# Patient Record
Sex: Male | Born: 1945 | Race: White | Hispanic: No | Marital: Married | Smoking: Former smoker
Health system: Southern US, Community
[De-identification: ages and names within clinical notes are randomized; demographics above are authoritative.]

## PROBLEM LIST (undated history)

## (undated) ENCOUNTER — Emergency Department (HOSPITAL_COMMUNITY): Admission: EM | Discharge status: Against Medical Advice | Payer: Medicare Other

## (undated) DIAGNOSIS — M4712 Other spondylosis with myelopathy, cervical region: Secondary | ICD-10-CM

## (undated) DIAGNOSIS — Z7901 Long term (current) use of anticoagulants: Secondary | ICD-10-CM

## (undated) DIAGNOSIS — I2510 Atherosclerotic heart disease of native coronary artery without angina pectoris: Secondary | ICD-10-CM

## (undated) DIAGNOSIS — I219 Acute myocardial infarction, unspecified: Secondary | ICD-10-CM

## (undated) DIAGNOSIS — T8859XA Other complications of anesthesia, initial encounter: Secondary | ICD-10-CM

## (undated) DIAGNOSIS — I10 Essential (primary) hypertension: Secondary | ICD-10-CM

## (undated) DIAGNOSIS — G893 Neoplasm related pain (acute) (chronic): Secondary | ICD-10-CM

## (undated) DIAGNOSIS — M1990 Unspecified osteoarthritis, unspecified site: Secondary | ICD-10-CM

## (undated) DIAGNOSIS — I495 Sick sinus syndrome: Secondary | ICD-10-CM

## (undated) DIAGNOSIS — Z96641 Presence of right artificial hip joint: Secondary | ICD-10-CM

## (undated) DIAGNOSIS — C9110 Chronic lymphocytic leukemia of B-cell type not having achieved remission: Secondary | ICD-10-CM

## (undated) DIAGNOSIS — I5022 Chronic systolic (congestive) heart failure: Secondary | ICD-10-CM

## (undated) DIAGNOSIS — Z87891 Personal history of nicotine dependence: Secondary | ICD-10-CM

## (undated) DIAGNOSIS — I455 Other specified heart block: Secondary | ICD-10-CM

## (undated) DIAGNOSIS — Z77098 Contact with and (suspected) exposure to other hazardous, chiefly nonmedicinal, chemicals: Secondary | ICD-10-CM

## (undated) DIAGNOSIS — B019 Varicella without complication: Secondary | ICD-10-CM

## (undated) DIAGNOSIS — I255 Ischemic cardiomyopathy: Secondary | ICD-10-CM

## (undated) DIAGNOSIS — G4733 Obstructive sleep apnea (adult) (pediatric): Secondary | ICD-10-CM

## (undated) DIAGNOSIS — Z9221 Personal history of antineoplastic chemotherapy: Secondary | ICD-10-CM

## (undated) DIAGNOSIS — N400 Enlarged prostate without lower urinary tract symptoms: Secondary | ICD-10-CM

## (undated) DIAGNOSIS — Z79899 Other long term (current) drug therapy: Secondary | ICD-10-CM

## (undated) DIAGNOSIS — E7800 Pure hypercholesterolemia, unspecified: Secondary | ICD-10-CM

## (undated) DIAGNOSIS — E785 Hyperlipidemia, unspecified: Secondary | ICD-10-CM

## (undated) DIAGNOSIS — F419 Anxiety disorder, unspecified: Secondary | ICD-10-CM

## (undated) DIAGNOSIS — Z9989 Dependence on other enabling machines and devices: Secondary | ICD-10-CM

## (undated) DIAGNOSIS — I00 Rheumatic fever without heart involvement: Secondary | ICD-10-CM

## (undated) DIAGNOSIS — I639 Cerebral infarction, unspecified: Secondary | ICD-10-CM

## (undated) DIAGNOSIS — I4891 Unspecified atrial fibrillation: Secondary | ICD-10-CM

## (undated) DIAGNOSIS — I499 Cardiac arrhythmia, unspecified: Secondary | ICD-10-CM

## (undated) DIAGNOSIS — K219 Gastro-esophageal reflux disease without esophagitis: Secondary | ICD-10-CM

## (undated) DIAGNOSIS — H9190 Unspecified hearing loss, unspecified ear: Secondary | ICD-10-CM

## (undated) DIAGNOSIS — Z7739 Contact with and (suspected) exposure to other war theater: Secondary | ICD-10-CM

## (undated) DIAGNOSIS — E876 Hypokalemia: Secondary | ICD-10-CM

## (undated) DIAGNOSIS — J449 Chronic obstructive pulmonary disease, unspecified: Secondary | ICD-10-CM

## (undated) DIAGNOSIS — T4145XA Adverse effect of unspecified anesthetic, initial encounter: Secondary | ICD-10-CM

## (undated) DIAGNOSIS — F4310 Post-traumatic stress disorder, unspecified: Secondary | ICD-10-CM

## (undated) DIAGNOSIS — Z950 Presence of cardiac pacemaker: Secondary | ICD-10-CM

## (undated) HISTORY — DX: Ischemic cardiomyopathy: I25.5

## (undated) HISTORY — DX: Chronic systolic (congestive) heart failure: I50.22

## (undated) HISTORY — DX: Unspecified osteoarthritis, unspecified site: M19.90

## (undated) HISTORY — DX: Pure hypercholesterolemia, unspecified: E78.00

## (undated) HISTORY — DX: Cerebral infarction, unspecified: I63.9

## (undated) HISTORY — DX: Sick sinus syndrome: I49.5

## (undated) HISTORY — DX: Hyperlipidemia, unspecified: E78.5

## (undated) HISTORY — PX: ABDOMINAL HERNIA REPAIR: SHX539

## (undated) HISTORY — DX: Chronic lymphocytic leukemia of B-cell type not having achieved remission: C91.10

## (undated) HISTORY — DX: Unspecified atrial fibrillation: I48.91

## (undated) HISTORY — PX: CARDIAC CATHETERIZATION: SHX172

## (undated) HISTORY — DX: Atherosclerotic heart disease of native coronary artery without angina pectoris: I25.10

## (undated) HISTORY — DX: Varicella without complication: B01.9

## (undated) HISTORY — PX: INGUINAL HERNIA REPAIR: SUR1180

## (undated) HISTORY — DX: Neoplasm related pain (acute) (chronic): G89.3

## (undated) HISTORY — DX: Chronic obstructive pulmonary disease, unspecified: J44.9

## (undated) HISTORY — DX: Presence of right artificial hip joint: Z96.641

## (undated) HISTORY — PX: INSERT / REPLACE / REMOVE PACEMAKER: SUR710

## (undated) HISTORY — DX: Rheumatic fever without heart involvement: I00

## (undated) HISTORY — PX: LAPAROSCOPIC CHOLECYSTECTOMY: SUR755

## (undated) HISTORY — DX: Essential (primary) hypertension: I10

## (undated) MED FILL — Venetoclax Tab 100 MG: ORAL | Fill #0 | Status: CN

## (undated) MED FILL — Medication: Fill #0 | Status: CN

---
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
## 1966-07-21 HISTORY — PX: FOREIGN BODY REMOVAL: SHX962

## 1985-06-21 HISTORY — PX: APPENDECTOMY: SHX54

## 2005-01-20 ENCOUNTER — Ambulatory Visit: Payer: Self-pay | Admitting: Surgery

## 2006-05-27 ENCOUNTER — Ambulatory Visit: Payer: Self-pay | Admitting: General Practice

## 2006-05-27 IMAGING — US ABDOMEN ULTRASOUND
1 series · 17 of 25 positions shown · non-contrast
Comparison: none

REASON FOR EXAM: epigastric pain
COMMENTS:

[Series 1: abdomen ultrasound · 17 of 63 slices shown]
[im 1/63]
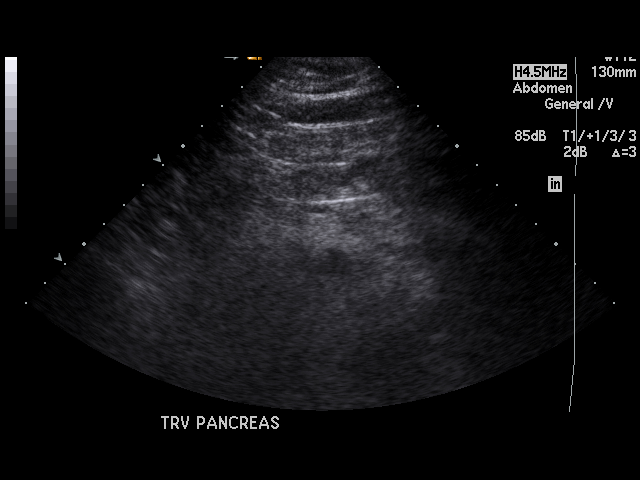
[im 6/63]
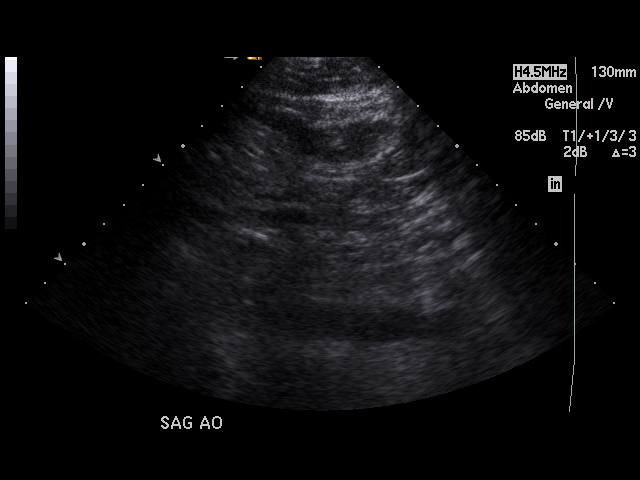
[im 8/63]
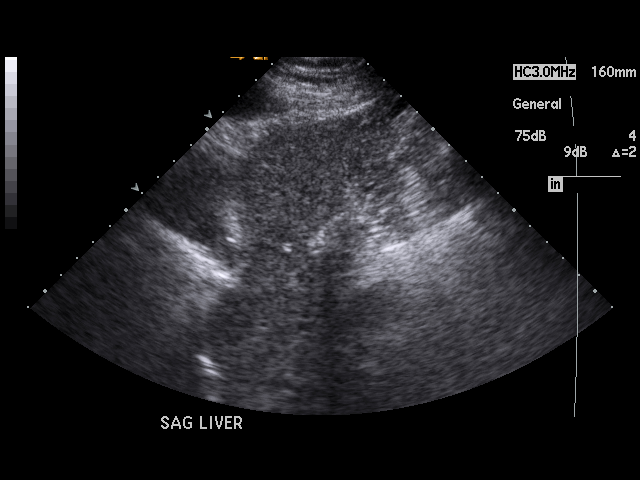
[im 13/63]
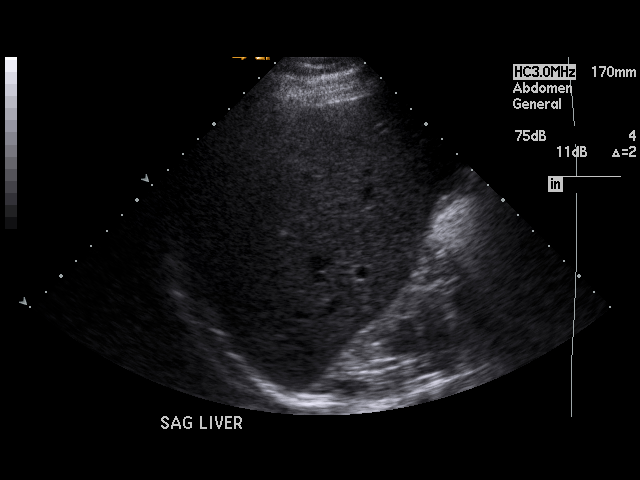
[im 16/63]
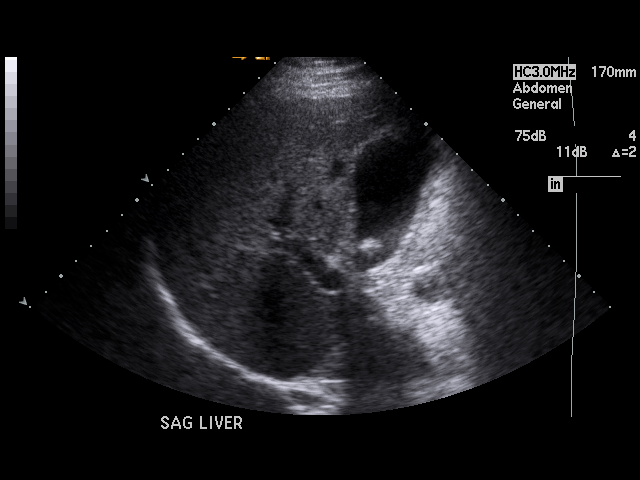
[im 21/63]
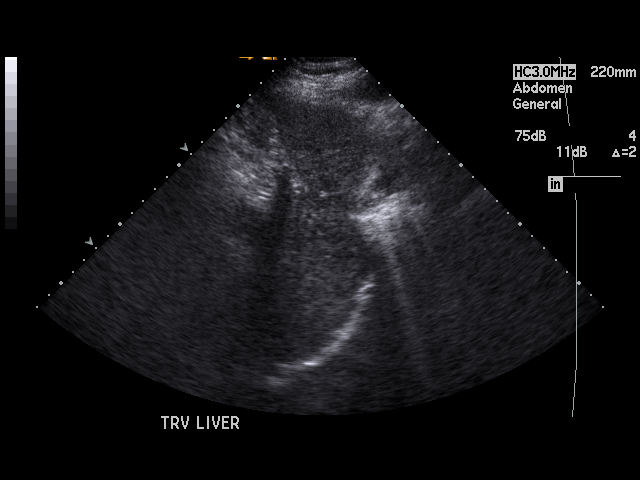
[im 24/63]
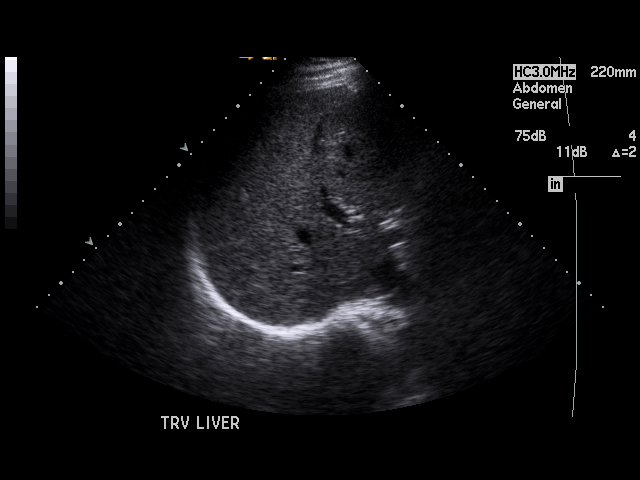
[im 29/63]
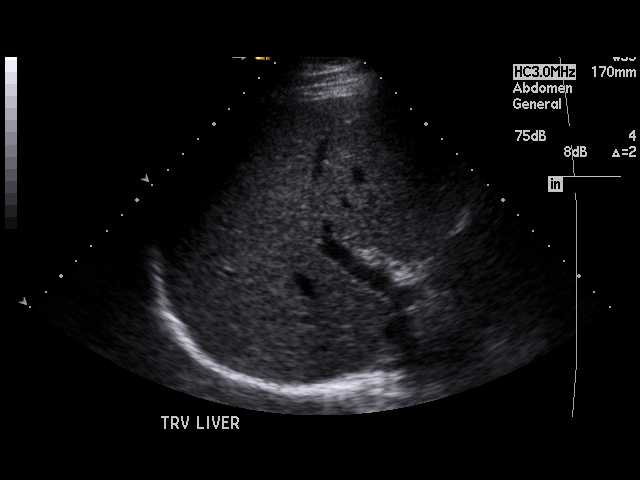
[im 32/63]
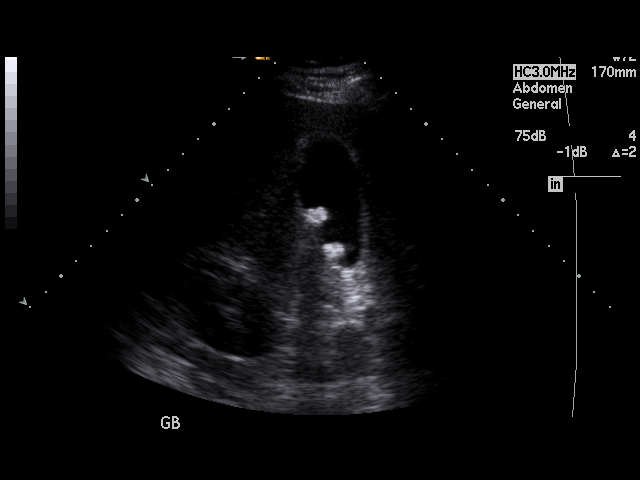
[im 34/63]
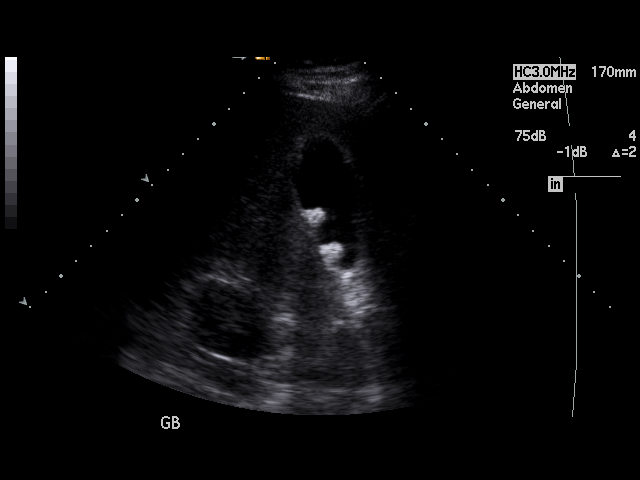
[im 39/63]
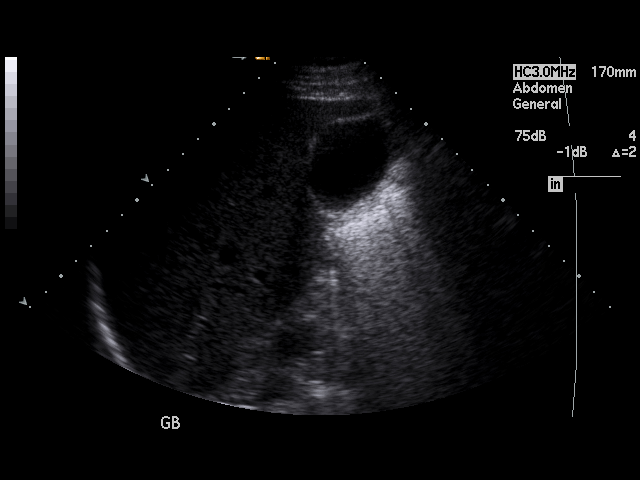
[im 42/63]
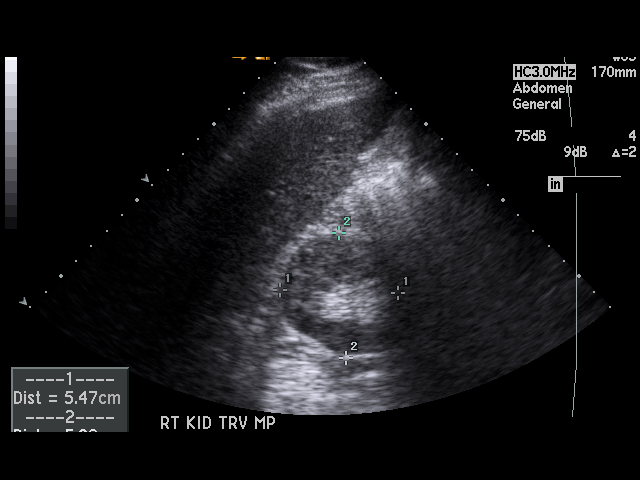
[im 47/63]
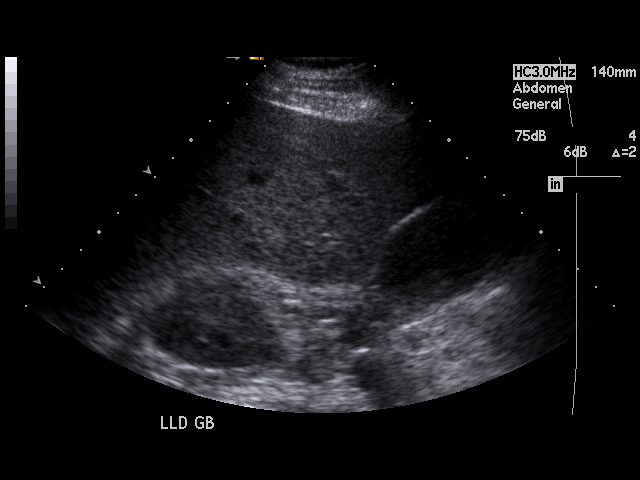
[im 50/63]
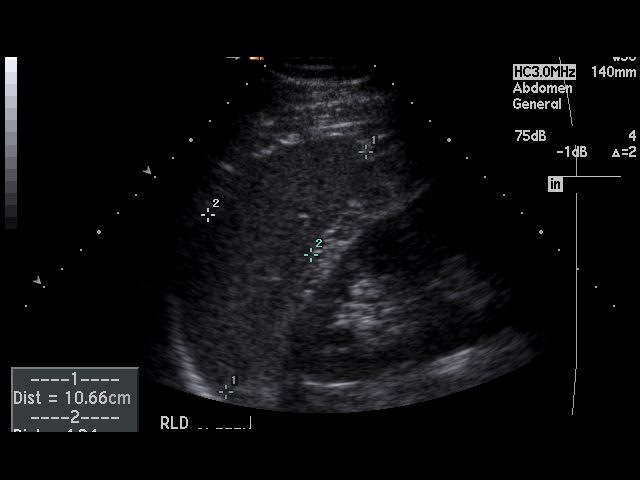
[im 55/63]
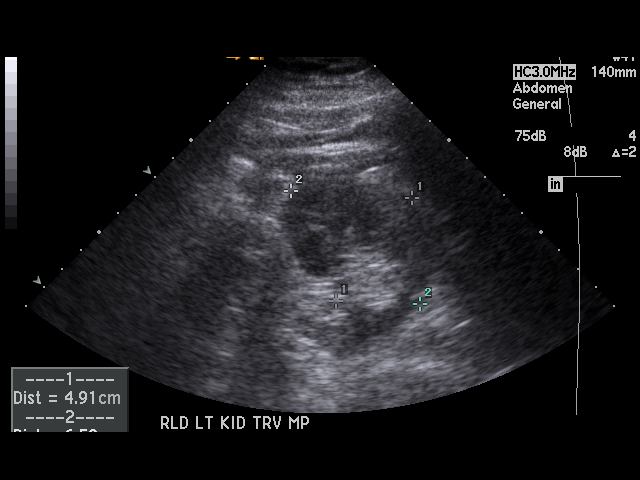
[im 57/63]
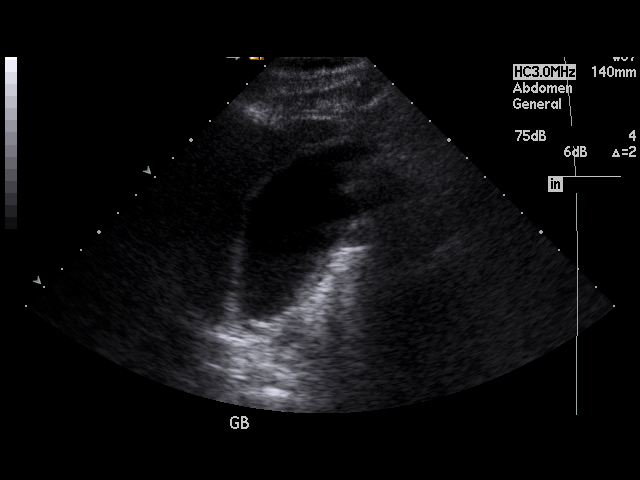
[im 63/63]
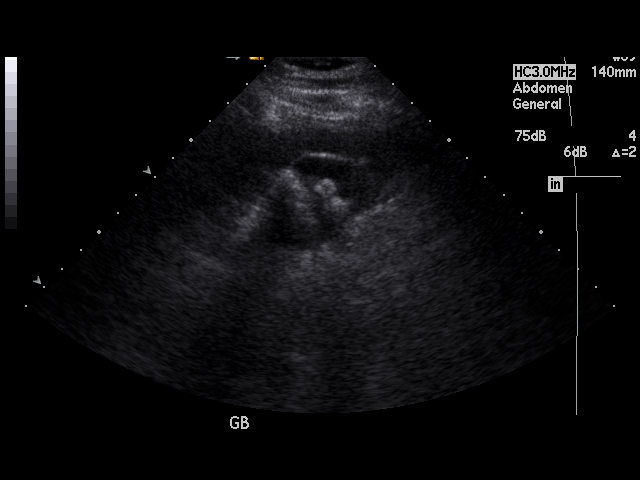

[17 of 25 positions shown; findings below may reference images not displayed]

PROCEDURE:     US  - US ABDOMEN GENERAL SURVEY  - [DATE]  [DATE]

RESULT:     The liver and spleen are normal in appearance. The pancreas is
not visualized adequately for evaluation on this exam. There are multiple
echo densities observed in the gallbladder compatible with mobile
gallstones. There is no thickening of the gallbladder wall. The common bile
duct measures 3.3 mm in diameter, which is within normal limits.  The
kidneys show no hydronephrosis. There is no ascites.
IMPRESSION: 1)Cholelithiasis.

2)The pancreas is not visualized adequate for evaluation on this exam.

## 2006-06-26 ENCOUNTER — Ambulatory Visit: Payer: Self-pay | Admitting: General Surgery

## 2008-07-21 DIAGNOSIS — I219 Acute myocardial infarction, unspecified: Secondary | ICD-10-CM

## 2008-07-21 HISTORY — DX: Acute myocardial infarction, unspecified: I21.9

## 2009-04-20 HISTORY — PX: CORONARY ARTERY BYPASS GRAFT: SHX141

## 2009-05-07 ENCOUNTER — Inpatient Hospital Stay: Payer: Self-pay | Admitting: Cardiovascular Disease

## 2009-05-07 IMAGING — CR DG CHEST 1V PORT
1 series · 1 of 1 positions shown · non-contrast
Comparison: none

REASON FOR EXAM: cp
COMMENTS:

PROCEDURE:     DXR - DXR PORTABLE CHEST SINGLE VIEW  - [DATE]  [DATE]
RESULT:     The heart size is normal. Monitoring electrodes are present.

[view not recorded]
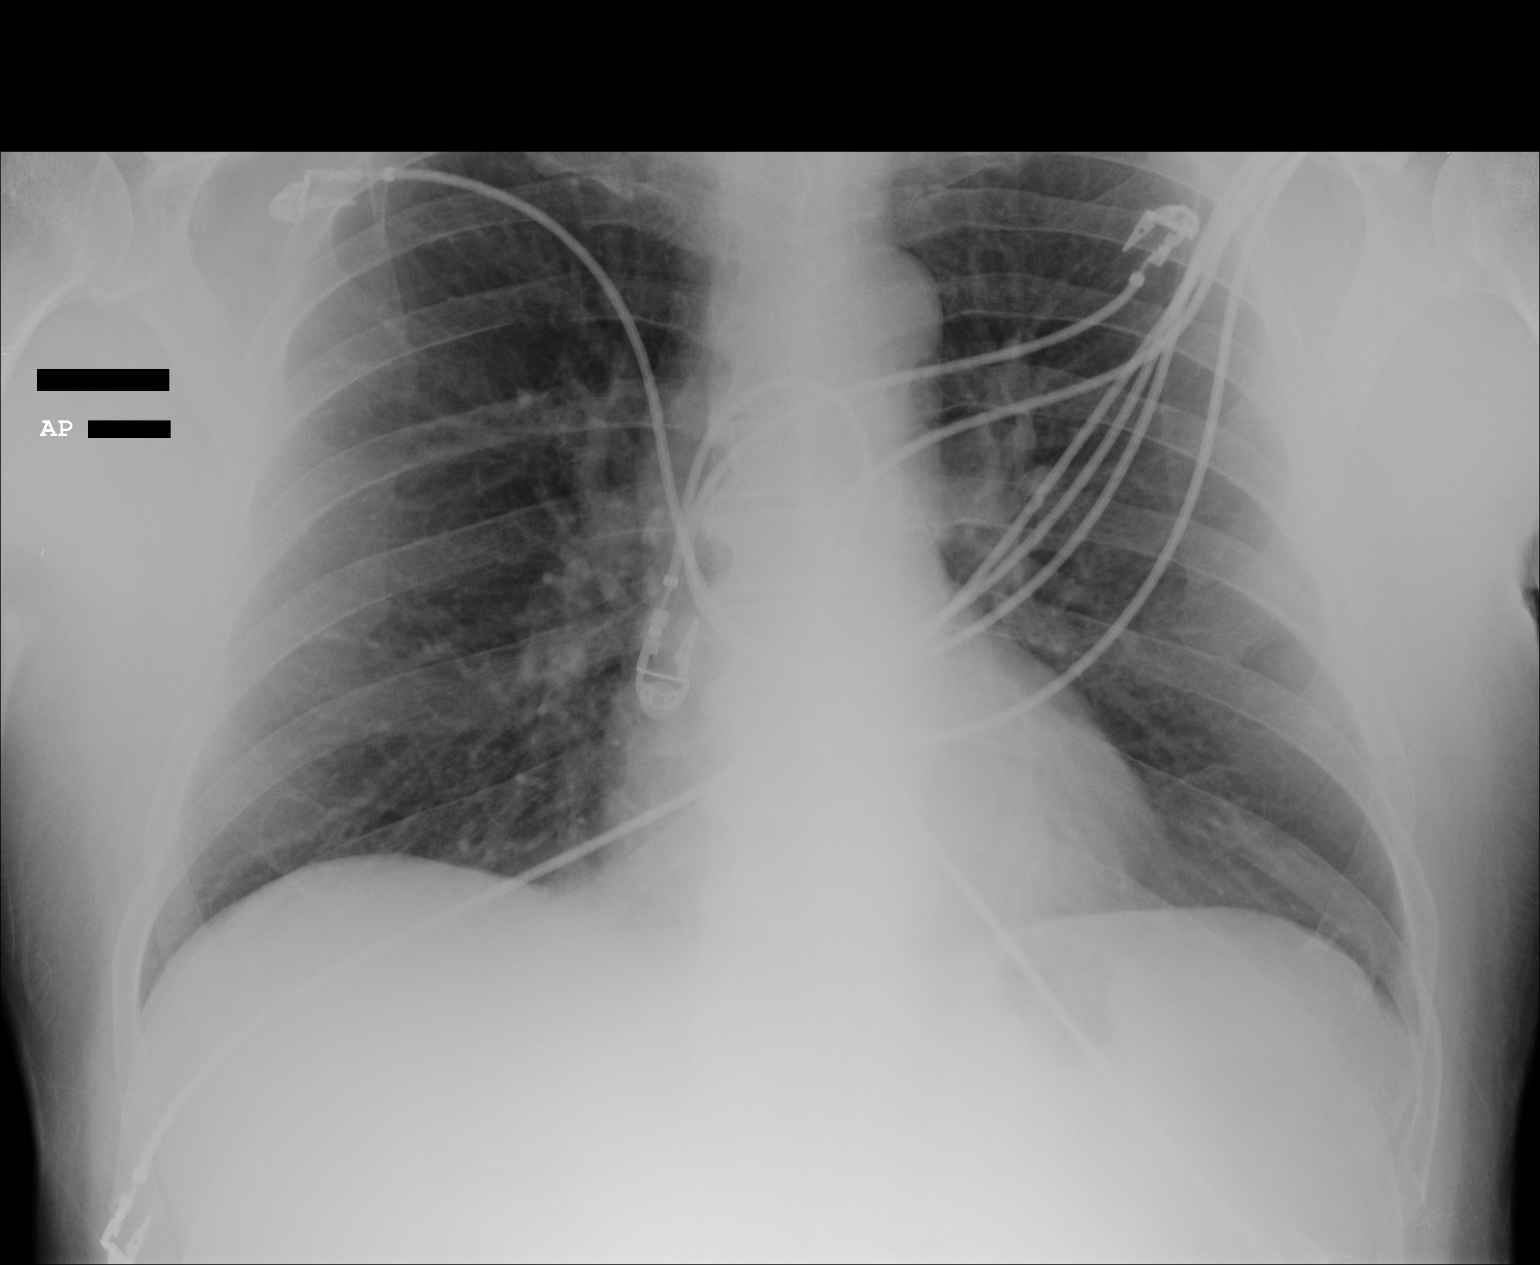

[1 of 1 positions shown; findings below may reference images not displayed]

IMPRESSION: No acute changes are identified.

## 2009-05-08 ENCOUNTER — Encounter: Payer: Self-pay | Admitting: Cardiothoracic Surgery

## 2009-05-08 ENCOUNTER — Ambulatory Visit: Payer: Self-pay | Admitting: Cardiology

## 2009-05-08 ENCOUNTER — Ambulatory Visit: Payer: Self-pay | Admitting: Cardiothoracic Surgery

## 2009-05-08 ENCOUNTER — Inpatient Hospital Stay (HOSPITAL_COMMUNITY): Admission: AD | Admit: 2009-05-08 | Discharge: 2009-05-16 | Payer: Self-pay | Admitting: Cardiothoracic Surgery

## 2009-05-08 HISTORY — PX: LEFT HEART CATH AND CORONARY ANGIOGRAPHY: CATH118249

## 2009-05-08 IMAGING — CR DG CHEST 2V
2 series · 2 of 2 positions shown · non-contrast
Comparison: None

CLINICAL DATA: Coronary artery disease, preop

CHEST - 2 VIEW

[w chest pa]
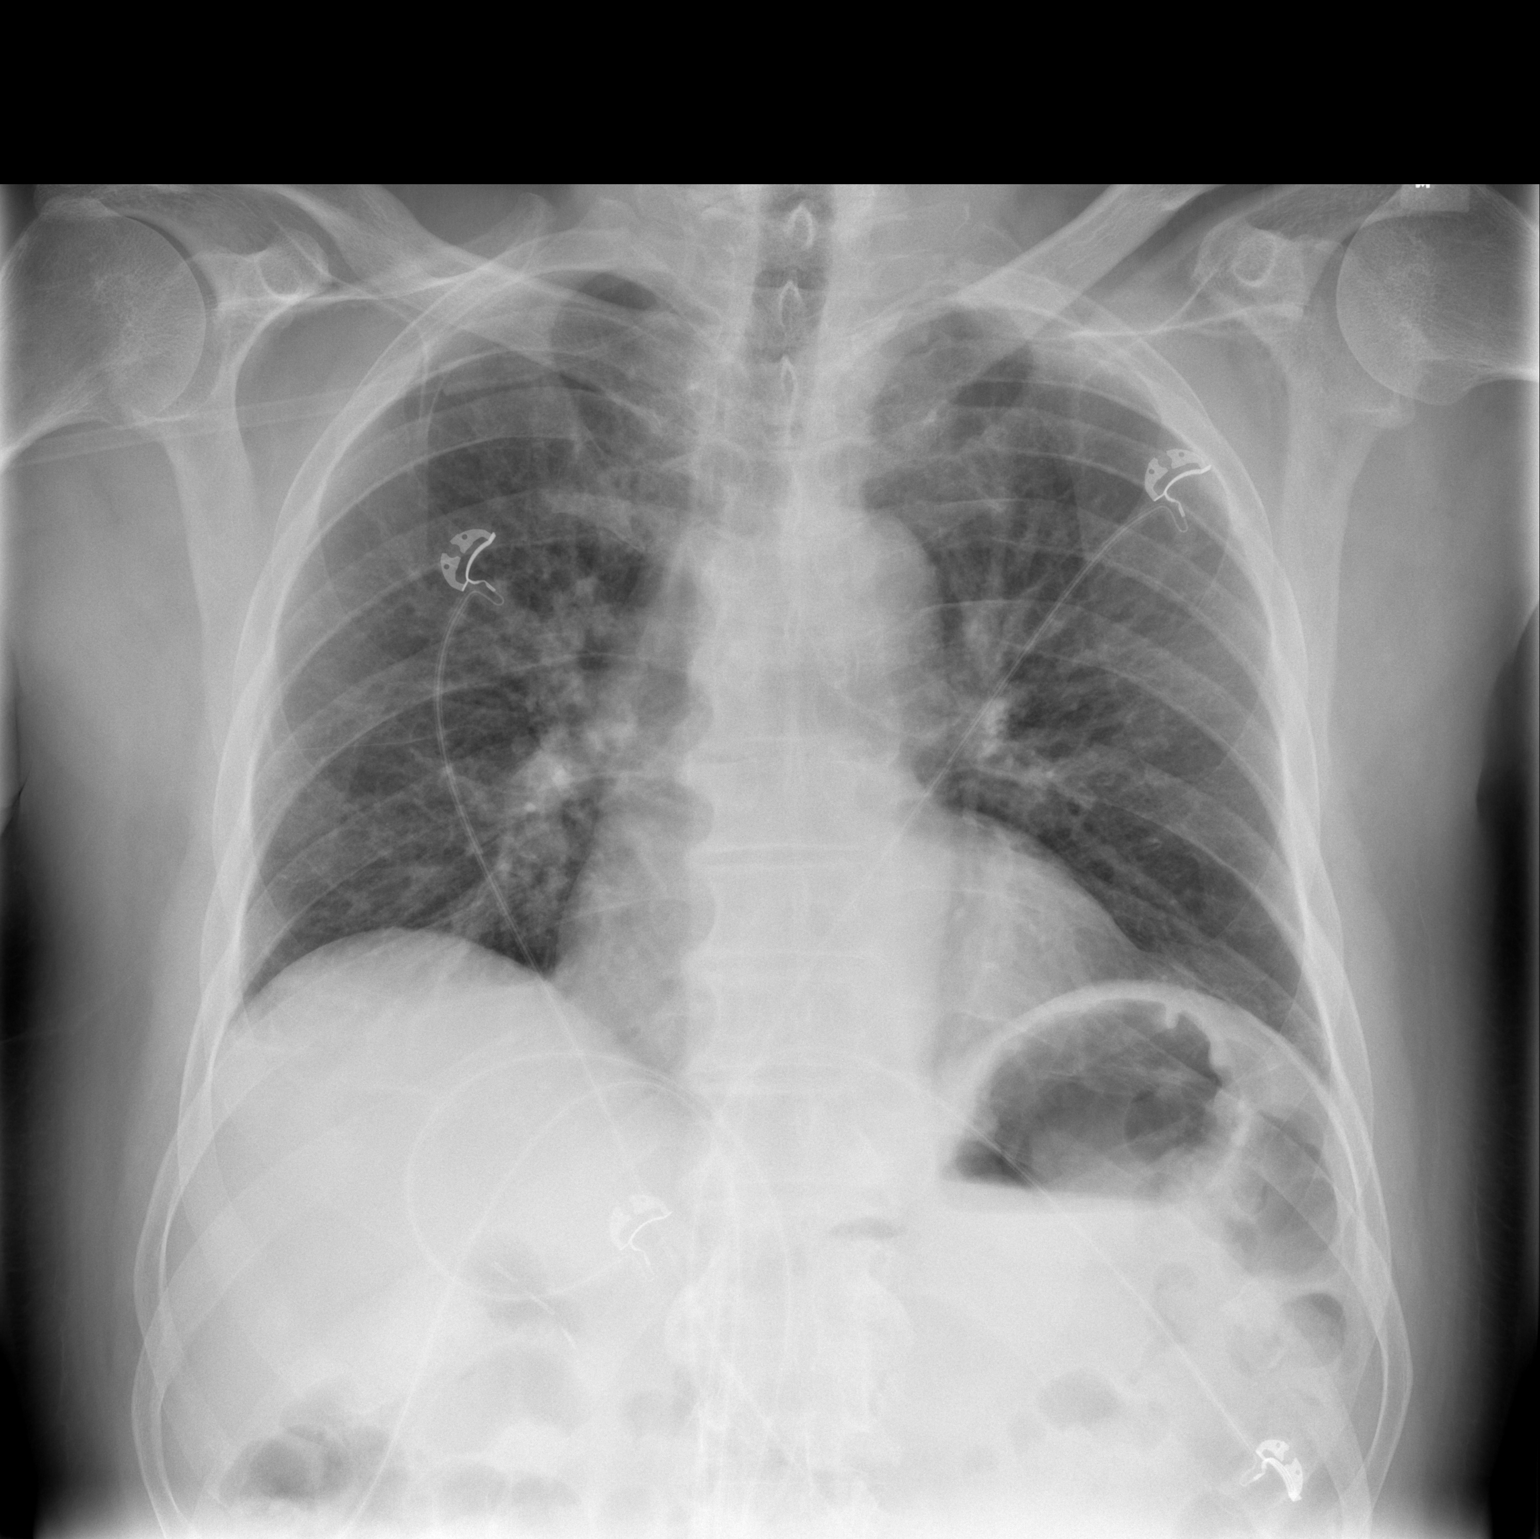

[w chest lat]
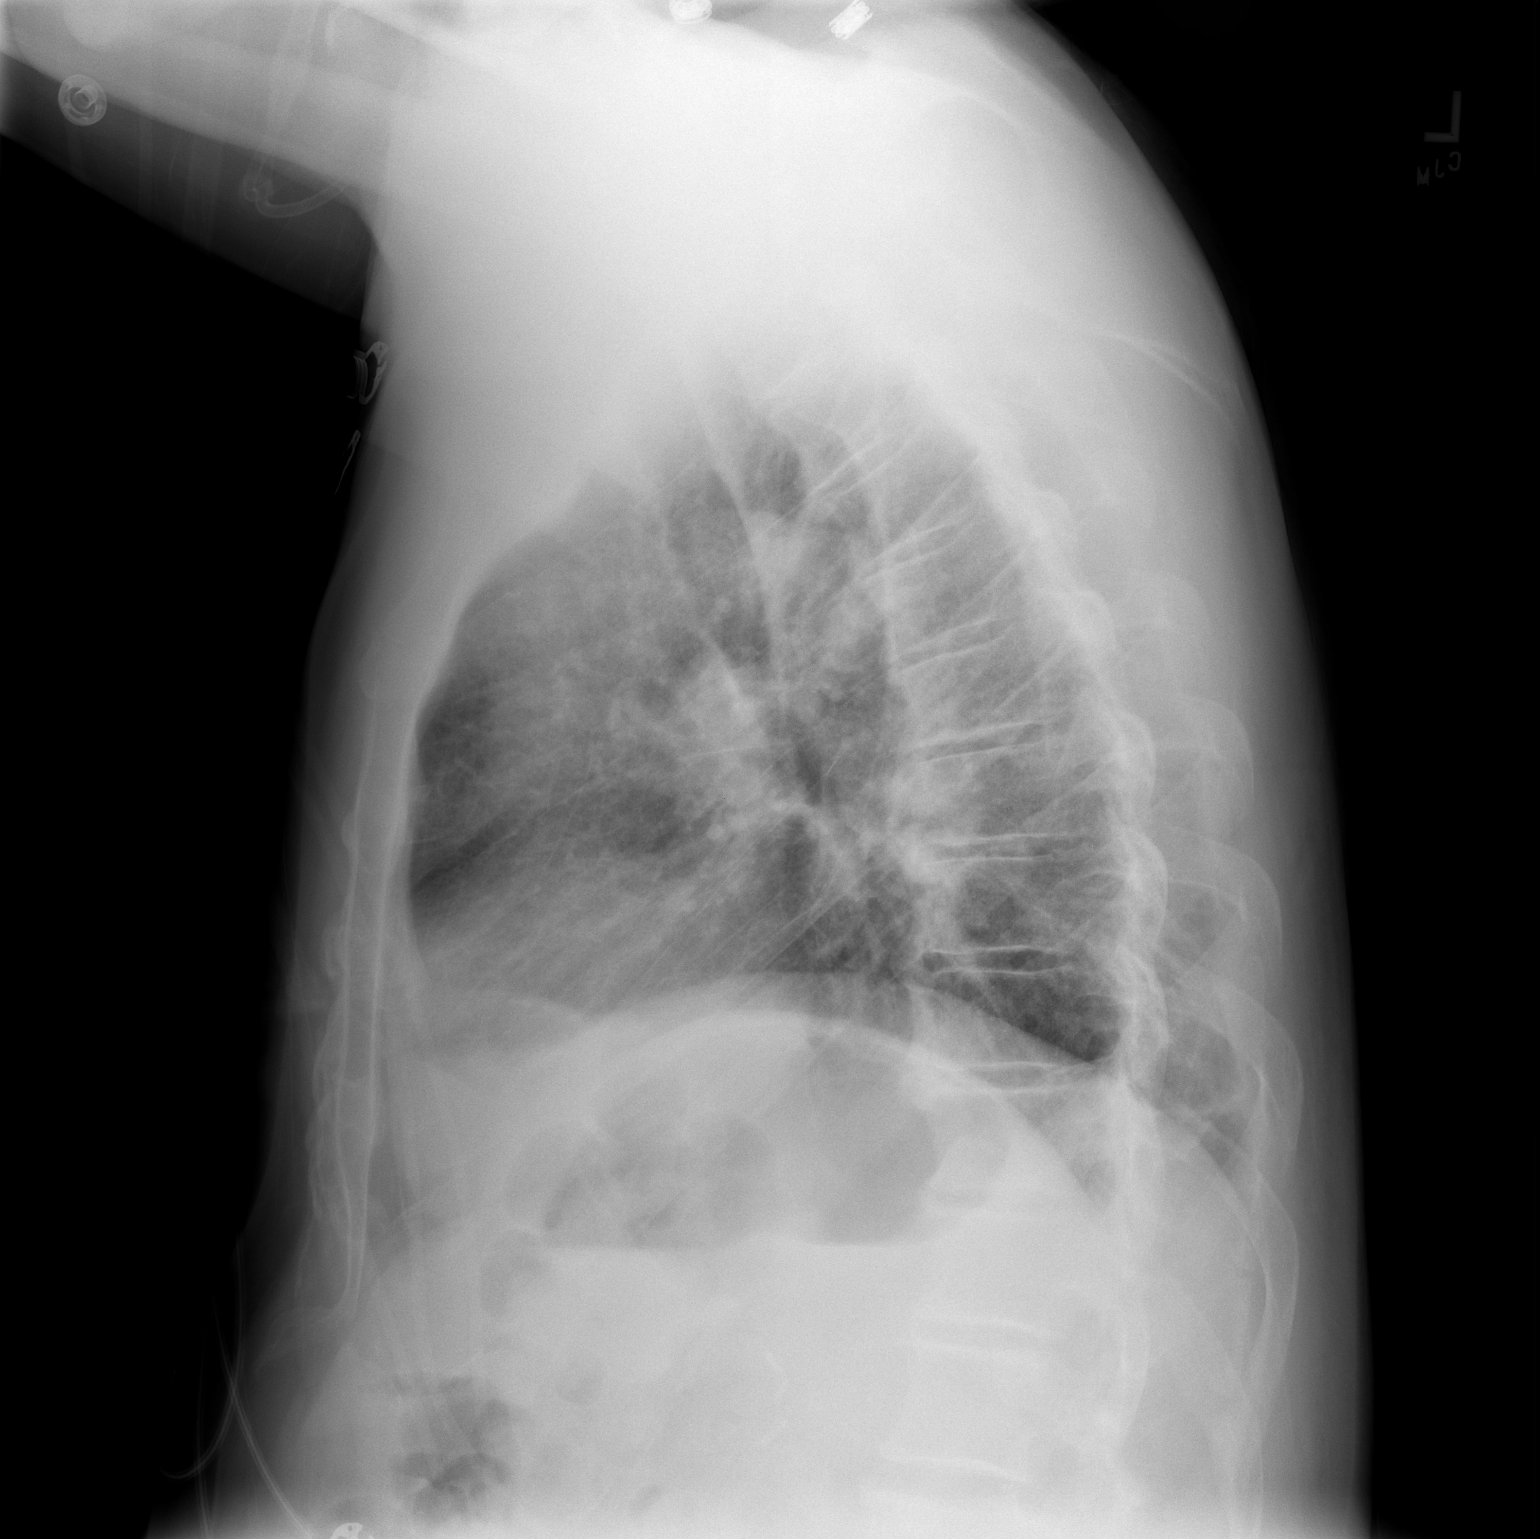

[2 of 2 positions shown; findings below may reference images not displayed]

FINDINGS: Low lung volumes.  No focal infiltrate or overt edema.
No effusion.  Heart size normal.  Minimal spurring in the mid
thoracic spine.  Vascular clips in the right upper abdomen.
IMPRESSION: Low volumes.  No acute disease.

## 2009-05-09 DIAGNOSIS — Z951 Presence of aortocoronary bypass graft: Secondary | ICD-10-CM

## 2009-05-09 HISTORY — DX: Presence of aortocoronary bypass graft: Z95.1

## 2009-05-09 HISTORY — PX: CORONARY ARTERY BYPASS GRAFT: SHX141

## 2009-05-09 IMAGING — CR DG CHEST 1V PORT
1 series · 1 of 1 positions shown · non-contrast
Comparison: [DATE]

CLINICAL DATA: Post CABG

PORTABLE CHEST - 1 VIEW

[view not recorded]
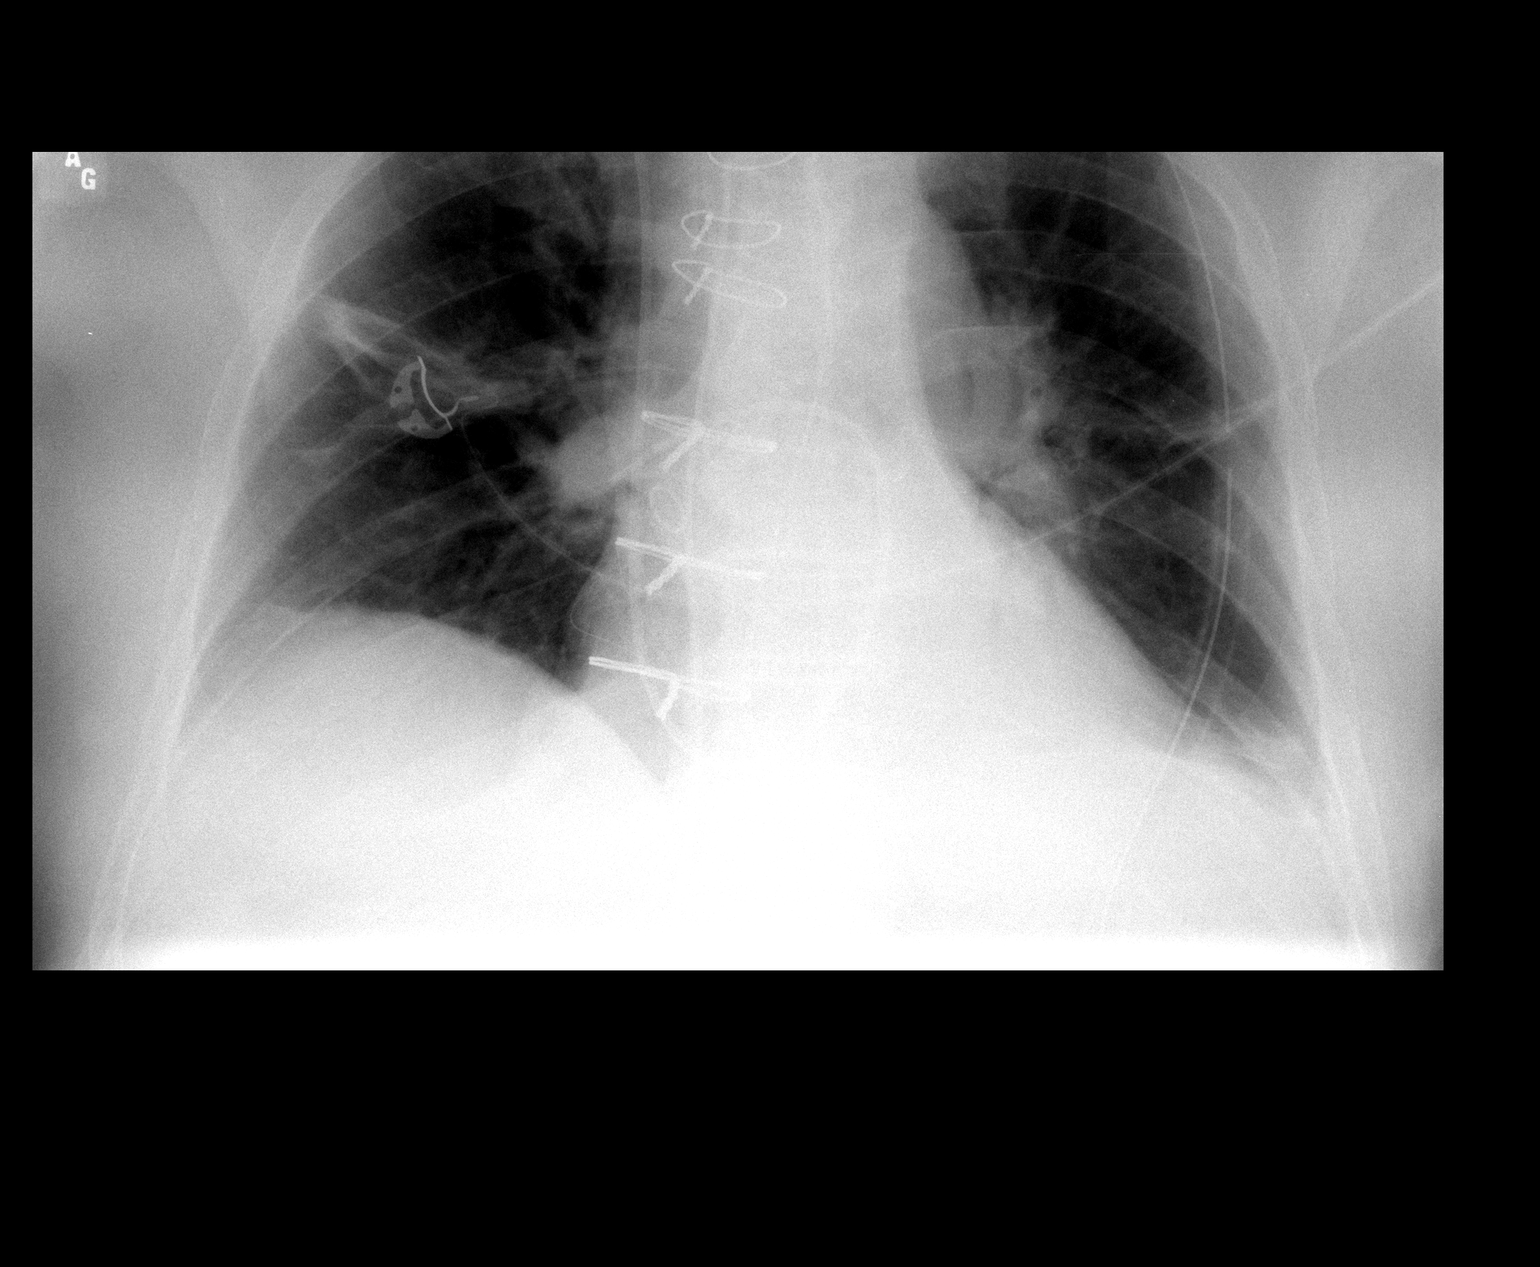

[1 of 1 positions shown; findings below may reference images not displayed]

FINDINGS: The patient is status post CABG.  No pulmonary edema.
Linear atelectasis noted right perihilar and left base.  There is a
left chest tube in place.  No diagnostic pneumothorax.  There is a
right IJ Swan-Ganz catheter with tip in the region of the right
pulmonary artery.  NG tube in place noted.  There is a endotracheal
tube in place with tip 4.5 cm above the carina. Central mild
vascular congestion.
IMPRESSION: Linear atelectasis noted right perihilar and left base.  There is
a left chest tube in place.  No diagnostic pneumothorax.  There is
a right IJ Swan-Ganz catheter with tip in the region of the right
pulmonary artery.  NG tube in place noted.  There is a endotracheal
tube in place with tip 4.5 cm above the carina.No acute infiltrate
or edema. Central mild vascular congestion.

## 2009-05-10 ENCOUNTER — Encounter: Payer: Self-pay | Admitting: Cardiovascular Disease

## 2009-05-10 IMAGING — CR DG CHEST 1V PORT
1 series · 1 of 1 positions shown · non-contrast
Comparison: [DATE]

CLINICAL DATA: CABG.

PORTABLE CHEST - 1 VIEW

[AP]
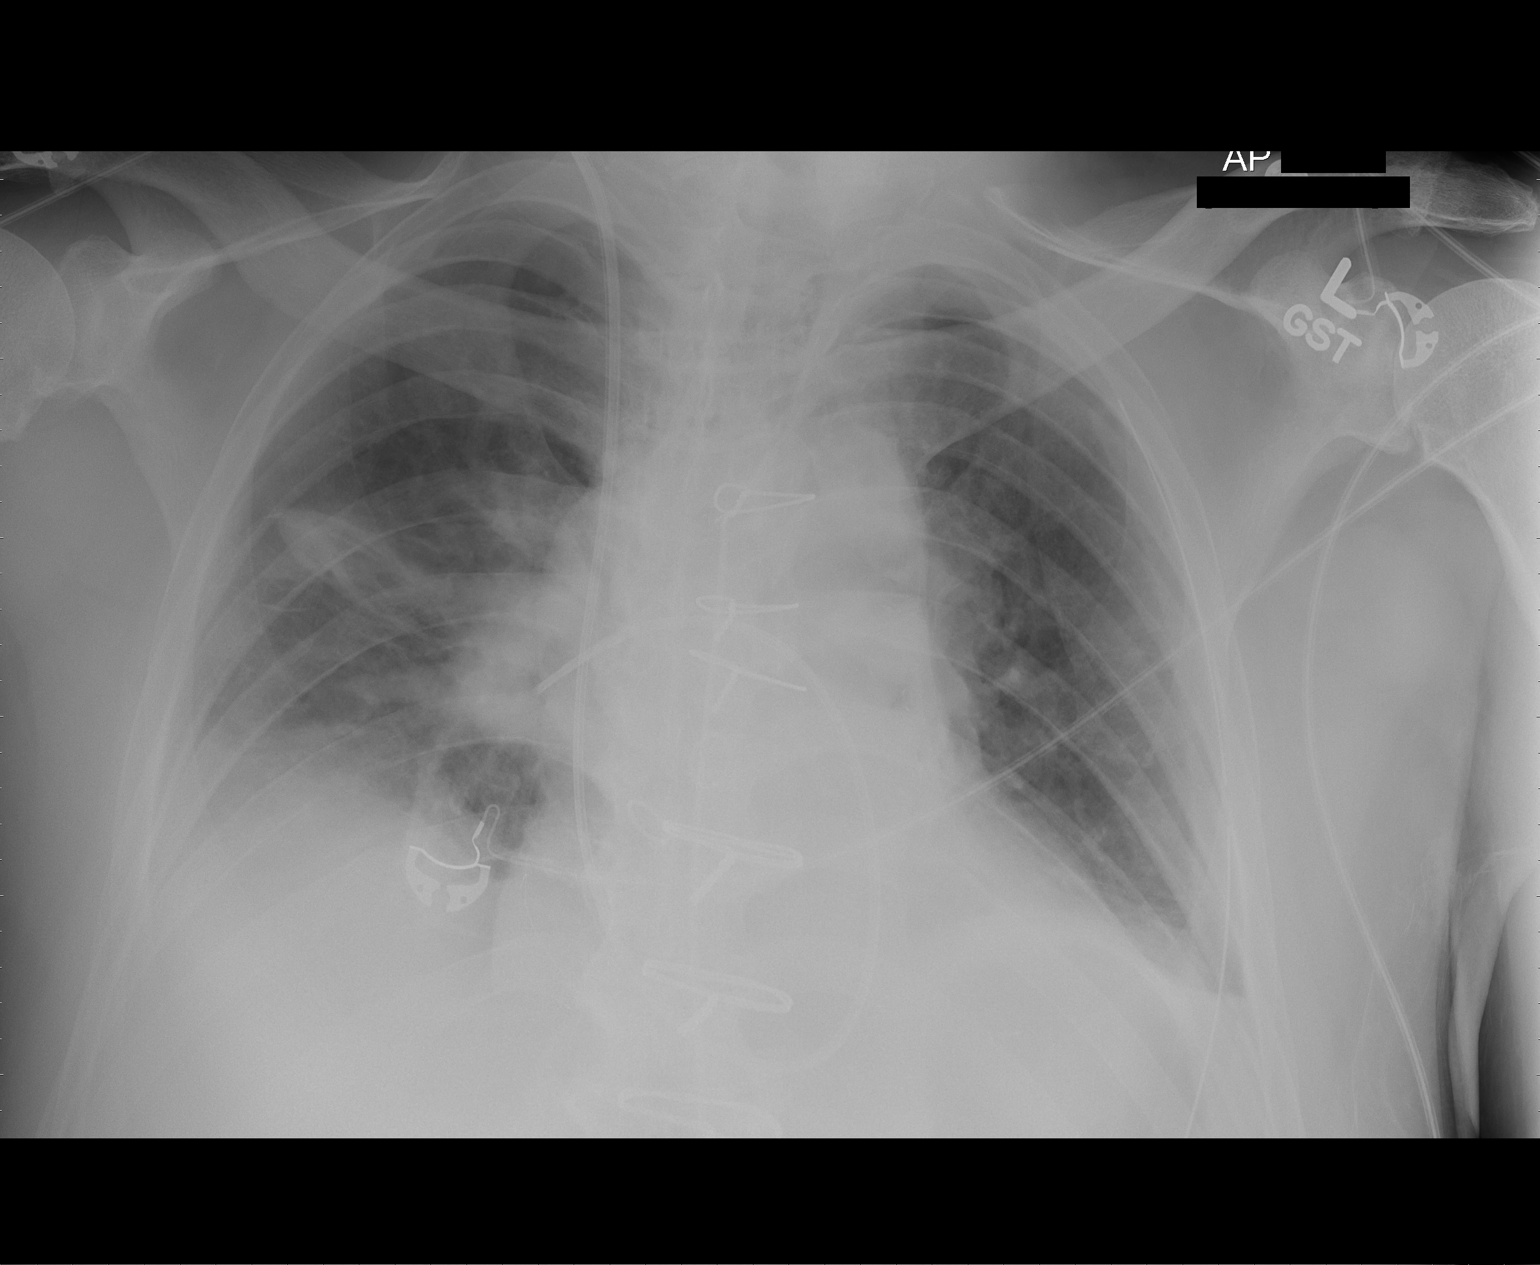

[1 of 1 positions shown; findings below may reference images not displayed]

FINDINGS: Cardiomegaly and evidence of CABG identified.
An endotracheal tube and NG tube have been removed.
Increased right lower lung atelectasis is present.
Mediastinal and thoracostomy tubes are stable.
A right IJ Swan-Ganz catheter is unchanged with tip overlying the
right interlobar pulmonary artery.
Left basilar atelectasis is stable.
There is no evidence of pneumothorax.
IMPRESSION: Endotracheal tube and NG tube removal with increasing right basilar
atelectasis.

Otherwise stable chest - no evidence of pneumothorax.

## 2009-05-11 IMAGING — CR DG CHEST 1V PORT
1 series · 1 of 1 positions shown · non-contrast
Comparison: [DATE]

CLINICAL DATA: Coronary artery disease.  Status post CABG.

PORTABLE CHEST - 1 VIEW

[AP]
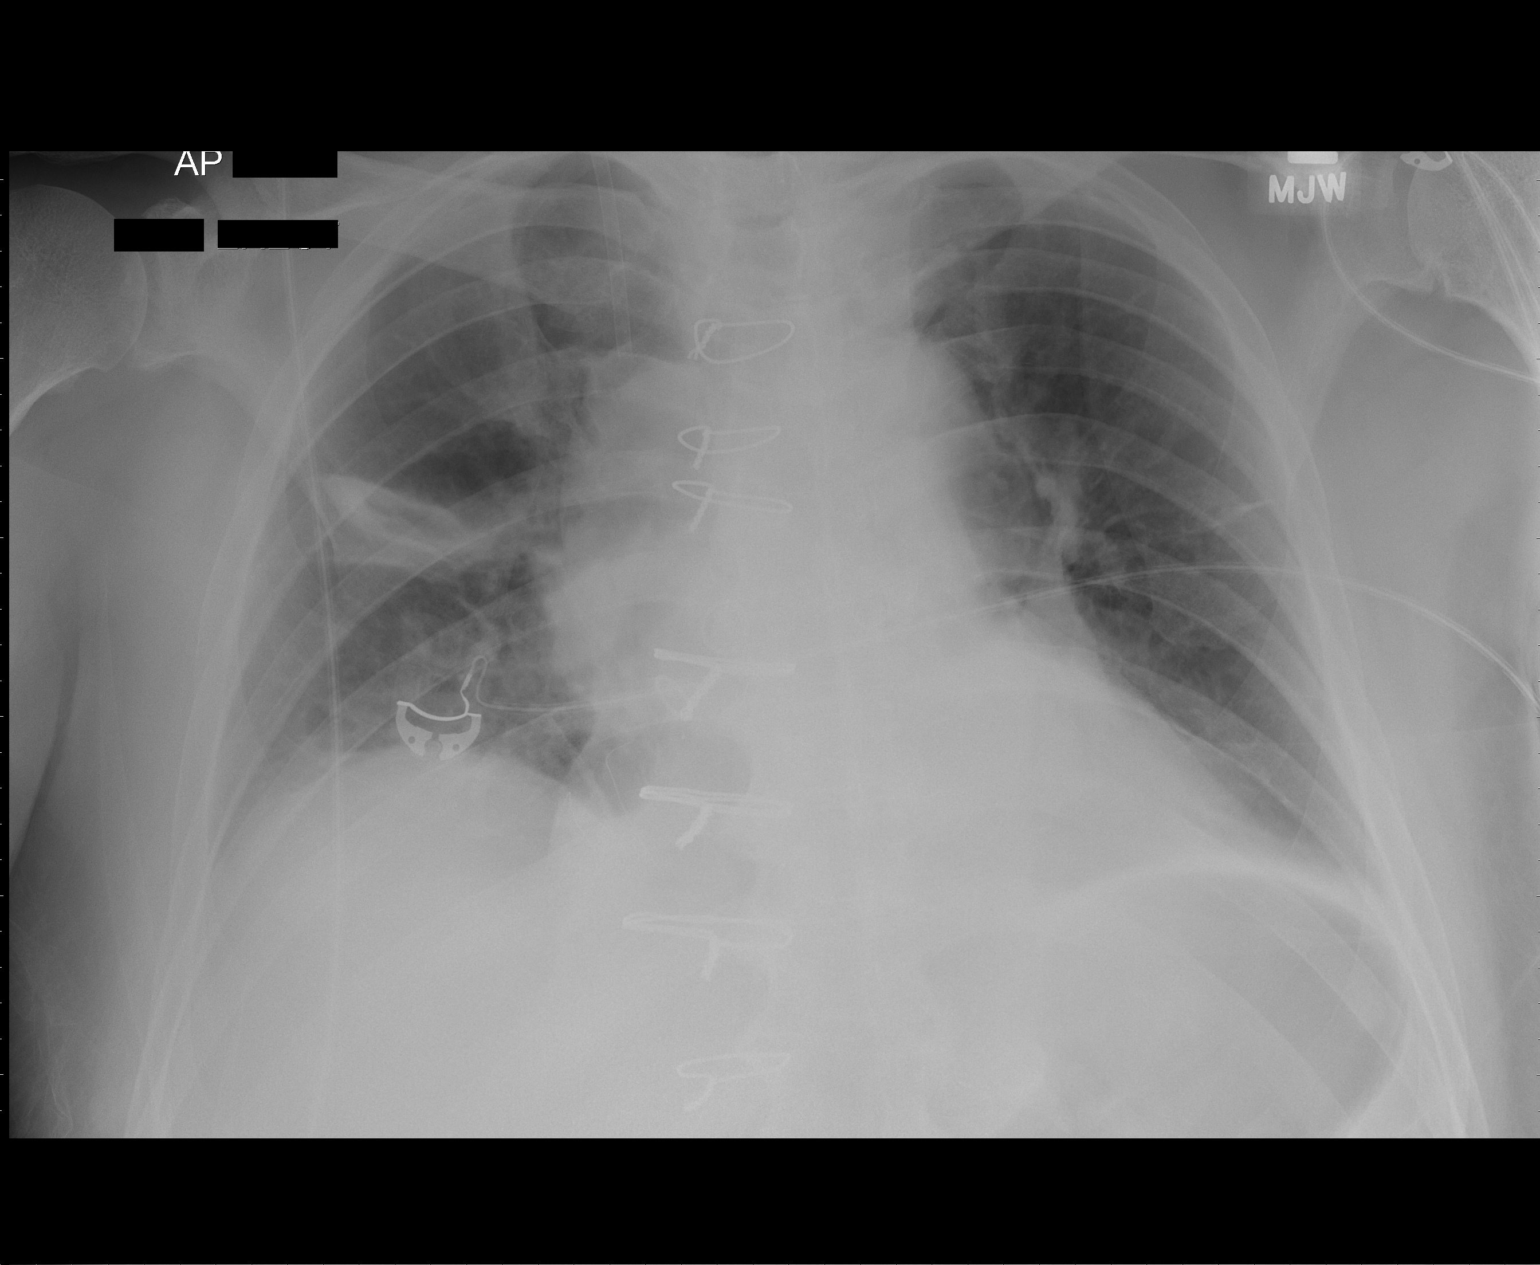

[1 of 1 positions shown; findings below may reference images not displayed]

FINDINGS: A Swan-Ganz catheter has been removed.  A right IJ sheath
remains.  No other lines or tubes are present.  The left-sided
chest tube has been removed.  There is no pneumothorax.  Lung
volumes remain low.  The heart is enlarged.  Bibasilar atelectasis
is not significantly changed.
IMPRESSION: 1.  Interval removal of Swan-Ganz catheter and left-sided chest
tube.  There is no significant pneumothorax.
2.  Persistent low lung volumes and bibasilar atelectasis.

## 2009-05-12 IMAGING — CR DG CHEST 1V PORT
1 series · 1 of 1 positions shown · non-contrast
Comparison: Portable exam [45] hours compared to [DATE]

CLINICAL DATA: Shortness of breath, decreased oxygen saturation,
cough, tachycardia, status post CABG

PORTABLE CHEST - 1 VIEW

[view not recorded]
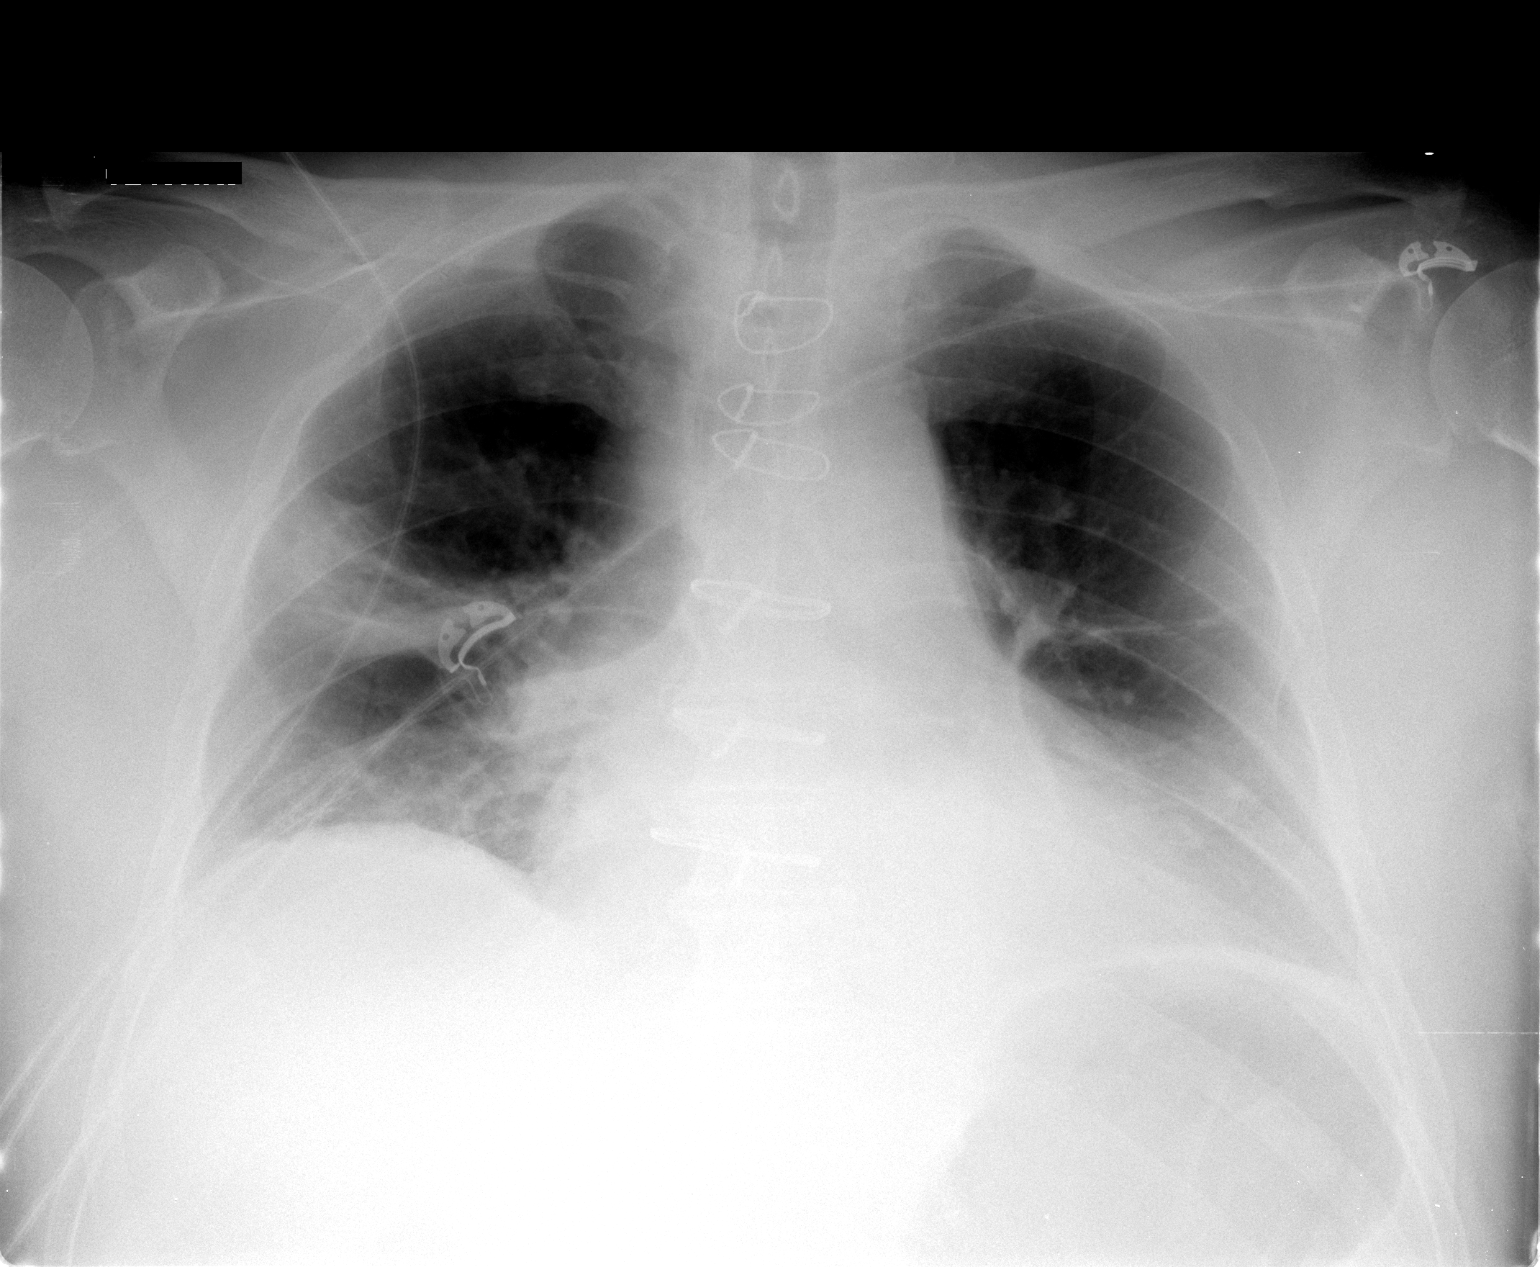

[1 of 1 positions shown; findings below may reference images not displayed]

FINDINGS: Mild cardiac enlargement status post CABG.
Low lung volumes with plates and atelectasis in the mid-to-lower
lungs bilaterally, greater on the right.
Remaining lungs clear.
No definite pleural effusion or pneumothorax.
IMPRESSION: Status post CABG.
Scattered plates of atelectasis in the mid-to-lower lungs
bilaterally without definite acute infiltrate.
Little interval change.

## 2009-05-13 IMAGING — CR DG CHEST 2V
2 series · 2 of 2 positions shown · non-contrast
Comparison: [DATE]

CLINICAL DATA: CAD.  Follow-up.

CHEST - 2 VIEW

[w chest pa]
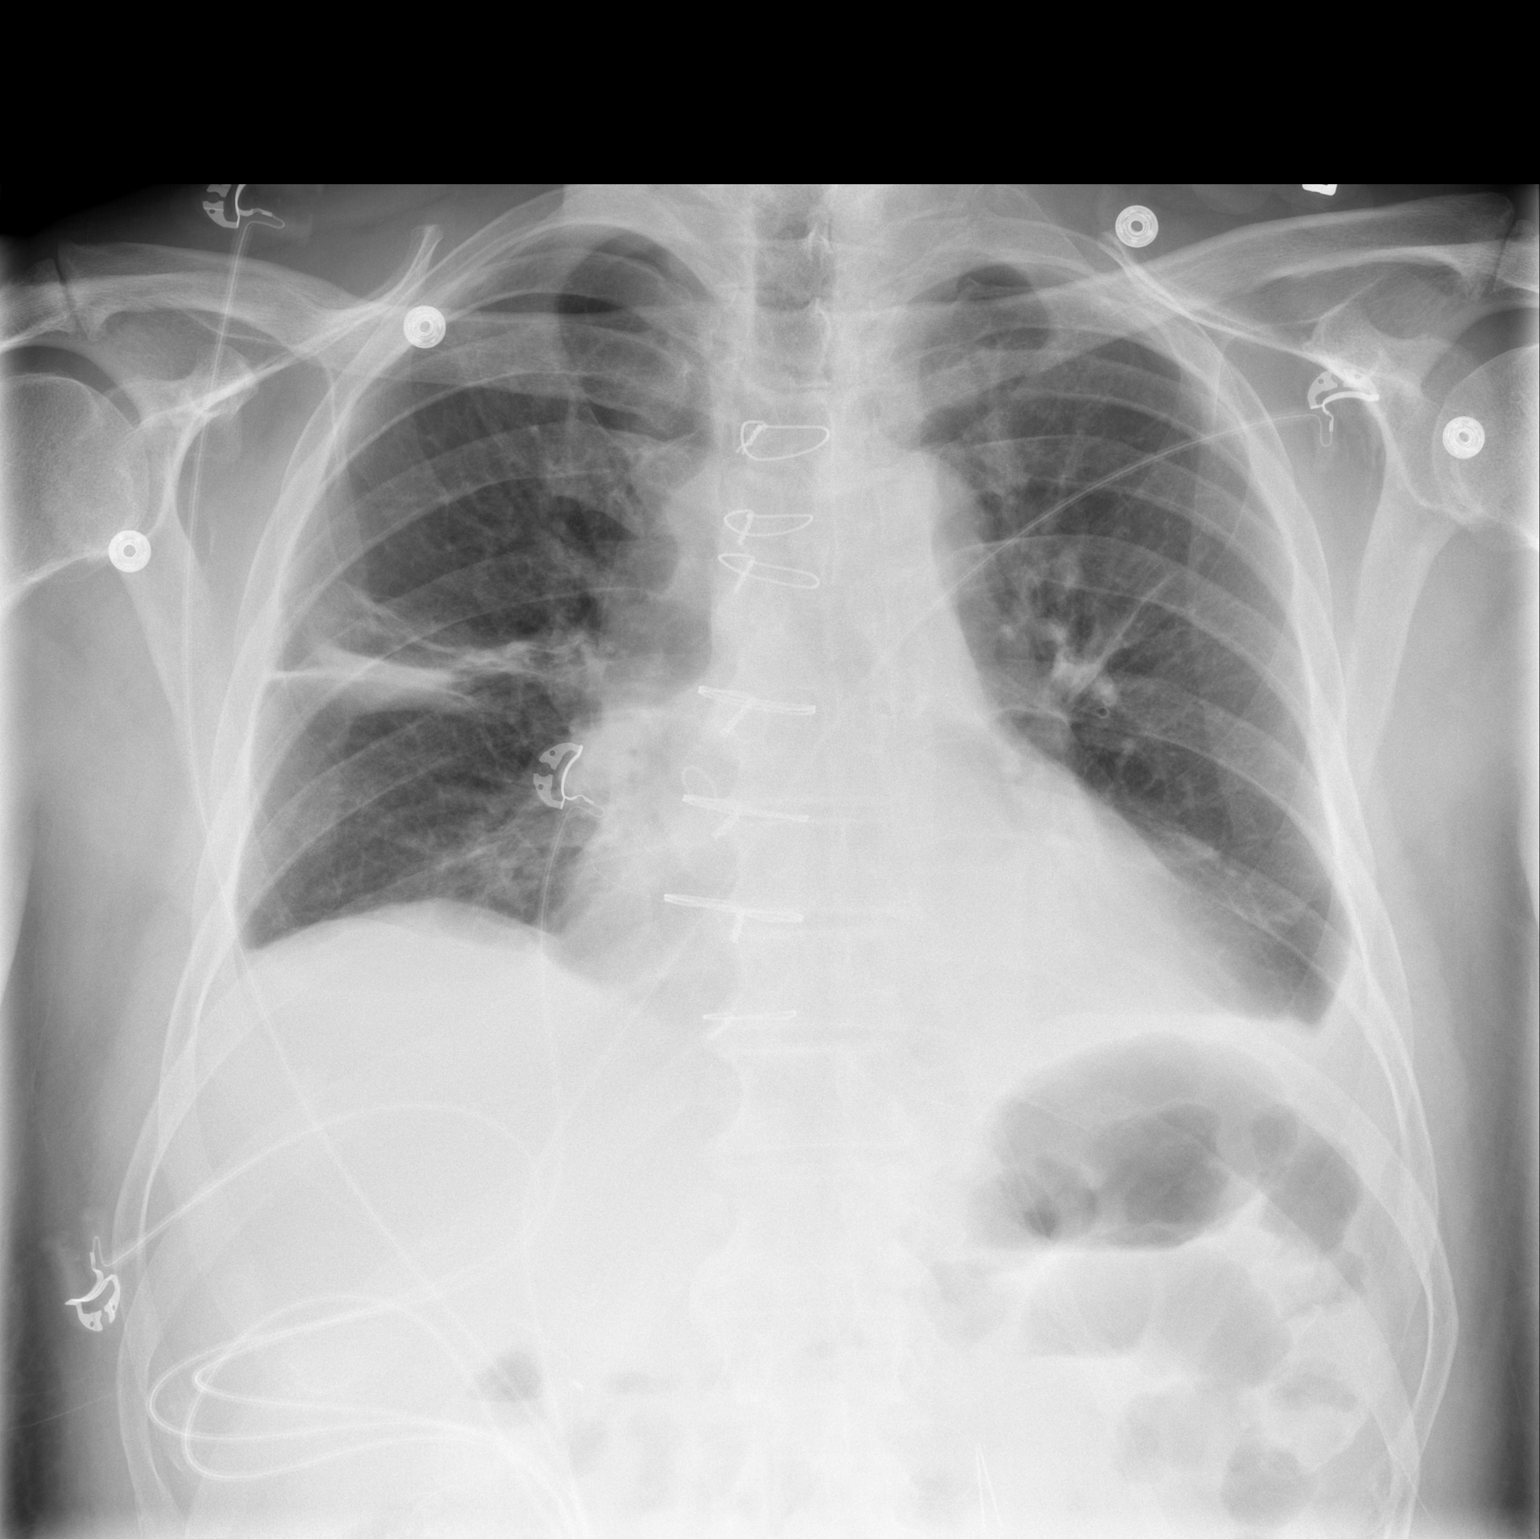

[w chest lat]
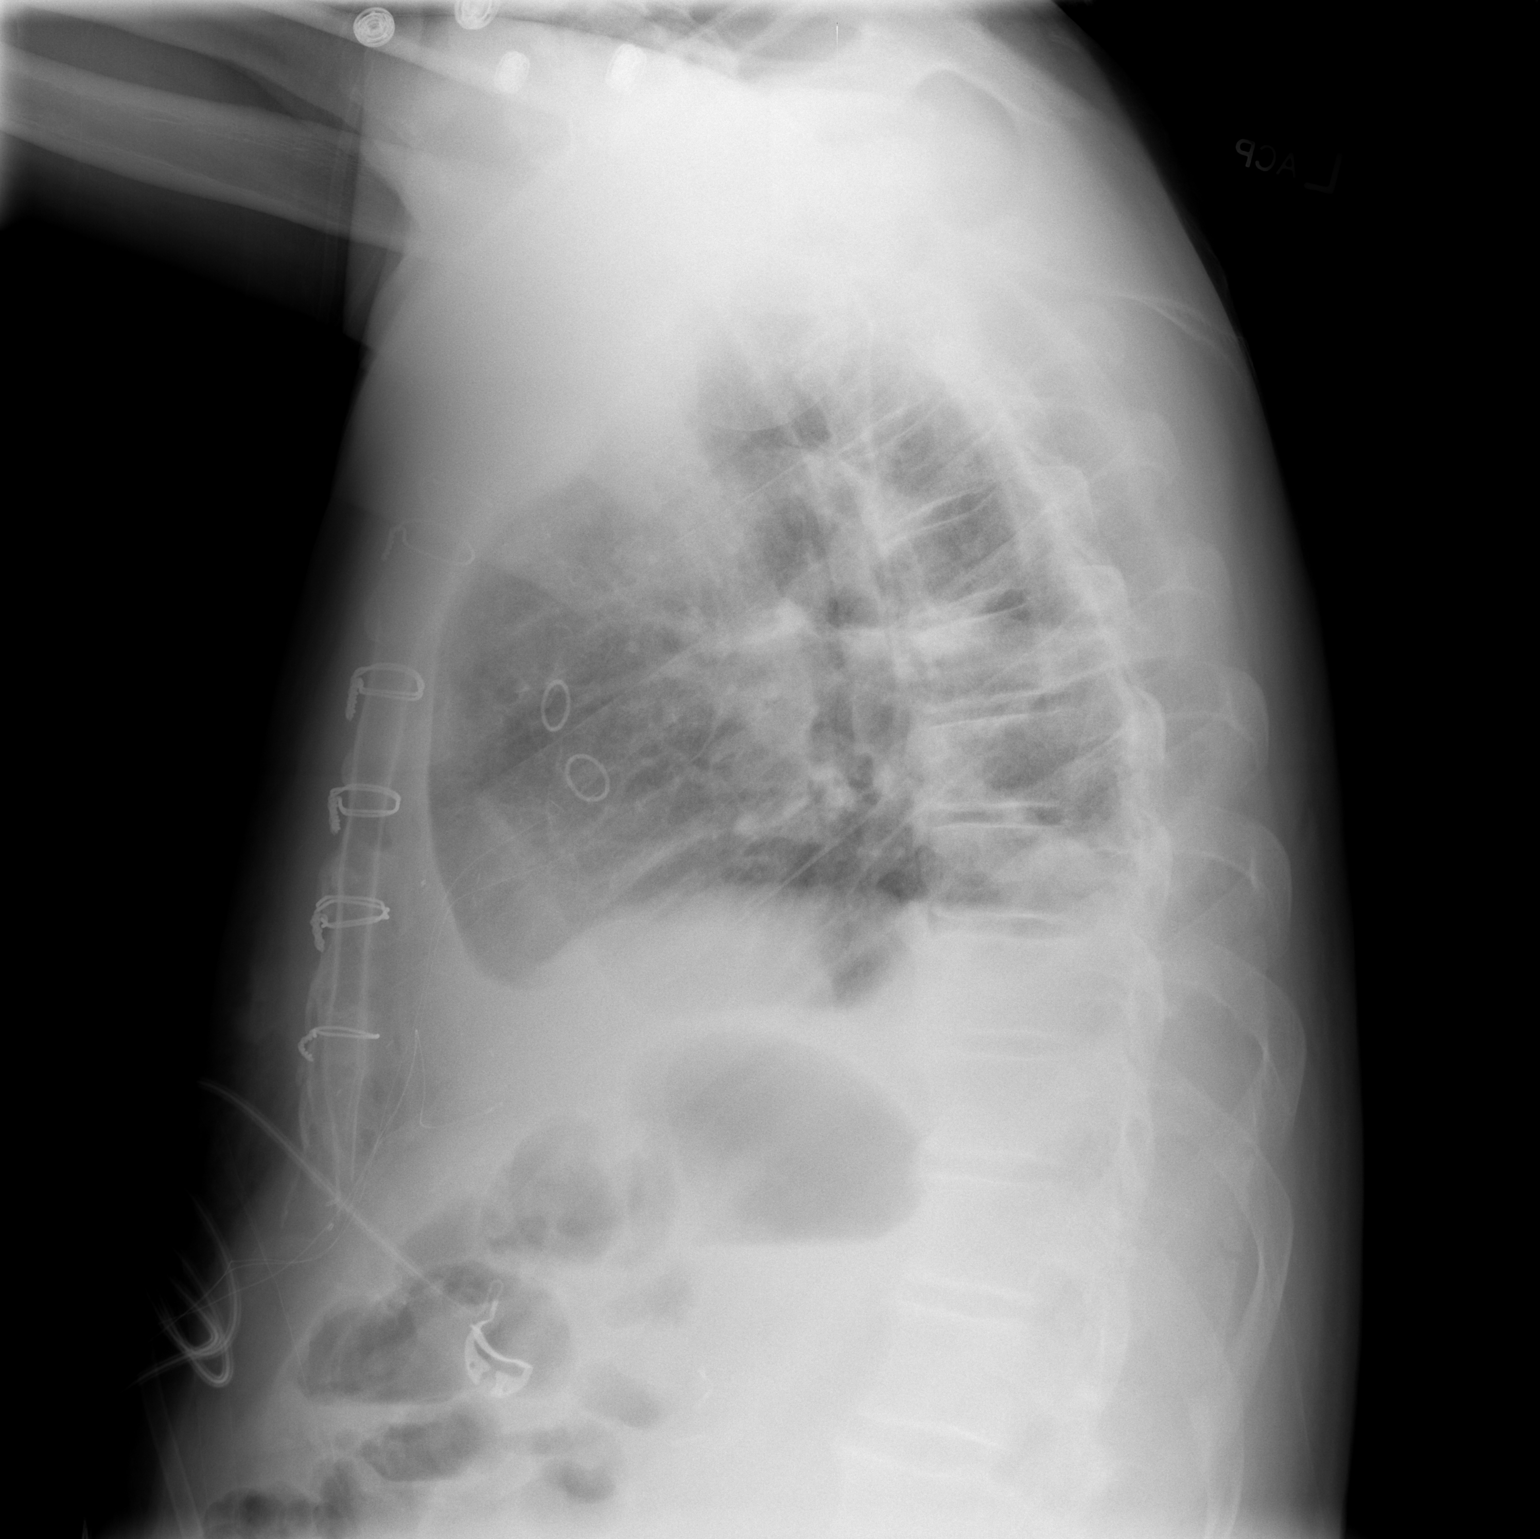

[2 of 2 positions shown; findings below may reference images not displayed]

FINDINGS: Patient has had a median sternotomy and CABG.  Heart is
enlarged.  There is streaky atelectatic changes in the right
midlung zone.  There are small bilateral pleural effusions.
Aeration on the left has improved.  No evidence for pulmonary
edema.
IMPRESSION: Slight improvement in aeration.  Persistent small effusions.

## 2009-05-15 IMAGING — CR DG CHEST 2V
2 series · 2 of 2 positions shown · non-contrast
Comparison: [DATE].

CLINICAL DATA: Short of breath.  Post CABG.

CHEST - 2 VIEW

[w chest pa]
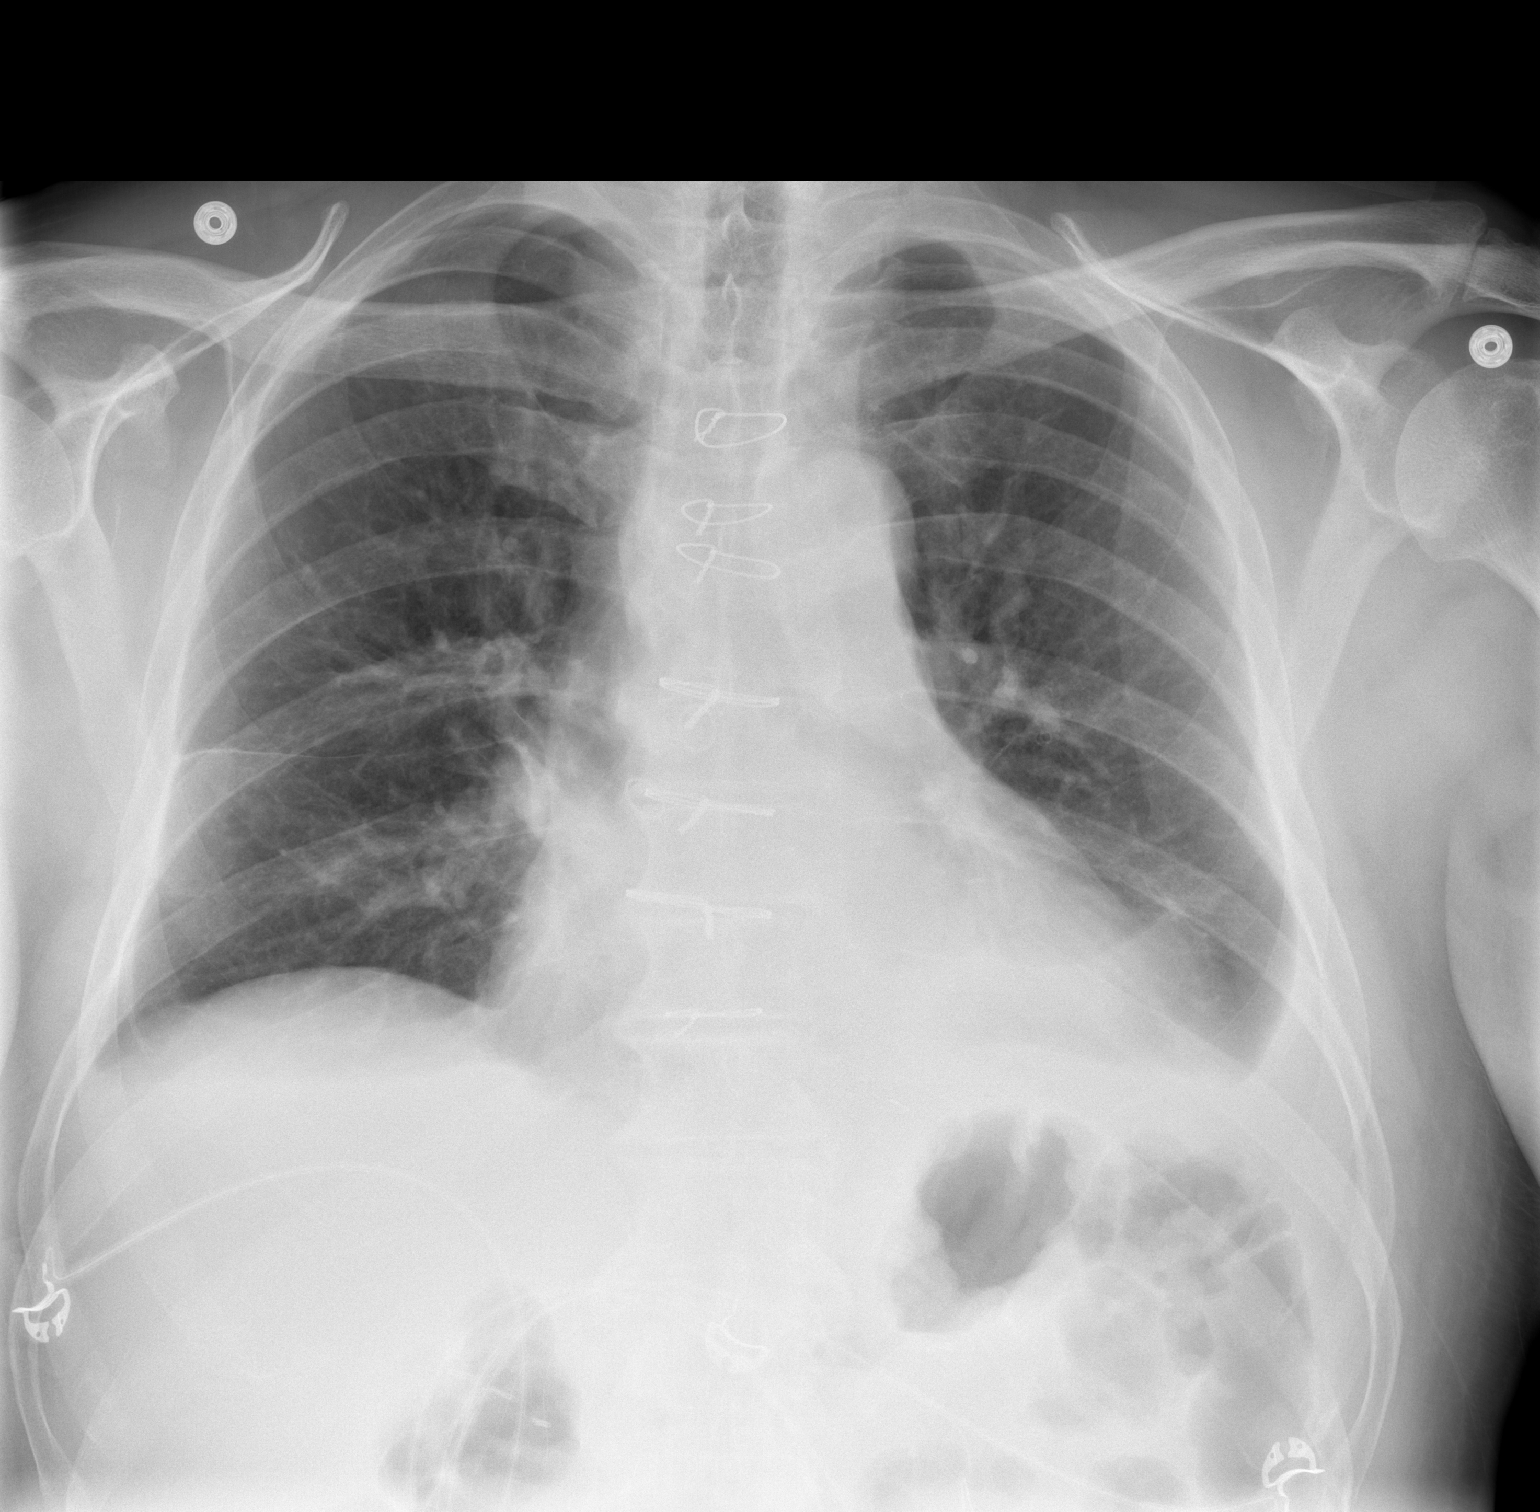

[w chest lat]
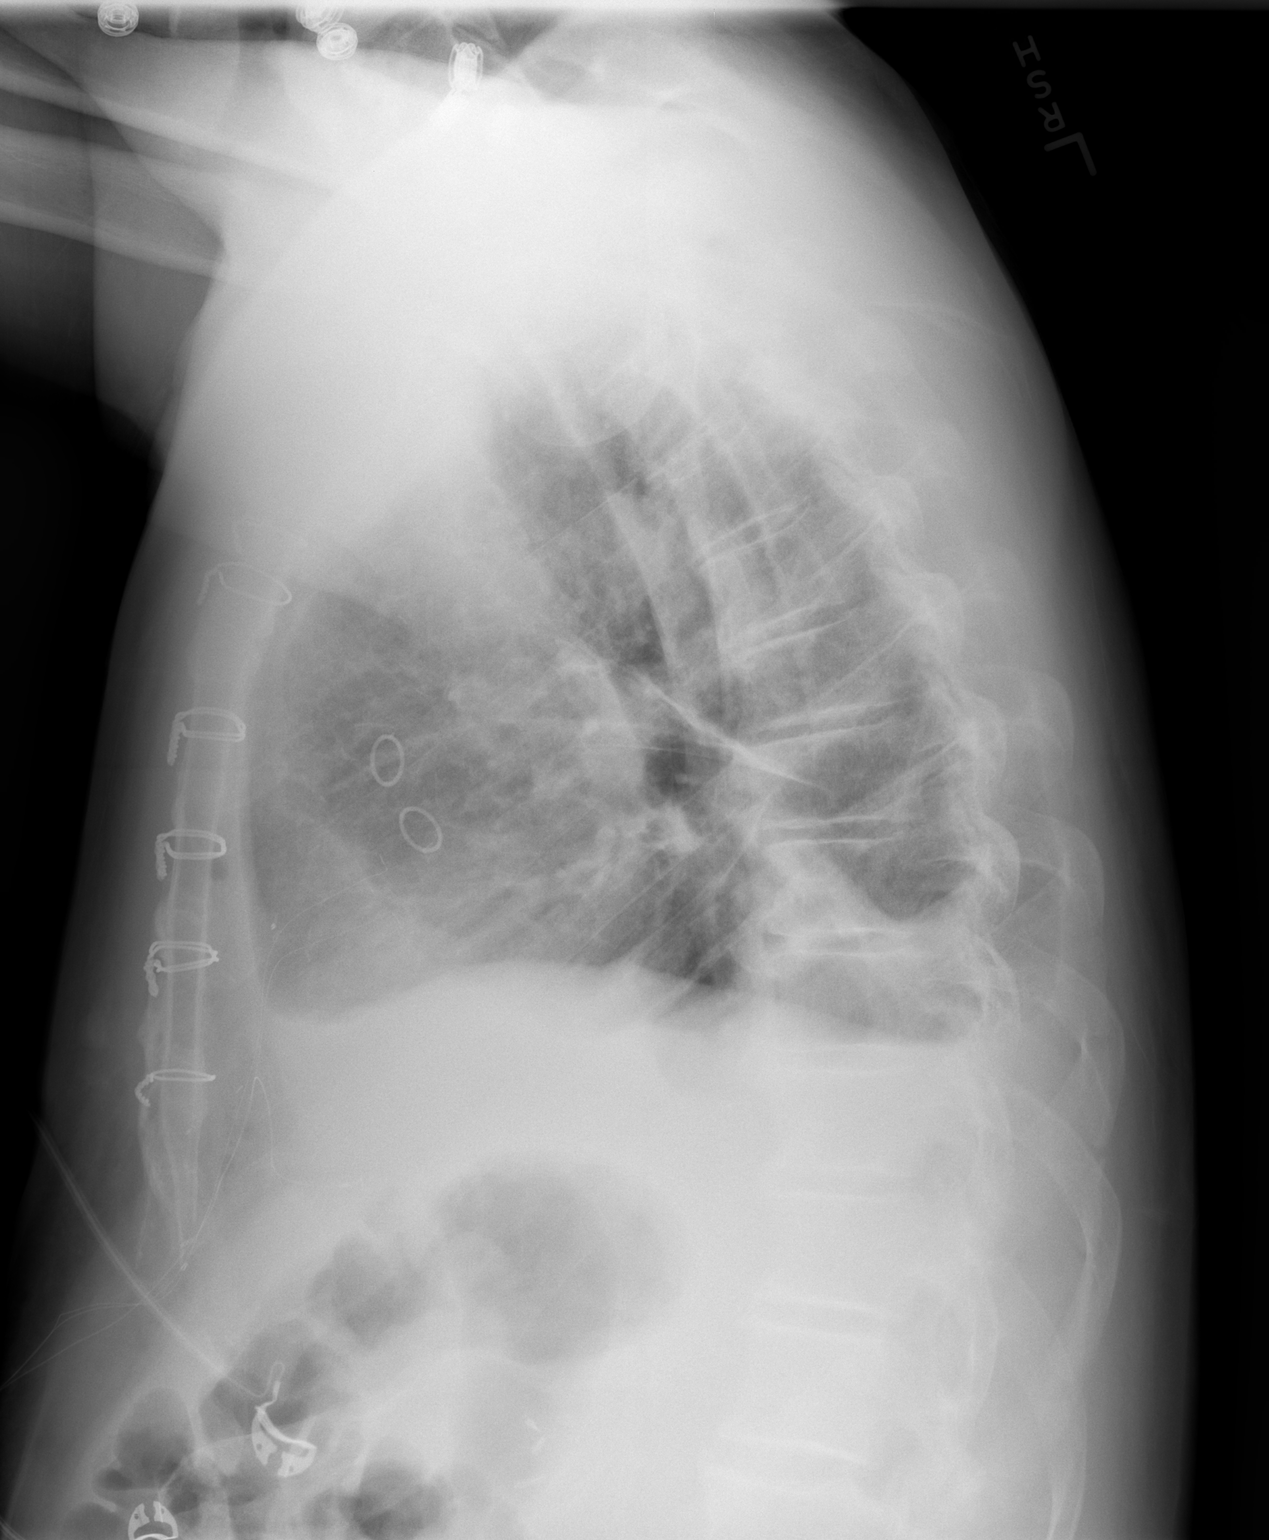

[2 of 2 positions shown; findings below may reference images not displayed]

FINDINGS: There is mild bibasilar atelectasis with interval
improvement of right lower lobe atelectasis.  Small bilateral
effusions are unchanged.  There is no edema or pneumothorax.
IMPRESSION: Bibasilar atelectasis and effusion, with slight interval
improvement in right lower lobe atelectasis.

## 2009-06-07 ENCOUNTER — Encounter: Admission: RE | Admit: 2009-06-07 | Discharge: 2009-06-07 | Payer: Self-pay | Admitting: Cardiothoracic Surgery

## 2009-06-07 ENCOUNTER — Ambulatory Visit: Payer: Self-pay | Admitting: Cardiothoracic Surgery

## 2009-06-07 IMAGING — CR DG CHEST 2V
2 series · 2 of 2 positions shown · non-contrast
Comparison: [DATE].

CLINICAL DATA: Shortness of breath.  Status post CABG on
[DATE].

CHEST - 2 VIEW

[w chest pa]
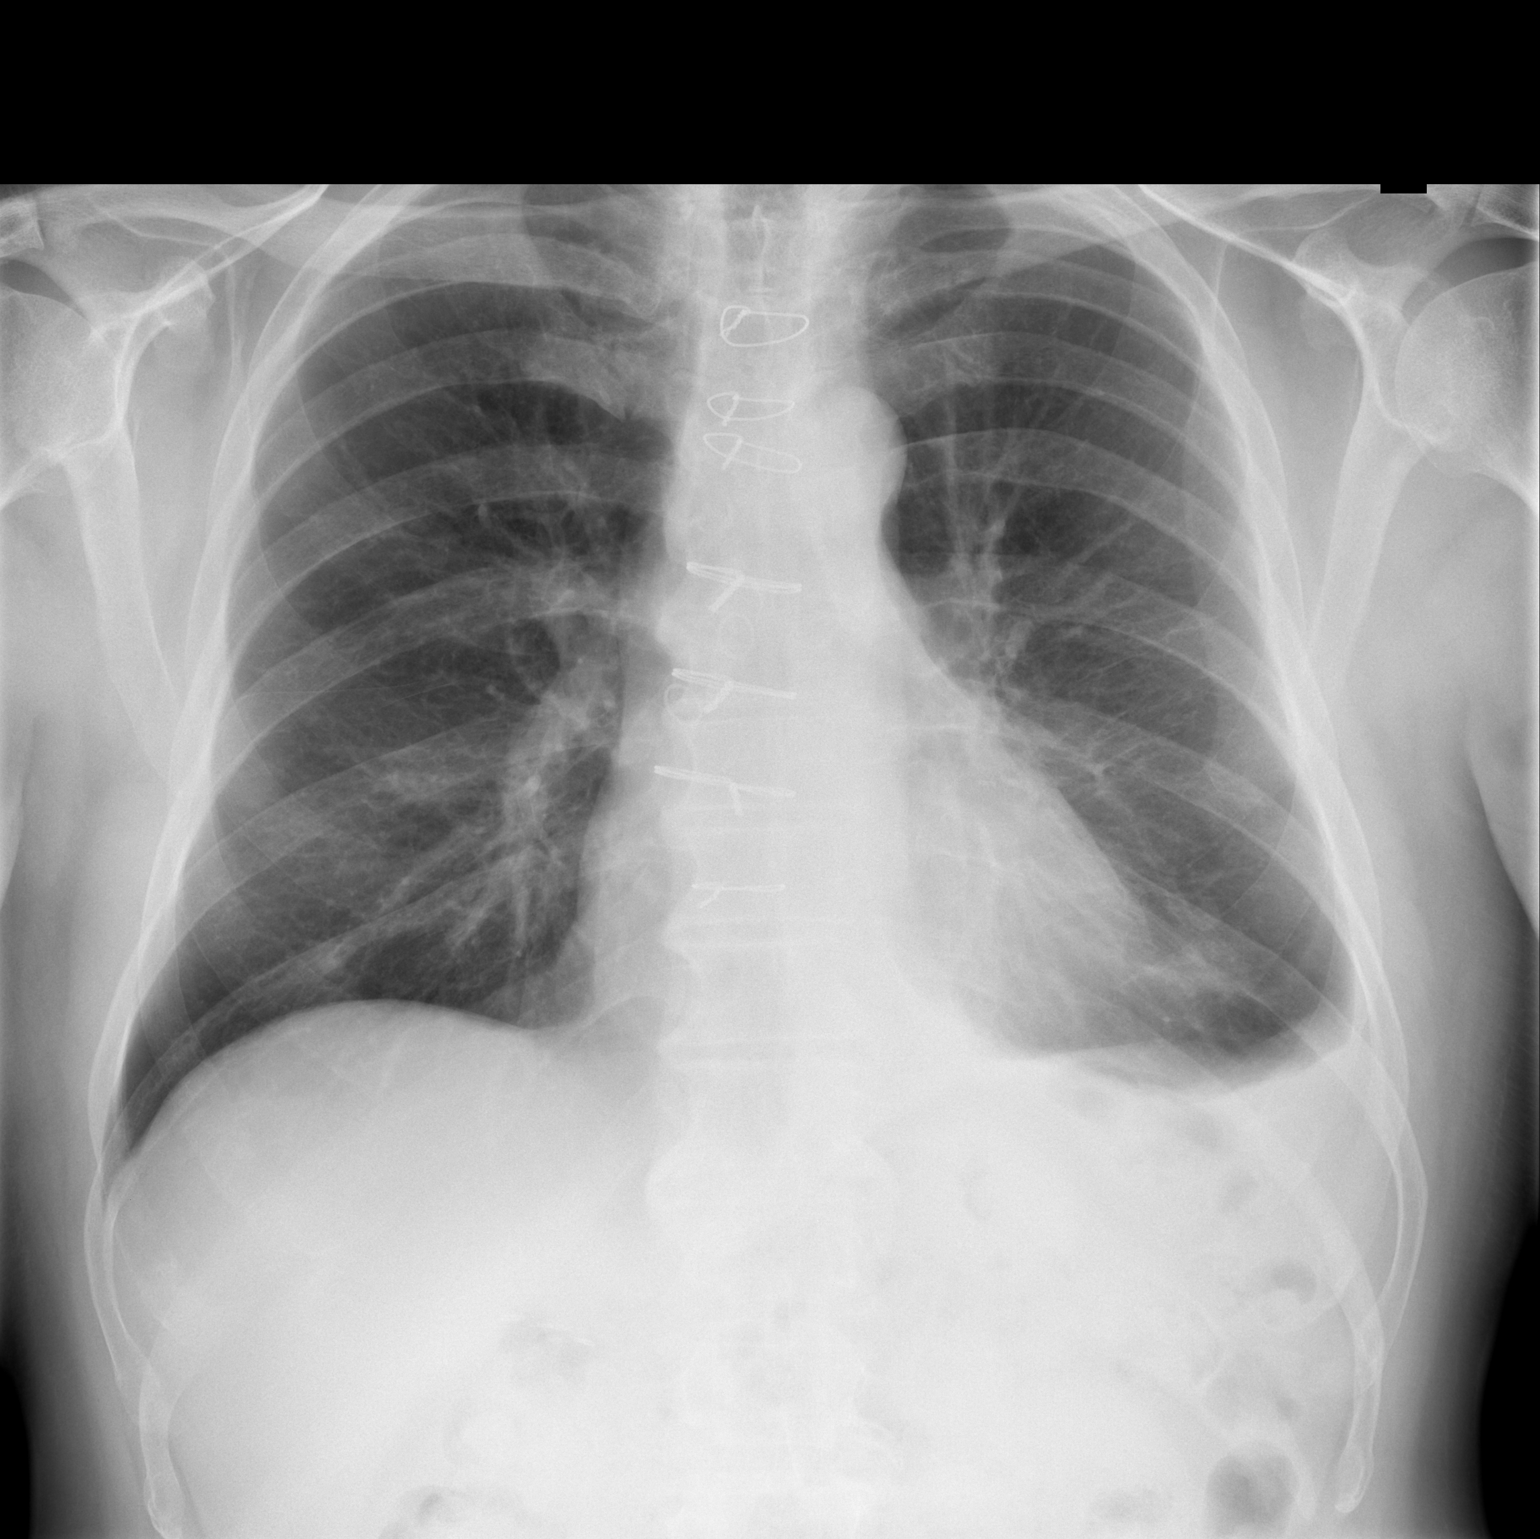

[w chest lat]
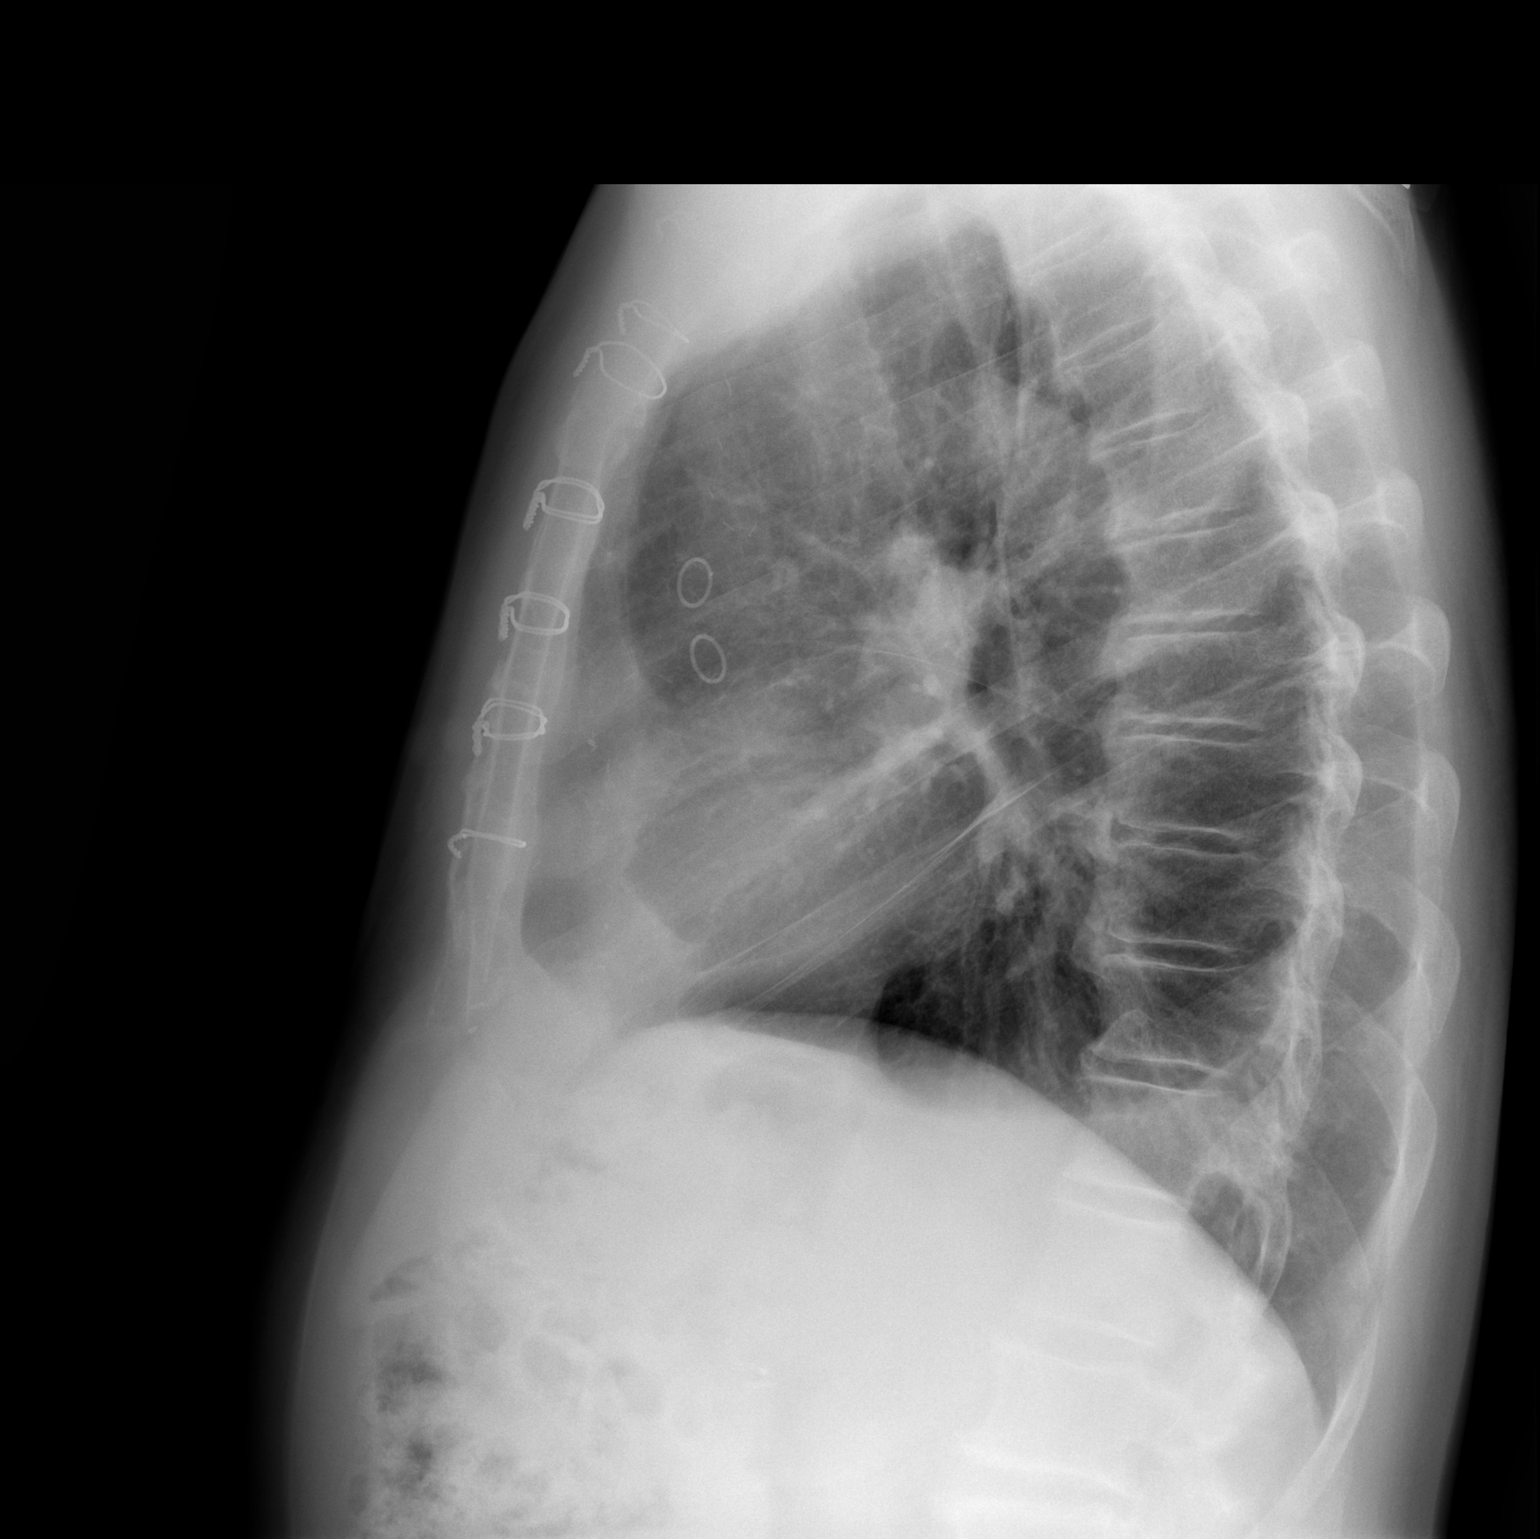

[2 of 2 positions shown; findings below may reference images not displayed]

FINDINGS: Improved inspiration with resolved bibasilar atelectasis.
Normal sized heart.  Post CABG changes.  Small left pleural
effusion with improvement.  Resolved right pleural fluid.
IMPRESSION: 1.  Resolved bibasilar atelectasis and right pleural fluid.
2.  Small left pleural effusion with improvement.

## 2009-08-01 ENCOUNTER — Encounter: Payer: Self-pay | Admitting: Cardiovascular Disease

## 2009-08-01 LAB — CONVERTED CEMR LAB
Cholesterol: 139 mg/dL
HDL: 41 mg/dL
LDL Cholesterol: 79 mg/dL
Triglyceride fasting, serum: 95 mg/dL

## 2009-10-17 ENCOUNTER — Ambulatory Visit: Payer: Self-pay | Admitting: Cardiovascular Disease

## 2009-10-17 HISTORY — PX: LEFT HEART CATH AND CORS/GRAFTS ANGIOGRAPHY: CATH118250

## 2009-11-28 ENCOUNTER — Ambulatory Visit: Payer: Self-pay | Admitting: Cardiovascular Disease

## 2009-11-28 DIAGNOSIS — I10 Essential (primary) hypertension: Secondary | ICD-10-CM | POA: Insufficient documentation

## 2009-11-28 DIAGNOSIS — E785 Hyperlipidemia, unspecified: Secondary | ICD-10-CM | POA: Insufficient documentation

## 2009-11-30 ENCOUNTER — Telehealth: Payer: Self-pay | Admitting: Cardiovascular Disease

## 2009-11-30 LAB — CONVERTED CEMR LAB
Albumin: 4.7 g/dL (ref 3.5–5.2)
HDL: 42 mg/dL (ref >39)
Total CHOL/HDL Ratio: 3.9
Triglycerides: 104 mg/dL (ref <150)

## 2009-12-06 ENCOUNTER — Encounter: Payer: Self-pay | Admitting: Cardiovascular Disease

## 2010-02-12 ENCOUNTER — Telehealth: Payer: Self-pay | Admitting: Cardiovascular Disease

## 2010-03-13 ENCOUNTER — Ambulatory Visit: Payer: Self-pay | Admitting: Cardiovascular Disease

## 2010-05-08 ENCOUNTER — Telehealth: Payer: Self-pay | Admitting: Cardiovascular Disease

## 2010-06-19 ENCOUNTER — Ambulatory Visit: Payer: Self-pay | Admitting: Cardiovascular Disease

## 2010-06-20 ENCOUNTER — Encounter: Payer: Self-pay | Admitting: Cardiovascular Disease

## 2010-06-27 LAB — CONVERTED CEMR LAB
Albumin: 4.8 g/dL (ref 3.5–5.2)
Cholesterol: 162 mg/dL (ref 0–200)
Indirect Bilirubin: 0.7 mg/dL (ref 0.0–0.9)
Testosterone-% Free: 1.7 % (ref 1.6–2.9)
Testosterone: 323.44 ng/dL (ref 250–890)
Total Bilirubin: 0.9 mg/dL (ref 0.3–1.2)
Total CHOL/HDL Ratio: 4.1
Total Protein: 7 g/dL (ref 6.0–8.3)
Triglycerides: 119 mg/dL (ref <150)

## 2010-07-19 ENCOUNTER — Ambulatory Visit: Payer: Self-pay | Admitting: General Practice

## 2010-07-19 IMAGING — CR DG CHEST 2V
1 series · 2 of 2 positions shown · non-contrast
Comparison: none

REASON FOR EXAM: cough
COMMENTS:

[Series 1: view not recorded · 0.17mm/px · 2 of 2 slices shown]
[im 1/2]
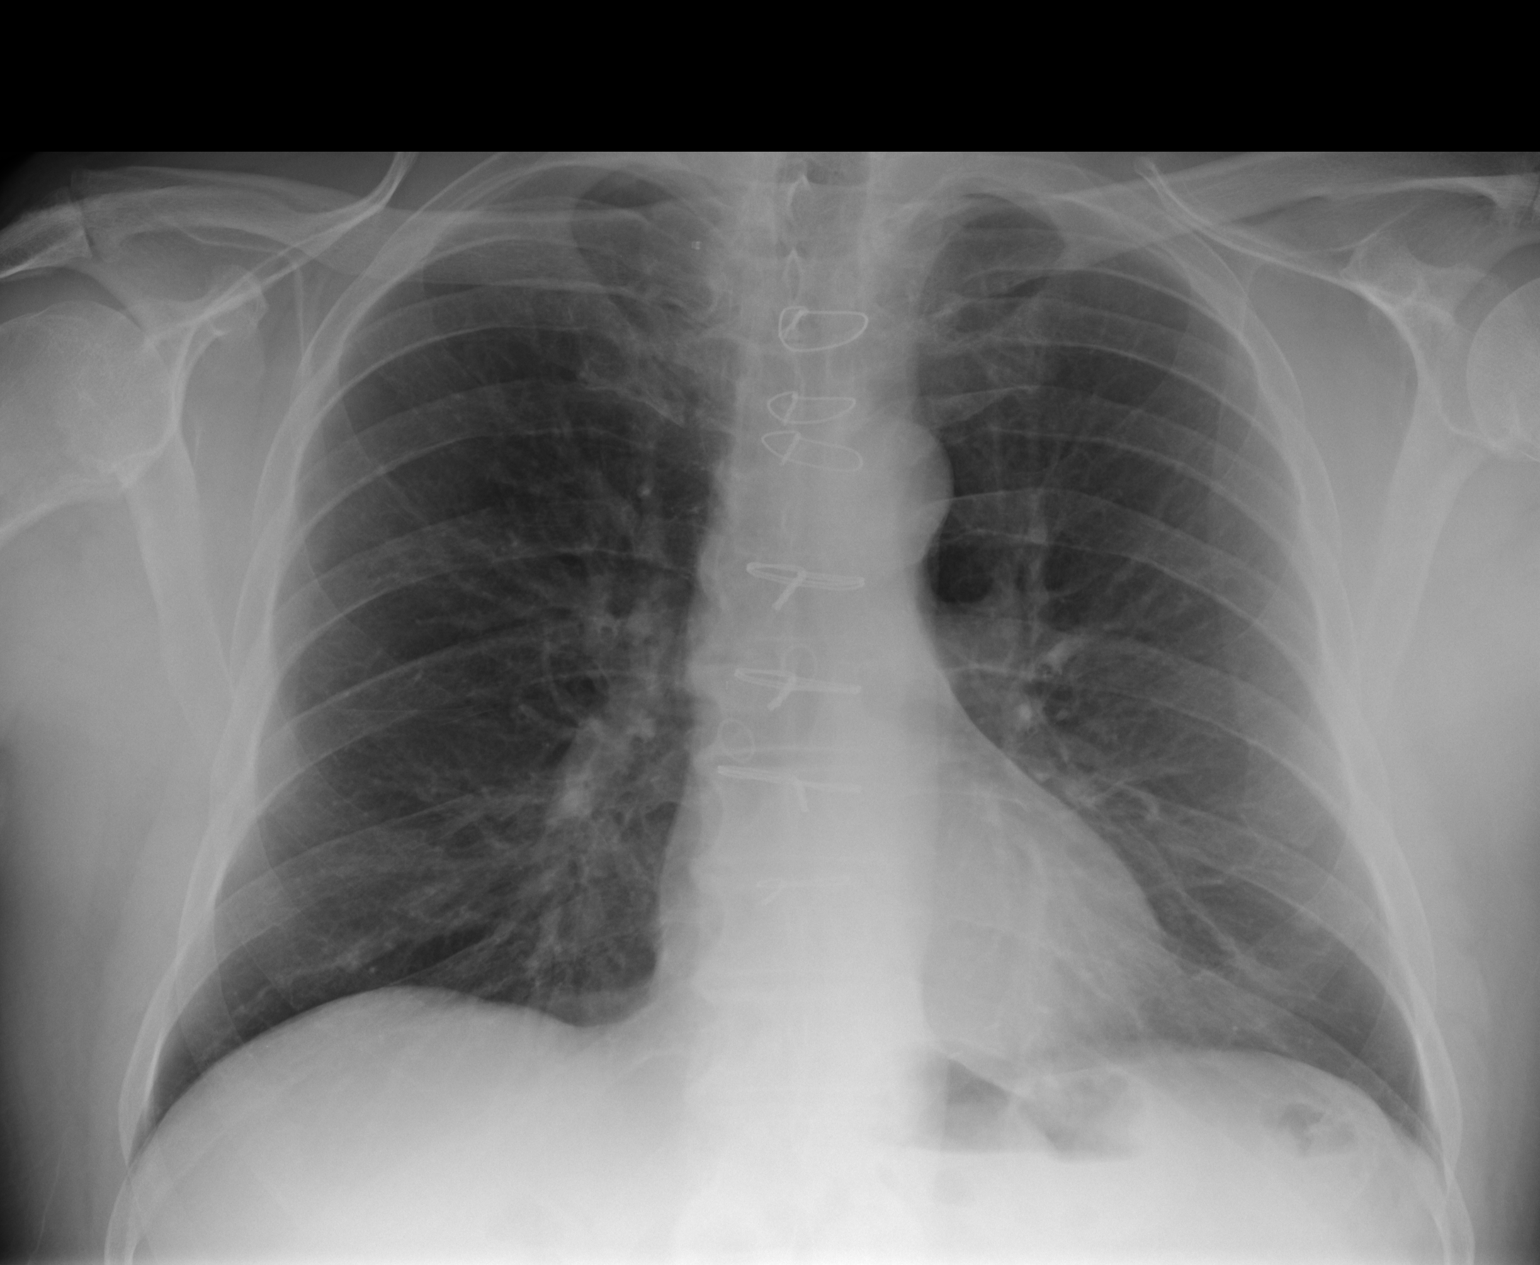
[im 2/2]
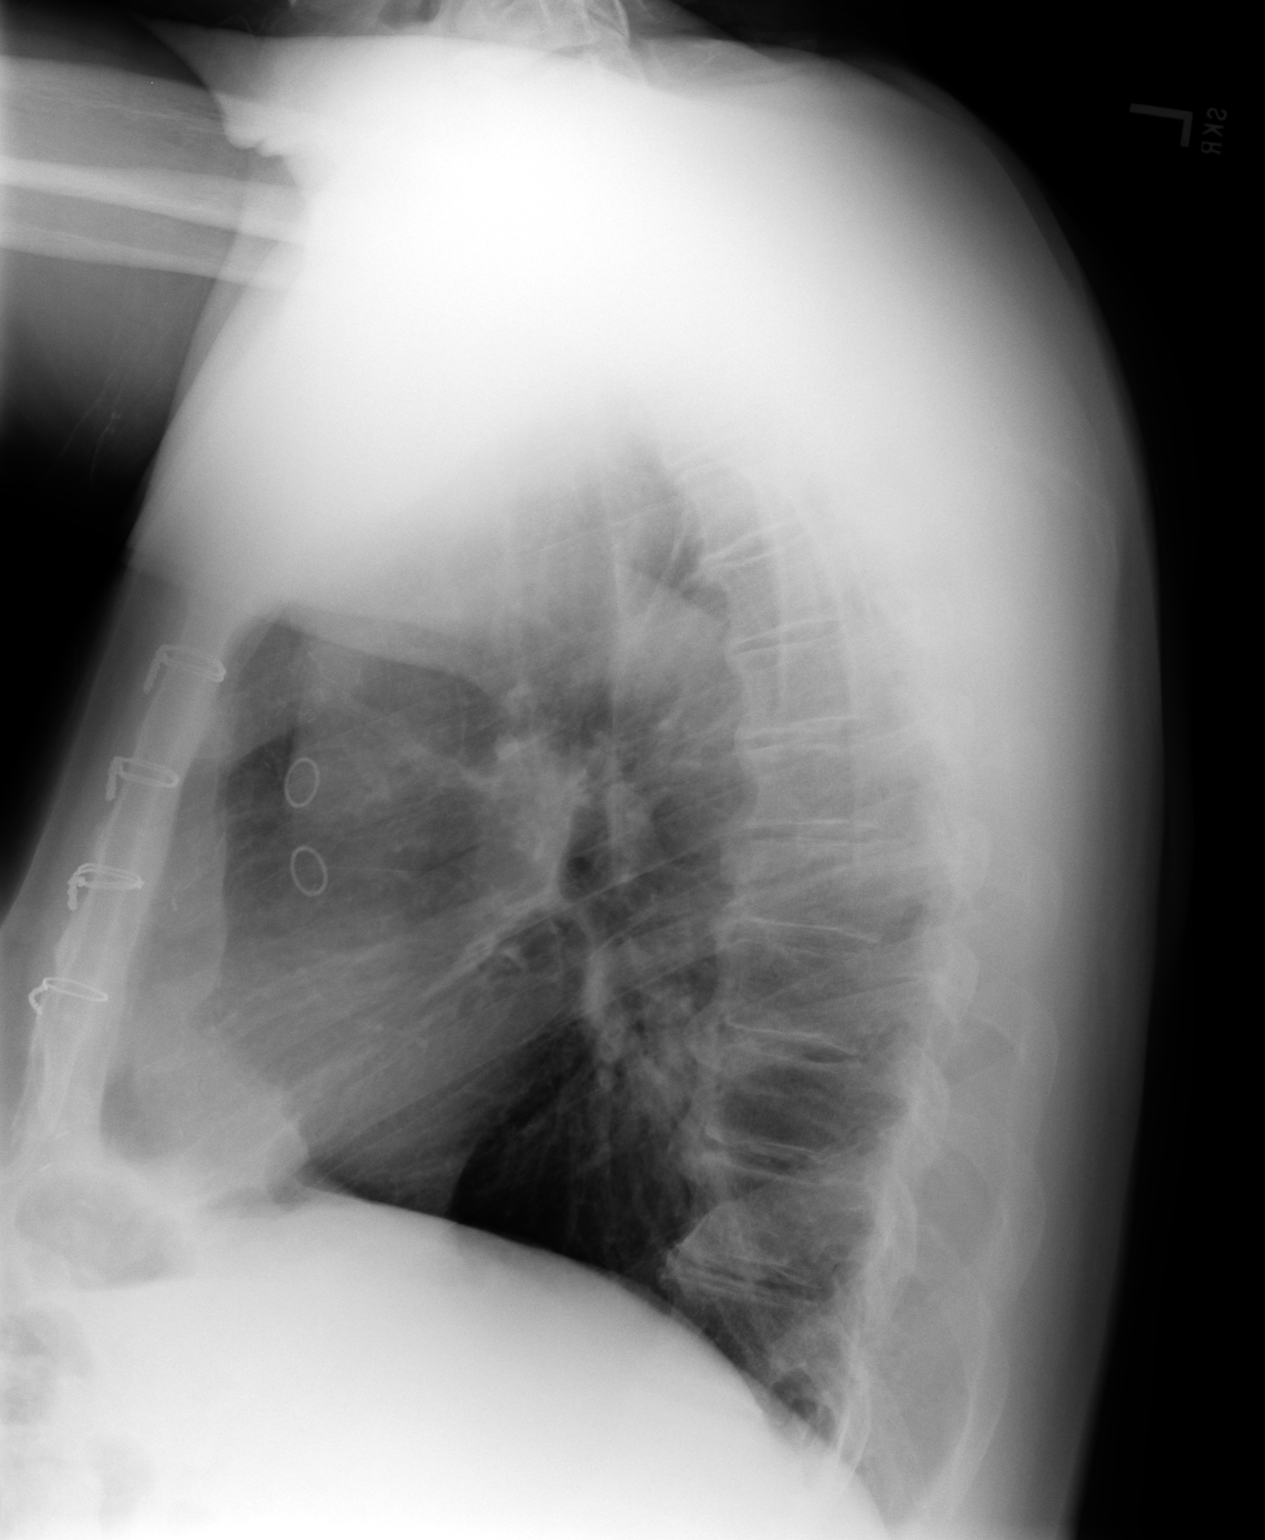

[2 of 2 positions shown; findings below may reference images not displayed]

PROCEDURE:     KDR - KDXR CHEST PA (OR AP) AND LAT  - [DATE]  [DATE]

RESULT:     Comparison is made to the prior exam [DATE]. The lung fields
are clear. No pneumonia, pneumothorax or pleural effusion is seen. The heart
size is normal. Postoperative changes of prior CABG are noted. No acute bony
abnormalities are seen.
IMPRESSION: 1. No acute changes are identified.
2. Postoperative changes consistent with interval CABG since the prior exam
are noted.

## 2010-08-20 NOTE — Assessment & Plan Note (Signed)
Summary: F/U 51MONTH/NE   Visit Type:  Follow-up Referring Tasfia Vasseur:  Kirke Corin Primary Mirren Gest:  n/a  CC:  c/o "having a cold and feeling tired." denies chest pain, SOB, and or palpitations..  History of Present Illness: [name] is a 65 year old gentleman with a history of smoking, coronary artery disease, occluded LAD in the mid vessel, 60% left main, 99% distal RCA who was sent to [facility] in October 2004 bypass surgery. He received 3 vessel bypass by Dr.Gerhardt with a LIMA to the LAD, vein graft to the OM1 and vein graft to the PDA, with subsequent cardiac catheter March 2011 for chest discomfort showing occluded vein grafts x2 with patent LIMA. He has collateral vessels from left to right. Ejection fraction 55% mild anterolateral hypokinesis.  he presents for routine followup. He feels well, is walking significant distances. 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 No significant chest pain. He does have occasional shortness of breath with heavy exertion. Otherwise he feels well. he does get bilateral knee pain. 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  No recent cholesterol. Last in May showing elevated lipids above goal. I believe this is on Crestor 10 mg daily       Current Medications (verified): 1)  Crestor 20 Mg Tabs (Rosuvastatin Calcium) .Marland Kitchen.. 1 Tablet By Mouth Daily. 2)  Metoprolol Tartrate 50 Mg Tabs (Metoprolol Tartrate) .... Take 1/2  Tablet By Mouth Twice A Day 3)  Aspirin Ec 325 Mg Tbec (Aspirin) .... Take One Tablet By Mouth Daily 4)  Lisinopril 10 Mg Tabs (Lisinopril) .... Take 1/2 Tablet By Mouth Daily 5)  Isosorbide Mononitrate Cr 30 Mg Xr24h-Tab (Isosorbide Mononitrate) .... Take One Tablet By Mouth Daily 6)  Amlodipine Besylate 10 Mg Tabs (Amlodipine Besylate) .... Take 1/2  Tablet By Mouth Daily  Allergies (verified): No Known Drug  Allergies  Review of Systems  The patient denies fever, weight loss, weight gain, vision loss, decreased hearing, hoarseness, chest pain, syncope, dyspnea on exertion, peripheral edema, prolonged cough, abdominal pain, incontinence, muscle weakness, depression, and enlarged lymph nodes.         Knee pain  Vital Signs:  Patient profile:   65 year old male Height:      69 inches Weight:      227.75 pounds BMI:     33.75 Pulse rate:   62 / minute BP sitting:   124 / 68  (left arm) Cuff size:   regular  Vitals Entered By: Lysbeth Galas CMA (June 19, 2010 11:01 AM)  Physical Exam  General:  well-appearing middle-aged gentleman in no apparent distress, HEENT exam is benign, oropharynx is clear, neck is supple with no JVP or carotid bruits, heart sounds are regular with S1-S2 and no murmurs appreciated, lungs are clear to auscultation with no wheezes Rales, abdominal exam is benign, no significant lower extremity edema, neurologic exam is nonfocal, skin is warm and dry, pulses are equal and symmetrical in his upper and lower extremities.   Impression & Recommendations:  Problem # 1:  CAD, ARTERY BYPASS GRAFT (ICD-414.04) no symptoms of angina at this time. We will lift his weight restrictions at work to > 30 pounds.  His updated medication list for this problem includes:    Metoprolol Tartrate 50 Mg Tabs (Metoprolol tartrate) .Marland Kitchen... Take 1/2  tablet by mouth twice a day    Aspirin Ec 325 Mg  Tbec (Aspirin) .Marland Kitchen... Take one tablet by mouth daily    Lisinopril 10 Mg Tabs (Lisinopril) .Marland Kitchen... Take 1/2 tablet by mouth daily    Isosorbide Mononitrate Cr 30 Mg Xr24h-tab (Isosorbide mononitrate) .Marland Kitchen... Take one tablet by mouth daily    Amlodipine Besylate 10 Mg Tabs (Amlodipine besylate) .Marland Kitchen... Take 1/2  tablet by mouth daily  Problem # 2:  TOBACCO ABUSE, HX OF (ICD-V15.82) No longer smokes. He quit using a electronic cigarette  Problem # 3:  HYPERTENSION, BENIGN (ICD-401.1) blood pressure is  well controlled on his current medication regimen.  His updated medication list for this problem includes:    Metoprolol Tartrate 50 Mg Tabs (Metoprolol tartrate) .Marland Kitchen... Take 1/2  tablet by mouth twice a day    Aspirin Ec 325 Mg Tbec (Aspirin) .Marland Kitchen... Take one tablet by mouth daily    Lisinopril 10 Mg Tabs (Lisinopril) .Marland Kitchen... Take 1/2 tablet by mouth daily    Amlodipine Besylate 10 Mg Tabs (Amlodipine besylate) .Marland Kitchen... Take 1/2  tablet by mouth daily  Problem # 4:  HYPERCHOLESTEROLEMIA (ICD-272.0) We will check his cholesterol today. If he continues to be a buckle, we will add zetia 10 mg daily  His updated medication list for this problem includes:    Crestor 20 Mg Tabs (Rosuvastatin calcium) .Marland Kitchen... 1 tablet by mouth daily.  Orders: T-Hepatic Function [id number]) T-Lipid Profile [id number])  Other Orders: Candelaria Celeste [id number])  Patient Instructions: 1)  Your physician recommends that you schedule a follow-up appointment in: 6 months 2)  Your physician recommends that you continue on your current medications as directed. Please refer to the Current Medication list given to you today.  Appended Document: F/U 41MONTH/NE EKG today shows normal sinus rhythm with rate 60 beats per minute, no significant ST or T wave changes   Appended Document: F/U 41MONTH/NE symptoms of fatigue at home, erectile dysfunction. We will check a testosterone level

## 2010-08-20 NOTE — Assessment & Plan Note (Signed)
Summary: NURSE VISIT/AMD  Nurse Visit   Vital Signs:  Patient profile:   65 year old male Height:      69 inches Weight:      226 pounds BMI:     33.50 Pulse rate:   48 / minute Pulse (ortho):   98 / minute BP sitting:   148 / 76  (left arm) Cuff size:   large  Vitals Entered By: Bishop Dublin, CMA (March 13, 2010 9:07 AM)  Visit Type:  Follow-up  CC:  c/o shortness of breath with decreased heart rate this a.m. of 51 checked by employee nurse.  Denies chest pain..   Patient Instructions: 1)  Your physician has recommended you make the following change in your medication: DECREASE metoprolol 1/2 tabs two times a day  2)  Your physician has requested that you regularly monitor and record your blood pressure readings at home.  Please take your blood pressure several times a day and call Morrie Sheldon with your readings.      Current Medications (verified): 1)  Crestor 20 Mg Tabs (Rosuvastatin Calcium) .... 1/2  Tablet By Mouth Daily. 2)  Metoprolol Tartrate 50 Mg Tabs (Metoprolol Tartrate) .... Take One Tablet By Mouth Twice A Day 3)  Aspirin Ec 325 Mg Tbec (Aspirin) .... Take One Tablet By Mouth Daily 4)  Lisinopril 10 Mg Tabs (Lisinopril) .... Take One Tablet By Mouth Daily 5)  Isosorbide Mononitrate Cr 30 Mg Xr24h-Tab (Isosorbide Mononitrate) .... Take One Tablet By Mouth Daily 6)  Amlodipine Besylate 10 Mg Tabs (Amlodipine Besylate) .... Take One Tablet By Mouth Daily  Allergies (verified): No Known Drug Allergies  Past History:  Past Medical History: Last updated: 12/06/2009 CAD Atrial Fibrillation Hyperlipidemia  Past Surgical History: Last updated: 12/19/09 CABG  Family History: Last updated: 19-Dec-2009 Father: DECEASED 61: ? Mother: DECEASED 8: MI  Social History: Last updated: 12/19/09 Full Time Married  Tobacco Use - Former.  Alcohol Use - no Regular Exercise - no Drug Use - no  Risk Factors: Alcohol Use: 0 (12-19-09) Caffeine Use: no   (12-19-2009) Exercise: no (19-Dec-2009)  Risk Factors: Smoking Status: quit (2009-12-19)   Orders Added: 1)  EKG w/ Interpretation [93000]

## 2010-08-20 NOTE — Assessment & Plan Note (Signed)
Summary: NEW PT   Visit Type:  np6 Referring Provider:  arida Primary Provider:  n/a  CC:  shortness of breath, no cp, and little bit of swelling in ankles and feet.  History of Present Illness: [name] is a 65 year old gentleman with a history of coronary artery disease him a occluded LAD in the mid vessel, 60% left main, 99% distal RCA who was sent to [facility] in October 2004 bypass surgery. He received 3 vessel bypass by Dr.Gerhardt with a LIMA to the LAD, vein graft to the OM1 and vein graft to the PDA, with subsequent cardiac catheter March 2011 for chest discomfort showing occluded vein grafts x2 with patent LIMA. He has collateral vessels from left to right. Ejection fraction 55% mild anterolateral hypokinesis.  He presents today to establish care. He has not been seen by [facility] before.  He states that overall he is doing well although he does have shortness of breath when he lifts something heavy and exerts himself. He is able to walk long distances with no symptoms of significant shortness of breath. He states that his blood pressure at home has typically been in the 120s systolic. He did have atrial fibrillation in his perioperative period and was on amiodarone and this has been discontinued. He did have muscle aches on Crestor 20 mg daily and this was decreased to 10 mg. He denies any lightheadedness dizziness on his current medication regiment.  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 There is no heavy lifting at work though there is significant walking. He feels that he is able to do his job without any limitations at this time. Most of the heavy lifting is done at home. He denies any significant lower extremity edema.  Lipid panel was not available to [place] at this time and has not been checked since his Crestor was decreased to 10 mg daily  Preventive Screening-Counseling & Management  Alcohol-Tobacco     Alcohol drinks/day: 0   Smoking Status: quit     Year Quit: 2010  Caffeine-Diet-Exercise     Caffeine use/day: no      Does Patient Exercise: no      Drug Use:  no.    Current Medications (verified): 1)  Crestor 20 Mg Tabs (Rosuvastatin Calcium) .... 1/2  Tablet By Mouth Daily. 2)  Metoprolol Tartrate 50 Mg Tabs (Metoprolol Tartrate) .... Take One Tablet By Mouth Twice A Day 3)  Aspirin Ec 325 Mg Tbec (Aspirin) .... Take One Tablet By Mouth Daily 4)  Lisinopril 10 Mg Tabs (Lisinopril) .... Take One Tablet By Mouth Daily 5)  Isosorbide Mononitrate Cr 30 Mg Xr24h-Tab (Isosorbide Mononitrate) .... Take One Tablet By Mouth Daily  Allergies (verified): No Known Drug Allergies  Past History:  Past Medical History: CAD  Past Surgical History: CABG  Family History: Father: DECEASED 77: ? Mother: DECEASED 73: MI  Social History: Full Time Married  Tobacco Use - Former.  Alcohol Use - no Regular Exercise - no Drug Use - no Alcohol drinks/day:  0 Smoking Status:  quit Caffeine use/day:  no  Does Patient Exercise:  no Drug Use:  no  Review of Systems       The patient complains of dyspnea on exertion.  The patient denies fever, weight loss, weight gain, vision loss, decreased hearing, hoarseness, chest pain, syncope, peripheral edema, prolonged cough, abdominal pain, incontinence, muscle weakness, depression, and enlarged lymph nodes.    Vital Signs:  Patient profile:   65 year old male Height:      69 inches Weight:      217.50 pounds BMI:     32.24 Pulse rate:   53 / minute Pulse rhythm:   regular BP sitting:   174 / 80  (left arm) Cuff size:   large  Vitals Entered By: Mercer Pod (Nov 28, 2009 11:55 AM)  Physical Exam  General:  well-appearing middle-aged gentleman in no apparent distress, HEENT exam is benign, oropharynx is clear, neck is supple with no JVP or carotid bruits, heart sounds are regular with S1-S2 and no murmurs appreciated, lungs are clear to auscultation with  no wheezes Rales, abdominal exam is benign, no significant lower extremity edema, neurologic exam is nonfocal, skin is warm and dry, pulses are equal and symmetrical in his upper and lower extremities.   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 We have encouraged him to start an exercise program to help build up his collateral vessels.  We will keep his Imdur at its current dose. He is on aspirin full dose.  His updated medication list for this problem includes:    Metoprolol Tartrate 50 Mg Tabs (Metoprolol tartrate) .Marland Kitchen... Take one tablet by mouth twice a day    Aspirin Ec 325 Mg Tbec (Aspirin) .Marland Kitchen... Take one tablet by mouth daily    Lisinopril 10 Mg Tabs (Lisinopril) .Marland Kitchen... Take one tablet by mouth daily    Isosorbide Mononitrate Cr 30 Mg Xr24h-tab (Isosorbide mononitrate) .Marland Kitchen... Take one tablet by mouth daily    Amlodipine Besylate 10 Mg Tabs (Amlodipine besylate) .Marland Kitchen... Take one tablet by mouth daily  Problem # 2:  HYPERTENSION, BENIGN (ICD-401.1) Blood pressure is elevated today which may be contributing to his shortness of breath with exertion. In the office is 170 systolic which is the same on recheck 20 minutes later. Have asked him to keep his blood pressures at home and had called him a prescription for amlodipine 10 mg daily progesterone 5 mg if his  pressures are only mildly elevated. He will have the nurse at work follow his blood pressures and call our office.  His updated medication list for this problem includes:    Metoprolol Tartrate 50 Mg Tabs (Metoprolol tartrate) .Marland Kitchen... Take one tablet by mouth twice a day    Aspirin Ec 325 Mg Tbec (Aspirin) .Marland Kitchen... Take one tablet by mouth daily    Lisinopril 10 Mg Tabs (Lisinopril) .Marland Kitchen... Take one tablet by mouth daily    Amlodipine Besylate 10 Mg Tabs (Amlodipine besylate) .Marland Kitchen... Take one tablet by mouth daily  Problem # 3:  HYPERCHOLESTEROLEMIA (ICD-272.0) He's been on Crestor 10 mg for the past 2 months and we will check a cholesterol and LFTs today.  His updated medication list for this problem includes:  Crestor 20 Mg Tabs (Rosuvastatin calcium) .Marland Kitchen... 1/2  tablet by mouth daily.  Orders: T-Hepatic Function [id number]) T-Lipid Profile [id number])  Problem # 4:  TOBACCO ABUSE, HX OF (ICD-V15.82) History of tobacco abuse for at least 40 years which likely contributed to his underlying coronary disease. No evidence of significant lower extremity claudication and no carotid bruits.  Patient Instructions: 1)  Your physician has recommended you make the following change in your medication: Start taking Amlodipine 10mg  daily. 2)  Your physician recommends that you schedule a follow-up appointment in: 6 months. Prescriptions: AMLODIPINE BESYLATE 10 MG TABS (AMLODIPINE BESYLATE) Take one tablet by mouth daily  #30 x 6   Entered by:   Cloyde Reams RN   Authorized by:   Dossie Arbour MD   Signed by:   Cloyde Reams RN on 11/28/2009   Method used:   Electronically to        [facility]  [address] (retail)       [address]       [place], [place]  [address]       Ph: [phone]       Fax: [phone]   RxID:   [id number]

## 2010-08-20 NOTE — Miscellaneous (Signed)
Summary: Medlist change  Clinical Lists Changes  Medications: Changed medication from CRESTOR 20 MG TABS (ROSUVASTATIN CALCIUM) 1/2  tablet by mouth daily. to CRESTOR 20 MG TABS (ROSUVASTATIN CALCIUM) 1 tablet by mouth daily. Changed medication from AMLODIPINE BESYLATE 10 MG TABS (AMLODIPINE BESYLATE) Take one tablet by mouth daily to AMLODIPINE BESYLATE 10 MG TABS (AMLODIPINE BESYLATE) Take 1/2  tablet by mouth daily

## 2010-08-20 NOTE — Letter (Signed)
Summary: PHI  PHI   Imported By: Harlon Flor 12/03/2009 08:05:26  _____________________________________________________________________  External Attachment:    Type:   Image     Comment:   External Document

## 2010-08-20 NOTE — Progress Notes (Signed)
Summary: CONGESTION  Phone Note Call from Patient Call back at Home Phone [phone] Call back at [phone]   Caller: SELF Call For: [facility] Summary of Call: PT WOULD LIKE TO KNOW WHAT HE CAN TAKE FOR CONGESTION Initial call taken by: Harlon Flor,  May 08, 2010 9:21 AM  Follow-up for Phone Call        Per Dr. Gala Romney ok for pt to take clartin or zyrtec. Also recomended that the pt use humidfier.  Follow-up by: Benedict Needy, RN,  May 08, 2010 3:40 PM

## 2010-08-20 NOTE — Progress Notes (Signed)
Summary: REFILL crestor, lisinopril, and metroprolol  Phone Note Refill Request Call back at Home Phone [phone] Message from:  Patient on February 12, 2010 12:28 PM  Refills Requested: Medication #1:  METOPROLOL TARTRATE 50 MG TABS Take one tablet by mouth twice a day  Medication #2:  CRESTOR 20 MG TABS 1/2  tablet by mouth daily.  Medication #3:  LISINOPRIL 10 MG TABS Take one tablet by mouth daily [facility]  (PATIENT ALSO SAID HE IS ON NITRO AND IS COMPLETELY OUT AND NEEDS THAT CALLED IN TOO)  Initial call taken by: West Carbo,  February 12, 2010 12:28 PM Caller: Patient Call For: GOLLAN    Prescriptions: LISINOPRIL 10 MG TABS (LISINOPRIL) Take one tablet by mouth daily  #30 x 6   Entered by:   Bishop Dublin, CMA   Authorized by:   Dossie Arbour MD   Signed by:   Bishop Dublin, CMA on 02/12/2010   Method used:   Electronically to        [facility] (retail)       [address]       [place], [place]  [address]       Ph: [phone]       Fax: [phone]   RxID:   [id number] METOPROLOL TARTRATE 50 MG TABS (METOPROLOL TARTRATE) Take one tablet by mouth twice a day  #60 x 6   Entered by:   Bishop Dublin, CMA   Authorized by:   Dossie Arbour MD   Signed by:   Bishop Dublin, CMA on 02/12/2010   Method used:   Electronically to        [facility] (retail)       [address]       [place], [place]  [address]       Ph: [phone]       Fax: [phone]   RxID:   [id number] CRESTOR 20 MG TABS (ROSUVASTATIN CALCIUM) 1/2  tablet by mouth daily.  #30 x 6   Entered by:   Bishop Dublin, CMA   Authorized by:   Dossie Arbour MD   Signed by:   Bishop Dublin, CMA on 02/12/2010   Method used:   Electronically to        [facility] (retail)       [address]       [place], [place]  [address]       Ph: [phone]       Fax: [phone]   RxID:   [id number]   Appended  Document: REFILL crestor, lisinopril, and metroprolol Spoke with pharmacist and refill for Crestor 20 mg has been increased from 1/2 tablet everyday to one tablet everyday gave 30 tablets.

## 2010-08-20 NOTE — Letter (Signed)
Summary: Medical Record Release  Medical Record Release   Imported By: Harlon Flor 04/08/2010 13:25:42  _____________________________________________________________________  External Attachment:    Type:   Image     Comment:   External Document

## 2010-08-20 NOTE — Progress Notes (Signed)
Summary: CALL BACK  Phone Note Call from Patient Call back at Home Phone [phone]   Caller: WIFE Call For: [facility] Summary of Call: WOULD LIKE A CALL BACK ABOUT HIS BP READINGS Initial call taken by: Harlon Flor,  Nov 30, 2009 2:16 PM  Follow-up for Phone Call        Called spoke with pt's wife had BP monitored at work yesterday 148/79 6:30am rose as the day progressed at 4pm 180/80 at 5:15pm once home 162/81.  Took 1/2 10mg  Amlodipine BP at 10:15pm 151/77 at 11:30pm 149/75.  This am took another 1/2 tablet of Amlodipine and BP has been good throughout the day.  Pt is going to try to start taking 1 tablet each day and will continue to monitor and advise of any problems.   Follow-up by: Cloyde Reams RN,  Nov 30, 2009 4:19 PM    Additional Follow-up for Phone Call Additional follow up Details #2::    ok. full tablet, 10 mg daily

## 2010-08-20 NOTE — Letter (Signed)
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   [place], [place] [address]   Phone: [phone]  Fax: [phone]    June 19, 2010  Re:     [facility] Address:   [address] [place] [address]     [place], [place]  [address] DOB:     02/24/1946 MRN:     [id number]   To Whom it May Concern,  [name] was seen in our office today by Dr Julien Nordmann and his is no longer restricted to a 30 lb weight restriction.     Sincerely,  Dr Julien Nordmann

## 2010-08-20 NOTE — Letter (Signed)
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 [address]   [place], [place] [address]   Phone: [phone]  Fax: [phone]    03/13/2010  TO: Leodis Sias IT MAY CONCERN   RE: [employer] [address] [place]   The above named individual is under my medical care and may return to work on: August 25,2011  If you have any further questions or need additional information, please call.     Sincerely,       Dossie Arbour, MD

## 2010-08-23 NOTE — Letter (Signed)
Summary: Medical Record Release  Medical Record Release   Imported By: Harlon Flor 11/28/2009 15:57:25  _____________________________________________________________________  External Attachment:    Type:   Image     Comment:   External Document

## 2010-08-27 ENCOUNTER — Telehealth: Payer: Self-pay | Admitting: Internal Medicine

## 2010-09-05 NOTE — Progress Notes (Signed)
  Phone Note Call from Patient   Caller: Patient Details for Reason: Blood pressure 100/64 & 92/60 with heart rate of 64. Summary of Call: Patient feeling fatigue SpO2 90 to 95%, blood pressure 100/64 & 92/60 with heart rate of 64.  The patient had the employee nurse check vital signs. The employee nurse feels patient is pale.  Please call at [phone].   The patient should be taking lisinopril 10 mg 1/2 tablet once daily but has been taking the full tablet. He was a little short of breath while changing a light bulb and had some dizziness after changing the light bulb. Denies chest pain.  Does not feel short of breath other than changing the light bulb.  Please call for what to do?  Blood pressure normally ranges between 120/64 with heart rate 60 to 70's.  Initial call taken by: Bishop Dublin, CMA,  August 27, 2010 11:54 AM  Follow-up for Phone Call        Told the patient may need to cut back on dose of medication.  The patient will await our call. Follow-up by: Bishop Dublin, CMA,  August 27, 2010 1:51 PM  Additional Follow-up for Phone Call Additional follow up Details #1::        Notified patient's wife per Dr. Gala Romney have [name] decrease his lisinopril 10 mg 1/2 once daily, monitor blood pressure & heart rate with an increase in fluids may be dehydrated due to his dizziness.  He is to call our office if he doesn't seem to get any better with the shortness of breath.  Additional Follow-up by: Bishop Dublin, CMA,  August 27, 2010 5:11 PM    New/Updated Medications: LISINOPRIL 10 MG TABS (LISINOPRIL) Take one  tablet by mouth daily

## 2010-10-07 ENCOUNTER — Telehealth: Payer: Self-pay | Admitting: Cardiovascular Disease

## 2010-10-08 ENCOUNTER — Other Ambulatory Visit: Payer: Self-pay | Admitting: Cardiovascular Disease

## 2010-10-08 NOTE — Telephone Encounter (Signed)
Please send to [facility] [address].  This pharmacy is not in the system.

## 2010-10-09 ENCOUNTER — Other Ambulatory Visit: Payer: Self-pay | Admitting: Emergency Medicine

## 2010-10-09 MED ORDER — ISOSORBIDE MONONITRATE ER 30 MG PO TB24: 30.0000 mg | ORAL_TABLET | Freq: Every day | ORAL | Status: DC

## 2010-10-09 NOTE — Telephone Encounter (Signed)
rx called into pharmacy, [facility] [place].

## 2010-10-18 ENCOUNTER — Other Ambulatory Visit: Payer: Self-pay | Admitting: Cardiovascular Disease

## 2010-10-18 NOTE — Telephone Encounter (Signed)
rx called into pharmacy/[facility] 

## 2010-10-22 NOTE — Progress Notes (Signed)
Summary: RX  Phone Note Refill Request Call back at Home Phone [phone] Message from:  Wife on October 07, 2010 4:55 PM  Refills Requested: Medication #1:  nitroglycerin [facility]  Initial call taken by: Harlon Flor,  October 07, 2010 4:56 PM  Follow-up for Phone Call        sara refill isosorbide; the patient called and states not taking SL NTG and needs refill for isosorbide. Follow-up by: Bishop Dublin, CMA,  October 14, 2010 9:50 AM

## 2010-10-24 LAB — POCT I-STAT 3, ART BLOOD GAS (G3+)
Acid-Base Excess: 1 mmol/L (ref 0.0–2.0)
Acid-Base Excess: 3 mmol/L — ABNORMAL HIGH (ref 0.0–2.0)
Bicarbonate: 25.6 mEq/L — ABNORMAL HIGH (ref 20.0–24.0)
Bicarbonate: 26.6 mEq/L — ABNORMAL HIGH (ref 20.0–24.0)
Bicarbonate: 26.9 mEq/L — ABNORMAL HIGH (ref 20.0–24.0)
Bicarbonate: 27.7 mEq/L — ABNORMAL HIGH (ref 20.0–24.0)
O2 Saturation: 100 %
O2 Saturation: 94 %
O2 Saturation: 97 %
O2 Saturation: 98 %
Patient temperature: 31
Patient temperature: 35.1
Patient temperature: 37
Patient temperature: 37.7
TCO2: 27 mmol/L (ref 0–100)
TCO2: 28 mmol/L (ref 0–100)
TCO2: 28 mmol/L (ref 0–100)
TCO2: 29 mmol/L (ref 0–100)
pCO2 arterial: 30.8 mmHg — ABNORMAL LOW (ref 35.0–45.0)
pCO2 arterial: 45 mmHg (ref 35.0–45.0)
pCO2 arterial: 47 mmHg — ABNORMAL HIGH (ref 35.0–45.0)
pCO2 arterial: 47.7 mmHg — ABNORMAL HIGH (ref 35.0–45.0)
pH, Arterial: 7.344 — ABNORMAL LOW (ref 7.350–7.450)
pH, Arterial: 7.366 (ref 7.350–7.450)
pH, Arterial: 7.367 (ref 7.350–7.450)
pH, Arterial: 7.539 — ABNORMAL HIGH (ref 7.350–7.450)
pO2, Arterial: 112 mmHg — ABNORMAL HIGH (ref 80.0–100.0)
pO2, Arterial: 261 mmHg — ABNORMAL HIGH (ref 80.0–100.0)
pO2, Arterial: 68 mmHg — ABNORMAL LOW (ref 80.0–100.0)
pO2, Arterial: 94 mmHg (ref 80.0–100.0)

## 2010-10-24 LAB — BASIC METABOLIC PANEL
BUN: 10 mg/dL (ref 6–23)
BUN: 12 mg/dL (ref 6–23)
BUN: 22 mg/dL (ref 6–23)
BUN: 22 mg/dL (ref 6–23)
BUN: 24 mg/dL — ABNORMAL HIGH (ref 6–23)
CO2: 26 mEq/L (ref 19–32)
CO2: 26 mEq/L (ref 19–32)
CO2: 26 mEq/L (ref 19–32)
CO2: 28 mEq/L (ref 19–32)
CO2: 28 mEq/L (ref 19–32)
Calcium: 8.1 mg/dL — ABNORMAL LOW (ref 8.4–10.5)
Calcium: 8.3 mg/dL — ABNORMAL LOW (ref 8.4–10.5)
Calcium: 8.3 mg/dL — ABNORMAL LOW (ref 8.4–10.5)
Calcium: 8.4 mg/dL (ref 8.4–10.5)
Calcium: 8.6 mg/dL (ref 8.4–10.5)
Chloride: 100 mEq/L (ref 96–112)
Chloride: 103 mEq/L (ref 96–112)
Chloride: 110 mEq/L (ref 96–112)
Chloride: 97 mEq/L (ref 96–112)
Chloride: 99 mEq/L (ref 96–112)
Creatinine, Ser: 0.85 mg/dL (ref 0.4–1.5)
Creatinine, Ser: 0.89 mg/dL (ref 0.4–1.5)
Creatinine, Ser: 0.9 mg/dL (ref 0.4–1.5)
Creatinine, Ser: 0.9 mg/dL (ref 0.4–1.5)
Creatinine, Ser: 1.01 mg/dL (ref 0.4–1.5)
GFR calc Af Amer: >60 mL/min (ref >60)
GFR calc Af Amer: >60 mL/min (ref >60)
GFR calc Af Amer: >60 mL/min (ref >60)
GFR calc Af Amer: >60 mL/min (ref >60)
GFR calc Af Amer: >60 mL/min (ref >60)
GFR calc non Af Amer: >60 mL/min (ref >60)
GFR calc non Af Amer: >60 mL/min (ref >60)
GFR calc non Af Amer: >60 mL/min (ref >60)
GFR calc non Af Amer: >60 mL/min (ref >60)
GFR calc non Af Amer: >60 mL/min (ref >60)
Glucose, Bld: 121 mg/dL — ABNORMAL HIGH (ref 70–99)
Glucose, Bld: 121 mg/dL — ABNORMAL HIGH (ref 70–99)
Glucose, Bld: 130 mg/dL — ABNORMAL HIGH (ref 70–99)
Glucose, Bld: 140 mg/dL — ABNORMAL HIGH (ref 70–99)
Glucose, Bld: 141 mg/dL — ABNORMAL HIGH (ref 70–99)
Potassium: 3.9 mEq/L (ref 3.5–5.1)
Potassium: 4 mEq/L (ref 3.5–5.1)
Potassium: 4 mEq/L (ref 3.5–5.1)
Potassium: 4 mEq/L (ref 3.5–5.1)
Potassium: 4.2 mEq/L (ref 3.5–5.1)
Sodium: 133 mEq/L — ABNORMAL LOW (ref 135–145)
Sodium: 134 mEq/L — ABNORMAL LOW (ref 135–145)
Sodium: 134 mEq/L — ABNORMAL LOW (ref 135–145)
Sodium: 138 mEq/L (ref 135–145)
Sodium: 141 mEq/L (ref 135–145)

## 2010-10-24 LAB — CBC
HCT: 28 % — ABNORMAL LOW (ref 39.0–52.0)
HCT: 29.6 % — ABNORMAL LOW (ref 39.0–52.0)
HCT: 29.8 % — ABNORMAL LOW (ref 39.0–52.0)
HCT: 30.9 % — ABNORMAL LOW (ref 39.0–52.0)
HCT: 32.4 % — ABNORMAL LOW (ref 39.0–52.0)
HCT: 33.3 % — ABNORMAL LOW (ref 39.0–52.0)
HCT: 33.7 % — ABNORMAL LOW (ref 39.0–52.0)
HCT: 35 % — ABNORMAL LOW (ref 39.0–52.0)
HCT: 37 % — ABNORMAL LOW (ref 39.0–52.0)
HCT: 37.2 % — ABNORMAL LOW (ref 39.0–52.0)
Hemoglobin: 10.3 g/dL — ABNORMAL LOW (ref 13.0–17.0)
Hemoglobin: 10.4 g/dL — ABNORMAL LOW (ref 13.0–17.0)
Hemoglobin: 10.8 g/dL — ABNORMAL LOW (ref 13.0–17.0)
Hemoglobin: 11.2 g/dL — ABNORMAL LOW (ref 13.0–17.0)
Hemoglobin: 11.6 g/dL — ABNORMAL LOW (ref 13.0–17.0)
Hemoglobin: 11.8 g/dL — ABNORMAL LOW (ref 13.0–17.0)
Hemoglobin: 12.3 g/dL — ABNORMAL LOW (ref 13.0–17.0)
Hemoglobin: 13 g/dL (ref 13.0–17.0)
Hemoglobin: 13 g/dL (ref 13.0–17.0)
Hemoglobin: 9.8 g/dL — ABNORMAL LOW (ref 13.0–17.0)
MCHC: 34.5 g/dL (ref 30.0–36.0)
MCHC: 34.9 g/dL (ref 30.0–36.0)
MCHC: 34.9 g/dL (ref 30.0–36.0)
MCHC: 34.9 g/dL (ref 30.0–36.0)
MCHC: 34.9 g/dL (ref 30.0–36.0)
MCHC: 35 g/dL (ref 30.0–36.0)
MCHC: 35 g/dL (ref 30.0–36.0)
MCHC: 35.1 g/dL (ref 30.0–36.0)
MCHC: 35.2 g/dL (ref 30.0–36.0)
MCHC: 35.2 g/dL (ref 30.0–36.0)
MCV: 95.4 fL (ref 78.0–100.0)
MCV: 95.6 fL (ref 78.0–100.0)
MCV: 95.8 fL (ref 78.0–100.0)
MCV: 95.9 fL (ref 78.0–100.0)
MCV: 96 fL (ref 78.0–100.0)
MCV: 96.1 fL (ref 78.0–100.0)
MCV: 96.6 fL (ref 78.0–100.0)
MCV: 96.8 fL (ref 78.0–100.0)
MCV: 96.9 fL (ref 78.0–100.0)
MCV: 97.1 fL (ref 78.0–100.0)
Platelets: 117 10*3/uL — ABNORMAL LOW (ref 150–400)
Platelets: 122 10*3/uL — ABNORMAL LOW (ref 150–400)
Platelets: 123 10*3/uL — ABNORMAL LOW (ref 150–400)
Platelets: 129 10*3/uL — ABNORMAL LOW (ref 150–400)
Platelets: 132 10*3/uL — ABNORMAL LOW (ref 150–400)
Platelets: 134 10*3/uL — ABNORMAL LOW (ref 150–400)
Platelets: 140 10*3/uL — ABNORMAL LOW (ref 150–400)
Platelets: 165 10*3/uL (ref 150–400)
Platelets: 204 10*3/uL (ref 150–400)
Platelets: 292 10*3/uL (ref 150–400)
RBC: 2.93 MIL/uL — ABNORMAL LOW (ref 4.22–5.81)
RBC: 3.05 MIL/uL — ABNORMAL LOW (ref 4.22–5.81)
RBC: 3.08 MIL/uL — ABNORMAL LOW (ref 4.22–5.81)
RBC: 3.2 MIL/uL — ABNORMAL LOW (ref 4.22–5.81)
RBC: 3.34 MIL/uL — ABNORMAL LOW (ref 4.22–5.81)
RBC: 3.47 MIL/uL — ABNORMAL LOW (ref 4.22–5.81)
RBC: 3.51 MIL/uL — ABNORMAL LOW (ref 4.22–5.81)
RBC: 3.65 MIL/uL — ABNORMAL LOW (ref 4.22–5.81)
RBC: 3.88 MIL/uL — ABNORMAL LOW (ref 4.22–5.81)
RBC: 3.88 MIL/uL — ABNORMAL LOW (ref 4.22–5.81)
RDW: 13.1 % (ref 11.5–15.5)
RDW: 13.3 % (ref 11.5–15.5)
RDW: 13.3 % (ref 11.5–15.5)
RDW: 13.6 % (ref 11.5–15.5)
RDW: 13.7 % (ref 11.5–15.5)
RDW: 13.7 % (ref 11.5–15.5)
RDW: 13.8 % (ref 11.5–15.5)
RDW: 13.8 % (ref 11.5–15.5)
RDW: 13.9 % (ref 11.5–15.5)
RDW: 14 % (ref 11.5–15.5)
WBC: 12.4 10*3/uL — ABNORMAL HIGH (ref 4.0–10.5)
WBC: 12.8 10*3/uL — ABNORMAL HIGH (ref 4.0–10.5)
WBC: 13 10*3/uL — ABNORMAL HIGH (ref 4.0–10.5)
WBC: 13.1 10*3/uL — ABNORMAL HIGH (ref 4.0–10.5)
WBC: 13.4 10*3/uL — ABNORMAL HIGH (ref 4.0–10.5)
WBC: 14.3 10*3/uL — ABNORMAL HIGH (ref 4.0–10.5)
WBC: 15 10*3/uL — ABNORMAL HIGH (ref 4.0–10.5)
WBC: 16.5 10*3/uL — ABNORMAL HIGH (ref 4.0–10.5)
WBC: 17.3 10*3/uL — ABNORMAL HIGH (ref 4.0–10.5)
WBC: 8.4 10*3/uL (ref 4.0–10.5)

## 2010-10-24 LAB — POCT I-STAT 3, VENOUS BLOOD GAS (G3P V)
Acid-Base Excess: 2 mmol/L (ref 0.0–2.0)
Bicarbonate: 27.1 mEq/L — ABNORMAL HIGH (ref 20.0–24.0)
O2 Saturation: 78 %
Patient temperature: 31
TCO2: 28 mmol/L (ref 0–100)
pCO2, Ven: 32.5 mmHg — ABNORMAL LOW (ref 45.0–50.0)
pH, Ven: 7.503 — ABNORMAL HIGH (ref 7.250–7.300)
pO2, Ven: 27 mmHg — CL (ref 30.0–45.0)

## 2010-10-24 LAB — GLUCOSE, CAPILLARY
Glucose-Capillary: 100 mg/dL — ABNORMAL HIGH (ref 70–99)
Glucose-Capillary: 103 mg/dL — ABNORMAL HIGH (ref 70–99)
Glucose-Capillary: 107 mg/dL — ABNORMAL HIGH (ref 70–99)
Glucose-Capillary: 107 mg/dL — ABNORMAL HIGH (ref 70–99)
Glucose-Capillary: 112 mg/dL — ABNORMAL HIGH (ref 70–99)
Glucose-Capillary: 113 mg/dL — ABNORMAL HIGH (ref 70–99)
Glucose-Capillary: 113 mg/dL — ABNORMAL HIGH (ref 70–99)
Glucose-Capillary: 114 mg/dL — ABNORMAL HIGH (ref 70–99)
Glucose-Capillary: 117 mg/dL — ABNORMAL HIGH (ref 70–99)
Glucose-Capillary: 118 mg/dL — ABNORMAL HIGH (ref 70–99)
Glucose-Capillary: 118 mg/dL — ABNORMAL HIGH (ref 70–99)
Glucose-Capillary: 120 mg/dL — ABNORMAL HIGH (ref 70–99)
Glucose-Capillary: 123 mg/dL — ABNORMAL HIGH (ref 70–99)
Glucose-Capillary: 126 mg/dL — ABNORMAL HIGH (ref 70–99)
Glucose-Capillary: 130 mg/dL — ABNORMAL HIGH (ref 70–99)
Glucose-Capillary: 133 mg/dL — ABNORMAL HIGH (ref 70–99)
Glucose-Capillary: 140 mg/dL — ABNORMAL HIGH (ref 70–99)
Glucose-Capillary: 143 mg/dL — ABNORMAL HIGH (ref 70–99)
Glucose-Capillary: 143 mg/dL — ABNORMAL HIGH (ref 70–99)
Glucose-Capillary: 165 mg/dL — ABNORMAL HIGH (ref 70–99)
Glucose-Capillary: 94 mg/dL (ref 70–99)
Glucose-Capillary: 96 mg/dL (ref 70–99)
Glucose-Capillary: 96 mg/dL (ref 70–99)
Glucose-Capillary: 97 mg/dL (ref 70–99)

## 2010-10-24 LAB — CROSSMATCH
ABO/RH(D): O NEG
Antibody Screen: NEGATIVE

## 2010-10-24 LAB — POCT I-STAT 4, (NA,K, GLUC, HGB,HCT)
Glucose, Bld: 107 mg/dL — ABNORMAL HIGH (ref 70–99)
Glucose, Bld: 113 mg/dL — ABNORMAL HIGH (ref 70–99)
Glucose, Bld: 115 mg/dL — ABNORMAL HIGH (ref 70–99)
Glucose, Bld: 120 mg/dL — ABNORMAL HIGH (ref 70–99)
Glucose, Bld: 97 mg/dL (ref 70–99)
HCT: 25 % — ABNORMAL LOW (ref 39.0–52.0)
HCT: 29 % — ABNORMAL LOW (ref 39.0–52.0)
HCT: 30 % — ABNORMAL LOW (ref 39.0–52.0)
HCT: 32 % — ABNORMAL LOW (ref 39.0–52.0)
HCT: 34 % — ABNORMAL LOW (ref 39.0–52.0)
Hemoglobin: 10.2 g/dL — ABNORMAL LOW (ref 13.0–17.0)
Hemoglobin: 10.9 g/dL — ABNORMAL LOW (ref 13.0–17.0)
Hemoglobin: 11.6 g/dL — ABNORMAL LOW (ref 13.0–17.0)
Hemoglobin: 8.5 g/dL — ABNORMAL LOW (ref 13.0–17.0)
Hemoglobin: 9.9 g/dL — ABNORMAL LOW (ref 13.0–17.0)
Potassium: 3.6 mEq/L (ref 3.5–5.1)
Potassium: 3.8 mEq/L (ref 3.5–5.1)
Potassium: 3.8 mEq/L (ref 3.5–5.1)
Potassium: 3.9 mEq/L (ref 3.5–5.1)
Potassium: 4.1 mEq/L (ref 3.5–5.1)
Sodium: 136 mEq/L (ref 135–145)
Sodium: 140 mEq/L (ref 135–145)
Sodium: 140 mEq/L (ref 135–145)
Sodium: 141 mEq/L (ref 135–145)
Sodium: 143 mEq/L (ref 135–145)

## 2010-10-24 LAB — POCT I-STAT, CHEM 8
BUN: 10 mg/dL (ref 6–23)
Calcium, Ion: 1.15 mmol/L (ref 1.12–1.32)
Chloride: 107 mEq/L (ref 96–112)
Creatinine, Ser: 0.8 mg/dL (ref 0.4–1.5)
Glucose, Bld: 131 mg/dL — ABNORMAL HIGH (ref 70–99)
HCT: 36 % — ABNORMAL LOW (ref 39.0–52.0)
Hemoglobin: 12.2 g/dL — ABNORMAL LOW (ref 13.0–17.0)
Potassium: 4.4 mEq/L (ref 3.5–5.1)
Sodium: 140 mEq/L (ref 135–145)
TCO2: 23 mmol/L (ref 0–100)

## 2010-10-24 LAB — URINALYSIS, MICROSCOPIC ONLY
Bilirubin Urine: NEGATIVE
Glucose, UA: NEGATIVE mg/dL
Hgb urine dipstick: NEGATIVE
Ketones, ur: NEGATIVE mg/dL
Leukocytes, UA: NEGATIVE
Nitrite: NEGATIVE
Protein, ur: NEGATIVE mg/dL
Specific Gravity, Urine: 1.038 — ABNORMAL HIGH (ref 1.005–1.030)
Urobilinogen, UA: 1 mg/dL (ref 0.0–1.0)
pH: 6 (ref 5.0–8.0)

## 2010-10-24 LAB — COMPREHENSIVE METABOLIC PANEL
ALT: 20 U/L (ref 0–53)
AST: 18 U/L (ref 0–37)
Albumin: 3.6 g/dL (ref 3.5–5.2)
Alkaline Phosphatase: 75 U/L (ref 39–117)
BUN: 12 mg/dL (ref 6–23)
CO2: 29 mEq/L (ref 19–32)
Calcium: 8.9 mg/dL (ref 8.4–10.5)
Chloride: 104 mEq/L (ref 96–112)
Creatinine, Ser: 0.93 mg/dL (ref 0.4–1.5)
GFR calc Af Amer: >60 mL/min (ref >60)
GFR calc non Af Amer: >60 mL/min (ref >60)
Glucose, Bld: 110 mg/dL — ABNORMAL HIGH (ref 70–99)
Potassium: 3.9 mEq/L (ref 3.5–5.1)
Sodium: 138 mEq/L (ref 135–145)
Total Bilirubin: 1.2 mg/dL (ref 0.3–1.2)
Total Protein: 6.4 g/dL (ref 6.0–8.3)

## 2010-10-24 LAB — DIFFERENTIAL
Basophils Absolute: 0 10*3/uL (ref 0.0–0.1)
Basophils Relative: 0 % (ref 0–1)
Eosinophils Absolute: 0.1 10*3/uL (ref 0.0–0.7)
Eosinophils Relative: 1 % (ref 0–5)
Lymphocytes Relative: 12 % (ref 12–46)
Lymphs Abs: 1.5 10*3/uL (ref 0.7–4.0)
Monocytes Absolute: 1.2 10*3/uL — ABNORMAL HIGH (ref 0.1–1.0)
Monocytes Relative: 9 % (ref 3–12)
Neutro Abs: 10.1 10*3/uL — ABNORMAL HIGH (ref 1.7–7.7)
Neutrophils Relative %: 78 % — ABNORMAL HIGH (ref 43–77)

## 2010-10-24 LAB — PLATELET COUNT: Platelets: 129 10*3/uL — ABNORMAL LOW (ref 150–400)

## 2010-10-24 LAB — APTT
aPTT: 31 seconds (ref 24–37)
aPTT: 33 seconds (ref 24–37)

## 2010-10-24 LAB — CREATININE, SERUM
Creatinine, Ser: 0.76 mg/dL (ref 0.4–1.5)
Creatinine, Ser: 1.14 mg/dL (ref 0.4–1.5)
GFR calc Af Amer: >60 mL/min (ref >60)
GFR calc Af Amer: >60 mL/min (ref >60)
GFR calc non Af Amer: >60 mL/min (ref >60)
GFR calc non Af Amer: >60 mL/min (ref >60)

## 2010-10-24 LAB — HEMOGLOBIN A1C
Hgb A1c MFr Bld: 6 % (ref 4.6–6.1)
Mean Plasma Glucose: 126 mg/dL

## 2010-10-24 LAB — MAGNESIUM
Magnesium: 2.2 mg/dL (ref 1.5–2.5)
Magnesium: 2.3 mg/dL (ref 1.5–2.5)
Magnesium: 2.8 mg/dL — ABNORMAL HIGH (ref 1.5–2.5)

## 2010-10-24 LAB — CK TOTAL AND CKMB (NOT AT ARMC)
CK, MB: 9.1 ng/mL — ABNORMAL HIGH (ref 0.3–4.0)
Relative Index: 2.3 (ref 0.0–2.5)
Total CK: 397 U/L — ABNORMAL HIGH (ref 7–232)

## 2010-10-24 LAB — URINE CULTURE
Colony Count: NO GROWTH
Culture: NO GROWTH
Culture: NO GROWTH
Special Requests: NEGATIVE

## 2010-10-24 LAB — ABO/RH: ABO/RH(D): O NEG

## 2010-10-24 LAB — HEMOGLOBIN AND HEMATOCRIT, BLOOD
HCT: 26.2 % — ABNORMAL LOW (ref 39.0–52.0)
Hemoglobin: 9.2 g/dL — ABNORMAL LOW (ref 13.0–17.0)

## 2010-10-24 LAB — URINE MICROSCOPIC-ADD ON

## 2010-10-24 LAB — URINALYSIS, ROUTINE W REFLEX MICROSCOPIC
Nitrite: NEGATIVE
Specific Gravity, Urine: 1.028 (ref 1.005–1.030)
Urobilinogen, UA: 1 mg/dL (ref 0.0–1.0)

## 2010-10-24 LAB — POCT I-STAT GLUCOSE
Glucose, Bld: 105 mg/dL — ABNORMAL HIGH (ref 70–99)
Operator id: 3390

## 2010-10-24 LAB — PROTIME-INR
INR: 1.09 (ref 0.00–1.49)
INR: 1.28 (ref 0.00–1.49)
Prothrombin Time: 14 seconds (ref 11.6–15.2)
Prothrombin Time: 15.9 seconds — ABNORMAL HIGH (ref 11.6–15.2)

## 2010-10-24 LAB — CARDIAC PANEL(CRET KIN+CKTOT+MB+TROPI)
CK, MB: 1.2 ng/mL (ref 0.3–4.0)
Relative Index: INVALID (ref 0.0–2.5)
Total CK: 75 U/L (ref 7–232)
Troponin I: 0.03 ng/mL (ref 0.00–0.06)

## 2010-10-24 LAB — HEPARIN LEVEL (UNFRACTIONATED): Heparin Unfractionated: 0.52 IU/mL (ref 0.30–0.70)

## 2010-11-18 ENCOUNTER — Other Ambulatory Visit: Payer: Self-pay | Admitting: Emergency Medicine

## 2010-11-18 MED ORDER — ROSUVASTATIN CALCIUM 20 MG PO TABS: 20.0000 mg | ORAL_TABLET | Freq: Every day | ORAL | Status: DC

## 2010-12-02 ENCOUNTER — Encounter: Payer: Self-pay | Admitting: Cardiovascular Disease

## 2010-12-02 ENCOUNTER — Ambulatory Visit (INDEPENDENT_AMBULATORY_CARE_PROVIDER_SITE_OTHER): Payer: PRIVATE HEALTH INSURANCE | Admitting: Cardiovascular Disease

## 2010-12-02 DIAGNOSIS — Z87891 Personal history of nicotine dependence: Secondary | ICD-10-CM

## 2010-12-02 DIAGNOSIS — I25810 Atherosclerosis of coronary artery bypass graft(s) without angina pectoris: Secondary | ICD-10-CM

## 2010-12-02 DIAGNOSIS — I10 Essential (primary) hypertension: Secondary | ICD-10-CM

## 2010-12-02 DIAGNOSIS — E7800 Pure hypercholesterolemia, unspecified: Secondary | ICD-10-CM

## 2010-12-02 LAB — HEPATIC FUNCTION PANEL
ALT: 22 U/L (ref 0–53)
AST: 19 U/L (ref 0–37)
Bilirubin, Direct: 0.2 mg/dL (ref 0.0–0.3)
Indirect Bilirubin: 0.7 mg/dL (ref 0.0–0.9)

## 2010-12-02 LAB — LIPID PANEL: Cholesterol: 126 mg/dL (ref 0–200)

## 2010-12-02 MED ORDER — TADALAFIL 5 MG PO TABS: 5.0000 mg | ORAL_TABLET | Freq: Every day | ORAL | Status: AC | PRN

## 2010-12-02 NOTE — Patient Instructions (Signed)
You are doing well. No medication changes were made. Please call us if you have new issues that need to be addressed before your next appt.  We will call you for a follow up Appt. In 6 months  

## 2010-12-02 NOTE — Progress Notes (Signed)
   Patient ID: [name], male    DOB: 01-May-1946, 65 y.o.   MRN: [id number]  HPI Comments: Mr. Harriott is a 65 year old gentleman with a history of smoking, coronary artery disease, occluded LAD in the mid vessel, 60% left main, 99% distal RCA who was sent to [facility] in October 2004 bypass surgery. He received 3 vessel bypass by Dr.Gerhardt with a LIMA to the LAD, vein graft to the OM1 and vein graft to the PDA, with subsequent cardiac catheter March 2011 for chest discomfort showing occluded vein grafts x2 with patent LIMA. He has collateral vessels from left to right. Ejection fraction 55% mild anterolateral hypokinesis.   he presents for routine followup. He feels well, is walking significant distances. 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 No significant chest pain. He does have occasional shortness of breath with heavy exertion. Otherwise he feels well. he does get bilateral knee pain. 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   No recent cholesterol. Last in May showing elevated lipids above goal. I believe this is on Crestor 10 mg daily      Review of Systems  Constitutional: Negative.   HENT: Negative.   Eyes: Negative.   Respiratory: Negative.   Cardiovascular: Negative.   Gastrointestinal: Negative.   Musculoskeletal: Negative.   Skin: Negative.   Neurological: Negative.   Hematological: Negative.   Psychiatric/Behavioral: Negative.   All other systems reviewed and are negative.   BP 124/70  Pulse 60  Ht 5\' 9"  (1.753 m)  Wt 226 lb (102.513 kg)  BMI 33.37 kg/m2    Physical Exam  Nursing note and vitals reviewed. Constitutional: He is oriented to person, place, and time. He appears well-developed and well-nourished.  HENT:  Head: Normocephalic.  Nose: Nose normal.  Mouth/Throat: Oropharynx is clear and moist.  Eyes: Conjunctivae are normal. Pupils  are equal, round, and reactive to light.  Neck: Normal range of motion. Neck supple. No JVD present.  Cardiovascular: Normal rate, regular rhythm, S1 normal, S2 normal, normal heart sounds and intact distal pulses.  Exam reveals no gallop and no friction rub.   No murmur heard. Pulmonary/Chest: Effort normal and breath sounds normal. No respiratory distress. He has no wheezes. He has no rales. He exhibits no tenderness.  Abdominal: Soft. Bowel sounds are normal. He exhibits no distension. There is no tenderness.  Musculoskeletal: Normal range of motion. He exhibits no edema and no tenderness.  Lymphadenopathy:    He has no cervical adenopathy.  Neurological: He is alert and oriented to person, place, and time. Coordination normal.  Skin: Skin is warm and dry. No rash noted. No erythema.  Psychiatric: He has a normal mood and affect. His behavior is normal. Judgment and thought content normal.           Assessment and Plan

## 2010-12-03 ENCOUNTER — Encounter: Payer: Self-pay | Admitting: Cardiovascular Disease

## 2010-12-03 NOTE — Assessment & Plan Note (Signed)
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  On May 09, 2009, he had  coronary artery bypass grafting x3 with left internal mammary to the  LAD, reverse saphenous vein graft to the posterior descending, reverse  saphenous vein graft to the first obtuse marginal with right thigh  endovein harvesting.  Overall 3 weeks, he seems to be making reasonable  progress.  Had some chest wall pain and left chest wall numbness  probably associated with use of mammary artery.  He has had no shortness  of breath.  He is increasing his physical activity walking around [facility] today.   PHYSICAL EXAMINATION:  His blood pressure 153/86, pulse 50, respiratory  rate is 18, O2 sat on room air is 98%.  His sternum is stable and well  healed.  Lungs are clear without wheezing.  I do not appreciate any  pleural rubs.  Lower extremities are without edema or tenderness.   He continues on aspirin 325 a day, amiodarone 200 b.i.d., I have asked  him to decrease this to 200 once a day and then discussed with Dr.  Sink  in January discontinuing it; metoprolol 50 mg b.i.d. and pravastatin 20  mg a day.  Follow up chest x-ray showed some slight blunting of the left  costophrenic angle, otherwise clear lung fields.   Overall, I am very pleased with his postop progress and made him return  appointment to see me in approximately 6 weeks.  We have extended his  wife's leave until December 13, after he is enrolled and started in  cardiac rehab.   Sheliah Plane, MD  Electronically Signed   EG/MEDQ  D:  06/07/2009  T:  06/08/2009  Job:  [id number]   cc:   Lorine Bears, MD

## 2010-12-04 NOTE — Assessment & Plan Note (Signed)
We have suggested he come in for cholesterol check this week or next week to make sure that he is at goal. We will continue his current medications for now.

## 2010-12-04 NOTE — Assessment & Plan Note (Signed)
Currently with no symptoms of angina. No further workup at this time. Continue current medication regimen. 

## 2010-12-04 NOTE — Assessment & Plan Note (Signed)
He has stopped smoking

## 2010-12-04 NOTE — Assessment & Plan Note (Signed)
Blood pressure is well controlled on today's visit. No changes made to the medications. 

## 2011-01-06 ENCOUNTER — Other Ambulatory Visit: Payer: Self-pay | Admitting: Cardiovascular Disease

## 2011-02-18 ENCOUNTER — Encounter: Payer: Self-pay | Admitting: Cardiovascular Disease

## 2011-02-18 ENCOUNTER — Encounter: Payer: Self-pay | Admitting: *Deleted

## 2011-02-18 ENCOUNTER — Ambulatory Visit (INDEPENDENT_AMBULATORY_CARE_PROVIDER_SITE_OTHER): Payer: PRIVATE HEALTH INSURANCE | Admitting: Cardiovascular Disease

## 2011-02-18 DIAGNOSIS — I10 Essential (primary) hypertension: Secondary | ICD-10-CM

## 2011-02-18 DIAGNOSIS — IMO0001 Reserved for inherently not codable concepts without codable children: Secondary | ICD-10-CM

## 2011-02-18 DIAGNOSIS — M791 Myalgia, unspecified site: Secondary | ICD-10-CM

## 2011-02-18 DIAGNOSIS — R609 Edema, unspecified: Secondary | ICD-10-CM

## 2011-02-18 DIAGNOSIS — M79609 Pain in unspecified limb: Secondary | ICD-10-CM

## 2011-02-18 DIAGNOSIS — R0602 Shortness of breath: Secondary | ICD-10-CM

## 2011-02-18 DIAGNOSIS — M79606 Pain in leg, unspecified: Secondary | ICD-10-CM

## 2011-02-18 DIAGNOSIS — I25810 Atherosclerosis of coronary artery bypass graft(s) without angina pectoris: Secondary | ICD-10-CM

## 2011-02-18 DIAGNOSIS — E7800 Pure hypercholesterolemia, unspecified: Secondary | ICD-10-CM

## 2011-02-18 NOTE — Assessment & Plan Note (Signed)
Currently with no symptoms of angina. No further workup at this time. Holding the statins secondary to leg pain.

## 2011-02-18 NOTE — Progress Notes (Signed)
Patient ID: [name], male    DOB: [birth year], 65 y.o.   MRN: [id number]  HPI Comments: Mr. Kopka is a 65 year old gentleman with a history of smoking, coronary artery disease, occluded LAD in the mid vessel, 60% left main, 99% distal RCA who was sent to [facility] in October 2004 bypass surgery. He received 3 vessel bypass by Dr.Gerhardt with a LIMA to the LAD, vein graft to the OM1 and vein graft to the PDA, with subsequent cardiac catheter March 2011 for chest discomfort showing occluded vein grafts x2 with patent LIMA. He has collateral vessels from left to right. Ejection fraction 55% mild anterolateral hypokinesis.    He reports that he has chronic leg pain since he was young. He also reports having leg pain prior to his bypass surgery. He has had worsening leg pain over the past week or so with symptoms that have been severe. He is unable to sit for any length of time, and has diffuse aching from his hips down to his feet. He is unable to isolate one particular muscle and reports the symptoms are diffuse and severe. He denies any recent new medications, recent heavy trauma or activity. He denies any chest pain. He does have some shortness of breath. He has been taking oxycodone over the past several nights for sleep secondary to severe pain. No discoloration in his feet. He is able to walk though with significant discomfort.  EKG shows normal sinus rhythm with rate 62 beats per minute with no significant ST or T-wave changes        Outpatient Encounter Prescriptions as of 02/18/2011  Medication Sig Dispense Refill  . amLODipine (NORVASC) 10 MG tablet TAKE ONE TABLET BY MOUTH EVERY DAY  30 tablet  6  . aspirin 325 MG EC tablet Take 325 mg by mouth daily.        Marland Kitchen ezetimibe (ZETIA) 10 MG tablet Take 10 mg by mouth daily.        . isosorbide mononitrate (IMDUR) 30 MG 24 hr tablet Take 1 tablet (30 mg total) by mouth daily.  30 tablet  6  . lisinopril (PRINIVIL,ZESTRIL) 10 MG tablet 10 mg.  Take 1/2 tablet daily      . metoprolol (LOPRESSOR) 50 MG tablet 50 mg. Take 1/2 tablet by mouth twice a day.       . rosuvastatin (CRESTOR) 20 MG tablet 20 mg. Take 1/2 tablet daily.          Review of Systems  Constitutional: Negative.   HENT: Negative.   Eyes: Negative.   Respiratory: Positive for shortness of breath.   Cardiovascular: Negative.   Gastrointestinal: Negative.   Musculoskeletal: Positive for myalgias, joint swelling, arthralgias and gait problem.  Skin: Negative.   Neurological: Negative.   Hematological: Negative.   Psychiatric/Behavioral: Negative.   All other systems reviewed and are negative.    BP 142/83  Pulse 66  Wt 229 lb (103.874 kg)  Physical Exam  Nursing note and vitals reviewed. Constitutional: He is oriented to person, place, and time. He appears well-developed and well-nourished.       Difficulty standing and ambulating secondary to leg pain.  HENT:  Head: Normocephalic.  Nose: Nose normal.  Mouth/Throat: Oropharynx is clear and moist.  Eyes: Conjunctivae are normal. Pupils are equal, round, and reactive to light.  Neck: Normal range of motion. Neck supple. No JVD present.  Cardiovascular: Normal rate, regular rhythm, S1 normal, S2 normal, normal heart sounds and intact distal pulses.  Exam reveals no gallop and no friction rub.   No murmur heard. Pulmonary/Chest: Effort normal and breath sounds normal. No respiratory distress. He has no wheezes. He has no rales. He exhibits no tenderness.  Abdominal: Soft. Bowel sounds are normal. He exhibits no distension. There is no tenderness.  Musculoskeletal: Normal range of motion. He exhibits no edema and no tenderness.  Lymphadenopathy:    He has no cervical adenopathy.  Neurological: He is alert and oriented to person, place, and time. Coordination normal.  Skin: Skin is warm and dry. No rash noted. No erythema.  Psychiatric: He has a normal mood and affect. His behavior is normal. Judgment and  thought content normal.           Assessment and Plan

## 2011-02-18 NOTE — Assessment & Plan Note (Signed)
Etiology of his leg pain is uncertain. It does not appear to be claudication as he has reasonable peripheral pulses. We will check blood work including cardiac enzymes as he reports having leg pain prior to his bypass. We will check CK, as well as renal function and electrolytes. We have suggested he hold his Crestor and zetia. We will also check an ESR.

## 2011-02-18 NOTE — Assessment & Plan Note (Signed)
We will hold his statin secondary to possible myalgias from the Crestor.

## 2011-02-18 NOTE — Assessment & Plan Note (Signed)
Elevated blood pressure today in the setting of severe leg pain.

## 2011-02-18 NOTE — Patient Instructions (Signed)
Please hold crestor and zetia. This could be causing your leg pain.  Continue pain medication for your leg pain. If you have worsening leg pain not relieved with pain medication, please go to the ER. Please call us if you have new issues that need to be addressed before your next appt.

## 2011-02-25 ENCOUNTER — Other Ambulatory Visit: Payer: Self-pay

## 2011-02-25 ENCOUNTER — Telehealth: Payer: Self-pay

## 2011-02-25 NOTE — Telephone Encounter (Signed)
Wanted to know what to do about cholesterol medication now that legs are much better off the zetia & crestor.  Please call with what to do.

## 2011-02-25 NOTE — Telephone Encounter (Signed)
Notified patient's wife have [name] go back on the zetia but remain off the crestor for one more week then the patient will contact our office for more information on the next dose of cholesterol medication to try.  The patient will pick up a return to work note in the am.

## 2011-02-25 NOTE — Progress Notes (Signed)
Needs a letter to return to work.  Notified patient's wife need to pick up in the am.

## 2011-02-25 NOTE — Telephone Encounter (Signed)
Would restart zetia. Note ok to go back to work

## 2011-03-05 ENCOUNTER — Telehealth: Payer: Self-pay

## 2011-03-05 NOTE — Telephone Encounter (Signed)
zetia should not cause muscle discomfort...,typcially. Not a statin. Works on GI tract

## 2011-03-05 NOTE — Telephone Encounter (Signed)
The patient is calling to let Dr. Mariah Milling know he has some muscle ache, leg pain with the zetia but not like on the crestor.  Do you want him to continue on the zetia?  He states the muscle ache, leg pain is nothing like while on the crestor.  Please call with what to do. The patient is about out of zetia if you want him to continue.

## 2011-03-06 NOTE — Telephone Encounter (Signed)
Patient will stay on zetia.  What other cholesterol medication should it start on now.  The crestor must have been the cause of muscle ache, leg pain.  Please advise.

## 2011-03-06 NOTE — Telephone Encounter (Signed)
LMOM to call the office regarding cholesterol medication.

## 2011-03-06 NOTE — Telephone Encounter (Signed)
Could try lipitor 10 mg daily? Recheck labs in 2 months (cholesterol)

## 2011-03-06 NOTE — Telephone Encounter (Signed)
Notified patient need to continue with the zetia, does not normally cause muscle aches can cause problems with GI upset.  The patient will stay on the zetia but would like to know what cholesterol medication to try since most of the muscle aches he had was coming from the crestor.

## 2011-03-07 MED ORDER — ATORVASTATIN CALCIUM 10 MG PO TABS: 10.0000 mg | ORAL_TABLET | Freq: Every day | ORAL | Status: DC

## 2011-03-07 NOTE — Telephone Encounter (Signed)
Spoke to pt's wife, notified of msg below. Pt will take Zetia 10mg  and will start Lipitor 10mg  once daily in place of crestor due to side effects. Notified her that I have sent in Rx to his pharmacy, and he is scheduled for lipid/lft in 2 months. Pt will call back with any questions.

## 2011-03-24 ENCOUNTER — Other Ambulatory Visit: Payer: Self-pay | Admitting: Cardiovascular Disease

## 2011-03-25 ENCOUNTER — Telehealth: Payer: Self-pay

## 2011-03-25 MED ORDER — METOPROLOL TARTRATE 50 MG PO TABS: ORAL_TABLET | ORAL | Status: DC

## 2011-03-25 NOTE — Telephone Encounter (Signed)
Refill sent for metoprolol to [facility].

## 2011-04-04 ENCOUNTER — Other Ambulatory Visit: Payer: Self-pay

## 2011-04-04 DIAGNOSIS — I2510 Atherosclerotic heart disease of native coronary artery without angina pectoris: Secondary | ICD-10-CM

## 2011-04-04 DIAGNOSIS — Z79899 Other long term (current) drug therapy: Secondary | ICD-10-CM

## 2011-04-04 DIAGNOSIS — E785 Hyperlipidemia, unspecified: Secondary | ICD-10-CM

## 2011-04-14 ENCOUNTER — Other Ambulatory Visit: Payer: Self-pay | Admitting: Cardiovascular Disease

## 2011-05-06 ENCOUNTER — Ambulatory Visit (INDEPENDENT_AMBULATORY_CARE_PROVIDER_SITE_OTHER): Payer: PRIVATE HEALTH INSURANCE | Admitting: *Deleted

## 2011-05-06 DIAGNOSIS — I2510 Atherosclerotic heart disease of native coronary artery without angina pectoris: Secondary | ICD-10-CM

## 2011-05-06 DIAGNOSIS — E785 Hyperlipidemia, unspecified: Secondary | ICD-10-CM

## 2011-05-06 DIAGNOSIS — Z79899 Other long term (current) drug therapy: Secondary | ICD-10-CM

## 2011-05-14 ENCOUNTER — Telehealth: Payer: Self-pay | Admitting: *Deleted

## 2011-05-14 DIAGNOSIS — E7800 Pure hypercholesterolemia, unspecified: Secondary | ICD-10-CM

## 2011-05-14 NOTE — Telephone Encounter (Signed)
Please see labs (scanned in 10/16) lipid/lft. Pt notified of results. Labs have incr some since 11/2010. He is still taking lipitor 10mg  and zetia. Do you want pt to make any changes or continue same dosage. Thanks.

## 2011-05-15 NOTE — Telephone Encounter (Signed)
Would slowly go up on lipitor Start with 20 mg every other day with 10 mg for three months. Then later could try 20 mg daily With zetia 10 mg a day

## 2011-05-15 NOTE — Telephone Encounter (Signed)
Attempted to contact pt, LMOM TCB.  

## 2011-05-16 NOTE — Telephone Encounter (Signed)
Notified pt's wife of recc below. He will take Lipitor 20mg  QOD and continue Zetia 10mg  daily for 3 months, we will recheck labs in January. Then will consider Lipitor 20mg  daily. Pt will call in the meantime for any problems with side effects.

## 2011-07-16 ENCOUNTER — Other Ambulatory Visit: Payer: Self-pay | Admitting: Cardiovascular Disease

## 2011-07-17 ENCOUNTER — Telehealth: Payer: Self-pay

## 2011-07-17 MED ORDER — ISOSORBIDE MONONITRATE ER 30 MG PO TB24: 30.0000 mg | ORAL_TABLET | Freq: Every day | ORAL | Status: DC

## 2011-07-17 NOTE — Telephone Encounter (Signed)
Refill sent for imdur 30 mg  

## 2011-07-22 DIAGNOSIS — I4891 Unspecified atrial fibrillation: Secondary | ICD-10-CM

## 2011-07-22 HISTORY — DX: Unspecified atrial fibrillation: I48.91

## 2011-08-12 ENCOUNTER — Other Ambulatory Visit: Payer: PRIVATE HEALTH INSURANCE | Admitting: *Deleted

## 2011-08-14 ENCOUNTER — Ambulatory Visit (INDEPENDENT_AMBULATORY_CARE_PROVIDER_SITE_OTHER): Payer: Medicare Other | Admitting: *Deleted

## 2011-08-14 DIAGNOSIS — E7800 Pure hypercholesterolemia, unspecified: Secondary | ICD-10-CM

## 2011-08-15 ENCOUNTER — Other Ambulatory Visit: Payer: PRIVATE HEALTH INSURANCE | Admitting: *Deleted

## 2011-08-15 LAB — LIPID PANEL
Chol/HDL Ratio: 3.8 ratio units (ref 0.0–5.0)
Triglycerides: 99 mg/dL (ref 0–149)

## 2011-08-15 LAB — HEPATIC FUNCTION PANEL
ALT: 28 IU/L (ref 0–44)
Bilirubin, Direct: 0.22 mg/dL (ref 0.00–0.40)
Total Bilirubin: 0.9 mg/dL (ref 0.0–1.2)

## 2011-09-26 ENCOUNTER — Inpatient Hospital Stay: Payer: Self-pay

## 2011-09-26 ENCOUNTER — Telehealth: Payer: Self-pay | Admitting: *Deleted

## 2011-09-26 ENCOUNTER — Telehealth: Payer: Self-pay | Admitting: Cardiovascular Disease

## 2011-09-26 DIAGNOSIS — R0602 Shortness of breath: Secondary | ICD-10-CM

## 2011-09-26 DIAGNOSIS — I4891 Unspecified atrial fibrillation: Secondary | ICD-10-CM

## 2011-09-26 DIAGNOSIS — I059 Rheumatic mitral valve disease, unspecified: Secondary | ICD-10-CM

## 2011-09-26 DIAGNOSIS — I10 Essential (primary) hypertension: Secondary | ICD-10-CM

## 2011-09-26 DIAGNOSIS — I25810 Atherosclerosis of coronary artery bypass graft(s) without angina pectoris: Secondary | ICD-10-CM

## 2011-09-26 LAB — COMPREHENSIVE METABOLIC PANEL
Albumin: 3.7 g/dL (ref 3.4–5.0)
Anion Gap: 11 (ref 7–16)
BUN: 17 mg/dL (ref 7–18)
Bilirubin,Total: 0.7 mg/dL (ref 0.2–1.0)
Calcium, Total: 8.2 mg/dL — ABNORMAL LOW (ref 8.5–10.1)
Creatinine: 0.84 mg/dL (ref 0.60–1.30)
Glucose: 109 mg/dL — ABNORMAL HIGH (ref 65–99)
Osmolality: 287 (ref 275–301)
Potassium: 4.1 mmol/L (ref 3.5–5.1)
SGOT(AST): 26 U/L (ref 15–37)
Sodium: 143 mmol/L (ref 136–145)
Total Protein: 6.7 g/dL (ref 6.4–8.2)

## 2011-09-26 LAB — CBC
HCT: 40.1 % (ref 40.0–52.0)
HGB: 13.7 g/dL (ref 13.0–18.0)
MCH: 32.4 pg (ref 26.0–34.0)
MCHC: 34.1 g/dL (ref 32.0–36.0)
MCV: 95 fL (ref 80–100)
RDW: 13.7 % (ref 11.5–14.5)

## 2011-09-26 LAB — TROPONIN I: Troponin-I: 0.07 ng/mL — ABNORMAL HIGH

## 2011-09-26 LAB — MAGNESIUM: Magnesium: 1.7 mg/dL — ABNORMAL LOW

## 2011-09-26 LAB — CK TOTAL AND CKMB (NOT AT ARMC): CK-MB: 3.4 ng/mL (ref 0.5–3.6)

## 2011-09-26 LAB — URINALYSIS, COMPLETE
Bacteria: NONE SEEN
Glucose,UR: NEGATIVE mg/dL (ref 0–75)
Ketone: NEGATIVE
Leukocyte Esterase: NEGATIVE
Nitrite: NEGATIVE
Ph: 6 (ref 4.5–8.0)
Specific Gravity: 1.014 (ref 1.003–1.030)

## 2011-09-26 IMAGING — CR DG CHEST 1V PORT
1 series · 1 of 1 positions shown · non-contrast
Comparison: none

REASON FOR EXAM: chest pain
COMMENTS:

PROCEDURE:     DXR - DXR PORTABLE CHEST SINGLE VIEW  - [DATE] [DATE]
RESULT:     Comparison: [DATE]

[portable]
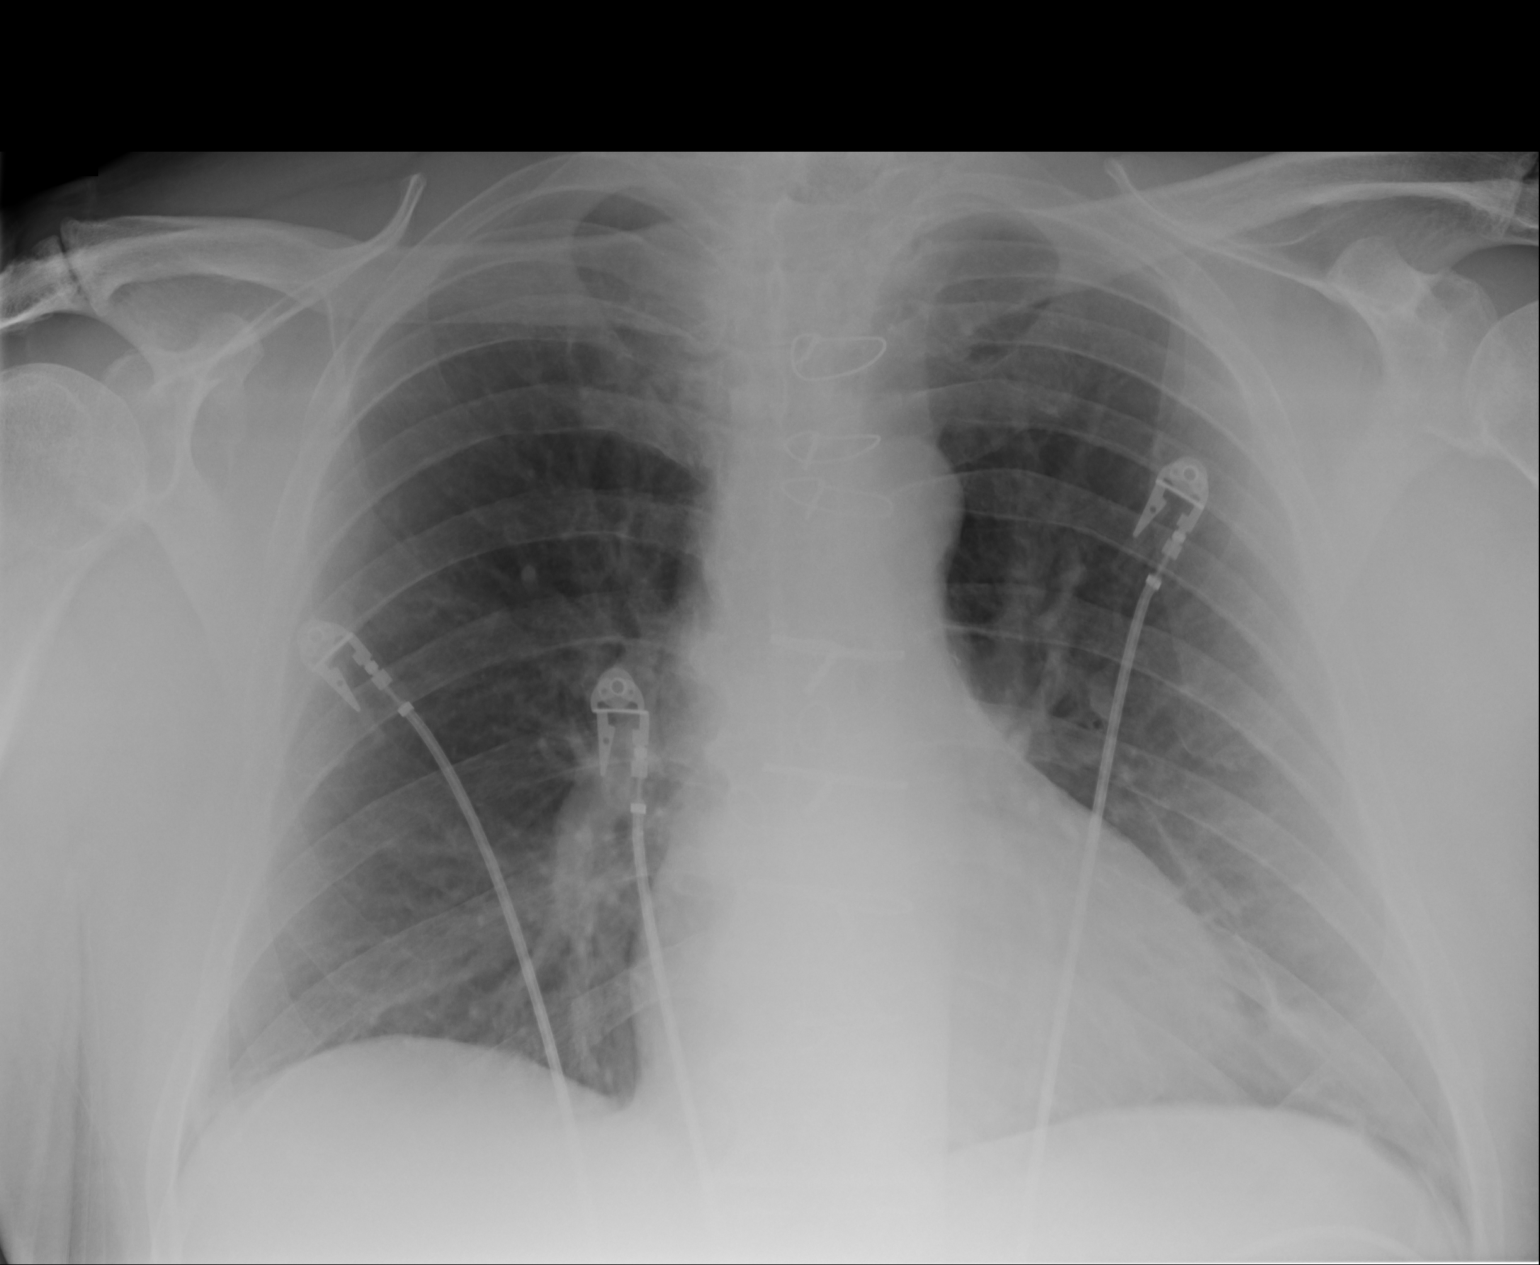

[1 of 1 positions shown; findings below may reference images not displayed]

FINDINGS: Single portable AP chest radiograph is provided.  There is no focal
parenchymal opacity, pleural effusion, or pneumothorax. Normal
cardiomediastinal silhouette. There is evidence of prior CABG. The osseous
structures are unremarkable.
IMPRESSION: No acute disease of the che[REDACTED]

## 2011-09-26 NOTE — Telephone Encounter (Signed)
Pt wife calling states that pt BP is 89/54 and pulse 88. C/o of being very tired and breathing really hard.

## 2011-09-26 NOTE — Telephone Encounter (Signed)
09/26/11--called pt and at that time EMS was at the home with his wife stating he has been weak,nauseated and now has irregular pulse--the paramedics state they will evaluate and probably take pt to ED--NT

## 2011-09-27 LAB — CBC WITH DIFFERENTIAL/PLATELET
Basophil #: 0 10*3/uL (ref 0.0–0.1)
Eosinophil #: 0.4 10*3/uL (ref 0.0–0.7)
HGB: 13.1 g/dL (ref 13.0–18.0)
Lymphocyte #: 2.9 10*3/uL (ref 1.0–3.6)
Lymphocyte %: 42.8 %
MCHC: 33.7 g/dL (ref 32.0–36.0)
Monocyte #: 0.7 10*3/uL (ref 0.0–0.7)
Neutrophil #: 2.8 10*3/uL (ref 1.4–6.5)
Neutrophil %: 41.1 %
Platelet: 114 10*3/uL — ABNORMAL LOW (ref 150–440)
RDW: 14 % (ref 11.5–14.5)

## 2011-09-27 LAB — BASIC METABOLIC PANEL
Anion Gap: 11 (ref 7–16)
BUN: 13 mg/dL (ref 7–18)
Chloride: 108 mmol/L — ABNORMAL HIGH (ref 98–107)
EGFR (African American): >60
EGFR (Non-African Amer.): >60
Osmolality: 287 (ref 275–301)
Potassium: 4.2 mmol/L (ref 3.5–5.1)

## 2011-09-27 LAB — MAGNESIUM: Magnesium: 1.8 mg/dL

## 2011-09-27 LAB — CK TOTAL AND CKMB (NOT AT ARMC): CK, Total: 164 U/L (ref 35–232)

## 2011-10-07 ENCOUNTER — Encounter: Payer: Self-pay | Admitting: *Deleted

## 2011-10-09 ENCOUNTER — Encounter: Payer: Self-pay | Admitting: Cardiovascular Disease

## 2011-10-09 ENCOUNTER — Other Ambulatory Visit: Payer: Self-pay | Admitting: Cardiovascular Disease

## 2011-10-09 ENCOUNTER — Ambulatory Visit (INDEPENDENT_AMBULATORY_CARE_PROVIDER_SITE_OTHER): Payer: Medicare Other | Admitting: Cardiovascular Disease

## 2011-10-09 VITALS — BP 138/82 | HR 51 | Ht 69.0 in | Wt 228.0 lb

## 2011-10-09 DIAGNOSIS — I4891 Unspecified atrial fibrillation: Secondary | ICD-10-CM

## 2011-10-09 DIAGNOSIS — I480 Paroxysmal atrial fibrillation: Secondary | ICD-10-CM | POA: Insufficient documentation

## 2011-10-09 DIAGNOSIS — I25810 Atherosclerosis of coronary artery bypass graft(s) without angina pectoris: Secondary | ICD-10-CM

## 2011-10-09 DIAGNOSIS — I10 Essential (primary) hypertension: Secondary | ICD-10-CM

## 2011-10-09 DIAGNOSIS — R0602 Shortness of breath: Secondary | ICD-10-CM

## 2011-10-09 DIAGNOSIS — E7800 Pure hypercholesterolemia, unspecified: Secondary | ICD-10-CM

## 2011-10-09 MED ORDER — METOPROLOL TARTRATE 25 MG PO TABS: 37.5000 mg | ORAL_TABLET | Freq: Two times a day (BID) | ORAL | Status: DC

## 2011-10-09 NOTE — Progress Notes (Signed)
Patient ID: [name], male    DOB: 1946-04-06, 66 y.o.   MRN: [id number]  HPI Comments: [name] is a 66 year old gentleman with a history of smoking, coronary artery disease, occluded LAD in the mid vessel, 60% left main, 99% distal RCA who was sent to [facility] in October 2004 bypass surgery. He received 3 vessel bypass by Dr.Gerhardt with a LIMA to the LAD, vein graft to the OM1 and vein graft to the PDA, with subsequent cardiac catheter March 2011 for chest discomfort showing occluded vein grafts x2 with patent LIMA. He has collateral vessels from left to right. Ejection fraction 55% mild anterolateral hypokinesis.     chronic leg pain since he was young. He also reports having leg pain prior to his bypass surgery.  He has had worsening shortness of breath over the past year. He is concerned about lungs, possibly his heart.  He was admitted to the hospital on March 8 for malaise, appearing pale, irregular heart rhythm and noted to be in atrial fibrillation. He was given medication for rhythm control and he converted to normal sinus rhythm later that evening.  Echocardiogram showed ejection fraction 35-45%, mildly elevated right ventricular systolic pressures estimated at 30-40 mm mercury. He is currently taking simvastatin 20 mg daily. He was started on pradaxa 150 mg twice a day. Isosorbide and ACE inhibitor were held secondary to low blood pressure. BNP was 282 on that admission . He has been taking metoprolol tartrate 25 mg  QID  EKG shows normal sinus rhythm with rate 51  beats per minute with no significant ST or T-wave changes        Outpatient Encounter Prescriptions as of 10/09/2011  Medication Sig Dispense Refill  . atorvastatin (LIPITOR) 20 MG tablet Take 20 mg by mouth daily.   30 tablet  6  . dabigatran (PRADAXA) 150 MG CAPS Take 150 mg by mouth every 12 (twelve) hours.      . nitroGLYCERIN (NITROSTAT) 0.4 MG SL tablet Place 0.4 mg under the tongue every 5 (five) minutes  as needed.      Marland Kitchen ZETIA 10 MG tablet TAKE ONE TABLET BY MOUTH EVERY DAY  30 each  5  .  metoprolol (LOPRESSOR) 50 MG tablet Take 25 mg by mouth 4 (four) times daily. Take 1/2 tablet by mouth twice a day.      Marland Kitchen DISCONTD: amLODipine (NORVASC) 10 MG tablet TAKE ONE TABLET BY MOUTH EVERY DAY  30 tablet  6  . DISCONTD: aspirin 325 MG EC tablet Take 325 mg by mouth daily.        Marland Kitchen DISCONTD: isosorbide mononitrate (IMDUR) 30 MG 24 hr tablet Take 1 tablet (30 mg total) by mouth daily.  30 tablet  6  . DISCONTD: lisinopril (PRINIVIL,ZESTRIL) 10 MG tablet TAKE ONE TABLET BY MOUTH EVERY DAY  30 tablet  5     Review of Systems  Constitutional: Negative.   HENT: Negative.   Eyes: Negative.   Respiratory: Positive for shortness of breath.   Cardiovascular: Negative.   Gastrointestinal: Negative.   Musculoskeletal: Positive for myalgias, joint swelling, arthralgias and gait problem.  Skin: Negative.   Neurological: Negative.   Hematological: Negative.   Psychiatric/Behavioral: Negative.   All other systems reviewed and are negative.   BP 138/82  Pulse 51  Ht 5\' 9"  (1.753 m)  Wt 228 lb (103.42 kg)  BMI 33.67 kg/m2  Physical Exam  Nursing note and vitals reviewed. Constitutional: He is oriented to person,  place, and time. He appears well-developed and well-nourished.       Difficulty standing and ambulating secondary to leg pain.  HENT:  Head: Normocephalic.  Nose: Nose normal.  Mouth/Throat: Oropharynx is clear and moist.  Eyes: Conjunctivae are normal. Pupils are equal, round, and reactive to light.  Neck: Normal range of motion. Neck supple. No JVD present.  Cardiovascular: Normal rate, regular rhythm, S1 normal, S2 normal, normal heart sounds and intact distal pulses.  Exam reveals no gallop and no friction rub.   No murmur heard. Pulmonary/Chest: Effort normal. No respiratory distress. He has decreased breath sounds. He has no wheezes. He has no rales. He exhibits no tenderness.    Abdominal: Soft. Bowel sounds are normal. He exhibits no distension. There is no tenderness.  Musculoskeletal: Normal range of motion. He exhibits no edema and no tenderness.  Lymphadenopathy:    He has no cervical adenopathy.  Neurological: He is alert and oriented to person, place, and time. Coordination normal.  Skin: Skin is warm and dry. No rash noted. No erythema.  Psychiatric: He has a normal mood and affect. His behavior is normal. Judgment and thought content normal.           Assessment and Plan

## 2011-10-09 NOTE — Assessment & Plan Note (Signed)
Adequate blood pressure on today's visit. We'll hold off on adding ACE inhibitor or long-acting nitrates for now.

## 2011-10-09 NOTE — Assessment & Plan Note (Signed)
Cholesterol is at goal on the current lipid regimen. No changes to the medications were made.  

## 2011-10-09 NOTE — Assessment & Plan Note (Addendum)
Etiology if his shortness of breath could be multifactorial. Concerning for long smoking history and COPD. He is not on inhalers. He does have history of sleep disruption, symptoms concerning for sleep apnea.   Him also concerned about underlying ischemia. We'll order a stress test given worsening symptoms. We have given him the contact number for pulmonary in [place].   He did have mildly elevated right ventricular systolic pressures on echocardiogram in the hospital. Normal LV function. If symptoms get worse, we could try Lasix when necessary.

## 2011-10-09 NOTE — Assessment & Plan Note (Signed)
Stress test pending to rule out ischemia

## 2011-10-09 NOTE — Patient Instructions (Addendum)
You are doing well. Please hold the pradaxa when you finish your prescription. If you go back into atrial fibrillation, take pradaxa (samples) and call the office  We will schedule a stress test at [facility]. No coffee the day before and day of the test Hold the metoprolol the night before and morning of the test  Please call us if you have new issues that need to be addressed before your next appt.  Your physician wants you to follow-up in: 6 months.  You will receive a reminder letter in the mail two months in advance. If you don't receive a letter, please call our office to schedule the follow-up appointment.

## 2011-10-09 NOTE — Assessment & Plan Note (Signed)
Recent episode of atrial fibrillation noted 09/26/2011 resolved that night.  Currently maintaining normal sinus rhythm. We'll change his metoprolol tartrate to 37.5 mg twice a day. He finds it cumbersome to take medication 4 times a day as prescribed in the hospital.  We have suggested he take his pradaxa until his prescription for this month ends, and then stop the medication. We have given him some samples to take when necessary for irregular rhythm concerning for atrial fibrillation and we have asked him to call us at that time.

## 2011-10-22 ENCOUNTER — Ambulatory Visit: Payer: Self-pay | Admitting: Cardiovascular Disease

## 2011-10-22 DIAGNOSIS — R079 Chest pain, unspecified: Secondary | ICD-10-CM

## 2011-10-24 ENCOUNTER — Telehealth: Payer: Self-pay | Admitting: *Deleted

## 2011-10-24 NOTE — Telephone Encounter (Signed)
pt notified of stress test results negative per Dr. Mariah Milling, stress test was done @  [facility], pt advised to call if still having sxms/problems, pt gave verbal understanding . Danielle Rankin

## 2011-11-17 ENCOUNTER — Encounter: Payer: Self-pay | Admitting: Cardiology

## 2011-11-24 ENCOUNTER — Encounter: Payer: Self-pay | Admitting: Pulmonary Disease

## 2011-11-24 ENCOUNTER — Ambulatory Visit (INDEPENDENT_AMBULATORY_CARE_PROVIDER_SITE_OTHER): Payer: Medicare Other | Admitting: Pulmonary Disease

## 2011-11-24 VITALS — BP 118/72 | HR 65 | Temp 98.0°F | Ht 69.0 in | Wt 226.8 lb

## 2011-11-24 DIAGNOSIS — R0600 Dyspnea, unspecified: Secondary | ICD-10-CM

## 2011-11-24 DIAGNOSIS — J069 Acute upper respiratory infection, unspecified: Secondary | ICD-10-CM | POA: Insufficient documentation

## 2011-11-24 DIAGNOSIS — R0609 Other forms of dyspnea: Secondary | ICD-10-CM

## 2011-11-24 DIAGNOSIS — R0989 Other specified symptoms and signs involving the circulatory and respiratory systems: Secondary | ICD-10-CM

## 2011-11-24 DIAGNOSIS — R0602 Shortness of breath: Secondary | ICD-10-CM

## 2011-11-24 MED ORDER — DOXYCYCLINE HYCLATE 100 MG PO TABS: 100.0000 mg | ORAL_TABLET | Freq: Two times a day (BID) | ORAL | Status: AC

## 2011-11-24 NOTE — Progress Notes (Signed)
Subjective:    Patient ID: [name], male    DOB: 1946/05/12, 66 y.o.   MRN: [id number]  HPI [name] has coronary artery disease and atrial fibrillation and was referred to [place] by Dr. Mariah Milling for evaluation of shortness of breath.  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  Otherwise he never had respiratory symptoms until a few years before his CABG in 2010. Since then his dyspnea on exertion has progressed to the point that he becomes short of breath if carrying objects (ie. Groceries) only 30 feet or so.  He can walk on level ground several hundred yards.  He feels that this dyspnea has been progressing.  He does not get chest pain with the dyspnea and he does not have leg swelling.  He noted on 5/3 the abrupt onset of chest congestion, cough, and dyspnea.  He has been producing thick yellow phlegm.  He does not have a chronic cough at baseline.  Recently Dr. Mariah Milling has noted that his A-fib is rare at best and a recent stress test showed no evidence of ischemia and a normal LVEF.   Past Medical History  Diagnosis Date  . Coronary artery disease   . Pure hypercholesterolemia   . Hypertension   . Atrial fibrillation     possibly resolved   . History of arthritis      Family History  Problem Relation Age of Onset  . Coronary artery disease      family history   Mother had trouble breathing, uncle trouble breathing, unclear etiology  History   Social History  . Marital Status: Married    Spouse Name: N/A    Number of Children: N/A  . Years of Education: N/A   Occupational History  . retired     Estate manager/land agent   Social History Main Topics  . Smoking status: Former Smoker -- 1.0 packs/day    Types: Cigarettes    Quit date: 04/22/2009  . Smokeless tobacco: Not on file  . Alcohol Use: 0.5 oz/week    1 drink(s) per week  . Drug Use: No  . Sexually Active: Not on file    Other Topics Concern  . Not on file   Social History Narrative  . No narrative on file     No Known Allergies   Outpatient Prescriptions Prior to Visit  Medication Sig Dispense Refill  . atorvastatin (LIPITOR) 10 MG tablet TAKE ONE TABLET BY MOUTH EVERY DAY  30 tablet  6  . atorvastatin (LIPITOR) 20 MG tablet Take 20 mg by mouth daily.   30 tablet  6  . dabigatran (PRADAXA) 150 MG CAPS Take 150 mg by mouth every 12 (twelve) hours.      . metoprolol tartrate (LOPRESSOR) 25 MG tablet Take 1.5 tablets (37.5 mg total) by mouth 2 (two) times daily.  90 tablet  6  . nitroGLYCERIN (NITROSTAT) 0.4 MG SL tablet Place 0.4 mg under the tongue every 5 (five) minutes as needed.      Marland Kitchen ZETIA 10 MG tablet TAKE ONE TABLET BY MOUTH EVERY DAY  30 each  5    Review of Systems  Constitutional: Positive for fever, chills and fatigue. Negative for diaphoresis, appetite change and unexpected weight change.  HENT: Positive for hearing loss, congestion, rhinorrhea and postnasal drip. Negative for ear pain, nosebleeds, sore throat, sneezing, tinnitus and ear discharge.  Eyes: Negative for pain and redness.  Respiratory: Positive for cough, chest tightness and shortness of breath. Negative for wheezing.   Cardiovascular: Negative for chest pain, palpitations and leg swelling.  Gastrointestinal: Negative for nausea, vomiting, abdominal pain and diarrhea.  Genitourinary: Negative for dysuria and flank pain.  Musculoskeletal: Positive for myalgias, back pain and arthralgias. Negative for joint swelling.  Skin: Negative for color change and rash.  Neurological: Negative for dizziness, speech difficulty, light-headedness and headaches.       Objective:   Physical Exam  Filed Vitals:   11/24/11 1548  BP: 118/72  Pulse: 65  Temp: 98 F (36.7 C)  TempSrc: Oral  Height: 5\' 9"  (1.753 m)  Weight: 226 lb 12.8 oz (102.876 kg)  SpO2: 96%   Gen: no acute distress HEENT: NCAT, PERRL, EOMi, OP clear, neck  supple without masses PULM: Insp crackles in bases bilatearlly, L > R CV: RRR, no mgr, no JVD AB: BS+, soft, nontender, no hsm Ext: warm, no edema, no clubbing, no cyanosis Derm: no rash or skin breakdown Neuro: A&Ox4, CN II-XII intact, strength 5/5 in all 4 extremities      Assessment & Plan:   Shortness of breath [name] has had shortness of breath for years but it seems that it has been limiting his functional status for the last two years. His recent chemical stress test showed no evidence of ischaemia and showed a normal EF. I am concerned about COPD primarily as a cause of his shortness of breath based on his years of smoking.  It is also reasonable to consider ILD based on his crackles on exam but there is no objective evidence yet to assess for this.  Deconditioning is also a concern.  His current respiratory infection (see below) makes it difficult for [place] to perform the objective tests I would like to best assess him at this point (full PFT's).  He did not show evidence of exertional hypoxemia with walking 500 feet in clinic today.  Plan: -treat his current respiratory infection -CXR tomorrow -Full PFT's after the infection is over at [facility] in 4-6 weeks for f/u  URI (upper respiratory infection) He has had three days of fever, productive cough, chest congestion and worsening shortness of breath.  This is likely a viral URI however based on his exam findings of crackles on the left more than the right I am compelled to treat him for pneumonia/purulent bronchitis with doxycyline.    Plan: -CXR in AM -doxycycline 100mg  po bid for 7 days -Guafenesin, Delsym as needed for cough -increase fluid intake -call us if no improvement    Updated Medication List Outpatient Encounter Prescriptions as of 11/24/2011  Medication Sig Dispense Refill  . aspirin 325 MG tablet Take 325 mg by mouth daily.      Marland Kitchen atorvastatin (LIPITOR) 10 MG tablet TAKE ONE TABLET BY MOUTH EVERY DAY  30  tablet  6  . metoprolol tartrate (LOPRESSOR) 25 MG tablet Take 1.5 tablets (37.5 mg total) by mouth 2 (two) times daily.  90 tablet  6  . Multiple Vitamin (MULTI VITAMIN MENS PO) Take 1 tablet by mouth daily.      . nitroGLYCERIN (NITROSTAT) 0.4 MG SL tablet Place 0.4 mg under the tongue every 5 (five) minutes as needed.      . Omega-3 Fatty Acids (FISH OIL) 1000 MG CAPS Take 1 capsule by mouth daily.      Marland Kitchen ZETIA 10 MG tablet TAKE ONE TABLET BY MOUTH EVERY  DAY  30 each  5  . doxycycline (VIBRA-TABS) 100 MG tablet Take 1 tablet (100 mg total) by mouth 2 (two) times daily.  14 tablet  0  . DISCONTD: atorvastatin (LIPITOR) 20 MG tablet Take 20 mg by mouth daily.   30 tablet  6  . DISCONTD: dabigatran (PRADAXA) 150 MG CAPS Take 150 mg by mouth every 12 (twelve) hours.

## 2011-11-24 NOTE — Assessment & Plan Note (Signed)
[name] has had shortness of breath for years but it seems that it has been limiting his functional status for the last two years. His recent chemical stress test showed no evidence of ischaemia and showed a normal EF. I am concerned about COPD primarily as a cause of his shortness of breath based on his years of smoking.  It is also reasonable to consider ILD based on his crackles on exam but there is no objective evidence yet to assess for this.  Deconditioning is also a concern.  His current respiratory infection (see below) makes it difficult for [place] to perform the objective tests I would like to best assess him at this point (full PFT's).  He did not show evidence of exertional hypoxemia with walking 500 feet in clinic today.  Plan: -treat his current respiratory infection -CXR tomorrow -Full PFT's after the infection is over at [facility] in 4-6 weeks for f/u

## 2011-11-24 NOTE — Assessment & Plan Note (Signed)
He has had three days of fever, productive cough, chest congestion and worsening shortness of breath.  This is likely a viral URI however based on his exam findings of crackles on the left more than the right I am compelled to treat him for pneumonia/purulent bronchitis with doxycyline.    Plan: -CXR in AM -doxycycline 100mg  po bid for 7 days -Guafenesin, Delsym as needed for cough -increase fluid intake -call us if no improvement

## 2011-11-24 NOTE — Patient Instructions (Signed)
Take the doxycycline tablets as prescribed for one week for your cough. We will send you for a Chest X-ray at the [facility] on 11/25/2011 in the morning. We will call you with the results of the chest x-ray. Use mucinex and delsym twice per day as needed for cough and chest congestion. Drink plenty of fluids in the next few days while you are sick.   We will send you for full Pulmonary Function Tests at [facility] in two weeks after you have recovered from your cough. We will see you back in 4-6 weeks, overbook OK.

## 2011-11-25 ENCOUNTER — Ambulatory Visit (INDEPENDENT_AMBULATORY_CARE_PROVIDER_SITE_OTHER)
Admission: RE | Admit: 2011-11-25 | Discharge: 2011-11-25 | Disposition: A | Discharge status: Home / Self Care | Payer: Medicare Other | Source: Ambulatory Visit | Attending: Pulmonary Disease | Admitting: Pulmonary Disease

## 2011-11-25 DIAGNOSIS — R0609 Other forms of dyspnea: Secondary | ICD-10-CM

## 2011-11-25 DIAGNOSIS — R0600 Dyspnea, unspecified: Secondary | ICD-10-CM

## 2011-11-25 DIAGNOSIS — R0989 Other specified symptoms and signs involving the circulatory and respiratory systems: Secondary | ICD-10-CM

## 2011-11-25 IMAGING — CR DG CHEST 2V
2 series · 2 of 2 positions shown · non-contrast
Comparison: [DATE]

CLINICAL DATA: Cough, dyspnea

CHEST - 2 VIEW

[view not recorded (1 of 2)]
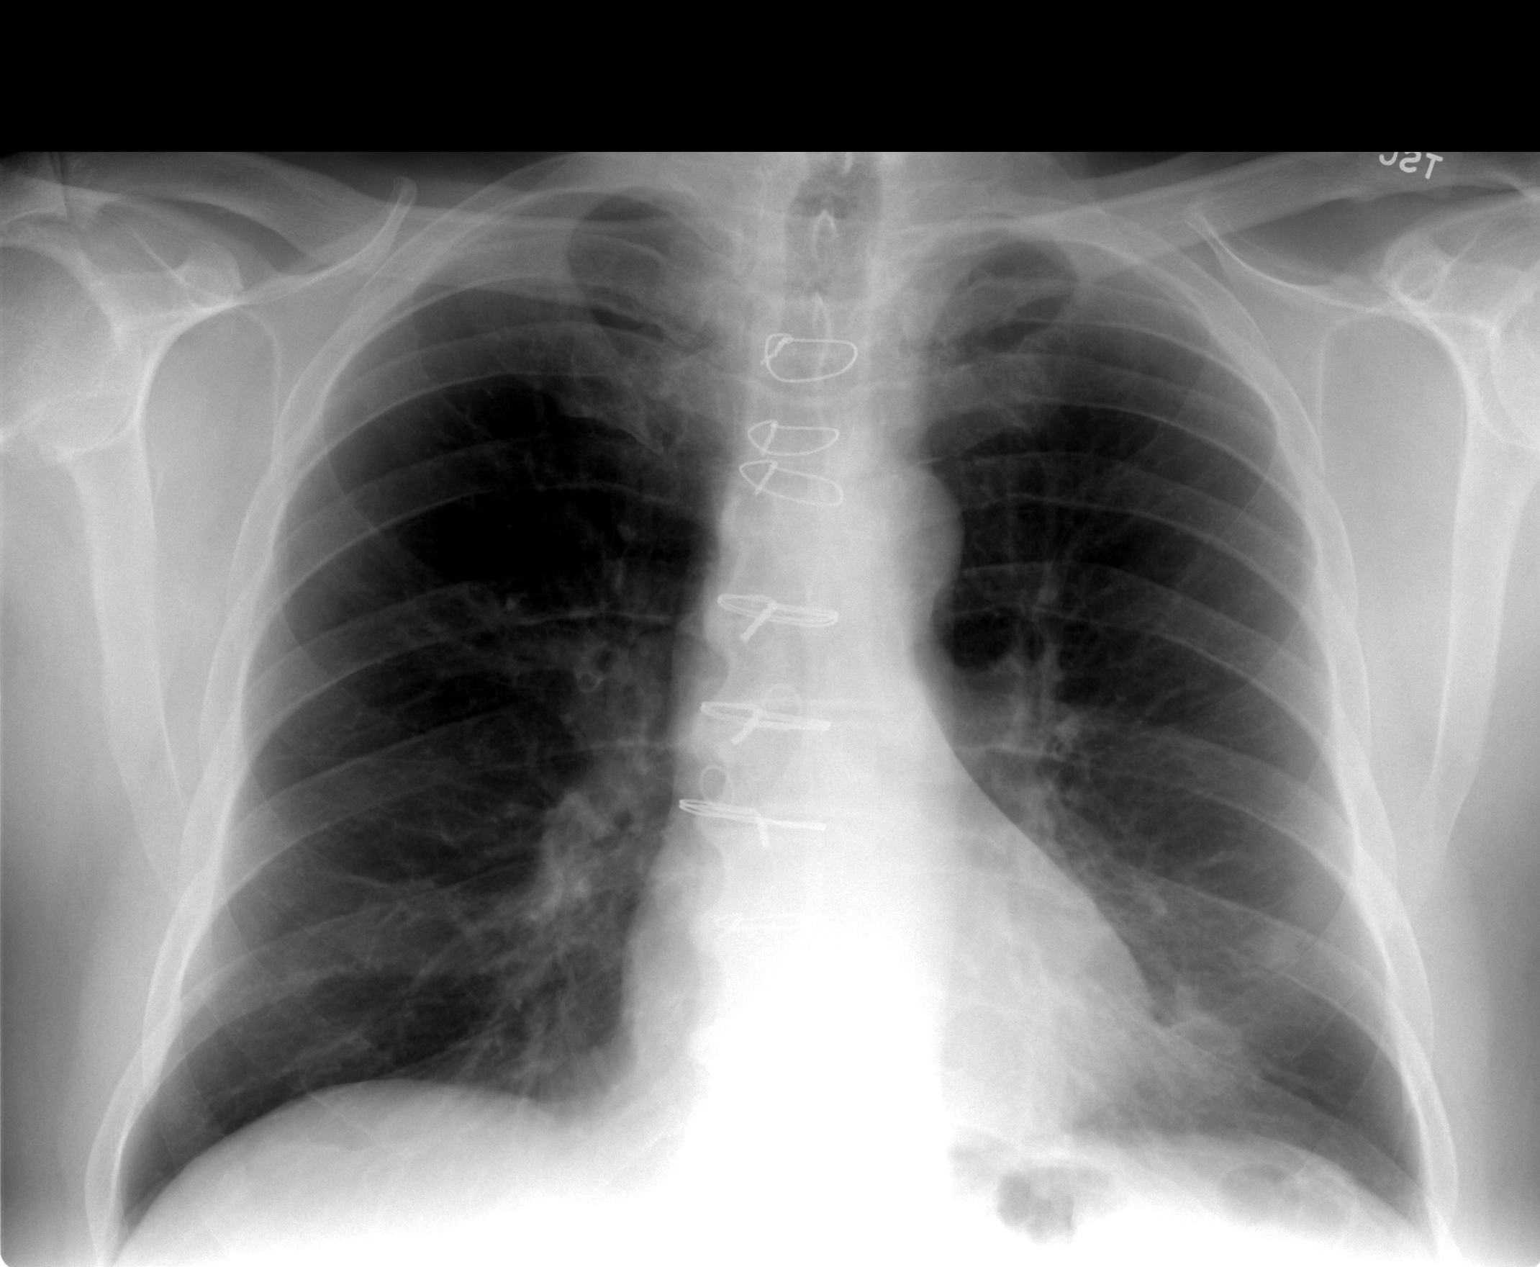

[view not recorded (2 of 2)]
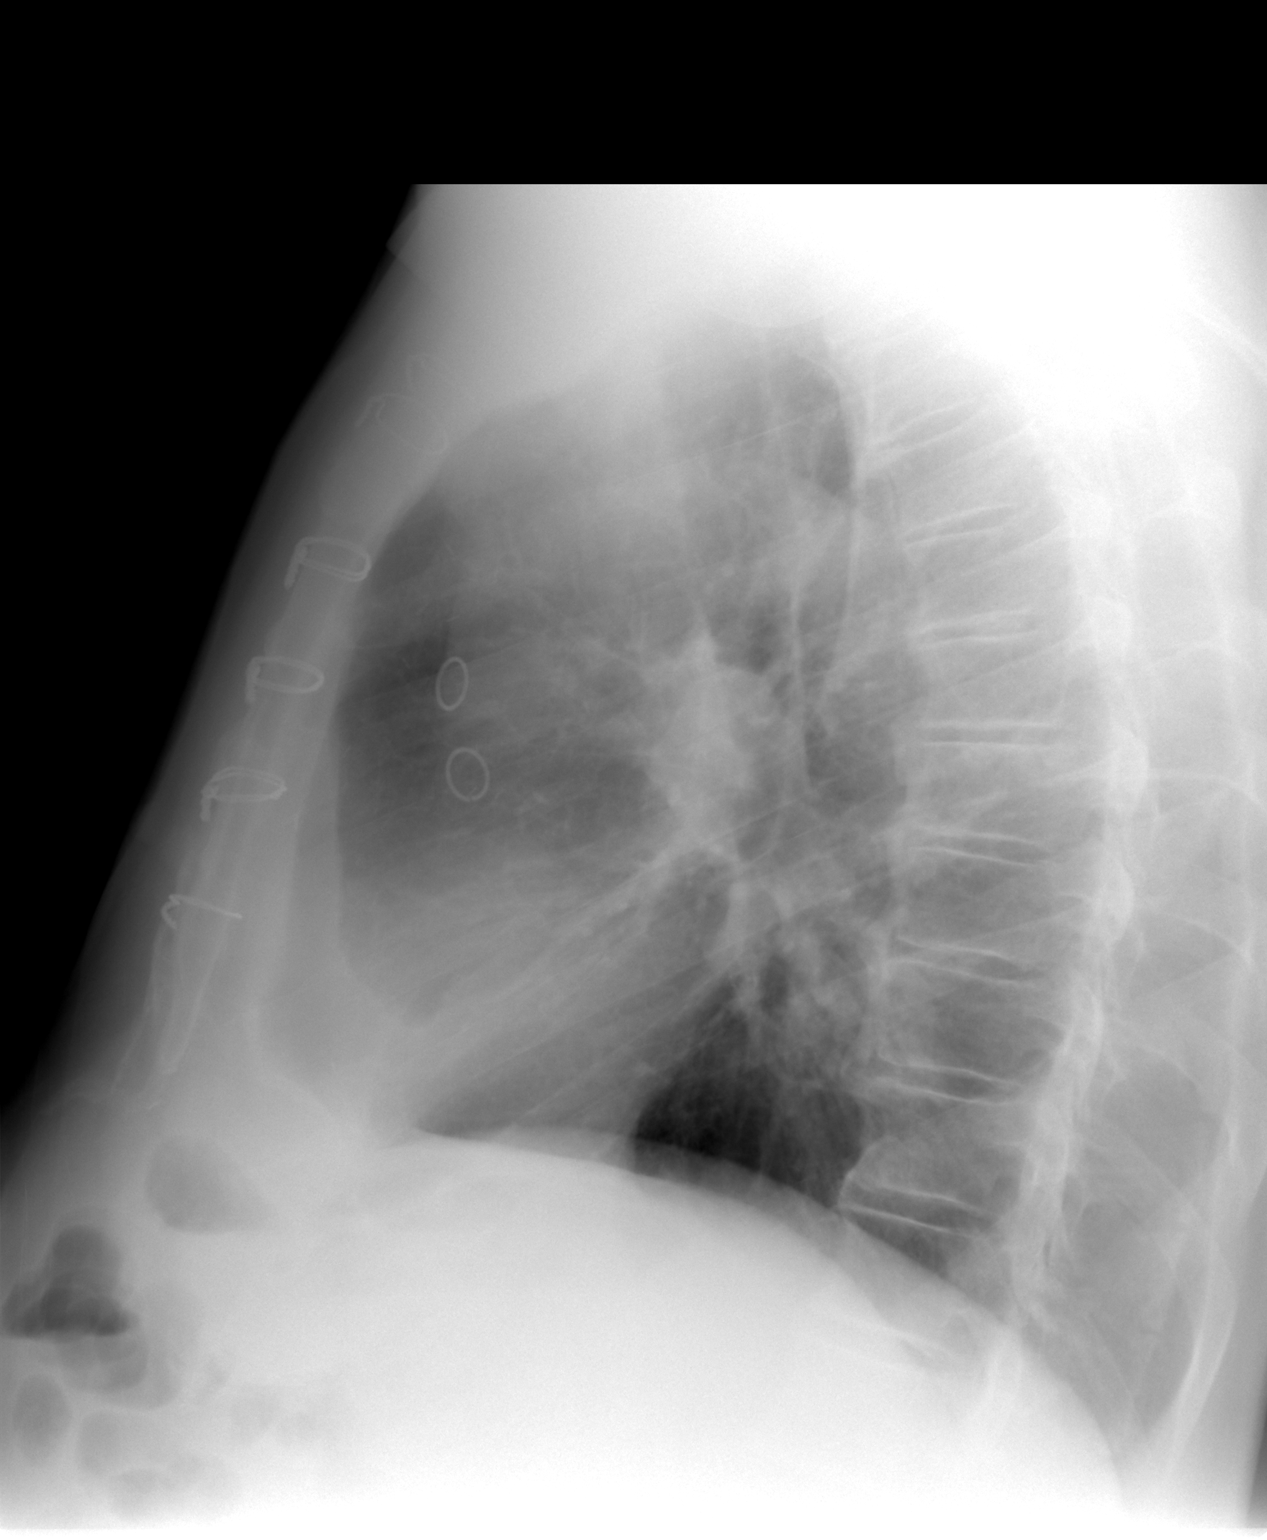

[2 of 2 positions shown; findings below may reference images not displayed]

FINDINGS: Cardiomediastinal silhouette is stable.  Status post CABG
again noted.  Stable left basilar atelectasis or scarring.  There
is a nodular density left base.  This may represent nipple shadow.
Repeat frontal view with nipple markers is recommended for
confirmation. No pulmonary edema or segmental infiltrate. Stable
mild degenerative changes thoracic spine.
IMPRESSION: Stable left basilar atelectasis or scarring.  There is a nodular
density left base.  This may represent nipple shadow.  Repeat
frontal view with nipple markers is recommended for confirmation.]
No pulmonary edema or segmental infiltrate.

## 2011-11-26 ENCOUNTER — Telehealth: Payer: Self-pay | Admitting: Pulmonary Disease

## 2011-11-26 DIAGNOSIS — R0602 Shortness of breath: Secondary | ICD-10-CM

## 2011-11-26 NOTE — Telephone Encounter (Signed)
Please let [name] know that his CXR was normal, except for a small nodule in his left lung. The radiologist though that this was his nipple and that we should repeat the test with nipple markers. We'll need to order this test for the same indication. I'm OK with him having it at [facility] if they can't do it at [facility].  [name]   I spoke with the pt and notified of results/recs per BQ. He verbalized understanding and states will discuss this with his spouse and call back to let me know where he prefers to have the cxr done.

## 2011-11-26 NOTE — Telephone Encounter (Signed)
Spoke with pt and scheduled cxr with nipple markers at [facility] for tomorrow.

## 2011-11-27 ENCOUNTER — Ambulatory Visit (INDEPENDENT_AMBULATORY_CARE_PROVIDER_SITE_OTHER)
Admission: RE | Admit: 2011-11-27 | Discharge: 2011-11-27 | Disposition: A | Discharge status: Home / Self Care | Payer: Medicare Other | Source: Ambulatory Visit | Attending: Pulmonary Disease | Admitting: Pulmonary Disease

## 2011-11-27 DIAGNOSIS — R0602 Shortness of breath: Secondary | ICD-10-CM

## 2011-11-27 IMAGING — CR DG CHEST 2V
2 series · 2 of 2 positions shown · non-contrast
Comparison: [DATE]

CLINICAL DATA: Shortness of breath.  Repeat with nipple markers.

CHEST - 2 VIEW

[view not recorded (1 of 2)]
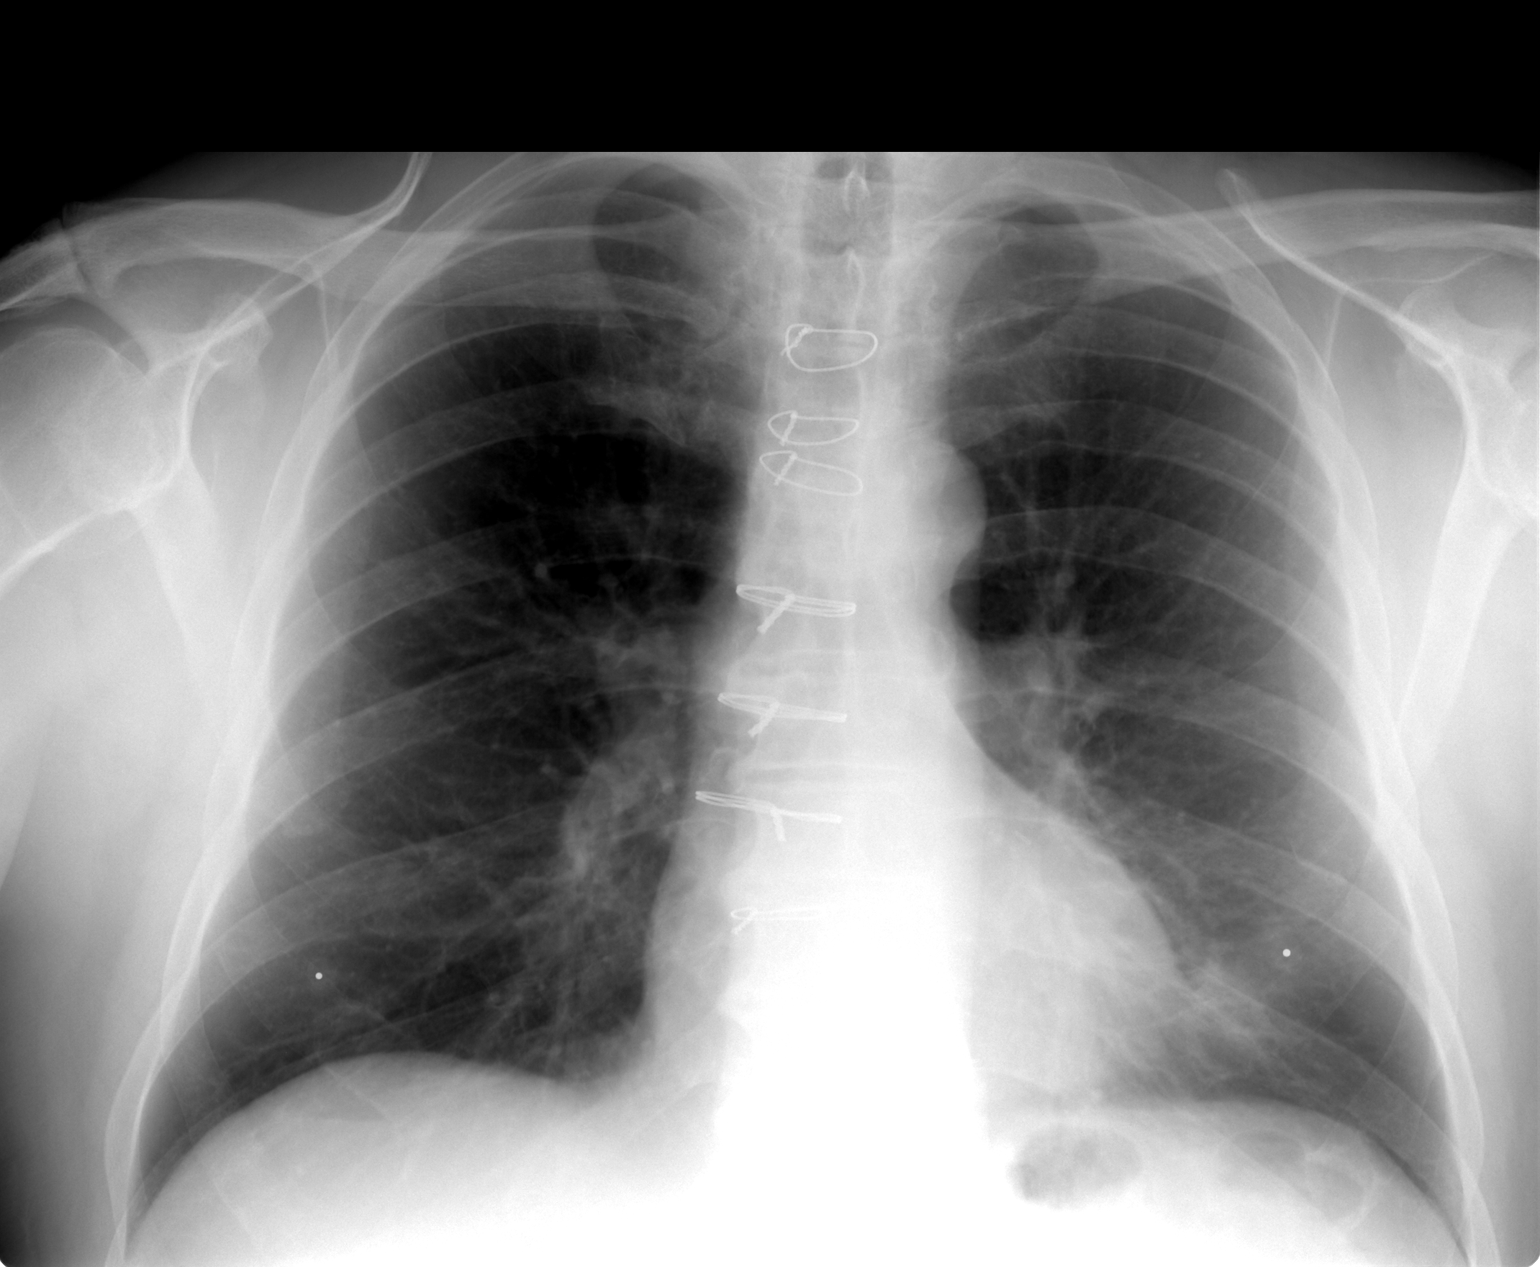

[view not recorded (2 of 2)]
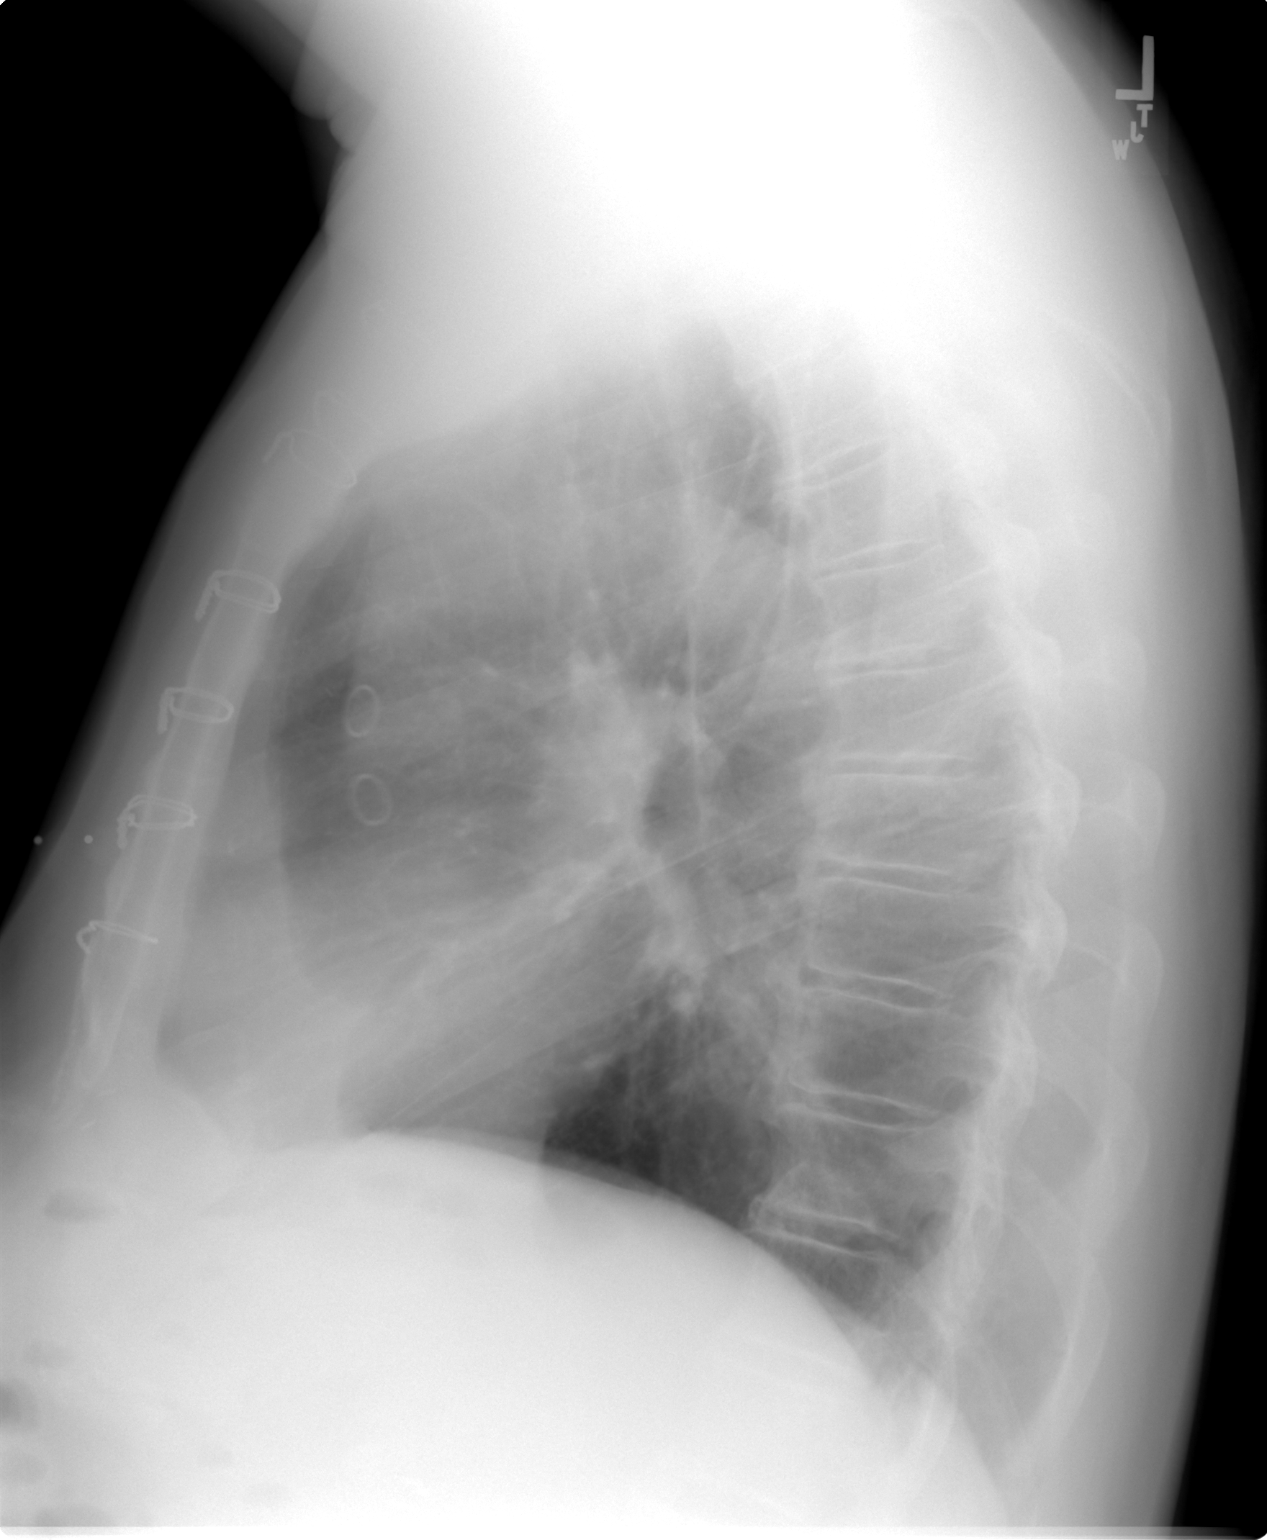

[2 of 2 positions shown; findings below may reference images not displayed]

FINDINGS: The nodular densities over the lower lung field represent
nipple shadows, corresponding to the nipple markers.  Heart is
normal size.  Lungs are clear.  Prior median sternotomy.  No
effusions.
IMPRESSION: The nodular densities over the lower lung field represent nipple
shadows.  No acute findings.

## 2011-12-02 ENCOUNTER — Telehealth: Payer: Self-pay | Admitting: *Deleted

## 2011-12-02 NOTE — Telephone Encounter (Signed)
Message copied by Christen Butter on Tue Dec 02, 2011  1:12 PM ------      Message from: Veto Kemps B      Created: Mon Dec 01, 2011  9:45 PM       L,            Can you let him know that his repeat CXR was normal?            Thanks,      B

## 2011-12-02 NOTE — Telephone Encounter (Signed)
Spoke with pt and notified of results per Dr.McQuaid. Pt verbalized understanding and denied any questions. 

## 2011-12-11 ENCOUNTER — Ambulatory Visit: Payer: Self-pay | Admitting: Pulmonary Disease

## 2011-12-11 LAB — PULMONARY FUNCTION TEST

## 2011-12-12 ENCOUNTER — Encounter: Payer: Self-pay | Admitting: Pulmonary Disease

## 2011-12-29 ENCOUNTER — Other Ambulatory Visit: Payer: Self-pay | Admitting: Cardiovascular Disease

## 2011-12-30 ENCOUNTER — Other Ambulatory Visit: Payer: Self-pay | Admitting: Cardiovascular Disease

## 2011-12-30 ENCOUNTER — Telehealth: Payer: Self-pay | Admitting: *Deleted

## 2011-12-30 ENCOUNTER — Other Ambulatory Visit: Payer: Self-pay | Admitting: *Deleted

## 2011-12-30 MED ORDER — ATORVASTATIN CALCIUM 10 MG PO TABS: 10.0000 mg | ORAL_TABLET | Freq: Every day | ORAL | Status: DC

## 2011-12-30 MED ORDER — METOPROLOL TARTRATE 25 MG PO TABS: 37.5000 mg | ORAL_TABLET | Freq: Two times a day (BID) | ORAL | Status: DC

## 2011-12-30 MED ORDER — EZETIMIBE 10 MG PO TABS: 10.0000 mg | ORAL_TABLET | Freq: Every day | ORAL | Status: DC

## 2011-12-30 NOTE — Telephone Encounter (Signed)
Pt wants to speak to a nurse concerning the Zetia also

## 2011-12-30 NOTE — Telephone Encounter (Signed)
Refilled Lipitor and lopressor.

## 2011-12-30 NOTE — Telephone Encounter (Signed)
Refilled Zetia.

## 2011-12-30 NOTE — Telephone Encounter (Signed)
Talked to pt concerning Zetia. Pt is requesting to discontinue Zetia because it is to expensive. Pt was wanting to know if Dr. Mariah Milling would take him off and allow him to double up on Lipitor or take some other medication that is not as expensive.

## 2012-01-01 ENCOUNTER — Encounter: Payer: Self-pay | Admitting: Pulmonary Disease

## 2012-01-01 ENCOUNTER — Ambulatory Visit (INDEPENDENT_AMBULATORY_CARE_PROVIDER_SITE_OTHER): Payer: Medicare Other | Admitting: Pulmonary Disease

## 2012-01-01 VITALS — BP 128/78 | HR 78 | Temp 97.9°F | Ht 69.0 in | Wt 229.0 lb

## 2012-01-01 DIAGNOSIS — J449 Chronic obstructive pulmonary disease, unspecified: Secondary | ICD-10-CM | POA: Insufficient documentation

## 2012-01-01 DIAGNOSIS — J441 Chronic obstructive pulmonary disease with (acute) exacerbation: Secondary | ICD-10-CM | POA: Insufficient documentation

## 2012-01-01 MED ORDER — TIOTROPIUM BROMIDE MONOHYDRATE 18 MCG IN CAPS: 18.0000 ug | ORAL_CAPSULE | Freq: Every day | RESPIRATORY_TRACT | Status: DC

## 2012-01-01 NOTE — Assessment & Plan Note (Signed)
COPD: GOLD Grade B Combined recommendations from the [employer]	[place], [employer], Designer, television/film set, European Respiratory Society (Qaseem A et al, Ann Intern Med. 2011;155(3):179) recommends tobacco cessation, pulmonary rehab (for symptomatic patients with an FEV1 < 50% predicted), supplemental oxygen (for patients with SaO2 <88% or paO2 <55), and appropriate bronchodilator therapy.  In regards to long acting bronchodilators, they recommend monotherapy (FEV1 60-80% with symptoms weak evidence, FEV1 with symptoms <60% strong evidence), or combination therapy (FEV1 <60% with symptoms, strong recommendation, moderate evidence).  One should also provide patients with annual immunizations and consider therapy for prevention of COPD exacerbations (ie. roflumilast or azithromycin) when appopriate.  -O2 therapy: not indicated -Immunizations: UTD -Tobacco use: previous smoker -Exercise: pulmonary rehab referral placed -Bronchodilator therapy: start spiriva -Exacerbation prevention: spiriva, pulm rehab

## 2012-01-01 NOTE — Patient Instructions (Signed)
We will send you to pulmonary rehab at [facility] using spiriva every day for your COPD.  This medication is to be taken every day no matter how you feel. It is not an as needed drug. We will see you back in 4 months or sooner if needed.

## 2012-01-01 NOTE — Progress Notes (Signed)
Subjective:    Patient ID: [name], male    DOB: [birth year], 66 y.o.   MRN: [id number]  Synopsis: [name] first came to the [facility] PCCM clinic in May 2013 for evaluation of shortness of breath.  He has known CAD and a 40 pack year smoking history.  On the first visit he had bronchitis so no work up was performed.   HPI 01/01/12 ROV COPD -- [name] states that he still has trouble breathing if he walks up a hill and unloading groceries.  However his cough has improved and he does not have chest pain, fever, or chills.  He does not have leg swelling.  He underwent pulmonary function testing last week and he is here for further evaluation.   Review of Systems  Constitutional: Negative for fever, chills and fatigue.  HENT: Negative for congestion, sneezing and postnasal drip.   Respiratory: Positive for shortness of breath and wheezing. Negative for cough.   Cardiovascular: Negative for chest pain and leg swelling.       Objective:   Physical Exam  Filed Vitals:   01/01/12 1410  BP: 128/78  Pulse: 78  Temp: 97.9 F (36.6 C)  TempSrc: Oral  Height: 5\' 9"  (1.753 m)  Weight: 229 lb (103.874 kg)  SpO2: 97%   Gen: well appearing, no acute distress HEENT: NCAT, PERRL, EOMi, OP clear, PULM: Few insp crackles in bases, scattered wheeze CV: RRR, no murmur, no JVD AB: BS+, soft, nontender, no hsm Ext: warm, no edema, no clubbing, no cyanosis  12/2011 PFT [facility] Ratio 59%, FEV1 2.09 L (70% pred) TLC 6.15 L (97% pred) DLCO 79% pred 11/2011 CXR >> no acute lung disease     Assessment & Plan:   COPD (chronic obstructive pulmonary disease) COPD: GOLD Grade B Combined recommendations from the [employer], [employer], Designer, television/film set, European Respiratory Society (Qaseem A et al, Ann Intern Med. 2011;155(3):179) recommends tobacco cessation, pulmonary rehab (for symptomatic patients with an FEV1 < 50% predicted), supplemental oxygen  (for patients with SaO2 <88% or paO2 <55), and appropriate bronchodilator therapy.  In regards to long acting bronchodilators, they recommend monotherapy (FEV1 60-80% with symptoms weak evidence, FEV1 with symptoms <60% strong evidence), or combination therapy (FEV1 <60% with symptoms, strong recommendation, moderate evidence).  One should also provide patients with annual immunizations and consider therapy for prevention of COPD exacerbations (ie. roflumilast or azithromycin) when appopriate.  -O2 therapy: not indicated -Immunizations: UTD -Tobacco use: previous smoker -Exercise: pulmonary rehab referral placed -Bronchodilator therapy: start spiriva -Exacerbation prevention: spiriva, pulm rehab    Updated Medication List Outpatient Encounter Prescriptions as of 01/01/2012  Medication Sig Dispense Refill  . aspirin 325 MG tablet Take 325 mg by mouth daily.      Marland Kitchen atorvastatin (LIPITOR) 10 MG tablet Take 1 tablet (10 mg total) by mouth daily.  30 tablet  5  . ezetimibe (ZETIA) 10 MG tablet Take 1 tablet (10 mg total) by mouth daily.  30 tablet  5  . metoprolol tartrate (LOPRESSOR) 25 MG tablet Take 1.5 tablets (37.5 mg total) by mouth 2 (two) times daily.  90 tablet  5  . Multiple Vitamin (MULTI VITAMIN MENS PO) Take 1 tablet by mouth daily.      . nitroGLYCERIN (NITROSTAT) 0.4 MG SL tablet Place 0.4 mg under the tongue every 5 (five) minutes as needed.      . Omega-3 Fatty Acids (FISH OIL) 1000 MG CAPS Take 1 capsule  by mouth daily.      Marland Kitchen tiotropium (SPIRIVA HANDIHALER) 18 MCG inhalation capsule Place 1 capsule (18 mcg total) into inhaler and inhale daily.  30 capsule  2

## 2012-01-02 ENCOUNTER — Encounter: Payer: Self-pay | Admitting: Pulmonary Disease

## 2012-01-04 NOTE — Telephone Encounter (Signed)
If he is still on zetia now, would check lipids before he stops. If he has already stopped zetia, would increase liptor to 20 mg daily. Check lipids in three months

## 2012-01-05 ENCOUNTER — Telehealth: Payer: Self-pay | Admitting: *Deleted

## 2012-01-05 NOTE — Telephone Encounter (Signed)
See below

## 2012-01-05 NOTE — Telephone Encounter (Signed)
Called pt to confirm if he is still taking Zetia, he mentioned that he has not quit taking Zetia. Pt will be coming in tomorrow to get his lipids checked per Dr. Mariah Milling before d/c Zetia to see if he can take a different medication because Zetia is to expensive.

## 2012-01-06 ENCOUNTER — Ambulatory Visit (INDEPENDENT_AMBULATORY_CARE_PROVIDER_SITE_OTHER): Payer: Medicare Other

## 2012-01-06 DIAGNOSIS — E785 Hyperlipidemia, unspecified: Secondary | ICD-10-CM

## 2012-01-06 DIAGNOSIS — I25810 Atherosclerosis of coronary artery bypass graft(s) without angina pectoris: Secondary | ICD-10-CM

## 2012-01-07 ENCOUNTER — Encounter: Payer: Self-pay | Admitting: Internal Medicine

## 2012-01-07 ENCOUNTER — Ambulatory Visit (INDEPENDENT_AMBULATORY_CARE_PROVIDER_SITE_OTHER): Payer: Medicare Other | Admitting: Internal Medicine

## 2012-01-07 VITALS — BP 124/70 | HR 74 | Ht 69.0 in | Wt 227.5 lb

## 2012-01-07 DIAGNOSIS — I10 Essential (primary) hypertension: Secondary | ICD-10-CM

## 2012-01-07 DIAGNOSIS — I25810 Atherosclerosis of coronary artery bypass graft(s) without angina pectoris: Secondary | ICD-10-CM

## 2012-01-07 DIAGNOSIS — E7800 Pure hypercholesterolemia, unspecified: Secondary | ICD-10-CM

## 2012-01-07 DIAGNOSIS — Z1211 Encounter for screening for malignant neoplasm of colon: Secondary | ICD-10-CM

## 2012-01-07 LAB — HEPATIC FUNCTION PANEL
Albumin: 4.5 g/dL (ref 3.6–4.8)
Alkaline Phosphatase: 79 IU/L (ref 25–160)
Bilirubin, Direct: 0.22 mg/dL (ref 0.00–0.40)
Total Bilirubin: 0.9 mg/dL (ref 0.0–1.2)
Total Protein: 7.1 g/dL (ref 6.0–8.5)

## 2012-01-07 LAB — LIPID PANEL
Chol/HDL Ratio: 4.1 ratio units (ref 0.0–5.0)
HDL: 35 mg/dL — ABNORMAL LOW (ref >39)

## 2012-01-07 NOTE — Assessment & Plan Note (Signed)
Currently asymptomatic. On statin and aspirin. LDL is near goal. Will continue to monitor.

## 2012-01-07 NOTE — Assessment & Plan Note (Signed)
Patient refuses colonoscopy. Will check stool hemoccult.

## 2012-01-07 NOTE — Assessment & Plan Note (Signed)
Cholesterol is near goal of LDL less than 70. We'll not make changes to her regimen today. Question if he might be able to stop Zetia in the future. Followup here 6 months.

## 2012-01-07 NOTE — Assessment & Plan Note (Signed)
Blood pressure well-controlled today. We'll continue current medications. Will followup in 6 months.

## 2012-01-07 NOTE — Progress Notes (Signed)
Subjective:    Patient ID: [name], male    DOB: 10-20-1945, 66 y.o.   MRN: [id number]  HPI 66 year old male with history of coronary artery disease status post CABG, hypertension, hyperlipidemia, COPD, arthritis presents to establish care. He reports that he is generally feeling well. He denies any current chest pain, shortness of breath, fatigue. He recently retired from his job as a Emergency planning/management officer and is very active spending time with his family. He notes some occasional arthritis pain, but only occasionally takes medication such as ibuprofen for this.  Outpatient Encounter Prescriptions as of 01/07/2012  Medication Sig Dispense Refill  . aspirin 325 MG tablet Take 325 mg by mouth daily.      Marland Kitchen atorvastatin (LIPITOR) 10 MG tablet Take 1 tablet (10 mg total) by mouth daily.  30 tablet  5  . ezetimibe (ZETIA) 10 MG tablet Take 1 tablet (10 mg total) by mouth daily.  30 tablet  5  . metoprolol tartrate (LOPRESSOR) 25 MG tablet Take 1.5 tablets (37.5 mg total) by mouth 2 (two) times daily.  90 tablet  5  . Multiple Vitamin (MULTI VITAMIN MENS PO) Take 1 tablet by mouth daily.      . nitroGLYCERIN (NITROSTAT) 0.4 MG SL tablet Place 0.4 mg under the tongue every 5 (five) minutes as needed.      . Omega-3 Fatty Acids (FISH OIL) 1000 MG CAPS Take 1 capsule by mouth daily.      Marland Kitchen tiotropium (SPIRIVA HANDIHALER) 18 MCG inhalation capsule Place 1 capsule (18 mcg total) into inhaler and inhale daily.  30 capsule  2   BP 124/70  Pulse 74  Ht 5\' 9"  (1.753 m)  Wt 227 lb 8 oz (103.193 kg)  BMI 33.60 kg/m2  SpO2 94%  Review of Systems  Constitutional: Negative for fever, chills, activity change, appetite change, fatigue and unexpected weight change.  Eyes: Negative for visual disturbance.  Respiratory: Negative for cough and shortness of breath.   Cardiovascular: Negative for chest pain, palpitations and leg swelling.  Gastrointestinal: Negative for abdominal pain and abdominal distention.    Genitourinary: Negative for dysuria, urgency and difficulty urinating.  Musculoskeletal: Negative for arthralgias and gait problem.  Skin: Negative for color change and rash.  Hematological: Negative for adenopathy.  Psychiatric/Behavioral: Negative for disturbed wake/sleep cycle and dysphoric mood. The patient is not nervous/anxious.        Objective:   Physical Exam  Constitutional: He is oriented to person, place, and time. He appears well-developed and well-nourished. No distress.  HENT:  Head: Normocephalic and atraumatic.  Right Ear: External ear normal.  Left Ear: External ear normal.  Nose: Nose normal.  Mouth/Throat: Oropharynx is clear and moist. No oropharyngeal exudate.  Eyes: Conjunctivae and EOM are normal. Pupils are equal, round, and reactive to light. Right eye exhibits no discharge. Left eye exhibits no discharge. No scleral icterus.  Neck: Normal range of motion. Neck supple. No tracheal deviation present. No thyromegaly present.  Cardiovascular: Normal rate, regular rhythm and normal heart sounds.  Exam reveals no gallop and no friction rub.   No murmur heard. Pulmonary/Chest: Effort normal and breath sounds normal. No respiratory distress. He has no wheezes. He has no rales. He exhibits no tenderness.  Abdominal: Soft. Bowel sounds are normal. He exhibits no distension. There is no tenderness.  Musculoskeletal: Normal range of motion. He exhibits no edema.  Lymphadenopathy:    He has no cervical adenopathy.  Neurological: He is alert and oriented to person,  place, and time. No cranial nerve deficit. Coordination normal.  Skin: Skin is warm and dry. No rash noted. He is not diaphoretic. No erythema. No pallor.  Psychiatric: He has a normal mood and affect. His behavior is normal. Judgment and thought content normal.          Assessment & Plan:

## 2012-01-16 ENCOUNTER — Other Ambulatory Visit: Payer: Medicare Other

## 2012-02-24 ENCOUNTER — Telehealth: Payer: Self-pay | Admitting: Pulmonary Disease

## 2012-02-24 NOTE — Telephone Encounter (Signed)
Order was placed 01/01/12. Please advise pcc's thanks

## 2012-02-24 NOTE — Telephone Encounter (Signed)
Not sure what happened refaxed pul rehab to [facility] [place]

## 2012-04-07 ENCOUNTER — Encounter: Payer: Self-pay | Admitting: Cardiovascular Disease

## 2012-04-07 ENCOUNTER — Ambulatory Visit (INDEPENDENT_AMBULATORY_CARE_PROVIDER_SITE_OTHER): Payer: Medicare Other | Admitting: Cardiovascular Disease

## 2012-04-07 VITALS — BP 122/80 | HR 53 | Ht 69.0 in | Wt 224.0 lb

## 2012-04-07 DIAGNOSIS — E785 Hyperlipidemia, unspecified: Secondary | ICD-10-CM

## 2012-04-07 DIAGNOSIS — I4891 Unspecified atrial fibrillation: Secondary | ICD-10-CM

## 2012-04-07 DIAGNOSIS — E7800 Pure hypercholesterolemia, unspecified: Secondary | ICD-10-CM

## 2012-04-07 DIAGNOSIS — I25810 Atherosclerosis of coronary artery bypass graft(s) without angina pectoris: Secondary | ICD-10-CM

## 2012-04-07 DIAGNOSIS — I10 Essential (primary) hypertension: Secondary | ICD-10-CM

## 2012-04-07 NOTE — Patient Instructions (Addendum)
Please increase the lipitor to 20 mg once a day (cut the lipitor 40 mg  in 1/2) Cut the zetia in 1/2 daily (5 mg)  If you have no leg cramps after a few weeks, consider increasing the lipitor to 40 mg once a day and hold the zetia  Please call us if you have new issues that need to be addressed before your next appt.  Your physician wants you to follow-up in: 6 months.  You will receive a reminder letter in the mail two months in advance. If you don't receive a letter, please call our office to schedule the follow-up appointment.

## 2012-04-07 NOTE — Assessment & Plan Note (Signed)
Currently with no symptoms of angina. No further workup at this time. Continue current medication regimen. 

## 2012-04-07 NOTE — Assessment & Plan Note (Addendum)
We'll increase Lipitor to 20 mg daily, cut the zetia in half. Recheck cholesterol in 3 months time. If no leg pain, could try to increase Lipitor to 40 mg daily and hold zetia.

## 2012-04-07 NOTE — Progress Notes (Signed)
Patient ID: [name], male    DOB: [birth year], 66 y.o.   MRN: [id number]  HPI Comments: Mr. Falanga is a 66 year old gentleman with a history of smoking, coronary artery disease, occluded LAD in the mid vessel, 60% left main, 99% distal RCA who was sent to [facility] in October 2004 bypass surgery. He received 3 vessel bypass by Dr.Gerhardt with a LIMA to the LAD, vein graft to the OM1 and vein graft to the PDA, with subsequent cardiac catheter March 2011 for chest discomfort showing occluded vein grafts x2 with patent LIMA. He has collateral vessels from left to right. Ejection fraction 55% mild anterolateral hypokinesis.     chronic leg pain since he was young. He also reports having leg pain prior to his bypass surgery.  He reports that overall he is doing well. He is unable to afford zetia  as this costs $60 per month. He has had other medical bills for his wife who is now on disability. He has established care at the [facility] in most of his prescriptions will be done through the air. He is tolerating low dose Lipitor well. Currently on Lipitor 10 mg daily with no leg pain . LDL 85 . Previous significant leg pain on Crestor  He was admitted to the hospital on September 26 2011 for malaise, appearing pale, irregular heart rhythm and noted to be in atrial fibrillation. He was given medication for rhythm control and he converted to normal sinus rhythm later that evening.  Echocardiogram showed ejection fraction 35-45%, mildly elevated right ventricular systolic pressures estimated at 30-40 mm mercury. He is currently taking simvastatin 20 mg daily. He was started on pradaxa 150 mg twice a day. Isosorbide and ACE inhibitor were held secondary to low blood pressure. BNP was 282 on that admission . He has been taking metoprolol tartrate 25 mg  QID  EKG shows normal sinus rhythm with rate 53  beats per minute with no significant ST or T-wave changes       Outpatient Encounter Prescriptions as of  04/07/2012  Medication Sig Dispense Refill  . aspirin 325 MG tablet Take 325 mg by mouth daily.      Marland Kitchen ezetimibe (ZETIA) 10 MG tablet Take 1 tablet (10 mg total) by mouth daily.  30 tablet  5  . metoprolol tartrate (LOPRESSOR) 25 MG tablet Take 1.5 tablets (37.5 mg total) by mouth 2 (two) times daily.  90 tablet  5  . Multiple Vitamin (MULTI VITAMIN MENS PO) Take 1 tablet by mouth daily.      . nitroGLYCERIN (NITROSTAT) 0.4 MG SL tablet Place 0.4 mg under the tongue every 5 (five) minutes as needed.      . Omega-3 Fatty Acids (FISH OIL) 1000 MG CAPS Take 1 capsule by mouth daily.      Marland Kitchen tiotropium (SPIRIVA HANDIHALER) 18 MCG inhalation capsule Place 1 capsule (18 mcg total) into inhaler and inhale daily.  30 capsule  2  .  atorvastatin (LIPITOR) 10 MG tablet Take 1 tablet (10 mg total) by mouth daily.  30 tablet  5     Review of Systems  Constitutional: Negative.   HENT: Negative.   Eyes: Negative.   Cardiovascular: Negative.   Gastrointestinal: Negative.   Musculoskeletal: Positive for joint swelling, arthralgias and gait problem.  Skin: Negative.   Neurological: Negative.   Hematological: Negative.   Psychiatric/Behavioral: Negative.   All other systems reviewed and are negative.   BP 122/80  Pulse 53  Ht  5\' 9"  (1.753 m)  Wt 224 lb (101.606 kg)  BMI 33.08 kg/m2  Physical Exam  Nursing note and vitals reviewed. Constitutional: He is oriented to person, place, and time. He appears well-developed and well-nourished.  HENT:  Head: Normocephalic.  Nose: Nose normal.  Mouth/Throat: Oropharynx is clear and moist.  Eyes: Conjunctivae normal are normal. Pupils are equal, round, and reactive to light.  Neck: Normal range of motion. Neck supple. No JVD present.  Cardiovascular: Normal rate, regular rhythm, S1 normal, S2 normal, normal heart sounds and intact distal pulses.  Exam reveals no gallop and no friction rub.   No murmur heard. Pulmonary/Chest: Effort normal. No respiratory  distress. He has decreased breath sounds. He has no wheezes. He has no rales. He exhibits no tenderness.  Abdominal: Soft. Bowel sounds are normal. He exhibits no distension. There is no tenderness.  Musculoskeletal: Normal range of motion. He exhibits no edema and no tenderness.  Lymphadenopathy:    He has no cervical adenopathy.  Neurological: He is alert and oriented to person, place, and time. Coordination normal.  Skin: Skin is warm and dry. No rash noted. No erythema.  Psychiatric: He has a normal mood and affect. His behavior is normal. Judgment and thought content normal.           Assessment and Plan

## 2012-04-07 NOTE — Assessment & Plan Note (Signed)
Maintaining normal sinus rhythm 

## 2012-04-08 ENCOUNTER — Telehealth: Payer: Self-pay | Admitting: Internal Medicine

## 2012-04-08 LAB — LIPID PANEL
Chol/HDL Ratio: 3.3 ratio units (ref 0.0–5.0)
HDL: 39 mg/dL — ABNORMAL LOW (ref >39)
LDL Calculated: 76 mg/dL (ref 0–99)
VLDL Cholesterol Cal: 14 mg/dL (ref 5–40)

## 2012-04-08 NOTE — Telephone Encounter (Signed)
Call and spoke with pt wife to let him know [facility]

## 2012-05-11 ENCOUNTER — Encounter: Payer: Self-pay | Admitting: Cardiovascular Disease

## 2012-05-11 ENCOUNTER — Other Ambulatory Visit: Payer: Self-pay

## 2012-05-12 ENCOUNTER — Telehealth: Payer: Self-pay

## 2012-05-12 NOTE — Telephone Encounter (Signed)
LMTCB re: RX left at [facility] ready to be picked up

## 2012-05-18 ENCOUNTER — Telehealth: Payer: Self-pay | Admitting: Pulmonary Disease

## 2012-05-18 ENCOUNTER — Encounter: Payer: Self-pay | Admitting: Pulmonary Disease

## 2012-05-18 MED ORDER — TIOTROPIUM BROMIDE MONOHYDRATE 18 MCG IN CAPS: 18.0000 ug | ORAL_CAPSULE | Freq: Every day | RESPIRATORY_TRACT | Status: DC

## 2012-05-18 NOTE — Telephone Encounter (Signed)
I spoke with pt and he was calling to schedule his follow up with Dr. Kendrick Fries. He is scheduled 05/21/12 at 1:30 in [place]. Nothing further was needed

## 2012-05-18 NOTE — Telephone Encounter (Signed)
Spoke with pt's wife.  She will have pt call to schedule a 4 month follow up with Dr. Kendrick Fries as she does not know when he will be able to come in.  They would like to pick up a 90 day Spiriva rx in [place].  Verlon Au, will you pls print Spiriva rx and place at [facility] office for pt to pick up?  Thank you.

## 2012-05-18 NOTE — Telephone Encounter (Signed)
lmomtcb x1 for pt--needs to schedule OV for refills since he is overdue for this.

## 2012-05-21 ENCOUNTER — Ambulatory Visit (INDEPENDENT_AMBULATORY_CARE_PROVIDER_SITE_OTHER): Payer: Medicare Other | Admitting: Pulmonary Disease

## 2012-05-21 ENCOUNTER — Encounter: Payer: Self-pay | Admitting: Pulmonary Disease

## 2012-05-21 VITALS — BP 140/82 | HR 80 | Temp 97.5°F | Ht 69.0 in | Wt 225.0 lb

## 2012-05-21 DIAGNOSIS — J449 Chronic obstructive pulmonary disease, unspecified: Secondary | ICD-10-CM

## 2012-05-21 DIAGNOSIS — Z23 Encounter for immunization: Secondary | ICD-10-CM

## 2012-05-21 MED ORDER — AZITHROMYCIN 250 MG PO TABS: ORAL_TABLET | ORAL | Status: DC

## 2012-05-21 NOTE — Progress Notes (Signed)
Subjective:    Patient ID: [name], male    DOB: October 05, 1945, 66 y.o.   MRN: [id number]  Synopsis: [name] was diagnosed with COPD the pulmonary function testing in 2013. He has had a dramatic response with Spiriva.  HPI  05/21/2012 routine office visit--[name] returns to clinic today stating that he is doing quite well. He states that his shortness of breath is improved greatly since starting the Spiriva. He attempted to followup with pulmonary rehabilitation but he declined to go there because of cost. He feels that his exercise tolerance is increased and the shortness of breath overall has improved since he is electing to exercise on exam. In the last 4 days he's developed chest congestion and sinus drainage. He has not had fevers, chills, chest pain or significant shortness of breath. He has not been able to produce sputum.  Past Medical History  Diagnosis Date  . Coronary artery disease   . Pure hypercholesterolemia   . Hypertension   . Atrial fibrillation     possibly resolved   . History of arthritis   . Arthritis   . Chicken pox   . Arrhythmia   . Heart disease   . Rheumatic fever   . COPD (chronic obstructive pulmonary disease)      Review of Systems  Constitutional: Negative for fever, chills and fatigue.  HENT: Positive for congestion and rhinorrhea.   Respiratory: Positive for cough. Negative for shortness of breath and wheezing.        Objective:   Physical Exam  Filed Vitals:   05/21/12 1330  BP: 140/82  Pulse: 80  Temp: 97.5 F (36.4 C)  TempSrc: Oral  Height: 5\' 9"  (1.753 m)  Weight: 225 lb (102.059 kg)  SpO2: 96%   Gen: well appearing, no acute distress HEENT: NCAT, OP clear, TM's, Nasal mucosa clear PULM: few exp wheezes R > L CV: RRR, no mgr, no JVD Ext: warm, no edema, no clubbing, no cyanosis Derm: no rash or skin breakdown  12/2011 PFT [facility] Ratio 59%, FEV1 2.09 L (70% pred) TLC 6.15 L (97% pred) DLCO 79% pred 11/2011 CXR >> no  acute lung disease 05/2012 [facility] 1     Assessment & Plan:   COPD (chronic obstructive pulmonary disease) Grade B disease.  Doing great on Spiriva.  He has slight chest congestion today from a URI without fever, dyspnea to suggest pneumonia.  Plan: -azithromycin x5 days -continue spiriva -annual flu shot -f/u 6 months   Updated Medication List Outpatient Encounter Prescriptions as of 05/21/2012  Medication Sig Dispense Refill  . aspirin 325 MG tablet Take 325 mg by mouth daily.      Marland Kitchen atorvastatin (LIPITOR) 40 MG tablet Take 1 tablet (40 mg total) by mouth daily.  90 tablet  3  . metoprolol tartrate (LOPRESSOR) 25 MG tablet Take 1.5 tablets (37.5 mg total) by mouth 2 (two) times daily.  90 tablet  5  . Multiple Vitamin (MULTI VITAMIN MENS PO) Take 1 tablet by mouth daily.      . nitroGLYCERIN (NITROSTAT) 0.4 MG SL tablet Place 0.4 mg under the tongue every 5 (five) minutes as needed.      . Omega-3 Fatty Acids (FISH OIL) 1000 MG CAPS Take 1 capsule by mouth daily.      Marland Kitchen tiotropium (SPIRIVA HANDIHALER) 18 MCG inhalation capsule Place 1 capsule (18 mcg total) into inhaler and inhale daily.  30 capsule  2  . azithromycin (ZITHROMAX Z-PAK) 250 MG tablet 500mg   po daily x1 day, then 250mg  daily x4 days  6 each  0  . DISCONTD: ezetimibe (ZETIA) 10 MG tablet Take 1 tablet (10 mg total) by mouth daily.  30 tablet  5  . DISCONTD: tiotropium (SPIRIVA HANDIHALER) 18 MCG inhalation capsule Place 1 capsule (18 mcg total) into inhaler and inhale daily.  90 capsule  3

## 2012-05-21 NOTE — Patient Instructions (Signed)
Continue taking the Spiriva as you are doing. Take the Z-pack as written Call us if you develop shortness of breath We will see you back in 6 months or sooner if needed

## 2012-05-21 NOTE — Assessment & Plan Note (Addendum)
Grade B disease.  Doing great on Spiriva.  He has slight chest congestion today from a URI without fever, dyspnea to suggest pneumonia.  Plan: -azithromycin x5 days -continue spiriva -annual flu shot -f/u 6 months

## 2012-07-02 LAB — HM COLONOSCOPY: HM Colonoscopy: NORMAL

## 2012-07-08 ENCOUNTER — Ambulatory Visit (INDEPENDENT_AMBULATORY_CARE_PROVIDER_SITE_OTHER): Payer: Medicare Other | Admitting: Internal Medicine

## 2012-07-08 ENCOUNTER — Encounter: Payer: Self-pay | Admitting: Internal Medicine

## 2012-07-08 VITALS — BP 140/78 | HR 62 | Temp 98.1°F | Resp 16 | Ht 69.0 in | Wt 227.5 lb

## 2012-07-08 DIAGNOSIS — Z0000 Encounter for general adult medical examination without abnormal findings: Secondary | ICD-10-CM

## 2012-07-08 DIAGNOSIS — Z1331 Encounter for screening for depression: Secondary | ICD-10-CM

## 2012-07-08 DIAGNOSIS — S90129A Contusion of unspecified lesser toe(s) without damage to nail, initial encounter: Secondary | ICD-10-CM

## 2012-07-08 DIAGNOSIS — S90229A Contusion of unspecified lesser toe(s) with damage to nail, initial encounter: Secondary | ICD-10-CM | POA: Insufficient documentation

## 2012-07-08 NOTE — Progress Notes (Signed)
Subjective:    Patient ID: Willliam Doyle, male    DOB: [birth year], 66 y.o.   MRN: [id number]  HPI The patient is here for annual Medicare wellness examination and management of other chronic and acute problems.   The risk factors are reflected in the social history.  The roster of all physicians providing medical care to patient - is listed in the Snapshot section of the chart.  Activities of daily living:  The patient is 100% independent in all ADLs: dressing, toileting, feeding as well as independent mobility  Home safety : The patient has smoke detectors in the home. They wear seatbelts. There are no firearms at home. There is no violence in the home.   There is no risks for hepatitis, STDs or HIV. There is no history of blood transfusion. They have traveled to [place] and had malaria in the past (served in [place]).  The patient has seen their dentist in the last six month. Has an upper denture. (Dr. Montez Morita) They have [facility] their [facility]  Hearing aids in place. They have deferred audiologic testing in the last year.  They do not  have excessive sun exposure. Discussed the need for sun protection: hats, long sleeves and use of sunscreen if there is significant sun exposure.   Diet: the importance of a healthy diet is discussed. They do have a healthy diet.  The benefits of regular aerobic exercise were discussed. 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  Depression screen: there are no signs or vegative symptoms of depression- irritability, change in appetite, anhedonia, sadness/tearfullness.  Cognitive assessment: the patient manages all their financial and personal affairs and is actively engaged. They could relate day,date,year and events.  The following portions of the patient's history were reviewed and updated as appropriate: allergies, current medications, past family history, past medical history,  past surgical history, past social  history  and problem list.  Visual acuity was not assessed per patient preference since he has regular follow up with his ophthalmologist. Hearing and body mass index were assessed and reviewed.   During the course of the visit the patient was educated and counseled about appropriate screening and preventive services including : fall prevention , diabetes screening, nutrition counseling, colorectal cancer screening, and recommended immunizations.    Only other concern today is a 6 month history of brownish discoloration under his right great toenail. He denies any known injury to his toenail. The area is not painful. It has not changed in size.   Outpatient Encounter Prescriptions as of 07/08/2012  Medication Sig Dispense Refill  . aspirin 325 MG tablet Take 325 mg by mouth daily.      Marland Kitchen atorvastatin (LIPITOR) 40 MG tablet Take 1 tablet (40 mg total) by mouth daily.  90 tablet  3  . metoprolol tartrate (LOPRESSOR) 25 MG tablet Take 1.5 tablets (37.5 mg total) by mouth 2 (two) times daily.  90 tablet  5  . Multiple Vitamin (MULTI VITAMIN MENS PO) Take 1 tablet by mouth daily.      . nitroGLYCERIN (NITROSTAT) 0.4 MG SL tablet Place 0.4 mg under the tongue every 5 (five) minutes as needed.      . Omega-3 Fatty Acids (FISH OIL) 1000 MG CAPS Take 1 capsule by mouth daily.      Marland Kitchen tiotropium (SPIRIVA HANDIHALER) 18 MCG inhalation capsule Place 1 capsule (18 mcg total) into inhaler and inhale daily.  30 capsule  2  . [DISCONTINUED]  azithromycin (ZITHROMAX Z-PAK) 250 MG tablet 500mg  po daily x1 day, then 250mg  daily x4 days  6 each  0   BP 140/78  Pulse 62  Temp 98.1 F (36.7 C) (Oral)  Resp 16  Ht 5\' 9"  (1.753 m)  Wt 227 lb 8 oz (103.193 kg)  BMI 33.60 kg/m2  SpO2 98%  Review of Systems  Constitutional: Negative for fever, chills, activity change, appetite change, fatigue and unexpected weight change.  Eyes: Negative for visual disturbance.  Respiratory: Negative for cough and shortness of  breath.   Cardiovascular: Negative for chest pain, palpitations and leg swelling.  Gastrointestinal: Negative for abdominal pain and abdominal distention.  Genitourinary: Negative for dysuria, urgency and difficulty urinating.  Musculoskeletal: Negative for arthralgias and gait problem.  Skin: Negative for color change and rash.  Hematological: Negative for adenopathy.  Psychiatric/Behavioral: Negative for sleep disturbance and dysphoric mood. The patient is not nervous/anxious.        Objective:   Physical Exam  Constitutional: He is oriented to person, place, and time. He appears well-developed and well-nourished. No distress.  HENT:  Head: Normocephalic and atraumatic.  Right Ear: External ear normal.  Left Ear: External ear normal.  Nose: Nose normal.  Mouth/Throat: Oropharynx is clear and moist. No oropharyngeal exudate.  Eyes: Conjunctivae normal and EOM are normal. Pupils are equal, round, and reactive to light. Right eye exhibits no discharge. Left eye exhibits no discharge. No scleral icterus.  Neck: Normal range of motion. Neck supple. No tracheal deviation present. No thyromegaly present.  Cardiovascular: Normal rate, regular rhythm and normal heart sounds.  Exam reveals no gallop and no friction rub.   No murmur heard. Pulmonary/Chest: Effort normal and breath sounds normal. No respiratory distress. He has no wheezes. He has no rales. He exhibits no tenderness.  Abdominal: Soft. Bowel sounds are normal. He exhibits no distension and no mass. There is no tenderness. There is no rebound and no guarding.  Musculoskeletal: Normal range of motion. He exhibits no edema.  Lymphadenopathy:    He has no cervical adenopathy.  Neurological: He is alert and oriented to person, place, and time. No cranial nerve deficit. Coordination normal.  Skin: Skin is warm and dry. No rash noted. He is not diaphoretic. No erythema. No pallor.     Psychiatric: He has a normal mood and affect. His  behavior is normal. Judgment and thought content normal.          Assessment & Plan:

## 2012-07-08 NOTE — Assessment & Plan Note (Signed)
Symptoms seem most consistent with hematoma under the great toenail. However, given persistence of lesion, will set up podiatry evaluation as it is difficult to tell whether there may be a nevus forming underneath the great toenail.

## 2012-07-08 NOTE — Assessment & Plan Note (Signed)
General medical exam is normal today. Health maintenance is up to date with the exception of tetanus and pertussis vaccine which patient will obtain at the [facility]. Will request recent labs from the [facility]. We'll also request report on recent colonoscopy. Encourage continued efforts at healthy diet and increase physical activity. Followup in 6 months or sooner as needed.

## 2012-09-30 ENCOUNTER — Ambulatory Visit (INDEPENDENT_AMBULATORY_CARE_PROVIDER_SITE_OTHER): Payer: Medicare Other | Admitting: Internal Medicine

## 2012-09-30 ENCOUNTER — Encounter: Payer: Self-pay | Admitting: Internal Medicine

## 2012-09-30 VITALS — BP 132/84 | HR 50 | Temp 97.5°F | Ht 69.0 in | Wt 225.0 lb

## 2012-09-30 DIAGNOSIS — J449 Chronic obstructive pulmonary disease, unspecified: Secondary | ICD-10-CM

## 2012-09-30 MED ORDER — PREDNISONE (PAK) 10 MG PO TABS: ORAL_TABLET | ORAL | Status: DC

## 2012-09-30 NOTE — Assessment & Plan Note (Addendum)
12/2011 PFT [facility] Ratio 59%, FEV1 2.09 L (70% pred) TLC 6.15 L (97% pred) DLCO 79% pred 11/2011 CXR >> no acute lung disease    GOLD I copd with Symptoms are markedly disproportionate to objective findings and not clear  His present "exacerbation" is a pulmonary issue but clearly has airway problems he localizes to his throat/ trachea.  DDX of  difficult airways managment all start with A and  include Adherence, Ace Inhibitors, Acid Reflux, Active Sinus Disease, Alpha 1 Antitripsin deficiency, Anxiety masquerading as Airways dz,  ABPA,  allergy(esp in young), Aspiration (esp in elderly), Adverse effects of DPI,  Active smokers, plus two Bs  = Bronchiectasis and Beta blocker use..and one C= CHF   Adherence is always the initial "prime suspect" and is a multilayered concern that requires a "trust but verify" approach in every patient - starting with knowing how to use medications, especially inhalers, correctly, keeping up with refills and understanding the fundamental difference between maintenance and prns vs those medications only taken for a very short course and then stopped and not refilled.   ? Acid reflux > max rx short term including dietary restrictions  ? Adverse effect of DPI > he's done well on spiriva to date so continue for now  ? Beta blocker effect > doubt with no active wheeze on exam but keep in mind should his symptoms prove refractory  For now rx only 6 days prednisone then regroup w/in 2 weeks if not better

## 2012-09-30 NOTE — Patient Instructions (Addendum)
Prednisone 10 mg take  4 each am x 2 days,   2 each am x 2 days,  1 each am x2days and stop  Stop fish oil  Try prilosec 20mg   Take 30-60 min before first meal of the day and Pepcid 20 mg one bedtime until cough and throat are completely gone  for at least a weekGERD (REFLUX)  is an extremely common cause of respiratory symptoms, many times with no significant heartburn at all.    It can be treated with medication, but also with lifestyle changes including avoidance of late meals, excessive alcohol, smoking cessation, and avoid fatty foods, chocolate, peppermint, colas, red wine, and acidic juices such as orange juice.  NO MINT OR MENTHOL PRODUCTS SO NO COUGH DROPS  USE SUGARLESS CANDY INSTEAD (jolley ranchers or Stover's)  NO OIL BASED VITAMINS - use powdered substitutes.

## 2012-09-30 NOTE — Progress Notes (Signed)
Subjective:    Patient ID: [name], male    DOB: Aug 18, 1945    MRN: [id number]  Synopsis: [name] was diagnosed with COPD GOLD I  PFTs 12/2011. He has had a dramatic response with Spiriva.  HPI  05/21/2012 routine office visit--[name] returns to clinic today stating that he is doing quite well. He states that his shortness of breath is improved greatly since starting the Spiriva. He attempted to followup with pulmonary rehabilitation but he declined to go there because of cost. He feels that his exercise tolerance is increased and the shortness of breath overall has improved since he is electing to exercise on exam. In the last 4 days he's developed chest congestion and sinus drainage. He has not had fevers, chills, chest pain or significant shortness of breath. He has not been able to produce sputum. rec Continue taking the Spiriva as you are doing. Take the Z-pack as written   09/30/2012 acute w/in ov/Wert cc Pt c/o dry cough and nasal congestion. "Feels like lump in throat". Also has had some increased SOB for the past 2 wks. Worse in evening and at hs. No purulent sputum, no better with saba   No obvious daytime variabilty or assoc   cp or chest tightness, subjective wheeze overt sinus or hb symptoms. No unusual exp hx or h/o childhood pna/ asthma or premature birth to his knowledge.   Once gets to sleep does  ok without nocturnal  or early am exacerbation  of respiratory  c/o's or need for noct saba. Also denies any obvious fluctuation of symptoms with weather or environmental changes or other aggravating or alleviating factors except as outlined above  ROS  The following are not active complaints unless bolded sore throat, dysphagia, dental problems, itching, sneezing,  nasal congestion or excess/ purulent secretions, ear ache,   fever, chills, sweats, unintended wt loss, pleuritic or exertional cp, hemoptysis,  orthopnea pnd or leg swelling, presyncope, palpitations, heartburn,  abdominal pain, anorexia, nausea, vomiting, diarrhea  or change in bowel or urinary habits, change in stools or urine, dysuria,hematuria,  rash, arthralgias, visual complaints, headache, numbness weakness or ataxia or problems with walking or coordination,  change in mood/affect or memory.              Past Medical History  Diagnosis Date  . Coronary artery disease   . Pure hypercholesterolemia   . Hypertension   . Atrial fibrillation     possibly resolved   . History of arthritis   . Arthritis   . Chicken pox   . Arrhythmia   . Heart disease   . Rheumatic fever   . COPD (chronic obstructive pulmonary disease)           Objective:  Elderly wm with gruff voice and prominent pseudowheeze Wt Readings from Last 3 Encounters:  09/30/12 225 lb (102.059 kg)  07/08/12 227 lb 8 oz (103.193 kg)  05/21/12 225 lb (102.059 kg)    HEENT mild turbinate edema.  Oropharynx no thrush or excess pnd or cobblestoning.  No JVD or cervical adenopathy. Mild accessory muscle hypertrophy. Trachea midline, nl thryroid. Chest was hyperinflated by percussion with diminished breath sounds and moderate increased exp time without wheeze. Hoover sign positive at mid inspiration. Regular rate and rhythm without murmur gallop or rub or increase P2 or edema.  Abd: no hsm, nl excursion. Ext warm without cyanosis or clubbing.    12/2011 PFT [facility] Ratio 59%, FEV1 2.09 L (70% pred) TLC 6.15 L (  97% pred) DLCO 79% pred 11/2011 CXR >> no acute lung disease 05/2012 [facility] 1     Assessment & Plan:

## 2012-10-28 ENCOUNTER — Ambulatory Visit (INDEPENDENT_AMBULATORY_CARE_PROVIDER_SITE_OTHER): Payer: Medicare Other | Admitting: Cardiovascular Disease

## 2012-10-28 ENCOUNTER — Encounter: Payer: Self-pay | Admitting: Cardiovascular Disease

## 2012-10-28 VITALS — BP 172/92 | HR 53 | Ht 69.0 in | Wt 227.2 lb

## 2012-10-28 DIAGNOSIS — I4891 Unspecified atrial fibrillation: Secondary | ICD-10-CM

## 2012-10-28 DIAGNOSIS — R0602 Shortness of breath: Secondary | ICD-10-CM

## 2012-10-28 DIAGNOSIS — Z87891 Personal history of nicotine dependence: Secondary | ICD-10-CM

## 2012-10-28 DIAGNOSIS — E785 Hyperlipidemia, unspecified: Secondary | ICD-10-CM

## 2012-10-28 DIAGNOSIS — I25810 Atherosclerosis of coronary artery bypass graft(s) without angina pectoris: Secondary | ICD-10-CM

## 2012-10-28 DIAGNOSIS — E7800 Pure hypercholesterolemia, unspecified: Secondary | ICD-10-CM

## 2012-10-28 DIAGNOSIS — I10 Essential (primary) hypertension: Secondary | ICD-10-CM

## 2012-10-28 MED ORDER — AMLODIPINE BESYLATE 10 MG PO TABS: 10.0000 mg | ORAL_TABLET | Freq: Every day | ORAL | Status: DC

## 2012-10-28 NOTE — Assessment & Plan Note (Signed)
Currently a nonsmoker

## 2012-10-28 NOTE — Assessment & Plan Note (Signed)
Currently with no symptoms of angina. No further workup at this time. Continue current medication regimen. 

## 2012-10-28 NOTE — Patient Instructions (Addendum)
You are doing well. No medication changes were made.  Keep an eye on your blood pressure If it runs consistently greater than 140 on the top, start amlodipine one a day  We will check lab work today  Please call us if you have new issues that need to be addressed before your next appt.  Your physician wants you to follow-up in: 6 months.  You will receive a reminder letter in the mail two months in advance. If you don't receive a letter, please call our office to schedule the follow-up appointment.

## 2012-10-28 NOTE — Assessment & Plan Note (Signed)
Underlying lung disease. Shortness of breath with heavy exertion, stable

## 2012-10-28 NOTE — Progress Notes (Signed)
Patient ID: [name], male    DOB: Dec 21, 1945, 67 y.o.   MRN: [id number]  HPI Comments: [name] is a 67 year old gentleman with a history of smoking, coronary artery disease, occluded LAD in the mid vessel, 60% left main, 99% distal RCA who was sent to [facility] in October 2010 bypass surgery. He received 3 vessel bypass by [name] with a LIMA to the LAD, vein graft to the OM1 and vein graft to the PDA, with subsequent cardiac catheter March 2011 for chest discomfort showing occluded vein grafts x2 with patent LIMA. He has collateral vessels from left to right. Ejection fraction 55% mild anterolateral hypokinesis. Smoked for 40 years, quit in 2009     chronic leg pain since he was young. He also reports having leg pain prior to his bypass surgery.  Overall he reports that he is doing well. Denies any significant chest pain. Mild chronic shortness of breath. He is tolerating generic Lipitor with manageable myalgias..  Previous significant leg pain on Crestor  He was admitted to the hospital on September 26 2011 for malaise, appearing pale, irregular heart rhythm and noted to be in atrial fibrillation. He was given medication for rhythm control and he converted to normal sinus rhythm later that evening.  Echocardiogram showed ejection fraction 35-45%, mildly elevated right ventricular systolic pressures estimated at 30-40 mm mercury. He is currently taking simvastatin 20 mg daily. He was started on pradaxa 150 mg twice a day. Isosorbide and ACE inhibitor were held secondary to low blood pressure. BNP was 282 on that admission . He has been taking metoprolol tartrate 25 mg  QID  EKG shows normal sinus rhythm with rate 53  beats per minute with no significant ST or T-wave changes       Outpatient Encounter Prescriptions as of 10/28/2012  Medication Sig Dispense Refill  . aspirin 325 MG tablet Take 325 mg by mouth daily.      Marland Kitchen atorvastatin (LIPITOR) 40 MG tablet Take 1 tablet (40 mg total)  by mouth daily.  90 tablet  3  . Coenzyme Q10 (CO Q-10) 100 MG CAPS Take 100 mg by mouth daily.      . metoprolol tartrate (LOPRESSOR) 25 MG tablet Take 1.5 tablets (37.5 mg total) by mouth 2 (two) times daily.  90 tablet  5  . Multiple Vitamin (MULTI VITAMIN MENS PO) Take 1 tablet by mouth daily.      . nitroGLYCERIN (NITROSTAT) 0.4 MG SL tablet Place 0.4 mg under the tongue every 5 (five) minutes as needed.      . tiotropium (SPIRIVA HANDIHALER) 18 MCG inhalation capsule Place 1 capsule (18 mcg total) into inhaler and inhale daily.  30 capsule  2  . [DISCONTINUED] predniSONE (STERAPRED UNI-PAK) 10 MG tablet Prednisone 10 mg take  4 each am x 2 days,   2 each am x 2 days,  1 each am x2days and stop  14 tablet  0   No facility-administered encounter medications on file as of 10/28/2012.     Review of Systems  Constitutional: Negative.   HENT: Negative.   Eyes: Negative.   Respiratory: Negative.   Cardiovascular: Negative.   Gastrointestinal: Negative.   Musculoskeletal: Positive for arthralgias and gait problem.  Skin: Negative.   Neurological: Negative.   Psychiatric/Behavioral: Negative.   All other systems reviewed and are negative.   BP 172/92  Pulse 53  Ht 5\' 9"  (1.753 m)  Wt 227 lb 4 oz (103.08 kg)  BMI 33.54  kg/m2  Physical Exam  Nursing note and vitals reviewed. Constitutional: He is oriented to person, place, and time. He appears well-developed and well-nourished.  HENT:  Head: Normocephalic.  Nose: Nose normal.  Mouth/Throat: Oropharynx is clear and moist.  Eyes: Conjunctivae are normal. Pupils are equal, round, and reactive to light.  Neck: Normal range of motion. Neck supple. No JVD present.  Cardiovascular: Normal rate, regular rhythm, S1 normal, S2 normal, normal heart sounds and intact distal pulses.  Exam reveals no gallop and no friction rub.   No murmur heard. Pulmonary/Chest: Effort normal. No respiratory distress. He has decreased breath sounds. He has no  wheezes. He has no rales. He exhibits no tenderness.  Abdominal: Soft. Bowel sounds are normal. He exhibits no distension. There is no tenderness.  Musculoskeletal: Normal range of motion. He exhibits no edema and no tenderness.  Lymphadenopathy:    He has no cervical adenopathy.  Neurological: He is alert and oriented to person, place, and time. Coordination normal.  Skin: Skin is warm and dry. No rash noted. No erythema.  Psychiatric: He has a normal mood and affect. His behavior is normal. Judgment and thought content normal.      Assessment and Plan        is

## 2012-10-28 NOTE — Assessment & Plan Note (Addendum)
Blood pressure is elevated. He is uncertain why. Even high on recheck by myself. Systolic 170. He reports that her blood pressures at home. We have sent in a prescription for amlodipine 10 mg daily. He will check his blood pressure at home for several days before starting the new medication. Goal systolic pressure less than 140

## 2012-10-28 NOTE — Assessment & Plan Note (Signed)
We'll check his lipids today. Goal LDL less than 70

## 2012-10-28 NOTE — Assessment & Plan Note (Signed)
Maintaining normal sinus rhythm 

## 2012-10-29 LAB — HEPATIC FUNCTION PANEL
AST: 19 IU/L (ref 0–40)
Albumin: 4.3 g/dL (ref 3.6–4.8)
Alkaline Phosphatase: 71 IU/L (ref 39–117)
Bilirubin, Direct: 0.15 mg/dL (ref 0.00–0.40)
Total Bilirubin: 0.5 mg/dL (ref 0.0–1.2)
Total Protein: 6.5 g/dL (ref 6.0–8.5)

## 2012-10-29 LAB — LIPID PANEL
Cholesterol, Total: 131 mg/dL (ref 100–199)
VLDL Cholesterol Cal: 18 mg/dL (ref 5–40)

## 2012-11-03 NOTE — Progress Notes (Signed)
Pt informed of lab results. 

## 2013-01-06 ENCOUNTER — Ambulatory Visit: Payer: Medicare Other | Admitting: Internal Medicine

## 2013-01-12 ENCOUNTER — Ambulatory Visit (INDEPENDENT_AMBULATORY_CARE_PROVIDER_SITE_OTHER): Payer: Medicare Other | Admitting: Internal Medicine

## 2013-01-12 ENCOUNTER — Encounter: Payer: Self-pay | Admitting: Internal Medicine

## 2013-01-12 VITALS — BP 142/88 | HR 56 | Temp 97.7°F | Wt 228.0 lb

## 2013-01-12 DIAGNOSIS — I10 Essential (primary) hypertension: Secondary | ICD-10-CM

## 2013-01-12 DIAGNOSIS — E7800 Pure hypercholesterolemia, unspecified: Secondary | ICD-10-CM

## 2013-01-12 DIAGNOSIS — I4891 Unspecified atrial fibrillation: Secondary | ICD-10-CM

## 2013-01-12 NOTE — Assessment & Plan Note (Signed)
Currently NSR. Will continue to monitor.

## 2013-01-12 NOTE — Progress Notes (Signed)
Subjective:    Patient ID: [name], male    DOB: [birth year], 67 y.o.   MRN: [id number]  HPI  67 year old male with history of hypertension, atrial fibrillation, coronary artery disease presents for followup. He reports he is generally been feeling well. He has been monitoring his blood pressure closely and taking amlodipine 10 mg only if blood pressure greater than 140/90. He reports that blood pressure is often 140/90 in the morning but tends to be closer to 120/80 in the afternoon. He denies any recent chest pain, shortness of breath, palpitations. No new concerns today.  Outpatient Encounter Prescriptions as of 01/12/2013  Medication Sig Dispense Refill  . amLODipine (NORVASC) 10 MG tablet Take 1 tablet (10 mg total) by mouth daily.  30 tablet  6  . aspirin 325 MG tablet Take 325 mg by mouth daily.      Marland Kitchen atorvastatin (LIPITOR) 40 MG tablet Take 1 tablet (40 mg total) by mouth daily.  90 tablet  3  . Coenzyme Q10 (CO Q-10) 100 MG CAPS Take 100 mg by mouth daily.      . metoprolol tartrate (LOPRESSOR) 25 MG tablet Take 1.5 tablets (37.5 mg total) by mouth 2 (two) times daily.  90 tablet  5  . Multiple Vitamin (MULTI VITAMIN MENS PO) Take 1 tablet by mouth daily.      . nitroGLYCERIN (NITROSTAT) 0.4 MG SL tablet Place 0.4 mg under the tongue every 5 (five) minutes as needed.      . tiotropium (SPIRIVA HANDIHALER) 18 MCG inhalation capsule Place 1 capsule (18 mcg total) into inhaler and inhale daily.  30 capsule  2   No facility-administered encounter medications on file as of 01/12/2013.   There were no vitals taken for this visit.  Review of Systems  Constitutional: Negative for fever, chills, activity change, appetite change, fatigue and unexpected weight change.  Eyes: Negative for visual disturbance.  Respiratory: Negative for cough and shortness of breath.   Cardiovascular: Negative for chest pain, palpitations and leg swelling.  Gastrointestinal: Negative for abdominal pain and  abdominal distention.  Genitourinary: Negative for dysuria, urgency and difficulty urinating.  Musculoskeletal: Negative for arthralgias and gait problem.  Skin: Negative for color change and rash.  Hematological: Negative for adenopathy.  Psychiatric/Behavioral: Negative for sleep disturbance and dysphoric mood. The patient is not nervous/anxious.        Objective:   Physical Exam  Constitutional: He is oriented to person, place, and time. He appears well-developed and well-nourished. No distress.  HENT:  Head: Normocephalic and atraumatic.  Right Ear: External ear normal.  Left Ear: External ear normal.  Nose: Nose normal.  Mouth/Throat: Oropharynx is clear and moist. No oropharyngeal exudate.  Eyes: Conjunctivae and EOM are normal. Pupils are equal, round, and reactive to light. Right eye exhibits no discharge. Left eye exhibits no discharge. No scleral icterus.  Neck: Normal range of motion. Neck supple. No tracheal deviation present. No thyromegaly present.  Cardiovascular: Normal rate, regular rhythm and normal heart sounds.  Exam reveals no gallop and no friction rub.   No murmur heard. Pulmonary/Chest: Effort normal and breath sounds normal. No respiratory distress. He has no wheezes. He has no rales. He exhibits no tenderness.  Musculoskeletal: Normal range of motion. He exhibits no edema.  Lymphadenopathy:    He has no cervical adenopathy.  Neurological: He is alert and oriented to person, place, and time. No cranial nerve deficit. Coordination normal.  Skin: Skin is warm and dry. No rash noted.  He is not diaphoretic. No erythema. No pallor.  Psychiatric: He has a normal mood and affect. His behavior is normal. Judgment and thought content normal.          Assessment & Plan:

## 2013-01-12 NOTE — Assessment & Plan Note (Signed)
Lab Results  Component Value Date   LDLCALC 79 10/28/2012     Reviewed recent lipids and LFTs. Will continue Atorvastatin. Follow up 6 months and prn.

## 2013-01-12 NOTE — Assessment & Plan Note (Signed)
Pt has been taking amlodipine intermittently, for BP>140/90. Will have him change to daily Amlodipine 5mg  daily. He will monitor BP and call if consistently >140/90. He will continue metoprolol.

## 2013-04-27 ENCOUNTER — Ambulatory Visit (INDEPENDENT_AMBULATORY_CARE_PROVIDER_SITE_OTHER): Payer: Medicare Other | Admitting: Cardiovascular Disease

## 2013-04-27 ENCOUNTER — Encounter: Payer: Self-pay | Admitting: Cardiovascular Disease

## 2013-04-27 ENCOUNTER — Encounter (INDEPENDENT_AMBULATORY_CARE_PROVIDER_SITE_OTHER): Payer: Self-pay

## 2013-04-27 VITALS — BP 128/78 | HR 53 | Ht 69.0 in | Wt 220.8 lb

## 2013-04-27 DIAGNOSIS — I4891 Unspecified atrial fibrillation: Secondary | ICD-10-CM

## 2013-04-27 DIAGNOSIS — I25810 Atherosclerosis of coronary artery bypass graft(s) without angina pectoris: Secondary | ICD-10-CM

## 2013-04-27 DIAGNOSIS — I10 Essential (primary) hypertension: Secondary | ICD-10-CM

## 2013-04-27 DIAGNOSIS — E7800 Pure hypercholesterolemia, unspecified: Secondary | ICD-10-CM

## 2013-04-27 DIAGNOSIS — M79609 Pain in unspecified limb: Secondary | ICD-10-CM

## 2013-04-27 DIAGNOSIS — M79606 Pain in leg, unspecified: Secondary | ICD-10-CM

## 2013-04-27 NOTE — Assessment & Plan Note (Signed)
Cholesterol is at goal on the current lipid regimen. No changes to the medications were made.  

## 2013-04-27 NOTE — Patient Instructions (Addendum)
You are doing well. No medication changes were made.  Stay on a 1/2 dose amlodipine  If you have significant fatigue, you could cut the metoprolol dose down to 25 mg twice a day  Keep your weight low  Please call us if you have new issues that need to be addressed before your next appt.  Your physician wants you to follow-up in: 6 months.  You will receive a reminder letter in the mail two months in advance. If you don't receive a letter, please call our office to schedule the follow-up appointment.

## 2013-04-27 NOTE — Assessment & Plan Note (Signed)
Currently with no symptoms of angina. No further workup at this time. Continue current medication regimen. 

## 2013-04-27 NOTE — Assessment & Plan Note (Signed)
Blood pressure is well controlled on today's visit. No changes made to the medications. 

## 2013-04-27 NOTE — Assessment & Plan Note (Signed)
No further episodes of arrhythmia. We'll continue aggressive beta blocker use. No symptoms with his bradycardia. We have suggested he has significant fatigue, he could decrease her metoprolol back to 25 mg twice a day.

## 2013-04-27 NOTE — Assessment & Plan Note (Signed)
This has been a chronic issue. Does not seem to be worse on the Lipitor.

## 2013-04-27 NOTE — Progress Notes (Signed)
Patient ID: [name], male    DOB: [birth year], 67 y.o.   MRN: [id number]  HPI Comments: Mr. Ethington is a 67 year old gentleman with a history of smoking, coronary artery disease, occluded LAD in the mid vessel, 60% left main, 99% distal RCA who was sent to [facility] in October 2010 bypass surgery. He received 3 vessel bypass by Dr.Gerhardt with a LIMA to the LAD, vein graft to the OM1 and vein graft to the PDA, with subsequent cardiac catheter March 2011 for chest discomfort showing occluded vein grafts x2 with patent LIMA. He has collateral vessels from left to right. Ejection fraction 55% mild anterolateral hypokinesis. Smoked for 40 years, quit in 2009     chronic leg pain since he was young. He also reports having leg pain prior to his bypass surgery.  Overall he reports that he is doing well. Denies any significant chest pain. Mild chronic shortness of breath. He is tolerating generic Lipitor with manageable myalgias. Previous significant leg pain on Crestor.   He is now retired, taking care of goats, rabbits, chickens. Very active, recently built a barn, tool shed, also a Administrator, arts. He is receiving some of his medical care from the [place] including hearing aids in vision care.   He was admitted to the hospital on September 26 2011 for malaise, appearing pale, irregular heart rhythm and noted to be in atrial fibrillation. He was given medication for rhythm control and he converted to normal sinus rhythm later that evening. No longer on anticoagulation. He has been maintaining normal sinus rhythm  Echocardiogram showed ejection fraction 35-45%, mildly elevated right ventricular systolic pressures estimated at 30-40 mm mercury.  EKG shows normal sinus rhythm with rate 53  beats per minute with no significant ST or T-wave changes He denies having any lightheadedness or dizziness on metoprolol 37.5 mg  twice a day       Outpatient Encounter Prescriptions as of 04/27/2013  Medication Sig  Dispense Refill  . amLODipine (NORVASC) 10 MG tablet Take 5 mg by mouth daily.      Marland Kitchen aspirin 325 MG tablet Take 325 mg by mouth daily.      Marland Kitchen atorvastatin (LIPITOR) 40 MG tablet Take 1 tablet (40 mg total) by mouth daily.  90 tablet  3  . Coenzyme Q10 (CO Q-10) 100 MG CAPS Take 100 mg by mouth daily.      . meloxicam (MOBIC) 15 MG tablet Take 7.5 mg by mouth daily.      . metoprolol tartrate (LOPRESSOR) 25 MG tablet Take 1.5 tablets (37.5 mg total) by mouth 2 (two) times daily.  90 tablet  5  . Multiple Vitamin (MULTI VITAMIN MENS PO) Take 1 tablet by mouth daily.      . nitroGLYCERIN (NITROSTAT) 0.4 MG SL tablet Place 0.4 mg under the tongue every 5 (five) minutes as needed.      . tiotropium (SPIRIVA HANDIHALER) 18 MCG inhalation capsule Place 1 capsule (18 mcg total) into inhaler and inhale daily.  30 capsule  2  . [DISCONTINUED] amLODipine (NORVASC) 10 MG tablet Take 1 tablet (10 mg total) by mouth daily.  30 tablet  6   No facility-administered encounter medications on file as of 04/27/2013.     Review of Systems  Constitutional: Negative.   HENT: Negative.   Eyes: Negative.   Respiratory: Negative.   Cardiovascular: Negative.   Gastrointestinal: Negative.   Endocrine: Negative.   Musculoskeletal: Positive for arthralgias and gait problem.  Skin: Negative.  Allergic/Immunologic: Negative.   Neurological: Negative.   Hematological: Negative.   Psychiatric/Behavioral: Negative.   All other systems reviewed and are negative.   BP 128/78  Pulse 53  Ht 5\' 9"  (1.753 m)  Wt 220 lb 12 oz (100.132 kg)  BMI 32.58 kg/m2  Physical Exam  Nursing note and vitals reviewed. Constitutional: He is oriented to person, place, and time. He appears well-developed and well-nourished.  HENT:  Head: Normocephalic.  Nose: Nose normal.  Mouth/Throat: Oropharynx is clear and moist.  Eyes: Conjunctivae are normal. Pupils are equal, round, and reactive to light.  Neck: Normal range of motion.  Neck supple. No JVD present.  Cardiovascular: Normal rate, regular rhythm, S1 normal, S2 normal, normal heart sounds and intact distal pulses.  Exam reveals no gallop and no friction rub.   No murmur heard. Pulmonary/Chest: Effort normal. No respiratory distress. He has decreased breath sounds. He has no wheezes. He has no rales. He exhibits no tenderness.  Abdominal: Soft. Bowel sounds are normal. He exhibits no distension. There is no tenderness.  Musculoskeletal: Normal range of motion. He exhibits no edema and no tenderness.  Lymphadenopathy:    He has no cervical adenopathy.  Neurological: He is alert and oriented to person, place, and time. Coordination normal.  Skin: Skin is warm and dry. No rash noted. No erythema.  Psychiatric: He has a normal mood and affect. His behavior is normal. Judgment and thought content normal.      Assessment and Plan

## 2013-05-16 ENCOUNTER — Encounter: Payer: Self-pay | Admitting: *Deleted

## 2013-09-09 ENCOUNTER — Ambulatory Visit (INDEPENDENT_AMBULATORY_CARE_PROVIDER_SITE_OTHER): Payer: Medicare Other | Admitting: Cardiovascular Disease

## 2013-09-09 ENCOUNTER — Encounter: Payer: Self-pay | Admitting: Cardiovascular Disease

## 2013-09-09 VITALS — BP 142/74 | HR 54 | Ht 68.0 in | Wt 226.0 lb

## 2013-09-09 DIAGNOSIS — I10 Essential (primary) hypertension: Secondary | ICD-10-CM

## 2013-09-09 DIAGNOSIS — I4891 Unspecified atrial fibrillation: Secondary | ICD-10-CM

## 2013-09-09 DIAGNOSIS — E7800 Pure hypercholesterolemia, unspecified: Secondary | ICD-10-CM

## 2013-09-09 DIAGNOSIS — I25810 Atherosclerosis of coronary artery bypass graft(s) without angina pectoris: Secondary | ICD-10-CM

## 2013-09-09 DIAGNOSIS — I2510 Atherosclerotic heart disease of native coronary artery without angina pectoris: Secondary | ICD-10-CM

## 2013-09-09 NOTE — Assessment & Plan Note (Signed)
No further episodes of tachycardia concerning for atrial fibrillation. We'll continue metoprolol 25 mg twice a day.

## 2013-09-09 NOTE — Assessment & Plan Note (Signed)
Blood pressure is well controlled on today's visit. No changes made to the medications. 

## 2013-09-09 NOTE — Progress Notes (Signed)
Patient ID: [name], male    DOB: June 20, 1946, 68 y.o.   MRN: [id number]  HPI Comments: [name] is a 68 year old gentleman with a history of smoking, coronary artery disease, occluded LAD in the mid vessel, 60% left main, 99% distal RCA who was sent to [name] in October 2010 for bypass surgery. He received 3 vessel bypass by Dr.Gerhardt with a LIMA to the LAD, vein graft to the OM1 and vein graft to the PDA, with subsequent cardiac catheter March 2011 for chest discomfort showing occluded vein grafts x2 with patent LIMA. He has collateral vessels from left to right. Ejection fraction 55% mild anterolateral hypokinesis. He Smoked for 40 years, quit in 2009    On prior visits he reported having chronic leg pain since he was young.   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 Recently laid flooring in 3 rooms.  retired, taking care of goats, rabbits, chickens.  recently built a barn, tool shed, also a Camera operator.  He is receiving some of his medical care from the [place] including. Previously applied for agent orange benefits  Overall he reports that he is doing well. Denies any significant chest pain. Mild chronic shortness of breath particularly with exertion. He is tolerating generic Lipitor with manageable myalgias. Previous significant leg pain on Crestor.    admitted to the hospital on September 26 2011 for malaise, appearing pale, irregular heart rhythm and noted to be in atrial fibrillation. He was given medication for rhythm control and he converted to normal sinus rhythm later that evening. No longer on anticoagulation. He has been maintaining normal sinus rhythm No symptoms concerning for atrial fibrillation since that time  Echocardiogram showed ejection fraction 35-45%, mildly elevated right ventricular systolic pressures estimated at 30-40 mm mercury.  EKG shows normal sinus rhythm with rate 54  beats per minute with no significant ST or  T-wave changes  Outpatient Encounter Prescriptions as of 09/09/2013  Medication Sig  . amLODipine (NORVASC) 10 MG tablet Take 5 mg by mouth daily.  Marland Kitchen aspirin 325 MG tablet Take 325 mg by mouth daily.  Marland Kitchen atorvastatin (LIPITOR) 40 MG tablet Take 1 tablet (40 mg total) by mouth daily.  . Coenzyme Q10 (CO Q-10) 100 MG CAPS Take 100 mg by mouth daily.  . meloxicam (MOBIC) 15 MG tablet Take 7.5 mg by mouth daily.  . metoprolol tartrate (LOPRESSOR) 25 MG tablet Take 25 mg by mouth 2 (two) times daily.  . Multiple Vitamin (MULTI VITAMIN MENS PO) Take 1 tablet by mouth daily.  . nitroGLYCERIN (NITROSTAT) 0.4 MG SL tablet Place 0.4 mg under the tongue every 5 (five) minutes as needed.  . tiotropium (SPIRIVA HANDIHALER) 18 MCG inhalation capsule Place 1 capsule (18 mcg total) into inhaler and inhale daily.     Review of Systems  Constitutional: Negative.   HENT: Negative.   Eyes: Negative.   Respiratory: Negative.   Cardiovascular: Negative.   Gastrointestinal: Negative.   Endocrine: Negative.   Musculoskeletal: Positive for arthralgias and gait problem.  Skin: Negative.   Allergic/Immunologic: Negative.   Neurological: Negative.   Hematological: Negative.   Psychiatric/Behavioral: Negative.   All other systems reviewed and are negative.   BP 142/74  Pulse 54  Ht 5\' 8"  (1.727 m)  Wt 226 lb (102.513 kg)  BMI 34.37 kg/m2  Physical Exam  Nursing note and vitals reviewed. Constitutional: He is oriented to person, place, and time. He appears well-developed  and well-nourished.  HENT:  Head: Normocephalic.  Nose: Nose normal.  Mouth/Throat: Oropharynx is clear and moist.  Eyes: Conjunctivae are normal. Pupils are equal, round, and reactive to light.  Neck: Normal range of motion. Neck supple. No JVD present.  Cardiovascular: Normal rate, regular rhythm, S1 normal, S2 normal, normal heart sounds and intact distal pulses.  Exam reveals no gallop and no friction rub.   No murmur  heard. Pulmonary/Chest: Effort normal. No respiratory distress. He has decreased breath sounds. He has no wheezes. He has no rales. He exhibits no tenderness.  Abdominal: Soft. Bowel sounds are normal. He exhibits no distension. There is no tenderness.  Musculoskeletal: Normal range of motion. He exhibits no edema and no tenderness.  Lymphadenopathy:    He has no cervical adenopathy.  Neurological: He is alert and oriented to person, place, and time. Coordination normal.  Skin: Skin is warm and dry. No rash noted. No erythema.  Psychiatric: He has a normal mood and affect. His behavior is normal. Judgment and thought content normal.      Assessment and Plan

## 2013-09-09 NOTE — Patient Instructions (Signed)
You are doing well. No medication changes were made.  We will check blood work today  Please call us if you have new issues that need to be addressed before your next appt.  Your physician wants you to follow-up in: 6 months.  You will receive a reminder letter in the mail two months in advance. If you don't receive a letter, please call our office to schedule the follow-up appointment.

## 2013-09-09 NOTE — Assessment & Plan Note (Signed)
We will check his lipids on today's visit. Goal LDL less than 70

## 2013-09-09 NOTE — Assessment & Plan Note (Signed)
Currently with no symptoms of angina. No further workup at this time. Continue current medication regimen. 

## 2013-09-13 LAB — LIPID PANEL
CHOLESTEROL TOTAL: 165 mg/dL (ref 100–199)
Chol/HDL Ratio: 4.3 ratio units (ref 0.0–5.0)
HDL: 38 mg/dL — AB (ref >39)
LDL Calculated: 102 mg/dL — ABNORMAL HIGH (ref 0–99)
Triglycerides: 124 mg/dL (ref 0–149)
VLDL CHOLESTEROL CAL: 25 mg/dL (ref 5–40)

## 2013-09-13 LAB — HEPATIC FUNCTION PANEL
ALBUMIN: 4.8 g/dL (ref 3.6–4.8)
ALK PHOS: 72 IU/L (ref 39–117)
ALT: 29 IU/L (ref 0–44)
AST: 28 IU/L (ref 0–40)
BILIRUBIN DIRECT: 0.2 mg/dL (ref 0.00–0.40)
Total Bilirubin: 0.7 mg/dL (ref 0.0–1.2)
Total Protein: 6.9 g/dL (ref 6.0–8.5)

## 2013-09-19 ENCOUNTER — Other Ambulatory Visit: Payer: Self-pay

## 2013-09-19 MED ORDER — ATORVASTATIN CALCIUM 80 MG PO TABS: 40.0000 mg | ORAL_TABLET | Freq: Every day | ORAL | Status: DC

## 2013-12-01 ENCOUNTER — Other Ambulatory Visit: Payer: Self-pay | Admitting: Cardiovascular Disease

## 2014-01-11 ENCOUNTER — Telehealth: Payer: Self-pay | Admitting: *Deleted

## 2014-01-11 NOTE — Telephone Encounter (Signed)
Nurse Manuela Schwartz) with [facility] called to let Dr. Rockey Situ know that this patient is not compliant with Atorvastatin. Records show meds were due 12/17/13 and has not been filled.

## 2014-02-18 ENCOUNTER — Ambulatory Visit: Payer: Self-pay | Admitting: Internal Medicine

## 2014-02-18 DIAGNOSIS — C9110 Chronic lymphocytic leukemia of B-cell type not having achieved remission: Secondary | ICD-10-CM

## 2014-02-18 HISTORY — DX: Chronic lymphocytic leukemia of B-cell type not having achieved remission: C91.10

## 2014-03-07 ENCOUNTER — Ambulatory Visit (INDEPENDENT_AMBULATORY_CARE_PROVIDER_SITE_OTHER): Payer: Medicare Other | Admitting: Internal Medicine

## 2014-03-07 ENCOUNTER — Inpatient Hospital Stay: Payer: Self-pay | Admitting: Internal Medicine

## 2014-03-07 ENCOUNTER — Encounter: Payer: Self-pay | Admitting: Internal Medicine

## 2014-03-07 VITALS — BP 118/72 | HR 115 | Ht 68.0 in | Wt 226.0 lb

## 2014-03-07 DIAGNOSIS — I10 Essential (primary) hypertension: Secondary | ICD-10-CM

## 2014-03-07 DIAGNOSIS — I4891 Unspecified atrial fibrillation: Secondary | ICD-10-CM

## 2014-03-07 DIAGNOSIS — D72829 Elevated white blood cell count, unspecified: Secondary | ICD-10-CM | POA: Insufficient documentation

## 2014-03-07 DIAGNOSIS — I2510 Atherosclerotic heart disease of native coronary artery without angina pectoris: Secondary | ICD-10-CM

## 2014-03-07 DIAGNOSIS — E785 Hyperlipidemia, unspecified: Secondary | ICD-10-CM

## 2014-03-07 LAB — DIFFERENTIAL
Lymphocytes: 42 %
Monocytes: 3 %
Other Cells Blood: 45
Segmented Neutrophils: 10 %

## 2014-03-07 LAB — CBC
HCT: 40.4 % (ref 40.0–52.0)
HGB: 12.9 g/dL — ABNORMAL LOW (ref 13.0–18.0)
MCH: 32.3 pg (ref 26.0–34.0)
MCHC: 32 g/dL (ref 32.0–36.0)
MCV: 101 fL — ABNORMAL HIGH (ref 80–100)
Platelet: 134 10*3/uL — ABNORMAL LOW (ref 150–440)
RBC: 4 10*6/uL — ABNORMAL LOW (ref 4.40–5.90)
RDW: 15.1 % — ABNORMAL HIGH (ref 11.5–14.5)
WBC: 88.9 10*3/uL — ABNORMAL HIGH (ref 3.8–10.6)

## 2014-03-07 LAB — BASIC METABOLIC PANEL
Anion Gap: 11 (ref 7–16)
BUN: 17 mg/dL (ref 7–18)
Calcium, Total: 8.4 mg/dL — ABNORMAL LOW (ref 8.5–10.1)
Chloride: 109 mmol/L — ABNORMAL HIGH (ref 98–107)
Co2: 23 mmol/L (ref 21–32)
Creatinine: 0.95 mg/dL (ref 0.60–1.30)
EGFR (African American): >60
EGFR (Non-African Amer.): >60
Glucose: 111 mg/dL — ABNORMAL HIGH (ref 65–99)
Osmolality: 287 (ref 275–301)
Potassium: 4.3 mmol/L (ref 3.5–5.1)
Sodium: 143 mmol/L (ref 136–145)

## 2014-03-07 LAB — CK TOTAL AND CKMB (NOT AT ARMC)
CK, Total: 49 U/L
CK, Total: 46 U/L
CK-MB: 0.9 ng/mL (ref 0.5–3.6)
CK-MB: 0.8 ng/mL (ref 0.5–3.6)

## 2014-03-07 LAB — TROPONIN I
TROPONIN-I: 0.07 ng/mL — AB
TROPONIN-I: 0.08 ng/mL — AB
Troponin-I: 0.08 ng/mL — ABNORMAL HIGH

## 2014-03-07 LAB — PRO B NATRIURETIC PEPTIDE: B-TYPE NATIURETIC PEPTID: 516 pg/mL — AB (ref 0–125)

## 2014-03-07 IMAGING — CR DG CHEST 1V PORT
1 series · 1 of 1 positions shown · non-contrast
Comparison: [DATE] and [DATE]

CLINICAL DATA: Heart is racing.

EXAM:
PORTABLE CHEST - 1 VIEW

[ap]
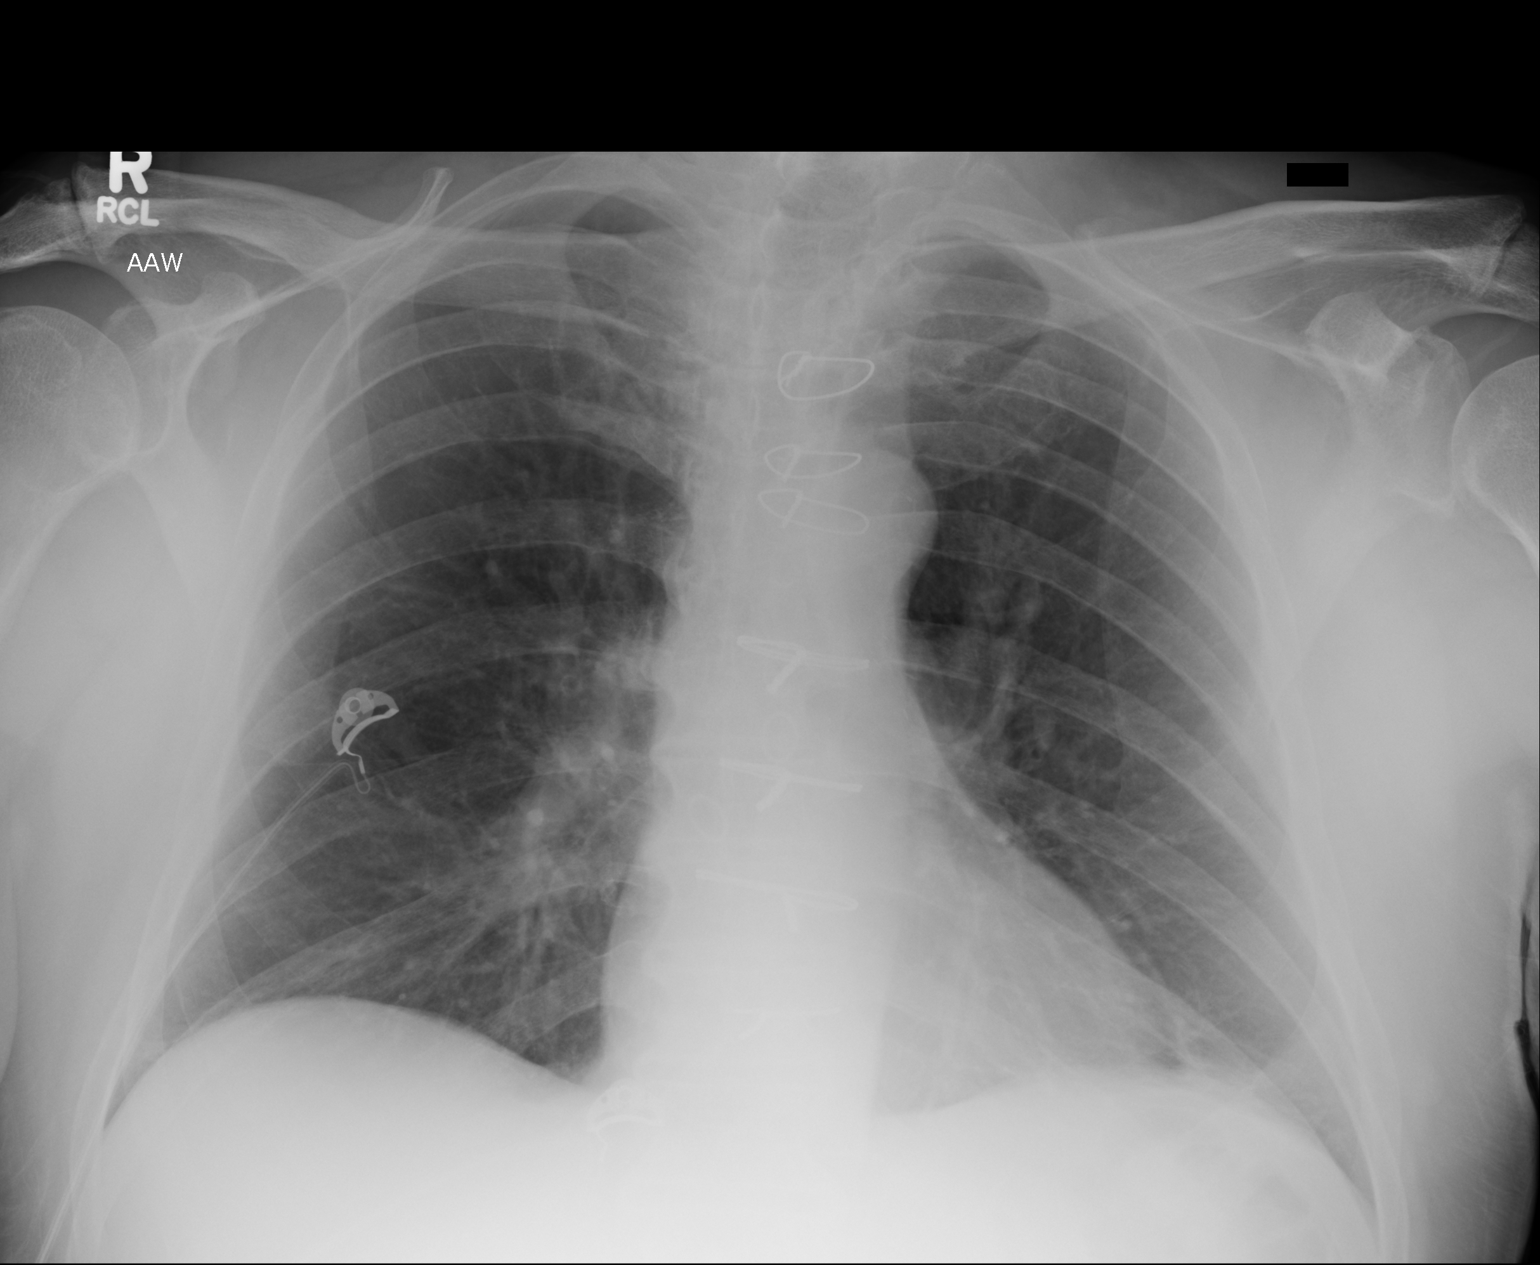

[1 of 1 positions shown; findings below may reference images not displayed]

FINDINGS: Single view of the chest was obtained. Few linear densities at the
left lung base suggest atelectasis. Otherwise, the lungs are clear
without edema. Heart size is normal with median sternotomy wires.
Negative for a pneumothorax.
IMPRESSION: Mild left basilar atelectasis.  Otherwise, no acute chest findings.

## 2014-03-07 NOTE — Progress Notes (Signed)
Patient Care Team: [name], MD as PCP - General (Internal Medicine)   HPI  [name] is a 68 y.o. male Is seen as an addon today as he awakened with tachypalpitations. Yesterday his pulses are normal.  His history of coronary artery disease with prior bypass surgery. Ejection fractions have been recorded currently between 35 and 55%; most recently by Myoview 2013 it was 55 percent.  He has a history of atrial fibrillation in 2013.he converted spontaneously.  Thromboembolic risk profile is notable for hypertension, coronary disease, and age her CHADS-VASc score of 3   Past Medical History  Diagnosis Date  . Coronary artery disease   . Pure hypercholesterolemia   . Hypertension   . Atrial fibrillation     possibly resolved   . History of arthritis   . Arthritis   . Chicken pox   . Arrhythmia   . Heart disease   . Rheumatic fever   . COPD (chronic obstructive pulmonary disease)   . Arthritis     Past Surgical History  Procedure Laterality Date  . Cardiac catheterization  2011    Dr Fletcher Anon  . Cholecystectomy    . Appendectomy  1988  . Tonsillectomy and adenoidectomy  1956  . Coronary artery bypass graft  2010    3v  . Hernia repair      abdominal    Current Outpatient Prescriptions  Medication Sig Dispense Refill  . amLODipine (NORVASC) 10 MG tablet Take 5 mg by mouth daily.      Marland Kitchen amLODipine (NORVASC) 10 MG tablet TAKE ONE TABLET BY MOUTH ONCE DAILY  30 tablet  6  . aspirin 325 MG tablet Take 325 mg by mouth daily.      Marland Kitchen atorvastatin (LIPITOR) 80 MG tablet Take 0.5 tablets (40 mg total) by mouth daily.  90 tablet  3  . budesonide-formoterol (SYMBICORT) 160-4.5 MCG/ACT inhaler Inhale 2 puffs into the lungs 2 (two) times daily as needed.  1 Inhaler  12  . Coenzyme Q10 (CO Q-10) 100 MG CAPS Take 100 mg by mouth daily.      . meloxicam (MOBIC) 15 MG tablet Take 7.5 mg by mouth daily.      . metoprolol tartrate (LOPRESSOR) 25 MG tablet Take 25 mg  by mouth 2 (two) times daily.      . Multiple Vitamin (MULTI VITAMIN MENS PO) Take 1 tablet by mouth daily.      . nitroGLYCERIN (NITROSTAT) 0.4 MG SL tablet Place 0.4 mg under the tongue every 5 (five) minutes as needed.      . tiotropium (SPIRIVA HANDIHALER) 18 MCG inhalation capsule Place 1 capsule (18 mcg total) into inhaler and inhale daily.  30 capsule  2  . [DISCONTINUED] isosorbide mononitrate (IMDUR) 30 MG 24 hr tablet Take 1 tablet (30 mg total) by mouth daily.  30 tablet  6   No current facility-administered medications for this visit.    No Known Allergies  Review of Systems negative except from HPI and PMH  Physical Exam There were no vitals taken for this visit. Well developed and well nourished in no acute distress HENT normal E scleral and icterus clear Neck Supple JVP flat; carotids brisk and full Clear to ausculation Irregular rate and rhythm, no murmurs gallops or rub Soft with active bowel sounds No clubbing cyanosis Trace Edema Alert and oriented, grossly normal motor and sensory function Skin Warm and Dry  ECG demonstrates atrial fibrillation at 118  Assessment  and  Plan  Atrial fibrillation with rapid ventricular response  Coronary artery disease with prior bypass surgery and previous normal LV function  Volume overload with peripheral edema  The patient has rapid atrial fibrillation. Hopefully he will convert spontaneously on his own. In the event that he does not, knowing that he was in sinus rhythm yesterday, he could be cardioverted tomorrow. He'll need to be started on anticoagulation. This will require a downward adjustment in his aspirin 325--81. I would use it adjunctively with a NOAC.  He will need reassessment of LV function I will defer this until he is his own sinus rhythm

## 2014-03-08 ENCOUNTER — Ambulatory Visit: Payer: Medicare Other | Admitting: Cardiovascular Disease

## 2014-03-08 DIAGNOSIS — I359 Nonrheumatic aortic valve disorder, unspecified: Secondary | ICD-10-CM

## 2014-03-08 LAB — CBC WITH DIFFERENTIAL/PLATELET
Basophil #: 0.2 10*3/uL — ABNORMAL HIGH (ref 0.0–0.1)
Basophil %: 0.3 %
Eosinophil #: 0.5 10*3/uL (ref 0.0–0.7)
Eosinophil %: 0.7 %
HCT: 38.6 % — ABNORMAL LOW (ref 40.0–52.0)
HGB: 12.1 g/dL — AB (ref 13.0–18.0)
Lymphocyte #: 69.2 10*3/uL — ABNORMAL HIGH (ref 1.0–3.6)
Lymphocyte %: 89.6 %
MCH: 32.1 pg (ref 26.0–34.0)
MCHC: 31.5 g/dL — AB (ref 32.0–36.0)
MCV: 102 fL — AB (ref 80–100)
MONOS PCT: 1.5 %
Monocyte #: 1.2 x10 3/mm — ABNORMAL HIGH (ref 0.2–1.0)
NEUTROS PCT: 7.9 %
Neutrophil #: 6.1 10*3/uL (ref 1.4–6.5)
Platelet: 124 10*3/uL — ABNORMAL LOW (ref 150–440)
RBC: 3.78 10*6/uL — ABNORMAL LOW (ref 4.40–5.90)
RDW: 15.1 % — ABNORMAL HIGH (ref 11.5–14.5)
WBC: 77.3 10*3/uL — ABNORMAL HIGH (ref 3.8–10.6)

## 2014-03-08 LAB — BASIC METABOLIC PANEL
Anion Gap: 8 (ref 7–16)
BUN: 15 mg/dL (ref 7–18)
Calcium, Total: 8.2 mg/dL — ABNORMAL LOW (ref 8.5–10.1)
Chloride: 110 mmol/L — ABNORMAL HIGH (ref 98–107)
Co2: 27 mmol/L (ref 21–32)
Creatinine: 1.11 mg/dL (ref 0.60–1.30)
GLUCOSE: 123 mg/dL — AB (ref 65–99)
Osmolality: 291 (ref 275–301)
POTASSIUM: 4.2 mmol/L (ref 3.5–5.1)
SODIUM: 145 mmol/L (ref 136–145)

## 2014-03-08 LAB — TSH: Thyroid Stimulating Horm: 1.06 u[IU]/mL

## 2014-03-08 LAB — LIPID PANEL
Cholesterol: 125 mg/dL (ref 0–200)
HDL Cholesterol: 31 mg/dL — ABNORMAL LOW (ref 40–60)
Ldl Cholesterol, Calc: 70 mg/dL (ref 0–100)
Triglycerides: 119 mg/dL (ref 0–200)
VLDL Cholesterol, Calc: 24 mg/dL (ref 5–40)

## 2014-03-09 ENCOUNTER — Telehealth: Payer: Self-pay | Admitting: Internal Medicine

## 2014-03-09 DIAGNOSIS — I219 Acute myocardial infarction, unspecified: Secondary | ICD-10-CM

## 2014-03-09 LAB — BASIC METABOLIC PANEL
Anion Gap: 8 (ref 7–16)
BUN: 12 mg/dL (ref 7–18)
CO2: 29 mmol/L (ref 21–32)
Calcium, Total: 8.3 mg/dL — ABNORMAL LOW (ref 8.5–10.1)
Chloride: 108 mmol/L — ABNORMAL HIGH (ref 98–107)
Creatinine: 1.11 mg/dL (ref 0.60–1.30)
EGFR (Non-African Amer.): >60
GLUCOSE: 121 mg/dL — AB (ref 65–99)
Osmolality: 290 (ref 275–301)
POTASSIUM: 4.2 mmol/L (ref 3.5–5.1)
Sodium: 145 mmol/L (ref 136–145)

## 2014-03-09 LAB — CBC WITH DIFFERENTIAL/PLATELET
COMMENT - H1-COM1: NORMAL
HCT: 39.2 % — AB (ref 40.0–52.0)
HGB: 12.6 g/dL — AB (ref 13.0–18.0)
Lymphocytes: 96 %
MCH: 32.7 pg (ref 26.0–34.0)
MCHC: 32.1 g/dL (ref 32.0–36.0)
MCV: 102 fL — AB (ref 80–100)
Platelet: 127 10*3/uL — ABNORMAL LOW (ref 150–440)
RBC: 3.85 10*6/uL — AB (ref 4.40–5.90)
RDW: 14.8 % — ABNORMAL HIGH (ref 11.5–14.5)
Segmented Neutrophils: 4 %
WBC: 79.1 10*3/uL — ABNORMAL HIGH (ref 3.8–10.6)

## 2014-03-09 NOTE — Telephone Encounter (Signed)
Needing hospital follow up Afib . Please advise where to put this patient.

## 2014-03-10 ENCOUNTER — Telehealth: Payer: Self-pay

## 2014-03-10 NOTE — Telephone Encounter (Signed)
Patient contacted regarding discharge from [facility] on 03/09/14.  Patient understands to follow up with Ignacia Bayley, NP on 03/20/14 at 1:15 at [facility]. Patient understands discharge instructions? yes Patient understands medications and regiment? yes Patient understands to bring all medications to this visit? yes

## 2014-03-10 NOTE — Telephone Encounter (Signed)
10:30am next Thursday.

## 2014-03-10 NOTE — Telephone Encounter (Signed)
Patient is aware of his scheduled appointment.

## 2014-03-13 ENCOUNTER — Encounter: Payer: Self-pay | Admitting: *Deleted

## 2014-03-16 ENCOUNTER — Encounter: Payer: Self-pay | Admitting: Internal Medicine

## 2014-03-16 ENCOUNTER — Ambulatory Visit (INDEPENDENT_AMBULATORY_CARE_PROVIDER_SITE_OTHER): Payer: Medicare Other | Admitting: Internal Medicine

## 2014-03-16 ENCOUNTER — Encounter: Payer: Self-pay | Admitting: *Deleted

## 2014-03-16 VITALS — BP 110/62 | HR 55 | Temp 97.6°F | Resp 14 | Ht 68.0 in | Wt 219.8 lb

## 2014-03-16 DIAGNOSIS — Z23 Encounter for immunization: Secondary | ICD-10-CM

## 2014-03-16 DIAGNOSIS — C9110 Chronic lymphocytic leukemia of B-cell type not having achieved remission: Secondary | ICD-10-CM

## 2014-03-16 DIAGNOSIS — E7800 Pure hypercholesterolemia, unspecified: Secondary | ICD-10-CM

## 2014-03-16 DIAGNOSIS — I4891 Unspecified atrial fibrillation: Secondary | ICD-10-CM

## 2014-03-16 LAB — LIPID PANEL
CHOL/HDL RATIO: 4
Cholesterol: 137 mg/dL (ref 0–200)
HDL: 33.1 mg/dL — AB (ref >39.00)
LDL CALC: 86 mg/dL (ref 0–99)
NonHDL: 103.9
TRIGLYCERIDES: 88 mg/dL (ref 0.0–149.0)
VLDL: 17.6 mg/dL (ref 0.0–40.0)

## 2014-03-16 LAB — COMPREHENSIVE METABOLIC PANEL
ALBUMIN: 4 g/dL (ref 3.5–5.2)
ALK PHOS: 62 U/L (ref 39–117)
ALT: 18 U/L (ref 0–53)
AST: 21 U/L (ref 0–37)
BILIRUBIN TOTAL: 0.9 mg/dL (ref 0.2–1.2)
BUN: 12 mg/dL (ref 6–23)
CO2: 27 mEq/L (ref 19–32)
Calcium: 8.9 mg/dL (ref 8.4–10.5)
Chloride: 107 mEq/L (ref 96–112)
Creatinine, Ser: 0.9 mg/dL (ref 0.4–1.5)
GFR: 91.33 mL/min (ref >60.00)
Glucose, Bld: 103 mg/dL — ABNORMAL HIGH (ref 70–99)
POTASSIUM: 4.5 meq/L (ref 3.5–5.1)
SODIUM: 139 meq/L (ref 135–145)
Total Protein: 6.7 g/dL (ref 6.0–8.3)

## 2014-03-16 NOTE — Assessment & Plan Note (Signed)
Noted to have elevated WBC 88K on recent hospital admission. Flow cytometry consistent with CLL. Follow up with Dr. Ma Hillock as scheduled next week.

## 2014-03-16 NOTE — Progress Notes (Signed)
Pre visit review using our clinic review tool, if applicable. No additional management support is needed unless otherwise documented below in the visit note. 

## 2014-03-16 NOTE — Assessment & Plan Note (Signed)
S/p recent admission for AFIB with RVR. Required amiodarone for conversion, however now on Metoprolol alone. NSR today. Continue Metoprolol and Eliquis. Follow up with Cardiology next week.

## 2014-03-16 NOTE — Patient Instructions (Signed)
Follow up with Cardiology and Oncology as scheduled.

## 2014-03-16 NOTE — Assessment & Plan Note (Signed)
Recent LDL above goal. Will check lipids and LFTs with labs today.

## 2014-03-16 NOTE — Progress Notes (Signed)
Subjective:    Patient ID: [name], male    DOB: 1946-06-04, 68 y.o.   MRN: [id number]  HPI 68YO male presents for hospital follow up. Admitted [facility] 03/07/2014 for Afib with RVR and leukocytosis WBC 89K Discharged 03/09/2014  Woke up on 8/18, felt generally bad. Tried to take pulse and could not measure it, as it was so rapid. Took BP meds and HR seemed to improve. Eventually, measured HR 157. Went to class at [employer] office. HR was noted to be irregular by the nurse. Went to see cardiologist who admitted pt. This is third time this has happened. Admitted and treated with Metoprolol and Cardizem, as well as Amiodarone. Converted to NSR. ECHO showed normal EF. Started on Eliquis.  Feeling well. No palpitations. No chest pain. Mild chronic dyspnea. No bleeding noted on Eliquis.  Scheduled to see Dr. Ma Hillock to follow up CLL next week. Also has follow up at the [facility].  Review of Systems  Constitutional: Negative for fever, chills, activity change, appetite change, fatigue and unexpected weight change.  Eyes: Negative for visual disturbance.  Respiratory: Positive for shortness of breath (chronic, no change from baseline). Negative for cough.   Cardiovascular: Negative for chest pain, palpitations and leg swelling.  Gastrointestinal: Negative for nausea, vomiting, abdominal pain, diarrhea, constipation and abdominal distention.  Genitourinary: Negative for dysuria, urgency and difficulty urinating.  Musculoskeletal: Negative for arthralgias and gait problem.  Skin: Negative for color change and rash.  Hematological: Negative for adenopathy.  Psychiatric/Behavioral: Negative for sleep disturbance and dysphoric mood. The patient is not nervous/anxious.        Objective:    BP 110/62  Pulse 55  Temp(Src) 97.6 F (36.4 C) (Oral)  Resp 14  Ht 5\' 8"  (1.727 m)  Wt 219 lb 12 oz (99.678 kg)  BMI 33.42 kg/m2  SpO2 96% Physical Exam  Constitutional: He is oriented to  person, place, and time. He appears well-developed and well-nourished. No distress.  HENT:  Head: Normocephalic and atraumatic.  Right Ear: External ear normal.  Left Ear: External ear normal.  Nose: Nose normal.  Mouth/Throat: Oropharynx is clear and moist. No oropharyngeal exudate.  Eyes: Conjunctivae and EOM are normal. Pupils are equal, round, and reactive to light. Right eye exhibits no discharge. Left eye exhibits no discharge. No scleral icterus.  Neck: Normal range of motion. Neck supple. No tracheal deviation present. No thyromegaly present.  Cardiovascular: Regular rhythm and normal heart sounds.  Bradycardia present.  Exam reveals no gallop and no friction rub.   No murmur heard. Pulmonary/Chest: Effort normal and breath sounds normal. No accessory muscle usage. Not tachypneic. No respiratory distress. He has no decreased breath sounds. He has no wheezes. He has no rhonchi. He has no rales. He exhibits no tenderness.  Musculoskeletal: Normal range of motion. He exhibits no edema.  Lymphadenopathy:    He has no cervical adenopathy.  Neurological: He is alert and oriented to person, place, and time. No cranial nerve deficit. Coordination normal.  Skin: Skin is warm and dry. No rash noted. He is not diaphoretic. No erythema. No pallor.  Psychiatric: He has a normal mood and affect. His behavior is normal. Judgment and thought content normal.          Assessment & Plan:   Problem List Items Addressed This Visit     Unprioritized   Atrial fibrillation - Primary     S/p recent admission for AFIB with RVR. Required amiodarone for conversion, however now  on Metoprolol alone. NSR today. Continue Metoprolol and Eliquis. Follow up with Cardiology next week.    Relevant Medications      apixaban (ELIQUIS) 5 MG TABS tablet   Other Relevant Orders      Comprehensive metabolic panel      Lipid Profile      EKG 12-Lead (Completed)   Chronic lymphatic leukemia     Noted to have  elevated WBC 88K on recent hospital admission. Flow cytometry consistent with CLL. Follow up with Dr. Ma Hillock as scheduled next week.    HYPERCHOLESTEROLEMIA     Recent LDL above goal. Will check lipids and LFTs with labs today.    Relevant Medications      apixaban (ELIQUIS) 5 MG TABS tablet    Other Visit Diagnoses   Need for prophylactic vaccination and inoculation against influenza        Relevant Orders       Flu Vaccine QUAD 36+ mos PF IM (Fluarix Quad PF) (Completed)        Return in about 3 months (around 06/16/2014).

## 2014-03-20 ENCOUNTER — Ambulatory Visit (INDEPENDENT_AMBULATORY_CARE_PROVIDER_SITE_OTHER): Payer: Medicare Other | Admitting: Nurse Practitioner

## 2014-03-20 ENCOUNTER — Encounter: Payer: Self-pay | Admitting: Nurse Practitioner

## 2014-03-20 VITALS — BP 120/70 | HR 57 | Ht 69.0 in | Wt 220.0 lb

## 2014-03-20 DIAGNOSIS — E785 Hyperlipidemia, unspecified: Secondary | ICD-10-CM

## 2014-03-20 DIAGNOSIS — I10 Essential (primary) hypertension: Secondary | ICD-10-CM

## 2014-03-20 DIAGNOSIS — I4891 Unspecified atrial fibrillation: Secondary | ICD-10-CM

## 2014-03-20 DIAGNOSIS — I480 Paroxysmal atrial fibrillation: Secondary | ICD-10-CM

## 2014-03-20 DIAGNOSIS — I25810 Atherosclerosis of coronary artery bypass graft(s) without angina pectoris: Secondary | ICD-10-CM

## 2014-03-20 MED ORDER — ATORVASTATIN CALCIUM 80 MG PO TABS: 40.0000 mg | ORAL_TABLET | Freq: Every day | ORAL | Status: DC

## 2014-03-20 MED ORDER — APIXABAN 5 MG PO TABS: 5.0000 mg | ORAL_TABLET | Freq: Two times a day (BID) | ORAL | Status: DC

## 2014-03-20 MED ORDER — AMLODIPINE BESYLATE 10 MG PO TABS: 5.0000 mg | ORAL_TABLET | Freq: Every day | ORAL | Status: DC

## 2014-03-20 MED ORDER — TIOTROPIUM BROMIDE MONOHYDRATE 18 MCG IN CAPS: 18.0000 ug | ORAL_CAPSULE | Freq: Every day | RESPIRATORY_TRACT | Status: DC

## 2014-03-20 MED ORDER — BUDESONIDE-FORMOTEROL FUMARATE 160-4.5 MCG/ACT IN AERO: 2.0000 | INHALATION_SPRAY | Freq: Two times a day (BID) | RESPIRATORY_TRACT | Status: DC | PRN

## 2014-03-20 MED ORDER — METOPROLOL TARTRATE 25 MG PO TABS: 25.0000 mg | ORAL_TABLET | Freq: Two times a day (BID) | ORAL | Status: DC

## 2014-03-20 NOTE — Patient Instructions (Signed)
Your physician recommends that you continue on your current medications as directed. Please refer to the Current Medication list given to you today.  Your physician recommends that you schedule a follow-up appointment in: 3 months w/ Dr. Gollan  

## 2014-03-20 NOTE — Progress Notes (Signed)
Patient Name: [name] Date of Encounter: 03/20/2014  Primary Care Provider:  [name], MD Primary Cardiologist:  Johnny Bridge, MD   Patient Profile  68 y/o male who presents today following recent admission to [facility] 2/2 afib with incidental finding of new CLL.  Problem List   Past Medical History  Diagnosis Date  . Coronary artery disease     a. 04/2009 CABG x 3 (LIMA->LAD, VG->OM1, VG->PDA);  b. 09/2009 Caht: occluded VG x 2 w/ patent LIMA and L->R collats. EF 55%, mild antlat HK;  c. 10/2011 MV: EF 53%, no isch/infarct-->low risk.  . Pure hypercholesterolemia   . Hypertension   . Atrial fibrillation     a. Dx 2013, recurred 02/2014, CHA2DS2VASc = 3 -->placed on Eliquis;  b. 02/2014 Echo: EF 50-55%, mid and apical anterior septum and mid and apical inf septum are abnl, mild to mod Ao sclerosis w/o AS.  Marland Kitchen History of arthritis   . Arthritis   . Chicken pox   . Rheumatic fever   . COPD (chronic obstructive pulmonary disease)   . Arthritis   . Chronic lymphocytic leukemia     a. Dx 02/2014.   Past Surgical History  Procedure Laterality Date  . Cardiac catheterization  2011    Dr Fletcher Anon  . Cholecystectomy    . Appendectomy  1988  . Tonsillectomy and adenoidectomy  1956  . Coronary artery bypass graft  2010    3v  . Hernia repair      abdominal    Allergies  No Known Allergies  HPI  68 y/o male with the above problem list.  He was recently admitted to [facility] 2/2 tachypalpitations and afib.  He was initially placed on dilt gtt or oral bb but after he did not convert, IV amio was added with subsequent conversion to sinus rhythm.  With a CHA2DS2VASc of 50m decision was made to initiate eliquis therapy.  During hospitalization, he was incidentally noted to have a white count of 88K.  He was seen by oncology and dx with CLL.  He has outpt f/u next week.    Since his d/c home, he has done well.  He denies chest pain, palpitations, dyspnea, pnd, orthopnea, n, v,  dizziness, syncope, edema, weight gain, or early satiety.  He is in sinus rhythm today.  He has f/u @ the [place] coming up and has requested new Rx for everything in order to have them filled there.  Home Medications  Prior to Admission medications   Medication Sig Start Date End Date Taking? Authorizing Provider  amLODipine (NORVASC) 10 MG tablet Take 0.5 tablets (5 mg total) by mouth daily. 03/20/14  Yes Rogelia Mire, NP  apixaban (ELIQUIS) 5 MG TABS tablet Take 1 tablet (5 mg total) by mouth 2 (two) times daily. 03/20/14  Yes Rogelia Mire, NP  atorvastatin (LIPITOR) 80 MG tablet Take 0.5 tablets (40 mg total) by mouth daily. 03/20/14  Yes Rogelia Mire, NP  budesonide-formoterol [facility]) 160-4.5 MCG/ACT inhaler Inhale 2 puffs into the lungs 2 (two) times daily as needed. 03/20/14  Yes Rogelia Mire, NP  Coenzyme Q10 (CO Q-10) 100 MG CAPS Take 100 mg by mouth daily.   Yes Historical Provider, MD  metoprolol tartrate (LOPRESSOR) 25 MG tablet Take 1 tablet (25 mg total) by mouth 2 (two) times daily. 03/20/14  Yes Rogelia Mire, NP  Multiple Vitamin (MULTI VITAMIN MENS PO) Take 1 tablet by mouth daily.   Yes Historical Provider,  MD  nitroGLYCERIN (NITROSTAT) 0.4 MG SL tablet Place 0.4 mg under the tongue every 5 (five) minutes as needed.   Yes Historical Provider, MD  tiotropium (SPIRIVA HANDIHALER) 18 MCG inhalation capsule Place 1 capsule (18 mcg total) into inhaler and inhale daily. 03/20/14 06/06/16 Yes Rogelia Mire, NP    Review of Systems  He denies chest pain, palpitations, dyspnea, pnd, orthopnea, n, v, dizziness, syncope, edema, weight gain, or early satiety.  All other systems reviewed and are otherwise negative except as noted above.  Physical Exam  Blood pressure 120/70, pulse 57, height 5\' 9"  (1.753 m), weight 220 lb (99.791 kg).  General: Pleasant, NAD Psych: Normal affect. Neuro: Alert and oriented X 3. Moves all extremities  spontaneously. HEENT: Normal  Neck: Supple without bruits or JVD. Lungs:  Resp regular and unlabored, CTA. Heart: RRR no s3, s4.  2/6 SEM heard throughout - loudest @ RUSB. Abdomen: Soft, non-tender, non-distended, BS + x 4.  Extremities: No clubbing, cyanosis or edema. DP/PT/Radials 2+ and equal bilaterally.  Accessory Clinical Findings  ECG - sinus brady, 57, no acute st/t changes.  Assessment & Plan  1.  PAF:  Recent admission for afib - converted to sinus with amio and then switched back to oral bb in setting of baseline bradycardia.  Tolerating eliquis though he thinks that the [place] is likely to switch him to an alternate agent.  No palpitations since discharge and he is in sinus today. Cont bb/eliquis.  2.  CAD:  No chest pain.  NL EF by echo while hospitalized.  Cont bb/statin.  Off asa in the setting of eliquis.  3.  CLL:  Newly dx. He has f/u with heme-onc next week.  4.  HL:  LDL 86 on 8/27.  LFT's wnl.  Cont current statin dose.  5.  HTN:  Stable.  6. Dispo:  F/U with Johnny Bridge, MD in 3 mos or sooner if necessary.  Murray Hodgkins, NP 03/20/2014, 2:07 PM

## 2014-03-21 ENCOUNTER — Ambulatory Visit: Payer: Self-pay | Admitting: Internal Medicine

## 2014-03-31 ENCOUNTER — Ambulatory Visit: Payer: Self-pay | Admitting: Internal Medicine

## 2014-04-06 ENCOUNTER — Encounter: Payer: Self-pay | Admitting: Cardiology

## 2014-04-07 DIAGNOSIS — E785 Hyperlipidemia, unspecified: Secondary | ICD-10-CM

## 2014-04-07 DIAGNOSIS — I10 Essential (primary) hypertension: Secondary | ICD-10-CM

## 2014-04-07 DIAGNOSIS — I25810 Atherosclerosis of coronary artery bypass graft(s) without angina pectoris: Secondary | ICD-10-CM

## 2014-04-18 LAB — CBC CANCER CENTER
BASOS PCT: 0.5 %
Basophil #: 0.3 x10 3/mm — ABNORMAL HIGH (ref 0.0–0.1)
EOS ABS: 0.3 x10 3/mm (ref 0.0–0.7)
EOS PCT: 0.5 %
HCT: 40.4 % (ref 40.0–52.0)
HGB: 13 g/dL (ref 13.0–18.0)
LYMPHS ABS: 57.3 x10 3/mm — AB (ref 1.0–3.6)
LYMPHS PCT: 90.4 %
MCH: 32 pg (ref 26.0–34.0)
MCHC: 32.1 g/dL (ref 32.0–36.0)
MCV: 100 fL (ref 80–100)
MONO ABS: 0.8 x10 3/mm (ref 0.2–1.0)
Monocyte %: 1.3 %
NEUTROS ABS: 4.6 x10 3/mm (ref 1.4–6.5)
NEUTROS PCT: 7.3 %
Platelet: 125 x10 3/mm — ABNORMAL LOW (ref 150–440)
RBC: 4.06 10*6/uL — AB (ref 4.40–5.90)
RDW: 14.9 % — AB (ref 11.5–14.5)
WBC: 63.4 x10 3/mm — AB (ref 3.8–10.6)

## 2014-04-18 LAB — CREATININE, SERUM
Creatinine: 1.08 mg/dL (ref 0.60–1.30)
EGFR (African American): >60
EGFR (Non-African Amer.): >60

## 2014-04-18 LAB — HEPATIC FUNCTION PANEL A (ARMC)
Albumin: 4 g/dL (ref 3.4–5.0)
Alkaline Phosphatase: 79 U/L
BILIRUBIN DIRECT: 0.1 mg/dL (ref 0.00–0.20)
Bilirubin,Total: 0.6 mg/dL (ref 0.2–1.0)
SGOT(AST): 17 U/L (ref 15–37)
SGPT (ALT): 37 U/L
Total Protein: 6.9 g/dL (ref 6.4–8.2)

## 2014-04-18 LAB — LACTATE DEHYDROGENASE: LDH: 151 U/L (ref 85–241)

## 2014-04-18 IMAGING — CT CT CHEST-ABD-PELV W/ CM
2 of 5 series · 13 of 46 positions shown, 15 images · IV contrast (isovue)
Comparison: None

CLINICAL DATA: History of chronic lymphocytic leukemia

EXAM:
CT CHEST, ABDOMEN, AND PELVIS WITH CONTRAST
TECHNIQUE: Multidetector CT imaging of the chest, abdomen and pelvis was
performed following the standard protocol during bolus
administration of intravenous contrast.
CONTRAST:  100 cc of Isovue 370

[Series 2: cap with · axial · 0.78mm/px · z∈[-264,+332]mm · 10 of 135 slices shown, 12 images]
[im 8/135  soft-tissue]
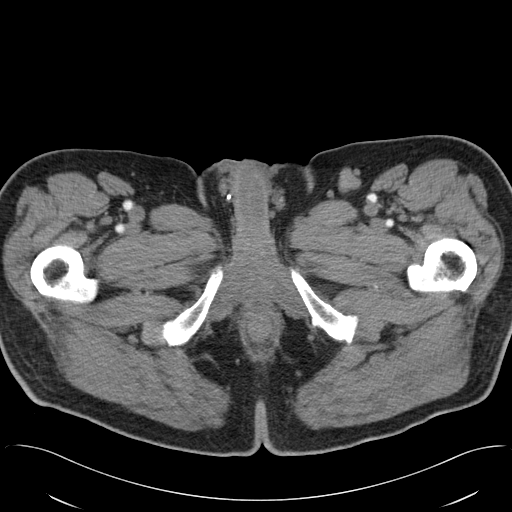
[im 8/135  bone]
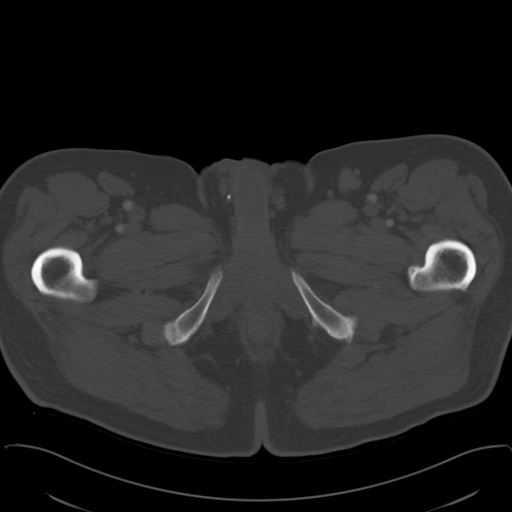
[im 23/135  soft-tissue]
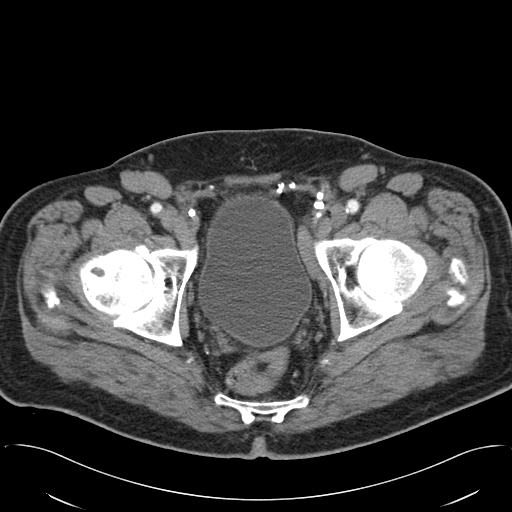
[im 38/135  soft-tissue]
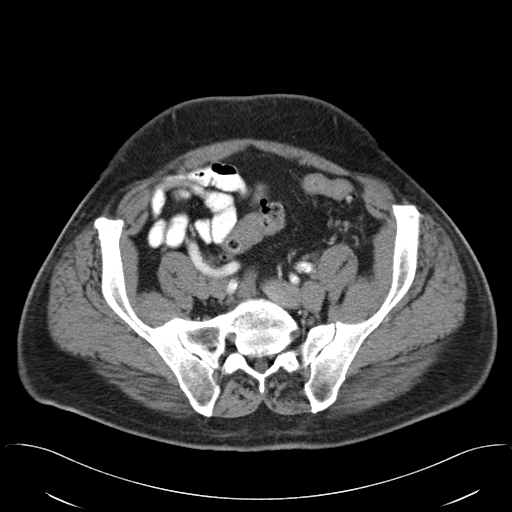
[im 45/135  soft-tissue]
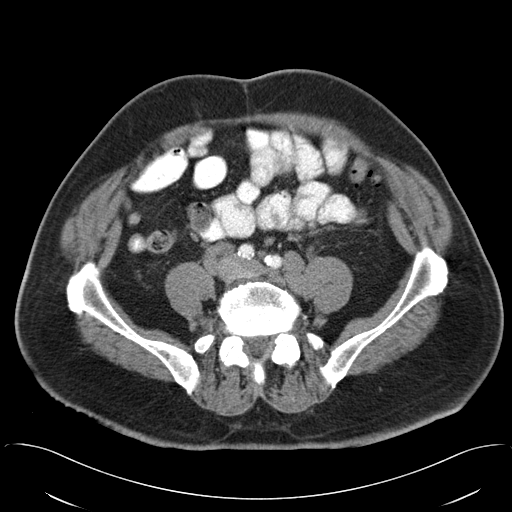
[im 60/135  soft-tissue]
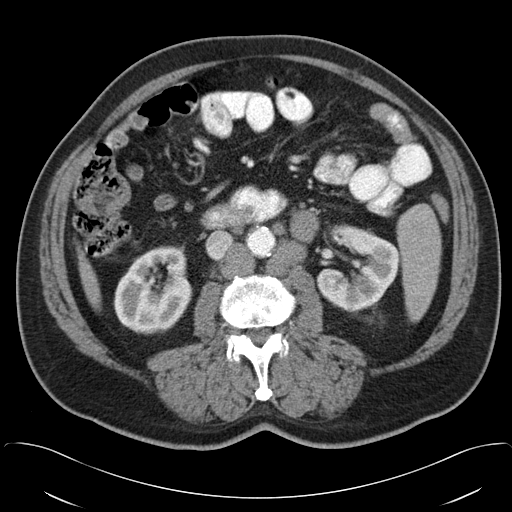
[im 75/135  soft-tissue]
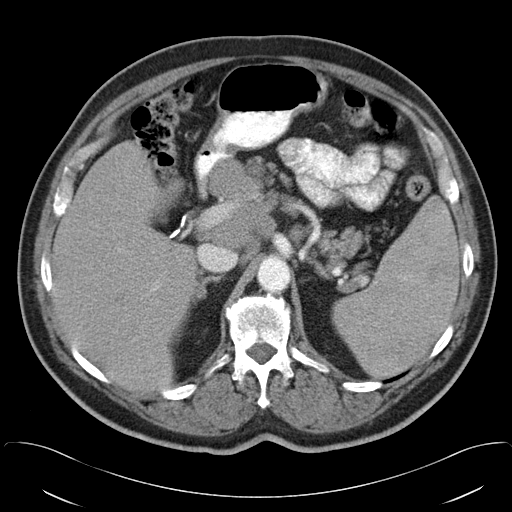
[im 90/135  soft-tissue]
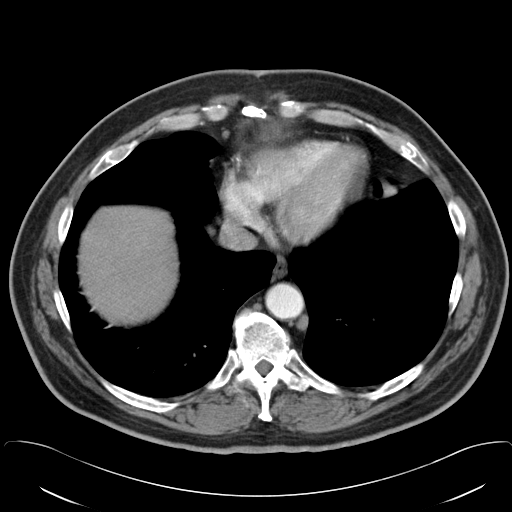
[im 97/135  soft-tissue]
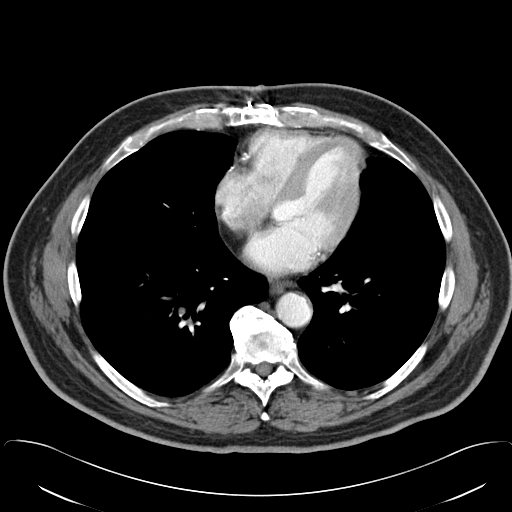
[im 112/135  soft-tissue]
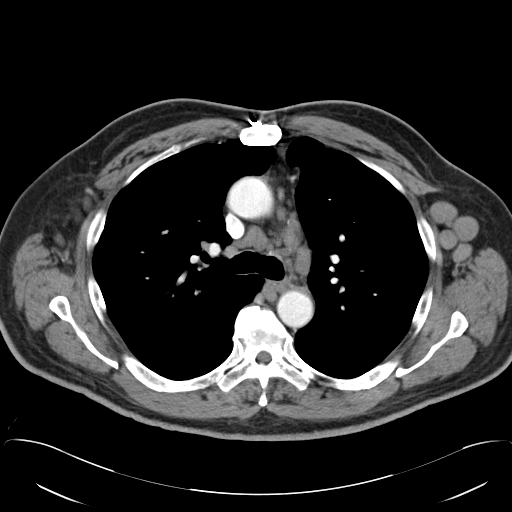
[im 112/135  bone]
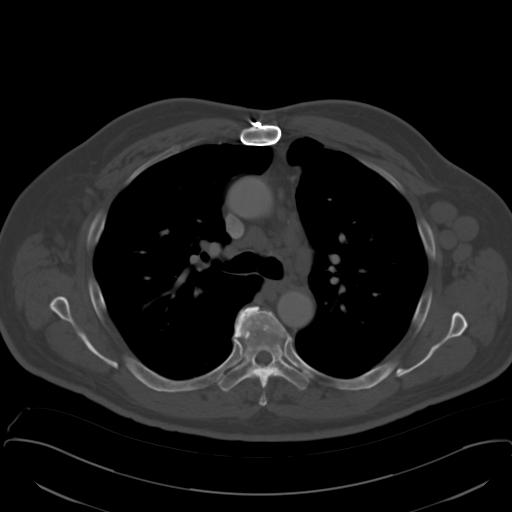
[im 127/135  soft-tissue]
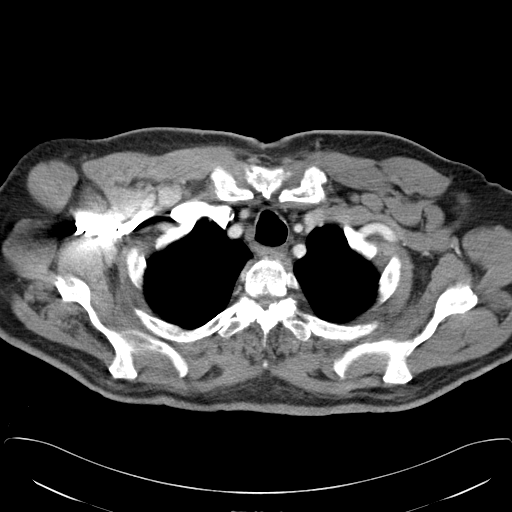

[Series 6: cor cap with · coronal · 0.86mm/px · 3 of 172 slices shown]
[im 58/172  soft-tissue]
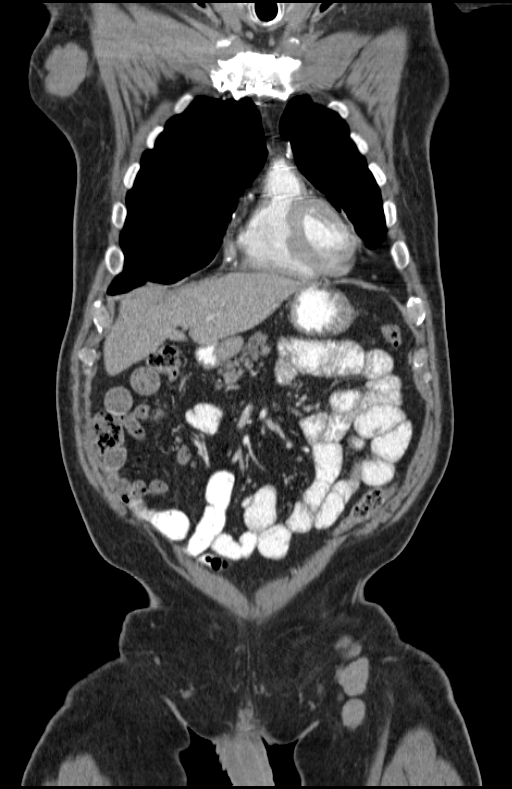
[im 77/172  soft-tissue]
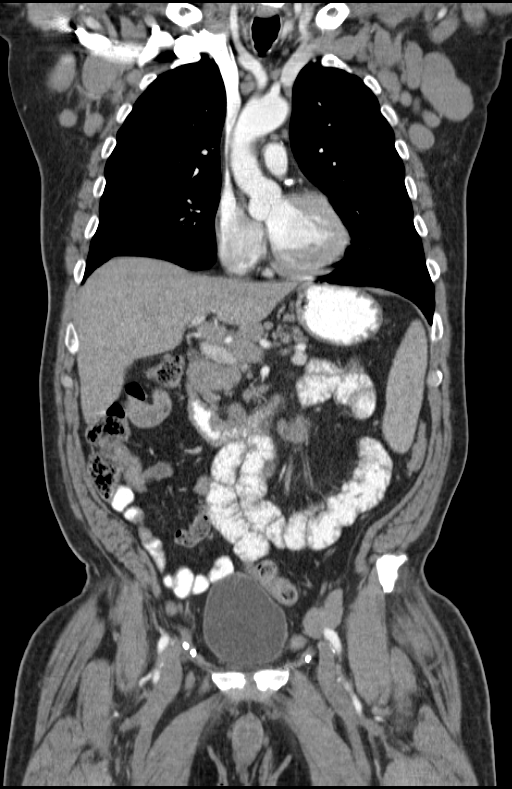
[im 96/172  soft-tissue]
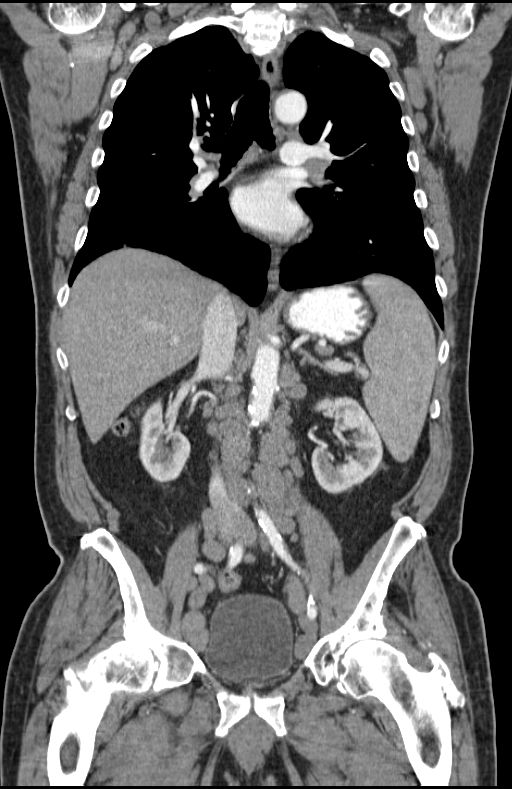

[13 of 46 positions shown; findings below may reference images not displayed]

FINDINGS: CT CHEST FINDINGS

Mediastinum: The heart size is normal. No pericardial effusion. The
trachea appears patent and is midline. Normal appearance of the
esophagus. Calcified atherosclerotic change involves the abdominal
aorta. The patient is status post CABG procedure.

Enlarged bilateral supraclavicular, axillary, mediastinal and hilar
lymph nodes are identified. Index right axillary lymph node measures
3.5 cm, image 12/series 2. Right supraclavicular lymph node measures
1.6 cm, image 4/series 2. Left axillary lymph node measures 2.9 cm,
image 16/series 2. Index right paratracheal lymph node measures
cm, image 22/series 2. The index right hilar lymph node measures
cm, image 27/series 2.

Lungs/Pleura: There is no pleural effusion identified. No airspace
consolidation or atelectasis. No suspicious pulmonary nodule or mass
noted.

Musculoskeletal: Multi level degenerative disc disease identified
within the thoracic spine. There is disc space narrowing and ventral
endplate spurring. No aggressive lytic or sclerotic bone lesion
identified.

CT ABDOMEN AND PELVIS FINDINGS

Hepatobiliary: Normal appearance of the liver. The patient is status
post cholecystectomy. No biliary dilatation.

Pancreas: Normal appearance of the pancreas.

Spleen: The spleen measures 16 cm in length and has a volume of 730
cc.

Adrenals/Urinary Tract: Normal appearance of the left adrenal gland.
There is a small nonspecific nodule in the right adrenal gland
measuring 9 mm, image number 61/series 2. The kidneys are on
unremarkable. Normal appearance of the urinary bladder.

Stomach/Bowel: The stomach appears normal. The small bowel loops
have a normal course and caliber without obstruction. Terminal ileum
and proximal colon are on unremarkable. Scattered sigmoid
diverticula noted without acute inflammation.

Vascular/Lymphatic: Calcified atherosclerotic disease involves the
abdominal aorta. The aorta measures 2.5 cm in maximum AP dimension.
Upper abdominal, retroperitoneal and pelvic adenopathy is
identified. Peripancreatic lymph node measures 3.6 cm, image
59/series 2. The portacaval lymph node measures 2.7 cm, image
64/series 2. Aortocaval lymph node measures 2.5 cm. Index periaortic
lymph node measures 2.3 cm. There is a right external iliac lymph
node measuring 2 cm, image 108/series 2. Left inguinal lymph node
measures 1.9 cm, image 112/series 2.

Reproductive: Prostate gland appears enlarged. Symmetric appearance
of the seminal vesicles.

Other: No free fluid or abnormal fluid collection identified.
Several soft tissue attenuating nodules along the undersurface of
the ventral abdominal wall are noted measuring up to 1.2 cm, image
68/series 2.

Musculoskeletal: Review of the visualized osseous structures is
significant for lumbar degenerative disc disease.
IMPRESSION: 1. Adenopathy is identified within the chest abdomen and pelvis
compatible with lymphoma.
2. Splenomegaly.
3. There are several soft tissue attenuating nodules along the
undersurface of the ventral abdominal wall which may indicate
peritoneal involvement by tumor.
4. Atherosclerotic disease. Ectatic abdominal aorta at risk for
aneurysm development. Recommend follow up by US in 5 years. This
recommendation follows ACR consensus guidelines: White Paper of the
ACR Incidental Findings Committee II on Vascular Findings. [HOSPITAL] [Z0]; [DATE].

## 2014-04-19 ENCOUNTER — Telehealth: Payer: Self-pay

## 2014-04-19 NOTE — Telephone Encounter (Signed)
CMA with the [facility] called, Dr. Ma Hillock needs clearance for a lymph node bx. Please advise regarding Eliquis.

## 2014-04-20 ENCOUNTER — Ambulatory Visit: Payer: Self-pay | Admitting: Internal Medicine

## 2014-04-20 NOTE — Telephone Encounter (Signed)
Dr. Beverly Gust office calling back w/ 2nd request for clearance and instructions regarding Eliquis.

## 2014-04-23 NOTE — Telephone Encounter (Signed)
Okay to hold eliquis for 3 days prior to the biopsy Would restart the anticoagulation 24 hours after procedure

## 2014-04-24 NOTE — Telephone Encounter (Signed)
Spoke w/ Josh @ [facility] Advised him of Dr. Donivan Scull recommendation.

## 2014-04-28 ENCOUNTER — Ambulatory Visit: Payer: Self-pay | Admitting: Internal Medicine

## 2014-04-28 IMAGING — US ULTRASOUND CORE BIOPSY
1 series · 13 of 14 positions shown · non-contrast
Comparison: none

CLINICAL DATA: 67-year-old male with marked multi station
adenopathy highly concerning for lymphoma. He presents for initial
ultrasound guided core biopsy to facilitate tissue diagnosis.

EXAM:
ULTRASOUND CORE BIOPSY
Date: [DATE]
PROCEDURE:
1. Ultrasound-guided core biopsy of right axillary lymphadenopathy
ANESTHESIA/SEDATION:
None
TECHNIQUE: Informed consent was obtained from the patient following explanation
of the procedure, risks, benefits and alternatives. The patient
understands, agrees and consents for the procedure. All questions
were addressed. A time out was performed.

[Series 1: ultrasound core biopsy · 0.10mm/px · 14 acquisitions, 13 frames shown]
[im 1/14]
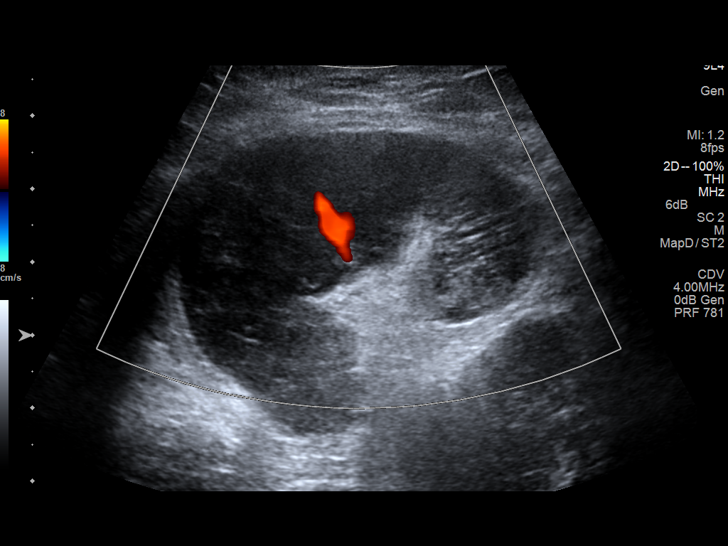
[im 2/14]
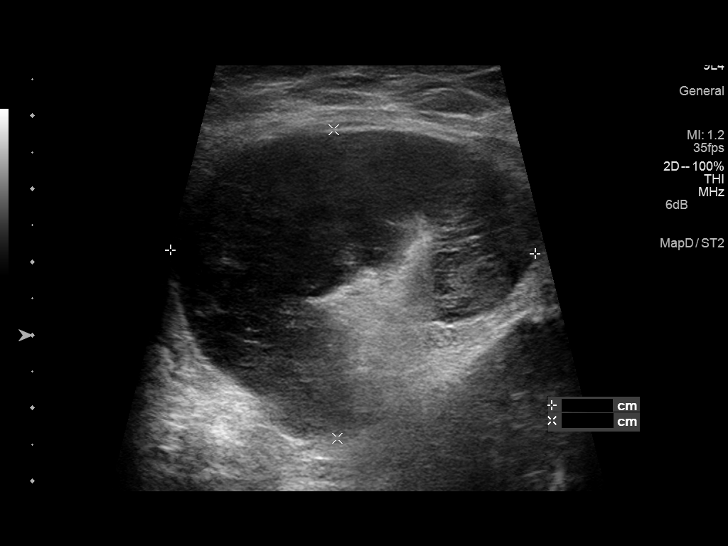
[im 3/14]
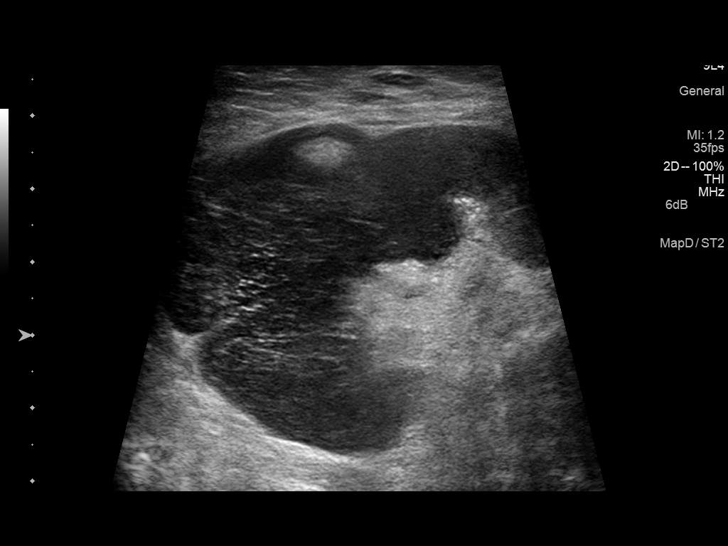
[im 4/14]
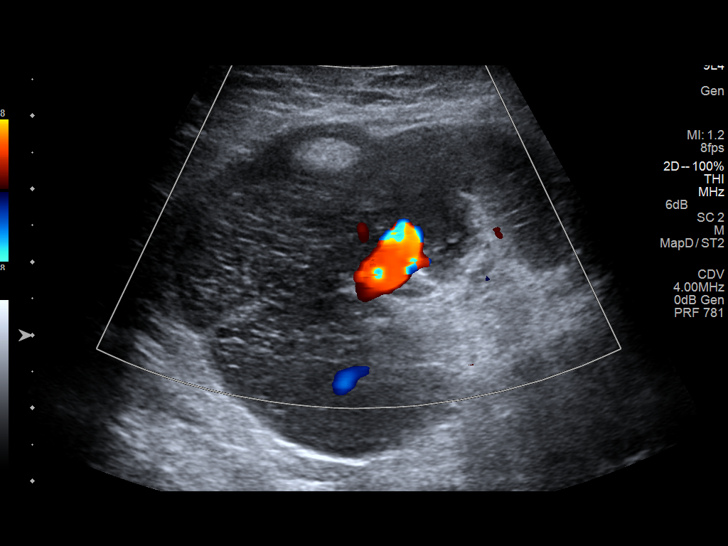
[im 5/14]
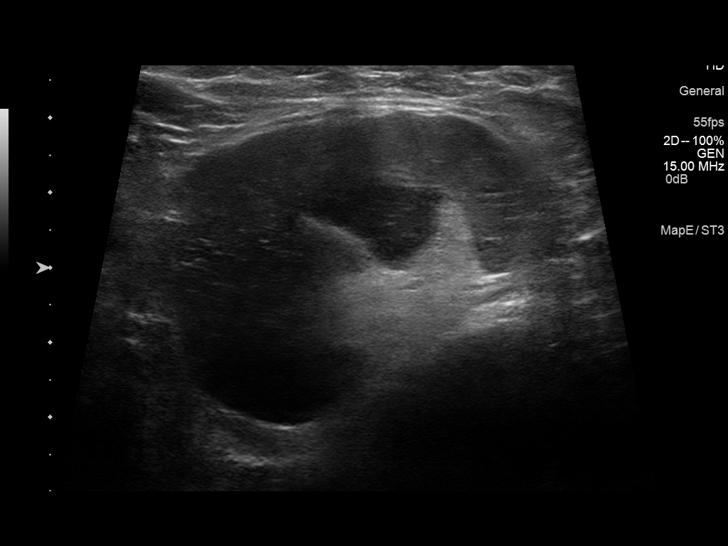
[im 6/14]
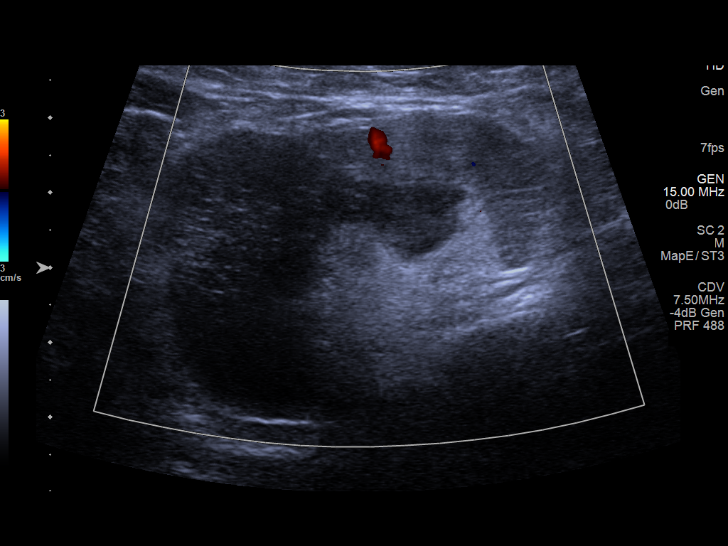
[im 8/14]
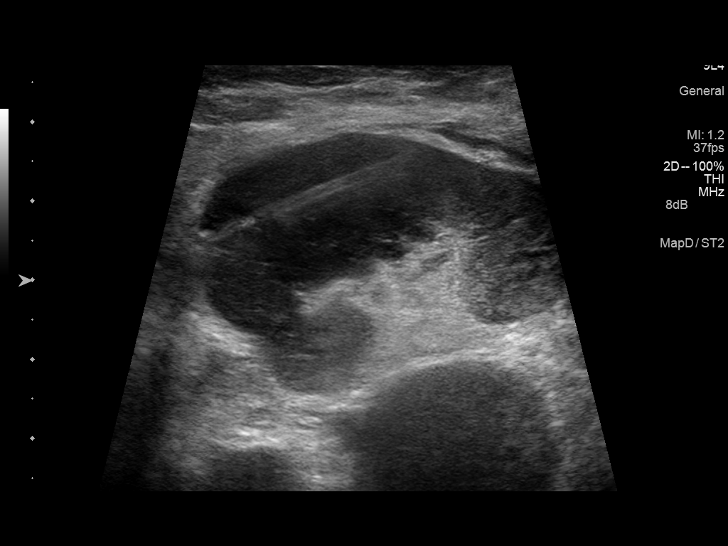
[im 9/14]
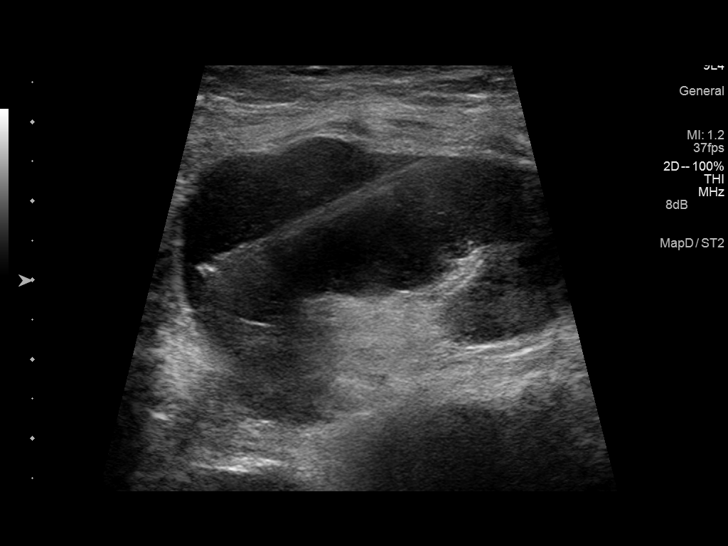
[im 10/14]
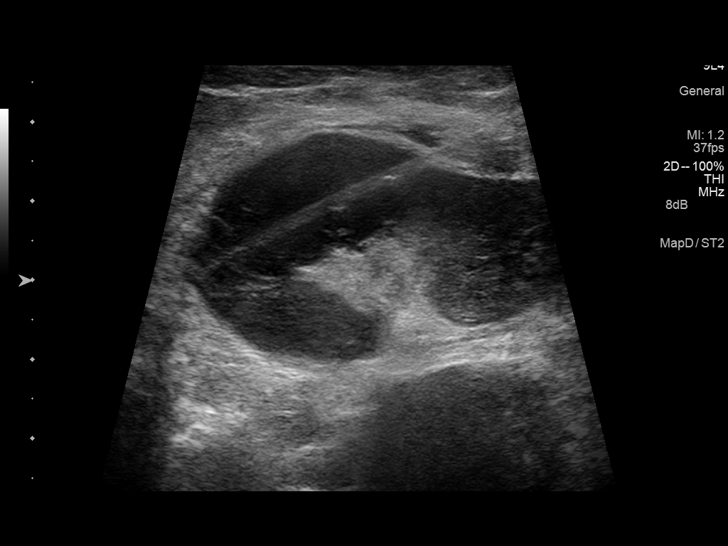
[im 11/14]
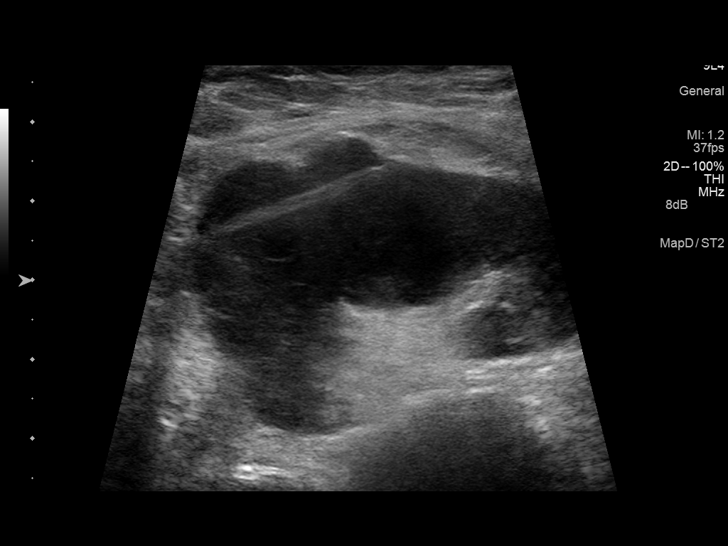
[im 12/14]
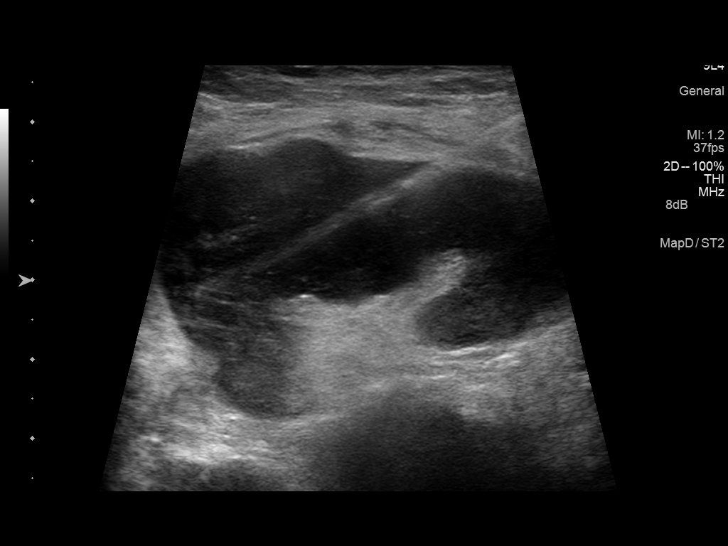
[im 13/14]
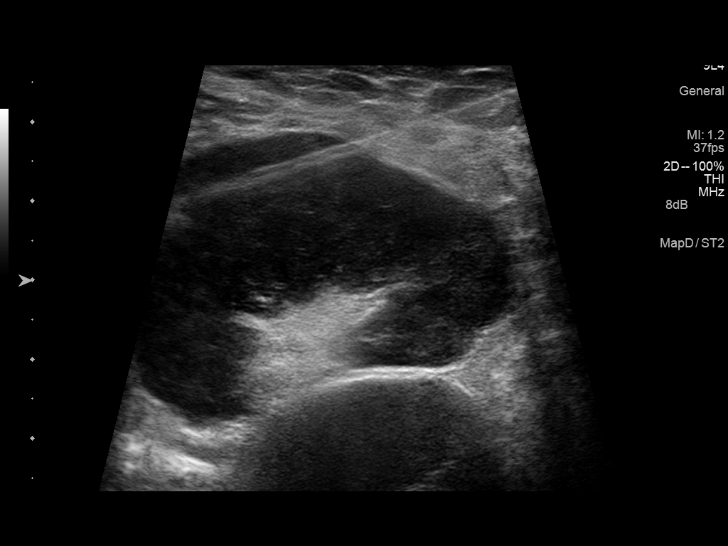
[im 14/14]
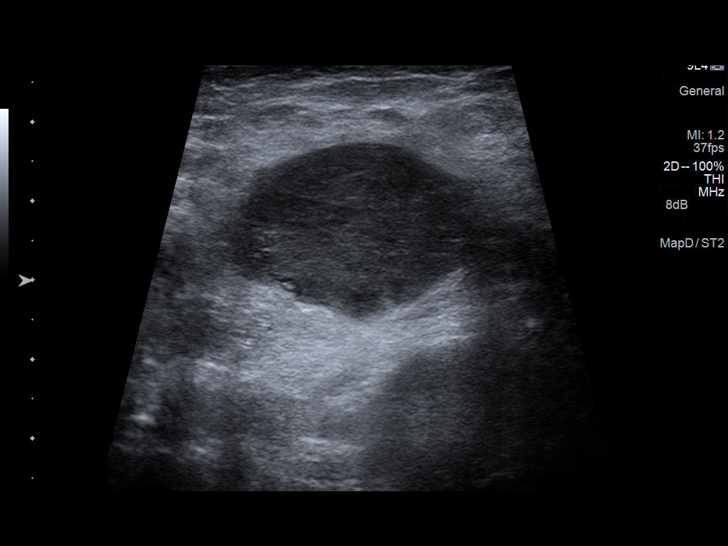

[13 of 14 positions shown; findings below may reference images not displayed]

The right axilla was interrogated with ultrasound. A markedly
enlarged hypoechoic palpable lymph node was successfully identified.
A suitable skin entry site was selected and marked. The right axilla
was then sterilely prepped and draped in the standard fashion using
chlorhexidine skin prep. Local anesthesia was attained by
infiltration with 1% lidocaine. A small dermatotomy was made. Under
real-time sonographic guidance, numerous 18 gauge core biopsies were
obtained using the KITSAKI automated biopsy device. The needle was
confirmed to be within the lymph node on all biopsy passes. The node
was in the upper, mid and lower portions as well as centrally near
the hilum and peripherally at the cortex.

Samples were divided into formalin and saline and sent to pathology
for further analysis. Hemostasis was attained by gentle manual
pressure. The patient tolerated the procedure well.

COMPLICATIONS:
None
IMPRESSION: Technically successful ultrasound-guided core biopsy of right
axillary lymphadenopathy.

## 2014-05-03 LAB — PATHOLOGY REPORT

## 2014-05-10 LAB — URIC ACID: Uric Acid: 5.1 mg/dL (ref 3.5–7.2)

## 2014-05-10 LAB — CBC CANCER CENTER
BASOS PCT: 0.5 %
Basophil #: 0.3 x10 3/mm — ABNORMAL HIGH (ref 0.0–0.1)
EOS ABS: 0.3 x10 3/mm (ref 0.0–0.7)
EOS PCT: 0.5 %
HCT: 41.1 % (ref 40.0–52.0)
HGB: 13.2 g/dL (ref 13.0–18.0)
LYMPHS PCT: 89.8 %
Lymphocyte #: 59.2 x10 3/mm — ABNORMAL HIGH (ref 1.0–3.6)
MCH: 32 pg (ref 26.0–34.0)
MCHC: 32.2 g/dL (ref 32.0–36.0)
MCV: 100 fL (ref 80–100)
Monocyte #: 0.9 x10 3/mm (ref 0.2–1.0)
Monocyte %: 1.4 %
Neutrophil #: 5.1 x10 3/mm (ref 1.4–6.5)
Neutrophil %: 7.8 %
Platelet: 106 x10 3/mm — ABNORMAL LOW (ref 150–440)
RBC: 4.13 10*6/uL — ABNORMAL LOW (ref 4.40–5.90)
RDW: 14.6 % — ABNORMAL HIGH (ref 11.5–14.5)
WBC: 65.9 x10 3/mm — AB (ref 3.8–10.6)

## 2014-05-10 LAB — HEPATIC FUNCTION PANEL A (ARMC)
ALT: 33 U/L
Albumin: 4 g/dL (ref 3.4–5.0)
Alkaline Phosphatase: 83 U/L
Bilirubin, Direct: 0.1 mg/dL (ref 0.0–0.2)
Bilirubin,Total: 0.6 mg/dL (ref 0.2–1.0)
SGOT(AST): 17 U/L (ref 15–37)
Total Protein: 7.3 g/dL (ref 6.4–8.2)

## 2014-05-10 LAB — CREATININE, SERUM
Creatinine: 1.07 mg/dL (ref 0.60–1.30)
EGFR (Non-African Amer.): >60

## 2014-05-17 LAB — CBC CANCER CENTER
Basophil #: 0.2 x10 3/mm — ABNORMAL HIGH (ref 0.0–0.1)
Basophil %: 0.5 %
Eosinophil #: 0.3 x10 3/mm (ref 0.0–0.7)
Eosinophil %: 0.7 %
HCT: 39.9 % — AB (ref 40.0–52.0)
HGB: 12.8 g/dL — AB (ref 13.0–18.0)
Lymphocyte #: 42.5 x10 3/mm — ABNORMAL HIGH (ref 1.0–3.6)
Lymphocyte %: 87 %
MCH: 31.9 pg (ref 26.0–34.0)
MCHC: 32.2 g/dL (ref 32.0–36.0)
MCV: 99 fL (ref 80–100)
Monocyte #: 1.1 x10 3/mm — ABNORMAL HIGH (ref 0.2–1.0)
Monocyte %: 2.2 %
NEUTROS PCT: 9.6 %
Neutrophil #: 4.7 x10 3/mm (ref 1.4–6.5)
Platelet: 146 x10 3/mm — ABNORMAL LOW (ref 150–440)
RBC: 4.02 10*6/uL — ABNORMAL LOW (ref 4.40–5.90)
RDW: 15.1 % — AB (ref 11.5–14.5)
WBC: 48.9 x10 3/mm — ABNORMAL HIGH (ref 3.8–10.6)

## 2014-05-17 LAB — URIC ACID: URIC ACID: 5.1 mg/dL (ref 3.5–7.2)

## 2014-05-17 LAB — CREATININE, SERUM
Creatinine: 0.85 mg/dL (ref 0.60–1.30)
EGFR (Non-African Amer.): >60

## 2014-05-21 ENCOUNTER — Ambulatory Visit: Payer: Self-pay | Admitting: Internal Medicine

## 2014-05-24 LAB — BASIC METABOLIC PANEL
ANION GAP: 7 (ref 7–16)
BUN: 12 mg/dL (ref 7–18)
CHLORIDE: 104 mmol/L (ref 98–107)
CREATININE: 0.93 mg/dL (ref 0.60–1.30)
Calcium, Total: 9 mg/dL (ref 8.5–10.1)
Co2: 28 mmol/L (ref 21–32)
EGFR (Non-African Amer.): >60
Glucose: 126 mg/dL — ABNORMAL HIGH (ref 65–99)
Osmolality: 279 (ref 275–301)
Potassium: 4.7 mmol/L (ref 3.5–5.1)
Sodium: 139 mmol/L (ref 136–145)

## 2014-05-24 LAB — CBC CANCER CENTER
BASOS ABS: 0.3 x10 3/mm — AB (ref 0.0–0.1)
Basophil %: 0.9 %
Eosinophil #: 0.2 x10 3/mm (ref 0.0–0.7)
Eosinophil %: 0.6 %
HCT: 37.9 % — ABNORMAL LOW (ref 40.0–52.0)
HGB: 12.4 g/dL — AB (ref 13.0–18.0)
LYMPHS PCT: 80.6 %
Lymphocyte #: 26.7 x10 3/mm — ABNORMAL HIGH (ref 1.0–3.6)
MCH: 32.2 pg (ref 26.0–34.0)
MCHC: 32.8 g/dL (ref 32.0–36.0)
MCV: 98 fL (ref 80–100)
MONO ABS: 0.7 x10 3/mm (ref 0.2–1.0)
Monocyte %: 2.1 %
NEUTROS PCT: 15.8 %
Neutrophil #: 5.2 x10 3/mm (ref 1.4–6.5)
PLATELETS: 140 x10 3/mm — AB (ref 150–440)
RBC: 3.86 10*6/uL — AB (ref 4.40–5.90)
RDW: 14.5 % (ref 11.5–14.5)
WBC: 33.1 x10 3/mm — ABNORMAL HIGH (ref 3.8–10.6)

## 2014-05-24 LAB — HEPATIC FUNCTION PANEL A (ARMC)
ALBUMIN: 3.8 g/dL (ref 3.4–5.0)
AST: 16 U/L (ref 15–37)
Alkaline Phosphatase: 85 U/L
Bilirubin, Direct: 0.2 mg/dL (ref 0.0–0.2)
Bilirubin,Total: 1.1 mg/dL — ABNORMAL HIGH (ref 0.2–1.0)
SGPT (ALT): 26 U/L
TOTAL PROTEIN: 6.9 g/dL (ref 6.4–8.2)

## 2014-05-27 ENCOUNTER — Telehealth: Payer: Self-pay

## 2014-05-27 NOTE — Telephone Encounter (Signed)
LVM with pt to call back.    RE: scheduling AWV for 2015.

## 2014-05-31 LAB — CBC CANCER CENTER
Basophil #: 0.1 x10 3/mm (ref 0.0–0.1)
Basophil %: 0.7 %
EOS ABS: 0.3 x10 3/mm (ref 0.0–0.7)
EOS PCT: 1.4 %
HCT: 36.3 % — ABNORMAL LOW (ref 40.0–52.0)
HGB: 11.9 g/dL — ABNORMAL LOW (ref 13.0–18.0)
LYMPHS ABS: 15.3 x10 3/mm — AB (ref 1.0–3.6)
Lymphocyte %: 75.3 %
MCH: 32.2 pg (ref 26.0–34.0)
MCHC: 32.7 g/dL (ref 32.0–36.0)
MCV: 99 fL (ref 80–100)
MONO ABS: 0.6 x10 3/mm (ref 0.2–1.0)
Monocyte %: 3 %
NEUTROS ABS: 4 x10 3/mm (ref 1.4–6.5)
NEUTROS PCT: 19.6 %
Platelet: 119 x10 3/mm — ABNORMAL LOW (ref 150–440)
RBC: 3.69 10*6/uL — ABNORMAL LOW (ref 4.40–5.90)
RDW: 14.6 % — ABNORMAL HIGH (ref 11.5–14.5)
WBC: 20.3 x10 3/mm — ABNORMAL HIGH (ref 3.8–10.6)

## 2014-05-31 LAB — CREATININE, SERUM
CREATININE: 0.85 mg/dL (ref 0.60–1.30)
EGFR (African American): >60

## 2014-05-31 LAB — URIC ACID: Uric Acid: 4.1 mg/dL (ref 3.5–7.2)

## 2014-06-07 LAB — CBC CANCER CENTER
BASOS ABS: 0.1 x10 3/mm (ref 0.0–0.1)
Basophil %: 0.8 %
EOS ABS: 0.4 x10 3/mm (ref 0.0–0.7)
EOS PCT: 2.6 %
HCT: 36.8 % — AB (ref 40.0–52.0)
HGB: 12.3 g/dL — ABNORMAL LOW (ref 13.0–18.0)
Lymphocyte #: 10.6 x10 3/mm — ABNORMAL HIGH (ref 1.0–3.6)
Lymphocyte %: 66.7 %
MCH: 32.8 pg (ref 26.0–34.0)
MCHC: 33.4 g/dL (ref 32.0–36.0)
MCV: 98 fL (ref 80–100)
Monocyte #: 0.7 x10 3/mm (ref 0.2–1.0)
Monocyte %: 4.5 %
Neutrophil #: 4 x10 3/mm (ref 1.4–6.5)
Neutrophil %: 25.4 %
PLATELETS: 123 x10 3/mm — AB (ref 150–440)
RBC: 3.75 10*6/uL — ABNORMAL LOW (ref 4.40–5.90)
RDW: 15.1 % — AB (ref 11.5–14.5)
WBC: 15.8 x10 3/mm — ABNORMAL HIGH (ref 3.8–10.6)

## 2014-06-14 LAB — CBC CANCER CENTER
BASOS ABS: 0.1 x10 3/mm (ref 0.0–0.1)
BASOS PCT: 0.9 %
Eosinophil #: 0.2 x10 3/mm (ref 0.0–0.7)
Eosinophil %: 2 %
HCT: 38.3 % — ABNORMAL LOW (ref 40.0–52.0)
HGB: 12.8 g/dL — ABNORMAL LOW (ref 13.0–18.0)
LYMPHS ABS: 4.1 x10 3/mm — AB (ref 1.0–3.6)
Lymphocyte %: 45.3 %
MCH: 33 pg (ref 26.0–34.0)
MCHC: 33.3 g/dL (ref 32.0–36.0)
MCV: 99 fL (ref 80–100)
MONOS PCT: 6.1 %
Monocyte #: 0.6 x10 3/mm (ref 0.2–1.0)
NEUTROS ABS: 4.2 x10 3/mm (ref 1.4–6.5)
Neutrophil %: 45.7 %
Platelet: 165 x10 3/mm (ref 150–440)
RBC: 3.88 10*6/uL — AB (ref 4.40–5.90)
RDW: 15.5 % — AB (ref 11.5–14.5)
WBC: 9.1 x10 3/mm (ref 3.8–10.6)

## 2014-06-14 LAB — HEPATIC FUNCTION PANEL A (ARMC)
ALK PHOS: 76 U/L
ALT: 31 U/L
AST: 12 U/L — AB (ref 15–37)
Albumin: 3.7 g/dL (ref 3.4–5.0)
BILIRUBIN TOTAL: 1.3 mg/dL — AB (ref 0.2–1.0)
Bilirubin, Direct: 0.3 mg/dL — ABNORMAL HIGH (ref 0.0–0.2)
Total Protein: 6.6 g/dL (ref 6.4–8.2)

## 2014-06-14 LAB — URIC ACID: Uric Acid: 5.2 mg/dL (ref 3.5–7.2)

## 2014-06-19 ENCOUNTER — Encounter: Payer: Self-pay | Admitting: Cardiovascular Disease

## 2014-06-19 ENCOUNTER — Ambulatory Visit (INDEPENDENT_AMBULATORY_CARE_PROVIDER_SITE_OTHER): Payer: Medicare Other | Admitting: Cardiovascular Disease

## 2014-06-19 VITALS — BP 118/54 | HR 63 | Ht 69.0 in | Wt 212.5 lb

## 2014-06-19 DIAGNOSIS — C9110 Chronic lymphocytic leukemia of B-cell type not having achieved remission: Secondary | ICD-10-CM

## 2014-06-19 DIAGNOSIS — I4891 Unspecified atrial fibrillation: Secondary | ICD-10-CM

## 2014-06-19 DIAGNOSIS — R0602 Shortness of breath: Secondary | ICD-10-CM

## 2014-06-19 DIAGNOSIS — E7800 Pure hypercholesterolemia, unspecified: Secondary | ICD-10-CM

## 2014-06-19 DIAGNOSIS — I25810 Atherosclerosis of coronary artery bypass graft(s) without angina pectoris: Secondary | ICD-10-CM

## 2014-06-19 DIAGNOSIS — I10 Essential (primary) hypertension: Secondary | ICD-10-CM

## 2014-06-19 NOTE — Assessment & Plan Note (Signed)
No recent episodes of atrial fibrillation. Tolerating anticoagulation. We'll continue metoprolol Asymptomatic PVCs noted

## 2014-06-19 NOTE — Patient Instructions (Signed)
You are doing well. No medication changes were made.  Please call us if you have new issues that need to be addressed before your next appt.  Your physician wants you to follow-up in: 6 months.  You will receive a reminder letter in the mail two months in advance. If you don't receive a letter, please call our office to schedule the follow-up appointment.   

## 2014-06-19 NOTE — Assessment & Plan Note (Signed)
He denies having any significant shortness of breath, overall is stable

## 2014-06-19 NOTE — Progress Notes (Signed)
Patient ID: [name], male    DOB: 1946/01/24, 68 y.o.   MRN: [id number]  HPI Comments: [name] is a 68 year old gentleman with a history of smoking, coronary artery disease, occluded LAD in the mid vessel, 60% left main, 99% distal RCA who was sent to [name] in October 2010 for bypass surgery. He received 3 vessel bypass by Dr.Gerhardt with a LIMA to the LAD, vein graft to the OM1 and vein graft to the PDA, with subsequent cardiac catheter March 2011 for chest discomfort showing occluded vein grafts x2 with patent LIMA. He has collateral vessels from left to right. Ejection fraction 55% mild anterolateral hypokinesis. He Smoked for 40 years, quit in 2009 He presents today for follow-up of his coronary artery disease  In general he reports feeling well, denies having any angina. Recent diagnosis of CLL, followed by Dr. Ma Hillock. He has had several rounds of chemotherapy, has 2 more rounds of chemotherapy remaining followed by a CT scan for routine surveillance He denies having any tachycardia concerning for arrhythmia or atrial fibrillation. He reports his weight has been decreasing, previously 226 pounds, now 212 pounds. 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   retired, taking care of goats, rabbits, chickens.  recently built a barn, tool shed, also a Camera operator.  Recent lab work showing total cholesterol 137, LDL 86, earlier in the year  EKG today showing normal sinus rhythm with rate 63 bpm, frequent PVCs, nonspecific ST abnormality    Other past medical history On prior visits he reported having chronic leg pain since he was young.  Previous leg pain with Crestor   admitted to the hospital on September 26 2011 for malaise, appearing pale, irregular heart rhythm and noted to be in atrial fibrillation. He was given medication for rhythm control and he converted to  normal sinus rhythm later that evening. No longer on anticoagulation. He has been maintaining normal sinus rhythm No symptoms concerning for atrial fibrillation since that time  Echocardiogram showed ejection fraction 35-45%, mildly elevated right ventricular systolic pressures estimated at 30-40 mm mercury.   Outpatient Encounter Prescriptions as of 06/19/2014  Medication Sig  . amLODipine (NORVASC) 10 MG tablet Take 0.5 tablets (5 mg total) by mouth daily.  Marland Kitchen apixaban (ELIQUIS) 5 MG TABS tablet Take 1 tablet (5 mg total) by mouth 2 (two) times daily.  Marland Kitchen atorvastatin (LIPITOR) 80 MG tablet Take 0.5 tablets (40 mg total) by mouth daily.  . budesonide-formoterol (SYMBICORT) 160-4.5 MCG/ACT inhaler Inhale 2 puffs into the lungs 2 (two) times daily as needed.  . Coenzyme Q10 (CO Q-10) 100 MG CAPS Take 100 mg by mouth daily.  . metoprolol tartrate (LOPRESSOR) 25 MG tablet Take 1 tablet (25 mg total) by mouth 2 (two) times daily.  . Multiple Vitamin (MULTI VITAMIN MENS PO) Take 1 tablet by mouth daily.  . nitroGLYCERIN (NITROSTAT) 0.4 MG SL tablet Place 0.4 mg under the tongue every 5 (five) minutes as needed.  . promethazine (PHENERGAN) 25 MG tablet Take 25 mg by mouth every 6 (six) hours as needed for nausea or vomiting.  . tiotropium (SPIRIVA HANDIHALER) 18 MCG inhalation capsule Place 1 capsule (18 mcg total) into inhaler and inhale daily.  . traMADol (ULTRAM) 50 MG tablet Take 50 mg by mouth every 6 (six) hours as needed.   . [DISCONTINUED] allopurinol (ZYLOPRIM)  100 MG tablet      Past Medical History  Diagnosis Date  . Coronary artery disease     a. 04/2009 CABG x 3 (LIMA->LAD, VG->OM1, VG->PDA); b. 09/2009 Caht: occluded VG x 2 w/ patent LIMA and L->R collats. EF 55%, mild antlat HK; c. 10/2011 MV: EF 53%, no isch/infarct-->low risk.  . Pure hypercholesterolemia   . Hypertension   . Atrial fibrillation     a. Dx 2013, recurred 02/2014, CHA2DS2VASc = 3 -->placed on  Eliquis; b. 02/2014 Echo: EF 50-55%, mid and apical anterior septum and mid and apical inf septum are abnl, mild to mod Ao sclerosis w/o AS.  Marland Kitchen History of arthritis   . Arthritis   . Chicken pox   . Rheumatic fever   . COPD (chronic obstructive pulmonary disease)   . Arthritis   . Chronic lymphocytic leukemia     a. Dx 02/2014.   Social history  reports that he quit smoking about 7 years ago. His smoking use included Cigarettes. He has a 40 pack-year smoking history. He has never used smokeless tobacco. He reports that he drinks about 0.5 oz of alcohol per week. He reports that he does not use illicit drugs.   Review of Systems  Constitutional: Negative.   Eyes: Negative.   Respiratory: Negative.   Cardiovascular: Negative.   Gastrointestinal: Negative.   Endocrine: Negative.   Musculoskeletal: Positive for arthralgias and gait problem.  Neurological: Negative.   Psychiatric/Behavioral: Negative.   All other systems reviewed and are negative.  BP 118/54 mmHg  Pulse 63  Ht 5\' 9"  (1.753 m)  Wt 212 lb 8 oz (96.389 kg)  BMI 31.37 kg/m2  Physical Exam  Constitutional: He is oriented to person, place, and time. He appears well-developed and well-nourished.  HENT:  Head: Normocephalic.  Nose: Nose normal.  Mouth/Throat: Oropharynx is clear and moist.  Eyes: Conjunctivae are normal. Pupils are equal, round, and reactive to light.  Neck: Normal range of motion. Neck supple. No JVD present.  Cardiovascular: Normal rate, regular rhythm, S1 normal, S2 normal, normal heart sounds and intact distal pulses.  Exam reveals no gallop and no friction rub.   No murmur heard. Pulmonary/Chest: Effort normal and breath sounds normal. No respiratory distress. He has no wheezes. He has no rales. He exhibits no tenderness.  Abdominal: Soft. Bowel sounds are normal. He exhibits no distension. There is no tenderness.  Musculoskeletal: Normal range of motion. He exhibits no  edema or tenderness.  Lymphadenopathy:    He has no cervical adenopathy.  Neurological: He is alert and oriented to person, place, and time. Coordination normal.  Skin: Skin is warm and dry. No rash noted. No erythema.  Psychiatric: He has a normal mood and affect. His behavior is normal. Judgment and thought content normal.      Assessment and Plan   Nursing note and vitals reviewed.

## 2014-06-19 NOTE — Assessment & Plan Note (Signed)
Undergoing treatment with Dr. Ma Hillock, 2 more rounds of chemotherapy remaining

## 2014-06-19 NOTE — Assessment & Plan Note (Signed)
Tolerating his statin. Recent weight loss. Cholesterol likely at goal

## 2014-06-19 NOTE — Assessment & Plan Note (Signed)
Currently with no symptoms of angina. No further workup at this time. Continue current medication regimen. 

## 2014-06-19 NOTE — Assessment & Plan Note (Signed)
Blood pressure is well controlled on today's visit. No changes made to the medications. 

## 2014-06-20 ENCOUNTER — Ambulatory Visit: Payer: Self-pay | Admitting: Internal Medicine

## 2014-06-21 LAB — CBC CANCER CENTER
BASOS ABS: 0.1 x10 3/mm (ref 0.0–0.1)
BASOS PCT: 1 %
EOS PCT: 2.3 %
Eosinophil #: 0.2 x10 3/mm (ref 0.0–0.7)
HCT: 37.4 % — ABNORMAL LOW (ref 40.0–52.0)
HGB: 12.5 g/dL — ABNORMAL LOW (ref 13.0–18.0)
Lymphocyte #: 2.5 x10 3/mm (ref 1.0–3.6)
Lymphocyte %: 29.7 %
MCH: 32.7 pg (ref 26.0–34.0)
MCHC: 33.5 g/dL (ref 32.0–36.0)
MCV: 98 fL (ref 80–100)
MONO ABS: 0.7 x10 3/mm (ref 0.2–1.0)
MONOS PCT: 8.4 %
Neutrophil #: 4.9 x10 3/mm (ref 1.4–6.5)
Neutrophil %: 58.6 %
PLATELETS: 156 x10 3/mm (ref 150–440)
RBC: 3.84 10*6/uL — ABNORMAL LOW (ref 4.40–5.90)
RDW: 16 % — AB (ref 11.5–14.5)
WBC: 8.3 x10 3/mm (ref 3.8–10.6)

## 2014-06-21 LAB — BASIC METABOLIC PANEL
Anion Gap: 6 — ABNORMAL LOW (ref 7–16)
BUN: 12 mg/dL (ref 7–18)
CREATININE: 0.89 mg/dL (ref 0.60–1.30)
Calcium, Total: 8.7 mg/dL (ref 8.5–10.1)
Chloride: 106 mmol/L (ref 98–107)
Co2: 30 mmol/L (ref 21–32)
EGFR (African American): >60
EGFR (Non-African Amer.): >60
Glucose: 113 mg/dL — ABNORMAL HIGH (ref 65–99)
OSMOLALITY: 284 (ref 275–301)
Potassium: 3.7 mmol/L (ref 3.5–5.1)
SODIUM: 142 mmol/L (ref 136–145)

## 2014-06-21 LAB — HEPATIC FUNCTION PANEL A (ARMC)
ALK PHOS: 81 U/L
Albumin: 3.7 g/dL (ref 3.4–5.0)
BILIRUBIN TOTAL: 0.9 mg/dL (ref 0.2–1.0)
Bilirubin, Direct: 0.2 mg/dL (ref 0.0–0.2)
SGOT(AST): 14 U/L — ABNORMAL LOW (ref 15–37)
SGPT (ALT): 28 U/L
TOTAL PROTEIN: 6.6 g/dL (ref 6.4–8.2)

## 2014-06-28 LAB — CBC CANCER CENTER
BASOS ABS: 0.1 x10 3/mm (ref 0.0–0.1)
Basophil %: 1.1 %
EOS ABS: 0.2 x10 3/mm (ref 0.0–0.7)
Eosinophil %: 2.4 %
HCT: 36.9 % — ABNORMAL LOW (ref 40.0–52.0)
HGB: 12.3 g/dL — AB (ref 13.0–18.0)
LYMPHS ABS: 2.3 x10 3/mm (ref 1.0–3.6)
LYMPHS PCT: 33.5 %
MCH: 32.2 pg (ref 26.0–34.0)
MCHC: 33.5 g/dL (ref 32.0–36.0)
MCV: 96 fL (ref 80–100)
MONO ABS: 0.8 x10 3/mm (ref 0.2–1.0)
MONOS PCT: 11.6 %
NEUTROS ABS: 3.5 x10 3/mm (ref 1.4–6.5)
Neutrophil %: 51.4 %
Platelet: 135 x10 3/mm — ABNORMAL LOW (ref 150–440)
RBC: 3.84 10*6/uL — ABNORMAL LOW (ref 4.40–5.90)
RDW: 15.4 % — ABNORMAL HIGH (ref 11.5–14.5)
WBC: 6.7 x10 3/mm (ref 3.8–10.6)

## 2014-07-04 LAB — URIC ACID: Uric Acid: 5.1 mg/dL (ref 3.5–7.2)

## 2014-07-04 LAB — CREATININE, SERUM
CREATININE: 0.94 mg/dL (ref 0.60–1.30)
EGFR (African American): >60
EGFR (Non-African Amer.): >60

## 2014-07-04 LAB — CBC CANCER CENTER
Basophil #: 0.1 x10 3/mm (ref 0.0–0.1)
Basophil %: 1.1 %
EOS ABS: 0.2 x10 3/mm (ref 0.0–0.7)
EOS PCT: 3.2 %
HCT: 37.4 % — AB (ref 40.0–52.0)
HGB: 12.6 g/dL — AB (ref 13.0–18.0)
Lymphocyte #: 2.4 x10 3/mm (ref 1.0–3.6)
Lymphocyte %: 33.7 %
MCH: 32.4 pg (ref 26.0–34.0)
MCHC: 33.7 g/dL (ref 32.0–36.0)
MCV: 96 fL (ref 80–100)
Monocyte #: 0.7 x10 3/mm (ref 0.2–1.0)
Monocyte %: 10.3 %
NEUTROS ABS: 3.7 x10 3/mm (ref 1.4–6.5)
NEUTROS PCT: 51.7 %
PLATELETS: 154 x10 3/mm (ref 150–440)
RBC: 3.89 10*6/uL — ABNORMAL LOW (ref 4.40–5.90)
RDW: 15.5 % — ABNORMAL HIGH (ref 11.5–14.5)
WBC: 7.2 x10 3/mm (ref 3.8–10.6)

## 2014-07-06 ENCOUNTER — Encounter: Payer: Self-pay | Admitting: *Deleted

## 2014-07-06 ENCOUNTER — Encounter: Payer: Self-pay | Admitting: Internal Medicine

## 2014-07-06 ENCOUNTER — Ambulatory Visit (INDEPENDENT_AMBULATORY_CARE_PROVIDER_SITE_OTHER): Payer: Medicare Other | Admitting: Internal Medicine

## 2014-07-06 VITALS — BP 122/72 | HR 61 | Temp 98.3°F | Ht 69.0 in | Wt 217.0 lb

## 2014-07-06 DIAGNOSIS — Z0000 Encounter for general adult medical examination without abnormal findings: Secondary | ICD-10-CM

## 2014-07-06 DIAGNOSIS — E7800 Pure hypercholesterolemia, unspecified: Secondary | ICD-10-CM

## 2014-07-06 DIAGNOSIS — C9110 Chronic lymphocytic leukemia of B-cell type not having achieved remission: Secondary | ICD-10-CM

## 2014-07-06 LAB — COMPREHENSIVE METABOLIC PANEL
ALK PHOS: 64 U/L (ref 39–117)
ALT: 21 U/L (ref 0–53)
AST: 15 U/L (ref 0–37)
Albumin: 4 g/dL (ref 3.5–5.2)
BILIRUBIN TOTAL: 0.8 mg/dL (ref 0.2–1.2)
BUN: 13 mg/dL (ref 6–23)
CO2: 24 mEq/L (ref 19–32)
CREATININE: 0.8 mg/dL (ref 0.4–1.5)
Calcium: 8.9 mg/dL (ref 8.4–10.5)
Chloride: 108 mEq/L (ref 96–112)
GFR: 99 mL/min (ref >60.00)
Glucose, Bld: 123 mg/dL — ABNORMAL HIGH (ref 70–99)
Potassium: 4.5 mEq/L (ref 3.5–5.1)
Sodium: 141 mEq/L (ref 135–145)
Total Protein: 6.2 g/dL (ref 6.0–8.3)

## 2014-07-06 LAB — CBC WITH DIFFERENTIAL/PLATELET
BASOS ABS: 0 10*3/uL (ref 0.0–0.1)
BASOS PCT: 0.2 % (ref 0.0–3.0)
EOS ABS: 0 10*3/uL (ref 0.0–0.7)
Eosinophils Relative: 0.1 % (ref 0.0–5.0)
HCT: 34.8 % — ABNORMAL LOW (ref 39.0–52.0)
HEMOGLOBIN: 11.5 g/dL — AB (ref 13.0–17.0)
LYMPHS ABS: 2.5 10*3/uL (ref 0.7–4.0)
Lymphocytes Relative: 15.9 % (ref 12.0–46.0)
MCHC: 32.9 g/dL (ref 30.0–36.0)
MCV: 98 fl (ref 78.0–100.0)
MONO ABS: 1.3 10*3/uL — AB (ref 0.1–1.0)
Monocytes Relative: 8.2 % (ref 3.0–12.0)
NEUTROS ABS: 12 10*3/uL — AB (ref 1.4–7.7)
Neutrophils Relative %: 75.6 % (ref 43.0–77.0)
Platelets: 162 10*3/uL (ref 150.0–400.0)
RBC: 3.56 Mil/uL — AB (ref 4.22–5.81)
RDW: 16.5 % — AB (ref 11.5–15.5)
WBC: 15.9 10*3/uL — ABNORMAL HIGH (ref 4.0–10.5)

## 2014-07-06 LAB — LIPID PANEL
CHOL/HDL RATIO: 4
Cholesterol: 145 mg/dL (ref 0–200)
HDL: 36.8 mg/dL — AB (ref >39.00)
LDL Cholesterol: 91 mg/dL (ref 0–99)
NONHDL: 108.2
Triglycerides: 84 mg/dL (ref 0.0–149.0)
VLDL: 16.8 mg/dL (ref 0.0–40.0)

## 2014-07-06 LAB — PSA, MEDICARE: PSA: 1.49 ng/ml (ref 0.10–4.00)

## 2014-07-06 NOTE — Assessment & Plan Note (Signed)
Reports he has recently been receiving IV chemotherapy. Will request records from oncology.

## 2014-07-06 NOTE — Progress Notes (Signed)
Subjective:    Patient ID: [name], male    DOB: Jan 08, 1946, 68 y.o.   MRN: [id number]  HPI The patient is here for annual Medicare wellness examination and management of other chronic and acute problems.   The risk factors are reflected in the social history.  The roster of all physicians providing medical care to patient - is listed in the Snapshot section of the chart.  Activities of daily living:  The patient is 100% independent in all ADLs: dressing, toileting, feeding as well as independent mobility. Lives in a home with wife. One story home.  Home safety : The patient has smoke detectors in the home. They wear seatbelts. There are no firearms at home. There is no violence in the home.   There is no risks for hepatitis, STDs or HIV. There is no history of blood transfusion. They have traveled to [place] and had malaria in the past (served in [place]).  The patient has not seen their dentist in the last six month. Has an upper denture. Does not have dental insurance. (Dr. Eulas Post) They have seen their eye doctor in the last year. ([facility])  Hearing aids in place. They have deferred audiologic testing in the last year.  They do not  have excessive sun exposure. Discussed the need for sun protection: hats, long sleeves and use of sunscreen if there is significant sun exposure.  Oncologist - Dr. Ma Hillock  Diet: the importance of a healthy diet is discussed. They do have a healthy diet.  The benefits of regular aerobic exercise were discussed. 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  Depression screen: there are no signs or vegative symptoms of depression- irritability, change in appetite, anhedonia, sadness/tearfullness.  Cognitive assessment: the patient manages all their financial and personal affairs and is actively engaged. They could relate day,date,year and events.  The following portions of the patient's history were reviewed and updated as appropriate:  allergies, current medications, past family history, past medical history,  past surgical history, past social history  and problem list.  Visual acuity was not assessed per patient preference since he has regular follow up with his ophthalmologist. Hearing and body mass index were assessed and reviewed.   During the course of the visit the patient was educated and counseled about appropriate screening and preventive services including : fall prevention , diabetes screening, nutrition counseling, colorectal cancer screening, and recommended immunizations.    CLL - Followed by Dr. Ma Hillock. Just had chemotherapy treatment yesterday. Unsure medications given.  Past medical, surgical, family and social history per today's encounter.  Review of Systems  Constitutional: Negative for fever, chills, activity change, appetite change, fatigue and unexpected weight change.  Eyes: Negative for visual disturbance.  Respiratory: Negative for cough and shortness of breath.   Cardiovascular: Negative for chest pain, palpitations and leg swelling.  Gastrointestinal: Negative for nausea, vomiting, abdominal pain, diarrhea, constipation and abdominal distention.  Genitourinary: Negative for dysuria, urgency and difficulty urinating.  Musculoskeletal: Negative for myalgias, arthralgias and gait problem.  Skin: Negative for color change and rash.  Hematological: Negative for adenopathy.  Psychiatric/Behavioral: Negative for sleep disturbance and dysphoric mood. The patient is not nervous/anxious.        Objective:    BP 122/72 mmHg  Pulse 61  Temp(Src) 98.3 F (36.8 C) (Oral)  Ht 5\' 9"  (1.753 m)  Wt 217 lb (98.431 kg)  BMI 32.03 kg/m2  SpO2 100% Physical Exam  Constitutional: He is oriented to person,  place, and time. He appears well-developed and well-nourished. No distress.  HENT:  Head: Normocephalic and atraumatic.  Right Ear: External ear normal.  Left Ear: External ear normal.  Nose: Nose  normal.  Mouth/Throat: Oropharynx is clear and moist. No oropharyngeal exudate.  Eyes: Conjunctivae and EOM are normal. Pupils are equal, round, and reactive to light. Right eye exhibits no discharge. Left eye exhibits no discharge. No scleral icterus.  Neck: Normal range of motion. Neck supple. No tracheal deviation present. No thyromegaly present.  Cardiovascular: Normal rate, regular rhythm and normal heart sounds.  Exam reveals no gallop and no friction rub.   No murmur heard. Pulmonary/Chest: Effort normal and breath sounds normal. No respiratory distress. He has no wheezes. He has no rales. He exhibits no tenderness.  Abdominal: Soft. Bowel sounds are normal. He exhibits no distension and no mass. There is no tenderness. There is no rebound and no guarding.  Musculoskeletal: Normal range of motion. He exhibits no edema.  Lymphadenopathy:    He has no cervical adenopathy.  Neurological: He is alert and oriented to person, place, and time. No cranial nerve deficit. Coordination normal.  Skin: Skin is warm and dry. No rash noted. He is not diaphoretic. No erythema. No pallor.  Psychiatric: He has a normal mood and affect. His behavior is normal. Judgment and thought content normal.          Assessment & Plan:   Problem List Items Addressed This Visit      Unprioritized   Chronic lymphatic leukemia    Reports he has recently been receiving IV chemotherapy. Will request records from oncology.    Relevant Orders      CBC with Differential   HYPERCHOLESTEROLEMIA   Relevant Orders      Comprehensive metabolic panel      Lipid panel   Medicare annual wellness visit, subsequent - Primary    General medical exam is normal today. Health maintenance is up to date with the exception of Prevnar vaccine. Will hold on this until chemotherapy complete. Labs today including CBC, CMP, lipids, PSA.  Encourage continued efforts at healthy diet and increase physical activity. Followup in 6 months  or sooner as needed.      Relevant Orders      PSA, Medicare       Return in about 6 months (around 01/05/2015) for Recheck.

## 2014-07-06 NOTE — Patient Instructions (Signed)

## 2014-07-06 NOTE — Assessment & Plan Note (Signed)
General medical exam is normal today. Health maintenance is up to date with the exception of Prevnar vaccine. Will hold on this until chemotherapy complete. Labs today including CBC, CMP, lipids, PSA.  Encourage continued efforts at healthy diet and increase physical activity. Followup in 6 months or sooner as needed.

## 2014-07-06 NOTE — Progress Notes (Signed)
Pre visit review using our clinic review tool, if applicable. No additional management support is needed unless otherwise documented below in the visit note. 

## 2014-07-11 LAB — CBC CANCER CENTER
Basophil #: 0.1 x10 3/mm (ref 0.0–0.1)
Basophil %: 0.8 %
EOS PCT: 2 %
Eosinophil #: 0.2 x10 3/mm (ref 0.0–0.7)
HCT: 35.9 % — ABNORMAL LOW (ref 40.0–52.0)
HGB: 12.2 g/dL — AB (ref 13.0–18.0)
LYMPHS ABS: 1.1 x10 3/mm (ref 1.0–3.6)
Lymphocyte %: 14.5 %
MCH: 32.5 pg (ref 26.0–34.0)
MCHC: 33.9 g/dL (ref 32.0–36.0)
MCV: 96 fL (ref 80–100)
MONO ABS: 0.8 x10 3/mm (ref 0.2–1.0)
Monocyte %: 11.3 %
Neutrophil #: 5.2 x10 3/mm (ref 1.4–6.5)
Neutrophil %: 71.4 %
Platelet: 166 x10 3/mm (ref 150–440)
RBC: 3.74 10*6/uL — ABNORMAL LOW (ref 4.40–5.90)
RDW: 15.5 % — ABNORMAL HIGH (ref 11.5–14.5)
WBC: 7.4 x10 3/mm (ref 3.8–10.6)

## 2014-07-18 LAB — URIC ACID: URIC ACID: 4.4 mg/dL (ref 3.5–7.2)

## 2014-07-18 LAB — BASIC METABOLIC PANEL
Anion Gap: 4 — ABNORMAL LOW (ref 7–16)
BUN: 10 mg/dL (ref 7–18)
CO2: 29 mmol/L (ref 21–32)
Calcium, Total: 8.5 mg/dL (ref 8.5–10.1)
Chloride: 109 mmol/L — ABNORMAL HIGH (ref 98–107)
Creatinine: 0.75 mg/dL (ref 0.60–1.30)
EGFR (Non-African Amer.): >60
GLUCOSE: 115 mg/dL — AB (ref 65–99)
Osmolality: 283 (ref 275–301)
Potassium: 4 mmol/L (ref 3.5–5.1)
Sodium: 142 mmol/L (ref 136–145)

## 2014-07-18 LAB — CBC CANCER CENTER
Basophil #: 0.1 x10 3/mm (ref 0.0–0.1)
Basophil %: 1 %
EOS ABS: 0.2 x10 3/mm (ref 0.0–0.7)
Eosinophil %: 1.8 %
HCT: 37.1 % — ABNORMAL LOW (ref 40.0–52.0)
HGB: 12.6 g/dL — ABNORMAL LOW (ref 13.0–18.0)
Lymphocyte #: 1.3 x10 3/mm (ref 1.0–3.6)
Lymphocyte %: 14.2 %
MCH: 32.6 pg (ref 26.0–34.0)
MCHC: 34 g/dL (ref 32.0–36.0)
MCV: 96 fL (ref 80–100)
MONOS PCT: 11.7 %
Monocyte #: 1 x10 3/mm (ref 0.2–1.0)
Neutrophil #: 6.3 x10 3/mm (ref 1.4–6.5)
Neutrophil %: 71.3 %
Platelet: 179 x10 3/mm (ref 150–440)
RBC: 3.86 10*6/uL — ABNORMAL LOW (ref 4.40–5.90)
RDW: 15.7 % — ABNORMAL HIGH (ref 11.5–14.5)
WBC: 8.8 x10 3/mm (ref 3.8–10.6)

## 2014-07-18 LAB — HEPATIC FUNCTION PANEL A (ARMC)
ALBUMIN: 3.7 g/dL (ref 3.4–5.0)
ALT: 31 U/L
AST: 17 U/L (ref 15–37)
Alkaline Phosphatase: 81 U/L
BILIRUBIN TOTAL: 0.9 mg/dL (ref 0.2–1.0)
Bilirubin, Direct: 0.2 mg/dL (ref 0.0–0.2)
TOTAL PROTEIN: 6.9 g/dL (ref 6.4–8.2)

## 2014-07-18 IMAGING — CT CT CHEST-ABD-PELV W/ CM
2 of 5 series · 14 of 36 positions shown, 16 images · IV contrast (omnipaque)
Comparison: Prior examinations [DATE].

CLINICAL DATA: Chronic lymphocytic leukemia. Restaging post
chemotherapy.

EXAM:
CT CHEST, ABDOMEN, AND PELVIS WITH CONTRAST
TECHNIQUE: Multidetector CT imaging of the chest, abdomen and pelvis was
performed following the standard protocol during bolus
administration of intravenous contrast.
CONTRAST:  100 ml Omnipaque 300.

[Series 2: cap with · axial · 0.79mm/px · z∈[-884,-310]mm · 11 of 133 slices shown, 13 images]
[im 9/133  mediastinal]
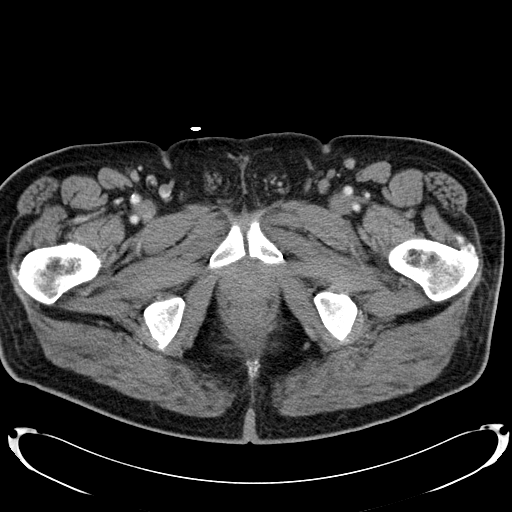
[im 9/133  bone]
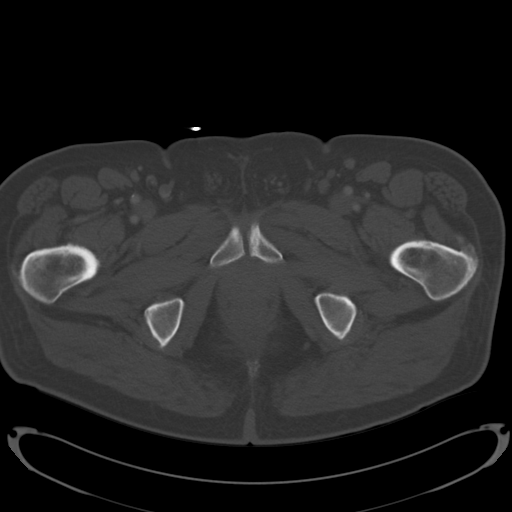
[im 18/133  mediastinal]
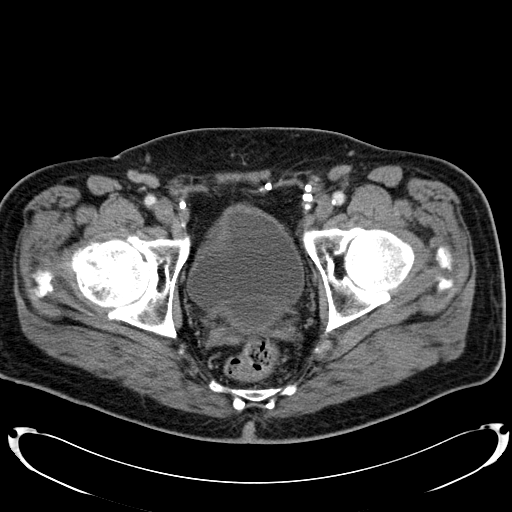
[im 36/133  mediastinal]
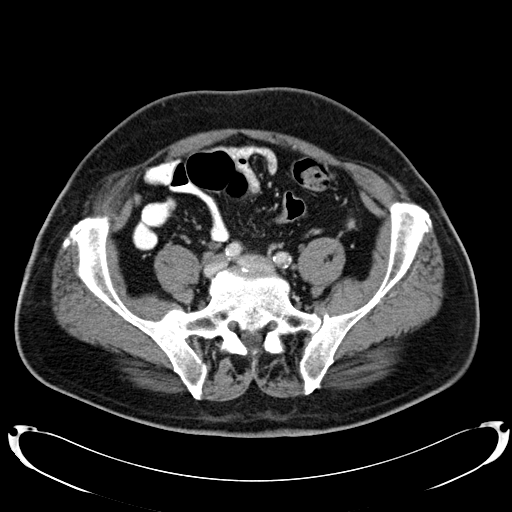
[im 45/133  mediastinal]
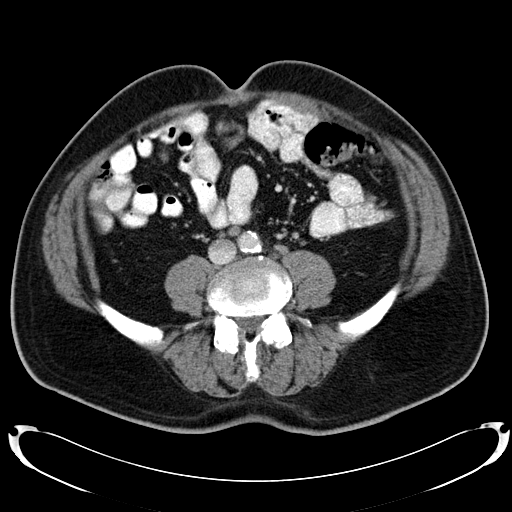
[im 53/133  mediastinal]
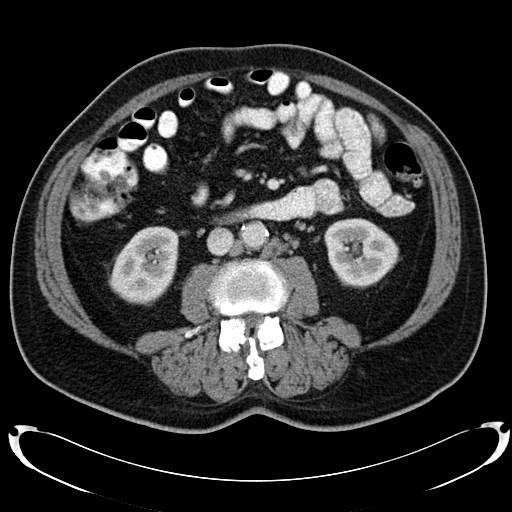
[im 71/133  mediastinal]
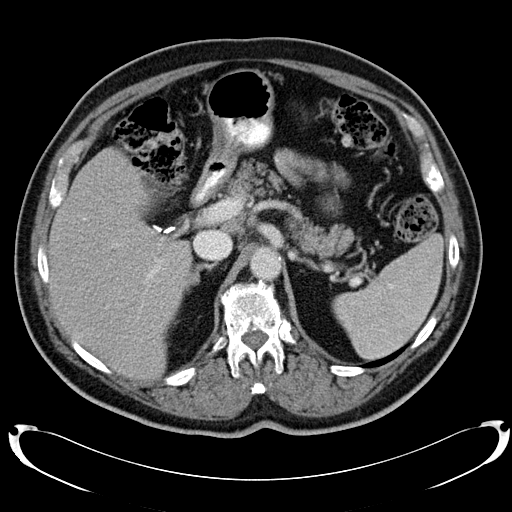
[im 80/133  mediastinal]
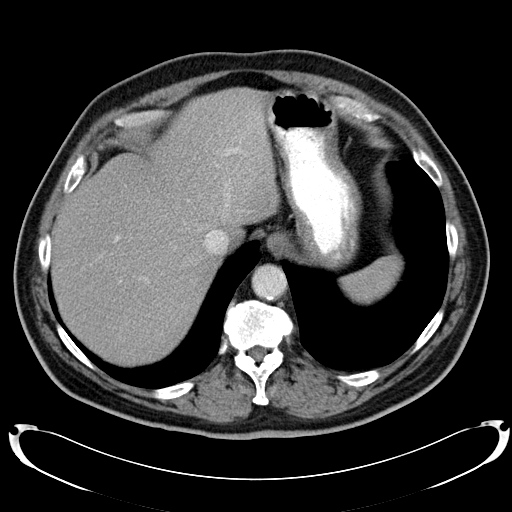
[im 89/133  mediastinal]
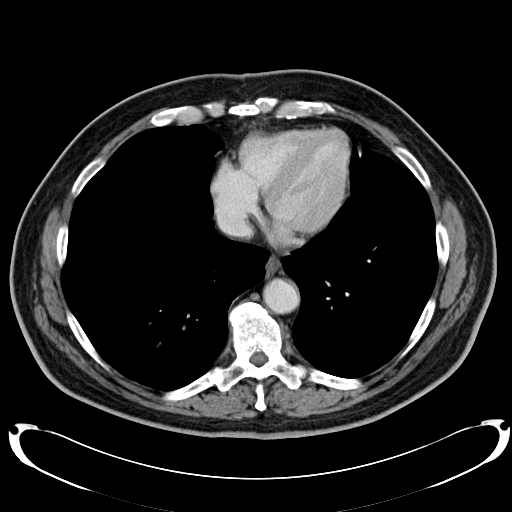
[im 97/133  mediastinal]
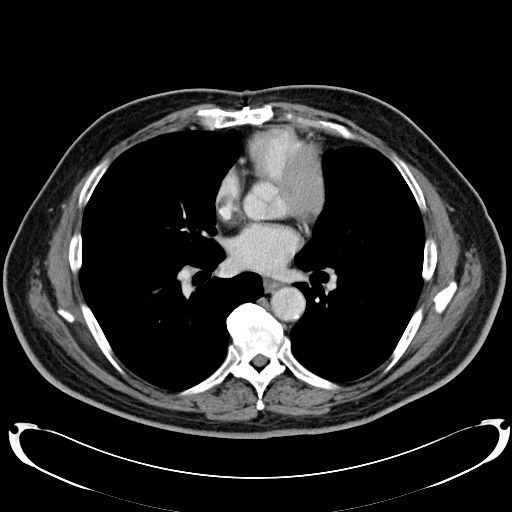
[im 97/133  bone]
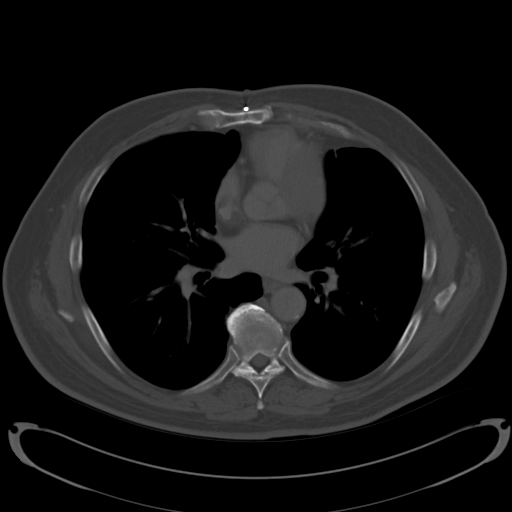
[im 115/133  mediastinal]
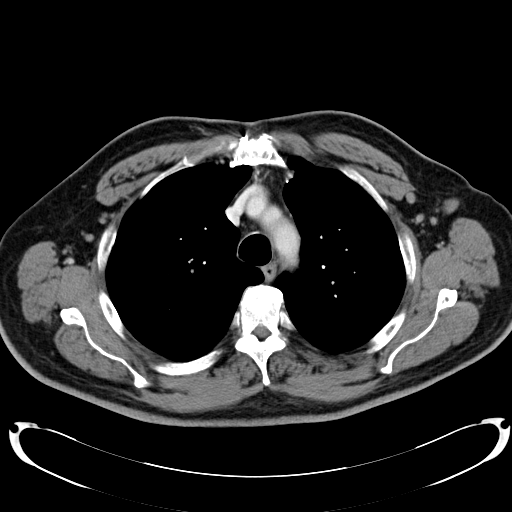
[im 124/133  mediastinal]
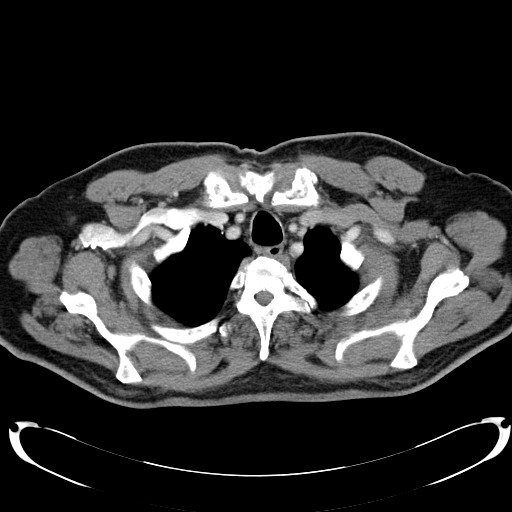

[Series 5: cor cap with cor · coronal · 0.89mm/px · 3 of 176 slices shown]
[im 36/176  mediastinal]
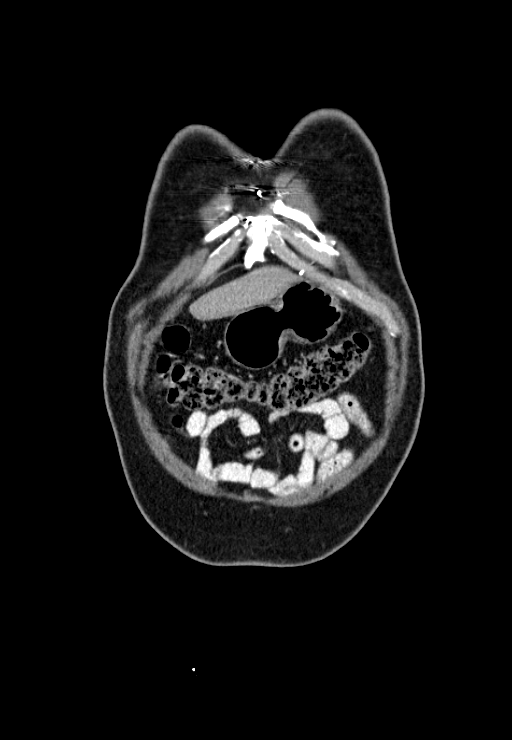
[im 71/176  mediastinal]
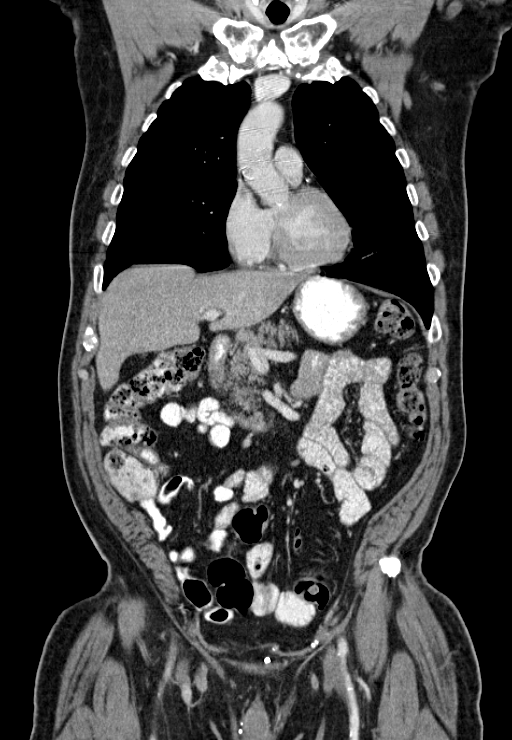
[im 106/176  mediastinal]
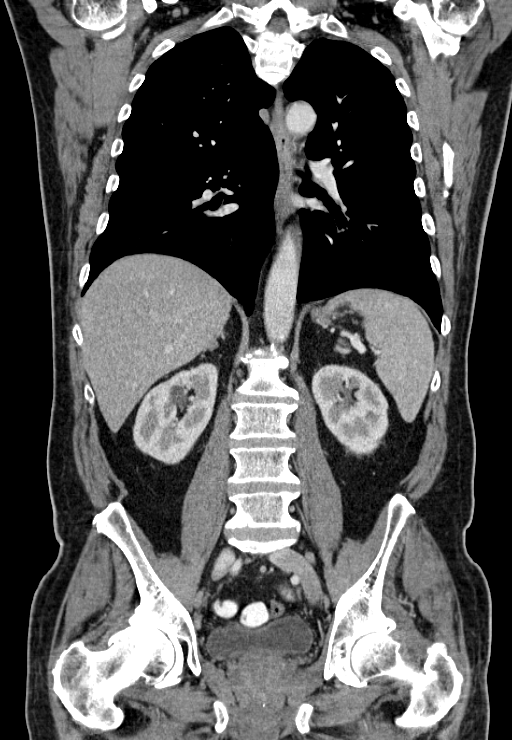

[14 of 36 positions shown; findings below may reference images not displayed]

FINDINGS: CT CHEST FINDINGS

Mediastinum: There has been significant interval improvement in the
previously demonstrated adenopathy involving the mediastinal and
hilar stations. The remaining lymph nodes are all within normal
limits for size for The thyroid gland, trachea and esophagus
demonstrate no significant findings. The heart size is normal. There
is no pericardial effusion.There is stable atherosclerosis of the
aorta, great vessels and coronary arteries status post CABG.

Lungs/Pleura: There is no pleural effusion.Stable mild emphysema and
linear scarring or atelectasis at both lung bases. No suspicious
pulmonary nodule or confluent airspace opacity.

Musculoskeletal/Chest wall: Axillary adenopathy has significantly
improved. The largest remaining right axillary node measures 1.4 cm
short axis on image 14 (previously 3.4 cm). On the left, the largest
node measures 1.5 cm on image 15 (previously 2.9 cm). No suspicious
osseous findings.

CT ABDOMEN AND PELVIS FINDINGS

Hepatobiliary: The liver is normal in density without focal
abnormality. No significant biliary dilatation status post
cholecystectomy.

Pancreas: Unremarkable. No pancreatic ductal dilatation or
surrounding inflammatory changes.

Spleen: The spleen is now normal in size without focal abnormality.

Adrenals/Urinary Tract: Both adrenal glands appear normal.The
kidneys appear stable. There is a small parapelvic cyst in the upper
pole of the right kidney. The left kidney appears normal. There is
no hydronephrosis. The bladder appears normal.

Stomach/Bowel: No evidence of bowel wall thickening, distention or
surrounding inflammatory change.No ascites or focal extraluminal
fluid collection. No residual peritoneal nodularity identified.

Vascular/Lymphatic: There has been significant improvement in the
previously demonstrated abdominal pelvic lymphadenopathy. A node in
the porta hepatis measures 1.5 cm on image 61 (previously 3.6 cm), a
portal caval node measures 1.0 cm on image 65 (previously 2.7 cm), a
1.3 cm left periaortic node on image 76 previously measured 2.3 cm,
and aortocaval node measures 1.0 cm on image 79 (previously 2.5 cm),
and a right pelvic sidewall node measures 8 mm on image 111
(previously 2.0 cm). There is no progressive adenopathy. There is
stable atherosclerosis of the aorta, its branches and the iliac
arteries.

Reproductive: There is stable mild enlargement of the prostate
gland. The seminal vesicles appear unchanged.

Other: There are stable postsurgical changes in both inguinal
regions without recurrent hernia.

Musculoskeletal: No acute or significant osseous findings. There are
stable asymmetric right hip degenerative changes.
IMPRESSION: 1. Interval near-complete resolution of previously demonstrated
extensive lymphadenopathy. There are few residual borderline lymph
nodes within the axilla and upper abdomen.
2. Interval resolution of splenomegaly and peritoneal nodularity.
3. Findings are consistent with significant response to interval
therapy for CLL.

## 2014-07-21 ENCOUNTER — Ambulatory Visit: Payer: Self-pay | Admitting: Internal Medicine

## 2014-07-25 LAB — CBC CANCER CENTER
Basophil #: 0.1 x10 3/mm (ref 0.0–0.1)
Basophil %: 1.2 %
Eosinophil #: 0.3 x10 3/mm (ref 0.0–0.7)
Eosinophil %: 3.4 %
HCT: 36.8 % — ABNORMAL LOW (ref 40.0–52.0)
HGB: 12.4 g/dL — ABNORMAL LOW (ref 13.0–18.0)
Lymphocyte #: 1.3 x10 3/mm (ref 1.0–3.6)
Lymphocyte %: 15.9 %
MCH: 32.1 pg (ref 26.0–34.0)
MCHC: 33.7 g/dL (ref 32.0–36.0)
MCV: 95 fL (ref 80–100)
MONO ABS: 1.1 x10 3/mm — AB (ref 0.2–1.0)
Monocyte %: 14.1 %
NEUTROS PCT: 65.4 %
Neutrophil #: 5.3 x10 3/mm (ref 1.4–6.5)
PLATELETS: 194 x10 3/mm (ref 150–440)
RBC: 3.87 10*6/uL — AB (ref 4.40–5.90)
RDW: 15.1 % — ABNORMAL HIGH (ref 11.5–14.5)
WBC: 8.1 x10 3/mm (ref 3.8–10.6)

## 2014-08-01 LAB — CBC CANCER CENTER
BASOS PCT: 1.8 %
Basophil #: 0.1 x10 3/mm (ref 0.0–0.1)
EOS ABS: 0.3 x10 3/mm (ref 0.0–0.7)
EOS PCT: 4.1 %
HCT: 36.8 % — ABNORMAL LOW (ref 40.0–52.0)
HGB: 12.6 g/dL — AB (ref 13.0–18.0)
LYMPHS ABS: 1.2 x10 3/mm (ref 1.0–3.6)
Lymphocyte %: 15.7 %
MCH: 32.3 pg (ref 26.0–34.0)
MCHC: 34.2 g/dL (ref 32.0–36.0)
MCV: 95 fL (ref 80–100)
MONO ABS: 0.9 x10 3/mm (ref 0.2–1.0)
Monocyte %: 11.6 %
NEUTROS ABS: 5.1 x10 3/mm (ref 1.4–6.5)
Neutrophil %: 66.8 %
Platelet: 181 x10 3/mm (ref 150–440)
RBC: 3.89 10*6/uL — ABNORMAL LOW (ref 4.40–5.90)
RDW: 15.2 % — ABNORMAL HIGH (ref 11.5–14.5)
WBC: 7.7 x10 3/mm (ref 3.8–10.6)

## 2014-08-01 LAB — CREATININE, SERUM
CREATININE: 0.87 mg/dL (ref 0.60–1.30)
EGFR (African American): >60
EGFR (Non-African Amer.): >60

## 2014-08-08 LAB — CBC CANCER CENTER
BASOS ABS: 0.1 x10 3/mm (ref 0.0–0.1)
Basophil %: 1 %
Eosinophil #: 0.2 x10 3/mm (ref 0.0–0.7)
Eosinophil %: 2.8 %
HCT: 37.8 % — ABNORMAL LOW (ref 40.0–52.0)
HGB: 12.9 g/dL — ABNORMAL LOW (ref 13.0–18.0)
LYMPHS ABS: 0.6 x10 3/mm — AB (ref 1.0–3.6)
LYMPHS PCT: 7.1 %
MCH: 32.5 pg (ref 26.0–34.0)
MCHC: 34 g/dL (ref 32.0–36.0)
MCV: 95 fL (ref 80–100)
Monocyte #: 1 x10 3/mm (ref 0.2–1.0)
Monocyte %: 12.7 %
NEUTROS PCT: 76.4 %
Neutrophil #: 5.9 x10 3/mm (ref 1.4–6.5)
Platelet: 164 x10 3/mm (ref 150–440)
RBC: 3.96 10*6/uL — ABNORMAL LOW (ref 4.40–5.90)
RDW: 15 % — AB (ref 11.5–14.5)
WBC: 7.8 x10 3/mm (ref 3.8–10.6)

## 2014-08-15 LAB — CBC CANCER CENTER
BASOS ABS: 0.1 x10 3/mm (ref 0.0–0.1)
BASOS PCT: 1.1 %
EOS ABS: 0.2 x10 3/mm (ref 0.0–0.7)
Eosinophil %: 3.2 %
HCT: 37.3 % — ABNORMAL LOW (ref 40.0–52.0)
HGB: 12.6 g/dL — AB (ref 13.0–18.0)
Lymphocyte #: 0.7 x10 3/mm — ABNORMAL LOW (ref 1.0–3.6)
Lymphocyte %: 10.8 %
MCH: 32.1 pg (ref 26.0–34.0)
MCHC: 33.7 g/dL (ref 32.0–36.0)
MCV: 95 fL (ref 80–100)
Monocyte #: 0.9 x10 3/mm (ref 0.2–1.0)
Monocyte %: 13 %
NEUTROS ABS: 4.9 x10 3/mm (ref 1.4–6.5)
NEUTROS PCT: 71.9 %
Platelet: 159 x10 3/mm (ref 150–440)
RBC: 3.92 10*6/uL — ABNORMAL LOW (ref 4.40–5.90)
RDW: 15.3 % — ABNORMAL HIGH (ref 11.5–14.5)
WBC: 6.8 x10 3/mm (ref 3.8–10.6)

## 2014-08-15 LAB — HEPATIC FUNCTION PANEL A (ARMC)
Albumin: 3.8 g/dL (ref 3.4–5.0)
Alkaline Phosphatase: 73 U/L (ref 46–116)
BILIRUBIN TOTAL: 1 mg/dL (ref 0.2–1.0)
Bilirubin, Direct: 0.2 mg/dL (ref 0.0–0.2)
SGOT(AST): 30 U/L (ref 15–37)
SGPT (ALT): 34 U/L (ref 14–63)
TOTAL PROTEIN: 6.9 g/dL (ref 6.4–8.2)

## 2014-08-15 LAB — BASIC METABOLIC PANEL
ANION GAP: 6 — AB (ref 7–16)
BUN: 13 mg/dL (ref 7–18)
CALCIUM: 8.7 mg/dL (ref 8.5–10.1)
CO2: 28 mmol/L (ref 21–32)
Chloride: 109 mmol/L — ABNORMAL HIGH (ref 98–107)
Creatinine: 0.87 mg/dL (ref 0.60–1.30)
EGFR (Non-African Amer.): >60
Glucose: 108 mg/dL — ABNORMAL HIGH (ref 65–99)
Osmolality: 286 (ref 275–301)
Potassium: 4.4 mmol/L (ref 3.5–5.1)
Sodium: 143 mmol/L (ref 136–145)

## 2014-08-15 LAB — URIC ACID: Uric Acid: 4.5 mg/dL (ref 3.5–7.2)

## 2014-08-21 ENCOUNTER — Ambulatory Visit: Payer: Self-pay | Admitting: Internal Medicine

## 2014-08-22 LAB — CBC CANCER CENTER
BASOS ABS: 0.1 x10 3/mm (ref 0.0–0.1)
Basophil %: 1.3 %
EOS ABS: 0.2 x10 3/mm (ref 0.0–0.7)
Eosinophil %: 4 %
HCT: 36.4 % — ABNORMAL LOW (ref 40.0–52.0)
HGB: 12.4 g/dL — AB (ref 13.0–18.0)
LYMPHS ABS: 0.9 x10 3/mm — AB (ref 1.0–3.6)
Lymphocyte %: 16.6 %
MCH: 32.4 pg (ref 26.0–34.0)
MCHC: 34 g/dL (ref 32.0–36.0)
MCV: 95 fL (ref 80–100)
MONO ABS: 1 x10 3/mm (ref 0.2–1.0)
Monocyte %: 16.7 %
Neutrophil #: 3.5 x10 3/mm (ref 1.4–6.5)
Neutrophil %: 61.4 %
Platelet: 156 x10 3/mm (ref 150–440)
RBC: 3.82 10*6/uL — AB (ref 4.40–5.90)
RDW: 14.8 % — ABNORMAL HIGH (ref 11.5–14.5)
WBC: 5.7 x10 3/mm (ref 3.8–10.6)

## 2014-09-04 ENCOUNTER — Emergency Department: Payer: Self-pay | Admitting: Emergency Medicine

## 2014-09-04 IMAGING — CR LEFT RING FINGER 2+V
1 series · 3 of 3 positions shown · non-contrast
Comparison: None.

CLINICAL DATA: Dog bite at the distal portion of the ring finger

EXAM:
LEFT RING FINGER 2+V

[Series 1: pa · 0.17mm/px · 3 of 3 slices shown]
[im 1/3]
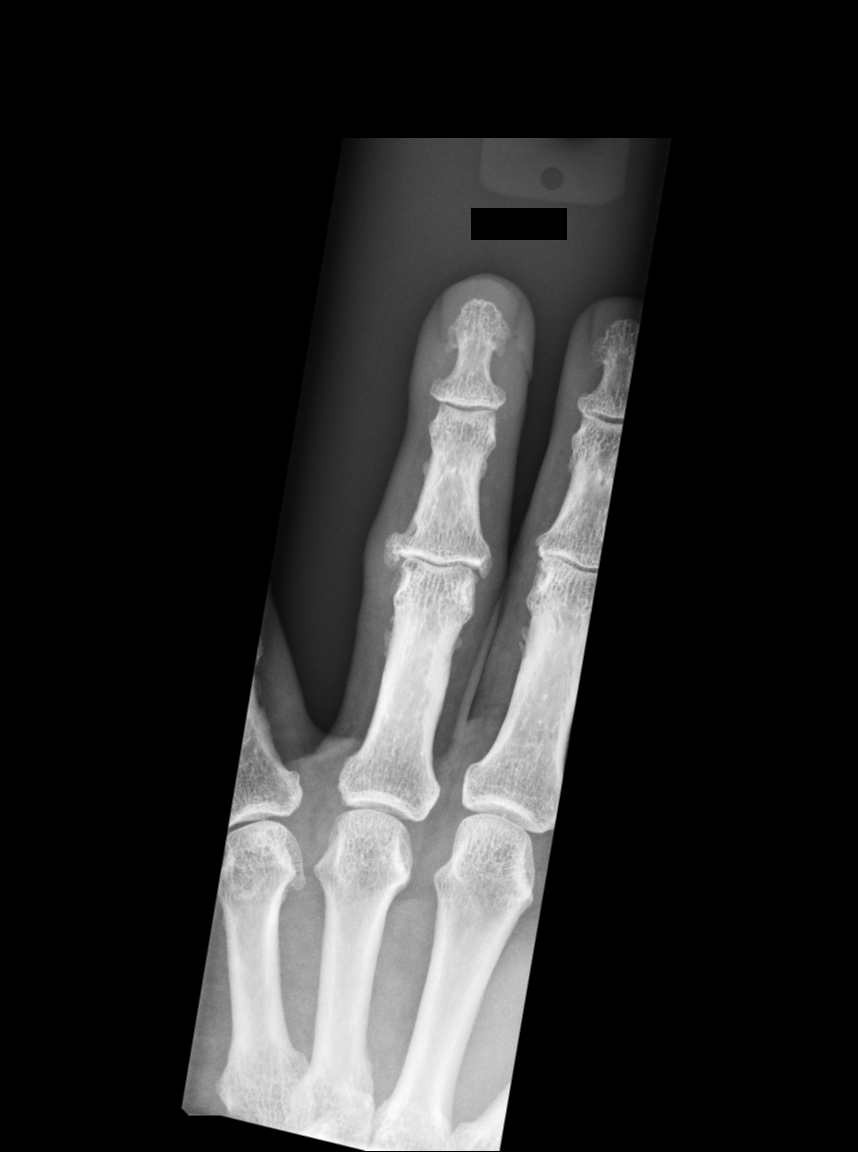
[im 2/3]
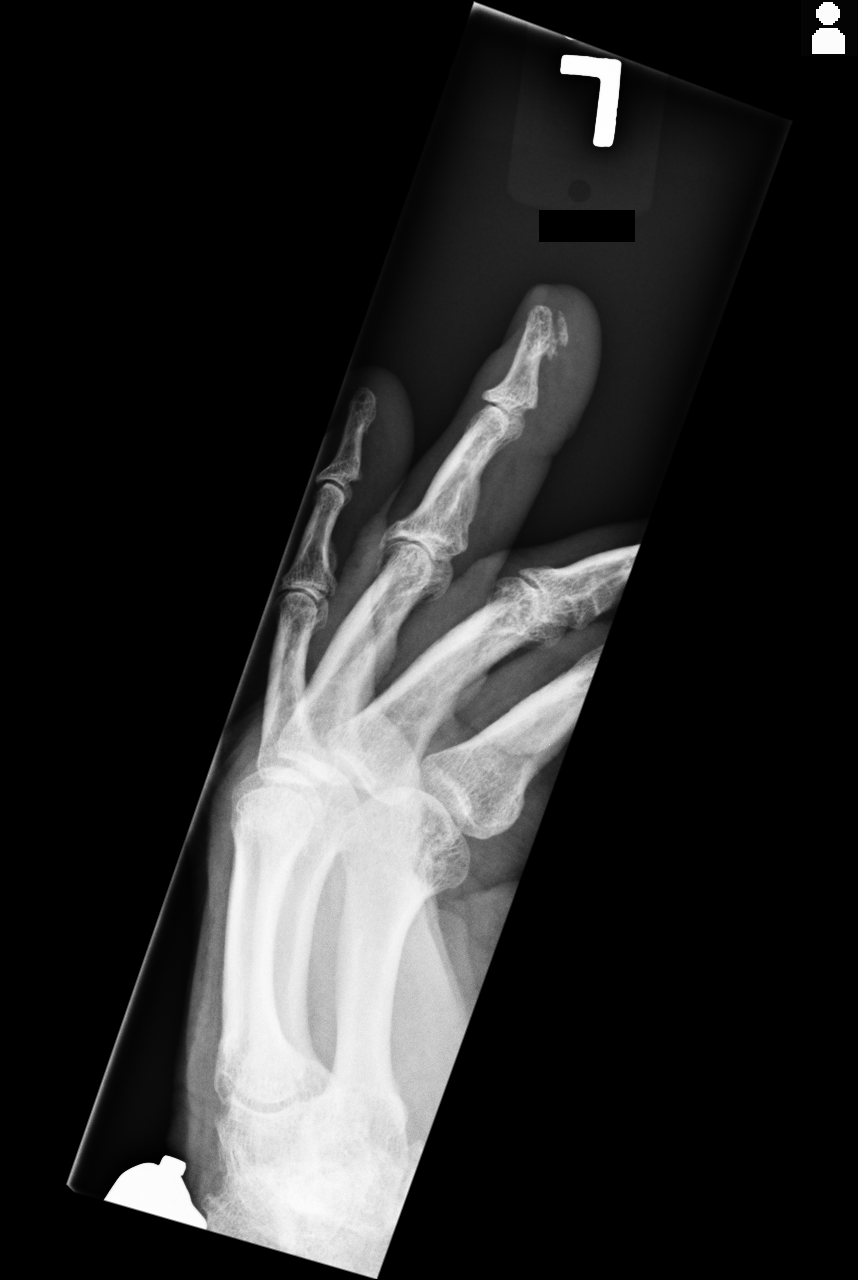
[im 3/3]
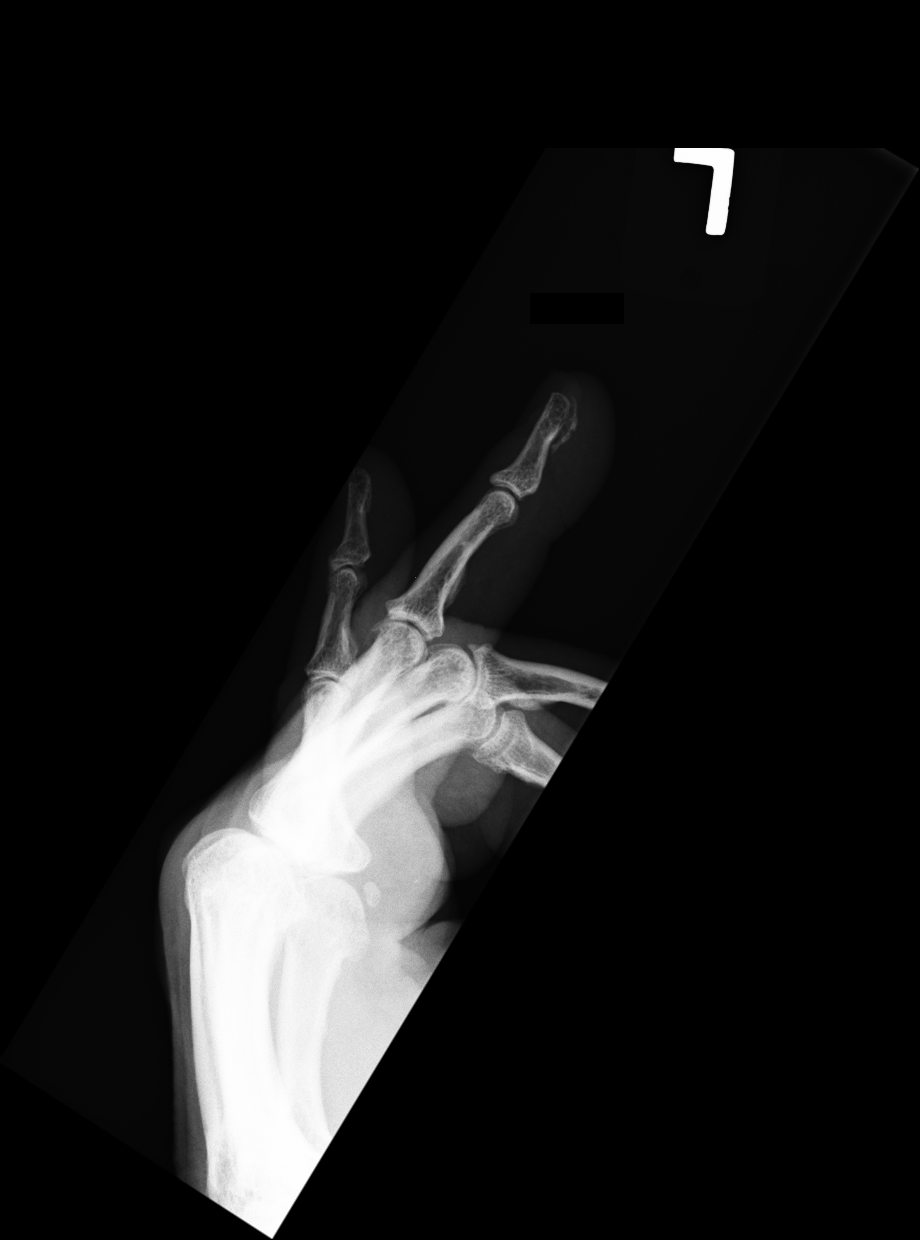

[3 of 3 positions shown; findings below may reference images not displayed]

FINDINGS: There is fracture of the distal tuft of the fourth distal phalanx.
There is surrounding soft tissue swelling. No radiopaque foreign
body is identified. Degenerative joint changes of the visualized
fingers are identified.
IMPRESSION: Fracture of the distal tuft of the fourth distal phalanx.

## 2014-09-19 ENCOUNTER — Ambulatory Visit
Admit: 2014-09-19 | Disposition: A | Discharge status: Home / Self Care | Payer: Self-pay | Attending: Internal Medicine | Admitting: Internal Medicine

## 2014-10-11 ENCOUNTER — Telehealth: Payer: Self-pay | Admitting: *Deleted

## 2014-10-11 ENCOUNTER — Emergency Department: Payer: Self-pay | Admitting: Emergency Medicine

## 2014-10-11 LAB — COMPREHENSIVE METABOLIC PANEL WITH GFR
Albumin: 4.1 g/dL
Alkaline Phosphatase: 61 U/L
Anion Gap: 6 — ABNORMAL LOW
BUN: 15 mg/dL
Bilirubin,Total: 0.9 mg/dL
Calcium, Total: 8.8 mg/dL — ABNORMAL LOW
Chloride: 106 mmol/L
Co2: 27 mmol/L
Creatinine: 0.89 mg/dL
EGFR (African American): >60
EGFR (Non-African Amer.): >60
Glucose: 107 mg/dL — ABNORMAL HIGH
Potassium: 3.3 mmol/L — ABNORMAL LOW
SGOT(AST): 23 U/L
SGPT (ALT): 22 U/L
Sodium: 139 mmol/L
Total Protein: 6.4 g/dL — ABNORMAL LOW

## 2014-10-11 LAB — URINALYSIS, COMPLETE
Bacteria: NONE SEEN
Bilirubin,UR: NEGATIVE
Glucose,UR: NEGATIVE mg/dL (ref 0–75)
Ketone: NEGATIVE
Leukocyte Esterase: NEGATIVE
Nitrite: NEGATIVE
Ph: 5 (ref 4.5–8.0)
Protein: NEGATIVE
RBC,UR: 2 /HPF (ref 0–5)
SPECIFIC GRAVITY: 1.017 (ref 1.003–1.030)
Squamous Epithelial: NONE SEEN
WBC UR: 2 /HPF (ref 0–5)

## 2014-10-11 LAB — CBC
HCT: 36.8 % — AB (ref 40.0–52.0)
HGB: 12.8 g/dL — AB (ref 13.0–18.0)
MCH: 33.2 pg (ref 26.0–34.0)
MCHC: 34.9 g/dL (ref 32.0–36.0)
MCV: 95 fL (ref 80–100)
Platelet: 175 10*3/uL (ref 150–440)
RBC: 3.87 10*6/uL — ABNORMAL LOW (ref 4.40–5.90)
RDW: 15.4 % — ABNORMAL HIGH (ref 11.5–14.5)
WBC: 5.7 10*3/uL (ref 3.8–10.6)

## 2014-10-11 LAB — CK TOTAL AND CKMB (NOT AT ARMC)
CK, Total: 99 U/L
CK-MB: 2.8 ng/mL

## 2014-10-11 LAB — TROPONIN I: Troponin-I: 0.03 ng/mL

## 2014-10-11 LAB — MAGNESIUM: MAGNESIUM: 1.9 mg/dL

## 2014-10-11 IMAGING — CR DG CHEST 1V PORT
1 series · 1 of 1 positions shown · non-contrast
Comparison: [DATE]

CLINICAL DATA: Atrial fibrillation, hypertension

EXAM:
PORTABLE CHEST - 1 VIEW

[ap]
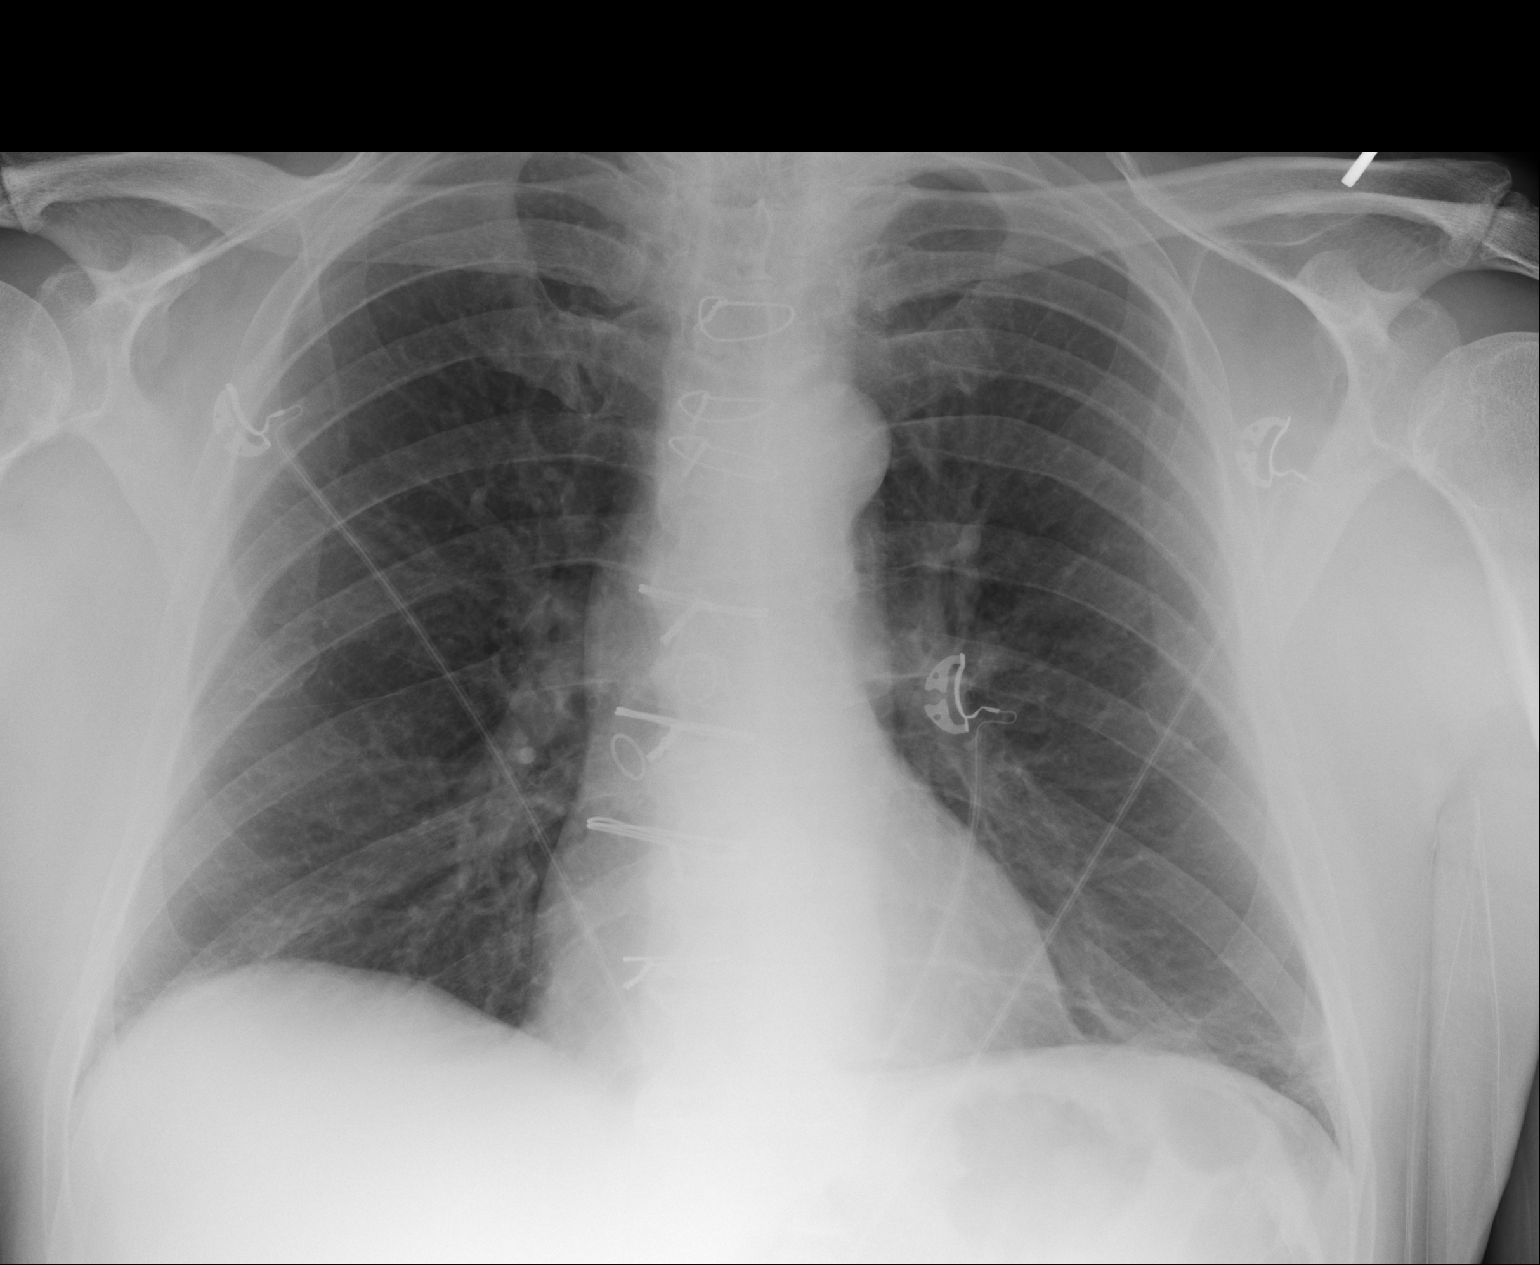

[1 of 1 positions shown; findings below may reference images not displayed]

FINDINGS: Cardiomediastinal silhouette is stable. Status post CABG. No acute
infiltrate or pleural effusion. No pulmonary edema. Stable left
basilar atelectasis or scarring.
IMPRESSION: No active disease.  No significant change.  Status post CABG.

## 2014-10-11 NOTE — Telephone Encounter (Signed)
Attempted to call pt back to check on him, but got no answer.  Did not leave a message.

## 2014-10-11 NOTE — Telephone Encounter (Signed)
Pt wife calling stating pt bp 98/50 something Happen to him last week.  Pt is a Afib patient.  HR 150 at rest

## 2014-10-11 NOTE — Telephone Encounter (Signed)
Spoke w/ pt's wife, who put pt on the phone.  Pt reports that he is sitting on the couch and his HR as been 151 just sitting on the couch.  BP 98/50 and pt is SOB. Advised pt to call EMS and have them come out to see if they can cardiovert him.  He is agreeable and will call back if we can be of further assistance.

## 2014-10-20 ENCOUNTER — Ambulatory Visit
Admit: 2014-10-20 | Disposition: A | Discharge status: Home / Self Care | Payer: Self-pay | Attending: Internal Medicine | Admitting: Internal Medicine

## 2014-10-24 LAB — CBC CANCER CENTER
BASOS PCT: 1 %
Basophil #: 0 x10 3/mm (ref 0.0–0.1)
EOS PCT: 4.5 %
Eosinophil #: 0.2 x10 3/mm (ref 0.0–0.7)
HCT: 37.7 % — AB (ref 40.0–52.0)
HGB: 12.8 g/dL — ABNORMAL LOW (ref 13.0–18.0)
LYMPHS ABS: 0.8 x10 3/mm — AB (ref 1.0–3.6)
Lymphocyte %: 17.4 %
MCH: 32.3 pg (ref 26.0–34.0)
MCHC: 34 g/dL (ref 32.0–36.0)
MCV: 95 fL (ref 80–100)
MONO ABS: 0.6 x10 3/mm (ref 0.2–1.0)
MONOS PCT: 11.9 %
NEUTROS ABS: 3.1 x10 3/mm (ref 1.4–6.5)
Neutrophil %: 65.2 %
Platelet: 136 x10 3/mm — ABNORMAL LOW (ref 150–440)
RBC: 3.96 10*6/uL — AB (ref 4.40–5.90)
RDW: 14.6 % — ABNORMAL HIGH (ref 11.5–14.5)
WBC: 4.8 x10 3/mm (ref 3.8–10.6)

## 2014-10-27 LAB — CREATININE, SERUM: Creatine, Serum: 0.89

## 2014-10-30 ENCOUNTER — Other Ambulatory Visit: Payer: Self-pay | Admitting: Internal Medicine

## 2014-10-30 DIAGNOSIS — C9110 Chronic lymphocytic leukemia of B-cell type not having achieved remission: Secondary | ICD-10-CM

## 2014-11-11 NOTE — H&P (Signed)
PATIENT NAME:  [name], [name] MR#:  [id number] DATE OF BIRTH:  [birth year]  DATE OF ADMISSION:  03/07/2014  PRIMARY CARE PROVIDER: Dr. Ronette Deter.   CARDIOLOGIST: Dr. Rockey Situ.    EMERGENCY DEPARTMENT REFERRING PHYSICIAN: Dr. Reita Cliche.   CHIEF COMPLAINT: Palpitations, severe leukocytosis.   HISTORY OF PRESENT ILLNESS: The patient is a 69 year old white male with history of coronary artery disease, status post CABG, history of paroxysmal atrial fibrillation, who states that earlier this morning when he woke up his heart rate was very elevated at 157. He took his blood pressure medication. Heart rate did drop a little bit. He felt his heart rate to be very irregular. He tried to go to work. At work he was told by the nurse that he needed to see a doctor so he went to Dr. Donivan Scull office and was noted to have a very elevated heart rate.  He was sent here to the Emergency Department. In the ED the patient's heart varied anywhere from the 90s to 130s, especially when he moves his heart rate goes up into the 130s. The patient otherwise reports no chest pain. He states that he is chronically short of breath due to his COPD. He otherwise denies any nausea, vomiting or diarrhea. Also noted, in the Emergency Room the patient was noted to have a WBC count that was elevated to 88.9. His WBC count in 2013 was normal. The patient denies any fevers, chills. No cough. No abdominal pain. No diarrhea. No nausea or vomiting. He does report a history of tick exposure and tick bites on his arm, but he has not had any rash. He denies any significant weight loss. He reports maybe a 1-2 pound weight loss during the summertime, which is common for him.   PAST MEDICAL HISTORY:  1.  History of coronary artery disease, with CABG in 2010 with 3-vessel involvement. Subsequent catheterization in 2011 revealed occluded vein grafts x 2 with patent LIMA with left to right collaterals.  2.  History of hypercholesterolemia.  3.   Hypertension.  4.  History of paroxysmal atrial fibrillation, diagnosed in 2013.  5.  History of arthritis.  6.  History of rheumatic fever as a child, history of chickenpox.  7.  History of COPD.   PAST SURGICAL HISTORY: Status post cholecystectomy, appendectomy, tonsillectomy and adenoidectomy, CABG, hernia repair in the abdomen.   SOCIAL HISTORY: History of smoking, quit a long time ago. 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   CURRENT MEDICATIONS AT HOME: Pradaxa 150 mg b.i.d., Nitrostat 0.4 mg sublingually p.r.n., metoprolol tartrate 25 mg q. 6 hours, fish oil 1 capsule p.o. daily, ezetimibe 10 mg once a day, Co-Q-10 one tablet p.o. daily, atorvastatin 20 mg other every other day in the evening, aspirin 325 p.o. daily.   REVIEW OF SYSTEMS: CONSTITUTIONAL: Denies any fevers. Complains of fatigue, weakness. No pain. No weight loss or weight gain.  EYES: No blurred or double vision. No pain. No redness. No inflammation. No glaucoma. No cataracts.  ENT: No tinnitus. No ear pain. No hearing loss. No seasonal or year-round allergies. No epistaxis. No nasal discharge. No difficulty swallowing.  RESPIRATORY: Denies any cough, wheezing, hemoptysis. Has history of COPD, chronic dyspnea. No tuberculosis.  CARDIOVASCULAR: Denies any chest pain, orthopnea, edema. Has a history of paroxysmal atrial fibrillation. No palpitation. No syncope.  GASTROINTESTINAL: No nausea, vomiting, diarrhea. No abdominal pain.  No hematemesis. No melena. No guarding. No IBS, no jaundice no rectal bleeding.  GENITOURINARY: Denies any dysuria, hematuria, renal calculus or frequency.  ENDOCRINE: Denies any polyuria, nocturia or thyroid problems.  HEMATOLOGIC AND LYMPHATIC: Denies anemia, easy bruisability or bleeding.  SKIN: No acne. No rash. No changes in mole, hair or skin.  MUSCULOSKELETAL: Denies any  pain in the neck, back or shoulder.  NEUROLOGIC: No CVA, TIA, or  seizures.  PSYCHIATRIC: No anxiety, insomnia, or ADD.   PHYSICAL EXAMINATION:  VITAL SIGNS: Temperature 97.8, pulse 110, respirations 20, blood pressure 115/66.  GENERAL: The patient is a well-developed male in no acute distress.  HEENT: Head atraumatic, normocephalic. Pupils equally round, reactive to light and accommodation. There is no conjunctival pallor. No scleral icterus. Nasal exam shows no drainage or ulceration. External ear exam shows no erythema or lesions. Mouth is moist without exudates.  NECK: Supple, symmetric, no masses. Thyroid midline, not enlarged, no JVD. Nontender, full range of motion without pain.  RESPIRATORY: Good respiratory effort, clear to auscultation without any rales, rhonchi, wheezing. No accessory muscle usage.  CARDIOVASCULAR: Irregularly irregular rhythm. No murmurs, rubs, clicks, or gallops.  GASTROINTESTINAL: Abdomen is soft, nontender, nondistended. Positive bowel sounds x 4. No hepatosplenomegaly.  GENITOURINARY: Deferred.  MUSCULOSKELETAL: There is no erythema or swelling.  SKIN: No rash.  LYMPHATICS: No lymph nodes palpable.  NEUROLOGIC: Cranial nerves II-XII grossly intact. No focal deficits.  PSYCHIATRIC: Not anxious or depressed.   LABORATORY DATA:  WBC count 88.9, hemoglobin 12.9, platelet count 134. His differential is currently pending. BMP shows glucose 117, BUN 17, creatinine 0.95, sodium 143, potassium 4.3, chloride 109, CO2 is 23, calcium 8.4, troponin 0.07, BNP 516.  EKG shows atrial fibrillation with no ST-T wave changes.   Chest x-ray mild left basilar atelectasis. No other abnormality.   ASSESSMENT AND PLAN: The patient is a 69 year old white male with history of atrial fibrillation, presents with palpitations.  1.  Severe leukocytosis. There is concern for hematologic malignancy. Emergency Department physician has spoken to Dr. Ma Hillock.  The differential is currently  pending. I will get a flow cytometry of the blood. He reports recent tick bite, will get tick serology. Doxycycline for the time being. It is unlikely that tickborne illness is causing these symptoms, however, we will empirically treat him for now until we know what is happening with his WBC count.  2.  Atrial fibrillation, seen by cardiology. Started on Eliquis and p.o. Cardizem. I will increase the Cardizem dose to 60 mg. We will hold amlodipine.  3.  Chronic obstructive pulmonary disease. We will place him on inhalers as needed.  4.  Hyperlipidemia. Continue atorvastatin.  5.  Miscellaneous. Eliquis will be used for DVT prophylaxis.   Time spent on this patient: 50 minutes.   ____________________________ [name], MD shp:lt D: 03/07/2014 13:27:41 ET T: 03/07/2014 14:06:36 ET JOB#: [id number]  cc: Judd Mccubbin H. Posey Pronto, MD, <Dictator> Alric Seton MD ELECTRONICALLY SIGNED 03/12/2014 13:56

## 2014-11-11 NOTE — Discharge Summary (Signed)
PATIENT NAME:  [name], [name] MR#:  [id number] DATE OF BIRTH:  04/01/1946  DATE OF ADMISSION:  03/07/2014 DATE OF DISCHARGE:  03/09/2014  ADMITTING DIAGNOSIS: Palpitations, severe leukocytosis.   DISCHARGE DIAGNOSES: 1.  Palpitations due to atrial fibrillation with rapid ventricular response. The patient initially was started on Cardizem and metoprolol. However, his heart rate remained elevated. Therefore, he had to be placed on an amiodarone drip. At the time of discharge, he has converted to normal sinus rhythm.  2.  Severe leukocytosis felt to be due to chronic lymphocytic leukemia. The patient's peripheral smear showed smudge cells, seen by Dr. Ma Doyle.  Will have outpatient follow-up.  3.  Chronic obstructive pulmonary disease without any evidence of exacerbation.  4.  Hyperlipidemia.  5.  History of coronary artery disease with coronary artery bypass graft.  6.  Arthritis. 7.  History of rheumatic fever as a child. 8.  History of chickenpox.  9.  Status post cholecystectomy.  10.  Status post appendectomy.  11.  Status post tonsillectomy and adenoidectomy.  12.  Status post hernia repair in the abdomen.   CONSULTANTS: Dr. Rockey Doyle of cardiology and Dr. Ma Doyle.  PERTINENT DIAGNOSTIC DATA: Admitting WBC count 88.9, hemoglobin 12.9, platelet count 134,000. BMP: Glucose 117, BUN 17, creatinine 0.95, sodium 143, potassium 4.3, chloride 109, CO2 23, calcium 8.4. Troponin 0.07. BNP 516.  EKG shows A-fib with no ST-T wave changes.   Chest x-ray with mild left basilar atelectasis. No other abnormality.   An echocardiogram of the heart showed EF 50% to 55%, LVH, mild increase in left ventricular septal thickness, mild to moderate aortic valve sclerosis, small inferior vena cava, mildly increased left ventricular posterior wall thickness.   Troponin 0.08. Lyme disease IgG and IgM less than 0.91. Rickettsial fever antibodies positive for IgG.   HOSPITAL COURSE: Please refer to the H and P done  by the admitting physician. The patient is a 69 year old white male who presented to the Emergency Room with complaint of having palpitations. His heart rate was beating very fast into the 130s to 120s. He was seen by his cardiologist on the day of admission and sent to the ED for admission. The patient was noted to be in A-fib with RVR. However, initially, his heart rate did stabilize in the ER so he was started on p.o. medications. Despite the p.o. medications his heart rate continued to be elevated so on the 19th he was started on an amiodarone drip by cardiology. The patient converted early morning of August 20th into normal sinus rhythm and he was bradycardic. Therefore, cardiology recommended his metoprolol that he was taking previously. The patient was also started on apixaban for anticoagulation. The patient also on presentation was noted to have severe leukocytosis. Peripheral blood smear did reveal that he had findings consistent with smudge cell which suggested CLL. He was seen by Dr. Ma Doyle who will follow him as an outpatient. The patient, at this time, his heart rate is much better and has been cleared by cardiology to be discharged.   DISCHARGE MEDICATIONS: Fish oil 1 tab p.o. daily, CoQ10 one tab p.o. daily, Nitrostat 0.4 sublingual p.r.n. for chest pain, metoprolol tartrate 25 one p.o. b.i.d., Spiriva 18 mcg daily, amlodipine 10 daily, Symbicort 160/4.5 mcg 2 puffs b.i.d., atorvastatin 80/0.5 at bedtime, apixaban 5 mg 1 tab p.o. b.i.d.   DISCHARGE DIET: Low-sodium, low-fat, low-cholesterol.   DISCHARGE ACTIVITY: As tolerated.   DISCHARGE FOLLOWUP: Follow up with primary MD in 1 to 2 weeks. Follow with  Dr. Rockey Doyle in 1 to 2 weeks. Follow with Dr. Ma Doyle in 2 to 4 weeks.  TIME SPENT: 35 minutes.  ____________________________ Michael Mosses Posey Pronto, MD shp:sb D: 03/10/2014 08:32:38 ET T: 03/10/2014 11:10:23 ET JOB#: [id number]  cc: Michael Lovering H. Posey Pronto, MD, <Dictator> Michael Seton  MD ELECTRONICALLY SIGNED 03/12/2014 13:57

## 2014-11-11 NOTE — Consult Note (Signed)
PATIENT NAME:  [name] MR#:  [id number] DATE OF BIRTH:  [birth year]  DATE OF CONSULTATION:  03/08/2014  REFERRING PHYSICIAN:  Dustin Flock, MD CONSULTING PHYSICIAN:  Zenaida Tesar R. Ma Hillock, MD  REASON FOR CONSULTATION: Significant progressive leukocytosis.   HISTORY OF PRESENT ILLNESS: The patient is a 69 year old gentleman with past medical history significant for coronary artery disease, status post CABG 2010, subsequent catheterization 2011 revealed occluded vein grafts x 2, hyperlipidemia, hypertension, paroxysmal atrial fibrillation, arthritis, COPD, rheumatic fever as a child, history of chickenpox, cholecystectomy, appendectomy, tonsillectomy, adenoidectomy, CABG surgery, hernia repair, who has been admitted to the hospital with palpitation and also noted to have severe leukocytosis. WBC count on admission was elevated at 88,900 with mostly lymphocyte predominant. Smudge cells have been reported. CBC otherwise showed minimal anemia with hemoglobin 12.9, platelet count 134,000. The patient denies any bleeding issues. He gives history of tick bite but no fevers, chills or night sweats. Appetite is good, no unintentional weight loss. No new bone pains. States that his palpitations are better today. Denies any known history of leukocytosis in the past or diagnosis of CLL in the past.   PAST MEDICAL HISTORY AND SURGICAL HISTORY: As in HPI above.   FAMILY HISTORY: Remarkable for heart disease. Denies hematological disorders.   SOCIAL HISTORY: Ex-smoker, quit many years ago. Denies alcohol or recreational drug usage. Physically active.   ALLERGIES: No known drug allergies.   HOME MEDICATIONS: Metoprolol tartrate 25 mg q. 6 hours, Pradaxa 150 mg b.i.d., Nitrostat 0.4 mg sublingual p.r.n., fish oil 1 capsule daily, CoQ10 one tablet daily, atorvastatin 20 mg every other day, aspirin 325 mg daily, ezetimibe 10 mg daily.   REVIEW OF SYSTEMS:  CONSTITUTIONAL: He has chronic fatigue and generalized  weakness on exertion. Denies fevers or chills. No major night sweats.  HEENT: Denies any headaches, dizziness, epistaxis, ear or jaw pain. No sinus symptoms.  CARDIAC: As in HPI. Denies any dyspnea at rest, orthopnea or PND.  LUNGS: Denies any new cough, progressive shortness of breath, wheezing, hemoptysis.  GASTROINTESTINAL: No nausea, vomiting or diarrhea. No bright red blood in stools or melena.  GENITOURINARY: No dysuria or hematuria.  SKIN: No new rashes or pruritus.  HEMATOLOGIC: Denies bleeding issues.  EXTREMITIES: No new swelling or pain.  MUSCULOSKELETAL: Denies any new bone pains or back pains.  NEUROLOGIC: No new focal weakness, seizures or loss of consciousness.  ENDOCRINE: No polyuria or polydipsia. Appetite steady.   PHYSICAL EXAMINATION:  GENERAL: Patient is a moderately built and well-nourished individual, sitting in bed, alert and oriented and converses appropriately. No icterus or pallor.  VITAL SIGNS: Temperature 98, pulse 63, respirations 20, blood pressure 101/59 and 97% on room air.  HEENT: Normocephalic, atraumatic. Extraocular movements intact. Sclerae anicteric.  NECK: Supple. Left mid cervical adenopathy measuring around 1.5 cm, nontender.  CARDIOVASCULAR: S1, S2, tachycardic, regular.  LUNGS: Show bilateral good air entry, diminished at bases, no rhonchi.  ABDOMEN: Soft. No hepatosplenomegaly clinically.  EXTREMITIES: No major edema or cyanosis.  SKIN: No generalized rashes or major bruising.  LYMPHATICS: He has bilateral axillary adenopathy measuring up to 2 cm. He has bilateral inguinal adenopathy measuring up to 1 to 2 cm.  MUSCULOSKELETAL: No obvious joint redness or swelling.  NEUROLOGICAL: Limited examination. Cranial nerves seem intact. Moves all extremities spontaneously.   LABORATORY RESULTS: CBC today shows WBC 77,300 with 89% lymphocytes, ALC 69,200 and 8% neutrophils with ANC 6100, platelet count 124,000, hemoglobin 12.1, creatinine 1.11, calcium  8.2, potassium 4.2. TSH normal at  1.06. Recent creatinine 0.95. Smudge cells reported on smear.   IMPRESSION AND RECOMMENDATIONS: A 69 year old gentleman with past medical history as detailed above, admitted with significant leukocytosis, palpitations and weakness. Based on findings of leukocytosis with mostly absolute lymphocytosis, presence of smudge cells on smear and mild lymphadenopathy noted in the neck, axillary and inguinal areas, it is possible that he has lymphoproliferative disorder like chronic lymphocytic leukemia versus other. Peripheral blood immunophenotyping study (flow cytometry) has been sent and report is pending at this time. I have explained to the patient and his daughter present at bedside that if this turns out to be chronic lymphocytic leukemia, then it is most likely stage I disease (given lymphadenopathy) or could be stage II disease (if he has hepatosplenomegaly not appreciable on clinical exam today), and otherwise he does not have significant anemia, thrombocytopenia or bulky symptomatic lymphadenopathy. He does not have major B symptoms. Plan would most likely be observation and he might need treatment in the future when disease progresses. If he is discharged soon, will arrange for outpatient followup and also contact patient with results of flow cytometry. The patient is agreeable to this plan.   Thank you for the referral. Please feel free to contact me if any additional questions.   ____________________________ Rhett Bannister Ma Hillock, MD srp:TT D: 03/08/2014 20:28:14 ET T: 03/08/2014 21:03:00 ET JOB#: [id number]  cc: Trevor Iha R. Ma Hillock, MD, <Dictator> Alveta Heimlich MD ELECTRONICALLY SIGNED 03/09/2014 0:02

## 2014-11-11 NOTE — Consult Note (Signed)
General Aspect PCP:  Shelia Media, MD Cardiologist:  Concha Se, MD _____________  69 y/o male with a h/o CAD s/p CABG and PAF who was seen in clinic this AM 2/2 recurrent tachypalpitations and was found to be in afib. _____________  Past Medical History   ???  Coronary artery disease          a.  10/10 CABG x 3 (LIMA->LAD, VG->OM1, VG->PDA)      b. 09/2009 Cath: occluded vein grafts x2 with patent LIMA. L->R collaterals,  EF 55% mild anterolateral hypokinesis.      c. 10/2011 Myoview: EF 53%, no isch/infarct->low-risk. ???  Pure hypercholesterolemia     ???  Hypertension     ???  Paroxysmal Afib      a. Dx 2013 - quiescent until today. ???  History of arthritis     ???  Arthritis     ???  Chicken pox     ???  Arrhythmia     ???  Rheumatic fever     ???  COPD (chronic obstructive pulmonary disease)     ???  Arthritis    Past Surgical History    ???  Cholecystectomy       ???  Appendectomy  1988   ???  Tonsillectomy and adenoidectomy  1956   ???  Coronary artery bypass graft 2010 3v   ???  Hernia repair abdominal   ____________   Present Illness 69 y/o male with the above problem list.   He is s/p CABG x 3 in 2010 with subsequent finding of occluded vein grafts (patent LIMA->LAD) on his last cath in 09/2009.  His last myoview was in 10/2011 and was non-ischemic/low-risk.  He also has a h/o PAF dating back to 2013 when he presented with afib and converted spontaneously.  He has not been on oral anticoagualtion @ home (CHA2DS2VASc = 3), and has instead been on asa and bb therapy.  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  He was in his USOH until this AM when he awoke with tachypalpitations w/o associated Ss.  He took his AM meds (lopressor/amlodipine) and initially thought that his HR improved.  He drove to work and there saw the nurse on duty.  His heart rhythm  remained irregular and he was tachycardic so he left work and drove to our office.  He was seen by Dr. Graciela Husbands as an add-on and was found to be in afib with RVR @ 118.  Decision was made to tx him to the ED for admission, IV dilt, anticoagulation, and possibly DCCV if necessary.  He remains in afib currently.  He denies chest pain, dyspnea, presyncope/syncope.  Rates are currently in the 90's to low 100's @ rest.  CBC shows WBC of 88.9K.   Physical Exam:  GEN well developed, well nourished, no acute distress, pleasant   HEENT pink conjunctivae, moist oral mucosa   NECK supple  No masses  no bruits/jvd.   RESP normal resp effort  clear BS   CARD Irregular rate and rhythm  No murmur   ABD denies tenderness  soft  normal BS   LYMPH negative neck   EXTR negative cyanosis/clubbing, negative edema   SKIN normal to palpation   NEURO cranial nerves intact, motor/sensory function intact   PSYCH alert, A+O to time, place, person   Review of Systems:  Subjective/Chief  Complaint SOB, palpitations, improved   General: No Complaints   Skin: Has had several 'stings' - was on prednisone and abx earlier this month r/t left elbow insect bite. Has had ticks - last about 3 wks ago. No rashes or sequelae after tick removal.   ENT: No Complaints   Eyes: No Complaints   Neck: No Complaints   Respiratory: No Complaints   Cardiovascular: Palpitations   Gastrointestinal: No Complaints   Genitourinary: No Complaints   Vascular: No Complaints   Musculoskeletal: No Complaints   Neurologic: No Complaints   Hematologic: No Complaints   Endocrine: No Complaints   Psychiatric: No Complaints   Review of Systems: All other systems were reviewed and found to be negative   Medications/Allergies Reviewed Medications/Allergies reviewed   Family & Social History:  Family and Social History:  Family History Coronary Artery Disease   Social History negative ETOH, negative Illicit drugs, prev  heavy smoker - quit ~ 2009.   Place of Living Home  Lives with wife in [place].  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          Admit Diagnosis:   A FIB: Onset Date: 07-Mar-2014, Status: Active, Description: A FIB   LEUKOCYTOSIS: Onset Date: 07-Mar-2014, Status: Active, Description: LEUKOCYTOSIS  Home Medications:  Medication Instructions Status  ezetimibe 10 mg oral tablet 1 tab(s) orally once a day in the morning. **brand name zetia** Active  aspirin 325 mg oral delayed release tablet 1 tab(s) orally once a day in the evening. Active  atorvastatin 20 mg oral tablet 1 tab(s) orally every other day in the evening. **brand name lipitor** Active  Fish Oil oral capsule 1 cap(s) orally once a day as needed.  **not for sure of strength** Active  Co Q-10 1 cap(s) orally once a day. **not for sure of strength** Active  metoprolol tartrate 25 mg oral tablet tab(s) orally every 6 hours Active  Spiriva 18 mcg inhalation capsule 1 each inhaled once a day (at bedtime) Active  amLODIPine 10 mg oral tablet 1 tab(s) orally once a day Active  Symbicort 160 mcg-4.5 mcg/inh inhalation aerosol 2 puff(s) inhaled 2 times a day, As Needed Active   Lab Results:  LabUnknown:  18-Aug-15 09:49   Result Interpretation Peripheral blood smear reveals atypical lymphocytosis. Flow cytometry studies are pending. T.Rubinas MD.  Result(s) reported on 07 Mar 2014 at 10:35AM.  Routine Chem:  18-Aug-15 09:49   Result Comment Troponin - RESULTS VERIFIED BY REPEAT TESTING.  - Notified Juliann Pulse Cox @ 1110 on 03/07/14  - [facility]  - READ-BACK PROCESS PERFORMED.  Result(s) reported on 07 Mar 2014 at 11:27AM.  B-Type Natriuretic Peptide [facility])  516 (Result(s) reported on 07 Mar 2014 at 11:44AM.)  Glucose, Serum  111  BUN 17  Creatinine (comp) 0.95  Sodium, Serum 143  Potassium, Serum 4.3  Chloride, Serum  109  CO2, Serum 23  Calcium (Total), Serum  8.4  Anion Gap 11   Osmolality (calc) 287  eGFR (African American) >60  eGFR (Non-African American) >60 (eGFR values <1mL/min/1.73 m2 may be an indication of chronic kidney disease (CKD). Calculated eGFR is useful in patients with stable renal function. The eGFR calculation will not be reliable in acutely ill patients when serum creatinine is changing rapidly. It is not useful in  patients on dialysis. The eGFR calculation may not be applicable to patients at the low and high extremes of body sizes, pregnant women, and vegetarians.)  Cardiac:  18-Aug-15 09:49   Troponin I  0.07 (0.00-0.05 0.05 ng/mL or less: NEGATIVE  Repeat testing in 3-6 hrs  if clinically indicated. >0.05 ng/mL: POTENTIAL  MYOCARDIAL INJURY. Repeat  testing in 3-6 hrs if  clinically indicated. NOTE: An increase or decrease  of 30% or more on serial  testing suggests a  clinically important change)  Routine Hem:  18-Aug-15 09:49   Reference Accession# [id number]  Segmented Neutrophils 10  Lymphocytes 42  Monocytes 3  Other Cells 45  Diff Comment 1 ANISOCYTOSIS  Diff Comment 2 PLTS VARIED IN SIZE  Diff Comment 3 SMUDGE CELLS  Result(s) reported on 07 Mar 2014 at 01:23PM.  WBC (CBC)  88.9  RBC (CBC)  4.00  Hemoglobin (CBC)  12.9  Hematocrit (CBC) 40.4  Platelet Count (CBC)  134 (Result(s) reported on 07 Mar 2014 at 10:35AM.)  MCV  101  MCH 32.3  MCHC 32.0  RDW  15.1   EKG:  EKG Interp. by me   Interpretation EKG shows afib, 107, non-specific st/t changes.   Radiology Results: XRay:    18-Aug-15 10:33, Chest Portable Single View  Chest Portable Single View   REASON FOR EXAM:    hear racing  COMMENTS:       PROCEDURE: DXR - DXR PORTABLE CHEST SINGLE VIEW  - Mar 07 2014 10:33AM     CLINICAL DATA:  Heart is racing.    EXAM:  PORTABLE CHEST - 1 VIEW    COMPARISON:  07/19/2010 and 09/26/2011    FINDINGS:  Single view of the chest was obtained. Few linear densities at the  left lung base suggest  atelectasis. Otherwise, the lungs are clear  without edema. Heart size is normal with median sternotomy wires.  Negative for a pneumothorax.     IMPRESSION:  Mild left basilar atelectasis.  Otherwise, no acute chest findings.      Electronically Signed    By: Markus Daft M.D.    On: 03/07/2014 10:40         Verified By: ADAM Acquanetta Belling, M.D.,    No Known Allergies:   Vital Signs/Nurse's Notes:  **Vital Signs.:   18-Aug-15 13:59  Vital Signs Type Admission  Temperature Temperature (F) 97.6  Celsius 36.4  Temperature Source oral  Pulse Pulse 78  Respirations Respirations 19  Systolic BP Systolic BP 030  Diastolic BP (mmHg) Diastolic BP (mmHg) 82  Mean BP 95  Pulse Ox % Pulse Ox % 99  Pulse Ox Activity Level  At rest  Oxygen Delivery Room Air/ 21 %    Impression 1.  Afib with RVR:   Pt awoke this AM with tachypalpitations and has been found to be in afib with RVR.  At rest, rates are trending in the 90's to low 100's. Blood pressures are in the low 100's. He is asymptomatic at rest. --Cont home dose of BB. --Add PO dilt 30 q 6h. --CHA2DS2VASc = 3 -->Add Eliquis $RemoveBefore'5mg'kiOtXmdAsWIbf$  BID (reduce ASA to $Remo'81mg'tMvAN$ ). --If he does not convert by AM, we would plan to cardiovert. DCCV  could potentially be done as an outpt  2.  HTN:  Stable. --D/C amlodipine in favor of dilt @ this time.  3.  CAD with mild troponin elevation:   No chest pain or dyspnea. Troponin 0.07.  ? demand ischemia in setting of known multivessel CAD and rapid afib. --Follow trend. --Cont asa, bb, statin.  4.  Leukocytosis:  He does report multiple tick bites this spring/summer, last ~ 3 mos ago  w/o sequelae. He also had left arm insect bite ~ 3 wks ago requiring prednisone taper and abx - completed course ~ 2 wks ago - no sequelae. -- IM/Heme-Onc eval pending.  5.  HL:  Cont statin.   Electronic Signatures: Rogelia Mire (NP)  (Signed 18-Aug-15 12:19)  Authored: General Aspect/Present Illness, History and  Physical Exam, Review of System, Family & Social History, Home Medications, Labs, EKG , Radiology, Allergies, Impression/Plan Ida Rogue (MD)  (Signed 18-Aug-15 18:23)  Authored: General Aspect/Present Illness, History and Physical Exam, Review of System, Health Issues, Home Medications, Labs, EKG , Vital Signs/Nurse's Notes, Impression/Plan  Co-Signer: General Aspect/Present Illness, Home Medications, Radiology, Allergies   Last Updated: 18-Aug-15 18:23 by Ida Rogue (MD)

## 2014-11-12 NOTE — H&P (Signed)
PATIENT NAME:  [name], CHERAMIE MR#:  [id number] DATE OF BIRTH:  18-Jan-1946  DATE OF ADMISSION:  09/26/2011  PRIMARY CARE PHYSICIAN: Nonlocal  CARDIOLOGIST: Dr. Ida Rogue  CHIEF COMPLAINT: Palpitations and weakness.   HISTORY OF PRESENT ILLNESS: This is a 69 year old male who woke up early this morning and felt like his heart was racing and he felt generally weak. He almost had a presyncopal episode and felt fairly lethargic. Patient's wife called EMS and when they arrived patient was noted to be tachycardic with heart rates in the 160s. Patient was given one dose of IV Cardizem and shortly thereafter became fairly hypotensive but his heart rate did come down. He was brought to the ER for further evaluation. Patient does not have any previous history of atrial fibrillation but was noted to be in atrial fibrillation on arriving to the hospital. He denied any chest pain. He still felt weak but overall his weakness had improved since his heart rate was under better control. He denies any shortness of breath, any nausea, vomiting, fevers, chills, cough or any other associated symptoms presently. Patient was noted to have a slightly elevated troponin and hospitalist services were then contacted for further treatment and evaluation.   REVIEW OF SYSTEMS: CONSTITUTIONAL: No documented fever. No weight gain. No weight loss. HEENT: No blurry or double vision. ENT: No tinnitus. No postnasal drip. No redness of the oropharynx. RESPIRATORY: No cough, no wheeze, no hemoptysis. CARDIOVASCULAR: No chest pain. Positive palpitations. No syncope. RESPIRATORY: No cough, no wheeze, no hemoptysis, no dyspnea. GASTROINTESTINAL: No nausea, no vomiting, no diarrhea, no abdominal pain, no melena, no hematochezia. GENITOURINARY: No dysuria, no hematuria. ENDOCRINE: No polyuria, nocturia. No heat or cold intolerance. HEME: No anemia, no bruising, no bleeding. INTEGUMENTARY: No rashes. No lesions. MUSCULOSKELETAL: No arthritis, no  swelling, no gout. NEUROLOGIC: No numbness, no tingling, no ataxia, no seizure-type activity. PSYCH: No anxiety, no insomnia, no ADD.   PAST MEDICAL HISTORY:  1. Coronary artery disease, status post coronary artery bypass graft in October 2010. 2. Hypertension.  3. Hyperlipidemia.  4. History of tobacco abuse. 5. Arthritis.   ALLERGIES: No known drug allergies.   SOCIAL HISTORY: No smoking. No alcohol abuse. No illicit drug abuse. Lives at home with his wife.   FAMILY HISTORY: Significant for heart disease. Both mother and father are deceased. Patient cannot recall history on his father's side but his mother died from complications of heart disease.   CURRENT MEDICATIONS:  1. Amlodipine 10 mg daily.  2. Aspirin 325 mg daily.  3. Atorvastatin 20 mg daily.  4. Zetia 10 mg daily. 5. Imdur 30 mg daily.  6. Lisinopril 10 mg daily. 7. Metoprolol tartrate 25 mg b.i.d.   PHYSICAL EXAMINATION ON ADMISSION:  VITAL SIGNS: Patient's vital signs are noted to be: Temperature 97.5, pulse 87, respirations 18, blood pressure 94/62, sats 99% on room air.   GENERAL: He is a pleasant but lethargic-appearing male in no apparent distress.   HEENT: He is atraumatic, normocephalic. Extraocular muscles are intact. His pupils are equal, reactive to light. Sclerae anicteric. No conjunctival injection. No pharyngeal erythema.   NECK: Supple. No jugular venous distention, no bruits, no lymphadenopathy, no thyromegaly.   HEART: Irregular. No murmurs, no rubs, no clicks.   LUNGS: Clear to auscultation bilaterally. No rales, no rhonchi, no wheezes.   ABDOMEN: Soft, flat, nontender, nondistended. Has good bowel sounds. No hepatosplenomegaly appreciated.   EXTREMITIES: No evidence of any cyanosis, clubbing, or peripheral edema. Has +2 pedal and  radial pulses bilaterally.   NEUROLOGICAL: Patient is alert, awake, oriented x3 with no focal motor or sensory deficits appreciated bilaterally.   SKIN: Moist, warm  with no rash appreciated.   LYMPHATIC: There is no cervical or axillary lymphadenopathy.   LABORATORY, DIAGNOSTIC RADIOLOGICAL DATA: Serum glucose 109, BUN 17, creatinine 0.8, sodium 143, potassium 4.1, chloride 109, bicarbonate 23, magnesium 1.7. LFTs are within normal limits. White cell count 7.9, hemoglobin 13.7, hematocrit 40.1, platelet count 123, CK 243, troponin 0.07.   Patient did have an EKG done which shows atrial fibrillation with rapid ventricular response but normal axis and no other ST or T wave changes.   ASSESSMENT AND PLAN: This is a 69 year old male with history of coronary artery disease, status post coronary artery bypass graft, history of questionable atrial fibrillation, hypertension, hyperlipidemia, history of tobacco abuse presents to the hospital with palpitations, generalized weakness and noted to be in rapid atrial fibrillation, also noted to have an elevated troponin.  1. Atrial fibrillation. Patient has a history of ischemic heart disease although he does not have a history of previous atrial fibrillation. Currently his rate is controlled with some IV Cardizem. I will continue his metoprolol for rate control. Will cycle his cardiac markers. Continue aspirin for anticoagulation although if he does have a history of paroxysmal atrial fibrillation he might benefit from long-term anticoagulation including Pradaxa or even Xarelto. I will await for further cardiology evaluation. I will order a two-dimensional echocardiogram and consult cardiology. Patient is followed by the [facility].  2. Elevated troponin. Questionable if this is in the setting of acute coronary syndrome with tachycardia mediated from his atrial fibrillation. He currently has no chest pain. I will cycle his cardiac markers and follow. Continue aspirin, beta blocker, statin, and oxygen for now. Will get a cardiology consultation. Will also obtain an echocardiogram.  3. Hypotension. Patient was hypotensive as per  EMS after he got some Cardizem but it has much improved now. I will hold all his other antihypertensives except for rate controlling medications like metoprolol. I do not see any evidence of cardiogenic or septic shock explaining his hypotension presently. Will follow his hemodynamics.  4. Hyperlipidemia. Continue with Lipitor and Zetia.  5. Hypomagnesemia. Will replace and repeat his magnesium.  6. CODE STATUS: Patient is a FULL CODE.   TIME SPENT WITH ADMISSION: 50 minutes.  ____________________________ Belia Heman. Verdell Carmine, MD vjs:cms D: 09/26/2011 12:54:14 ET T: 09/26/2011 13:30:51 ET JOB#: [id number]  cc: Belia Heman. Verdell Carmine, MD, <Dictator> Henreitta Leber MD ELECTRONICALLY SIGNED 09/26/2011 13:54

## 2014-11-12 NOTE — Consult Note (Signed)
Chief Complaint:   Subjective/Chief Complaint Atrial fibrillation converted to NSR.  Denies any chest pain or SOB.  Feels much better in NSR   VITAL SIGNS/ANCILLARY NOTES: **Vital Signs.:   09-Mar-13 05:04   Vital Signs Type Routine   Temperature Temperature (F) 98   Celsius 36.6   Pulse Pulse 63   Respirations Respirations 17   Systolic BP Systolic BP 115   Diastolic BP (mmHg) Diastolic BP (mmHg) 70   Mean BP 85   Pulse Ox % Pulse Ox % 97   Pulse Ox Activity Level  At rest   Oxygen Delivery 2L    08:07   Vital Signs Type Routine   Temperature Temperature (F) 97.4   Celsius 36.3   Temperature Source oral   Pulse Pulse 59   Respirations Respirations 18   Systolic BP Systolic BP 123   Diastolic BP (mmHg) Diastolic BP (mmHg) 75   Mean BP 91   Pulse Ox % Pulse Ox % 99   Pulse Ox Activity Level  At rest   Oxygen Delivery 2L    11:39   Vital Signs Type Routine   Temperature Temperature (F) 97.5   Celsius 36.3   Temperature Source tympanic   Pulse Pulse 78   Pulse source per Dinamap   Respirations Respirations 18   Systolic BP Systolic BP 126   Diastolic BP (mmHg) Diastolic BP (mmHg) 75   Mean BP 92   BP Source Dinamap   Pulse Ox % Pulse Ox % 96   Pulse Ox Activity Level  At rest   Oxygen Delivery 2L   Brief Assessment:   Cardiac Regular  no murmur  -- JVD    Respiratory normal resp effort  clear BS    Gastrointestinal Normal    Gastrointestinal details normal Soft    Additional Physical Exam Ext:  No edema.   Routine Chem:  08-Mar-13 10:58    B-Type Natriuretic Peptide ([facility]) 282  Cardiac:  08-Mar-13 10:58    CK, Total 243   CPK-MB, Serum 3.4  Routine Chem:  08-Mar-13 10:58    Glucose, Serum 109   BUN 17   Creatinine (comp) 0.84   Sodium, Serum 143   Potassium, Serum 4.1   Chloride, Serum 109   CO2, Serum 23   Calcium (Total), Serum 8.2  Hepatic:  08-Mar-13 10:58    Bilirubin, Total 0.7   Alkaline Phosphatase 61   SGPT (ALT) 26   SGOT (AST)  26   Total Protein, Serum 6.7   Albumin, Serum 3.7  Routine Chem:  08-Mar-13 10:58    Osmolality (calc) 287   eGFR (African American) >60   eGFR (Non-African American) >60   Anion Gap 11  Routine Hem:  08-Mar-13 10:58    WBC (CBC) 7.9   RBC (CBC) 4.23   Hemoglobin (CBC) 13.7   Hematocrit (CBC) 40.1   Platelet Count (CBC) 123   MCV 95   MCH 32.4   MCHC 34.1   RDW 13.7  Routine Chem:  08-Mar-13 10:58    Magnesium, Serum 1.7  Thyroid:  08-Mar-13 10:58    Thyroid Stimulating Hormone 0.88  Cardiac:  08-Mar-13 10:58    Troponin I 0.07  Routine UA:  08-Mar-13 11:40    Color (UA) Yellow   Clarity (UA) Clear   Glucose (UA) Negative   Bilirubin (UA) Negative   Ketones (UA) Negative   Specific Gravity (UA) 1.014   Blood (UA) 1+   pH (UA) 6.0   Protein (  UA) Negative   Nitrite (UA) Negative   Leukocyte Esterase (UA) Negative   RBC (UA) 1 /HPF   WBC (UA) <1 /HPF   Mucous (UA) PRESENT  Cardiac:  08-Mar-13 19:00    CK, Total 213   CPK-MB, Serum 3.1   Troponin I 0.07   Assessment/Plan:  Assessment/Plan:   Plan Atrial Fibrillation:  Now back in NSR.  We will arrange an outpatient follow up appt and echocardiogram.  OK to discharge.   Electronic Signatures: Minus Breeding (MD)  (Signed 09-Mar-13 16:05)  Authored: Chief Complaint, VITAL SIGNS/ANCILLARY NOTES, Brief Assessment, Lab Results, Assessment/Plan   Last Updated: 09-Mar-13 16:05 by Minus Breeding (MD)

## 2014-11-12 NOTE — Discharge Summary (Signed)
PATIENT NAME:  [name], [name] MR#:  [id number] DATE OF BIRTH:  11-08-1945  DATE OF ADMISSION:  09/26/2011 DATE OF DISCHARGE:  09/27/2011  PRIMARY CARE PHYSICIAN: [facility]   REASON FOR ADMISSION: Heart palpitations, generalized weakness.   DISCHARGE DIAGNOSES:  1. Atrial fibrillation with rapid ventricular response.  2. Elevated troponin felt to be demand ischemia and tachycardia induced rather than acute coronary syndrome. 3. Symptomatic hypotension (causing generalized weakness) felt to be medication induced.  4. Hypomagnesemia.  5. History of coronary artery disease status post coronary artery bypass graft. 6. History of hypertension. 7. History of hyperlipidemia.  8. 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 No evidence of decompensated congestive heart failure during this hospitalization.  9. History of arthritis.  10. History of coronary artery disease status post coronary artery bypass graft.  11. History of atrial fibrillation post coronary artery bypass graft.   CONSULT: Cardiology, Dr. Loralie Champagne.    DISCHARGE DISPOSITION: Home.   DISCHARGE MEDICATIONS: New medications: 1. Metoprolol 25 mg p.o. every six hours. 2. Pradaxa 150 mg p.o. b.i.d.  3. Nitrostat 0.4 mg sublingually every five minutes x3 p.r.n. chest.   DISCHARGE MEDICATIONS: Prior medications:  1. Ezetimibe 10 mg p.o. daily. 2. Atorvastatin 20 mg daily.  3. Fish oil 1000 mg p.o. daily.  4. Coenzyme Q-10 one cap p.o. daily as before.   ADDITIONAL MEDICATION INSTRUCTIONS: Do not take amlodipine, Imdur or lisinopril until or unless further advised to do so by your primary care physician and/or cardiologist.   DISCHARGE ACTIVITY: As tolerated.   DISCHARGE DIET: Low sodium, low fat, low cholesterol.   DISCHARGE INSTRUCTIONS:  1. Take medications as prescribed.  2. Return to Emergency Department for recurrence of symptoms or  for development of chest pain or shortness of breath.    FOLLOW UP INSTRUCTIONS:  1. Follow up with Dr. Rockey Situ within 1 to 2 weeks. 2. Follow up with your primary care physician at [facility] within 1 to 2 weeks.    LABORATORY, DIAGNOSTIC AND RADIOLOGICAL DATA:  Portable chest x-ray 09/26/2011: No acute cardiopulmonary abnormalities are noted.   2-D echocardiogram 60/73/7106: LV systolic function moderately reduced with ejection fraction of 35% to 45%. RV systolic function normal. Left atrial size normal. Mild MR. RV systolic pressure is elevated at 30 to 40 mmHg.  EKG on admission: Atrial fibrillation with heart rate 82 beats per minute with nonspecific ST abnormality.   Cardiac enzymes were as follows: All three troponins were 0.07 and did not up or down trend.   TSH 0.88 and normal.   LFTs within normal limits.   Renal function normal on admission and discharge.   Serum magnesium 1.7 on admission and 1.8 on the day of discharge.   All CK-MB levels were normal.   All CK levels were normal except for first minimally elevated at 243.   BRIEF HISTORY/HOSPITAL COURSE: Patient is a 69 year old male with past medical history of hypertension, hyperlipidemia, arthritis, coronary artery disease status post coronary artery bypass graft and history of postoperative atrial fibrillation who presented to the Emergency Department secondary to heart palpitations and generalized weakness. Please see dictated admission history and physical for pertinent details surrounding the onset of this hospitalization and please see below for further details.  1. Atrial fibrillation with RVR-Presenting with heart palpitations. Initial EKG patient's rate was controlled but he did have some tachycardic spells during this hospitalization. Heart rate was actually 160 when EMS arrived  at the patient's house. He was given one dose of IV Cardizem and thereafter became fairly hypotensive, however, his heart rate  did improve. Pulse was 87 upon initial evaluation by hospitalist. Patient was admitted to the telemetry unit for further evaluation. He was maintained on metoprolol, however, at an increased frequency and was changed to q.6 hours rather than q.12 as advised per cardiology and patient has spontaneously converted to normal sinus rhythm. Once his heart rate became controlled his heart palpitations resolved. Patient was advised to continue to take aspirin for stroke prophylaxis, however, cardiology also recommended adding Pradaxa. Patient was noted to have abnormal cardiac enzymes and minimally elevated troponins which did not up or down trend. Cardiology felt that patient's cardiac enzymes were elevated secondary to rapid atrial fibrillation and tachycardia rather than acute coronary syndrome. Cardiology recommends continuation of medical management of patient's coronary artery disease with aspirin, Pradaxa, beta blocker and statin therapy. Per cardiology patient can obtain an outpatient Myoview rather than in the inpatient setting. His Imdur and ACE inhibitor have been stopped given hypotension. Patient underwent echocardiogram which revealed moderate systolic dysfunction with ejection fraction 35% to 45% with probable chronic systolic dysfunction for which patient will follow up with cardiology as an outpatient. He had no evidence of decompensated congestive heart failure during this hospitalization and was not volume overloaded. There is no role for diuretics. ACE inhibitor was stopped because of hypotension as above with symptoms being generalized weakness. He will need close outpatient follow up with cardiology and was advised to adhere to low sodium diet. He may have a component of ischemic cardiomyopathy and will need further outpatient evaluation with Myoview per cardiology. In the meanwhile he will continue medical management of his coronary artery disease as listed above.  2. Hypotension-Felt to be  medication induced and patient was symptomatic with generalized weakness on admission. He blood pressure also fell with IV Cardizem which was administered when he was noted to have rapid atrial fibrillation. Per cardiology it was not felt that his hypotension was unstable atrial fibrillation and it was felt to be medication induced. His Norvasc, Imdur and lisinopril have been stopped. He will only be on metoprolol for both heart rate and blood pressure control and after adjusting his regimen his heart rate and blood pressure normalized and are at goal at the time of discharge and patient is normotensive with normal heart rate. It is was felt that if Norvasc, Imdur and lisinopril were restarted it would precipitate and cause patient to redevelop hypotension with weakness and therefore patient was advised to not take these medications for now until he follows up with his primary care physician and cardiologist. Generalized weakness resolved with resolution of his hypotension.  3. Hyperlipidemia-Patient to continue Lipitor and Zetia.  4. Hypomagnesemia-With serum magnesium level minimally low and low magnesium resolved after a dose of magnesium oxide.  5. On 09/27/2011 patient was hemodynamically stable and was without any heart palpitations and his atrial fibrillation had converted to normal sinus rhythm and his hypotension had resolved and he was felt to be stable for discharge home with close outpatient follow up to which the patient was agreeable and it was also felt to be safe to have patient discharged home per cardiology.   TIME SPENT ON DISCHARGE: Greater than 30 minutes.   ____________________________ Romie Jumper, MD knl:cms D: 10/01/2011 21:39:21 ET T: 10/02/2011 13:12:24 ET JOB#: [id number]  cc: Romie Jumper, MD, <Dictator> [name], MD [place]  N Fiora Weill MD ELECTRONICALLY SIGNED 10/07/2011 1:17

## 2014-11-12 NOTE — Consult Note (Signed)
PATIENT NAME:  [name], [name] MR#:  [id number] DATE OF BIRTH:  February 16, 1946  DATE OF CONSULTATION:  09/26/2011  REFERRING PHYSICIAN:   CONSULTING PHYSICIAN:  Larey Dresser, MD  HISTORY OF PRESENT ILLNESS: This is a 69 year old with history of coronary artery disease, status post coronary artery bypass grafting and postoperative atrial fibrillation at the time of bypass in 2010 who was admitted this morning through the Emergency Room at [facility] with atrial fibrillation and rapid ventricular response. Patient states that he has felt tired for the last couple of days. This morning he woke up feeling his heart beating fast and irregularly. He was short of breath and fatigued. He came to the Emergency Room where he was noted to be in atrial fibrillation with rapid ventricular response. His blood pressure was initially low. He was rate controlled in the Emergency Room and his heart rate went down into the 90s w with his blood pressure rising to a systolic in the 263Z. The patient has had no chest pain at all. His troponin was checked in the Emergency Room was found to be mildly elevated at 0.07. At baseline the patient has been doing well in general. Lately he does not get short of breath walking on flat ground normally, he does not have exertional chest pain either. He recently retired from the [employer]. As noted already he had postoperative atrial fibrillation after his bypass surgery; however, this resolved during that hospitalization and there had been no symptomatic recurrence until today. He has not been on anticoagulation.   PAST MEDICAL HISTORY:  1. Coronary artery disease, status post coronary artery bypass grafting in 2010, last heart catheterization was in March 2011 by Dr. Fletcher Anon. Patient had a patent LIMA to the LAD, his saphenous vein graft to the PDA and saphenous vein graft to the obtuse marginal were both occluded. He had good left and right collaterals. His native  circumflex system was open. Ejection fraction was 55% by catheterization.  2. Hypertension.  3. Hyperlipidemia.  4. Atrial fibrillation. The initial episode was postoperative after his bypass surgery in 2010. This admission his episode is the first episode since.  SOCIAL HISTORY: Patient is married. He is a nonsmoker. He lives in [place]. He recently retired from the [employer].   FAMILY HISTORY: Coronary artery disease.   REVIEW OF SYSTEMS: All systems were reviewed and were negative except as noted in the history of present illness.   LABORATORY, DIAGNOSTIC AND RADIOLOGICAL DATA: BNP 282, potassium 4.1, creatinine 0.84, troponin 0.07, TSH normal, hematocrit 40. Chest x-ray shows no pulmonary edema. No pneumonia. EKG is not available; not been able to find the one from the ER; however, telemetry shows sinus rhythm with a rate in the 90s currently.   MEDICATIONS CURRENTLY:  1. Atorvastatin 20 mg daily. 2. Pradaxa 150 mg b.i.d.  3. Zetia 10 mg daily.  4. Metoprolol 25 mg b.i.d.   PHYSICAL EXAMINATION:  VITAL SIGNS: Blood pressure currently 115/66, pulse is in the 90s and irregular, temperature 97.3, oxygen saturation 97% on 2 liters nasal cannula.   GENERAL: This is a well-developed male in no apparent distress.   NEUROLOGIC: Alert and oriented x3. Normal affect.   NECK: There is no thyromegaly or thyroid nodules. JVP is mildly elevated at 8 cm of water.   LUNGS: Clear to auscultation bilaterally. Normal respiratory effort.   CARDIOVASCULAR: Heart irregular, S1, S2. No S3, no S4. There is no murmur. There is no peripheral edema. There is no carotid  bruit.   ABDOMEN: Soft, nontender, no hepatosplenomegaly. Normal bowel sounds.   EXTREMITIES: No clubbing or cyanosis.   SKIN: Normal exam.   HEENT: Normal exam.   IMPRESSION AND RECOMMENDATIONS: This is a 69 year old with history of coronary artery disease, status post coronary bypass grafting and postoperative atrial  fibrillation who presented this morning with atrial fibrillation with rapid ventricular response.  1. Atrial fibrillation. I agree with the initiation of Pradaxa and rate control for now with metoprolol. I am going to go ahead and increase the metoprolol to 25 mg p.o. q.6 hours. If the patient remains in atrial fibrillation plan for TEE guided cardioversion after five doses of Pradaxa if he remains in the hospital. Alternatively, if the patient feels well and goes home over the weekend this can be arranged as an outpatient next week. We will need to get an echocardiogram.  2. Coronary artery disease. Patient has had a mild elevation in troponin with no chest pain. I suspect that this may be demand ischemia in the setting of atrial fibrillation with rapid ventricular response. Will plan on cycling the troponin to make sure there is no significant upper trend. If troponin remains stable or falls would simply plan and outpatient Myoview.   ____________________________ Larey Dresser, MD dsm:cms D: 09/26/2011 17:41:13 ET T: 09/27/2011 10:26:32 ET JOB#: [id number]  cc: Larey Dresser, MD, <Dictator> Larey Dresser MD ELECTRONICALLY SIGNED 10/10/2011 10:48

## 2014-11-12 NOTE — Consult Note (Signed)
Brief Consult Note: Diagnosis: atrial fibrillation.   Patient was seen by consultant.   Consult note dictated.   Recommend further assessment or treatment.   Comments: 69 yo with history of CAD s/p CABG and post-op atrial fibrillation presented with am with atrial fibrillation with RVR.  He has felt tired the last couple of days.  This am, he woke up feeling his heart beating irregularly.  He was short of breath and fatigued.  Family brought him to the ER where he was noted to be in atrial fibrillation with RVR with low BP.  He was rate-controlled in the ER and HR has gone down with BP rising.  He has had no chest pain. TnI was mildly elevated at 0.07.  1. Atrial fibrillation: Agree with Pradaxa initiation and rate control for now with metoprolol, would increase to 25 mg q6 hours.  If he remains in atrial fibrillation, would plan TEE-guided cardioversion after 5 doses of Pradaxa if he remains in hospital (or can arrange as an outpatient next week).  Get echocardiogram.  2. CAD: Mild elevation in TnI with no chest pain. Suspect this is demand ischemia with atrial fibrillation/RVR.  Would cycle troponin to make sure there is no significant trend upwards. If TnI remains stable or falls, would plan outpatient myoview.  Electronic Signatures: Larey Dresser (MD)  (Signed 08-Mar-13 14:01)  Authored: Brief Consult Note   Last Updated: 08-Mar-13 14:01 by Larey Dresser (MD)

## 2014-12-18 ENCOUNTER — Other Ambulatory Visit: Payer: Self-pay | Admitting: *Deleted

## 2014-12-18 DIAGNOSIS — C9110 Chronic lymphocytic leukemia of B-cell type not having achieved remission: Secondary | ICD-10-CM

## 2014-12-19 ENCOUNTER — Inpatient Hospital Stay: Payer: Medicare Other

## 2014-12-19 ENCOUNTER — Encounter (INDEPENDENT_AMBULATORY_CARE_PROVIDER_SITE_OTHER): Payer: Self-pay

## 2014-12-19 ENCOUNTER — Inpatient Hospital Stay: Payer: Medicare Other | Attending: Internal Medicine | Admitting: Internal Medicine

## 2014-12-19 VITALS — BP 133/83 | HR 58 | Temp 97.1°F | Ht 68.98 in | Wt 219.4 lb

## 2014-12-19 DIAGNOSIS — D696 Thrombocytopenia, unspecified: Secondary | ICD-10-CM | POA: Insufficient documentation

## 2014-12-19 DIAGNOSIS — J449 Chronic obstructive pulmonary disease, unspecified: Secondary | ICD-10-CM | POA: Diagnosis not present

## 2014-12-19 DIAGNOSIS — D649 Anemia, unspecified: Secondary | ICD-10-CM | POA: Diagnosis not present

## 2014-12-19 DIAGNOSIS — R161 Splenomegaly, not elsewhere classified: Secondary | ICD-10-CM | POA: Insufficient documentation

## 2014-12-19 DIAGNOSIS — I10 Essential (primary) hypertension: Secondary | ICD-10-CM | POA: Diagnosis not present

## 2014-12-19 DIAGNOSIS — Z5111 Encounter for antineoplastic chemotherapy: Secondary | ICD-10-CM | POA: Insufficient documentation

## 2014-12-19 DIAGNOSIS — I2510 Atherosclerotic heart disease of native coronary artery without angina pectoris: Secondary | ICD-10-CM | POA: Diagnosis not present

## 2014-12-19 DIAGNOSIS — Z79899 Other long term (current) drug therapy: Secondary | ICD-10-CM | POA: Insufficient documentation

## 2014-12-19 DIAGNOSIS — Z9049 Acquired absence of other specified parts of digestive tract: Secondary | ICD-10-CM | POA: Insufficient documentation

## 2014-12-19 DIAGNOSIS — E785 Hyperlipidemia, unspecified: Secondary | ICD-10-CM | POA: Diagnosis not present

## 2014-12-19 DIAGNOSIS — M129 Arthropathy, unspecified: Secondary | ICD-10-CM | POA: Insufficient documentation

## 2014-12-19 DIAGNOSIS — Z87891 Personal history of nicotine dependence: Secondary | ICD-10-CM | POA: Insufficient documentation

## 2014-12-19 DIAGNOSIS — I4891 Unspecified atrial fibrillation: Secondary | ICD-10-CM | POA: Diagnosis not present

## 2014-12-19 DIAGNOSIS — C9110 Chronic lymphocytic leukemia of B-cell type not having achieved remission: Secondary | ICD-10-CM | POA: Insufficient documentation

## 2014-12-19 DIAGNOSIS — E780 Pure hypercholesterolemia: Secondary | ICD-10-CM | POA: Diagnosis not present

## 2014-12-19 LAB — CBC WITH DIFFERENTIAL/PLATELET
BASOS PCT: 1 %
Basophils Absolute: 0.1 10*3/uL (ref 0–0.1)
Eosinophils Absolute: 0.2 10*3/uL (ref 0–0.7)
Eosinophils Relative: 4 %
HCT: 40 % (ref 40.0–52.0)
HEMOGLOBIN: 13.4 g/dL (ref 13.0–18.0)
LYMPHS ABS: 1.2 10*3/uL (ref 1.0–3.6)
Lymphocytes Relative: 20 %
MCH: 31.6 pg (ref 26.0–34.0)
MCHC: 33.6 g/dL (ref 32.0–36.0)
MCV: 94.1 fL (ref 80.0–100.0)
MONOS PCT: 12 %
Monocytes Absolute: 0.7 10*3/uL (ref 0.2–1.0)
NEUTROS ABS: 3.7 10*3/uL (ref 1.4–6.5)
NEUTROS PCT: 63 %
Platelets: 138 10*3/uL — ABNORMAL LOW (ref 150–440)
RBC: 4.24 MIL/uL — ABNORMAL LOW (ref 4.40–5.90)
RDW: 14.7 % — ABNORMAL HIGH (ref 11.5–14.5)
WBC: 5.8 10*3/uL (ref 3.8–10.6)

## 2014-12-19 LAB — CREATININE, SERUM
Creatinine, Ser: 0.69 mg/dL (ref 0.61–1.24)
GFR calc non Af Amer: >60 mL/min (ref >60)

## 2014-12-19 MED ORDER — DEXAMETHASONE SODIUM PHOSPHATE 10 MG/ML IJ SOLN
10.0000 mg | Freq: Once | INTRAMUSCULAR | Status: AC
  Administered 2014-12-19: 10 mg via INTRAVENOUS
  Filled 2014-12-19: qty 1

## 2014-12-19 MED ORDER — ACETAMINOPHEN 325 MG PO TABS
650.0000 mg | ORAL_TABLET | Freq: Once | ORAL | Status: AC
  Administered 2014-12-19: 650 mg via ORAL
  Filled 2014-12-19: qty 2

## 2014-12-19 MED ORDER — DIPHENHYDRAMINE HCL 50 MG/ML IJ SOLN
25.0000 mg | Freq: Once | INTRAMUSCULAR | Status: AC
Start: 2014-12-19 — End: 2014-12-19
  Administered 2014-12-19: 25 mg via INTRAVENOUS
  Filled 2014-12-19: qty 1

## 2014-12-19 MED ORDER — SODIUM CHLORIDE 0.9 % IV SOLN
375.0000 mg/m2 | Freq: Once | INTRAVENOUS | Status: AC
  Administered 2014-12-19: 800 mg via INTRAVENOUS
  Filled 2014-12-19: qty 70

## 2014-12-19 MED ORDER — SODIUM CHLORIDE 0.9 % IV SOLN
Freq: Once | INTRAVENOUS | Status: AC
  Administered 2014-12-19: 12:00:00 via INTRAVENOUS
  Filled 2014-12-19: qty 1000

## 2014-12-20 ENCOUNTER — Ambulatory Visit (INDEPENDENT_AMBULATORY_CARE_PROVIDER_SITE_OTHER): Payer: Medicare Other | Admitting: Cardiovascular Disease

## 2014-12-20 ENCOUNTER — Encounter: Payer: Self-pay | Admitting: Cardiovascular Disease

## 2014-12-20 VITALS — BP 140/80 | HR 77 | Ht 69.0 in | Wt 220.5 lb

## 2014-12-20 DIAGNOSIS — E7800 Pure hypercholesterolemia, unspecified: Secondary | ICD-10-CM

## 2014-12-20 DIAGNOSIS — I25810 Atherosclerosis of coronary artery bypass graft(s) without angina pectoris: Secondary | ICD-10-CM

## 2014-12-20 DIAGNOSIS — C9110 Chronic lymphocytic leukemia of B-cell type not having achieved remission: Secondary | ICD-10-CM

## 2014-12-20 DIAGNOSIS — I4891 Unspecified atrial fibrillation: Secondary | ICD-10-CM

## 2014-12-20 DIAGNOSIS — I10 Essential (primary) hypertension: Secondary | ICD-10-CM | POA: Diagnosis not present

## 2014-12-20 MED ORDER — AMIODARONE HCL 200 MG PO TABS: 200.0000 mg | ORAL_TABLET | Freq: Two times a day (BID) | ORAL | Status: DC | PRN

## 2014-12-20 MED ORDER — METOPROLOL TARTRATE 50 MG PO TABS: 50.0000 mg | ORAL_TABLET | Freq: Two times a day (BID) | ORAL | Status: DC

## 2014-12-20 NOTE — Patient Instructions (Addendum)
You are doing well.  Please increase the metoprolol up to 50 mg twice a day  If you have atrial fibrillation, Please take extra metoprolol (25 mg x 1) Also take amiodarone x 2 pill  Please call us if you have new issues that need to be addressed before your next appt.  Your physician wants you to follow-up in: 6 months.  You will receive a reminder letter in the mail two months in advance. If you don't receive a letter, please call our office to schedule the follow-up appointment.

## 2014-12-20 NOTE — Progress Notes (Signed)
Patient ID: [name], male    DOB: 1946/02/18, 69 y.o.   MRN: [id number]  HPI Comments: [name] is a 69 year old gentleman with a history of smoking, coronary artery disease, cabg, Last catheterization March 2011 with occluded vein graft 2, patent LIMA, collaterals from left to right, atrial fibrillation March 2013, who presents for follow-up of his coronary artery disease and atrial fibrillation. He Smoked for 40 years, quit in 2009 He has a history of CLL  In follow up today, he reports developing atrial fibrillation on 10/11/2014. Last episode was several years ago per our notes Notes indicate he was borderline hypotensive, heart rate 150 He went to the emergency room on the recommendation of our office. He was given extra metoprolol in the ER, eventually converted back to normal sinus rhythm. By his report, he had a conversion pause though details are unavailable. Since then denies having any further episodes.   Denies any significant chest pain on exertion, no shortness of breath with activity, no leg edema, PND or orthopnea CLL, followed by Dr. Ma Hillock. He has completed his chemotherapy Previously has been very active, building a barn, tool shed, taking care of animals  EKG on today's visit shows normal sinus rhythm with heart rate 70 bpm, no significant ST or T-wave changes  Other past medical history  some benefits from the [place] for agent orange exposure and his cancer   total cholesterol 137, LDL 86, earlier in the year  chronic leg pain since he was young.  Previous leg pain with Crestor   admitted to the hospital on September 26 2011 for malaise, appearing pale, irregular heart rhythm and noted to be in atrial fibrillation. He was given medication for rhythm control and he converted to normal sinus rhythm later that evening. No longer on anticoagulation. He has been maintaining normal sinus rhythm No symptoms concerning for atrial fibrillation since that time  Echocardiogram  showed ejection fraction 35-45%, mildly elevated right ventricular systolic pressures estimated at 30-40 mm mercury.  Previous catheterization showing occluded LAD in the mid vessel, 60% left main, 99% distal RCA who was sent to [facility] in October 2010 for bypass surgery. He received 3 vessel bypass by Dr.Gerhardt with a LIMA to the LAD, vein graft to the OM1 and vein graft to the PDA, with subsequent cardiac catheter March 2011 for chest discomfort showing occluded vein grafts x2 with patent LIMA. He has collateral vessels from left to right. Ejection fraction 55% mild anterolateral hypokinesis.   Outpatient Encounter Prescriptions as of 12/20/2014  Medication Sig  . amLODipine (NORVASC) 10 MG tablet Take 0.5 tablets (5 mg total) by mouth daily.  Marland Kitchen apixaban (ELIQUIS) 5 MG TABS tablet Take 1 tablet (5 mg total) by mouth 2 (two) times daily.  Marland Kitchen atorvastatin (LIPITOR) 80 MG tablet Take 0.5 tablets (40 mg total) by mouth daily.  . budesonide-formoterol (SYMBICORT) 160-4.5 MCG/ACT inhaler Inhale 2 puffs into the lungs 2 (two) times daily as needed.  . Coenzyme Q10 (CO Q-10) 100 MG CAPS Take 100 mg by mouth daily.  . metoprolol tartrate (LOPRESSOR) 50 MG tablet Take 1 tablet (50 mg total) by mouth 2 (two) times daily.  . Multiple Vitamin (MULTI VITAMIN MENS PO) Take 1 tablet by mouth daily.  . nitroGLYCERIN (NITROSTAT) 0.4 MG SL tablet Place 0.4 mg under the tongue every 5 (five) minutes as needed.  . ondansetron (ZOFRAN) 8 MG tablet Take by mouth every 8 (eight) hours as needed for nausea or vomiting.  Marland Kitchen  promethazine (PHENERGAN) 25 MG tablet Take 25 mg by mouth every 6 (six) hours as needed for nausea or vomiting.  . tiotropium (SPIRIVA HANDIHALER) 18 MCG inhalation capsule Place 1 capsule (18 mcg total) into inhaler and inhale daily.  . traMADol (ULTRAM) 50 MG tablet Take 50 mg by mouth every 6 (six) hours as needed.   . [DISCONTINUED] metoprolol tartrate (LOPRESSOR) 25 MG tablet Take 1 tablet (25  mg total) by mouth 2 (two) times daily.  Marland Kitchen amiodarone (PACERONE) 200 MG tablet Take 1 tablet (200 mg total) by mouth 2 (two) times daily as needed.   No facility-administered encounter medications on file as of 12/20/2014.     Past Medical History  Diagnosis Date  . Coronary artery disease     a. 04/2009 CABG x 3 (LIMA->LAD, VG->OM1, VG->PDA); b. 09/2009 Caht: occluded VG x 2 w/ patent LIMA and L->R collats. EF 55%, mild antlat HK; c. 10/2011 MV: EF 53%, no isch/infarct-->low risk.  . Pure hypercholesterolemia   . Hypertension   . Atrial fibrillation     a. Dx 2013, recurred 02/2014, CHA2DS2VASc = 3 -->placed on Eliquis; b. 02/2014 Echo: EF 50-55%, mid and apical anterior septum and mid and apical inf septum are abnl, mild to mod Ao sclerosis w/o AS.  Marland Kitchen History of arthritis   . Arthritis   . Chicken pox   . Rheumatic fever   . COPD (chronic obstructive pulmonary disease)   . Arthritis   . Chronic lymphocytic leukemia     a. Dx 02/2014.   Social history  reports that he quit smoking about 8 years ago. His smoking use included Cigarettes. He has a 40 pack-year smoking history. He has never used smokeless tobacco. He reports that he drinks about 0.5 oz of alcohol per week. He reports that he does not use illicit drugs.   Review of Systems  Constitutional: Negative.   Eyes: Negative.   Respiratory: Negative.   Cardiovascular: Positive for palpitations.  Gastrointestinal: Negative.   Endocrine: Negative.   Musculoskeletal: Positive for arthralgias and gait problem.  Neurological: Negative.   Psychiatric/Behavioral: Negative.   All other systems reviewed and are negative.  BP 140/80 mmHg  Pulse 77  Ht 5\' 9"  (1.753 m)  Wt 220 lb 8 oz (100.018 kg)  BMI 32.55 kg/m2  Physical Exam  Constitutional: He is oriented to person, place, and time. He appears well-developed and well-nourished.  HENT:  Head: Normocephalic.  Nose: Nose normal.   Mouth/Throat: Oropharynx is clear and moist.  Eyes: Conjunctivae are normal. Pupils are equal, round, and reactive to light.  Neck: Normal range of motion. Neck supple. No JVD present.  Cardiovascular: Normal rate, regular rhythm, S1 normal, S2 normal, normal heart sounds and intact distal pulses.  Exam reveals no gallop and no friction rub.   No murmur heard. Pulmonary/Chest: Effort normal and breath sounds normal. No respiratory distress. He has no wheezes. He has no rales. He exhibits no tenderness.  Abdominal: Soft. Bowel sounds are normal. He exhibits no distension. There is no tenderness.  Musculoskeletal: Normal range of motion. He exhibits no edema or tenderness.  Lymphadenopathy:    He has no cervical adenopathy.  Neurological: He is alert and oriented to person, place, and time. Coordination normal.  Skin: Skin is warm and dry. No rash noted. No erythema.  Psychiatric: He has a normal mood and affect. His behavior is normal. Judgment and thought content normal.      Assessment and Plan   Nursing note  and vitals reviewed.

## 2014-12-20 NOTE — Assessment & Plan Note (Signed)
Currently with no symptoms of angina. No further workup at this time. Continue current medication regimen. 

## 2014-12-20 NOTE — Assessment & Plan Note (Signed)
Blood pressure is well controlled on today's visit. No changes made to the medications. 

## 2014-12-20 NOTE — Assessment & Plan Note (Signed)
Cholesterol is at goal on the current lipid regimen. No changes to the medications were made.  

## 2014-12-20 NOTE — Assessment & Plan Note (Signed)
We have recommended he increase his metoprolol up to 50 mg of metoprolol tartrate twice a day We have also given him amiodarone to take 400 mg to take as needed for breakthrough atrial fibrillation If he starts to have frequent episodes of atrial fibrillation, will likely start him on amiodarone on daily basis. Recommended he call our office for any additional episodes. We'll only to monitor him closely given possible conversion pause

## 2014-12-20 NOTE — Assessment & Plan Note (Signed)
Followed by Dr. Ma Hillock. Completed chemotherapy

## 2014-12-21 NOTE — Progress Notes (Signed)
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 Stage 2 given leukocytosis, lymphadenopathy and splenomegaly. Also had peritoneal nodularity which improved on CT scan after chemotherapy. Sept 2015 - Rapidly progressive bulky lymphadenopathy. 05/10/14 - Positive for ZAP-70 and CD38.  Completed chemotherapy with Treanda/Rituxan x 6 cycles (05/10/14 - 09/26/14). Started maintenance Rituxan on 10/24/14.    HISTORY OF PRESENT ILLNESS:  Patient returns for continued oncology followup and plan second dose of maintenance Rituxan. States that he is doing well, and denies new complaints. No fevers, chills or night sweats. Eating much better. No nausea or vomiting. No mouth sores. Denies feeling recurrent lymph node masses on self-exam. No new dyspnea, cough or hemoptysis. No new pain issues, 0/10. No new mood disturbances.  REVIEW OF SYSTEMS:   ROS As in HPI above. In addition, no new headaches or focal weakness.  No new mood disturbances. No  sore throat, cough, shortness of breath, sputum, hemoptysis or chest pain. No dizziness or palpitation. No abdominal pain, constipation, diarrhea, dysuria or hematuria. No new skin rash or bleeding symptoms. No new paresthesias in extremities. PS ECOG 0.  PAST MEDICAL HISTORY: Past Medical History  Diagnosis Date  . Coronary artery disease     a. 04/2009 CABG x 3 (LIMA->LAD, VG->OM1, VG->PDA);  b. 09/2009 Caht: occluded VG x 2 w/ patent LIMA and L->R collats. EF 55%, mild antlat HK;  c. 10/2011 MV: EF 53%, no isch/infarct-->low risk.  . Pure hypercholesterolemia   . Hypertension   . Atrial fibrillation     a. Dx 2013, recurred  02/2014, CHA2DS2VASc = 3 -->placed on Eliquis;  b. 02/2014 Echo: EF 50-55%, mid and apical anterior septum and mid and apical inf septum are abnl, mild to mod Ao sclerosis w/o AS.  Marland Kitchen History of arthritis   . Arthritis   . Chicken pox   . Rheumatic fever   . COPD (chronic obstructive pulmonary disease)   . Arthritis   . Chronic lymphocytic leukemia     a. Dx 02/2014.  Marland Kitchen Coronary artery disease     Post CABG 2010, subsequent catheterization 2011 revealed occluded vein grafts X2  . 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 Cardiac catheterization  2011    Dr Fletcher Anon  . Cholecystectomy    . Appendectomy  1988  . Tonsillectomy and adenoidectomy  1956  . Coronary artery bypass graft  2010    3v  . Hernia repair      abdominal  . Hernia repair      FAMILY HISTORY: Family History  Problem Relation Age of Onset  . Coronary artery disease      family history  . Heart disease Mother   Remarkable for heart disease. Denies hematological disorders.  ADVANCED DIRECTIVES:  Yes  SOCIAL HISTORY: History  Substance Use Topics  . Smoking status: Former Smoker -- 1.00 packs/day for 40 years    Types: Cigarettes    Quit date: 07/21/2006  . Smokeless tobacco: Never Used  . Alcohol Use: 0.5 oz/week    1 Standard drinks or equivalent per week     Comment: occasionally  Ex-smoker, quit many years ago. Denies alcohol or recreational drug usage. Physically active.  No Known Allergies  Current Outpatient  Prescriptions  Medication Sig Dispense Refill  . amLODipine (NORVASC) 10 MG tablet Take 0.5 tablets (5 mg total) by mouth daily. 30 tablet 6  . apixaban (ELIQUIS) 5 MG TABS tablet Take 1 tablet (5 mg total) by mouth 2 (two) times daily. 60 tablet 6  . atorvastatin (LIPITOR) 80 MG tablet Take 0.5 tablets (40 mg total) by mouth daily. 90 tablet 6  . budesonide-formoterol (SYMBICORT) 160-4.5 MCG/ACT inhaler Inhale 2 puffs into the lungs 2 (two) times daily as needed. 1 Inhaler 6  . Coenzyme Q10 (CO Q-10) 100 MG CAPS Take 100 mg by mouth daily.    . Multiple Vitamin (MULTI VITAMIN MENS PO) Take 1 tablet by mouth daily.    . nitroGLYCERIN (NITROSTAT) 0.4 MG SL tablet Place 0.4 mg under the tongue every 5 (five) minutes as needed.    . ondansetron (ZOFRAN) 8 MG tablet Take by mouth every 8 (eight) hours as needed for nausea or vomiting.    . promethazine (PHENERGAN) 25 MG tablet Take 25 mg by mouth every 6 (six) hours as needed for nausea or vomiting.    . tiotropium (SPIRIVA HANDIHALER) 18 MCG inhalation capsule Place 1 capsule (18 mcg total) into inhaler and inhale daily. 30 capsule 6  . traMADol (ULTRAM) 50 MG tablet Take 50 mg by mouth every 6 (six) hours as needed.     Marland Kitchen amiodarone (PACERONE) 200 MG tablet Take 1 tablet (200 mg total) by mouth 2 (two) times daily as needed. 60 tablet 3  . metoprolol tartrate (LOPRESSOR) 50 MG tablet Take 1 tablet (50 mg total) by mouth 2 (two) times daily. 180 tablet 3  . [DISCONTINUED] isosorbide mononitrate (IMDUR) 30 MG 24 hr tablet Take 1 tablet (30 mg total) by mouth daily. 30 tablet 6   No current facility-administered medications for this visit.    OBJECTIVE: Filed Vitals:   12/19/14 0955  BP: 133/83  Pulse: 58  Temp: 97.1 F (36.2 C)     Body mass index is 32.42 kg/(m^2).    ECOG FS:0 - Asymptomatic  GENERAL: Patient is alert and oriented and in no acute distress. There is no icterus. HEENT: EOMs intact. Oral exam negative for thrush or lesions.  No cervical lymphadenopathy. CVS: S1S2, regular LUNGS: Bilaterally clear to auscultation, no rhonchi. ABDOMEN: Soft, nontender. No hepatosplenomegaly clinically.  NEURO: grossly nonfocal, cranial nerves are intact.  EXTREMITIES: No pedal edema. LYMPHATICS: No palpable adenopathy in axillary or inguinal areas.   LAB RESULTS:     Component Value Date/Time   NA 139 10/11/2014 1800   NA 141 07/06/2014 0804   K 3.3* 10/11/2014 1800   K 4.5 07/06/2014 0804   CL 106 10/11/2014 1800   CL 108 07/06/2014 0804   CO2 27 10/11/2014 1800   CO2 24 07/06/2014 0804   GLUCOSE 107* 10/11/2014 1800   GLUCOSE 123* 07/06/2014 0804   BUN 15 10/11/2014 1800   BUN 13 07/06/2014 0804   CREATININE 0.69 12/19/2014 0937   CREATININE 0.89 10/11/2014 1800   CALCIUM  8.8* 10/11/2014 1800   CALCIUM 8.9 07/06/2014 0804   PROT 6.4* 10/11/2014 1800   PROT 6.2 07/06/2014 0804   PROT 6.9 09/09/2013 1151   ALBUMIN 4.1 10/11/2014 1800   ALBUMIN 4.0 07/06/2014 0804   AST 23 10/11/2014 1800   AST 15 07/06/2014 0804   ALT 22 10/11/2014 1800   ALT 21 07/06/2014 0804   ALKPHOS 61 10/11/2014 1800   ALKPHOS 64 07/06/2014 0804   BILITOT 0.8 07/06/2014 0804   GFRNONAA >60 12/19/2014 0937   GFRNONAA >60 10/11/2014 1800   GFRAA >60 12/19/2014 0937   GFRAA >60 10/11/2014 1800   Lab Results  Component Value Date   WBC 5.8 12/19/2014   NEUTROABS 3.7 12/19/2014   HGB 13.4 12/19/2014   HCT 40.0 12/19/2014   MCV 94.1 12/19/2014   PLT 138* 12/19/2014     STUDIES: 03/07/14 - Peripheral blood Flow Cytometry: Interpretation: The phenotype is most consistent with a diagnosis of CLL/SLL), CD38 indeterminate. A monoclonal B cell population is detected with kappa light chain restriction. The monoclonal B cells variably express CD19, CD20, CD11c, CD23, CD38(27%),  and aberrant CD5. There is no loss of, or aberrant expression of, the pan T cell antigens to suggest a neoplastic T cell process. CD4:CD8 ratio 1.1 Gamma-delta (GD)  T-cells are relatively increased, accounting for approximately 10% of T cells. Typically GD T-cells account for 1-3% of the T cell population. Increased GD T-cells may be seen in association with infectious processes as well as certain neoplastic processes. No circulating blasts are detected. There is no immunophenotypic  evidence of abnormal myeloid maturation. Viability 96%. Abnormal cell population: present-83% of total cells. Phenotype Chart:  CD2       (-)            CD3       (-) CD4       (-)            CD5       (+) CD7       (-)            CD8       (-) CD10      (-)            CD11b     (-) CD11c     (+)            CD13      (-) CD14      (-)            CD15      (-) CD16      (-)            CD19      (+) CD20      (+) Dim        CD22      (-) CD23      (+) Bright     CD33      (-) CD34      (-)            CD38      See Text CD45      (+)            CD56      (-) CD57      (-)            CD103     (-) CD117     (-)            alpha-betaIM**  gamma-deltIM**           FMC-7     (-) HLA-DR    (+)            KAPPA     (+) LAMBDA    (-)            CD64      (-).  04/18/14 - CT scan of chest/abdomen/pelvis. IMPRESSION: 1. Adenopathy is identified within the chest abdomen and pelvis compatible with lymphoma.  2. Splenomegaly.  3. There are several soft tissue attenuating nodules along the undersurface of the ventral abdominal wall which may indicate peritoneal involvement by tumor.  4. Atherosclerotic disease. Ectatic abdominal aorta at risk for aneurysm development. Recommend follow up by [place] in 5 years. This recommendation follows ACR consensus guidelines.  07/18/14 - CT scan of chest/abdomen/pelvis. IMPRESSION: 1. Interval near-complete resolution of previously demonstrated extensive lymphadenopathy. There are few residual borderline lymph nodes within the axilla and upper abdomen. 2. Interval resolution of splenomegaly and peritoneal nodularity. 3. Findings are consistent with significant  response to interval therapy for CLL.   ASSESSMENT / PLAN:   1. 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 Stage 2 given leukocytosis, lymphadenopathy, and splenomegaly. Positive for ZAP-70 and CD38. Core biopsy of axillary lymph node is also consistent with CLL/SLL. 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 Stage 2 given leukocytosis, lymphadenopathy and splenomegaly. Sept 2015 - Rapidly progressive bulky lymphadenopathy. 05/10/14 - Positive for ZAP-70 and CD38. Completed chemotherapy with Treanda/Rituxan x 6 cycles (05/10/14 - 09/26/14). Started maintenance Rituxan on 10/24/14. Last CT scan of the chest, abdomen, and pelvis done on December 29 showed excellent response of extensive lymphadenopathy to chemotherapy, there were a few residual borderline lymph nodes in the axilla and upper abdomen. Splenomegaly and peritoneal nodularity had also resolved, indicating that these were also CLL  -  reviewed labs from today and d/w patient. He is doing fairly steady without any new side effects from maintenance Rituxan. Plan is to continue on current treatment regimen, will proceed with dose #2 maintenance Rituxan 375 mg/m2 today. Will get CBC at 8 weeks from now and give next dose of Rituxan 375 mg/m IV. Next MD follow up at 16 weeks with repeat labs and plan continued treatment.  2. Anemia likely secondary to CLL and recent chemotherapy - has improved. Continue to monitor.  3. Thrombocytopenia  likely secondary to CLL and recent chemotherapy - mild, no bleeding issues. Continue to monitor.   4. Nutrition - Encouraged him to continue to maintain adequate protein calorie intake. Appetite is good.  5. In between visits, patient advised to call or come to ER in case of fevers, bleeding, acute sickness or new symptoms. He is agreeable to this  plan.     CLL Stage 2.  Leia Alf, MD   12/21/2014 9:56 AM

## 2015-02-12 ENCOUNTER — Other Ambulatory Visit: Payer: Self-pay

## 2015-02-12 MED ORDER — APIXABAN 5 MG PO TABS: 5.0000 mg | ORAL_TABLET | Freq: Two times a day (BID) | ORAL | Status: DC

## 2015-02-13 ENCOUNTER — Inpatient Hospital Stay: Payer: Medicare Other | Attending: Internal Medicine

## 2015-02-13 ENCOUNTER — Inpatient Hospital Stay: Payer: Medicare Other

## 2015-02-13 VITALS — BP 128/81 | HR 51 | Temp 98.1°F | Resp 18

## 2015-02-13 DIAGNOSIS — C9110 Chronic lymphocytic leukemia of B-cell type not having achieved remission: Secondary | ICD-10-CM | POA: Insufficient documentation

## 2015-02-13 DIAGNOSIS — Z5111 Encounter for antineoplastic chemotherapy: Secondary | ICD-10-CM | POA: Diagnosis not present

## 2015-02-13 LAB — CBC WITH DIFFERENTIAL/PLATELET
BASOS ABS: 0.1 10*3/uL (ref 0–0.1)
Basophils Relative: 1 %
EOS ABS: 0.3 10*3/uL (ref 0–0.7)
Eosinophils Relative: 6 %
HCT: 38.8 % — ABNORMAL LOW (ref 40.0–52.0)
HEMOGLOBIN: 13.2 g/dL (ref 13.0–18.0)
Lymphocytes Relative: 18 %
Lymphs Abs: 1.1 10*3/uL (ref 1.0–3.6)
MCH: 31.7 pg (ref 26.0–34.0)
MCHC: 34 g/dL (ref 32.0–36.0)
MCV: 93.5 fL (ref 80.0–100.0)
MONO ABS: 0.8 10*3/uL (ref 0.2–1.0)
Monocytes Relative: 13 %
Neutro Abs: 3.8 10*3/uL (ref 1.4–6.5)
Neutrophils Relative %: 62 %
PLATELETS: 128 10*3/uL — AB (ref 150–440)
RBC: 4.15 MIL/uL — AB (ref 4.40–5.90)
RDW: 14.5 % (ref 11.5–14.5)
WBC: 6 10*3/uL (ref 3.8–10.6)

## 2015-02-13 LAB — CREATININE, SERUM
Creatinine, Ser: 0.86 mg/dL (ref 0.61–1.24)
GFR calc Af Amer: >60 mL/min (ref >60)

## 2015-02-13 MED ORDER — SODIUM CHLORIDE 0.9 % IV SOLN
Freq: Once | INTRAVENOUS | Status: AC
  Administered 2015-02-13: 10:00:00 via INTRAVENOUS
  Filled 2015-02-13: qty 1000

## 2015-02-13 MED ORDER — SODIUM CHLORIDE 0.9 % IV SOLN
375.0000 mg/m2 | Freq: Once | INTRAVENOUS | Status: AC
  Administered 2015-02-13: 800 mg via INTRAVENOUS
  Filled 2015-02-13: qty 70

## 2015-02-13 MED ORDER — DIPHENHYDRAMINE HCL 50 MG/ML IJ SOLN
25.0000 mg | Freq: Once | INTRAMUSCULAR | Status: AC
  Administered 2015-02-13: 25 mg via INTRAVENOUS
  Filled 2015-02-13: qty 1

## 2015-02-13 MED ORDER — ACETAMINOPHEN 325 MG PO TABS
650.0000 mg | ORAL_TABLET | Freq: Once | ORAL | Status: AC
  Administered 2015-02-13: 650 mg via ORAL
  Filled 2015-02-13: qty 2

## 2015-02-13 MED ORDER — DEXAMETHASONE SODIUM PHOSPHATE 100 MG/10ML IJ SOLN
10.0000 mg | Freq: Once | INTRAMUSCULAR | Status: AC
  Administered 2015-02-13: 10 mg via INTRAVENOUS
  Filled 2015-02-13: qty 1

## 2015-02-13 MED ORDER — DEXAMETHASONE SODIUM PHOSPHATE 10 MG/ML IJ SOLN: 10.0000 mg | Freq: Once | INTRAMUSCULAR | Status: DC

## 2015-03-15 ENCOUNTER — Other Ambulatory Visit: Payer: Self-pay | Admitting: Cardiovascular Disease

## 2015-04-10 ENCOUNTER — Inpatient Hospital Stay: Payer: Medicare Other

## 2015-04-10 ENCOUNTER — Ambulatory Visit: Payer: Medicare Other | Admitting: Internal Medicine

## 2015-04-10 ENCOUNTER — Inpatient Hospital Stay: Payer: Medicare Other | Admitting: Family Medicine

## 2015-04-13 ENCOUNTER — Inpatient Hospital Stay (HOSPITAL_BASED_OUTPATIENT_CLINIC_OR_DEPARTMENT_OTHER): Payer: Medicare Other | Admitting: Internal Medicine

## 2015-04-13 ENCOUNTER — Inpatient Hospital Stay: Payer: Medicare Other | Attending: Internal Medicine

## 2015-04-13 ENCOUNTER — Inpatient Hospital Stay: Payer: Medicare Other

## 2015-04-13 VITALS — BP 108/71 | HR 50 | Temp 98.5°F | Resp 20

## 2015-04-13 VITALS — BP 134/71 | HR 49 | Temp 96.7°F | Resp 18 | Wt 224.4 lb

## 2015-04-13 DIAGNOSIS — Z5111 Encounter for antineoplastic chemotherapy: Secondary | ICD-10-CM | POA: Insufficient documentation

## 2015-04-13 DIAGNOSIS — C9110 Chronic lymphocytic leukemia of B-cell type not having achieved remission: Secondary | ICD-10-CM

## 2015-04-13 DIAGNOSIS — I2510 Atherosclerotic heart disease of native coronary artery without angina pectoris: Secondary | ICD-10-CM

## 2015-04-13 DIAGNOSIS — E780 Pure hypercholesterolemia: Secondary | ICD-10-CM

## 2015-04-13 DIAGNOSIS — Z87891 Personal history of nicotine dependence: Secondary | ICD-10-CM | POA: Diagnosis not present

## 2015-04-13 DIAGNOSIS — I4891 Unspecified atrial fibrillation: Secondary | ICD-10-CM

## 2015-04-13 DIAGNOSIS — M1990 Unspecified osteoarthritis, unspecified site: Secondary | ICD-10-CM | POA: Diagnosis not present

## 2015-04-13 DIAGNOSIS — D6481 Anemia due to antineoplastic chemotherapy: Secondary | ICD-10-CM

## 2015-04-13 DIAGNOSIS — I10 Essential (primary) hypertension: Secondary | ICD-10-CM | POA: Diagnosis not present

## 2015-04-13 DIAGNOSIS — J449 Chronic obstructive pulmonary disease, unspecified: Secondary | ICD-10-CM | POA: Diagnosis not present

## 2015-04-13 DIAGNOSIS — Z79899 Other long term (current) drug therapy: Secondary | ICD-10-CM | POA: Diagnosis not present

## 2015-04-13 DIAGNOSIS — D696 Thrombocytopenia, unspecified: Secondary | ICD-10-CM

## 2015-04-13 DIAGNOSIS — R161 Splenomegaly, not elsewhere classified: Secondary | ICD-10-CM | POA: Diagnosis not present

## 2015-04-13 LAB — CBC WITH DIFFERENTIAL/PLATELET
BASOS ABS: 0.1 10*3/uL (ref 0–0.1)
BASOS PCT: 1 %
EOS ABS: 0.3 10*3/uL (ref 0–0.7)
Eosinophils Relative: 4 %
HEMATOCRIT: 39.9 % — AB (ref 40.0–52.0)
HEMOGLOBIN: 13.8 g/dL (ref 13.0–18.0)
Lymphocytes Relative: 18 %
Lymphs Abs: 1.3 10*3/uL (ref 1.0–3.6)
MCH: 32.6 pg (ref 26.0–34.0)
MCHC: 34.6 g/dL (ref 32.0–36.0)
MCV: 94.3 fL (ref 80.0–100.0)
Monocytes Absolute: 0.9 10*3/uL (ref 0.2–1.0)
Monocytes Relative: 14 %
NEUTROS ABS: 4.4 10*3/uL (ref 1.4–6.5)
NEUTROS PCT: 63 %
Platelets: 145 10*3/uL — ABNORMAL LOW (ref 150–440)
RBC: 4.23 MIL/uL — AB (ref 4.40–5.90)
RDW: 13.9 % (ref 11.5–14.5)
WBC: 6.9 10*3/uL (ref 3.8–10.6)

## 2015-04-13 MED ORDER — DEXAMETHASONE SODIUM PHOSPHATE 10 MG/ML IJ SOLN
10.0000 mg | Freq: Once | INTRAMUSCULAR | Status: DC
  Filled 2015-04-13: qty 1

## 2015-04-13 MED ORDER — DIPHENHYDRAMINE HCL 50 MG/ML IJ SOLN
25.0000 mg | Freq: Once | INTRAMUSCULAR | Status: AC
  Administered 2015-04-13: 25 mg via INTRAVENOUS
  Filled 2015-04-13: qty 1

## 2015-04-13 MED ORDER — ACETAMINOPHEN 325 MG PO TABS
650.0000 mg | ORAL_TABLET | Freq: Once | ORAL | Status: AC
  Administered 2015-04-13: 650 mg via ORAL
  Filled 2015-04-13: qty 2

## 2015-04-13 MED ORDER — SODIUM CHLORIDE 0.9 % IV SOLN
Freq: Once | INTRAVENOUS | Status: AC
  Administered 2015-04-13: 10:00:00 via INTRAVENOUS
  Filled 2015-04-13: qty 1000

## 2015-04-13 MED ORDER — TRAMADOL HCL 50 MG PO TABS: 50.0000 mg | ORAL_TABLET | Freq: Four times a day (QID) | ORAL | Status: DC | PRN

## 2015-04-13 MED ORDER — SODIUM CHLORIDE 0.9 % IV SOLN
375.0000 mg/m2 | Freq: Once | INTRAVENOUS | Status: AC
  Administered 2015-04-13: 800 mg via INTRAVENOUS
  Filled 2015-04-13: qty 70

## 2015-04-13 MED ORDER — SODIUM CHLORIDE 0.9 % IV SOLN
10.0000 mg | Freq: Once | INTRAVENOUS | Status: AC
  Administered 2015-04-13: 10 mg via INTRAVENOUS
  Filled 2015-04-13: qty 1

## 2015-04-15 NOTE — Progress Notes (Signed)
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 Stage 2 given leukocytosis, lymphadenopathy and splenomegaly. Also had peritoneal nodularity which improved on CT scan after chemotherapy. Sept 2015 - Rapidly progressive bulky lymphadenopathy. 05/10/14 - Positive for ZAP-70 and CD38.  Completed chemotherapy with Treanda/Rituxan x 6 cycles (05/10/14 - 09/26/14). Started maintenance Rituxan on 10/24/14.    HISTORY OF PRESENT ILLNESS:  Patient returns for continued oncology followup and plan next dose of maintenance Rituxan therapy for CLL. States that he is doing well, and denies new complaints. 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 No fevers, chills or night sweats. Eating much better. No nausea or vomiting. No mouth sores. Denies feeling recurrent lymph node masses on self-exam. No new dyspnea, cough or hemoptysis. No new bone pains.  REVIEW OF SYSTEMS:   ROS As in HPI above. In addition, no new headaches or focal weakness.  No new sore throat, cough, shortness of breath, sputum, hemoptysis or chest pain. No dizziness or palpitation. No abdominal pain, constipation, diarrhea, dysuria or hematuria. No new skin rash or bleeding symptoms. No new paresthesias in extremities. No polyuria polydipsia. PS ECOG 0.  PAST MEDICAL HISTORY: Past Medical History  Diagnosis Date  . Coronary artery disease     a. 04/2009 CABG x 3 (LIMA->LAD, VG->OM1, VG->PDA);  b. 09/2009 Caht: occluded VG x 2 w/ patent LIMA and L->R collats. EF 55%, mild antlat HK;  c. 10/2011 MV: EF 53%, no isch/infarct-->low risk.  . Pure hypercholesterolemia   . Hypertension   . Atrial fibrillation     a. Dx 2013,  recurred 02/2014, CHA2DS2VASc = 3 -->placed on Eliquis;  b. 02/2014 Echo: EF 50-55%, mid and apical anterior septum and mid and apical inf septum are abnl, mild to mod Ao sclerosis w/o AS.  Marland Kitchen History of arthritis   . Arthritis   . Chicken pox   . Rheumatic fever   . COPD (chronic obstructive pulmonary disease)   . Arthritis   . Chronic lymphocytic leukemia     a. Dx 02/2014.  Marland Kitchen Coronary artery disease     Post CABG 2010, subsequent catheterization 2011 revealed occluded vein grafts X2  . 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 Cardiac catheterization  2011    Dr Kirke Corin  . Cholecystectomy    . Appendectomy  1988  . Tonsillectomy and adenoidectomy  1956  . Coronary artery bypass graft  2010    3v  . Hernia repair      abdominal  . Hernia repair      FAMILY HISTORY: Family History  Problem Relation Age of Onset  . Coronary artery disease      family history  . Heart disease Mother   Remarkable for heart disease. Denies hematological disorders.  SOCIAL HISTORY: Social History  Substance Use Topics  . Smoking status: Former Smoker -- 1.00 packs/day for 40 years    Types: Cigarettes    Quit date: 07/21/2006  . Smokeless tobacco: Never Used  . Alcohol Use: 0.5 oz/week    1 Standard drinks or equivalent per week     Comment: occasionally  Ex-smoker, quit many years ago. Denies alcohol or recreational drug usage. Physically active.  No Known Allergies  Current Outpatient Prescriptions    Medication Sig Dispense Refill  . amiodarone (PACERONE) 200 MG tablet Take 1 tablet (200 mg total) by mouth 2 (two) times daily as needed. 60 tablet 3  . amLODipine (NORVASC) 10 MG tablet Take 0.5 tablets (5 mg total) by mouth daily. 30 tablet 6  . apixaban (ELIQUIS) 5 MG TABS tablet Take 1 tablet (5 mg total) by mouth 2 (two) times daily. 60 tablet 6  . atorvastatin (LIPITOR) 80 MG tablet Take 0.5 tablets (40 mg total) by mouth daily. 90 tablet 6  . budesonide-formoterol (SYMBICORT) 160-4.5 MCG/ACT inhaler Inhale 2 puffs into the lungs 2 (two) times daily as needed. 1 Inhaler 6  . Coenzyme Q10 (CO Q-10) 100 MG CAPS Take 100 mg by mouth daily.    . metoprolol tartrate (LOPRESSOR) 50 MG tablet Take 1 tablet (50 mg total) by mouth 2 (two) times daily. 180 tablet 3  . Multiple Vitamin (MULTI VITAMIN MENS PO) Take 1 tablet by mouth daily.    . nitroGLYCERIN (NITROSTAT) 0.4 MG SL tablet Place 0.4 mg under the tongue every 5 (five) minutes as needed.    . ondansetron (ZOFRAN) 8 MG tablet Take by mouth every 8 (eight) hours as needed for nausea or vomiting.    . tiotropium (SPIRIVA HANDIHALER) 18 MCG inhalation capsule Place 1 capsule (18 mcg total) into inhaler and inhale daily. 30 capsule 6  . traMADol (ULTRAM) 50 MG tablet Take 1 tablet (50 mg total) by mouth every 6 (six) hours as needed. 90 tablet 0  . [DISCONTINUED] isosorbide mononitrate (IMDUR) 30 MG 24 hr tablet Take 1 tablet (30 mg total) by mouth daily. 30 tablet 6   No current facility-administered medications for this visit.    OBJECTIVE: Filed Vitals:   04/13/15 0909  BP: 134/71  Pulse: 49  Temp: 96.7 F (35.9 C)  Resp: 18     Body mass index is 33.13 kg/(m^2).    ECOG FS:0 - Asymptomatic  GENERAL: Alert and oriented and in no acute distress. There is no icterus or pallor. HEENT: EOMs intact. No cervical lymphadenopathy. CVS: S1S2, regular LUNGS: Bilaterally clear to auscultation, no rhonchi. ABDOMEN: Soft, nontender. No  hepatosplenomegaly clinically.  NEURO: grossly nonfocal, cranial nerves are intact.  EXTREMITIES: No pedal edema. LYMPHATICS: No palpable adenopathy in axillary or inguinal areas.   LAB RESULTS:     Component Value Date/Time   NA 139 10/11/2014 1800   NA 141 07/06/2014 0804   K 3.3* 10/11/2014 1800   K 4.5 07/06/2014 0804   CL 106 10/11/2014 1800   CL 108 07/06/2014 0804   CO2 27 10/11/2014 1800   CO2 24 07/06/2014 0804   GLUCOSE 107* 10/11/2014 1800   GLUCOSE 123* 07/06/2014 0804   BUN 15 10/11/2014 1800   BUN 13 07/06/2014 0804   CREATININE 0.86 02/13/2015 0901   CREATININE 0.89 10/11/2014 1800   CALCIUM 8.8* 10/11/2014 1800   CALCIUM 8.9 07/06/2014 0804   PROT 6.4* 10/11/2014 1800   PROT 6.2 07/06/2014 0804   PROT 6.9  09/09/2013 1151   ALBUMIN 4.1 10/11/2014 1800   ALBUMIN 4.0 07/06/2014 0804   AST 23 10/11/2014 1800   AST 15 07/06/2014 0804   ALT 22 10/11/2014 1800   ALT 21 07/06/2014 0804   ALKPHOS 61 10/11/2014 1800   ALKPHOS 64 07/06/2014 0804   BILITOT 0.9 10/11/2014 1800   BILITOT 0.8 07/06/2014 0804   GFRNONAA >60 02/13/2015 0901   GFRNONAA >60 10/11/2014 1800   GFRNONAA >60 08/15/2014 1205   GFRAA >60 02/13/2015 0901   GFRAA >60 10/11/2014 1800   GFRAA >60 08/15/2014 1205   Lab Results  Component Value Date   WBC 6.9 04/13/2015   NEUTROABS 4.4 04/13/2015   HGB 13.8 04/13/2015   HCT 39.9* 04/13/2015   MCV 94.3 04/13/2015   PLT 145* 04/13/2015     STUDIES: 03/07/14 - Peripheral blood Flow Cytometry: Interpretation: The phenotype is most consistent with a diagnosis of CLL/SLL), CD38 indeterminate. A monoclonal B cell population is detected with kappa light chain restriction. The monoclonal B cells variably express CD19, CD20, CD11c, CD23, CD38(27%),  and aberrant CD5. There is no loss of, or aberrant expression of, the pan T cell antigens to suggest a neoplastic T cell process. CD4:CD8 ratio 1.1 Gamma-delta (GD) T-cells are relatively increased,  accounting for approximately 10% of T cells. Typically GD T-cells account for 1-3% of the T cell population. Increased GD T-cells may be seen in association with infectious processes as well as certain neoplastic processes. No circulating blasts are detected. There is no immunophenotypic  evidence of abnormal myeloid maturation. Viability 96%. Abnormal cell population: present-83% of total cells. Phenotype Chart:  CD2       (-)            CD3       (-) CD4       (-)            CD5       (+) CD7       (-)            CD8       (-) CD10      (-)            CD11b     (-) CD11c     (+)            CD13      (-) CD14      (-)            CD15      (-) CD16      (-)            CD19      (+) CD20      (+) Dim        CD22      (-) CD23      (+) Bright     CD33      (-) CD34      (-)            CD38      See Text CD45      (+)            CD56      (-) CD57      (-)            CD103     (-) CD117     (-)            alpha-betaIM** gamma-deltIM**  FMC-7     (-) HLA-DR    (+)            KAPPA     (+) LAMBDA    (-)            CD64      (-).  04/18/14 - CT scan of chest/abdomen/pelvis. IMPRESSION: 1. Adenopathy is identified within the chest abdomen and pelvis compatible with lymphoma.  2. Splenomegaly.  3. There are several soft tissue attenuating nodules along the undersurface of the ventral abdominal wall which may indicate peritoneal involvement by tumor.  4. Atherosclerotic disease. Ectatic abdominal aorta at risk for aneurysm development. Recommend follow up by [place] in 5 years. This recommendation follows ACR consensus guidelines.  07/18/14 - CT scan of chest/abdomen/pelvis. IMPRESSION: 1. Interval near-complete resolution of previously demonstrated extensive lymphadenopathy. There are few residual borderline lymph nodes within the axilla and upper abdomen. 2. Interval resolution of splenomegaly and peritoneal nodularity. 3. Findings are consistent with significant response to interval therapy for  CLL.   ASSESSMENT / PLAN:   1. 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 Stage 2 given leukocytosis, lymphadenopathy, and splenomegaly. Positive for ZAP-70 and CD38. Core biopsy of axillary lymph node is also consistent with CLL/SLL. 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 Stage 2 given leukocytosis, lymphadenopathy and splenomegaly. Sept 2015 - Rapidly progressive bulky lymphadenopathy. 05/10/14 - Positive for ZAP-70 and CD38. Completed chemotherapy with Treanda/Rituxan x 6 cycles (05/10/14 - 09/26/14). Started maintenance Rituxan on 10/24/14. Last CT scan of the chest, abdomen, and pelvis done on December 29 showed excellent response of extensive lymphadenopathy to chemotherapy, there were a few residual borderline lymph nodes in the axilla and upper abdomen. Splenomegaly and peritoneal nodularity had also resolved, indicating that these were also CLL  -    Have reviewed labs from today and d/w patient and family present. Overall doing fairly steady without any new side effects from maintenance Rituxan. Plan is to continue on current treatment regimen, will proceed with next dose of maintenance Rituxan 375 mg/m2 today. Will get CBC at 8 weeks from now and give next dose of Rituxan 375 mg/m IV. Next MD follow up at 16 weeks with repeat labs and plan continued treatment. Have explained that maintenance Rituxan is planned for total duration of 2 years.  2. Anemia likely secondary to CLL and recent chemotherapy - has improved. Continue to monitor.  3. Thrombocytopenia  likely secondary to CLL and recent chemotherapy - mild, no bleeding issues. Continue to monitor.   4. Nutrition - Encouraged him to continue to maintain adequate protein calorie intake. Appetite is good.  5. In between visits, patient advised to call or come to ER in  case of fevers, bleeding, acute sickness or new symptoms. He is agreeable to this plan.      Leia Alf, MD   04/15/2015 4:13 AM

## 2015-06-08 ENCOUNTER — Inpatient Hospital Stay: Payer: Medicare Other | Attending: Internal Medicine

## 2015-06-08 ENCOUNTER — Other Ambulatory Visit: Payer: Self-pay | Admitting: Internal Medicine

## 2015-06-08 ENCOUNTER — Inpatient Hospital Stay: Payer: Medicare Other

## 2015-06-08 VITALS — BP 124/71 | HR 50 | Temp 96.7°F | Resp 20

## 2015-06-08 DIAGNOSIS — C9110 Chronic lymphocytic leukemia of B-cell type not having achieved remission: Secondary | ICD-10-CM | POA: Insufficient documentation

## 2015-06-08 DIAGNOSIS — Z5112 Encounter for antineoplastic immunotherapy: Secondary | ICD-10-CM | POA: Insufficient documentation

## 2015-06-08 LAB — CREATININE, SERUM
CREATININE: 0.89 mg/dL (ref 0.61–1.24)
GFR calc Af Amer: >60 mL/min (ref >60)

## 2015-06-08 LAB — CBC WITH DIFFERENTIAL/PLATELET
BASOS ABS: 0.1 10*3/uL (ref 0–0.1)
Basophils Relative: 1 %
Eosinophils Absolute: 0.2 10*3/uL (ref 0–0.7)
Eosinophils Relative: 4 %
HEMATOCRIT: 39.8 % — AB (ref 40.0–52.0)
Hemoglobin: 13.6 g/dL (ref 13.0–18.0)
LYMPHS ABS: 1 10*3/uL (ref 1.0–3.6)
LYMPHS PCT: 20 %
MCH: 32.1 pg (ref 26.0–34.0)
MCHC: 34.3 g/dL (ref 32.0–36.0)
MCV: 93.6 fL (ref 80.0–100.0)
MONO ABS: 0.7 10*3/uL (ref 0.2–1.0)
MONOS PCT: 13 %
NEUTROS ABS: 3.2 10*3/uL (ref 1.4–6.5)
Neutrophils Relative %: 62 %
Platelets: 142 10*3/uL — ABNORMAL LOW (ref 150–440)
RBC: 4.25 MIL/uL — ABNORMAL LOW (ref 4.40–5.90)
RDW: 13.6 % (ref 11.5–14.5)
WBC: 5.2 10*3/uL (ref 3.8–10.6)

## 2015-06-08 MED ORDER — SODIUM CHLORIDE 0.9 % IV SOLN: 375.0000 mg/m2 | Freq: Once | INTRAVENOUS | Status: DC

## 2015-06-08 MED ORDER — ACETAMINOPHEN 325 MG PO TABS
650.0000 mg | ORAL_TABLET | Freq: Once | ORAL | Status: AC
  Administered 2015-06-08: 650 mg via ORAL
  Filled 2015-06-08: qty 2

## 2015-06-08 MED ORDER — DIPHENHYDRAMINE HCL 25 MG PO CAPS
50.0000 mg | ORAL_CAPSULE | Freq: Once | ORAL | Status: AC
  Administered 2015-06-08: 50 mg via ORAL
  Filled 2015-06-08: qty 2

## 2015-06-08 MED ORDER — SODIUM CHLORIDE 0.9 % IV SOLN
800.0000 mg | Freq: Once | INTRAVENOUS | Status: AC
  Administered 2015-06-08: 800 mg via INTRAVENOUS
  Filled 2015-06-08: qty 70

## 2015-06-08 MED ORDER — SODIUM CHLORIDE 0.9 % IV SOLN
Freq: Once | INTRAVENOUS | Status: AC
  Administered 2015-06-08: 12:00:00 via INTRAVENOUS
  Filled 2015-06-08: qty 1000

## 2015-06-20 ENCOUNTER — Ambulatory Visit (INDEPENDENT_AMBULATORY_CARE_PROVIDER_SITE_OTHER): Payer: Medicare Other | Admitting: Cardiovascular Disease

## 2015-06-20 ENCOUNTER — Encounter: Payer: Self-pay | Admitting: Cardiovascular Disease

## 2015-06-20 VITALS — BP 132/76 | HR 56 | Ht 69.0 in | Wt 226.2 lb

## 2015-06-20 DIAGNOSIS — E7800 Pure hypercholesterolemia, unspecified: Secondary | ICD-10-CM

## 2015-06-20 DIAGNOSIS — R0602 Shortness of breath: Secondary | ICD-10-CM

## 2015-06-20 DIAGNOSIS — I10 Essential (primary) hypertension: Secondary | ICD-10-CM

## 2015-06-20 DIAGNOSIS — I25810 Atherosclerosis of coronary artery bypass graft(s) without angina pectoris: Secondary | ICD-10-CM

## 2015-06-20 DIAGNOSIS — I4891 Unspecified atrial fibrillation: Secondary | ICD-10-CM

## 2015-06-20 DIAGNOSIS — Z23 Encounter for immunization: Secondary | ICD-10-CM

## 2015-06-20 DIAGNOSIS — J432 Centrilobular emphysema: Secondary | ICD-10-CM

## 2015-06-20 MED ORDER — EZETIMIBE 10 MG PO TABS: 10.0000 mg | ORAL_TABLET | Freq: Every day | ORAL | Status: DC

## 2015-06-20 NOTE — Patient Instructions (Signed)
You are doing well.  Please start zetia one pill a day  Flu shot today  Please call us if you have new issues that need to be addressed before your next appt.  Your physician wants you to follow-up in: 6 months.  You will receive a reminder letter in the mail two months in advance. If you don't receive a letter, please call our office to schedule the follow-up appointment.

## 2015-06-20 NOTE — Assessment & Plan Note (Signed)
Chronic shortness of breath on heavy exertion. No changes on today's visit. Likely secondary to underlying COPD

## 2015-06-20 NOTE — Assessment & Plan Note (Signed)
He is requesting follow-up with pulmonary. We will help facilitate this appointment Continued chronic but stable shortness of breath on exertion

## 2015-06-20 NOTE — Assessment & Plan Note (Signed)
We'll try to get his LDL down, ideally less than 70 We will start zetia 10 mg daily in addition to his statin

## 2015-06-20 NOTE — Progress Notes (Signed)
Patient ID: [name], male    DOB: Mar 15, 1946, 69 y.o.   MRN: [id number]  HPI Comments: [name] is a 69 year old gentleman with a history of smoking, coronary artery disease, cabg, Last catheterization March 2011 with occluded vein graft 2, patent LIMA, collaterals from left to right, atrial fibrillation March 2013, who presents for follow-up of his coronary artery disease and atrial fibrillation. 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 Converted back to normal with metoprolol. Possible conversion pause.  In follow-up today, he reports that he is doing well. Denies any significant chest pain concerning for angina Reports one episode of tachycardia concerning for atrial fibrillation 05/10/2015. Lasted only several minutes Rate possibly up to 140 bpm. Did not have time to get amiodarone before rhythm went back to normal Otherwise denies any other episodes  Previously has been very active, building a barn, tool shed, taking care of animals  EKG on today's visit shows normal sinus rhythm with heart rate 56 bpm, no significant ST or T-wave changes  Other past medical history  some benefits from the [place] for agent orange exposure and his cancer   total cholesterol 137, LDL 86, earlier in the year  chronic leg pain since he was young.  Previous leg pain with Crestor   admitted to the hospital on September 26 2011 for malaise, appearing pale, irregular heart rhythm and noted to be in atrial fibrillation. He was given medication for rhythm control and he converted to normal sinus rhythm later that evening. No longer on anticoagulation. He has been maintaining normal sinus rhythm No symptoms concerning for atrial fibrillation since that time  Echocardiogram showed ejection fraction 35-45%, mildly elevated right ventricular systolic pressures estimated at 30-40 mm mercury.  Previous catheterization showing occluded LAD in the mid vessel,  60% left main, 99% distal RCA who was sent to [facility] in October 2010 for bypass surgery. He received 3 vessel bypass by Dr.Gerhardt with a LIMA to the LAD, vein graft to the OM1 and vein graft to the PDA, with subsequent cardiac catheter March 2011 for chest discomfort showing occluded vein grafts x2 with patent LIMA. He has collateral vessels from left to right. Ejection fraction 55% mild anterolateral hypokinesis.  No Known Allergies  Current Outpatient Prescriptions on File Prior to Visit  Medication Sig Dispense Refill  . amiodarone (PACERONE) 200 MG tablet Take 1 tablet (200 mg total) by mouth 2 (two) times daily as needed. 60 tablet 3  . amLODipine (NORVASC) 10 MG tablet Take 0.5 tablets (5 mg total) by mouth daily. 30 tablet 6  . apixaban (ELIQUIS) 5 MG TABS tablet Take 1 tablet (5 mg total) by mouth 2 (two) times daily. 60 tablet 6  . atorvastatin (LIPITOR) 80 MG tablet Take 0.5 tablets (40 mg total) by mouth daily. 90 tablet 6  . budesonide-formoterol (SYMBICORT) 160-4.5 MCG/ACT inhaler Inhale 2 puffs into the lungs 2 (two) times daily as needed. 1 Inhaler 6  . Coenzyme Q10 (CO Q-10) 100 MG CAPS Take 100 mg by mouth daily.    . metoprolol tartrate (LOPRESSOR) 50 MG tablet Take 1 tablet (50 mg total) by mouth 2 (two) times daily. 180 tablet 3  . Multiple Vitamin (MULTI VITAMIN MENS PO) Take 1 tablet by mouth daily.    . nitroGLYCERIN (NITROSTAT) 0.4 MG SL tablet Place 0.4 mg under the tongue every 5 (five) minutes as needed.    . ondansetron (ZOFRAN) 8  MG tablet Take by mouth every 8 (eight) hours as needed for nausea or vomiting.    . tiotropium (SPIRIVA HANDIHALER) 18 MCG inhalation capsule Place 1 capsule (18 mcg total) into inhaler and inhale daily. 30 capsule 6  . traMADol (ULTRAM) 50 MG tablet Take 1 tablet (50 mg total) by mouth every 6 (six) hours as needed. 90 tablet 0  . [DISCONTINUED] isosorbide mononitrate (IMDUR) 30 MG 24 hr tablet Take 1 tablet (30 mg total) by mouth  daily. 30 tablet 6   No current facility-administered medications on file prior to visit.    Past Medical History  Diagnosis Date  . Coronary artery disease     a. 04/2009 CABG x 3 (LIMA->LAD, VG->OM1, VG->PDA);  b. 09/2009 Caht: occluded VG x 2 w/ patent LIMA and L->R collats. EF 55%, mild antlat HK;  c. 10/2011 MV: EF 53%, no isch/infarct-->low risk.  . Pure hypercholesterolemia   . Hypertension   . Atrial fibrillation ([place])     a. Dx 2013, recurred 02/2014, CHA2DS2VASc = 3 -->placed on Eliquis;  b. 02/2014 Echo: EF 50-55%, mid and apical anterior septum and mid and apical inf septum are abnl, mild to mod Ao sclerosis w/o AS.  Marland Kitchen History of arthritis   . Arthritis   . Chicken pox   . Rheumatic fever   . COPD (chronic obstructive pulmonary disease) ([place])   . Arthritis   . Chronic lymphocytic leukemia ([place])     a. Dx 02/2014.  Marland Kitchen Coronary artery disease     Post CABG 2010, subsequent catheterization 2011 revealed occluded vein grafts X2  . Hyperlipidemia     Past Surgical History  Procedure Laterality Date  . Cardiac catheterization  2011    Dr Fletcher Anon  . Cholecystectomy    . Appendectomy  1988  . Tonsillectomy and adenoidectomy  1956  . Coronary artery bypass graft  2010    3v  . Hernia repair      abdominal  . Hernia repair      Social History  reports that he quit smoking about 8 years ago. His smoking use included Cigarettes. He has a 40 pack-year smoking history. He has never used smokeless tobacco. He reports that he drinks about 0.5 oz of alcohol per week. He reports that he does not use illicit drugs.  Family History family history includes Coronary artery disease in an other family member; Heart disease in his mother.   Review of Systems  Constitutional: Negative.   Eyes: Negative.   Respiratory: Negative.   Cardiovascular: Positive for palpitations.  Gastrointestinal: Negative.   Endocrine: Negative.   Musculoskeletal: Positive for arthralgias and gait problem.   Neurological: Negative.   Psychiatric/Behavioral: Negative.   All other systems reviewed and are negative.  BP 132/76 mmHg  Pulse 56  Ht 5\' 9"  (1.753 m)  Wt 226 lb 4 oz (102.626 kg)  BMI 33.40 kg/m2  Physical Exam  Constitutional: He is oriented to person, place, and time. He appears well-developed and well-nourished.  HENT:  Head: Normocephalic.  Nose: Nose normal.  Mouth/Throat: Oropharynx is clear and moist.  Eyes: Conjunctivae are normal. Pupils are equal, round, and reactive to light.  Neck: Normal range of motion. Neck supple. No JVD present.  Cardiovascular: Normal rate, regular rhythm, S1 normal, S2 normal, normal heart sounds and intact distal pulses.  Exam reveals no gallop and no friction rub.   No murmur heard. Pulmonary/Chest: Effort normal and breath sounds normal. No respiratory distress. He has no wheezes.  He has no rales. He exhibits no tenderness.  Abdominal: Soft. Bowel sounds are normal. He exhibits no distension. There is no tenderness.  Musculoskeletal: Normal range of motion. He exhibits no edema or tenderness.  Lymphadenopathy:    He has no cervical adenopathy.  Neurological: He is alert and oriented to person, place, and time. Coordination normal.  Skin: Skin is warm and dry. No rash noted. No erythema.  Psychiatric: He has a normal mood and affect. His behavior is normal. Judgment and thought content normal.      Assessment and Plan   Nursing note and vitals reviewed.

## 2015-06-20 NOTE — Assessment & Plan Note (Signed)
Blood pressure is well controlled on today's visit. No changes made to the medications. 

## 2015-06-20 NOTE — Assessment & Plan Note (Signed)
Rare episodes of atrial fibrillation We'll continue current regiment

## 2015-06-20 NOTE — Assessment & Plan Note (Signed)
Currently with no symptoms of angina. No further workup at this time. Continue current medication regimen. 

## 2015-06-22 ENCOUNTER — Telehealth: Payer: Self-pay | Admitting: *Deleted

## 2015-06-22 NOTE — Telephone Encounter (Signed)
Pt wife calling stating his HR is 144 at the moment. Thinks he is in Afib.  Not sure what exactly to take.  Please advise  Just saw doctor this week.

## 2015-06-22 NOTE — Telephone Encounter (Signed)
Spoke w/ pt and his wife.  Pt has instructions from June ov:  "If you have atrial fibrillation, Please take extra metoprolol (25 mg x 1) Also take amiodarone x 2 pill"  He has not taken any meds yet, just wanted to let Dr. Rockey Situ know.  Advised pt to go ahead and take metoprolol & amiodarone as instructed and call the after hours # if sx do not resolve.

## 2015-06-28 ENCOUNTER — Ambulatory Visit (INDEPENDENT_AMBULATORY_CARE_PROVIDER_SITE_OTHER): Payer: Medicare Other | Admitting: Pulmonary Disease

## 2015-06-28 ENCOUNTER — Encounter: Payer: Self-pay | Admitting: Pulmonary Disease

## 2015-06-28 VITALS — BP 136/64 | HR 59 | Ht 69.0 in | Wt 225.4 lb

## 2015-06-28 DIAGNOSIS — J449 Chronic obstructive pulmonary disease, unspecified: Secondary | ICD-10-CM

## 2015-06-28 DIAGNOSIS — G4733 Obstructive sleep apnea (adult) (pediatric): Secondary | ICD-10-CM

## 2015-06-28 NOTE — Patient Instructions (Signed)
Clarification of inhaler medications:  Spiriva daily @ bedtime (no change)  Symbicort - 2 puffs twice a day, morning and bedtime, every day  Proventil (albuterol) 1-2 puffs as needed  We have scheduled a sleep study  We have scheduled lung function tests  Follow up in 4 weeks or so with a CXR. We will review the test results together then

## 2015-07-01 NOTE — Progress Notes (Signed)
PULMONARY CONSULT NOTE  Requesting MD/Service: Rockey Situ, Cardfiology  Date of initial consultation: 06/28/15 Reason for consultation: Dyspnea, history of COPD (previously managed by Dr Lake Bells)  HPI:  [name] with history of COPD, previosuly managed by Dr Lake Bells (last seen approx 2 yrs ago). Referred by Dr Rockey Situ for re-evaluation of chronic exertional dyspnea. Pt reports progressive dyspnea over the past year. He has occasional chest tightness. He has chronic cough with occasional morning mucus described as "phlegm". Denies F/C/S, hemoptysis, pleuritic CP, orthopnea, PND, LE edema and calf tenderness. He has been prescribed Symbicort which he is using as a rescue inhaler and albuterol which he doesn't use at all. He is also noted to be on amiodarone.   In addition, he reports that his wife has noted frequent nocturnal apneas. His sleep quality is poor whic he attributes to arthritis pain. He has moderate daytime sleepiness but has never fallen asleep at critical times such as while driving.  Past Medical History  Diagnosis Date  . Coronary artery disease     a. 04/2009 CABG x 3 (LIMA->LAD, VG->OM1, VG->PDA);  b. 09/2009 Caht: occluded VG x 2 w/ patent LIMA and L->R collats. EF 55%, mild antlat HK;  c. 10/2011 MV: EF 53%, no isch/infarct-->low risk.  . Pure hypercholesterolemia   . Hypertension   . Atrial fibrillation ([place])     a. Dx 2013, recurred 02/2014, CHA2DS2VASc = 3 -->placed on Eliquis;  b. 02/2014 Echo: EF 50-55%, mid and apical anterior septum and mid and apical inf septum are abnl, mild to mod Ao sclerosis w/o AS.  Marland Kitchen History of arthritis   . Arthritis   . Chicken pox   . Rheumatic fever   . COPD (chronic obstructive pulmonary disease) ([place])   . Arthritis   . Chronic lymphocytic leukemia ([place])     a. Dx 02/2014.  Marland Kitchen Coronary artery disease     Post CABG 2010, subsequent catheterization 2011 revealed occluded vein grafts X2  . Hyperlipidemia     Past Surgical History  Procedure  Laterality Date  . Cardiac catheterization  2011    Dr Fletcher Anon  . Cholecystectomy    . Appendectomy  1988  . Tonsillectomy and adenoidectomy  1956  . Coronary artery bypass graft  2010    3v  . Hernia repair      abdominal  . Hernia repair      MEDICATIONS: I have reviewed all medications and confirmed regimen as documented  Social History   Social History  . Marital Status: Married    Spouse Name: N/A  . Number of Children: N/A  . Years of Education: N/A   Occupational History  . retired     Research officer, trade union   Social History Main Topics  . Smoking status: Former Smoker -- 1.00 packs/day for 40 years    Types: Cigarettes    Quit date: 07/21/2006  . Smokeless tobacco: Never Used  . Alcohol Use: 0.5 oz/week    1 Standard drinks or equivalent per week     Comment: occasionally  . Drug Use: No  . Sexual Activity: Not on file   Other Topics Concern  . Not on file   Social History Narrative   Lives in [place] with wife. Dogs. Goats. Children - 4. Grandchildren - 7.      Worked - Sports coach, part time.   Diet - healthy   Exercise - very active      Service - [employer] - [place]    Family  History  Problem Relation Age of Onset  . Coronary artery disease      family history  . Heart disease Mother     ROS: No fever, myalgias, unexplained weight loss or weight gain No new focal weakness or sensory deficits No otalgia, hearing loss, visual changes, nasal and sinus symptoms, mouth and throat problems No neck pain or adenopathy No abdominal pain, N/V/D, diarrhea, change in bowel pattern No dysuria, change in urinary pattern No LE edema or calf tenderness   Filed Vitals:   06/28/15 1137  BP: 136/64  Pulse: 59  Height: 5\' 9"  (1.753 Doyle)  Weight: 225 lb 6.4 oz (102.241 kg)  SpO2: 97%     EXAM:  Gen: WDWN, No overt respiratory distress HEENT: NCAT, TMs and canals normal, sclera white, nares and nasal mucosa normal, oropharynx normal Neck:  Supple without LAN, thyromegaly, JVD Lungs: breath sounds markedly decreased - esp in bases, percussion note normal throughout, few scattered wheezes Cardiovascular: Reg rhythm, rate normal, soft systolic murmur noted Abdomen: Soft, nontender, normal BS Ext: without clubbing, cyanosis, edema Neuro: CNs grossly intact, motor and sensory intact, DTRs symmetric Skin: Limited exam, no lesions noted  DATA:   BMP Latest Ref Rng 06/08/2015 02/13/2015 12/19/2014  Glucose - - - -  BUN - - - -  Creatinine 0.Michael - 1.24 mg/dL 0.89 0.86 0.69  Sodium - - - -  Potassium - - - -  Chloride - - - -  CO2 - - - -  Calcium - - - -    CBC Latest Ref Rng 06/08/2015 04/13/2015 02/13/2015  WBC 3.8 - 10.6 K/uL 5.2 6.9 6.0  Hemoglobin 13.0 - 18.0 g/dL 13.6 13.8 13.2  Hematocrit 40.0 - 52.0 % 39.8(L) 39.9(L) 38.8(L)  Platelets 150 - 440 K/uL 142(L) 145(L) 128(L)    CXR (03/07/14): L basilar atelectasis    IMPRESSION:     ICD-9-CM ICD-10-CM   1. Chronic obstructive pulmonary disease, unspecified COPD type ([place]) 496 J44.9 Pulmonary function test  2. OSA (obstructive sleep apnea) 327.23 G47.33 Polysomnography 4 or more parameters (NPSG)     PLAN:  I have counseled re: proper use of his inhaler medications. Symbicort is to be 2 inhalations BID every day and albuterol MDI is to be used as a rescue inhaler A polysomnogram has been ordered to investigate likely OSA ROV 4 weeks with PFTs and CXR   Wilhelmina Mcardle, MD [facility], Critical Care Medicine

## 2015-07-05 ENCOUNTER — Ambulatory Visit (INDEPENDENT_AMBULATORY_CARE_PROVIDER_SITE_OTHER): Payer: Medicare Other | Admitting: Internal Medicine

## 2015-07-05 ENCOUNTER — Encounter: Payer: Self-pay | Admitting: Internal Medicine

## 2015-07-05 VITALS — BP 105/64 | HR 50 | Temp 98.1°F | Ht 69.0 in | Wt 227.2 lb

## 2015-07-05 DIAGNOSIS — M170 Bilateral primary osteoarthritis of knee: Secondary | ICD-10-CM

## 2015-07-05 DIAGNOSIS — I10 Essential (primary) hypertension: Secondary | ICD-10-CM

## 2015-07-05 DIAGNOSIS — Z23 Encounter for immunization: Secondary | ICD-10-CM | POA: Diagnosis not present

## 2015-07-05 DIAGNOSIS — I4891 Unspecified atrial fibrillation: Secondary | ICD-10-CM

## 2015-07-05 DIAGNOSIS — E7800 Pure hypercholesterolemia, unspecified: Secondary | ICD-10-CM | POA: Diagnosis not present

## 2015-07-05 DIAGNOSIS — J432 Centrilobular emphysema: Secondary | ICD-10-CM

## 2015-07-05 LAB — COMPREHENSIVE METABOLIC PANEL
ALK PHOS: 85 U/L (ref 39–117)
ALT: 19 U/L (ref 0–53)
AST: 15 U/L (ref 0–37)
Albumin: 4.3 g/dL (ref 3.5–5.2)
BILIRUBIN TOTAL: 1.1 mg/dL (ref 0.2–1.2)
BUN: 10 mg/dL (ref 6–23)
CALCIUM: 9.1 mg/dL (ref 8.4–10.5)
CO2: 31 meq/L (ref 19–32)
CREATININE: 0.88 mg/dL (ref 0.40–1.50)
Chloride: 105 mEq/L (ref 96–112)
GFR: 90.99 mL/min (ref >60.00)
GLUCOSE: 116 mg/dL — AB (ref 70–99)
Potassium: 4.8 mEq/L (ref 3.5–5.1)
Sodium: 141 mEq/L (ref 135–145)
TOTAL PROTEIN: 6.3 g/dL (ref 6.0–8.3)

## 2015-07-05 LAB — LIPID PANEL
CHOL/HDL RATIO: 3
CHOLESTEROL: 139 mg/dL (ref 0–200)
HDL: 40.1 mg/dL (ref >39.00)
LDL Cholesterol: 76 mg/dL (ref 0–99)
NonHDL: 98.86
TRIGLYCERIDES: 115 mg/dL (ref 0.0–149.0)
VLDL: 23 mg/dL (ref 0.0–40.0)

## 2015-07-05 NOTE — Assessment & Plan Note (Signed)
BP Readings from Last 3 Encounters:  07/05/15 105/64  06/28/15 136/64  06/20/15 132/76   BP well controlled. Renal function with labs. Continue current medication.

## 2015-07-05 NOTE — Patient Instructions (Addendum)
Labs today.  Please let us know if you would like to see an orthopedic specialist. I would recommend Dr. Maureen Ralphs or Dr. Marry Guan.  Follow up 6 months to 1 year.

## 2015-07-05 NOTE — Progress Notes (Signed)
Subjective:    Patient ID: [name], male    DOB: November 10, 1945, 69 y.o.   MRN: [id number]  HPI  69YO male presents for followup.  Bilateral knee pain - Aching pain bilateral knees. Worse with activity and weight bearing. No swelling. Taking Tramadol for pain with some improvement. Notes some grinding in knees.  CLL - Continues to follow up with oncology.  HL - Started on Zetia by Dr. Rockey Situ. Continues on Atorvastatin.  COPD - Seen by Dr. Leonidas Romberg. Scheduled for sleep study, CXR and PFTs. Continues to have some dyspnea with exertion. Wife notes snoring and apnea at night.  Afib - no recent palpitations. Compliant with medications.   Wt Readings from Last 3 Encounters:  07/05/15 227 lb 4 oz (103.08 kg)  06/28/15 225 lb 6.4 oz (102.241 kg)  06/20/15 226 lb 4 oz (102.626 kg)   BP Readings from Last 3 Encounters:  07/05/15 105/64  06/28/15 136/64  06/20/15 132/76    Past Medical History  Diagnosis Date  . Coronary artery disease     a. 04/2009 CABG x 3 (LIMA->LAD, VG->OM1, VG->PDA);  b. 09/2009 Caht: occluded VG x 2 w/ patent LIMA and L->R collats. EF 55%, mild antlat HK;  c. 10/2011 MV: EF 53%, no isch/infarct-->low risk.  . Pure hypercholesterolemia   . Hypertension   . Atrial fibrillation ([place])     a. Dx 2013, recurred 02/2014, CHA2DS2VASc = 3 -->placed on Eliquis;  b. 02/2014 Echo: EF 50-55%, mid and apical anterior septum and mid and apical inf septum are abnl, mild to mod Ao sclerosis w/o AS.  Marland Kitchen History of arthritis   . Arthritis   . Chicken pox   . Rheumatic fever   . COPD (chronic obstructive pulmonary disease) ([place])   . Arthritis   . Chronic lymphocytic leukemia ([place])     a. Dx 02/2014.  Marland Kitchen Coronary artery disease     Post CABG 2010, subsequent catheterization 2011 revealed occluded vein grafts X2  . Hyperlipidemia    Family History  Problem Relation Age of Onset  . Coronary artery disease      family history  . Heart disease Mother    Past Surgical History   Procedure Laterality Date  . Cardiac catheterization  2011    Dr Fletcher Anon  . Cholecystectomy    . Appendectomy  1988  . Tonsillectomy and adenoidectomy  1956  . Coronary artery bypass graft  2010    3v  . Hernia repair      abdominal  . Hernia repair     Social History   Social History  . Marital Status: Married    Spouse Name: N/A  . Number of Children: N/A  . Years of Education: N/A   Occupational History  . retired     Research officer, trade union   Social History Main Topics  . Smoking status: Former Smoker -- 1.00 packs/day for 40 years    Types: Cigarettes    Quit date: 07/21/2006  . Smokeless tobacco: Never Used  . Alcohol Use: 0.5 oz/week    1 Standard drinks or equivalent per week     Comment: occasionally  . Drug Use: No  . Sexual Activity: Not Asked   Other Topics Concern  . None   Social History Narrative   Lives in [place] with wife. Dogs. Goats. Children - 4. Grandchildren - 7.      Worked - Sports coach, part time.   Diet - healthy   Exercise -  very active      Service - [employer] - [place]    Review of Systems  Constitutional: Negative for fever, chills, activity change, appetite change, fatigue and unexpected weight change.  Eyes: Negative for visual disturbance.  Respiratory: Positive for apnea (noted by wife when sleeping) and shortness of breath (on exertion). Negative for cough and wheezing.   Cardiovascular: Negative for chest pain, palpitations and leg swelling.  Gastrointestinal: Negative for nausea, vomiting, abdominal pain, diarrhea, constipation and abdominal distention.  Genitourinary: Negative for dysuria, urgency and difficulty urinating.  Musculoskeletal: Negative for arthralgias and gait problem.  Skin: Negative for color change and rash.  Hematological: Negative for adenopathy.  Psychiatric/Behavioral: Negative for sleep disturbance and dysphoric mood. The patient is not nervous/anxious.        Objective:    BP 105/64 mmHg   Pulse 50  Temp(Src) 98.1 F (36.7 C) (Oral)  Ht 5\' 9"  (1.753 m)  Wt 227 lb 4 oz (103.08 kg)  BMI 33.54 kg/m2  SpO2 96% Physical Exam  Constitutional: He is oriented to person, place, and time. He appears well-developed and well-nourished. No distress.  HENT:  Head: Normocephalic and atraumatic.  Right Ear: External ear normal.  Left Ear: External ear normal.  Nose: Nose normal.  Mouth/Throat: Oropharynx is clear and moist. No oropharyngeal exudate.  Eyes: Conjunctivae and EOM are normal. Pupils are equal, round, and reactive to light. Right eye exhibits no discharge. Left eye exhibits no discharge. No scleral icterus.  Neck: Normal range of motion. Neck supple. No tracheal deviation present. No thyromegaly present.  Cardiovascular: Normal rate, regular rhythm and normal heart sounds.  Exam reveals no gallop and no friction rub.   No murmur heard. Pulmonary/Chest: Effort normal and breath sounds normal. No accessory muscle usage. No tachypnea. No respiratory distress. He has no decreased breath sounds. He has no wheezes. He has no rhonchi. He has no rales. He exhibits no tenderness.  Musculoskeletal: Normal range of motion. He exhibits no edema.  Lymphadenopathy:    He has no cervical adenopathy.  Neurological: He is alert and oriented to person, place, and time. No cranial nerve deficit. Coordination normal.  Skin: Skin is warm and dry. No rash noted. He is not diaphoretic. No erythema. No pallor.  Psychiatric: He has a normal mood and affect. His behavior is normal. Judgment and thought content normal.          Assessment & Plan:   Problem List Items Addressed This Visit      Unprioritized   Atrial fibrillation    NSR on exam today. Continue Amiodarone and Eliquis. Continue follow up with cardiology today.      COPD (chronic obstructive pulmonary disease) ([place]) - Primary   HYPERCHOLESTEROLEMIA    Recently started on Zetia. Will check lipids today. Continue Zetia and  Atorvastatin.      Relevant Orders   Lipid panel   Comprehensive metabolic panel   HYPERTENSION, BENIGN    BP Readings from Last 3 Encounters:  07/05/15 105/64  06/28/15 136/64  06/20/15 132/76   BP well controlled. Renal function with labs. Continue current medication.      Osteoarthritis of both knees    Recommended ortho evaluation. Continue prn Tramadol for pain. He will look into referral through the [place] given costs.          Return in about 1 year (around 07/04/2016) for [place].

## 2015-07-05 NOTE — Assessment & Plan Note (Signed)
Recommended ortho evaluation. Continue prn Tramadol for pain. He will look into referral through the [place] given costs.

## 2015-07-05 NOTE — Assessment & Plan Note (Signed)
NSR on exam today. Continue Amiodarone and Eliquis. Continue follow up with cardiology today.

## 2015-07-05 NOTE — Assessment & Plan Note (Signed)
Recently started on Zetia. Will check lipids today. Continue Zetia and Atorvastatin.

## 2015-07-05 NOTE — Progress Notes (Signed)
Pre visit review using our clinic review tool, if applicable. No additional management support is needed unless otherwise documented below in the visit note. 

## 2015-07-05 NOTE — Addendum Note (Signed)
Addended by: Vernetta Honey on: 07/05/2015 02:33 PM   Modules accepted: Orders

## 2015-07-10 ENCOUNTER — Telehealth: Payer: Self-pay

## 2015-07-10 NOTE — Telephone Encounter (Signed)
Pt states he has not heard anything from [facility]. Please call and verify status.

## 2015-07-10 NOTE — Telephone Encounter (Signed)
Order faxed to [facility] on 07/02/15, confirmation received.  Called [facility], unable to speak with anyone. Had to leave a message for Kennyth Lose at [facility] to return my call as to the status of this appointment. Pre cert has been checked with [employer] and no pre cert is required. Waiting on Kennyth Lose to return my call. Rhonda J Cobb

## 2015-07-10 NOTE — Telephone Encounter (Signed)
Called and spoke with pt's wife and she stated that [facility] told them that they haven't received anything from [place] requesting a study.  Advised wife that order and records were re-faxed (confirmation received again) that [facility] should contact them to arrange this study. Phone note left open to ensure appointment has been scheduled. Rhonda J Cobb

## 2015-07-10 NOTE — Telephone Encounter (Signed)
Kennyth Lose with [facility] returned my call and stated that she didn't have any record of the referral even though we have confirmation that the order and records were sent.  Kennyth Lose also stated that she contacted the patient and advised the patient that [facility] didn't receive an order.  I re-faxed my referral along with ov note, ins card, authorization notice from [employer] that pre cert was not required and confirmation was received again that fax went through.  Waiting on appointment. Rhonda J Cobb

## 2015-07-11 NOTE — Telephone Encounter (Signed)
Called and spoke with patient and he confirmed appointment on 07/25/15. Pt is aware. Nothing else needed at this time. Rhonda J Cobb

## 2015-07-11 NOTE — Telephone Encounter (Signed)
Per fax confirmation from [facility], patient has been scheduled for 07/25/15. Pt is aware of appointment. Rhonda J Cobb

## 2015-07-22 DIAGNOSIS — Z950 Presence of cardiac pacemaker: Secondary | ICD-10-CM

## 2015-07-22 HISTORY — DX: Presence of cardiac pacemaker: Z95.0

## 2015-07-25 ENCOUNTER — Ambulatory Visit: Payer: Medicare HMO | Attending: Pulmonary Disease

## 2015-07-25 DIAGNOSIS — E785 Hyperlipidemia, unspecified: Secondary | ICD-10-CM | POA: Diagnosis not present

## 2015-07-25 DIAGNOSIS — I2510 Atherosclerotic heart disease of native coronary artery without angina pectoris: Secondary | ICD-10-CM | POA: Insufficient documentation

## 2015-07-25 DIAGNOSIS — G4733 Obstructive sleep apnea (adult) (pediatric): Secondary | ICD-10-CM | POA: Insufficient documentation

## 2015-07-25 DIAGNOSIS — Z87891 Personal history of nicotine dependence: Secondary | ICD-10-CM | POA: Diagnosis not present

## 2015-07-25 DIAGNOSIS — I4891 Unspecified atrial fibrillation: Secondary | ICD-10-CM | POA: Diagnosis not present

## 2015-07-25 DIAGNOSIS — C9110 Chronic lymphocytic leukemia of B-cell type not having achieved remission: Secondary | ICD-10-CM | POA: Insufficient documentation

## 2015-07-25 DIAGNOSIS — J449 Chronic obstructive pulmonary disease, unspecified: Secondary | ICD-10-CM | POA: Insufficient documentation

## 2015-07-27 ENCOUNTER — Ambulatory Visit (INDEPENDENT_AMBULATORY_CARE_PROVIDER_SITE_OTHER): Payer: Medicare HMO | Admitting: *Deleted

## 2015-07-27 DIAGNOSIS — J449 Chronic obstructive pulmonary disease, unspecified: Secondary | ICD-10-CM

## 2015-07-27 LAB — PULMONARY FUNCTION TEST
DL/VA % pred: 68 %
DL/VA: 3.09 ml/min/mmHg/L
DLCO UNC: 14.25 ml/min/mmHg
DLCO unc % pred: 45 %
FEF 25-75 POST: 0.81 L/s
FEF 25-75 Pre: 1.41 L/sec
FEF2575-%CHANGE-POST: -42 %
FEF2575-%PRED-POST: 34 %
FEF2575-%PRED-PRE: 59 %
FEV1-%CHANGE-POST: -14 %
FEV1-%PRED-PRE: 70 %
FEV1-%Pred-Post: 60 %
FEV1-POST: 1.89 L
FEV1-PRE: 2.21 L
FEV1FVC-%CHANGE-POST: -6 %
FEV1FVC-%PRED-PRE: 94 %
FEV6-%Change-Post: -13 %
FEV6-%Pred-Post: 67 %
FEV6-%Pred-Pre: 78 %
FEV6-POST: 2.7 L
FEV6-Pre: 3.13 L
FEV6FVC-%Change-Post: 1 %
FEV6FVC-%Pred-Post: 105 %
FEV6FVC-%Pred-Pre: 104 %
FVC-%Change-Post: -8 %
FVC-%PRED-PRE: 74 %
FVC-%Pred-Post: 68 %
FVC-POST: 2.9 L
FVC-PRE: 3.18 L
POST FEV1/FVC RATIO: 65 %
PRE FEV1/FVC RATIO: 70 %
PRE FEV6/FVC RATIO: 98 %
Post FEV6/FVC ratio: 99 %

## 2015-07-27 NOTE — Progress Notes (Signed)
PFT performed today. 

## 2015-07-30 ENCOUNTER — Other Ambulatory Visit: Payer: Self-pay | Admitting: Pulmonary Disease

## 2015-07-30 ENCOUNTER — Encounter: Payer: Self-pay | Admitting: Pulmonary Disease

## 2015-07-30 ENCOUNTER — Ambulatory Visit
Admission: RE | Admit: 2015-07-30 | Discharge: 2015-07-30 | Disposition: A | Discharge status: Home / Self Care | Payer: Medicare HMO | Source: Ambulatory Visit | Attending: Pulmonary Disease | Admitting: Pulmonary Disease

## 2015-07-30 ENCOUNTER — Ambulatory Visit (INDEPENDENT_AMBULATORY_CARE_PROVIDER_SITE_OTHER): Payer: Medicare HMO | Admitting: Pulmonary Disease

## 2015-07-30 ENCOUNTER — Ambulatory Visit: Payer: Medicare Other | Admitting: Pulmonary Disease

## 2015-07-30 VITALS — BP 132/74 | HR 54 | Ht 69.0 in | Wt 228.8 lb

## 2015-07-30 DIAGNOSIS — J449 Chronic obstructive pulmonary disease, unspecified: Secondary | ICD-10-CM | POA: Insufficient documentation

## 2015-07-30 DIAGNOSIS — R0600 Dyspnea, unspecified: Secondary | ICD-10-CM | POA: Diagnosis not present

## 2015-07-30 DIAGNOSIS — G4733 Obstructive sleep apnea (adult) (pediatric): Secondary | ICD-10-CM

## 2015-07-30 IMAGING — CR DG CHEST 2V
2 series · 2 of 2 positions shown · non-contrast
Comparison: [DATE]

CLINICAL DATA: COPD

EXAM:
CHEST  2 VIEW

[chest pa]
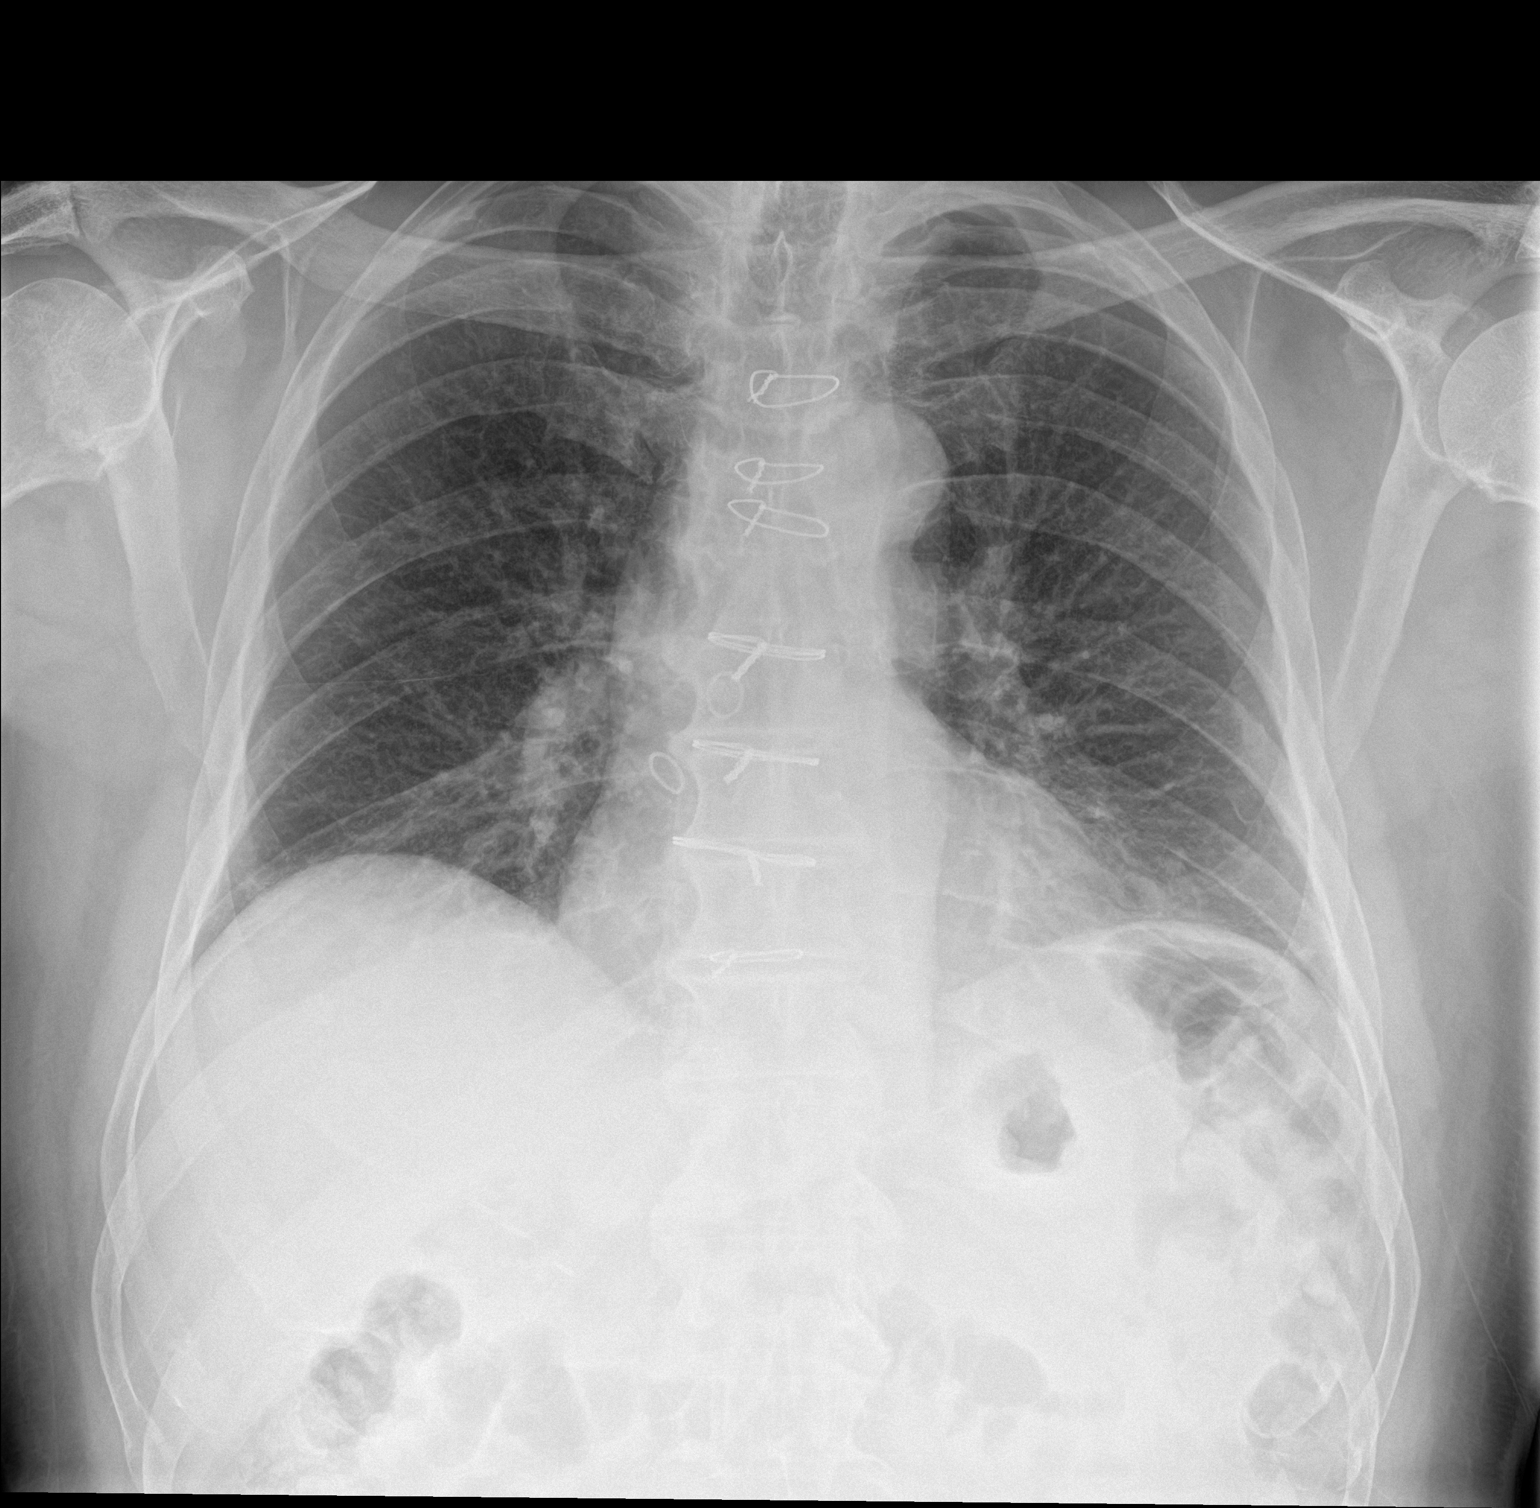

[chest lat]
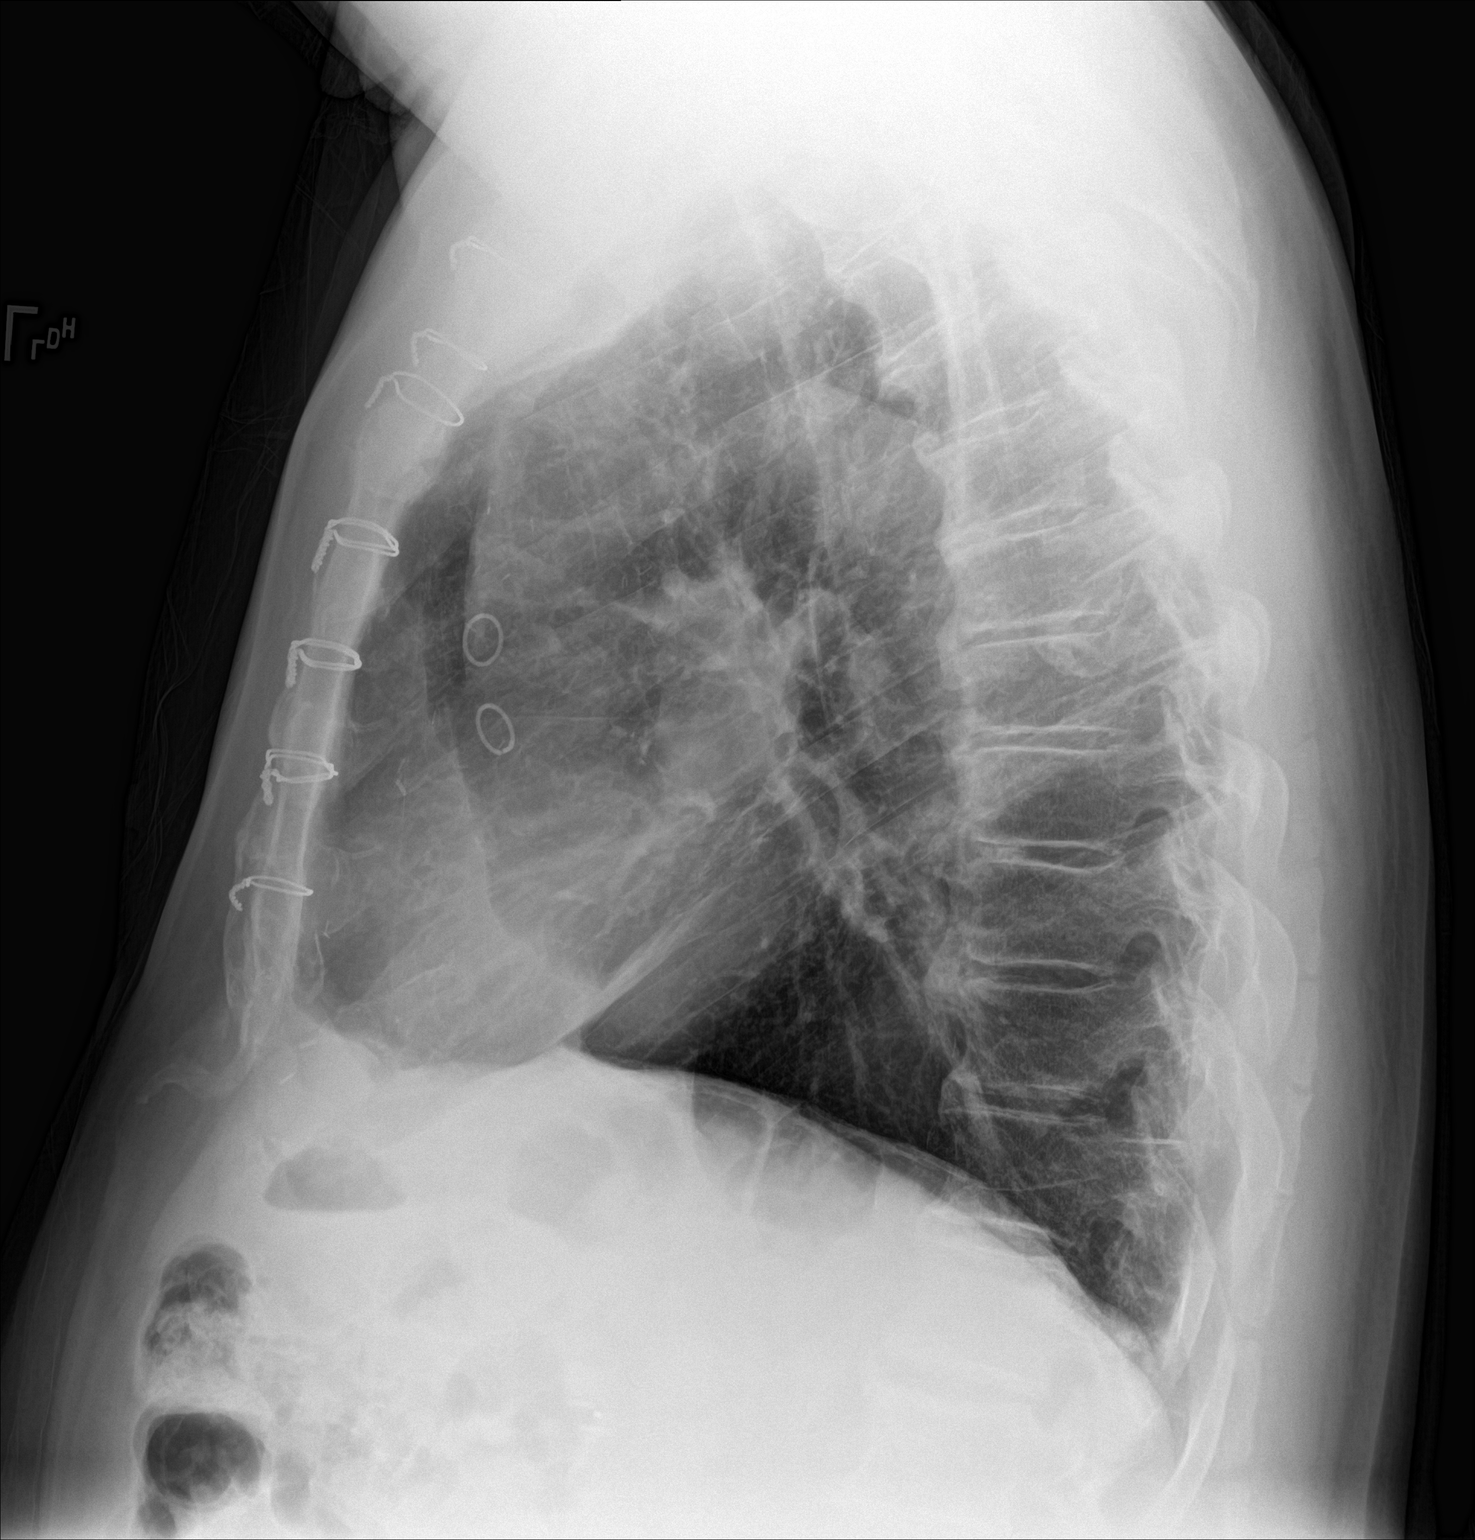

[2 of 2 positions shown; findings below may reference images not displayed]

FINDINGS: COPD with mild hyperinflation. Negative for pneumonia or mass or
pleural effusion. Mild scarring in the left lung base.

Prior CABG.  Heart size normal without heart failure.
IMPRESSION: Mild COPD with probable scarring in the left lung base. No
superimposed acute abnormality.

## 2015-07-30 NOTE — Progress Notes (Signed)
PULMONARY OFFICE PROGRESS NOTE  Date of initial consultation: 06/28/15 Reason for consultation: Dyspnea, history of COPD (previously managed by Dr Lake Bells)  Initial HPI (06/27/05):  76 M with history of COPD, previosuly managed by Dr Lake Bells (last seen approx 2 yrs ago). Referred by Dr Rockey Situ for re-evaluation of chronic exertional dyspnea. Pt reports progressive dyspnea over the past year. He has occasional chest tightness. He has chronic cough with occasional morning mucus described as "phlegm". Denies F/C/S, hemoptysis, pleuritic CP, orthopnea, PND, LE edema and calf tenderness. He has been prescribed Symbicort which he is using as a rescue inhaler and albuterol which he doesn't use at all. He is also noted to be on amiodarone. In addition, he reports that his wife has noted frequent nocturnal apneas. His sleep quality is poor whic he attributes to arthritis pain. He has moderate daytime sleepiness but has never fallen asleep at critical times such as while driving. Initial impression (06/27/05):  COPD, unspecified Probable OSA Initial plan (06/28/15):  Counseled re: proper use of his inhaler medications. 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 Probable mild obstruction (FEV1 70% pred) with gas trapping (RV 134% pred) and mild-mod decrease in DLCO   SUBJ: Here to review results of PFTs, CXR. PSG has been performed but results are not yet available. Dyspnea on exertion is moderately improved with scheduled use of Symbicort. He is also on Spiriva, PRN albuterol with rare use of albuterol MDI  Filed Vitals:   07/30/15 1214  BP: 132/74  Pulse: 54  Height: 5\' 9"  (1.753 m)  Weight: 228 lb 12.8 oz (103.783 kg)  SpO2: 97%     EXAM:  Gen: WDWN, No  overt respiratory distress HEENT: NCAT, oropharynx normal Neck: Supple without LAN, thyromegaly, JVD Lungs: breath sounds decreased, no wheezes Cardiovascular: Reg rhythm, rate normal, soft systolic murmur  Abdomen: Soft, nontender, normal BS Ext: without clubbing, cyanosis, edema Neuro: no overt deficits  DATA:   BMP Latest Ref Rng 07/05/2015 06/08/2015 02/13/2015  Glucose 70 - 99 mg/dL 116(H) - -  BUN 6 - 23 mg/dL 10 - -  Creatinine 0.40 - 1.50 mg/dL 0.88 0.89 0.86  Sodium 135 - 145 mEq/L 141 - -  Potassium 3.5 - 5.1 mEq/L 4.8 - -  Chloride 96 - 112 mEq/L 105 - -  CO2 19 - 32 mEq/L 31 - -  Calcium 8.4 - 10.5 mg/dL 9.1 - -    CBC Latest Ref Rng 06/08/2015 04/13/2015 02/13/2015  WBC 3.8 - 10.6 K/uL 5.2 6.9 6.0  Hemoglobin 13.0 - 18.0 g/dL 13.6 13.8 13.2  Hematocrit 40.0 - 52.0 % 39.8(L) 39.9(L) 38.8(L)  Platelets 150 - 440 K/uL 142(L) 145(L) 128(L)    CXR (07/30/15): NACPD, 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 Probable mild obstruction (FEV1 70% pred) with gas trapping (RV 134% pred) and mild-mod decrease in DLCO  IMPRESSION:     ICD-9-CM ICD-10-CM   1. Dyspnea 786.09 R06.00   2. Chronic obstructive pulmonary disease, unspecified COPD type ([place]) 496 J44.9   3. OSA (obstructive sleep apnea) 327.23 G47.33      PLAN:  Cont Symbicort, Spiriva and PRN albuterol We will call with results of PSG when available and respond accordingly. If  mild OSA, can probably treat with weight loss, etc. If moderate or severe, will initiate CPAP ROV 3-4 months or sooner as needed   Outpatient Encounter Prescriptions as of 07/30/2015  Medication Sig  . amiodarone (PACERONE) 200 MG tablet Take 1 tablet (200 mg total) by mouth 2 (two) times daily as needed.  Marland Kitchen amLODipine (NORVASC) 10 MG tablet Take 0.5 tablets (5 mg total) by mouth daily.  Marland Kitchen apixaban (ELIQUIS) 5 MG TABS tablet Take 1 tablet (5 mg total) by mouth 2 (two) times daily.  Marland Kitchen atorvastatin (LIPITOR) 80  MG tablet Take 0.5 tablets (40 mg total) by mouth daily.  . budesonide-formoterol (SYMBICORT) 160-4.5 MCG/ACT inhaler Inhale 2 puffs into the lungs 2 (two) times daily as needed.  . Coenzyme Q10 (CO Q-10) 100 MG CAPS Take 100 mg by mouth daily.  Marland Kitchen ezetimibe (ZETIA) 10 MG tablet Take 1 tablet (10 mg total) by mouth daily.  . metoprolol tartrate (LOPRESSOR) 50 MG tablet Take 1 tablet (50 mg total) by mouth 2 (two) times daily.  . Multiple Vitamin (MULTI VITAMIN MENS PO) Take 1 tablet by mouth daily.  . nitroGLYCERIN (NITROSTAT) 0.4 MG SL tablet Place 0.4 mg under the tongue every 5 (five) minutes as needed.  . tiotropium (SPIRIVA HANDIHALER) 18 MCG inhalation capsule Place 1 capsule (18 mcg total) into inhaler and inhale daily.  . traMADol (ULTRAM) 50 MG tablet Take 1 tablet (50 mg total) by mouth every 6 (six) hours as needed.  . [DISCONTINUED] ondansetron (ZOFRAN) 8 MG tablet Take by mouth every 8 (eight) hours as needed for nausea or vomiting. Reported on 07/30/2015   No facility-administered encounter medications on file as of 07/30/2015.    Wilhelmina Mcardle, MD [facility], Critical Care Medicine

## 2015-07-31 ENCOUNTER — Other Ambulatory Visit: Payer: Self-pay | Admitting: *Deleted

## 2015-07-31 DIAGNOSIS — C9110 Chronic lymphocytic leukemia of B-cell type not having achieved remission: Secondary | ICD-10-CM

## 2015-08-03 ENCOUNTER — Inpatient Hospital Stay (HOSPITAL_BASED_OUTPATIENT_CLINIC_OR_DEPARTMENT_OTHER): Payer: Medicare HMO | Admitting: Internal Medicine

## 2015-08-03 ENCOUNTER — Encounter: Payer: Self-pay | Admitting: Internal Medicine

## 2015-08-03 ENCOUNTER — Inpatient Hospital Stay: Payer: Medicare HMO

## 2015-08-03 ENCOUNTER — Inpatient Hospital Stay: Payer: Medicare HMO | Attending: Internal Medicine

## 2015-08-03 VITALS — BP 116/68 | HR 49 | Temp 97.2°F | Resp 22 | Wt 226.6 lb

## 2015-08-03 DIAGNOSIS — I4891 Unspecified atrial fibrillation: Secondary | ICD-10-CM

## 2015-08-03 DIAGNOSIS — M25562 Pain in left knee: Secondary | ICD-10-CM | POA: Diagnosis not present

## 2015-08-03 DIAGNOSIS — I10 Essential (primary) hypertension: Secondary | ICD-10-CM | POA: Diagnosis not present

## 2015-08-03 DIAGNOSIS — J449 Chronic obstructive pulmonary disease, unspecified: Secondary | ICD-10-CM

## 2015-08-03 DIAGNOSIS — F17210 Nicotine dependence, cigarettes, uncomplicated: Secondary | ICD-10-CM

## 2015-08-03 DIAGNOSIS — C9110 Chronic lymphocytic leukemia of B-cell type not having achieved remission: Secondary | ICD-10-CM

## 2015-08-03 DIAGNOSIS — M170 Bilateral primary osteoarthritis of knee: Secondary | ICD-10-CM

## 2015-08-03 DIAGNOSIS — E7800 Pure hypercholesterolemia, unspecified: Secondary | ICD-10-CM | POA: Diagnosis not present

## 2015-08-03 DIAGNOSIS — M129 Arthropathy, unspecified: Secondary | ICD-10-CM | POA: Diagnosis not present

## 2015-08-03 DIAGNOSIS — E785 Hyperlipidemia, unspecified: Secondary | ICD-10-CM | POA: Insufficient documentation

## 2015-08-03 DIAGNOSIS — Z5112 Encounter for antineoplastic immunotherapy: Secondary | ICD-10-CM | POA: Diagnosis not present

## 2015-08-03 DIAGNOSIS — Z7901 Long term (current) use of anticoagulants: Secondary | ICD-10-CM

## 2015-08-03 DIAGNOSIS — M25561 Pain in right knee: Secondary | ICD-10-CM | POA: Insufficient documentation

## 2015-08-03 DIAGNOSIS — R69 Illness, unspecified: Secondary | ICD-10-CM | POA: Diagnosis not present

## 2015-08-03 DIAGNOSIS — I2510 Atherosclerotic heart disease of native coronary artery without angina pectoris: Secondary | ICD-10-CM | POA: Diagnosis not present

## 2015-08-03 DIAGNOSIS — Z79899 Other long term (current) drug therapy: Secondary | ICD-10-CM

## 2015-08-03 LAB — CBC WITH DIFFERENTIAL/PLATELET
Basophils Absolute: 0.1 10*3/uL (ref 0–0.1)
Basophils Relative: 1 %
EOS PCT: 5 %
Eosinophils Absolute: 0.3 10*3/uL (ref 0–0.7)
HEMATOCRIT: 40.5 % (ref 40.0–52.0)
HEMOGLOBIN: 13.8 g/dL (ref 13.0–18.0)
LYMPHS ABS: 1.2 10*3/uL (ref 1.0–3.6)
LYMPHS PCT: 18 %
MCH: 31.7 pg (ref 26.0–34.0)
MCHC: 34.1 g/dL (ref 32.0–36.0)
MCV: 93 fL (ref 80.0–100.0)
MONO ABS: 0.8 10*3/uL (ref 0.2–1.0)
Monocytes Relative: 11 %
Neutro Abs: 4.4 10*3/uL (ref 1.4–6.5)
Neutrophils Relative %: 65 %
Platelets: 144 10*3/uL — ABNORMAL LOW (ref 150–440)
RBC: 4.35 MIL/uL — ABNORMAL LOW (ref 4.40–5.90)
RDW: 13.9 % (ref 11.5–14.5)
WBC: 6.7 10*3/uL (ref 3.8–10.6)

## 2015-08-03 LAB — CREATININE, SERUM: Creatinine, Ser: 0.78 mg/dL (ref 0.61–1.24)

## 2015-08-03 LAB — HEPATIC FUNCTION PANEL
ALBUMIN: 4.2 g/dL (ref 3.5–5.0)
ALT: 22 U/L (ref 17–63)
AST: 19 U/L (ref 15–41)
Alkaline Phosphatase: 76 U/L (ref 38–126)
BILIRUBIN TOTAL: 1.1 mg/dL (ref 0.3–1.2)
Bilirubin, Direct: <0.1 mg/dL — ABNORMAL LOW (ref 0.1–0.5)
Total Protein: 6.7 g/dL (ref 6.5–8.1)

## 2015-08-03 MED ORDER — DIPHENHYDRAMINE HCL 25 MG PO CAPS
50.0000 mg | ORAL_CAPSULE | Freq: Once | ORAL | Status: AC
  Administered 2015-08-03: 50 mg via ORAL
  Filled 2015-08-03: qty 2

## 2015-08-03 MED ORDER — ACETAMINOPHEN 325 MG PO TABS
650.0000 mg | ORAL_TABLET | Freq: Once | ORAL | Status: AC
  Administered 2015-08-03: 650 mg via ORAL
  Filled 2015-08-03: qty 2

## 2015-08-03 MED ORDER — TRAMADOL HCL 50 MG PO TABS: 50.0000 mg | ORAL_TABLET | Freq: Four times a day (QID) | ORAL | Status: DC | PRN

## 2015-08-03 MED ORDER — SODIUM CHLORIDE 0.9 % IV SOLN: 375.0000 mg/m2 | Freq: Once | INTRAVENOUS | Status: DC

## 2015-08-03 MED ORDER — SODIUM CHLORIDE 0.9 % IV SOLN
Freq: Once | INTRAVENOUS | Status: AC
  Administered 2015-08-03: 11:00:00 via INTRAVENOUS
  Filled 2015-08-03: qty 1000

## 2015-08-03 MED ORDER — RITUXIMAB CHEMO INJECTION 500 MG/50ML
375.0000 mg/m2 | Freq: Once | INTRAVENOUS | Status: AC
  Administered 2015-08-03: 800 mg via INTRAVENOUS
  Filled 2015-08-03: qty 70

## 2015-08-03 NOTE — Progress Notes (Signed)
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 I reviewed the patient's prior charts/pertinent labs detail; findings are summarized above.    Patient's appetite is good. He denies any weight loss. Denies any night sweats.  Denies any frequent infections. Denies any chest pain or cough.   Patient has chronic bilateral knee pain right more than left.  He takes tramadol for it.   REVIEW OF SYSTEMS:  A complete 10 point review of system is done which is negative except mentioned above/history of present illness.   PAST MEDICAL HISTORY :  Past Medical History  Diagnosis Date  . Coronary artery disease     a. 04/2009 CABG x 3 (LIMA->LAD, VG->OM1, VG->PDA);  b. 09/2009 Caht: occluded VG x 2 w/ patent LIMA and L->R collats. EF 55%, mild antlat HK;  c. 10/2011 MV: EF 53%, no isch/infarct-->low risk.  . Pure hypercholesterolemia   . Hypertension   . Atrial fibrillation ([place])     a. Dx 2013, recurred 02/2014, CHA2DS2VASc = 3 -->placed on Eliquis;  b. 02/2014 Echo: EF 50-55%, mid and apical anterior septum and mid and apical inf septum are abnl, mild to mod Ao sclerosis w/o AS.  Marland Kitchen History of arthritis   . Arthritis   . Chicken pox   . Rheumatic fever   . COPD (chronic obstructive pulmonary disease) ([place])   . Arthritis   . Chronic lymphocytic leukemia ([place])     a. Dx 02/2014.  Marland Kitchen Coronary artery disease     Post CABG 2010, subsequent catheterization 2011 revealed occluded vein grafts X2  . Hyperlipidemia     PAST SURGICAL HISTORY :   Past Surgical History  Procedure Laterality Date  . Cardiac catheterization  2011    Dr Fletcher Anon  .  Cholecystectomy    . Appendectomy  1988  . Tonsillectomy and adenoidectomy  1956  . Coronary artery bypass graft  2010    3v  . Hernia repair      abdominal  . Hernia repair      FAMILY HISTORY :   Family History  Problem Relation Age of Onset  . Coronary artery disease      family history  . Heart disease Mother     SOCIAL HISTORY:   Social History  Substance Use Topics  . Smoking status: Former Smoker -- 1.00 packs/day for 40 years    Types: Cigarettes    Quit date: 07/21/2006  . Smokeless tobacco: Never Used  . Alcohol Use: 0.5 oz/week    1 Standard drinks or equivalent per week     Comment: occasionally    ALLERGIES:  has No Known Allergies.  MEDICATIONS:  Current Outpatient Prescriptions  Medication Sig Dispense Refill  . amiodarone (PACERONE) 200 MG tablet Take 1 tablet (200 mg total) by mouth 2 (two) times daily as needed. 60 tablet 3  . amLODipine (NORVASC) 10 MG tablet Take 0.5 tablets (5 mg total) by mouth daily. 30 tablet 6  . apixaban (ELIQUIS) 5 MG TABS tablet Take 1 tablet (5 mg total) by mouth 2 (two) times daily. 60 tablet 6  . atorvastatin (LIPITOR) 80 MG tablet Take 0.5 tablets (  40 mg total) by mouth daily. 90 tablet 6  . budesonide-formoterol (SYMBICORT) 160-4.5 MCG/ACT inhaler Inhale 2 puffs into the lungs 2 (two) times daily as needed. 1 Inhaler 6  . Coenzyme Q10 (CO Q-10) 100 MG CAPS Take 100 mg by mouth daily.    Marland Kitchen ezetimibe (ZETIA) 10 MG tablet Take 1 tablet (10 mg total) by mouth daily. 90 tablet 3  . metoprolol tartrate (LOPRESSOR) 50 MG tablet Take 1 tablet (50 mg total) by mouth 2 (two) times daily. 180 tablet 3  . Multiple Vitamin (MULTI VITAMIN MENS PO) Take 1 tablet by mouth daily.    . nitroGLYCERIN (NITROSTAT) 0.4 MG SL tablet Place 0.4 mg under the tongue every 5 (five) minutes as needed.    . tiotropium (SPIRIVA HANDIHALER) 18 MCG inhalation capsule Place 1 capsule (18 mcg total) into inhaler and inhale daily. 30 capsule 6  . traMADol  (ULTRAM) 50 MG tablet Take 1 tablet (50 mg total) by mouth every 6 (six) hours as needed. 90 tablet 0  . [DISCONTINUED] isosorbide mononitrate (IMDUR) 30 MG 24 hr tablet Take 1 tablet (30 mg total) by mouth daily. 30 tablet 6   No current facility-administered medications for this visit.    PHYSICAL EXAMINATION: ECOG PERFORMANCE STATUS: 0 - Asymptomatic  BP 116/68 mmHg  Pulse 49  Temp(Src) 97.2 F (36.2 C) (Tympanic)  Wt 226 lb 10.1 oz (102.8 kg)  Filed Weights   08/03/15 0901  Weight: 226 lb 10.1 oz (102.8 kg)    GENERAL: Well-nourished well-developed; Alert, no distress and comfortable.   Accompanied by his daughter.  EYES: no pallor or icterus OROPHARYNX: no thrush or ulceration; good dentition  NECK: supple, no masses felt LYMPH:  no palpable lymphadenopathy in the cervical, axillary or inguinal regions LUNGS: clear to auscultation and  No wheeze or crackles HEART/CVS: regular rate & rhythm and no murmurs; No lower extremity edema ABDOMEN:abdomen soft, non-tender and normal bowel sounds Musculoskeletal:no cyanosis of digits and no clubbing  PSYCH: alert & oriented x 3 with fluent speech NEURO: no focal motor/sensory deficits SKIN:  no rashes or significant lesions  LABORATORY DATA:  I have reviewed the data as listed    Component Value Date/Time   NA 141 07/05/2015 1207   NA 139 10/11/2014 1800   K 4.8 07/05/2015 1207   K 3.3* 10/11/2014 1800   CL 105 07/05/2015 1207   CL 106 10/11/2014 1800   CO2 31 07/05/2015 1207   CO2 27 10/11/2014 1800   GLUCOSE 116* 07/05/2015 1207   GLUCOSE 107* 10/11/2014 1800   BUN 10 07/05/2015 1207   BUN 15 10/11/2014 1800   CREATININE 0.78 08/03/2015 0833   CREATININE 0.89 10/11/2014 1800   CALCIUM 9.1 07/05/2015 1207   CALCIUM 8.8* 10/11/2014 1800   PROT 6.7 08/03/2015 0833   PROT 6.4* 10/11/2014 1800   PROT 6.9 09/09/2013 1151   ALBUMIN 4.2 08/03/2015 0833   ALBUMIN 4.1 10/11/2014 1800   ALBUMIN 4.8 09/09/2013 1151   AST 19  08/03/2015 0833   AST 23 10/11/2014 1800   ALT 22 08/03/2015 0833   ALT 22 10/11/2014 1800   ALKPHOS 76 08/03/2015 0833   ALKPHOS 61 10/11/2014 1800   BILITOT 1.1 08/03/2015 0833   BILITOT 0.9 10/11/2014 1800   GFRNONAA >60 08/03/2015 0833   GFRNONAA >60 10/11/2014 1800   GFRNONAA >60 08/15/2014 1205   GFRAA >60 08/03/2015 0833   GFRAA >60 10/11/2014 1800   GFRAA >60 08/15/2014 1205    No  results found for: SPEP, UPEP  Lab Results  Component Value Date   WBC 6.7 08/03/2015   NEUTROABS 4.4 08/03/2015   HGB 13.8 08/03/2015   HCT 40.5 08/03/2015   MCV 93.0 08/03/2015   PLT 144* 08/03/2015      Chemistry      Component Value Date/Time   NA 141 07/05/2015 1207   NA 139 10/11/2014 1800   K 4.8 07/05/2015 1207   K 3.3* 10/11/2014 1800   CL 105 07/05/2015 1207   CL 106 10/11/2014 1800   CO2 31 07/05/2015 1207   CO2 27 10/11/2014 1800   BUN 10 07/05/2015 1207   BUN 15 10/11/2014 1800   CREATININE 0.78 08/03/2015 0833   CREATININE 0.89 10/11/2014 1800      Component Value Date/Time   CALCIUM 9.1 07/05/2015 1207   CALCIUM 8.8* 10/11/2014 1800   ALKPHOS 76 08/03/2015 0833   ALKPHOS 61 10/11/2014 1800   AST 19 08/03/2015 0833   AST 23 10/11/2014 1800   ALT 22 08/03/2015 0833   ALT 22 10/11/2014 1800   BILITOT 1.1 08/03/2015 0833   BILITOT 0.9 10/11/2014 1800       RADIOGRAPHIC STUDIES: I have personally reviewed the radiological images as listed and agreed with the findings in the report. No results found.   ASSESSMENT & PLAN:   # CLL/SLL- status post bendamustine rituximab 6 cycles [finished in April 2016]- currently on maintenance rituximab every 2 months.  CBC is within normal limits except for platelet count slightly low at 144.  Creatinine normal.  Plan CT scans of Chest/Ab-Pelvis/Neck in appx 2 months/ prior to next visit.  Discussed that based upon the plans at next visit-  We will  Decide whether to continue Rituxan versus holding it.  Patient's daughter  was leaning towards continuation of Rituxan.  # Right knee pain- on tramadol;  New prescription given. Patient needs it only once in a while.   # # 25 minutes face-to-face with the patient discussing the above plan of care; more than 50% of time spent on prognosis/ natural history; counseling and coordination.      Cammie Sickle, MD 08/03/2015 9:44 AM

## 2015-08-09 ENCOUNTER — Telehealth: Payer: Self-pay | Admitting: *Deleted

## 2015-08-09 NOTE — Telephone Encounter (Signed)
Dr. Donivan Scull last office note discussing starting Zetia routed to [facility].  Pt will need to contact PCP to share labs, as Dr. Gilford Rile last checked lipids.

## 2015-08-09 NOTE — Telephone Encounter (Signed)
Patient needs labs to

## 2015-08-09 NOTE — Telephone Encounter (Signed)
Pt is calling stating he is at the [place]  Trying to get a medication approved but he is having the problem. He is asking if we can fax over the  labs to them so they can approve it And needs letter stating that he needs letter or cover sheet saying he is to be on Zetia   Please fax to [phone]

## 2015-08-15 DIAGNOSIS — G4733 Obstructive sleep apnea (adult) (pediatric): Secondary | ICD-10-CM | POA: Diagnosis not present

## 2015-08-16 ENCOUNTER — Telehealth: Payer: Self-pay | Admitting: *Deleted

## 2015-08-16 DIAGNOSIS — G4733 Obstructive sleep apnea (adult) (pediatric): Secondary | ICD-10-CM

## 2015-08-16 NOTE — Telephone Encounter (Signed)
Pt informed order for CPAP titration is being placed.

## 2015-08-22 ENCOUNTER — Encounter: Payer: Self-pay | Admitting: Emergency Medicine

## 2015-08-22 ENCOUNTER — Observation Stay
Admission: EM | Admit: 2015-08-22 | Discharge: 2015-08-23 | Disposition: A | Discharge status: Home / Self Care | Payer: Medicare HMO | Attending: Internal Medicine | Admitting: Internal Medicine

## 2015-08-22 ENCOUNTER — Emergency Department: Payer: Medicare HMO

## 2015-08-22 DIAGNOSIS — R001 Bradycardia, unspecified: Secondary | ICD-10-CM | POA: Insufficient documentation

## 2015-08-22 DIAGNOSIS — J449 Chronic obstructive pulmonary disease, unspecified: Secondary | ICD-10-CM | POA: Diagnosis not present

## 2015-08-22 DIAGNOSIS — Z7901 Long term (current) use of anticoagulants: Secondary | ICD-10-CM | POA: Diagnosis not present

## 2015-08-22 DIAGNOSIS — R42 Dizziness and giddiness: Secondary | ICD-10-CM | POA: Diagnosis not present

## 2015-08-22 DIAGNOSIS — I491 Atrial premature depolarization: Secondary | ICD-10-CM | POA: Diagnosis not present

## 2015-08-22 DIAGNOSIS — Z87891 Personal history of nicotine dependence: Secondary | ICD-10-CM | POA: Insufficient documentation

## 2015-08-22 DIAGNOSIS — Z9049 Acquired absence of other specified parts of digestive tract: Secondary | ICD-10-CM | POA: Insufficient documentation

## 2015-08-22 DIAGNOSIS — R079 Chest pain, unspecified: Secondary | ICD-10-CM | POA: Diagnosis present

## 2015-08-22 DIAGNOSIS — D72829 Elevated white blood cell count, unspecified: Secondary | ICD-10-CM | POA: Diagnosis not present

## 2015-08-22 DIAGNOSIS — R161 Splenomegaly, not elsewhere classified: Secondary | ICD-10-CM | POA: Insufficient documentation

## 2015-08-22 DIAGNOSIS — Z8249 Family history of ischemic heart disease and other diseases of the circulatory system: Secondary | ICD-10-CM | POA: Diagnosis not present

## 2015-08-22 DIAGNOSIS — R918 Other nonspecific abnormal finding of lung field: Secondary | ICD-10-CM | POA: Insufficient documentation

## 2015-08-22 DIAGNOSIS — R55 Syncope and collapse: Secondary | ICD-10-CM | POA: Insufficient documentation

## 2015-08-22 DIAGNOSIS — G4733 Obstructive sleep apnea (adult) (pediatric): Secondary | ICD-10-CM | POA: Diagnosis not present

## 2015-08-22 DIAGNOSIS — M170 Bilateral primary osteoarthritis of knee: Secondary | ICD-10-CM | POA: Insufficient documentation

## 2015-08-22 DIAGNOSIS — R531 Weakness: Secondary | ICD-10-CM | POA: Diagnosis not present

## 2015-08-22 DIAGNOSIS — Z951 Presence of aortocoronary bypass graft: Secondary | ICD-10-CM | POA: Diagnosis not present

## 2015-08-22 DIAGNOSIS — I2510 Atherosclerotic heart disease of native coronary artery without angina pectoris: Principal | ICD-10-CM | POA: Insufficient documentation

## 2015-08-22 DIAGNOSIS — F4310 Post-traumatic stress disorder, unspecified: Secondary | ICD-10-CM | POA: Insufficient documentation

## 2015-08-22 DIAGNOSIS — Z79899 Other long term (current) drug therapy: Secondary | ICD-10-CM | POA: Diagnosis not present

## 2015-08-22 DIAGNOSIS — H538 Other visual disturbances: Secondary | ICD-10-CM | POA: Diagnosis not present

## 2015-08-22 DIAGNOSIS — I480 Paroxysmal atrial fibrillation: Secondary | ICD-10-CM | POA: Insufficient documentation

## 2015-08-22 DIAGNOSIS — I10 Essential (primary) hypertension: Secondary | ICD-10-CM | POA: Insufficient documentation

## 2015-08-22 DIAGNOSIS — R0602 Shortness of breath: Secondary | ICD-10-CM | POA: Diagnosis not present

## 2015-08-22 DIAGNOSIS — C9110 Chronic lymphocytic leukemia of B-cell type not having achieved remission: Secondary | ICD-10-CM | POA: Diagnosis not present

## 2015-08-22 DIAGNOSIS — R69 Illness, unspecified: Secondary | ICD-10-CM | POA: Diagnosis not present

## 2015-08-22 DIAGNOSIS — E7800 Pure hypercholesterolemia, unspecified: Secondary | ICD-10-CM | POA: Diagnosis not present

## 2015-08-22 LAB — HEPATIC FUNCTION PANEL
ALK PHOS: 76 U/L (ref 38–126)
ALT: 26 U/L (ref 17–63)
AST: 23 U/L (ref 15–41)
Albumin: 4.4 g/dL (ref 3.5–5.0)
BILIRUBIN DIRECT: 0.1 mg/dL (ref 0.1–0.5)
BILIRUBIN INDIRECT: 0.7 mg/dL (ref 0.3–0.9)
Total Bilirubin: 0.8 mg/dL (ref 0.3–1.2)
Total Protein: 7 g/dL (ref 6.5–8.1)

## 2015-08-22 LAB — TROPONIN I
Troponin I: <0.03 ng/mL (ref <0.031)
Troponin I: <0.03 ng/mL (ref <0.031)

## 2015-08-22 LAB — CBC
HCT: 40.5 % (ref 40.0–52.0)
HEMOGLOBIN: 13.8 g/dL (ref 13.0–18.0)
MCH: 31.9 pg (ref 26.0–34.0)
MCHC: 34.1 g/dL (ref 32.0–36.0)
MCV: 93.4 fL (ref 80.0–100.0)
PLATELETS: 132 10*3/uL — AB (ref 150–440)
RBC: 4.33 MIL/uL — AB (ref 4.40–5.90)
RDW: 14 % (ref 11.5–14.5)
WBC: 6.8 10*3/uL (ref 3.8–10.6)

## 2015-08-22 LAB — BASIC METABOLIC PANEL
ANION GAP: 2 — AB (ref 5–15)
BUN: 11 mg/dL (ref 6–20)
CALCIUM: 8.8 mg/dL — AB (ref 8.9–10.3)
CO2: 27 mmol/L (ref 22–32)
Chloride: 112 mmol/L — ABNORMAL HIGH (ref 101–111)
Creatinine, Ser: 0.93 mg/dL (ref 0.61–1.24)
GFR calc Af Amer: >60 mL/min (ref >60)
GFR calc non Af Amer: >60 mL/min (ref >60)
GLUCOSE: 113 mg/dL — AB (ref 65–99)
Potassium: 4.1 mmol/L (ref 3.5–5.1)
Sodium: 141 mmol/L (ref 135–145)

## 2015-08-22 IMAGING — CT CT HEAD W/O CM
1 series · 16 of 30 positions shown, 20 images · non-contrast
Comparison: None.

CLINICAL DATA: Generalized weakness, near syncope.

EXAM:
CT HEAD WITHOUT CONTRAST
TECHNIQUE: Contiguous axial images were obtained from the base of the skull
through the vertex without intravenous contrast.

[Series 2: soft tissue · axial · 0.42mm/px · z∈[+425,+575]mm · 16 of 34 slices shown, 20 images]
[im 2/34  brain]
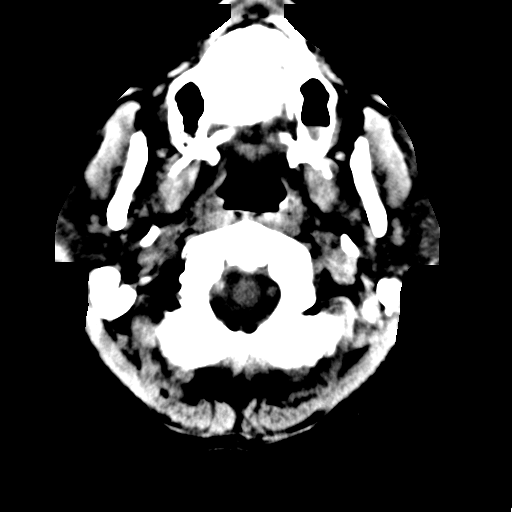
[im 2/34  bone]
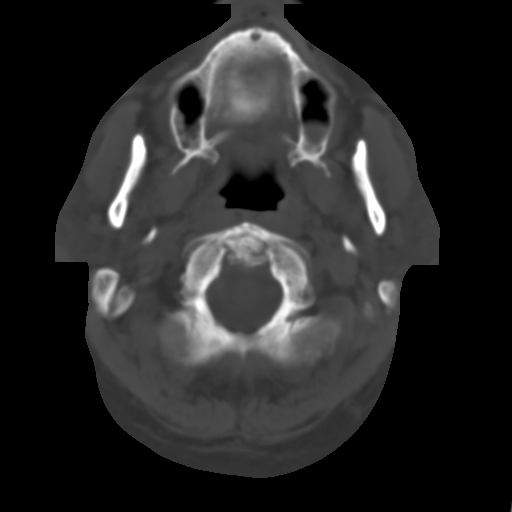
[im 4/34  brain]
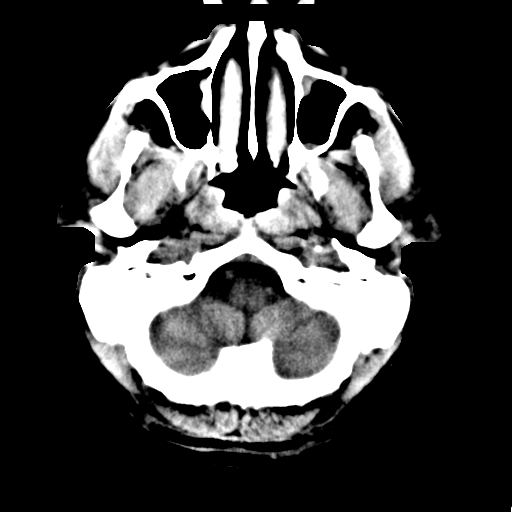
[im 6/34  brain]
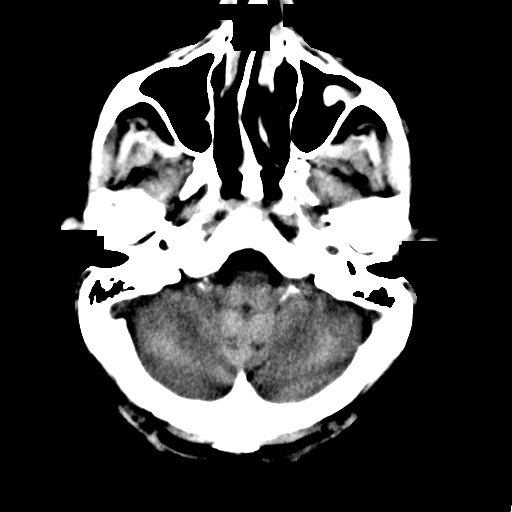
[im 8/34  brain]
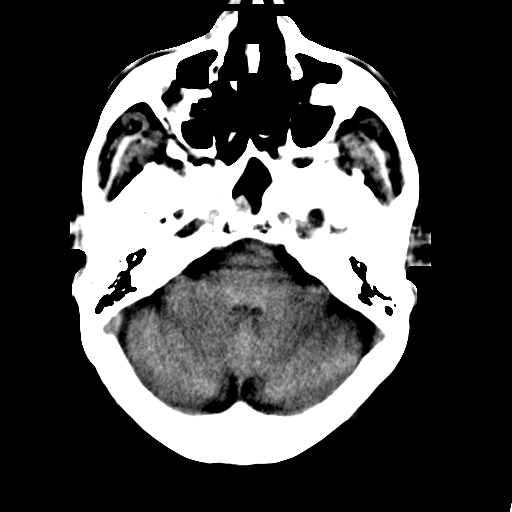
[im 10/34  brain]
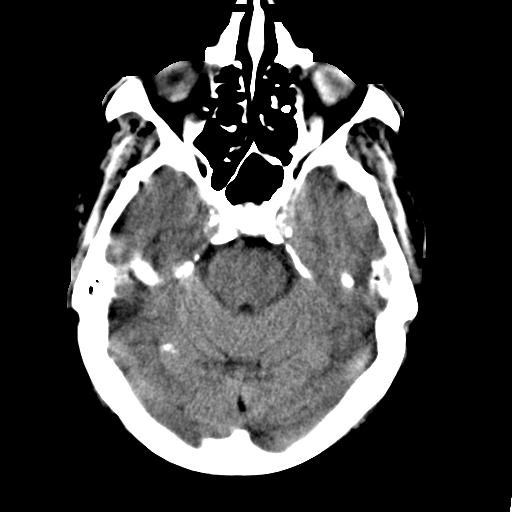
[im 10/34  bone]
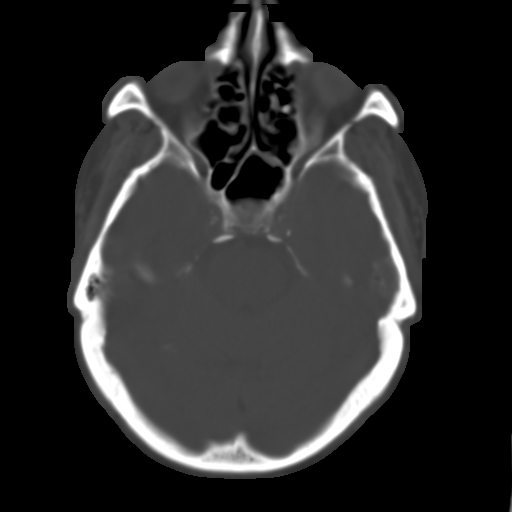
[im 12/34  brain]
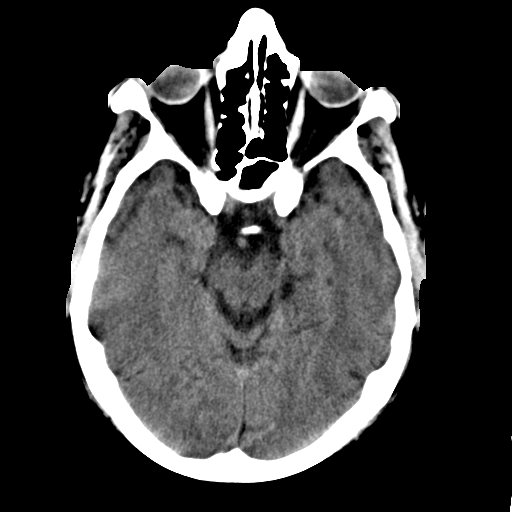
[im 14/34  brain]
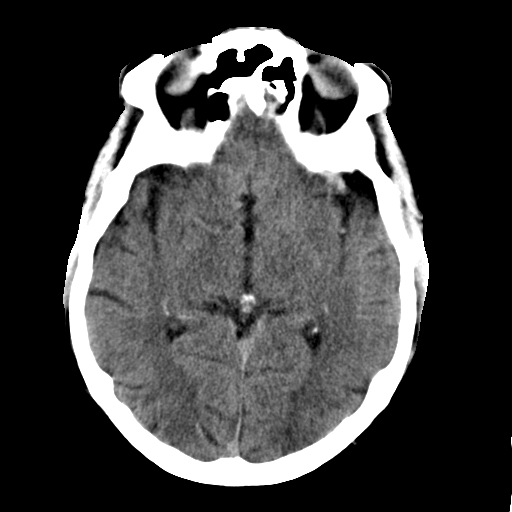
[im 16/34  brain]
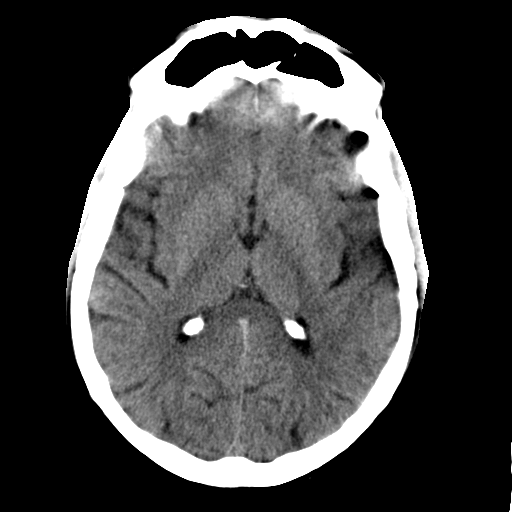
[im 18/34  brain]
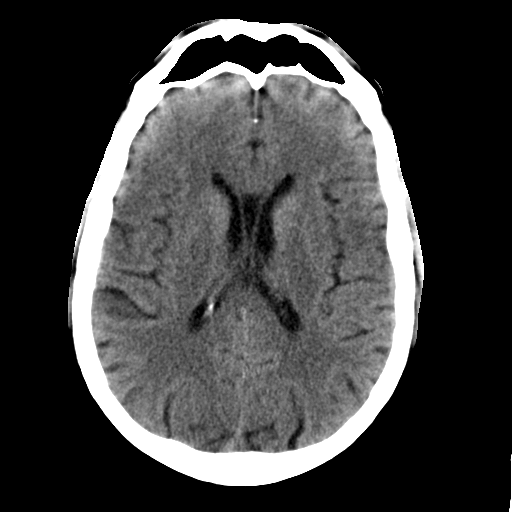
[im 18/34  bone]
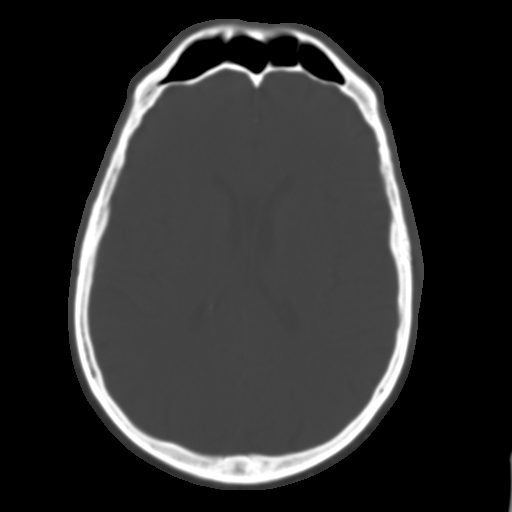
[im 20/34  brain]
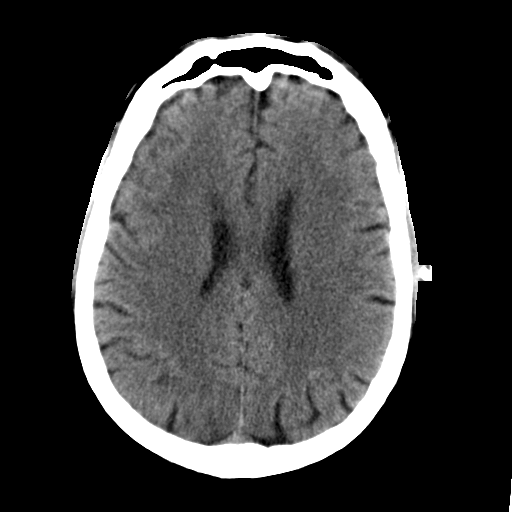
[im 22/34  brain]
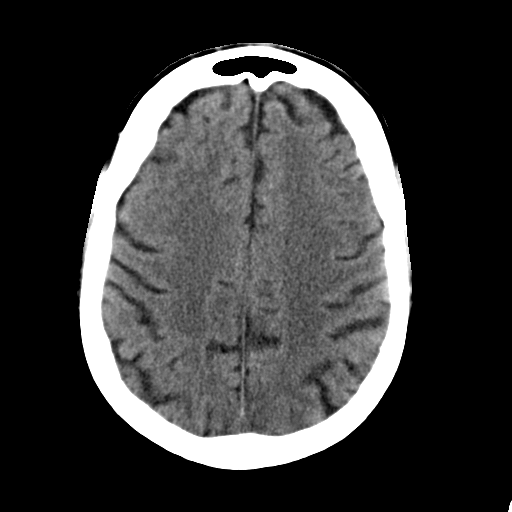
[im 24/34  brain]
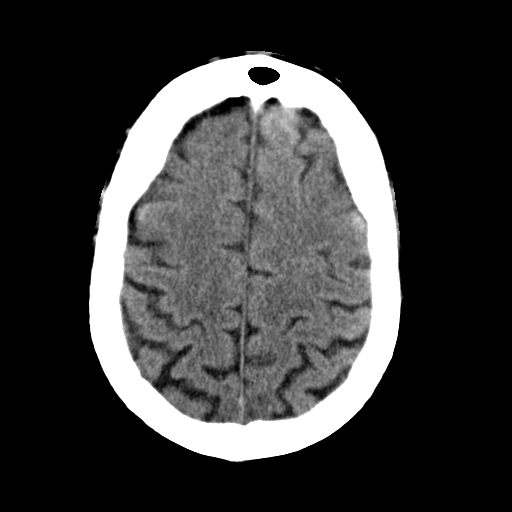
[im 26/34  brain]
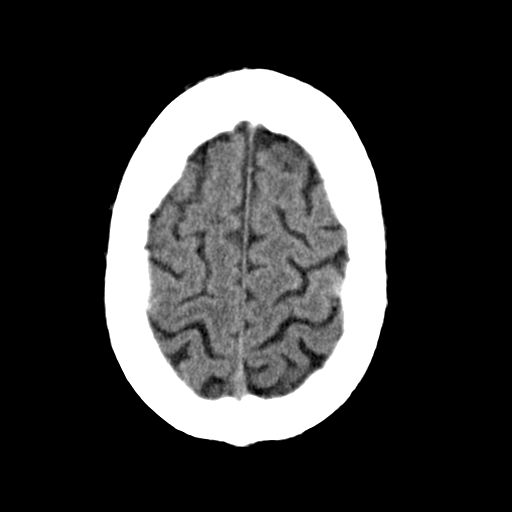
[im 26/34  bone]
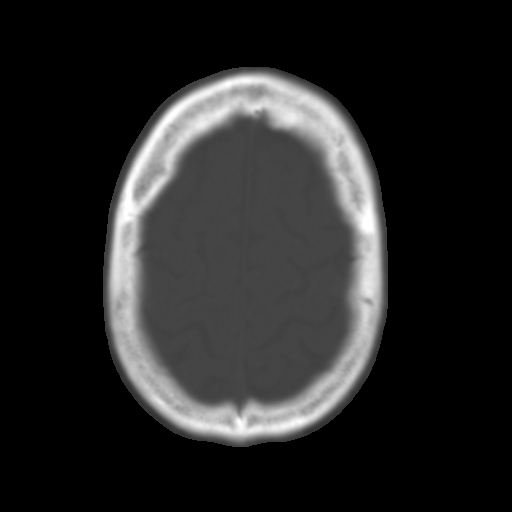
[im 28/34  brain]
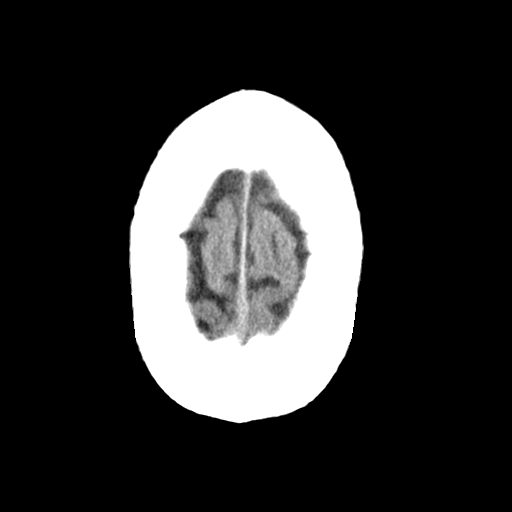
[im 30/34  brain]
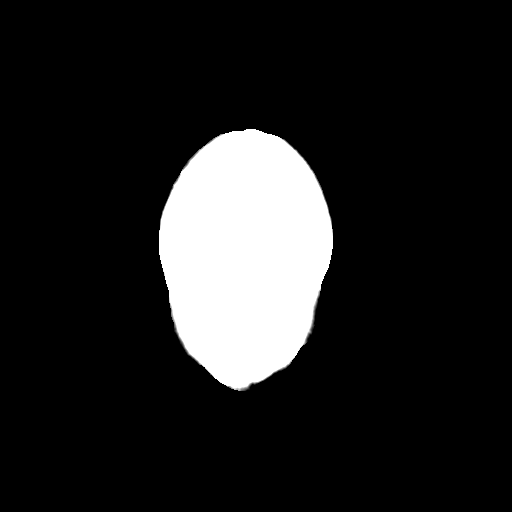
[im 32/34  brain]
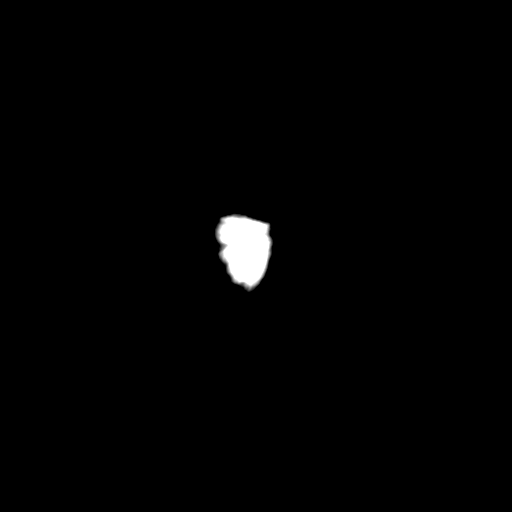

[16 of 30 positions shown; findings below may reference images not displayed]

FINDINGS: Bony calvarium appears intact. Mucous retention cysts are noted in
the maxillary sinuses bilaterally. Minimal diffuse cortical atrophy
is noted. Mild chronic ischemic white matter disease is noted. No
mass effect or midline shift is noted. Ventricular size is within
normal limits. There is no evidence of mass lesion, hemorrhage or
acute infarction.
IMPRESSION: Minimal diffuse cortical atrophy. Mild chronic ischemic white matter
disease. No acute intracranial abnormality seen.

## 2015-08-22 IMAGING — CR DG CHEST 2V
2 series · 3 of 3 positions shown · non-contrast
Comparison: [DATE]

CLINICAL DATA: Increasing weakness, chest pain and shortness
breath.

EXAM:
CHEST  2 VIEW

[chest lat]
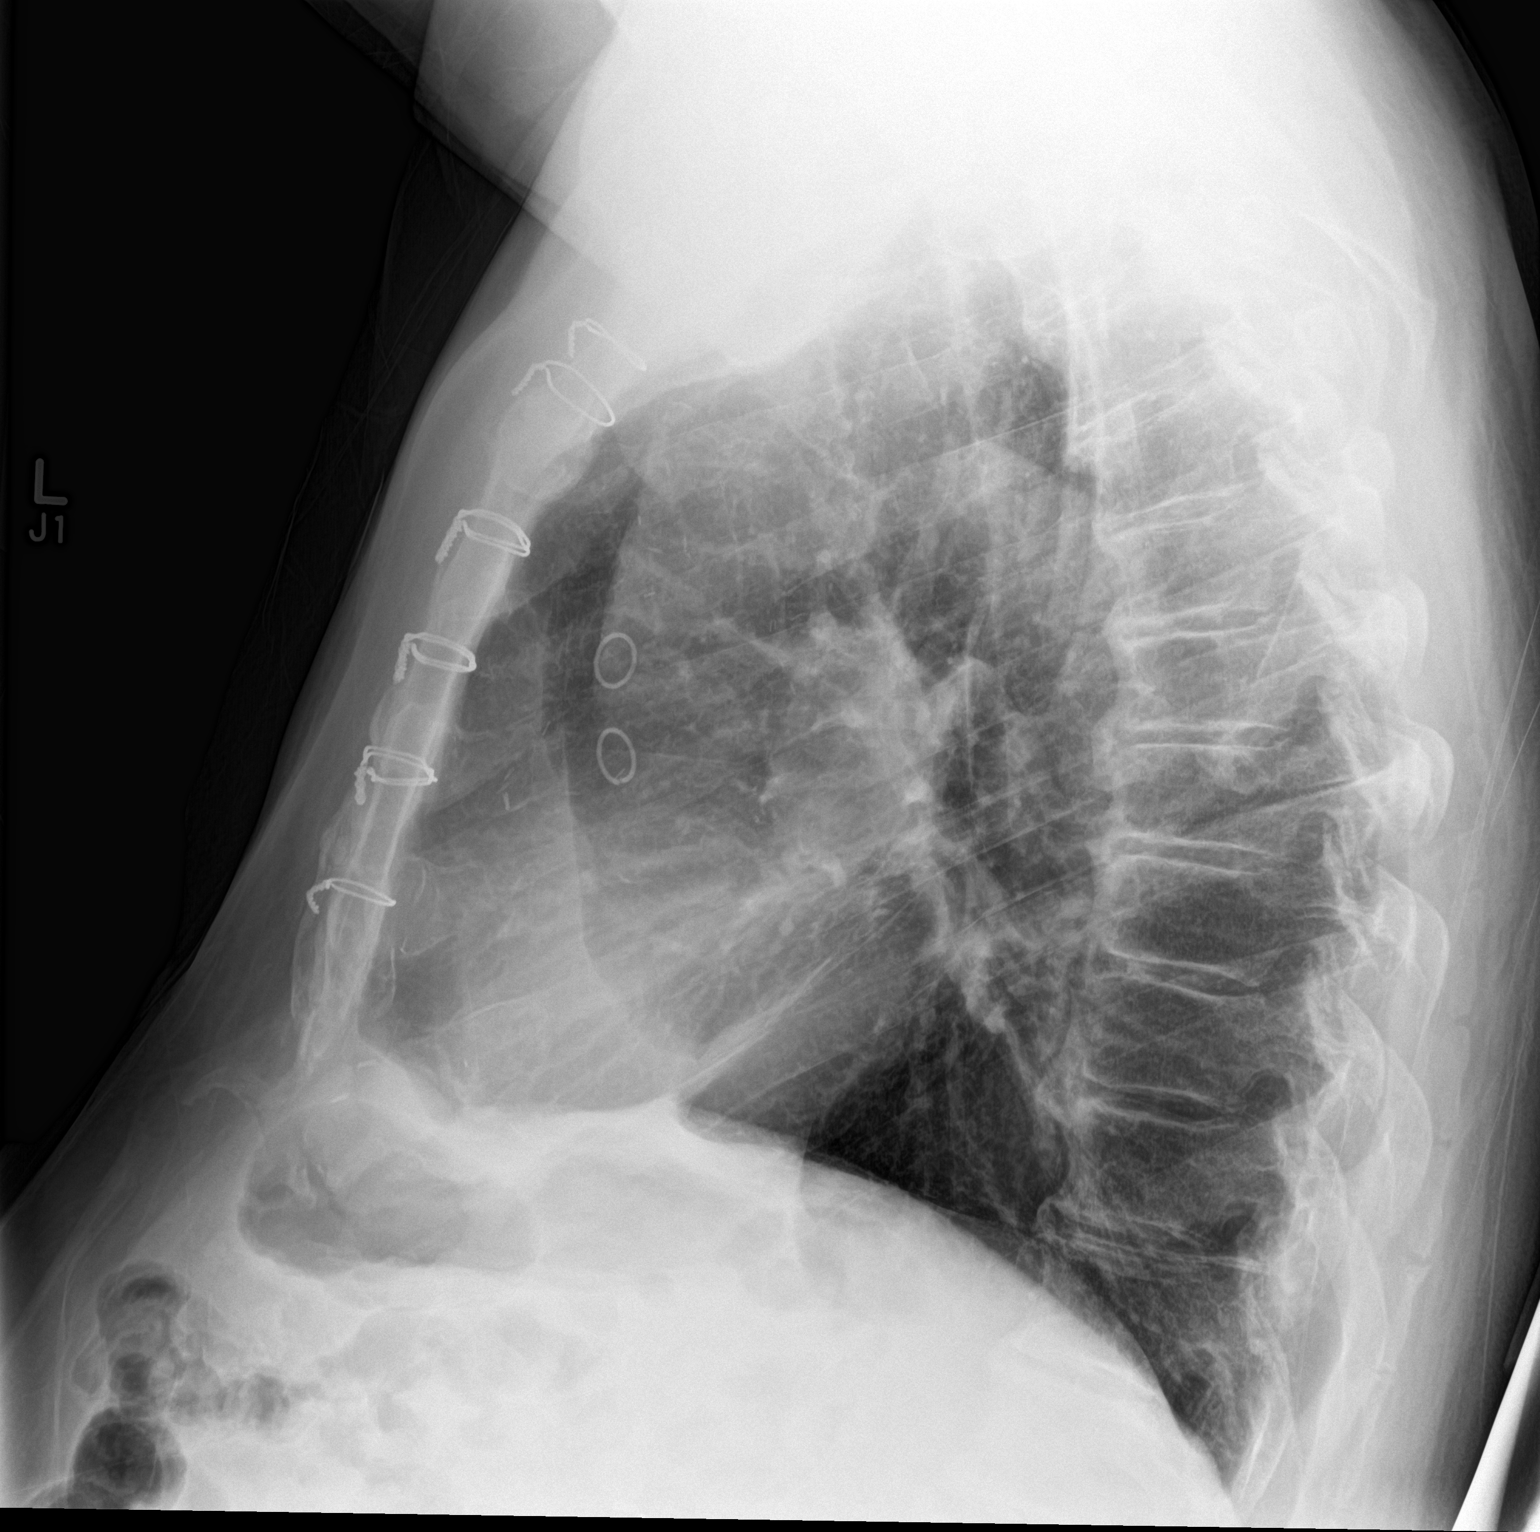

[Series 3: chest ap · 0.14mm/px · 2 of 2 slices shown]
[im 1/2]
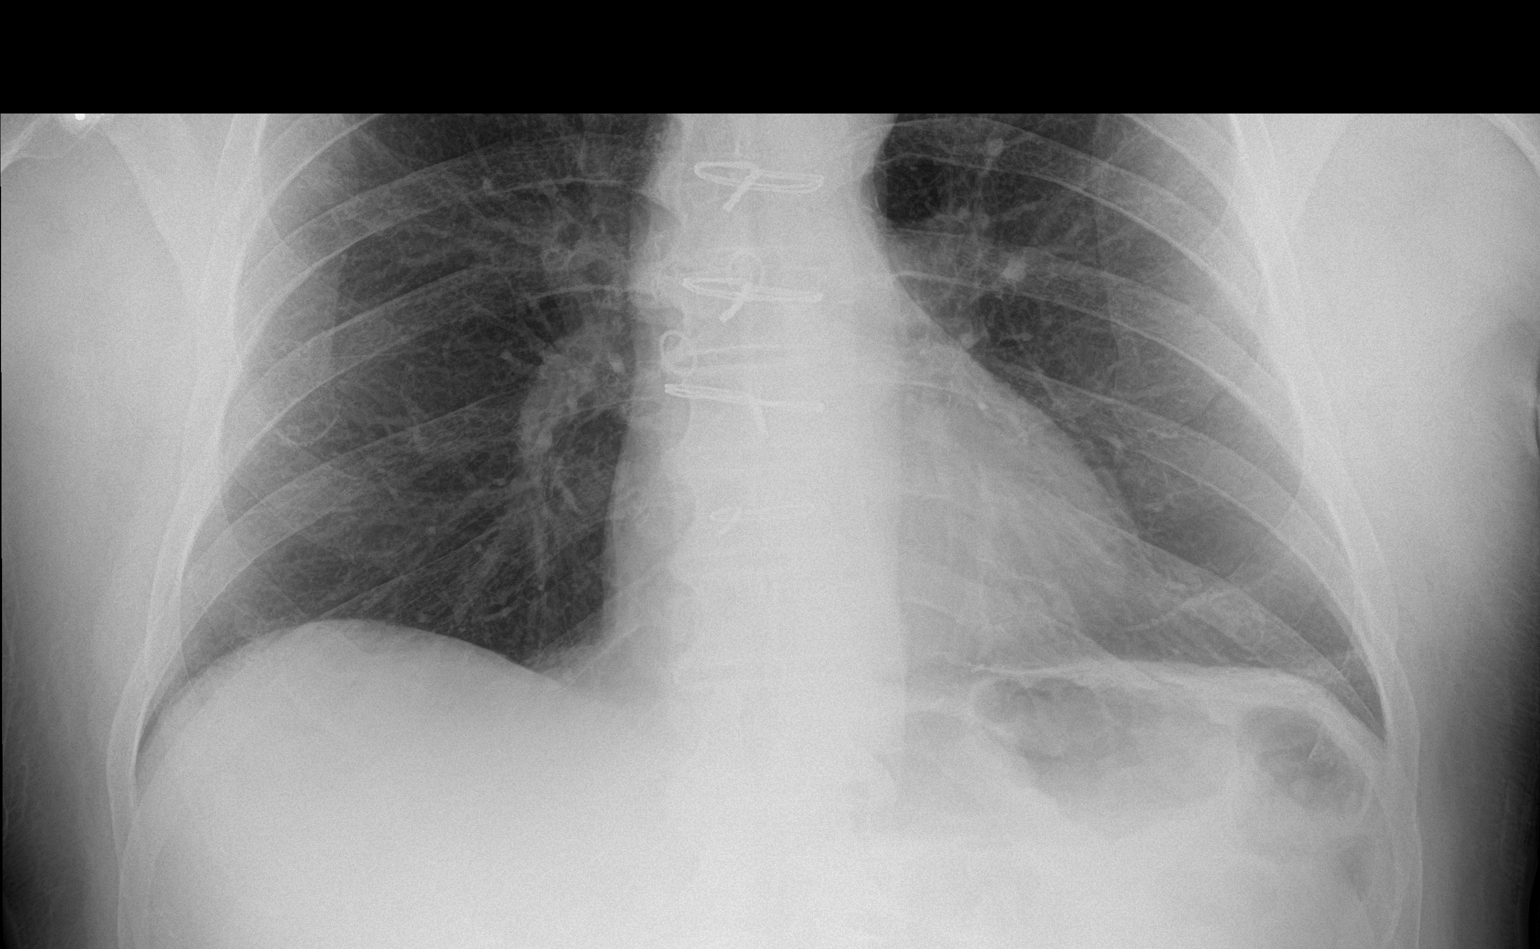
[im 2/2]
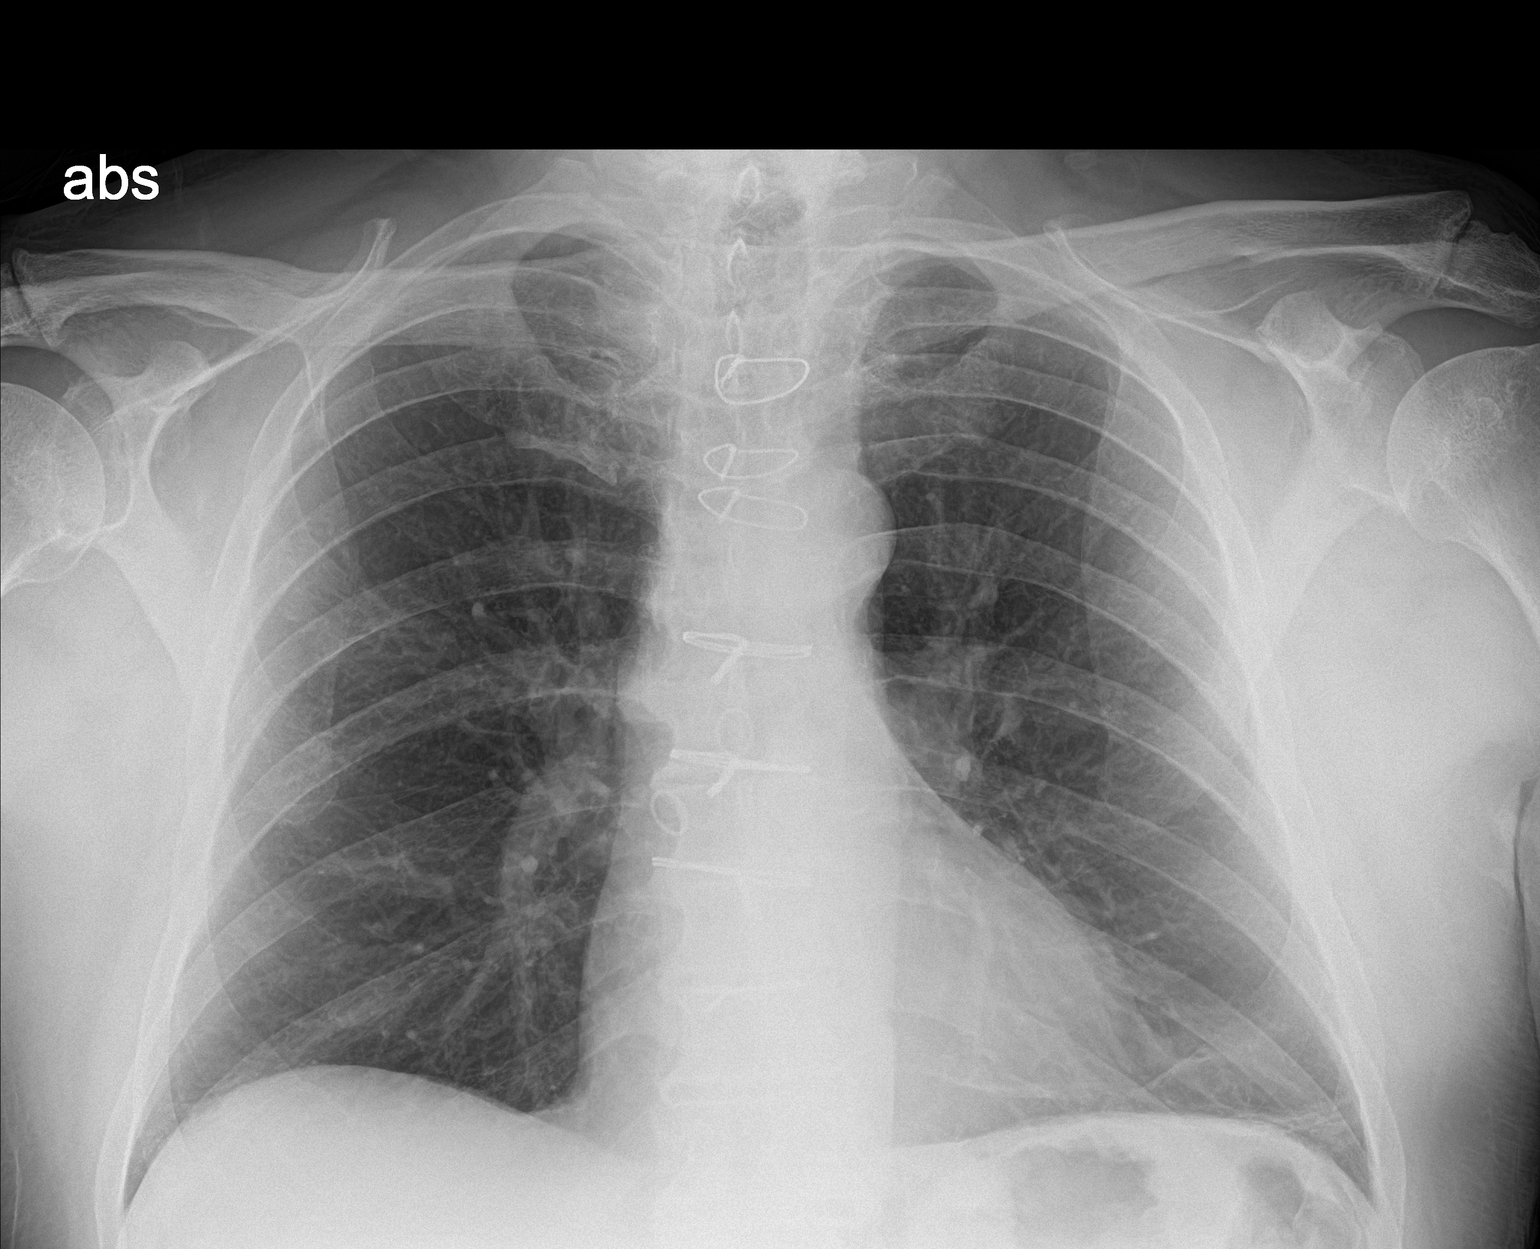

[3 of 3 positions shown; findings below may reference images not displayed]

FINDINGS: Postsurgical changes from CABG are stable.

Cardiomediastinal silhouette is normal. Mediastinal contours appear
intact.

There is no evidence of focal airspace consolidation, pleural
effusion or pneumothorax. Linear opacity in the left lower lobe
likely representing scarring is again seen.

Osseous structures are without acute abnormality. Soft tissues are
grossly normal.
IMPRESSION: No active cardiopulmonary disease.

Stable left lung base linear scarring.

## 2015-08-22 MED ORDER — CO Q-10 100 MG PO CAPS: 100.0000 mg | ORAL_CAPSULE | Freq: Every day | ORAL | Status: DC

## 2015-08-22 MED ORDER — METOPROLOL TARTRATE 25 MG PO TABS
25.0000 mg | ORAL_TABLET | Freq: Two times a day (BID) | ORAL | Status: DC
  Administered 2015-08-22: 25 mg via ORAL
  Filled 2015-08-22 (×2): qty 1

## 2015-08-22 MED ORDER — NITROGLYCERIN 0.4 MG SL SUBL: 0.4000 mg | SUBLINGUAL_TABLET | SUBLINGUAL | Status: DC | PRN

## 2015-08-22 MED ORDER — MORPHINE SULFATE (PF) 2 MG/ML IV SOLN: 2.0000 mg | INTRAVENOUS | Status: DC | PRN

## 2015-08-22 MED ORDER — ATORVASTATIN CALCIUM 20 MG PO TABS
40.0000 mg | ORAL_TABLET | Freq: Every day | ORAL | Status: DC
  Administered 2015-08-22 – 2015-08-23 (×2): 40 mg via ORAL
  Filled 2015-08-22 (×2): qty 2

## 2015-08-22 MED ORDER — ASPIRIN EC 325 MG PO TBEC
325.0000 mg | DELAYED_RELEASE_TABLET | Freq: Every day | ORAL | Status: DC
  Filled 2015-08-22: qty 1

## 2015-08-22 MED ORDER — AMIODARONE HCL 200 MG PO TABS
200.0000 mg | ORAL_TABLET | Freq: Two times a day (BID) | ORAL | Status: DC
  Administered 2015-08-22: 200 mg via ORAL
  Filled 2015-08-22: qty 1

## 2015-08-22 MED ORDER — APIXABAN 5 MG PO TABS
5.0000 mg | ORAL_TABLET | Freq: Two times a day (BID) | ORAL | Status: DC
  Administered 2015-08-22 – 2015-08-23 (×2): 5 mg via ORAL
  Filled 2015-08-22 (×2): qty 1

## 2015-08-22 MED ORDER — TIOTROPIUM BROMIDE MONOHYDRATE 18 MCG IN CAPS
18.0000 ug | ORAL_CAPSULE | Freq: Every day | RESPIRATORY_TRACT | Status: DC
  Administered 2015-08-22 – 2015-08-23 (×2): 18 ug via RESPIRATORY_TRACT
  Filled 2015-08-22: qty 5

## 2015-08-22 MED ORDER — TRAMADOL HCL 50 MG PO TABS
50.0000 mg | ORAL_TABLET | Freq: Four times a day (QID) | ORAL | Status: DC | PRN
  Administered 2015-08-22: 50 mg via ORAL
  Filled 2015-08-22: qty 1

## 2015-08-22 MED ORDER — BUDESONIDE-FORMOTEROL FUMARATE 160-4.5 MCG/ACT IN AERO
2.0000 | INHALATION_SPRAY | Freq: Two times a day (BID) | RESPIRATORY_TRACT | Status: DC | PRN
  Filled 2015-08-22: qty 6

## 2015-08-22 MED ORDER — ADULT MULTIVITAMIN W/MINERALS CH
1.0000 | ORAL_TABLET | Freq: Every day | ORAL | Status: DC
  Administered 2015-08-22 – 2015-08-23 (×2): 1 via ORAL
  Filled 2015-08-22 (×2): qty 1

## 2015-08-22 MED ORDER — AMLODIPINE BESYLATE 5 MG PO TABS
5.0000 mg | ORAL_TABLET | Freq: Every day | ORAL | Status: DC
  Administered 2015-08-22 – 2015-08-23 (×2): 5 mg via ORAL
  Filled 2015-08-22 (×2): qty 1

## 2015-08-22 MED ORDER — BUDESONIDE-FORMOTEROL FUMARATE 160-4.5 MCG/ACT IN AERO
2.0000 | INHALATION_SPRAY | Freq: Two times a day (BID) | RESPIRATORY_TRACT | Status: DC
  Filled 2015-08-22: qty 6

## 2015-08-22 MED ORDER — SODIUM CHLORIDE 0.9% FLUSH
3.0000 mL | Freq: Two times a day (BID) | INTRAVENOUS | Status: DC
  Administered 2015-08-22 – 2015-08-23 (×2): 3 mL via INTRAVENOUS

## 2015-08-22 MED ORDER — ALBUTEROL SULFATE (2.5 MG/3ML) 0.083% IN NEBU: 2.5000 mg | INHALATION_SOLUTION | RESPIRATORY_TRACT | Status: DC | PRN

## 2015-08-22 NOTE — ED Provider Notes (Signed)
[facility] Emergency Department Provider Note   ____________________________________________  Time seen: Approximately 3:15 PM  I have reviewed the triage vital signs and the nursing notes.   HISTORY  Chief Complaint Chest Pain    HPI [name] is a 70 y.o. male patient reports he was driving down the road and all of a sudden he got very weak and dizzy and felt like he would pass out. His vision began to gray out. Patient reports his chest felt full. Patient reports the chest fullness lasted for about a half an hour. He had no nausea but was sweaty all over. he reports chest fullness lasting for about a half an hour. Patient's wife says that patient had no chest pain with his previous MI. Patient denies any perception of weakness or incoordination. Patient has had a CABG 3 BMI A and "systemic heart disease" hypertension and CLL. Patient got chemotherapy last on Friday the 13th and CLL.  Past Medical History  Diagnosis Date  . Coronary artery disease     a. 04/2009 CABG x 3 (LIMA->LAD, VG->OM1, VG->PDA);  b. 09/2009 Caht: occluded VG x 2 w/ patent LIMA and L->R collats. EF 55%, mild antlat HK;  c. 10/2011 MV: EF 53%, no isch/infarct-->low risk.  . Pure hypercholesterolemia   . Hypertension   . Atrial fibrillation ([place])     a. Dx 2013, recurred 02/2014, CHA2DS2VASc = 3 -->placed on Eliquis;  b. 02/2014 Echo: EF 50-55%, mid and apical anterior septum and mid and apical inf septum are abnl, mild to mod Ao sclerosis w/o AS.  Marland Kitchen History of arthritis   . Arthritis   . Chicken pox   . Rheumatic fever   . COPD (chronic obstructive pulmonary disease) ([place])   . Arthritis   . Chronic lymphocytic leukemia ([place])     a. Dx 02/2014.  Marland Kitchen Coronary artery disease     Post CABG 2010, subsequent catheterization 2011 revealed occluded vein grafts X2  . Hyperlipidemia     Patient Active Problem List   Diagnosis Date Noted  . Osteoarthritis of both knees 07/05/2015  . Dog  bite of finger 09/05/2014  . Open fracture of tuft of distal phalanx of finger 09/05/2014  . Chronic lymphatic leukemia.  Stage 2 given leukocytosis, lymphadenopathy and splenomegaly. Sept 2015 - Rapidly progressive bulky lymphadenopathy. Oct 2015 - Positive for ZAP-70 and CD38. 03/16/2014  . Leukocytosis 03/07/2014  . Medicare annual wellness visit, subsequent 07/08/2012  . Screening for colon cancer 01/07/2012  . COPD (chronic obstructive pulmonary disease) ([place]) 01/01/2012  . Shortness of breath 10/09/2011  . Atrial fibrillation 10/09/2011  . HYPERCHOLESTEROLEMIA 11/28/2009  . HYPERTENSION, BENIGN 11/28/2009  . CAD, ARTERY BYPASS GRAFT 11/28/2009  . TOBACCO ABUSE, HX OF 11/28/2009    Past Surgical History  Procedure Laterality Date  . Cardiac catheterization  2011    Dr Fletcher Anon  . Cholecystectomy    . Appendectomy  1988  . Tonsillectomy and adenoidectomy  1956  . Coronary artery bypass graft  2010    3v  . Hernia repair      abdominal  . Hernia repair      Current Outpatient Rx  Name  Route  Sig  Dispense  Refill  . amiodarone (PACERONE) 200 MG tablet   Oral   Take 1 tablet (200 mg total) by mouth 2 (two) times daily as needed.   60 tablet   3   . amLODipine (NORVASC) 10 MG tablet   Oral  Take 0.5 tablets (5 mg total) by mouth daily.   30 tablet   6   . apixaban (ELIQUIS) 5 MG TABS tablet   Oral   Take 1 tablet (5 mg total) by mouth 2 (two) times daily.   60 tablet   6   . atorvastatin (LIPITOR) 80 MG tablet   Oral   Take 0.5 tablets (40 mg total) by mouth daily.   90 tablet   6   . budesonide-formoterol (SYMBICORT) 160-4.5 MCG/ACT inhaler   Inhalation   Inhale 2 puffs into the lungs 2 (two) times daily as needed.   1 Inhaler   6   . Coenzyme Q10 (CO Q-10) 100 MG CAPS   Oral   Take 100 mg by mouth daily.         Marland Kitchen ezetimibe (ZETIA) 10 MG tablet   Oral   Take 1 tablet (10 mg total) by mouth daily.   90 tablet   3   . metoprolol tartrate  (LOPRESSOR) 50 MG tablet   Oral   Take 1 tablet (50 mg total) by mouth 2 (two) times daily.   180 tablet   3   . Multiple Vitamin (MULTI VITAMIN MENS PO)   Oral   Take 1 tablet by mouth daily.         . nitroGLYCERIN (NITROSTAT) 0.4 MG SL tablet   Sublingual   Place 0.4 mg under the tongue every 5 (five) minutes as needed.         . tiotropium (SPIRIVA HANDIHALER) 18 MCG inhalation capsule   Inhalation   Place 1 capsule (18 mcg total) into inhaler and inhale daily.   30 capsule   6   . traMADol (ULTRAM) 50 MG tablet   Oral   Take 1 tablet (50 mg total) by mouth every 6 (six) hours as needed.   90 tablet   0     Allergies Review of patient's allergies indicates no known allergies.  Family History  Problem Relation Age of Onset  . Coronary artery disease      family history  . Heart disease Mother     Social History Social History  Substance Use Topics  . Smoking status: Former Smoker -- 1.00 packs/day for 40 years    Types: Cigarettes    Quit date: 07/21/2006  . Smokeless tobacco: Never Used  . Alcohol Use: 0.5 oz/week    1 Standard drinks or equivalent per week     Comment: occasionally    Review of Systems Constitutional: No fever/chills Eyes: No visual changes. ENT: No sore throat. Cardiovascular: Denies chest pain. Respiratory: Denies shortness of breath. Gastrointestinal: No abdominal pain.  No nausea, no vomiting.  No diarrhea.  No constipation. Genitourinary: Negative for dysuria. Musculoskeletal: Negative for back pain. Skin: Negative for rash. Neurological: Negative for headaches, focal weakness or numbness.  10-point ROS otherwise negative.  ____________________________________________   PHYSICAL EXAM:  VITAL SIGNS: ED Triage Vitals  Enc Vitals Group     BP 08/22/15 1251 149/74 mmHg     Pulse Rate 08/22/15 1251 56     Resp 08/22/15 1251 20     Temp 08/22/15 1251 97.5 F (36.4 C)     Temp Source 08/22/15 1251 Oral     SpO2  08/22/15 1251 98 %     Weight 08/22/15 1251 223 lb (101.152 kg)     Height 08/22/15 1251 5\' 9"  (1.753 m)     Head Cir --  Peak Flow --      Pain Score 08/22/15 1252 4     Pain Loc --      Pain Edu? --      Excl. in [place]? --     Constitutional: Alert and oriented. Patient looks very tired. Eyes: Conjunctivae are normal. PERRL. EOMI. Head: Atraumatic. Nose: No congestion/rhinnorhea. Mouth/Throat: M Cardiovascular: Normal rate, regular rhythm. Grossly normal heart sounds.  Good peripheral circulation. Respiratory: Normal respiratory effort.  No retractions. Lungs CTAB. Gastrointestinal: Soft and nontender. No distention. No abdominal bruits. No CVA tenderness. Musculoskeletal: No lower extremity tenderness nor edema.  No joint effusions. Patient complains of chronic bilateral leg pain. Neurologic:  Normal speech and language. No gross focal neurologic deficits are appreciated. Cranial nerves II through XII are intact cerebellar finger to nose and rapid alternating movements and hands and heel-to-shin are all normal. Motor strength is 5 out of 5 throughout there are no sensory changes I can find. No gait instability. Skin:  Skin is warm, dry and intact. No rash noted. Psychiatric: Mood and affect are normal. Speech and behavior are normal.  ____________________________________________   LABS (all labs ordered are listed, but only abnormal results are displayed)  Labs Reviewed  BASIC METABOLIC PANEL - Abnormal; Notable for the following:    Chloride 112 (*)    Glucose, Bld 113 (*)    Calcium 8.8 (*)    Anion gap 2 (*)    All other components within normal limits  CBC - Abnormal; Notable for the following:    RBC 4.33 (*)    Platelets 132 (*)    All other components within normal limits  TROPONIN I  HEPATIC FUNCTION PANEL  TROPONIN I   ____________________________________________  EKG  EKG read and interpreted by me shows sinus bradycardia rate of 55 normal axis no acute  changes ____________________________________________  RADIOLOGY  Chest x-ray and CT of the head both read by radiology as no acute disease ____________________________________________   PROCEDURES    ____________________________________________   INITIAL IMPRESSION / ASSESSMENT AND PLAN / ED COURSE  Pertinent labs & imaging results that were available during my care of the patient were reviewed by me and considered in my medical decision making (see chart for details).  Patient with near syncope while sitting he looks ill is bradycardic has a known history of heart disease I will admit the patient ____________________________________________   FINAL CLINICAL IMPRESSION(S) / ED DIAGNOSES  Final diagnoses:  Near syncope        Nena Polio, MD 08/22/15 1715

## 2015-08-22 NOTE — ED Notes (Signed)
MD at bedside. 

## 2015-08-22 NOTE — Progress Notes (Signed)
Patient arrived to 2A Room 254. Patient denies pain and all questions answered. Patient oriented to unit and Fall Safety Plan signed. Skin assessment completed with Mathis Fare. Skin intact with old scar along midline chest from previous CABG. Patient A&Ox4, VS: BP 120/69, HR 62, RR 18, SpO2 97% RA, and T 97.2 orally. Nursing staff will continue to monitor. Earleen Reaper, RN

## 2015-08-22 NOTE — ED Notes (Signed)
Pt to ed with c/o chest pain that started about 30 min pta.  Pt reports weakness and sob and pressure in chest.  Hx of CABG

## 2015-08-22 NOTE — ED Notes (Signed)
Pt denies CP, weakness or n/v

## 2015-08-22 NOTE — Progress Notes (Signed)

## 2015-08-22 NOTE — H&P (Signed)
[facility] [place] NAME: [name]    MR#:  [id number]  DATE OF BIRTH:  1945-10-21  DATE OF ADMISSION:  08/22/2015  PRIMARY CARE PHYSICIAN: [name], MD   REQUESTING/REFERRING PHYSICIAN: Malinda  CHIEF COMPLAINT:   Chest heaviness HISTORY OF PRESENT ILLNESS:  [name]  is a 70 y.o. male with a known history of coronary artery disease status post CABG with 3 vessel disease came into the ED with a chief complaint of chest heaviness associated with shortness of breath and diaphoresis this morning. Patient was driving down the road and suddenly he started feeling weak and very dizzy and felt like he is going to pass out. He was feeling heavy in his chest associated with shortness of breath and diaphoresis. It has lasted for approximately 30 minutes and patient came into the ED. Initial troponin is negative and EKG has revealed a sinus bradycardia. Cardiac stress test was done approximately more than one year ago and he thinks it is normal  PAST MEDICAL HISTORY:   Past Medical History  Diagnosis Date  . Coronary artery disease     a. 04/2009 CABG x 3 (LIMA->LAD, VG->OM1, VG->PDA);  b. 09/2009 Caht: occluded VG x 2 w/ patent LIMA and L->R collats. EF 55%, mild antlat HK;  c. 10/2011 MV: EF 53%, no isch/infarct-->low risk.  . Pure hypercholesterolemia   . Hypertension   . Atrial fibrillation ([place])     a. Dx 2013, recurred 02/2014, CHA2DS2VASc = 3 -->placed on Eliquis;  b. 02/2014 Echo: EF 50-55%, mid and apical anterior septum and mid and apical inf septum are abnl, mild to mod Ao sclerosis w/o AS.  Marland Kitchen History of arthritis   . Arthritis   . Chicken pox   . Rheumatic fever   . COPD (chronic obstructive pulmonary disease) ([place])   . Arthritis   . Chronic lymphocytic leukemia ([place])     a. Dx 02/2014.  Marland Kitchen Coronary artery disease     Post CABG 2010, subsequent catheterization 2011 revealed occluded vein grafts X2  .  Hyperlipidemia     PAST SURGICAL HISTOIRY:   Past Surgical History  Procedure Laterality Date  . Cardiac catheterization  2011    Dr Fletcher Anon  . Cholecystectomy    . Appendectomy  1988  . Tonsillectomy and adenoidectomy  1956  . Coronary artery bypass graft  2010    3v  . Hernia repair      abdominal  . Hernia repair      SOCIAL HISTORY:   Social History  Substance Use Topics  . Smoking status: Former Smoker -- 1.00 packs/day for 40 years    Types: Cigarettes    Quit date: 07/21/2006  . Smokeless tobacco: Never Used  . Alcohol Use: 0.5 oz/week    1 Standard drinks or equivalent per week     Comment: occasionally    FAMILY HISTORY:   Family History  Problem Relation Age of Onset  . Coronary artery disease      family history  . Heart disease Mother     DRUG ALLERGIES:  No Known Allergies  REVIEW OF SYSTEMS:  CONSTITUTIONAL: No fever, fatigue or weakness.  EYES: No blurred or double vision.  EARS, NOSE, AND THROAT: No tinnitus or ear pain.  RESPIRATORY: No cough, shortness of breath, wheezing or hemoptysis.  CARDIOVASCULAR: No chest pain, orthopnea, edema.  GASTROINTESTINAL: No nausea, vomiting, diarrhea or abdominal pain.  GENITOURINARY: No dysuria, hematuria.  ENDOCRINE:  No polyuria, nocturia,  HEMATOLOGY: No anemia, easy bruising or bleeding SKIN: No rash or lesion. MUSCULOSKELETAL: No joint pain or arthritis.   NEUROLOGIC: No tingling, numbness, weakness.  PSYCHIATRY: No anxiety or depression.   MEDICATIONS AT HOME:   Prior to Admission medications   Medication Sig Start Date End Date Taking? Authorizing Provider  amiodarone (PACERONE) 200 MG tablet Take 1 tablet (200 mg total) by mouth 2 (two) times daily as needed. 12/20/14   [name], MD  amLODipine (NORVASC) 10 MG tablet Take 0.5 tablets (5 mg total) by mouth daily. 03/20/14   Rogelia Mire, NP  apixaban (ELIQUIS) 5 MG TABS tablet Take 1 tablet (5 mg total) by mouth 2 (two) times daily.  02/12/15   [name], MD  atorvastatin (LIPITOR) 80 MG tablet Take 0.5 tablets (40 mg total) by mouth daily. 03/20/14   Rogelia Mire, NP  budesonide-formoterol [facility]) 160-4.5 MCG/ACT inhaler Inhale 2 puffs into the lungs 2 (two) times daily as needed. 03/20/14   Rogelia Mire, NP  Coenzyme Q10 (CO Q-10) 100 MG CAPS Take 100 mg by mouth daily.    Historical Provider, MD  ezetimibe (ZETIA) 10 MG tablet Take 1 tablet (10 mg total) by mouth daily. 06/20/15   [name], MD  metoprolol tartrate (LOPRESSOR) 50 MG tablet Take 1 tablet (50 mg total) by mouth 2 (two) times daily. 12/20/14   [name], MD  Multiple Vitamin (MULTI VITAMIN MENS PO) Take 1 tablet by mouth daily.    Historical Provider, MD  nitroGLYCERIN (NITROSTAT) 0.4 MG SL tablet Place 0.4 mg under the tongue every 5 (five) minutes as needed.    Historical Provider, MD  tiotropium (SPIRIVA HANDIHALER) 18 MCG inhalation capsule Place 1 capsule (18 mcg total) into inhaler and inhale daily. 03/20/14 06/06/16  Rogelia Mire, NP  traMADol (ULTRAM) 50 MG tablet Take 1 tablet (50 mg total) by mouth every 6 (six) hours as needed. 08/03/15   Cammie Sickle, MD      VITAL SIGNS:  Blood pressure 138/81, pulse 55, temperature 97.5 F (36.4 C), temperature source Oral, resp. rate 20, height 5\' 9"  (1.753 m), weight 101.152 kg (223 lb), SpO2 99 %.  PHYSICAL EXAMINATION:  GENERAL:  70 y.o.-year-old patient lying in the bed with no acute distress.  EYES: Pupils equal, round, reactive to light and accommodation. No scleral icterus. Extraocular muscles intact.  HEENT: Head atraumatic, normocephalic. Oropharynx and nasopharynx clear.  NECK:  Supple, no jugular venous distention. No thyroid enlargement, no tenderness.  LUNGS: Normal breath sounds bilaterally, no wheezing, rales,rhonchi or crepitation. No use of accessory muscles of respiration.  CARDIOVASCULAR: S1, S2 normal. No murmurs, rubs, or gallops.  ABDOMEN:  Soft, nontender, nondistended. Bowel sounds present. No organomegaly or mass.  EXTREMITIES: No pedal edema, cyanosis, or clubbing.  NEUROLOGIC: Cranial nerves II through XII are intact. Muscle strength 5/5 in all extremities. Sensation intact. Gait not checked.  PSYCHIATRIC: The patient is alert and oriented x 3.  SKIN: No obvious rash, lesion, or ulcer.   LABORATORY PANEL:   CBC  Recent Labs Lab 08/22/15 1258  WBC 6.8  HGB 13.8  HCT 40.5  PLT 132*   ------------------------------------------------------------------------------------------------------------------  Chemistries   Recent Labs Lab 08/22/15 1258  NA 141  K 4.1  CL 112*  CO2 27  GLUCOSE 113*  BUN 11  CREATININE 0.93  CALCIUM 8.8*  AST 23  ALT 26  ALKPHOS 76  BILITOT 0.8   ------------------------------------------------------------------------------------------------------------------  Cardiac Enzymes  Recent Labs Lab 08/22/15 1653  TROPONINI <0.03   ------------------------------------------------------------------------------------------------------------------  RADIOLOGY:  Dg Chest 2 View  08/22/2015  CLINICAL DATA:  Increasing weakness, chest pain and shortness breath. EXAM: CHEST  2 VIEW COMPARISON:  07/30/2015 FINDINGS: Postsurgical changes from CABG are stable. Cardiomediastinal silhouette is normal. Mediastinal contours appear intact. There is no evidence of focal airspace consolidation, pleural effusion or pneumothorax. Linear opacity in the left lower lobe likely representing scarring is again seen. Osseous structures are without acute abnormality. Soft tissues are grossly normal. IMPRESSION: No active cardiopulmonary disease. Stable left lung base linear scarring. Electronically Signed   By: Fidela Salisbury M.D.   On: 08/22/2015 13:29   Ct Head Wo Contrast  08/22/2015  CLINICAL DATA:  Generalized weakness, near syncope. EXAM: CT HEAD WITHOUT CONTRAST TECHNIQUE: Contiguous axial images were  obtained from the base of the skull through the vertex without intravenous contrast. COMPARISON:  None. FINDINGS: Bony calvarium appears intact. Mucous retention cysts are noted in the maxillary sinuses bilaterally. Minimal diffuse cortical atrophy is noted. Mild chronic ischemic white matter disease is noted. No mass effect or midline shift is noted. Ventricular size is within normal limits. There is no evidence of mass lesion, hemorrhage or acute infarction. IMPRESSION: Minimal diffuse cortical atrophy. Mild chronic ischemic white matter disease. No acute intracranial abnormality seen. Electronically Signed   By: Marijo Conception, M.D.   On: 08/22/2015 15:43    EKG:   Orders placed or performed during the hospital encounter of 08/22/15  . EKG 12-Lead  . EKG 12-Lead  . ED EKG within 10 minutes  . ED EKG within 10 minutes    IMPRESSION AND PLAN:   1. Near syncope with chest heaviness probably cardiac etiology Admit patient to telemetry 12-lead EKG with sinus bradycardia with heart rate around 50-55 Cycle cardiac biomarkers and repeat EKG NPO after midnight for possible stress test in a.m. Cardiac consult was placed in Dr. Rockey Situ Provide aspirin,  and statin Give nitroglycerin as needed Reduce beta blocker for bradycardia  2. Chest heaviness with history of coronary artery disease status post CABG-3 vessel bypass grafting Cycle cardiac biomarkers Myoview test in a.m. Provide aspirin and statin. Oxygen via nasal cannula  3. History of chronic lymphocytic leukemia Continue chemotherapy as recommended by oncology and follow-up with Dr. Burlene Arnt  4. Chronic history of COPD with no exacerbation Provide albuterol nebulizer treatments as needed Continue home meds  5. Chronic history of atrial fibrillation currently rate controlled Continue home medication and requests, amiodarone and reduce metoprolol dose in view of sinus bradycardia. Asa      All the records are reviewed and case  discussed with ED provider. Management plans discussed with the patient, family and they are in agreement.  CODE STATUS: Full code, wife is the healthcare power of attorney  TOTAL TIME TAKING CARE OF THIS PATIENT: 45 minutes.    Nicholes Mango M.D on 08/22/2015 at 5:43 PM  Between 7am to 6pm - Pager - [phone]  After 6pm go to [email or web address] - password [email or web address] [facility]  Office  [phone]  CC: Primary care physician; [name], MD

## 2015-08-23 ENCOUNTER — Other Ambulatory Visit: Payer: Self-pay

## 2015-08-23 ENCOUNTER — Other Ambulatory Visit: Payer: Self-pay | Admitting: Cardiovascular Disease

## 2015-08-23 ENCOUNTER — Observation Stay (HOSPITAL_BASED_OUTPATIENT_CLINIC_OR_DEPARTMENT_OTHER): Payer: Medicare HMO

## 2015-08-23 ENCOUNTER — Telehealth: Payer: Self-pay | Admitting: *Deleted

## 2015-08-23 ENCOUNTER — Observation Stay (HOSPITAL_BASED_OUTPATIENT_CLINIC_OR_DEPARTMENT_OTHER)
Admit: 2015-08-23 | Discharge: 2015-08-23 | Disposition: A | Discharge status: Home / Self Care | Payer: Medicare HMO | Attending: Student | Admitting: Student

## 2015-08-23 DIAGNOSIS — R001 Bradycardia, unspecified: Secondary | ICD-10-CM | POA: Diagnosis not present

## 2015-08-23 DIAGNOSIS — C9110 Chronic lymphocytic leukemia of B-cell type not having achieved remission: Secondary | ICD-10-CM | POA: Diagnosis not present

## 2015-08-23 DIAGNOSIS — I480 Paroxysmal atrial fibrillation: Secondary | ICD-10-CM

## 2015-08-23 DIAGNOSIS — F4310 Post-traumatic stress disorder, unspecified: Secondary | ICD-10-CM | POA: Insufficient documentation

## 2015-08-23 DIAGNOSIS — R55 Syncope and collapse: Secondary | ICD-10-CM | POA: Diagnosis not present

## 2015-08-23 DIAGNOSIS — I25701 Atherosclerosis of coronary artery bypass graft(s), unspecified, with angina pectoris with documented spasm: Secondary | ICD-10-CM

## 2015-08-23 DIAGNOSIS — J449 Chronic obstructive pulmonary disease, unspecified: Secondary | ICD-10-CM | POA: Diagnosis not present

## 2015-08-23 DIAGNOSIS — I25810 Atherosclerosis of coronary artery bypass graft(s) without angina pectoris: Secondary | ICD-10-CM | POA: Diagnosis not present

## 2015-08-23 LAB — URINALYSIS COMPLETE WITH MICROSCOPIC (ARMC ONLY)
Bacteria, UA: NONE SEEN
Bilirubin Urine: NEGATIVE
GLUCOSE, UA: NEGATIVE mg/dL
KETONES UR: NEGATIVE mg/dL
Leukocytes, UA: NEGATIVE
NITRITE: NEGATIVE
Protein, ur: NEGATIVE mg/dL
SPECIFIC GRAVITY, URINE: 1.014 (ref 1.005–1.030)
Squamous Epithelial / LPF: NONE SEEN
pH: 6 (ref 5.0–8.0)

## 2015-08-23 LAB — LIPID PANEL
Cholesterol: 142 mg/dL (ref 0–200)
HDL: 33 mg/dL — AB (ref >40)
LDL CALC: 76 mg/dL (ref 0–99)
TRIGLYCERIDES: 167 mg/dL — AB (ref <150)
Total CHOL/HDL Ratio: 4.3 RATIO
VLDL: 33 mg/dL (ref 0–40)

## 2015-08-23 LAB — NM MYOCAR MULTI W/SPECT W/WALL MOTION / EF
CHL CUP NUCLEAR SDS: 2
CHL CUP NUCLEAR SRS: 8
CHL CUP NUCLEAR SSS: 1
CSEPED: 0 min
CSEPHR: 52 %
Estimated workload: 1 METS
Exercise duration (sec): 0 s
LV dias vol: 121 mL
LV sys vol: 31 mL
MPHR: 150 {beats}/min
Peak HR: 79 {beats}/min
Rest HR: 53 {beats}/min
TID: 0.93

## 2015-08-23 MED ORDER — AMIODARONE HCL 200 MG PO TABS: 200.0000 mg | ORAL_TABLET | Freq: Two times a day (BID) | ORAL | Status: DC | PRN

## 2015-08-23 MED ORDER — METOPROLOL TARTRATE 25 MG PO TABS: 12.5000 mg | ORAL_TABLET | Freq: Two times a day (BID) | ORAL | Status: DC

## 2015-08-23 MED ORDER — TECHNETIUM TC 99M SESTAMIBI - CARDIOLITE
14.0910 | Freq: Once | INTRAVENOUS | Status: AC | PRN
  Administered 2015-08-23: 09:00:00 14.091 via INTRAVENOUS

## 2015-08-23 MED ORDER — TECHNETIUM TC 99M SESTAMIBI - CARDIOLITE
32.5500 | Freq: Once | INTRAVENOUS | Status: AC | PRN
  Administered 2015-08-23: 32.55 via INTRAVENOUS

## 2015-08-23 MED ORDER — REGADENOSON 0.4 MG/5ML IV SOLN
0.4000 mg | Freq: Once | INTRAVENOUS | Status: AC
  Administered 2015-08-23: 0.4 mg via INTRAVENOUS
  Filled 2015-08-23: qty 5

## 2015-08-23 NOTE — Progress Notes (Signed)
Per Dr. Tressia Miners, when patient gets back from stress test let him eat then check another set of orthostatics. Also send down a urinalysis.

## 2015-08-23 NOTE — Care Management Obs Status (Signed)
[facility] NOTIFICATION   Patient Details  Name: ALLANTE MOUSEL MRN: [id number] Date of Birth: [birth year]   Medicare Observation Status Notification Given:  Yes    Beau Fanny, RN 08/23/2015, 12:39 PM

## 2015-08-23 NOTE — Progress Notes (Signed)
*  PRELIMINARY RESULTS* Echocardiogram 2D Echocardiogram has been performed.  [name] 08/23/2015, 12:15 PM

## 2015-08-23 NOTE — Progress Notes (Signed)
Patient given discharge teaching and paperwork regarding medications, diet, follow-up appointments and activity. Patient understanding verbalized. No complaints at this time. IV and telemetry discontinued prior to leaving. Skin assessment as previously charted and vitals are stable; on room air. Patient being discharged to home. Caregiver/family present during discharge teaching. Metoprolol prescription given to patient; amiodarone e-prescribed to [facility].

## 2015-08-23 NOTE — Progress Notes (Signed)
Dr. Rockey Situ notified of pause on telemetry this morning: 2.24 seconds

## 2015-08-23 NOTE — Telephone Encounter (Signed)
Please advise for a TCM call. thanks

## 2015-08-23 NOTE — Discharge Summary (Signed)
[facility] [place] [facility] NAME: [name]    MR#:  [id number]  DATE OF BIRTH:  1946-07-01  DATE OF ADMISSION:  08/22/2015 ADMITTING PHYSICIAN: Nicholes Mango, MD  DATE OF DISCHARGE: 08/23/2015  3:30 PM  PRIMARY CARE PHYSICIAN: [name], MD    ADMISSION DIAGNOSIS:  Near syncope [R55]  DISCHARGE DIAGNOSIS:  Active Problems:   CAD, ARTERY BYPASS GRAFT   Chest pain   Pre-syncope   Near syncope   CLL (chronic lymphocytic leukemia) (HCC)   Bradycardia   PTSD (post-traumatic stress disorder)   SECONDARY DIAGNOSIS:   Past Medical History  Diagnosis Date  . Coronary artery disease     a. 04/2009 CABG x 3 (LIMA->LAD, VG->OM1, VG->PDA);  b. 09/2009 Cath: occluded VG x 2 w/ patent LIMA and L->R collats. EF 55%, mild antlat HK;  c. 10/2011 MV: EF 53%, no isch/infarct-->low risk.  . Pure hypercholesterolemia   . Hypertension   . Atrial fibrillation ([place])     a. Dx 2013, recurred 02/2014, CHA2DS2VASc = 3 -->placed on Eliquis;  b. 02/2014 Echo: EF 50-55%, mid and apical anterior septum and mid and apical inf septum are abnl, mild to mod Ao sclerosis w/o AS.  Marland Kitchen History of arthritis   . Arthritis   . Chicken pox   . Rheumatic fever   . COPD (chronic obstructive pulmonary disease) ([place])   . Arthritis   . Chronic lymphocytic leukemia ([place])     a. Dx 02/2014.  Marland Kitchen OSA (obstructive sleep apnea)     a. On CPAP    HOSPITAL COURSE:   70 year old male with past medical history significant for coronary artery disease status post CABG, atrial fibrillation on eliquis, hypertension, arthritis presents to the hospital secondary to weakness and lightheadedness episode.  #1 weakness with presyncope-likely cardiogenic in nature. -Patient was admitted to telemetry, his troponins have remained negative. -Orthostatic blood pressure changes were within normal limits. Urine analysis negative for any infection. -Seen by cardiologist, had a cardiac  stress test which did not show any acute ischemic changes. -Was noted to be bradycardic here in the hospital with a 2.2 second pauses once. -Possible thought this patient has may be underlying sick sinus syndrome, 30 day cardiac monitor is being set up. -Metoprolol dose is being reduced and patient will follow up with cardiology as an outpatient.\ -We will continue to take his amiodarone twice a day.  #2 atrial fibrillation-will be monitored on 30 day cardiac monitor. Due to bradycardia metoprolol dose is reduced. -On amiodarone. Also on eliquis for anticoagulation.  #3 hypertension-continue home medications. Patient on metoprolol and Norvasc  #4 CAD status post CABG-stable. Stress test is negative. Continue cardiac medications.  #5 COPD-on inhalers. Stable and no wheezing.  Patient has ambulated without any difficulty. Very eager to go home and is being discharged home with outpatient follow-up  DISCHARGE CONDITIONS:   Stable  CONSULTS OBTAINED:  Treatment Team:  [name], MD  DRUG ALLERGIES:  No Known Allergies  DISCHARGE MEDICATIONS:   Discharge Medication List as of 08/23/2015  2:36 PM    CONTINUE these medications which have CHANGED   Details  amiodarone (PACERONE) 200 MG tablet Take 1 tablet (200 mg total) by mouth 2 (two) times daily as needed (for A-fib)., Starting 08/23/2015, Until Discontinued, Normal    metoprolol (LOPRESSOR) 25 MG tablet Take 0.5 tablets (12.5 mg total) by mouth 2 (two) times daily., Starting 08/23/2015, Until Discontinued, Print  CONTINUE these medications which have NOT CHANGED   Details  amLODipine (NORVASC) 5 MG tablet Take 5 mg by mouth daily., Until Discontinued, Historical Med    apixaban (ELIQUIS) 5 MG TABS tablet Take 1 tablet (5 mg total) by mouth 2 (two) times daily., Starting 02/12/2015, Until Discontinued, Normal    atorvastatin (LIPITOR) 40 MG tablet Take 40 mg by mouth at bedtime., Until Discontinued, Historical Med     budesonide-formoterol (SYMBICORT) 160-4.5 MCG/ACT inhaler Inhale 2 puffs into the lungs 2 (two) times daily., Until Discontinued, Historical Med    Coenzyme Q10 (CO Q-10) 100 MG CAPS Take 100 mg by mouth daily., Until Discontinued, Historical Med    Multiple Vitamin (MULTIVITAMIN WITH MINERALS) TABS tablet Take 1 tablet by mouth daily., Until Discontinued, Historical Med    nitroGLYCERIN (NITROSTAT) 0.4 MG SL tablet Place 0.4 mg under the tongue every 5 (five) minutes as needed for chest pain. , Until Discontinued, Historical Med    tiotropium (SPIRIVA) 18 MCG inhalation capsule Place 18 mcg into inhaler and inhale at bedtime., Until Discontinued, Historical Med    traMADol (ULTRAM) 50 MG tablet Take 1 tablet (50 mg total) by mouth every 6 (six) hours as needed., Starting 08/03/2015, Until Discontinued, Print         DISCHARGE INSTRUCTIONS:   1. 30 day cardiac monitor 2. Cardiology f/u in 2 weeks 3. PCP f/u in 1-2 weeks  If you experience worsening of your admission symptoms, develop shortness of breath, life threatening emergency, suicidal or homicidal thoughts you must seek medical attention immediately by calling 911 or calling your MD immediately  if symptoms less severe.  You Must read complete instructions/literature along with all the possible adverse reactions/side effects for all the Medicines you take and that have been prescribed to you. Take any new Medicines after you have completely understood and accept all the possible adverse reactions/side effects.   Please note  You were cared for by a hospitalist during your hospital stay. If you have any questions about your discharge medications or the care you received while you were in the hospital after you are discharged, you can call the unit and asked to speak with the hospitalist on call if the hospitalist that took care of you is not available. Once you are discharged, your primary care physician will handle any further  medical issues. Please note that NO REFILLS for any discharge medications will be authorized once you are discharged, as it is imperative that you return to your primary care physician (or establish a relationship with a primary care physician if you do not have one) for your aftercare needs so that they can reassess your need for medications and monitor your lab values.    Today   CHIEF COMPLAINT:   Chief Complaint  Patient presents with  . Chest Pain    VITAL SIGNS:  Blood pressure 149/67, pulse 63, temperature 97.7 F (36.5 C), temperature source Oral, resp. rate 18, height 5\' 9"  (1.753 m), weight 101.152 kg (223 lb), SpO2 97 %.  I/O:   Intake/Output Summary (Last 24 hours) at 08/23/15 1742 Last data filed at 08/23/15 1300  Gross per 24 hour  Intake      0 ml  Output    400 ml  Net   -400 ml    PHYSICAL EXAMINATION:   Physical Exam  GENERAL:  70 y.o.-year-old patient lying in the bed with no acute distress.  EYES: Pupils equal, round, reactive to light and accommodation. No scleral  icterus. Extraocular muscles intact.  HEENT: Head atraumatic, normocephalic. Oropharynx and nasopharynx clear.  NECK:  Supple, no jugular venous distention. No thyroid enlargement, no tenderness.  LUNGS: Normal breath sounds bilaterally, no wheezing, rales,rhonchi or crepitation. No use of accessory muscles of respiration.  CARDIOVASCULAR: S1, S2 normal. No  rubs, or gallops. 3/6 systolic murmur present ABDOMEN: Soft, non-tender, non-distended. Bowel sounds present. No organomegaly or mass.  EXTREMITIES: No pedal edema, cyanosis, or clubbing.  NEUROLOGIC: Cranial nerves II through XII are intact. Muscle strength 5/5 in all extremities. Sensation intact. Gait not checked.  PSYCHIATRIC: The patient is alert and oriented x 3.  SKIN: No obvious rash, lesion, or ulcer.   DATA REVIEW:   CBC  Recent Labs Lab 08/22/15 1258  WBC 6.8  HGB 13.8  HCT 40.5  PLT 132*    Chemistries   Recent  Labs Lab 08/22/15 1258  NA 141  K 4.1  CL 112*  CO2 27  GLUCOSE 113*  BUN 11  CREATININE 0.93  CALCIUM 8.8*  AST 23  ALT 26  ALKPHOS 76  BILITOT 0.8    Cardiac Enzymes  Recent Labs Lab 08/22/15 1653  TROPONINI <0.03    Microbiology Results  Results for orders placed or performed in visit on 01/16/12  Fecal occult blood, imunochemical     Status: None   Collection Time: 01/16/12  1:29 PM  Result Value Ref Range Status   Fecal Occult Bld Negative Negative Final    RADIOLOGY:  Dg Chest 2 View  08/22/2015  CLINICAL DATA:  Increasing weakness, chest pain and shortness breath. EXAM: CHEST  2 VIEW COMPARISON:  07/30/2015 FINDINGS: Postsurgical changes from CABG are stable. Cardiomediastinal silhouette is normal. Mediastinal contours appear intact. There is no evidence of focal airspace consolidation, pleural effusion or pneumothorax. Linear opacity in the left lower lobe likely representing scarring is again seen. Osseous structures are without acute abnormality. Soft tissues are grossly normal. IMPRESSION: No active cardiopulmonary disease. Stable left lung base linear scarring. Electronically Signed   By: Fidela Salisbury M.D.   On: 08/22/2015 13:29   Ct Head Wo Contrast  08/22/2015  CLINICAL DATA:  Generalized weakness, near syncope. EXAM: CT HEAD WITHOUT CONTRAST TECHNIQUE: Contiguous axial images were obtained from the base of the skull through the vertex without intravenous contrast. COMPARISON:  None. FINDINGS: Bony calvarium appears intact. Mucous retention cysts are noted in the maxillary sinuses bilaterally. Minimal diffuse cortical atrophy is noted. Mild chronic ischemic white matter disease is noted. No mass effect or midline shift is noted. Ventricular size is within normal limits. There is no evidence of mass lesion, hemorrhage or acute infarction. IMPRESSION: Minimal diffuse cortical atrophy. Mild chronic ischemic white matter disease. No acute intracranial abnormality  seen. Electronically Signed   By: Marijo Conception, M.D.   On: 08/22/2015 15:43   Nm Myocar Multi W/spect W/wall Motion / Ef  08/23/2015   There was no ST segment deviation noted during stress.  T wave inversion was noted during stress in the V1 and V2 leads, beginning at 0 minutes of stress. T wave inversion persisted.  Pharmacological myocardial perfusion imaging study with no significant  Ischemia Attenuation corrected images with region of fixed  thinning noted in the distal anterior and apical region consistent with old scar. This perfusion abnormality was not seen on non-attenuation corrected images. Normal wall motion, EF estimated at 50% No EKG changes concerning for ischemia at peak stress or in recovery. Low risk scan Signed, Esmond Plants, MD,  Ph.D [facility]    EKG:   Orders placed or performed during the hospital encounter of 08/22/15  . EKG 12-Lead  . EKG 12-Lead  . ED EKG within 10 minutes  . ED EKG within 10 minutes      Management plans discussed with the patient, family and they are in agreement.  CODE STATUS:     Code Status Orders        Start     Ordered   08/22/15 1944  Full code   Continuous     08/22/15 1943    Code Status History    Date Active Date Inactive Code Status Order ID Comments User Context   This patient has a current code status but no historical code status.      TOTAL TIME TAKING CARE OF THIS PATIENT: 38 minutes.    Gladstone Lighter M.D on 08/23/2015 at 5:42 PM  Between 7am to 6pm - Pager - [phone]  After 6pm go to [email or web address] - password [email or web address] [facility]  Office  [phone]  CC: Primary care physician; [name], MD

## 2015-08-23 NOTE — Telephone Encounter (Signed)
Patient will be discharged from [facility] on 08/23/15. He was admitted for Chest Pain. HFU scheduled

## 2015-08-23 NOTE — Progress Notes (Signed)
Stress test result  no significant ischemia, normal ejection fraction  Echocardiogram showing normal ejection fraction greater than 55%, no wall motion abnormality  Results discussed with patient, Monitor showing 2.25 second pause, he was asymptomatic He has ambulated without symptoms   etiology of his lightheadedness still unclear, unable to exclude TIA though CT scan was benign, also he is on anticoagulation.  30 day monitor has been ordered, this will be delivered to his house

## 2015-08-23 NOTE — Consult Note (Signed)
Cardiology Consult    Patient ID: [name] MRN: [id number], DOB/AGE: 25-Oct-1945   Admit date: 08/22/2015 Date of Consult: 08/23/2015  Primary Physician: [name], MD Reason for Consult: Chest Pain, Pre-Syncope Primary Cardiologist: Dr. Rockey Situ Requesting Provider: Dr. Margaretmary Eddy  Patient Profile    70 yo male w/ PMH of CAD (s/p CABG x3 in 2010 w/ LIMA-LAD, SVG-OM, SVG-PDA, occluded SVG x 2 by cath in 09/2009 with patent LIMA-LAD), PAF (on Eliquis), HTN, HLD, COPD, OSA and PTSD who presented to [facility] on 08/22/2015 with pre-syncope and chest fullness.   History of Present Illness    [name] is a 70 y.o. male with past medical history of CAD (s/p CABG x3 in 2010 w/ LIMA-LAD, SVG-OM, SVG-PDA, occluded SVG x 2 by cath in 09/2009 with patent LIMA-LAD), PAF (on Eliquis), HLD, COPD, OSA and PTSD who presented to [facility] on 08/22/2015 with pre-syncope and chest fullness.   The patient reports he was driving down the road yesterday when he began to feel dizzy and have blurred vision. He pulled over on the side of the road due to feeling like he was going to pass out. While this was occurring, he also developed a "fullness" sensation in his chest and became diaphoretic. He denies any actual syncope.  He checked his pulse at that time with his arm band and says it was "58". He called his wife who then brought him to the ED for evaluation.   Upon arrival to the ED, his HR was in the high-40's to mid-50's. His chest "fullness" lasted approximately 30 minutes from its initial onset until resolving completely. While in the ED and overnight after being admitted, he denies any repeat symptoms of chest discomfort, dizziness, vision changes, or nausea. He denies having any slurred speech or weakness of his extremities yesterday when the pre-syncopal event occurred. In regards to his atrial fibrillation, he is usually very aware when he goes in and out of the rhythm. He has a history of  post-conversion pauses but denies any recent palpitations or dyspnea.   EKG showed Sinus bradycardia, HR 55, occasional PAC's, minimal TWI in V1 and V2 with no acute ST changes noted. CXR showed no active cardiopulmonary disease. CT Head showed minimal diffuse cortical atrophy with mild chronic ischemic white matter disease and no acute abnormalities. CBC showed normal WBC and Hgb. Platelets chronically low at 132. Initial glucose 113. Creatinine stable at 0.93. No electrolyte abnormalities noted. His initial troponin was negative and cyclic troponin values have been negative as well.   Past Medical History   Past Medical History  Diagnosis Date  . Coronary artery disease     a. 04/2009 CABG x 3 (LIMA->LAD, VG->OM1, VG->PDA);  b. 09/2009 Caht: occluded VG x 2 w/ patent LIMA and L->R collats. EF 55%, mild antlat HK;  c. 10/2011 MV: EF 53%, no isch/infarct-->low risk.  . Pure hypercholesterolemia   . Hypertension   . Atrial fibrillation ([place])     a. Dx 2013, recurred 02/2014, CHA2DS2VASc = 3 -->placed on Eliquis;  b. 02/2014 Echo: EF 50-55%, mid and apical anterior septum and mid and apical inf septum are abnl, mild to mod Ao sclerosis w/o AS.  Marland Kitchen History of arthritis   . Arthritis   . Chicken pox   . Rheumatic fever   . COPD (chronic obstructive pulmonary disease) ([place])   . Arthritis   . Chronic lymphocytic leukemia ([place])     a. Dx 02/2014.  Marland Kitchen Coronary artery  disease     Post CABG 2010, subsequent catheterization 2011 revealed occluded vein grafts X2  . Hyperlipidemia     Past Surgical History  Procedure Laterality Date  . Cardiac catheterization  2011    Dr Fletcher Anon  . Cholecystectomy    . Appendectomy  1988  . Tonsillectomy and adenoidectomy  1956  . Coronary artery bypass graft  2010    3v  . Hernia repair      abdominal  . Hernia repair       Allergies  No Known Allergies  Inpatient Medications    . amLODipine  5 mg Oral Daily  . apixaban  5 mg Oral BID  . atorvastatin  40  mg Oral Daily  . budesonide-formoterol  2 puff Inhalation BID  . metoprolol tartrate  25 mg Oral BID  . multivitamin with minerals  1 tablet Oral Daily  . sodium chloride flush  3 mL Intravenous Q12H  . tiotropium  18 mcg Inhalation Daily    Family History    Family History  Problem Relation Age of Onset  . Coronary artery disease      family history  . Heart disease Mother     Social History    Social History   Social History  . Marital Status: Married    Spouse Name: N/A  . Number of Children: N/A  . Years of Education: N/A   Occupational History  . retired     Research officer, trade union   Social History Main Topics  . Smoking status: Former Smoker -- 1.00 packs/day for 40 years    Types: Cigarettes    Quit date: 07/21/2006  . Smokeless tobacco: Never Used  . Alcohol Use: 0.5 oz/week    1 Standard drinks or equivalent per week     Comment: occasionally  . Drug Use: No  . Sexual Activity: Not on file   Other Topics Concern  . Not on file   Social History Narrative   Lives in [place] with wife. Dogs. Goats. Children - 4. Grandchildren - 7.      Worked - Sports coach, part time.   Diet - healthy   Exercise - very active      Service - [employer] - [place]     Review of Systems    General:  No chills, fever, night sweats or weight changes.  Cardiovascular:  No dyspnea on exertion, edema, orthopnea, palpitations, paroxysmal nocturnal dyspnea. Positive for chest discomfort. Dermatological: No rash, lesions/masses Respiratory: No cough, dyspnea Urologic: No hematuria, dysuria Abdominal:   No nausea, vomiting, diarrhea, bright red blood per rectum, melena, or hematemesis Neurologic:  No wkns, changes in mental status. Positive for vision changes and pre-syncope. All other systems reviewed and are otherwise negative except as noted above.  Physical Exam    Blood pressure 130/65, pulse 60, temperature 97.7 F (36.5 C), temperature source Oral, resp. rate  18, height 5\' 9"  (1.753 m), weight 223 lb (101.152 kg), SpO2 97 %.  General: Pleasant, Caucasian male appearing in NAD Psych: Normal affect. Neuro: Alert and oriented X 3. Moves all extremities spontaneously. HEENT: Normal  Neck: Supple without bruits or JVD. Lungs:  Resp regular and unlabored, CTA without wheezing or rales. Heart: RRR no s3, s4, or murmurs. Abdomen: Soft, non-tender, non-distended, BS + x 4.  Extremities: No clubbing, cyanosis or edema. DP/PT/Radials 2+ and equal bilaterally.  Labs    Troponin [facility] of Care Test)  Recent Labs  08/22/15 1258 08/22/15 1653  TROPONINI <  0.03 <0.03   Lab Results  Component Value Date   WBC 6.8 08/22/2015   HGB 13.8 08/22/2015   HCT 40.5 08/22/2015   MCV 93.4 08/22/2015   PLT 132* 08/22/2015    Recent Labs Lab 08/22/15 1258  NA 141  K 4.1  CL 112*  CO2 27  BUN 11  CREATININE 0.93  CALCIUM 8.8*  PROT 7.0  BILITOT 0.8  ALKPHOS 76  ALT 26  AST 23  GLUCOSE 113*   Lab Results  Component Value Date   CHOL 142 08/23/2015   HDL 33* 08/23/2015   LDLCALC 76 08/23/2015   TRIG 167* 08/23/2015     Radiology Studies    Dg Chest 2 View: 08/22/2015  CLINICAL DATA:  Increasing weakness, chest pain and shortness breath. EXAM: CHEST  2 VIEW COMPARISON:  07/30/2015 FINDINGS: Postsurgical changes from CABG are stable. Cardiomediastinal silhouette is normal. Mediastinal contours appear intact. There is no evidence of focal airspace consolidation, pleural effusion or pneumothorax. Linear opacity in the left lower lobe likely representing scarring is again seen. Osseous structures are without acute abnormality. Soft tissues are grossly normal. IMPRESSION: No active cardiopulmonary disease. Stable left lung base linear scarring. Electronically Signed   By: Fidela Salisbury M.D.   On: 08/22/2015 13:29    Ct Head Wo Contrast: 08/22/2015  CLINICAL DATA:  Generalized weakness, near syncope. EXAM: CT HEAD WITHOUT CONTRAST TECHNIQUE:  Contiguous axial images were obtained from the base of the skull through the vertex without intravenous contrast. COMPARISON:  None. FINDINGS: Bony calvarium appears intact. Mucous retention cysts are noted in the maxillary sinuses bilaterally. Minimal diffuse cortical atrophy is noted. Mild chronic ischemic white matter disease is noted. No mass effect or midline shift is noted. Ventricular size is within normal limits. There is no evidence of mass lesion, hemorrhage or acute infarction. IMPRESSION: Minimal diffuse cortical atrophy. Mild chronic ischemic white matter disease. No acute intracranial abnormality seen. Electronically Signed   By: Marijo Conception, M.D.   On: 08/22/2015 15:43    EKG & Cardiac Imaging    EKG: Sinus bradycardia, HR 55, occasional PAC's, minimal TWI in V1 and V2 with no acute ST changes noted.   Echocardiogram: Pending  Assessment & Plan    1. Pre-Syncope - the patient was driving yesterday when he began to feel dizzy, have blurred vision and felt like he was going to pass out. Reports his HR was 58 at the time. Upon arrival to the ED, it was in the high-40's.  - has a history of post-conversion pauses with his PAF, but he denies any recent episodes of tachycardia or palpitations.  - No episodes of hypotension this admission. Glucose was 113 upon arrival to the ED. Denied any associated slurred speech or weakness of his extremities which would make a TIA less likely. CT Imaging of the head showed no acute abnormalities. - Agree with decreasing Lopressor from 50mg  BID to 25mg  BID in the setting of bradycardia.  - will plan for 30-day cardiac event monitor at the time of discharge to evaluate for possible cardiac etiology of his event (This has been arranged and will be mailed to his home).  2. Chest Pain in the setting of known CAD - s/p CABG x3 in 2010 w/ LIMA-LAD, SVG-OM, SVG-PDA, occluded SVG x 2 by cath in 09/2009 - has 30 minute episode of "chest fullness" during his  pre-syncopal event. No recurrence since.  - cyclic troponin values have been negative and his EKG shows  no acute changes. - For [employer] today to assess for ischemia and echocardiogram to evaluate LV function and any wall motion abnormalities.  3. PAF - has been in NSR since admission. - takes Amiodarone PRN for episodes of atrial fibrillation at home. - This patients CHA2DS2-VASc Score and unadjusted Ischemic Stroke Rate (% per year) is equal to 3.2 % stroke rate/year from a score of 3 (HTN, Vascular, Age). Continue Eliquis for anticoagulation. - Lopressor was decreased from 50mg  BID to 25mg  BID this admission secondary to bradycardia. Continue to monitor HR and BP response with dose adjustment.  4.  HLD - LDL 76 this admission. HDL 33. Total Cholesterol 142. - continue statin therapy  5. HTN - BP has been 118/58 - 149/81 - continue Amlodipine 5mg  daily and Lopressor 25mg  BID  6. CLL - followed by Dr. Rogue Bussing - last treatment was 08/03/2015.  7. COPD - continue prior to admission medications - per admitting team  8. OSA - on CPAP. Currently going through the process of getting his CPAP titrated.   Signed, Erma Heritage, PA-C 08/23/2015, 10:31 AM Pager: [phone]

## 2015-08-24 ENCOUNTER — Telehealth: Payer: Self-pay

## 2015-08-24 NOTE — Telephone Encounter (Signed)
Will follow up with transitional care management. 

## 2015-08-24 NOTE — Telephone Encounter (Signed)
Transition Care Management Follow-up Telephone Call   Date discharged? 08/23/15   How have you been since you were released from the hospital?  One episode of dizziness today.  No pain.   Do you understand why you were in the hospital? Yes, dizziness, low pulse and low blood pressure.   Do you understand the discharge instructions? I am taking it easy and am not driving.  Expecting the cardiac monitor to arrive next week.   Where were you discharged to? Home   Items Reviewed:  Medications reviewed: Yes, taking all scheduled medications as prescribed and without issues.  Allergies reviewed: Yes, no changes.  Dietary changes reviewed: Yes, problems.  Referrals reviewed: Yes, cardiology appointment made.   Functional Questionnaire:   Activities of Daily Living (ADLs):   He states they are independent in the following: Ambulating, bathing, dressing, toileting, self feeding, grooming. States they require assistance with the following: Meal prep, wife assists.   Any transportation issues/concerns?: No.   Any patient concerns? None at this time.   Confirmed importance and date/time of follow-up visits scheduled Yes, appointment scheduled by front staff on 08/29/15 at 1130.  Provider Appointment booked with Dr. Gilford Rile (PCP).  Confirmed with patient if condition begins to worsen call PCP or go to the ER.  Patient was given the office number and encouraged to call back with question or concerns.  : Yes, patient verbalized understanding.

## 2015-08-27 ENCOUNTER — Encounter (INDEPENDENT_AMBULATORY_CARE_PROVIDER_SITE_OTHER): Payer: Medicare HMO

## 2015-08-27 DIAGNOSIS — R55 Syncope and collapse: Secondary | ICD-10-CM | POA: Diagnosis not present

## 2015-08-27 DIAGNOSIS — R001 Bradycardia, unspecified: Secondary | ICD-10-CM | POA: Diagnosis not present

## 2015-08-29 ENCOUNTER — Ambulatory Visit: Payer: Medicare HMO | Attending: Pulmonary Disease

## 2015-08-29 ENCOUNTER — Encounter: Payer: Self-pay | Admitting: Internal Medicine

## 2015-08-29 ENCOUNTER — Ambulatory Visit (INDEPENDENT_AMBULATORY_CARE_PROVIDER_SITE_OTHER): Payer: Medicare HMO | Admitting: Internal Medicine

## 2015-08-29 VITALS — BP 96/65 | HR 54 | Temp 97.8°F | Ht 69.0 in | Wt 229.5 lb

## 2015-08-29 DIAGNOSIS — G4733 Obstructive sleep apnea (adult) (pediatric): Secondary | ICD-10-CM | POA: Diagnosis not present

## 2015-08-29 DIAGNOSIS — R55 Syncope and collapse: Secondary | ICD-10-CM

## 2015-08-29 DIAGNOSIS — M170 Bilateral primary osteoarthritis of knee: Secondary | ICD-10-CM | POA: Diagnosis not present

## 2015-08-29 NOTE — Progress Notes (Signed)
Notified pt. 

## 2015-08-29 NOTE — Assessment & Plan Note (Signed)
Referral placed to Ortho

## 2015-08-29 NOTE — Progress Notes (Signed)
Pre visit review using our clinic review tool, if applicable. No additional management support is needed unless otherwise documented below in the visit note. 

## 2015-08-29 NOTE — Patient Instructions (Signed)
Continue Holter monitor.  Follow up here in 4 weeks.

## 2015-08-29 NOTE — Assessment & Plan Note (Signed)
Recent hospitalization for pre-syncope. Reviewed hospital records. ECHO normal. Stress test normal. Currently wearing event monitor. CT head was normal. Symptoms as described, most consistent with cardiac event. We discussed other potential causes including CVA. Discussed potential additional workup including MRI brain. However, will wait until results of cardiac monitor complete. Encouraged him not to drive until workup complete.
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
## 2015-09-04 DIAGNOSIS — G4733 Obstructive sleep apnea (adult) (pediatric): Secondary | ICD-10-CM | POA: Diagnosis not present

## 2015-09-05 ENCOUNTER — Telehealth: Payer: Self-pay | Admitting: *Deleted

## 2015-09-05 DIAGNOSIS — G4733 Obstructive sleep apnea (adult) (pediatric): Secondary | ICD-10-CM

## 2015-09-05 NOTE — Telephone Encounter (Signed)
Pt informed order for CPAP was being placed.

## 2015-09-14 ENCOUNTER — Emergency Department: Payer: Medicare HMO

## 2015-09-14 ENCOUNTER — Encounter: Payer: Self-pay | Admitting: Family Medicine

## 2015-09-14 ENCOUNTER — Emergency Department
Admission: EM | Admit: 2015-09-14 | Discharge: 2015-09-14 | Disposition: A | Discharge status: Home / Self Care | Payer: Medicare HMO | Attending: Emergency Medicine | Admitting: Emergency Medicine

## 2015-09-14 ENCOUNTER — Ambulatory Visit (INDEPENDENT_AMBULATORY_CARE_PROVIDER_SITE_OTHER): Payer: Medicare HMO | Admitting: Family Medicine

## 2015-09-14 VITALS — BP 106/68 | HR 143 | Temp 97.6°F | Ht 69.0 in | Wt 231.2 lb

## 2015-09-14 DIAGNOSIS — I4892 Unspecified atrial flutter: Secondary | ICD-10-CM | POA: Diagnosis not present

## 2015-09-14 DIAGNOSIS — Z87891 Personal history of nicotine dependence: Secondary | ICD-10-CM | POA: Insufficient documentation

## 2015-09-14 DIAGNOSIS — I480 Paroxysmal atrial fibrillation: Secondary | ICD-10-CM | POA: Diagnosis not present

## 2015-09-14 DIAGNOSIS — J441 Chronic obstructive pulmonary disease with (acute) exacerbation: Secondary | ICD-10-CM | POA: Diagnosis not present

## 2015-09-14 DIAGNOSIS — I4891 Unspecified atrial fibrillation: Secondary | ICD-10-CM | POA: Diagnosis not present

## 2015-09-14 DIAGNOSIS — I10 Essential (primary) hypertension: Secondary | ICD-10-CM | POA: Diagnosis not present

## 2015-09-14 DIAGNOSIS — R0602 Shortness of breath: Secondary | ICD-10-CM | POA: Diagnosis not present

## 2015-09-14 DIAGNOSIS — Z79899 Other long term (current) drug therapy: Secondary | ICD-10-CM | POA: Diagnosis not present

## 2015-09-14 DIAGNOSIS — Z7951 Long term (current) use of inhaled steroids: Secondary | ICD-10-CM | POA: Insufficient documentation

## 2015-09-14 DIAGNOSIS — R079 Chest pain, unspecified: Secondary | ICD-10-CM | POA: Diagnosis present

## 2015-09-14 LAB — BASIC METABOLIC PANEL
Anion gap: 6 (ref 5–15)
BUN: 15 mg/dL (ref 6–20)
CALCIUM: 8.8 mg/dL — AB (ref 8.9–10.3)
CHLORIDE: 103 mmol/L (ref 101–111)
CO2: 27 mmol/L (ref 22–32)
CREATININE: 1.12 mg/dL (ref 0.61–1.24)
GFR calc non Af Amer: >60 mL/min (ref >60)
GLUCOSE: 107 mg/dL — AB (ref 65–99)
Potassium: 3.4 mmol/L — ABNORMAL LOW (ref 3.5–5.1)
SODIUM: 136 mmol/L (ref 135–145)

## 2015-09-14 LAB — CBC WITH DIFFERENTIAL/PLATELET
BASOS ABS: 0.1 10*3/uL (ref 0–0.1)
Basophils Relative: 1 %
EOS PCT: 2 %
Eosinophils Absolute: 0.2 10*3/uL (ref 0–0.7)
HEMATOCRIT: 44.5 % (ref 40.0–52.0)
Hemoglobin: 15.2 g/dL (ref 13.0–18.0)
LYMPHS ABS: 2.4 10*3/uL (ref 1.0–3.6)
LYMPHS PCT: 27 %
MCH: 32.2 pg (ref 26.0–34.0)
MCHC: 34.1 g/dL (ref 32.0–36.0)
MCV: 94.3 fL (ref 80.0–100.0)
MONO ABS: 1.1 10*3/uL — AB (ref 0.2–1.0)
Monocytes Relative: 13 %
NEUTROS ABS: 5 10*3/uL (ref 1.4–6.5)
Neutrophils Relative %: 57 %
Platelets: 171 10*3/uL (ref 150–440)
RBC: 4.72 MIL/uL (ref 4.40–5.90)
RDW: 13.9 % (ref 11.5–14.5)
WBC: 8.7 10*3/uL (ref 3.8–10.6)

## 2015-09-14 LAB — TROPONIN I
Troponin I: 0.03 ng/mL (ref <0.031)
Troponin I: 0.03 ng/mL (ref <0.031)

## 2015-09-14 LAB — MAGNESIUM: Magnesium: 2 mg/dL (ref 1.7–2.4)

## 2015-09-14 LAB — PHOSPHORUS: PHOSPHORUS: 3.3 mg/dL (ref 2.5–4.6)

## 2015-09-14 IMAGING — DX DG CHEST 1V PORT
1 series · 1 of 1 positions shown · non-contrast
Comparison: [DATE]

CLINICAL DATA: Chest fullness in shortness of breath.  Sore throat.

EXAM:
PORTABLE CHEST 1 VIEW

[chest ap]
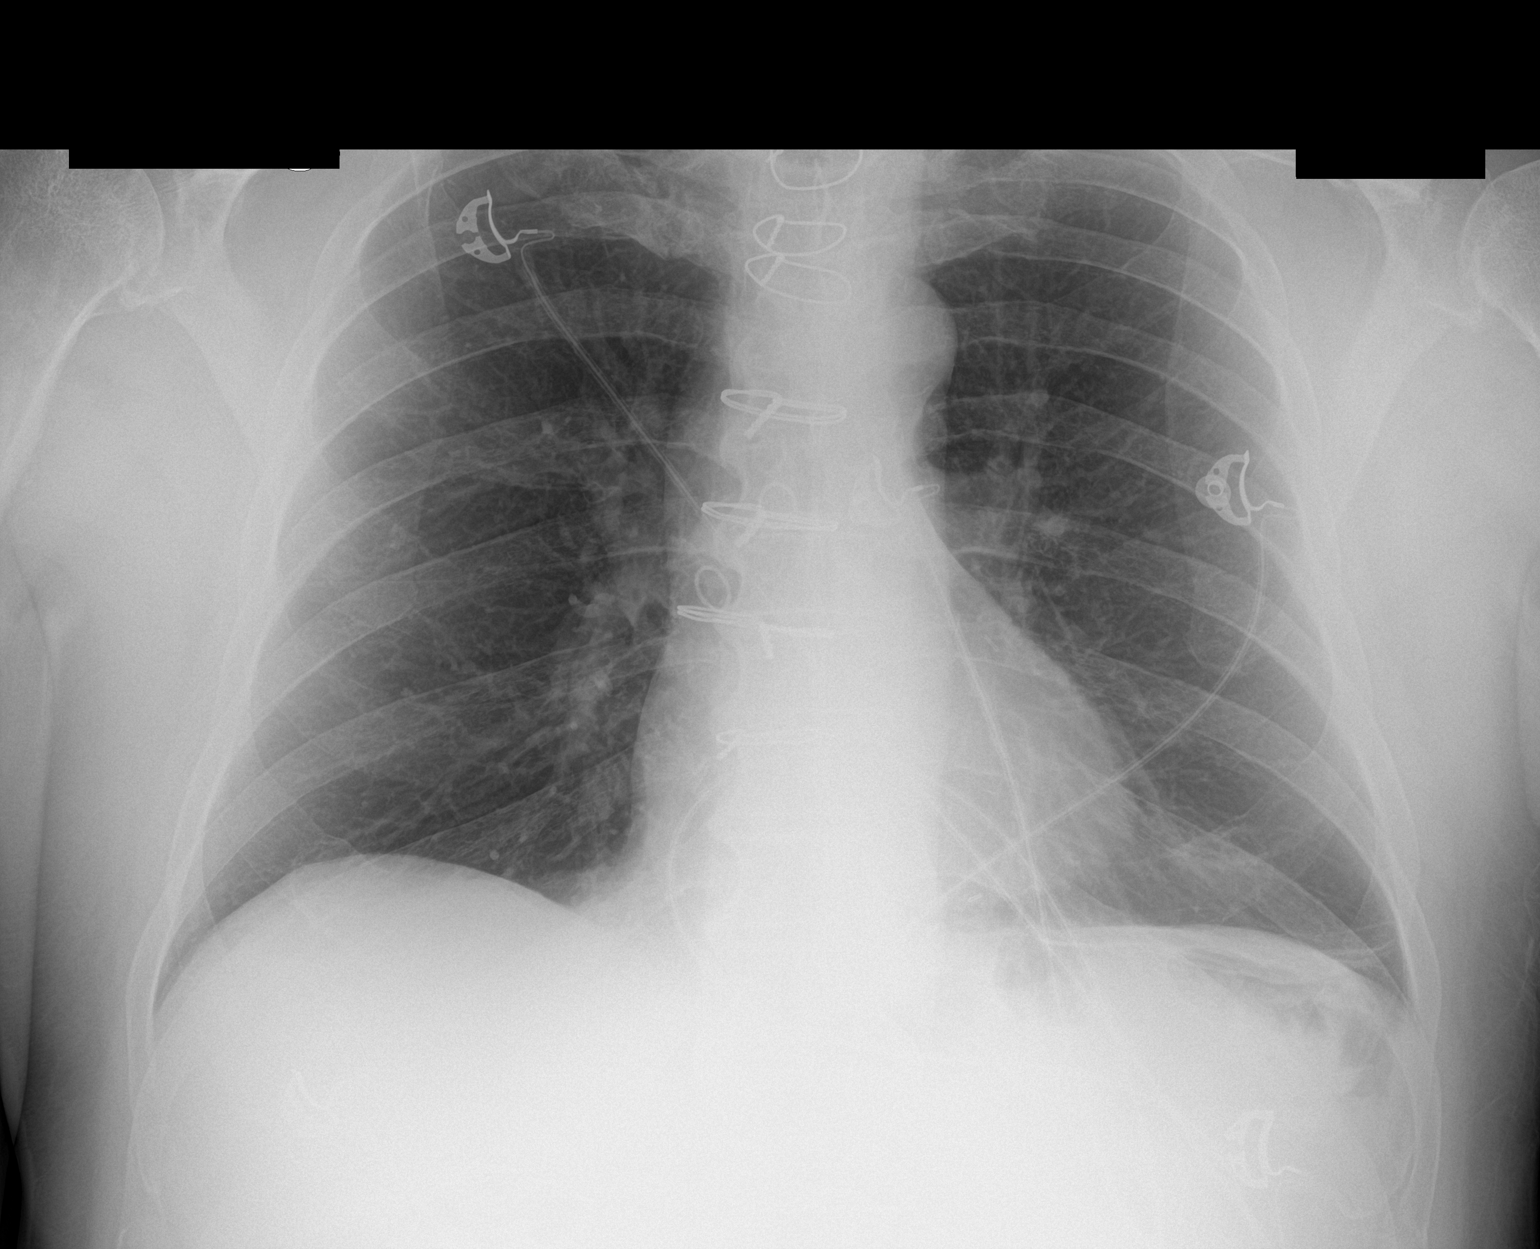

[1 of 1 positions shown; findings below may reference images not displayed]

FINDINGS: Prior CABG. Linear scarring at the left base. Right lung is clear.
Heart is normal size. No effusions. No acute bony abnormality.
IMPRESSION: Left basilar scarring.  No active disease.

## 2015-09-14 MED ORDER — METOPROLOL TARTRATE 1 MG/ML IV SOLN
5.0000 mg | Freq: Once | INTRAVENOUS | Status: AC
  Administered 2015-09-14: 5 mg via INTRAVENOUS
  Filled 2015-09-14: qty 5

## 2015-09-14 MED ORDER — SODIUM CHLORIDE 0.9 % IV BOLUS (SEPSIS)
1000.0000 mL | Freq: Once | INTRAVENOUS | Status: AC
  Administered 2015-09-14: 1000 mL via INTRAVENOUS

## 2015-09-14 MED ORDER — METOPROLOL TARTRATE 25 MG PO TABS
12.5000 mg | ORAL_TABLET | Freq: Once | ORAL | Status: AC
  Administered 2015-09-14: 12.5 mg via ORAL
  Filled 2015-09-14: qty 1

## 2015-09-14 MED ORDER — MAGNESIUM SULFATE 2 GM/50ML IV SOLN
2.0000 g | Freq: Once | INTRAVENOUS | Status: AC
  Administered 2015-09-14: 2 g via INTRAVENOUS
  Filled 2015-09-14: qty 50

## 2015-09-14 MED ORDER — AMIODARONE HCL 200 MG PO TABS
200.0000 mg | ORAL_TABLET | ORAL | Status: AC
  Administered 2015-09-14: 200 mg via ORAL
  Filled 2015-09-14: qty 1

## 2015-09-14 NOTE — ED Notes (Signed)
See triage   States he was not  feeling well for couple days  Developed sore throat and headache  But while at PCP office they noticed he was in Afib with rapid rate   Some SOB with exertion

## 2015-09-14 NOTE — Progress Notes (Signed)
Pre visit review using our clinic review tool, if applicable. No additional management support is needed unless otherwise documented below in the visit note. 

## 2015-09-14 NOTE — ED Provider Notes (Signed)
[facility] Emergency Department Provider Note  ____________________________________________  Time seen: 5:05 PM  I have reviewed the triage vital signs and the nursing notes.   HISTORY  Chief Complaint Atrial Fibrillation    HPI [name] is a 70 y.o. male complains of some chest tightness and shortness of breath in the setting of atrial fibrillation. This feels like his usual atrial fibrillation. When he has atrial fibrillation he takes a metoprolol and amiodarone on a when necessary basis. He is followed by cardiology Dr. Pamala Hurry in. Today he saw his primary care doctor due to his atrial fibrillation and feeling that his heart rate was very fast. He's also recently had a cold with some rhinorrhea and nonproductive cough that feels very routine for him. When they saw he was in a flutter with a heart rate of about 14they sent him to the emergency department for further management. Denies dizziness or syncope.  The chest pain is described as pressure, mild, nonradiating, no associated vomiting or diaphoresis but there is some shortness of breath. No aggravating or alleviating factors. Constant since the onset of his palpitations about 3 hours ago.  He takes Eliquis for his paroxysmal atrial fibrillation. He's been compliant with this therapy.   Past Medical History  Diagnosis Date  . Coronary artery disease     a. 04/2009 CABG x 3 (LIMA->LAD, VG->OM1, VG->PDA);  b. 09/2009 Cath: occluded VG x 2 w/ patent LIMA and L->R collats. EF 55%, mild antlat HK;  c. 10/2011 MV: EF 53%, no isch/infarct-->low risk.  . Pure hypercholesterolemia   . Hypertension   . Atrial fibrillation ([place])     a. Dx 2013, recurred 02/2014, CHA2DS2VASc = 3 -->placed on Eliquis;  b. 02/2014 Echo: EF 50-55%, mid and apical anterior septum and mid and apical inf septum are abnl, mild to mod Ao sclerosis w/o AS.  Marland Kitchen History of arthritis   . Arthritis   . Chicken pox   . Rheumatic fever   . COPD  (chronic obstructive pulmonary disease) ([place])   . Arthritis   . Chronic lymphocytic leukemia ([place])     a. Dx 02/2014.  Marland Kitchen OSA (obstructive sleep apnea)     a. On CPAP     Patient Active Problem List   Diagnosis Date Noted  . Atrial fibrillation with RVR ([place]) 09/14/2015  . Pre-syncope 08/23/2015  . Near syncope   . CLL (chronic lymphocytic leukemia) ([place])   . Bradycardia   . PTSD (post-traumatic stress disorder)   . Chest pain 08/22/2015  . Osteoarthritis of both knees 07/05/2015  . Dog bite of finger 09/05/2014  . Open fracture of tuft of distal phalanx of finger 09/05/2014  . Chronic lymphatic leukemia.  Stage 2 given leukocytosis, lymphadenopathy and splenomegaly. Sept 2015 - Rapidly progressive bulky lymphadenopathy. Oct 2015 - Positive for ZAP-70 and CD38. 03/16/2014  . Leukocytosis 03/07/2014  . Medicare annual wellness visit, subsequent 07/08/2012  . Screening for colon cancer 01/07/2012  . COPD (chronic obstructive pulmonary disease) ([place]) 01/01/2012  . Shortness of breath 10/09/2011  . Paroxysmal atrial fibrillation ([place]) 10/09/2011  . HYPERCHOLESTEROLEMIA 11/28/2009  . HYPERTENSION, BENIGN 11/28/2009  . CAD, ARTERY BYPASS GRAFT 11/28/2009  . TOBACCO ABUSE, HX OF 11/28/2009     Past Surgical History  Procedure Laterality Date  . Cardiac catheterization  2011    Dr Fletcher Anon  . Cholecystectomy    . Appendectomy  1988  . Tonsillectomy and adenoidectomy  1956  . Coronary artery bypass graft  2010    3v  . Hernia repair      abdominal  . Hernia repair       Current Outpatient Rx  Name  Route  Sig  Dispense  Refill  . amiodarone (PACERONE) 200 MG tablet   Oral   Take 1 tablet (200 mg total) by mouth 2 (two) times daily as needed (for A-fib).   60 tablet   3   . amLODipine (NORVASC) 5 MG tablet   Oral   Take 5 mg by mouth daily.         Marland Kitchen apixaban (ELIQUIS) 5 MG TABS tablet   Oral   Take 1 tablet (5 mg total) by mouth 2 (two) times daily.   60 tablet    6   . atorvastatin (LIPITOR) 40 MG tablet   Oral   Take 40 mg by mouth at bedtime.         . budesonide-formoterol (SYMBICORT) 160-4.5 MCG/ACT inhaler   Inhalation   Inhale 2 puffs into the lungs 2 (two) times daily.         . Coenzyme Q10 (CO Q-10) 100 MG CAPS   Oral   Take 100 mg by mouth at bedtime.          Marland Kitchen ezetimibe (ZETIA) 10 MG tablet   Oral   Take 10 mg by mouth daily.         . metoprolol (LOPRESSOR) 25 MG tablet   Oral   Take 0.5 tablets (12.5 mg total) by mouth 2 (two) times daily.   30 tablet   2   . Multiple Vitamin (MULTIVITAMIN WITH MINERALS) TABS tablet   Oral   Take 1 tablet by mouth daily.         . nitroGLYCERIN (NITROSTAT) 0.4 MG SL tablet   Sublingual   Place 0.4 mg under the tongue every 5 (five) minutes as needed for chest pain.          Marland Kitchen tiotropium (SPIRIVA) 18 MCG inhalation capsule   Inhalation   Place 18 mcg into inhaler and inhale at bedtime.         . traMADol (ULTRAM) 50 MG tablet   Oral   Take 1 tablet (50 mg total) by mouth every 6 (six) hours as needed. Patient taking differently: Take 50 mg by mouth every 6 (six) hours as needed for moderate pain.    90 tablet   0      Allergies Review of patient's allergies indicates no known allergies.   Family History  Problem Relation Age of Onset  . Coronary artery disease      family history  . Heart disease Mother     Social History Social History  Substance Use Topics  . Smoking status: Former Smoker -- 1.00 packs/day for 40 years    Types: Cigarettes    Quit date: 07/21/2006  . Smokeless tobacco: Never Used  . Alcohol Use: 0.5 oz/week    1 Standard drinks or equivalent per week     Comment: occasionally    Review of Systems  Constitutional:   No fever or chills. No weight changes Eyes:   No blurry vision or double vision.  ENT:   No sore throat.  Cardiovascular:    positive as abovest pain. Respiratory:    positive as aboveea or  cough. Gastrointestinal:   Negative for abdominal pain, vomiting and diarrhea.  No BRBPR or melena. Genitourinary:   Negative for dysuria or difficulty urinating. Musculoskeletal:  Negative for back pain. No joint swelling or pain. Skin:   Negative for rash. Neurological:   Negative for headaches, focal weakness or numbness. Psychiatric:  No anxiety or depression.   Endocrine:  No changes in energy or sleep difficulty.  10-point ROS otherwise negative.  ____________________________________________   PHYSICAL EXAM:  VITAL SIGNS: ED Triage Vitals  Enc Vitals Group     BP 09/14/15 1650 120/90 mmHg     Pulse Rate 09/14/15 1650 143     Resp 09/14/15 1650 24     Temp 09/14/15 1650 98.5 F (36.9 C)     Temp Source 09/14/15 1650 Oral     SpO2 09/14/15 1650 97 %     Weight 09/14/15 1650 230 lb (104.327 kg)     Height 09/14/15 1650 5\' 9"  (1.753 m)     Head Cir --      Peak Flow --      Pain Score 09/14/15 1651 5     Pain Loc --      Pain Edu? --      Excl. in [place]? --     Vital signs reviewed, nursing assessments reviewed.   Constitutional:   Alert and oriented. Well appearing and in no distress. energetic and in good spirits  Eyes:   No scleral icterus. No conjunctival pallor. PERRL. EOMI ENT   Head:   Normocephalic and atraumatic.   Nose:   No congestion/rhinnorhea. No septal hematoma   Mouth/Throat:   MMM, no pharyngeal erythema. No peritonsillar mass.    Neck:   No stridor. No SubQ emphysema. No meningismus. Hematological/Lymphatic/Immunilogical:   No cervical lymphadenopathy. Cardiovascular: Regular rate, tachycardia heart rate 140-150. Marland Kitchen Symmetric bilateral radial and DP pulses.  No murmurs.  Respiratory:   Normal respiratory effort without tachypnea nor retractions. Breath sounds are clear and equal bilaterally. No wheezes/rales/rhonchi. Gastrointestinal:   Soft and nontender. Non distended. There is no CVA tenderness.  No rebound, rigidity, or  guarding. Genitourinary:   deferred Musculoskeletal:   Nontender with normal range of motion in all extremities. No joint effusions.  No lower extremity tenderness.  No edema. Neurologic:   Normal speech and language.  CN 2-10 normal. Motor grossly intact. No gross focal neurologic deficits are appreciated.  Skin:    Skin is warm, dry and intact. No rash noted.  No petechiae, purpura, or bullae. Psychiatric:   Mood and affect are normal. ____________________________________________    LABS (pertinent positives/negatives) (all labs ordered are listed, but only abnormal results are displayed) Labs Reviewed  BASIC METABOLIC PANEL - Abnormal; Notable for the following:    Potassium 3.4 (*)    Glucose, Bld 107 (*)    Calcium 8.8 (*)    All other components within normal limits  CBC WITH DIFFERENTIAL/PLATELET - Abnormal; Notable for the following:    Monocytes Absolute 1.1 (*)    All other components within normal limits  MAGNESIUM  PHOSPHORUS  TROPONIN I  TROPONIN I   ____________________________________________   EKG  Interpreted by me Atrial flutter with 21 AV conduction with a rate of 144. Normal axis, normal intervals. Normal QRS ST segments and T waves   ____________________________________________    RADIOLOGY  Chest x-ray unremarkable  ____________________________________________   PROCEDURES CRITICAL CARE Performed by: Joni Fears, Tai Syfert   Total critical care time: 35 minutes  Critical care time was exclusive of separately billable procedures and treating other patients.  Critical care was necessary to treat or prevent imminent or life-threatening deterioration.  Critical care was  time spent personally by me on the following activities: development of treatment plan with patient and/or surrogate as well as nursing, discussions with consultants, evaluation of patient's response to treatment, examination of patient, obtaining history from patient or  surrogate, ordering and performing treatments and interventions, ordering and review of laboratory studies, ordering and review of radiographic studies, pulse oximetry and re-evaluation of patient's condition.   ____________________________________________   INITIAL IMPRESSION / ASSESSMENT AND PLAN / ED COURSE  Pertinent labs & imaging results that were available during my care of the patient were reviewed by me and considered in my medical decision making (see chart for details).  Patient presents with atrial flutter which is symptomatic with chest pressure and dyspnea. We'll give him IV metoprolol in intermittent boluses until rate control was achieved, and then give him his usual oral amiodarone dose. I'll then discuss with cardiology regarding overnight observation versus clinic follow-up.   ----------------------------------------- 9:06 PM on 09/14/2015 -----------------------------------------  Vitals remained stable. At 6:30 PM after IV metoprolol 1, patient was rate controlled and his symptoms resolved. Initial troponin was 0.03. Repeat troponin was 0.03. I did discuss with cardiology Dr. Stanford Breed who noted that as long as enzymes are unremarkable and the patient is rate controlled he can follow-up at home. Patient was given his oral amiodarone dose. He's remained rate controlled with a heart rate of 95-105 throughout his time in the emergency department after initial IV metoprolol.    ____________________________________________   FINAL CLINICAL IMPRESSION(S) / ED DIAGNOSES  Final diagnoses:  Paroxysmal atrial fibrillation ([place])  Atrial flutter, unspecified type [facility])      Carrie Mew, MD 09/14/15 2108

## 2015-09-14 NOTE — ED Notes (Signed)
Pt sent from PCP office for Afib RVR, pt has hx of Afib, pt was seeing PCP for sore throat, pt on eliquis, pt states chest fullness and SOB

## 2015-09-14 NOTE — Discharge Instructions (Signed)
Atrial Flutter Atrial flutter is a heart rhythm that can cause the heart to beat very fast (tachycardia). It originates in the upper chambers of the heart (atria). In atrial flutter, the top chambers of the heart (atria) often beat much faster than the bottom chambers of the heart (ventricles). Atrial flutter has a regular "saw toothed" appearance in an EKG readout. An EKG is a test that records the electrical activity of the heart. Atrial flutter can cause the heart to beat up to 150 beats per minute (BPM). Atrial flutter can either be short lived (paroxysmal) or permanent.  CAUSES  Causes of atrial flutter can be many. Some of these include:  Heart related issues:  Heart attack (myocardial infarction).  Heart failure.  Heart valve problems.  Poorly controlled high blood pressure (hypertension).  After open heart surgery.  Lung related issues:  A blood clot in the lungs (pulmonary embolism).  Chronic obstructive pulmonary disease (COPD). Medications used to treat COPD can attribute to atrial flutter.  Other related causes:  Hyperthyroidism.  Caffeine.  Some decongestant cold medications.  Low electrolyte levels such as potassium or magnesium.  Cocaine. SYMPTOMS  An awareness of your heart beating rapidly (palpitations).  Shortness of breath.  Chest pain.  Low blood pressure (hypotension).  Dizziness or fainting. DIAGNOSIS  Different tests can be performed to diagnose atrial flutter.   An EKG.  Holter monitor. This is a 24-hour recording of your heart rhythm. You will also be given a diary. Write down all symptoms that you have and what you were doing at the time you experienced symptoms.  Cardiac event monitor. This small device can be worn for up to 30 days. When you have heart symptoms, you will push a button on the device. This will then record your heart rhythm.  Echocardiogram. This is an imaging test to look at your heart. Your caregiver will look at your  heart valves and the ventricles.  Stress test. This test can help determine if the atrial flutter is related to exercise or if coronary artery disease is present.  Laboratory studies will look at certain blood levels like:  Complete blood count (CBC).  Potassium.  Magnesium.  Thyroid function. TREATMENT  Treatment of atrial flutter varies. A combination of therapies may be used or sometimes atrial flutter may need only 1 type of treatment.  Lab work: If your blood work, such as your electrolytes (potassium, magnesium) or your thyroid function tests, are abnormal, your caregiver will treat them accordingly.  Medication:  There are several different types of medications that can convert your heart to a normal rhythm and prevent atrial flutter from reoccurring.  Nonsurgical procedures: Nonsurgical techniques may be used to control atrial flutter. Some examples include:  Cardioversion. This technique uses either drugs or an electrical shock to restore a normal heart rhythm:  Cardioversion drugs may be given through an intravenous (IV) line to help "reset" the heart rhythm.  In electrical cardioversion, your caregiver shocks your heart with electrical energy. This helps to reset the heartbeat to a normal rhythm.  Ablation. If atrial flutter is a persistent problem, an ablation may be needed. This procedure is done under mild sedation. High frequency radio-wave energy is used to destroy the area of heart tissue responsible for atrial flutter. SEEK IMMEDIATE MEDICAL CARE IF:  You have:  Dizziness.  Near fainting or fainting.  Shortness of breath.  Chest pain or pressure.  Sudden nausea or vomiting.  Profuse sweating. If you have the above symptoms,  call your local emergency service immediately! Do not drive yourself to the hospital. MAKE SURE YOU:   Understand these instructions.  Will watch your condition.  Will get help right away if you are not doing well or get worse.    This information is not intended to replace advice given to you by your health care provider. Make sure you discuss any questions you have with your health care provider.   Document Released: 11/23/2008 Document Revised: 07/28/2014 Document Reviewed: 01/19/2015 Elsevier Interactive Patient Education 2016 Else[employer]  Atrial Fibrillation Atrial fibrillation is a type of irregular or rapid heartbeat (arrhythmia). In atrial fibrillation, the heart quivers continuously in a chaotic pattern. This occurs when parts of the heart receive disorganized signals that make the heart unable to pump blood normally. This can increase the risk for stroke, heart failure, and other heart-related conditions. There are different types of atrial fibrillation, including:  Paroxysmal atrial fibrillation. This type starts suddenly, and it usually stops on its own shortly after it starts.  Persistent atrial fibrillation. This type often lasts longer than a week. It may stop on its own or with treatment.  Long-lasting persistent atrial fibrillation. This type lasts longer than 12 months.  Permanent atrial fibrillation. This type does not go away. Talk with your health care provider to learn about the type of atrial fibrillation that you have. CAUSES This condition is caused by some heart-related conditions or procedures, including:  A heart attack.  Coronary artery disease.  Heart failure.  Heart valve conditions.  High blood pressure.  Inflammation of the sac that surrounds the heart (pericarditis).  Heart surgery.  Certain heart rhythm disorders, such as Wolf-Parkinson-White syndrome. Other causes include:  Pneumonia.  Obstructive sleep apnea.  Blockage of an artery in the lungs (pulmonary embolism, or PE).  Lung cancer.  Chronic lung disease.  Thyroid problems, especially if the thyroid is overactive (hyperthyroidism).  Caffeine.  Excessive alcohol use or illegal drug use.  Use of  some medicines, including certain decongestants and diet pills. Sometimes, the cause cannot be found. RISK FACTORS This condition is more likely to develop in:  People who are older in age.  People who smoke.  People who have diabetes mellitus.  People who are overweight (obese).  Athletes who exercise vigorously. SYMPTOMS Symptoms of this condition include:  A feeling that your heart is beating rapidly or irregularly.  A feeling of discomfort or pain in your chest.  Shortness of breath.  Sudden light-headedness or weakness.  Getting tired easily during exercise. In some cases, there are no symptoms. DIAGNOSIS Your health care provider may be able to detect atrial fibrillation when taking your pulse. If detected, this condition may be diagnosed with:  An electrocardiogram (ECG).  A Holter monitor test that records your heartbeat patterns over a 24-hour period.  Transthoracic echocardiogram (TTE) to evaluate how blood flows through your heart.  Transesophageal echocardiogram (TEE) to view more detailed images of your heart.  A stress test.  Imaging tests, such as a CT scan or chest X-ray.  Blood tests. TREATMENT The main goals of treatment are to prevent blood clots from forming and to keep your heart beating at a normal rate and rhythm. The type of treatment that you receive depends on many factors, such as your underlying medical conditions and how you feel when you are experiencing atrial fibrillation. This condition may be treated with:  Medicine to slow down the heart rate, bring the heart's rhythm back to normal, or prevent  clots from forming.  Electrical cardioversion. This is a procedure that resets your heart's rhythm by delivering a controlled, low-energy shock to the heart through your skin.  Different types of ablation, such as catheter ablation, catheter ablation with pacemaker, or surgical ablation. These procedures destroy the heart tissues that send  abnormal signals. When the pacemaker is used, it is placed under your skin to help your heart beat in a regular rhythm. HOME CARE INSTRUCTIONS  Take over-the counter and prescription medicines only as told by your health care provider.  If your health care provider prescribed a blood-thinning medicine (anticoagulant), take it exactly as told. Taking too much blood-thinning medicine can cause bleeding. If you do not take enough blood-thinning medicine, you will not have the protection that you need against stroke and other problems.  Do not use tobacco products, including cigarettes, chewing tobacco, and e-cigarettes. If you need help quitting, ask your health care provider.  If you have obstructive sleep apnea, manage your condition as told by your health care provider.  Do not drink alcohol.  Do not drink beverages that contain caffeine, such as coffee, soda, and tea.  Maintain a healthy weight. Do not use diet pills unless your health care provider approves. Diet pills may make heart problems worse.  Follow diet instructions as told by your health care provider.  Exercise regularly as told by your health care provider.  Keep all follow-up visits as told by your health care provider. This is important. PREVENTION  Avoid drinking beverages that contain caffeine or alcohol.  Avoid certain medicines, especially medicines that are used for breathing problems.  Avoid certain herbs and herbal medicines, such as those that contain ephedra or ginseng.  Do not use illegal drugs, such as cocaine and amphetamines.  Do not smoke.  Manage your high blood pressure. SEEK MEDICAL CARE IF:  You notice a change in the rate, rhythm, or strength of your heartbeat.  You are taking an anticoagulant and you notice increased bruising.  You tire more easily when you exercise or exert yourself. SEEK IMMEDIATE MEDICAL CARE IF:  You have chest pain, abdominal pain, sweating, or weakness.  You feel  nauseous.  You notice blood in your vomit, bowel movement, or urine.  You have shortness of breath.  You suddenly have swollen feet and ankles.  You feel dizzy.  You have sudden weakness or numbness of the face, arm, or leg, especially on one side of the body.  You have trouble speaking, trouble understanding, or both (aphasia).  Your face or your eyelid droops on one side. These symptoms may represent a serious problem that is an emergency. Do not wait to see if the symptoms will go away. Get medical help right away. Call your local emergency services (911 in the [place] Do not drive yourself to the hospital.   This information is not intended to replace advice given to you by your health care provider. Make sure you discuss any questions you have with your health care provider.   Document Released: 07/07/2005 Document Revised: 03/28/2015 Document Reviewed: 11/01/2014 Elsevier Interactive Patient Education [employer].

## 2015-09-14 NOTE — Assessment & Plan Note (Addendum)
Patient presents with complaint of sore throat and found to be in atrial  flutter with rapid ventricular rate. He is visibly short of breath in the office. Given  Atrial flutter with RVR and shortness of breath he warrants evaluation in the emergency room. I advised patient that he needed EMS transport, though he declined this.  I discussed the risks of transporting himself. He signed AMA paperwork and stated that he would have his wife drive him to the emergency room. CMA will call the ED charge nurse and inform them that the patient is on his way. He was given precautions to call 911 in route to the emergency room.

## 2015-09-14 NOTE — Progress Notes (Signed)
Patient ID: [name], male   DOB: 04/20/1946, 70 y.o.   MRN: [id number]  [name], MD Phone: [phone]  [name] is a 70 y.o. male who presents today for same-day visit.  Patient with onset of sore throat several days ago. Wife possibly saw white spots on back of his throat. He notes some coughing and congestion. Mild soreness in his throat today. Reports this is better today. No fevers.  Patient notes about 2 hours ago his heart started to race. He became increasingly short of breath. He notes his pulse at home has been into the 140s. Notes he typically goes in and out of A. fib. Pulse typically runs in the 60-70 range. He denies chest pain at this time.  PMH:  Former smoker.   ROS see history of present illness  Objective  Physical Exam Filed Vitals:   09/14/15 1602  BP: 106/68  Pulse: 143  Temp: 97.6 F (36.4 C)    BP Readings from Last 3 Encounters:  09/14/15 106/68  08/29/15 96/65  08/23/15 149/67   Wt Readings from Last 3 Encounters:  09/14/15 231 lb 3.2 oz (104.872 kg)  08/29/15 229 lb 8 oz (104.101 kg)  08/22/15 223 lb (101.152 kg)    Physical Exam  Constitutional: He appears distressed (appears to be working to breathe).  HENT:  Head: Normocephalic and atraumatic.  Right Ear: External ear normal.  Left Ear: External ear normal.  Mouth/Throat: Oropharynx is clear and moist. No oropharyngeal exudate.  Eyes: Conjunctivae are normal. Pupils are equal, round, and reactive to light.  Neck: Neck supple.  Cardiovascular: Exam reveals no gallop and no friction rub.   No murmur heard. Tachycardic, difficult to tell of its irregular  Pulmonary/Chest: Breath sounds normal. He is in respiratory distress (Moderate increased work of breathing). He has no wheezes. He has no rales.  Lymphadenopathy:    He has no cervical adenopathy.  Neurological: He is alert. Gait normal.  Skin: Skin is warm and dry. He is not diaphoretic.    EKG: Atrial flutter,  rate 145, right bundle branch block, anterior lateral ST depressions  Assessment/Plan: Please see individual problem list.  Atrial fibrillation with RVR ([place]) Patient presents with complaint of sore throat and found to be in atrial fibrillation with rapid ventricular rate. He is visibly short of breath in the office. Given A. fib with RVR and shortness of breath he warrants evaluation in the emergency room. I advised patient that he needed EMS transport, though he declined this.  I discussed the risks of transporting himself. He signed AMA paperwork and stated that he would have his wife drive him to the emergency room. CMA will call the ED charge nurse and inform them that the patient is on his way. He was given precautions to call 911 in route to the emergency room.    Orders Placed This Encounter  Procedures  . EKG 12-Lead    [name]

## 2015-09-14 NOTE — ED Notes (Signed)
Reviewed d/c instructions, and follow-up care with pt. Pt verbalized understanding 

## 2015-09-17 ENCOUNTER — Encounter: Payer: Self-pay | Admitting: Cardiovascular Disease

## 2015-09-17 ENCOUNTER — Ambulatory Visit (INDEPENDENT_AMBULATORY_CARE_PROVIDER_SITE_OTHER): Payer: Medicare HMO | Admitting: Cardiovascular Disease

## 2015-09-17 ENCOUNTER — Telehealth: Payer: Self-pay | Admitting: Cardiovascular Disease

## 2015-09-17 VITALS — BP 130/90 | HR 126 | Ht 69.0 in | Wt 231.2 lb

## 2015-09-17 DIAGNOSIS — I25701 Atherosclerosis of coronary artery bypass graft(s), unspecified, with angina pectoris with documented spasm: Secondary | ICD-10-CM

## 2015-09-17 DIAGNOSIS — J432 Centrilobular emphysema: Secondary | ICD-10-CM

## 2015-09-17 DIAGNOSIS — I4891 Unspecified atrial fibrillation: Secondary | ICD-10-CM | POA: Diagnosis not present

## 2015-09-17 DIAGNOSIS — E7800 Pure hypercholesterolemia, unspecified: Secondary | ICD-10-CM

## 2015-09-17 DIAGNOSIS — I483 Typical atrial flutter: Secondary | ICD-10-CM

## 2015-09-17 DIAGNOSIS — I10 Essential (primary) hypertension: Secondary | ICD-10-CM | POA: Diagnosis not present

## 2015-09-17 DIAGNOSIS — R0602 Shortness of breath: Secondary | ICD-10-CM

## 2015-09-17 MED ORDER — METOPROLOL TARTRATE 25 MG PO TABS: 25.0000 mg | ORAL_TABLET | Freq: Two times a day (BID) | ORAL | Status: DC

## 2015-09-17 MED ORDER — FUROSEMIDE 20 MG PO TABS: 20.0000 mg | ORAL_TABLET | Freq: Two times a day (BID) | ORAL | Status: DC | PRN

## 2015-09-17 MED ORDER — DILTIAZEM HCL ER COATED BEADS 120 MG PO CP24: 120.0000 mg | ORAL_CAPSULE | Freq: Every day | ORAL | Status: DC

## 2015-09-17 NOTE — Assessment & Plan Note (Signed)
Currently with no symptoms of angina. No further workup at this time. Continue current medication regimen. 

## 2015-09-17 NOTE — Progress Notes (Signed)
Patient ID: [name], male    DOB: 11-23-1945, 70 y.o.   MRN: [id number]  HPI Comments: [name] is a 70 year old gentleman with a history of smoking, coronary artery disease, cabg, Last catheterization March 2011 with occluded vein graft 2, patent LIMA, collaterals from left to right, atrial fibrillation March 2013, who presents for follow-up of his coronary artery disease and atrial fibrillation. 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 Converted back to normal with metoprolol. Possible conversion pause.  In follow-up today, he reports having palpitations tachycardia starting last week. He has a 30 day monitor in place showing atrial fibrillation/flutter starting September 12 2015,  Atrial flutter has been persistent since that time. He went to the emergency room February 24, records reviewed. He was given amiodarone and discharged home. He reports his heart rate was not well controlled. Since then heart rate has been up and down, frequently in the 140 range or higher. He has some shortness of breath, unable to walk very fast secondary to symptoms. He has been taking amiodarone 200 mg twice a day for the past 3 or 4 days. Denies any leg edema, takes he may have some abdominal bloating.  EKG on today's visit shows atrial flutter with ventricular rate 126 bpm, nonspecific ST abnormality  Other past medical history reviewed  tachycardia concerning for atrial fibrillation 05/10/2015. Lasted only several minutes Rate possibly up to 140 bpm.  Previously has been very active, building a barn, tool shed, taking care of animals  some benefits from the [place] for agent orange exposure and his cancer   total cholesterol 137, LDL 86, earlier in the year  chronic leg pain since he was young.  Previous leg pain with Crestor   admitted to the hospital on September 26 2011 for malaise, appearing pale, irregular heart rhythm and noted to be in  atrial fibrillation. He was given medication for rhythm control and he converted to normal sinus rhythm later that evening. No longer on anticoagulation. He has been maintaining normal sinus rhythm No symptoms concerning for atrial fibrillation since that time  Echocardiogram showed ejection fraction 35-45%, mildly elevated right ventricular systolic pressures estimated at 30-40 mm mercury.  Previous catheterization showing occluded LAD in the mid vessel, 60% left main, 99% distal RCA who was sent to [name] in October 2010 for bypass surgery. He received 3 vessel bypass by Dr.Gerhardt with a LIMA to the LAD, vein graft to the OM1 and vein graft to the PDA, with subsequent cardiac catheter March 2011 for chest discomfort showing occluded vein grafts x2 with patent LIMA. He has collateral vessels from left to right. Ejection fraction 55% mild anterolateral hypokinesis.  No Known Allergies  Current Outpatient Prescriptions on File Prior to Visit  Medication Sig Dispense Refill  . amiodarone (PACERONE) 200 MG tablet Take 1 tablet (200 mg total) by mouth 2 (two) times daily as needed (for A-fib). 60 tablet 3  . apixaban (ELIQUIS) 5 MG TABS tablet Take 1 tablet (5 mg total) by mouth 2 (two) times daily. 60 tablet 6  . atorvastatin (LIPITOR) 40 MG tablet Take 40 mg by mouth at bedtime.    . budesonide-formoterol (SYMBICORT) 160-4.5 MCG/ACT inhaler Inhale 2 puffs into the lungs 2 (two) times daily.    . Coenzyme Q10 (CO Q-10) 100 MG CAPS Take 100 mg by mouth at bedtime.     . Multiple Vitamin (MULTIVITAMIN WITH MINERALS) TABS tablet Take  1 tablet by mouth daily.    . nitroGLYCERIN (NITROSTAT) 0.4 MG SL tablet Place 0.4 mg under the tongue every 5 (five) minutes as needed for chest pain.     Marland Kitchen tiotropium (SPIRIVA) 18 MCG inhalation capsule Place 18 mcg into inhaler and inhale at bedtime.    . traMADol (ULTRAM) 50 MG tablet Take 1 tablet (50 mg total) by mouth every 6 (six) hours as needed. (Patient  taking differently: Take 50 mg by mouth every 6 (six) hours as needed for moderate pain. ) 90 tablet 0  . [DISCONTINUED] isosorbide mononitrate (IMDUR) 30 MG 24 hr tablet Take 1 tablet (30 mg total) by mouth daily. 30 tablet 6   No current facility-administered medications on file prior to visit.    Past Medical History  Diagnosis Date  . Coronary artery disease     a. 04/2009 CABG x 3 (LIMA->LAD, VG->OM1, VG->PDA);  b. 09/2009 Cath: occluded VG x 2 w/ patent LIMA and L->R collats. EF 55%, mild antlat HK;  c. 10/2011 MV: EF 53%, no isch/infarct-->low risk.  . Pure hypercholesterolemia   . Hypertension   . Atrial fibrillation ([place])     a. Dx 2013, recurred 02/2014, CHA2DS2VASc = 3 -->placed on Eliquis;  b. 02/2014 Echo: EF 50-55%, mid and apical anterior septum and mid and apical inf septum are abnl, mild to mod Ao sclerosis w/o AS.  Marland Kitchen History of arthritis   . Arthritis   . Chicken pox   . Rheumatic fever   . COPD (chronic obstructive pulmonary disease) ([place])   . Arthritis   . Chronic lymphocytic leukemia ([place])     a. Dx 02/2014.  Marland Kitchen OSA (obstructive sleep apnea)     a. On CPAP    Past Surgical History  Procedure Laterality Date  . Cardiac catheterization  2011    Dr Fletcher Anon  . Cholecystectomy    . Appendectomy  1988  . Tonsillectomy and adenoidectomy  1956  . Coronary artery bypass graft  2010    3v  . Hernia repair      abdominal  . Hernia repair      Social History  reports that he quit smoking about 9 years ago. His smoking use included Cigarettes. He has a 40 pack-year smoking history. He has never used smokeless tobacco. He reports that he does not drink alcohol or use illicit drugs.  Family History family history includes Heart disease in his mother.   Review of Systems  Constitutional: Negative.   Eyes: Negative.   Respiratory: Positive for shortness of breath.   Cardiovascular: Positive for palpitations.  Gastrointestinal: Negative.   Endocrine: Negative.    Musculoskeletal: Positive for arthralgias and gait problem.  Neurological: Negative.   Psychiatric/Behavioral: Negative.   All other systems reviewed and are negative.  BP 130/90 mmHg  Pulse 126  Ht 5\' 9"  (1.753 m)  Wt 231 lb 4 oz (104.894 kg)  BMI 34.13 kg/m2  Physical Exam  Constitutional: He is oriented to person, place, and time. He appears well-developed and well-nourished.  HENT:  Head: Normocephalic.  Nose: Nose normal.  Mouth/Throat: Oropharynx is clear and moist.  Eyes: Conjunctivae are normal. Pupils are equal, round, and reactive to light.  Neck: Normal range of motion. Neck supple. No JVD present.  Cardiovascular: Regular rhythm, S1 normal, S2 normal, normal heart sounds and intact distal pulses.  Tachycardia present.  Exam reveals no gallop and no friction rub.   No murmur heard. Pulmonary/Chest: Effort normal and breath sounds normal.  No respiratory distress. He has no wheezes. He has no rales. He exhibits no tenderness.  Abdominal: Soft. Bowel sounds are normal. He exhibits no distension. There is no tenderness.  Musculoskeletal: Normal range of motion. He exhibits no edema or tenderness.  Lymphadenopathy:    He has no cervical adenopathy.  Neurological: He is alert and oriented to person, place, and time. Coordination normal.  Skin: Skin is warm and dry. No rash noted. No erythema.  Psychiatric: He has a normal mood and affect. His behavior is normal. Judgment and thought content normal.      Assessment and Plan   Nursing note and vitals reviewed.

## 2015-09-17 NOTE — Assessment & Plan Note (Signed)
Long smoking history Likely contributing to his underlying shortness of breath Otherwise stable

## 2015-09-17 NOTE — Assessment & Plan Note (Addendum)
Hospital records reviewed 30 day monitor results reviewed Long discussion with family concerning atrial flutter Ventricular rate is elevated for the past several days In an effort to control his ventricular rate, we have recommended he stop amlodipine, start diltiazem 120 g daily.Increase his metoprolol up to 25 mill grams twice a day, also recommended he increase amiodarone up to 400 mg twice a day. Continue anticoagulation. He is possibly interested in cardioversion, we will try to schedule this in 3 days' time, March 2 early in the morning. Back up in clinic visit with [place] in the afternoon of March 2 to evaluate his heart rate if cardioversion is not possible

## 2015-09-17 NOTE — Assessment & Plan Note (Signed)
Medication changes as above Will aim for rate control for his atrial flutter for symptoms

## 2015-09-17 NOTE — Assessment & Plan Note (Addendum)
Encouraged him to stay on his Lipitor   Total encounter time more than 25 minutes  Greater than 50% was spent in counseling and coordination of care with the patient

## 2015-09-17 NOTE — Telephone Encounter (Signed)
Pt called upset that he was seen in ED and that since being seen there that his BP and HR is jumping up and down not sure what to do.  Would like a call back. Pt is on waitlist to see Dr Rockey Situ only wants to see him.  Pt hung up once I asked if I could take a message.  He was upset and got loud. I stated to patient the nurses are in clinic that I could send them this message he said someone better call soon or he was not sure he would be alive really loudly.  Please call.

## 2015-09-17 NOTE — Patient Instructions (Addendum)
  Please hold the amlodipine Start diltiazem one pill once a day  Please increase metoprolol up to a whole pill twice a day (25 mg twice a day)  Please take amiodarone two pills twice a day for 5 days  then down to 1 pill twice a day  Please take lasix/furosemide  as needed for stomach bloating, shortness of breath  Please call us if you have new issues that need to be addressed before your next appt.  Your physician wants you to follow-up in: 1 week

## 2015-09-17 NOTE — Assessment & Plan Note (Signed)
Shortness of breath likely secondary to COPD, atrial flutter with poor rate control, possible acute diastolic CHF. We have provided him with some Lasix to take as needed for worsening abdominal bloating or shortness of breath

## 2015-09-17 NOTE — Telephone Encounter (Signed)
Spoke w/ pt.  He reports that his BP & HR have been very erratic. His HR ranges from 60s-140s. He is concerned and asks to see Dr. Rockey Situ for eval. He is taking his amiodarone as prescribed which seems to help, but is still having breakthrough afib.  Pt added on to see Dr. Rockey Situ today @ 3:40.

## 2015-09-18 ENCOUNTER — Telehealth: Payer: Self-pay | Admitting: Cardiovascular Disease

## 2015-09-18 NOTE — Telephone Encounter (Signed)
Left message w/ scheduling to see if we can get pt set up for DCCV on Thursday morning.

## 2015-09-18 NOTE — Telephone Encounter (Signed)
Pt wife calling stating pt BP is getting low but for them is getting too low  09/17/15  11:26pm 112/65 HR 85 3:22AM 105/58 HR 74 09/18/15 Took new medication at 7:22am 135/69 HR 70 8:38am 80/45/ HR  45  Pt and wife are concerned please call back.

## 2015-09-18 NOTE — Telephone Encounter (Signed)
Spoke w/ pt's wife.  Advised her that I spoke w/ Dr. Rockey Situ and he recommends that pt hold diltiazem this am, he can take it tonight. Advised him that Dr. Rockey Situ is concerned that readings are incorrect, as pt is in atrial flutter and the machine is unable to p/u all of his beats. Pt's wife reports that pt is constantly checking his BP, every 2-20 minutes. Advised against this and went over the reasons against checking so often.   Advised her that pt is sched for DCCV on Tuesday, March 7 @ 7:30. She verbalizes understanding to have pt arrive @ the [facility] @ 6:30am. Stressed the importance of med compliance and to ensure that pt does not miss any doses of Eliquis. She states that pt never misses a dose of any of his meds.  Reviewed pre-procedure instructions and she verbalizes understanding.  She states that pt's daughter is very concerned and keeps wanting to call 911 b/c pt c/o weakness. Advised her to have pt eat and drink something, as this will help if pt's BP is low. She states that pt does not want to eat, but she has given him bacon, eggs and juice which is reluctantly eating. Advised her to continue to monitor and call back if sx do not improve.  She is appreciative of the call and will pt take it easy today.

## 2015-09-19 ENCOUNTER — Telehealth: Payer: Self-pay | Admitting: Cardiovascular Disease

## 2015-09-19 NOTE — Telephone Encounter (Signed)
Pt sched for 11:00 tomorrow am.

## 2015-09-19 NOTE — Telephone Encounter (Addendum)
Pt sched for DCCV next Tuesday, March 7. Sched to see Dr. Rockey Situ tomorrow @ 3:40.

## 2015-09-19 NOTE — Telephone Encounter (Signed)
Pt wife calling, states pt is feeling better, heart is still "fluttering", BP and HR are still fluctuating. Last night. 94/46, 48 HR. 7:28 am this morning 129/60, 60 HR

## 2015-09-20 ENCOUNTER — Encounter: Payer: Self-pay | Admitting: Cardiovascular Disease

## 2015-09-20 ENCOUNTER — Ambulatory Visit (INDEPENDENT_AMBULATORY_CARE_PROVIDER_SITE_OTHER): Payer: Medicare HMO | Admitting: Cardiovascular Disease

## 2015-09-20 VITALS — BP 118/72 | HR 40 | Ht 69.0 in | Wt 231.8 lb

## 2015-09-20 DIAGNOSIS — I10 Essential (primary) hypertension: Secondary | ICD-10-CM

## 2015-09-20 DIAGNOSIS — I498 Other specified cardiac arrhythmias: Secondary | ICD-10-CM

## 2015-09-20 DIAGNOSIS — I483 Typical atrial flutter: Secondary | ICD-10-CM | POA: Diagnosis not present

## 2015-09-20 DIAGNOSIS — E7800 Pure hypercholesterolemia, unspecified: Secondary | ICD-10-CM

## 2015-09-20 DIAGNOSIS — I25701 Atherosclerosis of coronary artery bypass graft(s), unspecified, with angina pectoris with documented spasm: Secondary | ICD-10-CM

## 2015-09-20 DIAGNOSIS — I5032 Chronic diastolic (congestive) heart failure: Secondary | ICD-10-CM | POA: Insufficient documentation

## 2015-09-20 DIAGNOSIS — R079 Chest pain, unspecified: Secondary | ICD-10-CM

## 2015-09-20 DIAGNOSIS — R001 Bradycardia, unspecified: Secondary | ICD-10-CM

## 2015-09-20 NOTE — Assessment & Plan Note (Signed)
He is having some atypical type twinges in the right side of his chest We will back down on the medications, see if this helps improve his symptoms. Reports he had a great day yesterday with no symptoms

## 2015-09-20 NOTE — Assessment & Plan Note (Signed)
He has converted to normal sinus rhythm, bradycardic Recommended he decrease the amiodarone down to 200 mg twice a day Continue metoprolol 25 mg twice a day Stop the diltiazem Monitor his blood pressure, call us with blood pressure and pulse rate numbers tomorrow/Friday and on Monday

## 2015-09-20 NOTE — Progress Notes (Signed)
Patient ID: [name], male    DOB: 02/13/1946, 70 y.o.   MRN: [id number]  HPI Comments: [name] is a 70 year old gentleman with a history of smoking, coronary artery disease, cabg, Last catheterization March 2011 with occluded vein graft 2, patent LIMA, collaterals from left to right, atrial fibrillation March 2013, who presents for follow-up of his coronary artery disease and atrial fibrillation. 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 Converted back to normal with metoprolol. Possible conversion pause.  On his last clinic visit, he had converted to atrial fibrillation/flutter starting September 12 2015, then persistent flutter In the clinic his heart rate was 126 bpm and he was symptomatic Amiodarone dosing was increased, amlodipine was changed to diltiazem 120 mg daily, metoprolol increased up to 25 mill grams twice a day  In today's visit, he reports that he felt well yesterday, does not feel as well today EKG showing sinus bradycardia with rate 41 bpm, no significant ST or T-wave changes He is uncertain when he converted to normal sinus rhythm, possibly this morning when he woke up He has been taking Lasix in the past several days with improvement of his shortness of breath Family is concerned about twinges that he is having in his chest which she reported having today, on the right side  Other past medical history reviewed  tachycardia concerning for atrial fibrillation 05/10/2015. Lasted only several minutes Rate possibly up to 140 bpm.  Previously has been very active, building a barn, tool shed, taking care of animals  some benefits from the [place] for agent orange exposure and his cancer   total cholesterol 137, LDL 86, earlier in the year  chronic leg pain since he was young.  Previous leg pain with Crestor   admitted to the hospital on September 26 2011 for malaise, appearing pale, irregular heart rhythm and noted to be  in atrial fibrillation. He was given medication for rhythm control and he converted to normal sinus rhythm later that evening. No longer on anticoagulation. He has been maintaining normal sinus rhythm No symptoms concerning for atrial fibrillation since that time  Echocardiogram showed ejection fraction 35-45%, mildly elevated right ventricular systolic pressures estimated at 30-40 mm mercury.  Previous catheterization showing occluded LAD in the mid vessel, 60% left main, 99% distal RCA who was sent [facility] in [facility] 2010 for bypass surgery. He received 3 vessel bypass by Dr.Gerhardt with a LIMA to the LAD, vein graft to the OM1 and vein graft to the PDA, with subsequent cardiac catheter March 2011 for chest discomfort showing occluded vein grafts x2 with patent LIMA. He has collateral vessels from left to right. Ejection fraction 55% mild anterolateral hypokinesis.  No Known Allergies  Current Outpatient Prescriptions on File Prior to Visit  Medication Sig Dispense Refill  . amiodarone (PACERONE) 200 MG tablet Take 1 tablet (200 mg total) by mouth 2 (two) times daily as needed (for A-fib). 60 tablet 3  . apixaban (ELIQUIS) 5 MG TABS tablet Take 1 tablet (5 mg total) by mouth 2 (two) times daily. 60 tablet 6  . atorvastatin (LIPITOR) 40 MG tablet Take 40 mg by mouth at bedtime.    . budesonide-formoterol (SYMBICORT) 160-4.5 MCG/ACT inhaler Inhale 2 puffs into the lungs 2 (two) times daily.    . Coenzyme Q10 (CO Q-10) 100 MG CAPS Take 100 mg by mouth at bedtime.     . furosemide (LASIX) 20 MG  tablet Take 1 tablet (20 mg total) by mouth 2 (two) times daily as needed. 60 tablet 3  . metoprolol tartrate (LOPRESSOR) 25 MG tablet Take 1 tablet (25 mg total) by mouth 2 (two) times daily. 60 tablet 3  . Multiple Vitamin (MULTIVITAMIN WITH MINERALS) TABS tablet Take 1 tablet by mouth daily.    . nitroGLYCERIN (NITROSTAT) 0.4 MG SL tablet Place 0.4 mg under the tongue every 5 (five) minutes as  needed for chest pain.     Marland Kitchen tiotropium (SPIRIVA) 18 MCG inhalation capsule Place 18 mcg into inhaler and inhale at bedtime.    . traMADol (ULTRAM) 50 MG tablet Take 1 tablet (50 mg total) by mouth every 6 (six) hours as needed. (Patient taking differently: Take 50 mg by mouth every 6 (six) hours as needed for moderate pain. ) 90 tablet 0  . [DISCONTINUED] isosorbide mononitrate (IMDUR) 30 MG 24 hr tablet Take 1 tablet (30 mg total) by mouth daily. 30 tablet 6   No current facility-administered medications on file prior to visit.    Past Medical History  Diagnosis Date  . Coronary artery disease     a. 04/2009 CABG x 3 (LIMA->LAD, VG->OM1, VG->PDA);  b. 09/2009 Cath: occluded VG x 2 w/ patent LIMA and L->R collats. EF 55%, mild antlat HK;  c. 10/2011 MV: EF 53%, no isch/infarct-->low risk.  . Pure hypercholesterolemia   . Hypertension   . Atrial fibrillation ([place])     a. Dx 2013, recurred 02/2014, CHA2DS2VASc = 3 -->placed on Eliquis;  b. 02/2014 Echo: EF 50-55%, mid and apical anterior septum and mid and apical inf septum are abnl, mild to mod Ao sclerosis w/o AS.  Marland Kitchen History of arthritis   . Arthritis   . Chicken pox   . Rheumatic fever   . COPD (chronic obstructive pulmonary disease) ([place])   . Arthritis   . Chronic lymphocytic leukemia ([place])     a. Dx 02/2014.  Marland Kitchen OSA (obstructive sleep apnea)     a. On CPAP    Past Surgical History  Procedure Laterality Date  . Cardiac catheterization  2011    Dr Fletcher Anon  . Cholecystectomy    . Appendectomy  1988  . Tonsillectomy and adenoidectomy  1956  . Coronary artery bypass graft  2010    3v  . Hernia repair      abdominal  . Hernia repair      Social History  reports that he quit smoking about 9 years ago. His smoking use included Cigarettes. He has a 40 pack-year smoking history. He has never used smokeless tobacco. He reports that he does not drink alcohol or use illicit drugs.  Family History family history includes Heart disease in  his mother.   Review of Systems  Constitutional: Positive for fatigue.  Eyes: Negative.   Gastrointestinal: Negative.   Endocrine: Negative.   Musculoskeletal: Positive for arthralgias.  Neurological: Negative.   Psychiatric/Behavioral: Negative.   All other systems reviewed and are negative.  BP 118/72 mmHg  Pulse 40  Ht 5\' 9"  (1.753 m)  Wt 231 lb 12 oz (105.121 kg)  BMI 34.21 kg/m2  Physical Exam  Constitutional: He is oriented to person, place, and time. He appears well-developed and well-nourished.  HENT:  Head: Normocephalic.  Nose: Nose normal.  Mouth/Throat: Oropharynx is clear and moist.  Eyes: Conjunctivae are normal. Pupils are equal, round, and reactive to light.  Neck: Normal range of motion. Neck supple. No JVD present.  Cardiovascular: Regular  rhythm, S1 normal, S2 normal, normal heart sounds and intact distal pulses.  Tachycardia present.  Exam reveals no gallop and no friction rub.   No murmur heard. Pulmonary/Chest: Effort normal. No respiratory distress. He has no wheezes. He has no rales. He exhibits no tenderness.  Abdominal: Soft. Bowel sounds are normal. He exhibits no distension. There is no tenderness.  Musculoskeletal: Normal range of motion. He exhibits no edema or tenderness.  Lymphadenopathy:    He has no cervical adenopathy.  Neurological: He is alert and oriented to person, place, and time. Coordination normal.  Skin: Skin is warm and dry. No rash noted. No erythema.  Psychiatric: He has a normal mood and affect. His behavior is normal. Judgment and thought content normal.      Assessment and Plan   Nursing note and vitals reviewed.

## 2015-09-20 NOTE — Assessment & Plan Note (Signed)
Suspect we will need to restart amlodipine over the weekend or in the next several days Recommended day monitor blood pressure closely and call our office with numbers

## 2015-09-20 NOTE — Patient Instructions (Addendum)
You are doing well.  Please decrease the amiodarone down to 200 mg twice a day Stay on metoprolol one pill twice a day Stop the diltiazem for now  Take lasix daily with banana Take extra lasix for shortness of breath  Please call us if you have new issues that need to be addressed before your next appt.  Your physician wants you to follow-up in: 1 month.

## 2015-09-20 NOTE — Assessment & Plan Note (Signed)
Recommended that he continue to take one Lasix daily, then extra Lasix for any shortness of breath symptoms. Likely had CHF symptoms in the setting of atrial flutter with RVR

## 2015-09-20 NOTE — Assessment & Plan Note (Signed)
Encouraged him to stay on his Lipitor Goal LDL less than 70   Total encounter time more than 25 minutes  Greater than 50% was spent in counseling and coordination of care with the patient

## 2015-09-20 NOTE — Assessment & Plan Note (Signed)
He is prone to bradycardia We'll decrease his medications as above, monitor his heart rate Likely component of sick sinus syndrome

## 2015-09-21 ENCOUNTER — Telehealth: Payer: Self-pay | Admitting: Cardiovascular Disease

## 2015-09-21 NOTE — Telephone Encounter (Signed)
Excellent blood pressure and heart rate numbers

## 2015-09-21 NOTE — Telephone Encounter (Signed)
Patient was told by Dr. Rockey Situ to call back today with update on condition.   Feeling much better and stronger today.   bp  Today(at Rest) at 7:15 am 133/70 hr 62  2:40 pm 114/60  hr 61

## 2015-09-25 ENCOUNTER — Ambulatory Visit: Admission: RE | Admit: 2015-09-25 | Payer: Medicare HMO | Source: Ambulatory Visit | Admitting: Cardiovascular Disease

## 2015-09-25 ENCOUNTER — Encounter: Admission: RE | Payer: Self-pay | Source: Ambulatory Visit

## 2015-09-25 SURGERY — CARDIOVERSION (CATH LAB)
Anesthesia: General

## 2015-09-27 ENCOUNTER — Encounter: Payer: Self-pay | Admitting: Internal Medicine

## 2015-09-27 ENCOUNTER — Ambulatory Visit (INDEPENDENT_AMBULATORY_CARE_PROVIDER_SITE_OTHER): Payer: Medicare HMO | Admitting: Internal Medicine

## 2015-09-27 VITALS — BP 126/70 | HR 45 | Temp 98.1°F | Ht 69.0 in | Wt 232.5 lb

## 2015-09-27 DIAGNOSIS — C9110 Chronic lymphocytic leukemia of B-cell type not having achieved remission: Secondary | ICD-10-CM | POA: Diagnosis not present

## 2015-09-27 DIAGNOSIS — M170 Bilateral primary osteoarthritis of knee: Secondary | ICD-10-CM | POA: Diagnosis not present

## 2015-09-27 DIAGNOSIS — I483 Typical atrial flutter: Secondary | ICD-10-CM | POA: Diagnosis not present

## 2015-09-27 NOTE — Assessment & Plan Note (Signed)
Bilateral knee pain, chronic, secondary to OA. Will set up evaluation with ortho. Question if he might be a good candidate for Synvisc versus knee replacement.

## 2015-09-27 NOTE — Assessment & Plan Note (Signed)
Reviewed ER and cardiology notes. Bradycardic on exam today, however feeling well. Amiodarone dose recently decreased. Will continue Amiodarone, Metoprolol. Continue Eliquis for anticoagulation. Follow up 3 months and prn.

## 2015-09-27 NOTE — Progress Notes (Signed)
Pre visit review using our clinic review tool, if applicable. No additional management support is needed unless otherwise documented below in the visit note. 

## 2015-09-27 NOTE — Progress Notes (Signed)
Subjective:    Patient ID: [name], male    DOB: 02/18/1946, 70 y.o.   MRN: [id number]  HPI  69YO male presents for follow up.  Recently seen by Dr. Caryl Bis for AFIB with RVR. Seen in ED, then by Dr. Rockey Situ. Increased Amiodarone and Metoprolol. Stopped Amlodipine and added Diltiazem. In follow up, HR was low, so decreased dose of Amiodarone. Stopped Diltiazem.  Feeling well. Denies fatigue. HR at home mostly 50-70s. No change in chronic dyspnea. No chest pain currently.  Has follow up CT CAP scheduled with oncologist.  Bilateral knee pain - Continues to have aching pain in both knees. Ongoing for years. Takes Tramadol occasionally for pain.   Wt Readings from Last 3 Encounters:  09/27/15 232 lb 8 oz (105.461 kg)  09/20/15 231 lb 12 oz (105.121 kg)  09/17/15 231 lb 4 oz (104.894 kg)   BP Readings from Last 3 Encounters:  09/27/15 126/70  09/20/15 118/72  09/17/15 130/90    Past Medical History  Diagnosis Date  . Coronary artery disease     a. 04/2009 CABG x 3 (LIMA->LAD, VG->OM1, VG->PDA);  b. 09/2009 Cath: occluded VG x 2 w/ patent LIMA and L->R collats. EF 55%, mild antlat HK;  c. 10/2011 MV: EF 53%, no isch/infarct-->low risk.  . Pure hypercholesterolemia   . Hypertension   . Atrial fibrillation ([place])     a. Dx 2013, recurred 02/2014, CHA2DS2VASc = 3 -->placed on Eliquis;  b. 02/2014 Echo: EF 50-55%, mid and apical anterior septum and mid and apical inf septum are abnl, mild to mod Ao sclerosis w/o AS.  Marland Kitchen History of arthritis   . Arthritis   . Chicken pox   . Rheumatic fever   . COPD (chronic obstructive pulmonary disease) ([place])   . Arthritis   . Chronic lymphocytic leukemia ([place])     a. Dx 02/2014.  Marland Kitchen OSA (obstructive sleep apnea)     a. On CPAP   Family History  Problem Relation Age of Onset  . Coronary artery disease      family history  . Heart disease Mother    Past Surgical History  Procedure Laterality Date  . Cardiac catheterization  2011    Dr Fletcher Anon  . Cholecystectomy    . Appendectomy  1988  . Tonsillectomy and adenoidectomy  1956  . Coronary artery bypass graft  2010    3v  . Hernia repair      abdominal  . Hernia repair     Social History   Social History  . Marital Status: Married    Spouse Name: N/A  . Number of Children: N/A  . Years of Education: N/A   Occupational History  . retired     Research officer, trade union   Social History Main Topics  . Smoking status: Former Smoker -- 1.00 packs/day for 40 years    Types: Cigarettes    Quit date: 07/21/2006  . Smokeless tobacco: Never Used  . Alcohol Use: No     Comment: occasionally  . Drug Use: No  . Sexual Activity: Not Asked   Other Topics Concern  . None   Social History Narrative   Lives in [place] with wife. Dogs. Goats. Children - 4. Grandchildren - 7.      Worked - Sports coach, part time.   Diet - healthy   Exercise - very active      Service - [employer] - [place]    Review of Systems  Constitutional: Negative  for fever, chills, activity change, appetite change, fatigue and unexpected weight change.  Eyes: Negative for visual disturbance.  Respiratory: Negative for cough and shortness of breath.   Cardiovascular: Negative for chest pain, palpitations and leg swelling.  Gastrointestinal: Negative for nausea, vomiting, abdominal pain, diarrhea, constipation and abdominal distention.  Genitourinary: Negative for dysuria, urgency and difficulty urinating.  Musculoskeletal: Positive for arthralgias. Negative for myalgias and gait problem.  Skin: Negative for color change and rash.  Hematological: Negative for adenopathy.  Psychiatric/Behavioral: Negative for sleep disturbance and dysphoric mood. The patient is not nervous/anxious.        Objective:    BP 126/70 mmHg  Pulse 45  Temp(Src) 98.1 F (36.7 C) (Oral)  Ht 5\' 9"  (1.753 m)  Wt 232 lb 8 oz (105.461 kg)  BMI 34.32 kg/m2  SpO2 97% Physical Exam  Constitutional: He is  oriented to person, place, and time. He appears well-developed and well-nourished. No distress.  HENT:  Head: Normocephalic and atraumatic.  Right Ear: External ear normal.  Left Ear: External ear normal.  Nose: Nose normal.  Mouth/Throat: Oropharynx is clear and moist. No oropharyngeal exudate.  Eyes: Conjunctivae and EOM are normal. Pupils are equal, round, and reactive to light. Right eye exhibits no discharge. Left eye exhibits no discharge. No scleral icterus.  Neck: Normal range of motion. Neck supple. No tracheal deviation present. No thyromegaly present.  Cardiovascular: Regular rhythm and normal heart sounds.  Bradycardia present.  Exam reveals no gallop and no friction rub.   No murmur heard. Pulmonary/Chest: Effort normal and breath sounds normal. No accessory muscle usage. No tachypnea. No respiratory distress. He has no decreased breath sounds. He has no wheezes. He has no rhonchi. He has no rales. He exhibits no tenderness.  Musculoskeletal: Normal range of motion. He exhibits no edema.  Lymphadenopathy:    He has no cervical adenopathy.  Neurological: He is alert and oriented to person, place, and time. No cranial nerve deficit. Coordination normal.  Skin: Skin is warm and dry. No rash noted. He is not diaphoretic. No erythema. No pallor.  Psychiatric: He has a normal mood and affect. His behavior is normal. Judgment and thought content normal.          Assessment & Plan:   Problem List Items Addressed This Visit      Unprioritized   Atrial flutter ([place]) - Primary    Reviewed ER and cardiology notes. Bradycardic on exam today, however feeling well. Amiodarone dose recently decreased. Will continue Amiodarone, Metoprolol. Continue Eliquis for anticoagulation. Follow up 3 months and prn.      Chronic lymphatic leukemia.  Stage 2 given leukocytosis, lymphadenopathy and splenomegaly. Sept 2015 - Rapidly progressive bulky lymphadenopathy. Oct 2015 - Positive for ZAP-70 and  CD38.    Follow up CT CAP scheduled. Will follow      Osteoarthritis of both knees    Bilateral knee pain, chronic, secondary to OA. Will set up evaluation with ortho. Question if he might be a good candidate for Synvisc versus knee replacement.      Relevant Orders   Ambulatory referral to Orthopedic Surgery       Return in about 3 months (around 12/28/2015) for Recheck.  Ronette Deter, MD Internal Medicine [facility]

## 2015-09-27 NOTE — Patient Instructions (Signed)
Continue current medications.  We have placed a referral for evaluation with Dr. Marry Guan.  Follow up in 3 months.

## 2015-09-27 NOTE — Assessment & Plan Note (Signed)
Follow up CT CAP scheduled. Will follow

## 2015-09-28 ENCOUNTER — Ambulatory Visit
Admission: RE | Admit: 2015-09-28 | Discharge: 2015-09-28 | Disposition: A | Discharge status: Home / Self Care | Payer: Medicare HMO | Source: Ambulatory Visit | Attending: Internal Medicine | Admitting: Internal Medicine

## 2015-09-28 DIAGNOSIS — Z856 Personal history of leukemia: Secondary | ICD-10-CM | POA: Diagnosis not present

## 2015-09-28 DIAGNOSIS — C9110 Chronic lymphocytic leukemia of B-cell type not having achieved remission: Secondary | ICD-10-CM

## 2015-09-28 DIAGNOSIS — J341 Cyst and mucocele of nose and nasal sinus: Secondary | ICD-10-CM | POA: Diagnosis not present

## 2015-09-28 DIAGNOSIS — I6523 Occlusion and stenosis of bilateral carotid arteries: Secondary | ICD-10-CM | POA: Diagnosis not present

## 2015-09-28 DIAGNOSIS — M170 Bilateral primary osteoarthritis of knee: Secondary | ICD-10-CM

## 2015-09-28 DIAGNOSIS — M47812 Spondylosis without myelopathy or radiculopathy, cervical region: Secondary | ICD-10-CM | POA: Diagnosis not present

## 2015-09-28 IMAGING — CT CT CHEST W/ CM
1 of 3 series · 13 of 32 positions shown, 18 images · IV contrast (omnipaque)
Comparison: Neck CT, dictated separately. Chest radiograph of
[DATE]. Prior CTs of [DATE].

CLINICAL DATA: Chronic lymphocytic leukemia.

EXAM:
CT CHEST, ABDOMEN, AND PELVIS WITH CONTRAST
TECHNIQUE: Multidetector CT imaging of the chest, abdomen and pelvis was
performed following the standard protocol during bolus
administration of intravenous contrast.
CONTRAST:  100mL OMNIPAQUE IOHEXOL 350 MG/ML SOLN

[Series 2: cap with · axial · 0.80mm/px · z∈[-1010,-416]mm · 13 of 134 slices shown, 18 images]
[im 8/134  soft-tissue]
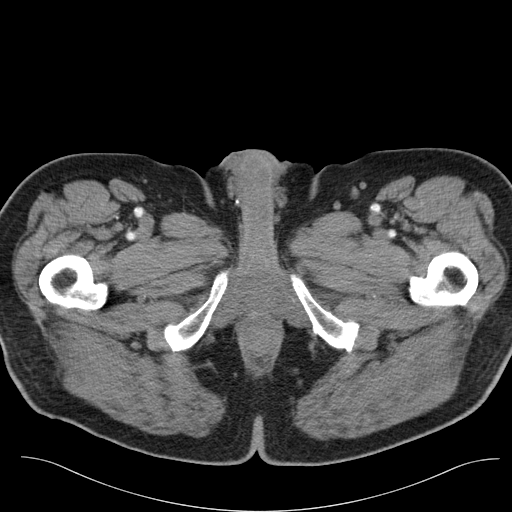
[im 8/134  bone]
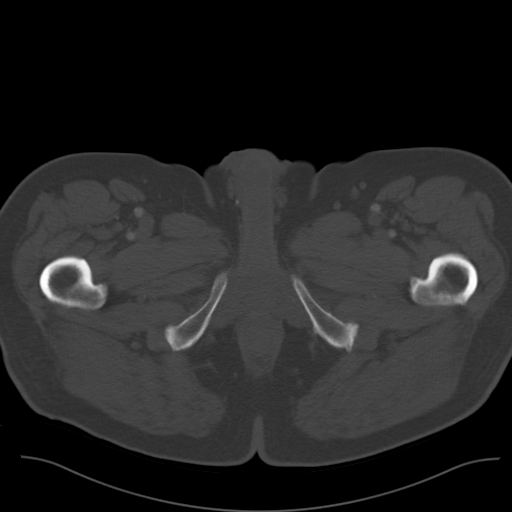
[im 22/134  soft-tissue]
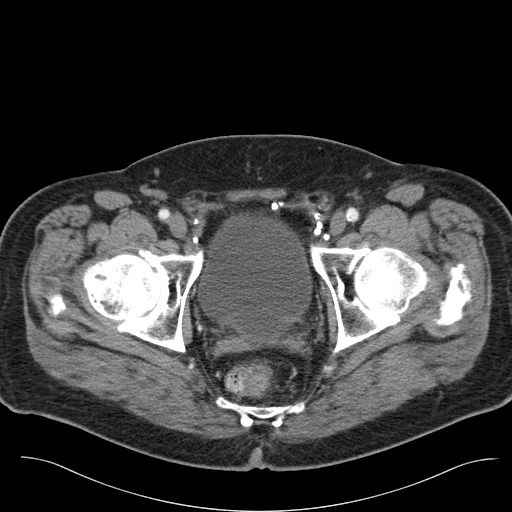
[im 29/134  soft-tissue]
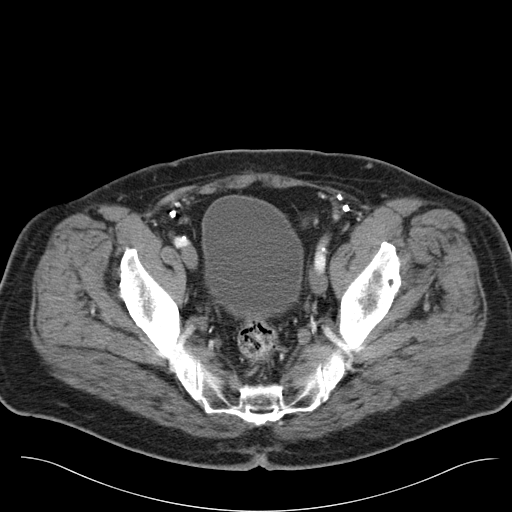
[im 43/134  soft-tissue]
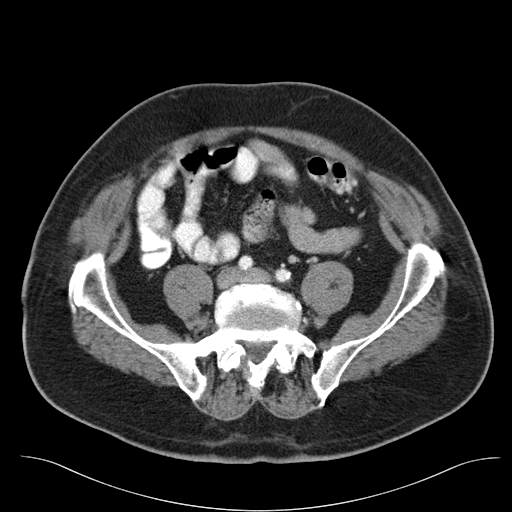
[im 50/134  soft-tissue]
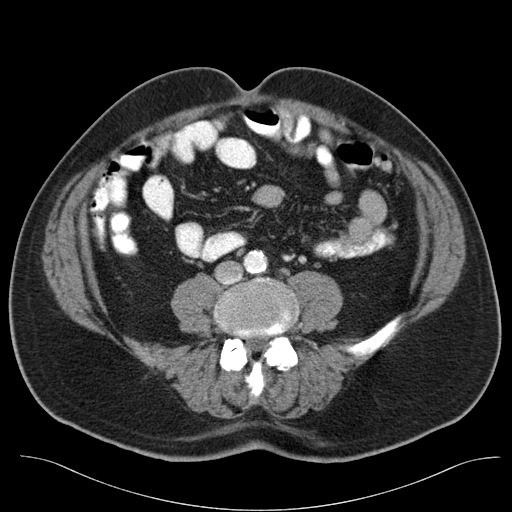
[im 64/134  soft-tissue]
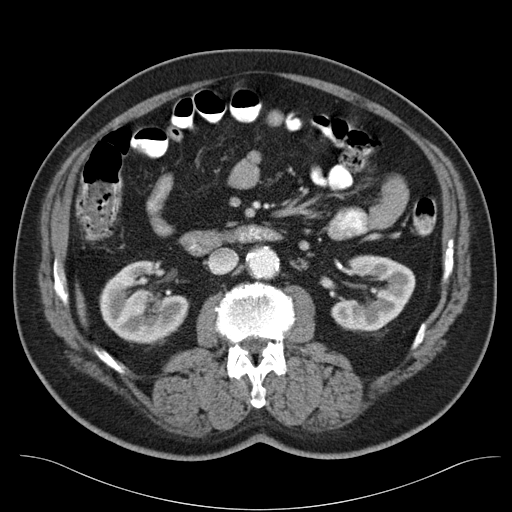
[im 71/134  soft-tissue]
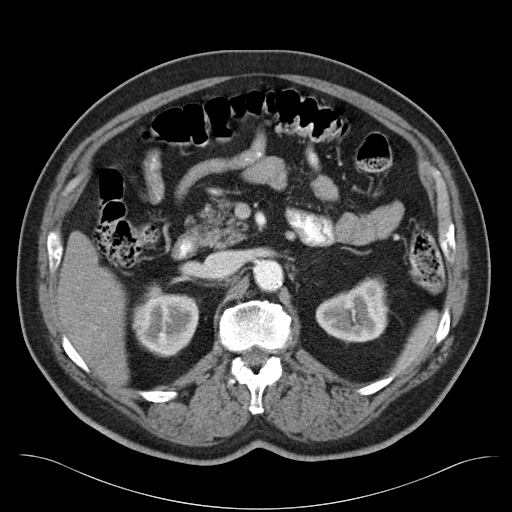
[im 85/134  soft-tissue]
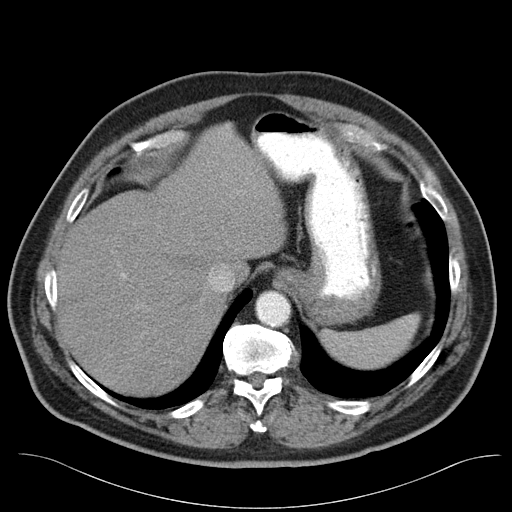
[im 92/134  soft-tissue]
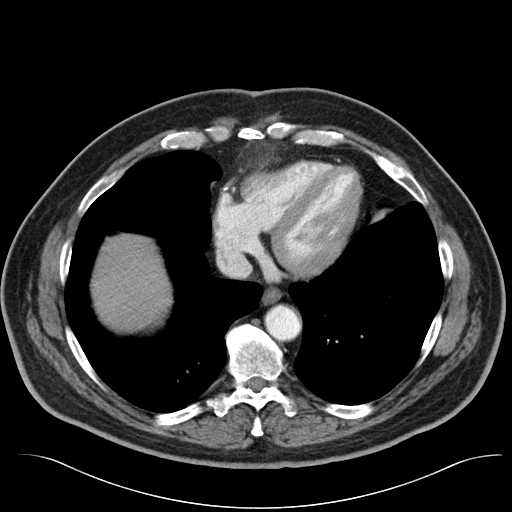
[im 92/134  bone]
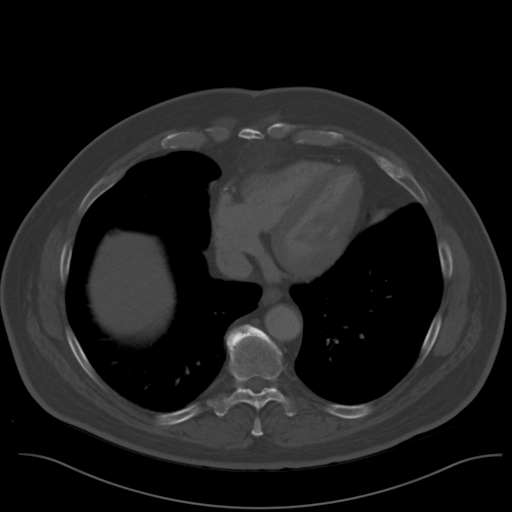
[im 106/134  soft-tissue]
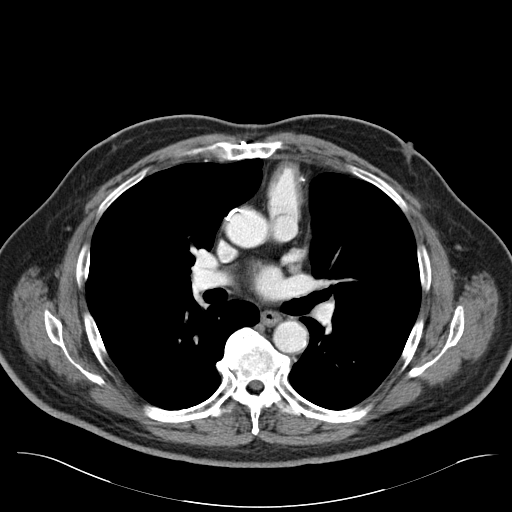
[im 106/134  lung]
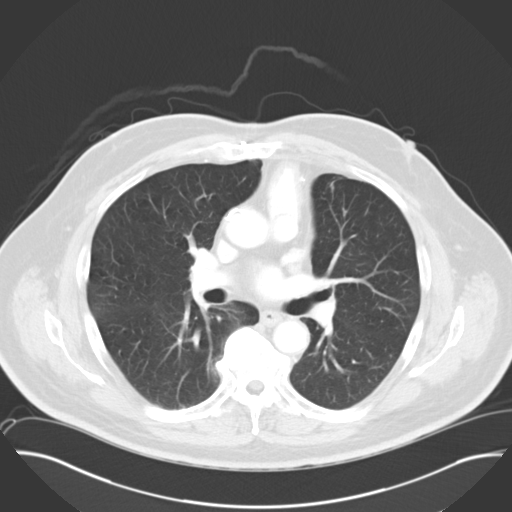
[im 113/134  soft-tissue]
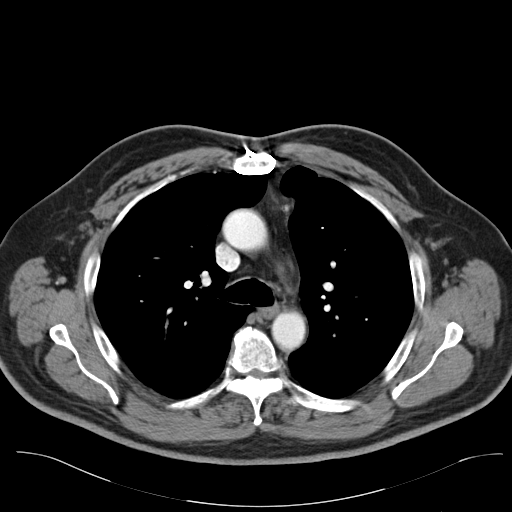
[im 113/134  lung]
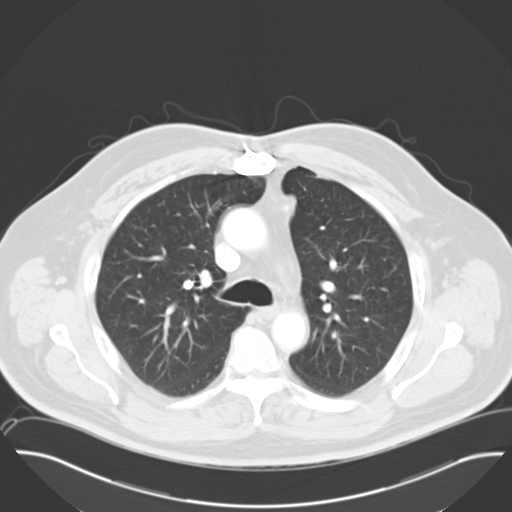
[im 120/134  lung]
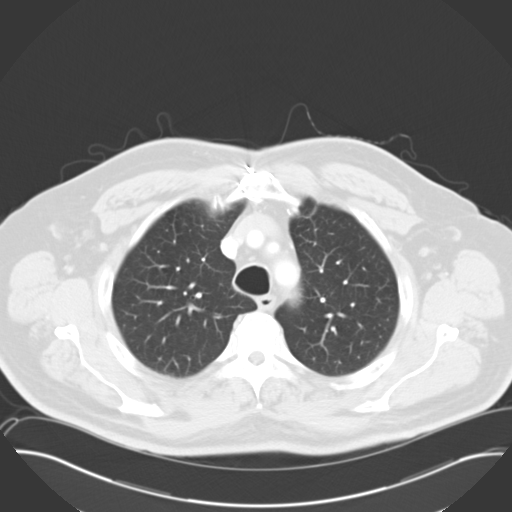
[im 127/134  soft-tissue]
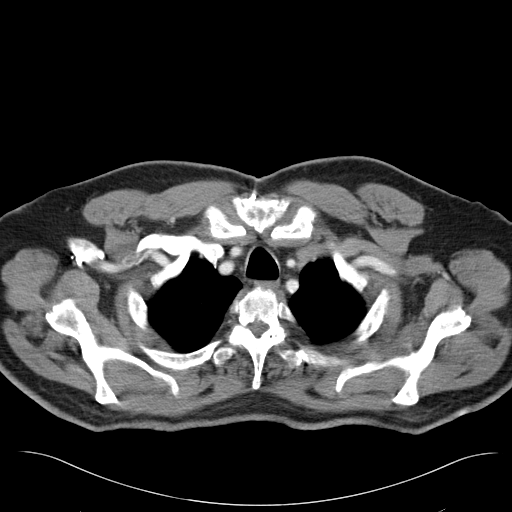
[im 127/134  lung]
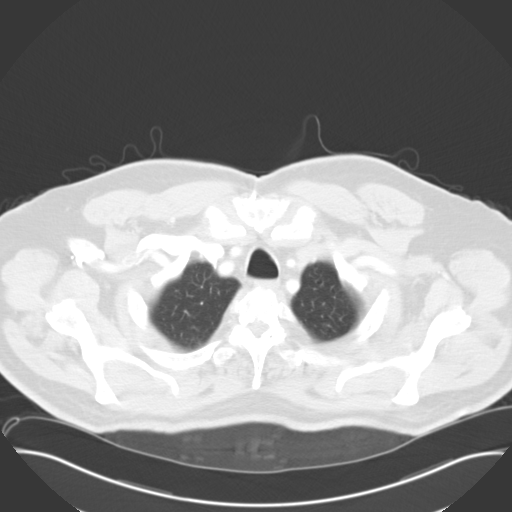

[13 of 32 positions shown; findings below may reference images not displayed]

FINDINGS: CT CHEST FINDINGS

Mediastinum/Lymph Nodes: Aortic and branch vessel atherosclerosis.
Normal heart size, without pericardial effusion. Lipomatous
hypertrophy of the interatrial septum. Prior median sternotomy. No
mediastinal or hilar adenopathy. Resolution of left axillary
adenopathy.

Lungs/Pleura: No pleural fluid.  Clear lungs.

Musculoskeletal: No acute osseous abnormality.

CT ABDOMEN PELVIS FINDINGS

Hepatobiliary: Normal liver. Cholecystectomy, without biliary ductal
dilatation.

Pancreas: Normal, without mass or ductal dilatation.

Spleen: Normal in size, without focal abnormality.

Adrenals/Urinary Tract: Minimal right adrenal nodularity is similar
to on the prior exam. Normal left adrenal. An interpolar right renal
sinus cyst. Normal left kidney. Normal urinary bladder.

Stomach/Bowel: Normal stomach, without wall thickening. Normal colon
and terminal ileum. Normal small bowel.

Vascular/Lymphatic: Multiple right renal arteries. Aortic and branch
vessel atherosclerosis. Small retroperitoneal nodes are again
identified. A left periaortic index node is borderline enlarged, 10
mm on image 72/ series 2. Compare 13 mm on the prior. No pelvic
adenopathy.

Reproductive: Moderate prostatomegaly.

Other: No significant free fluid. Bilateral inguinal hernia repairs.

Musculoskeletal: Right worse than left hip osteoarthritis. Partial
degenerative fusion of the right sacroiliac joint.
IMPRESSION: CT CHEST IMPRESSION

1. No evidence of thoracic adenopathy. Resolution of left axillary
adenopathy.
2. Advanced atherosclerosis.

CT ABDOMEN AND PELVIS IMPRESSION

1. Improvement to resolution in abdominal adenopathy.
2.  No acute process in the abdomen or pelvis.

## 2015-09-28 IMAGING — CT CT NECK W/ CM
2 of 3 series · 8 of 14 positions shown, 10 images · IV contrast (omnipaque)
Comparison: None.

CLINICAL DATA: Personal history of CLL treated with chemotherapy.
Followup.

EXAM:
CT NECK WITH CONTRAST
TECHNIQUE: Multidetector CT imaging of the neck was performed using the
standard protocol following the bolus administration of intravenous
contrast.
CONTRAST:  100mL OMNIPAQUE IOHEXOL 350 MG/ML SOLN

[Series 2: axial neck · axial · 0.67mm/px · z∈[-420,-288]mm · 3 of 132 slices shown]
[im 33/132  bone]
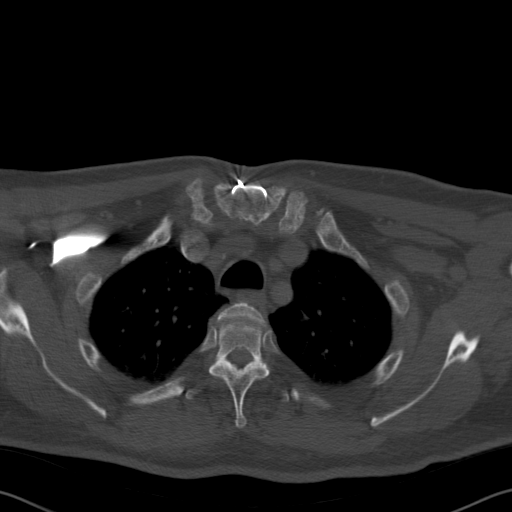
[im 66/132  bone]
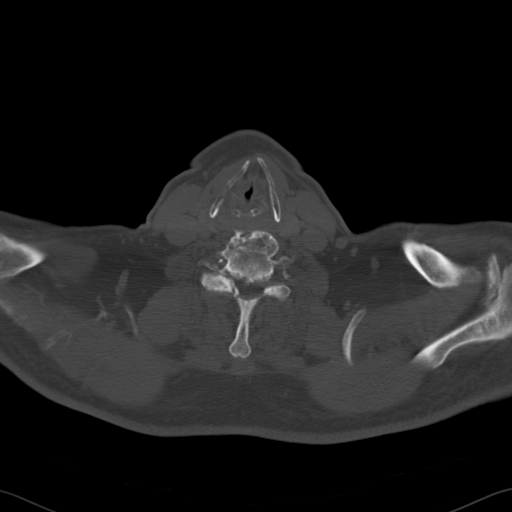
[im 99/132  bone]
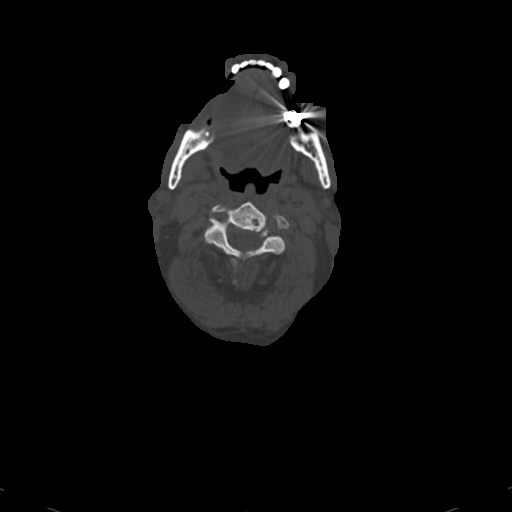

[Series 7: ax oropharynx · axial · 0.48mm/px · z∈[-537,-314]mm · 5 of 204 slices shown, 7 images]
[im 34/204  soft-tissue]
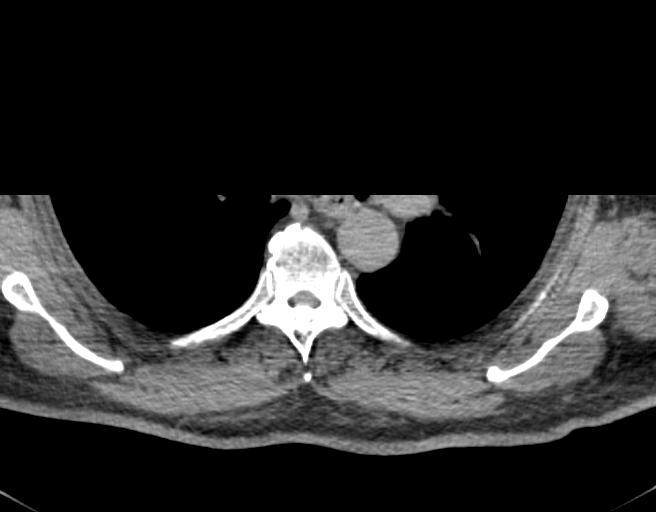
[im 34/204  bone]
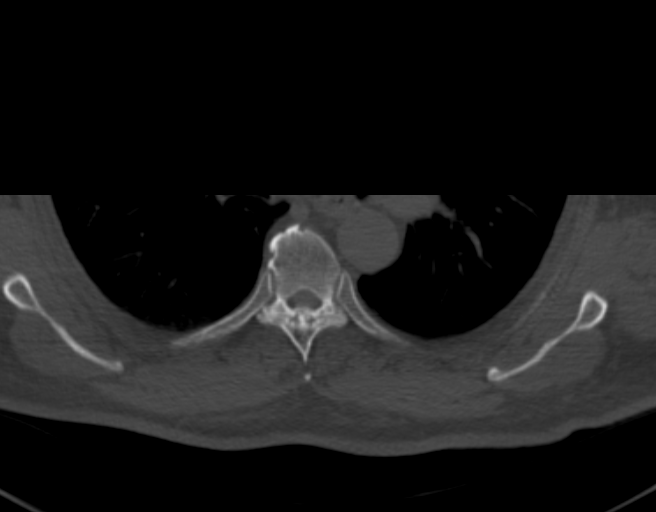
[im 68/204  bone]
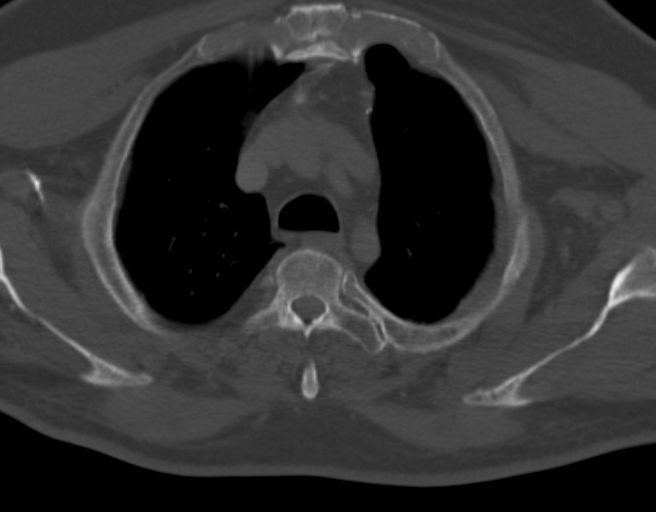
[im 102/204  bone]
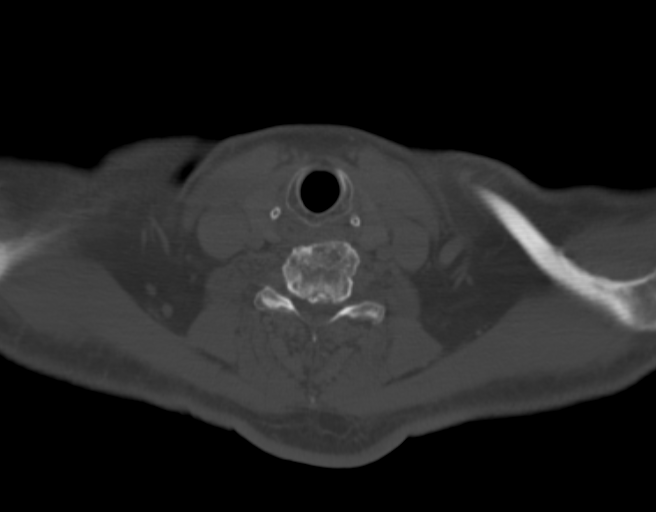
[im 136/204  bone]
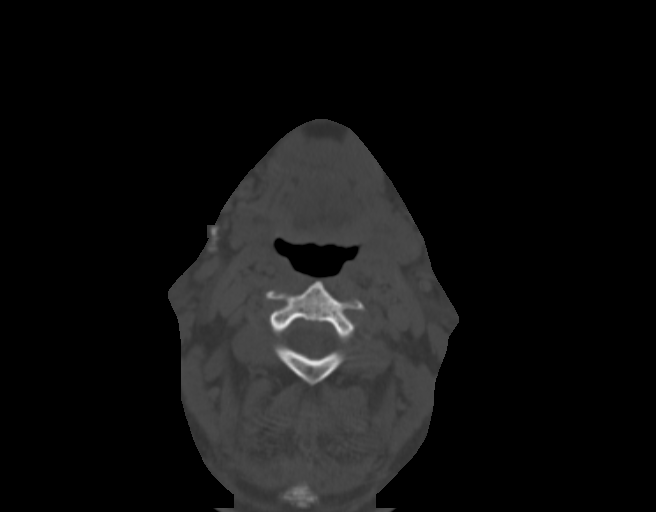
[im 170/204  soft-tissue]
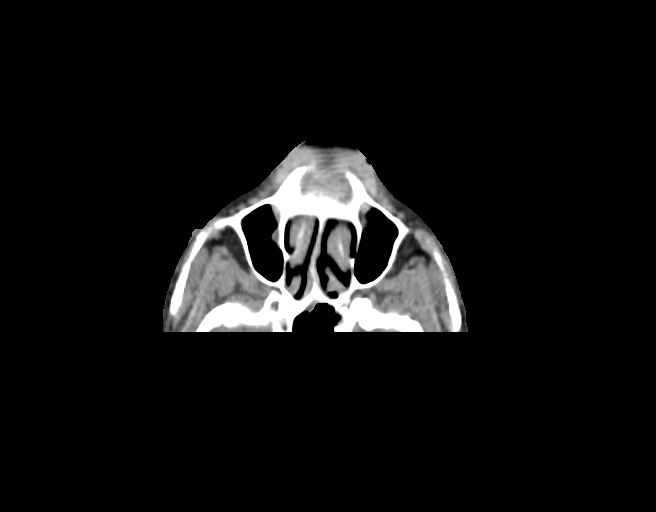
[im 170/204  bone]
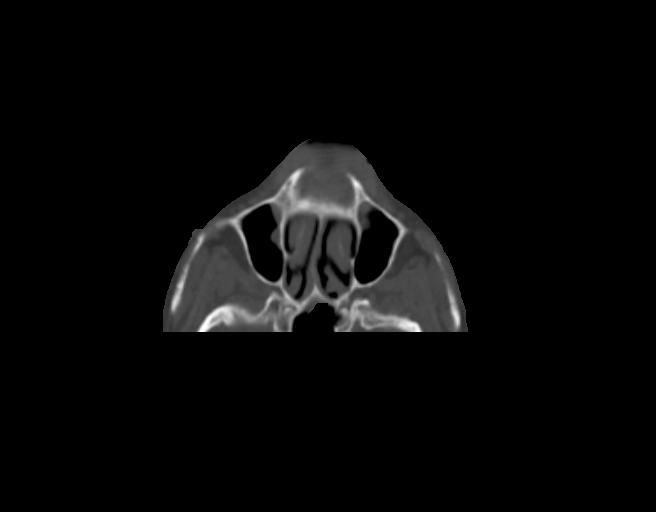

[8 of 14 positions shown; findings below may reference images not displayed]

FINDINGS: Pharynx and larynx: No mucosal or submucosal lesion.

Salivary glands: Submandibular and parotid glands are normal.

Thyroid: Normal

Lymph nodes: No enlarged or low-density nodes on either side of the
neck.

Vascular: Atherosclerosis of both carotid bifurcation regions. No
other vascular finding.

Limited intracranial: Normal

Visualized orbits: Normal

Mastoids and visualized paranasal sinuses: Retention cysts and/or
polyps in the maxillary sinuses. No fluid levels.

Skeleton: Ordinary cervical spondylosis
IMPRESSION: No mass or lymphadenopathy in the neck.

## 2015-09-28 MED ORDER — IOHEXOL 350 MG/ML SOLN
100.0000 mL | Freq: Once | INTRAVENOUS | Status: AC | PRN
  Administered 2015-09-28: 100 mL via INTRAVENOUS

## 2015-10-01 ENCOUNTER — Encounter: Payer: Self-pay | Admitting: Internal Medicine

## 2015-10-01 ENCOUNTER — Inpatient Hospital Stay: Payer: Medicare HMO

## 2015-10-01 ENCOUNTER — Inpatient Hospital Stay: Payer: Medicare HMO | Attending: Internal Medicine | Admitting: Internal Medicine

## 2015-10-01 ENCOUNTER — Other Ambulatory Visit: Payer: Self-pay | Admitting: *Deleted

## 2015-10-01 VITALS — BP 136/78 | HR 41 | Temp 97.7°F | Resp 18 | Ht 69.0 in | Wt 229.5 lb

## 2015-10-01 DIAGNOSIS — Z7901 Long term (current) use of anticoagulants: Secondary | ICD-10-CM | POA: Diagnosis not present

## 2015-10-01 DIAGNOSIS — G4730 Sleep apnea, unspecified: Secondary | ICD-10-CM | POA: Diagnosis not present

## 2015-10-01 DIAGNOSIS — J449 Chronic obstructive pulmonary disease, unspecified: Secondary | ICD-10-CM | POA: Insufficient documentation

## 2015-10-01 DIAGNOSIS — C9110 Chronic lymphocytic leukemia of B-cell type not having achieved remission: Secondary | ICD-10-CM | POA: Diagnosis not present

## 2015-10-01 DIAGNOSIS — Z79899 Other long term (current) drug therapy: Secondary | ICD-10-CM

## 2015-10-01 DIAGNOSIS — M129 Arthropathy, unspecified: Secondary | ICD-10-CM | POA: Diagnosis not present

## 2015-10-01 DIAGNOSIS — Z87891 Personal history of nicotine dependence: Secondary | ICD-10-CM | POA: Insufficient documentation

## 2015-10-01 DIAGNOSIS — I2510 Atherosclerotic heart disease of native coronary artery without angina pectoris: Secondary | ICD-10-CM | POA: Insufficient documentation

## 2015-10-01 DIAGNOSIS — E7800 Pure hypercholesterolemia, unspecified: Secondary | ICD-10-CM

## 2015-10-01 DIAGNOSIS — I4891 Unspecified atrial fibrillation: Secondary | ICD-10-CM | POA: Diagnosis not present

## 2015-10-01 DIAGNOSIS — I10 Essential (primary) hypertension: Secondary | ICD-10-CM | POA: Diagnosis not present

## 2015-10-01 LAB — CBC WITH DIFFERENTIAL/PLATELET
BASOS PCT: 1 %
Basophils Absolute: 0.1 10*3/uL (ref 0–0.1)
EOS ABS: 0.2 10*3/uL (ref 0–0.7)
Eosinophils Relative: 3 %
HEMATOCRIT: 39.7 % — AB (ref 40.0–52.0)
Hemoglobin: 14.1 g/dL (ref 13.0–18.0)
LYMPHS ABS: 1.2 10*3/uL (ref 1.0–3.6)
Lymphocytes Relative: 16 %
MCH: 32.9 pg (ref 26.0–34.0)
MCHC: 35.6 g/dL (ref 32.0–36.0)
MCV: 92.5 fL (ref 80.0–100.0)
MONO ABS: 0.8 10*3/uL (ref 0.2–1.0)
MONOS PCT: 10 %
Neutro Abs: 5.1 10*3/uL (ref 1.4–6.5)
Neutrophils Relative %: 70 %
Platelets: 141 10*3/uL — ABNORMAL LOW (ref 150–440)
RBC: 4.29 MIL/uL — ABNORMAL LOW (ref 4.40–5.90)
RDW: 13.9 % (ref 11.5–14.5)
WBC: 7.3 10*3/uL (ref 3.8–10.6)

## 2015-10-01 LAB — COMPREHENSIVE METABOLIC PANEL
ALBUMIN: 4.2 g/dL (ref 3.5–5.0)
ALK PHOS: 69 U/L (ref 38–126)
ALT: 24 U/L (ref 17–63)
ANION GAP: 6 (ref 5–15)
AST: 18 U/L (ref 15–41)
BILIRUBIN TOTAL: 1.2 mg/dL (ref 0.3–1.2)
BUN: 17 mg/dL (ref 6–20)
CALCIUM: 8.7 mg/dL — AB (ref 8.9–10.3)
CO2: 26 mmol/L (ref 22–32)
Chloride: 107 mmol/L (ref 101–111)
Creatinine, Ser: 1.1 mg/dL (ref 0.61–1.24)
GFR calc non Af Amer: >60 mL/min (ref >60)
GLUCOSE: 136 mg/dL — AB (ref 65–99)
POTASSIUM: 3.8 mmol/L (ref 3.5–5.1)
SODIUM: 139 mmol/L (ref 135–145)
TOTAL PROTEIN: 7 g/dL (ref 6.5–8.1)

## 2015-10-01 NOTE — Progress Notes (Signed)
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  MARCH 2017- CT N/C/A/P- NED. 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  Patient's appetite is good. He denies any weight loss. Denies any profuse night sweats.  Denies any frequent infections. Denies any chest pain or cough.  REVIEW OF SYSTEMS:  A complete 10 point review of system is done which is negative except mentioned above/history of present illness.   PAST MEDICAL HISTORY :  Past Medical History  Diagnosis Date  . Coronary artery disease     a. 04/2009 CABG x 3 (LIMA->LAD, VG->OM1, VG->PDA);  b. 09/2009 Cath: occluded VG x 2 w/ patent LIMA and L->R collats. EF 55%, mild antlat HK;  c. 10/2011 MV: EF 53%, no isch/infarct-->low risk.  . Pure hypercholesterolemia   . Hypertension   . Atrial fibrillation ([place])     a. Dx 2013, recurred 02/2014, CHA2DS2VASc = 3 -->placed on Eliquis;  b. 02/2014 Echo: EF 50-55%, mid and apical anterior septum and mid and apical inf septum are abnl, mild to mod Ao sclerosis w/o AS.  Marland Kitchen History of arthritis   . Arthritis   . Chicken pox   . Rheumatic fever   . COPD (chronic obstructive pulmonary disease) ([place])   . Arthritis   . Chronic lymphocytic leukemia ([place])     a. Dx 02/2014.  Marland Kitchen OSA (obstructive sleep apnea)     a. On CPAP    PAST SURGICAL HISTORY :   Past Surgical History  Procedure Laterality Date  . Cardiac catheterization  2011    Dr Fletcher Anon  . Cholecystectomy    .  Appendectomy  1988  . Tonsillectomy and adenoidectomy  1956  . Coronary artery bypass graft  2010    3v  . Hernia repair      abdominal  . Hernia repair      FAMILY HISTORY :   Family History  Problem Relation Age of Onset  . Coronary artery disease      family history  . Heart disease Mother     SOCIAL HISTORY:   Social History  Substance Use Topics  . Smoking status: Former Smoker -- 1.00 packs/day for 40 years    Types: Cigarettes    Quit date: 07/21/2006  . Smokeless tobacco: Never Used  . Alcohol Use: No     Comment: occasionally    ALLERGIES:  has No Known Allergies.  MEDICATIONS:  Current Outpatient Prescriptions  Medication Sig Dispense Refill  . amiodarone (PACERONE) 200 MG tablet Take 1 tablet (200 mg total) by mouth 2 (two) times daily as needed (for A-fib). 60 tablet 3  . apixaban (ELIQUIS) 5 MG TABS tablet Take 1 tablet (5 mg total) by mouth 2 (two) times daily. 60 tablet 6  . atorvastatin (LIPITOR) 40 MG tablet Take 40 mg by mouth at bedtime.    . budesonide-formoterol (SYMBICORT) 160-4.5 MCG/ACT inhaler Inhale 2 puffs into the lungs 2 (two) times daily.    . Coenzyme Q10 (CO Q-10) 100 MG  CAPS Take 100 mg by mouth at bedtime.     . furosemide (LASIX) 20 MG tablet Take 1 tablet (20 mg total) by mouth 2 (two) times daily as needed. 60 tablet 3  . metoprolol tartrate (LOPRESSOR) 25 MG tablet Take 1 tablet (25 mg total) by mouth 2 (two) times daily. 60 tablet 3  . Multiple Vitamin (MULTIVITAMIN WITH MINERALS) TABS tablet Take 1 tablet by mouth daily.    . nitroGLYCERIN (NITROSTAT) 0.4 MG SL tablet Place 0.4 mg under the tongue every 5 (five) minutes as needed for chest pain.     Marland Kitchen tiotropium (SPIRIVA) 18 MCG inhalation capsule Place 18 mcg into inhaler and inhale at bedtime.    . traMADol (ULTRAM) 50 MG tablet Take 1 tablet (50 mg total) by mouth every 6 (six) hours as needed. (Patient taking differently: Take 50 mg by mouth every 6 (six) hours as needed for  moderate pain. ) 90 tablet 0  . [DISCONTINUED] isosorbide mononitrate (IMDUR) 30 MG 24 hr tablet Take 1 tablet (30 mg total) by mouth daily. 30 tablet 6   No current facility-administered medications for this visit.    PHYSICAL EXAMINATION: ECOG PERFORMANCE STATUS: 0 - Asymptomatic  BP 136/78 mmHg  Pulse 41  Temp(Src) 97.7 F (36.5 C) (Tympanic)  Resp 18  Ht 5\' 9"  (1.753 m)  Wt 229 lb 8 oz (104.1 kg)  BMI 33.88 kg/m2  Filed Weights   10/01/15 0937  Weight: 229 lb 8 oz (104.1 kg)    GENERAL: Well-nourished well-developed; Alert, no distress and comfortable. He is alone.  EYES: no pallor or icterus OROPHARYNX: no thrush or ulceration; dentures.  NECK: supple, no masses felt LYMPH:  no palpable lymphadenopathy in the cervical, axillary or inguinal regions LUNGS: clear to auscultation and  No wheeze or crackles HEART/CVS: regular rate & rhythm and no murmurs; No lower extremity edema ABDOMEN:abdomen soft, non-tender and normal bowel sounds Musculoskeletal:no cyanosis of digits and no clubbing  PSYCH: alert & oriented x 3 with fluent speech NEURO: no focal motor/sensory deficits SKIN:  no rashes or significant lesions  LABORATORY DATA:  I have reviewed the data as listed    Component Value Date/Time   NA 139 10/01/2015 0914   NA 139 10/11/2014 1800   K 3.8 10/01/2015 0914   K 3.3* 10/11/2014 1800   CL 107 10/01/2015 0914   CL 106 10/11/2014 1800   CO2 26 10/01/2015 0914   CO2 27 10/11/2014 1800   GLUCOSE 136* 10/01/2015 0914   GLUCOSE 107* 10/11/2014 1800   BUN 17 10/01/2015 0914   BUN 15 10/11/2014 1800   CREATININE 1.10 10/01/2015 0914   CREATININE 0.89 10/11/2014 1800   CALCIUM 8.7* 10/01/2015 0914   CALCIUM 8.8* 10/11/2014 1800   PROT 7.0 10/01/2015 0914   PROT 6.4* 10/11/2014 1800   PROT 6.9 09/09/2013 1151   ALBUMIN 4.2 10/01/2015 0914   ALBUMIN 4.1 10/11/2014 1800   ALBUMIN 4.8 09/09/2013 1151   AST 18 10/01/2015 0914   AST 23 10/11/2014 1800   ALT 24  10/01/2015 0914   ALT 22 10/11/2014 1800   ALKPHOS 69 10/01/2015 0914   ALKPHOS 61 10/11/2014 1800   BILITOT 1.2 10/01/2015 0914   BILITOT 0.9 10/11/2014 1800   GFRNONAA >60 10/01/2015 0914   GFRNONAA >60 10/11/2014 1800   GFRNONAA >60 08/15/2014 1205   GFRAA >60 10/01/2015 0914   GFRAA >60 10/11/2014 1800   GFRAA >60 08/15/2014 1205    No results found for: SPEP,  UPEP  Lab Results  Component Value Date   WBC 7.3 10/01/2015   NEUTROABS 5.1 10/01/2015   HGB 14.1 10/01/2015   HCT 39.7* 10/01/2015   MCV 92.5 10/01/2015   PLT 141* 10/01/2015      Chemistry      Component Value Date/Time   NA 139 10/01/2015 0914   NA 139 10/11/2014 1800   K 3.8 10/01/2015 0914   K 3.3* 10/11/2014 1800   CL 107 10/01/2015 0914   CL 106 10/11/2014 1800   CO2 26 10/01/2015 0914   CO2 27 10/11/2014 1800   BUN 17 10/01/2015 0914   BUN 15 10/11/2014 1800   CREATININE 1.10 10/01/2015 0914   CREATININE 0.89 10/11/2014 1800      Component Value Date/Time   CALCIUM 8.7* 10/01/2015 0914   CALCIUM 8.8* 10/11/2014 1800   ALKPHOS 69 10/01/2015 0914   ALKPHOS 61 10/11/2014 1800   AST 18 10/01/2015 0914   AST 23 10/11/2014 1800   ALT 24 10/01/2015 0914   ALT 22 10/11/2014 1800   BILITOT 1.2 10/01/2015 0914   BILITOT 0.9 10/11/2014 1800       ASSESSMENT & PLAN:   # CLL/SLL- status post bendamustine rituximab 6 cycles [finished in April 2016]- currently on maintenance rituximab every 2 months. CT scan March 2017 shows- no evidence of any lymphadenopathy in the neck/chest /abdomen pelvis; no splenomegaly. CBC is within normal limits except for platelet count slightly low at 144.  Creatinine normal. I reviewed the images myself.  # I would not recommend any further maintenance rituximab at this time- especially if the CT of the chest abdomen pelvis is negative for any residual disease. Discussed the pros and cons of rituximab maintenance; I think risks of continued Rituxan out weight the  benefits.  # I would recommend follow-up in 2 months/CBC CMP and LDH.  # Patient was given a copy of the scans/ also for his [facility] records.  # 25 minutes face-to-face with the patient discussing the above plan of care; more than 50% of time spent on prognosis/ natural history; counseling and coordination.      Cammie Sickle, MD 10/01/2015 9:58 AM

## 2015-10-01 NOTE — Progress Notes (Signed)
Pt was in atrial fib a couple of weeks ago, was put on amiodarone and lasix.  Heart rate today 41.  Pt eating and drinking good.  Here to get scan results.  Pt has pain left side neck that has been bothering him for about 1 month.  His legs hurt all the time and he takes ultram for it. Bowels are good.

## 2015-10-02 ENCOUNTER — Telehealth: Payer: Self-pay | Admitting: Pulmonary Disease

## 2015-10-02 NOTE — Telephone Encounter (Signed)
Pt scheduled for [facility] at 1:00 today 10/02/15, however, pt went to [facility] of [facility]. Pt has been r/s for 10/11/15 at 1:00 at [facility] in [place]. Pt has been given the address and explained where [facility] in [place] was.  Pt will go for this appointment. Advised patient to ride by there today and locate [facility] and showed him what the building looks like and where to go. Nothing else needed at this time. Pt is aware of appointment date, time, location and phone number of [phone]. Nothing else needed at this time. Rhonda J Cobb

## 2015-10-02 NOTE — Telephone Encounter (Signed)
Patient says he has been waiting on cpap set up .  wants to talk to someone today.  Patient will come back to office at 130 to speak with rhonda about dme to [facility] cpap set up.

## 2015-10-03 ENCOUNTER — Telehealth: Payer: Self-pay | Admitting: *Deleted

## 2015-10-03 NOTE — Telephone Encounter (Signed)
Pt called and wants cd of his scan so he can take to [place].  The disc is ready and I told him he can come pick it up.

## 2015-10-11 DIAGNOSIS — G4733 Obstructive sleep apnea (adult) (pediatric): Secondary | ICD-10-CM | POA: Diagnosis not present

## 2015-10-24 ENCOUNTER — Encounter: Payer: Self-pay | Admitting: Cardiovascular Disease

## 2015-10-24 ENCOUNTER — Ambulatory Visit (INDEPENDENT_AMBULATORY_CARE_PROVIDER_SITE_OTHER): Payer: Medicare HMO | Admitting: Cardiovascular Disease

## 2015-10-24 VITALS — BP 138/80 | HR 37 | Ht 69.0 in | Wt 232.5 lb

## 2015-10-24 DIAGNOSIS — I25701 Atherosclerosis of coronary artery bypass graft(s), unspecified, with angina pectoris with documented spasm: Secondary | ICD-10-CM

## 2015-10-24 DIAGNOSIS — I4891 Unspecified atrial fibrillation: Secondary | ICD-10-CM

## 2015-10-24 DIAGNOSIS — I10 Essential (primary) hypertension: Secondary | ICD-10-CM

## 2015-10-24 DIAGNOSIS — I480 Paroxysmal atrial fibrillation: Secondary | ICD-10-CM

## 2015-10-24 MED ORDER — AMIODARONE HCL 200 MG PO TABS: 100.0000 mg | ORAL_TABLET | Freq: Two times a day (BID) | ORAL | Status: DC

## 2015-10-24 NOTE — Assessment & Plan Note (Signed)
He is concerned about high blood pressure when he first wakes up but blood pressure is well controlled rest of the day He does have PTSD, restless sleep Wears his CPAP.  no medication changes made at this time

## 2015-10-24 NOTE — Progress Notes (Signed)
Patient ID: [name], male    DOB: 05-31-1946, 70 y.o.   MRN: [id number]  HPI Comments: [name] is a 70 year old gentleman with a history of smoking, coronary artery disease, cabg, Last catheterization March 2011 with occluded vein graft 2, patent LIMA, collaterals from left to right, atrial fibrillation March 2013, who presents for follow-up of his coronary artery disease and atrial fibrillation. 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 Converted back to normal with metoprolol. Possible conversion pause.  In follow-up today, he reports feeling well, he is concerned about high blood pressure in the morning when he wakes up. Blood pressure 150, rarely 160, that is down to 130s after his morning medications. Denies any lightheadedness, near syncope, orthostasis. Reports heart rate typically high 40s and to 50 range at home Denies any tachycardia concerning for arrhythmia He is taking Lasix periodically for abdominal bloating, shortness of breath with improvement of his symptoms Continues to have problems with PTSD  EKG on today's visit shows sinus bradycardia with ventricular rate 37 bpm, no significant ST or T-wave changes  Other past medical history reviewed  atrial fibrillation/flutter starting September 12 2015, then persistent flutter In the clinic his heart rate was 126 bpm Amiodarone dosing was increased, amlodipine was changed to diltiazem 120 mg daily, metoprolol increased up to 25 mill grams twice a day Converted to normal sinus rhythm at home   tachycardia concerning for atrial fibrillation 05/10/2015. Lasted only several minutes Rate possibly up to 140 bpm.  Previously has been very active, building a barn, tool shed, taking care of animals  some benefits from the [place] for agent orange exposure and his cancer   total cholesterol 137, LDL 86, earlier in the year  chronic leg pain since he was young.  Previous leg pain  with Crestor   admitted to the hospital on September 26 2011 for malaise, appearing pale, irregular heart rhythm and noted to be in atrial fibrillation. He was given medication for rhythm control and he converted to normal sinus rhythm later that evening. No longer on anticoagulation. He has been maintaining normal sinus rhythm No symptoms concerning for atrial fibrillation since that time  Echocardiogram showed ejection fraction 35-45%, mildly elevated right ventricular systolic pressures estimated at 30-40 mm mercury.  Previous catheterization showing occluded LAD in the mid vessel, 60% left main, 99% distal RCA who was sent to [facility] in October 2010 for bypass surgery. He received 3 vessel bypass by Dr.Gerhardt with a LIMA to the LAD, vein graft to the OM1 and vein graft to the PDA, with subsequent cardiac catheter March 2011 for chest discomfort showing occluded vein grafts x2 with patent LIMA. He has collateral vessels from left to right. Ejection fraction 55% mild anterolateral hypokinesis.  No Known Allergies  Current Outpatient Prescriptions on File Prior to Visit  Medication Sig Dispense Refill  . apixaban (ELIQUIS) 5 MG TABS tablet Take 1 tablet (5 mg total) by mouth 2 (two) times daily. 60 tablet 6  . atorvastatin (LIPITOR) 40 MG tablet Take 40 mg by mouth at bedtime.    . budesonide-formoterol (SYMBICORT) 160-4.5 MCG/ACT inhaler Inhale 2 puffs into the lungs 2 (two) times daily.    . Coenzyme Q10 (CO Q-10) 100 MG CAPS Take 100 mg by mouth at bedtime.     . furosemide (LASIX) 20 MG tablet Take 1 tablet (20 mg total) by mouth 2 (two) times daily as needed. [place]  tablet 3  . metoprolol tartrate (LOPRESSOR) 25 MG tablet Take 1 tablet (25 mg total) by mouth 2 (two) times daily. 60 tablet 3  . Multiple Vitamin (MULTIVITAMIN WITH MINERALS) TABS tablet Take 1 tablet by mouth daily.    . nitroGLYCERIN (NITROSTAT) 0.4 MG SL tablet Place 0.4 mg under the tongue every 5 (five) minutes as needed for  chest pain.     Marland Kitchen tiotropium (SPIRIVA) 18 MCG inhalation capsule Place 18 mcg into inhaler and inhale at bedtime.    . traMADol (ULTRAM) 50 MG tablet Take 1 tablet (50 mg total) by mouth every 6 (six) hours as needed. (Patient taking differently: Take 50 mg by mouth every 6 (six) hours as needed for moderate pain. ) 90 tablet 0  . [DISCONTINUED] isosorbide mononitrate (IMDUR) 30 MG 24 hr tablet Take 1 tablet (30 mg total) by mouth daily. 30 tablet 6   No current facility-administered medications on file prior to visit.    Past Medical History  Diagnosis Date  . Coronary artery disease     a. 04/2009 CABG x 3 (LIMA->LAD, VG->OM1, VG->PDA);  b. 09/2009 Cath: occluded VG x 2 w/ patent LIMA and L->R collats. EF 55%, mild antlat HK;  c. 10/2011 MV: EF 53%, no isch/infarct-->low risk.  . Pure hypercholesterolemia   . Hypertension   . Atrial fibrillation ([place])     a. Dx 2013, recurred 02/2014, CHA2DS2VASc = 3 -->placed on Eliquis;  b. 02/2014 Echo: EF 50-55%, mid and apical anterior septum and mid and apical inf septum are abnl, mild to mod Ao sclerosis w/o AS.  Marland Kitchen History of arthritis   . Arthritis   . Chicken pox   . Rheumatic fever   . COPD (chronic obstructive pulmonary disease) ([place])   . Arthritis   . Chronic lymphocytic leukemia ([place])     a. Dx 02/2014.  Marland Kitchen OSA (obstructive sleep apnea)     a. On CPAP    Past Surgical History  Procedure Laterality Date  . Cardiac catheterization  2011    Dr Fletcher Anon  . Cholecystectomy    . Appendectomy  1988  . Tonsillectomy and adenoidectomy  1956  . Coronary artery bypass graft  2010    3v  . Hernia repair      abdominal  . Hernia repair      Social History  reports that he quit smoking about 9 years ago. His smoking use included Cigarettes. He has a 40 pack-year smoking history. He has never used smokeless tobacco. He reports that he does not drink alcohol or use illicit drugs.  Family History family history includes Heart disease in his  mother.   Review of Systems  Constitutional: Negative.   Eyes: Negative.   Respiratory: Negative.   Cardiovascular: Negative.   Gastrointestinal: Negative.   Endocrine: Negative.   Musculoskeletal: Positive for arthralgias.  Neurological: Negative.   Psychiatric/Behavioral: Negative.   All other systems reviewed and are negative.  BP 138/80 mmHg  Pulse 37  Ht 5\' 9"  (1.753 m)  Wt 232 lb 8 oz (105.461 kg)  BMI 34.32 kg/m2  Physical Exam  Constitutional: He is oriented to person, place, and time. He appears well-developed and well-nourished.  HENT:  Head: Normocephalic.  Nose: Nose normal.  Mouth/Throat: Oropharynx is clear and moist.  Eyes: Conjunctivae are normal. Pupils are equal, round, and reactive to light.  Neck: Normal range of motion. Neck supple. No JVD present.  Cardiovascular: Regular rhythm, S1 normal, S2 normal, normal heart sounds and  intact distal pulses.  Exam reveals no gallop and no friction rub.   No murmur heard. Pulmonary/Chest: Effort normal. No respiratory distress. He has no wheezes. He has no rales. He exhibits no tenderness.  Abdominal: Soft. Bowel sounds are normal. He exhibits no distension. There is no tenderness.  Musculoskeletal: Normal range of motion. He exhibits no edema or tenderness.  Lymphadenopathy:    He has no cervical adenopathy.  Neurological: He is alert and oriented to person, place, and time. Coordination normal.  Skin: Skin is warm and dry. No rash noted. No erythema.  Psychiatric: He has a normal mood and affect. His behavior is normal. Judgment and thought content normal.      Assessment and Plan   Nursing note and vitals reviewed.

## 2015-10-24 NOTE — Assessment & Plan Note (Signed)
Currently with no symptoms of angina. No further workup at this time. Continue current medication regimen. 

## 2015-10-24 NOTE — Patient Instructions (Addendum)
You are doing well.  Please decrease the amiodarone down to 1/2 pill twice a day  Please call with heart rate numbers  Please call us if you have new issues that need to be addressed before your next appt.  Your physician wants you to follow-up in: 6 months.  You will receive a reminder letter in the mail two months in advance. If you don't receive a letter, please call our office to schedule the follow-up appointment.

## 2015-10-24 NOTE — Assessment & Plan Note (Signed)
Maintaining normal sinus rhythm, recommended he decrease amiodarone down to 100 mg twice a day given his bradycardia We'll continue metoprolol for now Recommended he monitor heart rate at home and call our office with heart rate numbers in the next several weeks Currently asymptomatic

## 2015-10-29 ENCOUNTER — Ambulatory Visit: Payer: Medicare HMO | Admitting: Cardiovascular Disease

## 2015-11-02 ENCOUNTER — Encounter: Payer: Self-pay | Admitting: Emergency Medicine

## 2015-11-02 ENCOUNTER — Emergency Department: Payer: Medicare HMO

## 2015-11-02 ENCOUNTER — Observation Stay
Admission: EM | Admit: 2015-11-02 | Discharge: 2015-11-03 | Disposition: A | Discharge status: Home / Self Care | Payer: Medicare HMO | Attending: Internal Medicine | Admitting: Internal Medicine

## 2015-11-02 ENCOUNTER — Observation Stay (HOSPITAL_BASED_OUTPATIENT_CLINIC_OR_DEPARTMENT_OTHER)
Admit: 2015-11-02 | Discharge: 2015-11-02 | Disposition: A | Discharge status: Home / Self Care | Payer: Medicare HMO | Attending: Internal Medicine | Admitting: Internal Medicine

## 2015-11-02 DIAGNOSIS — I959 Hypotension, unspecified: Secondary | ICD-10-CM | POA: Insufficient documentation

## 2015-11-02 DIAGNOSIS — R42 Dizziness and giddiness: Secondary | ICD-10-CM | POA: Diagnosis not present

## 2015-11-02 DIAGNOSIS — Z79899 Other long term (current) drug therapy: Secondary | ICD-10-CM | POA: Insufficient documentation

## 2015-11-02 DIAGNOSIS — F4310 Post-traumatic stress disorder, unspecified: Secondary | ICD-10-CM | POA: Insufficient documentation

## 2015-11-02 DIAGNOSIS — D72829 Elevated white blood cell count, unspecified: Secondary | ICD-10-CM | POA: Insufficient documentation

## 2015-11-02 DIAGNOSIS — I4892 Unspecified atrial flutter: Secondary | ICD-10-CM | POA: Diagnosis not present

## 2015-11-02 DIAGNOSIS — Z7901 Long term (current) use of anticoagulants: Secondary | ICD-10-CM | POA: Diagnosis not present

## 2015-11-02 DIAGNOSIS — E7800 Pure hypercholesterolemia, unspecified: Secondary | ICD-10-CM | POA: Diagnosis not present

## 2015-11-02 DIAGNOSIS — R51 Headache: Secondary | ICD-10-CM | POA: Insufficient documentation

## 2015-11-02 DIAGNOSIS — M47812 Spondylosis without myelopathy or radiculopathy, cervical region: Secondary | ICD-10-CM | POA: Insufficient documentation

## 2015-11-02 DIAGNOSIS — Z951 Presence of aortocoronary bypass graft: Secondary | ICD-10-CM | POA: Insufficient documentation

## 2015-11-02 DIAGNOSIS — R410 Disorientation, unspecified: Secondary | ICD-10-CM | POA: Diagnosis not present

## 2015-11-02 DIAGNOSIS — R161 Splenomegaly, not elsewhere classified: Secondary | ICD-10-CM | POA: Diagnosis not present

## 2015-11-02 DIAGNOSIS — I480 Paroxysmal atrial fibrillation: Secondary | ICD-10-CM | POA: Insufficient documentation

## 2015-11-02 DIAGNOSIS — G459 Transient cerebral ischemic attack, unspecified: Secondary | ICD-10-CM

## 2015-11-02 DIAGNOSIS — R55 Syncope and collapse: Secondary | ICD-10-CM | POA: Diagnosis not present

## 2015-11-02 DIAGNOSIS — I5032 Chronic diastolic (congestive) heart failure: Secondary | ICD-10-CM | POA: Insufficient documentation

## 2015-11-02 DIAGNOSIS — G4733 Obstructive sleep apnea (adult) (pediatric): Secondary | ICD-10-CM | POA: Insufficient documentation

## 2015-11-02 DIAGNOSIS — I482 Chronic atrial fibrillation: Secondary | ICD-10-CM | POA: Diagnosis not present

## 2015-11-02 DIAGNOSIS — M1990 Unspecified osteoarthritis, unspecified site: Secondary | ICD-10-CM | POA: Diagnosis not present

## 2015-11-02 DIAGNOSIS — M4802 Spinal stenosis, cervical region: Secondary | ICD-10-CM | POA: Insufficient documentation

## 2015-11-02 DIAGNOSIS — I110 Hypertensive heart disease with heart failure: Secondary | ICD-10-CM | POA: Diagnosis not present

## 2015-11-02 DIAGNOSIS — M170 Bilateral primary osteoarthritis of knee: Secondary | ICD-10-CM | POA: Insufficient documentation

## 2015-11-02 DIAGNOSIS — Z87891 Personal history of nicotine dependence: Secondary | ICD-10-CM | POA: Insufficient documentation

## 2015-11-02 DIAGNOSIS — R001 Bradycardia, unspecified: Secondary | ICD-10-CM | POA: Insufficient documentation

## 2015-11-02 DIAGNOSIS — I160 Hypertensive urgency: Principal | ICD-10-CM | POA: Insufficient documentation

## 2015-11-02 DIAGNOSIS — R2990 Unspecified symptoms and signs involving the nervous system: Secondary | ICD-10-CM | POA: Diagnosis present

## 2015-11-02 DIAGNOSIS — I10 Essential (primary) hypertension: Secondary | ICD-10-CM | POA: Diagnosis not present

## 2015-11-02 DIAGNOSIS — I2510 Atherosclerotic heart disease of native coronary artery without angina pectoris: Secondary | ICD-10-CM | POA: Diagnosis not present

## 2015-11-02 DIAGNOSIS — I6502 Occlusion and stenosis of left vertebral artery: Secondary | ICD-10-CM | POA: Diagnosis not present

## 2015-11-02 DIAGNOSIS — G8929 Other chronic pain: Secondary | ICD-10-CM | POA: Insufficient documentation

## 2015-11-02 DIAGNOSIS — C9110 Chronic lymphocytic leukemia of B-cell type not having achieved remission: Secondary | ICD-10-CM | POA: Insufficient documentation

## 2015-11-02 DIAGNOSIS — J449 Chronic obstructive pulmonary disease, unspecified: Secondary | ICD-10-CM | POA: Insufficient documentation

## 2015-11-02 DIAGNOSIS — F05 Delirium due to known physiological condition: Secondary | ICD-10-CM | POA: Insufficient documentation

## 2015-11-02 DIAGNOSIS — M2578 Osteophyte, vertebrae: Secondary | ICD-10-CM | POA: Diagnosis not present

## 2015-11-02 DIAGNOSIS — I739 Peripheral vascular disease, unspecified: Secondary | ICD-10-CM | POA: Insufficient documentation

## 2015-11-02 DIAGNOSIS — I455 Other specified heart block: Secondary | ICD-10-CM | POA: Insufficient documentation

## 2015-11-02 DIAGNOSIS — R200 Anesthesia of skin: Secondary | ICD-10-CM | POA: Insufficient documentation

## 2015-11-02 DIAGNOSIS — I639 Cerebral infarction, unspecified: Secondary | ICD-10-CM

## 2015-11-02 DIAGNOSIS — Z87898 Personal history of other specified conditions: Secondary | ICD-10-CM | POA: Insufficient documentation

## 2015-11-02 DIAGNOSIS — R519 Headache, unspecified: Secondary | ICD-10-CM | POA: Insufficient documentation

## 2015-11-02 DIAGNOSIS — I25810 Atherosclerosis of coronary artery bypass graft(s) without angina pectoris: Secondary | ICD-10-CM | POA: Insufficient documentation

## 2015-11-02 DIAGNOSIS — J111 Influenza due to unidentified influenza virus with other respiratory manifestations: Secondary | ICD-10-CM | POA: Diagnosis not present

## 2015-11-02 DIAGNOSIS — Z9989 Dependence on other enabling machines and devices: Secondary | ICD-10-CM

## 2015-11-02 HISTORY — DX: Transient cerebral ischemic attack, unspecified: G45.9

## 2015-11-02 LAB — COMPREHENSIVE METABOLIC PANEL
ALBUMIN: 4.5 g/dL (ref 3.5–5.0)
ALK PHOS: 82 U/L (ref 38–126)
ALT: 28 U/L (ref 17–63)
ANION GAP: 4 — AB (ref 5–15)
AST: 21 U/L (ref 15–41)
BILIRUBIN TOTAL: 1.3 mg/dL — AB (ref 0.3–1.2)
BUN: 17 mg/dL (ref 6–20)
CALCIUM: 9.1 mg/dL (ref 8.9–10.3)
CO2: 27 mmol/L (ref 22–32)
Chloride: 108 mmol/L (ref 101–111)
Creatinine, Ser: 1.05 mg/dL (ref 0.61–1.24)
GFR calc Af Amer: >60 mL/min (ref >60)
GLUCOSE: 123 mg/dL — AB (ref 65–99)
Potassium: 4.2 mmol/L (ref 3.5–5.1)
Sodium: 139 mmol/L (ref 135–145)
TOTAL PROTEIN: 6.9 g/dL (ref 6.5–8.1)

## 2015-11-02 LAB — CBC
HEMATOCRIT: 38.8 % — AB (ref 40.0–52.0)
HEMOGLOBIN: 13.4 g/dL (ref 13.0–18.0)
MCH: 32.2 pg (ref 26.0–34.0)
MCHC: 34.4 g/dL (ref 32.0–36.0)
MCV: 93.5 fL (ref 80.0–100.0)
Platelets: 139 10*3/uL — ABNORMAL LOW (ref 150–440)
RBC: 4.15 MIL/uL — AB (ref 4.40–5.90)
RDW: 14.8 % — ABNORMAL HIGH (ref 11.5–14.5)
WBC: 7.7 10*3/uL (ref 3.8–10.6)

## 2015-11-02 LAB — DIFFERENTIAL
Basophils Absolute: 0.1 10*3/uL (ref 0–0.1)
Basophils Relative: 2 %
EOS ABS: 0.1 10*3/uL (ref 0–0.7)
EOS PCT: 2 %
LYMPHS ABS: 1.2 10*3/uL (ref 1.0–3.6)
LYMPHS PCT: 15 %
MONOS PCT: 11 %
Monocytes Absolute: 0.8 10*3/uL (ref 0.2–1.0)
NEUTROS PCT: 70 %
Neutro Abs: 5.4 10*3/uL (ref 1.4–6.5)

## 2015-11-02 LAB — PROTIME-INR
INR: 1.24
Prothrombin Time: 15.8 seconds — ABNORMAL HIGH (ref 11.4–15.0)

## 2015-11-02 LAB — GLUCOSE, CAPILLARY: Glucose-Capillary: 108 mg/dL — ABNORMAL HIGH (ref 65–99)

## 2015-11-02 LAB — TROPONIN I: Troponin I: <0.03 ng/mL (ref <0.031)

## 2015-11-02 LAB — APTT: aPTT: 30 seconds (ref 24–36)

## 2015-11-02 IMAGING — CT CT HEAD W/O CM
1 of 2 series · 16 of 30 positions shown, 20 images · non-contrast
Comparison: [DATE]

CLINICAL DATA: Increased confusion and headaches

EXAM:
CT HEAD WITHOUT CONTRAST
TECHNIQUE: Contiguous axial images were obtained from the base of the skull
through the vertex without intravenous contrast.

[Series 2: soft tissue · axial · 0.43mm/px · z∈[+358,+488]mm · 16 of 30 slices shown, 20 images]
[im 2/30  brain]
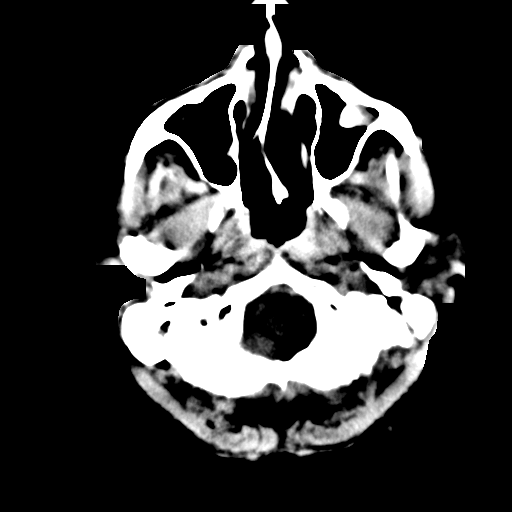
[im 2/30  bone]
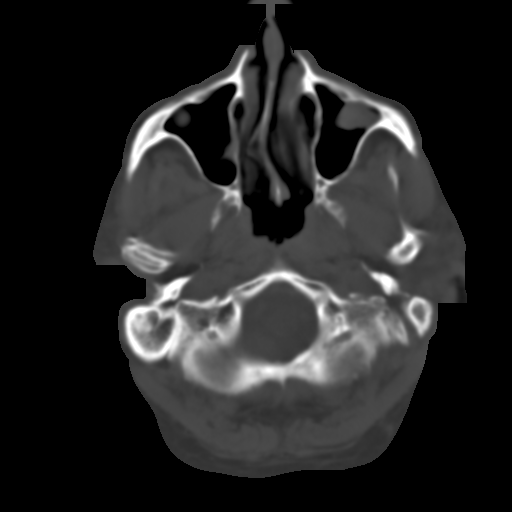
[im 3/30  brain]
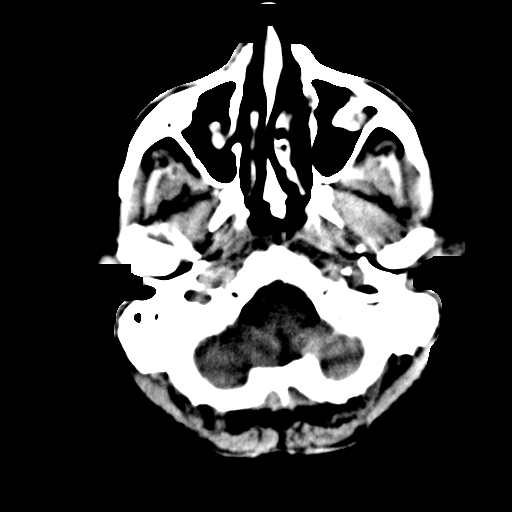
[im 5/30  brain]
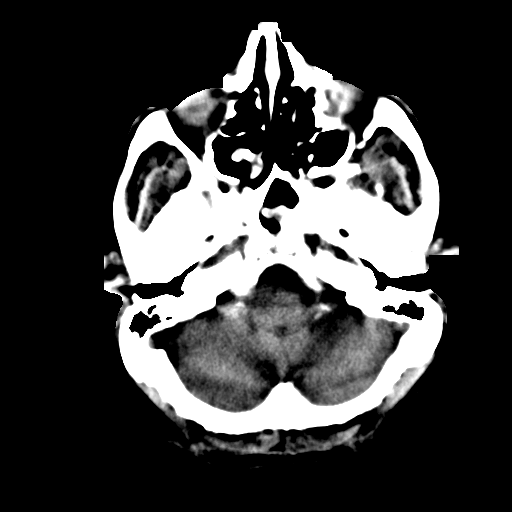
[im 8/30  brain]
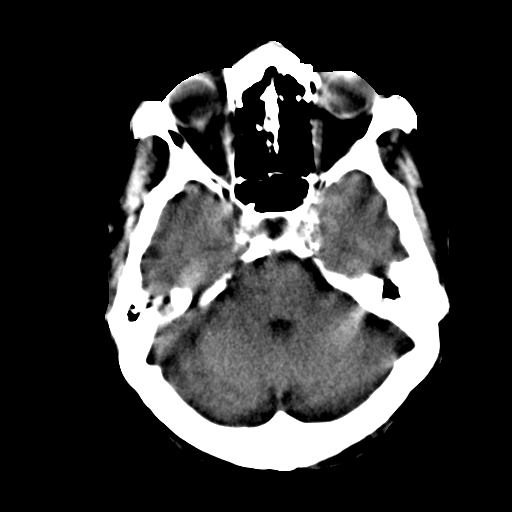
[im 9/30  brain]
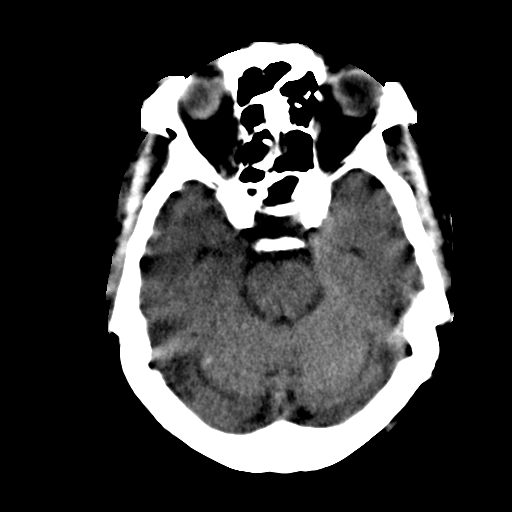
[im 9/30  bone]
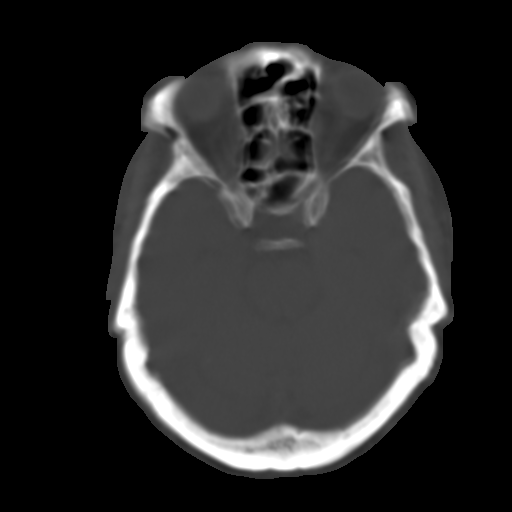
[im 11/30  brain]
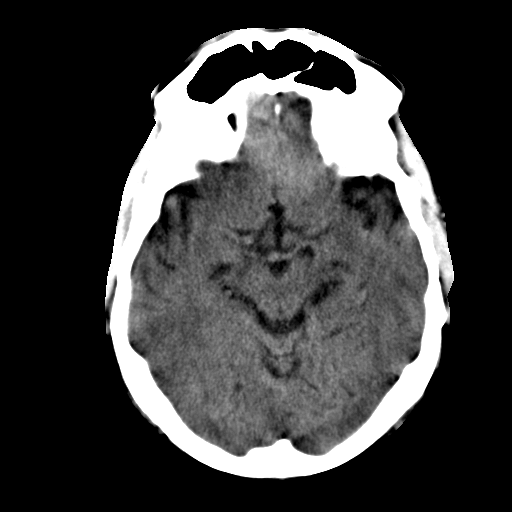
[im 12/30  brain]
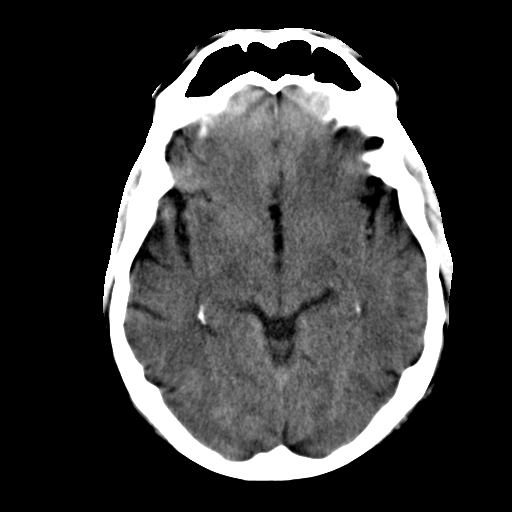
[im 14/30  brain]
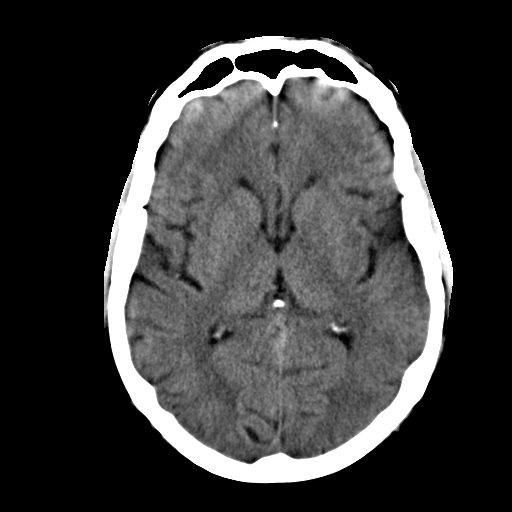
[im 16/30  brain]
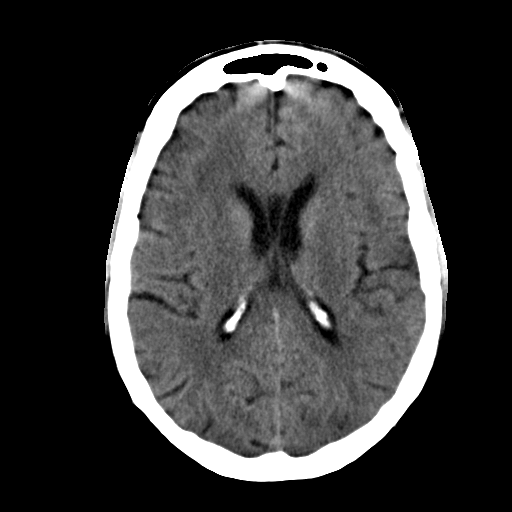
[im 16/30  bone]
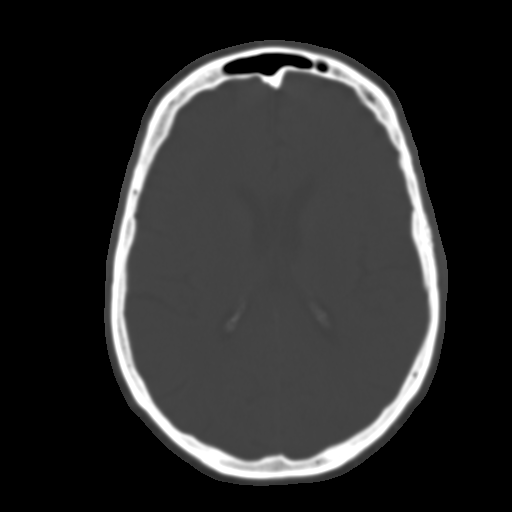
[im 18/30  brain]
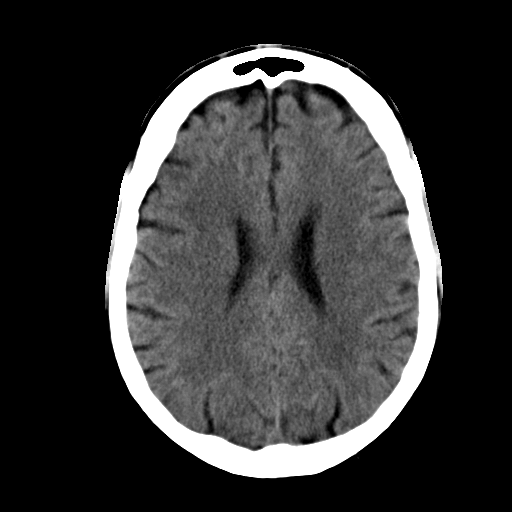
[im 19/30  brain]
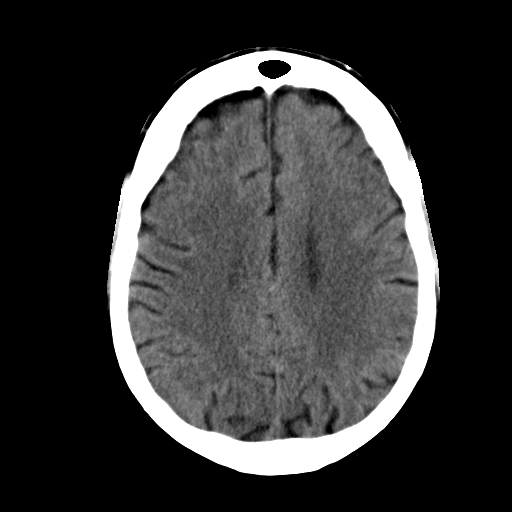
[im 21/30  brain]
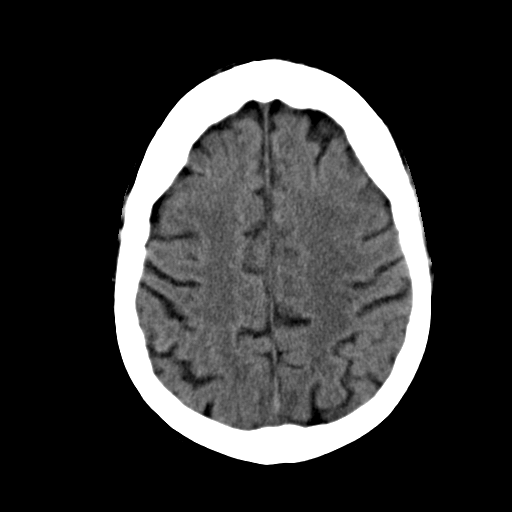
[im 22/30  brain]
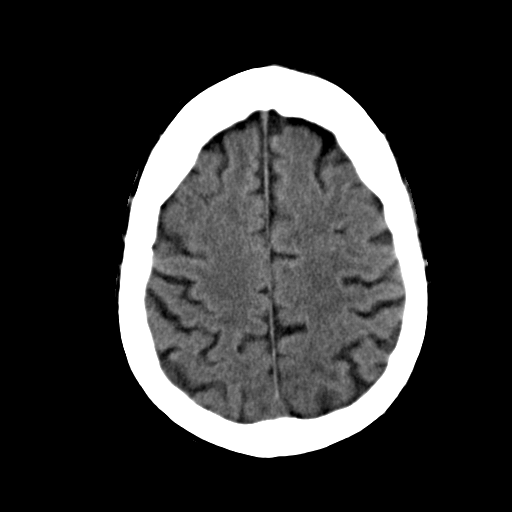
[im 22/30  bone]
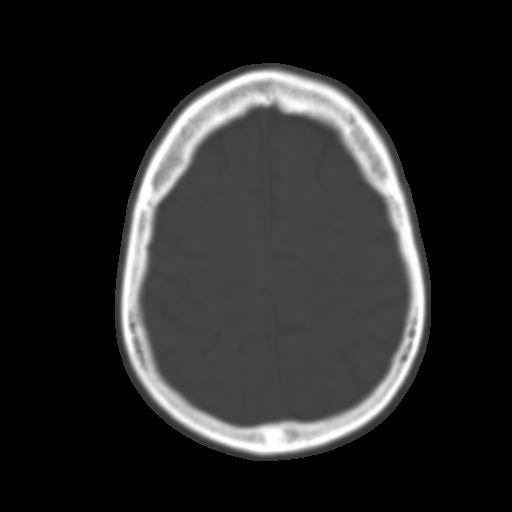
[im 25/30  brain]
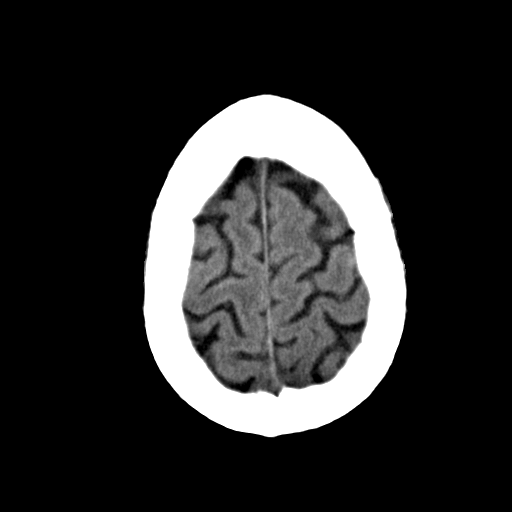
[im 27/30  brain]
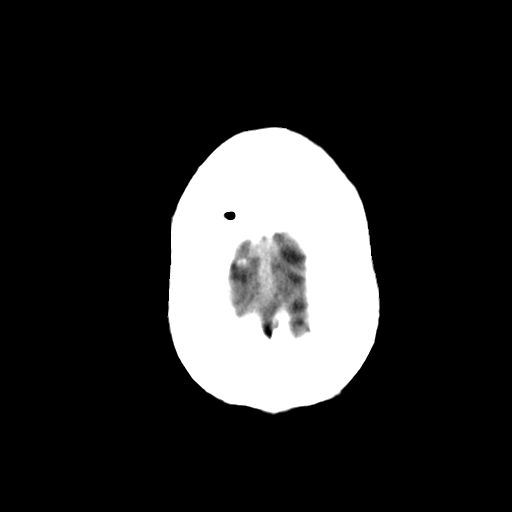
[im 28/30  brain]
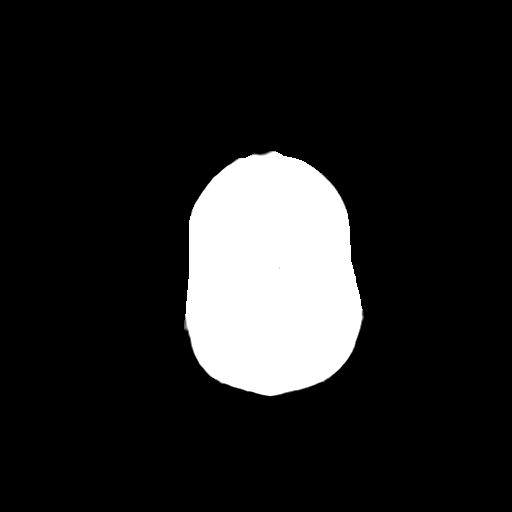

[16 of 30 positions shown; findings below may reference images not displayed]

FINDINGS: Bony calvarium is intact. Mild atrophic changes are again identified
stable from previous exam. No findings to suggest acute hemorrhage,
acute infarction or space-occupying mass lesion are noted. Mild
cavum septum pellucidum is seen.
IMPRESSION: Atrophic and ischemic changes without acute abnormality.

These results were called by telephone at the time of interpretation
on [DATE] at [DATE] to Dr. GAVARRETE, who verbally acknowledged
these results.

## 2015-11-02 IMAGING — CT CT ANGIO NECK
2 of 8 series · 8 of 33 positions shown · IV contrast (APPLIED)
Comparison: CT head without contrast from the same day. CT head
without contrast [DATE].

CLINICAL DATA: Increased confusion and headaches. Stroke like
symptoms.

EXAM:
CT ANGIOGRAPHY HEAD AND NECK
TECHNIQUE: Multidetector CT imaging of the head and neck was performed using
the standard protocol during bolus administration of intravenous
contrast. Multiplanar CT image reconstructions and MIPs were
obtained to evaluate the vascular anatomy. Carotid stenosis
measurements (when applicable) are obtained utilizing NASCET
criteria, using the distal internal carotid diameter as the
denominator.
CONTRAST:  80 mL Isovue 370

[Series 5: cta head · axial · 0.63mm/px · z∈[-150,+75]mm · 4 of 376 slices shown]
[im 76/376  soft-tissue]
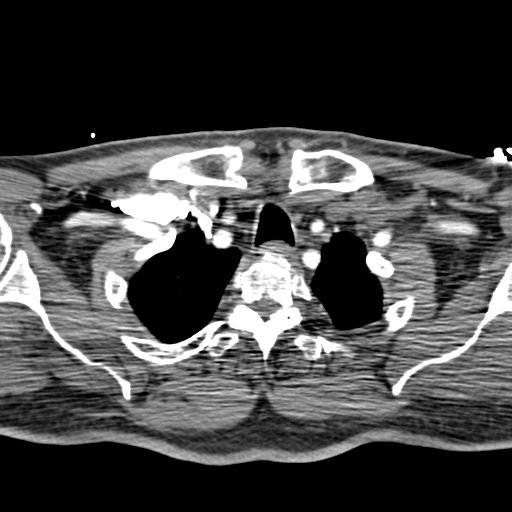
[im 151/376  bone]
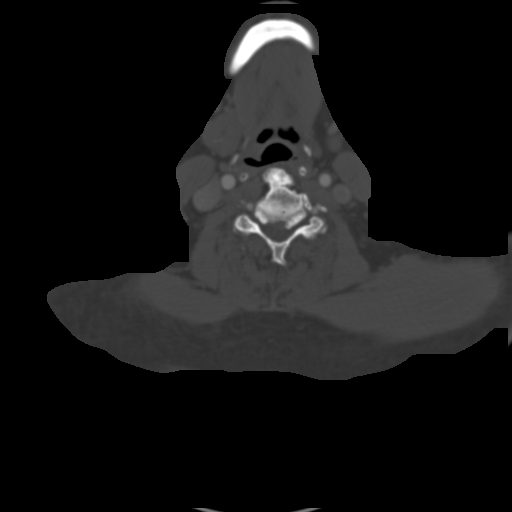
[im 226/376  soft-tissue]
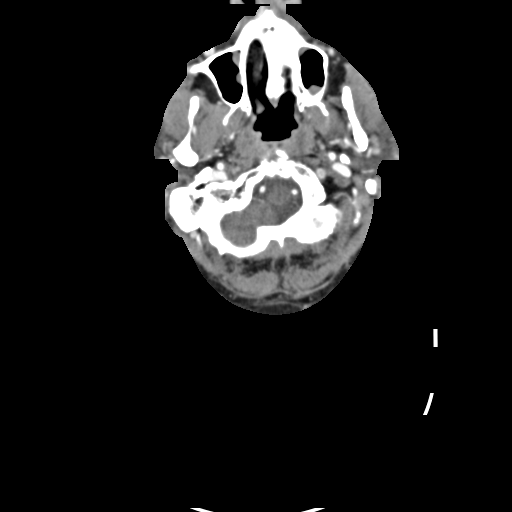
[im 301/376  bone]
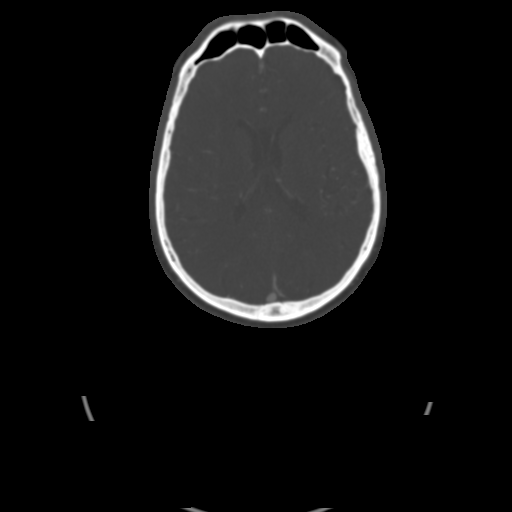

[Series 6: ax thin · axial · 0.56mm/px · z∈[-150,+74]mm · 4 of 374 slices shown]
[im 75/374  soft-tissue]
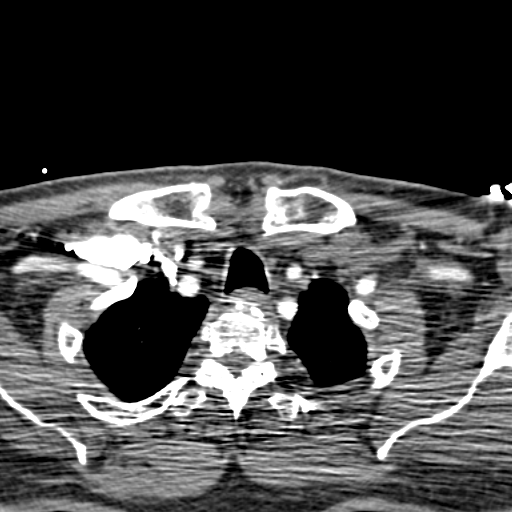
[im 150/374  soft-tissue]
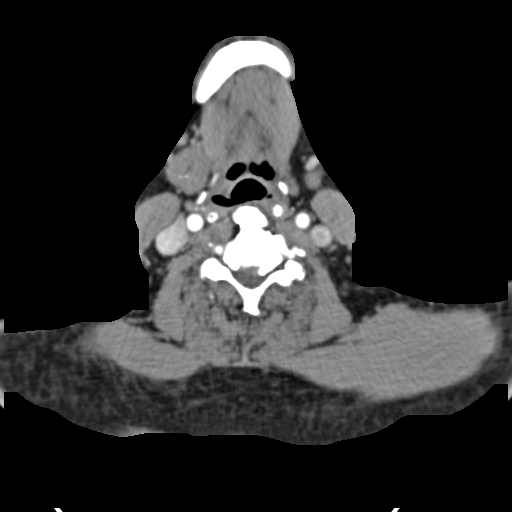
[im 224/374  soft-tissue]
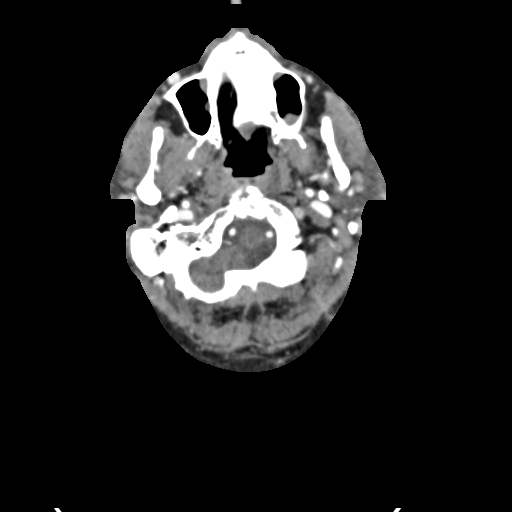
[im 299/374  soft-tissue]
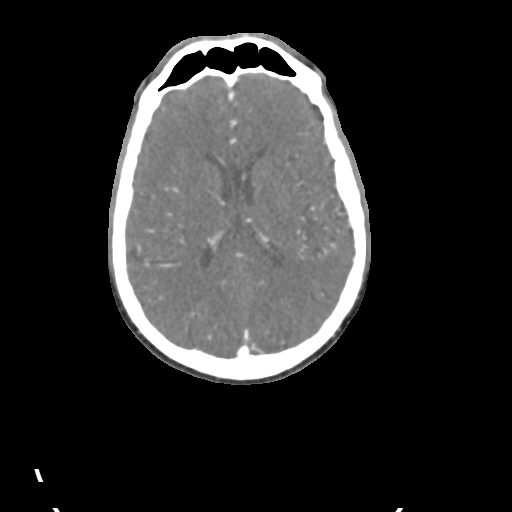

[8 of 33 positions shown; findings below may reference images not displayed]

FINDINGS: CTA NECK

Aortic arch: A 3 vessel arch configuration is present. Minimal
calcifications are present without focal stenosis or aneurysm.

Right carotid system: The right common carotid artery is within
normal limits. Atherosclerotic calcifications are present at the
carotid bifurcation there is no significant stenosis relative to the
more distal vessel. Moderate tortuosity is present in the cervical
right ICA just below the skullbase. There is also focal
calcification in the wall at the skullbase. This could be related to
remote injury. There is no significant luminal stenosis.

Left carotid system: The left common carotid artery is within normal
limits. The lumen is narrowed to 3 mm, without significant stenosis
relative to the more distal vessel. Moderate tortuosity is evident
just below the skullbase. There is no significant associated
stenosis.

Vertebral arteries:The vertebral arteries both originate from the
subclavian arteries. There is moderate proximal stenosis on the
left. There is mild tortuosity proximally in both vessels. The left
vertebral artery is the dominant vessel. Focal wall calcification of
the right vertebral artery at the C6 level is likely
atherosclerotic. There is no focal stenosis or vascular injury to
either vertebral artery otherwise.

Skeleton: Large anterior osteophytes are present at C3-4 and C4-5.
There is ankylosis across the disc space at C5-6. Osseous foraminal
narrowing is evident at C3-4, C4-5, C5-6, and C6-7. Moderate facet
hypertrophy is noted at C7-T1 and T1-2, contributing to osseous
foraminal narrowing. Degenerative changes are noted at C1-2. No
focal lytic or blastic lesions are present. Vertebral body heights
and alignment are maintained. The patient is status post median
sternotomy.

Other neck: No focal mucosal or submucosal lesions are present. The
vocal cords are midline and symmetric. The thyroid is within normal
limits. The submandibular and parotid glands are normal bilaterally.

CTA HEAD

Anterior circulation: Mild atherosclerotic calcifications are
present within the cavernous internal carotid arteries bilaterally.
Those toe with associated stenosis. The terminal ICA is within
normal limits bilaterally. The A1 and M1 segments are within normal
limits. The anterior communicating artery is patent. The MCA
bifurcations are intact. There is moderate narrowing of mid and
distal small branch vessels. No significant proximal stenosis or
occlusion is evident.

Posterior circulation: The vertebral arteries are codominant at the
skullbase. The PICA origins are visualized and normal. The basilar
artery is within normal limits. Both posterior cerebral arteries
originate from the basilar tip. There is mild attenuation of distal
PCA branch vessels.

Venous sinuses: The dural sinuses are patent. The right transverse
sinus is dominant. The straight sinus and internal cerebral veins
are patent. Cortical veins are intact.

Anatomic variants: None.

Delayed phase: The postcontrast images demonstrate no pathologic
enhancement.
IMPRESSION: 1. Atherosclerotic changes of both carotid bifurcations without
significant stenosis.
2. Moderate stenosis at the origin of the left vertebral artery.
3. Multilevel spondylosis of the cervical spine as described.
4. Moderate tortuosity of the high cervical internal carotid
arteries bilaterally without focal stenosis. There is mural
calcification within both distal internal carotid arteries which
could be related to previous vascular injury. No residual luminal
stenosis is evident.
5. Minimal atherosclerotic calcifications of the cavernous internal
carotid arteries bilaterally without significant stenosis.
6. Moderate mid and distal small vessel disease without significant
proximal stenosis, aneurysm, or branch vessel occlusion within the
circle of Willis.

## 2015-11-02 MED ORDER — AMLODIPINE BESYLATE 5 MG PO TABS: 5.0000 mg | ORAL_TABLET | Freq: Every day | ORAL | Status: DC

## 2015-11-02 MED ORDER — HYDRALAZINE HCL 20 MG/ML IJ SOLN: 10.0000 mg | Freq: Four times a day (QID) | INTRAMUSCULAR | Status: DC | PRN

## 2015-11-02 MED ORDER — ONDANSETRON HCL 4 MG PO TABS: 4.0000 mg | ORAL_TABLET | Freq: Four times a day (QID) | ORAL | Status: DC | PRN

## 2015-11-02 MED ORDER — NITROGLYCERIN IN D5W 200-5 MCG/ML-% IV SOLN
INTRAVENOUS | Status: AC
  Administered 2015-11-02: 5 ug/min via INTRAVENOUS
  Filled 2015-11-02: qty 250

## 2015-11-02 MED ORDER — ACETAMINOPHEN 500 MG PO TABS
ORAL_TABLET | ORAL | Status: AC
  Administered 2015-11-02: 1000 mg via ORAL
  Filled 2015-11-02: qty 2

## 2015-11-02 MED ORDER — IOPAMIDOL (ISOVUE-370) INJECTION 76%
80.0000 mL | Freq: Once | INTRAVENOUS | Status: AC | PRN
  Administered 2015-11-02: 80 mL via INTRAVENOUS

## 2015-11-02 MED ORDER — ACETAMINOPHEN 325 MG PO TABS: 650.0000 mg | ORAL_TABLET | Freq: Four times a day (QID) | ORAL | Status: DC | PRN

## 2015-11-02 MED ORDER — NITROGLYCERIN IN D5W 200-5 MCG/ML-% IV SOLN
0.0000 ug/min | Freq: Once | INTRAVENOUS | Status: AC
  Administered 2015-11-02: 5 ug/min via INTRAVENOUS

## 2015-11-02 MED ORDER — MOMETASONE FURO-FORMOTEROL FUM 200-5 MCG/ACT IN AERO
2.0000 | INHALATION_SPRAY | Freq: Two times a day (BID) | RESPIRATORY_TRACT | Status: DC
  Administered 2015-11-02 – 2015-11-03 (×2): 2 via RESPIRATORY_TRACT
  Filled 2015-11-02: qty 8.8

## 2015-11-02 MED ORDER — TIOTROPIUM BROMIDE MONOHYDRATE 18 MCG IN CAPS
18.0000 ug | ORAL_CAPSULE | Freq: Every day | RESPIRATORY_TRACT | Status: DC
Start: 2015-11-02 — End: 2015-11-03
  Administered 2015-11-02: 18 ug via RESPIRATORY_TRACT
  Filled 2015-11-02: qty 5

## 2015-11-02 MED ORDER — ATORVASTATIN CALCIUM 20 MG PO TABS
40.0000 mg | ORAL_TABLET | Freq: Every day | ORAL | Status: DC
  Administered 2015-11-02: 40 mg via ORAL
  Filled 2015-11-02: qty 2

## 2015-11-02 MED ORDER — ONDANSETRON HCL 4 MG/2ML IJ SOLN: 4.0000 mg | Freq: Four times a day (QID) | INTRAMUSCULAR | Status: DC | PRN

## 2015-11-02 MED ORDER — LORAZEPAM 2 MG/ML IJ SOLN: 2.0000 mg | Freq: Once | INTRAMUSCULAR | Status: DC

## 2015-11-02 MED ORDER — APIXABAN 5 MG PO TABS
5.0000 mg | ORAL_TABLET | Freq: Two times a day (BID) | ORAL | Status: DC
  Administered 2015-11-02 – 2015-11-03 (×2): 5 mg via ORAL
  Filled 2015-11-02 (×2): qty 1

## 2015-11-02 MED ORDER — TRAMADOL HCL 50 MG PO TABS: 50.0000 mg | ORAL_TABLET | Freq: Four times a day (QID) | ORAL | Status: DC | PRN

## 2015-11-02 MED ORDER — ADULT MULTIVITAMIN W/MINERALS CH
1.0000 | ORAL_TABLET | Freq: Every day | ORAL | Status: DC
Start: 2015-11-02 — End: 2015-11-03
  Administered 2015-11-03: 1 via ORAL
  Filled 2015-11-02: qty 1

## 2015-11-02 MED ORDER — ACETAMINOPHEN 650 MG RE SUPP: 650.0000 mg | Freq: Four times a day (QID) | RECTAL | Status: DC | PRN

## 2015-11-02 MED ORDER — ACETAMINOPHEN 500 MG PO TABS
1000.0000 mg | ORAL_TABLET | Freq: Once | ORAL | Status: AC
  Administered 2015-11-02: 1000 mg via ORAL

## 2015-11-02 MED ORDER — SODIUM CHLORIDE 0.9% FLUSH
3.0000 mL | Freq: Two times a day (BID) | INTRAVENOUS | Status: DC
  Administered 2015-11-02 – 2015-11-03 (×3): 3 mL via INTRAVENOUS

## 2015-11-02 MED ORDER — CO Q-10 100 MG PO CAPS: 100.0000 mg | ORAL_CAPSULE | Freq: Every day | ORAL | Status: DC

## 2015-11-02 MED ORDER — AMLODIPINE BESYLATE 5 MG PO TABS
5.0000 mg | ORAL_TABLET | Freq: Every day | ORAL | Status: DC
  Administered 2015-11-02 – 2015-11-03 (×2): 5 mg via ORAL
  Filled 2015-11-02 (×2): qty 1

## 2015-11-02 MED ORDER — NITROGLYCERIN 0.4 MG SL SUBL: 0.4000 mg | SUBLINGUAL_TABLET | SUBLINGUAL | Status: DC | PRN

## 2015-11-02 MED ORDER — SODIUM CHLORIDE 0.9 % IV BOLUS (SEPSIS)
1000.0000 mL | Freq: Once | INTRAVENOUS | Status: AC
  Administered 2015-11-02: 1000 mL via INTRAVENOUS

## 2015-11-02 NOTE — Progress Notes (Signed)
*  PRELIMINARY RESULTS* Echocardiogram 2D Echocardiogram has been performed.  [name] 11/02/2015, 6:54 PM

## 2015-11-02 NOTE — ED Notes (Signed)
Spoke with Dr. Tressia Miners regarding patient's heart rate and concern about patient's MRI and receiving Ativan.  Per Dr. Tressia Miners, wait until patient's heart rate is in the 50s before doing MRI and receiving Ativan.

## 2015-11-02 NOTE — Progress Notes (Signed)
Per MD Sainani ok to order CPAP, defer AB for "bug bite" on patient until rounding physician sees him tomorrow and can examine

## 2015-11-02 NOTE — ED Notes (Signed)
SOC evaluation at this time.

## 2015-11-02 NOTE — Care Management Obs Status (Signed)
[place] NOTIFICATION   Patient Details  Name: [name] MRN: [id number] Date of Birth: August 04, 1945   Medicare Observation Status Notification Given:  Yes    Beau Fanny, RN 11/02/2015, 2:12 PM

## 2015-11-02 NOTE — ED Notes (Signed)
Patient transported to CT 

## 2015-11-02 NOTE — Progress Notes (Signed)
Pt. 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 Pt. So far this shift HR has stayed in the 40's bouncing around at times in the 30's and up to 50's but not sustaining in either. Neuro checks have been negative. Pt. Assisted to bedside commode with 1 assist.  No weakness, pt. Denied light headedness or dizziness. Wife at bedside. Will continue to monitor pt.

## 2015-11-02 NOTE — H&P (Signed)
[facility] NAME: Shandy Porres    MR#:  [id number]  DATE OF BIRTH:  1946-03-18  DATE OF ADMISSION:  11/02/2015  PRIMARY CARE PHYSICIAN: [name], MD   REQUESTING/REFERRING PHYSICIAN: Dr. Conni Slipper  CHIEF COMPLAINT:   Chief Complaint  Patient presents with  . Code Stroke    HISTORY OF PRESENT ILLNESS:  [name]  is a 70 y.o. male with a known history of CAD status post CABG, hypertension, chronic A. fib on eliquis, arthritis, COPD not on home oxygen presents to the hospital secondary to headache and right facial tingling numbness. Last seen normal last night by his wife. No obvious neurological deficits noted at this time. According to wife, patient said that he was feeling not so great and went to bed last night. This morning when he woke up he felt sick and weak. 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 He came back inside and felt extensive pressure in his head and then his right-sided face started throbbing and he had tingling numbness and weakness. CT of the head in the emergency room did not show any acute findings. His heart rate is in the 40s because he took an extra dose of metoprolol today thinking this could be his A. fib symptoms. He remains in normal sinus rhythm.  His symptoms have resolved at this time. His blood pressure initially was greater than [id number] systolic. Received some IV nitroglycerin drip and his blood pressure is down to 0000000 systolic. Telemetry neurology recommended admission for possible TIA.  PAST MEDICAL HISTORY:   Past Medical History  Diagnosis Date  . Coronary artery disease     a. 04/2009 CABG x 3 (LIMA->LAD, VG->OM1, VG->PDA);  b. 09/2009 Cath: occluded VG x 2 w/ patent LIMA and L->R collats. EF 55%, mild antlat HK;  c. 10/2011 MV: EF 53%, no isch/infarct-->low risk.  . Pure hypercholesterolemia   . Hypertension   . Atrial fibrillation ([place])     a. Dx 2013, recurred  02/2014, CHA2DS2VASc = 3 -->placed on Eliquis;  b. 02/2014 Echo: EF 50-55%, mid and apical anterior septum and mid and apical inf septum are abnl, mild to mod Ao sclerosis w/o AS.  Marland Kitchen History of arthritis   . Arthritis   . Chicken pox   . Rheumatic fever   . COPD (chronic obstructive pulmonary disease) ([place])   . Arthritis   . OSA (obstructive sleep apnea)     a. On CPAP  . Chronic lymphocytic leukemia ([place])     a. Dx 02/2014.    PAST SURGICAL HISTORY:   Past Surgical History  Procedure Laterality Date  . Cardiac catheterization  2011    Dr Fletcher Anon  . Cholecystectomy    . Appendectomy  1988  . Tonsillectomy and adenoidectomy  1956  . Coronary artery bypass graft  2010    3v  . Hernia repair      abdominal  . Hernia repair      SOCIAL HISTORY:   Social History  Substance Use Topics  . Smoking status: Former Smoker -- 1.00 packs/day for 40 years    Types: Cigarettes    Quit date: 07/21/2006  . Smokeless tobacco: Never Used  . Alcohol Use: No     Comment: occasionally    FAMILY HISTORY:   Family History  Problem Relation Age of Onset  . Coronary artery disease      family history  . Heart disease  Mother     DRUG ALLERGIES:  No Known Allergies  REVIEW OF SYSTEMS:   Review of Systems  Constitutional: Negative for fever, chills, weight loss and malaise/fatigue.  HENT: Positive for hearing loss. Negative for ear discharge, ear pain, nosebleeds and tinnitus.   Eyes: Negative for blurred vision, double vision and photophobia.  Respiratory: Negative for cough, hemoptysis, shortness of breath and wheezing.   Cardiovascular: Negative for chest pain, palpitations, orthopnea and leg swelling.  Gastrointestinal: Negative for heartburn, nausea, vomiting, abdominal pain, diarrhea, constipation and melena.  Genitourinary: Negative for dysuria, urgency, frequency and hematuria.  Musculoskeletal: Negative for myalgias, back pain and neck pain.  Skin: Negative for rash.   Neurological: Positive for tingling, sensory change and headaches. Negative for dizziness, tremors, speech change and focal weakness.  Endo/Heme/Allergies: Does not bruise/bleed easily.  Psychiatric/Behavioral: Negative for depression.    MEDICATIONS AT HOME:   Prior to Admission medications   Medication Sig Start Date End Date Taking? Authorizing Provider  amiodarone (PACERONE) 200 MG tablet Take 0.5 tablets (100 mg total) by mouth 2 (two) times daily. 10/24/15  Yes [name], MD  apixaban (ELIQUIS) 5 MG TABS tablet Take 1 tablet (5 mg total) by mouth 2 (two) times daily. 02/12/15  Yes [name], MD  atorvastatin (LIPITOR) 40 MG tablet Take 40 mg by mouth at bedtime.   Yes Historical Provider, MD  budesonide-formoterol (SYMBICORT) 160-4.5 MCG/ACT inhaler Inhale 2 puffs into the lungs 2 (two) times daily.   Yes Historical Provider, MD  Coenzyme Q10 (CO Q-10) 100 MG CAPS Take 100 mg by mouth at bedtime.    Yes Historical Provider, MD  furosemide (LASIX) 20 MG tablet Take 20 mg by mouth 2 (two) times daily as needed for edema.   Yes Historical Provider, MD  metoprolol tartrate (LOPRESSOR) 25 MG tablet Take 1 tablet (25 mg total) by mouth 2 (two) times daily. 09/17/15  Yes [name], MD  Multiple Vitamin (MULTIVITAMIN WITH MINERALS) TABS tablet Take 1 tablet by mouth daily.   Yes Historical Provider, MD  nitroGLYCERIN (NITROSTAT) 0.4 MG SL tablet Place 0.4 mg under the tongue every 5 (five) minutes as needed for chest pain.    Yes Historical Provider, MD  tiotropium (SPIRIVA) 18 MCG inhalation capsule Place 18 mcg into inhaler and inhale at bedtime.   Yes Historical Provider, MD  traMADol (ULTRAM) 50 MG tablet Take 50 mg by mouth every 6 (six) hours as needed for moderate pain.   Yes Historical Provider, MD      VITAL SIGNS:  Blood pressure 130/58, pulse 43, temperature 98.3 F (36.8 C), temperature source Oral, resp. rate 12, height 5\' 9"  (1.753 m), weight 102.059 kg (225  lb), SpO2 97 %.  PHYSICAL EXAMINATION:   Physical Exam  GENERAL:  70 y.o.-year-old patient lying in the bed with no acute distress. Hard of hearing EYES: Pupils equal, round, reactive to light and accommodation. No scleral icterus. Extraocular muscles intact.  HEENT: Head atraumatic, normocephalic. Oropharynx and nasopharynx clear.  NECK:  Supple, no jugular venous distention. No thyroid enlargement, no tenderness.  LUNGS: Normal breath sounds bilaterally, no wheezing, rales,rhonchi or crepitation. No use of accessory muscles of respiration.  CARDIOVASCULAR: S1, S2 normal. No  rubs, or gallops. 3/6 systolic murmur present. ABDOMEN: Soft, nontender, nondistended. Bowel sounds present. No organomegaly or mass.  EXTREMITIES: No pedal edema, cyanosis, or clubbing.  NEUROLOGIC: Cranial nerves II through XII are intact. Muscle strength 5/5 in both upper extremities about 3-4/5 in  lower extremities due to arthritis. Sensation intact. Gait not checked.  PSYCHIATRIC: The patient is alert and oriented x 3.  SKIN: No obvious rash, lesion, or ulcer.   LABORATORY PANEL:   CBC  Recent Labs Lab 11/02/15 1130  WBC 7.7  HGB 13.4  HCT 38.8*  PLT 139*   ------------------------------------------------------------------------------------------------------------------  Chemistries   Recent Labs Lab 11/02/15 1130  NA 139  K 4.2  CL 108  CO2 27  GLUCOSE 123*  BUN 17  CREATININE 1.05  CALCIUM 9.1  AST 21  ALT 28  ALKPHOS 82  BILITOT 1.3*   ------------------------------------------------------------------------------------------------------------------  Cardiac Enzymes  Recent Labs Lab 11/02/15 1130  TROPONINI <0.03   ------------------------------------------------------------------------------------------------------------------  RADIOLOGY:  Ct Angio Head W/cm &/or Wo Cm  11/02/2015  CLINICAL DATA:  Increased confusion and headaches. Stroke like symptoms. EXAM: CT ANGIOGRAPHY  HEAD AND NECK TECHNIQUE: Multidetector CT imaging of the head and neck was performed using the standard protocol during bolus administration of intravenous contrast. Multiplanar CT image reconstructions and MIPs were obtained to evaluate the vascular anatomy. Carotid stenosis measurements (when applicable) are obtained utilizing NASCET criteria, using the distal internal carotid diameter as the denominator. CONTRAST:  80 mL Isovue 370 COMPARISON:  CT head without contrast from the same day. CT head without contrast 08/22/2015. FINDINGS: CTA NECK Aortic arch: A 3 vessel arch configuration is present. Minimal calcifications are present without focal stenosis or aneurysm. Right carotid system: The right common carotid artery is within normal limits. Atherosclerotic calcifications are present at the carotid bifurcation there is no significant stenosis relative to the more distal vessel. Moderate tortuosity is present in the cervical right ICA just below the skullbase. There is also focal calcification in the wall at the skullbase. This could be related to remote injury. There is no significant luminal stenosis. Left carotid system: The left common carotid artery is within normal limits. The lumen is narrowed to 3 mm, without significant stenosis relative to the more distal vessel. Moderate tortuosity is evident just below the skullbase. There is no significant associated stenosis. Vertebral arteries:The vertebral arteries both originate from the subclavian arteries. There is moderate proximal stenosis on the left. There is mild tortuosity proximally in both vessels. The left vertebral artery is the dominant vessel. Focal wall calcification of the right vertebral artery at the C6 level is likely atherosclerotic. There is no focal stenosis or vascular injury to either vertebral artery otherwise. Skeleton: Large anterior osteophytes are present at C3-4 and C4-5. There is ankylosis across the disc space at C5-6. Osseous  foraminal narrowing is evident at C3-4, C4-5, C5-6, and C6-7. Moderate facet hypertrophy is noted at C7-T1 and T1-2, contributing to osseous foraminal narrowing. Degenerative changes are noted at C1-2. No focal lytic or blastic lesions are present. Vertebral body heights and alignment are maintained. The patient is status post median sternotomy. Other neck: No focal mucosal or submucosal lesions are present. The vocal cords are midline and symmetric. The thyroid is within normal limits. The submandibular and parotid glands are normal bilaterally. CTA HEAD Anterior circulation: Mild atherosclerotic calcifications are present within the cavernous internal carotid arteries bilaterally. Those toe with associated stenosis. The terminal ICA is within normal limits bilaterally. The A1 and M1 segments are within normal limits. The anterior communicating artery is patent. The MCA bifurcations are intact. There is moderate narrowing of mid and distal small branch vessels. No significant proximal stenosis or occlusion is evident. Posterior circulation: The vertebral arteries are codominant at the skullbase. The PICA  origins are visualized and normal. The basilar artery is within normal limits. Both posterior cerebral arteries originate from the basilar tip. There is mild attenuation of distal PCA branch vessels. Venous sinuses: The dural sinuses are patent. The right transverse sinus is dominant. The straight sinus and internal cerebral veins are patent. Cortical veins are intact. Anatomic variants: None. Delayed phase: The postcontrast images demonstrate no pathologic enhancement. IMPRESSION: 1. Atherosclerotic changes of both carotid bifurcations without significant stenosis. 2. 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 3. 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 4. Moderate tortuosity of the high cervical internal carotid arteries bilaterally without focal stenosis. There is mural  calcification within both distal internal carotid arteries which could be related to previous vascular injury. No residual luminal stenosis is evident. 5. Minimal atherosclerotic calcifications of the cavernous internal carotid arteries bilaterally without significant stenosis. 6. Moderate mid and distal small vessel disease without significant proximal stenosis, aneurysm, or branch vessel occlusion within the circle of Willis. Electronically Signed   By: San Morelle M.D.   On: 11/02/2015 12:49   Ct Head Wo Contrast  11/02/2015  CLINICAL DATA:  Increased confusion and headaches EXAM: CT HEAD WITHOUT CONTRAST TECHNIQUE: Contiguous axial images were obtained from the base of the skull through the vertex without intravenous contrast. COMPARISON:  08/22/2015 FINDINGS: Bony calvarium is intact. Mild atrophic changes are again identified stable from previous exam. No findings to suggest acute hemorrhage, acute infarction or space-occupying mass lesion are noted. Mild cavum septum pellucidum is seen. IMPRESSION: Atrophic and ischemic changes without acute abnormality. These results were called by telephone at the time of interpretation on 11/02/2015 at 11:18 am to Dr. Jacqualine Code, who verbally acknowledged these results. Electronically Signed   By: Inez Catalina M.D.   On: 11/02/2015 11:20   Ct Angio Neck W/cm &/or Wo/cm  11/02/2015  CLINICAL DATA:  Increased confusion and headaches. Stroke like symptoms. EXAM: CT ANGIOGRAPHY HEAD AND NECK TECHNIQUE: Multidetector CT imaging of the head and neck was performed using the standard protocol during bolus administration of intravenous contrast. Multiplanar CT image reconstructions and MIPs were obtained to evaluate the vascular anatomy. Carotid stenosis measurements (when applicable) are obtained utilizing NASCET criteria, using the distal internal carotid diameter as the denominator. CONTRAST:  80 mL Isovue 370 COMPARISON:  CT head without contrast from the same day. CT  head without contrast 08/22/2015. FINDINGS: CTA NECK Aortic arch: A 3 vessel arch configuration is present. Minimal calcifications are present without focal stenosis or aneurysm. Right carotid system: The right common carotid artery is within normal limits. Atherosclerotic calcifications are present at the carotid bifurcation there is no significant stenosis relative to the more distal vessel. Moderate tortuosity is present in the cervical right ICA just below the skullbase. There is also focal calcification in the wall at the skullbase. This could be related to remote injury. There is no significant luminal stenosis. Left carotid system: The left common carotid artery is within normal limits. The lumen is narrowed to 3 mm, without significant stenosis relative to the more distal vessel. Moderate tortuosity is evident just below the skullbase. There is no significant associated stenosis. Vertebral arteries:The vertebral arteries both originate from the subclavian arteries. There is moderate proximal stenosis on the left. There is mild tortuosity proximally in both vessels. The left vertebral artery is the dominant vessel. Focal wall calcification of the right vertebral artery at the C6 level is likely atherosclerotic. There is no focal stenosis or vascular  injury to either vertebral artery otherwise. Skeleton: Large anterior osteophytes are present at C3-4 and C4-5. There is ankylosis across the disc space at C5-6. Osseous foraminal narrowing is evident at C3-4, C4-5, C5-6, and C6-7. Moderate facet hypertrophy is noted at C7-T1 and T1-2, contributing to osseous foraminal narrowing. Degenerative changes are noted at C1-2. No focal lytic or blastic lesions are present. Vertebral body heights and alignment are maintained. The patient is status post median sternotomy. Other neck: No focal mucosal or submucosal lesions are present. The vocal cords are midline and symmetric. The thyroid is within normal limits. The  submandibular and parotid glands are normal bilaterally. CTA HEAD Anterior circulation: Mild atherosclerotic calcifications are present within the cavernous internal carotid arteries bilaterally. Those toe with associated stenosis. The terminal ICA is within normal limits bilaterally. The A1 and M1 segments are within normal limits. The anterior communicating artery is patent. The MCA bifurcations are intact. There is moderate narrowing of mid and distal small branch vessels. No significant proximal stenosis or occlusion is evident. Posterior circulation: The vertebral arteries are codominant at the skullbase. The PICA origins are visualized and normal. The basilar artery is within normal limits. Both posterior cerebral arteries originate from the basilar tip. There is mild attenuation of distal PCA branch vessels. Venous sinuses: The dural sinuses are patent. The right transverse sinus is dominant. The straight sinus and internal cerebral veins are patent. Cortical veins are intact. Anatomic variants: None. Delayed phase: The postcontrast images demonstrate no pathologic enhancement. IMPRESSION: 1. Atherosclerotic changes of both carotid bifurcations without significant stenosis. 2. 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 3. 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 4. Moderate tortuosity of the high cervical internal carotid arteries bilaterally without focal stenosis. There is mural calcification within both distal internal carotid arteries which could be related to previous vascular injury. No residual luminal stenosis is evident. 5. Minimal atherosclerotic calcifications of the cavernous internal carotid arteries bilaterally without significant stenosis. 6. Moderate mid and distal small vessel disease without significant proximal stenosis, aneurysm, or branch vessel occlusion within the circle of Willis. Electronically Signed   By: San Morelle M.D.   On:  11/02/2015 12:49    EKG:   Orders placed or performed during the hospital encounter of 11/02/15  . ED EKG  . ED EKG  . EKG 12-Lead  . EKG 12-Lead    IMPRESSION AND PLAN:   [name]  is a 70 y.o. male with a known history of CAD status post CABG, hypertension, chronic A. fib on eliquis, arthritis, COPD not on home oxygen presents to the hospital secondary to headache and right facial tingling numbness.  #1 Headache and right facial symptoms-could be from elevated blood pressure versus TIA -Patient already on eliquis. Could be small vessel disease in his brain. -CT of the head without any acute findings. CT angiogram shows atherosclerosis and moderate plaque - on eliquis and statin - PT consult  #2 Paroxysmal Afib-currently in normal sinus rhythm. Hold amiodarone and metoprolol. Baseline heart rate runs in the 50s to 60 range. Referred for a pacemaker in the past. Being closely monitored by [facility]. -Continue to monitor. Taken extra dose of metoprolol this morning. - continue eliquis for anticoagulation  #3 COPD-stable for now. Not needing any extra oxygen. Continue his home inhalers  #4 Malignant HTN-patient does not take any blood pressure medicines at home other than metoprolol. Metoprolol is being held now due to his bradycardia. -Started on Norvasc  for now and also on IV hydralazine when necessary  #5 DVT prophylaxis-already on eliquis   All the records are reviewed and case discussed with ED provider. Management plans discussed with the patient, family and they are in agreement.  CODE STATUS: Full Code  TOTAL TIME TAKING CARE OF THIS PATIENT: 50 minutes.    Gladstone Lighter M.D on 11/02/2015 at 3:08 PM  Between 7am to 6pm - Pager - [phone]  After 6pm go to [email or web address] - password [email or web address] [facility]  Office  [phone]  CC: Primary care physician; [name], MD

## 2015-11-02 NOTE — ED Notes (Signed)
Called Code stroke by triage called [place] to initiate code stroke spoke to [facility] @1103 

## 2015-11-02 NOTE — ED Notes (Signed)
Per wife, patient took an extra dose of metoprolol this morning.

## 2015-11-02 NOTE — ED Notes (Signed)
Patient presents to the ED with confusion, severe headache, some dizziness, and some generalized weakness that patient states became very severe around 7:30am this morning.  Patient reports slight dizziness yesterday evening prior to going to bed.

## 2015-11-02 NOTE — ED Provider Notes (Signed)
[facility] Emergency Department Provider Note  ____________________________________________  Time seen: Approximately 11:44 AM  I have reviewed the triage vital signs and the nursing notes.   HISTORY  Chief Complaint Code Stroke    HPI [name] is a 70 y.o. male who was mowing the lawn yesterday afterwards he felt a little dizzy and weak. This got worse over the course of the evening and then this morning he woke up with a bad headache and feeling very weak and dizzy slow of speech but not slurry. The weakness is very mild on the right side with a little bit of numbness. There are no other findings. Patient took A metoprolol this morning, since blood pressure was very high 99991111 systolic. The emergency room his heart rates in the 40s and low 50s. Patient also complains of a bad dull achy headache is not pounding and is diffuse. Gradual onset but not severe.  Past Medical History  Diagnosis Date  . Coronary artery disease     a. 04/2009 CABG x 3 (LIMA->LAD, VG->OM1, VG->PDA);  b. 09/2009 Cath: occluded VG x 2 w/ patent LIMA and L->R collats. EF 55%, mild antlat HK;  c. 10/2011 MV: EF 53%, no isch/infarct-->low risk.  . Pure hypercholesterolemia   . Hypertension   . Atrial fibrillation ([place])     a. Dx 2013, recurred 02/2014, CHA2DS2VASc = 3 -->placed on Eliquis;  b. 02/2014 Echo: EF 50-55%, mid and apical anterior septum and mid and apical inf septum are abnl, mild to mod Ao sclerosis w/o AS.  Marland Kitchen History of arthritis   . Arthritis   . Chicken pox   . Rheumatic fever   . COPD (chronic obstructive pulmonary disease) ([place])   . Arthritis   . OSA (obstructive sleep apnea)     a. On CPAP  . Chronic lymphocytic leukemia ([place])     a. Dx 02/2014.    Patient Active Problem List   Diagnosis Date Noted  . Chronic diastolic CHF (congestive heart failure) ([place]) 09/20/2015  . Atrial flutter ([place]) 09/17/2015  . Atrial fibrillation with RVR ([place]) 09/14/2015  .  Pre-syncope 08/23/2015  . Near syncope   . CLL (chronic lymphocytic leukemia) ([place])   . Bradycardia   . PTSD (post-traumatic stress disorder)   . Chest pain 08/22/2015  . Osteoarthritis of both knees 07/05/2015  . Dog bite of finger 09/05/2014  . Open fracture of tuft of distal phalanx of finger 09/05/2014  . Chronic lymphatic leukemia.  Stage 2 given leukocytosis, lymphadenopathy and splenomegaly. Sept 2015 - Rapidly progressive bulky lymphadenopathy. Oct 2015 - Positive for ZAP-70 and CD38. 03/16/2014  . Leukocytosis 03/07/2014  . Medicare annual wellness visit, subsequent 07/08/2012  . Screening for colon cancer 01/07/2012  . COPD (chronic obstructive pulmonary disease) ([place]) 01/01/2012  . Shortness of breath 10/09/2011  . Paroxysmal atrial fibrillation ([place]) 10/09/2011  . HYPERCHOLESTEROLEMIA 11/28/2009  . HYPERTENSION, BENIGN 11/28/2009  . CAD, ARTERY BYPASS GRAFT 11/28/2009  . TOBACCO ABUSE, HX OF 11/28/2009    Past Surgical History  Procedure Laterality Date  . Cardiac catheterization  2011    Dr Fletcher Anon  . Cholecystectomy    . Appendectomy  1988  . Tonsillectomy and adenoidectomy  1956  . Coronary artery bypass graft  2010    3v  . Hernia repair      abdominal  . Hernia repair      Current Outpatient Rx  Name  Route  Sig  Dispense  Refill  . amiodarone (  PACERONE) 200 MG tablet   Oral   Take 0.5 tablets (100 mg total) by mouth 2 (two) times daily.   30 tablet   6   . apixaban (ELIQUIS) 5 MG TABS tablet   Oral   Take 1 tablet (5 mg total) by mouth 2 (two) times daily.   60 tablet   6   . atorvastatin (LIPITOR) 40 MG tablet   Oral   Take 40 mg by mouth at bedtime.         . budesonide-formoterol (SYMBICORT) 160-4.5 MCG/ACT inhaler   Inhalation   Inhale 2 puffs into the lungs 2 (two) times daily.         . Coenzyme Q10 (CO Q-10) 100 MG CAPS   Oral   Take 100 mg by mouth at bedtime.          . furosemide (LASIX) 20 MG tablet   Oral   Take 1  tablet (20 mg total) by mouth 2 (two) times daily as needed.   60 tablet   3   . metoprolol tartrate (LOPRESSOR) 25 MG tablet   Oral   Take 1 tablet (25 mg total) by mouth 2 (two) times daily.   60 tablet   3   . Multiple Vitamin (MULTIVITAMIN WITH MINERALS) TABS tablet   Oral   Take 1 tablet by mouth daily.         . nitroGLYCERIN (NITROSTAT) 0.4 MG SL tablet   Sublingual   Place 0.4 mg under the tongue every 5 (five) minutes as needed for chest pain.          Marland Kitchen tiotropium (SPIRIVA) 18 MCG inhalation capsule   Inhalation   Place 18 mcg into inhaler and inhale at bedtime.         . traMADol (ULTRAM) 50 MG tablet   Oral   Take 1 tablet (50 mg total) by mouth every 6 (six) hours as needed. Patient taking differently: Take 50 mg by mouth every 6 (six) hours as needed for moderate pain.    90 tablet   0     Allergies Review of patient's allergies indicates no known allergies.  Family History  Problem Relation Age of Onset  . Coronary artery disease      family history  . Heart disease Mother     Social History Social History  Substance Use Topics  . Smoking status: Former Smoker -- 1.00 packs/day for 40 years    Types: Cigarettes    Quit date: 07/21/2006  . Smokeless tobacco: Never Used  . Alcohol Use: No     Comment: occasionally    Review of Systems Constitutional: No fever/chills Eyes: No visual changes. ENT: No sore throat. Cardiovascular: Denies chest pain. Respiratory: Denies shortness of breath. Gastrointestinal: No abdominal pain.  No nausea, no vomiting.  No diarrhea.  No constipation. Genitourinary: Negative for dysuria. Musculoskeletal: Negative for back pain. Skin: Negative for rash. Neurological: Negative for headaches, focal weakness or numbness.  10-point ROS otherwise negative.  ____________________________________________   PHYSICAL EXAM:  VITAL SIGNS: ED Triage Vitals  Enc Vitals Group     BP 11/02/15 1121 212/100 mmHg      Pulse Rate 11/02/15 1113 48     Resp 11/02/15 1113 15     Temp 11/02/15 1113 98.3 F (36.8 C)     Temp Source 11/02/15 1113 Oral     SpO2 11/02/15 1113 99 %     Weight 11/02/15 1113 225 lb (102.059 kg)  Height 11/02/15 1113 5\' 9"  (1.753 m)     Head Cir --      Peak Flow --      Pain Score --      Pain Loc --      Pain Edu? --      Excl. in [place]? --     Constitutional: Alert and oriented. Well appearing and in no acute distress. Eyes: Conjunctivae are normal. PERRL. EOMI. unable to see fundi well Head: Atraumatic. Nose: No congestion/rhinnorhea. Mouth/Throat: Mucous membranes are moist.  Oropharynx non-erythematous. Neck: No stridor.  Cardiovascular: Normal rate, regular rhythm. Grossly normal heart sounds.  Good peripheral circulation. Respiratory: Normal respiratory effort.  No retractions. Lungs CTAB. Gastrointestinal: Soft and nontender. No distention. No abdominal bruits. No CVA tenderness. Musculoskeletal: No lower extremity tenderness nor edema.  No joint effusions. Neurologic:  Speech and language is clear but slow. Patient has a small amount of right sided weakness. Complains of little bit of numbness and tingling there too. Stroke scale is 3 by the stroke nurse and to by the neurologist who saw him. Skin:  Skin is warm, dry and intact. No rash noted. Psychiatric: Mood and affect are normal. Speech and behavior are normal.  ____________________________________________   LABS (all labs ordered are listed, but only abnormal results are displayed)  Labs Reviewed  GLUCOSE, CAPILLARY - Abnormal; Notable for the following:    Glucose-Capillary 108 (*)    All other components within normal limits  PROTIME-INR  APTT  CBC  DIFFERENTIAL  COMPREHENSIVE METABOLIC PANEL  TROPONIN I  CBG MONITORING, ED   ____________________________________________  EKG  EKG read and interpreted by me shows sinus bradycardia rate of 48 normal axis there is irregular baseline makes it  difficult to interpret the ST elevation or depression. ____________________________________________  [place]  Radiology reports head CT is normal. _________________________________ CT angiogram read as no marked abnormalities basically by the radiologist ___________   PROCEDURES Associated neurologist sees patient. Recommend CT angiogram will try some IV nitroglycerin as his blood pressures up to 99991111 systolic and I did not want to give him any further beta blockers with his heart rate being that low I also did not want to give him any calcium channel blockers for the same reason.  ____________________________________________   INITIAL IMPRESSION / ASSESSMENT AND PLAN / ED COURSE  Pertinent labs & imaging results that were available during my care of the patient were reviewed by me and considered in my medical decision making (see chart for details).  Blood pressure was very high low dose IV nitroglycerin was given this dropped the patient's blood pressure down From [id number] to 0000000 systolic. Nitroglycerin was shut off her blood pressure continued to drop some fluids were started. Patient feels much better however. ____________________________________________   FINAL CLINICAL IMPRESSION(S) / ED DIAGNOSES  Final diagnoses:  Stroke-like symptoms      Nena Polio, MD 11/02/15 1313

## 2015-11-03 ENCOUNTER — Other Ambulatory Visit: Payer: Self-pay | Admitting: Cardiovascular Disease

## 2015-11-03 DIAGNOSIS — I739 Peripheral vascular disease, unspecified: Secondary | ICD-10-CM | POA: Diagnosis not present

## 2015-11-03 DIAGNOSIS — J449 Chronic obstructive pulmonary disease, unspecified: Secondary | ICD-10-CM | POA: Diagnosis not present

## 2015-11-03 DIAGNOSIS — R519 Headache, unspecified: Secondary | ICD-10-CM | POA: Insufficient documentation

## 2015-11-03 DIAGNOSIS — F05 Delirium due to known physiological condition: Secondary | ICD-10-CM | POA: Diagnosis not present

## 2015-11-03 DIAGNOSIS — R69 Illness, unspecified: Secondary | ICD-10-CM | POA: Diagnosis not present

## 2015-11-03 DIAGNOSIS — I160 Hypertensive urgency: Secondary | ICD-10-CM | POA: Diagnosis not present

## 2015-11-03 DIAGNOSIS — R001 Bradycardia, unspecified: Secondary | ICD-10-CM | POA: Diagnosis not present

## 2015-11-03 DIAGNOSIS — R51 Headache: Secondary | ICD-10-CM

## 2015-11-03 DIAGNOSIS — I455 Other specified heart block: Secondary | ICD-10-CM | POA: Insufficient documentation

## 2015-11-03 DIAGNOSIS — I25810 Atherosclerosis of coronary artery bypass graft(s) without angina pectoris: Secondary | ICD-10-CM | POA: Insufficient documentation

## 2015-11-03 DIAGNOSIS — Z9989 Dependence on other enabling machines and devices: Secondary | ICD-10-CM

## 2015-11-03 DIAGNOSIS — G4733 Obstructive sleep apnea (adult) (pediatric): Secondary | ICD-10-CM | POA: Insufficient documentation

## 2015-11-03 DIAGNOSIS — I480 Paroxysmal atrial fibrillation: Secondary | ICD-10-CM

## 2015-11-03 LAB — BASIC METABOLIC PANEL
ANION GAP: 6 (ref 5–15)
BUN: 11 mg/dL (ref 6–20)
CALCIUM: 8.4 mg/dL — AB (ref 8.9–10.3)
CHLORIDE: 106 mmol/L (ref 101–111)
CO2: 23 mmol/L (ref 22–32)
Creatinine, Ser: 0.87 mg/dL (ref 0.61–1.24)
GFR calc non Af Amer: >60 mL/min (ref >60)
Glucose, Bld: 130 mg/dL — ABNORMAL HIGH (ref 65–99)
Potassium: 3.9 mmol/L (ref 3.5–5.1)
SODIUM: 135 mmol/L (ref 135–145)

## 2015-11-03 LAB — CBC
HEMATOCRIT: 37.2 % — AB (ref 40.0–52.0)
HEMOGLOBIN: 12.9 g/dL — AB (ref 13.0–18.0)
MCH: 32.3 pg (ref 26.0–34.0)
MCHC: 34.6 g/dL (ref 32.0–36.0)
MCV: 93.2 fL (ref 80.0–100.0)
PLATELETS: 124 10*3/uL — AB (ref 150–440)
RBC: 4 MIL/uL — ABNORMAL LOW (ref 4.40–5.90)
RDW: 14.4 % (ref 11.5–14.5)
WBC: 6.1 10*3/uL (ref 3.8–10.6)

## 2015-11-03 LAB — ECHOCARDIOGRAM COMPLETE
Height: 69 in
WEIGHTICAEL: 3600 [oz_av]

## 2015-11-03 MED ORDER — DOXYCYCLINE HYCLATE 50 MG PO CAPS: 100.0000 mg | ORAL_CAPSULE | Freq: Two times a day (BID) | ORAL | Status: DC

## 2015-11-03 MED ORDER — AMLODIPINE BESYLATE 5 MG PO TABS: 5.0000 mg | ORAL_TABLET | Freq: Every day | ORAL | Status: DC

## 2015-11-03 MED ORDER — NITROGLYCERIN 0.4 MG SL SUBL: 0.4000 mg | SUBLINGUAL_TABLET | SUBLINGUAL | Status: DC | PRN

## 2015-11-03 NOTE — Consult Note (Signed)
Cardiology Consult    Patient ID: [name] MRN: [id number], DOB/AGE: [id number]   Admit date: 11/02/2015 Date of Consult: 11/03/2015  Primary Physician: Michael Mast, MD Reason for Consult: Severe HTN, bradycardia, pauses Primary Cardiologist: [name] Requesting Provider: Dr. Tressia Doyle  Patient Profile    70 yo male w/ PMH of CAD (s/p CABG x3 in 2010 w/ LIMA-LAD, SVG-OM, SVG-PDA, occluded SVG x 2 by cath in 09/2009 with patent LIMA-LAD), PAF (on Eliquis), HTN, HLD, COPD, OSA and PTSD who presented to [facility] on 11/02/15 with headache, severe hypertension.   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 Continued to not feel well and presented to the emergency room. Denied any focal neurologic deficits. He was reluctant to take nitroglycerin as this was old, did not want a headache. He has been reporting having elevated pressures in the mornings even on his last clinic visit. Denies any recent stressors  In the emergency room, treated with nitroglycerin infusion, then developed hypotension and this was stopped He had CT scan showing carotid disease, vertebral artery disease, other small vessel disease around circle of Willis.   Telemetry overnight showing bradycardia, pauses. He did not have CPAP, although he did request this. Did not sleep well as he was woken up through the night secondary to arrhythmia.   this a.m., feeling well, back to baseline, systolic pressures [id number] up to 140s. He did receive amlodipine 5 mg this morning    other past medical history reviewed  H/o  PTSD He Smoked for 40 years, quit in 2009 He has a history of CLL atrial fibrillation on 10/11/2014. Converted back to normal with metoprolol, diltiazem, amiodarone. Possible conversion  pause.  atrial fibrillation/flutter starting September 12 2015, then persistent flutter In the clinic his heart rate was 126 bpm Amiodarone dosing was increased, amlodipine was changed to diltiazem 120 mg daily, metoprolol increased up to 25 mill grams twice a day, Converted to normal sinus rhythm at home  tachycardia concerning for atrial fibrillation 05/10/2015. Lasted only several minutes Rate possibly up to 140 bpm.  Previously has been very active, building a barn, tool shed, taking care of animals some benefits from the [place] for agent orange exposure and his cancer  total cholesterol 137, LDL 86, earlier in the year  chronic leg pain since he was young.  Previous leg pain with Crestor  admitted to the hospital on September 26 2011 for malaise, appearing pale, irregular heart rhythm and noted to be in atrial fibrillation. He was given medication for rhythm control and he converted to normal sinus rhythm later that evening. No longer on anticoagulation. He has been maintaining normal sinus rhythm No symptoms concerning for atrial fibrillation since that time  Echocardiogram showed ejection fraction 35-45%, mildly elevated right ventricular systolic pressures estimated at 30-40 mm mercury.  Previous catheterization showing occluded LAD in the mid vessel, 60% left main, 99% distal RCA who was sent to [name] in October 2010 for bypass surgery. He received 3 vessel bypass by [name] with a LIMA to the LAD, vein graft to the OM1 and vein graft to the PDA, with subsequent cardiac catheter March 2011 for chest discomfort showing occluded vein grafts x2 with patent LIMA. He has collateral vessels from left to right. Ejection fraction 55% mild anterolateral hypokinesis.  Past Medical History   Past Medical History  Diagnosis Date  . Coronary artery disease     a. 04/2009 CABG x 3 (LIMA->LAD, VG->OM1, VG->PDA);  b. 09/2009 Cath: occluded VG x 2 w/ patent LIMA and L->R collats. EF  55%, mild antlat HK;  c. 10/2011 MV: EF 53%, no isch/infarct-->low risk.  . Pure hypercholesterolemia   . Hypertension   . Atrial fibrillation ([place])     a. Dx 2013, recurred 02/2014, CHA2DS2VASc = 3 -->placed on Eliquis;  b. 02/2014 Echo: EF 50-55%, mid and apical anterior septum and mid and apical inf septum are abnl, mild to mod Ao sclerosis w/o AS.  Marland Kitchen History of arthritis   . Arthritis   . Chicken pox   . Rheumatic fever   . COPD (chronic obstructive pulmonary disease) ([place])   . Arthritis   . OSA (obstructive sleep apnea)     a. On CPAP  . Chronic lymphocytic leukemia ([place])     a. Dx 02/2014.    Past Surgical History  Procedure Laterality Date  . Cardiac catheterization  2011    Dr [name]  . Cholecystectomy    . Appendectomy  1988  . Tonsillectomy and adenoidectomy  1956  . Coronary artery bypass graft  2010    3v  . Hernia repair      abdominal  . Hernia repair       Allergies  No Known Allergies  Inpatient Medications    . amLODipine  5 mg Oral Daily  . apixaban  5 mg Oral BID  . atorvastatin  40 mg Oral QHS  . LORazepam  2 mg Intravenous Once  . mometasone-formoterol  2 puff Inhalation BID  . multivitamin with minerals  1 tablet Oral Daily  . sodium chloride flush  3 mL Intravenous Q12H  . tiotropium  18 mcg Inhalation QHS    Family History    Family History  Problem Relation Age of Onset  . Coronary artery disease      family history  . Heart disease Mother     Social History    Social History   Social History  . Marital Status: Married    Spouse Name: N/A  . Number of Children: N/A  . Years of Education: N/A   Occupational History  . retired     Research officer, trade union   Social History Main Topics  . Smoking status: Former Smoker -- 1.00 packs/day for 40 years    Types: Cigarettes    Quit date: 07/21/2006  . Smokeless tobacco: Never Used  . Alcohol Use: No     Comment: occasionally  . Drug Use: No  . Sexual Activity: No   Other  Topics Concern  . Not on file   Social History Narrative   Lives in [place] with wife. Dogs. Goats. Children - 4. Grandchildren - 7.      Worked - Sports coach, part time.   Diet - healthy   Exercise - very active      Service - [employer] - [place]     Review of Systems    General:  No chills, fever, night sweats or weight changes. +headache, resolved Cardiovascular:  No dyspnea on exertion, edema, orthopnea, palpitations, paroxysmal nocturnal dyspnea. No chest pain Dermatological: No rash, lesions/masses Respiratory: No cough, dyspnea Urologic: No hematuria, dysuria Abdominal:   No nausea, vomiting, diarrhea, bright red blood per rectum, melena, or hematemesis Neurologic:  No wkns, changes in mental status. All other systems reviewed and  are otherwise negative except as noted above.  Physical Exam    Blood pressure 149/66, pulse 52, temperature 98 F (36.7 C), temperature source Oral, resp. rate 20, height 5\' 9"  (1.753 m), weight 225 lb (102.059 kg), SpO2 93 %.  General: Pleasant, Caucasian male appearing in NAD Psych: Normal affect. Neuro: Alert and oriented X 3. Moves all extremities spontaneously. HEENT: Normal  Neck: Supple without bruits or JVD. Lungs:  Resp regular and unlabored, CTA without wheezing or rales. Heart: RRR no s3, s4, or murmurs. Abdomen: Soft, non-tender, non-distended, BS + x 4.  Extremities: No clubbing, cyanosis or edema. DP/PT/Radials 2+ and equal bilaterally.  Labs    Troponin [facility] Care Test)  Recent Labs  11/02/15 1130  TROPONINI <0.03   Lab Results  Component Value Date   WBC 6.1 11/03/2015   HGB 12.9* 11/03/2015   HCT 37.2* 11/03/2015   MCV 93.2 11/03/2015   PLT 124* 11/03/2015     Recent Labs Lab 11/02/15 1130 11/03/15 0650  NA 139 135  K 4.2 3.9  CL 108 106  CO2 27 23  BUN 17 11  CREATININE 1.05 0.87  CALCIUM 9.1 8.4*  PROT 6.9  --   BILITOT 1.3*  --   ALKPHOS 82  --   ALT 28  --   AST 21  --   GLUCOSE 123*  130*   Lab Results  Component Value Date   CHOL 142 08/23/2015   HDL 33* 08/23/2015   LDLCALC 76 08/23/2015   TRIG 167* 08/23/2015     Radiology Studies    CT head: IMPRESSION: 1. Atherosclerotic changes of both carotid bifurcations without significant stenosis. 2. 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 3. 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 4. Moderate tortuosity of the high cervical internal carotid arteries bilaterally without focal stenosis. There is mural calcification within both distal internal carotid arteries which could be related to previous vascular injury. No residual luminal stenosis is evident. 5. Minimal atherosclerotic calcifications of the cavernous internal carotid arteries bilaterally without significant stenosis. 6. Moderate mid and distal small vessel disease without significant proximal stenosis, aneurysm, or branch vessel occlusion within the circle of Willis.   EKG & Cardiac Imaging    EKG: Sinus bradycardia,   with no acute ST changes noted.   Echocardiogram: - Left ventricle: The cavity size was mildly dilated. There was mild concentric hypertrophy. Systolic function was normal. The estimated ejection fraction was in the range of 55% to 60%. Wall  motion was normal; there were no regional wall motion abnormalities. Left ventricular diastolic function parameters were normal. - Left atrium: The atrium was mildly dilated. - Right ventricle: Systolic function was normal. - Pulmonary arteries: Systolic pressure was within the normal  range.  Assessment & Plan    1. Severe hypertension  Cause of elevated pressure yesterday morning unclear, had not taken morning medicines  Has been having some higher blood pressures in the mornings, better in the daytime  --Agree with adding amlodipine 5 mg either in the morning or before bed  If pressures continue to run high, could increase this up to 10 mg  --Did  recommend for severe pressure, would take sublingual nitroglycerin   2. CAD, CABG - s/p CABG x3 in 2010 w/ LIMA-LAD, SVG-OM, SVG-PDA, occluded SVG x 2 by cath in 09/2009 myoview in 08/23/15: low risk No further workup needed  3. PAF  NSR since admission. - takes Amiodarone 100 BID with  metoprolol 25 BID On eliquis - This patients CHA2DS2-VASc Score and unadjusted Ischemic Stroke Rate (% per year) is equal to 3.2 % stroke rate/year from a score of 3 (HTN, Vascular, Age).  4. Bradycardia, pauses Most of his arrhythmia was overnight while sleeping . Was not wearing his  CPAP. No further workup needed, reassured the patient  And staff  5.  HLD - continue statin therapy  6. CLL - followed by Dr. Rogue Bussing - last treatment was 08/03/2015.  7. COPD - continue prior to admission medications - per admitting team  8. OSA - on CPAP.    Leighton Ruff,  11/03/2015, 12:25 PM

## 2015-11-03 NOTE — Progress Notes (Signed)
Patient d/c'd home. Education provided, no questions at this time. Patient picked up by wife. Telemetry removed. Anda Sobotta R [place]   

## 2015-11-03 NOTE — Progress Notes (Signed)
Pt. Ran SB throughout the night with pauses, the longest pause was 5.39. Pt. Was asymptomatic. Pt. Denied pain. No SOB or acute distress noted. Will continue to monitor pt.

## 2015-11-03 NOTE — Discharge Summary (Signed)
[facility] NAME: [name]    MR#:  [id number]  DATE OF BIRTH:  1946-05-17  DATE OF ADMISSION:  11/02/2015 ADMITTING PHYSICIAN: Gladstone Lighter, MD  DATE OF DISCHARGE: 11/03/2015  PRIMARY CARE PHYSICIAN: [name], MD    ADMISSION DIAGNOSIS:  Strokelike symptoms Hypertensive urgency  DISCHARGE DIAGNOSIS:  Stroke ruled out Hypertensive urgency improved Asymptomatic bradycardia  SECONDARY DIAGNOSIS:   Past Medical History  Diagnosis Date  . Coronary artery disease     a. 04/2009 CABG x 3 (LIMA->LAD, VG->OM1, VG->PDA);  b. 09/2009 Cath: occluded VG x 2 w/ patent LIMA and L->R collats. EF 55%, mild antlat HK;  c. 10/2011 MV: EF 53%, no isch/infarct-->low risk.  . Pure hypercholesterolemia   . Hypertension   . Atrial fibrillation ([place])     a. Dx 2013, recurred 02/2014, CHA2DS2VASc = 3 -->placed on Eliquis;  b. 02/2014 Echo: EF 50-55%, mid and apical anterior septum and mid and apical inf septum are abnl, mild to mod Ao sclerosis w/o AS.  Marland Kitchen History of arthritis   . Arthritis   . Chicken pox   . Rheumatic fever   . COPD (chronic obstructive pulmonary disease) ([place])   . Arthritis   . OSA (obstructive sleep apnea)     a. On CPAP  . Chronic lymphocytic leukemia ([place])     a. Dx 02/2014.    HOSPITAL COURSE:  [name]  is a 70 y.o. male admitted 11/02/2015 with chief complaint Code Stroke . Please see H&P performed by Gladstone Lighter, MD for further information. He was noted to have markedly elevated blood pressure on presentation to the hospital. This was accompanied by bradycardia. Overnight the patient had issues with bradycardia however was asymptomatic. At home the patient to double his dose of metoprolol which likely worsen his bradycardic episode he also did not have his CPAP machine. Cardiology consult with and followed throughout admission. Now that his blood pressure has improved his symptoms have also  improved. He'll be discharged on amlodipine in addition to his other medications. Of note recent tick exposure will continue with doxycycline  DISCHARGE CONDITIONS:   Stable  CONSULTS OBTAINED:  Treatment Team:  Wellington Hampshire, MD  DRUG ALLERGIES:  No Known Allergies  DISCHARGE MEDICATIONS:   Current Discharge Medication List    START taking these medications   Details  amLODipine (NORVASC) 5 MG tablet Take 1 tablet (5 mg total) by mouth daily. Qty: 30 tablet, Refills: 0    doxycycline (VIBRAMYCIN) 50 MG capsule Take 2 capsules (100 mg total) by mouth 2 (two) times daily. Qty: 20 capsule, Refills: 0      CONTINUE these medications which have NOT CHANGED   Details  amiodarone (PACERONE) 200 MG tablet Take 0.5 tablets (100 mg total) by mouth 2 (two) times daily. Qty: 30 tablet, Refills: 6    apixaban (ELIQUIS) 5 MG TABS tablet Take 1 tablet (5 mg total) by mouth 2 (two) times daily. Qty: 60 tablet, Refills: 6    atorvastatin (LIPITOR) 40 MG tablet Take 40 mg by mouth at bedtime.    budesonide-formoterol (SYMBICORT) 160-4.5 MCG/ACT inhaler Inhale 2 puffs into the lungs 2 (two) times daily.    Coenzyme Q10 (CO Q-10) 100 MG CAPS Take 100 mg by mouth at bedtime.     furosemide (LASIX) 20 MG tablet Take 20 mg by mouth 2 (two) times daily as needed for edema.    metoprolol tartrate (LOPRESSOR) 25 MG  tablet Take 1 tablet (25 mg total) by mouth 2 (two) times daily. Qty: 60 tablet, Refills: 3    Multiple Vitamin (MULTIVITAMIN WITH MINERALS) TABS tablet Take 1 tablet by mouth daily.    tiotropium (SPIRIVA) 18 MCG inhalation capsule Place 18 mcg into inhaler and inhale at bedtime.    traMADol (ULTRAM) 50 MG tablet Take 50 mg by mouth every 6 (six) hours as needed for moderate pain.    nitroGLYCERIN (NITROSTAT) 0.4 MG SL tablet Place 1 tablet (0.4 mg total) under the tongue every 5 (five) minutes as needed for chest pain. Qty: 25 tablet, Refills: 6         DISCHARGE  INSTRUCTIONS:    DIET:  Cardiac diet  DISCHARGE CONDITION:  Good  ACTIVITY:  Activity as tolerated  OXYGEN:  Home Oxygen: No.   Oxygen Delivery: room air  DISCHARGE LOCATION:  home   If you experience worsening of your admission symptoms, develop shortness of breath, life threatening emergency, suicidal or homicidal thoughts you must seek medical attention immediately by calling 911 or calling your MD immediately  if symptoms less severe.  You Must read complete instructions/literature along with all the possible adverse reactions/side effects for all the Medicines you take and that have been prescribed to you. Take any new Medicines after you have completely understood and accpet all the possible adverse reactions/side effects.   Please note  You were cared for by a hospitalist during your hospital stay. If you have any questions about your discharge medications or the care you received while you were in the hospital after you are discharged, you can call the unit and asked to speak with the hospitalist on call if the hospitalist that took care of you is not available. Once you are discharged, your primary care physician will handle any further medical issues. Please note that NO REFILLS for any discharge medications will be authorized once you are discharged, as it is imperative that you return to your primary care physician (or establish a relationship with a primary care physician if you do not have one) for your aftercare needs so that they can reassess your need for medications and monitor your lab values.    On the day of Discharge:   VITAL SIGNS:  Blood pressure 149/66, pulse 52, temperature 98 F (36.7 C), temperature source Oral, resp. rate 20, height 5\' 9"  (1.753 m), weight 102.059 kg (225 lb), SpO2 93 %.  I/O:   Intake/Output Summary (Last 24 hours) at 11/03/15 1255 Last data filed at 11/03/15 1139  Gross per 24 hour  Intake   1840 ml  Output   3290 ml  Net   -1450 ml    PHYSICAL EXAMINATION:  GENERAL:  70 y.o.-year-old patient lying in the bed with no acute distress.  EYES: Pupils equal, round, reactive to light and accommodation. No scleral icterus. Extraocular muscles intact.  HEENT: Head atraumatic, normocephalic. Oropharynx and nasopharynx clear.  NECK:  Supple, no jugular venous distention. No thyroid enlargement, no tenderness.  LUNGS: Normal breath sounds bilaterally, no wheezing, rales,rhonchi or crepitation. No use of accessory muscles of respiration.  CARDIOVASCULAR: S1, S2 normal. No murmurs, rubs, or gallops.  ABDOMEN: Soft, non-tender, non-distended. Bowel sounds present. No organomegaly or mass.  EXTREMITIES: No pedal edema, cyanosis, or clubbing.  NEUROLOGIC: Cranial nerves II through XII are intact. Muscle strength 5/5 in all extremities. Sensation intact. Gait not checked.  PSYCHIATRIC: The patient is alert and oriented x 3.  SKIN: No obvious rash, lesion,  or ulcer.   DATA REVIEW:   CBC  Recent Labs Lab 11/03/15 0650  WBC 6.1  HGB 12.9*  HCT 37.2*  PLT 124*    Chemistries   Recent Labs Lab 11/02/15 1130 11/03/15 0650  NA 139 135  K 4.2 3.9  CL 108 106  CO2 27 23  GLUCOSE 123* 130*  BUN 17 11  CREATININE 1.05 0.87  CALCIUM 9.1 8.4*  AST 21  --   ALT 28  --   ALKPHOS 82  --   BILITOT 1.3*  --     Cardiac Enzymes  Recent Labs Lab 11/02/15 1130  TROPONINI <0.03    Microbiology Results  Results for orders placed or performed in visit on 01/16/12  Fecal occult blood, imunochemical     Status: None   Collection Time: 01/16/12  1:29 PM  Result Value Ref Range Status   Fecal Occult Bld Negative Negative Final    RADIOLOGY:  Ct Angio Head W/cm &/or Wo Cm  11/02/2015  CLINICAL DATA:  Increased confusion and headaches. Stroke like symptoms. EXAM: CT ANGIOGRAPHY HEAD AND NECK TECHNIQUE: Multidetector CT imaging of the head and neck was performed using the standard protocol during bolus administration  of intravenous contrast. Multiplanar CT image reconstructions and MIPs were obtained to evaluate the vascular anatomy. Carotid stenosis measurements (when applicable) are obtained utilizing NASCET criteria, using the distal internal carotid diameter as the denominator. CONTRAST:  80 mL Isovue 370 COMPARISON:  CT head without contrast from the same day. CT head without contrast 08/22/2015. FINDINGS: CTA NECK Aortic arch: A 3 vessel arch configuration is present. Minimal calcifications are present without focal stenosis or aneurysm. Right carotid system: The right common carotid artery is within normal limits. Atherosclerotic calcifications are present at the carotid bifurcation there is no significant stenosis relative to the more distal vessel. Moderate tortuosity is present in the cervical right ICA just below the skullbase. There is also focal calcification in the wall at the skullbase. This could be related to remote injury. There is no significant luminal stenosis. Left carotid system: The left common carotid artery is within normal limits. The lumen is narrowed to 3 mm, without significant stenosis relative to the more distal vessel. Moderate tortuosity is evident just below the skullbase. There is no significant associated stenosis. Vertebral arteries:The vertebral arteries both originate from the subclavian arteries. There is moderate proximal stenosis on the left. There is mild tortuosity proximally in both vessels. The left vertebral artery is the dominant vessel. Focal wall calcification of the right vertebral artery at the C6 level is likely atherosclerotic. There is no focal stenosis or vascular injury to either vertebral artery otherwise. Skeleton: Large anterior osteophytes are present at C3-4 and C4-5. There is ankylosis across the disc space at C5-6. Osseous foraminal narrowing is evident at C3-4, C4-5, C5-6, and C6-7. Moderate facet hypertrophy is noted at C7-T1 and T1-2, contributing to osseous  foraminal narrowing. Degenerative changes are noted at C1-2. No focal lytic or blastic lesions are present. Vertebral body heights and alignment are maintained. The patient is status post median sternotomy. Other neck: No focal mucosal or submucosal lesions are present. The vocal cords are midline and symmetric. The thyroid is within normal limits. The submandibular and parotid glands are normal bilaterally. CTA HEAD Anterior circulation: Mild atherosclerotic calcifications are present within the cavernous internal carotid arteries bilaterally. Those toe with associated stenosis. The terminal ICA is within normal limits bilaterally. The A1 and M1 segments are within normal limits.  The anterior communicating artery is patent. The MCA bifurcations are intact. There is moderate narrowing of mid and distal small branch vessels. No significant proximal stenosis or occlusion is evident. Posterior circulation: The vertebral arteries are codominant at the skullbase. The PICA origins are visualized and normal. The basilar artery is within normal limits. Both posterior cerebral arteries originate from the basilar tip. There is mild attenuation of distal PCA branch vessels. Venous sinuses: The dural sinuses are patent. The right transverse sinus is dominant. The straight sinus and internal cerebral veins are patent. Cortical veins are intact. Anatomic variants: None. Delayed phase: The postcontrast images demonstrate no pathologic enhancement. IMPRESSION: 1. Atherosclerotic changes of both carotid bifurcations without significant stenosis. 2. 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 3. 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 4. Moderate tortuosity of the high cervical internal carotid arteries bilaterally without focal stenosis. There is mural calcification within both distal internal carotid arteries which could be related to previous vascular injury. No residual luminal stenosis is  evident. 5. Minimal atherosclerotic calcifications of the cavernous internal carotid arteries bilaterally without significant stenosis. 6. Moderate mid and distal small vessel disease without significant proximal stenosis, aneurysm, or branch vessel occlusion within the circle of Willis. Electronically Signed   By: San Morelle M.D.   On: 11/02/2015 12:49   Ct Head Wo Contrast  11/02/2015  CLINICAL DATA:  Increased confusion and headaches EXAM: CT HEAD WITHOUT CONTRAST TECHNIQUE: Contiguous axial images were obtained from the base of the skull through the vertex without intravenous contrast. COMPARISON:  08/22/2015 FINDINGS: Bony calvarium is intact. Mild atrophic changes are again identified stable from previous exam. No findings to suggest acute hemorrhage, acute infarction or space-occupying mass lesion are noted. Mild cavum septum pellucidum is seen. IMPRESSION: Atrophic and ischemic changes without acute abnormality. These results were called by telephone at the time of interpretation on 11/02/2015 at 11:18 am to Dr. Jacqualine Code, who verbally acknowledged these results. Electronically Signed   By: Inez Catalina M.D.   On: 11/02/2015 11:20   Ct Angio Neck W/cm &/or Wo/cm  11/02/2015  CLINICAL DATA:  Increased confusion and headaches. Stroke like symptoms. EXAM: CT ANGIOGRAPHY HEAD AND NECK TECHNIQUE: Multidetector CT imaging of the head and neck was performed using the standard protocol during bolus administration of intravenous contrast. Multiplanar CT image reconstructions and MIPs were obtained to evaluate the vascular anatomy. Carotid stenosis measurements (when applicable) are obtained utilizing NASCET criteria, using the distal internal carotid diameter as the denominator. CONTRAST:  80 mL Isovue 370 COMPARISON:  CT head without contrast from the same day. CT head without contrast 08/22/2015. FINDINGS: CTA NECK Aortic arch: A 3 vessel arch configuration is present. Minimal calcifications are present  without focal stenosis or aneurysm. Right carotid system: The right common carotid artery is within normal limits. Atherosclerotic calcifications are present at the carotid bifurcation there is no significant stenosis relative to the more distal vessel. Moderate tortuosity is present in the cervical right ICA just below the skullbase. There is also focal calcification in the wall at the skullbase. This could be related to remote injury. There is no significant luminal stenosis. Left carotid system: The left common carotid artery is within normal limits. The lumen is narrowed to 3 mm, without significant stenosis relative to the more distal vessel. Moderate tortuosity is evident just below the skullbase. There is no significant associated stenosis. Vertebral arteries:The vertebral arteries both originate from the subclavian arteries. There is moderate proximal  stenosis on the left. There is mild tortuosity proximally in both vessels. The left vertebral artery is the dominant vessel. Focal wall calcification of the right vertebral artery at the C6 level is likely atherosclerotic. There is no focal stenosis or vascular injury to either vertebral artery otherwise. Skeleton: Large anterior osteophytes are present at C3-4 and C4-5. There is ankylosis across the disc space at C5-6. Osseous foraminal narrowing is evident at C3-4, C4-5, C5-6, and C6-7. Moderate facet hypertrophy is noted at C7-T1 and T1-2, contributing to osseous foraminal narrowing. Degenerative changes are noted at C1-2. No focal lytic or blastic lesions are present. Vertebral body heights and alignment are maintained. The patient is status post median sternotomy. Other neck: No focal mucosal or submucosal lesions are present. The vocal cords are midline and symmetric. The thyroid is within normal limits. The submandibular and parotid glands are normal bilaterally. CTA HEAD Anterior circulation: Mild atherosclerotic calcifications are present within the  cavernous internal carotid arteries bilaterally. Those toe with associated stenosis. The terminal ICA is within normal limits bilaterally. The A1 and M1 segments are within normal limits. The anterior communicating artery is patent. The MCA bifurcations are intact. There is moderate narrowing of mid and distal small branch vessels. No significant proximal stenosis or occlusion is evident. Posterior circulation: The vertebral arteries are codominant at the skullbase. The PICA origins are visualized and normal. The basilar artery is within normal limits. Both posterior cerebral arteries originate from the basilar tip. There is mild attenuation of distal PCA branch vessels. Venous sinuses: The dural sinuses are patent. The right transverse sinus is dominant. The straight sinus and internal cerebral veins are patent. Cortical veins are intact. Anatomic variants: None. Delayed phase: The postcontrast images demonstrate no pathologic enhancement. IMPRESSION: 1. Atherosclerotic changes of both carotid bifurcations without significant stenosis. 2. 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 3. 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 4. Moderate tortuosity of the high cervical internal carotid arteries bilaterally without focal stenosis. There is mural calcification within both distal internal carotid arteries which could be related to previous vascular injury. No residual luminal stenosis is evident. 5. Minimal atherosclerotic calcifications of the cavernous internal carotid arteries bilaterally without significant stenosis. 6. Moderate mid and distal small vessel disease without significant proximal stenosis, aneurysm, or branch vessel occlusion within the circle of Willis. Electronically Signed   By: San Morelle M.D.   On: 11/02/2015 12:49     Management plans discussed with the patient, family and they are in agreement.  CODE STATUS:     Code Status Orders         Start     Ordered   11/02/15 1435  Full code   Continuous     11/02/15 1434    Code Status History    Date Active Date Inactive Code Status Order ID Comments User Context   08/22/2015  7:43 PM 08/23/2015  6:32 PM Full Code [id number]  Nicholes Mango, MD Inpatient    Advance Directive Documentation        Most Recent Value   Type of Advance Directive  Living will   Pre-existing out of facility DNR order (yellow form or pink MOST form)     "MOST" Form in Place?        TOTAL TIME TAKING CARE OF THIS PATIENT: 28 minutes.    Blakeley Scheier,  Karenann Cai.D on 11/03/2015 at 12:55 PM  Between 7am to 6pm - Pager - [phone]  After  6pm go to [email or web address] - Proofreader  [facility]  Office  [phone]  CC: Primary care physician; [name], MD

## 2015-11-03 NOTE — Progress Notes (Signed)
Pt. HR dropped to 29 and had a pause of 3.75. Pt. Alert and oriented with no acute distress noted.  Prime paged, awaiting call back no new orders at this time. Will continue to monitor pt.

## 2015-11-03 NOTE — Evaluation (Signed)
Physical Therapy Evaluation Patient Details Name: [name] MRN: [id number] DOB: [birth year] Today's Date: 11/03/2015   History of Present Illness  Pt had some R facial droop and general feeling of weakness. Feeling better but still not at 100% at time of eval.  Clinical Impression  Pt is able to ambulate around the nurses' station and up down 7 steps w/o safety issues.  He is a little slower than baseline and has some fatigue with the effort but ultimately is safe.  Pt is very eager to get out of the hospital and back home.  Per wife he is not quite yet back to his normal but she agrees he will be fine to go home w/o PT.     Follow Up Recommendations No PT follow up    Equipment Recommendations       Recommendations for Other Services       Precautions / Restrictions Precautions Precautions: Fall Restrictions Weight Bearing Restrictions: No      Mobility  Bed Mobility Overal bed mobility: Independent                Transfers Overall transfer level: Independent                  Ambulation/Gait Ambulation/Gait assistance: Independent Ambulation Distance (Feet): 250 Feet Assistive device: None       General Gait Details: Pt with mild baseline limp secondary to knee issues but is safe and steady with ambulation.  Per pt and wife he is walking slightly slower than normal but ultiamtely is not too far from baseline.   Stairs Stairs: Yes Stairs assistance: Modified independent (Device/Increase time) Stair Management: One rail Right Number of Stairs: 7 General stair comments: Pt able to negotiate up/down steps w/o assist.  He does use reciproval strategy.  Wheelchair Mobility    Modified Rankin (Stroke Patients Only)       Balance Overall balance assessment: Independent (pt did have a LOB back on to bed on initial standing, OSHT?)                                           Pertinent Vitals/Pain Pain Assessment: No/denies pain     Home Living Family/patient expects to be discharged to:: Private residence Living Arrangements: Spouse/significant other Available Help at Discharge: Family;Friend(s)   Home Access: Stairs to enter Entrance Stairs-Rails: Right Entrance Stairs-Number of Steps: 7   Home Equipment: None      Prior Function Level of Independence: Independent         Comments: 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     Hand Dominance        Extremity/Trunk Assessment   Upper Extremity Assessment: Overall WFL for tasks assessed           Lower Extremity Assessment: Overall WFL for tasks assessed (b/l knee arthritis with pain)         Communication   Communication: No difficulties  Cognition Arousal/Alertness: Awake/alert Behavior During Therapy: WFL for tasks assessed/performed Overall Cognitive Status: Within Functional Limits for tasks assessed                      General Comments      Exercises        Assessment/Plan    PT Assessment Patent does not need any further PT services  PT Diagnosis  Difficulty walking;Generalized weakness   PT Problem List    PT Treatment Interventions     PT Goals (Current goals can be found in the Care Plan section) Acute Rehab PT Goals Patient Stated Goal: "Get out of here" PT Goal Formulation: With patient Time For Goal Achievement: 11/10/15 Potential to Achieve Goals: Good    Frequency     Barriers to discharge        Co-evaluation               End of Session Equipment Utilized During Treatment: Gait belt Activity Tolerance: Patient tolerated treatment well Patient left: with chair alarm set;with call bell/phone within reach      Functional Assessment Tool Used: clinical judgement Functional Limitation: Mobility: Walking and moving around Mobility: Walking and Moving Around Current Status [id number]): At least 1 percent but less than 20 percent impaired, limited or restricted Mobility: Walking and  Moving Around Goal Status [phone]): At least 1 percent but less than 20 percent impaired, limited or restricted Mobility: Walking and Moving Around Discharge Status [phone]): At least 1 percent but less than 20 percent impaired, limited or restricted    Time: 1054-1120 PT Time Calculation (min) (ACUTE ONLY): 26 min   Charges:   PT Evaluation $PT Eval Low Complexity: 1 Procedure     PT G Codes:   PT G-Codes **NOT FOR INPATIENT CLASS** Functional Assessment Tool Used: clinical judgement Functional Limitation: Mobility: Walking and moving around Mobility: Walking and Moving Around Current Status [id number]): At least 1 percent but less than 20 percent impaired, limited or restricted Mobility: Walking and Moving Around Goal Status [phone]): At least 1 percent but less than 20 percent impaired, limited or restricted Mobility: Walking and Moving Around Discharge Status [phone]): At least 1 percent but less than 20 percent impaired, limited or restricted   Wayne Both, PT, DPT [phone]  Kreg Shropshire 11/03/2015, 1:58 PM

## 2015-11-03 NOTE — Progress Notes (Addendum)
Dr. Estanislado Pandy called back informed of pt. HR fluctuation, no new orders just to continue to monitor pt.  Hold all cardiac meds.

## 2015-11-05 ENCOUNTER — Telehealth: Payer: Self-pay

## 2015-11-05 NOTE — Telephone Encounter (Signed)
Pt's wife called in and made an appoint this Wednesday at 9 am with Dr. Lacinda Axon.

## 2015-11-05 NOTE — Telephone Encounter (Signed)
Transition Care Management Follow-up Telephone Call   Date discharged? 11/03/15  How have you been since you were released from the hospital? Still weak.  CPAP in use.  Recording blood pressures 3x daily, reports numbers within range.  Encouraged to bring log to upcoming appointment.  No pain.  No dizziness.  No headache.  Eating/drinking without issues.  Some SOB.    Do you understand why you were in the hospital? YES, I thought I was having a stroke and I was having some trouble breathing but it is hypertensive urgency.   Do you understand the discharge instructions? YES, moving slow between activities, rest and use CPAP, take all medications as prescribed.   Where were you discharged to? Home.   Items Reviewed:  Medications reviewed: YES, started taking amlodipine 5mg  and doxycycline 50mg  without issues.  Continuing all other scheduled medications as directed.  Allergies reviewed: YES, No known allergies.  Dietary changes reviewed: YES, cardiac diet, no problems.  Referrals reviewed: YES, appointment scheduled with primary care.   Functional Questionnaire:   Activities of Daily Living (ADLs):   He states they are independent in the following: Independent in all ADLs.   States they require assistance with the following: Does not require assistance at this time.   Any transportation issues/concerns?: No.   Any patient concerns? None at this time.    Confirmed importance and date/time of follow-up visits scheduled YES, appointment scheduled 11/07/15 at 9:00.  Provider Appointment booked with Dr. Lacinda Axon (lead physician).  PCP is currently out of the office with scheduled time off.  Confirmed with patient if condition begins to worsen call PCP or go to the ER.  Patient was given the office number and encouraged to call back with question or concerns.  : YES, patient verbalized understanding.

## 2015-11-05 NOTE — Telephone Encounter (Signed)
Called to follow up with transitional care management.  Patient received the call but could not hear me speaking to him.  Attempt was made x3.  Left a message for wife Sheria Lang) on designated number for her to return a call to the office so that I can follow up with how he is doing since discharge and make an appointment with a provider.  Hospital follow up appointment will be made with lead physician Dr. Lacinda Axon.  PCP has scheduled time off for the remainder of the week.

## 2015-11-07 ENCOUNTER — Ambulatory Visit (INDEPENDENT_AMBULATORY_CARE_PROVIDER_SITE_OTHER): Payer: Medicare HMO | Admitting: Family Medicine

## 2015-11-07 ENCOUNTER — Encounter: Payer: Self-pay | Admitting: Family Medicine

## 2015-11-07 VITALS — BP 150/98 | HR 58 | Temp 97.6°F | Ht 69.0 in | Wt 230.2 lb

## 2015-11-07 DIAGNOSIS — F4310 Post-traumatic stress disorder, unspecified: Secondary | ICD-10-CM

## 2015-11-07 DIAGNOSIS — I160 Hypertensive urgency: Secondary | ICD-10-CM | POA: Diagnosis not present

## 2015-11-07 DIAGNOSIS — R69 Illness, unspecified: Secondary | ICD-10-CM | POA: Diagnosis not present

## 2015-11-07 NOTE — Assessment & Plan Note (Signed)
Encouraged follow up with [place] if he continues to be dissatisfied with care/treatment at the [facility] [place].

## 2015-11-07 NOTE — Progress Notes (Signed)
Subjective:  Patient ID: [name], male    DOB: January 06, 1946  Age: 70 y.o. MRN: [id number]  CC: Hospital follow up  HPI:  70 year old male with a complicated PMH including CAD, hypertension, hyperlipidemia, COPD, paroxysmal A. fib, CLL, PTSD, CHF, TIA, PAD presents for hospital follow-up.  Patient's hospital course and discharge summary reviewed and summarized as below:  Patient presented with headache and right facial numbness and tingling.  Code stroke was called.  CT of the head was negative.  Blood pressures were found to be severely elevated with a systolic in the 123456. He was found to be bradycardic, heart rate in the 40s.  Patient was subsequently admitted given symptoms and severely elevated blood pressure.  Cardiology was consulted. Norvasc was added for his hypertension.  He did well during admission and was discharged in stable condition.  Patient resents today for follow-up. He states that he's doing fine. His blood pressures have been stable. His hospital discharge medication were reviewed and he endorses compliance.  Patient does report that he has recently been struggling with PTSD. He is seeing the [facility] for this and has had little improvement in his symptoms. He states that he's been dissatisfied with the care has been receiving at the [facility].  Social Hx   Social History   Social History  . Marital Status: Married    Spouse Name: N/A  . Number of Children: N/A  . Years of Education: N/A   Occupational History  . retired     Research officer, trade union   Social History Main Topics  . Smoking status: Former Smoker -- 1.00 packs/day for 40 years    Types: Cigarettes    Quit date: 07/21/2006  . Smokeless tobacco: Never Used  . Alcohol Use: No     Comment: occasionally  . Drug Use: No  . Sexual Activity: No   Other Topics Concern  . None   Social History Narrative   Lives in [place] with wife. Dogs. Goats. Children - 4. Grandchildren - 7.      Worked - Sports coach, part time.   Diet - healthy   Exercise - very active      Service - [employer] - [place]   Review of Systems  Respiratory: Positive for shortness of breath.   Cardiovascular: Negative for chest pain.  Psychiatric/Behavioral:       Trouble sleeping; struggling with PTSD.   Objective:  BP 150/98 mmHg  Pulse 58  Temp(Src) 97.6 F (36.4 C) (Oral)  Ht 5\' 9"  (1.753 m)  Wt 230 lb 4 oz (104.441 kg)  BMI 33.99 kg/m2  SpO2 99%  BP/Weight 11/07/2015 11/03/2015 [id number]  Systolic BP [id number] -  Diastolic BP 98 66 -  Wt. (Lbs) 230.25 - 225  BMI 33.99 - 33.21   Physical Exam  Constitutional: He appears well-developed. No distress.  HENT:  Head: Normocephalic and atraumatic.  Eyes: Conjunctivae are normal.  Cardiovascular: Regular rhythm.  Bradycardia present.   Pulmonary/Chest: Effort normal. He has no wheezes. He has no rales.  Neurological: He is alert.  Psychiatric:  Depressed mood.  Vitals reviewed.  Lab Results  Component Value Date   WBC 6.1 11/03/2015   HGB 12.9* 11/03/2015   HCT 37.2* 11/03/2015   PLT 124* 11/03/2015   GLUCOSE 130* 11/03/2015   CHOL 142 08/23/2015   TRIG 167* 08/23/2015   HDL 33* 08/23/2015   LDLCALC 76 08/23/2015   ALT 28 11/02/2015   AST 21 11/02/2015  NA 135 11/03/2015   K 3.9 11/03/2015   CL 106 11/03/2015   CREATININE 0.87 11/03/2015   BUN 11 11/03/2015   CO2 23 11/03/2015   TSH 1.06 03/08/2014   PSA 1.49 07/06/2014   INR 1.24 11/02/2015   HGBA1C  05/08/2009    6.0 (NOTE) The ADA recommends the following therapeutic goal for glycemic control related to Hgb A1c measurement: Goal of therapy: <6.5 Hgb A1c  Reference: American Diabetes Association: Clinical Practice Recommendations 2010, Diabetes Care, 2010, 33: (Suppl  1).    Assessment & Plan:   Problem List Items Addressed This Visit    PTSD (post-traumatic stress disorder)    Encouraged follow up with [place] if he continues to be dissatisfied with care/treatment at  the [place].      Hypertensive urgency [facility] course and discharge summary reviewed. Patient was admitted for strokelike symptoms and found to have hypertensive urgency (per documentation; sounds more like hypertensive emergency). Log of home BP's reveal that they are well controlled. Patient to continue Norvasc, Metoprolol, Lasix. Follow up closely with PCP and cardiology.         Follow-up: PRN  [place]

## 2015-11-07 NOTE — Progress Notes (Signed)
Pre visit review using our clinic review tool, if applicable. No additional management support is needed unless otherwise documented below in the visit note. 

## 2015-11-07 NOTE — Assessment & Plan Note (Signed)
Hospital course and discharge summary reviewed. Patient was admitted for strokelike symptoms and found to have hypertensive urgency (per documentation; sounds more like hypertensive emergency). Log of home BP's reveal that they are well controlled. Patient to continue Norvasc, Metoprolol, Lasix. Follow up closely with PCP and cardiology.

## 2015-11-07 NOTE — Patient Instructions (Signed)
Continue your current medications.  BP looks great.  Be sure to follow up with Dr. Rockey Situ.  Let us know if we can help regarding your PTSD.  Take care   Dr. Lacinda Axon

## 2015-11-09 ENCOUNTER — Ambulatory Visit (INDEPENDENT_AMBULATORY_CARE_PROVIDER_SITE_OTHER): Payer: Medicare HMO | Admitting: Pulmonary Disease

## 2015-11-09 ENCOUNTER — Other Ambulatory Visit
Admission: RE | Admit: 2015-11-09 | Discharge: 2015-11-09 | Disposition: A | Discharge status: Home / Self Care | Payer: Medicare HMO | Source: Ambulatory Visit | Attending: Pulmonary Disease | Admitting: Pulmonary Disease

## 2015-11-09 ENCOUNTER — Other Ambulatory Visit: Payer: Self-pay

## 2015-11-09 ENCOUNTER — Encounter: Payer: Self-pay | Admitting: Pulmonary Disease

## 2015-11-09 ENCOUNTER — Telehealth: Payer: Self-pay

## 2015-11-09 VITALS — BP 112/70 | HR 47 | Ht 69.0 in | Wt 230.0 lb

## 2015-11-09 DIAGNOSIS — J449 Chronic obstructive pulmonary disease, unspecified: Secondary | ICD-10-CM | POA: Diagnosis not present

## 2015-11-09 DIAGNOSIS — G4761 Periodic limb movement disorder: Secondary | ICD-10-CM

## 2015-11-09 DIAGNOSIS — G4733 Obstructive sleep apnea (adult) (pediatric): Secondary | ICD-10-CM | POA: Diagnosis not present

## 2015-11-09 DIAGNOSIS — M170 Bilateral primary osteoarthritis of knee: Secondary | ICD-10-CM | POA: Diagnosis not present

## 2015-11-09 LAB — FERRITIN: FERRITIN: 96 ng/mL (ref 24–336)

## 2015-11-09 MED ORDER — MIRTAZAPINE 15 MG PO TABS: 15.0000 mg | ORAL_TABLET | Freq: Every day | ORAL | Status: DC

## 2015-11-09 NOTE — Progress Notes (Signed)
PULMONARY OFFICE PROGRESS NOTE  Date of initial consultation: 06/28/15 Reason for consultation: Dyspnea, history of COPD (previously managed by Dr Lake Bells)   PROBLEMS: COPD - Mild obstruction by PFTs 07/2015 OSA - CPAP initiated 07/2015 PLMS -   Initial HPI (06/27/05):  34 M with history of COPD, previosuly managed by Dr Lake Bells (last seen approx 2 yrs ago). Referred by Dr Rockey Situ for re-evaluation of chronic exertional dyspnea. Pt reports progressive dyspnea over the past year. He has occasional chest tightness. He has chronic cough with occasional morning mucus described as "phlegm". Denies F/C/S, hemoptysis, pleuritic CP, orthopnea, PND, LE edema and calf tenderness. He has been prescribed Symbicort which he is using as a rescue inhaler and albuterol which he doesn't use at all. He is also noted to be on amiodarone. In addition, he reports that his wife has noted frequent nocturnal apneas. His sleep quality is poor whic he attributes to arthritis pain. He has moderate daytime sleepiness but has never fallen asleep at critical times such as while driving. IMP/PLAN (06/27/05): COPD, unspecified; Probable OSA. Counseled re: proper use of his inhaler medications. Symbicort is to be 2 inhalations BID every day and albuterol MDI is to be used as a rescue inhaler. A polysomnogram has been ordered to investigate likely OSA. 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 Probable mild obstruction (FEV1 70% pred) with gas trapping (RV 134% pred) and mild-mod decrease in DLCO PSG (07/25/15): Overall AHI < 10/hr but > 100/hr in supine position. ROV (07/30/15): Dyspnea on exertion is moderately improved with scheduled use of Symbicort. He is also on Spiriva, PRN albuterol with rare use of albuterol MDI. PSG results not available. Continue same COPD regimen CPAP titration (08/29/15): recommend full mask with CPAP 8 cm H2O  Events  since last visit: Admitted 4/14 - 11/03/15 with hypertensive urgency  SUBJ (11/09/15): No new complaints. COPD/respiratory status are stable. He initially had difficulty getting used to CPAP but is doing better with it and wearing it approx 5-6 hrs per night. He feels more rested. He has recently been started on mirtazapine for PTSD and insomnia    Filed Vitals:   11/09/15 0855  BP: 112/70  Pulse: 47  Height: 5\' 9"  (1.753 m)  Weight: 230 lb (104.327 kg)  SpO2: 98%    EXAM:  Gen: WDWN, NAD HEENT: NCAT, oropharynx normal Neck: Supple without JVD Lungs: breath sounds decreased, no wheezes Cardiovascular: Reg rhythm, rate normal, soft systolic murmur  Abdomen: Soft, nontender, normal BS Ext: No C/C/E Neuro: no focal deficits noted  DATA:  Sleep study from 07/2015 reviewed  BMP Latest Ref Rng 11/03/2015 11/02/2015 10/01/2015  Glucose 65 - 99 mg/dL 130(H) 123(H) 136(H)  BUN 6 - 20 mg/dL 11 17 17   Creatinine 0.61 - 1.24 mg/dL 0.87 1.05 1.10  Sodium 135 - 145 mmol/L 135 139 139  Potassium 3.5 - 5.1 mmol/L 3.9 4.2 3.8  Chloride 101 - 111 mmol/L 106 108 107  CO2 22 - 32 mmol/L 23 27 26   Calcium 8.9 - 10.3 mg/dL 8.4(L) 9.1 8.7(L)    CBC Latest Ref Rng 11/03/2015 11/02/2015 10/01/2015  WBC 3.8 - 10.6 K/uL 6.1 7.7 7.3  Hemoglobin 13.0 - 18.0 g/dL 12.9(L) 13.4 14.1  Hematocrit 40.0 - 52.0 % 37.2(L) 38.8(L) 39.7(L)  Platelets 150 - 440 K/uL 124(L) 139(L) 141(L)     IMPRESSION:   Chronic obstructive pulmonary disease - well controlled. OSA (obstructive  sleep apnea) - AHI only 10 overall but > 100/hr when supine Periodic limb movement disorder - The PSG does not document how often associated with arousals   PLAN:  Cont Symbicort, Spiriva and PRN albuterol Check ferritin level as part of eval for PLMS ROV 3 months or sooner as needed   Outpatient Encounter Prescriptions as of 11/09/2015  Medication Sig  . amiodarone (PACERONE) 200 MG tablet Take 0.5 tablets (100 mg total) by mouth  2 (two) times daily.  Marland Kitchen amLODipine (NORVASC) 5 MG tablet Take 1 tablet (5 mg total) by mouth daily.  Marland Kitchen apixaban (ELIQUIS) 5 MG TABS tablet Take 1 tablet (5 mg total) by mouth 2 (two) times daily.  Marland Kitchen atorvastatin (LIPITOR) 40 MG tablet Take 40 mg by mouth at bedtime.  . budesonide-formoterol (SYMBICORT) 160-4.5 MCG/ACT inhaler Inhale 2 puffs into the lungs 2 (two) times daily.  . Coenzyme Q10 (CO Q-10) 100 MG CAPS Take 100 mg by mouth at bedtime.   Marland Kitchen doxycycline (VIBRAMYCIN) 50 MG capsule Take 2 capsules (100 mg total) by mouth 2 (two) times daily.  . furosemide (LASIX) 20 MG tablet Take 20 mg by mouth 2 (two) times daily as needed for edema.  . metoprolol tartrate (LOPRESSOR) 25 MG tablet Take 1 tablet (25 mg total) by mouth 2 (two) times daily.  . Multiple Vitamin (MULTIVITAMIN WITH MINERALS) TABS tablet Take 1 tablet by mouth daily.  . nitroGLYCERIN (NITROSTAT) 0.4 MG SL tablet Place 1 tablet (0.4 mg total) under the tongue every 5 (five) minutes as needed for chest pain.  Marland Kitchen tiotropium (SPIRIVA) 18 MCG inhalation capsule Place 18 mcg into inhaler and inhale at bedtime.  . traMADol (ULTRAM) 50 MG tablet Take 50 mg by mouth every 6 (six) hours as needed for moderate pain.   No facility-administered encounter medications on file as of 11/09/2015.    Merton Border, MD PCCM service Mobile [phone] Pager [phone] 11/11/2015

## 2015-11-09 NOTE — Telephone Encounter (Signed)
-----   Message from Arie Sabina sent at 11/09/2015  8:46 AM EDT ----- Regarding: tcm/ph Dr. Rockey Situ  11/15/15 @ 2:20

## 2015-11-09 NOTE — Telephone Encounter (Signed)
Patient contacted regarding discharge from [facility] on 11/03/15.  Patient understands to follow up with [facility] on 11/15/15 at 2:20 at [facility]. Patient understands discharge instructions? yes Patient understands medications and regiment? yes Patient understands to bring all medications to this visit? yes

## 2015-11-11 DIAGNOSIS — G4733 Obstructive sleep apnea (adult) (pediatric): Secondary | ICD-10-CM | POA: Diagnosis not present

## 2015-11-12 ENCOUNTER — Encounter: Payer: Self-pay | Admitting: Pulmonary Disease

## 2015-11-15 ENCOUNTER — Ambulatory Visit (INDEPENDENT_AMBULATORY_CARE_PROVIDER_SITE_OTHER): Payer: Medicare HMO | Admitting: Cardiovascular Disease

## 2015-11-15 ENCOUNTER — Encounter: Payer: Self-pay | Admitting: Cardiovascular Disease

## 2015-11-15 VITALS — BP 114/50 | HR 50 | Ht 69.0 in | Wt 231.5 lb

## 2015-11-15 DIAGNOSIS — I5032 Chronic diastolic (congestive) heart failure: Secondary | ICD-10-CM

## 2015-11-15 DIAGNOSIS — I10 Essential (primary) hypertension: Secondary | ICD-10-CM

## 2015-11-15 DIAGNOSIS — I4891 Unspecified atrial fibrillation: Secondary | ICD-10-CM

## 2015-11-15 DIAGNOSIS — Z9989 Dependence on other enabling machines and devices: Secondary | ICD-10-CM

## 2015-11-15 DIAGNOSIS — G4733 Obstructive sleep apnea (adult) (pediatric): Secondary | ICD-10-CM

## 2015-11-15 DIAGNOSIS — I480 Paroxysmal atrial fibrillation: Secondary | ICD-10-CM

## 2015-11-15 NOTE — Assessment & Plan Note (Signed)
Maintaining normal sinus rhythm We'll continue on his current medications, tolerating anticoagulation Asymptomatic with heart rate of 50

## 2015-11-15 NOTE — Assessment & Plan Note (Signed)
Recommended he take Lasix for any leg edema or abdominal bloating

## 2015-11-15 NOTE — Patient Instructions (Signed)
You are doing well. No medication changes were made.  When you feel bloated, take a water pill (furosemide)  Please call us if you have new issues that need to be addressed before your next appt.  Your physician wants you to follow-up in: 6 months.  You will receive a reminder letter in the mail two months in advance. If you don't receive a letter, please call our office to schedule the follow-up appointment.

## 2015-11-15 NOTE — Assessment & Plan Note (Signed)
He is tolerating CPAP Sleeping better

## 2015-11-15 NOTE — Assessment & Plan Note (Signed)
Blood pressure is well controlled on today's visit. No changes made to the medications. 

## 2015-11-15 NOTE — Progress Notes (Signed)
Patient ID: [name], male    DOB: [birth year], 70 y.o.   MRN: [id number]  HPI Comments: [name] is a 70 year old gentleman with a history of smoking, coronary artery disease, cabg, Last catheterization March 2011 with occluded vein graft 2, patent LIMA, collaterals from left to right, atrial fibrillation March 2013, who presents for follow-up of his coronary artery disease and atrial fibrillation. 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 Converted back to normal with metoprolol. Possible conversion pause.  In follow-up today, he reports feeling well, heart rate stable 47 up to 60 bpm Denies significant shortness of breath on exertion though does have mild COPD He has been seen at the [facility] for PTSD, reports feeling well, stable He is taking Lasix periodically for abdominal bloating, shortness of breath with improvement of his symptoms  Lab work reviewed with him showing cholesterol at goal  EKG on today's visit shows sinus bradycardia with ventricular rate 50 bpm, no significant ST or T-wave changes  Other past medical history reviewed  atrial fibrillation/flutter starting September 12 2015, then persistent flutter In the clinic his heart rate was 126 bpm Amiodarone dosing was increased, amlodipine was changed to diltiazem 120 mg daily, metoprolol increased up to 25 mill grams twice a day Converted to normal sinus rhythm at home   tachycardia concerning for atrial fibrillation 05/10/2015. Lasted only several minutes Rate possibly up to 140 bpm.  Previously has been very active, building a barn, tool shed, taking care of animals  some benefits from the [place] for agent orange exposure and his cancer   total cholesterol 137, LDL 86, earlier in the year  chronic leg pain since he was young.  Previous leg pain with Crestor   admitted to the hospital on September 26 2011 for malaise, appearing pale, irregular heart rhythm and  noted to be in atrial fibrillation. He was given medication for rhythm control and he converted to normal sinus rhythm later that evening. No longer on anticoagulation. He has been maintaining normal sinus rhythm No symptoms concerning for atrial fibrillation since that time  Echocardiogram showed ejection fraction 35-45%, mildly elevated right ventricular systolic pressures estimated at 30-40 mm mercury.  Previous catheterization showing occluded LAD in the mid vessel, 60% left main, 99% distal RCA who was sent to [facility] in October 2010 for bypass surgery. He received 3 vessel bypass by Dr.Gerhardt with a LIMA to the LAD, vein graft to the OM1 and vein graft to the PDA, with subsequent cardiac catheter March 2011 for chest discomfort showing occluded vein grafts x2 with patent LIMA. He has collateral vessels from left to right. Ejection fraction 55% mild anterolateral hypokinesis.  No Known Allergies  Current Outpatient Prescriptions on File Prior to Visit  Medication Sig Dispense Refill  . amiodarone (PACERONE) 200 MG tablet Take 0.5 tablets (100 mg total) by mouth 2 (two) times daily. 30 tablet 6  . amLODipine (NORVASC) 5 MG tablet Take 1 tablet (5 mg total) by mouth daily. 30 tablet 0  . apixaban (ELIQUIS) 5 MG TABS tablet Take 1 tablet (5 mg total) by mouth 2 (two) times daily. 60 tablet 6  . atorvastatin (LIPITOR) 40 MG tablet Take 40 mg by mouth at bedtime.    . budesonide-formoterol (SYMBICORT) 160-4.5 MCG/ACT inhaler Inhale 2 puffs into the lungs 2 (two) times daily.    . Coenzyme Q10 (CO Q-10) 100 MG CAPS Take 100 mg by mouth  at bedtime.     . furosemide (LASIX) 20 MG tablet Take 20 mg by mouth 2 (two) times daily as needed for edema.    . metoprolol tartrate (LOPRESSOR) 25 MG tablet Take 1 tablet (25 mg total) by mouth 2 (two) times daily. 60 tablet 3  . mirtazapine (REMERON) 15 MG tablet Take 1 tablet (15 mg total) by mouth at bedtime.    . Multiple Vitamin (MULTIVITAMIN WITH  MINERALS) TABS tablet Take 1 tablet by mouth daily.    . nitroGLYCERIN (NITROSTAT) 0.4 MG SL tablet Place 1 tablet (0.4 mg total) under the tongue every 5 (five) minutes as needed for chest pain. 25 tablet 6  . tiotropium (SPIRIVA) 18 MCG inhalation capsule Place 18 mcg into inhaler and inhale at bedtime.    . traMADol (ULTRAM) 50 MG tablet Take 50 mg by mouth every 6 (six) hours as needed for moderate pain.    . [DISCONTINUED] isosorbide mononitrate (IMDUR) 30 MG 24 hr tablet Take 1 tablet (30 mg total) by mouth daily. 30 tablet 6   No current facility-administered medications on file prior to visit.    Past Medical History  Diagnosis Date  . Coronary artery disease     a. 04/2009 CABG x 3 (LIMA->LAD, VG->OM1, VG->PDA);  b. 09/2009 Cath: occluded VG x 2 w/ patent LIMA and L->R collats. EF 55%, mild antlat HK;  c. 10/2011 MV: EF 53%, no isch/infarct-->low risk.  . Pure hypercholesterolemia   . Hypertension   . Atrial fibrillation ([place])     a. Dx 2013, recurred 02/2014, CHA2DS2VASc = 3 -->placed on Eliquis;  b. 02/2014 Echo: EF 50-55%, mid and apical anterior septum and mid and apical inf septum are abnl, mild to mod Ao sclerosis w/o AS.  Marland Kitchen History of arthritis   . Arthritis   . Chicken pox   . Rheumatic fever   . COPD (chronic obstructive pulmonary disease) ([place])   . Arthritis   . OSA (obstructive sleep apnea)     a. On CPAP  . Chronic lymphocytic leukemia ([place])     a. Dx 02/2014.    Past Surgical History  Procedure Laterality Date  . Cardiac catheterization  2011    Dr Fletcher Anon  . Cholecystectomy    . Appendectomy  1988  . Tonsillectomy and adenoidectomy  1956  . Coronary artery bypass graft  2010    3v  . Hernia repair      abdominal  . Hernia repair      Social History  reports that he quit smoking about 9 years ago. His smoking use included Cigarettes. He has a 40 pack-year smoking history. He has never used smokeless tobacco. He reports that he does not drink alcohol or use  illicit drugs.  Family History family history includes Heart disease in his mother.   Review of Systems  Constitutional: Negative.   Eyes: Negative.   Respiratory: Negative.   Cardiovascular: Negative.   Gastrointestinal: Negative.   Endocrine: Negative.   Musculoskeletal: Positive for arthralgias.  Neurological: Negative.   Psychiatric/Behavioral: Negative.   All other systems reviewed and are negative.  BP 114/50 mmHg  Pulse 50  Ht 5\' 9"  (1.753 m)  Wt 231 lb 8 oz (105.008 kg)  BMI 34.17 kg/m2  Physical Exam  Constitutional: He is oriented to person, place, and time. He appears well-developed and well-nourished.  HENT:  Head: Normocephalic.  Nose: Nose normal.  Mouth/Throat: Oropharynx is clear and moist.  Eyes: Conjunctivae are normal. Pupils are equal,  round, and reactive to light.  Neck: Normal range of motion. Neck supple. No JVD present.  Cardiovascular: Regular rhythm, S1 normal, S2 normal, normal heart sounds and intact distal pulses.  Exam reveals no gallop and no friction rub.   No murmur heard. Pulmonary/Chest: Effort normal. No respiratory distress. He has no wheezes. He has no rales. He exhibits no tenderness.  Abdominal: Soft. Bowel sounds are normal. He exhibits no distension. There is no tenderness.  Musculoskeletal: Normal range of motion. He exhibits no edema or tenderness.  Lymphadenopathy:    He has no cervical adenopathy.  Neurological: He is alert and oriented to person, place, and time. Coordination normal.  Skin: Skin is warm and dry. No rash noted. No erythema.  Psychiatric: He has a normal mood and affect. His behavior is normal. Judgment and thought content normal.      Assessment and Plan   Nursing note and vitals reviewed.

## 2015-11-20 ENCOUNTER — Other Ambulatory Visit: Payer: Self-pay | Admitting: *Deleted

## 2015-11-20 ENCOUNTER — Telehealth: Payer: Self-pay | Admitting: Cardiovascular Disease

## 2015-11-20 MED ORDER — AMLODIPINE BESYLATE 5 MG PO TABS: 5.0000 mg | ORAL_TABLET | Freq: Every day | ORAL | Status: DC

## 2015-11-20 NOTE — Telephone Encounter (Signed)
Pt calling stating he is having some issues with medication getting it refilled Pt was in hospital another provider prescribed this medication. Pt is out and needs more please send this in.   *STAT* If patient is at the pharmacy, call can be transferred to refill team.   1. Which medications need to be refilled? (please list name of each medication and dose if known) Amlodipine 5 mg  2. Which pharmacy/location (including street and city if local pharmacy) is medication to be sent to? [place]  [facility]   3. Do they need a 30 day or 90 day supply? 90 day

## 2015-11-20 NOTE — Telephone Encounter (Signed)
Requested Prescriptions   Signed Prescriptions Disp Refills  . amLODipine (NORVASC) 5 MG tablet 90 tablet 3    Sig: Take 1 tablet (5 mg total) by mouth daily.    Authorizing Provider: GOLLAN, TIMOTHY J    Ordering User: LOPEZ, MARINA C    

## 2015-12-03 ENCOUNTER — Inpatient Hospital Stay (HOSPITAL_BASED_OUTPATIENT_CLINIC_OR_DEPARTMENT_OTHER): Payer: Medicare HMO | Admitting: Internal Medicine

## 2015-12-03 ENCOUNTER — Inpatient Hospital Stay: Payer: Medicare HMO | Attending: Internal Medicine

## 2015-12-03 VITALS — BP 138/69 | HR 39 | Temp 98.8°F | Resp 18 | Wt 235.9 lb

## 2015-12-03 DIAGNOSIS — R1310 Dysphagia, unspecified: Secondary | ICD-10-CM | POA: Diagnosis not present

## 2015-12-03 DIAGNOSIS — Z87891 Personal history of nicotine dependence: Secondary | ICD-10-CM | POA: Insufficient documentation

## 2015-12-03 DIAGNOSIS — J449 Chronic obstructive pulmonary disease, unspecified: Secondary | ICD-10-CM

## 2015-12-03 DIAGNOSIS — I2510 Atherosclerotic heart disease of native coronary artery without angina pectoris: Secondary | ICD-10-CM | POA: Diagnosis not present

## 2015-12-03 DIAGNOSIS — Z9221 Personal history of antineoplastic chemotherapy: Secondary | ICD-10-CM

## 2015-12-03 DIAGNOSIS — I4891 Unspecified atrial fibrillation: Secondary | ICD-10-CM | POA: Diagnosis not present

## 2015-12-03 DIAGNOSIS — I10 Essential (primary) hypertension: Secondary | ICD-10-CM | POA: Insufficient documentation

## 2015-12-03 DIAGNOSIS — E7800 Pure hypercholesterolemia, unspecified: Secondary | ICD-10-CM

## 2015-12-03 DIAGNOSIS — Z7901 Long term (current) use of anticoagulants: Secondary | ICD-10-CM

## 2015-12-03 DIAGNOSIS — C9110 Chronic lymphocytic leukemia of B-cell type not having achieved remission: Secondary | ICD-10-CM

## 2015-12-03 DIAGNOSIS — Z79899 Other long term (current) drug therapy: Secondary | ICD-10-CM | POA: Diagnosis not present

## 2015-12-03 DIAGNOSIS — M129 Arthropathy, unspecified: Secondary | ICD-10-CM | POA: Insufficient documentation

## 2015-12-03 DIAGNOSIS — G4730 Sleep apnea, unspecified: Secondary | ICD-10-CM | POA: Insufficient documentation

## 2015-12-03 LAB — CBC WITH DIFFERENTIAL/PLATELET
BASOS ABS: 0.1 10*3/uL (ref 0–0.1)
BASOS PCT: 1 %
EOS ABS: 0.2 10*3/uL (ref 0–0.7)
Eosinophils Relative: 2 %
HEMATOCRIT: 37.6 % — AB (ref 40.0–52.0)
Hemoglobin: 13.1 g/dL (ref 13.0–18.0)
Lymphocytes Relative: 19 %
Lymphs Abs: 1.4 10*3/uL (ref 1.0–3.6)
MCH: 32.7 pg (ref 26.0–34.0)
MCHC: 35 g/dL (ref 32.0–36.0)
MCV: 93.5 fL (ref 80.0–100.0)
MONO ABS: 0.9 10*3/uL (ref 0.2–1.0)
Monocytes Relative: 12 %
NEUTROS ABS: 4.8 10*3/uL (ref 1.4–6.5)
Neutrophils Relative %: 66 %
PLATELETS: 120 10*3/uL — AB (ref 150–440)
RBC: 4.02 MIL/uL — ABNORMAL LOW (ref 4.40–5.90)
RDW: 14.6 % — AB (ref 11.5–14.5)
WBC: 7.2 10*3/uL (ref 3.8–10.6)

## 2015-12-03 LAB — COMPREHENSIVE METABOLIC PANEL
ALBUMIN: 4.1 g/dL (ref 3.5–5.0)
ALT: 31 U/L (ref 17–63)
AST: 20 U/L (ref 15–41)
Alkaline Phosphatase: 77 U/L (ref 38–126)
Anion gap: 6 (ref 5–15)
BILIRUBIN TOTAL: 1 mg/dL (ref 0.3–1.2)
BUN: 16 mg/dL (ref 6–20)
CHLORIDE: 107 mmol/L (ref 101–111)
CO2: 26 mmol/L (ref 22–32)
Calcium: 8.5 mg/dL — ABNORMAL LOW (ref 8.9–10.3)
Creatinine, Ser: 1.03 mg/dL (ref 0.61–1.24)
GFR calc Af Amer: >60 mL/min (ref >60)
GFR calc non Af Amer: >60 mL/min (ref >60)
GLUCOSE: 123 mg/dL — AB (ref 65–99)
POTASSIUM: 3.8 mmol/L (ref 3.5–5.1)
SODIUM: 139 mmol/L (ref 135–145)
Total Protein: 6.4 g/dL — ABNORMAL LOW (ref 6.5–8.1)

## 2015-12-03 LAB — LACTATE DEHYDROGENASE: LDH: 135 U/L (ref 98–192)

## 2015-12-03 NOTE — Progress Notes (Signed)
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  MARCH 2017- CT N/C/A/P- NED. 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   Patient complains of intermittent choking. Complains of intermittent difficulty swallowing or pain with swallowing/mostly in the throat.   Patient's appetite is good. He denies any weight loss. Denies any profuse night sweats.  Denies any frequent infections. Denies any chest pain or cough.  REVIEW OF SYSTEMS:  A complete 10 point review of system is done which is negative except mentioned above/history of present illness.   PAST MEDICAL HISTORY :  Past Medical History  Diagnosis Date  . Coronary artery disease     a. 04/2009 CABG x 3 (LIMA->LAD, VG->OM1, VG->PDA);  b. 09/2009 Cath: occluded VG x 2 w/ patent LIMA and L->R collats. EF 55%, mild antlat HK;  c. 10/2011 MV: EF 53%, no isch/infarct-->low risk.  . Pure hypercholesterolemia   . Hypertension   . Atrial fibrillation ([place])     a. Dx 2013, recurred 02/2014, CHA2DS2VASc = 3 -->placed on Eliquis;  b. 02/2014 Echo: EF 50-55%, mid and apical anterior septum and mid and apical inf septum are abnl, mild to mod Ao sclerosis w/o AS.  Marland Kitchen History of arthritis   . Arthritis   . Chicken pox   . Rheumatic fever   . COPD (chronic obstructive pulmonary disease) ([place])   . Arthritis   . OSA (obstructive sleep apnea)     a. On CPAP  . Chronic lymphocytic leukemia ([place])     a. Dx 02/2014.    PAST SURGICAL HISTORY :   Past Surgical History  Procedure Laterality Date  . Cardiac  catheterization  2011    Dr Fletcher Anon  . Cholecystectomy    . Appendectomy  1988  . Tonsillectomy and adenoidectomy  1956  . Coronary artery bypass graft  2010    3v  . Hernia repair      abdominal  . Hernia repair      FAMILY HISTORY :   Family History  Problem Relation Age of Onset  . Coronary artery disease      family history  . Heart disease Mother     SOCIAL HISTORY:   Social History  Substance Use Topics  . Smoking status: Former Smoker -- 1.00 packs/day for 40 years    Types: Cigarettes    Quit date: 07/21/2006  . Smokeless tobacco: Never Used  . Alcohol Use: No     Comment: occasionally    ALLERGIES:  has No Known Allergies.  MEDICATIONS:  Current Outpatient Prescriptions  Medication Sig Dispense Refill  . amiodarone (PACERONE) 200 MG tablet Take 0.5 tablets (100 mg total) by mouth 2 (two) times daily. 30 tablet 6  . amLODipine (NORVASC) 5 MG tablet Take 1 tablet (5 mg total) by mouth daily. 90 tablet 3  . apixaban (ELIQUIS) 5 MG TABS tablet Take 1 tablet (5 mg total) by mouth 2 (two) times daily. 60 tablet 6  . atorvastatin (LIPITOR) 40 MG tablet Take 40 mg by mouth at bedtime.    . budesonide-formoterol (SYMBICORT) 160-4.5 MCG/ACT inhaler  Inhale 2 puffs into the lungs 2 (two) times daily.    . Coenzyme Q10 (CO Q-10) 100 MG CAPS Take 100 mg by mouth at bedtime.     . furosemide (LASIX) 20 MG tablet Take 20 mg by mouth 2 (two) times daily as needed for edema.    . metoprolol tartrate (LOPRESSOR) 25 MG tablet Take 1 tablet (25 mg total) by mouth 2 (two) times daily. 60 tablet 3  . mirtazapine (REMERON) 15 MG tablet Take 1 tablet (15 mg total) by mouth at bedtime.    . Multiple Vitamin (MULTIVITAMIN WITH MINERALS) TABS tablet Take 1 tablet by mouth daily.    . nitroGLYCERIN (NITROSTAT) 0.4 MG SL tablet Place 1 tablet (0.4 mg total) under the tongue every 5 (five) minutes as needed for chest pain. 25 tablet 6  . tiotropium (SPIRIVA) 18 MCG inhalation capsule Place 18  mcg into inhaler and inhale at bedtime.    . traMADol (ULTRAM) 50 MG tablet Take 50 mg by mouth every 6 (six) hours as needed for moderate pain.    . [DISCONTINUED] isosorbide mononitrate (IMDUR) 30 MG 24 hr tablet Take 1 tablet (30 mg total) by mouth daily. 30 tablet 6   No current facility-administered medications for this visit.    PHYSICAL EXAMINATION: ECOG PERFORMANCE STATUS: 0 - Asymptomatic  BP 138/69 mmHg  Pulse 39  Temp(Src) 98.8 F (37.1 C) (Tympanic)  Resp 18  Wt 235 lb 14.3 oz (107 kg)  Filed Weights   12/03/15 1048  Weight: 235 lb 14.3 oz (107 kg)    GENERAL: Well-nourished well-developed; Alert, no distress and comfortable. He is with his wife.  EYES: no pallor or icterus OROPHARYNX: no thrush or ulceration; dentures.  NECK: supple, no masses felt LYMPH:  no palpable lymphadenopathy in the cervical, axillary or inguinal regions LUNGS: clear to auscultation and  No wheeze or crackles HEART/CVS: regular rate & rhythm and no murmurs; No lower extremity edema ABDOMEN:abdomen soft, non-tender and normal bowel sounds Musculoskeletal:no cyanosis of digits and no clubbing  PSYCH: alert & oriented x 3 with fluent speech NEURO: no focal motor/sensory deficits SKIN:  no rashes or significant lesions  LABORATORY DATA:  I have reviewed the data as listed    Component Value Date/Time   NA 139 12/03/2015 0934   NA 139 10/11/2014 1800   K 3.8 12/03/2015 0934   K 3.3* 10/11/2014 1800   CL 107 12/03/2015 0934   CL 106 10/11/2014 1800   CO2 26 12/03/2015 0934   CO2 27 10/11/2014 1800   GLUCOSE 123* 12/03/2015 0934   GLUCOSE 107* 10/11/2014 1800   BUN 16 12/03/2015 0934   BUN 15 10/11/2014 1800   CREATININE 1.03 12/03/2015 0934   CREATININE 0.89 10/11/2014 1800   CALCIUM 8.5* 12/03/2015 0934   CALCIUM 8.8* 10/11/2014 1800   PROT 6.4* 12/03/2015 0934   PROT 6.4* 10/11/2014 1800   PROT 6.9 09/09/2013 1151   ALBUMIN 4.1 12/03/2015 0934   ALBUMIN 4.1 10/11/2014 1800    ALBUMIN 4.8 09/09/2013 1151   AST 20 12/03/2015 0934   AST 23 10/11/2014 1800   ALT 31 12/03/2015 0934   ALT 22 10/11/2014 1800   ALKPHOS 77 12/03/2015 0934   ALKPHOS 61 10/11/2014 1800   BILITOT 1.0 12/03/2015 0934   BILITOT 0.9 10/11/2014 1800   GFRNONAA >60 12/03/2015 0934   GFRNONAA >60 10/11/2014 1800   GFRNONAA >60 08/15/2014 1205   GFRAA >60 12/03/2015 0934   GFRAA >60 10/11/2014 1800  GFRAA >60 08/15/2014 1205    No results found for: SPEP, UPEP  Lab Results  Component Value Date   WBC 7.2 12/03/2015   NEUTROABS 4.8 12/03/2015   HGB 13.1 12/03/2015   HCT 37.6* 12/03/2015   MCV 93.5 12/03/2015   PLT 120* 12/03/2015      Chemistry      Component Value Date/Time   NA 139 12/03/2015 0934   NA 139 10/11/2014 1800   K 3.8 12/03/2015 0934   K 3.3* 10/11/2014 1800   CL 107 12/03/2015 0934   CL 106 10/11/2014 1800   CO2 26 12/03/2015 0934   CO2 27 10/11/2014 1800   BUN 16 12/03/2015 0934   BUN 15 10/11/2014 1800   CREATININE 1.03 12/03/2015 0934   CREATININE 0.89 10/11/2014 1800      Component Value Date/Time   CALCIUM 8.5* 12/03/2015 0934   CALCIUM 8.8* 10/11/2014 1800   ALKPHOS 77 12/03/2015 0934   ALKPHOS 61 10/11/2014 1800   AST 20 12/03/2015 0934   AST 23 10/11/2014 1800   ALT 31 12/03/2015 0934   ALT 22 10/11/2014 1800   BILITOT 1.0 12/03/2015 0934   BILITOT 0.9 10/11/2014 1800       ASSESSMENT & PLAN:   # CLL/SLL- status post bendamustine rituximab 6 cycles [finished in April 2016]- s/ maintenance rituximab every 2 months until March 2017. CT scan March 2017 shows- no evidence of any lymphadenopathy in the neck/chest /abdomen pelvis; no splenomegaly.   # CBC within normal limits except for slightly low platelet count of 120. CMP within normal limits. Recommend continue surveillance at this time.  # Dysphagia/ possible choking as per patient recommend follow up with PCP/ will need ENT evaluation/modified barium swallow.  # I would  recommend follow-up in 3 months/CBC CMP and LDH.    Cammie Sickle, MD 12/03/2015 11:12 AM

## 2015-12-03 NOTE — Progress Notes (Signed)
Patient has been diagnosed with PTSD since last visit. Also now has CPAP for sleep apnea.  States his legs hurt all the time 8/10.  Not eligible for knee replacement.

## 2015-12-11 DIAGNOSIS — G4733 Obstructive sleep apnea (adult) (pediatric): Secondary | ICD-10-CM | POA: Diagnosis not present

## 2015-12-19 ENCOUNTER — Ambulatory Visit (INDEPENDENT_AMBULATORY_CARE_PROVIDER_SITE_OTHER): Payer: Medicare HMO | Admitting: Pulmonary Disease

## 2015-12-19 ENCOUNTER — Encounter: Payer: Self-pay | Admitting: Pulmonary Disease

## 2015-12-19 VITALS — BP 112/62 | HR 42 | Ht 68.0 in | Wt 234.0 lb

## 2015-12-19 DIAGNOSIS — G4733 Obstructive sleep apnea (adult) (pediatric): Secondary | ICD-10-CM

## 2015-12-19 DIAGNOSIS — G4761 Periodic limb movement disorder: Secondary | ICD-10-CM | POA: Diagnosis not present

## 2015-12-19 DIAGNOSIS — J449 Chronic obstructive pulmonary disease, unspecified: Secondary | ICD-10-CM | POA: Diagnosis not present

## 2015-12-19 DIAGNOSIS — R001 Bradycardia, unspecified: Secondary | ICD-10-CM

## 2015-12-19 NOTE — Patient Instructions (Signed)
Change metoprolol to 12.5 mg (1/2 tablet) in morning and 25 mg @ HS Follow up in 4 months or sooner as needed

## 2015-12-19 NOTE — Progress Notes (Signed)
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 Referred by Dr Rockey Situ for re-evaluation of chronic exertional dyspnea. Pt reports progressive dyspnea over the past year. He has occasional chest tightness. He has chronic cough with occasional morning mucus described as "phlegm". Denies F/C/S, hemoptysis, pleuritic CP, orthopnea, PND, LE edema and calf tenderness. He has been prescribed Symbicort which he is using as a rescue inhaler and albuterol which he doesn't use at all. He is also noted to be on amiodarone. In addition, he reports that his wife has noted frequent nocturnal apneas. His sleep quality is poor whic he attributes to arthritis pain. He has moderate daytime sleepiness but has never fallen asleep at critical times such as while driving. IMP/PLAN (06/27/05): COPD, unspecified; Probable OSA. Counseled re: proper use of his inhaler medications. Symbicort is to be 2 inhalations BID every day and albuterol MDI is to be used as a rescue inhaler. A polysomnogram has been ordered to investigate likely OSA. 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 Probable mild obstruction (FEV1 70% pred) with gas trapping (RV 134% pred) and mild-mod decrease in DLCO PSG (07/25/15): Overall AHI < 10/hr but > 100/hr in supine position. ROV (07/30/15): Dyspnea on exertion is moderately improved with scheduled use of Symbicort. He is also on Spiriva, PRN albuterol with rare use of albuterol MDI. PSG results not available. Continue same COPD regimen CPAP titration (08/29/15): recommend full mask with CPAP 8 cm H2O   Events  since last visit: Started on mirtazapine for PTSD under care of [facility].  SUBJ (11/09/15): NSC in respiratory symptoms. Uses albuterol rescue MDI 1-2 times per day. Denies CP, fever, purulent sputum, hemoptysis, LE edema and calf tenderness. Compliant with CPAP and feels that sleep quality has improved. Started on mirtazapine by [facility] for PTSD. This helps him fall asleep but he still awakens with leg jerks. Does not wish to start any more medications.    Current outpatient prescriptions:  .  amiodarone (PACERONE) 200 MG tablet, Take 0.5 tablets (100 mg total) by mouth 2 (two) times daily., Disp: 30 tablet, Rfl: 6 .  amLODipine (NORVASC) 5 MG tablet, Take 1 tablet (5 mg total) by mouth daily., Disp: 90 tablet, Rfl: 3 .  apixaban (ELIQUIS) 5 MG TABS tablet, Take 1 tablet (5 mg total) by mouth 2 (two) times daily., Disp: 60 tablet, Rfl: 6 .  atorvastatin (LIPITOR) 40 MG tablet, Take 40 mg by mouth at bedtime., Disp: , Rfl:  .  budesonide-formoterol (SYMBICORT) 160-4.5 MCG/ACT inhaler, Inhale 2 puffs into the lungs 2 (two) times daily., Disp: , Rfl:  .  Coenzyme Q10 (CO Q-10) 100 MG CAPS, Take 100 mg by mouth at bedtime. , Disp: , Rfl:  .  furosemide (LASIX) 20 MG tablet, Take 20 mg by mouth 2 (two) times daily as needed for edema., Disp: , Rfl:  .  metoprolol tartrate (LOPRESSOR) 25 MG tablet, Take 1 tablet (25 mg total) by mouth 2 (two) times daily., Disp: 60 tablet, Rfl: 3 .  mirtazapine (REMERON) 15 MG tablet, Take 1 tablet (15 mg total) by mouth at bedtime., Disp: , Rfl:  .  Multiple Vitamin (MULTIVITAMIN WITH MINERALS) TABS tablet, Take 1 tablet by mouth daily., Disp: , Rfl:  .  nitroGLYCERIN (NITROSTAT) 0.4 MG SL tablet, Place 1 tablet (0.4 mg total) under the tongue every 5 (five) minutes as needed for chest pain., Disp: 25 tablet, Rfl: 6 .  tiotropium (SPIRIVA) 18 MCG inhalation capsule, Place 18 mcg into inhaler and inhale at bedtime., Disp: , Rfl:  .  traMADol (ULTRAM) 50 MG tablet, Take 50 mg  by mouth every 6 (six) hours as needed for moderate pain., Disp: , Rfl:  .  [DISCONTINUED] isosorbide mononitrate (IMDUR) 30 MG 24 hr tablet, Take 1 tablet (30 mg total) by mouth daily., Disp: 30 tablet, Rfl: 6    Filed Vitals:   12/19/15 0848  BP: 112/62  Pulse: 42  Height: 5\' 8"  (1.727 Doyle)  Weight: 234 lb (106.142 kg)  SpO2: 97%    EXAM:  Gen: WDWN, NAD HEENT: NCAT, oropharynx normal Neck: Supple without JVD Lungs: breath sounds decreased, no wheezes Cardiovascular: Loletha Grayer, reg, no Doyle noted  Abdomen: Soft, nontender, normal BS Ext: No C/C/E Neuro: no focal deficits noted  DATA:  Ferritin: 96 (normal)  BMP Latest Ref Rng 12/03/2015 11/03/2015 11/02/2015  Glucose 65 - 99 mg/dL 123(H) 130(H) 123(H)  BUN 6 - 20 mg/dL 16 11 17   Creatinine 0.61 - 1.24 mg/dL 1.03 0.87 1.05  Sodium 135 - 145 mmol/L 139 135 139  Potassium 3.5 - 5.1 mmol/L 3.8 3.9 4.2  Chloride 101 - 111 mmol/L 107 106 108  CO2 22 - 32 mmol/L 26 23 27   Calcium 8.9 - 10.3 mg/dL 8.5(L) 8.4(L) 9.1    CBC Latest Ref Rng 12/03/2015 11/03/2015 11/02/2015  WBC 3.8 - 10.6 K/uL 7.2 6.1 7.7  Hemoglobin 13.0 - 18.0 g/dL 13.1 12.9(L) 13.4  Hematocrit 40.0 - 52.0 % 37.6(L) 37.2(L) 38.8(L)  Platelets 150 - 440 K/uL 120(L) 124(L) 139(L)    IMPRESSION:   Chronic obstructive pulmonary disease - well controlled. OSA (obstructive sleep apnea) - compliance report indicates good compliance. He reports improved sleep PLMD - normal ferritin. He wishes to avoid more medications Bradycardia - discussed with Dr Rockey Situ. Pt has had difficulty with with recurrent AFRVR  PLAN:  Cont Symbicort, Spiriva and PRN albuterol Continue CPAP as ordered No specific therapy for PLMD Change metoprolol to 12.5 mg (1/2 tablet) in morning and 25 mg @ HS ROV 4 months or sooner as needed   Merton Border, MD PCCM service Mobile [phone] Pager [phone] 12/19/2015

## 2016-01-01 ENCOUNTER — Ambulatory Visit (INDEPENDENT_AMBULATORY_CARE_PROVIDER_SITE_OTHER): Payer: Medicare HMO | Admitting: Internal Medicine

## 2016-01-01 ENCOUNTER — Encounter: Payer: Self-pay | Admitting: Internal Medicine

## 2016-01-01 VITALS — BP 138/62 | HR 48 | Ht 68.0 in | Wt 232.4 lb

## 2016-01-01 DIAGNOSIS — J432 Centrilobular emphysema: Secondary | ICD-10-CM

## 2016-01-01 DIAGNOSIS — R69 Illness, unspecified: Secondary | ICD-10-CM | POA: Diagnosis not present

## 2016-01-01 DIAGNOSIS — I10 Essential (primary) hypertension: Secondary | ICD-10-CM | POA: Diagnosis not present

## 2016-01-01 DIAGNOSIS — F4310 Post-traumatic stress disorder, unspecified: Secondary | ICD-10-CM | POA: Diagnosis not present

## 2016-01-01 NOTE — Progress Notes (Signed)
Subjective:    Patient ID: [name], male    DOB: [birth year], 70 y.o.   MRN: [id number]  HPI  70YO male presents for follow up.  Recently seen for issues with hypertension. Followed up with cardiology 4/27 and BP was well controlled.  HTN - BP at home 140/60 to 119/50 at home. Feeling well. No CP, palpitations.  PTSD - Started treatment at the [facility]. Using CBT. Started on "sleep medication" remeron. Falling asleep better.  Wt Readings from Last 3 Encounters:  01/01/16 232 lb 6.4 oz (105.416 kg)  12/19/15 234 lb (106.142 kg)  12/03/15 235 lb 14.3 oz (107 kg)   BP Readings from Last 3 Encounters:  01/01/16 138/62  12/19/15 112/62  12/03/15 138/69    Past Medical History  Diagnosis Date  . Coronary artery disease     a. 04/2009 CABG x 3 (LIMA->LAD, VG->OM1, VG->PDA);  b. 09/2009 Cath: occluded VG x 2 w/ patent LIMA and L->R collats. EF 55%, mild antlat HK;  c. 10/2011 MV: EF 53%, no isch/infarct-->low risk.  . Pure hypercholesterolemia   . Hypertension   . Atrial fibrillation ([place])     a. Dx 2013, recurred 02/2014, CHA2DS2VASc = 3 -->placed on Eliquis;  b. 02/2014 Echo: EF 50-55%, mid and apical anterior septum and mid and apical inf septum are abnl, mild to mod Ao sclerosis w/o AS.  Marland Kitchen History of arthritis   . Arthritis   . Chicken pox   . Rheumatic fever   . COPD (chronic obstructive pulmonary disease) ([place])   . Arthritis   . OSA (obstructive sleep apnea)     a. On CPAP  . Chronic lymphocytic leukemia ([place])     a. Dx 02/2014.   Family History  Problem Relation Age of Onset  . Coronary artery disease      family history  . Heart disease Mother    Past Surgical History  Procedure Laterality Date  . Cardiac catheterization  2011    Dr Fletcher Anon  . Cholecystectomy    . Appendectomy  1988  . Tonsillectomy and adenoidectomy  1956  . Coronary artery bypass graft  2010    3v  . Hernia repair      abdominal  . Hernia repair     Social History   Social History  .  Marital Status: Married    Spouse Name: N/A  . Number of Children: N/A  . Years of Education: N/A   Occupational History  . retired     Research officer, trade [employer]   Social History Main Topics  . Smoking status: Former Smoker -- 1.00 packs/day for 40 years    Types: Cigarettes    Quit date: 07/21/2006  . Smokeless tobacco: Never Used  . Alcohol Use: No     Comment: occasionally  . Drug Use: No  . Sexual Activity: No   Other Topics Concern  . None   Social History Narrative   Lives in [place] with wife. Dogs. Goats. Children - 4. Grandchildren - 7.      Worked - Sports coach, part time.   Diet - healthy   Exercise - very active      Service - [employer] - [place]    Review of Systems  Constitutional: Negative for fever, chills, activity change, appetite change, fatigue and unexpected weight change.  Eyes: Negative for visual disturbance.  Respiratory: Negative for cough, chest tightness and shortness of breath.   Cardiovascular: Negative for chest pain, palpitations and leg swelling.  Gastrointestinal: Negative for abdominal pain, diarrhea, constipation and abdominal distention.  Genitourinary: Negative for dysuria, urgency and difficulty urinating.  Musculoskeletal: Negative for arthralgias and gait problem.  Skin: Negative for color change and rash.  Hematological: Negative for adenopathy.  Psychiatric/Behavioral: Negative for sleep disturbance and dysphoric mood. The patient is not nervous/anxious.        Objective:    BP 138/62 mmHg  Pulse 48  Ht 5\' 8"  (1.727 m)  Wt 232 lb 6.4 oz (105.416 kg)  BMI 35.34 kg/m2  SpO2 94% Physical Exam  Constitutional: He is oriented to person, place, and time. He appears well-developed and well-nourished. No distress.  HENT:  Head: Normocephalic and atraumatic.  Right Ear: External ear normal.  Left Ear: External ear normal.  Nose: Nose normal.  Mouth/Throat: Oropharynx is clear and moist. No oropharyngeal exudate.  Eyes:  Conjunctivae and EOM are normal. Pupils are equal, round, and reactive to light. Right eye exhibits no discharge. Left eye exhibits no discharge. No scleral icterus.  Neck: Normal range of motion. Neck supple. No tracheal deviation present. No thyromegaly present.  Cardiovascular: Regular rhythm.  Bradycardia present.  Exam reveals no gallop and no friction rub.   Murmur heard.  Systolic murmur is present  Pulmonary/Chest: Effort normal and breath sounds normal. No accessory muscle usage. No tachypnea and no bradypnea. No respiratory distress. He has no decreased breath sounds. He has no wheezes. He has no rhonchi. He has no rales. He exhibits no tenderness.  Musculoskeletal: Normal range of motion. He exhibits no edema.  Lymphadenopathy:    He has no cervical adenopathy.  Neurological: He is alert and oriented to person, place, and time. No cranial nerve deficit. Coordination normal.  Skin: Skin is warm and dry. No rash noted. He is not diaphoretic. No erythema. No pallor.  Psychiatric: He has a normal mood and affect. His behavior is normal. Judgment and thought content normal.          Assessment & Plan:   Problem List Items Addressed This Visit      Unprioritized   COPD (chronic obstructive pulmonary disease) ([place])    Symptomatically, doing well. Continue Symbicort and Spiriva.      HYPERTENSION, BENIGN - Primary    BP Readings from Last 3 Encounters:  01/01/16 138/62  12/19/15 112/62  12/03/15 138/69   BP well controlled. Recent renal function stable. Will continue current medication.      PTSD (post-traumatic stress disorder)    Started new program at the [facility]. Encouraged him to continue with this. Continue Mirtazapine at bedtime.          Return in about 6 months (around 07/02/2016) for Recheck.  Ronette Deter, MD Internal Medicine [facility]

## 2016-01-01 NOTE — Assessment & Plan Note (Signed)
Started new program at the [facility]. Encouraged him to continue with this. Continue Mirtazapine at bedtime.

## 2016-01-01 NOTE — Patient Instructions (Signed)
Continue current medication.  Follow up in 6 months.

## 2016-01-01 NOTE — Progress Notes (Signed)
Pre visit review using our clinic review tool, if applicable. No additional management support is needed unless otherwise documented below in the visit note. 

## 2016-01-01 NOTE — Assessment & Plan Note (Signed)
BP Readings from Last 3 Encounters:  01/01/16 138/62  12/19/15 112/62  12/03/15 138/69   BP well controlled. Recent renal function stable. Will continue current medication.

## 2016-01-01 NOTE — Assessment & Plan Note (Signed)
Symptomatically, doing well. Continue Symbicort and Spiriva.

## 2016-01-08 ENCOUNTER — Telehealth: Payer: Self-pay | Admitting: Cardiovascular Disease

## 2016-01-08 NOTE — Telephone Encounter (Signed)
Spoke w/ pt.  He reports increased feet & leg swelling for the past week. Attempted to discuss pt's diet, but he insists he needs an appt to discuss w/ Dr. Rockey Situ.  Pt reports "I eat what I usually eat", has always drank a lot of water, but is "not peeing that much". He has increased his lasix up to 80 mg at one time each day w/ little relief.  Pt does not wear compression hose and does not elevate feet. Advised pt to limit his fluids and sodium, he reports that he has to drink 2/2 dry mouth.  Advised pt to try ice chips to keep his mouth moist and avoid the volume of fluids. Discussed w/ pt the purpose of compression hose and utilizing gravity to try to get fluid back into his blood stream in order to be able to pee it out. Pt became very quiet and states "that makes sense". Sched pt to see Dr. Rockey Situ on 6/26 @ 8:40, he will call back if his sx improve.

## 2016-01-08 NOTE — Telephone Encounter (Signed)
Pt calling stating he is having some swelling Pt c/o swelling: STAT is pt has developed SOB within 24 hours  1. How long have you been experiencing swelling? A week   2. Where is the swelling located? Feet and legs   3.  Are you currently taking a "fluid pill"? Yes states he's tripled what he is to take but getting worst.   4.  Are you currently SOB? No   5.  Have you traveled recently? No

## 2016-01-11 ENCOUNTER — Ambulatory Visit (INDEPENDENT_AMBULATORY_CARE_PROVIDER_SITE_OTHER): Payer: Medicare HMO | Admitting: Cardiovascular Disease

## 2016-01-11 ENCOUNTER — Encounter: Payer: Self-pay | Admitting: Cardiovascular Disease

## 2016-01-11 VITALS — BP 114/60 | HR 51 | Ht 69.0 in | Wt 231.0 lb

## 2016-01-11 DIAGNOSIS — I25701 Atherosclerosis of coronary artery bypass graft(s), unspecified, with angina pectoris with documented spasm: Secondary | ICD-10-CM | POA: Diagnosis not present

## 2016-01-11 DIAGNOSIS — R0602 Shortness of breath: Secondary | ICD-10-CM

## 2016-01-11 DIAGNOSIS — I10 Essential (primary) hypertension: Secondary | ICD-10-CM

## 2016-01-11 DIAGNOSIS — I480 Paroxysmal atrial fibrillation: Secondary | ICD-10-CM | POA: Diagnosis not present

## 2016-01-11 DIAGNOSIS — G4733 Obstructive sleep apnea (adult) (pediatric): Secondary | ICD-10-CM | POA: Diagnosis not present

## 2016-01-11 DIAGNOSIS — R600 Localized edema: Secondary | ICD-10-CM

## 2016-01-11 DIAGNOSIS — I5032 Chronic diastolic (congestive) heart failure: Secondary | ICD-10-CM | POA: Diagnosis not present

## 2016-01-11 MED ORDER — METOLAZONE 5 MG PO TABS: 5.0000 mg | ORAL_TABLET | Freq: Every day | ORAL | Status: DC | PRN

## 2016-01-11 NOTE — Progress Notes (Signed)
Patient ID: NIC LERNER, male   DOB: 1945/10/11, 70 y.o.   MRN: [id number] Cardiology Office Note  Date:  01/11/2016   ID:  BAYLEE WHITEHALL, DOB 16-Feb-1946, MRN [id number]  PCP:  [name], MD   Chief Complaint  Patient presents with  . other    C/o slow heart rate, sob and Edema. Meds reviewed verbally with pt.    HPI:  Mr. Hieb is a 70 year old gentleman with a history of smoking, coronary artery disease, cabg, Last catheterization March 2011 with occluded vein graft 2, patent LIMA, collaterals from left to right, atrial fibrillation March 2013, who presents for follow-up of his coronary artery disease and atrial fibrillation. 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 Converted back to normal with metoprolol. Possible conversion pause.  In follow-up today, reports he is doing well except for new lower extremity swelling that is pitting over the past several weeks. Denies any change in his salt or fluid intake. No new medications that he can think of. Worse in the right leg and the left but both are swollen, sore, he is itching Tries to put his feet up but it does not help very much  Working with the [facility] for PTSD, reports feeling well, stable Reports that he is been compliant with his Lasix, taking 80 mg daily which is higher dose than before for the past 5 days, no improvement in his leg swelling   EKG on today's visit shows sinus bradycardia with ventricular rate 51 bpm, nonspecific ST abnormality  Other past medical history reviewed atrial fibrillation/flutter starting September 12 2015, then persistent flutter In the clinic his heart rate was 126 bpm Amiodarone dosing was increased, amlodipine was changed to diltiazem 120 mg daily, metoprolol increased up to 25 mill grams twice a day Converted to normal sinus rhythm at home  tachycardia concerning for atrial fibrillation 05/10/2015. Lasted only  several minutes Rate possibly up to 140 bpm.  Previously has been very active, building a barn, tool shed, taking care of animals some benefits from the [place] for agent orange exposure and his cancer  total cholesterol 137, LDL 86, earlier in the year  chronic leg pain since he was young.  Previous leg pain with Crestor  admitted to the hospital on September 26 2011 for malaise, appearing pale, irregular heart rhythm and noted to be in atrial fibrillation. He was given medication for rhythm control and he converted to normal sinus rhythm later that evening. No longer on anticoagulation. He has been maintaining normal sinus rhythm No symptoms concerning for atrial fibrillation since that time  Echocardiogram showed ejection fraction 35-45%, mildly elevated right ventricular systolic pressures estimated at 30-40 mm mercury.  Previous catheterization showing occluded LAD in the mid vessel, 60% left main, 99% distal RCA who was sent to [facility] in October 2010 for bypass surgery. He received 3 vessel bypass by Dr.Gerhardt with a LIMA to the LAD, vein graft to the OM1 and vein graft to the PDA, with subsequent cardiac catheter March 2011 for chest discomfort showing occluded vein grafts x2 with patent LIMA. He has collateral vessels from left to right. Ejection fraction 55% mild anterolateral hypokinesis.  PMH:   has a past medical history of Coronary artery disease; Pure hypercholesterolemia; Hypertension; Atrial fibrillation ([place]); History of arthritis; Arthritis; Chicken pox; Rheumatic fever; COPD (chronic obstructive pulmonary disease) ([place]); Arthritis; OSA (obstructive sleep apnea); and Chronic lymphocytic leukemia ([place]).  PSH:  Past Surgical History  Procedure Laterality Date  . Cardiac catheterization  2011    Dr Fletcher Anon  . Cholecystectomy    . Appendectomy  1988  . Tonsillectomy and adenoidectomy  1956  . Coronary artery bypass graft  2010    3v  . Hernia repair      abdominal  .  Hernia repair      Current Outpatient Prescriptions  Medication Sig Dispense Refill  . amiodarone (PACERONE) 200 MG tablet Take 0.5 tablets (100 mg total) by mouth 2 (two) times daily. 30 tablet 6  . apixaban (ELIQUIS) 5 MG TABS tablet Take 1 tablet (5 mg total) by mouth 2 (two) times daily. 60 tablet 6  . atorvastatin (LIPITOR) 40 MG tablet Take 40 mg by mouth at bedtime.    . budesonide-formoterol (SYMBICORT) 160-4.5 MCG/ACT inhaler Inhale 2 puffs into the lungs 2 (two) times daily.    . Coenzyme Q10 (CO Q-10) 100 MG CAPS Take 100 mg by mouth at bedtime.     . furosemide (LASIX) 20 MG tablet Take 80 mg by mouth daily.     . metoprolol tartrate (LOPRESSOR) 25 MG tablet Take 1 tablet (25 mg total) by mouth 2 (two) times daily. (Patient taking differently: Take 25 mg by mouth daily. ) 60 tablet 3  . mirtazapine (REMERON) 15 MG tablet Take 1 tablet (15 mg total) by mouth at bedtime.    . Multiple Vitamin (MULTIVITAMIN WITH MINERALS) TABS tablet Take 1 tablet by mouth daily.    . nitroGLYCERIN (NITROSTAT) 0.4 MG SL tablet Place 1 tablet (0.4 mg total) under the tongue every 5 (five) minutes as needed for chest pain. 25 tablet 6  . tiotropium (SPIRIVA) 18 MCG inhalation capsule Place 18 mcg into inhaler and inhale at bedtime.    . traMADol (ULTRAM) 50 MG tablet Take 50 mg by mouth every 6 (six) hours as needed for moderate pain.    . metolazone (ZAROXOLYN) 5 MG tablet Take 1 tablet (5 mg total) by mouth daily as needed. 30 tablet 3  . [DISCONTINUED] isosorbide mononitrate (IMDUR) 30 MG 24 hr tablet Take 1 tablet (30 mg total) by mouth daily. 30 tablet 6   No current facility-administered medications for this visit.     Allergies:   Review of patient's allergies indicates no known allergies.   Social History:  The patient  reports that he quit smoking about 9 years ago. His smoking use included Cigarettes. He has a 40 pack-year smoking history. He has never used smokeless tobacco. He reports  that he does not drink alcohol or use illicit drugs.   Family History:   family history includes Heart disease in his mother.    Review of Systems: Review of Systems  Constitutional: Negative.   Respiratory: Negative.   Cardiovascular: Positive for leg swelling.  Gastrointestinal: Negative.   Musculoskeletal: Negative.   Neurological: Negative.   Psychiatric/Behavioral: Negative.   All other systems reviewed and are negative.    PHYSICAL EXAM: VS:  BP 114/60 mmHg  Pulse 51  Ht 5\' 9"  (1.753 m)  Wt 231 lb (104.781 kg)  BMI 34.10 kg/m2 , BMI Body mass index is 34.1 kg/(m^2). GEN: Well nourished, well developed, in no acute distress HEENT: normal Neck: no JVD, carotid bruits, or masses Cardiac: RRR; bradycardia, no murmurs, rubs, or gallops, 1+ edema worse on the right than the left to below the knees Respiratory:  clear to auscultation bilaterally, normal work of breathing GI: soft, nontender, nondistended, + BS MS:  no deformity or atrophy Skin: warm and dry, no rash Neuro:  Strength and sensation are intact Psych: euthymic mood, full affect    Recent Labs: 09/14/2015: Magnesium 2.0 12/03/2015: ALT 31; BUN 16; Creatinine, Ser 1.03; Hemoglobin 13.1; Platelets 120*; Potassium 3.8; Sodium 139    Lipid Panel Lab Results  Component Value Date   CHOL 142 08/23/2015   HDL 33* 08/23/2015   LDLCALC 76 08/23/2015   TRIG 167* 08/23/2015      Wt Readings from Last 3 Encounters:  01/11/16 231 lb (104.781 kg)  01/01/16 232 lb 6.4 oz (105.416 kg)  12/19/15 234 lb (106.142 kg)       ASSESSMENT AND PLAN:  Paroxysmal atrial fibrillation (HCC) - Plan: EKG 12-Lead Maintaining normal sinus rhythm, no medication changes made  HYPERTENSION, BENIGN - Plan: EKG 12-Lead Blood pressure is well controlled on today's visit, actually on the low side Recommended he hold amlodipine given his leg swelling  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 No further workup at this time. Continue current medication regimen.  Chronic diastolic CHF (congestive heart failure) (HCC) Etiology of the leg swelling is unclear, possibly from amlodipine Recommended he take metolazone 2.5 up to 5 mg in the mornings prior to Lasix sparingly for leg swelling Cautioned him against overuse and dehydration  Shortness of breath Chronic mild stable shortness of breath, likely recent exacerbation from hot humid weather, not significant Leg edema out of proportion to his shortness of breath  Bilateral edema of lower extremity Dramatic pitting edema, recommended we monitor him closely Hold amlodipine, high-dose diuretic with metolazone  Disposition:   F/U  2-3 weeks   Orders Placed This Encounter  Procedures  . EKG 12-Lead    Total encounter time more than 15 minutes  Greater than 50% was spent in counseling and coordination of care with the patient   Signed, Esmond Plants, M.D., Ph.D. 01/11/2016  [place], [place]

## 2016-01-11 NOTE — Patient Instructions (Addendum)
Medication Instructions:   Please stop amlodipine, this can cause swelling  Take lasix 2 pill in the AM, 2 pills after lunch  Please take 1/2 metolazone  pill 30 minutes before lasix in the AM (TAKE sparingly, can cause dehydration)  If no good urination, increase up to a full metolazone   STOP METOLAZONE WHEN SWELLING BETTER, hopefully in 2 to 3 days   LEG ELEVATION for swelling  Follow-Up: It was a pleasure seeing you in the office today. Please call us if you have new issues that need to be addressed before your next appt.  [phone]  Your physician wants you to follow-up in: 2 to 3 weeks    If you need a refill on your cardiac medications before your next appointment, please call your pharmacy.

## 2016-01-15 DIAGNOSIS — G4733 Obstructive sleep apnea (adult) (pediatric): Secondary | ICD-10-CM | POA: Diagnosis not present

## 2016-01-18 ENCOUNTER — Telehealth: Payer: Self-pay | Admitting: Cardiovascular Disease

## 2016-01-18 NOTE — Telephone Encounter (Signed)
Pt called to let Dr. Rockey Situ know that his medication is working, and his swelling has "all gone down".

## 2016-01-25 ENCOUNTER — Other Ambulatory Visit: Payer: Self-pay | Admitting: Internal Medicine

## 2016-01-28 ENCOUNTER — Other Ambulatory Visit: Payer: Self-pay | Admitting: *Deleted

## 2016-01-28 ENCOUNTER — Telehealth: Payer: Self-pay

## 2016-01-28 MED ORDER — TRAMADOL HCL 50 MG PO TABS: 50.0000 mg | ORAL_TABLET | Freq: Four times a day (QID) | ORAL | Status: DC | PRN

## 2016-01-28 NOTE — Telephone Encounter (Signed)
Notified patient that his prescription for Ultram is ready for pick up at the front desk.

## 2016-02-10 DIAGNOSIS — G4733 Obstructive sleep apnea (adult) (pediatric): Secondary | ICD-10-CM | POA: Diagnosis not present

## 2016-02-12 NOTE — Progress Notes (Signed)
Cardiology Office Note Date:  02/14/2016  Patient ID:  Michael, Doyle 1945/08/14, MRN [id number] PCP:  [name], MD  Cardiologist:  Dr. Rockey Situ, MD    Chief Complaint: Follow up LE edema  History of Present Illness: [name] is a 70 y.o. male with history of CAD s/p 3-vessel CABG in 04/2009 with LIMA to LAD, SVG to OM1, and SVG to PDA, PAF in 09/2014 covnerting to sinus rhythm with metoprolol on Eliquis and amiodarone with documented known episdoes of Afib in 2013, 2015, and tachycardia concerning for Afib with RVR in 04/2015, atrial flutter diagnosed on 09/12/2015 on amiodarone only, chronic diastolic CHF/venous stasis, COPD 2/2 prior tobacco abuse, CLL, LE edema, PTSD, HTN, OSA on CPAP, and HLD who presents for routine follow up his Afib and LE edema. Cardiac cath from 09/2009 showed occluded VG x 2 with patent LIMA to LAD. There were collaterals from left-to-right. EF 55% wth mild anterolateral hypokinesis. Last echo from 11/02/2015 showed and EF of 55-60%, no RWMA, LV diastolic function was normal, left atrium was mildly dilated, RV systolic function was normal, PASP normal. Prior echo from date uncertain showed and EF of 35-45% with mildly RVSP. The only studies on file for review indicate an EF of 55% dating back to 02/2014.  At his last office visit with Dr. Rockey Situ on 01/11/2016 he noted increased LE edema. It was felt this may be 2/2 amlodipine and this was held. He was also advised to take metolazone as needed in the morning with Lasix 80 mg. The patient called back on 6/30 noting his LE edema had resolved.   He comes in today doing well. No further LE edema. He has not taken either Lasix or metolazone in the past 3 weeks. He does not want to take Lasix daily and will not make any changes at this time unless Dr. Rockey Situ makes them. He denies any early satiety stating, "I'll eat damn thing I want to." No orthopnea. No decrease in activity level. He does not have any  concerns today.    Past Medical History:  Diagnosis Date  . Arthritis   . Arthritis   . Atrial fibrillation ([place])    a. Dx 2013, recurred 02/2014, CHA2DS2VASc = 3 -->placed on Eliquis;  b. 02/2014 Echo: EF 50-55%, mid and apical anterior septum and mid and apical inf septum are abnl, mild to mod Ao sclerosis w/o AS.  Marland Kitchen Chicken pox   . Chronic lymphocytic leukemia ([place])    a. Dx 02/2014.  Marland Kitchen COPD (chronic obstructive pulmonary disease) ([place])   . Coronary artery disease    a. 04/2009 CABG x 3 (LIMA->LAD, VG->OM1, VG->PDA);  b. 09/2009 Cath: occluded VG x 2 w/ patent LIMA and L->R collats. EF 55%, mild antlat HK;  c. 10/2011 MV: EF 53%, no isch/infarct-->low risk.  Marland Kitchen History of arthritis   . Hypertension   . OSA (obstructive sleep apnea)    a. On CPAP  . Pure hypercholesterolemia   . Rheumatic fever     Past Surgical History:  Procedure Laterality Date  . APPENDECTOMY  1988  . CARDIAC CATHETERIZATION  2011   Dr Fletcher Anon  . CHOLECYSTECTOMY    . CORONARY ARTERY BYPASS GRAFT  2010   3v  . HERNIA REPAIR     abdominal  . HERNIA REPAIR    . TONSILLECTOMY AND ADENOIDECTOMY  1956    Current Outpatient Prescriptions  Medication Sig Dispense Refill  . amiodarone (PACERONE) 200 MG tablet Take  0.5 tablets (100 mg total) by mouth 2 (two) times daily. 30 tablet 6  . apixaban (ELIQUIS) 5 MG TABS tablet Take 1 tablet (5 mg total) by mouth 2 (two) times daily. 60 tablet 6  . atorvastatin (LIPITOR) 40 MG tablet Take 40 mg by mouth at bedtime.    . budesonide-formoterol (SYMBICORT) 160-4.5 MCG/ACT inhaler Inhale 2 puffs into the lungs 2 (two) times daily.    . Coenzyme Q10 (CO Q-10) 100 MG CAPS Take 100 mg by mouth at bedtime.     . furosemide (LASIX) 20 MG tablet Take 80 mg by mouth as needed for edema.     . metolazone (ZAROXOLYN) 5 MG tablet Take 1 tablet (5 mg total) by mouth daily as needed. 30 tablet 3  . metoprolol tartrate (LOPRESSOR) 25 MG tablet Take 25 mg by mouth daily.    . mirtazapine  (REMERON) 15 MG tablet Take 1 tablet (15 mg total) by mouth at bedtime.    . Multiple Vitamin (MULTIVITAMIN WITH MINERALS) TABS tablet Take 1 tablet by mouth daily.    . nitroGLYCERIN (NITROSTAT) 0.4 MG SL tablet Place 1 tablet (0.4 mg total) under the tongue every 5 (five) minutes as needed for chest pain. 25 tablet 6  . prazosin (MINIPRESS) 2 MG capsule Take 2 mg by mouth at bedtime.    Marland Kitchen tiotropium (SPIRIVA) 18 MCG inhalation capsule Place 18 mcg into inhaler and inhale at bedtime.    . traMADol (ULTRAM) 50 MG tablet Take 1 tablet (50 mg total) by mouth every 6 (six) hours as needed for moderate pain. 90 tablet 0   No current facility-administered medications for this visit.     Allergies:   Review of patient's allergies indicates no known allergies.   Social History:  The patient  reports that he quit smoking about 9 years ago. His smoking use included Cigarettes. He has a 40.00 pack-year smoking history. He has never used smokeless tobacco. He reports that he does not drink alcohol or use drugs.   Family History:  The patient's family history includes Heart disease in his mother.  ROS:   Review of Systems  Constitutional: Negative for chills, diaphoresis, fever, malaise/fatigue and weight loss.  HENT: Negative for congestion.   Eyes: Negative for discharge and redness.  Respiratory: Negative for cough, sputum production, shortness of breath and wheezing.   Cardiovascular: Negative for chest pain, palpitations, orthopnea, claudication, leg swelling and PND.  Gastrointestinal: Negative for abdominal pain, heartburn, nausea and vomiting.  Musculoskeletal: Negative for falls and myalgias.  Skin: Negative for rash.  Neurological: Negative for dizziness, tingling, tremors, sensory change, speech change, focal weakness, loss of consciousness and weakness.  Endo/Heme/Allergies: Does not bruise/bleed easily.  Psychiatric/Behavioral: Negative for substance abuse. The patient is not  nervous/anxious.   All other systems reviewed and are negative.    PHYSICAL EXAM:  VS:  BP (!) 150/70 (BP Location: Left Arm, Patient Position: Sitting, Cuff Size: Normal)   Pulse (!) 51   Ht 5\' 9"  (1.753 m)   Wt 233 lb 8 oz (105.9 kg)   BMI 34.48 kg/m  BMI: Body mass index is 34.48 kg/m.  Physical Exam  Constitutional: He is oriented to person, place, and time. He appears well-developed and well-nourished.  HENT:  Head: Normocephalic and atraumatic.  Eyes: Right eye exhibits no discharge. Left eye exhibits no discharge.  Neck: Normal range of motion. No JVD present.  Cardiovascular: Normal rate, regular rhythm, S1 normal, S2 normal and normal heart sounds.  Exam reveals no distant heart sounds, no friction rub, no midsystolic click and no opening snap.   No murmur heard. Pulmonary/Chest: Effort normal and breath sounds normal. No respiratory distress. He has no decreased breath sounds. He has no wheezes. He has no rales. He exhibits no tenderness.  Abdominal: Soft. He exhibits no distension. There is no tenderness.  Musculoskeletal: He exhibits no edema.  Neurological: He is alert and oriented to person, place, and time.  Skin: Skin is warm and dry. No cyanosis. Nails show no clubbing.  Psychiatric: He has a normal mood and affect. His speech is normal and behavior is normal. Judgment and thought content normal.     EKG:  Was ordered and interpreted by me today. Shows sinus bradycardia, 51 bpm, 1st degree AV block, nonspecific st/t changes along inferolateral leads  Recent Labs: 09/14/2015: Magnesium 2.0 12/03/2015: ALT 31; BUN 16; Creatinine, Ser 1.03; Hemoglobin 13.1; Platelets 120; Potassium 3.8; Sodium 139  08/23/2015: Cholesterol 142; HDL 33; LDL Cholesterol 76; Total CHOL/HDL Ratio 4.3; Triglycerides 167; VLDL 33   CrCl cannot be calculated (Patient's most recent lab result is older than the maximum 21 days allowed.).   Wt Readings from Last 3 Encounters:  02/14/16 233 lb 8  oz (105.9 kg)  01/11/16 231 lb (104.8 kg)  01/01/16 232 lb 6.4 oz (105.4 kg)     Other studies reviewed: Additional studies/records reviewed today include: summarized above  ASSESSMENT AND PLAN:  1. CAD s/p CABG as above: No symptoms concerning for angina. Continue current medications. No plans for ischemia evaluation at this time.   2. PAF/atrial flutter: CHADS2VASc of at least 4 (CHF, HTN, age x 1, vascular disease). He remains in sinus rhythm with a heart rate in the 50's bpm today. He reports no symptoms with his bradycardic heart rate and refuses any changes to his metoprolol. Continue Eliquis, metoprolol, and amiodarone.   3. Chronic diastolic CHF: He does not appear to be volume overloaded at this time. He refuses to tak any amount of Lasix daily and will only take this medication prn. He has not needed any Lasix or metolazone for the past 3 weeks. Continue beta blocker as above. No changes at this time as he refuses.   4. LE edema: Resolved. As above. Avoid CCB.   5. HTN: Well controlled at home with readings in the 1-teen to 123456 systolic. No changes.   6. COPD: Per primary. He does not appear to be having an active exacerbation. Stable.   7. CLL: Per PCP  Disposition: F/u with Dr. Rockey Situ, MD in 6 months.   Current medicines are reviewed at length with the patient today.  The patient did not have any concerns regarding medicines.  Melvern Banker PA-C 02/14/2016 2:43 PM     [name] [place], [place] [address] [phone]

## 2016-02-14 ENCOUNTER — Ambulatory Visit (INDEPENDENT_AMBULATORY_CARE_PROVIDER_SITE_OTHER): Payer: Medicare HMO | Admitting: Physician Assistant

## 2016-02-14 ENCOUNTER — Encounter: Payer: Self-pay | Admitting: Physician Assistant

## 2016-02-14 VITALS — BP 150/70 | HR 51 | Ht 69.0 in | Wt 233.5 lb

## 2016-02-14 DIAGNOSIS — I4891 Unspecified atrial fibrillation: Secondary | ICD-10-CM

## 2016-02-14 DIAGNOSIS — I5032 Chronic diastolic (congestive) heart failure: Secondary | ICD-10-CM

## 2016-02-14 DIAGNOSIS — I2510 Atherosclerotic heart disease of native coronary artery without angina pectoris: Secondary | ICD-10-CM | POA: Diagnosis not present

## 2016-02-14 DIAGNOSIS — R600 Localized edema: Secondary | ICD-10-CM

## 2016-02-14 DIAGNOSIS — I483 Typical atrial flutter: Secondary | ICD-10-CM

## 2016-02-14 DIAGNOSIS — I480 Paroxysmal atrial fibrillation: Secondary | ICD-10-CM

## 2016-02-14 DIAGNOSIS — C9110 Chronic lymphocytic leukemia of B-cell type not having achieved remission: Secondary | ICD-10-CM

## 2016-02-14 NOTE — Patient Instructions (Addendum)
Medication Instructions:  Your physician recommends that you continue on your current medications as directed. Please refer to the Current Medication list given to you today.   Labwork: none  Testing/Procedures: none  Follow-Up: Your physician wants you to follow-up in: six months with Dr. Gollan. You will receive a reminder letter in the mail two months in advance. If you don't receive a letter, please call our office to schedule the follow-up appointment.   Any Other Special Instructions Will Be Listed Below (If Applicable).     If you need a refill on your cardiac medications before your next appointment, please call your pharmacy.   

## 2016-02-19 DIAGNOSIS — M1712 Unilateral primary osteoarthritis, left knee: Secondary | ICD-10-CM | POA: Diagnosis not present

## 2016-02-19 DIAGNOSIS — M1711 Unilateral primary osteoarthritis, right knee: Secondary | ICD-10-CM | POA: Diagnosis not present

## 2016-02-26 ENCOUNTER — Emergency Department: Payer: Non-veteran care

## 2016-02-26 ENCOUNTER — Telehealth: Payer: Self-pay | Admitting: Cardiovascular Disease

## 2016-02-26 ENCOUNTER — Encounter: Payer: Self-pay | Admitting: Emergency Medicine

## 2016-02-26 ENCOUNTER — Observation Stay
Admission: EM | Admit: 2016-02-26 | Discharge: 2016-02-29 | Payer: Non-veteran care | Attending: Internal Medicine | Admitting: Internal Medicine

## 2016-02-26 DIAGNOSIS — Z8249 Family history of ischemic heart disease and other diseases of the circulatory system: Secondary | ICD-10-CM | POA: Insufficient documentation

## 2016-02-26 DIAGNOSIS — I110 Hypertensive heart disease with heart failure: Secondary | ICD-10-CM | POA: Diagnosis not present

## 2016-02-26 DIAGNOSIS — R0789 Other chest pain: Secondary | ICD-10-CM

## 2016-02-26 DIAGNOSIS — G4733 Obstructive sleep apnea (adult) (pediatric): Secondary | ICD-10-CM | POA: Insufficient documentation

## 2016-02-26 DIAGNOSIS — T446X5A Adverse effect of alpha-adrenoreceptor antagonists, initial encounter: Secondary | ICD-10-CM | POA: Diagnosis not present

## 2016-02-26 DIAGNOSIS — R079 Chest pain, unspecified: Secondary | ICD-10-CM | POA: Diagnosis not present

## 2016-02-26 DIAGNOSIS — I491 Atrial premature depolarization: Secondary | ICD-10-CM | POA: Diagnosis not present

## 2016-02-26 DIAGNOSIS — R69 Illness, unspecified: Secondary | ICD-10-CM | POA: Diagnosis not present

## 2016-02-26 DIAGNOSIS — Z8673 Personal history of transient ischemic attack (TIA), and cerebral infarction without residual deficits: Secondary | ICD-10-CM | POA: Insufficient documentation

## 2016-02-26 DIAGNOSIS — I10 Essential (primary) hypertension: Secondary | ICD-10-CM

## 2016-02-26 DIAGNOSIS — E7800 Pure hypercholesterolemia, unspecified: Secondary | ICD-10-CM | POA: Insufficient documentation

## 2016-02-26 DIAGNOSIS — F4310 Post-traumatic stress disorder, unspecified: Secondary | ICD-10-CM | POA: Insufficient documentation

## 2016-02-26 DIAGNOSIS — D72829 Elevated white blood cell count, unspecified: Secondary | ICD-10-CM | POA: Diagnosis not present

## 2016-02-26 DIAGNOSIS — I480 Paroxysmal atrial fibrillation: Secondary | ICD-10-CM | POA: Diagnosis not present

## 2016-02-26 DIAGNOSIS — I739 Peripheral vascular disease, unspecified: Secondary | ICD-10-CM | POA: Diagnosis not present

## 2016-02-26 DIAGNOSIS — M170 Bilateral primary osteoarthritis of knee: Secondary | ICD-10-CM | POA: Diagnosis not present

## 2016-02-26 DIAGNOSIS — I495 Sick sinus syndrome: Secondary | ICD-10-CM | POA: Insufficient documentation

## 2016-02-26 DIAGNOSIS — I4892 Unspecified atrial flutter: Secondary | ICD-10-CM | POA: Insufficient documentation

## 2016-02-26 DIAGNOSIS — C9110 Chronic lymphocytic leukemia of B-cell type not having achieved remission: Secondary | ICD-10-CM | POA: Diagnosis not present

## 2016-02-26 DIAGNOSIS — Z8679 Personal history of other diseases of the circulatory system: Secondary | ICD-10-CM | POA: Insufficient documentation

## 2016-02-26 DIAGNOSIS — R001 Bradycardia, unspecified: Principal | ICD-10-CM | POA: Insufficient documentation

## 2016-02-26 DIAGNOSIS — I440 Atrioventricular block, first degree: Secondary | ICD-10-CM | POA: Diagnosis not present

## 2016-02-26 DIAGNOSIS — I2510 Atherosclerotic heart disease of native coronary artery without angina pectoris: Secondary | ICD-10-CM | POA: Diagnosis not present

## 2016-02-26 DIAGNOSIS — Z7901 Long term (current) use of anticoagulants: Secondary | ICD-10-CM | POA: Insufficient documentation

## 2016-02-26 DIAGNOSIS — I25708 Atherosclerosis of coronary artery bypass graft(s), unspecified, with other forms of angina pectoris: Secondary | ICD-10-CM | POA: Diagnosis not present

## 2016-02-26 DIAGNOSIS — Z9049 Acquired absence of other specified parts of digestive tract: Secondary | ICD-10-CM | POA: Insufficient documentation

## 2016-02-26 DIAGNOSIS — E876 Hypokalemia: Secondary | ICD-10-CM | POA: Diagnosis not present

## 2016-02-26 DIAGNOSIS — R531 Weakness: Secondary | ICD-10-CM

## 2016-02-26 DIAGNOSIS — Z79899 Other long term (current) drug therapy: Secondary | ICD-10-CM | POA: Insufficient documentation

## 2016-02-26 DIAGNOSIS — I5032 Chronic diastolic (congestive) heart failure: Secondary | ICD-10-CM | POA: Diagnosis not present

## 2016-02-26 DIAGNOSIS — D696 Thrombocytopenia, unspecified: Secondary | ICD-10-CM

## 2016-02-26 DIAGNOSIS — R161 Splenomegaly, not elsewhere classified: Secondary | ICD-10-CM | POA: Insufficient documentation

## 2016-02-26 DIAGNOSIS — Z951 Presence of aortocoronary bypass graft: Secondary | ICD-10-CM | POA: Insufficient documentation

## 2016-02-26 DIAGNOSIS — I455 Other specified heart block: Secondary | ICD-10-CM

## 2016-02-26 DIAGNOSIS — R400 Somnolence: Secondary | ICD-10-CM | POA: Insufficient documentation

## 2016-02-26 DIAGNOSIS — J449 Chronic obstructive pulmonary disease, unspecified: Secondary | ICD-10-CM | POA: Insufficient documentation

## 2016-02-26 DIAGNOSIS — Z87891 Personal history of nicotine dependence: Secondary | ICD-10-CM | POA: Insufficient documentation

## 2016-02-26 DIAGNOSIS — I9589 Other hypotension: Secondary | ICD-10-CM | POA: Insufficient documentation

## 2016-02-26 DIAGNOSIS — I25810 Atherosclerosis of coronary artery bypass graft(s) without angina pectoris: Secondary | ICD-10-CM | POA: Diagnosis not present

## 2016-02-26 LAB — COMPREHENSIVE METABOLIC PANEL
ALK PHOS: 79 U/L (ref 38–126)
ALT: 37 U/L (ref 17–63)
AST: 23 U/L (ref 15–41)
Albumin: 4.3 g/dL (ref 3.5–5.0)
Anion gap: 4 — ABNORMAL LOW (ref 5–15)
BILIRUBIN TOTAL: 1.6 mg/dL — AB (ref 0.3–1.2)
BUN: 14 mg/dL (ref 6–20)
CALCIUM: 8.8 mg/dL — AB (ref 8.9–10.3)
CO2: 27 mmol/L (ref 22–32)
CREATININE: 0.91 mg/dL (ref 0.61–1.24)
Chloride: 108 mmol/L (ref 101–111)
Glucose, Bld: 131 mg/dL — ABNORMAL HIGH (ref 65–99)
Potassium: 3.7 mmol/L (ref 3.5–5.1)
Sodium: 139 mmol/L (ref 135–145)
Total Protein: 7 g/dL (ref 6.5–8.1)

## 2016-02-26 LAB — CBC
HEMATOCRIT: 37.8 % — AB (ref 40.0–52.0)
Hemoglobin: 13.1 g/dL (ref 13.0–18.0)
MCH: 32.7 pg (ref 26.0–34.0)
MCHC: 34.8 g/dL (ref 32.0–36.0)
MCV: 94 fL (ref 80.0–100.0)
Platelets: 115 10*3/uL — ABNORMAL LOW (ref 150–440)
RBC: 4.02 MIL/uL — AB (ref 4.40–5.90)
RDW: 14.8 % — ABNORMAL HIGH (ref 11.5–14.5)
WBC: 6.5 10*3/uL (ref 3.8–10.6)

## 2016-02-26 LAB — TROPONIN I

## 2016-02-26 LAB — TSH: TSH: 1.256 u[IU]/mL (ref 0.350–4.500)

## 2016-02-26 LAB — T4, FREE: Free T4: 0.91 ng/dL (ref 0.61–1.12)

## 2016-02-26 LAB — MAGNESIUM: MAGNESIUM: 2 mg/dL (ref 1.7–2.4)

## 2016-02-26 LAB — BRAIN NATRIURETIC PEPTIDE: B NATRIURETIC PEPTIDE 5: 248 pg/mL — AB (ref 0.0–100.0)

## 2016-02-26 IMAGING — DX DG CHEST 1V PORT
1 series · 1 of 1 positions shown · non-contrast
Comparison: [DATE]

CLINICAL DATA: 69-year-old male with a history of left-sided chest
pressure

EXAM:
PORTABLE CHEST 1 VIEW

[chest ap]
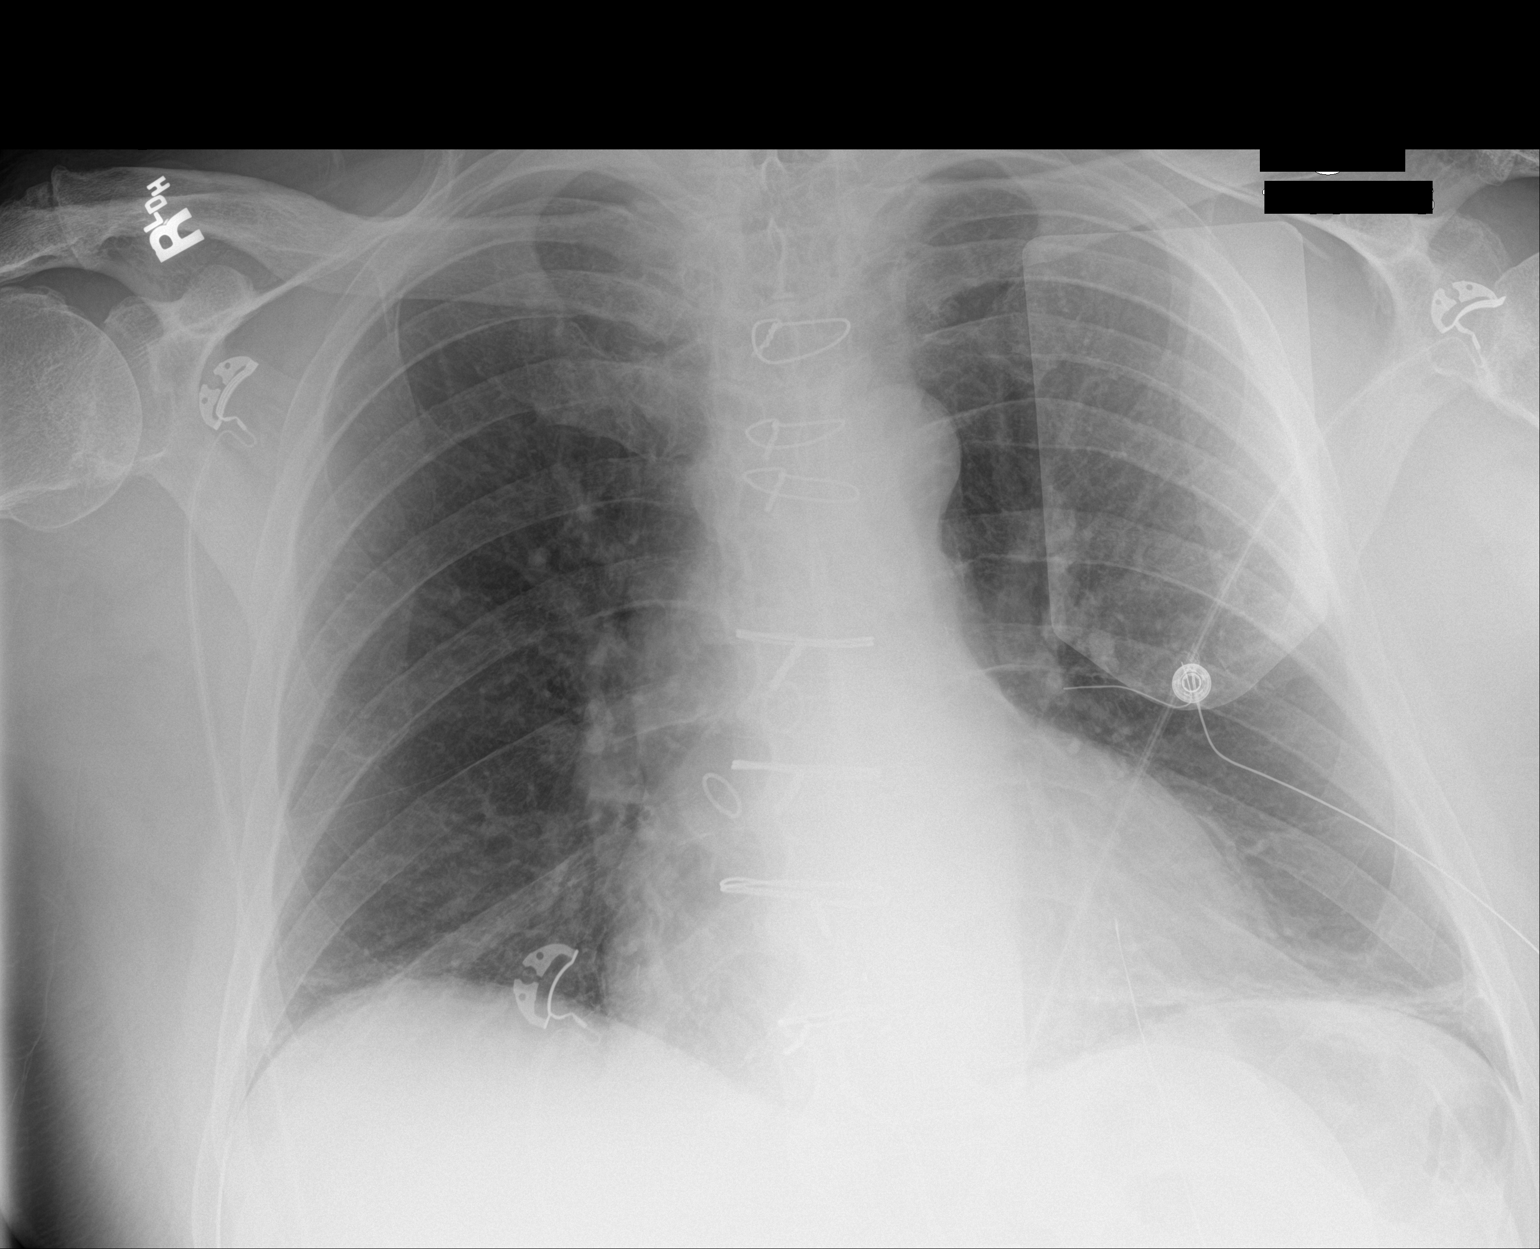

[1 of 1 positions shown; findings below may reference images not displayed]

FINDINGS: Cardiomediastinal silhouette unchanged.

Surgical changes of prior median sternotomy and CABG.

Defibrillator pads project over the chest.

Linear opacity at the left base is unchanged from comparison.

No pneumothorax. No confluent airspace disease. No large pleural
effusion.

No displaced fractures identified.
IMPRESSION: Chronic lung changes with basilar atelectasis/ scarring, and no
radiographic evidence of superimposed acute cardiopulmonary disease.

Surgical changes of median sternotomy and CABG.

## 2016-02-26 MED ORDER — SODIUM CHLORIDE 0.9 % IV SOLN: 250.0000 mL | INTRAVENOUS | Status: DC | PRN

## 2016-02-26 MED ORDER — APIXABAN 5 MG PO TABS
5.0000 mg | ORAL_TABLET | Freq: Two times a day (BID) | ORAL | Status: DC
  Administered 2016-02-26 – 2016-02-29 (×6): 5 mg via ORAL
  Filled 2016-02-26 (×6): qty 1

## 2016-02-26 MED ORDER — FLUTICASONE FUROATE-VILANTEROL 200-25 MCG/INH IN AEPB
1.0000 | INHALATION_SPRAY | Freq: Every day | RESPIRATORY_TRACT | Status: DC
  Administered 2016-02-27 – 2016-02-29 (×3): 1 via RESPIRATORY_TRACT
  Filled 2016-02-26 (×2): qty 28

## 2016-02-26 MED ORDER — ACETAMINOPHEN 325 MG PO TABS
650.0000 mg | ORAL_TABLET | Freq: Four times a day (QID) | ORAL | Status: DC | PRN
  Administered 2016-02-27 – 2016-02-29 (×3): 650 mg via ORAL
  Filled 2016-02-26 (×3): qty 2

## 2016-02-26 MED ORDER — FENTANYL CITRATE (PF) 100 MCG/2ML IJ SOLN
50.0000 ug | INTRAMUSCULAR | Status: DC | PRN
  Administered 2016-02-26: 50 ug via INTRAVENOUS
  Filled 2016-02-26: qty 2

## 2016-02-26 MED ORDER — METOLAZONE 5 MG PO TABS: 5.0000 mg | ORAL_TABLET | Freq: Every day | ORAL | Status: DC | PRN

## 2016-02-26 MED ORDER — NITROGLYCERIN 0.4 MG SL SUBL: 0.4000 mg | SUBLINGUAL_TABLET | SUBLINGUAL | Status: DC | PRN

## 2016-02-26 MED ORDER — CO Q-10 100 MG PO CAPS: 100.0000 mg | ORAL_CAPSULE | Freq: Every day | ORAL | Status: DC

## 2016-02-26 MED ORDER — TRAMADOL HCL 50 MG PO TABS
50.0000 mg | ORAL_TABLET | Freq: Four times a day (QID) | ORAL | Status: DC | PRN
  Administered 2016-02-26 – 2016-02-27 (×2): 50 mg via ORAL
  Filled 2016-02-26 (×2): qty 1

## 2016-02-26 MED ORDER — ADULT MULTIVITAMIN W/MINERALS CH
1.0000 | ORAL_TABLET | Freq: Every day | ORAL | Status: DC
  Administered 2016-02-27 – 2016-02-29 (×3): 1 via ORAL
  Filled 2016-02-26 (×3): qty 1

## 2016-02-26 MED ORDER — ONDANSETRON HCL 4 MG PO TABS: 4.0000 mg | ORAL_TABLET | Freq: Four times a day (QID) | ORAL | Status: DC | PRN

## 2016-02-26 MED ORDER — TIOTROPIUM BROMIDE MONOHYDRATE 18 MCG IN CAPS
18.0000 ug | ORAL_CAPSULE | Freq: Every day | RESPIRATORY_TRACT | Status: DC
Start: 2016-02-26 — End: 2016-02-29
  Administered 2016-02-26 – 2016-02-28 (×3): 18 ug via RESPIRATORY_TRACT
  Filled 2016-02-26: qty 5

## 2016-02-26 MED ORDER — ISOSORBIDE MONONITRATE ER 30 MG PO TB24
30.0000 mg | ORAL_TABLET | Freq: Every day | ORAL | Status: DC
  Administered 2016-02-26 – 2016-02-29 (×2): 30 mg via ORAL
  Filled 2016-02-26 (×3): qty 1

## 2016-02-26 MED ORDER — SODIUM CHLORIDE 0.9% FLUSH
3.0000 mL | Freq: Two times a day (BID) | INTRAVENOUS | Status: DC
  Administered 2016-02-26 – 2016-02-29 (×5): 3 mL via INTRAVENOUS

## 2016-02-26 MED ORDER — ALBUTEROL SULFATE (2.5 MG/3ML) 0.083% IN NEBU
10.0000 mg | INHALATION_SOLUTION | Freq: Once | RESPIRATORY_TRACT | Status: AC
  Administered 2016-02-26: 2.5 mg via RESPIRATORY_TRACT
  Filled 2016-02-26: qty 12

## 2016-02-26 MED ORDER — AMLODIPINE BESYLATE 5 MG PO TABS
5.0000 mg | ORAL_TABLET | Freq: Every day | ORAL | Status: DC
  Filled 2016-02-26: qty 1

## 2016-02-26 MED ORDER — PRAZOSIN HCL 1 MG PO CAPS
2.0000 mg | ORAL_CAPSULE | Freq: Every day | ORAL | Status: DC
  Administered 2016-02-26 – 2016-02-28 (×3): 2 mg via ORAL
  Filled 2016-02-26 (×3): qty 2
  Filled 2016-02-26: qty 1
  Filled 2016-02-26: qty 2

## 2016-02-26 MED ORDER — ATORVASTATIN CALCIUM 20 MG PO TABS
40.0000 mg | ORAL_TABLET | Freq: Every day | ORAL | Status: DC
  Administered 2016-02-26 – 2016-02-28 (×3): 40 mg via ORAL
  Filled 2016-02-26 (×3): qty 2

## 2016-02-26 MED ORDER — MIRTAZAPINE 15 MG PO TABS
15.0000 mg | ORAL_TABLET | Freq: Every day | ORAL | Status: DC
  Administered 2016-02-26: 15 mg via ORAL
  Filled 2016-02-26: qty 1

## 2016-02-26 MED ORDER — ONDANSETRON HCL 4 MG/2ML IJ SOLN: 4.0000 mg | Freq: Four times a day (QID) | INTRAMUSCULAR | Status: DC | PRN

## 2016-02-26 MED ORDER — ACETAMINOPHEN 650 MG RE SUPP: 650.0000 mg | Freq: Four times a day (QID) | RECTAL | Status: DC | PRN

## 2016-02-26 MED ORDER — FUROSEMIDE 20 MG PO TABS: 20.0000 mg | ORAL_TABLET | Freq: Two times a day (BID) | ORAL | Status: DC | PRN

## 2016-02-26 MED ORDER — SODIUM CHLORIDE 0.9% FLUSH: 3.0000 mL | INTRAVENOUS | Status: DC | PRN

## 2016-02-26 NOTE — H&P (Signed)
[facility] NAME: [name]    MR#:  [id number]  DATE OF BIRTH:  21-Dec-1945  DATE OF ADMISSION:  02/26/2016  PRIMARY CARE PHYSICIAN: [name], MD   REQUESTING/REFERRING PHYSICIAN: Dr. Joseph Berkshire  CHIEF COMPLAINT:   Chief Complaint  Patient presents with  . Chest Pain    HISTORY OF PRESENT ILLNESS: [name]  is a 70 y.o. male with a known history of Atrial fibrillation, coronary artery disease, chronic lymphocytic leukemia, COPD, hypertension and sleep apnea presenting with chest pressure and and weakness.  Patient reports that he's been feeling weak for about 1 week in duration as well as dizziness upon standing. He started having nonexertional chest pain and pressure to out of 10 in severity starting this morning around 8 AM while watching TV. Patient was given some IV fentanyl with resolution of all his symptoms. He is noted to have heart rate that is in the 40s to 50s range in the ER. Patient is on amiodarone as needed according to him. He was told to take that if his heart rate goes high. He does take metoprolol. PAST MEDICAL HISTORY:   Past Medical History:  Diagnosis Date  . Arthritis   . Arthritis   . Atrial fibrillation ([place])    a. Dx 2013, recurred 02/2014, CHA2DS2VASc = 3 -->placed on Eliquis;  b. 02/2014 Echo: EF 50-55%, mid and apical anterior septum and mid and apical inf septum are abnl, mild to mod Ao sclerosis w/o AS.  Marland Kitchen Chicken pox   . Chronic lymphocytic leukemia ([place])    a. Dx 02/2014.  Marland Kitchen COPD (chronic obstructive pulmonary disease) ([place])   . Coronary artery disease    a. 04/2009 CABG x 3 (LIMA->LAD, VG->OM1, VG->PDA);  b. 09/2009 Cath: occluded VG x 2 w/ patent LIMA and L->R collats. EF 55%, mild antlat HK;  c. 10/2011 MV: EF 53%, no isch/infarct-->low risk.  Marland Kitchen History of arthritis   . Hypertension   . OSA (obstructive sleep apnea)    a. On CPAP  . Pure hypercholesterolemia   . Rheumatic  fever     PAST SURGICAL HISTORY: Past Surgical History:  Procedure Laterality Date  . APPENDECTOMY  1988  . CARDIAC CATHETERIZATION  2011   Dr Fletcher Anon  . CHOLECYSTECTOMY    . CORONARY ARTERY BYPASS GRAFT  2010   3v  . HERNIA REPAIR     abdominal  . HERNIA REPAIR    . TONSILLECTOMY AND ADENOIDECTOMY  1956    SOCIAL HISTORY:  Social History  Substance Use Topics  . Smoking status: Former Smoker    Packs/day: 1.00    Years: 40.00    Types: Cigarettes    Quit date: 07/21/2006  . Smokeless tobacco: Never Used  . Alcohol use No     Comment: occasionally    FAMILY HISTORY:  Family History  Problem Relation Age of Onset  . Heart disease Mother   . Coronary artery disease      family history    DRUG ALLERGIES: No Known Allergies  REVIEW OF SYSTEMS:   CONSTITUTIONAL: No fever, positive fatigue and weakness.  EYES: No blurred or double vision.  EARS, NOSE, AND THROAT: No tinnitus or ear pain.  RESPIRATORY: No cough, chronic shortness of breath, no or hemoptysis.  CARDIOVASCULAR: Positive chest pain, no orthopnea, edema.  GASTROINTESTINAL: No nausea, vomiting, diarrhea or abdominal pain.  GENITOURINARY: No dysuria, hematuria.  ENDOCRINE: No polyuria, nocturia,  HEMATOLOGY: No anemia,  easy bruising or bleeding SKIN: No rash or lesion. MUSCULOSKELETAL: No joint pain or arthritis.   NEUROLOGIC: No tingling, numbness, weakness.  PSYCHIATRY: No anxiety or depression. Positive for PTSD  MEDICATIONS AT HOME:  Prior to Admission medications   Medication Sig Start Date End Date Taking? Authorizing Provider  amiodarone (PACERONE) 200 MG tablet Take 0.5 tablets (100 mg total) by mouth 2 (two) times daily. Patient taking differently: Take 100 mg by mouth 2 (two) times daily as needed.  10/24/15  Yes [name], MD  amLODipine (NORVASC) 5 MG tablet Take 5 mg by mouth daily.   Yes Historical Provider, MD  apixaban (ELIQUIS) 5 MG TABS tablet Take 1 tablet (5 mg total) by mouth 2  (two) times daily. 02/12/15  Yes [name], MD  atorvastatin (LIPITOR) 40 MG tablet Take 40 mg by mouth at bedtime.   Yes Historical Provider, MD  budesonide-formoterol (SYMBICORT) 160-4.5 MCG/ACT inhaler Inhale 2 puffs into the lungs 2 (two) times daily.   Yes Historical Provider, MD  Coenzyme Q10 (CO Q-10) 100 MG CAPS Take 100 mg by mouth at bedtime.    Yes Historical Provider, MD  furosemide (LASIX) 20 MG tablet Take 20 mg by mouth 2 (two) times daily as needed for edema.    Yes Historical Provider, MD  metolazone (ZAROXOLYN) 5 MG tablet Take 1 tablet (5 mg total) by mouth daily as needed. 01/11/16  Yes [name], MD  metoprolol tartrate (LOPRESSOR) 25 MG tablet Take 25 mg by mouth 2 (two) times daily.    Yes Historical Provider, MD  mirtazapine (REMERON) 15 MG tablet Take 1 tablet (15 mg total) by mouth at bedtime. 11/09/15  Yes [name], MD  Multiple Vitamin (MULTIVITAMIN WITH MINERALS) TABS tablet Take 1 tablet by mouth daily.   Yes Historical Provider, MD  nitroGLYCERIN (NITROSTAT) 0.4 MG SL tablet Place 1 tablet (0.4 mg total) under the tongue every 5 (five) minutes as needed for chest pain. 11/03/15  Yes [name], MD  prazosin (MINIPRESS) 2 MG capsule Take 2 mg by mouth at bedtime.   Yes Historical Provider, MD  tiotropium (SPIRIVA) 18 MCG inhalation capsule Place 18 mcg into inhaler and inhale at bedtime.   Yes Historical Provider, MD  traMADol (ULTRAM) 50 MG tablet Take 1 tablet (50 mg total) by mouth every 6 (six) hours as needed for moderate pain. 01/28/16  Yes Cammie Sickle, MD      PHYSICAL EXAMINATION:   VITAL SIGNS: Blood pressure (!) 155/92, pulse (!) 43, temperature 97.5 F (36.4 C), temperature source Oral, resp. rate 13, height 5\' 9"  (1.753 m), weight 104.3 kg (230 lb), SpO2 99 %.  GENERAL:  70 y.o.-year-old patient lying in the bed with no acute distress.  EYES: Pupils equal, round, reactive to light and accommodation. No scleral icterus.  Extraocular muscles intact.  HEENT: Head atraumatic, normocephalic. Oropharynx and nasopharynx clear.  NECK:  Supple, no jugular venous distention. No thyroid enlargement, no tenderness.  LUNGS: Normal breath sounds bilaterally, no wheezing, rales,rhonchi or crepitation. No use of accessory muscles of respiration.  CARDIOVASCULAR: S1, S2 normal. No murmurs, rubs, or gallops.  ABDOMEN: Soft, nontender, nondistended. Bowel sounds present. No organomegaly or mass.  EXTREMITIES: No pedal edema, cyanosis, or clubbing.  NEUROLOGIC: Cranial nerves II through XII are intact. Muscle strength 5/5 in all extremities. Sensation intact. Gait not checked.  PSYCHIATRIC: The patient is alert and oriented x 3.  SKIN: No obvious rash, lesion, or ulcer.  LABORATORY PANEL:   CBC  Recent Labs Lab 02/26/16 1058  WBC 6.5  HGB 13.1  HCT 37.8*  PLT 115*  MCV 94.0  MCH 32.7  MCHC 34.8  RDW 14.8*   ------------------------------------------------------------------------------------------------------------------  Chemistries   Recent Labs Lab 02/26/16 1058  NA 139  K 3.7  CL 108  CO2 27  GLUCOSE 131*  BUN 14  CREATININE 0.91  CALCIUM 8.8*  AST 23  ALT 37  ALKPHOS 79  BILITOT 1.6*   ------------------------------------------------------------------------------------------------------------------ estimated creatinine clearance is 89.9 mL/min (by C-G formula based on SCr of 0.91 mg/dL). ------------------------------------------------------------------------------------------------------------------ No results for input(s): TSH, T4TOTAL, T3FREE, THYROIDAB in the last 72 hours.  Invalid input(s): FREET3   Coagulation profile No results for input(s): INR, PROTIME in the last 168 hours. ------------------------------------------------------------------------------------------------------------------- No results for input(s): DDIMER in the last 72  hours. -------------------------------------------------------------------------------------------------------------------  Cardiac Enzymes  Recent Labs Lab 02/26/16 1058  TROPONINI <0.03   ------------------------------------------------------------------------------------------------------------------ Invalid input(s): POCBNP  ---------------------------------------------------------------------------------------------------------------  Urinalysis    Component Value Date/Time   COLORURINE YELLOW (A) 08/23/2015 1131   APPEARANCEUR CLEAR (A) 08/23/2015 1131   APPEARANCEUR Clear 10/11/2014 2003   LABSPEC 1.014 08/23/2015 1131   LABSPEC 1.017 10/11/2014 2003   PHURINE 6.0 08/23/2015 1131   GLUCOSEU NEGATIVE 08/23/2015 1131   GLUCOSEU Negative 10/11/2014 2003   HGBUR 1+ (A) 08/23/2015 1131   BILIRUBINUR NEGATIVE 08/23/2015 1131   BILIRUBINUR Negative 10/11/2014 2003   KETONESUR NEGATIVE 08/23/2015 1131   PROTEINUR NEGATIVE 08/23/2015 1131   UROBILINOGEN 1.0 05/12/2009 1426   NITRITE NEGATIVE 08/23/2015 1131   LEUKOCYTESUR NEGATIVE 08/23/2015 1131   LEUKOCYTESUR Negative 10/11/2014 2003     RADIOLOGY: Dg Chest Port 1 View  Result Date: 02/26/2016 CLINICAL DATA:  70 year old male with a history of left-sided chest pressure EXAM: PORTABLE CHEST 1 VIEW COMPARISON:  09/14/2015 FINDINGS: Cardiomediastinal silhouette unchanged. 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 Defibrillator pads project over the chest. Linear opacity at the left base is unchanged from comparison. No pneumothorax. No confluent airspace disease. No large pleural effusion. No displaced fractures identified. IMPRESSION: Chronic lung changes with basilar atelectasis/ scarring, and no radiographic evidence of superimposed acute cardiopulmonary disease. Surgical changes of median sternotomy and CABG. Signed, Dulcy Fanny. Earleen Newport, DO [facility]iology  Electronically Signed   By: Corrie Mckusick D.O.   On: 02/26/2016 12:01    EKG: Orders placed or performed during the hospital encounter of 02/26/16  . EKG 12-Lead  . EKG 12-Lead    IMPRESSION AND PLAN: Patient is a 70 year old white male with history of coronary artery disease presents with chest pressure and weakness  1. Chest pressure we will place patient under observation check serial cardiac enzymes Ask cardiology to see Patient already on Eliquis We'll use when necessary nitroglycerin  2. Weakness possibly related to bradycardia We'll hold his metoprolol [facility] physical therapy evaluate the patient  3. Bradycardia Patient on when necessary amiodarone which we will hold as well as hold his metoprolol until cardiology evaluation is done  4. Atrophic fibrillation Hold metoprolol continue liquids  5. Obstructive sleep apnea continue CPAP  6. Miscellaneous Ellik was for DVT prophylaxis  All the records are reviewed and case discussed with ED provider. Management plans discussed with the patient, family and they are in agreement.  CODE STATUS: Code Status History    Date Active Date Inactive Code Status Order ID Comments User Context   11/02/2015  2:34 PM 11/03/2015  5:34 PM Full Code [id number]  Gladstone Lighter, MD ED   08/22/2015  7:43 PM 08/23/2015  6:32 PM Full Code [id number]  Nicholes Mango, MD Inpatient       TOTAL TIME TAKING CARE OF THIS PATIENT: 55 minutes.    Dustin Flock M.D on 02/26/2016 at 12:29 PM  Between 7am to 6pm - Pager - [phone]  After 6pm go to [email or web address] - password [email or web address] [facility]  Office  [phone]  CC: Primary care physician; [name], MD

## 2016-02-26 NOTE — Consult Note (Signed)
Cardiology Consultation   Patient ID: NEO TARWATER; [id number]; 05/11/1946   Admit date: 02/26/2016 Date of Consult: 02/26/2016  Referring MD:  Dr. Posey Pronto  Patient Care Team: [name], MD as PCP - General (Internal Medicine) [name], MD as Consulting Physician (Cardiology)    Reason for Consultation: CP   History of Present Illness: [name] is a 70 y.o. male with a hx of CAD s/p 3-vessel CABG in 04/2009 with LIMA to LAD, SVG to OM1, and SVG to PDA, PAF in 09/2014 covnerting to sinus rhythm with metoprolol on Eliquis and amiodarone with documented known episdoes of Afib in 2013, 2015, and tachycardia concerning for Afib with RVR in 04/2015, atrial flutter diagnosed on 09/12/2015 on amiodarone only, chronic diastolic CHF/venous stasis, COPD 2/2 prior tobacco abuse, CLL, LE edema, PTSD, HTN, OSA on CPAP, and HLD   He presented to the ER today for episode of chest pain. He describes this as a dull ache in the center of the chest, moderate in severity, on and off for several hours today at least 3 hours. This was associated with elevated blood pressure [id number] systolic. His heart rate has been running on the low side for quite some time now. He recorded it as 50s this morning. He was also complaining not feeling well, dizzy and lightheaded. No episode of loss of consciousness or passing out. Because of the severity of his symptoms, he decided to go to the emergency room for further evaluation.  Currently, his chest pain has significantly improved with pain medications he's received. His blood pressure started to come down.    ROS:  Please see the history of present illness.  ROS All other ROS reviewed and negative.    Past Medical History:  Diagnosis Date  . Arthritis   . Arthritis   . Atrial fibrillation ([place])    a. Dx 2013, recurred 02/2014, CHA2DS2VASc = 3 -->placed on Eliquis;  b. 02/2014 Echo: EF 50-55%, mid and apical anterior septum and mid and apical inf septum  are abnl, mild to mod Ao sclerosis w/o AS.  Marland Kitchen Chicken pox   . Chronic lymphocytic leukemia ([place])    a. Dx 02/2014.  Marland Kitchen COPD (chronic obstructive pulmonary disease) ([place])   . Coronary artery disease    a. 04/2009 CABG x 3 (LIMA->LAD, VG->OM1, VG->PDA);  b. 09/2009 Cath: occluded VG x 2 w/ patent LIMA and L->R collats. EF 55%, mild antlat HK;  c. 10/2011 MV: EF 53%, no isch/infarct-->low risk.  Marland Kitchen History of arthritis   . Hypertension   . OSA (obstructive sleep apnea)    a. On CPAP  . Pure hypercholesterolemia   . Rheumatic fever     Past Surgical History:  Procedure Laterality Date  . APPENDECTOMY  1988  . CARDIAC CATHETERIZATION  2011   Dr Fletcher Anon  . CHOLECYSTECTOMY    . CORONARY ARTERY BYPASS GRAFT  2010   3v  . HERNIA REPAIR     abdominal  . HERNIA REPAIR    . TONSILLECTOMY AND ADENOIDECTOMY  1956      Home Meds: Prior to Admission medications   Medication Sig Start Date End Date Taking? Authorizing Provider  amiodarone (PACERONE) 200 MG tablet Take 0.5 tablets (100 mg total) by mouth 2 (two) times daily. Patient taking differently: Take 100 mg by mouth 2 (two) times daily as needed.  10/24/15  Yes [name], MD  amLODipine (NORVASC) 5 MG tablet Take 5 mg by mouth daily.  Yes Historical Provider, MD  apixaban (ELIQUIS) 5 MG TABS tablet Take 1 tablet (5 mg total) by mouth 2 (two) times daily. 02/12/15  Yes [name], MD  atorvastatin (LIPITOR) 40 MG tablet Take 40 mg by mouth at bedtime.   Yes Historical Provider, MD  budesonide-formoterol (SYMBICORT) 160-4.5 MCG/ACT inhaler Inhale 2 puffs into the lungs 2 (two) times daily.   Yes Historical Provider, MD  Coenzyme Q10 (CO Q-10) 100 MG CAPS Take 100 mg by mouth at bedtime.    Yes Historical Provider, MD  furosemide (LASIX) 20 MG tablet Take 20 mg by mouth 2 (two) times daily as needed for edema.    Yes Historical Provider, MD  metolazone (ZAROXOLYN) 5 MG tablet Take 1 tablet (5 mg total) by mouth daily as needed. 01/11/16   Yes [name], MD  metoprolol tartrate (LOPRESSOR) 25 MG tablet Take 25 mg by mouth 2 (two) times daily.    Yes Historical Provider, MD  mirtazapine (REMERON) 15 MG tablet Take 1 tablet (15 mg total) by mouth at bedtime. 11/09/15  Yes [name], MD  Multiple Vitamin (MULTIVITAMIN WITH MINERALS) TABS tablet Take 1 tablet by mouth daily.   Yes Historical Provider, MD  nitroGLYCERIN (NITROSTAT) 0.4 MG SL tablet Place 1 tablet (0.4 mg total) under the tongue every 5 (five) minutes as needed for chest pain. 11/03/15  Yes [name], MD  prazosin (MINIPRESS) 2 MG capsule Take 2 mg by mouth at bedtime.   Yes Historical Provider, MD  tiotropium (SPIRIVA) 18 MCG inhalation capsule Place 18 mcg into inhaler and inhale at bedtime.   Yes Historical Provider, MD  traMADol (ULTRAM) 50 MG tablet Take 1 tablet (50 mg total) by mouth every 6 (six) hours as needed for moderate pain. 01/28/16  Yes Cammie Sickle, MD    Current Medications: . [START ON 02/27/2016] amLODipine  5 mg Oral Daily  . apixaban  5 mg Oral BID  . atorvastatin  40 mg Oral QHS  . [START ON 02/27/2016] fluticasone furoate-vilanterol  1 puff Inhalation Daily  . mirtazapine  15 mg Oral QHS  . [START ON 02/27/2016] multivitamin with minerals  1 tablet Oral Daily  . prazosin  2 mg Oral QHS  . sodium chloride flush  3 mL Intravenous Q12H  . tiotropium  18 mcg Inhalation QHS     Allergies:   No Known Allergies  Social History:   The patient  reports that he quit smoking about 9 years ago. His smoking use included Cigarettes. He has a 40.00 pack-year smoking history. He has never used smokeless tobacco. He reports that he does not drink alcohol or use drugs.    Family History:   The patient's family history includes Heart disease in his mother.       PHYSICAL EXAM: VS:  BP (!) 157/71 (BP Location: Right Arm)   Pulse (!) 59   Temp 98.2 F (36.8 C) (Oral)   Resp 16   Ht 5\' 9"  (1.753 m)   Wt 230 lb (104.3 kg)   SpO2  100%   BMI 33.97 kg/m  , BMI Body mass index is 33.97 kg/m. GENERAL:  well developed, well nourished, obese, not in acute distress HEENT: normocephalic, pink conjunctivae, anicteric sclerae, no xanthelasma, normal dentition, oropharynx clear NECK:  no neck vein engorgement, JVP normal, no hepatojugular reflux, carotid upstroke brisk and symmetric, no bruit, no thyromegaly, no lymphadenopathy LUNGS:  good respiratory effort, clear to auscultation bilaterally CV:  PMI  not displaced, no thrills, no lifts, S1 and S2 within normal limits, no palpable S3 or S4, no murmurs, no rubs, no gallops ABD:  Soft, nontender, nondistended, normoactive bowel sounds, no abdominal aortic bruit, no hepatomegaly, no splenomegaly MS: nontender back, no kyphosis, no scoliosis, no joint deformities EXT:  2+ DP/PT pulses, no edema, no varicosities, no cyanosis, no clubbing SKIN: warm, nondiaphoretic, normal turgor, no ulcers NEUROPSYCH: alert, oriented to person, place, and time, sensory/motor grossly intact, normal mood, appropriate affect    EKG:  EKG from 02/26/2016 was personally reviewed by me and showed sinus bradycardia 41 BPM, first-degree AV block, nonspecific ST changes.  Labs:  Recent Labs  02/26/16 1058  TROPONINI <0.03   Lab Results  Component Value Date   WBC 6.5 02/26/2016   HGB 13.1 02/26/2016   HCT 37.8 (L) 02/26/2016   MCV 94.0 02/26/2016   PLT 115 (L) 02/26/2016    Recent Labs Lab 02/26/16 1058  NA 139  K 3.7  CL 108  CO2 27  BUN 14  CREATININE 0.91  CALCIUM 8.8*  PROT 7.0  BILITOT 1.6*  ALKPHOS 79  ALT 37  AST 23  GLUCOSE 131*   Lab Results  Component Value Date   CHOL 142 08/23/2015   HDL 33 (L) 08/23/2015   LDLCALC 76 08/23/2015   TRIG 167 (H) 08/23/2015   No results found for: DDIMER  Radiology/Studies:  Dg Chest Port 1 View  Result Date: 02/26/2016 CLINICAL DATA:  70 year old male with a history of left-sided chest pressure EXAM: PORTABLE CHEST 1 VIEW  COMPARISON:  09/14/2015 FINDINGS: Cardiomediastinal silhouette unchanged. 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 Defibrillator pads project over the chest. Linear opacity at the left base is unchanged from comparison. No pneumothorax. No confluent airspace disease. No large pleural effusion. No displaced fractures identified. IMPRESSION: Chronic lung changes with basilar atelectasis/ scarring, and no radiographic evidence of superimposed acute cardiopulmonary disease. Surgical changes of median sternotomy and CABG. Signed, Dulcy Fanny. Earleen Newport, DO [facility] Radiology Electronically Signed   By: Corrie Mckusick D.O.   On: 02/26/2016 12:01    Echo 11/02/2015: Procedure narrative: Transthoracic echocardiography. Image   quality was poor. The study was technically difficult, as a   result of poor sound wave transmission. - Left ventricle: The cavity size was mildly dilated. There was   mild concentric hypertrophy. Systolic function was normal. The   estimated ejection fraction was in the range of 55% to 60%. Wall   motion was normal; there were no regional wall motion   abnormalities. Left ventricular diastolic function parameters   were normal. - Left atrium: The atrium was mildly dilated. - Right ventricle: Systolic function was normal. - Pulmonary arteries: Systolic pressure was within the normal   range.  Nuclear stress is 08/23/2015:  There was no ST segment deviation noted during stress.  T wave inversion was noted during stress in the V1 and V2 leads, beginning at 0 minutes of stress. T wave inversion persisted.   Pharmacological myocardial perfusion imaging study with no significant  Ischemia Attenuation corrected images with region of fixed  thinning noted in the distal anterior and apical region consistent with old scar.  This perfusion abnormality was not seen on non-attenuation corrected images. Normal wall motion, EF  estimated at 50% No EKG changes concerning for ischemia at peak stress or in recovery. Low risk scan   PROBLEM LIST:  Active Problems:   Chest pain  ASSESSMENT AND PLAN:  Sinus bradycardia, first-degree AV block The EKG from today was similar to EKG from the cardiology office in June and July of this year. Review of medical records showed that his heart rate has been in the 50s in previous office visits. Amiodarone and metoprolol have been withheld due to significant bradycardia. Pt also noted to have 2 second sinus pauses telemetry. Patient asymptomatic  Recent stress testing from February 2017 did not reveal ischemia LVEF has been within normal limits from echocardiogram April 2017 Recommend to check magnesium and thyroid function tests Continue close monitoring. Continue to trend enzymes  CAD s/p CABG as above:  He had episode of chest pain today. Awaiting the rest of troponins. Beta blocker being with held for now due to the bradycardia No ischemia from most recent stress testing. Will trend troponins. Wall start him on Imdur ER 30 mg by mouth daily. Continue statin therapy. ?Not on ASA due to thrombocytopenia and CLL.  Hypertension Blood pressure was significantly elevated today. Beta blocker on hold. He had issues with pitting edema on amlodipine. Will monitor on Imdur 30 mg by mouth daily. If needed, may also utilize hydralazine, ACE inhibitor or ARB's.  PAF/atrial flutter:  CHADS2VASc of at least 4 (CHF, HTN, age x 1, vascular disease). He remains in sinus rhythm with a heart rate in the 50s in previous office visits. Continue Eliquis. We'll have to hold off on metoprolol, and amiodarone for now.  Chronic diastolic CHF: He does not appear to be volume overloaded at this time.   History of lower extremity edema from calcium channel blockers/amlodipine   COPD: Per primary. He does not appear to be having an active exacerbation. Stable.   Total encounter  time was 23mins   Signed, Wende Bushy, MD  02/26/2016 3:16 PM  [place]

## 2016-02-26 NOTE — Telephone Encounter (Signed)
Pt states his BPis 167/73, HR 48. Asks if he needs to go to the ED. States he is dizzy and doesn't feel good.

## 2016-02-26 NOTE — ED Notes (Signed)
Pt had CABG in 2010.

## 2016-02-26 NOTE — Progress Notes (Signed)
Patient has had three 2+second pauses.  Dr. Yvone Neu aware

## 2016-02-26 NOTE — ED Provider Notes (Signed)
[facility] Emergency Department Provider Note    First MD Initiated Contact with Patient 02/26/16 1054     (approximate)  I have reviewed the triage vital signs and the nursing notes.   HISTORY  Chief Complaint Chest Pain    HPI MIHRAN LEIBA is a 70 y.o. male with history of A. fib on Eliquis as well as multivessel coronary disease status post CABG in 2010 performed in [place] presents with a one week of generalized weakness as well as dizziness upon standing and then nonexertional chest pain and pressure rated at 2 out of 10 in severity that started this morning around 8 AM while the patient was at rest watching TV. States that the pain worsened upon exertion. States it did not radiate through to his back. Reports it as a dull achy pain is isolated over the left precordium. Denies any abdominal pain nausea or vomiting. Denies any productive cough or fevers. Did not take an aspirin a day but didn't take his Eliquis. Denies any recent medication changes.   Past Medical History:  Diagnosis Date  . Arthritis   . Arthritis   . Atrial fibrillation ([place])    a. Dx 2013, recurred 02/2014, CHA2DS2VASc = 3 -->placed on Eliquis;  b. 02/2014 Echo: EF 50-55%, mid and apical anterior septum and mid and apical inf septum are abnl, mild to mod Ao sclerosis w/o AS.  Marland Kitchen Chicken pox   . Chronic lymphocytic leukemia ([place])    a. Dx 02/2014.  Marland Kitchen COPD (chronic obstructive pulmonary disease) ([place])   . Coronary artery disease    a. 04/2009 CABG x 3 (LIMA->LAD, VG->OM1, VG->PDA);  b. 09/2009 Cath: occluded VG x 2 w/ patent LIMA and L->R collats. EF 55%, mild antlat HK;  c. 10/2011 MV: EF 53%, no isch/infarct-->low risk.  Marland Kitchen History of arthritis   . Hypertension   . OSA (obstructive sleep apnea)    a. On CPAP  . Pure hypercholesterolemia   . Rheumatic fever     Patient Active Problem List   Diagnosis Date Noted  . Bilateral edema of lower extremity 01/11/2016  . Coronary  artery disease involving coronary bypass graft of native heart without angina pectoris   . PAD (peripheral artery disease) ([place])   . OSA on CPAP   . TIA (transient ischemic attack) 11/02/2015  . Chronic diastolic CHF (congestive heart failure) ([place]) 09/20/2015  . Atrial flutter ([place]) 09/17/2015  . Atrial fibrillation with RVR ([place]) 09/14/2015  . Pre-syncope 08/23/2015  . Near syncope   . CLL (chronic lymphocytic leukemia) ([place])   . Bradycardia   . PTSD (post-traumatic stress disorder)   . Chest pain 08/22/2015  . Osteoarthritis of both knees 07/05/2015  . Chronic lymphatic leukemia.  Stage 2 given leukocytosis, lymphadenopathy and splenomegaly. Sept 2015 - Rapidly progressive bulky lymphadenopathy. Oct 2015 - Positive for ZAP-70 and CD38. 03/16/2014  . Medicare annual wellness visit, subsequent 07/08/2012  . Screening for colon cancer 01/07/2012  . COPD (chronic obstructive pulmonary disease) ([place]) 01/01/2012  . Shortness of breath 10/09/2011  . Paroxysmal atrial fibrillation ([place]) 10/09/2011  . HYPERCHOLESTEROLEMIA 11/28/2009  . HYPERTENSION, BENIGN 11/28/2009  . CAD, ARTERY BYPASS GRAFT 11/28/2009  . TOBACCO ABUSE, HX OF 11/28/2009    Past Surgical History:  Procedure Laterality Date  . APPENDECTOMY  1988  . CARDIAC CATHETERIZATION  2011   Dr Fletcher Anon  . CHOLECYSTECTOMY    . CORONARY ARTERY BYPASS GRAFT  2010   3v  . HERNIA REPAIR  abdominal  . HERNIA REPAIR    . TONSILLECTOMY AND ADENOIDECTOMY  1956    Prior to Admission medications   Medication Sig Start Date End Date Taking? Authorizing Provider  amiodarone (PACERONE) 200 MG tablet Take 0.5 tablets (100 mg total) by mouth 2 (two) times daily. 10/24/15   [name], MD  apixaban (ELIQUIS) 5 MG TABS tablet Take 1 tablet (5 mg total) by mouth 2 (two) times daily. 02/12/15   [name], MD  atorvastatin (LIPITOR) 40 MG tablet Take 40 mg by mouth at bedtime.    Historical Provider, MD  budesonide-formoterol  (SYMBICORT) 160-4.5 MCG/ACT inhaler Inhale 2 puffs into the lungs 2 (two) times daily.    Historical Provider, MD  Coenzyme Q10 (CO Q-10) 100 MG CAPS Take 100 mg by mouth at bedtime.     Historical Provider, MD  furosemide (LASIX) 20 MG tablet Take 80 mg by mouth as needed for edema.     Historical Provider, MD  metolazone (ZAROXOLYN) 5 MG tablet Take 1 tablet (5 mg total) by mouth daily as needed. 01/11/16   [name], MD  metoprolol tartrate (LOPRESSOR) 25 MG tablet Take 25 mg by mouth daily.    Historical Provider, MD  mirtazapine (REMERON) 15 MG tablet Take 1 tablet (15 mg total) by mouth at bedtime. 11/09/15   [name], MD  Multiple Vitamin (MULTIVITAMIN WITH MINERALS) TABS tablet Take 1 tablet by mouth daily.    Historical Provider, MD  nitroGLYCERIN (NITROSTAT) 0.4 MG SL tablet Place 1 tablet (0.4 mg total) under the tongue every 5 (five) minutes as needed for chest pain. 11/03/15   [name], MD  prazosin (MINIPRESS) 2 MG capsule Take 2 mg by mouth at bedtime.    Historical Provider, MD  tiotropium (SPIRIVA) 18 MCG inhalation capsule Place 18 mcg into inhaler and inhale at bedtime.    Historical Provider, MD  traMADol (ULTRAM) 50 MG tablet Take 1 tablet (50 mg total) by mouth every 6 (six) hours as needed for moderate pain. 01/28/16   Cammie Sickle, MD    Allergies Review of patient's allergies indicates no known allergies.  Family History  Problem Relation Age of Onset  . Heart disease Mother   . Coronary artery disease      family history    Social History Social History  Substance Use Topics  . Smoking status: Former Smoker    Packs/day: 1.00    Years: 40.00    Types: Cigarettes    Quit date: 07/21/2006  . Smokeless tobacco: Never Used  . Alcohol use No     Comment: occasionally    Review of Systems Patient denies headaches, rhinorrhea, blurry vision, numbness, shortness of breath, chest pain, edema, cough, abdominal pain, nausea, vomiting,  diarrhea, dysuria, fevers, rashes or hallucinations unless otherwise stated above in HPI. ____________________________________________   PHYSICAL EXAM:  VITAL SIGNS: Vitals:   02/26/16 1056  BP: (!) 164/74  Pulse: (!) 41  Resp: 18  Temp: 97.5 F (36.4 C)    Constitutional: Alert and oriented. Fatigued appearing Eyes: Conjunctivae are normal. PERRL. EOMI. Head: Atraumatic. Nose: No congestion/rhinnorhea. Mouth/Throat: Mucous membranes are moist.  Oropharynx non-erythematous. Neck: No stridor. Painless ROM. No cervical spine tenderness to palpation Hematological/Lymphatic/Immunilogical: No cervical lymphadenopathy. Cardiovascular: bradycardic. Grossly normal heart sounds.  Good peripheral circulation. Respiratory: Normal respiratory effort.  No retractions. Lungs CTAB. Gastrointestinal: Soft and nontender. No distention. No abdominal bruits. No CVA tenderness. Musculoskeletal: No lower extremity tenderness nor edema.  No joint effusions. Neurologic:  Normal speech and language. No gross focal neurologic deficits are appreciated. No gait instability. Skin:  Skin is warm, dry and intact. No rash noted. Psychiatric: Mood and affect are normal. Speech and behavior are normal. ___________________________________   LABS (all labs ordered are listed, but only abnormal results are displayed)  No results found for this or any previous visit (from the past 24 hour(s)). ____________________________________________  EKG  Time: 10:33  Indication: chest pain  Rate: 40  Rhythm: sinus bradycardia, normal intervals Axis: normal Other: newly flipped [place] in V1 and V2,  No acute ST elevations ____________________________________________  RADIOLOGY  CXR  my read shows no evidence of acute cardiopulmonary process.  ____________________________________________   PROCEDURES  Procedure(s) performed: none    Critical Care performed:  no ____________________________________________   INITIAL IMPRESSION / ASSESSMENT AND PLAN / ED COURSE  Pertinent labs & imaging results that were available during my care of the patient were reviewed by me and considered in my medical decision making (see chart for details).  DDX: acs, pna, bronchitis, pericarditis, dissection, pe  DONDRELL BAYLES is a 70 y.o. who presents to the ED with Significant history of coronary artery disease presents with midsternal chest pain and bradycardia as well as generalized weakness. Based on his underlying coronary disease and Comorbidities, we'll check blood work in addition to imaging and EKG to evaluate for evidence of acute ACS.  Clinical Course  Comment By Time  Trop negative.  Normotensive now.  Will page cardiology. Merlyn Lot, MD 08/08 1146  I spoke with Dr. Farrel Conners of cardiology who agrees with admission for chest pain rule out in light of his EKG changes and history of coronary disease with persistent chest pain.  Do not feel his presentation is consistent with acute infectious process, dissection or PE as his artery on anticoagulation and does not have any ripping or tearing chest pain through to his back. As initial troponin is negative and patient is on Eliquis will hold heparinization at this time. Have paged hospitalist for admission.  Have discussed with the patient and available family all diagnostics and treatments performed thus far and all questions were answered to the best of my ability. The patient demonstrates understanding and agreement with plan.  Merlyn Lot, MD 08/08 1225     ____________________________________________   FINAL CLINICAL IMPRESSION(S) / ED DIAGNOSES  Final diagnoses:  Chest pain, unspecified chest pain type      NEW MEDICATIONS STARTED DURING THIS VISIT:  New Prescriptions   No medications on file     Note:  This document was prepared using Dragon voice recognition software and may include  unintentional dictation errors.    Merlyn Lot, MD 02/26/16 [phone]

## 2016-02-26 NOTE — Progress Notes (Signed)
Patient has had an additional three 2+ second pauses.  Dr. Yvone Neu aware.   Patient moved from 233 to 243 closer to nursing station.   Asymptomatic.  No new orders.

## 2016-02-26 NOTE — Telephone Encounter (Signed)
Patient reports that he is having a hard time breathing and just does not feel well. He reports dizziness, uncomfortable feeling, and is audibly panting. Vitals reported 167/73 pulse 48 and 172/77 with pulse 48. Instructed him to go to local emergency room for evaluation as soon as possible. He sounds very short of breath and having a hard time talking with me. He verbalizes understanding of our conversation, agreement with plan of care, and had no further questions. Let him know that I would call ED to let them know he would be coming in. Notified Scientist, forensic charge nurse that patient would be coming in via private vehicle.

## 2016-02-26 NOTE — ED Triage Notes (Signed)
Pt presents with left sided chest pressure started this am while sitting at home with shortness of breath. Pt with hx of triple bypass.

## 2016-02-27 ENCOUNTER — Telehealth: Payer: Self-pay | Admitting: Cardiovascular Disease

## 2016-02-27 DIAGNOSIS — I959 Hypotension, unspecified: Secondary | ICD-10-CM | POA: Diagnosis not present

## 2016-02-27 DIAGNOSIS — R001 Bradycardia, unspecified: Secondary | ICD-10-CM | POA: Diagnosis not present

## 2016-02-27 DIAGNOSIS — I25810 Atherosclerosis of coronary artery bypass graft(s) without angina pectoris: Secondary | ICD-10-CM

## 2016-02-27 DIAGNOSIS — R079 Chest pain, unspecified: Secondary | ICD-10-CM | POA: Diagnosis not present

## 2016-02-27 DIAGNOSIS — I25708 Atherosclerosis of coronary artery bypass graft(s), unspecified, with other forms of angina pectoris: Secondary | ICD-10-CM | POA: Diagnosis not present

## 2016-02-27 DIAGNOSIS — I440 Atrioventricular block, first degree: Secondary | ICD-10-CM | POA: Diagnosis not present

## 2016-02-27 DIAGNOSIS — E876 Hypokalemia: Secondary | ICD-10-CM | POA: Diagnosis not present

## 2016-02-27 LAB — BASIC METABOLIC PANEL
ANION GAP: 5 (ref 5–15)
BUN: 13 mg/dL (ref 6–20)
CHLORIDE: 109 mmol/L (ref 101–111)
CO2: 28 mmol/L (ref 22–32)
CREATININE: 0.99 mg/dL (ref 0.61–1.24)
Calcium: 8.3 mg/dL — ABNORMAL LOW (ref 8.9–10.3)
GFR calc non Af Amer: >60 mL/min (ref >60)
Glucose, Bld: 97 mg/dL (ref 65–99)
Potassium: 3.4 mmol/L — ABNORMAL LOW (ref 3.5–5.1)
SODIUM: 142 mmol/L (ref 135–145)

## 2016-02-27 LAB — CBC
HCT: 32.5 % — ABNORMAL LOW (ref 40.0–52.0)
HEMOGLOBIN: 11.5 g/dL — AB (ref 13.0–18.0)
MCH: 33.3 pg (ref 26.0–34.0)
MCHC: 35.4 g/dL (ref 32.0–36.0)
MCV: 93.9 fL (ref 80.0–100.0)
PLATELETS: 112 10*3/uL — AB (ref 150–440)
RBC: 3.46 MIL/uL — AB (ref 4.40–5.90)
RDW: 14.7 % — ABNORMAL HIGH (ref 11.5–14.5)
WBC: 7.4 10*3/uL (ref 3.8–10.6)

## 2016-02-27 LAB — TROPONIN I: Troponin I: <0.03 ng/mL (ref <0.03)

## 2016-02-27 LAB — LACTATE DEHYDROGENASE: LDH: 132 U/L (ref 98–192)

## 2016-02-27 MED ORDER — POTASSIUM CHLORIDE CRYS ER 20 MEQ PO TBCR
40.0000 meq | EXTENDED_RELEASE_TABLET | Freq: Once | ORAL | Status: AC
  Administered 2016-02-27: 40 meq via ORAL
  Filled 2016-02-27: qty 2

## 2016-02-27 NOTE — Progress Notes (Signed)
[place] [facility] [place] NAME: [name]    MR#:  [id number]  DATE OF BIRTH:  [birth year]  SUBJECTIVE:  CHIEF COMPLAINT:   Chief Complaint  Patient presents with  . Chest Pain  The patient is 70 year old Caucasian male with past medical history significant for history of atrial fibrillation, coronary artery disease, chronic lymphocytic leukemia, COPD, essential hypertension, obstructive sleep apnea who presents to the hospital with complaints of weakness, chest pressure, dizziness. On arrival to the hospital. He was noted to have heart rate in 40s. Amiodarone as well as metoprolol was stopped, however, he is a heart rate remained in 50s, he feels very somnolent now since he was given Remeron as night, denies any pain or shortness of breath  Review of Systems  Constitutional: Negative for chills, fever and weight loss.  HENT: Negative for congestion.   Eyes: Negative for blurred vision and double vision.  Respiratory: Negative for cough, sputum production, shortness of breath and wheezing.   Cardiovascular: Negative for chest pain, palpitations, orthopnea, leg swelling and PND.  Gastrointestinal: Negative for abdominal pain, blood in stool, constipation, diarrhea, nausea and vomiting.  Genitourinary: Negative for dysuria, frequency, hematuria and urgency.  Musculoskeletal: Negative for falls.  Neurological: Negative for dizziness, tremors, focal weakness and headaches.  Endo/Heme/Allergies: Does not bruise/bleed easily.  Psychiatric/Behavioral: Negative for depression. The patient does not have insomnia.     VITAL SIGNS: Blood pressure (!) 117/54, pulse (!) 45, temperature 98.1 F (36.7 C), temperature source Oral, resp. rate 16, height 5\' 9"  (1.753 m), weight 110 kg (242 lb 9.6 oz), SpO2 93 %.  PHYSICAL EXAMINATION:   GENERAL:  70 y.o.-year-old patient lying in the bed with no acute distress. Somnolent, slurring speech EYES: Pupils  equal, round, reactive to light and accommodation. No scleral icterus. Extraocular muscles intact.  HEENT: Head atraumatic, normocephalic. Oropharynx and nasopharynx clear.  NECK:  Supple, no jugular venous distention. No thyroid enlargement, no tenderness.  LUNGS: Normal breath sounds bilaterally, no wheezing, rales,rhonchi or crepitation. No use of accessory muscles of respiration.  CARDIOVASCULAR: S1, S2 , bradycardic, distant. 2/6 systolic murmur at the aortic auscultation site, no rubs, or gallops.  ABDOMEN: Soft, nontender, nondistended. Bowel sounds present. No organomegaly or mass.  EXTREMITIES: No pedal edema, cyanosis, or clubbing.  NEUROLOGIC: Cranial nerves II through XII are intact. Muscle strength 5/5 in all extremities. Sensation intact. Gait not checked.  PSYCHIATRIC: The patient is alert and oriented x 3.  SKIN: No obvious rash, lesion, or ulcer.   ORDERS/RESULTS REVIEWED:   CBC  Recent Labs Lab 02/26/16 1058 02/27/16 0133  WBC 6.5 7.4  HGB 13.1 11.5*  HCT 37.8* 32.5*  PLT 115* 112*  MCV 94.0 93.9  MCH 32.7 33.3  MCHC 34.8 35.4  RDW 14.8* 14.7*   ------------------------------------------------------------------------------------------------------------------  Chemistries   Recent Labs Lab 02/26/16 1058 02/26/16 1804 02/27/16 0133  NA 139  --  142  K 3.7  --  3.4*  CL 108  --  109  CO2 27  --  28  GLUCOSE 131*  --  97  BUN 14  --  13  CREATININE 0.91  --  0.99  CALCIUM 8.8*  --  8.3*  MG  --  2.0  --   AST 23  --   --   ALT 37  --   --   ALKPHOS 79  --   --   BILITOT 1.6*  --   --    ------------------------------------------------------------------------------------------------------------------  estimated creatinine clearance is 84.8 mL/min (by C-G formula based on SCr of 0.99 mg/dL). ------------------------------------------------------------------------------------------------------------------  Recent Labs  02/26/16 1058  TSH 1.256     Cardiac Enzymes  Recent Labs Lab 02/26/16 1058 02/26/16 1608 02/27/16 0133  TROPONINI <0.03 <0.03 <0.03   ------------------------------------------------------------------------------------------------------------------ Invalid input(s): POCBNP ---------------------------------------------------------------------------------------------------------------  RADIOLOGY: Dg Chest Port 1 View  Result Date: 02/26/2016 CLINICAL DATA:  70 year old male with a history of left-sided chest pressure EXAM: PORTABLE CHEST 1 VIEW COMPARISON:  09/14/2015 FINDINGS: Cardiomediastinal silhouette unchanged. 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 Defibrillator pads project over the chest. Linear opacity at the left base is unchanged from comparison. No pneumothorax. No confluent airspace disease. No large pleural effusion. No displaced fractures identified. IMPRESSION: Chronic lung changes with basilar atelectasis/ scarring, and no radiographic evidence of superimposed acute cardiopulmonary disease. Surgical changes of median sternotomy and CABG. Signed, Dulcy Fanny. Earleen Newport, DO [facility] Electronically Signed   By: Corrie Mckusick D.O.   On: 02/26/2016 12:01    EKG:  Orders placed or performed during the hospital encounter of 02/26/16  . EKG 12-Lead  . EKG 12-Lead    ASSESSMENT AND PLAN:  Active Problems:   Chest pain   First degree AV block #1. Chest pain, negative for cardiac injury, likely related to bradycardia, she was seen by cardiologist, most recent stress test was done in February 2017 and showed no ischemia, echocardiogram was done in April 2017 and revealed normal ejection fraction. Follow clinically, likely discharge home as well as heart rate improves #2. Sinus bradycardia, likely due to medications, rule out sick sinus syndrome, now off amiodarone and beta blocker, follow heart rates closely #3. Hypotension, improved.  Holding medications, etiology of hypotension is unclear at this time, possibly related to some element of dehydration, as patient was on Lasix at home, now stopped all blood pressure medications, follow closely and resume medications as needed #4. Hypokalemia, supplementing orally, recheck in the morning #5. Thrombocytopenia, platelet count was 171 in February 2017, however, declined over the past few months, get LDH to rule out hemolysis, etiology is unclear, patient may benefit from oncologist evaluation as outpatient, especially since patient is on Eliquis at home   Management plans discussed with the patient, family and they are in agreement.   DRUG ALLERGIES: No Known Allergies  CODE STATUS:     Code Status Orders        Start     Ordered   02/26/16 1243  Full code  Continuous     02/26/16 1243    Code Status History    Date Active Date Inactive Code Status Order ID Comments User Context   11/02/2015  2:34 PM 11/03/2015  5:34 PM Full Code [id number]  Gladstone Lighter, MD ED   08/22/2015  7:43 PM 08/23/2015  6:32 PM Full Code [id number]  Nicholes Mango, MD Inpatient      TOTAL TIME TAKING CARE OF THIS PATIENT: 40 minutes.    Theodoro Grist M.D on 02/27/2016 at 4:12 PM  Between 7am to 6pm - Pager - [phone]  After 6pm go to [email or web address] - password [email or web address] [facility] [facility]  Office  [phone]  CC: Primary care physician; [name], MD

## 2016-02-27 NOTE — Progress Notes (Signed)
Patient HR remained in the 40s-50s., he denied CP, N&V or being in any discomfort. Dr. Ara Kussmaul was notified of sinus pulse of 2.75. No order received. Patient has a pacer pads.

## 2016-02-27 NOTE — Progress Notes (Addendum)
Patient Name: [name] Date of Encounter: 02/27/2016  Patient Care Team: [name], MD as PCP - General (Internal Medicine) [name], MD as Consulting Physician (Cardiology)  PROBLEM LIST  Active Problems:   Chest pain   First degree AV block     SUBJECTIVE  Pt hada good night, slept very well. Still very sleepy this am. No CP. No SOB.   Received Remeron last night. He does not take this at home.  BP has been [id number] systolic. He received the Imdur at around 6pm. BP at 8pm was 0000000 systolic.   CURRENT MEDS . amLODipine  5 mg Oral Daily  . apixaban  5 mg Oral BID  . atorvastatin  40 mg Oral QHS  . fluticasone furoate-vilanterol  1 puff Inhalation Daily  . isosorbide mononitrate  30 mg Oral Daily  . mirtazapine  15 mg Oral QHS  . multivitamin with minerals  1 tablet Oral Daily  . prazosin  2 mg Oral QHS  . sodium chloride flush  3 mL Intravenous Q12H  . tiotropium  18 mcg Inhalation QHS    OBJECTIVE  Vitals:   02/26/16 2045 02/27/16 0147 02/27/16 0624 02/27/16 0911  BP: (!) 148/76 (!) 123/57 (!) 96/42 (!) 106/43  Pulse: (!) 51 (!) 44 (!) 49   Resp: 20 16 20    Temp: 98.1 F (36.7 C)  97.9 F (36.6 C)   TempSrc: Oral  Axillary   SpO2: 97%  93%   Weight:      Height:        Intake/Output Summary (Last 24 hours) at 02/27/16 1017 Last data filed at 02/27/16 1013  Gross per 24 hour  Intake              480 ml  Output             1480 ml  Net            -1000 ml   Filed Weights   02/26/16 1056 02/26/16 1509  Weight: 230 lb (104.3 kg) 242 lb 9.6 oz (110 kg)    PHYSICAL EXAM VS:  BP (!) 106/43 (BP Location: Left Arm)   Pulse (!) 49   Temp 97.9 F (36.6 C) (Axillary)   Resp 20   Ht 5\' 9"  (1.753 m)   Wt 242 lb 9.6 oz (110 kg)   SpO2 93%   BMI 35.83 kg/m  , BMI Body mass index is 35.83 kg/m. GENERAL:  well developed, well nourished, obese, not in acute distress HEENT: normocephalic, pink conjunctivae, anicteric sclerae, no  xanthelasma, normal dentition, oropharynx clear NECK:  no neck vein engorgement, JVP normal, no hepatojugular reflux, carotid upstroke brisk and symmetric, no bruit, no thyromegaly, no lymphadenopathy LUNGS:  good respiratory effort, clear to auscultation bilaterally CV:  PMI not displaced, no thrills, no lifts, S1 and S2 within normal limits, no palpable S3 or S4, no murmurs, no rubs, no gallops ABD:  Soft, nontender, nondistended, normoactive bowel sounds, no abdominal aortic bruit, no hepatomegaly, no splenomegaly MS: nontender back, no kyphosis, no scoliosis, no joint deformities EXT:  2+ DP/PT pulses, no edema, no varicosities, no cyanosis, no clubbing SKIN: warm, nondiaphoretic, normal turgor, no ulcers NEUROPSYCH: alert, oriented to person, place, and time, sensory/motor grossly intact, normal mood, appropriate affect   Accessory Clinical Findings  CBC  Recent Labs  02/26/16 1058 02/27/16 0133  WBC 6.5 7.4  HGB 13.1 11.5*  HCT 37.8* 32.5*  MCV 94.0 93.9  PLT  115* [id number]*   Basic Metabolic Panel  Recent Labs  02/26/16 1058 02/26/16 1804 02/27/16 0133  NA 139  --  142  K 3.7  --  3.4*  CL 108  --  109  CO2 27  --  28  GLUCOSE 131*  --  97  BUN 14  --  13  CREATININE 0.91  --  0.99  CALCIUM 8.8*  --  8.3*  MG  --  2.0  --    Liver Function Tests  Recent Labs  02/26/16 1058  AST 23  ALT 37  ALKPHOS 79  BILITOT 1.6*  PROT 7.0  ALBUMIN 4.3   No results for input(s): LIPASE, AMYLASE in the last 72 hours. Cardiac Enzymes  Recent Labs  02/26/16 1058 02/26/16 1608 02/27/16 0133  TROPONINI <0.03 <0.03 <0.03   BNP (last 3 results)  Recent Labs  02/26/16 1122  BNP 248.0*   D-Dimer No results for input(s): DDIMER in the last 72 hours. Hemoglobin A1C No results for input(s): HGBA1C in the last 72 hours. Fasting Lipid Panel No results for input(s): CHOL, HDL, LDLCALC, TRIG, CHOLHDL, LDLDIRECT in the last 72 hours. Thyroid Function Tests  Recent  Labs  02/26/16 1058  TSH 1.256    TELE  SB in 40s, no pauses > 3s   RADIOLOGY/STUDIES  Dg Chest Port 1 View  Result Date: 02/26/2016 CLINICAL DATA:  70 year old male with a history of left-sided chest pressure EXAM: PORTABLE CHEST 1 VIEW COMPARISON:  09/14/2015 FINDINGS: Cardiomediastinal silhouette unchanged. 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 Defibrillator pads project over the chest. Linear opacity at the left base is unchanged from comparison. No pneumothorax. No confluent airspace disease. No large pleural effusion. No displaced fractures identified. IMPRESSION: Chronic lung changes with basilar atelectasis/ scarring, and no radiographic evidence of superimposed acute cardiopulmonary disease. Surgical changes of median sternotomy and CABG. Signed, Dulcy Fanny. Earleen Newport, DO [facility] Electronically Signed   By: Corrie Mckusick D.O.   On: 02/26/2016 12:01    ASSESSMENT AND PLAN  Sinus bradycardia, first-degree AV block The EKG from admission was similar to EKG from the cardiology office in June and July of this year. Review of medical records showed that his heart rate has been in the 50s in previous office visits. Amiodarone and metoprolol have been withheld due to significant bradycardia. Pt also noted to have < 3s sinus pauses on telemetry. Patient asymptomatic. Recent stress testing from February 2017 did not reveal ischemia LVEF has been within normal limits from echocardiogram April 2017 Continue close monitoring. Still holding BB and Amio  CAD s/p CABG as above:  He had episode of chest pain on admisison. ACS has been ruled out. No recurrence of CP Beta blocker being with held for now due to the bradycardia No ischemia from most recent stress testing.  Will retry Imdur ER 30 mg by mouth daily later today. Pt very sleepy and BP on low side -- likely med related/Remeron. Continue statin therapy. ?Not on  ASA due to thrombocytopenia and CLL.  Hypertension initially, now BP relatively lower Discontinued Remeron Amlodipine ordered but pt did not receive. This is now discontinued -- pt gets pedal edema from this.  Will monitor  And if BP goes up, will retry Imdur 30 mg by mouth daily.  PAF/atrial flutter:  CHADS2VASc of at least 4 (CHF, HTN, age x 1, vascular disease). He remains in sinus rhythm with a heart rate in the 50s in  previous office visits. Continue Eliquis. We'll have to hold off on metoprolol, and amiodarone for now.  Chronic diastolic CHF: He does not appear to be volume overloaded at this time.   History of lower extremity edema from calcium channel blockers/amlodipine   COPD: Per primary. He does not appear to be having an active exacerbation. Stable.   Total encounter time 35 mins  Signed, Wende Bushy, MD  02/27/2016, 10:17 AM  [place]

## 2016-02-27 NOTE — Telephone Encounter (Signed)
Pt daughter called, concerned about patient care as inpatient. She is requesting a physician visit her father while he is in the hospital. States she is not happy with they physician assistant that has seen her father. She states she feels as if her father has degressed, and would like a Dr. To evaluate him.

## 2016-02-27 NOTE — Telephone Encounter (Signed)
Patient is in Room 243

## 2016-02-27 NOTE — Progress Notes (Signed)
Pt has home CPAP. Pt and family will place when pt is ready to go on. CPAP plugged into red outlet. No sign of damage to machine.

## 2016-02-27 NOTE — Telephone Encounter (Signed)
Patients daughter called in upset stating her father is not doing well and she wanted a cardiologist to come see him. She is requesting someone that knows him. Let her know that the cardiologist covering the hospital today is Dr. Yvone Neu and that she was just seeing him in the room while she was there. She states that she is still concerned because he is not that alert. Reviewed note for today with her and discussed medication changes that were made and why. She verbalized understanding and she had thought Dr. Yvone Neu was a physician assistant. Let her know that she was in fact the cardiologist covering today and seeing her father. Dr. Yvone Neu then spoke to her on the phone to address her concerns.

## 2016-02-27 NOTE — Progress Notes (Signed)
PT Cancellation Note  Patient Details Name: [name] MRN: [id number] DOB: 04-26-1946   Cancelled Treatment:    Reason Eval/Treat Not Completed: Medical issues which prohibited therapy. Order received, chart reviewed. Per discussion with RN, pt has been symptomatically hypotensive this AM with significant fatigue and bouts of vertigo. RN requesting PT hold evaluation at this time. Will re-attempt PT eval at a later date/time.   Finnigan Warriner, SPT 02/27/2016, 1:29 PM

## 2016-02-27 NOTE — Progress Notes (Signed)
Spoke with dr. Ether Griffins to make aware of patients blood pressure and hr. 106/43 hr 54. Per md hold scheduled dose of imdur and norvasc. Will continue to monitor

## 2016-02-28 DIAGNOSIS — E876 Hypokalemia: Secondary | ICD-10-CM | POA: Diagnosis not present

## 2016-02-28 DIAGNOSIS — R001 Bradycardia, unspecified: Secondary | ICD-10-CM | POA: Diagnosis not present

## 2016-02-28 DIAGNOSIS — I959 Hypotension, unspecified: Secondary | ICD-10-CM | POA: Diagnosis not present

## 2016-02-28 DIAGNOSIS — I480 Paroxysmal atrial fibrillation: Secondary | ICD-10-CM | POA: Diagnosis not present

## 2016-02-28 DIAGNOSIS — R079 Chest pain, unspecified: Secondary | ICD-10-CM | POA: Diagnosis not present

## 2016-02-28 NOTE — Evaluation (Signed)
Physical Therapy Evaluation Patient Details Name: [name] MRN: [id number] DOB: 06/10/1946 Today's Date: 02/28/2016   History of Present Illness  Pt is a 70 y.o. male presenting to hospital with chest pain (non-exertional) and 1 week generalized weakness and dizziness with standing.  Pt admitted to hospital with chest pain, sinus bradycardia, and 1st degree AV block.  Pt with h/o HR in 50's per chart review.  Pt with multiple <3 second pauses during hospital stay.  PMH includes PTSD, CABG, a-fib, chronic lymphocytic leukemia, COPD, htn, and OSA.  Clinical Impression  Prior to admission, pt was independent with functional mobility but limited with distance ambulated d/t SOB (baseline from COPD per pt report).  Pt lives with his wife in 1 level home with 7 stairs to enter.  Currently pt is CGA with transfers and ambulation household distances without AD (limited distance d/t SOB; chair follow for safety).  No overt loss of balance noted but pt with mild unsteadiness; anticipate pt would benefit from use of SPC (pt agreeable with this and has one at home he occasionally uses if feeling unsteady or weak).  Pt would benefit from skilled PT to address noted impairments and functional limitations.  Recommend pt discharge to home with support of wife and HHPT (pending pt's progress) when medically appropriate.     Follow Up Recommendations Home health PT (pending progress)    Equipment Recommendations   (pt has SPC at home already)    Recommendations for Other Services       Precautions / Restrictions Precautions Precautions: Fall Restrictions Weight Bearing Restrictions: No      Mobility  Bed Mobility               General bed mobility comments: Pt sitting up in chair upon entering room and sat in chair end of session so not assessed.  Transfers Overall transfer level: Needs assistance Equipment used: None Transfers: Sit to/from Stand CGA           General transfer  comment: steady without loss of balance although mild increased effort noted  Ambulation/Gait Ambulation/Gait assistance: Min guard;+2 safety/equipment (chair follow for safety) Ambulation Distance (Feet):  (50 feet; 120 feet; 80 feet) Assistive device: None   Gait velocity: decreased   General Gait Details: increased BOS with R LE with increased external rotation compared to L LE; mild unsteadiness but no overt loss of balance requiring assist to steady required; SOB noted with distance (pt reporting this is baseline with his COPD)  Stairs            Wheelchair Mobility    Modified Rankin (Stroke Patients Only)       Balance Overall balance assessment: Needs assistance Sitting-balance support: No upper extremity supported;Feet supported Sitting balance-Leahy Scale: Normal     Standing balance support: No upper extremity supported;During functional activity Standing balance-Leahy Scale: Good Standing balance comment: mild unsteadiness with ambulation noted but no overt loss of balance requiring assist to steady                             Pertinent Vitals/Pain Pain Assessment: No/denies pain  HR 57 bpm at rest and nursing reporting HR getting into 80's during ambulation (although max 68 bpm when PT checked monitor).  O2 sats WFL on RA during session.    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  Occasionally uses SPC if feeling unsteady or weak.  Pt reports 1 fall in past 6 months tripping in the barn.     Hand  Dominance        Extremity/Trunk Assessment   Upper Extremity Assessment: Generalized weakness           Lower Extremity Assessment: Generalized weakness         Communication   Communication: No difficulties  Cognition Arousal/Alertness: Awake/alert Behavior During Therapy: WFL for tasks assessed/performed Overall Cognitive Status: Within Functional Limits for tasks assessed                      General Comments General comments (skin integrity, edema, etc.): pt sitting in chair with wife present beginning of session  Nursing cleared pt for participation in physical therapy.  Pt agreeable to PT session.    Exercises General Exercises - Lower Extremity Long Arc Quad: AROM;Strengthening;Both;10 reps;Seated Hip Flexion/Marching: AROM;Strengthening;Both;10 reps;Seated      Assessment/Plan    PT Assessment Patient needs continued PT services  PT Diagnosis Generalized weakness;Difficulty walking   PT Problem List Decreased activity tolerance;Decreased balance;Decreased mobility  PT Treatment Interventions DME instruction;Gait training;Stair training;Functional mobility training;Therapeutic activities;Therapeutic exercise;Balance training;Patient/family education   PT Goals (Current goals can be found in the Care Plan section) Acute Rehab PT Goals Patient Stated Goal: to go home PT Goal Formulation: With patient/family Time For Goal Achievement: 03/13/16 Potential to Achieve Goals: Good    Frequency Min 2X/week   Barriers to discharge        Co-evaluation               End of Session Equipment Utilized During Treatment: Gait belt Activity Tolerance: Patient limited by fatigue Patient left: in chair;with call bell/phone within reach;with chair alarm set;with nursing/sitter in room;with family/visitor present Nurse Communication: Mobility status;Precautions    Functional Assessment Tool Used: AM-PAC without stairs Functional Limitation: Mobility:  Walking and moving around Mobility: Walking and Moving Around Current Status [phone]): At least 20 percent but less than 40 percent impaired, limited or restricted Mobility: Walking and Moving Around Goal Status [phone]): At least 1 percent but less than 20 percent impaired, limited or restricted    Time: 0939-1010 PT Time Calculation (min) (ACUTE ONLY): 31 min   Charges:   PT Evaluation $PT Eval Low Complexity: 1 Procedure PT Treatments $Therapeutic Exercise: 8-22 mins   PT G Codes:   PT G-Codes **NOT FOR INPATIENT CLASS** Functional Assessment Tool Used: AM-PAC without stairs Functional Limitation: Mobility: Walking and moving around Mobility: Walking and Moving Around Current Status [id number]): At least 20 percent but less than 40 percent impaired, limited or restricted Mobility: Walking and Moving Around Goal Status [phone]): At least 1 percent but less than 20 percent impaired, limited or restricted    [facility] 02/28/2016, 10:40 AM Leitha Bleak, [place]

## 2016-02-28 NOTE — Progress Notes (Signed)
Dr. Ether Griffins update on pt's vital signs, orders received to hold Imdur

## 2016-02-28 NOTE — Progress Notes (Signed)
Pt has 3.03 second pause, asymptomatic, Dr. Ether Griffins made aware, wants me to call Cardiology

## 2016-02-28 NOTE — Progress Notes (Addendum)
[facility] [place] NAME: [name]    MR#:  [id number]  DATE OF BIRTH:  June 01, 1946  SUBJECTIVE:  CHIEF COMPLAINT:   Chief Complaint  Patient presents with  . Chest Pain  The patient is 70 year old Caucasian male with past medical history significant for history of atrial fibrillation, coronary artery disease, chronic lymphocytic leukemia, COPD, essential hypertension, obstructive sleep apnea who presents to the hospital with complaints of weakness, chest pressure, dizziness. On arrival to the hospital. He was noted to have heart rate in 40s. Amiodarone as well as metoprolol was stopped, however, he is a heart rate remains in 40s, he feels very somnolent , Even though was not given Remeron last night, denies any pain or shortness of breath, feels weak. Had about 3 second pause earlier today, asymptomatic. Patient was given prazosin last night and feels worse.   Review of Systems  Constitutional: Negative for chills, fever and weight loss.  HENT: Negative for congestion.   Eyes: Negative for blurred vision and double vision.  Respiratory: Negative for cough, sputum production, shortness of breath and wheezing.   Cardiovascular: Negative for chest pain, palpitations, orthopnea, leg swelling and PND.  Gastrointestinal: Negative for abdominal pain, blood in stool, constipation, diarrhea, nausea and vomiting.  Genitourinary: Negative for dysuria, frequency, hematuria and urgency.  Musculoskeletal: Negative for falls.  Neurological: Negative for dizziness, tremors, focal weakness and headaches.  Endo/Heme/Allergies: Does not bruise/bleed easily.  Psychiatric/Behavioral: Negative for depression. The patient does not have insomnia.     VITAL SIGNS: Blood pressure 138/67, pulse 61, temperature 98 F (36.7 C), temperature source Oral, resp. rate 18, height 5\' 9"  (1.753 m), weight 110 kg (242 lb 9.6 oz), SpO2 96 %.  PHYSICAL EXAMINATION:    GENERAL:  70 y.o.-year-old patient lying in the bed with no acute distress. Remains somewhat somnolent, but does not slur speech EYES: Pupils equal, round, reactive to light and accommodation. No scleral icterus. Extraocular muscles intact.  HEENT: Head atraumatic, normocephalic. Oropharynx and nasopharynx clear.  NECK:  Supple, no jugular venous distention. No thyroid enlargement, no tenderness.  LUNGS: Normal breath sounds bilaterally, no wheezing, rales,rhonchi or crepitation. No use of accessory muscles of respiration.  CARDIOVASCULAR: S1, S2 , bradycardic, distant. No murmurs , no rubs, or gallops.  ABDOMEN: Soft, nontender, nondistended. Bowel sounds present. No organomegaly or mass.  EXTREMITIES: No pedal edema, cyanosis, or clubbing.  NEUROLOGIC: Cranial nerves II through XII are intact. Muscle strength 5/5 in all extremities. Sensation intact. Gait not checked.  PSYCHIATRIC: The patient is alert and oriented x 3.  SKIN: No obvious rash, lesion, or ulcer.   ORDERS/RESULTS REVIEWED:   CBC  Recent Labs Lab 02/26/16 1058 02/27/16 0133  WBC 6.5 7.4  HGB 13.1 11.5*  HCT 37.8* 32.5*  PLT 115* 112*  MCV 94.0 93.9  MCH 32.7 33.3  MCHC 34.8 35.4  RDW 14.8* 14.7*   ------------------------------------------------------------------------------------------------------------------  Chemistries   Recent Labs Lab 02/26/16 1058 02/26/16 1804 02/27/16 0133  NA 139  --  142  K 3.7  --  3.4*  CL 108  --  109  CO2 27  --  28  GLUCOSE 131*  --  97  BUN 14  --  13  CREATININE 0.91  --  0.99  CALCIUM 8.8*  --  8.3*  MG  --  2.0  --   AST 23  --   --   ALT 37  --   --  ALKPHOS 79  --   --   BILITOT 1.6*  --   --    ------------------------------------------------------------------------------------------------------------------ estimated creatinine clearance is 84.8 mL/min (by C-G formula based on SCr of 0.99  mg/dL). ------------------------------------------------------------------------------------------------------------------  Recent Labs  02/26/16 1058  TSH 1.256    Cardiac Enzymes  Recent Labs Lab 02/26/16 1058 02/26/16 1608 02/27/16 0133  TROPONINI <0.03 <0.03 <0.03   ------------------------------------------------------------------------------------------------------------------ Invalid input(s): POCBNP ---------------------------------------------------------------------------------------------------------------  RADIOLOGY: No results found.  EKG:  Orders placed or performed during the hospital encounter of 02/26/16  . EKG 12-Lead  . EKG 12-Lead    ASSESSMENT AND PLAN:  Principal Problem:   Symptomatic bradycardia Active Problems:   Paroxysmal atrial fibrillation (HCC)   Chest pain   PTSD (post-traumatic stress disorder)   First degree AV block #1. Chest pain, negative for cardiac injury, likely related to bradycardia, The patient was seen by cardiologist, most recent stress test was done in February 2017 and showed no ischemia, echocardiogram was done in April 2017 and revealed normal ejection fraction. Heart rate has improved with physical therapy, the patient had no recurrent chest pains. #2. Sinus bradycardia and first-degree AV block with 3 second pauses, likely due to medications, now off amiodarone and beta blocker, follow heart rates closely, improved with less physical activity #3. Hypotension, resolved. Holding medications, etiology of hypotension is unclear at this time, possibly related to some element of dehydration, as patient was on Lasix at home, now stopped , although patient was given prazosin last night and blood pressure dropped down to below [id number] systolic, with which patient felt weak. May need to discontinue prazosin altogether. #4. Hypokalemia, supplemented , recheck in the morning #5. Thrombocytopenia, platelet count was 171 in February 2017,  however, declined over the past few months, LDH was normal, unlikely hemolysis, etiology is unclear, patient may benefit from oncologist evaluation as outpatient, especially since patient is on Eliquis at home #6. Somnolence, initially thought due to Remeron, but now off Remeron for the past 24 hours, remains somnolent, could be generalized weakness due to bradycardia, getting physical therapist involved for recommendations, recommended home health services pending progress  Management plans discussed with the patient, family and they are in agreement.   DRUG ALLERGIES: No Known Allergies  CODE STATUS:     Code Status Orders        Start     Ordered   02/26/16 1243  Full code  Continuous     02/26/16 1243    Code Status History    Date Active Date Inactive Code Status Order ID Comments User Context   11/02/2015  2:34 PM 11/03/2015  5:34 PM Full Code [id number]  Gladstone Lighter, MD ED   08/22/2015  7:43 PM 08/23/2015  6:32 PM Full Code [id number]  Nicholes Mango, MD Inpatient      TOTAL TIME TAKING CARE OF THIS PATIENT: 30 minutes.    Theodoro Grist M.D on 02/28/2016 at 5:00 PM  Between 7am to 6pm - Pager - [phone]  After 6pm go to [email or web address] - password [email or web address] [facility]  Office  [phone]  CC: Primary care physician; [name], MD

## 2016-02-28 NOTE — Progress Notes (Signed)
Spoke with Dr. Fletcher Anon, update on pt's 3.03 pause, no new orders received.

## 2016-02-28 NOTE — Care Management (Signed)
Met with patient and his wife at bedside. He is from home. Uses a cane and CPAP. No home health or home O2. PCP is Dr. Gilford Rile @ [facility].  Wife assists with transportation. Denies issues obtaining medications, copays or medical care. Patient is active with the [facility]. He refused transfer. Refusal to transfer form signed. Faxed to [facility] at the [facility].

## 2016-02-28 NOTE — Care Management Obs Status (Signed)
[facility] NOTIFICATION   Patient Details  Name: KESHAV SHOWMAN MRN: [id number] Date of Birth: [birth year]   Medicare Observation Status Notification Given:  Yes    Jolly Mango, RN 02/28/2016, 9:15 AM

## 2016-02-28 NOTE — Progress Notes (Signed)
SUBJECTIVE: : He continues to feel weak but overall no chest pain or shortness of breath. He continues to be in sinus bradycardia with heart rate in the 50s.   Vitals:   02/27/16 2013 02/28/16 0101 02/28/16 0509 02/28/16 0808  BP: (!) 190/79 (!) 99/37 (!) 147/66 (!) 125/59  Pulse: 63 (!) 50 80 (!) 51  Resp:    17  Temp:    98.3 F (36.8 C)  TempSrc:    Oral  SpO2:   96% 96%  Weight:      Height:        Intake/Output Summary (Last 24 hours) at 02/28/16 0906 Last data filed at 02/28/16 0750  Gross per 24 hour  Intake              720 ml  Output             2100 ml  Net            -1380 ml    LABS: Basic Metabolic Panel:  Recent Labs  02/26/16 1058 02/26/16 1804 02/27/16 0133  NA 139  --  142  K 3.7  --  3.4*  CL 108  --  109  CO2 27  --  28  GLUCOSE 131*  --  97  BUN 14  --  13  CREATININE 0.91  --  0.99  CALCIUM 8.8*  --  8.3*  MG  --  2.0  --    Liver Function Tests:  Recent Labs  02/26/16 1058  AST 23  ALT 37  ALKPHOS 79  BILITOT 1.6*  PROT 7.0  ALBUMIN 4.3   No results for input(s): LIPASE, AMYLASE in the last 72 hours. CBC:  Recent Labs  02/26/16 1058 02/27/16 0133  WBC 6.5 7.4  HGB 13.1 11.5*  HCT 37.8* 32.5*  MCV 94.0 93.9  PLT 115* 112*   Cardiac Enzymes:  Recent Labs  02/26/16 1058 02/26/16 1608 02/27/16 0133  TROPONINI <0.03 <0.03 <0.03   BNP: Invalid input(s): POCBNP D-Dimer: No results for input(s): DDIMER in the last 72 hours. Hemoglobin A1C: No results for input(s): HGBA1C in the last 72 hours. Fasting Lipid Panel: No results for input(s): CHOL, HDL, LDLCALC, TRIG, CHOLHDL, LDLDIRECT in the last 72 hours. Thyroid Function Tests:  Recent Labs  02/26/16 1058  TSH 1.256   Anemia Panel: No results for input(s): VITAMINB12, FOLATE, FERRITIN, TIBC, IRON, RETICCTPCT in the last 72 hours.   PHYSICAL EXAM General: Well developed, well nourished, in no acute distress HEENT:  Normocephalic and atramatic Neck:  No  JVD.  Lungs: Clear bilaterally to auscultation and percussion. Heart: HRRR, Mildly bradycardic. Normal S1 and S2 without gallops or murmurs.  Abdomen: Bowel sounds are positive, abdomen soft and non-tender  Msk:  Back normal, normal gait. Normal strength and tone for age. Extremities: No clubbing, cyanosis or edema.   Neuro: Alert and oriented X 3. Psych:  Good affect, responds appropriately  TELEMETRY: Reviewed telemetry pt in sinus bradycardia with heart rate in the 50s  ASSESSMENT AND PLAN:  1. 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 It will take a few weeks for amiodarone to get out of his system.  2. Weakness: I'm not completely certain that the sinus bradycardia is the only thing responsible for his symptoms. There might be another etiology. He reports worsening symptoms after he was started on prazosin to help him sleep  at night. This was given to him at the [facility] for PTSD. Some of his symptoms might be related to prazosin and Remeron. Consider physical therapy evaluation.  3. History of atrial flutter/fibrillation: If the patient develops symptomatic episodes of tachycardia, he might require a pacemaker placement in order to effectively treat his tachycardia. Continue anticoagulation for now.  4. History of coronary artery disease status post CABG: No clear symptoms of angina. Continue medical therapy.   5. Essential hypertension: Blood pressure is reasonably controlled with some fluctuation. Avoid calcium tablet block due to significant leg edema in the past. If the blood pressure medication is needed, a small dose ACE inhibitor or ARB can be added.    Kathlyn Sacramento, MD, [facility] 02/28/2016 9:06 AM

## 2016-02-29 ENCOUNTER — Encounter (HOSPITAL_COMMUNITY): Payer: Self-pay | Admitting: General Practice

## 2016-02-29 ENCOUNTER — Inpatient Hospital Stay (HOSPITAL_COMMUNITY)
Admission: AD | Admit: 2016-02-29 | Discharge: 2016-03-04 | DRG: 243 | Disposition: A | Discharge status: Home / Self Care | Payer: Medicare HMO | Source: Other Acute Inpatient Hospital | Attending: Cardiovascular Disease | Admitting: Cardiovascular Disease

## 2016-02-29 DIAGNOSIS — I440 Atrioventricular block, first degree: Secondary | ICD-10-CM | POA: Diagnosis not present

## 2016-02-29 DIAGNOSIS — Z8249 Family history of ischemic heart disease and other diseases of the circulatory system: Secondary | ICD-10-CM

## 2016-02-29 DIAGNOSIS — I495 Sick sinus syndrome: Principal | ICD-10-CM | POA: Diagnosis present

## 2016-02-29 DIAGNOSIS — I959 Hypotension, unspecified: Secondary | ICD-10-CM | POA: Diagnosis not present

## 2016-02-29 DIAGNOSIS — I480 Paroxysmal atrial fibrillation: Secondary | ICD-10-CM | POA: Diagnosis present

## 2016-02-29 DIAGNOSIS — R69 Illness, unspecified: Secondary | ICD-10-CM | POA: Diagnosis not present

## 2016-02-29 DIAGNOSIS — R5383 Other fatigue: Secondary | ICD-10-CM | POA: Diagnosis not present

## 2016-02-29 DIAGNOSIS — Z7901 Long term (current) use of anticoagulants: Secondary | ICD-10-CM | POA: Diagnosis not present

## 2016-02-29 DIAGNOSIS — J449 Chronic obstructive pulmonary disease, unspecified: Secondary | ICD-10-CM | POA: Diagnosis not present

## 2016-02-29 DIAGNOSIS — I2510 Atherosclerotic heart disease of native coronary artery without angina pectoris: Secondary | ICD-10-CM | POA: Diagnosis present

## 2016-02-29 DIAGNOSIS — R079 Chest pain, unspecified: Secondary | ICD-10-CM | POA: Diagnosis not present

## 2016-02-29 DIAGNOSIS — Z87891 Personal history of nicotine dependence: Secondary | ICD-10-CM

## 2016-02-29 DIAGNOSIS — R5382 Chronic fatigue, unspecified: Secondary | ICD-10-CM | POA: Diagnosis not present

## 2016-02-29 DIAGNOSIS — Z951 Presence of aortocoronary bypass graft: Secondary | ICD-10-CM | POA: Diagnosis not present

## 2016-02-29 DIAGNOSIS — J9811 Atelectasis: Secondary | ICD-10-CM | POA: Diagnosis not present

## 2016-02-29 DIAGNOSIS — I110 Hypertensive heart disease with heart failure: Secondary | ICD-10-CM | POA: Diagnosis present

## 2016-02-29 DIAGNOSIS — G4733 Obstructive sleep apnea (adult) (pediatric): Secondary | ICD-10-CM | POA: Diagnosis present

## 2016-02-29 DIAGNOSIS — C9110 Chronic lymphocytic leukemia of B-cell type not having achieved remission: Secondary | ICD-10-CM | POA: Diagnosis not present

## 2016-02-29 DIAGNOSIS — D696 Thrombocytopenia, unspecified: Secondary | ICD-10-CM

## 2016-02-29 DIAGNOSIS — Z7951 Long term (current) use of inhaled steroids: Secondary | ICD-10-CM

## 2016-02-29 DIAGNOSIS — E7800 Pure hypercholesterolemia, unspecified: Secondary | ICD-10-CM | POA: Diagnosis present

## 2016-02-29 DIAGNOSIS — E876 Hypokalemia: Secondary | ICD-10-CM | POA: Diagnosis not present

## 2016-02-29 DIAGNOSIS — I5032 Chronic diastolic (congestive) heart failure: Secondary | ICD-10-CM | POA: Diagnosis not present

## 2016-02-29 DIAGNOSIS — F4310 Post-traumatic stress disorder, unspecified: Secondary | ICD-10-CM | POA: Diagnosis present

## 2016-02-29 DIAGNOSIS — R531 Weakness: Secondary | ICD-10-CM

## 2016-02-29 DIAGNOSIS — R001 Bradycardia, unspecified: Secondary | ICD-10-CM | POA: Diagnosis not present

## 2016-02-29 DIAGNOSIS — Z8619 Personal history of other infectious and parasitic diseases: Secondary | ICD-10-CM | POA: Diagnosis not present

## 2016-02-29 DIAGNOSIS — Z959 Presence of cardiac and vascular implant and graft, unspecified: Secondary | ICD-10-CM

## 2016-02-29 DIAGNOSIS — R0789 Other chest pain: Secondary | ICD-10-CM

## 2016-02-29 DIAGNOSIS — M1990 Unspecified osteoarthritis, unspecified site: Secondary | ICD-10-CM | POA: Diagnosis present

## 2016-02-29 DIAGNOSIS — I455 Other specified heart block: Secondary | ICD-10-CM

## 2016-02-29 DIAGNOSIS — R011 Cardiac murmur, unspecified: Secondary | ICD-10-CM | POA: Diagnosis not present

## 2016-02-29 DIAGNOSIS — I252 Old myocardial infarction: Secondary | ICD-10-CM | POA: Diagnosis not present

## 2016-02-29 HISTORY — DX: Obstructive sleep apnea (adult) (pediatric): G47.33

## 2016-02-29 HISTORY — DX: Post-traumatic stress disorder, unspecified: F43.10

## 2016-02-29 HISTORY — DX: Acute myocardial infarction, unspecified: I21.9

## 2016-02-29 HISTORY — DX: Dependence on other enabling machines and devices: Z99.89

## 2016-02-29 LAB — CBC
HEMATOCRIT: 35.6 % — AB (ref 40.0–52.0)
Hemoglobin: 12.6 g/dL — ABNORMAL LOW (ref 13.0–18.0)
MCH: 33.2 pg (ref 26.0–34.0)
MCHC: 35.3 g/dL (ref 32.0–36.0)
MCV: 94.1 fL (ref 80.0–100.0)
PLATELETS: 110 10*3/uL — AB (ref 150–440)
RBC: 3.79 MIL/uL — AB (ref 4.40–5.90)
RDW: 14.5 % (ref 11.5–14.5)
WBC: 6.9 10*3/uL (ref 3.8–10.6)

## 2016-02-29 LAB — POTASSIUM: POTASSIUM: 3.9 mmol/L (ref 3.5–5.1)

## 2016-02-29 MED ORDER — ATORVASTATIN CALCIUM 40 MG PO TABS
40.0000 mg | ORAL_TABLET | Freq: Every day | ORAL | Status: DC
  Administered 2016-02-29 – 2016-03-01 (×2): 40 mg via ORAL
  Filled 2016-02-29 (×2): qty 1

## 2016-02-29 MED ORDER — ACETAMINOPHEN 325 MG PO TABS
650.0000 mg | ORAL_TABLET | ORAL | Status: DC | PRN
  Administered 2016-03-01 – 2016-03-02 (×2): 650 mg via ORAL
  Filled 2016-02-29 (×2): qty 2

## 2016-02-29 MED ORDER — TIOTROPIUM BROMIDE MONOHYDRATE 18 MCG IN CAPS
18.0000 ug | ORAL_CAPSULE | Freq: Every day | RESPIRATORY_TRACT | Status: DC
  Administered 2016-02-29 – 2016-03-03 (×4): 18 ug via RESPIRATORY_TRACT
  Filled 2016-02-29: qty 5

## 2016-02-29 MED ORDER — FLUTICASONE FUROATE-VILANTEROL 200-25 MCG/INH IN AEPB
1.0000 | INHALATION_SPRAY | Freq: Every day | RESPIRATORY_TRACT | Status: DC
  Administered 2016-03-01 – 2016-03-04 (×5): 1 via RESPIRATORY_TRACT
  Filled 2016-02-29: qty 28

## 2016-02-29 MED ORDER — ISOSORBIDE MONONITRATE ER 30 MG PO TB24: 30.0000 mg | ORAL_TABLET | Freq: Every day | ORAL | 5 refills | Status: DC

## 2016-02-29 MED ORDER — TRAMADOL HCL 50 MG PO TABS: 50.0000 mg | ORAL_TABLET | Freq: Four times a day (QID) | ORAL | Status: DC | PRN

## 2016-02-29 MED ORDER — SODIUM CHLORIDE 0.9% FLUSH
3.0000 mL | Freq: Two times a day (BID) | INTRAVENOUS | Status: DC
  Administered 2016-02-29 – 2016-03-04 (×5): 3 mL via INTRAVENOUS

## 2016-02-29 MED ORDER — APIXABAN 5 MG PO TABS
5.0000 mg | ORAL_TABLET | Freq: Two times a day (BID) | ORAL | Status: AC
  Administered 2016-02-29: 5 mg via ORAL
  Filled 2016-02-29: qty 1

## 2016-02-29 MED ORDER — ISOSORBIDE MONONITRATE ER 30 MG PO TB24
30.0000 mg | ORAL_TABLET | Freq: Every day | ORAL | Status: DC
  Administered 2016-03-01 – 2016-03-04 (×4): 30 mg via ORAL
  Filled 2016-02-29 (×4): qty 1

## 2016-02-29 MED ORDER — HEPARIN (PORCINE) IN NACL 100-0.45 UNIT/ML-% IJ SOLN
1400.0000 [IU]/h | INTRAMUSCULAR | Status: DC
  Administered 2016-03-01 – 2016-03-02 (×2): 1400 [IU]/h via INTRAVENOUS
  Administered 2016-03-02: 1500 [IU]/h via INTRAVENOUS
  Filled 2016-02-29 (×3): qty 250

## 2016-02-29 MED ORDER — ADULT MULTIVITAMIN W/MINERALS CH
1.0000 | ORAL_TABLET | Freq: Every day | ORAL | Status: DC
  Administered 2016-03-01 – 2016-03-04 (×4): 1 via ORAL
  Filled 2016-02-29 (×4): qty 1

## 2016-02-29 MED ORDER — PRAZOSIN HCL 2 MG PO CAPS
2.0000 mg | ORAL_CAPSULE | Freq: Every day | ORAL | Status: DC
  Administered 2016-02-29 – 2016-03-03 (×4): 2 mg via ORAL
  Filled 2016-02-29 (×4): qty 1

## 2016-02-29 MED ORDER — ONDANSETRON HCL 4 MG/2ML IJ SOLN: 4.0000 mg | Freq: Four times a day (QID) | INTRAMUSCULAR | Status: DC | PRN

## 2016-02-29 NOTE — Progress Notes (Signed)
[facility] transferring patient to [facility].

## 2016-02-29 NOTE — Progress Notes (Signed)
SUBJECTIVE: :  He is more alert today. However, he continues to complain of weakness and dizziness. Heart rate today is in the 60s. However, he had episodes earlier this morning of heart rate down to the low 30s while he was awake with pauses close to 3 seconds.  Vitals:   02/28/16 1402 02/28/16 1440 02/28/16 1925 02/29/16 0415  BP:   (!) 166/78 (!) 120/50  Pulse: (!) 54 61 (!) 51 (!) 45  Resp:   18 20  Temp:   98.5 F (36.9 C) 98 F (36.7 C)  TempSrc:   Oral Oral  SpO2:   96% 93%  Weight:      Height:        Intake/Output Summary (Last 24 hours) at 02/29/16 0928 Last data filed at 02/29/16 0417  Gross per 24 hour  Intake              483 ml  Output             3050 ml  Net            -2567 ml    LABS: Basic Metabolic Panel:  Recent Labs  02/26/16 1058 02/26/16 1804 02/27/16 0133 02/29/16 0807  NA 139  --  142  --   K 3.7  --  3.4* 3.9  CL 108  --  109  --   CO2 27  --  28  --   GLUCOSE 131*  --  97  --   BUN 14  --  13  --   CREATININE 0.91  --  0.99  --   CALCIUM 8.8*  --  8.3*  --   MG  --  2.0  --   --    Liver Function Tests:  Recent Labs  02/26/16 1058  AST 23  ALT 37  ALKPHOS 79  BILITOT 1.6*  PROT 7.0  ALBUMIN 4.3   No results for input(s): LIPASE, AMYLASE in the last 72 hours. CBC:  Recent Labs  02/27/16 0133 02/29/16 0807  WBC 7.4 6.9  HGB 11.5* 12.6*  HCT 32.5* 35.6*  MCV 93.9 94.1  PLT 112* 110*   Cardiac Enzymes:  Recent Labs  02/26/16 1058 02/26/16 1608 02/27/16 0133  TROPONINI <0.03 <0.03 <0.03   BNP: Invalid input(s): POCBNP D-Dimer: No results for input(s): DDIMER in the last 72 hours. Hemoglobin A1C: No results for input(s): HGBA1C in the last 72 hours. Fasting Lipid Panel: No results for input(s): CHOL, HDL, LDLCALC, TRIG, CHOLHDL, LDLDIRECT in the last 72 hours. Thyroid Function Tests:  Recent Labs  02/26/16 1058  TSH 1.256   Anemia Panel: No results for input(s): VITAMINB12, FOLATE, FERRITIN, TIBC,  IRON, RETICCTPCT in the last 72 hours.   PHYSICAL EXAM General: Well developed, well nourished, in no acute distress HEENT:  Normocephalic and atramatic Neck:  No JVD.  Lungs: Clear bilaterally to auscultation and percussion. Heart: HRRR, Mildly bradycardic. Normal S1 and S2 without gallops . 2/6 SEM in aortic area.  Abdomen: Bowel sounds are positive, abdomen soft and non-tender  Msk:  Back normal, normal gait. Normal strength and tone for age. Extremities: No clubbing, cyanosis or edema.   Neuro: Alert and oriented X 3. Psych:  Good affect, responds appropriately  TELEMETRY: Reviewed telemetry pt in sinus bradycardia with heart rate in the 60s. Episodes of sinus bradycardia in 30s with recurrent <3 second pauses.   ASSESSMENT AND PLAN:  1. 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  Both amiodarone and metoprolol has been held since admission. However, the patient continues to have episodes of severe sinus bradycardia with pauses close to 3 seconds. It would take weeks for amiodarone to get out of his system. 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 I discussed this with the patient and his wife. I recommend transfer to [facility] for a pacemaker placement on Monday. The patient is currently on anticoagulation with Eliquis which can be held starting tomorrow.  2. Weakness:  Likely due to symptomatic sinus bradycardia and sinus node dysfunction.  3. History of atrial flutter/fibrillation:  He is currently in sinus rhythm but high risk for recurrent episodes off amiodarone. Amiodarone should be resumed after pacemaker placement.  4. History of coronary artery disease status post CABG: No clear symptoms of angina. Continue medical therapy.   5. Essential hypertension: Blood pressure is reasonably controlled with some fluctuation.  Avoid calcium tablet block due to significant leg edema in the past. If the blood pressure medication is needed, a small dose ACE inhibitor or ARB can be added.    Kathlyn Sacramento, MD, [facility] 02/29/2016 9:28 AM

## 2016-02-29 NOTE — Discharge Summary (Signed)
[name] NAME: [name]    MR#:  [id number]  DATE OF BIRTH:  February 28, 1946  DATE OF ADMISSION:  02/26/2016 ADMITTING PHYSICIAN: Dustin Flock, MD  DATE OF DISCHARGE: No discharge date for patient encounter.  PRIMARY CARE PHYSICIAN: [name], MD     ADMISSION DIAGNOSIS:  Chest pressure [R07.89] Chest pain, unspecified chest pain type [R07.9]  DISCHARGE DIAGNOSIS:  Principal Problem:   Symptomatic bradycardia Active Problems:   Chest pain   First degree AV block   Chest discomfort   Generalized weakness   Thrombocytopenia (HCC)   Sinus pause   Paroxysmal atrial fibrillation (HCC)   PTSD (post-traumatic stress disorder)   Hypokalemia   SECONDARY DIAGNOSIS:   Past Medical History:  Diagnosis Date  . Arthritis   . Arthritis   . Atrial fibrillation ([place])    a. Dx 2013, recurred 02/2014, CHA2DS2VASc = 3 -->placed on Eliquis;  b. 02/2014 Echo: EF 50-55%, mid and apical anterior septum and mid and apical inf septum are abnl, mild to mod Ao sclerosis w/o AS.  Marland Kitchen Chicken pox   . Chronic lymphocytic leukemia ([place])    a. Dx 02/2014.  Marland Kitchen COPD (chronic obstructive pulmonary disease) ([place])   . Coronary artery disease    a. 04/2009 CABG x 3 (LIMA->LAD, VG->OM1, VG->PDA);  b. 09/2009 Cath: occluded VG x 2 w/ patent LIMA and L->R collats. EF 55%, mild antlat HK;  c. 10/2011 MV: EF 53%, no isch/infarct-->low risk.  Marland Kitchen History of arthritis   . Hypertension   . OSA (obstructive sleep apnea)    a. On CPAP  . Pure hypercholesterolemia   . Rheumatic fever     .pro HOSPITAL COURSE:   The patient is 70 year old Caucasian male with past medical history significant for history of atrial fibrillation, coronary artery disease, chronic lymphocytic leukemia, COPD, essential hypertension, obstructive sleep apnea who presents to the hospital with complaints of weakness, chest pressure, dizziness. On arrival to the hospital.  He was noted to have heart rate in 40s. Amiodarone as well as metoprolol was stopped, however, he is a heart rate remains in 40s, with intermittent sinus pauses up to 3 seconds, despite being off amiodarone and metoprolol, he feels very somnolent , heart rate is as low as 29 at nighttime, however, improved to 60s on exertion. Patient feels symptomatic with bradycardia episodes, dizzy, lightheaded, presyncopal. Patient was seen by cardiologist who recommended pacemaker placement due to concerns of tachybradycardia syndrome. Patient will be sent to [facility] cardiology for pacemaker placement. Discussion by problem: #1. Chest pain, negative for cardiac injury by cardiac markers, likely related to bradycardia, The patient was seen by cardiologist, most recent stress test was done in February 2017 and showed no ischemia, echocardiogram was done in April 2017 and revealed normal ejection fraction. Heart rate has improved with physical therapy, the patient had no recurrent chest pains but remains low at nighttime as low as 29 with sinus pauses to 3 seconds. Patient is otherwise symptomatic, complains of dizziness, lightheadedness and feeling very weak. He is going to be sent [facility] pacemaker placement. #2. Sinus bradycardia and first-degree AV block with 3 second pauses, initially thought to be due to medications, now off amiodarone and metoprolol, heart rate improved with physical activity, however, remains low at rest as well as 29, patient is symptomatic, it is a concern of tachybrady syndrome, patient will be sent to [facility] for  pacemaker placement per cardiologist's recommendations. #3. Hypotension, resolved. Holding medications, etiology of hypotension was likely related to some element of dehydration, as patient was on Lasix at home, now stopped , but also due to prazosin for PTSD, follow blood pressure readings closely on prazosin and discontinue prazosin  altogether if blood pressure is labile on this medication despite permanent pacemaker placement.. #4. Hypokalemia, supplemented , resolved #5. Thrombocytopenia, platelet count was 171 in February 2017, however, declined over the past few months, LDH was normal, unlikely hemolysis, etiology is unclear, patient may benefit from oncologist evaluation as outpatient, especially since patient is on Eliquis at home, could be related to his chronic lymphocytic leukemia. #6. Somnolence, initially thought due to Remeron, but now the patient is off Remeron for the past 24 hours, remains some somnolent, weak, suspected generalized weakness due to bradycardia, follow after permanent pacemaker is placed, patient may benefit from physical therapist evaluation prior to discharge from the hospitals. DISCHARGE CONDITIONS:   Stable  CONSULTS OBTAINED:  Treatment Team:  Wende Bushy, MD [name], MD  DRUG ALLERGIES:  No Known Allergies  DISCHARGE MEDICATIONS:   Current Discharge Medication List    START taking these medications   Details  isosorbide mononitrate (IMDUR) 30 MG 24 hr tablet Take 1 tablet (30 mg total) by mouth daily. Qty: 30 tablet, Refills: 5      CONTINUE these medications which have NOT CHANGED   Details  apixaban (ELIQUIS) 5 MG TABS tablet Take 1 tablet (5 mg total) by mouth 2 (two) times daily. Qty: 60 tablet, Refills: 6    atorvastatin (LIPITOR) 40 MG tablet Take 40 mg by mouth at bedtime.    budesonide-formoterol (SYMBICORT) 160-4.5 MCG/ACT inhaler Inhale 2 puffs into the lungs 2 (two) times daily.    Coenzyme Q10 (CO Q-10) 100 MG CAPS Take 100 mg by mouth at bedtime.     Multiple Vitamin (MULTIVITAMIN WITH MINERALS) TABS tablet Take 1 tablet by mouth daily.    nitroGLYCERIN (NITROSTAT) 0.4 MG SL tablet Place 1 tablet (0.4 mg total) under the tongue every 5 (five) minutes as needed for chest pain. Qty: 25 tablet, Refills: 6    prazosin (MINIPRESS) 2 MG capsule Take  2 mg by mouth at bedtime.    tiotropium (SPIRIVA) 18 MCG inhalation capsule Place 18 mcg into inhaler and inhale at bedtime.    traMADol (ULTRAM) 50 MG tablet Take 1 tablet (50 mg total) by mouth every 6 (six) hours as needed for moderate pain. Qty: 90 tablet, Refills: 0      STOP taking these medications     amiodarone (PACERONE) 200 MG tablet      amLODipine (NORVASC) 5 MG tablet      furosemide (LASIX) 20 MG tablet      metolazone (ZAROXOLYN) 5 MG tablet      metoprolol tartrate (LOPRESSOR) 25 MG tablet      mirtazapine (REMERON) 15 MG tablet          DISCHARGE INSTRUCTIONS:    Patient is to follow-up with primary care physician, cardiologist as outpatient  If you experience worsening of your admission symptoms, develop shortness of breath, life threatening emergency, suicidal or homicidal thoughts you must seek medical attention immediately by calling 911 or calling your MD immediately  if symptoms less severe.  You Must read complete instructions/literature along with all the possible adverse reactions/side effects for all the Medicines you take and that have been prescribed to you. Take any new Medicines after you have  completely understood and accept all the possible adverse reactions/side effects.   Please note  You were cared for by a hospitalist during your hospital stay. If you have any questions about your discharge medications or the care you received while you were in the hospital after you are discharged, you can call the unit and asked to speak with the hospitalist on call if the hospitalist that took care of you is not available. Once you are discharged, your primary care physician will handle any further medical issues. Please note that NO REFILLS for any discharge medications will be authorized once you are discharged, as it is imperative that you return to your primary care physician (or establish a relationship with a primary care physician if you do not have  one) for your aftercare needs so that they can reassess your need for medications and monitor your lab values.    Today   CHIEF COMPLAINT:   Chief Complaint  Patient presents with  . Chest Pain    HISTORY OF PRESENT ILLNESS:  Dyshaun Ouderkirk  is a 70 y.o. male with a known history of atrial fibrillation, coronary artery disease, chronic lymphocytic leukemia, COPD, essential hypertension, obstructive sleep apnea who presents to the hospital with complaints of weakness, chest pressure, dizziness. On arrival to the hospital. He was noted to have heart rate in 40s. Amiodarone as well as metoprolol was stopped, however, he is a heart rate remains in 40s, with intermittent sinus pauses up to 3 seconds, despite being off amiodarone and metoprolol, he feels very somnolent , heart rate is as low as 29 at nighttime, however, improved to 60s on exertion. Patient feels symptomatic with bradycardia episodes, dizzy, lightheaded, presyncopal. Patient was seen by cardiologist who recommended pacemaker placement due to concerns of tachybradycardia syndrome. Patient will be sent to [facility] cardiology for pacemaker placement. Discussion by problem: #1. Chest pain, negative for cardiac injury by cardiac markers, likely related to bradycardia, The patient was seen by cardiologist, most recent stress test was done in February 2017 and showed no ischemia, echocardiogram was done in April 2017 and revealed normal ejection fraction. Heart rate has improved with physical therapy, the patient had no recurrent chest pains but remains low at nighttime as low as 29 with sinus pauses to 3 seconds. Patient is otherwise symptomatic, complains of dizziness, lightheadedness and feeling very weak. He is going to be sent [facility] placement. #2. Sinus bradycardia and first-degree AV block with 3 second pauses, initially thought to be due to medications, now off amiodarone and metoprolol,  heart rate improved with physical activity, however, remains low at rest as well as 29, patient is symptomatic, it is a concern of tachybrady syndrome, patient will be sent to [facility] for pacemaker placement per cardiologist's recommendations. #3. Hypotension, resolved. Holding medications, etiology of hypotension was likely related to some element of dehydration, as patient was on Lasix at home, now stopped , but also due to prazosin for PTSD, follow blood pressure readings closely on prazosin and discontinue prazosin altogether if blood pressure is labile on this medication despite permanent pacemaker placement.. #4. Hypokalemia, supplemented , resolved #5. Thrombocytopenia, platelet count was 171 in February 2017, however, declined over the past few months, LDH was normal, unlikely hemolysis, etiology is unclear, patient may benefit from oncologist evaluation as outpatient, especially since patient is on Eliquis at home, could be related to his chronic lymphocytic leukemia. #6. Somnolence, initially thought due to Remeron, but  now the patient is off Remeron for the past 24 hours, remains some somnolent, weak, suspected generalized weakness due to bradycardia, follow after permanent pacemaker is placed, patient may benefit from physical therapist evaluation prior to discharge from the hospitals.   VITAL SIGNS:  Blood pressure 115/62, pulse (!) 48, temperature 98 F (36.7 C), temperature source Oral, resp. rate (!) 22, height 5\' 9"  (1.753 m), weight 110 kg (242 lb 9.6 oz), SpO2 93 %.  I/O:   Intake/Output Summary (Last 24 hours) at 02/29/16 1109 Last data filed at 02/29/16 1031  Gross per 24 hour  Intake              480 ml  Output             3050 ml  Net            -2570 ml    PHYSICAL EXAMINATION:  GENERAL:  70 y.o.-year-old patient lying in the bed with no acute distress.  EYES: Pupils equal, round, reactive to light and accommodation. No scleral icterus. Extraocular muscles  intact.  HEENT: Head atraumatic, normocephalic. Oropharynx and nasopharynx clear.  NECK:  Supple, no jugular venous distention. No thyroid enlargement, no tenderness.  LUNGS: Normal breath sounds bilaterally, no wheezing, rales,rhonchi or crepitation. No use of accessory muscles of respiration.  CARDIOVASCULAR: S1, S2 , bradycardic. No murmurs, rubs, or gallops.  ABDOMEN: Soft, non-tender, non-distended. Bowel sounds present. No organomegaly or mass.  EXTREMITIES: No pedal edema, cyanosis, or clubbing.  NEUROLOGIC: Cranial nerves II through XII are intact. Muscle strength 5/5 in all extremities. Sensation intact. Gait not checked.  PSYCHIATRIC: The patient is alert and oriented x 3.  SKIN: No obvious rash, lesion, or ulcer.   DATA REVIEW:   CBC  Recent Labs Lab 02/29/16 0807  WBC 6.9  HGB 12.6*  HCT 35.6*  PLT 110*    Chemistries   Recent Labs Lab 02/26/16 1058 02/26/16 1804 02/27/16 0133 02/29/16 0807  NA 139  --  142  --   K 3.7  --  3.4* 3.9  CL 108  --  109  --   CO2 27  --  28  --   GLUCOSE 131*  --  97  --   BUN 14  --  13  --   CREATININE 0.91  --  0.99  --   CALCIUM 8.8*  --  8.3*  --   MG  --  2.0  --   --   AST 23  --   --   --   ALT 37  --   --   --   ALKPHOS 79  --   --   --   BILITOT 1.6*  --   --   --     Cardiac Enzymes  Recent Labs Lab 02/27/16 0133  TROPONINI <0.03    Microbiology Results  Results for orders placed or performed in visit on 01/16/12  Fecal occult blood, imunochemical     Status: None   Collection Time: 01/16/12  1:29 PM  Result Value Ref Range Status   Fecal Occult Bld Negative Negative Final    RADIOLOGY:  No results found.  EKG:   Orders placed or performed during the hospital encounter of 02/26/16  . EKG 12-Lead  . EKG 12-Lead      Management plans discussed with the patient, family and they are in agreement.  CODE STATUS:     Code Status Orders  Start     Ordered   02/26/16 1243  Full code   Continuous     02/26/16 1243    Code Status History    Date Active Date Inactive Code Status Order ID Comments User Context   11/02/2015  2:34 PM 11/03/2015  5:34 PM Full Code [id number]  Gladstone Lighter, MD ED   08/22/2015  7:43 PM 08/23/2015  6:32 PM Full Code [id number]  Nicholes Mango, MD Inpatient      TOTAL TIME TAKING CARE OF THIS PATIENT: 40 minutes.  Discussed with cardiology  Umaima Scholten M.D on 02/29/2016 at 11:09 AM  Between 7am to 6pm - Pager - [phone]  After 6pm go to [email or web address] - password [email or web address] [facility]  Office  [phone]  CC: Primary care physician; [name], MD

## 2016-02-29 NOTE — Progress Notes (Signed)
ANTICOAGULATION CONSULT NOTE - Initial Consult  Pharmacy Consult for Heparin Indication: atrial fibrillation  No Known Allergies  Patient Measurements: Height: 5\' 9"  (175.3 cm) Weight: 229 lb 3.2 oz (104 kg) IBW/kg (Calculated) : 70.7 Heparin Dosing Weight: 93 kg  Vital Signs: Temp: 98 F (36.7 C) (08/11 1224) Temp Source: Oral (08/11 1224) BP: 115/62 (08/11 1105) Pulse Rate: 48 (08/11 1105)  Labs:  Recent Labs  02/26/16 1608 02/27/16 0133 02/29/16 0807  HGB  --  11.5* 12.6*  HCT  --  32.5* 35.6*  PLT  --  112* 110*  CREATININE  --  0.99  --   TROPONINI <0.03 <0.03  --     Estimated Creatinine Clearance: 82.5 mL/min (by C-G formula based on SCr of 0.99 mg/dL).   Medical History: Past Medical History:  Diagnosis Date  . Arthritis   . Arthritis   . Atrial fibrillation ([place])    a. Dx 2013, recurred 02/2014, CHA2DS2VASc = 3 -->placed on Eliquis;  b. 02/2014 Echo: EF 50-55%, mid and apical anterior septum and mid and apical inf septum are abnl, mild to mod Ao sclerosis w/o AS.  Marland Kitchen Chicken pox   . Chronic lymphocytic leukemia ([place])    a. Dx 02/2014.  Marland Kitchen COPD (chronic obstructive pulmonary disease) ([place])   . Coronary artery disease    a. 04/2009 CABG x 3 (LIMA->LAD, VG->OM1, VG->PDA);  b. 09/2009 Cath: occluded VG x 2 w/ patent LIMA and L->R collats. EF 55%, mild antlat HK;  c. 10/2011 MV: EF 53%, no isch/infarct-->low risk.  Marland Kitchen History of arthritis   . Hypertension   . OSA (obstructive sleep apnea)    a. On CPAP  . Pure hypercholesterolemia   . Rheumatic fever     Medications:  Scheduled:  . apixaban  5 mg Oral BID  . atorvastatin  40 mg Oral q1800  . [START ON 03/01/2016] fluticasone furoate-vilanterol  1 puff Inhalation Daily  . [START ON 03/01/2016] isosorbide mononitrate  30 mg Oral Daily  . [START ON 03/01/2016] multivitamin with minerals  1 tablet Oral Daily  . prazosin  2 mg Oral QHS  . sodium chloride flush  3 mL Intravenous Q12H  . tiotropium  18 mcg  Inhalation QHS   Infusions:    Assessment: 70 yo M who presented to [facility] 8/8 with chest pain and weakness.  They have adjusted medications and managed his HTN but noted several episodes of bradycardia.  Pt transferred to [facility] for pacemaker placement.  Eliquis PTA and continued at [facility].  Plan to give Eliquis dose 8/11 PM and start heparin infusion 8/12 AM in anticipation of PPM placement.  Goal of Therapy:  Heparin level 0.3-0.7 units/ml aPTT 66-102 seconds Monitor platelets by anticoagulation protocol: Yes   Plan:  Baseline heparin level and aPTT ordered with AM labs 8/12 Last Eliquis dose to be given 8/11 at 10pm Start heparin 8/12 at 10am - HDW 93 kg Heparin at 1400 units/hr - start 8/12 at 1000 Heparin level and aPTT at [place], Pharm.D., BCPS Clinical Pharmacist Pager [phone] 02/29/2016 2:03 PM

## 2016-02-29 NOTE — Progress Notes (Signed)
Patient planning to transfer to [facility] for pacemaker placement.  Alert and oriented, vss, no complaints of pain.  SB on monitor.  Ambulated in hallway with no symptoms.

## 2016-02-29 NOTE — H&P (Signed)
History and Physical  Patient ID: [name] MRN: [id number], DOB: 06-20-1946 Admit Date: (Not on file) Date of Encounter: 02/29/2016, 11:14 AM Primary Physician: [name], MD Primary Cardiologist: Dr. Rockey Situ, MD  Chief Complaint: Weakness Reason for Admission: CP/symptomatic bradycardia  HPI: 70 y.o. male with h/o CAD s/p 3-vessel CABG in 04/2009 with LIMA to LAD, SVG to OM1, and SVG to PDA, PAF in 09/2014 covnerting to sinus rhythm with metoprolol on Eliquis and amiodarone with documented known episdoes of Afib in 2013, 2015, and tachycardia concerning for Afib with RVR in 04/2015, atrial flutter diagnosed on 09/12/2015 on amiodarone only, chronic diastolic CHF/venous stasis, COPD 2/2 prior tobacco abuse, CLL, LE edema, PTSD, HTN, OSA on CPAP, and HLD who presented to [facility] on 8/8 with complaints of chest pain and weakness.   Cardiac cath from 09/2009 showed occluded VG x 2 with patent LIMA to LAD. There were collaterals from left-to-right. EF 55% wth mild anterolateral hypokinesis. Last echo from 11/02/2015 showed and EF of 55-60%, no RWMA, LV diastolic function was normal, left atrium was mildly dilated, RV systolic function was normal, PASP normal. Prior echo from date uncertain showed and EF of 35-45% with mildly RVSP. The only studies on file for review indicate an EF of 55% dating back to 02/2014.  At his office visit with Dr. Rockey Situ on 01/11/2016 he noted increased LE edema. It was felt this may be 2/2 amlodipine and this was held. He was also advised to take metolazone as needed in the morning with Lasix 80 mg. The patient called back on 6/30 noting his LE edema had resolved. In follow up on 7/27 he was doing well. No further LE edema. He had not taken either Lasix or metolazone in the prior 3 weeks leading up to his visit on 7/27. He was noted to be bradycardic with a heart rate of 51 bpm. He refused any medication changes at that time unless Dr. Rockey Situ made them.  He also  stated, "I'll eat damn thing I want to."   He was admitted on 8/8 with substernal chest pain that was intermittent and had been present for several hours prior to presentation to the hospital. He also had associated eleavted BP with pressures in the [id number] systolic. His heart rate was noted to be in the 50's upon admission. He complained of feeling dizzy, lightheaded, and weak. Labs upon admission showed a K+ of 3.7-->3.4-->3.9. HGB 13.1, TSH normal, BNP 248, troponin negative x 3. He was noted to have symptomatic bradycardia with 1st degree AV block with pauses less than 3 seconds. His metoprolol and amiodarone were held upon admission with his last dose of both being 02/25/16. His weakness was felt to possibly be 2/2 his PTSD medications vs his bradycardia. Despite holding the above medications he continued to have bradycardic heart rates into the low 30's overnight into 8/10 with pauses close to 3 seconds. 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   Past Medical History:  Diagnosis Date  . Arthritis   . Arthritis   . Atrial fibrillation ([place])    a. Dx 2013, recurred 02/2014, CHA2DS2VASc = 3 -->placed on Eliquis;  b. 02/2014 Echo: EF 50-55%, mid and apical anterior septum and mid and apical inf septum are abnl, mild to mod Ao sclerosis w/o AS.  Marland Kitchen Chicken pox   . Chronic lymphocytic leukemia ([place])    a. Dx 02/2014.  Marland Kitchen COPD (chronic obstructive  pulmonary disease) ([place])   . Coronary artery disease    a. 04/2009 CABG x 3 (LIMA->LAD, VG->OM1, VG->PDA);  b. 09/2009 Cath: occluded VG x 2 w/ patent LIMA and L->R collats. EF 55%, mild antlat HK;  c. 10/2011 MV: EF 53%, no isch/infarct-->low risk.  Marland Kitchen History of arthritis   . Hypertension   . OSA (obstructive sleep apnea)    a. On CPAP  . Pure hypercholesterolemia   . Rheumatic fever      Most Recent Cardiac Studies: As above   Surgical History:  Past Surgical History:  Procedure Laterality  Date  . APPENDECTOMY  1988  . CARDIAC CATHETERIZATION  2011   Dr Fletcher Anon  . CHOLECYSTECTOMY    . CORONARY ARTERY BYPASS GRAFT  2010   3v  . HERNIA REPAIR     abdominal  . HERNIA REPAIR    . TONSILLECTOMY AND ADENOIDECTOMY  1956     Home Meds: Prior to Admission medications   Medication Sig Start Date End Date Taking? Authorizing Provider  amiodarone (PACERONE) 200 MG tablet Take 0.5 tablets (100 mg total) by mouth 2 (two) times daily. Patient taking differently: Take 100 mg by mouth 2 (two) times daily as needed.  10/24/15   [name], MD  amLODipine (NORVASC) 5 MG tablet Take 5 mg by mouth daily.    Historical Provider, MD  apixaban (ELIQUIS) 5 MG TABS tablet Take 1 tablet (5 mg total) by mouth 2 (two) times daily. 02/12/15   [name], MD  atorvastatin (LIPITOR) 40 MG tablet Take 40 mg by mouth at bedtime.    Historical Provider, MD  budesonide-formoterol (SYMBICORT) 160-4.5 MCG/ACT inhaler Inhale 2 puffs into the lungs 2 (two) times daily.    Historical Provider, MD  Coenzyme Q10 (CO Q-10) 100 MG CAPS Take 100 mg by mouth at bedtime.     Historical Provider, MD  furosemide (LASIX) 20 MG tablet Take 20 mg by mouth 2 (two) times daily as needed for edema.     Historical Provider, MD  isosorbide mononitrate (IMDUR) 30 MG 24 hr tablet Take 1 tablet (30 mg total) by mouth daily. 02/29/16   Theodoro Grist, MD  metolazone (ZAROXOLYN) 5 MG tablet Take 1 tablet (5 mg total) by mouth daily as needed. 01/11/16   [name], MD  metoprolol tartrate (LOPRESSOR) 25 MG tablet Take 25 mg by mouth 2 (two) times daily.     Historical Provider, MD  mirtazapine (REMERON) 15 MG tablet Take 1 tablet (15 mg total) by mouth at bedtime. 11/09/15   [name], MD  Multiple Vitamin (MULTIVITAMIN WITH MINERALS) TABS tablet Take 1 tablet by mouth daily.    Historical Provider, MD  nitroGLYCERIN (NITROSTAT) 0.4 MG SL tablet Place 1 tablet (0.4 mg total) under the tongue every 5 (five) minutes as  needed for chest pain. 11/03/15   [name], MD  prazosin (MINIPRESS) 2 MG capsule Take 2 mg by mouth at bedtime.    Historical Provider, MD  tiotropium (SPIRIVA) 18 MCG inhalation capsule Place 18 mcg into inhaler and inhale at bedtime.    Historical Provider, MD  traMADol (ULTRAM) 50 MG tablet Take 1 tablet (50 mg total) by mouth every 6 (six) hours as needed for moderate pain. 01/28/16   Cammie Sickle, MD    Allergies: No Known Allergies  Social History   Social History  . Marital status: Married    Spouse name: N/A  . Number of children: N/A  .  Years of education: N/A   Occupational History  . retired [employer]   Social History Main Topics  . Smoking status: Former Smoker    Packs/day: 1.00    Years: 40.00    Types: Cigarettes    Quit date: 07/21/2006  . Smokeless tobacco: Never Used  . Alcohol use No     Comment: occasionally  . Drug use: No  . Sexual activity: No   Other Topics Concern  . Not on file   Social History Narrative   Lives in [place] with wife. Dogs. Goats. Children - 4. Grandchildren - 7.      Worked - Sports coach, part time.   Diet - healthy   Exercise - very active      Service - [employer] - [place]     Family History  Problem Relation Age of Onset  . Heart disease Mother   . Coronary artery disease      family history    Review of Systems: Review of Systems  Constitutional: Positive for malaise/fatigue. Negative for chills, diaphoresis, fever and weight loss.  HENT: Negative for congestion.   Eyes: Negative for discharge and redness.  Respiratory: Positive for shortness of breath. Negative for cough, hemoptysis, sputum production and wheezing.   Cardiovascular: Positive for chest pain. Negative for palpitations, orthopnea, claudication, leg swelling and PND.  Gastrointestinal: Negative for abdominal pain, blood in stool, heartburn, melena, nausea and vomiting.  Genitourinary: Negative  for hematuria.  Musculoskeletal: Negative for falls and myalgias.  Skin: Negative for rash.  Neurological: Positive for dizziness and weakness. Negative for tingling, tremors, sensory change, speech change, focal weakness and loss of consciousness.  Endo/Heme/Allergies: Does not bruise/bleed easily.  Psychiatric/Behavioral: Negative for substance abuse. The patient is not nervous/anxious.     Labs:   Lab Results  Component Value Date   WBC 6.9 02/29/2016   HGB 12.6 (L) 02/29/2016   HCT 35.6 (L) 02/29/2016   MCV 94.1 02/29/2016   PLT 110 (L) 02/29/2016    Recent Labs Lab 02/26/16 1058 02/27/16 0133 02/29/16 0807  NA 139 142  --   K 3.7 3.4* 3.9  CL 108 109  --   CO2 27 28  --   BUN 14 13  --   CREATININE 0.91 0.99  --   CALCIUM 8.8* 8.3*  --   PROT 7.0  --   --   BILITOT 1.6*  --   --   ALKPHOS 79  --   --   ALT 37  --   --   AST 23  --   --   GLUCOSE 131* 97  --     Recent Labs  02/26/16 1608 02/27/16 0133  TROPONINI <0.03 <0.03   Lab Results  Component Value Date   CHOL 142 08/23/2015   HDL 33 (L) 08/23/2015   LDLCALC 76 08/23/2015   TRIG 167 (H) 08/23/2015   No results found for: DDIMER  Radiology/Studies:  Dg Chest Port 1 View  Result Date: 02/26/2016 CLINICAL DATA:  70 year old male with a history of left-sided chest pressure EXAM: PORTABLE CHEST 1 VIEW COMPARISON:  09/14/2015 FINDINGS: Cardiomediastinal silhouette unchanged. 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 Defibrillator pads project over the chest. Linear opacity at the left base is unchanged from comparison. No pneumothorax. No confluent airspace disease. No large pleural effusion. No displaced fractures identified. IMPRESSION: Chronic lung changes with basilar atelectasis/ scarring, and no radiographic evidence of superimposed acute cardiopulmonary  disease. Surgical changes of median sternotomy and CABG. Signed, Dulcy Fanny. Earleen Newport, DO [facility]  [facility] Radiology Electronically Signed   By: Corrie Mckusick D.O.   On: 02/26/2016 12:01     EKG: Interpreted by me showed: EKG from 02/26/2016 was personally reviewed by me and showed sinus bradycardia 41 BPM, first-degree AV block, nonspecific ST changes Telemetry: Interpreted by me showed: sinus bradycardia, 30's to 50's bpm with 1st degree AV block  Weights: 242 pounds on 8/8   Physical Exam: 02/29/16 0415      BP:   (!) 166/78 (!) 120/50  Pulse: (!) 54 61 (!) 51 (!) 45  Resp:   18 20  Temp:   98.5 F (36.9 C) 98 F (36.7 C)  TempSrc:   Oral Oral  SpO2:   96% 93%   General: Well developed, well nourished, in no acute distress. Head: Normocephalic, atraumatic, sclera non-icteric, no xanthomas, nares are without discharge.  Neck: Negative for carotid bruits. JVD not elevated. Lungs: Clear bilaterally to auscultation without wheezes, rales, or rhonchi. Breathing is unlabored. Heart: Bradycardic with S1 S2. II/VI SEM in the aortic area, no rubs, or gallops appreciated. Abdomen: Soft, non-tender, non-distended with normoactive bowel sounds. No hepatomegaly. No rebound/guarding. No obvious abdominal masses. Msk:  Strength and tone appear normal for age. Extremities: No clubbing or cyanosis. No edema. Distal pedal pulses are 2+ and equal bilaterally. Neuro: Alert and oriented X 3. No focal deficit. No facial asymmetry. Moves all extremities spontaneously. Psych:  Responds to questions appropriately with a normal affect.    ASSESSMENT AND PLAN:  Principal Problem:   Symptomatic bradycardia Active Problems:   Paroxysmal atrial fibrillation (HCC)   PTSD (post-traumatic stress disorder)   Chest pain   First degree AV block   1. Symptomatic sinus bradycardia with first-degree AV block and recurrent pauses: Both amiodarone and metoprolol has been held since admission. However, the patient continues to have episodes of severe sinus bradycardia with pauses close to 3  seconds. It would take weeks for amiodarone to get out of his system. 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 This was discussed this with the patient and his wife. Dr. Fletcher Anon, MD recommend transfer to [facility] for a pacemaker placement on Monday. The patient is currently on anticoagulation with Eliquis which will be held starting tomorrow and he will be started on heparin gtt on 8/12  2. Weakness:   Likely due to symptomatic sinus bradycardia and sinus node dysfunction.  3. History of atrial flutter/fibrillation:   He is currently in sinus rhythm but high risk for recurrent episodes off amiodarone. Amiodarone should be resumed after pacemaker placement. Eliquis for this evening and starting heparin gtt on 8/12. CHADS2VASc at least 4 (CHF, HTN, age x 1, vascular disease)  4. History of coronary artery disease status post CABG:  No clear symptoms of angina. Continue medical therapy.   5. Essential hypertension:  Blood pressure is reasonably controlled with some fluctuation. Avoid calcium tablet block due to significant leg edema in the past. If the blood pressure medication is needed, a small dose ACE inhibitor or ARB can be added  Signed, Christell Faith, PA-C [place] Pager: [phone] 02/29/2016, 11:14 AM

## 2016-03-01 DIAGNOSIS — R5383 Other fatigue: Secondary | ICD-10-CM

## 2016-03-01 DIAGNOSIS — F4310 Post-traumatic stress disorder, unspecified: Secondary | ICD-10-CM

## 2016-03-01 DIAGNOSIS — I480 Paroxysmal atrial fibrillation: Secondary | ICD-10-CM

## 2016-03-01 DIAGNOSIS — I495 Sick sinus syndrome: Principal | ICD-10-CM

## 2016-03-01 DIAGNOSIS — R5382 Chronic fatigue, unspecified: Secondary | ICD-10-CM

## 2016-03-01 LAB — CBC WITH DIFFERENTIAL/PLATELET
Basophils Absolute: 0.1 10*3/uL (ref 0.0–0.1)
Basophils Relative: 1 %
EOS PCT: 5 %
Eosinophils Absolute: 0.3 10*3/uL (ref 0.0–0.7)
HCT: 39.2 % (ref 39.0–52.0)
Hemoglobin: 12.8 g/dL — ABNORMAL LOW (ref 13.0–17.0)
LYMPHS ABS: 1.5 10*3/uL (ref 0.7–4.0)
LYMPHS PCT: 22 %
MCH: 31.5 pg (ref 26.0–34.0)
MCHC: 32.7 g/dL (ref 30.0–36.0)
MCV: 96.6 fL (ref 78.0–100.0)
MONO ABS: 0.7 10*3/uL (ref 0.1–1.0)
Monocytes Relative: 10 %
Neutro Abs: 4.1 10*3/uL (ref 1.7–7.7)
Neutrophils Relative %: 62 %
Platelets: 138 10*3/uL — ABNORMAL LOW (ref 150–400)
RBC: 4.06 MIL/uL — AB (ref 4.22–5.81)
RDW: 14.4 % (ref 11.5–15.5)
WBC: 6.6 10*3/uL (ref 4.0–10.5)

## 2016-03-01 LAB — COMPREHENSIVE METABOLIC PANEL
ALBUMIN: 3.6 g/dL (ref 3.5–5.0)
ALT: 26 U/L (ref 17–63)
AST: 18 U/L (ref 15–41)
Alkaline Phosphatase: 75 U/L (ref 38–126)
Anion gap: 11 (ref 5–15)
BUN: 13 mg/dL (ref 6–20)
CHLORIDE: 103 mmol/L (ref 101–111)
CO2: 25 mmol/L (ref 22–32)
CREATININE: 1.03 mg/dL (ref 0.61–1.24)
Calcium: 8.9 mg/dL (ref 8.9–10.3)
GFR calc Af Amer: >60 mL/min (ref >60)
GFR calc non Af Amer: >60 mL/min (ref >60)
GLUCOSE: 122 mg/dL — AB (ref 65–99)
Potassium: 4.2 mmol/L (ref 3.5–5.1)
SODIUM: 139 mmol/L (ref 135–145)
Total Bilirubin: 1.5 mg/dL — ABNORMAL HIGH (ref 0.3–1.2)
Total Protein: 6.1 g/dL — ABNORMAL LOW (ref 6.5–8.1)

## 2016-03-01 LAB — MAGNESIUM: Magnesium: 2.2 mg/dL (ref 1.7–2.4)

## 2016-03-01 LAB — PROTIME-INR
INR: 1.28
PROTHROMBIN TIME: 16.1 s — AB (ref 11.4–15.2)

## 2016-03-01 LAB — APTT
aPTT: 35 seconds (ref 24–36)
aPTT: 64 seconds — ABNORMAL HIGH (ref 24–36)

## 2016-03-01 LAB — HEPARIN LEVEL (UNFRACTIONATED): HEPARIN UNFRACTIONATED: 1.96 [IU]/mL — AB (ref 0.30–0.70)

## 2016-03-01 NOTE — Consult Note (Signed)
Initial Neurological Consultation                      NEURO HOSPITALIST CONSULT NOTE   Requestig physician: Dr. Gwenlyn Found   Reason for Consult:  General sense of fatigue.   HPI:                                                                                                                                          [name] is an 70 y.o. male who presents for placement of a pacemaker. He has a known history of coronary artery disease and is status post bypass grafting, congestive heart failure with venous stasis, COPD, chronic lymphocytic leukemia, hypertension, sleep apnea, hyperlipidemia and reduced ejection fraction of 35-45% area. He reports he has been on the Lipitor for many years. He reports he was started on amiodarone a few months ago. He notes the dose has been recently changed. He reports using his CPAP at night. He is describing no cranial nerve symptoms. He reports no focal neurological complaints.    Past Medical History:  Diagnosis Date  . Arthritis    "qwhere" (02/29/2016)  . Atrial fibrillation ([place])    a. Dx 2013, recurred 02/2014, CHA2DS2VASc = 3 -->placed on Eliquis;  b. 02/2014 Echo: EF 50-55%, mid and apical anterior septum and mid and apical inf septum are abnl, mild to mod Ao sclerosis w/o AS.  Marland Kitchen Chicken pox   . Chronic lymphocytic leukemia ([place])    a. Dx 02/2014.  Marland Kitchen COPD (chronic obstructive pulmonary disease) ([place])   . Coronary artery disease    a. 04/2009 CABG x 3 (LIMA->LAD, VG->OM1, VG->PDA);  b. 09/2009 Cath: occluded VG x 2 w/ patent LIMA and L->R collats. EF 55%, mild antlat HK;  c. 10/2011 MV: EF 53%, no isch/infarct-->low risk.  Marland Kitchen Heart murmur    "since I was 70 years old"  . Hypertension   . Myocardial infarction ([place]) 2010  . OSA on CPAP   . PTSD (post-traumatic stress disorder)   . Pure hypercholesterolemia   . Rheumatic fever 1959    Past Surgical History:  Procedure Laterality Date  . ABDOMINAL HERNIA REPAIR    . APPENDECTOMY  06/21/1985   . CARDIAC CATHETERIZATION  2010; 2011   ; Dr Fletcher Anon  . CORONARY ARTERY BYPASS GRAFT  04/2009   "CABG X3"  . [place]   "shrapnel in my tailbone"  . HERNIA REPAIR    . INGUINAL HERNIA REPAIR Right   . LAPAROSCOPIC CHOLECYSTECTOMY    . TONSILLECTOMY AND ADENOIDECTOMY  1956    MEDICATIONS:  I have reviewed the patient's current medications.  No Known Allergies   Social History:  reports that he quit smoking about 9 years ago. His smoking use included Cigarettes. He has a 40.00 pack-year smoking history. He has never used smokeless tobacco. He reports that he does not drink alcohol or use drugs.  Family History  Problem Relation Age of Onset  . Heart disease Mother   . Coronary artery disease      family history     ROS:                                                                                                                                       History obtained from chart review  General ROS: negative for - chills, fatigue, fever, night sweats, weight gain or weight loss Psychological ROS: negative for - behavioral disorder, hallucinations, memory difficulties, mood swings or suicidal ideation Ophthalmic ROS: negative for - blurry vision, double vision, eye pain or loss of vision ENT ROS: negative for - epistaxis, nasal discharge, oral lesions, sore throat, tinnitus or vertigo Allergy and Immunology ROS: negative for - hives or itchy/watery eyes Hematological and Lymphatic ROS: negative for - bleeding problems, bruising or swollen lymph nodes Endocrine ROS: negative for - galactorrhea, hair pattern changes, polydipsia/polyuria or temperature intolerance Respiratory ROS: negative for - cough, hemoptysis, shortness of breath or wheezing Cardiovascular ROS: negative for - chest pain, dyspnea on exertion, edema or irregular  heartbeat Gastrointestinal ROS: negative for - abdominal pain, diarrhea, hematemesis, nausea/vomiting or stool incontinence Genito-Urinary ROS: negative for - dysuria, hematuria, incontinence or urinary frequency/urgency Musculoskeletal ROS: negative for - joint swelling or muscular weakness Neurological ROS: as noted in HPI Dermatological ROS: negative for rash and skin lesion changes   General Exam                                                                                                      Blood pressure 135/65, pulse 65, temperature 98.8 F (37.1 C), temperature source Oral, resp. rate 19, height 5\' 9"  (1.753 m), weight 104 kg (229 lb 3.2 oz), SpO2 94 %. HEENT-  Normocephalic, no lesions, without obvious abnormality.  Normal external eye and conjunctiva.  Normal TM's bilaterally.  Normal auditory canals and external ears. Normal external nose, mucus membranes and septum.  Normal pharynx. Cardiovascular- regular rate and rhythm, S1, S2 normal, no murmur, click, rub or gallop, pulses palpable throughout   Lungs- chest clear, some shortness of breath Abdomen- soft, non-tender;  bowel sounds normal; no masses,  no organomegaly Extremities- mild lower extremity edema.    Neurological Examination Mental Status: Alert, oriented, thought content appropriate.  Speech fluent without evidence of aphasia.  Able to follow 3 step commands without difficulty. Cranial Nerves: II: Discs flat bilaterally; Visual fields grossly normal, pupils equal, round, reactive to light and accommodation III,IV, VI: ptosis not present, extra-ocular motions intact bilaterally V,VII: smile symmetric, facial light touch sensation normal bilaterally VIII: hearing normal bilaterally IX,X: uvula rises symmetrically XI: bilateral shoulder shrug XII: midline tongue extension Motor: Right : Upper extremity   5/5    Left:     Upper extremity   5/5  Lower extremity   5/5     Lower extremity   5/5 Tone and  bulk:normal tone throughout; no atrophy noted Sensory: Pinprick and light touch intact throughout, bilaterally Deep Tendon Reflexes: 2+ and symmetric throughout Plantars: Right: downgoing   Left: downgoing Cerebellar: normal finger-to-nose, normal rapid alternating movements and normal heel-to-shin test Gait: normal gait and station      Lab Results: Basic Metabolic Panel:  Recent Labs Lab 02/26/16 1058 02/26/16 1804 02/27/16 0133 02/29/16 0807 03/01/16 0538  NA 139  --  142  --  139  K 3.7  --  3.4* 3.9 4.2  CL 108  --  109  --  103  CO2 27  --  28  --  25  GLUCOSE 131*  --  97  --  122*  BUN 14  --  13  --  13  CREATININE 0.91  --  0.99  --  1.03  CALCIUM 8.8*  --  8.3*  --  8.9  MG  --  2.0  --   --  2.2    Liver Function Tests:  Recent Labs Lab 02/26/16 1058 03/01/16 0538  AST 23 18  ALT 37 26  ALKPHOS 79 75  BILITOT 1.6* 1.5*  PROT 7.0 6.1*  ALBUMIN 4.3 3.6   No results for input(s): LIPASE, AMYLASE in the last 168 hours. No results for input(s): AMMONIA in the last 168 hours.  CBC:  Recent Labs Lab 02/26/16 1058 02/27/16 0133 02/29/16 0807 03/01/16 0538  WBC 6.5 7.4 6.9 6.6  NEUTROABS  --   --   --  4.1  HGB 13.1 11.5* 12.6* 12.8*  HCT 37.8* 32.5* 35.6* 39.2  MCV 94.0 93.9 94.1 96.6  PLT 115* 112* 110* 138*    Cardiac Enzymes:  Recent Labs Lab 02/26/16 1058 02/26/16 1608 02/27/16 0133  TROPONINI <0.03 <0.03 <0.03    Lipid Panel: No results for input(s): CHOL, TRIG, HDL, CHOLHDL, VLDL, LDLCALC in the last 168 hours.  CBG: No results for input(s): GLUCAP in the last 168 hours.  Microbiology: Results for orders placed or performed in visit on 01/16/12  Fecal occult blood, imunochemical     Status: None   Collection Time: 01/16/12  1:29 PM  Result Value Ref Range Status   Fecal Occult Bld Negative Negative Final    Coagulation Studies:  Recent Labs  03/01/16 0538  LABPROT 16.1*  INR 1.28    Imaging: No results  found.  Assessment/Plan:  Jahmai is a pleasant 70 year old gentleman with a known history of significant cardiac issues including congestive heart failure with reduced ejection fraction. He was admitted to the hospital for pacemaker placement. He reports that for the past 2-3 weeks he has felt fatigued with a lack of energy. His neurological exam is normal. He does appear to have some  mild shortness of breath. He has a known history of COPD.  The differential diagnosis of fatigue is fairly broad and includes a number of considerations. Amiodarone can be associated with a general sense of fatigue. The thyroid levels have already been checked and were unremarkable. It would also be reasonable to check B12, B6, and a CK given that he is also on Lipitor. There is no evidence of an ischemic event based on his history or exam.  Consideration might also be given to a repeat sleep study with CPAP titration to ensure that his current level is appropriately controlling his sleep apnea.  Given the known history of chronic lymphocytic leukemia, follow-up with the oncology team and perhaps additional testing for it any evidence of recurrence or spread may be indicated as well  Plan:  1. Suggest labs: B6, B12, CK.  2. Consider trial of discontinuation of amiodarone if possible.  3. Consider repeat sleep study with CPAP titration to determine if the present CPAP level is appropriate.  4. Follow-up with oncology to see if additional testing for progression of the chronic lymphocytic leukemia would be of benefit.  5. We'll sign off for now. Please reconsult if there are any new neurological issues.   Thank you for consulting the neurology service to assist in the care of your patient!      Ayana Imhof A. Tasia Catchings, M.D. Neurohospitalist Phone: [phone]  03/01/2016, 12:33 PM

## 2016-03-01 NOTE — Consult Note (Signed)
ELECTROPHYSIOLOGY CONSULT NOTE  Patient ID: [name], MRN: [id number], DOB/AGE: 1946/07/14 69 y.o. Admit date: 02/29/2016 Date of Consult: 03/01/2016  Primary Physician: [name], MD Primary Cardiologist: TG  Chief Complaint: sinus nodee dysfunction    HPI [name] is a 70 y.o. male  with h/o CAD s/p 3-vessel CABG in 04/2009 with LIMA to LAD, SVG to OM1, and SVG to PDA, PAF in 09/2014 converting to sinus rhythm with metoprolol, then  on Eliquis and amiodarone.  Recurrent with documented known episdoes of Afib in 2013, 2015, and tachycardia concerning for Afib with RVR in 04/2015, atrial flutter diagnosed on 09/12/2015 on amiodarone only, chronic diastolic CHF/venous stasis, COPD 2/2 prior tobacco abuse, CLL, LE edema, PTSD, HTN, OSA on CPAP, and HLD who presented to [facility] on 8/8 with complaints of chest pain and weakness.   Cardiac cath from 09/2009 showed occluded VG x 2 with patent LIMA to LAD. There were collaterals from left-to-right. EF 55% wth mild anterolateral hypokinesis. Last echo from 11/02/2015 showed and EF of 55-60%, no RWMA, LV diastolic function was normal, left atrium was mildly dilated, RV systolic function was normal, PASP normal. Prior echo from date uncertain showed and EF of 35-45% with mildly RVSP. The only studies on file for review indicate an EF of 55% dating back to 02/2014.    He was admitted on 8/8 with substernal chest pain that was intermittent and had been present for several hours prior to presentation to the hospital. His only complaint was significant fatigue that was abrupt in onset and possible rehabilitation as well as dizziness that was non-positional, i.e. it was worse with sitting and with lying down as well as with standing suggesting association with changes in position He also had associated eleavted BP with pressures in the [id number] systolic noted at home.  His heart rate was noted to be in the 50's upon admission. He complained of  feeling dizzy, lightheaded, and weak. Labs upon admission showed a K+ of 3.7-->3.4-->3.9. HGB 13.1, TSH normal, BNP 248, troponin negative x 3.  Sinus pauses were noted. Sinus bradycardia was noted. Junctional escape rates.  He tells me that his baseline blood pressure is in the 100-110 range and that his typical pulses in the 50-60 range. Today, his vital signs approximate this and he has persistent symptoms. His fatigue has been unrelenting.  It is also been noted that he was started on a new drug for PTSD about 3-4 weeks ago. This was stopped at [facility]. Because of his sinus bradycardia, amiodarone was discontinued.  He has been on CPAP for about 3 months. According to the patient the card has been read.   Myoview 2/17 demonstrated ejection fraction of 50% and a prior old MI.    Bypass surgery was initially undertaken in 2010 after he presented with dyspnea on exertion. He does not recall prior chest pain     Past Medical History:  Diagnosis Date  . Arthritis    "qwhere" (02/29/2016)  . Atrial fibrillation ([place])    a. Dx 2013, recurred 02/2014, CHA2DS2VASc = 3 -->placed on Eliquis;  b. 02/2014 Echo: EF 50-55%, mid and apical anterior septum and mid and apical inf septum are abnl, mild to mod Ao sclerosis w/o AS.  Marland Kitchen Chicken pox   . Chronic lymphocytic leukemia ([place])    a. Dx 02/2014.  Marland Kitchen COPD (chronic obstructive pulmonary disease) ([place])   . Coronary artery disease    a. 04/2009 CABG x 3 (LIMA->LAD, VG->OM1,  VG->PDA);  b. 09/2009 Cath: occluded VG x 2 w/ patent LIMA and L->R collats. EF 55%, mild antlat HK;  c. 10/2011 MV: EF 53%, no isch/infarct-->low risk.  Marland Kitchen Heart murmur    "since I was 70 years old"  . Hypertension   . Myocardial infarction ([place]) 2010  . OSA on CPAP   . PTSD (post-traumatic stress disorder)   . Pure hypercholesterolemia   . Rheumatic fever 1959      Surgical History:  Past Surgical History:  Procedure Laterality Date  . ABDOMINAL HERNIA REPAIR    . APPENDECTOMY   06/21/1985  . CARDIAC CATHETERIZATION  2010; 2011   ; Dr Fletcher Anon  . CORONARY ARTERY BYPASS GRAFT  04/2009   "CABG X3"  . [place]   "shrapnel in my tailbone"  . HERNIA REPAIR    . INGUINAL HERNIA REPAIR Right   . LAPAROSCOPIC CHOLECYSTECTOMY    . TONSILLECTOMY AND ADENOIDECTOMY  1956     Home Meds: Prior to Admission medications   Medication Sig Start Date End Date Taking? Authorizing Provider  apixaban (ELIQUIS) 5 MG TABS tablet Take 1 tablet (5 mg total) by mouth 2 (two) times daily. 02/12/15  Yes [name], MD  atorvastatin (LIPITOR) 40 MG tablet Take 40 mg by mouth at bedtime.   Yes Historical Provider, MD  budesonide-formoterol (SYMBICORT) 160-4.5 MCG/ACT inhaler Inhale 2 puffs into the lungs 2 (two) times daily.   Yes Historical Provider, MD  Coenzyme Q10 (CO Q-10) 100 MG CAPS Take 100 mg by mouth at bedtime.    Yes Historical Provider, MD  isosorbide mononitrate (IMDUR) 30 MG 24 hr tablet Take 1 tablet (30 mg total) by mouth daily. 02/29/16  Yes Theodoro Grist, MD  Multiple Vitamin (MULTIVITAMIN WITH MINERALS) TABS tablet Take 1 tablet by mouth daily.   Yes Historical Provider, MD  nitroGLYCERIN (NITROSTAT) 0.4 MG SL tablet Place 1 tablet (0.4 mg total) under the tongue every 5 (five) minutes as needed for chest pain. 11/03/15  Yes [name], MD  prazosin (MINIPRESS) 2 MG capsule Take 2 mg by mouth at bedtime.   Yes Historical Provider, MD  tiotropium (SPIRIVA) 18 MCG inhalation capsule Place 18 mcg into inhaler and inhale at bedtime.   Yes Historical Provider, MD  traMADol (ULTRAM) 50 MG tablet Take 1 tablet (50 mg total) by mouth every 6 (six) hours as needed for moderate pain. 01/28/16  Yes Cammie Sickle, MD    Inpatient Medications:  . atorvastatin  40 mg Oral q1800  . fluticasone furoate-vilanterol  1 puff Inhalation Daily  . isosorbide mononitrate  30 mg Oral Daily  . multivitamin with minerals  1 tablet Oral Daily  . prazosin  2 mg Oral  QHS  . sodium chloride flush  3 mL Intravenous Q12H  . tiotropium  18 mcg Inhalation QHS     Allergies: No Known Allergies  Social History   Social History  . Marital status: Married    Spouse name: N/A  . Number of children: N/A  . Years of education: N/A   Occupational History  . retired [employer]   Social History Main Topics  . Smoking status: Former Smoker    Packs/day: 1.00    Years: 40.00    Types: Cigarettes    Quit date: 07/21/2006  . Smokeless tobacco: Never Used  . Alcohol use No  . Drug use: No  . Sexual activity: No  Other Topics Concern  . Not on file   Social History Narrative   Lives in [place] with wife. Dogs. Goats. Children - 4. Grandchildren - 7.      Worked - Sports coach, part time.   Diet - healthy   Exercise - very active      Service - [employer] - [place]     Family History  Problem Relation Age of Onset  . Heart disease Mother   . Coronary artery disease      family history     ROS:  Please see the history of present illness.     All other systems reviewed and negative.    Physical Exam:  Blood pressure 135/65, pulse 65, temperature 98.4 F (36.9 C), temperature source Oral, resp. rate 19, height 5\' 9"  (1.753 m), weight 229 lb 3.2 oz (104 kg), SpO2 94 %. General: Well developed, well nourished male in no acute distress. Head: Normocephalic, atraumatic, sclera non-icteric, no xanthomas, nares are without discharge. EENT: normal Lymph Nodes:  none Back: without scoliosis/kyphosis , no CVA tendersness Neck: Negative for carotid bruits. JVD not elevated. Lungs: Clear bilaterally to auscultation without wheezes, rales, or rhonchi. Breathing is unlabored. Heart: RRR with S1 S2.  2/6 systolic  murmur , rubs, or gallops appreciated. Abdomen: Soft, non-tender, non-distended with normoactive bowel sounds. No hepatomegaly. No rebound/guarding. No obvious abdominal masses. Msk:  Strength and tone  appear normal for age. Extremities: No clubbing or cyanosis. 1+ edema.  Distal pedal pulses are 2+ and equal bilaterally. Skin: Warm and Dry Neuro: Alert and oriented X 3. CN III-XII intact Grossly normal sensory and motor function . Psych:  Responds to questions appropriately with a Sleepy affect      Labs: Cardiac Enzymes No results for input(s): CKTOTAL, CKMB, TROPONINI in the last 72 hours. CBC Lab Results  Component Value Date   WBC 6.6 03/01/2016   HGB 12.8 (L) 03/01/2016   HCT 39.2 03/01/2016   MCV 96.6 03/01/2016   PLT 138 (L) 03/01/2016   PROTIME:  Recent Labs  03/01/16 0538  LABPROT 16.1*  INR 1.28   Chemistry  Recent Labs Lab 03/01/16 0538  NA 139  K 4.2  CL 103  CO2 25  BUN 13  CREATININE 1.03  CALCIUM 8.9  PROT 6.1*  BILITOT 1.5*  ALKPHOS 75  ALT 26  AST 18  GLUCOSE 122*   Lipids Lab Results  Component Value Date   CHOL 142 08/23/2015   HDL 33 (L) 08/23/2015   LDLCALC 76 08/23/2015   TRIG 167 (H) 08/23/2015   BNP No results found for: PROBNP Thyroid Function Tests: No results for input(s): TSH, T4TOTAL, T3FREE, THYROIDAB in the last 72 hours.  Invalid input(s): FREET3    Miscellaneous No results found for: DDIMER  Radiology/Studies:  Dg Chest Port 1 View  Result Date: 02/26/2016 CLINICAL DATA:  70 year old male with a history of left-sided chest pressure EXAM: PORTABLE CHEST 1 VIEW COMPARISON:  09/14/2015 FINDINGS: Cardiomediastinal silhouette unchanged. 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 Defibrillator pads project over the chest. Linear opacity at the left base is unchanged from comparison. No pneumothorax. No confluent airspace disease. No large pleural effusion. No displaced fractures identified. IMPRESSION: Chronic lung changes with basilar atelectasis/ scarring, and no radiographic evidence of superimposed acute cardiopulmonary disease. Surgical changes of median sternotomy and CABG. Signed, Dulcy Fanny. Earleen Newport, DO  [facility] Electronically Signed   By: Corrie Mckusick D.O.   On:  02/26/2016 12:01    EKG:   Tele   Frequent sinus pauses with some sinus brady into the 30s   Assessment and Plan:  Sinus node dysfunction   Fatigue  Dizziness  PAF  Ischemic Heart Disease  S/p CABG    PTSD  OSA  The patient has fatigue and positional but non-orthostatic dizziness.  The symptoms persist despite normalization of his vital signs for large portions of the day. This suggests to me that it is not a primary arrhythmia issue. He clearly has sinus node dysfunction and addressing this may require pacing as suggested by Dr. Gaylyn Cheers. I have reviewed this with the patient and his wife. However, I don't think that resolving his sinus node dysfunction we will address his symptoms.  I've asked neurology to see him because of his dizziness syndrome. Accompanied by fatigue, I wonder whether the context of his atrial fibrillation he might have had a stroke.  I wonder whether his fatigue related to the medications started for his PTSD; this thought was raised at [facility]. Giving him some time to allow this drug to dissipate makes sense to me. I cannot find it on his home med list.  I will see him again on Monday.  I will also talk with Dr. Deidre Ala as to whether catheterization may be indicated. He has known occluded vein grafts. His LIMA was patent in 2011.     Virl Axe

## 2016-03-01 NOTE — Progress Notes (Signed)
ANTICOAGULATION CONSULT NOTE  Pharmacy Consult for Heparin Indication: atrial fibrillation  No Known Allergies  Patient Measurements: Height: 5\' 9"  (175.3 cm) Weight: 229 lb 3.2 oz (104 kg) IBW/kg (Calculated) : 70.7 Heparin Dosing Weight: 93 kg  Vital Signs: Temp: 97.9 F (36.6 C) (08/12 1946) Temp Source: Oral (08/12 1946) BP: 160/72 (08/12 1946) Pulse Rate: 55 (08/12 1946)  Labs:  Recent Labs  02/29/16 0807 03/01/16 0538 03/01/16 0539 03/01/16 1833  HGB 12.6* 12.8*  --   --   HCT 35.6* 39.2  --   --   PLT 110* 138*  --   --   APTT  --  35  --  64*  LABPROT  --  16.1*  --   --   INR  --  1.28  --   --   HEPARINUNFRC  --   --  1.96*  --   CREATININE  --  1.03  --   --     Estimated Creatinine Clearance: 79.3 mL/min (by C-G formula based on SCr of 1.03 mg/dL).   Medical History: Past Medical History:  Diagnosis Date  . Arthritis    "qwhere" (02/29/2016)  . Atrial fibrillation ([place])    a. Dx 2013, recurred 02/2014, CHA2DS2VASc = 3 -->placed on Eliquis;  b. 02/2014 Echo: EF 50-55%, mid and apical anterior septum and mid and apical inf septum are abnl, mild to mod Ao sclerosis w/o AS.  Marland Kitchen Chicken pox   . Chronic lymphocytic leukemia ([place])    a. Dx 02/2014.  Marland Kitchen COPD (chronic obstructive pulmonary disease) ([place])   . Coronary artery disease    a. 04/2009 CABG x 3 (LIMA->LAD, VG->OM1, VG->PDA);  b. 09/2009 Cath: occluded VG x 2 w/ patent LIMA and L->R collats. EF 55%, mild antlat HK;  c. 10/2011 MV: EF 53%, no isch/infarct-->low risk.  Marland Kitchen Heart murmur    "since I was 70 years old"  . Hypertension   . Myocardial infarction ([place]) 2010  . OSA on CPAP   . PTSD (post-traumatic stress disorder)   . Pure hypercholesterolemia   . Rheumatic fever 1959    Medications:  Scheduled:  . atorvastatin  40 mg Oral q1800  . fluticasone furoate-vilanterol  1 puff Inhalation Daily  . isosorbide mononitrate  30 mg Oral Daily  . multivitamin with minerals  1 tablet Oral Daily  .  prazosin  2 mg Oral QHS  . sodium chloride flush  3 mL Intravenous Q12H  . tiotropium  18 mcg Inhalation QHS   Infusions:  . heparin 1,400 Units/hr (03/01/16 1000)    Assessment: 70 yo M who presented to [facility] 8/8 with chest pain and weakness.  They have adjusted medications and managed his HTN but noted several episodes of bradycardia.  Pt transferred to [facility] for pacemaker placement.  Eliquis PTA and continued at [facility].  Plan to give Eliquis dose 8/11 PM and start heparin infusion 8/12 AM in anticipation of PPM placement.  Initial aPTT is slightly subtherapeutic at 64 seconds on heparin 1400 units/hr. Nurse reports no issues with infusion or bleeding.  Goal of Therapy:  Heparin level 0.3-0.7 units/ml aPTT 66-102 seconds Monitor platelets by anticoagulation protocol: Yes   Plan:  Increase heparin to 1500 units/hr Daily aPTT/HL/CBC Monitor s/sx of bleeding  Andrey Cota. Diona Foley, PharmD, [place] Clinical Pharmacist Pager [phone] 03/01/2016 7:55 PM

## 2016-03-02 DIAGNOSIS — R5382 Chronic fatigue, unspecified: Secondary | ICD-10-CM

## 2016-03-02 LAB — GLUCOSE, CAPILLARY: GLUCOSE-CAPILLARY: 117 mg/dL — AB (ref 65–99)

## 2016-03-02 LAB — APTT
aPTT: 115 seconds — ABNORMAL HIGH (ref 24–36)
aPTT: 84 seconds — ABNORMAL HIGH (ref 24–36)

## 2016-03-02 LAB — HEPARIN LEVEL (UNFRACTIONATED): Heparin Unfractionated: 0.94 IU/mL — ABNORMAL HIGH (ref 0.30–0.70)

## 2016-03-02 MED ORDER — ATORVASTATIN CALCIUM 40 MG PO TABS
40.0000 mg | ORAL_TABLET | Freq: Every day | ORAL | Status: DC
  Administered 2016-03-02 – 2016-03-03 (×2): 40 mg via ORAL
  Filled 2016-03-02 (×2): qty 1

## 2016-03-02 NOTE — Progress Notes (Signed)
       Patient Name: [name] Date of Encounter: 03/02/2016    SUBJECTIVE: No real changes in sense of generalized fatigue. Says he is too weak to get out of bed. Wife is at bedside in concerned that his heart rate is been fluctuating between 35 and 60. He feels the same when the heart rate is 60 or 35.  Amiodarone has been discontinued.  TELEMETRY:  3.29 second cause at around 1:30 AM. Intermittent sinus bradycardia and normal sinus rhythm otherwise Vitals:   03/02/16 0452 03/02/16 0622 03/02/16 0734 03/02/16 0852  BP: (!) 90/42 (!) 105/37    Pulse: (!) 52 (!) 39    Resp: 13 15 15    Temp: 97.8 F (36.6 C)  97.8 F (36.6 C)   TempSrc: Oral  Oral   SpO2: 97% 94%  97%  Weight: 229 lb 8 oz (104.1 kg)     Height:        Intake/Output Summary (Last 24 hours) at 03/02/16 1115 Last data filed at 03/02/16 0927  Gross per 24 hour  Intake            432.5 ml  Output                0 ml  Net            432.5 ml   LABS: Basic Metabolic Panel:  Recent Labs  02/29/16 0807 03/01/16 0538  NA  --  139  K 3.9 4.2  CL  --  103  CO2  --  25  GLUCOSE  --  122*  BUN  --  13  CREATININE  --  1.03  CALCIUM  --  8.9  MG  --  2.2   CBC:  Recent Labs  02/29/16 0807 03/01/16 0538  WBC 6.9 6.6  NEUTROABS  --  4.1  HGB 12.6* 12.8*  HCT 35.6* 39.2  MCV 94.1 96.6  PLT 110* 138*     Radiology/Studies:  No new data  Physical Exam: Blood pressure (!) 105/37, pulse (!) 39, temperature 97.8 F (36.6 C), temperature source Oral, resp. rate 15, height 5\' 9"  (1.753 m), weight 229 lb 8 oz (104.1 kg), SpO2 97 %. Weight change: 4.8 oz (0.136 kg)  Wt Readings from Last 3 Encounters:  03/02/16 229 lb 8 oz (104.1 kg)  02/26/16 242 lb 9.6 oz (110 kg)  02/14/16 233 lb 8 oz (105.9 kg)   Flat affect. Weak voice with continuous deep respiratory sighing while carrying on conversation. Soft systolic murmur. Chest is clear.  ASSESSMENT:  1. Sinus node dysfunction. Heart rates  ranging between 35 and 60 bpm. The patient complains of fatigue and weakness even while at rest in bed. I do not believe the heart rate and the patient's symptoms correlate. 2. Psychosomatic component to current symptom complex seems to be a major issue. 3. Obstructive sleep apnea 4. Dizziness of uncertain etiology 5. Fatigue  Plan:  Discussion between physicians is warranted.  No new recommendations  Long discussion with patient and family. They're very frustrated and seemed to have the opinion that there is one particular problem that can be fixed which will lead to dramatic improvement.  Signed, Belva Crome III 03/02/2016, 11:15 AM

## 2016-03-02 NOTE — Progress Notes (Signed)
ANTICOAGULATION CONSULT NOTE - Follow Up Consult  Pharmacy Consult for heparin Indication: atrial fibrillation  Labs:  Recent Labs  02/29/16 0807 03/01/16 0538 03/01/16 0539 03/01/16 1833 03/02/16 0411  HGB 12.6* 12.8*  --   --   --   HCT 35.6* 39.2  --   --   --   PLT 110* 138*  --   --   --   APTT  --  35  --  64* 115*  LABPROT  --  16.1*  --   --   --   INR  --  1.28  --   --   --   HEPARINUNFRC  --   --  1.96*  --  0.94*  CREATININE  --  1.03  --   --   --      Assessment: 70yo male now above goal on heparin after rate increase.  Goal of Therapy:  aPTT 66-102 seconds   Plan:  Will decrease heparin gtt slightly to 1400 units/hr and check PTT in 6hr.  Wynona Neat, PharmD, BCPS  03/02/2016,6:14 AM

## 2016-03-02 NOTE — Progress Notes (Signed)
ANTICOAGULATION CONSULT NOTE - Follow Up Consult  Pharmacy Consult for heparin (while apixaban on hold) Indication: atrial fibrillation  Labs:  Recent Labs  02/29/16 0807  03/01/16 0538 03/01/16 0539 03/01/16 1833 03/02/16 0411 03/02/16 1359  HGB 12.6*  --  12.8*  --   --   --   --   HCT 35.6*  --  39.2  --   --   --   --   PLT 110*  --  138*  --   --   --   --   APTT  --   < > 35  --  64* 115* 84*  LABPROT  --   --  16.1*  --   --   --   --   INR  --   --  1.28  --   --   --   --   HEPARINUNFRC  --   --   --  1.96*  --  0.94*  --   CREATININE  --   --  1.03  --   --   --   --   < > = values in this interval not displayed.   Assessment: 70yo male who was on eliquis PTA and continued at [facility]. The last dose of eliquis was given the night of 8/11 and heparin infusion was started the morning of 8/12 AM in anticipation of PPM placement.  - Hgb 12.8, Plts 138  - Baseline HL elevated 1.96 due to apixaban - Baseline APTT 35 - APTT 84 at 1359 on 8/13 - HDW: 93 kg  Goal of Therapy:  aPTT 66-102 seconds   Plan:  -Will continue heparin gtt at 1400 units/hr and check next APTT and CBC with morning labs on 8/14.  -Adjust dose based on aPTT until it correlates with HL  Myer Peer Grayland Ormond), PharmD  PGY1 Pharmacy Resident Pager: [phone] 03/02/2016 3:02 PM   I discussed / reviewed the pharmacy note by Dr. Tracey Harries and I agree with the resident's findings and plans as documented.  [employer], Pharm.D., BCPS Clinical Pharmacist Pager [phone] 03/02/2016 3:12 PM

## 2016-03-02 NOTE — Progress Notes (Signed)
Pt had 3.29 second pause on telemetry.  Pt sleeping with CPAP on, pt awakened and denied any c/o.  BP 129/65.  HR currently 37-45.  Dr. Radford Pax advised via text page.  Will cont to monitor.

## 2016-03-03 ENCOUNTER — Encounter (HOSPITAL_COMMUNITY)
Admission: AD | Disposition: A | Discharge status: Home / Self Care | Payer: Self-pay | Source: Other Acute Inpatient Hospital | Attending: Cardiovascular Disease

## 2016-03-03 DIAGNOSIS — Z950 Presence of cardiac pacemaker: Secondary | ICD-10-CM

## 2016-03-03 DIAGNOSIS — I440 Atrioventricular block, first degree: Secondary | ICD-10-CM

## 2016-03-03 DIAGNOSIS — R001 Bradycardia, unspecified: Secondary | ICD-10-CM

## 2016-03-03 DIAGNOSIS — I495 Sick sinus syndrome: Secondary | ICD-10-CM

## 2016-03-03 HISTORY — PX: EP IMPLANTABLE DEVICE: SHX172B

## 2016-03-03 HISTORY — DX: Presence of cardiac pacemaker: Z95.0

## 2016-03-03 LAB — CBC
HCT: 40.3 % (ref 39.0–52.0)
Hemoglobin: 13.1 g/dL (ref 13.0–17.0)
MCH: 32.1 pg (ref 26.0–34.0)
MCHC: 32.5 g/dL (ref 30.0–36.0)
MCV: 98.8 fL (ref 78.0–100.0)
Platelets: 149 10*3/uL — ABNORMAL LOW (ref 150–400)
RBC: 4.08 MIL/uL — ABNORMAL LOW (ref 4.22–5.81)
RDW: 14.5 % (ref 11.5–15.5)
WBC: 5.2 10*3/uL (ref 4.0–10.5)

## 2016-03-03 LAB — HEPARIN LEVEL (UNFRACTIONATED): Heparin Unfractionated: 0.52 IU/mL (ref 0.30–0.70)

## 2016-03-03 LAB — APTT: aPTT: 87 seconds — ABNORMAL HIGH (ref 24–36)

## 2016-03-03 SURGERY — PACEMAKER IMPLANT
Anesthesia: LOCAL

## 2016-03-03 MED ORDER — SODIUM CHLORIDE 0.9 % IR SOLN
Status: AC
  Filled 2016-03-03: qty 2

## 2016-03-03 MED ORDER — LIDOCAINE HCL (PF) 1 % IJ SOLN
INTRAMUSCULAR | Status: DC | PRN
  Administered 2016-03-03: 36 mL via INTRADERMAL

## 2016-03-03 MED ORDER — HEPARIN (PORCINE) IN NACL 2-0.9 UNIT/ML-% IJ SOLN
INTRAMUSCULAR | Status: AC
  Filled 2016-03-03: qty 500

## 2016-03-03 MED ORDER — ACETAMINOPHEN 325 MG PO TABS: 325.0000 mg | ORAL_TABLET | ORAL | Status: DC | PRN

## 2016-03-03 MED ORDER — CHLORHEXIDINE GLUCONATE 4 % EX LIQD
60.0000 mL | Freq: Once | CUTANEOUS | Status: AC
  Administered 2016-03-03: 4 via TOPICAL
  Filled 2016-03-03: qty 60

## 2016-03-03 MED ORDER — SODIUM CHLORIDE 0.9 % IR SOLN
80.0000 mg | Status: AC
  Administered 2016-03-03: 80 mg
  Filled 2016-03-03: qty 2

## 2016-03-03 MED ORDER — CEFAZOLIN IN D5W 1 GM/50ML IV SOLN
1.0000 g | Freq: Four times a day (QID) | INTRAVENOUS | Status: AC
Start: 2016-03-03 — End: 2016-03-04
  Administered 2016-03-03 – 2016-03-04 (×3): 1 g via INTRAVENOUS
  Filled 2016-03-03 (×3): qty 50

## 2016-03-03 MED ORDER — HEPARIN (PORCINE) IN NACL 2-0.9 UNIT/ML-% IJ SOLN
INTRAMUSCULAR | Status: DC | PRN
  Administered 2016-03-03: 16:00:00

## 2016-03-03 MED ORDER — LIDOCAINE HCL (PF) 1 % IJ SOLN
INTRAMUSCULAR | Status: AC
Start: 2016-03-03 — End: 2016-03-03
  Filled 2016-03-03: qty 30

## 2016-03-03 MED ORDER — LABETALOL HCL 5 MG/ML IV SOLN
INTRAVENOUS | Status: AC
  Filled 2016-03-03: qty 4

## 2016-03-03 MED ORDER — LABETALOL HCL 5 MG/ML IV SOLN
INTRAVENOUS | Status: DC | PRN
  Administered 2016-03-03: 20 mg via INTRAVENOUS

## 2016-03-03 MED ORDER — CEFAZOLIN SODIUM-DEXTROSE 2-4 GM/100ML-% IV SOLN
2.0000 g | INTRAVENOUS | Status: AC
  Administered 2016-03-03: 2 g via INTRAVENOUS
  Filled 2016-03-03: qty 100

## 2016-03-03 MED ORDER — SODIUM CHLORIDE 0.9 % IV SOLN
INTRAVENOUS | Status: DC
  Administered 2016-03-03: 14:00:00 via INTRAVENOUS

## 2016-03-03 MED ORDER — MIDAZOLAM HCL 5 MG/5ML IJ SOLN
INTRAMUSCULAR | Status: AC
  Filled 2016-03-03: qty 5

## 2016-03-03 MED ORDER — SODIUM CHLORIDE 0.9 % IV SOLN
INTRAVENOUS | Status: DC
  Administered 2016-03-03: 15:00:00 via INTRAVENOUS

## 2016-03-03 MED ORDER — AMIODARONE HCL 200 MG PO TABS
200.0000 mg | ORAL_TABLET | Freq: Every day | ORAL | Status: DC
  Administered 2016-03-03 – 2016-03-04 (×2): 200 mg via ORAL
  Filled 2016-03-03 (×2): qty 1

## 2016-03-03 MED ORDER — ONDANSETRON HCL 4 MG/2ML IJ SOLN: 4.0000 mg | Freq: Four times a day (QID) | INTRAMUSCULAR | Status: DC | PRN

## 2016-03-03 MED ORDER — SODIUM CHLORIDE 0.9 % IV SOLN: INTRAVENOUS | Status: AC

## 2016-03-03 MED ORDER — CEFAZOLIN SODIUM-DEXTROSE 2-4 GM/100ML-% IV SOLN
INTRAVENOUS | Status: AC
  Filled 2016-03-03: qty 100

## 2016-03-03 MED ORDER — LIDOCAINE HCL (PF) 1 % IJ SOLN
INTRAMUSCULAR | Status: AC
  Filled 2016-03-03: qty 30

## 2016-03-03 MED ORDER — FENTANYL CITRATE (PF) 100 MCG/2ML IJ SOLN
INTRAMUSCULAR | Status: AC
  Filled 2016-03-03: qty 2

## 2016-03-03 SURGICAL SUPPLY — 8 items
CABLE SURGICAL S-101-97-12 (CABLE) ×2 IMPLANT
HEMOSTAT SURGICEL 2X4 FIBR (HEMOSTASIS) ×2 IMPLANT
LEAD CAPSURE NOVUS 5076-52CM (Lead) ×2 IMPLANT
LEAD CAPSURE NOVUS 5076-58CM (Lead) ×2 IMPLANT
PAD DEFIB LIFELINK (PAD) ×2 IMPLANT
PPM ADVISA MRI DR A2DR01 (Pacemaker) ×2 IMPLANT
SHEATH CLASSIC 7F (SHEATH) ×4 IMPLANT
TRAY PACEMAKER INSERTION (PACKS) ×2 IMPLANT

## 2016-03-03 NOTE — Progress Notes (Signed)
ANTICOAGULATION CONSULT NOTE - Follow Up Consult  Pharmacy Consult for heparin (while apixaban on hold) Indication: atrial fibrillation  Labs:  Recent Labs  02/29/16 0807 03/01/16 0538 03/01/16 0539  03/02/16 0411 03/02/16 1359 03/03/16 0450  HGB 12.6* 12.8*  --   --   --   --   --   HCT 35.6* 39.2  --   --   --   --   --   PLT 110* 138*  --   --   --   --   --   APTT  --  35  --   < > 115* 84* 87*  LABPROT  --  16.1*  --   --   --   --   --   INR  --  1.28  --   --   --   --   --   HEPARINUNFRC  --   --  1.96*  --  0.94*  --  0.52  CREATININE  --  1.03  --   --   --   --   --   < > = values in this interval not displayed.   Assessment: 70yo male who was on eliquis PTA and continued at [facility]. The last dose of eliquis was given the night of 8/11 and heparin infusion was started the morning of 8/12 AM in anticipation of PPM placement.   HL of 0.52 and aPTT of 87 are both within target range this morning and appear to be correlating. CBC from 8/12 was stable .   Goal of Therapy:  Heparin level 0.3-0.7 units/ml   Plan:  1. Continue heparin at 1400 units/hr 2. Daily HL and CBC   Vincenza Hews, PharmD, BCPS 03/03/2016, 7:58 AM Pager: [phone]

## 2016-03-03 NOTE — Care Management Note (Signed)
Case Management Note  Patient Details  Name: [name] MRN: [id number] Date of Birth: 05-14-1946  Subjective/Objective:      Patient is from home with wife, pta he used a cane but gets around pretty well.  He has a PCP Ronette Deter at [facility]. He also has a PCP at the [facility] in [place],  [place] called Mickel Baas at [facility] and left message to notify of hospitalization.  Patient is for a pacemaker today.  He gets his medications from the [facility] in [place] also.  And gets tramadol from [place].  At discharge NCM will need to fax  Discharge info to [facility].  NCM will cont to follow for dc needs.               Action/Plan:   Expected Discharge Date:                  Expected Discharge Plan:  Home/Self Care  In-House Referral:     Discharge planning Services  CM Consult  Post Acute Care Choice:    Choice offered to:     DME Arranged:    DME Agency:     HH Arranged:    HH Agency:     Status of Service:  In process, will continue to follow  If discussed at Long Length of Stay Meetings, dates discussed:    Additional Comments:  Zenon Mayo, RN 03/03/2016, 11:53 AM

## 2016-03-03 NOTE — Progress Notes (Signed)
SUBJECTIVE: The patient feels a little better today, "more at ease"  At this time, he denies chest pain, shortness of breath, or any new concerns.  States he wants to proceed with PPM, understands this may not solve the entirety of his symptoms.  Marland Kitchen atorvastatin  40 mg Oral QHS  . fluticasone furoate-vilanterol  1 puff Inhalation Daily  . isosorbide mononitrate  30 mg Oral Daily  . multivitamin with minerals  1 tablet Oral Daily  . prazosin  2 mg Oral QHS  . sodium chloride flush  3 mL Intravenous Q12H  . tiotropium  18 mcg Inhalation QHS      OBJECTIVE: Physical Exam: Vitals:   03/03/16 0025 03/03/16 0533 03/03/16 0756 03/03/16 0853  BP: 126/73 132/82 128/74   Pulse: (!) 48 (!) 50 (!) 48   Resp: 14 16 19    Temp: 98.2 F (36.8 C) 98.7 F (37.1 C) 98.4 F (36.9 C)   TempSrc: Oral Oral Oral   SpO2: 96% 97% 98% 98%  Weight:  228 lb 8 oz (103.6 kg)    Height:        Intake/Output Summary (Last 24 hours) at 03/03/16 1046 Last data filed at 03/03/16 0900  Gross per 24 hour  Intake              598 ml  Output              350 ml  Net              248 ml    Telemetry reveals SR, SB transient rates 40's, overnight more so, occasionaly with junctional escape beat  GEN- The patient is well appearing, alert and oriented x 3 today.   Head- normocephalic, atraumatic Eyes-  Sclera clear, conjunctiva pink Ears- hearing intact Oropharynx- clear Neck- supple, no JVP Lungs- Clear to ausculation bilaterally, normal work of breathing Heart- Regular rate and rhythm, no significant murmurs, no rubs or gallops GI- soft, NT, ND Extremities- no clubbing, cyanosis, or edema Skin- no rash or lesion Psych- euthymic mood, full affect Neuro- no gross deficits appreciated  LABS: Basic Metabolic Panel:  Recent Labs  03/01/16 0538  NA 139  K 4.2  CL 103  CO2 25  GLUCOSE 122*  BUN 13  CREATININE 1.03  CALCIUM 8.9  MG 2.2   Liver Function Tests:  Recent Labs  03/01/16 0538    AST 18  ALT 26  ALKPHOS 75  BILITOT 1.5*  PROT 6.1*  ALBUMIN 3.6   No results for input(s): LIPASE, AMYLASE in the last 72 hours. CBC:  Recent Labs  03/01/16 0538  WBC 6.6  NEUTROABS 4.1  HGB 12.8*  HCT 39.2  MCV 96.6  PLT 138*     RADIOLOGY: Dg Chest Port 1 View Result Date: 02/26/2016 CLINICAL DATA:  70 year old male with a history of left-sided chest pressure EXAM: PORTABLE CHEST 1 VIEW COMPARISON:  09/14/2015 FINDINGS: Cardiomediastinal silhouette unchanged. 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 Defibrillator pads project over the chest. Linear opacity at the left base is unchanged from comparison. No pneumothorax. No confluent airspace disease. No large pleural effusion. No displaced fractures identified. IMPRESSION: Chronic lung changes with basilar atelectasis/ scarring, and no radiographic evidence of superimposed acute cardiopulmonary disease. Surgical changes of median sternotomy and CABG. Signed, Dulcy Fanny. Earleen Newport, DO [facility] Electronically Signed   By: Corrie Mckusick D.O.   On: 02/26/2016 12:01   11/02/15: Echocardiogram Study Conclusions -  Procedure narrative: Transthoracic echocardiography. Image   quality was poor. The study was technically difficult, as a   result of poor sound wave transmission. - Left ventricle: The cavity size was mildly dilated. There was   mild concentric hypertrophy. Systolic function was normal. The   estimated ejection fraction was in the range of 55% to 60%. Wall   motion was normal; there were no regional wall motion   abnormalities. Left ventricular diastolic function parameters   were normal. - Left atrium: The atrium was mildly dilated. - Right ventricle: Systolic function was normal. - Pulmonary arteries: Systolic pressure was within the normal   range. Impressions: - Sinus bradycardia noted. No source of TIA or CVA noted.   ASSESSMENT AND PLAN:    1. Sinus node dysfunction, Hx of PAFib off his rate/rhythm controlling meds since 02/25/16     discussion regarding his symptoms, bradycardia not likely the etiology of all of his symptoms, therefor PPM not likely to resolve his fatigue, dizziness in it's entirety, though given his PAFib and bradycardia may still benefit from [facility] implant     Discussed risks/benefits of PPM implant, the patient states understanding and would like to proceed     Neuro note evaluated, dizziness not felt to be secondary to CVA/central neuro and have signed off  2. PAFib     CHA2DS2Vasc is at least 3, on Eliquis out patient, heparin here     He is planned today for possible PPM, has remained in SR and will hold his heparin gtt     Plan to resume amio and BB post PPM   3. Generalized weakness, dizzy spells     Felt to be multifactorial, PTSD meds, psychosomatic, to a lesser degree his HR     No neuro component as per neuro consult     TSH wnl  4. CAD     Denies CP  5. HTN     stable       Tommye Standard, PA-C 03/03/2016 10:46 AM  He continues with sinus node dysfunction and heart rates dipping into the 30s repeatedly even while conversing. He has need of amiodarone for control of his atrial fibrillation. Hence, we will proceed with pacing. I'm not altogether convinced that it will solve all of his issues.  The benefits and risks were reviewed including but not limited to death,  perforation, infection, lead dislodgement and device malfunction.  The patient understands agrees and is willing to proceed.

## 2016-03-04 ENCOUNTER — Inpatient Hospital Stay (HOSPITAL_COMMUNITY): Payer: Medicare HMO

## 2016-03-04 ENCOUNTER — Encounter (HOSPITAL_COMMUNITY): Payer: Self-pay | Admitting: Internal Medicine

## 2016-03-04 LAB — CBC
HCT: 37.1 % — ABNORMAL LOW (ref 39.0–52.0)
HEMOGLOBIN: 12 g/dL — AB (ref 13.0–17.0)
MCH: 31.4 pg (ref 26.0–34.0)
MCHC: 32.3 g/dL (ref 30.0–36.0)
MCV: 97.1 fL (ref 78.0–100.0)
Platelets: 148 10*3/uL — ABNORMAL LOW (ref 150–400)
RBC: 3.82 MIL/uL — AB (ref 4.22–5.81)
RDW: 14.6 % (ref 11.5–15.5)
WBC: 6.5 10*3/uL (ref 4.0–10.5)

## 2016-03-04 LAB — CORTISOL: Cortisol, Plasma: 11.3 ug/dL

## 2016-03-04 LAB — HEPARIN LEVEL (UNFRACTIONATED): HEPARIN UNFRACTIONATED: 0.11 [IU]/mL — AB (ref 0.30–0.70)

## 2016-03-04 LAB — APTT: APTT: 30 s (ref 24–36)

## 2016-03-04 LAB — CK: Total CK: 55 U/L (ref 49–397)

## 2016-03-04 LAB — VITAMIN B12: VITAMIN B 12: 668 pg/mL (ref 180–914)

## 2016-03-04 IMAGING — DX DG CHEST 2V
2 series · 2 of 2 positions shown · non-contrast
Comparison: [DATE]

CLINICAL DATA: Pacemaker placement

EXAM:
CHEST  2 VIEW

[w chest pa]
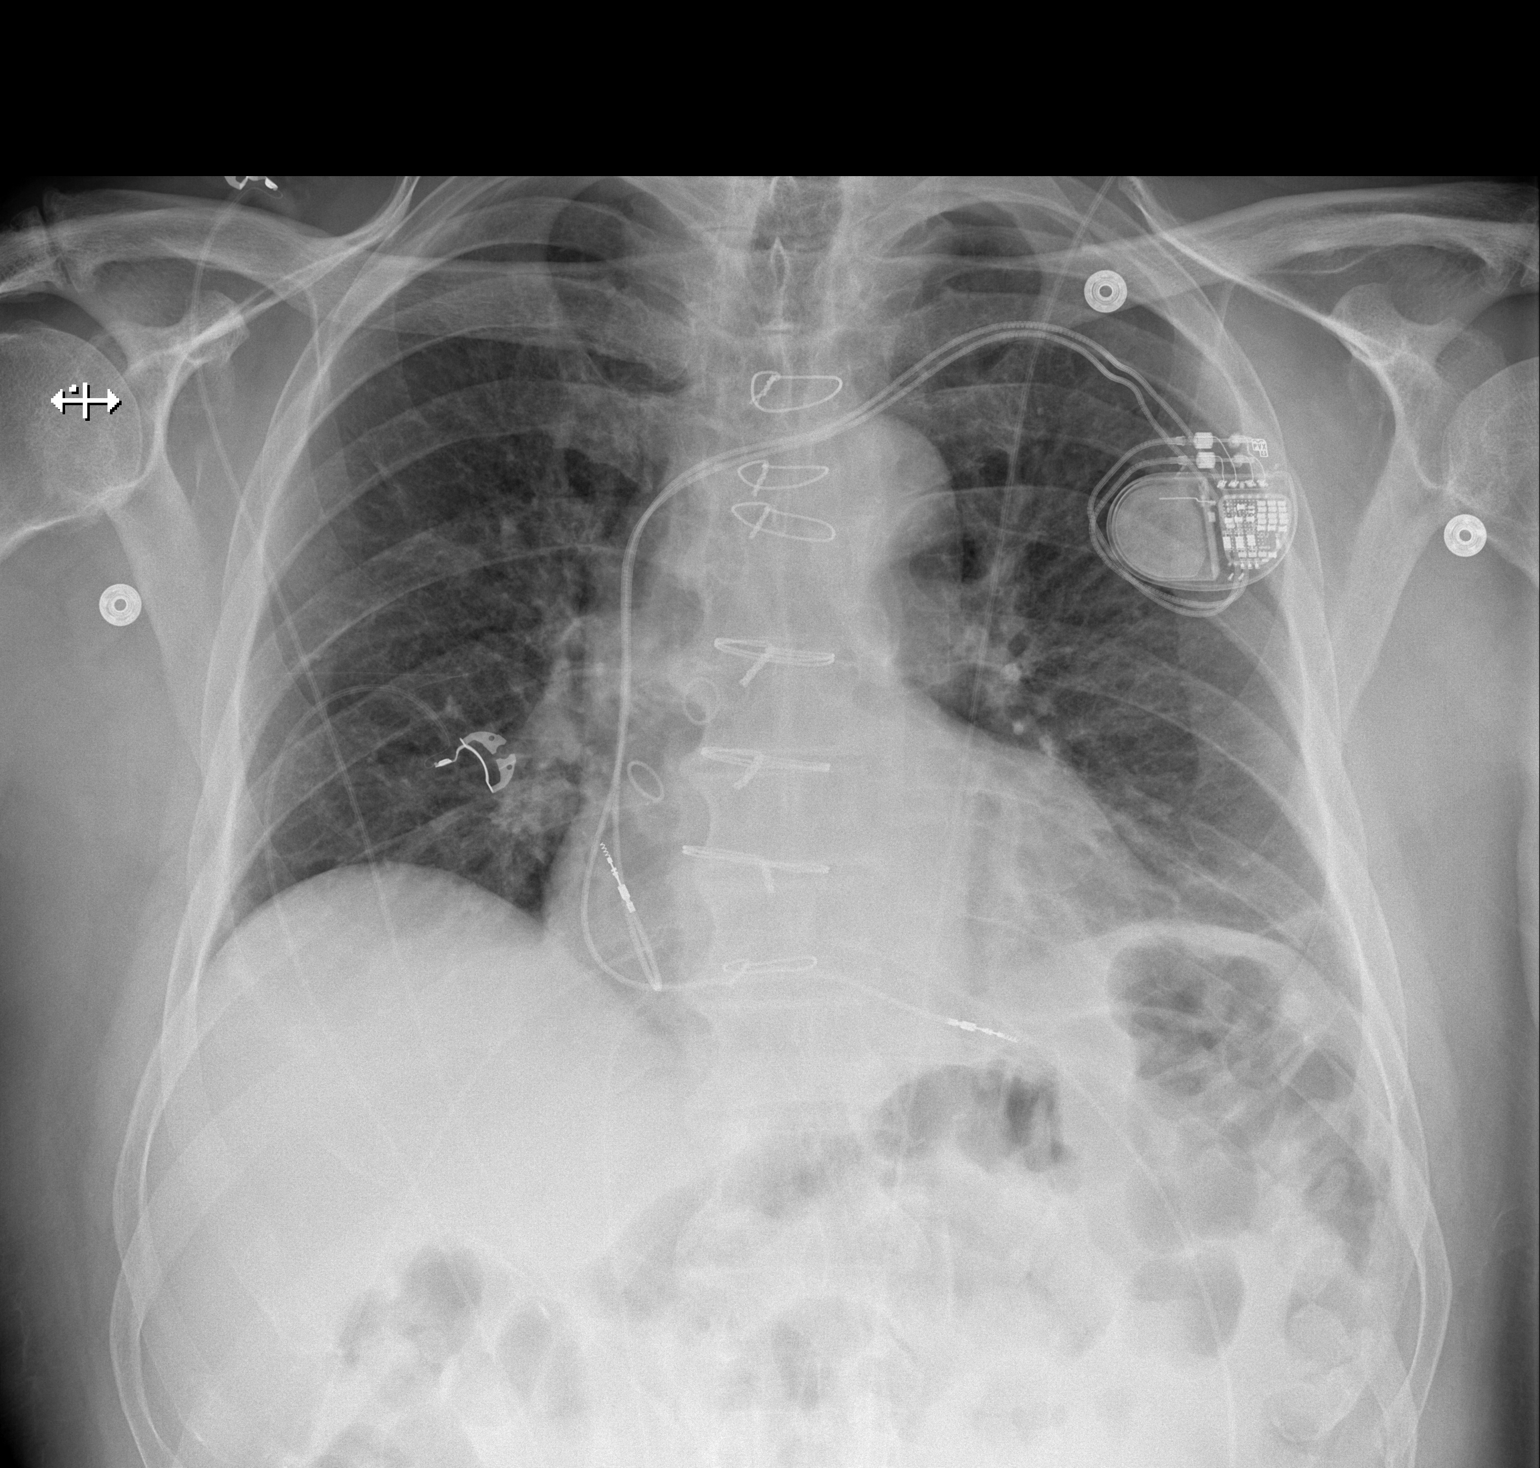

[w chest lat]
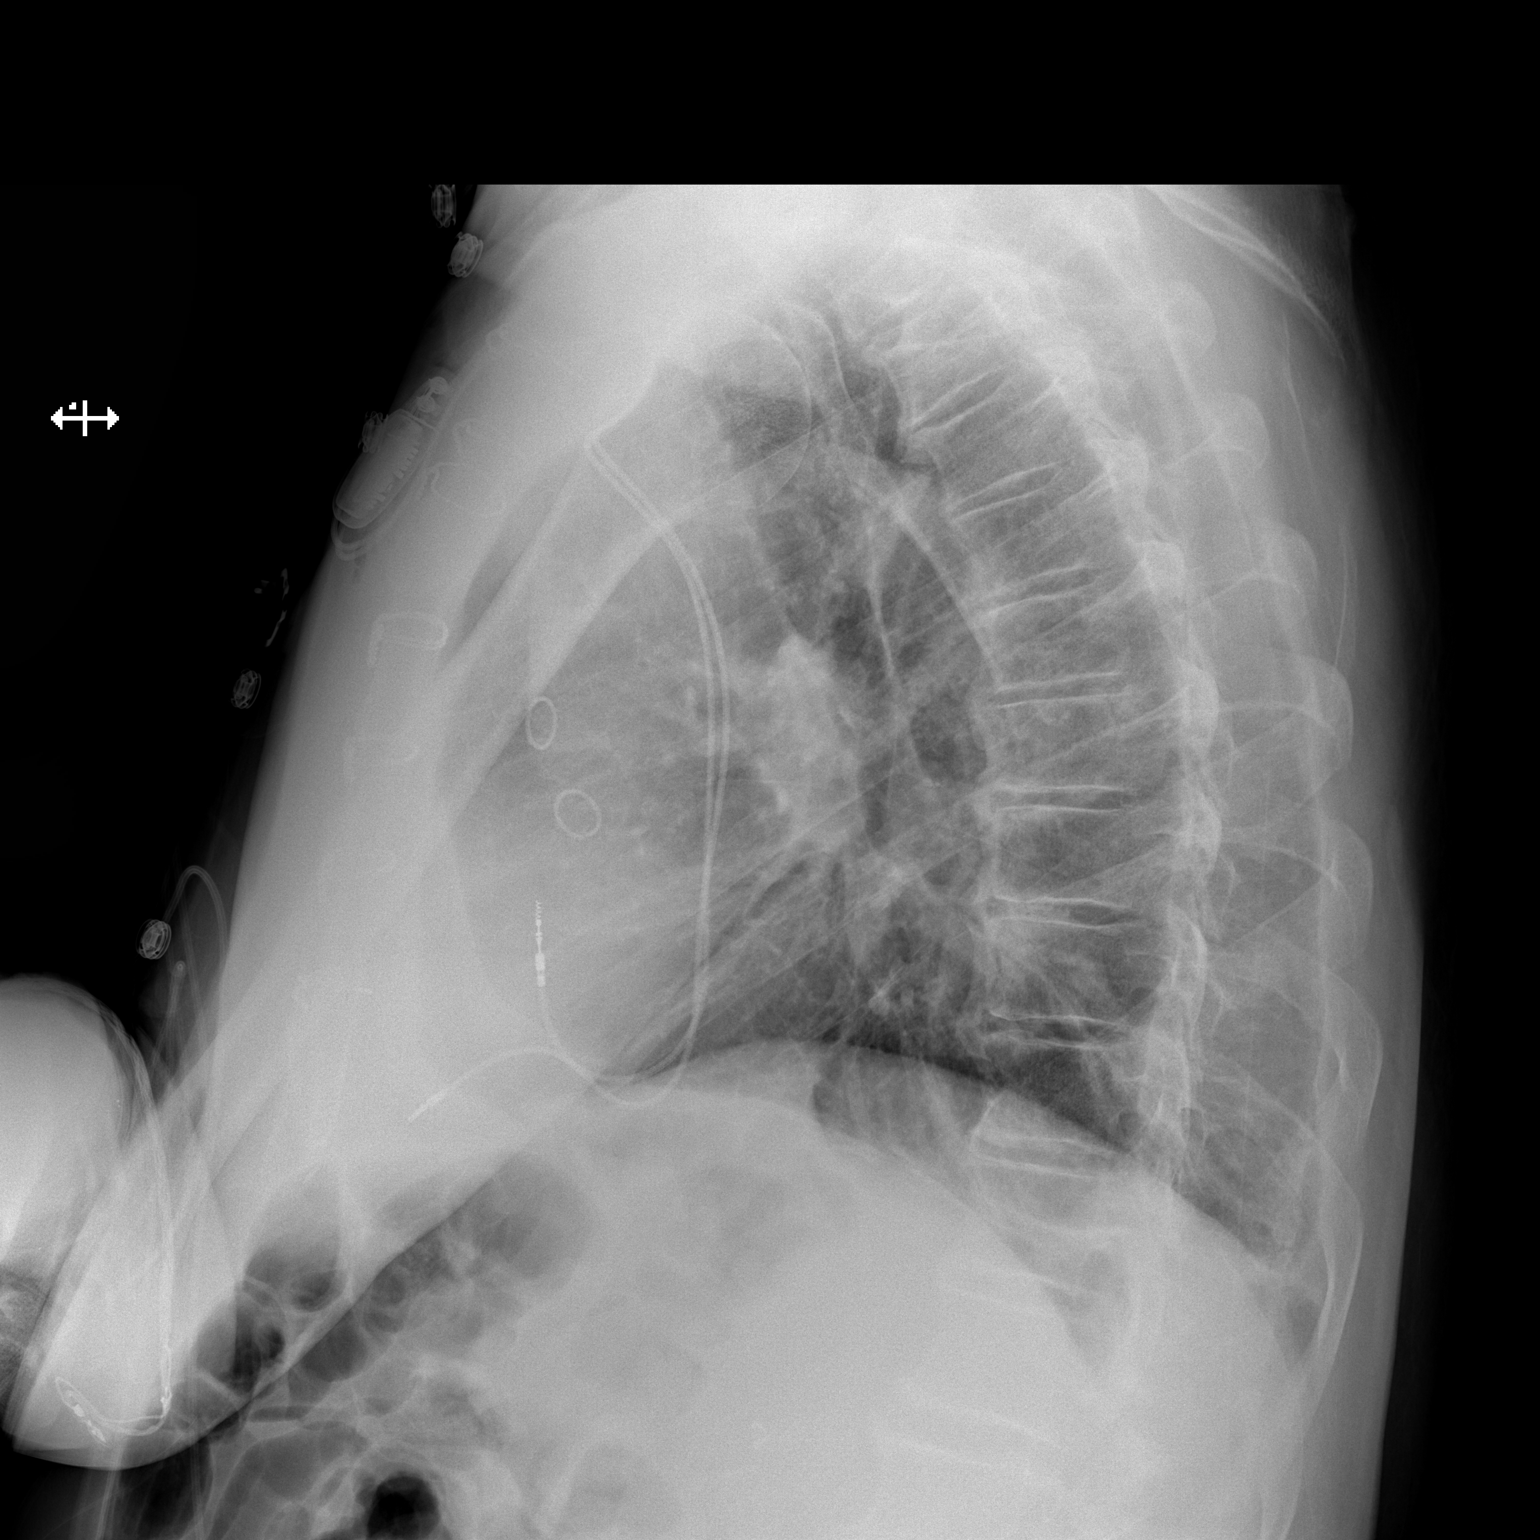

[2 of 2 positions shown; findings below may reference images not displayed]

FINDINGS: Left subclavian sequential transvenous pacemaker leads project at
right atrium and right ventricle.

Enlargement of cardiac silhouette post CABG.

Mediastinal contours and pulmonary vascularity normal.

Minimal bibasilar atelectasis.

Lungs otherwise clear.

No pleural effusion or pneumothorax.

Bones unremarkable.
IMPRESSION: No pneumothorax following LEFT subclavian pacemaker placement.

Minimal bibasilar atelectasis.

## 2016-03-04 MED ORDER — AMIODARONE HCL 200 MG PO TABS: 200.0000 mg | ORAL_TABLET | Freq: Every day | ORAL | 3 refills | Status: DC

## 2016-03-04 MED ORDER — METOPROLOL TARTRATE 25 MG PO TABS: 25.0000 mg | ORAL_TABLET | Freq: Two times a day (BID) | ORAL | Status: DC

## 2016-03-04 MED ORDER — AMLODIPINE BESYLATE 5 MG PO TABS: 5.0000 mg | ORAL_TABLET | Freq: Every day | ORAL | Status: DC

## 2016-03-04 MED FILL — Fentanyl Citrate Preservative Free (PF) Inj 100 MCG/2ML: INTRAMUSCULAR | Qty: 2 | Status: AC

## 2016-03-04 MED FILL — Midazolam HCl Inj 5 MG/5ML (Base Equivalent): INTRAMUSCULAR | Qty: 5 | Status: AC

## 2016-03-04 NOTE — Discharge Instructions (Signed)
° ° °  Supplemental Discharge Instructions for  Pacemaker/Defibrillator Patients  Activity No heavy lifting or vigorous activity with your left/right arm for 6 to 8 weeks.  Do not raise your left/right arm above your head for one week.  Gradually raise your affected arm as drawn below.             03/07/16                     03/08/16                    03/09/16                   03/10/16 __  NO DRIVING for  1 week   ; you may begin driving on [id number]    .  WOUND CARE - Keep the wound area clean and dry.  Do not get this area wet for 24 hours.  you may shower on 03/04/16 evening   . - The tape/steri-strips on your wound will fall off; do not pull them off.  No bandage is needed on the site.  DO  NOT apply any creams, oils, or ointments to the wound area. - If you notice any drainage or discharge from the wound, any swelling or bruising at the site, or you develop a fever > 101? F after you are discharged home, call the office at once.  Special Instructions - You are still able to use cellular telephones; use the ear opposite the side where you have your pacemaker/defibrillator.  Avoid carrying your cellular phone near your device. - When traveling through airports, show security personnel your identification card to avoid being screened in the metal detectors.  Ask the security personnel to use the hand wand. - Avoid arc welding equipment, MRI testing (magnetic resonance imaging), TENS units (transcutaneous nerve stimulators).  Call the office for questions about other devices. - Avoid electrical appliances that are in poor condition or are not properly grounded. - Microwave ovens are safe to be near or to operate.  Additional information for defibrillator patients should your device go off: - If your device goes off ONCE and you feel fine afterward, notify the device clinic nurses. - If your device goes off ONCE and you do not feel well afterward, call 911. - If your device goes off TWICE, call  911. - If your device goes off THREE times in one day, call 911.  DO NOT DRIVE YOURSELF OR A FAMILY MEMBER WITH A DEFIBRILLATOR TO THE HOSPITAL--CALL 911.

## 2016-03-04 NOTE — Care Management Important Message (Signed)
Important Message  Patient Details  Name: [name] MRN: [id number] Date of Birth: Jun 22, 1946   Medicare Important Message Given:  Yes    Nathen May 03/04/2016, 10:43 AM

## 2016-03-04 NOTE — Discharge Summary (Signed)
ELECTROPHYSIOLOGY PROCEDURE DISCHARGE SUMMARY    Patient ID: [name],  MRN: [id number], DOB/AGE: 71-Dec-1947 71 y.o.  Admit date: 02/29/2016 Discharge date: 03/04/2016  Primary Care Physician: [name], MD Primary Cardiologist: Dr. Rockey Situ Electrophysiologist: Dr. Caryl Comes  Primary Discharge Diagnosis:  1. Bradycardia, pauses 2. PAfib     CHA2DS2Vasc is at least , on Eliquis 3. Weakness/fatigue 4. Dizziness  Secondary Discharge Diagnosis:  1. PTSD 2. CAD 3. COPD 4. HTN 5. OSA uses CPAP 6. HLD  No Known Allergies   Procedures This Admission:  1.  Implantation of a MDT dual chamber PPM on 03/03/16 by Dr Caryl Comes.  The patient received aMedtronic MRI compatible pulse generator serial number [id number] H. Medtronic MRI compatible 5076 ventricular lead serial number [id number] and a Medtronic MRI compatible atrial lead serial number [id number]   There were no immediate post procedure complications. 2.  CXR on 03/04/16 demonstrated no pneumothorax status post device implantation.   Brief HPI: [name] is a 70 y.o. male was originally evaluated at [facility] and transferred to [facility] with suspect symptomatic bradycardia noting SB 50's with pauses.   Hospital Course:  The patient was admitted and monitored on telemetry, was seen in consult by cardiology, EP,  and neurology as well. It was the belief that his symptoms very likely multifactorial, his dizziness occurrs in any position and not particularly upon change in position though can also happen in this scenario as well, no near syncope or syncope.  CP was part of his presenting c/o and his Troponins remained negative x3.  Generalized fatigue. Lack of energy and overall weakness was suspect to me multifactorial, bradycardia, possibly his PTSD medicine, and potentially psychosomatic.  His telemetry though continued to have bradycardic rates into the 30's with shoert pauses even while awake and talking, given his PAFib and  need for rate/rhythm controlling medicines, was elected to proceed with PPM.  He underwent implant yesterday by Dr. Caryl Comes, details noted above.  Neurology was asked to see the patient particularly in regards to his dizziness, given his PAFib,  While the note does not comment on the dizziness, and felt fatigue was not neuro, noted a normal neuro exam and no further neuro testing was recommended, the patient reports he discussed his dizziness with the neurologist as well as the fatigue and reports being told he did not think his symptoms were neurologic. Suggesting B6, B12, and CK labs, trial discontinuation of his amiodarone, repeat CPAP, and f/u with oncology.  Dr. Caryl Comes does not feel the amiodarone contributes and given his hx of AF, will be continued.  The patient is instructed to follow up with his PMD regarding his labs (remaining B6 level is pending) his c/o dizziness (has not had syncope), fatigue, though this morning he states he is feeling significantly better, is OOB and has no complaints.   He patient reports taking metoprolol. 25mg  BID at home, this is not noted on his list, will be continued. His Eliquis resumed as well.  The patient's VSS, he reports feeling markedly better today.  His wife also comments that he clearly has better energy.  The patient is laughing, joking and much more engaged, repetedly states how much better he feels.  Activity restrictions, wound care were discussed with the patient, CXR today is without pneumothorax, device check this morning with normal function.  The patient was seen by Dr. Caryl Comes and felt stable for discharge.  WE will continue his home medicines without changes, discussed with the pt.  He had listed furosemide, zaroxolyn and mirtazapine but reports no longer taking any of them and not included on his list for home.  Physical Exam: Vitals:   03/03/16 2342 03/04/16 0211 03/04/16 0451 03/04/16 0813  BP: 104/66  123/67   Pulse: 60  60 62  Resp: 18  18 17     Temp: 98.4 F (36.9 C) 97.7 F (36.5 C) 97.4 F (36.3 C) 97.2 F (36.2 C)  TempSrc: Axillary Oral Oral Oral  SpO2: 91%  95% 97%  Weight:   225 lb 11.2 oz (102.4 kg)   Height:        GEN- The patient is well appearing, alert and oriented x 3 today.   HEENT: normocephalic, atraumatic; sclera clear, conjunctiva pink; hearing intact; oropharynx clear; neck supple, no JVP Lungs- Clear to ausculation bilaterally, normal work of breathing.  No wheezes, rales, rhonchi Heart- Regular rate and rhythm, no murmurs, rubs or gallops, PMI not laterally displaced GI- soft, non-tender, non-distended Extremities- no clubbing, cyanosis, or edema MS- no significant deformity or atrophy Skin- warm and dry, no rash or lesion, left chest without hematoma/ecchymosis Psych- euthymic mood, full affect Neuro- no gross deficits   Labs:   Lab Results  Component Value Date   WBC 6.5 03/04/2016   HGB 12.0 (L) 03/04/2016   HCT 37.1 (L) 03/04/2016   MCV 97.1 03/04/2016   PLT 148 (L) 03/04/2016     Recent Labs Lab 03/01/16 0538  NA 139  K 4.2  CL 103  CO2 25  BUN 13  CREATININE 1.03  CALCIUM 8.9  PROT 6.1*  BILITOT 1.5*  ALKPHOS 75  ALT 26  AST 18  GLUCOSE 122*    Discharge Medications:    Medication List    TAKE these medications   amiodarone 200 MG tablet Commonly known as:  PACERONE Take 1 tablet (200 mg total) by mouth daily.   amLODipine 5 MG tablet Commonly known as:  NORVASC Take 1 tablet (5 mg total) by mouth daily.   apixaban 5 MG Tabs tablet Commonly known as:  ELIQUIS Take 1 tablet (5 mg total) by mouth 2 (two) times daily.   atorvastatin 40 MG tablet Commonly known as:  LIPITOR Take 40 mg by mouth at bedtime.   budesonide-formoterol 160-4.5 MCG/ACT inhaler Commonly known as:  SYMBICORT Inhale 2 puffs into the lungs 2 (two) times daily.   Co Q-10 100 MG Caps Take 100 mg by mouth at bedtime.   isosorbide mononitrate 30 MG 24 hr tablet Commonly known as:   IMDUR Take 1 tablet (30 mg total) by mouth daily.   metoprolol tartrate 25 MG tablet Commonly known as:  LOPRESSOR Take 1 tablet (25 mg total) by mouth 2 (two) times daily.   multivitamin with minerals Tabs tablet Take 1 tablet by mouth daily.   nitroGLYCERIN 0.4 MG SL tablet Commonly known as:  NITROSTAT Place 1 tablet (0.4 mg total) under the tongue every 5 (five) minutes as needed for chest pain.   prazosin 2 MG capsule Commonly known as:  MINIPRESS Take 2 mg by mouth at bedtime.   tiotropium 18 MCG inhalation capsule Commonly known as:  SPIRIVA Place 18 mcg into inhaler and inhale at bedtime.   traMADol 50 MG tablet Commonly known as:  ULTRAM Take 1 tablet (50 mg total) by mouth every 6 (six) hours as needed for moderate pain.       Disposition:  Home Discharge Instructions    Diet - low sodium heart healthy  Complete by:  As directed   Increase activity slowly    Complete by:  As directed     Follow-up Information    [facility] Follow up on 03/17/2016.   Specialty:  Cardiology Why:  12:00PM (noon), wound check Contact information: [address] [place]       Virl Axe, MD Follow up on 05/22/2016.   Specialty:  Cardiology Why:  10:15AM Contact information: [place] [address] [phone]           Duration of Discharge Encounter: Greater than 30 minutes including physician time.  Venetia Night, PA-C 03/04/2016 11:33 AM   Insturtions reviewed Continue previous medicines

## 2016-03-05 ENCOUNTER — Inpatient Hospital Stay: Payer: Medicare HMO | Admitting: Internal Medicine

## 2016-03-05 ENCOUNTER — Inpatient Hospital Stay: Payer: Medicare HMO

## 2016-03-06 LAB — VITAMIN B6: VITAMIN B6: 9.4 ug/L (ref 5.3–46.7)

## 2016-03-07 DIAGNOSIS — E7800 Pure hypercholesterolemia, unspecified: Secondary | ICD-10-CM | POA: Diagnosis not present

## 2016-03-07 DIAGNOSIS — J449 Chronic obstructive pulmonary disease, unspecified: Secondary | ICD-10-CM | POA: Diagnosis not present

## 2016-03-07 DIAGNOSIS — I4891 Unspecified atrial fibrillation: Secondary | ICD-10-CM | POA: Diagnosis not present

## 2016-03-07 DIAGNOSIS — I10 Essential (primary) hypertension: Secondary | ICD-10-CM | POA: Diagnosis not present

## 2016-03-07 DIAGNOSIS — Z0000 Encounter for general adult medical examination without abnormal findings: Secondary | ICD-10-CM | POA: Diagnosis not present

## 2016-03-07 DIAGNOSIS — I2510 Atherosclerotic heart disease of native coronary artery without angina pectoris: Secondary | ICD-10-CM | POA: Diagnosis not present

## 2016-03-07 DIAGNOSIS — I4892 Unspecified atrial flutter: Secondary | ICD-10-CM | POA: Diagnosis not present

## 2016-03-07 DIAGNOSIS — R69 Illness, unspecified: Secondary | ICD-10-CM | POA: Diagnosis not present

## 2016-03-07 DIAGNOSIS — C9111 Chronic lymphocytic leukemia of B-cell type in remission: Secondary | ICD-10-CM | POA: Diagnosis not present

## 2016-03-07 DIAGNOSIS — I472 Ventricular tachycardia: Secondary | ICD-10-CM | POA: Diagnosis not present

## 2016-03-12 DIAGNOSIS — G4733 Obstructive sleep apnea (adult) (pediatric): Secondary | ICD-10-CM | POA: Diagnosis not present

## 2016-03-17 ENCOUNTER — Ambulatory Visit (INDEPENDENT_AMBULATORY_CARE_PROVIDER_SITE_OTHER): Payer: Medicare HMO | Admitting: *Deleted

## 2016-03-17 DIAGNOSIS — R001 Bradycardia, unspecified: Secondary | ICD-10-CM | POA: Diagnosis not present

## 2016-03-17 DIAGNOSIS — I480 Paroxysmal atrial fibrillation: Secondary | ICD-10-CM

## 2016-03-17 LAB — CUP PACEART INCLINIC DEVICE CHECK
Brady Statistic AS VP Percent: 0 %
Brady Statistic RA Percent Paced: 95.29 %
Brady Statistic RV Percent Paced: 0.09 %
Date Time Interrogation Session: 20170828124855
Implantable Lead Implant Date: 20170814
Implantable Lead Location: 753859
Implantable Lead Location: 753860
Implantable Lead Model: 5076
Lead Channel Impedance Value: 342 Ohm
Lead Channel Pacing Threshold Pulse Width: 0.4 ms
Lead Channel Pacing Threshold Pulse Width: 0.4 ms
Lead Channel Sensing Intrinsic Amplitude: 25.875 mV
Lead Channel Sensing Intrinsic Amplitude: 31.625 mV
Lead Channel Setting Pacing Amplitude: 3.5 V
Lead Channel Setting Pacing Pulse Width: 0.4 ms
MDC IDC LEAD IMPLANT DT: 20170814
MDC IDC MSMT BATTERY REMAINING LONGEVITY: 137 mo
MDC IDC MSMT BATTERY VOLTAGE: 3.12 V
MDC IDC MSMT LEADCHNL RA IMPEDANCE VALUE: 437 Ohm
MDC IDC MSMT LEADCHNL RA PACING THRESHOLD AMPLITUDE: 1 V
MDC IDC MSMT LEADCHNL RA SENSING INTR AMPL: 2.625 mV
MDC IDC MSMT LEADCHNL RA SENSING INTR AMPL: 2.75 mV
MDC IDC MSMT LEADCHNL RV IMPEDANCE VALUE: 456 Ohm
MDC IDC MSMT LEADCHNL RV IMPEDANCE VALUE: 532 Ohm
MDC IDC MSMT LEADCHNL RV PACING THRESHOLD AMPLITUDE: 1.25 V
MDC IDC SET LEADCHNL RA PACING AMPLITUDE: 3.5 V
MDC IDC SET LEADCHNL RV SENSING SENSITIVITY: 0.9 mV
MDC IDC STAT BRADY AP VP PERCENT: 0.08 %
MDC IDC STAT BRADY AP VS PERCENT: 95.21 %
MDC IDC STAT BRADY AS VS PERCENT: 4.71 %

## 2016-03-17 NOTE — Progress Notes (Signed)
Wound check appointment. Dermabond removed. Wound without redness or edema. Incision edges approximated, wound well healed. Normal device function. Thresholds, sensing, and impedances consistent with implant measurements. Device programmed at 3.5V for extra safety margin until 3 month visit. Histogram distribution appropriate for patient and level of activity. No mode switches or high ventricular rates noted. Patient educated about wound care, arm mobility, lifting restrictions. ROV with [name] in [place] 05/22/16.

## 2016-03-25 ENCOUNTER — Encounter: Payer: Self-pay | Admitting: Cardiovascular Disease

## 2016-03-25 ENCOUNTER — Ambulatory Visit (INDEPENDENT_AMBULATORY_CARE_PROVIDER_SITE_OTHER): Payer: Medicare HMO | Admitting: Cardiovascular Disease

## 2016-03-25 VITALS — BP 175/95 | HR 69 | Ht 69.0 in | Wt 229.5 lb

## 2016-03-25 DIAGNOSIS — I25701 Atherosclerosis of coronary artery bypass graft(s), unspecified, with angina pectoris with documented spasm: Secondary | ICD-10-CM | POA: Diagnosis not present

## 2016-03-25 DIAGNOSIS — E7800 Pure hypercholesterolemia, unspecified: Secondary | ICD-10-CM

## 2016-03-25 DIAGNOSIS — I10 Essential (primary) hypertension: Secondary | ICD-10-CM

## 2016-03-25 DIAGNOSIS — I5032 Chronic diastolic (congestive) heart failure: Secondary | ICD-10-CM

## 2016-03-25 DIAGNOSIS — I4891 Unspecified atrial fibrillation: Secondary | ICD-10-CM | POA: Diagnosis not present

## 2016-03-25 DIAGNOSIS — I455 Other specified heart block: Secondary | ICD-10-CM

## 2016-03-25 DIAGNOSIS — J432 Centrilobular emphysema: Secondary | ICD-10-CM

## 2016-03-25 MED ORDER — ISOSORBIDE MONONITRATE ER 30 MG PO TB24: 30.0000 mg | ORAL_TABLET | Freq: Every day | ORAL | 6 refills | Status: DC

## 2016-03-25 NOTE — Progress Notes (Signed)
Cardiology Office Note  Date:  03/25/2016   ID:  [name], DOB Jan 12, 1946, MRN [id number]  PCP:  Michael Mast, MD   Chief Complaint  Patient presents with  . Other    C/o elevated BP and dizziness. Meds reviewed verbally with pt.    HPI:  [name] is a 70 year old gentleman with a history of smoking, coronary artery disease, cabg, Last catheterization March 2011 with occluded vein graft 2, patent LIMA, collaterals from left to right, atrial fibrillation March 2013, History of sick sinus syndrome, symptomatic bradycardia with recent pacemaker placement 02/2016, who presents for follow-up of his coronary artery disease and atrial fibrillation. 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 Converted back to normal with metoprolol. Possible conversion pause.  In follow-up, he reports that he is doing better after pacemaker placement Denies any shortness of breath or chest pain Main complaint is spinning, lightheadedness lasting for several seconds Morning and evening  BP high morning and evening Not taking imdur (started in hospital 02/2016, did not start) Blood pressure has been running high mornings and evenings in particular Very active, working on his farm No significant lower extremity edema  EKG on today's visit shows paced rhythm with rate 69 bpm, no significant ST or T-wave changes  Other past medical history reviewed Working with the [facility] for PTSD, reports feeling well, stable Reports that he is been compliant with his Lasix, taking 80 mg daily which is higher dose than before for the past 5 days, no improvement in his leg swelling  atrial fibrillation/flutter starting September 12 2015, then persistent flutter In the clinic his heart rate was 126 bpm Amiodarone dosing was increased, amlodipine was changed to diltiazem 120 mg daily, metoprolol increased up to 25 mill grams twice a day Converted to normal  sinus rhythm at home  tachycardia concerning for atrial fibrillation 05/10/2015. Lasted only several minutes Rate possibly up to 140 bpm.  Previously has been very active, building a barn, tool shed, taking care of animals some benefits from the [place] for agent orange exposure and his cancer  total cholesterol 137, LDL 86, earlier in the year  chronic leg pain since he was young.  Previous leg pain with Crestor  admitted to the hospital on September 26 2011 for malaise, appearing pale, irregular heart rhythm and noted to be in atrial fibrillation. He was given medication for rhythm control and he converted to normal sinus rhythm later that evening. No longer on anticoagulation. He has been maintaining normal sinus rhythm No symptoms concerning for atrial fibrillation since that time  Echocardiogram showed ejection fraction 35-45%, mildly elevated right ventricular systolic pressures estimated at 30-40 mm mercury.  Previous catheterization showing occluded LAD in the mid vessel, 60% left main, 99% distal RCA who was sent to [name] in October 2010 for bypass surgery. He received 3 vessel bypass by [name] with a LIMA to the LAD, vein graft to the OM1 and vein graft to the PDA, with subsequent cardiac catheter March 2011 for chest discomfort showing occluded vein grafts x2 with patent LIMA. He has collateral vessels from left to right. Ejection fraction 55% mild anterolateral hypokinesis.  PMH:   has a past medical history of Arthritis; Atrial fibrillation ([place]); Chicken pox; Chronic lymphocytic leukemia ([place]); COPD (chronic obstructive pulmonary disease) ([place]); Coronary artery disease; Heart murmur; Hypertension; Myocardial infarction ([place]) (2010); OSA on CPAP; PTSD (post-traumatic stress disorder); Pure hypercholesterolemia; and Rheumatic fever (  1959).  PSH:    Past Surgical History:  Procedure Laterality Date  . ABDOMINAL HERNIA REPAIR    . APPENDECTOMY  06/21/1985  . CARDIAC  CATHETERIZATION  2010; 2011   ; Dr [name]  . CORONARY ARTERY BYPASS GRAFT  04/2009   "CABG X3"  . EP IMPLANTABLE DEVICE N/A 03/03/2016   Procedure: Pacemaker Implant;  Surgeon: Michael Sprang, MD;  Location: [facility];  Service: Cardiovascular;  Laterality: N/A;  . FOREIGN BODY REMOVAL  1968   "shrapnel in my tailbone"  . HERNIA REPAIR    . INGUINAL HERNIA REPAIR Right   . LAPAROSCOPIC CHOLECYSTECTOMY    . TONSILLECTOMY AND ADENOIDECTOMY  1956    Current Outpatient Prescriptions  Medication Sig Dispense Refill  . amiodarone (PACERONE) 200 MG tablet Take 1 tablet (200 mg total) by mouth daily. 30 tablet 3  . amLODipine (NORVASC) 5 MG tablet Take 1 tablet (5 mg total) by mouth daily.    Marland Kitchen apixaban (ELIQUIS) 5 MG TABS tablet Take 1 tablet (5 mg total) by mouth 2 (two) times daily. 60 tablet 6  . atorvastatin (LIPITOR) 40 MG tablet Take 40 mg by mouth at bedtime.    . budesonide-formoterol (SYMBICORT) 160-4.5 MCG/ACT inhaler Inhale 2 puffs into the lungs 2 (two) times daily.    . Coenzyme Q10 (CO Q-10) 100 MG CAPS Take 100 mg by mouth at bedtime.     . isosorbide mononitrate (IMDUR) 30 MG 24 hr tablet Take 1 tablet (30 mg total) by mouth daily. 30 tablet 6  . metoprolol tartrate (LOPRESSOR) 25 MG tablet Take 1 tablet (25 mg total) by mouth 2 (two) times daily.    . Multiple Vitamin (MULTIVITAMIN WITH MINERALS) TABS tablet Take 1 tablet by mouth daily.    . nitroGLYCERIN (NITROSTAT) 0.4 MG SL tablet Place 1 tablet (0.4 mg total) under the tongue every 5 (five) minutes as needed for chest pain. 25 tablet 6  . prazosin (MINIPRESS) 2 MG capsule Take 2 mg by mouth at bedtime.    Marland Kitchen tiotropium (SPIRIVA) 18 MCG inhalation capsule Place 18 mcg into inhaler and inhale at bedtime.    . traMADol (ULTRAM) 50 MG tablet Take 1 tablet (50 mg total) by mouth every 6 (six) hours as needed for moderate pain. 90 tablet 0   No current facility-administered medications for this visit.      Allergies:    Review of patient's allergies indicates no known allergies.   Social History:  The patient  reports that he quit smoking about 9 years ago. His smoking use included Cigarettes. He has a 40.00 pack-year smoking history. He has never used smokeless tobacco. He reports that he does not drink alcohol or use drugs.   Family History:   family history includes Heart disease in his mother.    Review of Systems: Review of Systems  Constitutional: Negative.   Respiratory: Negative.   Cardiovascular: Negative.   Gastrointestinal: Negative.   Musculoskeletal: Negative.   Neurological: Positive for dizziness.  Psychiatric/Behavioral: Negative.   All other systems reviewed and are negative.    PHYSICAL EXAM: VS:  BP (!) 175/95 (BP Location: Left Arm, Patient Position: Sitting, Cuff Size: Large)   Pulse 69   Ht 5\' 9"  (1.753 m)   Wt 229 lb 8 oz (104.1 kg)   BMI 33.89 kg/m  , BMI Body mass index is 33.89 kg/m. GEN: Well nourished, well developed, in no acute distress  HEENT: normal  Neck: no JVD, carotid bruits,  or masses Cardiac: RRR; no murmurs, rubs, or gallops,no edema  Respiratory:  clear to auscultation bilaterally, normal work of breathing GI: soft, nontender, nondistended, + BS MS: no deformity or atrophy  Skin: warm and dry, no rash Neuro:  Strength and sensation are intact Psych: euthymic mood, full affect    Recent Labs: 02/26/2016: B Natriuretic Peptide 248.0; TSH 1.256 03/01/2016: ALT 26; BUN 13; Creatinine, Ser 1.03; Magnesium 2.2; Potassium 4.2; Sodium 139 03/04/2016: Hemoglobin 12.0; Platelets 148    Lipid Panel Lab Results  Component Value Date   CHOL 142 08/23/2015   HDL 33 (L) 08/23/2015   LDLCALC 76 08/23/2015   TRIG 167 (H) 08/23/2015      Wt Readings from Last 3 Encounters:  03/25/16 229 lb 8 oz (104.1 kg)  03/04/16 225 lb 11.2 oz (102.4 kg)  02/26/16 242 lb 9.6 oz (110 kg)       ASSESSMENT AND PLAN:  Atrial fibrillation, unspecified type ([place]) -  Plan: EKG 12-Lead Maintaining normal sinus rhythm, We'll continue amiodarone, metoprolol anticoagulation  HYPERCHOLESTEROLEMIA Continue Lipitor 40 mg daily Goal LDL less than 70  Essential hypertension He did not start isosorbide first initiated during hospitalization August 2017 Recommended he start Imdur 30 mg daily, close monitoring of blood pressure  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 Recommended he closely monitor his weight, call our office for worsening ankle swelling  Centrilobular emphysema ([place]) Takes several inhalers, reports his breathing is stable. Has follow-up with pulmonary  Sinus pause Recent pacemaker placement, feels better overall Has follow-up with Dr. Caryl Comes    Total encounter time more than 25 minutes  Greater than 50% was spent in counseling and coordination of care with the patient   Disposition:   F/U  3 months   Orders Placed This Encounter  Procedures  . EKG 12-Lead     Signed, Esmond Plants, M.D., Ph.D. 03/25/2016  [place], [place]

## 2016-03-25 NOTE — Patient Instructions (Signed)
Medication Instructions:   Please start isosorbide at night Please call if blood pressure continues to run high   You could take meclizine for dizziness as needed (lasts 6 hours) It is sold over the counter If it does not resolve, we could send you to ENT  Labwork:  No new labs needed  Testing/Procedures:  No further testing at this time   Follow-Up: It was a pleasure seeing you in the office today. Please call us if you have new issues that need to be addressed before your next appt.  [phone]  Your physician wants you to follow-up in: 3 months.  You will receive a reminder letter in the mail two months in advance. If you don't receive a letter, please call our office to schedule the follow-up appointment.  If you need a refill on your cardiac medications before your next appointment, please call your pharmacy.

## 2016-03-31 ENCOUNTER — Emergency Department: Payer: Medicare HMO

## 2016-03-31 ENCOUNTER — Emergency Department
Admission: EM | Admit: 2016-03-31 | Discharge: 2016-03-31 | Disposition: A | Discharge status: Home / Self Care | Payer: Medicare HMO | Attending: Emergency Medicine | Admitting: Emergency Medicine

## 2016-03-31 DIAGNOSIS — Y939 Activity, unspecified: Secondary | ICD-10-CM | POA: Insufficient documentation

## 2016-03-31 DIAGNOSIS — Y929 Unspecified place or not applicable: Secondary | ICD-10-CM | POA: Insufficient documentation

## 2016-03-31 DIAGNOSIS — W19XXXA Unspecified fall, initial encounter: Secondary | ICD-10-CM

## 2016-03-31 DIAGNOSIS — I5032 Chronic diastolic (congestive) heart failure: Secondary | ICD-10-CM | POA: Diagnosis not present

## 2016-03-31 DIAGNOSIS — S79911A Unspecified injury of right hip, initial encounter: Secondary | ICD-10-CM | POA: Diagnosis not present

## 2016-03-31 DIAGNOSIS — Y999 Unspecified external cause status: Secondary | ICD-10-CM | POA: Diagnosis not present

## 2016-03-31 DIAGNOSIS — S300XXA Contusion of lower back and pelvis, initial encounter: Secondary | ICD-10-CM | POA: Diagnosis not present

## 2016-03-31 DIAGNOSIS — Z87891 Personal history of nicotine dependence: Secondary | ICD-10-CM | POA: Insufficient documentation

## 2016-03-31 DIAGNOSIS — I2510 Atherosclerotic heart disease of native coronary artery without angina pectoris: Secondary | ICD-10-CM | POA: Insufficient documentation

## 2016-03-31 DIAGNOSIS — W11XXXA Fall on and from ladder, initial encounter: Secondary | ICD-10-CM | POA: Diagnosis not present

## 2016-03-31 DIAGNOSIS — M25551 Pain in right hip: Secondary | ICD-10-CM | POA: Diagnosis not present

## 2016-03-31 DIAGNOSIS — J449 Chronic obstructive pulmonary disease, unspecified: Secondary | ICD-10-CM | POA: Insufficient documentation

## 2016-03-31 DIAGNOSIS — S3992XA Unspecified injury of lower back, initial encounter: Secondary | ICD-10-CM | POA: Diagnosis present

## 2016-03-31 DIAGNOSIS — I110 Hypertensive heart disease with heart failure: Secondary | ICD-10-CM | POA: Diagnosis not present

## 2016-03-31 IMAGING — CR DG HIP (WITH OR WITHOUT PELVIS) 2-3V*R*
1 series · 3 of 3 positions shown · non-contrast
Comparison: None.

CLINICAL DATA: Fall from ladder 3 days ago. Bruise to right
buttock. Heard a pop this morning in right hip. Right hip pain.

EXAM:
DG HIP (WITH OR WITHOUT PELVIS) 2-3V RIGHT

[Series 1: dg hip unilat w or w/o pelvis 2-3 views  · non-contrast · 0.14mm/px · 3 of 3 slices shown]
[im 1/3]
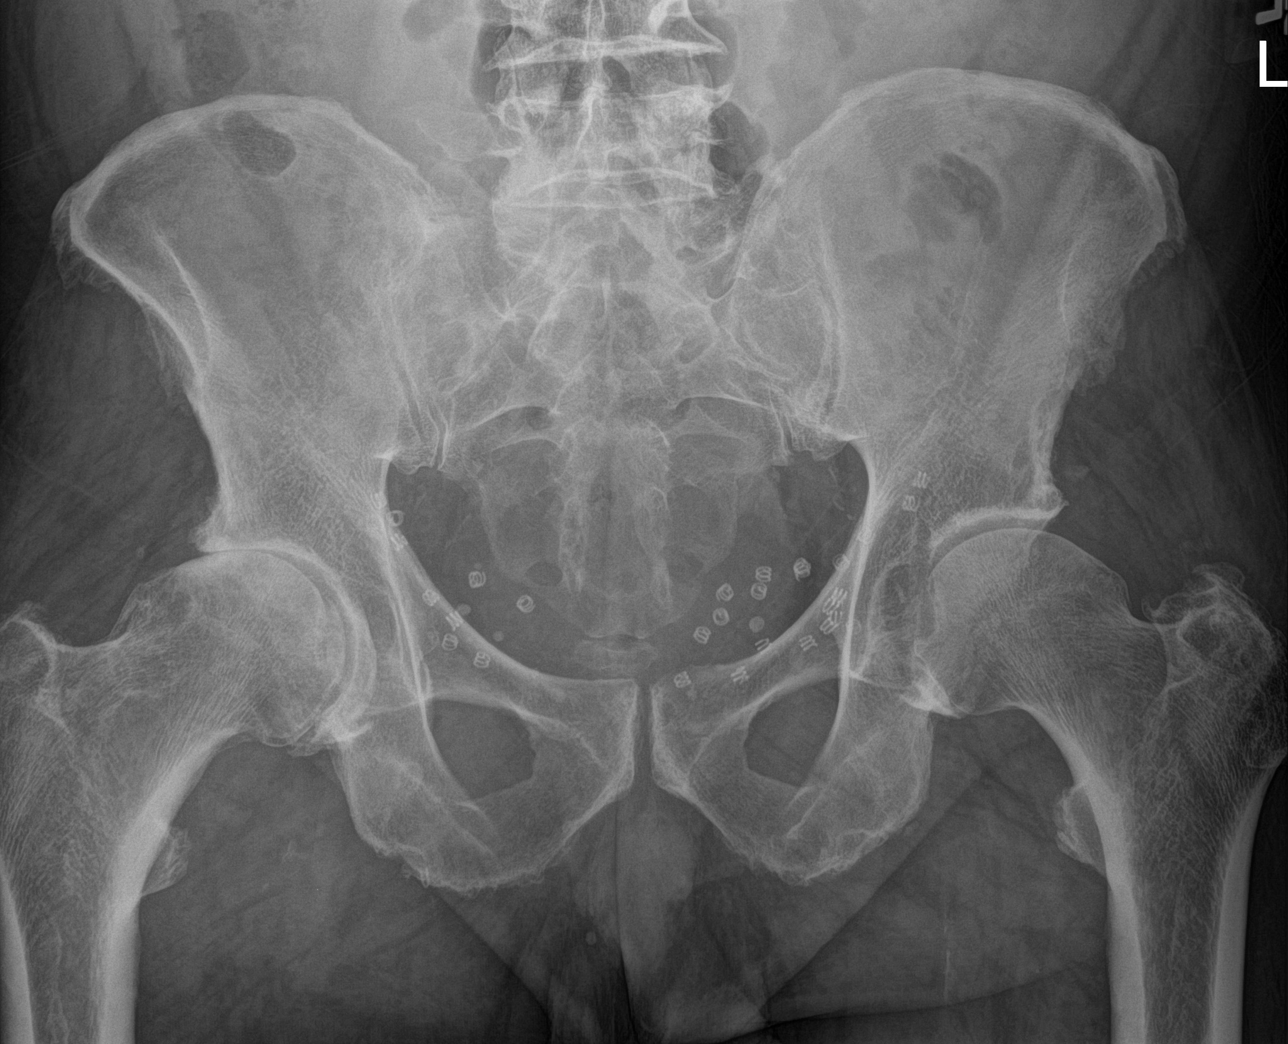
[im 2/3]
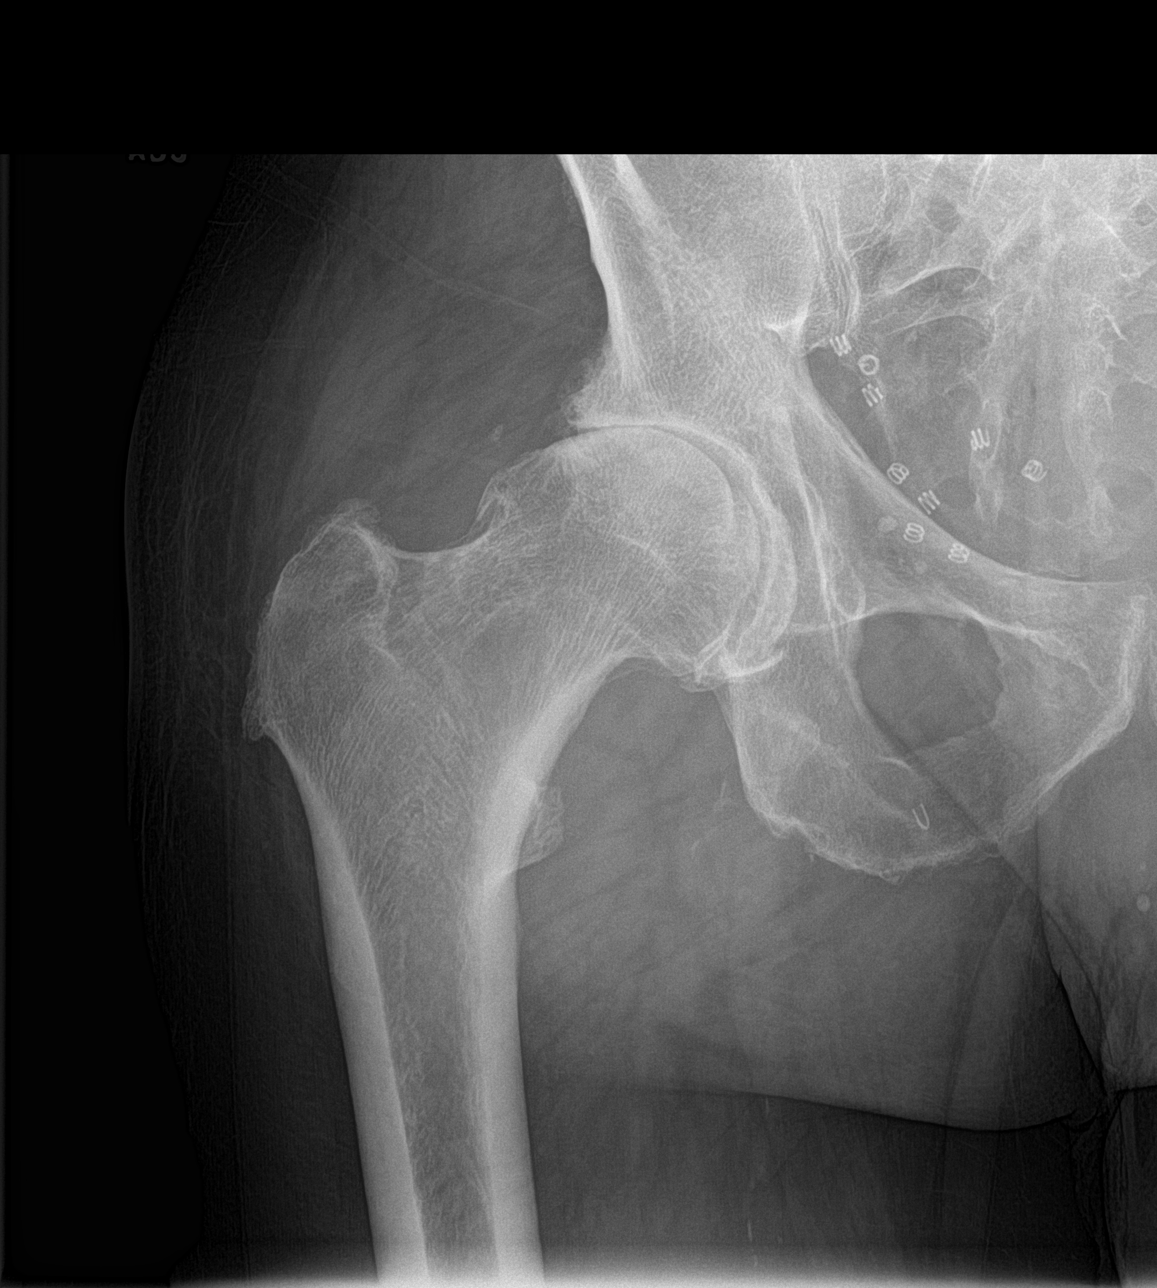
[im 3/3]
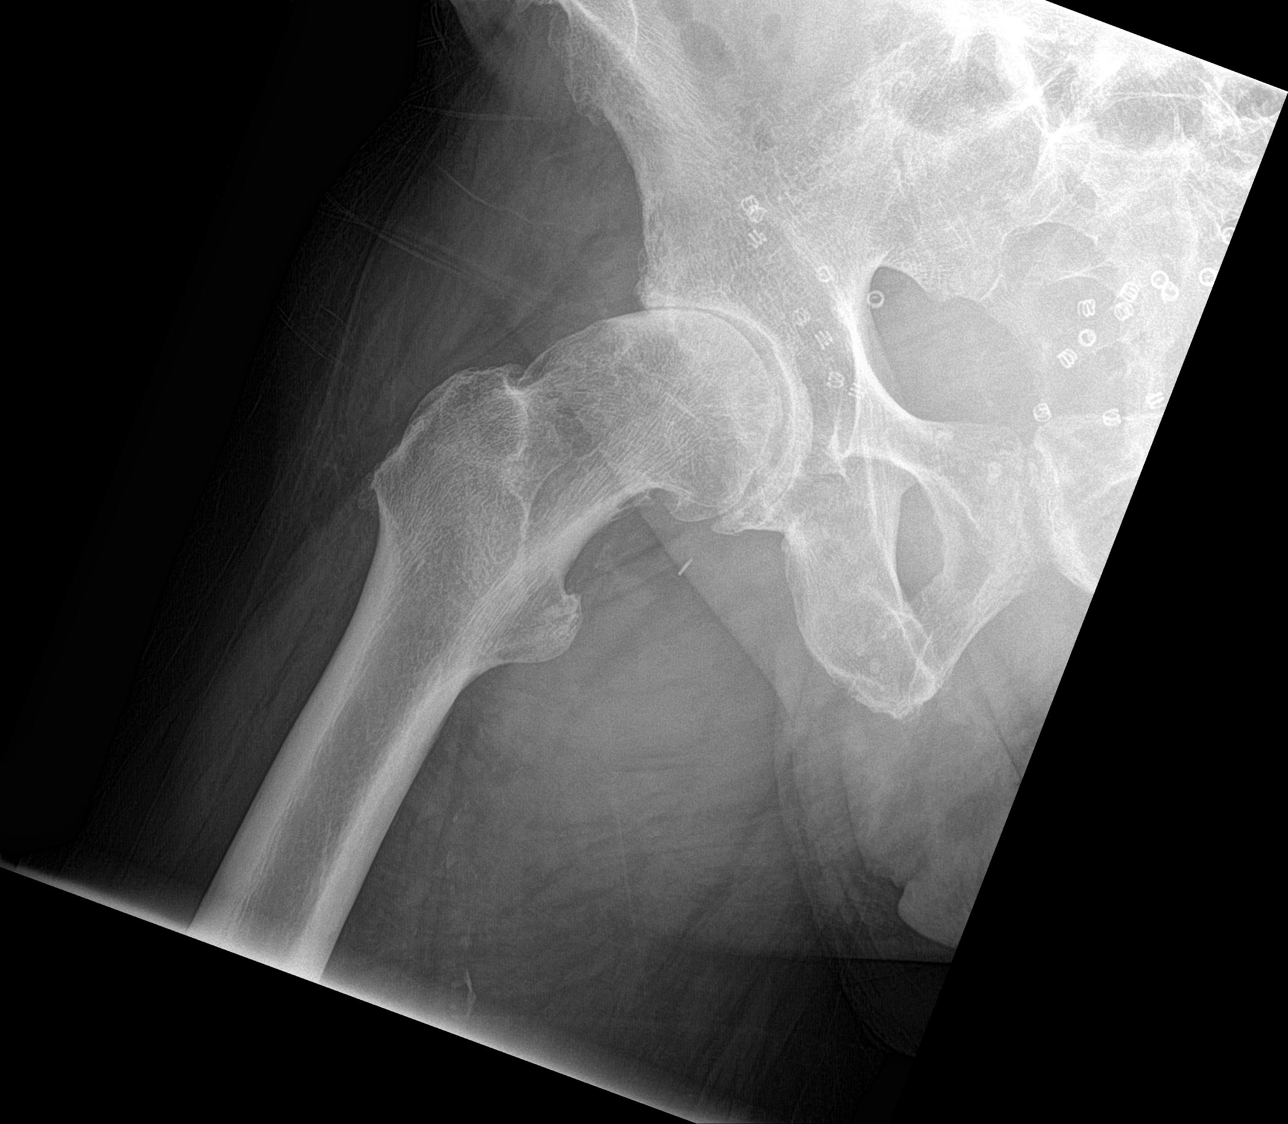

[3 of 3 positions shown; findings below may reference images not displayed]

FINDINGS: Advanced degenerative changes in the right hip. Moderate
degenerative changes in the left hip. SI joints are symmetric and
unremarkable. No acute bony abnormality. Specifically, no fracture,
subluxation, or dislocation. Soft tissues are intact.
IMPRESSION: Degenerative changes in the hips bilaterally, right worse than left.
No acute bony abnormality.

## 2016-03-31 MED ORDER — OXYCODONE-ACETAMINOPHEN 5-325 MG PO TABS: 1.0000 | ORAL_TABLET | Freq: Four times a day (QID) | ORAL | 0 refills | Status: DC | PRN

## 2016-03-31 MED ORDER — OXYCODONE-ACETAMINOPHEN 5-325 MG PO TABS
1.0000 | ORAL_TABLET | Freq: Once | ORAL | Status: AC
  Administered 2016-03-31: 1 via ORAL
  Filled 2016-03-31: qty 1

## 2016-03-31 NOTE — ED Notes (Signed)
EDP at bedside  

## 2016-03-31 NOTE — ED Provider Notes (Signed)
[facility] Emergency Department Provider Note ____________________________________________   I have reviewed the triage vital signs and the triage nursing note.  HISTORY  Chief Complaint Hip Pain and Fall   Historian Patient  HPI [name] is a 70 y.o. male who fell off of a ladder on Friday landing on his right buttock, he had had some pain since then. He is on Xarelto and has quite a bruise on his right buttock. He has been able to walk although is hobbling some. Denies head injury. Denies chest pain or trouble breathing or chest injury. Denies flank pain, abdominal pain, or hematuria. No weakness or numbness. He states that he was letting his dog out this morning he was standing on the deck and felt up pop and severe pain at his right hip laterally, without any weakness or numbness. He has been taking tramadol without significant pain relief at home.    Past Medical History:  Diagnosis Date  . Arthritis    "qwhere" (02/29/2016)  . Atrial fibrillation ([place])    a. Dx 2013, recurred 02/2014, CHA2DS2VASc = 3 -->placed on Eliquis;  b. 02/2014 Echo: EF 50-55%, mid and apical anterior septum and mid and apical inf septum are abnl, mild to mod Ao sclerosis w/o AS.  Marland Kitchen Chicken pox   . Chronic lymphocytic leukemia ([place])    a. Dx 02/2014.  Marland Kitchen COPD (chronic obstructive pulmonary disease) ([place])   . Coronary artery disease    a. 04/2009 CABG x 3 (LIMA->LAD, VG->OM1, VG->PDA);  b. 09/2009 Cath: occluded VG x 2 w/ patent LIMA and L->R collats. EF 55%, mild antlat HK;  c. 10/2011 MV: EF 53%, no isch/infarct-->low risk.  Marland Kitchen Heart murmur    "since I was 70 years old"  . Hypertension   . Myocardial infarction ([place]) 2010  . OSA on CPAP   . PTSD (post-traumatic stress disorder)   . Pure hypercholesterolemia   . Rheumatic fever 1959    Patient Active Problem List   Diagnosis Date Noted  . Chronic fatigue   . Generalized weakness 02/29/2016  . Thrombocytopenia ([place])  02/29/2016  . Hypokalemia 02/29/2016  . Chest discomfort 02/29/2016  . Sinus pause 02/29/2016  . First degree AV block   . Bilateral edema of lower extremity 01/11/2016  . 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 PAD (peripheral artery disease) ([employer])   . OSA on CPAP   . TIA (transient ischemic attack) 11/02/2015  . Chronic diastolic CHF (congestive heart failure) ([place]) 09/20/2015  . Atrial flutter ([place]) 09/17/2015  . Atrial fibrillation with RVR ([place]) 09/14/2015  . Pre-syncope 08/23/2015  . Near syncope   . CLL (chronic lymphocytic leukemia) ([place])   . Symptomatic bradycardia   . PTSD (post-traumatic stress disorder)   . Chest pain 08/22/2015  . Osteoarthritis of both knees 07/05/2015  . Chronic lymphatic leukemia.  Stage 2 given leukocytosis, lymphadenopathy and splenomegaly. Sept 2015 - Rapidly progressive bulky lymphadenopathy. Oct 2015 - Positive for ZAP-70 and CD38. 03/16/2014  . Medicare annual wellness visit, subsequent 07/08/2012  . Screening for colon cancer 01/07/2012  . COPD (chronic obstructive pulmonary disease) ([place]) 01/01/2012  . Shortness of breath 10/09/2011  . Paroxysmal atrial fibrillation ([place]) 10/09/2011  . HYPERCHOLESTEROLEMIA 11/28/2009  . Essential hypertension 11/28/2009  . CAD, ARTERY BYPASS GRAFT 11/28/2009  . TOBACCO ABUSE, HX OF 11/28/2009    Past Surgical History:  Procedure Laterality Date  . ABDOMINAL HERNIA REPAIR    . APPENDECTOMY  06/21/1985  . CARDIAC CATHETERIZATION  2010; 2011   ; Dr Fletcher Anon  . CORONARY ARTERY BYPASS GRAFT  04/2009   "CABG X3"  . EP IMPLANTABLE DEVICE N/A 03/03/2016   Procedure: Pacemaker Implant;  Surgeon: Deboraha Sprang, MD;  Location: [facility];  Service: Cardiovascular;  Laterality: N/A;  . FOREIGN BODY REMOVAL  1968   "shrapnel in my tailbone"  . HERNIA REPAIR    . INGUINAL HERNIA REPAIR Right   . LAPAROSCOPIC CHOLECYSTECTOMY    . TONSILLECTOMY AND ADENOIDECTOMY  1956    Prior  to Admission medications   Medication Sig Start Date End Date Taking? Authorizing Provider  amiodarone (PACERONE) 200 MG tablet Take 1 tablet (200 mg total) by mouth daily. 03/04/16   Renee Dyane Dustman, PA-C  amLODipine (NORVASC) 5 MG tablet Take 1 tablet (5 mg total) by mouth daily. 03/04/16   Renee Dyane Dustman, PA-C  apixaban (ELIQUIS) 5 MG TABS tablet Take 1 tablet (5 mg total) by mouth 2 (two) times daily. 02/12/15   [name], MD  atorvastatin (LIPITOR) 40 MG tablet Take 40 mg by mouth at bedtime.    Historical Provider, MD  budesonide-formoterol (SYMBICORT) 160-4.5 MCG/ACT inhaler Inhale 2 puffs into the lungs 2 (two) times daily.    Historical Provider, MD  Coenzyme Q10 (CO Q-10) 100 MG CAPS Take 100 mg by mouth at bedtime.     Historical Provider, MD  isosorbide mononitrate (IMDUR) 30 MG 24 hr tablet Take 1 tablet (30 mg total) by mouth daily. 03/25/16   [name], MD  metoprolol tartrate (LOPRESSOR) 25 MG tablet Take 1 tablet (25 mg total) by mouth 2 (two) times daily. 03/04/16   Renee Dyane Dustman, PA-C  Multiple Vitamin (MULTIVITAMIN WITH MINERALS) TABS tablet Take 1 tablet by mouth daily.    Historical Provider, MD  nitroGLYCERIN (NITROSTAT) 0.4 MG SL tablet Place 1 tablet (0.4 mg total) under the tongue every 5 (five) minutes as needed for chest pain. 11/03/15   [name], MD  oxyCODONE-acetaminophen (ROXICET) 5-325 MG tablet Take 1-2 tablets by mouth every 6 (six) hours as needed for severe pain. 03/31/16   Lisa Roca, MD  prazosin (MINIPRESS) 2 MG capsule Take 2 mg by mouth at bedtime.    Historical Provider, MD  tiotropium (SPIRIVA) 18 MCG inhalation capsule Place 18 mcg into inhaler and inhale at bedtime.    Historical Provider, MD  traMADol (ULTRAM) 50 MG tablet Take 1 tablet (50 mg total) by mouth every 6 (six) hours as needed for moderate pain. 01/28/16   Cammie Sickle, MD    No Known Allergies  Family History  Problem Relation Age of Onset  . Heart disease  Mother   . Coronary artery disease      family history    Social History Social History  Substance Use Topics  . Smoking status: Former Smoker    Packs/day: 1.00    Years: 40.00    Types: Cigarettes    Quit date: 07/21/2006  . Smokeless tobacco: Never Used  . Alcohol use No    Review of Systems  Constitutional: Negative for fever. Eyes: Negative for visual changes. ENT: Negative for sore throat. Cardiovascular: Negative for chest pain. Respiratory: Negative for shortness of breath. Gastrointestinal: Negative for abdominal pain, vomiting and diarrhea. Genitourinary: Negative for dysuria. Musculoskeletal: Negative for back pain. Skin: Negative for rash. Neurological: Negative for headache. 10 point Review of Systems otherwise negative ____________________________________________   PHYSICAL EXAM:  VITAL  SIGNS: ED Triage Vitals  Enc Vitals Group     BP 03/31/16 1527 (!) 150/78     Pulse Rate 03/31/16 1527 64     Resp 03/31/16 1527 16     Temp 03/31/16 1527 98.3 F (36.8 C)     Temp Source 03/31/16 1527 Oral     SpO2 03/31/16 1527 98 %     Weight 03/31/16 1527 230 lb (104.3 kg)     Height 03/31/16 1527 5\' 9"  (1.753 m)     Head Circumference --      Peak Flow --      Pain Score 03/31/16 1717 10     Pain Loc --      Pain Edu? --      Excl. in [place]? --      Constitutional: Alert and oriented. Well appearing and in no distress. HEENT   Head: Normocephalic and atraumatic.      Eyes: Conjunctivae are normal. PERRL. Normal extraocular movements.      Ears:         Nose: No congestion/rhinnorhea.   Mouth/Throat: Mucous membranes are moist.   Neck: No stridor. Cardiovascular/Chest: Normal peripheral wrist pulses. Respiratory: Normal respiratory effort without tachypnea nor retractions.  Gastrointestinal: Soft. No distention, no guarding, no rebound. Nontender.    Genitourinary/rectal:Deferred Musculoskeletal: Right buttock significantly swollen and firm with  large deep purple ecchymosis. Thigh compartment soft. Neurologic:  Normal speech and language. No gross or focal neurologic deficits are appreciated. Skin:  Skin is warm, dry and intact. No rash noted. Psychiatric: Mood and affect are normal. Speech and behavior are normal. Patient exhibits appropriate insight and judgment.   ____________________________________________  LABS (pertinent positives/negatives)  Labs Reviewed - No data to display  ____________________________________________    EKG I, Lisa Roca, MD, the attending physician have personally viewed and interpreted all ECGs.  None ____________________________________________  RADIOLOGY All Xrays were viewed by me. Imaging interpreted by Radiologist.  Right hip xray with pelvis:IMPRESSION: Degenerative changes in the hips bilaterally, right worse than left. No acute bony abnormality. __________________________________________  PROCEDURES  Procedure(s) performed: None  Critical Care performed: None  ____________________________________________   ED COURSE / ASSESSMENT AND PLAN  Pertinent labs & imaging results that were available during my care of the patient were reviewed by me and considered in my medical decision making (see chart for details).  Mr. Atlee is here for evaluation of right hip pain, persistent after a fall onto his right buttock where he has a large hematoma related to the fact that he is on Xarelto. No head injury, chest injury, abdominal injury. Hematoma looks to be concentrated at the buttock, nothing visualized on the flank. X-ray shows no evidence of fracture. He has been able to bear weight and I'm not suspicious of an occult fracture this point time. I have instructed him that if he is not better in a week, he may need further imaging with MRI.  I will prescribe Roxicet for pain.    CONSULTATIONS:   None   Patient / Family / Caregiver informed of clinical course, medical  decision-making process, and agree with plan.   I discussed return precautions, follow-up instructions, and discharge instructions with patient and/or family.   ___________________________________________   FINAL CLINICAL IMPRESSION(S) / ED DIAGNOSES   Final diagnoses:  Fall, initial encounter  Traumatic hematoma of buttock, initial encounter              Note: This dictation was prepared with Dragon dictation. Any transcriptional  errors that result from this process are unintentional    Lisa Roca, MD 03/31/16 1753

## 2016-03-31 NOTE — Discharge Instructions (Signed)
Your x-ray does not show any sign of fracture. I suspect your pain is from the large bruise, and possibly other muscular or tendon type injury, which might be further evaluated with an MRI if not improved in about a week. Please follow-up with your orthopedic doctor, Dr. Marry Guan within the next 1 week for further evaluation of the hip.  Return to the emergency department for any worsening pain, numbness or weakness, any abdominal pain, bloody urine, or any other symptoms concerning to you.

## 2016-03-31 NOTE — ED Triage Notes (Addendum)
Pt reports he fell 8 feet off a ladder 3 days ago resulting in a large bruise to his R buttock. Pt reports the pain was relieved by ice and heat however when he got up this morning he heard a "pop" and his R hip has been hurting worse since. Pt appears uncomfortable. Sensation and mobility intact.

## 2016-04-03 DIAGNOSIS — S7001XA Contusion of right hip, initial encounter: Secondary | ICD-10-CM | POA: Diagnosis not present

## 2016-04-08 MED ORDER — ONDANSETRON HCL 4 MG/2ML IJ SOLN
INTRAMUSCULAR | Status: AC
  Filled 2016-04-08: qty 2

## 2016-04-08 MED ORDER — MORPHINE SULFATE (PF) 4 MG/ML IV SOLN
INTRAVENOUS | Status: AC
  Filled 2016-04-08: qty 1

## 2016-04-10 DIAGNOSIS — S7001XD Contusion of right hip, subsequent encounter: Secondary | ICD-10-CM | POA: Diagnosis not present

## 2016-04-10 DIAGNOSIS — M5441 Lumbago with sciatica, right side: Secondary | ICD-10-CM | POA: Diagnosis not present

## 2016-04-11 ENCOUNTER — Inpatient Hospital Stay: Payer: Medicare HMO | Attending: Internal Medicine | Admitting: Internal Medicine

## 2016-04-11 ENCOUNTER — Encounter: Payer: Self-pay | Admitting: Internal Medicine

## 2016-04-11 ENCOUNTER — Other Ambulatory Visit: Payer: Self-pay

## 2016-04-11 ENCOUNTER — Inpatient Hospital Stay (HOSPITAL_BASED_OUTPATIENT_CLINIC_OR_DEPARTMENT_OTHER): Payer: Medicare HMO

## 2016-04-11 DIAGNOSIS — Z87891 Personal history of nicotine dependence: Secondary | ICD-10-CM

## 2016-04-11 DIAGNOSIS — I10 Essential (primary) hypertension: Secondary | ICD-10-CM | POA: Diagnosis not present

## 2016-04-11 DIAGNOSIS — E7800 Pure hypercholesterolemia, unspecified: Secondary | ICD-10-CM

## 2016-04-11 DIAGNOSIS — M129 Arthropathy, unspecified: Secondary | ICD-10-CM | POA: Diagnosis not present

## 2016-04-11 DIAGNOSIS — J449 Chronic obstructive pulmonary disease, unspecified: Secondary | ICD-10-CM

## 2016-04-11 DIAGNOSIS — G4730 Sleep apnea, unspecified: Secondary | ICD-10-CM | POA: Diagnosis not present

## 2016-04-11 DIAGNOSIS — I2510 Atherosclerotic heart disease of native coronary artery without angina pectoris: Secondary | ICD-10-CM | POA: Diagnosis not present

## 2016-04-11 DIAGNOSIS — R69 Illness, unspecified: Secondary | ICD-10-CM | POA: Diagnosis not present

## 2016-04-11 DIAGNOSIS — Z79899 Other long term (current) drug therapy: Secondary | ICD-10-CM

## 2016-04-11 DIAGNOSIS — Z950 Presence of cardiac pacemaker: Secondary | ICD-10-CM

## 2016-04-11 DIAGNOSIS — C9111 Chronic lymphocytic leukemia of B-cell type in remission: Secondary | ICD-10-CM | POA: Diagnosis not present

## 2016-04-11 DIAGNOSIS — C9110 Chronic lymphocytic leukemia of B-cell type not having achieved remission: Secondary | ICD-10-CM

## 2016-04-11 DIAGNOSIS — I4891 Unspecified atrial fibrillation: Secondary | ICD-10-CM | POA: Diagnosis not present

## 2016-04-11 DIAGNOSIS — M6281 Muscle weakness (generalized): Secondary | ICD-10-CM | POA: Diagnosis not present

## 2016-04-11 DIAGNOSIS — M25651 Stiffness of right hip, not elsewhere classified: Secondary | ICD-10-CM | POA: Diagnosis not present

## 2016-04-11 DIAGNOSIS — I252 Old myocardial infarction: Secondary | ICD-10-CM

## 2016-04-11 DIAGNOSIS — F4310 Post-traumatic stress disorder, unspecified: Secondary | ICD-10-CM

## 2016-04-11 DIAGNOSIS — M25551 Pain in right hip: Secondary | ICD-10-CM | POA: Diagnosis not present

## 2016-04-11 DIAGNOSIS — R011 Cardiac murmur, unspecified: Secondary | ICD-10-CM | POA: Diagnosis not present

## 2016-04-11 DIAGNOSIS — Z7901 Long term (current) use of anticoagulants: Secondary | ICD-10-CM

## 2016-04-11 LAB — COMPREHENSIVE METABOLIC PANEL
ALBUMIN: 4.2 g/dL (ref 3.5–5.0)
ALK PHOS: 76 U/L (ref 38–126)
ALT: 26 U/L (ref 17–63)
ANION GAP: 3 — AB (ref 5–15)
AST: 23 U/L (ref 15–41)
BUN: 20 mg/dL (ref 6–20)
CALCIUM: 9 mg/dL (ref 8.9–10.3)
CHLORIDE: 106 mmol/L (ref 101–111)
CO2: 30 mmol/L (ref 22–32)
Creatinine, Ser: 0.94 mg/dL (ref 0.61–1.24)
GFR calc non Af Amer: >60 mL/min (ref >60)
GLUCOSE: 112 mg/dL — AB (ref 65–99)
POTASSIUM: 4.4 mmol/L (ref 3.5–5.1)
SODIUM: 139 mmol/L (ref 135–145)
Total Bilirubin: 1.6 mg/dL — ABNORMAL HIGH (ref 0.3–1.2)
Total Protein: 6.8 g/dL (ref 6.5–8.1)

## 2016-04-11 LAB — CBC WITH DIFFERENTIAL/PLATELET
BASOS PCT: 1 %
Basophils Absolute: 0.1 10*3/uL (ref 0–0.1)
Eosinophils Absolute: 0.2 10*3/uL (ref 0–0.7)
Eosinophils Relative: 3 %
HEMATOCRIT: 35.8 % — AB (ref 40.0–52.0)
HEMOGLOBIN: 12.3 g/dL — AB (ref 13.0–18.0)
LYMPHS PCT: 25 %
Lymphs Abs: 1.7 10*3/uL (ref 1.0–3.6)
MCH: 32.5 pg (ref 26.0–34.0)
MCHC: 34.4 g/dL (ref 32.0–36.0)
MCV: 94.6 fL (ref 80.0–100.0)
MONOS PCT: 10 %
Monocytes Absolute: 0.7 10*3/uL (ref 0.2–1.0)
NEUTROS ABS: 4.2 10*3/uL (ref 1.4–6.5)
NEUTROS PCT: 61 %
Platelets: 164 10*3/uL (ref 150–440)
RBC: 3.78 MIL/uL — ABNORMAL LOW (ref 4.40–5.90)
RDW: 15 % — ABNORMAL HIGH (ref 11.5–14.5)
WBC: 6.9 10*3/uL (ref 3.8–10.6)

## 2016-04-11 NOTE — Progress Notes (Signed)
[place] off of ladder 3 weeks ago about 8 foot high.  Has pain related to fall.  SOB on exertion  Some fatigue

## 2016-04-11 NOTE — Assessment & Plan Note (Addendum)
#   CLL/SLL- status post bendamustine rituximab 6 cycles [finished in April 2016]- s/ maintenance rituximab every 2 months until March 2017. CT scan March 2017 shows- no evidence of any lymphadenopathy in the neck/chest /abdomen pelvis; no splenomegaly. CBC-within normal limits except for mild anemia hemoglobin 12.5. Clinically no evidence of recurrence. Continue surveillance  # Right arm nodule- follow-up with dermatology.  # I would recommend follow-up in 4 months/CBC CMP and LDH.

## 2016-04-11 NOTE — Progress Notes (Signed)
[facility] OFFICE PROGRESS NOTE  Patient Care Team: [name], MD as PCP - General (Internal Medicine) [name], MD as Consulting Physician (Cardiology)   SUMMARY OF ONCOLOGIC HISTORY:  Oncology History   # AUG 2015- SLL/CLL [facility] [address] Bx] s/p Benda-Rituxan x6 [finished March 2016]; Maintenance Rituxan q 88m [started April 2016; Dr.Pandit];Last Ritux Jan 2017.  MARCH 2017- CT N/C/A/P- NED. STOP Ritux; surveillance    # s/p PPM [Dr.Klein; Sep 2017]; A.fib [on eliquis]     CLL (chronic lymphocytic leukemia) (HCC)    Initial Diagnosis    CLL (chronic lymphocytic leukemia) (HCC)       INTERVAL HISTORY:  A pleasant 70 year old male patient with above history of SLL/CLL currently on surveillance is here for follow-up.   In the interim he had a pacemaker placed. He is currently on the Eliquis.    Patient's appetite is good. He denies any weight loss. Denies any profuse night sweats.  Denies any frequent infections. Denies any chest pain or cough.  In the interim patient had a mechanical fall was evaluated in the emergency room; no bleeding noted  REVIEW OF SYSTEMS:  A complete 10 point review of system is done which is negative except mentioned above/history of present illness.   PAST MEDICAL HISTORY :  Past Medical History:  Diagnosis Date  . Arthritis    "qwhere" (02/29/2016)  . Atrial fibrillation ([place])    a. Dx 2013, recurred 02/2014, CHA2DS2VASc = 3 -->placed on Eliquis;  b. 02/2014 Echo: EF 50-55%, mid and apical anterior septum and mid and apical inf septum are abnl, mild to mod Ao sclerosis w/o AS.  Marland Kitchen Chicken pox   . Chronic lymphocytic leukemia ([place])    a. Dx 02/2014.  Marland Kitchen COPD (chronic obstructive pulmonary disease) ([place])   . Coronary artery disease    a. 04/2009 CABG x 3 (LIMA->LAD, VG->OM1, VG->PDA);  b. 09/2009 Cath: occluded VG x 2 w/ patent LIMA and L->R collats. EF 55%, mild antlat HK;  c. 10/2011 MV: EF 53%, no isch/infarct-->low  risk.  Marland Kitchen Heart murmur    "since I was 70 years old"  . Hypertension   . Myocardial infarction ([place]) 2010  . OSA on CPAP   . PTSD (post-traumatic stress disorder)   . Pure hypercholesterolemia   . Rheumatic fever 1959    PAST SURGICAL HISTORY :   Past Surgical History:  Procedure Laterality Date  . ABDOMINAL HERNIA REPAIR    . APPENDECTOMY  06/21/1985  . CARDIAC CATHETERIZATION  2010; 2011   ; Dr Fletcher Anon  . CORONARY ARTERY BYPASS GRAFT  04/2009   "CABG X3"  . EP IMPLANTABLE DEVICE N/A 03/03/2016   Procedure: Pacemaker Implant;  Surgeon: Deboraha Sprang, MD;  Location: [facility];  Service: Cardiovascular;  Laterality: N/A;  . FOREIGN BODY REMOVAL  1968   "shrapnel in my tailbone"  . HERNIA REPAIR    . INGUINAL HERNIA REPAIR Right   . LAPAROSCOPIC CHOLECYSTECTOMY    . TONSILLECTOMY AND ADENOIDECTOMY  1956    FAMILY HISTORY :   Family History  Problem Relation Age of Onset  . Heart disease Mother   . Coronary artery disease      family history    SOCIAL HISTORY:   Social History  Substance Use Topics  . Smoking status: Former Smoker    Packs/day: 1.00    Years: 40.00    Types: Cigarettes    Quit date: 07/21/2006  . Smokeless  tobacco: Never Used  . Alcohol use No    ALLERGIES:  has No Known Allergies.  MEDICATIONS:  Current Outpatient Prescriptions  Medication Sig Dispense Refill  . amiodarone (PACERONE) 200 MG tablet Take 1 tablet (200 mg total) by mouth daily. 30 tablet 3  . amLODipine (NORVASC) 5 MG tablet Take 1 tablet (5 mg total) by mouth daily.    Marland Kitchen apixaban (ELIQUIS) 5 MG TABS tablet Take 1 tablet (5 mg total) by mouth 2 (two) times daily. 60 tablet 6  . atorvastatin (LIPITOR) 40 MG tablet Take 40 mg by mouth at bedtime.    . budesonide-formoterol (SYMBICORT) 160-4.5 MCG/ACT inhaler Inhale 2 puffs into the lungs 2 (two) times daily.    . Coenzyme Q10 (CO Q-10) 100 MG CAPS Take 100 mg by mouth at bedtime.     . gabapentin (NEURONTIN) 300 MG capsule  Take by mouth.    . isosorbide mononitrate (IMDUR) 30 MG 24 hr tablet Take 1 tablet (30 mg total) by mouth daily. 30 tablet 6  . metoprolol tartrate (LOPRESSOR) 25 MG tablet Take 1 tablet (25 mg total) by mouth 2 (two) times daily.    . Multiple Vitamin (MULTIVITAMIN WITH MINERALS) TABS tablet Take 1 tablet by mouth daily.    . Oxycodone HCl 10 MG TABS Take 20 mg by mouth every 4 (four) hours.    Marland Kitchen oxyCODONE-acetaminophen (ROXICET) 5-325 MG tablet Take 1-2 tablets by mouth every 6 (six) hours as needed for severe pain. 16 tablet 0  . prazosin (MINIPRESS) 2 MG capsule Take 2 mg by mouth at bedtime.    Marland Kitchen tiotropium (SPIRIVA) 18 MCG inhalation capsule Place 18 mcg into inhaler and inhale at bedtime.    . traMADol (ULTRAM) 50 MG tablet Take 1 tablet (50 mg total) by mouth every 6 (six) hours as needed for moderate pain. 90 tablet 0  . nitroGLYCERIN (NITROSTAT) 0.4 MG SL tablet Place 1 tablet (0.4 mg total) under the tongue every 5 (five) minutes as needed for chest pain. (Patient not taking: Reported on 04/11/2016) 25 tablet 6   No current facility-administered medications for this visit.     PHYSICAL EXAMINATION: ECOG PERFORMANCE STATUS: 0 - Asymptomatic  BP 128/77 (BP Location: Left Arm, Patient Position: Sitting)   Pulse 83   Temp 97.4 F (36.3 C) (Tympanic)   Resp 17   Ht 5\' 9"  (1.753 m)   Wt 225 lb 3.2 oz (102.2 kg)   BMI 33.26 kg/m   Filed Weights   04/11/16 1601  Weight: 225 lb 3.2 oz (102.2 kg)    GENERAL: Well-nourished well-developed; Alert, no distress and comfortable. He is with his wife. Walks with a cane.   EYES: no pallor or icterus OROPHARYNX: no thrush or ulceration; dentures.  NECK: supple, no masses felt LYMPH:  no palpable lymphadenopathy in the cervical, axillary or inguinal regions LUNGS: clear to auscultation and  No wheeze or crackles HEART/CVS: regular rate & rhythm and no murmurs; No lower extremity edema ABDOMEN:abdomen soft, non-tender and normal bowel  sounds Musculoskeletal:no cyanosis of digits and no clubbing  PSYCH: alert & oriented x 3 with fluent speech NEURO: no focal motor/sensory deficits SKIN:  no rashes or significant lesions  LABORATORY DATA:  I have reviewed the data as listed    Component Value Date/Time   NA 139 04/11/2016 1543   NA 139 10/11/2014 1800   K 4.4 04/11/2016 1543   K 3.3 (L) 10/11/2014 1800   CL 106 04/11/2016 1543   CL  106 10/11/2014 1800   CO2 30 04/11/2016 1543   CO2 27 10/11/2014 1800   GLUCOSE 112 (H) 04/11/2016 1543   GLUCOSE 107 (H) 10/11/2014 1800   BUN 20 04/11/2016 1543   BUN 15 10/11/2014 1800   CREATININE 0.94 04/11/2016 1543   CREATININE 0.89 10/11/2014 1800   CALCIUM 9.0 04/11/2016 1543   CALCIUM 8.8 (L) 10/11/2014 1800   PROT 6.8 04/11/2016 1543   PROT 6.4 (L) 10/11/2014 1800   ALBUMIN 4.2 04/11/2016 1543   ALBUMIN 4.1 10/11/2014 1800   AST 23 04/11/2016 1543   AST 23 10/11/2014 1800   ALT 26 04/11/2016 1543   ALT 22 10/11/2014 1800   ALKPHOS 76 04/11/2016 1543   ALKPHOS 61 10/11/2014 1800   BILITOT 1.6 (H) 04/11/2016 1543   BILITOT 0.9 10/11/2014 1800   GFRNONAA >60 04/11/2016 1543   GFRNONAA >60 10/11/2014 1800   GFRAA >60 04/11/2016 1543   GFRAA >60 10/11/2014 1800    No results found for: SPEP, UPEP  Lab Results  Component Value Date   WBC 6.9 04/11/2016   NEUTROABS 4.2 04/11/2016   HGB 12.3 (L) 04/11/2016   HCT 35.8 (L) 04/11/2016   MCV 94.6 04/11/2016   PLT 164 04/11/2016      Chemistry      Component Value Date/Time   NA 139 04/11/2016 1543   NA 139 10/11/2014 1800   K 4.4 04/11/2016 1543   K 3.3 (L) 10/11/2014 1800   CL 106 04/11/2016 1543   CL 106 10/11/2014 1800   CO2 30 04/11/2016 1543   CO2 27 10/11/2014 1800   BUN 20 04/11/2016 1543   BUN 15 10/11/2014 1800   CREATININE 0.94 04/11/2016 1543   CREATININE 0.89 10/11/2014 1800      Component Value Date/Time   CALCIUM 9.0 04/11/2016 1543   CALCIUM 8.8 (L) 10/11/2014 1800   ALKPHOS 76  04/11/2016 1543   ALKPHOS 61 10/11/2014 1800   AST 23 04/11/2016 1543   AST 23 10/11/2014 1800   ALT 26 04/11/2016 1543   ALT 22 10/11/2014 1800   BILITOT 1.6 (H) 04/11/2016 1543   BILITOT 0.9 10/11/2014 1800       ASSESSMENT & PLAN:   CLL (chronic lymphocytic leukemia) (HCC) # CLL/SLL- status post bendamustine rituximab 6 cycles [finished in April 2016]- s/ maintenance rituximab every 2 months until March 2017. CT scan March 2017 shows- no evidence of any lymphadenopathy in the neck/chest /abdomen pelvis; no splenomegaly. CBC-within normal limits except for mild anemia hemoglobin 12.5. Clinically no evidence of recurrence. Continue surveillance  # Right arm nodule- follow-up with dermatology.  # I would recommend follow-up in 4 months/CBC CMP and LDH.    Cammie Sickle, MD 04/11/2016 4:33 PM

## 2016-04-12 DIAGNOSIS — G4733 Obstructive sleep apnea (adult) (pediatric): Secondary | ICD-10-CM | POA: Diagnosis not present

## 2016-04-14 ENCOUNTER — Encounter: Payer: Self-pay | Admitting: Pulmonary Disease

## 2016-04-14 ENCOUNTER — Ambulatory Visit (INDEPENDENT_AMBULATORY_CARE_PROVIDER_SITE_OTHER): Payer: Medicare HMO | Admitting: Pulmonary Disease

## 2016-04-14 VITALS — BP 130/72 | HR 74 | Ht 69.0 in | Wt 227.8 lb

## 2016-04-14 DIAGNOSIS — J449 Chronic obstructive pulmonary disease, unspecified: Secondary | ICD-10-CM | POA: Diagnosis not present

## 2016-04-14 DIAGNOSIS — M25651 Stiffness of right hip, not elsewhere classified: Secondary | ICD-10-CM | POA: Diagnosis not present

## 2016-04-14 DIAGNOSIS — M6281 Muscle weakness (generalized): Secondary | ICD-10-CM | POA: Diagnosis not present

## 2016-04-14 DIAGNOSIS — G4733 Obstructive sleep apnea (adult) (pediatric): Secondary | ICD-10-CM

## 2016-04-14 DIAGNOSIS — M25551 Pain in right hip: Secondary | ICD-10-CM | POA: Diagnosis not present

## 2016-04-14 NOTE — Progress Notes (Signed)
PULMONARY OFFICE PROGRESS NOTE  Date of initial consultation: 06/28/15 Reason for consultation: Dyspnea, history of COPD (previously managed by Dr Lake Bells)   PROBLEMS: COPD - Mild obstruction by PFTs 07/2015 OSA - CPAP initiated 07/2015 PLMD  Initial HPI (06/27/05):  59 M with history of COPD, previously managed by Dr Lake Bells. Referred by Dr Rockey Situ for re-evaluation of chronic exertional dyspnea. Pt reports progressive dyspnea over the past year. He has occasional chest tightness. He has chronic cough with occasional morning mucus. He has been prescribed Symbicort which he is using as a rescue inhaler and albuterol which he doesn't use at all. He is also noted to be on amiodarone. In addition, he reports that his wife has noted frequent nocturnal apneas. His sleep quality is poor whic he attributes to arthritis pain. He has moderate daytime sleepiness but has never fallen asleep at critical times such as while driving. IMP/PLAN (06/27/05): COPD, unspecified; Probable OSA. Counseled re: proper use of his inhaler medications. Symbicort is to be 2 inhalations BID every day and albuterol MDI is to be used as a rescue inhaler. A polysomnogram has been ordered to investigate likely OSA. 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 Probable mild obstruction (FEV1 70% pred) with gas trapping (RV 134% pred) and mild-mod decrease in DLCO PSG (07/25/15): Overall AHI < 10/hr but > 100/hr in supine position. ROV (07/30/15): Dyspnea on exertion is moderately improved with scheduled use of Symbicort. He is also on Spiriva, PRN albuterol with rare use of albuterol MDI. PSG results not available. Continue same COPD regimen CPAP titration (08/29/15): recommend full mask with CPAP 8 cm H2O ROV 12/19/15: bradycardia noted. Discussed with Dr Rockey Situ and metoprolol dose reduced. No changes made to pulmonary medication regimen  Events since  last visit: Placement of PPM by Dr Caryl Comes 03/03/16 Fall from ladder with severe back pain.  SUBJ (11/09/15): NSC in respiratory symptoms. Uses albuterol rescue MDI 0-1 times per day. Denies CP, fever, purulent sputum, hemoptysis, LE edema and calf tenderness. Compliant with CPAP and feels that sleep quality has improved. Events as above. Has appointment to see surgeon re: back later this day.    Current Outpatient Prescriptions:  .  amiodarone (PACERONE) 200 MG tablet, Take 1 tablet (200 mg total) by mouth daily., Disp: 30 tablet, Rfl: 3 .  amLODipine (NORVASC) 5 MG tablet, Take 1 tablet (5 mg total) by mouth daily., Disp: , Rfl:  .  apixaban (ELIQUIS) 5 MG TABS tablet, Take 1 tablet (5 mg total) by mouth 2 (two) times daily., Disp: 60 tablet, Rfl: 6 .  atorvastatin (LIPITOR) 40 MG tablet, Take 40 mg by mouth at bedtime., Disp: , Rfl:  .  budesonide-formoterol (SYMBICORT) 160-4.5 MCG/ACT inhaler, Inhale 2 puffs into the lungs 2 (two) times daily., Disp: , Rfl:  .  Coenzyme Q10 (CO Q-10) 100 MG CAPS, Take 100 mg by mouth at bedtime. , Disp: , Rfl:  .  gabapentin (NEURONTIN) 300 MG capsule, Take by mouth., Disp: , Rfl:  .  isosorbide mononitrate (IMDUR) 30 MG 24 hr tablet, Take 1 tablet (30 mg total) by mouth daily., Disp: 30 tablet, Rfl: 6 .  metoprolol tartrate (LOPRESSOR) 25 MG tablet, Take 1 tablet (25 mg total) by mouth 2 (two) times daily., Disp: , Rfl:  .  Multiple Vitamin (MULTIVITAMIN WITH MINERALS) TABS tablet, Take 1 tablet by mouth daily., Disp: , Rfl:  .  nitroGLYCERIN (NITROSTAT) 0.4  MG SL tablet, Place 1 tablet (0.4 mg total) under the tongue every 5 (five) minutes as needed for chest pain., Disp: 25 tablet, Rfl: 6 .  Oxycodone HCl 10 MG TABS, Take 20 mg by mouth every 4 (four) hours., Disp: , Rfl:  .  prazosin (MINIPRESS) 2 MG capsule, Take 2 mg by mouth at bedtime., Disp: , Rfl:  .  tiotropium (SPIRIVA) 18 MCG inhalation capsule, Place 18 mcg into inhaler and inhale at bedtime.,  Disp: , Rfl:  .  traMADol (ULTRAM) 50 MG tablet, Take 1 tablet (50 mg total) by mouth every 6 (six) hours as needed for moderate pain., Disp: 90 tablet, Rfl: 0    Vitals:   04/14/16 0901  BP: 130/72  Pulse: 74  SpO2: 96%  Weight: 227 lb 12.8 oz (103.3 kg)  Height: 5\' 9"  (1.753 m)    EXAM:  Gen: WDWN, NAD HEENT: NCAT, oropharynx normal Neck: Supple without JVD Lungs: breath sounds decreased, no wheezes Cardiovascular: Reg, no M noted  Abdomen: Soft, nontender, normal BS Ext: No C/C/E Neuro: no focal deficits noted  DATA:   BMP Latest Ref Rng & Units 04/11/2016 03/01/2016 02/29/2016  Glucose 65 - 99 mg/dL 112(H) 122(H) -  BUN 6 - 20 mg/dL 20 13 -  Creatinine 0.61 - 1.24 mg/dL 0.94 1.03 -  Sodium 135 - 145 mmol/L 139 139 -  Potassium 3.5 - 5.1 mmol/L 4.4 4.2 3.9  Chloride 101 - 111 mmol/L 106 103 -  CO2 22 - 32 mmol/L 30 25 -  Calcium 8.9 - 10.3 mg/dL 9.0 8.9 -    CBC Latest Ref Rng & Units 04/11/2016 03/04/2016 03/03/2016  WBC 3.8 - 10.6 K/uL 6.9 6.5 5.2  Hemoglobin 13.0 - 18.0 g/dL 12.3(L) 12.0(L) 13.1  Hematocrit 40.0 - 52.0 % 35.8(L) 37.1(L) 40.3  Platelets 150 - 440 K/uL 164 148(L) 149(L)    IMPRESSION:   Chronic obstructive pulmonary disease - well controlled. OSA - well controlled on CPAP Bradycardia - resolved after placement of PPM  PLAN:  Cont Symbicort, Spiriva and PRN albuterol Continue CPAP as ordered ROV 4 months or sooner as needed   Merton Border, MD PCCM service Mobile [phone] Pager [phone] 04/14/2016

## 2016-04-17 DIAGNOSIS — M25551 Pain in right hip: Secondary | ICD-10-CM | POA: Diagnosis not present

## 2016-04-17 DIAGNOSIS — M25651 Stiffness of right hip, not elsewhere classified: Secondary | ICD-10-CM | POA: Diagnosis not present

## 2016-04-17 DIAGNOSIS — S7001XD Contusion of right hip, subsequent encounter: Secondary | ICD-10-CM | POA: Diagnosis not present

## 2016-04-17 DIAGNOSIS — M5441 Lumbago with sciatica, right side: Secondary | ICD-10-CM | POA: Diagnosis not present

## 2016-04-17 DIAGNOSIS — G4733 Obstructive sleep apnea (adult) (pediatric): Secondary | ICD-10-CM | POA: Diagnosis not present

## 2016-04-21 DIAGNOSIS — M25651 Stiffness of right hip, not elsewhere classified: Secondary | ICD-10-CM | POA: Diagnosis not present

## 2016-04-21 DIAGNOSIS — M25551 Pain in right hip: Secondary | ICD-10-CM | POA: Diagnosis not present

## 2016-04-24 DIAGNOSIS — M25651 Stiffness of right hip, not elsewhere classified: Secondary | ICD-10-CM | POA: Diagnosis not present

## 2016-04-24 DIAGNOSIS — M25551 Pain in right hip: Secondary | ICD-10-CM | POA: Diagnosis not present

## 2016-04-25 DIAGNOSIS — M791 Myalgia: Secondary | ICD-10-CM | POA: Diagnosis not present

## 2016-04-25 DIAGNOSIS — M160 Bilateral primary osteoarthritis of hip: Secondary | ICD-10-CM | POA: Diagnosis not present

## 2016-04-25 DIAGNOSIS — I445 Left posterior fascicular block: Secondary | ICD-10-CM | POA: Diagnosis not present

## 2016-04-25 DIAGNOSIS — I2510 Atherosclerotic heart disease of native coronary artery without angina pectoris: Secondary | ICD-10-CM | POA: Diagnosis not present

## 2016-04-25 DIAGNOSIS — M25551 Pain in right hip: Secondary | ICD-10-CM | POA: Diagnosis not present

## 2016-04-25 DIAGNOSIS — R2689 Other abnormalities of gait and mobility: Secondary | ICD-10-CM | POA: Diagnosis not present

## 2016-04-25 DIAGNOSIS — R918 Other nonspecific abnormal finding of lung field: Secondary | ICD-10-CM | POA: Diagnosis not present

## 2016-04-25 DIAGNOSIS — I509 Heart failure, unspecified: Secondary | ICD-10-CM | POA: Diagnosis not present

## 2016-04-25 DIAGNOSIS — S79911A Unspecified injury of right hip, initial encounter: Secondary | ICD-10-CM | POA: Diagnosis not present

## 2016-04-25 DIAGNOSIS — J449 Chronic obstructive pulmonary disease, unspecified: Secondary | ICD-10-CM | POA: Diagnosis not present

## 2016-04-25 DIAGNOSIS — S322XXA Fracture of coccyx, initial encounter for closed fracture: Secondary | ICD-10-CM | POA: Diagnosis not present

## 2016-04-25 DIAGNOSIS — W132XXA Fall from, out of or through roof, initial encounter: Secondary | ICD-10-CM | POA: Diagnosis not present

## 2016-04-25 DIAGNOSIS — R079 Chest pain, unspecified: Secondary | ICD-10-CM | POA: Diagnosis not present

## 2016-04-25 DIAGNOSIS — S3991XA Unspecified injury of abdomen, initial encounter: Secondary | ICD-10-CM | POA: Diagnosis not present

## 2016-04-25 DIAGNOSIS — R9431 Abnormal electrocardiogram [ECG] [EKG]: Secondary | ICD-10-CM | POA: Diagnosis not present

## 2016-04-25 DIAGNOSIS — Z950 Presence of cardiac pacemaker: Secondary | ICD-10-CM | POA: Diagnosis not present

## 2016-04-25 DIAGNOSIS — Z951 Presence of aortocoronary bypass graft: Secondary | ICD-10-CM | POA: Diagnosis not present

## 2016-04-25 DIAGNOSIS — S7001XA Contusion of right hip, initial encounter: Secondary | ICD-10-CM | POA: Diagnosis not present

## 2016-04-25 DIAGNOSIS — I110 Hypertensive heart disease with heart failure: Secondary | ICD-10-CM | POA: Diagnosis not present

## 2016-04-25 DIAGNOSIS — S300XXA Contusion of lower back and pelvis, initial encounter: Secondary | ICD-10-CM | POA: Diagnosis not present

## 2016-04-25 DIAGNOSIS — M1611 Unilateral primary osteoarthritis, right hip: Secondary | ICD-10-CM | POA: Diagnosis not present

## 2016-04-25 DIAGNOSIS — I4519 Other right bundle-branch block: Secondary | ICD-10-CM | POA: Diagnosis not present

## 2016-04-25 MED ORDER — METOPROLOL TARTRATE 50 MG PO TABS
ORAL_TABLET | ORAL | Status: AC
  Filled 2016-04-25: qty 1

## 2016-04-25 MED ORDER — ENOXAPARIN SODIUM 40 MG/0.4ML ~~LOC~~ SOLN
SUBCUTANEOUS | Status: AC
  Filled 2016-04-25: qty 0.4

## 2016-04-26 DIAGNOSIS — Z951 Presence of aortocoronary bypass graft: Secondary | ICD-10-CM | POA: Diagnosis not present

## 2016-04-26 DIAGNOSIS — M160 Bilateral primary osteoarthritis of hip: Secondary | ICD-10-CM | POA: Diagnosis not present

## 2016-04-26 DIAGNOSIS — W132XXA Fall from, out of or through roof, initial encounter: Secondary | ICD-10-CM | POA: Diagnosis not present

## 2016-04-26 DIAGNOSIS — I110 Hypertensive heart disease with heart failure: Secondary | ICD-10-CM | POA: Diagnosis not present

## 2016-04-26 DIAGNOSIS — R9431 Abnormal electrocardiogram [ECG] [EKG]: Secondary | ICD-10-CM | POA: Diagnosis not present

## 2016-04-26 DIAGNOSIS — I445 Left posterior fascicular block: Secondary | ICD-10-CM | POA: Diagnosis not present

## 2016-04-26 DIAGNOSIS — Z950 Presence of cardiac pacemaker: Secondary | ICD-10-CM | POA: Diagnosis not present

## 2016-04-26 DIAGNOSIS — I509 Heart failure, unspecified: Secondary | ICD-10-CM | POA: Diagnosis not present

## 2016-04-26 DIAGNOSIS — S7001XA Contusion of right hip, initial encounter: Secondary | ICD-10-CM | POA: Diagnosis not present

## 2016-04-26 DIAGNOSIS — S300XXA Contusion of lower back and pelvis, initial encounter: Secondary | ICD-10-CM | POA: Diagnosis not present

## 2016-04-26 DIAGNOSIS — I4519 Other right bundle-branch block: Secondary | ICD-10-CM | POA: Diagnosis not present

## 2016-04-26 DIAGNOSIS — S322XXA Fracture of coccyx, initial encounter for closed fracture: Secondary | ICD-10-CM | POA: Diagnosis not present

## 2016-04-26 DIAGNOSIS — J449 Chronic obstructive pulmonary disease, unspecified: Secondary | ICD-10-CM | POA: Diagnosis not present

## 2016-04-26 DIAGNOSIS — I2510 Atherosclerotic heart disease of native coronary artery without angina pectoris: Secondary | ICD-10-CM | POA: Diagnosis not present

## 2016-04-26 DIAGNOSIS — R918 Other nonspecific abnormal finding of lung field: Secondary | ICD-10-CM | POA: Diagnosis not present

## 2016-04-26 DIAGNOSIS — R079 Chest pain, unspecified: Secondary | ICD-10-CM | POA: Diagnosis not present

## 2016-04-27 DIAGNOSIS — M160 Bilateral primary osteoarthritis of hip: Secondary | ICD-10-CM | POA: Diagnosis not present

## 2016-04-27 DIAGNOSIS — Z951 Presence of aortocoronary bypass graft: Secondary | ICD-10-CM | POA: Diagnosis not present

## 2016-04-27 DIAGNOSIS — Z950 Presence of cardiac pacemaker: Secondary | ICD-10-CM | POA: Diagnosis not present

## 2016-04-27 DIAGNOSIS — S322XXA Fracture of coccyx, initial encounter for closed fracture: Secondary | ICD-10-CM | POA: Diagnosis not present

## 2016-04-27 DIAGNOSIS — S300XXA Contusion of lower back and pelvis, initial encounter: Secondary | ICD-10-CM | POA: Diagnosis not present

## 2016-04-27 DIAGNOSIS — I110 Hypertensive heart disease with heart failure: Secondary | ICD-10-CM | POA: Diagnosis not present

## 2016-04-27 DIAGNOSIS — J449 Chronic obstructive pulmonary disease, unspecified: Secondary | ICD-10-CM | POA: Diagnosis not present

## 2016-04-27 DIAGNOSIS — W132XXA Fall from, out of or through roof, initial encounter: Secondary | ICD-10-CM | POA: Diagnosis not present

## 2016-04-27 DIAGNOSIS — I2510 Atherosclerotic heart disease of native coronary artery without angina pectoris: Secondary | ICD-10-CM | POA: Diagnosis not present

## 2016-04-27 DIAGNOSIS — I509 Heart failure, unspecified: Secondary | ICD-10-CM | POA: Diagnosis not present

## 2016-04-29 ENCOUNTER — Telehealth: Payer: Self-pay | Admitting: Cardiovascular Disease

## 2016-04-29 NOTE — Telephone Encounter (Signed)
Spoke w/ pt.  He reports that he fell off of a ladder on 03/31/16 and sustained a "large hematoma on my ass making it impossible to walk".  He saw PA @ [facility] but had some issues w/ him and went to [facility]. He states that they told him rather than do surgery on him, they would hold his blood thinners. He reports that he is improving and "it's seems to be working".  He was advised to hold his Eliquis & Lipitor and that his cardiologist would need to make the decision when to resume.  He would like to come in to discuss w/ Dr. Rockey Situ and let him take a look at his hip. Pt sched for appt 11/1, but placed on the wait list in the event of a cancellation.  He states this date is good - it will give him more time to heal. Advised him that I will make Dr. Rockey Situ aware and call him if he has any other recommendations before that time.

## 2016-04-29 NOTE — Telephone Encounter (Signed)
Would recommend he stay off anymore ladders Would recommend he'll let us know when hematoma has improved

## 2016-04-29 NOTE — Telephone Encounter (Signed)
Pt states he fell and hurt his hip, and the Dr. In [facility] ED, took him off his Eliquis and Lipitor. He would like to talk with someone regarding this. Please call.

## 2016-04-30 NOTE — Telephone Encounter (Signed)
Called and let patient know that Dr. Rockey Situ was aware and recommended that he call back when hematoma improves and no more ladders. He verbalized understanding and had no further questions at this time. He did confirm his upcoming appointment on 05/21/16 at 07:40AM.

## 2016-05-12 DIAGNOSIS — D485 Neoplasm of uncertain behavior of skin: Secondary | ICD-10-CM | POA: Diagnosis not present

## 2016-05-12 DIAGNOSIS — C44622 Squamous cell carcinoma of skin of right upper limb, including shoulder: Secondary | ICD-10-CM | POA: Diagnosis not present

## 2016-05-12 DIAGNOSIS — X32XXXA Exposure to sunlight, initial encounter: Secondary | ICD-10-CM | POA: Diagnosis not present

## 2016-05-12 DIAGNOSIS — D2261 Melanocytic nevi of right upper limb, including shoulder: Secondary | ICD-10-CM | POA: Diagnosis not present

## 2016-05-12 DIAGNOSIS — D2262 Melanocytic nevi of left upper limb, including shoulder: Secondary | ICD-10-CM | POA: Diagnosis not present

## 2016-05-12 DIAGNOSIS — L821 Other seborrheic keratosis: Secondary | ICD-10-CM | POA: Diagnosis not present

## 2016-05-12 DIAGNOSIS — L570 Actinic keratosis: Secondary | ICD-10-CM | POA: Diagnosis not present

## 2016-05-12 DIAGNOSIS — G4733 Obstructive sleep apnea (adult) (pediatric): Secondary | ICD-10-CM | POA: Diagnosis not present

## 2016-05-12 DIAGNOSIS — D225 Melanocytic nevi of trunk: Secondary | ICD-10-CM | POA: Diagnosis not present

## 2016-05-21 ENCOUNTER — Encounter: Payer: Self-pay | Admitting: Cardiovascular Disease

## 2016-05-21 ENCOUNTER — Ambulatory Visit (INDEPENDENT_AMBULATORY_CARE_PROVIDER_SITE_OTHER): Payer: Medicare HMO | Admitting: Cardiovascular Disease

## 2016-05-21 VITALS — BP 150/68 | HR 68 | Resp 16 | Ht 69.0 in | Wt 228.8 lb

## 2016-05-21 DIAGNOSIS — I25701 Atherosclerosis of coronary artery bypass graft(s), unspecified, with angina pectoris with documented spasm: Secondary | ICD-10-CM | POA: Diagnosis not present

## 2016-05-21 DIAGNOSIS — I4891 Unspecified atrial fibrillation: Secondary | ICD-10-CM

## 2016-05-21 DIAGNOSIS — I483 Typical atrial flutter: Secondary | ICD-10-CM

## 2016-05-21 MED ORDER — ATORVASTATIN CALCIUM 80 MG PO TABS: 80.0000 mg | ORAL_TABLET | Freq: Every day | ORAL | 3 refills | Status: DC

## 2016-05-21 NOTE — Progress Notes (Signed)
Cardiology Office Note  Date:  05/21/2016   ID:  [name], DOB Jan 28, 1946, MRN [id number]  PCP:  [name], MD   Chief Complaint  Patient presents with  . other    Follow up from a fall; fell on hip which caused a hematoma. Meds reviewed by the pt. verbally.     HPI:  [name] is a 70 year old gentleman with a history of smoking, coronary artery disease, cabg, Last catheterization March 2011 with occluded vein graft 2, patent LIMA, collaterals from left to right, atrial fibrillation March 2013, History of sick sinus syndrome, symptomatic bradycardia with recent pacemaker placement 02/2016, who presents for follow-up of his coronary artery disease and atrial fibrillation.  Smoked for 40 years, quit in 2009 history of CLL atrial fibrillation on 10/11/2014. Converted back to normal with metoprolol. Possible conversion pause.  In follow-up today he reports that he fell off a ladder beginning of October 2017 Hurt his tailbone, seen by orthopedics locally, told to start physical therapy Symptoms got worse, and he had to stop physical therapy He went to a [facility] for second opinion Seen in the emergency room, CT at [facility]: 04/25/2016 showing large hematoma Right gluteal intramuscular hematoma  measuring 9.6 x 6.8 x 4.9 cm [facility] stopped eliquis, statin (unclear why they stopped his statin) Has been off the coagulation since that time,  Hematoma has slowly improved, pain dramatically improved Not at his baseline yet  Reports that he needs surgery for skin right wrist for cancer early next week  Pacer download reviewed with him in detail from 02/2016 no mode switches He denies any tachycardia concerning for atrial fibrillation  EKG on today's visit shows paced rhythm 68 bpm, nonspecific ST abnormality  Other past medical history reviewed Working with the [facility] for PTSD, reports feeling well, stable Reports that he is been compliant with his Lasix, taking 80 mg daily which  is higher dose than before for the past 5 days, no improvement in his leg swelling  atrial fibrillation/flutter starting September 12 2015, then persistent flutter In the clinic his heart rate was 126 bpm Amiodarone dosing was increased, amlodipine was changed to diltiazem 120 mg daily, metoprolol increased up to 25 mill grams twice a day Converted to normal sinus rhythm at home  tachycardia concerning for atrial fibrillation 05/10/2015. Lasted only several minutes Rate possibly up to 140 bpm.  Previously has been very active, building a barn, tool shed, taking care of animals some benefits from the [place] for agent orange exposure and his cancer  total cholesterol 137, LDL 86, earlier in the year  chronic leg pain since he was young.  Previous leg pain with Crestor  admitted to the hospital on September 26 2011 for malaise, appearing pale, irregular heart rhythm and noted to be in atrial fibrillation. He was given medication for rhythm control and he converted to normal sinus rhythm later that evening. No longer on anticoagulation. He has been maintaining normal sinus rhythm No symptoms concerning for atrial fibrillation since that time  Echocardiogram showed ejection fraction 35-45%, mildly elevated right ventricular systolic pressures estimated at 30-40 mm mercury.  Previous catheterization showing occluded LAD in the mid vessel, 60% left main, 99% distal RCA who was sent to [facility] 2010 for bypass surgery. He received 3 vessel bypass by Dr.Gerhardt with a LIMA to the LAD, vein graft to the OM1 and vein graft to the PDA, with subsequent cardiac catheter March 2011 for chest discomfort showing occluded vein  grafts x2 with patent LIMA. He has collateral vessels from left to right. Ejection fraction 55% mild anterolateral hypokinesis.  PMH:   has a past medical history of Arthritis; Atrial fibrillation ([place]); Chicken pox; Chronic lymphocytic leukemia ([place]); COPD (chronic  obstructive pulmonary disease) ([place]); Coronary artery disease; Heart murmur; Hypertension; Myocardial infarction (2010); OSA on CPAP; PTSD (post-traumatic stress disorder); Pure hypercholesterolemia; and Rheumatic fever (1959).  PSH:    Past Surgical History:  Procedure Laterality Date  . ABDOMINAL HERNIA REPAIR    . APPENDECTOMY  06/21/1985  . CARDIAC CATHETERIZATION  2010; 2011   ; Dr Fletcher Anon  . CORONARY ARTERY BYPASS GRAFT  04/2009   "CABG X3"  . EP IMPLANTABLE DEVICE N/A 03/03/2016   Procedure: Pacemaker Implant;  Surgeon: Deboraha Sprang, MD;  Location: [place] [facility];  Service: Cardiovascular;  Laterality: N/A;  . FOREIGN BODY REMOVAL  1968   "shrapnel in my tailbone"  . HERNIA REPAIR    . INGUINAL HERNIA REPAIR Right   . LAPAROSCOPIC CHOLECYSTECTOMY    . TONSILLECTOMY AND ADENOIDECTOMY  1956    Current Outpatient Prescriptions  Medication Sig Dispense Refill  . amiodarone (PACERONE) 200 MG tablet Take 1 tablet (200 mg total) by mouth daily. 30 tablet 3  . amLODipine (NORVASC) 5 MG tablet Take 1 tablet (5 mg total) by mouth daily.    Marland Kitchen atorvastatin (LIPITOR) 80 MG tablet Take 1 tablet (80 mg total) by mouth at bedtime. 90 tablet 3  . budesonide-formoterol (SYMBICORT) 160-4.5 MCG/ACT inhaler Inhale 2 puffs into the lungs 2 (two) times daily.    . Coenzyme Q10 (CO Q-10) 100 MG CAPS Take 100 mg by mouth at bedtime.     . gabapentin (NEURONTIN) 300 MG capsule Take by mouth.    . isosorbide mononitrate (IMDUR) 30 MG 24 hr tablet Take 1 tablet (30 mg total) by mouth daily. 30 tablet 6  . metoprolol tartrate (LOPRESSOR) 25 MG tablet Take 1 tablet (25 mg total) by mouth 2 (two) times daily.    . Multiple Vitamin (MULTIVITAMIN WITH MINERALS) TABS tablet Take 1 tablet by mouth daily.    . nitroGLYCERIN (NITROSTAT) 0.4 MG SL tablet Place 1 tablet (0.4 mg total) under the tongue every 5 (five) minutes as needed for chest pain. 25 tablet 6  . Oxycodone HCl 10 MG TABS Take 20 mg by mouth  every 4 (four) hours.    . prazosin (MINIPRESS) 2 MG capsule Take 2 mg by mouth at bedtime.    Marland Kitchen tiotropium (SPIRIVA) 18 MCG inhalation capsule Place 18 mcg into inhaler and inhale at bedtime.    . traMADol (ULTRAM) 50 MG tablet Take 1 tablet (50 mg total) by mouth every 6 (six) hours as needed for moderate pain. 90 tablet 0  . apixaban (ELIQUIS) 5 MG TABS tablet Take 1 tablet (5 mg total) by mouth 2 (two) times daily. (Patient not taking: Reported on 05/21/2016) 60 tablet 6   No current facility-administered medications for this visit.      Allergies:   Review of patient's allergies indicates no known allergies.   Social History:  The patient  reports that he quit smoking about 9 years ago. His smoking use included Cigarettes. He has a 40.00 pack-year smoking history. He has never used smokeless tobacco. He reports that he does not drink alcohol or use drugs.   Family History:   family history includes Heart disease in his mother.    Review of Systems: Review of Systems  Constitutional: Negative.  Respiratory: Negative.   Cardiovascular: Negative.   Gastrointestinal: Negative.   Musculoskeletal: Negative.        Pain in his gluteal area  Neurological: Positive for dizziness.  Psychiatric/Behavioral: Negative.   All other systems reviewed and are negative.    PHYSICAL EXAM: VS:  BP (!) 150/68   Pulse 68   Resp 16   Ht 5\' 9"  (1.753 m)   Wt 228 lb 12 oz (103.8 kg)   BMI 33.78 kg/m  , BMI Body mass index is 33.78 kg/m. GEN: Well nourished, well developed, in no acute distress  HEENT: normal  Neck: no JVD, carotid bruits, or masses Cardiac: RRR; no murmurs, rubs, or gallops,no edema  Respiratory:  clear to auscultation bilaterally, normal work of breathing GI: soft, nontender, nondistended, + BS MS: no deformity or atrophy  Skin: warm and dry, no rash Neuro:  Strength and sensation are intact Psych: euthymic mood, full affect    Recent Labs: 02/26/2016: B Natriuretic  Peptide 248.0; TSH 1.256 03/01/2016: Magnesium 2.2 04/11/2016: ALT 26; BUN 20; Creatinine, Ser 0.94; Hemoglobin 12.3; Platelets 164; Potassium 4.4; Sodium 139    Lipid Panel Lab Results  Component Value Date   CHOL 142 08/23/2015   HDL 33 (L) 08/23/2015   LDLCALC 76 08/23/2015   TRIG 167 (H) 08/23/2015      Wt Readings from Last 3 Encounters:  05/21/16 228 lb 12 oz (103.8 kg)  04/14/16 227 lb 12.8 oz (103.3 kg)  04/11/16 225 lb 3.2 oz (102.2 kg)       ASSESSMENT AND PLAN:  Gluteal hematoma Large hematoma following fall off a ladder, exacerbated by anticoagulation Currently not on his eliquis He has wrist surgery next week for skin cancer. Recommended he wait until gluteal pain has dramatically improved. Pacer interrogation reviewed with him, no recent mode switches.  Atrial fibrillation, unspecified type ([place]) - Plan: EKG 12-Lead Maintaining normal sinus rhythm, We'll continue amiodarone, metoprolol  Anticoagulation on hold possibly for several more weeks given recent gluteal hematoma. 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 Recommended he closely monitor his weight, call our office for worsening ankle swelling  Centrilobular emphysema ([place]) Takes several inhalers, reports his breathing is stable. Has follow-up with pulmonary  Sinus pause  pacemaker placement Has follow-up with Dr. Caryl Comes    Total encounter time more than 25 minutes  Greater than  50% was spent in counseling and coordination of care with the patient   Disposition:   F/U  6 months   Orders Placed This Encounter  Procedures  . EKG 12-Lead     Signed, Esmond Plants, M.D., Ph.D. 05/21/2016  [place], [name]

## 2016-05-21 NOTE — Patient Instructions (Addendum)
Medication Instructions:   Please start aspirin 81 mg daily Please increase the lipitor up to 80 mg daily  Labwork:  No new labs needed  Testing/Procedures:  No further testing at this time   Follow-Up: It was a pleasure seeing you in the office today. Please call us if you have new issues that need to be addressed before your next appt.  [phone]  Your physician wants you to follow-up in: 6 months.  You will receive a reminder letter in the mail two months in advance. If you don't receive a letter, please call our office to schedule the follow-up appointment.  If you need a refill on your cardiac medications before your next appointment, please call your pharmacy.

## 2016-05-22 ENCOUNTER — Encounter: Payer: Self-pay | Admitting: Internal Medicine

## 2016-05-22 ENCOUNTER — Ambulatory Visit (INDEPENDENT_AMBULATORY_CARE_PROVIDER_SITE_OTHER): Payer: Medicare HMO | Admitting: Internal Medicine

## 2016-05-22 VITALS — BP 120/70 | HR 83 | Ht 69.0 in | Wt 228.0 lb

## 2016-05-22 DIAGNOSIS — I480 Paroxysmal atrial fibrillation: Secondary | ICD-10-CM | POA: Diagnosis not present

## 2016-05-22 DIAGNOSIS — R001 Bradycardia, unspecified: Secondary | ICD-10-CM

## 2016-05-22 DIAGNOSIS — Z950 Presence of cardiac pacemaker: Secondary | ICD-10-CM

## 2016-05-22 MED ORDER — APIXABAN 5 MG PO TABS: 5.0000 mg | ORAL_TABLET | Freq: Two times a day (BID) | ORAL | Status: DC

## 2016-05-22 NOTE — Progress Notes (Signed)
Patient Care Team: [name], MD as PCP - General (Internal Medicine) [name], MD as Consulting Physician (Cardiology)   HPI  [name] is a 70 y.o. male Seen in follow-up for atrial fibrillation managed with ELIQUIS and amiodarone. He was seen in August 2017 with significant sinus bradycardia and underwent pacing received an MRI compatible device He also has coronary artery disease with prior bypass surgery  He is vastly improved following pacemaker implantation no further dyspnea on exertion. He's had no dizziness.  He he fell off of a ladder and ended up with a hematoma in his buttocks  Apixoban has been on hold. Most recent CBC 04/1710.3    Records and Results Reviewed  2/17 Myoview scanning EF 50% without ischemia  Past Medical History:  Diagnosis Date  . Arthritis    "qwhere" (02/29/2016)  . Atrial fibrillation ([place])    a. Dx 2013, recurred 02/2014, CHA2DS2VASc = 3 -->placed on Eliquis;  b. 02/2014 Echo: EF 50-55%, mid and apical anterior septum and mid and apical inf septum are abnl, mild to mod Ao sclerosis w/o AS.  Marland Kitchen Chicken pox   . Chronic lymphocytic leukemia ([place])    a. Dx 02/2014.  Marland Kitchen COPD (chronic obstructive pulmonary disease) ([place])   . Coronary artery disease    a. 04/2009 CABG x 3 (LIMA->LAD, VG->OM1, VG->PDA);  b. 09/2009 Cath: occluded VG x 2 w/ patent LIMA and L->R collats. EF 55%, mild antlat HK;  c. 10/2011 MV: EF 53%, no isch/infarct-->low risk.  Marland Kitchen Heart murmur    "since I was 70 years old"  . Hypertension   . Myocardial infarction 2010  . OSA on CPAP   . PTSD (post-traumatic stress disorder)   . Pure hypercholesterolemia   . Rheumatic fever 1959    Past Surgical History:  Procedure Laterality Date  . ABDOMINAL HERNIA REPAIR    . APPENDECTOMY  06/21/1985  . CARDIAC CATHETERIZATION  2010; 2011   ; Dr Fletcher Anon  . CORONARY ARTERY BYPASS GRAFT  04/2009   "CABG X3"  . EP IMPLANTABLE DEVICE N/A 03/03/2016   Procedure: Pacemaker  Implant;  Surgeon: Deboraha Sprang, MD;  Location: [place] [facility];  Service: Cardiovascular;  Laterality: N/A;  . FOREIGN BODY REMOVAL  1968   "shrapnel in my tailbone"  . HERNIA REPAIR    . INGUINAL HERNIA REPAIR Right   . LAPAROSCOPIC CHOLECYSTECTOMY    . TONSILLECTOMY AND ADENOIDECTOMY  1956    Current Outpatient Prescriptions  Medication Sig Dispense Refill  . amiodarone (PACERONE) 200 MG tablet Take 1 tablet (200 mg total) by mouth daily. 30 tablet 3  . amLODipine (NORVASC) 5 MG tablet Take 1 tablet (5 mg total) by mouth daily.    Marland Kitchen atorvastatin (LIPITOR) 80 MG tablet Take 1 tablet (80 mg total) by mouth at bedtime. 90 tablet 3  . budesonide-formoterol (SYMBICORT) 160-4.5 MCG/ACT inhaler Inhale 2 puffs into the lungs 2 (two) times daily.    . Coenzyme Q10 (CO Q-10) 100 MG CAPS Take 100 mg by mouth at bedtime.     . gabapentin (NEURONTIN) 300 MG capsule Take by mouth.    . isosorbide mononitrate (IMDUR) 30 MG 24 hr tablet Take 1 tablet (30 mg total) by mouth daily. 30 tablet 6  . metoprolol tartrate (LOPRESSOR) 25 MG tablet Take 1 tablet (25 mg total) by mouth 2 (two) times daily.    . Multiple Vitamin (MULTIVITAMIN WITH MINERALS) TABS tablet Take 1 tablet  by mouth daily.    . nitroGLYCERIN (NITROSTAT) 0.4 MG SL tablet Place 1 tablet (0.4 mg total) under the tongue every 5 (five) minutes as needed for chest pain. 25 tablet 6  . Oxycodone HCl 10 MG TABS Take 20 mg by mouth every 4 (four) hours.    . prazosin (MINIPRESS) 2 MG capsule Take 2 mg by mouth at bedtime.    Marland Kitchen tiotropium (SPIRIVA) 18 MCG inhalation capsule Place 18 mcg into inhaler and inhale at bedtime.    . traMADol (ULTRAM) 50 MG tablet Take 1 tablet (50 mg total) by mouth every 6 (six) hours as needed for moderate pain. 90 tablet 0   No current facility-administered medications for this visit.     No Known Allergies    Review of Systems negative except from HPI and PMH  Physical Exam BP 120/70   Pulse 83   Ht  5\' 9"  (1.753 m)   Wt 228 lb (103.4 kg)   SpO2 97%   BMI 33.67 kg/m  Well developed and well nourished in no acute distress HENT normal E scleral and icterus clear Neck Supple JVP flat; carotids brisk and full Clear to ausculation Device pocket well healed; without hematoma or erythema.  There is no tethering *Regular rate and rhythm, no murmurs gallops or rub Soft with active bowel sounds No clubbing cyanosis tr Edema Alert and oriented, grossly normal motor and sensory function Skin Warm and Dry    Assessment and  Plan   sinus node dysfunction   Paroxysmal atrial fibrillation  Pacemaker-Medtronic  Hypertension  Device pacemaker function is normal. 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 We will have him bring to his apixoban a week thereafter  Blood pressure well controlled     Current medicines are reviewed at length with the patient today .  The patient does not  have concerns regarding medicines.

## 2016-05-22 NOTE — Patient Instructions (Signed)
Medication Instructions: - Resume your eliquis on Monday 06/02/16 - no other change to your medications  Labwork: - none ordered  Procedures/Testing: - none ordered  Follow-Up: - Remote monitoring is used to monitor your Pacemaker of ICD from home. This monitoring reduces the number of office visits required to check your device to one time per year. It allows [place] to keep an eye on the functioning of your device to ensure it is working properly. You are scheduled for a device check from home on 08/21/16. You may send your transmission at any time that day. If you have a wireless device, the transmission will be sent automatically. After your physician reviews your transmission, you will receive a postcard with your next transmission date.  - Your physician wants you to follow-up in: August 2018 You will receive a reminder letter in the mail two months in advance. If you don't receive a letter, please call our office to schedule the follow-up appointment.  Any Additional Special Instructions Will Be Listed Below (If Applicable).     If you need a refill on your cardiac medications before your next appointment, please call your pharmacy.

## 2016-05-26 DIAGNOSIS — L905 Scar conditions and fibrosis of skin: Secondary | ICD-10-CM | POA: Diagnosis not present

## 2016-05-26 DIAGNOSIS — C44622 Squamous cell carcinoma of skin of right upper limb, including shoulder: Secondary | ICD-10-CM | POA: Diagnosis not present

## 2016-05-27 DIAGNOSIS — L908 Other atrophic disorders of skin: Secondary | ICD-10-CM | POA: Diagnosis not present

## 2016-05-27 DIAGNOSIS — C44622 Squamous cell carcinoma of skin of right upper limb, including shoulder: Secondary | ICD-10-CM | POA: Diagnosis not present

## 2016-05-27 DIAGNOSIS — L814 Other melanin hyperpigmentation: Secondary | ICD-10-CM | POA: Diagnosis not present

## 2016-05-27 DIAGNOSIS — L578 Other skin changes due to chronic exposure to nonionizing radiation: Secondary | ICD-10-CM | POA: Diagnosis not present

## 2016-06-12 DIAGNOSIS — G4733 Obstructive sleep apnea (adult) (pediatric): Secondary | ICD-10-CM | POA: Diagnosis not present

## 2016-06-24 ENCOUNTER — Ambulatory Visit: Payer: Medicare HMO | Admitting: Cardiovascular Disease

## 2016-06-30 ENCOUNTER — Telehealth: Payer: Self-pay | Admitting: Cardiovascular Disease

## 2016-06-30 MED ORDER — AMLODIPINE BESYLATE 5 MG PO TABS: 5.0000 mg | ORAL_TABLET | Freq: Every day | ORAL | 3 refills | Status: DC

## 2016-06-30 MED ORDER — APIXABAN 5 MG PO TABS: 5.0000 mg | ORAL_TABLET | Freq: Two times a day (BID) | ORAL | 3 refills | Status: DC

## 2016-06-30 MED ORDER — ATORVASTATIN CALCIUM 80 MG PO TABS: 80.0000 mg | ORAL_TABLET | Freq: Every day | ORAL | 3 refills | Status: DC

## 2016-06-30 MED ORDER — AMIODARONE HCL 200 MG PO TABS: 200.0000 mg | ORAL_TABLET | Freq: Every day | ORAL | 3 refills | Status: DC

## 2016-06-30 MED ORDER — METOPROLOL TARTRATE 25 MG PO TABS: 25.0000 mg | ORAL_TABLET | Freq: Two times a day (BID) | ORAL | 3 refills | Status: DC

## 2016-06-30 NOTE — Telephone Encounter (Signed)
Pt calling stating he needs written prescriptions   So he can take them to the [place] a call back stating he needs to go to the [place]   Please advise

## 2016-06-30 NOTE — Telephone Encounter (Signed)
Spoke with [name] on which medications he needs refilling: The patient needs a written Rx for all cardiac medications for 90 day supply with 3 refills to take to the [place]. Patient contacted the Rx's are ready to pick up.

## 2016-07-08 ENCOUNTER — Encounter: Payer: Self-pay | Admitting: Family Medicine

## 2016-07-08 ENCOUNTER — Encounter: Payer: Medicare Other | Admitting: Internal Medicine

## 2016-07-08 ENCOUNTER — Ambulatory Visit (INDEPENDENT_AMBULATORY_CARE_PROVIDER_SITE_OTHER): Payer: Medicare HMO | Admitting: Family Medicine

## 2016-07-08 VITALS — BP 106/68 | HR 80 | Temp 97.4°F | Resp 14 | Ht 68.5 in | Wt 226.2 lb

## 2016-07-08 DIAGNOSIS — M160 Bilateral primary osteoarthritis of hip: Secondary | ICD-10-CM | POA: Diagnosis not present

## 2016-07-08 DIAGNOSIS — Z0000 Encounter for general adult medical examination without abnormal findings: Secondary | ICD-10-CM | POA: Diagnosis not present

## 2016-07-08 DIAGNOSIS — I495 Sick sinus syndrome: Secondary | ICD-10-CM | POA: Insufficient documentation

## 2016-07-08 DIAGNOSIS — M1612 Unilateral primary osteoarthritis, left hip: Secondary | ICD-10-CM | POA: Insufficient documentation

## 2016-07-08 DIAGNOSIS — Z7189 Other specified counseling: Secondary | ICD-10-CM | POA: Insufficient documentation

## 2016-07-08 NOTE — Patient Instructions (Signed)
Continue your meds.  We will arrange your referral to Dr. Marry Guan.  Follow up in 6 months.  Take care  Dr. Lacinda Axon   Health Maintenance, Male A healthy lifestyle and preventative care can promote health and wellness.  Maintain regular health, dental, and eye exams.  Eat a healthy diet. Foods like vegetables, fruits, whole grains, low-fat dairy products, and lean protein foods contain the nutrients you need and are low in calories. Decrease your intake of foods high in solid fats, added sugars, and salt. Get information about a proper diet from your health care provider, if necessary.  Regular physical exercise is one of the most important things you can do for your health. Most adults should get at least 150 minutes of moderate-intensity exercise (any activity that increases your heart rate and causes you to sweat) each week. In addition, most adults need muscle-strengthening exercises on 2 or more days a week.   Maintain a healthy weight. The body mass index (BMI) is a screening tool to identify possible weight problems. It provides an estimate of body fat based on height and weight. Your health care provider can find your BMI and can help you achieve or maintain a healthy weight. For males 20 years and older:  A BMI below 18.5 is considered underweight.  A BMI of 18.5 to 24.9 is normal.  A BMI of 25 to 29.9 is considered overweight.  A BMI of 30 and above is considered obese.  Maintain normal blood lipids and cholesterol by exercising and minimizing your intake of saturated fat. Eat a balanced diet with plenty of fruits and vegetables. Blood tests for lipids and cholesterol should begin at age 63 and be repeated every 5 years. If your lipid or cholesterol levels are high, you are over age 30, or you are at high risk for heart disease, you may need your cholesterol levels checked more frequently.Ongoing high lipid and cholesterol levels should be treated with medicines if diet and  exercise are not working.  If you smoke, find out from your health care provider how to quit. If you do not use tobacco, do not start.  Lung cancer screening is recommended for adults aged 80-80 years who are at high risk for developing lung cancer because of a history of smoking. A yearly low-dose CT scan of the lungs is recommended for people who have at least a 30-pack-year history of smoking and are current smokers or have quit within the past 15 years. A pack year of smoking is smoking an average of 1 pack of cigarettes a day for 1 year (for example, a 30-pack-year history of smoking could mean smoking 1 pack a day for 30 years or 2 packs a day for 15 years). Yearly screening should continue until the smoker has stopped smoking for at least 15 years. Yearly screening should be stopped for people who develop a health problem that would prevent them from having lung cancer treatment.  If you choose to drink alcohol, do not have more than 2 drinks per day. One drink is considered to be 12 oz (360 mL) of beer, 5 oz (150 mL) of wine, or 1.5 oz (45 mL) of liquor.  Avoid the use of street drugs. Do not share needles with anyone. Ask for help if you need support or instructions about stopping the use of drugs.  High blood pressure causes heart disease and increases the risk of stroke. High blood pressure is more likely to develop in:  People who have  blood pressure in the end of the normal range (100-139/85-89 mm Hg).  People who are overweight or obese.  People who are African American.  If you are 45-9 years of age, have your blood pressure checked every 3-5 years. If you are 22 years of age or older, have your blood pressure checked every year. You should have your blood pressure measured twice-once when you are at a hospital or clinic, and once when you are not at a hospital or clinic. Record the average of the two measurements. To check your blood pressure when you are not at a hospital or  clinic, you can use:  An automated blood pressure machine at a pharmacy.  A home blood pressure monitor.  If you are 58-70 years old, ask your health care provider if you should take aspirin to prevent heart disease.  Diabetes screening involves taking a blood sample to check your fasting blood sugar level. This should be done once every 3 years after age 41 if you are at a normal weight and without risk factors for diabetes. Testing should be considered at a younger age or be carried out more frequently if you are overweight and have at least 1 risk factor for diabetes.  Colorectal cancer can be detected and often prevented. Most routine colorectal cancer screening begins at the age of 25 and continues through age 92. However, your health care provider may recommend screening at an earlier age if you have risk factors for colon cancer. On a yearly basis, your health care provider may provide home test kits to check for hidden blood in the stool. A small camera at the end of a tube may be used to directly examine the colon (sigmoidoscopy or colonoscopy) to detect the earliest forms of colorectal cancer. Talk to your health care provider about this at age 46 when routine screening begins. A direct exam of the colon should be repeated every 5-10 years through age 28, unless early forms of precancerous polyps or small growths are found.  People who are at an increased risk for hepatitis B should be screened for this virus. You are considered at high risk for hepatitis B if:  You were born in a country where hepatitis B occurs often. Talk with your health care provider about which countries are considered high risk.  Your parents were born in a high-risk country and you have not received a shot to protect against hepatitis B (hepatitis B vaccine).  You have HIV or AIDS.  You use needles to inject street drugs.  You live with, or have sex with, someone who has hepatitis B.  You are a man who has  sex with other men (MSM).  You get hemodialysis treatment.  You take certain medicines for conditions like cancer, organ transplantation, and autoimmune conditions.  Hepatitis C blood testing is recommended for all people born from [birth year] through 1966 and any individual with known risk factors for hepatitis C.  Healthy men should no longer receive prostate-specific antigen (PSA) blood tests as part of routine cancer screening. Talk to your health care provider about prostate cancer screening.  Testicular cancer screening is not recommended for adolescents or adult males who have no symptoms. Screening includes self-exam, a health care provider exam, and other screening tests. Consult with your health care provider about any symptoms you have or any concerns you have about testicular cancer.  Practice safe sex. Use condoms and avoid high-risk sexual practices to reduce the spread of sexually transmitted infections (  STIs).  You should be screened for STIs, including gonorrhea and chlamydia if:  You are sexually active and are younger than 24 years.  You are older than 24 years, and your health care provider tells you that you are at risk for this type of infection.  Your sexual activity has changed since you were last screened, and you are at an increased risk for chlamydia or gonorrhea. Ask your health care provider if you are at risk.  If you are at risk of being infected with HIV, it is recommended that you take a prescription medicine daily to prevent HIV infection. This is called pre-exposure prophylaxis (PrEP). You are considered at risk if:  You are a man who has sex with other men (MSM).  You are a heterosexual man who is sexually active with multiple partners.  You take drugs by injection.  You are sexually active with a partner who has HIV.  Talk with your health care provider about whether you are at high risk of being infected with HIV. If you choose to begin PrEP, you should  first be tested for HIV. You should then be tested every 3 months for as long as you are taking PrEP.  Use sunscreen. Apply sunscreen liberally and repeatedly throughout the day. You should seek shade when your shadow is shorter than you. Protect yourself by wearing long sleeves, pants, a wide-brimmed hat, and sunglasses year round whenever you are outdoors.  Tell your health care provider of new moles or changes in moles, especially if there is a change in shape or color. Also, tell your health care provider if a mole is larger than the size of a pencil eraser.  A one-time screening for abdominal aortic aneurysm (AAA) and surgical repair of large AAAs by ultrasound is recommended for men aged 19-75 years who are current or former smokers.  Stay current with your vaccines (immunizations). This information is not intended to replace advice given to you by your health care provider. Make sure you discuss any questions you have with your health care provider. Document Released: 01/03/2008 Document Revised: 07/28/2014 Document Reviewed: 04/10/2015 Elsevier Interactive Patient Education  2017 [employer].

## 2016-07-08 NOTE — Progress Notes (Signed)
Pre visit review using our clinic review tool, if applicable. No additional management support is needed unless otherwise documented below in the visit note. 

## 2016-07-08 NOTE — Progress Notes (Signed)
Subjective:  Patient ID: [name], male    DOB: 10-23-1945  Age: 70 y.o. MRN: [id number]  CC: Physical, Hip pain  HPI [name] is a 70 y.o. male with an extensive past medical history, sick sinus syndrome status post pacemaker, PTSD, atrial fibrillation, PAD, hypertension, hyperlipidemia, COPD, CLL, chronic diastolic heart failure, CAD status post CABG presents for an annual physical. Has complaint of hip pain today.  Preventative Healthcare  Colonoscopy: Up to date.   Immunizations  Tetanus - Up to date.   Pneumococcal - Up to date.   Flu - Up to date.  Zoster - Will discuss today.  Hepatitis C screening - Candidate for.   Labs: Up to date.   Alcohol use: No.   Smoking/tobacco use: Former.   Hip pain, R > L  New problem to me.  Chronic with recent worsening.  Recent CT revealed severe osteoarthritis of both hips, right greater than left.  Currently using tramadol and gabapentin for pain.  Was told that he likely needs hip replacement.  He would like to discuss obtaining a referral today.  PMH, Surgical Hx, Family Hx, Social History reviewed and updated as below.  Past Medical History:  Diagnosis Date  . Atrial fibrillation ([place])    a. Dx 2013, recurred 02/2014, CHA2DS2VASc = 3 -->placed on Eliquis;  b. 02/2014 Echo: EF 50-55%, mid and apical anterior septum and mid and apical inf septum are abnl, mild to mod Ao sclerosis w/o AS.  Marland Kitchen Chicken pox   . Chronic lymphocytic leukemia ([place])    a. Dx 02/2014.  Marland Kitchen COPD (chronic obstructive pulmonary disease) ([place])   . Coronary artery disease    a. 04/2009 CABG x 3 (LIMA->LAD, VG->OM1, VG->PDA);  b. 09/2009 Cath: occluded VG x 2 w/ patent LIMA and L->R collats. EF 55%, mild antlat HK;  c. 10/2011 MV: EF 53%, no isch/infarct-->low risk.  Marland Kitchen Hypertension   . Myocardial infarction 2010  . OSA on CPAP   . PTSD (post-traumatic stress disorder)   . Pure hypercholesterolemia   . Rheumatic fever 1959   Past Surgical  History:  Procedure Laterality Date  . ABDOMINAL HERNIA REPAIR    . APPENDECTOMY  06/21/1985  . CARDIAC CATHETERIZATION  2010; 2011   ; Dr Fletcher Anon  . CORONARY ARTERY BYPASS GRAFT  04/2009   "CABG X3"  . EP IMPLANTABLE DEVICE N/A 03/03/2016   Procedure: Pacemaker Implant;  Surgeon: Deboraha Sprang, MD;  Location: [facility];  Service: Cardiovascular;  Laterality: N/A;  . FOREIGN BODY REMOVAL  1968   "shrapnel in my tailbone"  . INGUINAL HERNIA REPAIR Right   . LAPAROSCOPIC CHOLECYSTECTOMY    . TONSILLECTOMY AND ADENOIDECTOMY  1956   Family History  Problem Relation Age of Onset  . Heart disease Mother   . Coronary artery disease      family history   Social History  Substance Use Topics  . Smoking status: Former Smoker    Packs/day: 1.00    Years: 40.00    Types: Cigarettes    Quit date: 07/21/2006  . Smokeless tobacco: Never Used  . Alcohol use No    Review of Systems  HENT: Positive for hearing loss.   Respiratory: Positive for shortness of breath.   Cardiovascular: Positive for leg swelling.  Musculoskeletal: Positive for arthralgias and myalgias.  Neurological: Positive for dizziness, weakness and numbness.  Psychiatric/Behavioral:       Stress.   All other systems reviewed and are  negative.   Objective:   Today's Vitals: BP 106/68 (BP Location: Left Arm, Patient Position: Sitting, Cuff Size: Large)   Pulse 80   Temp 97.4 F (36.3 C) (Oral)   Resp 14   Ht 5' 8.5" (1.74 m) Comment: with shoes  Wt 226 lb 4 oz (102.6 kg)   SpO2 95%   BMI 33.90 kg/m   Physical Exam  Constitutional: He is oriented to person, place, and time. He appears well-developed. No distress.  HENT:  Head: Normocephalic and atraumatic.  Eyes: Conjunctivae are normal.  Neck: Neck supple.  Cardiovascular: Normal rate and regular rhythm.   Pulmonary/Chest: Effort normal and breath sounds normal.  Abdominal: Soft. He exhibits no distension. There is no tenderness.  Musculoskeletal:    Decreased ROM of Right hip.  Neurological: He is alert and oriented to person, place, and time.  Skin: No rash noted.  Psychiatric: He has a normal mood and affect.  Vitals reviewed.  Assessment & Plan:   Problem List Items Addressed This Visit    Hip osteoarthritis    New problem. Severe and impacting function. Continue Tramadol and Gabapentin. Referring to Ortho, Dr. Marry Guan (patient request).      Relevant Orders   Ambulatory referral to Orthopedic Surgery   Annual physical exam - Primary    Colonoscopy up-to-date. Immunizations up-to-date excluding herpes zoster. Discussed Zostavax versus awaiting new vaccine. Patient elected to wait for new vaccine which was just released.          Outpatient Encounter Prescriptions as of 07/08/2016  Medication Sig  . amiodarone (PACERONE) 200 MG tablet Take 1 tablet (200 mg total) by mouth daily.  Marland Kitchen amLODipine (NORVASC) 5 MG tablet Take 1 tablet (5 mg total) by mouth daily.  Marland Kitchen apixaban (ELIQUIS) 5 MG TABS tablet Take 1 tablet (5 mg total) by mouth 2 (two) times daily.  Marland Kitchen atorvastatin (LIPITOR) 80 MG tablet Take 1 tablet (80 mg total) by mouth at bedtime.  . budesonide-formoterol (SYMBICORT) 160-4.5 MCG/ACT inhaler Inhale 2 puffs into the lungs 2 (two) times daily.  . Coenzyme Q10 (CO Q-10) 100 MG CAPS Take 100 mg by mouth at bedtime.   . isosorbide mononitrate (IMDUR) 30 MG 24 hr tablet Take 1 tablet (30 mg total) by mouth daily.  . metoprolol tartrate (LOPRESSOR) 25 MG tablet Take 1 tablet (25 mg total) by mouth 2 (two) times daily.  . Multiple Vitamin (MULTIVITAMIN WITH MINERALS) TABS tablet Take 1 tablet by mouth daily.  . nitroGLYCERIN (NITROSTAT) 0.4 MG SL tablet Place 1 tablet (0.4 mg total) under the tongue every 5 (five) minutes as needed for chest pain.  . prazosin (MINIPRESS) 2 MG capsule Take 2 mg by mouth at bedtime.  Marland Kitchen tiotropium (SPIRIVA) 18 MCG inhalation capsule Place 18 mcg into inhaler and inhale at bedtime.  .  traMADol (ULTRAM) 50 MG tablet Take 1 tablet (50 mg total) by mouth every 6 (six) hours as needed for moderate pain.  Marland Kitchen gabapentin (NEURONTIN) 300 MG capsule Take by mouth.  . [DISCONTINUED] Oxycodone HCl 10 MG TABS Take 20 mg by mouth every 4 (four) hours.   No facility-administered encounter medications on file as of 07/08/2016.     Follow-up: Return in about 6 months (around 01/06/2017).  [place]

## 2016-07-08 NOTE — Assessment & Plan Note (Signed)
Colonoscopy up-to-date. Immunizations up-to-date excluding herpes zoster. Discussed Zostavax versus awaiting new vaccine. Patient elected to wait for new vaccine which was just released.

## 2016-07-08 NOTE — Assessment & Plan Note (Signed)
New problem. Severe and impacting function. Continue Tramadol and Gabapentin. Referring to Ortho, Dr. Marry Guan (patient request).

## 2016-07-12 DIAGNOSIS — G4733 Obstructive sleep apnea (adult) (pediatric): Secondary | ICD-10-CM | POA: Diagnosis not present

## 2016-07-21 HISTORY — PX: JOINT REPLACEMENT: SHX530

## 2016-07-23 DIAGNOSIS — G4733 Obstructive sleep apnea (adult) (pediatric): Secondary | ICD-10-CM | POA: Diagnosis not present

## 2016-08-11 ENCOUNTER — Inpatient Hospital Stay: Payer: Medicare HMO | Attending: Internal Medicine

## 2016-08-11 ENCOUNTER — Inpatient Hospital Stay (HOSPITAL_BASED_OUTPATIENT_CLINIC_OR_DEPARTMENT_OTHER): Payer: Medicare HMO | Admitting: Internal Medicine

## 2016-08-11 DIAGNOSIS — C9110 Chronic lymphocytic leukemia of B-cell type not having achieved remission: Secondary | ICD-10-CM

## 2016-08-11 DIAGNOSIS — I252 Old myocardial infarction: Secondary | ICD-10-CM

## 2016-08-11 DIAGNOSIS — M25552 Pain in left hip: Secondary | ICD-10-CM

## 2016-08-11 DIAGNOSIS — Z87891 Personal history of nicotine dependence: Secondary | ICD-10-CM | POA: Insufficient documentation

## 2016-08-11 DIAGNOSIS — I10 Essential (primary) hypertension: Secondary | ICD-10-CM | POA: Diagnosis not present

## 2016-08-11 DIAGNOSIS — Z79899 Other long term (current) drug therapy: Secondary | ICD-10-CM | POA: Diagnosis not present

## 2016-08-11 DIAGNOSIS — I4891 Unspecified atrial fibrillation: Secondary | ICD-10-CM

## 2016-08-11 DIAGNOSIS — C9111 Chronic lymphocytic leukemia of B-cell type in remission: Secondary | ICD-10-CM

## 2016-08-11 DIAGNOSIS — M25551 Pain in right hip: Secondary | ICD-10-CM | POA: Insufficient documentation

## 2016-08-11 DIAGNOSIS — I2510 Atherosclerotic heart disease of native coronary artery without angina pectoris: Secondary | ICD-10-CM | POA: Insufficient documentation

## 2016-08-11 DIAGNOSIS — J449 Chronic obstructive pulmonary disease, unspecified: Secondary | ICD-10-CM

## 2016-08-11 DIAGNOSIS — F4310 Post-traumatic stress disorder, unspecified: Secondary | ICD-10-CM | POA: Diagnosis not present

## 2016-08-11 DIAGNOSIS — E7800 Pure hypercholesterolemia, unspecified: Secondary | ICD-10-CM | POA: Diagnosis not present

## 2016-08-11 DIAGNOSIS — G4730 Sleep apnea, unspecified: Secondary | ICD-10-CM | POA: Diagnosis not present

## 2016-08-11 DIAGNOSIS — R69 Illness, unspecified: Secondary | ICD-10-CM | POA: Diagnosis not present

## 2016-08-11 LAB — CBC WITH DIFFERENTIAL/PLATELET
Basophils Absolute: 0.1 10*3/uL (ref 0–0.1)
Basophils Relative: 1 %
Eosinophils Absolute: 0.2 10*3/uL (ref 0–0.7)
Eosinophils Relative: 3 %
HEMATOCRIT: 37.7 % — AB (ref 40.0–52.0)
HEMOGLOBIN: 13 g/dL (ref 13.0–18.0)
LYMPHS ABS: 1.9 10*3/uL (ref 1.0–3.6)
Lymphocytes Relative: 24 %
MCH: 32 pg (ref 26.0–34.0)
MCHC: 34.3 g/dL (ref 32.0–36.0)
MCV: 93.1 fL (ref 80.0–100.0)
MONO ABS: 0.9 10*3/uL (ref 0.2–1.0)
Monocytes Relative: 12 %
NEUTROS ABS: 4.9 10*3/uL (ref 1.4–6.5)
NEUTROS PCT: 60 %
Platelets: 193 10*3/uL (ref 150–440)
RBC: 4.05 MIL/uL — ABNORMAL LOW (ref 4.40–5.90)
RDW: 15.2 % — AB (ref 11.5–14.5)
WBC: 8.1 10*3/uL (ref 3.8–10.6)

## 2016-08-11 LAB — LACTATE DEHYDROGENASE: LDH: 135 U/L (ref 98–192)

## 2016-08-11 LAB — COMPREHENSIVE METABOLIC PANEL
ALT: 24 U/L (ref 17–63)
ANION GAP: 5 (ref 5–15)
AST: 19 U/L (ref 15–41)
Albumin: 4.1 g/dL (ref 3.5–5.0)
Alkaline Phosphatase: 102 U/L (ref 38–126)
BILIRUBIN TOTAL: 0.9 mg/dL (ref 0.3–1.2)
BUN: 18 mg/dL (ref 6–20)
CO2: 29 mmol/L (ref 22–32)
Calcium: 8.7 mg/dL — ABNORMAL LOW (ref 8.9–10.3)
Chloride: 106 mmol/L (ref 101–111)
Creatinine, Ser: 0.83 mg/dL (ref 0.61–1.24)
Glucose, Bld: 99 mg/dL (ref 65–99)
Potassium: 4.2 mmol/L (ref 3.5–5.1)
Sodium: 140 mmol/L (ref 135–145)
TOTAL PROTEIN: 7 g/dL (ref 6.5–8.1)

## 2016-08-11 NOTE — Progress Notes (Signed)
Patient here today for follow up.   

## 2016-08-11 NOTE — Progress Notes (Signed)
[place] OFFICE PROGRESS NOTE  Patient Care Team: Coral Spikes, DO as PCP - General (Family Medicine) [name], MD as Consulting Physician (Cardiology)   SUMMARY OF ONCOLOGIC HISTORY:  Oncology History   # AUG 2015- SLL/CLL [facility] [address] Bx] s/p Benda-Rituxan x6 [finished March 2016]; Maintenance Rituxan q 1m [started April 2016; Dr.Pandit];Last Ritux Jan 2017.  MARCH 2017- CT N/C/A/P- NED. STOP Ritux; surveillance    # s/p PPM [Dr.Klein; Sep 2017]; A.fib [on eliquis]     CLL (chronic lymphocytic leukemia) (HCC)    Initial Diagnosis    CLL (chronic lymphocytic leukemia) (HCC)       INTERVAL HISTORY:  A pleasant 71 year old male patient with above history of SLL/CLL currently on surveillance is here for follow-up.   In the interim he had a pacemaker placed. He is currently on the Eliquis. He complains of bilateral hip pain that interferes with his ADL's and is signifacntly impairing his performance status. Patient will see Dr. Marry Guan (Ortho) to have both hips repaired due to a fall. Patient's appetite is good. He denies any weight loss. Denies any profuse night sweats. Denies any frequent infections. Denies any chest pain or cough.  REVIEW OF SYSTEMS:  A complete 10 point review of system is done which is negative except mentioned above/history of present illness.   PAST MEDICAL HISTORY :  Past Medical History:  Diagnosis Date  . Atrial fibrillation ([place])    a. Dx 2013, recurred 02/2014, CHA2DS2VASc = 3 -->placed on Eliquis;  b. 02/2014 Echo: EF 50-55%, mid and apical anterior septum and mid and apical inf septum are abnl, mild to mod Ao sclerosis w/o AS.  Marland Kitchen Chicken pox   . Chronic lymphocytic leukemia ([place])    a. Dx 02/2014.  Marland Kitchen COPD (chronic obstructive pulmonary disease) ([place])   . Coronary artery disease    a. 04/2009 CABG x 3 (LIMA->LAD, VG->OM1, VG->PDA);  b. 09/2009 Cath: occluded VG x 2 w/ patent LIMA and L->R collats. EF 55%, mild antlat HK;  c.  10/2011 MV: EF 53%, no isch/infarct-->low risk.  Marland Kitchen Hypertension   . Myocardial infarction 2010  . OSA on CPAP   . PTSD (post-traumatic stress disorder)   . Pure hypercholesterolemia   . Rheumatic fever 1959    PAST SURGICAL HISTORY :   Past Surgical History:  Procedure Laterality Date  . ABDOMINAL HERNIA REPAIR    . APPENDECTOMY  06/21/1985  . CARDIAC CATHETERIZATION  2010; 2011   ; Dr Fletcher Anon  . CORONARY ARTERY BYPASS GRAFT  04/2009   "CABG X3"  . EP IMPLANTABLE DEVICE N/A 03/03/2016   Procedure: Pacemaker Implant;  Surgeon: Deboraha Sprang, MD;  Location: [facility];  Service: Cardiovascular;  Laterality: N/A;  . FOREIGN BODY REMOVAL  1968   "shrapnel in my tailbone"  . INGUINAL HERNIA REPAIR Right   . LAPAROSCOPIC CHOLECYSTECTOMY    . TONSILLECTOMY AND ADENOIDECTOMY  1956    FAMILY HISTORY :   Family History  Problem Relation Age of Onset  . Heart disease Mother   . Coronary artery disease      family history    SOCIAL HISTORY:   Social History  Substance Use Topics  . Smoking status: Former Smoker    Packs/day: 1.00    Years: 40.00    Types: Cigarettes    Quit date: 07/21/2006  . Smokeless tobacco: Never Used  . Alcohol use No    ALLERGIES:  has No Known Allergies.  MEDICATIONS:  Current Outpatient Prescriptions  Medication Sig Dispense Refill  . albuterol (PROVENTIL HFA;VENTOLIN HFA) 108 (90 Base) MCG/ACT inhaler Inhale into the lungs every 6 (six) hours as needed for wheezing or shortness of breath.    Marland Kitchen amiodarone (PACERONE) 200 MG tablet Take 1 tablet (200 mg total) by mouth daily. 90 tablet 3  . amLODipine (NORVASC) 5 MG tablet Take 1 tablet (5 mg total) by mouth daily. 90 tablet 3  . apixaban (ELIQUIS) 5 MG TABS tablet Take 1 tablet (5 mg total) by mouth 2 (two) times daily. 180 tablet 3  . atorvastatin (LIPITOR) 80 MG tablet Take 1 tablet (80 mg total) by mouth at bedtime. 90 tablet 3  . budesonide-formoterol (SYMBICORT) 160-4.5 MCG/ACT inhaler  Inhale 2 puffs into the lungs 2 (two) times daily.    . cetirizine (ZYRTEC) 10 MG tablet Take 10 mg by mouth daily.    . Coenzyme Q10 (CO Q-10) 100 MG CAPS Take 100 mg by mouth at bedtime.     . isosorbide mononitrate (IMDUR) 30 MG 24 hr tablet Take 1 tablet (30 mg total) by mouth daily. 30 tablet 6  . metoprolol tartrate (LOPRESSOR) 25 MG tablet Take 1 tablet (25 mg total) by mouth 2 (two) times daily. 180 tablet 3  . Multiple Vitamin (MULTIVITAMIN WITH MINERALS) TABS tablet Take 1 tablet by mouth daily.    . nitroGLYCERIN (NITROSTAT) 0.4 MG SL tablet Place 1 tablet (0.4 mg total) under the tongue every 5 (five) minutes as needed for chest pain. 25 tablet 6  . tiotropium (SPIRIVA) 18 MCG inhalation capsule Place 18 mcg into inhaler and inhale at bedtime.    . traMADol (ULTRAM) 50 MG tablet Take 1 tablet (50 mg total) by mouth every 6 (six) hours as needed for moderate pain. 90 tablet 0  . gabapentin (NEURONTIN) 300 MG capsule Take by mouth.     No current facility-administered medications for this visit.     PHYSICAL EXAMINATION: ECOG PERFORMANCE STATUS: 0 - Asymptomatic  BP 130/72 (BP Location: Left Arm, Patient Position: Sitting)   Pulse 60   Temp 99 F (37.2 C) (Tympanic)   Wt 231 lb (104.8 kg)   BMI 34.61 kg/m   Filed Weights   08/11/16 1518  Weight: 231 lb (104.8 kg)    GENERAL: Well-nourished well-developed; Alert, no distress and comfortable. He is with his wife. Walks with a cane.   EYES: no pallor or icterus OROPHARYNX: no thrush or ulceration; dentures.  NECK: supple, no masses felt LYMPH:  no palpable lymphadenopathy in the cervical, axillary or inguinal regions LUNGS: clear to auscultation and  No wheeze or crackles HEART/CVS: regular rate & rhythm and no murmurs; No lower extremity edema ABDOMEN:abdomen soft, non-tender and normal bowel sounds Musculoskeletal:no cyanosis of digits and no clubbing  PSYCH: alert & oriented x 3 with fluent speech NEURO: no focal  motor/sensory deficits SKIN:  no rashes or significant lesions  LABORATORY DATA:  I have reviewed the data as listed    Component Value Date/Time   NA 140 08/11/2016 1405   NA 139 10/11/2014 1800   K 4.2 08/11/2016 1405   K 3.3 (L) 10/11/2014 1800   CL 106 08/11/2016 1405   CL 106 10/11/2014 1800   CO2 29 08/11/2016 1405   CO2 27 10/11/2014 1800   GLUCOSE 99 08/11/2016 1405   GLUCOSE 107 (H) 10/11/2014 1800   BUN 18 08/11/2016 1405   BUN 15 10/11/2014 1800   CREATININE 0.83 08/11/2016 1405  CREATININE 0.89 10/11/2014 1800   CALCIUM 8.7 (L) 08/11/2016 1405   CALCIUM 8.8 (L) 10/11/2014 1800   PROT 7.0 08/11/2016 1405   PROT 6.4 (L) 10/11/2014 1800   ALBUMIN 4.1 08/11/2016 1405   ALBUMIN 4.1 10/11/2014 1800   AST 19 08/11/2016 1405   AST 23 10/11/2014 1800   ALT 24 08/11/2016 1405   ALT 22 10/11/2014 1800   ALKPHOS 102 08/11/2016 1405   ALKPHOS 61 10/11/2014 1800   BILITOT 0.9 08/11/2016 1405   BILITOT 0.9 10/11/2014 1800   GFRNONAA >60 08/11/2016 1405   GFRNONAA >60 10/11/2014 1800   GFRAA >60 08/11/2016 1405   GFRAA >60 10/11/2014 1800    No results found for: SPEP, UPEP  Lab Results  Component Value Date   WBC 8.1 08/11/2016   NEUTROABS 4.9 08/11/2016   HGB 13.0 08/11/2016   HCT 37.7 (L) 08/11/2016   MCV 93.1 08/11/2016   PLT 193 08/11/2016      Chemistry      Component Value Date/Time   NA 140 08/11/2016 1405   NA 139 10/11/2014 1800   K 4.2 08/11/2016 1405   K 3.3 (L) 10/11/2014 1800   CL 106 08/11/2016 1405   CL 106 10/11/2014 1800   CO2 29 08/11/2016 1405   CO2 27 10/11/2014 1800   BUN 18 08/11/2016 1405   BUN 15 10/11/2014 1800   CREATININE 0.83 08/11/2016 1405   CREATININE 0.89 10/11/2014 1800      Component Value Date/Time   CALCIUM 8.7 (L) 08/11/2016 1405   CALCIUM 8.8 (L) 10/11/2014 1800   ALKPHOS 102 08/11/2016 1405   ALKPHOS 61 10/11/2014 1800   AST 19 08/11/2016 1405   AST 23 10/11/2014 1800   ALT 24 08/11/2016 1405   ALT 22  10/11/2014 1800   BILITOT 0.9 08/11/2016 1405   BILITOT 0.9 10/11/2014 1800       ASSESSMENT & PLAN:   CLL (chronic lymphocytic leukemia) (HCC) # CLL/SLL- status post bendamustine rituximab 6 cycles [finished in April 2016]- s/ maintenance rituximab every 2 months until March 2017. CT scan March 2017 shows- no evidence of any lymphadenopathy in the neck/chest /abdomen pelvis; no splenomegaly. CBC-within normal limits. Clinically no evidence of recurrence. Continue surveillance.  # Bilateral Hip pain: From a fall. Followed by Dr. Marry Guan. Will see him next month for surgical consult to have both hips replaced. No contraindication for any surgical procedures from his lymphoma/leukemia standpoint.   # SCC of right forearm-s/p excision. Follow up with [facility] dermatology.   # Recommend follow-up in 6 months/CBC CMP and LDH.  # CC: Dr. Josephine Igo, NP  I personally interviewed and examined the patient. Agreed with the above plan of care. Patient/family questions were answered. Dr.Brahmanday MD    Cammie Sickle, MD 08/12/2016 10:43 AM

## 2016-08-11 NOTE — Assessment & Plan Note (Addendum)
#   CLL/SLL- status post bendamustine rituximab 6 cycles [finished in April 2016]- s/ maintenance rituximab every 2 months until March 2017. CT scan March 2017 shows- no evidence of any lymphadenopathy in the neck/chest /abdomen pelvis; no splenomegaly. CBC-within normal limits. Clinically no evidence of recurrence. Continue surveillance.  # Bilateral Hip pain: From a fall. Followed by Dr. Marry Guan. Will see him next month for surgical consult to have both hips replaced. No contraindication for any surgical procedures from his lymphoma/leukemia standpoint.   # SCC of right forearm-s/p excision. Follow up with [facility] dermatology.   # Recommend follow-up in 6 months/CBC CMP and LDH.  # CC: Dr. Marry Guan

## 2016-08-12 DIAGNOSIS — G4733 Obstructive sleep apnea (adult) (pediatric): Secondary | ICD-10-CM | POA: Diagnosis not present

## 2016-08-21 ENCOUNTER — Encounter: Payer: Medicare HMO | Admitting: *Deleted

## 2016-08-21 ENCOUNTER — Ambulatory Visit (INDEPENDENT_AMBULATORY_CARE_PROVIDER_SITE_OTHER): Payer: Medicare HMO | Admitting: *Deleted

## 2016-08-21 DIAGNOSIS — I495 Sick sinus syndrome: Secondary | ICD-10-CM | POA: Diagnosis not present

## 2016-08-21 DIAGNOSIS — Z950 Presence of cardiac pacemaker: Secondary | ICD-10-CM | POA: Diagnosis not present

## 2016-08-21 LAB — CUP PACEART INCLINIC DEVICE CHECK
Battery Voltage: 3.03 V
Brady Statistic AP VP Percent: 0.06 %
Brady Statistic AS VP Percent: 0 %
Brady Statistic AS VS Percent: 2.28 %
Brady Statistic RV Percent Paced: 0.07 %
Implantable Lead Implant Date: 20170814
Implantable Lead Location: 753859
Implantable Pulse Generator Implant Date: 20170814
Lead Channel Impedance Value: 494 Ohm
Lead Channel Impedance Value: 589 Ohm
Lead Channel Pacing Threshold Amplitude: 1 V
Lead Channel Sensing Intrinsic Amplitude: 21 mV
Lead Channel Setting Pacing Amplitude: 2 V
Lead Channel Setting Sensing Sensitivity: 0.9 mV
MDC IDC LEAD IMPLANT DT: 20170814
MDC IDC LEAD LOCATION: 753860
MDC IDC MSMT BATTERY REMAINING LONGEVITY: 115 mo
MDC IDC MSMT LEADCHNL RA IMPEDANCE VALUE: 323 Ohm
MDC IDC MSMT LEADCHNL RA IMPEDANCE VALUE: 456 Ohm
MDC IDC MSMT LEADCHNL RA PACING THRESHOLD AMPLITUDE: 1 V
MDC IDC MSMT LEADCHNL RA PACING THRESHOLD PULSEWIDTH: 0.4 ms
MDC IDC MSMT LEADCHNL RV PACING THRESHOLD PULSEWIDTH: 0.4 ms
MDC IDC SESS DTM: 20180201122420
MDC IDC SET LEADCHNL RV PACING AMPLITUDE: 2.5 V
MDC IDC SET LEADCHNL RV PACING PULSEWIDTH: 0.4 ms
MDC IDC STAT BRADY AP VS PERCENT: 97.65 %
MDC IDC STAT BRADY RA PERCENT PACED: 97.56 %

## 2016-08-21 NOTE — Progress Notes (Signed)
Pacemaker check in clinic as patient could not get Carelink Smart to transmit successfully. Normal device function. Thresholds, sensing, impedances consistent with previous measurements. Device programmed to maximize longevity. No mode switch or high ventricular rates noted. Device programmed at appropriate safety margins; RA min output 2.0V, RV min output 2.5V. Histogram distribution appropriate for patient activity level. Device programmed to optimize intrinsic conduction. Estimated longevity 9.5 years. Patient enrolled in remote follow-up, [place] monitor ordered as [place] is not compatible with patient's tablet. Patient education completed. Carelink on 11/20/16 and ROV with SK/B in 01/2017.

## 2016-08-26 ENCOUNTER — Other Ambulatory Visit: Payer: Self-pay | Admitting: Internal Medicine

## 2016-09-01 ENCOUNTER — Ambulatory Visit (INDEPENDENT_AMBULATORY_CARE_PROVIDER_SITE_OTHER): Payer: Medicare HMO | Admitting: Internal Medicine

## 2016-09-01 ENCOUNTER — Encounter: Payer: Self-pay | Admitting: Internal Medicine

## 2016-09-01 VITALS — BP 138/78 | HR 73 | Wt 232.0 lb

## 2016-09-01 DIAGNOSIS — G4730 Sleep apnea, unspecified: Secondary | ICD-10-CM | POA: Diagnosis not present

## 2016-09-01 NOTE — Patient Instructions (Signed)
continue inhalers as prescribed Continue CPAP as prescribed  Follow up with Dr Alva Garnet on 3 months

## 2016-09-01 NOTE — Progress Notes (Signed)
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 Referred by Dr Rockey Situ for re-evaluation of chronic exertional dyspnea. Pt reports progressive dyspnea over the past year. He has occasional chest tightness. He has chronic cough with occasional morning mucus. He has been prescribed Symbicort which he is using as a rescue inhaler and albuterol which he doesn't use at all. He is also noted to be on amiodarone. In addition, he reports that his wife has noted frequent nocturnal apneas. His sleep quality is poor whic he attributes to arthritis pain. He has moderate daytime sleepiness but has never fallen asleep at critical times such as while driving. IMP/PLAN (06/27/05): COPD, unspecified; Probable OSA. Counseled re: proper use of his inhaler medications. Symbicort is to be 2 inhalations BID every day and albuterol MDI is to be used as a rescue inhaler. A polysomnogram has been ordered to investigate likely OSA. 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 Probable mild obstruction (FEV1 70% pred) with gas trapping (RV 134% pred) and mild-mod decrease in DLCO PSG (07/25/15): Overall AHI < 10/hr but > 100/hr in supine position. ROV (07/30/15): Dyspnea on exertion is moderately improved with scheduled use of Symbicort. He is also on Spiriva, PRN albuterol with rare use of albuterol MDI. PSG results not available. Continue same COPD regimen CPAP titration (08/29/15): recommend full mask with CPAP 8 cm H2O ROV 12/19/15: bradycardia noted. Discussed with Dr Rockey Situ and metoprolol dose reduced. No changes made to pulmonary medication regimen  Events since  last visit: Placement of PPM by Dr Caryl Comes 03/03/16 Fall from ladder with severe back pain.  SUBJ (09/01/16): Has chronic SOB,  Uses albuterol rescue MDI 0-1 times per day.  Denies CP, fever, purulent sputum, hemoptysis, LE edema and calf tenderness.  Compliant with CPAP and feels that sleep quality has improved.  90% compliance with CPAP 8 cm h20 AHI 5.4, leak 24L No signs of infection at this time Uses symbicoet and spiriva for COPD  Current Outpatient Prescriptions:  .  albuterol (PROVENTIL HFA;VENTOLIN HFA) 108 (90 Base) MCG/ACT inhaler, Inhale into the lungs every 6 (six) hours as needed for wheezing or shortness of breath., Disp: , Rfl:  .  amiodarone (PACERONE) 200 MG tablet, Take 1 tablet (200 mg total) by mouth daily., Disp: 90 tablet, Rfl: 3 .  amLODipine (NORVASC) 5 MG tablet, Take 1 tablet (5 mg total) by mouth daily., Disp: 90 tablet, Rfl: 3 .  apixaban (ELIQUIS) 5 MG TABS tablet, Take 1 tablet (5 mg total) by mouth 2 (two) times daily., Disp: 180 tablet, Rfl: 3 .  atorvastatin (LIPITOR) 80 MG tablet, Take 1 tablet (80 mg total) by mouth at bedtime., Disp: 90 tablet, Rfl: 3 .  budesonide-formoterol (SYMBICORT) 160-4.5 MCG/ACT inhaler, Inhale 2 puffs into the lungs 2 (two) times daily., Disp: , Rfl:  .  cetirizine (ZYRTEC) 10 MG tablet, Take 10 mg by mouth daily., Disp: , Rfl:  .  Coenzyme Q10 (CO Q-10) 100 MG CAPS, Take 100 mg by mouth at bedtime. , Disp: , Rfl:  .  isosorbide mononitrate (IMDUR) 30 MG 24 hr tablet, Take 1 tablet (30 mg total) by mouth daily., Disp: 30  tablet, Rfl: 6 .  metoprolol tartrate (LOPRESSOR) 25 MG tablet, Take 1 tablet (25 mg total) by mouth 2 (two) times daily., Disp: 180 tablet, Rfl: 3 .  Multiple Vitamin (MULTIVITAMIN WITH MINERALS) TABS tablet, Take 1 tablet by mouth daily., Disp: , Rfl:  .  nitroGLYCERIN (NITROSTAT) 0.4 MG SL tablet, Place 1 tablet (0.4 mg total) under the tongue every 5 (five) minutes as needed for chest pain., Disp: 25 tablet, Rfl:  6 .  tiotropium (SPIRIVA) 18 MCG inhalation capsule, Place 18 mcg into inhaler and inhale at bedtime., Disp: , Rfl:  .  traMADol (ULTRAM) 50 MG tablet, Take 1 tablet (50 mg total) by mouth every 6 (six) hours as needed for moderate pain., Disp: 90 tablet, Rfl: 0 .  gabapentin (NEURONTIN) 300 MG capsule, Take by mouth., Disp: , Rfl:     Vitals:   09/01/16 0842  BP: 138/78  Pulse: 73  SpO2: 97%  Weight: 232 lb (105.2 kg)    EXAM:  Gen: WDWN, NAD HEENT: NCAT, oropharynx normal Neck: Supple without JVD Lungs: breath sounds decreased, no wheezes Chest wall surgical scar midsternum Cardiovascular: Reg, no M noted  Abdomen: Soft, nontender, normal BS Ext: No C/C/E Neuro: no focal deficits noted  DATA:   BMP Latest Ref Rng & Units 08/11/2016 04/11/2016 03/01/2016  Glucose 65 - 99 mg/dL 99 112(H) 122(H)  BUN 6 - 20 mg/dL 18 20 13   Creatinine 0.61 - 1.24 mg/dL 0.83 0.94 1.03  Sodium 135 - 145 mmol/L 140 139 139  Potassium 3.5 - 5.1 mmol/L 4.2 4.4 4.2  Chloride 101 - 111 mmol/L 106 106 103  CO2 22 - 32 mmol/L 29 30 25   Calcium 8.9 - 10.3 mg/dL 8.7(L) 9.0 8.9    CBC Latest Ref Rng & Units 08/11/2016 04/11/2016 03/04/2016  WBC 3.8 - 10.6 K/uL 8.1 6.9 6.5  Hemoglobin 13.0 - 18.0 g/dL 13.0 12.3(L) 12.0(L)  Hematocrit 40.0 - 52.0 % 37.7(L) 35.8(L) 37.1(L)  Platelets 150 - 440 K/uL 193 164 148(L)    IMPRESSION:   Chronic obstructive pulmonary disease - well controlled. OSA - well controlled on CPAP of 8cm h20 Bradycardia - resolved after placement of PPM  PLAN:  Cont Symbicort, Spiriva and PRN albuterol Continue CPAP as ordered   Follow up in 3 months  I have personally obtained a history, examined the patient, evaluated Pertinent laboratory and RadioGraphic/imaging results, and  formulated the assessment and plan   The Patient requires high complexity decision making for assessment and support, frequent evaluation and titration of therapies.   Patient satisfied with Plan of  action and management. All questions answered  Corrin Parker, M.D.  [facility]  Medical Director [place] Director [facility] Cardio-Pulmonary Department

## 2016-09-04 DIAGNOSIS — M1611 Unilateral primary osteoarthritis, right hip: Secondary | ICD-10-CM | POA: Diagnosis not present

## 2016-09-04 DIAGNOSIS — M25559 Pain in unspecified hip: Secondary | ICD-10-CM | POA: Diagnosis not present

## 2016-09-15 ENCOUNTER — Telehealth: Payer: Self-pay | Admitting: Cardiovascular Disease

## 2016-09-15 NOTE — Telephone Encounter (Signed)
Received letter from Dr. Marry Guan @ [facility].   [name] is being scheduled for right total hip arthoplasty.  I would appreciate your assistance in optimizing the patient's cardiac condition in anticipation of surgery. Please inform me of any pre/perioperative recommendations and/or contraindications to surgery.  I would like to discontinue his Eliquis 3 days prior to surgery.  Does he need to be bridged with Lovenox until the Eliquis is restarted postoperatively?  Please route recommendations to [facility] at [phone].

## 2016-09-21 NOTE — Telephone Encounter (Signed)
Acceptable risk for surgery, Ok to come off eliquis 3 days prior to surgery No bridge needed  Would document when he restarted eliquis after fall late last year Woud make sure no change in clinical status, chest pain?

## 2016-09-22 NOTE — Telephone Encounter (Signed)
Routed to fax # provided. 

## 2016-09-30 DIAGNOSIS — D225 Melanocytic nevi of trunk: Secondary | ICD-10-CM | POA: Diagnosis not present

## 2016-09-30 DIAGNOSIS — D485 Neoplasm of uncertain behavior of skin: Secondary | ICD-10-CM | POA: Diagnosis not present

## 2016-09-30 DIAGNOSIS — D0462 Carcinoma in situ of skin of left upper limb, including shoulder: Secondary | ICD-10-CM | POA: Diagnosis not present

## 2016-09-30 DIAGNOSIS — X32XXXA Exposure to sunlight, initial encounter: Secondary | ICD-10-CM | POA: Diagnosis not present

## 2016-09-30 DIAGNOSIS — L570 Actinic keratosis: Secondary | ICD-10-CM | POA: Diagnosis not present

## 2016-09-30 DIAGNOSIS — D2262 Melanocytic nevi of left upper limb, including shoulder: Secondary | ICD-10-CM | POA: Diagnosis not present

## 2016-09-30 DIAGNOSIS — L218 Other seborrheic dermatitis: Secondary | ICD-10-CM | POA: Diagnosis not present

## 2016-09-30 DIAGNOSIS — D2261 Melanocytic nevi of right upper limb, including shoulder: Secondary | ICD-10-CM | POA: Diagnosis not present

## 2016-10-14 DIAGNOSIS — E669 Obesity, unspecified: Secondary | ICD-10-CM | POA: Diagnosis not present

## 2016-10-14 DIAGNOSIS — Z0000 Encounter for general adult medical examination without abnormal findings: Secondary | ICD-10-CM | POA: Diagnosis not present

## 2016-10-14 DIAGNOSIS — I2510 Atherosclerotic heart disease of native coronary artery without angina pectoris: Secondary | ICD-10-CM | POA: Diagnosis not present

## 2016-10-14 DIAGNOSIS — I739 Peripheral vascular disease, unspecified: Secondary | ICD-10-CM | POA: Diagnosis not present

## 2016-10-14 DIAGNOSIS — H259 Unspecified age-related cataract: Secondary | ICD-10-CM | POA: Diagnosis not present

## 2016-10-14 DIAGNOSIS — R69 Illness, unspecified: Secondary | ICD-10-CM | POA: Diagnosis not present

## 2016-10-14 DIAGNOSIS — E7800 Pure hypercholesterolemia, unspecified: Secondary | ICD-10-CM | POA: Diagnosis not present

## 2016-10-14 DIAGNOSIS — Z950 Presence of cardiac pacemaker: Secondary | ICD-10-CM | POA: Diagnosis not present

## 2016-10-14 DIAGNOSIS — Z6835 Body mass index (BMI) 35.0-35.9, adult: Secondary | ICD-10-CM | POA: Diagnosis not present

## 2016-10-14 DIAGNOSIS — Z87891 Personal history of nicotine dependence: Secondary | ICD-10-CM | POA: Diagnosis not present

## 2016-10-14 DIAGNOSIS — I480 Paroxysmal atrial fibrillation: Secondary | ICD-10-CM | POA: Diagnosis not present

## 2016-10-14 DIAGNOSIS — Z856 Personal history of leukemia: Secondary | ICD-10-CM | POA: Diagnosis not present

## 2016-10-14 DIAGNOSIS — I472 Ventricular tachycardia: Secondary | ICD-10-CM | POA: Diagnosis not present

## 2016-10-14 DIAGNOSIS — M130 Polyarthritis, unspecified: Secondary | ICD-10-CM | POA: Diagnosis not present

## 2016-10-14 DIAGNOSIS — Z7901 Long term (current) use of anticoagulants: Secondary | ICD-10-CM | POA: Diagnosis not present

## 2016-10-14 DIAGNOSIS — Z7951 Long term (current) use of inhaled steroids: Secondary | ICD-10-CM | POA: Diagnosis not present

## 2016-10-14 DIAGNOSIS — Z791 Long term (current) use of non-steroidal anti-inflammatories (NSAID): Secondary | ICD-10-CM | POA: Diagnosis not present

## 2016-10-14 DIAGNOSIS — M4306 Spondylolysis, lumbar region: Secondary | ICD-10-CM | POA: Diagnosis not present

## 2016-10-14 DIAGNOSIS — I10 Essential (primary) hypertension: Secondary | ICD-10-CM | POA: Diagnosis not present

## 2016-10-15 ENCOUNTER — Encounter
Admission: RE | Admit: 2016-10-15 | Discharge: 2016-10-15 | Disposition: A | Discharge status: Home / Self Care | Payer: Medicare HMO | Source: Ambulatory Visit | Attending: Orthopedic Surgery | Admitting: Orthopedic Surgery

## 2016-10-15 DIAGNOSIS — Z01812 Encounter for preprocedural laboratory examination: Secondary | ICD-10-CM | POA: Insufficient documentation

## 2016-10-15 HISTORY — DX: Personal history of antineoplastic chemotherapy: Z92.21

## 2016-10-15 HISTORY — DX: Presence of cardiac pacemaker: Z95.0

## 2016-10-15 HISTORY — DX: Anxiety disorder, unspecified: F41.9

## 2016-10-15 HISTORY — DX: Other complications of anesthesia, initial encounter: T88.59XA

## 2016-10-15 HISTORY — DX: Unspecified hearing loss, unspecified ear: H91.90

## 2016-10-15 HISTORY — DX: Adverse effect of unspecified anesthetic, initial encounter: T41.45XA

## 2016-10-15 LAB — URINALYSIS, ROUTINE W REFLEX MICROSCOPIC
BACTERIA UA: NONE SEEN
BILIRUBIN URINE: NEGATIVE
Glucose, UA: NEGATIVE mg/dL
Ketones, ur: NEGATIVE mg/dL
LEUKOCYTES UA: NEGATIVE
NITRITE: NEGATIVE
PROTEIN: NEGATIVE mg/dL
Specific Gravity, Urine: 1.014 (ref 1.005–1.030)
pH: 6 (ref 5.0–8.0)

## 2016-10-15 LAB — CBC
HCT: 40.3 % (ref 40.0–52.0)
Hemoglobin: 14.2 g/dL (ref 13.0–18.0)
MCH: 32.7 pg (ref 26.0–34.0)
MCHC: 35.1 g/dL (ref 32.0–36.0)
MCV: 93 fL (ref 80.0–100.0)
PLATELETS: 131 10*3/uL — AB (ref 150–440)
RBC: 4.33 MIL/uL — ABNORMAL LOW (ref 4.40–5.90)
RDW: 14.9 % — AB (ref 11.5–14.5)
WBC: 6.6 10*3/uL (ref 3.8–10.6)

## 2016-10-15 LAB — COMPREHENSIVE METABOLIC PANEL
ALT: 30 U/L (ref 17–63)
AST: 21 U/L (ref 15–41)
Albumin: 4.6 g/dL (ref 3.5–5.0)
Alkaline Phosphatase: 82 U/L (ref 38–126)
Anion gap: 6 (ref 5–15)
BILIRUBIN TOTAL: 1.2 mg/dL (ref 0.3–1.2)
BUN: 17 mg/dL (ref 6–20)
CO2: 29 mmol/L (ref 22–32)
Calcium: 9 mg/dL (ref 8.9–10.3)
Chloride: 105 mmol/L (ref 101–111)
Creatinine, Ser: 0.98 mg/dL (ref 0.61–1.24)
GFR calc non Af Amer: >60 mL/min (ref >60)
Glucose, Bld: 111 mg/dL — ABNORMAL HIGH (ref 65–99)
POTASSIUM: 3.6 mmol/L (ref 3.5–5.1)
Sodium: 140 mmol/L (ref 135–145)
TOTAL PROTEIN: 7.3 g/dL (ref 6.5–8.1)

## 2016-10-15 LAB — C-REACTIVE PROTEIN

## 2016-10-15 LAB — TYPE AND SCREEN
ABO/RH(D): O NEG
ANTIBODY SCREEN: NEGATIVE

## 2016-10-15 LAB — PROTIME-INR
INR: 1.16
PROTHROMBIN TIME: 14.9 s (ref 11.4–15.2)

## 2016-10-15 LAB — SURGICAL PCR SCREEN
MRSA, PCR: NEGATIVE
Staphylococcus aureus: NEGATIVE

## 2016-10-15 LAB — APTT: aPTT: 33 seconds (ref 24–36)

## 2016-10-15 LAB — SEDIMENTATION RATE: SED RATE: 5 mm/h (ref 0–20)

## 2016-10-15 NOTE — Patient Instructions (Signed)
Your procedure is scheduled on: October 22, 2016 (Wednesday) Report to Same Day Surgery 2nd floor medical mall [facility] Entrance-take elevator on left to 2nd floor.  Check in with surgery information desk.) To find out your arrival time please call [phone] between 1PM - 3PM on October 21, 2016 (Tuesday)  Remember: Instructions that are not followed completely may result in serious medical risk, up to and including death, or upon the discretion of your surgeon and anesthesiologist your surgery may need to be rescheduled.    _x___ 1. Do not eat food or drink liquids after midnight. No gum chewing or hard candies                            .     __x__ 2. No Alcohol for 24 hours before or after surgery.   __x__3. No Smoking for 24 prior to surgery.   ____  4. Bring all medications with you on the day of surgery if instructed.    __x__ 5. Notify your doctor if there is any change in your medical condition     (cold, fever, infections).     Do not wear jewelry, make-up, hairpins, clips or nail polish.  Do not wear lotions, powders, or perfumes. You may wear deodorant.  Do not shave 48 hours prior to surgery. Men may shave face and neck.  Do not bring valuables to the hospital.    [facility] is not responsible for any belongings or valuables.               Contacts, dentures or bridgework may not be worn into surgery.  Leave your suitcase in the car. After surgery it may be brought to your room.  For patients admitted to the hospital, discharge time is determined by your                       treatment team.   Patients discharged the day of surgery will not be allowed to drive home.  You will need someone to drive you home and stay with you the night of your procedure.    Please read over the following fact sheets that you were given:   [facility] Preparing for Surgery and or MRSA Information   _x___ Take anti-hypertensive (unless it includes a diuretic), cardiac, seizure, asthma,      anti-reflux and psychiatric medicines with a sip of water. These include:  1. METOPROLOL  2. AMLODIPINE  3. AMIODARONE  4. ISOSORBIDE MONONITRATE  5.  6.  ____Fleets enema or Magnesium Citrate as directed.   _x___ Use CHG Soap or sage wipes as directed on instruction sheet   __x__ Use inhalers on the day of surgery and bring to hospital day of surgery (USE SYMBICORT, PROVENTIL, AND SPIRIVA INHALERS THE MORNING OF SURGERY AND BRING SYMBICORT AND [place])  ____ Stop Metformin and Janumet 2 days prior to surgery.    ____ Take 1/2 of usual insulin dose the night before surgery and none on the morning surgery       _x___ Follow recommendations from Cardiologist, Pulmonologist or PCP regarding          stopping Aspirin, Coumadin, Pllavix ,Eliquis, Effient, or Pradaxa, and Pletal. (STOP ELIQUIS ON APRIL   1 )  X____Stop Anti-inflammatories such as Advil, Aleve, Ibuprofen, Motrin, Naproxen, Naprosyn, Goodies powders or aspirin products. OK to take Tylenol  _x___ Stop supplements until after surgery.  But may continue Vitamin D, Vitamin B, and multivitamin (STOP ONE-A-DAY  KRILL OIL, and CO Q 10 NOW)   _x___ Bring C-Pap to the hospital.

## 2016-10-16 ENCOUNTER — Telehealth: Payer: Self-pay | Admitting: Cardiovascular Disease

## 2016-10-16 NOTE — Telephone Encounter (Signed)
Received cardiac clearance request for pt to proceed w/ Right Total Hip Arthoplasty w/ Dr. Marry Guan on 10/22/16. Pt has [employer] pacemaker - they will provided continuous EKG monitoring when magnet is used or reprogramming is to be performed.  Please route clearance and recommendations for pt's Eliquis to pre-admit testing @ [phone].

## 2016-10-17 LAB — URINE CULTURE: SPECIAL REQUESTS: NORMAL

## 2016-10-20 NOTE — Telephone Encounter (Signed)
acceptable risk for surgery Would hold eliquis 2 days prior to procedure In terms of managing his device, would confirm directions with Dr. Caryl Comes

## 2016-10-20 NOTE — Telephone Encounter (Signed)
[facility]aker Janus calling to check on status of device specific clearance form .  [phone]

## 2016-10-21 MED ORDER — TRANEXAMIC ACID 1000 MG/10ML IV SOLN
1000.0000 mg | INTRAVENOUS | Status: DC
  Filled 2016-10-21: qty 10

## 2016-10-21 MED ORDER — CEFAZOLIN SODIUM-DEXTROSE 2-4 GM/100ML-% IV SOLN: 2.0000 g | INTRAVENOUS | Status: DC

## 2016-10-21 NOTE — Progress Notes (Signed)
No Perioperative prescription for implanted cardiac device programming received from cardiology.  Discussed with Dr. Doyce Loose, anesthesiologist.  Faythe Ghee to proceed.

## 2016-10-21 NOTE — Telephone Encounter (Signed)
Perioperative Pacemaker form completed by Dr. Caryl Comes and faxed to [facility] at [phone]. Confirmation received.

## 2016-10-21 NOTE — Pre-Procedure Instructions (Signed)
ASHLEY FROM DR S KLEIN'S OFFICE CALLED AND SAID DR Caryl Comes SAID TO HAVE MAGNET AVAIABLE. SHE WILL FAXED SIGNED ORDER SHEET TO [employer]

## 2016-10-22 ENCOUNTER — Inpatient Hospital Stay: Payer: Medicare HMO | Admitting: Certified Registered Nurse Anesthetist

## 2016-10-22 ENCOUNTER — Encounter
Admission: RE | Disposition: A | Discharge status: Home / Self Care | Payer: Self-pay | Source: Ambulatory Visit | Attending: Orthopedic Surgery

## 2016-10-22 ENCOUNTER — Encounter: Payer: Self-pay | Admitting: Orthopedic Surgery

## 2016-10-22 ENCOUNTER — Inpatient Hospital Stay: Payer: Medicare HMO

## 2016-10-22 ENCOUNTER — Inpatient Hospital Stay
Admission: RE | Admit: 2016-10-22 | Discharge: 2016-10-24 | DRG: 470 | Disposition: A | Discharge status: Home / Self Care | Payer: Medicare HMO | Source: Ambulatory Visit | Attending: Orthopedic Surgery | Admitting: Orthopedic Surgery

## 2016-10-22 DIAGNOSIS — I5032 Chronic diastolic (congestive) heart failure: Secondary | ICD-10-CM | POA: Diagnosis not present

## 2016-10-22 DIAGNOSIS — Z96649 Presence of unspecified artificial hip joint: Secondary | ICD-10-CM

## 2016-10-22 DIAGNOSIS — H9190 Unspecified hearing loss, unspecified ear: Secondary | ICD-10-CM | POA: Diagnosis present

## 2016-10-22 DIAGNOSIS — M1611 Unilateral primary osteoarthritis, right hip: Principal | ICD-10-CM | POA: Diagnosis present

## 2016-10-22 DIAGNOSIS — I2510 Atherosclerotic heart disease of native coronary artery without angina pectoris: Secondary | ICD-10-CM | POA: Diagnosis not present

## 2016-10-22 DIAGNOSIS — I252 Old myocardial infarction: Secondary | ICD-10-CM | POA: Diagnosis not present

## 2016-10-22 DIAGNOSIS — Z951 Presence of aortocoronary bypass graft: Secondary | ICD-10-CM

## 2016-10-22 DIAGNOSIS — J449 Chronic obstructive pulmonary disease, unspecified: Secondary | ICD-10-CM | POA: Diagnosis not present

## 2016-10-22 DIAGNOSIS — M1990 Unspecified osteoarthritis, unspecified site: Secondary | ICD-10-CM | POA: Diagnosis not present

## 2016-10-22 DIAGNOSIS — Z9221 Personal history of antineoplastic chemotherapy: Secondary | ICD-10-CM | POA: Diagnosis not present

## 2016-10-22 DIAGNOSIS — Z96641 Presence of right artificial hip joint: Secondary | ICD-10-CM

## 2016-10-22 DIAGNOSIS — Z471 Aftercare following joint replacement surgery: Secondary | ICD-10-CM | POA: Diagnosis not present

## 2016-10-22 DIAGNOSIS — G4733 Obstructive sleep apnea (adult) (pediatric): Secondary | ICD-10-CM | POA: Diagnosis present

## 2016-10-22 DIAGNOSIS — I110 Hypertensive heart disease with heart failure: Secondary | ICD-10-CM | POA: Diagnosis present

## 2016-10-22 DIAGNOSIS — I509 Heart failure, unspecified: Secondary | ICD-10-CM | POA: Diagnosis not present

## 2016-10-22 DIAGNOSIS — Z856 Personal history of leukemia: Secondary | ICD-10-CM

## 2016-10-22 DIAGNOSIS — M169 Osteoarthritis of hip, unspecified: Secondary | ICD-10-CM | POA: Diagnosis not present

## 2016-10-22 HISTORY — PX: TOTAL HIP ARTHROPLASTY: SHX124

## 2016-10-22 HISTORY — DX: Presence of right artificial hip joint: Z96.641

## 2016-10-22 LAB — ABO/RH: ABO/RH(D): O NEG

## 2016-10-22 IMAGING — DX DG HIP (WITH OR WITHOUT PELVIS) 1V PORT*R*
3 series · 3 of 3 positions shown · non-contrast
Comparison: [DATE]

CLINICAL DATA: Right hip arthroplasty

EXAM:
DG HIP (WITH OR WITHOUT PELVIS) 1V PORT RIGHT

[pelvis ap]
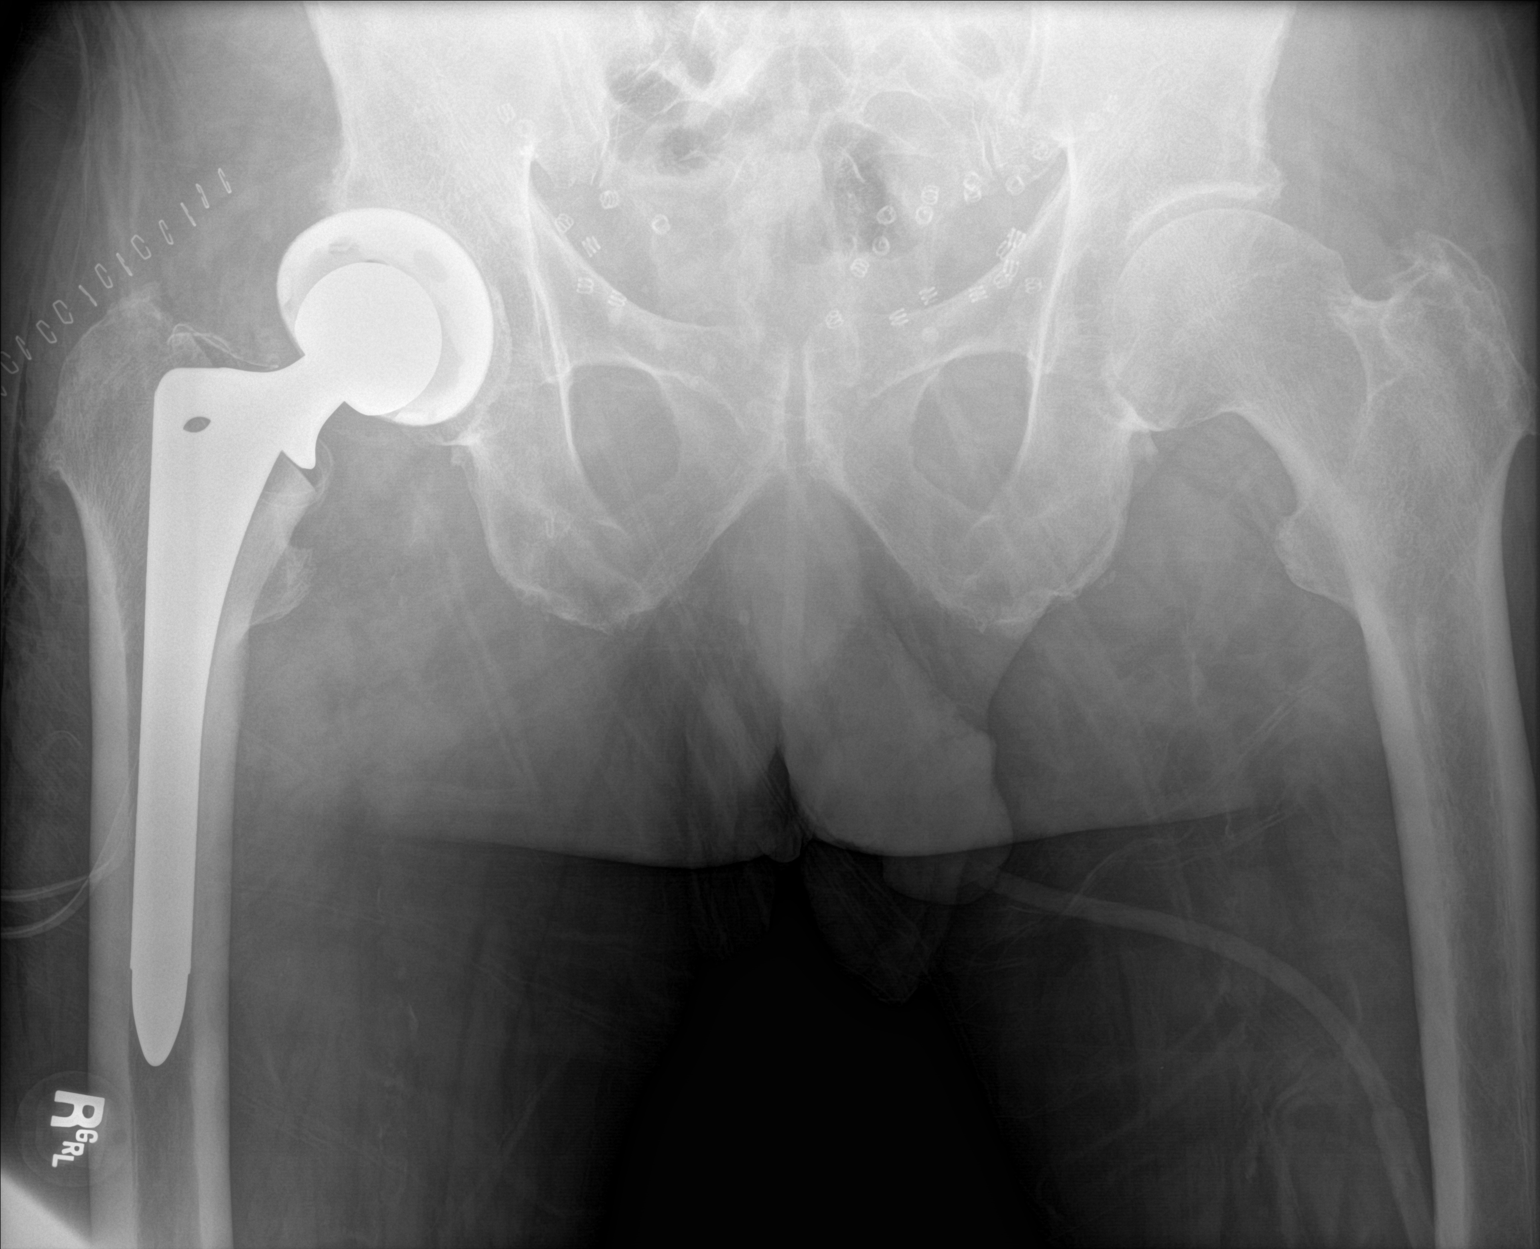

[hip ap]
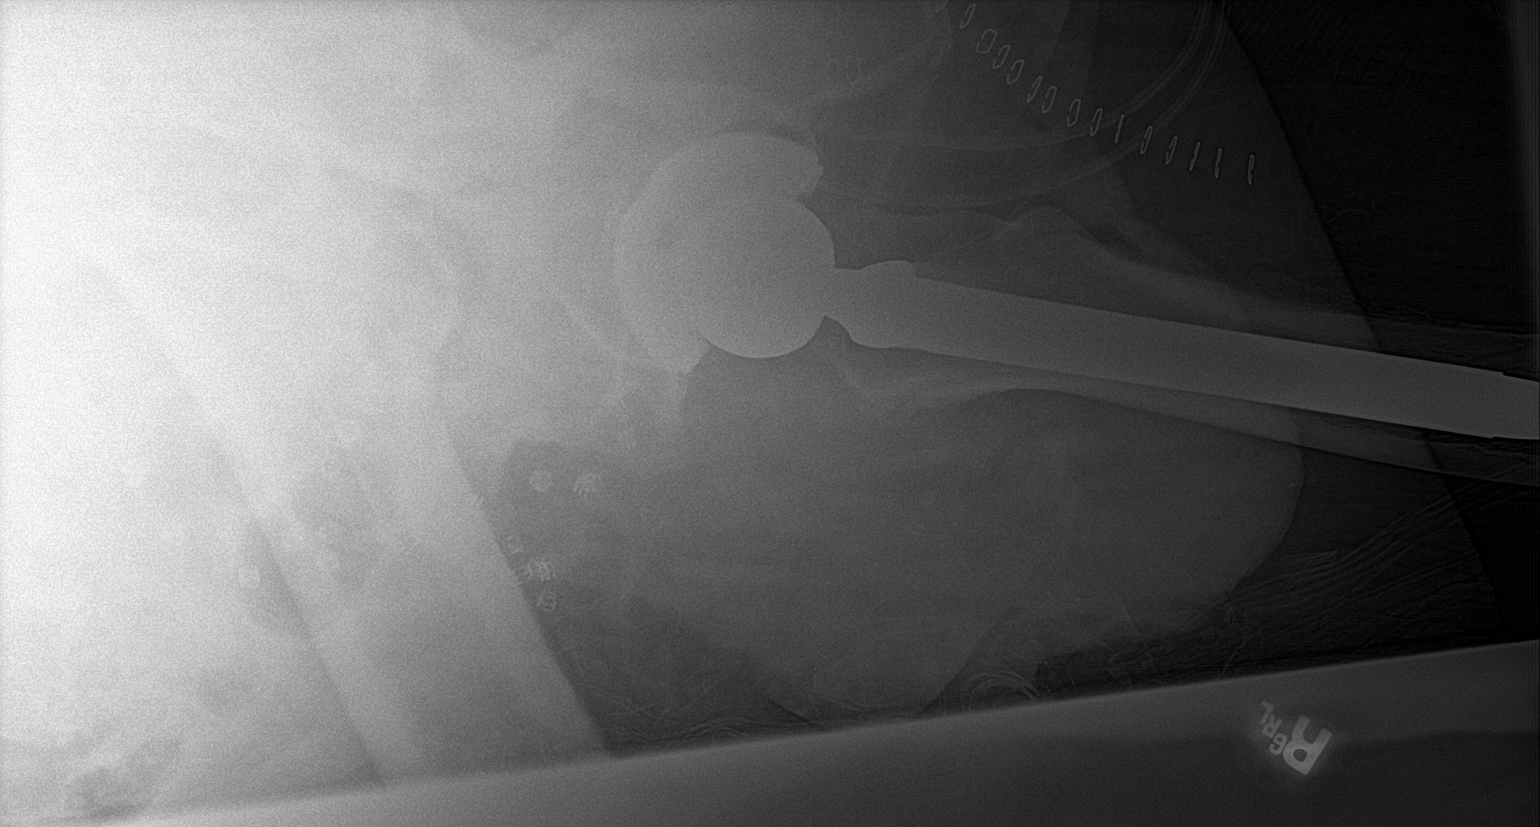

[hip lat]
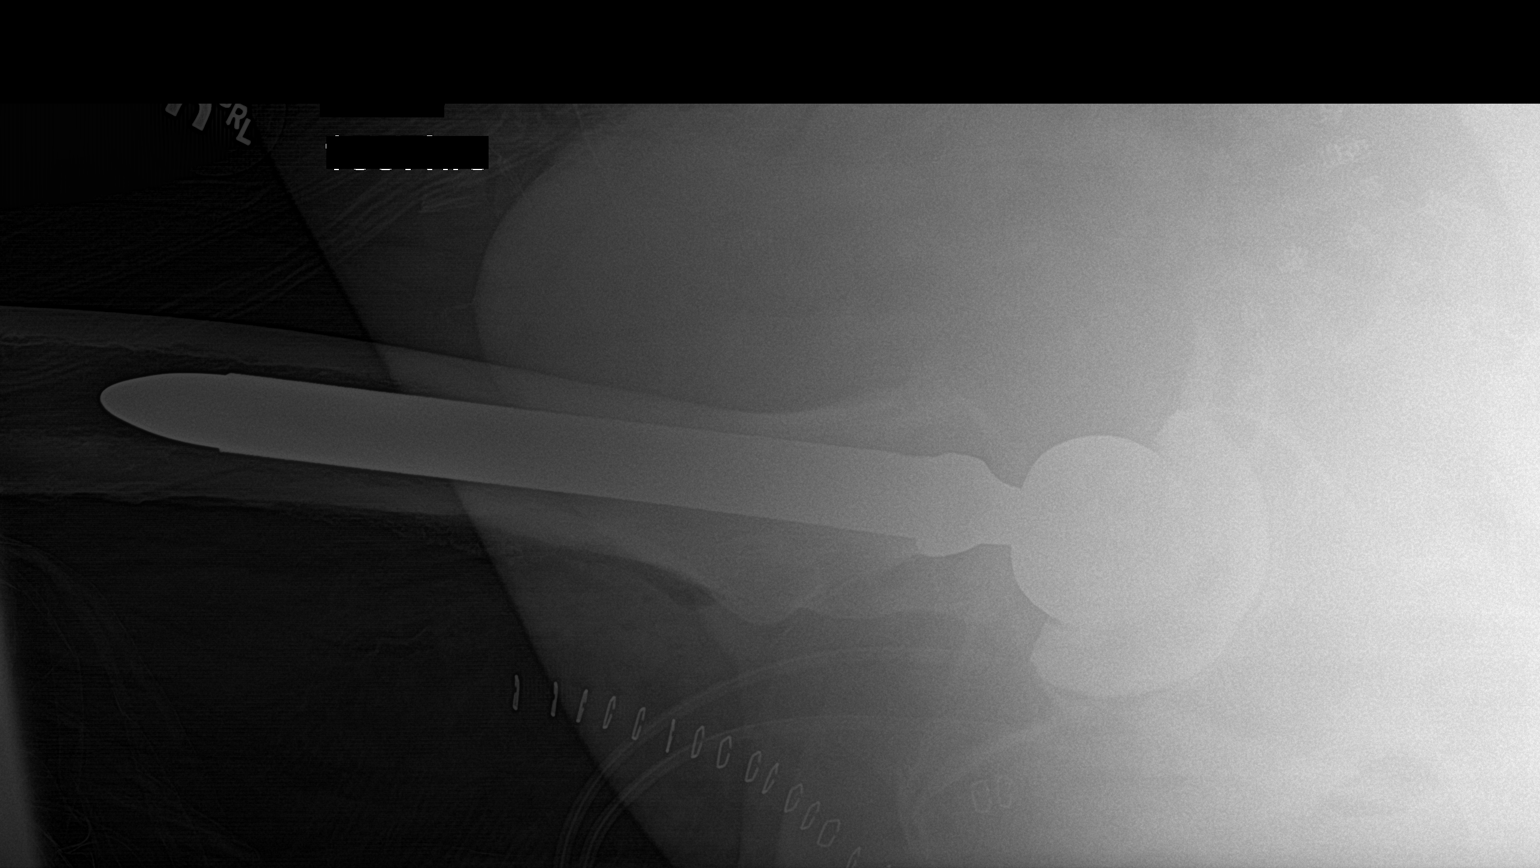

[3 of 3 positions shown; findings below may reference images not displayed]

FINDINGS: New total right hip arthroplasty that is located. No periprosthetic
fracture. Surgical drain and soft tissue gas noted.
IMPRESSION: Total right hip arthroplasty without acute finding.

## 2016-10-22 SURGERY — ARTHROPLASTY, HIP, TOTAL,POSTERIOR APPROACH
Anesthesia: Spinal | Laterality: Right | Wound class: Clean

## 2016-10-22 MED ORDER — PROPOFOL 500 MG/50ML IV EMUL
INTRAVENOUS | Status: AC
  Filled 2016-10-22: qty 50

## 2016-10-22 MED ORDER — ISOSORBIDE MONONITRATE ER 30 MG PO TB24
30.0000 mg | ORAL_TABLET | Freq: Every day | ORAL | Status: DC
  Administered 2016-10-24: 30 mg via ORAL
  Filled 2016-10-22: qty 1

## 2016-10-22 MED ORDER — SODIUM CHLORIDE 0.9 % IV SOLN
INTRAVENOUS | Status: DC | PRN
  Administered 2016-10-22: 25 ug/min via INTRAVENOUS

## 2016-10-22 MED ORDER — FLEET ENEMA 7-19 GM/118ML RE ENEM: 1.0000 | ENEMA | Freq: Once | RECTAL | Status: DC | PRN

## 2016-10-22 MED ORDER — METOLAZONE 2.5 MG PO TABS: 5.0000 mg | ORAL_TABLET | Freq: Two times a day (BID) | ORAL | Status: DC | PRN

## 2016-10-22 MED ORDER — ACETAMINOPHEN 650 MG RE SUPP: 650.0000 mg | Freq: Four times a day (QID) | RECTAL | Status: DC | PRN

## 2016-10-22 MED ORDER — NEOMYCIN-POLYMYXIN B GU 40-200000 IR SOLN
Status: AC
  Filled 2016-10-22: qty 20

## 2016-10-22 MED ORDER — BUPIVACAINE HCL (PF) 0.5 % IJ SOLN
INTRAMUSCULAR | Status: DC | PRN
  Administered 2016-10-22: 2.5 mL

## 2016-10-22 MED ORDER — ACETAMINOPHEN 10 MG/ML IV SOLN
INTRAVENOUS | Status: AC
  Filled 2016-10-22: qty 100

## 2016-10-22 MED ORDER — MOMETASONE FURO-FORMOTEROL FUM 200-5 MCG/ACT IN AERO
2.0000 | INHALATION_SPRAY | Freq: Two times a day (BID) | RESPIRATORY_TRACT | Status: DC
  Administered 2016-10-22 – 2016-10-23 (×3): 2 via RESPIRATORY_TRACT
  Filled 2016-10-22: qty 8.8

## 2016-10-22 MED ORDER — BISACODYL 10 MG RE SUPP
10.0000 mg | Freq: Every day | RECTAL | Status: DC | PRN
  Administered 2016-10-24: 10 mg via RECTAL
  Filled 2016-10-22: qty 1

## 2016-10-22 MED ORDER — ACETAMINOPHEN 325 MG PO TABS: 650.0000 mg | ORAL_TABLET | Freq: Four times a day (QID) | ORAL | Status: DC | PRN

## 2016-10-22 MED ORDER — ADULT MULTIVITAMIN W/MINERALS CH
1.0000 | ORAL_TABLET | Freq: Every day | ORAL | Status: DC
  Administered 2016-10-23 – 2016-10-24 (×2): 1 via ORAL
  Filled 2016-10-22 (×2): qty 1

## 2016-10-22 MED ORDER — FENTANYL CITRATE (PF) 100 MCG/2ML IJ SOLN
INTRAMUSCULAR | Status: DC | PRN
  Administered 2016-10-22: 50 ug via INTRAVENOUS
  Administered 2016-10-22: 25 ug via INTRAVENOUS

## 2016-10-22 MED ORDER — KETAMINE HCL 50 MG/ML IJ SOLN
INTRAMUSCULAR | Status: DC | PRN
  Administered 2016-10-22: 25 mg via INTRAMUSCULAR

## 2016-10-22 MED ORDER — ALBUTEROL SULFATE (2.5 MG/3ML) 0.083% IN NEBU: 3.0000 mL | INHALATION_SOLUTION | Freq: Four times a day (QID) | RESPIRATORY_TRACT | Status: DC | PRN

## 2016-10-22 MED ORDER — ATORVASTATIN CALCIUM 20 MG PO TABS
80.0000 mg | ORAL_TABLET | Freq: Every day | ORAL | Status: DC
Start: 2016-10-22 — End: 2016-10-24
  Administered 2016-10-22 – 2016-10-23 (×2): 80 mg via ORAL
  Filled 2016-10-22 (×2): qty 4

## 2016-10-22 MED ORDER — ACETAMINOPHEN 10 MG/ML IV SOLN
INTRAVENOUS | Status: DC | PRN
  Administered 2016-10-22: 1000 mg via INTRAVENOUS

## 2016-10-22 MED ORDER — MENTHOL 3 MG MT LOZG
1.0000 | LOZENGE | OROMUCOSAL | Status: DC | PRN
  Filled 2016-10-22: qty 9

## 2016-10-22 MED ORDER — AMLODIPINE BESYLATE 5 MG PO TABS
5.0000 mg | ORAL_TABLET | Freq: Every day | ORAL | Status: DC
  Administered 2016-10-24: 5 mg via ORAL
  Filled 2016-10-22 (×2): qty 1

## 2016-10-22 MED ORDER — ONDANSETRON HCL 4 MG/2ML IJ SOLN
INTRAMUSCULAR | Status: AC
  Filled 2016-10-22: qty 2

## 2016-10-22 MED ORDER — TRANEXAMIC ACID 1000 MG/10ML IV SOLN
INTRAVENOUS | Status: DC | PRN
  Administered 2016-10-22: 1000 mg via INTRAVENOUS

## 2016-10-22 MED ORDER — SODIUM CHLORIDE 0.9 % IV SOLN
INTRAVENOUS | Status: DC
  Administered 2016-10-22 – 2016-10-23 (×2): via INTRAVENOUS

## 2016-10-22 MED ORDER — TIOTROPIUM BROMIDE MONOHYDRATE 18 MCG IN CAPS
18.0000 ug | ORAL_CAPSULE | Freq: Every day | RESPIRATORY_TRACT | Status: DC
  Administered 2016-10-22 – 2016-10-23 (×2): 18 ug via RESPIRATORY_TRACT
  Filled 2016-10-22: qty 5

## 2016-10-22 MED ORDER — NEOMYCIN-POLYMYXIN B GU 40-200000 IR SOLN
Status: DC | PRN
  Administered 2016-10-22: 14 mL

## 2016-10-22 MED ORDER — LIDOCAINE HCL (CARDIAC) 20 MG/ML IV SOLN
INTRAVENOUS | Status: DC | PRN
  Administered 2016-10-22: 50 mg via INTRAVENOUS

## 2016-10-22 MED ORDER — MIDAZOLAM HCL 2 MG/2ML IJ SOLN
INTRAMUSCULAR | Status: AC
  Filled 2016-10-22: qty 2

## 2016-10-22 MED ORDER — HYDROMORPHONE HCL 1 MG/ML IJ SOLN
INTRAMUSCULAR | Status: AC
  Filled 2016-10-22: qty 1

## 2016-10-22 MED ORDER — CEFAZOLIN SODIUM-DEXTROSE 2-3 GM-% IV SOLR
2.0000 g | Freq: Four times a day (QID) | INTRAVENOUS | Status: DC
  Filled 2016-10-22 (×4): qty 50

## 2016-10-22 MED ORDER — MIRTAZAPINE 15 MG PO TABS
15.0000 mg | ORAL_TABLET | Freq: Every day | ORAL | Status: DC
  Administered 2016-10-22 – 2016-10-23 (×2): 15 mg via ORAL
  Filled 2016-10-22 (×2): qty 1

## 2016-10-22 MED ORDER — HYDROMORPHONE HCL 1 MG/ML IJ SOLN
INTRAMUSCULAR | Status: DC | PRN
  Administered 2016-10-22 (×2): 0.5 mg via INTRAVENOUS

## 2016-10-22 MED ORDER — DEXTROSE 5 % IV SOLN
2.0000 g | Freq: Four times a day (QID) | INTRAVENOUS | Status: AC
  Administered 2016-10-22 – 2016-10-23 (×4): 2 g via INTRAVENOUS
  Filled 2016-10-22 (×5): qty 20

## 2016-10-22 MED ORDER — CEFAZOLIN SODIUM-DEXTROSE 2-4 GM/100ML-% IV SOLN
INTRAVENOUS | Status: AC
  Filled 2016-10-22: qty 100

## 2016-10-22 MED ORDER — SENNOSIDES-DOCUSATE SODIUM 8.6-50 MG PO TABS
1.0000 | ORAL_TABLET | Freq: Two times a day (BID) | ORAL | Status: DC
  Administered 2016-10-22 – 2016-10-24 (×4): 1 via ORAL
  Filled 2016-10-22 (×4): qty 1

## 2016-10-22 MED ORDER — FENTANYL CITRATE (PF) 100 MCG/2ML IJ SOLN
25.0000 ug | INTRAMUSCULAR | Status: DC | PRN
  Administered 2016-10-22 (×3): 25 ug via INTRAVENOUS

## 2016-10-22 MED ORDER — CHLORHEXIDINE GLUCONATE 4 % EX LIQD: 60.0000 mL | Freq: Once | CUTANEOUS | Status: DC

## 2016-10-22 MED ORDER — METOPROLOL TARTRATE 25 MG PO TABS
25.0000 mg | ORAL_TABLET | Freq: Two times a day (BID) | ORAL | Status: DC
  Administered 2016-10-24: 25 mg via ORAL
  Filled 2016-10-22: qty 1

## 2016-10-22 MED ORDER — LIDOCAINE HCL (PF) 2 % IJ SOLN
INTRAMUSCULAR | Status: AC
  Filled 2016-10-22: qty 2

## 2016-10-22 MED ORDER — FUROSEMIDE 20 MG PO TABS: 20.0000 mg | ORAL_TABLET | Freq: Two times a day (BID) | ORAL | Status: DC | PRN

## 2016-10-22 MED ORDER — CELECOXIB 200 MG PO CAPS
200.0000 mg | ORAL_CAPSULE | Freq: Two times a day (BID) | ORAL | Status: DC
  Administered 2016-10-22 – 2016-10-24 (×4): 200 mg via ORAL
  Filled 2016-10-22 (×4): qty 1

## 2016-10-22 MED ORDER — LORATADINE 10 MG PO TABS
10.0000 mg | ORAL_TABLET | Freq: Every day | ORAL | Status: DC
  Administered 2016-10-23 – 2016-10-24 (×2): 10 mg via ORAL
  Filled 2016-10-22 (×2): qty 1

## 2016-10-22 MED ORDER — TRAMADOL HCL 50 MG PO TABS
50.0000 mg | ORAL_TABLET | ORAL | Status: DC | PRN
  Administered 2016-10-22 – 2016-10-24 (×2): 100 mg via ORAL
  Filled 2016-10-22 (×2): qty 2

## 2016-10-22 MED ORDER — HYDROMORPHONE HCL 1 MG/ML IJ SOLN
0.5000 mg | INTRAMUSCULAR | Status: DC | PRN
  Administered 2016-10-22 – 2016-10-23 (×2): 0.5 mg via INTRAVENOUS
  Filled 2016-10-22 (×3): qty 1

## 2016-10-22 MED ORDER — ONDANSETRON HCL 4 MG/2ML IJ SOLN: 4.0000 mg | Freq: Once | INTRAMUSCULAR | Status: DC | PRN

## 2016-10-22 MED ORDER — ONDANSETRON HCL 4 MG/2ML IJ SOLN
4.0000 mg | Freq: Four times a day (QID) | INTRAMUSCULAR | Status: DC | PRN
  Filled 2016-10-22: qty 2

## 2016-10-22 MED ORDER — PHENYLEPHRINE HCL 10 MG/ML IJ SOLN
INTRAMUSCULAR | Status: DC | PRN
  Administered 2016-10-22: 200 ug via INTRAVENOUS
  Administered 2016-10-22 (×2): 100 ug via INTRAVENOUS
  Administered 2016-10-22 (×3): 200 ug via INTRAVENOUS
  Administered 2016-10-22: 100 ug via INTRAVENOUS
  Administered 2016-10-22: 200 ug via INTRAVENOUS

## 2016-10-22 MED ORDER — FAMOTIDINE 20 MG PO TABS
20.0000 mg | ORAL_TABLET | Freq: Once | ORAL | Status: AC
  Administered 2016-10-22: 20 mg via ORAL

## 2016-10-22 MED ORDER — CEFAZOLIN SODIUM-DEXTROSE 2-4 GM/100ML-% IV SOLN: 2.0000 g | Freq: Four times a day (QID) | INTRAVENOUS | Status: DC

## 2016-10-22 MED ORDER — ALUM & MAG HYDROXIDE-SIMETH 200-200-20 MG/5ML PO SUSP: 30.0000 mL | ORAL | Status: DC | PRN

## 2016-10-22 MED ORDER — ONDANSETRON HCL 4 MG/2ML IJ SOLN
INTRAMUSCULAR | Status: DC | PRN
  Administered 2016-10-22: 4 mg via INTRAVENOUS

## 2016-10-22 MED ORDER — CEFAZOLIN SODIUM-DEXTROSE 2-3 GM-% IV SOLR
INTRAVENOUS | Status: DC | PRN
  Administered 2016-10-22: 2 g via INTRAVENOUS

## 2016-10-22 MED ORDER — OXYCODONE HCL 5 MG PO TABS
5.0000 mg | ORAL_TABLET | ORAL | Status: DC | PRN
  Administered 2016-10-22 – 2016-10-24 (×6): 10 mg via ORAL
  Filled 2016-10-22 (×6): qty 2

## 2016-10-22 MED ORDER — AMIODARONE HCL 200 MG PO TABS
100.0000 mg | ORAL_TABLET | Freq: Two times a day (BID) | ORAL | Status: DC
  Administered 2016-10-22 – 2016-10-24 (×3): 100 mg via ORAL
  Filled 2016-10-22 (×3): qty 1

## 2016-10-22 MED ORDER — PROPOFOL 10 MG/ML IV BOLUS
INTRAVENOUS | Status: DC | PRN
  Administered 2016-10-22: 40 mg via INTRAVENOUS

## 2016-10-22 MED ORDER — APIXABAN 5 MG PO TABS
5.0000 mg | ORAL_TABLET | Freq: Two times a day (BID) | ORAL | Status: DC
  Administered 2016-10-23 – 2016-10-24 (×3): 5 mg via ORAL
  Filled 2016-10-22 (×3): qty 1

## 2016-10-22 MED ORDER — TRANEXAMIC ACID 1000 MG/10ML IV SOLN
1000.0000 mg | Freq: Once | INTRAVENOUS | Status: AC
  Administered 2016-10-22: 1000 mg via INTRAVENOUS
  Filled 2016-10-22: qty 10

## 2016-10-22 MED ORDER — OMEGA-3-ACID ETHYL ESTERS 1 G PO CAPS
1.0000 g | ORAL_CAPSULE | Freq: Every evening | ORAL | Status: DC
  Administered 2016-10-23: 1 g via ORAL
  Filled 2016-10-22: qty 1

## 2016-10-22 MED ORDER — MORPHINE SULFATE (PF) 2 MG/ML IV SOLN
2.0000 mg | INTRAVENOUS | Status: DC | PRN
  Administered 2016-10-22: 2 mg via INTRAVENOUS
  Filled 2016-10-22: qty 1

## 2016-10-22 MED ORDER — PHENOL 1.4 % MT LIQD
1.0000 | OROMUCOSAL | Status: DC | PRN
Start: 2016-10-22 — End: 2016-10-24
  Filled 2016-10-22: qty 177

## 2016-10-22 MED ORDER — FERROUS SULFATE 325 (65 FE) MG PO TABS
325.0000 mg | ORAL_TABLET | Freq: Two times a day (BID) | ORAL | Status: DC
  Administered 2016-10-23 – 2016-10-24 (×3): 325 mg via ORAL
  Filled 2016-10-22 (×3): qty 1

## 2016-10-22 MED ORDER — ACETAMINOPHEN 10 MG/ML IV SOLN
1000.0000 mg | Freq: Four times a day (QID) | INTRAVENOUS | Status: AC
  Administered 2016-10-22 – 2016-10-23 (×4): 1000 mg via INTRAVENOUS
  Filled 2016-10-22 (×4): qty 100

## 2016-10-22 MED ORDER — PANTOPRAZOLE SODIUM 40 MG PO TBEC
40.0000 mg | DELAYED_RELEASE_TABLET | Freq: Two times a day (BID) | ORAL | Status: DC
  Administered 2016-10-22 – 2016-10-24 (×4): 40 mg via ORAL
  Filled 2016-10-22 (×4): qty 1

## 2016-10-22 MED ORDER — ONDANSETRON HCL 4 MG PO TABS: 4.0000 mg | ORAL_TABLET | Freq: Four times a day (QID) | ORAL | Status: DC | PRN

## 2016-10-22 MED ORDER — FENTANYL CITRATE (PF) 100 MCG/2ML IJ SOLN
INTRAMUSCULAR | Status: AC
  Filled 2016-10-22: qty 2

## 2016-10-22 MED ORDER — FAMOTIDINE 20 MG PO TABS
ORAL_TABLET | ORAL | Status: AC
  Administered 2016-10-22: 20 mg via ORAL
  Filled 2016-10-22: qty 1

## 2016-10-22 MED ORDER — PROPOFOL 500 MG/50ML IV EMUL
INTRAVENOUS | Status: DC | PRN
  Administered 2016-10-22: 65 ug/kg/min via INTRAVENOUS

## 2016-10-22 MED ORDER — NITROGLYCERIN 0.4 MG SL SUBL: 0.4000 mg | SUBLINGUAL_TABLET | SUBLINGUAL | Status: DC | PRN

## 2016-10-22 MED ORDER — MIDAZOLAM HCL 5 MG/5ML IJ SOLN
INTRAMUSCULAR | Status: DC | PRN
  Administered 2016-10-22: 2 mg via INTRAVENOUS

## 2016-10-22 MED ORDER — MAGNESIUM HYDROXIDE 400 MG/5ML PO SUSP
30.0000 mL | Freq: Every day | ORAL | Status: DC | PRN
  Administered 2016-10-24: 30 mL via ORAL
  Filled 2016-10-22: qty 30

## 2016-10-22 MED ORDER — METOCLOPRAMIDE HCL 10 MG PO TABS
10.0000 mg | ORAL_TABLET | Freq: Three times a day (TID) | ORAL | Status: DC
  Administered 2016-10-22 – 2016-10-24 (×7): 10 mg via ORAL
  Filled 2016-10-22 (×7): qty 1

## 2016-10-22 MED ORDER — LACTATED RINGERS IV SOLN
INTRAVENOUS | Status: DC
  Administered 2016-10-22: 11:00:00 via INTRAVENOUS

## 2016-10-22 MED ORDER — FENTANYL CITRATE (PF) 100 MCG/2ML IJ SOLN
INTRAMUSCULAR | Status: AC
  Administered 2016-10-22: 25 ug via INTRAVENOUS
  Filled 2016-10-22: qty 2

## 2016-10-22 MED ORDER — DIPHENHYDRAMINE HCL 12.5 MG/5ML PO ELIX: 12.5000 mg | ORAL_SOLUTION | ORAL | Status: DC | PRN

## 2016-10-22 SURGICAL SUPPLY — 52 items
BLADE DRUM FLTD (BLADE) ×2 IMPLANT
BLADE SAW 1 (BLADE) ×2 IMPLANT
CANISTER SUCT 1200ML W/VALVE (MISCELLANEOUS) ×2 IMPLANT
CANISTER SUCT 3000ML (MISCELLANEOUS) ×4 IMPLANT
CAPT HIP TOTAL 2 ×2 IMPLANT
CARTRIDGE OIL MAESTRO DRILL (MISCELLANEOUS) ×1 IMPLANT
CATH FOL LEG HOLDER (MISCELLANEOUS) ×2 IMPLANT
CATH TRAY METER 16FR LF (MISCELLANEOUS) ×2 IMPLANT
DIFFUSER MAESTRO (MISCELLANEOUS) ×2 IMPLANT
DRAPE INCISE IOBAN 66X60 STRL (DRAPES) ×2 IMPLANT
DRAPE SHEET LG 3/4 BI-LAMINATE (DRAPES) ×2 IMPLANT
DRSG DERMACEA 8X12 NADH (GAUZE/BANDAGES/DRESSINGS) ×2 IMPLANT
DRSG OPSITE POSTOP 3X4 (GAUZE/BANDAGES/DRESSINGS) ×2 IMPLANT
DRSG OPSITE POSTOP 4X12 (GAUZE/BANDAGES/DRESSINGS) ×2 IMPLANT
DRSG OPSITE POSTOP 4X14 (GAUZE/BANDAGES/DRESSINGS) ×2 IMPLANT
DRSG TEGADERM 4X4.75 (GAUZE/BANDAGES/DRESSINGS) ×2 IMPLANT
DURAPREP 26ML APPLICATOR (WOUND CARE) ×2 IMPLANT
ELECT BLADE 6.5 EXT (BLADE) ×2 IMPLANT
ELECT CAUTERY BLADE 6.4 (BLADE) ×2 IMPLANT
GLOVE BIOGEL M STRL SZ7.5 (GLOVE) ×4 IMPLANT
GLOVE BIOGEL PI IND STRL 9 (GLOVE) ×1 IMPLANT
GLOVE BIOGEL PI INDICATOR 9 (GLOVE) ×1
GLOVE INDICATOR 8.0 STRL GRN (GLOVE) ×2 IMPLANT
GLOVE SURG SYN 9.0  PF PI (GLOVE) ×1
GLOVE SURG SYN 9.0 PF PI (GLOVE) ×1 IMPLANT
GOWN STRL REUS W/ TWL LRG LVL3 (GOWN DISPOSABLE) ×2 IMPLANT
GOWN STRL REUS W/TWL 2XL LVL3 (GOWN DISPOSABLE) ×2 IMPLANT
GOWN STRL REUS W/TWL LRG LVL3 (GOWN DISPOSABLE) ×2
HANDPIECE INTERPULSE COAX TIP (DISPOSABLE) ×1
HEMOVAC 400CC 10FR (MISCELLANEOUS) ×2 IMPLANT
HOOD PEEL AWAY FLYTE STAYCOOL (MISCELLANEOUS) ×4 IMPLANT
KIT RM TURNOVER STRD PROC AR (KITS) ×2 IMPLANT
NDL SAFETY 18GX1.5 (NEEDLE) ×2 IMPLANT
NS IRRIG 500ML POUR BTL (IV SOLUTION) ×2 IMPLANT
OIL CARTRIDGE MAESTRO DRILL (MISCELLANEOUS) ×2
PACK HIP PROSTHESIS (MISCELLANEOUS) ×2 IMPLANT
SET HNDPC FAN SPRY TIP SCT (DISPOSABLE) ×1 IMPLANT
SOL .9 NS 3000ML IRR  AL (IV SOLUTION) ×1
SOL .9 NS 3000ML IRR UROMATIC (IV SOLUTION) ×1 IMPLANT
SOL PREP PVP 2OZ (MISCELLANEOUS) ×2
SOLUTION PREP PVP 2OZ (MISCELLANEOUS) ×1 IMPLANT
SPONGE DRAIN TRACH 4X4 STRL 2S (GAUZE/BANDAGES/DRESSINGS) ×2 IMPLANT
STAPLER SKIN PROX 35W (STAPLE) ×2 IMPLANT
SUT ETHIBOND #5 BRAIDED 30INL (SUTURE) ×2 IMPLANT
SUT VIC AB 0 CT1 36 (SUTURE) ×2 IMPLANT
SUT VIC AB 1 CT1 36 (SUTURE) ×4 IMPLANT
SUT VIC AB 2-0 CT1 27 (SUTURE) ×1
SUT VIC AB 2-0 CT1 TAPERPNT 27 (SUTURE) ×1 IMPLANT
SYR 20CC LL (SYRINGE) ×2 IMPLANT
TAPE ADH 3 LX (MISCELLANEOUS) ×2 IMPLANT
TAPE TRANSPORE STRL 2 31045 (GAUZE/BANDAGES/DRESSINGS) ×2 IMPLANT
TOWEL OR 17X26 4PK STRL BLUE (TOWEL DISPOSABLE) ×2 IMPLANT

## 2016-10-22 NOTE — NC FL2 (Signed)
[place] LEVEL OF CARE SCREENING TOOL     IDENTIFICATION  Patient Name: [name] Birthdate: 27-Dec-1945 Sex: male Admission Date (Current Location): 10/22/2016  [place] and [place] Number:  Engineering geologist and Address:  [facility], [address], [place], [place] [address]      Provider Number: [id number]  Attending Physician Name and Address:  Dereck Leep, MD  Relative Name and Phone Number:       Current Level of Care: Hospital Recommended Level of Care: [place] Prior Approval Number:    Date Approved/Denied:   PASRR Number:  [id number])  Discharge Plan: SNF    Current Diagnoses: Patient Active Problem List   Diagnosis Date Noted  . S/P total hip arthroplasty 10/22/2016  . Sick sinus syndrome ([place]) 07/08/2016  . Annual physical exam 07/08/2016  . Hip osteoarthritis 07/08/2016  . Chronic fatigue   . 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 PAD (peripheral artery disease) ([place])   . OSA on CPAP   . TIA (transient ischemic attack) 11/02/2015  . Chronic diastolic CHF (congestive heart failure) ([place]) 09/20/2015  . CLL (chronic lymphocytic leukemia) ([place])   . PTSD (post-traumatic stress disorder)   . Osteoarthritis of both knees 07/05/2015  . COPD (chronic obstructive pulmonary disease) ([place]) 01/01/2012  . Shortness of breath 10/09/2011  . Paroxysmal atrial fibrillation ([place]) 10/09/2011  . Hyperlipidemia 11/28/2009  . Essential hypertension 11/28/2009    Orientation RESPIRATION BLADDER Height & Weight     Self, Time, Situation, Place  Normal Continent Weight: 230 lb (104.3 kg) Height:  5\' 8"  (172.7 cm)  BEHAVIORAL SYMPTOMS/MOOD NEUROLOGICAL BOWEL NUTRITION STATUS   (none)  (none) Continent Diet (Diet: NPO to be advanced. )  AMBULATORY STATUS COMMUNICATION OF NEEDS Skin   Extensive Assist Verbally Surgical wounds (Incision: Right Hip. )                        Personal Care Assistance Level of Assistance  Bathing, Feeding, Dressing Bathing Assistance: Limited assistance Feeding assistance: Independent Dressing Assistance: Limited assistance     Functional Limitations Info  Sight, Hearing, Speech Sight Info: Adequate Hearing Info: Adequate Speech Info: Adequate    SPECIAL CARE FACTORS FREQUENCY  PT (By licensed PT), OT (By licensed OT)     PT Frequency:  (5) OT Frequency:  (5)            Contractures      Additional Factors Info  Code Status, Allergies Code Status Info:  (Full Code. ) Allergies Info:  (No Known Allergies. )           Current Medications (10/22/2016):  This is the current hospital active medication list Current Facility-Administered Medications  Medication Dose Route Frequency Provider Last Rate Last Dose  . 0.9 %  sodium chloride infusion   Intravenous Continuous Dereck Leep, MD      . acetaminophen (OFIRMEV) IV 1,000 mg  1,000 mg Intravenous Q6H Dereck Leep, MD      . acetaminophen (TYLENOL) tablet 650 mg  650 mg Oral Q6H PRN Dereck Leep, MD       Or  . acetaminophen (TYLENOL) suppository 650 mg  650 mg Rectal Q6H PRN Dereck Leep, MD      . albuterol (PROVENTIL) (2.5 MG/3ML) 0.083% nebulizer solution 3 mL  3 mL Inhalation Q6H PRN Dereck Leep, MD      .  alum & mag hydroxide-simeth (MAALOX/MYLANTA) 200-200-20 MG/5ML suspension 30 mL  30 mL Oral Q4H PRN Dereck Leep, MD      . amiodarone (PACERONE) tablet 100 mg  100 mg Oral BID Dereck Leep, MD      . amLODipine (NORVASC) tablet 5 mg  5 mg Oral Daily Dereck Leep, MD      . Derrill Memo ON 10/23/2016] apixaban (ELIQUIS) tablet 5 mg  5 mg Oral BID Dereck Leep, MD      . atorvastatin (LIPITOR) tablet 80 mg  80 mg Oral QHS Dereck Leep, MD      . bisacodyl (DULCOLAX) suppository 10 mg  10 mg Rectal Daily PRN Dereck Leep, MD      . ceFAZolin (ANCEF) 2-4 GM/100ML-% IVPB           . ceFAZolin (ANCEF) IVPB 2 g/50 mL premix  2 g Intravenous  Q6H Dereck Leep, MD      . celecoxib (CELEBREX) capsule 200 mg  200 mg Oral Q12H Dereck Leep, MD      . chlorhexidine (HIBICLENS) 4 % liquid 4 application  60 mL Topical Once Dereck Leep, MD      . diphenhydrAMINE (BENADRYL) 12.5 MG/5ML elixir 12.5-25 mg  12.5-25 mg Oral Q4H PRN Dereck Leep, MD      . ferrous sulfate tablet 325 mg  325 mg Oral BID WC Dereck Leep, MD      . furosemide (LASIX) tablet 20 mg  20 mg Oral BID PRN Dereck Leep, MD      . Derrill Memo ON 10/23/2016] isosorbide mononitrate (IMDUR) 24 hr tablet 30 mg  30 mg Oral Daily Dereck Leep, MD      . loratadine (CLARITIN) tablet 10 mg  10 mg Oral Daily Dereck Leep, MD      . magnesium hydroxide (MILK OF MAGNESIA) suspension 30 mL  30 mL Oral Daily PRN Dereck Leep, MD      . menthol-cetylpyridinium (CEPACOL) lozenge 3 mg  1 lozenge Oral PRN Dereck Leep, MD       Or  . phenol (CHLORASEPTIC) mouth spray 1 spray  1 spray Mouth/Throat PRN Dereck Leep, MD      . metoCLOPramide (REGLAN) tablet 10 mg  10 mg Oral TID AC & HS Dereck Leep, MD      . metolazone (ZAROXOLYN) tablet 5 mg  5 mg Oral BID PRN Dereck Leep, MD      . metoprolol tartrate (LOPRESSOR) tablet 25 mg  25 mg Oral BID Dereck Leep, MD      . mirtazapine (REMERON) tablet 15 mg  15 mg Oral QHS Dereck Leep, MD      . mometasone-formoterol (DULERA) 200-5 MCG/ACT inhaler 2 puff  2 puff Inhalation BID Dereck Leep, MD      . morphine 2 MG/ML injection 2 mg  2 mg Intravenous Q2H PRN Dereck Leep, MD      . multivitamin with minerals tablet 1 tablet  1 tablet Oral Daily Dereck Leep, MD      . nitroGLYCERIN (NITROSTAT) SL tablet 0.4 mg  0.4 mg Sublingual Q5 min PRN Dereck Leep, MD      . omega-3 acid ethyl esters (LOVAZA) capsule 1 g  1 g Oral QPM Dereck Leep, MD      . ondansetron (ZOFRAN) tablet 4 mg  4 mg Oral Q6H PRN Laurice Record Hooten,  MD       Or  . ondansetron (ZOFRAN) injection 4 mg  4 mg Intravenous Q6H PRN Dereck Leep, MD       . oxyCODONE (Oxy IR/ROXICODONE) immediate release tablet 5-10 mg  5-10 mg Oral Q4H PRN Dereck Leep, MD      . pantoprazole (PROTONIX) EC tablet 40 mg  40 mg Oral BID AC Dereck Leep, MD      . senna-docusate (Senokot-S) tablet 1 tablet  1 tablet Oral BID Dereck Leep, MD      . sodium phosphate (FLEET) 7-19 GM/118ML enema 1 enema  1 enema Rectal Once PRN Dereck Leep, MD      . tiotropium [facility]) inhalation capsule 18 mcg  18 mcg Inhalation QHS Dereck Leep, MD      . traMADol Veatrice Bourbon) tablet 50-100 mg  50-100 mg Oral Q4H PRN Dereck Leep, MD         Discharge Medications: Please see discharge summary for a list of discharge medications.  Relevant Imaging Results:  Relevant Lab Results:   Additional Information  (SSN: [id number])  Sample, Veronia Beets, LCSW

## 2016-10-22 NOTE — Anesthesia Procedure Notes (Signed)
Spinal  Patient location during procedure: OR Staffing Anesthesiologist: Gunnar Bulla Performed: anesthesiologist  Preanesthetic Checklist Completed: patient identified, site marked, surgical consent, pre-op evaluation, timeout performed, IV checked and risks and benefits discussed Spinal Block Patient position: sitting Prep: Betadine Patient monitoring: heart rate, cardiac monitor, continuous pulse ox and blood pressure Approach: midline Location: L3-4 Injection technique: single-shot Needle Needle type: Pencil-Tip  Needle gauge: 25 G Needle length: 9 cm Assessment Sensory level: T10 Additional Notes 1226 marcaine 2.5 ml.

## 2016-10-22 NOTE — Transfer of Care (Signed)
Immediate Anesthesia Transfer of Care Note  Patient: [name]  Procedure(s) Performed: Procedure(s): TOTAL HIP ARTHROPLASTY (Right)  Patient Location: PACU  Anesthesia Type:Spinal  Level of Consciousness: awake and alert   Airway & Oxygen Therapy: Patient Spontanous Breathing and Patient connected to nasal cannula oxygen  Post-op Assessment: Report given to RN and Post -op Vital signs reviewed and stable  Post vital signs: Reviewed and stable  Last Vitals:  Vitals:   10/22/16 1048 10/22/16 1543  BP: 136/75 106/73  Pulse: 61 67  Resp: 20 17  Temp: 36.7 C 36.2 C    Last Pain:  Vitals:   10/22/16 1543  TempSrc: Temporal  PainSc:       Patients Stated Pain Goal: 2 (99/35/70 1779)  Complications: No apparent anesthesia complications

## 2016-10-22 NOTE — H&P (Signed)
The patient has been re-examined, and the chart reviewed, and there have been no interval changes to the documented history and physical.    The risks, benefits, and alternatives have been discussed at length. The patient expressed understanding of the risks benefits and agreed with plans for surgical intervention.  James P. Hooten, Jr. M.D.    

## 2016-10-22 NOTE — Anesthesia Preprocedure Evaluation (Addendum)
Anesthesia Evaluation  Patient identified by MRN, date of birth, ID band Patient awake    Reviewed: Allergy & Precautions, NPO status , Patient's Chart, lab work & pertinent test results, reviewed documented beta blocker date and time   Airway Mallampati: III  TM Distance: >3 FB     Dental  (+) Chipped, Upper Dentures, Missing   Pulmonary shortness of breath, sleep apnea and Continuous Positive Airway Pressure Ventilation , COPD,  COPD inhaler, former smoker,           Cardiovascular hypertension, Pt. on medications + CAD, + Past MI, + CABG, + Peripheral Vascular Disease and +CHF  + dysrhythmias Atrial Fibrillation + pacemaker      Neuro/Psych PSYCHIATRIC DISORDERS Anxiety TIA   GI/Hepatic   Endo/Other    Renal/GU      Musculoskeletal  (+) Arthritis ,   Abdominal   Peds  Hematology   Anesthesia Other Findings Obese. CLL.EF55.PTSD. INR and PTT ok. eliquis d/c 4 days. Denies anesthesia compl.  Reproductive/Obstetrics                            Anesthesia Physical Anesthesia Plan  ASA: III  Anesthesia Plan: Spinal   Post-op Pain Management:    Induction:   Airway Management Planned:   Additional Equipment:   Intra-op Plan:   Post-operative Plan:   Informed Consent: I have reviewed the patients History and Physical, chart, labs and discussed the procedure including the risks, benefits and alternatives for the proposed anesthesia with the patient or authorized representative who has indicated his/her understanding and acceptance.     Plan Discussed with: CRNA  Anesthesia Plan Comments:         Anesthesia Quick Evaluation

## 2016-10-22 NOTE — Anesthesia Post-op Follow-up Note (Cosign Needed)
Anesthesia QCDR form completed.        

## 2016-10-22 NOTE — Op Note (Signed)
OPERATIVE NOTE  DATE OF SURGERY:  10/22/2016  PATIENT NAME:  [name]   DOB: 05/11/1946  MRN: [id number]  PRE-OPERATIVE DIAGNOSIS: Degenerative arthrosis of the right hip, primary  POST-OPERATIVE DIAGNOSIS:  Same  PROCEDURE:  Right total hip arthroplasty  SURGEON:  Marciano Sequin. M.D.  ASSISTANT:  Vance Peper, PA (present and scrubbed throughout the case, critical for assistance with exposure, retraction, instrumentation, and closure)  ANESTHESIA: spinal  ESTIMATED BLOOD LOSS: 100 mL  FLUIDS REPLACED: 1800 mL of crystalloid  DRAINS: 2 medium drains to a Hemovac reservoir  IMPLANTS UTILIZED: DePuy 15 mm small stature AML femoral stem, 58 mm OD Pinnacle Gription Sector acetabular component, +4 mm 10 Pinnacle Marathon polyethylene insert, and a 36 mm M-SPEC +1.5 mm hip ball  INDICATIONS FOR SURGERY: [name] is a 71 y.o. year old male with a long history of progressive hip and groin  pain. X-rays demonstrated severe degenerative changes. The patient had not seen any significant improvement despite conservative nonsurgical intervention. After discussion of the risks and benefits of surgical intervention, the patient expressed understanding of the risks benefits and agree with plans for total hip arthroplasty.   The risks, benefits, and alternatives were discussed at length including but not limited to the risks of infection, bleeding, nerve injury, stiffness, blood clots, the need for revision surgery, limb length inequality, dislocation, cardiopulmonary complications, among others, and they were willing to proceed.  PROCEDURE IN DETAIL: The patient was brought into the operating room and, after adequate spinal anesthesia was achieved, the patient was placed in a left lateral decubitus position. Axillary roll was placed and all bony prominences were well-padded. The patient's right hip was cleaned and prepped with alcohol and DuraPrep and draped in the usual sterile fashion. A  "timeout" was performed as per usual protocol. A lateral curvilinear incision was made gently curving towards the posterior superior iliac spine. The IT band was incised in line with the skin incision and the fibers of the gluteus maximus were split in line. The piriformis tendon was identified, skeletonized, and incised at its insertion to the proximal femur and reflected posteriorly. A T type posterior capsulotomy was performed. Prior to dislocation of the femoral head, a threaded Steinmann pin was inserted through a separate stab incision into the pelvis superior to the acetabulum and bent in the form of a stylus so as to assess limb length and hip offset throughout the procedure. The femoral head was then dislocated posteriorly. Inspection of the femoral head demonstrated severe degenerative changes with full-thickness loss of articular cartilage. The femoral neck cut was performed using an oscillating saw. The anterior capsule was elevated off of the femoral neck using a periosteal elevator. Attention was then directed to the acetabulum. The remnant of the labrum was excised using electrocautery. Inspection of the acetabulum also demonstrated significant degenerative changes. The acetabulum was reamed in sequential fashion up to a 57 mm diameter. Good punctate bleeding bone was encountered. A 58 mm Pinnacle Gription Sector acetabular component was positioned and impacted into place. Good scratch fit was appreciated. A neutral polyethylene trial was inserted.  Attention was then directed to the proximal femur. A hole for reaming of the proximal femoral canal was created using a high-speed burr. The femoral canal was reamed in sequential fashion up to a 14.5 mm diameter. This allowed for approximately 6 cm of scratch fit. It was thus elected to ream up to a 15 mm diameter so as to allow for a line  to line fit. Serial broaches were inserted up to a 15 mm small stature femoral broach. Calcar region was planed and  a trial reduction was performed using a 36 mm hip ball with a +1.5 mm neck length. Good stability was appreciated but was elected to trial the +4 mm 10 liner with the high side at the 8:00 position. Good equalization of limb lengths and hip offset was appreciated and excellent stability was noted both anteriorly and posteriorly. Trial components were removed. The acetabular shell was irrigated with copious amounts of normal saline with antibiotic solution and suctioned dry. A +4 mm 10 Pinnacle Marathon polyethylene insert was positioned and impacted into place. Next, a 15 mm small stature AML femoral stem was positioned and impacted into place. Excellent scratch fit was appreciated. A trial reduction was again performed with a 36 mm hip ball with a +1.5 mm neck length. Again, good equalization of limb lengths was appreciated and excellent stability appreciated both anteriorly and posteriorly. The hip was then dislocated and the trial hip ball was removed. The Morse taper was cleaned and dried. A 36 mm M-SPEC hip ball with a +1.5 mm neck length was placed on the trunnion and impacted into place. The hip was then reduced and placed through range of motion. Excellent stability was appreciated both anteriorly and posteriorly.  The wound was irrigated with copious amounts of normal saline with antibiotic solution and suctioned dry. Good hemostasis was appreciated. The posterior capsulotomy was repaired using #5 Ethibond. Piriformis tendon was reapproximated to the undersurface of the gluteus medius tendon using #5 Ethibond. Two medium drains were placed in the wound bed and brought out through separate stab incisions to be attached to a Hemovac reservoir. The IT band was reapproximated using interrupted sutures of #1 Vicryl. Subcutaneous tissue was approximated using first #0 Vicryl followed by #2-0 Vicryl. The skin was closed with skin staples.  The patient tolerated the procedure well and was transported to the  recovery room in stable condition.   Marciano Sequin., M.D.

## 2016-10-22 NOTE — Anesthesia Procedure Notes (Signed)
Date/Time: 10/22/2016 12:30 PM Performed by: Johnna Acosta Pre-anesthesia Checklist: Patient identified, Emergency Drugs available, Suction available, Patient being monitored and Timeout performed Patient Re-evaluated:Patient Re-evaluated prior to inductionOxygen Delivery Method: Simple face mask

## 2016-10-23 ENCOUNTER — Encounter: Payer: Self-pay | Admitting: Orthopedic Surgery

## 2016-10-23 LAB — BASIC METABOLIC PANEL
ANION GAP: 5 (ref 5–15)
BUN: 18 mg/dL (ref 6–20)
CHLORIDE: 107 mmol/L (ref 101–111)
CO2: 26 mmol/L (ref 22–32)
Calcium: 7.7 mg/dL — ABNORMAL LOW (ref 8.9–10.3)
Creatinine, Ser: 0.89 mg/dL (ref 0.61–1.24)
GFR calc Af Amer: >60 mL/min (ref >60)
GLUCOSE: 129 mg/dL — AB (ref 65–99)
POTASSIUM: 3.6 mmol/L (ref 3.5–5.1)
SODIUM: 138 mmol/L (ref 135–145)

## 2016-10-23 LAB — CBC
HCT: 31.4 % — ABNORMAL LOW (ref 40.0–52.0)
Hemoglobin: 10.9 g/dL — ABNORMAL LOW (ref 13.0–18.0)
MCH: 32.2 pg (ref 26.0–34.0)
MCHC: 34.8 g/dL (ref 32.0–36.0)
MCV: 92.4 fL (ref 80.0–100.0)
PLATELETS: 112 10*3/uL — AB (ref 150–440)
RBC: 3.39 MIL/uL — AB (ref 4.40–5.90)
RDW: 14.4 % (ref 11.5–14.5)
WBC: 9.1 10*3/uL (ref 3.8–10.6)

## 2016-10-23 NOTE — Progress Notes (Signed)
Subjective: 1 Day Post-Op Procedure(s) (LRB): TOTAL HIP ARTHROPLASTY (Right) Patient reports pain as 5 on 0-10 scale.   Patient is well, and has had no acute complaints or problems Continue with physical therapy today.  Plan is to go Home after hospital stay. no nausea and no vomiting Patient denies any chest pains or shortness of breath. Objective: Vital signs in last 24 hours: Temp:  [97.1 F (36.2 C)-98.1 F (36.7 C)] 97.9 F (36.6 C) (04/05 0433) Pulse Rate:  [56-67] 64 (04/05 0433) Resp:  [12-20] 20 (04/05 0433) BP: (93-136)/(49-75) 94/49 (04/05 0433) SpO2:  [95 %-100 %] 98 % (04/05 0433) Weight:  [104.3 kg (230 lb)] 104.3 kg (230 lb) (04/04 1048) well approximated incision Heels are non tender and elevated off the bed using rolled towels Intake/Output from previous day: 04/04 0701 - 04/05 0700 In: 3255 [P.O.:120; I.V.:2695; IV Piggyback:440] Out: 2115 [Urine:1825; Drains:190; Blood:100] Intake/Output this shift: No intake/output data recorded.   Recent Labs  10/23/16 0506  HGB 10.9*    Recent Labs  10/23/16 0506  WBC 9.1  RBC 3.39*  HCT 31.4*  PLT 112*    Recent Labs  10/23/16 0506  NA 138  K 3.6  CL 107  CO2 26  BUN 18  CREATININE 0.89  GLUCOSE 129*  CALCIUM 7.7*   No results for input(s): LABPT, INR in the last 72 hours.  EXAM General - Patient is Alert, Appropriate and Oriented Extremity - Neurologically intact Neurovascular intact Sensation intact distally Intact pulses distally No cellulitis present Compartment soft Dressing - dressing C/D/I Motor Function - intact, moving foot and toes well on exam.    Past Medical History:  Diagnosis Date  . Anxiety   . Arthritis   . Atrial fibrillation ([place])    a. Dx 2013, recurred 02/2014, CHA2DS2VASc = 3 -->placed on Eliquis;  b. 02/2014 Echo: EF 50-55%, mid and apical anterior septum and mid and apical inf septum are abnl, mild to mod Ao sclerosis w/o AS.  Marland Kitchen Chicken pox   . Chronic  lymphocytic leukemia ([place])    a. Dx 02/2014.  Marland Kitchen Complication of anesthesia    History of  PTSD--do not touch patient when waking up from surgery.  Marland Kitchen COPD (chronic obstructive pulmonary disease) ([place])   . Coronary artery disease    a. 04/2009 CABG x 3 (LIMA->LAD, VG->OM1, VG->PDA);  b. 09/2009 Cath: occluded VG x 2 w/ patent LIMA and L->R collats. EF 55%, mild antlat HK;  c. 10/2011 MV: EF 53%, no isch/infarct-->low risk.  Marland Kitchen History of chemotherapy 2015-2016  . HOH (hard of hearing)    Bilateral Hearing Aids  . Hypertension   . Myocardial infarction 2010  . OSA on CPAP    USE C-PAP  . Presence of permanent cardiac pacemaker   . PTSD (post-traumatic stress disorder)   . Pure hypercholesterolemia   . Rheumatic fever 1959    Assessment/Plan: 1 Day Post-Op Procedure(s) (LRB): TOTAL HIP ARTHROPLASTY (Right) Active Problems:   S/P total hip arthroplasty  Estimated body mass index is 34.97 kg/m as calculated from the following:   Height as of this encounter: 5\' 8"  (1.727 m).   Weight as of this encounter: 104.3 kg (230 lb). Advance diet Up with therapy D/C IV fluids Plan for discharge tomorrow Discharge home with home health  Labs: Were reviewed and acceptable. Hemoglobin 9.4 down from 14.2 DVT Prophylaxis - Foot Pumps, TED hose and Eliquis Weight-Bearing as tolerated to right leg D/C O2 and Pulse OX and  try on Room Air Begin working on [employer] tomorrow morning  Jillyn Ledger. Enola Eloy 10/23/2016, 7:11 AM

## 2016-10-23 NOTE — Progress Notes (Signed)
Clinical Social Worker (CSW) received SNF consult. PT is recommending home health. RN case manager aware of above. Please reconsult if future social work needs arise. CSW signing off.   Alexsandria Kivett, LCSW [phone] 

## 2016-10-23 NOTE — Progress Notes (Signed)
POD 1. Pt. Alert and oriented. VSS. Pain has been an issue overnight. MD paged and ordered dilaudid. 0.5 MG given x2 with full relief and pt. Slept til after 3. Pt. Was dangled at the bedside at this time and did great. IS at the bedside and pt. Encouraged to use it. Ice pack to right hip. Neurochecks WDL. Foley patent. Pt. Resting quietly at this time. Will continue to monitor.

## 2016-10-23 NOTE — Care Management Note (Signed)
Case Management Note  Patient Details  Name: [name] MRN: [id number] Date of Birth: [birth year]  Subjective/Objective:  POD # 1 right THA. Met with patient and wife at bedside. Patient slept throughout the discussion, I spoke with wife regarding discharge planning. Offered choice of home health agencies and wife chose [facility] since [facility] is out of network. He has a walker. No DME needed. Was on Eliquis prior to surgery. RNCM to clarify if he will discharge on this or Lovenox?                Action/Plan: [facility] for PT. It is anticipated patient will be discharged home tomorrow  Expected Discharge Date:                  Expected Discharge Plan:  [place]  In-House Referral:     Discharge planning Services  CM Consult  Post Acute Care Choice:  Home Health Choice offered to:  Spouse  DME Arranged:    DME Agency:     HH Arranged:  PT [place]:  [facility]  Status of Service:  In process, will continue to follow  If discussed at Long Length of Stay Meetings, dates discussed:    Additional Comments:  Jolly Mango, RN 10/23/2016, 3:19 PM

## 2016-10-23 NOTE — Discharge Instructions (Signed)
° °POSTERIOR TOTAL HIP REPLACEMENT POSTOPERATIVE DIRECTIONS ° °Hip Rehabilitation, Guidelines Following Surgery  °The results of a hip operation are greatly improved after range of motion and muscle strengthening exercises. Follow all safety measures which are given to protect your hip. If any of these exercises cause increased pain or swelling in your joint, decrease the amount until you are comfortable again. Then slowly increase the exercises. Call your caregiver if you have problems or questions.  ° °HOME CARE INSTRUCTIONS  °Remove items at home which could result in a fall. This includes throw rugs or furniture in walking pathways.  °· ICE to the affected hip every three hours for 30 minutes at a time and then as needed for pain and swelling.  Continue to use ice on the hip for pain and swelling from surgery. You may notice swelling that will progress down to the foot and ankle.  This is normal after surgery.  Elevate the leg when you are not up walking on it.   °· Continue to use the breathing machine which will help keep your temperature down.  It is common for your temperature to cycle up and down following surgery, especially at night when you are not up moving around and exerting yourself.  The breathing machine keeps your lungs expanded and your temperature down. ° °DIET °You may resume your previous home diet once your are discharged from the hospital. ° °DRESSING / WOUND  CARE / SHOWERING ° °DO NOT GET THE INCISION WET °You may start showering once staples have been removed at 2 weeks. Change dressing as needed. Do not submerge the incision in water such as a bath tub, swimming pool or hot tubs until the incision is completely healed which is approximately 4 weeks. ° °STAPLE REMOVAL: °Please remove staple at 2 weeks post op and apply benzoin and 1/2 inch steri strips ° °ACTIVITY °Walk with your walker as instructed. °Use walker as long as suggested by your caregivers.May go to using a cane once your  therapist feels that it is safe to do so. °Avoid periods of inactivity such as sitting longer than an hour when not asleep. This helps prevent blood clots.  °You may resume a sexual relationship in one month or when given the OK by your doctor.  °You may return to work once you are cleared by your doctor.  °Do not drive a car for 6 weeks or until released by you surgeon.  °Do not drive while taking narcotics. ° ° °WEIGHT BEARING °You may wait bear as tolerated on the surgical leg. ° °POSTOPERATIVE CONSTIPATION PROTOCOL °Constipation - defined medically as fewer than three stools per week and severe constipation as less than one stool per week. ° °One of the most common issues patients have following surgery is constipation.  Even if you have a regular bowel pattern at home, your normal regimen is likely to be disrupted due to multiple reasons following surgery.  Combination of anesthesia, postoperative narcotics, change in appetite and fluid intake all can affect your bowels.  In order to avoid complications following surgery, here are some recommendations in order to help you during your recovery period. ° °Colace (docusate) - Pick up an over-the-counter form of Colace or another stool softener and take twice a day as long as you are requiring postoperative pain medications.  Take with a full glass of water daily.  If you experience loose stools or diarrhea, hold the colace until you stool forms back up.  If your symptoms   do not get better within 1 week or if they get worse, check with your doctor. ° °Dulcolax (bisacodyl) - Pick up over-the-counter and take as directed by the product packaging as needed to assist with the movement of your bowels.  Take with a full glass of water.  Use this product as needed if not relieved by Colace only.  ° °MiraLax (polyethylene glycol) - Pick up over-the-counter to have on hand.  MiraLax is a solution that will increase the amount of water in your bowels to assist with bowel  movements.  Take as directed and can mix with a glass of water, juice, soda, coffee, or tea.  Take if you go more than two days without a movement. °Do not use MiraLax more than once per day. Call your doctor if you are still constipated or irregular after using this medication for 7 days in a row. ° °If you continue to have problems with postoperative constipation, please contact the office for further assistance and recommendations.  If you experience "the worst abdominal pain ever" or develop nausea or vomiting, please contact the office immediatly for further recommendations for treatment. ° °ITCHING ° If you experience itching with your medications, try taking only a single pain pill, or even half a pain pill at a time.  You can also use Benadryl over the counter for itching or also to help with sleep.  ° °TED HOSE STOCKINGS °Wear the elastic stockings on both legs. If you go home, you may remove these at night but will need to put them on the first thing in the morning. If you go to rehab, then you may remove them one hour per 8 hour shift. This is because in rehab you are not as active as you are at home. ° °MEDICATIONS °See your medication summary on the “After Visit Summary” that the nursing staff will review with you prior to discharge.  You may have some home medications which will be placed on hold until you complete the course of blood thinner medication.  It is important for you to complete the blood thinner medication as prescribed by your surgeon.  Continue your approved medications as instructed at time of discharge. ° °PRECAUTIONS °If you experience chest pain or shortness of breath - call 911 immediately for transfer to the hospital emergency department.  °If you develop a fever greater that 101 F, purulent drainage from wound, increased redness or drainage from wound, foul odor from the wound/dressing, or calf pain - CONTACT YOUR SURGEON.   °                                                 °FOLLOW-UP APPOINTMENTS °Make sure you keep all of your appointments after your operation with your surgeon and caregivers. You should call the office at the above phone number and make an appointment for approximately 6 weeks after the date of your surgery or on the date instructed by your surgeon outlined in the "After Visit Summary". This appointment should have already been made prior to the surgery. If you don't remember the date and time, call the office at [phone]. ° °RANGE OF MOTION AND STRENGTHENING EXERCISES  °These exercises are designed to help you keep full movement of your hip joint. Follow your caregiver's or physical therapist's instructions. Perform all exercises about fifteen times, three times per   day or as directed. Exercise both hips, even if you have had only one joint replacement. These exercises can be done on a training (exercise) mat, on the floor, on a table or on a bed. Use whatever works the best and is most comfortable for you. Use music or television while you are exercising so that the exercises are a pleasant break in your day. This will make your life better with the exercises acting as a break in routine you can look forward to.  °Lying on your back, slowly slide your foot toward your buttocks, raising your knee up off the floor. Then slowly slide your foot back down until your leg is straight again.  °Lying on your back spread your legs as far apart as you can without causing discomfort.  °Lying on your side, raise your upper leg and foot straight up from the floor as far as is comfortable. Slowly lower the leg and repeat.  °Lying on your back, tighten up the muscle in the front of your thigh (quadriceps muscles). You can do this by keeping your leg straight and trying to raise your heel off the floor. This helps strengthen the largest muscle supporting your knee.  °Lying on your back, tighten up the muscles of your buttocks both with the legs straight and with the knee bent  at a comfortable angle while keeping your heel on the floor.  ° °DON'T FORGET THE 4 POSTERIOR HIP PRECAUTIONS ° ° °IF YOU ARE TRANSFERRED TO A SKILLED REHAB FACILITY °If the patient is transferred to a skilled rehab facility following release from the hospital, a list of the current medications will be sent to the facility for the patient to continue.  When discharged from the skilled rehab facility, please have the facility set up the patient's Home Health Physical Therapy prior to being released. Also, the skilled facility will be responsible for providing the patient with their medications at time of release from the facility to include their pain medication, the muscle relaxants, and their blood thinner medication. If the patient is still at the rehab facility at time of the two week follow up appointment, the skilled rehab facility will also need to assist the patient in arranging follow up appointment in our office and any transportation needs. ° °MAKE SURE YOU:  °Understand these instructions.  °Get help right away if you are not doing well or get worse.  ° ° °Pick up stool softner and laxative for home use following surgery while on pain medications. °Continue to use ice for pain and swelling after surgery. °Do not use any lotions or creams on the incision until instructed by your surgeon. ° °

## 2016-10-23 NOTE — Progress Notes (Signed)
qPhysical Therapy Treatment Patient Details Name: [name] MRN: [id number] DOB: 07-Jan-1946 Today's Date: 10/23/2016    History of Present Illness 71 y/o male here s/p R total hip replacemnt (posterior approach 10/22/16). POD#1 for OT evaluation. PMHx includes anxiety, A-fib, arthritis, chronic lymphocystic leukemia (2015), COPD, CAD, HTN, MI (2010), OSA w/ CPAP, permanent cardiac pacemaker, PTSD, hypercholesteremia, and rheumatic fever.     PT Comments    Pt continues to do very well with PT and showed great motivation, strength, comfort with ambulation/steps and generally is progessing very well.  He reports moderate pain (4-5/10 t/o session) that was not limiting though he did have some lethargy likely secondary to meds.  He was able to do 10 SLRs, push through considerable resistance with most exercises and walked w/o hesitation, limp or excessive use of the walker. Pt doing well, should be able to home tomorrow with family.    Follow Up Recommendations  Home health PT     Equipment Recommendations       Recommendations for Other Services       Precautions / Restrictions Precautions Precautions: Posterior Hip;Fall Restrictions Weight Bearing Restrictions: Yes RLE Weight Bearing: Weight bearing as tolerated    Mobility  Bed Mobility Overal bed mobility: Modified Independent             General bed mobility comments: Pt able to get in/out of bed w/o direct assist, cuing for precautions and sequencing  Transfers Overall transfer level: Modified independent Equipment used: Rolling walker (2 wheeled)             General transfer comment: Pt able to rise to standing w/o direct assist, reminders for positioning and UE use.   Ambulation/Gait Ambulation/Gait assistance: Min guard Ambulation Distance (Feet): 150 Feet Assistive device: Rolling walker (2 wheeled)       General Gait Details: Pt was able to take weight through R LE, maintain consistent cadence.  Good  speed and confidence   Stairs Stairs: Yes   Stair Management: Two rails Number of Stairs: 4 General stair comments: Pt did well and needed only initial cuing for appropriate strategy   Wheelchair Mobility    Modified Rankin (Stroke Patients Only)       Balance Overall balance assessment: Modified Independent                                          Cognition Arousal/Alertness: Suspect due to medications;Lethargic Behavior During Therapy: WFL for tasks assessed/performed Overall Cognitive Status: Within Functional Limits for tasks assessed                                        Exercises General Exercises - Lower Extremity Ankle Circles/Pumps: AROM;10 reps Quad Sets: Strengthening;10 reps Gluteal Sets: Strengthening;10 reps Short Arc Quad: Strengthening;15 reps Heel Slides: Strengthening;10 reps Hip ABduction/ADduction: Strengthening;10 reps Straight Leg Raises: 10 reps;AROM Other Exercises Other Exercises: pt/dtr/spouse educated in home set up including bathroom with BSC over toilet, shower chair placement, and compression stocking mgt to support safe return home    General Comments        Pertinent Vitals/Pain Pain Assessment: 0-10 Pain Score: 4  Pain Location: R hip Pain Descriptors / Indicators: Aching Pain Intervention(s): Limited activity within patient's tolerance;Monitored during session    Home Living Family/patient  expects to be discharged to:: Private residence Living Arrangements: Spouse/significant other Available Help at Discharge: Family;Friend(s);Available 24 hours/day;Available PRN/intermittently Type of Home: House Home Access: Stairs to enter Entrance Stairs-Rails: Can reach both Home Layout: One level Home Equipment: Cane - single point;Bedside commode;Adaptive equipment      Prior Function Level of Independence: Independent with assistive device(s)      Comments: Using cane more recently due to hip  pain; uses AE for LB dressing at baseline due to hip pain; indep with all other ADL, driving, caring for farm animals, med mgt; reports "a couple" falls in past 12 months (contributes to hip pain), wearing bilateral hearing aids   PT Goals (current goals can now be found in the care plan section) Acute Rehab PT Goals Patient Stated Goal: go home Progress towards PT goals: Progressing toward goals    Frequency    BID      PT Plan Current plan remains appropriate    Co-evaluation             End of Session Equipment Utilized During Treatment: Gait belt Activity Tolerance: Patient tolerated treatment well Patient left: with chair alarm set;with call bell/phone within reach;with family/visitor present Nurse Communication: Mobility status PT Visit Diagnosis: Muscle weakness (generalized) (M62.81);Difficulty in walking, not elsewhere classified (R26.2)     Time: 5188-4166 PT Time Calculation (min) (ACUTE ONLY): 25 min  Charges:  $Gait Training: 8-22 mins $Therapeutic Exercise: 8-22 mins                    G Codes:       Kreg Shropshire, DPT 10/23/2016, 4:28 PM

## 2016-10-23 NOTE — Evaluation (Signed)
Occupational Therapy Evaluation Patient Details Name: [name] MRN: [id number] DOB: [birth year] Today's Date: 10/23/2016    History of Present Illness 71 y/o male here s/p R total hip replacemnt (posterior approach 10/22/16). POD#1 for OT evaluation. PMHx includes anxiety, A-fib, arthritis, chronic lymphocystic leukemia (2015), COPD, CAD, HTN, MI (2010), OSA w/ CPAP, permanent cardiac pacemaker, PTSD, hypercholesteremia, and rheumatic fever.    Clinical Impression   Pt seen for OT evaluation this date. Pt was independent and active prior to surgical replacement of R hip. Pt presents with decreased strength/ROM, posterior hip precautions and decreased recall of precautions (able to recall 1/3), decreased activity tolerance, increased falls risk, and need for increased assist with self care tasks. Pt/family educated in home/bathroom set up and AE for LB dressing. Pt would benefit from additional skilled OT services to address noted impairments and functional deficits including AE for self care tasks, energy conservation strategies, and home/routines modifications to support maximal return to PLOF and minimize future falls risk and rehospitalization.    Follow Up Recommendations  Home health OT    Equipment Recommendations  None recommended by OT    Recommendations for Other Services       Precautions / Restrictions Precautions Precautions: Posterior Hip;Fall Restrictions Weight Bearing Restrictions: Yes RLE Weight Bearing: Weight bearing as tolerated      Mobility Bed Mobility             General bed mobility comments: pt declined out of bed activity due to fatigue after PT session w/ stairs, just received medications making him groggy  Transfers             General transfer comment: pt declined out of bed activity due to fatigue after PT session w/ stairs, just received medications making him groggy    Balance                                          ADL either performed or assessed with clinical judgement   ADL Overall ADL's : Needs assistance/impaired             Lower Body Bathing: Minimal assistance;Sit to/from stand;Sitting/lateral leans   Upper Body Dressing : Set up;Sitting   Lower Body Dressing: Minimal assistance;With adaptive equipment;Sit to/from stand;Cueing for compensatory techniques Lower Body Dressing Details (indicate cue type and reason): pt/spouse/dtr educated in use of AE for LB dressing of underwear and pants with verbal and visual demo instructions, pt agreeable to trialing tomorrow due to fatigue this date   Toilet Transfer Details (indicate cue type and reason): deferred due to pt fatigue           General ADL Comments: pt generally min assist for LB ADL tasks, would benefit from additional training in AE for LB dressing tasks      Vision Baseline Vision/History: Wears glasses Wears Glasses: At all times Patient Visual Report: No change from baseline Vision Assessment?: No apparent visual deficits     Perception     Praxis Praxis Praxis tested?: Within functional limits    Pertinent Vitals/Pain Pain Assessment: 0-10 Pain Score: 3  Pain Location: R hip Pain Descriptors / Indicators: Aching Pain Intervention(s): Limited activity within patient's tolerance;Monitored during session     Hand Dominance     Extremity/Trunk Assessment Upper Extremity Assessment Upper Extremity Assessment: Overall WFL for tasks assessed   Lower Extremity Assessment Lower Extremity Assessment: Defer  to PT evaluation;RLE deficits/detail RLE Deficits / Details: Pt with good effort with all R LE testing, showed ability to do SLRs and was able to push against light resistance in all planes       Communication Communication Communication: No difficulties   Cognition Arousal/Alertness: Suspect due to medications (fatigued after PT, groggy due to medications but able to participate) Behavior During Therapy:  WFL for tasks assessed/performed Overall Cognitive Status: Within Functional Limits for tasks assessed                                     General Comments       Exercises Other Exercises Other Exercises: pt/dtr/spouse educated in home set up including bathroom with BSC over toilet, shower chair placement, and compression stocking mgt to support safe return home   Shoulder Instructions      Home Living Family/patient expects to be discharged to:: Private residence Living Arrangements: Spouse/significant other Available Help at Discharge: Family;Friend(s);Available 24 hours/day;Available PRN/intermittently Type of Home: House Home Access: Stairs to enter [employer] of Steps: 7 Entrance Stairs-Rails: Can reach both Home Layout: One level     Bathroom Shower/Tub: Walk-in shower;Door   [employer] Toilet: Standard Bathroom Accessibility: Yes How Accessible: Accessible via walker Home Equipment: Cane - single point;Bedside commode;Adaptive equipment Adaptive Equipment: Reacher;Sock aid        Prior Functioning/Environment Level of Independence: Independent with assistive device(s)        Comments: Using cane more recently due to hip pain; uses AE for LB dressing at baseline due to hip pain; indep with all other ADL, driving, caring for farm animals, med mgt; reports "a couple" falls in past 12 months (contributes to hip pain), wearing bilateral hearing aids        OT Problem List: Decreased strength;Decreased coordination;Pain;Decreased range of motion;Decreased safety awareness;Decreased activity tolerance;Decreased knowledge of use of DME or AE;Decreased knowledge of precautions      OT Treatment/Interventions: Self-care/ADL training;Therapeutic exercise;Therapeutic activities;Energy conservation;DME and/or AE instruction;Patient/family education    OT Goals(Current goals can be found in the care plan section) Acute Rehab OT Goals Patient Stated  Goal: go home OT Goal Formulation: With patient/family Time For Goal Achievement: 11/06/16 Potential to Achieve Goals: Good  OT Frequency: Min 1X/week   Barriers to D/C:            Co-evaluation              End of Session    Activity Tolerance: Patient limited by fatigue Patient left: in bed;with call bell/phone within reach;with bed alarm set;with family/visitor present  OT Visit Diagnosis: Other abnormalities of gait and mobility (R26.89);Muscle weakness (generalized) (M62.81);Repeated falls (R29.6);History of falling (Z91.81);Pain Pain - Right/Left: Right Pain - part of body: Hip                Time: 1856-3149 OT Time Calculation (min): 25 min Charges:  OT General Charges $OT Visit: 1 Procedure OT Evaluation $OT Eval Low Complexity: 1 Procedure OT Treatments $Self Care/Home Management : 8-22 mins G-Codes:     Jeni Salles, MPH, MS, OTR/L ascom [phone] 10/23/16, 2:28 PM

## 2016-10-23 NOTE — Progress Notes (Signed)
Pt. c/o pain level 10 out of 10. Dr. Rudene Christians notified and put in new orders for pain meds.

## 2016-10-23 NOTE — Progress Notes (Signed)
Foley d/c'd at 0600 

## 2016-10-23 NOTE — Anesthesia Postprocedure Evaluation (Signed)
Anesthesia Post Note  Patient: [name]  Procedure(s) Performed: Procedure(s) (LRB): TOTAL HIP ARTHROPLASTY (Right)  Patient location during evaluation: Nursing Unit Anesthesia Type: Spinal Level of consciousness: oriented and awake and alert Pain management: pain level controlled Vital Signs Assessment: post-procedure vital signs reviewed and stable Respiratory status: spontaneous breathing, respiratory function stable and patient connected to nasal cannula oxygen Cardiovascular status: blood pressure returned to baseline and stable Postop Assessment: no headache, no backache and no signs of nausea or vomiting Anesthetic complications: no     Last Vitals:  Vitals:   10/22/16 2325 10/23/16 0433  BP: 124/70 (!) 94/49  Pulse: (!) 56 64  Resp: 18 20  Temp: 36.6 C 36.6 C    Last Pain:  Vitals:   10/23/16 0620  TempSrc:   PainSc: Asleep                 Estill Batten

## 2016-10-23 NOTE — Progress Notes (Signed)
0949: BP 73/42; HR 59  Recheck at 1030: 77/53; HR 60  Pt is oriented and arrousable. Family at bedside. Pt did get 10 mg Oxycodone earlier during PT. Pt skin warm, dry, and has appropriate color.  S/w Rogers Blocker, PA. We are holding his heart meds (except his eliquis) and continuing to monitor. I am to call back Rogers Blocker if his BP remains low.  Loleta Dicker, RN

## 2016-10-23 NOTE — Discharge Summary (Signed)
Physician Discharge Summary  Patient ID: [name] MRN: [id number] DOB/AGE: 1946-01-02 71 y.o.  Admit date: 10/22/2016 Discharge date: 10/24/2016  Admission Diagnoses:  primary osteoarthritis of right hip   Discharge Diagnoses: Patient Active Problem List   Diagnosis Date Noted  . S/P total hip arthroplasty 10/22/2016  . Sick sinus syndrome ([place]) 07/08/2016  . Annual physical exam 07/08/2016  . Hip osteoarthritis 07/08/2016  . Chronic fatigue   . 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 PAD (peripheral artery disease) ([place])   . OSA on CPAP   . TIA (transient ischemic attack) 11/02/2015  . Chronic diastolic CHF (congestive heart failure) ([place]) 09/20/2015  . CLL (chronic lymphocytic leukemia) ([place])   . PTSD (post-traumatic stress disorder)   . Osteoarthritis of both knees 07/05/2015  . COPD (chronic obstructive pulmonary disease) ([place]) 01/01/2012  . Shortness of breath 10/09/2011  . Paroxysmal atrial fibrillation ([place]) 10/09/2011  . Hyperlipidemia 11/28/2009  . Essential hypertension 11/28/2009    Past Medical History:  Diagnosis Date  . Anxiety   . Arthritis   . Atrial fibrillation ([place])    a. Dx 2013, recurred 02/2014, CHA2DS2VASc = 3 -->placed on Eliquis;  b. 02/2014 Echo: EF 50-55%, mid and apical anterior septum and mid and apical inf septum are abnl, mild to mod Ao sclerosis w/o AS.  Marland Kitchen Chicken pox   . Chronic lymphocytic leukemia ([place])    a. Dx 02/2014.  Marland Kitchen Complication of anesthesia    History of  PTSD--do not touch patient when waking up from surgery.  Marland Kitchen COPD (chronic obstructive pulmonary disease) ([place])   . Coronary artery disease    a. 04/2009 CABG x 3 (LIMA->LAD, VG->OM1, VG->PDA);  b. 09/2009 Cath: occluded VG x 2 w/ patent LIMA and L->R collats. EF 55%, mild antlat HK;  c. 10/2011 MV: EF 53%, no isch/infarct-->low risk.  Marland Kitchen History of chemotherapy 2015-2016  . HOH (hard of hearing)    Bilateral Hearing Aids  . Hypertension   .  Myocardial infarction 2010  . OSA on CPAP    USE C-PAP  . Presence of permanent cardiac pacemaker   . PTSD (post-traumatic stress disorder)   . Pure hypercholesterolemia   . Rheumatic fever 1959     Transfusion: No transfusions during this admission   Consultants (if any):  none  Discharged Condition: Improved  Hospital Course: BELDON NOWLING is an 71 y.o. male who was admitted 10/22/2016 with a diagnosis of degenerative arthrosis right hip and went to the operating room on 10/22/2016 and underwent the above named procedures.    Surgeries:Procedure(s): TOTAL HIP ARTHROPLASTY on 10/22/2016  PRE-OPERATIVE DIAGNOSIS: Degenerative arthrosis of the right hip, primary  POST-OPERATIVE DIAGNOSIS:  Same  PROCEDURE:  Right total hip arthroplasty  SURGEON:  Marciano Sequin. M.D.  ASSISTANT:  Vance Peper, PA (present and scrubbed throughout the case, critical for assistance with exposure, retraction, instrumentation, and closure)  ANESTHESIA: spinal  ESTIMATED BLOOD LOSS: 100 mL  FLUIDS REPLACED: 1800 mL of crystalloid  DRAINS: 2 medium drains to a Hemovac reservoir  IMPLANTS UTILIZED: DePuy 15 mm small stature AML femoral stem, 58 mm OD [employer] acetabular component, +4 mm 10 [employer] polyethylene insert, and a 36 mm M-SPEC +1.5 mm hip ball  INDICATIONS FOR SURGERY: [name] is a 71 y.o. year old male with a long history of progressive hip and groin  pain. X-rays demonstrated severe degenerative changes. The patient had not seen any significant  improvement despite conservative nonsurgical intervention. After discussion of the risks and benefits of surgical intervention, the patient expressed understanding of the risks benefits and agree with plans for total hip arthroplasty.   The risks, benefits, and alternatives were discussed at length including but not limited to the risks of infection, bleeding, nerve injury, stiffness, blood clots, the need  for revision surgery, limb length inequality, dislocation, cardiopulmonary complications, among others, and they were willing to proceed. Patient tolerated the surgery well. No complications .Patient was taken to PACU where she was stabilized and then transferred to the orthopedic floor.  Patient started on Eliquis 5 mg twice a day. Foot pumps applied bilaterally at 80 mm hgb. Heels elevated off bed with rolled towels. No evidence of DVT. Calves non tender. Negative [place]oman. Physical therapy started on day #1 for gait training and transfer with OT starting on  day #1 for ADL and assisted devices. Patient has done well with therapy. Ambulated greater than 200 feet upon being discharged. Was able to ascend and descend 4 steps safely and independently  Patient's IV and Foley were discontinued on day #1 with Hemovac being discontinued on day #2. Dressing changed on day 2 prior to patient being discharged   He was given perioperative antibiotics:  Anti-infectives    Start     Dose/Rate Route Frequency Ordered Stop   10/22/16 1800  ceFAZolin (ANCEF) IVPB 2 g/50 mL premix  Status:  Discontinued     2 g 100 mL/hr over 30 Minutes Intravenous Every 6 hours 10/22/16 1650 10/22/16 1719   10/22/16 1800  ceFAZolin (ANCEF) 2 g in dextrose 5 % 100 mL IVPB     2 g 240 mL/hr over 30 Minutes Intravenous Every 6 hours 10/22/16 1719 10/23/16 1759   10/22/16 1645  ceFAZolin (ANCEF) IVPB 2g/100 mL premix  Status:  Discontinued     2 g 200 mL/hr over 30 Minutes Intravenous Every 6 hours 10/22/16 1641 10/22/16 1650   10/22/16 0846  ceFAZolin (ANCEF) 2-4 GM/100ML-% IVPB    Comments:  register, karen: cabinet override      10/22/16 0846 10/22/16 2059   10/22/16 0600  ceFAZolin (ANCEF) IVPB 2g/100 mL premix  Status:  Discontinued     2 g 200 mL/hr over 30 Minutes Intravenous On call to O.R. 10/21/16 2209 10/22/16 7591    .  He was Fitted with AV 1 compression foot pump devices, instructed on heel pumps, early  ambulation, and TED stockings bilaterally for DVT prophylaxis.  He benefited maximally from the hospital stay and there were no complications.    Recent vital signs:  Vitals:   10/22/16 2325 10/23/16 0433  BP: 124/70 (!) 94/49  Pulse: (!) 56 64  Resp: 18 20  Temp: 97.9 F (36.6 C) 97.9 F (36.6 C)    Recent laboratory studies:  Lab Results  Component Value Date   HGB 10.9 (L) 10/23/2016   HGB 14.2 10/15/2016   HGB 13.0 08/11/2016   Lab Results  Component Value Date   WBC 9.1 10/23/2016   PLT 112 (L) 10/23/2016   Lab Results  Component Value Date   INR 1.16 10/15/2016   Lab Results  Component Value Date   NA 138 10/23/2016   K 3.6 10/23/2016   CL 107 10/23/2016   CO2 26 10/23/2016   BUN 18 10/23/2016   CREATININE 0.89 10/23/2016   GLUCOSE 129 (H) 10/23/2016    Discharge Medications:   Allergies as of 10/24/2016   No Known Allergies  Medication List    TAKE these medications   acetaminophen 500 MG tablet Commonly known as:  TYLENOL Take 1,000 mg by mouth every 8 (eight) hours as needed for mild pain.   albuterol 108 (90 Base) MCG/ACT inhaler Commonly known as:  PROVENTIL HFA;VENTOLIN HFA Inhale 2 puffs into the lungs every 6 (six) hours as needed for wheezing or shortness of breath.   amiodarone 200 MG tablet Commonly known as:  PACERONE Take 1 tablet (200 mg total) by mouth daily. What changed:  how much to take  when to take this   amLODipine 10 MG tablet Commonly known as:  NORVASC Take 5 mg by mouth daily. Takes 0.5 tablet   apixaban 5 MG Tabs tablet Commonly known as:  ELIQUIS Take 1 tablet (5 mg total) by mouth 2 (two) times daily.   atorvastatin 80 MG tablet Commonly known as:  LIPITOR Take 1 tablet (80 mg total) by mouth at bedtime.   budesonide-formoterol 160-4.5 MCG/ACT inhaler Commonly known as:  SYMBICORT Inhale 2 puffs into the lungs 2 (two) times daily.   cetirizine 10 MG tablet Commonly known as:  ZYRTEC Take 10 mg by  mouth daily.   Co Q-10 100 MG Caps Take 200 mg by mouth at bedtime.   furosemide 20 MG tablet Commonly known as:  LASIX Take 20 mg by mouth 2 (two) times daily as needed for fluid (takes with metolazone  for fluid retention).   isosorbide mononitrate 30 MG 24 hr tablet Commonly known as:  IMDUR Take 1 tablet (30 mg total) by mouth daily.   Krill Oil 350 MG Caps Take 350 mg by mouth every evening.   metolazone 5 MG tablet Commonly known as:  ZAROXOLYN Take 5 mg by mouth 2 (two) times daily as needed. Take with furosemide as needed for fluid retention   metoprolol tartrate 25 MG tablet Commonly known as:  LOPRESSOR Take 1 tablet (25 mg total) by mouth 2 (two) times daily.   mirtazapine 15 MG tablet Commonly known as:  REMERON Take 15 mg by mouth at bedtime as needed.   multivitamin with minerals Tabs tablet Take 1 tablet by mouth daily.   nitroGLYCERIN 0.4 MG SL tablet Commonly known as:  NITROSTAT Place 1 tablet (0.4 mg total) under the tongue every 5 (five) minutes as needed for chest pain.   oxyCODONE 5 MG immediate release tablet Commonly known as:  Oxy IR/ROXICODONE Take 1-2 tablets (5-10 mg total) by mouth every 4 (four) hours as needed for severe pain.   tiotropium 18 MCG inhalation capsule Commonly known as:  SPIRIVA Place 18 mcg into inhaler and inhale at bedtime.   traMADol 50 MG tablet Commonly known as:  ULTRAM Take 1 tablet (50 mg total) by mouth every 6 (six) hours as needed for moderate pain. What changed:  when to take this            Durable Medical Equipment        Start     Ordered   10/22/16 1642  DME Walker rolling  Once    Question:  Patient needs a walker to treat with the following condition  Answer:  S/P total hip arthroplasty   10/22/16 1641   10/22/16 1642  DME Bedside commode  Once    Question:  Patient needs a bedside commode to treat with the following condition  Answer:  S/P total hip arthroplasty   10/22/16 1641       Diagnostic Studies: Dg Hip Port Unilat  With Pelvis 1v Right  Result Date: 10/22/2016 CLINICAL DATA:  Right hip arthroplasty EXAM: DG HIP (WITH OR WITHOUT PELVIS) 1V PORT RIGHT COMPARISON:  03/31/2016 FINDINGS: New total right hip arthroplasty that is located. No periprosthetic fracture. Surgical drain and soft tissue gas noted. IMPRESSION: Total right hip arthroplasty without acute finding. Electronically Signed   By: Monte Fantasia M.D.   On: 10/22/2016 16:16    Disposition: 01-Home or Self Care    Follow-up Information    Dereck Leep, MD On 12/25/2016.   Specialty:  Orthopedic Surgery Why:  at 9:45am Contact information: [place] [address] [phone]            Signed: Watt Climes. 10/23/2016, 7:19 AM

## 2016-10-23 NOTE — Evaluation (Signed)
Physical Therapy Evaluation Patient Details Name: [name] MRN: [id number] DOB: 1946/03/12 Today's Date: 10/23/2016   History of Present Illness  71 y/o male here s/p R total hip replacemnt (posterior approach 10/22/16)  Clinical Impression  Pt did well with PT exam and was eager to work hard t/o the session. He had some minimal pain that did not effect his ability to participate.  Pt was able to get to EOB and get to standing w/o physical assist, showed ability to do some AROM SLRs after brief warm up, walked with good safety and relative consistency into the hallway and generally did quite well.  Pt will need continued PT to work back to PLOF.     Follow Up Recommendations Home health PT    Equipment Recommendations   (has walker)    Recommendations for Other Services       Precautions / Restrictions Precautions Precautions: Posterior Hip;Fall Restrictions Weight Bearing Restrictions: Yes RLE Weight Bearing: Weight bearing as tolerated      Mobility  Bed Mobility Overal bed mobility: Modified Independent             General bed mobility comments: Pt did well getting himself to EOB, able to use UEs to get to sitting upright  Transfers Overall transfer level: Modified independent Equipment used: Rolling walker (2 wheeled)             General transfer comment: Pt able to rise to standing w/o assit and showed good overall confidence and safety  Ambulation/Gait Ambulation/Gait assistance: Min guard Ambulation Distance (Feet): 50 Feet Assistive device: Rolling walker (2 wheeled)       General Gait Details: Pt was able to confidently take weight through R LE and though he had some fatigue with the effort was ultimately safe and able to use walker effectively.  Stairs            Wheelchair Mobility    Modified Rankin (Stroke Patients Only)       Balance Overall balance assessment: Modified Independent                                           Pertinent Vitals/Pain Pain Assessment: 0-10 Pain Score: 3  (increased to 5/10 post activity) Pain Location: R hip    Home Living Family/patient expects to be discharged to:: Private residence Living Arrangements: Spouse/significant other Available Help at Discharge: Family;Friend(s) Type of Home: House Home Access: Stairs to enter Entrance Stairs-Rails: Can reach both Entrance Stairs-Number of Steps: 7 Home Layout: One level Home Equipment: Cane - single point      Prior Function Level of Independence: Independent         Comments: Pt has had to use the cane more recently, otherwise he is active     Hand Dominance        Extremity/Trunk Assessment   Upper Extremity Assessment Upper Extremity Assessment: Overall WFL for tasks assessed    Lower Extremity Assessment Lower Extremity Assessment: Overall WFL for tasks assessed;RLE deficits/detail RLE Deficits / Details: Pt with good effort with all R LE testing, showed ability to do SLRs and was able to push against light resistance in all planes       Communication   Communication: No difficulties  Cognition Arousal/Alertness: Awake/alert Behavior During Therapy: WFL for tasks assessed/performed Overall Cognitive Status: Within Functional Limits for tasks assessed  General Comments      Exercises General Exercises - Lower Extremity Ankle Circles/Pumps: AROM;10 reps Quad Sets: Strengthening;10 reps Gluteal Sets: Strengthening;10 reps Short Arc Quad: Strengthening;10 reps Heel Slides: Strengthening;10 reps Hip ABduction/ADduction: Strengthening;10 reps Straight Leg Raises: AROM;5 reps   Assessment/Plan    PT Assessment Patient needs continued PT services  PT Problem List Decreased balance;Decreased range of motion;Decreased strength;Decreased activity tolerance;Decreased mobility;Decreased knowledge of use of DME;Decreased safety  awareness;Pain;Decreased knowledge of precautions       PT Treatment Interventions DME instruction;Gait training;Stair training;Functional mobility training;Therapeutic exercise;Therapeutic activities;Balance training;Neuromuscular re-education;Patient/family education    PT Goals (Current goals can be found in the Care Plan section)  Acute Rehab PT Goals Patient Stated Goal: go home PT Goal Formulation: With patient Time For Goal Achievement: 11/06/16 Potential to Achieve Goals: Good    Frequency BID   Barriers to discharge        Co-evaluation               End of Session Equipment Utilized During Treatment: Gait belt Activity Tolerance: Patient tolerated treatment well Patient left: with chair alarm set;with call bell/phone within reach;with family/visitor present Nurse Communication: Mobility status PT Visit Diagnosis: Muscle weakness (generalized) (M62.81);Difficulty in walking, not elsewhere classified (R26.2)    Time: 7342-8768 PT Time Calculation (min) (ACUTE ONLY): 33 min   Charges:   PT Evaluation $PT Eval Low Complexity: 1 Procedure PT Treatments $Gait Training: 8-22 mins $Therapeutic Exercise: 8-22 mins   PT G Codes:        Kreg Shropshire, DPT 10/23/2016, 1:02 PM

## 2016-10-24 LAB — CBC
HEMATOCRIT: 31.3 % — AB (ref 40.0–52.0)
Hemoglobin: 10.8 g/dL — ABNORMAL LOW (ref 13.0–18.0)
MCH: 32.5 pg (ref 26.0–34.0)
MCHC: 34.4 g/dL (ref 32.0–36.0)
MCV: 94.6 fL (ref 80.0–100.0)
Platelets: 107 10*3/uL — ABNORMAL LOW (ref 150–440)
RBC: 3.31 MIL/uL — AB (ref 4.40–5.90)
RDW: 15 % — ABNORMAL HIGH (ref 11.5–14.5)
WBC: 10.1 10*3/uL (ref 3.8–10.6)

## 2016-10-24 LAB — BASIC METABOLIC PANEL
ANION GAP: 5 (ref 5–15)
BUN: 21 mg/dL — ABNORMAL HIGH (ref 6–20)
CHLORIDE: 108 mmol/L (ref 101–111)
CO2: 27 mmol/L (ref 22–32)
Calcium: 7.9 mg/dL — ABNORMAL LOW (ref 8.9–10.3)
Creatinine, Ser: 1.02 mg/dL (ref 0.61–1.24)
GFR calc Af Amer: >60 mL/min (ref >60)
GFR calc non Af Amer: >60 mL/min (ref >60)
Glucose, Bld: 114 mg/dL — ABNORMAL HIGH (ref 65–99)
POTASSIUM: 3.6 mmol/L (ref 3.5–5.1)
Sodium: 140 mmol/L (ref 135–145)

## 2016-10-24 LAB — SURGICAL PATHOLOGY

## 2016-10-24 MED ORDER — LACTULOSE 10 GM/15ML PO SOLN: 10.0000 g | Freq: Two times a day (BID) | ORAL | Status: DC | PRN

## 2016-10-24 MED ORDER — OXYCODONE HCL 5 MG PO TABS: 5.0000 mg | ORAL_TABLET | ORAL | 0 refills | Status: DC | PRN

## 2016-10-24 NOTE — Plan of Care (Signed)
Problem: Skin Integrity: Goal: Signs of wound healing will improve Outcome: Completed/Met Date Met: 10/24/16 Pt has met all goals for discharge.

## 2016-10-24 NOTE — Care Management Note (Signed)
Case Management Note  Patient Details  Name: [name] MRN: [id number] Date of Birth: 06-Feb-1946  Subjective/Objective:   Discharging today                 Action/Plan: Brenton Grills with [facility] notified of discharge. Home on eliquis. Case Clsoed  Expected Discharge Date:  10/24/16               Expected Discharge Plan:  [place]  In-House Referral:     Discharge planning Services  CM Consult  Post Acute Care Choice:  Home Health Choice offered to:  Spouse  DME Arranged:    DME Agency:     HH Arranged:  PT [place]:  [facility]  Status of Service:  Completed, signed off  If discussed at [place] of Stay Meetings, dates discussed:    Additional Comments:  Jolly Mango, RN 10/24/2016, 9:39 AM

## 2016-10-24 NOTE — Progress Notes (Signed)
Subjective: 2 Days Post-Op Procedure(s) (LRB): TOTAL HIP ARTHROPLASTY (Right) Patient reports pain as 4 on 0-10 scale.   Patient is well, and has had no acute complaints or problems Continue with physical therapy today.  Plan is to go Home after hospital stay. no nausea and no vomiting Patient denies any chest pains or shortness of breath. Patient have a little difficulty breathing this morning's secondary to his COPD and exertion with getting up and down out of bed this morning. Patient states this is normal for him.  Objective: Vital signs in last 24 hours: Temp:  [97.3 F (36.3 C)-99 F (37.2 C)] 99 F (37.2 C) (04/05 2319) Pulse Rate:  [59-62] 59 (04/05 2319) Resp:  [16] 16 (04/05 2319) BP: (73-104)/(42-55) 104/55 (04/05 2319) SpO2:  [92 %-97 %] 94 % (04/05 2319) well approximated incision Heels are non tender and elevated off the bed using rolled towels Intake/Output from previous day: 04/05 0701 - 04/06 0700 In: 480 [P.O.:240; IV Piggyback:240] Out: 1286 [Urine:1001; Drains:285] Intake/Output this shift: No intake/output data recorded.   Recent Labs  10/23/16 0506 10/24/16 0502  HGB 10.9* 10.8*    Recent Labs  10/23/16 0506 10/24/16 0502  WBC 9.1 10.1  RBC 3.39* 3.31*  HCT 31.4* 31.3*  PLT 112* 107*    Recent Labs  10/23/16 0506 10/24/16 0502  NA 138 140  K 3.6 3.6  CL 107 108  CO2 26 27  BUN 18 21*  CREATININE 0.89 1.02  GLUCOSE 129* 114*  CALCIUM 7.7* 7.9*   No results for input(s): LABPT, INR in the last 72 hours.  EXAM General - Patient is Alert, Appropriate and Oriented Extremity - Neurologically intact Neurovascular intact Sensation intact distally Intact pulses distally Dorsiflexion/Plantar flexion intact No cellulitis present Compartment soft Dressing - scant drainage Motor Function - intact, moving foot and toes well on exam.    Past Medical History:  Diagnosis Date  . Anxiety   . Arthritis   . Atrial fibrillation ([place])     a. Dx 2013, recurred 02/2014, CHA2DS2VASc = 3 -->placed on Eliquis;  b. 02/2014 Echo: EF 50-55%, mid and apical anterior septum and mid and apical inf septum are abnl, mild to mod Ao sclerosis w/o AS.  Marland Kitchen Chicken pox   . Chronic lymphocytic leukemia ([place])    a. Dx 02/2014.  Marland Kitchen Complication of anesthesia    History of  PTSD--do not touch patient when waking up from surgery.  Marland Kitchen COPD (chronic obstructive pulmonary disease) ([place])   . Coronary artery disease    a. 04/2009 CABG x 3 (LIMA->LAD, VG->OM1, VG->PDA);  b. 09/2009 Cath: occluded VG x 2 w/ patent LIMA and L->R collats. EF 55%, mild antlat HK;  c. 10/2011 MV: EF 53%, no isch/infarct-->low risk.  Marland Kitchen History of chemotherapy 2015-2016  . HOH (hard of hearing)    Bilateral Hearing Aids  . Hypertension   . Myocardial infarction 2010  . OSA on CPAP    USE C-PAP  . Presence of permanent cardiac pacemaker   . PTSD (post-traumatic stress disorder)   . Pure hypercholesterolemia   . Rheumatic fever 1959    Assessment/Plan: 2 Days Post-Op Procedure(s) (LRB): TOTAL HIP ARTHROPLASTY (Right) Active Problems:   S/P total hip arthroplasty  Estimated body mass index is 34.97 kg/m as calculated from the following:   Height as of this encounter: 5\' 8"  (1.727 m).   Weight as of this encounter: 104.3 kg (230 lb). Up with therapy Discharge home with home health  Labs: Were reviewed DVT Prophylaxis - Foot Pumps, TED hose and Eliquis Weight-Bearing as tolerated to right leg Patient needs to have a bowel movement today. Hemovac was discontinued on today's visit. Please change dressing prior to patient being discharged and give the patient 2 extra honeycomb dressings to take home.  Jillyn Ledger. [place] 10/24/2016, 7:43 AM

## 2016-10-24 NOTE — Progress Notes (Signed)
Shift assessment completed at 0800, pt is rating pain at 7/10 and received medication. Pt on room air, lungs clear bilat, Hr irregular, abdomen is soft, bs heard. piv #20 intact to l arm with site free of redness and swelling. R hip with honeycomb dressing in place with scant drainage noted, also gauze dressing to former hemovac site. Ppp, trace edema to lle, teds placed on pt bilat. Pt worked with physical therapy after assessment and breakfast. This Probation officer removed piv with catheter intact and changed the dressings to pt's r hip, giving family member at bedside extra dressing to go home with. D/C instructions reviewed with pt and family, pt escorted to front entrance of facility via wc at 31 to waiting family car.

## 2016-10-24 NOTE — Progress Notes (Signed)
POD 2. Pt. Alert and oriented. vss. Pain controlled with PO pain meds. IS at the bedside and pt. Encouraged to use it. Neurochecks WDL. Pt. Rested quietly throughout the night. Will continue to monitor.

## 2016-10-24 NOTE — Progress Notes (Signed)
qPhysical Therapy Treatment Patient Details Name: [name] MRN: [id number] DOB: 1946-04-21 Today's Date: 10/24/2016    History of Present Illness 71 y/o male here s/p R total hip replacemnt (posterior approach 10/22/16). POD#1 for OT evaluation. PMHx includes anxiety, A-fib, arthritis, chronic lymphocystic leukemia (2015), COPD, CAD, HTN, MI (2010), OSA w/ CPAP, permanent cardiac pacemaker, PTSD, hypercholesteremia, and rheumatic fever.     PT Comments    Pt more sore and stiff this AM, but still very willing to work hard with PT. He circumambulated the nurses' station, negotiated steps, did well with mobility and despite some pain did very well with exercises.  Pt eager to go home and should be able to do so safely without issue.     Follow Up Recommendations  Home health PT     Equipment Recommendations       Recommendations for Other Services       Precautions / Restrictions Precautions Precautions: Posterior Hip;Fall Precaution Booklet Issued: Yes (comment) Restrictions RLE Weight Bearing: Weight bearing as tolerated    Mobility  Bed Mobility Overal bed mobility: Modified Independent             General bed mobility comments: Pt needed PT's hand to pull himself up, did not need direct assist  Transfers Overall transfer level: Modified independent Equipment used: Rolling walker (2 wheeled)             General transfer comment: Pt able to get to standing with very little cuing and with good confidence  Ambulation/Gait Ambulation/Gait assistance: Supervision Ambulation Distance (Feet): 250 Feet Assistive device: Rolling walker (2 wheeled)       General Gait Details: Despite some soreness pt was able to maintain consistent cadence and forward motion t/o ambulation and had no LOBs, only minimal fatigue and was able to use R LE w/o excessive UE use   Stairs Stairs: Yes   Stair Management: Two rails Number of Stairs: 4 General stair comments: Pt again  safe and confident with the steps  Wheelchair Mobility    Modified Rankin (Stroke Patients Only)       Balance Overall balance assessment: Modified Independent                                          Cognition Arousal/Alertness: Awake/alert Behavior During Therapy: WFL for tasks assessed/performed Overall Cognitive Status: Within Functional Limits for tasks assessed                                        Exercises General Exercises - Lower Extremity Ankle Circles/Pumps: 10 reps;Strengthening Quad Sets: Strengthening;10 reps Gluteal Sets: Strengthening;10 reps Short Arc Quad: Strengthening;15 reps Heel Slides: Strengthening;10 reps Hip ABduction/ADduction: Strengthening;10 reps Straight Leg Raises: AAROM;10 reps    General Comments        Pertinent Vitals/Pain Pain Assessment: 0-10 Pain Score: 7     Home Living                      Prior Function            PT Goals (current goals can now be found in the care plan section) Progress towards PT goals: Progressing toward goals    Frequency    BID      PT Plan Current  plan remains appropriate    Co-evaluation             End of Session Equipment Utilized During Treatment: Gait belt Activity Tolerance: Patient tolerated treatment well Patient left: with chair alarm set;with call bell/phone within reach;with family/visitor present Nurse Communication: Mobility status PT Visit Diagnosis: Muscle weakness (generalized) (M62.81);Difficulty in walking, not elsewhere classified (R26.2)     Time: 5038-8828 PT Time Calculation (min) (ACUTE ONLY): 28 min  Charges:  $Gait Training: 8-22 mins $Therapeutic Exercise: 8-22 mins                    G Codes:       Kreg Shropshire, DPT 10/24/2016, 1:02 PM

## 2016-10-28 DIAGNOSIS — I110 Hypertensive heart disease with heart failure: Secondary | ICD-10-CM | POA: Diagnosis not present

## 2016-10-28 DIAGNOSIS — I480 Paroxysmal atrial fibrillation: Secondary | ICD-10-CM | POA: Diagnosis not present

## 2016-10-28 DIAGNOSIS — J449 Chronic obstructive pulmonary disease, unspecified: Secondary | ICD-10-CM | POA: Diagnosis not present

## 2016-10-28 DIAGNOSIS — Z471 Aftercare following joint replacement surgery: Secondary | ICD-10-CM | POA: Diagnosis not present

## 2016-10-28 DIAGNOSIS — Z96641 Presence of right artificial hip joint: Secondary | ICD-10-CM | POA: Diagnosis not present

## 2016-10-28 DIAGNOSIS — I5032 Chronic diastolic (congestive) heart failure: Secondary | ICD-10-CM | POA: Diagnosis not present

## 2016-10-28 DIAGNOSIS — I2510 Atherosclerotic heart disease of native coronary artery without angina pectoris: Secondary | ICD-10-CM | POA: Diagnosis not present

## 2016-10-29 DIAGNOSIS — I5032 Chronic diastolic (congestive) heart failure: Secondary | ICD-10-CM | POA: Diagnosis not present

## 2016-10-29 DIAGNOSIS — Z471 Aftercare following joint replacement surgery: Secondary | ICD-10-CM | POA: Diagnosis not present

## 2016-10-29 DIAGNOSIS — I2510 Atherosclerotic heart disease of native coronary artery without angina pectoris: Secondary | ICD-10-CM | POA: Diagnosis not present

## 2016-10-29 DIAGNOSIS — I110 Hypertensive heart disease with heart failure: Secondary | ICD-10-CM | POA: Diagnosis not present

## 2016-10-29 DIAGNOSIS — Z96641 Presence of right artificial hip joint: Secondary | ICD-10-CM | POA: Diagnosis not present

## 2016-10-29 DIAGNOSIS — J449 Chronic obstructive pulmonary disease, unspecified: Secondary | ICD-10-CM | POA: Diagnosis not present

## 2016-10-29 DIAGNOSIS — I480 Paroxysmal atrial fibrillation: Secondary | ICD-10-CM | POA: Diagnosis not present

## 2016-10-31 DIAGNOSIS — Z471 Aftercare following joint replacement surgery: Secondary | ICD-10-CM | POA: Diagnosis not present

## 2016-10-31 DIAGNOSIS — I480 Paroxysmal atrial fibrillation: Secondary | ICD-10-CM | POA: Diagnosis not present

## 2016-10-31 DIAGNOSIS — I5032 Chronic diastolic (congestive) heart failure: Secondary | ICD-10-CM | POA: Diagnosis not present

## 2016-10-31 DIAGNOSIS — J449 Chronic obstructive pulmonary disease, unspecified: Secondary | ICD-10-CM | POA: Diagnosis not present

## 2016-10-31 DIAGNOSIS — Z96641 Presence of right artificial hip joint: Secondary | ICD-10-CM | POA: Diagnosis not present

## 2016-10-31 DIAGNOSIS — I110 Hypertensive heart disease with heart failure: Secondary | ICD-10-CM | POA: Diagnosis not present

## 2016-10-31 DIAGNOSIS — I2510 Atherosclerotic heart disease of native coronary artery without angina pectoris: Secondary | ICD-10-CM | POA: Diagnosis not present

## 2016-11-03 DIAGNOSIS — I5032 Chronic diastolic (congestive) heart failure: Secondary | ICD-10-CM | POA: Diagnosis not present

## 2016-11-03 DIAGNOSIS — I2510 Atherosclerotic heart disease of native coronary artery without angina pectoris: Secondary | ICD-10-CM | POA: Diagnosis not present

## 2016-11-03 DIAGNOSIS — Z96641 Presence of right artificial hip joint: Secondary | ICD-10-CM | POA: Diagnosis not present

## 2016-11-03 DIAGNOSIS — I110 Hypertensive heart disease with heart failure: Secondary | ICD-10-CM | POA: Diagnosis not present

## 2016-11-03 DIAGNOSIS — J449 Chronic obstructive pulmonary disease, unspecified: Secondary | ICD-10-CM | POA: Diagnosis not present

## 2016-11-03 DIAGNOSIS — I480 Paroxysmal atrial fibrillation: Secondary | ICD-10-CM | POA: Diagnosis not present

## 2016-11-03 DIAGNOSIS — Z471 Aftercare following joint replacement surgery: Secondary | ICD-10-CM | POA: Diagnosis not present

## 2016-11-05 ENCOUNTER — Other Ambulatory Visit: Payer: Medicare HMO

## 2016-11-05 DIAGNOSIS — J449 Chronic obstructive pulmonary disease, unspecified: Secondary | ICD-10-CM | POA: Diagnosis not present

## 2016-11-05 DIAGNOSIS — Z96641 Presence of right artificial hip joint: Secondary | ICD-10-CM | POA: Diagnosis not present

## 2016-11-05 DIAGNOSIS — I2510 Atherosclerotic heart disease of native coronary artery without angina pectoris: Secondary | ICD-10-CM | POA: Diagnosis not present

## 2016-11-05 DIAGNOSIS — I110 Hypertensive heart disease with heart failure: Secondary | ICD-10-CM | POA: Diagnosis not present

## 2016-11-05 DIAGNOSIS — I5032 Chronic diastolic (congestive) heart failure: Secondary | ICD-10-CM | POA: Diagnosis not present

## 2016-11-05 DIAGNOSIS — Z471 Aftercare following joint replacement surgery: Secondary | ICD-10-CM | POA: Diagnosis not present

## 2016-11-05 DIAGNOSIS — I480 Paroxysmal atrial fibrillation: Secondary | ICD-10-CM | POA: Diagnosis not present

## 2016-11-07 DIAGNOSIS — Z471 Aftercare following joint replacement surgery: Secondary | ICD-10-CM | POA: Diagnosis not present

## 2016-11-07 DIAGNOSIS — I110 Hypertensive heart disease with heart failure: Secondary | ICD-10-CM | POA: Diagnosis not present

## 2016-11-07 DIAGNOSIS — I2510 Atherosclerotic heart disease of native coronary artery without angina pectoris: Secondary | ICD-10-CM | POA: Diagnosis not present

## 2016-11-07 DIAGNOSIS — J449 Chronic obstructive pulmonary disease, unspecified: Secondary | ICD-10-CM | POA: Diagnosis not present

## 2016-11-07 DIAGNOSIS — I480 Paroxysmal atrial fibrillation: Secondary | ICD-10-CM | POA: Diagnosis not present

## 2016-11-07 DIAGNOSIS — Z96641 Presence of right artificial hip joint: Secondary | ICD-10-CM | POA: Diagnosis not present

## 2016-11-07 DIAGNOSIS — I5032 Chronic diastolic (congestive) heart failure: Secondary | ICD-10-CM | POA: Diagnosis not present

## 2016-11-10 DIAGNOSIS — J449 Chronic obstructive pulmonary disease, unspecified: Secondary | ICD-10-CM | POA: Diagnosis not present

## 2016-11-10 DIAGNOSIS — I110 Hypertensive heart disease with heart failure: Secondary | ICD-10-CM | POA: Diagnosis not present

## 2016-11-10 DIAGNOSIS — I5032 Chronic diastolic (congestive) heart failure: Secondary | ICD-10-CM | POA: Diagnosis not present

## 2016-11-10 DIAGNOSIS — Z96641 Presence of right artificial hip joint: Secondary | ICD-10-CM | POA: Diagnosis not present

## 2016-11-10 DIAGNOSIS — Z471 Aftercare following joint replacement surgery: Secondary | ICD-10-CM | POA: Diagnosis not present

## 2016-11-10 DIAGNOSIS — I2510 Atherosclerotic heart disease of native coronary artery without angina pectoris: Secondary | ICD-10-CM | POA: Diagnosis not present

## 2016-11-10 DIAGNOSIS — I480 Paroxysmal atrial fibrillation: Secondary | ICD-10-CM | POA: Diagnosis not present

## 2016-11-14 DIAGNOSIS — I5032 Chronic diastolic (congestive) heart failure: Secondary | ICD-10-CM | POA: Diagnosis not present

## 2016-11-14 DIAGNOSIS — J449 Chronic obstructive pulmonary disease, unspecified: Secondary | ICD-10-CM | POA: Diagnosis not present

## 2016-11-14 DIAGNOSIS — I110 Hypertensive heart disease with heart failure: Secondary | ICD-10-CM | POA: Diagnosis not present

## 2016-11-14 DIAGNOSIS — I480 Paroxysmal atrial fibrillation: Secondary | ICD-10-CM | POA: Diagnosis not present

## 2016-11-14 DIAGNOSIS — I2510 Atherosclerotic heart disease of native coronary artery without angina pectoris: Secondary | ICD-10-CM | POA: Diagnosis not present

## 2016-11-14 DIAGNOSIS — Z471 Aftercare following joint replacement surgery: Secondary | ICD-10-CM | POA: Diagnosis not present

## 2016-11-14 DIAGNOSIS — Z96641 Presence of right artificial hip joint: Secondary | ICD-10-CM | POA: Diagnosis not present

## 2016-11-15 NOTE — Progress Notes (Signed)
. Cardiology Office Note  Date:  11/17/2016   ID:  [name], DOB [birth year], MRN [id number]  PCP:  Coral Spikes, DO   Chief Complaint  Patient presents with  . other    71 year old gentleman with a history of  smoking. Pt c/o sob; denies cp. Recent hip replacement. Has questions about isosprbide. Reviewed meds with pt verbally.    HPI:  [name] is a 71 year old gentleman with a history of  smoking, coronary artery disease,  cabg,  catheterization March 2011 with occluded vein graft 2, patent LIMA, collaterals from left to right,  atrial fibrillation March 2013, 10/11/2014. Converted to normal with metoprolol,  Possible conversion pause. 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  Left total hip on the right 10/22/16 Recovering well, Hospital records reviewed  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, no mode switches as of February 2018 BP well controlled at home 120/60 at home   Other past medical history reviewed fell off a ladder beginning of October 2017 Hurt his tailbone, seen by orthopedics locally, told to start physical therapy Symptoms got worse, and he had to stop physical therapy He went to a [facility] for second opinion Seen in the emergency room, [facility] at [facility]: 04/25/2016 showing large hematoma Right gluteal intramuscular hematoma  measuring 9.6 x 6.8 x 4.9 cm [facility] stopped eliquis, statin (unclear why they stopped his statin) Has been off the coagulation since that time,  Hematoma has slowly improved, pain dramatically improved Not at his baseline yet  He denies any tachycardia concerning for atrial fibrillation  EKG personally reviewed by myself on todays visit Paced rhythm rate 67 bpm  Other past medical history reviewed Working with the [facility] for PTSD, reports feeling well, stable Reports that he is been compliant with his Lasix, taking 80  mg daily which is higher dose than before for the past 5 days, no improvement in his leg swelling  atrial fibrillation/flutter starting September 12 2015, then persistent flutter In the clinic his heart rate was 126 bpm Amiodarone dosing was increased, amlodipine was changed to diltiazem 120 mg daily, metoprolol increased up to 25 mill grams twice a day Converted to normal sinus rhythm at home  tachycardia concerning for atrial fibrillation 05/10/2015. Lasted only several minutes Rate possibly up to 140 bpm.  Previously has been very active, building a barn, tool shed, taking care of animals some benefits from the [place] for agent orange exposure and his cancer  total cholesterol 137, LDL 86, earlier in the year  chronic leg pain since he was young.  Previous leg pain with Crestor  admitted to the hospital on September 26 2011 for malaise, appearing pale, irregular heart rhythm and noted to be in atrial fibrillation. He was given medication for rhythm control and he converted to normal sinus rhythm later that evening. No longer on anticoagulation. He has been maintaining normal sinus rhythm No symptoms concerning for atrial fibrillation since that time  Echocardiogram showed ejection fraction 35-45%, mildly elevated right ventricular systolic pressures estimated at 30-40 mm mercury.  Previous catheterization showing occluded LAD in the mid vessel, 60% left main, 99% distal RCA who was sent to [facility] in October 2010 for bypass surgery. He received 3 vessel bypass by Dr.Gerhardt with a LIMA to the LAD, vein graft to the OM1 and vein graft to the PDA, with subsequent cardiac catheter March 2011  for chest discomfort showing occluded vein grafts x2 with patent LIMA. He has collateral vessels from left to right. Ejection fraction 55% mild anterolateral hypokinesis.  PMH:   has a past medical history of Anxiety; Arthritis; Atrial fibrillation ([place]); Chicken pox; Chronic lymphocytic leukemia  ([place]); Complication of anesthesia; COPD (chronic obstructive pulmonary disease) ([place]); Coronary artery disease; History of chemotherapy (2015-2016); HOH (hard of hearing); Hypertension; Myocardial infarction ([place]) (2010); OSA on CPAP; Presence of permanent cardiac pacemaker; PTSD (post-traumatic stress disorder); Pure hypercholesterolemia; and Rheumatic fever (1959).  PSH:    Past Surgical History:  Procedure Laterality Date  . ABDOMINAL HERNIA REPAIR    . APPENDECTOMY  06/21/1985  . CARDIAC CATHETERIZATION  2010; 2011   ; Dr Fletcher Anon  . CORONARY ARTERY BYPASS GRAFT  04/2009   "CABG X3"  . EP IMPLANTABLE DEVICE N/A 03/03/2016   Procedure: Pacemaker Implant;  Surgeon: Deboraha Sprang, MD;  Location: [place] [facility];  Service: Cardiovascular;  Laterality: N/A;  . FOREIGN BODY REMOVAL  1968   "shrapnel in my tailbone"  . INGUINAL HERNIA REPAIR Right   . INSERT / REPLACE / REMOVE PACEMAKER    . LAPAROSCOPIC CHOLECYSTECTOMY    . TONSILLECTOMY AND ADENOIDECTOMY  1956  . TOTAL HIP ARTHROPLASTY Right 10/22/2016   Procedure: TOTAL HIP ARTHROPLASTY;  Surgeon: Dereck Leep, MD;  Location: [facility] ORS;  Service: Orthopedics;  Laterality: Right;    Current Outpatient Prescriptions  Medication Sig Dispense Refill  . acetaminophen (TYLENOL) 500 MG tablet Take 1,000 mg by mouth every 8 (eight) hours as needed for mild pain.    Marland Kitchen albuterol (PROVENTIL HFA;VENTOLIN HFA) 108 (90 Base) MCG/ACT inhaler Inhale 2 puffs into the lungs every 6 (six) hours as needed for wheezing or shortness of breath.     Marland Kitchen amiodarone (PACERONE) 200 MG tablet Take 1 tablet (200 mg total) by mouth daily. (Patient taking differently: Take 100 mg by mouth 2 (two) times daily. ) 90 tablet 3  . amLODipine (NORVASC) 10 MG tablet Take 5 mg by mouth daily. Takes 0.5 tablet    . apixaban (ELIQUIS) 5 MG TABS tablet Take 1 tablet (5 mg total) by mouth 2 (two) times daily. 180 tablet 3  . atorvastatin (LIPITOR) 80 MG tablet Take 1 tablet (80 mg  total) by mouth at bedtime. 90 tablet 3  . budesonide-formoterol (SYMBICORT) 160-4.5 MCG/ACT inhaler Inhale 2 puffs into the lungs 2 (two) times daily.    . cetirizine (ZYRTEC) 10 MG tablet Take 10 mg by mouth daily.    . Coenzyme Q10 (CO Q-10) 100 MG CAPS Take 200 mg by mouth at bedtime.     . furosemide (LASIX) 20 MG tablet Take 20 mg by mouth 2 (two) times daily as needed for fluid (takes with metolazone  for fluid retention).     . isosorbide mononitrate (IMDUR) 30 MG 24 hr tablet Take 1 tablet (30 mg total) by mouth daily. (Patient taking differently: Take 30 mg by mouth daily as needed. ) 30 tablet 6  . Krill Oil 350 MG CAPS Take 350 mg by mouth every evening.    . metolazone (ZAROXOLYN) 5 MG tablet Take 5 mg by mouth 2 (two) times daily as needed. Take with furosemide as needed for fluid retention    . metoprolol tartrate (LOPRESSOR) 25 MG tablet Take 1 tablet (25 mg total) by mouth 2 (two) times daily. (Patient taking differently: Take 25 mg by mouth 2 (two) times daily as needed. ) 180 tablet 3  .  mirtazapine (REMERON) 15 MG tablet Take 15 mg by mouth at bedtime as needed.    . Multiple Vitamin (MULTIVITAMIN WITH MINERALS) TABS tablet Take 1 tablet by mouth daily.    . nitroGLYCERIN (NITROSTAT) 0.4 MG SL tablet Place 1 tablet (0.4 mg total) under the tongue every 5 (five) minutes as needed for chest pain. 25 tablet 6  . oxyCODONE (OXY IR/ROXICODONE) 5 MG immediate release tablet Take 1-2 tablets (5-10 mg total) by mouth every 4 (four) hours as needed for severe pain. 60 tablet 0  . tiotropium (SPIRIVA) 18 MCG inhalation capsule Place 18 mcg into inhaler and inhale at bedtime.    . traMADol (ULTRAM) 50 MG tablet Take 1 tablet (50 mg total) by mouth every 6 (six) hours as needed for moderate pain. (Patient taking differently: Take 50 mg by mouth daily as needed for moderate pain. ) 90 tablet 0   No current facility-administered medications for this visit.      Allergies:   Patient has no  known allergies.   Social History:  The patient  reports that he quit smoking about 10 years ago. His smoking use included Cigarettes. He has a 40.00 pack-year smoking history. He has never used smokeless tobacco. He reports that he does not drink alcohol or use drugs.   Family History:   family history includes Heart disease in his mother.    Review of Systems: Review of Systems  Constitutional: Negative.   Respiratory: Negative.   Cardiovascular: Negative.   Gastrointestinal: Negative.   Musculoskeletal: Negative.        Pain in his gluteal area  Neurological: Positive for dizziness.  Psychiatric/Behavioral: Negative.   All other systems reviewed and are negative.    PHYSICAL EXAM: VS:  BP 140/78 (BP Location: Left Arm, Patient Position: Sitting, Cuff Size: Normal)   Pulse 67   Ht 5\' 9"  (1.753 m)   Wt 233 lb 8 oz (105.9 kg)   BMI 34.48 kg/m  , BMI Body mass index is 34.48 kg/m. GEN: Well nourished, well developed, in no acute distress  HEENT: normal  Neck: no JVD, carotid bruits, or masses Cardiac: RRR; no murmurs, rubs, or gallops,no edema  Respiratory:  clear to auscultation bilaterally, normal work of breathing GI: soft, nontender, nondistended, + BS MS: no deformity or atrophy  Skin: warm and dry, no rash Neuro:  Strength and sensation are intact Psych: euthymic mood, full affect    Recent Labs: 02/26/2016: B Natriuretic Peptide 248.0; TSH 1.256 03/01/2016: Magnesium 2.2 10/15/2016: ALT 30 10/24/2016: BUN 21; Creatinine, Ser 1.02; Hemoglobin 10.8; Platelets 107; Potassium 3.6; Sodium 140    Lipid Panel Lab Results  Component Value Date   CHOL 142 08/23/2015   HDL 33 (L) 08/23/2015   LDLCALC 76 08/23/2015   TRIG 167 (H) 08/23/2015      Wt Readings from Last 3 Encounters:  11/17/16 233 lb 8 oz (105.9 kg)  10/22/16 230 lb (104.3 kg)  10/15/16 230 lb (104.3 kg)       ASSESSMENT AND PLAN:   Atrial fibrillation, unspecified type ([place]) - Plan: EKG  12-Lead Maintaining normal sinus rhythm, We'll continue amiodarone, metoprolol  Anticoagulation, no complications  HYPERCHOLESTEROLEMIA Repeat lipid panel today , ordered and drawn today  Lipitor80 mg daily Goal LDL less than 70  Essential hypertension Blood pressure well controlled Recommended he monitor blood pressure at home  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 with angina pectoris with documented spasm [facility]) Denies any recent symptoms concerning  for angina No further testing at this time  Chronic diastolic CHF (congestive heart failure) ([place]) Currently not taking Lasix on a regular basis.  Recommended he closely monitor his weight, call our office for worsening ankle swelling  Centrilobular emphysema ([place]) Takes several inhalers, reports his breathing is stable. Has follow-up with pulmonary May 14  Sinus pause  pacemaker placement Has follow-up with Dr. Therisa Doyne download reviewed with him   Total encounter time more than 25 minutes  Greater than 50% was spent in counseling and coordination of care with the patient   Disposition:   F/U  6 months   No orders of the defined types were placed in this encounter.    Signed, Esmond Plants, M.D., Ph.D. 11/17/2016  [place], [place]

## 2016-11-17 ENCOUNTER — Ambulatory Visit (INDEPENDENT_AMBULATORY_CARE_PROVIDER_SITE_OTHER): Payer: Medicare HMO | Admitting: Cardiovascular Disease

## 2016-11-17 ENCOUNTER — Encounter: Payer: Self-pay | Admitting: Cardiovascular Disease

## 2016-11-17 VITALS — BP 140/78 | HR 67 | Ht 69.0 in | Wt 233.5 lb

## 2016-11-17 DIAGNOSIS — R69 Illness, unspecified: Secondary | ICD-10-CM | POA: Diagnosis not present

## 2016-11-17 DIAGNOSIS — F4310 Post-traumatic stress disorder, unspecified: Secondary | ICD-10-CM

## 2016-11-17 DIAGNOSIS — I739 Peripheral vascular disease, unspecified: Secondary | ICD-10-CM | POA: Diagnosis not present

## 2016-11-17 DIAGNOSIS — C9110 Chronic lymphocytic leukemia of B-cell type not having achieved remission: Secondary | ICD-10-CM

## 2016-11-17 DIAGNOSIS — C9190 Lymphoid leukemia, unspecified not having achieved remission: Secondary | ICD-10-CM

## 2016-11-17 DIAGNOSIS — E782 Mixed hyperlipidemia: Secondary | ICD-10-CM | POA: Diagnosis not present

## 2016-11-17 DIAGNOSIS — I480 Paroxysmal atrial fibrillation: Secondary | ICD-10-CM | POA: Diagnosis not present

## 2016-11-17 DIAGNOSIS — J432 Centrilobular emphysema: Secondary | ICD-10-CM | POA: Diagnosis not present

## 2016-11-17 DIAGNOSIS — I5032 Chronic diastolic (congestive) heart failure: Secondary | ICD-10-CM

## 2016-11-17 DIAGNOSIS — Z96641 Presence of right artificial hip joint: Secondary | ICD-10-CM | POA: Diagnosis not present

## 2016-11-17 DIAGNOSIS — I495 Sick sinus syndrome: Secondary | ICD-10-CM

## 2016-11-17 DIAGNOSIS — I10 Essential (primary) hypertension: Secondary | ICD-10-CM | POA: Diagnosis not present

## 2016-11-17 DIAGNOSIS — I25708 Atherosclerosis of coronary artery bypass graft(s), unspecified, with other forms of angina pectoris: Secondary | ICD-10-CM

## 2016-11-17 NOTE — Patient Instructions (Addendum)
Medication Instructions:   Stay on same meds   Labwork:  We will check a TSH and lipids today  Testing/Procedures:  No further testing at this time   I recommend watching educational videos on topics of interest to you at:       [email or web address]  Enter code: HEARTCARE    Follow-Up: It was a pleasure seeing you in the office today. Please call us if you have new issues that need to be addressed before your next appt.  [phone]  Your physician wants you to follow-up in: 6 months.  You will receive a reminder letter in the mail two months in advance. If you don't receive a letter, please call our office to schedule the follow-up appointment.  If you need a refill on your cardiac medications before your next appointment, please call your pharmacy.

## 2016-11-18 ENCOUNTER — Other Ambulatory Visit: Payer: Self-pay | Admitting: Cardiovascular Disease

## 2016-11-18 ENCOUNTER — Encounter: Payer: Self-pay | Admitting: Cardiovascular Disease

## 2016-11-18 DIAGNOSIS — Z471 Aftercare following joint replacement surgery: Secondary | ICD-10-CM | POA: Diagnosis not present

## 2016-11-18 LAB — TSH: TSH: 1.15 u[IU]/mL (ref 0.450–4.500)

## 2016-11-18 LAB — LIPID PANEL
CHOLESTEROL TOTAL: 149 mg/dL (ref 100–199)
Chol/HDL Ratio: 3.8 ratio (ref 0.0–5.0)
HDL: 39 mg/dL — AB (ref >39)
LDL Calculated: 84 mg/dL (ref 0–99)
TRIGLYCERIDES: 128 mg/dL (ref 0–149)
VLDL CHOLESTEROL CAL: 26 mg/dL (ref 5–40)

## 2016-11-18 NOTE — Telephone Encounter (Signed)
This encounter was created in error - please disregard.

## 2016-11-18 NOTE — Telephone Encounter (Signed)
Per pt's ov yesterday, he is to take 1/2 tab BID.  Refill sent.

## 2016-11-18 NOTE — Telephone Encounter (Signed)
Please advise how to refill this medication. Thanks!  "Take 1 tablet (200 mg total) by mouth daily. (Patient taking differently: Take 100 mg by mouth 2 (two) times daily."

## 2016-11-20 ENCOUNTER — Ambulatory Visit (INDEPENDENT_AMBULATORY_CARE_PROVIDER_SITE_OTHER): Payer: Medicare HMO | Admitting: *Deleted

## 2016-11-20 DIAGNOSIS — I495 Sick sinus syndrome: Secondary | ICD-10-CM | POA: Diagnosis not present

## 2016-11-20 NOTE — Progress Notes (Signed)
Remote pacemaker transmission.   

## 2016-11-21 LAB — CUP PACEART REMOTE DEVICE CHECK
Battery Voltage: 3.02 V
Brady Statistic AP VP Percent: 0.07 %
Brady Statistic AS VP Percent: 0 %
Brady Statistic RA Percent Paced: 98.78 %
Brady Statistic RV Percent Paced: 0.08 %
Implantable Lead Implant Date: 20170814
Implantable Lead Location: 753860
Implantable Lead Model: 5076
Implantable Lead Model: 5076
Implantable Pulse Generator Implant Date: 20170814
Lead Channel Impedance Value: 323 Ohm
Lead Channel Impedance Value: 456 Ohm
Lead Channel Impedance Value: 475 Ohm
Lead Channel Impedance Value: 551 Ohm
Lead Channel Pacing Threshold Amplitude: 0.875 V
Lead Channel Pacing Threshold Pulse Width: 0.4 ms
Lead Channel Sensing Intrinsic Amplitude: 21.125 mV
Lead Channel Setting Pacing Amplitude: 2 V
Lead Channel Setting Pacing Pulse Width: 0.4 ms
MDC IDC LEAD IMPLANT DT: 20170814
MDC IDC LEAD LOCATION: 753859
MDC IDC MSMT BATTERY REMAINING LONGEVITY: 107 mo
MDC IDC MSMT LEADCHNL RA SENSING INTR AMPL: 2.875 mV
MDC IDC MSMT LEADCHNL RA SENSING INTR AMPL: 2.875 mV
MDC IDC MSMT LEADCHNL RV PACING THRESHOLD AMPLITUDE: 1.125 V
MDC IDC MSMT LEADCHNL RV PACING THRESHOLD PULSEWIDTH: 0.4 ms
MDC IDC MSMT LEADCHNL RV SENSING INTR AMPL: 21.125 mV
MDC IDC SESS DTM: 20180503124827
MDC IDC SET LEADCHNL RV PACING AMPLITUDE: 2.5 V
MDC IDC SET LEADCHNL RV SENSING SENSITIVITY: 0.9 mV
MDC IDC STAT BRADY AP VS PERCENT: 98.71 %
MDC IDC STAT BRADY AS VS PERCENT: 1.21 %

## 2016-11-24 ENCOUNTER — Other Ambulatory Visit: Payer: Self-pay

## 2016-11-24 MED ORDER — EZETIMIBE 10 MG PO TABS: 10.0000 mg | ORAL_TABLET | Freq: Every day | ORAL | 3 refills | Status: DC

## 2016-11-25 DIAGNOSIS — L905 Scar conditions and fibrosis of skin: Secondary | ICD-10-CM | POA: Diagnosis not present

## 2016-11-25 DIAGNOSIS — D0462 Carcinoma in situ of skin of left upper limb, including shoulder: Secondary | ICD-10-CM | POA: Diagnosis not present

## 2016-11-28 ENCOUNTER — Encounter: Payer: Self-pay | Admitting: Cardiology

## 2016-12-01 ENCOUNTER — Encounter: Payer: Self-pay | Admitting: Pulmonary Disease

## 2016-12-01 ENCOUNTER — Ambulatory Visit (INDEPENDENT_AMBULATORY_CARE_PROVIDER_SITE_OTHER): Payer: Medicare HMO | Admitting: Pulmonary Disease

## 2016-12-01 VITALS — BP 130/80 | HR 79 | Ht 69.0 in | Wt 228.0 lb

## 2016-12-01 DIAGNOSIS — J449 Chronic obstructive pulmonary disease, unspecified: Secondary | ICD-10-CM | POA: Diagnosis not present

## 2016-12-01 DIAGNOSIS — G4733 Obstructive sleep apnea (adult) (pediatric): Secondary | ICD-10-CM

## 2016-12-01 MED ORDER — TIOTROPIUM BROMIDE MONOHYDRATE 18 MCG IN CAPS: 18.0000 ug | ORAL_CAPSULE | Freq: Every day | RESPIRATORY_TRACT | 11 refills | Status: DC

## 2016-12-01 MED ORDER — ALBUTEROL SULFATE HFA 108 (90 BASE) MCG/ACT IN AERS: 2.0000 | INHALATION_SPRAY | Freq: Four times a day (QID) | RESPIRATORY_TRACT | 11 refills | Status: DC | PRN

## 2016-12-01 MED ORDER — BUDESONIDE-FORMOTEROL FUMARATE 160-4.5 MCG/ACT IN AERO: 2.0000 | INHALATION_SPRAY | Freq: Two times a day (BID) | RESPIRATORY_TRACT | 11 refills | Status: DC

## 2016-12-01 NOTE — Progress Notes (Signed)
PULMONARY OFFICE PROGRESS NOTE  Date of initial consultation: 06/28/15 Reason for consultation: Dyspnea, history of COPD (previously managed by Dr Lake Bells)   PROBLEMS: COPD - Mild obstruction by PFTs 07/2015 OSA - CPAP initiated 07/2015 PLMD  Initial HPI (06/27/05):  89 M with history of COPD, previously managed by Dr Lake Bells. Referred by Dr Rockey Situ for re-evaluation of chronic exertional dyspnea. Pt reports progressive dyspnea over the past year. He has occasional chest tightness. He has chronic cough with occasional morning mucus. He has been prescribed Symbicort which he is using as a rescue inhaler and albuterol which he doesn't use at all. He is also noted to be on amiodarone. In addition, he reports that his wife has noted frequent nocturnal apneas. His sleep quality is poor whic he attributes to arthritis pain. He has moderate daytime sleepiness but has never fallen asleep at critical times such as while driving. IMP/PLAN (06/27/05): COPD, unspecified; Probable OSA. Counseled re: proper use of his inhaler medications. Symbicort is to be 2 inhalations BID every day and albuterol MDI is to be used as a rescue inhaler. A polysomnogram has been ordered to investigate likely OSA. 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 Probable mild obstruction (FEV1 70% pred) with gas trapping (RV 134% pred) and mild-mod decrease in DLCO PSG (07/25/15): Overall AHI < 10/hr but > 100/hr in supine position. ROV (07/30/15): Dyspnea on exertion is moderately improved with scheduled use of Symbicort. He is also on Spiriva, PRN albuterol with rare use of albuterol MDI. PSG results not available. Continue same COPD regimen CPAP titration (08/29/15): recommend full mask with CPAP 8 cm H2O ROV 12/19/15: bradycardia noted. Discussed with Dr Rockey Situ and metoprolol dose reduced. No changes made to pulmonary medication regimen  Events since  last visit: Right total hip replacement. Uncomplicated  SUBJ (76/19/50): No significant new complaints. He remains on Symbicort and Spiriva. He is using his albuterol rescue inhaler 2-3 times per day with relief of symptoms. He is using CPAP each night with significant improvement in daytime hypersomnolence.Denies CP, fever, purulent sputum, hemoptysis, LE edema and calf tenderness.   Current Outpatient Prescriptions:  .  acetaminophen (TYLENOL) 500 MG tablet, Take 1,000 mg by mouth every 8 (eight) hours as needed for mild pain., Disp: , Rfl:  .  albuterol (PROVENTIL HFA;VENTOLIN HFA) 108 (90 Base) MCG/ACT inhaler, Inhale 2 puffs into the lungs every 6 (six) hours as needed for wheezing or shortness of breath., Disp: 1 Inhaler, Rfl: 11 .  amiodarone (PACERONE) 200 MG tablet, Take 100 mg by mouth 2 (two) times daily., Disp: , Rfl:  .  amLODipine (NORVASC) 10 MG tablet, Take 5 mg by mouth daily. Takes 0.5 tablet, Disp: , Rfl:  .  apixaban (ELIQUIS) 5 MG TABS tablet, Take 1 tablet (5 mg total) by mouth 2 (two) times daily., Disp: 180 tablet, Rfl: 3 .  atorvastatin (LIPITOR) 80 MG tablet, Take 1 tablet (80 mg total) by mouth at bedtime., Disp: 90 tablet, Rfl: 3 .  budesonide-formoterol (SYMBICORT) 160-4.5 MCG/ACT inhaler, Inhale 2 puffs into the lungs 2 (two) times daily., Disp: 1 Inhaler, Rfl: 11 .  cetirizine (ZYRTEC) 10 MG tablet, Take 10 mg by mouth daily., Disp: , Rfl:  .  Coenzyme Q10 (CO Q-10) 100 MG CAPS, Take 200 mg by mouth at bedtime. , Disp: , Rfl:  .  ezetimibe (ZETIA) 10 MG tablet, Take 1 tablet (10 mg total) by mouth  daily., Disp: 90 tablet, Rfl: 3 .  furosemide (LASIX) 20 MG tablet, Take 20 mg by mouth 2 (two) times daily as needed for fluid (takes with metolazone  for fluid retention). , Disp: , Rfl:  .  Krill Oil 350 MG CAPS, Take 350 mg by mouth every evening., Disp: , Rfl:  .  metolazone (ZAROXOLYN) 5 MG tablet, Take 5 mg by mouth 2 (two) times daily as needed. Take with  furosemide as needed for fluid retention, Disp: , Rfl:  .  metoprolol tartrate (LOPRESSOR) 25 MG tablet, Take 1 tablet (25 mg total) by mouth 2 (two) times daily. (Patient taking differently: Take 25 mg by mouth 2 (two) times daily as needed. ), Disp: 180 tablet, Rfl: 3 .  mirtazapine (REMERON) 15 MG tablet, Take 15 mg by mouth at bedtime as needed., Disp: , Rfl:  .  Multiple Vitamin (MULTIVITAMIN WITH MINERALS) TABS tablet, Take 1 tablet by mouth daily., Disp: , Rfl:  .  nitroGLYCERIN (NITROSTAT) 0.4 MG SL tablet, Place 1 tablet (0.4 mg total) under the tongue every 5 (five) minutes as needed for chest pain., Disp: 25 tablet, Rfl: 6 .  oxyCODONE (OXY IR/ROXICODONE) 5 MG immediate release tablet, Take 1-2 tablets (5-10 mg total) by mouth every 4 (four) hours as needed for severe pain., Disp: 60 tablet, Rfl: 0 .  tiotropium (SPIRIVA) 18 MCG inhalation capsule, Place 1 capsule (18 mcg total) into inhaler and inhale at bedtime., Disp: 30 capsule, Rfl: 11 .  traMADol (ULTRAM) 50 MG tablet, Take 1 tablet (50 mg total) by mouth every 6 (six) hours as needed for moderate pain. (Patient taking differently: Take 50 mg by mouth daily as needed for moderate pain. ), Disp: 90 tablet, Rfl: 0    Vitals:   12/01/16 0923  BP: 130/80  Pulse: 79  SpO2: 96%  Weight: 228 lb (103.4 kg)  Height: 5\' 9"  (1.753 m)    EXAM:  Gen: WDWN, NAD HEENT: NCAT, oropharynx normal Neck: Supple without JVD Lungs: breath sounds mildly decreased, no wheezes Cardiovascular: Reg, no M noted  Abdomen: Soft, nontender, normal BS Ext: Trace right ankle edema Neuro: no focal deficits noted  DATA:   BMP Latest Ref Rng & Units 10/24/2016 10/23/2016 10/15/2016  Glucose 65 - 99 mg/dL 114(H) 129(H) 111(H)  BUN 6 - 20 mg/dL 21(H) 18 17  Creatinine 0.61 - 1.24 mg/dL 1.02 0.89 0.98  Sodium 135 - 145 mmol/L 140 138 140  Potassium 3.5 - 5.1 mmol/L 3.6 3.6 3.6  Chloride 101 - 111 mmol/L 108 107 105  CO2 22 - 32 mmol/L 27 26 29   Calcium  8.9 - 10.3 mg/dL 7.9(L) 7.7(L) 9.0    CBC Latest Ref Rng & Units 10/24/2016 10/23/2016 10/15/2016  WBC 3.8 - 10.6 K/uL 10.1 9.1 6.6  Hemoglobin 13.0 - 18.0 g/dL 10.8(L) 10.9(L) 14.2  Hematocrit 40.0 - 52.0 % 31.3(L) 31.4(L) 40.3  Platelets 150 - 440 K/uL 107(L) 112(L) 131(L)    IMPRESSION:   Mild chronic obstructive pulmonary disease - adequately controlled on current regimen OSA - well controlled on CPAP  PLAN:  Cont Symbicort, Spiriva and PRN albuterol. Printed prescriptions have been provided so that he may try to procure these medications through the [facility] CPAP as ordered ROV 6 months or sooner as needed   Merton Border, MD PCCM service Mobile [phone] Pager [phone] 12/01/2016 10:17 AM

## 2016-12-01 NOTE — Patient Instructions (Signed)
Continue Symbicort, Spiriva, albuterol rescue inhaler as previously. Prescriptions have been printed out for you to take to the [facility]. If they require alternatives, let us know and I will write out the prescriptions for those.  Continue CPAP-8 cm H2O  Follow-up in 6 months or sooner as needed

## 2016-12-04 DIAGNOSIS — Z96641 Presence of right artificial hip joint: Secondary | ICD-10-CM | POA: Diagnosis not present

## 2017-01-06 ENCOUNTER — Telehealth: Payer: Self-pay | Admitting: Cardiovascular Disease

## 2017-01-06 MED ORDER — EZETIMIBE 10 MG PO TABS: 10.0000 mg | ORAL_TABLET | Freq: Every day | ORAL | 3 refills | Status: DC

## 2017-01-06 MED ORDER — METOPROLOL TARTRATE 25 MG PO TABS: 25.0000 mg | ORAL_TABLET | Freq: Two times a day (BID) | ORAL | 3 refills | Status: DC | PRN

## 2017-01-06 MED ORDER — ATORVASTATIN CALCIUM 80 MG PO TABS: 80.0000 mg | ORAL_TABLET | Freq: Every day | ORAL | 3 refills | Status: DC

## 2017-01-06 MED ORDER — NITROGLYCERIN 0.4 MG SL SUBL
0.4000 mg | SUBLINGUAL_TABLET | SUBLINGUAL | 6 refills | Status: DC | PRN
Start: 2017-01-06 — End: 2023-04-03

## 2017-01-06 MED ORDER — APIXABAN 5 MG PO TABS: 5.0000 mg | ORAL_TABLET | Freq: Two times a day (BID) | ORAL | 3 refills | Status: DC

## 2017-01-06 NOTE — Telephone Encounter (Signed)
[facility]     Zetia , eliquis, lopressor, atorvastatin, nitro

## 2017-01-06 NOTE — Telephone Encounter (Signed)
Rxs printed for Dr. Donivan Scull signature.

## 2017-01-06 NOTE — Telephone Encounter (Signed)
Rxs provided to pt.

## 2017-02-02 DIAGNOSIS — G4733 Obstructive sleep apnea (adult) (pediatric): Secondary | ICD-10-CM | POA: Diagnosis not present

## 2017-02-11 ENCOUNTER — Inpatient Hospital Stay: Payer: Non-veteran care | Attending: Internal Medicine

## 2017-02-11 ENCOUNTER — Inpatient Hospital Stay (HOSPITAL_BASED_OUTPATIENT_CLINIC_OR_DEPARTMENT_OTHER): Payer: Non-veteran care | Admitting: Internal Medicine

## 2017-02-11 VITALS — BP 123/74 | HR 65 | Temp 98.1°F | Resp 16 | Wt 224.6 lb

## 2017-02-11 DIAGNOSIS — I252 Old myocardial infarction: Secondary | ICD-10-CM | POA: Diagnosis not present

## 2017-02-11 DIAGNOSIS — J449 Chronic obstructive pulmonary disease, unspecified: Secondary | ICD-10-CM

## 2017-02-11 DIAGNOSIS — I10 Essential (primary) hypertension: Secondary | ICD-10-CM | POA: Insufficient documentation

## 2017-02-11 DIAGNOSIS — F419 Anxiety disorder, unspecified: Secondary | ICD-10-CM | POA: Diagnosis not present

## 2017-02-11 DIAGNOSIS — G4733 Obstructive sleep apnea (adult) (pediatric): Secondary | ICD-10-CM | POA: Diagnosis not present

## 2017-02-11 DIAGNOSIS — Z950 Presence of cardiac pacemaker: Secondary | ICD-10-CM | POA: Diagnosis not present

## 2017-02-11 DIAGNOSIS — Z79899 Other long term (current) drug therapy: Secondary | ICD-10-CM | POA: Diagnosis not present

## 2017-02-11 DIAGNOSIS — E7800 Pure hypercholesterolemia, unspecified: Secondary | ICD-10-CM

## 2017-02-11 DIAGNOSIS — M129 Arthropathy, unspecified: Secondary | ICD-10-CM | POA: Insufficient documentation

## 2017-02-11 DIAGNOSIS — F4310 Post-traumatic stress disorder, unspecified: Secondary | ICD-10-CM

## 2017-02-11 DIAGNOSIS — C9111 Chronic lymphocytic leukemia of B-cell type in remission: Secondary | ICD-10-CM | POA: Insufficient documentation

## 2017-02-11 DIAGNOSIS — I2510 Atherosclerotic heart disease of native coronary artery without angina pectoris: Secondary | ICD-10-CM | POA: Insufficient documentation

## 2017-02-11 DIAGNOSIS — I4891 Unspecified atrial fibrillation: Secondary | ICD-10-CM | POA: Insufficient documentation

## 2017-02-11 DIAGNOSIS — Z87891 Personal history of nicotine dependence: Secondary | ICD-10-CM | POA: Insufficient documentation

## 2017-02-11 DIAGNOSIS — C9110 Chronic lymphocytic leukemia of B-cell type not having achieved remission: Secondary | ICD-10-CM

## 2017-02-11 LAB — CBC WITH DIFFERENTIAL/PLATELET
BASOS ABS: 0.1 10*3/uL (ref 0–0.1)
Basophils Relative: 1 %
EOS ABS: 0.3 10*3/uL (ref 0–0.7)
Eosinophils Relative: 3 %
HCT: 37.9 % — ABNORMAL LOW (ref 40.0–52.0)
Hemoglobin: 13.2 g/dL (ref 13.0–18.0)
LYMPHS PCT: 42 %
Lymphs Abs: 4 10*3/uL — ABNORMAL HIGH (ref 1.0–3.6)
MCH: 32.5 pg (ref 26.0–34.0)
MCHC: 34.9 g/dL (ref 32.0–36.0)
MCV: 93.2 fL (ref 80.0–100.0)
Monocytes Absolute: 0.7 10*3/uL (ref 0.2–1.0)
Monocytes Relative: 7 %
Neutro Abs: 4.5 10*3/uL (ref 1.4–6.5)
Neutrophils Relative %: 47 %
Platelets: 145 10*3/uL — ABNORMAL LOW (ref 150–440)
RBC: 4.07 MIL/uL — AB (ref 4.40–5.90)
RDW: 14.5 % (ref 11.5–14.5)
WBC: 9.6 10*3/uL (ref 3.8–10.6)

## 2017-02-11 LAB — COMPREHENSIVE METABOLIC PANEL
ALT: 29 U/L (ref 17–63)
AST: 27 U/L (ref 15–41)
Albumin: 4.3 g/dL (ref 3.5–5.0)
Alkaline Phosphatase: 86 U/L (ref 38–126)
Anion gap: 5 (ref 5–15)
BUN: 11 mg/dL (ref 6–20)
CO2: 27 mmol/L (ref 22–32)
CREATININE: 0.99 mg/dL (ref 0.61–1.24)
Calcium: 8.8 mg/dL — ABNORMAL LOW (ref 8.9–10.3)
Chloride: 106 mmol/L (ref 101–111)
GFR calc Af Amer: >60 mL/min (ref >60)
GFR calc non Af Amer: >60 mL/min (ref >60)
GLUCOSE: 95 mg/dL (ref 65–99)
Potassium: 3.6 mmol/L (ref 3.5–5.1)
SODIUM: 138 mmol/L (ref 135–145)
Total Bilirubin: 1.4 mg/dL — ABNORMAL HIGH (ref 0.3–1.2)
Total Protein: 6.8 g/dL (ref 6.5–8.1)

## 2017-02-11 LAB — LACTATE DEHYDROGENASE: LDH: 169 U/L (ref 98–192)

## 2017-02-11 MED ORDER — TRAMADOL HCL 50 MG PO TABS: 50.0000 mg | ORAL_TABLET | Freq: Two times a day (BID) | ORAL | 0 refills | Status: DC | PRN

## 2017-02-11 NOTE — Assessment & Plan Note (Addendum)
#   CLL/SLL- status post bendamustine rituximab 6 cycles [finished in April 2016]- s/ maintenance rituximab every 2 months until March 2017. CT scan March 2017 shows- no evidence of any lymphadenopathy in the neck/chest /abdomen pelvis; no splenomegaly.  # CBC-within normal limits. Clinically no evidence of recurrence. Continue surveillance; no surveillance imaging planned at this time. We will plan imaging based on symptoms  #Status post hip replacement. Improvement of pain   # Recommend follow-up in 6 months/CBC CMP and LDH.

## 2017-02-11 NOTE — Progress Notes (Signed)
Patient is here today for a follow up. Patient states no new concerns today.  

## 2017-02-11 NOTE — Progress Notes (Signed)
[facility] OFFICE PROGRESS NOTE  Patient Care Team: Coral Spikes, DO as PCP - General (Family Medicine) Rockey Situ Kathlene November, MD as Consulting Physician (Cardiology)   SUMMARY OF ONCOLOGIC HISTORY:  Oncology History   # AUG 2015- SLL/CLL [facility] Bx] s/p Benda-Rituxan x6 [finished March 2016]; Maintenance Rituxan q 56m [started April 2016; Dr.Pandit];Last Ritux Jan 2017.  MARCH 2017- CT N/C/A/P- NED. STOP Ritux; surveillance    # s/p PPM [Dr.Klein; Sep 2017]; A.fib [on eliquis]     CLL (chronic lymphocytic leukemia) (HCC)    Initial Diagnosis    CLL (chronic lymphocytic leukemia) (HCC)       INTERVAL HISTORY:  A pleasant 71 year old male patient with above history of SLL/CLL currently on surveillance is here for follow-up.   In the interim patient had hip replacement done. He has noted improvement in his quality of life/hip pain.  Patient's appetite is good. He denies any weight loss. Denies any profuse night sweats.  Denies any frequent infections. Denies any chest pain or cough.   REVIEW OF SYSTEMS:  A complete 10 point review of system is done which is negative except mentioned above/history of present illness.   PAST MEDICAL HISTORY :  Past Medical History:  Diagnosis Date  . Anxiety   . Arthritis   . Atrial fibrillation ([place])    a. Dx 2013, recurred 02/2014, CHA2DS2VASc = 3 -->placed on Eliquis;  b. 02/2014 Echo: EF 50-55%, mid and apical anterior septum and mid and apical inf septum are abnl, mild to mod Ao sclerosis w/o AS.  Marland Kitchen Chicken pox   . Chronic lymphocytic leukemia ([place])    a. Dx 02/2014.  Marland Kitchen Complication of anesthesia    History of  PTSD--do not touch patient when waking up from surgery.  Marland Kitchen COPD (chronic obstructive pulmonary disease) ([place])   . Coronary artery disease    a. 04/2009 CABG x 3 (LIMA->LAD, VG->OM1, VG->PDA);  b. 09/2009 Cath: occluded VG x 2 w/ patent LIMA and L->R collats. EF 55%, mild antlat HK;  c. 10/2011 MV: EF 53%, no  isch/infarct-->low risk.  Marland Kitchen History of chemotherapy 2015-2016  . HOH (hard of hearing)    Bilateral Hearing Aids  . Hypertension   . Myocardial infarction ([place]) 2010  . OSA on CPAP    USE C-PAP  . Presence of permanent cardiac pacemaker   . PTSD (post-traumatic stress disorder)   . Pure hypercholesterolemia   . Rheumatic fever 1959    PAST SURGICAL HISTORY :   Past Surgical History:  Procedure Laterality Date  . ABDOMINAL HERNIA REPAIR    . APPENDECTOMY  06/21/1985  . CARDIAC CATHETERIZATION  2010; 2011   ; Dr Fletcher Anon  . CORONARY ARTERY BYPASS GRAFT  04/2009   "CABG X3"  . EP IMPLANTABLE DEVICE N/A 03/03/2016   Procedure: Pacemaker Implant;  Surgeon: Deboraha Sprang, MD;  Location: [facility];  Service: Cardiovascular;  Laterality: N/A;  . FOREIGN BODY REMOVAL  1968   "shrapnel in my tailbone"  . INGUINAL HERNIA REPAIR Right   . INSERT / REPLACE / REMOVE PACEMAKER    . LAPAROSCOPIC CHOLECYSTECTOMY    . TONSILLECTOMY AND ADENOIDECTOMY  1956  . TOTAL HIP ARTHROPLASTY Right 10/22/2016   Procedure: TOTAL HIP ARTHROPLASTY;  Surgeon: Dereck Leep, MD;  Location: [facility];  Service: Orthopedics;  Laterality: Right;    FAMILY HISTORY :   Family History  Problem Relation Age of Onset  . Heart disease Mother   .  Coronary artery disease Unknown        family history    SOCIAL HISTORY:   Social History  Substance Use Topics  . Smoking status: Former Smoker    Packs/day: 1.00    Years: 40.00    Types: Cigarettes    Quit date: 07/21/2006  . Smokeless tobacco: Never Used  . Alcohol use No    ALLERGIES:  has No Known Allergies.  MEDICATIONS:  Current Outpatient Prescriptions  Medication Sig Dispense Refill  . acetaminophen (TYLENOL) 500 MG tablet Take 1,000 mg by mouth every 8 (eight) hours as needed for mild pain.    Marland Kitchen albuterol (PROVENTIL HFA;VENTOLIN HFA) 108 (90 Base) MCG/ACT inhaler Inhale 2 puffs into the lungs every 6 (six) hours as needed for wheezing or  shortness of breath. 1 Inhaler 11  . amiodarone (PACERONE) 200 MG tablet Take 100 mg by mouth 2 (two) times daily.    Marland Kitchen amLODipine (NORVASC) 10 MG tablet Take 5 mg by mouth daily. Takes 0.5 tablet    . apixaban (ELIQUIS) 5 MG TABS tablet Take 1 tablet (5 mg total) by mouth 2 (two) times daily. 180 tablet 3  . atorvastatin (LIPITOR) 80 MG tablet Take 1 tablet (80 mg total) by mouth at bedtime. 90 tablet 3  . budesonide-formoterol (SYMBICORT) 160-4.5 MCG/ACT inhaler Inhale 2 puffs into the lungs 2 (two) times daily. 1 Inhaler 11  . cetirizine (ZYRTEC) 10 MG tablet Take 10 mg by mouth daily.    . Coenzyme Q10 (CO Q-10) 100 MG CAPS Take 200 mg by mouth at bedtime.     Marland Kitchen ezetimibe (ZETIA) 10 MG tablet Take 1 tablet (10 mg total) by mouth daily. 90 tablet 3  . furosemide (LASIX) 20 MG tablet Take 20 mg by mouth 2 (two) times daily as needed for fluid (takes with metolazone  for fluid retention).     Javier Docker Oil 350 MG CAPS Take 350 mg by mouth every evening.    . metoprolol tartrate (LOPRESSOR) 25 MG tablet Take 1 tablet (25 mg total) by mouth 2 (two) times daily as needed. 180 tablet 3  . mirtazapine (REMERON) 15 MG tablet Take 15 mg by mouth at bedtime as needed.    . Multiple Vitamin (MULTIVITAMIN WITH MINERALS) TABS tablet Take 1 tablet by mouth daily.    . nitroGLYCERIN (NITROSTAT) 0.4 MG SL tablet Place 1 tablet (0.4 mg total) under the tongue every 5 (five) minutes as needed for chest pain. 25 tablet 6  . oxyCODONE (OXY IR/ROXICODONE) 5 MG immediate release tablet Take 1-2 tablets (5-10 mg total) by mouth every 4 (four) hours as needed for severe pain. 60 tablet 0  . tiotropium (SPIRIVA) 18 MCG inhalation capsule Place 1 capsule (18 mcg total) into inhaler and inhale at bedtime. 30 capsule 11  . traMADol (ULTRAM) 50 MG tablet Take 1 tablet (50 mg total) by mouth every 12 (twelve) hours as needed for moderate pain. 90 tablet 0  . metolazone (ZAROXOLYN) 5 MG tablet Take 5 mg by mouth 2 (two) times  daily as needed. Take with furosemide as needed for fluid retention     No current facility-administered medications for this visit.     PHYSICAL EXAMINATION: ECOG PERFORMANCE STATUS: 0 - Asymptomatic  BP 123/74 (BP Location: Left Arm, Patient Position: Sitting)   Pulse 65   Temp 98.1 F (36.7 C) (Tympanic)   Resp 16   Wt 224 lb 9.6 oz (101.9 kg)   BMI 33.17 kg/m   [employer]  Weights   02/11/17 1534  Weight: 224 lb 9.6 oz (101.9 kg)    GENERAL: Well-nourished well-developed; Alert, no distress and comfortable. He is with his wife. Walks with a cane.   EYES: no pallor or icterus OROPHARYNX: no thrush or ulceration; dentures.  NECK: supple, no masses felt LYMPH:  no palpable lymphadenopathy in the cervical, axillary or inguinal regions LUNGS: clear to auscultation and  No wheeze or crackles HEART/CVS: regular rate & rhythm and no murmurs; No lower extremity edema ABDOMEN:abdomen soft, non-tender and normal bowel sounds Musculoskeletal:no cyanosis of digits and no clubbing  PSYCH: alert & oriented x 3 with fluent speech NEURO: no focal motor/sensory deficits SKIN:  no rashes or significant lesions  LABORATORY DATA:  I have reviewed the data as listed    Component Value Date/Time   NA 138 02/11/2017 1505   NA 139 10/11/2014 1800   K 3.6 02/11/2017 1505   K 3.3 (L) 10/11/2014 1800   CL 106 02/11/2017 1505   CL 106 10/11/2014 1800   CO2 27 02/11/2017 1505   CO2 27 10/11/2014 1800   GLUCOSE 95 02/11/2017 1505   GLUCOSE 107 (H) 10/11/2014 1800   BUN 11 02/11/2017 1505   BUN 15 10/11/2014 1800   CREATININE 0.99 02/11/2017 1505   CREATININE 0.89 10/11/2014 1800   CALCIUM 8.8 (L) 02/11/2017 1505   CALCIUM 8.8 (L) 10/11/2014 1800   PROT 6.8 02/11/2017 1505   PROT 6.4 (L) 10/11/2014 1800   ALBUMIN 4.3 02/11/2017 1505   ALBUMIN 4.1 10/11/2014 1800   AST 27 02/11/2017 1505   AST 23 10/11/2014 1800   ALT 29 02/11/2017 1505   ALT 22 10/11/2014 1800   ALKPHOS 86 02/11/2017  1505   ALKPHOS 61 10/11/2014 1800   BILITOT 1.4 (H) 02/11/2017 1505   BILITOT 0.9 10/11/2014 1800   GFRNONAA >60 02/11/2017 1505   GFRNONAA >60 10/11/2014 1800   GFRAA >60 02/11/2017 1505   GFRAA >60 10/11/2014 1800    No results found for: SPEP, UPEP  Lab Results  Component Value Date   WBC 9.6 02/11/2017   NEUTROABS 4.5 02/11/2017   HGB 13.2 02/11/2017   HCT 37.9 (L) 02/11/2017   MCV 93.2 02/11/2017   PLT 145 (L) 02/11/2017      Chemistry      Component Value Date/Time   NA 138 02/11/2017 1505   NA 139 10/11/2014 1800   K 3.6 02/11/2017 1505   K 3.3 (L) 10/11/2014 1800   CL 106 02/11/2017 1505   CL 106 10/11/2014 1800   CO2 27 02/11/2017 1505   CO2 27 10/11/2014 1800   BUN 11 02/11/2017 1505   BUN 15 10/11/2014 1800   CREATININE 0.99 02/11/2017 1505   CREATININE 0.89 10/11/2014 1800      Component Value Date/Time   CALCIUM 8.8 (L) 02/11/2017 1505   CALCIUM 8.8 (L) 10/11/2014 1800   ALKPHOS 86 02/11/2017 1505   ALKPHOS 61 10/11/2014 1800   AST 27 02/11/2017 1505   AST 23 10/11/2014 1800   ALT 29 02/11/2017 1505   ALT 22 10/11/2014 1800   BILITOT 1.4 (H) 02/11/2017 1505   BILITOT 0.9 10/11/2014 1800       ASSESSMENT & PLAN:   CLL (chronic lymphocytic leukemia) (HCC) # CLL/SLL- status post bendamustine rituximab 6 cycles [finished in April 2016]- s/ maintenance rituximab every 2 months until March 2017. CT scan March 2017 shows- no evidence of any lymphadenopathy in the neck/chest /abdomen pelvis; no splenomegaly.  #  CBC-within normal limits. Clinically no evidence of recurrence. Continue surveillance; no surveillance imaging planned at this time. We will plan imaging based on symptoms  #Status post hip replacement. Improvement of pain   # Recommend follow-up in 6 months/CBC CMP and LDH.     Cammie Sickle, MD 02/11/2017 3:52 PM

## 2017-02-17 ENCOUNTER — Ambulatory Visit (INDEPENDENT_AMBULATORY_CARE_PROVIDER_SITE_OTHER): Payer: Medicare HMO | Admitting: Internal Medicine

## 2017-02-17 ENCOUNTER — Encounter: Payer: Self-pay | Admitting: Internal Medicine

## 2017-02-17 VITALS — BP 118/90 | HR 77 | Ht 69.0 in | Wt 224.5 lb

## 2017-02-17 DIAGNOSIS — I495 Sick sinus syndrome: Secondary | ICD-10-CM | POA: Diagnosis not present

## 2017-02-17 DIAGNOSIS — I480 Paroxysmal atrial fibrillation: Secondary | ICD-10-CM

## 2017-02-17 DIAGNOSIS — Z79899 Other long term (current) drug therapy: Secondary | ICD-10-CM | POA: Diagnosis not present

## 2017-02-17 DIAGNOSIS — Z950 Presence of cardiac pacemaker: Secondary | ICD-10-CM | POA: Diagnosis not present

## 2017-02-17 LAB — CUP PACEART INCLINIC DEVICE CHECK
Brady Statistic AP VP Percent: 0.16 %
Brady Statistic AP VS Percent: 98.54 %
Brady Statistic AS VP Percent: 0 %
Brady Statistic AS VS Percent: 1.29 %
Brady Statistic RA Percent Paced: 98.7 %
Brady Statistic RV Percent Paced: 0.17 %
Date Time Interrogation Session: 20180731120132
Implantable Lead Implant Date: 20170814
Implantable Lead Implant Date: 20170814
Implantable Lead Location: 753859
Implantable Lead Model: 5076
Lead Channel Impedance Value: 456 Ohm
Lead Channel Impedance Value: 456 Ohm
Lead Channel Impedance Value: 570 Ohm
Lead Channel Pacing Threshold Amplitude: 1 V
Lead Channel Pacing Threshold Pulse Width: 0.4 ms
Lead Channel Sensing Intrinsic Amplitude: 22 mV
Lead Channel Setting Pacing Amplitude: 2 V
Lead Channel Setting Sensing Sensitivity: 0.9 mV
MDC IDC LEAD LOCATION: 753860
MDC IDC MSMT BATTERY REMAINING LONGEVITY: 100 mo
MDC IDC MSMT BATTERY VOLTAGE: 3.01 V
MDC IDC MSMT LEADCHNL RA IMPEDANCE VALUE: 342 Ohm
MDC IDC MSMT LEADCHNL RA SENSING INTR AMPL: 3.25 mV
MDC IDC MSMT LEADCHNL RV PACING THRESHOLD AMPLITUDE: 1 V
MDC IDC MSMT LEADCHNL RV PACING THRESHOLD PULSEWIDTH: 0.4 ms
MDC IDC PG IMPLANT DT: 20170814
MDC IDC SET LEADCHNL RV PACING AMPLITUDE: 2.5 V
MDC IDC SET LEADCHNL RV PACING PULSEWIDTH: 0.4 ms

## 2017-02-17 NOTE — Patient Instructions (Signed)
Medication Instructions: - Your physician has recommended you make the following change in your medication:  1) Stop norvasc (amlodipine)  Labwork: - none ordered  Procedures/Testing: - none ordered  Follow-Up: - Remote monitoring is used to monitor your Pacemaker of ICD from home. This monitoring reduces the number of office visits required to check your device to one time per year. It allows [place] to keep an eye on the functioning of your device to ensure it is working properly. You are scheduled for a device check from home on 05/19/17. You may send your transmission at any time that day. If you have a wireless device, the transmission will be sent automatically. After your physician reviews your transmission, you will receive a postcard with your next transmission date.  - Your physician wants you to follow-up in: 1 year with Dr. Caryl Comes. You will receive a reminder letter in the mail two months in advance. If you don't receive a letter, please call our office to schedule the follow-up appointment.  Any Additional Special Instructions Will Be Listed Below (If Applicable).     If you need a refill on your cardiac medications before your next appointment, please call your pharmacy.

## 2017-02-17 NOTE — Progress Notes (Signed)
Patient Care Team: Coral Spikes, DO as PCP - General (Family Medicine) Rockey Situ Kathlene November, MD as Consulting Physician (Cardiology)   HPI  [name] is a 71 y.o. male Seen in follow-up for atrial fibrillation managed with ELIQUIS and amiodarone. He was seen in August 2017 with significant sinus bradycardia and underwent pacing received an MRI compatible device He also has coronary artery disease with prior bypass surgery  The patient denies chest pain,  nocturnal dyspnea, orthopnea or peripheral edema.  There have been no palpitations, lightheadedness or syncope.  He notes that when he walks down to the pons and backup that he is quite short of breath. When he sits in his chair his heart rate is about 100 bpm.  Patient denies symptoms of GI intolerance, sun sensitivity, neurological symptoms attributable to amiodarone.  Surveillance laboratories were in normal limits when checked 7/18 See Below      Date Cr Hgb TSH LFTs  7/18 0.99 13.2 1.15(4/18) 29             DATE TEST    2/17    Myoview   EF 50 % No ischemia  4/17    Echo   EF 60 %            Past Medical History:  Diagnosis Date  . Anxiety   . Arthritis   . Atrial fibrillation ([place])    a. Dx 2013, recurred 02/2014, CHA2DS2VASc = 3 -->placed on Eliquis;  b. 02/2014 Echo: EF 50-55%, mid and apical anterior septum and mid and apical inf septum are abnl, mild to mod Ao sclerosis w/o AS.  Marland Kitchen Chicken pox   . Chronic lymphocytic leukemia ([place])    a. Dx 02/2014.  Marland Kitchen Complication of anesthesia    History of  PTSD--do not touch patient when waking up from surgery.  Marland Kitchen COPD (chronic obstructive pulmonary disease) ([place])   . Coronary artery disease    a. 04/2009 CABG x 3 (LIMA->LAD, VG->OM1, VG->PDA);  b. 09/2009 Cath: occluded VG x 2 w/ patent LIMA and L->R collats. EF 55%, mild antlat HK;  c. 10/2011 MV: EF 53%, no isch/infarct-->low risk.  Marland Kitchen History of chemotherapy 2015-2016  . HOH (hard of hearing)    Bilateral Hearing  Aids  . Hypertension   . Myocardial infarction ([place]) 2010  . OSA on CPAP    USE C-PAP  . Presence of permanent cardiac pacemaker   . PTSD (post-traumatic stress disorder)   . Pure hypercholesterolemia   . Rheumatic fever 1959    Past Surgical History:  Procedure Laterality Date  . ABDOMINAL HERNIA REPAIR    . APPENDECTOMY  06/21/1985  . CARDIAC CATHETERIZATION  2010; 2011   ; Dr Fletcher Anon  . CORONARY ARTERY BYPASS GRAFT  04/2009   "CABG X3"  . EP IMPLANTABLE DEVICE N/A 03/03/2016   Procedure: Pacemaker Implant;  Surgeon: Deboraha Sprang, MD;  Location: [facility];  Service: Cardiovascular;  Laterality: N/A;  . FOREIGN BODY REMOVAL  1968   "shrapnel in my tailbone"  . INGUINAL HERNIA REPAIR Right   . INSERT / REPLACE / REMOVE PACEMAKER    . LAPAROSCOPIC CHOLECYSTECTOMY    . TONSILLECTOMY AND ADENOIDECTOMY  1956  . TOTAL HIP ARTHROPLASTY Right 10/22/2016   Procedure: TOTAL HIP ARTHROPLASTY;  Surgeon: Dereck Leep, MD;  Location: [facility];  Service: Orthopedics;  Laterality: Right;    Current Outpatient Prescriptions  Medication Sig Dispense Refill  . acetaminophen (TYLENOL)  500 MG tablet Take 1,000 mg by mouth every 8 (eight) hours as needed for mild pain.    Marland Kitchen albuterol (PROVENTIL HFA;VENTOLIN HFA) 108 (90 Base) MCG/ACT inhaler Inhale 2 puffs into the lungs every 6 (six) hours as needed for wheezing or shortness of breath. 1 Inhaler 11  . amiodarone (PACERONE) 200 MG tablet Take 100 mg by mouth 2 (two) times daily.    Marland Kitchen amLODipine (NORVASC) 10 MG tablet Take 5 mg by mouth daily. Takes 0.5 tablet    . apixaban (ELIQUIS) 5 MG TABS tablet Take 1 tablet (5 mg total) by mouth 2 (two) times daily. 180 tablet 3  . atorvastatin (LIPITOR) 80 MG tablet Take 1 tablet (80 mg total) by mouth at bedtime. 90 tablet 3  . budesonide-formoterol (SYMBICORT) 160-4.5 MCG/ACT inhaler Inhale 2 puffs into the lungs 2 (two) times daily. 1 Inhaler 11  . cetirizine (ZYRTEC) 10 MG tablet Take 10 mg by  mouth daily.    . Coenzyme Q10 (CO Q-10) 100 MG CAPS Take 200 mg by mouth at bedtime.     Marland Kitchen ezetimibe (ZETIA) 10 MG tablet Take 1 tablet (10 mg total) by mouth daily. 90 tablet 3  . furosemide (LASIX) 20 MG tablet Take 20 mg by mouth 2 (two) times daily as needed for fluid (takes with metolazone  for fluid retention).     Javier Docker Oil 350 MG CAPS Take 350 mg by mouth every evening.    . metolazone (ZAROXOLYN) 5 MG tablet Take 5 mg by mouth 2 (two) times daily as needed. Take with furosemide as needed for fluid retention    . metoprolol tartrate (LOPRESSOR) 25 MG tablet Take 1 tablet (25 mg total) by mouth 2 (two) times daily as needed. 180 tablet 3  . mirtazapine (REMERON) 15 MG tablet Take 15 mg by mouth at bedtime as needed.    . Multiple Vitamin (MULTIVITAMIN WITH MINERALS) TABS tablet Take 1 tablet by mouth daily.    . nitroGLYCERIN (NITROSTAT) 0.4 MG SL tablet Place 1 tablet (0.4 mg total) under the tongue every 5 (five) minutes as needed for chest pain. 25 tablet 6  . oxyCODONE (OXY IR/ROXICODONE) 5 MG immediate release tablet Take 1-2 tablets (5-10 mg total) by mouth every 4 (four) hours as needed for severe pain. 60 tablet 0  . tiotropium (SPIRIVA) 18 MCG inhalation capsule Place 1 capsule (18 mcg total) into inhaler and inhale at bedtime. 30 capsule 11  . traMADol (ULTRAM) 50 MG tablet Take 1 tablet (50 mg total) by mouth every 12 (twelve) hours as needed for moderate pain. 90 tablet 0   No current facility-administered medications for this visit.     No Known Allergies    Review of Systems negative except from HPI and PMH  Physical Exam BP 118/90 (BP Location: Left Arm, Patient Position: Sitting, Cuff Size: Normal)   Pulse 77   Ht 5\' 9"  (1.753 m)   Wt 224 lb 8 oz (101.8 kg)   BMI 33.15 kg/m  Well developed and well nourished in no acute distress HENT normal E scleral and icterus clear Neck Supple JVP flat; carotids brisk and full Clear to ausculation Device pocket well  healed; without hematoma or erythema.  There is no tethering *Regular rate and rhythm, no murmurs gallops or rub Soft with active bowel sounds No clubbing cyanosis tr Edema Alert and oriented, grossly normal motor and sensory function Skin Warm and Dry    Assessment and  Plan  Sinus node dysfunction  With chronotropic incompetence  Paroxysmal atrial fibrillation  High Risk Medication Surveillance  Dyspnea on exertion  Pacemaker-[employer]  Hypertension  No intercurrent atrial fibrillation or flutter  On Anticoagulation;  No bleeding issues   BP well controlled   amio tolerated  The dyspnea on exertion in the context of his chronotropic incompetence and pacemaker in generated heart rate raises to concerns. The first is inadequate response and a second one is excessive response. I suggested a pulse oximeter might be that she does told that we have to try to elucidate that. I think the likelihood of the former is low based on history grams. The likelihood of the latter is hard to distinguish. ] More than 50% of 40 min was spent in counseling related to the above         Current medicines are reviewed at length with the patient today .  The patient does not  have concerns regarding medicines.

## 2017-03-05 DIAGNOSIS — Z96641 Presence of right artificial hip joint: Secondary | ICD-10-CM | POA: Diagnosis not present

## 2017-03-09 ENCOUNTER — Telehealth: Payer: Self-pay | Admitting: Family Medicine

## 2017-03-09 ENCOUNTER — Encounter: Payer: Self-pay | Admitting: Family Medicine

## 2017-03-09 ENCOUNTER — Ambulatory Visit (INDEPENDENT_AMBULATORY_CARE_PROVIDER_SITE_OTHER): Payer: Medicare HMO | Admitting: Family Medicine

## 2017-03-09 DIAGNOSIS — T63461A Toxic effect of venom of wasps, accidental (unintentional), initial encounter: Secondary | ICD-10-CM | POA: Diagnosis not present

## 2017-03-09 MED ORDER — PREDNISONE 20 MG PO TABS: 40.0000 mg | ORAL_TABLET | Freq: Every day | ORAL | 0 refills | Status: DC

## 2017-03-09 MED ORDER — DOXYCYCLINE HYCLATE 100 MG PO TABS: 100.0000 mg | ORAL_TABLET | Freq: Two times a day (BID) | ORAL | 0 refills | Status: DC

## 2017-03-09 NOTE — Progress Notes (Signed)
[name], MD Phone: [phone]  [name] is a 70 y.o. male who presents today for same-day visit.  Patient reports he was stung by a yellow jacket on his left hand on the dorsal aspect on Friday. Started to swell after this and got worse over the weekend and developed some blisters on Saturday. Had swelling up to the mid forearm. He popped several of the blisters. He notes he's been taking Benadryl for this. The swelling has decreased over the last 1-2 days. Has subsequently developed a slight erythema extending into his left index finger and over his left radial aspect hand at the edges of the blisters that he popped. There is no purulent drainage. There are several other blisters that appear to have serous fluid. There is no pain. He notes he is able to move his hand better now but the swelling has gone down. He has no history of blood clot. He is on Eliquis. He notes the blood flow to his hand is good. He reports a less severe reaction in the right hand when stung by a wasp several weeks prior.  PMH: Former smoker   ROS see history of present illness  Objective  Physical Exam Vitals:   03/09/17 1148  BP: 138/70  Pulse: 67  Temp: 98.5 F (36.9 C)  SpO2: 97%    BP Readings from Last 3 Encounters:  03/09/17 138/70  02/17/17 118/90  02/11/17 123/74   Wt Readings from Last 3 Encounters:  03/09/17 224 lb 12.8 oz (102 kg)  02/17/17 224 lb 8 oz (101.8 kg)  02/11/17 224 lb 9.6 oz (101.9 kg)    Physical Exam  Constitutional: No distress.  Cardiovascular: Normal rate, regular rhythm and normal heart sounds.   Pulmonary/Chest: Effort normal and breath sounds normal.  Musculoskeletal:  Left hand to mid forearm with mild to moderate swelling, slight blistering with serous fluid scattered over radial aspect left hand dorsally, there is erythema on the dorsal aspect of his proximal left index finger and over the radial aspect dorsal left hand at the edges of 2 blisters  that the patient has popped, there is no purulent discharge, no induration, there is warmth, no tenderness, full range of motion left hand, hand is warm and well perfused with 2+ radial pulse  Neurological: He is alert. Gait normal.  5/5 grip strength bilateral hands, sensation intact bilateral hands  Skin: Skin is warm and dry. He is not diaphoretic.     Assessment/Plan: Please see individual problem list.  Yellow jacket sting Patient with large local reaction from yellowjacket sting. Swelling is getting better per his report. Has responded well to Benadryl. Neurovascularly intact. Possible component of cellulitis given new onset erythema after swelling started to go down. Discussed coverage with prednisone and also doxycycline given new onset erythema after the swelling started to go down. Discussed return precautions. Will recheck in 2 days.   No orders of the defined types were placed in this encounter.   Meds ordered this encounter  Medications  . predniSONE (DELTASONE) 20 MG tablet    Sig: Take 2 tablets (40 mg total) by mouth daily with breakfast.    Dispense:  10 tablet    Refill:  0  . doxycycline (VIBRA-TABS) 100 MG tablet    Sig: Take 1 tablet (100 mg total) by mouth 2 (two) times daily.    Dispense:  14 tablet    Refill:  0   [name], MD [place]

## 2017-03-09 NOTE — Telephone Encounter (Signed)
Pt states he was sting by a yellow jacket on Friday. Rt arm is swollen and very red. Developed blisters over the weekend around the sting

## 2017-03-09 NOTE — Patient Instructions (Signed)
Nice to see you. We will place you on prednisone and doxycycline to cover for local allergic reaction and possible skin infection. If you develop fevers, spreading redness, increased swelling, or any new or changing symptoms please seek medical attention immediately.

## 2017-03-09 NOTE — Assessment & Plan Note (Signed)
Patient with large local reaction from yellowjacket sting. Swelling is getting better per his report. Has responded well to Benadryl. Neurovascularly intact. Possible component of cellulitis given new onset erythema after swelling started to go down. Discussed coverage with prednisone and also doxycycline given new onset erythema after the swelling started to go down. Discussed return precautions. Will recheck in 2 days.

## 2017-03-09 NOTE — Progress Notes (Signed)
Patient came into office complaint of bee sting to left han on Friday , now has blistering and swelling. Advised PCP was advised to add to schedule.

## 2017-03-11 ENCOUNTER — Ambulatory Visit (INDEPENDENT_AMBULATORY_CARE_PROVIDER_SITE_OTHER): Payer: Medicare HMO | Admitting: Family Medicine

## 2017-03-11 ENCOUNTER — Encounter: Payer: Self-pay | Admitting: Family Medicine

## 2017-03-11 DIAGNOSIS — T63461A Toxic effect of venom of wasps, accidental (unintentional), initial encounter: Secondary | ICD-10-CM | POA: Diagnosis not present

## 2017-03-11 NOTE — Assessment & Plan Note (Signed)
Significantly improved. Encouraged to complete the course of prednisone and doxycycline. If he has recurrence of symptoms will be reevaluated.

## 2017-03-11 NOTE — Patient Instructions (Signed)
Nice to see you. I am glad your left hand is improved. Please complete the course of antibiotics and steroids. If you have recurrent swelling or recurrent redness please seek medical attention.

## 2017-03-11 NOTE — Progress Notes (Signed)
  [name], MD Phone: [phone]  [name] is a 71 y.o. male who presents today for follow-up.  Patient was seen 2 days ago for left hand swelling following a yellow jacket sting. There was erythema and swelling noted. Concern is for large local allergic reaction in addition to possible cellulitis. Patient has been on doxycycline and prednisone. Notes the swelling and the erythema has improved significantly. He notes no swelling. Minimal erythema. No fevers. No pain.  ROS see history of present illness  Objective  Physical Exam Vitals:   03/11/17 0831  BP: (!) 144/82  Pulse: 82  Temp: 97.9 F (36.6 C)  SpO2: 97%    BP Readings from Last 3 Encounters:  03/11/17 (!) 144/82  03/09/17 138/70  02/17/17 118/90   Wt Readings from Last 3 Encounters:  03/11/17 225 lb (102.1 kg)  03/09/17 224 lb 12.8 oz (102 kg)  02/17/17 224 lb 8 oz (101.8 kg)    Physical Exam  Constitutional: No distress.  Pulmonary/Chest: Effort normal.  Musculoskeletal:  Left hand and forearm with no swelling, blisters have gone away, erythema is significantly improved and minimal at this time compared to 2 days ago, there is no tenderness, 5/5 strength bilateral grip, sensation intact to light touch bilateral hands, full range of motion bilateral hands  Skin: He is not diaphoretic.     Assessment/Plan: Please see individual problem list.  Yellow jacket sting Significantly improved. Encouraged to complete the course of prednisone and doxycycline. If he has recurrence of symptoms will be reevaluated.  [name], MD [place]

## 2017-03-17 DIAGNOSIS — D2261 Melanocytic nevi of right upper limb, including shoulder: Secondary | ICD-10-CM | POA: Diagnosis not present

## 2017-03-17 DIAGNOSIS — D485 Neoplasm of uncertain behavior of skin: Secondary | ICD-10-CM | POA: Diagnosis not present

## 2017-03-17 DIAGNOSIS — C44629 Squamous cell carcinoma of skin of left upper limb, including shoulder: Secondary | ICD-10-CM | POA: Diagnosis not present

## 2017-03-17 DIAGNOSIS — X32XXXA Exposure to sunlight, initial encounter: Secondary | ICD-10-CM | POA: Diagnosis not present

## 2017-03-17 DIAGNOSIS — D225 Melanocytic nevi of trunk: Secondary | ICD-10-CM | POA: Diagnosis not present

## 2017-03-17 DIAGNOSIS — Z85828 Personal history of other malignant neoplasm of skin: Secondary | ICD-10-CM | POA: Diagnosis not present

## 2017-03-17 DIAGNOSIS — L570 Actinic keratosis: Secondary | ICD-10-CM | POA: Diagnosis not present

## 2017-03-17 DIAGNOSIS — D2272 Melanocytic nevi of left lower limb, including hip: Secondary | ICD-10-CM | POA: Diagnosis not present

## 2017-04-06 ENCOUNTER — Other Ambulatory Visit: Payer: Self-pay | Admitting: Cardiovascular Disease

## 2017-04-29 DIAGNOSIS — C44629 Squamous cell carcinoma of skin of left upper limb, including shoulder: Secondary | ICD-10-CM | POA: Diagnosis not present

## 2017-04-29 DIAGNOSIS — L905 Scar conditions and fibrosis of skin: Secondary | ICD-10-CM | POA: Diagnosis not present

## 2017-05-17 NOTE — Progress Notes (Signed)
. Cardiology Office Note  Date:  05/18/2017   ID:  [name], DOB 13-Jan-1946, MRN [id number]  PCP:  Leone Haven, MD   Chief Complaint  Patient presents with  . other    6 month follow up. Meds. reviewed by the pt. verbally. "doing well."     HPI:  [name] is a 71 year old gentleman with a history of  smoking, coronary artery disease,  cabg,  catheterization March 2011 with occluded vein graft 2, patent LIMA, collaterals from left to right,  atrial fibrillation March 2013, 10/11/2014. Converted to normal with metoprolol,  Possible conversion pause. 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  Denies any chest pain concerning for angina Blood pressure well controlled at home  No significant tachycardia concerning for atrial fibrillation Tolerating anticoagulation  EKG personally reviewed by myself on todays visit Shows normal sinus rhythm rate 61 bpm no significant ST or T wave changes  Other past medical history reviewed Left total hip on the right 10/22/16  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, no mode switches as of February 2018  fell off a ladder beginning of October 2017 Hurt his tailbone, seen by orthopedics locally, told to start physical therapy Symptoms got worse, and he had to stop physical therapy He went to a [facility] for second opinion Seen in the emergency room, CT at [facility]: 04/25/2016 showing large hematoma Right gluteal intramuscular hematoma  measuring 9.6 x 6.8 x 4.9 cm [facility] stopped eliquis, statin (unclear why they stopped his statin) Has been off the coagulation  since that time,  Hematoma has slowly improved, pain dramatically improved Not at his baseline yet  Working with the [facility] for PTSD, reports feeling well, stable Reports that he is been compliant with his Lasix, taking 80 mg daily which is higher dose than before for the past 5 days, no improvement in his leg swelling  atrial fibrillation/flutter starting September 12 2015, then persistent flutter In the clinic his heart rate was 126 bpm Amiodarone dosing was increased, amlodipine was changed to diltiazem 120 mg daily, metoprolol increased up to 25 mill grams twice a day Converted to normal sinus rhythm at home  tachycardia concerning for atrial fibrillation 05/10/2015. Lasted only several minutes Rate possibly up to 140 bpm.  Previously has been very active, building a barn, tool shed, taking care of animals some benefits from the [place] for agent orange exposure and his cancer  total cholesterol 137, LDL 86, earlier in the year  chronic leg pain since he was young.  Previous leg pain with Crestor  admitted to the hospital on September 26 2011 for malaise, appearing pale, irregular heart rhythm and noted to be in atrial fibrillation. He was given medication for rhythm control and he converted to normal sinus rhythm later that evening. No longer on anticoagulation. He has been maintaining normal sinus rhythm No symptoms concerning for atrial fibrillation since that time  Echocardiogram showed ejection fraction 35-45%, mildly elevated right ventricular systolic pressures estimated at 30-40 mm mercury.  Previous catheterization showing occluded LAD in the mid vessel, 60% left main, 99% distal RCA who was sent  to [facility] in [facility] 2010 for bypass surgery. He received 3 vessel bypass by Dr.Gerhardt with a LIMA to the LAD, vein graft to the OM1 and vein graft to the PDA, with subsequent cardiac catheter March 2011 for chest discomfort showing occluded vein grafts x2 with patent  LIMA. He has collateral vessels from left to right. Ejection fraction 55% mild anterolateral hypokinesis.  PMH:   has a past medical history of Anxiety; Arthritis; Atrial fibrillation ([place]); Chicken pox; Chronic lymphocytic leukemia ([place]); Complication of anesthesia; COPD (chronic obstructive pulmonary disease) ([place]); Coronary artery disease; History of chemotherapy (2015-2016); HOH (hard of hearing); Hypertension; Myocardial infarction ([place]) (2010); OSA on CPAP; Presence of permanent cardiac pacemaker; PTSD (post-traumatic stress disorder); Pure hypercholesterolemia; and Rheumatic fever (1959).  PSH:    Past Surgical History:  Procedure Laterality Date  . ABDOMINAL HERNIA REPAIR    . APPENDECTOMY  06/21/1985  . CARDIAC CATHETERIZATION  2010; 2011   ; Dr Fletcher Anon  . CORONARY ARTERY BYPASS GRAFT  04/2009   "CABG X3"  . EP IMPLANTABLE DEVICE N/A 03/03/2016   Procedure: Pacemaker Implant;  Surgeon: Deboraha Sprang, MD;  Location: [facility];  Service: Cardiovascular;  Laterality: N/A;  . FOREIGN BODY REMOVAL  1968   "shrapnel in my tailbone"  . INGUINAL HERNIA REPAIR Right   . INSERT / REPLACE / REMOVE PACEMAKER    . LAPAROSCOPIC CHOLECYSTECTOMY    . TONSILLECTOMY AND ADENOIDECTOMY  1956  . TOTAL HIP ARTHROPLASTY Right 10/22/2016   Procedure: TOTAL HIP ARTHROPLASTY;  Surgeon: Dereck Leep, MD;  Location: [facility];  Service: Orthopedics;  Laterality: Right;    Current Outpatient Prescriptions  Medication Sig Dispense Refill  . acetaminophen (TYLENOL) 500 MG tablet Take 1,000 mg by mouth every 8 (eight) hours as needed for mild pain.    Marland Kitchen albuterol (PROVENTIL HFA;VENTOLIN HFA) 108 (90 Base) MCG/ACT inhaler Inhale 2 puffs into the lungs every 6 (six) hours as needed for wheezing or shortness of breath. 1 Inhaler 11  . amiodarone (PACERONE) 200 MG tablet Take 100 mg by mouth 2 (two) times daily.    Marland Kitchen apixaban (ELIQUIS) 5 MG TABS tablet Take 1 tablet (5 mg total) by mouth 2 (two) times  daily. 180 tablet 3  . atorvastatin (LIPITOR) 80 MG tablet Take 1 tablet (80 mg total) by mouth at bedtime. 90 tablet 3  . budesonide-formoterol (SYMBICORT) 160-4.5 MCG/ACT inhaler Inhale 2 puffs into the lungs 2 (two) times daily. 1 Inhaler 11  . cetirizine (ZYRTEC) 10 MG tablet Take 10 mg by mouth daily.    . Coenzyme Q10 (CO Q-10) 100 MG CAPS Take 200 mg by mouth at bedtime.     Marland Kitchen ezetimibe (ZETIA) 10 MG tablet Take 1 tablet (10 mg total) by mouth daily. 90 tablet 3  . furosemide (LASIX) 20 MG tablet Take 20 mg by mouth 2 (two) times daily as needed for fluid (takes with metolazone  for fluid retention).     Javier Docker Oil 350 MG CAPS Take 350 mg by mouth every evening.    . metolazone (ZAROXOLYN) 5 MG tablet Take 5 mg by mouth 2 (two) times daily as needed. Take with furosemide as needed for fluid retention    . metoprolol tartrate (LOPRESSOR) 25 MG tablet Take 1 tablet (25 mg total) by mouth 2 (two) times daily as needed. 180 tablet 3  . mirtazapine (REMERON) 15 MG tablet Take 15 mg by mouth at bedtime as needed.    . Multiple  Vitamin (MULTIVITAMIN WITH MINERALS) TABS tablet Take 1 tablet by mouth daily.    . nitroGLYCERIN (NITROSTAT) 0.4 MG SL tablet Place 1 tablet (0.4 mg total) under the tongue every 5 (five) minutes as needed for chest pain. 25 tablet 6  . tiotropium (SPIRIVA) 18 MCG inhalation capsule Place 1 capsule (18 mcg total) into inhaler and inhale at bedtime. 30 capsule 11  . traMADol (ULTRAM) 50 MG tablet Take 1 tablet (50 mg total) by mouth every 12 (twelve) hours as needed for moderate pain. 90 tablet 0   No current facility-administered medications for this visit.      Allergies:   Patient has no known allergies.   Social History:  The patient  reports that he quit smoking about 10 years ago. His smoking use included Cigarettes. He has a 40.00 pack-year smoking history. He has never used smokeless tobacco. He reports that he does not drink alcohol or use drugs.   Family  History:   family history includes Coronary artery disease in his unknown relative; Heart disease in his mother.    Review of Systems: Review of Systems  Constitutional: Negative.   Respiratory: Negative.   Cardiovascular: Negative.   Gastrointestinal: Negative.   Musculoskeletal: Negative.        Periodic leg pain  Neurological: Negative.   Psychiatric/Behavioral: Negative.   All other systems reviewed and are negative.    PHYSICAL EXAM: VS:  BP 140/88 (BP Location: Left Arm, Patient Position: Sitting, Cuff Size: Normal)   Pulse 61   Ht 5\' 9"  (1.753 m)   Wt 224 lb 8 oz (101.8 kg)   BMI 33.15 kg/m  , BMI Body mass index is 33.15 kg/m. GEN: Well nourished, well developed, in no acute distress  HEENT: normal  Neck: no JVD, carotid bruits, or masses Cardiac: RRR; no murmurs, rubs, or gallops,no edema  Respiratory:  clear to auscultation bilaterally, normal work of breathing GI: soft, nontender, nondistended, + BS MS: no deformity or atrophy  Skin: warm and dry, no rash Neuro:  Strength and sensation are intact Psych: euthymic mood, full affect    Recent Labs: 11/17/2016: TSH 1.150 02/11/2017: ALT 29; BUN 11; Creatinine, Ser 0.99; Hemoglobin 13.2; Platelets 145; Potassium 3.6; Sodium 138    Lipid Panel Lab Results  Component Value Date   CHOL 149 11/17/2016   HDL 39 (L) 11/17/2016   LDLCALC 84 11/17/2016   TRIG 128 11/17/2016      Wt Readings from Last 3 Encounters:  05/18/17 224 lb 8 oz (101.8 kg)  03/11/17 225 lb (102.1 kg)  03/09/17 224 lb 12.8 oz (102 kg)       ASSESSMENT AND PLAN: Atrial fibrillation, unspecified type ([place]) - Plan: EKG 12-Lead Maintaining normal sinus rhythm, We'll continue amiodarone, metoprolol  Anticoagulation, no complications Continue current medication regiment  HYPERCHOLESTEROLEMIA Lipitor80 mg daily, zetia daily Goal LDL less than 70 Lab work ordered for today Periodic leg pain, possibly from the Lipitor  Essential  hypertension Blood pressure well controlled Stable, no medication changes  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 with angina pectoris with documented spasm ([place]) Denies any recent symptoms concerning for angina No further testing at this time, stable  Chronic diastolic CHF (congestive heart failure) ([place]) Currently not taking Lasix on a regular basis.  Appears euvolemic on today's visit  Centrilobular emphysema ([place]) Takes several inhalers, reports his breathing is stable. Recommended weight loss, regular walking program  Sinus pause  pacemaker placement Stable   Total encounter  time more than 25 minutes  Greater than 50% was spent in counseling and coordination of care with the patient   Disposition:   F/U  6 months   No orders of the defined types were placed in this encounter.    Signed, Esmond Plants, M.D., Ph.D. 05/18/2017  [place], [place]

## 2017-05-18 ENCOUNTER — Encounter: Payer: Self-pay | Admitting: Cardiovascular Disease

## 2017-05-18 ENCOUNTER — Ambulatory Visit (INDEPENDENT_AMBULATORY_CARE_PROVIDER_SITE_OTHER): Payer: Medicare HMO | Admitting: Cardiovascular Disease

## 2017-05-18 VITALS — BP 140/88 | HR 61 | Ht 69.0 in | Wt 224.5 lb

## 2017-05-18 DIAGNOSIS — I10 Essential (primary) hypertension: Secondary | ICD-10-CM

## 2017-05-18 DIAGNOSIS — I480 Paroxysmal atrial fibrillation: Secondary | ICD-10-CM

## 2017-05-18 DIAGNOSIS — Z9989 Dependence on other enabling machines and devices: Secondary | ICD-10-CM

## 2017-05-18 DIAGNOSIS — E782 Mixed hyperlipidemia: Secondary | ICD-10-CM | POA: Diagnosis not present

## 2017-05-18 DIAGNOSIS — G4733 Obstructive sleep apnea (adult) (pediatric): Secondary | ICD-10-CM | POA: Diagnosis not present

## 2017-05-18 DIAGNOSIS — J432 Centrilobular emphysema: Secondary | ICD-10-CM | POA: Diagnosis not present

## 2017-05-18 DIAGNOSIS — I25708 Atherosclerosis of coronary artery bypass graft(s), unspecified, with other forms of angina pectoris: Secondary | ICD-10-CM

## 2017-05-18 DIAGNOSIS — I739 Peripheral vascular disease, unspecified: Secondary | ICD-10-CM

## 2017-05-18 DIAGNOSIS — I495 Sick sinus syndrome: Secondary | ICD-10-CM | POA: Diagnosis not present

## 2017-05-18 DIAGNOSIS — I5032 Chronic diastolic (congestive) heart failure: Secondary | ICD-10-CM

## 2017-05-18 NOTE — Patient Instructions (Addendum)
Medication Instructions:   No medication changes made  Labwork:  We will check liver and lipids today  Testing/Procedures:  No further testing at this time   Follow-Up: It was a pleasure seeing you in the office today. Please call us if you have new issues that need to be addressed before your next appt.  [phone]  Your physician wants you to follow-up in: 6 months.  You will receive a reminder letter in the mail two months in advance. If you don't receive a letter, please call our office to schedule the follow-up appointment.  If you need a refill on your cardiac medications before your next appointment, please call your pharmacy.

## 2017-05-19 ENCOUNTER — Ambulatory Visit (INDEPENDENT_AMBULATORY_CARE_PROVIDER_SITE_OTHER): Payer: Medicare HMO | Admitting: *Deleted

## 2017-05-19 DIAGNOSIS — I495 Sick sinus syndrome: Secondary | ICD-10-CM

## 2017-05-19 LAB — HEPATIC FUNCTION PANEL
ALBUMIN: 4.3 g/dL (ref 3.5–4.8)
ALT: 21 IU/L (ref 0–44)
AST: 16 IU/L (ref 0–40)
Alkaline Phosphatase: 84 IU/L (ref 39–117)
BILIRUBIN TOTAL: 0.7 mg/dL (ref 0.0–1.2)
Bilirubin, Direct: 0.21 mg/dL (ref 0.00–0.40)
TOTAL PROTEIN: 6.7 g/dL (ref 6.0–8.5)

## 2017-05-19 LAB — LIPID PANEL
CHOL/HDL RATIO: 2.4 ratio (ref 0.0–5.0)
Cholesterol, Total: 113 mg/dL (ref 100–199)
HDL: 47 mg/dL (ref >39)
LDL CALC: 53 mg/dL (ref 0–99)
Triglycerides: 64 mg/dL (ref 0–149)
VLDL CHOLESTEROL CAL: 13 mg/dL (ref 5–40)

## 2017-05-19 NOTE — Progress Notes (Signed)
Remote pacemaker transmission.   

## 2017-05-20 LAB — CUP PACEART REMOTE DEVICE CHECK
Battery Remaining Longevity: 99 mo
Battery Voltage: 3.01 V
Brady Statistic RA Percent Paced: 98.67 %
Implantable Lead Implant Date: 20170814
Implantable Lead Location: 753859
Implantable Lead Model: 5076
Implantable Lead Model: 5076
Implantable Pulse Generator Implant Date: 20170814
Lead Channel Impedance Value: 323 Ohm
Lead Channel Pacing Threshold Amplitude: 1 V
Lead Channel Pacing Threshold Pulse Width: 0.4 ms
Lead Channel Pacing Threshold Pulse Width: 0.4 ms
Lead Channel Sensing Intrinsic Amplitude: 3.75 mV
Lead Channel Sensing Intrinsic Amplitude: 3.75 mV
Lead Channel Setting Pacing Amplitude: 2 V
MDC IDC LEAD IMPLANT DT: 20170814
MDC IDC LEAD LOCATION: 753860
MDC IDC MSMT LEADCHNL RA IMPEDANCE VALUE: 456 Ohm
MDC IDC MSMT LEADCHNL RV IMPEDANCE VALUE: 456 Ohm
MDC IDC MSMT LEADCHNL RV IMPEDANCE VALUE: 570 Ohm
MDC IDC MSMT LEADCHNL RV PACING THRESHOLD AMPLITUDE: 0.875 V
MDC IDC MSMT LEADCHNL RV SENSING INTR AMPL: 24.25 mV
MDC IDC MSMT LEADCHNL RV SENSING INTR AMPL: 24.25 mV
MDC IDC SESS DTM: 20181030165724
MDC IDC SET LEADCHNL RV PACING AMPLITUDE: 2.5 V
MDC IDC SET LEADCHNL RV PACING PULSEWIDTH: 0.4 ms
MDC IDC SET LEADCHNL RV SENSING SENSITIVITY: 0.9 mV
MDC IDC STAT BRADY AP VP PERCENT: 0.13 %
MDC IDC STAT BRADY AP VS PERCENT: 98.55 %
MDC IDC STAT BRADY AS VP PERCENT: 0 %
MDC IDC STAT BRADY AS VS PERCENT: 1.33 %
MDC IDC STAT BRADY RV PERCENT PACED: 0.13 %

## 2017-05-22 ENCOUNTER — Telehealth: Payer: Self-pay | Admitting: Cardiovascular Disease

## 2017-05-22 NOTE — Telephone Encounter (Signed)
Received forms from [facility] for device information and instructions for upcoming colonoscopy scheduled for 06/19/17. Will fax these forms over to [facility] for review in the [place] office.

## 2017-05-27 ENCOUNTER — Encounter: Payer: Self-pay | Admitting: Cardiology

## 2017-06-13 ENCOUNTER — Other Ambulatory Visit: Payer: Self-pay | Admitting: Cardiovascular Disease

## 2017-07-03 ENCOUNTER — Telehealth: Payer: Self-pay | Admitting: Cardiovascular Disease

## 2017-07-03 MED ORDER — AMLODIPINE BESYLATE 10 MG PO TABS: 5.0000 mg | ORAL_TABLET | Freq: Every day | ORAL | Status: DC

## 2017-07-03 NOTE — Telephone Encounter (Signed)
Pt calling stating that when his BP goes up he has a nose bleed  He states it has been going on for about two days now.  He has it under control but is worried for this is not normal   07/03/17 169/84  169/93    Please advise

## 2017-07-03 NOTE — Telephone Encounter (Signed)
Spoke with the patient.  He states that over the last week, his SBP has been running upper 160's to 170's.  He has had several nose bleeds over the last week, with the most recent being this morning- 2 that lasted about an hour each. Blood is bright red with no clots noted. He denies headaches, but states "I don't feel good." Per his report, his BP has been slowly trending up for him. He is currently on metoprolol tartrate 25 mg BID. He was on amlodipine 10 mg- 1/2 tablet once daily back in July, but this was stopped by Dr. Caryl Comes. Per the patient, Dr. Caryl Comes discussed this with Dr. Rockey Situ prior to stopping and they were both agreeable as BP's were well controlled at the time (118/90 in the office).   Confirmed with the patient that he still has amlodipine 10 mg tablets at home. I advised him I will review with Dr. Rockey Situ for further recommendations and call him back. He is agreeable and states "I will just be laying around."

## 2017-07-03 NOTE — Telephone Encounter (Signed)
Reviewed with Dr. Rockey Situ- ok to restart amlodipine 10 mg- take 1/2 tablet (5 mg) once daily. Use saline spray and humidifier as well.  I have notified the patient of the above recommendations and he is agreeable. He will call us back if symptoms/ BP do not improve with addition of amlodipine.

## 2017-07-22 ENCOUNTER — Encounter: Payer: Self-pay | Admitting: Pulmonary Disease

## 2017-07-22 ENCOUNTER — Ambulatory Visit: Payer: Medicare HMO | Admitting: Pulmonary Disease

## 2017-07-22 VITALS — BP 128/72 | HR 66 | Ht 69.0 in | Wt 226.0 lb

## 2017-07-22 DIAGNOSIS — R040 Epistaxis: Secondary | ICD-10-CM

## 2017-07-22 DIAGNOSIS — J449 Chronic obstructive pulmonary disease, unspecified: Secondary | ICD-10-CM

## 2017-07-22 NOTE — Progress Notes (Signed)
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 Probable mild obstruction (FEV1 70% pred) with gas trapping (RV 134% pred) and mild-mod decrease in DLCO PSG (07/25/15): Overall AHI < 10/hr but > 100/hr in supine position. ROV (07/30/15): Dyspnea on exertion is moderately improved with scheduled use of Symbicort. He is also on Spiriva, PRN albuterol with rare use of albuterol MDI. PSG results not available. Continue same COPD regimen CPAP titration (08/29/15): recommend full mask with CPAP 8 cm H2O CPAP compliance 11/26-12/25/18: Usage 30/30. Greater than 4 hrs: 27/30.   Events since last visit: None   SUBJ : This is a routine reevaluation.  He has no new complaints.  He is extremely compliant with CPAP.  He believes that it is extremely beneficial.  He remains on Symbicort and Spiriva. He is using his albuterol rescue inhaler 0-2 times per day with relief of symptoms. Denies CP, fever, purulent sputum, hemoptysis, LE edema and calf tenderness.  He does report frequent nosebleeds in the last couple of weeks.  He has no difficulty getting these to stop when they occur.   Current Outpatient Medications:  .  acetaminophen (TYLENOL) 500 MG tablet, Take 1,000 mg by mouth every 8 (eight) hours as needed for mild pain., Disp: , Rfl:  .  albuterol (PROVENTIL HFA;VENTOLIN HFA) 108 (90 Base) MCG/ACT inhaler, Inhale 2 puffs into the lungs every 6 (six) hours as needed for wheezing or shortness of breath., Disp: 1 Inhaler, Rfl: 11 .  amiodarone (PACERONE) 200 MG tablet, Take 100 mg by mouth 2 (two) times daily., Disp: , Rfl:  .  amLODipine (NORVASC) 10 MG tablet, Take 0.5 tablets (5 mg total) by mouth daily., Disp:  , Rfl:  .  apixaban (ELIQUIS) 5 MG TABS tablet, Take 1 tablet (5 mg total) by mouth 2 (two) times daily., Disp: 180 tablet, Rfl: 3 .  atorvastatin (LIPITOR) 80 MG tablet, Take 1 tablet (80 mg total) by mouth at bedtime., Disp: 90 tablet, Rfl: 3 .  budesonide-formoterol (SYMBICORT) 160-4.5 MCG/ACT inhaler, Inhale 2 puffs into the lungs 2 (two) times daily., Disp: 1 Inhaler, Rfl: 11 .  cetirizine (ZYRTEC) 10 MG tablet, Take 10 mg by mouth daily., Disp: , Rfl:  .  Coenzyme Q10 (CO Q-10) 100 MG CAPS, Take 200 mg by mouth at bedtime. , Disp: , Rfl:  .  furosemide (LASIX) 20 MG tablet, Take 20 mg by mouth 2 (two) times daily as needed for fluid (takes with metolazone  for fluid retention). , Disp: , Rfl:  .  Krill Oil 350 MG CAPS, Take 350 mg by mouth every evening., Disp: , Rfl:  .  metolazone (ZAROXOLYN) 5 MG tablet, Take 5 mg by mouth 2 (two) times daily as needed. Take with furosemide as needed for fluid retention, Disp: , Rfl:  .  metoprolol tartrate (LOPRESSOR) 25 MG tablet, Take 1 tablet (25 mg total) by mouth 2 (two) times daily as needed., Disp: 180 tablet, Rfl: 3 .  mirtazapine (REMERON) 15 MG tablet, Take 15 mg by mouth at bedtime as needed., Disp: , Rfl:  .  Multiple Vitamin (MULTIVITAMIN WITH MINERALS) TABS tablet, Take 1 tablet by mouth daily., Disp: , Rfl:  .  nitroGLYCERIN (NITROSTAT) 0.4 MG SL tablet, Place  1 tablet (0.4 mg total) under the tongue every 5 (five) minutes as needed for chest pain., Disp: 25 tablet, Rfl: 6 .  tiotropium (SPIRIVA) 18 MCG inhalation capsule, Place 1 capsule (18 mcg total) into inhaler and inhale at bedtime., Disp: 30 capsule, Rfl: 11 .  traMADol (ULTRAM) 50 MG tablet, Take 1 tablet (50 mg total) by mouth every 12 (twelve) hours as needed for moderate pain., Disp: 90 tablet, Rfl: 0 .  ezetimibe (ZETIA) 10 MG tablet, Take 1 tablet (10 mg total) by mouth daily., Disp: 90 tablet, Rfl: 3    Vitals:   07/22/17 0957 07/22/17 1003  BP:  128/72  Pulse:  66  SpO2:   97%  Weight: 102.5 kg (226 lb)   Height: 5\' 9"  (1.753 m)   RA  EXAM:  Gen: WDWN, NAD HEENT: NCAT, oropharynx normal, nasal mucosa appears normal Neck: Supple without JVD Lungs: breath sounds mildly decreased, no wheezes Cardiovascular: Reg, no M noted  Abdomen: Soft, nontender, normal BS Ext: Trace right ankle edema Neuro: no focal deficits noted  DATA:   BMP Latest Ref Rng & Units 02/11/2017 10/24/2016 10/23/2016  Glucose 65 - 99 mg/dL 95 114(H) 129(H)  BUN 6 - 20 mg/dL 11 21(H) 18  Creatinine 0.61 - 1.24 mg/dL 0.99 1.02 0.89  Sodium 135 - 145 mmol/L 138 140 138  Potassium 3.5 - 5.1 mmol/L 3.6 3.6 3.6  Chloride 101 - 111 mmol/L 106 108 107  CO2 22 - 32 mmol/L 27 27 26   Calcium 8.9 - 10.3 mg/dL 8.8(L) 7.9(L) 7.7(L)    CBC Latest Ref Rng & Units 02/11/2017 10/24/2016 10/23/2016  WBC 3.8 - 10.6 K/uL 9.6 10.1 9.1  Hemoglobin 13.0 - 18.0 g/dL 13.2 10.8(L) 10.9(L)  Hematocrit 40.0 - 52.0 % 37.9(L) 31.3(L) 31.4(L)  Platelets 150 - 440 K/uL 145(L) 107(L) 112(L)   CXR: NNF    IMPRESSION:   Moderate chronic obstructive pulmonary disease -well controlled on current regimen OSA - well controlled on CPAP Epistaxis  PLAN:  Cont Symbicort, Spiriva  Continue PRN albuterol.  Continue CPAP as ordered For nosebleeds, I recommended a moisturizing nasal spray which can be purchased over-the-counter If nosebleeds persist, he is to call us and we will make referral to ENT ROV 6 months or sooner as needed   Merton Border, MD PCCM service Mobile [phone] Pager [phone] 07/22/2017 10:12 AM

## 2017-07-22 NOTE — Patient Instructions (Signed)
Cont Symbicort, Spiriva  Continue albuterol inhaler as needed.  You may use an advance of exertion Continue CPAP as ordered For nosebleeds, I recommended a moisturizing nasal spray which can be bought over-the-counter at any pharmacy If nosebleeds persist, contact us and we will make a referral to ENT Return in 6 months or sooner as needed

## 2017-08-05 DIAGNOSIS — G4733 Obstructive sleep apnea (adult) (pediatric): Secondary | ICD-10-CM | POA: Diagnosis not present

## 2017-08-11 DIAGNOSIS — I4891 Unspecified atrial fibrillation: Secondary | ICD-10-CM | POA: Diagnosis not present

## 2017-08-11 DIAGNOSIS — C8590 Non-Hodgkin lymphoma, unspecified, unspecified site: Secondary | ICD-10-CM | POA: Diagnosis not present

## 2017-08-11 DIAGNOSIS — J449 Chronic obstructive pulmonary disease, unspecified: Secondary | ICD-10-CM | POA: Diagnosis not present

## 2017-08-11 DIAGNOSIS — E785 Hyperlipidemia, unspecified: Secondary | ICD-10-CM | POA: Diagnosis not present

## 2017-08-11 DIAGNOSIS — G8929 Other chronic pain: Secondary | ICD-10-CM | POA: Diagnosis not present

## 2017-08-11 DIAGNOSIS — I509 Heart failure, unspecified: Secondary | ICD-10-CM | POA: Diagnosis not present

## 2017-08-11 DIAGNOSIS — R69 Illness, unspecified: Secondary | ICD-10-CM | POA: Diagnosis not present

## 2017-08-11 DIAGNOSIS — I110 Hypertensive heart disease with heart failure: Secondary | ICD-10-CM | POA: Diagnosis not present

## 2017-08-11 DIAGNOSIS — I2510 Atherosclerotic heart disease of native coronary artery without angina pectoris: Secondary | ICD-10-CM | POA: Diagnosis not present

## 2017-08-14 ENCOUNTER — Inpatient Hospital Stay (HOSPITAL_BASED_OUTPATIENT_CLINIC_OR_DEPARTMENT_OTHER): Payer: Medicare HMO | Admitting: Internal Medicine

## 2017-08-14 ENCOUNTER — Other Ambulatory Visit: Payer: Self-pay

## 2017-08-14 ENCOUNTER — Inpatient Hospital Stay: Payer: Medicare HMO | Attending: Internal Medicine

## 2017-08-14 VITALS — BP 146/79 | HR 67 | Temp 97.6°F | Resp 20

## 2017-08-14 DIAGNOSIS — I10 Essential (primary) hypertension: Secondary | ICD-10-CM | POA: Insufficient documentation

## 2017-08-14 DIAGNOSIS — I252 Old myocardial infarction: Secondary | ICD-10-CM

## 2017-08-14 DIAGNOSIS — I2510 Atherosclerotic heart disease of native coronary artery without angina pectoris: Secondary | ICD-10-CM | POA: Diagnosis not present

## 2017-08-14 DIAGNOSIS — G4730 Sleep apnea, unspecified: Secondary | ICD-10-CM | POA: Diagnosis not present

## 2017-08-14 DIAGNOSIS — Z9049 Acquired absence of other specified parts of digestive tract: Secondary | ICD-10-CM | POA: Diagnosis not present

## 2017-08-14 DIAGNOSIS — Z79899 Other long term (current) drug therapy: Secondary | ICD-10-CM | POA: Diagnosis not present

## 2017-08-14 DIAGNOSIS — C9110 Chronic lymphocytic leukemia of B-cell type not having achieved remission: Secondary | ICD-10-CM

## 2017-08-14 DIAGNOSIS — C9191 Lymphoid leukemia, unspecified, in remission: Secondary | ICD-10-CM | POA: Insufficient documentation

## 2017-08-14 DIAGNOSIS — R69 Illness, unspecified: Secondary | ICD-10-CM | POA: Diagnosis not present

## 2017-08-14 DIAGNOSIS — Z87891 Personal history of nicotine dependence: Secondary | ICD-10-CM | POA: Diagnosis not present

## 2017-08-14 DIAGNOSIS — F4310 Post-traumatic stress disorder, unspecified: Secondary | ICD-10-CM | POA: Diagnosis not present

## 2017-08-14 DIAGNOSIS — J449 Chronic obstructive pulmonary disease, unspecified: Secondary | ICD-10-CM

## 2017-08-14 DIAGNOSIS — I4891 Unspecified atrial fibrillation: Secondary | ICD-10-CM

## 2017-08-14 DIAGNOSIS — R419 Unspecified symptoms and signs involving cognitive functions and awareness: Secondary | ICD-10-CM

## 2017-08-14 DIAGNOSIS — Z7901 Long term (current) use of anticoagulants: Secondary | ICD-10-CM

## 2017-08-14 DIAGNOSIS — E7800 Pure hypercholesterolemia, unspecified: Secondary | ICD-10-CM | POA: Insufficient documentation

## 2017-08-14 LAB — BASIC METABOLIC PANEL
ANION GAP: 8 (ref 5–15)
BUN: 14 mg/dL (ref 6–20)
CALCIUM: 8.7 mg/dL — AB (ref 8.9–10.3)
CO2: 28 mmol/L (ref 22–32)
CREATININE: 0.97 mg/dL (ref 0.61–1.24)
Chloride: 104 mmol/L (ref 101–111)
GFR calc Af Amer: >60 mL/min (ref >60)
GLUCOSE: 95 mg/dL (ref 65–99)
Potassium: 3.7 mmol/L (ref 3.5–5.1)
Sodium: 140 mmol/L (ref 135–145)

## 2017-08-14 LAB — CBC WITH DIFFERENTIAL/PLATELET
BASOS PCT: 1 %
Basophils Absolute: 0.1 10*3/uL (ref 0–0.1)
EOS ABS: 0.3 10*3/uL (ref 0–0.7)
EOS PCT: 2 %
HEMATOCRIT: 39.4 % — AB (ref 40.0–52.0)
Hemoglobin: 13.2 g/dL (ref 13.0–18.0)
Lymphocytes Relative: 65 %
Lymphs Abs: 9.4 10*3/uL — ABNORMAL HIGH (ref 1.0–3.6)
MCH: 32.1 pg (ref 26.0–34.0)
MCHC: 33.5 g/dL (ref 32.0–36.0)
MCV: 95.9 fL (ref 80.0–100.0)
MONO ABS: 0.6 10*3/uL (ref 0.2–1.0)
Monocytes Relative: 4 %
NEUTROS ABS: 4.2 10*3/uL (ref 1.4–6.5)
Neutrophils Relative %: 28 %
PLATELETS: 123 10*3/uL — AB (ref 150–440)
RBC: 4.1 MIL/uL — ABNORMAL LOW (ref 4.40–5.90)
RDW: 14.7 % — AB (ref 11.5–14.5)
WBC: 14.7 10*3/uL — ABNORMAL HIGH (ref 3.8–10.6)

## 2017-08-14 LAB — LACTATE DEHYDROGENASE: LDH: 144 U/L (ref 98–192)

## 2017-08-14 NOTE — Assessment & Plan Note (Addendum)
#   CLL/SLL- status post bendamustine rituximab 6 cycles [finished in April 2016]- s/p maintenance rituximab every 2 months until March 2017.   # currently on surveillance 1 cm right submandibular lymph node  # CBC-within normal limits except slight elevated WBC- 15/ ALC- 9.  Monitor for now.  # Recommend follow-up in 6 months/CBC CMP and LDH.

## 2017-08-14 NOTE — Progress Notes (Signed)
[name] OFFICE PROGRESS NOTE  Patient Care Team: Leone Haven, MD as PCP - General (Family Medicine) Rockey Situ Kathlene November, MD as Consulting Physician (Cardiology)   SUMMARY OF ONCOLOGIC HISTORY:  Oncology History   # AUG 2015- SLL/CLL [name] [address] Bx] s/p Benda-Rituxan x6 [finished March 2016]; Maintenance Rituxan q 99m [started April 2016; Dr.Pandit];Last Ritux Jan 2017.  MARCH 2017- CT N/C/A/P- NED. STOP Ritux; surveillance    # s/p PPM [Dr.Klein; Sep 2017]; A.fib [on eliquis]     CLL (chronic lymphocytic leukemia) (HCC)    Initial Diagnosis    CLL (chronic lymphocytic leukemia) (HCC)        INTERVAL HISTORY:  A Michael 72 year old male patient with above history of SLL/CLL currently on surveillance is here for follow-up.   Patient's appetite is good. He denies any weight loss. Denies any profuse night sweats.  Denies any frequent infections. Denies any chest pain or cough.   REVIEW OF SYSTEMS:  A complete 10 point review of system is done which is negative except mentioned above/history of present illness.   PAST MEDICAL HISTORY :  Past Medical History:  Diagnosis Date  . Anxiety   . Arthritis   . Atrial fibrillation ([place])    a. Dx 2013, recurred 02/2014, CHA2DS2VASc = 3 -->placed on Eliquis;  b. 02/2014 Echo: EF 50-55%, mid and apical anterior septum and mid and apical inf septum are abnl, mild to mod Ao sclerosis w/o AS.  Marland Kitchen Chicken pox   . Chronic lymphocytic leukemia ([place])    a. Dx 02/2014.  Marland Kitchen Complication of anesthesia    History of  PTSD--do not touch patient when waking up from surgery.  Marland Kitchen COPD (chronic obstructive pulmonary disease) ([place])   . Coronary artery disease    a. 04/2009 CABG x 3 (LIMA->LAD, VG->OM1, VG->PDA);  b. 09/2009 Cath: occluded VG x 2 w/ patent LIMA and L->R collats. EF 55%, mild antlat HK;  c. 10/2011 MV: EF 53%, no isch/infarct-->low risk.  Marland Kitchen History of chemotherapy 2015-2016  . HOH (hard of hearing)    Bilateral  Hearing Aids  . Hypertension   . Myocardial infarction ([place]) 2010  . OSA on CPAP    USE C-PAP  . Presence of permanent cardiac pacemaker   . PTSD (post-traumatic stress disorder)   . Pure hypercholesterolemia   . Rheumatic fever 1959    PAST SURGICAL HISTORY :   Past Surgical History:  Procedure Laterality Date  . ABDOMINAL HERNIA REPAIR    . APPENDECTOMY  06/21/1985  . CARDIAC CATHETERIZATION  2010; 2011   ; Dr Fletcher Anon  . CORONARY ARTERY BYPASS GRAFT  04/2009   "CABG X3"  . EP IMPLANTABLE DEVICE N/A 03/03/2016   Procedure: Pacemaker Implant;  Surgeon: Deboraha Sprang, MD;  Location: [facility];  Service: Cardiovascular;  Laterality: N/A;  . FOREIGN BODY REMOVAL  1968   "shrapnel in my tailbone"  . INGUINAL HERNIA REPAIR Right   . INSERT / REPLACE / REMOVE PACEMAKER    . LAPAROSCOPIC CHOLECYSTECTOMY    . TONSILLECTOMY AND ADENOIDECTOMY  1956  . TOTAL HIP ARTHROPLASTY Right 10/22/2016   Procedure: TOTAL HIP ARTHROPLASTY;  Surgeon: Dereck Leep, MD;  Location: [facility];  Service: Orthopedics;  Laterality: Right;    FAMILY HISTORY :   Family History  Problem Relation Age of Onset  . Heart disease Mother   . Coronary artery disease Unknown        family history  SOCIAL HISTORY:   Social History   Tobacco Use  . Smoking status: Former Smoker    Packs/day: 1.00    Years: 40.00    Pack years: 40.00    Types: Cigarettes    Last attempt to quit: 07/21/2006    Years since quitting: 11.0  . Smokeless tobacco: Never Used  Substance Use Topics  . Alcohol use: No    Alcohol/week: 0.5 oz    Types: 1 Standard drinks or equivalent per week  . Drug use: No    ALLERGIES:  has No Known Allergies.  MEDICATIONS:  Current Outpatient Medications  Medication Sig Dispense Refill  . acetaminophen (TYLENOL) 500 MG tablet Take 1,000 mg by mouth every 8 (eight) hours as needed for mild pain.    Marland Kitchen albuterol (PROVENTIL HFA;VENTOLIN HFA) 108 (90 Base) MCG/ACT inhaler Inhale 2  puffs into the lungs every 6 (six) hours as needed for wheezing or shortness of breath. 1 Inhaler 11  . amiodarone (PACERONE) 200 MG tablet Take 100 mg by mouth 2 (two) times daily.    Marland Kitchen apixaban (ELIQUIS) 5 MG TABS tablet Take 1 tablet (5 mg total) by mouth 2 (two) times daily. 180 tablet 3  . atorvastatin (LIPITOR) 80 MG tablet Take 1 tablet (80 mg total) by mouth at bedtime. 90 tablet 3  . budesonide-formoterol (SYMBICORT) 160-4.5 MCG/ACT inhaler Inhale 2 puffs into the lungs 2 (two) times daily. 1 Inhaler 11  . cetirizine (ZYRTEC) 10 MG tablet Take 10 mg by mouth daily.    . Coenzyme Q10 (CO Q-10) 100 MG CAPS Take 200 mg by mouth at bedtime.     [name] Oil 350 MG CAPS Take 350 mg by mouth every evening.    . metoprolol tartrate (LOPRESSOR) 25 MG tablet Take 1 tablet (25 mg total) by mouth 2 (two) times daily as needed. 180 tablet 3  . mirtazapine (REMERON) 15 MG tablet Take 15 mg by mouth at bedtime as needed.     . Multiple Vitamin (MULTIVITAMIN WITH MINERALS) TABS tablet Take 1 tablet by mouth daily.    Marland Kitchen tiotropium (SPIRIVA) 18 MCG inhalation capsule Place 1 capsule (18 mcg total) into inhaler and inhale at bedtime. 30 capsule 11  . ezetimibe (ZETIA) 10 MG tablet Take 1 tablet (10 mg total) by mouth daily. 90 tablet 3  . nitroGLYCERIN (NITROSTAT) 0.4 MG SL tablet Place 1 tablet (0.4 mg total) under the tongue every 5 (five) minutes as needed for chest pain. (Patient not taking: Reported on 08/14/2017) 25 tablet 6  . traMADol (ULTRAM) 50 MG tablet Take 1 tablet (50 mg total) by mouth every 12 (twelve) hours as needed for moderate pain. (Patient not taking: Reported on 08/14/2017) 90 tablet 0   No current facility-administered medications for this visit.     PHYSICAL EXAMINATION: ECOG PERFORMANCE STATUS: 0 - Asymptomatic  BP (!) 146/79   Pulse 67   Temp 97.6 F (36.4 C) (Tympanic)   Resp 20   There were no vitals filed for this visit.  GENERAL: Well-nourished well-developed; Alert,  no distress and comfortable. He is with his wife. Walks with a cane.   EYES: no pallor or icterus OROPHARYNX: no thrush or ulceration; dentures.  NECK: supple, no masses felt LYMPH:  no palpable lymphadenopathy in the cervical, axillary or inguinal regions LUNGS: clear to auscultation and  No wheeze or crackles HEART/CVS: regular rate & rhythm and no murmurs; No lower extremity edema ABDOMEN:abdomen soft, non-tender and normal bowel sounds Musculoskeletal:no cyanosis of  digits and no clubbing  PSYCH: alert & oriented x 3 with fluent speech NEURO: no focal motor/sensory deficits SKIN:  no rashes or significant lesions  LABORATORY DATA:  I have reviewed the data as listed    Component Value Date/Time   NA 140 08/14/2017 1330   NA 139 10/11/2014 1800   K 3.7 08/14/2017 1330   K 3.3 (L) 10/11/2014 1800   CL 104 08/14/2017 1330   CL 106 10/11/2014 1800   CO2 28 08/14/2017 1330   CO2 27 10/11/2014 1800   GLUCOSE 95 08/14/2017 1330   GLUCOSE 107 (H) 10/11/2014 1800   BUN 14 08/14/2017 1330   BUN 15 10/11/2014 1800   CREATININE 0.97 08/14/2017 1330   CREATININE 0.89 10/11/2014 1800   CALCIUM 8.7 (L) 08/14/2017 1330   CALCIUM 8.8 (L) 10/11/2014 1800   PROT 6.7 05/18/2017 1048   PROT 6.4 (L) 10/11/2014 1800   ALBUMIN 4.3 05/18/2017 1048   ALBUMIN 4.1 10/11/2014 1800   AST 16 05/18/2017 1048   AST 23 10/11/2014 1800   ALT 21 05/18/2017 1048   ALT 22 10/11/2014 1800   ALKPHOS 84 05/18/2017 1048   ALKPHOS 61 10/11/2014 1800   BILITOT 0.7 05/18/2017 1048   BILITOT 0.9 10/11/2014 1800   GFRNONAA >60 08/14/2017 1330   GFRNONAA >60 10/11/2014 1800   GFRAA >60 08/14/2017 1330   GFRAA >60 10/11/2014 1800    No results found for: SPEP, UPEP  Lab Results  Component Value Date   WBC 14.7 (H) 08/14/2017   NEUTROABS 4.2 08/14/2017   HGB 13.2 08/14/2017   HCT 39.4 (L) 08/14/2017   MCV 95.9 08/14/2017   PLT 123 (L) 08/14/2017      Chemistry      Component Value Date/Time   NA  140 08/14/2017 1330   NA 139 10/11/2014 1800   K 3.7 08/14/2017 1330   K 3.3 (L) 10/11/2014 1800   CL 104 08/14/2017 1330   CL 106 10/11/2014 1800   CO2 28 08/14/2017 1330   CO2 27 10/11/2014 1800   BUN 14 08/14/2017 1330   BUN 15 10/11/2014 1800   CREATININE 0.97 08/14/2017 1330   CREATININE 0.89 10/11/2014 1800      Component Value Date/Time   CALCIUM 8.7 (L) 08/14/2017 1330   CALCIUM 8.8 (L) 10/11/2014 1800   ALKPHOS 84 05/18/2017 1048   ALKPHOS 61 10/11/2014 1800   AST 16 05/18/2017 1048   AST 23 10/11/2014 1800   ALT 21 05/18/2017 1048   ALT 22 10/11/2014 1800   BILITOT 0.7 05/18/2017 1048   BILITOT 0.9 10/11/2014 1800       ASSESSMENT & PLAN:   CLL (chronic lymphocytic leukemia) (HCC) # CLL/SLL- status post bendamustine rituximab 6 cycles [finished in April 2016]- s/p maintenance rituximab every 2 months until March 2017.   # currently on surveillance 1 cm right submandibular lymph node  # CBC-within normal limits except slight elevated WBC- 15/ ALC- 9.  Monitor for now.  # Recommend follow-up in 6 months/CBC CMP and LDH.     Cammie Sickle, MD 08/18/2017 8:27 AM

## 2017-08-18 ENCOUNTER — Telehealth: Payer: Self-pay | Admitting: Cardiology

## 2017-08-18 ENCOUNTER — Ambulatory Visit (INDEPENDENT_AMBULATORY_CARE_PROVIDER_SITE_OTHER): Payer: Medicare HMO | Admitting: *Deleted

## 2017-08-18 DIAGNOSIS — I495 Sick sinus syndrome: Secondary | ICD-10-CM | POA: Diagnosis not present

## 2017-08-18 NOTE — Telephone Encounter (Signed)
Spoke with pt and reminded pt of remote transmission that is due today. Pt verbalized understanding.   

## 2017-08-18 NOTE — Progress Notes (Signed)
Remote pacemaker transmission.   

## 2017-08-20 ENCOUNTER — Encounter: Payer: Self-pay | Admitting: Cardiology

## 2017-08-27 DIAGNOSIS — Z96641 Presence of right artificial hip joint: Secondary | ICD-10-CM | POA: Diagnosis not present

## 2017-08-27 DIAGNOSIS — M1612 Unilateral primary osteoarthritis, left hip: Secondary | ICD-10-CM | POA: Diagnosis not present

## 2017-09-01 DIAGNOSIS — Z08 Encounter for follow-up examination after completed treatment for malignant neoplasm: Secondary | ICD-10-CM | POA: Diagnosis not present

## 2017-09-01 DIAGNOSIS — D0462 Carcinoma in situ of skin of left upper limb, including shoulder: Secondary | ICD-10-CM | POA: Diagnosis not present

## 2017-09-01 DIAGNOSIS — D485 Neoplasm of uncertain behavior of skin: Secondary | ICD-10-CM | POA: Diagnosis not present

## 2017-09-01 DIAGNOSIS — L400 Psoriasis vulgaris: Secondary | ICD-10-CM | POA: Diagnosis not present

## 2017-09-01 DIAGNOSIS — Z85828 Personal history of other malignant neoplasm of skin: Secondary | ICD-10-CM | POA: Diagnosis not present

## 2017-09-02 LAB — CUP PACEART REMOTE DEVICE CHECK
Battery Remaining Longevity: 98 mo
Battery Voltage: 3.01 V
Brady Statistic AS VS Percent: 0.6 %
Brady Statistic RA Percent Paced: 99.39 %
Implantable Lead Implant Date: 20170814
Implantable Lead Implant Date: 20170814
Implantable Lead Location: 753859
Implantable Lead Model: 5076
Implantable Pulse Generator Implant Date: 20170814
Lead Channel Impedance Value: 342 Ohm
Lead Channel Impedance Value: 475 Ohm
Lead Channel Impedance Value: 513 Ohm
Lead Channel Pacing Threshold Amplitude: 0.875 V
Lead Channel Pacing Threshold Amplitude: 1 V
Lead Channel Pacing Threshold Pulse Width: 0.4 ms
Lead Channel Pacing Threshold Pulse Width: 0.4 ms
Lead Channel Sensing Intrinsic Amplitude: 3.5 mV
Lead Channel Setting Pacing Amplitude: 2 V
MDC IDC LEAD LOCATION: 753860
MDC IDC MSMT LEADCHNL RA SENSING INTR AMPL: 3.5 mV
MDC IDC MSMT LEADCHNL RV IMPEDANCE VALUE: 627 Ohm
MDC IDC MSMT LEADCHNL RV SENSING INTR AMPL: 23.75 mV
MDC IDC MSMT LEADCHNL RV SENSING INTR AMPL: 23.75 mV
MDC IDC SESS DTM: 20190129193324
MDC IDC SET LEADCHNL RV PACING AMPLITUDE: 2.5 V
MDC IDC SET LEADCHNL RV PACING PULSEWIDTH: 0.4 ms
MDC IDC SET LEADCHNL RV SENSING SENSITIVITY: 0.9 mV
MDC IDC STAT BRADY AP VP PERCENT: 0.13 %
MDC IDC STAT BRADY AP VS PERCENT: 99.27 %
MDC IDC STAT BRADY AS VP PERCENT: 0 %
MDC IDC STAT BRADY RV PERCENT PACED: 0.14 %

## 2017-09-28 DIAGNOSIS — D0462 Carcinoma in situ of skin of left upper limb, including shoulder: Secondary | ICD-10-CM | POA: Diagnosis not present

## 2017-10-20 ENCOUNTER — Telehealth: Payer: Self-pay | Admitting: Internal Medicine

## 2017-10-20 ENCOUNTER — Encounter
Admission: RE | Admit: 2017-10-20 | Discharge: 2017-10-20 | Disposition: A | Discharge status: Home / Self Care | Payer: Medicare HMO | Source: Ambulatory Visit | Attending: Orthopedic Surgery | Admitting: Orthopedic Surgery

## 2017-10-20 ENCOUNTER — Other Ambulatory Visit: Payer: Self-pay

## 2017-10-20 DIAGNOSIS — Z01812 Encounter for preprocedural laboratory examination: Secondary | ICD-10-CM | POA: Diagnosis present

## 2017-10-20 DIAGNOSIS — Z950 Presence of cardiac pacemaker: Secondary | ICD-10-CM | POA: Insufficient documentation

## 2017-10-20 DIAGNOSIS — R9431 Abnormal electrocardiogram [ECG] [EKG]: Secondary | ICD-10-CM | POA: Diagnosis not present

## 2017-10-20 DIAGNOSIS — Z01810 Encounter for preprocedural cardiovascular examination: Secondary | ICD-10-CM | POA: Diagnosis not present

## 2017-10-20 LAB — URINALYSIS, ROUTINE W REFLEX MICROSCOPIC
BACTERIA UA: NONE SEEN
Bilirubin Urine: NEGATIVE
Glucose, UA: NEGATIVE mg/dL
Ketones, ur: NEGATIVE mg/dL
Leukocytes, UA: NEGATIVE
Nitrite: NEGATIVE
PROTEIN: NEGATIVE mg/dL
SPECIFIC GRAVITY, URINE: 1.008 (ref 1.005–1.030)
Squamous Epithelial / LPF: NONE SEEN
pH: 7 (ref 5.0–8.0)

## 2017-10-20 LAB — SURGICAL PCR SCREEN
MRSA, PCR: NEGATIVE
Staphylococcus aureus: NEGATIVE

## 2017-10-20 NOTE — Telephone Encounter (Signed)
   [facility] Pre-operative Risk Assessment    Request for surgical clearance:  1. What type of surgery is being performed? Left total hip replacement  2. When is this surgery scheduled? 11/04/2017  3. What type of clearance is required (medical clearance vs. Pharmacy clearance to hold med vs. Both)? Not listed  4. Are there any medications that need to be held prior to surgery and how long? Not listed 5.  6. Practice name and name of physician performing surgery?  [facility] Pre admit, Dr. Marry Guan   What is your office phone and fax number?  Fax [phone]  7. Anesthesia type (None, local, MAC, general) ? Not listed   [name] 10/20/2017, 3:36 PM  _________________________________________________________________   (provider comments below)

## 2017-10-20 NOTE — Patient Instructions (Signed)
Your procedure is scheduled on: Wed. 11/04/17 Report to Day Surgery. To find out your arrival time please call [phone] between 1PM - 3PM on Tues 11/03/17.  Remember: Instructions that are not followed completely may result in serious medical risk, up to and including death, or upon the discretion of your surgeon and anesthesiologist your surgery may need to be rescheduled.     _X__ 1. Do not eat food after midnight the night before your procedure.                 No gum chewing or hard candies. You may drink clear liquids up to 2 hours                 before you are scheduled to arrive for your surgery- DO not drink clear                 liquids within 2 hours of the start of your surgery.                 Clear Liquids include:  water, apple juice without pulp, clear carbohydrate                 drink such as Clearfast of Gartorade, Black Coffee or Tea (Do not add                 anything to coffee or tea).  __X__2.  On the morning of surgery brush your teeth with toothpaste and water, you  may rinse your mouth with mouthwash if you wish.  Do not swallow any              toothpaste of mouthwash.     _X__ 3.  No Alcohol for 24 hours before or after surgery.   _X__ 4.  Do Not Smoke or use e-cigarettes For 24 Hours Prior to Your Surgery.                 Do not use any chewable tobacco products for at least 6 hours prior to                 surgery.  ____  5.  Bring all medications with you on the day of surgery if instructed.   _x___  6.  Notify your doctor if there is any change in your medical condition      (cold, fever, infections).     Do not wear jewelry, make-up, hairpins, clips or nail polish. Do not wear lotions, powders, or perfumes. You may wear deodorant. Do not shave 48 hours prior to surgery. Men may shave face and neck. Do not bring valuables to the hospital.    [facility] is not responsible for any belongings or valuables.  Contacts, dentures  or bridgework may not be worn into surgery. Leave your suitcase in the car. After surgery it may be brought to your room. For patients admitted to the hospital, discharge time is determined by your treatment team.   Patients discharged the day of surgery will not be allowed to drive home.   Please read over the following fact sheets that you were given:    __x__ Take these medicines the morning of surgery with A SIP OF WATER:    1. amiodarone (PACERONE) 200 MG tablet  2. ezetimibe (ZETIA) 10 MG tablet  3. metoprolol tartrate (LOPRESSOR) 25 MG tablet  4.  5.  6.  ____ Fleet Enema (as directed)   __x__ Use CHG Soap  as directed  __x__ Use inhalers on the day of surgeryalbuterol (PROVENTIL HFA;VENTOLIN HFA) 108 (90 Base) MCG/ACT inhaler and bring with you to hospital budesonide-formoterol (SYMBICORT) 160-4.5 MCG/ACT inhaler,tiotropium (SPIRIVA) 18 MCG inhalation capsule  ____ Stop metformin 2 days prior to surgery    ____ Take 1/2 of usual insulin dose the night before surgery. No insulin the morning          of surgery.   __x__ Stop  on apixaban (ELIQUIS) 5 MG TABS tablet Last dose on 4/14  ____ Stop Anti-inflammatories on    _x___ Stop supplements until after surgery . Coenzyme Q10 (COQ10) 200 MG CAPS,Krill Oil 350 MG CAPS  on 4/10  _x___ Bring C-Pap to the hospital.

## 2017-10-23 NOTE — Telephone Encounter (Signed)
Clearance routed to number provided via Epic.  

## 2017-10-23 NOTE — Telephone Encounter (Signed)
Acceptable risk for procedure no further testing needed Would hold Eliquis 2 days prior to procedure Restart Eliquis when acceptable from the surgeon's perspective

## 2017-10-27 ENCOUNTER — Encounter
Admission: RE | Admit: 2017-10-27 | Discharge: 2017-10-27 | Disposition: A | Discharge status: Home / Self Care | Payer: Medicare HMO | Source: Ambulatory Visit | Attending: Orthopedic Surgery | Admitting: Orthopedic Surgery

## 2017-10-27 DIAGNOSIS — Z01812 Encounter for preprocedural laboratory examination: Secondary | ICD-10-CM | POA: Diagnosis present

## 2017-10-27 LAB — DIFFERENTIAL
BAND NEUTROPHILS: 0 %
BASOS ABS: 0 10*3/uL (ref 0–0.1)
Basophils Relative: 0 %
Blasts: 0 %
EOS ABS: 0.2 10*3/uL (ref 0–0.7)
Eosinophils Relative: 1 %
LYMPHS PCT: 83 %
Lymphs Abs: 19.9 10*3/uL — ABNORMAL HIGH (ref 1.0–3.6)
Metamyelocytes Relative: 0 %
Monocytes Absolute: 1 10*3/uL (ref 0.2–1.0)
Monocytes Relative: 4 %
Myelocytes: 0 %
NEUTROS PCT: 12 %
Neutro Abs: 2.9 10*3/uL (ref 1.4–6.5)
Other: 0 %
PROMYELOCYTES RELATIVE: 0 %
nRBC: 0 /100 WBC

## 2017-10-27 LAB — COMPREHENSIVE METABOLIC PANEL
ALT: 23 U/L (ref 17–63)
AST: 24 U/L (ref 15–41)
Albumin: 4.2 g/dL (ref 3.5–5.0)
Alkaline Phosphatase: 76 U/L (ref 38–126)
Anion gap: 7 (ref 5–15)
BILIRUBIN TOTAL: 1.4 mg/dL — AB (ref 0.3–1.2)
BUN: 18 mg/dL (ref 6–20)
CALCIUM: 8.6 mg/dL — AB (ref 8.9–10.3)
CO2: 24 mmol/L (ref 22–32)
Chloride: 109 mmol/L (ref 101–111)
Creatinine, Ser: 0.93 mg/dL (ref 0.61–1.24)
GFR calc Af Amer: >60 mL/min (ref >60)
Glucose, Bld: 106 mg/dL — ABNORMAL HIGH (ref 65–99)
Potassium: 3.6 mmol/L (ref 3.5–5.1)
Sodium: 140 mmol/L (ref 135–145)
TOTAL PROTEIN: 7.2 g/dL (ref 6.5–8.1)

## 2017-10-27 LAB — CBC
HEMATOCRIT: 40.9 % (ref 40.0–52.0)
Hemoglobin: 13.5 g/dL (ref 13.0–18.0)
MCH: 32 pg (ref 26.0–34.0)
MCHC: 32.9 g/dL (ref 32.0–36.0)
MCV: 97.4 fL (ref 80.0–100.0)
Platelets: 134 10*3/uL — ABNORMAL LOW (ref 150–440)
RBC: 4.2 MIL/uL — AB (ref 4.40–5.90)
RDW: 15.1 % — ABNORMAL HIGH (ref 11.5–14.5)
WBC: 24 10*3/uL — AB (ref 3.8–10.6)

## 2017-10-27 LAB — TYPE AND SCREEN
ABO/RH(D): O NEG
Antibody Screen: NEGATIVE

## 2017-10-27 LAB — PROTIME-INR
INR: 1.34
PROTHROMBIN TIME: 16.5 s — AB (ref 11.4–15.2)

## 2017-10-27 LAB — SEDIMENTATION RATE: SED RATE: 5 mm/h (ref 0–20)

## 2017-10-27 LAB — APTT: aPTT: 32 seconds (ref 24–36)

## 2017-10-27 LAB — C-REACTIVE PROTEIN: CRP: <0.8 mg/dL (ref <1.0)

## 2017-10-27 NOTE — Pre-Procedure Instructions (Signed)
Labs faxed to dr Marry Guan

## 2017-11-03 MED ORDER — CEFAZOLIN SODIUM-DEXTROSE 2-4 GM/100ML-% IV SOLN
2.0000 g | INTRAVENOUS | Status: AC
  Administered 2017-11-04: 2 g via INTRAVENOUS
  Filled 2017-11-03: qty 100

## 2017-11-03 MED ORDER — TRANEXAMIC ACID 1000 MG/10ML IV SOLN
1000.0000 mg | INTRAVENOUS | Status: AC
  Administered 2017-11-04: 1000 mg via INTRAVENOUS
  Filled 2017-11-03: qty 10

## 2017-11-04 ENCOUNTER — Encounter: Payer: Self-pay | Admitting: Orthopedic Surgery

## 2017-11-04 ENCOUNTER — Inpatient Hospital Stay
Admission: RE | Admit: 2017-11-04 | Discharge: 2017-11-06 | DRG: 470 | Disposition: A | Discharge status: Home Health | Payer: Medicare HMO | Source: Ambulatory Visit | Attending: Orthopedic Surgery | Admitting: Orthopedic Surgery

## 2017-11-04 ENCOUNTER — Inpatient Hospital Stay: Payer: Medicare HMO | Admitting: Anesthesiology

## 2017-11-04 ENCOUNTER — Other Ambulatory Visit: Payer: Self-pay

## 2017-11-04 ENCOUNTER — Encounter
Admission: RE | Disposition: A | Discharge status: Home Health | Payer: Self-pay | Source: Ambulatory Visit | Attending: Orthopedic Surgery

## 2017-11-04 ENCOUNTER — Inpatient Hospital Stay: Payer: Medicare HMO

## 2017-11-04 DIAGNOSIS — M1612 Unilateral primary osteoarthritis, left hip: Principal | ICD-10-CM | POA: Diagnosis present

## 2017-11-04 DIAGNOSIS — I700 Atherosclerosis of aorta: Secondary | ICD-10-CM | POA: Diagnosis present

## 2017-11-04 DIAGNOSIS — E7800 Pure hypercholesterolemia, unspecified: Secondary | ICD-10-CM | POA: Diagnosis present

## 2017-11-04 DIAGNOSIS — Z950 Presence of cardiac pacemaker: Secondary | ICD-10-CM | POA: Diagnosis not present

## 2017-11-04 DIAGNOSIS — Z7901 Long term (current) use of anticoagulants: Secondary | ICD-10-CM | POA: Diagnosis not present

## 2017-11-04 DIAGNOSIS — I2510 Atherosclerotic heart disease of native coronary artery without angina pectoris: Secondary | ICD-10-CM | POA: Diagnosis present

## 2017-11-04 DIAGNOSIS — I10 Essential (primary) hypertension: Secondary | ICD-10-CM | POA: Diagnosis present

## 2017-11-04 DIAGNOSIS — Z951 Presence of aortocoronary bypass graft: Secondary | ICD-10-CM

## 2017-11-04 DIAGNOSIS — J449 Chronic obstructive pulmonary disease, unspecified: Secondary | ICD-10-CM | POA: Diagnosis present

## 2017-11-04 DIAGNOSIS — R69 Illness, unspecified: Secondary | ICD-10-CM | POA: Diagnosis not present

## 2017-11-04 DIAGNOSIS — I4891 Unspecified atrial fibrillation: Secondary | ICD-10-CM | POA: Diagnosis present

## 2017-11-04 DIAGNOSIS — I509 Heart failure, unspecified: Secondary | ICD-10-CM | POA: Diagnosis not present

## 2017-11-04 DIAGNOSIS — Z9189 Other specified personal risk factors, not elsewhere classified: Secondary | ICD-10-CM

## 2017-11-04 DIAGNOSIS — C9112 Chronic lymphocytic leukemia of B-cell type in relapse: Secondary | ICD-10-CM | POA: Diagnosis not present

## 2017-11-04 DIAGNOSIS — Z7951 Long term (current) use of inhaled steroids: Secondary | ICD-10-CM | POA: Diagnosis not present

## 2017-11-04 DIAGNOSIS — H9190 Unspecified hearing loss, unspecified ear: Secondary | ICD-10-CM | POA: Diagnosis present

## 2017-11-04 DIAGNOSIS — I252 Old myocardial infarction: Secondary | ICD-10-CM

## 2017-11-04 DIAGNOSIS — Z9221 Personal history of antineoplastic chemotherapy: Secondary | ICD-10-CM

## 2017-11-04 DIAGNOSIS — Z96649 Presence of unspecified artificial hip joint: Secondary | ICD-10-CM

## 2017-11-04 DIAGNOSIS — I110 Hypertensive heart disease with heart failure: Secondary | ICD-10-CM | POA: Diagnosis not present

## 2017-11-04 DIAGNOSIS — F4310 Post-traumatic stress disorder, unspecified: Secondary | ICD-10-CM | POA: Diagnosis present

## 2017-11-04 DIAGNOSIS — Z8619 Personal history of other infectious and parasitic diseases: Secondary | ICD-10-CM

## 2017-11-04 DIAGNOSIS — Z96642 Presence of left artificial hip joint: Secondary | ICD-10-CM | POA: Diagnosis not present

## 2017-11-04 DIAGNOSIS — Z8673 Personal history of transient ischemic attack (TIA), and cerebral infarction without residual deficits: Secondary | ICD-10-CM | POA: Diagnosis not present

## 2017-11-04 DIAGNOSIS — C9110 Chronic lymphocytic leukemia of B-cell type not having achieved remission: Secondary | ICD-10-CM | POA: Diagnosis present

## 2017-11-04 DIAGNOSIS — G4733 Obstructive sleep apnea (adult) (pediatric): Secondary | ICD-10-CM | POA: Diagnosis present

## 2017-11-04 DIAGNOSIS — Z96641 Presence of right artificial hip joint: Secondary | ICD-10-CM | POA: Diagnosis present

## 2017-11-04 DIAGNOSIS — Z79899 Other long term (current) drug therapy: Secondary | ICD-10-CM

## 2017-11-04 DIAGNOSIS — Z9989 Dependence on other enabling machines and devices: Secondary | ICD-10-CM

## 2017-11-04 DIAGNOSIS — Z87891 Personal history of nicotine dependence: Secondary | ICD-10-CM

## 2017-11-04 DIAGNOSIS — I739 Peripheral vascular disease, unspecified: Secondary | ICD-10-CM | POA: Diagnosis present

## 2017-11-04 DIAGNOSIS — Z471 Aftercare following joint replacement surgery: Secondary | ICD-10-CM | POA: Diagnosis not present

## 2017-11-04 HISTORY — DX: Gastro-esophageal reflux disease without esophagitis: K21.9

## 2017-11-04 HISTORY — DX: Cardiac arrhythmia, unspecified: I49.9

## 2017-11-04 HISTORY — PX: TOTAL HIP ARTHROPLASTY: SHX124

## 2017-11-04 LAB — PROTIME-INR
INR: 1.11
PROTHROMBIN TIME: 14.2 s (ref 11.4–15.2)

## 2017-11-04 IMAGING — DX DG HIP (WITH OR WITHOUT PELVIS) 1V PORT*L*
3 series · 4 of 4 positions shown · non-contrast
Comparison: Pelvis film of [DATE]

CLINICAL DATA: Post left total hip replacement

EXAM:
DG HIP (WITH OR WITHOUT PELVIS) 1V PORT LEFT

[pelvis ap (1 of 2)]
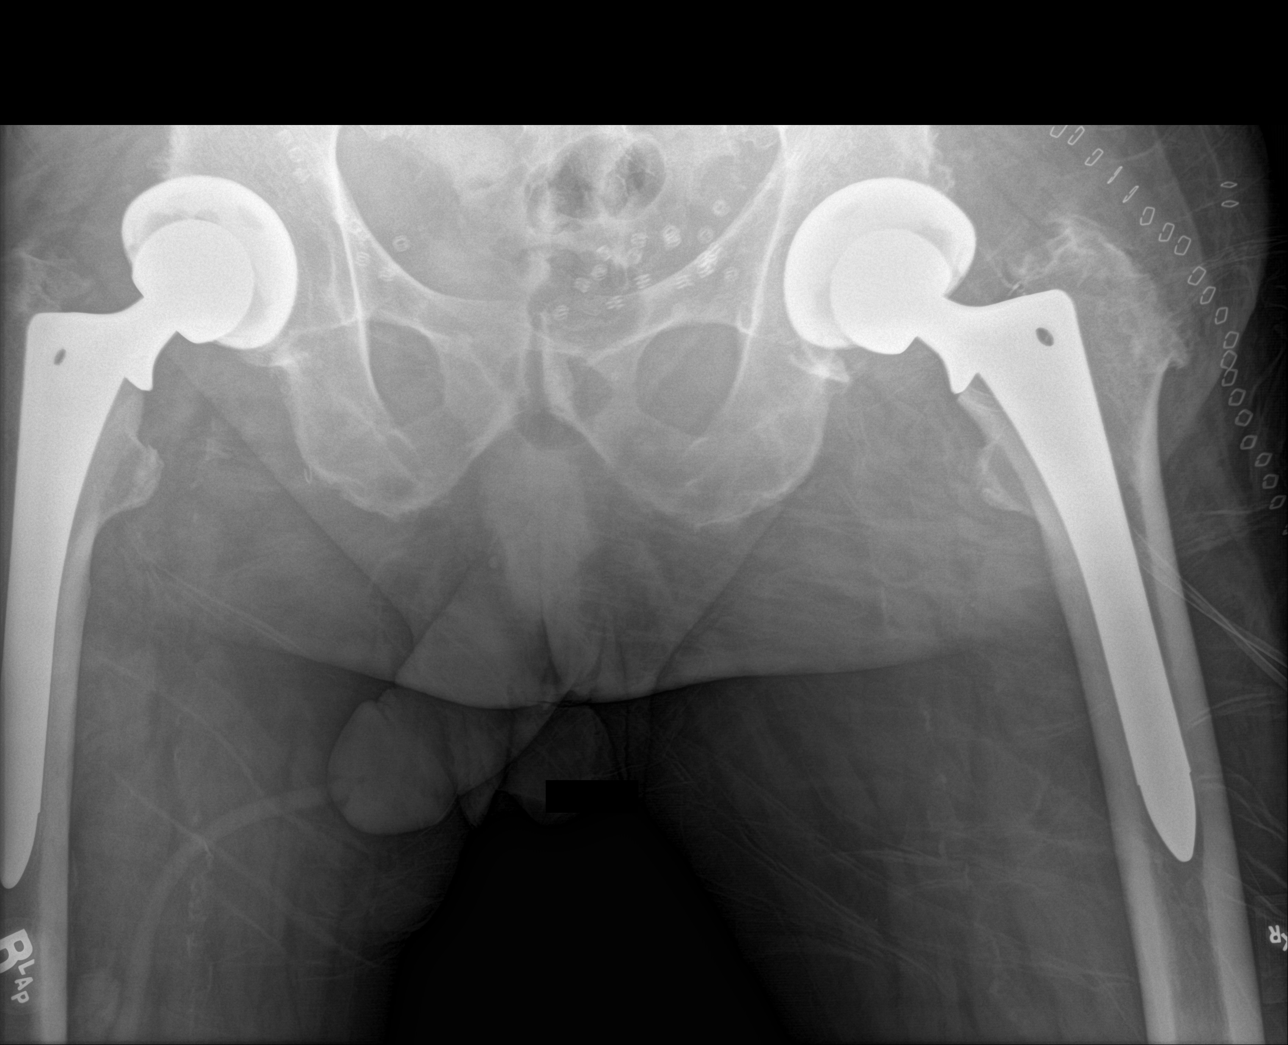

[Series 3: hip lat · 0.14mm/px · 2 of 2 slices shown]
[im 1/2]
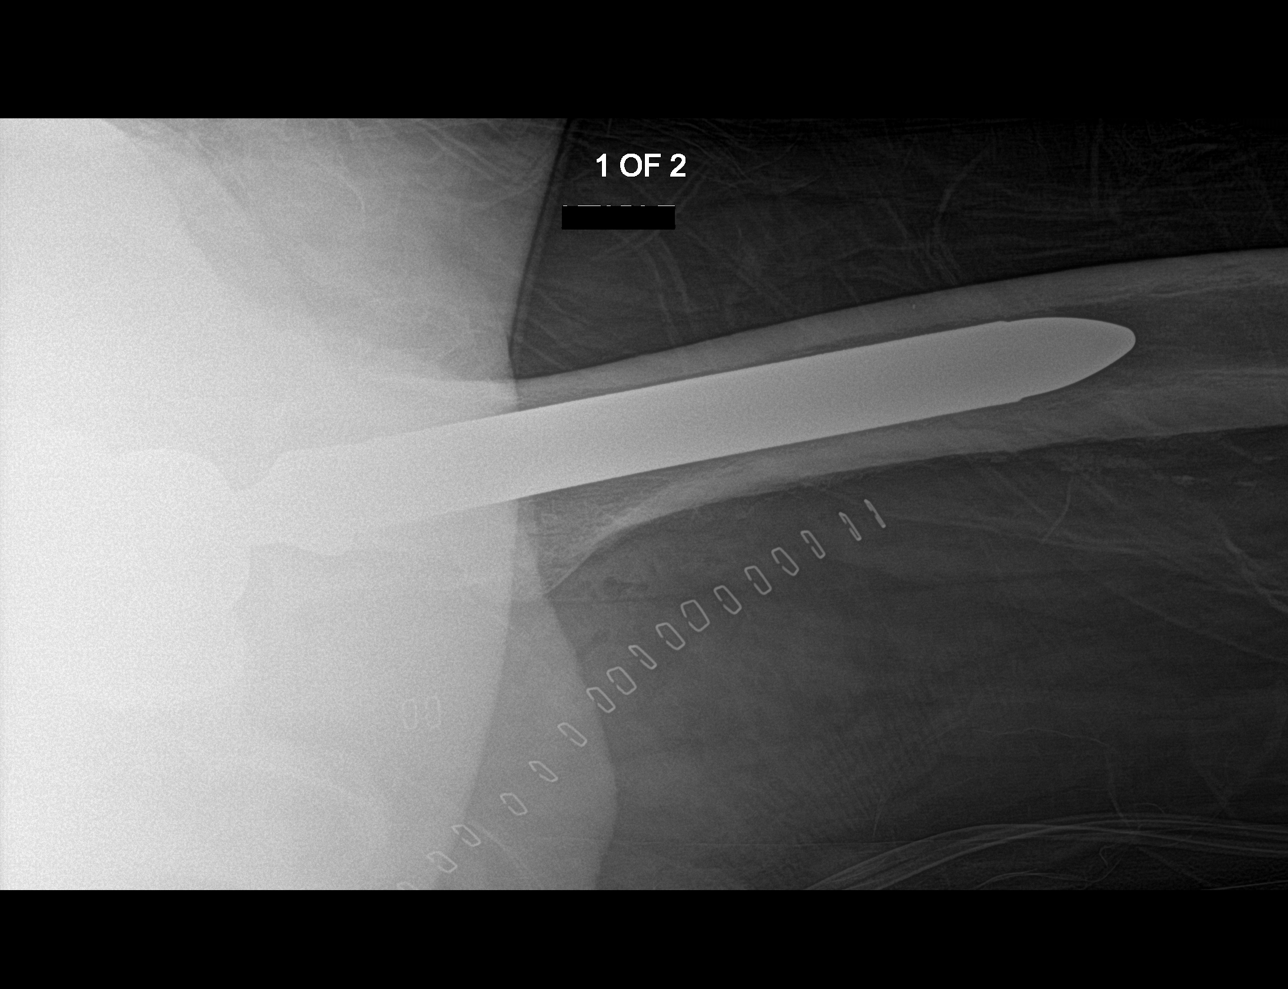
[im 2/2]
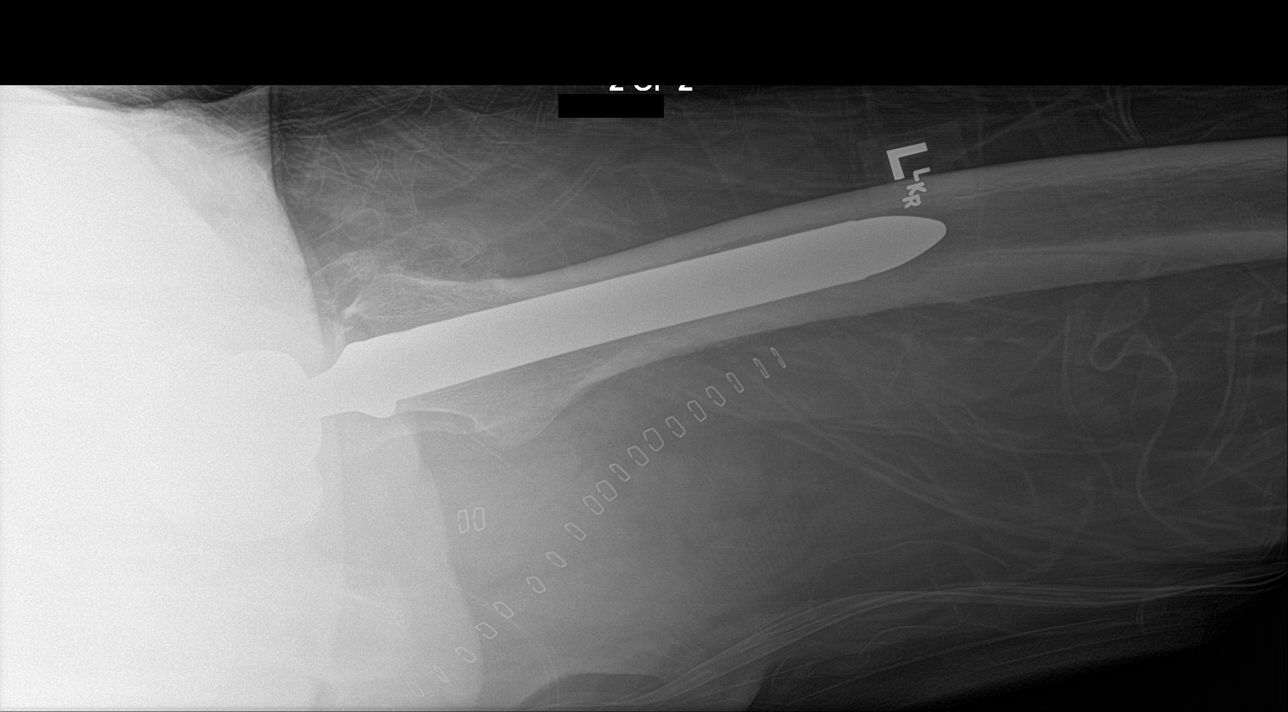

[pelvis ap (2 of 2)]
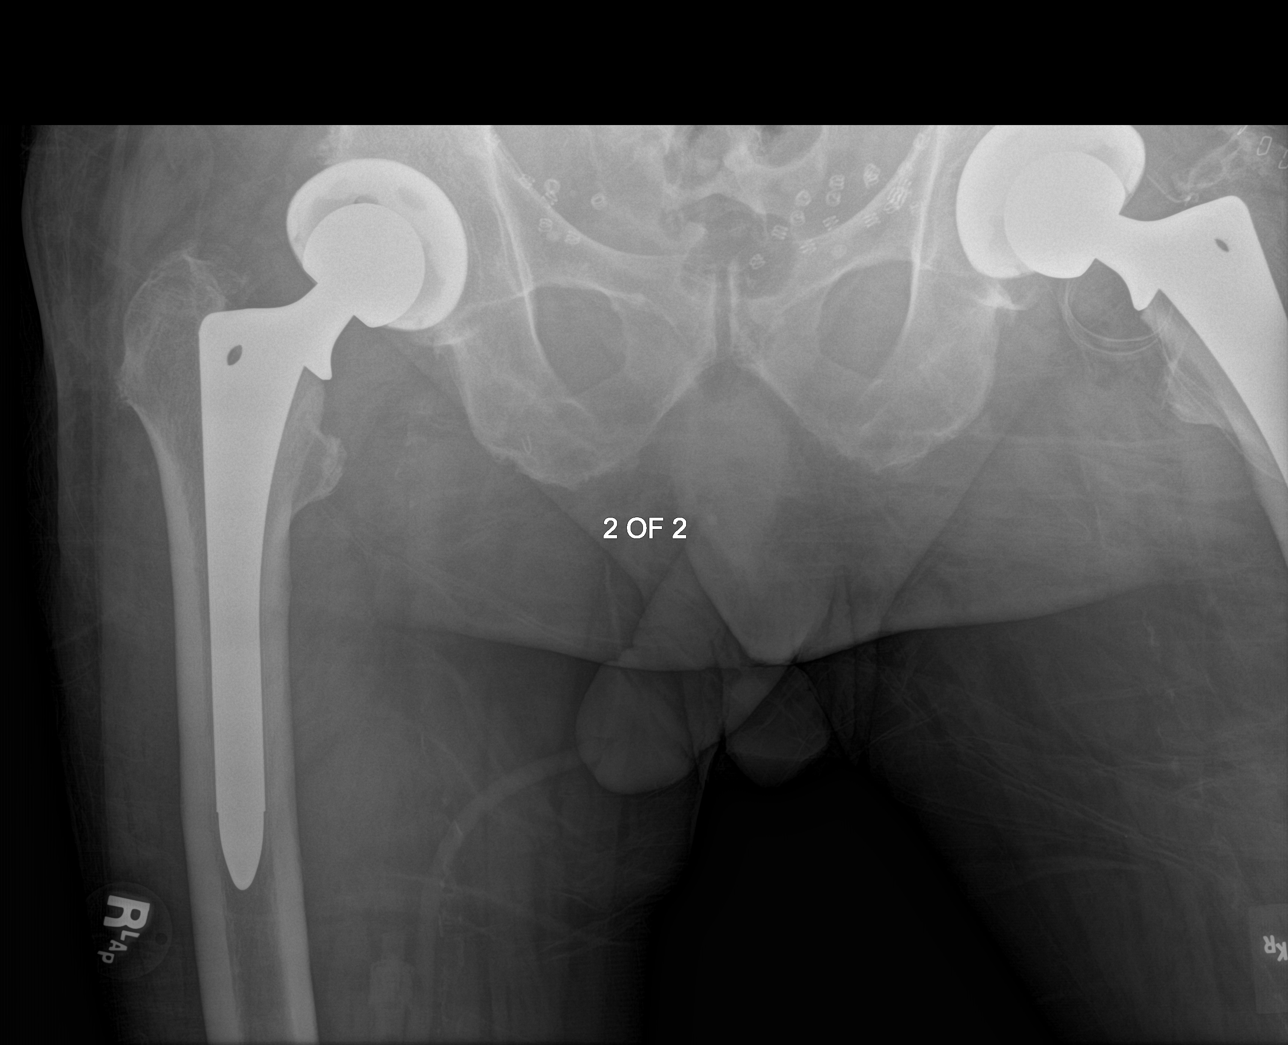

[4 of 4 positions shown; findings below may reference images not displayed]

FINDINGS: The acetabular and femoral components of the left total hip
replacement are in good position. No complicating features are seen.
The right total hip replacement is unchanged. The pelvic rami are
intact.
IMPRESSION: New left total hip replacement components in good position. No
complicating features.

## 2017-11-04 SURGERY — ARTHROPLASTY, HIP, TOTAL,POSTERIOR APPROACH
Anesthesia: Spinal | Laterality: Left

## 2017-11-04 MED ORDER — TRAMADOL HCL 50 MG PO TABS: 50.0000 mg | ORAL_TABLET | ORAL | Status: DC | PRN

## 2017-11-04 MED ORDER — BISACODYL 10 MG RE SUPP: 10.0000 mg | Freq: Every day | RECTAL | Status: DC | PRN

## 2017-11-04 MED ORDER — FAMOTIDINE 20 MG PO TABS
ORAL_TABLET | ORAL | Status: AC
  Filled 2017-11-04: qty 1

## 2017-11-04 MED ORDER — ADULT MULTIVITAMIN W/MINERALS CH
1.0000 | ORAL_TABLET | Freq: Every day | ORAL | Status: DC
  Administered 2017-11-05 – 2017-11-06 (×2): 1 via ORAL
  Filled 2017-11-04 (×2): qty 1

## 2017-11-04 MED ORDER — MENTHOL 3 MG MT LOZG
1.0000 | LOZENGE | OROMUCOSAL | Status: DC | PRN
  Filled 2017-11-04: qty 9

## 2017-11-04 MED ORDER — TRANEXAMIC ACID 1000 MG/10ML IV SOLN
1000.0000 mg | Freq: Once | INTRAVENOUS | Status: AC
  Administered 2017-11-04: 1000 mg via INTRAVENOUS
  Filled 2017-11-04: qty 10

## 2017-11-04 MED ORDER — HYDROMORPHONE HCL 1 MG/ML IJ SOLN: 0.5000 mg | INTRAMUSCULAR | Status: DC | PRN

## 2017-11-04 MED ORDER — DEXAMETHASONE SODIUM PHOSPHATE 10 MG/ML IJ SOLN
INTRAMUSCULAR | Status: AC
  Administered 2017-11-04: 8 mg via INTRAVENOUS
  Filled 2017-11-04: qty 1

## 2017-11-04 MED ORDER — METOPROLOL TARTRATE 25 MG PO TABS
25.0000 mg | ORAL_TABLET | Freq: Two times a day (BID) | ORAL | Status: DC
  Administered 2017-11-04 – 2017-11-06 (×4): 25 mg via ORAL
  Filled 2017-11-04 (×6): qty 1

## 2017-11-04 MED ORDER — PROPOFOL 10 MG/ML IV BOLUS
INTRAVENOUS | Status: DC | PRN
  Administered 2017-11-04 (×2): 20 mg via INTRAVENOUS

## 2017-11-04 MED ORDER — NEOMYCIN-POLYMYXIN B GU 40-200000 IR SOLN
Status: DC | PRN
  Administered 2017-11-04: 14 mL

## 2017-11-04 MED ORDER — ONDANSETRON HCL 4 MG/2ML IJ SOLN: 4.0000 mg | Freq: Four times a day (QID) | INTRAMUSCULAR | Status: DC | PRN

## 2017-11-04 MED ORDER — FENTANYL CITRATE (PF) 100 MCG/2ML IJ SOLN
INTRAMUSCULAR | Status: DC | PRN
  Administered 2017-11-04 (×2): 50 ug via INTRAVENOUS

## 2017-11-04 MED ORDER — DIPHENHYDRAMINE HCL 12.5 MG/5ML PO ELIX: 12.5000 mg | ORAL_SOLUTION | ORAL | Status: DC | PRN

## 2017-11-04 MED ORDER — GLYCOPYRROLATE 0.2 MG/ML IJ SOLN
INTRAMUSCULAR | Status: AC
  Filled 2017-11-04: qty 1

## 2017-11-04 MED ORDER — LORATADINE 10 MG PO TABS
10.0000 mg | ORAL_TABLET | Freq: Every day | ORAL | Status: DC
  Administered 2017-11-05 – 2017-11-06 (×2): 10 mg via ORAL
  Filled 2017-11-04 (×2): qty 1

## 2017-11-04 MED ORDER — PHENOL 1.4 % MT LIQD
1.0000 | OROMUCOSAL | Status: DC | PRN
  Filled 2017-11-04: qty 177

## 2017-11-04 MED ORDER — PANTOPRAZOLE SODIUM 40 MG PO TBEC
40.0000 mg | DELAYED_RELEASE_TABLET | Freq: Two times a day (BID) | ORAL | Status: DC
  Administered 2017-11-04 – 2017-11-06 (×4): 40 mg via ORAL
  Filled 2017-11-04 (×5): qty 1

## 2017-11-04 MED ORDER — MIDAZOLAM HCL 2 MG/2ML IJ SOLN
INTRAMUSCULAR | Status: AC
  Filled 2017-11-04: qty 2

## 2017-11-04 MED ORDER — CHLORHEXIDINE GLUCONATE 4 % EX LIQD: 60.0000 mL | Freq: Once | CUTANEOUS | Status: DC

## 2017-11-04 MED ORDER — SODIUM CHLORIDE 0.9 % IV SOLN
INTRAVENOUS | Status: DC | PRN
  Administered 2017-11-04: 20 ug/min via INTRAVENOUS

## 2017-11-04 MED ORDER — ATORVASTATIN CALCIUM 20 MG PO TABS
80.0000 mg | ORAL_TABLET | Freq: Every day | ORAL | Status: DC
  Administered 2017-11-04 – 2017-11-05 (×2): 80 mg via ORAL
  Filled 2017-11-04 (×2): qty 4

## 2017-11-04 MED ORDER — ALBUTEROL SULFATE (2.5 MG/3ML) 0.083% IN NEBU: 2.5000 mg | INHALATION_SOLUTION | Freq: Four times a day (QID) | RESPIRATORY_TRACT | Status: DC | PRN

## 2017-11-04 MED ORDER — OXYCODONE HCL 5 MG PO TABS
10.0000 mg | ORAL_TABLET | ORAL | Status: DC | PRN
  Administered 2017-11-05 – 2017-11-06 (×2): 10 mg via ORAL
  Filled 2017-11-04 (×3): qty 2

## 2017-11-04 MED ORDER — ONDANSETRON HCL 4 MG/2ML IJ SOLN: 4.0000 mg | Freq: Once | INTRAMUSCULAR | Status: DC | PRN

## 2017-11-04 MED ORDER — MIDAZOLAM HCL 5 MG/5ML IJ SOLN
INTRAMUSCULAR | Status: DC | PRN
  Administered 2017-11-04: 1 mg via INTRAVENOUS

## 2017-11-04 MED ORDER — ONDANSETRON HCL 4 MG PO TABS
4.0000 mg | ORAL_TABLET | Freq: Four times a day (QID) | ORAL | Status: DC | PRN
Start: 2017-11-04 — End: 2017-11-06

## 2017-11-04 MED ORDER — SODIUM CHLORIDE 0.9 % IV SOLN
INTRAVENOUS | Status: DC
  Administered 2017-11-04 – 2017-11-05 (×2): via INTRAVENOUS

## 2017-11-04 MED ORDER — FENTANYL CITRATE (PF) 100 MCG/2ML IJ SOLN: 25.0000 ug | INTRAMUSCULAR | Status: DC | PRN

## 2017-11-04 MED ORDER — APIXABAN 5 MG PO TABS
5.0000 mg | ORAL_TABLET | Freq: Two times a day (BID) | ORAL | Status: DC
  Administered 2017-11-05 (×2): 5 mg via ORAL
  Filled 2017-11-04 (×3): qty 1

## 2017-11-04 MED ORDER — CEFAZOLIN SODIUM-DEXTROSE 2-4 GM/100ML-% IV SOLN
2.0000 g | Freq: Four times a day (QID) | INTRAVENOUS | Status: AC
  Administered 2017-11-04 – 2017-11-05 (×4): 2 g via INTRAVENOUS
  Filled 2017-11-04 (×5): qty 100

## 2017-11-04 MED ORDER — BUPIVACAINE HCL (PF) 0.5 % IJ SOLN
INTRAMUSCULAR | Status: DC | PRN
  Administered 2017-11-04: 3 mL

## 2017-11-04 MED ORDER — FENTANYL CITRATE (PF) 100 MCG/2ML IJ SOLN
INTRAMUSCULAR | Status: AC
  Filled 2017-11-04: qty 2

## 2017-11-04 MED ORDER — SENNOSIDES-DOCUSATE SODIUM 8.6-50 MG PO TABS
1.0000 | ORAL_TABLET | Freq: Two times a day (BID) | ORAL | Status: DC
  Administered 2017-11-04 – 2017-11-06 (×4): 1 via ORAL
  Filled 2017-11-04 (×3): qty 1

## 2017-11-04 MED ORDER — MAGNESIUM HYDROXIDE 400 MG/5ML PO SUSP: 30.0000 mL | Freq: Every day | ORAL | Status: DC | PRN

## 2017-11-04 MED ORDER — GABAPENTIN 300 MG PO CAPS
300.0000 mg | ORAL_CAPSULE | Freq: Every day | ORAL | Status: DC
  Administered 2017-11-04 – 2017-11-05 (×2): 300 mg via ORAL
  Filled 2017-11-04 (×2): qty 1

## 2017-11-04 MED ORDER — GABAPENTIN 300 MG PO CAPS
ORAL_CAPSULE | ORAL | Status: AC
  Filled 2017-11-04: qty 1

## 2017-11-04 MED ORDER — NITROGLYCERIN 0.4 MG SL SUBL: 0.4000 mg | SUBLINGUAL_TABLET | SUBLINGUAL | Status: DC | PRN

## 2017-11-04 MED ORDER — MIRTAZAPINE 15 MG PO TABS
15.0000 mg | ORAL_TABLET | Freq: Every evening | ORAL | Status: DC | PRN
  Administered 2017-11-04: 15 mg via ORAL
  Filled 2017-11-04 (×2): qty 1

## 2017-11-04 MED ORDER — OXYCODONE HCL 5 MG PO TABS
5.0000 mg | ORAL_TABLET | ORAL | Status: DC | PRN
  Administered 2017-11-04 – 2017-11-05 (×4): 5 mg via ORAL
  Filled 2017-11-04 (×3): qty 1

## 2017-11-04 MED ORDER — FERROUS SULFATE 325 (65 FE) MG PO TABS
325.0000 mg | ORAL_TABLET | Freq: Two times a day (BID) | ORAL | Status: DC
  Administered 2017-11-04 – 2017-11-06 (×4): 325 mg via ORAL
  Filled 2017-11-04 (×3): qty 1

## 2017-11-04 MED ORDER — DEXAMETHASONE SODIUM PHOSPHATE 10 MG/ML IJ SOLN
8.0000 mg | Freq: Once | INTRAMUSCULAR | Status: AC
  Administered 2017-11-04: 8 mg via INTRAVENOUS

## 2017-11-04 MED ORDER — FLEET ENEMA 7-19 GM/118ML RE ENEM: 1.0000 | ENEMA | Freq: Once | RECTAL | Status: DC | PRN

## 2017-11-04 MED ORDER — GABAPENTIN 300 MG PO CAPS
300.0000 mg | ORAL_CAPSULE | Freq: Once | ORAL | Status: AC
  Administered 2017-11-04: 300 mg via ORAL

## 2017-11-04 MED ORDER — ACETAMINOPHEN 10 MG/ML IV SOLN
INTRAVENOUS | Status: DC | PRN
  Administered 2017-11-04: 1000 mg via INTRAVENOUS

## 2017-11-04 MED ORDER — PROPOFOL 500 MG/50ML IV EMUL
INTRAVENOUS | Status: DC | PRN
  Administered 2017-11-04: 65 ug/kg/min via INTRAVENOUS

## 2017-11-04 MED ORDER — CELECOXIB 200 MG PO CAPS
200.0000 mg | ORAL_CAPSULE | Freq: Two times a day (BID) | ORAL | Status: DC
  Administered 2017-11-04 – 2017-11-06 (×4): 200 mg via ORAL
  Filled 2017-11-04 (×4): qty 1

## 2017-11-04 MED ORDER — CEFAZOLIN SODIUM-DEXTROSE 2-3 GM-%(50ML) IV SOLR
INTRAVENOUS | Status: AC
  Filled 2017-11-04: qty 50

## 2017-11-04 MED ORDER — PROPOFOL 500 MG/50ML IV EMUL
INTRAVENOUS | Status: AC
  Filled 2017-11-04: qty 50

## 2017-11-04 MED ORDER — BUPIVACAINE HCL (PF) 0.5 % IJ SOLN
INTRAMUSCULAR | Status: AC
  Filled 2017-11-04: qty 10

## 2017-11-04 MED ORDER — ACETAMINOPHEN 10 MG/ML IV SOLN
INTRAVENOUS | Status: AC
  Filled 2017-11-04: qty 100

## 2017-11-04 MED ORDER — KRILL OIL 350 MG PO CAPS
350.0000 mg | ORAL_CAPSULE | Freq: Every evening | ORAL | Status: DC
  Administered 2017-11-04: 350 mg via ORAL

## 2017-11-04 MED ORDER — AMIODARONE HCL 200 MG PO TABS
100.0000 mg | ORAL_TABLET | Freq: Two times a day (BID) | ORAL | Status: DC
  Administered 2017-11-04 – 2017-11-06 (×4): 100 mg via ORAL
  Filled 2017-11-04 (×4): qty 1

## 2017-11-04 MED ORDER — FAMOTIDINE 20 MG PO TABS
20.0000 mg | ORAL_TABLET | Freq: Once | ORAL | Status: AC
  Administered 2017-11-04: 20 mg via ORAL

## 2017-11-04 MED ORDER — ACETAMINOPHEN 10 MG/ML IV SOLN
1000.0000 mg | Freq: Four times a day (QID) | INTRAVENOUS | Status: AC
Start: 2017-11-04 — End: 2017-11-05
  Administered 2017-11-04 – 2017-11-05 (×3): 1000 mg via INTRAVENOUS
  Filled 2017-11-04 (×4): qty 100

## 2017-11-04 MED ORDER — ACETAMINOPHEN 325 MG PO TABS
325.0000 mg | ORAL_TABLET | Freq: Four times a day (QID) | ORAL | Status: DC | PRN
Start: 2017-11-05 — End: 2017-11-06

## 2017-11-04 MED ORDER — METOCLOPRAMIDE HCL 5 MG/ML IJ SOLN: 5.0000 mg | Freq: Three times a day (TID) | INTRAMUSCULAR | Status: DC | PRN

## 2017-11-04 MED ORDER — MOMETASONE FURO-FORMOTEROL FUM 200-5 MCG/ACT IN AERO
2.0000 | INHALATION_SPRAY | Freq: Two times a day (BID) | RESPIRATORY_TRACT | Status: DC
Start: 2017-11-04 — End: 2017-11-06
  Administered 2017-11-05 (×2): 2 via RESPIRATORY_TRACT
  Filled 2017-11-04: qty 8.8

## 2017-11-04 MED ORDER — CELECOXIB 200 MG PO CAPS
400.0000 mg | ORAL_CAPSULE | Freq: Once | ORAL | Status: AC
  Administered 2017-11-04: 400 mg via ORAL

## 2017-11-04 MED ORDER — METOCLOPRAMIDE HCL 10 MG PO TABS: 5.0000 mg | ORAL_TABLET | Freq: Three times a day (TID) | ORAL | Status: DC | PRN

## 2017-11-04 MED ORDER — LIDOCAINE HCL (PF) 2 % IJ SOLN
INTRAMUSCULAR | Status: AC
  Filled 2017-11-04: qty 10

## 2017-11-04 MED ORDER — CELECOXIB 200 MG PO CAPS
ORAL_CAPSULE | ORAL | Status: AC
  Filled 2017-11-04: qty 2

## 2017-11-04 MED ORDER — LACTATED RINGERS IV SOLN
INTRAVENOUS | Status: DC
  Administered 2017-11-04: 50 mL/h via INTRAVENOUS
  Administered 2017-11-04: 10:00:00 via INTRAVENOUS

## 2017-11-04 MED ORDER — ALUM & MAG HYDROXIDE-SIMETH 200-200-20 MG/5ML PO SUSP: 30.0000 mL | ORAL | Status: DC | PRN

## 2017-11-04 MED ORDER — EZETIMIBE 10 MG PO TABS
10.0000 mg | ORAL_TABLET | Freq: Every day | ORAL | Status: DC
  Administered 2017-11-05 – 2017-11-06 (×2): 10 mg via ORAL
  Filled 2017-11-04 (×2): qty 1

## 2017-11-04 MED ORDER — METOCLOPRAMIDE HCL 10 MG PO TABS
10.0000 mg | ORAL_TABLET | Freq: Three times a day (TID) | ORAL | Status: AC
  Administered 2017-11-04 – 2017-11-06 (×5): 10 mg via ORAL
  Filled 2017-11-04 (×6): qty 1

## 2017-11-04 MED ORDER — ALBUTEROL SULFATE HFA 108 (90 BASE) MCG/ACT IN AERS: 2.0000 | INHALATION_SPRAY | Freq: Four times a day (QID) | RESPIRATORY_TRACT | Status: DC | PRN

## 2017-11-04 MED ORDER — PHENYLEPHRINE HCL 10 MG/ML IJ SOLN
INTRAMUSCULAR | Status: AC
  Filled 2017-11-04: qty 1

## 2017-11-04 MED ORDER — PROPOFOL 10 MG/ML IV BOLUS
INTRAVENOUS | Status: AC
  Filled 2017-11-04: qty 20

## 2017-11-04 MED ORDER — TIOTROPIUM BROMIDE MONOHYDRATE 18 MCG IN CAPS
18.0000 ug | ORAL_CAPSULE | Freq: Every day | RESPIRATORY_TRACT | Status: DC
  Filled 2017-11-04: qty 5

## 2017-11-04 SURGICAL SUPPLY — 53 items
BLADE DRUM FLTD (BLADE) ×2 IMPLANT
BLADE SAW 1 (BLADE) ×2 IMPLANT
CANISTER SUCT 1200ML W/VALVE (MISCELLANEOUS) ×2 IMPLANT
CANISTER SUCT 3000ML PPV (MISCELLANEOUS) ×4 IMPLANT
CAPT HIP TOTAL 2 ×2 IMPLANT
CARTRIDGE OIL MAESTRO DRILL (MISCELLANEOUS) ×1 IMPLANT
DIFFUSER DRILL AIR PNEUMATIC (MISCELLANEOUS) ×2 IMPLANT
DRAPE INCISE IOBAN 66X60 STRL (DRAPES) ×2 IMPLANT
DRAPE SHEET LG 3/4 BI-LAMINATE (DRAPES) ×2 IMPLANT
DRSG DERMACEA 8X12 NADH (GAUZE/BANDAGES/DRESSINGS) ×2 IMPLANT
DRSG OPSITE POSTOP 4X12 (GAUZE/BANDAGES/DRESSINGS) ×2 IMPLANT
DRSG OPSITE POSTOP 4X14 (GAUZE/BANDAGES/DRESSINGS) ×2 IMPLANT
DRSG TEGADERM 4X4.75 (GAUZE/BANDAGES/DRESSINGS) ×2 IMPLANT
DURAPREP 26ML APPLICATOR (WOUND CARE) ×2 IMPLANT
ELECT BLADE 6.5 EXT (BLADE) ×2 IMPLANT
ELECT CAUTERY BLADE 6.4 (BLADE) ×2 IMPLANT
GLOVE BIOGEL M STRL SZ7.5 (GLOVE) ×4 IMPLANT
GLOVE BIOGEL PI IND STRL 9 (GLOVE) ×1 IMPLANT
GLOVE BIOGEL PI INDICATOR 9 (GLOVE) ×1
GLOVE INDICATOR 8.0 STRL GRN (GLOVE) ×2 IMPLANT
GLOVE SURG SYN 9.0  PF PI (GLOVE) ×1
GLOVE SURG SYN 9.0 PF PI (GLOVE) ×1 IMPLANT
GOWN STRL REUS W/ TWL LRG LVL3 (GOWN DISPOSABLE) ×2 IMPLANT
GOWN STRL REUS W/TWL 2XL LVL3 (GOWN DISPOSABLE) ×2 IMPLANT
GOWN STRL REUS W/TWL LRG LVL3 (GOWN DISPOSABLE) ×2
HEMOVAC 400CC 10FR (MISCELLANEOUS) ×2 IMPLANT
HOLDER FOLEY CATH W/STRAP (MISCELLANEOUS) ×2 IMPLANT
HOOD PEEL AWAY FLYTE STAYCOOL (MISCELLANEOUS) ×4 IMPLANT
KIT TURNOVER KIT A (KITS) ×2 IMPLANT
NDL SAFETY ECLIPSE 18X1.5 (NEEDLE) ×1 IMPLANT
NEEDLE HYPO 18GX1.5 SHARP (NEEDLE) ×1
NS IRRIG 500ML POUR BTL (IV SOLUTION) ×2 IMPLANT
OIL CARTRIDGE MAESTRO DRILL (MISCELLANEOUS) ×2
PACK HIP PROSTHESIS (MISCELLANEOUS) ×2 IMPLANT
PIN STEIN THRED 5/32 (Pin) ×2 IMPLANT
PULSAVAC PLUS IRRIG FAN TIP (DISPOSABLE) ×2
SOL .9 NS 3000ML IRR  AL (IV SOLUTION) ×1
SOL .9 NS 3000ML IRR UROMATIC (IV SOLUTION) ×1 IMPLANT
SOL PREP PVP 2OZ (MISCELLANEOUS) ×2
SOLUTION PREP PVP 2OZ (MISCELLANEOUS) ×1 IMPLANT
SPONGE DRAIN TRACH 4X4 STRL 2S (GAUZE/BANDAGES/DRESSINGS) ×2 IMPLANT
STAPLER SKIN PROX 35W (STAPLE) ×2 IMPLANT
SUT ETHIBOND #5 BRAIDED 30INL (SUTURE) ×2 IMPLANT
SUT VIC AB 0 CT1 36 (SUTURE) ×2 IMPLANT
SUT VIC AB 1 CT1 36 (SUTURE) ×4 IMPLANT
SUT VIC AB 2-0 CT1 27 (SUTURE) ×1
SUT VIC AB 2-0 CT1 TAPERPNT 27 (SUTURE) ×1 IMPLANT
SYR 20CC LL (SYRINGE) ×2 IMPLANT
TAPE ADH 3 LX (MISCELLANEOUS) ×2 IMPLANT
TAPE TRANSPORE STRL 2 31045 (GAUZE/BANDAGES/DRESSINGS) ×2 IMPLANT
TIP FAN IRRIG PULSAVAC PLUS (DISPOSABLE) ×1 IMPLANT
TOWEL OR 17X26 4PK STRL BLUE (TOWEL DISPOSABLE) ×2 IMPLANT
TRAY FOLEY W/METER SILVER 16FR (SET/KITS/TRAYS/PACK) ×2 IMPLANT

## 2017-11-04 NOTE — Transfer of Care (Signed)
Immediate Anesthesia Transfer of Care Note  Patient: [name]  Procedure(s) Performed: TOTAL HIP ARTHROPLASTY (Left )  Patient Location: PACU  Anesthesia Type:Spinal  Level of Consciousness: sedated  Airway & Oxygen Therapy: Patient Spontanous Breathing and Patient connected to nasal cannula oxygen  Post-op Assessment: Report given to RN and Post -op Vital signs reviewed and stable  Post vital signs: Reviewed and stable  Last Vitals:  Vitals Value Taken Time  BP    Temp    Pulse 62 11/04/2017 10:43 AM  Resp    SpO2 95 % 11/04/2017 10:43 AM  Vitals shown include unvalidated device data.  Last Pain:  Vitals:   11/04/17 0619  TempSrc: Tympanic  PainSc: 8       Patients Stated Pain Goal: 1 (37/54/36 0677)  Complications: No apparent anesthesia complications

## 2017-11-04 NOTE — Anesthesia Post-op Follow-up Note (Signed)
Anesthesia QCDR form completed.        

## 2017-11-04 NOTE — Anesthesia Procedure Notes (Signed)
Spinal  Patient location during procedure: OR Start time: 11/04/2017 7:25 AM End time: 11/04/2017 7:31 AM Staffing Performed: resident/CRNA  Preanesthetic Checklist Completed: patient identified, site marked, surgical consent, pre-op evaluation, timeout performed, IV checked, risks and benefits discussed and monitors and equipment checked Spinal Block Patient position: sitting Prep: ChloraPrep Patient monitoring: heart rate, continuous pulse ox, blood pressure and cardiac monitor Approach: midline Location: L4-5 Injection technique: single-shot Needle Needle type: Introducer and Pencan  Needle gauge: 24 G Needle length: 9 cm Additional Notes Negative paresthesia. Negative blood return. Positive free-flowing CSF. Expiration date of kit checked and confirmed. Patient tolerated procedure well, without complications.

## 2017-11-04 NOTE — Op Note (Signed)
OPERATIVE NOTE  DATE OF SURGERY:  11/04/2017  PATIENT NAME:  [name]   DOB: [birth year]  MRN: [id number]  PRE-OPERATIVE DIAGNOSIS: Degenerative arthrosis of the left hip, primary  POST-OPERATIVE DIAGNOSIS:  Same  PROCEDURE:  Left total hip arthroplasty  SURGEON:  Marciano Sequin. M.D.  ASSISTANT:  Vance Peper, PA (present and scrubbed throughout the case, critical for assistance with exposure, retraction, instrumentation, and closure)  ANESTHESIA: spinal  ESTIMATED BLOOD LOSS: 150 mL  FLUIDS REPLACED: 1350 mL of crystalloid  DRAINS: 2 medium drains to a Hemovac reservoir  IMPLANTS UTILIZED: DePuy 16.5 mm small stature AML femoral stem, 60 mm OD Pinnacle 100 acetabular component, +4 mm 10 degree Pinnacle Marathon polyethylene insert, and a 36 mm M-SPEC +1.5 mm hip ball  INDICATIONS FOR SURGERY: [name] is a 72 y.o. year old male with a long history of progressive hip and groin  pain. X-rays demonstrated severe degenerative changes. The patient had not seen any significant improvement despite conservative nonsurgical intervention. After discussion of the risks and benefits of surgical intervention, the patient expressed understanding of the risks benefits and agree with plans for total hip arthroplasty.   The risks, benefits, and alternatives were discussed at length including but not limited to the risks of infection, bleeding, nerve injury, stiffness, blood clots, the need for revision surgery, limb length inequality, dislocation, cardiopulmonary complications, among others, and they were willing to proceed.  PROCEDURE IN DETAIL: The patient was brought into the operating room and, after adequate spinal anesthesia was achieved, the patient was placed in a right lateral decubitus position. Axillary roll was placed and all bony prominences were well-padded. The patient's left hip was cleaned and prepped with alcohol and DuraPrep and draped in the usual sterile fashion. A  "timeout" was performed as per usual protocol. A lateral curvilinear incision was made gently curving towards the posterior superior iliac spine. The IT band was incised in line with the skin incision and the fibers of the gluteus maximus were split in line. The piriformis tendon was identified, skeletonized, and incised at its insertion to the proximal femur and reflected posteriorly. A T type posterior capsulotomy was performed. Prior to dislocation of the femoral head, a threaded Steinmann pin was inserted through a separate stab incision into the pelvis superior to the acetabulum and bent in the form of a stylus so as to assess limb length and hip offset throughout the procedure. The femoral head was then dislocated posteriorly. Inspection of the femoral head demonstrated severe degenerative changes with full-thickness loss of articular cartilage. The femoral neck cut was performed using an oscillating saw. The anterior capsule was elevated off of the femoral neck using a periosteal elevator. Attention was then directed to the acetabulum. The remnant of the labrum was excised using electrocautery. Inspection of the acetabulum also demonstrated significant degenerative changes. The acetabulum was reamed in sequential fashion up to a 59 mm diameter. Good punctate bleeding bone was encountered. A 60 mm Pinnacle 100 acetabular component was positioned and impacted into place. Good scratch fit was appreciated. A 4 mm neutral polyethylene trial was inserted.  Attention was then directed to the proximal femur. A hole for reaming of the proximal femoral canal was created using a high-speed burr. The femoral canal was reamed in sequential fashion up to a 16 mm diameter. This allowed for approximately 6 cm of scratch fit.  It was thus elected to ream up to a 16.5 mm diameter to allow for a line  to line fit.  Serial broaches were inserted up to a 16.5 mm small stature femoral broach. Calcar region was planed and a trial  reduction was performed using a 36 mm hip ball with a +1.5 mm neck length.  Reasonably good stability was noted but it was elected to trial with a +4 mm 10 degree polyethylene trial with a high side at 4 o'clock position.  Good equalization of limb lengths and hip offset was appreciated and excellent stability was noted both anteriorly and posteriorly. Trial components were removed. The acetabular shell was irrigated with copious amounts of normal saline with antibiotic solution and suctioned dry. A +4 mm 10 degree Pinnacle Marathon polyethylene insert was positioned with the high side at the 4 o'clock position and and impacted into place. Next, a 16.5 mm small stature AML femoral stem was positioned and impacted into place. Excellent scratch fit was appreciated. A trial reduction was again performed with a 36 mm hip ball with a +1.5 mm neck length. Again, good equalization of limb lengths was appreciated and excellent stability appreciated both anteriorly and posteriorly. The hip was then dislocated and the trial hip ball was removed. The Morse taper was cleaned and dried. A 36 mm M-SPEC hip ball with a +1.5 mm neck length was placed on the trunnion and impacted into place. The hip was then reduced and placed through range of motion. Excellent stability was appreciated both anteriorly and posteriorly.  The wound was irrigated with copious amounts of normal saline with antibiotic solution and suctioned dry. Good hemostasis was appreciated. The posterior capsulotomy was repaired using #5 Ethibond. Piriformis tendon was reapproximated to the undersurface of the gluteus medius tendon using #5 Ethibond. Two medium drains were placed in the wound bed and brought out through separate stab incisions to be attached to a Hemovac reservoir. The IT band was reapproximated using interrupted sutures of #1 Vicryl. Subcutaneous tissue was approximated using first #0 Vicryl followed by #2-0 Vicryl. The skin was closed with skin  staples.  The patient tolerated the procedure well and was transported to the recovery room in stable condition.   Marciano Sequin., M.D.

## 2017-11-04 NOTE — Evaluation (Signed)
Physical Therapy Evaluation Patient Details Name: [name] MRN: [id number] DOB: [birth year] Today's Date: 11/04/2017   History of Present Illness  Pt is a 72 yo M diagnosed with degenerative arthrosis of the left hip and is s/p elective L THA with posterior approach.  PMH includes: R THA, COPD, sleep apnea with CPAP, HTN, CAD, MI, PVD, and A-fib with pacemaker placement.      Clinical Impression  Pt presents with deficits in strength, transfers, mobility, gait, balance, and activity tolerance but overall did very well especially considering POD 0 status.  Pt was Mod Ind with sup to/from sit with extra time and effort required by no physical assistance.  Pt was able to stand from an elevated EOB with CGA and cues for sequencing to maintain hip precautions.  Pt ambulated around 5' at EOB forwards/backwards and side-stepping with good stability and only minimal increase in L hip pain.  Pt should progress well in acute care and will benefit from HHPT services upon discharge to safely address above deficits for decreased caregiver assistance and eventual return to PLOF.      Follow Up Recommendations Home health PT    Equipment Recommendations  None recommended by PT    Recommendations for Other Services       Precautions / Restrictions Precautions Precautions: Fall;Posterior Hip Precaution Booklet Issued: Yes (comment) Precaution Comments: Posterior hip precaution education and review provided to pt and spouse Restrictions Weight Bearing Restrictions: Yes LLE Weight Bearing: Weight bearing as tolerated      Mobility  Bed Mobility Overal bed mobility: Modified Independent             General bed mobility comments: Extra time and effort during sup to/from sit but no physical assistance required  Transfers Overall transfer level: Needs assistance Equipment used: Rolling walker (2 wheeled) Transfers: Sit to/from Stand Sit to Stand: From elevated surface;Min guard          General transfer comment: Mod verbal cues for sequencing to maintain posterior hip precautions  Ambulation/Gait Ambulation/Gait assistance: Min guard Ambulation Distance (Feet): 5 Feet Assistive device: Rolling walker (2 wheeled) Gait Pattern/deviations: Step-to pattern Gait velocity: decreased   General Gait Details: Pt steady with amb at EOB with some minor increase in L hip pain; Pt's SpO2 95% with HR 63 bpm after limited Amb on room air  Stairs            Wheelchair Mobility    Modified Rankin (Stroke Patients Only)       Balance Overall balance assessment: Needs assistance Sitting-balance support: Feet supported;Bilateral upper extremity supported Sitting balance-Leahy Scale: Good     Standing balance support: Bilateral upper extremity supported Standing balance-Leahy Scale: Good                               Pertinent Vitals/Pain Pain Assessment: 0-10 Pain Score: 5  Pain Location: L hip Pain Descriptors / Indicators: Sore Pain Intervention(s): Premedicated before session;Monitored during session;Limited activity within patient's tolerance    Home Living Family/patient expects to be discharged to:: Private residence Living Arrangements: Spouse/significant other Available Help at Discharge: Family;Available 24 hours/day Type of Home: House Home Access: Stairs to enter Entrance Stairs-Rails: Right;Left;Can reach both Entrance Stairs-Number of Steps: 7 Home Layout: One level Home Equipment: Walker - 2 wheels;Cane - single point;Bedside commode      Prior Function Level of Independence: Independent with assistive device(s)  Comments: Pt Mod Ind with Amb with a SPC with 4-5 falls in the last 12 months from tripping or just losing balance, Ind with ADLs     Hand Dominance        Extremity/Trunk Assessment   Upper Extremity Assessment Upper Extremity Assessment: Overall WFL for tasks assessed    Lower Extremity  Assessment Lower Extremity Assessment: Generalized weakness;LLE deficits/detail LLE Deficits / Details: LLE ankle DF and PF strong against manual resistance, L hip flex at least 3/5, L knee ext and flex 4/5 LLE: Unable to fully assess due to pain LLE Sensation: WNL       Communication   Communication: No difficulties  Cognition Arousal/Alertness: Awake/alert Behavior During Therapy: WFL for tasks assessed/performed Overall Cognitive Status: Within Functional Limits for tasks assessed                                        General Comments      Exercises Total Joint Exercises Ankle Circles/Pumps: AROM;Both;10 reps;5 reps Quad Sets: Strengthening;Both;5 reps;10 reps Gluteal Sets: Strengthening;Both;10 reps Hip ABduction/ADduction: AROM;Both;5 reps Straight Leg Raises: AROM;Both;5 reps(Small amplitude on the LLE) Long Arc Quad: AROM;Both;10 reps Knee Flexion: AROM;Both;10 reps Marching in Standing: AROM;Both;10 reps   Assessment/Plan    PT Assessment Patient needs continued PT services  PT Problem List Decreased strength;Decreased activity tolerance;Decreased balance;Decreased mobility;Decreased knowledge of use of DME;Decreased safety awareness;Decreased knowledge of precautions       PT Treatment Interventions DME instruction;Gait training;Stair training;Functional mobility training;Balance training;Therapeutic exercise;Therapeutic activities;Patient/family education    PT Goals (Current goals can be found in the Care Plan section)  Acute Rehab PT Goals Patient Stated Goal: To dance with my wife PT Goal Formulation: With patient Time For Goal Achievement: 11/17/17 Potential to Achieve Goals: Good    Frequency BID   Barriers to discharge        Co-evaluation               AM-PAC PT "6 Clicks" Daily Activity  Outcome Measure Difficulty turning over in bed (including adjusting bedclothes, sheets and blankets)?: A Little Difficulty moving from  lying on back to sitting on the side of the bed? : A Little Difficulty sitting down on and standing up from a chair with arms (e.g., wheelchair, bedside commode, etc,.)?: Unable Help needed moving to and from a bed to chair (including a wheelchair)?: A Little Help needed walking in hospital room?: A Little Help needed climbing 3-5 steps with a railing? : A Little 6 Click Score: 16    End of Session Equipment Utilized During Treatment: Gait belt Activity Tolerance: Patient tolerated treatment well Patient left: in bed;with SCD's reapplied;Other (comment);with family/visitor present;with bed alarm set;with call bell/phone within reach(ice pack to L hip) Nurse Communication: Mobility status PT Visit Diagnosis: Muscle weakness (generalized) (M62.81);Other abnormalities of gait and mobility (R26.89)    Time: 3893-7342 PT Time Calculation (min) (ACUTE ONLY): 37 min   Charges:   PT Evaluation $PT Eval Low Complexity: 1 Low PT Treatments $Therapeutic Exercise: 8-22 mins $Therapeutic Activity: 8-22 mins   PT G Codes:        DRoyetta Asal PT, DPT 11/04/17, 4:45 PM

## 2017-11-04 NOTE — Anesthesia Preprocedure Evaluation (Signed)
Anesthesia Evaluation  Patient identified by MRN, date of birth, ID band Patient awake    Reviewed: Allergy & Precautions, NPO status , Patient's Chart, lab work & pertinent test results, reviewed documented beta blocker date and time   Airway Mallampati: III  TM Distance: >3 FB     Dental  (+) Chipped   Pulmonary shortness of breath, sleep apnea and Continuous Positive Airway Pressure Ventilation , COPD, former smoker,           Cardiovascular hypertension, Pt. on medications and Pt. on home beta blockers + CAD, + Past MI, + Peripheral Vascular Disease and +CHF  + dysrhythmias Atrial Fibrillation + pacemaker      Neuro/Psych PSYCHIATRIC DISORDERS Anxiety TIA   GI/Hepatic   Endo/Other    Renal/GU      Musculoskeletal  (+) Arthritis ,   Abdominal   Peds  Hematology   Anesthesia Other Findings   Reproductive/Obstetrics                             Anesthesia Physical Anesthesia Plan  ASA: III  Anesthesia Plan: Spinal   Post-op Pain Management:    Induction:   PONV Risk Score and Plan:   Airway Management Planned:   Additional Equipment:   Intra-op Plan:   Post-operative Plan:   Informed Consent: I have reviewed the patients History and Physical, chart, labs and discussed the procedure including the risks, benefits and alternatives for the proposed anesthesia with the patient or authorized representative who has indicated his/her understanding and acceptance.     Plan Discussed with: CRNA  Anesthesia Plan Comments:         Anesthesia Quick Evaluation

## 2017-11-04 NOTE — Clinical Social Work Note (Signed)
CSW received consult that patient may need SNF placement awaiting PT recommendations.  Jones Broom. [place], MSW, [place]  11/04/2017 1:17 PM

## 2017-11-04 NOTE — Progress Notes (Signed)
Patient was admitted to room 147 from OR. A&O x4. Foley, Hemovac, TEDs and foot pumps in place. IV fluids started. Bed alarm on for safety. Reviewed POC. Allowed time for questions.

## 2017-11-04 NOTE — H&P (Signed)
The patient has been re-examined, and the chart reviewed, and there have been no interval changes to the documented history and physical.    The risks, benefits, and alternatives have been discussed at length. The patient expressed understanding of the risks benefits and agreed with plans for surgical intervention.  Jeryl Wilbourn P. Lennie Vasco, Jr. M.D.    

## 2017-11-04 NOTE — Discharge Instructions (Signed)
Instructions after Total Hip Replacement ° ° °  Jered Heiny P. Ayauna Mcnay, Jr., M.D.    ° Dept. of Orthopaedics & Sports Medicine ° [facility] ° [address] ° [place], [place]  [address] ° Phone: [phone]   Fax: [phone] ° °  °DIET: °• Drink plenty of non-alcoholic fluids. °• Resume your normal diet. Include foods high in fiber. ° °ACTIVITY:  °• You may use crutches or a walker with weight-bearing as tolerated, unless instructed otherwise. °• You may be weaned off of the walker or crutches by your Physical Therapist.  °• Do NOT reach below the level of your knees or cross your legs until allowed.    °• Continue doing gentle exercises. Exercising will reduce the pain and swelling, increase motion, and prevent muscle weakness.   °• Please continue to use the TED compression stockings for 6 weeks. You may remove the stockings at night, but should reapply them in the morning. °• Do not drive or operate any equipment until instructed. ° °WOUND CARE:  °• Continue to use ice packs periodically to reduce pain and swelling. °• Keep the incision clean and dry. °• You may bathe or shower after the staples are removed at the first office visit following surgery. ° °MEDICATIONS: °• You may resume your regular medications. °• Please take the pain medication as prescribed on the medication. °• Do not take pain medication on an empty stomach. °• You have been given a prescription for a blood thinner to prevent blood clots. Please take the medication as instructed. (NOTE: After completing a 2 week course of Lovenox, take one Enteric-coated aspirin once a day.) °• Pain medications and iron supplements can cause constipation. Use a stool softener (Senokot or Colace) on a daily basis and a laxative (dulcolax or miralax) as needed. °• Do not drive or drink alcoholic beverages when taking pain medications. ° °CALL THE OFFICE FOR: °• Temperature above 101 degrees °• Excessive bleeding or drainage on the dressing. °• Excessive  swelling, coldness, or paleness of the toes. °• Persistent nausea and vomiting. ° °FOLLOW-UP:  °• You should have an appointment to return to the office in 6 weeks after surgery. °• Arrangements have been made for continuation of Physical Therapy (either home therapy or outpatient therapy). °  °

## 2017-11-05 NOTE — Evaluation (Signed)
Occupational Therapy Evaluation Patient Details Name: [name] MRN: [id number] DOB: 06-Aug-1945 Today's Date: 11/05/2017    History of Present Illness Pt is a 72 yo M diagnosed with degenerative arthrosis of the left hip and is s/p elective L THA with posterior approach.  PMH includes: R THA, COPD, sleep apnea with CPAP, HTN, CAD, MI, PVD, and A-fib with pacemaker placement.     Clinical Impression   Pt seen for OT evaluation this date. Pt lives at home with his spouse. Pt was independent in all ADLs prior to surgery and is eager to return to PLOF.  Pt is currently minimally limited in functional ADLs due to pain, decreased ROM, and need to maintain posterior total hip precautions. Pt had previous R THA approx 1 year ago and pt was able to verbalize 3/3 posterior THPs and how to implement during mobility and ADL tasks without VC for sequencing or safety. Pt requires minimal assist for LB dressing and bathing skills due to pain, decreased AROM of L LE, and posterior THPs. Pt instructed in AE/DME for bathing and dressing, falls prevention, and review of compression stocking mgt. Pt verbalized understanding of all education/training provided completed. No additional OT needs at this time.     Follow Up Recommendations  No OT follow up    Equipment Recommendations  None recommended by OT    Recommendations for Other Services       Precautions / Restrictions Precautions Precautions: Fall;Posterior Hip Precaution Comments: Posterior hip precaution education and review provided, pt able to teach back precautions and how to maintain during mobility and ADL tasks with no verbal cues  Restrictions Weight Bearing Restrictions: Yes LLE Weight Bearing: Weight bearing as tolerated      Mobility Bed Mobility              General bed mobility comments: deferred, up in recliner  Transfers Overall transfer level: Needs assistance Equipment used: Rolling walker (2 wheeled) Transfers:  Sit to/from Stand Sit to Stand: Min guard         General transfer comment: vc for sequencing to maintain precautions    Balance Overall balance assessment: Needs assistance Sitting-balance support: Feet supported;Bilateral upper extremity supported Sitting balance-Leahy Scale: Good     Standing balance support: Bilateral upper extremity supported Standing balance-Leahy Scale: Good                             ADL either performed or assessed with clinical judgement   ADL Overall ADL's : Needs assistance/impaired Eating/Feeding: Independent   Grooming: Standing;Supervision/safety   Upper Body Bathing: Sitting;Modified independent   Lower Body Bathing: Sit to/from stand;Minimal assistance Lower Body Bathing Details (indicate cue type and reason): instructed in seated sponge bath and AE to maintain precautions and recommended seated initial shower once cleared Upper Body Dressing : Sitting;Modified independent   Lower Body Dressing: Sit to/from stand;Minimal assistance Lower Body Dressing Details (indicate cue type and reason): instructed in use of AE for LB dressing to maintain his precautions Toilet Transfer: RW;Min guard;Comfort height toilet;Ambulation           Functional mobility during ADLs: Min guard;Rolling walker       Vision Baseline Vision/History: Wears glasses Wears Glasses: At all times Patient Visual Report: No change from baseline       Perception     Praxis      Pertinent Vitals/Pain Pain Assessment: 0-10 Pain Score: 4  Pain Location: L  hip Pain Descriptors / Indicators: Sore Pain Intervention(s): Limited activity within patient's tolerance;Monitored during session     Hand Dominance     Extremity/Trunk Assessment Upper Extremity Assessment Upper Extremity Assessment: Overall WFL for tasks assessed   Lower Extremity Assessment Lower Extremity Assessment: Defer to PT evaluation LLE: Unable to fully assess due to pain LLE  Sensation: WNL   Cervical / Trunk Assessment Cervical / Trunk Assessment: Normal   Communication Communication Communication: No difficulties   Cognition Arousal/Alertness: Awake/alert Behavior During Therapy: WFL for tasks assessed/performed Overall Cognitive Status: Within Functional Limits for tasks assessed                                     General Comments       Exercises Other Exercises Other Exercises: pt instructed in falls prevention strategies and reviewed compression stockings mgt Other Exercises: HEP education and review with pt and pt's daughter.   Shoulder Instructions      Home Living Family/patient expects to be discharged to:: Private residence Living Arrangements: Spouse/significant other Available Help at Discharge: Family;Available 24 hours/day Type of Home: House Home Access: Stairs to enter [employer] of Steps: 7 Entrance Stairs-Rails: Right;Left;Can reach both Home Layout: One level     Bathroom Shower/Tub: Occupational psychologist: Handicapped height Bathroom Accessibility: Yes   Home Equipment: Environmental consultant - 2 wheels;Cane - single point;Bedside commode;Shower seat;Adaptive equipment;Hand held shower head Adaptive Equipment: Reacher;Sock aid;Long-handled shoe horn        Prior Functioning/Environment Level of Independence: Independent with assistive device(s)        Comments: Pt Mod Ind with Amb with a SPC with 4-5 falls in the last 12 months from tripping or just losing balance, Ind with ADLs        OT Problem List: Pain;Decreased strength;Decreased range of motion      OT Treatment/Interventions:      OT Goals(Current goals can be found in the care plan section) Acute Rehab OT Goals Patient Stated Goal: To dance with my wife OT Goal Formulation: All assessment and education complete, DC therapy  OT Frequency:     Barriers to D/C:            Co-evaluation              AM-PAC PT "6  Clicks" Daily Activity     Outcome Measure Help from another person eating meals?: None Help from another person taking care of personal grooming?: None Help from another person toileting, which includes using toliet, bedpan, or urinal?: None Help from another person bathing (including washing, rinsing, drying)?: A Little Help from another person to put on and taking off regular upper body clothing?: None Help from another person to put on and taking off regular lower body clothing?: A Little 6 Click Score: 22   End of Session    Activity Tolerance: Patient tolerated treatment well Patient left: in chair;with call bell/phone within reach;with chair alarm set;with family/visitor present;with SCD's reapplied  OT Visit Diagnosis: Other abnormalities of gait and mobility (R26.89)                Time: 3532-9924 OT Time Calculation (min): 23 min Charges:  OT General Charges $OT Visit: 1 Visit OT Evaluation $OT Eval Low Complexity: 1 Low OT Treatments $Self Care/Home Management : 8-22 mins  Jeni Salles, MPH, MS, OTR/L ascom [phone] 11/05/17, 4:01 PM

## 2017-11-05 NOTE — Progress Notes (Signed)
Physical Therapy Treatment Patient Details Name: [name] MRN: [id number] DOB: 1946-01-01 Today's Date: 11/05/2017    History of Present Illness Pt is a 72 yo M diagnosed with degenerative arthrosis of the left hip and is s/p elective L THA with posterior approach.  PMH includes: R THA, COPD, sleep apnea with CPAP, HTN, CAD, MI, PVD, and A-fib with pacemaker placement.      PT Comments    Pt presents with deficits in strength, transfers, mobility, gait, balance, and activity tolerance.  Nurse in room upon entry having just taken supine BP at 84/45 mmHg and pain meds held secondary to low BP.  Nursing gave ok for orthostatics with sitting BP 105/57 mmHg and standing BP 110/62 mmHg all without symptoms, nursing updated.  Pt required increased effort with bed mobility tasks this session and reinforcement during functional mobility on maintaining hip precautions.  Pt able to stand from an elevated EOB with CGA and cues for sequencing.  Pt's ambulation was limited this session by pain and concerns of pt and family of pain increasing secondary to pain meds being held per above.  Will attempt to progress amb during PM session.  Pt will benefit from HHPT services upon discharge to safely address above deficits for decreased caregiver assistance and eventual return to PLOF.     Follow Up Recommendations  Home health PT     Equipment Recommendations  None recommended by PT    Recommendations for Other Services       Precautions / Restrictions Precautions Precautions: Fall;Posterior Hip Precaution Booklet Issued: Yes (comment) Precaution Comments: Posterior hip precaution education and review provided to pt and daughter Restrictions Weight Bearing Restrictions: Yes LLE Weight Bearing: Weight bearing as tolerated    Mobility  Bed Mobility Overal bed mobility: Modified Independent             General bed mobility comments: Extra time and effort during sup to/from sit but no  physical assistance required  Transfers Overall transfer level: Needs assistance Equipment used: Rolling walker (2 wheeled) Transfers: Sit to/from Stand Sit to Stand: From elevated surface;Min guard         General transfer comment: Mod verbal cues for sequencing to maintain posterior hip precautions  Ambulation/Gait Ambulation/Gait assistance: Min guard Ambulation Distance (Feet): 10 Feet Assistive device: Rolling walker (2 wheeled) Gait Pattern/deviations: Step-to pattern Gait velocity: decreased   General Gait Details: Pt steady with amb at EOB with minor increase in L hip pain   Stairs             Wheelchair Mobility    Modified Rankin (Stroke Patients Only)       Balance Overall balance assessment: Needs assistance Sitting-balance support: Feet supported;Bilateral upper extremity supported Sitting balance-Leahy Scale: Good     Standing balance support: Bilateral upper extremity supported Standing balance-Leahy Scale: Good                              Cognition Arousal/Alertness: Awake/alert Behavior During Therapy: WFL for tasks assessed/performed Overall Cognitive Status: Within Functional Limits for tasks assessed                                        Exercises Total Joint Exercises Ankle Circles/Pumps: AROM;Both;10 reps;5 reps Quad Sets: Strengthening;Both;5 reps;10 reps Gluteal Sets: Strengthening;Both;10 reps Hip ABduction/ADduction: AAROM;Left;10 reps Straight Leg Raises:  10 reps;AAROM;Left Long Arc Quad: 10 reps;AROM;Both;15 reps Knee Flexion: AROM;Both;10 reps;15 reps Marching in Standing: AROM;Both;10 reps Other Exercises Other Exercises: Posterior hip precaution education provided to pt and pt's daughter with pt initially recalling 1/3. Other Exercises: HEP education and review with pt and pt's daughter.    General Comments        Pertinent Vitals/Pain Pain Assessment: 0-10 Pain Score: 5  Pain  Location: L hip Pain Descriptors / Indicators: Sore Pain Intervention(s): Monitored during session;Limited activity within patient's tolerance;Patient requesting pain meds-RN notified    Home Living                      Prior Function            PT Goals (current goals can now be found in the care plan section) Progress towards PT goals: Progressing toward goals    Frequency    BID      PT Plan Current plan remains appropriate    Co-evaluation              AM-PAC PT "6 Clicks" Daily Activity  Outcome Measure                   End of Session Equipment Utilized During Treatment: Gait belt Activity Tolerance: Patient tolerated treatment well Patient left: in chair;with call bell/phone within reach;with chair alarm set;with SCD's reapplied;with family/visitor present Nurse Communication: Mobility status PT Visit Diagnosis: Muscle weakness (generalized) (M62.81);Other abnormalities of gait and mobility (R26.89)     Time: 4196-2229 PT Time Calculation (min) (ACUTE ONLY): 40 min  Charges:  $Therapeutic Exercise: 23-37 mins $Therapeutic Activity: 8-22 mins                    G Codes:       DRoyetta Asal PT, DPT 11/05/17, 12:19 PM

## 2017-11-05 NOTE — Progress Notes (Signed)
Physical Therapy Treatment Patient Details Name: [name] MRN: [id number] DOB: 1946/05/04 Today's Date: 11/05/2017    History of Present Illness Pt is a 72 yo M diagnosed with degenerative arthrosis of the left hip and is s/p elective L THA with posterior approach.  PMH includes: R THA, COPD, sleep apnea with CPAP, HTN, CAD, MI, PVD, and A-fib with pacemaker placement.      PT Comments    Pt presents with deficits in strength, transfers, mobility, gait, balance, and activity tolerance but made very good progress this session.  Pt's BP in sitting 121/67 mmHg and was asymptomatic throughout the session.  Pt increased amb this session to 1 x 100' and 1 x 150' with step-through gait pattern and good stability along with very little if any antalgic gait pattern.  Pt ascended and descended 4 steps with B rails with good sequencing and stability.  Pt's SpO2 on room air remained >/= 96% throughout session.  Pt will benefit from HHPT services upon discharge to safely address above deficits for decreased caregiver assistance and eventual return to PLOF.     Follow Up Recommendations  Home health PT     Equipment Recommendations       Recommendations for Other Services       Precautions / Restrictions Precautions Precautions: Fall;Posterior Hip Precaution Booklet Issued: Yes (comment) Precaution Comments: Posterior hip precaution education and review provided, pt able to teach back precautions and how to maintain during mobility and ADL tasks with no verbal cues  Restrictions Weight Bearing Restrictions: Yes LLE Weight Bearing: Weight bearing as tolerated    Mobility  Bed Mobility Overal bed mobility: Modified Independent             General bed mobility comments: Extra time and effort during sit to sup but no physical assistance required   Transfers Overall transfer level: Needs assistance Equipment used: Rolling walker (2 wheeled) Transfers: Sit to/from Stand Sit to  Stand: Min guard         General transfer comment: Min verbal and visual cues for sequencing to maintain precautions  Ambulation/Gait Ambulation/Gait assistance: Min guard Ambulation Distance (Feet): 150 Feet Assistive device: Rolling walker (2 wheeled) Gait Pattern/deviations: Step-through pattern Gait velocity: decreased   General Gait Details: Fair cadence and good stability during amb with minimal if any antalgic pattern, SpO2 >/= 96% during session with HR in the low to upper 70s throughout.    Stairs Stairs: Yes Stairs assistance: Min guard Stair Management: Two rails Number of Stairs: 4 General stair comments: Pt steady ascending and descending steps with good recall of proper sequencing   Wheelchair Mobility    Modified Rankin (Stroke Patients Only)       Balance Overall balance assessment: Needs assistance Sitting-balance support: Feet supported;Bilateral upper extremity supported Sitting balance-Leahy Scale: Good     Standing balance support: Bilateral upper extremity supported Standing balance-Leahy Scale: Good                              Cognition Arousal/Alertness: Awake/alert Behavior During Therapy: WFL for tasks assessed/performed Overall Cognitive Status: Within Functional Limits for tasks assessed                                        Exercises Total Joint Exercises Ankle Circles/Pumps: AROM;Both;10 reps;5 reps Quad Sets: Strengthening;Both;5 reps;10 reps Gluteal  Sets: Strengthening;Both;10 reps;5 reps Straight Leg Raises: AROM;Both;10 reps(Small amplitude on the LLE) Long Arc Quad: 10 reps;AROM;15 reps;Left Knee Flexion: AROM;10 reps;15 reps;Left Marching in Standing: AROM;Both;10 reps Other Exercises Other Exercises: pt instructed in falls prevention strategies and reviewed compression stockings mgt    General Comments        Pertinent Vitals/Pain Pain Assessment: 0-10 Pain Score: 3  Pain Location: L  hip Pain Descriptors / Indicators: Sore Pain Intervention(s): Premedicated before session;Monitored during session    Home Living Family/patient expects to be discharged to:: Private residence Living Arrangements: Spouse/significant other Available Help at Discharge: Family;Available 24 hours/day Type of Home: House Home Access: Stairs to enter Entrance Stairs-Rails: Right;Left;Can reach both Home Layout: One level Home Equipment: Environmental consultant - 2 wheels;Cane - single point;Bedside commode;Shower seat;Adaptive equipment;Hand held shower head      Prior Function Level of Independence: Independent with assistive device(s)      Comments: Pt Mod Ind with Amb with a SPC with 4-5 falls in the last 12 months from tripping or just losing balance, Ind with ADLs   PT Goals (current goals can now be found in the care plan section) Acute Rehab PT Goals Patient Stated Goal: To dance with my wife Progress towards PT goals: Progressing toward goals    Frequency    BID      PT Plan Current plan remains appropriate    Co-evaluation              AM-PAC PT "6 Clicks" Daily Activity  Outcome Measure                   End of Session Equipment Utilized During Treatment: Gait belt Activity Tolerance: Patient tolerated treatment well Patient left: in bed;with call bell/phone within reach;with bed alarm set;with SCD's reapplied Nurse Communication: Mobility status PT Visit Diagnosis: Muscle weakness (generalized) (M62.81);Other abnormalities of gait and mobility (R26.89)     Time: 1455-1536 PT Time Calculation (min) (ACUTE ONLY): 41 min  Charges:  $Gait Training: 8-22 mins $Therapeutic Exercise: 8-22 mins $Therapeutic Activity: 8-22 mins                    G Codes:       DRoyetta Asal PT, DPT 11/05/17, 4:53 PM

## 2017-11-05 NOTE — Clinical Social Work Note (Signed)
CSW received referral for SNF.  Case discussed with case manager and plan is to discharge home with home health.  CSW to sign off please re-consult if social work needs arise.  Cyan Moultrie R. Kenneshia Rehm, MSW, LCSWA [phone]  

## 2017-11-05 NOTE — Care Management Note (Signed)
Case Management Note  Patient Details  Name: KAILER HEINDEL MRN: [id number] Date of Birth: May 19, 1946  Subjective/Objective:   POD # 1 left hip arthroplasty. Met with patient. He is sitting up in chair. Patient states he wants [facility] for [facility] care because he wants a specific therapist. Nanine Means. Referral to [facility]. Patient has a walker and bsc. He is on Eliquis. It is anticipated he will discharge tomorrow.                  Action/Plan: [facility] for HHPT. No DME, Eliquis.   Expected Discharge Date:  11/06/17               Expected Discharge Plan:  [place]  In-House Referral:     Discharge planning Services  CM Consult  Post Acute Care Choice:  Home Health Choice offered to:  Patient  DME Arranged:    DME Agency:     HH Arranged:  PT [place]:  [place]  Status of Service:  In process, will continue to follow  If discussed at Long Length of Stay Meetings, dates discussed:    Additional Comments:  Jolly Mango, RN 11/05/2017, 3:54 PM

## 2017-11-05 NOTE — Anesthesia Postprocedure Evaluation (Signed)
Anesthesia Post Note  Patient: [name]  Procedure(s) Performed: TOTAL HIP ARTHROPLASTY (Left )  Patient location during evaluation: Nursing Unit Anesthesia Type: Spinal Level of consciousness: awake, awake and alert and oriented Pain management: pain level controlled Vital Signs Assessment: post-procedure vital signs reviewed and stable Respiratory status: spontaneous breathing, nonlabored ventilation and respiratory function stable Cardiovascular status: stable Anesthetic complications: no     Last Vitals:  Vitals:   11/04/17 2336 11/05/17 0733  BP: (!) 95/55 (!) 99/58  Pulse: 61 (!) 59  Resp: 17 17  Temp:  36.6 C  SpO2: 98% 95%    Last Pain:  Vitals:   11/05/17 0733  TempSrc: Oral  PainSc:                  Lance Muss

## 2017-11-05 NOTE — Progress Notes (Signed)
Subjective: 1 Day Post-Op Procedure(s) (LRB): TOTAL HIP ARTHROPLASTY (Left) Patient reports pain as 0 on 0-10 scale.  Complains of mild soreness with movement Patient is well, and has had no acute complaints or problems.  States he had a small bowel movement last night. Denies any CP, SOB, ABD pain, dizziness, lightheadedness. We will continue therapy today.  Plan is to go Home after hospital stay.  Objective: Vital signs in last 24 hours: Temp:  [96.9 F (36.1 C)-98.3 F (36.8 C)] 98.3 F (36.8 C) (04/17 1834) Pulse Rate:  [59-64] 61 (04/17 2336) Resp:  [12-20] 17 (04/17 2336) BP: (94-128)/(54-79) 95/55 (04/17 2336) SpO2:  [95 %-98 %] 98 % (04/17 2336)  Intake/Output from previous day: 04/17 0701 - 04/18 0700 In: 2240 [I.V.:2040; IV Piggyback:200] Out: 3120 [Urine:2670; Drains:125; Blood:300] Intake/Output this shift: No intake/output data recorded.  No results for input(s): HGB in the last 72 hours. No results for input(s): WBC, RBC, HCT, PLT in the last 72 hours. No results for input(s): NA, K, CL, CO2, BUN, CREATININE, GLUCOSE, CALCIUM in the last 72 hours. Recent Labs    11/04/17 0658  INR 1.11    EXAM General - Patient is Alert, Appropriate and Oriented  Abdomen: Soft nontender nondistended Extremity - Neurovascular intact Sensation intact distally Intact pulses distally Dorsiflexion/Plantar flexion intact No cellulitis present Compartment soft Dressing - dressing C/D/I, scant drainage and Hemovac intact Motor Function - intact, moving foot and toes well on exam.   Past Medical History:  Diagnosis Date  . Anxiety   . Arthritis   . Atrial fibrillation ([place])    a. Dx 2013, recurred 02/2014, CHA2DS2VASc = 3 -->placed on Eliquis;  b. 02/2014 Echo: EF 50-55%, mid and apical anterior septum and mid and apical inf septum are abnl, mild to mod Ao sclerosis w/o AS.  Marland Kitchen Chicken pox   . Chronic lymphocytic leukemia ([place])    a. Dx 02/2014.  Marland Kitchen Complication of  anesthesia    History of  PTSD--do not touch patient when waking up from surgery.  Marland Kitchen COPD (chronic obstructive pulmonary disease) ([place])   . Coronary artery disease    a. 04/2009 CABG x 3 (LIMA->LAD, VG->OM1, VG->PDA);  b. 09/2009 Cath: occluded VG x 2 w/ patent LIMA and L->R collats. EF 55%, mild antlat HK;  c. 10/2011 MV: EF 53%, no isch/infarct-->low risk.  Marland Kitchen Dysrhythmia    hx of a-fib  . GERD (gastroesophageal reflux disease)    occasional  . History of chemotherapy 2015-2016  . HOH (hard of hearing)    Bilateral Hearing Aids  . Hypertension   . Myocardial infarction ([place]) 2010  . OSA on CPAP    USE C-PAP  . Presence of permanent cardiac pacemaker 2017  . PTSD (post-traumatic stress disorder)   . PTSD (post-traumatic stress disorder)   . Pure hypercholesterolemia   . Rheumatic fever 1959    Assessment/Plan:   1 Day Post-Op Procedure(s) (LRB): TOTAL HIP ARTHROPLASTY (Left) Active Problems:   Status post total replacement of hip  Estimated body mass index is 32.93 kg/m as calculated from the following:   Height as of this encounter: 5\' 9"  (1.753 m).   Weight as of this encounter: 101.2 kg (223 lb). [facility] diet Up with therapy  Pain well controlled. Vital signs are stable Care management to assist with discharge to home with home health PT.  Patient will likely discharge home tomorrow.   DVT Prophylaxis - Foot Pumps, TED hose and Eliquis Weight-Bearing as tolerated to  left leg   T. Rachelle Hora, PA-C [facility] 11/05/2017, 7:15 AM

## 2017-11-06 MED ORDER — OXYCODONE HCL 5 MG PO TABS: 5.0000 mg | ORAL_TABLET | ORAL | 0 refills | Status: DC | PRN

## 2017-11-06 NOTE — Discharge Summary (Signed)
Physician Discharge Summary  Patient ID: [name] MRN: [id number] DOB/AGE: 73-Jun-1947 73 y.o.  Admit date: 11/04/2017 Discharge date: 11/06/2017  Admission Diagnoses:  OSTEOARTHRITIS LEFT HIP   Discharge Diagnoses: Patient Active Problem List   Diagnosis Date Noted  . Status post total replacement of hip 11/04/2017  . Yellow jacket sting 03/09/2017  . 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 Sick sinus syndrome ([place]) 07/08/2016  . Annual physical exam 07/08/2016  . 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 Chronic fatigue   . 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 PAD (peripheral artery disease) ([place])   . OSA on CPAP   . TIA (transient ischemic attack) 11/02/2015  . Chronic diastolic CHF (congestive heart failure) ([place]) 09/20/2015  . CLL (chronic lymphocytic leukemia) ([place])   . PTSD (post-traumatic stress disorder)   . Osteoarthritis of both knees 07/05/2015  . COPD (chronic obstructive pulmonary disease) ([place]) 01/01/2012  . Shortness of breath 10/09/2011  . Paroxysmal atrial fibrillation ([place]) 10/09/2011  . Hyperlipidemia 11/28/2009  . Essential hypertension 11/28/2009    Past Medical History:  Diagnosis Date  . Anxiety   . Arthritis   . Atrial fibrillation ([place])    a. Dx 2013, recurred 02/2014, CHA2DS2VASc = 3 -->placed on Eliquis;  b. 02/2014 Echo: EF 50-55%, mid and apical anterior septum and mid and apical inf septum are abnl, mild to mod Ao sclerosis w/o AS.  Marland Kitchen Chicken pox   . Chronic lymphocytic leukemia ([place])    a. Dx 02/2014.  Marland Kitchen Complication of anesthesia    History of  PTSD--do not touch patient when waking up from surgery.  Marland Kitchen COPD (chronic obstructive pulmonary disease) ([place])   . Coronary artery disease    a. 04/2009 CABG x 3 (LIMA->LAD, VG->OM1, VG->PDA);  b. 09/2009 Cath: occluded VG x 2 w/ patent LIMA and L->R collats. EF 55%, mild antlat HK;  c. 10/2011 MV: EF 53%, no isch/infarct-->low risk.  Marland Kitchen  Dysrhythmia    hx of a-fib  . GERD (gastroesophageal reflux disease)    occasional  . History of chemotherapy 2015-2016  . HOH (hard of hearing)    Bilateral Hearing Aids  . Hypertension   . Myocardial infarction ([place]) 2010  . OSA on CPAP    USE C-PAP  . Presence of permanent cardiac pacemaker 2017  . PTSD (post-traumatic stress disorder)   . PTSD (post-traumatic stress disorder)   . Pure hypercholesterolemia   . Rheumatic fever 1959     Transfusion: No transfusions during this admission   Consultants (if any):   Discharged Condition: Improved  Hospital Course: SPERO GUNNELS is an 72 y.o. male who was admitted 11/04/2017 with a diagnosis of degenerative arthrosis left hip and went to the operating room on 11/04/2017 and underwent the above named procedures.    Surgeries:Procedure(s): TOTAL HIP ARTHROPLASTY on 11/04/2017  PRE-OPERATIVE DIAGNOSIS: Degenerative arthrosis of the left hip, primary  POST-OPERATIVE DIAGNOSIS:  Same  PROCEDURE:  Left total hip arthroplasty  SURGEON:  Marciano Sequin. M.D.  ASSISTANT:  Vance Peper, PA (present and scrubbed throughout the case, critical for assistance with exposure, retraction, instrumentation, and closure)  ANESTHESIA: spinal  ESTIMATED BLOOD LOSS: 150 mL  FLUIDS REPLACED: 1350 mL of crystalloid  DRAINS: 2 medium drains to a Hemovac reservoir  IMPLANTS UTILIZED: DePuy 16.5 mm small stature AML femoral stem, 60 mm OD Pinnacle 100 acetabular component, +4 mm 10 degree Pinnacle Marathon polyethylene insert, and  a 36 mm M-SPEC +1.5 mm hip ball  INDICATIONS FOR SURGERY: [name] is a 72 y.o. year old male with a long history of progressive hip and groin  pain. X-rays demonstrated severe degenerative changes. The patient had not seen any significant improvement despite conservative nonsurgical intervention. After discussion of the risks and benefits of surgical intervention, the patient expressed understanding of the  risks benefits and agree with plans for total hip arthroplasty.   The risks, benefits, and alternatives were discussed at length including but not limited to the risks of infection, bleeding, nerve injury, stiffness, blood clots, the need for revision surgery, limb length inequality, dislocation, cardiopulmonary complications, among others, and they were willing to proceed.   Patient tolerated the surgery well. No complications .Patient was taken to PACU where she was stabilized and then transferred to the orthopedic floor.  Patient started on Eliquis 5 mg every 12 hours. Foot pumps applied bilaterally at 80 mm hgb. Heels elevated off bed with rolled towels. No evidence of DVT. Calves non tender. Negative [place]oman. Physical therapy started on day #1 for gait training and transfer with OT starting on  day #1 for ADL and assisted devices. Patient has done well with therapy. Ambulated greater than 200 feet upon being discharged.  Was able to ascend and descend 4 steps safely and independently  Patient's IV And Foley were discontinued on day #1 with Hemovac being discontinued on day #2. Dressing was changed on day 2 prior to patient being discharged   He was given perioperative antibiotics:  Anti-infectives (From admission, onward)   Start     Dose/Rate Route Frequency Ordered Stop   11/04/17 1400  ceFAZolin (ANCEF) IVPB 2g/100 mL premix     2 g 200 mL/hr over 30 Minutes Intravenous Every 6 hours 11/04/17 1141 11/05/17 0827   11/04/17 0615  ceFAZolin (ANCEF) 2-3 GM-%(50ML) IVPB SOLR    Note to Pharmacy:  Lorenza [place]   : cabinet override      11/04/17 0615 11/04/17 1829   11/04/17 0600  ceFAZolin (ANCEF) IVPB 2g/100 mL premix     2 g 200 mL/hr over 30 Minutes Intravenous On call to O.R. 11/03/17 2245 11/04/17 0755    .  He was fitted with AV 1 compression foot pump devices, instructed on heel pumps, early ambulation, and fitted with TED stockings bilaterally for DVT prophylaxis.  He  benefited maximally from the hospital stay and there were no complications.    Recent vital signs:  Vitals:   11/05/17 1714 11/06/17 0739  BP: (!) 146/69 138/66  Pulse: 67 62  Resp: 20 16  Temp: 97.7 F (36.5 C) 97.6 F (36.4 C)  SpO2: 98% 98%    Recent laboratory studies:  Lab Results  Component Value Date   HGB 13.5 10/27/2017   HGB 13.2 08/14/2017   HGB 13.2 02/11/2017   Lab Results  Component Value Date   WBC 24.0 (H) 10/27/2017   PLT 134 (L) 10/27/2017   Lab Results  Component Value Date   INR 1.11 11/04/2017   Lab Results  Component Value Date   NA 140 10/27/2017   K 3.6 10/27/2017   CL 109 10/27/2017   CO2 24 10/27/2017   BUN 18 10/27/2017   CREATININE 0.93 10/27/2017   GLUCOSE 106 (H) 10/27/2017    Discharge Medications:   Allergies as of 11/06/2017   No Known Allergies     Medication List    TAKE these medications   acetaminophen 500 MG  tablet Commonly known as:  TYLENOL Take 1,000 mg by mouth every 8 (eight) hours as needed for mild pain.   albuterol 108 (90 Base) MCG/ACT inhaler Commonly known as:  PROVENTIL HFA;VENTOLIN HFA Inhale 2 puffs into the lungs every 6 (six) hours as needed for wheezing or shortness of breath.   amiodarone 200 MG tablet Commonly known as:  PACERONE Take 100 mg by mouth 2 (two) times daily.   apixaban 5 MG Tabs tablet Commonly known as:  ELIQUIS Take 1 tablet (5 mg total) by mouth 2 (two) times daily.   atorvastatin 80 MG tablet Commonly known as:  LIPITOR Take 1 tablet (80 mg total) by mouth at bedtime.   budesonide-formoterol 160-4.5 MCG/ACT inhaler Commonly known as:  SYMBICORT Inhale 2 puffs into the lungs 2 (two) times daily.   cetirizine 10 MG tablet Commonly known as:  ZYRTEC Take 10 mg by mouth daily as needed for allergies.   CoQ10 200 MG Caps Take 200 mg by mouth daily.   ezetimibe 10 MG tablet Commonly known as:  ZETIA Take 1 tablet (10 mg total) by mouth daily.   Krill Oil 350 MG  Caps Take 350 mg by mouth every evening.   metoprolol tartrate 25 MG tablet Commonly known as:  LOPRESSOR Take 1 tablet (25 mg total) by mouth 2 (two) times daily as needed. What changed:  when to take this   mirtazapine 15 MG tablet Commonly known as:  REMERON Take 15 mg by mouth at bedtime as needed (for panic associated with PTSD).   multivitamin with minerals Tabs tablet Take 1 tablet by mouth daily.   nitroGLYCERIN 0.4 MG SL tablet Commonly known as:  NITROSTAT Place 1 tablet (0.4 mg total) under the tongue every 5 (five) minutes as needed for chest pain.   oxyCODONE 5 MG immediate release tablet Commonly known as:  Oxy IR/ROXICODONE Take 1 tablet (5 mg total) by mouth every 4 (four) hours as needed for moderate pain (pain score 4-6).   tiotropium 18 MCG inhalation capsule Commonly known as:  SPIRIVA Place 1 capsule (18 mcg total) into inhaler and inhale at bedtime.   traMADol 50 MG tablet Commonly known as:  ULTRAM Take 1 tablet (50 mg total) by mouth every 12 (twelve) hours as needed for moderate pain.            Durable Medical Equipment  (From admission, onward)        Start     Ordered   11/04/17 1142  DME Walker rolling  Once    Question:  Patient needs a walker to treat with the following condition  Answer:  S/P total hip arthroplasty   11/04/17 1141   11/04/17 1142  DME Bedside commode  Once    Question:  Patient needs a bedside commode to treat with the following condition  Answer:  S/P total hip arthroplasty   11/04/17 1141      Diagnostic Studies: Dg Hip Port Unilat With Pelvis 1v Left  Result Date: 11/04/2017 CLINICAL DATA:  Post left total hip replacement EXAM: DG HIP (WITH OR WITHOUT PELVIS) 1V PORT LEFT COMPARISON:  Pelvis film of 10/22/2016 FINDINGS: The acetabular and femoral components of the left total hip replacement are in good position. No complicating features are seen. The right total hip replacement is unchanged. The pelvic rami are  intact. IMPRESSION: New left total hip replacement components in good position. No complicating features. Electronically Signed   By: Ivar Drape M.D.   On:  11/04/2017 11:20    Disposition: Discharge disposition: 01-Home or Self Care       Discharge Instructions    Increase activity slowly   Complete by:  As directed       Follow-up Information    Hooten, Laurice Record, MD On 12/17/2017.   Specialty:  Orthopedic Surgery Why:  at 9:45am Contact information: [place] [address] [phone]            Signed: Watt Climes. 11/06/2017, 8:07 AM

## 2017-11-06 NOTE — Progress Notes (Signed)
Patient is being discharged to home with home health. DC & RX instructions given and patient acknowledged understanding. IV removed. NT will prepare patient for transport home.

## 2017-11-06 NOTE — Care Management Note (Signed)
Case Management Note  Patient Details  Name: [name] MRN: [id number] Date of Birth: [birth year]  Subjective/Objective:  Discharging today                 Action/Plan: [facility] notified of discharge. Eliquis.   Expected Discharge Date:  11/06/17               Expected Discharge Plan:  [place]  In-House Referral:     Discharge planning Services  CM Consult  Post Acute Care Choice:  Home Health Choice offered to:  Patient  DME Arranged:    DME Agency:     HH Arranged:  PT [place]:  [place]  Status of Service:  Completed, signed off  If discussed at [place] of Stay Meetings, dates discussed:    Additional Comments:  Jolly Mango, RN 11/06/2017, 9:32 AM

## 2017-11-06 NOTE — Progress Notes (Signed)
Physical Therapy Treatment Patient Details Name: [name] MRN: [id number] DOB: [birth year] Today's Date: 11/06/2017    History of Present Illness Pt is a 72 yo M diagnosed with degenerative arthrosis of the left hip and is s/p elective L THA with posterior approach.  PMH includes: R THA, COPD, sleep apnea with CPAP, HTN, CAD, MI, PVD, and A-fib with pacemaker placement.      PT Comments    Pt presents with mild deficits in strength, transfers, mobility, gait, balance, and activity tolerance and continues to progress well towards goals.  Pt able to amb 1 x 100' and 1 x 200' this session with step-through pattern and good stability.  Pt presented with good carry over with maintaining posterior hip precautions during functional mobility including avoiding closed kinetic chain L hip IR during sharp turns to the left and with correct sequencing during transfers.  Pt presented with fair to good stability during higher level static standing balance training.  Pt will benefit from HHPT services upon discharge to safely address above deficits for decreased caregiver assistance and eventual return to PLOF.     Follow Up Recommendations  Home health PT     Equipment Recommendations       Recommendations for Other Services       Precautions / Restrictions Precautions Precautions: Fall;Posterior Hip Precaution Booklet Issued: Yes (comment) Precaution Comments: Posterior hip precaution education and review provided Restrictions Weight Bearing Restrictions: Yes LLE Weight Bearing: Weight bearing as tolerated    Mobility  Bed Mobility Overal bed mobility: Modified Independent             General bed mobility comments: Extra time and effort during sit to sup but no physical assistance required   Transfers Overall transfer level: Needs assistance Equipment used: Rolling walker (2 wheeled) Transfers: Sit to/from Stand Sit to Stand: Supervision         General transfer comment:  Good sequencing with transfers   Ambulation/Gait Ambulation/Gait assistance: Supervision Ambulation Distance (Feet): 200 Feet Assistive device: Rolling walker (2 wheeled) Gait Pattern/deviations: Step-through pattern Gait velocity: decreased   General Gait Details: Fair cadence and good stability during amb with increased L hip pain with amb this session.    Stairs             Wheelchair Mobility    Modified Rankin (Stroke Patients Only)       Balance Overall balance assessment: Needs assistance Sitting-balance support: Feet supported;Bilateral upper extremity supported Sitting balance-Leahy Scale: Good     Standing balance support: Bilateral upper extremity supported Standing balance-Leahy Scale: Good                              Cognition Arousal/Alertness: Awake/alert Behavior During Therapy: WFL for tasks assessed/performed Overall Cognitive Status: Within Functional Limits for tasks assessed                                        Exercises Total Joint Exercises Ankle Circles/Pumps: AROM;Both;10 reps;5 reps Quad Sets: Strengthening;Both;5 reps;10 reps Gluteal Sets: Strengthening;Both;10 reps;5 reps Long Arc Quad: 10 reps;AROM;15 reps;Left Knee Flexion: AROM;10 reps;15 reps;Left Marching in Standing: AROM;Both;10 reps Other Exercises Other Exercises: Static standing balance training with feet apart, together, and semi-tandem with combinations of eyes open/closed and head still/head turns with fair to good stability throughout Other Exercises: 90 deg L turn  training during amb to prevent CKC L hip IR    General Comments        Pertinent Vitals/Pain Pain Assessment: 0-10 Pain Score: 6  Pain Location: L hip Pain Descriptors / Indicators: Sore;Aching Pain Intervention(s): Premedicated before session;Monitored during session;Patient requesting pain meds-RN notified    Home Living                      Prior  Function            PT Goals (current goals can now be found in the care plan section) Progress towards PT goals: Progressing toward goals    Frequency    BID      PT Plan Current plan remains appropriate    Co-evaluation              AM-PAC PT "6 Clicks" Daily Activity  Outcome Measure                   End of Session Equipment Utilized During Treatment: Gait belt Activity Tolerance: Patient tolerated treatment well Patient left: with SCD's reapplied;in chair;with chair alarm set;with call bell/phone within reach Nurse Communication: Mobility status PT Visit Diagnosis: Muscle weakness (generalized) (M62.81);Other abnormalities of gait and mobility (R26.89)     Time: 0240-9735 PT Time Calculation (min) (ACUTE ONLY): 30 min  Charges:  $Gait Training: 8-22 mins $Therapeutic Exercise: 8-22 mins                    G Codes:       DRoyetta Asal PT, DPT 11/06/17, 9:48 AM

## 2017-11-06 NOTE — Progress Notes (Signed)
Subjective: 2 Days Post-Op Procedure(s) (LRB): TOTAL HIP ARTHROPLASTY (Left) Patient reports pain as mild.   Patient is well, and has had no acute complaints or problems Patient did extremely well with physical therapy yesterday and met all goals to go home. Plan is to go Home after hospital stay. no nausea and no vomiting Patient denies any chest pains or shortness of breath. Objective: Vital signs in last 24 hours: Temp:  [97.6 F (36.4 C)-97.7 F (36.5 C)] 97.6 F (36.4 C) (04/19 0739) Pulse Rate:  [62-67] 62 (04/19 0739) Resp:  [16-20] 16 (04/19 0739) BP: (84-146)/(45-69) 138/66 (04/19 0739) SpO2:  [98 %] 98 % (04/19 0739) well approximated incision Heels are non tender and elevated off the bed using rolled towels Intake/Output from previous day: 04/18 0701 - 04/19 0700 In: 840 [P.O.:840] Out: 950 [Urine:725; Drains:225] Intake/Output this shift: No intake/output data recorded.  No results for input(s): HGB in the last 72 hours. No results for input(s): WBC, RBC, HCT, PLT in the last 72 hours. No results for input(s): NA, K, CL, CO2, BUN, CREATININE, GLUCOSE, CALCIUM in the last 72 hours. Recent Labs    11/04/17 0658  INR 1.11    EXAM General - Patient is Alert, Appropriate and Oriented Extremity - Neurologically intact Neurovascular intact Sensation intact distally Intact pulses distally Dorsiflexion/Plantar flexion intact No cellulitis present Compartment soft Dressing - scant drainage Motor Function - intact, moving foot and toes well on exam.    Past Medical History:  Diagnosis Date  . Anxiety   . Arthritis   . Atrial fibrillation ([place])    a. Dx 2013, recurred 02/2014, CHA2DS2VASc = 3 -->placed on Eliquis;  b. 02/2014 Echo: EF 50-55%, mid and apical anterior septum and mid and apical inf septum are abnl, mild to mod Ao sclerosis w/o AS.  Marland Kitchen Chicken pox   . Chronic lymphocytic leukemia ([place])    a. Dx 02/2014.  Marland Kitchen Complication of anesthesia    History  of  PTSD--do not touch patient when waking up from surgery.  Marland Kitchen COPD (chronic obstructive pulmonary disease) ([place])   . Coronary artery disease    a. 04/2009 CABG x 3 (LIMA->LAD, VG->OM1, VG->PDA);  b. 09/2009 Cath: occluded VG x 2 w/ patent LIMA and L->R collats. EF 55%, mild antlat HK;  c. 10/2011 MV: EF 53%, no isch/infarct-->low risk.  Marland Kitchen Dysrhythmia    hx of a-fib  . GERD (gastroesophageal reflux disease)    occasional  . History of chemotherapy 2015-2016  . HOH (hard of hearing)    Bilateral Hearing Aids  . Hypertension   . Myocardial infarction ([place]) 2010  . OSA on CPAP    USE C-PAP  . Presence of permanent cardiac pacemaker 2017  . PTSD (post-traumatic stress disorder)   . PTSD (post-traumatic stress disorder)   . Pure hypercholesterolemia   . Rheumatic fever 1959    Assessment/Plan: 2 Days Post-Op Procedure(s) (LRB): TOTAL HIP ARTHROPLASTY (Left) Active Problems:   Status post total replacement of hip  Estimated body mass index is 32.93 kg/m as calculated from the following:   Height as of this encounter: _0  (1.753 m).   Weight as of this encounter: 101.2 kg (223 lb). Up with therapy Discharge home with home health  Labs: None DVT Prophylaxis - Foot Pumps, TED hose and Eliquis Weight-Bearing as tolerated to left leg Hemovac was discontinued today.  Ends of the drain appeared to be intact. Please change dressing prior to patient being discharged and give the  patient 2 extra honeycomb dressings to take home.  Jillyn Ledger. [facility] [place] 11/06/2017, 7:57 AM

## 2017-11-07 DIAGNOSIS — I5032 Chronic diastolic (congestive) heart failure: Secondary | ICD-10-CM | POA: Diagnosis not present

## 2017-11-07 DIAGNOSIS — Z471 Aftercare following joint replacement surgery: Secondary | ICD-10-CM | POA: Diagnosis not present

## 2017-11-07 DIAGNOSIS — I495 Sick sinus syndrome: Secondary | ICD-10-CM | POA: Diagnosis not present

## 2017-11-07 DIAGNOSIS — I2510 Atherosclerotic heart disease of native coronary artery without angina pectoris: Secondary | ICD-10-CM | POA: Diagnosis not present

## 2017-11-07 DIAGNOSIS — Z96643 Presence of artificial hip joint, bilateral: Secondary | ICD-10-CM | POA: Diagnosis not present

## 2017-11-07 DIAGNOSIS — I480 Paroxysmal atrial fibrillation: Secondary | ICD-10-CM | POA: Diagnosis not present

## 2017-11-07 DIAGNOSIS — I252 Old myocardial infarction: Secondary | ICD-10-CM | POA: Diagnosis not present

## 2017-11-07 DIAGNOSIS — I110 Hypertensive heart disease with heart failure: Secondary | ICD-10-CM | POA: Diagnosis not present

## 2017-11-07 DIAGNOSIS — C9110 Chronic lymphocytic leukemia of B-cell type not having achieved remission: Secondary | ICD-10-CM | POA: Diagnosis not present

## 2017-11-07 DIAGNOSIS — I739 Peripheral vascular disease, unspecified: Secondary | ICD-10-CM | POA: Diagnosis not present

## 2017-11-09 DIAGNOSIS — I5032 Chronic diastolic (congestive) heart failure: Secondary | ICD-10-CM | POA: Diagnosis not present

## 2017-11-09 DIAGNOSIS — I252 Old myocardial infarction: Secondary | ICD-10-CM | POA: Diagnosis not present

## 2017-11-09 DIAGNOSIS — I739 Peripheral vascular disease, unspecified: Secondary | ICD-10-CM | POA: Diagnosis not present

## 2017-11-09 DIAGNOSIS — Z471 Aftercare following joint replacement surgery: Secondary | ICD-10-CM | POA: Diagnosis not present

## 2017-11-09 DIAGNOSIS — I110 Hypertensive heart disease with heart failure: Secondary | ICD-10-CM | POA: Diagnosis not present

## 2017-11-09 DIAGNOSIS — I480 Paroxysmal atrial fibrillation: Secondary | ICD-10-CM | POA: Diagnosis not present

## 2017-11-09 DIAGNOSIS — I2510 Atherosclerotic heart disease of native coronary artery without angina pectoris: Secondary | ICD-10-CM | POA: Diagnosis not present

## 2017-11-09 DIAGNOSIS — I495 Sick sinus syndrome: Secondary | ICD-10-CM | POA: Diagnosis not present

## 2017-11-09 DIAGNOSIS — Z96643 Presence of artificial hip joint, bilateral: Secondary | ICD-10-CM | POA: Diagnosis not present

## 2017-11-09 DIAGNOSIS — C9110 Chronic lymphocytic leukemia of B-cell type not having achieved remission: Secondary | ICD-10-CM | POA: Diagnosis not present

## 2017-11-09 LAB — SURGICAL PATHOLOGY

## 2017-11-10 ENCOUNTER — Telehealth: Payer: Self-pay | Admitting: Family Medicine

## 2017-11-10 DIAGNOSIS — C9110 Chronic lymphocytic leukemia of B-cell type not having achieved remission: Secondary | ICD-10-CM

## 2017-11-10 NOTE — Telephone Encounter (Signed)
Please contact the patient and let him know I reviewed results of his recent preop lab work from prior to his recnet surgery and it looks like his WBC has been trending up. He follows with oncology for CLL and I would like to have him follow back up with them given this increase. We could arrange this appointment for him. Thanks.

## 2017-11-11 DIAGNOSIS — I252 Old myocardial infarction: Secondary | ICD-10-CM | POA: Diagnosis not present

## 2017-11-11 DIAGNOSIS — I110 Hypertensive heart disease with heart failure: Secondary | ICD-10-CM | POA: Diagnosis not present

## 2017-11-11 DIAGNOSIS — I739 Peripheral vascular disease, unspecified: Secondary | ICD-10-CM | POA: Diagnosis not present

## 2017-11-11 DIAGNOSIS — I5032 Chronic diastolic (congestive) heart failure: Secondary | ICD-10-CM | POA: Diagnosis not present

## 2017-11-11 DIAGNOSIS — Z96643 Presence of artificial hip joint, bilateral: Secondary | ICD-10-CM | POA: Diagnosis not present

## 2017-11-11 DIAGNOSIS — I480 Paroxysmal atrial fibrillation: Secondary | ICD-10-CM | POA: Diagnosis not present

## 2017-11-11 DIAGNOSIS — I2510 Atherosclerotic heart disease of native coronary artery without angina pectoris: Secondary | ICD-10-CM | POA: Diagnosis not present

## 2017-11-11 DIAGNOSIS — Z471 Aftercare following joint replacement surgery: Secondary | ICD-10-CM | POA: Diagnosis not present

## 2017-11-11 DIAGNOSIS — I495 Sick sinus syndrome: Secondary | ICD-10-CM | POA: Diagnosis not present

## 2017-11-11 DIAGNOSIS — C9110 Chronic lymphocytic leukemia of B-cell type not having achieved remission: Secondary | ICD-10-CM | POA: Diagnosis not present

## 2017-11-11 NOTE — Telephone Encounter (Signed)
fyi

## 2017-11-11 NOTE — Telephone Encounter (Signed)
I have placed a referral back to his oncologist. I will forward to Melissa to get him referred back to the cancer center.

## 2017-11-11 NOTE — Telephone Encounter (Signed)
Left message to return call, ok for pec to speak to patient about message below 

## 2017-11-11 NOTE — Telephone Encounter (Signed)
Pt. Given message from Dr. Caryl Bis. States he would like it if Dr. Caryl Bis would make that appointment for him with his oncology doctor.

## 2017-11-11 NOTE — Addendum Note (Signed)
Addended by: Caryl Bis, Cortney Mckinney G on: 11/11/2017 12:05 PM   Modules accepted: Orders

## 2017-11-13 DIAGNOSIS — Z96643 Presence of artificial hip joint, bilateral: Secondary | ICD-10-CM | POA: Diagnosis not present

## 2017-11-13 DIAGNOSIS — I5032 Chronic diastolic (congestive) heart failure: Secondary | ICD-10-CM | POA: Diagnosis not present

## 2017-11-13 DIAGNOSIS — Z471 Aftercare following joint replacement surgery: Secondary | ICD-10-CM | POA: Diagnosis not present

## 2017-11-13 DIAGNOSIS — I495 Sick sinus syndrome: Secondary | ICD-10-CM | POA: Diagnosis not present

## 2017-11-13 DIAGNOSIS — C9110 Chronic lymphocytic leukemia of B-cell type not having achieved remission: Secondary | ICD-10-CM | POA: Diagnosis not present

## 2017-11-13 DIAGNOSIS — I252 Old myocardial infarction: Secondary | ICD-10-CM | POA: Diagnosis not present

## 2017-11-13 DIAGNOSIS — I110 Hypertensive heart disease with heart failure: Secondary | ICD-10-CM | POA: Diagnosis not present

## 2017-11-13 DIAGNOSIS — I739 Peripheral vascular disease, unspecified: Secondary | ICD-10-CM | POA: Diagnosis not present

## 2017-11-13 DIAGNOSIS — I2510 Atherosclerotic heart disease of native coronary artery without angina pectoris: Secondary | ICD-10-CM | POA: Diagnosis not present

## 2017-11-13 DIAGNOSIS — I480 Paroxysmal atrial fibrillation: Secondary | ICD-10-CM | POA: Diagnosis not present

## 2017-11-16 DIAGNOSIS — I495 Sick sinus syndrome: Secondary | ICD-10-CM | POA: Diagnosis not present

## 2017-11-16 DIAGNOSIS — I739 Peripheral vascular disease, unspecified: Secondary | ICD-10-CM | POA: Diagnosis not present

## 2017-11-16 DIAGNOSIS — Z96643 Presence of artificial hip joint, bilateral: Secondary | ICD-10-CM | POA: Diagnosis not present

## 2017-11-16 DIAGNOSIS — I480 Paroxysmal atrial fibrillation: Secondary | ICD-10-CM | POA: Diagnosis not present

## 2017-11-16 DIAGNOSIS — I2510 Atherosclerotic heart disease of native coronary artery without angina pectoris: Secondary | ICD-10-CM | POA: Diagnosis not present

## 2017-11-16 DIAGNOSIS — I252 Old myocardial infarction: Secondary | ICD-10-CM | POA: Diagnosis not present

## 2017-11-16 DIAGNOSIS — I110 Hypertensive heart disease with heart failure: Secondary | ICD-10-CM | POA: Diagnosis not present

## 2017-11-16 DIAGNOSIS — I5032 Chronic diastolic (congestive) heart failure: Secondary | ICD-10-CM | POA: Diagnosis not present

## 2017-11-16 DIAGNOSIS — Z471 Aftercare following joint replacement surgery: Secondary | ICD-10-CM | POA: Diagnosis not present

## 2017-11-16 DIAGNOSIS — C9110 Chronic lymphocytic leukemia of B-cell type not having achieved remission: Secondary | ICD-10-CM | POA: Diagnosis not present

## 2017-11-17 ENCOUNTER — Ambulatory Visit (INDEPENDENT_AMBULATORY_CARE_PROVIDER_SITE_OTHER): Payer: Medicare HMO | Admitting: *Deleted

## 2017-11-17 DIAGNOSIS — I495 Sick sinus syndrome: Secondary | ICD-10-CM | POA: Diagnosis not present

## 2017-11-17 NOTE — Progress Notes (Signed)
Remote pacemaker transmission.   

## 2017-11-18 ENCOUNTER — Encounter: Payer: Self-pay | Admitting: Cardiology

## 2017-11-18 DIAGNOSIS — Z96643 Presence of artificial hip joint, bilateral: Secondary | ICD-10-CM | POA: Diagnosis not present

## 2017-11-18 DIAGNOSIS — I110 Hypertensive heart disease with heart failure: Secondary | ICD-10-CM | POA: Diagnosis not present

## 2017-11-18 DIAGNOSIS — C9110 Chronic lymphocytic leukemia of B-cell type not having achieved remission: Secondary | ICD-10-CM | POA: Diagnosis not present

## 2017-11-18 DIAGNOSIS — I495 Sick sinus syndrome: Secondary | ICD-10-CM | POA: Diagnosis not present

## 2017-11-18 DIAGNOSIS — I2510 Atherosclerotic heart disease of native coronary artery without angina pectoris: Secondary | ICD-10-CM | POA: Diagnosis not present

## 2017-11-18 DIAGNOSIS — I480 Paroxysmal atrial fibrillation: Secondary | ICD-10-CM | POA: Diagnosis not present

## 2017-11-18 DIAGNOSIS — I252 Old myocardial infarction: Secondary | ICD-10-CM | POA: Diagnosis not present

## 2017-11-18 DIAGNOSIS — I5032 Chronic diastolic (congestive) heart failure: Secondary | ICD-10-CM | POA: Diagnosis not present

## 2017-11-18 DIAGNOSIS — I739 Peripheral vascular disease, unspecified: Secondary | ICD-10-CM | POA: Diagnosis not present

## 2017-11-18 DIAGNOSIS — Z471 Aftercare following joint replacement surgery: Secondary | ICD-10-CM | POA: Diagnosis not present

## 2017-11-18 LAB — CUP PACEART REMOTE DEVICE CHECK
Battery Remaining Longevity: 96 mo
Battery Voltage: 3.01 V
Brady Statistic AP VS Percent: 95.29 %
Brady Statistic AS VS Percent: 4.57 %
Implantable Lead Implant Date: 20170814
Implantable Lead Model: 5076
Implantable Pulse Generator Implant Date: 20170814
Lead Channel Impedance Value: 323 Ohm
Lead Channel Impedance Value: 456 Ohm
Lead Channel Pacing Threshold Amplitude: 0.875 V
Lead Channel Pacing Threshold Amplitude: 1 V
Lead Channel Pacing Threshold Pulse Width: 0.4 ms
Lead Channel Sensing Intrinsic Amplitude: 20.625 mV
Lead Channel Sensing Intrinsic Amplitude: 3.125 mV
Lead Channel Sensing Intrinsic Amplitude: 3.125 mV
Lead Channel Setting Sensing Sensitivity: 0.9 mV
MDC IDC LEAD IMPLANT DT: 20170814
MDC IDC LEAD LOCATION: 753859
MDC IDC LEAD LOCATION: 753860
MDC IDC MSMT LEADCHNL RA PACING THRESHOLD PULSEWIDTH: 0.4 ms
MDC IDC MSMT LEADCHNL RV IMPEDANCE VALUE: 456 Ohm
MDC IDC MSMT LEADCHNL RV IMPEDANCE VALUE: 551 Ohm
MDC IDC MSMT LEADCHNL RV SENSING INTR AMPL: 20.625 mV
MDC IDC SESS DTM: 20190430125652
MDC IDC SET LEADCHNL RA PACING AMPLITUDE: 2 V
MDC IDC SET LEADCHNL RV PACING AMPLITUDE: 2.5 V
MDC IDC SET LEADCHNL RV PACING PULSEWIDTH: 0.4 ms
MDC IDC STAT BRADY AP VP PERCENT: 0.14 %
MDC IDC STAT BRADY AS VP PERCENT: 0 %
MDC IDC STAT BRADY RA PERCENT PACED: 95.42 %
MDC IDC STAT BRADY RV PERCENT PACED: 0.14 %

## 2017-11-19 ENCOUNTER — Encounter: Payer: Self-pay | Admitting: Internal Medicine

## 2017-11-19 ENCOUNTER — Other Ambulatory Visit: Payer: Self-pay

## 2017-11-19 ENCOUNTER — Inpatient Hospital Stay: Payer: Non-veteran care | Attending: Internal Medicine

## 2017-11-19 ENCOUNTER — Inpatient Hospital Stay (HOSPITAL_BASED_OUTPATIENT_CLINIC_OR_DEPARTMENT_OTHER): Payer: Non-veteran care | Admitting: Internal Medicine

## 2017-11-19 VITALS — BP 128/64 | HR 63 | Temp 99.2°F | Resp 16 | Wt 220.9 lb

## 2017-11-19 DIAGNOSIS — C9110 Chronic lymphocytic leukemia of B-cell type not having achieved remission: Secondary | ICD-10-CM

## 2017-11-19 DIAGNOSIS — Z87891 Personal history of nicotine dependence: Secondary | ICD-10-CM | POA: Insufficient documentation

## 2017-11-19 LAB — CBC WITH DIFFERENTIAL/PLATELET
Basophils Absolute: 0.1 10*3/uL (ref 0–0.1)
Basophils Relative: 0 %
EOS ABS: 0.5 10*3/uL (ref 0–0.7)
Eosinophils Relative: 2 %
HCT: 33.3 % — ABNORMAL LOW (ref 40.0–52.0)
HEMOGLOBIN: 11.3 g/dL — AB (ref 13.0–18.0)
LYMPHS ABS: 22.5 10*3/uL — AB (ref 1.0–3.6)
Lymphocytes Relative: 79 %
MCH: 33.2 pg (ref 26.0–34.0)
MCHC: 34 g/dL (ref 32.0–36.0)
MCV: 97.7 fL (ref 80.0–100.0)
Monocytes Absolute: 0.5 10*3/uL (ref 0.2–1.0)
Monocytes Relative: 2 %
NEUTROS PCT: 17 %
Neutro Abs: 4.8 10*3/uL (ref 1.4–6.5)
Platelets: 324 10*3/uL (ref 150–440)
RBC: 3.4 MIL/uL — AB (ref 4.40–5.90)
RDW: 15.3 % — ABNORMAL HIGH (ref 11.5–14.5)
WBC: 28.4 10*3/uL — AB (ref 3.8–10.6)

## 2017-11-19 LAB — COMPREHENSIVE METABOLIC PANEL
ALT: 30 U/L (ref 17–63)
AST: 24 U/L (ref 15–41)
Albumin: 3.6 g/dL (ref 3.5–5.0)
Alkaline Phosphatase: 111 U/L (ref 38–126)
Anion gap: 9 (ref 5–15)
BUN: 18 mg/dL (ref 6–20)
CHLORIDE: 103 mmol/L (ref 101–111)
CO2: 25 mmol/L (ref 22–32)
CREATININE: 0.97 mg/dL (ref 0.61–1.24)
Calcium: 8.9 mg/dL (ref 8.9–10.3)
Glucose, Bld: 118 mg/dL — ABNORMAL HIGH (ref 65–99)
POTASSIUM: 4.3 mmol/L (ref 3.5–5.1)
Sodium: 137 mmol/L (ref 135–145)
Total Bilirubin: 0.9 mg/dL (ref 0.3–1.2)
Total Protein: 6.8 g/dL (ref 6.5–8.1)

## 2017-11-19 LAB — LACTATE DEHYDROGENASE: LDH: 168 U/L (ref 98–192)

## 2017-11-19 NOTE — Assessment & Plan Note (Addendum)
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   #Patient is fairly asymptomatic from his underlying recurrent CLL.  Long discussion with the patient and family regarding the benefit in treating CLL sooner than later as long as they are asymptomatic.  I would recommend continued surveillance.   #I discussed multiple options including ibrutinib pills/Gazyva infusions and others if and when he needs any treatments.  I ordered fish panel on his blood today.  Will check [facility] at next visit.  Discussed that I would not recommend any CT scans at this time. No further work-up is recommended at this time.  #Status post left hip replacement; bone biopsy pathology shows involvement of CLL; expected finding.  Discussed with Dr.Onley.    # Recommend follow-up in 3 months/CBC CMP and LDH.

## 2017-11-19 NOTE — Progress Notes (Signed)
[facility] OFFICE PROGRESS NOTE  Patient Care Team: Leone Haven, MD as PCP - General (Family Medicine) Rockey Situ Kathlene November, MD as Consulting Physician (Cardiology)   SUMMARY OF ONCOLOGIC HISTORY:  Oncology History   # AUG 2015- SLL/CLL [facility] Bx] s/p Benda-Rituxan x6 [finished March 2016]; Maintenance Rituxan q 35m[started April 2016; Dr.Pandit];Last Ritux Jan 2017.  MARCH 2017- CT N/C/A/P- NED. STOP Ritux; surveillance    # s/p PPM [Dr.Klein; Sep 2017]; A.fib [on eliquis]     CLL (chronic lymphocytic leukemia) (HCC)    Initial Diagnosis    CLL (chronic lymphocytic leukemia) (HCC)        INTERVAL HISTORY:  A pleasant 72year old male patient with above history of SLL/CLL currently on surveillance is here for follow-up.   The interim patient had left hip replacement but was uneventful.  However it was noted to have involvement by CLL of his bone biopsy/surgery.  Patient's appetite is improving.  Denies any weight loss. Denies any profuse night sweats.  Denies any frequent infections. Denies any chest pain or cough.   REVIEW OF SYSTEMS:  A complete 10 point review of system is done which is negative except mentioned above/history of present illness.   PAST MEDICAL HISTORY :  Past Medical History:  Diagnosis Date  . Anxiety   . Arthritis   . [place]    a. Dx 2013, recurred 02/2014, CHA2DS2VASc = 3 -->placed on Eliquis;  b. 02/2014 Echo: EF 50-55%, mid and apical anterior septum and mid and apical inf septum are abnl, mild to mod Ao sclerosis w/o AS.  .Marland KitchenChicken pox   . [place]    a. Dx 02/2014.  .Marland KitchenComplication of anesthesia    History of  PTSD--do not touch patient when waking up from surgery.  .Marland KitchenCOPD (chronic obstructive pulmonary disease) ([place]   . Coronary artery disease    a. 04/2009 CABG x 3 (LIMA->LAD, VG->OM1, VG->PDA);  b. 09/2009 Cath: occluded VG x 2 w/ patent LIMA and L->R collats. EF 55%,  mild antlat HK;  c. 10/2011 MV: EF 53%, no isch/infarct-->low risk.  .Marland KitchenDysrhythmia    hx of a-fib  . GERD (gastroesophageal reflux disease)    occasional  . History of chemotherapy 2015-2016  . HOH (hard of hearing)    Bilateral Hearing Aids  . Hypertension   . [place]  . OSA on CPAP    USE C-PAP  . Presence of permanent cardiac pacemaker 2017  . PTSD (post-traumatic stress disorder)   . PTSD (post-traumatic stress disorder)   . Pure hypercholesterolemia   . Rheumatic fever 1959    PAST SURGICAL HISTORY :   Past Surgical History:  Procedure Laterality Date  . ABDOMINAL HERNIA REPAIR    . APPENDECTOMY  06/21/1985  . CARDIAC CATHETERIZATION  2010; 2011   ; Dr AFletcher Anon . CORONARY ARTERY BYPASS GRAFT  04/2009   "CABG X3"  . EP IMPLANTABLE DEVICE N/A 03/03/2016   Procedure: Pacemaker Implant;  Surgeon: SDeboraha Sprang MD;  [facility];  Service: Cardiovascular;  Laterality: N/A;  . FOREIGN BODY REMOVAL  1968   "shrapnel in my tailbone"  . INGUINAL HERNIA REPAIR Right   . INSERT / REPLACE / REMOVE PACEMAKER    . JOINT REPLACEMENT Right 2018  . LAPAROSCOPIC CHOLECYSTECTOMY    . TONSILLECTOMY AND ADENOIDECTOMY  1956  . TOTAL HIP ARTHROPLASTY Right 10/22/2016   Procedure: TOTAL HIP  ARTHROPLASTY;  Surgeon: Dereck Leep, MD;  Location: [facility];  Service: Orthopedics;  Laterality: Right;  . TOTAL HIP ARTHROPLASTY Left 11/04/2017   Procedure: TOTAL HIP ARTHROPLASTY;  Surgeon: Dereck Leep, MD;  Location: [facility];  Service: Orthopedics;  Laterality: Left;    FAMILY HISTORY :   Family History  Problem Relation Age of Onset  . Heart disease Mother   . Coronary artery disease Unknown        family history    SOCIAL HISTORY:   Social History   Tobacco Use  . Smoking status: Former Smoker    Packs/day: 1.00    Years: 40.00    Pack years: 40.00    Types: Cigarettes    Last attempt to quit: 07/21/2006    Years since quitting: 11.3  .  Smokeless tobacco: Never Used  Substance Use Topics  . Alcohol use: Yes    Alcohol/week: 0.5 oz    Types: 1 Standard drinks or equivalent per week    Comment: rarely  . Drug use: No    ALLERGIES:  has No Known Allergies.  MEDICATIONS:  Current Outpatient Medications  Medication Sig Dispense Refill  . acetaminophen (TYLENOL) 500 MG tablet Take 1,000 mg by mouth every 8 (eight) hours as needed for mild pain.    Marland Kitchen albuterol (PROVENTIL HFA;VENTOLIN HFA) 108 (90 Base) MCG/ACT inhaler Inhale 2 puffs into the lungs every 6 (six) hours as needed for wheezing or shortness of breath. 1 Inhaler 11  . amiodarone (PACERONE) 200 MG tablet Take 100 mg by mouth 2 (two) times daily.    Marland Kitchen apixaban (ELIQUIS) 5 MG TABS tablet Take 1 tablet (5 mg total) by mouth 2 (two) times daily. 180 tablet 3  . atorvastatin (LIPITOR) 80 MG tablet Take 1 tablet (80 mg total) by mouth at bedtime. 90 tablet 3  . budesonide-formoterol (SYMBICORT) 160-4.5 MCG/ACT inhaler Inhale 2 puffs into the lungs 2 (two) times daily. 1 Inhaler 11  . cetirizine (ZYRTEC) 10 MG tablet Take 10 mg by mouth daily as needed for allergies.     . Coenzyme Q10 (COQ10) 200 MG CAPS Take 200 mg by mouth daily.    Marland Kitchen ezetimibe (ZETIA) 10 MG tablet Take 1 tablet (10 mg total) by mouth daily. 90 tablet 3  . Krill Oil 350 MG CAPS Take 350 mg by mouth every evening.    . metoprolol tartrate (LOPRESSOR) 25 MG tablet Take 1 tablet (25 mg total) by mouth 2 (two) times daily as needed. (Patient taking differently: Take 25 mg by mouth 2 (two) times daily. ) 180 tablet 3  . mirtazapine (REMERON) 15 MG tablet Take 15 mg by mouth at bedtime as needed (for panic associated with PTSD).     . Multiple Vitamin (MULTIVITAMIN WITH MINERALS) TABS tablet Take 1 tablet by mouth daily.    . nitroGLYCERIN (NITROSTAT) 0.4 MG SL tablet Place 1 tablet (0.4 mg total) under the tongue every 5 (five) minutes as needed for chest pain. 25 tablet 6  . oxyCODONE (OXY IR/ROXICODONE) 5 MG  immediate release tablet Take 1 tablet (5 mg total) by mouth every 4 (four) hours as needed for moderate pain (pain score 4-6). 30 tablet 0  . tiotropium (SPIRIVA) 18 MCG inhalation capsule Place 1 capsule (18 mcg total) into inhaler and inhale at bedtime. 30 capsule 11  . traMADol (ULTRAM) 50 MG tablet Take 1 tablet (50 mg total) by mouth every 12 (twelve) hours as needed for moderate pain. 90 tablet 0  No current facility-administered medications for this visit.     PHYSICAL EXAMINATION: ECOG PERFORMANCE STATUS: 0 - Asymptomatic  BP 128/64 (BP Location: Left Arm, Patient Position: Sitting)   Pulse 63   Temp 99.2 F (37.3 C) (Tympanic)   Resp 16   Wt 220 lb 14.4 oz (100.2 kg)   BMI 32.62 kg/m   Filed Weights   11/19/17 1210 11/19/17 1215  Weight: 220 lb 14.4 oz (100.2 kg) 220 lb 14.4 oz (100.2 kg)    GENERAL: Well-nourished well-developed; Alert, no distress and comfortable. He is with his wife/daughter. Walks with a cane.   EYES: no pallor or icterus OROPHARYNX: no thrush or ulceration; dentures.  NECK: supple, no masses felt LYMPH:  no palpable lymphadenopathy in the cervical, or inguinal regions.  Bilateral small 1 to 2 cm lymph nodes noted in the axilla. LUNGS: clear to auscultation and  No wheeze or crackles HEART/CVS: regular rate & rhythm and no murmurs; No lower extremity edema ABDOMEN:abdomen soft, non-tender and normal bowel sounds Musculoskeletal:no cyanosis of digits and no clubbing  PSYCH: alert & oriented x 3 with fluent speech NEURO: no focal motor/sensory deficits SKIN:  no rashes or significant lesions  LABORATORY DATA:  I have reviewed the data as listed    Component Value Date/Time   NA 137 11/19/2017 1141   NA 139 10/11/2014 1800   K 4.3 11/19/2017 1141   K 3.3 (L) 10/11/2014 1800   CL 103 11/19/2017 1141   CL 106 10/11/2014 1800   CO2 25 11/19/2017 1141   CO2 27 10/11/2014 1800   GLUCOSE 118 (H) 11/19/2017 1141   GLUCOSE 107 (H) 10/11/2014 1800    BUN 18 11/19/2017 1141   BUN 15 10/11/2014 1800   CREATININE 0.97 11/19/2017 1141   CREATININE 0.89 10/11/2014 1800   CALCIUM 8.9 11/19/2017 1141   CALCIUM 8.8 (L) 10/11/2014 1800   PROT 6.8 11/19/2017 1141   PROT 6.7 05/18/2017 1048   PROT 6.4 (L) 10/11/2014 1800   ALBUMIN 3.6 11/19/2017 1141   ALBUMIN 4.3 05/18/2017 1048   ALBUMIN 4.1 10/11/2014 1800   AST 24 11/19/2017 1141   AST 23 10/11/2014 1800   ALT 30 11/19/2017 1141   ALT 22 10/11/2014 1800   ALKPHOS 111 11/19/2017 1141   ALKPHOS 61 10/11/2014 1800   BILITOT 0.9 11/19/2017 1141   BILITOT 0.7 05/18/2017 1048   BILITOT 0.9 10/11/2014 1800   GFRNONAA >60 11/19/2017 1141   GFRNONAA >60 10/11/2014 1800   GFRAA >60 11/19/2017 1141   GFRAA >60 10/11/2014 1800    No results found for: SPEP, UPEP  Lab Results  Component Value Date   WBC 28.4 (H) 11/19/2017   NEUTROABS 4.8 11/19/2017   HGB 11.3 (L) 11/19/2017   HCT 33.3 (L) 11/19/2017   MCV 97.7 11/19/2017   PLT 324 11/19/2017      Chemistry      Component Value Date/Time   NA 137 11/19/2017 1141   NA 139 10/11/2014 1800   K 4.3 11/19/2017 1141   K 3.3 (L) 10/11/2014 1800   CL 103 11/19/2017 1141   CL 106 10/11/2014 1800   CO2 25 11/19/2017 1141   CO2 27 10/11/2014 1800   BUN 18 11/19/2017 1141   BUN 15 10/11/2014 1800   CREATININE 0.97 11/19/2017 1141   CREATININE 0.89 10/11/2014 1800      Component Value Date/Time   CALCIUM 8.9 11/19/2017 1141   CALCIUM 8.8 (L) 10/11/2014 1800   ALKPHOS 111 11/19/2017 1141  ALKPHOS 61 10/11/2014 1800   AST 24 11/19/2017 1141   AST 23 10/11/2014 1800   ALT 30 11/19/2017 1141   ALT 22 10/11/2014 1800   BILITOT 0.9 11/19/2017 1141   BILITOT 0.7 05/18/2017 1048   BILITOT 0.9 10/11/2014 1800       ASSESSMENT & PLAN:   CLL (chronic lymphocytic leukemia) (HCC) # 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   #Patient is fairly asymptomatic from his underlying recurrent CLL.  Long discussion with the patient and family regarding the benefit in treating CLL sooner than later as long as they are asymptomatic.  I would recommend continued surveillance.   #I discussed multiple options including ibrutinib pills/Gazyva infusions and others if and when he needs any treatments.  I ordered fish panel on his blood today.  Will check [facility] at next visit.  Discussed that I would not recommend any CT scans at this time. No further work-up is recommended at this time.  #Status post left hip replacement; bone biopsy pathology shows involvement of CLL; expected finding.  Discussed with Dr.Onley.    # Recommend follow-up in 3 months/CBC CMP and LDH.     Cammie Sickle, MD 11/19/2017 1:41 PM

## 2017-11-20 DIAGNOSIS — I110 Hypertensive heart disease with heart failure: Secondary | ICD-10-CM | POA: Diagnosis not present

## 2017-11-20 DIAGNOSIS — I495 Sick sinus syndrome: Secondary | ICD-10-CM | POA: Diagnosis not present

## 2017-11-20 DIAGNOSIS — Z96643 Presence of artificial hip joint, bilateral: Secondary | ICD-10-CM | POA: Diagnosis not present

## 2017-11-20 DIAGNOSIS — I252 Old myocardial infarction: Secondary | ICD-10-CM | POA: Diagnosis not present

## 2017-11-20 DIAGNOSIS — I5032 Chronic diastolic (congestive) heart failure: Secondary | ICD-10-CM | POA: Diagnosis not present

## 2017-11-20 DIAGNOSIS — I739 Peripheral vascular disease, unspecified: Secondary | ICD-10-CM | POA: Diagnosis not present

## 2017-11-20 DIAGNOSIS — Z471 Aftercare following joint replacement surgery: Secondary | ICD-10-CM | POA: Diagnosis not present

## 2017-11-20 DIAGNOSIS — I2510 Atherosclerotic heart disease of native coronary artery without angina pectoris: Secondary | ICD-10-CM | POA: Diagnosis not present

## 2017-11-20 DIAGNOSIS — C9110 Chronic lymphocytic leukemia of B-cell type not having achieved remission: Secondary | ICD-10-CM | POA: Diagnosis not present

## 2017-11-20 DIAGNOSIS — I480 Paroxysmal atrial fibrillation: Secondary | ICD-10-CM | POA: Diagnosis not present

## 2017-12-03 LAB — FISH HES LEUKEMIA, 4Q12 REA

## 2017-12-16 ENCOUNTER — Other Ambulatory Visit: Payer: Self-pay | Admitting: Internal Medicine

## 2017-12-16 DIAGNOSIS — C9110 Chronic lymphocytic leukemia of B-cell type not having achieved remission: Secondary | ICD-10-CM

## 2017-12-17 ENCOUNTER — Other Ambulatory Visit: Payer: Self-pay

## 2017-12-17 DIAGNOSIS — Z96642 Presence of left artificial hip joint: Secondary | ICD-10-CM | POA: Diagnosis not present

## 2017-12-17 MED ORDER — AMIODARONE HCL 200 MG PO TABS: 100.0000 mg | ORAL_TABLET | Freq: Two times a day (BID) | ORAL | 3 refills | Status: DC

## 2017-12-25 ENCOUNTER — Encounter: Payer: Self-pay | Admitting: Family Medicine

## 2017-12-25 ENCOUNTER — Ambulatory Visit (INDEPENDENT_AMBULATORY_CARE_PROVIDER_SITE_OTHER): Payer: Medicare HMO | Admitting: Family Medicine

## 2017-12-25 VITALS — BP 128/70 | HR 65 | Temp 97.8°F | Ht 69.0 in | Wt 221.2 lb

## 2017-12-25 DIAGNOSIS — C9190 Lymphoid leukemia, unspecified not having achieved remission: Secondary | ICD-10-CM

## 2017-12-25 DIAGNOSIS — W57XXXA Bitten or stung by nonvenomous insect and other nonvenomous arthropods, initial encounter: Secondary | ICD-10-CM

## 2017-12-25 DIAGNOSIS — C9110 Chronic lymphocytic leukemia of B-cell type not having achieved remission: Secondary | ICD-10-CM

## 2017-12-25 DIAGNOSIS — S30861A Insect bite (nonvenomous) of abdominal wall, initial encounter: Secondary | ICD-10-CM | POA: Diagnosis not present

## 2017-12-25 MED ORDER — TRIAMCINOLONE ACETONIDE 0.1 % EX CREA: 1.0000 "application " | TOPICAL_CREAM | Freq: Two times a day (BID) | CUTANEOUS | 0 refills | Status: DC

## 2017-12-25 MED ORDER — DOXYCYCLINE HYCLATE 100 MG PO TABS: 100.0000 mg | ORAL_TABLET | Freq: Two times a day (BID) | ORAL | 0 refills | Status: DC

## 2017-12-25 NOTE — Patient Instructions (Signed)
I do recommend we treat you with 10 day doxycycline course after recent tick bite with other symptoms. This will cover tick illness as well as skin infection around tick bite on navel.  May use steroid cream sent to pharmacy for itchy rash around navel.  Stay well hydrated.  Avoid too much sun while on antibiotic - as it will make you more prone to sunburn.

## 2017-12-25 NOTE — Assessment & Plan Note (Addendum)
With new headache, fatigue, R hand swelling - will treat with 10d doxy course to cover possible tick borne illness. Rx triamcinolone topical cream for pruritic rash around tick bite at umbilicus - anticipate tick bite reaction with developing cellulitis, not consistent with erythema migrans. Doxy will cover cellulitis as well.  Red flags to seek further care, update if not improving with treatment.

## 2017-12-25 NOTE — Progress Notes (Signed)
BP 128/70 (BP Location: Left Arm, Patient Position: Sitting, Cuff Size: Large)   Pulse 65   Temp 97.8 F (36.6 C) (Oral)   Ht 5\' 9"  (1.753 m)   Wt 221 lb 4 oz (100.4 kg)   SpO2 97%   BMI 32.67 kg/m    CC: tick bite Subjective:    Patient ID: [name], male    DOB: 01/11/1946, 72 y.o.   MRN: [id number]  HPI: [name] is a 72 y.o. male presenting on 12/25/2017 for Insect Bite (Site is located on abd in navel. Removed on 12/23/17. C/o feeling tired and has swelling in right hand. )   3 ticks removed 12/23/2017 (2 on back, 1 in navel). Unsure how long they were attached. Residual inflammation and irritation around navel. Wife removed them, thinks entire tick removed.   Increasing fatigue, mild headache, R hand swelling noted yesterday.  Took tylenol today.   No new joint pains. No fevers/chills, abdominal pain, nausea, rashes.   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 Interim medical history since our last visit reviewed. Allergies and medications reviewed and updated. Outpatient Medications Prior to Visit  Medication Sig Dispense Refill  . acetaminophen (TYLENOL) 500 MG tablet Take 1,000 mg by mouth every 8 (eight) hours as needed for mild pain.    Marland Kitchen albuterol (PROVENTIL HFA;VENTOLIN HFA) 108 (90 Base) MCG/ACT inhaler Inhale 2 puffs into the lungs every 6 (six) hours as needed for wheezing or shortness of breath. 1 Inhaler 11  . amiodarone (PACERONE) 200 MG tablet Take 0.5 tablets (100 mg total) by mouth 2 (two) times daily. 30 tablet 3  . apixaban (ELIQUIS) 5 MG TABS tablet Take 1 tablet (5 mg total) by mouth 2 (two) times daily. 180 tablet 3  . atorvastatin (LIPITOR) 80 MG tablet Take 1 tablet (80 mg total) by mouth at bedtime. 90 tablet 3  . budesonide-formoterol (SYMBICORT) 160-4.5 MCG/ACT inhaler Inhale 2 puffs into the lungs 2 (two) times daily. 1 Inhaler 11  .  cetirizine (ZYRTEC) 10 MG tablet Take 10 mg by mouth daily as needed for allergies.     . Coenzyme Q10 (COQ10) 200 MG CAPS Take 200 mg by mouth daily.    Marland Kitchen ezetimibe (ZETIA) 10 MG tablet Take 1 tablet (10 mg total) by mouth daily. 90 tablet 3  . Krill Oil 350 MG CAPS Take 350 mg by mouth every evening.    . metoprolol tartrate (LOPRESSOR) 25 MG tablet Take 1 tablet (25 mg total) by mouth 2 (two) times daily as needed. (Patient taking differently: Take 25 mg by mouth 2 (two) times daily. ) 180 tablet 3  . mirtazapine (REMERON) 15 MG tablet Take 15 mg by mouth at bedtime as needed (for panic associated with PTSD).     . Multiple Vitamin (MULTIVITAMIN WITH MINERALS) TABS tablet Take 1 tablet by mouth daily.    . nitroGLYCERIN (NITROSTAT) 0.4 MG SL tablet Place 1 tablet (0.4 mg total) under the tongue every 5 (five) minutes as needed for chest pain. 25 tablet 6  . oxyCODONE (OXY IR/ROXICODONE) 5 MG immediate release tablet Take 1 tablet (5 mg total) by mouth every 4 (four) hours as needed for moderate pain (pain score 4-6). 30 tablet 0  . tiotropium (SPIRIVA) 18 MCG inhalation capsule Place 1 capsule (18 mcg total) into inhaler and inhale at bedtime. 30 capsule 11  . traMADol (ULTRAM) 50  MG tablet TAKE 1 TABLET BY MOUTH EVERY 12 HOURS AS NEEDED FOR MODERATE PAIN 90 tablet 0   No facility-administered medications prior to visit.      Per HPI unless specifically indicated in ROS section below Review of Systems     Objective:    BP 128/70 (BP Location: Left Arm, Patient Position: Sitting, Cuff Size: Large)   Pulse 65   Temp 97.8 F (36.6 C) (Oral)   Ht 5\' 9"  (1.753 m)   Wt 221 lb 4 oz (100.4 kg)   SpO2 97%   BMI 32.67 kg/m   Wt Readings from Last 3 Encounters:  12/25/17 221 lb 4 oz (100.4 kg)  11/19/17 220 lb 14.4 oz (100.2 kg)  11/04/17 223 lb (101.2 kg)    Physical Exam  Constitutional: He appears well-developed and well-nourished. No distress.  Abdominal: Soft. Bowel sounds are  normal. He exhibits no distension and no mass. There is no tenderness. There is no rebound and no guarding. No hernia.  Musculoskeletal: He exhibits edema.  R dorsal hand swelling with preserved ROM at digits and wrist 2+ rad pulses bilat  Skin: Skin is warm. There is erythema.  Small healing erosions to L upper back and L lateral side At navel, no residual tick parts identified. Site of tick bite seems to be healing, however there is surrounding erythematous rash with induration around area of tick bite as well as some bruising of skin inferiorly. Excoriations with abrasions around tick bite from scratching  Nursing note and vitals reviewed.  Results for orders placed or performed in visit on 11/19/17  Lactate dehydrogenase  Result Value Ref Range   LDH 168 98 - 192 U/L  Comprehensive metabolic panel  Result Value Ref Range   Sodium 137 135 - 145 mmol/L   Potassium 4.3 3.5 - 5.1 mmol/L   Chloride 103 101 - 111 mmol/L   CO2 25 22 - 32 mmol/L   Glucose, Bld 118 (H) 65 - 99 mg/dL   BUN 18 6 - 20 mg/dL   Creatinine, Ser 0.97 0.61 - 1.24 mg/dL   Calcium 8.9 8.9 - 10.3 mg/dL   Total Protein 6.8 6.5 - 8.1 g/dL   Albumin 3.6 3.5 - 5.0 g/dL   AST 24 15 - 41 U/L   ALT 30 17 - 63 U/L   Alkaline Phosphatase 111 38 - 126 U/L   Total Bilirubin 0.9 0.3 - 1.2 mg/dL   GFR calc non Af Amer >60 >60 mL/min   GFR calc Af Amer >60 >60 mL/min   Anion gap 9 5 - 15  CBC with Differential/Platelet  Result Value Ref Range   WBC 28.4 (H) 3.8 - 10.6 K/uL   RBC 3.40 (L) 4.40 - 5.90 MIL/uL   Hemoglobin 11.3 (L) 13.0 - 18.0 g/dL   HCT 33.3 (L) 40.0 - 52.0 %   MCV 97.7 80.0 - 100.0 fL   MCH 33.2 26.0 - 34.0 pg   MCHC 34.0 32.0 - 36.0 g/dL   RDW 15.3 (H) 11.5 - 14.5 %   Platelets 324 150 - 440 K/uL   Neutrophils Relative % 17 %   Neutro Abs 4.8 1.4 - 6.5 K/uL   Lymphocytes Relative 79 %   Lymphs Abs 22.5 (H) 1.0 - 3.6 K/uL   Monocytes Relative 2 %   Monocytes Absolute 0.5 0.2 - 1.0 K/uL    Eosinophils Relative 2 %   Eosinophils Absolute 0.5 0 - 0.7 K/uL   Basophils Relative 0 %  Basophils Absolute 0.1 0 - 0.1 K/uL  FISH HES Leukemia,4q12 REA  Result Value Ref Range   Specimen Type Comment:    Cells Counted: Comment:    Cells Analyzed Comment:    FISH Result Comment:    Interpretation: Comment:    Director Review: Comment:       Assessment & Plan:   Problem List Items Addressed This Visit    CLL (chronic lymphocytic leukemia) ([place])   Relevant Medications   doxycycline (VIBRA-TABS) 100 MG tablet   Tick bite of abdomen - Primary    With new headache, fatigue, R hand swelling - will treat with 10d doxy course to cover possible tick borne illness. Rx triamcinolone topical cream for pruritic rash around tick bite at umbilicus - anticipate tick bite reaction with developing cellulitis, not consistent with erythema migrans. Doxy will cover cellulitis as well.  Red flags to seek further care, update if not improving with treatment.           Meds ordered this encounter  Medications  . doxycycline (VIBRA-TABS) 100 MG tablet    Sig: Take 1 tablet (100 mg total) by mouth 2 (two) times daily.    Dispense:  20 tablet    Refill:  0  . triamcinolone cream (KENALOG) 0.1 %    Sig: Apply 1 application topically 2 (two) times daily. Apply to AA.    Dispense:  30 g    Refill:  0   No orders of the defined types were placed in this encounter.   Follow up plan: Return if symptoms worsen or fail to improve.  Ria Bush, MD

## 2018-01-05 ENCOUNTER — Telehealth: Payer: Self-pay | Admitting: *Deleted

## 2018-01-05 NOTE — Telephone Encounter (Signed)
Has 3 lumps in his neck he had one for a while and he noticed the others yesterday. Of note he did see PCP 12/25/17 for tick bite and was given 10 days of doxycycline for it. Appointment given for [facility] tomorrow at 945 lab, NP

## 2018-01-06 ENCOUNTER — Inpatient Hospital Stay: Payer: Medicare HMO

## 2018-01-06 ENCOUNTER — Inpatient Hospital Stay: Payer: Medicare HMO | Attending: Oncology | Admitting: Oncology

## 2018-01-06 VITALS — BP 161/98 | HR 62 | Temp 97.9°F | Resp 20 | Wt 219.0 lb

## 2018-01-06 DIAGNOSIS — C9110 Chronic lymphocytic leukemia of B-cell type not having achieved remission: Secondary | ICD-10-CM | POA: Diagnosis not present

## 2018-01-06 DIAGNOSIS — Z87891 Personal history of nicotine dependence: Secondary | ICD-10-CM | POA: Insufficient documentation

## 2018-01-06 DIAGNOSIS — L03114 Cellulitis of left upper limb: Secondary | ICD-10-CM | POA: Diagnosis not present

## 2018-01-06 DIAGNOSIS — R609 Edema, unspecified: Secondary | ICD-10-CM | POA: Diagnosis not present

## 2018-01-06 DIAGNOSIS — W57XXXA Bitten or stung by nonvenomous insect and other nonvenomous arthropods, initial encounter: Secondary | ICD-10-CM

## 2018-01-06 LAB — COMPREHENSIVE METABOLIC PANEL
ALK PHOS: 80 U/L (ref 38–126)
ALT: 15 U/L — AB (ref 17–63)
ANION GAP: 7 (ref 5–15)
AST: 17 U/L (ref 15–41)
Albumin: 3.8 g/dL (ref 3.5–5.0)
BILIRUBIN TOTAL: 0.8 mg/dL (ref 0.3–1.2)
BUN: 20 mg/dL (ref 6–20)
CALCIUM: 8.8 mg/dL — AB (ref 8.9–10.3)
CO2: 22 mmol/L (ref 22–32)
CREATININE: 1.08 mg/dL (ref 0.61–1.24)
Chloride: 110 mmol/L (ref 101–111)
Glucose, Bld: 118 mg/dL — ABNORMAL HIGH (ref 65–99)
Potassium: 4.4 mmol/L (ref 3.5–5.1)
Sodium: 139 mmol/L (ref 135–145)
TOTAL PROTEIN: 6.3 g/dL — AB (ref 6.5–8.1)

## 2018-01-06 LAB — CBC WITH DIFFERENTIAL/PLATELET
Basophils Absolute: 0.2 10*3/uL — ABNORMAL HIGH (ref 0–0.1)
Basophils Relative: 1 %
Eosinophils Absolute: 0.5 10*3/uL (ref 0–0.7)
Eosinophils Relative: 2 %
HCT: 35 % — ABNORMAL LOW (ref 40.0–52.0)
HEMOGLOBIN: 11.8 g/dL — AB (ref 13.0–18.0)
LYMPHS ABS: 23.6 10*3/uL — AB (ref 1.0–3.6)
LYMPHS PCT: 83 %
MCH: 33.1 pg (ref 26.0–34.0)
MCHC: 33.7 g/dL (ref 32.0–36.0)
MCV: 98.4 fL (ref 80.0–100.0)
MONOS PCT: 1 %
Monocytes Absolute: 0.4 10*3/uL (ref 0.2–1.0)
Neutro Abs: 3.8 10*3/uL (ref 1.4–6.5)
Neutrophils Relative %: 13 %
Platelets: 147 10*3/uL — ABNORMAL LOW (ref 150–440)
RBC: 3.56 MIL/uL — AB (ref 4.40–5.90)
RDW: 15.8 % — ABNORMAL HIGH (ref 11.5–14.5)
WBC: 28.4 10*3/uL — AB (ref 3.8–10.6)

## 2018-01-06 LAB — LACTATE DEHYDROGENASE: LDH: 147 U/L (ref 98–192)

## 2018-01-06 MED ORDER — CEPHALEXIN 500 MG PO CAPS: 500.0000 mg | ORAL_CAPSULE | Freq: Three times a day (TID) | ORAL | 0 refills | Status: DC

## 2018-01-06 NOTE — Progress Notes (Signed)
Symptom Management Consult note [facility]  [phone] [phone]  Patient Care Team: Leone Haven, MD as PCP - General (Family Medicine) [name], MD as Consulting Physician (Cardiology)   Name of the patient: [name]  [id number]  1946-05-18   Date of visit: 01/06/18  Diagnosis- CLL   Chief complaint/ Reason for visit- Left arm pain and swelling  Heme/Onc history:  Oncology History   # AUG 2015- SLL/CLL [Right Ax Ln Bx] s/p Benda-Rituxan x6 [finished March 2016]; Maintenance Rituxan q 11m[started April 2016; Dr.Pandit];Last Ritux Jan 2017.  MARCH 2017- CT N/C/A/P- NED. 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 The location of the edema is arm(s) left.  The edema has been moderate.  Onset of symptoms was 1 day ago, gradually worsening since that time. The edema is present all day. The patient states never have they had symptoms befroe.  The swelling has been aggravated by recent hornet bite, relieved by nothing, and been associated with pain in upper left arm and new cervical lymph nodes.  He was recently treated with doxycycline for 10 days for a tick bite at the umbilicus.  ECOG FS:0 - Asymptomatic  Review of systems- Review of Systems  Constitutional: Positive for malaise/fatigue. Negative for chills, fever and weight loss.  HENT: Negative for congestion and ear pain.   Eyes: Negative.  Negative for blurred vision and double vision.  Respiratory: Negative.  Negative for cough, sputum production and shortness of breath.   Cardiovascular: Negative.  Negative for chest pain, palpitations and leg  swelling.  Gastrointestinal: Negative.  Negative for abdominal pain, constipation, diarrhea, nausea and vomiting.  Genitourinary: Negative for dysuria, frequency and urgency.  Musculoskeletal: Negative for back pain and falls.  Skin: Positive for rash (Left upper arm pain and swelling).  Neurological: Negative.  Negative for weakness and headaches.  Endo/Heme/Allergies: Negative.  Does not bruise/bleed easily.  Psychiatric/Behavioral: Negative.  Negative for depression. The patient is not nervous/anxious and does not have insomnia.      Current treatment- Observation  No Known Allergies   Past Medical History:  Diagnosis Date  . Anxiety   . Arthritis   . Atrial fibrillation ([place]    a. Dx 2013, recurred 02/2014, CHA2DS2VASc = 3 -->placed on Eliquis;  b. 02/2014 Echo: EF 50-55%, mid and apical anterior septum and mid and apical inf septum are abnl, mild to mod Ao sclerosis w/o AS.  .Marland KitchenChicken pox   . Chronic lymphocytic leukemia ([place]    a. Dx 02/2014.  .Marland KitchenComplication of anesthesia    History of  PTSD--do not touch patient when waking up from surgery.  .Marland KitchenCOPD (chronic obstructive pulmonary disease) ([place]   . Coronary artery disease    a. 04/2009 CABG x 3 (LIMA->LAD, VG->OM1, VG->PDA);  b. 09/2009 Cath: occluded VG x 2 w/ patent LIMA and L->R collats. EF 55%, mild antlat HK;  c. 10/2011 MV: EF 53%, no isch/infarct-->low risk.  .Marland KitchenDysrhythmia    hx of a-fib  . GERD (gastroesophageal reflux disease)    occasional  . History of chemotherapy 2015-2016  .  HOH (hard of hearing)    Bilateral Hearing Aids  . Hypertension   . Myocardial infarction ([place]) 2010  . OSA on CPAP    USE C-PAP  . Presence of permanent cardiac pacemaker 2017  . PTSD (post-traumatic stress disorder)   . PTSD (post-traumatic stress disorder)   . Pure hypercholesterolemia   . Rheumatic fever 1959     Past Surgical History:  Procedure Laterality Date  . ABDOMINAL HERNIA REPAIR    . APPENDECTOMY  06/21/1985  .  CARDIAC CATHETERIZATION  2010; 2011   ; Dr Fletcher Anon  . CORONARY ARTERY BYPASS GRAFT  04/2009   "CABG X3"  . EP IMPLANTABLE DEVICE N/A 03/03/2016   Procedure: Pacemaker Implant;  Surgeon: Deboraha Sprang, MD;  Location: [facility];  Service: Cardiovascular;  Laterality: N/A;  . FOREIGN BODY REMOVAL  1968   "shrapnel in my tailbone"  . INGUINAL HERNIA REPAIR Right   . INSERT / REPLACE / REMOVE PACEMAKER    . JOINT REPLACEMENT Right 2018  . LAPAROSCOPIC CHOLECYSTECTOMY    . TONSILLECTOMY AND ADENOIDECTOMY  1956  . TOTAL HIP ARTHROPLASTY Right 10/22/2016   Procedure: TOTAL HIP ARTHROPLASTY;  Surgeon: Dereck Leep, MD;  Location: [facility];  Service: Orthopedics;  Laterality: Right;  . TOTAL HIP ARTHROPLASTY Left 11/04/2017   Procedure: TOTAL HIP ARTHROPLASTY;  Surgeon: Dereck Leep, MD;  Location: [facility];  Service: Orthopedics;  Laterality: Left;    Social History   Socioeconomic History  . Marital status: Married    Spouse name: Not on file  . Number of children: Not on file  . Years of education: Not on file  . Highest education level: Not on file  Occupational History  . Occupation: retired    Fish farm manager: Transport planner    Comment: [place]  . Financial resource strain: Not on file  . Food insecurity:    Worry: Not on file    Inability: Not on file  . Transportation needs:    Medical: Not on file    Non-medical: Not on file  Tobacco Use  . Smoking status: Former Smoker    Packs/day: 1.00    Years: 40.00    Pack years: 40.00    Types: Cigarettes    Last attempt to quit: 07/21/2006    Years since quitting: 11.4  . Smokeless tobacco: Never Used  Substance and Sexual Activity  . Alcohol use: Yes    Alcohol/week: 0.6 oz    Types: 1 Standard drinks or equivalent per week    Comment: rarely  . Drug use: No  . Sexual activity: Not Currently  Lifestyle  . Physical activity:    Days per week: Not on file    Minutes per session: Not  on file  . Stress: Not on file  Relationships  . Social connections:    Talks on phone: Not on file    Gets together: Not on file    Attends religious service: Not on file    Active member of club or organization: Not on file    Attends meetings of clubs or organizations: Not on file    Relationship status: Not on file  . Intimate partner violence:    Fear of current or ex partner: Not on file    Emotionally abused: Not on file    Physically abused: Not on file    Forced sexual activity: Not on file  Other Topics Concern  . Not on file  Social History Narrative   Lives in [place] with wife. Dogs. Goats. Children - 4. Grandchildren - 7.      Worked - Sports coach, part time.   Diet - healthy   Exercise - very active      Service - [employer] - [place]    Family History  Problem Relation Age of Onset  . Heart disease Mother   . Coronary artery disease Unknown        family history     Current Outpatient Medications:  .  acetaminophen (TYLENOL) 500 MG tablet, Take 1,000 mg by mouth every 8 (eight) hours as needed for mild pain., Disp: , Rfl:  .  albuterol (PROVENTIL HFA;VENTOLIN HFA) 108 (90 Base) MCG/ACT inhaler, Inhale 2 puffs into the lungs every 6 (six) hours as needed for wheezing or shortness of breath., Disp: 1 Inhaler, Rfl: 11 .  amiodarone (PACERONE) 200 MG tablet, Take 0.5 tablets (100 mg total) by mouth 2 (two) times daily., Disp: 30 tablet, Rfl: 3 .  apixaban (ELIQUIS) 5 MG TABS tablet, Take 1 tablet (5 mg total) by mouth 2 (two) times daily., Disp: 180 tablet, Rfl: 3 .  atorvastatin (LIPITOR) 80 MG tablet, Take 1 tablet (80 mg total) by mouth at bedtime., Disp: 90 tablet, Rfl: 3 .  budesonide-formoterol (SYMBICORT) 160-4.5 MCG/ACT inhaler, Inhale 2 puffs into the lungs 2 (two) times daily., Disp: 1 Inhaler, Rfl: 11 .  cetirizine (ZYRTEC) 10 MG tablet, Take 10 mg by mouth daily as needed for allergies. , Disp: , Rfl:  .  Coenzyme Q10 (COQ10) 200 MG CAPS, Take 200 mg  by mouth daily., Disp: , Rfl:  .  ezetimibe (ZETIA) 10 MG tablet, Take 1 tablet (10 mg total) by mouth daily., Disp: 90 tablet, Rfl: 3 .  Krill Oil 350 MG CAPS, Take 350 mg by mouth every evening., Disp: , Rfl:  .  metoprolol tartrate (LOPRESSOR) 25 MG tablet, Take 1 tablet (25 mg total) by mouth 2 (two) times daily as needed. (Patient taking differently: Take 25 mg by mouth 2 (two) times daily. ), Disp: 180 tablet, Rfl: 3 .  mirtazapine (REMERON) 15 MG tablet, Take 15 mg by mouth at bedtime as needed (for panic associated with PTSD). , Disp: , Rfl:  .  Multiple Vitamin (MULTIVITAMIN WITH MINERALS) TABS tablet, Take 1 tablet by mouth daily., Disp: , Rfl:  .  nitroGLYCERIN (NITROSTAT) 0.4 MG SL tablet, Place 1 tablet (0.4 mg total) under the tongue every 5 (five) minutes as needed for chest pain., Disp: 25 tablet, Rfl: 6 .  tiotropium (SPIRIVA) 18 MCG inhalation capsule, Place 1 capsule (18 mcg total) into inhaler and inhale at bedtime., Disp: 30 capsule, Rfl: 11 .  traMADol (ULTRAM) 50 MG tablet, TAKE 1 TABLET BY MOUTH EVERY 12 HOURS AS NEEDED FOR MODERATE PAIN, Disp: 90 tablet, Rfl: 0 .  triamcinolone cream (KENALOG) 0.1 %, Apply 1 application topically 2 (two) times daily. Apply to AA., Disp: 30 g, Rfl: 0 .  cephALEXin (KEFLEX) 500 MG capsule, Take 1 capsule (500 mg total) by mouth 3 (three) times daily., Disp: 21 capsule, Rfl: 0  Physical exam:  Vitals:   01/06/18 1023  BP: (!) 161/98  Pulse: 62  Resp: 20  Temp: 97.9 F (36.6 C)  TempSrc: Tympanic  SpO2: 97%  Weight: 219 lb (99.3 kg)   Physical Exam  Constitutional: He is oriented to person, place, and time. Vital signs are normal. He appears well-developed and well-nourished.  HENT:  Head: Normocephalic  and atraumatic.  Eyes: Pupils are equal, round, and reactive to light.  Neck: Normal range of motion.  Cardiovascular: Normal rate, regular rhythm and normal heart sounds.  No murmur heard. Pulmonary/Chest: Effort normal and breath  sounds normal. He has no wheezes.  Abdominal: Soft. Normal appearance and bowel sounds are normal. He exhibits no distension. There is no tenderness.  Musculoskeletal: Normal range of motion. He exhibits no edema.  Lymphadenopathy:    He has cervical adenopathy.       Right cervical: No superficial cervical and no posterior cervical adenopathy present.      Left cervical: Superficial cervical and posterior cervical adenopathy present.  Neurological: He is alert and oriented to person, place, and time.  Skin: Skin is warm and dry. No rash noted. There is erythema.     Psychiatric: Judgment normal.     CMP Latest Ref Rng & Units 01/06/2018  Glucose 65 - 99 mg/dL 118(H)  BUN 6 - 20 mg/dL 20  Creatinine 0.61 - 1.24 mg/dL 1.08  Sodium 135 - 145 mmol/L 139  Potassium 3.5 - 5.1 mmol/L 4.4  Chloride 101 - 111 mmol/L 110  CO2 22 - 32 mmol/L 22  Calcium 8.9 - 10.3 mg/dL 8.8(L)  Total Protein 6.5 - 8.1 g/dL 6.3(L)  Total Bilirubin 0.3 - 1.2 mg/dL 0.8  Alkaline Phos 38 - 126 U/L 80  AST 15 - 41 U/L 17  ALT 17 - 63 U/L 15(L)   CBC Latest Ref Rng & Units 01/06/2018  WBC 3.8 - 10.6 K/uL 28.4(H)  Hemoglobin 13.0 - 18.0 g/dL 11.8(L)  Hematocrit 40.0 - 52.0 % 35.0(L)  Platelets 150 - 440 K/uL 147(L)    No images are attached to the encounter.  No results found.   Assessment and plan- Patient is a 72 y.o. male who presents for left upper arm cellulitis and new posterior and anterior cervical adenopathy.  1.  CLL: Currently on observation. Received 6 cycles of Rituxan.  Last Rituxan was January 2017.  Had CT abdomen/chest/pelvis in March 2017 revealing NED.  Has not required additional treatment at this time.  Scheduled to see Dr. Rogue Bussing with labs in August 2019.   2. Left arm cellulitis: D/t hornet bite.  Assessment reveals hot, red, tight and swollen left upper arm with obvious insect bite.  Patient does not have any known allergies.  He was recently treated with doxycycline for tick  bite for 10 days.  Lymphadenopathy present in left posterior and anterior cervical area.  He denies any fevers. RX Keflex 500 mg TID for seven days.  Explained to patient if he develops a fever to let us know or worsening adenopathy.  Believe adenopathy is due to cellulitis.  If adenopathy persists will proceed with imaging.  Patient denies any clinical signs of progression of CLL including thrombocytopenia, worsening anemia, worsening kidney function or worsening leukocytosis.  Denies B symptoms. Patient in agreement with plan.  Will call patient the next few days to see if this is improving.   Visit Diagnosis 1. CLL (chronic lymphocytic leukemia) ([place])     Patient expressed understanding and was in agreement with this plan. He also understands that He can call clinic at any time with any questions, concerns, or complaints.   Greater than 50% was spent in counseling and coordination of care with this patient including but not limited to discussion of the relevant topics above (See A&P) including, but not limited to diagnosis and management of acute and chronic medical conditions.  Faythe Casa, AGNP-C [facility] at [place]- [phone] Pager- [phone] 01/06/2018 1:09 PM

## 2018-01-14 ENCOUNTER — Ambulatory Visit (INDEPENDENT_AMBULATORY_CARE_PROVIDER_SITE_OTHER): Payer: Medicare HMO | Admitting: Internal Medicine

## 2018-01-14 ENCOUNTER — Encounter: Payer: Self-pay | Admitting: Internal Medicine

## 2018-01-14 ENCOUNTER — Ambulatory Visit: Payer: Self-pay

## 2018-01-14 VITALS — BP 122/70 | HR 67 | Temp 97.4°F | Ht 69.0 in | Wt 219.4 lb

## 2018-01-14 DIAGNOSIS — Z23 Encounter for immunization: Secondary | ICD-10-CM

## 2018-01-14 DIAGNOSIS — S90561A Insect bite (nonvenomous), right ankle, initial encounter: Secondary | ICD-10-CM

## 2018-01-14 DIAGNOSIS — W57XXXA Bitten or stung by nonvenomous insect and other nonvenomous arthropods, initial encounter: Secondary | ICD-10-CM

## 2018-01-14 DIAGNOSIS — T148XXA Other injury of unspecified body region, initial encounter: Secondary | ICD-10-CM | POA: Diagnosis not present

## 2018-01-14 DIAGNOSIS — L0390 Cellulitis, unspecified: Secondary | ICD-10-CM | POA: Diagnosis not present

## 2018-01-14 DIAGNOSIS — T63464A Toxic effect of venom of wasps, undetermined, initial encounter: Secondary | ICD-10-CM | POA: Diagnosis not present

## 2018-01-14 MED ORDER — DOXYCYCLINE HYCLATE 100 MG PO TABS: 100.0000 mg | ORAL_TABLET | Freq: Two times a day (BID) | ORAL | 0 refills | Status: DC

## 2018-01-14 MED ORDER — LEVOFLOXACIN 500 MG PO TABS: 500.0000 mg | ORAL_TABLET | Freq: Every day | ORAL | 0 refills | Status: DC

## 2018-01-14 MED ORDER — MUPIROCIN 2 % EX OINT: 1.0000 "application " | TOPICAL_OINTMENT | Freq: Three times a day (TID) | CUTANEOUS | 1 refills | Status: DC

## 2018-01-14 NOTE — Telephone Encounter (Signed)
appt made with Dr. Aundra Dubin.

## 2018-01-14 NOTE — Telephone Encounter (Signed)
fyi

## 2018-01-14 NOTE — Telephone Encounter (Signed)
Pt. Reports he was stung by a wasp 2 days ago on his right ankle. States it has become swollen, red and he has blisters on foot/ankle. No known allergy to bee stings.Appointment made for today.Instructed if he develops chest pain or shortness of breath to call 911. Verbalizes understanding. Reason for Disposition . [1] Red or very tender (to touch) area AND [2] started over 24 hours after the sting  Answer Assessment - Initial Assessment Questions 1. TYPE: "What type of sting was it?" (bee, yellow jacket, etc.)      Wasp 2. ONSET: "When did it occur?"      2 days ago 3. LOCATION: "Where is the sting located?"  "How many stings?"     Right ankle 4. SWELLING SIZE: "How big is the swelling?" (inches or centimeters)     Unsure 5. REDNESS: "Is the area red or pink?" If so, ask "What size is area of redness?" (inches or cm). "When did the redness start?"     Redness and blisters 6. PAIN: "Is there any pain?" If so, ask: "How bad is it?"  (Scale 1-10; or mild, moderate, severe)     9 7. ITCHING: "Is there any itching?" If so, ask: "How bad is it?"      Yes 8. RESPIRATORY DISTRESS: "Describe your breathing."     No distress but does have COPD 9. PRIOR REACTIONS: "Have you had any severe allergic reactions to stings in the past?" if yes, ask: "What happened?"     No 10. OTHER SYMPTOMS: "Do you have any other symptoms?" (e.g., face or tongue swelling, new rash elsewhere, abdominal pain, vomiting)       No 11. PREGNANCY: "Is there any chance you are pregnant?" "When was your last menstrual period?"       n/a  Protocols used: BEE OR YELLOW JACKET STING-A-AH

## 2018-01-14 NOTE — Patient Instructions (Addendum)
Take Zyrtec 10 mg at at night  Zantac 2x per day for now  Bactroban 2-3 x per day on area Try Tramadol as needed  Take Doxycycline 2x per day x 1 week and levaquin 1x per day with food x 1 week    Wound Care, Adult Taking care of your wound properly can help to prevent pain and infection. It can also help your wound to heal more quickly. How is this treated? Wound care  Follow instructions from your health care provider about how to take care of your wound. Make sure you: ? Wash your hands with soap and water before you change the bandage (dressing). If soap and water are not available, use hand sanitizer. ? Change your dressing as told by your health care provider. ? Leave stitches (sutures), skin glue, or adhesive strips in place. These skin closures may need to stay in place for 2 weeks or longer. If adhesive strip edges start to loosen and curl up, you may trim the loose edges. Do not remove adhesive strips completely unless your health care provider tells you to do that.  Check your wound area every day for signs of infection. Check for: ? More redness, swelling, or pain. ? More fluid or blood. ? Warmth. ? Pus or a bad smell.  Ask your health care provider if you should clean the wound with mild soap and water. Doing this may include: ? Using a clean towel to pat the wound dry after cleaning it. Do not rub or scrub the wound. ? Applying a cream or ointment. Do this only as told by your health care provider. ? Covering the incision with a clean dressing.  Ask your health care provider when you can leave the wound uncovered. Medicines   If you were prescribed an antibiotic medicine, cream, or ointment, take or use the antibiotic as told by your health care provider. Do not stop taking or using the antibiotic even if your condition improves.  Take over-the-counter and prescription medicines only as told by your health care provider. If you were prescribed pain medicine, take it at  least 30 minutes before doing any wound care or as told by your health care provider. General instructions  Return to your normal activities as told by your health care provider. Ask your health care provider what activities are safe.  Do not scratch or pick at the wound.  Keep all follow-up visits as told by your health care provider. This is important.  Eat a diet that includes protein, vitamin A, vitamin C, and other nutrient-rich foods. These help the wound heal: ? Protein-rich foods include meat, dairy, beans, nuts, and other sources. ? Vitamin A-rich foods include carrots and dark green, leafy vegetables. ? Vitamin C-rich foods include citrus, tomatoes, and other fruits and vegetables. ? Nutrient-rich foods have protein, carbohydrates, fat, vitamins, or minerals. Eat a variety of healthy foods including vegetables, fruits, and whole grains. Contact a health care provider if:  You received a tetanus shot and you have swelling, severe pain, redness, or bleeding at the injection site.  Your pain is not controlled with medicine.  You have more redness, swelling, or pain around the wound.  You have more fluid or blood coming from the wound.  Your wound feels warm to the touch.  You have pus or a bad smell coming from the wound.  You have a fever or chills.  You are nauseous or you vomit.  You are dizzy. Get help right away  if:  You have a red streak going away from your wound.  The edges of the wound open up and separate.  Your wound is bleeding and the bleeding does not stop with gentle pressure.  You have a rash.  You faint.  You have trouble breathing. This information is not intended to replace advice given to you by your health care provider. Make sure you discuss any questions you have with your health care provider. Document Released: 04/15/2008 Document Revised: 03/05/2016 Document Reviewed: 01/22/2016 Elsevier Interactive Patient Education  2017 [place], Adult A blister is a raised bubble of skin filled with liquid. Blisters often develop in an area of the skin that repeatedly rubs or presses against another surface (friction blister). Friction blisters can occur on any part of the body, but they usually develop on the hands or feet. Long-term pressure on the same area of the skin can also lead to areas of hardened skin (calluses). What are the causes? A blister can be caused by:  An injury.  A burn.  An allergic reaction.  An infection.  Exposure to irritating chemicals.  Friction, especially in an area with a lot of heat and moisture.  Friction blisters often result from:  Sports.  Repetitive activities.  Using tools and doing other activities without wearing gloves.  Shoes that are too tight or too loose.  What are the signs or symptoms? A blister is often round and looks like a bump. It may:  Itch.  Be painful to the touch.  Before a blister forms, the skin may:  Become red.  Feel warm.  Itch.  Be painful to the touch.  How is this diagnosed? A blister is diagnosed with a physical exam. How is this treated? Treatment usually involves protecting the area where the blister has formed until the skin has healed. Other treatments may include:  A bandage (dressing) to cover the blister.  Extra padding around and over the blister, so that it does not rub on anything.  Antibiotic ointment.  Most blisters break open, dry up, and go away on their own within 1-2 weeks. Blisters that are very painful may be drained before they break open on their own. If the blister is large or painful, it can be drained by: 1. Sterilizing a small needle with rubbing alcohol. 2. Washing your hands with soap and water. 3. Inserting the needle in the edge of the blister to make a small hole. Some fluid will drain out of the hole. Let the top or roof of the blister stay in place. This helps the skin  heal. 4. Washing the blister with mild soap and water. 5. Covering the blister with antibiotic ointment, if prescribed by your health care provider, and a dressing.  Some blisters may need to be drained by a health care provider. Follow these instructions at home:  Protect the area where the blister has formed as told by your health care provider.  Keep your blister clean and dry. This helps to prevent infection.  Do not pop your blister. This can cause infection.  If you were prescribed an antibiotic, use it as told by your health care provider. Do not stop using the antibiotic even if your condition improves.  Wear different shoes until the blister heals.  Avoid the activity that caused the blister until your blister heals.  Check your blister every day for signs of infection. Check for: ? More redness, swelling, or pain. ?  More fluid or blood. ? Warmth. ? Pus or a bad smell. ? The blister getting better and then getting worse. How is this prevented? Taking these steps can help to prevent blisters that are caused by friction. Make sure you:  Wear comfortable shoes that fit well.  Always wear socks with shoes.  Wear extra socks or use tape, bandages, or pads over blister-prone areas as needed. You may also apply petroleum jelly under bandages in blister-prone areas.  Wear protective gear, such as gloves, when participating in sports or activities that can cause blisters.  Wear loose-fitting, moisture-wicking clothes when participating in sports or activities.  Use powders as needed to keep your feet dry.  Contact a health care provider if:  You have more redness, swelling, or pain around your blister.  You have more fluid or blood coming from your blister.  Your blister feels warm to the touch.  You have pus or a bad smell coming from your blister.  You have a fever or chills.  Your blister gets better and then it gets worse. This information is not intended to  replace advice given to you by your health care provider. Make sure you discuss any questions you have with your health care provider. Document Released: 08/14/2004 Document Revised: 03/05/2016 Document Reviewed: 01/18/2016 Elsevier Interactive Patient Education  2018 [place], Adult An insect bite can make your skin red, itchy, and swollen. An insect bite is different from an insect sting, which happens when an insect injects poison (venom) into the skin. Some insects can spread disease to people through a bite. However, most insect bites do not lead to disease and are not serious. What are the causes? Insects may bite for a variety of reasons, including:  Hunger.  To defend themselves.  Insects that bite include:  Spiders.  Mosquitoes.  Ticks.  Fleas.  Ants.  Flies.  Bedbugs.  What are the signs or symptoms? Symptoms of this condition include:  Itching or pain in the bite area.  Redness and swelling in the bite area.  An open wound (skin ulcer).  In many cases, symptoms last for 2-4 days. How is this diagnosed? This condition is usually diagnosed based on symptoms and a physical exam. How is this treated? Treatment is usually not needed. Symptoms often go away on their own. When treatment is recommended, it may involve:  Applying a cream or lotion to the bitten area. This treatment helps with itching.  Taking an antibiotic medicine. This treatment is needed if the bite area gets infected.  Getting a tetanus shot.  Applying ice to the affected area.  Medicines called antihistamines. This treatment is needed if you develop an allergic reaction to the insect bite.  Follow these instructions at home: Bite area care  Do not scratch the bite area.  Keep the bite area clean and dry. Wash it every day with soap and water as told by your health care provider.  Check the bite area every day for signs of infection. Check for: ? More redness,  swelling, or pain. ? Fluid or blood. ? Warmth. ? Pus. Managing pain, itching, and swelling   You may apply a baking soda paste, cortisone cream, or calamine lotion to the bite area as told by your health care provider.  If directed, applyice to the bite area. ? Put ice in a plastic bag. ? Place a towel between your skin and the bag. ? Leave the ice on for 20 minutes, 2-3  times per day. Medicines  Apply or take over-the-counter and prescription medicines only as told by your health care provider.  If you were prescribed an antibiotic medicine, use it as told by your health care provider. Do not stop using the antibiotic even if your condition improves. General instructions  Keep all follow-up visits as told by your health care provider. This is important. How is this prevented? To help reduce your risk of insect bites:  When you are outdoors, wear clothing that covers your arms and legs.  Use insect repellent. The best insect repellents contain: ? DEET, picaridin, oil of lemon eucalyptus (OLE), or IR3535. ? Higher amounts of an active ingredient.  If your home windows do not have screens, consider installing them.  Contact a health care provider if:  You have more redness, swelling, or pain in the bite area.  You have fluid, blood, or pus coming from the bite area.  The bite area feels warm to the touch.  You have a fever. Get help right away if:  You have joint pain.  You have a rash.  You have shortness of breath.  You feel unusually tired or sleepy.  You have neck pain.  You have a headache.  You have unusual weakness.  You have chest pain.  You have nausea, vomiting, or pain in the abdomen. This information is not intended to replace advice given to you by your health care provider. Make sure you discuss any questions you have with your health care provider. Document Released: 08/14/2004 Document Revised: 03/05/2016 Document Reviewed:  01/14/2016 Elsevier Interactive Patient Education  [employer].

## 2018-01-14 NOTE — Progress Notes (Signed)
Chief Complaint  Patient presents with  . Insect Bite   Acute  6/24 had insect bite wasp to right ankle with multiple large blisters with pain and redness around site wife tried triple antibiotic ointment and guaze and taper to cover. He has been on antibiotics 6/7 x 10 days for tick bite doxy and 01/06/18 keflex x 1 week for cellulitis. He also has swelling of right foot   Review of Systems  Cardiovascular: Positive for leg swelling.  Skin:       -insect bite/cellulitis    Past Medical History:  Diagnosis Date  . Anxiety   . Arthritis   . Atrial fibrillation ([place])    a. Dx 2013, recurred 02/2014, CHA2DS2VASc = 3 -->placed on Eliquis;  b. 02/2014 Echo: EF 50-55%, mid and apical anterior septum and mid and apical inf septum are abnl, mild to mod Ao sclerosis w/o AS.  Marland Kitchen Chicken pox   . Chronic lymphocytic leukemia ([place])    a. Dx 02/2014.  Marland Kitchen Complication of anesthesia    History of  PTSD--do not touch patient when waking up from surgery.  Marland Kitchen COPD (chronic obstructive pulmonary disease) ([place])   . Coronary artery disease    a. 04/2009 CABG x 3 (LIMA->LAD, VG->OM1, VG->PDA);  b. 09/2009 Cath: occluded VG x 2 w/ patent LIMA and L->R collats. EF 55%, mild antlat HK;  c. 10/2011 MV: EF 53%, no isch/infarct-->low risk.  Marland Kitchen Dysrhythmia    hx of a-fib  . GERD (gastroesophageal reflux disease)    occasional  . History of chemotherapy 2015-2016  . HOH (hard of hearing)    Bilateral Hearing Aids  . Hypertension   . Myocardial infarction ([place]) 2010  . OSA on CPAP    USE C-PAP  . Presence of permanent cardiac pacemaker 2017  . PTSD (post-traumatic stress disorder)   . PTSD (post-traumatic stress disorder)   . Pure hypercholesterolemia   . Rheumatic fever 1959   Past Surgical History:  Procedure Laterality Date  . ABDOMINAL HERNIA REPAIR    . APPENDECTOMY  06/21/1985  . CARDIAC CATHETERIZATION  2010; 2011   ; Dr Fletcher Anon  . CORONARY ARTERY BYPASS GRAFT  04/2009   "CABG X3"  . EP IMPLANTABLE  DEVICE N/A 03/03/2016   Procedure: Pacemaker Implant;  Surgeon: Deboraha Sprang, MD;  Location: [facility];  Service: Cardiovascular;  Laterality: N/A;  . FOREIGN BODY REMOVAL  1968   "shrapnel in my tailbone"  . INGUINAL HERNIA REPAIR Right   . INSERT / REPLACE / REMOVE PACEMAKER    . JOINT REPLACEMENT Right 2018  . LAPAROSCOPIC CHOLECYSTECTOMY    . TONSILLECTOMY AND ADENOIDECTOMY  1956  . TOTAL HIP ARTHROPLASTY Right 10/22/2016   Procedure: TOTAL HIP ARTHROPLASTY;  Surgeon: Dereck Leep, MD;  Location: [facility];  Service: Orthopedics;  Laterality: Right;  . TOTAL HIP ARTHROPLASTY Left 11/04/2017   Procedure: TOTAL HIP ARTHROPLASTY;  Surgeon: Dereck Leep, MD;  Location: [facility];  Service: Orthopedics;  Laterality: Left;   Family History  Problem Relation Age of Onset  . Heart disease Mother   . Coronary artery disease Unknown        family history   Social History   Socioeconomic History  . Marital status: Married    Spouse name: Not on file  . Number of children: Not on file  . Years of education: Not on file  . Highest education level: Not on file  Occupational History  . Occupation: retired  Employer: [employer]    Comment: Education officer, [employer]  Social Needs  . Financial resource strain: Not on file  . Food insecurity:    Worry: Not on file    Inability: Not on file  . Transportation needs:    Medical: Not on file    Non-medical: Not on file  Tobacco Use  . Smoking status: Former Smoker    Packs/day: 1.00    Years: 40.00    Pack years: 40.00    Types: Cigarettes    Last attempt to quit: 07/21/2006    Years since quitting: 11.4  . Smokeless tobacco: Never Used  Substance and Sexual Activity  . Alcohol use: Yes    Alcohol/week: 0.6 oz    Types: 1 Standard drinks or equivalent per week    Comment: rarely  . Drug use: No  . Sexual activity: Not Currently  Lifestyle  . Physical activity:    Days per week: Not on file    Minutes per  session: Not on file  . Stress: Not on file  Relationships  . Social connections:    Talks on phone: Not on file    Gets together: Not on file    Attends religious service: Not on file    Active member of club or organization: Not on file    Attends meetings of clubs or organizations: Not on file    Relationship status: Not on file  . Intimate partner violence:    Fear of current or ex partner: Not on file    Emotionally abused: Not on file    Physically abused: Not on file    Forced sexual activity: Not on file  Other Topics Concern  . Not on file  Social History Narrative   Lives in [place] with wife. Dogs. Goats. Children - 4. Grandchildren - 7.      Worked - Sports coach, part time.   Diet - healthy   Exercise - very active      Service - [employer] - [place]   Current Meds  Medication Sig  . acetaminophen (TYLENOL) 500 MG tablet Take 1,000 mg by mouth every 8 (eight) hours as needed for mild pain.  Marland Kitchen albuterol (PROVENTIL HFA;VENTOLIN HFA) 108 (90 Base) MCG/ACT inhaler Inhale 2 puffs into the lungs every 6 (six) hours as needed for wheezing or shortness of breath.  Marland Kitchen amiodarone (PACERONE) 200 MG tablet Take 0.5 tablets (100 mg total) by mouth 2 (two) times daily.  Marland Kitchen apixaban (ELIQUIS) 5 MG TABS tablet Take 1 tablet (5 mg total) by mouth 2 (two) times daily.  Marland Kitchen atorvastatin (LIPITOR) 80 MG tablet Take 1 tablet (80 mg total) by mouth at bedtime.  . budesonide-formoterol (SYMBICORT) 160-4.5 MCG/ACT inhaler Inhale 2 puffs into the lungs 2 (two) times daily.  . cetirizine (ZYRTEC) 10 MG tablet Take 10 mg by mouth daily as needed for allergies.   . Coenzyme Q10 (COQ10) 200 MG CAPS Take 200 mg by mouth daily.  Marland Kitchen ezetimibe (ZETIA) 10 MG tablet Take 1 tablet (10 mg total) by mouth daily.  Javier Docker Oil 350 MG CAPS Take 350 mg by mouth every evening.  . metoprolol tartrate (LOPRESSOR) 25 MG tablet Take 1 tablet (25 mg total) by mouth 2 (two) times daily as needed. (Patient taking  differently: Take 25 mg by mouth 2 (two) times daily. )  . mirtazapine (REMERON) 15 MG tablet Take 15 mg by mouth at bedtime as needed (for panic associated with PTSD).   Marland Kitchen  Multiple Vitamin (MULTIVITAMIN WITH MINERALS) TABS tablet Take 1 tablet by mouth daily.  . nitroGLYCERIN (NITROSTAT) 0.4 MG SL tablet Place 1 tablet (0.4 mg total) under the tongue every 5 (five) minutes as needed for chest pain.  Marland Kitchen tiotropium (SPIRIVA) 18 MCG inhalation capsule Place 1 capsule (18 mcg total) into inhaler and inhale at bedtime.  . traMADol (ULTRAM) 50 MG tablet TAKE 1 TABLET BY MOUTH EVERY 12 HOURS AS NEEDED FOR MODERATE PAIN  . triamcinolone cream (KENALOG) 0.1 % Apply 1 application topically 2 (two) times daily. Apply to AA.   No Known Allergies Recent Results (from the past 2160 hour(s))  Surgical pcr screen     Status: None   Collection Time: 10/20/17  2:16 PM  Result Value Ref Range   MRSA, PCR NEGATIVE NEGATIVE   Staphylococcus aureus NEGATIVE NEGATIVE    Comment: (NOTE) The Xpert SA Assay (FDA approved for NASAL specimens in patients 30 years of age and older), is one component of a comprehensive surveillance program. It is not intended to diagnose infection nor to guide or monitor treatment. Performed at [facility], [address] [place], [place] [address]   Urinalysis, Routine w reflex microscopic     Status: Abnormal   Collection Time: 10/20/17  2:23 PM  Result Value Ref Range   Color, Urine STRAW (A) YELLOW   APPearance CLEAR (A) CLEAR   Specific Gravity, Urine 1.008 1.005 - 1.030   pH 7.0 5.0 - 8.0   Glucose, UA NEGATIVE NEGATIVE mg/dL   Hgb urine dipstick SMALL (A) NEGATIVE   Bilirubin Urine NEGATIVE NEGATIVE   Ketones, ur NEGATIVE NEGATIVE mg/dL   Protein, ur NEGATIVE NEGATIVE mg/dL   Nitrite NEGATIVE NEGATIVE   Leukocytes, UA NEGATIVE NEGATIVE   RBC / HPF 0-5 0 - 5 RBC/hpf   WBC, UA 0-5 0 - 5 WBC/hpf   Bacteria, UA NONE SEEN NONE SEEN   Squamous Epithelial / LPF  NONE SEEN NONE SEEN    Comment: Performed at [facility], [address] [place], [place] [address]  APTT     Status: None   Collection Time: 10/27/17  9:46 AM  Result Value Ref Range   aPTT 32 24 - 36 seconds    Comment: Performed at [facility], [place], [place] [address]  C-reactive protein     Status: None   Collection Time: 10/27/17  9:46 AM  Result Value Ref Range   CRP <0.8 <1.0 mg/dL    Comment: Performed at [facility] [address], [place], [place] [address]  CBC     Status: Abnormal   Collection Time: 10/27/17  9:46 AM  Result Value Ref Range   WBC 24.0 (H) 3.8 - 10.6 K/uL   RBC 4.20 (L) 4.40 - 5.90 MIL/uL   Hemoglobin 13.5 13.0 - 18.0 g/dL   HCT 40.9 40.0 - 52.0 %   MCV 97.4 80.0 - 100.0 fL   MCH 32.0 26.0 - 34.0 pg   MCHC 32.9 32.0 - 36.0 g/dL   RDW 15.1 (H) 11.5 - 14.5 %   Platelets 134 (L) 150 - 440 K/uL    Comment: Performed at [facility], [address] [place], [place] [address]  Comprehensive metabolic panel     Status: Abnormal   Collection Time: 10/27/17  9:46 AM  Result Value Ref Range   Sodium 140 135 - 145 mmol/L   Potassium 3.6 3.5 - 5.1 mmol/L   Chloride 109  101 - 111 mmol/L   CO2 24 22 - 32 mmol/L   Glucose, Bld 106 (H) 65 - 99 mg/dL   BUN 18 6 - 20 mg/dL   Creatinine, Ser 0.93 0.61 - 1.24 mg/dL   Calcium 8.6 (L) 8.9 - 10.3 mg/dL   Total Protein 7.2 6.5 - 8.1 g/dL   Albumin 4.2 3.5 - 5.0 g/dL   AST 24 15 - 41 U/L   ALT 23 17 - 63 U/L   Alkaline Phosphatase 76 38 - 126 U/L   Total Bilirubin 1.4 (H) 0.3 - 1.2 mg/dL   GFR calc non Af Amer >60 >60 mL/min   GFR calc Af Amer >60 >60 mL/min    Comment: (NOTE) The eGFR has been calculated using the CKD EPI equation. This calculation has not been validated in all clinical situations. eGFR's persistently <60 mL/min signify possible Chronic Kidney Disease.    Anion gap 7 5 - 15    Comment: Performed at [facility], [address] [place], [place] [address]  Differential     Status: Abnormal   Collection Time: 10/27/17  9:46 AM  Result Value Ref Range   Neutrophils Relative % 12 %   Lymphocytes Relative 83 %   Monocytes Relative 4 %   Eosinophils Relative 1 %   Basophils Relative 0 %   Band Neutrophils 0 %   Metamyelocytes Relative 0 %   Myelocytes 0 %   Promyelocytes Relative 0 %   Blasts 0 %   nRBC 0 0 /100 WBC   Other 0 %   Neutro Abs 2.9 1.4 - 6.5 K/uL   Lymphs Abs 19.9 (H) 1.0 - 3.6 K/uL   Monocytes Absolute 1.0 0.2 - 1.0 K/uL   Eosinophils Absolute 0.2 0 - 0.7 K/uL   Basophils Absolute 0.0 0 - 0.1 K/uL   WBC Morphology ATYPICAL LYMPHOCYTES    Smear Review MORPHOLOGY UNREMARKABLE     Comment: PLATELET CLUMPS NOTED ON SMEAR, COUNT APPEARS ADEQUATE Performed at [facility] [address] [place], [place] [address]   Protime-INR     Status: Abnormal   Collection Time: 10/27/17  9:46 AM  Result Value Ref Range   Prothrombin Time 16.5 (H) 11.4 - 15.2 seconds   INR 1.34     Comment: Performed at [facility], [address] [place], [place] [address]  Sedimentation rate     Status: None   Collection Time: 10/27/17  9:46 AM  Result Value Ref Range   Sed Rate 5 0 - 20 mm/hr    Comment: Performed at [facility], [address] [place], [place] [address]  Type and screen [place]     Status: None   Collection Time: 10/27/17  9:46 AM  Result Value Ref Range   ABO/RH(D) O NEG    Antibody Screen NEG    Sample Expiration 11/10/2017    Extend sample reason      NO TRANSFUSIONS OR PREGNANCY IN THE PAST 3 MONTHS Performed at [facility], [address] [place], [place] [address]   Protime-INR     Status: None   Collection Time: 11/04/17  6:58 AM  Result Value Ref Range   Prothrombin Time 14.2 11.4 - 15.2 seconds   INR 1.11     Comment: Performed at [facility], [address], [place], [place] [address]  Surgical  pathology     Status: None   Collection Time: 11/04/17  9:17  AM  Result Value Ref Range   SURGICAL PATHOLOGY      Surgical Pathology CASE: [phone] [phone] PATIENT: Britt Boozer Surgical Pathology Report     SPECIMEN SUBMITTED: A. Femoral head, left  CLINICAL HISTORY: Known diagnosis of CLL, recent WBC 24,000  PRE-OPERATIVE DIAGNOSIS: Osteoarthritis left hip  POST-OPERATIVE DIAGNOSIS: None provided.     DIAGNOSIS: A. 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 - OSTEOARTHROSIS.  GROSS DESCRIPTION: A. Labeled: Left femoral head Received: In formalin Size of specimen:      Head - 5.1 x 5.1 cm      Neck - 4.2 x 3.2 cm Articular surface: Tan-brown with focal granularity Cut surface: tan-brown Other findings: No additional noted  Block summary: 1 - representative tissue  Tissue decalcification: Yes   Final Diagnosis performed by Quay Burow, MD.   Electronically signed 11/09/2017 2:07:58PM The electronic signature indicates that the named Attending Pathologist has evaluated the specimen  Technical co mponent performed at [facility], [address] [place], [place] [address] Lab: [phone] Dir: Rush Farmer, MD, MMM  Professional component performed at [facility], [address], [place], [place] [address] Lab: [phone] Dir: Dellia Nims. Rubinas, MD   CUP PACEART REMOTE DEVICE CHECK     Status: None   Collection Time: 11/17/17 12:56 PM  Result Value Ref Range   Date Time Interrogation Session [id number]    Pulse Generator Manufacturer MERM    Pulse Gen Model [id number] Advisa DR MRI    Pulse Gen Serial Number [id number]    Clinic Name [facility]    Implantable Pulse Generator Type Implantable Pulse Generator    Implantable Pulse Generator Implant Date 38250539    Implantable Lead Manufacturer MERM    Implantable Lead Model 5076 CapSureFix Novus MRI SureScan     Implantable Lead Serial Number [id number]    Implantable Lead Implant Date 02409735    Implantable Lead Location Detail 1 UNKNOWN    Implantable Lead Location [id number]    Implantable Lead Manufacturer MERM    Implantable Lead Model 5076 CapSureFix Novus MRI SureScan    Implantable Lead Serial Number [id number]    Implantable Lead Implant Date 41962229    Implantable Lead Location Detail 1 UNKNOWN    Implantable Lead Location [id number]    Lead Channel Setting Sensing Sensitivity 0.9 mV   Lead Channel Setting Pacing Amplitude 2 V   Lead Channel Setting Pacing Pulse Width 0.4 ms   Lead Channel Setting Pacing Amplitude 2.5 V   Lead Channel Impedance Value 456 ohm   Lead Channel Impedance Value 323 ohm   Lead Channel Sensing Intrinsic Amplitude 3.125 mV   Lead Channel Sensing Intrinsic Amplitude 3.125 mV   Lead Channel Pacing Threshold Amplitude 0.875 V   Lead Channel Pacing Threshold Pulse Width 0.4 ms   Lead Channel Impedance Value 551 ohm   Lead Channel Impedance Value 456 ohm   Lead Channel Sensing Intrinsic Amplitude 20.625 mV   Lead Channel Sensing Intrinsic Amplitude 20.625 mV   Lead Channel Pacing Threshold Amplitude 1 V   Lead Channel Pacing Threshold Pulse Width 0.4 ms   Battery Status OK    Battery Remaining Longevity 96 mo   Battery Voltage 3.01 V   Brady Statistic RA Percent Paced 95.42 %   Brady Statistic RV Percent Paced 0.14 %   Brady Statistic AP VP Percent 0.14 %   Brady Statistic AS VP Percent 0 %   [employer] AP VS  Percent 95.29 %   Brady Statistic AS VS Percent 4.57 %   Eval Rhythm ApVs   Lactate dehydrogenase     Status: None   Collection Time: 11/19/17 11:41 AM  Result Value Ref Range   LDH 168 98 - 192 U/L    Comment: Performed at [facility], [address] [place], [place] [address]  Comprehensive metabolic panel     Status: Abnormal   Collection Time: 11/19/17 11:41 AM  Result Value Ref Range   Sodium 137 135 - 145 mmol/L   Potassium 4.3 3.5  - 5.1 mmol/L   Chloride 103 101 - 111 mmol/L   CO2 25 22 - 32 mmol/L   Glucose, Bld 118 (H) 65 - 99 mg/dL   BUN 18 6 - 20 mg/dL   Creatinine, Ser 0.97 0.61 - 1.24 mg/dL   Calcium 8.9 8.9 - 10.3 mg/dL   Total Protein 6.8 6.5 - 8.1 g/dL   Albumin 3.6 3.5 - 5.0 g/dL   AST 24 15 - 41 U/L   ALT 30 17 - 63 U/L   Alkaline Phosphatase 111 38 - 126 U/L   Total Bilirubin 0.9 0.3 - 1.2 mg/dL   GFR calc non Af Amer >60 >60 mL/min   GFR calc Af Amer >60 >60 mL/min    Comment: (NOTE) The eGFR has been calculated using the CKD EPI equation. This calculation has not been validated in all clinical situations. eGFR's persistently <60 mL/min signify possible Chronic Kidney Disease.    Anion gap 9 5 - 15    Comment: Performed at [facility] [address] [place],  [place] [address]  CBC with Differential/Platelet     Status: Abnormal   Collection Time: 11/19/17 11:41 AM  Result Value Ref Range   WBC 28.4 (H) 3.8 - 10.6 K/uL   RBC 3.40 (L) 4.40 - 5.90 MIL/uL   Hemoglobin 11.3 (L) 13.0 - 18.0 g/dL   HCT 33.3 (L) 40.0 - 52.0 %   MCV 97.7 80.0 - 100.0 fL   MCH 33.2 26.0 - 34.0 pg   MCHC 34.0 32.0 - 36.0 g/dL   RDW 15.3 (H) 11.5 - 14.5 %   Platelets 324 150 - 440 K/uL   Neutrophils Relative % 17 %   Neutro Abs 4.8 1.4 - 6.5 K/uL   Lymphocytes Relative 79 %   Lymphs Abs 22.5 (H) 1.0 - 3.6 K/uL   Monocytes Relative 2 %   Monocytes Absolute 0.5 0.2 - 1.0 K/uL   Eosinophils Relative 2 %   Eosinophils Absolute 0.5 0 - 0.7 K/uL   Basophils Relative 0 %   Basophils Absolute 0.1 0 - 0.1 K/uL    Comment: Performed at [facility], [address] [place], [place] [address]  FISH HES [id number] REA     Status: None   Collection Time: 11/19/17 11:41 AM  Result Value Ref Range   Specimen Type Comment:     Comment: BLOOD   Cells Counted: Comment:     Comment: 100/PROBE   Cells Analyzed Comment:     Comment: 100/PROBE   FISH Result Comment:     Comment: (NOTE) 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 Results for  CCND1/IGH, chromosome 12 and 13q were normal.      Loss of the ATM and TP53 signals are associated with the most adverse prognoses in patients diagnosed with CLL.    SPECIFIC FISH RESULTS:  ATM: ABNORMAL   .      nuc ish 11q22.3(ATMx1)[65/100]    TP53: ABNORMAL      nuc ish 17p13.1(TP53x1)[53/100]    CCND1/IGH: NORMAL      nuc ish 11q13(CCND1x2),14q32(IGHx2)[100]  .  12cen: NORMAL     .      nuc ish 12cen(D12Z3x2)[100]    13q: NORMAL      nuc ish 13q14.3(DLEUx2),13q34(TFDP1x2)[100]      This analysis is limited to abnormalities detectable by the specific probes included in the study. FISH results should be interpreted within the context of a full cytogenetic analysis and hematologic evaluation.    REFERENCES:  Malek,(2013) Adv Exp Med Biol [id number].TDVV#[id number]  .   Schnaiter et al.,(2011) Clin Lab Med [phone].IOEV#[id number]    .This test was developed and its performace characteristics determined by [employer] ([place]). It has not been cleared or approved by the U.S. Food and Drug Administration. The DNA probe vendor for this study was [employer], research (physical sciences)).    Director Review: Comment:     Comment: (NOTE) Perfecto [place], PHD, [place] Performed At: [facility] [place], [place] [id number] Nechama Guard MD RC:[id number] Performed At: [facility] [place], [place] [id number] Rush Farmer MD OE:[id number] Performed at [facility], [address] [place], [place] [address]   Lactate dehydrogenase     Status: None   Collection Time: 01/06/18 10:06 AM  Result Value  Ref Range   LDH 147 98 - 192 U/L    Comment: Performed at [facility], [address] [place], [place] [address]  CBC with Differential     Status: Abnormal   Collection Time: 01/06/18 10:06 AM  Result Value Ref Range   WBC 28.4 (H) 3.8 - 10.6 K/uL   RBC 3.56 (L) 4.40 - 5.90 MIL/uL   Hemoglobin 11.8 (L) 13.0 - 18.0 g/dL   HCT 35.0 (L) 40.0 - 52.0 %   MCV 98.4 80.0 - 100.0 fL   MCH 33.1 26.0 - 34.0 pg   MCHC 33.7 32.0 - 36.0 g/dL   RDW 15.8 (H) 11.5 - 14.5 %   Platelets 147 (L) 150 - 440 K/uL   Neutrophils Relative % 13 %   Neutro Abs 3.8 1.4 - 6.5 K/uL   Lymphocytes Relative 83 %   Lymphs Abs 23.6 (H) 1.0 - 3.6 K/uL   Monocytes Relative 1 %   Monocytes Absolute 0.4 0.2 - 1.0 K/uL   Eosinophils Relative 2 %   Eosinophils Absolute 0.5 0 - 0.7 K/uL   Basophils Relative 1 %   Basophils Absolute 0.2 (H) 0 - 0.1 K/uL    Comment: Performed at [facility], [address] [place], [place] [address]  Comprehensive metabolic panel     Status: Abnormal   Collection Time: 01/06/18 10:06 AM  Result Value Ref Range   Sodium 139 135 - 145 mmol/L   Potassium 4.4 3.5 - 5.1 mmol/L   Chloride 110 101 - 111 mmol/L   CO2 22 22 - 32 mmol/L   Glucose, Bld 118 (H) 65 - 99 mg/dL   BUN 20 6 - 20 mg/dL   Creatinine, Ser 1.08 0.61 - 1.24 mg/dL   Calcium 8.8 (L) 8.9 -  10.3 mg/dL   Total Protein 6.3 (L) 6.5 - 8.1 g/dL   Albumin 3.8 3.5 - 5.0 g/dL   AST 17 15 - 41 U/L   ALT 15 (L) 17 - 63 U/L   Alkaline Phosphatase 80 38 - 126 U/L   Total Bilirubin 0.8 0.3 - 1.2 mg/dL   GFR calc non Af Amer >60 >60 mL/min   GFR calc Af Amer >60 >60 mL/min    Comment: (NOTE) The eGFR has been calculated using the CKD EPI equation. This calculation has not been validated in all clinical situations. eGFR's persistently <60 mL/min signify possible Chronic Kidney Disease.    Anion gap 7 5 - 15    Comment: Performed at [facility], [address] [place], [place] [address]   Objective    Body mass index is 32.4 kg/m. Wt Readings from Last 3 Encounters:  01/14/18 219 lb 6.4 oz (99.5 kg)  01/06/18 219 lb (99.3 kg)  12/25/17 221 lb 4 oz (100.4 kg)   Temp Readings from Last 3 Encounters:  01/14/18 (!) 97.4 F (36.3 C) (Oral)  01/06/18 97.9 F (36.6 C) (Tympanic)  12/25/17 97.8 F (36.6 C) (Oral)   BP Readings from Last 3 Encounters:  01/14/18 122/70  01/06/18 (!) 161/98  12/25/17 128/70   Pulse Readings from Last 3 Encounters:  01/14/18 67  01/06/18 62  12/25/17 65    Physical Exam  Constitutional: He is oriented to person, place, and time. Vital signs are normal. He appears well-developed and well-nourished. He is cooperative.  HENT:  Head: Normocephalic and atraumatic.  Mouth/Throat: Oropharynx is clear and moist and mucous membranes are normal.  Eyes: Pupils are equal, round, and reactive to light. Conjunctivae are normal.  Cardiovascular: Normal rate, regular rhythm and normal heart sounds.  Pulmonary/Chest: Effort normal and breath sounds normal.  Neurological: He is alert and oriented to person, place, and time.  Skin: Rash noted. There is erythema.     Right ankle multiple large blisters x 3 largest 4 cm and others 2-3 cm and redness posterior ankle  Cellulitis changes   Psychiatric: He has a normal mood and affect. His speech is normal and behavior is normal. Judgment and thought content normal. Cognition and memory are normal.  Nursing note and vitals reviewed.   Assessment   1. 3 large blisters to right ankle s/p wasp sting 01/11/18  Plan   1. Cleaned with betadine and drained all 3 with sterile needle with serous drainage clear 1/3 most posterior blisters still intact others drained  bactroban tid levaquin and doxy x 1 week  Tdap today  If worsening go to ED for iv abx Leg elevation and keep covered     Provider: Dr. Olivia Mackie McLean-Scocuzza-Internal Medicine

## 2018-01-14 NOTE — Progress Notes (Signed)
Pre visit review using our clinic review tool, if applicable. No additional management support is needed unless otherwise documented below in the visit note. 

## 2018-01-18 ENCOUNTER — Ambulatory Visit (INDEPENDENT_AMBULATORY_CARE_PROVIDER_SITE_OTHER): Payer: Medicare HMO | Admitting: Pulmonary Disease

## 2018-01-18 ENCOUNTER — Ambulatory Visit
Admission: RE | Admit: 2018-01-18 | Discharge: 2018-01-18 | Disposition: A | Discharge status: Home / Self Care | Payer: Medicare HMO | Source: Ambulatory Visit | Attending: Pulmonary Disease | Admitting: Pulmonary Disease

## 2018-01-18 ENCOUNTER — Encounter: Payer: Self-pay | Admitting: Pulmonary Disease

## 2018-01-18 VITALS — BP 138/78 | HR 79 | Ht 69.0 in | Wt 215.0 lb

## 2018-01-18 DIAGNOSIS — J449 Chronic obstructive pulmonary disease, unspecified: Secondary | ICD-10-CM | POA: Diagnosis not present

## 2018-01-18 DIAGNOSIS — R0600 Dyspnea, unspecified: Secondary | ICD-10-CM | POA: Diagnosis not present

## 2018-01-18 DIAGNOSIS — Z950 Presence of cardiac pacemaker: Secondary | ICD-10-CM | POA: Diagnosis not present

## 2018-01-18 DIAGNOSIS — G4733 Obstructive sleep apnea (adult) (pediatric): Secondary | ICD-10-CM

## 2018-01-18 DIAGNOSIS — Z951 Presence of aortocoronary bypass graft: Secondary | ICD-10-CM | POA: Diagnosis not present

## 2018-01-18 IMAGING — CR DG CHEST 2V
1 series · 2 of 2 positions shown · non-contrast
Comparison: [DATE], [DATE]

CLINICAL DATA: Moderate exertional dyspnea, history coronary artery
disease post MI, atrial fibrillation, CLL, COPD, GERD, hypertension,
PTSD

EXAM:
CHEST - 2 VIEW

[Series 1: dg chest 2 view · 0.14mm/px · 2 of 2 slices shown]
[im 1/2]
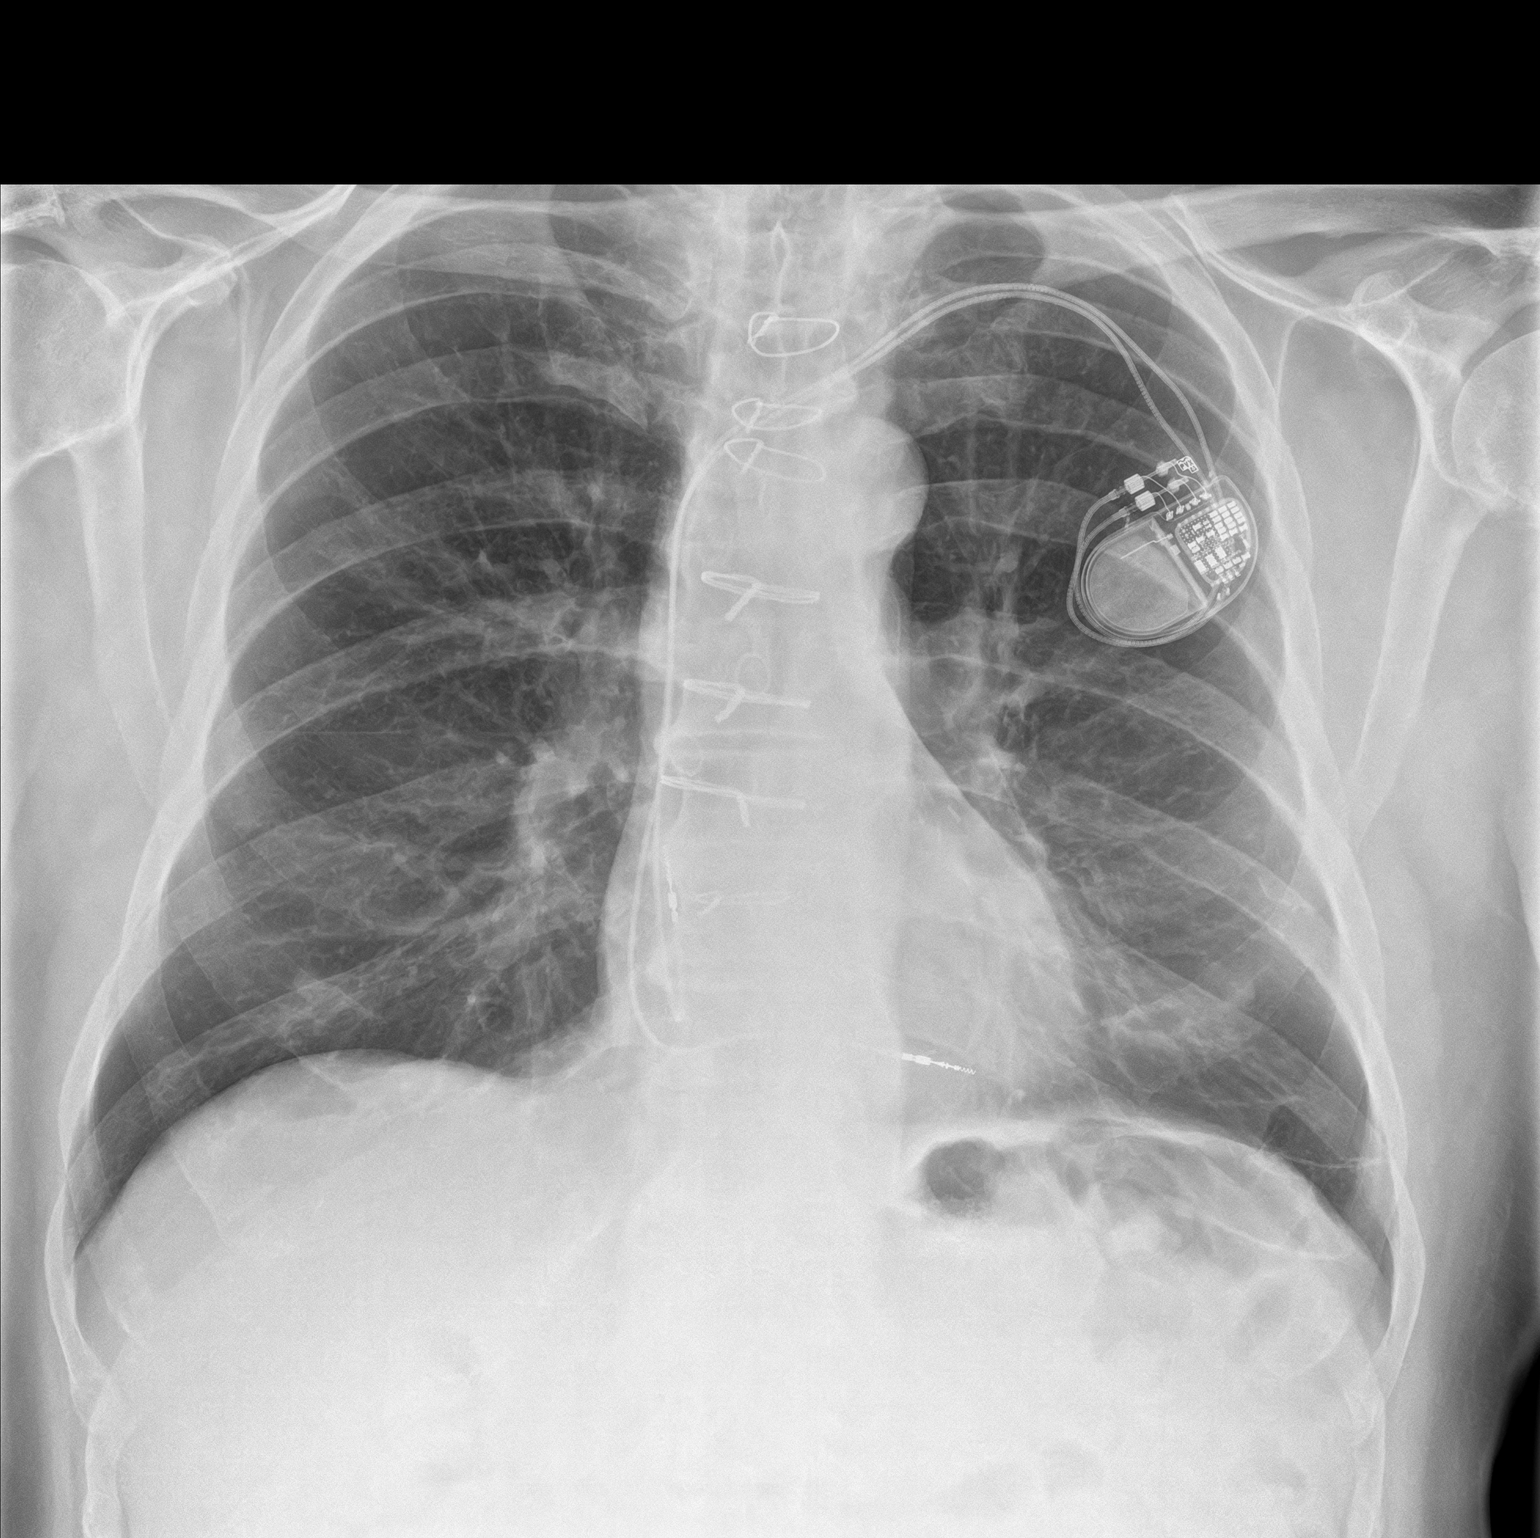
[im 2/2]
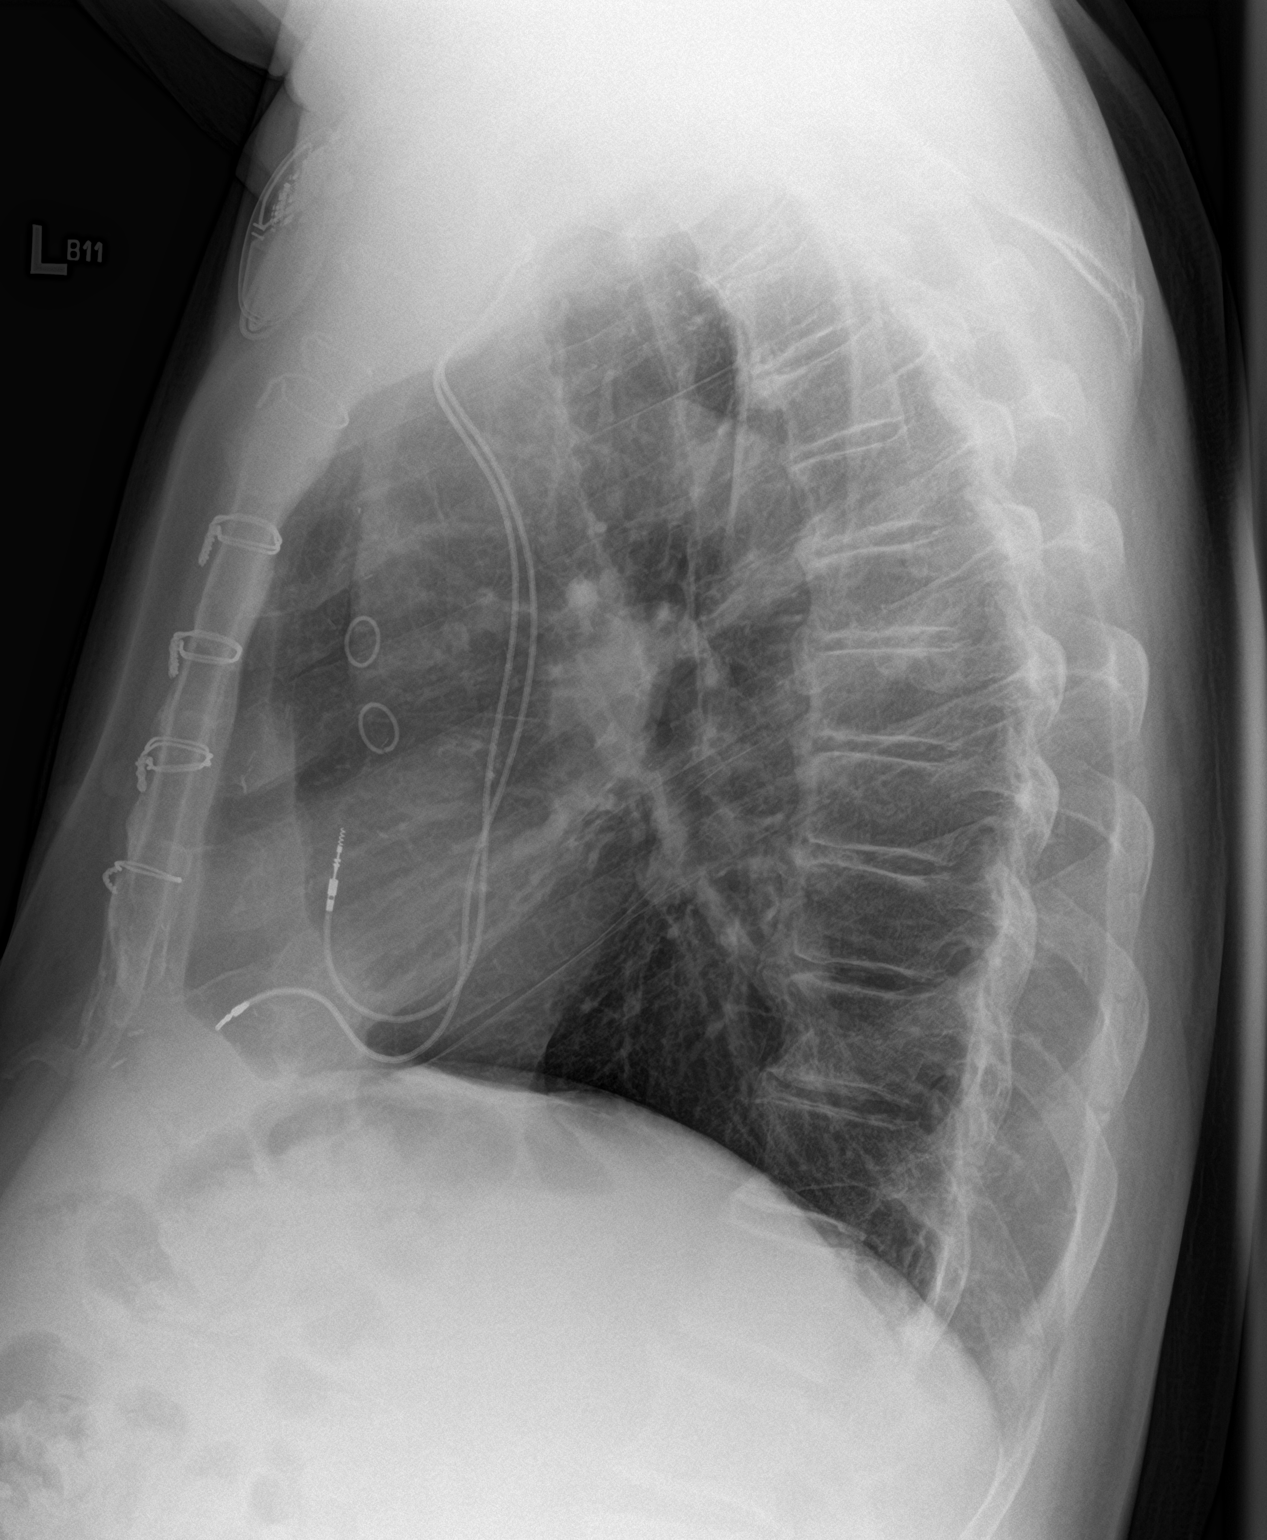

[2 of 2 positions shown; findings below may reference images not displayed]

FINDINGS: Sequential LEFT subclavian transvenous pacemaker with leads
projecting at RIGHT atrium and RIGHT ventricle, unchanged.

Normal heart size post CABG.

Mediastinal contours and pulmonary vascularity normal.

BILATERAL nipple shadows, corresponding to nipple markers on an
earlier exam.

Mild central peribronchial thickening without pulmonary infiltrate,
pleural effusion or pneumothorax.

Bones demineralized.
IMPRESSION: Post CABG and pacemaker.

Mild chronic bronchitic changes without infiltrate.

## 2018-01-18 NOTE — Patient Instructions (Signed)
Chest x-ray ordered for today (or this week).  We will contact him if there are any findings of concern.  Continue Symbicort and Spiriva  Continue albuterol as needed  Continue CPAP with sleep  Follow-up in 6 months or sooner as needed

## 2018-01-18 NOTE — Progress Notes (Signed)
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 Probable mild obstruction (FEV1 70% pred) with gas trapping (RV 134% pred) and mild-mod decrease in DLCO PSG (07/25/15): Overall AHI < 10/hr but > 100/hr in supine position. ROV (07/30/15): Dyspnea on exertion is moderately improved with scheduled use of Symbicort. He is also on Spiriva, PRN albuterol with rare use of albuterol MDI. PSG results not available. Continue same COPD regimen CPAP titration (08/29/15): recommend full mask with CPAP 8 cm H2O CPAP compliance 11/26-12/25/18: Usage 30/30. Greater than 4 hrs: 27/30.   INTERVAL: No major pulmonary events. Concern raised for recurrence/progression of CLL   SUBJ : This is a scheduled follow up.  There have been no major pulmonary events since last visit.  He continues to have moderate exertional dyspnea with little day-to-day variation.  He does note that the recent hot/humid weather has been particularly problematic.  He is using albuterol rescue inhaler approximately 2 times per day.  He denies exertional chest pain, fevers, cough, hemoptysis, purulent sputum.  He has suffered a recent wasp staying to his right lower extremity and therefore has some mild right ankle swelling.  He continues to wear CPAP compliantly for sleep apnea.   Current Outpatient Medications:  .  acetaminophen (TYLENOL) 500 MG tablet, Take 1,000 mg by mouth every 8 (eight) hours as needed for mild pain., Disp: , Rfl:  .  albuterol (PROVENTIL HFA;VENTOLIN HFA) 108 (90 Base) MCG/ACT inhaler, Inhale 2 puffs into the lungs every 6 (six) hours as needed for wheezing or shortness of breath., Disp: 1 Inhaler, Rfl: 11 .  amiodarone  (PACERONE) 200 MG tablet, Take 0.5 tablets (100 mg total) by mouth 2 (two) times daily., Disp: 30 tablet, Rfl: 3 .  apixaban (ELIQUIS) 5 MG TABS tablet, Take 1 tablet (5 mg total) by mouth 2 (two) times daily., Disp: 180 tablet, Rfl: 3 .  atorvastatin (LIPITOR) 80 MG tablet, Take 1 tablet (80 mg total) by mouth at bedtime., Disp: 90 tablet, Rfl: 3 .  budesonide-formoterol (SYMBICORT) 160-4.5 MCG/ACT inhaler, Inhale 2 puffs into the lungs 2 (two) times daily., Disp: 1 Inhaler, Rfl: 11 .  cetirizine (ZYRTEC) 10 MG tablet, Take 10 mg by mouth daily as needed for allergies. , Disp: , Rfl:  .  Coenzyme Q10 (COQ10) 200 MG CAPS, Take 200 mg by mouth daily., Disp: , Rfl:  .  ezetimibe (ZETIA) 10 MG tablet, Take 1 tablet (10 mg total) by mouth daily., Disp: 90 tablet, Rfl: 3 .  Krill Oil 350 MG CAPS, Take 350 mg by mouth every evening., Disp: , Rfl:  .  levofloxacin (LEVAQUIN) 500 MG tablet, Take 1 tablet (500 mg total) by mouth daily., Disp: 7 tablet, Rfl: 0 .  metoprolol tartrate (LOPRESSOR) 25 MG tablet, Take 1 tablet (25 mg total) by mouth 2 (two) times daily as needed. (Patient taking differently: Take 25 mg by mouth 2 (two) times daily. ), Disp: 180 tablet, Rfl: 3 .  mirtazapine (REMERON) 15 MG tablet, Take 15 mg by mouth at bedtime as needed (for panic associated with PTSD). , Disp: , Rfl:  .  Multiple Vitamin (MULTIVITAMIN WITH MINERALS) TABS tablet, Take 1 tablet by mouth daily., Disp: , Rfl:  .  mupirocin ointment (  BACTROBAN) 2 %, Apply 1 application topically 3 (three) times daily., Disp: 30 g, Rfl: 1 .  nitroGLYCERIN (NITROSTAT) 0.4 MG SL tablet, Place 1 tablet (0.4 mg total) under the tongue every 5 (five) minutes as needed for chest pain., Disp: 25 tablet, Rfl: 6 .  tiotropium (SPIRIVA) 18 MCG inhalation capsule, Place 1 capsule (18 mcg total) into inhaler and inhale at bedtime., Disp: 30 capsule, Rfl: 11 .  traMADol (ULTRAM) 50 MG tablet, TAKE 1 TABLET BY MOUTH EVERY 12 HOURS AS NEEDED FOR  MODERATE PAIN, Disp: 90 tablet, Rfl: 0 .  triamcinolone cream (KENALOG) 0.1 %, Apply 1 application topically 2 (two) times daily. Apply to AA., Disp: 30 g, Rfl: 0    Vitals:   01/18/18 0836 01/18/18 0840  BP:  138/78  Pulse:  79  SpO2:  96%  Weight: 215 lb (97.5 kg)   Height: 5\' 9"  (1.753 m)   RA  EXAM:  Gen: NAD HEENT: WNL Neck: No JVD Lungs: No wheezes Cardiovascular: Regular, no M Abdomen: Soft, NABS, NT Ext: Right ankle is wrapped in Ace bandage, no left ankle edema Neuro: No deficits  DATA:   BMP Latest Ref Rng & Units 01/06/2018 11/19/2017 10/27/2017  Glucose 65 - 99 mg/dL 118(H) 118(H) 106(H)  BUN 6 - 20 mg/dL 20 18 18   Creatinine 0.61 - 1.24 mg/dL 1.08 0.97 0.93  Sodium 135 - 145 mmol/L 139 137 140  Potassium 3.5 - 5.1 mmol/L 4.4 4.3 3.6  Chloride 101 - 111 mmol/L 110 103 109  CO2 22 - 32 mmol/L 22 25 24   Calcium 8.9 - 10.3 mg/dL 8.8(L) 8.9 8.6(L)    CBC Latest Ref Rng & Units 01/06/2018 11/19/2017 10/27/2017  WBC 3.8 - 10.6 K/uL 28.4(H) 28.4(H) 24.0(H)  Hemoglobin 13.0 - 18.0 g/dL 11.8(L) 11.3(L) 13.5  Hematocrit 40.0 - 52.0 % 35.0(L) 33.3(L) 40.9  Platelets 150 - 440 K/uL 147(L) 324 134(L)   CXR: No recent film   IMPRESSION:   Moderate chronic obstructive pulmonary disease -adequately controlled on current regimen OSA - well controlled on CPAP Epistaxis  PLAN:  Chest x-ray ordered for today (or sometime this week).  We will contact him if there are any findings of concern.  Continue Symbicort and Spiriva  Continue albuterol as needed  Continue CPAP with sleep  Follow-up in 6 months or sooner as needed   Merton Border, MD PCCM service Mobile [phone] Pager [phone] 01/18/2018 8:43 AM

## 2018-01-25 ENCOUNTER — Encounter: Payer: Self-pay | Admitting: Internal Medicine

## 2018-01-25 ENCOUNTER — Ambulatory Visit (INDEPENDENT_AMBULATORY_CARE_PROVIDER_SITE_OTHER): Payer: Medicare HMO | Admitting: Internal Medicine

## 2018-01-25 VITALS — BP 130/60 | HR 86 | Temp 97.6°F | Ht 69.0 in | Wt 214.4 lb

## 2018-01-25 DIAGNOSIS — L0390 Cellulitis, unspecified: Secondary | ICD-10-CM | POA: Diagnosis not present

## 2018-01-25 DIAGNOSIS — T63464A Toxic effect of venom of wasps, undetermined, initial encounter: Secondary | ICD-10-CM | POA: Diagnosis not present

## 2018-01-25 DIAGNOSIS — W57XXXA Bitten or stung by nonvenomous insect and other nonvenomous arthropods, initial encounter: Secondary | ICD-10-CM

## 2018-01-25 DIAGNOSIS — S90561A Insect bite (nonvenomous), right ankle, initial encounter: Secondary | ICD-10-CM | POA: Diagnosis not present

## 2018-01-25 DIAGNOSIS — T148XXA Other injury of unspecified body region, initial encounter: Secondary | ICD-10-CM

## 2018-01-25 MED ORDER — MUPIROCIN 2 % EX OINT: 1.0000 "application " | TOPICAL_OINTMENT | Freq: Three times a day (TID) | CUTANEOUS | 1 refills | Status: DC

## 2018-01-25 NOTE — Progress Notes (Signed)
Pre visit review using our clinic review tool, if applicable. No additional management support is needed unless otherwise documented below in the visit note. 

## 2018-01-25 NOTE — Patient Instructions (Signed)
Fu in 3-4 months with PCP   Insect Bite, Adult An insect bite can make your skin red, itchy, and swollen. Some insects can spread disease to people with a bite. However, most insect bites do not lead to disease, and most are not serious. Follow these instructions at home: Bite area care  Do not scratch the bite area.  Keep the bite area clean and dry.  Wash the bite area every day with soap and water as told by your doctor.  Check the bite area every day for signs of infection. Check for: ? More redness, swelling, or pain. ? Fluid or blood. ? Warmth. ? Pus. Managing pain, itching, and swelling  You may put any of these on the bite area as told by your doctor: ? A baking soda paste. ? Cortisone cream. ? Calamine lotion.  If directed, put ice on the bite area. ? Put ice in a plastic bag. ? Place a towel between your skin and the bag. ? Leave the ice on for 20 minutes, 2-3 times a day. Medicines  Take medicines or put medicines on your skin only as told by your doctor.  If you were prescribed an antibiotic medicine, use it as told by your doctor. Do not stop using the antibiotic even if your condition improves. General instructions  Keep all follow-up visits as told by your doctor. This is important. How is this prevented? To help you have a lower risk of insect bites:  When you are outside, wear clothing that covers your arms and legs.  Use insect repellent. The best insect repellents have: ? An active ingredient of DEET, picaridin, oil of lemon eucalyptus (OLE), or IR3535. ? Higher amounts of DEET or another active ingredient than other repellents have.  If your home windows do not have screens, think about putting some in.  Contact a doctor if:  You have more redness, swelling, or pain in the bite area.  You have fluid, blood, or pus coming from the bite area.  The bite area feels warm.  You have a fever. Get help right away if:  You have joint pain.  You  have a rash.  You have shortness of breath.  You feel more tired or sleepy than you normally do.  You have neck pain.  You have a headache.  You feel weaker than you normally do.  You have chest pain.  You have pain in your belly.  You feel sick to your stomach (nauseous) or you throw up (vomit). Summary  An insect bite can make your skin red, itchy, and swollen.  Do not scratch the bite area, and keep it clean and dry.  Ice can help with pain and itching from the bite. This information is not intended to replace advice given to you by your health care provider. Make sure you discuss any questions you have with your health care provider. Document Released: 07/04/2000 Document Revised: 02/07/2016 Document Reviewed: 11/22/2014 Elsevier Interactive Patient Education  2018 [name], Adult A blister is a raised bubble of skin filled with liquid. Blisters often develop in an area of the skin that repeatedly rubs or presses against another surface (friction blister). Friction blisters can occur on any part of the body, but they usually develop on the hands or feet. Long-term pressure on the same area of the skin can also lead to areas of hardened skin (calluses). What are the causes? A blister can be caused by:  An injury.  A  burn.  An allergic reaction.  An infection.  Exposure to irritating chemicals.  Friction, especially in an area with a lot of heat and moisture.  Friction blisters often result from:  Sports.  Repetitive activities.  Using tools and doing other activities without wearing gloves.  Shoes that are too tight or too loose.  What are the signs or symptoms? A blister is often round and looks like a bump. It may:  Itch.  Be painful to the touch.  Before a blister forms, the skin may:  Become red.  Feel warm.  Itch.  Be painful to the touch.  How is this diagnosed? A blister is diagnosed with a physical exam. How is this  treated? Treatment usually involves protecting the area where the blister has formed until the skin has healed. Other treatments may include:  A bandage (dressing) to cover the blister.  Extra padding around and over the blister, so that it does not rub on anything.  Antibiotic ointment.  Most blisters break open, dry up, and go away on their own within 1-2 weeks. Blisters that are very painful may be drained before they break open on their own. If the blister is large or painful, it can be drained by: 1. Sterilizing a small needle with rubbing alcohol. 2. Washing your hands with soap and water. 3. Inserting the needle in the edge of the blister to make a small hole. Some fluid will drain out of the hole. Let the top or roof of the blister stay in place. This helps the skin heal. 4. Washing the blister with mild soap and water. 5. Covering the blister with antibiotic ointment, if prescribed by your health care provider, and a dressing.  Some blisters may need to be drained by a health care provider. Follow these instructions at home:  Protect the area where the blister has formed as told by your health care provider.  Keep your blister clean and dry. This helps to prevent infection.  Do not pop your blister. This can cause infection.  If you were prescribed an antibiotic, use it as told by your health care provider. Do not stop using the antibiotic even if your condition improves.  Wear different shoes until the blister heals.  Avoid the activity that caused the blister until your blister heals.  Check your blister every day for signs of infection. Check for: ? More redness, swelling, or pain. ? More fluid or blood. ? Warmth. ? Pus or a bad smell. ? The blister getting better and then getting worse. How is this prevented? Taking these steps can help to prevent blisters that are caused by friction. Make sure you:  Wear comfortable shoes that fit well.  Always wear socks with  shoes.  Wear extra socks or use tape, bandages, or pads over blister-prone areas as needed. You may also apply petroleum jelly under bandages in blister-prone areas.  Wear protective gear, such as gloves, when participating in sports or activities that can cause blisters.  Wear loose-fitting, moisture-wicking clothes when participating in sports or activities.  Use powders as needed to keep your feet dry.  Contact a health care provider if:  You have more redness, swelling, or pain around your blister.  You have more fluid or blood coming from your blister.  Your blister feels warm to the touch.  You have pus or a bad smell coming from your blister.  You have a fever or chills.  Your blister gets better and then it gets  worse. This information is not intended to replace advice given to you by your health care provider. Make sure you discuss any questions you have with your health care provider. Document Released: 08/14/2004 Document Revised: 03/05/2016 Document Reviewed: 01/18/2016 Elsevier Interactive Patient Education  2018 [employer].

## 2018-01-25 NOTE — Progress Notes (Signed)
Chief Complaint  Patient presents with  . Follow-up   F/u with wife  1. Cellulitis and blisters to right foot and ankle completed levaquin and doxy and bactroban on area doing better not complaints except mild residual swelling w/o pain. Wife reports after last visit more blisters came and she sterilized needle and popped them    Review of Systems  Respiratory: Negative for shortness of breath.   Cardiovascular: Positive for leg swelling. Negative for chest pain.  Skin: Negative for rash.   Past Medical History:  Diagnosis Date  . Anxiety   . Arthritis   . Atrial fibrillation ([place])    a. Dx 2013, recurred 02/2014, CHA2DS2VASc = 3 -->placed on Eliquis;  b. 02/2014 Echo: EF 50-55%, mid and apical anterior septum and mid and apical inf septum are abnl, mild to mod Ao sclerosis w/o AS.  Marland Kitchen Chicken pox   . Chronic lymphocytic leukemia ([place])    a. Dx 02/2014.  Marland Kitchen Complication of anesthesia    History of  PTSD--do not touch patient when waking up from surgery.  Marland Kitchen COPD (chronic obstructive pulmonary disease) ([place])   . Coronary artery disease    a. 04/2009 CABG x 3 (LIMA->LAD, VG->OM1, VG->PDA);  b. 09/2009 Cath: occluded VG x 2 w/ patent LIMA and L->R collats. EF 55%, mild antlat HK;  c. 10/2011 MV: EF 53%, no isch/infarct-->low risk.  Marland Kitchen Dysrhythmia    hx of a-fib  . GERD (gastroesophageal reflux disease)    occasional  . History of chemotherapy 2015-2016  . HOH (hard of hearing)    Bilateral Hearing Aids  . Hypertension   . Myocardial infarction ([place]) 2010  . OSA on CPAP    USE C-PAP  . Presence of permanent cardiac pacemaker 2017  . PTSD (post-traumatic stress disorder)   . PTSD (post-traumatic stress disorder)   . Pure hypercholesterolemia   . Rheumatic fever 1959   Past Surgical History:  Procedure Laterality Date  . ABDOMINAL HERNIA REPAIR    . APPENDECTOMY  06/21/1985  . CARDIAC CATHETERIZATION  2010; 2011   ; Dr Fletcher Anon  . CORONARY ARTERY BYPASS GRAFT  04/2009   "CABG X3"  .  EP IMPLANTABLE DEVICE N/A 03/03/2016   Procedure: Pacemaker Implant;  Surgeon: Deboraha Sprang, MD;  Location: [facility];  Service: Cardiovascular;  Laterality: N/A;  . FOREIGN BODY REMOVAL  1968   "shrapnel in my tailbone"  . INGUINAL HERNIA REPAIR Right   . INSERT / REPLACE / REMOVE PACEMAKER    . JOINT REPLACEMENT Right 2018  . LAPAROSCOPIC CHOLECYSTECTOMY    . TONSILLECTOMY AND ADENOIDECTOMY  1956  . TOTAL HIP ARTHROPLASTY Right 10/22/2016   Procedure: TOTAL HIP ARTHROPLASTY;  Surgeon: Dereck Leep, MD;  Location: [facility];  Service: Orthopedics;  Laterality: Right;  . TOTAL HIP ARTHROPLASTY Left 11/04/2017   Procedure: TOTAL HIP ARTHROPLASTY;  Surgeon: Dereck Leep, MD;  Location: [facility];  Service: Orthopedics;  Laterality: Left;   Family History  Problem Relation Age of Onset  . Heart disease Mother   . Coronary artery disease Unknown        family history   Social History   Socioeconomic History  . Marital status: Married    Spouse name: Not on file  . Number of children: Not on file  . Years of education: Not on file  . Highest education level: Not on file  Occupational History  . 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 Financial resource strain: Not on file  . Food insecurity:    Worry: Not on file    Inability: Not on file  . Transportation needs:    Medical: Not on file    Non-medical: Not on file  Tobacco Use  . Smoking status: Former Smoker    Packs/day: 1.00    Years: 40.00    Pack years: 40.00    Types: Cigarettes    Last attempt to quit: 07/21/2006    Years since quitting: 11.5  . Smokeless tobacco: Never Used  Substance and Sexual Activity  . Alcohol use: Yes    Alcohol/week: 0.6 oz    Types: 1 Standard drinks or equivalent per week    Comment: rarely  . Drug use: No  . Sexual activity: Not Currently  Lifestyle  . Physical activity:    Days per week: Not on file     Minutes per session: Not on file  . Stress: Not on file  Relationships  . Social connections:    Talks on phone: Not on file    Gets together: Not on file    Attends religious service: Not on file    Active member of club or organization: Not on file    Attends meetings of clubs or organizations: Not on file    Relationship status: Not on file  . Intimate partner violence:    Fear of current or ex partner: Not on file    Emotionally abused: Not on file    Physically abused: Not on file    Forced sexual activity: Not on file  Other Topics Concern  . Not on file  Social History Narrative   Lives in [place] with wife. Dogs. Goats. Children - 4. Grandchildren - 7.      Worked - Sports coach, part time.   Diet - healthy   Exercise - very active      Service - [employer] - [place]   Current Meds  Medication Sig  . acetaminophen (TYLENOL) 500 MG tablet Take 1,000 mg by mouth every 8 (eight) hours as needed for mild pain.  Marland Kitchen albuterol (PROVENTIL HFA;VENTOLIN HFA) 108 (90 Base) MCG/ACT inhaler Inhale 2 puffs into the lungs every 6 (six) hours as needed for wheezing or shortness of breath.  Marland Kitchen amiodarone (PACERONE) 200 MG tablet Take 0.5 tablets (100 mg total) by mouth 2 (two) times daily.  Marland Kitchen apixaban (ELIQUIS) 5 MG TABS tablet Take 1 tablet (5 mg total) by mouth 2 (two) times daily.  Marland Kitchen atorvastatin (LIPITOR) 80 MG tablet Take 1 tablet (80 mg total) by mouth at bedtime.  . budesonide-formoterol (SYMBICORT) 160-4.5 MCG/ACT inhaler Inhale 2 puffs into the lungs 2 (two) times daily.  . cetirizine (ZYRTEC) 10 MG tablet Take 10 mg by mouth daily as needed for allergies.   . Coenzyme Q10 (COQ10) 200 MG CAPS Take 200 mg by mouth daily.  Marland Kitchen ezetimibe (ZETIA) 10 MG tablet Take 1 tablet (10 mg total) by mouth daily.  Javier Docker Oil 350 MG CAPS Take 350 mg by mouth every evening.  . metoprolol tartrate (LOPRESSOR) 25 MG tablet Take 1 tablet (25 mg total) by mouth 2 (two) times daily as needed. (Patient  taking differently: Take 25 mg by mouth 2 (two) times daily. )  . mirtazapine (REMERON) 15 MG tablet Take 15 mg by mouth at bedtime as needed (for panic associated with PTSD).   . Multiple Vitamin (MULTIVITAMIN WITH MINERALS) TABS  tablet Take 1 tablet by mouth daily.  . mupirocin ointment (BACTROBAN) 2 % Apply 1 application topically 3 (three) times daily.  . nitroGLYCERIN (NITROSTAT) 0.4 MG SL tablet Place 1 tablet (0.4 mg total) under the tongue every 5 (five) minutes as needed for chest pain.  Marland Kitchen tiotropium (SPIRIVA) 18 MCG inhalation capsule Place 1 capsule (18 mcg total) into inhaler and inhale at bedtime.  . traMADol (ULTRAM) 50 MG tablet TAKE 1 TABLET BY MOUTH EVERY 12 HOURS AS NEEDED FOR MODERATE PAIN  . triamcinolone cream (KENALOG) 0.1 % Apply 1 application topically 2 (two) times daily. Apply to AA.  . [DISCONTINUED] levofloxacin (LEVAQUIN) 500 MG tablet Take 1 tablet (500 mg total) by mouth daily.  . [DISCONTINUED] mupirocin ointment (BACTROBAN) 2 % Apply 1 application topically 3 (three) times daily.   No Known Allergies Recent Results (from the past 2160 hour(s))  Protime-INR     Status: None   Collection Time: 11/04/17  6:58 AM  Result Value Ref Range   Prothrombin Time 14.2 11.4 - 15.2 seconds   INR 1.11     Comment: Performed at [facility], [address] [place], [place] [address]  Surgical pathology     Status: None   Collection Time: 11/04/17  9:17 AM  Result Value Ref Range   SURGICAL PATHOLOGY      Surgical Pathology CASE: [id number] PATIENT: Britt Boozer Surgical Pathology Report     SPECIMEN SUBMITTED: A. Femoral head, left  CLINICAL HISTORY: Known diagnosis of CLL, recent WBC 24,000  PRE-OPERATIVE DIAGNOSIS: Osteoarthritis left hip  POST-OPERATIVE DIAGNOSIS: None provided.     DIAGNOSIS: A. 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 - OSTEOARTHROSIS.  GROSS  DESCRIPTION: A. Labeled: Left femoral head Received: In formalin Size of specimen:      Head - 5.1 x 5.1 cm      Neck - 4.2 x 3.2 cm Articular surface: Tan-brown with focal granularity Cut surface: tan-brown Other findings: No additional noted  Block summary: 1 - representative tissue  Tissue decalcification: Yes   Final Diagnosis performed by Quay Burow, MD.   Electronically signed 11/09/2017 2:07:58PM The electronic signature indicates that the named Attending Pathologist has evaluated the specimen  Technical co mponent performed at [facility], [address] [place], [place] [address] Lab: [phone] Dir: Rush Farmer, MD, MMM  Professional component performed at [facility], [facility], [place], [place], [place] [address] Lab: [phone] Dir: Dellia Nims. Reuel Derby, MD   CUP PACEART REMOTE DEVICE CHECK     Status: None   Collection Time: 11/17/17 12:56 PM  Result Value Ref Range   Date Time Interrogation Session [id number]    Pulse Generator Manufacturer MERM    Pulse Gen Model [id number] Advisa DR MRI    Pulse Gen Serial Number [id number] [facility]    Clinic Name [facility]    Implantable Pulse Generator Type Implantable Pulse Generator    Implantable Pulse Generator Implant Date 04888916    Implantable Lead Manufacturer [facility]    Implantable Lead Model 5076 CapSureFix Novus MRI SureScan    Implantable Lead Serial Number [id number]    Implantable Lead Implant Date 00349179    Implantable Lead Location Detail 1 UNKNOWN    Implantable Lead Location [id number]    Implantable Lead Manufacturer [facility]    Implantable Lead Model 5076 CapSureFix Novus MRI SureScan    Implantable Lead Serial Number [id number]    Implantable Lead Implant Date 15056979  Implantable Lead Location Detail 1 UNKNOWN    Implantable Lead Location [phone]    Lead Channel Setting Sensing Sensitivity 0.9 mV   Lead Channel Setting Pacing Amplitude 2 V   Lead Channel Setting Pacing Pulse  Width 0.4 ms   Lead Channel Setting Pacing Amplitude 2.5 V   Lead Channel Impedance Value 456 ohm   Lead Channel Impedance Value 323 ohm   Lead Channel Sensing Intrinsic Amplitude 3.125 mV   Lead Channel Sensing Intrinsic Amplitude 3.125 mV   Lead Channel Pacing Threshold Amplitude 0.875 V   Lead Channel Pacing Threshold Pulse Width 0.4 ms   Lead Channel Impedance Value 551 ohm   Lead Channel Impedance Value 456 ohm   Lead Channel Sensing Intrinsic Amplitude 20.625 mV   Lead Channel Sensing Intrinsic Amplitude 20.625 mV   Lead Channel Pacing Threshold Amplitude 1 V   Lead Channel Pacing Threshold Pulse Width 0.4 ms   Battery Status OK    Battery Remaining Longevity 96 mo   Battery Voltage 3.01 V   Brady Statistic RA Percent Paced 95.42 %   Brady Statistic RV Percent Paced 0.14 %   Brady Statistic AP VP Percent 0.14 %   Brady Statistic AS VP Percent 0 %   Brady Statistic AP VS Percent 95.29 %   Brady Statistic AS VS Percent 4.57 %   Eval Rhythm ApVs   Lactate dehydrogenase     Status: None   Collection Time: 11/19/17 11:41 AM  Result Value Ref Range   LDH 168 98 - 192 U/L    Comment: Performed at [facility], [address] [place], [place] [address]  Comprehensive metabolic panel     Status: Abnormal   Collection Time: 11/19/17 11:41 AM  Result Value Ref Range   Sodium 137 135 - 145 mmol/L   Potassium 4.3 3.5 - 5.1 mmol/L   Chloride 103 101 - 111 mmol/L   CO2 25 22 - 32 mmol/L   Glucose, Bld 118 (H) 65 - 99 mg/dL   BUN 18 6 - 20 mg/dL   Creatinine, Ser 0.97 0.61 - 1.24 mg/dL   Calcium 8.9 8.9 - 10.3 mg/dL   Total Protein 6.8 6.5 - 8.1 g/dL   Albumin 3.6 3.5 - 5.0 g/dL   AST 24 15 - 41 U/L   ALT 30 17 - 63 U/L   Alkaline Phosphatase 111 38 - 126 U/L   Total Bilirubin 0.9 0.3 - 1.2 mg/dL   GFR calc non Af Amer >60 >60 mL/min   GFR calc Af Amer >60 >60 mL/min    Comment: (NOTE) The eGFR has been calculated using the CKD EPI equation. This calculation has not  been validated in all clinical situations. eGFR's persistently <60 mL/min signify possible Chronic Kidney Disease.    Anion gap 9 5 - 15    Comment: Performed at [facility], [address] [place], [place] [address]  CBC with Differential/Platelet     Status: Abnormal   Collection Time: 11/19/17 11:41 AM  Result Value Ref Range   WBC 28.4 (H) 3.8 - 10.6 K/uL   RBC 3.40 (L) 4.40 - 5.90 MIL/uL   Hemoglobin 11.3 (L) 13.0 - 18.0 g/dL   HCT 33.3 (L) 40.0 - 52.0 %   MCV 97.7 80.0 - 100.0 fL   MCH 33.2 26.0 - 34.0 pg   MCHC 34.0 32.0 - 36.0 g/dL   RDW 15.3 (H) 11.5 - 14.5 %   Platelets 324 150 - 440 K/uL  Neutrophils Relative % 17 %   Neutro Abs 4.8 1.4 - 6.5 K/uL   Lymphocytes Relative 79 %   Lymphs Abs 22.5 (H) 1.0 - 3.6 K/uL   Monocytes Relative 2 %   Monocytes Absolute 0.5 0.2 - 1.0 K/uL   Eosinophils Relative 2 %   Eosinophils Absolute 0.5 0 - 0.7 K/uL   Basophils Relative 0 %   Basophils Absolute 0.1 0 - 0.1 K/uL    Comment: Performed at [facility], [address] [place], [place] [address]  FISH HES [phone] REA     Status: None   Collection Time: 11/19/17 11:41 AM  Result Value Ref Range   Specimen Type Comment:     Comment: BLOOD   Cells Counted: Comment:     Comment: 100/PROBE   Cells Analyzed Comment:     Comment: 100/PROBE   FISH Result Comment:     Comment: (NOTE) 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 Results for CCND1/IGH, chromosome 12 and 13q were normal.      Loss of the ATM and TP53 signals are associated with the most adverse prognoses in patients diagnosed with CLL.    SPECIFIC FISH RESULTS:  ATM: ABNORMAL   .      nuc ish 11q22.3(ATMx1)[65/100]    TP53:  ABNORMAL      nuc ish 17p13.1(TP53x1)[53/100]    CCND1/IGH: NORMAL      nuc ish 11q13(CCND1x2),14q32(IGHx2)[100]  .  12cen: NORMAL     .      nuc ish 12cen(D12Z3x2)[100]    13q: NORMAL      nuc ish 13q14.3(DLEUx2),13q34(TFDP1x2)[100]      This analysis is limited to abnormalities detectable by the specific probes included in the study. FISH results should be interpreted within the context of a full cytogenetic analysis and hematologic evaluation.    REFERENCES:  Malek,(2013) Adv Exp Med Biol [phone].[id number]  .   Schnaiter et al.,(2011) Clin Lab Med [phone].WTUU#[id number]    .This test was developed and its performace characteristics determined by [employer] ([place]). It has not been cleared or approved by the U.S. Food and Drug Administration. The DNA probe vendor for this study was [employer], research (physical sciences)).    Director Review: Comment:     Comment: (NOTE) Perfecto [place], PHD, [place] Performed At: [facility] [place], [place] [id number] Nechama Guard MD VX:[phone] Performed At: [facility] [place], [place] [id number] Rush Farmer MD QG:[id number] Performed at [facility], [address], [place], [place] [address]   Lactate dehydrogenase     Status: None   Collection Time: 01/06/18 10:06 AM  Result Value Ref Range   LDH 147 98 - 192 U/L    Comment: Performed at [facility], [address] [place], [place] [address]  CBC with Differential     Status: Abnormal   Collection Time: 01/06/18 10:06 AM  Result Value Ref Range   WBC 28.4 (H) 3.8 - 10.6 K/uL   RBC 3.56 (L) 4.40 - 5.90 MIL/uL   Hemoglobin 11.8 (L) 13.0 - 18.0 g/dL   HCT 35.0 (L) 40.0 - 52.0 %   MCV 98.4 80.0 - 100.0 fL  MCH 33.1 26.0 - 34.0 pg   MCHC 33.7 32.0 - 36.0 g/dL   RDW 15.8 (H) 11.5 - 14.5 %   Platelets 147 (L) 150 - 440 K/uL   Neutrophils Relative % 13 %   Neutro Abs 3.8 1.4 -  6.5 K/uL   Lymphocytes Relative 83 %   Lymphs Abs 23.6 (H) 1.0 - 3.6 K/uL   Monocytes Relative 1 %   Monocytes Absolute 0.4 0.2 - 1.0 K/uL   Eosinophils Relative 2 %   Eosinophils Absolute 0.5 0 - 0.7 K/uL   Basophils Relative 1 %   Basophils Absolute 0.2 (H) 0 - 0.1 K/uL    Comment: Performed at [facility], [address] [place], [place] [address]  Comprehensive metabolic panel     Status: Abnormal   Collection Time: 01/06/18 10:06 AM  Result Value Ref Range   Sodium 139 135 - 145 mmol/L   Potassium 4.4 3.5 - 5.1 mmol/L   Chloride 110 101 - 111 mmol/L   CO2 22 22 - 32 mmol/L   Glucose, Bld 118 (H) 65 - 99 mg/dL   BUN 20 6 - 20 mg/dL   Creatinine, Ser 1.08 0.61 - 1.24 mg/dL   Calcium 8.8 (L) 8.9 - 10.3 mg/dL   Total Protein 6.3 (L) 6.5 - 8.1 g/dL   Albumin 3.8 3.5 - 5.0 g/dL   AST 17 15 - 41 U/L   ALT 15 (L) 17 - 63 U/L   Alkaline Phosphatase 80 38 - 126 U/L   Total Bilirubin 0.8 0.3 - 1.2 mg/dL   GFR calc non Af Amer >60 >60 mL/min   GFR calc Af Amer >60 >60 mL/min    Comment: (NOTE) The eGFR has been calculated using the CKD EPI equation. This calculation has not been validated in all clinical situations. eGFR's persistently <60 mL/min signify possible Chronic Kidney Disease.    Anion gap 7 5 - 15    Comment: Performed at [facility], [address] [place], [place] [address]   Objective  Body mass index is 31.66 kg/m. Wt Readings from Last 3 Encounters:  01/25/18 214 lb 6.4 oz (97.3 kg)  01/18/18 215 lb (97.5 kg)  01/14/18 219 lb 6.4 oz (99.5 kg)   Temp Readings from Last 3 Encounters:  01/25/18 97.6 F (36.4 C)  01/14/18 (!) 97.4 F (36.3 C) (Oral)  01/06/18 97.9 F (36.6 C) (Tympanic)   BP Readings from Last 3 Encounters:  01/25/18 130/60  01/18/18 138/78  01/14/18 122/70   Pulse Readings from Last 3 Encounters:  01/25/18 86  01/18/18 79  01/14/18 67    Physical Exam  Constitutional: He appears well-developed and  well-nourished.  HENT:  Head: Normocephalic and atraumatic.  Mouth/Throat: Oropharynx is clear and moist.  Eyes: Pupils are equal, round, and reactive to light. Conjunctivae are normal.  Cardiovascular: Normal rate, regular rhythm and normal heart sounds.  Pulmonary/Chest: Effort normal and breath sounds normal.  Neurological: He is alert.  Skin: Skin is warm and dry.  Healed blisters to right foot ankle with dried skin peeling now  Mild edema right foot  Psychiatric: He has a normal mood and affect. His behavior is normal. Judgment and thought content normal.  Nursing note and vitals reviewed.   Assessment   1. Blisters/cellulitis right ankle/foot s/p insect bite, improved  Plan  1.  Continue bactroban, elevation of foot for swelling  Done with levaquin and doxy  No swimming x 2 weeks  Provider: Dr.  Olivia Mackie McLean-Scocuzza-Internal Medicine

## 2018-02-09 ENCOUNTER — Ambulatory Visit: Payer: Medicare HMO | Admitting: Internal Medicine

## 2018-02-09 ENCOUNTER — Encounter: Payer: Self-pay | Admitting: Internal Medicine

## 2018-02-09 VITALS — BP 156/90 | HR 83 | Ht 69.0 in | Wt 214.0 lb

## 2018-02-09 DIAGNOSIS — Z950 Presence of cardiac pacemaker: Secondary | ICD-10-CM | POA: Diagnosis not present

## 2018-02-09 DIAGNOSIS — Z79899 Other long term (current) drug therapy: Secondary | ICD-10-CM

## 2018-02-09 DIAGNOSIS — I480 Paroxysmal atrial fibrillation: Secondary | ICD-10-CM

## 2018-02-09 DIAGNOSIS — I495 Sick sinus syndrome: Secondary | ICD-10-CM

## 2018-02-09 NOTE — Progress Notes (Signed)
Patient Care Team: Leone Haven, MD as PCP - General (Family Medicine) Rockey Situ Kathlene November, MD as Consulting Physician (Cardiology)   HPI  [name] is a 72 y.o. male Seen in follow-up for atrial fibrillation managed with ELIQUIS and amiodarone. He was seen in August 2017 with significant sinus bradycardia and underwent pacing received an MRI compatible device He also has coronary artery disease with prior bypass surgery  The patient denies chest pain,  nocturnal dyspnea, orthopnea or peripheral edema.  There have been no palpitations, lightheadedness or syncope.  He notes that when he walks down to the pons and backup that he is quite short of breath. When he sits in his chair his heart rate is about 100 bpm.  Patient denies symptoms of GI intolerance, sun sensitivity, neurological symptoms attributable to amiodarone.  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  There have been no palpitations, lightheadedness or syncope.  He has chronic stable SOB  Patient denies symptoms of GI intolerance, sun sensitivity, neurological symptoms attributable to amiodarone.    Past Medical History:  Diagnosis Date  . Anxiety   . Arthritis   . Atrial fibrillation ([place])    a. Dx 2013, recurred 02/2014, CHA2DS2VASc = 3 -->placed on Eliquis;  b. 02/2014 Echo: EF 50-55%, mid and apical anterior septum and mid and apical inf septum are abnl, mild to mod Ao sclerosis w/o AS.  Marland Kitchen Chicken pox   . Chronic lymphocytic leukemia ([place])    a. Dx 02/2014.  Marland Kitchen Complication of anesthesia    History of  PTSD--do not touch patient when waking up from surgery.  Marland Kitchen  COPD (chronic obstructive pulmonary disease) ([place])   . Coronary artery disease    a. 04/2009 CABG x 3 (LIMA->LAD, VG->OM1, VG->PDA);  b. 09/2009 Cath: occluded VG x 2 w/ patent LIMA and L->R collats. EF 55%, mild antlat HK;  c. 10/2011 MV: EF 53%, no isch/infarct-->low risk.  Marland Kitchen Dysrhythmia    hx of a-fib  . GERD (gastroesophageal reflux disease)    occasional  . History of chemotherapy 2015-2016  . HOH (hard of hearing)    Bilateral Hearing Aids  . Hypertension   . Myocardial infarction ([place]) 2010  . OSA on CPAP    USE C-PAP  . Presence of permanent cardiac pacemaker 2017  . PTSD (post-traumatic stress disorder)   . PTSD (post-traumatic stress disorder)   . Pure hypercholesterolemia   . Rheumatic fever 1959    Past Surgical History:  Procedure Laterality Date  . ABDOMINAL HERNIA REPAIR    . APPENDECTOMY  06/21/1985  . CARDIAC CATHETERIZATION  2010; 2011   ; Dr Fletcher Anon  . CORONARY ARTERY BYPASS GRAFT  04/2009   "CABG X3"  . EP IMPLANTABLE DEVICE N/A 03/03/2016   Procedure: Pacemaker Implant;  Surgeon: Deboraha Sprang, MD;  Location: [facility];  Service: Cardiovascular;  Laterality: N/A;  . FOREIGN BODY REMOVAL  1968   "  shrapnel in my tailbone"  . INGUINAL HERNIA REPAIR Right   . INSERT / REPLACE / REMOVE PACEMAKER    . JOINT REPLACEMENT Right 2018  . LAPAROSCOPIC CHOLECYSTECTOMY    . TONSILLECTOMY AND ADENOIDECTOMY  1956  . TOTAL HIP ARTHROPLASTY Right 10/22/2016   Procedure: TOTAL HIP ARTHROPLASTY;  Surgeon: Dereck Leep, MD;  Location: [facility];  Service: Orthopedics;  Laterality: Right;  . TOTAL HIP ARTHROPLASTY Left 11/04/2017   Procedure: TOTAL HIP ARTHROPLASTY;  Surgeon: Dereck Leep, MD;  Location: [facility];  Service: Orthopedics;  Laterality: Left;    Current Outpatient Medications  Medication Sig Dispense Refill  . acetaminophen (TYLENOL) 500 MG tablet Take 1,000 mg by mouth every 8 (eight) hours as needed for mild pain.    Marland Kitchen albuterol (PROVENTIL  HFA;VENTOLIN HFA) 108 (90 Base) MCG/ACT inhaler Inhale 2 puffs into the lungs every 6 (six) hours as needed for wheezing or shortness of breath. 1 Inhaler 11  . amiodarone (PACERONE) 200 MG tablet Take 0.5 tablets (100 mg total) by mouth 2 (two) times daily. 30 tablet 3  . apixaban (ELIQUIS) 5 MG TABS tablet Take 1 tablet (5 mg total) by mouth 2 (two) times daily. 180 tablet 3  . atorvastatin (LIPITOR) 80 MG tablet Take 1 tablet (80 mg total) by mouth at bedtime. 90 tablet 3  . budesonide-formoterol (SYMBICORT) 160-4.5 MCG/ACT inhaler Inhale 2 puffs into the lungs 2 (two) times daily. 1 Inhaler 11  . cetirizine (ZYRTEC) 10 MG tablet Take 10 mg by mouth daily as needed for allergies.     . Coenzyme Q10 (COQ10) 200 MG CAPS Take 200 mg by mouth daily.    Marland Kitchen ezetimibe (ZETIA) 10 MG tablet Take 1 tablet (10 mg total) by mouth daily. 90 tablet 3  . Krill Oil 350 MG CAPS Take 350 mg by mouth every evening.    . metoprolol tartrate (LOPRESSOR) 25 MG tablet Take 1 tablet (25 mg total) by mouth 2 (two) times daily as needed. (Patient taking differently: Take 25 mg by mouth 2 (two) times daily. ) 180 tablet 3  . mirtazapine (REMERON) 15 MG tablet Take 15 mg by mouth at bedtime as needed (for panic associated with PTSD).     . Multiple Vitamin (MULTIVITAMIN WITH MINERALS) TABS tablet Take 1 tablet by mouth daily.    . mupirocin ointment (BACTROBAN) 2 % Apply 1 application topically 3 (three) times daily. 30 g 1  . nitroGLYCERIN (NITROSTAT) 0.4 MG SL tablet Place 1 tablet (0.4 mg total) under the tongue every 5 (five) minutes as needed for chest pain. 25 tablet 6  . tiotropium (SPIRIVA) 18 MCG inhalation capsule Place 1 capsule (18 mcg total) into inhaler and inhale at bedtime. 30 capsule 11  . traMADol (ULTRAM) 50 MG tablet TAKE 1 TABLET BY MOUTH EVERY 12 HOURS AS NEEDED FOR MODERATE PAIN 90 tablet 0  . triamcinolone cream (KENALOG) 0.1 % Apply 1 application topically 2 (two) times daily. Apply to AA. 30 g 0    No current facility-administered medications for this visit.     No Known Allergies    Review of Systems negative except from HPI and PMH  Physical Exam BP (!) 156/90 (BP Location: Left Arm, Patient Position: Sitting, Cuff Size: Normal)   Pulse 83   Ht 5\' 9"  (1.753 m)   Wt 214 lb (97.1 kg)   BMI 31.60 kg/m  Well developed and nourished in no acute distress HENT normal Neck supple with JVP-flat Clear Regular rate  and rhythm, no murmurs or gallops Abd-soft with active BS No Clubbing cyanosis edema Skin-warm and dry A & Oriented  Grossly normal sensory and motor function   ECG APacing 83 30/10/44  Assessment and  Plan  Sinus node dysfunction With chronotropic incompetence  Paroxysmal atrial fibrillation  CAD s/p CABG  High Risk Medication Surveillance  Dyspnea on exertion  Pacemaker-[employer]  Hypertension  CLL recurring   No intercurrent atrial fibrillation or flutter  On Anticoagulation;  No bleeding issues   Without symptoms of ischemia  BP elevated-- will have him check at home  Tolerating amio, will check TSH  Good HR excursion w device        Current medicines are reviewed at length with the patient today .  The patient does not  have concerns regarding medicines.

## 2018-02-09 NOTE — Patient Instructions (Signed)
Medication Instructions: - Your physician recommends that you continue on your current medications as directed. Please refer to the Current Medication list given to you today.  Labwork: - Your physician recommends that you have lab work with your next lab draw: TSH (please take the order given to you today when you go)    Procedures/Testing: - none ordered  Follow-Up: - Remote monitoring is used to monitor your Pacemaker of ICD from home. This monitoring reduces the number of office visits required to check your device to one time per year. It allows [place] to keep an eye on the functioning of your device to ensure it is working properly. You are scheduled for a device check from home on 02/16/18. You may send your transmission at any time that day. If you have a wireless device, the transmission will be sent automatically. After your physician reviews your transmission, you will receive a postcard with your next transmission date.  - Your physician wants you to follow-up in: 6 months with Dr. Caryl Comes. You will receive a reminder letter in the mail two months in advance. If you don't receive a letter, please call our office to schedule the follow-up appointment.   Any Additional Special Instructions Will Be Listed Below (If Applicable).     If you need a refill on your cardiac medications before your next appointment, please call your pharmacy.

## 2018-02-10 LAB — CUP PACEART INCLINIC DEVICE CHECK
Battery Remaining Longevity: 95 mo
Battery Voltage: 3.01 V
Brady Statistic AS VS Percent: 2.11 %
Implantable Lead Implant Date: 20170814
Implantable Lead Location: 753860
Implantable Lead Model: 5076
Implantable Pulse Generator Implant Date: 20170814
Lead Channel Impedance Value: 342 Ohm
Lead Channel Impedance Value: 456 Ohm
Lead Channel Pacing Threshold Amplitude: 0.75 V
Lead Channel Pacing Threshold Amplitude: 1 V
Lead Channel Pacing Threshold Pulse Width: 0.4 ms
Lead Channel Sensing Intrinsic Amplitude: 2.75 mV
Lead Channel Setting Pacing Amplitude: 2.5 V
Lead Channel Setting Pacing Pulse Width: 0.4 ms
MDC IDC LEAD IMPLANT DT: 20170814
MDC IDC LEAD LOCATION: 753859
MDC IDC MSMT LEADCHNL RA PACING THRESHOLD PULSEWIDTH: 0.4 ms
MDC IDC MSMT LEADCHNL RV IMPEDANCE VALUE: 475 Ohm
MDC IDC MSMT LEADCHNL RV IMPEDANCE VALUE: 570 Ohm
MDC IDC MSMT LEADCHNL RV SENSING INTR AMPL: 23.625 mV
MDC IDC SESS DTM: 20190723141431
MDC IDC SET LEADCHNL RA PACING AMPLITUDE: 2 V
MDC IDC SET LEADCHNL RV SENSING SENSITIVITY: 0.9 mV
MDC IDC STAT BRADY AP VP PERCENT: 0.14 %
MDC IDC STAT BRADY AP VS PERCENT: 97.74 %
MDC IDC STAT BRADY AS VP PERCENT: 0 %
MDC IDC STAT BRADY RA PERCENT PACED: 97.88 %
MDC IDC STAT BRADY RV PERCENT PACED: 0.15 %

## 2018-02-12 ENCOUNTER — Other Ambulatory Visit: Payer: Medicare HMO

## 2018-02-12 ENCOUNTER — Ambulatory Visit: Payer: Medicare HMO | Admitting: Internal Medicine

## 2018-02-16 ENCOUNTER — Ambulatory Visit (INDEPENDENT_AMBULATORY_CARE_PROVIDER_SITE_OTHER): Payer: Medicare HMO | Admitting: *Deleted

## 2018-02-16 DIAGNOSIS — I495 Sick sinus syndrome: Secondary | ICD-10-CM | POA: Diagnosis not present

## 2018-02-16 NOTE — Progress Notes (Signed)
Remote pacemaker transmission.   

## 2018-02-17 ENCOUNTER — Encounter: Payer: Self-pay | Admitting: Cardiology

## 2018-02-17 ENCOUNTER — Other Ambulatory Visit: Payer: Self-pay | Admitting: *Deleted

## 2018-02-17 DIAGNOSIS — C9110 Chronic lymphocytic leukemia of B-cell type not having achieved remission: Secondary | ICD-10-CM

## 2018-02-18 ENCOUNTER — Inpatient Hospital Stay (HOSPITAL_BASED_OUTPATIENT_CLINIC_OR_DEPARTMENT_OTHER): Payer: Medicare HMO | Admitting: Internal Medicine

## 2018-02-18 ENCOUNTER — Other Ambulatory Visit: Payer: Self-pay

## 2018-02-18 ENCOUNTER — Inpatient Hospital Stay: Payer: Medicare HMO | Attending: Internal Medicine

## 2018-02-18 VITALS — BP 145/74 | HR 62 | Temp 98.1°F | Resp 16 | Wt 213.6 lb

## 2018-02-18 DIAGNOSIS — Z87891 Personal history of nicotine dependence: Secondary | ICD-10-CM | POA: Insufficient documentation

## 2018-02-18 DIAGNOSIS — R5383 Other fatigue: Secondary | ICD-10-CM

## 2018-02-18 DIAGNOSIS — M545 Low back pain: Secondary | ICD-10-CM | POA: Diagnosis not present

## 2018-02-18 DIAGNOSIS — Z79899 Other long term (current) drug therapy: Secondary | ICD-10-CM | POA: Diagnosis not present

## 2018-02-18 DIAGNOSIS — M2550 Pain in unspecified joint: Secondary | ICD-10-CM | POA: Diagnosis not present

## 2018-02-18 DIAGNOSIS — I4891 Unspecified atrial fibrillation: Secondary | ICD-10-CM | POA: Diagnosis not present

## 2018-02-18 DIAGNOSIS — G8929 Other chronic pain: Secondary | ICD-10-CM | POA: Insufficient documentation

## 2018-02-18 DIAGNOSIS — C9110 Chronic lymphocytic leukemia of B-cell type not having achieved remission: Secondary | ICD-10-CM

## 2018-02-18 DIAGNOSIS — Z7901 Long term (current) use of anticoagulants: Secondary | ICD-10-CM | POA: Insufficient documentation

## 2018-02-18 LAB — COMPREHENSIVE METABOLIC PANEL
ALT: 21 U/L (ref 0–44)
ANION GAP: 7 (ref 5–15)
AST: 21 U/L (ref 15–41)
Albumin: 4 g/dL (ref 3.5–5.0)
Alkaline Phosphatase: 76 U/L (ref 38–126)
BILIRUBIN TOTAL: 0.8 mg/dL (ref 0.3–1.2)
BUN: 20 mg/dL (ref 8–23)
CO2: 23 mmol/L (ref 22–32)
Calcium: 9 mg/dL (ref 8.9–10.3)
Chloride: 110 mmol/L (ref 98–111)
Creatinine, Ser: 0.96 mg/dL (ref 0.61–1.24)
GFR calc non Af Amer: >60 mL/min (ref >60)
Glucose, Bld: 113 mg/dL — ABNORMAL HIGH (ref 70–99)
POTASSIUM: 4.4 mmol/L (ref 3.5–5.1)
Sodium: 140 mmol/L (ref 135–145)
TOTAL PROTEIN: 6.7 g/dL (ref 6.5–8.1)

## 2018-02-18 LAB — CBC WITH DIFFERENTIAL/PLATELET
BASOS ABS: 0 10*3/uL (ref 0–0.1)
Basophils Relative: 0 %
EOS PCT: 1 %
Eosinophils Absolute: 0.3 10*3/uL (ref 0–0.7)
HCT: 36.2 % — ABNORMAL LOW (ref 40.0–52.0)
Hemoglobin: 11.9 g/dL — ABNORMAL LOW (ref 13.0–18.0)
LYMPHS PCT: 89 %
Lymphs Abs: 28.5 10*3/uL — ABNORMAL HIGH (ref 1.0–3.6)
MCH: 32.6 pg (ref 26.0–34.0)
MCHC: 32.7 g/dL (ref 32.0–36.0)
MCV: 99.4 fL (ref 80.0–100.0)
Monocytes Absolute: 0.3 10*3/uL (ref 0.2–1.0)
Monocytes Relative: 1 %
NEUTROS PCT: 9 %
Neutro Abs: 2.9 10*3/uL (ref 1.4–6.5)
PLATELETS: 141 10*3/uL — AB (ref 150–440)
RBC: 3.64 MIL/uL — AB (ref 4.40–5.90)
RDW: 16.4 % — ABNORMAL HIGH (ref 11.5–14.5)
WBC: 32 10*3/uL — AB (ref 4.0–10.5)

## 2018-02-18 LAB — LACTATE DEHYDROGENASE: LDH: 147 U/L (ref 98–192)

## 2018-02-18 LAB — TSH: TSH: 1.058 u[IU]/mL (ref 0.350–4.500)

## 2018-02-18 NOTE — Progress Notes (Signed)
[facility] OFFICE PROGRESS NOTE  Patient Care Team: Leone Haven, MD as PCP - General (Family Medicine) [name], MD as Consulting Physician (Cardiology)  Cancer Staging No matching staging information was found for the patient.   Oncology History   # AUG 2015- SLL/CLL [Right Ax Ln Bx] s/p Benda-Rituxan x6 [finished March 2016]; Maintenance Rituxan q 108m[started April 2016; Dr.Pandit];Last Ritux Jan 2017.  MARCH 2017- CT N/C/A/P- NED. 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  male pleasant patient above history of CLL currently on surveillance is here for follow-up.  Patient complains of worsening fatigue.  Denies any nausea vomiting.  Appetite is good.  No chest pain.  No cough.  Review of Systems  Constitutional: Positive for malaise/fatigue. Negative for chills, diaphoresis, fever and weight loss.  HENT: Negative for nosebleeds and sore throat.   Eyes: Negative for double vision.  Respiratory: Negative for cough, hemoptysis, sputum production, shortness of breath and wheezing.   Cardiovascular: Negative for chest pain, palpitations, orthopnea and leg swelling.  Gastrointestinal: Negative for abdominal pain, blood in stool, constipation, diarrhea, heartburn, melena, nausea and vomiting.  Genitourinary: Negative for dysuria, frequency and urgency.  Musculoskeletal: Positive for back pain and joint pain.  Skin: Negative.  Negative for itching and rash.  Neurological: Negative for dizziness, tingling, focal weakness, weakness and headaches.  Endo/Heme/Allergies: Does not bruise/bleed easily.  Psychiatric/Behavioral: Negative for depression. The patient is not nervous/anxious and does not have insomnia.       PAST MEDICAL  HISTORY :  Past Medical History:  Diagnosis Date  . Anxiety   . Arthritis   . Atrial fibrillation ([place]    a. Dx 2013, recurred 02/2014, CHA2DS2VASc = 3 -->placed on Eliquis;  b. 02/2014 Echo: EF 50-55%, mid and apical anterior septum and mid and apical inf septum are abnl, mild to mod Ao sclerosis w/o AS.  .Marland KitchenChicken pox   . Chronic lymphocytic leukemia ([place]    a. Dx 02/2014.  .Marland KitchenComplication of anesthesia    History of  PTSD--do not touch patient when waking up from surgery.  .Marland KitchenCOPD (chronic obstructive pulmonary disease) ([place]   . Coronary artery disease    a. 04/2009 CABG x 3 (LIMA->LAD, VG->OM1, VG->PDA);  b. 09/2009 Cath: occluded VG x 2 w/ patent LIMA and L->R collats. EF 55%, mild antlat HK;  c. 10/2011 MV: EF 53%, no isch/infarct-->low risk.  .Marland KitchenDysrhythmia    hx of a-fib  . GERD (gastroesophageal reflux disease)    occasional  . History of chemotherapy 2015-2016  . HOH (hard of hearing)    Bilateral Hearing Aids  . Hypertension   . Myocardial infarction ([place] 2010  . OSA on CPAP    USE C-PAP  . Presence of permanent cardiac pacemaker 2017  . PTSD (post-traumatic stress disorder)   . PTSD (post-traumatic stress disorder)   . Pure hypercholesterolemia   . Rheumatic fever 1959    PAST SURGICAL HISTORY :   Past Surgical History:  Procedure Laterality Date  . ABDOMINAL HERNIA REPAIR    . APPENDECTOMY  06/21/1985  . CARDIAC CATHETERIZATION  2010; 2011   ; Dr AFletcher Anon . CORONARY ARTERY BYPASS GRAFT  04/2009   "  CABG X3"  . EP IMPLANTABLE DEVICE N/A 03/03/2016   Procedure: Pacemaker Implant;  Surgeon: Deboraha Sprang, MD;  Location: [facility];  Service: Cardiovascular;  Laterality: N/A;  . FOREIGN BODY REMOVAL  1968   "shrapnel in my tailbone"  . INGUINAL HERNIA REPAIR Right   . INSERT / REPLACE / REMOVE PACEMAKER    . JOINT REPLACEMENT Right 2018  . LAPAROSCOPIC CHOLECYSTECTOMY    . TONSILLECTOMY AND ADENOIDECTOMY  1956  . TOTAL HIP ARTHROPLASTY Right 10/22/2016    Procedure: TOTAL HIP ARTHROPLASTY;  Surgeon: Dereck Leep, MD;  Location: [facility];  Service: Orthopedics;  Laterality: Right;  . TOTAL HIP ARTHROPLASTY Left 11/04/2017   Procedure: TOTAL HIP ARTHROPLASTY;  Surgeon: Dereck Leep, MD;  Location: [facility];  Service: Orthopedics;  Laterality: Left;    FAMILY HISTORY :   Family History  Problem Relation Age of Onset  . Heart disease Mother   . Coronary artery disease Unknown        family history    SOCIAL HISTORY:   Social History   Tobacco Use  . Smoking status: Former Smoker    Packs/day: 1.00    Years: 40.00    Pack years: 40.00    Types: Cigarettes    Last attempt to quit: 07/21/2006    Years since quitting: 11.6  . Smokeless tobacco: Never Used  Substance Use Topics  . Alcohol use: Yes    Alcohol/week: 1.0 standard drinks    Types: 1 Standard drinks or equivalent per week    Comment: rarely  . Drug use: No    ALLERGIES:  has No Known Allergies.  MEDICATIONS:  Current Outpatient Medications  Medication Sig Dispense Refill  . acetaminophen (TYLENOL) 500 MG tablet Take 1,000 mg by mouth every 8 (eight) hours as needed for mild pain.    Marland Kitchen albuterol (PROVENTIL HFA;VENTOLIN HFA) 108 (90 Base) MCG/ACT inhaler Inhale 2 puffs into the lungs every 6 (six) hours as needed for wheezing or shortness of breath. 1 Inhaler 11  . amiodarone (PACERONE) 200 MG tablet Take 0.5 tablets (100 mg total) by mouth 2 (two) times daily. 30 tablet 3  . apixaban (ELIQUIS) 5 MG TABS tablet Take 1 tablet (5 mg total) by mouth 2 (two) times daily. 180 tablet 3  . atorvastatin (LIPITOR) 80 MG tablet Take 1 tablet (80 mg total) by mouth at bedtime. 90 tablet 3  . budesonide-formoterol (SYMBICORT) 160-4.5 MCG/ACT inhaler Inhale 2 puffs into the lungs 2 (two) times daily. 1 Inhaler 11  . cetirizine (ZYRTEC) 10 MG tablet Take 10 mg by mouth daily as needed for allergies.     . Coenzyme Q10 (COQ10) 200 MG CAPS Take 200 mg by mouth daily.    Marland Kitchen ezetimibe  (ZETIA) 10 MG tablet Take 1 tablet (10 mg total) by mouth daily. 90 tablet 3  . Krill Oil 350 MG CAPS Take 350 mg by mouth every evening.    . metoprolol tartrate (LOPRESSOR) 25 MG tablet Take 1 tablet (25 mg total) by mouth 2 (two) times daily as needed. (Patient taking differently: Take 25 mg by mouth 2 (two) times daily. ) 180 tablet 3  . mirtazapine (REMERON) 15 MG tablet Take 15 mg by mouth at bedtime as needed (for panic associated with PTSD).     . Multiple Vitamin (MULTIVITAMIN WITH MINERALS) TABS tablet Take 1 tablet by mouth daily.    . mupirocin ointment (BACTROBAN) 2 % Apply 1 application topically 3 (three) times  daily. 30 g 1  . nitroGLYCERIN (NITROSTAT) 0.4 MG SL tablet Place 1 tablet (0.4 mg total) under the tongue every 5 (five) minutes as needed for chest pain. 25 tablet 6  . tiotropium (SPIRIVA) 18 MCG inhalation capsule Place 1 capsule (18 mcg total) into inhaler and inhale at bedtime. 30 capsule 11  . traMADol (ULTRAM) 50 MG tablet TAKE 1 TABLET BY MOUTH EVERY 12 HOURS AS NEEDED FOR MODERATE PAIN 90 tablet 0  . triamcinolone cream (KENALOG) 0.1 % Apply 1 application topically 2 (two) times daily. Apply to AA. 30 g 0   No current facility-administered medications for this visit.     PHYSICAL EXAMINATION: ECOG PERFORMANCE STATUS: 0 - Asymptomatic  BP (!) 145/74 (BP Location: Left Arm, Patient Position: Sitting)   Pulse 62   Temp 98.1 F (36.7 C)   Resp 16   Wt 213 lb 9.6 oz (96.9 kg)   BMI 31.54 kg/m   Filed Weights   02/18/18 1126  Weight: 213 lb 9.6 oz (96.9 kg)    GENERAL: Well-nourished well-developed; Alert, no distress and comfortable.  Accompanied by family.  EYES: no pallor or icterus OROPHARYNX: no thrush or ulceration; NECK: supple; no lymph nodes felt. LYMPH:  no palpable lymphadenopathy in the axillary or inguinal regions LUNGS: Decreased breath sounds auscultation bilaterally. No wheeze or crackles HEART/CVS: regular rate & rhythm and no murmurs;  No lower extremity edema ABDOMEN:abdomen soft, non-tender and normal bowel sounds.  Positive for splenomegaly. Musculoskeletal:no cyanosis of digits and no clubbing  PSYCH: alert & oriented x 3 with fluent speech NEURO: no focal motor/sensory deficits SKIN:  no rashes or significant lesions    LABORATORY DATA:  I have reviewed the data as listed    Component Value Date/Time   NA 140 02/18/2018 1051   NA 139 10/11/2014 1800   K 4.4 02/18/2018 1051   K 3.3 (L) 10/11/2014 1800   CL 110 02/18/2018 1051   CL 106 10/11/2014 1800   CO2 23 02/18/2018 1051   CO2 27 10/11/2014 1800   GLUCOSE 113 (H) 02/18/2018 1051   GLUCOSE 107 (H) 10/11/2014 1800   BUN 20 02/18/2018 1051   BUN 15 10/11/2014 1800   CREATININE 0.96 02/18/2018 1051   CREATININE 0.89 10/11/2014 1800   CALCIUM 9.0 02/18/2018 1051   CALCIUM 8.8 (L) 10/11/2014 1800   PROT 6.7 02/18/2018 1051   PROT 6.7 05/18/2017 1048   PROT 6.4 (L) 10/11/2014 1800   ALBUMIN 4.0 02/18/2018 1051   ALBUMIN 4.3 05/18/2017 1048   ALBUMIN 4.1 10/11/2014 1800   AST 21 02/18/2018 1051   AST 23 10/11/2014 1800   ALT 21 02/18/2018 1051   ALT 22 10/11/2014 1800   ALKPHOS 76 02/18/2018 1051   ALKPHOS 61 10/11/2014 1800   BILITOT 0.8 02/18/2018 1051   BILITOT 0.7 05/18/2017 1048   BILITOT 0.9 10/11/2014 1800   GFRNONAA >60 02/18/2018 1051   GFRNONAA >60 10/11/2014 1800   GFRAA >60 02/18/2018 1051   GFRAA >60 10/11/2014 1800    No results found for: SPEP, UPEP  Lab Results  Component Value Date   WBC 32.0 (H) 02/18/2018   NEUTROABS 2.9 02/18/2018   HGB 11.9 (L) 02/18/2018   HCT 36.2 (L) 02/18/2018   MCV 99.4 02/18/2018   PLT 141 (L) 02/18/2018      Chemistry      Component Value Date/Time   NA 140 02/18/2018 1051   NA 139 10/11/2014 1800   K 4.4 02/18/2018 1051  K 3.3 (L) 10/11/2014 1800   CL 110 02/18/2018 1051   CL 106 10/11/2014 1800   CO2 23 02/18/2018 1051   CO2 27 10/11/2014 1800   BUN 20 02/18/2018 1051   BUN 15  10/11/2014 1800   CREATININE 0.96 02/18/2018 1051   CREATININE 0.89 10/11/2014 1800      Component Value Date/Time   CALCIUM 9.0 02/18/2018 1051   CALCIUM 8.8 (L) 10/11/2014 1800   ALKPHOS 76 02/18/2018 1051   ALKPHOS 61 10/11/2014 1800   AST 21 02/18/2018 1051   AST 23 10/11/2014 1800   ALT 21 02/18/2018 1051   ALT 22 10/11/2014 1800   BILITOT 0.8 02/18/2018 1051   BILITOT 0.7 05/18/2017 1048   BILITOT 0.9 10/11/2014 1800       RADIOGRAPHIC STUDIES: I have personally reviewed the radiological images as listed and agreed with the findings in the report. No results found.   ASSESSMENT & PLAN:  CLL (chronic lymphocytic leukemia) ([place]) # CLL/SLL likely recurrent based on increasing white count-32,000.  Slightly increasing; however hemoglobin stable around 11-12 platelets 141.  #I suspect patient worsening fatigue is likely secondary to progressive CLL.  Recommend baseline CT of the chest and pelvis.    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  Patient is not a candidate for aggressive chemotherapy like FCR.  #Fatigue-progressive likely secondary to progressive disease.  Await above imaging.  #Follow-up in 2 weeks.  CT scans prior.   Orders Placed This Encounter  Procedures  . CT CHEST W CONTRAST    Standing Status:   Future    Number of Occurrences:   1    Standing Expiration Date:   02/19/2019    Order Specific Question:   If indicated for the ordered procedure, I authorize the administration of contrast media per Radiology protocol    Answer:   Yes    Order Specific Question:   Preferred imaging location?    Answer:   [facility]    Order Specific Question:   Radiology Contrast Protocol - do NOT remove file path    Answer:   \\[email or web address]    Order Specific Question:   ** REASON FOR EXAM (FREE TEXT)    Answer:   lymphoma  . CT Abdomen Pelvis W Contrast    Standing  Status:   Future    Number of Occurrences:   1    Standing Expiration Date:   02/18/2019    Order Specific Question:   ** REASON FOR EXAM (FREE TEXT)    Answer:   lymphoma    Order Specific Question:   If indicated for the ordered procedure, I authorize the administration of contrast media per Radiology protocol    Answer:   Yes    Order Specific Question:   Preferred imaging location?    Answer:   [facility]    Order Specific Question:   Is Oral Contrast requested for this exam?    Answer:   Yes, Per Radiology protocol    Order Specific Question:   Radiology Contrast Protocol - do NOT remove file path    Answer:   \\[email or web address]  . CT SOFT TISSUE NECK W CONTRAST    Standing Status:   Future    Number of Occurrences:   1    Standing Expiration Date:   05/22/2019    Order Specific Question:   ** REASON FOR EXAM (FREE TEXT)    Answer:   small cell  lymphoma    Order Specific Question:   If indicated for the ordered procedure, I authorize the administration of contrast media per Radiology protocol    Answer:   Yes    Order Specific Question:   Preferred imaging location?    Answer:   [facility]    Order Specific Question:   Radiology Contrast Protocol - do NOT remove file path    Answer:   \\[email or web address]  . TSH    Standing Status:   Future    Number of Occurrences:   1    Standing Expiration Date:   02/19/2019   All questions were answered. The patient knows to call the clinic with any problems, questions or concerns.      Cammie Sickle, MD 02/28/2018 9:09 PM

## 2018-02-18 NOTE — Assessment & Plan Note (Addendum)
#   CLL/SLL likely recurrent based on increasing white count-32,000.  Slightly increasing; however hemoglobin stable around 11-12 platelets 141.  #I suspect patient worsening fatigue is likely secondary to progressive CLL.  Recommend baseline CT of the chest and pelvis.    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  Patient is not a candidate for aggressive chemotherapy like FCR.  #Fatigue-progressive likely secondary to progressive disease.  Await above imaging.  #Follow-up in 2 weeks.  CT scans prior.

## 2018-02-20 LAB — CUP PACEART REMOTE DEVICE CHECK
Brady Statistic AP VP Percent: 0.14 %
Brady Statistic AP VS Percent: 99.33 %
Brady Statistic AS VP Percent: 0 %
Brady Statistic RV Percent Paced: 0.14 %
Date Time Interrogation Session: 20190730175042
Implantable Lead Location: 753860
Implantable Lead Model: 5076
Implantable Lead Model: 5076
Lead Channel Impedance Value: 437 Ohm
Lead Channel Sensing Intrinsic Amplitude: 21.625 mV
Lead Channel Sensing Intrinsic Amplitude: 21.625 mV
Lead Channel Sensing Intrinsic Amplitude: 3 mV
Lead Channel Setting Pacing Amplitude: 2 V
Lead Channel Setting Pacing Amplitude: 2.5 V
Lead Channel Setting Pacing Pulse Width: 0.4 ms
MDC IDC LEAD IMPLANT DT: 20170814
MDC IDC LEAD IMPLANT DT: 20170814
MDC IDC LEAD LOCATION: 753859
MDC IDC MSMT BATTERY REMAINING LONGEVITY: 94 mo
MDC IDC MSMT BATTERY VOLTAGE: 3.01 V
MDC IDC MSMT LEADCHNL RA IMPEDANCE VALUE: 323 Ohm
MDC IDC MSMT LEADCHNL RA IMPEDANCE VALUE: 456 Ohm
MDC IDC MSMT LEADCHNL RA PACING THRESHOLD AMPLITUDE: 1 V
MDC IDC MSMT LEADCHNL RA PACING THRESHOLD PULSEWIDTH: 0.4 ms
MDC IDC MSMT LEADCHNL RA SENSING INTR AMPL: 3 mV
MDC IDC MSMT LEADCHNL RV IMPEDANCE VALUE: 551 Ohm
MDC IDC MSMT LEADCHNL RV PACING THRESHOLD AMPLITUDE: 0.875 V
MDC IDC MSMT LEADCHNL RV PACING THRESHOLD PULSEWIDTH: 0.4 ms
MDC IDC PG IMPLANT DT: 20170814
MDC IDC SET LEADCHNL RV SENSING SENSITIVITY: 0.9 mV
MDC IDC STAT BRADY AS VS PERCENT: 0.53 %
MDC IDC STAT BRADY RA PERCENT PACED: 99.47 %

## 2018-02-22 ENCOUNTER — Telehealth: Payer: Self-pay | Admitting: Internal Medicine

## 2018-02-22 NOTE — Telephone Encounter (Signed)
I called and spoke with the patient's wife (ok per DPR). She is aware that the TSH that was added on with his most recent lab work at the [facility] was normal.

## 2018-02-22 NOTE — Telephone Encounter (Signed)
New message ° ° °Please call with lab results °

## 2018-02-25 ENCOUNTER — Ambulatory Visit
Admission: RE | Admit: 2018-02-25 | Discharge: 2018-02-25 | Disposition: A | Discharge status: Home / Self Care | Payer: Medicare HMO | Source: Ambulatory Visit | Attending: Internal Medicine | Admitting: Internal Medicine

## 2018-02-25 DIAGNOSIS — C9110 Chronic lymphocytic leukemia of B-cell type not having achieved remission: Secondary | ICD-10-CM | POA: Diagnosis not present

## 2018-02-25 DIAGNOSIS — C9190 Lymphoid leukemia, unspecified not having achieved remission: Secondary | ICD-10-CM | POA: Insufficient documentation

## 2018-02-25 DIAGNOSIS — R591 Generalized enlarged lymph nodes: Secondary | ICD-10-CM | POA: Diagnosis not present

## 2018-02-25 IMAGING — CT CT ABD-PELV W/ CM
3 of 5 series · 10 of 33 positions shown, 11 images · IV contrast (omnipaque)
Comparison: [DATE]

CLINICAL DATA: Follow-up CLL

EXAM:
CT CHEST, ABDOMEN, AND PELVIS WITH CONTRAST
TECHNIQUE: Multidetector CT imaging of the chest, abdomen and pelvis was
performed following the standard protocol during bolus
administration of intravenous contrast.
CONTRAST:  100mL OMNIPAQUE IOHEXOL 300 MG/ML  SOLN

[Series 5: cap with (person_name) · axial · 0.76mm/px · z∈[-1059,-794]mm · 2 of 134 slices shown, 3 images (1 of 3)]
[im 54/134  soft-tissue]
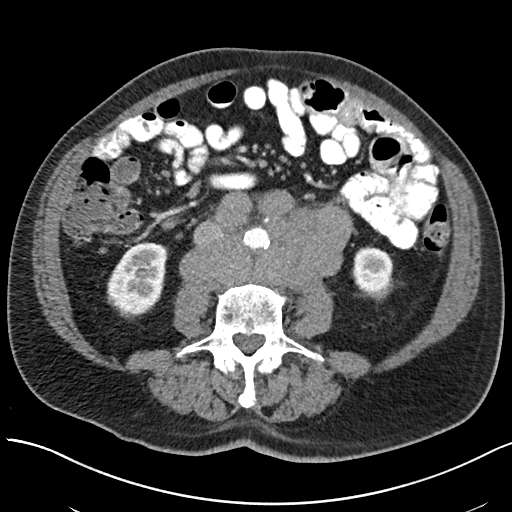
[im 54/134  bone]
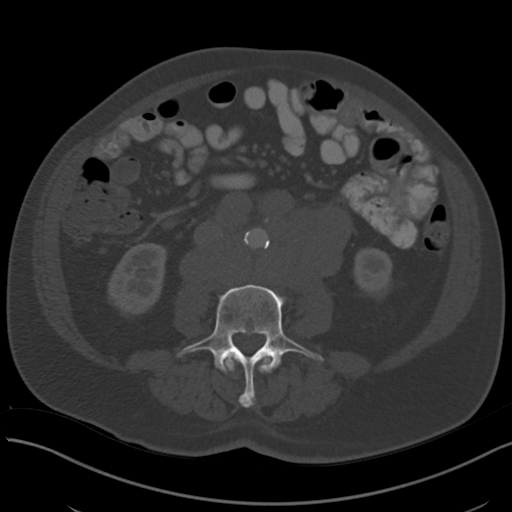
[im 107/134  bone]
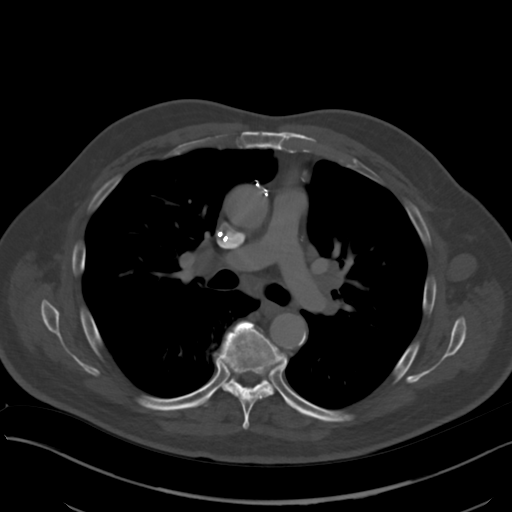

[Series 9: cap with (person_name) · coronal · 0.76mm/px · 3 of 163 slices shown (2 of 3)]
[im 33/163  bone]
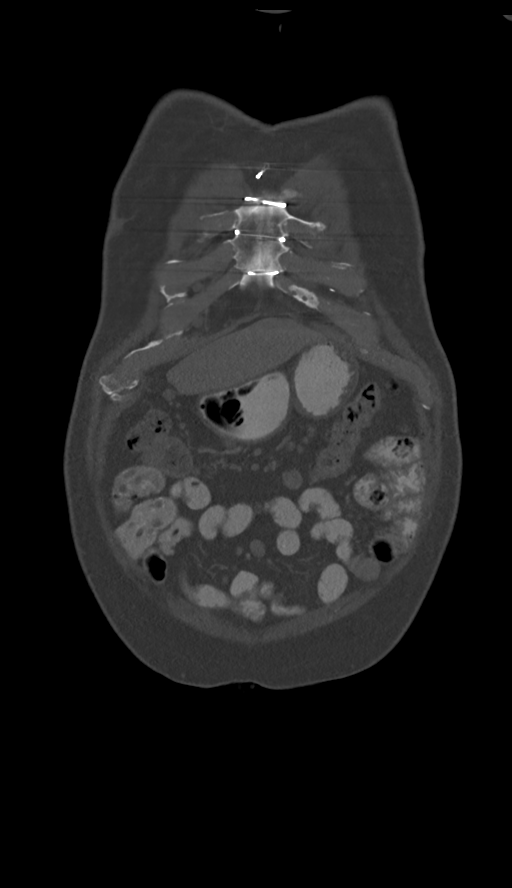
[im 65/163  bone]
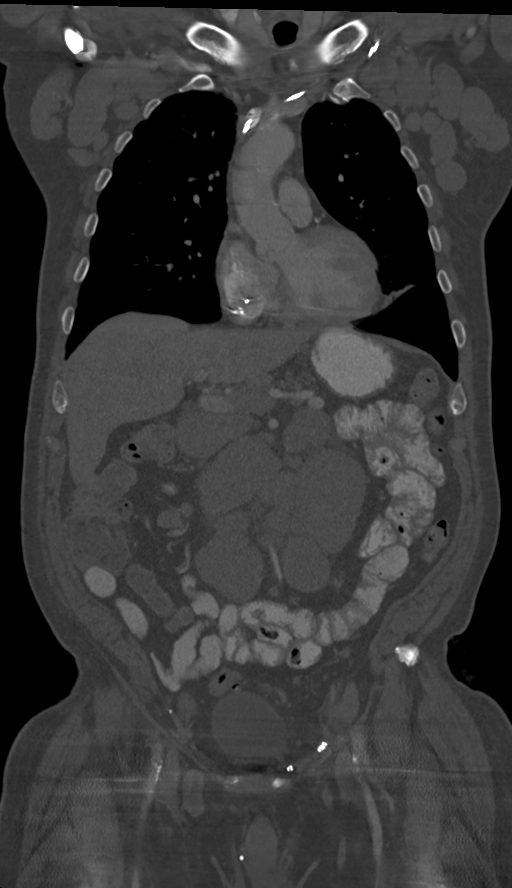
[im 98/163  bone]
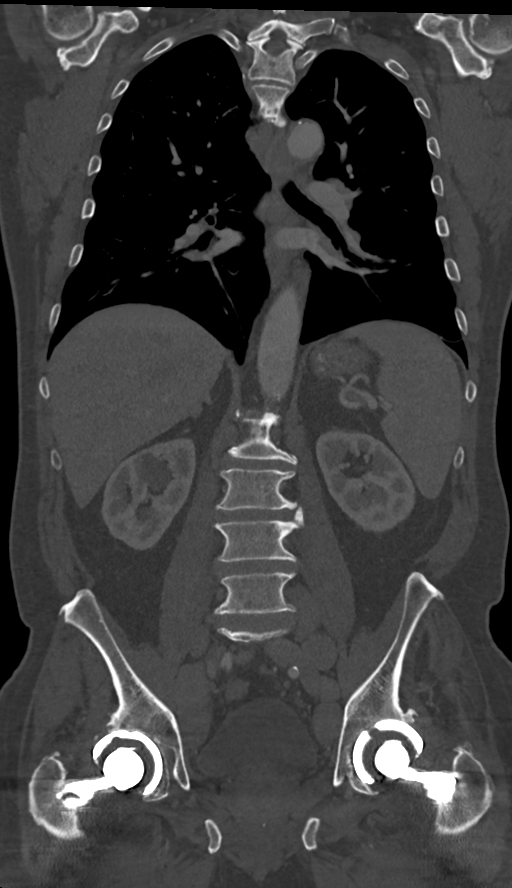

[Series 11: cap with (person_name) · sagittal · 0.64mm/px · 5 of 195 slices shown (3 of 3)]
[im 65/195  bone]
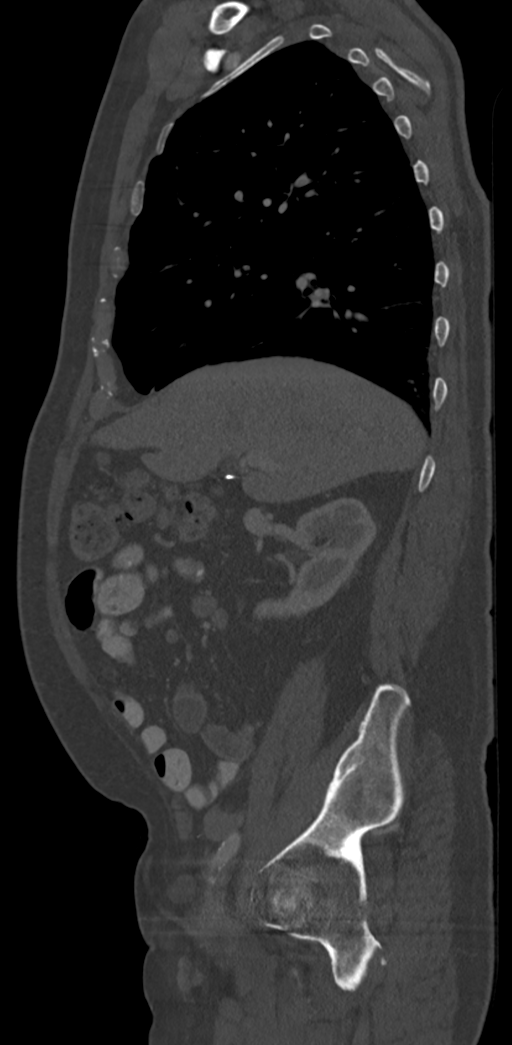
[im 81/195  bone]
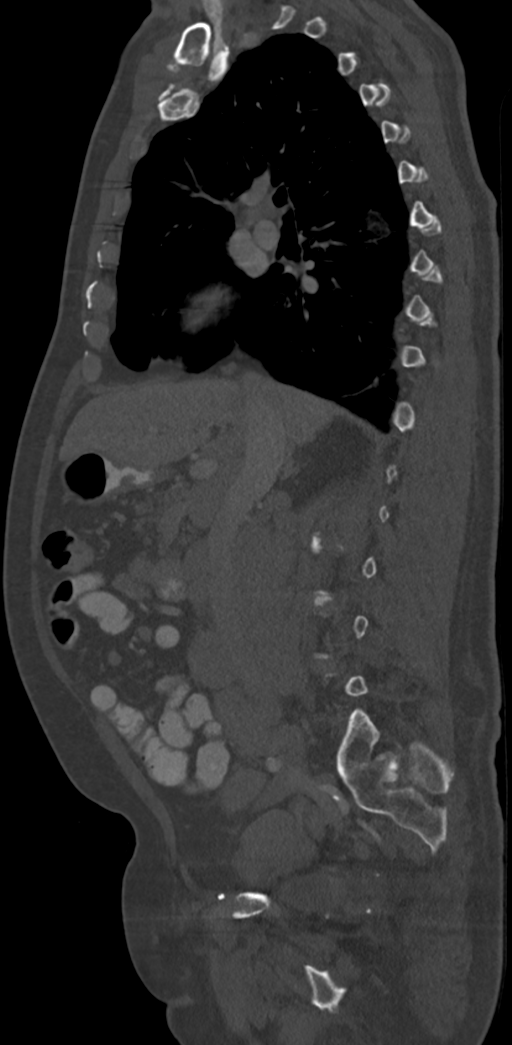
[im 98/195  bone]
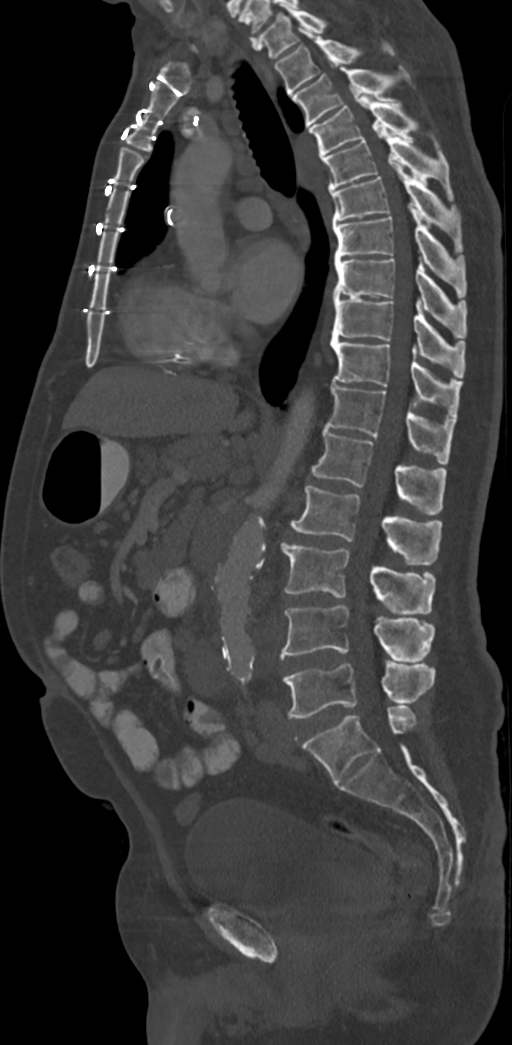
[im 114/195  bone]
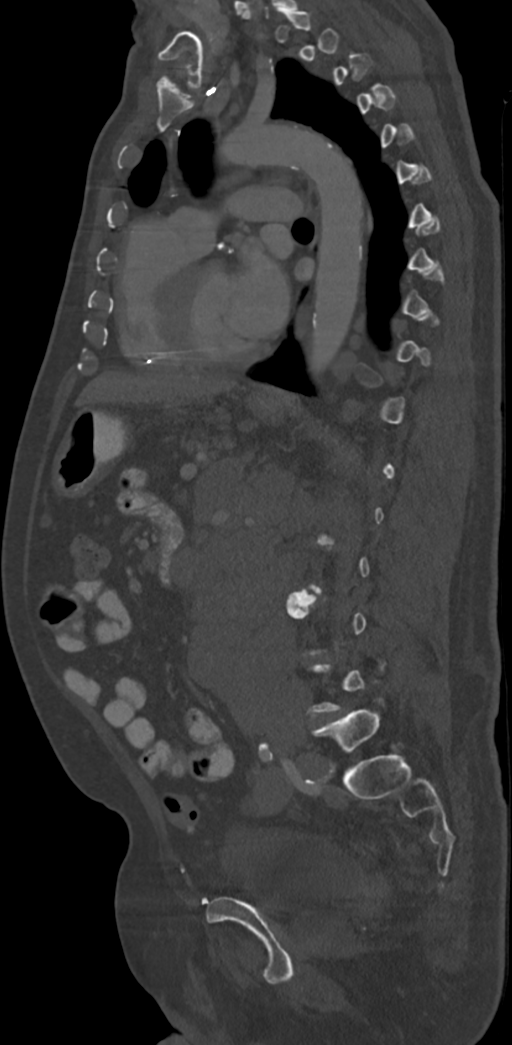
[im 130/195  bone]
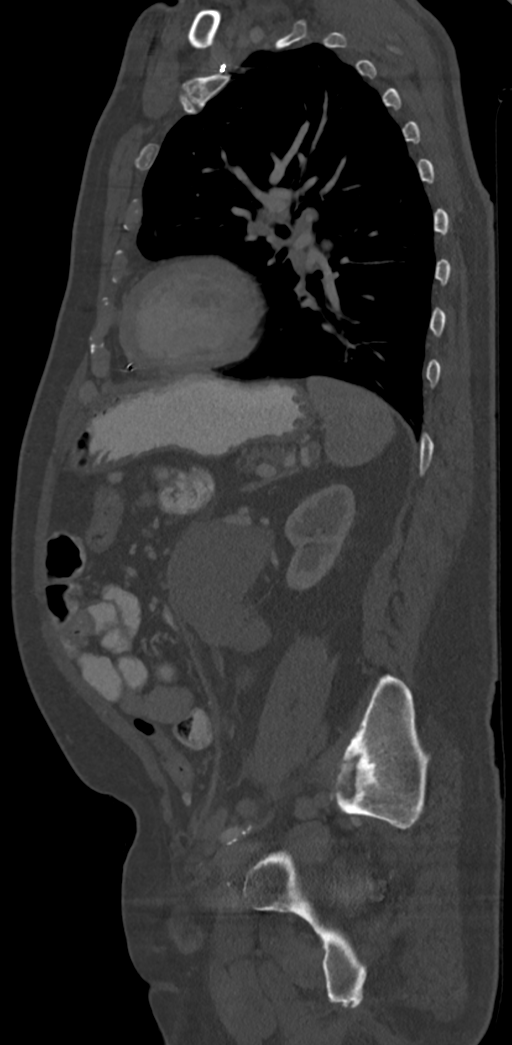

[10 of 33 positions shown; findings below may reference images not displayed]

FINDINGS: CT CHEST FINDINGS

Cardiovascular: The heart size is normal. There is been previous
median sternotomy and CABG procedure. Aortic atherosclerosis noted.

Mediastinum/Nodes: The trachea appears patent and is midline. Normal
appearance of the thyroid gland. Normal appearance of the
esophagus..

Extensive thoracic adenopathy is identified compatible with
recurrence of disease. Index left supraclavicular lymph node
measures 2.3 cm, image [DATE]. Index left axillary lymph node measures
2.2 cm, image [DATE]. Index right axillary node measures 2.8 cm, image
[DATE]. Right supraclavicular lymph node measures 1.9 cm, image [DATE].
Index right paratracheal lymph node measures 1.6 cm, image [DATE].
Index left AP window lymph node measures 1.3 cm, image [DATE].

Lungs/Pleura: No pleural effusion identified. No airspace
consolidation, atelectasis or pneumothorax identified. Chronic
scarring and mild bronchiectasis within the lingula noted.

Musculoskeletal: Spondylosis within the thoracic spine noted. No
suspicious bone lesions identified.

CT ABDOMEN PELVIS FINDINGS

Hepatobiliary: No focal liver abnormality is seen. Status post
cholecystectomy. No biliary dilatation.

Pancreas: Unremarkable. No pancreatic ductal dilatation or
surrounding inflammatory changes.

Spleen: The spleen measures 13 by 11.7 by 5.8 cm (volume = 460
cm^3). On the previous exam the spleen measured 10.3 by 9.5 x
cm (volume = 230 cm^3).

Adrenals/Urinary Tract: Normal appearance of the adrenal glands. No
kidney mass or hydronephrosis. Urinary bladder is unremarkable.

Stomach/Bowel: Stomach is within normal limits. Appendix appears
normal. No evidence of bowel wall thickening, distention, or
inflammatory changes.

Vascular/Lymphatic: Aortic atherosclerosis without aneurysm. The
infrarenal abdominal aorta measures 2.4 cm in maximum AP dimension.
Extensive bulky adenopathy within the abdomen and pelvis identified.
Large nodal mass involving the retroperitoneum and encasing and up
lifting the aorta and IVC measures 13.1 by 7.2 by 16.5 cm. There is
extensive bilateral iliac adenopathy. Large right pelvic sidewall
node measures 3.5 by 6.5 cm. Enlarged bilateral inguinal lymph nodes
identified. Index right inguinal lymph node measures 3.6 by 1.6 cm,
image 114/5.

Reproductive: Prostate is unremarkable.

Other: None

Musculoskeletal: Previous bilateral hip arthroplasty. Multi level
spondylosis identified
IMPRESSION: 1. Examination is positive for recurrent bulky adenopathy within the
chest, abdomen and pelvis compatible with recurrent lymphoma
2. Interval development of splenomegaly.
3.  Aortic Atherosclerosis ([AT]-[AT]).

## 2018-02-25 IMAGING — CT CT NECK W/ CM
4 of 6 series · 13 of 33 positions shown, 15 images · IV contrast (omnipaque)
Comparison: [DATE].  [DATE]..

CLINICAL DATA: CLL/SLL.  Increasing white count.

EXAM:
CT NECK WITH CONTRAST
TECHNIQUE: Multidetector CT imaging of the neck was performed using the
standard protocol following the bolus administration of intravenous
contrast.
CONTRAST:  100mL OMNIPAQUE IOHEXOL 300 MG/ML  SOLN

[Series 3: bone windows neck · axial · 0.64mm/px · z∈[-690,-596]mm · 2 of 143 slices shown]
[im 48/143  bone]
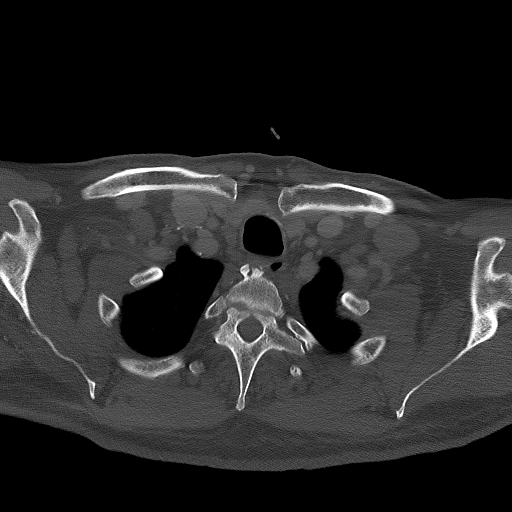
[im 95/143  bone]
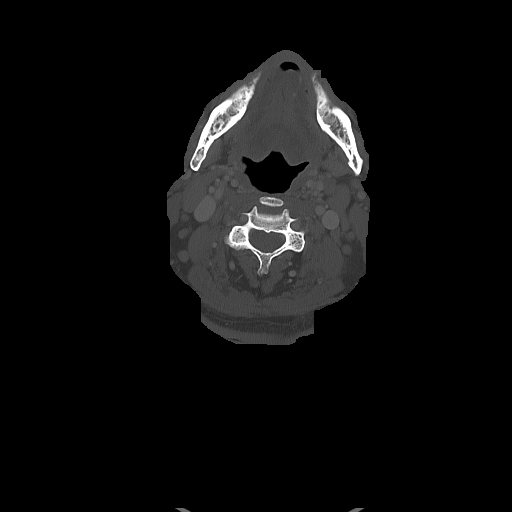

[Series 5: coronals neck · coronal · 0.40mm/px · 3 of 165 slices shown]
[im 33/165  bone]
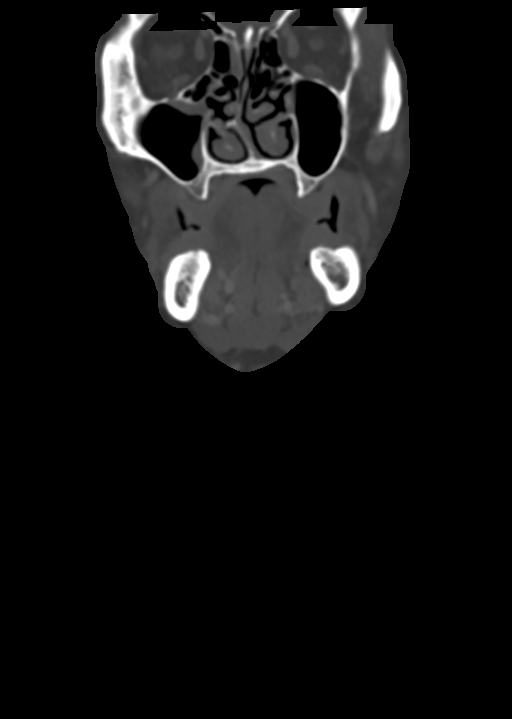
[im 66/165  bone]
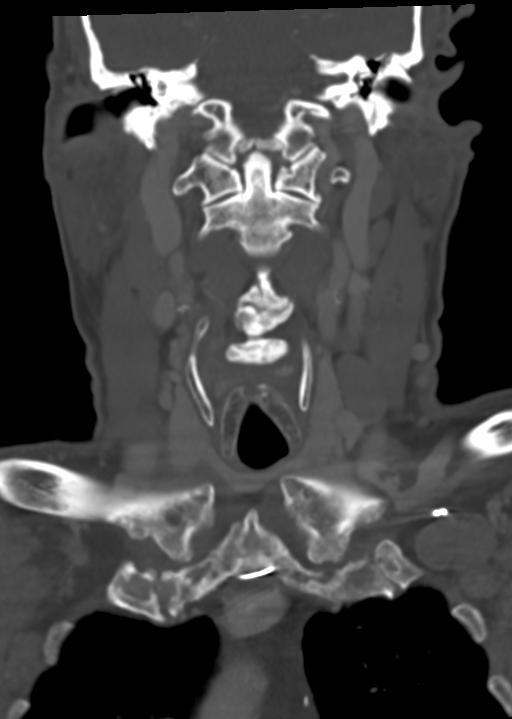
[im 99/165  bone]
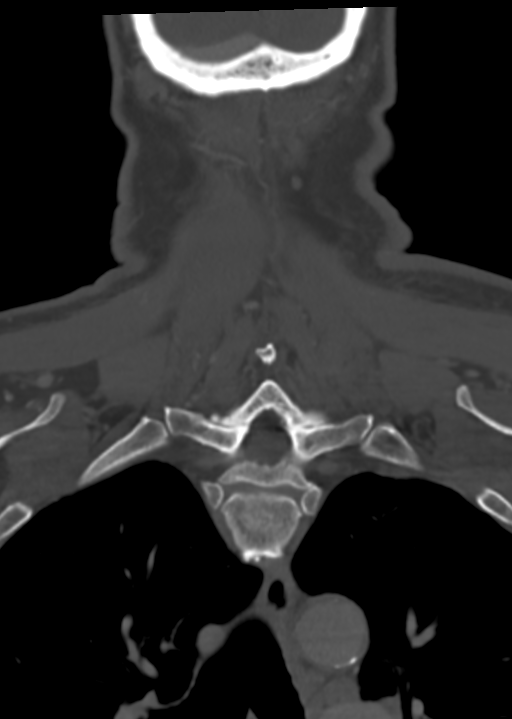

[Series 6: sagittals neck · sagittal · 0.57mm/px · 5 of 103 slices shown, 6 images]
[im 35/103  bone]
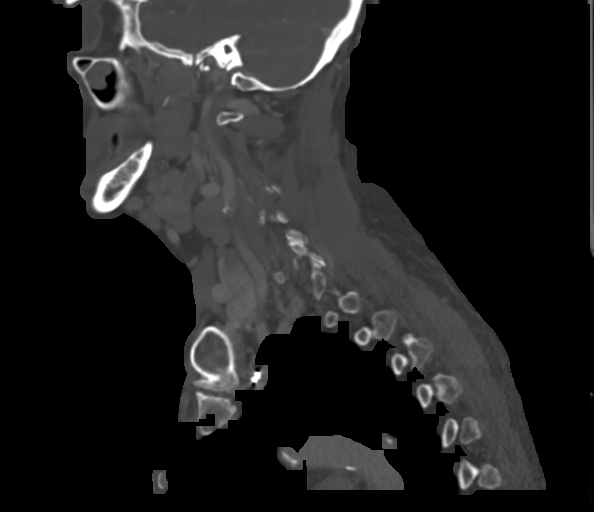
[im 43/103  bone]
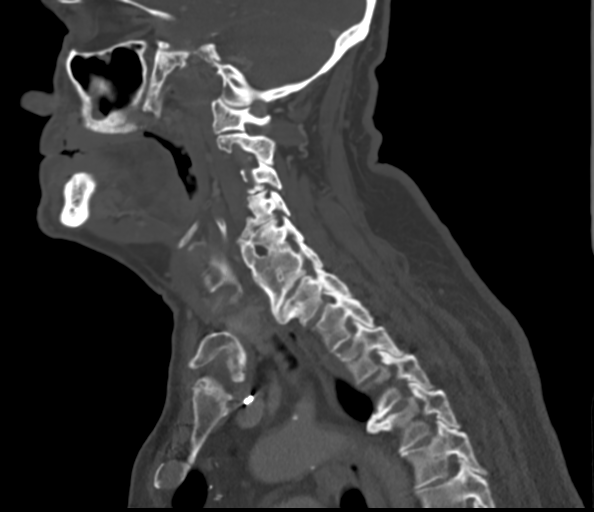
[im 52/103  soft-tissue]
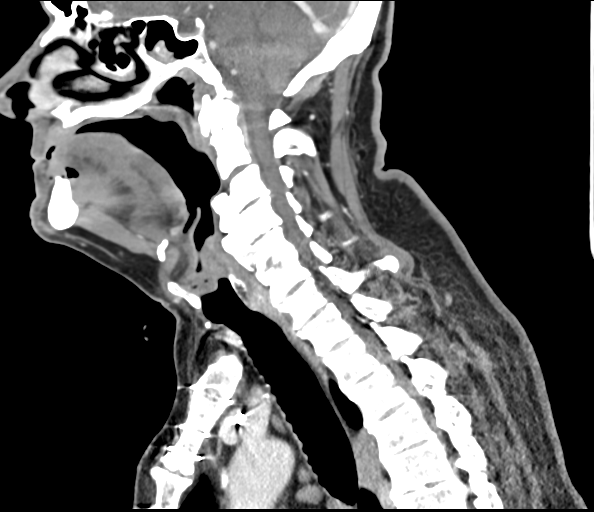
[im 52/103  bone]
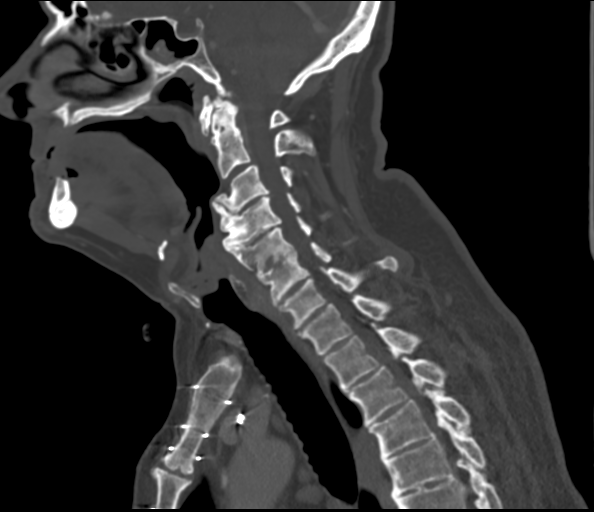
[im 60/103  bone]
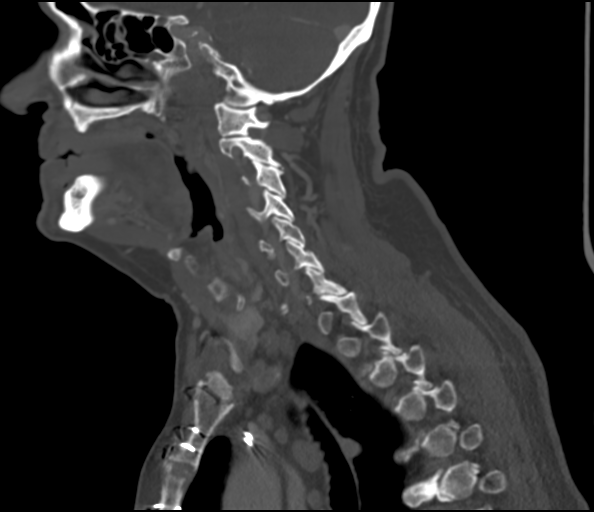
[im 69/103  bone]
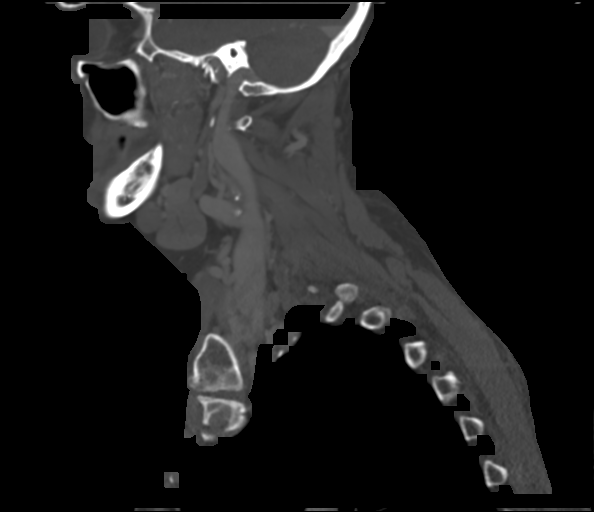

[Series 7: ax oropharynx neck (person_name) · axial · 0.51mm/px · z∈[-771,-599]mm · 3 of 182 slices shown, 4 images]
[im 46/182  soft-tissue]
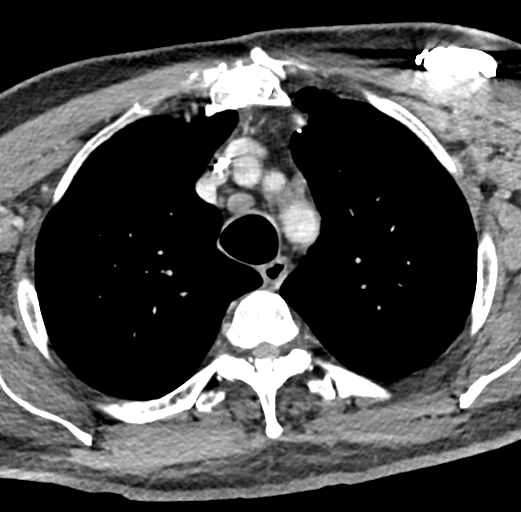
[im 46/182  bone]
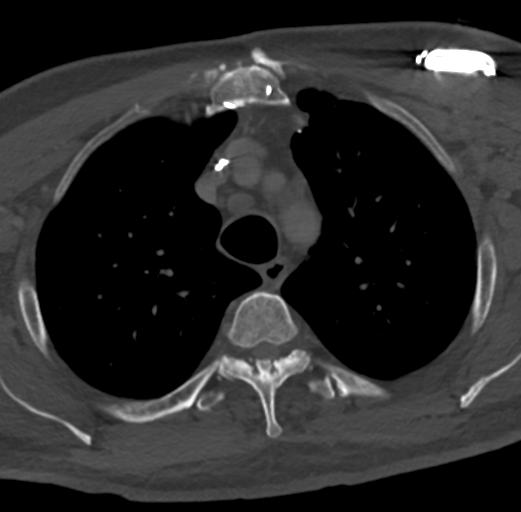
[im 91/182  bone]
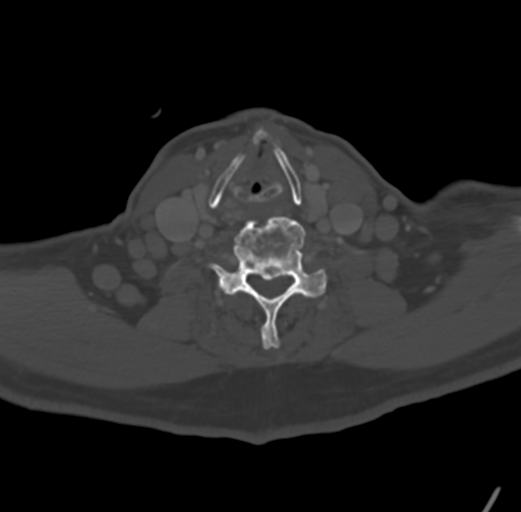
[im 136/182  bone]
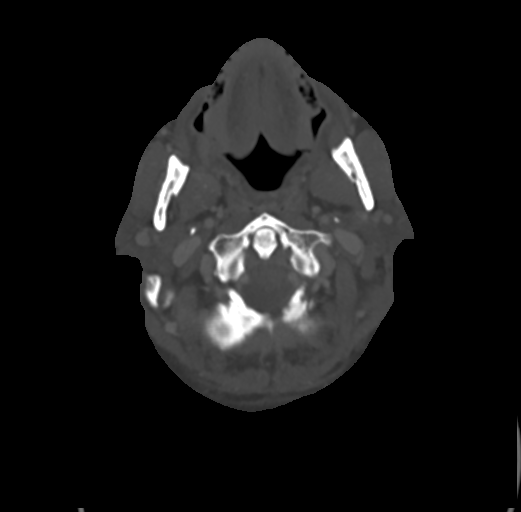

[13 of 33 positions shown; findings below may reference images not displayed]

FINDINGS: Pharynx and larynx: No mucosal or submucosal mass.

Salivary glands: Parotid and submandibular glands are normal.

Thyroid: No significant thyroid finding.

Lymph nodes: Lymphadenopathy throughout the neck at all nodal
stations. Index level 2 node on the right image 50 measures 14 x 23
mm. Index level 2 node on the left image 53 measures 18 x 23 mm.
Index level 3 node on the left image 80 measures 8 x 16 mm. Index
level 5 node on the right image 69 measures 18 x 23 mm. Index level
5 node on the left image 69 measures 20 x 21 mm.

Vascular: Carotid bifurcation atherosclerotic calcification.

Limited intracranial: Normal

Visualized orbits: Normal

Mastoids and visualized paranasal sinuses: Chronic inflammatory
changes of the sphenoid sinus.

Skeleton: chronic cervical spondylosis with prominent anterior and
posterior osteophytes.

Upper chest: See results of chest CT.

Other: None
IMPRESSION: Bilateral lymphadenopathy throughout the neck affecting all nodal
stations. Index nodes measured as above.

## 2018-02-25 MED ORDER — IOHEXOL 300 MG/ML  SOLN
100.0000 mL | Freq: Once | INTRAMUSCULAR | Status: AC | PRN
  Administered 2018-02-25: 100 mL via INTRAVENOUS

## 2018-03-03 LAB — IGVH SOMATIC HYPERMUTATION

## 2018-03-05 ENCOUNTER — Other Ambulatory Visit: Payer: Self-pay

## 2018-03-05 ENCOUNTER — Telehealth: Payer: Self-pay | Admitting: Pharmacist

## 2018-03-05 ENCOUNTER — Inpatient Hospital Stay: Payer: Medicare HMO

## 2018-03-05 ENCOUNTER — Inpatient Hospital Stay (HOSPITAL_BASED_OUTPATIENT_CLINIC_OR_DEPARTMENT_OTHER): Payer: Medicare HMO | Admitting: Internal Medicine

## 2018-03-05 VITALS — BP 147/91 | HR 67 | Temp 97.9°F | Resp 18 | Ht 69.0 in | Wt 209.0 lb

## 2018-03-05 DIAGNOSIS — I4891 Unspecified atrial fibrillation: Secondary | ICD-10-CM | POA: Diagnosis not present

## 2018-03-05 DIAGNOSIS — M2550 Pain in unspecified joint: Secondary | ICD-10-CM

## 2018-03-05 DIAGNOSIS — Z79899 Other long term (current) drug therapy: Secondary | ICD-10-CM | POA: Diagnosis not present

## 2018-03-05 DIAGNOSIS — C9110 Chronic lymphocytic leukemia of B-cell type not having achieved remission: Secondary | ICD-10-CM

## 2018-03-05 DIAGNOSIS — G8929 Other chronic pain: Secondary | ICD-10-CM | POA: Diagnosis not present

## 2018-03-05 DIAGNOSIS — Z7901 Long term (current) use of anticoagulants: Secondary | ICD-10-CM | POA: Diagnosis not present

## 2018-03-05 DIAGNOSIS — Z87891 Personal history of nicotine dependence: Secondary | ICD-10-CM | POA: Diagnosis not present

## 2018-03-05 DIAGNOSIS — M545 Low back pain: Secondary | ICD-10-CM | POA: Diagnosis not present

## 2018-03-05 DIAGNOSIS — R5383 Other fatigue: Secondary | ICD-10-CM

## 2018-03-05 MED ORDER — IBRUTINIB 420 MG PO TABS: 420.0000 mg | ORAL_TABLET | Freq: Every day | ORAL | 3 refills | Status: DC

## 2018-03-05 NOTE — Progress Notes (Signed)
[facility] OFFICE PROGRESS NOTE  Patient Care Team: Leone Haven, MD as PCP - General (Family Medicine) [name], MD as Consulting Physician (Cardiology)  Cancer Staging No matching staging information was found for the patient.   Oncology History   # AUG 2015- SLL/CLL [Right Ax Ln Bx] s/p Benda-Rituxan x6 [finished March 2016]; Maintenance Rituxan q 53m[started April 2016; Dr.Pandit];Last Ritux Jan 2017.  MARCH 2017- CT N/C/A/P- NED. 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  male pleasant patient above history of CLL-high risk is here to review the results of his CT scan chest abdomen pelvis-given his progressive symptoms of worsening fatigue poor appetite.  Patient continues to complain of worsening fatigue.  Positive weight loss.  No fevers or chills or night sweats.  Chronic back pain chronic joint pain bruises easily on Eliquis.  Review of Systems  Constitutional: Positive for malaise/fatigue and weight loss. Negative for chills, diaphoresis and fever.  HENT: Negative for nosebleeds and sore throat.   Eyes: Negative for double vision.  Respiratory: Negative for cough, hemoptysis, sputum production, shortness of breath and wheezing.   Cardiovascular: Negative for chest pain, palpitations, orthopnea and leg swelling.  Gastrointestinal: Negative for abdominal pain, blood in stool, constipation, diarrhea, heartburn, melena, nausea and vomiting.  Genitourinary: Negative for dysuria, frequency and urgency.  Musculoskeletal: Positive for back pain and joint pain.  Skin: Negative.  Negative for itching and rash.  Neurological: Negative for dizziness, tingling, focal weakness, weakness and  headaches.  Endo/Heme/Allergies: Bruises/bleeds easily.  Psychiatric/Behavioral: Negative for depression. The patient is not nervous/anxious and does not have insomnia.       PAST MEDICAL HISTORY :  Past Medical History:  Diagnosis Date  . Anxiety   . Arthritis   . Atrial fibrillation ([place]    a. Dx 2013, recurred 02/2014, CHA2DS2VASc = 3 -->placed on Eliquis;  b. 02/2014 Echo: EF 50-55%, mid and apical anterior septum and mid and apical inf septum are abnl, mild to mod Ao sclerosis w/o AS.  .Marland KitchenChicken pox   . Chronic lymphocytic leukemia ([place]    a. Dx 02/2014.  .Marland KitchenComplication of anesthesia    History of  PTSD--do not touch patient when waking up from surgery.  .Marland KitchenCOPD (chronic obstructive pulmonary disease) ([place]   . Coronary artery disease    a. 04/2009 CABG x 3 (LIMA->LAD, VG->OM1, VG->PDA);  b. 09/2009 Cath: occluded VG x 2 w/ patent LIMA and L->R collats. EF 55%, mild antlat HK;  c. 10/2011 MV: EF 53%, no isch/infarct-->low risk.  .Marland KitchenDysrhythmia    hx of a-fib  . GERD (gastroesophageal reflux disease)    occasional  . History of chemotherapy 2015-2016  . HOH (hard of hearing)    Bilateral Hearing Aids  . Hypertension   . Myocardial infarction ([place] 2010  . OSA on CPAP    USE C-PAP  . Presence of permanent cardiac pacemaker 2017  . PTSD (post-traumatic stress disorder)   . PTSD (post-traumatic stress disorder)   . Pure hypercholesterolemia   . Rheumatic fever 1959    PAST SURGICAL HISTORY :   Past Surgical History:  Procedure Laterality Date  . ABDOMINAL HERNIA REPAIR    .  APPENDECTOMY  06/21/1985  . CARDIAC CATHETERIZATION  2010; 2011   ; Dr Fletcher Anon  . CORONARY ARTERY BYPASS GRAFT  04/2009   "CABG X3"  . EP IMPLANTABLE DEVICE N/A 03/03/2016   Procedure: Pacemaker Implant;  Surgeon: Deboraha Sprang, MD;  Location: [facility];  Service: Cardiovascular;  Laterality: N/A;  . FOREIGN BODY REMOVAL  1968   "shrapnel in my tailbone"  . INGUINAL HERNIA REPAIR Right   .  INSERT / REPLACE / REMOVE PACEMAKER    . JOINT REPLACEMENT Right 2018  . LAPAROSCOPIC CHOLECYSTECTOMY    . TONSILLECTOMY AND ADENOIDECTOMY  1956  . TOTAL HIP ARTHROPLASTY Right 10/22/2016   Procedure: TOTAL HIP ARTHROPLASTY;  Surgeon: Dereck Leep, MD;  Location: [facility];  Service: Orthopedics;  Laterality: Right;  . TOTAL HIP ARTHROPLASTY Left 11/04/2017   Procedure: TOTAL HIP ARTHROPLASTY;  Surgeon: Dereck Leep, MD;  Location: [facility];  Service: Orthopedics;  Laterality: Left;    FAMILY HISTORY :   Family History  Problem Relation Age of Onset  . Heart disease Mother   . Coronary artery disease Unknown        family history    SOCIAL HISTORY:   Social History   Tobacco Use  . Smoking status: Former Smoker    Packs/day: 1.00    Years: 40.00    Pack years: 40.00    Types: Cigarettes    Last attempt to quit: 07/21/2006    Years since quitting: 11.6  . Smokeless tobacco: Never Used  Substance Use Topics  . Alcohol use: Yes    Alcohol/week: 1.0 standard drinks    Types: 1 Standard drinks or equivalent per week    Comment: rarely  . Drug use: No    ALLERGIES:  has No Known Allergies.  MEDICATIONS:  Current Outpatient Medications  Medication Sig Dispense Refill  . acetaminophen (TYLENOL) 500 MG tablet Take 1,000 mg by mouth every 8 (eight) hours as needed for mild pain.    Marland Kitchen albuterol (PROVENTIL HFA;VENTOLIN HFA) 108 (90 Base) MCG/ACT inhaler Inhale 2 puffs into the lungs every 6 (six) hours as needed for wheezing or shortness of breath. 1 Inhaler 11  . amiodarone (PACERONE) 200 MG tablet Take 0.5 tablets (100 mg total) by mouth 2 (two) times daily. 30 tablet 3  . apixaban (ELIQUIS) 5 MG TABS tablet Take 1 tablet (5 mg total) by mouth 2 (two) times daily. 180 tablet 3  . atorvastatin (LIPITOR) 80 MG tablet Take 1 tablet (80 mg total) by mouth at bedtime. 90 tablet 3  . budesonide-formoterol (SYMBICORT) 160-4.5 MCG/ACT inhaler Inhale 2 puffs into the lungs 2 (two) times  daily. 1 Inhaler 11  . cetirizine (ZYRTEC) 10 MG tablet Take 10 mg by mouth daily as needed for allergies.     . Coenzyme Q10 (COQ10) 200 MG CAPS Take 200 mg by mouth daily.    Marland Kitchen ezetimibe (ZETIA) 10 MG tablet Take 1 tablet (10 mg total) by mouth daily. 90 tablet 3  . Krill Oil 350 MG CAPS Take 350 mg by mouth every evening.    . metoprolol tartrate (LOPRESSOR) 25 MG tablet Take 25 mg by mouth 2 (two) times daily.    . mirtazapine (REMERON) 15 MG tablet Take 15 mg by mouth at bedtime as needed (for panic associated with PTSD).     . Multiple Vitamin (MULTIVITAMIN WITH MINERALS) TABS tablet Take 1 tablet by mouth daily.    . mupirocin ointment (BACTROBAN) 2 % Apply  1 application topically 3 (three) times daily. 30 g 1  . tiotropium (SPIRIVA) 18 MCG inhalation capsule Place 1 capsule (18 mcg total) into inhaler and inhale at bedtime. 30 capsule 11  . traMADol (ULTRAM) 50 MG tablet TAKE 1 TABLET BY MOUTH EVERY 12 HOURS AS NEEDED FOR MODERATE PAIN 90 tablet 0  . Ibrutinib 420 MG TABS Take 420 mg by mouth daily. 28 tablet 3  . nitroGLYCERIN (NITROSTAT) 0.4 MG SL tablet Place 1 tablet (0.4 mg total) under the tongue every 5 (five) minutes as needed for chest pain. (Patient not taking: Reported on 03/05/2018) 25 tablet 6   No current facility-administered medications for this visit.     PHYSICAL EXAMINATION: ECOG PERFORMANCE STATUS: 1 - Symptomatic but completely ambulatory  BP (!) 147/91   Pulse 67   Temp 97.9 F (36.6 C) (Tympanic)   Resp 18   Ht '5\' 9"'$  (1.753 m)   Wt 209 lb (94.8 kg)   BMI 30.86 kg/m   Filed Weights   03/05/18 1048  Weight: 209 lb (94.8 kg)    GENERAL: Well-nourished well-developed; Alert, no distress and comfortable.  Accompanied by family.  EYES: no pallor or icterus OROPHARYNX: no thrush or ulceration; NECK: supple; no lymph nodes felt. LYMPH: Positive for adenopathy in the underarm bilaterally. LUNGS: Decreased breath sounds auscultation bilaterally. No wheeze  or crackles HEART/CVS: regular rate & rhythm and no murmurs; No lower extremity edema ABDOMEN:abdomen soft, non-tender and normal bowel sounds. No hepatomegaly or splenomegaly.  Musculoskeletal:no cyanosis of digits and no clubbing  PSYCH: alert & oriented x 3 with fluent speech NEURO: no focal motor/sensory deficits SKIN:  no rashes or significant lesions    LABORATORY DATA:  I have reviewed the data as listed    Component Value Date/Time   NA 140 02/18/2018 1051   NA 139 10/11/2014 1800   K 4.4 02/18/2018 1051   K 3.3 (L) 10/11/2014 1800   CL 110 02/18/2018 1051   CL 106 10/11/2014 1800   CO2 23 02/18/2018 1051   CO2 27 10/11/2014 1800   GLUCOSE 113 (H) 02/18/2018 1051   GLUCOSE 107 (H) 10/11/2014 1800   BUN 20 02/18/2018 1051   BUN 15 10/11/2014 1800   CREATININE 0.96 02/18/2018 1051   CREATININE 0.89 10/11/2014 1800   CALCIUM 9.0 02/18/2018 1051   CALCIUM 8.8 (L) 10/11/2014 1800   PROT 6.7 02/18/2018 1051   PROT 6.7 05/18/2017 1048   PROT 6.4 (L) 10/11/2014 1800   ALBUMIN 4.0 02/18/2018 1051   ALBUMIN 4.3 05/18/2017 1048   ALBUMIN 4.1 10/11/2014 1800   AST 21 02/18/2018 1051   AST 23 10/11/2014 1800   ALT 21 02/18/2018 1051   ALT 22 10/11/2014 1800   ALKPHOS 76 02/18/2018 1051   ALKPHOS 61 10/11/2014 1800   BILITOT 0.8 02/18/2018 1051   BILITOT 0.7 05/18/2017 1048   BILITOT 0.9 10/11/2014 1800   GFRNONAA >60 02/18/2018 1051   GFRNONAA >60 10/11/2014 1800   GFRAA >60 02/18/2018 1051   GFRAA >60 10/11/2014 1800    No results found for: SPEP, UPEP  Lab Results  Component Value Date   WBC 32.0 (H) 02/18/2018   NEUTROABS 2.9 02/18/2018   HGB 11.9 (L) 02/18/2018   HCT 36.2 (L) 02/18/2018   MCV 99.4 02/18/2018   PLT 141 (L) 02/18/2018      Chemistry      Component Value Date/Time   NA 140 02/18/2018 1051   NA 139 10/11/2014 1800   K  4.4 02/18/2018 1051   K 3.3 (L) 10/11/2014 1800   CL 110 02/18/2018 1051   CL 106 10/11/2014 1800   CO2 23 02/18/2018  1051   CO2 27 10/11/2014 1800   BUN 20 02/18/2018 1051   BUN 15 10/11/2014 1800   CREATININE 0.96 02/18/2018 1051   CREATININE 0.89 10/11/2014 1800      Component Value Date/Time   CALCIUM 9.0 02/18/2018 1051   CALCIUM 8.8 (L) 10/11/2014 1800   ALKPHOS 76 02/18/2018 1051   ALKPHOS 61 10/11/2014 1800   AST 21 02/18/2018 1051   AST 23 10/11/2014 1800   ALT 21 02/18/2018 1051   ALT 22 10/11/2014 1800   BILITOT 0.8 02/18/2018 1051   BILITOT 0.7 05/18/2017 1048   BILITOT 0.9 10/11/2014 1800       RADIOGRAPHIC STUDIES: I have personally reviewed the radiological images as listed and agreed with the findings in the report. No results found.   ASSESSMENT & PLAN:  CLL (chronic lymphocytic leukemia) ([place]) # CLL/SLL likely recurrent based on increasing white count-32,000.  however hemoglobin stable around 11-12 platelets 141.  August 2019 CT scan shows-bilateral axilla adenopathy mediastinal adenopathy bulky abdominal/retroperitoneal adenopathy; splenomegaly.  Worsening.  #Proceed with PET scan to rule out transformation ASAP.  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  Recommend ibrutinib.  Discussed the potential side effects given history of A. Fib/anticoagulation with easy bruising.  Discussed that the potential side effects including but not limited to infections/fungal; neutropenia thrombocytopenia etc.  We will review interaction with amiodarone.  Understands treatments are palliative not curative/indefinite.   # A.fib- eliquis/Amiodarone- [Dr.Gollan].  Will discuss with cardiology.  #Fatigue-progressive likely secondary to progressive disease.  See plan above  # PET ASAP; follow to be decided.  # I reviewed the blood work- with the patient in detail; also reviewed the imaging independently [as summarized above]; and with the patient in detail.   # 40 minutes face-to-face with the patient discussing the above plan of care; more than 50% of time  spent on prognosis/ natural history; counseling and coordination.    Orders Placed This Encounter  Procedures  . NM PET Image Restag (PS) Skull Base To Thigh    Standing Status:   Future    Standing Expiration Date:   03/05/2019    Order Specific Question:   ** REASON FOR EXAM (FREE TEXT)    Answer:   CLL- ? transformation    Order Specific Question:   If indicated for the ordered procedure, I authorize the administration of a radiopharmaceutical per Radiology protocol    Answer:   Yes    Order Specific Question:   Preferred imaging location?    Answer:   [facility]    Order Specific Question:   Radiology Contrast Protocol - do NOT remove file path    Answer:   \\[email or web address]  . NM PET Image Initial (PI) Skull Base To Thigh    Standing Status:   Future    Standing Expiration Date:   03/05/2019    Order Specific Question:   If indicated for the ordered procedure, I authorize the administration of a radiopharmaceutical per Radiology protocol    Answer:   Yes    Order Specific Question:   Preferred imaging location?    Answer:   [facility]    Order Specific Question:   Radiology Contrast Protocol - do NOT remove file path    Answer:   \\[email or web address]    Order Specific Question:   **  REASON FOR EXAM (FREE TEXT)    Answer:   CLL/SLL- ? transformation   All questions were answered. The patient knows to call the clinic with any problems, questions or concerns.      Cammie Sickle, MD 03/09/2018 6:29 AM

## 2018-03-05 NOTE — Telephone Encounter (Addendum)
Oral Oncology Pharmacist Encounter  Received new prescription for Imbruvica (ibrutinib) for the treatment of CLL, planned duration until disease progression or unacceptable drug toxicity.  CBC from 02/18/18 assessed, no relevant lab abnormalities. Prescription dose and frequency assessed.   Current medication list in Epic reviewed, one DDIs with ibrutinib identified: - Ibrutinib may enhance the adverse/toxic effect of apixaban. Monitor pt for s/sx of bleeding.   Prescription has been e-scribed to the [facility] for benefits analysis and approval.  Oral Oncology Clinic will continue to follow for insurance authorization, copayment issues, initial counseling and start date.  Darl Pikes, PharmD, BCPS, [facility] Hematology/Oncology Clinical Pharmacist [facility] [phone]  03/05/2018 2:54 PM

## 2018-03-05 NOTE — Assessment & Plan Note (Addendum)
#  CLL/SLL likely recurrent based on increasing white count-32,000.  however hemoglobin stable around 11-12 platelets 141.  August 2019 CT scan shows-bilateral axilla adenopathy mediastinal adenopathy bulky abdominal/retroperitoneal adenopathy; splenomegaly.  Worsening.  #Proceed with PET scan to rule out transformation ASAP.  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  Recommend ibrutinib.  Discussed the potential side effects given history of A. Fib/anticoagulation with easy bruising.  Discussed that the potential side effects including but not limited to infections/fungal; neutropenia thrombocytopenia etc.  We will review interaction with amiodarone.  Understands treatments are palliative not curative/indefinite.   # A.fib- eliquis/Amiodarone- [Dr.Gollan].  Will discuss with cardiology.  #Fatigue-progressive likely secondary to progressive disease.  See plan above  # PET ASAP; follow to be decided.  # I reviewed the blood work- with the patient in detail; also reviewed the imaging independently [as summarized above]; and with the patient in detail.   # 40 minutes face-to-face with the patient discussing the above plan of care; more than 50% of time spent on prognosis/ natural history; counseling and coordination.

## 2018-03-05 NOTE — Telephone Encounter (Signed)
Oral Oncology Pharmacist Encounter   Received notification from [facility] that prior authorization for Imbruvica (ibrutinib) is required.   PA submitted on CMM Key [id number] Status is pending   Oral Oncology Clinic will continue to follow.   Darl Pikes, PharmD, BCPS, [facility] Hematology/Oncology Clinical Pharmacist [facility] [place] [facility] [phone]  03/05/2018 3:43 PM

## 2018-03-08 ENCOUNTER — Telehealth: Payer: Self-pay | Admitting: Pharmacy Technician

## 2018-03-08 DIAGNOSIS — D485 Neoplasm of uncertain behavior of skin: Secondary | ICD-10-CM | POA: Diagnosis not present

## 2018-03-08 DIAGNOSIS — Z08 Encounter for follow-up examination after completed treatment for malignant neoplasm: Secondary | ICD-10-CM | POA: Diagnosis not present

## 2018-03-08 DIAGNOSIS — D2271 Melanocytic nevi of right lower limb, including hip: Secondary | ICD-10-CM | POA: Diagnosis not present

## 2018-03-08 DIAGNOSIS — L400 Psoriasis vulgaris: Secondary | ICD-10-CM | POA: Diagnosis not present

## 2018-03-08 DIAGNOSIS — D2262 Melanocytic nevi of left upper limb, including shoulder: Secondary | ICD-10-CM | POA: Diagnosis not present

## 2018-03-08 DIAGNOSIS — C44519 Basal cell carcinoma of skin of other part of trunk: Secondary | ICD-10-CM | POA: Diagnosis not present

## 2018-03-08 DIAGNOSIS — D2261 Melanocytic nevi of right upper limb, including shoulder: Secondary | ICD-10-CM | POA: Diagnosis not present

## 2018-03-08 DIAGNOSIS — D2272 Melanocytic nevi of left lower limb, including hip: Secondary | ICD-10-CM | POA: Diagnosis not present

## 2018-03-08 DIAGNOSIS — Z85828 Personal history of other malignant neoplasm of skin: Secondary | ICD-10-CM | POA: Diagnosis not present

## 2018-03-08 NOTE — Telephone Encounter (Signed)
Oral Oncology Patient Advocate Encounter   Patient was approved for 2 grants to cover copay for [place].  Information is as follows:   1) Was successful in securing patient an $54 grant from Patient [employer] [facility]) to provide copayment coverage for Imbruvica.  This will keep the out of pocket expense at $0.     The billing information is as follows and has been shared with [place].   Member ID: [id number] Group ID: [id number] RxBin: [id number] Dates of Eligibility: 12/08/2017 through 03/08/2019   2) Was successful in securing patient a $8000 grant from [employer] to provide copayment coverage for [place]. This will keep the out of pocket expense at $0.   The billing information is as follows and has been shared with [place].   Member ID: [id number] Group ID: [id number] RxBin: [id number] PCN: [id number] Dates of Eligibility: 02/06/2018 through 02/06/2019  [place] Patient [place] Phone [phone] Fax [phone] 03/08/2018 10:50 AM

## 2018-03-08 NOTE — Telephone Encounter (Signed)
Oral Oncology Patient Advocate Encounter  Prior Authorization for [name] has been approved.    PA# [id number] Effective dates: 07/19/2017 through 07/20/2018.  Oral Oncology Clinic will continue to follow.   [place] Patient [place] Phone [phone] Fax [phone] 03/08/2018 9:02 AM

## 2018-03-09 ENCOUNTER — Telehealth: Payer: Self-pay | Admitting: Internal Medicine

## 2018-03-09 ENCOUNTER — Encounter
Admission: RE | Admit: 2018-03-09 | Discharge: 2018-03-09 | Disposition: A | Discharge status: Home / Self Care | Payer: Medicare HMO | Source: Ambulatory Visit | Attending: Internal Medicine | Admitting: Internal Medicine

## 2018-03-09 DIAGNOSIS — I700 Atherosclerosis of aorta: Secondary | ICD-10-CM | POA: Insufficient documentation

## 2018-03-09 DIAGNOSIS — I2510 Atherosclerotic heart disease of native coronary artery without angina pectoris: Secondary | ICD-10-CM | POA: Diagnosis not present

## 2018-03-09 DIAGNOSIS — C9110 Chronic lymphocytic leukemia of B-cell type not having achieved remission: Secondary | ICD-10-CM | POA: Diagnosis not present

## 2018-03-09 DIAGNOSIS — R161 Splenomegaly, not elsewhere classified: Secondary | ICD-10-CM | POA: Insufficient documentation

## 2018-03-09 DIAGNOSIS — C9111 Chronic lymphocytic leukemia of B-cell type in remission: Secondary | ICD-10-CM | POA: Diagnosis not present

## 2018-03-09 DIAGNOSIS — R590 Localized enlarged lymph nodes: Secondary | ICD-10-CM | POA: Diagnosis not present

## 2018-03-09 DIAGNOSIS — C9190 Lymphoid leukemia, unspecified not having achieved remission: Secondary | ICD-10-CM | POA: Diagnosis present

## 2018-03-09 LAB — GLUCOSE, CAPILLARY: Glucose-Capillary: 104 mg/dL — ABNORMAL HIGH (ref 70–99)

## 2018-03-09 IMAGING — CT NM PET TUM IMG INITIAL (PI) SKULL BASE T - THIGH
1 of 9 series · 1 of 25 positions shown · non-contrast
Comparison: [DATE]

CLINICAL DATA: Subsequent treatment strategy for chronic
lymphocytic leukemia.

EXAM:
NUCLEAR MEDICINE PET SKULL BASE TO THIGH
TECHNIQUE: 10.95 mCi F-18 FDG was injected intravenously. Full-ring PET imaging
was performed from the skull base to thigh after the radiotracer. CT
data was obtained and used for attenuation correction and anatomic
localization.
Fasting blood glucose: 104 mg/dl

[Series 3: ct wb 5.0 b30f · axial · 5.0mm · 0.98mm/px · 1 of 329 slices shown]
[im 329/329  brain]
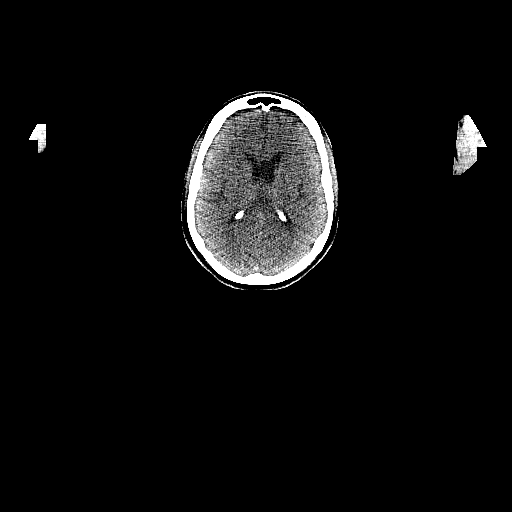

[1 of 25 positions shown; findings below may reference images not displayed]

FINDINGS: Mediastinal blood pool activity: SUV max

Liver activity: SUV max

NECK: Bilateral cervical adenopathy is identified. Index left level
2 node measures 1.7 cm with SUV max of 3.4. Index right level 2 node
measures 1.4 cm with SUV max of 3.4.

Incidental CT findings: none

CHEST: Extensive bilateral supraclavicular and axillary adenopathy.
Index right axillary lymph node measures 2.6 cm and has an SUV max
of 3.35. Index left axillary node measures 2.3 cm and has an SUV max
of 3.4. Index left supraclavicular lymph node measures 2.2 cm with
SUV max of 2.78. Right supraclavicular lymph node measures 1.5 cm
with SUV max of 2.21. Mildly hypermetabolic bilateral mediastinal
and hilar adenopathy. Index left hilar node measures 1.7 cm with SUV
max of 2.89. Index subcarinal lymph node measures 1.4 cm with SUV
max of 2.5.

No hypermetabolic pulmonary nodules or masses identified.

Incidental CT findings: Aortic atherosclerosis. Calcifications in
the left main coronary artery, LAD, left circumflex and RCA noted.

ABDOMEN/PELVIS: No abnormal radiotracer activity identified within
the liver. No abnormal uptake within the pancreas. The spleen
measures 12.1 by 6.5 by 13.1 cm (volume = 540 cm^3) No focal areas
of splenic hypermetabolism identified. Unremarkable appearance of
the adrenal glands.

Extensive abdominal adenopathy is identified centered around the
retroperitoneum and encasing the IVC and aorta. The tumor mass
measures 13.1 x 9.2 by 22 cm. SUV max is equal to 5.24. Bulky
bilateral pelvic sidewall adenopathy is also present. Index right
pelvic sidewall node measures 3.4 x 6.7 cm with= SUV max of 3.78.
The left pelvic sidewall lymph node measures 2.6 x 7.6 cm with SUV
max of 3.4.

Incidental CT findings: Previous bilateral inguinal herniorrhaphy.
Aortic atherosclerosis is identified. Previous cholecystectomy.

SKELETON: No focal hypermetabolic activity to suggest skeletal
metastasis.

Incidental CT findings: Status post bilateral hip arthroplasty.
IMPRESSION: 1. Examination is positive for extensive adenopathy within the neck,
chest, abdomen and pelvis. The tumor within the abdomen has an SUV
max of 5.24 compatible with [HOSPITAL] criteria 4.
2. Splenomegaly.
3.  Aortic Atherosclerosis ([QR]-[QR]).
4. Multi vessel coronary artery calcifications including left main
disease.

## 2018-03-09 MED ORDER — FLUDEOXYGLUCOSE F - 18 (FDG) INJECTION
11.0000 | Freq: Once | INTRAVENOUS | Status: AC | PRN
  Administered 2018-03-09: 10.95 via INTRAVENOUS

## 2018-03-09 MED FILL — IMBRUVICA 420 MG TAB: 420 | 28 days supply | Qty: 28 | Fill #0

## 2018-03-09 NOTE — Telephone Encounter (Addendum)
Oral Chemotherapy Pharmacist Encounter   There is no interaction between ibrutinib and amiodarone. The only ibrutinib related drug-drug interaction in his current medication list is with apixaban. Ibrutinib may enhance the adverse/toxic effect of apixaban. Monitor pt for s/sx of bleeding.   Dr. Rogue Bussing, would you like for me to have proceed with medication delivery for this patient and have him bring the medication with him to his appt or wait until after you see him?   Darl Pikes, PharmD, BCPS, [facility] Hematology/Oncology Clinical Pharmacist [facility] [phone]  03/09/2018 8:29 AM

## 2018-03-09 NOTE — Telephone Encounter (Signed)
Please have the patient follow-up with me on 8/22 at 830; no labs.  To review PET scan/treatment plan.  Ebony Hail please check any drug interaction with ibrutinib & amiodarone.  I plan to do single agent ibrutinib.   Thx GB

## 2018-03-09 NOTE — Telephone Encounter (Signed)
[name] ,it is okay to go ahead and fill the prescription for ibrutinib; and have the patient bring the pills if available to him to the next visit. Thx GB

## 2018-03-10 NOTE — Telephone Encounter (Signed)
Oral Chemotherapy Pharmacist Encounter  Patient Education I spoke with patient for overview of new oral chemotherapy medication: Imbruvica (ibrutinib) for the treatment of CLL, planned duration until disease progression or unacceptable drug toxicity.   Patient instructedto not start his medication until instructed to begin by Dr. Rogue Bussing. At his next office visit I will meet with him, provide a more in depth education, and give him an ibrutinib medication handout.   Darl Pikes, PharmD, BCPS, [facility] Hematology/Oncology Clinical Pharmacist [facility] [phone]  03/10/2018 1:18 PM

## 2018-03-12 ENCOUNTER — Telehealth: Payer: Self-pay | Admitting: Internal Medicine

## 2018-03-12 ENCOUNTER — Telehealth: Payer: Self-pay | Admitting: *Deleted

## 2018-03-12 DIAGNOSIS — C9110 Chronic lymphocytic leukemia of B-cell type not having achieved remission: Secondary | ICD-10-CM

## 2018-03-12 NOTE — Telephone Encounter (Signed)
Spoke to pt- has ibrutinib available. Recommend starting 1 pill a day.   Follow-up with me on 8/29; at 845. With labs- cbc/bmp.

## 2018-03-12 NOTE — Telephone Encounter (Addendum)
Patient called stating he wants to speak with someone regarding his cancer. He does not have follow up appointment and had PET 8/20. Please return his call [phone]   IMPRESSION: 1. Examination is positive for extensive adenopathy within the neck, chest, abdomen and pelvis. The tumor within the abdomen has an SUV max of 5.24 compatible with [place] criteria 4. 2. Splenomegaly. 3.  Aortic Atherosclerosis (ICD10-I70.0). 4. Multi vessel coronary artery calcifications including left main disease.   Electronically Signed   By: Kerby Moors M.D.   On: 03/09/2018 16:56

## 2018-03-15 NOTE — Addendum Note (Signed)
Addended by: Sabino Gasser on: 03/15/2018 11:33 AM   Modules accepted: Orders

## 2018-03-15 NOTE — Telephone Encounter (Signed)
md spoke with patient 8/23

## 2018-03-18 ENCOUNTER — Inpatient Hospital Stay: Payer: Non-veteran care

## 2018-03-18 ENCOUNTER — Inpatient Hospital Stay (HOSPITAL_BASED_OUTPATIENT_CLINIC_OR_DEPARTMENT_OTHER): Payer: Medicare HMO | Admitting: Internal Medicine

## 2018-03-18 ENCOUNTER — Inpatient Hospital Stay: Payer: Medicare HMO

## 2018-03-18 ENCOUNTER — Other Ambulatory Visit: Payer: Self-pay

## 2018-03-18 VITALS — BP 153/81 | HR 68 | Temp 97.1°F | Resp 16 | Wt 207.6 lb

## 2018-03-18 DIAGNOSIS — Z7901 Long term (current) use of anticoagulants: Secondary | ICD-10-CM

## 2018-03-18 DIAGNOSIS — Z87891 Personal history of nicotine dependence: Secondary | ICD-10-CM | POA: Diagnosis not present

## 2018-03-18 DIAGNOSIS — M2550 Pain in unspecified joint: Secondary | ICD-10-CM | POA: Diagnosis not present

## 2018-03-18 DIAGNOSIS — R5383 Other fatigue: Secondary | ICD-10-CM | POA: Diagnosis not present

## 2018-03-18 DIAGNOSIS — I4891 Unspecified atrial fibrillation: Secondary | ICD-10-CM

## 2018-03-18 DIAGNOSIS — C9110 Chronic lymphocytic leukemia of B-cell type not having achieved remission: Secondary | ICD-10-CM | POA: Diagnosis not present

## 2018-03-18 DIAGNOSIS — G8929 Other chronic pain: Secondary | ICD-10-CM | POA: Diagnosis not present

## 2018-03-18 DIAGNOSIS — I10 Essential (primary) hypertension: Secondary | ICD-10-CM | POA: Diagnosis not present

## 2018-03-18 DIAGNOSIS — M545 Low back pain: Secondary | ICD-10-CM | POA: Diagnosis not present

## 2018-03-18 DIAGNOSIS — Z79899 Other long term (current) drug therapy: Secondary | ICD-10-CM | POA: Diagnosis not present

## 2018-03-18 LAB — CBC WITH DIFFERENTIAL/PLATELET
BASOS PCT: 0 %
Basophils Absolute: 0 10*3/uL (ref 0–0.1)
EOS PCT: 0 %
Eosinophils Absolute: 0 10*3/uL (ref 0–0.7)
HCT: 33.9 % — ABNORMAL LOW (ref 40.0–52.0)
Hemoglobin: 10.8 g/dL — ABNORMAL LOW (ref 13.0–18.0)
Lymphocytes Relative: 95 %
Lymphs Abs: 86.6 10*3/uL — ABNORMAL HIGH (ref 1.0–3.6)
MCH: 32.5 pg (ref 26.0–34.0)
MCHC: 32 g/dL (ref 32.0–36.0)
MCV: 101.6 fL — AB (ref 80.0–100.0)
MONO ABS: 0.9 10*3/uL (ref 0.2–1.0)
Monocytes Relative: 1 %
NEUTROS PCT: 4 %
Neutro Abs: 3.6 10*3/uL (ref 1.4–6.5)
PLATELETS: 117 10*3/uL — AB (ref 150–440)
RBC: 3.34 MIL/uL — ABNORMAL LOW (ref 4.40–5.90)
RDW: 16.9 % — ABNORMAL HIGH (ref 11.5–14.5)
WBC: 91.1 10*3/uL (ref 3.8–10.6)

## 2018-03-18 LAB — BASIC METABOLIC PANEL
Anion gap: 6 (ref 5–15)
BUN: 16 mg/dL (ref 8–23)
CHLORIDE: 107 mmol/L (ref 98–111)
CO2: 27 mmol/L (ref 22–32)
CREATININE: 0.97 mg/dL (ref 0.61–1.24)
Calcium: 8.8 mg/dL — ABNORMAL LOW (ref 8.9–10.3)
GFR calc non Af Amer: >60 mL/min (ref >60)
GLUCOSE: 116 mg/dL — AB (ref 70–99)
Potassium: 4.2 mmol/L (ref 3.5–5.1)
Sodium: 140 mmol/L (ref 135–145)

## 2018-03-18 NOTE — Assessment & Plan Note (Addendum)
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   # On Ibrutinib 420 mg/day- tolerating well-except for mild nausea/fatigue.  Lymphocytosis white count 90/hemoglobin 11/platelets 117-likely secondary to ibrutinib.  Monitor CBC on a weekly basis.  # A.fib- eliquis/Amiodarone- [Dr.Gollan]. No drug interactions  Left message for cardiology.  # Fatigue-progressive likely secondary to progressive disease.  Stable.  # Elevated Blood pressure-worse.  150s/80; recommend keeping a log.  If consistently above 166 systolic-to call cardiology for titration of blood pressure medication.  I have left a message for Dr. Rockey Situ.  # follow up in 3 weeks/labscbc/bmp- weekly cbc.   # I reviewed the blood work- with the patient in detail; also reviewed the imaging independently [as summarized above]; and with the patient in detail.    Cc; dr.Gollan

## 2018-03-18 NOTE — Progress Notes (Signed)
[facility] OFFICE PROGRESS NOTE  Patient Care Team: Leone Haven, MD as PCP - General (Family Medicine) [name], MD as Consulting Physician (Cardiology)  Cancer Staging No matching staging information was found for the patient.   Oncology History   # AUG 2015- SLL/CLL [Right Ax Ln Bx] s/p Benda-Rituxan x6 [finished March 2016]; Maintenance Rituxan q 65m[started April 2016; Dr.Pandit];Last Ritux Jan 2017.  MARCH 2017- CT N/C/A/P- NED. STOP Ritux; surveillance   # AUG 2019- CT/PET- progression/NO transformation   # s/p PPM [Dr.Klein; Sep 2017]; A.fib [on eliquis]  # MAY 2019- 65% OF NUCLEI POSITIVE FOR ATM DELETION; 53% OF NUCLEI POSITIVE FOR TP53 DELETION; IGVH- UN-MUTATED [poor prognosis]  DIAGNOSIS: CLL  STAGE: IV         ;GOALS: control  CURRENT/MOST RECENT THERAPY : Ibrutinib [ aug 21st]      CLL (chronic lymphocytic leukemia) ([place]      INTERVAL HISTORY:  Michael OLANDER89y.o.  male CLL-high risk is here to review the results of his PET scan.  Patient started ibrutinib for 20 mg a day approximately 6 days ago.  He continues to complain of fatigue.  Mild nausea for which she had to take nausea medication.  Denies any worsening bruising.  No cough no fevers or chills.  Feels tender in his lymph nodes in the underarm.  Review of Systems  Constitutional: Positive for malaise/fatigue and weight loss. Negative for chills, diaphoresis and fever.  HENT: Negative for nosebleeds and sore throat.   Eyes: Negative for double vision.  Respiratory: Negative for cough, hemoptysis, sputum production, shortness of breath and wheezing.   Cardiovascular: Negative for chest pain, palpitations, orthopnea and leg swelling.  Gastrointestinal: Negative for abdominal pain, blood in stool, constipation, diarrhea, heartburn, melena, nausea and vomiting.  Genitourinary: Negative for dysuria, frequency and urgency.  Musculoskeletal: Positive for back pain and  joint pain.  Skin: Negative.  Negative for itching and rash.  Neurological: Negative for dizziness, tingling, focal weakness, weakness and headaches.  Endo/Heme/Allergies: Bruises/bleeds easily.  Psychiatric/Behavioral: Negative for depression. The patient is not nervous/anxious and does not have insomnia.       PAST MEDICAL HISTORY :  Past Medical History:  Diagnosis Date  . Anxiety   . Arthritis   . Atrial fibrillation ([place]    a. Dx 2013, recurred 02/2014, CHA2DS2VASc = 3 -->placed on Eliquis;  b. 02/2014 Echo: EF 50-55%, mid and apical anterior septum and mid and apical inf septum are abnl, mild to mod Ao sclerosis w/o AS.  .Marland KitchenChicken pox   . Chronic lymphocytic leukemia ([place]    a. Dx 02/2014.  .Marland KitchenComplication of anesthesia    History of  PTSD--do not touch patient when waking up from surgery.  .Marland KitchenCOPD (chronic obstructive pulmonary disease) ([place]   . Coronary artery disease    a. 04/2009 CABG x 3 (LIMA->LAD, VG->OM1, VG->PDA);  b. 09/2009 Cath: occluded VG x 2 w/ patent LIMA and L->R collats. EF 55%, mild antlat HK;  c. 10/2011 MV: EF 53%, no isch/infarct-->low risk.  .Marland KitchenDysrhythmia    hx of a-fib  . GERD (gastroesophageal reflux disease)    occasional  . History of chemotherapy 2015-2016  . HOH (hard of hearing)    Bilateral Hearing Aids  . Hypertension   . [place]ocardial infarction ([place] 2010  . OSA on CPAP    USE C-PAP  . Presence of permanent cardiac pacemaker 2017  . PTSD (post-traumatic stress disorder)   .  PTSD (post-traumatic stress disorder)   . Pure hypercholesterolemia   . Rheumatic fever 1959    PAST SURGICAL HISTORY :   Past Surgical History:  Procedure Laterality Date  . ABDOMINAL HERNIA REPAIR    . APPENDECTOMY  06/21/1985  . CARDIAC CATHETERIZATION  2010; 2011   ; Dr Fletcher Anon  . CORONARY ARTERY BYPASS GRAFT  04/2009   "CABG X3"  . EP IMPLANTABLE DEVICE N/A 03/03/2016   Procedure: Pacemaker Implant;  Surgeon: Deboraha Sprang, MD;  Location: [facility];   Service: Cardiovascular;  Laterality: N/A;  . FOREIGN BODY REMOVAL  1968   "shrapnel in my tailbone"  . INGUINAL HERNIA REPAIR Right   . INSERT / REPLACE / REMOVE PACEMAKER    . JOINT REPLACEMENT Right 2018  . LAPAROSCOPIC CHOLECYSTECTOMY    . TONSILLECTOMY AND ADENOIDECTOMY  1956  . TOTAL HIP ARTHROPLASTY Right 10/22/2016   Procedure: TOTAL HIP ARTHROPLASTY;  Surgeon: Dereck Leep, MD;  Location: [facility];  Service: Orthopedics;  Laterality: Right;  . TOTAL HIP ARTHROPLASTY Left 11/04/2017   Procedure: TOTAL HIP ARTHROPLASTY;  Surgeon: Dereck Leep, MD;  Location: [facility];  Service: Orthopedics;  Laterality: Left;    FAMILY HISTORY :   Family History  Problem Relation Age of Onset  . Heart disease Mother   . Coronary artery disease Unknown        family history    SOCIAL HISTORY:   Social History   Tobacco Use  . Smoking status: Former Smoker    Packs/day: 1.00    Years: 40.00    Pack years: 40.00    Types: Cigarettes    Last attempt to quit: 07/21/2006    Years since quitting: 11.6  . Smokeless tobacco: Never Used  Substance Use Topics  . Alcohol use: Yes    Alcohol/week: 1.0 standard drinks    Types: 1 Standard drinks or equivalent per week    Comment: rarely  . Drug use: No    ALLERGIES:  has No Known Allergies.  MEDICATIONS:  Current Outpatient Medications  Medication Sig Dispense Refill  . acetaminophen (TYLENOL) 500 MG tablet Take 1,000 mg by mouth every 8 (eight) hours as needed for mild pain.    Marland Kitchen albuterol (PROVENTIL HFA;VENTOLIN HFA) 108 (90 Base) MCG/ACT inhaler Inhale 2 puffs into the lungs every 6 (six) hours as needed for wheezing or shortness of breath. 1 Inhaler 11  . amiodarone (PACERONE) 200 MG tablet Take 0.5 tablets (100 mg total) by mouth 2 (two) times daily. 30 tablet 3  . apixaban (ELIQUIS) 5 MG TABS tablet Take 1 tablet (5 mg total) by mouth 2 (two) times daily. 180 tablet 3  . atorvastatin (LIPITOR) 80 MG tablet Take 1 tablet (80 mg total)  by mouth at bedtime. 90 tablet 3  . budesonide-formoterol (SYMBICORT) 160-4.5 MCG/ACT inhaler Inhale 2 puffs into the lungs 2 (two) times daily. 1 Inhaler 11  . cetirizine (ZYRTEC) 10 MG tablet Take 10 mg by mouth daily as needed for allergies.     . Coenzyme Q10 (COQ10) 200 MG CAPS Take 200 mg by mouth daily.    Marland Kitchen ezetimibe (ZETIA) 10 MG tablet Take 1 tablet (10 mg total) by mouth daily. 90 tablet 3  . Ibrutinib 420 MG TABS Take 420 mg by mouth daily. 28 tablet 3  . Krill Oil 350 MG CAPS Take 350 mg by mouth every evening.    . metoprolol tartrate (LOPRESSOR) 25 MG tablet Take 25 mg by mouth  2 (two) times daily.    . mirtazapine (REMERON) 15 MG tablet Take 15 mg by mouth at bedtime as needed (for panic associated with PTSD).     . Multiple Vitamin (MULTIVITAMIN WITH MINERALS) TABS tablet Take 1 tablet by mouth daily.    . mupirocin ointment (BACTROBAN) 2 % Apply 1 application topically 3 (three) times daily. 30 g 1  . nitroGLYCERIN (NITROSTAT) 0.4 MG SL tablet Place 1 tablet (0.4 mg total) under the tongue every 5 (five) minutes as needed for chest pain. 25 tablet 6  . tiotropium (SPIRIVA) 18 MCG inhalation capsule Place 1 capsule (18 mcg total) into inhaler and inhale at bedtime. 30 capsule 11  . traMADol (ULTRAM) 50 MG tablet TAKE 1 TABLET BY MOUTH EVERY 12 HOURS AS NEEDED FOR MODERATE PAIN 90 tablet 0   No current facility-administered medications for this visit.     PHYSICAL EXAMINATION: ECOG PERFORMANCE STATUS: 1 - Symptomatic but completely ambulatory  BP (!) 153/81 (BP Location: Left Arm, Patient Position: Sitting)   Pulse 68   Temp (!) 97.1 F (36.2 C) (Tympanic)   Resp 16   Wt 207 lb 9.6 oz (94.2 kg)   BMI 30.66 kg/m   Filed Weights   03/18/18 0841  Weight: 207 lb 9.6 oz (94.2 kg)    Physical Exam  Constitutional: He is oriented to person, place, and time and well-developed, well-nourished, and in no distress.  HENT:  Head: Normocephalic and atraumatic.  Mouth/Throat:  Oropharynx is clear and moist. No oropharyngeal exudate.  Eyes: Pupils are equal, round, and reactive to light.  Neck: Normal range of motion. Neck supple.  Cardiovascular: Normal rate and regular rhythm.  Pulmonary/Chest: No respiratory distress. He has no wheezes.  Abdominal: Soft. Bowel sounds are normal. He exhibits no distension and no mass. There is no tenderness. There is no rebound and no guarding.  Musculoskeletal: Normal range of motion. He exhibits no edema or tenderness.  Lymphadenopathy:  Positive for bilateral neck adenopathy left more than right.  Positive for bilateral axillary adenopathy approximately 2 cm in size.  Neurological: He is alert and oriented to person, place, and time.  Skin: Skin is warm.  Psychiatric: Affect normal.       LABORATORY DATA:  I have reviewed the data as listed    Component Value Date/Time   NA 140 03/18/2018 0810   NA 139 10/11/2014 1800   K 4.2 03/18/2018 0810   K 3.3 (L) 10/11/2014 1800   CL 107 03/18/2018 0810   CL 106 10/11/2014 1800   CO2 27 03/18/2018 0810   CO2 27 10/11/2014 1800   GLUCOSE 116 (H) 03/18/2018 0810   GLUCOSE 107 (H) 10/11/2014 1800   BUN 16 03/18/2018 0810   BUN 15 10/11/2014 1800   CREATININE 0.97 03/18/2018 0810   CREATININE 0.89 10/11/2014 1800   CALCIUM 8.8 (L) 03/18/2018 0810   CALCIUM 8.8 (L) 10/11/2014 1800   PROT 6.7 02/18/2018 1051   PROT 6.7 05/18/2017 1048   PROT 6.4 (L) 10/11/2014 1800   ALBUMIN 4.0 02/18/2018 1051   ALBUMIN 4.3 05/18/2017 1048   ALBUMIN 4.1 10/11/2014 1800   AST 21 02/18/2018 1051   AST 23 10/11/2014 1800   ALT 21 02/18/2018 1051   ALT 22 10/11/2014 1800   ALKPHOS 76 02/18/2018 1051   ALKPHOS 61 10/11/2014 1800   BILITOT 0.8 02/18/2018 1051   BILITOT 0.7 05/18/2017 1048   BILITOT 0.9 10/11/2014 1800   GFRNONAA >60 03/18/2018 0810   GFRNONAA >  60 10/11/2014 1800   GFRAA >60 03/18/2018 0810   GFRAA >60 10/11/2014 1800    No results found for: SPEP, UPEP  Lab  Results  Component Value Date   WBC 91.1 (HH) 03/18/2018   NEUTROABS 3.6 03/18/2018   HGB 10.8 (L) 03/18/2018   HCT 33.9 (L) 03/18/2018   MCV 101.6 (H) 03/18/2018   PLT 117 (L) 03/18/2018      Chemistry      Component Value Date/Time   NA 140 03/18/2018 0810   NA 139 10/11/2014 1800   K 4.2 03/18/2018 0810   K 3.3 (L) 10/11/2014 1800   CL 107 03/18/2018 0810   CL 106 10/11/2014 1800   CO2 27 03/18/2018 0810   CO2 27 10/11/2014 1800   BUN 16 03/18/2018 0810   BUN 15 10/11/2014 1800   CREATININE 0.97 03/18/2018 0810   CREATININE 0.89 10/11/2014 1800      Component Value Date/Time   CALCIUM 8.8 (L) 03/18/2018 0810   CALCIUM 8.8 (L) 10/11/2014 1800   ALKPHOS 76 02/18/2018 1051   ALKPHOS 61 10/11/2014 1800   AST 21 02/18/2018 1051   AST 23 10/11/2014 1800   ALT 21 02/18/2018 1051   ALT 22 10/11/2014 1800   BILITOT 0.8 02/18/2018 1051   BILITOT 0.7 05/18/2017 1048   BILITOT 0.9 10/11/2014 1800       RADIOGRAPHIC STUDIES: I have personally reviewed the radiological images as listed and agreed with the findings in the report. No results found.   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   # On Ibrutinib 420 mg/day- tolerating well-except for mild nausea/fatigue.  Lymphocytosis white count 90/hemoglobin 11/platelets 117-likely secondary to ibrutinib.  Monitor CBC on a weekly basis.  # A.fib- eliquis/Amiodarone- [Dr.Gollan]. No drug interactions  Left message for cardiology.  # Fatigue-progressive likely secondary to progressive disease.  Stable.  # Elevated Blood pressure-worse.  150s/80; recommend keeping a log.  If consistently above 803 systolic-to call cardiology for titration of blood pressure medication.  I have left a message for  Dr. Rockey Situ.  # follow up in 3 weeks/labscbc/bmp- weekly cbc.   # I reviewed the blood work- with the patient in detail; also reviewed the imaging independently [as summarized above]; and with the patient in detail.    Cc; dr.Gollan     Orders Placed This Encounter  Procedures  . CBC with Differential/Platelet    Standing Status:   Standing    Number of Occurrences:   10    Standing Expiration Date:   03/19/2019  . CBC with Differential/Platelet    Standing Status:   Future    Standing Expiration Date:   03/19/2019  . Comprehensive metabolic panel    Standing Status:   Future    Standing Expiration Date:   03/19/2019  . Lactate dehydrogenase    Standing Status:   Future    Standing Expiration Date:   03/19/2019   All questions were answered. The patient knows to call the clinic with any problems, questions or concerns.      Cammie Sickle, MD 03/18/2018 9:55 AM

## 2018-03-19 ENCOUNTER — Ambulatory Visit (INDEPENDENT_AMBULATORY_CARE_PROVIDER_SITE_OTHER): Payer: Medicare HMO

## 2018-03-19 VITALS — BP 138/72 | HR 63 | Temp 98.2°F | Resp 16 | Ht 68.0 in | Wt 210.8 lb

## 2018-03-19 DIAGNOSIS — Z0000 Encounter for general adult medical examination without abnormal findings: Secondary | ICD-10-CM | POA: Diagnosis not present

## 2018-03-19 NOTE — Progress Notes (Signed)
Subjective:   [name] is a 72 y.o. male who presents for Medicare Annual/Subsequent preventive examination.  Review of Systems:  No ROS.  Medicare Wellness Visit. Additional risk factors are reflected in the social history.  Cardiac Risk Factors include: advanced age (>16men, >49 women);hypertension;male gender     Objective:    Vitals: BP 138/72 (BP Location: Left Arm, Patient Position: Sitting, Cuff Size: Normal)   Pulse 63   Temp 98.2 F (36.8 C) (Oral)   Resp 16   Ht 5\' 8"  (1.727 m)   Wt 210 lb 12.8 oz (95.6 kg)   SpO2 96%   BMI 32.05 kg/m   Body mass index is 32.05 kg/m.  Advanced Directives 03/19/2018 03/18/2018 11/19/2017 11/04/2017 10/20/2017 08/14/2017 02/11/2017  Does Patient Have a Medical Advance Directive? Yes Yes Yes Yes - Yes Yes  Type of Advance Directive [place];Living will Living will;Healthcare Power of Attorney Living will;Healthcare Power of [place];Living will [place];Living will Living will -  Does patient want to make changes to medical advance directive? No - Patient declined No - Patient declined No - Patient declined No - Patient declined No - Patient declined No - Patient declined -  Copy of [place] in Chart? No - copy requested No - copy requested Yes No - copy requested No - copy requested No - copy requested -  Would patient like information on creating a medical advance directive? - - - - - No - Patient declined -    Tobacco Social History   Tobacco Use  Smoking Status Former Smoker  . Packs/day: 1.00  . Years: 40.00  . Pack years: 40.00  . Types: Cigarettes  . Last attempt to quit: 07/21/2006  . Years since quitting: 11.6  Smokeless Tobacco Never Used     Counseling given: Not Answered   Clinical Intake:  Pre-visit preparation completed: Yes  Pain : 0-10 Pain Score: 3  Pain Type: Chronic pain Pain Location: Knee Pain Radiating Towards:  Both knees Pain Onset: More than a month ago Pain Frequency: Intermittent Pain Relieving Factors: Tylenol. Tramadol. Rest. Followed by pcp. Declines follow up appointment for this today.  Effect of Pain on Daily Activities: He paces himself.  Pain Relieving Factors: Tylenol. Tramadol. Rest. Followed by pcp. Declines follow up appointment for this today.   Nutritional Status: BMI > 30  Obese Diabetes: No  How often do you need to have someone help you when you read instructions, pamphlets, or other written materials from your doctor or pharmacy?: 1 - Never  Interpreter Needed?: No     Past Medical History:  Diagnosis Date  . Anxiety   . Arthritis   . Atrial fibrillation ([place])    a. Dx 2013, recurred 02/2014, CHA2DS2VASc = 3 -->placed on Eliquis;  b. 02/2014 Echo: EF 50-55%, mid and apical anterior septum and mid and apical inf septum are abnl, mild to mod Ao sclerosis w/o AS.  Marland Kitchen Chicken pox   . Chronic lymphocytic leukemia ([place])    a. Dx 02/2014.  Marland Kitchen Complication of anesthesia    History of  PTSD--do not touch patient when waking up from surgery.  Marland Kitchen COPD (chronic obstructive pulmonary disease) ([place])   . Coronary artery disease    a. 04/2009 CABG x 3 (LIMA->LAD, VG->OM1, VG->PDA);  b. 09/2009 Cath: occluded VG x 2 w/ patent LIMA and L->R collats. EF 55%, mild antlat HK;  c. 10/2011 MV: EF 53%,  no isch/infarct-->low risk.  Marland Kitchen Dysrhythmia    hx of a-fib  . GERD (gastroesophageal reflux disease)    occasional  . History of chemotherapy 2015-2016  . HOH (hard of hearing)    Bilateral Hearing Aids  . Hypertension   . Myocardial infarction ([place]) 2010  . OSA on CPAP    USE C-PAP  . Presence of permanent cardiac pacemaker 2017  . PTSD (post-traumatic stress disorder)   . PTSD (post-traumatic stress disorder)   . Pure hypercholesterolemia   . Rheumatic fever 1959   Past Surgical History:  Procedure Laterality Date  . ABDOMINAL HERNIA REPAIR    . APPENDECTOMY  06/21/1985  . CARDIAC  CATHETERIZATION  2010; 2011   ; Dr Fletcher Anon  . CORONARY ARTERY BYPASS GRAFT  04/2009   "CABG X3"  . EP IMPLANTABLE DEVICE N/A 03/03/2016   Procedure: Pacemaker Implant;  Surgeon: Deboraha Sprang, MD;  Location: [facility];  Service: Cardiovascular;  Laterality: N/A;  . FOREIGN BODY REMOVAL  1968   "shrapnel in my tailbone"  . INGUINAL HERNIA REPAIR Right   . INSERT / REPLACE / REMOVE PACEMAKER    . JOINT REPLACEMENT Right 2018  . LAPAROSCOPIC CHOLECYSTECTOMY    . TONSILLECTOMY AND ADENOIDECTOMY  1956  . TOTAL HIP ARTHROPLASTY Right 10/22/2016   Procedure: TOTAL HIP ARTHROPLASTY;  Surgeon: Dereck Leep, MD;  Location: [facility];  Service: Orthopedics;  Laterality: Right;  . TOTAL HIP ARTHROPLASTY Left 11/04/2017   Procedure: TOTAL HIP ARTHROPLASTY;  Surgeon: Dereck Leep, MD;  Location: [facility];  Service: Orthopedics;  Laterality: Left;   Family History  Problem Relation Age of Onset  . Heart disease Mother   . Heart attack Mother   . Coronary artery disease Unknown        family history   Social History   Socioeconomic History  . Marital status: Married    Spouse name: Not on file  . Number of children: Not on file  . Years of education: Not on file  . Highest education level: Not on file  Occupational History  . Occupation: retired    Fish farm manager: Transport planner    Comment: [place]  . Financial resource strain: Not hard at all  . Food insecurity:    Worry: Never true    Inability: Never true  . Transportation needs:    Medical: No    Non-medical: No  Tobacco Use  . Smoking status: Former Smoker    Packs/day: 1.00    Years: 40.00    Pack years: 40.00    Types: Cigarettes    Last attempt to quit: 07/21/2006    Years since quitting: 11.6  . Smokeless tobacco: Never Used  Substance and Sexual Activity  . Alcohol use: Yes    Alcohol/week: 1.0 standard drinks    Types: 1 Standard drinks or equivalent per week    Comment: rarely   . Drug use: No  . Sexual activity: Not Currently  Lifestyle  . Physical activity:    Days per week: 7 days    Minutes per session: 30 min  . Stress: Not on file  Relationships  . Social connections:    Talks on phone: Not on file    Gets together: Not on file    Attends religious service: Not on file    Active member of club or organization: Not on file    Attends meetings of clubs or organizations: Not on file  Relationship status: Not on file  Other Topics Concern  . Not on file  Social History Narrative   Lives in [place] with wife. Dogs. Goats. Children - 4. Grandchildren - 7.      Worked - Sports coach, part time.   Diet - healthy   Exercise - very active      Service - [employer] - [place]    Outpatient Encounter Medications as of 03/19/2018  Medication Sig  . acetaminophen (TYLENOL) 500 MG tablet Take 1,000 mg by mouth every 8 (eight) hours as needed for mild pain.  Marland Kitchen albuterol (PROVENTIL HFA;VENTOLIN HFA) 108 (90 Base) MCG/ACT inhaler Inhale 2 puffs into the lungs every 6 (six) hours as needed for wheezing or shortness of breath.  Marland Kitchen amiodarone (PACERONE) 200 MG tablet Take 0.5 tablets (100 mg total) by mouth 2 (two) times daily.  Marland Kitchen apixaban (ELIQUIS) 5 MG TABS tablet Take 1 tablet (5 mg total) by mouth 2 (two) times daily.  Marland Kitchen atorvastatin (LIPITOR) 80 MG tablet Take 1 tablet (80 mg total) by mouth at bedtime.  . budesonide-formoterol (SYMBICORT) 160-4.5 MCG/ACT inhaler Inhale 2 puffs into the lungs 2 (two) times daily.  . cetirizine (ZYRTEC) 10 MG tablet Take 10 mg by mouth daily as needed for allergies.   . Coenzyme Q10 (COQ10) 200 MG CAPS Take 200 mg by mouth daily.  Marland Kitchen ezetimibe (ZETIA) 10 MG tablet Take 1 tablet (10 mg total) by mouth daily.  . Ibrutinib 420 MG TABS Take 420 mg by mouth daily.  Javier Docker Oil 350 MG CAPS Take 350 mg by mouth every evening.  . metoprolol tartrate (LOPRESSOR) 25 MG tablet Take 25 mg by mouth 2 (two) times daily.  . mirtazapine  (REMERON) 15 MG tablet Take 15 mg by mouth at bedtime as needed (for panic associated with PTSD).   . Multiple Vitamin (MULTIVITAMIN WITH MINERALS) TABS tablet Take 1 tablet by mouth daily.  . mupirocin ointment (BACTROBAN) 2 % Apply 1 application topically 3 (three) times daily.  . nitroGLYCERIN (NITROSTAT) 0.4 MG SL tablet Place 1 tablet (0.4 mg total) under the tongue every 5 (five) minutes as needed for chest pain.  Marland Kitchen tiotropium (SPIRIVA) 18 MCG inhalation capsule Place 1 capsule (18 mcg total) into inhaler and inhale at bedtime.  . traMADol (ULTRAM) 50 MG tablet TAKE 1 TABLET BY MOUTH EVERY 12 HOURS AS NEEDED FOR MODERATE PAIN   No facility-administered encounter medications on file as of 03/19/2018.     Activities of Daily Living In your present state of health, do you have any difficulty performing the following activities: 03/19/2018 11/04/2017  Hearing? Y N  Comment Hearing aid, bilateral -  Vision? N N  Difficulty concentrating or making decisions? N N  Walking or climbing stairs? Y Y  Comment Intermittent Knee pain, bilateral -  Dressing or bathing? N N  Doing errands, shopping? N N  Preparing Food and eating ? N -  Using the Toilet? N -  In the past six months, have you accidently leaked urine? N -  Do you have problems with loss of bowel control? N -  Managing your Medications? N -  Managing your Finances? N -  Housekeeping or managing your Housekeeping? N -  Some recent data might be hidden    Patient Care Team: Leone Haven, MD as PCP - General (Family Medicine) [name], MD as Consulting Physician (Cardiology)   Assessment:   This is a routine wellness examination for [facility].  The goal  of the wellness visit is to assist the patient how to close the gaps in care and create a preventative care plan for the patient.   No acute issues to address today.  Recovering well from hip replacement. Keeping all routine maintenance appointments.   The roster of  all physicians providing medical care to patient is listed in the Snapshot section of the chart.  Osteoporosis risk reviewed.    Safety issues reviewed; Smoke and carbon monoxide detectors in the home. Firearm safety discussed. Wears seatbelts when driving or riding with others. No violence in the home.  They do not have excessive sun exposure.  Discussed the need for sun protection: hats, long sleeves and the use of sunscreen if there is significant sun exposure.  Patient is alert, normal appearance, oriented to person/place/and time.  Correctly identified the president of the [place] and recalls of 2/3 words. Performs simple calculations and can read correct time from watch face.  Displays appropriate judgement.  No new identified risk were noted.  No failures at ADL's or IADL's.  Ambulates with cane or walking stick as needed.   BMI- discussed the importance of a healthy diet, water intake and the benefits of aerobic exercise. Educational material provided.   24 hour diet recall: Regular diet  Sleep patterns- Sleeps without issues.   Hep C screening declined.   Influenza vaccine discussed. He plans to receive later in the season.   Health maintenance gaps- closed.  Patient Concerns: None at this time. Follow up with PCP as needed.  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 No No No Yes Yes  Number falls in past yr: 1 - - 1 2 or more  Comment He tripped.  - - - -  Injury with Fall? No - - No -  Follow up Falls prevention discussed;Education provided - - - -    Depression Screen PHQ 2/9 Scores 03/19/2018 01/14/2018 03/11/2017 01/01/2016  PHQ - 2 Score 0 0 0 0    Cognitive Function     6CIT Screen 03/19/2018  What Year? 0 points  What month? 0 points  What time? 0 points  Count back from 20 0 points  Months in reverse 0 points  Repeat phrase 0 points  Total Score 0    Immunization History  Administered Date(s) Administered  . Influenza Split 05/21/2012  . Influenza,inj,Quad PF,6+ Mos 03/16/2014, 06/20/2015, 04/27/2016  . Pneumococcal Conjugate-13 07/05/2015  . Pneumococcal Polysaccharide-23 01/07/2011  . Tdap 01/14/2018   Screening Tests Health Maintenance  Topic Date Due  . INFLUENZA VACCINE  02/18/2018  . Hepatitis C Screening  03/20/2019 (Originally [birth year])  . COLONOSCOPY  07/02/2022  . TETANUS/TDAP  01/15/2028  . PNA vac Low Risk Adult  Completed      Plan:    End of life planning; Advance aging; Advanced directives discussed. Copy of current HCPOA/Living Will requested.    I have personally reviewed and noted the following in the patient's chart:   . Medical and social history . Use of alcohol, tobacco or illicit drugs  . Current medications and supplements . Functional ability  and status . Nutritional status . Physical activity . Advanced directives . List of other physicians . Hospitalizations, surgeries, and ER visits in previous 12 months . Vitals . Screenings to include cognitive, depression, and falls . Referrals and appointments  In addition, I have reviewed and discussed with patient certain preventive protocols, quality metrics, and best practice recommendations. A written personalized care plan for preventive services as well as general preventive health recommendations were provided to patient.     Varney Biles, LPN  01/01/8306

## 2018-03-19 NOTE — Patient Instructions (Addendum)
  Mr. Kuang , Thank you for taking time to come for your Medicare Wellness Visit. I appreciate your ongoing commitment to your health goals. Please review the following plan we discussed and let me know if I can assist you in the future.   Follow up as needed.    Bring a copy of your [place] and/or Living Will to be scanned into chart.  Have a great day!  These are the goals we discussed: Goals    . 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 Flu Shot  02/18/2018  .  Hepatitis C: One time screening is recommended by Center for Disease Control  (CDC) for  adults born from [birth year] through 1966.   03/20/2019*  . Colon Cancer Screening  07/02/2022  . Tetanus Vaccine  01/15/2028  . Pneumonia vaccines  Completed  *Topic was postponed. The date shown is not the original due date.

## 2018-03-20 NOTE — Progress Notes (Signed)
I have reviewed the above note and agree.  Jaquaveon Bilal, M.D.  

## 2018-03-23 ENCOUNTER — Inpatient Hospital Stay (HOSPITAL_BASED_OUTPATIENT_CLINIC_OR_DEPARTMENT_OTHER): Payer: Medicare HMO | Admitting: Nurse Practitioner

## 2018-03-23 ENCOUNTER — Inpatient Hospital Stay: Payer: Medicare HMO | Attending: Nurse Practitioner

## 2018-03-23 ENCOUNTER — Other Ambulatory Visit: Payer: Self-pay | Admitting: Nurse Practitioner

## 2018-03-23 ENCOUNTER — Encounter: Payer: Self-pay | Admitting: Nurse Practitioner

## 2018-03-23 ENCOUNTER — Telehealth: Payer: Self-pay | Admitting: *Deleted

## 2018-03-23 ENCOUNTER — Other Ambulatory Visit: Payer: Self-pay | Admitting: *Deleted

## 2018-03-23 VITALS — BP 140/75 | HR 65 | Temp 97.7°F | Resp 18 | Wt 208.0 lb

## 2018-03-23 DIAGNOSIS — R310 Gross hematuria: Secondary | ICD-10-CM | POA: Diagnosis not present

## 2018-03-23 DIAGNOSIS — C9110 Chronic lymphocytic leukemia of B-cell type not having achieved remission: Secondary | ICD-10-CM

## 2018-03-23 DIAGNOSIS — I10 Essential (primary) hypertension: Secondary | ICD-10-CM | POA: Diagnosis not present

## 2018-03-23 DIAGNOSIS — I480 Paroxysmal atrial fibrillation: Secondary | ICD-10-CM

## 2018-03-23 DIAGNOSIS — Z7901 Long term (current) use of anticoagulants: Secondary | ICD-10-CM | POA: Diagnosis not present

## 2018-03-23 DIAGNOSIS — R319 Hematuria, unspecified: Secondary | ICD-10-CM

## 2018-03-23 DIAGNOSIS — Z87891 Personal history of nicotine dependence: Secondary | ICD-10-CM | POA: Diagnosis not present

## 2018-03-23 DIAGNOSIS — R5383 Other fatigue: Secondary | ICD-10-CM | POA: Insufficient documentation

## 2018-03-23 LAB — URINALYSIS, COMPLETE (UACMP) WITH MICROSCOPIC
Bilirubin Urine: NEGATIVE
Glucose, UA: NEGATIVE mg/dL
Ketones, ur: NEGATIVE mg/dL
Leukocytes, UA: NEGATIVE
Nitrite: NEGATIVE
Protein, ur: 30 mg/dL — AB
RBC / HPF: >50 RBC/hpf — ABNORMAL HIGH (ref 0–5)
SPECIFIC GRAVITY, URINE: 1.01 (ref 1.005–1.030)
SQUAMOUS EPITHELIAL / LPF: NONE SEEN (ref 0–5)
pH: 6 (ref 5.0–8.0)

## 2018-03-23 LAB — CBC WITH DIFFERENTIAL/PLATELET
Basophils Absolute: 0.2 10*3/uL — ABNORMAL HIGH (ref 0–0.1)
Basophils Relative: 0 %
EOS ABS: 0.2 10*3/uL (ref 0–0.7)
Eosinophils Relative: 0 %
HEMATOCRIT: 35.3 % — AB (ref 40.0–52.0)
HEMOGLOBIN: 10.9 g/dL — AB (ref 13.0–18.0)
LYMPHS ABS: 125.3 10*3/uL — AB (ref 1.0–3.6)
Lymphocytes Relative: 94 %
MCH: 31.6 pg (ref 26.0–34.0)
MCHC: 30.8 g/dL — ABNORMAL LOW (ref 32.0–36.0)
MCV: 102.3 fL — ABNORMAL HIGH (ref 80.0–100.0)
Monocytes Absolute: 2 10*3/uL — ABNORMAL HIGH (ref 0.2–1.0)
Monocytes Relative: 2 %
NEUTROS ABS: 4.8 10*3/uL (ref 1.4–6.5)
NEUTROS PCT: 4 %
PLATELETS: 164 10*3/uL (ref 150–440)
RBC: 3.45 MIL/uL — AB (ref 4.40–5.90)
RDW: 16.7 % — AB (ref 11.5–14.5)
WBC: 132.5 10*3/uL — AB (ref 3.8–10.6)

## 2018-03-23 LAB — COMPREHENSIVE METABOLIC PANEL
ALT: 27 U/L (ref 0–44)
ANION GAP: 8 (ref 5–15)
AST: 23 U/L (ref 15–41)
Albumin: 3.8 g/dL (ref 3.5–5.0)
Alkaline Phosphatase: 82 U/L (ref 38–126)
BUN: 16 mg/dL (ref 8–23)
CHLORIDE: 106 mmol/L (ref 98–111)
CO2: 25 mmol/L (ref 22–32)
CREATININE: 0.93 mg/dL (ref 0.61–1.24)
Calcium: 8.4 mg/dL — ABNORMAL LOW (ref 8.9–10.3)
Glucose, Bld: 110 mg/dL — ABNORMAL HIGH (ref 70–99)
POTASSIUM: 4.3 mmol/L (ref 3.5–5.1)
SODIUM: 139 mmol/L (ref 135–145)
Total Bilirubin: 1.2 mg/dL (ref 0.3–1.2)
Total Protein: 6.7 g/dL (ref 6.5–8.1)

## 2018-03-23 LAB — APTT: APTT: 33 s (ref 24–36)

## 2018-03-23 LAB — PROTIME-INR
INR: 1.33
PROTHROMBIN TIME: 16.4 s — AB (ref 11.4–15.2)

## 2018-03-23 NOTE — Progress Notes (Signed)
[facility]  [phone] [phone]  Patient Care Team: Leone Haven, MD as PCP - General (Family Medicine) [name], MD as Consulting Physician (Cardiology) Cammie Sickle, MD as Medical Oncologist (Hematology and Oncology)   Name of the patient: [name]  [id number]  May 13, 1946   Date of visit: 03/23/18  Diagnosis- CLL  Chief complaint/ Reason for visit- Hematuria  Heme/Onc history:  Oncology History   # AUG 2015- SLL/CLL [Right Ax Ln Bx] s/p Benda-Rituxan x6 [finished March 2016]; Maintenance Rituxan q 41m[started April 2016; Dr.Pandit];Last Ritux Jan 2017.  MARCH 2017- CT N/C/A/P- NED. STOP Ritux; surveillance   # AUG 2019- CT/PET- progression/NO transformation   # s/p PPM [Dr.Klein; Sep 2017]; A.fib [on eliquis]  # MAY 2019- 65% OF NUCLEI POSITIVE FOR ATM DELETION; 53% OF NUCLEI POSITIVE FOR TP53 DELETION; IGVH- UN-MUTATED [poor prognosis]  DIAGNOSIS: CLL  STAGE: IV         ;GOALS: control  CURRENT/MOST RECENT THERAPY : Ibrutinib [ aug 21st]      CLL (chronic lymphocytic leukemia) ([place]    Interval history- [name] 72year old male, with above history of CLL, [facility] for complaints of hematuria which he describes as bright red bloody urine.  No clots.  He states he had 2 episodes last night and one episode of slightly less bloody urine this morning.  No history of kidney stones.  Denies trauma.  States he did fall through a floor while making home repairs over the weekend he does not believe he struck his groin or abdomen.  He has not sought evaluation for hematuria previously.  He started Imbruvica on 03/05/2018.  He continues to feel fatigued.  He denies altered urine stream, hesitancy, intermittency, nocturia, urgency, sense of residual urine, incontinence, dysuria.  One episode of nausea and diarrhea over the weekend but denies abnormal  bleeding, melena, hematochezia.  He has easy bruising chronically and feels it is no worse.  Denies any fever or chills.  He is on Eliquis chronically for atrial fibrillation and is managed by Dr. GRockey Situand Dr. KCaryl Comes  Chart review reveals that he was last seen by Dr. KCaryl Comeson 02/09/2018.  He was seen 02/2016 with significant sinus bradycardia and underwent pacing he has prior history of coronary artery disease with prior bypass surgery.  He is on Eliquis and amiodarone for chronic A. Fib.   He is accompanied today by his wife he reports that she is concerned that she will be out of town.   ECOG FS:1 - Symptomatic but completely ambulatory  Review of systems- Review of Systems  Constitutional: Positive for malaise/fatigue. Negative for chills, fever and weight loss.  HENT: Negative for congestion, ear discharge, ear pain, sinus pain, sore throat and tinnitus.   Eyes: Negative.   Respiratory: Positive for shortness of breath (with exertion; chronic; no worse). Negative for cough and sputum production.   Cardiovascular: Negative for chest pain, palpitations, orthopnea, claudication and leg swelling.  Gastrointestinal: Positive for diarrhea (1 episode 3-4 days ago; resolved spontaneously) and nausea (1 episode 3-4 days ago; resolved spontaneously). Negative for abdominal pain, blood in stool, constipation, heartburn, melena and vomiting.  Genitourinary: Positive for hematuria. Negative for dysuria, flank pain, frequency and urgency.  Musculoskeletal: Negative.   Skin: Negative.   Neurological: Negative for dizziness, tingling, weakness and headaches.  Endo/Heme/Allergies: Negative.   Psychiatric/Behavioral: Negative.      Current treatment- Imbruvica 420  mg daily  No Known Allergies  Past Medical History:  Diagnosis Date  . Anxiety   . Arthritis   . Atrial fibrillation ([place])    a. Dx 2013, recurred 02/2014, CHA2DS2VASc = 3 -->placed on Eliquis;  b. 02/2014 Echo: EF 50-55%, mid and apical  anterior septum and mid and apical inf septum are abnl, mild to mod Ao sclerosis w/o AS.  Marland Kitchen Chicken pox   . Chronic lymphocytic leukemia ([place])    a. Dx 02/2014.  Marland Kitchen Complication of anesthesia    History of  PTSD--do not touch patient when waking up from surgery.  Marland Kitchen COPD (chronic obstructive pulmonary disease) ([place])   . Coronary artery disease    a. 04/2009 CABG x 3 (LIMA->LAD, VG->OM1, VG->PDA);  b. 09/2009 Cath: occluded VG x 2 w/ patent LIMA and L->R collats. EF 55%, mild antlat HK;  c. 10/2011 MV: EF 53%, no isch/infarct-->low risk.  Marland Kitchen Dysrhythmia    hx of a-fib  . GERD (gastroesophageal reflux disease)    occasional  . History of chemotherapy 2015-2016  . HOH (hard of hearing)    Bilateral Hearing Aids  . Hypertension   . Myocardial infarction ([place]) 2010  . OSA on CPAP    USE C-PAP  . Presence of permanent cardiac pacemaker 2017  . PTSD (post-traumatic stress disorder)   . PTSD (post-traumatic stress disorder)   . Pure hypercholesterolemia   . Rheumatic fever 1959    Past Surgical History:  Procedure Laterality Date  . ABDOMINAL HERNIA REPAIR    . APPENDECTOMY  06/21/1985  . CARDIAC CATHETERIZATION  2010; 2011   ; Dr Fletcher Anon  . CORONARY ARTERY BYPASS GRAFT  04/2009   "CABG X3"  . EP IMPLANTABLE DEVICE N/A 03/03/2016   Procedure: Pacemaker Implant;  Surgeon: Deboraha Sprang, MD;  Location: [facility];  Service: Cardiovascular;  Laterality: N/A;  . FOREIGN BODY REMOVAL  1968   "shrapnel in my tailbone"  . INGUINAL HERNIA REPAIR Right   . INSERT / REPLACE / REMOVE PACEMAKER    . JOINT REPLACEMENT Right 2018  . LAPAROSCOPIC CHOLECYSTECTOMY    . TONSILLECTOMY AND ADENOIDECTOMY  1956  . TOTAL HIP ARTHROPLASTY Right 10/22/2016   Procedure: TOTAL HIP ARTHROPLASTY;  Surgeon: Dereck Leep, MD;  Location: [facility];  Service: Orthopedics;  Laterality: Right;  . TOTAL HIP ARTHROPLASTY Left 11/04/2017   Procedure: TOTAL HIP ARTHROPLASTY;  Surgeon: Dereck Leep, MD;  Location:  [facility];  Service: Orthopedics;  Laterality: Left;    Social History   Socioeconomic History  . Marital status: Married    Spouse name: Not on file  . Number of children: Not on file  . Years of education: Not on file  . Highest education level: Not on file  Occupational History  . Occupation: retired    [employer] farm manager: Transport planner    Comment: [employer]  . Financial resource strain: Not hard at all  . Food insecurity:    Worry: Never true    Inability: Never true  . Transportation needs:    Medical: No    Non-medical: No  Tobacco Use  . Smoking status: Former Smoker    Packs/day: 1.00    Years: 40.00    Pack years: 40.00    Types: Cigarettes    Last attempt to quit: 07/21/2006    Years since quitting: 11.6  . Smokeless tobacco: Never Used  Substance and Sexual Activity  . Alcohol use: Yes  Alcohol/week: 1.0 standard drinks    Types: 1 Standard drinks or equivalent per week    Comment: rarely  . Drug use: No  . Sexual activity: Not Currently  Lifestyle  . Physical activity:    Days per week: 7 days    Minutes per session: 30 min  . Stress: Not on file  Relationships  . Social connections:    Talks on phone: Not on file    Gets together: Not on file    Attends religious service: Not on file    Active member of club or organization: Not on file    Attends meetings of clubs or organizations: Not on file    Relationship status: Not on file  . Intimate partner violence:    Fear of current or ex partner: No    Emotionally abused: No    Physically abused: No    Forced sexual activity: No  Other Topics Concern  . Not on file  Social History Narrative   Lives in [place] with wife. Dogs. Goats. Children - 4. Grandchildren - 7.      Worked - Sports coach, part time.   Diet - healthy   Exercise - very active      Service - [employer] - [place]    Family History  Problem Relation Age of Onset  . Heart disease Mother   . Heart  attack Mother   . Coronary artery disease Unknown        family history    Current Outpatient Medications:  .  acetaminophen (TYLENOL) 500 MG tablet, Take 1,000 mg by mouth every 8 (eight) hours as needed for mild pain., Disp: , Rfl:  .  albuterol (PROVENTIL HFA;VENTOLIN HFA) 108 (90 Base) MCG/ACT inhaler, Inhale 2 puffs into the lungs every 6 (six) hours as needed for wheezing or shortness of breath., Disp: 1 Inhaler, Rfl: 11 .  amiodarone (PACERONE) 200 MG tablet, Take 0.5 tablets (100 mg total) by mouth 2 (two) times daily., Disp: 30 tablet, Rfl: 3 .  apixaban (ELIQUIS) 5 MG TABS tablet, Take 1 tablet (5 mg total) by mouth 2 (two) times daily., Disp: 180 tablet, Rfl: 3 .  atorvastatin (LIPITOR) 80 MG tablet, Take 1 tablet (80 mg total) by mouth at bedtime., Disp: 90 tablet, Rfl: 3 .  budesonide-formoterol (SYMBICORT) 160-4.5 MCG/ACT inhaler, Inhale 2 puffs into the lungs 2 (two) times daily., Disp: 1 Inhaler, Rfl: 11 .  cetirizine (ZYRTEC) 10 MG tablet, Take 10 mg by mouth daily as needed for allergies. , Disp: , Rfl:  .  Coenzyme Q10 (COQ10) 200 MG CAPS, Take 200 mg by mouth daily., Disp: , Rfl:  .  ezetimibe (ZETIA) 10 MG tablet, Take 1 tablet (10 mg total) by mouth daily., Disp: 90 tablet, Rfl: 3 .  Ibrutinib 420 MG TABS, Take 420 mg by mouth daily., Disp: 28 tablet, Rfl: 3 .  Krill Oil 350 MG CAPS, Take 350 mg by mouth every evening., Disp: , Rfl:  .  metoprolol tartrate (LOPRESSOR) 25 MG tablet, Take 25 mg by mouth 2 (two) times daily., Disp: , Rfl:  .  mirtazapine (REMERON) 15 MG tablet, Take 15 mg by mouth at bedtime as needed (for panic associated with PTSD). , Disp: , Rfl:  .  Multiple Vitamin (MULTIVITAMIN WITH MINERALS) TABS tablet, Take 1 tablet by mouth daily., Disp: , Rfl:  .  mupirocin ointment (BACTROBAN) 2 %, Apply 1 application topically 3 (three) times daily., Disp: 30 g, Rfl: 1 .  nitroGLYCERIN (  NITROSTAT) 0.4 MG SL tablet, Place 1 tablet (0.4 mg total) under the tongue  every 5 (five) minutes as needed for chest pain., Disp: 25 tablet, Rfl: 6 .  tiotropium (SPIRIVA) 18 MCG inhalation capsule, Place 1 capsule (18 mcg total) into inhaler and inhale at bedtime., Disp: 30 capsule, Rfl: 11 .  traMADol (ULTRAM) 50 MG tablet, TAKE 1 TABLET BY MOUTH EVERY 12 HOURS AS NEEDED FOR MODERATE PAIN, Disp: 90 tablet, Rfl: 0  Physical exam:  Vitals:   03/23/18 1027  BP: 140/75  Pulse: 65  Resp: 18  Temp: 97.7 F (36.5 C)  TempSrc: Oral  SpO2: 98%  Weight: 208 lb (94.3 kg)    GENERAL: Well-nourished well-developed; Alert, no distress and comfortable. Accompanied.  EYES: no pallor or icterus OROPHARYNX: no thrush or ulceration NECK: Positive for cervical adenopathy L > R.  LUNGS: Clear to auscultation bilaterally. No wheeze or crackles HEART/CVS: regular rate & rhythm and no murmurs; No lower extremity edema ABDOMEN: abdomen soft, non-tender and normal bowel sounds.  Musculoskeletal: no cyanosis of digits and no clubbing  PSYCH: alert & oriented x 3 with fluent speech NEURO: no focal motor/sensory deficits SKIN: no rashes or significant lesions.  Mild bruising on right and left forearms.    CMP Latest Ref Rng & Units 03/23/2018  Glucose 70 - 99 mg/dL 110(H)  BUN 8 - 23 mg/dL 16  Creatinine 0.61 - 1.24 mg/dL 0.93  Sodium 135 - 145 mmol/L 139  Potassium 3.5 - 5.1 mmol/L 4.3  Chloride 98 - 111 mmol/L 106  CO2 22 - 32 mmol/L 25  Calcium 8.9 - 10.3 mg/dL 8.4(L)  Total Protein 6.5 - 8.1 g/dL 6.7  Total Bilirubin 0.3 - 1.2 mg/dL 1.2  Alkaline Phos 38 - 126 U/L 82  AST 15 - 41 U/L 23  ALT 0 - 44 U/L 27   CBC Latest Ref Rng & Units 03/23/2018  WBC 3.8 - 10.6 K/uL 132.5(HH)  Hemoglobin 13.0 - 18.0 g/dL 10.9(L)  Hematocrit 40.0 - 52.0 % 35.3(L)  Platelets 150 - 440 K/uL 164    No images are attached to the encounter.  Ct Soft Tissue Neck W Contrast  Result Date: 02/25/2018 CLINICAL DATA:  CLL/SLL.  Increasing white count. EXAM: CT NECK WITH CONTRAST TECHNIQUE:  Multidetector CT imaging of the neck was performed using the standard protocol following the bolus administration of intravenous contrast. CONTRAST:  164m OMNIPAQUE IOHEXOL 300 MG/ML  SOLN COMPARISON:  11/02/2015.  09/28/2015. FINDINGS: Pharynx and larynx: No mucosal or submucosal mass. Salivary glands: Parotid and submandibular glands are normal. Thyroid: No significant thyroid finding. Lymph nodes: Lymphadenopathy throughout the neck at all nodal stations. Index level 2 node on the right image 50 measures 14 x 23 mm. Index level 2 node on the left image 53 measures 18 x 23 mm. Index level 3 node on the left image 80 measures 8 x 16 mm. Index level 5 node on the right image 69 measures 18 x 23 mm. Index level 5 node on the left image 69 measures 20 x 21 mm. Vascular: Carotid bifurcation atherosclerotic calcification. Limited intracranial: Normal Visualized orbits: Normal Mastoids and visualized paranasal sinuses: Chronic inflammatory changes of the sphenoid sinus. Skeleton: chronic cervical spondylosis with prominent anterior and posterior osteophytes. Upper chest: See results of chest CT. Other: None IMPRESSION: Bilateral lymphadenopathy throughout the neck affecting all nodal stations. Index nodes measured as above. Electronically Signed   By: MNelson ChimesM.D.   On: 02/25/2018 15:42   Ct  Chest W Contrast  Result Date: 02/25/2018 CLINICAL DATA:  Follow-up CLL EXAM: CT CHEST, ABDOMEN, AND PELVIS WITH CONTRAST TECHNIQUE: Multidetector CT imaging of the chest, abdomen and pelvis was performed following the standard protocol during bolus administration of intravenous contrast. CONTRAST:  142m OMNIPAQUE IOHEXOL 300 MG/ML  SOLN COMPARISON:  09/28/2015 FINDINGS: CT CHEST FINDINGS Cardiovascular: The heart size is normal. There is been previous median sternotomy and CABG procedure. Aortic atherosclerosis noted. Mediastinum/Nodes: The trachea appears patent and is midline. Normal appearance of the thyroid gland.  Normal appearance of the esophagus. Extensive thoracic adenopathy is identified compatible with recurrence of disease. Index left supraclavicular lymph node measures 2.3 cm, image 4/5. Index left axillary lymph node measures 2.2 cm, image 18/5. Index right axillary node measures 2.8 cm, image 14/5. Right supraclavicular lymph node measures 1.9 cm, image 5/5. Index right paratracheal lymph node measures 1.6 cm, image 24/5. Index left AP window lymph node measures 1.3 cm, image 24/5. Lungs/Pleura: No pleural effusion identified. No airspace consolidation, atelectasis or pneumothorax identified. Chronic scarring and mild bronchiectasis within the lingula noted. Musculoskeletal: Spondylosis within the thoracic spine noted. No suspicious bone lesions identified. CT ABDOMEN PELVIS FINDINGS Hepatobiliary: No focal liver abnormality is seen. Status post cholecystectomy. No biliary dilatation. Pancreas: Unremarkable. No pancreatic ductal dilatation or surrounding inflammatory changes. Spleen: The spleen measures 13 by 11.7 by 5.8 cm (volume = 460 cm^3). On the previous exam the spleen measured 10.3 by 9.5 x 4.5 cm (volume = 230 cm^3). Adrenals/Urinary Tract: Normal appearance of the adrenal glands. No kidney mass or hydronephrosis. Urinary bladder is unremarkable. Stomach/Bowel: Stomach is within normal limits. Appendix appears normal. No evidence of bowel wall thickening, distention, or inflammatory changes. Vascular/Lymphatic: Aortic atherosclerosis without aneurysm. The infrarenal abdominal aorta measures 2.4 cm in maximum AP dimension. Extensive bulky adenopathy within the abdomen and pelvis identified. Large nodal mass involving the retroperitoneum and encasing and up lifting the aorta and IVC measures 13.1 by 7.2 by 16.5 cm. There is extensive bilateral iliac adenopathy. Large right pelvic sidewall node measures 3.5 by 6.5 cm. Enlarged bilateral inguinal lymph nodes identified. Index right inguinal lymph node measures  3.6 by 1.6 cm, image 114/5. Reproductive: Prostate is unremarkable. Other: None Musculoskeletal: Previous bilateral hip arthroplasty. Multi level spondylosis identified IMPRESSION: 1. Examination is positive for recurrent bulky adenopathy within the chest, abdomen and pelvis compatible with recurrent lymphoma 2. Interval development of splenomegaly. 3.  Aortic Atherosclerosis (ICD10-I70.0). Electronically Signed   By: TKerby MoorsM.D.   On: 02/25/2018 16:58   Ct Abdomen Pelvis W Contrast  Result Date: 02/25/2018 CLINICAL DATA:  Follow-up CLL EXAM: CT CHEST, ABDOMEN, AND PELVIS WITH CONTRAST TECHNIQUE: Multidetector CT imaging of the chest, abdomen and pelvis was performed following the standard protocol during bolus administration of intravenous contrast. CONTRAST:  1028mOMNIPAQUE IOHEXOL 300 MG/ML  SOLN COMPARISON:  09/28/2015 FINDINGS: CT CHEST FINDINGS Cardiovascular: The heart size is normal. There is been previous median sternotomy and CABG procedure. Aortic atherosclerosis noted. Mediastinum/Nodes: The trachea appears patent and is midline. Normal appearance of the thyroid gland. Normal appearance of the esophagus. Extensive thoracic adenopathy is identified compatible with recurrence of disease. Index left supraclavicular lymph node measures 2.3 cm, image 4/5. Index left axillary lymph node measures 2.2 cm, image 18/5. Index right axillary node measures 2.8 cm, image 14/5. Right supraclavicular lymph node measures 1.9 cm, image 5/5. Index right paratracheal lymph node measures 1.6 cm, image 24/5. Index left AP window lymph node measures 1.3 cm, image 24/5.  Lungs/Pleura: No pleural effusion identified. No airspace consolidation, atelectasis or pneumothorax identified. Chronic scarring and mild bronchiectasis within the lingula noted. Musculoskeletal: Spondylosis within the thoracic spine noted. No suspicious bone lesions identified. CT ABDOMEN PELVIS FINDINGS Hepatobiliary: No focal liver abnormality is  seen. Status post cholecystectomy. No biliary dilatation. Pancreas: Unremarkable. No pancreatic ductal dilatation or surrounding inflammatory changes. Spleen: The spleen measures 13 by 11.7 by 5.8 cm (volume = 460 cm^3). On the previous exam the spleen measured 10.3 by 9.5 x 4.5 cm (volume = 230 cm^3). Adrenals/Urinary Tract: Normal appearance of the adrenal glands. No kidney mass or hydronephrosis. Urinary bladder is unremarkable. Stomach/Bowel: Stomach is within normal limits. Appendix appears normal. No evidence of bowel wall thickening, distention, or inflammatory changes. Vascular/Lymphatic: Aortic atherosclerosis without aneurysm. The infrarenal abdominal aorta measures 2.4 cm in maximum AP dimension. Extensive bulky adenopathy within the abdomen and pelvis identified. Large nodal mass involving the retroperitoneum and encasing and up lifting the aorta and IVC measures 13.1 by 7.2 by 16.5 cm. There is extensive bilateral iliac adenopathy. Large right pelvic sidewall node measures 3.5 by 6.5 cm. Enlarged bilateral inguinal lymph nodes identified. Index right inguinal lymph node measures 3.6 by 1.6 cm, image 114/5. Reproductive: Prostate is unremarkable. Other: None Musculoskeletal: Previous bilateral hip arthroplasty. Multi level spondylosis identified IMPRESSION: 1. Examination is positive for recurrent bulky adenopathy within the chest, abdomen and pelvis compatible with recurrent lymphoma 2. Interval development of splenomegaly. 3.  Aortic Atherosclerosis (ICD10-I70.0). Electronically Signed   By: Kerby Moors M.D.   On: 02/25/2018 16:58   Nm Pet Image Initial (pi) Skull Base To Thigh  Result Date: 03/09/2018 CLINICAL DATA:  Subsequent treatment strategy for chronic lymphocytic leukemia. EXAM: NUCLEAR MEDICINE PET SKULL BASE TO THIGH TECHNIQUE: 10.95 mCi F-18 FDG was injected intravenously. Full-ring PET imaging was performed from the skull base to thigh after the radiotracer. CT data was obtained and  used for attenuation correction and anatomic localization. Fasting blood glucose: 104 mg/dl COMPARISON:  07/18/2014 FINDINGS: Mediastinal blood pool activity: SUV max 2.37 Liver activity: SUV max 3.06 NECK: Bilateral cervical adenopathy is identified. Index left level 2 node measures 1.7 cm with SUV max of 3.4. Index right level 2 node measures 1.4 cm with SUV max of 3.4. Incidental CT findings: none CHEST: Extensive bilateral supraclavicular and axillary adenopathy. Index right axillary lymph node measures 2.6 cm and has an SUV max of 3.35. Index left axillary node measures 2.3 cm and has an SUV max of 3.4. Index left supraclavicular lymph node measures 2.2 cm with SUV max of 2.78. Right supraclavicular lymph node measures 1.5 cm with SUV max of 2.21. Mildly hypermetabolic bilateral mediastinal and hilar adenopathy. Index left hilar node measures 1.7 cm with SUV max of 2.89. Index subcarinal lymph node measures 1.4 cm with SUV max of 2.5. No hypermetabolic pulmonary nodules or masses identified. Incidental CT findings: Aortic atherosclerosis. Calcifications in the left main coronary artery, LAD, left circumflex and RCA noted. ABDOMEN/PELVIS: No abnormal radiotracer activity identified within the liver. No abnormal uptake within the pancreas. The spleen measures 12.1 by 6.5 by 13.1 cm (volume = 540 cm^3) No focal areas of splenic hypermetabolism identified. Unremarkable appearance of the adrenal glands. Extensive abdominal adenopathy is identified centered around the retroperitoneum and encasing the IVC and aorta. The tumor mass measures 13.1 x 9.2 by 22 cm. SUV max is equal to 5.24. Bulky bilateral pelvic sidewall adenopathy is also present. Index right pelvic sidewall node measures 3.4 x 6.7 cm with=  SUV max of 3.78. The left pelvic sidewall lymph node measures 2.6 x 7.6 cm with SUV max of 3.4. Incidental CT findings: Previous bilateral inguinal herniorrhaphy. Aortic atherosclerosis is identified. Previous  cholecystectomy. SKELETON: No focal hypermetabolic activity to suggest skeletal metastasis. Incidental CT findings: Status post bilateral hip arthroplasty. IMPRESSION: 1. Examination is positive for extensive adenopathy within the neck, chest, abdomen and pelvis. The tumor within the abdomen has an SUV max of 5.24 compatible with Deauville criteria 4. 2. Splenomegaly. 3.  Aortic Atherosclerosis (ICD10-I70.0). 4. Multi vessel coronary artery calcifications including left main disease. Electronically Signed   By: Kerby Moors M.D.   On: 03/09/2018 16:56    Assessment and plan- Patient is a 72 y.o. male diagnosed with CLL who presents to symptom management clinic for hematuria.  1.  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  02/2018 CT PET shows bilateral axilla adenopathy, mediastinal adenopathy, bulky abdominal/retroperitoneal adenopathy, splenomegaly.  No evidence of transformation.  Suspect mild nausea and fatigue related to ibrutinib and disease. WBC 132.5, Lymph- 125.3. Increased. Discussed with Dr. Rogue Bussing who advises this is likely r/t recently starting Imbruvica.   2.  Hematuria-grade II (macroscopic hematuria w/o clots). No flank pain or CVA tenderness.  UA positive for blood and protein; no evidence of infection.  Serum albumin, serum creatinine, bun- stable and wnl. GFR > 60. CT and PET on 02/25/2018 independently reviewed.  No masses on kidneys or bladder.  Bulky adenopathy present.  Hemoglobin 10.9 today, platelets 164.  Vital signs stable.  Currently on Eliquis. We discussed that side effects of Imbruvica can involve abnormal bleeding including hematuria.   Per Up-To-Date, recommendation for grade 3 hematologic toxicity is interruption of therapy until improvement to grade 1 or baseline then resume dosing at starting dose.  If toxicity recurs, dose reduction.  Per manufacturer, in clinical trials 3.1% of patients taking Imbruvica without antiplatelet or  anticoagulants experienced grade 3 or greater hematologic toxicities and this increased to 6.1% when taken with anticoagulant.  Imbruvica plus antiplatelet therapy has 4.4% risk.  Findings discussed with Dr. Rogue Bussing who recommends holding Eliquis and Imbruvica x 1 week. If symptoms resolve, restarting both medications. Will have him return to clinic on 04/01/18 for labs (cbc, cmet, ua) and re-evaluation in [facility] on 04/01/18. Patient provided with copy of his lab results from today's visit and mychart activated per his request.   3.  Paroxysmal A. fib-on Eliquis and amiodarone per Dr. Rockey Situ and Dr. Caryl Comes. Patient and wife expressed concern of holding anticoagulation. Normal sinus rhythm today and no recent reports of palpitations, lightheadedness, syncope, or chest pain.  There is increased risk of atrial fibrillation and a flutter with Imbruvica and so I reached out to cardiology to discuss.  Per Dr. Caryl Comes, recommendation to hold Eliquis and no need to start antiplatelet in interim.  4.  Hypertension-blood pressure today improved. In setting of Imbruvica, continue to monitor.   Hold Eliquis & Imbruvica. If symptoms resolve, can restart on 9/9 and f/u with labs and re-evaluation in [facility] on 04/01/18. If symptoms persist or worsen, please notify clinic.    Visit Diagnosis 1. Gross hematuria   2. CLL (chronic lymphocytic leukemia) ([place])     Patient expressed understanding and was in agreement with this plan. He also understands that He can call clinic at any time with any questions, concerns, or complaints.   Thank you for allowing me to participate in the care of this very pleasant patient.  Beckey Rutter, DNP, AGNP-C [place] at [facility] [phone] (work cell) [phone] (office)

## 2018-03-23 NOTE — Telephone Encounter (Signed)
Reviewed the below message with Dr. Caryl Comes. Orders received that the patient may hold his eliquis for now and resume at the discretion of the oncology team. He does not need ASA in the interim.  I called and spoke with Ander Purpura, NP and advised her of the above MD recommendations.

## 2018-03-23 NOTE — Telephone Encounter (Signed)
Patient called to report that his urine stream last night and this morning was pure red blood. Second stream this morning was pink tinged. Appointment given for this morning to come in for lab, see NP in [facility]om Management Clinic and accepted.

## 2018-03-23 NOTE — Telephone Encounter (Signed)
Lauren, NP from the cancer center called to see if the patient could hold Eliquis due to hematuria. She would like to know if the patient can hold it if he would need to be on aspirin in the meantime. The patient is currently on Eliquis for atrial fibrillation.

## 2018-03-24 DIAGNOSIS — G4733 Obstructive sleep apnea (adult) (pediatric): Secondary | ICD-10-CM | POA: Diagnosis not present

## 2018-03-25 ENCOUNTER — Inpatient Hospital Stay: Payer: Medicare HMO

## 2018-04-01 ENCOUNTER — Inpatient Hospital Stay: Payer: Medicare HMO

## 2018-04-01 ENCOUNTER — Inpatient Hospital Stay (HOSPITAL_BASED_OUTPATIENT_CLINIC_OR_DEPARTMENT_OTHER): Payer: Medicare HMO | Admitting: Nurse Practitioner

## 2018-04-01 ENCOUNTER — Encounter: Payer: Self-pay | Admitting: Nurse Practitioner

## 2018-04-01 VITALS — BP 154/79 | HR 61 | Temp 98.1°F | Resp 16 | Wt 207.0 lb

## 2018-04-01 DIAGNOSIS — C9110 Chronic lymphocytic leukemia of B-cell type not having achieved remission: Secondary | ICD-10-CM

## 2018-04-01 DIAGNOSIS — I10 Essential (primary) hypertension: Secondary | ICD-10-CM

## 2018-04-01 DIAGNOSIS — I480 Paroxysmal atrial fibrillation: Secondary | ICD-10-CM

## 2018-04-01 DIAGNOSIS — R5382 Chronic fatigue, unspecified: Secondary | ICD-10-CM

## 2018-04-01 DIAGNOSIS — Z87891 Personal history of nicotine dependence: Secondary | ICD-10-CM

## 2018-04-01 DIAGNOSIS — R5383 Other fatigue: Secondary | ICD-10-CM | POA: Diagnosis not present

## 2018-04-01 DIAGNOSIS — R319 Hematuria, unspecified: Secondary | ICD-10-CM | POA: Diagnosis not present

## 2018-04-01 DIAGNOSIS — R310 Gross hematuria: Secondary | ICD-10-CM | POA: Diagnosis not present

## 2018-04-01 DIAGNOSIS — Z7901 Long term (current) use of anticoagulants: Secondary | ICD-10-CM | POA: Diagnosis not present

## 2018-04-01 LAB — COMPREHENSIVE METABOLIC PANEL
ALBUMIN: 4.1 g/dL (ref 3.5–5.0)
ALK PHOS: 91 U/L (ref 38–126)
ALT: 24 U/L (ref 0–44)
AST: 20 U/L (ref 15–41)
Anion gap: 7 (ref 5–15)
BUN: 18 mg/dL (ref 8–23)
CALCIUM: 8.6 mg/dL — AB (ref 8.9–10.3)
CO2: 26 mmol/L (ref 22–32)
CREATININE: 0.92 mg/dL (ref 0.61–1.24)
Chloride: 108 mmol/L (ref 98–111)
GFR calc Af Amer: >60 mL/min (ref >60)
GFR calc non Af Amer: >60 mL/min (ref >60)
GLUCOSE: 116 mg/dL — AB (ref 70–99)
Potassium: 4 mmol/L (ref 3.5–5.1)
SODIUM: 141 mmol/L (ref 135–145)
Total Bilirubin: 1.1 mg/dL (ref 0.3–1.2)
Total Protein: 6.9 g/dL (ref 6.5–8.1)

## 2018-04-01 LAB — CBC WITH DIFFERENTIAL/PLATELET
BASOS PCT: 0 %
Basophils Absolute: 0 10*3/uL (ref 0–0.1)
EOS ABS: 0 10*3/uL (ref 0–0.7)
Eosinophils Relative: 0 %
HCT: 36 % — ABNORMAL LOW (ref 40.0–52.0)
HEMOGLOBIN: 11.1 g/dL — AB (ref 13.0–18.0)
Lymphocytes Relative: 93 %
Lymphs Abs: 111.7 10*3/uL — ABNORMAL HIGH (ref 1.0–3.6)
MCH: 31.4 pg (ref 26.0–34.0)
MCHC: 30.7 g/dL — AB (ref 32.0–36.0)
MCV: 102.3 fL — ABNORMAL HIGH (ref 80.0–100.0)
MONO ABS: 2.4 10*3/uL — AB (ref 0.2–1.0)
Monocytes Relative: 2 %
NEUTROS PCT: 5 %
Neutro Abs: 6 10*3/uL (ref 1.4–6.5)
PLATELETS: 142 10*3/uL — AB (ref 150–440)
RBC: 3.52 MIL/uL — ABNORMAL LOW (ref 4.40–5.90)
RDW: 17.1 % — ABNORMAL HIGH (ref 11.5–14.5)
WBC: 120.1 10*3/uL (ref 4.0–10.5)

## 2018-04-01 LAB — URINALYSIS, COMPLETE (UACMP) WITH MICROSCOPIC
Bacteria, UA: NONE SEEN
Bilirubin Urine: NEGATIVE
GLUCOSE, UA: NEGATIVE mg/dL
Hgb urine dipstick: NEGATIVE
KETONES UR: NEGATIVE mg/dL
Leukocytes, UA: NEGATIVE
Nitrite: NEGATIVE
PROTEIN: NEGATIVE mg/dL
SQUAMOUS EPITHELIAL / LPF: NONE SEEN (ref 0–5)
Specific Gravity, Urine: 1.011 (ref 1.005–1.030)
pH: 7 (ref 5.0–8.0)

## 2018-04-01 NOTE — Progress Notes (Signed)
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  MARCH 2017- CT N/C/A/P- NED. STOP Ritux; surveillance   # AUG 2019- CT/PET- progression/NO transformation   # s/p PPM [Dr.Klein; Sep 2017]; A.fib [on eliquis]  # MAY 2019- 65% OF NUCLEI POSITIVE FOR ATM DELETION; 53% OF NUCLEI POSITIVE FOR TP53 DELETION; IGVH- UN-MUTATED [poor prognosis]  DIAGNOSIS: CLL  STAGE: IV         ;GOALS: control  CURRENT/MOST RECENT THERAPY : Ibrutinib [ aug 21st]      C[place]ch[place] lymphocytic leukemia) [place]    Interval history- [name] 72year old male, with above history of CLL, presents to [facility] Management [facility] for previous episode of hematuria.  He was last seen in symptom management clinic 1 week ago with complaints of bright red bloody urine, no clots.  He had started Imbruvica on 03/05/2018 and is on Eliquis and amiodarone chronically for atrial fibrillation per Dr. KCaryl Comes  UA positive for blood and protein without evidence of infection.  Blood counts stable.  Vitals stable.  Cardiology was consulted who gave okay to hold Eliquis and advised no need to start antiplatelet at that time.  Dr. BRogue Bussingrecommended holding IHollywoodfor 1 week and reevaluating.    Today, patient reports no further episodes of hematuria and he feels well.  He persistently has feelings of fatigue  which is unchanged and chronic.  No urinary complaints.  He expresses concern and holding anticoagulation but denies reports of palpitations, lightheadedness, syncope, or chest pain.  ECOG FS:1 - Symptomatic but completely ambulatory  Review of systems- Review of Systems  Constitutional: Positive for malaise/fatigue (since starting Imbruvica; no worse). Negative for chills, fever and weight loss.  HENT: Negative for congestion, ear discharge, ear pain, sinus pain, sore throat and tinnitus.   Eyes: Negative.   Respiratory: Positive for shortness of breath (with exertion; chronic; no worse). Negative for cough and sputum production.   Cardiovascular: Negative for chest pain, palpitations, orthopnea, claudication and leg swelling.  Gastrointestinal: Positive for nausea (intermittent). Negative for abdominal pain, blood in stool, constipation, diarrhea, heartburn, melena and vomiting.  Genitourinary: Negative for dysuria, flank pain, frequency, hematuria and urgency.  Musculoskeletal: Negative.   Skin: Negative.   Neurological: Negative for dizziness, tingling, weakness and headaches.  Endo/Heme/Allergies: Negative.   Psychiatric/Behavioral: Negative.     Current treatment- Imbruvica 420 mg daily (held since 03/23/18)  No Known Allergies  Past Medical History:  Diagnosis Date  . Anxiety   . Arthritis   . Atrial fibrillation ([place]    a. Dx 2013, recurred 02/2014, CHA2DS2VASc = 3 -->placed on Eliquis;  b. 02/2014 Echo: EF 50-55%, mid and apical anterior septum and mid and apical inf septum are abnl, mild to mod Ao sclerosis w/o AS.  .Marland KitchenChicken pox   . [place] lymphocytic leukemia ([place]    a. Dx 02/2014.  .Marland KitchenComplication of anesthesia    History of  PTSD--do  not touch patient when waking up from surgery.  Marland Kitchen COPD (chronic obstructive pulmonary disease) ([place])   . Coronary artery disease    a. 04/2009 CABG x 3 (LIMA->LAD, VG->OM1, VG->PDA);  b. 09/2009 Cath: occluded VG x 2 w/ patent LIMA and L->R  collats. EF 55%, mild antlat HK;  c. 10/2011 MV: EF 53%, no isch/infarct-->low risk.  Marland Kitchen Dysrhythmia    hx of a-fib  . GERD (gastroesophageal reflux disease)    occasional  . History of chemotherapy 2015-2016  . HOH (hard of hearing)    Bilateral Hearing Aids  . Hypertension   . Myocardial infarction ([place]) 2010  . OSA on CPAP    USE C-PAP  . Presence of permanent cardiac pacemaker 2017  . PTSD (post-traumatic stress disorder)   . PTSD (post-traumatic stress disorder)   . Pure hypercholesterolemia   . Rheumatic fever 1959    Past Surgical History:  Procedure Laterality Date  . ABDOMINAL HERNIA REPAIR    . APPENDECTOMY  06/21/1985  . CARDIAC CATHETERIZATION  2010; 2011   ; Dr Fletcher Anon  . CORONARY ARTERY BYPASS GRAFT  04/2009   "CABG X3"  . EP IMPLANTABLE DEVICE N/A 03/03/2016   Procedure: Pacemaker Implant;  Surgeon: Deboraha Sprang, MD;  Location: [facility];  Service: Cardiovascular;  Laterality: N/A;  . FOREIGN BODY REMOVAL  1968   "shrapnel in my tailbone"  . INGUINAL HERNIA REPAIR Right   . INSERT / REPLACE / REMOVE PACEMAKER    . JOINT REPLACEMENT Right 2018  . LAPAROSCOPIC CHOLECYSTECTOMY    . TONSILLECTOMY AND ADENOIDECTOMY  1956  . TOTAL HIP ARTHROPLASTY Right 10/22/2016   Procedure: TOTAL HIP ARTHROPLASTY;  Surgeon: Dereck Leep, MD;  Location: [facility];  Service: Orthopedics;  Laterality: Right;  . TOTAL HIP ARTHROPLASTY Left 11/04/2017   Procedure: TOTAL HIP ARTHROPLASTY;  Surgeon: Dereck Leep, MD;  Location: [facility];  Service: Orthopedics;  Laterality: Left;    Social History   Socioeconomic History  . Marital status: Married    Spouse name: Not on file  . Number of children: Not on file  . Years of education: Not on file  . Highest education level: Not on file  Occupational History  . Occupation: retired    Fish farm manager: Transport planner    Comment: [place]  . Financial resource strain: Not hard at all  . Food  insecurity:    Worry: Never true    Inability: Never true  . Transportation needs:    Medical: No    Non-medical: No  Tobacco Use  . Smoking status: Former Smoker    Packs/day: 1.00    Years: 40.00    Pack years: 40.00    Types: Cigarettes    Last attempt to quit: 07/21/2006    Years since quitting: 11.7  . Smokeless tobacco: Never Used  Substance and Sexual Activity  . Alcohol use: Yes    Alcohol/week: 1.0 standard drinks    Types: 1 Standard drinks or equivalent per week    Comment: rarely  . Drug use: No  . Sexual activity: Not Currently  Lifestyle  . Physical activity:    Days per week: 7 days    Minutes per session: 30 min  . Stress: Not on file  Relationships  . Social connections:    Talks on phone: Not on file    Gets together: Not on file    Attends religious service: Not on file  Active member of club or organization: Not on file    Attends meetings of clubs or organizations: Not on file    Relationship status: Not on file  . Intimate partner violence:    Fear of current or ex partner: No    Emotionally abused: No    Physically abused: No    Forced sexual activity: No  Other Topics Concern  . Not on file  Social History Narrative   Lives in [place] with wife. Dogs. Goats. Children - 4. Grandchildren - 7.      Worked - Sports coach, part time.   Diet - healthy   Exercise - very active      Service - [employer] - [place]    Family History  Problem Relation Age of Onset  . Heart disease Mother   . Heart attack Mother   . Coronary artery disease Unknown        family history    Current Outpatient Medications:  .  acetaminophen (TYLENOL) 500 MG tablet, Take 1,000 mg by mouth every 8 (eight) hours as needed for mild pain., Disp: , Rfl:  .  albuterol (PROVENTIL HFA;VENTOLIN HFA) 108 (90 Base) MCG/ACT inhaler, Inhale 2 puffs into the lungs every 6 (six) hours as needed for wheezing or shortness of breath., Disp: 1 Inhaler, Rfl: 11 .  amiodarone  (PACERONE) 200 MG tablet, Take 0.5 tablets (100 mg total) by mouth 2 (two) times daily., Disp: 30 tablet, Rfl: 3 .  atorvastatin (LIPITOR) 80 MG tablet, Take 1 tablet (80 mg total) by mouth at bedtime., Disp: 90 tablet, Rfl: 3 .  budesonide-formoterol (SYMBICORT) 160-4.5 MCG/ACT inhaler, Inhale 2 puffs into the lungs 2 (two) times daily., Disp: 1 Inhaler, Rfl: 11 .  cetirizine (ZYRTEC) 10 MG tablet, Take 10 mg by mouth daily as needed for allergies. , Disp: , Rfl:  .  Coenzyme Q10 (COQ10) 200 MG CAPS, Take 200 mg by mouth daily., Disp: , Rfl:  .  ezetimibe (ZETIA) 10 MG tablet, Take 1 tablet (10 mg total) by mouth daily., Disp: 90 tablet, Rfl: 3 .  Ibrutinib 420 MG TABS, Take 420 mg by mouth daily., Disp: 28 tablet, Rfl: 3 .  Krill Oil 350 MG CAPS, Take 350 mg by mouth every evening., Disp: , Rfl:  .  metoprolol tartrate (LOPRESSOR) 25 MG tablet, Take 25 mg by mouth 2 (two) times daily., Disp: , Rfl:  .  mirtazapine (REMERON) 15 MG tablet, Take 15 mg by mouth at bedtime as needed (for panic associated with PTSD). , Disp: , Rfl:  .  Multiple Vitamin (MULTIVITAMIN WITH MINERALS) TABS tablet, Take 1 tablet by mouth daily., Disp: , Rfl:  .  mupirocin ointment (BACTROBAN) 2 %, Apply 1 application topically 3 (three) times daily., Disp: 30 g, Rfl: 1 .  nitroGLYCERIN (NITROSTAT) 0.4 MG SL tablet, Place 1 tablet (0.4 mg total) under the tongue every 5 (five) minutes as needed for chest pain., Disp: 25 tablet, Rfl: 6 .  tiotropium (SPIRIVA) 18 MCG inhalation capsule, Place 1 capsule (18 mcg total) into inhaler and inhale at bedtime., Disp: 30 capsule, Rfl: 11 .  traMADol (ULTRAM) 50 MG tablet, TAKE 1 TABLET BY MOUTH EVERY 12 HOURS AS NEEDED FOR MODERATE PAIN, Disp: 90 tablet, Rfl: 0 .  apixaban (ELIQUIS) 5 MG TABS tablet, Take 1 tablet (5 mg total) by mouth 2 (two) times daily. (Patient not taking: Reported on 04/01/2018), Disp: 180 tablet, Rfl: 3  Physical exam:  Vitals:   04/01/18 1132  BP: (!) 154/79    Pulse: 61  Resp: 16  Temp: 98.1 F (36.7 C)  TempSrc: Tympanic  Weight: 207 lb (93.9 kg)   GENERAL: Well-nourished well-developed; Alert, no distress and comfortable.  Accompanied EYES: no pallor or icterus OROPHARYNX: no thrush or ulceration NECK: Positive for cervical adenopathy left > right.  Nontender LUNGS: Clear breath sounds auscultation bilaterally. No wheeze or crackles HEART/CVS: regular rate & rhythm and no murmurs; No lower extremity edema ABDOMEN: abdomen soft, non-tender and normal bowel sounds.  Musculoskeletal: no cyanosis of digits and no clubbing.  Walking stick for ambulation PSYCH: alert & oriented x 3 with fluent speech NEURO: no focal motor/sensory deficits SKIN: no rashes or significant lesions   CMP Latest Ref Rng & Units 04/01/2018  Glucose 70 - 99 mg/dL 116(H)  BUN 8 - 23 mg/dL 18  Creatinine 0.61 - 1.24 mg/dL 0.92  Sodium 135 - 145 mmol/L 141  Potassium 3.5 - 5.1 mmol/L 4.0  Chloride 98 - 111 mmol/L 108  CO2 22 - 32 mmol/L 26  Calcium 8.9 - 10.3 mg/dL 8.6(L)  Total Protein 6.5 - 8.1 g/dL 6.9  Total Bilirubin 0.3 - 1.2 mg/dL 1.1  Alkaline Phos 38 - 126 U/L 91  AST 15 - 41 U/L 20  ALT 0 - 44 U/L 24   CBC Latest Ref Rng & Units 04/01/2018  WBC 3.8 - 10.6 K/uL 120.1(HH)  Hemoglobin 13.0 - 18.0 g/dL 11.1(L)  Hematocrit 40.0 - 52.0 % 36.0(L)  Platelets 150 - 440 K/uL 142(L)    No images are attached to the encounter.  Nm Pet Image Initial (pi) Skull Base To Thigh  Result Date: 03/09/2018 CLINICAL DATA:  Subsequent treatment strategy for chronic lymphocytic leukemia. EXAM: NUCLEAR MEDICINE PET SKULL BASE TO THIGH TECHNIQUE: 10.95 mCi F-18 FDG was injected intravenously. Full-ring PET imaging was performed from the skull base to thigh after the radiotracer. CT data was obtained and used for attenuation correction and anatomic localization. Fasting blood glucose: 104 mg/dl COMPARISON:  07/18/2014 FINDINGS: Mediastinal blood pool activity: SUV max 2.37  Liver activity: SUV max 3.06 NECK: Bilateral cervical adenopathy is identified. Index left level 2 node measures 1.7 cm with SUV max of 3.4. Index right level 2 node measures 1.4 cm with SUV max of 3.4. Incidental CT findings: none CHEST: Extensive bilateral supraclavicular and axillary adenopathy. Index right axillary lymph node measures 2.6 cm and has an SUV max of 3.35. Index left axillary node measures 2.3 cm and has an SUV max of 3.4. Index left supraclavicular lymph node measures 2.2 cm with SUV max of 2.78. Right supraclavicular lymph node measures 1.5 cm with SUV max of 2.21. Mildly hypermetabolic bilateral mediastinal and hilar adenopathy. Index left hilar node measures 1.7 cm with SUV max of 2.89. Index subcarinal lymph node measures 1.4 cm with SUV max of 2.5. No hypermetabolic pulmonary nodules or masses identified. Incidental CT findings: Aortic atherosclerosis. Calcifications in the left main coronary artery, LAD, left circumflex and RCA noted. ABDOMEN/PELVIS: No abnormal radiotracer activity identified within the liver. No abnormal uptake within the pancreas. The spleen measures 12.1 by 6.5 by 13.1 cm (volume = 540 cm^3) No focal areas of splenic hypermetabolism identified. Unremarkable appearance of the adrenal glands. Extensive abdominal adenopathy is identified centered around the retroperitoneum and encasing the IVC and aorta. The tumor mass measures 13.1 x 9.2 by 22 cm. SUV max is equal to 5.24. Bulky bilateral pelvic sidewall adenopathy is also present. Index right pelvic sidewall node measures 3.4  x 6.7 cm with= SUV max of 3.78. The left pelvic sidewall lymph node measures 2.6 x 7.6 cm with SUV max of 3.4. Incidental CT findings: Previous bilateral inguinal herniorrhaphy. Aortic atherosclerosis is identified. Previous cholecystectomy. SKELETON: No focal hypermetabolic activity to suggest skeletal metastasis. Incidental CT findings: Status post bilateral hip arthroplasty. IMPRESSION: 1.  Examination is positive for extensive adenopathy within the neck, chest, abdomen and pelvis. The tumor within the abdomen has an SUV max of 5.24 compatible with Deauville criteria 4. 2. Splenomegaly. 3.  Aortic Atherosclerosis (ICD10-I70.0). 4. Multi vessel coronary artery calcifications including left main disease. Electronically Signed   By: Kerby Moors M.D.   On: 03/09/2018 16:56    Assessment and plan- Patient is a 72 y.o. male diagnosed with CLL who presents to symptom management clinic for hematuria.  1.  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  02/2018 CT PET shows bilateral axilla adenopathy, mediastinal adenopathy, bulky abdominal/retroperitoneal adenopathy, splenomegaly.  No evidence of transformation.  Currently on Imbruvica.  Tolerating well except for mild nausea, fatigue, and hematuria (see below).  WBC 120.1; related to Imbruvica.  Continue to monitor  2.  Hematuria-grade II (macroscopic hematuria w/o clots)-resolved.  Blood counts today and kidney function stable.  Suspect related to concomitant use of Eliquis with Imbruvica which increases bleeding risk.  Discussed with Dr. Rogue Bussing who recommends restarting Imbruvica.  If hematuria recurs, would consider holding Imbruvica until improvement to grade 1 at baseline in dose reduction.  3.  Paroxysmal A. fib-on Eliquis and amiodarone per Dr. Caryl Comes.  Eliquis currently held per Dr. Caryl Comes given hematuria (see above).  Normal sinus rhythm in clinic today with no reports of concerning cardiac symptoms.  Increased risk of atrial fibrillation and atrial flutter with Imbruvica and patient was previously discussed with Dr. Caryl Comes.  Today, discussed with Dr. Rogue Bussing who recommends starting low-dose antiplatelet; aspirin 52m.   4.  Hypertension-history of hypertension, blood pressure today elevated 154/79.  Per patient, elevated due to situation, exertion and within goal at home.  In setting of Imbruvica,  continue to monitor.  Restart Imbruvica.  Start aspirin 81 mg daily.  Monitor for hematuria.  Follow-up with Dr. BRogue Bussingas scheduled for labs and reevaluation.   Visit Diagnosis 1. CLL (chronic lymphocytic leukemia) ([place]   2. Hematuria, unspecified type   3. Paroxysmal atrial fibrillation ([facility])   4. Essential hypertension     Patient expressed understanding and was in agreement with this plan. He also understands that He can call clinic at any time with any questions, concerns, or complaints.   Thank you for allowing me to participate in the care of this very pleasant patient.   LBeckey Rutter DNP, AGNP-C [phone](work cell) [phone](office)

## 2018-04-02 ENCOUNTER — Encounter: Payer: Self-pay | Admitting: Nurse Practitioner

## 2018-04-08 ENCOUNTER — Inpatient Hospital Stay: Payer: Medicare HMO

## 2018-04-08 ENCOUNTER — Encounter: Payer: Self-pay | Admitting: Internal Medicine

## 2018-04-08 ENCOUNTER — Inpatient Hospital Stay (HOSPITAL_BASED_OUTPATIENT_CLINIC_OR_DEPARTMENT_OTHER): Payer: Medicare HMO | Admitting: Internal Medicine

## 2018-04-08 VITALS — BP 143/78 | HR 62 | Temp 97.3°F | Resp 16 | Wt 204.3 lb

## 2018-04-08 DIAGNOSIS — Z7901 Long term (current) use of anticoagulants: Secondary | ICD-10-CM | POA: Diagnosis not present

## 2018-04-08 DIAGNOSIS — R634 Abnormal weight loss: Secondary | ICD-10-CM | POA: Diagnosis not present

## 2018-04-08 DIAGNOSIS — R5383 Other fatigue: Secondary | ICD-10-CM | POA: Diagnosis not present

## 2018-04-08 DIAGNOSIS — I4891 Unspecified atrial fibrillation: Secondary | ICD-10-CM

## 2018-04-08 DIAGNOSIS — I480 Paroxysmal atrial fibrillation: Secondary | ICD-10-CM | POA: Diagnosis not present

## 2018-04-08 DIAGNOSIS — Z87891 Personal history of nicotine dependence: Secondary | ICD-10-CM | POA: Diagnosis not present

## 2018-04-08 DIAGNOSIS — C9110 Chronic lymphocytic leukemia of B-cell type not having achieved remission: Secondary | ICD-10-CM

## 2018-04-08 DIAGNOSIS — I10 Essential (primary) hypertension: Secondary | ICD-10-CM

## 2018-04-08 DIAGNOSIS — R319 Hematuria, unspecified: Secondary | ICD-10-CM | POA: Diagnosis not present

## 2018-04-08 DIAGNOSIS — Z7982 Long term (current) use of aspirin: Secondary | ICD-10-CM | POA: Diagnosis not present

## 2018-04-08 DIAGNOSIS — R310 Gross hematuria: Secondary | ICD-10-CM | POA: Diagnosis not present

## 2018-04-08 LAB — COMPREHENSIVE METABOLIC PANEL
ALK PHOS: 76 U/L (ref 38–126)
ALT: 21 U/L (ref 0–44)
ANION GAP: 5 (ref 5–15)
AST: 21 U/L (ref 15–41)
Albumin: 3.9 g/dL (ref 3.5–5.0)
BUN: 16 mg/dL (ref 8–23)
CALCIUM: 8.8 mg/dL — AB (ref 8.9–10.3)
CO2: 30 mmol/L (ref 22–32)
Chloride: 106 mmol/L (ref 98–111)
Creatinine, Ser: 0.87 mg/dL (ref 0.61–1.24)
GFR calc non Af Amer: >60 mL/min (ref >60)
Glucose, Bld: 102 mg/dL — ABNORMAL HIGH (ref 70–99)
POTASSIUM: 4.4 mmol/L (ref 3.5–5.1)
SODIUM: 141 mmol/L (ref 135–145)
Total Bilirubin: 1 mg/dL (ref 0.3–1.2)
Total Protein: 6.4 g/dL — ABNORMAL LOW (ref 6.5–8.1)

## 2018-04-08 LAB — CBC WITH DIFFERENTIAL/PLATELET
BASOS ABS: 0 10*3/uL (ref 0–0.1)
Basophils Relative: 0 %
EOS ABS: 0 10*3/uL (ref 0–0.7)
Eosinophils Relative: 0 %
HEMATOCRIT: 32.9 % — AB (ref 40.0–52.0)
HEMOGLOBIN: 11.1 g/dL — AB (ref 13.0–18.0)
LYMPHS PCT: 95 %
Lymphs Abs: 138 10*3/uL — ABNORMAL HIGH (ref 1.0–3.6)
MCH: 33.9 pg (ref 26.0–34.0)
MCHC: 33.6 g/dL (ref 32.0–36.0)
MCV: 100.7 fL — ABNORMAL HIGH (ref 80.0–100.0)
MONOS PCT: 1 %
Monocytes Absolute: 1.5 10*3/uL — ABNORMAL HIGH (ref 0.2–1.0)
NEUTROS PCT: 4 %
Neutro Abs: 5.8 10*3/uL (ref 1.4–6.5)
Platelets: 130 10*3/uL — ABNORMAL LOW (ref 150–440)
RBC: 3.26 MIL/uL — ABNORMAL LOW (ref 4.40–5.90)
RDW: 17.6 % — ABNORMAL HIGH (ref 11.5–14.5)
WBC: 145.3 10*3/uL (ref 3.8–10.6)

## 2018-04-08 LAB — LACTATE DEHYDROGENASE: LDH: 133 U/L (ref 98–192)

## 2018-04-08 NOTE — Assessment & Plan Note (Addendum)
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   # On Ibrutinib 420 mg/day- tolerating well-except for fatigue/hematuria [see discussion below].  Lymphocytosis/leukocytosis as expected; mild anemia mild thrombocytopenia platelets of 90.  Monitor for now.  # Hematuria- sec to Eliquis/ibrutinib.  Improved off eliquis; on baby asprin.   # A.fib-currently in sinus rhythm amiodarone- [Dr.Gollan].  Currently off Eliquis because of hematuria while on ibrutinib.  We will plan to restart Eliquis in the next 1 to 2 months based on tolerance.  # Elevated Blood pressure-worse.  140s/80; recommend keeping a log.  If consistently above 957 systolic-defer to cardiology re: for titration of blood pressure medication.  Continue metoprolol 25 mg BID.   # follow up in 3 weeks/labscbc/bmp/MD- Dr.B; daughter will come up pick H&P from today/[facility] visit.  Cc; Dr.Gollan

## 2018-04-08 NOTE — Progress Notes (Signed)
[facility] OFFICE PROGRESS NOTE  Patient Care Team: Leone Haven, MD as PCP - General (Family Medicine) [name], MD as Consulting Physician (Cardiology) Cammie Sickle, MD as Medical Oncologist (Hematology and Oncology)  Cancer Staging No matching staging information was found for the patient.   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  MARCH 2017- CT N/C/A/P- NED. STOP Ritux; surveillance   # AUG 2019- CT/PET- progression/NO transformation   # s/p PPM [Dr.Klein; Sep 2017]; A.fib [on eliquis]  # MAY 2019- 65% OF NUCLEI POSITIVE FOR ATM DELETION; 53% OF NUCLEI POSITIVE FOR TP53 DELETION; IGVH- UN-MUTATED [poor prognosis]  DIAGNOSIS: CLL  STAGE: IV         ;GOALS: control  CURRENT/MOST RECENT THERAPY : Ibrutinib [ aug 21st]      CLL (chronic lymphocytic leukemia) ([place]      INTERVAL HISTORY:  Michael RETHERFORD89y.[id number].  male CLL-high risk currently on ibrutinib is here for follow-up.  In the interim patient had episode of hematuria bright red blood; patient was taken off Eliquis.  Is currently back on ibrutinib.  Patient denies any blood in urine.  Complains of fatigue.  Feels tired.  Continued mild weight loss.  Otherwise denies any headaches nausea vomiting.  He feels his lymph nodes are getting smaller.  Review of Systems  Constitutional: Positive for malaise/fatigue and weight loss. Negative for chills, diaphoresis and fever.  HENT: Negative for nosebleeds and sore throat.   Eyes: Negative for double vision.  Respiratory: Negative for cough, hemoptysis, sputum production, shortness of breath and wheezing.   Cardiovascular: Negative for chest pain, palpitations, orthopnea and leg swelling.  Gastrointestinal: Negative for abdominal pain, blood in stool, constipation, diarrhea, heartburn, melena,  nausea and vomiting.  Genitourinary: Negative for dysuria, frequency and urgency.  Musculoskeletal: Positive for back pain and joint pain.  Skin: Negative.  Negative for itching and rash.  Neurological: Negative for dizziness, tingling, focal weakness, weakness and headaches.  Endo/Heme/Allergies: Bruises/bleeds easily.  Psychiatric/Behavioral: Negative for depression. The patient is not nervous/anxious and does not have insomnia.       PAST MEDICAL HISTORY :  Past Medical History:  Diagnosis Date  . Anxiety   . Arthritis   . Atrial fibrillation ([place]    a. Dx 2013, recurred 02/2014, CHA2DS2VASc = 3 -->placed on Eliquis;  b. 02/2014 Echo: EF 50-55%, mid and apical anterior septum and mid and apical inf septum are abnl, mild to mod Ao sclerosis w/o AS.  .Marland KitchenChicken pox   . [place] lymphocytic leukemia ([place]    a. Dx 02/2014.  .Marland KitchenComplication of anesthesia    History of  PTSD--do not touch patient when waking up from surgery.  .Marland KitchenCOPD (chronic obstructive pulmonary disease) ([place]   . Coronary artery disease    a. 04/2009 CABG x 3 (LIMA->LAD, VG->OM1, VG->PDA);  b. 09/2009 Cath: occluded VG x 2 w/ patent LIMA and L->R collats. EF 55%, mild antlat HK;  c. 10/2011 MV: EF 53%, no isch/infarct-->low risk.  .Marland KitchenDysrhythmia    hx of a-fib  . GERD (gastroesophageal reflux disease)    occasional  . History of chemotherapy 2015-2016  . HOH (hard of hearing)    Bilateral Hearing Aids  . Hypertension   . [place]ocardial infarction ([place] 2010  . OSA on CPAP    USE C-PAP  . Presence  of permanent cardiac pacemaker 2017  . PTSD (post-traumatic stress disorder)   . PTSD (post-traumatic stress disorder)   . Pure hypercholesterolemia   . Rheumatic fever 1959    PAST SURGICAL HISTORY :   Past Surgical History:  Procedure Laterality Date  . ABDOMINAL HERNIA REPAIR    . APPENDECTOMY  06/21/1985  . CARDIAC CATHETERIZATION  2010; 2011   ; Dr Fletcher Anon  . CORONARY ARTERY BYPASS GRAFT  04/2009   "CABG X3"  . EP  IMPLANTABLE DEVICE N/A 03/03/2016   Procedure: Pacemaker Implant;  Surgeon: Deboraha Sprang, MD;  Location: [facility];  Service: Cardiovascular;  Laterality: N/A;  . FOREIGN BODY REMOVAL  1968   "shrapnel in my tailbone"  . INGUINAL HERNIA REPAIR Right   . INSERT / REPLACE / REMOVE PACEMAKER    . JOINT REPLACEMENT Right 2018  . LAPAROSCOPIC CHOLECYSTECTOMY    . TONSILLECTOMY AND ADENOIDECTOMY  1956  . TOTAL HIP ARTHROPLASTY Right 10/22/2016   Procedure: TOTAL HIP ARTHROPLASTY;  Surgeon: Dereck Leep, MD;  Location: [facility];  Service: Orthopedics;  Laterality: Right;  . TOTAL HIP ARTHROPLASTY Left 11/04/2017   Procedure: TOTAL HIP ARTHROPLASTY;  Surgeon: Dereck Leep, MD;  Location: [facility];  Service: Orthopedics;  Laterality: Left;    FAMILY HISTORY :   Family History  Problem Relation Age of Onset  . Heart disease Mother   . Heart attack Mother   . Coronary artery disease Unknown        family history    SOCIAL HISTORY:   Social History   Tobacco Use  . Smoking status: Former Smoker    Packs/day: 1.00    Years: 40.00    Pack years: 40.00    Types: Cigarettes    Last attempt to quit: 07/21/2006    Years since quitting: 11.7  . Smokeless tobacco: Never Used  Substance Use Topics  . Alcohol use: Yes    Alcohol/week: 1.0 standard drinks    Types: 1 Standard drinks or equivalent per week    Comment: rarely  . Drug use: No    ALLERGIES:  has No Known Allergies.  MEDICATIONS:  Current Outpatient Medications  Medication Sig Dispense Refill  . acetaminophen (TYLENOL) 500 MG tablet Take 1,000 mg by mouth every 8 (eight) hours as needed for mild pain.    Marland Kitchen albuterol (PROVENTIL HFA;VENTOLIN HFA) 108 (90 Base) MCG/ACT inhaler Inhale 2 puffs into the lungs every 6 (six) hours as needed for wheezing or shortness of breath. 1 Inhaler 11  . amiodarone (PACERONE) 200 MG tablet Take 0.5 tablets (100 mg total) by mouth 2 (two) times daily. 30 tablet 3  . aspirin 81 MG tablet  Take 81 mg by mouth daily.    Marland Kitchen atorvastatin (LIPITOR) 80 MG tablet Take 1 tablet (80 mg total) by mouth at bedtime. 90 tablet 3  . budesonide-formoterol (SYMBICORT) 160-4.5 MCG/ACT inhaler Inhale 2 puffs into the lungs 2 (two) times daily. 1 Inhaler 11  . cetirizine (ZYRTEC) 10 MG tablet Take 10 mg by mouth daily as needed for allergies.     . Coenzyme Q10 (COQ10) 200 MG CAPS Take 200 mg by mouth daily.    Marland Kitchen ezetimibe (ZETIA) 10 MG tablet Take 1 tablet (10 mg total) by mouth daily. 90 tablet 3  . Ibrutinib 420 MG TABS Take 420 mg by mouth daily. 28 tablet 3  . Krill Oil 350 MG CAPS Take 350 mg by mouth every evening.    Marland Kitchen  metoprolol tartrate (LOPRESSOR) 25 MG tablet Take 25 mg by mouth 2 (two) times daily.    . mirtazapine (REMERON) 15 MG tablet Take 15 mg by mouth at bedtime as needed (for panic associated with PTSD).     . Multiple Vitamin (MULTIVITAMIN WITH MINERALS) TABS tablet Take 1 tablet by mouth daily.    . mupirocin ointment (BACTROBAN) 2 % Apply 1 application topically 3 (three) times daily. 30 g 1  . nitroGLYCERIN (NITROSTAT) 0.4 MG SL tablet Place 1 tablet (0.4 mg total) under the tongue every 5 (five) minutes as needed for chest pain. 25 tablet 6  . tiotropium (SPIRIVA) 18 MCG inhalation capsule Place 1 capsule (18 mcg total) into inhaler and inhale at bedtime. 30 capsule 11  . traMADol (ULTRAM) 50 MG tablet TAKE 1 TABLET BY MOUTH EVERY 12 HOURS AS NEEDED FOR MODERATE PAIN 90 tablet 0  . apixaban (ELIQUIS) 5 MG TABS tablet Take 1 tablet (5 mg total) by mouth 2 (two) times daily. (Patient not taking: Reported on 04/01/2018) 180 tablet 3   No current facility-administered medications for this visit.     PHYSICAL EXAMINATION: ECOG PERFORMANCE STATUS: 1 - Symptomatic but completely ambulatory  BP (!) 143/78 (BP Location: Left Arm, Patient Position: Sitting)   Pulse 62   Temp (!) 97.3 F (36.3 C) (Tympanic)   Resp 16   Wt 204 lb 4.8 oz (92.7 kg)   BMI 31.06 kg/m   Filed  Weights   04/08/18 0959  Weight: 204 lb 4.8 oz (92.7 kg)    Physical Exam  Constitutional: He is oriented to person, place, and time and well-developed, well-nourished, and in no distress.    Accompanied by his daughter.  HENT:  Head: Normocephalic and atraumatic.  Mouth/Throat: Oropharynx is clear and moist. No oropharyngeal exudate.  Eyes: Pupils are equal, round, and reactive to light.  Neck: Normal range of motion. Neck supple.  Cardiovascular: Normal rate and regular rhythm.  Pulmonary/Chest: No respiratory distress. He has no wheezes.  Abdominal: Soft. Bowel sounds are normal. He exhibits no distension and no mass. There is no tenderness. There is no rebound and no guarding.  Musculoskeletal: Normal range of motion. He exhibits no edema or tenderness.  Lymphadenopathy:  Positive for bilateral neck adenopathy left more than right. (improved)  Positive for bilateral axillary adenopathy approximately 1 cm in size. (improved)  Neurological: He is alert and oriented to person, place, and time.  Skin: Skin is warm.  Psychiatric: Affect normal.       LABORATORY DATA:  I have reviewed the data as listed    Component Value Date/Time   NA 141 04/08/2018 0915   NA 139 10/11/2014 1800   K 4.4 04/08/2018 0915   K 3.3 (L) 10/11/2014 1800   CL 106 04/08/2018 0915   CL 106 10/11/2014 1800   CO2 30 04/08/2018 0915   CO2 27 10/11/2014 1800   GLUCOSE 102 (H) 04/08/2018 0915   GLUCOSE 107 (H) 10/11/2014 1800   BUN 16 04/08/2018 0915   BUN 15 10/11/2014 1800   CREATININE 0.87 04/08/2018 0915   CREATININE 0.89 10/11/2014 1800   CALCIUM 8.8 (L) 04/08/2018 0915   CALCIUM 8.8 (L) 10/11/2014 1800   PROT 6.4 (L) 04/08/2018 0915   PROT 6.7 05/18/2017 1048   PROT 6.4 (L) 10/11/2014 1800   ALBUMIN 3.9 04/08/2018 0915   ALBUMIN 4.3 05/18/2017 1048   ALBUMIN 4.1 10/11/2014 1800   AST 21 04/08/2018 0915   AST 23 10/11/2014 1800  ALT 21 04/08/2018 0915   ALT 22 10/11/2014 1800   ALKPHOS  76 04/08/2018 0915   ALKPHOS 61 10/11/2014 1800   BILITOT 1.0 04/08/2018 0915   BILITOT 0.7 05/18/2017 1048   BILITOT 0.9 10/11/2014 1800   GFRNONAA >60 04/08/2018 0915   GFRNONAA >60 10/11/2014 1800   GFRAA >60 04/08/2018 0915   GFRAA >60 10/11/2014 1800    No results found for: SPEP, UPEP  Lab Results  Component Value Date   WBC 145.3 (HH) 04/08/2018   NEUTROABS 5.8 04/08/2018   HGB 11.1 (L) 04/08/2018   HCT 32.9 (L) 04/08/2018   MCV 100.7 (H) 04/08/2018   PLT 130 (L) 04/08/2018      Chemistry      Component Value Date/Time   NA 141 04/08/2018 0915   NA 139 10/11/2014 1800   K 4.4 04/08/2018 0915   K 3.3 (L) 10/11/2014 1800   CL 106 04/08/2018 0915   CL 106 10/11/2014 1800   CO2 30 04/08/2018 0915   CO2 27 10/11/2014 1800   BUN 16 04/08/2018 0915   BUN 15 10/11/2014 1800   CREATININE 0.87 04/08/2018 0915   CREATININE 0.89 10/11/2014 1800      Component Value Date/Time   CALCIUM 8.8 (L) 04/08/2018 0915   CALCIUM 8.8 (L) 10/11/2014 1800   ALKPHOS 76 04/08/2018 0915   ALKPHOS 61 10/11/2014 1800   AST 21 04/08/2018 0915   AST 23 10/11/2014 1800   ALT 21 04/08/2018 0915   ALT 22 10/11/2014 1800   BILITOT 1.0 04/08/2018 0915   BILITOT 0.7 05/18/2017 1048   BILITOT 0.9 10/11/2014 1800       RADIOGRAPHIC STUDIES: I have personally reviewed the radiological images as listed and agreed with the findings in the report. No results found.   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   # On Ibrutinib 420 mg/day- tolerating well-except for fatigue/hematuria [see discussion below].  Lymphocytosis/leukocytosis as expected; mild anemia mild thrombocytopenia platelets of 90.  Monitor for now.  # Hematuria- sec to Eliquis/ibrutinib.   Improved off eliquis; on baby asprin.   # A.fib-currently in sinus rhythm amiodarone- [Dr.Gollan].  Currently off Eliquis because of hematuria while on ibrutinib.  We will plan to restart Eliquis in the next 1 to 2 months based on tolerance.  # Elevated Blood pressure-worse.  140s/80; recommend keeping a log.  If consistently above 540 systolic-defer to cardiology re: for titration of blood pressure medication.  Continue metoprolol 25 mg BID.   # follow up in 3 weeks/labscbc/bmp/MD- Dr.B; daughter will come up pick H&P from today/[facility].  Cc; Dr.Gollan     No orders of the defined types were placed in this encounter.  All questions were answered. The patient knows to call the clinic with any problems, questions or concerns.      Cammie Sickle, MD 04/08/2018 10:42 AM

## 2018-04-12 ENCOUNTER — Other Ambulatory Visit: Payer: Self-pay | Admitting: Cardiovascular Disease

## 2018-04-13 MED FILL — IMBRUVICA 420 MG TAB: 420 | 28 days supply | Qty: 28 | Fill #1

## 2018-04-15 ENCOUNTER — Inpatient Hospital Stay: Payer: Medicare HMO

## 2018-04-15 ENCOUNTER — Telehealth: Payer: Self-pay | Admitting: Cardiovascular Disease

## 2018-04-15 NOTE — Telephone Encounter (Signed)
°*  STAT* If patient is at the pharmacy, call can be transferred to refill team.   1. Which medications need to be refilled? (please list name of each medication and dose if known) amiodarone (PACERONE) 200 MG - 1/2 tablet twice daily  2. Which pharmacy/location (including street and city if local pharmacy) is medication to be sent to? [facility] in [place]   3. Do they need a 30 day or 90 day supply? 90 day   Patient is very concerned about getting this medication. He will like a call from [place] when refill is sent.

## 2018-04-22 ENCOUNTER — Inpatient Hospital Stay: Payer: Non-veteran care

## 2018-04-29 ENCOUNTER — Other Ambulatory Visit: Payer: Self-pay

## 2018-04-29 ENCOUNTER — Inpatient Hospital Stay: Payer: Medicare HMO | Attending: Internal Medicine

## 2018-04-29 ENCOUNTER — Encounter (INDEPENDENT_AMBULATORY_CARE_PROVIDER_SITE_OTHER): Payer: Self-pay

## 2018-04-29 ENCOUNTER — Inpatient Hospital Stay: Payer: Non-veteran care

## 2018-04-29 ENCOUNTER — Inpatient Hospital Stay (HOSPITAL_BASED_OUTPATIENT_CLINIC_OR_DEPARTMENT_OTHER): Payer: Medicare HMO | Admitting: Internal Medicine

## 2018-04-29 VITALS — BP 163/82 | HR 66 | Resp 16 | Wt 210.0 lb

## 2018-04-29 DIAGNOSIS — M545 Low back pain: Secondary | ICD-10-CM | POA: Insufficient documentation

## 2018-04-29 DIAGNOSIS — I10 Essential (primary) hypertension: Secondary | ICD-10-CM

## 2018-04-29 DIAGNOSIS — I4891 Unspecified atrial fibrillation: Secondary | ICD-10-CM | POA: Insufficient documentation

## 2018-04-29 DIAGNOSIS — G8929 Other chronic pain: Secondary | ICD-10-CM | POA: Insufficient documentation

## 2018-04-29 DIAGNOSIS — Z79899 Other long term (current) drug therapy: Secondary | ICD-10-CM | POA: Diagnosis not present

## 2018-04-29 DIAGNOSIS — C9110 Chronic lymphocytic leukemia of B-cell type not having achieved remission: Secondary | ICD-10-CM | POA: Insufficient documentation

## 2018-04-29 DIAGNOSIS — Z87891 Personal history of nicotine dependence: Secondary | ICD-10-CM | POA: Insufficient documentation

## 2018-04-29 DIAGNOSIS — Z7901 Long term (current) use of anticoagulants: Secondary | ICD-10-CM | POA: Diagnosis not present

## 2018-04-29 DIAGNOSIS — R5383 Other fatigue: Secondary | ICD-10-CM | POA: Diagnosis not present

## 2018-04-29 LAB — CBC WITH DIFFERENTIAL/PLATELET
ABS IMMATURE GRANULOCYTES: 0.29 10*3/uL — AB (ref 0.00–0.07)
BASOS PCT: 0 %
Basophils Absolute: 0.1 10*3/uL (ref 0.0–0.1)
EOS ABS: 0.2 10*3/uL (ref 0.0–0.5)
EOS PCT: 0 %
HEMATOCRIT: 38 % — AB (ref 39.0–52.0)
Hemoglobin: 10.7 g/dL — ABNORMAL LOW (ref 13.0–17.0)
Immature Granulocytes: 0 %
LYMPHS ABS: 221.5 10*3/uL — AB (ref 0.7–4.0)
Lymphocytes Relative: 98 %
MCH: 30.3 pg (ref 26.0–34.0)
MCHC: 28.2 g/dL — AB (ref 30.0–36.0)
MCV: 107.6 fL — AB (ref 80.0–100.0)
MONO ABS: 0.3 10*3/uL (ref 0.1–1.0)
MONOS PCT: 0 %
Neutro Abs: 4.3 10*3/uL (ref 1.7–7.7)
Neutrophils Relative %: 2 %
Platelets: 128 10*3/uL — ABNORMAL LOW (ref 150–400)
RBC: 3.53 MIL/uL — ABNORMAL LOW (ref 4.22–5.81)
RDW: 18.6 % — ABNORMAL HIGH (ref 11.5–15.5)
Smear Review: NORMAL
WBC: 226.6 10*3/uL (ref 4.0–10.5)
nRBC: 0 % (ref 0.0–0.2)

## 2018-04-29 NOTE — Progress Notes (Signed)
[facility] OFFICE PROGRESS NOTE  Patient Care Team: Leone Haven, MD as PCP - General (Family Medicine) [name], MD as Consulting Physician (Cardiology) Cammie Sickle, MD as Medical Oncologist (Hematology and Oncology)  Cancer Staging No matching staging information was found for the patient.   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  MARCH 2017- CT N/C/A/P- NED. STOP Ritux; surveillance   # AUG 2019- CT/PET- progression/NO transformation   # s/p PPM [Dr.Klein; Sep 2017]; A.fib [on eliquis]  # MAY 2019- 65% OF NUCLEI POSITIVE FOR ATM DELETION; 53% OF NUCLEI POSITIVE FOR TP53 DELETION; IGVH- UN-MUTATED [poor prognosis]  DIAGNOSIS: CLL  STAGE: IV         ;GOALS: control  CURRENT/MOST RECENT THERAPY : Ibrutinib [ aug 21st]      CLL (chronic lymphocytic leukemia) ([place]      INTERVAL HISTORY:  Michael [id number].o.  male CLL-high risk currently on ibrutinib is here for follow-up.   Patient notes to improvement of his neck adenopathy.   He complains of worsening fatigue.  Complains of chronic back pain joint pains.   Patient does not complain of any blood in urine.  Denies any nosebleeds.  Review of Systems  Constitutional: Positive for malaise/fatigue and weight loss. Negative for chills, diaphoresis and fever.  HENT: Negative for nosebleeds and sore throat.   Eyes: Negative for double vision.  Respiratory: Negative for cough, hemoptysis, sputum production, shortness of breath and wheezing.   Cardiovascular: Negative for chest pain, palpitations, orthopnea and leg swelling.  Gastrointestinal: Negative for abdominal pain, blood in stool, constipation, diarrhea, heartburn, melena, nausea and vomiting.  Genitourinary: Negative for dysuria, frequency and urgency.  Musculoskeletal: Positive for back  pain and joint pain.  Skin: Negative.  Negative for itching and rash.  Neurological: Negative for dizziness, tingling, focal weakness, weakness and headaches.  Psychiatric/Behavioral: Negative for depression. The patient is not nervous/anxious and does not have insomnia.       PAST MEDICAL HISTORY :  Past Medical History:  Diagnosis Date  . Anxiety   . Arthritis   . Atrial fibrillation ([place]    a. Dx 2013, recurred 02/2014, CHA2DS2VASc = 3 -->placed on Eliquis;  b. 02/2014 Echo: EF 50-55%, mid and apical anterior septum and mid and apical inf septum are abnl, mild to mod Ao sclerosis w/o AS.  .Marland KitchenChicken pox   . Chronic lymphocytic leukemia ([place]    a. Dx 02/2014.  .Marland KitchenComplication of anesthesia    History of  PTSD--do not touch patient when waking up from surgery.  .Marland KitchenCOPD (chronic obstructive pulmonary disease) ([place]   . Coronary artery disease    a. 04/2009 CABG x 3 (LIMA->LAD, VG->OM1, VG->PDA);  b. 09/2009 Cath: occluded VG x 2 w/ patent LIMA and L->R collats. EF 55%, mild antlat HK;  c. 10/2011 MV: EF 53%, no isch/infarct-->low risk.  .Marland KitchenDysrhythmia    hx of a-fib  . GERD (gastroesophageal reflux disease)    occasional  . History of chemotherapy 2015-2016  . HOH (hard of hearing)    Bilateral Hearing Aids  . Hypertension   . Myocardial infarction ([place] 2010  . OSA on CPAP    USE C-PAP  . Presence of permanent cardiac pacemaker 2017  . PTSD (post-traumatic stress disorder)   . PTSD (post-traumatic stress disorder)   . Pure  hypercholesterolemia   . Rheumatic fever 1959    PAST SURGICAL HISTORY :   Past Surgical History:  Procedure Laterality Date  . ABDOMINAL HERNIA REPAIR    . APPENDECTOMY  06/21/1985  . CARDIAC CATHETERIZATION  2010; 2011   ; Dr Fletcher Anon  . CORONARY ARTERY BYPASS GRAFT  04/2009   "CABG X3"  . EP IMPLANTABLE DEVICE N/A 03/03/2016   Procedure: Pacemaker Implant;  Surgeon: Deboraha Sprang, MD;  Location: [facility];  Service: Cardiovascular;  Laterality:  N/A;  . FOREIGN BODY REMOVAL  1968   "shrapnel in my tailbone"  . INGUINAL HERNIA REPAIR Right   . INSERT / REPLACE / REMOVE PACEMAKER    . JOINT REPLACEMENT Right 2018  . LAPAROSCOPIC CHOLECYSTECTOMY    . TONSILLECTOMY AND ADENOIDECTOMY  1956  . TOTAL HIP ARTHROPLASTY Right 10/22/2016   Procedure: TOTAL HIP ARTHROPLASTY;  Surgeon: Dereck Leep, MD;  Location: [facility];  Service: Orthopedics;  Laterality: Right;  . TOTAL HIP ARTHROPLASTY Left 11/04/2017   Procedure: TOTAL HIP ARTHROPLASTY;  Surgeon: Dereck Leep, MD;  Location: [facility];  Service: Orthopedics;  Laterality: Left;    FAMILY HISTORY :   Family History  Problem Relation Age of Onset  . Heart disease Mother   . Heart attack Mother   . Coronary artery disease Unknown        family history    SOCIAL HISTORY:   Social History   Tobacco Use  . Smoking status: Former Smoker    Packs/day: 1.00    Years: 40.00    Pack years: 40.00    Types: Cigarettes    Last attempt to quit: 07/21/2006    Years since quitting: 11.7  . Smokeless tobacco: Never Used  Substance Use Topics  . Alcohol use: Yes    Alcohol/week: 1.0 standard drinks    Types: 1 Standard drinks or equivalent per week    Comment: rarely  . Drug use: No    ALLERGIES:  has No Known Allergies.  MEDICATIONS:  Current Outpatient Medications  Medication Sig Dispense Refill  . acetaminophen (TYLENOL) 500 MG tablet Take 1,000 mg by mouth every 8 (eight) hours as needed for mild pain.    Marland Kitchen albuterol (PROVENTIL HFA;VENTOLIN HFA) 108 (90 Base) MCG/ACT inhaler Inhale 2 puffs into the lungs every 6 (six) hours as needed for wheezing or shortness of breath. 1 Inhaler 11  . amiodarone (PACERONE) 200 MG tablet TAKE 1/2 (ONE-HALF) TABLET BY MOUTH TWICE DAILY 90 tablet 0  . aspirin 81 MG tablet Take 81 mg by mouth daily.    Marland Kitchen atorvastatin (LIPITOR) 80 MG tablet Take 1 tablet (80 mg total) by mouth at bedtime. 90 tablet 3  . budesonide-formoterol (SYMBICORT) 160-4.5  MCG/ACT inhaler Inhale 2 puffs into the lungs 2 (two) times daily. 1 Inhaler 11  . cetirizine (ZYRTEC) 10 MG tablet Take 10 mg by mouth daily as needed for allergies.     . Coenzyme Q10 (COQ10) 200 MG CAPS Take 200 mg by mouth daily.    Marland Kitchen ezetimibe (ZETIA) 10 MG tablet Take 1 tablet (10 mg total) by mouth daily. 90 tablet 3  . Ibrutinib 420 MG TABS Take 420 mg by mouth daily. 28 tablet 3  . Krill Oil 350 MG CAPS Take 350 mg by mouth every evening.    . metoprolol tartrate (LOPRESSOR) 25 MG tablet Take 25 mg by mouth 2 (two) times daily.    . mirtazapine (REMERON) 15 MG tablet Take 15  mg by mouth at bedtime as needed (for panic associated with PTSD).     . Multiple Vitamin (MULTIVITAMIN WITH MINERALS) TABS tablet Take 1 tablet by mouth daily.    . mupirocin ointment (BACTROBAN) 2 % Apply 1 application topically 3 (three) times daily. 30 g 1  . nitroGLYCERIN (NITROSTAT) 0.4 MG SL tablet Place 1 tablet (0.4 mg total) under the tongue every 5 (five) minutes as needed for chest pain. 25 tablet 6  . tiotropium (SPIRIVA) 18 MCG inhalation capsule Place 1 capsule (18 mcg total) into inhaler and inhale at bedtime. 30 capsule 11  . traMADol (ULTRAM) 50 MG tablet TAKE 1 TABLET BY MOUTH EVERY 12 HOURS AS NEEDED FOR MODERATE PAIN 90 tablet 0  . apixaban (ELIQUIS) 5 MG TABS tablet Take 1 tablet (5 mg total) by mouth 2 (two) times daily. (Patient not taking: Reported on 04/01/2018) 180 tablet 3   No current facility-administered medications for this visit.     PHYSICAL EXAMINATION: ECOG PERFORMANCE STATUS: 1 - Symptomatic but completely ambulatory  BP (!) 163/82 (BP Location: Left Arm, Patient Position: Sitting)   Pulse 66   Resp 16   Wt 210 lb (95.3 kg)   BMI 31.93 kg/m   Filed Weights   04/29/18 1028  Weight: 210 lb (95.3 kg)    Physical Exam  Constitutional: He is oriented to person, place, and time and well-developed, well-nourished, and in no distress.    Accompanied by his daughter.  HENT:   Head: Normocephalic and atraumatic.  Mouth/Throat: Oropharynx is clear and moist. No oropharyngeal exudate.  Eyes: Pupils are equal, round, and reactive to light.  Neck: Normal range of motion. Neck supple.  Cardiovascular: Normal rate and regular rhythm.  Pulmonary/Chest: No respiratory distress. He has no wheezes.  Abdominal: Soft. Bowel sounds are normal. He exhibits no distension and no mass. There is no tenderness. There is no rebound and no guarding.  Musculoskeletal: Normal range of motion. He exhibits no edema or tenderness.  Lymphadenopathy:  Positive for bilateral neck adenopathy left more than right. (improved)  Positive for bilateral axillary adenopathy approximately 1 cm in size. (improved)  Neurological: He is alert and oriented to person, place, and time.  Skin: Skin is warm.  Psychiatric: Affect normal.       LABORATORY DATA:  I have reviewed the data as listed    Component Value Date/Time   NA 141 04/08/2018 0915   NA 139 10/11/2014 1800   K 4.4 04/08/2018 0915   K 3.3 (L) 10/11/2014 1800   CL 106 04/08/2018 0915   CL 106 10/11/2014 1800   CO2 30 04/08/2018 0915   CO2 27 10/11/2014 1800   GLUCOSE 102 (H) 04/08/2018 0915   GLUCOSE 107 (H) 10/11/2014 1800   BUN 16 04/08/2018 0915   BUN 15 10/11/2014 1800   CREATININE 0.87 04/08/2018 0915   CREATININE 0.89 10/11/2014 1800   CALCIUM 8.8 (L) 04/08/2018 0915   CALCIUM 8.8 (L) 10/11/2014 1800   PROT 6.4 (L) 04/08/2018 0915   PROT 6.7 05/18/2017 1048   PROT 6.4 (L) 10/11/2014 1800   ALBUMIN 3.9 04/08/2018 0915   ALBUMIN 4.3 05/18/2017 1048   ALBUMIN 4.1 10/11/2014 1800   AST 21 04/08/2018 0915   AST 23 10/11/2014 1800   ALT 21 04/08/2018 0915   ALT 22 10/11/2014 1800   ALKPHOS 76 04/08/2018 0915   ALKPHOS 61 10/11/2014 1800   BILITOT 1.0 04/08/2018 0915   BILITOT 0.7 05/18/2017 1048   BILITOT 0.9  10/11/2014 1800   GFRNONAA >60 04/08/2018 0915   GFRNONAA >60 10/11/2014 1800   GFRAA >60 04/08/2018 0915    GFRAA >60 10/11/2014 1800    No results found for: SPEP, UPEP  Lab Results  Component Value Date   WBC 226.6 (HH) 04/29/2018   NEUTROABS 4.3 04/29/2018   HGB 10.7 (L) 04/29/2018   HCT 38.0 (L) 04/29/2018   MCV 107.6 (H) 04/29/2018   PLT 128 (L) 04/29/2018      Chemistry      Component Value Date/Time   NA 141 04/08/2018 0915   NA 139 10/11/2014 1800   K 4.4 04/08/2018 0915   K 3.3 (L) 10/11/2014 1800   CL 106 04/08/2018 0915   CL 106 10/11/2014 1800   CO2 30 04/08/2018 0915   CO2 27 10/11/2014 1800   BUN 16 04/08/2018 0915   BUN 15 10/11/2014 1800   CREATININE 0.87 04/08/2018 0915   CREATININE 0.89 10/11/2014 1800      Component Value Date/Time   CALCIUM 8.8 (L) 04/08/2018 0915   CALCIUM 8.8 (L) 10/11/2014 1800   ALKPHOS 76 04/08/2018 0915   ALKPHOS 61 10/11/2014 1800   AST 21 04/08/2018 0915   AST 23 10/11/2014 1800   ALT 21 04/08/2018 0915   ALT 22 10/11/2014 1800   BILITOT 1.0 04/08/2018 0915   BILITOT 0.7 05/18/2017 1048   BILITOT 0.9 10/11/2014 1800       RADIOGRAPHIC STUDIES: I have personally reviewed the radiological images as listed and agreed with the findings in the report. No results found.   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   # On Ibrutinib 420 mg/day- Tolerating well-except for fatigue;  Lymphocytosis/leukocytosis as expected; mild anemia mild thrombocytopenia platelets of 90.  Monitor for now.  Discussed that we will get a CT scan approximately 3 months for evaluation.  And if patient is having a significant response; I would recommend venatoclax.   # A.fib-currently in sinus rhythm amiodarone- [Dr.Gollan].  Currently off Eliquis [ [hematuria while on ibrutinib].   # Elevated Blood pressure-worse.   162/78;  Again reminded keeping a log.  If consistently above 038 systolic-defer to cardiology re: for titration of blood pressure medication.  Continue metoprolol 25 mg BID.   # DISPOSITION:  # follow up in 4 weeks/labs-cbc./cmp-Dr.B  Cc; Dr.Gollan     Orders Placed This Encounter  Procedures  . CBC with Differential/Platelet    Standing Status:   Future    Standing Expiration Date:   04/30/2019  . Comprehensive metabolic panel    Standing Status:   Future    Standing Expiration Date:   04/30/2019   All questions were answered. The patient knows to call the clinic with any problems, questions or concerns.      Cammie Sickle, MD 04/30/2018 10:30 PM

## 2018-04-29 NOTE — Assessment & Plan Note (Addendum)
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   # On Ibrutinib 420 mg/day- Tolerating well-except for fatigue;  Lymphocytosis/leukocytosis as expected; mild anemia mild thrombocytopenia platelets of 90.  Monitor for now.  Discussed that we will get a CT scan approximately 3 months for evaluation.  And if patient is having a significant response; I would recommend venatoclax.   # A.fib-currently in sinus rhythm amiodarone- [Dr.Gollan].  Currently off Eliquis [ [hematuria while on ibrutinib].   # Elevated Blood pressure-worse.  162/78;  Again reminded keeping a log.  If consistently above 782 systolic-defer to cardiology re: for titration of blood pressure medication.  Continue metoprolol 25 mg BID.   # DISPOSITION:  # follow up in 4 weeks/labs-cbc./cmp-Dr.B  Cc; Dr.Gollan

## 2018-05-06 ENCOUNTER — Inpatient Hospital Stay: Payer: Non-veteran care

## 2018-05-10 MED FILL — IMBRUVICA 420 MG TAB: 420 | 28 days supply | Qty: 28 | Fill #2

## 2018-05-11 ENCOUNTER — Ambulatory Visit: Payer: Medicare HMO | Admitting: Family Medicine

## 2018-05-12 ENCOUNTER — Encounter: Payer: Self-pay | Admitting: Family Medicine

## 2018-05-12 ENCOUNTER — Ambulatory Visit (INDEPENDENT_AMBULATORY_CARE_PROVIDER_SITE_OTHER): Payer: Medicare HMO | Admitting: Family Medicine

## 2018-05-12 VITALS — BP 138/90 | HR 68 | Temp 97.6°F | Ht 68.0 in | Wt 210.8 lb

## 2018-05-12 DIAGNOSIS — C9110 Chronic lymphocytic leukemia of B-cell type not having achieved remission: Secondary | ICD-10-CM | POA: Diagnosis not present

## 2018-05-12 DIAGNOSIS — I10 Essential (primary) hypertension: Secondary | ICD-10-CM

## 2018-05-12 DIAGNOSIS — R310 Gross hematuria: Secondary | ICD-10-CM | POA: Diagnosis not present

## 2018-05-12 DIAGNOSIS — J432 Centrilobular emphysema: Secondary | ICD-10-CM | POA: Diagnosis not present

## 2018-05-12 DIAGNOSIS — C44519 Basal cell carcinoma of skin of other part of trunk: Secondary | ICD-10-CM | POA: Diagnosis not present

## 2018-05-12 DIAGNOSIS — I480 Paroxysmal atrial fibrillation: Secondary | ICD-10-CM | POA: Diagnosis not present

## 2018-05-12 DIAGNOSIS — Z1159 Encounter for screening for other viral diseases: Secondary | ICD-10-CM | POA: Diagnosis not present

## 2018-05-12 NOTE — Assessment & Plan Note (Signed)
Adequately controlled.  He will continue his current regimen. 

## 2018-05-12 NOTE — Assessment & Plan Note (Signed)
This was felt by oncology to be related to his medication.  He has had no recurrence since being off of Eliquis.  He will continue to follow with oncology regarding this.

## 2018-05-12 NOTE — Progress Notes (Signed)
  [name], MD Phone: [phone]  [name] is a 72 y.o. male who presents today for f/u.  CC: CLL, A. fib, hematuria, COPD  Patient is following with the cancer center.  He has undergone treatment for his CLL.  He notes the lymph nodes have been getting smaller.  He sees them again in November and they will do a CT scan.  He does note the treatment resulted in some gross hematuria as a side effect.  He was on Eliquis when this occurred.  They stopped his Eliquis and he is currently off of this.  Cardiology was involved in the decision based on review of the note.  He has had no additional bleeding issues.  He notes rare episodes of A. fib typically when he first gets out of bed.  Resolves quickly.  He remains on amiodarone and metoprolol.  He is taking an aspirin.  He notes no chest pain, edema, or cough.  He does note occasional wheezing.  He notes stable shortness of breath.  He does use his inhalers.  Social History   Tobacco Use  Smoking Status Former Smoker  . Packs/day: 1.00  . Years: 40.00  . Pack years: 40.00  . Types: Cigarettes  . Last attempt to quit: 07/21/2006  . Years since quitting: 11.8  Smokeless Tobacco Never Used     ROS see history of present illness  Objective  Physical Exam Vitals:   05/12/18 1441  BP: 138/90  Pulse: 68  Temp: 97.6 F (36.4 C)  SpO2: 97%    BP Readings from Last 3 Encounters:  05/12/18 138/90  04/29/18 (!) 163/82  04/08/18 (!) 143/78   Wt Readings from Last 3 Encounters:  05/12/18 210 lb 12.8 oz (95.6 kg)  04/29/18 210 lb (95.3 kg)  04/08/18 204 lb 4.8 oz (92.7 kg)    Physical Exam  Constitutional: No distress.  Cardiovascular: Normal rate, regular rhythm and normal heart sounds.  Pulmonary/Chest: Effort normal and breath sounds normal.  Musculoskeletal: He exhibits no edema.  Neurological: He is alert.  Skin: Skin is warm and dry. He is not diaphoretic.     Assessment/Plan: Please see individual problem  list.  Essential hypertension Adequately controlled.  He will continue his current regimen.  Paroxysmal atrial fibrillation (HCC) Rare symptoms.  He will continue to follow with cardiology.  COPD (chronic obstructive pulmonary disease) Stable.  He will continue his current inhaler regimen.  CLL (chronic lymphocytic leukemia) ([place]) He will continue to see oncology.  Gross hematuria This was felt by oncology to be related to his medication.  He has had no recurrence since being off of Eliquis.  He will continue to follow with oncology regarding this.   Health Maintenance: Hepatitis C screening completed.  Orders Placed This Encounter  Procedures  . Hepatitis C Antibody    No orders of the defined types were placed in this encounter.    [name], MD [place]

## 2018-05-12 NOTE — Patient Instructions (Signed)
Nice to see you. We will check a Hep C test and call with the results.

## 2018-05-12 NOTE — Assessment & Plan Note (Signed)
He will continue to see oncology. 

## 2018-05-12 NOTE — Assessment & Plan Note (Signed)
Stable.  He will continue his current inhaler regimen.

## 2018-05-12 NOTE — Assessment & Plan Note (Signed)
Rare symptoms.  He will continue to follow with cardiology.

## 2018-05-13 ENCOUNTER — Inpatient Hospital Stay: Payer: Non-veteran care

## 2018-05-13 LAB — HEPATITIS C ANTIBODY
Hepatitis C Ab: NONREACTIVE
SIGNAL TO CUT-OFF: 0.04 (ref <1.00)

## 2018-05-18 ENCOUNTER — Ambulatory Visit (INDEPENDENT_AMBULATORY_CARE_PROVIDER_SITE_OTHER): Payer: Medicare HMO | Admitting: *Deleted

## 2018-05-18 DIAGNOSIS — I495 Sick sinus syndrome: Secondary | ICD-10-CM | POA: Diagnosis not present

## 2018-05-18 NOTE — Progress Notes (Signed)
Remote pacemaker transmission.   

## 2018-05-20 ENCOUNTER — Inpatient Hospital Stay: Payer: Non-veteran care

## 2018-05-27 ENCOUNTER — Inpatient Hospital Stay: Payer: Non-veteran care

## 2018-05-27 ENCOUNTER — Encounter: Payer: Self-pay | Admitting: Internal Medicine

## 2018-05-27 ENCOUNTER — Other Ambulatory Visit: Payer: Self-pay

## 2018-05-27 ENCOUNTER — Inpatient Hospital Stay: Payer: Medicare HMO | Attending: Internal Medicine

## 2018-05-27 ENCOUNTER — Inpatient Hospital Stay (HOSPITAL_BASED_OUTPATIENT_CLINIC_OR_DEPARTMENT_OTHER): Payer: Medicare HMO | Admitting: Internal Medicine

## 2018-05-27 VITALS — BP 147/81 | HR 65 | Temp 97.1°F | Resp 16

## 2018-05-27 DIAGNOSIS — I4891 Unspecified atrial fibrillation: Secondary | ICD-10-CM | POA: Insufficient documentation

## 2018-05-27 DIAGNOSIS — R5383 Other fatigue: Secondary | ICD-10-CM | POA: Diagnosis not present

## 2018-05-27 DIAGNOSIS — I10 Essential (primary) hypertension: Secondary | ICD-10-CM

## 2018-05-27 DIAGNOSIS — C9110 Chronic lymphocytic leukemia of B-cell type not having achieved remission: Secondary | ICD-10-CM | POA: Diagnosis not present

## 2018-05-27 DIAGNOSIS — Z87891 Personal history of nicotine dependence: Secondary | ICD-10-CM

## 2018-05-27 DIAGNOSIS — Z79899 Other long term (current) drug therapy: Secondary | ICD-10-CM | POA: Diagnosis not present

## 2018-05-27 LAB — CBC WITH DIFFERENTIAL/PLATELET
Abs Immature Granulocytes: 0.27 10*3/uL — ABNORMAL HIGH (ref 0.00–0.07)
BASOS ABS: 0.1 10*3/uL (ref 0.0–0.1)
Basophils Relative: 0 %
Eosinophils Absolute: 0.2 10*3/uL (ref 0.0–0.5)
Eosinophils Relative: 0 %
HCT: 37.4 % — ABNORMAL LOW (ref 39.0–52.0)
Hemoglobin: 10.6 g/dL — ABNORMAL LOW (ref 13.0–17.0)
IMMATURE GRANULOCYTES: 0 %
LYMPHS PCT: 98 %
Lymphs Abs: 246.4 10*3/uL — ABNORMAL HIGH (ref 0.7–4.0)
MCH: 30.5 pg (ref 26.0–34.0)
MCHC: 28.3 g/dL — ABNORMAL LOW (ref 30.0–36.0)
MCV: 107.8 fL — ABNORMAL HIGH (ref 80.0–100.0)
Monocytes Absolute: 0.5 10*3/uL (ref 0.1–1.0)
Monocytes Relative: 0 %
NEUTROS ABS: 4.5 10*3/uL (ref 1.7–7.7)
NEUTROS PCT: 2 %
NRBC: 0 % (ref 0.0–0.2)
PLATELETS: 134 10*3/uL — AB (ref 150–400)
RBC: 3.47 MIL/uL — AB (ref 4.22–5.81)
RDW: 17.7 % — AB (ref 11.5–15.5)
WBC: 252 10*3/uL — AB (ref 4.0–10.5)

## 2018-05-27 LAB — COMPREHENSIVE METABOLIC PANEL
ALT: 23 U/L (ref 0–44)
ANION GAP: 4 — AB (ref 5–15)
AST: 20 U/L (ref 15–41)
Albumin: 3.8 g/dL (ref 3.5–5.0)
Alkaline Phosphatase: 80 U/L (ref 38–126)
BUN: 17 mg/dL (ref 8–23)
CHLORIDE: 108 mmol/L (ref 98–111)
CO2: 29 mmol/L (ref 22–32)
Calcium: 8.6 mg/dL — ABNORMAL LOW (ref 8.9–10.3)
Creatinine, Ser: 0.87 mg/dL (ref 0.61–1.24)
GFR calc non Af Amer: >60 mL/min (ref >60)
Glucose, Bld: 95 mg/dL (ref 70–99)
POTASSIUM: 4.4 mmol/L (ref 3.5–5.1)
Sodium: 141 mmol/L (ref 135–145)
Total Bilirubin: 1 mg/dL (ref 0.3–1.2)
Total Protein: 6.6 g/dL (ref 6.5–8.1)

## 2018-05-27 NOTE — Progress Notes (Signed)
[place] OFFICE PROGRESS NOTE  Patient Care Team: Leone Haven, MD as PCP - General (Family Medicine) [name], MD as Consulting Physician (Cardiology) Cammie Sickle, MD as Medical Oncologist (Hematology and Oncology)  Cancer Staging No matching staging information was found for the patient.   Oncology History   # AUG 2015- SLL/CLL [Right Ax Ln Bx] s/p Benda-Rituxan x6 [finished March 2016]; Maintenance Rituxan q 23m[started April 2016; Dr.Pandit];Last Ritux Jan 2017.  MARCH 2017- CT N/C/A/P- NED. STOP Ritux; surveillance   # AUG 2019- CT/PET- progression/NO transformation   # s/p PPM [Dr.Klein; Sep 2017]; A.fib [on eliquis]  # MAY 2019- 65% OF NUCLEI POSITIVE FOR ATM DELETION; 53% OF NUCLEI POSITIVE FOR TP53 DELETION; IGVH- UN-MUTATED [poor prognosis]  DIAGNOSIS: CLL  STAGE: IV  ;GOALS: control  CURRENT/MOST RECENT THERAPY : Ibrutinib [ mid sep 2019]      CLL (chronic lymphocytic leukemia) ([place]      INTERVAL HISTORY:  TDAYLIN [id number].[id number].  male CLL-high risk currently on ibrutinib is here for follow-up.   Patient continues to note improvement of his neck adenopathy.  His energy levels are improving.  Continues to complain of mild fatigue.  Appetite is improving.  Gaining weight.  No blood in urine.  No easy bruising.  Review of Systems  Constitutional: Positive for malaise/fatigue. Negative for chills, diaphoresis and fever.  HENT: Negative for nosebleeds and sore throat.   Eyes: Negative for double vision.  Respiratory: Negative for cough, hemoptysis, sputum production, shortness of breath and wheezing.   Cardiovascular: Negative for chest pain, palpitations, orthopnea and leg swelling.  Gastrointestinal: Negative for abdominal pain, blood in stool, constipation, diarrhea, heartburn, melena, nausea and vomiting.  Genitourinary: Negative for dysuria, frequency and urgency.  Musculoskeletal: Positive for back pain and joint  pain.  Skin: Negative.  Negative for itching and rash.  Neurological: Negative for dizziness, tingling, focal weakness, weakness and headaches.  Psychiatric/Behavioral: Negative for depression. The patient is not nervous/anxious and does not have insomnia.       PAST MEDICAL HISTORY :  Past Medical History:  Diagnosis Date  . Anxiety   . Arthritis   . Atrial fibrillation ([place]    a. Dx 2013, recurred 02/2014, CHA2DS2VASc = 3 -->placed on Eliquis;  b. 02/2014 Echo: EF 50-55%, mid and apical anterior septum and mid and apical inf septum are abnl, mild to mod Ao sclerosis w/o AS.  .Marland KitchenChicken pox   . Chronic lymphocytic leukemia ([facility]    a. Dx 02/2014.  .Marland KitchenComplication of anesthesia    History of  PTSD--do not touch patient when waking up from surgery.  .Marland KitchenCOPD (chronic obstructive pulmonary disease) ([place]   . Coronary artery disease    a. 04/2009 CABG x 3 (LIMA->LAD, VG->OM1, VG->PDA);  b. 09/2009 Cath: occluded VG x 2 w/ patent LIMA and L->R collats. EF 55%, mild antlat HK;  c. 10/2011 MV: EF 53%, no isch/infarct-->low risk.  .Marland KitchenDysrhythmia    hx of a-fib  . GERD (gastroesophageal reflux disease)    occasional  . History of chemotherapy 2015-2016  . HOH (hard of hearing)    Bilateral Hearing Aids  . Hypertension   . Myocardial infarction ([place] 2010  . OSA on CPAP    USE C-PAP  . Presence of permanent cardiac pacemaker 2017  . PTSD (post-traumatic stress disorder)   . PTSD (post-traumatic stress disorder)   . Pure hypercholesterolemia   . Rheumatic fever 1959  PAST SURGICAL HISTORY :   Past Surgical History:  Procedure Laterality Date  . ABDOMINAL HERNIA REPAIR    . APPENDECTOMY  06/21/1985  . CARDIAC CATHETERIZATION  2010; 2011   ; Dr Fletcher Anon  . CORONARY ARTERY BYPASS GRAFT  04/2009   "CABG X3"  . EP IMPLANTABLE DEVICE N/A 03/03/2016   Procedure: Pacemaker Implant;  Surgeon: Deboraha Sprang, MD;  Location: [place] [facility];  Service: Cardiovascular;  Laterality: N/A;  .  FOREIGN BODY REMOVAL  1968   "shrapnel in my tailbone"  . INGUINAL HERNIA REPAIR Right   . INSERT / REPLACE / REMOVE PACEMAKER    . JOINT REPLACEMENT Right 2018  . LAPAROSCOPIC CHOLECYSTECTOMY    . TONSILLECTOMY AND ADENOIDECTOMY  1956  . TOTAL HIP ARTHROPLASTY Right 10/22/2016   Procedure: TOTAL HIP ARTHROPLASTY;  Surgeon: Dereck Leep, MD;  Location: [facility];  Service: Orthopedics;  Laterality: Right;  . TOTAL HIP ARTHROPLASTY Left 11/04/2017   Procedure: TOTAL HIP ARTHROPLASTY;  Surgeon: Dereck Leep, MD;  Location: [facility];  Service: Orthopedics;  Laterality: Left;    FAMILY HISTORY :   Family History  Problem Relation Age of Onset  . Heart disease Mother   . Heart attack Mother   . Coronary artery disease Unknown        family history    SOCIAL HISTORY:   Social History   Tobacco Use  . Smoking status: Former Smoker    Packs/day: 1.00    Years: 40.00    Pack years: 40.00    Types: Cigarettes    Last attempt to quit: 07/21/2006    Years since quitting: 11.8  . Smokeless tobacco: Never Used  Substance Use Topics  . Alcohol use: Yes    Alcohol/week: 1.0 standard drinks    Types: 1 Standard drinks or equivalent per week    Comment: rarely  . Drug use: No    ALLERGIES:  has No Known Allergies.  MEDICATIONS:  Current Outpatient Medications  Medication Sig Dispense Refill  . acetaminophen (TYLENOL) 500 MG tablet Take 1,000 mg by mouth every 8 (eight) hours as needed for mild pain.    Marland Kitchen albuterol (PROVENTIL HFA;VENTOLIN HFA) 108 (90 Base) MCG/ACT inhaler Inhale 2 puffs into the lungs every 6 (six) hours as needed for wheezing or shortness of breath. 1 Inhaler 11  . amiodarone (PACERONE) 200 MG tablet TAKE 1/2 (ONE-HALF) TABLET BY MOUTH TWICE DAILY 90 tablet 0  . apixaban (ELIQUIS) 5 MG TABS tablet Take 1 tablet (5 mg total) by mouth 2 (two) times daily. 180 tablet 3  . aspirin 81 MG tablet Take 81 mg by mouth daily.    Marland Kitchen atorvastatin (LIPITOR) 80 MG tablet Take 1  tablet (80 mg total) by mouth at bedtime. 90 tablet 3  . budesonide-formoterol (SYMBICORT) 160-4.5 MCG/ACT inhaler Inhale 2 puffs into the lungs 2 (two) times daily. 1 Inhaler 11  . cetirizine (ZYRTEC) 10 MG tablet Take 10 mg by mouth daily as needed for allergies.     . Coenzyme Q10 (COQ10) 200 MG CAPS Take 200 mg by mouth daily.    Marland Kitchen ezetimibe (ZETIA) 10 MG tablet Take 1 tablet (10 mg total) by mouth daily. 90 tablet 3  . Ibrutinib 420 MG TABS Take 420 mg by mouth daily. 28 tablet 3  . Krill Oil 350 MG CAPS Take 350 mg by mouth every evening.    . metoprolol tartrate (LOPRESSOR) 25 MG tablet Take 25 mg by mouth 2 (two)  times daily.    . mirtazapine (REMERON) 15 MG tablet Take 15 mg by mouth at bedtime as needed (for panic associated with PTSD).     . Multiple Vitamin (MULTIVITAMIN WITH MINERALS) TABS tablet Take 1 tablet by mouth daily.    . mupirocin ointment (BACTROBAN) 2 % Apply 1 application topically 3 (three) times daily. 30 g 1  . nitroGLYCERIN (NITROSTAT) 0.4 MG SL tablet Place 1 tablet (0.4 mg total) under the tongue every 5 (five) minutes as needed for chest pain. 25 tablet 6  . tiotropium (SPIRIVA) 18 MCG inhalation capsule Place 1 capsule (18 mcg total) into inhaler and inhale at bedtime. 30 capsule 11  . traMADol (ULTRAM) 50 MG tablet TAKE 1 TABLET BY MOUTH EVERY 12 HOURS AS NEEDED FOR MODERATE PAIN 90 tablet 0   No current facility-administered medications for this visit.     PHYSICAL EXAMINATION: ECOG PERFORMANCE STATUS: 1 - Symptomatic but completely ambulatory  BP (!) 147/81 (BP Location: Left Arm, Patient Position: Sitting)   Pulse 65   Temp (!) 97.1 F (36.2 C) (Tympanic)   Resp 16   There were no vitals filed for this visit.  Physical Exam  Constitutional: He is oriented to person, place, and time and well-developed, well-nourished, and in no distress.    Accompanied by his wife.  HENT:  Head: Normocephalic and atraumatic.  Mouth/Throat: Oropharynx is clear and  moist. No oropharyngeal exudate.  Eyes: Pupils are equal, round, and reactive to light.  Neck: Normal range of motion. Neck supple.  Cardiovascular: Normal rate and regular rhythm.  Pulmonary/Chest: No respiratory distress. He has no wheezes.  Abdominal: Soft. Bowel sounds are normal. He exhibits no distension and no mass. There is no tenderness. There is no rebound and no guarding.  Musculoskeletal: Normal range of motion. He exhibits no edema or tenderness.  Lymphadenopathy:  Positive for bilateral neck adenopathy left more than right. (improved)  Positive for bilateral axillary adenopathy approximately 1 cm in size. (improved)  Neurological: He is alert and oriented to person, place, and time.  Skin: Skin is warm.  Psychiatric: Affect normal.       LABORATORY DATA:  I have reviewed the data as listed    Component Value Date/Time   NA 141 05/27/2018 1017   NA 139 10/11/2014 1800   K 4.4 05/27/2018 1017   K 3.3 (L) 10/11/2014 1800   CL 108 05/27/2018 1017   CL 106 10/11/2014 1800   CO2 29 05/27/2018 1017   CO2 27 10/11/2014 1800   GLUCOSE 95 05/27/2018 1017   GLUCOSE 107 (H) 10/11/2014 1800   BUN 17 05/27/2018 1017   BUN 15 10/11/2014 1800   CREATININE 0.87 05/27/2018 1017   CREATININE 0.89 10/11/2014 1800   CALCIUM 8.6 (L) 05/27/2018 1017   CALCIUM 8.8 (L) 10/11/2014 1800   PROT 6.6 05/27/2018 1017   PROT 6.7 05/18/2017 1048   PROT 6.4 (L) 10/11/2014 1800   ALBUMIN 3.8 05/27/2018 1017   ALBUMIN 4.3 05/18/2017 1048   ALBUMIN 4.1 10/11/2014 1800   AST 20 05/27/2018 1017   AST 23 10/11/2014 1800   ALT 23 05/27/2018 1017   ALT 22 10/11/2014 1800   ALKPHOS 80 05/27/2018 1017   ALKPHOS 61 10/11/2014 1800   BILITOT 1.0 05/27/2018 1017   BILITOT 0.7 05/18/2017 1048   BILITOT 0.9 10/11/2014 1800   GFRNONAA >60 05/27/2018 1017   GFRNONAA >60 10/11/2014 1800   GFRAA >60 05/27/2018 1017   GFRAA >60 10/11/2014 1800  No results found for: SPEP, UPEP  Lab Results   Component Value Date   WBC 252.0 (HH) 05/27/2018   NEUTROABS 4.5 05/27/2018   HGB 10.6 (L) 05/27/2018   HCT 37.4 (L) 05/27/2018   MCV 107.8 (H) 05/27/2018   PLT 134 (L) 05/27/2018      Chemistry      Component Value Date/Time   NA 141 05/27/2018 1017   NA 139 10/11/2014 1800   K 4.4 05/27/2018 1017   K 3.3 (L) 10/11/2014 1800   CL 108 05/27/2018 1017   CL 106 10/11/2014 1800   CO2 29 05/27/2018 1017   CO2 27 10/11/2014 1800   BUN 17 05/27/2018 1017   BUN 15 10/11/2014 1800   CREATININE 0.87 05/27/2018 1017   CREATININE 0.89 10/11/2014 1800      Component Value Date/Time   CALCIUM 8.6 (L) 05/27/2018 1017   CALCIUM 8.8 (L) 10/11/2014 1800   ALKPHOS 80 05/27/2018 1017   ALKPHOS 61 10/11/2014 1800   AST 20 05/27/2018 1017   AST 23 10/11/2014 1800   ALT 23 05/27/2018 1017   ALT 22 10/11/2014 1800   BILITOT 1.0 05/27/2018 1017   BILITOT 0.7 05/18/2017 1048   BILITOT 0.9 10/11/2014 1800       RADIOGRAPHIC STUDIES: I have personally reviewed the radiological images as listed and agreed with the findings in the report. No results found.   ASSESSMENT & PLAN:  CLL (chronic lymphocytic leukemia) ([place]) #Recurrent CLL [facility] unmutated/p53/deletion 11] August 2019 CT scan/PET shows-bilateral axilla adenopathy mediastinal adenopathy bulky abdominal/retroperitoneal adenopathy; splenomegaly- NO evidence of transformation.  Improvement noted  #On ibrutinib since beginning of September 2019.  Tolerating well clinical improvement noted continue ibrutinib.  Discussed that we will plan to get imaging after about 3 months on ibrutinib.  Could consider use of venatoclax-for finite duration of therapy.  # A.fib-currently in sinus rhythm amiodarone- [Dr.Gollan].  Off Eliquis.  #Hypertension well-controlled.  # DISPOSITION:  # follow up in 4 weeks/labs-cbc./cmp/[facility]-Dr.B; will order CT scan at next visit.  Cc; Dr.Gollan     No orders of the defined types were placed in this  encounter.  All questions were answered. The patient knows to call the clinic with any problems, questions or concerns.      Cammie Sickle, MD 05/28/2018 10:35 AM

## 2018-05-27 NOTE — Assessment & Plan Note (Addendum)
#  Recurrent CLL [facility] unmutated/p53/deletion 11] August 2019 CT scan/PET shows-bilateral axilla adenopathy mediastinal adenopathy bulky abdominal/retroperitoneal adenopathy; splenomegaly- NO evidence of transformation.  Improvement noted  #On ibrutinib since beginning of September 2019.  Tolerating well clinical improvement noted continue ibrutinib.  Discussed that we will plan to get imaging after about 3 months on ibrutinib.  Could consider use of venatoclax-for finite duration of therapy.  # A.fib-currently in sinus rhythm amiodarone- [Dr.Gollan].  Off Eliquis.  #Hypertension well-controlled.  # DISPOSITION:  # follow up in 4 weeks/labs-cbc./cmp/[facility]-Dr.B; will order CT scan at next visit.  Cc; Dr.Gollan

## 2018-06-01 NOTE — Progress Notes (Signed)
. Cardiology Office Note  Date:  06/03/2018   ID:  [name], DOB 1946/03/23, MRN [id number]  PCP:  Leone Haven, MD   Chief Complaint  Patient presents with  . other    6 month f/u pt not taking Eliquis due to cancer treatment at the time. Meds reviewed verbally with pt.     HPI:  [name] is a 72 year old gentleman with a history of  smoking, coronary artery disease,  cabg,  catheterization March 2011 with occluded vein graft 2, patent LIMA, collaterals from left to right,  atrial fibrillation March 2013, 10/11/2014. Converted to normal with metoprolol,  Possible conversion pause. 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  "Cancer going away" Off eliquis, hematuria "a lot" WBC 252  BP running high in the AM only, Blood pressure seems to run low in the afternoon PTSD running it up, bad dreams  No regular exercise program, works on his farm, goats Chronic mild shortness of breath, chronic leg pain  No chest pain concerning for angina No tachycardia Previous statin myalgias but tolerating medication well now   EKG personally reviewed by myself on todays visit Shows paced rhythm 67 bpm  Other past medical history reviewed Left total hip on the right 10/22/16  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, no mode switches as of February 2018  fell off a ladder beginning of October 2017 [facility]: 04/25/2016 showing large hematoma Right gluteal intramuscular hematoma  measuring 9.6 x 6.8 x 4.9 cm [facility] stopped eliquis, statin (unclear why they stopped his statin)  Working with the [facility] for PTSD, reports feeling well, stable Reports that he is been compliant with his Lasix, taking 80 mg daily which is higher dose than before for the past 5 days, no improvement in his leg swelling  atrial  fibrillation/flutter starting September 12 2015, then persistent flutter In the clinic his heart rate was 126 bpm Amiodarone dosing was increased, amlodipine was changed to diltiazem 120 mg daily, metoprolol increased up to 25 mill grams twice a day Converted to normal sinus rhythm at home  tachycardia concerning for atrial fibrillation 05/10/2015. Lasted only several minutes Rate possibly up to 140 bpm.  Previously has been very active, building a barn, tool shed, taking care of animals some benefits from the [place] for agent orange exposure and his cancer  admitted to the hospital on September 26 2011 for malaise, appearing pale, irregular heart rhythm and noted to be in atrial fibrillation. He was given medication for rhythm control and he converted to normal sinus rhythm later that evening. No longer on anticoagulation. He has been maintaining normal sinus rhythm No symptoms concerning for atrial fibrillation since that time  Echocardiogram showed ejection fraction 35-45%, mildly elevated right ventricular systolic pressures estimated at 30-40 mm mercury.  Previous catheterization showing occluded LAD in the mid vessel, 60% left main, 99% distal RCA who was sent to [facility] in October 2010 for bypass surgery. He received 3 vessel bypass by Dr.Gerhardt with a LIMA to the LAD, vein graft to the OM1 and vein graft to the PDA, with subsequent cardiac catheter March 2011 for chest discomfort showing occluded vein grafts x2 with patent LIMA. He has collateral vessels from left to right. Ejection fraction 55% mild anterolateral hypokinesis.  PMH:   has a past medical history of Anxiety, Arthritis,  Atrial fibrillation ([place]), Chicken pox, Chronic lymphocytic leukemia ([place]), Complication of anesthesia, COPD (chronic obstructive pulmonary disease) ([place]), Coronary artery disease, Dysrhythmia, GERD (gastroesophageal reflux disease), History of chemotherapy (2015-2016), HOH (hard of hearing), Hypertension,  Myocardial infarction ([place]) (2010), OSA on CPAP, Presence of permanent cardiac pacemaker (2017), PTSD (post-traumatic stress disorder), PTSD (post-traumatic stress disorder), Pure hypercholesterolemia, and Rheumatic fever (1959).  PSH:    Past Surgical History:  Procedure Laterality Date  . ABDOMINAL HERNIA REPAIR    . APPENDECTOMY  06/21/1985  . CARDIAC CATHETERIZATION  2010; 2011   ; Dr Fletcher Anon  . CORONARY ARTERY BYPASS GRAFT  04/2009   "CABG X3"  . EP IMPLANTABLE DEVICE N/A 03/03/2016   Procedure: Pacemaker Implant;  Surgeon: Deboraha Sprang, MD;  Location: [place] [facility];  Service: Cardiovascular;  Laterality: N/A;  . FOREIGN BODY REMOVAL  1968   "shrapnel in my tailbone"  . INGUINAL HERNIA REPAIR Right   . INSERT / REPLACE / REMOVE PACEMAKER    . JOINT REPLACEMENT Right 2018  . LAPAROSCOPIC CHOLECYSTECTOMY    . TONSILLECTOMY AND ADENOIDECTOMY  1956  . TOTAL HIP ARTHROPLASTY Right 10/22/2016   Procedure: TOTAL HIP ARTHROPLASTY;  Surgeon: Dereck Leep, MD;  Location: [facility];  Service: Orthopedics;  Laterality: Right;  . TOTAL HIP ARTHROPLASTY Left 11/04/2017   Procedure: TOTAL HIP ARTHROPLASTY;  Surgeon: Dereck Leep, MD;  Location: [facility] OR[facility];  Service: Orthopedics;  Laterality: Left;    Current Outpatient Medications  Medication Sig Dispense Refill  . acetaminophen (TYLENOL) 500 MG tablet Take 1,000 mg by mouth every 8 (eight) hours as needed for mild pain.    Marland Kitchen albuterol (PROVENTIL HFA;VENTOLIN HFA) 108 (90 Base) MCG/ACT inhaler Inhale 2 puffs into the lungs every 6 (six) hours as needed for wheezing or shortness of breath. 1 Inhaler 11  . amiodarone (PACERONE) 200 MG tablet TAKE 1/2 (ONE-HALF) TABLET BY MOUTH TWICE DAILY 90 tablet 0  . aspirin 81 MG tablet Take 81 mg by mouth daily.    Marland Kitchen atorvastatin (LIPITOR) 80 MG tablet Take 1 tablet (80 mg total) by mouth at bedtime. 90 tablet 3  . budesonide-formoterol (SYMBICORT) 160-4.5 MCG/ACT inhaler Inhale 2 puffs into the lungs 2  (two) times daily. 1 Inhaler 11  . cetirizine (ZYRTEC) 10 MG tablet Take 10 mg by mouth daily as needed for allergies.     . Coenzyme Q10 (COQ10) 200 MG CAPS Take 200 mg by mouth daily.    Marland Kitchen ezetimibe (ZETIA) 10 MG tablet Take 1 tablet (10 mg total) by mouth daily. 90 tablet 3  . Ibrutinib 420 MG TABS Take 420 mg by mouth daily. 28 tablet 3  . Krill Oil 350 MG CAPS Take 350 mg by mouth every evening.    . metoprolol tartrate (LOPRESSOR) 25 MG tablet Take 25 mg by mouth 2 (two) times daily.    . mirtazapine (REMERON) 15 MG tablet Take 15 mg by mouth at bedtime as needed (for panic associated with PTSD).     . Multiple Vitamin (MULTIVITAMIN WITH MINERALS) TABS tablet Take 1 tablet by mouth daily.    . mupirocin ointment (BACTROBAN) 2 % Apply 1 application topically 3 (three) times daily. 30 g 1  . nitroGLYCERIN (NITROSTAT) 0.4 MG SL tablet Place 1 tablet (0.4 mg total) under the tongue every 5 (five) minutes as needed for chest pain. 25 tablet 6  . tiotropium (SPIRIVA) 18 MCG inhalation capsule Place 1 capsule (18 mcg total) into inhaler and inhale at bedtime.  30 capsule 11  . traMADol (ULTRAM) 50 MG tablet TAKE 1 TABLET BY MOUTH EVERY 12 HOURS AS NEEDED FOR MODERATE PAIN 90 tablet 0   No current facility-administered medications for this visit.      Allergies:   Patient has no known allergies.   Social History:  The patient  reports that he quit smoking about 11 years ago. His smoking use included cigarettes. He has a 40.00 pack-year smoking history. He has never used smokeless tobacco. He reports that he drinks about 1.0 standard drinks of alcohol per week. He reports that he does not use drugs.   Family History:   family history includes Coronary artery disease in his unknown relative; Heart attack in his mother; Heart disease in his mother.    Review of Systems: Review of Systems  Constitutional: Negative.   Respiratory: Negative.   Cardiovascular: Negative.   Gastrointestinal:  Negative.   Musculoskeletal: Negative.        Periodic leg pain  Neurological: Negative.   Psychiatric/Behavioral: Negative.   All other systems reviewed and are negative.    PHYSICAL EXAM: VS:  BP (!) 150/80 (BP Location: Left Arm, Patient Position: Sitting, Cuff Size: Normal)   Pulse 69   Ht 5\' 9"  (1.753 m)   Wt 213 lb 8 oz (96.8 kg)   BMI 31.53 kg/m  , BMI Body mass index is 31.53 kg/m. Constitutional:  oriented to person, place, and time. No distress.  HENT:  Head: Grossly normal Eyes:  no discharge. No scleral icterus.  Neck: No JVD, no carotid bruits  Cardiovascular: Regular rate and rhythm, no murmurs appreciated Pulmonary/Chest: Clear to auscultation bilaterally, no wheezes or rails Abdominal: Soft.  no distension.  no tenderness.  Musculoskeletal: Normal range of motion Neurological:  normal muscle tone. Coordination normal. No atrophy Skin: Skin warm and dry Psychiatric: normal affect, pleasant   Recent Labs: 02/18/2018: TSH 1.058 05/27/2018: ALT 23; BUN 17; Creatinine, Ser 0.87; Hemoglobin 10.6; Platelets 134; Potassium 4.4; Sodium 141    Lipid Panel Lab Results  Component Value Date   CHOL 113 05/18/2017   HDL 47 05/18/2017   LDLCALC 53 05/18/2017   TRIG 64 05/18/2017      Wt Readings from Last 3 Encounters:  06/03/18 213 lb 8 oz (96.8 kg)  05/12/18 210 lb 12.8 oz (95.6 kg)  04/29/18 210 lb (95.3 kg)     ASSESSMENT AND PLAN: Atrial fibrillation, unspecified type (HCC)  Maintaining normal sinus rhythm, We'll continue amiodarone, metoprolol  Anticoagulation held for significant hematuria Ideally needs urology follow-up  HYPERCHOLESTEROLEMIA Lipitor80 mg daily, zetia daily Goal LDL less than 70 Denies having significant myalgias at this time  Essential hypertension Blood pressure elevated in the morning but low in the afternoon Compliant with metoprolol twice a day No medication changes at this time Possibly exacerbated by PTSD,  nightmares  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 EKG 12-Lead     Signed,  Esmond Plants, M.D., Ph.D. 06/03/2018  [place], [place]

## 2018-06-02 ENCOUNTER — Telehealth: Payer: Self-pay | Admitting: Pharmacist

## 2018-06-02 NOTE — Telephone Encounter (Signed)
Oral Chemotherapy Pharmacist Encounter  Follow-Up Form  Called patient today to follow up regarding patient's oral chemotherapy medication: Imbruvica (ibrutinib)  Original Start date of oral chemotherapy: 03/2018  Pt reports 0 tablets/doses of ibrutinib missed so far this month.   Pt reports the following side effects: None reported  Recent labs reviewed: CBC from 05/27/18  New medications?: None reported  Other Issues: N/A  Patient knows to call the office with questions or concerns. Oral Oncology Clinic will continue to follow.  Darl Pikes, PharmD, BCPS, [facility] Hematology/Oncology Clinical Pharmacist [facility] [place] [facility] [phone]  06/02/2018 3:48 PM

## 2018-06-03 ENCOUNTER — Encounter: Payer: Self-pay | Admitting: Cardiovascular Disease

## 2018-06-03 ENCOUNTER — Inpatient Hospital Stay: Payer: Non-veteran care

## 2018-06-03 ENCOUNTER — Ambulatory Visit: Payer: Medicare HMO | Admitting: Cardiovascular Disease

## 2018-06-03 VITALS — BP 150/80 | HR 69 | Ht 69.0 in | Wt 213.5 lb

## 2018-06-03 DIAGNOSIS — I5032 Chronic diastolic (congestive) heart failure: Secondary | ICD-10-CM | POA: Diagnosis not present

## 2018-06-03 DIAGNOSIS — E782 Mixed hyperlipidemia: Secondary | ICD-10-CM

## 2018-06-03 DIAGNOSIS — I495 Sick sinus syndrome: Secondary | ICD-10-CM | POA: Diagnosis not present

## 2018-06-03 DIAGNOSIS — I10 Essential (primary) hypertension: Secondary | ICD-10-CM | POA: Diagnosis not present

## 2018-06-03 DIAGNOSIS — I480 Paroxysmal atrial fibrillation: Secondary | ICD-10-CM | POA: Diagnosis not present

## 2018-06-03 DIAGNOSIS — I739 Peripheral vascular disease, unspecified: Secondary | ICD-10-CM | POA: Diagnosis not present

## 2018-06-03 NOTE — Patient Instructions (Signed)

## 2018-06-08 MED FILL — IMBRUVICA 420 MG TAB: 420 | 28 days supply | Qty: 28 | Fill #3

## 2018-06-23 ENCOUNTER — Other Ambulatory Visit: Payer: Self-pay | Admitting: *Deleted

## 2018-06-23 DIAGNOSIS — C9110 Chronic lymphocytic leukemia of B-cell type not having achieved remission: Secondary | ICD-10-CM

## 2018-06-24 ENCOUNTER — Inpatient Hospital Stay (HOSPITAL_BASED_OUTPATIENT_CLINIC_OR_DEPARTMENT_OTHER): Payer: Medicare HMO | Admitting: Internal Medicine

## 2018-06-24 ENCOUNTER — Inpatient Hospital Stay: Payer: Medicare HMO | Attending: Internal Medicine

## 2018-06-24 VITALS — BP 164/85 | HR 64 | Resp 16 | Wt 210.6 lb

## 2018-06-24 DIAGNOSIS — I10 Essential (primary) hypertension: Secondary | ICD-10-CM | POA: Diagnosis not present

## 2018-06-24 DIAGNOSIS — M545 Low back pain: Secondary | ICD-10-CM

## 2018-06-24 DIAGNOSIS — Z7901 Long term (current) use of anticoagulants: Secondary | ICD-10-CM | POA: Insufficient documentation

## 2018-06-24 DIAGNOSIS — M2550 Pain in unspecified joint: Secondary | ICD-10-CM | POA: Insufficient documentation

## 2018-06-24 DIAGNOSIS — I4891 Unspecified atrial fibrillation: Secondary | ICD-10-CM | POA: Diagnosis not present

## 2018-06-24 DIAGNOSIS — G8929 Other chronic pain: Secondary | ICD-10-CM | POA: Insufficient documentation

## 2018-06-24 DIAGNOSIS — Z87891 Personal history of nicotine dependence: Secondary | ICD-10-CM | POA: Insufficient documentation

## 2018-06-24 DIAGNOSIS — C9110 Chronic lymphocytic leukemia of B-cell type not having achieved remission: Secondary | ICD-10-CM | POA: Insufficient documentation

## 2018-06-24 LAB — COMPREHENSIVE METABOLIC PANEL
ALT: 19 U/L (ref 0–44)
AST: 17 U/L (ref 15–41)
Albumin: 4.1 g/dL (ref 3.5–5.0)
Alkaline Phosphatase: 81 U/L (ref 38–126)
Anion gap: 8 (ref 5–15)
BUN: 18 mg/dL (ref 8–23)
CHLORIDE: 105 mmol/L (ref 98–111)
CO2: 28 mmol/L (ref 22–32)
CREATININE: 0.85 mg/dL (ref 0.61–1.24)
Calcium: 8.9 mg/dL (ref 8.9–10.3)
GFR calc non Af Amer: >60 mL/min (ref >60)
Glucose, Bld: 98 mg/dL (ref 70–99)
Potassium: 4.3 mmol/L (ref 3.5–5.1)
SODIUM: 141 mmol/L (ref 135–145)
Total Bilirubin: 1.2 mg/dL (ref 0.3–1.2)
Total Protein: 6.8 g/dL (ref 6.5–8.1)

## 2018-06-24 LAB — CBC WITH DIFFERENTIAL/PLATELET
ABS IMMATURE GRANULOCYTES: 0.28 10*3/uL — AB (ref 0.00–0.07)
BASOS ABS: 0.1 10*3/uL (ref 0.0–0.1)
Basophils Relative: 0 %
EOS PCT: 0 %
Eosinophils Absolute: 0.1 10*3/uL (ref 0.0–0.5)
HCT: 40.1 % (ref 39.0–52.0)
Hemoglobin: 11.6 g/dL — ABNORMAL LOW (ref 13.0–17.0)
IMMATURE GRANULOCYTES: 0 %
LYMPHS ABS: 234.2 10*3/uL — AB (ref 0.7–4.0)
LYMPHS PCT: 98 %
MCH: 30.4 pg (ref 26.0–34.0)
MCHC: 28.9 g/dL — ABNORMAL LOW (ref 30.0–36.0)
MCV: 105.2 fL — AB (ref 80.0–100.0)
Monocytes Absolute: 0.5 10*3/uL (ref 0.1–1.0)
Monocytes Relative: 0 %
NEUTROS ABS: 4.7 10*3/uL (ref 1.7–7.7)
NRBC: 0 % (ref 0.0–0.2)
Neutrophils Relative %: 2 %
PLATELETS: 125 10*3/uL — AB (ref 150–400)
RBC: 3.81 MIL/uL — AB (ref 4.22–5.81)
RDW: 16.8 % — AB (ref 11.5–15.5)
WBC: 239.9 10*3/uL — AB (ref 4.0–10.5)

## 2018-06-24 LAB — LACTATE DEHYDROGENASE: LDH: 156 U/L (ref 98–192)

## 2018-06-24 NOTE — Assessment & Plan Note (Addendum)
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 STABLE.  # On ibrutinib since beginning of September 2019.  Tolerating well clinical improvement noted continue ibrutinib. Will order CT scan at next visit/after the lymphocytosis improved. Currently spurious lymphocytosis sec to ibrutinib.   # A.fib-currently in sinus rhythm amiodarone- [Dr.Gollan].off eliquis; STABLE.   # Hypertension well- not well controlled in clinic; check at home/ bring a log; systolic 326-712W.   # DISPOSITION:  # follow up in 6 weeks/labs-cbc./cmp/LDH;Dr.B.

## 2018-06-24 NOTE — Progress Notes (Signed)
[place] OFFICE PROGRESS NOTE  Patient Care Team: Leone Haven, MD as PCP - General (Family Medicine) [name], MD as Consulting Physician (Cardiology) Cammie Sickle, MD as Medical Oncologist (Hematology and Oncology)  Cancer Staging No matching staging information was found for the patient.   Oncology History   # AUG 2015- SLL/CLL [Right Ax Ln Bx] s/p Benda-Rituxan x6 [finished March 2016]; Maintenance Rituxan q 79m[started April 2016; Dr.Pandit];Last Ritux Jan 2017.  MARCH 2017- CT N/C/A/P- NED. STOP Ritux; surveillance   # AUG 2019- CT/PET- progression/NO transformation   # s/p PPM [Dr.Klein; Sep 2017]; A.fib [on eliquis]  # MAY 2019- 65% OF NUCLEI POSITIVE FOR ATM DELETION; 53% OF NUCLEI POSITIVE FOR TP53 DELETION; IGVH- UN-MUTATED [poor prognosis]  DIAGNOSIS: CLL  STAGE: IV  ;GOALS: control  CURRENT/MOST RECENT THERAPY : Ibrutinib [ mid sep 2019]      CLL (chronic lymphocytic leukemia) [place]      INTERVAL HISTORY:  Michael [id number].[id number].  male CLL-high risk currently on ibrutinib is here for follow-up.   Denies any new lumps or bumps.  States his lumps have resolved.  Appetite is good.  Energy levels are good.  Weight is stable.  Continues to complain of easy bruising.  No falls.  No blood in urine.  Chronic joint pains back pain not any worse.   Review of Systems  Constitutional: Negative for chills, diaphoresis and fever.  HENT: Negative for nosebleeds and sore throat.   Eyes: Negative for double vision.  Respiratory: Negative for cough, hemoptysis, sputum production, shortness of breath and wheezing.   Cardiovascular: Negative for chest pain, palpitations, orthopnea and leg swelling.  Gastrointestinal: Negative for abdominal pain, blood in stool, constipation, diarrhea, heartburn, melena, nausea and vomiting.  Genitourinary: Negative for dysuria, frequency and urgency.  Musculoskeletal: Positive for back pain and joint  pain.  Skin: Negative.  Negative for itching and rash.  Neurological: Negative for dizziness, tingling, focal weakness, weakness and headaches.  Endo/Heme/Allergies: Bruises/bleeds easily.  Psychiatric/Behavioral: Negative for depression. The patient is not nervous/anxious and does not have insomnia.       PAST MEDICAL HISTORY :  Past Medical History:  Diagnosis Date  . Anxiety   . Arthritis   . [place]rial fibrillation ([place]    a. Dx 2013, recurred 02/2014, CHA2DS2VASc = 3 -->placed on Eliquis;  b. 02/2014 Echo: EF 50-55%, mid and apical anterior septum and mid and apical inf septum are abnl, mild to mod Ao sclerosis w/o AS.  .Marland KitchenChicken pox   . Chronic lymphocytic leukemia ([place]    a. Dx 02/2014.  .Marland KitchenComplication of anesthesia    History of  PTSD--do not touch patient when waking up from surgery.  .Marland KitchenCOPD (chronic obstructive pulmonary disease) ([place]   . Coronary artery disease    a. 04/2009 CABG x 3 (LIMA->LAD, VG->OM1, VG->PDA);  b. 09/2009 Cath: occluded VG x 2 w/ patent LIMA and L->R collats. EF 55%, mild antlat HK;  c. 10/2011 MV: EF 53%, no isch/infarct-->low risk.  .Marland KitchenDysrhythmia    hx of a-fib  . GERD (gastroesophageal reflux disease)    occasional  . History of chemotherapy 2015-2016  . HOH (hard of hearing)    Bilateral Hearing Aids  . Hypertension   . [place]ocardial infarction ([place] 2010  . OSA on CPAP    USE C-PAP  . Presence of permanent cardiac pacemaker 2017  . PTSD (post-traumatic stress disorder)   . PTSD (post-traumatic stress disorder)   .  Pure hypercholesterolemia   . Rheumatic fever 1959    PAST SURGICAL HISTORY :   Past Surgical History:  Procedure Laterality Date  . ABDOMINAL HERNIA REPAIR    . APPENDECTOMY  06/21/1985  . CARDIAC CATHETERIZATION  2010; 2011   ; Dr Fletcher Anon  . CORONARY ARTERY BYPASS GRAFT  04/2009   "CABG X3"  . EP IMPLANTABLE DEVICE N/A 03/03/2016   Procedure: Pacemaker Implant;  Surgeon: Deboraha Sprang, MD;  Location: [facility];   Service: Cardiovascular;  Laterality: N/A;  . FOREIGN BODY REMOVAL  1968   "shrapnel in my tailbone"  . INGUINAL HERNIA REPAIR Right   . INSERT / REPLACE / REMOVE PACEMAKER    . JOINT REPLACEMENT Right 2018  . LAPAROSCOPIC CHOLECYSTECTOMY    . TONSILLECTOMY AND ADENOIDECTOMY  1956  . TOTAL HIP ARTHROPLASTY Right 10/22/2016   Procedure: TOTAL HIP ARTHROPLASTY;  Surgeon: Dereck Leep, MD;  Location: [facility];  Service: Orthopedics;  Laterality: Right;  . TOTAL HIP ARTHROPLASTY Left 11/04/2017   Procedure: TOTAL HIP ARTHROPLASTY;  Surgeon: Dereck Leep, MD;  Location: [facility];  Service: Orthopedics;  Laterality: Left;    FAMILY HISTORY :   Family History  Problem Relation Age of Onset  . Heart disease Mother   . Heart attack Mother   . Coronary artery disease Unknown        family history    SOCIAL HISTORY:   Social History   Tobacco Use  . Smoking status: Former Smoker    Packs/day: 1.00    Years: 40.00    Pack years: 40.00    Types: Cigarettes    Last attempt to quit: 07/21/2006    Years since quitting: 11.9  . Smokeless tobacco: Never Used  Substance Use Topics  . Alcohol use: Yes    Alcohol/week: 1.0 standard drinks    Types: 1 Standard drinks or equivalent per week    Comment: rarely  . Drug use: No    ALLERGIES:  has No Known Allergies.  MEDICATIONS:  Current Outpatient Medications  Medication Sig Dispense Refill  . acetaminophen (TYLENOL) 500 MG tablet Take 1,000 mg by mouth every 8 (eight) hours as needed for mild pain.    Marland Kitchen albuterol (PROVENTIL HFA;VENTOLIN HFA) 108 (90 Base) MCG/ACT inhaler Inhale 2 puffs into the lungs every 6 (six) hours as needed for wheezing or shortness of breath. 1 Inhaler 11  . amiodarone (PACERONE) 200 MG tablet TAKE 1/2 (ONE-HALF) TABLET BY MOUTH TWICE DAILY 90 tablet 0  . aspirin 81 MG tablet Take 81 mg by mouth daily.    Marland Kitchen atorvastatin (LIPITOR) 80 MG tablet Take 1 tablet (80 mg total) by mouth at bedtime. 90 tablet 3  .  budesonide-formoterol (SYMBICORT) 160-4.5 MCG/ACT inhaler Inhale 2 puffs into the lungs 2 (two) times daily. 1 Inhaler 11  . cetirizine (ZYRTEC) 10 MG tablet Take 10 mg by mouth daily as needed for allergies.     . Coenzyme Q10 (COQ10) 200 MG CAPS Take 200 mg by mouth daily.    Marland Kitchen ezetimibe (ZETIA) 10 MG tablet Take 1 tablet (10 mg total) by mouth daily. 90 tablet 3  . Ibrutinib 420 MG TABS Take 420 mg by mouth daily. 28 tablet 3  . Krill Oil 350 MG CAPS Take 350 mg by mouth every evening.    . metoprolol tartrate (LOPRESSOR) 25 MG tablet Take 25 mg by mouth 2 (two) times daily.    . mirtazapine (REMERON) 15 MG tablet Take  15 mg by mouth at bedtime as needed (for panic associated with PTSD).     . Multiple Vitamin (MULTIVITAMIN WITH MINERALS) TABS tablet Take 1 tablet by mouth daily.    . mupirocin ointment (BACTROBAN) 2 % Apply 1 application topically 3 (three) times daily. 30 g 1  . nitroGLYCERIN (NITROSTAT) 0.4 MG SL tablet Place 1 tablet (0.4 mg total) under the tongue every 5 (five) minutes as needed for chest pain. 25 tablet 6  . tiotropium (SPIRIVA) 18 MCG inhalation capsule Place 1 capsule (18 mcg total) into inhaler and inhale at bedtime. 30 capsule 11  . traMADol (ULTRAM) 50 MG tablet TAKE 1 TABLET BY MOUTH EVERY 12 HOURS AS NEEDED FOR MODERATE PAIN 90 tablet 0   No current facility-administered medications for this visit.     PHYSICAL EXAMINATION: ECOG PERFORMANCE STATUS: 1 - Symptomatic but completely ambulatory  BP (!) 164/85 (BP Location: Left Arm, Patient Position: Sitting)   Pulse 64   Resp 16   Wt 210 lb 9.6 oz (95.5 kg)   BMI 31.10 kg/m   Filed Weights   06/24/18 1109  Weight: 210 lb 9.6 oz (95.5 kg)    Physical Exam  Constitutional: He is oriented to person, place, and time and well-developed, well-nourished, and in no distress.    Accompanied by his daughter.  HENT:  Head: Normocephalic and atraumatic.  Mouth/Throat: Oropharynx is clear and moist. No  oropharyngeal exudate.  Eyes: Pupils are equal, round, and reactive to light.  Neck: Normal range of motion. Neck supple.  Cardiovascular: Normal rate and regular rhythm.  Pulmonary/Chest: No respiratory distress. He has no wheezes.  Abdominal: Soft. Bowel sounds are normal. He exhibits no distension and no mass. There is no tenderness. There is no rebound and no guarding.  Musculoskeletal: Normal range of motion. He exhibits no edema or tenderness.  Lymphadenopathy:  Resolved lymph nodes in the neck underarms.  Neurological: He is alert and oriented to person, place, and time.  Skin: Skin is warm.  Psychiatric: Affect normal.       LABORATORY DATA:  I have reviewed the data as listed    Component Value Date/Time   NA 141 06/24/2018 1006   NA 139 10/11/2014 1800   K 4.3 06/24/2018 1006   K 3.3 (L) 10/11/2014 1800   CL 105 06/24/2018 1006   CL 106 10/11/2014 1800   CO2 28 06/24/2018 1006   CO2 27 10/11/2014 1800   GLUCOSE 98 06/24/2018 1006   GLUCOSE 107 (H) 10/11/2014 1800   BUN 18 06/24/2018 1006   BUN 15 10/11/2014 1800   CREATININE 0.85 06/24/2018 1006   CREATININE 0.89 10/11/2014 1800   CALCIUM 8.9 06/24/2018 1006   CALCIUM 8.8 (L) 10/11/2014 1800   PROT 6.8 06/24/2018 1006   PROT 6.7 05/18/2017 1048   PROT 6.4 (L) 10/11/2014 1800   ALBUMIN 4.1 06/24/2018 1006   ALBUMIN 4.3 05/18/2017 1048   ALBUMIN 4.1 10/11/2014 1800   AST 17 06/24/2018 1006   AST 23 10/11/2014 1800   ALT 19 06/24/2018 1006   ALT 22 10/11/2014 1800   ALKPHOS 81 06/24/2018 1006   ALKPHOS 61 10/11/2014 1800   BILITOT 1.2 06/24/2018 1006   BILITOT 0.7 05/18/2017 1048   BILITOT 0.9 10/11/2014 1800   GFRNONAA >60 06/24/2018 1006   GFRNONAA >60 10/11/2014 1800   GFRAA >60 06/24/2018 1006   GFRAA >60 10/11/2014 1800    No results found for: SPEP, UPEP  Lab Results  Component Value Date  WBC 239.9 (HH) 06/24/2018   NEUTROABS 4.7 06/24/2018   HGB 11.6 (L) 06/24/2018   HCT 40.1 06/24/2018    MCV 105.2 (H) 06/24/2018   PLT 125 (L) 06/24/2018      Chemistry      Component Value Date/Time   NA 141 06/24/2018 1006   NA 139 10/11/2014 1800   K 4.3 06/24/2018 1006   K 3.3 (L) 10/11/2014 1800   CL 105 06/24/2018 1006   CL 106 10/11/2014 1800   CO2 28 06/24/2018 1006   CO2 27 10/11/2014 1800   BUN 18 06/24/2018 1006   BUN 15 10/11/2014 1800   CREATININE 0.85 06/24/2018 1006   CREATININE 0.89 10/11/2014 1800      Component Value Date/Time   CALCIUM 8.9 06/24/2018 1006   CALCIUM 8.8 (L) 10/11/2014 1800   ALKPHOS 81 06/24/2018 1006   ALKPHOS 61 10/11/2014 1800   AST 17 06/24/2018 1006   AST 23 10/11/2014 1800   ALT 19 06/24/2018 1006   ALT 22 10/11/2014 1800   BILITOT 1.2 06/24/2018 1006   BILITOT 0.7 05/18/2017 1048   BILITOT 0.9 10/11/2014 1800       RADIOGRAPHIC STUDIES: I have personally reviewed the radiological images as listed and agreed with the findings in the report. No results found.   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 STABLE.  # On ibrutinib since beginning of September 2019.  Tolerating well clinical improvement noted continue ibrutinib. Will order CT scan at next visit/after the lymphocytosis improved. Currently spurious lymphocytosis sec to ibrutinib.   # A.fib-currently in sinus rhythm amiodarone- [Dr.Gollan].off eliquis; STABLE.   # Hypertension well- not well controlled in clinic; check at home/ bring a log; systolic 947-076J.   # DISPOSITION:  # follow up in 6 weeks/labs-cbc./cmp/LDH;Dr.B.      No orders of the defined types were placed in this encounter.  All questions were answered. The patient knows to call the clinic with any problems, questions or concerns.      Cammie Sickle,  MD 06/24/2018 11:37 AM

## 2018-06-30 ENCOUNTER — Other Ambulatory Visit: Payer: Self-pay | Admitting: Internal Medicine

## 2018-06-30 DIAGNOSIS — C9110 Chronic lymphocytic leukemia of B-cell type not having achieved remission: Secondary | ICD-10-CM

## 2018-06-30 NOTE — Telephone Encounter (Signed)
)     Ref Range & Units 6d ago  WBC 4.0 - 10.5 K/uL 239.9High Panic    RBC 4.22 - 5.81 MIL/uL 3.81Low    Hemoglobin 13.0 - 17.0 g/dL 11.6Low    HCT 39.0 - 52.0 % 40.1   MCV 80.0 - 100.0 fL 105.2High    MCH 26.0 - 34.0 pg 30.4   MCHC 30.0 - 36.0 g/dL 28.9Low    RDW 11.5 - 15.5 % 16.8High    Platelets 150 - 400 K/uL 125Low    nRBC 0.0 - 0.2 % 0.0   Neutrophils Relative % % 2   Neutro Abs 1.7 - 7.7 K/uL 4.7   Lymphocytes Relative % 98   Lymphs Abs 0.7 - 4.0 K/uL 234.2High    Monocytes Relative % 0   Monocytes Absolute 0.1 - 1.0 K/uL 0.5   Eosinophils Relative % 0   Eosinophils Absolute 0.0 - 0.5 K/uL 0.1   Basophils Relative % 0   Basophils Absolute 0.0 - 0.1 K/uL 0.1   WBC Morphology  CONSISTANT WITH CLL   Smear Review  SMEAR SCANNED   Immature Granulocytes % 0   Abs Immature Granulocytes 0.00 - 0.07 K/uL 0.28High    Comment: Performed at [facility], [address] [place], [place] [address]  Resulting Agency  [facility]      Specimen Collected: 06/24/18 10:06  Last Resulted: 06/24/18 10:58

## 2018-07-05 MED FILL — IMBRUVICA 420 MG TAB: 420 | 28 days supply | Qty: 28 | Fill #0

## 2018-07-13 DIAGNOSIS — G4733 Obstructive sleep apnea (adult) (pediatric): Secondary | ICD-10-CM | POA: Diagnosis not present

## 2018-07-18 LAB — CUP PACEART REMOTE DEVICE CHECK
Brady Statistic AP VP Percent: 0.46 %
Brady Statistic AS VP Percent: 0 %
Brady Statistic AS VS Percent: 0.31 %
Brady Statistic RA Percent Paced: 99.68 %
Brady Statistic RV Percent Paced: 0.49 %
Implantable Lead Implant Date: 20170814
Implantable Lead Location: 753859
Implantable Lead Model: 5076
Implantable Lead Model: 5076
Lead Channel Impedance Value: 323 Ohm
Lead Channel Impedance Value: 437 Ohm
Lead Channel Impedance Value: 532 Ohm
Lead Channel Pacing Threshold Amplitude: 0.75 V
Lead Channel Pacing Threshold Pulse Width: 0.4 ms
Lead Channel Pacing Threshold Pulse Width: 0.4 ms
Lead Channel Sensing Intrinsic Amplitude: 21.75 mV
Lead Channel Sensing Intrinsic Amplitude: 3 mV
Lead Channel Setting Pacing Amplitude: 2 V
Lead Channel Setting Pacing Pulse Width: 0.4 ms
Lead Channel Setting Sensing Sensitivity: 0.9 mV
MDC IDC LEAD IMPLANT DT: 20170814
MDC IDC LEAD LOCATION: 753860
MDC IDC MSMT BATTERY REMAINING LONGEVITY: 91 mo
MDC IDC MSMT BATTERY VOLTAGE: 3.01 V
MDC IDC MSMT LEADCHNL RA IMPEDANCE VALUE: 418 Ohm
MDC IDC MSMT LEADCHNL RA PACING THRESHOLD AMPLITUDE: 0.875 V
MDC IDC MSMT LEADCHNL RA SENSING INTR AMPL: 3 mV
MDC IDC MSMT LEADCHNL RV SENSING INTR AMPL: 21.75 mV
MDC IDC PG IMPLANT DT: 20170814
MDC IDC SESS DTM: 20191029140640
MDC IDC SET LEADCHNL RV PACING AMPLITUDE: 2.5 V
MDC IDC STAT BRADY AP VS PERCENT: 99.22 %

## 2018-07-20 ENCOUNTER — Other Ambulatory Visit: Payer: Self-pay

## 2018-07-20 MED ORDER — AMIODARONE HCL 200 MG PO TABS: ORAL_TABLET | ORAL | 0 refills | Status: DC

## 2018-08-02 MED FILL — IMBRUVICA 420 MG TAB: 420 | 28 days supply | Qty: 28 | Fill #1

## 2018-08-04 ENCOUNTER — Other Ambulatory Visit: Payer: Self-pay | Admitting: Internal Medicine

## 2018-08-04 DIAGNOSIS — C9110 Chronic lymphocytic leukemia of B-cell type not having achieved remission: Secondary | ICD-10-CM

## 2018-08-05 ENCOUNTER — Inpatient Hospital Stay (HOSPITAL_BASED_OUTPATIENT_CLINIC_OR_DEPARTMENT_OTHER): Payer: Medicare HMO | Admitting: Internal Medicine

## 2018-08-05 ENCOUNTER — Encounter: Payer: Self-pay | Admitting: Internal Medicine

## 2018-08-05 ENCOUNTER — Inpatient Hospital Stay: Payer: Medicare HMO | Attending: Internal Medicine

## 2018-08-05 ENCOUNTER — Other Ambulatory Visit: Payer: Self-pay

## 2018-08-05 VITALS — BP 161/75 | HR 60 | Temp 98.6°F | Resp 16 | Ht 69.0 in | Wt 210.9 lb

## 2018-08-05 DIAGNOSIS — C9110 Chronic lymphocytic leukemia of B-cell type not having achieved remission: Secondary | ICD-10-CM

## 2018-08-05 DIAGNOSIS — M1990 Unspecified osteoarthritis, unspecified site: Secondary | ICD-10-CM | POA: Diagnosis not present

## 2018-08-05 DIAGNOSIS — Z87891 Personal history of nicotine dependence: Secondary | ICD-10-CM

## 2018-08-05 DIAGNOSIS — I10 Essential (primary) hypertension: Secondary | ICD-10-CM

## 2018-08-05 DIAGNOSIS — R269 Unspecified abnormalities of gait and mobility: Secondary | ICD-10-CM

## 2018-08-05 DIAGNOSIS — I4891 Unspecified atrial fibrillation: Secondary | ICD-10-CM

## 2018-08-05 DIAGNOSIS — G8929 Other chronic pain: Secondary | ICD-10-CM | POA: Diagnosis not present

## 2018-08-05 DIAGNOSIS — M2550 Pain in unspecified joint: Secondary | ICD-10-CM | POA: Diagnosis not present

## 2018-08-05 LAB — COMPREHENSIVE METABOLIC PANEL
ALT: 22 U/L (ref 0–44)
AST: 20 U/L (ref 15–41)
Albumin: 3.9 g/dL (ref 3.5–5.0)
Alkaline Phosphatase: 91 U/L (ref 38–126)
Anion gap: 8 (ref 5–15)
BUN: 20 mg/dL (ref 8–23)
CO2: 26 mmol/L (ref 22–32)
CREATININE: 0.96 mg/dL (ref 0.61–1.24)
Calcium: 8.7 mg/dL — ABNORMAL LOW (ref 8.9–10.3)
Chloride: 108 mmol/L (ref 98–111)
GFR calc Af Amer: >60 mL/min (ref >60)
Glucose, Bld: 114 mg/dL — ABNORMAL HIGH (ref 70–99)
Potassium: 3.9 mmol/L (ref 3.5–5.1)
Sodium: 142 mmol/L (ref 135–145)
Total Bilirubin: 1.2 mg/dL (ref 0.3–1.2)
Total Protein: 6.8 g/dL (ref 6.5–8.1)

## 2018-08-05 LAB — CBC WITH DIFFERENTIAL/PLATELET
Abs Immature Granulocytes: 0.3 10*3/uL — ABNORMAL HIGH (ref 0.00–0.07)
Basophils Absolute: 0.1 10*3/uL (ref 0.0–0.1)
Basophils Relative: 0 %
EOS ABS: 0.1 10*3/uL (ref 0.0–0.5)
EOS PCT: 0 %
HCT: 40.1 % (ref 39.0–52.0)
Hemoglobin: 11.8 g/dL — ABNORMAL LOW (ref 13.0–17.0)
Immature Granulocytes: 0 %
Lymphocytes Relative: 97 %
Lymphs Abs: 185.2 10*3/uL — ABNORMAL HIGH (ref 0.7–4.0)
MCH: 30.1 pg (ref 26.0–34.0)
MCHC: 29.4 g/dL — ABNORMAL LOW (ref 30.0–36.0)
MCV: 102.3 fL — ABNORMAL HIGH (ref 80.0–100.0)
MONO ABS: 0.7 10*3/uL (ref 0.1–1.0)
Monocytes Relative: 0 %
Neutro Abs: 6.6 10*3/uL (ref 1.7–7.7)
Neutrophils Relative %: 3 %
Platelets: 157 10*3/uL (ref 150–400)
RBC: 3.92 MIL/uL — ABNORMAL LOW (ref 4.22–5.81)
RDW: 16.6 % — ABNORMAL HIGH (ref 11.5–15.5)
WBC: 192.9 10*3/uL (ref 4.0–10.5)
nRBC: 0 % (ref 0.0–0.2)

## 2018-08-05 LAB — LACTATE DEHYDROGENASE: LDH: 144 U/L (ref 98–192)

## 2018-08-05 NOTE — Assessment & Plan Note (Addendum)
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  Currently on ibrutinib.  #Clinically improving.  Continue ibrutinib.  Discussed white count is 222 likely spuriously.  We will plan to get a CT scan the next 1 to 2 months.  #Gait instability/arthritis risk of fall-discussed physical therapy patient reluctantly agrees.  # A.fib-on amiodarone sinus rhythm; off Eliquis.  Continue aspirin.  # Hypertension-systolic [id number].  Reviewed the log.  Monitor for now.  # DISPOSITION:  # Recommend PT Dx: gait instability # follow up in 6 weeks/labs-cbc./cmp/LDH;Dr.B.

## 2018-08-05 NOTE — Progress Notes (Signed)
[place] OFFICE PROGRESS NOTE  Patient Care Team: Leone Haven, MD as PCP - General (Family Medicine) [name], MD as Consulting Physician (Cardiology) Cammie Sickle, MD as Medical Oncologist (Hematology and Oncology)  Cancer Staging No matching staging information was found for the patient.   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  MARCH 2017- CT N/C/A/P- NED. STOP Ritux; surveillance   # AUG 2019- CT/PET- progression/NO transformation;  # NOV 2019- Progression; started Ibrutinib 420 mg/d.    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; IGVH- UN-MUTATED [poor prognosis]  DIAGNOSIS: CLL  STAGE: IV  ;GOALS: control  CURRENT/MOST RECENT THERAPY : Ibrutinib [ mid sep 2019]      CLL (ch[place]ocytic leukemia) ([place]      INTERVAL HISTORY:  Michael LIGHTNER89y.o.  male CLL-high risk currently on ibrutinib is here for follow-up.   Patient appetite is good.  Is gaining weight.  No nausea no vomiting.  Denies any blood in stools or black or stools.  Has chronic joint pains not any worse.  He complains of gait instability/attributed to arthritis.  Walks with a cane.  No falls.  Review of Systems  Constitutional: Negative for chills, diaphoresis and fever.  HENT: Negative for nosebleeds and sore throat.   Eyes: Negative for double vision.  Respiratory: Negative for cough, hemoptysis, sputum production, shortness of breath and wheezing.   Cardiovascular: Negative for chest pain, palpitations, orthopnea and leg swelling.  Gastrointestinal: Negative for abdominal pain, blood in stool, constipation, diarrhea, heartburn, melena, nausea and  vomiting.  Genitourinary: Negative for dysuria, frequency and urgency.  Musculoskeletal: Positive for back pain and joint pain.  Skin: Negative.  Negative for itching and rash.  Neurological: Negative for dizziness, tingling, focal weakness, weakness and headaches.  Psychiatric/Behavioral: Negative for depression. The patient is not nervous/anxious and does not have insomnia.       PAST MEDICAL HISTORY :  Past Medical History:  Diagnosis Date  . Anxiety   . Arthritis   . [place]rial fibrillation ([place]    a. Dx 2013, recurred 02/2014, CHA2DS2VASc = 3 -->placed on Eliquis;  b. 02/2014 Echo: EF 50-55%, mid and apical anterior septum and mid and apical inf septum are abnl, mild to mod Ao sclerosis w/o AS.  .Marland KitchenChicken pox   . Chronic lymphocytic leukemia ([place]    a. Dx 02/2014.  .Marland KitchenComplication of anesthesia    History of  PTSD--do not touch patient when waking up from surgery.  .Marland KitchenCOPD (chronic obstructive pulmonary disease) ([place]   . Coronary artery disease    a. 04/2009 CABG x 3 (LIMA->LAD, VG->OM1, VG->PDA);  b. 09/2009 Cath: occluded VG x 2 w/ patent LIMA and L->R collats. EF 55%, mild antlat HK;  c. 10/2011 MV: EF 53%, no isch/infarct-->low risk.  .Marland KitchenDysrhythmia    hx of a-fib  . GERD (gastroesophageal reflux disease)    occasional  . History of chemotherapy 2015-2016  . HOH (hard of hearing)    Bilateral Hearing Aids  . Hypertension   . [place]ocardial infarction ([place]  . OSA on CPAP    USE C-PAP  . Presence of permanent cardiac pacemaker 2017  .  PTSD (post-traumatic stress disorder)   . PTSD (post-traumatic stress disorder)   . Pure hypercholesterolemia   . Rheumatic fever 1959    PAST SURGICAL HISTORY :   Past Surgical History:  Procedure Laterality Date  . ABDOMINAL HERNIA REPAIR    . APPENDECTOMY  06/21/1985  . CARDIAC CATHETERIZATION  2010; 2011   ; Dr Fletcher Anon  . CORONARY ARTERY BYPASS GRAFT  04/2009   "CABG X3"  . EP IMPLANTABLE DEVICE N/A 03/03/2016   Procedure: Pacemaker  Implant;  Surgeon: Deboraha Sprang, MD;  Location: [facility];  Service: Cardiovascular;  Laterality: N/A;  . FOREIGN BODY REMOVAL  1968   "shrapnel in my tailbone"  . INGUINAL HERNIA REPAIR Right   . INSERT / REPLACE / REMOVE PACEMAKER    . JOINT REPLACEMENT Right 2018  . LAPAROSCOPIC CHOLECYSTECTOMY    . TONSILLECTOMY AND ADENOIDECTOMY  1956  . TOTAL HIP ARTHROPLASTY Right 10/22/2016   Procedure: TOTAL HIP ARTHROPLASTY;  Surgeon: Dereck Leep, MD;  Location: [facility];  Service: Orthopedics;  Laterality: Right;  . TOTAL HIP ARTHROPLASTY Left 11/04/2017   Procedure: TOTAL HIP ARTHROPLASTY;  Surgeon: Dereck Leep, MD;  Location: [facility];  Service: Orthopedics;  Laterality: Left;    FAMILY HISTORY :   Family History  Problem Relation Age of Onset  . Heart disease Mother   . Heart attack Mother   . Coronary artery disease Other        family history    SOCIAL HISTORY:   Social History   Tobacco Use  . Smoking status: Former Smoker    Packs/day: 1.00    Years: 40.00    Pack years: 40.00    Types: Cigarettes    Last attempt to quit: 07/21/2006    Years since quitting: 12.0  . Smokeless tobacco: Never Used  Substance Use Topics  . Alcohol use: Yes    Alcohol/week: 1.0 standard drinks    Types: 1 Standard drinks or equivalent per week    Comment: rarely  . Drug use: No    ALLERGIES:  has No Known Allergies.  MEDICATIONS:  Current Outpatient Medications  Medication Sig Dispense Refill  . acetaminophen (TYLENOL) 500 MG tablet Take 1,000 mg by mouth every 8 (eight) hours as needed for mild pain.    Marland Kitchen albuterol (PROVENTIL HFA;VENTOLIN HFA) 108 (90 Base) MCG/ACT inhaler Inhale 2 puffs into the lungs every 6 (six) hours as needed for wheezing or shortness of breath. 1 Inhaler 11  . amiodarone (PACERONE) 200 MG tablet TAKE 1/2 (ONE-HALF) TABLET BY MOUTH TWICE DAILY 90 tablet 0  . aspirin 81 MG tablet Take 81 mg by mouth daily.    Marland Kitchen atorvastatin (LIPITOR) 80 MG tablet  Take 1 tablet (80 mg total) by mouth at bedtime. 90 tablet 3  . budesonide-formoterol (SYMBICORT) 160-4.5 MCG/ACT inhaler Inhale 2 puffs into the lungs 2 (two) times daily. 1 Inhaler 11  . cetirizine (ZYRTEC) 10 MG tablet Take 10 mg by mouth daily as needed for allergies.     . Coenzyme Q10 (COQ10) 200 MG CAPS Take 200 mg by mouth daily.    Marland Kitchen ezetimibe (ZETIA) 10 MG tablet Take 1 tablet (10 mg total) by mouth daily. 90 tablet 3  . IMBRUVICA 420 MG TABS TAKE 1 TABLET (420 MG TOTAL) BY MOUTH DAILY. 28 tablet 3  . Krill Oil 350 MG CAPS Take 350 mg by mouth every evening.    . metoprolol tartrate (LOPRESSOR) 25 MG tablet Take 25  mg by mouth 2 (two) times daily.    . mirtazapine (REMERON) 15 MG tablet Take 15 mg by mouth at bedtime as needed (for panic associated with PTSD).     . Multiple Vitamin (MULTIVITAMIN WITH MINERALS) TABS tablet Take 1 tablet by mouth daily.    . mupirocin ointment (BACTROBAN) 2 % Apply 1 application topically 3 (three) times daily. 30 g 1  . nitroGLYCERIN (NITROSTAT) 0.4 MG SL tablet Place 1 tablet (0.4 mg total) under the tongue every 5 (five) minutes as needed for chest pain. 25 tablet 6  . tiotropium (SPIRIVA) 18 MCG inhalation capsule Place 1 capsule (18 mcg total) into inhaler and inhale at bedtime. 30 capsule 11  . traMADol (ULTRAM) 50 MG tablet TAKE 1 TABLET BY MOUTH EVERY 12 HOURS AS NEEDED FOR MODERATE PAIN 90 tablet 0   No current facility-administered medications for this visit.     PHYSICAL EXAMINATION: ECOG PERFORMANCE STATUS: 1 - Symptomatic but completely ambulatory  BP (!) 161/75 (BP Location: Right Arm, Patient Position: Sitting)   Pulse 60   Temp 98.6 F (37 C) (Tympanic)   Resp 16   Ht '5\' 9"'$  (1.753 m)   Wt 210 lb 14.4 oz (95.7 kg)   BMI 31.14 kg/m   Filed Weights   08/05/18 0959  Weight: 210 lb 14.4 oz (95.7 kg)    Physical Exam  Constitutional: He is oriented to person, place, and time and well-developed, well-nourished, and in no  distress.    Accompanied by his daughter.  HENT:  Head: Normocephalic and atraumatic.  Mouth/Throat: Oropharynx is clear and moist. No oropharyngeal exudate.  Eyes: Pupils are equal, round, and reactive to light.  Neck: Normal range of motion. Neck supple.  Cardiovascular: Normal rate and regular rhythm.  Pulmonary/Chest: No respiratory distress. He has no wheezes.  Abdominal: Soft. Bowel sounds are normal. He exhibits no distension and no mass. There is no abdominal tenderness. There is no rebound and no guarding.  Musculoskeletal: Normal range of motion.        General: No tenderness or edema.  Lymphadenopathy:  Resolved lymph nodes in the neck underarms.  Neurological: He is alert and oriented to person, place, and time.  Skin: Skin is warm.  Psychiatric: Affect normal.       LABORATORY DATA:  I have reviewed the data as listed    Component Value Date/Time   NA 142 08/05/2018 0858   NA 139 10/11/2014 1800   K 3.9 08/05/2018 0858   K 3.3 (L) 10/11/2014 1800   CL 108 08/05/2018 0858   CL 106 10/11/2014 1800   CO2 26 08/05/2018 0858   CO2 27 10/11/2014 1800   GLUCOSE 114 (H) 08/05/2018 0858   GLUCOSE 107 (H) 10/11/2014 1800   BUN 20 08/05/2018 0858   BUN 15 10/11/2014 1800   CREATININE 0.96 08/05/2018 0858   CREATININE 0.89 10/11/2014 1800   CALCIUM 8.7 (L) 08/05/2018 0858   CALCIUM 8.8 (L) 10/11/2014 1800   PROT 6.8 08/05/2018 0858   PROT 6.7 05/18/2017 1048   PROT 6.4 (L) 10/11/2014 1800   ALBUMIN 3.9 08/05/2018 0858   ALBUMIN 4.3 05/18/2017 1048   ALBUMIN 4.1 10/11/2014 1800   AST 20 08/05/2018 0858   AST 23 10/11/2014 1800   ALT 22 08/05/2018 0858   ALT 22 10/11/2014 1800   ALKPHOS 91 08/05/2018 0858   ALKPHOS 61 10/11/2014 1800   BILITOT 1.2 08/05/2018 0858   BILITOT 0.7 05/18/2017 1048   BILITOT 0.9 10/11/2014  1800   GFRNONAA >60 08/05/2018 0858   GFRNONAA >60 10/11/2014 1800   GFRAA >60 08/05/2018 0858   GFRAA >60 10/11/2014 1800    No results  found for: SPEP, UPEP  Lab Results  Component Value Date   WBC 192.9 (HH) 08/05/2018   NEUTROABS 6.6 08/05/2018   HGB 11.8 (L) 08/05/2018   HCT 40.1 08/05/2018   MCV 102.3 (H) 08/05/2018   PLT 157 08/05/2018      Chemistry      Component Value Date/Time   NA 142 08/05/2018 0858   NA 139 10/11/2014 1800   K 3.9 08/05/2018 0858   K 3.3 (L) 10/11/2014 1800   CL 108 08/05/2018 0858   CL 106 10/11/2014 1800   CO2 26 08/05/2018 0858   CO2 27 10/11/2014 1800   BUN 20 08/05/2018 0858   BUN 15 10/11/2014 1800   CREATININE 0.96 08/05/2018 0858   CREATININE 0.89 10/11/2014 1800      Component Value Date/Time   CALCIUM 8.7 (L) 08/05/2018 0858   CALCIUM 8.8 (L) 10/11/2014 1800   ALKPHOS 91 08/05/2018 0858   ALKPHOS 61 10/11/2014 1800   AST 20 08/05/2018 0858   AST 23 10/11/2014 1800   ALT 22 08/05/2018 0858   ALT 22 10/11/2014 1800   BILITOT 1.2 08/05/2018 0858   BILITOT 0.7 05/18/2017 1048   BILITOT 0.9 10/11/2014 1800       RADIOGRAPHIC STUDIES: I have personally reviewed the radiological images as listed and agreed with the findings in the report. No results found.   ASSESSMENT & PLAN:  CLL (chronic lymphocytic leukemia) ([place]) # Recurrent CLL [facility] unmutated/p53/deletion-11] August 2019 CT scan/PET shows-bilateral axilla adenopathy mediastinal adenopathy bulky abdominal/retroperitoneal adenopathy; splenomegaly.  Currently on ibrutinib.  #Clinically improving.  Continue ibrutinib.  Discussed white count is 222 likely spuriously.  We will plan to get a CT scan the next 1 to 2 months.  #Gait instability/arthritis risk of fall-discussed physical therapy patient reluctantly agrees.  # A.fib-on amiodarone sinus rhythm; off Eliquis.  Continue aspirin.  # Hypertension-systolic [id number].  Reviewed the log.  Monitor for now.  # DISPOSITION:  # Recommend PT Dx: gait instability # follow up in 6 weeks/labs-cbc./cmp/LDH;Dr.B.      No orders of the defined types were  placed in this encounter.  All questions were answered. The patient knows to call the clinic with any problems, questions or concerns.      Cammie Sickle, MD 08/05/2018 6:14 PM

## 2018-08-05 NOTE — Progress Notes (Signed)
Patient here for follow up he reports that he is "about the same".

## 2018-08-10 ENCOUNTER — Encounter: Payer: Self-pay | Admitting: Internal Medicine

## 2018-08-10 ENCOUNTER — Ambulatory Visit: Payer: Medicare HMO | Admitting: Internal Medicine

## 2018-08-10 VITALS — BP 162/80 | HR 66 | Ht 69.0 in | Wt 215.0 lb

## 2018-08-10 DIAGNOSIS — Z950 Presence of cardiac pacemaker: Secondary | ICD-10-CM | POA: Diagnosis not present

## 2018-08-10 DIAGNOSIS — I495 Sick sinus syndrome: Secondary | ICD-10-CM

## 2018-08-10 DIAGNOSIS — I480 Paroxysmal atrial fibrillation: Secondary | ICD-10-CM | POA: Diagnosis not present

## 2018-08-10 DIAGNOSIS — Z79899 Other long term (current) drug therapy: Secondary | ICD-10-CM

## 2018-08-10 NOTE — Patient Instructions (Signed)
Medication Instructions:  - Your physician recommends that you continue on your current medications as directed. Please refer to the Current Medication list given to you today.  If you need a refill on your cardiac medications before your next appointment, please call your pharmacy.   Lab work: - Your physician recommends that you have lab work today: TSH  If you have labs (blood work) drawn today and your tests are completely normal, you will receive your results only by: Marland Kitchen MyChart Message (if you have MyChart) OR . A paper copy in the mail If you have any lab test that is abnormal or we need to change your treatment, we will call you to review the results.  Testing/Procedures: - none ordered  Follow-Up: At [facility], you and your health needs are our priority.  As part of our continuing mission to provide you with exceptional heart care, we have created designated Provider Care Teams.  These Care Teams include your primary Cardiologist (physician) and Advanced Practice Providers (APPs -  Physician Assistants and Nurse Practitioners) who all work together to provide you with the care you need, when you need it. You will need a follow up appointment in 6 months with Dr. Caryl Comes.  Please call our office 2 months in advance to schedule this appointment.   - Remote monitoring is used to monitor your Pacemaker of ICD from home. This monitoring reduces the number of office visits required to check your device to one time per year. It allows [place] to keep an eye on the functioning of your device to ensure it is working properly. You are scheduled for a device check from home on 08/17/18. You may send your transmission at any time that day. If you have a wireless device, the transmission will be sent automatically. After your physician reviews your transmission, you will receive a postcard with your next transmission date.

## 2018-08-10 NOTE — Progress Notes (Signed)
Patient Care Team: Leone Haven, MD as PCP - General (Family Medicine) [name], MD as Consulting Physician (Cardiology) Cammie Sickle, MD as Medical Oncologist (Hematology and Oncology)   HPI  [name] is a 73 y.o. male Seen in follow-up for atrial fibrillation managed with ELIQUIS and amiodarone. He was seen in August 2017 with significant sinus bradycardia and underwent pacing received an MRI compatible device He also has coronary artery disease with prior bypass surgery    He has CLL and there is evidence that there is recurrence      Date Cr Hgb TSH LFTs  7/18 0.99 13.2 1.15(4/18) 29   6/19 1.08 11.8  15  1/20 0.96 11.8  22      DATE TEST    2/17    Myoview   EF 50 % No ischemia  4/17    Echo   EF 60 %          Patient denies symptoms of GI intolerance, sun sensitivity, neurological symptoms attributable to amiodarone.   The patient denies chest pain, nocturnal dyspnea, orthopnea or peripheral edema.  There have been no palpitations, lightheadedness or syncope. Chronic dyspnea  Walks w a stick  His wife is upstairs now having biopsy for bladder bleeding ? Cancer     Past Medical History:  Diagnosis Date  . Anxiety   . Arthritis   . Atrial fibrillation ([place])    a. Dx 2013, recurred 02/2014, CHA2DS2VASc = 3 -->placed on Eliquis;  b. 02/2014 Echo: EF 50-55%, mid and apical anterior septum and mid and apical inf septum are abnl, mild to mod Ao sclerosis w/o AS.  Marland Kitchen Chicken pox   . Chronic lymphocytic leukemia ([place])    a. Dx 02/2014.  Marland Kitchen Complication of anesthesia    History of  PTSD--do not touch patient when waking up from surgery.  Marland Kitchen COPD (chronic obstructive pulmonary disease) ([place])   . Coronary artery disease    a. 04/2009 CABG x 3 (LIMA->LAD, VG->OM1, VG->PDA);  b. 09/2009 Cath: occluded VG x 2 w/ patent LIMA and L->R collats. EF 55%, mild antlat HK;  c. 10/2011 MV: EF 53%, no isch/infarct-->low risk.  Marland Kitchen Dysrhythmia    hx of  a-fib  . GERD (gastroesophageal reflux disease)    occasional  . History of chemotherapy 2015-2016  . HOH (hard of hearing)    Bilateral Hearing Aids  . Hypertension   . Myocardial infarction ([place]) 2010  . OSA on CPAP    USE C-PAP  . Presence of permanent cardiac pacemaker 2017  . PTSD (post-traumatic stress disorder)   . PTSD (post-traumatic stress disorder)   . Pure hypercholesterolemia   . Rheumatic fever 1959    Past Surgical History:  Procedure Laterality Date  . ABDOMINAL HERNIA REPAIR    . APPENDECTOMY  06/21/1985  . CARDIAC CATHETERIZATION  2010; 2011   ; Dr Fletcher Anon  . CORONARY ARTERY BYPASS GRAFT  04/2009   "CABG X3"  . EP IMPLANTABLE DEVICE N/A 03/03/2016   Procedure: Pacemaker Implant;  Surgeon: Deboraha Sprang, MD;  Location: [facility];  Service: Cardiovascular;  Laterality: N/A;  . FOREIGN BODY REMOVAL  1968   "shrapnel in my tailbone"  . INGUINAL HERNIA REPAIR Right   . INSERT / REPLACE / REMOVE PACEMAKER    . JOINT REPLACEMENT Right 2018  . LAPAROSCOPIC CHOLECYSTECTOMY    . TONSILLECTOMY AND ADENOIDECTOMY  1956  . TOTAL HIP ARTHROPLASTY Right  10/22/2016   Procedure: TOTAL HIP ARTHROPLASTY;  Surgeon: Dereck Leep, MD;  Location: [facility];  Service: Orthopedics;  Laterality: Right;  . TOTAL HIP ARTHROPLASTY Left 11/04/2017   Procedure: TOTAL HIP ARTHROPLASTY;  Surgeon: Dereck Leep, MD;  Location: [facility];  Service: Orthopedics;  Laterality: Left;    Current Outpatient Medications  Medication Sig Dispense Refill  . acetaminophen (TYLENOL) 500 MG tablet Take 1,000 mg by mouth every 8 (eight) hours as needed for mild pain.    Marland Kitchen albuterol (PROVENTIL HFA;VENTOLIN HFA) 108 (90 Base) MCG/ACT inhaler Inhale 2 puffs into the lungs every 6 (six) hours as needed for wheezing or shortness of breath. 1 Inhaler 11  . amiodarone (PACERONE) 200 MG tablet TAKE 1/2 (ONE-HALF) TABLET BY MOUTH TWICE DAILY 90 tablet 0  . aspirin 81 MG tablet Take 81 mg by mouth daily.      Marland Kitchen atorvastatin (LIPITOR) 80 MG tablet Take 1 tablet (80 mg total) by mouth at bedtime. 90 tablet 3  . budesonide-formoterol (SYMBICORT) 160-4.5 MCG/ACT inhaler Inhale 2 puffs into the lungs 2 (two) times daily. 1 Inhaler 11  . cetirizine (ZYRTEC) 10 MG tablet Take 10 mg by mouth daily as needed for allergies.     . Coenzyme Q10 (COQ10) 200 MG CAPS Take 200 mg by mouth daily.    Marland Kitchen ezetimibe (ZETIA) 10 MG tablet Take 1 tablet (10 mg total) by mouth daily. 90 tablet 3  . IMBRUVICA 420 MG TABS TAKE 1 TABLET (420 MG TOTAL) BY MOUTH DAILY. 28 tablet 3  . Krill Oil 350 MG CAPS Take 350 mg by mouth every evening.    . metoprolol tartrate (LOPRESSOR) 25 MG tablet Take 25 mg by mouth 2 (two) times daily.    . mirtazapine (REMERON) 15 MG tablet Take 15 mg by mouth at bedtime as needed (for panic associated with PTSD).     . Multiple Vitamin (MULTIVITAMIN WITH MINERALS) TABS tablet Take 1 tablet by mouth daily.    . mupirocin ointment (BACTROBAN) 2 % Apply 1 application topically 3 (three) times daily. 30 g 1  . nitroGLYCERIN (NITROSTAT) 0.4 MG SL tablet Place 1 tablet (0.4 mg total) under the tongue every 5 (five) minutes as needed for chest pain. 25 tablet 6  . tiotropium (SPIRIVA) 18 MCG inhalation capsule Place 1 capsule (18 mcg total) into inhaler and inhale at bedtime. 30 capsule 11  . traMADol (ULTRAM) 50 MG tablet TAKE 1 TABLET BY MOUTH EVERY 12 HOURS AS NEEDED FOR MODERATE PAIN 90 tablet 0   No current facility-administered medications for this visit.     No Known Allergies    Review of Systems negative except from HPI and PMH  Physical Exam BP (!) 162/80 (BP Location: Left Arm, Patient Position: Sitting, Cuff Size: Normal)   Pulse 66   Ht 5\' 9"  (1.753 m)   Wt 215 lb (97.5 kg)   BMI 31.75 kg/m  Well developed and nourished in no acute distress HENT normal Neck supple with JVP-flat Clear Regular rate and rhythm, no murmurs or gallops Abd-soft with active BS No Clubbing cyanosis  edema Skin-warm and dry A & Oriented  Grossly normal sensory and motor function walks with a stick    ECG atrial paced rhythm @ 66 30/10/41  Assessment and  Plan  Sinus node dysfunction With chronotropic incompetence  Paroxysmal atrial fibrillation  CAD s/p CABG  High Risk Medication Surveillance  Dyspnea on exertion  Pacemaker-[employer]tr[employer]  Hypertension  CLL recurring   No  intercurrent atrial fibrillation or flutter  Tolerating amio-- will check TSH  On Anticoagulation;  No bleeding issues    BP elevated  Will defer to PCP as right now under signifcant stress so not sure what is his baseline  We spent more than 50% of our >25 min visit in face to face counseling regarding the above       Current medicines are reviewed at length with the patient today .  The patient does not  have concerns regarding medicines.

## 2018-08-11 LAB — CUP PACEART INCLINIC DEVICE CHECK
Battery Remaining Longevity: 86 mo
Battery Voltage: 3.01 V
Brady Statistic AP VP Percent: 0.56 %
Brady Statistic AS VS Percent: 0.3 %
Brady Statistic RA Percent Paced: 99.69 %
Brady Statistic RV Percent Paced: 0.59 %
Date Time Interrogation Session: 20200121164646
Implantable Lead Implant Date: 20170814
Implantable Lead Implant Date: 20170814
Implantable Lead Location: 753859
Implantable Lead Location: 753860
Implantable Lead Model: 5076
Implantable Lead Model: 5076
Implantable Pulse Generator Implant Date: 20170814
Lead Channel Impedance Value: 342 Ohm
Lead Channel Impedance Value: 418 Ohm
Lead Channel Impedance Value: 570 Ohm
Lead Channel Pacing Threshold Amplitude: 0.75 V
Lead Channel Pacing Threshold Amplitude: 0.75 V
Lead Channel Pacing Threshold Pulse Width: 0.4 ms
Lead Channel Pacing Threshold Pulse Width: 0.4 ms
Lead Channel Sensing Intrinsic Amplitude: 26.875 mV
Lead Channel Sensing Intrinsic Amplitude: 3.25 mV
Lead Channel Setting Pacing Amplitude: 2 V
Lead Channel Setting Pacing Amplitude: 2.5 V
Lead Channel Setting Sensing Sensitivity: 0.9 mV
MDC IDC MSMT LEADCHNL RV IMPEDANCE VALUE: 456 Ohm
MDC IDC SET LEADCHNL RV PACING PULSEWIDTH: 0.4 ms
MDC IDC STAT BRADY AP VS PERCENT: 99.14 %
MDC IDC STAT BRADY AS VP PERCENT: 0 %

## 2018-08-11 LAB — TSH: TSH: 1.68 u[IU]/mL (ref 0.450–4.500)

## 2018-08-17 ENCOUNTER — Ambulatory Visit (INDEPENDENT_AMBULATORY_CARE_PROVIDER_SITE_OTHER): Payer: Medicare HMO

## 2018-08-17 DIAGNOSIS — I495 Sick sinus syndrome: Secondary | ICD-10-CM | POA: Diagnosis not present

## 2018-08-18 NOTE — Progress Notes (Signed)
Remote pacemaker transmission.   

## 2018-08-19 ENCOUNTER — Telehealth: Payer: Self-pay

## 2018-08-19 NOTE — Telephone Encounter (Signed)
LMOVM for pt informing him that we did received his remote transmission.

## 2018-08-19 NOTE — Telephone Encounter (Signed)
Received voice mail message from patient asking for a call back regarding his if his transmission was received.  Routed to [place] clinic.

## 2018-08-20 LAB — CUP PACEART REMOTE DEVICE CHECK
Battery Voltage: 3.01 V
Brady Statistic AP VP Percent: 0.32 %
Brady Statistic AP VS Percent: 99.62 %
Brady Statistic AS VP Percent: 0 %
Brady Statistic AS VS Percent: 0.06 %
Brady Statistic RA Percent Paced: 99.93 %
Brady Statistic RV Percent Paced: 0.34 %
Implantable Lead Implant Date: 20170814
Implantable Lead Implant Date: 20170814
Implantable Lead Location: 753859
Implantable Lead Location: 753860
Implantable Lead Model: 5076
Implantable Lead Model: 5076
Implantable Pulse Generator Implant Date: 20170814
Lead Channel Impedance Value: 342 Ohm
Lead Channel Impedance Value: 437 Ohm
Lead Channel Impedance Value: 475 Ohm
Lead Channel Impedance Value: 570 Ohm
Lead Channel Pacing Threshold Amplitude: 0.625 V
Lead Channel Pacing Threshold Amplitude: 0.75 V
Lead Channel Pacing Threshold Pulse Width: 0.4 ms
Lead Channel Pacing Threshold Pulse Width: 0.4 ms
Lead Channel Sensing Intrinsic Amplitude: 24.375 mV
Lead Channel Sensing Intrinsic Amplitude: 24.375 mV
Lead Channel Sensing Intrinsic Amplitude: 3.25 mV
Lead Channel Sensing Intrinsic Amplitude: 3.25 mV
Lead Channel Setting Pacing Amplitude: 2 V
Lead Channel Setting Pacing Amplitude: 2.5 V
Lead Channel Setting Pacing Pulse Width: 0.4 ms
Lead Channel Setting Sensing Sensitivity: 0.9 mV
MDC IDC MSMT BATTERY REMAINING LONGEVITY: 85 mo
MDC IDC SESS DTM: 20200129033825

## 2018-08-30 MED FILL — IMBRUVICA 420 MG TAB: 420 | 28 days supply | Qty: 28 | Fill #2

## 2018-09-10 DIAGNOSIS — E785 Hyperlipidemia, unspecified: Secondary | ICD-10-CM | POA: Diagnosis not present

## 2018-09-10 DIAGNOSIS — I25119 Atherosclerotic heart disease of native coronary artery with unspecified angina pectoris: Secondary | ICD-10-CM | POA: Diagnosis not present

## 2018-09-10 DIAGNOSIS — J449 Chronic obstructive pulmonary disease, unspecified: Secondary | ICD-10-CM | POA: Diagnosis not present

## 2018-09-10 DIAGNOSIS — I739 Peripheral vascular disease, unspecified: Secondary | ICD-10-CM | POA: Diagnosis not present

## 2018-09-10 DIAGNOSIS — E669 Obesity, unspecified: Secondary | ICD-10-CM | POA: Diagnosis not present

## 2018-09-10 DIAGNOSIS — R69 Illness, unspecified: Secondary | ICD-10-CM | POA: Diagnosis not present

## 2018-09-10 DIAGNOSIS — I509 Heart failure, unspecified: Secondary | ICD-10-CM | POA: Diagnosis not present

## 2018-09-10 DIAGNOSIS — C9110 Chronic lymphocytic leukemia of B-cell type not having achieved remission: Secondary | ICD-10-CM | POA: Diagnosis not present

## 2018-09-10 DIAGNOSIS — I4891 Unspecified atrial fibrillation: Secondary | ICD-10-CM | POA: Diagnosis not present

## 2018-09-15 DIAGNOSIS — D2261 Melanocytic nevi of right upper limb, including shoulder: Secondary | ICD-10-CM | POA: Diagnosis not present

## 2018-09-15 DIAGNOSIS — D485 Neoplasm of uncertain behavior of skin: Secondary | ICD-10-CM | POA: Diagnosis not present

## 2018-09-15 DIAGNOSIS — Z85828 Personal history of other malignant neoplasm of skin: Secondary | ICD-10-CM | POA: Diagnosis not present

## 2018-09-15 DIAGNOSIS — X32XXXA Exposure to sunlight, initial encounter: Secondary | ICD-10-CM | POA: Diagnosis not present

## 2018-09-15 DIAGNOSIS — Z08 Encounter for follow-up examination after completed treatment for malignant neoplasm: Secondary | ICD-10-CM | POA: Diagnosis not present

## 2018-09-15 DIAGNOSIS — C44619 Basal cell carcinoma of skin of left upper limb, including shoulder: Secondary | ICD-10-CM | POA: Diagnosis not present

## 2018-09-15 DIAGNOSIS — C4441 Basal cell carcinoma of skin of scalp and neck: Secondary | ICD-10-CM | POA: Diagnosis not present

## 2018-09-15 DIAGNOSIS — L570 Actinic keratosis: Secondary | ICD-10-CM | POA: Diagnosis not present

## 2018-09-15 DIAGNOSIS — D692 Other nonthrombocytopenic purpura: Secondary | ICD-10-CM | POA: Diagnosis not present

## 2018-09-15 DIAGNOSIS — D2262 Melanocytic nevi of left upper limb, including shoulder: Secondary | ICD-10-CM | POA: Diagnosis not present

## 2018-09-15 DIAGNOSIS — D225 Melanocytic nevi of trunk: Secondary | ICD-10-CM | POA: Diagnosis not present

## 2018-09-16 ENCOUNTER — Inpatient Hospital Stay: Payer: Medicare HMO | Attending: Internal Medicine

## 2018-09-16 ENCOUNTER — Inpatient Hospital Stay (HOSPITAL_BASED_OUTPATIENT_CLINIC_OR_DEPARTMENT_OTHER): Payer: Medicare HMO | Admitting: Internal Medicine

## 2018-09-16 ENCOUNTER — Encounter: Payer: Self-pay | Admitting: Internal Medicine

## 2018-09-16 VITALS — BP 169/79 | HR 60 | Temp 97.6°F | Resp 16 | Wt 214.4 lb

## 2018-09-16 DIAGNOSIS — Z79899 Other long term (current) drug therapy: Secondary | ICD-10-CM | POA: Insufficient documentation

## 2018-09-16 DIAGNOSIS — I10 Essential (primary) hypertension: Secondary | ICD-10-CM

## 2018-09-16 DIAGNOSIS — I4891 Unspecified atrial fibrillation: Secondary | ICD-10-CM | POA: Diagnosis not present

## 2018-09-16 DIAGNOSIS — R630 Anorexia: Secondary | ICD-10-CM | POA: Diagnosis not present

## 2018-09-16 DIAGNOSIS — C9110 Chronic lymphocytic leukemia of B-cell type not having achieved remission: Secondary | ICD-10-CM | POA: Insufficient documentation

## 2018-09-16 DIAGNOSIS — Z7982 Long term (current) use of aspirin: Secondary | ICD-10-CM

## 2018-09-16 DIAGNOSIS — Z87891 Personal history of nicotine dependence: Secondary | ICD-10-CM | POA: Diagnosis not present

## 2018-09-16 DIAGNOSIS — R5383 Other fatigue: Secondary | ICD-10-CM

## 2018-09-16 DIAGNOSIS — M2550 Pain in unspecified joint: Secondary | ICD-10-CM

## 2018-09-16 DIAGNOSIS — R109 Unspecified abdominal pain: Secondary | ICD-10-CM | POA: Diagnosis not present

## 2018-09-16 LAB — CBC WITH DIFFERENTIAL/PLATELET
ABS IMMATURE GRANULOCYTES: 0.2 10*3/uL — AB (ref 0.00–0.07)
Basophils Absolute: 0.1 10*3/uL (ref 0.0–0.1)
Basophils Relative: 0 %
Eosinophils Absolute: 0.1 10*3/uL (ref 0.0–0.5)
Eosinophils Relative: 0 %
HCT: 39.7 % (ref 39.0–52.0)
HEMOGLOBIN: 12 g/dL — AB (ref 13.0–17.0)
Immature Granulocytes: 0 %
Lymphocytes Relative: 96 %
Lymphs Abs: 142 10*3/uL — ABNORMAL HIGH (ref 0.7–4.0)
MCH: 30.2 pg (ref 26.0–34.0)
MCHC: 30.2 g/dL (ref 30.0–36.0)
MCV: 100 fL (ref 80.0–100.0)
MONOS PCT: 0 %
Monocytes Absolute: 0.6 10*3/uL (ref 0.1–1.0)
Neutro Abs: 6.4 10*3/uL (ref 1.7–7.7)
Neutrophils Relative %: 4 %
Platelets: 141 10*3/uL — ABNORMAL LOW (ref 150–400)
RBC: 3.97 MIL/uL — ABNORMAL LOW (ref 4.22–5.81)
RDW: 16.8 % — ABNORMAL HIGH (ref 11.5–15.5)
WBC: 149.4 10*3/uL (ref 4.0–10.5)
nRBC: 0 % (ref 0.0–0.2)

## 2018-09-16 LAB — URINALYSIS, COMPLETE (UACMP) WITH MICROSCOPIC
Bacteria, UA: NONE SEEN
Bilirubin Urine: NEGATIVE
Glucose, UA: NEGATIVE mg/dL
Ketones, ur: NEGATIVE mg/dL
Leukocytes,Ua: NEGATIVE
Nitrite: NEGATIVE
Protein, ur: NEGATIVE mg/dL
Specific Gravity, Urine: 1.013 (ref 1.005–1.030)
pH: 6 (ref 5.0–8.0)

## 2018-09-16 NOTE — Patient Instructions (Signed)
STOP IBRUTINIB until further instructions-

## 2018-09-16 NOTE — Progress Notes (Signed)
[facility] OFFICE PROGRESS NOTE  Patient Care Team: Leone Haven, MD as PCP - General (Family Medicine) [name], MD as Consulting Physician (Cardiology) Cammie Sickle, MD as Medical Oncologist (Hematology and Oncology)  Cancer Staging No matching staging information was found for the patient.   Oncology History   # AUG 2015- SLL/CLL [Right Ax Ln Bx] s/p Benda-Rituxan x6 [finished March 2016]; Maintenance Rituxan q 55m[started April 2016; Dr.Pandit];Last Ritux Jan 2017.  MARCH 2017- CT N/C/A/P- NED. STOP Ritux; surveillance   # AUG 2019- CT/PET- progression/NO transformation;  # NOV 2019- Progression; started Ibrutinib 420 mg/d.    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; IGVH- UN-MUTATED [poor prognosis]  DIAGNOSIS: CLL  STAGE: IV  ;GOALS: control  CURRENT/MOST RECENT THERAPY : Ibrutinib [ mid sep 2019]      [place]      INTERVAL HISTORY:  Michael [id number].[id number].  male CLL-high risk currently on ibrutinib is here for follow-up.   Patient complains of excessive fatigue.  Complains of joint pains.  Complains of significant decline in quality of life for the last few weeks.  No fever no chills.  Poor appetite.  He also complains of left-sided flank pain radiating to his leg.  Feels like a muscle pull.  No fever no chills no difficulty urination.  Review of Systems  Constitutional: Positive for malaise/fatigue. Negative for chills, diaphoresis and fever.  HENT: Negative for nosebleeds and sore throat.   Eyes: Negative for double vision.  Respiratory: Negative for cough, hemoptysis, sputum production, shortness of breath and wheezing.   Cardiovascular: Negative for chest pain, palpitations, orthopnea and leg swelling.  Gastrointestinal:  Negative for abdominal pain, blood in stool, constipation, diarrhea, heartburn, melena, nausea and vomiting.  Genitourinary: Negative for dysuria, frequency and urgency.  Musculoskeletal: Positive for back pain and joint pain.  Skin: Negative.  Negative for itching and rash.  Neurological: Positive for weakness. Negative for dizziness, tingling, focal weakness and headaches.  Psychiatric/Behavioral: Negative for depression. The patient is not nervous/anxious and does not have insomnia.       PAST MEDICAL HISTORY :  Past Medical History:  Diagnosis Date  . Anxiety   . Arthritis   . Atrial fibrillation ([place]    a. Dx 2013, recurred 02/2014, CHA2DS2VASc = 3 -->placed on Eliquis;  b. 02/2014 Echo: EF 50-55%, mid and apical anterior septum and mid and apical inf septum are abnl, mild to mod Ao sclerosis w/o AS.  .Marland KitchenChicken pox   . Chronic lymphocytic leukemia ([place]    a. Dx 02/2014.  .Marland KitchenComplication of anesthesia    History of  PTSD--do not touch patient when waking up from surgery.  .Marland KitchenCOPD (chronic obstructive pulmonary disease) ([place]   . Coronary artery disease    a. 04/2009 CABG x 3 (LIMA->LAD, VG->OM1, VG->PDA);  b. 09/2009 Cath: occluded VG x 2 w/ patent LIMA and L->R collats. EF 55%, mild antlat HK;  c. 10/2011 MV: EF 53%, no isch/infarct-->low risk.  .Marland KitchenDysrhythmia    hx of a-fib  . GERD (gastroesophageal reflux disease)    occasional  . History of chemotherapy 2015-2016  . HOH (hard of hearing)    Bilateral Hearing Aids  . Hypertension   . Myocardial infarction ([place] 2010  . OSA on  CPAP    USE C-PAP  . Presence of permanent cardiac pacemaker 2017  . PTSD (post-traumatic stress disorder)   . PTSD (post-traumatic stress disorder)   . Pure hypercholesterolemia   . Rheumatic fever 1959    PAST SURGICAL HISTORY :   Past Surgical History:  Procedure Laterality Date  . ABDOMINAL HERNIA REPAIR    . APPENDECTOMY  06/21/1985  . CARDIAC CATHETERIZATION  2010; 2011   ; Dr Fletcher Anon  .  CORONARY ARTERY BYPASS GRAFT  04/2009   "CABG X3"  . EP IMPLANTABLE DEVICE N/A 03/03/2016   Procedure: Pacemaker Implant;  Surgeon: Deboraha Sprang, MD;  Location: [facility];  Service: Cardiovascular;  Laterality: N/A;  . FOREIGN BODY REMOVAL  1968   "shrapnel in my tailbone"  . INGUINAL HERNIA REPAIR Right   . INSERT / REPLACE / REMOVE PACEMAKER    . JOINT REPLACEMENT Right 2018  . LAPAROSCOPIC CHOLECYSTECTOMY    . TONSILLECTOMY AND ADENOIDECTOMY  1956  . TOTAL HIP ARTHROPLASTY Right 10/22/2016   Procedure: TOTAL HIP ARTHROPLASTY;  Surgeon: Dereck Leep, MD;  Location: [facility];  Service: Orthopedics;  Laterality: Right;  . TOTAL HIP ARTHROPLASTY Left 11/04/2017   Procedure: TOTAL HIP ARTHROPLASTY;  Surgeon: Dereck Leep, MD;  Location: [facility];  Service: Orthopedics;  Laterality: Left;    FAMILY HISTORY :   Family History  Problem Relation Age of Onset  . Heart disease Mother   . Heart attack Mother   . Coronary artery disease Other        family history    SOCIAL HISTORY:   Social History   Tobacco Use  . Smoking status: Former Smoker    Packs/day: 1.00    Years: 40.00    Pack years: 40.00    Types: Cigarettes    Last attempt to quit: 07/21/2006    Years since quitting: 12.1  . Smokeless tobacco: Never Used  Substance Use Topics  . Alcohol use: Yes    Alcohol/week: 1.0 standard drinks    Types: 1 Standard drinks or equivalent per week    Comment: rarely  . Drug use: No    ALLERGIES:  has No Known Allergies.  MEDICATIONS:  Current Outpatient Medications  Medication Sig Dispense Refill  . acetaminophen (TYLENOL) 500 MG tablet Take 1,000 mg by mouth every 8 (eight) hours as needed for mild pain.    Marland Kitchen albuterol (PROVENTIL HFA;VENTOLIN HFA) 108 (90 Base) MCG/ACT inhaler Inhale 2 puffs into the lungs every 6 (six) hours as needed for wheezing or shortness of breath. 1 Inhaler 11  . amiodarone (PACERONE) 200 MG tablet TAKE 1/2 (ONE-HALF) TABLET BY MOUTH TWICE  DAILY 90 tablet 0  . aspirin 81 MG tablet Take 81 mg by mouth daily.    Marland Kitchen atorvastatin (LIPITOR) 80 MG tablet Take 1 tablet (80 mg total) by mouth at bedtime. 90 tablet 3  . budesonide-formoterol (SYMBICORT) 160-4.5 MCG/ACT inhaler Inhale 2 puffs into the lungs 2 (two) times daily. 1 Inhaler 11  . cetirizine (ZYRTEC) 10 MG tablet Take 10 mg by mouth daily as needed for allergies.     . Coenzyme Q10 (COQ10) 200 MG CAPS Take 200 mg by mouth daily.    Marland Kitchen ezetimibe (ZETIA) 10 MG tablet Take 1 tablet (10 mg total) by mouth daily. 90 tablet 3  . IMBRUVICA 420 MG TABS TAKE 1 TABLET (420 MG TOTAL) BY MOUTH DAILY. 28 tablet 3  . Krill Oil 350 MG CAPS Take 350 mg  by mouth every evening.    . metoprolol tartrate (LOPRESSOR) 25 MG tablet Take 25 mg by mouth 2 (two) times daily.    . mirtazapine (REMERON) 15 MG tablet Take 15 mg by mouth at bedtime as needed (for panic associated with PTSD).     . Multiple Vitamin (MULTIVITAMIN WITH MINERALS) TABS tablet Take 1 tablet by mouth daily.    . mupirocin ointment (BACTROBAN) 2 % Apply 1 application topically 3 (three) times daily. 30 g 1  . nitroGLYCERIN (NITROSTAT) 0.4 MG SL tablet Place 1 tablet (0.4 mg total) under the tongue every 5 (five) minutes as needed for chest pain. 25 tablet 6  . tiotropium (SPIRIVA) 18 MCG inhalation capsule Place 1 capsule (18 mcg total) into inhaler and inhale at bedtime. 30 capsule 11  . traMADol (ULTRAM) 50 MG tablet TAKE 1 TABLET BY MOUTH EVERY 12 HOURS AS NEEDED FOR MODERATE PAIN 90 tablet 0   No current facility-administered medications for this visit.     PHYSICAL EXAMINATION: ECOG PERFORMANCE STATUS: 1 - Symptomatic but completely ambulatory  BP (!) 169/79 (BP Location: Left Arm, Patient Position: Sitting, Cuff Size: Normal)   Pulse 60   Temp 97.6 F (36.4 C) (Tympanic)   Resp 16   Wt 214 lb 6.4 oz (97.3 kg)   BMI 31.66 kg/m   Filed Weights   09/16/18 1047  Weight: 214 lb 6.4 oz (97.3 kg)    Physical Exam   Constitutional: He is oriented to person, place, and time and well-developed, well-nourished, and in no distress.    Accompanied by his daughter.  HENT:  Head: Normocephalic and atraumatic.  Mouth/Throat: Oropharynx is clear and moist. No oropharyngeal exudate.  Eyes: Pupils are equal, round, and reactive to light.  Neck: Normal range of motion. Neck supple.  Cardiovascular: Normal rate and regular rhythm.  Pulmonary/Chest: No respiratory distress. He has no wheezes.  Abdominal: Soft. Bowel sounds are normal. He exhibits no distension and no mass. There is no abdominal tenderness. There is no rebound and no guarding.  Musculoskeletal: Normal range of motion.        General: No tenderness or edema.  Lymphadenopathy:  Resolved lymph nodes in the neck underarms.  Neurological: He is alert and oriented to person, place, and time.  Skin: Skin is warm.  Psychiatric: Affect normal.       LABORATORY DATA:  I have reviewed the data as listed    Component Value Date/Time   NA 142 08/05/2018 0858   NA 139 10/11/2014 1800   K 3.9 08/05/2018 0858   K 3.3 (L) 10/11/2014 1800   CL 108 08/05/2018 0858   CL 106 10/11/2014 1800   CO2 26 08/05/2018 0858   CO2 27 10/11/2014 1800   GLUCOSE 114 (H) 08/05/2018 0858   GLUCOSE 107 (H) 10/11/2014 1800   BUN 20 08/05/2018 0858   BUN 15 10/11/2014 1800   CREATININE 0.90 09/23/2018 0937   CREATININE 0.89 10/11/2014 1800   CALCIUM 8.7 (L) 08/05/2018 0858   CALCIUM 8.8 (L) 10/11/2014 1800   PROT 6.8 08/05/2018 0858   PROT 6.7 05/18/2017 1048   PROT 6.4 (L) 10/11/2014 1800   ALBUMIN 3.9 08/05/2018 0858   ALBUMIN 4.3 05/18/2017 1048   ALBUMIN 4.1 10/11/2014 1800   AST 20 08/05/2018 0858   AST 23 10/11/2014 1800   ALT 22 08/05/2018 0858   ALT 22 10/11/2014 1800   ALKPHOS 91 08/05/2018 0858   ALKPHOS 61 10/11/2014 1800   BILITOT 1.2 08/05/2018  0858   BILITOT 0.7 05/18/2017 1048   BILITOT 0.9 10/11/2014 1800   GFRNONAA >60 08/05/2018 0858    GFRNONAA >60 10/11/2014 1800   GFRAA >60 08/05/2018 0858   GFRAA >60 10/11/2014 1800    No results found for: SPEP, UPEP  Lab Results  Component Value Date   WBC 149.4 (HH) 09/16/2018   NEUTROABS 6.4 09/16/2018   HGB 12.0 (L) 09/16/2018   HCT 39.7 09/16/2018   MCV 100.0 09/16/2018   PLT 141 (L) 09/16/2018      Chemistry      Component Value Date/Time   NA 142 08/05/2018 0858   NA 139 10/11/2014 1800   K 3.9 08/05/2018 0858   K 3.3 (L) 10/11/2014 1800   CL 108 08/05/2018 0858   CL 106 10/11/2014 1800   CO2 26 08/05/2018 0858   CO2 27 10/11/2014 1800   BUN 20 08/05/2018 0858   BUN 15 10/11/2014 1800   CREATININE 0.90 09/23/2018 0937   CREATININE 0.89 10/11/2014 1800      Component Value Date/Time   CALCIUM 8.7 (L) 08/05/2018 0858   CALCIUM 8.8 (L) 10/11/2014 1800   ALKPHOS 91 08/05/2018 0858   ALKPHOS 61 10/11/2014 1800   AST 20 08/05/2018 0858   AST 23 10/11/2014 1800   ALT 22 08/05/2018 0858   ALT 22 10/11/2014 1800   BILITOT 1.2 08/05/2018 0858   BILITOT 0.7 05/18/2017 1048   BILITOT 0.9 10/11/2014 1800       RADIOGRAPHIC STUDIES: I have personally reviewed the radiological images as listed and agreed with the findings in the report. No results found.   ASSESSMENT & PLAN:  CLL (chronic lymphocytic leukemia) ([place]) # Recurrent CLL [facility] unmutated/p53/deletion-11] August 2019 CT scan/PET shows-bilateral axilla adenopathy mediastinal adenopathy bulky abdominal/retroperitoneal adenopathy; splenomegaly.  Currently on ibrutinib.  #Clinically improving; White count improving at 149 likely spuriously.  However hold off ibrutinib given the poor tolerance [see below]  # Extreme fatigue/joint pains myalgias likely related to ibrutinib.  Hold ibrutinib  # Left flank pain/ radiation  ~ 3 weeks/ ? UA./ fatigued; UA/culture.   # A.fib-on amiodarone sinus rhythm; off Eliquis.  Continue aspirin.  Stable.  # Hypertension-systolic [phone].  Stable  # DISPOSITION:  check UA/culture today/ copy of labs # follow up in 1 week; CT scans N/C/A/P prior-Dr.B    Orders Placed This Encounter  Procedures  . Urine Culture    Standing Status:   Future    Number of Occurrences:   1    Standing Expiration Date:   09/17/2019  . CT SOFT TISSUE NECK W CONTRAST    Standing Status:   Future    Number of Occurrences:   1    Standing Expiration Date:   12/15/2019    Order Specific Question:   ** REASON FOR EXAM (FREE TEXT)    Answer:   CLL    Order Specific Question:   If indicated for the ordered procedure, I authorize the administration of contrast media per Radiology protocol    Answer:   Yes    Order Specific Question:   Preferred imaging location?    Answer:    [facility]    Order Specific Question:   Radiology Contrast Protocol - do NOT remove file path    Answer:   \\[email or web address]  . CT CHEST W CONTRAST    Standing Status:   Future    Number of Occurrences:   1    Standing Expiration Date:   09/17/2019  Order Specific Question:   If indicated for the ordered procedure, I authorize the administration of contrast media per Radiology protocol    Answer:   Yes    Order Specific Question:   Preferred imaging location?    Answer:   [facility]    Order Specific Question:   Radiology Contrast Protocol - do NOT remove file path    Answer:   [email or web address]    Order Specific Question:   ** REASON FOR EXAM (FREE TEXT)    Answer:   CLL  . CT ABDOMEN PELVIS W CONTRAST    Standing Status:   Future    Number of Occurrences:   1    Standing Expiration Date:   09/17/2019    Order Specific Question:   If indicated for the ordered procedure, I authorize the administration of contrast media per Radiology protocol    Answer:   Yes    Order Specific Question:   Preferred imaging location?    Answer:   [facility]    Order Specific Question:   Radiology Contrast Protocol - do NOT remove file path     Answer:   [email or web address]    Order Specific Question:   ** REASON FOR EXAM (FREE TEXT)    Answer:   CLL  . Urinalysis, Complete w Microscopic    Standing Status:   Future    Number of Occurrences:   1    Standing Expiration Date:   09/17/2019   All questions were answered. The patient knows to call the clinic with any problems, questions or concerns.      Cammie Sickle, MD 09/25/2018 4:01 PM

## 2018-09-16 NOTE — Assessment & Plan Note (Addendum)
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  Currently on ibrutinib.  #Clinically improving; White count improving at 149 likely spuriously.  However hold off ibrutinib given the poor tolerance [see below]  # Extreme fatigue/joint pains myalgias likely related to ibrutinib.  Hold ibrutinib  # Left flank pain/ radiation  ~ 3 weeks/ ? UA./ fatigued; UA/culture.   # A.fib-on amiodarone sinus rhythm; off Eliquis.  Continue aspirin.  Stable.  # Hypertension-systolic [phone].  Stable  # DISPOSITION: check UA/culture today/ copy of labs # follow up in 1 week; CT scans N/C/A/P prior-Dr.B

## 2018-09-17 LAB — URINE CULTURE: Culture: <10000 — AB

## 2018-09-23 ENCOUNTER — Ambulatory Visit
Admission: RE | Admit: 2018-09-23 | Discharge: 2018-09-23 | Disposition: A | Discharge status: Home / Self Care | Payer: Medicare HMO | Source: Ambulatory Visit | Attending: Internal Medicine | Admitting: Internal Medicine

## 2018-09-23 ENCOUNTER — Other Ambulatory Visit: Payer: Self-pay

## 2018-09-23 DIAGNOSIS — C9110 Chronic lymphocytic leukemia of B-cell type not having achieved remission: Secondary | ICD-10-CM | POA: Diagnosis not present

## 2018-09-23 LAB — POCT I-STAT CREATININE: Creatinine, Ser: 0.9 mg/dL (ref 0.61–1.24)

## 2018-09-23 IMAGING — CT CT NECK W/ CM
4 of 6 series · 13 of 33 positions shown, 15 images · IV contrast (omnipaque)
Comparison: PET-CT [DATE]. Neck CT [DATE]. chest CT today
reported separately.

CLINICAL DATA: 72-year-old male with chronic lymphocytic leukemia
diagnosed in [AW]. Recently came off chemotherapy.

EXAM:
CT NECK WITH CONTRAST
TECHNIQUE: Multidetector CT imaging of the neck was performed using the
standard protocol following the bolus administration of intravenous
contrast.
CONTRAST:  75mL OMNIPAQUE IOHEXOL 300 MG/ML  SOLN

[Series 17: bone windows neck · axial · 0.63mm/px · z∈[-802,-700]mm · 2 of 153 slices shown]
[im 51/153  bone]
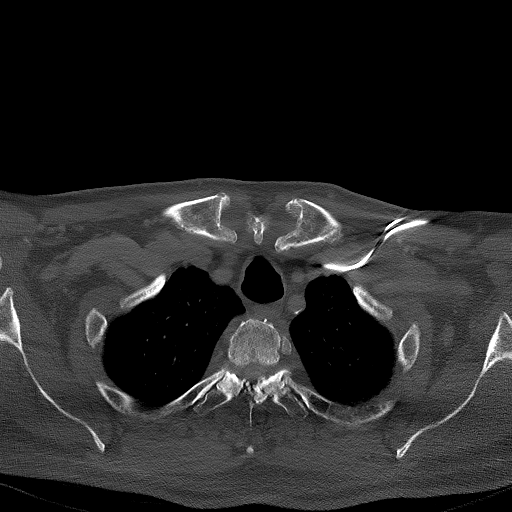
[im 102/153  bone]
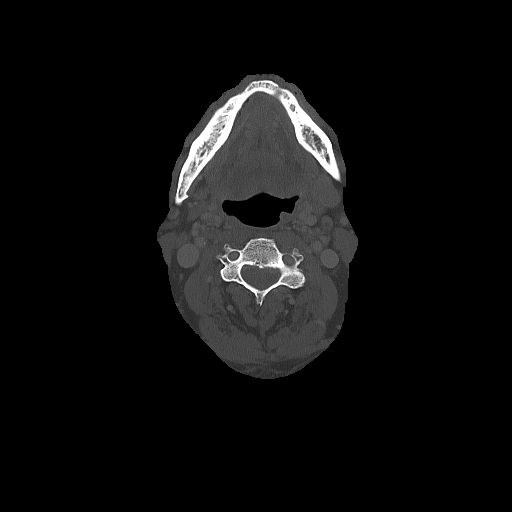

[Series 19: coronals neck · coronal · 0.63mm/px · 3 of 137 slices shown]
[im 43/137  bone]
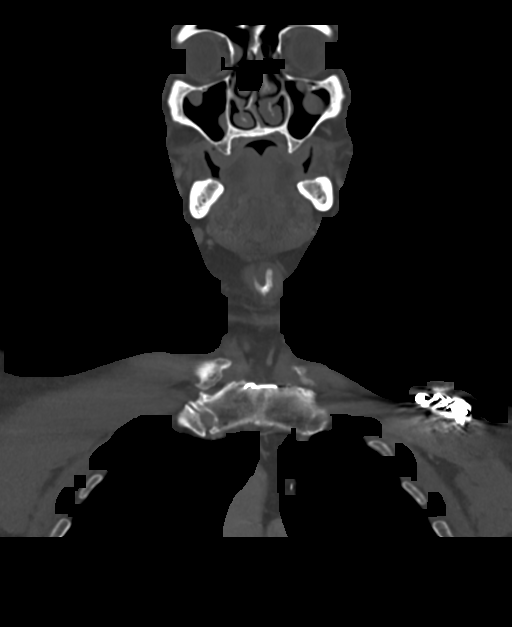
[im 60/137  bone]
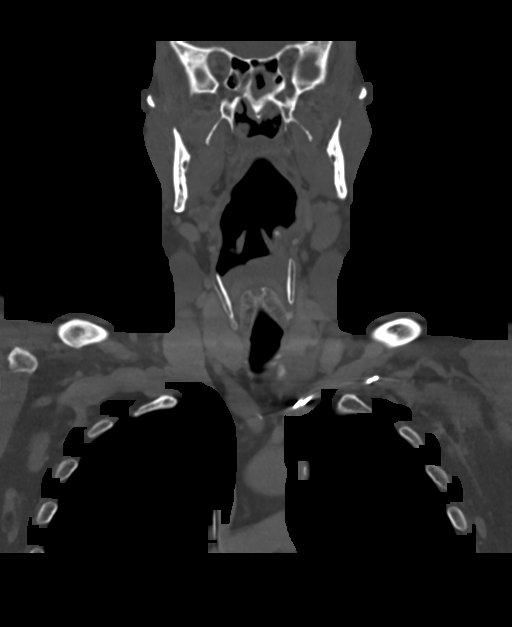
[im 77/137  bone]
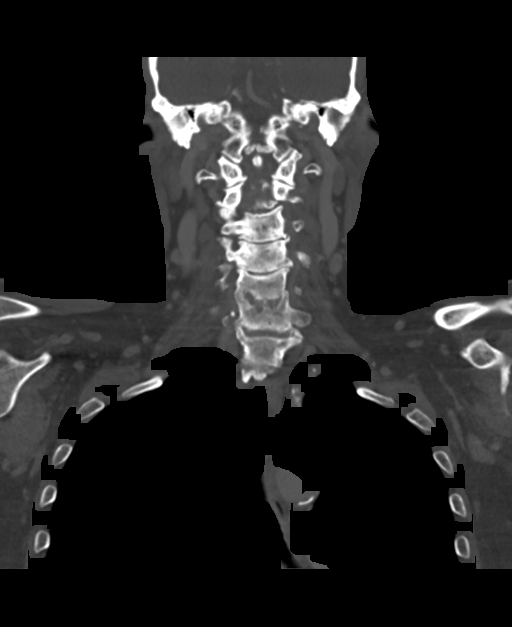

[Series 21: sagittals neck · sagittal · 0.63mm/px · 5 of 160 slices shown, 6 images]
[im 54/160  bone]
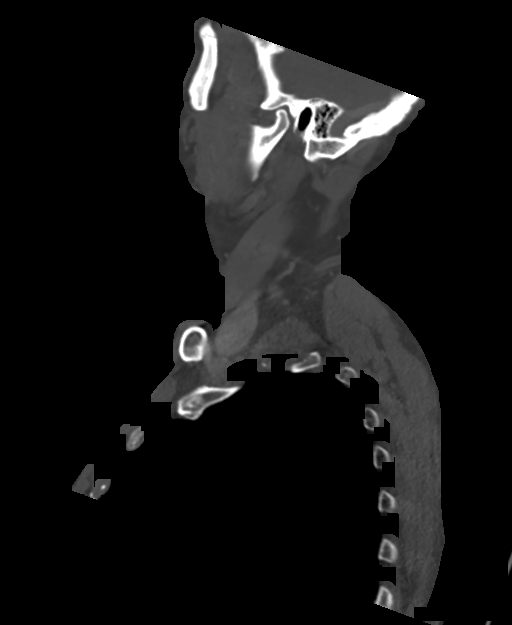
[im 67/160  bone]
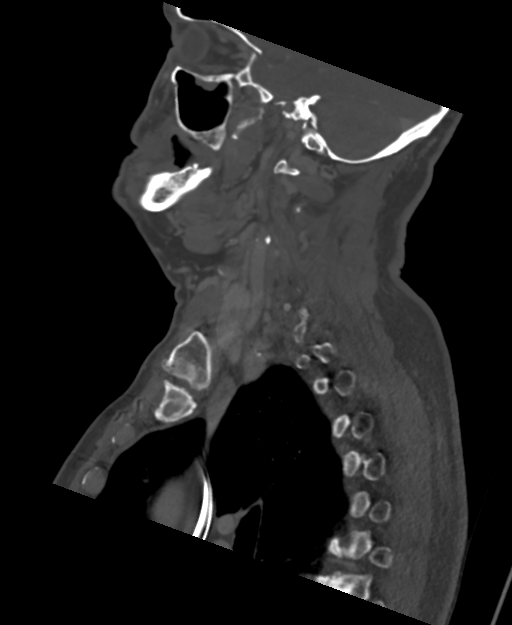
[im 80/160  soft-tissue]
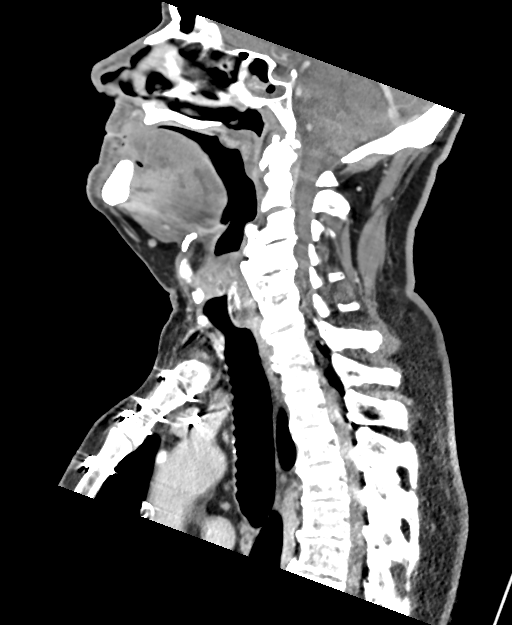
[im 80/160  bone]
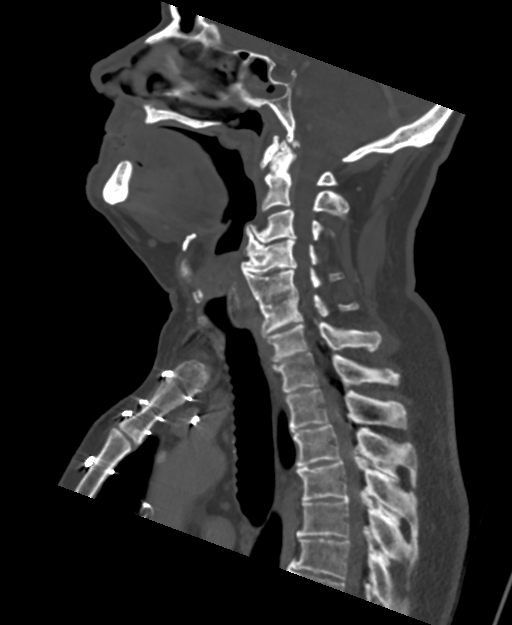
[im 93/160  bone]
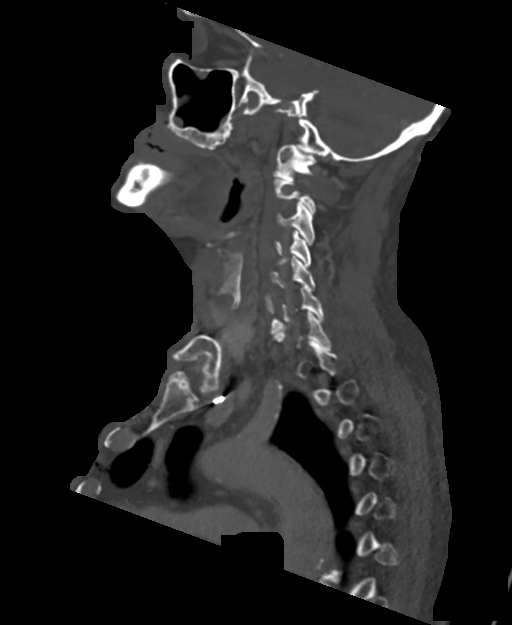
[im 107/160  bone]
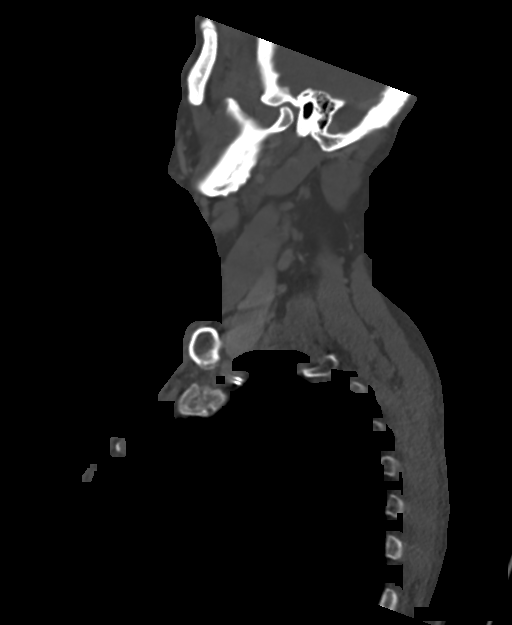

[Series 23: ax oropharynx neck (person_name) · axial · 0.63mm/px · z∈[-923,-736]mm · 3 of 206 slices shown, 4 images]
[im 52/206  soft-tissue]
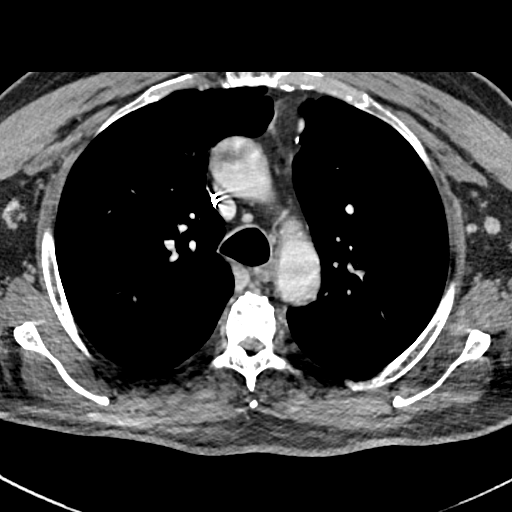
[im 52/206  bone]
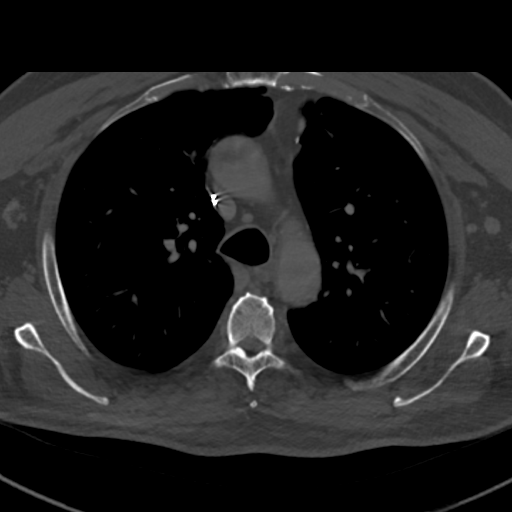
[im 103/206  bone]
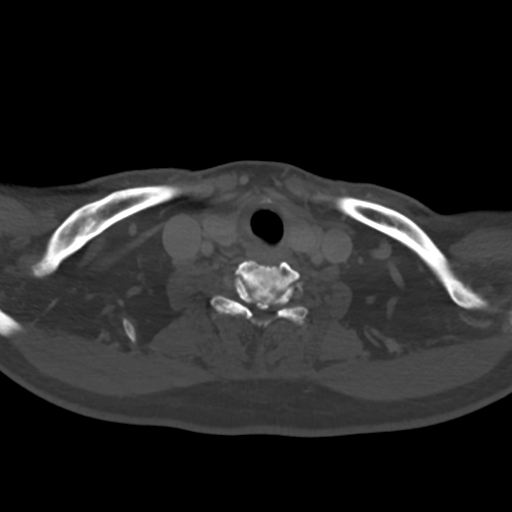
[im 154/206  bone]
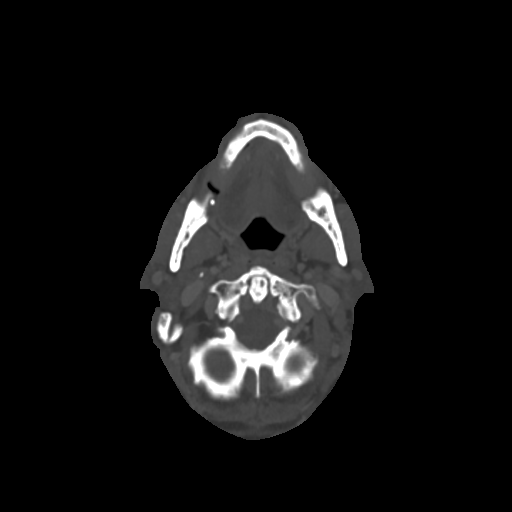

[13 of 33 positions shown; findings below may reference images not displayed]

FINDINGS: Pharynx and larynx: The glottis is closed. Laryngeal and pharyngeal
soft tissue contours are within normal limits. Negative
parapharyngeal and retropharyngeal spaces.

Salivary glands: Negative sublingual space. Submandibular and
parotid glands appear symmetric and within normal limits.

Thyroid: Negative.

Lymph nodes: Regressed bilateral cervical lymphadenopathy at all
nodal stations. There is an 8 millimeter short axis node at the left
level IIIb station on series 16, image 70 which was formally 20-21
millimeters. Most of the remaining nodes are 6 millimeter short axis
or smaller.

Vascular: Major vascular structures in the neck and at the skull
base are patent. Bilateral carotid atherosclerosis which is at least
moderate.

Limited intracranial: Negative.

Visualized orbits: Negative.

Mastoids and visualized paranasal sinuses: Progressed bilateral
paranasal sinus mucosal thickening which is most pronounced in the
left sphenoid. Multiple small chronic sinus mucous retention cysts
are suspected. The tympanic cavities and mastoids remain clear.

Skeleton: Stable visualized osseous structures. Advanced cervical
spine degeneration with evidence of Diffuse idiopathic skeletal
hyperostosis (DISH) and subsequent multilevel cervical ankylosis.
Prior sternotomy. Absent dentition.

Upper chest: Reported separately today.
IMPRESSION: 1. Resolved bilateral cervical lymphadenopathy since [REDACTED].
Residual nodes are 8 mm short axis or smaller.
2. Bilateral cervical carotid atherosclerosis which might be
hemodynamically significant.
3. Mildly progressed paranasal sinus disease.
4.  CT Chest, Abdomen, and Pelvis today are reported separately.

## 2018-09-23 IMAGING — CT CT CHEST W/ CM
2 of 5 series · 12 of 36 positions shown, 15 images · IV contrast (omnipaque)
Comparison: [DATE] PET-CT. [DATE] CT chest, abdomen and
pelvis.

CLINICAL DATA: CLL.  Recent completion of chemotherapy.  Restaging.

EXAM:
CT CHEST, ABDOMEN, AND PELVIS WITH CONTRAST
TECHNIQUE: Multidetector CT imaging of the chest, abdomen and pelvis was
performed following the standard protocol during bolus
administration of intravenous contrast.
CONTRAST:  75mL OMNIPAQUE IOHEXOL 300 MG/ML  SOLN

[Series 3: cap with (person_name) · axial · 0.76mm/px · z∈[-1380,-805]mm · 9 of 141 slices shown, 12 images]
[im 13/141  mediastinal]
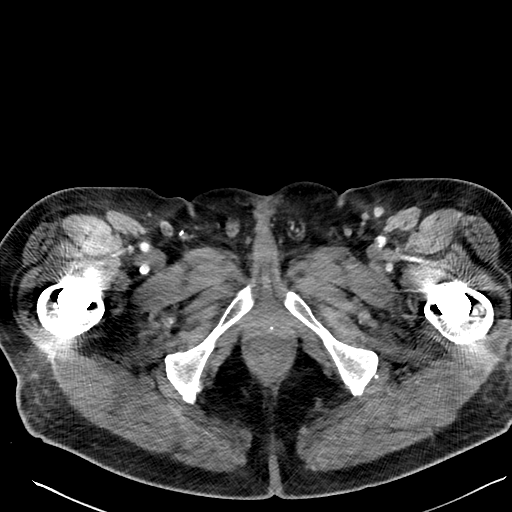
[im 13/141  lung]
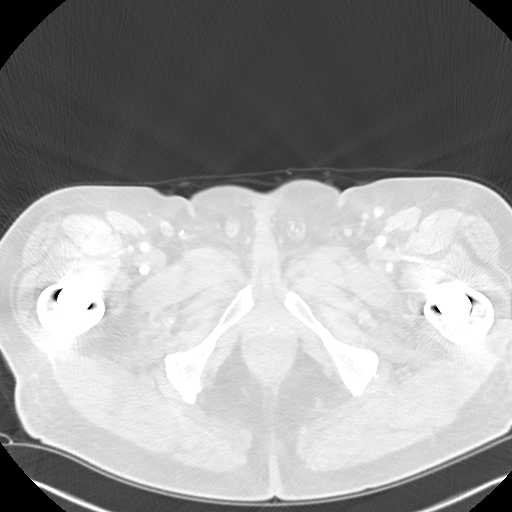
[im 26/141  lung]
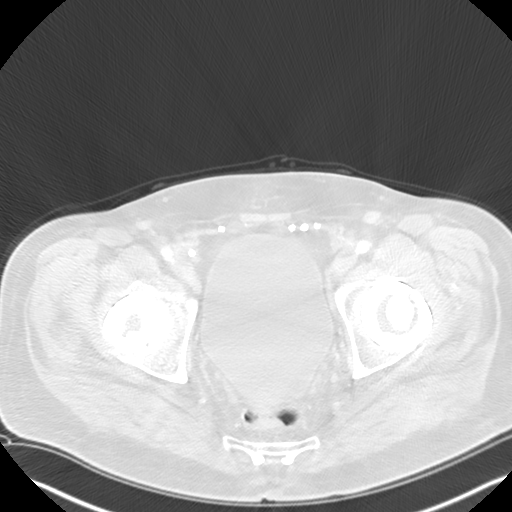
[im 39/141  lung]
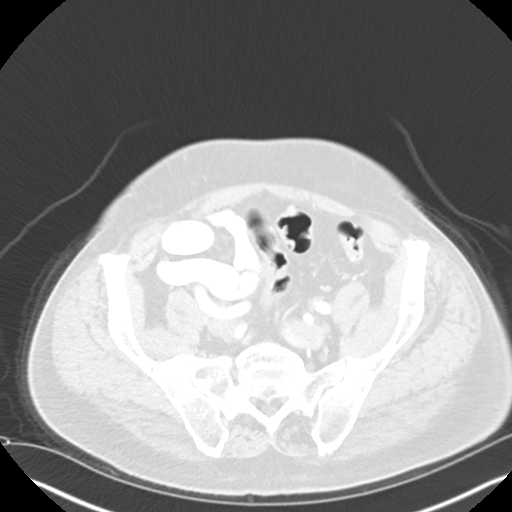
[im 51/141  lung]
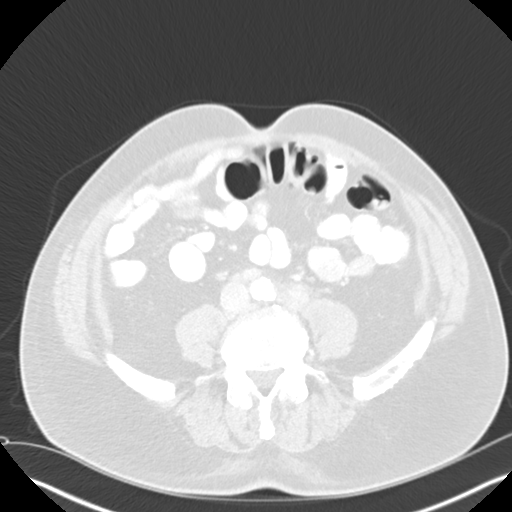
[im 77/141  mediastinal]
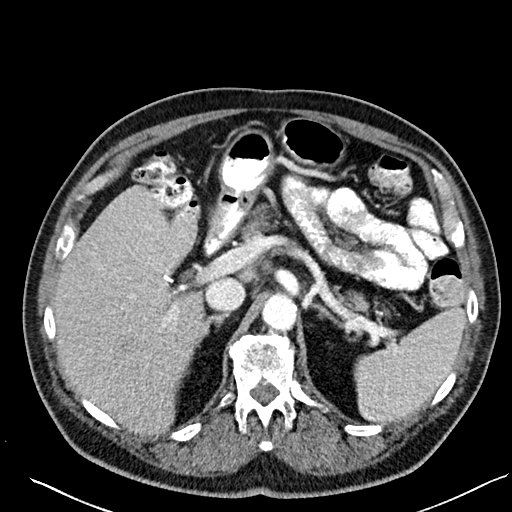
[im 77/141  lung]
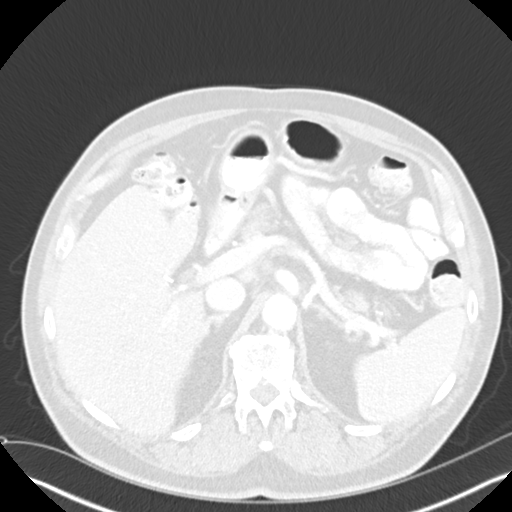
[im 90/141  lung]
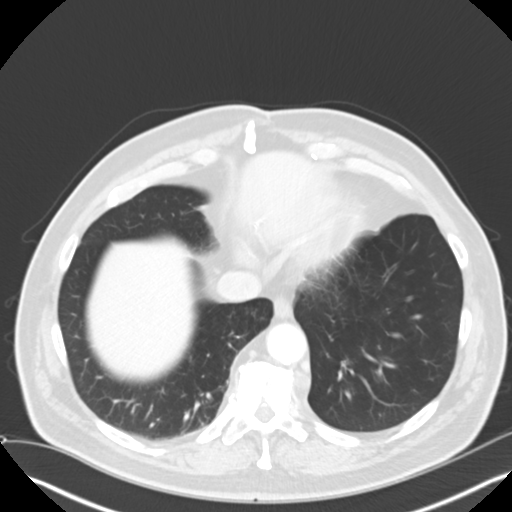
[im 102/141  lung]
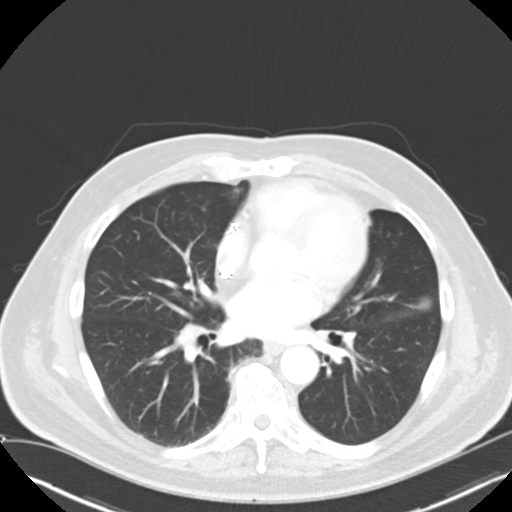
[im 115/141  lung]
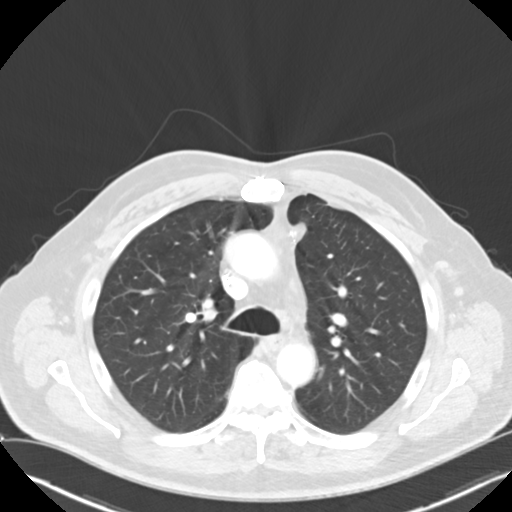
[im 128/141  mediastinal]
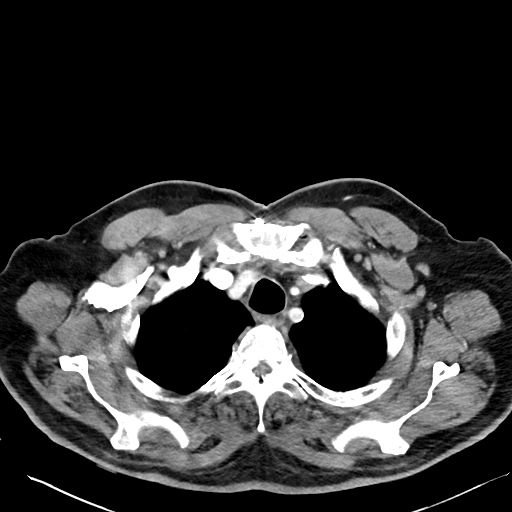
[im 128/141  lung]
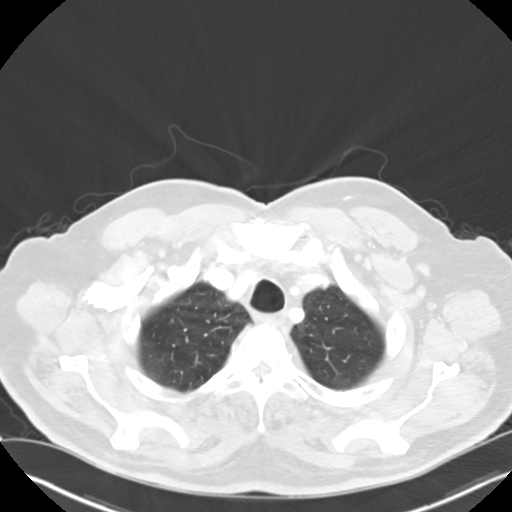

[Series 5: coronal cap with (person_name) · coronal · 0.76mm/px · 3 of 188 slices shown]
[im 38/188  lung]
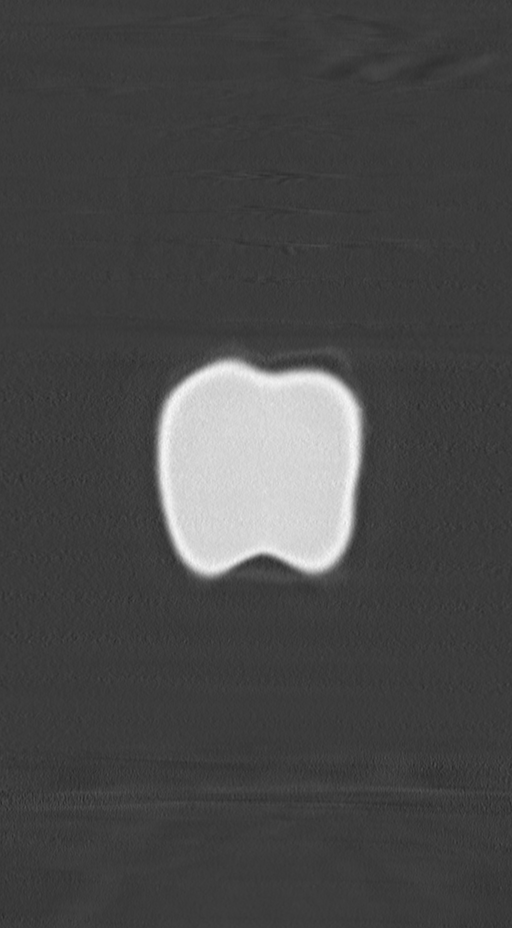
[im 75/188  lung]
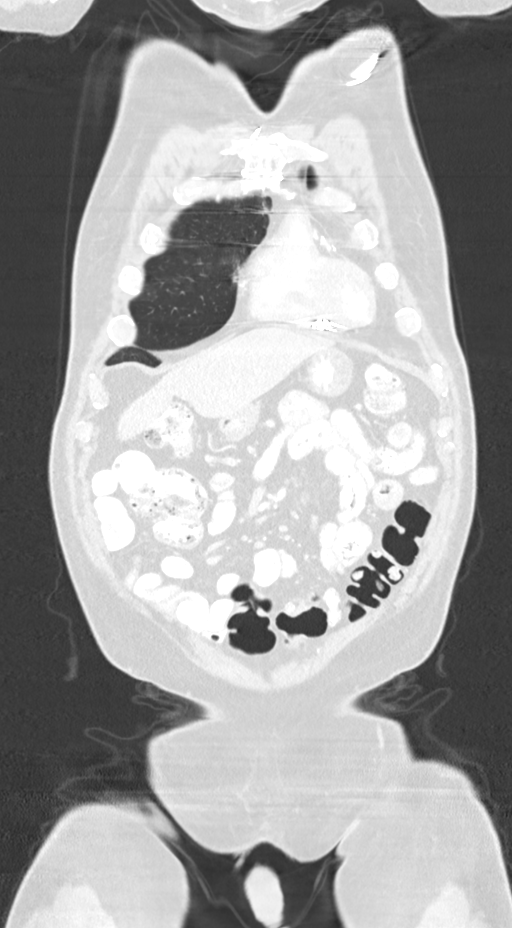
[im 113/188  lung]
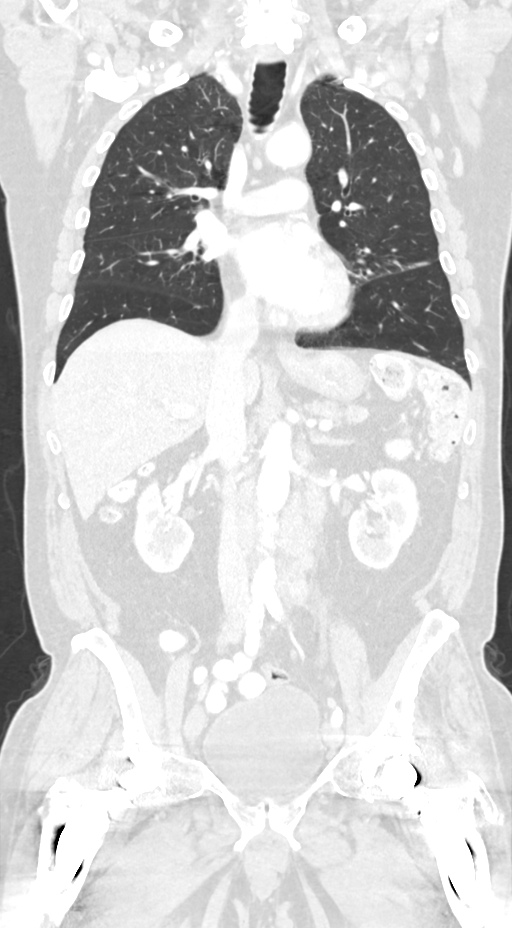

[12 of 36 positions shown; findings below may reference images not displayed]

FINDINGS: CT CHEST FINDINGS

Cardiovascular: Normal heart size. No significant pericardial
effusion/thickening. Three-vessel coronary atherosclerosis status
post CABG. 2 lead left subclavian pacemaker with lead tips in the
right atrium and right ventricular apex. Atherosclerotic
nonaneurysmal thoracic aorta. Normal caliber pulmonary arteries. No
central pulmonary emboli.

Mediastinum/Nodes: No discrete thyroid nodules. Stable subcentimeter
hypodense right thyroid lobe nodule. Substantially decreased
bilateral axillary, mediastinal and bilateral hilar lymphadenopathy.
Representative 1.4 cm right axillary node (series 3/image 18),
decreased from 2.8 cm. Representative 1.2 cm left axillary node
(series 3/image 19), decreased from 2.2 cm. No new or residual
pathologically enlarged mediastinal or hilar lymph nodes.

Lungs/Pleura: No pneumothorax. No pleural effusion. No acute
consolidative airspace disease, lung masses or significant pulmonary
nodules. Stable small parenchymal bands in lingula and dependent
lung bases compatible with mild postinfectious/postinflammatory
scarring.

Musculoskeletal: No aggressive appearing focal osseous lesions.
Intact sternotomy wires. Moderate thoracic spondylosis.

CT ABDOMEN PELVIS FINDINGS

Hepatobiliary: Normal liver with no liver mass. Cholecystectomy. No
biliary ductal dilatation.

Pancreas: Normal, with no mass or duct dilation.

Spleen: Normal size. No mass.

Adrenals/Urinary Tract: Normal adrenals. Simple parapelvic 2.9 cm
upper right renal cyst. Otherwise normal kidneys, with no
hydronephrosis. Normal bladder.

Stomach/Bowel: Normal non-distended stomach. Normal caliber small
bowel with no small bowel wall thickening. Appendectomy. Mild
sigmoid diverticulosis, no large bowel wall thickening or
significant pericolonic fat stranding. Oral contrast transits to the
left colon.

Vascular/Lymphatic: Atherosclerotic abdominal aorta with stable
ectatic 2.8 cm infrarenal abdominal aorta. Patent portal, splenic
and renal veins. Substantial interval reduction in the porta
hepatis, retroperitoneal and bilateral pelvic adenopathy.
Representative 1.2 cm porta hepatis node (series 3/image 64),
decreased from 4.2 cm. Representative 1.5 cm aortocaval node (series
3/image 81), decreased from 4.1 cm. Representative 2.4 cm left
para-aortic node (series 3/image 78), decreased from 6.3 cm.
Representative 1.3 cm left common iliac node (series 3/image 94),
decreased from 3.1 cm. No additional residual pathologically
enlarged pelvic lymph nodes.

Reproductive: Mildly enlarged prostate.

Other: No pneumoperitoneum, ascites or focal fluid collection.

Musculoskeletal: No aggressive appearing focal osseous lesions.
Bilateral total hip arthroplasty. Moderate lumbar spondylosis.
IMPRESSION: 1. Substantial interval reduction in the bilateral axillary,
mediastinal, bilateral hilar, retroperitoneal and bilateral pelvic
adenopathy as detailed. Residual mild adenopathy in the bilateral
axilla, retroperitoneum and left common iliac chain.
2. Ectatic infrarenal 2.8 cm abdominal aorta, at risk for aneurysm
development. Recommend follow-up aortic ultrasound in 5 years. This
recommendation follows ACR consensus guidelines: White Paper of the
ACR Incidental Findings Committee II on Vascular Findings. [HOSPITAL] [IW]; [DATE].
3. Aortic Atherosclerosis ([IW]-[IW]). Additional chronic findings
as detailed.

## 2018-09-23 MED ORDER — IOHEXOL 300 MG/ML  SOLN
75.0000 mL | Freq: Once | INTRAMUSCULAR | Status: AC | PRN
  Administered 2018-09-23: 75 mL via INTRAVENOUS

## 2018-09-27 ENCOUNTER — Telehealth: Payer: Self-pay | Admitting: Pharmacist

## 2018-09-27 ENCOUNTER — Other Ambulatory Visit: Payer: Self-pay

## 2018-09-27 ENCOUNTER — Encounter: Payer: Self-pay | Admitting: Internal Medicine

## 2018-09-27 ENCOUNTER — Telehealth: Payer: Self-pay | Admitting: Pharmacy Technician

## 2018-09-27 ENCOUNTER — Inpatient Hospital Stay: Payer: Medicare HMO | Attending: Internal Medicine | Admitting: Internal Medicine

## 2018-09-27 VITALS — BP 157/78 | HR 69 | Resp 16 | Wt 217.2 lb

## 2018-09-27 DIAGNOSIS — M2550 Pain in unspecified joint: Secondary | ICD-10-CM

## 2018-09-27 DIAGNOSIS — I10 Essential (primary) hypertension: Secondary | ICD-10-CM | POA: Diagnosis not present

## 2018-09-27 DIAGNOSIS — Z7901 Long term (current) use of anticoagulants: Secondary | ICD-10-CM | POA: Diagnosis not present

## 2018-09-27 DIAGNOSIS — R5383 Other fatigue: Secondary | ICD-10-CM

## 2018-09-27 DIAGNOSIS — Z87891 Personal history of nicotine dependence: Secondary | ICD-10-CM

## 2018-09-27 DIAGNOSIS — I4891 Unspecified atrial fibrillation: Secondary | ICD-10-CM | POA: Diagnosis not present

## 2018-09-27 DIAGNOSIS — C9110 Chronic lymphocytic leukemia of B-cell type not having achieved remission: Secondary | ICD-10-CM | POA: Diagnosis not present

## 2018-09-27 NOTE — Telephone Encounter (Signed)
Oral Oncology Patient Advocate Encounter  Prior Authorization for [name] has been approved.    PA# [id number] Effective dates: 07/19/2018 through 07/21/2019  Patients co-pay is $140.14  Oral Oncology Clinic will continue to follow.   [name] [facility] [name] Phone [phone] Fax [phone] 09/27/2018 2:23 PM

## 2018-09-27 NOTE — Assessment & Plan Note (Addendum)
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  Currently on hold because of intolerance.  March 2020 CT scan shows significant improvement of adenopathy chest axilla/abdomen.   #Recommend continued holding of ibrutinib given the significant myalgias fatigue joint pains.  Discussed using venetoclax at the next option.  #I had a long discussion regarding starting the patient on venetoclax.  Given the low white count/lymph node mass less than 5 cm patient is candidate for outpatient treatment.  Discussed the risk of tumor lysis syndrome; and strict need to follow guidelines regarding testing for tumor lysis.  Discussed that duration of treatment is 2 years.  And is a good chance that patient would likely go into remission; not needing ongoing therapy.  # #Tumor lysis prophylaxis -start patient on allopurinol discussed the potential rash.  Discussed regarding IV fluids/the day of venetoclax administration.  # A.fib-on amiodarone sinus rhythm;-Restart Eliquis. STOP aspirin.  Interaction of amio with venatoclax. Will check with cardiology.   # Hypertension-systolic [phone]. Stable.   # DISPOSITION: # follow up in 3 weeks MD/labs- cbc/cmp/ldh/mag/phos/uric acid-Dr.B  # 40 minutes face-to-face with the patient discussing the above plan of care; more than 50% of time spent on prognosis/ natural history; counseling and coordination.

## 2018-09-27 NOTE — Telephone Encounter (Signed)
Oral Oncology Pharmacist Encounter  Received new prescription for Venclexta (venetoclax) for the treatment of CLL, planned duration until disease progression or unacceptable drug toxicity.  CBC from 09/16/2018 assessed, WBC/absolute lymphocytes increased. Repeat blood work will determine inpatient vs outpatient treatment. Prescription dose and frequency assessed.   Current medication list in Epic reviewed, one DDIs with venetoclax identified: - Amiodarone: amiodarone may increase the serum concentration of Venetoclax. Discussed interaction with Dr. Rogue Bussing, he is planning on reaching out to cardiology to discuss discontinuing the amiodarone. If the interaction can not be avoided, my recommendation would be to follow regular dose escalation of venetoclax in CLL with the plan to stop the escalation at 200mg  daily.   Prescription has been e-scribed to the [facility] for benefits analysis and approval.  Patient education Counseled patient and his daughter on administration, dosing, side effects, monitoring, drug-food interactions, safe handling, storage, and disposal. Patient will take venetoclax following planned dose escalation.  Side effects include but not limited to: TLS, fatigue, N/V/D, decreased wbc.    Reviewed with patient importance of keeping a medication schedule and plan for any missed doses.  Mr. Ende voiced understanding and appreciation. All questions answered. Medication handout provided and consent obtained.  Oral Oncology Clinic will continue to follow for insurance authorization, copayment issues, and start date.  Provided patient with [facility] phone number. Patient knows to call the office with questions or concerns. Oral Chemotherapy Navigation Clinic will continue to follow.  Darl Pikes, PharmD, BCPS, [facility] Hematology/Oncology Clinical Pharmacist [facility] [phone]  09/27/2018 12:27 PM

## 2018-09-27 NOTE — Telephone Encounter (Signed)
Oral Oncology Patient Advocate Encounter  Received notification from [employer] that prior authorization for Venclexta is required.  PA submitted on CoverMyMeds Key [id number] Status is pending  Oral Oncology Clinic will continue to follow.  [facility] Patient [place] Phone [phone] Fax [phone] 09/27/2018 1:55 PM

## 2018-09-27 NOTE — Progress Notes (Signed)
[place] OFFICE PROGRESS NOTE  Patient Care Team: Leone Haven, MD as PCP - General (Family Medicine) [name], MD as Consulting Physician (Cardiology) Cammie Sickle, MD as Medical Oncologist (Hematology and Oncology)  Cancer Staging No matching staging information was found for the patient.   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  MARCH 2017- CT N/C/A/P- NED. STOP Ritux; surveillance   # AUG 2019- CT/PET- progression/NO transformation;  # NOV 2019- Progression; started Ibrutinib 420 mg/d. STOPPED in end of feb sec to extreme fatigue/joint pains/cramps   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; IGVH- UN-MUTATED [poor prognosis]  DIAGNOSIS: CLL  STAGE: IV  ;GOALS: control  CURRENT/MOST RECENT THERAPY : Ibrutinib on HOLD      C[place]chronic lymphocytic leukemia) ([place]      INTERVAL HISTORY:  Michael DIEGUEZ89y.o.  male CLL-high risk most recently on ibrutinib is here for follow-up/review the results CT scan.  Patient's energy levels are improving since holding of the ibrutinib.  Continues have joint pains but overall improved.  No chest pain or shortness of breath.  No cough.  Mild to moderate fatigue.   Review of Systems  Constitutional: Positive for malaise/fatigue. Negative for chills, diaphoresis and fever.  HENT: Negative for nosebleeds and sore throat.   Eyes: Negative for double vision.  Respiratory: Negative for cough, hemoptysis, sputum production, shortness of breath and wheezing.   Cardiovascular: Negative for chest pain, palpitations, orthopnea and leg swelling.  Gastrointestinal: Negative for abdominal pain, blood  in stool, constipation, diarrhea, heartburn, melena, nausea and vomiting.  Genitourinary: Negative for dysuria, frequency and urgency.  Musculoskeletal: Positive for back pain and joint pain.  Skin: Negative.  Negative for itching and rash.  Neurological: Positive for weakness. Negative for dizziness, tingling, focal weakness and headaches.  Psychiatric/Behavioral: Negative for depression. The patient is not nervous/anxious and does not have insomnia.       PAST MEDICAL HISTORY :  Past Medical History:  Diagnosis Date  . Anxiety   . Arthritis   . Atrial fibrillation ([place]    a. Dx 2013, recurred 02/2014, CHA2DS2VASc = 3 -->placed on Eliquis;  b. 02/2014 Echo: EF 50-55%, mid and apical anterior septum and mid and apical inf septum are abnl, mild to mod Ao sclerosis w/o AS.  .Marland KitchenChicken pox   . Chronic lymphocytic leukemia ([place]    a. Dx 02/2014.  .Marland KitchenComplication of anesthesia    History of  PTSD--do not touch patient when waking up from surgery.  .Marland KitchenCOPD (chronic obstructive pulmonary disease) ([place]   . Coronary artery disease    a. 04/2009 CABG x 3 (LIMA->LAD, VG->OM1, VG->PDA);  b. 09/2009 Cath: occluded VG x 2 w/ patent LIMA and L->R collats. EF 55%, mild antlat HK;  c. 10/2011 MV: EF 53%, no isch/infarct-->low risk.  .Marland KitchenDysrhythmia    hx of a-fib  . GERD (gastroesophageal reflux disease)    occasional  . History of chemotherapy 2015-2016  . HOH (hard of hearing)    Bilateral Hearing Aids  . Hypertension   . Myocardial infarction ([place] 2010  . OSA on CPAP    USE C-PAP  . Presence of permanent cardiac  pacemaker 2017  . PTSD (post-traumatic stress disorder)   . PTSD (post-traumatic stress disorder)   . Pure hypercholesterolemia   . Rheumatic fever 1959    PAST SURGICAL HISTORY :   Past Surgical History:  Procedure Laterality Date  . ABDOMINAL HERNIA REPAIR    . APPENDECTOMY  06/21/1985  . CARDIAC CATHETERIZATION  2010; 2011   ; Dr Fletcher Anon  . CORONARY ARTERY BYPASS GRAFT  04/2009    "CABG X3"  . EP IMPLANTABLE DEVICE N/A 03/03/2016   Procedure: Pacemaker Implant;  Surgeon: Deboraha Sprang, MD;  Location: [facility];  Service: Cardiovascular;  Laterality: N/A;  . FOREIGN BODY REMOVAL  1968   "shrapnel in my tailbone"  . INGUINAL HERNIA REPAIR Right   . INSERT / REPLACE / REMOVE PACEMAKER    . JOINT REPLACEMENT Right 2018  . LAPAROSCOPIC CHOLECYSTECTOMY    . TONSILLECTOMY AND ADENOIDECTOMY  1956  . TOTAL HIP ARTHROPLASTY Right 10/22/2016   Procedure: TOTAL HIP ARTHROPLASTY;  Surgeon: Dereck Leep, MD;  Location: [facility];  Service: Orthopedics;  Laterality: Right;  . TOTAL HIP ARTHROPLASTY Left 11/04/2017   Procedure: TOTAL HIP ARTHROPLASTY;  Surgeon: Dereck Leep, MD;  Location: [facility];  Service: Orthopedics;  Laterality: Left;    FAMILY HISTORY :   Family History  Problem Relation Age of Onset  . Heart disease Mother   . Heart attack Mother   . Coronary artery disease Other        family history    SOCIAL HISTORY:   Social History   Tobacco Use  . Smoking status: Former Smoker    Packs/day: 1.00    Years: 40.00    Pack years: 40.00    Types: Cigarettes    Last attempt to quit: 07/21/2006    Years since quitting: 12.2  . Smokeless tobacco: Never Used  Substance Use Topics  . Alcohol use: Yes    Alcohol/week: 1.0 standard drinks    Types: 1 Standard drinks or equivalent per week    Comment: rarely  . Drug use: No    ALLERGIES:  has No Known Allergies.  MEDICATIONS:  Current Outpatient Medications  Medication Sig Dispense Refill  . acetaminophen (TYLENOL) 500 MG tablet Take 1,000 mg by mouth every 8 (eight) hours as needed for mild pain.    Marland Kitchen albuterol (PROVENTIL HFA;VENTOLIN HFA) 108 (90 Base) MCG/ACT inhaler Inhale 2 puffs into the lungs every 6 (six) hours as needed for wheezing or shortness of breath. 1 Inhaler 11  . amiodarone (PACERONE) 200 MG tablet TAKE 1/2 (ONE-HALF) TABLET BY MOUTH TWICE DAILY 90 tablet 0  . aspirin 81 MG  tablet Take 81 mg by mouth daily.    Marland Kitchen atorvastatin (LIPITOR) 80 MG tablet Take 1 tablet (80 mg total) by mouth at bedtime. 90 tablet 3  . budesonide-formoterol (SYMBICORT) 160-4.5 MCG/ACT inhaler Inhale 2 puffs into the lungs 2 (two) times daily. 1 Inhaler 11  . cetirizine (ZYRTEC) 10 MG tablet Take 10 mg by mouth daily as needed for allergies.     . Coenzyme Q10 (COQ10) 200 MG CAPS Take 200 mg by mouth daily.    Marland Kitchen ezetimibe (ZETIA) 10 MG tablet Take 1 tablet (10 mg total) by mouth daily. 90 tablet 3  . Krill Oil 350 MG CAPS Take 350 mg by mouth every evening.    . metoprolol tartrate (LOPRESSOR) 25 MG tablet Take 25 mg by mouth 2 (two) times daily.    . mirtazapine (REMERON) 15  MG tablet Take 15 mg by mouth at bedtime as needed (for panic associated with PTSD).     . Multiple Vitamin (MULTIVITAMIN WITH MINERALS) TABS tablet Take 1 tablet by mouth daily.    . mupirocin ointment (BACTROBAN) 2 % Apply 1 application topically 3 (three) times daily. 30 g 1  . nitroGLYCERIN (NITROSTAT) 0.4 MG SL tablet Place 1 tablet (0.4 mg total) under the tongue every 5 (five) minutes as needed for chest pain. 25 tablet 6  . tiotropium (SPIRIVA) 18 MCG inhalation capsule Place 1 capsule (18 mcg total) into inhaler and inhale at bedtime. 30 capsule 11  . traMADol (ULTRAM) 50 MG tablet TAKE 1 TABLET BY MOUTH EVERY 12 HOURS AS NEEDED FOR MODERATE PAIN 90 tablet 0  . IMBRUVICA 420 MG TABS TAKE 1 TABLET (420 MG TOTAL) BY MOUTH DAILY. (Patient not taking: Reported on 09/27/2018) 28 tablet 3   No current facility-administered medications for this visit.     PHYSICAL EXAMINATION: ECOG PERFORMANCE STATUS: 1 - Symptomatic but completely ambulatory  BP (!) 157/78 (BP Location: Left Arm, Patient Position: Sitting, Cuff Size: Normal)   Pulse 69   Resp 16   Wt 217 lb 3.2 oz (98.5 kg)   BMI 32.07 kg/m   Filed Weights   09/27/18 1054  Weight: 217 lb 3.2 oz (98.5 kg)    Physical Exam  Constitutional: He is oriented to  person, place, and time and well-developed, well-nourished, and in no distress.    Accompanied by his daughter.  HENT:  Head: Normocephalic and atraumatic.  Mouth/Throat: Oropharynx is clear and moist. No oropharyngeal exudate.  Eyes: Pupils are equal, round, and reactive to light.  Neck: Normal range of motion. Neck supple.  Cardiovascular: Normal rate and regular rhythm.  Pulmonary/Chest: No respiratory distress. He has no wheezes.  Abdominal: Soft. Bowel sounds are normal. He exhibits no distension and no mass. There is no abdominal tenderness. There is no rebound and no guarding.  Musculoskeletal: Normal range of motion.        General: No tenderness or edema.  Lymphadenopathy:  Resolved lymph nodes in the neck underarms.  Neurological: He is alert and oriented to person, place, and time.  Skin: Skin is warm.  Psychiatric: Affect normal.       LABORATORY DATA:  I have reviewed the data as listed    Component Value Date/Time   NA 142 08/05/2018 0858   NA 139 10/11/2014 1800   K 3.9 08/05/2018 0858   K 3.3 (L) 10/11/2014 1800   CL 108 08/05/2018 0858   CL 106 10/11/2014 1800   CO2 26 08/05/2018 0858   CO2 27 10/11/2014 1800   GLUCOSE 114 (H) 08/05/2018 0858   GLUCOSE 107 (H) 10/11/2014 1800   BUN 20 08/05/2018 0858   BUN 15 10/11/2014 1800   CREATININE 0.90 09/23/2018 0937   CREATININE 0.89 10/11/2014 1800   CALCIUM 8.7 (L) 08/05/2018 0858   CALCIUM 8.8 (L) 10/11/2014 1800   PROT 6.8 08/05/2018 0858   PROT 6.7 05/18/2017 1048   PROT 6.4 (L) 10/11/2014 1800   ALBUMIN 3.9 08/05/2018 0858   ALBUMIN 4.3 05/18/2017 1048   ALBUMIN 4.1 10/11/2014 1800   AST 20 08/05/2018 0858   AST 23 10/11/2014 1800   ALT 22 08/05/2018 0858   ALT 22 10/11/2014 1800   ALKPHOS 91 08/05/2018 0858   ALKPHOS 61 10/11/2014 1800   BILITOT 1.2 08/05/2018 0858   BILITOT 0.7 05/18/2017 1048   BILITOT 0.9 10/11/2014 1800  GFRNONAA >60 08/05/2018 0858   GFRNONAA >60 10/11/2014 1800   GFRAA  >60 08/05/2018 0858   GFRAA >60 10/11/2014 1800    No results found for: SPEP, UPEP  Lab Results  Component Value Date   WBC 149.4 (HH) 09/16/2018   NEUTROABS 6.4 09/16/2018   HGB 12.0 (L) 09/16/2018   HCT 39.7 09/16/2018   MCV 100.0 09/16/2018   PLT 141 (L) 09/16/2018      Chemistry      Component Value Date/Time   NA 142 08/05/2018 0858   NA 139 10/11/2014 1800   K 3.9 08/05/2018 0858   K 3.3 (L) 10/11/2014 1800   CL 108 08/05/2018 0858   CL 106 10/11/2014 1800   CO2 26 08/05/2018 0858   CO2 27 10/11/2014 1800   BUN 20 08/05/2018 0858   BUN 15 10/11/2014 1800   CREATININE 0.90 09/23/2018 0937   CREATININE 0.89 10/11/2014 1800      Component Value Date/Time   CALCIUM 8.7 (L) 08/05/2018 0858   CALCIUM 8.8 (L) 10/11/2014 1800   ALKPHOS 91 08/05/2018 0858   ALKPHOS 61 10/11/2014 1800   AST 20 08/05/2018 0858   AST 23 10/11/2014 1800   ALT 22 08/05/2018 0858   ALT 22 10/11/2014 1800   BILITOT 1.2 08/05/2018 0858   BILITOT 0.7 05/18/2017 1048   BILITOT 0.9 10/11/2014 1800       RADIOGRAPHIC STUDIES: I have personally reviewed the radiological images as listed and agreed with the findings in the report. No results found.   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  Currently on hold because of intolerance.  March 2020 CT scan shows significant improvement of adenopathy chest axilla/abdomen.   #Recommend continued holding of ibrutinib given the significant myalgias fatigue joint pains.  Discussed using venetoclax at the next option.  #I had a long discussion regarding starting the patient on venetoclax.  Given the low white count/lymph node mass less than 5 cm patient is candidate for outpatient treatment.  Discussed the risk of tumor lysis syndrome; and strict need to follow guidelines regarding testing for tumor lysis.  Discussed that duration of treatment is 2 years.   And is a good chance that patient would likely go into remission; not needing ongoing therapy.  # #Tumor lysis prophylaxis -start patient on allopurinol discussed the potential rash.  Discussed regarding IV fluids/the day of venetoclax administration.  # A.fib-on amiodarone sinus rhythm;-Restart Eliquis. STOP aspirin.  Interaction of amio with venatoclax. Will check with cardiology.   # Hypertension-systolic [phone]. Stable.   # DISPOSITION: # follow up in 3 weeks MD/labs- cbc/cmp/ldh/mag/phos/uric acid-Dr.B   Orders Placed This Encounter  Procedures  . CBC with Differential/Platelet    Standing Status:   Future    Standing Expiration Date:   09/27/2019  . Comprehensive metabolic panel    Standing Status:   Future    Standing Expiration Date:   09/27/2019  . Lactate dehydrogenase    Standing Status:   Future    Standing Expiration Date:   09/27/2019  . Magnesium    Standing Status:   Future    Standing Expiration Date:   09/27/2019  . Phosphorus    Standing Status:   Future    Standing Expiration Date:   09/27/2019  . Uric acid    Standing Status:   Future    Standing Expiration Date:   09/27/2019   All questions were answered. The  patient knows to call the clinic with any problems, questions or concerns.      Cammie Sickle, MD 10/04/2018 12:22 PM

## 2018-10-04 MED ORDER — ALLOPURINOL 300 MG PO TABS: 300.0000 mg | ORAL_TABLET | Freq: Two times a day (BID) | ORAL | 0 refills | Status: DC

## 2018-10-10 ENCOUNTER — Other Ambulatory Visit: Payer: Self-pay

## 2018-10-11 MED ORDER — AMIODARONE HCL 200 MG PO TABS: ORAL_TABLET | ORAL | 2 refills | Status: DC

## 2018-10-12 DIAGNOSIS — G4733 Obstructive sleep apnea (adult) (pediatric): Secondary | ICD-10-CM | POA: Diagnosis not present

## 2018-10-15 ENCOUNTER — Other Ambulatory Visit: Payer: Self-pay

## 2018-10-18 ENCOUNTER — Other Ambulatory Visit: Payer: Self-pay

## 2018-10-18 ENCOUNTER — Inpatient Hospital Stay (HOSPITAL_BASED_OUTPATIENT_CLINIC_OR_DEPARTMENT_OTHER): Payer: Medicare HMO | Admitting: Internal Medicine

## 2018-10-18 ENCOUNTER — Encounter: Payer: Self-pay | Admitting: Internal Medicine

## 2018-10-18 ENCOUNTER — Inpatient Hospital Stay: Payer: Medicare HMO

## 2018-10-18 DIAGNOSIS — M2550 Pain in unspecified joint: Secondary | ICD-10-CM | POA: Diagnosis not present

## 2018-10-18 DIAGNOSIS — C9110 Chronic lymphocytic leukemia of B-cell type not having achieved remission: Secondary | ICD-10-CM

## 2018-10-18 DIAGNOSIS — Z87891 Personal history of nicotine dependence: Secondary | ICD-10-CM | POA: Diagnosis not present

## 2018-10-18 DIAGNOSIS — I4891 Unspecified atrial fibrillation: Secondary | ICD-10-CM | POA: Diagnosis not present

## 2018-10-18 DIAGNOSIS — I10 Essential (primary) hypertension: Secondary | ICD-10-CM | POA: Diagnosis not present

## 2018-10-18 DIAGNOSIS — Z7901 Long term (current) use of anticoagulants: Secondary | ICD-10-CM | POA: Diagnosis not present

## 2018-10-18 DIAGNOSIS — R5383 Other fatigue: Secondary | ICD-10-CM | POA: Diagnosis not present

## 2018-10-18 LAB — CBC WITH DIFFERENTIAL/PLATELET
Abs Immature Granulocytes: 0.04 10*3/uL (ref 0.00–0.07)
Basophils Absolute: 0.1 10*3/uL (ref 0.0–0.1)
Basophils Relative: 0 %
Eosinophils Absolute: 0.2 10*3/uL (ref 0.0–0.5)
Eosinophils Relative: 1 %
HCT: 37.4 % — ABNORMAL LOW (ref 39.0–52.0)
HEMOGLOBIN: 11.9 g/dL — AB (ref 13.0–17.0)
IMMATURE GRANULOCYTES: 0 %
LYMPHS ABS: 32 10*3/uL — AB (ref 0.7–4.0)
Lymphocytes Relative: 83 %
MCH: 31.5 pg (ref 26.0–34.0)
MCHC: 31.8 g/dL (ref 30.0–36.0)
MCV: 98.9 fL (ref 80.0–100.0)
Monocytes Absolute: 2.2 10*3/uL — ABNORMAL HIGH (ref 0.1–1.0)
Monocytes Relative: 6 %
Neutro Abs: 3.7 10*3/uL (ref 1.7–7.7)
Neutrophils Relative %: 10 %
Platelets: 115 10*3/uL — ABNORMAL LOW (ref 150–400)
RBC: 3.78 MIL/uL — ABNORMAL LOW (ref 4.22–5.81)
RDW: 16 % — ABNORMAL HIGH (ref 11.5–15.5)
Smear Review: NORMAL
WBC: 38.2 10*3/uL — ABNORMAL HIGH (ref 4.0–10.5)
nRBC: 0 % (ref 0.0–0.2)

## 2018-10-18 LAB — COMPREHENSIVE METABOLIC PANEL
ALT: 20 U/L (ref 0–44)
AST: 18 U/L (ref 15–41)
Albumin: 3.8 g/dL (ref 3.5–5.0)
Alkaline Phosphatase: 98 U/L (ref 38–126)
Anion gap: 6 (ref 5–15)
BUN: 26 mg/dL — AB (ref 8–23)
CO2: 25 mmol/L (ref 22–32)
Calcium: 8.7 mg/dL — ABNORMAL LOW (ref 8.9–10.3)
Chloride: 110 mmol/L (ref 98–111)
Creatinine, Ser: 1.13 mg/dL (ref 0.61–1.24)
GFR calc Af Amer: >60 mL/min (ref >60)
GFR calc non Af Amer: >60 mL/min (ref >60)
Glucose, Bld: 122 mg/dL — ABNORMAL HIGH (ref 70–99)
Potassium: 4 mmol/L (ref 3.5–5.1)
SODIUM: 141 mmol/L (ref 135–145)
Total Bilirubin: 0.8 mg/dL (ref 0.3–1.2)
Total Protein: 6.6 g/dL (ref 6.5–8.1)

## 2018-10-18 LAB — URIC ACID: Uric Acid, Serum: 2.6 mg/dL — ABNORMAL LOW (ref 3.7–8.6)

## 2018-10-18 LAB — LACTATE DEHYDROGENASE: LDH: 149 U/L (ref 98–192)

## 2018-10-18 LAB — MAGNESIUM: Magnesium: 2.2 mg/dL (ref 1.7–2.4)

## 2018-10-18 LAB — PHOSPHORUS: Phosphorus: 3.7 mg/dL (ref 2.5–4.6)

## 2018-10-18 MED ORDER — IBRUTINIB 280 MG PO TABS: 280.0000 mg | ORAL_TABLET | Freq: Every day | ORAL | 4 refills | Status: DC

## 2018-10-18 NOTE — Progress Notes (Signed)
I connected with [name] on 10/18/18 at  2:15 PM EDTby telephone and verified that I am speaking with the patient using 2 identifiers.  # LOCATION:  Patient: home Provider: office  I discussed the limitations, risks, security and privacy concerns of performing an evaluation and management service by telephone and the availability of in person appointments.  I also discussed with the patient that there may be a patient responsible charge related to the service.  The patient expressed understanding and agrees to proceed.  History of present illness: 73 year old male patient with history of CLL currently off ibrutinib secondary intolerance.  Patient feels significantly improved since coming off ibrutinib.  Fatigue improved.  Energy levels improving.  No new lumps or bumps.  Observation/objective: Labs October 18, 2018-white count 38,000/hemoglobin 11.9 platelets 112.  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  Currently on hold because of intolerance.  March 2020 CT scan shows significant improvement of adenopathy chest axilla/abdomen.   #Currently off ibrutinib-white count shows significant improvement at 40,000/stable hemoglobin platelets-112.  #Recommend restarting ibrutinib at 280 mg a day/in approximately 2 weeks from now.  Recommend holding of venetoclax because of coronavirus pandemic.  # #Tumor lysis prophylaxis -continue allopurinol.  # A.fib-on amiodarone sinus rhythm-on Eliquis.  # DISPOSITION: # follow up in 2 weeks-TELE MD/labs- cbc/bmp/LDH-Dr.B      I discussed the assessment and treatment plan with the patient.  The patient was provided an opportunity to ask questions and all were answered.  The patient agreed with the plan and demonstrated understanding of instructions.  The patient was advised to call back or seek an in person evaluation if the symptoms worsen or if  the condition fails to improve as anticipated.  I provided 15 minutes of non-face-to-face time during this encounter  Visit Diagnosis: 1. CLL (chronic lymphocytic leukemia) ([place])     Dr. Charlaine Dalton [place] at [facility] 10/18/2018 11:52 AM

## 2018-10-18 NOTE — Assessment & Plan Note (Addendum)
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  Currently on hold because of intolerance.  March 2020 CT scan shows significant improvement of adenopathy chest axilla/abdomen.   #Currently off ibrutinib-white count shows significant improvement at 40,000/stable hemoglobin platelets-112.  #Recommend restarting ibrutinib at 280 mg a day/in approximately 2 weeks from now.  Recommend holding of venetoclax because of coronavirus pandemic.  # #Tumor lysis prophylaxis -continue allopurinol.  # A.fib-on amiodarone sinus rhythm-on Eliquis.  # DISPOSITION: # follow up in 2 weeks-TELE MD/labs- cbc/bmp/LDH-Dr.B

## 2018-10-18 NOTE — Addendum Note (Signed)
Addended by: Sandria Bales B on: 10/18/2018 12:24 PM   Modules accepted: Orders

## 2018-10-20 ENCOUNTER — Telehealth: Payer: Self-pay | Admitting: Pharmacist

## 2018-10-20 NOTE — Telephone Encounter (Signed)
Oral Chemotherapy Pharmacist Encounter   Patient is to restart his Imbruvica at a lower dose at his MD appt on 11/01/18. [place] will have his Imbuvica delivered to him on 4/7. Mr. Seda knows when to resume his Imbruvica and when to expect the shipment.  Darl Pikes, PharmD, BCPS, [facility] Hematology/Oncology Clinical Pharmacist [facility] [phone]  10/20/2018 3:07 PM

## 2018-10-25 MED FILL — IMBRUVICA 280 MG TAB: 280 | 28 days supply | Qty: 28 | Fill #0

## 2018-10-31 ENCOUNTER — Other Ambulatory Visit: Payer: Self-pay

## 2018-11-01 ENCOUNTER — Inpatient Hospital Stay: Payer: Medicare HMO

## 2018-11-01 ENCOUNTER — Other Ambulatory Visit: Payer: Self-pay | Admitting: *Deleted

## 2018-11-01 ENCOUNTER — Inpatient Hospital Stay: Payer: Medicare HMO | Attending: Internal Medicine | Admitting: Internal Medicine

## 2018-11-01 ENCOUNTER — Other Ambulatory Visit: Payer: Self-pay

## 2018-11-01 DIAGNOSIS — C9110 Chronic lymphocytic leukemia of B-cell type not having achieved remission: Secondary | ICD-10-CM

## 2018-11-01 DIAGNOSIS — Z79899 Other long term (current) drug therapy: Secondary | ICD-10-CM | POA: Diagnosis not present

## 2018-11-01 LAB — CBC WITH DIFFERENTIAL/PLATELET
Abs Immature Granulocytes: 0.05 10*3/uL (ref 0.00–0.07)
Basophils Absolute: 0.1 10*3/uL (ref 0.0–0.1)
Basophils Relative: 0 %
Eosinophils Absolute: 0.2 10*3/uL (ref 0.0–0.5)
Eosinophils Relative: 1 %
HCT: 35.4 % — ABNORMAL LOW (ref 39.0–52.0)
Hemoglobin: 11.4 g/dL — ABNORMAL LOW (ref 13.0–17.0)
Immature Granulocytes: 0 %
Lymphocytes Relative: 74 %
Lymphs Abs: 19 10*3/uL — ABNORMAL HIGH (ref 0.7–4.0)
MCH: 31.8 pg (ref 26.0–34.0)
MCHC: 32.2 g/dL (ref 30.0–36.0)
MCV: 98.6 fL (ref 80.0–100.0)
Monocytes Absolute: 2.8 10*3/uL — ABNORMAL HIGH (ref 0.1–1.0)
Monocytes Relative: 11 %
Neutro Abs: 3.5 10*3/uL (ref 1.7–7.7)
Neutrophils Relative %: 14 %
Platelets: 118 10*3/uL — ABNORMAL LOW (ref 150–400)
RBC: 3.59 MIL/uL — ABNORMAL LOW (ref 4.22–5.81)
RDW: 16.3 % — ABNORMAL HIGH (ref 11.5–15.5)
Smear Review: NORMAL
WBC: 25.6 10*3/uL — ABNORMAL HIGH (ref 4.0–10.5)
nRBC: 0 % (ref 0.0–0.2)

## 2018-11-01 LAB — COMPREHENSIVE METABOLIC PANEL
ALT: 28 U/L (ref 0–44)
AST: 25 U/L (ref 15–41)
Albumin: 3.8 g/dL (ref 3.5–5.0)
Alkaline Phosphatase: 91 U/L (ref 38–126)
Anion gap: 5 (ref 5–15)
BUN: 19 mg/dL (ref 8–23)
CO2: 25 mmol/L (ref 22–32)
Calcium: 8.2 mg/dL — ABNORMAL LOW (ref 8.9–10.3)
Chloride: 109 mmol/L (ref 98–111)
Creatinine, Ser: 1.15 mg/dL (ref 0.61–1.24)
GFR calc Af Amer: >60 mL/min (ref >60)
GFR calc non Af Amer: >60 mL/min (ref >60)
Glucose, Bld: 124 mg/dL — ABNORMAL HIGH (ref 70–99)
Potassium: 3.8 mmol/L (ref 3.5–5.1)
Sodium: 139 mmol/L (ref 135–145)
Total Bilirubin: 0.7 mg/dL (ref 0.3–1.2)
Total Protein: 6.5 g/dL (ref 6.5–8.1)

## 2018-11-01 LAB — LACTATE DEHYDROGENASE: LDH: 151 U/L (ref 98–192)

## 2018-11-01 NOTE — Assessment & Plan Note (Signed)
#  Recurrent CLL [facility] unmutated/p53/deletion-11]-March 2020 CT scan shows significant improvement of adenopathy chest axilla/abdomen.   #Currently off ibrutinib x 1 month- today white count-25; stable hemoglobin platelets-118.   # START ibrutinib at 280 mg a day; patient has new prescription available.  # Tumor lysis prophylaxis -STOP allopurinol; as patient is currently not going on venetoclax.  # A.fib-on amiodarone sinus rhythm-on Eliquis.   # DISPOSITION: # follow up in 3 weeks-TELE MD/labs- cbc/bmp/LDH-Dr.B

## 2018-11-01 NOTE — Progress Notes (Signed)
I connected with [name] on 11/01/18 at  2:15 PM EDT by telephone visit and verified that I am speaking with the correct person using two identifiers.  I discussed the limitations, risks, security and privacy concerns of performing an evaluation and management service by telemedicine and the availability of in-person appointments. I also discussed with the patient that there may be a patient responsible charge related to this service. The patient expressed understanding and agreed to proceed.    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  MARCH 2017- CT N/C/A/P- NED. STOP Ritux; surveillance   # AUG 2019- CT/PET- progression/NO transformation;  # NOV 2019- Progression; started Ibrutinib 420 mg/d. STOPPED in end of feb sec to extreme fatigue/joint pains/cramps  #November 01, 2018-start ibrutinib 280 mg a day   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; IGVH- UN-MUTATED [poor prognosis]  DIAGNOSIS: CLL  STAGE: IV  ;GOALS: control  CURRENT/MOST RECENT THERAPY : Ibrutinib on HOLD      CLL (chronic lymphocytic leukemia) ([place]     Chief Complaint: CLL   History of present illness:[name] 73y.o.  male with history of CLL currently off ibrutinib.  Patient currently feels improved.  His joint pains back pain muscle aches improved.  No nausea no vomiting.  He feels he is back to his baseline.  He has been off ibrutinib for about 6 to 8 weeks.  Observation/objective: White count 25 hemoglobin 11 platelets 118.  Assessment and plan: CLL  (chronic lymphocytic leukemia) ([place] # [facility]March 2020 CT scan shows significant improvement of adenopathy chest axilla/abdomen.   #Currently off ibrutinib x 1 month- today white count-25; stable hemoglobin platelets-118.   # START ibrutinib at 280 mg a day; patient has new prescription available.  # Tumor lysis prophylaxis -STOP allopurinol; as patient is currently not going on venetoclax.  # A.fib-on amiodarone sinus rhythm-on Eliquis.   # DISPOSITION: # follow up in 3 weeks-TELE MD/labs- cbc/bmp/LDH-Dr.B     Follow-up instructions:  I discussed the assessment and treatment plan with the patient.  The patient was provided an opportunity to ask questions and all were answered.  The patient agreed with the plan and demonstrated understanding of instructions.  The patient was advised to call back or seek an in person evaluation if the symptoms worsen or if the condition fails to improve as anticipated.  I provided 12 minutes of non face-to-face telephone visit time during this encounter, and > 50% was spent counseling as documented under my assessment & plan.   [facility]/13/2020 1:42 PM

## 2018-11-01 NOTE — Progress Notes (Signed)
Patient contacted for telehealth apts today by RN. Medications reconciled. Patient is currently off the [place]. He stated that Dr. Jacinto Reap will tell him when to start back on the medication.

## 2018-11-12 ENCOUNTER — Ambulatory Visit (INDEPENDENT_AMBULATORY_CARE_PROVIDER_SITE_OTHER): Payer: Medicare HMO | Admitting: Family Medicine

## 2018-11-12 ENCOUNTER — Encounter: Payer: Self-pay | Admitting: Family Medicine

## 2018-11-12 ENCOUNTER — Other Ambulatory Visit: Payer: Self-pay

## 2018-11-12 DIAGNOSIS — C9110 Chronic lymphocytic leukemia of B-cell type not having achieved remission: Secondary | ICD-10-CM | POA: Diagnosis not present

## 2018-11-12 DIAGNOSIS — I10 Essential (primary) hypertension: Secondary | ICD-10-CM | POA: Diagnosis not present

## 2018-11-12 DIAGNOSIS — E782 Mixed hyperlipidemia: Secondary | ICD-10-CM | POA: Diagnosis not present

## 2018-11-12 DIAGNOSIS — J432 Centrilobular emphysema: Secondary | ICD-10-CM

## 2018-11-12 DIAGNOSIS — I480 Paroxysmal atrial fibrillation: Secondary | ICD-10-CM | POA: Diagnosis not present

## 2018-11-12 NOTE — Progress Notes (Signed)
Virtual Visit via telephone note  This visit type was conducted due to national recommendations for restrictions regarding the COVID-19 pandemic (e.g. social distancing).  This format is felt to be most appropriate for this patient at this time.  All issues noted in this document were discussed and addressed.  No physical exam was performed (except for noted visual exam findings with Video Visits).   I connected with [name] on 11/13/18 at  1:45 PM EDT by telephone and verified that I am speaking with the correct person using two identifiers. Location patient: home Location provider: work Persons participating in the virtual visit: patient, provider, wife  I discussed the limitations, risks, security and privacy concerns of performing an evaluation and management service by telephone and the availability of in person appointments. I also discussed with the patient that there may be a patient responsible charge related to this service. The patient expressed understanding and agreed to proceed.  Interactive audio and video telecommunications were attempted between this provider and patient, however failed, due to patient having technical difficulties OR patient did not have access to video capability.  We continued and completed visit with audio only.  Reason for visit: Follow-up  HPI: Hypertension/A. fib: Typically running 120s over 60s.  Taking metoprolol.  No palpitations.  He is taking Eliquis.  No bleeding issues.  No chest pain.  Hyperlipidemia: Taking Lipitor and Zetia.  No chest pain or abdominal pain.  COPD: Taking Spiriva and Symbicort.  He notes no cough or wheezing.  He has chronic stable dyspnea.  This has been unchanged.  CLL: He has been following with oncology.  They are looking at changing his medications.  They did place him on allopurinol.   ROS: See pertinent positives and negatives per HPI.  Past Medical History:  Diagnosis Date  . Anxiety   . Arthritis   .  Atrial fibrillation ([place])    a. Dx 2013, recurred 02/2014, CHA2DS2VASc = 3 -->placed on Eliquis;  b. 02/2014 Echo: EF 50-55%, mid and apical anterior septum and mid and apical inf septum are abnl, mild to mod Ao sclerosis w/o AS.  Marland Kitchen Chicken pox   . Chronic lymphocytic leukemia ([place])    a. Dx 02/2014.  Marland Kitchen Complication of anesthesia    History of  PTSD--do not touch patient when waking up from surgery.  Marland Kitchen COPD (chronic obstructive pulmonary disease) ([place])   . Coronary artery disease    a. 04/2009 CABG x 3 (LIMA->LAD, VG->OM1, VG->PDA);  b. 09/2009 Cath: occluded VG x 2 w/ patent LIMA and L->R collats. EF 55%, mild antlat HK;  c. 10/2011 MV: EF 53%, no isch/infarct-->low risk.  Marland Kitchen Dysrhythmia    hx of a-fib  . GERD (gastroesophageal reflux disease)    occasional  . History of chemotherapy 2015-2016  . HOH (hard of hearing)    Bilateral Hearing Aids  . Hypertension   . Myocardial infarction ([place]) 2010  . OSA on CPAP    USE C-PAP  . Presence of permanent cardiac pacemaker 2017  . PTSD (post-traumatic stress disorder)   . PTSD (post-traumatic stress disorder)   . Pure hypercholesterolemia   . Rheumatic fever 1959  . 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 ABDOMINAL HERNIA REPAIR    . APPENDECTOMY  06/21/1985  . CARDIAC CATHETERIZATION  2010; 2011   ; Dr Fletcher Anon  . CORONARY ARTERY BYPASS GRAFT  04/2009   "CABG X3"  .  EP IMPLANTABLE DEVICE N/A 03/03/2016   Procedure: Pacemaker Implant;  Surgeon: Deboraha Sprang, MD;  Location: [facility];  Service: Cardiovascular;  Laterality: N/A;  . FOREIGN BODY REMOVAL  1968   "shrapnel in my tailbone"  . INGUINAL HERNIA REPAIR Right   . INSERT / REPLACE / REMOVE PACEMAKER    . JOINT REPLACEMENT Right 2018  . LAPAROSCOPIC CHOLECYSTECTOMY    . TONSILLECTOMY AND ADENOIDECTOMY  1956  . TOTAL HIP ARTHROPLASTY Right 10/22/2016   Procedure: TOTAL HIP ARTHROPLASTY;  Surgeon: Dereck Leep, MD;  Location: [facility];  Service: Orthopedics;  Laterality: Right;  . TOTAL HIP ARTHROPLASTY Left 11/04/2017   Procedure: TOTAL HIP ARTHROPLASTY;  Surgeon: Dereck Leep, MD;  Location: [facility];  Service: Orthopedics;  Laterality: Left;    Family History  Problem Relation Age of Onset  . Heart disease Mother   . Heart attack Mother   . Coronary artery disease Other        family history    SOCIAL HX: Former smoker.   Current Outpatient Medications:  .  acetaminophen (TYLENOL) 500 MG tablet, Take 1,000 mg by mouth every 8 (eight) hours as needed for mild pain., Disp: , Rfl:  .  albuterol (PROVENTIL HFA;VENTOLIN HFA) 108 (90 Base) MCG/ACT inhaler, Inhale 2 puffs into the lungs every 6 (six) hours as needed for wheezing or shortness of breath., Disp: 1 Inhaler, Rfl: 11 .  allopurinol (ZYLOPRIM) 300 MG tablet, Take 1 tablet (300 mg total) by mouth 2 (two) times daily., Disp: 120 tablet, Rfl: 0 .  amiodarone (PACERONE) 200 MG tablet, TAKE 1/2 TABLET (100 mg)  BY MOUTH TWICE DAILY, Disp: 90 tablet, Rfl: 2 .  apixaban (ELIQUIS) 5 MG TABS tablet, Take 5 mg by mouth 2 (two) times daily., Disp: , Rfl:  .  atorvastatin (LIPITOR) 80 MG tablet, Take 1 tablet (80 mg total) by mouth at bedtime., Disp: 90 tablet, Rfl: 3 .  budesonide-formoterol (SYMBICORT) 160-4.5 MCG/ACT inhaler, Inhale 2 puffs into the lungs 2 (two) times daily., Disp: 1 Inhaler, Rfl: 11 .  cetirizine (ZYRTEC) 10 MG tablet, Take 10 mg by mouth daily as needed for allergies. , Disp: , Rfl:  .  Coenzyme Q10 (COQ10) 200 MG CAPS, Take 200 mg by mouth daily., Disp: , Rfl:  .  ezetimibe (ZETIA) 10 MG tablet, Take 1 tablet (10 mg total) by mouth daily., Disp: 90 tablet, Rfl: 3 .  Ibrutinib (IMBRUVICA) 280 MG TABS, Take 280 mg by mouth daily., Disp: 30 tablet, Rfl: 4 .  Krill Oil 350 MG CAPS, Take 350 mg by mouth every evening., Disp: , Rfl:  .  metoprolol tartrate (LOPRESSOR) 25 MG tablet, Take 25 mg by mouth 2 (two) times daily.,  Disp: , Rfl:  .  mirtazapine (REMERON) 15 MG tablet, Take 15 mg by mouth at bedtime as needed (for panic associated with PTSD). , Disp: , Rfl:  .  Multiple Vitamin (MULTIVITAMIN WITH MINERALS) TABS tablet, Take 1 tablet by mouth daily., Disp: , Rfl:  .  mupirocin ointment (BACTROBAN) 2 %, Apply 1 application topically 3 (three) times daily., Disp: 30 g, Rfl: 1 .  nitroGLYCERIN (NITROSTAT) 0.4 MG SL tablet, Place 1 tablet (0.4 mg total) under the tongue every 5 (five) minutes as needed for chest pain., Disp: 25 tablet, Rfl: 6 .  tiotropium (SPIRIVA) 18 MCG inhalation capsule, Place 1 capsule (18 mcg total) into inhaler and inhale at bedtime., Disp: 30 capsule, Rfl: 11 .  traMADol (  ULTRAM) 50 MG tablet, TAKE 1 TABLET BY MOUTH EVERY 12 HOURS AS NEEDED FOR MODERATE PAIN, Disp: 90 tablet, Rfl: 0  EXAM: This was a telehealth telephone visit and thus no physical exam was completed.  ASSESSMENT AND PLAN:  Discussed the following assessment and plan:  Essential hypertension  Paroxysmal atrial fibrillation (HCC)  CLL (chronic lymphocytic leukemia) (HCC)  Centrilobular emphysema (HCC)  Mixed hyperlipidemia - Plan: Lipid panel  Essential hypertension Adequately controlled.  He will continue metoprolol.  Paroxysmal atrial fibrillation (HCC) Asymptomatic.  He will continue metoprolol and Eliquis.  CLL (chronic lymphocytic leukemia) ([place]) He will continue to see oncology.  COPD (chronic obstructive pulmonary disease) Stable on his current regimen.  He will continue this.  Hyperlipidemia We will have our lab staff check with the cancer center to see if they will draw a lipid panel for [place].  Continue current regimen.    I discussed the assessment and treatment plan with the patient. The patient was provided an opportunity to ask questions and all were answered. The patient agreed with the plan and demonstrated an understanding of the instructions.   The patient was advised to call back or  seek an in-person evaluation if the symptoms worsen or if the condition fails to improve as anticipated.  I provided 15 minutes of non-face-to-face time during this encounter.   [name], MD

## 2018-11-13 ENCOUNTER — Encounter: Payer: Self-pay | Admitting: Family Medicine

## 2018-11-13 ENCOUNTER — Telehealth: Payer: Self-pay | Admitting: Family Medicine

## 2018-11-13 NOTE — Assessment & Plan Note (Signed)
Asymptomatic.  He will continue metoprolol and Eliquis.

## 2018-11-13 NOTE — Assessment & Plan Note (Signed)
He will continue to see oncology.

## 2018-11-13 NOTE — Assessment & Plan Note (Addendum)
We will have our lab staff check with the cancer center to see if they will draw a lipid panel for [place].  Continue current regimen.

## 2018-11-13 NOTE — Assessment & Plan Note (Signed)
Stable on his current regimen.  He will continue this.

## 2018-11-13 NOTE — Assessment & Plan Note (Signed)
Adequately controlled.  He will continue metoprolol.

## 2018-11-13 NOTE — Telephone Encounter (Signed)
Wilburn Cornelia, can you contact the patient and get him set up for follow-up in the office in 6 months?  Carolee Rota or [place], can you contact the cancer center to see if they would be willing to draw a lipid panel with his upcoming labs?  I have placed an order for this.  Thanks.

## 2018-11-15 ENCOUNTER — Other Ambulatory Visit: Payer: Self-pay

## 2018-11-15 DIAGNOSIS — E782 Mixed hyperlipidemia: Secondary | ICD-10-CM

## 2018-11-15 NOTE — Telephone Encounter (Signed)
Called the cancer center call back number for lab is [phone] - Spoke with Luna Glasgow stated that Dr. Caryl Bis can go ahead and place the orders and he will send a message to the cancer doctor to make them aware.   Since PCP is out I can place the order just make it future?

## 2018-11-15 NOTE — Telephone Encounter (Signed)
I don't have it but I just googled it for you. [phone]

## 2018-11-15 NOTE — Telephone Encounter (Signed)
I am happy to do this do you happen to know their phone number?

## 2018-11-15 NOTE — Telephone Encounter (Signed)
Correct, & choose [place] as the location (not [place])

## 2018-11-15 NOTE — Telephone Encounter (Signed)
Orders have been placed sent to PCP as an [place]

## 2018-11-15 NOTE — Telephone Encounter (Signed)
CMA usually contacts cancer center to arrange this. I will help if needed.

## 2018-11-15 NOTE — Telephone Encounter (Signed)
Pt has already been scheduled for a 6 month follow up in October

## 2018-11-16 ENCOUNTER — Other Ambulatory Visit: Payer: Self-pay

## 2018-11-16 ENCOUNTER — Encounter: Payer: Medicare HMO | Admitting: *Deleted

## 2018-11-16 DIAGNOSIS — Z96653 Presence of artificial knee joint, bilateral: Secondary | ICD-10-CM | POA: Diagnosis not present

## 2018-11-16 DIAGNOSIS — M1612 Unilateral primary osteoarthritis, left hip: Secondary | ICD-10-CM | POA: Diagnosis not present

## 2018-11-17 ENCOUNTER — Telehealth: Payer: Self-pay

## 2018-11-17 ENCOUNTER — Telehealth: Payer: Self-pay | Admitting: Cardiology

## 2018-11-17 NOTE — Telephone Encounter (Signed)
New Message:   Pt said he tried to transmit yesterday, but it did not go through. He will try again today.

## 2018-11-17 NOTE — Telephone Encounter (Signed)
I spoke with pt and tried to help him send a transmission but was unsuccessful. I gave him the number to [place] for further help.

## 2018-11-17 NOTE — Telephone Encounter (Signed)
Spoke with patient to remind of missed remote transmission 

## 2018-11-19 ENCOUNTER — Other Ambulatory Visit: Payer: Self-pay

## 2018-11-19 ENCOUNTER — Telehealth: Payer: Self-pay

## 2018-11-19 NOTE — Telephone Encounter (Signed)
The pt having a hard time getting a person on the phone to help him troubleshoot the monitor. I told him I will have someone from [employer] give him a call. It may take 3 business days for them to call him back. He was ok with that.

## 2018-11-20 MED FILL — IMBRUVICA 280 MG TAB: 280 | 28 days supply | Qty: 28 | Fill #1

## 2018-11-21 ENCOUNTER — Other Ambulatory Visit: Payer: Self-pay

## 2018-11-22 ENCOUNTER — Other Ambulatory Visit: Payer: Self-pay

## 2018-11-22 ENCOUNTER — Inpatient Hospital Stay (HOSPITAL_BASED_OUTPATIENT_CLINIC_OR_DEPARTMENT_OTHER): Payer: Medicare HMO | Admitting: Internal Medicine

## 2018-11-22 ENCOUNTER — Inpatient Hospital Stay: Payer: Medicare HMO | Attending: Internal Medicine

## 2018-11-22 ENCOUNTER — Encounter: Payer: Self-pay | Admitting: Internal Medicine

## 2018-11-22 DIAGNOSIS — I4891 Unspecified atrial fibrillation: Secondary | ICD-10-CM

## 2018-11-22 DIAGNOSIS — Z7901 Long term (current) use of anticoagulants: Secondary | ICD-10-CM | POA: Diagnosis not present

## 2018-11-22 DIAGNOSIS — Z9221 Personal history of antineoplastic chemotherapy: Secondary | ICD-10-CM | POA: Diagnosis not present

## 2018-11-22 DIAGNOSIS — C9110 Chronic lymphocytic leukemia of B-cell type not having achieved remission: Secondary | ICD-10-CM | POA: Insufficient documentation

## 2018-11-22 DIAGNOSIS — E782 Mixed hyperlipidemia: Secondary | ICD-10-CM

## 2018-11-22 DIAGNOSIS — I10 Essential (primary) hypertension: Secondary | ICD-10-CM | POA: Diagnosis not present

## 2018-11-22 LAB — CBC WITH DIFFERENTIAL/PLATELET
Abs Immature Granulocytes: 0 10*3/uL (ref 0.00–0.07)
Basophils Absolute: 0 10*3/uL (ref 0.0–0.1)
Basophils Relative: 0 %
Eosinophils Absolute: 0 10*3/uL (ref 0.0–0.5)
Eosinophils Relative: 0 %
HCT: 36.1 % — ABNORMAL LOW (ref 39.0–52.0)
Hemoglobin: 11.7 g/dL — ABNORMAL LOW (ref 13.0–17.0)
Lymphocytes Relative: 91 %
Lymphs Abs: 35.3 10*3/uL — ABNORMAL HIGH (ref 0.7–4.0)
MCH: 32.1 pg (ref 26.0–34.0)
MCHC: 32.4 g/dL (ref 30.0–36.0)
MCV: 98.9 fL (ref 80.0–100.0)
Monocytes Absolute: 0.8 10*3/uL (ref 0.1–1.0)
Monocytes Relative: 2 %
Neutro Abs: 2.7 10*3/uL (ref 1.7–7.7)
Neutrophils Relative %: 7 %
Platelets: 110 10*3/uL — ABNORMAL LOW (ref 150–400)
RBC: 3.65 MIL/uL — ABNORMAL LOW (ref 4.22–5.81)
RDW: 16.7 % — ABNORMAL HIGH (ref 11.5–15.5)
Smear Review: NORMAL
WBC Morphology: ABNORMAL
WBC: 38.8 10*3/uL — ABNORMAL HIGH (ref 4.0–10.5)
nRBC: 0 % (ref 0.0–0.2)

## 2018-11-22 LAB — LIPID PANEL
Cholesterol: 104 mg/dL (ref 0–200)
HDL: 50 mg/dL (ref >40)
LDL Cholesterol: 43 mg/dL (ref 0–99)
Total CHOL/HDL Ratio: 2.1 RATIO
Triglycerides: 57 mg/dL (ref <150)
VLDL: 11 mg/dL (ref 0–40)

## 2018-11-22 NOTE — Progress Notes (Signed)
I connected with [name] on 11/22/2018 at  2:30 PM EDT by telephone visit and verified that I am speaking with the correct person using two identifiers.  I discussed the limitations, risks, security and privacy concerns of performing an evaluation and management service by telemedicine and the availability of in-person appointments. I also discussed with the patient that there may be a patient responsible charge related to this service. The patient expressed understanding and agreed to proceed.    Other persons participating in the visit and their role in the encounter: wife Patient's location: home  Provider's location: home   Oncology History   # AUG 2015- SLL/CLL [Right Ax Ln Bx] s/p [place]-Rituxan x6 [finished March 2016]; Maintenance Rituxan q 68m[started April 2016; [name]];Last Ritux Jan 2017.  MARCH 2017- CT N/C/A/P- NED. STOP Ritux; surveillance   # AUG 2019- CT/PET- progression/NO transformation;  # NOV 2019- Progression; started Ibrutinib 420 mg/d. 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  male with history of CLL currently on ibrutinib.  Patient admits to mild to moderate fatigue.  Otherwise denies any unusual joint pains or headaches or falls.  Admits to mild to moderate easy bruising.  Otherwise no obvious bleeding.  Patient's blood pressures have been elevated anywhere between 130s to 190s.   Observation/objective: White count  38 hemoglobin 11 platelets 110.  Assessment and plan: CLL (chronic lymphocytic leukemia) ([place] # [facility]March 2020 CT scan shows significant improvement of adenopathy chest axilla/abdomen.   #Currently on ibrutinib 20 mg-tolerating well except for ongoing fatigue/labile hypertension.  See discussion below.  #Elevated blood pressure-recommend checking metoprolol 50 mg at nighttime and 25 mg in the morning.  # A.fib-on amiodarone sinus rhythm-on Eliquis.   DISPOSITION: # follow up in 1st week of June-[facility]-clinic; labs-cbc/cmp-[facility]-[name]      Follow-up instructions:  I discussed the assessment and treatment plan with the patient.  The patient was provided an opportunity to ask questions and all were answered.  The patient agreed with the plan and demonstrated understanding of instructions.  The patient was advised to call back or seek an in person evaluation if the symptoms worsen or if the condition fails to improve as anticipated.  I provided 12 minutes of non face-to-face telephone visit time during this encounter, and > 50% was spent counseling as documented under my assessment & plan.   Dr. GCharlaine DaltonCNescopeckat ASh[facility]5/06/2019 10:06 AM

## 2018-11-22 NOTE — Assessment & Plan Note (Addendum)
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   #Currently on ibrutinib 20 mg-tolerating well except for ongoing fatigue/labile hypertension.  See discussion below.  #Elevated blood pressure-recommend checking metoprolol 50 mg at nighttime and 25 mg in the morning.  # A.fib-on amiodarone sinus rhythm-on Eliquis.   DISPOSITION: # follow up in 1st week of June-MD-clinic; labs-cbc/cmp-[facility]-Dr.B

## 2018-11-22 NOTE — Telephone Encounter (Signed)
Spoke w/ pt and he stated that he attempted to call tech support but was on hold and he tried to call for 2 days and he isn't going to try anymore. Informed pt that I would send an e-mail to [employer] and have them call him in 1-2 business days. Pt agreed to this plan. Request to call pt submitted in carelink.

## 2018-11-24 ENCOUNTER — Telehealth: Payer: Self-pay | Admitting: Internal Medicine

## 2018-11-24 NOTE — Telephone Encounter (Signed)
LMOVM for pt to return call 

## 2018-11-24 NOTE — Telephone Encounter (Signed)
I sent in error to monitor tech. I will route to device.

## 2018-11-24 NOTE — Telephone Encounter (Signed)
New Message:     Patient calling concering his monitor not working and he has calls [employer] support and still not working. Please call patient.

## 2018-11-25 NOTE — Telephone Encounter (Signed)
Patient stated that he finally talked to tech support and a new monitor was ordered. Staff message sent to myself to follow up in 2 weeks.

## 2018-11-30 ENCOUNTER — Other Ambulatory Visit: Payer: Self-pay | Admitting: Internal Medicine

## 2018-12-01 ENCOUNTER — Other Ambulatory Visit: Payer: Self-pay | Admitting: Internal Medicine

## 2018-12-01 NOTE — Telephone Encounter (Signed)
I don't think this patient is supposed to be taking Allopurinol at this time. Per Dr. Agnes Lawrence note, "Tumor lysis prophylaxis -STOP allopurinol; as patient is currently not going on venetoclax.".   Can anyone clarify if this medications is needed?  Faythe Casa, NP 12/01/2018 11:42 AM

## 2018-12-01 NOTE — Telephone Encounter (Signed)
Wife states he is taking it for gout

## 2018-12-02 MED ORDER — ALLOPURINOL 300 MG PO TABS: 300.0000 mg | ORAL_TABLET | Freq: Two times a day (BID) | ORAL | 0 refills | Status: DC

## 2018-12-02 NOTE — Telephone Encounter (Signed)
Ok. Maybe PCP? I don't mind refilling but he needs to switch to his PCP because we are not treating him for his gout.

## 2018-12-06 ENCOUNTER — Telehealth: Payer: Self-pay | Admitting: Internal Medicine

## 2018-12-06 NOTE — Telephone Encounter (Signed)
Spoke w/ pt and informed him that his home monitor was ordered 11/24/2018 and that if he doesn't have a monitor by Wednesday to call back and we will call [employer] for an update on tracking. Pt verbalized understanding.

## 2018-12-06 NOTE — Telephone Encounter (Signed)
New Message:    Patient calling concering his device and would like to know if some one can call him. Patient states he has not received his device.

## 2018-12-09 ENCOUNTER — Telehealth: Payer: Self-pay | Admitting: Internal Medicine

## 2018-12-09 NOTE — Telephone Encounter (Signed)
PLEASE CALL TO DISCUSS MONITOR , PT WOULD NOT GIVE ANY MORE INFORMATION

## 2018-12-10 ENCOUNTER — Ambulatory Visit (INDEPENDENT_AMBULATORY_CARE_PROVIDER_SITE_OTHER): Payer: Medicare HMO | Admitting: *Deleted

## 2018-12-10 DIAGNOSIS — I495 Sick sinus syndrome: Secondary | ICD-10-CM

## 2018-12-10 NOTE — Telephone Encounter (Signed)
I think he is referring to his [employer] monitor.   Will forward to the Device clinic pool.

## 2018-12-10 NOTE — Telephone Encounter (Signed)
LMOVM to call my direct office number to get help with his monitor.

## 2018-12-11 LAB — CUP PACEART REMOTE DEVICE CHECK
Battery Remaining Longevity: 79 mo
Battery Voltage: 3.01 V
Brady Statistic AP VP Percent: 0.42 %
Brady Statistic AP VS Percent: 99.34 %
Brady Statistic AS VP Percent: 0 %
Brady Statistic AS VS Percent: 0.24 %
Brady Statistic RA Percent Paced: 99.76 %
Brady Statistic RV Percent Paced: 0.44 %
Date Time Interrogation Session: 20200523115003
Implantable Lead Implant Date: 20170814
Implantable Lead Implant Date: 20170814
Implantable Lead Location: 753859
Implantable Lead Location: 753860
Implantable Lead Model: 5076
Implantable Lead Model: 5076
Implantable Pulse Generator Implant Date: 20170814
Lead Channel Impedance Value: 323 Ohm
Lead Channel Impedance Value: 399 Ohm
Lead Channel Impedance Value: 456 Ohm
Lead Channel Impedance Value: 551 Ohm
Lead Channel Pacing Threshold Amplitude: 0.75 V
Lead Channel Pacing Threshold Amplitude: 0.75 V
Lead Channel Pacing Threshold Pulse Width: 0.4 ms
Lead Channel Pacing Threshold Pulse Width: 0.4 ms
Lead Channel Sensing Intrinsic Amplitude: 22.25 mV
Lead Channel Sensing Intrinsic Amplitude: 22.25 mV
Lead Channel Sensing Intrinsic Amplitude: 4.25 mV
Lead Channel Sensing Intrinsic Amplitude: 4.25 mV
Lead Channel Setting Pacing Amplitude: 2 V
Lead Channel Setting Pacing Amplitude: 2.5 V
Lead Channel Setting Pacing Pulse Width: 0.4 ms
Lead Channel Setting Sensing Sensitivity: 0.9 mV

## 2018-12-14 NOTE — Telephone Encounter (Signed)
Spoke w/ pt and informed him that his remote transmission was received on 12/11/2018. Pt verbalized understanding.

## 2018-12-20 ENCOUNTER — Other Ambulatory Visit: Payer: Self-pay

## 2018-12-20 ENCOUNTER — Inpatient Hospital Stay (HOSPITAL_BASED_OUTPATIENT_CLINIC_OR_DEPARTMENT_OTHER): Payer: Medicare HMO | Admitting: Internal Medicine

## 2018-12-20 ENCOUNTER — Encounter: Payer: Self-pay | Admitting: Internal Medicine

## 2018-12-20 ENCOUNTER — Inpatient Hospital Stay: Payer: Medicare HMO | Attending: Internal Medicine

## 2018-12-20 DIAGNOSIS — Z87891 Personal history of nicotine dependence: Secondary | ICD-10-CM | POA: Insufficient documentation

## 2018-12-20 DIAGNOSIS — R233 Spontaneous ecchymoses: Secondary | ICD-10-CM | POA: Diagnosis not present

## 2018-12-20 DIAGNOSIS — I10 Essential (primary) hypertension: Secondary | ICD-10-CM

## 2018-12-20 DIAGNOSIS — M2550 Pain in unspecified joint: Secondary | ICD-10-CM

## 2018-12-20 DIAGNOSIS — Z7901 Long term (current) use of anticoagulants: Secondary | ICD-10-CM | POA: Diagnosis not present

## 2018-12-20 DIAGNOSIS — C9110 Chronic lymphocytic leukemia of B-cell type not having achieved remission: Secondary | ICD-10-CM

## 2018-12-20 DIAGNOSIS — Z5112 Encounter for antineoplastic immunotherapy: Secondary | ICD-10-CM | POA: Diagnosis not present

## 2018-12-20 DIAGNOSIS — M791 Myalgia, unspecified site: Secondary | ICD-10-CM | POA: Insufficient documentation

## 2018-12-20 DIAGNOSIS — I4891 Unspecified atrial fibrillation: Secondary | ICD-10-CM

## 2018-12-20 LAB — CBC WITH DIFFERENTIAL/PLATELET
Abs Immature Granulocytes: 0.05 10*3/uL (ref 0.00–0.07)
Basophils Absolute: 0.1 10*3/uL (ref 0.0–0.1)
Basophils Relative: 0 %
Eosinophils Absolute: 0.1 10*3/uL (ref 0.0–0.5)
Eosinophils Relative: 0 %
HCT: 35.6 % — ABNORMAL LOW (ref 39.0–52.0)
Hemoglobin: 11.3 g/dL — ABNORMAL LOW (ref 13.0–17.0)
Immature Granulocytes: 0 %
Lymphocytes Relative: 93 %
Lymphs Abs: 55.9 10*3/uL — ABNORMAL HIGH (ref 0.7–4.0)
MCH: 32.8 pg (ref 26.0–34.0)
MCHC: 31.7 g/dL (ref 30.0–36.0)
MCV: 103.2 fL — ABNORMAL HIGH (ref 80.0–100.0)
Monocytes Absolute: 0.4 10*3/uL (ref 0.1–1.0)
Monocytes Relative: 1 %
Neutro Abs: 3.5 10*3/uL (ref 1.7–7.7)
Neutrophils Relative %: 6 %
Platelets: 127 10*3/uL — ABNORMAL LOW (ref 150–400)
RBC: 3.45 MIL/uL — ABNORMAL LOW (ref 4.22–5.81)
RDW: 17.5 % — ABNORMAL HIGH (ref 11.5–15.5)
Smear Review: NORMAL
WBC: 59.9 10*3/uL (ref 4.0–10.5)
nRBC: 0 % (ref 0.0–0.2)

## 2018-12-20 MED ORDER — ACYCLOVIR 400 MG PO TABS: ORAL_TABLET | ORAL | 3 refills | Status: DC

## 2018-12-20 MED ORDER — MONTELUKAST SODIUM 10 MG PO TABS: 10.0000 mg | ORAL_TABLET | Freq: Every day | ORAL | 0 refills | Status: DC

## 2018-12-20 MED ORDER — DEXAMETHASONE 4 MG PO TABS: ORAL_TABLET | ORAL | 3 refills | Status: DC

## 2018-12-20 NOTE — Progress Notes (Signed)
[place] OFFICE PROGRESS NOTE  Patient Care Team: Leone Haven, MD as PCP - General (Family Medicine) [name], MD as Consulting Physician (Cardiology) Cammie Sickle, MD as Medical Oncologist (Hematology and Oncology)  Cancer Staging No matching staging information was found for the patient.   Oncology History   # AUG 2015- SLL/CLL [Right Ax Ln Bx] s/p Benda-Rituxan x6 [finished March 2016]; Maintenance Rituxan q 22m[started April 2016; Dr.Pandit];Last Ritux Jan 2017.  MARCH 2017- CT N/C/A/P- NED. STOP Ritux; surveillance   # AUG 2019- CT/PET- progression/NO transformation;  # NOV 2019- Progression; started Ibrutinib 420 mg/d. STOPPED in end of feb sec to extreme fatigue/joint pains/cramps  #November 01, 2018-start ibrutinib 280 mg a day; December 20, 2018-discontinue ibrutinib secondary multiple side effects.   #January 03, 2019 start Gazyva+ Ven [July]   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; IGVH- UN-MUTATED [poor prognosis]  DIAGNOSIS: CLL  STAGE: IV  ;GOALS: control  CURRENT/MOST RECENT THERAPY : Ibrutinib on HOLD      CLL (chronic lymphocytic leukemia) ([place]   01/03/2019 -  Chemotherapy    The patient had obinutuzumab (GAZYVA) 100 mg in sodium chloride 0.9 % 100 mL (0.9615 mg/mL) chemo infusion, 100 mg, Intravenous, Once, 0 of 6 cycles  for chemotherapy treatment.      INTERVAL HISTORY:  Michael EVERTON89y.o.  male CLL-high risk most recently on ibrutinib is here for follow-up.  Patient has multiple complaints today.  Complains of easy bruising/spontaneous ecchymosis.  Complains of extreme fatigue.  Complains of myalgias/joint pains.  Has been interrupting his lifestyle.  Also complains his blood pressures up-and-down 1 50-1 60s at home.  Review of Systems   Constitutional: Positive for malaise/fatigue. Negative for chills, diaphoresis and fever.  HENT: Negative for nosebleeds and sore throat.   Eyes: Negative for double vision.  Respiratory: Negative for cough, hemoptysis, sputum production, shortness of breath and wheezing.   Cardiovascular: Negative for chest pain, palpitations, orthopnea and leg swelling.  Gastrointestinal: Negative for abdominal pain, blood in stool, constipation, diarrhea, heartburn, melena, nausea and vomiting.  Genitourinary: Negative for dysuria, frequency and urgency.  Musculoskeletal: Positive for back pain and joint pain.  Skin: Negative.  Negative for itching and rash.  Neurological: Positive for weakness. Negative for dizziness, tingling, focal weakness and headaches.  Psychiatric/Behavioral: Negative for depression. The patient is not nervous/anxious and does not have insomnia.       PAST MEDICAL HISTORY :  Past Medical History:  Diagnosis Date  . Anxiety   . Arthritis   . Atrial fibrillation ([place]    a. Dx 2013, recurred 02/2014, CHA2DS2VASc = 3 -->placed on Eliquis;  b. 02/2014 Echo: EF 50-55%, mid and apical anterior septum and mid and apical inf septum are abnl, mild to mod Ao sclerosis w/o AS.  .Marland KitchenChicken pox   . Chronic lymphocytic leukemia ([place]    a. Dx 02/2014.  .Marland KitchenComplication of anesthesia    History of  PTSD--do not touch patient when waking up from surgery.  .Marland KitchenCOPD (chronic obstructive pulmonary disease) ([place]   . Coronary artery disease    a. 04/2009 CABG x 3 (LIMA->LAD, VG->OM1, VG->PDA);  b. 09/2009 Cath: occluded VG x 2 w/ patent LIMA and L->R collats. EF 55%, mild antlat HK;  c. 10/2011 MV: EF 53%, no  isch/infarct-->low risk.  Marland Kitchen Dysrhythmia    hx of a-fib  . GERD (gastroesophageal reflux disease)    occasional  . History of chemotherapy 2015-2016  . HOH (hard of hearing)    Bilateral Hearing Aids  . Hypertension   . Myocardial infarction ([place]) 2010  . OSA on CPAP    USE C-PAP  .  Presence of permanent cardiac pacemaker 2017  . PTSD (post-traumatic stress disorder)   . PTSD (post-traumatic stress disorder)   . Pure hypercholesterolemia   . Rheumatic fever 1959  . 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 ABDOMINAL HERNIA REPAIR    . APPENDECTOMY  06/21/1985  . CARDIAC CATHETERIZATION  2010; 2011   ; Dr Fletcher Anon  . CORONARY ARTERY BYPASS GRAFT  04/2009   "CABG X3"  . EP IMPLANTABLE DEVICE N/A 03/03/2016   Procedure: Pacemaker Implant;  Surgeon: Deboraha Sprang, MD;  Location: [facility];  Service: Cardiovascular;  Laterality: N/A;  . FOREIGN BODY REMOVAL  1968   "shrapnel in my tailbone"  . INGUINAL HERNIA REPAIR Right   . INSERT / REPLACE / REMOVE PACEMAKER    . JOINT REPLACEMENT Right 2018  . LAPAROSCOPIC CHOLECYSTECTOMY    . TONSILLECTOMY AND ADENOIDECTOMY  1956  . TOTAL HIP ARTHROPLASTY Right 10/22/2016   Procedure: TOTAL HIP ARTHROPLASTY;  Surgeon: Dereck Leep, MD;  Location: [facility];  Service: Orthopedics;  Laterality: Right;  . TOTAL HIP ARTHROPLASTY Left 11/04/2017   Procedure: TOTAL HIP ARTHROPLASTY;  Surgeon: Dereck Leep, MD;  Location: [facility];  Service: Orthopedics;  Laterality: Left;    FAMILY HISTORY :   Family History  Problem Relation Age of Onset  . Heart disease Mother   . Heart attack Mother   . Coronary artery disease Other        family history    SOCIAL HISTORY:   Social History   Tobacco Use  . Smoking status: Former Smoker    Packs/day: 1.00    Years: 40.00    Pack years: 40.00    Types: Cigarettes    Last attempt to quit: 07/21/2006    Years since quitting: 12.4  . Smokeless tobacco: Never Used  Substance Use Topics  . Alcohol use: Yes    Alcohol/week: 1.0 standard drinks    Types: 1 Standard drinks or equivalent per week    Comment: rarely  . Drug use: No    ALLERGIES:  has No Known Allergies.  MEDICATIONS:   Current Outpatient Medications  Medication Sig Dispense Refill  . acetaminophen (TYLENOL) 500 MG tablet Take 1,000 mg by mouth every 8 (eight) hours as needed for mild pain.    Marland Kitchen albuterol (PROVENTIL HFA;VENTOLIN HFA) 108 (90 Base) MCG/ACT inhaler Inhale 2 puffs into the lungs every 6 (six) hours as needed for wheezing or shortness of breath. 1 Inhaler 11  . allopurinol (ZYLOPRIM) 300 MG tablet Take 1 tablet (300 mg total) by mouth 2 (two) times daily. 120 tablet 0  . amiodarone (PACERONE) 200 MG tablet TAKE 1/2 TABLET (100 mg)  BY MOUTH TWICE DAILY 90 tablet 2  . apixaban (ELIQUIS) 5 MG TABS tablet Take 5 mg by mouth 2 (two) times daily.    Marland Kitchen atorvastatin (LIPITOR) 80 MG tablet Take 1 tablet (80 mg total) by mouth at bedtime. 90 tablet 3  . budesonide-formoterol (SYMBICORT) 160-4.5 MCG/ACT inhaler Inhale 2 puffs into  the lungs 2 (two) times daily. 1 Inhaler 11  . cetirizine (ZYRTEC) 10 MG tablet Take 10 mg by mouth daily as needed for allergies.     . Coenzyme Q10 (COQ10) 200 MG CAPS Take 200 mg by mouth daily.    Marland Kitchen ezetimibe (ZETIA) 10 MG tablet Take 1 tablet (10 mg total) by mouth daily. 90 tablet 3  . Ibrutinib (IMBRUVICA) 280 MG TABS Take 280 mg by mouth daily. 30 tablet 4  . Krill Oil 350 MG CAPS Take 350 mg by mouth every evening.    . metoprolol tartrate (LOPRESSOR) 25 MG tablet Take 25 mg by mouth 2 (two) times daily.    . mirtazapine (REMERON) 15 MG tablet Take 15 mg by mouth at bedtime as needed (for panic associated with PTSD).     . Multiple Vitamin (MULTIVITAMIN WITH MINERALS) TABS tablet Take 1 tablet by mouth daily.    . mupirocin ointment (BACTROBAN) 2 % Apply 1 application topically 3 (three) times daily. 30 g 1  . nitroGLYCERIN (NITROSTAT) 0.4 MG SL tablet Place 1 tablet (0.4 mg total) under the tongue every 5 (five) minutes as needed for chest pain. 25 tablet 6  . tiotropium (SPIRIVA) 18 MCG inhalation capsule Place 1 capsule (18 mcg total) into inhaler and inhale at bedtime.  30 capsule 11  . traMADol (ULTRAM) 50 MG tablet TAKE 1 TABLET BY MOUTH EVERY 12 HOURS AS NEEDED FOR MODERATE PAIN 90 tablet 0  . acyclovir (ZOVIRAX) 400 MG tablet One pill a day [to prevent shingles] 30 tablet 3  . dexamethasone (DECADRON) 4 MG tablet Start 2 days prior to infusion; Take for 2 days. Do not take on the day of infusion. 60 tablet 3  . montelukast (SINGULAIR) 10 MG tablet Take 1 tablet (10 mg total) by mouth at bedtime. Start 2 days prior to infusion. Take it for 4 days. 60 tablet 0   No current facility-administered medications for this visit.     PHYSICAL EXAMINATION: ECOG PERFORMANCE STATUS: 1 - Symptomatic but completely ambulatory  BP (!) 152/83 (BP Location: Right Arm, Patient Position: Sitting)   Pulse 67   Temp 98 F (36.7 C) (Tympanic)   Wt 215 lb 2 oz (97.6 kg)   BMI 31.77 kg/m   Filed Weights   12/20/18 1452  Weight: 215 lb 2 oz (97.6 kg)    Physical Exam  Constitutional: He is oriented to person, place, and time and well-developed, well-nourished, and in no distress.    Accompanied by his daughter.  HENT:  Head: Normocephalic and atraumatic.  Mouth/Throat: Oropharynx is clear and moist. No oropharyngeal exudate.  Eyes: Pupils are equal, round, and reactive to light.  Neck: Normal range of motion. Neck supple.  Cardiovascular: Normal rate and regular rhythm.  Pulmonary/Chest: No respiratory distress. He has no wheezes.  Abdominal: Soft. Bowel sounds are normal. He exhibits no distension and no mass. There is no abdominal tenderness. There is no rebound and no guarding.  Large bruises noted in the left upper extremity and right flank.  Musculoskeletal: Normal range of motion.        General: No tenderness or edema.  Lymphadenopathy:  Resolved lymph nodes in the neck underarms.  Neurological: He is alert and oriented to person, place, and time.  Skin: Skin is warm.  Psychiatric: Affect normal.       LABORATORY DATA:  I have reviewed the data as  listed    Component Value Date/Time   NA 139 11/01/2018 0903  NA 139 10/11/2014 1800   K 3.8 11/01/2018 0903   K 3.3 (L) 10/11/2014 1800   CL 109 11/01/2018 0903   CL 106 10/11/2014 1800   CO2 25 11/01/2018 0903   CO2 27 10/11/2014 1800   GLUCOSE 124 (H) 11/01/2018 0903   GLUCOSE 107 (H) 10/11/2014 1800   BUN 19 11/01/2018 0903   BUN 15 10/11/2014 1800   CREATININE 1.15 11/01/2018 0903   CREATININE 0.89 10/11/2014 1800   CALCIUM 8.2 (L) 11/01/2018 0903   CALCIUM 8.8 (L) 10/11/2014 1800   PROT 6.5 11/01/2018 0903   PROT 6.7 05/18/2017 1048   PROT 6.4 (L) 10/11/2014 1800   ALBUMIN 3.8 11/01/2018 0903   ALBUMIN 4.3 05/18/2017 1048   ALBUMIN 4.1 10/11/2014 1800   AST 25 11/01/2018 0903   AST 23 10/11/2014 1800   ALT 28 11/01/2018 0903   ALT 22 10/11/2014 1800   ALKPHOS 91 11/01/2018 0903   ALKPHOS 61 10/11/2014 1800   BILITOT 0.7 11/01/2018 0903   BILITOT 0.7 05/18/2017 1048   BILITOT 0.9 10/11/2014 1800   GFRNONAA >60 11/01/2018 0903   GFRNONAA >60 10/11/2014 1800   GFRAA >60 11/01/2018 0903   GFRAA >60 10/11/2014 1800    No results found for: SPEP, UPEP  Lab Results  Component Value Date   WBC 59.9 (HH) 12/20/2018   NEUTROABS 3.5 12/20/2018   HGB 11.3 (L) 12/20/2018   HCT 35.6 (L) 12/20/2018   MCV 103.2 (H) 12/20/2018   PLT 127 (L) 12/20/2018      Chemistry      Component Value Date/Time   NA 139 11/01/2018 0903   NA 139 10/11/2014 1800   K 3.8 11/01/2018 0903   K 3.3 (L) 10/11/2014 1800   CL 109 11/01/2018 0903   CL 106 10/11/2014 1800   CO2 25 11/01/2018 0903   CO2 27 10/11/2014 1800   BUN 19 11/01/2018 0903   BUN 15 10/11/2014 1800   CREATININE 1.15 11/01/2018 0903   CREATININE 0.89 10/11/2014 1800      Component Value Date/Time   CALCIUM 8.2 (L) 11/01/2018 0903   CALCIUM 8.8 (L) 10/11/2014 1800   ALKPHOS 91 11/01/2018 0903   ALKPHOS 61 10/11/2014 1800   AST 25 11/01/2018 0903   AST 23 10/11/2014 1800   ALT 28 11/01/2018 0903   ALT 22  10/11/2014 1800   BILITOT 0.7 11/01/2018 0903   BILITOT 0.7 05/18/2017 1048   BILITOT 0.9 10/11/2014 1800       RADIOGRAPHIC STUDIES: I have personally reviewed the radiological images as listed and agreed with the findings in the report. No results found.   ASSESSMENT & PLAN:  CLL (chronic lymphocytic leukemia) ([place]) # Recurrent CLL [facility] unmutated/p53/deletion-11]-March 2020 CT scan shows significant improvement of adenopathy chest axilla/abdomen.  Currently on ibrutinib 280 milligrams a day-however see discussion below.  White count today is 59,000; hemoglobin 11.7 platelets 127.  #Patient tolerating ibrutinib extremely poorly-joint pains myalgias/fatigue/labile hypertension/spontaneous bruising.  Will discontinue ibrutinib given poor tolerance.  #Recommend starting the patient on the Gazyva-venetoclax.  Long discussion the patient regarding potential infusion reactions with Gazyva.  Will start patient on premedication with Singulair/Benadryl/Tylenol/dexamethasone 2 days prior.  Will start patient on venetoclax month #2; once white count is improved.  # Elevated blood pressure-150- 180s.  Continue current medication.  Hopefully coming off ibrutinib would help.  # Spontaneous bruising-secondary ibrutinib.  Stop ibrutinib as above  # A.fib-on amiodarone sinus rhythm-on Eliquis; stable.    DISPOSITION: # chemo ed  in 1 week.  # in 2 weeks- MD- Dyann Kief in 2 weeks [new]; labs- cbc/cmp/ldh-Dr.B  # 40 minutes face-to-face with the patient discussing the above plan of care; more than 50% of time spent on prognosis/ natural history; counseling and coordination.   Orders Placed This Encounter  Procedures  . CBC with Differential    Standing Status:   Future    Standing Expiration Date:   12/20/2019  . Comprehensive metabolic panel    Standing Status:   Future    Standing Expiration Date:   12/20/2019   All questions were answered. The patient knows to call the clinic with any problems,  questions or concerns.      Cammie Sickle, MD 12/20/2018 5:15 PM

## 2018-12-20 NOTE — Assessment & Plan Note (Addendum)
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  Currently on ibrutinib 280 milligrams a day-however see discussion below.  White count today is 59,000; hemoglobin 11.7 platelets 127.  #Patient tolerating ibrutinib extremely poorly-joint pains myalgias/fatigue/labile hypertension/spontaneous bruising.  Will discontinue ibrutinib given poor tolerance.  #Recommend starting the patient on the Gazyva-venetoclax.  Long discussion the patient regarding potential infusion reactions with Gazyva.  Will start patient on premedication with Singulair/Benadryl/Tylenol/dexamethasone 2 days prior.  Will start patient on venetoclax month #2; once white count is improved.  # Elevated blood pressure-150- 180s.  Continue current medication.  Hopefully coming off ibrutinib would help.  # Spontaneous bruising-secondary ibrutinib.  Stop ibrutinib as above  # A.fib-on amiodarone sinus rhythm-on Eliquis; stable.    DISPOSITION: # chemo ed in 1 week.  # in 2 weeks- MD- Dyann Kief in 2 weeks [new]; labs- cbc/cmp/ldh-Dr.B  # 40 minutes face-to-face with the patient discussing the above plan of care; more than 50% of time spent on prognosis/ natural history; counseling and coordination.

## 2018-12-20 NOTE — Patient Instructions (Addendum)
Singular 10 mg Take 1 tablet (10 mg total) by mouth at bedtime. Start 2 days prior to infusion. Take it for 4 days  acyclovir (ZOVIRAX) 400 MG - 1 tablet daily  dexamethasone (DECADRON) 4 MG tablet - Start 2 days prior to infusion; Take for 2 days. Do not take on the day of infusion.  Tylenol 650 mg 1 tablet by mouth at bedtime the evening prior to the infusion.  Benadryl 25 mg 1 tablet by mouth at bedtime the evening prior to the infusion.

## 2018-12-20 NOTE — Progress Notes (Signed)
START ON PATHWAY REGIMEN - Lymphoma and CLL     Cycle 1: A cycle is 28 days:     Venetoclax      Obinutuzumab      Obinutuzumab      Obinutuzumab    Cycle 2: A cycle is 28 days:     Venetoclax      Venetoclax      Venetoclax      Venetoclax      Obinutuzumab    Cycles 3 through 6: A cycle is 28 days:     Venetoclax      Obinutuzumab    Cycles 7 through 12: A cycle is 28 days:     Venetoclax   **Always confirm dose/schedule in your pharmacy ordering system**  Patient Characteristics: Chronic Lymphocytic Leukemia (CLL), First Line, Treatment Indicated, 17p del (+) or ATM Mutation Positive Disease Type: Chronic Lymphocytic Leukemia (CLL) Disease Type: Not Applicable Disease Type: Not Applicable Line of Therapy: First Line RAI Stage: IV Treatment Indicated<= Treatment Indicated ATM Mutation Status: Unknown 17p Deletion Status: Positive Intent of Therapy: Non-Curative / Palliative Intent, Discussed with Patient

## 2018-12-20 NOTE — Progress Notes (Signed)
Patient here today for follow up.   

## 2018-12-21 ENCOUNTER — Encounter: Payer: Self-pay | Admitting: Cardiology

## 2018-12-21 NOTE — Progress Notes (Signed)
Remote pacemaker transmission.   

## 2018-12-27 ENCOUNTER — Other Ambulatory Visit: Payer: Self-pay | Admitting: *Deleted

## 2018-12-27 DIAGNOSIS — C9110 Chronic lymphocytic leukemia of B-cell type not having achieved remission: Secondary | ICD-10-CM

## 2018-12-27 DIAGNOSIS — Z01812 Encounter for preprocedural laboratory examination: Secondary | ICD-10-CM

## 2018-12-27 NOTE — Progress Notes (Signed)
Letter  

## 2018-12-28 ENCOUNTER — Other Ambulatory Visit: Payer: Medicare HMO

## 2018-12-28 DIAGNOSIS — Z01812 Encounter for preprocedural laboratory examination: Secondary | ICD-10-CM

## 2018-12-28 DIAGNOSIS — C9110 Chronic lymphocytic leukemia of B-cell type not having achieved remission: Secondary | ICD-10-CM | POA: Diagnosis not present

## 2018-12-29 ENCOUNTER — Other Ambulatory Visit: Payer: Self-pay | Admitting: Internal Medicine

## 2018-12-29 DIAGNOSIS — C9110 Chronic lymphocytic leukemia of B-cell type not having achieved remission: Secondary | ICD-10-CM

## 2018-12-31 ENCOUNTER — Ambulatory Visit (INDEPENDENT_AMBULATORY_CARE_PROVIDER_SITE_OTHER): Payer: Medicare HMO | Admitting: Family Medicine

## 2018-12-31 ENCOUNTER — Telehealth: Payer: Self-pay | Admitting: Internal Medicine

## 2018-12-31 ENCOUNTER — Other Ambulatory Visit: Payer: Self-pay

## 2018-12-31 VITALS — BP 128/62 | HR 73 | Temp 98.1°F | Resp 18 | Ht 69.0 in | Wt 219.0 lb

## 2018-12-31 DIAGNOSIS — W57XXXA Bitten or stung by nonvenomous insect and other nonvenomous arthropods, initial encounter: Secondary | ICD-10-CM

## 2018-12-31 DIAGNOSIS — L03113 Cellulitis of right upper limb: Secondary | ICD-10-CM

## 2018-12-31 DIAGNOSIS — S40861A Insect bite (nonvenomous) of right upper arm, initial encounter: Secondary | ICD-10-CM | POA: Diagnosis not present

## 2018-12-31 LAB — NOVEL CORONAVIRUS, NAA: SARS-CoV-2, NAA: NOT DETECTED

## 2018-12-31 MED ORDER — CEFTRIAXONE SODIUM 1 G IJ SOLR
1.0000 g | Freq: Once | INTRAMUSCULAR | Status: AC
  Administered 2018-12-31: 1 g via INTRAMUSCULAR

## 2018-12-31 MED ORDER — METHYLPREDNISOLONE ACETATE 40 MG/ML IJ SUSP
40.0000 mg | Freq: Once | INTRAMUSCULAR | Status: AC
  Administered 2018-12-31: 40 mg via INTRAMUSCULAR

## 2018-12-31 MED ORDER — DOXYCYCLINE HYCLATE 100 MG PO TABS: 100.0000 mg | ORAL_TABLET | Freq: Two times a day (BID) | ORAL | 0 refills | Status: DC

## 2018-12-31 NOTE — Patient Instructions (Signed)

## 2018-12-31 NOTE — Telephone Encounter (Signed)
Spoke to pt and completed travel screen. Also explained about addl screening questions they will be asked, new guidelines about mask req, no visitors, and fever checks °

## 2018-12-31 NOTE — Progress Notes (Signed)
Subjective:    Patient ID: [name], male    DOB: [birth year], 73 y.o.   MRN: [id number]  HPI  Patient presents to clinic due to insect bite, suspects it is from a horsefly.   States after the horse fly bit him he noticed his right hand began to swell up and become red.  Patient states he has had cellulitis before and wanted to be sure this did not get out of hand before the weekend.  Patient Active Problem List   Diagnosis Date Noted  . Gross hematuria 05/12/2018  . Sick sinus syndrome ([place]) 07/08/2016  . Goals of care, counseling/discussion 07/08/2016  . 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 Chronic fatigue   . 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 PAD (peripheral artery disease) ([place])   . OSA on CPAP   . TIA (transient ischemic attack) 11/02/2015  . Chronic diastolic CHF (congestive heart failure) ([place]) 09/20/2015  . CLL (chronic lymphocytic leukemia) ([place])   . PTSD (post-traumatic stress disorder)   . Osteoarthritis of both knees 07/05/2015  . COPD (chronic obstructive pulmonary disease) ([place]) 01/01/2012  . Shortness of breath 10/09/2011  . Paroxysmal atrial fibrillation ([place]) 10/09/2011  . Hyperlipidemia 11/28/2009  . Essential hypertension 11/28/2009   Social History   Tobacco Use  . Smoking status: Former Smoker    Packs/day: 1.00    Years: 40.00    Pack years: 40.00    Types: Cigarettes    Quit date: 07/21/2006    Years since quitting: 12.4  . Smokeless tobacco: Never Used  Substance Use Topics  . Alcohol use: Yes    Alcohol/week: 1.0 standard drinks    Types: 1 Standard drinks or equivalent per week    Comment: rarely   Review of Systems   Constitutional: Negative for chills, fatigue and fever.  HENT: Negative for congestion, ear pain, sinus pain and sore throat.   Eyes: Negative.   Respiratory: Negative for cough, shortness of breath and wheezing.   Cardiovascular: Negative for chest pain, palpitations and  leg swelling.  Gastrointestinal: Negative for abdominal pain, diarrhea, nausea and vomiting.  Genitourinary: Negative for dysuria, frequency and urgency.  Musculoskeletal: Negative for arthralgias and myalgias.  Skin: +insect bite right hand, swelling and red skin Neurological: Negative for syncope, light-headedness and headaches.  Psychiatric/Behavioral: The patient is not nervous/anxious.       Objective:   Physical Exam Vitals signs and nursing note reviewed.  Constitutional:      General: He is not in acute distress.    Appearance: He is not ill-appearing, toxic-appearing or diaphoretic.  HENT:     Head: Normocephalic and atraumatic.  Eyes:     General: No scleral icterus.    Extraocular Movements: Extraocular movements intact.     Conjunctiva/sclera: Conjunctivae normal.  Cardiovascular:     Rate and Rhythm: Normal rate and regular rhythm.     Heart sounds: Normal heart sounds.  Pulmonary:     Effort: Pulmonary effort is normal. No respiratory distress.     Breath sounds: Normal breath sounds.  Skin:    General: Skin is warm and dry.     Findings: Erythema present.       Neurological:     Mental Status: He is alert and oriented to person, place, and time.     Gait: Gait abnormal (uses walking stick).  Psychiatric:        Mood and Affect: Mood normal.  Behavior: Behavior normal.        Thought Content: Thought content normal.        Judgment: Judgment normal.     Vitals:   12/31/18 1115  BP: 128/62  Pulse: 73  Resp: 18  Temp: 98.1 F (36.7 C)  SpO2: 97%      Assessment & Plan:    Insect bite back of right hand, cellulitis - we will treat patient with IM Rocephin and IM methylprednisolone x1 in clinic to help jump start cellulitis treatment.  He will also take oral doxycycline twice daily for 10 days.  He will monitor himself for any worsening redness, swelling, development of fever or any streaking of skin.  Advised that if his swelling redness  becomes worse to call office right away and or go to urgent care over the weekend.  Advised he can use Tylenol as needed for any pain.  Advised to keep skin clean and dry.  Administrations This Visit    cefTRIAXone (ROCEPHIN) injection 1 g    Admin Date 12/31/2018 Action Given Dose 1 g Route Intramuscular Administered By Neta Ehlers, RMA       methylPREDNISolone acetate (DEPO-MEDROL) injection 40 mg    Admin Date 12/31/2018 Action Given Dose 40 mg Route Intramuscular Administered By Neta Ehlers, RMA         Patient will follow-up in 4 to 5 days for recheck on cellulitis to be sure it is improving.

## 2019-01-03 ENCOUNTER — Inpatient Hospital Stay: Payer: Medicare HMO

## 2019-01-03 ENCOUNTER — Other Ambulatory Visit: Payer: Self-pay

## 2019-01-03 ENCOUNTER — Inpatient Hospital Stay (HOSPITAL_BASED_OUTPATIENT_CLINIC_OR_DEPARTMENT_OTHER): Payer: Medicare HMO | Admitting: Internal Medicine

## 2019-01-03 DIAGNOSIS — C9110 Chronic lymphocytic leukemia of B-cell type not having achieved remission: Secondary | ICD-10-CM

## 2019-01-03 DIAGNOSIS — R233 Spontaneous ecchymoses: Secondary | ICD-10-CM

## 2019-01-03 DIAGNOSIS — Z7901 Long term (current) use of anticoagulants: Secondary | ICD-10-CM | POA: Diagnosis not present

## 2019-01-03 DIAGNOSIS — M791 Myalgia, unspecified site: Secondary | ICD-10-CM | POA: Diagnosis not present

## 2019-01-03 DIAGNOSIS — I4891 Unspecified atrial fibrillation: Secondary | ICD-10-CM | POA: Diagnosis not present

## 2019-01-03 DIAGNOSIS — I10 Essential (primary) hypertension: Secondary | ICD-10-CM | POA: Diagnosis not present

## 2019-01-03 DIAGNOSIS — Z87891 Personal history of nicotine dependence: Secondary | ICD-10-CM | POA: Diagnosis not present

## 2019-01-03 DIAGNOSIS — Z5112 Encounter for antineoplastic immunotherapy: Secondary | ICD-10-CM | POA: Diagnosis not present

## 2019-01-03 DIAGNOSIS — Z7189 Other specified counseling: Secondary | ICD-10-CM

## 2019-01-03 LAB — CBC WITH DIFFERENTIAL/PLATELET
Abs Immature Granulocytes: 0.21 10*3/uL — ABNORMAL HIGH (ref 0.00–0.07)
Basophils Absolute: 0.1 10*3/uL (ref 0.0–0.1)
Basophils Relative: 0 %
Eosinophils Absolute: 0 10*3/uL (ref 0.0–0.5)
Eosinophils Relative: 0 %
HCT: 36.1 % — ABNORMAL LOW (ref 39.0–52.0)
Hemoglobin: 11.3 g/dL — ABNORMAL LOW (ref 13.0–17.0)
Immature Granulocytes: 0 %
Lymphocytes Relative: 81 %
Lymphs Abs: 51.8 10*3/uL — ABNORMAL HIGH (ref 0.7–4.0)
MCH: 32.4 pg (ref 26.0–34.0)
MCHC: 31.3 g/dL (ref 30.0–36.0)
MCV: 103.4 fL — ABNORMAL HIGH (ref 80.0–100.0)
Monocytes Absolute: 2.3 10*3/uL — ABNORMAL HIGH (ref 0.1–1.0)
Monocytes Relative: 4 %
Neutro Abs: 9.4 10*3/uL — ABNORMAL HIGH (ref 1.7–7.7)
Neutrophils Relative %: 15 %
Platelets: 138 10*3/uL — ABNORMAL LOW (ref 150–400)
RBC: 3.49 MIL/uL — ABNORMAL LOW (ref 4.22–5.81)
RDW: 16.9 % — ABNORMAL HIGH (ref 11.5–15.5)
Smear Review: NORMAL
WBC: 63.7 10*3/uL (ref 4.0–10.5)
nRBC: 0 % (ref 0.0–0.2)

## 2019-01-03 LAB — MAGNESIUM: Magnesium: 2.3 mg/dL (ref 1.7–2.4)

## 2019-01-03 LAB — LACTATE DEHYDROGENASE: LDH: 192 U/L (ref 98–192)

## 2019-01-03 LAB — COMPREHENSIVE METABOLIC PANEL
ALT: 23 U/L (ref 0–44)
AST: 19 U/L (ref 15–41)
Albumin: 4 g/dL (ref 3.5–5.0)
Alkaline Phosphatase: 91 U/L (ref 38–126)
Anion gap: 7 (ref 5–15)
BUN: 30 mg/dL — ABNORMAL HIGH (ref 8–23)
CO2: 22 mmol/L (ref 22–32)
Calcium: 9 mg/dL (ref 8.9–10.3)
Chloride: 115 mmol/L — ABNORMAL HIGH (ref 98–111)
Creatinine, Ser: 0.96 mg/dL (ref 0.61–1.24)
GFR calc Af Amer: >60 mL/min (ref >60)
GFR calc non Af Amer: >60 mL/min (ref >60)
Glucose, Bld: 170 mg/dL — ABNORMAL HIGH (ref 70–99)
Potassium: 4 mmol/L (ref 3.5–5.1)
Sodium: 144 mmol/L (ref 135–145)
Total Bilirubin: 0.6 mg/dL (ref 0.3–1.2)
Total Protein: 7 g/dL (ref 6.5–8.1)

## 2019-01-03 LAB — PATHOLOGIST SMEAR REVIEW

## 2019-01-03 LAB — PHOSPHORUS: Phosphorus: 3.3 mg/dL (ref 2.5–4.6)

## 2019-01-03 MED ORDER — DIPHENHYDRAMINE HCL 50 MG/ML IJ SOLN
50.0000 mg | Freq: Once | INTRAMUSCULAR | Status: AC
  Administered 2019-01-03: 50 mg via INTRAVENOUS
  Filled 2019-01-03: qty 1

## 2019-01-03 MED ORDER — ACETAMINOPHEN 325 MG PO TABS
650.0000 mg | ORAL_TABLET | Freq: Once | ORAL | Status: AC
  Administered 2019-01-03: 650 mg via ORAL
  Filled 2019-01-03: qty 2

## 2019-01-03 MED ORDER — SODIUM CHLORIDE 0.9 % IV SOLN
100.0000 mg | Freq: Once | INTRAVENOUS | Status: AC
  Administered 2019-01-03: 100 mg via INTRAVENOUS
  Filled 2019-01-03: qty 4

## 2019-01-03 MED ORDER — SODIUM CHLORIDE 0.9 % IV SOLN
Freq: Once | INTRAVENOUS | Status: AC
  Administered 2019-01-03: 10:00:00 via INTRAVENOUS
  Filled 2019-01-03: qty 250

## 2019-01-03 MED ORDER — SODIUM CHLORIDE 0.9 % IV SOLN
20.0000 mg | Freq: Once | INTRAVENOUS | Status: AC
  Administered 2019-01-03: 20 mg via INTRAVENOUS
  Filled 2019-01-03: qty 2

## 2019-01-03 NOTE — Assessment & Plan Note (Addendum)
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  March 2020 CT scan shows significant improvement of adenopathy chest axilla/abdomen.  Stop in end of May 2020 because of poor tolerance.  #Proceed with Gazyva-cycle #1 today.)  Today 63,000; hemoglobin 11 lakelets 130s.  Status post premedication with Singulair/Benadryl/Tylenol/dexamethasone 2 days prior.  Will start patient on venetoclax month #2; once white count is improved.  # Elevated blood pressure-150- 180s.  Continue current medication.   # Spontaneous bruising-secondary ibrutinib.  Improved.   # A.fib-on amiodarone sinus rhythm-on Eliquis; stable.   # TLS/infectious Prophylaxis-allopurinol; Bactrim/acyclovir.  DISPOSITION: # Gazyva today # in 1 weeks- MD- Dyann Kief;  labs- cbc/cmp/ldh-Dr.B

## 2019-01-03 NOTE — Progress Notes (Signed)
Patient states that he has been having intermittent muscular pain in his legs for the past couple of months. He only takes Tylenol or Ultram once in a while.

## 2019-01-03 NOTE — Progress Notes (Signed)
[place] OFFICE PROGRESS NOTE  [name] Care Team: Leone Haven, MD as PCP - General (Family Medicine) [name], MD as Consulting Physician (Cardiology) Cammie Sickle, MD as Medical Oncologist (Hematology and Oncology)  Cancer Staging No matching staging information was found for the [name].   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  MARCH 2017- CT N/C/A/P- NED. STOP Ritux; surveillance   # AUG 2019- CT/PET- progression/NO transformation;  # NOV 2019- Progression; started Ibrutinib 420 mg/d. STOPPED in end of feb sec to extreme fatigue/joint pains/cramps  #November 01, 2018-start ibrutinib 280 mg a day; December 20, 2018-discontinue ibrutinib secondary multiple side effects.   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    CLL (chronic lymphocytic leukemia) ([place]  01/03/2019 -  Chemotherapy   The [name] had obinutuzumab (GAZYVA) 100 mg in sodium chloride 0.9 % 100 mL (0.9615 mg/mL) chemo infusion, 100 mg, Intravenous, Once, 1 of 6 cycles Administration: 100 mg (01/03/2019)  for chemotherapy treatment.      INTERVAL HISTORY:  Michael [id number].  male CLL-high risk most recently on ibrutinib is here for follow-up.  Ibrutinib was discontinued approximately 2 weeks ago because of poor tolerance.  [name] is here to start Gazyva cycle number 1 day 1.  [name] admits to be compliant with his premedications.   His energy levels are good.  Denies any spontaneous  bruising or ecchymosis.   Review of Systems  Constitutional: Positive for malaise/fatigue. Negative for chills, diaphoresis and fever.  HENT: Negative for nosebleeds and sore throat.   Eyes: Negative for double vision.  Respiratory: Negative for cough, hemoptysis, sputum production, shortness of breath and wheezing.   Cardiovascular: Negative for chest pain, palpitations, orthopnea and leg swelling.  Gastrointestinal: Negative for abdominal pain, blood in stool, constipation, diarrhea, heartburn, melena, nausea and vomiting.  Genitourinary: Negative for dysuria, frequency and urgency.  Musculoskeletal: Positive for back pain and joint pain.  Skin: Negative.  Negative for itching and rash.  Neurological: Positive for weakness. Negative for dizziness, tingling, focal weakness and headaches.  Psychiatric/Behavioral: Negative for depression. The [name] is not nervous/anxious and does not have insomnia.       PAST MEDICAL HISTORY :  Past Medical History:  Diagnosis Date  . Anxiety   . Arthritis   . Atrial fibrillation ([place]    a. Dx 2013, recurred 02/2014, CHA2DS2VASc = 3 -->placed on Eliquis;  b. 02/2014 Echo: EF 50-55%, mid and apical anterior septum and mid and apical inf septum are abnl, mild to mod Ao sclerosis w/o AS.  .Marland KitchenChicken pox   . Chronic lymphocytic leukemia ([place]    a. Dx 02/2014.  .Marland KitchenComplication of anesthesia    History of  PTSD--do not touch [name] when waking up from surgery.  .Marland KitchenCOPD (chronic obstructive pulmonary disease) ([place]   . Coronary artery disease    a. 04/2009 CABG x 3 (LIMA->LAD, VG->OM1, VG->PDA);  b. 09/2009 Cath: occluded VG x 2 w/ patent LIMA and L->R collats. EF 55%,  mild antlat HK;  c. 10/2011 MV: EF 53%, no isch/infarct-->low risk.  Marland Kitchen Dysrhythmia    hx of a-fib  . GERD (gastroesophageal reflux disease)    occasional  . History of chemotherapy 2015-2016  . HOH (hard of hearing)    Bilateral Hearing Aids  . Hypertension   . Myocardial infarction ([place])  2010  . OSA on CPAP    USE C-PAP  . Presence of permanent cardiac pacemaker 2017  . PTSD (post-traumatic stress disorder)   . PTSD (post-traumatic stress disorder)   . Pure hypercholesterolemia   . Rheumatic fever 1959  . 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 ABDOMINAL HERNIA REPAIR    . APPENDECTOMY  06/21/1985  . CARDIAC CATHETERIZATION  2010; 2011   ; Dr Fletcher Anon  . CORONARY ARTERY BYPASS GRAFT  04/2009   "CABG X3"  . EP IMPLANTABLE DEVICE N/A 03/03/2016   Procedure: Pacemaker Implant;  Surgeon: Deboraha Sprang, MD;  Location: [place] [place] [facility];  Service: Cardiovascular;  Laterality: N/A;  . FOREIGN BODY REMOVAL  1968   "shrapnel in my tailbone"  . INGUINAL HERNIA REPAIR Right   . INSERT / REPLACE / REMOVE PACEMAKER    . JOINT REPLACEMENT Right 2018  . LAPAROSCOPIC CHOLECYSTECTOMY    . TONSILLECTOMY AND ADENOIDECTOMY  1956  . TOTAL HIP ARTHROPLASTY Right 10/22/2016   Procedure: TOTAL HIP ARTHROPLASTY;  Surgeon: Dereck Leep, MD;  Location: [facility];  Service: Orthopedics;  Laterality: Right;  . TOTAL HIP ARTHROPLASTY Left 11/04/2017   Procedure: TOTAL HIP ARTHROPLASTY;  Surgeon: Dereck Leep, MD;  Location: [facility];  Service: Orthopedics;  Laterality: Left;    FAMILY HISTORY :   Family History  Problem Relation Age of Onset  . Heart disease Mother   . Heart attack Mother   . Coronary artery disease Other        family history    SOCIAL HISTORY:   Social History   Tobacco Use  . Smoking status: Former Smoker    Packs/day: 1.00    Years: 40.00    Pack years: 40.00    Types: Cigarettes    Quit date: 07/21/2006    Years since quitting: 12.4  . Smokeless tobacco: Never Used  Substance Use Topics  . Alcohol use: Yes    Alcohol/week: 1.0 standard drinks    Types: 1 Standard drinks or equivalent per week    Comment: rarely  . Drug use: No    ALLERGIES:  has No Known  Allergies.  MEDICATIONS:  Current Outpatient Medications  Medication Sig Dispense Refill  . acetaminophen (TYLENOL) 500 MG tablet Take 1,000 mg by mouth every 8 (eight) hours as needed for mild pain.    Marland Kitchen acyclovir (ZOVIRAX) 400 MG tablet One pill a day [to prevent shingles] 30 tablet 3  . albuterol (PROVENTIL HFA;VENTOLIN HFA) 108 (90 Base) MCG/ACT inhaler Inhale 2 puffs into the lungs every 6 (six) hours as needed for wheezing or shortness of breath. 1 Inhaler 11  . allopurinol (ZYLOPRIM) 300 MG tablet Take 1 tablet (300 mg total) by mouth 2 (two) times daily. 120 tablet 0  . amiodarone (PACERONE) 200 MG tablet TAKE 1/2 TABLET (100 mg)  BY MOUTH TWICE DAILY 90 tablet 2  . apixaban (ELIQUIS) 5 MG TABS tablet Take 5 mg by mouth 2 (two) times daily.    Marland Kitchen atorvastatin (LIPITOR) 80 MG  tablet Take 1 tablet (80 mg total) by mouth at bedtime. 90 tablet 3  . budesonide-formoterol (SYMBICORT) 160-4.5 MCG/ACT inhaler Inhale 2 puffs into the lungs 2 (two) times daily. 1 Inhaler 11  . cetirizine (ZYRTEC) 10 MG tablet Take 10 mg by mouth daily as needed for allergies.     . Coenzyme Q10 (COQ10) 200 MG CAPS Take 200 mg by mouth daily.    Marland Kitchen dexamethasone (DECADRON) 4 MG tablet Start 2 days prior to infusion; Take for 2 days. Do not take on the day of infusion. 60 tablet 3  . doxycycline (VIBRA-TABS) 100 MG tablet Take 1 tablet (100 mg total) by mouth 2 (two) times daily. 20 tablet 0  . ezetimibe (ZETIA) 10 MG tablet Take 1 tablet (10 mg total) by mouth daily. 90 tablet 3  . Krill Oil 350 MG CAPS Take 350 mg by mouth every evening.    . metoprolol tartrate (LOPRESSOR) 25 MG tablet Take 25 mg by mouth 2 (two) times daily.    . mirtazapine (REMERON) 15 MG tablet Take 15 mg by mouth at bedtime as needed (for panic associated with PTSD).     Marland Kitchen montelukast (SINGULAIR) 10 MG tablet Take 1 tablet (10 mg total) by mouth at bedtime. Start 2 days prior to infusion. Take it for 4 days. 60 tablet 0  . Multiple Vitamin  (MULTIVITAMIN WITH MINERALS) TABS tablet Take 1 tablet by mouth daily.    . mupirocin ointment (BACTROBAN) 2 % Apply 1 application topically 3 (three) times daily. 30 g 1  . nitroGLYCERIN (NITROSTAT) 0.4 MG SL tablet Place 1 tablet (0.4 mg total) under the tongue every 5 (five) minutes as needed for chest pain. 25 tablet 6  . tiotropium (SPIRIVA) 18 MCG inhalation capsule Place 1 capsule (18 mcg total) into inhaler and inhale at bedtime. 30 capsule 11  . traMADol (ULTRAM) 50 MG tablet TAKE 1 TABLET BY MOUTH EVERY 12 HOURS AS NEEDED FOR MODERATE PAIN 90 tablet 0   No current facility-administered medications for this visit.     PHYSICAL EXAMINATION: ECOG PERFORMANCE STATUS: 1 - Symptomatic but completely ambulatory  BP (!) 144/71 (BP Location: Left Arm, [name] Position: Sitting)   Pulse 64   Temp (!) 97.1 F (36.2 C) (Tympanic)   Resp 18   Wt 217 lb (98.4 kg)   SpO2 97%   BMI 32.05 kg/m   Filed Weights   01/03/19 0838  Weight: 217 lb (98.4 kg)    Physical Exam  Constitutional: [name] is oriented to person, place, and time and well-developed, well-nourished, and in no distress.    Accompanied by his daughter.  HENT:  Head: Normocephalic and atraumatic.  Mouth/Throat: Oropharynx is clear and moist. No oropharyngeal exudate.  Eyes: Pupils are equal, round, and reactive to light.  Neck: Normal range of motion. Neck supple.  Cardiovascular: Normal rate and regular rhythm.  Pulmonary/Chest: No respiratory distress. [name] has no wheezes.  Abdominal: Soft. Bowel sounds are normal. [name] exhibits no distension and no mass. There is no abdominal tenderness. There is no rebound and no guarding.  Large bruises noted in the left upper extremity and right flank.  Musculoskeletal: Normal range of motion.        General: No tenderness or edema.  Lymphadenopathy:  Resolved lymph nodes in the neck underarms.  Neurological: [name] is alert and oriented to person, place, and time.  Skin: Skin is warm.   Psychiatric: Affect normal.       LABORATORY DATA:  I  have reviewed the data as listed    Component Value Date/Time   NA 144 01/03/2019 0749   NA 139 10/11/2014 1800   K 4.0 01/03/2019 0749   K 3.3 (L) 10/11/2014 1800   CL 115 (H) 01/03/2019 0749   CL 106 10/11/2014 1800   CO2 22 01/03/2019 0749   CO2 27 10/11/2014 1800   GLUCOSE 170 (H) 01/03/2019 0749   GLUCOSE 107 (H) 10/11/2014 1800   BUN 30 (H) 01/03/2019 0749   BUN 15 10/11/2014 1800   CREATININE 0.96 01/03/2019 0749   CREATININE 0.89 10/11/2014 1800   CALCIUM 9.0 01/03/2019 0749   CALCIUM 8.8 (L) 10/11/2014 1800   PROT 7.0 01/03/2019 0749   PROT 6.7 05/18/2017 1048   PROT 6.4 (L) 10/11/2014 1800   ALBUMIN 4.0 01/03/2019 0749   ALBUMIN 4.3 05/18/2017 1048   ALBUMIN 4.1 10/11/2014 1800   AST 19 01/03/2019 0749   AST 23 10/11/2014 1800   ALT 23 01/03/2019 0749   ALT 22 10/11/2014 1800   ALKPHOS 91 01/03/2019 0749   ALKPHOS 61 10/11/2014 1800   BILITOT 0.6 01/03/2019 0749   BILITOT 0.7 05/18/2017 1048   BILITOT 0.9 10/11/2014 1800   GFRNONAA >60 01/03/2019 0749   GFRNONAA >60 10/11/2014 1800   GFRAA >60 01/03/2019 0749   GFRAA >60 10/11/2014 1800    No results found for: SPEP, UPEP  Lab Results  Component Value Date   WBC 63.7 (HH) 01/03/2019   NEUTROABS 9.4 (H) 01/03/2019   HGB 11.3 (L) 01/03/2019   HCT 36.1 (L) 01/03/2019   MCV 103.4 (H) 01/03/2019   PLT 138 (L) 01/03/2019      Chemistry      Component Value Date/Time   NA 144 01/03/2019 0749   NA 139 10/11/2014 1800   K 4.0 01/03/2019 0749   K 3.3 (L) 10/11/2014 1800   CL 115 (H) 01/03/2019 0749   CL 106 10/11/2014 1800   CO2 22 01/03/2019 0749   CO2 27 10/11/2014 1800   BUN 30 (H) 01/03/2019 0749   BUN 15 10/11/2014 1800   CREATININE 0.96 01/03/2019 0749   CREATININE 0.89 10/11/2014 1800      Component Value Date/Time   CALCIUM 9.0 01/03/2019 0749   CALCIUM 8.8 (L) 10/11/2014 1800   ALKPHOS 91 01/03/2019 0749   ALKPHOS 61  10/11/2014 1800   AST 19 01/03/2019 0749   AST 23 10/11/2014 1800   ALT 23 01/03/2019 0749   ALT 22 10/11/2014 1800   BILITOT 0.6 01/03/2019 0749   BILITOT 0.7 05/18/2017 1048   BILITOT 0.9 10/11/2014 1800       RADIOGRAPHIC STUDIES: I have personally reviewed the radiological images as listed and agreed with the findings in the report. No results found.   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  March 2020 CT scan shows significant improvement of adenopathy chest axilla/abdomen.  Stop in end of May 2020 because of poor tolerance.  #Proceed with Gazyva-cycle #1 today.)  Today 63,000; hemoglobin 11 lakelets 130s.  Status post premedication with Singulair/Benadryl/Tylenol/dexamethasone 2 days prior.  Will start [name] on venetoclax month #2; once white count is improved.  # Elevated blood pressure-150- 180s.  Continue current medication.   # Spontaneous bruising-secondary ibrutinib.  Improved.   # A.fib-on amiodarone sinus rhythm-on Eliquis; stable.   # TLS/infectious Prophylaxis-allopurinol; Bactrim/acyclovir.  DISPOSITION: # Gazyva today # in 1 weeks- MD- Dyann Kief;  labs- cbc/cmp/ldh-Dr.B  Orders Placed This Encounter  Procedures  . CBC with Differential    Standing Status:   Future    Standing Expiration Date:   01/03/2020  . Comprehensive metabolic panel    Standing Status:   Future    Standing Expiration Date:   01/03/2020  . Lactate dehydrogenase    Standing Status:   Future    Standing Expiration Date:   01/03/2020   All questions were answered. The [name] knows to call the clinic with any problems, questions or concerns.      Cammie Sickle, MD 01/03/2019 5:21 PM

## 2019-01-04 ENCOUNTER — Other Ambulatory Visit: Payer: Self-pay

## 2019-01-04 ENCOUNTER — Other Ambulatory Visit: Payer: Medicare HMO

## 2019-01-04 ENCOUNTER — Inpatient Hospital Stay: Payer: Medicare HMO

## 2019-01-04 ENCOUNTER — Ambulatory Visit: Payer: Medicare HMO | Admitting: Internal Medicine

## 2019-01-04 VITALS — BP 183/84 | HR 64 | Temp 96.0°F | Resp 18

## 2019-01-04 DIAGNOSIS — Z7189 Other specified counseling: Secondary | ICD-10-CM

## 2019-01-04 DIAGNOSIS — I10 Essential (primary) hypertension: Secondary | ICD-10-CM | POA: Diagnosis not present

## 2019-01-04 DIAGNOSIS — Z87891 Personal history of nicotine dependence: Secondary | ICD-10-CM | POA: Diagnosis not present

## 2019-01-04 DIAGNOSIS — I4891 Unspecified atrial fibrillation: Secondary | ICD-10-CM | POA: Diagnosis not present

## 2019-01-04 DIAGNOSIS — Z7901 Long term (current) use of anticoagulants: Secondary | ICD-10-CM | POA: Diagnosis not present

## 2019-01-04 DIAGNOSIS — C9110 Chronic lymphocytic leukemia of B-cell type not having achieved remission: Secondary | ICD-10-CM | POA: Diagnosis not present

## 2019-01-04 DIAGNOSIS — Z5112 Encounter for antineoplastic immunotherapy: Secondary | ICD-10-CM | POA: Diagnosis not present

## 2019-01-04 DIAGNOSIS — M791 Myalgia, unspecified site: Secondary | ICD-10-CM | POA: Diagnosis not present

## 2019-01-04 DIAGNOSIS — R233 Spontaneous ecchymoses: Secondary | ICD-10-CM | POA: Diagnosis not present

## 2019-01-04 LAB — HEPATITIS B CORE ANTIBODY, IGM: Hep B C IgM: NEGATIVE

## 2019-01-04 LAB — HEPATITIS B SURFACE ANTIGEN: Hepatitis B Surface Ag: NEGATIVE

## 2019-01-04 MED ORDER — SODIUM CHLORIDE 0.9 % IV SOLN
900.0000 mg | Freq: Once | INTRAVENOUS | Status: AC
  Administered 2019-01-04: 900 mg via INTRAVENOUS
  Filled 2019-01-04: qty 36

## 2019-01-04 MED ORDER — DIPHENHYDRAMINE HCL 50 MG/ML IJ SOLN
50.0000 mg | Freq: Once | INTRAMUSCULAR | Status: AC
  Administered 2019-01-04: 50 mg via INTRAVENOUS
  Filled 2019-01-04: qty 1

## 2019-01-04 MED ORDER — SODIUM CHLORIDE 0.9 % IV SOLN
Freq: Once | INTRAVENOUS | Status: AC
  Administered 2019-01-04: 09:00:00 via INTRAVENOUS
  Filled 2019-01-04: qty 250

## 2019-01-04 MED ORDER — ACETAMINOPHEN 325 MG PO TABS
650.0000 mg | ORAL_TABLET | Freq: Once | ORAL | Status: AC
  Administered 2019-01-04: 650 mg via ORAL
  Filled 2019-01-04: qty 2

## 2019-01-04 MED ORDER — SODIUM CHLORIDE 0.9 % IV SOLN
20.0000 mg | Freq: Once | INTRAVENOUS | Status: AC
  Administered 2019-01-04: 20 mg via INTRAVENOUS
  Filled 2019-01-04: qty 2

## 2019-01-04 NOTE — Progress Notes (Signed)
Pt tolerated infusion well, Pt and VS stable at discharge.

## 2019-01-07 ENCOUNTER — Other Ambulatory Visit: Payer: Self-pay

## 2019-01-10 ENCOUNTER — Encounter: Payer: Self-pay | Admitting: Internal Medicine

## 2019-01-10 ENCOUNTER — Inpatient Hospital Stay: Payer: Medicare HMO

## 2019-01-10 ENCOUNTER — Other Ambulatory Visit: Payer: Self-pay

## 2019-01-10 ENCOUNTER — Inpatient Hospital Stay (HOSPITAL_BASED_OUTPATIENT_CLINIC_OR_DEPARTMENT_OTHER): Payer: Medicare HMO | Admitting: Internal Medicine

## 2019-01-10 VITALS — BP 147/73 | HR 66 | Resp 20

## 2019-01-10 DIAGNOSIS — Z7189 Other specified counseling: Secondary | ICD-10-CM

## 2019-01-10 DIAGNOSIS — Z7901 Long term (current) use of anticoagulants: Secondary | ICD-10-CM | POA: Diagnosis not present

## 2019-01-10 DIAGNOSIS — C9110 Chronic lymphocytic leukemia of B-cell type not having achieved remission: Secondary | ICD-10-CM

## 2019-01-10 DIAGNOSIS — M791 Myalgia, unspecified site: Secondary | ICD-10-CM | POA: Diagnosis not present

## 2019-01-10 DIAGNOSIS — G4733 Obstructive sleep apnea (adult) (pediatric): Secondary | ICD-10-CM | POA: Diagnosis not present

## 2019-01-10 DIAGNOSIS — R5383 Other fatigue: Secondary | ICD-10-CM

## 2019-01-10 DIAGNOSIS — Z87891 Personal history of nicotine dependence: Secondary | ICD-10-CM

## 2019-01-10 DIAGNOSIS — I4891 Unspecified atrial fibrillation: Secondary | ICD-10-CM | POA: Diagnosis not present

## 2019-01-10 DIAGNOSIS — Z5112 Encounter for antineoplastic immunotherapy: Secondary | ICD-10-CM | POA: Diagnosis not present

## 2019-01-10 DIAGNOSIS — I10 Essential (primary) hypertension: Secondary | ICD-10-CM | POA: Diagnosis not present

## 2019-01-10 DIAGNOSIS — R233 Spontaneous ecchymoses: Secondary | ICD-10-CM | POA: Diagnosis not present

## 2019-01-10 LAB — COMPREHENSIVE METABOLIC PANEL
ALT: 39 U/L (ref 0–44)
AST: 16 U/L (ref 15–41)
Albumin: 3.5 g/dL (ref 3.5–5.0)
Alkaline Phosphatase: 71 U/L (ref 38–126)
Anion gap: 7 (ref 5–15)
BUN: 25 mg/dL — ABNORMAL HIGH (ref 8–23)
CO2: 21 mmol/L — ABNORMAL LOW (ref 22–32)
Calcium: 8.5 mg/dL — ABNORMAL LOW (ref 8.9–10.3)
Chloride: 111 mmol/L (ref 98–111)
Creatinine, Ser: 0.86 mg/dL (ref 0.61–1.24)
GFR calc Af Amer: >60 mL/min (ref >60)
GFR calc non Af Amer: >60 mL/min (ref >60)
Glucose, Bld: 191 mg/dL — ABNORMAL HIGH (ref 70–99)
Potassium: 3.9 mmol/L (ref 3.5–5.1)
Sodium: 139 mmol/L (ref 135–145)
Total Bilirubin: 0.9 mg/dL (ref 0.3–1.2)
Total Protein: 6.1 g/dL — ABNORMAL LOW (ref 6.5–8.1)

## 2019-01-10 LAB — CBC WITH DIFFERENTIAL/PLATELET
Abs Immature Granulocytes: 0 10*3/uL (ref 0.00–0.07)
Basophils Absolute: 0 10*3/uL (ref 0.0–0.1)
Basophils Relative: 0 %
Eosinophils Absolute: 0.2 10*3/uL (ref 0.0–0.5)
Eosinophils Relative: 1 %
HCT: 35.5 % — ABNORMAL LOW (ref 39.0–52.0)
Hemoglobin: 11.5 g/dL — ABNORMAL LOW (ref 13.0–17.0)
Lymphocytes Relative: 80 %
Lymphs Abs: 17.2 10*3/uL — ABNORMAL HIGH (ref 0.7–4.0)
MCH: 33 pg (ref 26.0–34.0)
MCHC: 32.4 g/dL (ref 30.0–36.0)
MCV: 101.7 fL — ABNORMAL HIGH (ref 80.0–100.0)
Monocytes Absolute: 0.6 10*3/uL (ref 0.1–1.0)
Monocytes Relative: 3 %
Neutro Abs: 3.4 10*3/uL (ref 1.7–7.7)
Neutrophils Relative %: 16 %
Platelets: 159 10*3/uL (ref 150–400)
RBC: 3.49 MIL/uL — ABNORMAL LOW (ref 4.22–5.81)
RDW: 16.7 % — ABNORMAL HIGH (ref 11.5–15.5)
Smear Review: NORMAL
WBC: 21.5 10*3/uL — ABNORMAL HIGH (ref 4.0–10.5)
nRBC: 0 % (ref 0.0–0.2)

## 2019-01-10 LAB — LACTATE DEHYDROGENASE: LDH: 140 U/L (ref 98–192)

## 2019-01-10 MED ORDER — SODIUM CHLORIDE 0.9 % IV SOLN
Freq: Once | INTRAVENOUS | Status: AC
  Administered 2019-01-10: 10:00:00 via INTRAVENOUS
  Filled 2019-01-10: qty 250

## 2019-01-10 MED ORDER — DIPHENHYDRAMINE HCL 50 MG/ML IJ SOLN
50.0000 mg | Freq: Once | INTRAMUSCULAR | Status: AC
  Administered 2019-01-10: 50 mg via INTRAVENOUS
  Filled 2019-01-10: qty 1

## 2019-01-10 MED ORDER — SODIUM CHLORIDE 0.9 % IV SOLN
1000.0000 mg | Freq: Once | INTRAVENOUS | Status: AC
  Administered 2019-01-10: 1000 mg via INTRAVENOUS
  Filled 2019-01-10: qty 40

## 2019-01-10 MED ORDER — ACETAMINOPHEN 325 MG PO TABS
650.0000 mg | ORAL_TABLET | Freq: Once | ORAL | Status: AC
  Administered 2019-01-10: 650 mg via ORAL
  Filled 2019-01-10: qty 2

## 2019-01-10 MED ORDER — SODIUM CHLORIDE 0.9 % IV SOLN
20.0000 mg | Freq: Once | INTRAVENOUS | Status: AC
  Administered 2019-01-10: 20 mg via INTRAVENOUS
  Filled 2019-01-10: qty 2

## 2019-01-10 NOTE — Assessment & Plan Note (Addendum)
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  # Proceed with Gazyva-cycle #1; D-8 today; today white count is 21 hemoglobin 11.5; platelets 154.  Will start patient on venetoclax month #2; once white count is improved.  # Elevated blood pressure-150s improved off ibrutinib.  # Spontaneous bruising-secondary ibrutinib.  Improved.   # A.fib-on amiodarone sinus rhythm-on Eliquis; stable.   # TLS/infectious Prophylaxis-allopurinol; Bactrim/acyclovir.  DISPOSITION: # Dyann Kief today # 1 weeks-Gazyva;  labs- cbc/cmp/ldh # in 3 weeks/29th- MD- Dyann Kief;  labs- cbc/cmp/ldh-Dr.B

## 2019-01-10 NOTE — Progress Notes (Signed)
[facility] OFFICE PROGRESS NOTE  Patient Care Team: Leone Haven, MD as PCP - General (Family Medicine) [name], MD as Consulting Physician (Cardiology) Cammie Sickle, MD as Medical Oncologist (Hematology and Oncology)  Cancer Staging No matching staging information was found for the patient.   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  MARCH 2017- CT N/C/A/P- NED. STOP Ritux; surveillance   # AUG 2019- CT/PET- progression/NO transformation;  # NOV 2019- Progression; started Ibrutinib 420 mg/d. STOPPED in end of feb sec to extreme fatigue/joint pains/cramps  #November 01, 2018-start ibrutinib 280 mg a day; December 20, 2018-discontinue ibrutinib secondary multiple side effects.   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      INTERVAL HISTORY:  Michael [id number].  male CLL-high risk currently on GDyann Kiefis here for follow-up.  Patient patient is currently status post cycle #1day 1 &2 of Gazyva.  He tolerated the treatment fairly well without any major side effects-acute reactions.  No nausea no vomiting.  Continues to have  mild to moderate fatigue.  Appetite is impaired.  Review of Systems  Constitutional: Positive for malaise/fatigue. Negative for chills, diaphoresis and fever.  HENT: Negative for nosebleeds and sore throat.   Eyes: Negative for double vision.  Respiratory: Negative for cough, hemoptysis, sputum production, shortness of breath and wheezing.   Cardiovascular: Negative for chest pain, palpitations, orthopnea and leg swelling.  Gastrointestinal: Negative for abdominal pain, blood in stool, constipation, diarrhea, heartburn, melena, nausea and vomiting.  Genitourinary: Negative for dysuria, frequency and urgency.  Musculoskeletal: Positive for back pain and joint pain.  Skin: Negative.  Negative for itching and rash.  Neurological: Negative for dizziness, tingling, focal weakness and headaches.  Psychiatric/Behavioral: Negative for depression. The patient is not nervous/anxious and does not have insomnia.       PAST MEDICAL HISTORY :  Past Medical History:  Diagnosis Date  . Anxiety   . Arthritis   . [place]rial fibrillation ([place]    a. Dx 2013, recurred 02/2014, CHA2DS2VASc = 3 -->placed on Eliquis;  b. 02/2014 Echo: EF 50-55%, mid and apical anterior septum and mid and apical inf septum are abnl, mild to mod Ao sclerosis w/o AS.  .Marland KitchenChicken pox   . [place]ocytic [place] ([place]    a. Dx 02/2014.  .Marland KitchenComplication of anesthesia    History of  PTSD--do not touch patient when waking up from surgery.  .Marland KitchenCOPD (chronic obstructive pulmonary disease) ([place]   . Coronary artery disease    a. 04/2009 CABG x 3 (LIMA->LAD, VG->OM1, VG->PDA);  b. 09/2009 Cath: occluded VG x 2 w/ patent LIMA and L->R collats. EF 55%, mild  antlat HK;  c. 10/2011 MV: EF 53%, no isch/infarct-->low risk.  Marland Kitchen Dysrhythmia    hx of a-fib  . GERD (gastroesophageal reflux disease)    occasional  . History of chemotherapy 2015-2016  . HOH (hard of hearing)    Bilateral Hearing Aids  . Hypertension   . Myocardial infarction ([place])  2010  . OSA on CPAP    USE C-PAP  . Presence of permanent cardiac pacemaker 2017  . PTSD (post-traumatic stress disorder)   . PTSD (post-traumatic stress disorder)   . Pure hypercholesterolemia   . Rheumatic fever 1959  . 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 ABDOMINAL HERNIA REPAIR    . APPENDECTOMY  06/21/1985  . CARDIAC CATHETERIZATION  2010; 2011   ; Dr Fletcher Anon  . CORONARY ARTERY BYPASS GRAFT  04/2009   "CABG X3"  . EP IMPLANTABLE DEVICE N/A 03/03/2016   Procedure: Pacemaker Implant;  Surgeon: Deboraha Sprang, MD;  Location: [facility];  Service: Cardiovascular;  Laterality: N/A;  . FOREIGN BODY REMOVAL  1968   "shrapnel in my tailbone"  . INGUINAL HERNIA REPAIR Right   . INSERT / REPLACE / REMOVE PACEMAKER    . JOINT REPLACEMENT Right 2018  . LAPAROSCOPIC CHOLECYSTECTOMY    . TONSILLECTOMY AND ADENOIDECTOMY  1956  . TOTAL HIP ARTHROPLASTY Right 10/22/2016   Procedure: TOTAL HIP ARTHROPLASTY;  Surgeon: Dereck Leep, MD;  Location: [facility];  Service: Orthopedics;  Laterality: Right;  . TOTAL HIP ARTHROPLASTY Left 11/04/2017   Procedure: TOTAL HIP ARTHROPLASTY;  Surgeon: Dereck Leep, MD;  Location: [facility];  Service: Orthopedics;  Laterality: Left;    FAMILY HISTORY :   Family History  Problem Relation Age of Onset  . Heart disease Mother   . Heart attack Mother   . Coronary artery disease Other        family history    SOCIAL HISTORY:   Social History   Tobacco Use  . Smoking status: Former Smoker    Packs/day: 1.00    Years: 40.00    Pack years: 40.00    Types: Cigarettes    Quit date: 07/21/2006    Years since quitting: 12.4  . Smokeless tobacco: Never Used  Substance Use Topics  . Alcohol use: Yes    Alcohol/week: 1.0 standard drinks    Types: 1 Standard drinks or equivalent per week    Comment: rarely  . Drug use: No    ALLERGIES:  has No Known  Allergies.  MEDICATIONS:  Current Outpatient Medications  Medication Sig Dispense Refill  . acetaminophen (TYLENOL) 500 MG tablet Take 1,000 mg by mouth every 8 (eight) hours as needed for mild pain.    Marland Kitchen acyclovir (ZOVIRAX) 400 MG tablet One pill a day [to prevent shingles] 30 tablet 3  . albuterol (PROVENTIL HFA;VENTOLIN HFA) 108 (90 Base) MCG/ACT inhaler Inhale 2 puffs into the lungs every 6 (six) hours as needed for wheezing or shortness of breath. 1 Inhaler 11  . allopurinol (ZYLOPRIM) 300 MG tablet Take 1 tablet (300 mg total) by mouth 2 (two) times daily. 120 tablet 0  . amiodarone (PACERONE) 200 MG tablet TAKE 1/2 TABLET (100 mg)  BY MOUTH TWICE DAILY 90 tablet 2  . apixaban (ELIQUIS) 5 MG TABS tablet Take 5 mg by mouth 2 (two) times daily.    Marland Kitchen atorvastatin (LIPITOR) 80 MG tablet  Take 1 tablet (80 mg total) by mouth at bedtime. 90 tablet 3  . budesonide-formoterol (SYMBICORT) 160-4.5 MCG/ACT inhaler Inhale 2 puffs into the lungs 2 (two) times daily. 1 Inhaler 11  . cetirizine (ZYRTEC) 10 MG tablet Take 10 mg by mouth daily as needed for allergies.     . Coenzyme Q10 (COQ10) 200 MG CAPS Take 200 mg by mouth daily.    Marland Kitchen dexamethasone (DECADRON) 4 MG tablet Start 2 days prior to infusion; Take for 2 days. Do not take on the day of infusion. 60 tablet 3  . doxycycline (VIBRA-TABS) 100 MG tablet Take 1 tablet (100 mg total) by mouth 2 (two) times daily. 20 tablet 0  . ezetimibe (ZETIA) 10 MG tablet Take 1 tablet (10 mg total) by mouth daily. 90 tablet 3  . Krill Oil 350 MG CAPS Take 350 mg by mouth every evening.    . metoprolol tartrate (LOPRESSOR) 25 MG tablet Take 25 mg by mouth 2 (two) times daily.    . mirtazapine (REMERON) 15 MG tablet Take 15 mg by mouth at bedtime as needed (for panic associated with PTSD).     Marland Kitchen montelukast (SINGULAIR) 10 MG tablet Take 1 tablet (10 mg total) by mouth at bedtime. Start 2 days prior to infusion. Take it for 4 days. 60 tablet 0  . Multiple Vitamin  (MULTIVITAMIN WITH MINERALS) TABS tablet Take 1 tablet by mouth daily.    . mupirocin ointment (BACTROBAN) 2 % Apply 1 application topically 3 (three) times daily. 30 g 1  . tiotropium (SPIRIVA) 18 MCG inhalation capsule Place 1 capsule (18 mcg total) into inhaler and inhale at bedtime. 30 capsule 11  . traMADol (ULTRAM) 50 MG tablet TAKE 1 TABLET BY MOUTH EVERY 12 HOURS AS NEEDED FOR MODERATE PAIN 90 tablet 0  . nitroGLYCERIN (NITROSTAT) 0.4 MG SL tablet Place 1 tablet (0.4 mg total) under the tongue every 5 (five) minutes as needed for chest pain. (Patient not taking: Reported on 01/10/2019) 25 tablet 6   No current facility-administered medications for this visit.     PHYSICAL EXAMINATION: ECOG PERFORMANCE STATUS: 1 - Symptomatic but completely ambulatory  BP 105/61 (BP Location: Left Arm, Patient Position: Sitting)   Pulse 67   Temp (!) 97.5 F (36.4 C) (Tympanic)   Resp 20   Ht '5\' 9"'$  (1.753 m)   Wt 217 lb 12.8 oz (98.8 kg)   BMI 32.16 kg/m   Filed Weights   01/10/19 0841  Weight: 217 lb 12.8 oz (98.8 kg)    Physical Exam  Constitutional: He is oriented to person, place, and time and well-developed, well-nourished, and in no distress.  Patient is alone.  HENT:  Head: Normocephalic and atraumatic.  Mouth/Throat: Oropharynx is clear and moist. No oropharyngeal exudate.  Eyes: Pupils are equal, round, and reactive to light.  Neck: Normal range of motion. Neck supple.  Cardiovascular: Normal rate and regular rhythm.  Pulmonary/Chest: No respiratory distress. He has no wheezes.  Abdominal: Soft. Bowel sounds are normal. He exhibits no distension and no mass. There is no abdominal tenderness. There is no rebound and no guarding.  Musculoskeletal: Normal range of motion.        General: No tenderness or edema.  Lymphadenopathy:  Resolved lymph nodes in the neck underarms.  Neurological: He is alert and oriented to person, place, and time.  Skin: Skin is warm.  Psychiatric:  Affect normal.       LABORATORY DATA:  I have reviewed the data  as listed    Component Value Date/Time   NA 139 01/10/2019 0800   NA 139 10/11/2014 1800   K 3.9 01/10/2019 0800   K 3.3 (L) 10/11/2014 1800   CL 111 01/10/2019 0800   CL 106 10/11/2014 1800   CO2 21 (L) 01/10/2019 0800   CO2 27 10/11/2014 1800   GLUCOSE 191 (H) 01/10/2019 0800   GLUCOSE 107 (H) 10/11/2014 1800   BUN 25 (H) 01/10/2019 0800   BUN 15 10/11/2014 1800   CREATININE 0.86 01/10/2019 0800   CREATININE 0.89 10/11/2014 1800   CALCIUM 8.5 (L) 01/10/2019 0800   CALCIUM 8.8 (L) 10/11/2014 1800   PROT 6.1 (L) 01/10/2019 0800   PROT 6.7 05/18/2017 1048   PROT 6.4 (L) 10/11/2014 1800   ALBUMIN 3.5 01/10/2019 0800   ALBUMIN 4.3 05/18/2017 1048   ALBUMIN 4.1 10/11/2014 1800   AST 16 01/10/2019 0800   AST 23 10/11/2014 1800   ALT 39 01/10/2019 0800   ALT 22 10/11/2014 1800   ALKPHOS 71 01/10/2019 0800   ALKPHOS 61 10/11/2014 1800   BILITOT 0.9 01/10/2019 0800   BILITOT 0.7 05/18/2017 1048   BILITOT 0.9 10/11/2014 1800   GFRNONAA >60 01/10/2019 0800   GFRNONAA >60 10/11/2014 1800   GFRAA >60 01/10/2019 0800   GFRAA >60 10/11/2014 1800    No results found for: SPEP, UPEP  Lab Results  Component Value Date   WBC 21.5 (H) 01/10/2019   NEUTROABS 3.4 01/10/2019   HGB 11.5 (L) 01/10/2019   HCT 35.5 (L) 01/10/2019   MCV 101.7 (H) 01/10/2019   PLT 159 01/10/2019      Chemistry      Component Value Date/Time   NA 139 01/10/2019 0800   NA 139 10/11/2014 1800   K 3.9 01/10/2019 0800   K 3.3 (L) 10/11/2014 1800   CL 111 01/10/2019 0800   CL 106 10/11/2014 1800   CO2 21 (L) 01/10/2019 0800   CO2 27 10/11/2014 1800   BUN 25 (H) 01/10/2019 0800   BUN 15 10/11/2014 1800   CREATININE 0.86 01/10/2019 0800   CREATININE 0.89 10/11/2014 1800      Component Value Date/Time   CALCIUM 8.5 (L) 01/10/2019 0800   CALCIUM 8.8 (L) 10/11/2014 1800   ALKPHOS 71 01/10/2019 0800   ALKPHOS 61 10/11/2014 1800    AST 16 01/10/2019 0800   AST 23 10/11/2014 1800   ALT 39 01/10/2019 0800   ALT 22 10/11/2014 1800   BILITOT 0.9 01/10/2019 0800   BILITOT 0.7 05/18/2017 1048   BILITOT 0.9 10/11/2014 1800       RADIOGRAPHIC STUDIES: I have personally reviewed the radiological images as listed and agreed with the findings in the report. No results found.   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  # Proceed with Gazyva-cycle #1; D-8 today; today white count is 21 hemoglobin 11.5; platelets 154.  Will start patient on venetoclax month #2; once white count is improved.  # Elevated blood pressure-150s improved off ibrutinib.  # Spontaneous bruising-secondary ibrutinib.  Improved.   # A.fib-on amiodarone sinus rhythm-on Eliquis; stable.   # TLS/infectious Prophylaxis-allopurinol; Bactrim/acyclovir.  DISPOSITION: # Dyann Kief today # 1 weeks-Gazyva;  labs- cbc/cmp/ldh # in 3 weeks/29th- MD- Dyann Kief;  labs- cbc/cmp/ldh-Dr.B    Orders Placed This Encounter  Procedures  . CBC with Differential    Standing Status:   Future    Standing Expiration Date:   01/10/2020  .  Comprehensive metabolic panel    Standing Status:   Future    Standing Expiration Date:   01/10/2020  . Lactate dehydrogenase    Standing Status:   Future    Standing Expiration Date:   01/10/2020  . CBC with Differential    Standing Status:   Future    Standing Expiration Date:   01/10/2020  . Comprehensive metabolic panel    Standing Status:   Future    Standing Expiration Date:   01/10/2020  . Lactate dehydrogenase    Standing Status:   Future    Standing Expiration Date:   01/10/2020   All questions were answered. The patient knows to call the clinic with any problems, questions or concerns.      Cammie Sickle, MD 01/12/2019 8:13 AM

## 2019-01-14 ENCOUNTER — Other Ambulatory Visit: Payer: Self-pay

## 2019-01-14 ENCOUNTER — Telehealth: Payer: Self-pay | Admitting: Internal Medicine

## 2019-01-14 NOTE — Telephone Encounter (Signed)
Spoke with pt sps Sheria Lang to confirm appt date/time, do pre-appt screen which was completed, and adv of Covid-19 guidelines for appt regarding screening questions, temperature check, face mask required, and no visitors allowed

## 2019-01-17 ENCOUNTER — Other Ambulatory Visit: Payer: Self-pay

## 2019-01-17 ENCOUNTER — Inpatient Hospital Stay: Payer: Medicare HMO

## 2019-01-17 VITALS — BP 147/75 | HR 65 | Temp 97.5°F | Wt 220.0 lb

## 2019-01-17 DIAGNOSIS — C9110 Chronic lymphocytic leukemia of B-cell type not having achieved remission: Secondary | ICD-10-CM

## 2019-01-17 DIAGNOSIS — M791 Myalgia, unspecified site: Secondary | ICD-10-CM | POA: Diagnosis not present

## 2019-01-17 DIAGNOSIS — I4891 Unspecified atrial fibrillation: Secondary | ICD-10-CM | POA: Diagnosis not present

## 2019-01-17 DIAGNOSIS — Z7189 Other specified counseling: Secondary | ICD-10-CM

## 2019-01-17 DIAGNOSIS — R233 Spontaneous ecchymoses: Secondary | ICD-10-CM | POA: Diagnosis not present

## 2019-01-17 DIAGNOSIS — Z7901 Long term (current) use of anticoagulants: Secondary | ICD-10-CM | POA: Diagnosis not present

## 2019-01-17 DIAGNOSIS — I10 Essential (primary) hypertension: Secondary | ICD-10-CM | POA: Diagnosis not present

## 2019-01-17 DIAGNOSIS — Z5112 Encounter for antineoplastic immunotherapy: Secondary | ICD-10-CM | POA: Diagnosis not present

## 2019-01-17 DIAGNOSIS — Z87891 Personal history of nicotine dependence: Secondary | ICD-10-CM | POA: Diagnosis not present

## 2019-01-17 LAB — CBC WITH DIFFERENTIAL/PLATELET
Abs Immature Granulocytes: 0.05 10*3/uL (ref 0.00–0.07)
Basophils Absolute: 0 10*3/uL (ref 0.0–0.1)
Basophils Relative: 0 %
Eosinophils Absolute: 0 10*3/uL (ref 0.0–0.5)
Eosinophils Relative: 0 %
HCT: 35.1 % — ABNORMAL LOW (ref 39.0–52.0)
Hemoglobin: 11.6 g/dL — ABNORMAL LOW (ref 13.0–17.0)
Immature Granulocytes: 0 %
Lymphocytes Relative: 62 %
Lymphs Abs: 9.5 10*3/uL — ABNORMAL HIGH (ref 0.7–4.0)
MCH: 33.4 pg (ref 26.0–34.0)
MCHC: 33 g/dL (ref 30.0–36.0)
MCV: 101.2 fL — ABNORMAL HIGH (ref 80.0–100.0)
Monocytes Absolute: 0.3 10*3/uL (ref 0.1–1.0)
Monocytes Relative: 2 %
Neutro Abs: 5.6 10*3/uL (ref 1.7–7.7)
Neutrophils Relative %: 36 %
Platelets: 121 10*3/uL — ABNORMAL LOW (ref 150–400)
RBC: 3.47 MIL/uL — ABNORMAL LOW (ref 4.22–5.81)
RDW: 16.6 % — ABNORMAL HIGH (ref 11.5–15.5)
WBC: 15.3 10*3/uL — ABNORMAL HIGH (ref 4.0–10.5)
nRBC: 0 % (ref 0.0–0.2)

## 2019-01-17 LAB — COMPREHENSIVE METABOLIC PANEL
ALT: 34 U/L (ref 0–44)
AST: 20 U/L (ref 15–41)
Albumin: 3.5 g/dL (ref 3.5–5.0)
Alkaline Phosphatase: 69 U/L (ref 38–126)
Anion gap: 5 (ref 5–15)
BUN: 27 mg/dL — ABNORMAL HIGH (ref 8–23)
CO2: 23 mmol/L (ref 22–32)
Calcium: 8.4 mg/dL — ABNORMAL LOW (ref 8.9–10.3)
Chloride: 112 mmol/L — ABNORMAL HIGH (ref 98–111)
Creatinine, Ser: 0.83 mg/dL (ref 0.61–1.24)
GFR calc Af Amer: >60 mL/min (ref >60)
GFR calc non Af Amer: >60 mL/min (ref >60)
Glucose, Bld: 203 mg/dL — ABNORMAL HIGH (ref 70–99)
Potassium: 4.3 mmol/L (ref 3.5–5.1)
Sodium: 140 mmol/L (ref 135–145)
Total Bilirubin: 0.9 mg/dL (ref 0.3–1.2)
Total Protein: 6.2 g/dL — ABNORMAL LOW (ref 6.5–8.1)

## 2019-01-17 LAB — LACTATE DEHYDROGENASE: LDH: 143 U/L (ref 98–192)

## 2019-01-17 MED ORDER — ACETAMINOPHEN 325 MG PO TABS
650.0000 mg | ORAL_TABLET | Freq: Once | ORAL | Status: AC
  Administered 2019-01-17: 650 mg via ORAL
  Filled 2019-01-17: qty 2

## 2019-01-17 MED ORDER — SODIUM CHLORIDE 0.9 % IV SOLN
20.0000 mg | Freq: Once | INTRAVENOUS | Status: AC
  Administered 2019-01-17: 09:00:00 20 mg via INTRAVENOUS
  Filled 2019-01-17: qty 2

## 2019-01-17 MED ORDER — SODIUM CHLORIDE 0.9 % IV SOLN
1000.0000 mg | Freq: Once | INTRAVENOUS | Status: AC
  Administered 2019-01-17: 1000 mg via INTRAVENOUS
  Filled 2019-01-17: qty 40

## 2019-01-17 MED ORDER — DIPHENHYDRAMINE HCL 50 MG/ML IJ SOLN
50.0000 mg | Freq: Once | INTRAMUSCULAR | Status: AC
  Administered 2019-01-17: 50 mg via INTRAVENOUS
  Filled 2019-01-17: qty 1

## 2019-01-17 MED ORDER — SODIUM CHLORIDE 0.9 % IV SOLN
Freq: Once | INTRAVENOUS | Status: AC
  Administered 2019-01-17: 09:00:00 via INTRAVENOUS
  Filled 2019-01-17: qty 250

## 2019-01-28 ENCOUNTER — Other Ambulatory Visit: Payer: Self-pay

## 2019-01-31 ENCOUNTER — Inpatient Hospital Stay: Payer: Medicare HMO | Attending: Internal Medicine

## 2019-01-31 ENCOUNTER — Other Ambulatory Visit: Payer: Self-pay | Admitting: *Deleted

## 2019-01-31 ENCOUNTER — Other Ambulatory Visit: Payer: Self-pay

## 2019-01-31 ENCOUNTER — Telehealth: Payer: Self-pay | Admitting: Pharmacist

## 2019-01-31 ENCOUNTER — Inpatient Hospital Stay (HOSPITAL_BASED_OUTPATIENT_CLINIC_OR_DEPARTMENT_OTHER): Payer: Medicare HMO | Admitting: Internal Medicine

## 2019-01-31 ENCOUNTER — Inpatient Hospital Stay: Payer: Medicare HMO

## 2019-01-31 ENCOUNTER — Other Ambulatory Visit: Payer: Self-pay | Admitting: Oncology

## 2019-01-31 ENCOUNTER — Other Ambulatory Visit: Payer: Self-pay | Admitting: Internal Medicine

## 2019-01-31 VITALS — BP 113/63 | HR 65 | Temp 96.5°F | Resp 20 | Ht 69.0 in | Wt 220.0 lb

## 2019-01-31 DIAGNOSIS — C9110 Chronic lymphocytic leukemia of B-cell type not having achieved remission: Secondary | ICD-10-CM | POA: Insufficient documentation

## 2019-01-31 DIAGNOSIS — I4891 Unspecified atrial fibrillation: Secondary | ICD-10-CM | POA: Diagnosis not present

## 2019-01-31 DIAGNOSIS — Z7901 Long term (current) use of anticoagulants: Secondary | ICD-10-CM | POA: Diagnosis not present

## 2019-01-31 DIAGNOSIS — Z7189 Other specified counseling: Secondary | ICD-10-CM

## 2019-01-31 DIAGNOSIS — R233 Spontaneous ecchymoses: Secondary | ICD-10-CM

## 2019-01-31 DIAGNOSIS — Z87891 Personal history of nicotine dependence: Secondary | ICD-10-CM | POA: Diagnosis not present

## 2019-01-31 DIAGNOSIS — R5383 Other fatigue: Secondary | ICD-10-CM | POA: Diagnosis not present

## 2019-01-31 DIAGNOSIS — Z5112 Encounter for antineoplastic immunotherapy: Secondary | ICD-10-CM | POA: Insufficient documentation

## 2019-01-31 LAB — COMPREHENSIVE METABOLIC PANEL
ALT: 28 U/L (ref 0–44)
AST: 26 U/L (ref 15–41)
Albumin: 3.6 g/dL (ref 3.5–5.0)
Alkaline Phosphatase: 73 U/L (ref 38–126)
Anion gap: 6 (ref 5–15)
BUN: 29 mg/dL — ABNORMAL HIGH (ref 8–23)
CO2: 22 mmol/L (ref 22–32)
Calcium: 8.8 mg/dL — ABNORMAL LOW (ref 8.9–10.3)
Chloride: 112 mmol/L — ABNORMAL HIGH (ref 98–111)
Creatinine, Ser: 0.93 mg/dL (ref 0.61–1.24)
GFR calc Af Amer: >60 mL/min (ref >60)
GFR calc non Af Amer: >60 mL/min (ref >60)
Glucose, Bld: 202 mg/dL — ABNORMAL HIGH (ref 70–99)
Potassium: 3.9 mmol/L (ref 3.5–5.1)
Sodium: 140 mmol/L (ref 135–145)
Total Bilirubin: 1 mg/dL (ref 0.3–1.2)
Total Protein: 6.4 g/dL — ABNORMAL LOW (ref 6.5–8.1)

## 2019-01-31 LAB — CBC WITH DIFFERENTIAL/PLATELET
Abs Immature Granulocytes: 0.05 10*3/uL (ref 0.00–0.07)
Basophils Absolute: 0 10*3/uL (ref 0.0–0.1)
Basophils Relative: 0 %
Eosinophils Absolute: 0 10*3/uL (ref 0.0–0.5)
Eosinophils Relative: 0 %
HCT: 34.7 % — ABNORMAL LOW (ref 39.0–52.0)
Hemoglobin: 11.3 g/dL — ABNORMAL LOW (ref 13.0–17.0)
Immature Granulocytes: 1 %
Lymphocytes Relative: 37 %
Lymphs Abs: 3.9 10*3/uL (ref 0.7–4.0)
MCH: 32.9 pg (ref 26.0–34.0)
MCHC: 32.6 g/dL (ref 30.0–36.0)
MCV: 101.2 fL — ABNORMAL HIGH (ref 80.0–100.0)
Monocytes Absolute: 0.4 10*3/uL (ref 0.1–1.0)
Monocytes Relative: 4 %
Neutro Abs: 6.2 10*3/uL (ref 1.7–7.7)
Neutrophils Relative %: 58 %
Platelets: 162 10*3/uL (ref 150–400)
RBC: 3.43 MIL/uL — ABNORMAL LOW (ref 4.22–5.81)
RDW: 15.4 % (ref 11.5–15.5)
WBC: 10.6 10*3/uL — ABNORMAL HIGH (ref 4.0–10.5)
nRBC: 0 % (ref 0.0–0.2)

## 2019-01-31 LAB — LACTATE DEHYDROGENASE: LDH: 173 U/L (ref 98–192)

## 2019-01-31 MED ORDER — SODIUM CHLORIDE 0.9 % IV SOLN
20.0000 mg | Freq: Once | INTRAVENOUS | Status: AC
  Administered 2019-01-31: 20 mg via INTRAVENOUS
  Filled 2019-01-31: qty 2

## 2019-01-31 MED ORDER — SODIUM CHLORIDE 0.9 % IV SOLN
Freq: Once | INTRAVENOUS | Status: AC
  Administered 2019-01-31: 10:00:00 via INTRAVENOUS
  Filled 2019-01-31: qty 250

## 2019-01-31 MED ORDER — SODIUM CHLORIDE 0.9 % IV SOLN
1000.0000 mg | Freq: Once | INTRAVENOUS | Status: AC
  Administered 2019-01-31: 1000 mg via INTRAVENOUS
  Filled 2019-01-31: qty 40

## 2019-01-31 MED ORDER — ACETAMINOPHEN 325 MG PO TABS
650.0000 mg | ORAL_TABLET | Freq: Once | ORAL | Status: AC
  Administered 2019-01-31: 10:00:00 650 mg via ORAL
  Filled 2019-01-31: qty 2

## 2019-01-31 MED ORDER — DIPHENHYDRAMINE HCL 50 MG/ML IJ SOLN
50.0000 mg | Freq: Once | INTRAMUSCULAR | Status: AC
  Administered 2019-01-31: 50 mg via INTRAVENOUS
  Filled 2019-01-31: qty 1

## 2019-01-31 NOTE — Telephone Encounter (Signed)
Oral Oncology Pharmacist Encounter  Received new prescription for Venclexta (venetoclax) for the treatment of CLL in conjunction with obinutuzumab, planned duration until disease progression or unacceptable drug toxicity. The plan is for [name] to begin the venetoclax ramp-up in ~4 weeks.  CBC/CMP from 01/31/2019 assessed, no relevant lab abnormalities. He will have additional lab monitoring with the ramp up of venetoclax. Prescription dose and frequency assessed.   Current medication list in Epic reviewed, one DDIs with venetoclax identified: -Amiodarone may increase the concentration of venetoclax. If on both medications, the dose of venetoclax should be reduced by at least 50%. Spoke with Dr. Rogue Bussing and he is going to speak with cardiology about the interaction.   Prescription has been e-scribed to the [facility] for benefits analysis and approval.  Oral Oncology Clinic will continue to follow for insurance authorization, copayment issues, initial counseling and start date.  Darl Pikes, PharmD, BCPS, [facility] Hematology/Oncology Clinical Pharmacist [facility]/AP [facility] [phone]  01/31/2019 10:19 AM

## 2019-01-31 NOTE — Assessment & Plan Note (Signed)
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  # Proceed with Gazyva-cycle #2; D-1 today; today white count is 10 hemoglobin 11.5; platelets 162.  Will start patient on venetoclax month #3; once white count is improved. Will plan to get CT N/C/A/P prior.   # Spontaneous bruising-secondary ibrutinib.  Improved.   # A.fib-on amiodarone sinus rhythm-on Eliquis; stable.   # TLS/infectious Prophylaxis-allopurinol; Bactrim/acyclovir.  DISPOSITION: # Dyann Kief today # in 4 weeks/ MD- Gazyva;  labs- cbc/cmp/ldh; CT scans- neck/Chest/ab/pelvis-Dr.B  Cc; Alysson- re: venatoclax.

## 2019-01-31 NOTE — Progress Notes (Signed)
x

## 2019-01-31 NOTE — Progress Notes (Signed)
[name] OFFICE PROGRESS NOTE  Patient Care Team: Leone Haven, MD as PCP - General (Family Medicine) [name], MD as Consulting Physician (Cardiology) Cammie Sickle, MD as Medical Oncologist (Hematology and Oncology)  Cancer Staging No matching staging information was found for the patient.   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  MARCH 2017- CT N/C/A/P- NED. STOP Ritux; surveillance   # AUG 2019- CT/PET- progression/NO transformation;  # NOV 2019- Progression; started Ibrutinib 420 mg/d. STOPPED in end of feb sec to extreme fatigue/joint pains/cramps  #November 01, 2018-start ibrutinib 280 mg a day; December 20, 2018-discontinue ibrutinib secondary multiple side effects.   #[place] 03, [place]    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; IGVH- UN-MUTATED [poor prognosis]  DIAGNOSIS: CLL  STAGE: IV  ;GOALS: control  CURRENT/MOST RECENT THERAPY : Gazyva + venetoclax [pending]    CLL (ch[place] lymphocytic [place]) ([place]  01/03/2019 -  Chemotherapy   The patient had obinutuzumab (GAZYVA) 100 mg in sodium chloride 0.9 % 100 mL (0.9615 mg/mL) chemo infusion, 100 mg, Intravenous, Once, 1 of 6 cycles Administration: 100 mg (01/03/2019), 900 mg (01/04/2019), 1,000 mg (01/10/2019), 1,000 mg (01/17/2019)  for chemotherapy treatment.      INTERVAL HISTORY:  Michael [place]89y.o.  male CLL-high risk currently on GDyann Kiefis here for follow-up.  Patient finished cycle #1 of Gazyva.-Tolerated without any major side effects.  Continues to complain of easy bruising.  Otherwise mild to moderate fatigue.  Increased  appetite/weight gain from steroids.  Otherwise no nausea no vomiting.  No fevers or chills.  Review of Systems  Constitutional: Positive for malaise/fatigue. Negative for chills, diaphoresis and fever.  HENT: Negative for nosebleeds and sore throat.   Eyes: Negative for double vision.  Respiratory: Negative for cough, hemoptysis, sputum production, shortness of breath and wheezing.   Cardiovascular: Negative for chest pain, palpitations, orthopnea and leg swelling.  Gastrointestinal: Negative for abdominal pain, blood in stool, constipation, diarrhea, heartburn, melena, nausea and vomiting.  Genitourinary: Negative for dysuria, frequency and urgency.  Musculoskeletal: Positive for back pain and joint pain.  Skin: Negative.  Negative for itching and rash.  Neurological: Negative for dizziness, tingling, focal weakness and headaches.  Psychiatric/Behavioral: Negative for depression. The patient is not nervous/anxious and does not have insomnia.       PAST MEDICAL HISTORY :  Past Medical History:  Diagnosis Date  . Anxiety   . Arthritis   . Atrial fibrillation ([place]    a. Dx 2013, recurred 02/2014, CHA2DS2VASc = 3 -->placed on Eliquis;  b. 02/2014 Echo: EF 50-55%, mid and apical anterior septum and mid and apical inf septum are abnl, mild to mod Ao sclerosis w/o AS.  .Marland KitchenChicken pox   . [place]ocytic [place] ([place]    a. Dx 02/2014.  .Marland KitchenComplication of anesthesia    History of  PTSD--do not touch patient when waking up from surgery.  .Marland KitchenCOPD (chronic obstructive pulmonary disease) ([place]   . Coronary artery disease    a. 04/2009 CABG x 3 (LIMA->LAD, VG->OM1, VG->PDA);  b. 09/2009 Cath: occluded VG x 2 w/ patent LIMA and L->R collats. EF  55%, mild antlat HK;  c. 10/2011 MV: EF 53%, no isch/infarct-->low risk.  Marland Kitchen Dysrhythmia    hx of a-fib  . GERD (gastroesophageal reflux disease)    occasional  . History of chemotherapy 2015-2016  . HOH (hard of hearing)    Bilateral Hearing Aids  .  Hypertension   . Myocardial infarction ([place]) 2010  . OSA on CPAP    USE C-PAP  . Presence of permanent cardiac pacemaker 2017  . PTSD (post-traumatic stress disorder)   . PTSD (post-traumatic stress disorder)   . Pure hypercholesterolemia   . Rheumatic fever 1959  . 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 ABDOMINAL HERNIA REPAIR    . APPENDECTOMY  06/21/1985  . CARDIAC CATHETERIZATION  2010; 2011   ; Dr Fletcher Anon  . CORONARY ARTERY BYPASS GRAFT  04/2009   "CABG X3"  . EP IMPLANTABLE DEVICE N/A 03/03/2016   Procedure: Pacemaker Implant;  Surgeon: Deboraha Sprang, MD;  Location: [facility];  Service: Cardiovascular;  Laterality: N/A;  . FOREIGN BODY REMOVAL  1968   "shrapnel in my tailbone"  . INGUINAL HERNIA REPAIR Right   . INSERT / REPLACE / REMOVE PACEMAKER    . JOINT REPLACEMENT Right 2018  . LAPAROSCOPIC CHOLECYSTECTOMY    . TONSILLECTOMY AND ADENOIDECTOMY  1956  . TOTAL HIP ARTHROPLASTY Right 10/22/2016   Procedure: TOTAL HIP ARTHROPLASTY;  Surgeon: Dereck Leep, MD;  Location: [facility];  Service: Orthopedics;  Laterality: Right;  . TOTAL HIP ARTHROPLASTY Left 11/04/2017   Procedure: TOTAL HIP ARTHROPLASTY;  Surgeon: Dereck Leep, MD;  Location: [facility];  Service: Orthopedics;  Laterality: Left;    FAMILY HISTORY :   Family History  Problem Relation Age of Onset  . Heart disease Mother   . Heart attack Mother   . Coronary artery disease Other        family history    SOCIAL HISTORY:   Social History   Tobacco Use  . Smoking status: Former Smoker    Packs/day: 1.00    Years: 40.00    Pack years: 40.00    Types: Cigarettes    Quit date: 07/21/2006    Years since quitting: 12.5  . Smokeless tobacco: Never Used  Substance Use Topics  . Alcohol use: Yes    Alcohol/week: 1.0 standard drinks    Types: 1 Standard drinks or equivalent per week    Comment: rarely   . Drug use: No    ALLERGIES:  has No Known Allergies.  MEDICATIONS:  Current Outpatient Medications  Medication Sig Dispense Refill  . acetaminophen (TYLENOL) 500 MG tablet Take 1,000 mg by mouth every 8 (eight) hours as needed for mild pain.    Marland Kitchen acyclovir (ZOVIRAX) 400 MG tablet One pill a day [to prevent shingles] 30 tablet 3  . albuterol (PROVENTIL HFA;VENTOLIN HFA) 108 (90 Base) MCG/ACT inhaler Inhale 2 puffs into the lungs every 6 (six) hours as needed for wheezing or shortness of breath. 1 Inhaler 11  . allopurinol (ZYLOPRIM) 300 MG tablet Take 1 tablet (300 mg total) by mouth 2 (two) times daily. 120 tablet 0  . amiodarone (PACERONE) 200 MG tablet TAKE 1/2 TABLET (100 mg)  BY MOUTH TWICE DAILY 90 tablet 2  . apixaban (ELIQUIS) 5 MG TABS tablet Take 5 mg by mouth 2 (two) times daily.    Marland Kitchen atorvastatin (LIPITOR) 80  MG tablet Take 1 tablet (80 mg total) by mouth at bedtime. 90 tablet 3  . budesonide-formoterol (SYMBICORT) 160-4.5 MCG/ACT inhaler Inhale 2 puffs into the lungs 2 (two) times daily. 1 Inhaler 11  . cetirizine (ZYRTEC) 10 MG tablet Take 10 mg by mouth daily as needed for allergies.     . Coenzyme Q10 (COQ10) 200 MG CAPS Take 200 mg by mouth daily.    Marland Kitchen dexamethasone (DECADRON) 4 MG tablet Start 2 days prior to infusion; Take for 2 days. Do not take on the day of infusion. 60 tablet 3  . ezetimibe (ZETIA) 10 MG tablet Take 1 tablet (10 mg total) by mouth daily. 90 tablet 3  . Krill Oil 350 MG CAPS Take 350 mg by mouth every evening.    . metoprolol tartrate (LOPRESSOR) 25 MG tablet Take 25 mg by mouth 2 (two) times daily.    . mirtazapine (REMERON) 15 MG tablet Take 15 mg by mouth at bedtime as needed (for panic associated with PTSD).     Marland Kitchen montelukast (SINGULAIR) 10 MG tablet Take 1 tablet (10 mg total) by mouth at bedtime. Start 2 days prior to infusion. Take it for 4 days. 60 tablet 0  . Multiple Vitamin (MULTIVITAMIN WITH MINERALS) TABS tablet Take 1 tablet by mouth  daily.    . mupirocin ointment (BACTROBAN) 2 % Apply 1 application topically 3 (three) times daily. 30 g 1  . tiotropium (SPIRIVA) 18 MCG inhalation capsule Place 1 capsule (18 mcg total) into inhaler and inhale at bedtime. 30 capsule 11  . traMADol (ULTRAM) 50 MG tablet TAKE 1 TABLET BY MOUTH EVERY 12 HOURS AS NEEDED FOR MODERATE PAIN 90 tablet 0  . nitroGLYCERIN (NITROSTAT) 0.4 MG SL tablet Place 1 tablet (0.4 mg total) under the tongue every 5 (five) minutes as needed for chest pain. (Patient not taking: Reported on 01/10/2019) 25 tablet 6   No current facility-administered medications for this visit.     PHYSICAL EXAMINATION: ECOG PERFORMANCE STATUS: 1 - Symptomatic but completely ambulatory  BP 113/63 (BP Location: Left Arm, Patient Position: Sitting, Cuff Size: Normal)   Pulse 65   Temp (!) 96.5 F (35.8 C) (Tympanic)   Resp 20   Ht '5\' 9"'$  (1.753 m)   Wt 220 lb (99.8 kg)   BMI 32.49 kg/m   Filed Weights   01/31/19 0832  Weight: 220 lb (99.8 kg)    Physical Exam  Constitutional: He is oriented to person, place, and time and well-developed, well-nourished, and in no distress.  Patient is alone.  HENT:  Head: Normocephalic and atraumatic.  Mouth/Throat: Oropharynx is clear and moist. No oropharyngeal exudate.  Eyes: Pupils are equal, round, and reactive to light.  Neck: Normal range of motion. Neck supple.  Cardiovascular: Normal rate and regular rhythm.  Pulmonary/Chest: No respiratory distress. He has no wheezes.  Abdominal: Soft. Bowel sounds are normal. He exhibits no distension and no mass. There is no abdominal tenderness. There is no rebound and no guarding.  Musculoskeletal: Normal range of motion.        General: No tenderness or edema.  Lymphadenopathy:  Resolved lymph nodes in the neck underarms.  Neurological: He is alert and oriented to person, place, and time.  Skin: Skin is warm.  Psychiatric: Affect normal.       LABORATORY DATA:  I have reviewed  the data as listed    Component Value Date/Time   NA 140 01/31/2019 0808   NA 139 10/11/2014 1800  K 3.9 01/31/2019 0808   K 3.3 (L) 10/11/2014 1800   CL 112 (H) 01/31/2019 0808   CL 106 10/11/2014 1800   CO2 22 01/31/2019 0808   CO2 27 10/11/2014 1800   GLUCOSE 202 (H) 01/31/2019 0808   GLUCOSE 107 (H) 10/11/2014 1800   BUN 29 (H) 01/31/2019 0808   BUN 15 10/11/2014 1800   CREATININE 0.93 01/31/2019 0808   CREATININE 0.89 10/11/2014 1800   CALCIUM 8.8 (L) 01/31/2019 0808   CALCIUM 8.8 (L) 10/11/2014 1800   PROT 6.4 (L) 01/31/2019 0808   PROT 6.7 05/18/2017 1048   PROT 6.4 (L) 10/11/2014 1800   ALBUMIN 3.6 01/31/2019 0808   ALBUMIN 4.3 05/18/2017 1048   ALBUMIN 4.1 10/11/2014 1800   AST 26 01/31/2019 0808   AST 23 10/11/2014 1800   ALT 28 01/31/2019 0808   ALT 22 10/11/2014 1800   ALKPHOS 73 01/31/2019 0808   ALKPHOS 61 10/11/2014 1800   BILITOT 1.0 01/31/2019 0808   BILITOT 0.7 05/18/2017 1048   BILITOT 0.9 10/11/2014 1800   GFRNONAA >60 01/31/2019 0808   GFRNONAA >60 10/11/2014 1800   GFRAA >60 01/31/2019 0808   GFRAA >60 10/11/2014 1800    No results found for: SPEP, UPEP  Lab Results  Component Value Date   WBC 10.6 (H) 01/31/2019   NEUTROABS 6.2 01/31/2019   HGB 11.3 (L) 01/31/2019   HCT 34.7 (L) 01/31/2019   MCV 101.2 (H) 01/31/2019   PLT 162 01/31/2019      Chemistry      Component Value Date/Time   NA 140 01/31/2019 0808   NA 139 10/11/2014 1800   K 3.9 01/31/2019 0808   K 3.3 (L) 10/11/2014 1800   CL 112 (H) 01/31/2019 0808   CL 106 10/11/2014 1800   CO2 22 01/31/2019 0808   CO2 27 10/11/2014 1800   BUN 29 (H) 01/31/2019 0808   BUN 15 10/11/2014 1800   CREATININE 0.93 01/31/2019 0808   CREATININE 0.89 10/11/2014 1800      Component Value Date/Time   CALCIUM 8.8 (L) 01/31/2019 0808   CALCIUM 8.8 (L) 10/11/2014 1800   ALKPHOS 73 01/31/2019 0808   ALKPHOS 61 10/11/2014 1800   AST 26 01/31/2019 0808   AST 23 10/11/2014 1800   ALT 28  01/31/2019 0808   ALT 22 10/11/2014 1800   BILITOT 1.0 01/31/2019 0808   BILITOT 0.7 05/18/2017 1048   BILITOT 0.9 10/11/2014 1800       RADIOGRAPHIC STUDIES: I have personally reviewed the radiological images as listed and agreed with the findings in the report. No results found.   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  # Proceed with Gazyva-cycle #2; D-1 today; today white count is 10 hemoglobin 11.5; platelets 162.  Will start patient on venetoclax month #3; once white count is improved. Will plan to get CT N/C/A/P prior.   # Spontaneous bruising-secondary ibrutinib.  Improved.   # A.fib-on amiodarone sinus rhythm-on Eliquis; stable.   # TLS/infectious Prophylaxis-allopurinol; Bactrim/acyclovir.  DISPOSITION: # Dyann Kief today # in 4 weeks/ MD- Gazyva;  labs- cbc/cmp/ldh; CT scans- neck/Chest/ab/pelvis-Dr.B  Cc; [facility]- re: venatoclax.     Orders Placed This Encounter  Procedures  . CT CHEST W CONTRAST    Standing Status:   Future    Standing Expiration Date:   01/31/2020    Order Specific Question:   If indicated for the ordered procedure, I authorize the administration  of contrast media per Radiology protocol    Answer:   Yes    Order Specific Question:   Preferred imaging location?    Answer:   [facility]    Order Specific Question:   Radiology Contrast Protocol - do NOT remove file path    Answer:   [email or web address]    Order Specific Question:   ** REASON FOR EXAM (FREE TEXT)    Answer:   CLL  . CT ABDOMEN PELVIS W CONTRAST    Standing Status:   Future    Standing Expiration Date:   01/31/2020    Order Specific Question:   If indicated for the ordered procedure, I authorize the administration of contrast media per Radiology protocol    Answer:   Yes    Order Specific Question:   Preferred imaging location?    Answer:   [facility]     Order Specific Question:   Radiology Contrast Protocol - do NOT remove file path    Answer:   [email or web address]    Order Specific Question:   ** REASON FOR EXAM (FREE TEXT)    Answer:   CLL  . CT SOFT TISSUE NECK W CONTRAST    Standing Status:   Future    Standing Expiration Date:   05/02/2020    Order Specific Question:   ** REASON FOR EXAM (FREE TEXT)    Answer:   CLL    Order Specific Question:   If indicated for the ordered procedure, I authorize the administration of contrast media per Radiology protocol    Answer:   Yes    Order Specific Question:   Preferred imaging location?    Answer:   [facility]    Order Specific Question:   Radiology Contrast Protocol - do NOT remove file path    Answer:   \\[email or web address]   All questions were answered. The patient knows to call the clinic with any problems, questions or concerns.      Cammie Sickle, MD 01/31/2019 9:10 AM

## 2019-02-01 DIAGNOSIS — C44619 Basal cell carcinoma of skin of left upper limb, including shoulder: Secondary | ICD-10-CM | POA: Diagnosis not present

## 2019-02-01 DIAGNOSIS — D485 Neoplasm of uncertain behavior of skin: Secondary | ICD-10-CM | POA: Diagnosis not present

## 2019-02-01 DIAGNOSIS — C44629 Squamous cell carcinoma of skin of left upper limb, including shoulder: Secondary | ICD-10-CM | POA: Diagnosis not present

## 2019-02-01 DIAGNOSIS — D2362 Other benign neoplasm of skin of left upper limb, including shoulder: Secondary | ICD-10-CM | POA: Diagnosis not present

## 2019-02-15 ENCOUNTER — Telehealth: Payer: Self-pay | Admitting: Pharmacy Technician

## 2019-02-15 DIAGNOSIS — C44629 Squamous cell carcinoma of skin of left upper limb, including shoulder: Secondary | ICD-10-CM | POA: Diagnosis not present

## 2019-02-15 NOTE — Telephone Encounter (Signed)
Oral Oncology Patient Advocate Encounter   Was successful in securing patient an $26 grant from Patient [facility]) to provide copayment coverage for Venclexta.  This will keep the out of pocket expense at $0.    The billing information is as follows and has been shared with [place].   Member ID: [id number] Group ID: [id number] RxBin: [id number] Dates of Eligibility: 03/09/2019 through 03/07/2020  [place] [facility] [place] Phone [phone] Fax [phone] 02/15/2019 12:45 PM

## 2019-02-23 ENCOUNTER — Other Ambulatory Visit: Payer: Self-pay

## 2019-02-23 ENCOUNTER — Other Ambulatory Visit: Payer: Medicare HMO

## 2019-02-23 ENCOUNTER — Ambulatory Visit
Admission: RE | Admit: 2019-02-23 | Discharge: 2019-02-23 | Disposition: A | Discharge status: Home / Self Care | Payer: Medicare HMO | Source: Ambulatory Visit | Attending: Internal Medicine | Admitting: Internal Medicine

## 2019-02-23 ENCOUNTER — Ambulatory Visit: Admission: RE | Admit: 2019-02-23 | Payer: Medicare HMO | Source: Ambulatory Visit

## 2019-02-23 DIAGNOSIS — C9110 Chronic lymphocytic leukemia of B-cell type not having achieved remission: Secondary | ICD-10-CM | POA: Diagnosis present

## 2019-02-23 DIAGNOSIS — C9111 Chronic lymphocytic leukemia of B-cell type in remission: Secondary | ICD-10-CM | POA: Diagnosis not present

## 2019-02-23 DIAGNOSIS — Z5111 Encounter for antineoplastic chemotherapy: Secondary | ICD-10-CM | POA: Diagnosis not present

## 2019-02-23 IMAGING — CT CT NECK WITH CONTRAST
4 of 5 series · 14 of 33 positions shown, 16 images · IV contrast (omnipaque)
Comparison: CT Chest, Abdomen, and Pelvis today are reported
separately. Neck CT [DATE].

CLINICAL DATA: 72-year-old male restaging CLL.

EXAM:
CT NECK WITH CONTRAST
TECHNIQUE: Multidetector CT imaging of the neck was performed using the
standard protocol following the bolus administration of intravenous
contrast.
CONTRAST:  100mL OMNIPAQUE IOHEXOL 300 MG/ML SOLN in conjunction
with contrast enhanced imaging of the chest, abdomen, and pelvis
reported separately.

[Series 10: axials neck (person_name) · axial · 0.70mm/px · z∈[-766,-682]mm · 2 of 128 slices shown]
[im 43/128  bone]
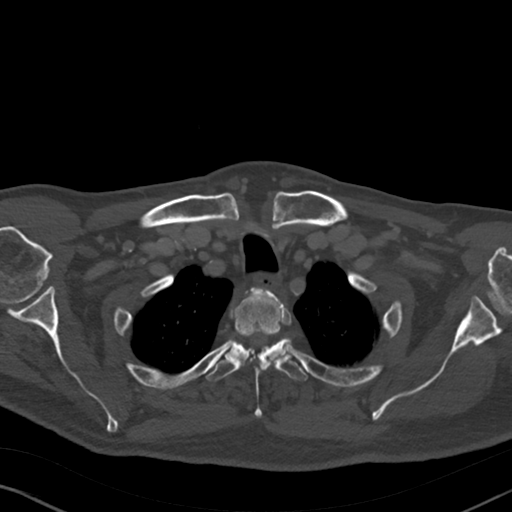
[im 85/128  bone]
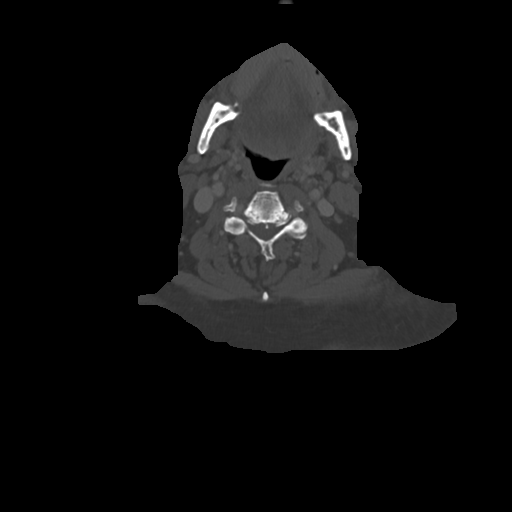

[Series 12: coronal neck (person_name) · coronal · 0.66mm/px · 3 of 157 slices shown]
[im 49/157  bone]
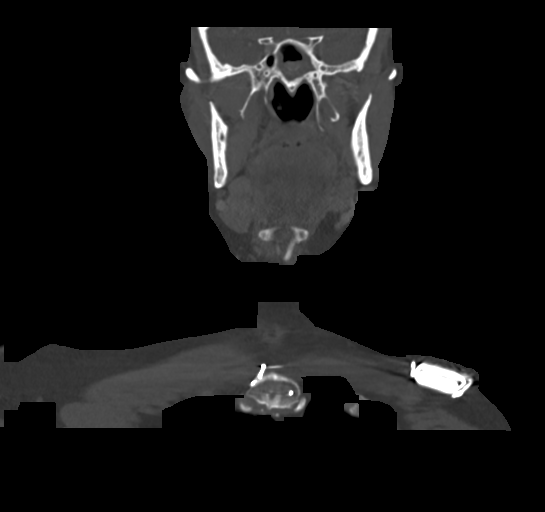
[im 69/157  bone]
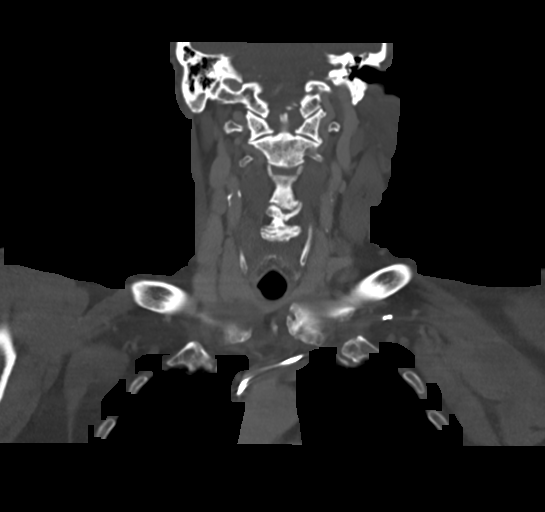
[im 89/157  bone]
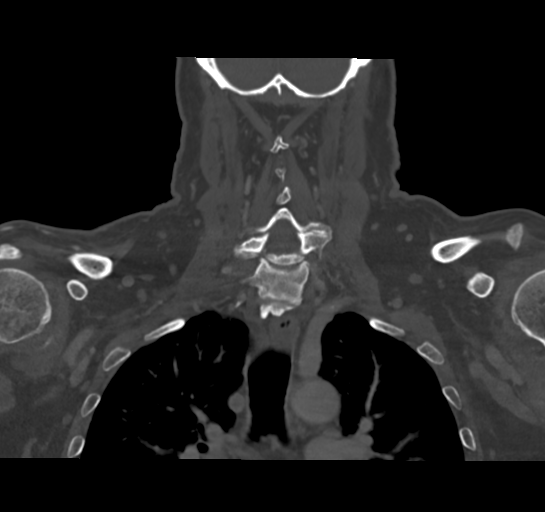

[Series 14: sagittal neck (person_name) · sagittal · 0.61mm/px · 5 of 180 slices shown, 6 images]
[im 60/180  bone]
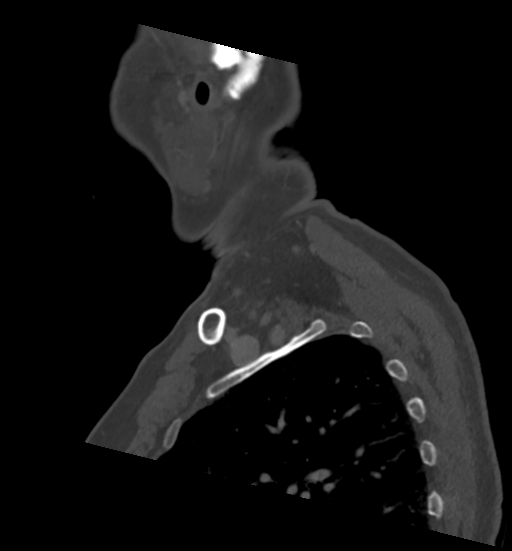
[im 75/180  bone]
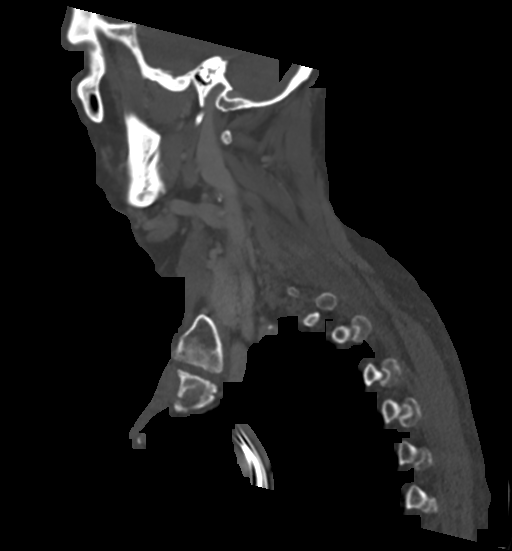
[im 90/180  soft-tissue]
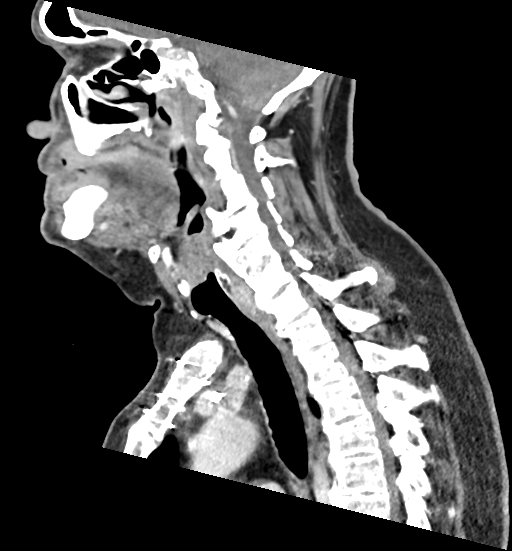
[im 90/180  bone]
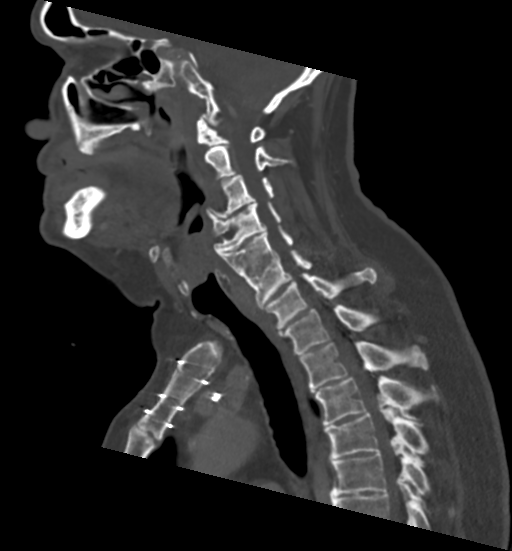
[im 105/180  bone]
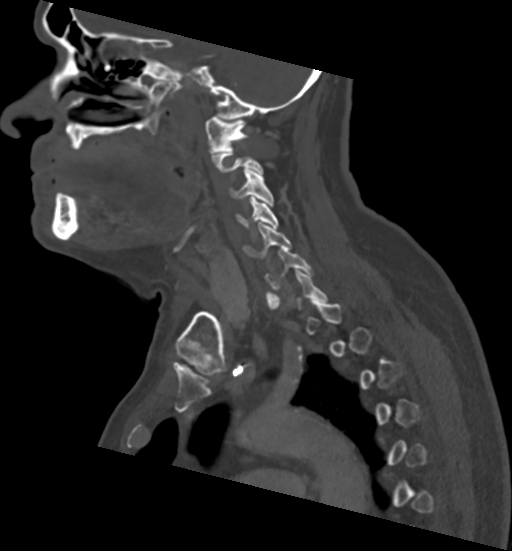
[im 120/180  bone]
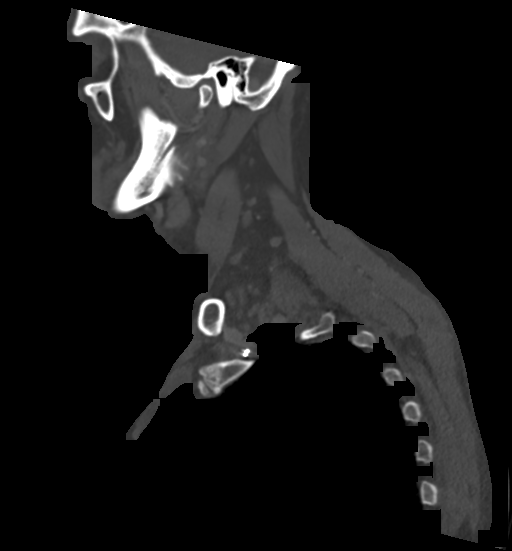

[Series 16: ax oropharynx neck (person_name) · axial · 0.61mm/px · z∈[-864,-668]mm · 4 of 169 slices shown, 5 images]
[im 34/169  soft-tissue]
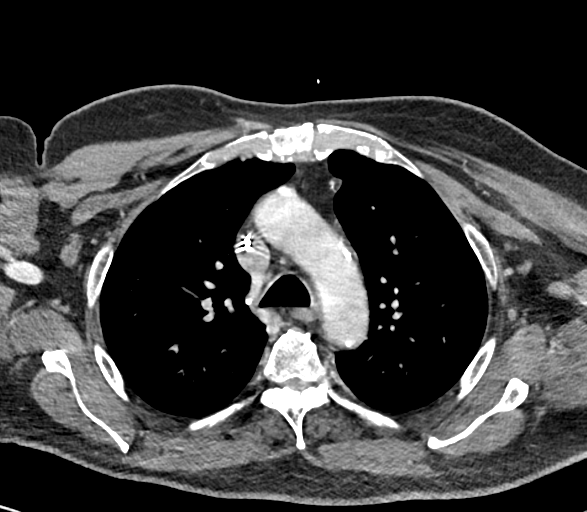
[im 34/169  bone]
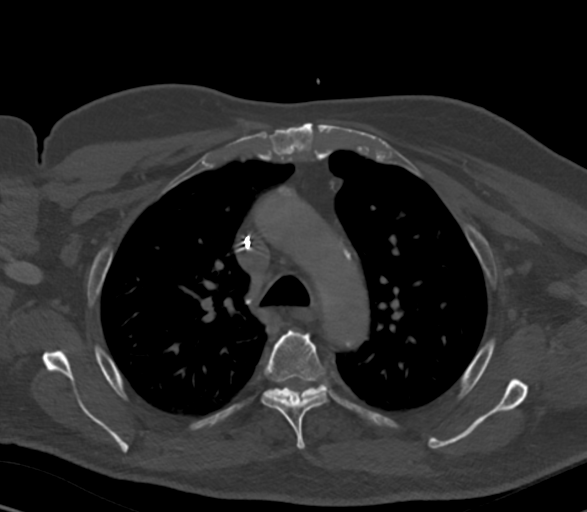
[im 68/169  bone]
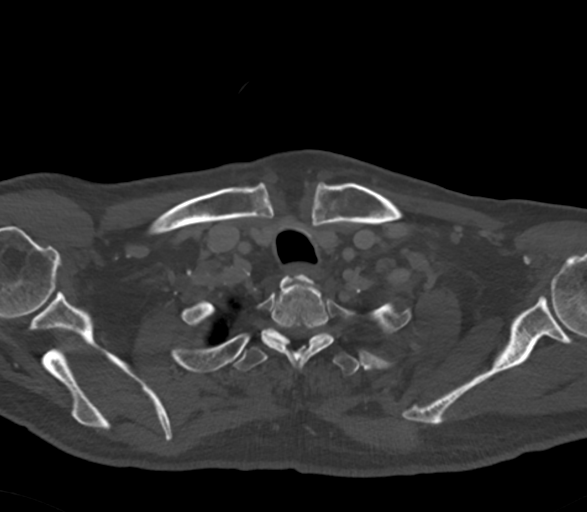
[im 101/169  bone]
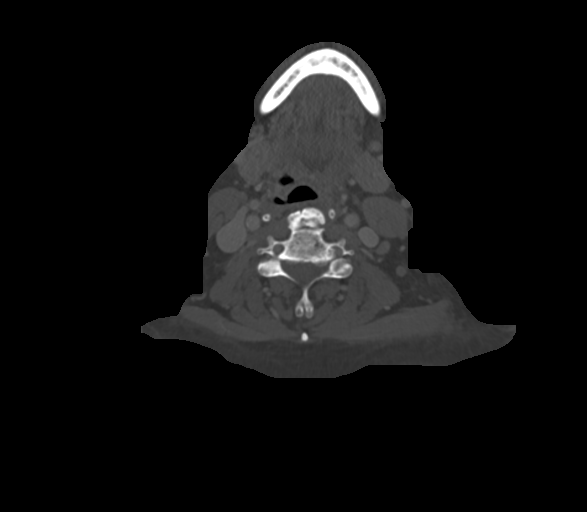
[im 135/169  bone]
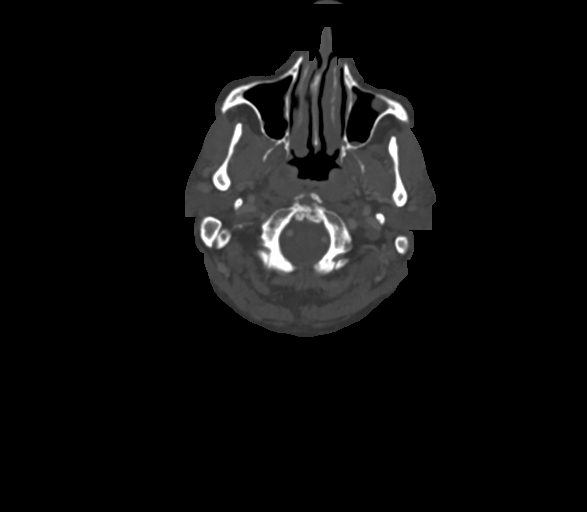

[14 of 33 positions shown; findings below may reference images not displayed]

FINDINGS: Pharynx and larynx: The glottis is closed. Laryngeal and pharyngeal
soft tissue contours remain within normal limits. Negative
parapharyngeal and retropharyngeal spaces.

Salivary glands: Negative sublingual space. Submandibular and
parotid glands are symmetric and within normal limits.

Thyroid: Negative.

Lymph nodes: Stable to mildly further decreased size of thoracic
inlet and cervical lymph nodes since [REDACTED]. Stable left subclavian
node on series 10, image 80 at 8 millimeters short axis. Decreased
left level 2B or 3B node on series 10, image 54 from 8 to now 7
millimeters short axis. No increasing or heterogeneous lymph nodes.

Vascular: Left subclavian approach pacemaker type leads
redemonstrated. The major vascular structures in the neck and at the
skull base remain patent. Stable cervical carotid calcified
atherosclerosis. Calcified atherosclerosis at the skull base.

Limited intracranial: Negative.

Visualized orbits: Negative.

Mastoids and visualized paranasal sinuses: Stable scattered
paranasal sinus mucosal thickening and/or retention cysts. Tympanic
cavities and mastoids remain clear.

Skeleton: Stable visualized osseous structures. Diffuse idiopathic
skeletal hyperostosis (DISH) with bulky cervical endplate spurring
and subsequent interbody ankylosis C4-C5 and C5-C6. Absent
dentition.

Upper chest: Reported separately.
IMPRESSION: 1. Stable to further decreased cervical lymph nodes since [DATE]. No evidence of active CLL in the neck.
2.  CT Chest, Abdomen, and Pelvis today are reported separately.

## 2019-02-23 IMAGING — CT CT CHEST WITH CONTRAST
1 series · 1 of 1 positions shown · IV contrast (omnipaque)
Comparison: [DATE].

CLINICAL DATA: CLL, on chemotherapy.

EXAM:
CT CHEST, ABDOMEN, AND PELVIS WITH CONTRAST
TECHNIQUE: Multidetector CT imaging of the chest, abdomen and pelvis was
performed following the standard protocol during bolus
administration of intravenous contrast.
CONTRAST:  100mL OMNIPAQUE IOHEXOL 300 MG/ML  SOLN

[Series 2: topogram · coronal · 2.00mm/px · 1 of 1 slices shown]
[im 1/1]
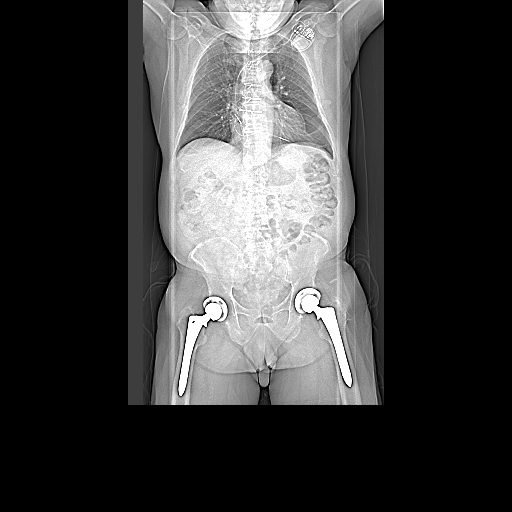

[1 of 1 positions shown; findings below may reference images not displayed]

FINDINGS: CT CHEST FINDINGS

Cardiovascular: Atherosclerotic calcification of the aorta. Heart
size normal although the left ventricle appears slightly dilated. No
pericardial effusion.

Mediastinum/Nodes: Mediastinal lymph nodes measure up to 8 mm in the
AP window, previously 1.3 cm. Minimal bihilar lymphoid tissue
without adenopathy. Axillary lymph nodes have decreased in size as
well. Index left axillary lymph node measures 1.2 cm, previously
cm. Esophagus is grossly unremarkable.

Lungs/Pleura: Mild volume loss in the medial aspects of both lower
lobes and lingula. Lungs are otherwise clear. No pleural fluid.
Airway is unremarkable.

Musculoskeletal: Degenerative changes in the spine. No worrisome
lytic or sclerotic lesions.

CT ABDOMEN PELVIS FINDINGS

Hepatobiliary: Liver is unremarkable. Cholecystectomy. No biliary
ductal dilatation.

Pancreas: Negative.

Spleen: Negative.  Normal in size.

Adrenals/Urinary Tract: Adrenal glands are unremarkable. 3.4 cm
fluid density lesion in the right renal sinus is likely a cyst. Left
kidney is unremarkable. Ureters are decompressed. Bladder is grossly
unremarkable.

Stomach/Bowel: Stomach, small bowel and colon are unremarkable.
Appendix is not readily visualized.

Vascular/Lymphatic: Atherosclerotic calcification of the aorta
without aneurysm. Periportal lymph nodes measure up to 1.5 cm
(series 3, image 63), previously 2.9 cm. Abdominal retroperitoneal
lymph nodes measure up to 2.0 cm in the left periaortic station
(74), previously 5.0 cm. Pelvic retroperitoneal lymph nodes show
marked decrease in size, with an index left external iliac lymph
node measuring 7 mm (102), previously 2.5 cm. Inguinal lymph nodes
have decreased in size as well. Index left inguinal lymph node
measures 11 mm (120), previously 14 mm.

Reproductive: Prostate is enlarged.

Other: No free fluid.  Mesenteries and peritoneum are unremarkable.

Musculoskeletal: No worrisome lytic or sclerotic lesions.
Degenerative changes in the spine. Bilateral hip arthroplasties.
IMPRESSION: 1. Continued regression of adenopathy in the chest, abdomen and
pelvis. CT neck dictated separately.
2. Enlarged prostate.
3.  Aortic atherosclerosis ([VO]-170.0).

## 2019-02-23 IMAGING — CT CT NECK WITH CONTRAST
1 series · 1 of 1 positions shown · IV contrast (omnipaque)
Comparison: CT Chest, Abdomen, and Pelvis today are reported
separately. Neck CT [DATE].

CLINICAL DATA: 72-year-old male restaging CLL.

EXAM:
CT NECK WITH CONTRAST
TECHNIQUE: Multidetector CT imaging of the neck was performed using the
standard protocol following the bolus administration of intravenous
contrast.
CONTRAST:  100mL OMNIPAQUE IOHEXOL 300 MG/ML SOLN in conjunction
with contrast enhanced imaging of the chest, abdomen, and pelvis
reported separately.

[Series 1: topogram · sagittal · 1.00mm/px · 1 of 1 slices shown]
[im 1/1]
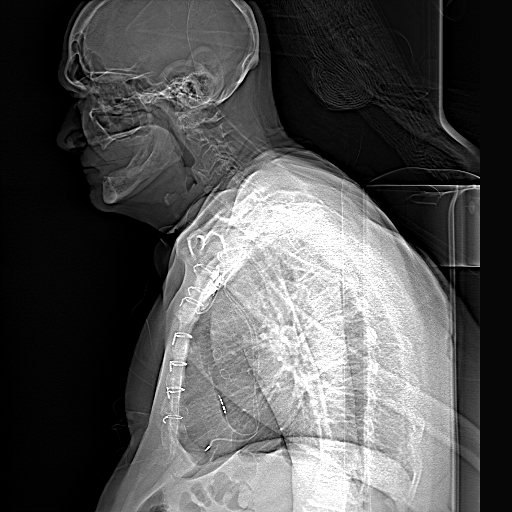

[1 of 1 positions shown; findings below may reference images not displayed]

FINDINGS: Pharynx and larynx: The glottis is closed. Laryngeal and pharyngeal
soft tissue contours remain within normal limits. Negative
parapharyngeal and retropharyngeal spaces.

Salivary glands: Negative sublingual space. Submandibular and
parotid glands are symmetric and within normal limits.

Thyroid: Negative.

Lymph nodes: Stable to mildly further decreased size of thoracic
inlet and cervical lymph nodes since [REDACTED]. Stable left subclavian
node on series 10, image 80 at 8 millimeters short axis. Decreased
left level 2B or 3B node on series 10, image 54 from 8 to now 7
millimeters short axis. No increasing or heterogeneous lymph nodes.

Vascular: Left subclavian approach pacemaker type leads
redemonstrated. The major vascular structures in the neck and at the
skull base remain patent. Stable cervical carotid calcified
atherosclerosis. Calcified atherosclerosis at the skull base.

Limited intracranial: Negative.

Visualized orbits: Negative.

Mastoids and visualized paranasal sinuses: Stable scattered
paranasal sinus mucosal thickening and/or retention cysts. Tympanic
cavities and mastoids remain clear.

Skeleton: Stable visualized osseous structures. Diffuse idiopathic
skeletal hyperostosis (DISH) with bulky cervical endplate spurring
and subsequent interbody ankylosis C4-C5 and C5-C6. Absent
dentition.

Upper chest: Reported separately.
IMPRESSION: 1. Stable to further decreased cervical lymph nodes since [DATE]. No evidence of active CLL in the neck.
2.  CT Chest, Abdomen, and Pelvis today are reported separately.

## 2019-02-23 IMAGING — CT CT ABDOMEN AND PELVIS WITH CONTRAST
3 of 10 series · 11 of 46 positions shown, 17 images · IV contrast (omnipaque)
Comparison: [DATE].

CLINICAL DATA: CLL, on chemotherapy.

EXAM:
CT CHEST, ABDOMEN, AND PELVIS WITH CONTRAST
TECHNIQUE: Multidetector CT imaging of the chest, abdomen and pelvis was
performed following the standard protocol during bolus
administration of intravenous contrast.
CONTRAST:  100mL OMNIPAQUE IOHEXOL 300 MG/ML  SOLN

[Series 3: axials cap (person_name) · axial · 0.79mm/px · z∈[-1273,-863]mm · 4 of 138 slices shown, 9 images]
[im 28/138  soft-tissue]
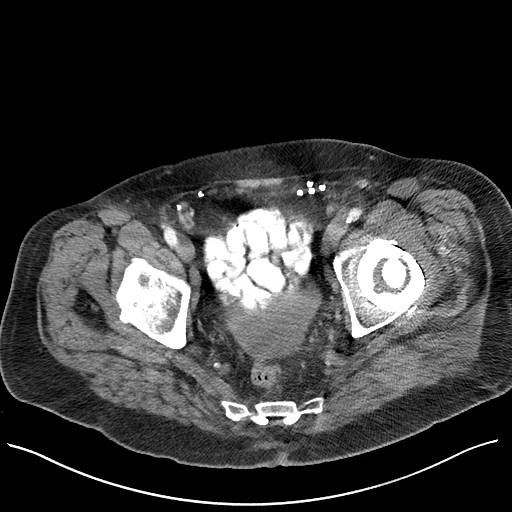
[im 28/138  lung]
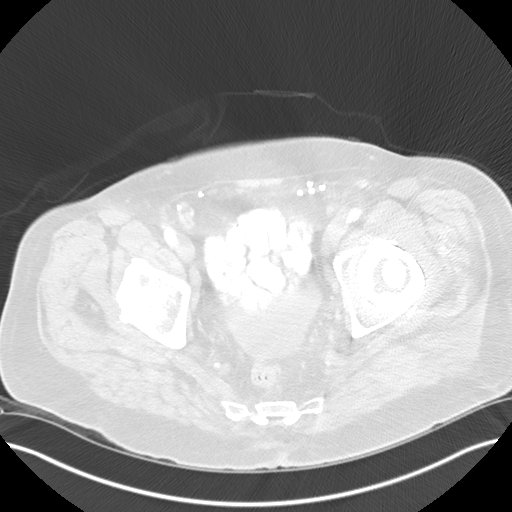
[im 28/138  bone]
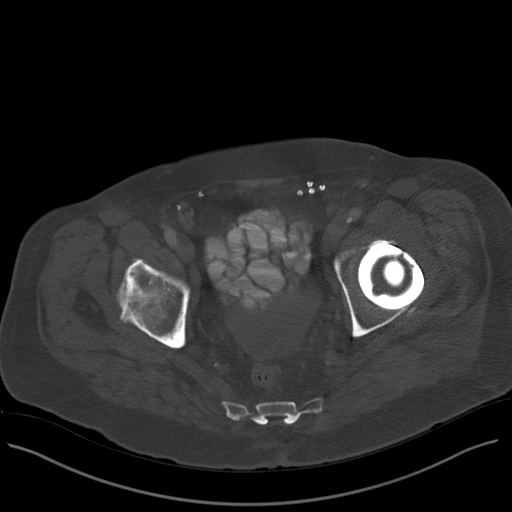
[im 55/138  soft-tissue]
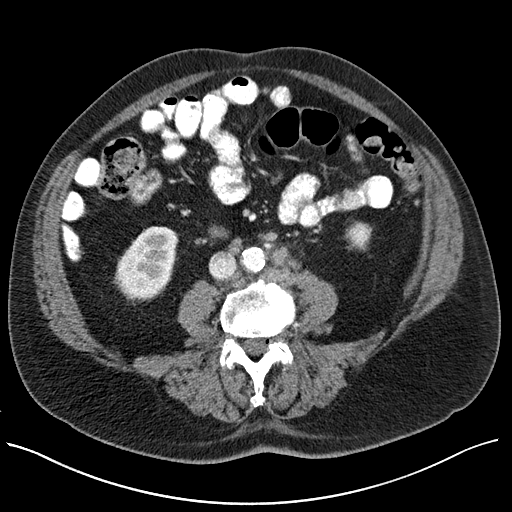
[im 55/138  lung]
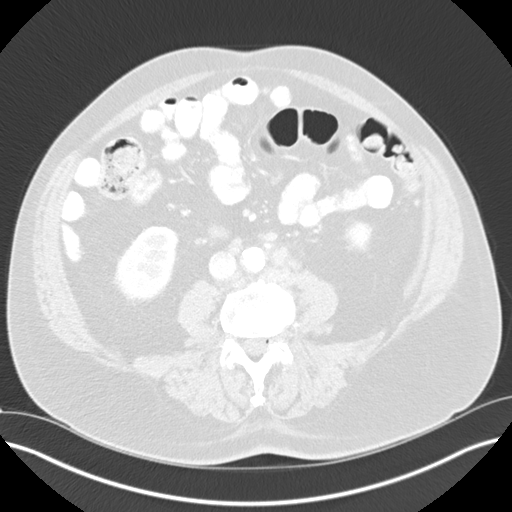
[im 83/138  soft-tissue]
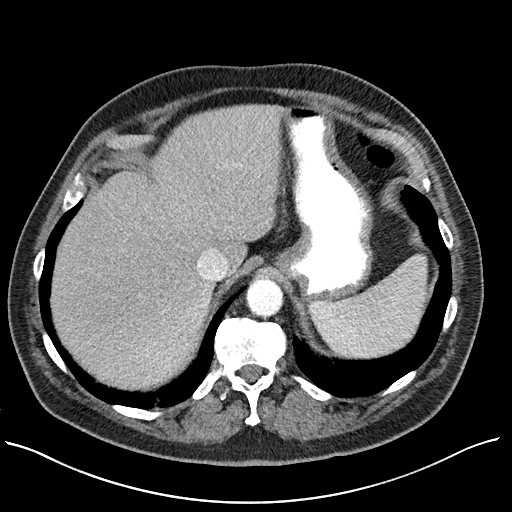
[im 83/138  lung]
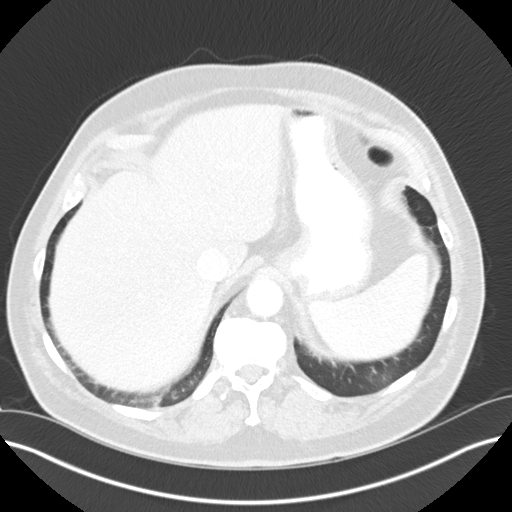
[im 110/138  soft-tissue]
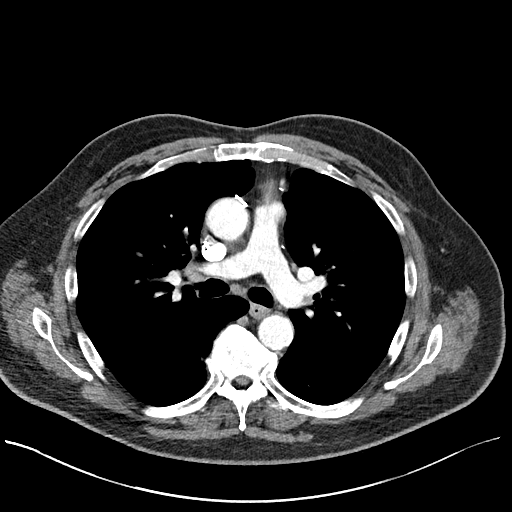
[im 110/138  lung]
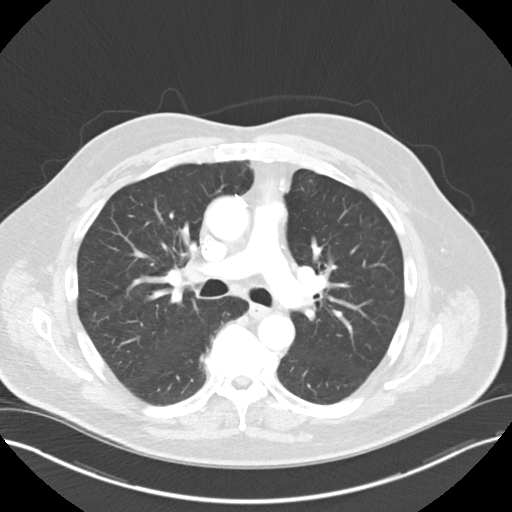

[Series 12: coronal neck (person_name) · coronal · 0.66mm/px · 3 of 157 slices shown, 4 images]
[im 40/157  soft-tissue]
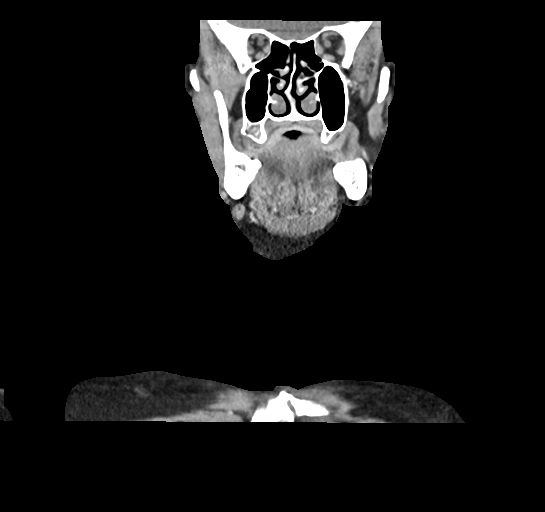
[im 79/157  soft-tissue]
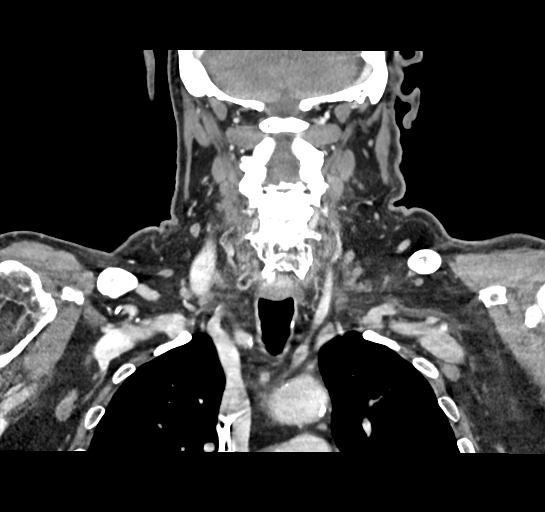
[im 79/157  bone]
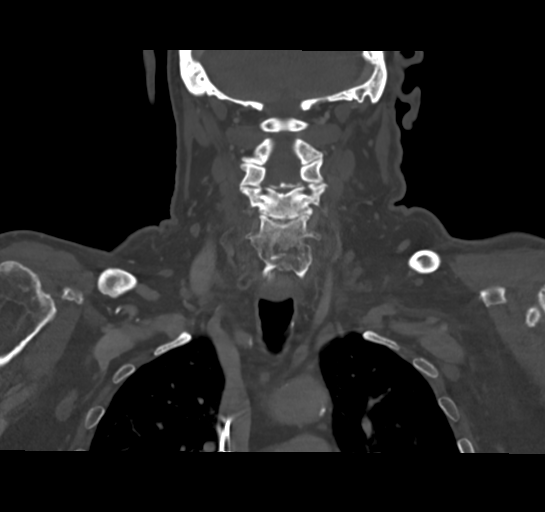
[im 118/157  soft-tissue]
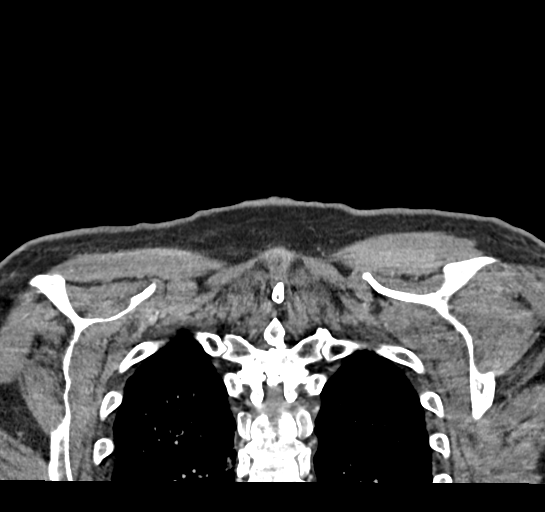

[Series 16: ax oropharynx neck (person_name) · axial · 0.61mm/px · z∈[-874,-711]mm · 4 of 169 slices shown]
[im 29/169  soft-tissue]
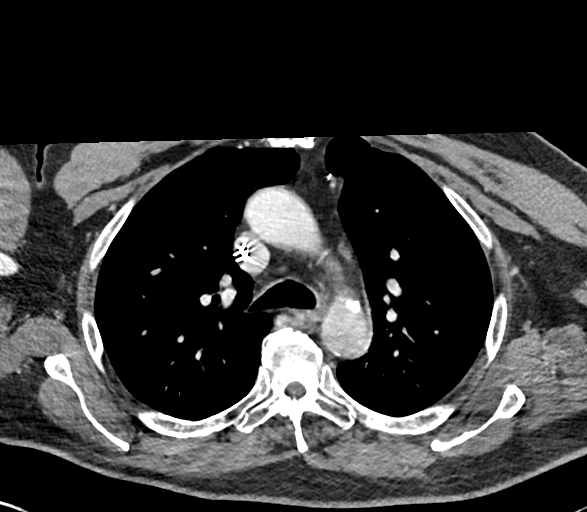
[im 57/169  soft-tissue]
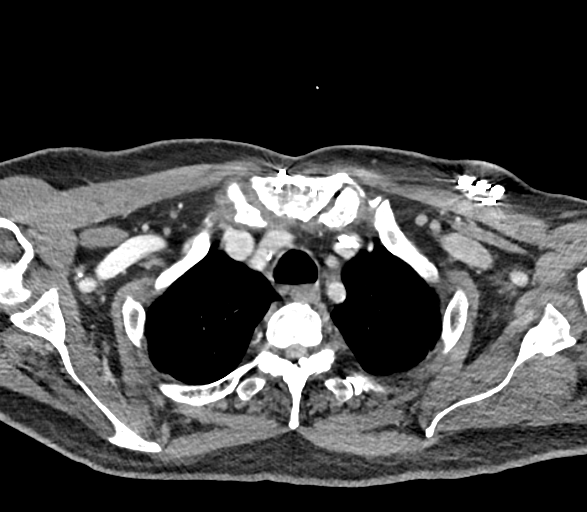
[im 85/169  soft-tissue]
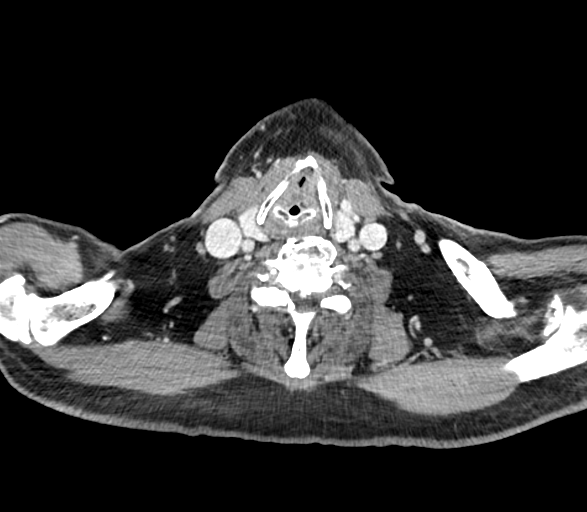
[im 113/169  soft-tissue]
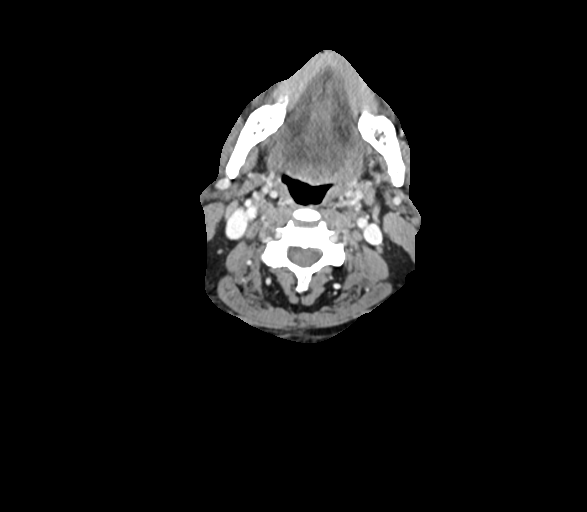

[11 of 46 positions shown; findings below may reference images not displayed]

FINDINGS: CT CHEST FINDINGS

Cardiovascular: Atherosclerotic calcification of the aorta. Heart
size normal although the left ventricle appears slightly dilated. No
pericardial effusion.

Mediastinum/Nodes: Mediastinal lymph nodes measure up to 8 mm in the
AP window, previously 1.3 cm. Minimal bihilar lymphoid tissue
without adenopathy. Axillary lymph nodes have decreased in size as
well. Index left axillary lymph node measures 1.2 cm, previously
cm. Esophagus is grossly unremarkable.

Lungs/Pleura: Mild volume loss in the medial aspects of both lower
lobes and lingula. Lungs are otherwise clear. No pleural fluid.
Airway is unremarkable.

Musculoskeletal: Degenerative changes in the spine. No worrisome
lytic or sclerotic lesions.

CT ABDOMEN PELVIS FINDINGS

Hepatobiliary: Liver is unremarkable. Cholecystectomy. No biliary
ductal dilatation.

Pancreas: Negative.

Spleen: Negative.  Normal in size.

Adrenals/Urinary Tract: Adrenal glands are unremarkable. 3.4 cm
fluid density lesion in the right renal sinus is likely a cyst. Left
kidney is unremarkable. Ureters are decompressed. Bladder is grossly
unremarkable.

Stomach/Bowel: Stomach, small bowel and colon are unremarkable.
Appendix is not readily visualized.

Vascular/Lymphatic: Atherosclerotic calcification of the aorta
without aneurysm. Periportal lymph nodes measure up to 1.5 cm
(series 3, image 63), previously 2.9 cm. Abdominal retroperitoneal
lymph nodes measure up to 2.0 cm in the left periaortic station
(74), previously 5.0 cm. Pelvic retroperitoneal lymph nodes show
marked decrease in size, with an index left external iliac lymph
node measuring 7 mm (102), previously 2.5 cm. Inguinal lymph nodes
have decreased in size as well. Index left inguinal lymph node
measures 11 mm (120), previously 14 mm.

Reproductive: Prostate is enlarged.

Other: No free fluid.  Mesenteries and peritoneum are unremarkable.

Musculoskeletal: No worrisome lytic or sclerotic lesions.
Degenerative changes in the spine. Bilateral hip arthroplasties.
IMPRESSION: 1. Continued regression of adenopathy in the chest, abdomen and
pelvis. CT neck dictated separately.
2. Enlarged prostate.
3.  Aortic atherosclerosis ([VO]-170.0).

## 2019-02-23 MED ORDER — IOHEXOL 300 MG/ML  SOLN
100.0000 mL | Freq: Once | INTRAMUSCULAR | Status: AC | PRN
  Administered 2019-02-23: 100 mL via INTRAVENOUS

## 2019-02-28 ENCOUNTER — Inpatient Hospital Stay: Payer: Medicare HMO | Attending: Internal Medicine

## 2019-02-28 ENCOUNTER — Other Ambulatory Visit: Payer: Self-pay

## 2019-02-28 ENCOUNTER — Inpatient Hospital Stay: Payer: Medicare HMO

## 2019-02-28 ENCOUNTER — Inpatient Hospital Stay (HOSPITAL_BASED_OUTPATIENT_CLINIC_OR_DEPARTMENT_OTHER): Payer: Medicare HMO | Admitting: Internal Medicine

## 2019-02-28 VITALS — BP 129/74 | HR 67 | Temp 97.4°F | Resp 20 | Ht 69.0 in | Wt 223.0 lb

## 2019-02-28 VITALS — BP 154/77 | HR 73 | Temp 97.0°F | Resp 18

## 2019-02-28 DIAGNOSIS — C9110 Chronic lymphocytic leukemia of B-cell type not having achieved remission: Secondary | ICD-10-CM

## 2019-02-28 DIAGNOSIS — Z7189 Other specified counseling: Secondary | ICD-10-CM

## 2019-02-28 DIAGNOSIS — R2681 Unsteadiness on feet: Secondary | ICD-10-CM

## 2019-02-28 DIAGNOSIS — R29898 Other symptoms and signs involving the musculoskeletal system: Secondary | ICD-10-CM | POA: Diagnosis not present

## 2019-02-28 DIAGNOSIS — Z5112 Encounter for antineoplastic immunotherapy: Secondary | ICD-10-CM | POA: Diagnosis not present

## 2019-02-28 LAB — COMPREHENSIVE METABOLIC PANEL
ALT: 36 U/L (ref 0–44)
AST: 23 U/L (ref 15–41)
Albumin: 3.4 g/dL — ABNORMAL LOW (ref 3.5–5.0)
Alkaline Phosphatase: 126 U/L (ref 38–126)
Anion gap: 7 (ref 5–15)
BUN: 25 mg/dL — ABNORMAL HIGH (ref 8–23)
CO2: 25 mmol/L (ref 22–32)
Calcium: 8.7 mg/dL — ABNORMAL LOW (ref 8.9–10.3)
Chloride: 108 mmol/L (ref 98–111)
Creatinine, Ser: 0.89 mg/dL (ref 0.61–1.24)
GFR calc Af Amer: >60 mL/min (ref >60)
GFR calc non Af Amer: >60 mL/min (ref >60)
Glucose, Bld: 205 mg/dL — ABNORMAL HIGH (ref 70–99)
Potassium: 4.1 mmol/L (ref 3.5–5.1)
Sodium: 140 mmol/L (ref 135–145)
Total Bilirubin: 0.7 mg/dL (ref 0.3–1.2)
Total Protein: 6.3 g/dL — ABNORMAL LOW (ref 6.5–8.1)

## 2019-02-28 LAB — CBC WITH DIFFERENTIAL/PLATELET
Abs Immature Granulocytes: 0.14 10*3/uL — ABNORMAL HIGH (ref 0.00–0.07)
Basophils Absolute: 0 10*3/uL (ref 0.0–0.1)
Basophils Relative: 0 %
Eosinophils Absolute: 0 10*3/uL (ref 0.0–0.5)
Eosinophils Relative: 0 %
HCT: 32.5 % — ABNORMAL LOW (ref 39.0–52.0)
Hemoglobin: 10.7 g/dL — ABNORMAL LOW (ref 13.0–17.0)
Immature Granulocytes: 1 %
Lymphocytes Relative: 20 %
Lymphs Abs: 2 10*3/uL (ref 0.7–4.0)
MCH: 32.7 pg (ref 26.0–34.0)
MCHC: 32.9 g/dL (ref 30.0–36.0)
MCV: 99.4 fL (ref 80.0–100.0)
Monocytes Absolute: 0.5 10*3/uL (ref 0.1–1.0)
Monocytes Relative: 5 %
Neutro Abs: 7.4 10*3/uL (ref 1.7–7.7)
Neutrophils Relative %: 74 %
Platelets: 297 10*3/uL (ref 150–400)
RBC: 3.27 MIL/uL — ABNORMAL LOW (ref 4.22–5.81)
RDW: 15.5 % (ref 11.5–15.5)
WBC: 10 10*3/uL (ref 4.0–10.5)
nRBC: 0 % (ref 0.0–0.2)

## 2019-02-28 LAB — LACTATE DEHYDROGENASE: LDH: 151 U/L (ref 98–192)

## 2019-02-28 MED ORDER — DIPHENHYDRAMINE HCL 50 MG/ML IJ SOLN
50.0000 mg | Freq: Once | INTRAMUSCULAR | Status: AC
  Administered 2019-02-28: 50 mg via INTRAVENOUS
  Filled 2019-02-28: qty 1

## 2019-02-28 MED ORDER — ACETAMINOPHEN 325 MG PO TABS
650.0000 mg | ORAL_TABLET | Freq: Once | ORAL | Status: AC
  Administered 2019-02-28: 650 mg via ORAL
  Filled 2019-02-28: qty 2

## 2019-02-28 MED ORDER — SODIUM CHLORIDE 0.9 % IV SOLN
20.0000 mg | Freq: Once | INTRAVENOUS | Status: AC
  Administered 2019-02-28: 20 mg via INTRAVENOUS
  Filled 2019-02-28: qty 2

## 2019-02-28 MED ORDER — SODIUM CHLORIDE 0.9 % IV SOLN
1000.0000 mg | Freq: Once | INTRAVENOUS | Status: AC
  Administered 2019-02-28: 1000 mg via INTRAVENOUS
  Filled 2019-02-28: qty 40

## 2019-02-28 MED ORDER — SODIUM CHLORIDE 0.9 % IV SOLN
Freq: Once | INTRAVENOUS | Status: AC
  Administered 2019-02-28: 10:00:00 via INTRAVENOUS
  Filled 2019-02-28: qty 250

## 2019-02-28 NOTE — Assessment & Plan Note (Addendum)
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   # Proceed with Gazyva-cycle #3; D-1 today; today white count is 10 hemoglobin 10 platelets-292.  Will start patient on venetoclax in 2 weeks; will discuss with cardiology re: DI with ven and amiodarone.  Discussed regarding risk of tumor lysis/IV fluids close monitoring.  Will plan outpatient treatment.  # Spontaneous bruising-on Eliquis.stable.    # A.fib-on amiodarone sinus rhythm-on Eliquis-Stable; will discuss with cards re: DI with ven & amiodarone.   # Debility/gait instability- recommend physical therapy.   # TLS/infectious Prophylaxis-allopurinol; Bactrim/acyclovir.  DISPOSITION: # Gazyva today # in 2 weeks/ MD- labs- cbc/cmp/ldh/mag phos/uric acid; IV Fluids over 2 hours; D-2-cbc/cmp/ldh/mag phos/uric acid- Dr.B  Cc; Alysson- re: venatoclax.

## 2019-02-28 NOTE — Progress Notes (Signed)
Pt tolerated infusion well. Pt and VS stable at discharge.  

## 2019-02-28 NOTE — Progress Notes (Signed)
[facility] OFFICE PROGRESS NOTE  Patient Care Team: Leone Haven, MD as PCP - General (Family Medicine) [name], MD as Consulting Physician (Cardiology) Cammie Sickle, MD as Medical Oncologist (Hematology and Oncology)  Cancer Staging No matching staging information was found for the patient.   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  MARCH 2017- CT N/C/A/P- NED. STOP Ritux; surveillance   # AUG 2019- CT/PET- progression/NO transformation;  # NOV 2019- Progression; started Ibrutinib 420 mg/d. STOPPED in end of feb sec to extreme fatigue/joint pains/cramps  #November 01, 2018-start ibrutinib 280 mg a day; December 20, 2018-discontinue ibrutinib secondary multiple side effects.   #January 03, 2019 start [place][July]    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; IGVH- UN-MUTATED [poor prognosis]  DIAGNOSIS: CLL  STAGE: IV  ;GOALS: control  CURRENT/MOST RECENT THERAPY : Gazyva + venetoclax [pending]    CLL (chronic lymphocytic leukemia) ([facility]  01/03/2019 -  Chemotherapy   The patient had obinutuzumab (GAZYVA) 100 mg in sodium chloride 0.9 % 100 mL (0.9615 mg/mL) chemo infusion, 100 mg, Intravenous, Once, 3 of 6 cycles Administration: 100 mg (01/03/2019), 900 mg (01/04/2019), 1,000 mg (01/10/2019), 1,000 mg (01/31/2019), 1,000 mg (01/17/2019)  for chemotherapy treatment.      INTERVAL HISTORY:  Michael [id number].  male CLL-high risk currently on GDyann Kiefis here for follow-up; currently status post 2 cycles/is here today with results of his CT scan.  Patient's appetite is good.  No weight loss.  Continues to have joint pains/gait  instability-falls.   Complains of mild joint pains/muscle pain.  No new lumps or bumps.  States his blood pressure is doing well.  No syncopal episodes.  Review of Systems  Constitutional: Positive for malaise/fatigue. Negative for chills, diaphoresis and fever.  HENT: Negative for nosebleeds and sore throat.   Eyes: Negative for double vision.  Respiratory: Negative for cough, hemoptysis, sputum production, shortness of breath and wheezing.   Cardiovascular: Negative for chest pain, palpitations, orthopnea and leg swelling.  Gastrointestinal: Negative for abdominal pain, blood in stool, constipation, diarrhea, heartburn, melena, nausea and vomiting.  Genitourinary: Negative for dysuria, frequency and urgency.  Musculoskeletal: Positive for back pain and joint pain.  Skin: Negative.  Negative for itching and rash.  Neurological: Negative for dizziness, tingling, focal weakness and headaches.  Psychiatric/Behavioral: Negative for depression. The patient is not nervous/anxious and does not have insomnia.       PAST MEDICAL HISTORY :  Past Medical History:  Diagnosis Date  . Anxiety   . Arthritis   . Atrial fibrillation ([facility]    a. Dx 2013, recurred 02/2014, CHA2DS2VASc = 3 -->placed on Eliquis;  b. 02/2014 Echo: EF 50-55%, mid and apical anterior septum and mid and apical inf septum are abnl, mild to mod Ao sclerosis w/o AS.  .Marland KitchenChicken pox   . Chronic lymphocytic leukemia ([facility]    a. Dx 02/2014.  .Marland KitchenComplication of anesthesia    History of  PTSD--do not touch patient when waking up from surgery.  .Marland KitchenCOPD (chronic obstructive pulmonary disease) ([facility]   . Coronary artery disease    a. 04/2009 CABG x 3 (LIMA->LAD, VG->OM1,  VG->PDA);  b. 09/2009 Cath: occluded VG x 2 w/ patent LIMA and L->R collats. EF 55%, mild antlat HK;  c. 10/2011 MV: EF 53%, no isch/infarct-->low risk.  Marland Kitchen Dysrhythmia    hx of a-fib  . GERD (gastroesophageal reflux disease)    occasional  . History of chemotherapy  2015-2016  . HOH (hard of hearing)    Bilateral Hearing Aids  . Hypertension   . Myocardial infarction ([place]) 2010  . OSA on CPAP    USE C-PAP  . Presence of permanent cardiac pacemaker 2017  . PTSD (post-traumatic stress disorder)   . PTSD (post-traumatic stress disorder)   . Pure hypercholesterolemia   . Rheumatic fever 1959  . 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 ABDOMINAL HERNIA REPAIR    . APPENDECTOMY  06/21/1985  . CARDIAC CATHETERIZATION  2010; 2011   ; Dr Fletcher Anon  . CORONARY ARTERY BYPASS GRAFT  04/2009   "CABG X3"  . EP IMPLANTABLE DEVICE N/A 03/03/2016   Procedure: Pacemaker Implant;  Surgeon: Deboraha Sprang, MD;  Location: [facility];  Service: Cardiovascular;  Laterality: N/A;  . FOREIGN BODY REMOVAL  1968   "shrapnel in my tailbone"  . INGUINAL HERNIA REPAIR Right   . INSERT / REPLACE / REMOVE PACEMAKER    . JOINT REPLACEMENT Right 2018  . LAPAROSCOPIC CHOLECYSTECTOMY    . TONSILLECTOMY AND ADENOIDECTOMY  1956  . TOTAL HIP ARTHROPLASTY Right 10/22/2016   Procedure: TOTAL HIP ARTHROPLASTY;  Surgeon: Dereck Leep, MD;  Location: [facility];  Service: Orthopedics;  Laterality: Right;  . TOTAL HIP ARTHROPLASTY Left 11/04/2017   Procedure: TOTAL HIP ARTHROPLASTY;  Surgeon: Dereck Leep, MD;  Location: [facility];  Service: Orthopedics;  Laterality: Left;    FAMILY HISTORY :   Family History  Problem Relation Age of Onset  . Heart disease Mother   . Heart attack Mother   . Coronary artery disease Other        family history    SOCIAL HISTORY:   Social History   Tobacco Use  . Smoking status: Former Smoker    Packs/day: 1.00    Years: 40.00    Pack years: 40.00    Types: Cigarettes    Quit date: 07/21/2006    Years since quitting: 12.6  . Smokeless tobacco: Never Used  Substance Use Topics  . Alcohol use: Yes    Alcohol/week: 1.0 standard drinks     Types: 1 Standard drinks or equivalent per week    Comment: rarely  . Drug use: No    ALLERGIES:  has No Known Allergies.  MEDICATIONS:  Current Outpatient Medications  Medication Sig Dispense Refill  . acyclovir (ZOVIRAX) 400 MG tablet One pill a day [to prevent shingles] 30 tablet 3  . albuterol (PROVENTIL HFA;VENTOLIN HFA) 108 (90 Base) MCG/ACT inhaler Inhale 2 puffs into the lungs every 6 (six) hours as needed for wheezing or shortness of breath. 1 Inhaler 11  . allopurinol (ZYLOPRIM) 300 MG tablet Take 1 tablet by mouth twice daily 120 tablet 3  . amiodarone (PACERONE) 200 MG tablet TAKE 1/2 TABLET (100 mg)  BY MOUTH TWICE DAILY 90 tablet 2  . apixaban (ELIQUIS) 5 MG TABS tablet Take 5 mg by mouth 2 (two) times daily.    Marland Kitchen atorvastatin (LIPITOR) 80 MG tablet Take 1 tablet (80 mg total) by mouth at bedtime.  90 tablet 3  . budesonide-formoterol (SYMBICORT) 160-4.5 MCG/ACT inhaler Inhale 2 puffs into the lungs 2 (two) times daily. 1 Inhaler 11  . cetirizine (ZYRTEC) 10 MG tablet Take 10 mg by mouth daily as needed for allergies.     . Coenzyme Q10 (COQ10) 200 MG CAPS Take 200 mg by mouth daily.    Marland Kitchen dexamethasone (DECADRON) 4 MG tablet Start 2 days prior to infusion; Take for 2 days. Do not take on the day of infusion. 60 tablet 3  . ezetimibe (ZETIA) 10 MG tablet Take 1 tablet (10 mg total) by mouth daily. 90 tablet 3  . Krill Oil 350 MG CAPS Take 350 mg by mouth every evening.    . metoprolol tartrate (LOPRESSOR) 25 MG tablet Take 25 mg by mouth 2 (two) times daily.    . mirtazapine (REMERON) 15 MG tablet Take 15 mg by mouth at bedtime as needed (for panic associated with PTSD).     Marland Kitchen montelukast (SINGULAIR) 10 MG tablet Take 1 tablet (10 mg total) by mouth at bedtime. Start 2 days prior to infusion. Take it for 4 days. 60 tablet 0  . Multiple Vitamin (MULTIVITAMIN WITH MINERALS) TABS tablet Take 1 tablet by mouth daily.    . mupirocin ointment (BACTROBAN) 2 % Apply 1 application  topically 3 (three) times daily. 30 g 1  . tiotropium (SPIRIVA) 18 MCG inhalation capsule Place 1 capsule (18 mcg total) into inhaler and inhale at bedtime. 30 capsule 11  . traMADol (ULTRAM) 50 MG tablet TAKE 1 TABLET BY MOUTH EVERY 12 HOURS AS NEEDED FOR MODERATE PAIN 90 tablet 0  . acetaminophen (TYLENOL) 500 MG tablet Take 1,000 mg by mouth every 8 (eight) hours as needed for mild pain.    . nitroGLYCERIN (NITROSTAT) 0.4 MG SL tablet Place 1 tablet (0.4 mg total) under the tongue every 5 (five) minutes as needed for chest pain. (Patient not taking: Reported on 01/10/2019) 25 tablet 6   No current facility-administered medications for this visit.     PHYSICAL EXAMINATION: ECOG PERFORMANCE STATUS: 1 - Symptomatic but completely ambulatory  BP 129/74   Pulse 67   Temp (!) 97.4 F (36.3 C) (Tympanic)   Resp 20   Ht '5\' 9"'$  (1.753 m)   Wt 223 lb (101.2 kg)   BMI 32.93 kg/m   Filed Weights   02/28/19 0824  Weight: 223 lb (101.2 kg)    Physical Exam  Constitutional: He is oriented to person, place, and time and well-developed, well-nourished, and in no distress.  Patient is alone.  HENT:  Head: Normocephalic and atraumatic.  Mouth/Throat: Oropharynx is clear and moist. No oropharyngeal exudate.  Eyes: Pupils are equal, round, and reactive to light.  Neck: Normal range of motion. Neck supple.  Cardiovascular: Normal rate and regular rhythm.  Pulmonary/Chest: No respiratory distress. He has no wheezes.  Abdominal: Soft. Bowel sounds are normal. He exhibits no distension and no mass. There is no abdominal tenderness. There is no rebound and no guarding.  Musculoskeletal: Normal range of motion.        General: No tenderness or edema.  Lymphadenopathy:  Resolved lymph nodes in the neck underarms.  Neurological: He is alert and oriented to person, place, and time.  Skin: Skin is warm.  Psychiatric: Affect normal.       LABORATORY DATA:  I have reviewed the data as listed     Component Value Date/Time   NA 140 02/28/2019 0806   NA 139 10/11/2014 1800  K 4.1 02/28/2019 0806   K 3.3 (L) 10/11/2014 1800   CL 108 02/28/2019 0806   CL 106 10/11/2014 1800   CO2 25 02/28/2019 0806   CO2 27 10/11/2014 1800   GLUCOSE 205 (H) 02/28/2019 0806   GLUCOSE 107 (H) 10/11/2014 1800   BUN 25 (H) 02/28/2019 0806   BUN 15 10/11/2014 1800   CREATININE 0.89 02/28/2019 0806   CREATININE 0.89 10/11/2014 1800   CALCIUM 8.7 (L) 02/28/2019 0806   CALCIUM 8.8 (L) 10/11/2014 1800   PROT 6.3 (L) 02/28/2019 0806   PROT 6.7 05/18/2017 1048   PROT 6.4 (L) 10/11/2014 1800   ALBUMIN 3.4 (L) 02/28/2019 0806   ALBUMIN 4.3 05/18/2017 1048   ALBUMIN 4.1 10/11/2014 1800   AST 23 02/28/2019 0806   AST 23 10/11/2014 1800   ALT 36 02/28/2019 0806   ALT 22 10/11/2014 1800   ALKPHOS 126 02/28/2019 0806   ALKPHOS 61 10/11/2014 1800   BILITOT 0.7 02/28/2019 0806   BILITOT 0.7 05/18/2017 1048   BILITOT 0.9 10/11/2014 1800   GFRNONAA >60 02/28/2019 0806   GFRNONAA >60 10/11/2014 1800   GFRAA >60 02/28/2019 0806   GFRAA >60 10/11/2014 1800    No results found for: SPEP, UPEP  Lab Results  Component Value Date   WBC 10.0 02/28/2019   NEUTROABS 7.4 02/28/2019   HGB 10.7 (L) 02/28/2019   HCT 32.5 (L) 02/28/2019   MCV 99.4 02/28/2019   PLT 297 02/28/2019      Chemistry      Component Value Date/Time   NA 140 02/28/2019 0806   NA 139 10/11/2014 1800   K 4.1 02/28/2019 0806   K 3.3 (L) 10/11/2014 1800   CL 108 02/28/2019 0806   CL 106 10/11/2014 1800   CO2 25 02/28/2019 0806   CO2 27 10/11/2014 1800   BUN 25 (H) 02/28/2019 0806   BUN 15 10/11/2014 1800   CREATININE 0.89 02/28/2019 0806   CREATININE 0.89 10/11/2014 1800      Component Value Date/Time   CALCIUM 8.7 (L) 02/28/2019 0806   CALCIUM 8.8 (L) 10/11/2014 1800   ALKPHOS 126 02/28/2019 0806   ALKPHOS 61 10/11/2014 1800   AST 23 02/28/2019 0806   AST 23 10/11/2014 1800   ALT 36 02/28/2019 0806   ALT 22 10/11/2014  1800   BILITOT 0.7 02/28/2019 0806   BILITOT 0.7 05/18/2017 1048   BILITOT 0.9 10/11/2014 1800       RADIOGRAPHIC STUDIES: I have personally reviewed the radiological images as listed and agreed with the findings in the report. No results found.   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   # Proceed with Gazyva-cycle #3; D-1 today; today white count is 10 hemoglobin 10 platelets-292.  Will start patient on venetoclax in 2 weeks; will discuss with cardiology re: DI with ven and amiodarone.  Discussed regarding risk of tumor lysis/IV fluids close monitoring.  Will plan outpatient treatment.  # Spontaneous bruising-on Eliquis.stable.    # A.fib-on amiodarone sinus rhythm-on Eliquis-Stable; will discuss with cards re: DI with ven & amiodarone.   # Debility/gait instability- recommend physical therapy.   # TLS/infectious Prophylaxis-allopurinol; Bactrim/acyclovir.  DISPOSITION: # Gazyva today # in 2 weeks/ MD- labs- cbc/cmp/ldh/mag phos/uric acid; IV Fluids over 2 hours; D-2-cbc/cmp/ldh/mag phos/uric acid- Dr.B  Cc; Alysson- re: venatoclax.     Orders Placed This Encounter  Procedures  . CBC  with Differential    Standing Status:   Future    Standing Expiration Date:   02/28/2020  . Comprehensive metabolic panel    Standing Status:   Future    Standing Expiration Date:   02/28/2020  . Lactate dehydrogenase    Standing Status:   Future    Standing Expiration Date:   02/28/2020  . Magnesium    Standing Status:   Future    Standing Expiration Date:   02/28/2020  . Uric acid    Standing Status:   Future    Standing Expiration Date:   02/28/2020  . CBC with Differential    Standing Status:   Future    Standing Expiration Date:   02/28/2020  . Comprehensive metabolic panel    Standing Status:   Future    Standing Expiration Date:   02/28/2020   . Magnesium    Standing Status:   Future    Standing Expiration Date:   02/28/2020  . Lactate dehydrogenase    Standing Status:   Future    Standing Expiration Date:   02/28/2020  . Uric acid    Standing Status:   Future    Standing Expiration Date:   02/28/2020  . Phosphorus    Standing Status:   Future    Standing Expiration Date:   02/28/2020  . Phosphorus    Standing Status:   Future    Standing Expiration Date:   02/28/2020  . Ambulatory referral to Physical Therapy    Referral Priority:   Routine    Referral Type:   Physical Medicine    Referral Reason:   Specialty Services Required    Requested Specialty:   Physical Therapy    Number of Visits Requested:   1   All questions were answered. The patient knows to call the clinic with any problems, questions or concerns.      Cammie Sickle, MD 02/28/2019 12:54 PM

## 2019-03-03 ENCOUNTER — Telehealth: Payer: Self-pay | Admitting: Pharmacist

## 2019-03-03 DIAGNOSIS — C9110 Chronic lymphocytic leukemia of B-cell type not having achieved remission: Secondary | ICD-10-CM

## 2019-03-04 ENCOUNTER — Telehealth: Payer: Self-pay | Admitting: Pharmacy Technician

## 2019-03-04 MED ORDER — VENCLEXTA STARTING PACK 10 & 50 & 100 MG PO TBPK: ORAL_TABLET | ORAL | 0 refills | Status: DC

## 2019-03-04 MED ORDER — VENETOCLAX 10 MG PO TABS: 10.0000 mg | ORAL_TABLET | Freq: Every day | ORAL | 0 refills | Status: DC

## 2019-03-04 NOTE — Telephone Encounter (Signed)
Oral Oncology Patient Advocate Encounter  Spoke to patient to let him know that I have scheduled the [facility] to fill his Venclexta on Monday 03/07/2019.  Copays are covered by [facility] grant, so patient has no out of pocket cost.  He was very grateful the grant was active and covered his copay.  Nuala Alpha, oral chemo pharmacist, will pick up medication and deliver to patient at his upcoming appointment.  [place] Patient [place] Phone [phone] Fax [phone] 03/04/2019 3:15 PM

## 2019-03-07 MED FILL — VENCLEXTA STARTING PACK: 10 & 50 & 1 | 28 days supply | Qty: 42 | Fill #0

## 2019-03-07 MED FILL — VENCLEXTA 10 MG TABS: 10 | 14 days supply | Qty: 14 | Fill #0

## 2019-03-08 ENCOUNTER — Other Ambulatory Visit: Payer: Self-pay

## 2019-03-08 ENCOUNTER — Ambulatory Visit: Payer: Medicare HMO | Attending: Internal Medicine | Admitting: Physical Therapy

## 2019-03-08 ENCOUNTER — Encounter: Payer: Self-pay | Admitting: Physical Therapy

## 2019-03-08 DIAGNOSIS — M6281 Muscle weakness (generalized): Secondary | ICD-10-CM | POA: Insufficient documentation

## 2019-03-08 DIAGNOSIS — R262 Difficulty in walking, not elsewhere classified: Secondary | ICD-10-CM | POA: Diagnosis not present

## 2019-03-08 DIAGNOSIS — C4441 Basal cell carcinoma of skin of scalp and neck: Secondary | ICD-10-CM | POA: Diagnosis not present

## 2019-03-08 NOTE — Therapy (Signed)
[place] MAIN [facility] SERVICES [address] [place], [place], [address] Phone: [phone]   Fax:  [phone]  Physical Therapy Evaluation  Patient Details  Name: [name] MRN: [id number] Date of Birth: 08/28/1945 Referring Provider (PT): Cindra Presume   Encounter Date: 03/08/2019  PT End of Session - 03/08/19 1522    Visit Number  1    Number of Visits  17    Date for PT Re-Evaluation  05/03/19    PT Start Time  0300    PT Stop Time  0400    PT Time Calculation (min)  60 min    Equipment Utilized During Treatment  Gait belt    Activity Tolerance  Patient tolerated treatment well    Behavior During Therapy  [facility] for tasks assessed/performed       Past Medical History:  Diagnosis Date  . Anxiety   . Arthritis   . Atrial fibrillation ([place])    a. Dx 2013, recurred 02/2014, CHA2DS2VASc = 3 -->placed on Eliquis;  b. 02/2014 Echo: EF 50-55%, mid and apical anterior septum and mid and apical inf septum are abnl, mild to mod Ao sclerosis w/o AS.  Marland Kitchen Chicken pox   . Chronic lymphocytic leukemia ([place])    a. Dx 02/2014.  Marland Kitchen Complication of anesthesia    History of  PTSD--do not touch patient when waking up from surgery.  Marland Kitchen COPD (chronic obstructive pulmonary disease) ([place])   . Coronary artery disease    a. 04/2009 CABG x 3 (LIMA->LAD, VG->OM1, VG->PDA);  b. 09/2009 Cath: occluded VG x 2 w/ patent LIMA and L->R collats. EF 55%, mild antlat HK;  c. 10/2011 MV: EF 53%, no isch/infarct-->low risk.  Marland Kitchen Dysrhythmia    hx of a-fib  . GERD (gastroesophageal reflux disease)    occasional  . History of chemotherapy 2015-2016  . HOH (hard of hearing)    Bilateral Hearing Aids  . Hypertension   . Myocardial infarction ([place]) 2010  . OSA on CPAP    USE C-PAP  . Presence of permanent cardiac pacemaker 2017  . PTSD (post-traumatic stress disorder)   . PTSD (post-traumatic stress disorder)   . Pure hypercholesterolemia   . Rheumatic fever 1959   . 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 ABDOMINAL HERNIA REPAIR    . APPENDECTOMY  06/21/1985  . CARDIAC CATHETERIZATION  2010; 2011   ; Dr Fletcher Anon  . CORONARY ARTERY BYPASS GRAFT  04/2009   "CABG X3"  . EP IMPLANTABLE DEVICE N/A 03/03/2016   Procedure: Pacemaker Implant;  Surgeon: Deboraha Sprang, MD;  Location: [place] [facility];  Service: Cardiovascular;  Laterality: N/A;  . FOREIGN BODY REMOVAL  1968   "shrapnel in my tailbone"  . INGUINAL HERNIA REPAIR Right   . INSERT / REPLACE / REMOVE PACEMAKER    . JOINT REPLACEMENT Right 2018  . LAPAROSCOPIC CHOLECYSTECTOMY    . TONSILLECTOMY AND ADENOIDECTOMY  1956  . TOTAL HIP ARTHROPLASTY Right 10/22/2016   Procedure: TOTAL HIP ARTHROPLASTY;  Surgeon: Dereck Leep, MD;  Location: [facility];  Service: Orthopedics;  Laterality: Right;  . TOTAL HIP ARTHROPLASTY Left 11/04/2017   Procedure: TOTAL HIP ARTHROPLASTY;  Surgeon: Dereck Leep, MD;  Location: [facility] OR[facility];  Service: Orthopedics;  Laterality: Left;    There were no vitals filed for this visit.   Subjective Assessment - 03/08/19 1512    Subjective  Patient is having balance problems and pain in BLE.    Pertinent History  Patient is having muscle pain in BLE and calfs. He is having pain that comes and goes but never goes away all the way. He is having balance difficulty and he is stumbling. He is on a blood thinner and had abdominal anersum.         [facility] PT Assessment - 03/08/19 1514      Assessment   Medical Diagnosis  balance imbalance    Referring Provider (PT)  Cindra Presume    Onset Date/Surgical Date  02/28/19    Hand Dominance  Right    Prior Therapy  no      Precautions   Precautions  Fall      Restrictions   Weight Bearing Restrictions  No      Balance Screen   Has the patient fallen in the past 6 months  Yes    How many times?  4    Has the patient had a decrease in activity level  because of a fear of falling?   Yes    Is the patient reluctant to leave their home because of a fear of falling?   No      Home Environment   Living Environment  Private residence    Living Arrangements  Spouse/significant other    Available Help at Discharge  Family    Type of [place] to enter    Entrance Stairs-Number of Steps  [place]  One level    [place] - single point      Prior Function   Level of Independence  Independent with household mobility with device;Independent with basic ADLs    Vocation  Retired    Leisure  Astronomer, Sales promotion account executive, fishing, Armed forces training and education officer   Overall Cognitive Status  Within Functional Limits for tasks assessed      [facility]  Yes      Standardized Balance Assessment   Standardized Balance Assessment  Chief Technology Officer Test   Sit to Stand  Able to stand  independently using hands    Standing Unsupported  Able to stand 2 minutes with supervision    Sitting with Back Unsupported but Feet Supported on Floor or Stool  Able to sit safely and securely 2 minutes    Stand to Sit  Sits safely with minimal use of hands    Transfers  Able to transfer safely, minor use of hands    Standing Unsupported with Eyes Closed  Able to stand 10 seconds with supervision    Standing Unsupported with Feet Together  Able to place feet together independently but unable to hold for 30 seconds    From Standing, Reach Forward with Outstretched Arm  Can reach forward >12 cm safely (5")    From Standing Position, Pick up Object from Floor  Able to pick up shoe, needs supervision    From Standing Position, Turn to Look Behind Over each Shoulder  Looks behind from both sides and weight shifts well    Turn 360 Degrees  Able to turn 360 degrees safely but slowly    Standing Unsupported, Alternately Place Feet on Step/Stool  Able to stand independently and  complete 8 steps >20 seconds    Standing Unsupported,  One Foot in [employer] balance while stepping or standing    Standing on One Leg  Tries to lift leg/unable to hold 3 seconds but remains standing independently    Total Score  39          POSTURE: WNL   PROM/AROM: WFL BUE and BLE  STRENGTH:  Graded on a 0-5 scale Muscle Group Left Right                          Hip Flex 3/5 3/5  Hip Abd 3/5 3/5      Hip Ext 2/5 2/5      Knee Flex 4/5 4/5  Knee Ext 4/5 4/5  Ankle DF 4/5 4/5  Ankle PF 2/5 2/5   SENSATION: WFL   FUNCTIONAL MOBILITY: Needs to be sitting with head and torso elevated due to COPD , difficulty moving supine <> sidelying <> supine   BALANCE: Static Standing Balance  Normal Able to maintain standing balance against maximal resistance   Good Able to maintain standing balance against moderate resistance   Good-/Fair+ Able to maintain standing balance against minimal resistance   Fair Able to stand unsupported without UE support and without LOB for 1-2 min x  Fair- Requires Min A and UE support to maintain standing without loss of balance   Poor+ Requires mod A and UE support to maintain standing without loss of balance   Poor Requires max A and UE support to maintain standing balance without loss    Standing Dynamic Balance  Normal Stand independently unsupported, able to weight shift and cross midline maximally   Good Stand independently unsupported, able to weight shift and cross midline moderately   Good-/Fair+ Stand independently unsupported, able to weight shift across midline minimally   Fair Stand independently unsupported, weight shift, and reach ipsilaterally, loss of balance when crossing midline   Poor+ Able to stand with Min A and reach ipsilaterally, unable to weight shift x  Poor Able to stand with Mod A and minimally reach ipsilaterally, unable to cross midline.       GAIT: Patient ambulates with spc and decreased gait speed with  decreased step height and decreased step length  OUTCOME MEASURES: TEST Outcome Interpretation  5 times sit<>stand 39.73sec >25 yo, >15 sec indicates increased risk for falls  10 meter walk test        .57         m/s <1.0 m/s indicates increased risk for falls; limited community ambulator  Timed up and Go        22.18         sec <14 sec indicates increased risk for falls  Berg balance  39/56 <36/56 (100% risk for falls), 37-45 (80% risk for falls); 46-51 (>50% risk for falls); 52-55 (lower risk <25% of falls)                    Objective measurements completed on examination: See above findings.              PT Education - 03/08/19 1521    Education Details  plan of care    Person(s) Educated  Patient    Methods  Explanation    Comprehension  Verbalized understanding       PT Short Term Goals - 03/08/19 1806      PT SHORT TERM GOAL #1   Title  Patient will be independent in home exercise program  to improve strength/mobility for better functional independence with ADLs.    Time  4    Period  Weeks    Status  New    Target Date  04/05/19      PT SHORT TERM GOAL #2   Title  Patient (< 74 years old) will complete five times sit to stand test in < 10 seconds indicating an increased LE strength and improved balance.    Time  4    Period  Weeks    Status  New    Target Date  04/05/19        PT Long Term Goals - 03/08/19 1809      PT LONG TERM GOAL #1   Title  Patient (> 50 years old) will complete five times sit to stand test in < 15 seconds indicating an increased LE strength and improved balance.    Time  8    Period  Weeks    Status  New    Target Date  05/03/19      PT LONG TERM GOAL #2   Title  Patient will increase Berg Balance score by > 6 points to demonstrate decreased fall risk during functional activities.    Time  8    Period  Weeks    Status  New    Target Date  05/03/19      PT LONG TERM GOAL #3   Title  Patient will increase 10  meter walk test to >1.58m/s as to improve gait speed for better community ambulation and to reduce fall risk.    Time  8    Period  Weeks    Status  New    Target Date  05/03/19             Plan - 03/08/19 1747    Clinical Impression Statement  Patient is 73 year old male, presents  with BLE weakness, static and dynamic balance deficits, pain in BLE and poor mobiity. He has decreased outcome measures that indicate falls risk with berg balance 39/56. He ambulates with spc with decreased gait speed. He will benefit from skilled PT to improve mobiity and safety.    Personal Factors and Comorbidities  Age;Comorbidity 1    Comorbidities  COPD,    Examination-Participation Restrictions  Yard Work;Cleaning;Community Activity    Stability/Clinical Decision Making  Evolving/Moderate complexity    Clinical Decision Making  Moderate    Rehab Potential  Fair    PT Frequency  2x / week    PT Duration  8 weeks    PT Treatment/Interventions  Gait training;Therapeutic activities;Balance training;Therapeutic exercise;Patient/family education    PT Next Visit Plan  strengthening and balance    PT Home Exercise Plan  heel raises, hip abd, hip ext YTB    Consulted and Agree with Plan of Care  Patient       Patient will benefit from skilled therapeutic intervention in order to improve the following deficits and impairments:  Abnormal gait, Decreased balance, Decreased mobility, Difficulty walking, Cardiopulmonary status limiting activity, Decreased activity tolerance, Decreased strength, Pain  Visit Diagnosis: 1. Difficulty in walking, not elsewhere classified   2. Muscle weakness (generalized)        Problem List Patient Active Problem List   Diagnosis Date Noted  . Gross hematuria 05/12/2018  . Sick sinus syndrome ([place]) 07/08/2016  . Goals of care, counseling/discussion 07/08/2016  . 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 Chronic fatigue   . Atherosclerosis of  coronary artery  bypass graft of native heart   . PAD (peripheral artery disease) ([place])   . OSA on CPAP   . TIA (transient ischemic attack) 11/02/2015  . Chronic diastolic CHF (congestive heart failure) ([place]) 09/20/2015  . CLL (chronic lymphocytic leukemia) ([place])   . PTSD (post-traumatic stress disorder)   . Osteoarthritis of both knees 07/05/2015  . COPD (chronic obstructive pulmonary disease) ([place]) 01/01/2012  . Shortness of breath 10/09/2011  . Paroxysmal atrial fibrillation ([place]) 10/09/2011  . Hyperlipidemia 11/28/2009  . Essential hypertension 11/28/2009    Alanson Puls, PT DPT 03/08/2019, 6:12 PM  [place] MAIN [facility] SERVICES [address] [place], [place], [address] Phone: [phone]   Fax:  [phone]  Name: [name] MRN: [id number] Date of Birth: 14-Apr-1946

## 2019-03-11 ENCOUNTER — Ambulatory Visit (INDEPENDENT_AMBULATORY_CARE_PROVIDER_SITE_OTHER): Payer: Medicare HMO | Admitting: *Deleted

## 2019-03-11 ENCOUNTER — Other Ambulatory Visit: Payer: Self-pay

## 2019-03-11 DIAGNOSIS — I495 Sick sinus syndrome: Secondary | ICD-10-CM | POA: Diagnosis not present

## 2019-03-13 LAB — CUP PACEART REMOTE DEVICE CHECK
Battery Remaining Longevity: 87 mo
Battery Voltage: 3.01 V
Brady Statistic AP VP Percent: 0.27 %
Brady Statistic AP VS Percent: 93.72 %
Brady Statistic AS VP Percent: 0 %
Brady Statistic AS VS Percent: 6.01 %
Brady Statistic RA Percent Paced: 93.94 %
Brady Statistic RV Percent Paced: 0.28 %
Date Time Interrogation Session: 20200821135452
Implantable Lead Implant Date: 20170814
Implantable Lead Implant Date: 20170814
Implantable Lead Location: 753859
Implantable Lead Location: 753860
Implantable Lead Model: 5076
Implantable Lead Model: 5076
Implantable Pulse Generator Implant Date: 20170814
Lead Channel Impedance Value: 323 Ohm
Lead Channel Impedance Value: 437 Ohm
Lead Channel Impedance Value: 513 Ohm
Lead Channel Impedance Value: 608 Ohm
Lead Channel Pacing Threshold Amplitude: 0.75 V
Lead Channel Pacing Threshold Amplitude: 0.75 V
Lead Channel Pacing Threshold Pulse Width: 0.4 ms
Lead Channel Pacing Threshold Pulse Width: 0.4 ms
Lead Channel Sensing Intrinsic Amplitude: 19.625 mV
Lead Channel Sensing Intrinsic Amplitude: 19.625 mV
Lead Channel Sensing Intrinsic Amplitude: 3 mV
Lead Channel Sensing Intrinsic Amplitude: 3 mV
Lead Channel Setting Pacing Amplitude: 2 V
Lead Channel Setting Pacing Amplitude: 2.5 V
Lead Channel Setting Pacing Pulse Width: 0.4 ms
Lead Channel Setting Sensing Sensitivity: 0.9 mV

## 2019-03-14 ENCOUNTER — Telehealth: Payer: Self-pay | Admitting: Pharmacist

## 2019-03-14 ENCOUNTER — Inpatient Hospital Stay: Payer: Medicare HMO

## 2019-03-14 ENCOUNTER — Other Ambulatory Visit: Payer: Self-pay

## 2019-03-14 ENCOUNTER — Encounter: Payer: Self-pay | Admitting: Internal Medicine

## 2019-03-14 ENCOUNTER — Inpatient Hospital Stay (HOSPITAL_BASED_OUTPATIENT_CLINIC_OR_DEPARTMENT_OTHER): Payer: Medicare HMO | Admitting: Internal Medicine

## 2019-03-14 DIAGNOSIS — E860 Dehydration: Secondary | ICD-10-CM | POA: Insufficient documentation

## 2019-03-14 DIAGNOSIS — C9110 Chronic lymphocytic leukemia of B-cell type not having achieved remission: Secondary | ICD-10-CM

## 2019-03-14 DIAGNOSIS — Z5112 Encounter for antineoplastic immunotherapy: Secondary | ICD-10-CM | POA: Diagnosis not present

## 2019-03-14 LAB — COMPREHENSIVE METABOLIC PANEL
ALT: 30 U/L (ref 0–44)
AST: 18 U/L (ref 15–41)
Albumin: 3.5 g/dL (ref 3.5–5.0)
Alkaline Phosphatase: 119 U/L (ref 38–126)
Anion gap: 8 (ref 5–15)
BUN: 18 mg/dL (ref 8–23)
CO2: 25 mmol/L (ref 22–32)
Calcium: 8.9 mg/dL (ref 8.9–10.3)
Chloride: 110 mmol/L (ref 98–111)
Creatinine, Ser: 1.01 mg/dL (ref 0.61–1.24)
GFR calc Af Amer: >60 mL/min (ref >60)
GFR calc non Af Amer: >60 mL/min (ref >60)
Glucose, Bld: 198 mg/dL — ABNORMAL HIGH (ref 70–99)
Potassium: 4.1 mmol/L (ref 3.5–5.1)
Sodium: 143 mmol/L (ref 135–145)
Total Bilirubin: 0.7 mg/dL (ref 0.3–1.2)
Total Protein: 6.6 g/dL (ref 6.5–8.1)

## 2019-03-14 LAB — BASIC METABOLIC PANEL
Anion gap: 8 (ref 5–15)
BUN: 17 mg/dL (ref 8–23)
CO2: 23 mmol/L (ref 22–32)
Calcium: 8.5 mg/dL — ABNORMAL LOW (ref 8.9–10.3)
Chloride: 110 mmol/L (ref 98–111)
Creatinine, Ser: 0.9 mg/dL (ref 0.61–1.24)
GFR calc Af Amer: >60 mL/min (ref >60)
GFR calc non Af Amer: >60 mL/min (ref >60)
Glucose, Bld: 205 mg/dL — ABNORMAL HIGH (ref 70–99)
Potassium: 4 mmol/L (ref 3.5–5.1)
Sodium: 141 mmol/L (ref 135–145)

## 2019-03-14 LAB — CBC WITH DIFFERENTIAL/PLATELET
Abs Immature Granulocytes: 0.1 10*3/uL — ABNORMAL HIGH (ref 0.00–0.07)
Basophils Absolute: 0 10*3/uL (ref 0.0–0.1)
Basophils Relative: 0 %
Eosinophils Absolute: 0 10*3/uL (ref 0.0–0.5)
Eosinophils Relative: 0 %
HCT: 33.4 % — ABNORMAL LOW (ref 39.0–52.0)
Hemoglobin: 10.9 g/dL — ABNORMAL LOW (ref 13.0–17.0)
Immature Granulocytes: 1 %
Lymphocytes Relative: 21 %
Lymphs Abs: 2.3 10*3/uL (ref 0.7–4.0)
MCH: 32.7 pg (ref 26.0–34.0)
MCHC: 32.6 g/dL (ref 30.0–36.0)
MCV: 100.3 fL — ABNORMAL HIGH (ref 80.0–100.0)
Monocytes Absolute: 0.5 10*3/uL (ref 0.1–1.0)
Monocytes Relative: 5 %
Neutro Abs: 8 10*3/uL — ABNORMAL HIGH (ref 1.7–7.7)
Neutrophils Relative %: 73 %
Platelets: 176 10*3/uL (ref 150–400)
RBC: 3.33 MIL/uL — ABNORMAL LOW (ref 4.22–5.81)
RDW: 15.7 % — ABNORMAL HIGH (ref 11.5–15.5)
WBC: 10.9 10*3/uL — ABNORMAL HIGH (ref 4.0–10.5)
nRBC: 0 % (ref 0.0–0.2)

## 2019-03-14 LAB — MAGNESIUM: Magnesium: 2.2 mg/dL (ref 1.7–2.4)

## 2019-03-14 LAB — URIC ACID
Uric Acid, Serum: 1.9 mg/dL — ABNORMAL LOW (ref 3.7–8.6)
Uric Acid, Serum: 1.7 mg/dL — ABNORMAL LOW (ref 3.7–8.6)

## 2019-03-14 LAB — LACTATE DEHYDROGENASE
LDH: 154 U/L (ref 98–192)
LDH: 150 U/L (ref 98–192)

## 2019-03-14 LAB — PHOSPHORUS: Phosphorus: 3.4 mg/dL (ref 2.5–4.6)

## 2019-03-14 MED ORDER — SODIUM CHLORIDE 0.9 % IV SOLN
Freq: Once | INTRAVENOUS | Status: AC
  Administered 2019-03-14: 11:00:00 via INTRAVENOUS
  Filled 2019-03-14: qty 250

## 2019-03-14 NOTE — Progress Notes (Signed)
[place] OFFICE PROGRESS NOTE  Patient Care Team: Leone Haven, MD as PCP - General (Family Medicine) [name], MD as Consulting Physician (Cardiology) Cammie Sickle, MD as Medical Oncologist (Hematology and Oncology)  Cancer Staging No matching staging information was found for the patient.   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  MARCH 2017- CT N/C/A/P- NED. STOP Ritux; surveillance   # AUG 2019- CT/PET- progression/NO transformation;  # NOV 2019- Progression; started Ibrutinib 420 mg/d. STOPPED in end of feb sec to extreme fatigue/joint pains/cramps  #November 01, 2018-start ibrutinib 280 mg a day; December 20, 2018-discontinue ibrutinib secondary multiple side effects.   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; IGVH- UN-MUTATED [poor prognosis]  DIAGNOSIS: CLL  STAGE: IV  ;GOALS: control  CURRENT/MOST RECENT THERAPY : Gazyva + venetoclax [pending]    CLL (chronic lymphocytic leukemia) ([place]  01/03/2019 -  Chemotherapy   The patient had obinutuzumab (GAZYVA) 100 mg in sodium chloride 0.9 % 100 mL (0.9615 mg/mL) chemo infusion, 100 mg, Intravenous, Once, 3 of 6 cycles Administration: 100 mg (01/03/2019), 900 mg (01/04/2019), 1,000 mg (01/10/2019), 1,000 mg (01/31/2019), 1,000 mg (01/17/2019), 1,000 mg (02/28/2019)  for chemotherapy treatment.      INTERVAL HISTORY:  Michael DELAPENA89y.o.  male CLL-high risk currently on GDyann Kiefis here for follow-up; currently status post 3 cycles is here for follow-up.  Patient had cycle #3 approximately 2 weeks ago.  Patient is here to proceed with  venetoclax.  Appetite is good with no weight loss.  Continues to have gait instability/joint pains for which he is getting physical therapy.  Review of Systems  Constitutional: Positive for malaise/fatigue. Negative for chills, diaphoresis and fever.  HENT: Negative for nosebleeds and sore throat.   Eyes: Negative for double vision.  Respiratory: Negative for cough, hemoptysis, sputum production, shortness of breath and wheezing.   Cardiovascular: Negative for chest pain, palpitations, orthopnea and leg swelling.  Gastrointestinal: Negative for abdominal pain, blood in stool, constipation, diarrhea, heartburn, melena, nausea and vomiting.  Genitourinary: Negative for dysuria, frequency and urgency.  Musculoskeletal: Positive for back pain and joint pain.  Skin: Negative.  Negative for itching and rash.  Neurological: Negative for dizziness, tingling, focal weakness and headaches.  Psychiatric/Behavioral: Negative for depression. The patient is not nervous/anxious and does not have insomnia.       PAST MEDICAL HISTORY :  Past Medical History:  Diagnosis Date  . Anxiety   . Arthritis   . Atrial fibrillation ([facility]    a. Dx 2013, recurred 02/2014, CHA2DS2VASc = 3 -->placed on Eliquis;  b. 02/2014 Echo: EF 50-55%, mid and apical anterior septum and mid and apical inf septum are abnl, mild to mod Ao sclerosis w/o AS.  .Marland KitchenChicken pox   . [place]    a. Dx 02/2014.  .Marland KitchenComplication of anesthesia    History of  PTSD--do not touch patient when waking up from surgery.  .Marland KitchenCOPD (chronic obstructive pulmonary disease) ([place]   . Coronary artery disease    a. 04/2009 CABG x 3 (LIMA->LAD, VG->OM1, VG->PDA);  b. 09/2009  Cath: occluded VG x 2 w/ patent LIMA and L->R collats. EF 55%, mild antlat HK;  c. 10/2011 MV: EF 53%, no isch/infarct-->low risk.  Marland Kitchen Dysrhythmia    hx of a-fib  . GERD (gastroesophageal reflux disease)    occasional  . History of chemotherapy 2015-2016  . HOH  (hard of hearing)    Bilateral Hearing Aids  . Hypertension   . Myocardial infarction ([place]) 2010  . OSA on CPAP    USE C-PAP  . Presence of permanent cardiac pacemaker 2017  . PTSD (post-traumatic stress disorder)   . PTSD (post-traumatic stress disorder)   . Pure hypercholesterolemia   . Rheumatic fever 1959  . 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 ABDOMINAL HERNIA REPAIR    . APPENDECTOMY  06/21/1985  . CARDIAC CATHETERIZATION  2010; 2011   ; Dr Fletcher Anon  . CORONARY ARTERY BYPASS GRAFT  04/2009   "CABG X3"  . EP IMPLANTABLE DEVICE N/A 03/03/2016   Procedure: Pacemaker Implant;  Surgeon: Deboraha Sprang, MD;  Location: [facility];  Service: Cardiovascular;  Laterality: N/A;  . FOREIGN BODY REMOVAL  1968   "shrapnel in my tailbone"  . INGUINAL HERNIA REPAIR Right   . INSERT / REPLACE / REMOVE PACEMAKER    . JOINT REPLACEMENT Right 2018  . LAPAROSCOPIC CHOLECYSTECTOMY    . TONSILLECTOMY AND ADENOIDECTOMY  1956  . TOTAL HIP ARTHROPLASTY Right 10/22/2016   Procedure: TOTAL HIP ARTHROPLASTY;  Surgeon: Dereck Leep, MD;  Location: [facility];  Service: Orthopedics;  Laterality: Right;  . TOTAL HIP ARTHROPLASTY Left 11/04/2017   Procedure: TOTAL HIP ARTHROPLASTY;  Surgeon: Dereck Leep, MD;  Location: [facility];  Service: Orthopedics;  Laterality: Left;    FAMILY HISTORY :   Family History  Problem Relation Age of Onset  . Heart disease Mother   . Heart attack Mother   . Coronary artery disease Other        family history    SOCIAL HISTORY:   Social History   Tobacco Use  . Smoking status: Former Smoker    Packs/day: 1.00    Years: 40.00    Pack years: 40.00    Types: Cigarettes    Quit date: 07/21/2006    Years since quitting: 12.6  . Smokeless tobacco: Never Used  Substance Use Topics  . Alcohol use: Yes    Alcohol/week: 1.0 standard drinks    Types: 1 Standard  drinks or equivalent per week    Comment: rarely  . Drug use: No    ALLERGIES:  has No Known Allergies.  MEDICATIONS:  Current Outpatient Medications  Medication Sig Dispense Refill  . acetaminophen (TYLENOL) 500 MG tablet Take 1,000 mg by mouth every 8 (eight) hours as needed for mild pain.    Marland Kitchen acyclovir (ZOVIRAX) 400 MG tablet One pill a day [to prevent shingles] 30 tablet 3  . albuterol (PROVENTIL HFA;VENTOLIN HFA) 108 (90 Base) MCG/ACT inhaler Inhale 2 puffs into the lungs every 6 (six) hours as needed for wheezing or shortness of breath. 1 Inhaler 11  . allopurinol (ZYLOPRIM) 300 MG tablet Take 1 tablet by mouth twice daily 120 tablet 3  . amiodarone (PACERONE) 200 MG tablet TAKE 1/2 TABLET (100 mg)  BY MOUTH TWICE DAILY 90 tablet 2  . apixaban (ELIQUIS) 5 MG TABS tablet Take 5 mg by mouth 2 (two) times daily.    Marland Kitchen  atorvastatin (LIPITOR) 80 MG tablet Take 1 tablet (80 mg total) by mouth at bedtime. 90 tablet 3  . budesonide-formoterol (SYMBICORT) 160-4.5 MCG/ACT inhaler Inhale 2 puffs into the lungs 2 (two) times daily. 1 Inhaler 11  . cetirizine (ZYRTEC) 10 MG tablet Take 10 mg by mouth daily as needed for allergies.     . Coenzyme Q10 (COQ10) 200 MG CAPS Take 200 mg by mouth daily.    Marland Kitchen dexamethasone (DECADRON) 4 MG tablet Start 2 days prior to infusion; Take for 2 days. Do not take on the day of infusion. 60 tablet 3  . ezetimibe (ZETIA) 10 MG tablet Take 1 tablet (10 mg total) by mouth daily. 90 tablet 3  . Krill Oil 350 MG CAPS Take 350 mg by mouth every evening.    . metoprolol tartrate (LOPRESSOR) 25 MG tablet Take 25 mg by mouth 2 (two) times daily.    . mirtazapine (REMERON) 15 MG tablet Take 15 mg by mouth at bedtime as needed (for panic associated with PTSD).     Marland Kitchen montelukast (SINGULAIR) 10 MG tablet Take 1 tablet (10 mg total) by mouth at bedtime. Start 2 days prior to infusion. Take it for 4 days. 60 tablet 0  . Multiple Vitamin (MULTIVITAMIN WITH MINERALS) TABS tablet  Take 1 tablet by mouth daily.    . mupirocin ointment (BACTROBAN) 2 % Apply 1 application topically 3 (three) times daily. 30 g 1  . tiotropium (SPIRIVA) 18 MCG inhalation capsule Place 1 capsule (18 mcg total) into inhaler and inhale at bedtime. 30 capsule 11  . traMADol (ULTRAM) 50 MG tablet TAKE 1 TABLET BY MOUTH EVERY 12 HOURS AS NEEDED FOR MODERATE PAIN 90 tablet 0  . venetoclax 10 MG TABS Take 10 mg by mouth daily. Take as directed. 14 tablet 0  . nitroGLYCERIN (NITROSTAT) 0.4 MG SL tablet Place 1 tablet (0.4 mg total) under the tongue every 5 (five) minutes as needed for chest pain. (Patient not taking: Reported on 01/10/2019) 25 tablet 6  . venetoclax (VENCLEXTA STARTING PACK) 10 & 50 & 100 MG TBPK Take '20mg'$  by mouth daily for week one, '50mg'$  daily for week 2, '100mg'$  daily for week 3, '200mg'$  daily for week 4. Take as directed. (Patient not taking: Reported on 03/14/2019) 1 each 0   No current facility-administered medications for this visit.     PHYSICAL EXAMINATION: ECOG PERFORMANCE STATUS: 1 - Symptomatic but completely ambulatory  BP (!) 175/78   Pulse 64   Temp (!) 97.5 F (36.4 C) (Tympanic)   Resp 20   Wt 225 lb (102.1 kg)   BMI 33.23 kg/m   Filed Weights   03/14/19 0918  Weight: 225 lb (102.1 kg)    Physical Exam  Constitutional: He is oriented to person, place, and time and well-developed, well-nourished, and in no distress.  Patient is alone.  HENT:  Head: Normocephalic and atraumatic.  Mouth/Throat: Oropharynx is clear and moist. No oropharyngeal exudate.  Eyes: Pupils are equal, round, and reactive to light.  Neck: Normal range of motion. Neck supple.  Cardiovascular: Normal rate and regular rhythm.  Pulmonary/Chest: No respiratory distress. He has no wheezes.  Abdominal: Soft. Bowel sounds are normal. He exhibits no distension and no mass. There is no abdominal tenderness. There is no rebound and no guarding.  Musculoskeletal: Normal range of motion.         General: No tenderness or edema.  Lymphadenopathy:  Resolved lymph nodes in the neck underarms.  Neurological: He  is alert and oriented to person, place, and time.  Skin: Skin is warm.  Psychiatric: Affect normal.       LABORATORY DATA:  I have reviewed the data as listed    Component Value Date/Time   NA 143 03/14/2019 0854   NA 139 10/11/2014 1800   K 4.1 03/14/2019 0854   K 3.3 (L) 10/11/2014 1800   CL 110 03/14/2019 0854   CL 106 10/11/2014 1800   CO2 25 03/14/2019 0854   CO2 27 10/11/2014 1800   GLUCOSE 198 (H) 03/14/2019 0854   GLUCOSE 107 (H) 10/11/2014 1800   BUN 18 03/14/2019 0854   BUN 15 10/11/2014 1800   CREATININE 1.01 03/14/2019 0854   CREATININE 0.89 10/11/2014 1800   CALCIUM 8.9 03/14/2019 0854   CALCIUM 8.8 (L) 10/11/2014 1800   PROT 6.6 03/14/2019 0854   PROT 6.7 05/18/2017 1048   PROT 6.4 (L) 10/11/2014 1800   ALBUMIN 3.5 03/14/2019 0854   ALBUMIN 4.3 05/18/2017 1048   ALBUMIN 4.1 10/11/2014 1800   AST 18 03/14/2019 0854   AST 23 10/11/2014 1800   ALT 30 03/14/2019 0854   ALT 22 10/11/2014 1800   ALKPHOS 119 03/14/2019 0854   ALKPHOS 61 10/11/2014 1800   BILITOT 0.7 03/14/2019 0854   BILITOT 0.7 05/18/2017 1048   BILITOT 0.9 10/11/2014 1800   GFRNONAA >60 03/14/2019 0854   GFRNONAA >60 10/11/2014 1800   GFRAA >60 03/14/2019 0854   GFRAA >60 10/11/2014 1800    No results found for: SPEP, UPEP  Lab Results  Component Value Date   WBC 10.9 (H) 03/14/2019   NEUTROABS 8.0 (H) 03/14/2019   HGB 10.9 (L) 03/14/2019   HCT 33.4 (L) 03/14/2019   MCV 100.3 (H) 03/14/2019   PLT 176 03/14/2019      Chemistry      Component Value Date/Time   NA 143 03/14/2019 0854   NA 139 10/11/2014 1800   K 4.1 03/14/2019 0854   K 3.3 (L) 10/11/2014 1800   CL 110 03/14/2019 0854   CL 106 10/11/2014 1800   CO2 25 03/14/2019 0854   CO2 27 10/11/2014 1800   BUN 18 03/14/2019 0854   BUN 15 10/11/2014 1800   CREATININE 1.01 03/14/2019 0854   CREATININE  0.89 10/11/2014 1800      Component Value Date/Time   CALCIUM 8.9 03/14/2019 0854   CALCIUM 8.8 (L) 10/11/2014 1800   ALKPHOS 119 03/14/2019 0854   ALKPHOS 61 10/11/2014 1800   AST 18 03/14/2019 0854   AST 23 10/11/2014 1800   ALT 30 03/14/2019 0854   ALT 22 10/11/2014 1800   BILITOT 0.7 03/14/2019 0854   BILITOT 0.7 05/18/2017 1048   BILITOT 0.9 10/11/2014 1800       RADIOGRAPHIC STUDIES: I have personally reviewed the radiological images as listed and agreed with the findings in the report. No results found.   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 Currently s/p 3 cycles of Gazyva in 2 weeks.   # Proceed Venatoclax D-1; 10 mg/day x7 [sec to amiodarone]; today white count is 10.9 hemoglobin 10.9 platelets- 176.   # Spontaneous bruising-on Eliquis. STABLE.     # A.fib-on amiodarone sinus rhythm-on Eliquis-Stable; will discuss with cards re: DI with ven & amiodarone.   # TLS/infectious Prophylaxis-allopurinol; Bactrim/acyclovir; IV fluids today over 2 hours  DISPOSITION: # IVFs today # bmp/ldh/phos/uric acid TODAY at  2:00 pm  # tomorrow-bmp/ldh/mag phos/uric acid- # in 1 weeks/ MD- labs- cbc/cmp/ldh/mag phos/uric acid; IV Fluids over 2 hours-  Dr.B     Orders Placed This Encounter  Procedures  . Basic metabolic panel    Standing Status:   Future    Standing Expiration Date:   03/13/2020  . Lactate dehydrogenase    Standing Status:   Future    Standing Expiration Date:   03/13/2020  . Phosphorus    Standing Status:   Future    Standing Expiration Date:   03/13/2020  . Uric acid    Standing Status:   Future    Standing Expiration Date:   03/13/2020  . Basic metabolic panel    Standing Status:   Future    Standing Expiration Date:   03/13/2020  . Lactate dehydrogenase    Standing Status:   Future    Standing Expiration Date:    03/13/2020  . Phosphorus    Standing Status:   Future    Standing Expiration Date:   03/13/2020  . Uric acid    Standing Status:   Future    Standing Expiration Date:   03/13/2020  . CBC with Differential/Platelet    Standing Status:   Future    Standing Expiration Date:   03/13/2020  . Comprehensive metabolic panel    Standing Status:   Future    Standing Expiration Date:   03/13/2020  . Lactate dehydrogenase    Standing Status:   Future    Standing Expiration Date:   03/13/2020  . Magnesium    Standing Status:   Future    Standing Expiration Date:   03/13/2020  . Phosphorus    Standing Status:   Future    Standing Expiration Date:   03/13/2020  . Uric acid    Standing Status:   Future    Standing Expiration Date:   03/13/2020   All questions were answered. The patient knows to call the clinic with any problems, questions or concerns.      Cammie Sickle, MD 03/14/2019 1:26 PM

## 2019-03-14 NOTE — Assessment & Plan Note (Addendum)
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 Currently s/p 3 cycles of Gazyva in 2 weeks.   # Proceed Venatoclax D-1; 10 mg/day x7 [sec to amiodarone]; today white count is 10.9 hemoglobin 10.9 platelets- 176.   # Spontaneous bruising-on Eliquis. STABLE.     # A.fib-on amiodarone sinus rhythm-on Eliquis-Stable; will discuss with cards re: DI with ven & amiodarone.   # TLS/infectious Prophylaxis-allopurinol; Bactrim/acyclovir; IV fluids today over 2 hours  DISPOSITION: # IVFs today # bmp/ldh/phos/uric acid TODAY at 2:00 pm  # tomorrow-bmp/ldh/mag phos/uric acid- # in 1 weeks/ MD- labs- cbc/cmp/ldh/mag phos/uric acid; IV Fluids over 2 hours-  Dr.B

## 2019-03-15 ENCOUNTER — Inpatient Hospital Stay: Payer: Medicare HMO

## 2019-03-15 ENCOUNTER — Other Ambulatory Visit: Payer: Self-pay

## 2019-03-15 DIAGNOSIS — Z5112 Encounter for antineoplastic immunotherapy: Secondary | ICD-10-CM | POA: Diagnosis not present

## 2019-03-15 DIAGNOSIS — C9110 Chronic lymphocytic leukemia of B-cell type not having achieved remission: Secondary | ICD-10-CM

## 2019-03-15 LAB — BASIC METABOLIC PANEL
Anion gap: 7 (ref 5–15)
BUN: 17 mg/dL (ref 8–23)
CO2: 25 mmol/L (ref 22–32)
Calcium: 8.7 mg/dL — ABNORMAL LOW (ref 8.9–10.3)
Chloride: 111 mmol/L (ref 98–111)
Creatinine, Ser: 1.03 mg/dL (ref 0.61–1.24)
GFR calc Af Amer: >60 mL/min (ref >60)
GFR calc non Af Amer: >60 mL/min (ref >60)
Glucose, Bld: 157 mg/dL — ABNORMAL HIGH (ref 70–99)
Potassium: 3.8 mmol/L (ref 3.5–5.1)
Sodium: 143 mmol/L (ref 135–145)

## 2019-03-15 LAB — PHOSPHORUS: Phosphorus: 2.9 mg/dL (ref 2.5–4.6)

## 2019-03-15 LAB — LACTATE DEHYDROGENASE: LDH: 154 U/L (ref 98–192)

## 2019-03-15 LAB — URIC ACID: Uric Acid, Serum: 2 mg/dL — ABNORMAL LOW (ref 3.7–8.6)

## 2019-03-15 NOTE — Telephone Encounter (Signed)
Oral Chemotherapy Pharmacist Encounter  Met with [name] prior to his office visit the Dr. Bradd Canary. Hand delivered the venetoclax dose pack for his first week of venetoclax. Due to the drug interaction with his amiodarone, we are starting in weekly dosing ramp up lower at '10mg'$ . He will then increase to '20mg'$ , '50mg'$ , '100mg'$ , and finally his goal dose of '200mg'$ . This planned alteration can be slowed if needed based on this clinical picture.  Patient Education I spoke with patient for overview of new oral chemotherapy medication:Venclexta (venetoclax) for the treatment of CLL in conjunction with obinutuzumab, planned duration until disease progression or unacceptable drug toxicity.   Pt is doing well. Counseled patient on administration, dosing, side effects, monitoring, drug-food interactions, safe handling, storage, and disposal. Patient will take '10mg'$  week one then increase weekly to '20mg'$ , '50mg'$ , '100mg'$ , and finally his goal dose of '200mg'$ .  Side effects include but not limited to: TLS syndrome, N/V, decreased WBC.    Reviewed with patient importance of keeping a medication schedule and plan for any missed doses.  Mr. Nephew voiced understanding and appreciation. All questions answered.  Provided patient with [facility] phone number. Patient knows to call the office with questions or concerns. Oral Chemotherapy [facility] will continue to follow.  Darl Pikes, PharmD, BCPS, [facility] Hematology/Oncology Clinical Pharmacist [facility]/HP/AP [facility] [phone]  03/15/2019 3:49 PM

## 2019-03-16 ENCOUNTER — Encounter: Payer: Self-pay | Admitting: Physical Therapy

## 2019-03-16 ENCOUNTER — Ambulatory Visit: Payer: Medicare HMO | Admitting: Physical Therapy

## 2019-03-16 DIAGNOSIS — M6281 Muscle weakness (generalized): Secondary | ICD-10-CM

## 2019-03-16 DIAGNOSIS — R262 Difficulty in walking, not elsewhere classified: Secondary | ICD-10-CM | POA: Diagnosis not present

## 2019-03-16 NOTE — Therapy (Signed)
[place] MAIN [facility] SERVICES [address] [place], [place], [address] Phone: [phone]   Fax:  [phone]  Physical Therapy Treatment  Patient Details  Name: [name] MRN: [id number] Date of Birth: [birth year] Referring Provider (PT): Cindra Presume   Encounter Date: 03/16/2019  PT End of Session - 03/16/19 1510    Visit Number  2    Number of Visits  17    Date for PT Re-Evaluation  05/03/19    PT Start Time  0300    PT Stop Time  0340    PT Time Calculation (min)  40 min    Equipment Utilized During Treatment  Gait belt    Activity Tolerance  Patient tolerated treatment well    Behavior During Therapy  [facility] for tasks assessed/performed       Past Medical History:  Diagnosis Date  . Anxiety   . Arthritis   . Atrial fibrillation ([place])    a. Dx 2013, recurred 02/2014, CHA2DS2VASc = 3 -->placed on Eliquis;  b. 02/2014 Echo: EF 50-55%, mid and apical anterior septum and mid and apical inf septum are abnl, mild to mod Ao sclerosis w/o AS.  Marland Kitchen Chicken pox   . Chronic lymphocytic leukemia ([place])    a. Dx 02/2014.  Marland Kitchen Complication of anesthesia    History of  PTSD--do not touch patient when waking up from surgery.  Marland Kitchen COPD (chronic obstructive pulmonary disease) ([place])   . Coronary artery disease    a. 04/2009 CABG x 3 (LIMA->LAD, VG->OM1, VG->PDA);  b. 09/2009 Cath: occluded VG x 2 w/ patent LIMA and L->R collats. EF 55%, mild antlat HK;  c. 10/2011 MV: EF 53%, no isch/infarct-->low risk.  Marland Kitchen Dysrhythmia    hx of a-fib  . GERD (gastroesophageal reflux disease)    occasional  . History of chemotherapy 2015-2016  . HOH (hard of hearing)    Bilateral Hearing Aids  . Hypertension   . Myocardial infarction ([place]) 2010  . OSA on CPAP    USE C-PAP  . Presence of permanent cardiac pacemaker 2017  . PTSD (post-traumatic stress disorder)   . PTSD (post-traumatic stress disorder)   . Pure hypercholesterolemia   . Rheumatic fever 1959  .  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 ABDOMINAL HERNIA REPAIR    . APPENDECTOMY  06/21/1985  . CARDIAC CATHETERIZATION  2010; 2011   ; Dr Fletcher Anon  . CORONARY ARTERY BYPASS GRAFT  04/2009   "CABG X3"  . EP IMPLANTABLE DEVICE N/A 03/03/2016   Procedure: Pacemaker Implant;  Surgeon: Deboraha Sprang, MD;  Location: [facility];  Service: Cardiovascular;  Laterality: N/A;  . FOREIGN BODY REMOVAL  1968   "shrapnel in my tailbone"  . INGUINAL HERNIA REPAIR Right   . INSERT / REPLACE / REMOVE PACEMAKER    . JOINT REPLACEMENT Right 2018  . LAPAROSCOPIC CHOLECYSTECTOMY    . TONSILLECTOMY AND ADENOIDECTOMY  1956  . TOTAL HIP ARTHROPLASTY Right 10/22/2016   Procedure: TOTAL HIP ARTHROPLASTY;  Surgeon: Dereck Leep, MD;  Location: [facility] OR[facility];  Service: Orthopedics;  Laterality: Right;  . TOTAL HIP ARTHROPLASTY Left 11/04/2017   Procedure: TOTAL HIP ARTHROPLASTY;  Surgeon: Dereck Leep, MD;  Location: [facility] OR[facility];  Service: Orthopedics;  Laterality: Left;    There were no vitals filed for this visit.  Subjective Assessment - 03/16/19 1508    Subjective  Patient is having balance problems and pain in BLE. He has a new medicine from the MD  and had the best day yesterday.    Pertinent History  Patient is having muscle pain in BLE and calfs. He is having pain that comes and goes but never goes away all the way. He is having balance difficulty and he is stumbling. He is on a blood thinner and had abdominal anersum.    Currently in Pain?  Yes    Pain Score  6     Pain Location  Leg    Pain Orientation  Right;Left    Pain Descriptors / Indicators  Aching    Pain Type  Chronic pain    Pain Radiating Towards  na    Aggravating Factors   na    Pain Relieving Factors  na    Effect of Pain on Daily Activities  na    Multiple Pain Sites  --   all over his body      Treatment: Octane fitness x 5 mins L 2 Leg press 100 lbs x 20  x 2, heel raises x 20 x 2  hooklying marching with head elevated 40 degrees x 10  SLR x 10 x 2 BLE, head elevated 40 deg Hip abd supine head elevated 40 degrees  x 10 x 2  SAQ with head elevated 40 deg and 5 sec hold x 10 BLE  Squats x 10  Standing hip extension x 10 , BLE Standing hip abd x 10 , BLE Sit to stand x 10  Standing heel raises x 10    Pt educated throughout session about proper posture and technique with exercises. Improved exercise technique, movement at target joints, use of target muscles after min to mod verbal, visual, tactile cues.                       PT Education - 03/16/19 1510    Education Details  HEP    Person(s) Educated  Patient    Methods  Explanation    Comprehension  Verbalized understanding;Returned demonstration;Tactile cues required;Need further instruction       PT Short Term Goals - 03/08/19 1806      PT SHORT TERM GOAL #1   Title  Patient will be independent in home exercise program to improve strength/mobility for better functional independence with ADLs.    Time  4    Period  Weeks    Status  New    Target Date  04/05/19      PT SHORT TERM GOAL #2   Title  Patient (< 74 years old) will complete five times sit to stand test in < 10 seconds indicating an increased LE strength and improved balance.    Time  4    Period  Weeks    Status  New    Target Date  04/05/19        PT Long Term Goals - 03/08/19 1809      PT LONG TERM GOAL #1   Title  Patient (> 46 years old) will complete five times sit to stand test in < 15 seconds indicating an increased LE strength and improved balance.    Time  8    Period  Weeks    Status  New    Target Date  05/03/19      PT LONG TERM GOAL #2   Title  Patient will increase Berg Balance score by > 6  points to demonstrate decreased fall risk during functional activities.    Time  8    Period  Weeks    Status  New    Target Date  05/03/19      PT LONG TERM GOAL #3   Title   Patient will increase 10 meter walk test to >1.32m/s as to improve gait speed for better community ambulation and to reduce fall risk.    Time  8    Period  Weeks    Status  New    Target Date  05/03/19            Plan - 03/16/19 1515    Clinical Impression Statement  Patient has weakness in BLE and slow gait speed with spc. He performs beginning LE exercises in open and closed chain with pain behaviors and rest periods. He will continue to benefit from skilled PT to improve strength and balance and safety.    Personal Factors and Comorbidities  Age;Comorbidity 1    Comorbidities  COPD,    Examination-Participation Restrictions  Yard Work;Cleaning;Community Activity    Stability/Clinical Decision Making  Evolving/Moderate complexity    Rehab Potential  Fair    PT Frequency  2x / week    PT Duration  8 weeks    PT Treatment/Interventions  Gait training;Therapeutic activities;Balance training;Therapeutic exercise;Patient/family education    PT Next Visit Plan  strengthening and balance    PT Home Exercise Plan  heel raises, hip abd, hip ext YTB    Consulted and Agree with Plan of Care  Patient       Patient will benefit from skilled therapeutic intervention in order to improve the following deficits and impairments:  Abnormal gait, Decreased balance, Decreased mobility, Difficulty walking, Cardiopulmonary status limiting activity, Decreased activity tolerance, Decreased strength, Pain  Visit Diagnosis: Difficulty in walking, not elsewhere classified  Muscle weakness (generalized)     Problem List Patient Active Problem List   Diagnosis Date Noted  . Dehydration 03/14/2019  . Gross hematuria 05/12/2018  . Sick sinus syndrome ([place]) 07/08/2016  . Goals of care, counseling/discussion 07/08/2016  . 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 Chronic fatigue   . 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 PAD (peripheral artery disease) ([place])   .  OSA on CPAP   . TIA (transient ischemic attack) 11/02/2015  . Chronic diastolic CHF (congestive heart failure) ([place]) 09/20/2015  . CLL (chronic lymphocytic leukemia) ([place])   . PTSD (post-traumatic stress disorder)   . Osteoarthritis of both knees 07/05/2015  . COPD (chronic obstructive pulmonary disease) ([place]) 01/01/2012  . Shortness of breath 10/09/2011  . Paroxysmal atrial fibrillation ([place]) 10/09/2011  . Hyperlipidemia 11/28/2009  . Essential hypertension 11/28/2009    Alanson Puls, PT DPT 03/16/2019, 3:15 PM  [place] MAIN [facility] SERVICES [address] [place], [place], [address] Phone: [phone]   Fax:  [phone]  Name: [name] MRN: [id number] Date of Birth: [birth year]

## 2019-03-18 ENCOUNTER — Encounter: Payer: Self-pay | Admitting: Internal Medicine

## 2019-03-18 ENCOUNTER — Encounter: Payer: Self-pay | Admitting: Cardiology

## 2019-03-18 ENCOUNTER — Other Ambulatory Visit: Payer: Self-pay

## 2019-03-18 NOTE — Progress Notes (Signed)
Patient stated that he had been doing well with no complaints. 

## 2019-03-18 NOTE — Progress Notes (Signed)
Remote pacemaker transmission.   

## 2019-03-21 ENCOUNTER — Inpatient Hospital Stay: Payer: Medicare HMO

## 2019-03-21 ENCOUNTER — Telehealth: Payer: Self-pay | Admitting: Pharmacist

## 2019-03-21 ENCOUNTER — Other Ambulatory Visit: Payer: Self-pay

## 2019-03-21 ENCOUNTER — Inpatient Hospital Stay (HOSPITAL_BASED_OUTPATIENT_CLINIC_OR_DEPARTMENT_OTHER): Payer: Medicare HMO | Admitting: Internal Medicine

## 2019-03-21 DIAGNOSIS — E860 Dehydration: Secondary | ICD-10-CM

## 2019-03-21 DIAGNOSIS — C9110 Chronic lymphocytic leukemia of B-cell type not having achieved remission: Secondary | ICD-10-CM

## 2019-03-21 DIAGNOSIS — Z5112 Encounter for antineoplastic immunotherapy: Secondary | ICD-10-CM | POA: Diagnosis not present

## 2019-03-21 LAB — COMPREHENSIVE METABOLIC PANEL
ALT: 26 U/L (ref 0–44)
AST: 17 U/L (ref 15–41)
Albumin: 3.5 g/dL (ref 3.5–5.0)
Alkaline Phosphatase: 93 U/L (ref 38–126)
Anion gap: 7 (ref 5–15)
BUN: 18 mg/dL (ref 8–23)
CO2: 24 mmol/L (ref 22–32)
Calcium: 8.5 mg/dL — ABNORMAL LOW (ref 8.9–10.3)
Chloride: 107 mmol/L (ref 98–111)
Creatinine, Ser: 0.79 mg/dL (ref 0.61–1.24)
GFR calc Af Amer: >60 mL/min (ref >60)
GFR calc non Af Amer: >60 mL/min (ref >60)
Glucose, Bld: 158 mg/dL — ABNORMAL HIGH (ref 70–99)
Potassium: 3.9 mmol/L (ref 3.5–5.1)
Sodium: 138 mmol/L (ref 135–145)
Total Bilirubin: 1.1 mg/dL (ref 0.3–1.2)
Total Protein: 6.1 g/dL — ABNORMAL LOW (ref 6.5–8.1)

## 2019-03-21 LAB — CBC WITH DIFFERENTIAL/PLATELET
Abs Immature Granulocytes: 0.09 10*3/uL — ABNORMAL HIGH (ref 0.00–0.07)
Basophils Absolute: 0 10*3/uL (ref 0.0–0.1)
Basophils Relative: 0 %
Eosinophils Absolute: 0 10*3/uL (ref 0.0–0.5)
Eosinophils Relative: 0 %
HCT: 33.6 % — ABNORMAL LOW (ref 39.0–52.0)
Hemoglobin: 11 g/dL — ABNORMAL LOW (ref 13.0–17.0)
Immature Granulocytes: 1 %
Lymphocytes Relative: 32 %
Lymphs Abs: 3.3 10*3/uL (ref 0.7–4.0)
MCH: 32.7 pg (ref 26.0–34.0)
MCHC: 32.7 g/dL (ref 30.0–36.0)
MCV: 100 fL (ref 80.0–100.0)
Monocytes Absolute: 0.8 10*3/uL (ref 0.1–1.0)
Monocytes Relative: 8 %
Neutro Abs: 6.1 10*3/uL (ref 1.7–7.7)
Neutrophils Relative %: 59 %
Platelets: 173 10*3/uL (ref 150–400)
RBC: 3.36 MIL/uL — ABNORMAL LOW (ref 4.22–5.81)
RDW: 15.9 % — ABNORMAL HIGH (ref 11.5–15.5)
WBC: 10.3 10*3/uL (ref 4.0–10.5)
nRBC: 0 % (ref 0.0–0.2)

## 2019-03-21 LAB — BASIC METABOLIC PANEL
Anion gap: 7 (ref 5–15)
BUN: 15 mg/dL (ref 8–23)
CO2: 24 mmol/L (ref 22–32)
Calcium: 8.3 mg/dL — ABNORMAL LOW (ref 8.9–10.3)
Chloride: 108 mmol/L (ref 98–111)
Creatinine, Ser: 0.81 mg/dL (ref 0.61–1.24)
GFR calc Af Amer: >60 mL/min (ref >60)
GFR calc non Af Amer: >60 mL/min (ref >60)
Glucose, Bld: 260 mg/dL — ABNORMAL HIGH (ref 70–99)
Potassium: 3.9 mmol/L (ref 3.5–5.1)
Sodium: 139 mmol/L (ref 135–145)

## 2019-03-21 LAB — LACTATE DEHYDROGENASE
LDH: 152 U/L (ref 98–192)
LDH: 158 U/L (ref 98–192)

## 2019-03-21 LAB — URIC ACID
Uric Acid, Serum: 2.1 mg/dL — ABNORMAL LOW (ref 3.7–8.6)
Uric Acid, Serum: 2.1 mg/dL — ABNORMAL LOW (ref 3.7–8.6)

## 2019-03-21 LAB — PHOSPHORUS
Phosphorus: 3.1 mg/dL (ref 2.5–4.6)
Phosphorus: 2.7 mg/dL (ref 2.5–4.6)

## 2019-03-21 LAB — MAGNESIUM: Magnesium: 2.3 mg/dL (ref 1.7–2.4)

## 2019-03-21 MED ORDER — SODIUM CHLORIDE 0.9 % IV SOLN
Freq: Once | INTRAVENOUS | Status: AC
  Administered 2019-03-21: 10:00:00 via INTRAVENOUS
  Filled 2019-03-21: qty 250

## 2019-03-21 NOTE — Assessment & Plan Note (Signed)
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 Currently s/p 3 cycles of Gazyva in 3 weeks.   # Proceed Venatoclax D-8; 20 mg/day x7 [sec to amiodarone]; today white count is 10. hemoglobin 11 platelets- 173.  Hold Gazyva next week because of infusion chest restriction; will plan on September 14.  # Spontaneous bruising-on Eliquis. Stable.     # A.fib-on amiodarone sinus rhythm-on Eliquis-STABLE.    # TLS/infectious Prophylaxis-allopurinol; Bactrim/acyclovir; IV fluids today over 2 hours  DISPOSITION: # IVFs today # TODAY at 2:00 bmp/ldh/phos/uric acid  # follow up on 09/08- MD- labs- cbc/cmp/ldh/mag phos/uric acid; IV Fluids over 2 hours-  Dr.B

## 2019-03-21 NOTE — Progress Notes (Signed)
Patient Care Team: Leone Haven, MD as PCP - General (Family Medicine) [name], MD as Consulting Physician (Cardiology) Cammie Sickle, MD as Medical Oncologist (Hematology and Oncology)   HPI  [name] is a 73 y.o. male Seen in follow-up for atrial fibrillation managed with ELIQUIS and amiodarone. He was seen in August 2017 with significant sinus bradycardia and underwent pacing received an MRI compatible device He also has coronary artery disease with prior bypass surgery    He has CLL  w recurrence  Followed by Heme and on therapy   Date Cr Hgb TSH LFTs  7/18 0.99 13.2 1.15(4/18) 29   6/19 1.08 11.8  15  1/20 0.96 11.8  22  8/20 0.7 11.0  17      DATE TEST    2/17    Myoview   EF 50 % No ischemia  4/17    Echo   EF 60 %        Patient denies symptoms of GI intolerance, sun sensitivity, neurological symptoms attributable to amiodarone.  Surveillance laboratories were in normal limits when checked X TSH not done  The patient denies chest pain  nocturnal dyspnea, orthopnea. Some Edema, and this assoc with worsening dyspnea.  Chronic DOE  No palpitations, lightheadedness or syncope.    His wife is upstairs now having biopsy for bladder bleeding ? Cancer     Past Medical History:  Diagnosis Date  . Anxiety   . Arthritis   . Atrial fibrillation ([place])    a. Dx 2013, recurred 02/2014, CHA2DS2VASc = 3 -->placed on Eliquis;  b. 02/2014 Echo: EF 50-55%, mid and apical anterior septum and mid and apical inf septum are abnl, mild to mod Ao sclerosis w/o AS.  Marland Kitchen Chicken pox   . Chronic lymphocytic leukemia ([place])    a. Dx 02/2014.  Marland Kitchen Complication of anesthesia    History of  PTSD--do not touch patient when waking up from surgery.  Marland Kitchen COPD (chronic obstructive pulmonary disease) ([place])   . Coronary artery disease    a. 04/2009 CABG x 3 (LIMA->LAD, VG->OM1, VG->PDA);  b. 09/2009 Cath: occluded VG x 2 w/ patent LIMA and L->R collats. EF 55%, mild  antlat HK;  c. 10/2011 MV: EF 53%, no isch/infarct-->low risk.  Marland Kitchen Dysrhythmia    hx of a-fib  . GERD (gastroesophageal reflux disease)    occasional  . History of chemotherapy 2015-2016  . HOH (hard of hearing)    Bilateral Hearing Aids  . Hypertension   . Myocardial infarction ([place]) 2010  . OSA on CPAP    USE C-PAP  . Presence of permanent cardiac pacemaker 2017  . PTSD (post-traumatic stress disorder)   . PTSD (post-traumatic stress disorder)   . Pure hypercholesterolemia   . Rheumatic fever 1959  . 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 ABDOMINAL HERNIA REPAIR    . APPENDECTOMY  06/21/1985  . CARDIAC CATHETERIZATION  2010; 2011   ; Dr Fletcher Anon  . CORONARY ARTERY BYPASS GRAFT  04/2009   "CABG X3"  . EP IMPLANTABLE DEVICE N/A 03/03/2016   Procedure: Pacemaker Implant;  Surgeon: Deboraha Sprang, MD;  Location: [place] [facility];  Service: Cardiovascular;  Laterality: N/A;  . FOREIGN BODY REMOVAL  1968   "shrapnel in my tailbone"  . INGUINAL HERNIA REPAIR Right   . INSERT / REPLACE / REMOVE PACEMAKER    .  JOINT REPLACEMENT Right 2018  . LAPAROSCOPIC CHOLECYSTECTOMY    . TONSILLECTOMY AND ADENOIDECTOMY  1956  . TOTAL HIP ARTHROPLASTY Right 10/22/2016   Procedure: TOTAL HIP ARTHROPLASTY;  Surgeon: Dereck Leep, MD;  Location: [facility];  Service: Orthopedics;  Laterality: Right;  . TOTAL HIP ARTHROPLASTY Left 11/04/2017   Procedure: TOTAL HIP ARTHROPLASTY;  Surgeon: Dereck Leep, MD;  Location: [facility];  Service: Orthopedics;  Laterality: Left;    Current Outpatient Medications  Medication Sig Dispense Refill  . acetaminophen (TYLENOL) 500 MG tablet Take 1,000 mg by mouth every 8 (eight) hours as needed for mild pain.    Marland Kitchen acyclovir (ZOVIRAX) 400 MG tablet One pill a day [to prevent shingles] 30 tablet 3  . albuterol (PROVENTIL HFA;VENTOLIN HFA) 108 (90 Base) MCG/ACT inhaler Inhale 2 puffs into the lungs  every 6 (six) hours as needed for wheezing or shortness of breath. 1 Inhaler 11  . allopurinol (ZYLOPRIM) 300 MG tablet Take 1 tablet by mouth twice daily 120 tablet 3  . amiodarone (PACERONE) 200 MG tablet TAKE 1/2 TABLET (100 mg)  BY MOUTH TWICE DAILY 90 tablet 2  . apixaban (ELIQUIS) 5 MG TABS tablet Take 5 mg by mouth 2 (two) times daily.    Marland Kitchen atorvastatin (LIPITOR) 80 MG tablet Take 1 tablet (80 mg total) by mouth at bedtime. 90 tablet 3  . budesonide-formoterol (SYMBICORT) 160-4.5 MCG/ACT inhaler Inhale 2 puffs into the lungs 2 (two) times daily. 1 Inhaler 11  . cetirizine (ZYRTEC) 10 MG tablet Take 10 mg by mouth daily as needed for allergies.     . Coenzyme Q10 (COQ10) 200 MG CAPS Take 200 mg by mouth daily.    Marland Kitchen dexamethasone (DECADRON) 4 MG tablet Start 2 days prior to infusion; Take for 2 days. Do not take on the day of infusion. 60 tablet 3  . ezetimibe (ZETIA) 10 MG tablet Take 1 tablet (10 mg total) by mouth daily. 90 tablet 3  . Krill Oil 350 MG CAPS Take 350 mg by mouth every evening.    . metoprolol tartrate (LOPRESSOR) 25 MG tablet Take 25 mg by mouth 2 (two) times daily.    . mirtazapine (REMERON) 15 MG tablet Take 15 mg by mouth at bedtime as needed (for panic associated with PTSD).     Marland Kitchen montelukast (SINGULAIR) 10 MG tablet Take 1 tablet (10 mg total) by mouth at bedtime. Start 2 days prior to infusion. Take it for 4 days. 60 tablet 0  . Multiple Vitamin (MULTIVITAMIN WITH MINERALS) TABS tablet Take 1 tablet by mouth daily.    . mupirocin ointment (BACTROBAN) 2 % Apply 1 application topically 3 (three) times daily. 30 g 1  . nitroGLYCERIN (NITROSTAT) 0.4 MG SL tablet Place 1 tablet (0.4 mg total) under the tongue every 5 (five) minutes as needed for chest pain. 25 tablet 6  . tiotropium (SPIRIVA) 18 MCG inhalation capsule Place 1 capsule (18 mcg total) into inhaler and inhale at bedtime. 30 capsule 11  . traMADol (ULTRAM) 50 MG tablet TAKE 1 TABLET BY MOUTH EVERY 12 HOURS AS  NEEDED FOR MODERATE PAIN 90 tablet 0  . venetoclax 10 MG TABS Take 10 mg by mouth daily. Take as directed. 14 tablet 0   No current facility-administered medications for this visit.     No Known Allergies    Review of Systems negative except from HPI and PMH  Physical Exam BP (!) 150/80 (BP Location: Left Arm, Patient Position: Sitting, Cuff Size:  Normal)   Pulse 66   Ht 5\' 9"  (1.753 m)   Wt 227 lb (103 kg)   SpO2 98%   BMI 33.52 kg/m  Well developed and well nourished in no acute distress HENT normal Neck supple with JVP-flat Clear Device pocket well healed; without hematoma or erythema.  There is no tethering  Regular rate and rhythm, no   gallop No  /  murmur Abd-soft with active BS No Clubbing cyanosis  edema Skin-warm and dry A & Oriented  Grossly normal sensory and motor function  ECG A paced @ 66 24/09/42   Assessment and  Plan  Sinus node dysfunction With chronotropic incompetence  Paroxysmal atrial fibrillation  CAD s/p CABG  High Risk Medication Surveillance - Amiodarone  Dyspnea on exertion  Pacemaker-[employer]  Hypertension  CLL recurring   On Anticoagulation;  No bleeding issues   Tolerating amio need to check TSH  No intercurrent atrial fibrillation or flutter  BP elevated this has been in the past assoc with immunotherapy for his CLL   Will defer to oncology     Current medicines are reviewed at length with the patient today .  The patient does not  have concerns regarding medicines.

## 2019-03-21 NOTE — Telephone Encounter (Signed)
Oral Chemotherapy Pharmacist Encounter   Met with [name] in clinic and provided him the the Venetoclax starter kit. He will take the kit home with him to continue the dose escalation. He states he had not problems with his first week of venetoclax. Reviewed his labs and they all look good.  Darl Pikes, PharmD, BCPS, [facility] Hematology/Oncology Clinical Pharmacist [facility]/HP/AP [facility] [phone]  03/21/2019 4:40 PM

## 2019-03-21 NOTE — Progress Notes (Signed)
[place] OFFICE PROGRESS NOTE  Patient Care Team: Leone Haven, MD as PCP - General (Family Medicine) [name], MD as Consulting Physician (Cardiology) Cammie Sickle, MD as Medical Oncologist (Hematology and Oncology)  Cancer Staging No matching staging information was found for the patient.   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  MARCH 2017- CT N/C/A/P- NED. STOP Ritux; surveillance   # AUG 2019- CT/PET- progression/NO transformation;  # NOV 2019- Progression; started Ibrutinib 420 mg/d. STOPPED in end of feb sec to extreme fatigue/joint pains/cramps  #November 01, 2018-start ibrutinib 280 mg a day; December 20, 2018-discontinue ibrutinib secondary multiple side effects.   #January 03, 2019 start GOwenton[July]    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; IGVH- UN-MUTATED [poor prognosis]  DIAGNOSIS: CLL  STAGE: IV  ;GOALS: control  CURRENT/MOST RECENT THERAPY : Gazyva + venetoclax [pending]    C[place]ocytic [place]) [place]  01/03/2019 -  Chemotherapy   The patient had obinutuzumab (GAZYVA) 100 mg in sodium chloride 0.9 % 100 mL (0.9615 mg/mL) chemo infusion, 100 mg, Intravenous, Once, 3 of 6 cycles Administration: 100 mg (01/03/2019), 900 mg (01/04/2019), 1,000 mg (01/10/2019), 1,000 mg (01/31/2019), 1,000 mg (01/17/2019), 1,000 mg (02/28/2019)  for chemotherapy treatment.      INTERVAL HISTORY:  Michael PASTERNAK89y.o.  male CLL-high risk currently on GDyann Kiefis here for follow-up; currently status post 3 cycles is here for follow-up.  Patient started venetoclax ramp-up 10 mg a day last week.  Denies any nausea  vomiting.  Appetite is good with no weight loss.  Denies any chest pain.  No cough.  He is getting physical therapy for his gait instability joint pains improved  Review of Systems  Constitutional: Positive for malaise/fatigue. Negative for chills, diaphoresis and fever.  HENT: Negative for nosebleeds and sore throat.   Eyes: Negative for double vision.  Respiratory: Negative for cough, hemoptysis, sputum production, shortness of breath and wheezing.   Cardiovascular: Negative for chest pain, palpitations, orthopnea and leg swelling.  Gastrointestinal: Negative for abdominal pain, blood in stool, constipation, diarrhea, heartburn, melena, nausea and vomiting.  Genitourinary: Negative for dysuria, frequency and urgency.  Musculoskeletal: Positive for back pain and joint pain.  Skin: Negative.  Negative for itching and rash.  Neurological: Negative for dizziness, tingling, focal weakness and headaches.  Psychiatric/Behavioral: Negative for depression. The patient is not nervous/anxious and does not have insomnia.       PAST MEDICAL HISTORY :  Past Medical History:  Diagnosis Date  . Anxiety   . Arthritis   . Atrial fibrillation ([place]    a. Dx 2013, recurred 02/2014, CHA2DS2VASc = 3 -->placed on Eliquis;  b. 02/2014 Echo: EF 50-55%, mid and apical anterior septum and mid and apical inf septum are abnl, mild to mod Ao sclerosis w/o AS.  .Marland KitchenChicken pox   . [place] lymphocytic [place] ([place]    a. Dx 02/2014.  .Marland KitchenComplication of anesthesia    History of  PTSD--do not touch patient when waking up from surgery.  .Marland KitchenCOPD (chronic obstructive pulmonary disease) ([place]   . Coronary artery disease    a. 04/2009 CABG x 3 (  LIMA->LAD, VG->OM1, VG->PDA);  b. 09/2009 Cath: occluded VG x 2 w/ patent LIMA and L->R collats. EF 55%, mild antlat HK;  c. 10/2011 MV: EF 53%, no isch/infarct-->low risk.  Marland Kitchen Dysrhythmia    hx of a-fib  . GERD (gastroesophageal reflux disease)    occasional  . History of chemotherapy  2015-2016  . HOH (hard of hearing)    Bilateral Hearing Aids  . Hypertension   . Myocardial infarction ([place]) 2010  . OSA on CPAP    USE C-PAP  . Presence of permanent cardiac pacemaker 2017  . PTSD (post-traumatic stress disorder)   . PTSD (post-traumatic stress disorder)   . Pure hypercholesterolemia   . Rheumatic fever 1959  . 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 ABDOMINAL HERNIA REPAIR    . APPENDECTOMY  06/21/1985  . CARDIAC CATHETERIZATION  2010; 2011   ; Dr Fletcher Anon  . CORONARY ARTERY BYPASS GRAFT  04/2009   "CABG X3"  . EP IMPLANTABLE DEVICE N/A 03/03/2016   Procedure: Pacemaker Implant;  Surgeon: Deboraha Sprang, MD;  Location: [facility];  Service: Cardiovascular;  Laterality: N/A;  . FOREIGN BODY REMOVAL  1968   "shrapnel in my tailbone"  . INGUINAL HERNIA REPAIR Right   . INSERT / REPLACE / REMOVE PACEMAKER    . JOINT REPLACEMENT Right 2018  . LAPAROSCOPIC CHOLECYSTECTOMY    . TONSILLECTOMY AND ADENOIDECTOMY  1956  . TOTAL HIP ARTHROPLASTY Right 10/22/2016   Procedure: TOTAL HIP ARTHROPLASTY;  Surgeon: Dereck Leep, MD;  Location: [facility];  Service: Orthopedics;  Laterality: Right;  . TOTAL HIP ARTHROPLASTY Left 11/04/2017   Procedure: TOTAL HIP ARTHROPLASTY;  Surgeon: Dereck Leep, MD;  Location: [facility] OR[facility];  Service: Orthopedics;  Laterality: Left;    FAMILY HISTORY :   Family History  Problem Relation Age of Onset  . Heart disease Mother   . Heart attack Mother   . Coronary artery disease Other        family history    SOCIAL HISTORY:   Social History   Tobacco Use  . Smoking status: Former Smoker    Packs/day: 1.00    Years: 40.00    Pack years: 40.00    Types: Cigarettes    Quit date: 07/21/2006    Years since quitting: 12.6  . Smokeless tobacco: Never Used  Substance Use Topics  . Alcohol use: Yes    Alcohol/week: 1.0 standard drinks     Types: 1 Standard drinks or equivalent per week    Comment: rarely  . Drug use: No    ALLERGIES:  has No Known Allergies.  MEDICATIONS:  Current Outpatient Medications  Medication Sig Dispense Refill  . acetaminophen (TYLENOL) 500 MG tablet Take 1,000 mg by mouth every 8 (eight) hours as needed for mild pain.    Marland Kitchen acyclovir (ZOVIRAX) 400 MG tablet One pill a day [to prevent shingles] 30 tablet 3  . albuterol (PROVENTIL HFA;VENTOLIN HFA) 108 (90 Base) MCG/ACT inhaler Inhale 2 puffs into the lungs every 6 (six) hours as needed for wheezing or shortness of breath. 1 Inhaler 11  . allopurinol (ZYLOPRIM) 300 MG tablet Take 1 tablet by mouth twice daily 120 tablet 3  . amiodarone (PACERONE) 200 MG tablet TAKE 1/2 TABLET (100 mg)  BY MOUTH TWICE DAILY 90 tablet 2  . apixaban (ELIQUIS) 5 MG TABS tablet Take 5 mg  by mouth 2 (two) times daily.    Marland Kitchen atorvastatin (LIPITOR) 80 MG tablet Take 1 tablet (80 mg total) by mouth at bedtime. 90 tablet 3  . budesonide-formoterol (SYMBICORT) 160-4.5 MCG/ACT inhaler Inhale 2 puffs into the lungs 2 (two) times daily. 1 Inhaler 11  . cetirizine (ZYRTEC) 10 MG tablet Take 10 mg by mouth daily as needed for allergies.     . Coenzyme Q10 (COQ10) 200 MG CAPS Take 200 mg by mouth daily.    Marland Kitchen dexamethasone (DECADRON) 4 MG tablet Start 2 days prior to infusion; Take for 2 days. Do not take on the day of infusion. 60 tablet 3  . ezetimibe (ZETIA) 10 MG tablet Take 1 tablet (10 mg total) by mouth daily. 90 tablet 3  . Krill Oil 350 MG CAPS Take 350 mg by mouth every evening.    . metoprolol tartrate (LOPRESSOR) 25 MG tablet Take 25 mg by mouth 2 (two) times daily.    . mirtazapine (REMERON) 15 MG tablet Take 15 mg by mouth at bedtime as needed (for panic associated with PTSD).     Marland Kitchen montelukast (SINGULAIR) 10 MG tablet Take 1 tablet (10 mg total) by mouth at bedtime. Start 2 days prior to infusion. Take it for 4 days. 60 tablet 0  . Multiple Vitamin (MULTIVITAMIN WITH  MINERALS) TABS tablet Take 1 tablet by mouth daily.    . mupirocin ointment (BACTROBAN) 2 % Apply 1 application topically 3 (three) times daily. 30 g 1  . tiotropium (SPIRIVA) 18 MCG inhalation capsule Place 1 capsule (18 mcg total) into inhaler and inhale at bedtime. 30 capsule 11  . venetoclax 10 MG TABS Take 10 mg by mouth daily. Take as directed. 14 tablet 0  . nitroGLYCERIN (NITROSTAT) 0.4 MG SL tablet Place 1 tablet (0.4 mg total) under the tongue every 5 (five) minutes as needed for chest pain. (Patient not taking: Reported on 03/18/2019) 25 tablet 6  . traMADol (ULTRAM) 50 MG tablet TAKE 1 TABLET BY MOUTH EVERY 12 HOURS AS NEEDED FOR MODERATE PAIN (Patient not taking: Reported on 03/18/2019) 90 tablet 0   No current facility-administered medications for this visit.    Facility-Administered Medications Ordered in Other Visits  Medication Dose Route Frequency Provider Last Rate Last Dose  . 0.9 %  sodium chloride infusion   Intravenous Once Cammie Sickle, MD        PHYSICAL EXAMINATION: ECOG PERFORMANCE STATUS: 1 - Symptomatic but completely ambulatory  BP (!) 164/93   Pulse 72   Temp (!) 97.4 F (36.3 C) (Tympanic)   Resp 20   There were no vitals filed for this visit.  Physical Exam  Constitutional: He is oriented to person, place, and time and well-developed, well-nourished, and in no distress.  Patient is alone.  HENT:  Head: Normocephalic and atraumatic.  Mouth/Throat: Oropharynx is clear and moist. No oropharyngeal exudate.  Eyes: Pupils are equal, round, and reactive to light.  Neck: Normal range of motion. Neck supple.  Cardiovascular: Normal rate and regular rhythm.  Pulmonary/Chest: No respiratory distress. He has no wheezes.  Abdominal: Soft. Bowel sounds are normal. He exhibits no distension and no mass. There is no abdominal tenderness. There is no rebound and no guarding.  Musculoskeletal: Normal range of motion.        General: No tenderness or edema.   Lymphadenopathy:  Resolved lymph nodes in the neck underarms.  Neurological: He is alert and oriented to person, place, and time.  Skin: Skin  is warm.  Psychiatric: Affect normal.       LABORATORY DATA:  I have reviewed the data as listed    Component Value Date/Time   NA 138 03/21/2019 0821   NA 139 10/11/2014 1800   K 3.9 03/21/2019 0821   K 3.3 (L) 10/11/2014 1800   CL 107 03/21/2019 0821   CL 106 10/11/2014 1800   CO2 24 03/21/2019 0821   CO2 27 10/11/2014 1800   GLUCOSE 158 (H) 03/21/2019 0821   GLUCOSE 107 (H) 10/11/2014 1800   BUN 18 03/21/2019 0821   BUN 15 10/11/2014 1800   CREATININE 0.79 03/21/2019 0821   CREATININE 0.89 10/11/2014 1800   CALCIUM 8.5 (L) 03/21/2019 0821   CALCIUM 8.8 (L) 10/11/2014 1800   PROT 6.1 (L) 03/21/2019 0821   PROT 6.7 05/18/2017 1048   PROT 6.4 (L) 10/11/2014 1800   ALBUMIN 3.5 03/21/2019 0821   ALBUMIN 4.3 05/18/2017 1048   ALBUMIN 4.1 10/11/2014 1800   AST 17 03/21/2019 0821   AST 23 10/11/2014 1800   ALT 26 03/21/2019 0821   ALT 22 10/11/2014 1800   ALKPHOS 93 03/21/2019 0821   ALKPHOS 61 10/11/2014 1800   BILITOT 1.1 03/21/2019 0821   BILITOT 0.7 05/18/2017 1048   BILITOT 0.9 10/11/2014 1800   GFRNONAA >60 03/21/2019 0821   GFRNONAA >60 10/11/2014 1800   GFRAA >60 03/21/2019 0821   GFRAA >60 10/11/2014 1800    No results found for: SPEP, UPEP  Lab Results  Component Value Date   WBC 10.3 03/21/2019   NEUTROABS 6.1 03/21/2019   HGB 11.0 (L) 03/21/2019   HCT 33.6 (L) 03/21/2019   MCV 100.0 03/21/2019   PLT 173 03/21/2019      Chemistry      Component Value Date/Time   NA 138 03/21/2019 0821   NA 139 10/11/2014 1800   K 3.9 03/21/2019 0821   K 3.3 (L) 10/11/2014 1800   CL 107 03/21/2019 0821   CL 106 10/11/2014 1800   CO2 24 03/21/2019 0821   CO2 27 10/11/2014 1800   BUN 18 03/21/2019 0821   BUN 15 10/11/2014 1800   CREATININE 0.79 03/21/2019 0821   CREATININE 0.89 10/11/2014 1800      Component  Value Date/Time   CALCIUM 8.5 (L) 03/21/2019 0821   CALCIUM 8.8 (L) 10/11/2014 1800   ALKPHOS 93 03/21/2019 0821   ALKPHOS 61 10/11/2014 1800   AST 17 03/21/2019 0821   AST 23 10/11/2014 1800   ALT 26 03/21/2019 0821   ALT 22 10/11/2014 1800   BILITOT 1.1 03/21/2019 0821   BILITOT 0.7 05/18/2017 1048   BILITOT 0.9 10/11/2014 1800       RADIOGRAPHIC STUDIES: I have personally reviewed the radiological images as listed and agreed with the findings in the report. No results found.   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 Currently s/p 3 cycles of Gazyva in 3 weeks.   # Proceed Venatoclax D-8; 20 mg/day x7 [sec to amiodarone]; today white count is 10. hemoglobin 11 platelets- 173.  Hold Gazyva next week because of infusion chest restriction; will plan on September 14.  # Spontaneous bruising-on Eliquis. Stable.     # A.fib-on amiodarone sinus rhythm-on Eliquis-STABLE.    # TLS/infectious Prophylaxis-allopurinol; Bactrim/acyclovir; IV fluids today over 2 hours  DISPOSITION: # IVFs today # TODAY at 2:00 bmp/ldh/phos/uric acid  # follow up on 09/08-  MD- labs- cbc/cmp/ldh/mag phos/uric acid; IV Fluids over 2 hours-  Dr.B     No orders of the defined types were placed in this encounter.  All questions were answered. The patient knows to call the clinic with any problems, questions or concerns.      Cammie Sickle, MD 03/21/2019 9:17 AM

## 2019-03-21 NOTE — Addendum Note (Signed)
Addended by: Sandria Bales B on: 03/21/2019 02:10 PM   Modules accepted: Orders

## 2019-03-22 ENCOUNTER — Ambulatory Visit (INDEPENDENT_AMBULATORY_CARE_PROVIDER_SITE_OTHER): Payer: Medicare HMO | Admitting: Internal Medicine

## 2019-03-22 ENCOUNTER — Encounter: Payer: Self-pay | Admitting: Internal Medicine

## 2019-03-22 VITALS — BP 150/80 | HR 66 | Ht 69.0 in | Wt 227.0 lb

## 2019-03-22 DIAGNOSIS — Z950 Presence of cardiac pacemaker: Secondary | ICD-10-CM | POA: Diagnosis not present

## 2019-03-22 DIAGNOSIS — I495 Sick sinus syndrome: Secondary | ICD-10-CM

## 2019-03-22 DIAGNOSIS — I480 Paroxysmal atrial fibrillation: Secondary | ICD-10-CM

## 2019-03-22 DIAGNOSIS — Z79899 Other long term (current) drug therapy: Secondary | ICD-10-CM

## 2019-03-22 NOTE — Patient Instructions (Addendum)
Medication Instructions:  - Your physician has recommended you make the following change in your medication:   1) Lasix (furosemide) 20 mg- take 1 tablet by mouth once daily x 3 days, then resume taking this as needed   If you need a refill on your cardiac medications before your next appointment, please call your pharmacy.   Lab work: - Your physician recommends that you have lab work with your next draw at the [facility]:  TSH (lab order given)  If you have labs (blood work) drawn today and your tests are completely normal, you will receive your results only by: Marland Kitchen MyChart Message (if you have MyChart) OR . A paper copy in the mail If you have any lab test that is abnormal or we need to change your treatment, we will call you to review the results.  Testing/Procedures: - none ordered  Follow-Up: At [facility], you and your health needs are our priority.  As part of our continuing mission to provide you with exceptional heart care, we have created designated Provider Care Teams.  These Care Teams include your primary Cardiologist (physician) and Advanced Practice Providers (APPs -  Physician Assistants and Nurse Practitioners) who all work together to provide you with the care you need, when you need it. . You will need a follow up appointment in 6 months (March 2021) with Dr. Caryl Comes.  Marland Kitchen Please call our office 2 months in advance to schedule this appointment.  (Call in early January 2021 to schedule)  Remote monitoring is used to monitor your Pacemaker of ICD from home. This monitoring reduces the number of office visits required to check your device to one time per year. It allows [place] to keep an eye on the functioning of your device to ensure it is working properly. You are scheduled for a device check from home on 06/10/19. You may send your transmission at any time that day. If you have a wireless device, the transmission will be sent automatically. After your physician reviews your  transmission, you will receive a postcard with your next transmission date.  Any Other Special Instructions Will Be Listed Below (If Applicable). - N/A

## 2019-03-23 ENCOUNTER — Ambulatory Visit: Payer: Medicare HMO | Attending: Internal Medicine | Admitting: Physical Therapy

## 2019-03-23 ENCOUNTER — Other Ambulatory Visit: Payer: Self-pay

## 2019-03-23 DIAGNOSIS — M6281 Muscle weakness (generalized): Secondary | ICD-10-CM | POA: Insufficient documentation

## 2019-03-23 DIAGNOSIS — R262 Difficulty in walking, not elsewhere classified: Secondary | ICD-10-CM

## 2019-03-23 NOTE — Therapy (Signed)
[place] MAIN [facility] SERVICES [address] [place], [place], [address] Phone: [phone]   Fax:  [phone]  Physical Therapy Treatment  Patient Details  Name: [name] MRN: [id number] Date of Birth: 1946-03-13 Referring Provider (PT): Cindra Presume   Encounter Date: 03/23/2019  PT End of Session - 03/23/19 1556    Visit Number  3    Number of Visits  17    Date for PT Re-Evaluation  05/03/19    PT Start Time  0350    PT Stop Time  0430    PT Time Calculation (min)  40 min    Equipment Utilized During Treatment  Gait belt    Activity Tolerance  Patient tolerated treatment well    Behavior During Therapy  [facility] for tasks assessed/performed       Past Medical History:  Diagnosis Date  . Anxiety   . Arthritis   . Atrial fibrillation ([place])    a. Dx 2013, recurred 02/2014, CHA2DS2VASc = 3 -->placed on Eliquis;  b. 02/2014 Echo: EF 50-55%, mid and apical anterior septum and mid and apical inf septum are abnl, mild to mod Ao sclerosis w/o AS.  Marland Kitchen Chicken pox   . Chronic lymphocytic leukemia ([place])    a. Dx 02/2014.  Marland Kitchen Complication of anesthesia    History of  PTSD--do not touch patient when waking up from surgery.  Marland Kitchen COPD (chronic obstructive pulmonary disease) ([place])   . Coronary artery disease    a. 04/2009 CABG x 3 (LIMA->LAD, VG->OM1, VG->PDA);  b. 09/2009 Cath: occluded VG x 2 w/ patent LIMA and L->R collats. EF 55%, mild antlat HK;  c. 10/2011 MV: EF 53%, no isch/infarct-->low risk.  Marland Kitchen Dysrhythmia    hx of a-fib  . GERD (gastroesophageal reflux disease)    occasional  . History of chemotherapy 2015-2016  . HOH (hard of hearing)    Bilateral Hearing Aids  . Hypertension   . Myocardial infarction ([place]) 2010  . OSA on CPAP    USE C-PAP  . Presence of permanent cardiac pacemaker 2017  . PTSD (post-traumatic stress disorder)   . PTSD (post-traumatic stress disorder)   . Pure hypercholesterolemia   . Rheumatic fever 1959  .  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 ABDOMINAL HERNIA REPAIR    . APPENDECTOMY  06/21/1985  . CARDIAC CATHETERIZATION  2010; 2011   ; Dr Fletcher Anon  . CORONARY ARTERY BYPASS GRAFT  04/2009   "CABG X3"  . EP IMPLANTABLE DEVICE N/A 03/03/2016   Procedure: Pacemaker Implant;  Surgeon: Deboraha Sprang, MD;  Location: [facility];  Service: Cardiovascular;  Laterality: N/A;  . FOREIGN BODY REMOVAL  1968   "shrapnel in my tailbone"  . INGUINAL HERNIA REPAIR Right   . INSERT / REPLACE / REMOVE PACEMAKER    . JOINT REPLACEMENT Right 2018  . LAPAROSCOPIC CHOLECYSTECTOMY    . TONSILLECTOMY AND ADENOIDECTOMY  1956  . TOTAL HIP ARTHROPLASTY Right 10/22/2016   Procedure: TOTAL HIP ARTHROPLASTY;  Surgeon: Dereck Leep, MD;  Location: [facility] ORS;  Service: Orthopedics;  Laterality: Right;  . TOTAL HIP ARTHROPLASTY Left 11/04/2017   Procedure: TOTAL HIP ARTHROPLASTY;  Surgeon: Dereck Leep, MD;  Location: [facility] ORS;  Service: Orthopedics;  Laterality: Left;    There were no vitals filed for this visit.  Subjective Assessment - 03/23/19 1555    Subjective  Patient says he did not get a lot of sleep last night. Patient has one daughter in the hospital and another one is very sick at home.    Pertinent History  Patient is having muscle pain in BLE and calfs. He is having pain that comes and goes but never goes away all the way. He is having balance difficulty and he is stumbling. He is on a blood thinner and had abdominal anersum.    Currently in Pain?  Yes    Pain Score  10-Worst pain ever    Pain Location  Leg    Pain Orientation  Right;Left    Pain Descriptors / Indicators  Aching         Treatment: Nu-step  x 5 mins L 2 Leg press 100 lbs x 20 x 2, heel raises x 20 x 2  hooklying marching with head elevated 40 degrees x 10  SLR x 10 x 2 BLE, head elevated 40 deg Hip abd supine head elevated 40 degrees  x 10 x 2  SAQ  with head elevated 40 deg and 5 sec hold x 10 BLE  Squats x 10  Standing marching x 20 BLE Lunge on BOSU ball x 15 x 2 BLE Standing hip extension x 10 , BLE Standing hip abd x 10 , BLE Sit to stand x 10  Standing heel raises x 10    Pt educated throughout session about proper posture and technique with exercises. Improved exercise technique, movement at target joints, use of target muscles after min to mod verbal, visual, tactile cues.                       PT Education - 03/23/19 1556    Education Details  HEP    Person(s) Educated  Patient    Methods  Explanation    Comprehension  Verbalized understanding;Returned demonstration       PT Short Term Goals - 03/08/19 1806      PT SHORT TERM GOAL #1   Title  Patient will be independent in home exercise program to improve strength/mobility for better functional independence with ADLs.    Time  4    Period  Weeks    Status  New    Target Date  04/05/19      PT SHORT TERM GOAL #2   Title  Patient (< 74 years old) will complete five times sit to stand test in < 10 seconds indicating an increased LE strength and improved balance.    Time  4    Period  Weeks    Status  New    Target Date  04/05/19        PT Long Term Goals - 03/08/19 1809      PT LONG TERM GOAL #1   Title  Patient (> 5 years old) will complete five times sit to stand test in < 15 seconds indicating an increased LE strength and improved balance.    Time  8    Period  Weeks    Status  New    Target Date  05/03/19      PT LONG TERM GOAL #2   Title  Patient will increase Berg Balance score by > 6 points to demonstrate decreased fall risk during functional activities.    Time  8    Period  Weeks    Status  New    Target Date  05/03/19      PT  LONG TERM GOAL #3   Title  Patient will increase 10 meter walk test to >1.29m/s as to improve gait speed for better community ambulation and to reduce fall risk.    Time  8    Period  Weeks     Status  New    Target Date  05/03/19            Plan - 03/23/19 1557    Clinical Impression Statement  Patient has weakness in BLE and is taking chemo therapy. He ambulates with spc and has slow gait speed. He performs open chain and closed chain exercises and has high pain prior to therapy and following therapy. He will continue to benefit from skilled PT to imrpove mobilty and strength.    Personal Factors and Comorbidities  Age;Comorbidity 1    Comorbidities  COPD,    Examination-Participation Restrictions  Yard Work;Cleaning;Community Activity    Stability/Clinical Decision Making  Evolving/Moderate complexity    Rehab Potential  Fair    PT Frequency  2x / week    PT Duration  8 weeks    PT Treatment/Interventions  Gait training;Therapeutic activities;Balance training;Therapeutic exercise;Patient/family education    PT Next Visit Plan  strengthening and balance    PT Home Exercise Plan  heel raises, hip abd, hip ext YTB    Consulted and Agree with Plan of Care  Patient       Patient will benefit from skilled therapeutic intervention in order to improve the following deficits and impairments:  Abnormal gait, Decreased balance, Decreased mobility, Difficulty walking, Cardiopulmonary status limiting activity, Decreased activity tolerance, Decreased strength, Pain  Visit Diagnosis: Difficulty in walking, not elsewhere classified  Muscle weakness (generalized)     Problem List Patient Active Problem List   Diagnosis Date Noted  . Dehydration 03/14/2019  . Gross hematuria 05/12/2018  . Sick sinus syndrome ([place]) 07/08/2016  . Goals of care, counseling/discussion 07/08/2016  . 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 Chronic fatigue   . 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 PAD (peripheral artery disease) ([place])   . OSA on CPAP   . TIA (transient ischemic attack) 11/02/2015  . Chronic diastolic CHF (congestive heart failure) ([place])  09/20/2015  . CLL (chronic lymphocytic leukemia) ([place])   . PTSD (post-traumatic stress disorder)   . Osteoarthritis of both knees 07/05/2015  . COPD (chronic obstructive pulmonary disease) ([place]) 01/01/2012  . Shortness of breath 10/09/2011  . Paroxysmal atrial fibrillation ([place]) 10/09/2011  . Hyperlipidemia 11/28/2009  . Essential hypertension 11/28/2009    Alanson Puls, PT DPT 03/23/2019, 4:01 PM  [place] MAIN [facility] SERVICES [address] [place], [place], [address] Phone: [phone]   Fax:  [phone]  Name: [name] MRN: [id number] Date of Birth: May 28, 1946

## 2019-03-25 ENCOUNTER — Encounter: Payer: Self-pay | Admitting: Internal Medicine

## 2019-03-25 ENCOUNTER — Other Ambulatory Visit: Payer: Self-pay

## 2019-03-25 NOTE — Progress Notes (Signed)
Patient denies any concerns today.  

## 2019-03-29 ENCOUNTER — Other Ambulatory Visit: Payer: Self-pay

## 2019-03-29 ENCOUNTER — Inpatient Hospital Stay (HOSPITAL_BASED_OUTPATIENT_CLINIC_OR_DEPARTMENT_OTHER): Payer: Medicare HMO | Admitting: Internal Medicine

## 2019-03-29 ENCOUNTER — Inpatient Hospital Stay: Payer: Medicare HMO

## 2019-03-29 ENCOUNTER — Inpatient Hospital Stay: Payer: Medicare HMO | Attending: Internal Medicine

## 2019-03-29 ENCOUNTER — Telehealth: Payer: Self-pay | Admitting: Pharmacist

## 2019-03-29 DIAGNOSIS — C9110 Chronic lymphocytic leukemia of B-cell type not having achieved remission: Secondary | ICD-10-CM

## 2019-03-29 DIAGNOSIS — R252 Cramp and spasm: Secondary | ICD-10-CM | POA: Diagnosis not present

## 2019-03-29 DIAGNOSIS — R233 Spontaneous ecchymoses: Secondary | ICD-10-CM | POA: Insufficient documentation

## 2019-03-29 DIAGNOSIS — Z79899 Other long term (current) drug therapy: Secondary | ICD-10-CM | POA: Insufficient documentation

## 2019-03-29 DIAGNOSIS — I4891 Unspecified atrial fibrillation: Secondary | ICD-10-CM | POA: Insufficient documentation

## 2019-03-29 DIAGNOSIS — Z7901 Long term (current) use of anticoagulants: Secondary | ICD-10-CM | POA: Diagnosis not present

## 2019-03-29 DIAGNOSIS — Z5112 Encounter for antineoplastic immunotherapy: Secondary | ICD-10-CM | POA: Insufficient documentation

## 2019-03-29 DIAGNOSIS — R269 Unspecified abnormalities of gait and mobility: Secondary | ICD-10-CM | POA: Insufficient documentation

## 2019-03-29 DIAGNOSIS — Z87891 Personal history of nicotine dependence: Secondary | ICD-10-CM | POA: Insufficient documentation

## 2019-03-29 LAB — COMPREHENSIVE METABOLIC PANEL
ALT: 25 U/L (ref 0–44)
AST: 18 U/L (ref 15–41)
Albumin: 3.9 g/dL (ref 3.5–5.0)
Alkaline Phosphatase: 81 U/L (ref 38–126)
Anion gap: 7 (ref 5–15)
BUN: 21 mg/dL (ref 8–23)
CO2: 25 mmol/L (ref 22–32)
Calcium: 8.8 mg/dL — ABNORMAL LOW (ref 8.9–10.3)
Chloride: 109 mmol/L (ref 98–111)
Creatinine, Ser: 0.86 mg/dL (ref 0.61–1.24)
GFR calc Af Amer: >60 mL/min (ref >60)
GFR calc non Af Amer: >60 mL/min (ref >60)
Glucose, Bld: 184 mg/dL — ABNORMAL HIGH (ref 70–99)
Potassium: 4 mmol/L (ref 3.5–5.1)
Sodium: 141 mmol/L (ref 135–145)
Total Bilirubin: 1 mg/dL (ref 0.3–1.2)
Total Protein: 6 g/dL — ABNORMAL LOW (ref 6.5–8.1)

## 2019-03-29 LAB — CBC WITH DIFFERENTIAL/PLATELET
Abs Immature Granulocytes: 0.06 10*3/uL (ref 0.00–0.07)
Basophils Absolute: 0 10*3/uL (ref 0.0–0.1)
Basophils Relative: 0 %
Eosinophils Absolute: 0 10*3/uL (ref 0.0–0.5)
Eosinophils Relative: 0 %
HCT: 34.4 % — ABNORMAL LOW (ref 39.0–52.0)
Hemoglobin: 11.1 g/dL — ABNORMAL LOW (ref 13.0–17.0)
Immature Granulocytes: 1 %
Lymphocytes Relative: 21 %
Lymphs Abs: 2.5 10*3/uL (ref 0.7–4.0)
MCH: 32.6 pg (ref 26.0–34.0)
MCHC: 32.3 g/dL (ref 30.0–36.0)
MCV: 100.9 fL — ABNORMAL HIGH (ref 80.0–100.0)
Monocytes Absolute: 1.2 10*3/uL — ABNORMAL HIGH (ref 0.1–1.0)
Monocytes Relative: 10 %
Neutro Abs: 7.9 10*3/uL — ABNORMAL HIGH (ref 1.7–7.7)
Neutrophils Relative %: 68 %
Platelets: 161 10*3/uL (ref 150–400)
RBC: 3.41 MIL/uL — ABNORMAL LOW (ref 4.22–5.81)
RDW: 16 % — ABNORMAL HIGH (ref 11.5–15.5)
WBC: 11.7 10*3/uL — ABNORMAL HIGH (ref 4.0–10.5)
nRBC: 0 % (ref 0.0–0.2)

## 2019-03-29 LAB — MAGNESIUM: Magnesium: 2.2 mg/dL (ref 1.7–2.4)

## 2019-03-29 LAB — TSH: TSH: 0.677 u[IU]/mL (ref 0.350–4.500)

## 2019-03-29 LAB — URIC ACID: Uric Acid, Serum: 2.2 mg/dL — ABNORMAL LOW (ref 3.7–8.6)

## 2019-03-29 LAB — LACTATE DEHYDROGENASE: LDH: 147 U/L (ref 98–192)

## 2019-03-29 LAB — PHOSPHORUS: Phosphorus: 3.1 mg/dL (ref 2.5–4.6)

## 2019-03-29 NOTE — Telephone Encounter (Signed)
Oral Chemotherapy Pharmacist Encounter   Met with patient following his office visit with Dr. Rogue Bussing. He is going well and following along with his starter kit. Reviewed dosing, he stated his understanding of the plan. Labs reviewed, no current concerns for TLS.   Darl Pikes, PharmD, BCPS, [facility] Hematology/Oncology Clinical Pharmacist [facility] [phone]  03/29/2019 12:15 PM

## 2019-03-29 NOTE — Progress Notes (Signed)
[name] OFFICE PROGRESS NOTE  Patient Care Team: Leone Haven, MD as PCP - General (Family Medicine) [name], MD as Consulting Physician (Cardiology) Cammie Sickle, MD as Medical Oncologist (Hematology and Oncology)  Cancer Staging No matching staging information was found for the patient.   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  MARCH 2017- CT N/C/A/P- NED. STOP Ritux; surveillance   # AUG 2019- CT/PET- progression/NO transformation;  # NOV 2019- Progression; started Ibrutinib 420 mg/d. STOPPED in end of feb sec to extreme fatigue/joint pains/cramps  #November 01, 2018-start ibrutinib 280 mg a day; December 20, 2018-discontinue ibrutinib secondary multiple side effects.   #January 03, 2019 start GEl [place][July]    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; IGVH- UN-MUTATED [poor prognosis]  DIAGNOSIS: CLL  STAGE: IV  ;GOALS: control  CURRENT/MOST RECENT THERAPY : Gazyva + venetoclax [pending]    CLL (chronic lymphocytic leukemia) ([place]  01/03/2019 -  Chemotherapy   The patient had obinutuzumab (GAZYVA) 100 mg in sodium chloride 0.9 % 100 mL (0.9615 mg/mL) chemo infusion, 100 mg, Intravenous, Once, 3 of 6 cycles Administration: 100 mg (01/03/2019), 900 mg (01/04/2019), 1,000 mg (01/10/2019), 1,000 mg (01/31/2019), 1,000 mg (01/17/2019), 1,000 mg (02/28/2019)  for chemotherapy treatment.      INTERVAL HISTORY:  Michael [id number].  male CLL-high risk currently on GDyann Kiefis here for follow-up; currently status post 3 cycles is here for follow-up.  Patient currently on venetoclax modified ramp-up.  He started taking 100 mg once a  day starting yesterday.  No nausea no vomiting.  No fevers or chills.  No chest pain.  No cough.  Gait instability improving.  No falls.  Review of Systems  Constitutional: Positive for malaise/fatigue. Negative for chills, diaphoresis and fever.  HENT: Negative for nosebleeds and sore throat.   Eyes: Negative for double vision.  Respiratory: Negative for cough, hemoptysis, sputum production, shortness of breath and wheezing.   Cardiovascular: Negative for chest pain, palpitations, orthopnea and leg swelling.  Gastrointestinal: Negative for abdominal pain, blood in stool, constipation, diarrhea, heartburn, melena, nausea and vomiting.  Genitourinary: Negative for dysuria, frequency and urgency.  Musculoskeletal: Positive for back pain and joint pain.  Skin: Negative.  Negative for itching and rash.  Neurological: Negative for dizziness, tingling, focal weakness and headaches.  Psychiatric/Behavioral: Negative for depression. The patient is not nervous/anxious and does not have insomnia.       PAST MEDICAL HISTORY :  Past Medical History:  Diagnosis Date  . Anxiety   . Arthritis   . Atrial fibrillation ([place]    a. Dx 2013, recurred 02/2014, CHA2DS2VASc = 3 -->placed on Eliquis;  b. 02/2014 Echo: EF 50-55%, mid and apical anterior septum and mid and apical inf septum are abnl, mild to mod Ao sclerosis w/o AS.  .Marland KitchenChicken pox   . Chronic lymphocytic leukemia ([place]    a. Dx 02/2014.  .Marland KitchenComplication of anesthesia    History of  PTSD--do not touch patient when waking up from surgery.  .Marland KitchenCOPD (chronic obstructive pulmonary disease) ([place]   . Coronary artery disease    a. 04/2009 CABG x 3 (LIMA->LAD, VG->OM1, VG->PDA);  b. 09/2009 Cath: occluded VG x 2 w/ patent LIMA and L->R collats. EF 55%, mild antlat HK;  c. 10/2011 MV: EF 53%, no isch/infarct-->low risk.  Marland Kitchen Dysrhythmia    hx of a-fib  . GERD (gastroesophageal reflux disease)    occasional  . History of chemotherapy 2015-2016  . HOH  (hard of hearing)    Bilateral Hearing Aids  . Hypertension   . Myocardial infarction ([place]) 2010  . OSA on CPAP    USE C-PAP  . Presence of permanent cardiac pacemaker 2017  . PTSD (post-traumatic stress disorder)   . PTSD (post-traumatic stress disorder)   . Pure hypercholesterolemia   . Rheumatic fever 1959  . 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 ABDOMINAL HERNIA REPAIR    . APPENDECTOMY  06/21/1985  . CARDIAC CATHETERIZATION  2010; 2011   ; Dr Fletcher Anon  . CORONARY ARTERY BYPASS GRAFT  04/2009   "CABG X3"  . EP IMPLANTABLE DEVICE N/A 03/03/2016   Procedure: Pacemaker Implant;  Surgeon: Deboraha Sprang, MD;  Location: [place] [facility];  Service: Cardiovascular;  Laterality: N/A;  . FOREIGN BODY REMOVAL  1968   "shrapnel in my tailbone"  . INGUINAL HERNIA REPAIR Right   . INSERT / REPLACE / REMOVE PACEMAKER    . JOINT REPLACEMENT Right 2018  . LAPAROSCOPIC CHOLECYSTECTOMY    . TONSILLECTOMY AND ADENOIDECTOMY  1956  . TOTAL HIP ARTHROPLASTY Right 10/22/2016   Procedure: TOTAL HIP ARTHROPLASTY;  Surgeon: Dereck Leep, MD;  Location: [facility] ORS;  Service: Orthopedics;  Laterality: Right;  . TOTAL HIP ARTHROPLASTY Left 11/04/2017   Procedure: TOTAL HIP ARTHROPLASTY;  Surgeon: Dereck Leep, MD;  Location: [facility] ORS;  Service: Orthopedics;  Laterality: Left;    FAMILY HISTORY :   Family History  Problem Relation Age of Onset  . Heart disease Mother   . Heart attack Mother   . Coronary artery disease Other        family history    SOCIAL HISTORY:   Social History   Tobacco Use  . Smoking status: Former Smoker    Packs/day: 1.00    Years: 40.00    Pack years: 40.00    Types: Cigarettes    Quit date: 07/21/2006    Years since quitting: 12.6  . Smokeless tobacco: Never Used  Substance Use Topics  . Alcohol use: Yes    Alcohol/week: 1.0 standard drinks    Types: 1 Standard  drinks or equivalent per week    Comment: rarely  . Drug use: No    ALLERGIES:  has No Known Allergies.  MEDICATIONS:  Current Outpatient Medications  Medication Sig Dispense Refill  . acetaminophen (TYLENOL) 500 MG tablet Take 1,000 mg by mouth every 8 (eight) hours as needed for mild pain.    Marland Kitchen acyclovir (ZOVIRAX) 400 MG tablet One pill a day [to prevent shingles] 30 tablet 3  . albuterol (PROVENTIL HFA;VENTOLIN HFA) 108 (90 Base) MCG/ACT inhaler Inhale 2 puffs into the lungs every 6 (six) hours as needed for wheezing or shortness of breath. 1 Inhaler 11  . allopurinol (ZYLOPRIM) 300 MG tablet Take 1 tablet by mouth twice daily 120 tablet 3  . amiodarone (PACERONE) 200 MG tablet TAKE 1/2 TABLET (100 mg)  BY MOUTH TWICE DAILY 90 tablet 2  . apixaban (ELIQUIS) 5 MG TABS tablet Take 5 mg by mouth 2 (two)  times daily.    Marland Kitchen atorvastatin (LIPITOR) 80 MG tablet Take 1 tablet (80 mg total) by mouth at bedtime. 90 tablet 3  . budesonide-formoterol (SYMBICORT) 160-4.5 MCG/ACT inhaler Inhale 2 puffs into the lungs 2 (two) times daily. 1 Inhaler 11  . cetirizine (ZYRTEC) 10 MG tablet Take 10 mg by mouth daily as needed for allergies.     . Coenzyme Q10 (COQ10) 200 MG CAPS Take 200 mg by mouth daily.    Marland Kitchen dexamethasone (DECADRON) 4 MG tablet Start 2 days prior to infusion; Take for 2 days. Do not take on the day of infusion. 60 tablet 3  . ezetimibe (ZETIA) 10 MG tablet Take 1 tablet (10 mg total) by mouth daily. 90 tablet 3  . furosemide (LASIX) 20 MG tablet Take 1 tablet (20 mg) by mouth twice daily as needed    . Krill Oil 350 MG CAPS Take 350 mg by mouth every evening.    . metoprolol tartrate (LOPRESSOR) 25 MG tablet Take 25 mg by mouth 2 (two) times daily.    . mirtazapine (REMERON) 15 MG tablet Take 15 mg by mouth at bedtime as needed (for panic associated with PTSD).     Marland Kitchen montelukast (SINGULAIR) 10 MG tablet Take 1 tablet (10 mg total) by mouth at bedtime. Start 2 days prior to infusion. Take  it for 4 days. 60 tablet 0  . Multiple Vitamin (MULTIVITAMIN WITH MINERALS) TABS tablet Take 1 tablet by mouth daily.    . mupirocin ointment (BACTROBAN) 2 % Apply 1 application topically 3 (three) times daily. 30 g 1  . nitroGLYCERIN (NITROSTAT) 0.4 MG SL tablet Place 1 tablet (0.4 mg total) under the tongue every 5 (five) minutes as needed for chest pain. 25 tablet 6  . tiotropium (SPIRIVA) 18 MCG inhalation capsule Place 1 capsule (18 mcg total) into inhaler and inhale at bedtime. 30 capsule 11  . traMADol (ULTRAM) 50 MG tablet TAKE 1 TABLET BY MOUTH EVERY 12 HOURS AS NEEDED FOR MODERATE PAIN 90 tablet 0  . venetoclax 10 & 50 & 100 MG TBPK Take by mouth daily. Dose escalate weekly to goal dose of 256m     No current facility-administered medications for this visit.     PHYSICAL EXAMINATION: ECOG PERFORMANCE STATUS: 1 - Symptomatic but completely ambulatory  BP (!) 160/78 (BP Location: Left Arm, Patient Position: Sitting, Cuff Size: Normal)   Pulse 67   Temp (!) 97.3 F (36.3 C) (Tympanic)   Resp 16   Wt 226 lb 6.4 oz (102.7 kg)   BMI 33.43 kg/m   Filed Weights   03/29/19 1005  Weight: 226 lb 6.4 oz (102.7 kg)    Physical Exam  Constitutional: He is oriented to person, place, and time and well-developed, well-nourished, and in no distress.  Patient is alone.  HENT:  Head: Normocephalic and atraumatic.  Mouth/Throat: Oropharynx is clear and moist. No oropharyngeal exudate.  Eyes: Pupils are equal, round, and reactive to light.  Neck: Normal range of motion. Neck supple.  Cardiovascular: Normal rate and regular rhythm.  Pulmonary/Chest: No respiratory distress. He has no wheezes.  Abdominal: Soft. Bowel sounds are normal. He exhibits no distension and no mass. There is no abdominal tenderness. There is no rebound and no guarding.  Musculoskeletal: Normal range of motion.        General: No tenderness or edema.  Lymphadenopathy:  Resolved lymph nodes in the neck underarms.   Neurological: He is alert and oriented to person, place, and  time.  Skin: Skin is warm.  Psychiatric: Affect normal.       LABORATORY DATA:  I have reviewed the data as listed    Component Value Date/Time   NA 141 03/29/2019 0915   NA 139 10/11/2014 1800   K 4.0 03/29/2019 0915   K 3.3 (L) 10/11/2014 1800   CL 109 03/29/2019 0915   CL 106 10/11/2014 1800   CO2 25 03/29/2019 0915   CO2 27 10/11/2014 1800   GLUCOSE 184 (H) 03/29/2019 0915   GLUCOSE 107 (H) 10/11/2014 1800   BUN 21 03/29/2019 0915   BUN 15 10/11/2014 1800   CREATININE 0.86 03/29/2019 0915   CREATININE 0.89 10/11/2014 1800   CALCIUM 8.8 (L) 03/29/2019 0915   CALCIUM 8.8 (L) 10/11/2014 1800   PROT 6.0 (L) 03/29/2019 0915   PROT 6.7 05/18/2017 1048   PROT 6.4 (L) 10/11/2014 1800   ALBUMIN 3.9 03/29/2019 0915   ALBUMIN 4.3 05/18/2017 1048   ALBUMIN 4.1 10/11/2014 1800   AST 18 03/29/2019 0915   AST 23 10/11/2014 1800   ALT 25 03/29/2019 0915   ALT 22 10/11/2014 1800   ALKPHOS 81 03/29/2019 0915   ALKPHOS 61 10/11/2014 1800   BILITOT 1.0 03/29/2019 0915   BILITOT 0.7 05/18/2017 1048   BILITOT 0.9 10/11/2014 1800   GFRNONAA >60 03/29/2019 0915   GFRNONAA >60 10/11/2014 1800   GFRAA >60 03/29/2019 0915   GFRAA >60 10/11/2014 1800    No results found for: SPEP, UPEP  Lab Results  Component Value Date   WBC 11.7 (H) 03/29/2019   NEUTROABS 7.9 (H) 03/29/2019   HGB 11.1 (L) 03/29/2019   HCT 34.4 (L) 03/29/2019   MCV 100.9 (H) 03/29/2019   PLT 161 03/29/2019      Chemistry      Component Value Date/Time   NA 141 03/29/2019 0915   NA 139 10/11/2014 1800   K 4.0 03/29/2019 0915   K 3.3 (L) 10/11/2014 1800   CL 109 03/29/2019 0915   CL 106 10/11/2014 1800   CO2 25 03/29/2019 0915   CO2 27 10/11/2014 1800   BUN 21 03/29/2019 0915   BUN 15 10/11/2014 1800   CREATININE 0.86 03/29/2019 0915   CREATININE 0.89 10/11/2014 1800      Component Value Date/Time   CALCIUM 8.8 (L) 03/29/2019 0915    CALCIUM 8.8 (L) 10/11/2014 1800   ALKPHOS 81 03/29/2019 0915   ALKPHOS 61 10/11/2014 1800   AST 18 03/29/2019 0915   AST 23 10/11/2014 1800   ALT 25 03/29/2019 0915   ALT 22 10/11/2014 1800   BILITOT 1.0 03/29/2019 0915   BILITOT 0.7 05/18/2017 1048   BILITOT 0.9 10/11/2014 1800       RADIOGRAPHIC STUDIES: I have personally reviewed the radiological images as listed and agreed with the findings in the report. No results found.   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 Currently s/p 3 cycles of Gazyva.   # Proceed Venatoclax D-8; 100 mg/day x7 [sec to amiodarone; starting 9/7]; today white count is 11. hemoglobin 11 platelets- 173. Will plan #4 on 9/14.  No evidence of tumor lysis.  Discussed with pharmacy.  # Spontaneous bruising-on Eliquis. Stable.     # A.fib-on amiodarone sinus rhythm-on Eliquis- stable.   # TLS/infectious Prophylaxis-allopurinol; Bactrim/acyclovir. STABLE.   DISPOSITION: # NO IVFs today # cancel today lab # follow up on 09/14-  MD- labs- cbc/cmp/ldh/mag phos/uric acid; Gazyva infusion-  Dr.B     Orders Placed This Encounter  Procedures  . CBC with Differential    Standing Status:   Future    Standing Expiration Date:   03/28/2020  . Comprehensive metabolic panel    Standing Status:   Future    Standing Expiration Date:   03/28/2020  . Lactate dehydrogenase    Standing Status:   Future    Standing Expiration Date:   03/28/2020  . Magnesium    Standing Status:   Future    Standing Expiration Date:   03/28/2020  . Uric acid    Standing Status:   Future    Standing Expiration Date:   03/28/2020   All questions were answered. The patient knows to call the clinic with any problems, questions or concerns.      Cammie Sickle, MD 03/29/2019 1:12 PM

## 2019-03-29 NOTE — Assessment & Plan Note (Addendum)
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 Currently s/p 3 cycles of Gazyva.   # Proceed Venatoclax D-8; 100 mg/day x7 [sec to amiodarone; starting 9/7]; today white count is 11. hemoglobin 11 platelets- 173. Will plan #4 on 9/14.  No evidence of tumor lysis.  Discussed with pharmacy.  # Spontaneous bruising-on Eliquis. Stable.     # A.fib-on amiodarone sinus rhythm-on Eliquis- stable.   # TLS/infectious Prophylaxis-allopurinol; Bactrim/acyclovir. STABLE.   DISPOSITION: # NO IVFs today # cancel today lab # follow up on 09/14- MD- labs- cbc/cmp/ldh/mag phos/uric acid; Gazyva infusion-  Dr.B

## 2019-03-30 ENCOUNTER — Encounter: Payer: Self-pay | Admitting: Physical Therapy

## 2019-03-30 ENCOUNTER — Ambulatory Visit: Payer: Medicare HMO | Admitting: Physical Therapy

## 2019-03-30 DIAGNOSIS — M6281 Muscle weakness (generalized): Secondary | ICD-10-CM | POA: Diagnosis not present

## 2019-03-30 DIAGNOSIS — R262 Difficulty in walking, not elsewhere classified: Secondary | ICD-10-CM

## 2019-03-30 NOTE — Therapy (Signed)
[place] MAIN [facility] SERVICES [address] [place], [place], [address] Phone: [phone]   Fax:  [phone]  Physical Therapy Treatment  Patient Details  Name: [name] MRN: [id number] Date of Birth: 07/13/1946 Referring Provider (PT): Cindra Presume   Encounter Date: 03/30/2019  PT End of Session - 03/30/19 1503    Visit Number  4    Number of Visits  17    Date for PT Re-Evaluation  05/03/19    PT Start Time  0300    PT Stop Time  0340    PT Time Calculation (min)  40 min    Equipment Utilized During Treatment  Gait belt    Activity Tolerance  Patient tolerated treatment well    Behavior During Therapy  [facility] for tasks assessed/performed       Past Medical History:  Diagnosis Date  . Anxiety   . Arthritis   . Atrial fibrillation ([place])    a. Dx 2013, recurred 02/2014, CHA2DS2VASc = 3 -->placed on Eliquis;  b. 02/2014 Echo: EF 50-55%, mid and apical anterior septum and mid and apical inf septum are abnl, mild to mod Ao sclerosis w/o AS.  Marland Kitchen Chicken pox   . Chronic lymphocytic leukemia ([place])    a. Dx 02/2014.  Marland Kitchen Complication of anesthesia    History of  PTSD--do not touch patient when waking up from surgery.  Marland Kitchen COPD (chronic obstructive pulmonary disease) ([place])   . Coronary artery disease    a. 04/2009 CABG x 3 (LIMA->LAD, VG->OM1, VG->PDA);  b. 09/2009 Cath: occluded VG x 2 w/ patent LIMA and L->R collats. EF 55%, mild antlat HK;  c. 10/2011 MV: EF 53%, no isch/infarct-->low risk.  Marland Kitchen Dysrhythmia    hx of a-fib  . GERD (gastroesophageal reflux disease)    occasional  . History of chemotherapy 2015-2016  . HOH (hard of hearing)    Bilateral Hearing Aids  . Hypertension   . Myocardial infarction ([place]) 2010  . OSA on CPAP    USE C-PAP  . Presence of permanent cardiac pacemaker 2017  . PTSD (post-traumatic stress disorder)   . PTSD (post-traumatic stress disorder)   . Pure hypercholesterolemia   . Rheumatic fever 1959  .  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 ABDOMINAL HERNIA REPAIR    . APPENDECTOMY  06/21/1985  . CARDIAC CATHETERIZATION  2010; 2011   ; Dr Fletcher Anon  . CORONARY ARTERY BYPASS GRAFT  04/2009   "CABG X3"  . EP IMPLANTABLE DEVICE N/A 03/03/2016   Procedure: Pacemaker Implant;  Surgeon: Deboraha Sprang, MD;  Location: [place] CV LAB;  Service: Cardiovascular;  Laterality: N/A;  . FOREIGN BODY REMOVAL  1968   "shrapnel in my tailbone"  . INGUINAL HERNIA REPAIR Right   . INSERT / REPLACE / REMOVE PACEMAKER    . JOINT REPLACEMENT Right 2018  . LAPAROSCOPIC CHOLECYSTECTOMY    . TONSILLECTOMY AND ADENOIDECTOMY  1956  . TOTAL HIP ARTHROPLASTY Right 10/22/2016   Procedure: TOTAL HIP ARTHROPLASTY;  Surgeon: Dereck Leep, MD;  Location: [facility] ORS;  Service: Orthopedics;  Laterality: Right;  . TOTAL HIP ARTHROPLASTY Left 11/04/2017   Procedure: TOTAL HIP ARTHROPLASTY;  Surgeon: Dereck Leep, MD;  Location: [facility] ORS;  Service: Orthopedics;  Laterality: Left;    There were no vitals filed for this visit.  Subjective Assessment - 03/30/19 1559    Subjective  Patient says he did not get a lot of sleep last night. Patient has one daughter in the hospital and another one is very sick at home.    Pertinent History  Patient is having muscle pain in BLE and calfs. He is having pain that comes and goes but never goes away all the way. He is having balance difficulty and he is stumbling. He is on a blood thinner and had abdominal anersum.    Currently in Pain?  No/denies    Pain Score  0-No pain      Treatment: Octane fitness x 5 mins L 2   Standing on blue foam with feet apart, feet together, tapping left and right from foam, tapping 4 inch surface , head turns left and right with CGA and minimal UE assist   Stepping over blue 1/2 foam fwd/bwd, side to side with intermittent rail assist and cGA x 10   Stepping over hurdle fwd/bwd,  side to side with UE assist x 10   Standing on 1/2 foam feet apart flat side up and flat side down and minimal UE assist  X 1 min x 2 sets  Standing on foam and step ups to 4 inch stool x 10  lateral step ups x 5  , no UE assist and cGA,   Rocker board fwd/bwd, side to side taps and trying to keep it balanced without UE support and cGA  Patient needs VC and TC for safety and correct technique for lateral stepping far enough to each side . Patient has posterior LOB intermittently during treatment and no rest periods                         PT Education - 03/30/19 1559    Education Details  HEP    Person(s) Educated  Patient    Methods  Explanation    Comprehension  Verbalized understanding;Returned demonstration;Verbal cues required;Tactile cues required       PT Short Term Goals - 03/08/19 1806      PT SHORT TERM GOAL #1   Title  Patient will be independent in home exercise program to improve strength/mobility for better functional independence with ADLs.    Time  4    Period  Weeks    Status  New    Target Date  04/05/19      PT SHORT TERM GOAL #2   Title  Patient (< 46 years old) will complete five times sit to stand test in < 10 seconds indicating an increased LE strength and improved balance.    Time  4    Period  Weeks    Status  New    Target Date  04/05/19        PT Long Term Goals - 03/08/19 1809      PT LONG TERM GOAL #1   Title  Patient (> 30 years old) will complete five times sit to stand test in < 15 seconds indicating an increased LE strength and improved balance.    Time  8    Period  Weeks    Status  New    Target Date  05/03/19      PT LONG TERM GOAL #2   Title  Patient will increase Berg Balance score by > 6 points to demonstrate decreased fall risk during functional activities.    Time  8    Period  Weeks    Status  New  Target Date  05/03/19      PT LONG TERM GOAL #3   Title  Patient will increase 10 meter walk test  to >1.69m/s as to improve gait speed for better community ambulation and to reduce fall risk.    Time  8    Period  Weeks    Status  New    Target Date  05/03/19            Plan - 03/30/19 1648    Clinical Impression Statement  Patient demonstrates improved stability and strength allowing patient to perform short duration standing interventions with rest periods.  Patient performs beginning standing dynamic standing balance exercises to shift weight and perform NBOS standing activities with min assist.. Patient will continue to benefit from skilled physical therapy to improve pain and mobility    Personal Factors and Comorbidities  Age;Comorbidity 1    Comorbidities  COPD,    Examination-Participation Restrictions  Yard Work;Cleaning;Community Activity    Stability/Clinical Decision Making  Evolving/Moderate complexity    Rehab Potential  Fair    PT Frequency  2x / week    PT Duration  8 weeks    PT Treatment/Interventions  Gait training;Therapeutic activities;Balance training;Therapeutic exercise;Patient/family education    PT Next Visit Plan  strengthening and balance    PT Home Exercise Plan  heel raises, hip abd, hip ext YTB    Consulted and Agree with Plan of Care  Patient       Patient will benefit from skilled therapeutic intervention in order to improve the following deficits and impairments:  Abnormal gait, Decreased balance, Decreased mobility, Difficulty walking, Cardiopulmonary status limiting activity, Decreased activity tolerance, Decreased strength, Pain  Visit Diagnosis: Difficulty in walking, not elsewhere classified  Muscle weakness (generalized)     Problem List Patient Active Problem List   Diagnosis Date Noted  . Dehydration 03/14/2019  . Gross hematuria 05/12/2018  . Sick sinus syndrome ([place]) 07/08/2016  . Goals of care, counseling/discussion 07/08/2016  . 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 Chronic fatigue   . Atherosclerosis of  coronary artery bypass graft of native heart   . PAD (peripheral artery disease) ([place])   . OSA on CPAP   . TIA (transient ischemic attack) 11/02/2015  . Chronic diastolic CHF (congestive heart failure) ([place]) 09/20/2015  . CLL (chronic lymphocytic leukemia) ([place])   . PTSD (post-traumatic stress disorder)   . Osteoarthritis of both knees 07/05/2015  . COPD (chronic obstructive pulmonary disease) ([place]) 01/01/2012  . Shortness of breath 10/09/2011  . Paroxysmal atrial fibrillation ([place]) 10/09/2011  . Hyperlipidemia 11/28/2009  . Essential hypertension 11/28/2009    Alanson Puls, PT DPT 03/30/2019, 4:49 PM  [place] MAIN [facility] SERVICES [address] [place], [place], [address] Phone: [phone]   Fax:  [phone]  Name: TYHIEM MADEY MRN: [id number] Date of Birth: 12-20-1945

## 2019-04-04 ENCOUNTER — Other Ambulatory Visit: Payer: Self-pay

## 2019-04-04 ENCOUNTER — Ambulatory Visit: Payer: Medicare HMO | Admitting: Physical Therapy

## 2019-04-04 ENCOUNTER — Encounter: Payer: Self-pay | Admitting: Internal Medicine

## 2019-04-04 ENCOUNTER — Ambulatory Visit: Payer: Medicare HMO

## 2019-04-04 ENCOUNTER — Inpatient Hospital Stay (HOSPITAL_BASED_OUTPATIENT_CLINIC_OR_DEPARTMENT_OTHER): Payer: Medicare HMO | Admitting: Internal Medicine

## 2019-04-04 ENCOUNTER — Other Ambulatory Visit: Payer: Self-pay | Admitting: Internal Medicine

## 2019-04-04 ENCOUNTER — Telehealth: Payer: Self-pay | Admitting: Pharmacist

## 2019-04-04 DIAGNOSIS — R269 Unspecified abnormalities of gait and mobility: Secondary | ICD-10-CM | POA: Diagnosis not present

## 2019-04-04 DIAGNOSIS — Z7901 Long term (current) use of anticoagulants: Secondary | ICD-10-CM | POA: Diagnosis not present

## 2019-04-04 DIAGNOSIS — R233 Spontaneous ecchymoses: Secondary | ICD-10-CM | POA: Diagnosis not present

## 2019-04-04 DIAGNOSIS — C9110 Chronic lymphocytic leukemia of B-cell type not having achieved remission: Secondary | ICD-10-CM | POA: Diagnosis not present

## 2019-04-04 DIAGNOSIS — R252 Cramp and spasm: Secondary | ICD-10-CM | POA: Diagnosis not present

## 2019-04-04 DIAGNOSIS — Z79899 Other long term (current) drug therapy: Secondary | ICD-10-CM | POA: Diagnosis not present

## 2019-04-04 DIAGNOSIS — I4891 Unspecified atrial fibrillation: Secondary | ICD-10-CM | POA: Diagnosis not present

## 2019-04-04 DIAGNOSIS — Z87891 Personal history of nicotine dependence: Secondary | ICD-10-CM | POA: Diagnosis not present

## 2019-04-04 DIAGNOSIS — Z5112 Encounter for antineoplastic immunotherapy: Secondary | ICD-10-CM | POA: Diagnosis not present

## 2019-04-04 LAB — CBC WITH DIFFERENTIAL/PLATELET
Abs Immature Granulocytes: 0.08 10*3/uL — ABNORMAL HIGH (ref 0.00–0.07)
Basophils Absolute: 0 10*3/uL (ref 0.0–0.1)
Basophils Relative: 0 %
Eosinophils Absolute: 0.1 10*3/uL (ref 0.0–0.5)
Eosinophils Relative: 1 %
HCT: 36.4 % — ABNORMAL LOW (ref 39.0–52.0)
Hemoglobin: 11.8 g/dL — ABNORMAL LOW (ref 13.0–17.0)
Immature Granulocytes: 1 %
Lymphocytes Relative: 30 %
Lymphs Abs: 2.2 10*3/uL (ref 0.7–4.0)
MCH: 32.9 pg (ref 26.0–34.0)
MCHC: 32.4 g/dL (ref 30.0–36.0)
MCV: 101.4 fL — ABNORMAL HIGH (ref 80.0–100.0)
Monocytes Absolute: 0.8 10*3/uL (ref 0.1–1.0)
Monocytes Relative: 11 %
Neutro Abs: 4.3 10*3/uL (ref 1.7–7.7)
Neutrophils Relative %: 57 %
Platelets: 134 10*3/uL — ABNORMAL LOW (ref 150–400)
RBC: 3.59 MIL/uL — ABNORMAL LOW (ref 4.22–5.81)
RDW: 15.8 % — ABNORMAL HIGH (ref 11.5–15.5)
WBC: 7.4 10*3/uL (ref 4.0–10.5)
nRBC: 0 % (ref 0.0–0.2)

## 2019-04-04 LAB — COMPREHENSIVE METABOLIC PANEL
ALT: 24 U/L (ref 0–44)
AST: 19 U/L (ref 15–41)
Albumin: 3.7 g/dL (ref 3.5–5.0)
Alkaline Phosphatase: 88 U/L (ref 38–126)
Anion gap: 7 (ref 5–15)
BUN: 25 mg/dL — ABNORMAL HIGH (ref 8–23)
CO2: 25 mmol/L (ref 22–32)
Calcium: 8.7 mg/dL — ABNORMAL LOW (ref 8.9–10.3)
Chloride: 109 mmol/L (ref 98–111)
Creatinine, Ser: 0.99 mg/dL (ref 0.61–1.24)
GFR calc Af Amer: >60 mL/min (ref >60)
GFR calc non Af Amer: >60 mL/min (ref >60)
Glucose, Bld: 113 mg/dL — ABNORMAL HIGH (ref 70–99)
Potassium: 3.8 mmol/L (ref 3.5–5.1)
Sodium: 141 mmol/L (ref 135–145)
Total Bilirubin: 0.8 mg/dL (ref 0.3–1.2)
Total Protein: 6.2 g/dL — ABNORMAL LOW (ref 6.5–8.1)

## 2019-04-04 LAB — LACTATE DEHYDROGENASE: LDH: 147 U/L (ref 98–192)

## 2019-04-04 LAB — MAGNESIUM: Magnesium: 2.3 mg/dL (ref 1.7–2.4)

## 2019-04-04 LAB — URIC ACID: Uric Acid, Serum: 3 mg/dL — ABNORMAL LOW (ref 3.7–8.6)

## 2019-04-04 NOTE — Progress Notes (Signed)
Patient stated that he is doing well.

## 2019-04-04 NOTE — Progress Notes (Signed)
[place] OFFICE PROGRESS NOTE  Patient Care Team: Leone Haven, MD as PCP - General (Family Medicine) [name], MD as Consulting Physician (Cardiology) Cammie Sickle, MD as Medical Oncologist (Hematology and Oncology)  Cancer Staging No matching staging information was found for the patient.   Oncology History Overview Note  # AUG 2015- SLL/CLL [Right Ax Ln Bx] s/p Benda-Rituxan x6 [finished March 2016]; Maintenance Rituxan q 32m[started April 2016; Dr.Pandit];Last Ritux Jan 2017.  MARCH 2017- CT N/C/A/P- NED. STOP Ritux; surveillance   # AUG 2019- CT/PET- progression/NO transformation;  # NOV 2019- Progression; started Ibrutinib 420 mg/d. STOPPED in end of feb sec to extreme fatigue/joint pains/cramps  #November 01, 2018-start ibrutinib 280 mg a day; December 20, 2018-discontinue ibrutinib secondary multiple side effects.   #January 03, 2019 start GBeloit[July]    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; IGVH- UN-MUTATED [poor prognosis]  DIAGNOSIS: CLL  STAGE: IV  ;GOALS: control  CURRENT/MOST RECENT THERAPY : Gazyva + venetoclax [pending]    CLL (chronic lymphocytic leukemia) ([place]  01/03/2019 -  Chemotherapy   The patient had obinutuzumab (GAZYVA) 100 mg in sodium chloride 0.9 % 100 mL (0.9615 mg/mL) chemo infusion, 100 mg, Intravenous, Once, 3 of 6 cycles Administration: 100 mg (01/03/2019), 900 mg (01/04/2019), 1,000 mg (01/10/2019), 1,000 mg (01/31/2019), 1,000 mg (01/17/2019), 1,000 mg (02/28/2019)  for chemotherapy treatment.      INTERVAL HISTORY:  TSHON INDELICATO89y.o.  male CLL-high risk currently on GDyann Kiefis here for follow-up; currently status post 3 cycles is here for follow-up.  Patient currently on venetoclax modified ramp-up.  He started taking 100 mg once a  day starting yesterday.  Complains of mild cramping in the legs.  Otherwise denies any falls.  Physical therapy helping his gait instability.  Review of Systems  Constitutional: Positive for malaise/fatigue. Negative for chills, diaphoresis and fever.  HENT: Negative for nosebleeds and sore throat.   Eyes: Negative for double vision.  Respiratory: Negative for cough, hemoptysis, sputum production, shortness of breath and wheezing.   Cardiovascular: Negative for chest pain, palpitations, orthopnea and leg swelling.  Gastrointestinal: Negative for abdominal pain, blood in stool, constipation, diarrhea, heartburn, melena, nausea and vomiting.  Genitourinary: Negative for dysuria, frequency and urgency.  Musculoskeletal: Positive for back pain and joint pain.  Skin: Negative.  Negative for itching and rash.  Neurological: Negative for dizziness, tingling, focal weakness and headaches.  Psychiatric/Behavioral: Negative for depression. The patient is not nervous/anxious and does not have insomnia.       PAST MEDICAL HISTORY :  Past Medical History:  Diagnosis Date  . Anxiety   . Arthritis   . Atrial fibrillation ([place]    a. Dx 2013, recurred 02/2014, CHA2DS2VASc = 3 -->placed on Eliquis;  b. 02/2014 Echo: EF 50-55%, mid and apical anterior septum and mid and apical inf septum are abnl, mild to mod Ao sclerosis w/o AS.  .Marland KitchenChicken pox   . Chronic lymphocytic leukemia ([place]    a. Dx 02/2014.  .Marland KitchenComplication of anesthesia    History of  PTSD--do not touch patient when waking up from surgery.  .Marland KitchenCOPD (chronic obstructive pulmonary disease) ([place]   . Coronary artery disease    a. 04/2009 CABG x 3 (LIMA->LAD, VG->OM1, VG->PDA);  b. 09/2009 Cath:  occluded VG x 2 w/ patent LIMA and L->R collats. EF 55%, mild antlat HK;  c. 10/2011 MV: EF 53%, no isch/infarct-->low risk.  Marland Kitchen Dysrhythmia    hx of a-fib  . GERD (gastroesophageal reflux disease)    occasional  . History of chemotherapy 2015-2016  . HOH  (hard of hearing)    Bilateral Hearing Aids  . Hypertension   . Myocardial infarction ([place]) 2010  . OSA on CPAP    USE C-PAP  . Presence of permanent cardiac pacemaker 2017  . PTSD (post-traumatic stress disorder)   . PTSD (post-traumatic stress disorder)   . Pure hypercholesterolemia   . Rheumatic fever 1959  . 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 ABDOMINAL HERNIA REPAIR    . APPENDECTOMY  06/21/1985  . CARDIAC CATHETERIZATION  2010; 2011   ; Dr Fletcher Anon  . CORONARY ARTERY BYPASS GRAFT  04/2009   "CABG X3"  . EP IMPLANTABLE DEVICE N/A 03/03/2016   Procedure: Pacemaker Implant;  Surgeon: Deboraha Sprang, MD;  Location: [facility];  Service: Cardiovascular;  Laterality: N/A;  . FOREIGN BODY REMOVAL  1968   "shrapnel in my tailbone"  . INGUINAL HERNIA REPAIR Right   . INSERT / REPLACE / REMOVE PACEMAKER    . JOINT REPLACEMENT Right 2018  . LAPAROSCOPIC CHOLECYSTECTOMY    . TONSILLECTOMY AND ADENOIDECTOMY  1956  . TOTAL HIP ARTHROPLASTY Right 10/22/2016   Procedure: TOTAL HIP ARTHROPLASTY;  Surgeon: Dereck Leep, MD;  Location: [facility];  Service: Orthopedics;  Laterality: Right;  . TOTAL HIP ARTHROPLASTY Left 11/04/2017   Procedure: TOTAL HIP ARTHROPLASTY;  Surgeon: Dereck Leep, MD;  Location: [facility];  Service: Orthopedics;  Laterality: Left;    FAMILY HISTORY :   Family History  Problem Relation Age of Onset  . Heart disease Mother   . Heart attack Mother   . Coronary artery disease Other        family history    SOCIAL HISTORY:   Social History   Tobacco Use  . Smoking status: Former Smoker    Packs/day: 1.00    Years: 40.00    Pack years: 40.00    Types: Cigarettes    Quit date: 07/21/2006    Years since quitting: 12.7  . Smokeless tobacco: Never Used  Substance Use Topics  . Alcohol use: Yes    Alcohol/week: 1.0 standard drinks    Types: 1 Standard  drinks or equivalent per week    Comment: rarely  . Drug use: No    ALLERGIES:  has No Known Allergies.  MEDICATIONS:  Current Outpatient Medications  Medication Sig Dispense Refill  . acetaminophen (TYLENOL) 500 MG tablet Take 1,000 mg by mouth every 8 (eight) hours as needed for mild pain.    Marland Kitchen acyclovir (ZOVIRAX) 400 MG tablet One pill a day [to prevent shingles] 30 tablet 3  . albuterol (PROVENTIL HFA;VENTOLIN HFA) 108 (90 Base) MCG/ACT inhaler Inhale 2 puffs into the lungs every 6 (six) hours as needed for wheezing or shortness of breath. 1 Inhaler 11  . allopurinol (ZYLOPRIM) 300 MG tablet Take 1 tablet by mouth twice daily 120 tablet 3  . amiodarone (PACERONE) 200 MG tablet TAKE 1/2 TABLET (100 mg)  BY MOUTH TWICE DAILY 90 tablet 2  . apixaban (ELIQUIS) 5 MG TABS tablet Take 5 mg by mouth 2 (two) times daily.    Marland Kitchen  atorvastatin (LIPITOR) 80 MG tablet Take 1 tablet (80 mg total) by mouth at bedtime. 90 tablet 3  . budesonide-formoterol (SYMBICORT) 160-4.5 MCG/ACT inhaler Inhale 2 puffs into the lungs 2 (two) times daily. 1 Inhaler 11  . cetirizine (ZYRTEC) 10 MG tablet Take 10 mg by mouth daily as needed for allergies.     . Coenzyme Q10 (COQ10) 200 MG CAPS Take 200 mg by mouth daily.    Marland Kitchen dexamethasone (DECADRON) 4 MG tablet Start 2 days prior to infusion; Take for 2 days. Do not take on the day of infusion. 60 tablet 3  . ezetimibe (ZETIA) 10 MG tablet Take 1 tablet (10 mg total) by mouth daily. 90 tablet 3  . furosemide (LASIX) 20 MG tablet Take 1 tablet (20 mg) by mouth twice daily as needed    . Krill Oil 350 MG CAPS Take 350 mg by mouth every evening.    . metoprolol tartrate (LOPRESSOR) 25 MG tablet Take 25 mg by mouth 2 (two) times daily.    . mirtazapine (REMERON) 15 MG tablet Take 15 mg by mouth at bedtime as needed (for panic associated with PTSD).     Marland Kitchen montelukast (SINGULAIR) 10 MG tablet Take 1 tablet (10 mg total) by mouth at bedtime. Start 2 days prior to infusion. Take  it for 4 days. 60 tablet 0  . Multiple Vitamin (MULTIVITAMIN WITH MINERALS) TABS tablet Take 1 tablet by mouth daily.    . mupirocin ointment (BACTROBAN) 2 % Apply 1 application topically 3 (three) times daily. 30 g 1  . nitroGLYCERIN (NITROSTAT) 0.4 MG SL tablet Place 1 tablet (0.4 mg total) under the tongue every 5 (five) minutes as needed for chest pain. 25 tablet 6  . tiotropium (SPIRIVA) 18 MCG inhalation capsule Place 1 capsule (18 mcg total) into inhaler and inhale at bedtime. 30 capsule 11  . traMADol (ULTRAM) 50 MG tablet TAKE 1 TABLET BY MOUTH EVERY 12 HOURS AS NEEDED FOR MODERATE PAIN 90 tablet 0  . venetoclax 10 & 50 & 100 MG TBPK Take by mouth daily. Dose escalate weekly to goal dose of 249m     No current facility-administered medications for this visit.     PHYSICAL EXAMINATION: ECOG PERFORMANCE STATUS: 1 - Symptomatic but completely ambulatory  BP 116/78 (BP Location: Left Arm, Patient Position: Sitting)   Pulse 65   Temp 98.2 F (36.8 C) (Tympanic)   Ht 5' 9" (1.753 m)   Wt 227 lb (103 kg)   BMI 33.52 kg/m   Filed Weights   04/04/19 0901  Weight: 227 lb (103 kg)    Physical Exam  Constitutional: He is oriented to person, place, and time and well-developed, well-nourished, and in no distress.  Patient is alone.  HENT:  Head: Normocephalic and atraumatic.  Mouth/Throat: Oropharynx is clear and moist. No oropharyngeal exudate.  Eyes: Pupils are equal, round, and reactive to light.  Neck: Normal range of motion. Neck supple.  Cardiovascular: Normal rate and regular rhythm.  Pulmonary/Chest: No respiratory distress. He has no wheezes.  Abdominal: Soft. Bowel sounds are normal. He exhibits no distension and no mass. There is no abdominal tenderness. There is no rebound and no guarding.  Musculoskeletal: Normal range of motion.        General: No tenderness or edema.  Lymphadenopathy:  Resolved lymph nodes in the neck underarms.  Neurological: He is alert and  oriented to person, place, and time.  Skin: Skin is warm.  Psychiatric: Affect normal.  LABORATORY DATA:  I have reviewed the data as listed    Component Value Date/Time   NA 141 04/04/2019 0839   NA 139 10/11/2014 1800   K 3.8 04/04/2019 0839   K 3.3 (L) 10/11/2014 1800   CL 109 04/04/2019 0839   CL 106 10/11/2014 1800   CO2 25 04/04/2019 0839   CO2 27 10/11/2014 1800   GLUCOSE 113 (H) 04/04/2019 0839   GLUCOSE 107 (H) 10/11/2014 1800   BUN 25 (H) 04/04/2019 0839   BUN 15 10/11/2014 1800   CREATININE 0.99 04/04/2019 0839   CREATININE 0.89 10/11/2014 1800   CALCIUM 8.7 (L) 04/04/2019 0839   CALCIUM 8.8 (L) 10/11/2014 1800   PROT 6.2 (L) 04/04/2019 0839   PROT 6.7 05/18/2017 1048   PROT 6.4 (L) 10/11/2014 1800   ALBUMIN 3.7 04/04/2019 0839   ALBUMIN 4.3 05/18/2017 1048   ALBUMIN 4.1 10/11/2014 1800   AST 19 04/04/2019 0839   AST 23 10/11/2014 1800   ALT 24 04/04/2019 0839   ALT 22 10/11/2014 1800   ALKPHOS 88 04/04/2019 0839   ALKPHOS 61 10/11/2014 1800   BILITOT 0.8 04/04/2019 0839   BILITOT 0.7 05/18/2017 1048   BILITOT 0.9 10/11/2014 1800   GFRNONAA >60 04/04/2019 0839   GFRNONAA >60 10/11/2014 1800   GFRAA >60 04/04/2019 0839   GFRAA >60 10/11/2014 1800    No results found for: SPEP, UPEP  Lab Results  Component Value Date   WBC 7.4 04/04/2019   NEUTROABS 4.3 04/04/2019   HGB 11.8 (L) 04/04/2019   HCT 36.4 (L) 04/04/2019   MCV 101.4 (H) 04/04/2019   PLT 134 (L) 04/04/2019      Chemistry      Component Value Date/Time   NA 141 04/04/2019 0839   NA 139 10/11/2014 1800   K 3.8 04/04/2019 0839   K 3.3 (L) 10/11/2014 1800   CL 109 04/04/2019 0839   CL 106 10/11/2014 1800   CO2 25 04/04/2019 0839   CO2 27 10/11/2014 1800   BUN 25 (H) 04/04/2019 0839   BUN 15 10/11/2014 1800   CREATININE 0.99 04/04/2019 0839   CREATININE 0.89 10/11/2014 1800      Component Value Date/Time   CALCIUM 8.7 (L) 04/04/2019 0839   CALCIUM 8.8 (L) 10/11/2014 1800    ALKPHOS 88 04/04/2019 0839   ALKPHOS 61 10/11/2014 1800   AST 19 04/04/2019 0839   AST 23 10/11/2014 1800   ALT 24 04/04/2019 0839   ALT 22 10/11/2014 1800   BILITOT 0.8 04/04/2019 0839   BILITOT 0.7 05/18/2017 1048   BILITOT 0.9 10/11/2014 1800       RADIOGRAPHIC STUDIES: I have personally reviewed the radiological images as listed and agreed with the findings in the report. No results found.   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 Currently s/p 3 cycles of Gazyva.   # Proceed Venatoclax D-15; 100 mg/day [sec to amiodarone-goal 200 mg a day; starting 9/7]; today white count is 11. hemoglobin 11 platelets- 134. Will plan #4 this week-because of scheduling issues.  No evidence of tumor lysis.   # Spontaneous bruising-on Eliquis; platelets- slightly low at 134; . Stable.    # A.fib-on amiodarone sinus rhythm-on Eliquis-stable  # TLS/infectious Prophylaxis-allopurinol; Bactrim/acyclovir.  Stable.   DISPOSITION: # Gazyva-[thursday] this week.  # follow up in 2 weeks- MD- labs- cbc/cmp/ldh/mag phos/uric acid;-  Dr.B  Orders Placed This Encounter  Procedures  . CBC with Differential    Standing Status:   Future    Standing Expiration Date:   04/03/2020  . Comprehensive metabolic panel    Standing Status:   Future    Standing Expiration Date:   04/03/2020  . Lactate dehydrogenase    Standing Status:   Future    Standing Expiration Date:   04/03/2020  . Magnesium    Standing Status:   Future    Standing Expiration Date:   04/03/2020  . Phosphorus    Standing Status:   Future    Standing Expiration Date:   04/03/2020  . Uric acid    Standing Status:   Future    Standing Expiration Date:   04/03/2020   All questions were answered. The patient knows to call the clinic with any problems, questions or concerns.      Cammie Sickle, MD 04/04/2019 12:14 PM

## 2019-04-04 NOTE — Assessment & Plan Note (Addendum)
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 Currently s/p 3 cycles of Gazyva.   # Proceed Venatoclax D-15; 100 mg/day [sec to amiodarone-goal 200 mg a day; starting 9/7]; today white count is 11. hemoglobin 11 platelets- 134. Will plan #4 this week-because of scheduling issues.  No evidence of tumor lysis.   # Spontaneous bruising-on Eliquis; platelets- slightly low at 134; . Stable.    # A.fib-on amiodarone sinus rhythm-on Eliquis-stable  # TLS/infectious Prophylaxis-allopurinol; Bactrim/acyclovir.  Stable.   DISPOSITION: # Gazyva-[thursday] this week.  # follow up in 2 weeks- MD- labs- cbc/cmp/ldh/mag phos/uric acid;-  Dr.B

## 2019-04-05 ENCOUNTER — Other Ambulatory Visit: Payer: Self-pay | Admitting: Internal Medicine

## 2019-04-05 DIAGNOSIS — C9110 Chronic lymphocytic leukemia of B-cell type not having achieved remission: Secondary | ICD-10-CM

## 2019-04-05 NOTE — Telephone Encounter (Signed)
Oral Chemotherapy Pharmacist Encounter   Met with patient following his OV with Dr. Rogue Bussing. Dr. B would like for him to remain on 190m of venetoclax for the time being due to his platelet count decreasing. I reviewed with [name] to take this dosing using the remaining 2 weeks of his starter kits. He knows that the last week of the started kit he should only take 1 tablet (1060m, instead of the blistered 2 tablets unless Dr. BrRogue Bussingnstructs him other wise.  Again, [name] he was feeling well on the medication.  AlDarl PikesPharmD, BCPS, BCPacific Coast Surgery Center 7 LLCematology/Oncology Clinical Pharmacist [facility]/AP [phone]/15/2020 1:20 PM

## 2019-04-06 ENCOUNTER — Encounter: Payer: Self-pay | Admitting: Physical Therapy

## 2019-04-06 ENCOUNTER — Other Ambulatory Visit: Payer: Self-pay

## 2019-04-06 ENCOUNTER — Other Ambulatory Visit: Payer: Self-pay | Admitting: Internal Medicine

## 2019-04-06 ENCOUNTER — Ambulatory Visit: Payer: Medicare HMO | Admitting: Physical Therapy

## 2019-04-06 DIAGNOSIS — M6281 Muscle weakness (generalized): Secondary | ICD-10-CM

## 2019-04-06 DIAGNOSIS — C9110 Chronic lymphocytic leukemia of B-cell type not having achieved remission: Secondary | ICD-10-CM

## 2019-04-06 DIAGNOSIS — R262 Difficulty in walking, not elsewhere classified: Secondary | ICD-10-CM

## 2019-04-06 NOTE — Therapy (Signed)
[place] MAIN [facility] SERVICES [address] [place], [place], [address] Phone: [phone]   Fax:  [phone]  Physical Therapy Treatment  Patient Details  Name: [name] MRN: [id number] Date of Birth: [birth year] Referring Provider (PT): Cindra Presume   Encounter Date: 04/06/2019  PT End of Session - 04/06/19 0935    Visit Number  5    Number of Visits  17    Date for PT Re-Evaluation  05/03/19    PT Start Time  0930    PT Stop Time  1010    PT Time Calculation (min)  40 min    Equipment Utilized During Treatment  Gait belt    Activity Tolerance  Patient tolerated treatment well    Behavior During Therapy  [facility] for tasks assessed/performed       Past Medical History:  Diagnosis Date  . Anxiety   . Arthritis   . Atrial fibrillation ([place])    a. Dx 2013, recurred 02/2014, CHA2DS2VASc = 3 -->placed on Eliquis;  b. 02/2014 Echo: EF 50-55%, mid and apical anterior septum and mid and apical inf septum are abnl, mild to mod Ao sclerosis w/o AS.  Marland Kitchen Chicken pox   . Chronic lymphocytic leukemia ([place])    a. Dx 02/2014.  Marland Kitchen Complication of anesthesia    History of  PTSD--do not touch patient when waking up from surgery.  Marland Kitchen COPD (chronic obstructive pulmonary disease) ([place])   . Coronary artery disease    a. 04/2009 CABG x 3 (LIMA->LAD, VG->OM1, VG->PDA);  b. 09/2009 Cath: occluded VG x 2 w/ patent LIMA and L->R collats. EF 55%, mild antlat HK;  c. 10/2011 MV: EF 53%, no isch/infarct-->low risk.  Marland Kitchen Dysrhythmia    hx of a-fib  . GERD (gastroesophageal reflux disease)    occasional  . History of chemotherapy 2015-2016  . HOH (hard of hearing)    Bilateral Hearing Aids  . Hypertension   . Myocardial infarction ([place]) 2010  . OSA on CPAP    USE C-PAP  . Presence of permanent cardiac pacemaker 2017  . PTSD (post-traumatic stress disorder)   . PTSD (post-traumatic stress disorder)   . Pure hypercholesterolemia   . Rheumatic fever 1959  .  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 ABDOMINAL HERNIA REPAIR    . APPENDECTOMY  06/21/1985  . CARDIAC CATHETERIZATION  2010; 2011   ; Dr Fletcher Anon  . CORONARY ARTERY BYPASS GRAFT  04/2009   "CABG X3"  . EP IMPLANTABLE DEVICE N/A 03/03/2016   Procedure: Pacemaker Implant;  Surgeon: Deboraha Sprang, MD;  Location: [place] [facility];  Service: Cardiovascular;  Laterality: N/A;  . FOREIGN BODY REMOVAL  1968   "shrapnel in my tailbone"  . INGUINAL HERNIA REPAIR Right   . INSERT / REPLACE / REMOVE PACEMAKER    . JOINT REPLACEMENT Right 2018  . LAPAROSCOPIC CHOLECYSTECTOMY    . TONSILLECTOMY AND ADENOIDECTOMY  1956  . TOTAL HIP ARTHROPLASTY Right 10/22/2016   Procedure: TOTAL HIP ARTHROPLASTY;  Surgeon: Dereck Leep, MD;  Location: [facility];  Service: Orthopedics;  Laterality: Right;  . TOTAL HIP ARTHROPLASTY Left 11/04/2017   Procedure: TOTAL HIP ARTHROPLASTY;  Surgeon: Dereck Leep, MD;  Location: [facility];  Service: Orthopedics;  Laterality: Left;    There were no vitals filed for this visit.  Subjective Assessment - 04/06/19 0933    Subjective  Patient is having 7/ 10 pain in BLE today.    Pertinent History  Patient is having muscle pain in BLE and calfs. He is having pain that comes and goes but never goes away all the way. He is having balance difficulty and he is stumbling. He is on a blood thinner and had abdominal anersum.    Currently in Pain?  Yes    Pain Score  7     Pain Location  Leg    Pain Orientation  Right;Left    Pain Descriptors / Indicators  Aching          Treatment: Octane fitness x 5 mins L 2   Leg press 100 lbs x 20 x 3 , heel raises 60 lbs x 20 x 3   Matrix x 3 reps 22. 5 lbs fwd/bwd, side to side, CGA for balance , cues to slow down and for correct posture  Standing on blue foam with feet apart, feet together, tapping left and right from foam, tapping 4 inch surface , head turns  left and right with CGA and minimal UE assis  Stepping over blue 1/2 foam fwd/bwd, side to side with intermittent rail assist and cGA x 10   Stepping over hurdle fwd/bwd, side to side with UE assist x 10   Standing on 1/2 foam feet apart flat side up and flat side down and minimal UE assist  X 1 min x 2 sets  Balance beam side stepping x 5 laps with min assist and CGA for loss of balance   Patient needs VC and TC for safety and correct technique for lateral stepping far enough to each side. Patient has posterior LOB intermittently during treatment and no rest periods                         PT Education - 04/06/19 0934    Education Details  HEP    Person(s) Educated  Patient    Methods  Explanation;Demonstration;Tactile cues;Verbal cues    Comprehension  Verbalized understanding;Returned demonstration;Need further instruction       PT Short Term Goals - 03/08/19 1806      PT SHORT TERM GOAL #1   Title  Patient will be independent in home exercise program to improve strength/mobility for better functional independence with ADLs.    Time  4    Period  Weeks    Status  New    Target Date  04/05/19      PT SHORT TERM GOAL #2   Title  Patient (< 50 years old) will complete five times sit to stand test in < 10 seconds indicating an increased LE strength and improved balance.    Time  4    Period  Weeks    Status  New    Target Date  04/05/19        PT Long Term Goals - 03/08/19 1809      PT LONG TERM GOAL #1   Title  Patient (> 38 years old) will complete five times sit to stand test in < 15 seconds indicating an increased LE strength and improved balance.    Time  8    Period  Weeks    Status  New    Target Date  05/03/19      PT LONG TERM GOAL #2   Title  Patient will increase Berg Balance score by > 6 points to demonstrate decreased fall risk during functional  activities.    Time  8    Period  Weeks    Status  New    Target Date  05/03/19       PT LONG TERM GOAL #3   Title  Patient will increase 10 meter walk test to >1.86m/s as to improve gait speed for better community ambulation and to reduce fall risk.    Time  8    Period  Weeks    Status  New    Target Date  05/03/19            Plan - 04/06/19 0935    Clinical Impression Statement  Patient demonstrates LOB with standing balance exercises indicating decreased balancing strategies. Patient did require UE support to perform intermediate balance challenges.  Patient will benefit from further skilled therapy to return to prior level of function.  Pt was encouraged to perform HEP during the week in order to continue progressing balance and strength interventions.  Pt would continue to benefit from skilled therapy services in order to further address LE strength deficits and balance deficits in order to decrease fall risk and improve mobility    Personal Factors and Comorbidities  Age;Comorbidity 1    Comorbidities  COPD,    Examination-Participation Restrictions  Yard Work;Cleaning;Community Activity    Stability/Clinical Decision Making  Evolving/Moderate complexity    Rehab Potential  Fair    PT Frequency  2x / week    PT Duration  8 weeks    PT Treatment/Interventions  Gait training;Therapeutic activities;Balance training;Therapeutic exercise;Patient/family education    PT Next Visit Plan  strengthening and balance    PT Home Exercise Plan  heel raises, hip abd, hip ext YTB    Consulted and Agree with Plan of Care  Patient       Patient will benefit from skilled therapeutic intervention in order to improve the following deficits and impairments:  Abnormal gait, Decreased balance, Decreased mobility, Difficulty walking, Cardiopulmonary status limiting activity, Decreased activity tolerance, Decreased strength, Pain  Visit Diagnosis: Difficulty in walking, not elsewhere classified  Muscle weakness (generalized)     Problem List Patient Active Problem List    Diagnosis Date Noted  . Dehydration 03/14/2019  . Gross hematuria 05/12/2018  . Sick sinus syndrome ([place]) 07/08/2016  . Goals of care, counseling/discussion 07/08/2016  . 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 Chronic fatigue   . 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 PAD (peripheral artery disease) ([place])   . OSA on CPAP   . TIA (transient ischemic attack) 11/02/2015  . Chronic diastolic CHF (congestive heart failure) ([place]) 09/20/2015  . CLL (chronic lymphocytic leukemia) ([place])   . PTSD (post-traumatic stress disorder)   . Osteoarthritis of both knees 07/05/2015  . COPD (chronic obstructive pulmonary disease) ([place]) 01/01/2012  . Shortness of breath 10/09/2011  . Paroxysmal atrial fibrillation ([place]) 10/09/2011  . Hyperlipidemia 11/28/2009  . Essential hypertension 11/28/2009    Alanson Puls, PT DPT 04/06/2019, 10:06 AM  [place] MAIN [facility] SERVICES [address] [place], [place], [address] Phone: [phone]   Fax:  [phone]  Name: [name] MRN: [id number] Date of Birth: [birth year]

## 2019-04-07 ENCOUNTER — Other Ambulatory Visit: Payer: Self-pay

## 2019-04-07 ENCOUNTER — Inpatient Hospital Stay: Payer: Medicare HMO

## 2019-04-07 ENCOUNTER — Other Ambulatory Visit: Payer: Self-pay | Admitting: Internal Medicine

## 2019-04-07 VITALS — BP 150/82 | HR 66 | Temp 97.7°F | Resp 18 | Wt 228.4 lb

## 2019-04-07 DIAGNOSIS — R233 Spontaneous ecchymoses: Secondary | ICD-10-CM | POA: Diagnosis not present

## 2019-04-07 DIAGNOSIS — Z7901 Long term (current) use of anticoagulants: Secondary | ICD-10-CM | POA: Diagnosis not present

## 2019-04-07 DIAGNOSIS — C9110 Chronic lymphocytic leukemia of B-cell type not having achieved remission: Secondary | ICD-10-CM | POA: Diagnosis not present

## 2019-04-07 DIAGNOSIS — R252 Cramp and spasm: Secondary | ICD-10-CM | POA: Diagnosis not present

## 2019-04-07 DIAGNOSIS — Z87891 Personal history of nicotine dependence: Secondary | ICD-10-CM | POA: Diagnosis not present

## 2019-04-07 DIAGNOSIS — Z5112 Encounter for antineoplastic immunotherapy: Secondary | ICD-10-CM | POA: Diagnosis not present

## 2019-04-07 DIAGNOSIS — Z7189 Other specified counseling: Secondary | ICD-10-CM

## 2019-04-07 DIAGNOSIS — Z79899 Other long term (current) drug therapy: Secondary | ICD-10-CM | POA: Diagnosis not present

## 2019-04-07 DIAGNOSIS — I4891 Unspecified atrial fibrillation: Secondary | ICD-10-CM | POA: Diagnosis not present

## 2019-04-07 DIAGNOSIS — R269 Unspecified abnormalities of gait and mobility: Secondary | ICD-10-CM | POA: Diagnosis not present

## 2019-04-07 MED ORDER — SODIUM CHLORIDE 0.9 % IV SOLN
20.0000 mg | Freq: Once | INTRAVENOUS | Status: AC
  Administered 2019-04-07: 10:00:00 20 mg via INTRAVENOUS
  Filled 2019-04-07: qty 2

## 2019-04-07 MED ORDER — ACETAMINOPHEN 325 MG PO TABS
650.0000 mg | ORAL_TABLET | Freq: Once | ORAL | Status: AC
  Administered 2019-04-07: 10:00:00 650 mg via ORAL
  Filled 2019-04-07: qty 2

## 2019-04-07 MED ORDER — SODIUM CHLORIDE 0.9 % IV SOLN
Freq: Once | INTRAVENOUS | Status: AC
  Administered 2019-04-07: 09:00:00 via INTRAVENOUS
  Filled 2019-04-07: qty 250

## 2019-04-07 MED ORDER — SODIUM CHLORIDE 0.9 % IV SOLN
1000.0000 mg | Freq: Once | INTRAVENOUS | Status: AC
  Administered 2019-04-07: 11:00:00 1000 mg via INTRAVENOUS
  Filled 2019-04-07: qty 40

## 2019-04-07 MED ORDER — DIPHENHYDRAMINE HCL 50 MG/ML IJ SOLN
50.0000 mg | Freq: Once | INTRAMUSCULAR | Status: AC
  Administered 2019-04-07: 50 mg via INTRAVENOUS
  Filled 2019-04-07: qty 1

## 2019-04-11 ENCOUNTER — Ambulatory Visit: Payer: Medicare HMO

## 2019-04-11 ENCOUNTER — Other Ambulatory Visit: Payer: Self-pay

## 2019-04-11 DIAGNOSIS — R262 Difficulty in walking, not elsewhere classified: Secondary | ICD-10-CM | POA: Diagnosis not present

## 2019-04-11 DIAGNOSIS — G4733 Obstructive sleep apnea (adult) (pediatric): Secondary | ICD-10-CM | POA: Diagnosis not present

## 2019-04-11 DIAGNOSIS — M6281 Muscle weakness (generalized): Secondary | ICD-10-CM

## 2019-04-11 NOTE — Therapy (Signed)
[place] MAIN [facility] SERVICES [address] [place], [place], [address] Phone: [phone]   Fax:  [phone]  Physical Therapy Treatment  Patient Details  Name: [name] MRN: [id number] Date of Birth: 12-Feb-1946 Referring Provider (PT): Cindra Presume   Encounter Date: 04/11/2019  PT End of Session - 04/11/19 0934    Visit Number  6    Number of Visits  17    Date for PT Re-Evaluation  05/03/19    PT Start Time  0927    PT Stop Time  1007    PT Time Calculation (min)  40 min    Activity Tolerance  Patient tolerated treatment well;No increased pain    Behavior During Therapy  WFL for tasks assessed/performed       Past Medical History:  Diagnosis Date  . Anxiety   . Arthritis   . Atrial fibrillation ([place])    a. Dx 2013, recurred 02/2014, CHA2DS2VASc = 3 -->placed on Eliquis;  b. 02/2014 Echo: EF 50-55%, mid and apical anterior septum and mid and apical inf septum are abnl, mild to mod Ao sclerosis w/o AS.  Marland Kitchen Chicken pox   . Chronic lymphocytic leukemia ([place])    a. Dx 02/2014.  Marland Kitchen Complication of anesthesia    History of  PTSD--do not touch patient when waking up from surgery.  Marland Kitchen COPD (chronic obstructive pulmonary disease) ([place])   . Coronary artery disease    a. 04/2009 CABG x 3 (LIMA->LAD, VG->OM1, VG->PDA);  b. 09/2009 Cath: occluded VG x 2 w/ patent LIMA and L->R collats. EF 55%, mild antlat HK;  c. 10/2011 MV: EF 53%, no isch/infarct-->low risk.  Marland Kitchen Dysrhythmia    hx of a-fib  . GERD (gastroesophageal reflux disease)    occasional  . History of chemotherapy 2015-2016  . HOH (hard of hearing)    Bilateral Hearing Aids  . Hypertension   . Myocardial infarction ([place]) 2010  . OSA on CPAP    USE C-PAP  . Presence of permanent cardiac pacemaker 2017  . PTSD (post-traumatic stress disorder)   . PTSD (post-traumatic stress disorder)   . Pure hypercholesterolemia   . Rheumatic fever 1959  . 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 ABDOMINAL HERNIA REPAIR    . APPENDECTOMY  06/21/1985  . CARDIAC CATHETERIZATION  2010; 2011   ; Dr Fletcher Anon  . CORONARY ARTERY BYPASS GRAFT  04/2009   "CABG X3"  . EP IMPLANTABLE DEVICE N/A 03/03/2016   Procedure: Pacemaker Implant;  Surgeon: Deboraha Sprang, MD;  Location: [place] [facility];  Service: Cardiovascular;  Laterality: N/A;  . FOREIGN BODY REMOVAL  1968   "shrapnel in my tailbone"  . INGUINAL HERNIA REPAIR Right   . INSERT / REPLACE / REMOVE PACEMAKER    . JOINT REPLACEMENT Right 2018  . LAPAROSCOPIC CHOLECYSTECTOMY    . TONSILLECTOMY AND ADENOIDECTOMY  1956  . TOTAL HIP ARTHROPLASTY Right 10/22/2016   Procedure: TOTAL HIP ARTHROPLASTY;  Surgeon: Dereck Leep, MD;  Location: [facility] ORS;  Service: Orthopedics;  Laterality: Right;  . TOTAL HIP ARTHROPLASTY Left 11/04/2017   Procedure: TOTAL HIP ARTHROPLASTY;  Surgeon: Dereck Leep, MD;  Location: [facility] ORS;  Service: Orthopedics;  Laterality: Left;    There were no vitals filed for this visit.  Subjective Assessment - 04/11/19 0931    Subjective  Pt had a good weekend. 8/10 leg pain  today from his 'back arthritis'.    Pertinent History  Patient is having muscle pain in BLE and calfs. He is having pain that comes and goes but never goes away all the way. He is having balance difficulty and he is stumbling. He is on a blood thinner and had abdominal anersum.    Currently in Pain?  Yes    Pain Score  8     Pain Location  --   bilat knee pain      Treatment: Octane fitness x 5 mins L2, moderate level aerobic exercise monitored for safety d/t AAA  Leg press 105 lbs x 20 x 3 heel raises 60 lbs x 20 x 3; multimodal cues for technique, as pt begins with leg press again  Matrix x2 reps 22. 5 lbs: FWD, Left, Right, BAckward *CGA-MinA for balance, cues to slow down and for correct posture     PT Short Term Goals - 03/08/19 1806      PT SHORT TERM  GOAL #1   Title  Patient will be independent in home exercise program to improve strength/mobility for better functional independence with ADLs.    Time  4    Period  Weeks    Status  New    Target Date  04/05/19      PT SHORT TERM GOAL #2   Title  Patient (< 88 years old) will complete five times sit to stand test in < 10 seconds indicating an increased LE strength and improved balance.    Time  4    Period  Weeks    Status  New    Target Date  04/05/19        PT Long Term Goals - 03/08/19 1809      PT LONG TERM GOAL #1   Title  Patient (> 63 years old) will complete five times sit to stand test in < 15 seconds indicating an increased LE strength and improved balance.    Time  8    Period  Weeks    Status  New    Target Date  05/03/19      PT LONG TERM GOAL #2   Title  Patient will increase Berg Balance score by > 6 points to demonstrate decreased fall risk during functional activities.    Time  8    Period  Weeks    Status  New    Target Date  05/03/19      PT LONG TERM GOAL #3   Title  Patient will increase 10 meter walk test to >1.36m/s as to improve gait speed for better community ambulation and to reduce fall risk.    Time  8    Period  Weeks    Status  New    Target Date  05/03/19            Plan - 04/11/19 0935    Clinical Impression Statement  In general, patient demonstrating good tolerance to therapy session this date, reasonable accommodations are alllowed in-session to allow adequate rest between activities as needed, however pt does not verbalize his fatigue much- author uses pt's body language as an indicator for rest requirement. Pt largely remains very motivated in spite of pain and exertion. All intervention executed with mild pain limitations and fatigue. Pt demonstrates focused motivation to fully participate in therapy to the best of ability. Pt given intermittent multimodal cues to teach best possible form with exercises, but some are retained from  prior  sessions. Pt does well to keep count of his reps, but sometimes needs a reminder of how many to do, going beyond what is initially asked if not monitored. Pt continues to make steady progress toward most goals. No home exercise updates made at this time.    PT Frequency  2x / week    PT Duration  8 weeks    PT Treatment/Interventions  Gait training;Therapeutic activities;Balance training;Therapeutic exercise;Patient/family education    PT Next Visit Plan  strengthening and balance    PT Home Exercise Plan  heel raises, hip abd, hip ext YTB    Consulted and Agree with Plan of Care  Patient       Patient will benefit from skilled therapeutic intervention in order to improve the following deficits and impairments:  Abnormal gait, Decreased balance, Decreased mobility, Difficulty walking, Cardiopulmonary status limiting activity, Decreased activity tolerance, Decreased strength, Pain  Visit Diagnosis: Difficulty in walking, not elsewhere classified  Muscle weakness (generalized)     Problem List Patient Active Problem List   Diagnosis Date Noted  . Dehydration 03/14/2019  . Gross hematuria 05/12/2018  . Sick sinus syndrome ([place]) 07/08/2016  . Goals of care, counseling/discussion 07/08/2016  . 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 Chronic fatigue   . 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 PAD (peripheral artery disease) ([place])   . OSA on CPAP   . TIA (transient ischemic attack) 11/02/2015  . Chronic diastolic CHF (congestive heart failure) ([place]) 09/20/2015  . CLL (chronic lymphocytic leukemia) ([place])   . PTSD (post-traumatic stress disorder)   . Osteoarthritis of both knees 07/05/2015  . COPD (chronic obstructive pulmonary disease) ([place]) 01/01/2012  . Shortness of breath 10/09/2011  . Paroxysmal atrial fibrillation ([place]) 10/09/2011  . Hyperlipidemia 11/28/2009  . Essential hypertension 11/28/2009   10:13 AM, 04/11/19 Etta Grandchild,  PT, DPT Physical Therapist - [facility]  Outpatient Physical Therapy- [place] [phone]     Etta Grandchild 04/11/2019, 9:37 AM  [place] MAIN [facility] SERVICES [address] [place], [place], [address] Phone: [phone]   Fax:  [phone]  Name: [name] MRN: [id number] Date of Birth: 1946/07/09

## 2019-04-13 ENCOUNTER — Other Ambulatory Visit: Payer: Self-pay

## 2019-04-13 ENCOUNTER — Ambulatory Visit: Payer: Medicare HMO

## 2019-04-13 DIAGNOSIS — R262 Difficulty in walking, not elsewhere classified: Secondary | ICD-10-CM | POA: Diagnosis not present

## 2019-04-13 DIAGNOSIS — M6281 Muscle weakness (generalized): Secondary | ICD-10-CM | POA: Diagnosis not present

## 2019-04-13 NOTE — Therapy (Addendum)
[place] MAIN [facility] SERVICES [address] [place], [place], [address] Phone: [phone]   Fax:  [phone]  Physical Therapy Treatment  Patient Details  Name: [name] MRN: [id number] Date of Birth: 1946-06-23 Referring Provider (PT): Cindra Presume   Encounter Date: 04/13/2019  PT End of Session - 04/13/19 0939    Visit Number  7    Number of Visits  17    Date for PT Re-Evaluation  05/03/19    PT Start Time  0932    PT Stop Time  1012    PT Time Calculation (min)  40 min    Equipment Utilized During Treatment  Gait belt    Activity Tolerance  Patient tolerated treatment well;No increased pain    Behavior During Therapy  WFL for tasks assessed/performed       Past Medical History:  Diagnosis Date  . Anxiety   . Arthritis   . Atrial fibrillation ([place])    a. Dx 2013, recurred 02/2014, CHA2DS2VASc = 3 -->placed on Eliquis;  b. 02/2014 Echo: EF 50-55%, mid and apical anterior septum and mid and apical inf septum are abnl, mild to mod Ao sclerosis w/o AS.  Marland Kitchen Chicken pox   . Chronic lymphocytic leukemia ([place])    a. Dx 02/2014.  Marland Kitchen Complication of anesthesia    History of  PTSD--do not touch patient when waking up from surgery.  Marland Kitchen COPD (chronic obstructive pulmonary disease) ([place])   . Coronary artery disease    a. 04/2009 CABG x 3 (LIMA->LAD, VG->OM1, VG->PDA);  b. 09/2009 Cath: occluded VG x 2 w/ patent LIMA and L->R collats. EF 55%, mild antlat HK;  c. 10/2011 MV: EF 53%, no isch/infarct-->low risk.  Marland Kitchen Dysrhythmia    hx of a-fib  . GERD (gastroesophageal reflux disease)    occasional  . History of chemotherapy 2015-2016  . HOH (hard of hearing)    Bilateral Hearing Aids  . Hypertension   . Myocardial infarction ([place]) 2010  . OSA on CPAP    USE C-PAP  . Presence of permanent cardiac pacemaker 2017  . PTSD (post-traumatic stress disorder)   . PTSD (post-traumatic stress disorder)   . Pure hypercholesterolemia   .  Rheumatic fever 1959  . 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 ABDOMINAL HERNIA REPAIR    . APPENDECTOMY  06/21/1985  . CARDIAC CATHETERIZATION  2010; 2011   ; Dr Fletcher Anon  . CORONARY ARTERY BYPASS GRAFT  04/2009   "CABG X3"  . EP IMPLANTABLE DEVICE N/A 03/03/2016   Procedure: Pacemaker Implant;  Surgeon: Deboraha Sprang, MD;  Location: [place] [facility];  Service: Cardiovascular;  Laterality: N/A;  . FOREIGN BODY REMOVAL  1968   "shrapnel in my tailbone"  . INGUINAL HERNIA REPAIR Right   . INSERT / REPLACE / REMOVE PACEMAKER    . JOINT REPLACEMENT Right 2018  . LAPAROSCOPIC CHOLECYSTECTOMY    . TONSILLECTOMY AND ADENOIDECTOMY  1956  . TOTAL HIP ARTHROPLASTY Right 10/22/2016   Procedure: TOTAL HIP ARTHROPLASTY;  Surgeon: Dereck Leep, MD;  Location: [facility] ORS;  Service: Orthopedics;  Laterality: Right;  . TOTAL HIP ARTHROPLASTY Left 11/04/2017   Procedure: TOTAL HIP ARTHROPLASTY;  Surgeon: Dereck Leep, MD;  Location: [facility] ORS;  Service: Orthopedics;  Laterality: Left;    There were no vitals filed for this visit.  Subjective Assessment - 04/13/19 [id number]  Subjective  Pt says he's doing fine today. He felt fine after last PT session 2DA. Pt reports his right leg cramping pain is much worse this date.    Pertinent History  Patient is having muscle pain in BLE and calfs. He is having pain that comes and goes but never goes away all the way. He is having balance difficulty and he is stumbling. He is on a blood thinner and had abdominal anersum.    Currently in Pain?  Yes    Pain Score  10-Worst pain ever    Pain Location  --   right leg cramping      Treatment: Octane fitness x 5 mins L2, moderate level aerobic exercise monitored for safety d/t AAA  (Leg press 105 lbs x 20 x 3) deferred 2/2 pt leg pain complaints sound potentially lumbar spine related and this Pryor Curia is not familiar enough with patient to clear for  safety. Will defer to evaluating therapist.  -heel raises --> 2x15 standing, BUE support on // bars   -Matrix x2 reps 12. 5 lbs: FWD, Left, Right, BAckward 1  Trip each. (red mat on floor for simulated surface change; RUE use walking stick)  *CGA-MinA for balance, cues to slow down and for correct posture  Standing on blue foam with feet apart, feet together,  -tapping left and right from foam single UE support on bar x20 -tapping 4 inch surface x20  -head turns left and right with CGA and minimal UE assist 15x bilat -vertical head turns x 15 -Stepping over blue 1/2 foam fwd/bwd, side to side with RUE assist via walking stick -Stepping over hurdle fwd/bwd, side to side with UE assist x 10 -box walking pattern on red foam mat, 5 squares, RUE support with walking stick; minGuard assist   PT Short Term Goals - 03/08/19 1806      PT SHORT TERM GOAL #1   Title  Patient will be independent in home exercise program to improve strength/mobility for better functional independence with ADLs.    Time  4    Period  Weeks    Status  New    Target Date  04/05/19      PT SHORT TERM GOAL #2   Title  Patient (< 39 years old) will complete five times sit to stand test in < 10 seconds indicating an increased LE strength and improved balance.    Time  4    Period  Weeks    Status  New    Target Date  04/05/19        PT Long Term Goals - 03/08/19 1809      PT LONG TERM GOAL #1   Title  Patient (> 79 years old) will complete five times sit to stand test in < 15 seconds indicating an increased LE strength and improved balance.    Time  8    Period  Weeks    Status  New    Target Date  05/03/19      PT LONG TERM GOAL #2   Title  Patient will increase Berg Balance score by > 6 points to demonstrate decreased fall risk during functional activities.    Time  8    Period  Weeks    Status  New    Target Date  05/03/19      PT LONG TERM GOAL #3   Title  Patient will increase 10 meter walk test  to >1.22m/s as to improve gait speed  for better community ambulation and to reduce fall risk.    Time  8    Period  Weeks    Status  New    Target Date  05/03/19            Plan - 04/13/19 0941    Clinical Impression Statement  In general, patient demonstrating fair tolerance to therapy session this date, reasonable accommodations are alllowed in-session to allow adequate rest between activities as needed, largely based on pt's RR, as he is heavily motivated to proceed without rest in spite of pain. Author maintains prudence in safety and RPE 2/2 PMH: AAA. Pt arrives with 10/10 pain in RLE, nature not entirely to author, however pt this session and previous refuses to sit due to pain associated with transition from sitting to standing. AS this is the case, and leg pain is suspected to have some lumbar spine origination, leg press is avoided this date. Pt demonstrates focused motivation to fully participate in therapy to the best of ability. Pt is given more dynamic and gait-based balance training this date and encouraged to use walking stick in session rather than fixed objects as he uses stick as primary means of balance at home outside. Pt continues to make steady progress toward most goals. No home exercise updates made at this time.    Stability/Clinical Decision Making  Evolving/Moderate complexity    Rehab Potential  Fair    PT Frequency  2x / week    PT Duration  8 weeks    PT Treatment/Interventions  Gait training;Therapeutic activities;Balance training;Therapeutic exercise;Patient/family education    PT Next Visit Plan  strengthening and balance    PT Home Exercise Plan  heel raises, hip abd, hip ext YTB    Consulted and Agree with Plan of Care  Patient       Patient will benefit from skilled therapeutic intervention in order to improve the following deficits and impairments:  Abnormal gait, Decreased balance, Decreased mobility, Difficulty walking, Cardiopulmonary status limiting  activity, Decreased activity tolerance, Decreased strength, Pain  Visit Diagnosis: Difficulty in walking, not elsewhere classified  Muscle weakness (generalized)     Problem List Patient Active Problem List   Diagnosis Date Noted  . Dehydration 03/14/2019  . Gross hematuria 05/12/2018  . Sick sinus syndrome ([place]) 07/08/2016  . Goals of care, counseling/discussion 07/08/2016  . 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 Chronic fatigue   . 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 PAD (peripheral artery disease) ([place])   . OSA on CPAP   . TIA (transient ischemic attack) 11/02/2015  . Chronic diastolic CHF (congestive heart failure) ([place]) 09/20/2015  . CLL (chronic lymphocytic leukemia) ([place])   . PTSD (post-traumatic stress disorder)   . Osteoarthritis of both knees 07/05/2015  . COPD (chronic obstructive pulmonary disease) ([place]) 01/01/2012  . Shortness of breath 10/09/2011  . Paroxysmal atrial fibrillation ([place]) 10/09/2011  . Hyperlipidemia 11/28/2009  . Essential hypertension 11/28/2009   9:54 AM, 04/13/19 Etta Grandchild, PT, DPT Physical Therapist - [facility]  Outpatient Physical Therapy- [place] [phone]     Etta Grandchild 04/13/2019, 9:47 AM  [place] MAIN [facility] SERVICES [address] [place], [place], [address] Phone: [phone]   Fax:  [phone]  Name: [name] MRN: [id number] Date of Birth: [birth year]

## 2019-04-15 ENCOUNTER — Encounter: Payer: Self-pay | Admitting: Internal Medicine

## 2019-04-15 ENCOUNTER — Other Ambulatory Visit: Payer: Self-pay

## 2019-04-15 NOTE — Progress Notes (Signed)
Patient pre screened for office appointment, no questions or concerns today. 

## 2019-04-17 ENCOUNTER — Other Ambulatory Visit: Payer: Self-pay | Admitting: Internal Medicine

## 2019-04-18 ENCOUNTER — Other Ambulatory Visit: Payer: Self-pay | Admitting: Internal Medicine

## 2019-04-18 ENCOUNTER — Inpatient Hospital Stay: Payer: Medicare HMO

## 2019-04-18 ENCOUNTER — Ambulatory Visit: Payer: Medicare HMO

## 2019-04-18 ENCOUNTER — Inpatient Hospital Stay (HOSPITAL_BASED_OUTPATIENT_CLINIC_OR_DEPARTMENT_OTHER): Payer: Medicare HMO | Admitting: Internal Medicine

## 2019-04-18 ENCOUNTER — Encounter: Payer: Self-pay | Admitting: Physical Therapy

## 2019-04-18 ENCOUNTER — Other Ambulatory Visit: Payer: Self-pay

## 2019-04-18 VITALS — BP 133/73 | HR 66 | Temp 98.1°F | Resp 16 | Wt 227.0 lb

## 2019-04-18 DIAGNOSIS — Z7901 Long term (current) use of anticoagulants: Secondary | ICD-10-CM | POA: Diagnosis not present

## 2019-04-18 DIAGNOSIS — R233 Spontaneous ecchymoses: Secondary | ICD-10-CM | POA: Diagnosis not present

## 2019-04-18 DIAGNOSIS — R262 Difficulty in walking, not elsewhere classified: Secondary | ICD-10-CM | POA: Diagnosis not present

## 2019-04-18 DIAGNOSIS — R0602 Shortness of breath: Secondary | ICD-10-CM

## 2019-04-18 DIAGNOSIS — Z79899 Other long term (current) drug therapy: Secondary | ICD-10-CM | POA: Diagnosis not present

## 2019-04-18 DIAGNOSIS — Z87891 Personal history of nicotine dependence: Secondary | ICD-10-CM | POA: Diagnosis not present

## 2019-04-18 DIAGNOSIS — C9110 Chronic lymphocytic leukemia of B-cell type not having achieved remission: Secondary | ICD-10-CM

## 2019-04-18 DIAGNOSIS — R269 Unspecified abnormalities of gait and mobility: Secondary | ICD-10-CM | POA: Diagnosis not present

## 2019-04-18 DIAGNOSIS — M6281 Muscle weakness (generalized): Secondary | ICD-10-CM

## 2019-04-18 DIAGNOSIS — I4891 Unspecified atrial fibrillation: Secondary | ICD-10-CM | POA: Diagnosis not present

## 2019-04-18 DIAGNOSIS — Z5112 Encounter for antineoplastic immunotherapy: Secondary | ICD-10-CM | POA: Diagnosis not present

## 2019-04-18 DIAGNOSIS — R252 Cramp and spasm: Secondary | ICD-10-CM | POA: Diagnosis not present

## 2019-04-18 LAB — CBC WITH DIFFERENTIAL/PLATELET
Abs Immature Granulocytes: 0.07 10*3/uL (ref 0.00–0.07)
Basophils Absolute: 0 10*3/uL (ref 0.0–0.1)
Basophils Relative: 0 %
Eosinophils Absolute: 0 10*3/uL (ref 0.0–0.5)
Eosinophils Relative: 0 %
HCT: 34.2 % — ABNORMAL LOW (ref 39.0–52.0)
Hemoglobin: 11.3 g/dL — ABNORMAL LOW (ref 13.0–17.0)
Immature Granulocytes: 1 %
Lymphocytes Relative: 26 %
Lymphs Abs: 2.2 10*3/uL (ref 0.7–4.0)
MCH: 32.6 pg (ref 26.0–34.0)
MCHC: 33 g/dL (ref 30.0–36.0)
MCV: 98.6 fL (ref 80.0–100.0)
Monocytes Absolute: 0.8 10*3/uL (ref 0.1–1.0)
Monocytes Relative: 10 %
Neutro Abs: 5.4 10*3/uL (ref 1.7–7.7)
Neutrophils Relative %: 63 %
Platelets: 118 10*3/uL — ABNORMAL LOW (ref 150–400)
RBC: 3.47 MIL/uL — ABNORMAL LOW (ref 4.22–5.81)
RDW: 15.4 % (ref 11.5–15.5)
WBC: 8.5 10*3/uL (ref 4.0–10.5)
nRBC: 0 % (ref 0.0–0.2)

## 2019-04-18 LAB — COMPREHENSIVE METABOLIC PANEL
ALT: 24 U/L (ref 0–44)
AST: 21 U/L (ref 15–41)
Albumin: 3.6 g/dL (ref 3.5–5.0)
Alkaline Phosphatase: 84 U/L (ref 38–126)
Anion gap: 7 (ref 5–15)
BUN: 17 mg/dL (ref 8–23)
CO2: 23 mmol/L (ref 22–32)
Calcium: 8.5 mg/dL — ABNORMAL LOW (ref 8.9–10.3)
Chloride: 107 mmol/L (ref 98–111)
Creatinine, Ser: 0.94 mg/dL (ref 0.61–1.24)
GFR calc Af Amer: >60 mL/min (ref >60)
GFR calc non Af Amer: >60 mL/min (ref >60)
Glucose, Bld: 180 mg/dL — ABNORMAL HIGH (ref 70–99)
Potassium: 3.6 mmol/L (ref 3.5–5.1)
Sodium: 137 mmol/L (ref 135–145)
Total Bilirubin: 1.2 mg/dL (ref 0.3–1.2)
Total Protein: 6.1 g/dL — ABNORMAL LOW (ref 6.5–8.1)

## 2019-04-18 LAB — LACTATE DEHYDROGENASE: LDH: 157 U/L (ref 98–192)

## 2019-04-18 LAB — CK: Total CK: 63 U/L (ref 49–397)

## 2019-04-18 LAB — URIC ACID: Uric Acid, Serum: 2.6 mg/dL — ABNORMAL LOW (ref 3.7–8.6)

## 2019-04-18 LAB — MAGNESIUM: Magnesium: 2 mg/dL (ref 1.7–2.4)

## 2019-04-18 LAB — PHOSPHORUS: Phosphorus: 3.4 mg/dL (ref 2.5–4.6)

## 2019-04-18 MED ORDER — VENCLEXTA 100 MG PO TABS: 200.0000 mg | ORAL_TABLET | Freq: Every day | ORAL | 6 refills | Status: DC

## 2019-04-18 NOTE — Progress Notes (Signed)
Script sent  

## 2019-04-18 NOTE — Therapy (Signed)
[place] MAIN [facility] SERVICES [address] [place], [place], [address] Phone: [phone]   Fax:  [phone]  Physical Therapy Treatment  Patient Details  Name: [name] MRN: [id number] Date of Birth: 1946/02/18 Referring Provider (PT): Cindra Presume   Encounter Date: 04/18/2019  PT End of Session - 04/18/19 1112    Visit Number  8    Number of Visits  17    Date for PT Re-Evaluation  05/03/19    PT Start Time  [id number]    PT Stop Time  1145    PT Time Calculation (min)  40 min    Equipment Utilized During Treatment  Gait belt    Activity Tolerance  Patient tolerated treatment well;No increased pain    Behavior During Therapy  WFL for tasks assessed/performed       Past Medical History:  Diagnosis Date  . Anxiety   . Arthritis   . Atrial fibrillation ([place])    a. Dx 2013, recurred 02/2014, CHA2DS2VASc = 3 -->placed on Eliquis;  b. 02/2014 Echo: EF 50-55%, mid and apical anterior septum and mid and apical inf septum are abnl, mild to mod Ao sclerosis w/o AS.  Marland Kitchen Chicken pox   . Chronic lymphocytic leukemia ([place])    a. Dx 02/2014.  Marland Kitchen Complication of anesthesia    History of  PTSD--do not touch patient when waking up from surgery.  Marland Kitchen COPD (chronic obstructive pulmonary disease) ([place])   . Coronary artery disease    a. 04/2009 CABG x 3 (LIMA->LAD, VG->OM1, VG->PDA);  b. 09/2009 Cath: occluded VG x 2 w/ patent LIMA and L->R collats. EF 55%, mild antlat HK;  c. 10/2011 MV: EF 53%, no isch/infarct-->low risk.  Marland Kitchen Dysrhythmia    hx of a-fib  . GERD (gastroesophageal reflux disease)    occasional  . History of chemotherapy 2015-2016  . HOH (hard of hearing)    Bilateral Hearing Aids  . Hypertension   . Myocardial infarction ([place]) 2010  . OSA on CPAP    USE C-PAP  . Presence of permanent cardiac pacemaker 2017  . PTSD (post-traumatic stress disorder)   . PTSD (post-traumatic stress disorder)   . Pure hypercholesterolemia   .  Rheumatic fever 1959  . 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 ABDOMINAL HERNIA REPAIR    . APPENDECTOMY  06/21/1985  . CARDIAC CATHETERIZATION  2010; 2011   ; Dr Fletcher Anon  . CORONARY ARTERY BYPASS GRAFT  04/2009   "CABG X3"  . EP IMPLANTABLE DEVICE N/A 03/03/2016   Procedure: Pacemaker Implant;  Surgeon: Deboraha Sprang, MD;  Location: [place] CV LAB;  Service: Cardiovascular;  Laterality: N/A;  . FOREIGN BODY REMOVAL  1968   "shrapnel in my tailbone"  . INGUINAL HERNIA REPAIR Right   . INSERT / REPLACE / REMOVE PACEMAKER    . JOINT REPLACEMENT Right 2018  . LAPAROSCOPIC CHOLECYSTECTOMY    . TONSILLECTOMY AND ADENOIDECTOMY  1956  . TOTAL HIP ARTHROPLASTY Right 10/22/2016   Procedure: TOTAL HIP ARTHROPLASTY;  Surgeon: Dereck Leep, MD;  Location: [facility] ORS;  Service: Orthopedics;  Laterality: Right;  . TOTAL HIP ARTHROPLASTY Left 11/04/2017   Procedure: TOTAL HIP ARTHROPLASTY;  Surgeon: Dereck Leep, MD;  Location: [facility] ORS;  Service: Orthopedics;  Laterality: Left;    There were no vitals filed for this visit.  Subjective Assessment - 04/18/19 1109  Subjective  Patient reported he had some very painful days on Friday and Saturday. Stated he has had some cramping this morning, but it is mostly just pain, L leg > R leg today.    Pertinent History  Patient is having muscle pain in BLE and calfs. He is having pain that comes and goes but never goes away all the way. He is having balance difficulty and he is stumbling. He is on a blood thinner and had abdominal anersum.    Currently in Pain?  Yes    Pain Score  9     Pain Location  --   bilateral legs   Pain Orientation  Right;Left    Pain Descriptors / Indicators  Aching    Pain Type  Chronic pain       Treatment: Therapeutic exercises: Octane fitness x 4 mins L2, moderate level aerobic exercise monitored for safety d/t AAA  5 times sit to stand, recorded  for goal -heel raises --> 2x15 standing, BUE support on // bars    Nuero-muscular re-education: -Matrix x2 reps 12. 5 lbs: FWD, Left, Right, Backward 1  Trip each. (red mat on floor for simulated surface change; RUE use walking stick)  *CGA-MinA for balance, cues to slow down and for correct posture   Standing on blue foam with feet apart, feet together,  -tapping left and right from foam single UE support on bar x20 -tapping 4 inch surface x20  -head turns left and right with CGA and minimal UE assist 15x bilat -vertical head turns x 15 -Stepping over hurdle fwd/bwd, side to side with UE assist x 10  Pt response/clinical impression: The patient was motivated today to minimize standing/sitting rest breaks, 2-3 standing rest breaks needed but overall the patient was able to continue working. CGA provided throughout session, able to progress program by decreasing UE support with certain exercises today. The patient would benefit from further skilled PT to continue to progress towards goals.    PT Education - 04/18/19 1110    Education Details  therepeutic exercise/form    Person(s) Educated  Patient    Methods  Explanation;Demonstration;Tactile cues;Verbal cues    Comprehension  Verbalized understanding;Returned demonstration;Need further instruction       PT Short Term Goals - 04/18/19 1118      PT SHORT TERM GOAL #1   Title  Patient will be independent in home exercise program to improve strength/mobility for better functional independence with ADLs.    Baseline  Patient partially independent, needs further updating for his HEP.    Time  4    Period  Weeks    Status  On-going    Target Date  05/03/19      PT SHORT TERM GOAL #2   Title  Patient (< 57 years old) will complete five times sit to stand test in < 10 seconds indicating an increased LE strength and improved balance.    Baseline  04/18/2019: 22secs    Time  4    Period  Weeks    Status  New    Target Date  05/03/19         PT Long Term Goals - 03/08/19 1809      PT LONG TERM GOAL #1   Title  Patient (> 67 years old) will complete five times sit to stand test in < 15 seconds indicating an increased LE strength and improved balance.    Time  8    Period  Weeks  Status  New    Target Date  05/03/19      PT LONG TERM GOAL #2   Title  Patient will increase Berg Balance score by > 6 points to demonstrate decreased fall risk during functional activities.    Time  8    Period  Weeks    Status  New    Target Date  05/03/19      PT LONG TERM GOAL #3   Title  Patient will increase 10 meter walk test to >1.6m/s as to improve gait speed for better community ambulation and to reduce fall risk.    Time  8    Period  Weeks    Status  New    Target Date  05/03/19            Plan - 04/18/19 1111    Clinical Impression Statement  The patient was motivated today to minimize standing/sitting rest breaks, 2-3 standing rest breaks needed but overall the patient was able to continue working. CGA provided throughout session, able to progress program by decreasing UE support with certain exercises today. The patient would benefit from further skilled PT to continue to progress towards goals.    Personal Factors and Comorbidities  Age;Comorbidity 1    Comorbidities  COPD,    Examination-Participation Restrictions  Yard Work;Cleaning;Community Activity    Stability/Clinical Decision Making  Evolving/Moderate complexity    Rehab Potential  Fair    PT Frequency  2x / week    PT Duration  8 weeks    PT Treatment/Interventions  Gait training;Therapeutic activities;Balance training;Therapeutic exercise;Patient/family education    PT Next Visit Plan  strengthening and balance    PT Home Exercise Plan  heel raises, hip abd, hip ext YTB    Consulted and Agree with Plan of Care  Patient       Patient will benefit from skilled therapeutic intervention in order to improve the following deficits and impairments:   Abnormal gait, Decreased balance, Decreased mobility, Difficulty walking, Cardiopulmonary status limiting activity, Decreased activity tolerance, Decreased strength, Pain  Visit Diagnosis: Difficulty in walking, not elsewhere classified  Muscle weakness (generalized)     Problem List Patient Active Problem List   Diagnosis Date Noted  . Dehydration 03/14/2019  . Gross hematuria 05/12/2018  . Sick sinus syndrome ([place]) 07/08/2016  . Goals of care, counseling/discussion 07/08/2016  . 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 Chronic fatigue   . 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 PAD (peripheral artery disease) ([place])   . OSA on CPAP   . TIA (transient ischemic attack) 11/02/2015  . Chronic diastolic CHF (congestive heart failure) ([place]) 09/20/2015  . CLL (chronic lymphocytic leukemia) ([place])   . PTSD (post-traumatic stress disorder)   . Osteoarthritis of both knees 07/05/2015  . COPD (chronic obstructive pulmonary disease) ([place]) 01/01/2012  . Shortness of breath 10/09/2011  . Paroxysmal atrial fibrillation ([place]) 10/09/2011  . Hyperlipidemia 11/28/2009  . Essential hypertension 11/28/2009    Lieutenant Diego PT, DPT 12:58 PM,04/18/19 [place] MAIN [facility] SERVICES [address] [place], [place], [address] Phone: [phone]   Fax:  [phone]  Name: [name] MRN: [id number] Date of Birth: May 05, 1946

## 2019-04-18 NOTE — Patient Instructions (Signed)
#  Hold Lipitor until the next visit.

## 2019-04-18 NOTE — Assessment & Plan Note (Addendum)
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 Currently s/p 4 cycles of Gazyva.   # continue Venatoclax -day-21; 100 mg/day [sec to amiodarone-goal 200 mg a day; starting 9/7]; today white count is 11. hemoglobin 118 platelets- 134. Will increase the dose to 200 mg/day.   #Worsening fatigue/joint pains-question etiology.  Hold Lipitor 80 mg.  Check CK.  # A.fib-on amiodarone sinus rhythm-on Eliquis-STABLE.   # TLS/infectious Prophylaxis-allopurinol; Bactrim/acyclovir.  Stable.   DISPOSITION: add CK levels today # follow up in Oct 15th- MD- labs- cbc/cmp/ldh/mag phos/uric acid;Gazyva-  Dr.B

## 2019-04-18 NOTE — Progress Notes (Signed)
[place] OFFICE PROGRESS NOTE  Patient Care Team: Leone Haven, MD as PCP - General (Family Medicine) [name], MD as Consulting Physician (Cardiology) Cammie Sickle, MD as Medical Oncologist (Hematology and Oncology)  Cancer Staging No matching staging information was found for the patient.   Oncology History Overview Note  # AUG 2015- SLL/CLL [Right Ax Ln Bx] s/p Benda-Rituxan x6 [finished March 2016]; Maintenance Rituxan q 67m[started April 2016; Dr.Pandit];Last Ritux Jan 2017.  MARCH 2017- CT N/C/A/P- NED. STOP Ritux; surveillance   # AUG 2019- CT/PET- progression/NO transformation;  # NOV 2019- Progression; started Ibrutinib 420 mg/d. STOPPED in end of feb sec to extreme fatigue/joint pains/cramps  #November 01, 2018-start ibrutinib 280 mg a day; December 20, 2018-discontinue ibrutinib secondary multiple side effects.   #January 03, 2019 start [place][July]    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; IGVH- UN-MUTATED [poor prognosis]  DIAGNOSIS: CLL  STAGE: IV  ;GOALS: control  CURRENT/MOST RECENT THERAPY : Gazyva + venetoclax [pending]    CLL (chronic lymphocytic leukemia) ([place]  01/03/2019 -  Chemotherapy   The patient had obinutuzumab (GAZYVA) 100 mg in sodium chloride 0.9 % 100 mL (0.9615 mg/mL) chemo infusion, 100 mg, Intravenous, Once, 4 of 6 cycles Administration: 100 mg (01/03/2019), 900 mg (01/04/2019), 1,000 mg (01/10/2019), 1,000 mg (01/31/2019), 1,000 mg (01/17/2019), 1,000 mg (02/28/2019), 1,000 mg (04/07/2019)  for chemotherapy treatment.      INTERVAL HISTORY:  Michael [id number].  male CLL-high risk currently on GDyann Kiefis here for follow-up; currently status post 3 cycles is here for follow-up.  Patient currently on venetoclax modified ramp-up.  He started  taking 100 mg once a day for the last 2 weeks.  Patient also worsening cramping in his legs.  Complains of worsening arthritic pain.  Complains of fatigue.  Otherwise denies any blood in stools or black-colored stools  Review of Systems  Constitutional: Positive for malaise/fatigue. Negative for chills, diaphoresis and fever.  HENT: Negative for nosebleeds and sore throat.   Eyes: Negative for double vision.  Respiratory: Negative for cough, hemoptysis, sputum production, shortness of breath and wheezing.   Cardiovascular: Negative for chest pain, palpitations, orthopnea and leg swelling.  Gastrointestinal: Negative for abdominal pain, blood in stool, constipation, diarrhea, heartburn, melena, nausea and vomiting.  Genitourinary: Negative for dysuria, frequency and urgency.  Musculoskeletal: Positive for back pain and joint pain.  Skin: Negative.  Negative for itching and rash.  Neurological: Negative for dizziness, tingling, focal weakness and headaches.  Psychiatric/Behavioral: Negative for depression. The patient is not nervous/anxious and does not have insomnia.       PAST MEDICAL HISTORY :  Past Medical History:  Diagnosis Date  . Anxiety   . Arthritis   . Atrial fibrillation ([place]    a. Dx 2013, recurred 02/2014, CHA2DS2VASc = 3 -->placed on Eliquis;  b. 02/2014 Echo: EF 50-55%, mid and apical anterior septum and mid and apical inf septum are abnl, mild to mod Ao sclerosis w/o AS.  .Marland KitchenChicken pox   . Chronic lymphocytic leukemia ([place]    a. Dx 02/2014.  .Marland KitchenComplication of anesthesia    History of  PTSD--do not touch patient when waking up from surgery.  .Marland KitchenCOPD (chronic obstructive pulmonary disease) ([place]   . Coronary artery disease  a. 04/2009 CABG x 3 (LIMA->LAD, VG->OM1, VG->PDA);  b. 09/2009 Cath: occluded VG x 2 w/ patent LIMA and L->R collats. EF 55%, mild antlat HK;  c. 10/2011 MV: EF 53%, no isch/infarct-->low risk.  Marland Kitchen Dysrhythmia    hx of a-fib  . GERD (gastroesophageal  reflux disease)    occasional  . History of chemotherapy 2015-2016  . HOH (hard of hearing)    Bilateral Hearing Aids  . Hypertension   . Myocardial infarction ([place]) 2010  . OSA on CPAP    USE C-PAP  . Presence of permanent cardiac pacemaker 2017  . PTSD (post-traumatic stress disorder)   . PTSD (post-traumatic stress disorder)   . Pure hypercholesterolemia   . Rheumatic fever 1959  . 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 ABDOMINAL HERNIA REPAIR    . APPENDECTOMY  06/21/1985  . CARDIAC CATHETERIZATION  2010; 2011   ; Dr Fletcher Anon  . CORONARY ARTERY BYPASS GRAFT  04/2009   "CABG X3"  . EP IMPLANTABLE DEVICE N/A 03/03/2016   Procedure: Pacemaker Implant;  Surgeon: Deboraha Sprang, MD;  Location: [place] CV LAB;  Service: Cardiovascular;  Laterality: N/A;  . FOREIGN BODY REMOVAL  1968   "shrapnel in my tailbone"  . INGUINAL HERNIA REPAIR Right   . INSERT / REPLACE / REMOVE PACEMAKER    . JOINT REPLACEMENT Right 2018  . LAPAROSCOPIC CHOLECYSTECTOMY    . TONSILLECTOMY AND ADENOIDECTOMY  1956  . TOTAL HIP ARTHROPLASTY Right 10/22/2016   Procedure: TOTAL HIP ARTHROPLASTY;  Surgeon: Dereck Leep, MD;  Location: [facility] ORS;  Service: Orthopedics;  Laterality: Right;  . TOTAL HIP ARTHROPLASTY Left 11/04/2017   Procedure: TOTAL HIP ARTHROPLASTY;  Surgeon: Dereck Leep, MD;  Location: [facility] ORS;  Service: Orthopedics;  Laterality: Left;    FAMILY HISTORY :   Family History  Problem Relation Age of Onset  . Heart disease Mother   . Heart attack Mother   . Coronary artery disease Other        family history    SOCIAL HISTORY:   Social History   Tobacco Use  . Smoking status: Former Smoker    Packs/day: 1.00    Years: 40.00    Pack years: 40.00    Types: Cigarettes    Quit date: 07/21/2006    Years since quitting: 12.7  . Smokeless tobacco: Never Used  Substance Use Topics  .  Alcohol use: Yes    Alcohol/week: 1.0 standard drinks    Types: 1 Standard drinks or equivalent per week    Comment: rarely  . Drug use: No    ALLERGIES:  has No Known Allergies.  MEDICATIONS:  Current Outpatient Medications  Medication Sig Dispense Refill  . acetaminophen (TYLENOL) 500 MG tablet Take 1,000 mg by mouth every 8 (eight) hours as needed for mild pain.    Marland Kitchen albuterol (PROVENTIL HFA;VENTOLIN HFA) 108 (90 Base) MCG/ACT inhaler Inhale 2 puffs into the lungs every 6 (six) hours as needed for wheezing or shortness of breath. 1 Inhaler 11  . allopurinol (ZYLOPRIM) 300 MG tablet Take 1 tablet by mouth twice daily 120 tablet 3  . amiodarone (PACERONE) 200 MG tablet TAKE 1/2 TABLET (100 mg)  BY MOUTH TWICE DAILY 90 tablet 2  . apixaban (ELIQUIS) 5 MG TABS tablet Take 5 mg by mouth 2 (two) times daily.    Marland Kitchen atorvastatin (LIPITOR)  80 MG tablet Take 1 tablet (80 mg total) by mouth at bedtime. 90 tablet 3  . budesonide-formoterol (SYMBICORT) 160-4.5 MCG/ACT inhaler Inhale 2 puffs into the lungs 2 (two) times daily. 1 Inhaler 11  . cetirizine (ZYRTEC) 10 MG tablet Take 10 mg by mouth daily as needed for allergies.     . Coenzyme Q10 (COQ10) 200 MG CAPS Take 200 mg by mouth daily.    Marland Kitchen dexamethasone (DECADRON) 4 MG tablet Start 2 days prior to infusion; Take for 2 days. Do not take on the day of infusion. 60 tablet 3  . ezetimibe (ZETIA) 10 MG tablet Take 1 tablet (10 mg total) by mouth daily. 90 tablet 3  . furosemide (LASIX) 20 MG tablet Take 1 tablet (20 mg) by mouth twice daily as needed    . Krill Oil 350 MG CAPS Take 350 mg by mouth every evening.    . metoprolol tartrate (LOPRESSOR) 25 MG tablet Take 25 mg by mouth 2 (two) times daily.    . mirtazapine (REMERON) 15 MG tablet Take 15 mg by mouth at bedtime as needed (for panic associated with PTSD).     Marland Kitchen montelukast (SINGULAIR) 10 MG tablet Take 1 tablet (10 mg total) by mouth at bedtime. Start 2 days prior to infusion. Take it for 4  days. 60 tablet 0  . Multiple Vitamin (MULTIVITAMIN WITH MINERALS) TABS tablet Take 1 tablet by mouth daily.    . mupirocin ointment (BACTROBAN) 2 % Apply 1 application topically 3 (three) times daily. 30 g 1  . nitroGLYCERIN (NITROSTAT) 0.4 MG SL tablet Place 1 tablet (0.4 mg total) under the tongue every 5 (five) minutes as needed for chest pain. 25 tablet 6  . tiotropium (SPIRIVA) 18 MCG inhalation capsule Place 1 capsule (18 mcg total) into inhaler and inhale at bedtime. 30 capsule 11  . traMADol (ULTRAM) 50 MG tablet TAKE 1 TABLET BY MOUTH EVERY 12 HOURS AS NEEDED FOR MODERATE PAIN 90 tablet 0  . venetoclax 10 & 50 & 100 MG TBPK Take by mouth daily. Dose escalate weekly to goal dose of '200mg'$     . acyclovir (ZOVIRAX) 400 MG tablet TAKE 1 TABLET BY MOUTH ONCE DAILY TO  PREVENT  SHINGLES 30 tablet 0   No current facility-administered medications for this visit.     PHYSICAL EXAMINATION: ECOG PERFORMANCE STATUS: 1 - Symptomatic but completely ambulatory  BP 133/73 (BP Location: Left Arm, Patient Position: Sitting, Cuff Size: Normal)   Pulse 66   Temp 98.1 F (36.7 C) (Tympanic)   Resp 16   Wt 227 lb (103 kg)   SpO2 97%   BMI 33.52 kg/m   Filed Weights   04/18/19 1343  Weight: 227 lb (103 kg)    Physical Exam  Constitutional: He is oriented to person, place, and time and well-developed, well-nourished, and in no distress.  Patient is alone.  HENT:  Head: Normocephalic and atraumatic.  Mouth/Throat: Oropharynx is clear and moist. No oropharyngeal exudate.  Eyes: Pupils are equal, round, and reactive to light.  Neck: Normal range of motion. Neck supple.  Cardiovascular: Normal rate and regular rhythm.  Pulmonary/Chest: No respiratory distress. He has no wheezes.  Abdominal: Soft. Bowel sounds are normal. He exhibits no distension and no mass. There is no abdominal tenderness. There is no rebound and no guarding.  Musculoskeletal: Normal range of motion.        General: No  tenderness or edema.  Lymphadenopathy:  Resolved lymph nodes in the neck  underarms.  Neurological: He is alert and oriented to person, place, and time.  Skin: Skin is warm.  Psychiatric: Affect normal.    LABORATORY DATA:  I have reviewed the data as listed    Component Value Date/Time   NA 137 04/18/2019 1310   NA 139 10/11/2014 1800   K 3.6 04/18/2019 1310   K 3.3 (L) 10/11/2014 1800   CL 107 04/18/2019 1310   CL 106 10/11/2014 1800   CO2 23 04/18/2019 1310   CO2 27 10/11/2014 1800   GLUCOSE 180 (H) 04/18/2019 1310   GLUCOSE 107 (H) 10/11/2014 1800   BUN 17 04/18/2019 1310   BUN 15 10/11/2014 1800   CREATININE 0.94 04/18/2019 1310   CREATININE 0.89 10/11/2014 1800   CALCIUM 8.5 (L) 04/18/2019 1310   CALCIUM 8.8 (L) 10/11/2014 1800   PROT 6.1 (L) 04/18/2019 1310   PROT 6.7 05/18/2017 1048   PROT 6.4 (L) 10/11/2014 1800   ALBUMIN 3.6 04/18/2019 1310   ALBUMIN 4.3 05/18/2017 1048   ALBUMIN 4.1 10/11/2014 1800   AST 21 04/18/2019 1310   AST 23 10/11/2014 1800   ALT 24 04/18/2019 1310   ALT 22 10/11/2014 1800   ALKPHOS 84 04/18/2019 1310   ALKPHOS 61 10/11/2014 1800   BILITOT 1.2 04/18/2019 1310   BILITOT 0.7 05/18/2017 1048   BILITOT 0.9 10/11/2014 1800   GFRNONAA >60 04/18/2019 1310   GFRNONAA >60 10/11/2014 1800   GFRAA >60 04/18/2019 1310   GFRAA >60 10/11/2014 1800    No results found for: SPEP, UPEP  Lab Results  Component Value Date   WBC 8.5 04/18/2019   NEUTROABS 5.4 04/18/2019   HGB 11.3 (L) 04/18/2019   HCT 34.2 (L) 04/18/2019   MCV 98.6 04/18/2019   PLT 118 (L) 04/18/2019      Chemistry      Component Value Date/Time   NA 137 04/18/2019 1310   NA 139 10/11/2014 1800   K 3.6 04/18/2019 1310   K 3.3 (L) 10/11/2014 1800   CL 107 04/18/2019 1310   CL 106 10/11/2014 1800   CO2 23 04/18/2019 1310   CO2 27 10/11/2014 1800   BUN 17 04/18/2019 1310   BUN 15 10/11/2014 1800   CREATININE 0.94 04/18/2019 1310   CREATININE 0.89 10/11/2014 1800       Component Value Date/Time   CALCIUM 8.5 (L) 04/18/2019 1310   CALCIUM 8.8 (L) 10/11/2014 1800   ALKPHOS 84 04/18/2019 1310   ALKPHOS 61 10/11/2014 1800   AST 21 04/18/2019 1310   AST 23 10/11/2014 1800   ALT 24 04/18/2019 1310   ALT 22 10/11/2014 1800   BILITOT 1.2 04/18/2019 1310   BILITOT 0.7 05/18/2017 1048   BILITOT 0.9 10/11/2014 1800       RADIOGRAPHIC STUDIES: I have personally reviewed the radiological images as listed and agreed with the findings in the report. No results found.   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 Currently s/p 4 cycles of Gazyva.   # continue Venatoclax -day-21; 100 mg/day [sec to amiodarone-goal 200 mg a day; starting 9/7]; today white count is 11. hemoglobin 118 platelets- 134. Will increase the dose to 200 mg/day.   #Worsening fatigue/joint pains-question etiology.  Hold Lipitor 80 mg.  Check CK.  # A.fib-on amiodarone sinus rhythm-on Eliquis-STABLE.   # TLS/infectious Prophylaxis-allopurinol; Bactrim/acyclovir.  Stable.   DISPOSITION: add CK levels today # follow  up in Oct 15th- MD- labs- cbc/cmp/ldh/mag phos/uric acid;Gazyva-  Dr.B     Orders Placed This Encounter  Procedures  . CK    Standing Status:   Future    Number of Occurrences:   1    Standing Expiration Date:   04/17/2020  . CBC with Differential    Standing Status:   Future    Standing Expiration Date:   04/17/2020  . Comprehensive metabolic panel    Standing Status:   Future    Standing Expiration Date:   04/17/2020  . Uric acid    Standing Status:   Future    Standing Expiration Date:   04/17/2020  . Magnesium    Standing Status:   Future    Standing Expiration Date:   04/17/2020  . Lactate dehydrogenase    Standing Status:   Future    Standing Expiration Date:   04/17/2020   All questions were answered. The patient knows to  call the clinic with any problems, questions or concerns.      Cammie Sickle, MD 04/18/2019 9:24 PM

## 2019-04-20 ENCOUNTER — Other Ambulatory Visit: Payer: Self-pay

## 2019-04-20 ENCOUNTER — Encounter: Payer: Self-pay | Admitting: Physical Therapy

## 2019-04-20 ENCOUNTER — Ambulatory Visit: Payer: Medicare HMO | Admitting: Physical Therapy

## 2019-04-20 DIAGNOSIS — R262 Difficulty in walking, not elsewhere classified: Secondary | ICD-10-CM | POA: Diagnosis not present

## 2019-04-20 DIAGNOSIS — M6281 Muscle weakness (generalized): Secondary | ICD-10-CM | POA: Diagnosis not present

## 2019-04-20 MED FILL — VENCLEXTA 100 MG TABS: 100 | 30 days supply | Qty: 60 | Fill #0

## 2019-04-20 NOTE — Therapy (Signed)
[place] MAIN [facility] SERVICES [address] [place], [place], [address] Phone: [phone]   Fax:  [phone]  Physical Therapy Treatment  Patient Details  Name: [name] MRN: [id number] Date of Birth: 1946/03/18 Referring Provider (PT): Cindra Presume   Encounter Date: 04/20/2019  PT End of Session - 04/20/19 0930    Visit Number  9    Number of Visits  17    Date for PT Re-Evaluation  05/03/19    PT Start Time  0930    PT Stop Time  1010    PT Time Calculation (min)  40 min    Equipment Utilized During Treatment  Gait belt    Activity Tolerance  Patient tolerated treatment well;No increased pain    Behavior During Therapy  WFL for tasks assessed/performed       Past Medical History:  Diagnosis Date  . Anxiety   . Arthritis   . Atrial fibrillation ([place])    a. Dx 2013, recurred 02/2014, CHA2DS2VASc = 3 -->placed on Eliquis;  b. 02/2014 Echo: EF 50-55%, mid and apical anterior septum and mid and apical inf septum are abnl, mild to mod Ao sclerosis w/o AS.  Marland Kitchen Chicken pox   . Chronic lymphocytic leukemia ([place])    a. Dx 02/2014.  Marland Kitchen Complication of anesthesia    History of  PTSD--do not touch patient when waking up from surgery.  Marland Kitchen COPD (chronic obstructive pulmonary disease) ([place])   . Coronary artery disease    a. 04/2009 CABG x 3 (LIMA->LAD, VG->OM1, VG->PDA);  b. 09/2009 Cath: occluded VG x 2 w/ patent LIMA and L->R collats. EF 55%, mild antlat HK;  c. 10/2011 MV: EF 53%, no isch/infarct-->low risk.  Marland Kitchen Dysrhythmia    hx of a-fib  . GERD (gastroesophageal reflux disease)    occasional  . History of chemotherapy 2015-2016  . HOH (hard of hearing)    Bilateral Hearing Aids  . Hypertension   . Myocardial infarction ([place]) 2010  . OSA on CPAP    USE C-PAP  . Presence of permanent cardiac pacemaker 2017  . PTSD (post-traumatic stress disorder)   . PTSD (post-traumatic stress disorder)   . Pure hypercholesterolemia   .  Rheumatic fever 1959  . 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 ABDOMINAL HERNIA REPAIR    . APPENDECTOMY  06/21/1985  . CARDIAC CATHETERIZATION  2010; 2011   ; Dr Fletcher Anon  . CORONARY ARTERY BYPASS GRAFT  04/2009   "CABG X3"  . EP IMPLANTABLE DEVICE N/A 03/03/2016   Procedure: Pacemaker Implant;  Surgeon: Deboraha Sprang, MD;  Location: [facility];  Service: Cardiovascular;  Laterality: N/A;  . FOREIGN BODY REMOVAL  1968   "shrapnel in my tailbone"  . INGUINAL HERNIA REPAIR Right   . INSERT / REPLACE / REMOVE PACEMAKER    . JOINT REPLACEMENT Right 2018  . LAPAROSCOPIC CHOLECYSTECTOMY    . TONSILLECTOMY AND ADENOIDECTOMY  1956  . TOTAL HIP ARTHROPLASTY Right 10/22/2016   Procedure: TOTAL HIP ARTHROPLASTY;  Surgeon: Dereck Leep, MD;  Location: [facility];  Service: Orthopedics;  Laterality: Right;  . TOTAL HIP ARTHROPLASTY Left 11/04/2017   Procedure: TOTAL HIP ARTHROPLASTY;  Surgeon: Dereck Leep, MD;  Location: [facility];  Service: Orthopedics;  Laterality: Left;    There were no vitals filed for this visit.  Subjective Assessment - 04/20/19 [id number]  Subjective  Patient reports that he is doing better with his balance and he is not stumbling as much    Pertinent History  Patient is having pain in BLE legs , but it never goes away. He is able to do his HEP.    Currently in Pain?  Yes    Pain Score  5     Pain Location  Leg    Pain Orientation  Left;Right    Pain Descriptors / Indicators  Aching    Pain Onset  More than a month ago    Pain Frequency  Constant    Aggravating Factors   na    Pain Relieving Factors  na    Effect of Pain on Daily Activities  na               Treatment: Therapeutic exercises: Octane fitness x 5 mins L2, moderate level aerobic exercise monitored for safety d/t AAA 5 times sit to stand, recorded for goal heel raises-->2x15 standing, BUE support on // bars  Leg  press 100 lbs x 20 x 3   Nuero-muscular re-education: Balance beam : side stepping with  Min use of UE for support x 5 laps Rocker board side to side and fwd/bwd x 20 each direction and CGA  Standing on blue foam with feet apart, feet together,foot up on one step tapping left and right from foamsingle UE support on bar x20 tapping 4 inch surfacex20  head turns left and right with CGA and minimal UE assist 15x bilat vertical head turns x 15 Stepping over hurdle fwd/bwd, side to side with UE assist x 10 Trunk rotation left and right             Pt educated throughout session about proper posture and technique with exercises. Improved exercise technique, movement at target joints, use of target muscles after min to       mod verbal, visual, tactile cues.  Pt response  The patient was motivated today to minimize standing/sitting rest breaks, 1 standing rest breaks needed but overall the patient was able to continue working. CGA provided throughout session, able to progress program by decreasing UE support with certain exercises today. The patient would benefit from further skilled PT to continue to progress towards goals.                       PT Education - 04/20/19 0929    Education Details  HEP    Person(s) Educated  Patient    Methods  Explanation    Comprehension  Verbalized understanding;Returned demonstration;Tactile cues required       PT Short Term Goals - 04/18/19 1118      PT SHORT TERM GOAL #1   Title  Patient will be independent in home exercise program to improve strength/mobility for better functional independence with ADLs.    Baseline  Patient partially independent, needs further updating for his HEP.    Time  4    Period  Weeks    Status  On-going    Target Date  05/03/19      PT SHORT TERM GOAL #2   Title  Patient (< 62 years old) will complete five times sit to stand test in < 10 seconds indicating an increased LE strength and improved  balance.    Baseline  04/18/2019: 22secs    Time  4    Period  Weeks    Status  New    Target  Date  05/03/19        PT Long Term Goals - 03/08/19 1809      PT LONG TERM GOAL #1   Title  Patient (> 63 years old) will complete five times sit to stand test in < 15 seconds indicating an increased LE strength and improved balance.    Time  8    Period  Weeks    Status  New    Target Date  05/03/19      PT LONG TERM GOAL #2   Title  Patient will increase Berg Balance score by > 6 points to demonstrate decreased fall risk during functional activities.    Time  8    Period  Weeks    Status  New    Target Date  05/03/19      PT LONG TERM GOAL #3   Title  Patient will increase 10 meter walk test to >1.66m/s as to improve gait speed for better community ambulation and to reduce fall risk.    Time  8    Period  Weeks    Status  New    Target Date  05/03/19            Plan - 04/20/19 0936    Clinical Impression Statement  Pt presents with unsteadiness on uneven surfaces and fatigues with therapeutic exercises. Patient needs assist with instruction for balance with side stepping on uneven surfaces and needs CGA assist with balance activities. Patient demonstrates difficulty with side stepping and navigating small spaces with decreased base of support and increased challenges for LE.  Patient tolerated all interventions well this date and will benefit from continued skilled PT interventions to improve strength and balance and decrease risk of falling    Personal Factors and Comorbidities  Age;Comorbidity 1    Comorbidities  COPD,    Examination-Participation Restrictions  Yard Work;Cleaning;Community Activity    Stability/Clinical Decision Making  Evolving/Moderate complexity    Rehab Potential  Fair    PT Frequency  2x / week    PT Duration  8 weeks    PT Treatment/Interventions  Gait training;Therapeutic activities;Balance training;Therapeutic exercise;Patient/family education     PT Next Visit Plan  strengthening and balance    PT Home Exercise Plan  heel raises, hip abd, hip ext YTB    Consulted and Agree with Plan of Care  Patient       Patient will benefit from skilled therapeutic intervention in order to improve the following deficits and impairments:  Abnormal gait, Decreased balance, Decreased mobility, Difficulty walking, Cardiopulmonary status limiting activity, Decreased activity tolerance, Decreased strength, Pain  Visit Diagnosis: Difficulty in walking, not elsewhere classified  Muscle weakness (generalized)     Problem List Patient Active Problem List   Diagnosis Date Noted  . Dehydration 03/14/2019  . Gross hematuria 05/12/2018  . Sick sinus syndrome ([place]) 07/08/2016  . Goals of care, counseling/discussion 07/08/2016  . 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 Chronic fatigue   . 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 PAD (peripheral artery disease) ([place])   . OSA on CPAP   . TIA (transient ischemic attack) 11/02/2015  . Chronic diastolic CHF (congestive heart failure) ([place]) 09/20/2015  . CLL (chronic lymphocytic leukemia) ([place])   . PTSD (post-traumatic stress disorder)   . Osteoarthritis of both knees 07/05/2015  . COPD (chronic obstructive pulmonary disease) ([place]) 01/01/2012  . Shortness of breath 10/09/2011  . Paroxysmal atrial fibrillation ([facility])  10/09/2011  . Hyperlipidemia 11/28/2009  . Essential hypertension 11/28/2009    Alanson Puls, PT DPT 04/20/2019, 9:38 AM  [place] MAIN [facility] SERVICES [address] [place], [place], [address] Phone: [phone]   Fax:  [phone]  Name: [name] MRN: [id number] Date of Birth: 1946-05-21

## 2019-04-25 ENCOUNTER — Ambulatory Visit: Payer: Medicare HMO | Attending: Internal Medicine | Admitting: Physical Therapy

## 2019-04-25 ENCOUNTER — Encounter: Payer: Self-pay | Admitting: Physical Therapy

## 2019-04-25 ENCOUNTER — Other Ambulatory Visit: Payer: Self-pay

## 2019-04-25 DIAGNOSIS — R262 Difficulty in walking, not elsewhere classified: Secondary | ICD-10-CM | POA: Diagnosis not present

## 2019-04-25 DIAGNOSIS — M6281 Muscle weakness (generalized): Secondary | ICD-10-CM | POA: Insufficient documentation

## 2019-04-25 NOTE — Therapy (Signed)
[place] MAIN [facility] SERVICES [address] [place], [place], [address] Phone: [phone]   Fax:  [phone]  Physical Therapy Treatment/ Physical Therapy Progress Note   Dates of reporting period  03/08/19   to   04/25/19  Patient Details  Name: [name] MRN: [id number] Date of Birth: 1945/11/09 Referring Provider (PT): Cindra Presume   Encounter Date: 04/25/2019  PT End of Session - 04/25/19 0940    Visit Number  10    Number of Visits  17    Date for PT Re-Evaluation  05/03/19    PT Start Time  0930    PT Stop Time  1015    PT Time Calculation (min)  45 min    Equipment Utilized During Treatment  Gait belt    Activity Tolerance  Patient tolerated treatment well;No increased pain    Behavior During Therapy  WFL for tasks assessed/performed       Past Medical History:  Diagnosis Date  . Anxiety   . Arthritis   . Atrial fibrillation ([place])    a. Dx 2013, recurred 02/2014, CHA2DS2VASc = 3 -->placed on Eliquis;  b. 02/2014 Echo: EF 50-55%, mid and apical anterior septum and mid and apical inf septum are abnl, mild to mod Ao sclerosis w/o AS.  Marland Kitchen Chicken pox   . Chronic lymphocytic leukemia ([place])    a. Dx 02/2014.  Marland Kitchen Complication of anesthesia    History of  PTSD--do not touch patient when waking up from surgery.  Marland Kitchen COPD (chronic obstructive pulmonary disease) ([place])   . Coronary artery disease    a. 04/2009 CABG x 3 (LIMA->LAD, VG->OM1, VG->PDA);  b. 09/2009 Cath: occluded VG x 2 w/ patent LIMA and L->R collats. EF 55%, mild antlat HK;  c. 10/2011 MV: EF 53%, no isch/infarct-->low risk.  Marland Kitchen Dysrhythmia    hx of a-fib  . GERD (gastroesophageal reflux disease)    occasional  . History of chemotherapy 2015-2016  . HOH (hard of hearing)    Bilateral Hearing Aids  . Hypertension   . Myocardial infarction ([place]) 2010  . OSA on CPAP    USE C-PAP  . Presence of permanent cardiac pacemaker 2017  . PTSD (post-traumatic stress  disorder)   . PTSD (post-traumatic stress disorder)   . Pure hypercholesterolemia   . Rheumatic fever 1959  . 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 ABDOMINAL HERNIA REPAIR    . APPENDECTOMY  06/21/1985  . CARDIAC CATHETERIZATION  2010; 2011   ; Dr Fletcher Anon  . CORONARY ARTERY BYPASS GRAFT  04/2009   "CABG X3"  . EP IMPLANTABLE DEVICE N/A 03/03/2016   Procedure: Pacemaker Implant;  Surgeon: Deboraha Sprang, MD;  Location: [facility];  Service: Cardiovascular;  Laterality: N/A;  . FOREIGN BODY REMOVAL  1968   "shrapnel in my tailbone"  . INGUINAL HERNIA REPAIR Right   . INSERT / REPLACE / REMOVE PACEMAKER    . JOINT REPLACEMENT Right 2018  . LAPAROSCOPIC CHOLECYSTECTOMY    . TONSILLECTOMY AND ADENOIDECTOMY  1956  . TOTAL HIP ARTHROPLASTY Right 10/22/2016   Procedure: TOTAL HIP ARTHROPLASTY;  Surgeon: Dereck Leep, MD;  Location: [facility];  Service: Orthopedics;  Laterality: Right;  . TOTAL HIP ARTHROPLASTY Left 11/04/2017   Procedure: TOTAL HIP ARTHROPLASTY;  Surgeon: Dereck Leep, MD;  Location: [facility];  Service: Orthopedics;  Laterality: Left;  There were no vitals filed for this visit.  Subjective Assessment - 04/25/19 0938    Subjective  Patient reports that he is doing better with his balance and he is not stumbling as much    Pertinent History  Patient is having pain in BLE legs , but it never goes away. He is able to do his HEP.    Currently in Pain?  Yes    Pain Score  7     Pain Location  Leg    Pain Orientation  Right;Left    Pain Descriptors / Indicators  Aching;Cramping    Pain Onset  More than a month ago         [facility] PT Assessment - 04/25/19 0001      Berg Balance Test   Sit to Stand  Able to stand  independently using hands    Standing Unsupported  Able to stand safely 2 minutes    Sitting with Back Unsupported but Feet Supported on Floor or Stool  Able to sit safely and  securely 2 minutes    Stand to Sit  Sits safely with minimal use of hands    Transfers  Able to transfer safely, minor use of hands    Standing Unsupported with Eyes Closed  Able to stand 10 seconds safely    Standing Unsupported with Feet Together  Able to place feet together independently and stand for 1 minute with supervision    From Standing, Reach Forward with Outstretched Arm  Can reach forward >12 cm safely (5")    From Standing Position, Pick up Object from Floor  Able to pick up shoe, needs supervision    From Standing Position, Turn to Look Behind Over each Shoulder  Looks behind from both sides and weight shifts well    Turn 360 Degrees  Able to turn 360 degrees safely one side only in 4 seconds or less    Standing Unsupported, Alternately Place Feet on Step/Stool  Able to stand independently and complete 8 steps >20 seconds    Standing Unsupported, One Foot in Front  Able to place foot tandem independently and hold 30 seconds    Standing on One Leg  Able to lift leg independently and hold 5-10 seconds    Total Score  49        Treatment; Patient performs Berg balance test, 10MW test, 5 x sit to stand test with improvements in gait speed and berg balance test  Neuromuscular training:   Neuromuscular Re-education  Tandem gait on balance beam  without UE support x 2 lengths; Side stepping on balance beam, foam without UE support x 2 lengths; Heel/toe raises without UE support 3s hold x 10 each; 1/2 foam roll balance with flat side up 30s x 2; 1/2 foam roll tandem balance alternating forward LE 30s x 2 each LE forward   Pt educated throughout session about proper posture and technique with exercises. Improved exercise technique, movement at target joints, use of target muscles after min to mod verbal, visual, tactile cues.                 PT Education - 04/25/19 0940    Education Details  HEP    Person(s) Educated  Patient    Methods  Explanation;Demonstration     Comprehension  Verbalized understanding;Returned demonstration;Need further instruction       PT Short Term Goals - 04/18/19 1118      PT SHORT TERM GOAL #1   Title  Patient will be independent in home exercise program to improve strength/mobility for better functional independence with ADLs.    Baseline  Patient partially independent, needs further updating for his HEP.    Time  4    Period  Weeks    Status  On-going    Target Date  05/03/19      PT SHORT TERM GOAL #2   Title  Patient (< 13 years old) will complete five times sit to stand test in < 10 seconds indicating an increased LE strength and improved balance.    Baseline  04/18/2019: 22secs    Time  4    Period  Weeks    Status  New    Target Date  05/03/19        PT Long Term Goals - 04/25/19 0941      PT LONG TERM GOAL #1   Title  Patient (> 82 years old) will complete five times sit to stand test in < 15 seconds indicating an increased LE strength and improved balance.    Baseline  04/25/19=44 .07sec    Time  8    Period  Weeks    Status  Partially Met    Target Date  06/28/19      PT LONG TERM GOAL #2   Title  Patient will increase Berg Balance score by > 6 points to demonstrate decreased fall risk during functional activities.    Baseline  04/25/19=49/56    Time  8    Period  Weeks    Status  Partially Met    Target Date  06/28/19      PT LONG TERM GOAL #3   Title  Patient will increase 10 meter walk test to >1.87ms as to improve gait speed for better community ambulation and to reduce fall risk.    Baseline  04/25/19=.71 m/sec    Time  8    Period  Weeks    Status  Partially Met    Target Date  06/28/19            Plan - 04/25/19 [id number]   Clinical Impression Statement  Patient's condition has the potential to improve in response to therapy. Maximum improvement is yet to be obtained. The anticipated improvement is attainable and reasonable in a generally predictable time.  Patient reports that he  is not stumbling as often. He has made improvements in berg balance from 39/56 to 49/56 and his gait speed is improving. He will continue to benefit from skilled PT to improve strength, mobility and safety.    Personal Factors and Comorbidities  Age;Comorbidity 1    Comorbidities  COPD,    Examination-Participation Restrictions  Yard Work;Cleaning;Community Activity    Stability/Clinical Decision Making  Evolving/Moderate complexity    Rehab Potential  Fair    PT Frequency  2x / week    PT Duration  8 weeks    PT Treatment/Interventions  Gait training;Therapeutic activities;Balance training;Therapeutic exercise;Patient/family education    PT Next Visit Plan  strengthening and balance    PT Home Exercise Plan  heel raises, hip abd, hip ext YTB    Consulted and Agree with Plan of Care  Patient       Patient will benefit from skilled therapeutic intervention in order to improve the following deficits and impairments:  Abnormal gait, Decreased balance, Decreased mobility, Difficulty walking, Cardiopulmonary status limiting activity, Decreased activity tolerance, Decreased strength, Pain  Visit Diagnosis: Difficulty in walking, not elsewhere classified  Muscle weakness (generalized)     Problem List Patient Active Problem List   Diagnosis Date Noted  . Dehydration 03/14/2019  . Gross hematuria 05/12/2018  . Sick sinus syndrome ([place]) 07/08/2016  . Goals of care, counseling/discussion 07/08/2016  . 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 Chronic fatigue   . 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 PAD (peripheral artery disease) ([place])   . OSA on CPAP   . TIA (transient ischemic attack) 11/02/2015  . Chronic diastolic CHF (congestive heart failure) ([place]) 09/20/2015  . CLL (chronic lymphocytic leukemia) ([place])   . PTSD (post-traumatic stress disorder)   . Osteoarthritis of both knees 07/05/2015  . COPD (chronic obstructive pulmonary disease) ([place])  01/01/2012  . Shortness of breath 10/09/2011  . Paroxysmal atrial fibrillation ([place]) 10/09/2011  . Hyperlipidemia 11/28/2009  . Essential hypertension 11/28/2009    Alanson Puls, PT DPT 04/25/2019, 9:59 AM  [place] MAIN [facility] SERVICES [address] [place], [place], [address] Phone: [phone]   Fax:  [phone]  Name: [name] MRN: [id number] Date of Birth: [birth year]

## 2019-04-25 NOTE — Telephone Encounter (Signed)
Oral Chemotherapy Pharmacist Encounter   Orders entered for venetoclax initiation.   Darl Pikes, PharmD, BCPS, BCOP Hematology/Oncology Clinical Pharmacist [facility]/AP Oral [facility] [phone]

## 2019-04-26 LAB — CUP PACEART INCLINIC DEVICE CHECK
Battery Remaining Longevity: 85 mo
Battery Voltage: 3.01 V
Brady Statistic AP VP Percent: 0.34 %
Brady Statistic AP VS Percent: 96.71 %
Brady Statistic AS VP Percent: 0 %
Brady Statistic AS VS Percent: 2.95 %
Brady Statistic RA Percent Paced: 97.03 %
Brady Statistic RV Percent Paced: 0.36 %
Date Time Interrogation Session: 20200901141356
Eval Rhythm: 1
Implantable Lead Implant Date: 20170814
Implantable Lead Implant Date: 20170814
Implantable Lead Location: 753859
Implantable Lead Location: 753860
Implantable Lead Model: 5076
Implantable Lead Model: 5076
Implantable Pulse Generator Implant Date: 20170814
Lead Channel Impedance Value: 323 Ohm
Lead Channel Impedance Value: 418 Ohm
Lead Channel Impedance Value: 494 Ohm
Lead Channel Impedance Value: 608 Ohm
Lead Channel Pacing Threshold Amplitude: 0.75 V
Lead Channel Pacing Threshold Amplitude: 0.75 V
Lead Channel Pacing Threshold Pulse Width: 0.4 ms
Lead Channel Pacing Threshold Pulse Width: 0.4 ms
Lead Channel Sensing Intrinsic Amplitude: 1.875 mV
Lead Channel Sensing Intrinsic Amplitude: 26.625 mV
Lead Channel Setting Pacing Amplitude: 2 V
Lead Channel Setting Pacing Amplitude: 2.5 V
Lead Channel Setting Pacing Pulse Width: 0.4 ms
Lead Channel Setting Sensing Sensitivity: 0.9 mV

## 2019-04-27 ENCOUNTER — Encounter: Payer: Self-pay | Admitting: Physical Therapy

## 2019-04-27 ENCOUNTER — Ambulatory Visit: Payer: Medicare HMO | Admitting: Physical Therapy

## 2019-04-27 ENCOUNTER — Other Ambulatory Visit: Payer: Self-pay

## 2019-04-27 DIAGNOSIS — M6281 Muscle weakness (generalized): Secondary | ICD-10-CM | POA: Diagnosis not present

## 2019-04-27 DIAGNOSIS — R262 Difficulty in walking, not elsewhere classified: Secondary | ICD-10-CM

## 2019-04-27 NOTE — Therapy (Addendum)
[facility] SERVICES [address] [place], [place], [address] Phone: [phone]   Fax:  [phone]  Physical Therapy Treatment  Patient Details  Name: [name] MRN: [id number] Date of Birth: 05-22-1946 Referring Provider (PT): Cindra Presume   Encounter Date: 04/27/2019  PT End of Session - 04/27/19 0945    Visit Number  11    Number of Visits  17    Date for PT Re-Evaluation  05/03/19    PT Start Time  0931    PT Stop Time  1010    PT Time Calculation (min)  39 min    Equipment Utilized During Treatment  Gait belt    Activity Tolerance  Patient tolerated treatment well;No increased pain    Behavior During Therapy  WFL for tasks assessed/performed       Past Medical History:  Diagnosis Date  . Anxiety   . Arthritis   . Atrial fibrillation ([place])    a. Dx 2013, recurred 02/2014, CHA2DS2VASc = 3 -->placed on Eliquis;  b. 02/2014 Echo: EF 50-55%, mid and apical anterior septum and mid and apical inf septum are abnl, mild to mod Ao sclerosis w/o AS.  Marland Kitchen Chicken pox   . Chronic lymphocytic leukemia ([place])    a. Dx 02/2014.  Marland Kitchen Complication of anesthesia    History of  PTSD--do not touch patient when waking up from surgery.  Marland Kitchen COPD (chronic obstructive pulmonary disease) ([place])   . Coronary artery disease    a. 04/2009 CABG x 3 (LIMA->LAD, VG->OM1, VG->PDA);  b. 09/2009 Cath: occluded VG x 2 w/ patent LIMA and L->R collats. EF 55%, mild antlat HK;  c. 10/2011 MV: EF 53%, no isch/infarct-->low risk.  Marland Kitchen Dysrhythmia    hx of a-fib  . GERD (gastroesophageal reflux disease)    occasional  . History of chemotherapy 2015-2016  . HOH (hard of hearing)    Bilateral Hearing Aids  . Hypertension   . Myocardial infarction ([place]) 2010  . OSA on CPAP    USE C-PAP  . Presence of permanent cardiac pacemaker 2017  . PTSD (post-traumatic stress disorder)   . PTSD (post-traumatic stress disorder)   . Pure hypercholesterolemia   .  Rheumatic fever 1959  . 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 ABDOMINAL HERNIA REPAIR    . APPENDECTOMY  06/21/1985  . CARDIAC CATHETERIZATION  2010; 2011   ; Dr Fletcher Anon  . CORONARY ARTERY BYPASS GRAFT  04/2009   "CABG X3"  . EP IMPLANTABLE DEVICE N/A 03/03/2016   Procedure: Pacemaker Implant;  Surgeon: Deboraha Sprang, MD;  Location: [facility];  Service: Cardiovascular;  Laterality: N/A;  . FOREIGN BODY REMOVAL  1968   "shrapnel in my tailbone"  . INGUINAL HERNIA REPAIR Right   . INSERT / REPLACE / REMOVE PACEMAKER    . JOINT REPLACEMENT Right 2018  . LAPAROSCOPIC CHOLECYSTECTOMY    . TONSILLECTOMY AND ADENOIDECTOMY  1956  . TOTAL HIP ARTHROPLASTY Right 10/22/2016   Procedure: TOTAL HIP ARTHROPLASTY;  Surgeon: Dereck Leep, MD;  Location: [facility];  Service: Orthopedics;  Laterality: Right;  . TOTAL HIP ARTHROPLASTY Left 11/04/2017   Procedure: TOTAL HIP ARTHROPLASTY;  Surgeon: Dereck Leep, MD;  Location: [facility];  Service: Orthopedics;  Laterality: Left;    There were no vitals filed for this visit.  Subjective Assessment - 04/27/19 9628  Subjective  Patient reports that he is doing better with his balance and he is not stumbling as much    Pertinent History  Patient is having pain in BLE legs , but it never goes away. He is able to do his HEP.    Currently in Pain?  No/denies    Pain Score  0-No pain    Pain Onset  More than a month ago       Treatment: Therapeutic exercises: [employer] fitness x 4 mins L2, moderate level aerobic exercise monitored for safety d/t AAA heel raises2x15 standing, BUE support on // bars   NEUro-muscular re-education: Matrix x2 reps12. 5 lbs: FWD, Left, Right, BackwardX 2 CGA-MinA for balance, cues to slow down and for correct posture  Standing on blue foam with feet apart, feet together, tapping left and right from foamsingle UE support on bar  x20 tapping 4 inch surfacex20  head turns left and right with CGA and minimal UE assist 15x bilat Stepping over 2 hurdle fwd/bwd, side to side with UE assist x 10   stepping side to side on airbeam x 5  Standing on 1/2 foam flat side up and flat side down x 2 mins , CGA  Pt educated throughout session about proper posture and technique with exercises. Improved exercise technique, movement at target joints, use of target muscles after min to mod verbal, visual, tactile cues.                     PT Education - 04/27/19 0939    Education Details  HEP    Person(s) Educated  Patient    Methods  Explanation;Demonstration;Tactile cues;Verbal cues    Comprehension  Verbalized understanding;Returned demonstration;Tactile cues required;Need further instruction       PT Short Term Goals - 04/18/19 1118      PT SHORT TERM GOAL #1   Title  Patient will be independent in home exercise program to improve strength/mobility for better functional independence with ADLs.    Baseline  Patient partially independent, needs further updating for his HEP.    Time  4    Period  Weeks    Status  On-going    Target Date  05/03/19      PT SHORT TERM GOAL #2   Title  Patient (< 55 years old) will complete five times sit to stand test in < 10 seconds indicating an increased LE strength and improved balance.    Baseline  04/18/2019: 22secs    Time  4    Period  Weeks    Status  New    Target Date  05/03/19        PT Long Term Goals - 04/25/19 0941      PT LONG TERM GOAL #1   Title  Patient (> 53 years old) will complete five times sit to stand test in < 15 seconds indicating an increased LE strength and improved balance.    Baseline  04/25/19=44 .07sec    Time  8    Period  Weeks    Status  Partially Met    Target Date  06/28/19      PT LONG TERM GOAL #2   Title  Patient will increase Berg Balance score by > 6 points to demonstrate decreased fall risk during functional  activities.    Baseline  04/25/19=49/56    Time  8    Period  Weeks    Status  Partially Met  Target Date  06/28/19      PT LONG TERM GOAL #3   Title  Patient will increase 10 meter walk test to >1.96ms as to improve gait speed for better community ambulation and to reduce fall risk.    Baseline  04/25/19=.71 m/sec    Time  8    Period  Weeks    Status  Partially Met    Target Date  06/28/19            Plan - 04/27/19 0945    Clinical Impression Statement  Pt presents with unsteadiness on uneven surfaces and fatigues with therapeutic exercises. Patient needs assist with instruction for balance with side stepping on uneven surfaces and needs CGA assist with balance activities. Patient demonstrates difficulty with side stepping and navigating small spaces with decreased base of support and increased challenges for LE.  Patient tolerated all interventions well this date and will benefit from continued skilled PT interventions to improve strength and balance and decrease risk of falling   Personal Factors and Comorbidities  Age;Comorbidity 1    Comorbidities  COPD,    Examination-Participation Restrictions  Yard Work;Cleaning;Community Activity    Stability/Clinical Decision Making  Evolving/Moderate complexity    Rehab Potential  Fair    PT Frequency  2x / week    PT Duration  8 weeks    PT Treatment/Interventions  Gait training;Therapeutic activities;Balance training;Therapeutic exercise;Patient/family education    PT Next Visit Plan  strengthening and balance    PT Home Exercise Plan  heel raises, hip abd, hip ext YTB    Consulted and Agree with Plan of Care  Patient       Patient will benefit from skilled therapeutic intervention in order to improve the following deficits and impairments:  Abnormal gait, Decreased balance, Decreased mobility, Difficulty walking, Cardiopulmonary status limiting activity, Decreased activity tolerance, Decreased strength, Pain  Visit  Diagnosis: Difficulty in walking, not elsewhere classified  Muscle weakness (generalized)     Problem List Patient Active Problem List   Diagnosis Date Noted  . Dehydration 03/14/2019  . Gross hematuria 05/12/2018  . Sick sinus syndrome ([place] 07/08/2016  . Goals of care, counseling/discussion 07/08/2016  . 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 Chronic fatigue   . 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 PAD (peripheral artery disease) ([place]   . OSA on CPAP   . TIA (transient ischemic attack) 11/02/2015  . Chronic diastolic CHF (congestive heart failure) ([place] 09/20/2015  . CLL (chronic lymphocytic leukemia) ([place]   . PTSD (post-traumatic stress disorder)   . Osteoarthritis of both knees 07/05/2015  . COPD (chronic obstructive pulmonary disease) ([place] 01/01/2012  . Shortness of breath 10/09/2011  . Paroxysmal atrial fibrillation ([place] 10/09/2011  . Hyperlipidemia 11/28/2009  . Essential hypertension 11/28/2009    MAlanson Puls PT DPT 04/27/2019, 9:47 AM  [facility] [address] [address]Phone: [phone]  Fax:  [phone] Name: TROSA GAMBALEMRN: [id number] of Birth: [birth year]

## 2019-05-02 ENCOUNTER — Ambulatory Visit: Payer: Medicare HMO | Admitting: Physical Therapy

## 2019-05-02 ENCOUNTER — Encounter: Payer: Self-pay | Admitting: Physical Therapy

## 2019-05-02 ENCOUNTER — Other Ambulatory Visit: Payer: Self-pay

## 2019-05-02 DIAGNOSIS — M6281 Muscle weakness (generalized): Secondary | ICD-10-CM

## 2019-05-02 DIAGNOSIS — R262 Difficulty in walking, not elsewhere classified: Secondary | ICD-10-CM

## 2019-05-02 NOTE — Therapy (Signed)
[facility] [facility] SERVICES [address] [place], [place], [address] Phone: [phone]   Fax:  [phone]  Physical Therapy Treatment  Patient Details  Name: [name] MRN: [id number] Date of Birth: 04-24-1946 Referring Provider (PT): Cindra Presume   Encounter Date: 05/02/2019  PT End of Session - 05/02/19 0937    Visit Number  12    Number of Visits  17    Date for PT Re-Evaluation  05/03/19    PT Start Time  0932    PT Stop Time  1010    PT Time Calculation (min)  38 min    Equipment Utilized During Treatment  Gait belt    Activity Tolerance  Patient tolerated treatment well;No increased pain    Behavior During Therapy  WFL for tasks assessed/performed       Past Medical History:  Diagnosis Date  . Anxiety   . Arthritis   . Atrial fibrillation ([place])    a. Dx 2013, recurred 02/2014, CHA2DS2VASc = 3 -->placed on Eliquis;  b. 02/2014 Echo: EF 50-55%, mid and apical anterior septum and mid and apical inf septum are abnl, mild to mod Ao sclerosis w/o AS.  Marland Kitchen Chicken pox   . Chronic lymphocytic leukemia ([place])    a. Dx 02/2014.  Marland Kitchen Complication of anesthesia    History of  PTSD--do not touch patient when waking up from surgery.  Marland Kitchen COPD (chronic obstructive pulmonary disease) ([place])   . Coronary artery disease    a. 04/2009 CABG x 3 (LIMA->LAD, VG->OM1, VG->PDA);  b. 09/2009 Cath: occluded VG x 2 w/ patent LIMA and L->R collats. EF 55%, mild antlat HK;  c. 10/2011 MV: EF 53%, no isch/infarct-->low risk.  Marland Kitchen Dysrhythmia    hx of a-fib  . GERD (gastroesophageal reflux disease)    occasional  . History of chemotherapy 2015-2016  . HOH (hard of hearing)    Bilateral Hearing Aids  . Hypertension   . Myocardial infarction ([place]) 2010  . OSA on CPAP    USE C-PAP  . Presence of permanent cardiac pacemaker 2017  . PTSD (post-traumatic stress disorder)   . PTSD (post-traumatic stress disorder)   . Pure hypercholesterolemia   .  Rheumatic fever 1959  . 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 ABDOMINAL HERNIA REPAIR    . APPENDECTOMY  06/21/1985  . CARDIAC CATHETERIZATION  2010; 2011   ; Dr Fletcher Anon  . CORONARY ARTERY BYPASS GRAFT  04/2009   "CABG X3"  . EP IMPLANTABLE DEVICE N/A 03/03/2016   Procedure: Pacemaker Implant;  Surgeon: Deboraha Sprang, MD;  Location: [facility];  Service: Cardiovascular;  Laterality: N/A;  . FOREIGN BODY REMOVAL  1968   "shrapnel in my tailbone"  . INGUINAL HERNIA REPAIR Right   . INSERT / REPLACE / REMOVE PACEMAKER    . JOINT REPLACEMENT Right 2018  . LAPAROSCOPIC CHOLECYSTECTOMY    . TONSILLECTOMY AND ADENOIDECTOMY  1956  . TOTAL HIP ARTHROPLASTY Right 10/22/2016   Procedure: TOTAL HIP ARTHROPLASTY;  Surgeon: Dereck Leep, MD;  Location: [facility];  Service: Orthopedics;  Laterality: Right;  . TOTAL HIP ARTHROPLASTY Left 11/04/2017   Procedure: TOTAL HIP ARTHROPLASTY;  Surgeon: Dereck Leep, MD;  Location: [facility];  Service: Orthopedics;  Laterality: Left;    There were no vitals filed for this visit.  Subjective Assessment - 05/02/19 8889  Subjective  Patient reports that he is doing better with his balance and he is not stumbling as much    Pertinent History  Patient is having pain in BLE legs , but it never goes away. He is able to do his HEP.    Currently in Pain?  Yes    Pain Score  9     Pain Location  Knee    Pain Orientation  Left;Right    Pain Onset  More than a month ago       Ther-ex  Leg press 120 lbs x 20 x 3      Neuromuscular Re-education  Fwd stepping on 2 x 4 with out UE assist x 3 laps Side stepping on 2 x 4 without UE assist x 3 laps Side stepping on air beam without uE Assist x 3 laps  Tandem stepping on air foam with balloon taps to mirror x 2 mins  Modified tandem standing on foam with self ball toss x 20  Toe taps to 6" step without UE support alternating LE x15  each  Normal stance on Airex pad 2x60sec (requires minA for falls recovery 2 times)?  Forward and backward stepping over hurdle x15 each direction  SIde stepping over hurdle 15x each direction  Matrix 22. 5 lbs x 1 lap fwd/bwd/side to side   Pt educated throughout session about proper posture and technique with exercises. Improved exercise technique, movement at target joints, use of target muscles after min to mod verbal, visual, tactile cues.                       PT Education - 05/02/19 0937    Education Details  HEP    Person(s) Educated  Patient    Methods  Explanation;Demonstration;Tactile cues;Verbal cues    Comprehension  Verbalized understanding;Returned demonstration;Verbal cues required;Need further instruction       PT Short Term Goals - 04/18/19 1118      PT SHORT TERM GOAL #1   Title  Patient will be independent in home exercise program to improve strength/mobility for better functional independence with ADLs.    Baseline  Patient partially independent, needs further updating for his HEP.    Time  4    Period  Weeks    Status  On-going    Target Date  05/03/19      PT SHORT TERM GOAL #2   Title  Patient (< 51 years old) will complete five times sit to stand test in < 10 seconds indicating an increased LE strength and improved balance.    Baseline  04/18/2019: 22secs    Time  4    Period  Weeks    Status  New    Target Date  05/03/19        PT Long Term Goals - 04/25/19 0941      PT LONG TERM GOAL #1   Title  Patient (> 68 years old) will complete five times sit to stand test in < 15 seconds indicating an increased LE strength and improved balance.    Baseline  04/25/19=44 .07sec    Time  8    Period  Weeks    Status  Partially Met    Target Date  06/28/19      PT LONG TERM GOAL #2   Title  Patient will increase Berg Balance score by > 6 points to demonstrate decreased fall risk during functional activities.    Baseline  04/25/19=49/56  Time  8    Period  Weeks    Status  Partially Met    Target Date  06/28/19      PT LONG TERM GOAL #3   Title  Patient will increase 10 meter walk test to >1.68ms as to improve gait speed for better community ambulation and to reduce fall risk.    Baseline  04/25/19=.71 m/sec    Time  8    Period  Weeks    Status  Partially Met    Target Date  06/28/19            Plan - 05/02/19 [id number]   Clinical Impression Statement  Patient demonstrates improved stability and strength allowing patient to perform longer duration standing interventions with rest periods.  Patient performs beginning and intermediate standing dynamic standing balance exercises to shift weight and perform single leg standing activities with min assist. Patient fatigues quickly with exercises requiring rest breaks at this time. Patient will continue to benefit from skilled physical therapy to improve pain and mobility.    Personal Factors and Comorbidities  Age;Comorbidity 1    Comorbidities  COPD,    Examination-Participation Restrictions  Yard Work;Cleaning;Community Activity    Stability/Clinical Decision Making  Evolving/Moderate complexity    Rehab Potential  Fair    PT Frequency  2x / week    PT Duration  8 weeks    PT Treatment/Interventions  Gait training;Therapeutic activities;Balance training;Therapeutic exercise;Patient/family education    PT Next Visit Plan  strengthening and balance    PT Home Exercise Plan  heel raises, hip abd, hip ext YTB    Consulted and Agree with Plan of Care  Patient       Patient will benefit from skilled therapeutic intervention in order to improve the following deficits and impairments:  Abnormal gait, Decreased balance, Decreased mobility, Difficulty walking, Cardiopulmonary status limiting activity, Decreased activity tolerance, Decreased strength, Pain  Visit Diagnosis: Difficulty in walking, not elsewhere classified  Muscle weakness (generalized)     Problem  List Patient Active Problem List   Diagnosis Date Noted  . Dehydration 03/14/2019  . Gross hematuria 05/12/2018  . Sick sinus syndrome ([place] 07/08/2016  . Goals of care, counseling/discussion 07/08/2016  . 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 Chronic fatigue   . 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 PAD (peripheral artery disease) ([place]   . OSA on CPAP   . TIA (transient ischemic attack) 11/02/2015  . Chronic diastolic CHF (congestive heart failure) ([place] 09/20/2015  . CLL (chronic lymphocytic leukemia) ([place]   . PTSD (post-traumatic stress disorder)   . Osteoarthritis of both knees 07/05/2015  . COPD (chronic obstructive pulmonary disease) ([place] 01/01/2012  . Shortness of breath 10/09/2011  . Paroxysmal atrial fibrillation ([place] 10/09/2011  . Hyperlipidemia 11/28/2009  . Essential hypertension 11/28/2009    MAlanson Puls PT DPT 05/02/2019, 10:08 AM  [facility]MAIN [facility]ieraSERV[facility] [address] [address]Phone: [phone]  Fax:  [phone] Name: Michael CHRISMERMRN: [id number] of Birth: 110/24/1947

## 2019-05-04 ENCOUNTER — Other Ambulatory Visit: Payer: Self-pay

## 2019-05-04 ENCOUNTER — Encounter: Payer: Self-pay | Admitting: Internal Medicine

## 2019-05-04 NOTE — Progress Notes (Signed)
Pre-visit assessment completed prior to [place] appointment on 05/05/2019 with Dr. Rogue Bussing. Mr. Delarocha reports that he is feeling well and has no concerns.

## 2019-05-05 ENCOUNTER — Ambulatory Visit: Payer: Medicare HMO | Admitting: Physical Therapy

## 2019-05-05 ENCOUNTER — Encounter: Payer: Self-pay | Admitting: Internal Medicine

## 2019-05-05 ENCOUNTER — Other Ambulatory Visit: Payer: Self-pay

## 2019-05-05 ENCOUNTER — Inpatient Hospital Stay: Payer: Medicare HMO | Attending: Internal Medicine

## 2019-05-05 ENCOUNTER — Inpatient Hospital Stay: Payer: Medicare HMO

## 2019-05-05 ENCOUNTER — Inpatient Hospital Stay (HOSPITAL_BASED_OUTPATIENT_CLINIC_OR_DEPARTMENT_OTHER): Payer: Medicare HMO | Admitting: Internal Medicine

## 2019-05-05 VITALS — BP 152/76 | HR 65 | Temp 98.8°F | Resp 18

## 2019-05-05 DIAGNOSIS — C9110 Chronic lymphocytic leukemia of B-cell type not having achieved remission: Secondary | ICD-10-CM

## 2019-05-05 DIAGNOSIS — Z7189 Other specified counseling: Secondary | ICD-10-CM

## 2019-05-05 DIAGNOSIS — Z5112 Encounter for antineoplastic immunotherapy: Secondary | ICD-10-CM | POA: Insufficient documentation

## 2019-05-05 LAB — COMPREHENSIVE METABOLIC PANEL
ALT: 36 U/L (ref 0–44)
AST: 26 U/L (ref 15–41)
Albumin: 4 g/dL (ref 3.5–5.0)
Alkaline Phosphatase: 91 U/L (ref 38–126)
Anion gap: 9 (ref 5–15)
BUN: 17 mg/dL (ref 8–23)
CO2: 22 mmol/L (ref 22–32)
Calcium: 9 mg/dL (ref 8.9–10.3)
Chloride: 110 mmol/L (ref 98–111)
Creatinine, Ser: 0.92 mg/dL (ref 0.61–1.24)
GFR calc Af Amer: >60 mL/min (ref >60)
GFR calc non Af Amer: >60 mL/min (ref >60)
Glucose, Bld: 180 mg/dL — ABNORMAL HIGH (ref 70–99)
Potassium: 3.8 mmol/L (ref 3.5–5.1)
Sodium: 141 mmol/L (ref 135–145)
Total Bilirubin: 0.9 mg/dL (ref 0.3–1.2)
Total Protein: 6.5 g/dL (ref 6.5–8.1)

## 2019-05-05 LAB — CBC WITH DIFFERENTIAL/PLATELET
Abs Immature Granulocytes: 0.07 10*3/uL (ref 0.00–0.07)
Basophils Absolute: 0 10*3/uL (ref 0.0–0.1)
Basophils Relative: 0 %
Eosinophils Absolute: 0 10*3/uL (ref 0.0–0.5)
Eosinophils Relative: 0 %
HCT: 36.4 % — ABNORMAL LOW (ref 39.0–52.0)
Hemoglobin: 11.9 g/dL — ABNORMAL LOW (ref 13.0–17.0)
Immature Granulocytes: 1 %
Lymphocytes Relative: 21 %
Lymphs Abs: 2 10*3/uL (ref 0.7–4.0)
MCH: 32.2 pg (ref 26.0–34.0)
MCHC: 32.7 g/dL (ref 30.0–36.0)
MCV: 98.4 fL (ref 80.0–100.0)
Monocytes Absolute: 0.7 10*3/uL (ref 0.1–1.0)
Monocytes Relative: 8 %
Neutro Abs: 6.5 10*3/uL (ref 1.7–7.7)
Neutrophils Relative %: 70 %
Platelets: 189 10*3/uL (ref 150–400)
RBC: 3.7 MIL/uL — ABNORMAL LOW (ref 4.22–5.81)
RDW: 15 % (ref 11.5–15.5)
WBC: 9.3 10*3/uL (ref 4.0–10.5)
nRBC: 0 % (ref 0.0–0.2)

## 2019-05-05 LAB — URIC ACID: Uric Acid, Serum: 2.6 mg/dL — ABNORMAL LOW (ref 3.7–8.6)

## 2019-05-05 LAB — PHOSPHORUS: Phosphorus: 3.2 mg/dL (ref 2.5–4.6)

## 2019-05-05 LAB — MAGNESIUM: Magnesium: 2.1 mg/dL (ref 1.7–2.4)

## 2019-05-05 LAB — LACTATE DEHYDROGENASE: LDH: 159 U/L (ref 98–192)

## 2019-05-05 MED ORDER — ACETAMINOPHEN 325 MG PO TABS
650.0000 mg | ORAL_TABLET | Freq: Once | ORAL | Status: AC
  Administered 2019-05-05: 10:00:00 650 mg via ORAL
  Filled 2019-05-05: qty 2

## 2019-05-05 MED ORDER — SODIUM CHLORIDE 0.9 % IV SOLN
20.0000 mg | Freq: Once | INTRAVENOUS | Status: AC
  Administered 2019-05-05: 20 mg via INTRAVENOUS
  Filled 2019-05-05: qty 2

## 2019-05-05 MED ORDER — SODIUM CHLORIDE 0.9 % IV SOLN
1000.0000 mg | Freq: Once | INTRAVENOUS | Status: AC
  Administered 2019-05-05: 1000 mg via INTRAVENOUS
  Filled 2019-05-05: qty 40

## 2019-05-05 MED ORDER — SODIUM CHLORIDE 0.9 % IV SOLN
Freq: Once | INTRAVENOUS | Status: AC
  Administered 2019-05-05: 10:00:00 via INTRAVENOUS
  Filled 2019-05-05: qty 250

## 2019-05-05 MED ORDER — DIPHENHYDRAMINE HCL 50 MG/ML IJ SOLN
50.0000 mg | Freq: Once | INTRAMUSCULAR | Status: AC
  Administered 2019-05-05: 50 mg via INTRAVENOUS
  Filled 2019-05-05: qty 1

## 2019-05-05 NOTE — Progress Notes (Signed)
[place] OFFICE PROGRESS NOTE  Patient Care Team: Leone Haven, MD as PCP - General (Family Medicine) [name], MD as Consulting Physician (Cardiology) Cammie Sickle, MD as Medical Oncologist (Hematology and Oncology)  Cancer Staging No matching staging information was found for the patient.   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  MARCH 2017- CT N/C/A/P- NED. STOP Ritux; surveillance   # AUG 2019- CT/PET- progression/NO transformation;  # NOV 2019- Progression; started Ibrutinib 420 mg/d. STOPPED in end of feb sec to extreme fatigue/joint pains/cramps  #November 01, 2018-start ibrutinib 280 mg a day; December 20, 2018-discontinue ibrutinib secondary multiple side effects.   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; IGVH- UN-MUTATED [poor prognosis]  DIAGNOSIS: CLL  STAGE: IV  ;GOALS: control  CURRENT/MOST RECENT THERAPY : Gazyva + venetoclax [pending]    CLL (chronic lymphocytic leukemia) ([place]  01/03/2019 -  Chemotherapy   The patient had obinutuzumab (GAZYVA) 100 mg in sodium chloride 0.9 % 100 mL (0.9615 mg/mL) chemo infusion, 100 mg, Intravenous, Once, 5 of 6 cycles Administration: 100 mg (01/03/2019), 900 mg (01/04/2019), 1,000 mg (01/10/2019), 1,000 mg (01/31/2019), 1,000 mg (01/17/2019), 1,000 mg (02/28/2019), 1,000 mg (04/07/2019), 1,000 mg (05/05/2019)  for chemotherapy treatment.      INTERVAL HISTORY:  Michael HOLNESS89y.[id number].  male CLL-high risk currently on GDyann Kiefis here for follow-up; currently status post 4 cycles is here for follow-up.  Patient currently on venetoclax  modified ramp-up.  He started taking 200 mg a day.  He continues to complain of cramping in the legs.  This did not improve on holding his statin.  Question peripheral vascular disease.  Continues of arthritic pain continues to physical therapy.  Gait instability/improved.   Review of Systems  Constitutional: Positive for malaise/fatigue. Negative for chills, diaphoresis and fever.  HENT: Negative for nosebleeds and sore throat.   Eyes: Negative for double vision.  Respiratory: Negative for cough, hemoptysis, sputum production, shortness of breath and wheezing.   Cardiovascular: Negative for chest pain, palpitations, orthopnea and leg swelling.  Gastrointestinal: Negative for abdominal pain, blood in stool, constipation, diarrhea, heartburn, melena, nausea and vomiting.  Genitourinary: Negative for dysuria, frequency and urgency.  Musculoskeletal: Positive for back pain and joint pain.  Skin: Negative.  Negative for itching and rash.  Neurological: Negative for dizziness, tingling, focal weakness and headaches.  Psychiatric/Behavioral: Negative for depression. The patient is not nervous/anxious and does not have insomnia.       PAST MEDICAL HISTORY :  Past Medical History:  Diagnosis Date  . Anxiety   . Arthritis   . Atrial fibrillation ([place]    a. Dx 2013, recurred 02/2014, CHA2DS2VASc = 3 -->placed on Eliquis;  b. 02/2014 Echo: EF 50-55%, mid and apical anterior septum and mid and apical inf septum are abnl, mild to mod Ao sclerosis w/o AS.  .Marland KitchenChicken pox   . Chronic lymphocytic leukemia ([place]    a. Dx 02/2014.  .Marland KitchenComplication of anesthesia    History of  PTSD--do not touch patient when waking up from surgery.  .Marland KitchenCOPD (chronic obstructive pulmonary disease) ([place]   .  Coronary artery disease    a. 04/2009 CABG x 3 (LIMA->LAD, VG->OM1, VG->PDA);  b. 09/2009 Cath: occluded VG x 2 w/ patent LIMA and L->R collats. EF 55%, mild antlat HK;  c. 10/2011 MV: EF 53%, no isch/infarct-->low risk.   Marland Kitchen Dysrhythmia    hx of a-fib  . GERD (gastroesophageal reflux disease)    occasional  . History of chemotherapy 2015-2016  . HOH (hard of hearing)    Bilateral Hearing Aids  . Hypertension   . Myocardial infarction ([place]) 2010  . OSA on CPAP    USE C-PAP  . Presence of permanent cardiac pacemaker 2017  . PTSD (post-traumatic stress disorder)   . PTSD (post-traumatic stress disorder)   . Pure hypercholesterolemia   . Rheumatic fever 1959  . 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 ABDOMINAL HERNIA REPAIR    . APPENDECTOMY  06/21/1985  . CARDIAC CATHETERIZATION  2010; 2011   ; Dr Fletcher Anon  . CORONARY ARTERY BYPASS GRAFT  04/2009   "CABG X3"  . EP IMPLANTABLE DEVICE N/A 03/03/2016   Procedure: Pacemaker Implant;  Surgeon: Deboraha Sprang, MD;  Location: [facility];  Service: Cardiovascular;  Laterality: N/A;  . FOREIGN BODY REMOVAL  1968   "shrapnel in my tailbone"  . INGUINAL HERNIA REPAIR Right   . INSERT / REPLACE / REMOVE PACEMAKER    . JOINT REPLACEMENT Right 2018  . LAPAROSCOPIC CHOLECYSTECTOMY    . TONSILLECTOMY AND ADENOIDECTOMY  1956  . TOTAL HIP ARTHROPLASTY Right 10/22/2016   Procedure: TOTAL HIP ARTHROPLASTY;  Surgeon: Dereck Leep, MD;  Location: [facility];  Service: Orthopedics;  Laterality: Right;  . TOTAL HIP ARTHROPLASTY Left 11/04/2017   Procedure: TOTAL HIP ARTHROPLASTY;  Surgeon: Dereck Leep, MD;  Location: [facility];  Service: Orthopedics;  Laterality: Left;    FAMILY HISTORY :   Family History  Problem Relation Age of Onset  . Heart disease Mother   . Heart attack Mother   . Coronary artery disease Other        family history    SOCIAL HISTORY:   Social History   Tobacco Use  . Smoking status: Former Smoker    Packs/day: 1.00    Years: 40.00    Pack years: 40.00    Types: Cigarettes    Quit date: 07/21/2006    Years since quitting: 12.8  .  Smokeless tobacco: Never Used  Substance Use Topics  . Alcohol use: Yes    Alcohol/week: 1.0 standard drinks    Types: 1 Standard drinks or equivalent per week    Comment: rarely  . Drug use: No    ALLERGIES:  has No Known Allergies.  MEDICATIONS:  Current Outpatient Medications  Medication Sig Dispense Refill  . acetaminophen (TYLENOL) 500 MG tablet Take 1,000 mg by mouth every 8 (eight) hours as needed for mild pain.    Marland Kitchen acyclovir (ZOVIRAX) 400 MG tablet TAKE 1 TABLET BY MOUTH ONCE DAILY TO  PREVENT  SHINGLES 30 tablet 0  . albuterol (PROVENTIL HFA;VENTOLIN HFA) 108 (90 Base) MCG/ACT inhaler Inhale 2 puffs into the lungs every 6 (six) hours as needed for wheezing or shortness of breath. 1 Inhaler 11  . allopurinol (ZYLOPRIM) 300 MG tablet Take 1 tablet by mouth twice daily 120 tablet 3  . amiodarone (PACERONE) 200 MG tablet TAKE 1/2 TABLET (100 mg)  BY MOUTH  TWICE DAILY 90 tablet 2  . apixaban (ELIQUIS) 5 MG TABS tablet Take 5 mg by mouth 2 (two) times daily.    . budesonide-formoterol (SYMBICORT) 160-4.5 MCG/ACT inhaler Inhale 2 puffs into the lungs 2 (two) times daily. 1 Inhaler 11  . cetirizine (ZYRTEC) 10 MG tablet Take 10 mg by mouth daily as needed for allergies.     . Coenzyme Q10 (COQ10) 200 MG CAPS Take 200 mg by mouth daily.    Marland Kitchen dexamethasone (DECADRON) 4 MG tablet Start 2 days prior to infusion; Take for 2 days. Do not take on the day of infusion. 60 tablet 3  . ezetimibe (ZETIA) 10 MG tablet Take 1 tablet (10 mg total) by mouth daily. 90 tablet 3  . furosemide (LASIX) 20 MG tablet Take 1 tablet (20 mg) by mouth twice daily as needed    . Krill Oil 350 MG CAPS Take 350 mg by mouth every evening.    . metoprolol tartrate (LOPRESSOR) 25 MG tablet Take 25 mg by mouth 2 (two) times daily.    . mirtazapine (REMERON) 15 MG tablet Take 15 mg by mouth at bedtime as needed (for panic associated with PTSD).     Marland Kitchen montelukast (SINGULAIR) 10 MG tablet Take 1 tablet (10 mg total) by  mouth at bedtime. Start 2 days prior to infusion. Take it for 4 days. 60 tablet 0  . Multiple Vitamin (MULTIVITAMIN WITH MINERALS) TABS tablet Take 1 tablet by mouth daily.    . mupirocin ointment (BACTROBAN) 2 % Apply 1 application topically 3 (three) times daily. 30 g 1  . nitroGLYCERIN (NITROSTAT) 0.4 MG SL tablet Place 1 tablet (0.4 mg total) under the tongue every 5 (five) minutes as needed for chest pain. 25 tablet 6  . tiotropium (SPIRIVA) 18 MCG inhalation capsule Place 1 capsule (18 mcg total) into inhaler and inhale at bedtime. 30 capsule 11  . traMADol (ULTRAM) 50 MG tablet TAKE 1 TABLET BY MOUTH EVERY 12 HOURS AS NEEDED FOR MODERATE PAIN 90 tablet 0  . venetoclax (VENCLEXTA) 100 MG TABS Take 200 mg by mouth daily. (Take 2 tablets daily) 60 tablet 6  . atorvastatin (LIPITOR) 80 MG tablet Take 1 tablet (80 mg total) by mouth at bedtime. (Patient not taking: Reported on 05/04/2019) 90 tablet 3   No current facility-administered medications for this visit.     PHYSICAL EXAMINATION: ECOG PERFORMANCE STATUS: 1 - Symptomatic but completely ambulatory  BP (!) 141/81 (BP Location: Left Arm, Patient Position: Sitting)   Pulse 64   Temp 97.7 F (36.5 C) (Tympanic)   Resp 18   Wt 232 lb 3.2 oz (105.3 kg)   BMI 34.29 kg/m   Filed Weights   05/05/19 0843  Weight: 232 lb 3.2 oz (105.3 kg)    Physical Exam  Constitutional: He is oriented to person, place, and time and well-developed, well-nourished, and in no distress.  Patient is alone.  HENT:  Head: Normocephalic and atraumatic.  Mouth/Throat: Oropharynx is clear and moist. No oropharyngeal exudate.  Eyes: Pupils are equal, round, and reactive to light.  Neck: Normal range of motion. Neck supple.  Cardiovascular: Normal rate and regular rhythm.  Pulmonary/Chest: No respiratory distress. He has no wheezes.  Abdominal: Soft. Bowel sounds are normal. He exhibits no distension and no mass. There is no abdominal tenderness. There is  no rebound and no guarding.  Musculoskeletal: Normal range of motion.        General: No tenderness or edema.  Lymphadenopathy:  Resolved lymph nodes in the neck underarms.  Neurological: He is alert and oriented to person, place, and time.  Skin: Skin is warm.  Psychiatric: Affect normal.    LABORATORY DATA:  I have reviewed the data as listed    Component Value Date/Time   NA 141 05/05/2019 0800   NA 139 10/11/2014 1800   K 3.8 05/05/2019 0800   K 3.3 (L) 10/11/2014 1800   CL 110 05/05/2019 0800   CL 106 10/11/2014 1800   CO2 22 05/05/2019 0800   CO2 27 10/11/2014 1800   GLUCOSE 180 (H) 05/05/2019 0800   GLUCOSE 107 (H) 10/11/2014 1800   BUN 17 05/05/2019 0800   BUN 15 10/11/2014 1800   CREATININE 0.92 05/05/2019 0800   CREATININE 0.89 10/11/2014 1800   CALCIUM 9.0 05/05/2019 0800   CALCIUM 8.8 (L) 10/11/2014 1800   PROT 6.5 05/05/2019 0800   PROT 6.7 05/18/2017 1048   PROT 6.4 (L) 10/11/2014 1800   ALBUMIN 4.0 05/05/2019 0800   ALBUMIN 4.3 05/18/2017 1048   ALBUMIN 4.1 10/11/2014 1800   AST 26 05/05/2019 0800   AST 23 10/11/2014 1800   ALT 36 05/05/2019 0800   ALT 22 10/11/2014 1800   ALKPHOS 91 05/05/2019 0800   ALKPHOS 61 10/11/2014 1800   BILITOT 0.9 05/05/2019 0800   BILITOT 0.7 05/18/2017 1048   BILITOT 0.9 10/11/2014 1800   GFRNONAA >60 05/05/2019 0800   GFRNONAA >60 10/11/2014 1800   GFRAA >60 05/05/2019 0800   GFRAA >60 10/11/2014 1800    No results found for: SPEP, UPEP  Lab Results  Component Value Date   WBC 9.3 05/05/2019   NEUTROABS 6.5 05/05/2019   HGB 11.9 (L) 05/05/2019   HCT 36.4 (L) 05/05/2019   MCV 98.4 05/05/2019   PLT 189 05/05/2019      Chemistry      Component Value Date/Time   NA 141 05/05/2019 0800   NA 139 10/11/2014 1800   K 3.8 05/05/2019 0800   K 3.3 (L) 10/11/2014 1800   CL 110 05/05/2019 0800   CL 106 10/11/2014 1800   CO2 22 05/05/2019 0800   CO2 27 10/11/2014 1800   BUN 17 05/05/2019 0800   BUN 15  10/11/2014 1800   CREATININE 0.92 05/05/2019 0800   CREATININE 0.89 10/11/2014 1800      Component Value Date/Time   CALCIUM 9.0 05/05/2019 0800   CALCIUM 8.8 (L) 10/11/2014 1800   ALKPHOS 91 05/05/2019 0800   ALKPHOS 61 10/11/2014 1800   AST 26 05/05/2019 0800   AST 23 10/11/2014 1800   ALT 36 05/05/2019 0800   ALT 22 10/11/2014 1800   BILITOT 0.9 05/05/2019 0800   BILITOT 0.7 05/18/2017 1048   BILITOT 0.9 10/11/2014 1800       RADIOGRAPHIC STUDIES: I have personally reviewed the radiological images as listed and agreed with the findings in the report. No results found.   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 Currently s/p 4 cycles of Gazyva.   # proceed with cycle #5 of Gazyva today; Labs today reviewed;  acceptable for treatment today.continue white count is 11. hemoglobin 118 platelets- 134.; continue venatoclax-  200 mg/day.   #Worsening fatigue/joint pains-question etiology.  Holding Lipitor 80 mg- no difference; go back on statin; appt with cards re: ? PVD.   # A.fib-on amiodarone sinus rhythm-on Eliquis-STABLE.   # TLS/infectious Prophylaxis-allopurinol; Bactrim/acyclovir. STABLE.  DISPOSITION:  # proceed with Gazyva # follow up in 1 month;  MD- labs- cbc/cmp/ldh/mag phos/uric acid;Gazyva-  Dr.B     Orders Placed This Encounter  Procedures  . CBC with Differential    Standing Status:   Future    Standing Expiration Date:   05/04/2020  . Comprehensive metabolic panel    Standing Status:   Future    Standing Expiration Date:   05/04/2020  . Lactate dehydrogenase    Standing Status:   Future    Standing Expiration Date:   05/04/2020  . Magnesium    Standing Status:   Future    Standing Expiration Date:   05/04/2020  . Uric acid    Standing Status:   Future    Standing Expiration Date:   05/04/2020  . Phosphorus     Standing Status:   Future    Standing Expiration Date:   05/04/2020   All questions were answered. The patient knows to call the clinic with any problems, questions or concerns.      Cammie Sickle, MD 05/06/2019 7:04 AM

## 2019-05-05 NOTE — Assessment & Plan Note (Addendum)
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 Currently s/p 4 cycles of Gazyva.   # proceed with cycle #5 of Gazyva today; Labs today reviewed;  acceptable for treatment today.continue white count is 11. hemoglobin 118 platelets- 134.; continue venatoclax-  200 mg/day.   #Worsening fatigue/joint pains-question etiology.  Holding Lipitor 80 mg- no difference; go back on statin; appt with cards re: ? PVD.   # A.fib-on amiodarone sinus rhythm-on Eliquis-STABLE.   # TLS/infectious Prophylaxis-allopurinol; Bactrim/acyclovir. STABLE.   DISPOSITION:  # proceed with Gazyva # follow up in 1 month;  MD- labs- cbc/cmp/ldh/mag phos/uric acid;Gazyva-  Dr.B

## 2019-05-05 NOTE — Progress Notes (Signed)
Pt in for follow up, reports nurse called yesterday with pre visit questions.  Denies any concerns today.

## 2019-05-06 ENCOUNTER — Telehealth: Payer: Self-pay

## 2019-05-06 NOTE — Telephone Encounter (Signed)
Copied from [place] [phone]. Topic: Quick Communication - See Telephone Encounter >> May 05, 2019  6:41 PM Loma [place] wrote: CRM for notification. See Telephone encounter for: 05/05/19. AFTER HRS CALL PLEASE HAVE BLANEY O'BRIEN WITH [facility] CALL THIS PT. IS SCHEDULED FOR AUDIO APPT AT 8;30 ON 10/23 BUT PT HAS A IN OFFICE VISIT WITH SONNENBERG FOR ARRIVAL AT 8:45. ON 10/23. PT SAYS IF YOU WILL [place] AND JUST LEAVE A MESSAGE IN MYCHART  HE CAN UNDERSTAND BETTER.

## 2019-05-09 ENCOUNTER — Ambulatory Visit: Payer: Medicare HMO | Admitting: Physical Therapy

## 2019-05-09 ENCOUNTER — Other Ambulatory Visit: Payer: Self-pay

## 2019-05-09 ENCOUNTER — Encounter: Payer: Self-pay | Admitting: Physical Therapy

## 2019-05-09 DIAGNOSIS — M6281 Muscle weakness (generalized): Secondary | ICD-10-CM

## 2019-05-09 DIAGNOSIS — R262 Difficulty in walking, not elsewhere classified: Secondary | ICD-10-CM

## 2019-05-09 NOTE — Therapy (Signed)
[place] MAIN [facility] SERVICES [address] [place], [place], [address] Phone: [phone]   Fax:  [phone]  Physical Therapy Treatment  Patient Details  Name: [name] MRN: [id number] Date of Birth: [birth year] Referring Provider (PT): Cindra Presume   Encounter Date: 05/09/2019  PT End of Session - 05/09/19 0932    Visit Number  13    Number of Visits  17    Date for PT Re-Evaluation  05/03/19    PT Start Time  0929    PT Stop Time  1010    PT Time Calculation (min)  41 min    Equipment Utilized During Treatment  Gait belt    Activity Tolerance  Patient tolerated treatment well;No increased pain    Behavior During Therapy  WFL for tasks assessed/performed       Past Medical History:  Diagnosis Date  . Anxiety   . Arthritis   . Atrial fibrillation ([place])    a. Dx 2013, recurred 02/2014, CHA2DS2VASc = 3 -->placed on Eliquis;  b. 02/2014 Echo: EF 50-55%, mid and apical anterior septum and mid and apical inf septum are abnl, mild to mod Ao sclerosis w/o AS.  Marland Kitchen Chicken pox   . Chronic lymphocytic leukemia ([place])    a. Dx 02/2014.  Marland Kitchen Complication of anesthesia    History of  PTSD--do not touch patient when waking up from surgery.  Marland Kitchen COPD (chronic obstructive pulmonary disease) ([place])   . Coronary artery disease    a. 04/2009 CABG x 3 (LIMA->LAD, VG->OM1, VG->PDA);  b. 09/2009 Cath: occluded VG x 2 w/ patent LIMA and L->R collats. EF 55%, mild antlat HK;  c. 10/2011 MV: EF 53%, no isch/infarct-->low risk.  Marland Kitchen Dysrhythmia    hx of a-fib  . GERD (gastroesophageal reflux disease)    occasional  . History of chemotherapy 2015-2016  . HOH (hard of hearing)    Bilateral Hearing Aids  . Hypertension   . Myocardial infarction ([place]) 2010  . OSA on CPAP    USE C-PAP  . Presence of permanent cardiac pacemaker 2017  . PTSD (post-traumatic stress disorder)   . PTSD (post-traumatic stress disorder)   . Pure hypercholesterolemia   .  Rheumatic fever 1959  . 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 ABDOMINAL HERNIA REPAIR    . APPENDECTOMY  06/21/1985  . CARDIAC CATHETERIZATION  2010; 2011   ; Dr Fletcher Anon  . CORONARY ARTERY BYPASS GRAFT  04/2009   "CABG X3"  . EP IMPLANTABLE DEVICE N/A 03/03/2016   Procedure: Pacemaker Implant;  Surgeon: Deboraha Sprang, MD;  Location: [facility];  Service: Cardiovascular;  Laterality: N/A;  . FOREIGN BODY REMOVAL  1968   "shrapnel in my tailbone"  . INGUINAL HERNIA REPAIR Right   . INSERT / REPLACE / REMOVE PACEMAKER    . JOINT REPLACEMENT Right 2018  . LAPAROSCOPIC CHOLECYSTECTOMY    . TONSILLECTOMY AND ADENOIDECTOMY  1956  . TOTAL HIP ARTHROPLASTY Right 10/22/2016   Procedure: TOTAL HIP ARTHROPLASTY;  Surgeon: Dereck Leep, MD;  Location: [facility];  Service: Orthopedics;  Laterality: Right;  . TOTAL HIP ARTHROPLASTY Left 11/04/2017   Procedure: TOTAL HIP ARTHROPLASTY;  Surgeon: Dereck Leep, MD;  Location: [facility] OR[facility];  Service: Orthopedics;  Laterality: Left;    There were no vitals filed for this visit.  Subjective Assessment - 05/09/19 0931  Subjective  Patient reports that he is doing better with his balance and he is not stumbling as much, no pain or new complaints    Pertinent History  Patient is having pain in BLE legs , but it never goes away. He is able to do his HEP.    Currently in Pain?  Yes    Pain Score  5     Pain Location  Leg    Pain Orientation  Right;Left    Pain Descriptors / Indicators  Aching;Constant    Pain Type  Chronic pain    Pain Radiating Towards  na    Pain Onset  More than a month ago    Pain Frequency  Constant    Aggravating Factors   na    Pain Relieving Factors  na    Effect of Pain on Daily Activities  na       Ther-ex  Leg press 100 lbs x 20 x 3    Neuromuscular Re-education  Rocker board fwd/bwd, side t0 side x 2 mins Fwd stepping on 2 x 4 with out  UE assist x 3 laps, CGA for loss of balance Side stepping on 2 x 4 without UE assist x 3 laps, CGA for loss of balance Side stepping on air beam without uE Assist x 3 laps ,CGA for loss of balance Tandem stepping on air foam with balloon taps to mirror x 2 mins  Modified tandem standing on foam with self ball toss x 20  Toe taps to 6" step without UE support alternating LE x15 each  Normal stance on Airex pad 2x60sec (requires minA for falls recovery 2 times)? Forward and backward stepping over hurdle x15 each direction  SIde stepping over hurdle 15x each direction  Matrix 22. 5 lbs x 1 lap fwd/bwd/side to side   Pt educated throughout session about proper posture and technique with exercises. Improved exercise technique, movement at target joints, use of target muscles after min to mod verbal, visual, tactile cues                         PT Education - 05/09/19 0931    Education Details  HEP    Person(s) Educated  Patient    Methods  Explanation    Comprehension  Verbalized understanding;Returned demonstration;Verbal cues required;Need further instruction       PT Short Term Goals - 04/18/19 1118      PT SHORT TERM GOAL #1   Title  Patient will be independent in home exercise program to improve strength/mobility for better functional independence with ADLs.    Baseline  Patient partially independent, needs further updating for his HEP.    Time  4    Period  Weeks    Status  On-going    Target Date  05/03/19      PT SHORT TERM GOAL #2   Title  Patient (73 years old) will complete five times sit to stand test in < 10 seconds indicating an increased LE strength and improved balance.    Baseline  04/18/2019: 22secs    Time  4    Period  Weeks    Status  New    Target Date  05/03/19        PT Long Term Goals - 04/25/19 0941      PT LONG TERM GOAL #1   Title  Patient (73 years old) will complete five times sit to stand  test in < 15 seconds  indicating an increased LE strength and improved balance.    Baseline  04/25/19=44 .07sec    Time  8    Period  Weeks    Status  Partially Met    Target Date  06/28/19      PT LONG TERM GOAL #2   Title  Patient will increase Berg Balance score by > 6 points to demonstrate decreased fall risk during functional activities.    Baseline  04/25/19=49/56    Time  8    Period  Weeks    Status  Partially Met    Target Date  06/28/19      PT LONG TERM GOAL #3   Title  Patient will increase 10 meter walk test to >1.66ms as to improve gait speed for better community ambulation and to reduce fall risk.    Baseline  04/25/19=.71 m/sec    Time  8    Period  Weeks    Status  Partially Met    Target Date  06/28/19            Plan - 05/09/19 0933    Clinical Impression Statement  Pt requires direction and verbal cues for correct performance of balance and strengthening exercises. Patient demonstrates weakness in BLE and performs open and closed chain exercises with no reports of increased pain. Pt was able to perform all exercises with min assist and VC for technique Pt encouraged continuing HEP .Follow-up as scheduled.    Personal Factors and Comorbidities  Age;Comorbidity 1    Comorbidities  COPD,    Examination-Participation Restrictions  Yard Work;Cleaning;Community Activity    Stability/Clinical Decision Making  Evolving/Moderate complexity    Rehab Potential  Fair    PT Frequency  2x / week    PT Duration  8 weeks    PT Treatment/Interventions  Gait training;Therapeutic activities;Balance training;Therapeutic exercise;Patient/family education    PT Next Visit Plan  strengthening and balance    PT Home Exercise Plan  heel raises, hip abd, hip ext YTB    Consulted and Agree with Plan of Care  Patient       Patient will benefit from skilled therapeutic intervention in order to improve the following deficits and impairments:  Abnormal gait, Decreased balance, Decreased mobility,  Difficulty walking, Cardiopulmonary status limiting activity, Decreased activity tolerance, Decreased strength, Pain  Visit Diagnosis: Muscle weakness (generalized)  Difficulty in walking, not elsewhere classified     Problem List Patient Active Problem List   Diagnosis Date Noted  . Dehydration 03/14/2019  . Gross hematuria 05/12/2018  . Sick sinus syndrome ([place] 07/08/2016  . Goals of care, counseling/discussion 07/08/2016  . 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 Chronic fatigue   . 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 PAD (peripheral artery disease) ([place]   . OSA on CPAP   . TIA (transient ischemic attack) 11/02/2015  . Chronic diastolic CHF (congestive heart failure) ([place] 09/20/2015  . CLL (chronic lymphocytic leukemia) ([place]   . PTSD (post-traumatic stress disorder)   . Osteoarthritis of both knees 07/05/2015  . COPD (chronic obstructive pulmonary disease) ([place] 01/01/2012  . Shortness of breath 10/09/2011  . Paroxysmal atrial fibrillation ([place] 10/09/2011  . Hyperlipidemia 11/28/2009  . Essential hypertension 11/28/2009    MAlanson Puls PT DPT 05/09/2019, 9:41 AM  [facility]ERV[facility] [address] [place] [address]Phone: [phone]  Fax:  [phone]  Name: [name] MRN: [id number] Date of Birth: 1945-10-29

## 2019-05-09 NOTE — Telephone Encounter (Signed)
Called and spoke to pt

## 2019-05-11 DIAGNOSIS — G4733 Obstructive sleep apnea (adult) (pediatric): Secondary | ICD-10-CM | POA: Diagnosis not present

## 2019-05-12 ENCOUNTER — Ambulatory Visit (INDEPENDENT_AMBULATORY_CARE_PROVIDER_SITE_OTHER): Payer: Medicare HMO

## 2019-05-12 ENCOUNTER — Other Ambulatory Visit: Payer: Self-pay

## 2019-05-12 ENCOUNTER — Ambulatory Visit: Payer: Medicare HMO | Admitting: Physical Therapy

## 2019-05-12 ENCOUNTER — Encounter: Payer: Self-pay | Admitting: Physical Therapy

## 2019-05-12 DIAGNOSIS — R262 Difficulty in walking, not elsewhere classified: Secondary | ICD-10-CM

## 2019-05-12 DIAGNOSIS — M6281 Muscle weakness (generalized): Secondary | ICD-10-CM

## 2019-05-12 DIAGNOSIS — Z0000 Encounter for general adult medical examination without abnormal findings: Secondary | ICD-10-CM

## 2019-05-12 NOTE — Patient Instructions (Addendum)
  [name] , Thank you for taking time to come for your Medicare Wellness Visit. I appreciate your ongoing commitment to your health goals. Please review the following plan we discussed and let me know if I can assist you in the future.   These are the goals we discussed: Goals    . 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 Colon Cancer Screening  07/02/2022  . Tetanus Vaccine  01/15/2028  . Flu Shot  Completed  .  Hepatitis C: One time screening is recommended by Center for Disease Control  (CDC) for  adults born from [birth year] through 1966.   Completed  . Pneumonia vaccines  Completed

## 2019-05-12 NOTE — Therapy (Addendum)
[place] MAIN [facility] SERVICES [address] [place], [place], [address] Phone: [phone]   Fax:  [phone]  Physical Therapy Treatment  Patient Details  Name: [name] MRN: [id number] Date of Birth: 1945/07/26 Referring Provider (PT): Cindra Presume   Encounter Date: 05/12/2019  PT End of Session - 05/12/19 0939    Visit Number  14    Number of Visits  17    Date for PT Re-Evaluation  05/03/19    PT Start Time  0931    PT Stop Time  1011    PT Time Calculation (min)  40 min    Equipment Utilized During Treatment  Gait belt    Activity Tolerance  Patient tolerated treatment well;No increased pain    Behavior During Therapy  WFL for tasks assessed/performed       Past Medical History:  Diagnosis Date  . Anxiety   . Arthritis   . Atrial fibrillation ([place])    a. Dx 2013, recurred 02/2014, CHA2DS2VASc = 3 -->placed on Eliquis;  b. 02/2014 Echo: EF 50-55%, mid and apical anterior septum and mid and apical inf septum are abnl, mild to mod Ao sclerosis w/o AS.  Marland Kitchen Chicken pox   . Chronic lymphocytic leukemia ([place])    a. Dx 02/2014.  Marland Kitchen Complication of anesthesia    History of  PTSD--do not touch patient when waking up from surgery.  Marland Kitchen COPD (chronic obstructive pulmonary disease) ([place])   . Coronary artery disease    a. 04/2009 CABG x 3 (LIMA->LAD, VG->OM1, VG->PDA);  b. 09/2009 Cath: occluded VG x 2 w/ patent LIMA and L->R collats. EF 55%, mild antlat HK;  c. 10/2011 MV: EF 53%, no isch/infarct-->low risk.  Marland Kitchen Dysrhythmia    hx of a-fib  . GERD (gastroesophageal reflux disease)    occasional  . History of chemotherapy 2015-2016  . HOH (hard of hearing)    Bilateral Hearing Aids  . Hypertension   . Myocardial infarction ([place]) 2010  . OSA on CPAP    USE C-PAP  . Presence of permanent cardiac pacemaker 2017  . PTSD (post-traumatic stress disorder)   . PTSD (post-traumatic stress disorder)   . Pure hypercholesterolemia   .  Rheumatic fever 1959  . 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 ABDOMINAL HERNIA REPAIR    . APPENDECTOMY  06/21/1985  . CARDIAC CATHETERIZATION  2010; 2011   ; Dr Fletcher Anon  . CORONARY ARTERY BYPASS GRAFT  04/2009   "CABG X3"  . EP IMPLANTABLE DEVICE N/A 03/03/2016   Procedure: Pacemaker Implant;  Surgeon: Deboraha Sprang, MD;  Location: [facility];  Service: Cardiovascular;  Laterality: N/A;  . FOREIGN BODY REMOVAL  1968   "shrapnel in my tailbone"  . INGUINAL HERNIA REPAIR Right   . INSERT / REPLACE / REMOVE PACEMAKER    . JOINT REPLACEMENT Right 2018  . LAPAROSCOPIC CHOLECYSTECTOMY    . TONSILLECTOMY AND ADENOIDECTOMY  1956  . TOTAL HIP ARTHROPLASTY Right 10/22/2016   Procedure: TOTAL HIP ARTHROPLASTY;  Surgeon: Dereck Leep, MD;  Location: [facility];  Service: Orthopedics;  Laterality: Right;  . TOTAL HIP ARTHROPLASTY Left 11/04/2017   Procedure: TOTAL HIP ARTHROPLASTY;  Surgeon: Dereck Leep, MD;  Location: [facility] OR[facility];  Service: Orthopedics;  Laterality: Left;    There were no vitals filed for this visit.  Subjective Assessment - 05/12/19 0623  Subjective  Patients wife has surgery 2 days ago and he didnt sleep well. Pt reports he is doing well today. No pain reported today but reports some stiffness  in BLE. Denies increase in soreness following last therapy session. No specific questions or concerns currently   Pertinent History  Patient is having pain in BLE legs , but it never goes away. He is able to do his HEP.    Currently in Pain?  No/denies    Pain Score  0-No pain    Pain Onset  More than a month ago         Neuromuscular Re-education  Octane fitness x 5 mins L 4 Rocker board fwd/bwd, side to side x 20 each direction Tandem gait on 2"x4" without UE support x 2 lengths Side stepping on 2"x4" without UE support x 2 lengths Heel/toe raises without UE support 3s hold x 10 each 1/2 foam roll  balance with flat side up 30s x 2 reps 1/2 foam roll balance with flat side down 30s x 2 reps 1/2 foam roll tandem balance alternating forward LE 30s x 2 each LE forward Lateral side steps from foam to 6 inch stool left and right x 15 Backwards stepping from foam to 6 inch stool x 15        Pt educated throughout session about proper posture and technique with exercises. Improved exercise technique, movement at target joints, use of target muscles after min to mod verbal, visual, tactile cues. CGA and Min to mod verbal cues used throughout with increased in postural sway and LOB most seen with narrow base of support and while on uneven surfaces. Continues to have balance deficits typical with diagnosis. Patient performs intermediate level exercises without pain behaviors and needs verbal cuing for postural alignment and head positioning Tactile cues and assistance needed to keep lower leg and knee in neutral to avoid compensations with ankle motions.                      PT Education - 05/12/19 0939    Education Details  HEP    Person(s) Educated  Patient    Methods  Explanation;Demonstration    Comprehension  Returned demonstration;Verbalized understanding;Need further instruction       PT Short Term Goals - 04/18/19 1118      PT SHORT TERM GOAL #1   Title  Patient will be independent in home exercise program to improve strength/mobility for better functional independence with ADLs.    Baseline  Patient partially independent, needs further updating for his HEP.    Time  4    Period  Weeks    Status  On-going    Target Date  05/03/19      PT SHORT TERM GOAL #2   Title  Patient (< 17 years old) will complete five times sit to stand test in < 10 seconds indicating an increased LE strength and improved balance.    Baseline  04/18/2019: 22secs    Time  4    Period  Weeks    Status  New    Target Date  05/03/19        PT Long Term Goals - 04/25/19 0941      PT  LONG TERM GOAL #1   Title  Patient (> 5 years old) will complete five times sit to stand test in < 15 seconds indicating an increased LE strength and improved balance.    Baseline  04/25/19=44 .30QTM  Time  8    Period  Weeks    Status  Partially Met    Target Date  06/28/19      PT LONG TERM GOAL #2   Title  Patient will increase Berg Balance score by > 6 points to demonstrate decreased fall risk during functional activities.    Baseline  04/25/19=49/56    Time  8    Period  Weeks    Status  Partially Met    Target Date  06/28/19      PT LONG TERM GOAL #3   Title  Patient will increase 10 meter walk test to >1.67ms as to improve gait speed for better community ambulation and to reduce fall risk.    Baseline  04/25/19=.71 m/sec    Time  8    Period  Weeks    Status  Partially Met    Target Date  06/28/19            Plan - 05/12/19 0940    Clinical Impression Statement  Patient instructed in intermediate mobility challenges, transfer training and safety training. Patient required min-mod VCs for correct positioning; Patient had increased difficulty with dynamic balance challenges and dule task movements especially in standing.  Patient would benefit from additional skilled PT intervention to improve strength, balance.   Personal Factors and Comorbidities  Age;Comorbidity 1    Comorbidities  COPD,    Examination-Participation Restrictions  Yard Work;Cleaning;Community Activity    Stability/Clinical Decision Making  Evolving/Moderate complexity    Rehab Potential  Fair    PT Frequency  2x / week    PT Duration  8 weeks    PT Treatment/Interventions  Gait training;Therapeutic activities;Balance training;Therapeutic exercise;Patient/family education    PT Next Visit Plan  strengthening and balance    PT Home Exercise Plan  heel raises, hip abd, hip ext YTB    Consulted and Agree with Plan of Care  Patient       Patient will benefit from skilled therapeutic intervention in  order to improve the following deficits and impairments:  Abnormal gait, Decreased balance, Decreased mobility, Difficulty walking, Cardiopulmonary status limiting activity, Decreased activity tolerance, Decreased strength, Pain  Visit Diagnosis: Difficulty in walking, not elsewhere classified  Muscle weakness (generalized)     Problem List Patient Active Problem List   Diagnosis Date Noted  . Dehydration 03/14/2019  . Gross hematuria 05/12/2018  . Sick sinus syndrome ([place] 07/08/2016  . Goals of care, counseling/discussion 07/08/2016  . 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 Chronic fatigue   . 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 PAD (peripheral artery disease) ([place]   . OSA on CPAP   . TIA (transient ischemic attack) 11/02/2015  . Chronic diastolic CHF (congestive heart failure) ([place] 09/20/2015  . CLL (chronic lymphocytic leukemia) ([place]   . PTSD (post-traumatic stress disorder)   . Osteoarthritis of both knees 07/05/2015  . COPD (chronic obstructive pulmonary disease) ([place] 01/01/2012  . Shortness of breath 10/09/2011  . Paroxysmal atrial fibrillation ([place] 10/09/2011  . Hyperlipidemia 11/28/2009  . Essential hypertension 11/28/2009    MAlanson Puls PT DPT 05/12/2019, 1:49 PM  [facility]MAIN [facility]SERV[facility] [address] [address]Phone: [phone]  Fax:  [phone] Name: Michael FARISHMRN: [id number] of Birth: 1Feb 06, 1947

## 2019-05-12 NOTE — Addendum Note (Signed)
Addended by: Alanson Puls on: 05/12/2019 01:58 PM   Modules accepted: Orders

## 2019-05-12 NOTE — Progress Notes (Signed)
Subjective:   [name] is a 73 y.o. male who presents for Medicare Annual/Subsequent preventive examination.  Review of Systems:  No ROS.  Medicare Wellness Virtual Visit.  Visual/audio telehealth visit, UTA vital signs.   See social history for additional risk factors.   Cardiac Risk Factors include: advanced age (>58men, >11 women);male gender;hypertension     Objective:    Vitals: There were no vitals taken for this visit.  There is no height or weight on file to calculate BMI.  Advanced Directives 05/12/2019 05/04/2019 04/15/2019 04/04/2019 03/25/2019 03/18/2019 03/18/2019  Does Patient Have a Medical Advance Directive? Yes Yes Yes Yes Yes Yes No  Type of Advance Directive Living will;Healthcare Power of [place];Living will Living will;Healthcare Power of [place];Living will [place];Living will -  Does patient want to make changes to medical advance directive? No - Patient declined - No - Patient declined No - Patient declined No - Patient declined No - Patient declined No - Patient declined  Copy of [place] in Chart? No - copy requested Yes - validated most recent copy scanned in chart (See row information) No - copy requested No - copy requested - No - copy requested -  Would patient like information on creating a medical advance directive? - - No - Patient declined No - Patient declined - No - Patient declined No - Patient declined    Tobacco Social History   Tobacco Use  Smoking Status Former Smoker  . Packs/day: 1.00  . Years: 40.00  . Pack years: 40.00  . Types: Cigarettes  . Quit date: 07/21/2006  . Years since quitting: 12.8  Smokeless Tobacco Never Used     Counseling given: Not Answered   Clinical Intake:  Pre-visit preparation completed: Yes           How often do you need to have someone help you when you read  instructions, pamphlets, or other written materials from your doctor or pharmacy?: 1 - Never  Interpreter Needed?: No     Past Medical History:  Diagnosis Date  . Anxiety   . Arthritis   . Atrial fibrillation ([place])    a. Dx 2013, recurred 02/2014, CHA2DS2VASc = 3 -->placed on Eliquis;  b. 02/2014 Echo: EF 50-55%, mid and apical anterior septum and mid and apical inf septum are abnl, mild to mod Ao sclerosis w/o AS.  Marland Kitchen Chicken pox   . Chronic lymphocytic leukemia ([place])    a. Dx 02/2014.  Marland Kitchen Complication of anesthesia    History of  PTSD--do not touch patient when waking up from surgery.  Marland Kitchen COPD (chronic obstructive pulmonary disease) ([place])   . Coronary artery disease    a. 04/2009 CABG x 3 (LIMA->LAD, VG->OM1, VG->PDA);  b. 09/2009 Cath: occluded VG x 2 w/ patent LIMA and L->R collats. EF 55%, mild antlat HK;  c. 10/2011 MV: EF 53%, no isch/infarct-->low risk.  Marland Kitchen Dysrhythmia    hx of a-fib  . GERD (gastroesophageal reflux disease)    occasional  . History of chemotherapy 2015-2016  . HOH (hard of hearing)    Bilateral Hearing Aids  . Hypertension   . Myocardial infarction ([place]) 2010  . OSA on CPAP    USE C-PAP  . Presence of permanent cardiac pacemaker 2017  . PTSD (post-traumatic stress disorder)   . PTSD (post-traumatic stress disorder)   . Pure hypercholesterolemia   . Rheumatic  fever 1959  . 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 ABDOMINAL HERNIA REPAIR    . APPENDECTOMY  06/21/1985  . CARDIAC CATHETERIZATION  2010; 2011   ; Dr Fletcher Anon  . CORONARY ARTERY BYPASS GRAFT  04/2009   "CABG X3"  . EP IMPLANTABLE DEVICE N/A 03/03/2016   Procedure: Pacemaker Implant;  Surgeon: Deboraha Sprang, MD;  Location: [facility];  Service: Cardiovascular;  Laterality: N/A;  . FOREIGN BODY REMOVAL  1968   "shrapnel in my tailbone"  . INGUINAL HERNIA REPAIR Right   . INSERT / REPLACE / REMOVE PACEMAKER    . JOINT  REPLACEMENT Right 2018  . LAPAROSCOPIC CHOLECYSTECTOMY    . TONSILLECTOMY AND ADENOIDECTOMY  1956  . TOTAL HIP ARTHROPLASTY Right 10/22/2016   Procedure: TOTAL HIP ARTHROPLASTY;  Surgeon: Dereck Leep, MD;  Location: [facility];  Service: Orthopedics;  Laterality: Right;  . TOTAL HIP ARTHROPLASTY Left 11/04/2017   Procedure: TOTAL HIP ARTHROPLASTY;  Surgeon: Dereck Leep, MD;  Location: [facility];  Service: Orthopedics;  Laterality: Left;   Family History  Problem Relation Age of Onset  . Heart disease Mother   . Heart attack Mother   . Coronary artery disease Other        family history   Social History   Socioeconomic History  . Marital status: Married    Spouse name: Not on file  . Number of children: Not on file  . Years of education: Not on file  . Highest education level: Not on file  Occupational History  . Occupation: retired    Fish farm manager: Transport planner    Comment: [employer]  . Financial resource strain: Not hard at all  . Food insecurity    Worry: Never true    Inability: Never true  . Transportation needs    Medical: No    Non-medical: No  Tobacco Use  . Smoking status: Former Smoker    Packs/day: 1.00    Years: 40.00    Pack years: 40.00    Types: Cigarettes    Quit date: 07/21/2006    Years since quitting: 12.8  . Smokeless tobacco: Never Used  Substance and Sexual Activity  . Alcohol use: Not Currently  . Drug use: No  . Sexual activity: Not Currently  Lifestyle  . Physical activity    Days per week: 7 days    Minutes per session: 30 min  . Stress: Not at all  Relationships  . Social Herbalist on phone: Not on file    Gets together: Not on file    Attends religious service: Not on file    Active member of club or organization: Not on file    Attends meetings of clubs or organizations: Not on file    Relationship status: Not on file  Other Topics Concern  . Not on file  Social History Narrative    Lives in [place] with wife. Dogs. Goats. Children - 4. Grandchildren - 7.      Worked - Sports coach, part time.   Diet - healthy   Exercise - very active      Service - [employer] - [place]    Outpatient Encounter Medications as of 05/12/2019  Medication Sig  . acetaminophen (TYLENOL) 500 MG tablet Take 1,000 mg by mouth every 8 (eight) hours as needed for mild pain.  Marland Kitchen acyclovir (ZOVIRAX)  400 MG tablet TAKE 1 TABLET BY MOUTH ONCE DAILY TO  PREVENT  SHINGLES  . albuterol (PROVENTIL HFA;VENTOLIN HFA) 108 (90 Base) MCG/ACT inhaler Inhale 2 puffs into the lungs every 6 (six) hours as needed for wheezing or shortness of breath.  . allopurinol (ZYLOPRIM) 300 MG tablet Take 1 tablet by mouth twice daily  . amiodarone (PACERONE) 200 MG tablet TAKE 1/2 TABLET (100 mg)  BY MOUTH TWICE DAILY  . apixaban (ELIQUIS) 5 MG TABS tablet Take 5 mg by mouth 2 (two) times daily.  Marland Kitchen atorvastatin (LIPITOR) 80 MG tablet Take 1 tablet (80 mg total) by mouth at bedtime. (Patient not taking: Reported on 05/04/2019)  . budesonide-formoterol (SYMBICORT) 160-4.5 MCG/ACT inhaler Inhale 2 puffs into the lungs 2 (two) times daily.  . cetirizine (ZYRTEC) 10 MG tablet Take 10 mg by mouth daily as needed for allergies.   . Coenzyme Q10 (COQ10) 200 MG CAPS Take 200 mg by mouth daily.  Marland Kitchen dexamethasone (DECADRON) 4 MG tablet Start 2 days prior to infusion; Take for 2 days. Do not take on the day of infusion.  Marland Kitchen ezetimibe (ZETIA) 10 MG tablet Take 1 tablet (10 mg total) by mouth daily.  . furosemide (LASIX) 20 MG tablet Take 1 tablet (20 mg) by mouth twice daily as needed  . Krill Oil 350 MG CAPS Take 350 mg by mouth every evening.  . metoprolol tartrate (LOPRESSOR) 25 MG tablet Take 25 mg by mouth 2 (two) times daily.  . mirtazapine (REMERON) 15 MG tablet Take 15 mg by mouth at bedtime as needed (for panic associated with PTSD).   Marland Kitchen montelukast (SINGULAIR) 10 MG tablet Take 1 tablet (10 mg total) by mouth at bedtime. Start 2  days prior to infusion. Take it for 4 days.  . Multiple Vitamin (MULTIVITAMIN WITH MINERALS) TABS tablet Take 1 tablet by mouth daily.  . mupirocin ointment (BACTROBAN) 2 % Apply 1 application topically 3 (three) times daily.  . nitroGLYCERIN (NITROSTAT) 0.4 MG SL tablet Place 1 tablet (0.4 mg total) under the tongue every 5 (five) minutes as needed for chest pain.  Marland Kitchen tiotropium (SPIRIVA) 18 MCG inhalation capsule Place 1 capsule (18 mcg total) into inhaler and inhale at bedtime.  . traMADol (ULTRAM) 50 MG tablet TAKE 1 TABLET BY MOUTH EVERY 12 HOURS AS NEEDED FOR MODERATE PAIN  . venetoclax (VENCLEXTA) 100 MG TABS Take 200 mg by mouth daily. (Take 2 tablets daily)   No facility-administered encounter medications on file as of 05/12/2019.     Activities of Daily Living In your present state of health, do you have any difficulty performing the following activities: 05/12/2019  Hearing? Y  Comment Hearing aids  Vision? N  Difficulty concentrating or making decisions? N  Walking or climbing stairs? Y  Comment Unsteady gait  Dressing or bathing? N  Doing errands, shopping? N  Preparing Food and eating ? Y  Comment Wife preps meals. Self feeds.  Using the Toilet? N  In the past six months, have you accidently leaked urine? N  Do you have problems with loss of bowel control? N  Managing your Medications? N  Managing your Finances? N  Housekeeping or managing your Housekeeping? Y  Comment Wife manages  Some recent data might be hidden    Patient Care Team: Leone Haven, MD as PCP - General (Family Medicine) [name], MD as Consulting Physician (Cardiology) Cammie Sickle, MD as Medical Oncologist (Hematology and Oncology)   Assessment:   This  is a routine wellness examination for [facility].  Nurse connected with patient 05/12/19 at 10:30 AM EDT by a phone  enabled telemedicine application and verified that I am speaking with the correct person using two  identifiers. Patient stated full name and DOB. Patient gave permission to continue with virtual visit. Patient's location was at home and Nurse's location was at [place] office.   Health Maintenance Due: See completed HM at the end of note.   Eye: Visual acuity not assessed. Virtual visit. Wears corrective lenses. Followed by their ophthalmologist  Retinopathy- none reported  Dental: Dentures- yes  Hearing: Hearing aids- yes  Safety:  Patient feels safe at home- yes Patient does have smoke detectors at home- yes Patient does wear sunscreen or protective clothing when in direct sunlight - yes Patient does wear seat belt when in a moving vehicle - yes Patient drives- yes Adequate lighting in walkways free from debris- yes Grab bars and handrails used as appropriate- yes Ambulates with a cane as an assistive device Cell phone on person when ambulating outside of the home- yes  Social: Alcohol intake - no     Smoking history- former    Smokers in home? none Illicit drug use? none  Depression: PHQ 2 &9 complete. See screening below. Denies irritability, anhedonia, sadness/tearfullness.  Stable. Follow[facility] by [facility] as needed.   Falls: See screening below.  None since last recorded.   Medication: Taking as directed and without issues.   Covid-19: Precautions and sickness symptoms discussed. Wears mask, social distancing, hand hygiene as appropriate.   Activities of Daily Living Patient denies needing assistance with: household chores, feeding themselves, getting from bed to chair, getting to the toilet, bathing/showering, dressing, managing money, or preparing meals.   Assisted by household chores with meal prep as needed.   Memory: Patient is alert. Patient denies difficulty focusing or concentrating. Correctly identified the president of the [place], season and recall.  Patient likes to play computer games for brain stimulation.  BMI- discussed the importance of a healthy  diet, water intake and the benefits of aerobic exercise.  Educational material provided.  Physical activity- no routine.  Diet: regular Water: good intake  Other Providers Patient Care Team: Leone Haven, MD as PCP - General (Family Medicine) Rockey Situ, Kathlene November, MD as Consulting Physician (Cardiology) Cammie Sickle, MD as Medical Oncologist (Hematology and Oncology)  Exercise Activities and Dietary recommendations Current Exercise Habits: Structured exercise class, Time (Minutes): 45, Frequency (Times/Week): 2, Weekly Exercise (Minutes/Week): 90, Intensity: Mild, Exercise limited by: Other - see comments(Physical therapy)  Goals    . 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 0 1 Yes No No  Number falls in past yr: - 1 2 or more 1 -  Comment - - - He tripped.  -  Injury with Fall? - 0 No No -  Follow up - Falls evaluation completed - Falls prevention discussed;Education provided -   Timed Get Up and Go Performed: no, virtual visit  Depression Screen PHQ 2/9 Scores 05/12/2019 12/31/2018 05/12/2018 03/19/2018  PHQ - 2 Score 0 0 0 0  PHQ- 9 Score - 0 - -    Cognitive Function     6CIT Screen 05/12/2019 03/19/2018  What Year? 0 points 0 points  What month? 0 points 0 points  What  time? 0 points 0 points  Count back from 20 0 points 0 points  Months in reverse 0 points 0 points  Repeat phrase 0 points 0 points  Total Score 0 0    Immunization History  Administered Date(s) Administered  . Influenza Split 05/21/2012  . Influenza, High Dose Seasonal PF 04/21/2018  . Influenza,inj,Quad PF,6+ Mos 03/16/2014, 06/20/2015, 04/27/2016  . Pneumococcal Conjugate-13 07/05/2015  . Pneumococcal Polysaccharide-23 01/07/2011  . Tdap 01/14/2018   Screening Tests Health Maintenance  Topic Date Due  . COLONOSCOPY  07/02/2022   . TETANUS/TDAP  01/15/2028  . INFLUENZA VACCINE  Completed  . Hepatitis C Screening  Completed  . PNA vac Low Risk Adult  Completed      Plan:   Keep all routine maintenance appointments.   Follow up with your doctor 05/13/19.  Medicare Attestation I have personally reviewed: The patient's medical and social history Their use of alcohol, tobacco or illicit drugs Their current medications and supplements The patient's functional ability including ADLs,fall risks, home safety risks, cognitive, and hearing and visual impairment Diet and physical activities Evidence for depression   In addition, I have reviewed and discussed with patient certain preventive protocols, quality metrics, and best practice recommendations. A written personalized care plan for preventive services as well as general preventive health recommendations were provided to patient via mail.     Varney Biles, LPN  [id number]

## 2019-05-13 ENCOUNTER — Ambulatory Visit (INDEPENDENT_AMBULATORY_CARE_PROVIDER_SITE_OTHER): Payer: Medicare HMO | Admitting: Family Medicine

## 2019-05-13 ENCOUNTER — Other Ambulatory Visit: Payer: Self-pay

## 2019-05-13 ENCOUNTER — Ambulatory Visit: Payer: Medicare HMO

## 2019-05-13 ENCOUNTER — Encounter: Payer: Self-pay | Admitting: Family Medicine

## 2019-05-13 DIAGNOSIS — Z9989 Dependence on other enabling machines and devices: Secondary | ICD-10-CM | POA: Diagnosis not present

## 2019-05-13 DIAGNOSIS — I480 Paroxysmal atrial fibrillation: Secondary | ICD-10-CM | POA: Diagnosis not present

## 2019-05-13 DIAGNOSIS — J432 Centrilobular emphysema: Secondary | ICD-10-CM | POA: Diagnosis not present

## 2019-05-13 DIAGNOSIS — I5032 Chronic diastolic (congestive) heart failure: Secondary | ICD-10-CM

## 2019-05-13 DIAGNOSIS — R7309 Other abnormal glucose: Secondary | ICD-10-CM | POA: Insufficient documentation

## 2019-05-13 DIAGNOSIS — G4733 Obstructive sleep apnea (adult) (pediatric): Secondary | ICD-10-CM | POA: Diagnosis not present

## 2019-05-13 DIAGNOSIS — I10 Essential (primary) hypertension: Secondary | ICD-10-CM | POA: Diagnosis not present

## 2019-05-13 NOTE — Assessment & Plan Note (Signed)
Well-controlled.  Continue current regimen. 

## 2019-05-13 NOTE — Assessment & Plan Note (Signed)
Tolerating CPAP.  No hypersomnia.  He will continue CPAP.

## 2019-05-13 NOTE — Assessment & Plan Note (Signed)
Likely elevated related to steroids that he has been on.  We will have him come in for an A1c.

## 2019-05-13 NOTE — Assessment & Plan Note (Signed)
Doing well.  Rarely has to take Lasix.  We will continue to monitor.

## 2019-05-13 NOTE — Progress Notes (Signed)
Virtual Visit via telephone Note  This visit type was conducted due to national recommendations for restrictions regarding the COVID-19 pandemic (e.g. social distancing).  This format is felt to be most appropriate for this patient at this time.  All issues noted in this document were discussed and addressed.  No physical exam was performed (except for noted visual exam findings with Video Visits).   I connected with Britt Boozer today at  9:00 AM EDT by telephone and verified that I am speaking with the correct person using two identifiers. Location patient: home Location provider: work  Persons participating in the virtual visit: patient, provider, Jeronimo Greaves  I discussed the limitations, risks, security and privacy concerns of performing an evaluation and management service by telephone and the availability of in person appointments. I also discussed with the patient that there may be a patient responsible charge related to this service. The patient expressed understanding and agreed to proceed.  Interactive audio and video telecommunications were attempted between this provider and patient, however failed, due to patient having technical difficulties OR patient did not have access to video capability.  We continued and completed visit with audio only.   Reason for visit: follow-up  HPI: HYPERTENSION  Disease Monitoring  Home BP Monitoring 133/67 Chest pain- no    Dyspnea- chronic from COPD Medications  Compliance-  Taking metoprolol.  Edema- rare 1x/month  COPD: Medication compliance- taking symbicort  Rescue inhaler use- occasionally with walking up hill from his barn, 1x/month Dyspnea- chronic and stable  Wheezing- rare Cough- rare  Productive- no  Afib: taking eliquis and metoprolol. No bleeding issues. No palpitations.   CHF: no orthopnea. No PND. Rare edema. Takes lasix about once a month.   OSA: wearing CPAP 5-6 hours a night. Wakes well rested. No hypersomnia.    Elevated glucose: Noted on lab work.  He is taking steroids.  No polyuria or polydipsia.   ROS: See pertinent positives and negatives per HPI.  Past Medical History:  Diagnosis Date  . Anxiety   . Arthritis   . Atrial fibrillation ([place])    a. Dx 2013, recurred 02/2014, CHA2DS2VASc = 3 -->placed on Eliquis;  b. 02/2014 Echo: EF 50-55%, mid and apical anterior septum and mid and apical inf septum are abnl, mild to mod Ao sclerosis w/o AS.  Marland Kitchen Chicken pox   . Chronic lymphocytic leukemia ([place])    a. Dx 02/2014.  Marland Kitchen Complication of anesthesia    History of  PTSD--do not touch patient when waking up from surgery.  Marland Kitchen COPD (chronic obstructive pulmonary disease) ([place])   . Coronary artery disease    a. 04/2009 CABG x 3 (LIMA->LAD, VG->OM1, VG->PDA);  b. 09/2009 Cath: occluded VG x 2 w/ patent LIMA and L->R collats. EF 55%, mild antlat HK;  c. 10/2011 MV: EF 53%, no isch/infarct-->low risk.  Marland Kitchen Dysrhythmia    hx of a-fib  . GERD (gastroesophageal reflux disease)    occasional  . History of chemotherapy 2015-2016  . HOH (hard of hearing)    Bilateral Hearing Aids  . Hypertension   . Myocardial infarction ([place]) 2010  . OSA on CPAP    USE C-PAP  . Presence of permanent cardiac pacemaker 2017  . PTSD (post-traumatic stress disorder)   . PTSD (post-traumatic stress disorder)   . Pure hypercholesterolemia   . Rheumatic fever 1959  . 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  ABDOMINAL HERNIA REPAIR    . APPENDECTOMY  06/21/1985  . CARDIAC CATHETERIZATION  2010; 2011   ; Dr Fletcher Anon  . CORONARY ARTERY BYPASS GRAFT  04/2009   "CABG X3"  . EP IMPLANTABLE DEVICE N/A 03/03/2016   Procedure: Pacemaker Implant;  Surgeon: Deboraha Sprang, MD;  Location: [place] [facility];  Service: Cardiovascular;  Laterality: N/A;  . FOREIGN BODY REMOVAL  1968   "shrapnel in my tailbone"  . INGUINAL HERNIA REPAIR Right   . INSERT / REPLACE / REMOVE  PACEMAKER    . JOINT REPLACEMENT Right 2018  . LAPAROSCOPIC CHOLECYSTECTOMY    . TONSILLECTOMY AND ADENOIDECTOMY  1956  . TOTAL HIP ARTHROPLASTY Right 10/22/2016   Procedure: TOTAL HIP ARTHROPLASTY;  Surgeon: Dereck Leep, MD;  Location: [facility];  Service: Orthopedics;  Laterality: Right;  . TOTAL HIP ARTHROPLASTY Left 11/04/2017   Procedure: TOTAL HIP ARTHROPLASTY;  Surgeon: Dereck Leep, MD;  Location: [facility];  Service: Orthopedics;  Laterality: Left;    Family History  Problem Relation Age of Onset  . Heart disease Mother   . Heart attack Mother   . Coronary artery disease Other        family history    SOCIAL HX: former smoker   Current Outpatient Medications:  .  acetaminophen (TYLENOL) 500 MG tablet, Take 1,000 mg by mouth every 8 (eight) hours as needed for mild pain., Disp: , Rfl:  .  acyclovir (ZOVIRAX) 400 MG tablet, TAKE 1 TABLET BY MOUTH ONCE DAILY TO  PREVENT  SHINGLES, Disp: 30 tablet, Rfl: 0 .  albuterol (PROVENTIL HFA;VENTOLIN HFA) 108 (90 Base) MCG/ACT inhaler, Inhale 2 puffs into the lungs every 6 (six) hours as needed for wheezing or shortness of breath., Disp: 1 Inhaler, Rfl: 11 .  allopurinol (ZYLOPRIM) 300 MG tablet, Take 1 tablet by mouth twice daily, Disp: 120 tablet, Rfl: 3 .  amiodarone (PACERONE) 200 MG tablet, TAKE 1/2 TABLET (100 mg)  BY MOUTH TWICE DAILY, Disp: 90 tablet, Rfl: 2 .  apixaban (ELIQUIS) 5 MG TABS tablet, Take 5 mg by mouth 2 (two) times daily., Disp: , Rfl:  .  atorvastatin (LIPITOR) 80 MG tablet, Take 1 tablet (80 mg total) by mouth at bedtime., Disp: 90 tablet, Rfl: 3 .  budesonide-formoterol (SYMBICORT) 160-4.5 MCG/ACT inhaler, Inhale 2 puffs into the lungs 2 (two) times daily., Disp: 1 Inhaler, Rfl: 11 .  cetirizine (ZYRTEC) 10 MG tablet, Take 10 mg by mouth daily as needed for allergies. , Disp: , Rfl:  .  Coenzyme Q10 (COQ10) 200 MG CAPS, Take 200 mg by mouth daily., Disp: , Rfl:  .  dexamethasone (DECADRON) 4 MG tablet, Start 2  days prior to infusion; Take for 2 days. Do not take on the day of infusion., Disp: 60 tablet, Rfl: 3 .  ezetimibe (ZETIA) 10 MG tablet, Take 1 tablet (10 mg total) by mouth daily., Disp: 90 tablet, Rfl: 3 .  furosemide (LASIX) 20 MG tablet, Take 1 tablet (20 mg) by mouth twice daily as needed, Disp: , Rfl:  .  Krill Oil 350 MG CAPS, Take 350 mg by mouth every evening., Disp: , Rfl:  .  metoprolol tartrate (LOPRESSOR) 25 MG tablet, Take 25 mg by mouth 2 (two) times daily., Disp: , Rfl:  .  mirtazapine (REMERON) 15 MG tablet, Take 15 mg by mouth at bedtime as needed (for panic associated with PTSD). , Disp: , Rfl:  .  montelukast (SINGULAIR) 10 MG tablet, Take 1  tablet (10 mg total) by mouth at bedtime. Start 2 days prior to infusion. Take it for 4 days., Disp: 60 tablet, Rfl: 0 .  Multiple Vitamin (MULTIVITAMIN WITH MINERALS) TABS tablet, Take 1 tablet by mouth daily., Disp: , Rfl:  .  mupirocin ointment (BACTROBAN) 2 %, Apply 1 application topically 3 (three) times daily., Disp: 30 g, Rfl: 1 .  nitroGLYCERIN (NITROSTAT) 0.4 MG SL tablet, Place 1 tablet (0.4 mg total) under the tongue every 5 (five) minutes as needed for chest pain., Disp: 25 tablet, Rfl: 6 .  tiotropium (SPIRIVA) 18 MCG inhalation capsule, Place 1 capsule (18 mcg total) into inhaler and inhale at bedtime., Disp: 30 capsule, Rfl: 11 .  traMADol (ULTRAM) 50 MG tablet, TAKE 1 TABLET BY MOUTH EVERY 12 HOURS AS NEEDED FOR MODERATE PAIN, Disp: 90 tablet, Rfl: 0 .  venetoclax (VENCLEXTA) 100 MG TABS, Take 200 mg by mouth daily. (Take 2 tablets daily), Disp: 60 tablet, Rfl: 6  EXAM: This was a telehealth telephone visit and thus no physical exam was completed.   ASSESSMENT AND PLAN:  Discussed the following assessment and plan:  Essential hypertension Adequately controlled. Continue current medications.   Chronic diastolic CHF (congestive heart failure) ([place]) Doing well.  Rarely has to take Lasix.  We will continue to monitor.   Paroxysmal atrial fibrillation (H[facility]) Asymptomatic.  Reports rate has been controlled well.  We will continue his current regimen.  Discussed bleeding precautions.  COPD (chronic obstructive pulmonary disease) Well-controlled.  Continue current regimen.  OSA on CPAP Tolerating CPAP.  No hypersomnia.  He will continue CPAP.  Elevated glucose Likely elevated related to steroids that he has been on.  We will have him come in for an A1c.    I discussed the assessment and treatment plan with the patient. The patient was provided an opportunity to ask questions and all were answered. The patient agreed with the plan and demonstrated an understanding of the instructions.   The patient was advised to call back or seek an in-person evaluation if the symptoms worsen or if the condition fails to improve as anticipated.  I provided 14 minutes of non-face-to-face time during this encounter.   [name], MD

## 2019-05-13 NOTE — Assessment & Plan Note (Signed)
Adequately controlled.  Continue current medications.

## 2019-05-13 NOTE — Assessment & Plan Note (Signed)
Asymptomatic.  Reports rate has been controlled well.  We will continue his current regimen.  Discussed bleeding precautions.

## 2019-05-16 ENCOUNTER — Telehealth: Payer: Self-pay | Admitting: Family Medicine

## 2019-05-16 NOTE — Telephone Encounter (Signed)
Pt will be going to the cancer center to have blood work done. Can those labs be used for the A1c or does he needs labs to be done here with [place]? Please advise?

## 2019-05-17 ENCOUNTER — Ambulatory Visit: Payer: Medicare HMO | Admitting: Physical Therapy

## 2019-05-19 ENCOUNTER — Encounter: Payer: Self-pay | Admitting: Physical Therapy

## 2019-05-19 ENCOUNTER — Ambulatory Visit: Payer: Medicare HMO | Admitting: Physical Therapy

## 2019-05-19 ENCOUNTER — Other Ambulatory Visit: Payer: Self-pay

## 2019-05-19 DIAGNOSIS — R262 Difficulty in walking, not elsewhere classified: Secondary | ICD-10-CM

## 2019-05-19 DIAGNOSIS — M6281 Muscle weakness (generalized): Secondary | ICD-10-CM | POA: Diagnosis not present

## 2019-05-19 MED FILL — VENCLEXTA 100 MG TABS: 100 | 30 days supply | Qty: 60 | Fill #1

## 2019-05-19 NOTE — Therapy (Signed)
[place] MAIN [facility] SERVICES [address] [place], [place], [address] Phone: [phone]   Fax:  [phone]  Physical Therapy Treatment  Patient Details  Name: [name] MRN: [id number] Date of Birth: Feb 24, 1946 Referring Provider (PT): Cindra Presume   Encounter Date: 05/19/2019  PT End of Session - 05/19/19 0931    Visit Number  15    Number of Visits  33    Date for PT Re-Evaluation  06/28/19    PT Start Time  0932    PT Stop Time  1015    PT Time Calculation (min)  43 min    Equipment Utilized During Treatment  Gait belt    Activity Tolerance  Patient tolerated treatment well;No increased pain    Behavior During Therapy  WFL for tasks assessed/performed       Past Medical History:  Diagnosis Date  . Anxiety   . Arthritis   . Atrial fibrillation ([place])    a. Dx 2013, recurred 02/2014, CHA2DS2VASc = 3 -->placed on Eliquis;  b. 02/2014 Echo: EF 50-55%, mid and apical anterior septum and mid and apical inf septum are abnl, mild to mod Ao sclerosis w/o AS.  Marland Kitchen Chicken pox   . Chronic lymphocytic leukemia ([place])    a. Dx 02/2014.  Marland Kitchen Complication of anesthesia    History of  PTSD--do not touch patient when waking up from surgery.  Marland Kitchen COPD (chronic obstructive pulmonary disease) ([place])   . Coronary artery disease    a. 04/2009 CABG x 3 (LIMA->LAD, VG->OM1, VG->PDA);  b. 09/2009 Cath: occluded VG x 2 w/ patent LIMA and L->R collats. EF 55%, mild antlat HK;  c. 10/2011 MV: EF 53%, no isch/infarct-->low risk.  Marland Kitchen Dysrhythmia    hx of a-fib  . GERD (gastroesophageal reflux disease)    occasional  . History of chemotherapy 2015-2016  . HOH (hard of hearing)    Bilateral Hearing Aids  . Hypertension   . Myocardial infarction ([place]) 2010  . OSA on CPAP    USE C-PAP  . Presence of permanent cardiac pacemaker 2017  . PTSD (post-traumatic stress disorder)   . PTSD (post-traumatic stress disorder)   . Pure hypercholesterolemia   .  Rheumatic fever 1959  . 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 ABDOMINAL HERNIA REPAIR    . APPENDECTOMY  06/21/1985  . CARDIAC CATHETERIZATION  2010; 2011   ; Dr Fletcher Anon  . CORONARY ARTERY BYPASS GRAFT  04/2009   "CABG X3"  . EP IMPLANTABLE DEVICE N/A 03/03/2016   Procedure: Pacemaker Implant;  Surgeon: Deboraha Sprang, MD;  Location: [place] [facility];  Service: Cardiovascular;  Laterality: N/A;  . FOREIGN BODY REMOVAL  1968   "shrapnel in my tailbone"  . INGUINAL HERNIA REPAIR Right   . INSERT / REPLACE / REMOVE PACEMAKER    . JOINT REPLACEMENT Right 2018  . LAPAROSCOPIC CHOLECYSTECTOMY    . TONSILLECTOMY AND ADENOIDECTOMY  1956  . TOTAL HIP ARTHROPLASTY Right 10/22/2016   Procedure: TOTAL HIP ARTHROPLASTY;  Surgeon: Dereck Leep, MD;  Location: [facility] OR[facility];  Service: Orthopedics;  Laterality: Right;  . TOTAL HIP ARTHROPLASTY Left 11/04/2017   Procedure: TOTAL HIP ARTHROPLASTY;  Surgeon: Dereck Leep, MD;  Location: [facility] OR[facility];  Service: Orthopedics;  Laterality: Left;    There were no vitals filed for this visit.  Subjective Assessment - 05/19/19 0932  Subjective  Patient states he is having bad knee pain today due to the rain and shift in temperature. Thinks he sees his PCP next on 11/4 or 11/5.    Pertinent History  Patient is having pain in BLE legs , but it never goes away. He is able to do his HEP.    Currently in Pain?  Yes    Pain Score  10-Worst pain ever    Pain Location  Knee    Pain Orientation  Left;Right    Pain Descriptors / Indicators  Aching;Constant    Pain Type  Chronic pain    Pain Radiating Towards  na    Pain Onset  More than a month ago    Pain Frequency  Constant    Aggravating Factors   weather    Pain Relieving Factors  na    Effect of Pain on Daily Activities  na    Multiple Pain Sites  No      Neuro Re-ed: Patient requires CGA during all standing interventions due to limited  stability and patient fear of LOB. Cueing for reduction of UE support required as well as postural alignment for optimal stability within COM   This entire session was performed under direct supervision and direction of a licensed therapist/therapist assistant . I have personally read, edited and approve of the note as written. TREATMENT:  -Warmup Octane fitness x 5 mins L 4  NMRE:  Standing march on airex foam x20  Step over hurdle fwd/bwd, side/side x15 each direction; cues to take both steps quickly to limit time spent in half stance  Tandem gait across long airex foam x5 laps fwd/bwd  Side-step across long airex foam x5 laps side/side  Semi-tandem stance on airex foam with ball passes 2x1 min; more difficulty with catching when reaching laterally  1/2 foam roll balance with flat side up 30s x 2 reps  1/2 foam roll balance with flat side down 30s x 2 reps  1/2 foam roll tandem balance alternating forward LE 30s x 2 each LE forward  Standing quad stretch with foot on chair posteriorly and // bars for support 2x30 sec BLE; cues to lower hips slightly to relieve quad tightness and reduce pain.    Pt educated throughout session about proper posture and technique with exercises. Improved exercise technique, movement at target joints, use of target muscles after min to mod verbal, visual, tactile cues.   Patient demonstrates excellent motivation throughout today's session. Patient was able to perform static and dynamic balance intervention, required occasional UE support. Patient continues to be challenged by instability of gait and balance on narrow BOS and compliant surfaces, which improved with repetition and min VC for sequencing. Patient would continue to benefit from skilled PT to address the deficits outlined in this note.      PT Education - 05/19/19 0931    Education Details  HEP, Balance    Person(s) Educated  Patient    Methods  Explanation;Demonstration    Comprehension   Verbalized understanding;Returned demonstration;Need further instruction       PT Short Term Goals - 04/18/19 1118      PT SHORT TERM GOAL #1   Title  Patient will be independent in home exercise program to improve strength/mobility for better functional independence with ADLs.    Baseline  Patient partially independent, needs further updating for his HEP.    Time  4    Period  Weeks    Status  On-going  Target Date  05/03/19      PT SHORT TERM GOAL #2   Title  Patient (< 58 years old) will complete five times sit to stand test in < 10 seconds indicating an increased LE strength and improved balance.    Baseline  04/18/2019: 22secs    Time  4    Period  Weeks    Status  New    Target Date  05/03/19        PT Long Term Goals - 04/25/19 0941      PT LONG TERM GOAL #1   Title  Patient (> 81 years old) will complete five times sit to stand test in < 15 seconds indicating an increased LE strength and improved balance.    Baseline  04/25/19=44 .07sec    Time  8    Period  Weeks    Status  Partially Met    Target Date  06/28/19      PT LONG TERM GOAL #2   Title  Patient will increase Berg Balance score by > 6 points to demonstrate decreased fall risk during functional activities.    Baseline  04/25/19=49/56    Time  8    Period  Weeks    Status  Partially Met    Target Date  06/28/19      PT LONG TERM GOAL #3   Title  Patient will increase 10 meter walk test to >1.93ms as to improve gait speed for better community ambulation and to reduce fall risk.    Baseline  04/25/19=.71 m/sec    Time  8    Period  Weeks    Status  Partially Met    Target Date  06/28/19            Plan - 05/19/19 1129    Clinical Impression Statement  Patient demonstrates excellent motivation throughout today's session. Patient was able to perform static and dynamic balance intervention, required occasional UE support. Patient continues to be challenged by instability of gait and balance on  narrow BOS and compliant surfaces, which improved with repetition and min VC for sequencing. Patient would continue to benefit from skilled PT to address the deficits outlined in this note.    Personal Factors and Comorbidities  Age;Comorbidity 1    Comorbidities  COPD,    Examination-Participation Restrictions  Yard Work;Cleaning;Community Activity    Stability/Clinical Decision Making  Evolving/Moderate complexity    Rehab Potential  Fair    PT Frequency  2x / week    PT Duration  8 weeks    PT Treatment/Interventions  Gait training;Therapeutic activities;Balance training;Therapeutic exercise;Patient/family education    PT Next Visit Plan  strengthening and balance    PT Home Exercise Plan  heel raises, hip abd, hip ext YTB    Consulted and Agree with Plan of Care  Patient       Patient will benefit from skilled therapeutic intervention in order to improve the following deficits and impairments:  Abnormal gait, Decreased balance, Decreased mobility, Difficulty walking, Cardiopulmonary status limiting activity, Decreased activity tolerance, Decreased strength, Pain  Visit Diagnosis: Difficulty in walking, not elsewhere classified  Muscle weakness (generalized)     Problem List Patient Active Problem List   Diagnosis Date Noted  . Elevated glucose 05/13/2019  . Dehydration 03/14/2019  . Gross hematuria 05/12/2018  . Sick sinus syndrome ([place] 07/08/2016  . Goals of care, counseling/discussion 07/08/2016  . 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 Chronic fatigue   .  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 PAD (peripheral artery disease) ([place])   . OSA on CPAP   . TIA (transient ischemic attack) 11/02/2015  . Chronic diastolic CHF (congestive heart failure) ([place]) 09/20/2015  . CLL (chronic lymphocytic leukemia) ([place])   . PTSD (post-traumatic stress disorder)   . Osteoarthritis of both knees 07/05/2015  . COPD (chronic obstructive pulmonary  disease) ([place]) 01/01/2012  . Shortness of breath 10/09/2011  . Paroxysmal atrial fibrillation ([place]) 10/09/2011  . Hyperlipidemia 11/28/2009  . Essential hypertension 11/28/2009   Jeneen Rinks A. [address] SPT  [place], [place], [place] DPT 05/19/2019, 1:28 PM  [place] MAIN [facility] SERVICES [address] [place], [place], [address] Phone: [phone]   Fax:  [phone]  Name: [name] MRN: [id number] Date of Birth: 22-Nov-1945

## 2019-05-20 ENCOUNTER — Other Ambulatory Visit: Payer: Self-pay | Admitting: Internal Medicine

## 2019-05-23 ENCOUNTER — Other Ambulatory Visit: Payer: Self-pay

## 2019-05-23 ENCOUNTER — Ambulatory Visit: Payer: Medicare HMO | Attending: Internal Medicine | Admitting: Physical Therapy

## 2019-05-23 ENCOUNTER — Encounter: Payer: Self-pay | Admitting: Physical Therapy

## 2019-05-23 DIAGNOSIS — M6281 Muscle weakness (generalized): Secondary | ICD-10-CM | POA: Diagnosis not present

## 2019-05-23 DIAGNOSIS — R262 Difficulty in walking, not elsewhere classified: Secondary | ICD-10-CM | POA: Diagnosis not present

## 2019-05-23 NOTE — Therapy (Signed)
[place] MAIN [facility] SERVICES [address] [place], [place], [address] Phone: [phone]   Fax:  [phone]  Physical Therapy Treatment  Patient Details  Name: [name] MRN: [id number] Date of Birth: 1946-04-14 Referring Provider (PT): Cindra Presume   Encounter Date: 05/23/2019  PT End of Session - 05/23/19 1003    Visit Number  16    Number of Visits  33    Date for PT Re-Evaluation  06/28/19    PT Start Time  0931    PT Stop Time  1015    PT Time Calculation (min)  44 min    Equipment Utilized During Treatment  Gait belt    Activity Tolerance  Patient tolerated treatment well;No increased pain    Behavior During Therapy  WFL for tasks assessed/performed       Past Medical History:  Diagnosis Date  . Anxiety   . Arthritis   . Atrial fibrillation ([place])    a. Dx 2013, recurred 02/2014, CHA2DS2VASc = 3 -->placed on Eliquis;  b. 02/2014 Echo: EF 50-55%, mid and apical anterior septum and mid and apical inf septum are abnl, mild to mod Ao sclerosis w/o AS.  Marland Kitchen Chicken pox   . Chronic lymphocytic leukemia ([place])    a. Dx 02/2014.  Marland Kitchen Complication of anesthesia    History of  PTSD--do not touch patient when waking up from surgery.  Marland Kitchen COPD (chronic obstructive pulmonary disease) ([place])   . Coronary artery disease    a. 04/2009 CABG x 3 (LIMA->LAD, VG->OM1, VG->PDA);  b. 09/2009 Cath: occluded VG x 2 w/ patent LIMA and L->R collats. EF 55%, mild antlat HK;  c. 10/2011 MV: EF 53%, no isch/infarct-->low risk.  Marland Kitchen Dysrhythmia    hx of a-fib  . GERD (gastroesophageal reflux disease)    occasional  . History of chemotherapy 2015-2016  . HOH (hard of hearing)    Bilateral Hearing Aids  . Hypertension   . Myocardial infarction ([place]) 2010  . OSA on CPAP    USE C-PAP  . Presence of permanent cardiac pacemaker 2017  . PTSD (post-traumatic stress disorder)   . PTSD (post-traumatic stress disorder)   . Pure hypercholesterolemia   .  Rheumatic fever 1959  . 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 ABDOMINAL HERNIA REPAIR    . APPENDECTOMY  06/21/1985  . CARDIAC CATHETERIZATION  2010; 2011   ; Dr Fletcher Anon  . CORONARY ARTERY BYPASS GRAFT  04/2009   "CABG X3"  . EP IMPLANTABLE DEVICE N/A 03/03/2016   Procedure: Pacemaker Implant;  Surgeon: Deboraha Sprang, MD;  Location: [place] [facility];  Service: Cardiovascular;  Laterality: N/A;  . FOREIGN BODY REMOVAL  1968   "shrapnel in my tailbone"  . INGUINAL HERNIA REPAIR Right   . INSERT / REPLACE / REMOVE PACEMAKER    . JOINT REPLACEMENT Right 2018  . LAPAROSCOPIC CHOLECYSTECTOMY    . TONSILLECTOMY AND ADENOIDECTOMY  1956  . TOTAL HIP ARTHROPLASTY Right 10/22/2016   Procedure: TOTAL HIP ARTHROPLASTY;  Surgeon: Dereck Leep, MD;  Location: [facility] OR[facility];  Service: Orthopedics;  Laterality: Right;  . TOTAL HIP ARTHROPLASTY Left 11/04/2017   Procedure: TOTAL HIP ARTHROPLASTY;  Surgeon: Dereck Leep, MD;  Location: [facility] OR[facility];  Service: Orthopedics;  Laterality: Left;    There were no vitals filed for this visit.  Subjective Assessment - 05/23/19 9774  Subjective  Patient reports increased leg pain today. He reports that it is pretty constant from arthritis. He reports his balance is so-so. no new falls;    Pertinent History  Patient is having pain in BLE legs , but it never goes away. He is able to do his HEP.    Currently in Pain?  Yes    Pain Score  9     Pain Location  Leg    Pain Orientation  Right;Left    Pain Descriptors / Indicators  Aching;Constant    Pain Type  Chronic pain    Pain Onset  More than a month ago    Pain Frequency  Constant    Aggravating Factors   weather    Pain Relieving Factors  NA    Effect of Pain on Daily Activities  works through the pain;    Multiple Pain Sites  No            Neuro Re-ed: Patient requires CGA during all standing interventions due to limited stability  and patient fear of LOB. Cueing for reduction of UE support required as well as postural alignment for optimal stability within COM   TREATMENT:  -Warmup Octane fitness x 5 mins L 2 (unbilled)  NMRE:  Patient instructed in advanced balance exercise  Standing in front of steps:  Standing on airex foam: -alternate toe taps to 4 inch step with 2-1 rail assist x15 reps bilaterally; -Standing one foot on airex, one foot on 4 inch step, unsupported head turns side/side x5 reps each foot on step -Heel/toe raises x15 reps with 2-1 rail assist for balance Patient required min VCs for balance stability, including to increase trunk control for less loss of balance with smaller base of support Requires CGA to min A for safety;  1/2 foam roll balance with flat side up 30sx 1 reps, progressed to unsupported with alternate UE lift x5 reps each UE with min A for safety;   1/2 foam roll balance with flat side down 30sx 1 reps, progressed to unsupported with head turns up/down x5 reps with min A for safety;   1/2 foam roll tandem balance alternating forward LE 15s x 2 eachLE forward  Standing on firm surface: Tandem stance unsupported 30 sec hold x1 rep each foot in front; Patient has increased difficulty with RLE ahead of LLE due to weakness in right hip;   4 square stepping: Clockwise/counterclockwise x3 reps each direction unsupported, CGA for safety; Multiple directional stepping x2 min with min A for safety; min Vcs to increase step length for better foot clearance and sequencing;   Patient demonstrates excellent motivation throughout today's session. Patient was able to perform static and dynamic balance intervention, required occasional UE support. Patient continues to be challenged by instability of gait and balance on narrow BOS and compliant surfaces, which improved with repetition and min VC for sequencing. Patient would continue to benefit from skilled PT to address the deficits  outlined in this note.   He continues to have increased soreness in BLE; utilized rolling stick to reduce tightness/soreness in anterior thigh with good tolerance; x1 min each LE;                      PT Education - 05/23/19 1003    Education Details  Balance/safety, HEP    Person(s) Educated  Patient    Methods  Explanation;Verbal cues    Comprehension  Verbalized understanding;Returned demonstration;Verbal cues required;Need further instruction  PT Short Term Goals - 04/18/19 1118      PT SHORT TERM GOAL #1   Title  Patient will be independent in home exercise program to improve strength/mobility for better functional independence with ADLs.    Baseline  Patient partially independent, needs further updating for his HEP.    Time  4    Period  Weeks    Status  On-going    Target Date  05/03/19      PT SHORT TERM GOAL #2   Title  Patient (< 31 years old) will complete five times sit to stand test in < 10 seconds indicating an increased LE strength and improved balance.    Baseline  04/18/2019: 22secs    Time  4    Period  Weeks    Status  New    Target Date  05/03/19        PT Long Term Goals - 04/25/19 0941      PT LONG TERM GOAL #1   Title  Patient (> 45 years old) will complete five times sit to stand test in < 15 seconds indicating an increased LE strength and improved balance.    Baseline  04/25/19=44 .07sec    Time  8    Period  Weeks    Status  Partially Met    Target Date  06/28/19      PT LONG TERM GOAL #2   Title  Patient will increase Berg Balance score by > 6 points to demonstrate decreased fall risk during functional activities.    Baseline  04/25/19=49/56    Time  8    Period  Weeks    Status  Partially Met    Target Date  06/28/19      PT LONG TERM GOAL #3   Title  Patient will increase 10 meter walk test to >1.17ms as to improve gait speed for better community ambulation and to reduce fall risk.    Baseline  04/25/19=.71 m/sec     Time  8    Period  Weeks    Status  Partially Met    Target Date  06/28/19            Plan - 05/23/19 1024    Clinical Impression Statement  Patient motivated and participated well this session; Patient does report increased anterior thigh discomfort with prolonged standing/activity; He does require short sitting rest break for comfort. Patient instructed in advanced balance exercise. He does require CGA to min A for safety especially with unsupported standing on compliant surfaces. He would benefit from additional skilled PT intervention to improve strength, balance and gait safety;    Personal Factors and Comorbidities  Age;Comorbidity 1    Comorbidities  COPD,    Examination-Participation Restrictions  Yard Work;Cleaning;Community Activity    Stability/Clinical Decision Making  Evolving/Moderate complexity    Rehab Potential  Fair    PT Frequency  2x / week    PT Duration  8 weeks    PT Treatment/Interventions  Gait training;Therapeutic activities;Balance training;Therapeutic exercise;Patient/family education    PT Next Visit Plan  strengthening and balance    PT Home Exercise Plan  heel raises, hip abd, hip ext YTB    Consulted and Agree with Plan of Care  Patient       Patient will benefit from skilled therapeutic intervention in order to improve the following deficits and impairments:  Abnormal gait, Decreased balance, Decreased mobility, Difficulty walking, Cardiopulmonary status limiting activity, Decreased activity tolerance,  Decreased strength, Pain  Visit Diagnosis: Difficulty in walking, not elsewhere classified  Muscle weakness (generalized)     Problem List Patient Active Problem List   Diagnosis Date Noted  . Elevated glucose 05/13/2019  . Dehydration 03/14/2019  . Gross hematuria 05/12/2018  . Sick sinus syndrome ([place]) 07/08/2016  . Goals of care, counseling/discussion 07/08/2016  . 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 Chronic fatigue   .  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 PAD (peripheral artery disease) ([place])   . OSA on CPAP   . TIA (transient ischemic attack) 11/02/2015  . Chronic diastolic CHF (congestive heart failure) ([place]) 09/20/2015  . CLL (chronic lymphocytic leukemia) ([place])   . PTSD (post-traumatic stress disorder)   . Osteoarthritis of both knees 07/05/2015  . COPD (chronic obstructive pulmonary disease) ([place]) 01/01/2012  . Shortness of breath 10/09/2011  . Paroxysmal atrial fibrillation ([place]) 10/09/2011  . Hyperlipidemia 11/28/2009  . Essential hypertension 11/28/2009    Krisanne Lich PT, DPT 05/23/2019, 10:29 AM  [place] MAIN [facility] SERVICES [address] [place], [place], [address] Phone: [phone]   Fax:  [phone]  Name: [name] MRN: [id number] Date of Birth: 1945-08-17

## 2019-05-25 ENCOUNTER — Ambulatory Visit: Payer: Medicare HMO | Admitting: Physical Therapy

## 2019-05-25 ENCOUNTER — Other Ambulatory Visit: Payer: Self-pay

## 2019-05-25 ENCOUNTER — Encounter: Payer: Self-pay | Admitting: Physical Therapy

## 2019-05-25 DIAGNOSIS — R262 Difficulty in walking, not elsewhere classified: Secondary | ICD-10-CM | POA: Diagnosis not present

## 2019-05-25 DIAGNOSIS — M6281 Muscle weakness (generalized): Secondary | ICD-10-CM | POA: Diagnosis not present

## 2019-05-25 NOTE — Therapy (Signed)
[facility] MAIN [facility] SERVICES [address] [place], [place], [address] Phone: [phone]   Fax:  [phone]  Physical Therapy Treatment  Patient Details  Name: [name] MRN: [id number] Date of Birth: Sep 29, 1945 Referring Provider (PT): Cindra Presume   Encounter Date: 05/25/2019  PT End of Session - 05/25/19 1008    Visit Number  17    Number of Visits  33    Date for PT Re-Evaluation  06/28/19    PT Start Time  0935    PT Stop Time  1015    PT Time Calculation (min)  40 min    Equipment Utilized During Treatment  Gait belt    Activity Tolerance  Patient tolerated treatment well;No increased pain    Behavior During Therapy  WFL for tasks assessed/performed       Past Medical History:  Diagnosis Date  . Anxiety   . Arthritis   . Atrial fibrillation ([place])    a. Dx 2013, recurred 02/2014, CHA2DS2VASc = 3 -->placed on Eliquis;  b. 02/2014 Echo: EF 50-55%, mid and apical anterior septum and mid and apical inf septum are abnl, mild to mod Ao sclerosis w/o AS.  Marland Kitchen Chicken pox   . Chronic lymphocytic leukemia ([place])    a. Dx 02/2014.  Marland Kitchen Complication of anesthesia    History of  PTSD--do not touch patient when waking up from surgery.  Marland Kitchen COPD (chronic obstructive pulmonary disease) ([place])   . Coronary artery disease    a. 04/2009 CABG x 3 (LIMA->LAD, VG->OM1, VG->PDA);  b. 09/2009 Cath: occluded VG x 2 w/ patent LIMA and L->R collats. EF 55%, mild antlat HK;  c. 10/2011 MV: EF 53%, no isch/infarct-->low risk.  Marland Kitchen Dysrhythmia    hx of a-fib  . GERD (gastroesophageal reflux disease)    occasional  . History of chemotherapy 2015-2016  . HOH (hard of hearing)    Bilateral Hearing Aids  . Hypertension   . Myocardial infarction ([place]) 2010  . OSA on CPAP    USE C-PAP  . Presence of permanent cardiac pacemaker 2017  . PTSD (post-traumatic stress disorder)   . PTSD (post-traumatic stress disorder)   . Pure hypercholesterolemia   .  Rheumatic fever 1959  . 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 ABDOMINAL HERNIA REPAIR    . APPENDECTOMY  06/21/1985  . CARDIAC CATHETERIZATION  2010; 2011   ; Dr Fletcher Anon  . CORONARY ARTERY BYPASS GRAFT  04/2009   "CABG X3"  . EP IMPLANTABLE DEVICE N/A 03/03/2016   Procedure: Pacemaker Implant;  Surgeon: Deboraha Sprang, MD;  Location: [facility];  Service: Cardiovascular;  Laterality: N/A;  . FOREIGN BODY REMOVAL  1968   "shrapnel in my tailbone"  . INGUINAL HERNIA REPAIR Right   . INSERT / REPLACE / REMOVE PACEMAKER    . JOINT REPLACEMENT Right 2018  . LAPAROSCOPIC CHOLECYSTECTOMY    . TONSILLECTOMY AND ADENOIDECTOMY  1956  . TOTAL HIP ARTHROPLASTY Right 10/22/2016   Procedure: TOTAL HIP ARTHROPLASTY;  Surgeon: Dereck Leep, MD;  Location: [facility];  Service: Orthopedics;  Laterality: Right;  . TOTAL HIP ARTHROPLASTY Left 11/04/2017   Procedure: TOTAL HIP ARTHROPLASTY;  Surgeon: Dereck Leep, MD;  Location: [facility];  Service: Orthopedics;  Laterality: Left;    There were no vitals filed for this visit.  Subjective Assessment - 05/25/19 1006  Subjective  Patient reports  leg pain today. He reports that it is pretty constant from arthritis. He reports his balance is so-so. no new falls;    Pertinent History  Patient is having pain in BLE legs , but it never goes away. He is able to do his HEP.    Currently in Pain?  Yes    Pain Score  7     Pain Location  Leg    Pain Orientation  Right    Pain Descriptors / Indicators  Aching    Pain Radiating Towards  na    Pain Onset  More than a month ago    Aggravating Factors   weather    Pain Relieving Factors  na    Effect of Pain on Daily Activities  na            Treatment: Neuromuscular Training: Stool: staggered stance, head turns side/side, up/down x 5 reps each, each foot in front; VCs for proper technique and positioning for each  exercise staggered stance, trunk rotation with 2 lb rod, VC to keep UE straight and turn head with trunk  Blue Foam: Side stepping x10 on blue balance CGA for safety, VCs for taking a big enough step  Airex pad trunk rotation x2 min, CGA for safety, demonstrated difficulty with keeping arms extended and full rotation with head turn Airex pad, balloon tapping to mirror x2 min, supervision for safety with varying directions and speed of balloon, VCs for utilizing both hands and minimizing UE support  Floor Star exercise: Performed stepping on the star diagram on the floor. Working on weight-shifting forward onto the foot that stepped forward and then weight-shifting back and bringing her feet back together with CGA. Performed 2 reps each foot.   Matrix: Fwd/bwd gait with 22. 5 lbs and CGA, cues for posture and stepping strategies, occasional LOB Side stepping left and right and CGA with cues to slow movement Lateral side step weight shift x10 reps each direction, no UE support, CGA for safety with VCs to tap in each direction, controlling the speed Fwd step weight shift x10 reps, no UE support, CGA for safety with VCs to utilize ankles to shift weight     Pt educated throughout session about proper posture and technique with exercises. Improved exercise technique, movement at target joints, use of target muscles after min to mod verbal, visual, tactile cues. CGA and Min to mod verbal cues used throughout with increased in postural sway and LOB most seen with narrow base of support and while on uneven surfaces. Continues to have balance deficits typical with diagnosis.                 PT Education - 05/25/19 1008    Education Details  hep    Person(s) Educated  Patient    Methods  Explanation;Demonstration;Tactile cues;Verbal cues    Comprehension  Verbalized understanding;Returned demonstration;Need further instruction       PT Short Term Goals - 04/18/19 1118      PT  SHORT TERM GOAL #1   Title  Patient will be independent in home exercise program to improve strength/mobility for better functional independence with ADLs.    Baseline  Patient partially independent, needs further updating for his HEP.    Time  4    Period  Weeks    Status  On-going    Target Date  05/03/19      PT SHORT TERM GOAL #2   Title  Patient (<  43 years old) will complete five times sit to stand test in < 10 seconds indicating an increased LE strength and improved balance.    Baseline  04/18/2019: 22secs    Time  4    Period  Weeks    Status  New    Target Date  05/03/19        PT Long Term Goals - 04/25/19 0941      PT LONG TERM GOAL #1   Title  Patient (> 17 years old) will complete five times sit to stand test in < 15 seconds indicating an increased LE strength and improved balance.    Baseline  04/25/19=44 .07sec    Time  8    Period  Weeks    Status  Partially Met    Target Date  06/28/19      PT LONG TERM GOAL #2   Title  Patient will increase Berg Balance score by > 6 points to demonstrate decreased fall risk during functional activities.    Baseline  04/25/19=49/56    Time  8    Period  Weeks    Status  Partially Met    Target Date  06/28/19      PT LONG TERM GOAL #3   Title  Patient will increase 10 meter walk test to >1.72ms as to improve gait speed for better community ambulation and to reduce fall risk.    Baseline  04/25/19=.71 m/sec    Time  8    Period  Weeks    Status  Partially Met    Target Date  06/28/19            Plan - 05/25/19 1009    Clinical Impression Statement  Patient instructed in intermediate strengthening and balance exercise.  Patient requires min Vcs for correct exercise technique including to improve LE control with standing exercise. Patient demonstrates better ankle control with SLS tasks with rail assist. Patient would benefit from additional skilled PT intervention to improve balance/gait safety and reduce fall risk.     Personal Factors and Comorbidities  Age;Comorbidity 1    Comorbidities  COPD,    Examination-Participation Restrictions  Yard Work;Cleaning;Community Activity    Stability/Clinical Decision Making  Evolving/Moderate complexity    Rehab Potential  Fair    PT Frequency  2x / week    PT Duration  8 weeks    PT Treatment/Interventions  Gait training;Therapeutic activities;Balance training;Therapeutic exercise;Patient/family education    PT Next Visit Plan  strengthening and balance    PT Home Exercise Plan  heel raises, hip abd, hip ext YTB    Consulted and Agree with Plan of Care  Patient       Patient will benefit from skilled therapeutic intervention in order to improve the following deficits and impairments:  Abnormal gait, Decreased balance, Decreased mobility, Difficulty walking, Cardiopulmonary status limiting activity, Decreased activity tolerance, Decreased strength, Pain  Visit Diagnosis: Difficulty in walking, not elsewhere classified  Muscle weakness (generalized)     Problem List Patient Active Problem List   Diagnosis Date Noted  . Elevated glucose 05/13/2019  . Dehydration 03/14/2019  . Gross hematuria 05/12/2018  . [place] 07/08/[place]  . Goals of care, counseling/discussion 07/08/2016  . 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 Chronic fatigue   . 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 P[place] (peripheral artery disease) ([place]   . OSA on CPAP   . TIA (transient ischemic attack)  11/02/2015  . Chronic diastolic CHF (congestive heart failure) ([place]) 09/20/2015  . CLL (chronic lymphocytic leukemia) ([place])   . PTSD (post-traumatic stress disorder)   . Osteoarthritis of both knees 07/05/2015  . COPD (chronic obstructive pulmonary disease) ([place]) 01/01/2012  . Shortness of breath 10/09/2011  . Paroxysmal atrial fibrillation ([place]) 10/09/2011  . Hyperlipidemia 11/28/2009  . Essential hypertension 11/28/2009     Alanson Puls, PT DPT 05/25/2019, 10:11 AM  [place] MAIN [facility] SERVICES [address] [place], [place], [address] Phone: [phone]   Fax:  [phone]  Name: SHELLEY POOLEY MRN: [id number] Date of Birth: [birth year]

## 2019-05-31 ENCOUNTER — Encounter: Payer: Self-pay | Admitting: Physical Therapy

## 2019-05-31 ENCOUNTER — Other Ambulatory Visit: Payer: Self-pay

## 2019-05-31 ENCOUNTER — Ambulatory Visit: Payer: Medicare HMO | Admitting: Physical Therapy

## 2019-05-31 DIAGNOSIS — M6281 Muscle weakness (generalized): Secondary | ICD-10-CM

## 2019-05-31 DIAGNOSIS — R262 Difficulty in walking, not elsewhere classified: Secondary | ICD-10-CM

## 2019-05-31 NOTE — Therapy (Signed)
[place] MAIN [facility] SERVICES [address] [place], [place], [address] Phone: [phone]   Fax:  [phone]  Physical Therapy Treatment  Patient Details  Name: [name] MRN: [id number] Date of Birth: 13-Sep-1945 Referring Provider (PT): Cindra Presume   Encounter Date: 05/31/2019  PT End of Session - 05/31/19 0939    Visit Number  18    Number of Visits  33    Date for PT Re-Evaluation  06/28/19    PT Start Time  0934    PT Stop Time  1015    PT Time Calculation (min)  41 min    Equipment Utilized During Treatment  Gait belt    Activity Tolerance  Patient tolerated treatment well;No increased pain    Behavior During Therapy  WFL for tasks assessed/performed       Past Medical History:  Diagnosis Date  . Anxiety   . Arthritis   . Atrial fibrillation ([place])    a. Dx 2013, recurred 02/2014, CHA2DS2VASc = 3 -->placed on Eliquis;  b. 02/2014 Echo: EF 50-55%, mid and apical anterior septum and mid and apical inf septum are abnl, mild to mod Ao sclerosis w/o AS.  Marland Kitchen Chicken pox   . Chronic lymphocytic leukemia ([place])    a. Dx 02/2014.  Marland Kitchen Complication of anesthesia    History of  PTSD--do not touch patient when waking up from surgery.  Marland Kitchen COPD (chronic obstructive pulmonary disease) ([place])   . Coronary artery disease    a. 04/2009 CABG x 3 (LIMA->LAD, VG->OM1, VG->PDA);  b. 09/2009 Cath: occluded VG x 2 w/ patent LIMA and L->R collats. EF 55%, mild antlat HK;  c. 10/2011 MV: EF 53%, no isch/infarct-->low risk.  Marland Kitchen Dysrhythmia    hx of a-fib  . GERD (gastroesophageal reflux disease)    occasional  . History of chemotherapy 2015-2016  . HOH (hard of hearing)    Bilateral Hearing Aids  . Hypertension   . Myocardial infarction ([place]) 2010  . OSA on CPAP    USE C-PAP  . Presence of permanent cardiac pacemaker 2017  . PTSD (post-traumatic stress disorder)   . PTSD (post-traumatic stress disorder)   . Pure hypercholesterolemia   .  Rheumatic fever 1959  . 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 ABDOMINAL HERNIA REPAIR    . APPENDECTOMY  06/21/1985  . CARDIAC CATHETERIZATION  2010; 2011   ; Dr Fletcher Anon  . CORONARY ARTERY BYPASS GRAFT  04/2009   "CABG X3"  . EP IMPLANTABLE DEVICE N/A 03/03/2016   Procedure: Pacemaker Implant;  Surgeon: Deboraha Sprang, MD;  Location: [facility];  Service: Cardiovascular;  Laterality: N/A;  . FOREIGN BODY REMOVAL  1968   "shrapnel in my tailbone"  . INGUINAL HERNIA REPAIR Right   . INSERT / REPLACE / REMOVE PACEMAKER    . JOINT REPLACEMENT Right 2018  . LAPAROSCOPIC CHOLECYSTECTOMY    . TONSILLECTOMY AND ADENOIDECTOMY  1956  . TOTAL HIP ARTHROPLASTY Right 10/22/2016   Procedure: TOTAL HIP ARTHROPLASTY;  Surgeon: Dereck Leep, MD;  Location: [facility];  Service: Orthopedics;  Laterality: Right;  . TOTAL HIP ARTHROPLASTY Left 11/04/2017   Procedure: TOTAL HIP ARTHROPLASTY;  Surgeon: Dereck Leep, MD;  Location: [facility];  Service: Orthopedics;  Laterality: Left;    There were no vitals filed for this visit.  Subjective Assessment - 05/31/19 4403  Subjective  Patient reports increased leg pain today; He reports that he is going to see Dr. Rockey Situ to see if it could be a circulation problem. Although his pain could be worse from bad weather coming. Reports occasional stumbles but denies any new falls;    Pertinent History  Patient is having pain in BLE legs , but it never goes away. He is able to do his HEP.    Currently in Pain?  Yes    Pain Score  10-Worst pain ever    Pain Location  Leg    Pain Orientation  Right;Left    Pain Descriptors / Indicators  Aching;Sore;Throbbing    Pain Type  Chronic pain    Pain Onset  More than a month ago    Pain Frequency  Constant    Aggravating Factors   bad weather, unsure    Pain Relieving Factors  NA    Effect of Pain on Daily Activities  pushes through the pain;                 TREATMENT:  -Warm[employer] Octane fitness x 5 mins L 2 (unbilled) Utilized rolling stick to each LE thigh x1 min to help reduce soreness with minimal improvement;   NMRE:            Patient instructed in advanced balance exercise  Standing in front of steps:  Standing on airex foam: -alternate toe taps to 4 inch step with 1-0 rail assist x15 reps bilaterally; -Standing one foot on airex, one foot on 4 inch step, unsupported head turns side/side x5 reps each foot on step -Heel/toe raises x15 reps with 1-0 rail assist for balance -feet apart:  Hands on hips, forward/backward trunk lean x5 reps  Arms across chest trunk rotation x5 reps  Progressed to feet together:  Hands on hips, forward/backward trunk lean x5 reps  Arms across chest trunk rotation x5 reps  -modified tandem stance: alternate UE lift x5 reps each foot in front  Patient required min VCs for balance stability, including to increase trunk control for less loss of balance with smaller base of support Requires CGA to min A for safety;  Gait in hallway: Forward walking without walking stick, slow/fast speed changes with min A for safety; Patient has significant difficulty adjusting walking speed and exhibited 2 episodes of knee buckling when walking faster;  Had patient walk with walking stick x200 feet with head turns side/side, calling out cards on wall to challenge dynamic balance. Patient continues to require CGA to min A for safety exhibiting more imbalance with head turns compared to looking forward;  Neuro Re-ed: Patient requires CGA-min A during all standing interventions due to limited stability and patient fear of LOB. Cueing for reduction of UE support required as well as postural alignment for optimal stability within COM  Patient demonstratesexcellentmotivation throughout today's session. Patient was able to perform static and dynamic balance intervention, requiredoccasional UE support.  Patient continues to be challenged byinstability of gait and balance on narrow BOS and compliant surfaces, which improved withrepetition and min VC for sequencing.Patient would continue to benefit from skilled PT to address the deficits outlined in this note.  He continues to have increased soreness in BLE; recommend pt follow up with MD regarding continued LE pain;                   PT Education - 05/31/19 0939    Education Details  balance, HEP    Person(s) Educated  Patient    Methods  Explanation;Verbal cues    Comprehension  Verbalized understanding;Returned demonstration;Verbal cues required;Need further instruction       PT Short Term Goals - 04/18/19 1118      PT SHORT TERM GOAL #1   Title  Patient will be independent in home exercise program to improve strength/mobility for better functional independence with ADLs.    Baseline  Patient partially independent, needs further updating for his HEP.    Time  4    Period  Weeks    Status  On-going    Target Date  05/03/19      PT SHORT TERM GOAL #2   Title  Patient (< 78 years old) will complete five times sit to stand test in < 10 seconds indicating an increased LE strength and improved balance.    Baseline  04/18/2019: 22secs    Time  4    Period  Weeks    Status  New    Target Date  05/03/19        PT Long Term Goals - 04/25/19 0941      PT LONG TERM GOAL #1   Title  Patient (> 33 years old) will complete five times sit to stand test in < 15 seconds indicating an increased LE strength and improved balance.    Baseline  04/25/19=44 .07sec    Time  8    Period  Weeks    Status  Partially Met    Target Date  06/28/19      PT LONG TERM GOAL #2   Title  Patient will increase Berg Balance score by > 6 points to demonstrate decreased fall risk during functional activities.    Baseline  04/25/19=49/56    Time  8    Period  Weeks    Status  Partially Met    Target Date  06/28/19      PT LONG TERM GOAL  #3   Title  Patient will increase 10 meter walk test to >1.77ms as to improve gait speed for better community ambulation and to reduce fall risk.    Baseline  04/25/19=.71 m/sec    Time  8    Period  Weeks    Status  Partially Met    Target Date  06/28/19            Plan - 05/31/19 1039    Clinical Impression Statement  Patient tolerated session fair. He is exhibiting slight improvement in static standing balance being able to hold static position on compliant surface with less rail assist and less unsteadiness. However patient continues to have high levels of pain in BLE and exhibited occasional knee buckling with walking requiring min A to avoid fall when walking without walking stick. Recommend patient follow up with MD regarding continued LE pain. He would benefit from additional skilled PT Intervention to improve strength, balance and safety; He does report less fall history since starting therapy;    Personal Factors and Comorbidities  Age;Comorbidity 1    Comorbidities  COPD,    Examination-Participation Restrictions  Yard Work;Cleaning;Community Activity    Stability/Clinical Decision Making  Evolving/Moderate complexity    Rehab Potential  Fair    PT Frequency  2x / week    PT Duration  8 weeks    PT Treatment/Interventions  Gait training;Therapeutic activities;Balance training;Therapeutic exercise;Patient/family education    PT Next Visit Plan  strengthening and balance    PT Home Exercise Plan  heel raises, hip  abd, hip ext YTB    Consulted and Agree with Plan of Care  Patient       Patient will benefit from skilled therapeutic intervention in order to improve the following deficits and impairments:  Abnormal gait, Decreased balance, Decreased mobility, Difficulty walking, Cardiopulmonary status limiting activity, Decreased activity tolerance, Decreased strength, Pain  Visit Diagnosis: Difficulty in walking, not elsewhere classified  Muscle weakness  (generalized)     Problem List Patient Active Problem List   Diagnosis Date Noted  . Elevated glucose 05/13/2019  . Dehydration 03/14/2019  . Gross hematuria 05/12/2018  . Sick sinus syndrome ([place]) 07/08/2016  . Goals of care, counseling/discussion 07/08/2016  . 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 Chronic fatigue   . 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 PAD (peripheral artery disease) ([place])   . OSA on CPAP   . TIA (transient ischemic attack) 11/02/2015  . Chronic diastolic CHF (congestive heart failure) ([place]) 09/20/2015  . CLL (chronic lymphocytic leukemia) ([place])   . PTSD (post-traumatic stress disorder)   . Osteoarthritis of both knees 07/05/2015  . COPD (chronic obstructive pulmonary disease) ([place]) 01/01/2012  . Shortness of breath 10/09/2011  . Paroxysmal atrial fibrillation ([place]) 10/09/2011  . Hyperlipidemia 11/28/2009  . Essential hypertension 11/28/2009    Mailani Degroote PT, DPT 05/31/2019, 10:41 AM  [place] MAIN [facility] SERVICES [address] [place], [place], [address] Phone: [phone]   Fax:  [phone]  Name: ALMIN LIVINGSTONE MRN: [id number] Date of Birth: 10-19-1945

## 2019-06-01 ENCOUNTER — Encounter: Payer: Self-pay | Admitting: Internal Medicine

## 2019-06-01 ENCOUNTER — Other Ambulatory Visit: Payer: Self-pay

## 2019-06-01 NOTE — Progress Notes (Signed)
Patient pre screened for office appointment, no questions or concerns today. Patient reminded of upcoming appointment time and date. 

## 2019-06-02 ENCOUNTER — Ambulatory Visit: Payer: Medicare HMO | Admitting: Physical Therapy

## 2019-06-02 ENCOUNTER — Inpatient Hospital Stay: Payer: Medicare HMO | Attending: Internal Medicine

## 2019-06-02 ENCOUNTER — Inpatient Hospital Stay (HOSPITAL_BASED_OUTPATIENT_CLINIC_OR_DEPARTMENT_OTHER): Payer: Medicare HMO | Admitting: Internal Medicine

## 2019-06-02 ENCOUNTER — Other Ambulatory Visit: Payer: Self-pay | Admitting: Internal Medicine

## 2019-06-02 ENCOUNTER — Encounter: Payer: Self-pay | Admitting: Internal Medicine

## 2019-06-02 ENCOUNTER — Inpatient Hospital Stay: Payer: Medicare HMO

## 2019-06-02 ENCOUNTER — Other Ambulatory Visit: Payer: Self-pay

## 2019-06-02 VITALS — BP 150/67 | HR 59 | Temp 98.5°F | Resp 17

## 2019-06-02 DIAGNOSIS — C9110 Chronic lymphocytic leukemia of B-cell type not having achieved remission: Secondary | ICD-10-CM | POA: Diagnosis not present

## 2019-06-02 DIAGNOSIS — Z5112 Encounter for antineoplastic immunotherapy: Secondary | ICD-10-CM | POA: Diagnosis not present

## 2019-06-02 DIAGNOSIS — Z7189 Other specified counseling: Secondary | ICD-10-CM

## 2019-06-02 LAB — CBC WITH DIFFERENTIAL/PLATELET
Abs Immature Granulocytes: 0.1 10*3/uL — ABNORMAL HIGH (ref 0.00–0.07)
Basophils Absolute: 0 10*3/uL (ref 0.0–0.1)
Basophils Relative: 0 %
Eosinophils Absolute: 0 10*3/uL (ref 0.0–0.5)
Eosinophils Relative: 0 %
HCT: 37.3 % — ABNORMAL LOW (ref 39.0–52.0)
Hemoglobin: 12.3 g/dL — ABNORMAL LOW (ref 13.0–17.0)
Immature Granulocytes: 2 %
Lymphocytes Relative: 34 %
Lymphs Abs: 2.2 10*3/uL (ref 0.7–4.0)
MCH: 32.1 pg (ref 26.0–34.0)
MCHC: 33 g/dL (ref 30.0–36.0)
MCV: 97.4 fL (ref 80.0–100.0)
Monocytes Absolute: 0.9 10*3/uL (ref 0.1–1.0)
Monocytes Relative: 14 %
Neutro Abs: 3.2 10*3/uL (ref 1.7–7.7)
Neutrophils Relative %: 50 %
Platelets: 130 10*3/uL — ABNORMAL LOW (ref 150–400)
RBC: 3.83 MIL/uL — ABNORMAL LOW (ref 4.22–5.81)
RDW: 15.1 % (ref 11.5–15.5)
WBC: 6.5 10*3/uL (ref 4.0–10.5)
nRBC: 0 % (ref 0.0–0.2)

## 2019-06-02 LAB — COMPREHENSIVE METABOLIC PANEL
ALT: 27 U/L (ref 0–44)
AST: 26 U/L (ref 15–41)
Albumin: 4 g/dL (ref 3.5–5.0)
Alkaline Phosphatase: 85 U/L (ref 38–126)
Anion gap: 5 (ref 5–15)
BUN: 15 mg/dL (ref 8–23)
CO2: 23 mmol/L (ref 22–32)
Calcium: 9 mg/dL (ref 8.9–10.3)
Chloride: 110 mmol/L (ref 98–111)
Creatinine, Ser: 0.87 mg/dL (ref 0.61–1.24)
GFR calc Af Amer: >60 mL/min (ref >60)
GFR calc non Af Amer: >60 mL/min (ref >60)
Glucose, Bld: 128 mg/dL — ABNORMAL HIGH (ref 70–99)
Potassium: 4 mmol/L (ref 3.5–5.1)
Sodium: 138 mmol/L (ref 135–145)
Total Bilirubin: 1.1 mg/dL (ref 0.3–1.2)
Total Protein: 6.4 g/dL — ABNORMAL LOW (ref 6.5–8.1)

## 2019-06-02 LAB — PHOSPHORUS: Phosphorus: 4.2 mg/dL (ref 2.5–4.6)

## 2019-06-02 LAB — LACTATE DEHYDROGENASE: LDH: 158 U/L (ref 98–192)

## 2019-06-02 LAB — URIC ACID: Uric Acid, Serum: 2.7 mg/dL — ABNORMAL LOW (ref 3.7–8.6)

## 2019-06-02 LAB — MAGNESIUM: Magnesium: 2 mg/dL (ref 1.7–2.4)

## 2019-06-02 MED ORDER — SODIUM CHLORIDE 0.9 % IV SOLN
1000.0000 mg | Freq: Once | INTRAVENOUS | Status: AC
  Administered 2019-06-02: 1000 mg via INTRAVENOUS
  Filled 2019-06-02: qty 40

## 2019-06-02 MED ORDER — SODIUM CHLORIDE 0.9 % IV SOLN
Freq: Once | INTRAVENOUS | Status: AC
  Administered 2019-06-02: 09:00:00 via INTRAVENOUS
  Filled 2019-06-02: qty 250

## 2019-06-02 MED ORDER — METHYLPREDNISOLONE SODIUM SUCC 125 MG IJ SOLR
50.0000 mg | Freq: Once | INTRAMUSCULAR | Status: AC
  Administered 2019-06-02: 50 mg via INTRAVENOUS

## 2019-06-02 MED ORDER — DIPHENHYDRAMINE HCL 50 MG/ML IJ SOLN
25.0000 mg | Freq: Once | INTRAMUSCULAR | Status: AC | PRN
  Administered 2019-06-02: 25 mg via INTRAVENOUS

## 2019-06-02 MED ORDER — SODIUM CHLORIDE 0.9 % IV SOLN
Freq: Once | INTRAVENOUS | Status: AC | PRN
  Administered 2019-06-02: 11:00:00 via INTRAVENOUS
  Filled 2019-06-02: qty 250

## 2019-06-02 MED ORDER — ACETAMINOPHEN 325 MG PO TABS
650.0000 mg | ORAL_TABLET | Freq: Once | ORAL | Status: AC
  Administered 2019-06-02: 650 mg via ORAL
  Filled 2019-06-02: qty 2

## 2019-06-02 MED ORDER — DIPHENHYDRAMINE HCL 50 MG/ML IJ SOLN
50.0000 mg | Freq: Once | INTRAMUSCULAR | Status: AC
  Administered 2019-06-02: 50 mg via INTRAVENOUS
  Filled 2019-06-02: qty 1

## 2019-06-02 MED ORDER — HEPARIN SOD (PORK) LOCK FLUSH 100 UNIT/ML IV SOLN
500.0000 [IU] | Freq: Once | INTRAVENOUS | Status: DC | PRN
  Filled 2019-06-02: qty 5

## 2019-06-02 MED ORDER — SODIUM CHLORIDE 0.9 % IV SOLN
20.0000 mg | Freq: Once | INTRAVENOUS | Status: AC
  Administered 2019-06-02: 20 mg via INTRAVENOUS
  Filled 2019-06-02: qty 2

## 2019-06-02 NOTE — Assessment & Plan Note (Addendum)
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 Currently s/p 5 cycles of Gazyva. STABLE.   # proceed with cycle #6 of Gazyva today; Labs today reviewed;  acceptable for treatment today.continue white count is 6.5 hemoglobin 12  platelets- 130.; continue venatoclax-  200 mg/day. Will order CT scans at next visit.   #Worsening fatigue/muscle pain in legs-question etiology.appt with cards re: ? PVD; awaiting to speak Dr.Golan.   # A.fib-on amiodarone sinus rhythm-on Eliquis- stable.   # TLS/infectious Prophylaxis-allopurinol; Bactrim/acyclovir. STABLE.    DISPOSITION:  # proceed with Gazyva # follow up in 2 month;  MD- labs- cbc/cmp/ldh/mag phos/uric acid;Gazyva-  Dr.B

## 2019-06-02 NOTE — Progress Notes (Signed)
[name] OFFICE PROGRESS NOTE  Patient Care Team: Leone Haven, MD as PCP - General (Family Medicine) [name], MD as Consulting Physician (Cardiology) Cammie Sickle, MD as Medical Oncologist (Hematology and Oncology)  Cancer Staging No matching staging information was found for the patient.   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  MARCH 2017- CT N/C/A/P- NED. STOP Ritux; surveillance   # AUG 2019- CT/PET- progression/NO transformation;  # NOV 2019- Progression; started Ibrutinib 420 mg/d. STOPPED in end of feb sec to extreme fatigue/joint pains/cramps  #November 01, 2018-start ibrutinib 280 mg a day; December 20, 2018-discontinue ibrutinib secondary multiple side effects.   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      INTERVAL HISTORY:  Michael DANESE89y.[id number].  male CLL-high risk currently on GDyann Kiefis here for follow-up; currently status post 5 cycles is here for follow-up.  Patient currently on venetoclax  taking  200 mg a day [drug interaction with amiodarone].  Patient continues to have cramping in the legs.  He is is concerned more about peripheral vascular disease.  Continues with joint pains which are fairly steady-since starting physical therapy.  No falls.  No lumps or bumps.  No chills.   Review of Systems  Constitutional: Positive for malaise/fatigue. Negative for chills, diaphoresis and fever.  HENT: Negative for nosebleeds and sore throat.   Eyes: Negative for double vision.  Respiratory: Negative for cough, hemoptysis, sputum production, shortness of breath and wheezing.   Cardiovascular: Negative for chest pain, palpitations, orthopnea and leg swelling.  Gastrointestinal: Negative for abdominal pain, blood in stool, constipation, diarrhea, heartburn, melena, nausea and vomiting.  Genitourinary: Negative for dysuria, frequency and urgency.  Musculoskeletal: Positive for back pain and joint pain.  Skin: Negative.  Negative for itching and rash.  Neurological: Negative for dizziness, tingling, focal weakness and headaches.  Psychiatric/Behavioral: Negative for depression. The patient is not nervous/anxious and does not have insomnia.       PAST MEDICAL HISTORY :  Past Medical History:  Diagnosis Date  . Anxiety   . Arthritis   . Atrial fibrillation ([place]    a. Dx 2013, recurred 02/2014, CHA2DS2VASc = 3 -->placed on Eliquis;  b. 02/2014 Echo: EF 50-55%, mid and apical anterior septum and mid and apical inf septum are abnl, mild to mod Ao sclerosis w/o AS.  .Marland KitchenChicken pox   . Chronic lymphocytic leukemia ([place]    a. Dx 02/2014.  .Marland KitchenComplication of anesthesia    History of  PTSD--do not touch patient when waking up from surgery.  .Marland KitchenCOPD (  chronic obstructive pulmonary disease) ([place])   . Coronary artery disease    a. 04/2009 CABG x 3 (LIMA->LAD, VG->OM1, VG->PDA);  b. 09/2009 Cath: occluded VG x 2 w/ patent LIMA and L->R collats. EF 55%, mild antlat HK;  c. 10/2011 MV: EF 53%, no  isch/infarct-->low risk.  Marland Kitchen Dysrhythmia    hx of a-fib  . GERD (gastroesophageal reflux disease)    occasional  . History of chemotherapy 2015-2016  . HOH (hard of hearing)    Bilateral Hearing Aids  . Hypertension   . Myocardial infarction ([place]) 2010  . OSA on CPAP    USE C-PAP  . Presence of permanent cardiac pacemaker 2017  . PTSD (post-traumatic stress disorder)   . PTSD (post-traumatic stress disorder)   . Pure hypercholesterolemia   . Rheumatic fever 1959  . 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 ABDOMINAL HERNIA REPAIR    . APPENDECTOMY  06/21/1985  . CARDIAC CATHETERIZATION  2010; 2011   ; Dr Fletcher Anon  . CORONARY ARTERY BYPASS GRAFT  04/2009   "CABG X3"  . EP IMPLANTABLE DEVICE N/A 03/03/2016   Procedure: Pacemaker Implant;  Surgeon: Deboraha Sprang, MD;  Location: [facility];  Service: Cardiovascular;  Laterality: N/A;  . FOREIGN BODY REMOVAL  1968   "shrapnel in my tailbone"  . INGUINAL HERNIA REPAIR Right   . INSERT / REPLACE / REMOVE PACEMAKER    . JOINT REPLACEMENT Right 2018  . LAPAROSCOPIC CHOLECYSTECTOMY    . TONSILLECTOMY AND ADENOIDECTOMY  1956  . TOTAL HIP ARTHROPLASTY Right 10/22/2016   Procedure: TOTAL HIP ARTHROPLASTY;  Surgeon: Dereck Leep, MD;  Location: [facility];  Service: Orthopedics;  Laterality: Right;  . TOTAL HIP ARTHROPLASTY Left 11/04/2017   Procedure: TOTAL HIP ARTHROPLASTY;  Surgeon: Dereck Leep, MD;  Location: [facility];  Service: Orthopedics;  Laterality: Left;    FAMILY HISTORY :   Family History  Problem Relation Age of Onset  . Heart disease Mother   . Heart attack Mother   . Coronary artery disease Other        family history    SOCIAL HISTORY:   Social History   Tobacco Use  . Smoking status: Former Smoker    Packs/day: 1.00    Years: 40.00    Pack years: 40.00    Types: Cigarettes    Quit date: 07/21/2006    Years  since quitting: 12.8  . Smokeless tobacco: Never Used  Substance Use Topics  . Alcohol use: Not Currently  . Drug use: No    ALLERGIES:  has No Known Allergies.  MEDICATIONS:  Current Outpatient Medications  Medication Sig Dispense Refill  . acetaminophen (TYLENOL) 500 MG tablet Take 1,000 mg by mouth every 8 (eight) hours as needed for mild pain.    Marland Kitchen acyclovir (ZOVIRAX) 400 MG tablet TAKE 1 TABLET BY MOUTH ONCE DAILY TO PREVENT SHINGLES 30 tablet 0  . albuterol (PROVENTIL HFA;VENTOLIN HFA) 108 (90 Base) MCG/ACT inhaler Inhale 2 puffs into the lungs every 6 (six) hours as needed for wheezing or shortness of breath. 1 Inhaler 11  . allopurinol (ZYLOPRIM) 300 MG tablet Take 1 tablet by mouth twice daily 120 tablet 3  . amiodarone (PACERONE) 200 MG tablet TAKE 1/2 TABLET (100 mg)  BY MOUTH TWICE DAILY 90 tablet 2  . apixaban (ELIQUIS) 5 MG TABS tablet Take 5 mg  by mouth 2 (two) times daily.    Marland Kitchen atorvastatin (LIPITOR) 80 MG tablet Take 1 tablet (80 mg total) by mouth at bedtime. 90 tablet 3  . budesonide-formoterol (SYMBICORT) 160-4.5 MCG/ACT inhaler Inhale 2 puffs into the lungs 2 (two) times daily. 1 Inhaler 11  . cetirizine (ZYRTEC) 10 MG tablet Take 10 mg by mouth daily as needed for allergies.     . Coenzyme Q10 (COQ10) 200 MG CAPS Take 200 mg by mouth daily.    Marland Kitchen dexamethasone (DECADRON) 4 MG tablet Start 2 days prior to infusion; Take for 2 days. Do not take on the day of infusion. 60 tablet 3  . ezetimibe (ZETIA) 10 MG tablet Take 1 tablet (10 mg total) by mouth daily. 90 tablet 3  . furosemide (LASIX) 20 MG tablet Take 1 tablet (20 mg) by mouth twice daily as needed    . Krill Oil 350 MG CAPS Take 350 mg by mouth every evening.    . metoprolol tartrate (LOPRESSOR) 25 MG tablet Take 25 mg by mouth 2 (two) times daily.    . mirtazapine (REMERON) 15 MG tablet Take 15 mg by mouth at bedtime as needed (for panic associated with PTSD).     Marland Kitchen montelukast (SINGULAIR) 10 MG tablet Take 1  tablet (10 mg total) by mouth at bedtime. Start 2 days prior to infusion. Take it for 4 days. 60 tablet 0  . Multiple Vitamin (MULTIVITAMIN WITH MINERALS) TABS tablet Take 1 tablet by mouth daily.    . mupirocin ointment (BACTROBAN) 2 % Apply 1 application topically 3 (three) times daily. 30 g 1  . nitroGLYCERIN (NITROSTAT) 0.4 MG SL tablet Place 1 tablet (0.4 mg total) under the tongue every 5 (five) minutes as needed for chest pain. 25 tablet 6  . tiotropium (SPIRIVA) 18 MCG inhalation capsule Place 1 capsule (18 mcg total) into inhaler and inhale at bedtime. 30 capsule 11  . traMADol (ULTRAM) 50 MG tablet TAKE 1 TABLET BY MOUTH EVERY 12 HOURS AS NEEDED FOR MODERATE PAIN 90 tablet 0  . venetoclax (VENCLEXTA) 100 MG TABS Take 200 mg by mouth daily. (Take 2 tablets daily) 60 tablet 6   No current facility-administered medications for this visit.     PHYSICAL EXAMINATION: ECOG PERFORMANCE STATUS: 1 - Symptomatic but completely ambulatory  BP (!) 142/87 (BP Location: Left Arm, Patient Position: Sitting, Cuff Size: Large)   Pulse 80   Temp 98.5 F (36.9 C) (Oral)   Wt 229 lb (103.9 kg)   BMI 33.82 kg/m   Filed Weights   06/02/19 0830  Weight: 229 lb (103.9 kg)    Physical Exam  Constitutional: He is oriented to person, place, and time and well-developed, well-nourished, and in no distress.  Patient is alone.  HENT:  Head: Normocephalic and atraumatic.  Mouth/Throat: Oropharynx is clear and moist. No oropharyngeal exudate.  Eyes: Pupils are equal, round, and reactive to light.  Neck: Normal range of motion. Neck supple.  Cardiovascular: Normal rate and regular rhythm.  Pulmonary/Chest: No respiratory distress. He has no wheezes.  Abdominal: Soft. Bowel sounds are normal. He exhibits no distension and no mass. There is no abdominal tenderness. There is no rebound and no guarding.  Musculoskeletal: Normal range of motion.        General: No tenderness or edema.  Lymphadenopathy:   Resolved lymph nodes in the neck underarms.  Neurological: He is alert and oriented to person, place, and time.  Skin: Skin is warm.  Psychiatric: Affect  normal.    LABORATORY DATA:  I have reviewed the data as listed    Component Value Date/Time   NA 141 05/05/2019 0800   NA 139 10/11/2014 1800   K 3.8 05/05/2019 0800   K 3.3 (L) 10/11/2014 1800   CL 110 05/05/2019 0800   CL 106 10/11/2014 1800   CO2 22 05/05/2019 0800   CO2 27 10/11/2014 1800   GLUCOSE 180 (H) 05/05/2019 0800   GLUCOSE 107 (H) 10/11/2014 1800   BUN 17 05/05/2019 0800   BUN 15 10/11/2014 1800   CREATININE 0.92 05/05/2019 0800   CREATININE 0.89 10/11/2014 1800   CALCIUM 9.0 05/05/2019 0800   CALCIUM 8.8 (L) 10/11/2014 1800   PROT 6.5 05/05/2019 0800   PROT 6.7 05/18/2017 1048   PROT 6.4 (L) 10/11/2014 1800   ALBUMIN 4.0 05/05/2019 0800   ALBUMIN 4.3 05/18/2017 1048   ALBUMIN 4.1 10/11/2014 1800   AST 26 05/05/2019 0800   AST 23 10/11/2014 1800   ALT 36 05/05/2019 0800   ALT 22 10/11/2014 1800   ALKPHOS 91 05/05/2019 0800   ALKPHOS 61 10/11/2014 1800   BILITOT 0.9 05/05/2019 0800   BILITOT 0.7 05/18/2017 1048   BILITOT 0.9 10/11/2014 1800   GFRNONAA >60 05/05/2019 0800   GFRNONAA >60 10/11/2014 1800   GFRAA >60 05/05/2019 0800   GFRAA >60 10/11/2014 1800    No results found for: SPEP, UPEP  Lab Results  Component Value Date   WBC 6.5 06/02/2019   NEUTROABS 3.2 06/02/2019   HGB 12.3 (L) 06/02/2019   HCT 37.3 (L) 06/02/2019   MCV 97.4 06/02/2019   PLT 130 (L) 06/02/2019      Chemistry      Component Value Date/Time   NA 141 05/05/2019 0800   NA 139 10/11/2014 1800   K 3.8 05/05/2019 0800   K 3.3 (L) 10/11/2014 1800   CL 110 05/05/2019 0800   CL 106 10/11/2014 1800   CO2 22 05/05/2019 0800   CO2 27 10/11/2014 1800   BUN 17 05/05/2019 0800   BUN 15 10/11/2014 1800   CREATININE 0.92 05/05/2019 0800   CREATININE 0.89 10/11/2014 1800      Component Value Date/Time   CALCIUM 9.0  05/05/2019 0800   CALCIUM 8.8 (L) 10/11/2014 1800   ALKPHOS 91 05/05/2019 0800   ALKPHOS 61 10/11/2014 1800   AST 26 05/05/2019 0800   AST 23 10/11/2014 1800   ALT 36 05/05/2019 0800   ALT 22 10/11/2014 1800   BILITOT 0.9 05/05/2019 0800   BILITOT 0.7 05/18/2017 1048   BILITOT 0.9 10/11/2014 1800       RADIOGRAPHIC STUDIES: I have personally reviewed the radiological images as listed and agreed with the findings in the report. No results found.   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 Currently s/p 5 cycles of Gazyva. STABLE.   # proceed with cycle #6 of Gazyva today; Labs today reviewed;  acceptable for treatment today.continue white count is 6.5 hemoglobin 12  platelets- 130.; continue venatoclax-  200 mg/day. Will order CT scans at next visit.   #Worsening fatigue/muscle pain in legs-question etiology.appt with cards re: ? PVD; awaiting to speak Dr.Golan.   # A.fib-on amiodarone sinus rhythm-on Eliquis- stable.   # TLS/infectious Prophylaxis-allopurinol; Bactrim/acyclovir. STABLE.    DISPOSITION:  # proceed with Gazyva # follow up in 2 month;  MD- labs- cbc/cmp/ldh/mag phos/uric acid;Gazyva-  Dr.B     Orders Placed This Encounter  Procedures  . CBC with Differential    Standing Status:   Future    Standing Expiration Date:   06/01/2020  . Comprehensive metabolic panel    Standing Status:   Future    Standing Expiration Date:   06/01/2020  . Lactate dehydrogenase    Standing Status:   Future    Standing Expiration Date:   06/01/2020  . Magnesium    Standing Status:   Future    Standing Expiration Date:   06/01/2020  . Phosphorus    Standing Status:   Future    Standing Expiration Date:   06/01/2020  . Uric acid    Standing Status:   Future    Standing Expiration Date:   06/01/2020   All questions were answered. The  patient knows to call the clinic with any problems, questions or concerns.      Cammie Sickle, MD 06/02/2019 8:48 AM

## 2019-06-02 NOTE — Progress Notes (Signed)
1104: Pt reports "itching all over" pt states "this blanket is making me itch, it started right after I got this blanket".  Pt denies any other symptoms at this time, no rash noted, no swelling of the lips or tongue noted, pt denies difficulty breathing. MD aware. 1105: Pt reports itching is now in his face, Gazyva stopped,NS started to gravity.Per Dr. Rogue Bussing give 25mg  IV Benadryl and 50 MG IV solumedrol.  Medications given per order, ( see MAR), VS remain stable (see flow sheets).  1115: Sonia Baller NP at chairside, per Sonia Baller if symptoms resolve, after 30 minutes of NS bolus, and VS stable may restart Gazyva at the 200mg /hr rate and increase by 50mg /hr every 30 minutes until max rate of 400mg /hr (given as a first time infusion.).  1125: Pt reports itching has completely resolved, pt continues to deny any other symptoms, no other symptoms noted.  1151: VSS remain stable, Gazyva Restarted as directed. (see MAR).  1455: Pt tolerated remainder of infusion. Pt denies any complaints at this time, no s/s of distress noted, VSS. Pt states he can drive home, pt declines calling for a ride. Per Dr. Rogue Bussing okay to discharge pt home. Pt educated to call clinic with any concerns, or report to ER/call 911 in the case of an emergency.  1500: Pt and VSS at discharge.

## 2019-06-04 NOTE — Progress Notes (Signed)
. Cardiology Office Note  Date:  06/06/2019   ID:  [name], DOB 04-Jul-1946, MRN [id number]  PCP:  Michael Haven, MD   Chief Complaint  Patient presents with  . Other    12 month f/u c/o leg pain feels muscular. Meds reviewed verbally with pt.    HPI:  [name] is a 73 year old gentleman with a history of  smoking, coronary artery disease,  cabg,  catheterization March 2011 with occluded vein graft 2, patent LIMA, collaterals from left to right,  atrial fibrillation March 2013, 10/11/2014. Converted to normal with metoprolol,  Possible conversion pause. 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  Overall feels well except for having severe pain in his legs Reports long history of bad muscle pain in thighs, No pain at rest, most of the pain standing up Previously tried to stop lipitor, did not help with his leg pain Describes the pain as a "grab and does not let go" No better with dexamethasone constant day and night,   Completed PT for 3 months with no relief Does not feel that his balance is much better  BP elevated today [id number] systolic At home [id number]  No palpitations concerning for atrial fib Chronic SOB walking up from barn, albuterol helps  finished cancer med infusion Plan for CT scan  Off eliquis, hematuria "a lot" WBC 252  Sleeps  5 hours, worse with legs Poor balance even after PT  Has PTSD, bad dreams He has seen psychiatry in the past which has helped  No regular exercise program, works on his farm, goats No chest pain concerning for angina  EKG personally reviewed by myself on todays visit Shows paced rhythm 63 bpm  Other past medical history reviewed Left total hip on the right 10/22/16  fell off a ladder beginning of October 2017 [facility]: 04/25/2016  showing large hematoma Right gluteal intramuscular hematoma  measuring 9.6 x 6.8 x 4.9 cm [facility] stopped eliquis, statin (unclear why they stopped his statin)  Working with the [facility] for PTSD, reports feeling well, stable Reports that he is been compliant with his Lasix, taking 80 mg daily which is higher dose than before for the past 5 days, no improvement in his leg swelling  atrial fibrillation/flutter starting September 12 2015, then persistent flutter In the clinic his heart rate was 126 bpm Amiodarone dosing was increased, amlodipine was changed to diltiazem 120 mg daily, metoprolol increased up to 25 mill grams twice a day Converted to normal sinus rhythm at home  tachycardia concerning for atrial fibrillation 05/10/2015. Lasted only several minutes Rate possibly up to 140 bpm.  Previously has been very active, building a barn, tool shed, taking care of animals some benefits from the [place] for agent orange exposure and his cancer  admitted to the hospital on September 26 2011 for malaise, appearing pale, irregular heart rhythm and noted to be in atrial fibrillation. He was given medication for rhythm control and he converted to normal sinus rhythm later that evening. No longer on anticoagulation. He has been maintaining normal sinus rhythm No symptoms concerning for atrial fibrillation since that time  Echocardiogram showed ejection fraction 35-45%, mildly elevated right ventricular systolic pressures estimated at 30-40 mm mercury.  Previous catheterization showing occluded LAD in the mid vessel, 60% left main, 99% distal RCA who  was sent to [facility] in October 2010 for bypass surgery. He received 3 vessel bypass by Dr.Gerhardt with a LIMA to the LAD, vein graft to the OM1 and vein graft to the PDA, with subsequent cardiac catheter March 2011 for chest discomfort showing occluded vein grafts x2 with patent LIMA. He has collateral vessels from left to right. Ejection fraction 55%  mild anterolateral hypokinesis.  PMH:   has a past medical history of Anxiety, Arthritis, Atrial fibrillation ([place]), Chicken pox, Chronic lymphocytic leukemia ([place]), Complication of anesthesia, COPD (chronic obstructive pulmonary disease) ([place]), Coronary artery disease, Dysrhythmia, GERD (gastroesophageal reflux disease), History of chemotherapy (2015-2016), HOH (hard of hearing), Hypertension, Myocardial infarction ([place]) (2010), OSA on CPAP, Presence of permanent cardiac pacemaker (2017), PTSD (post-traumatic stress disorder), PTSD (post-traumatic stress disorder), Pure hypercholesterolemia, Rheumatic fever (1959), and 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  PSH:    Past Surgical History:  Procedure Laterality Date  . ABDOMINAL HERNIA REPAIR    . APPENDECTOMY  06/21/1985  . CARDIAC CATHETERIZATION  2010; 2011   ; Dr Fletcher Anon  . CORONARY ARTERY BYPASS GRAFT  04/2009   "CABG X3"  . EP IMPLANTABLE DEVICE N/A 03/03/2016   Procedure: Pacemaker Implant;  Surgeon: Deboraha Sprang, MD;  Location: [facility];  Service: Cardiovascular;  Laterality: N/A;  . FOREIGN BODY REMOVAL  1968   "shrapnel in my tailbone"  . INGUINAL HERNIA REPAIR Right   . INSERT / REPLACE / REMOVE PACEMAKER    . JOINT REPLACEMENT Right 2018  . LAPAROSCOPIC CHOLECYSTECTOMY    . TONSILLECTOMY AND ADENOIDECTOMY  1956  . TOTAL HIP ARTHROPLASTY Right 10/22/2016   Procedure: TOTAL HIP ARTHROPLASTY;  Surgeon: Dereck Leep, MD;  Location: [facility];  Service: Orthopedics;  Laterality: Right;  . TOTAL HIP ARTHROPLASTY Left 11/04/2017   Procedure: TOTAL HIP ARTHROPLASTY;  Surgeon: Dereck Leep, MD;  Location: [facility];  Service: Orthopedics;  Laterality: Left;    Current Outpatient Medications  Medication Sig Dispense Refill  . acetaminophen (TYLENOL) 500 MG tablet Take 1,000 mg by mouth every 8 (eight) hours as needed for mild pain.    Marland Kitchen acyclovir (ZOVIRAX) 400 MG tablet TAKE 1 TABLET BY MOUTH ONCE DAILY TO  PREVENT SHINGLES 30 tablet 0  . albuterol (PROVENTIL HFA;VENTOLIN HFA) 108 (90 Base) MCG/ACT inhaler Inhale 2 puffs into the lungs every 6 (six) hours as needed for wheezing or shortness of breath. 1 Inhaler 11  . allopurinol (ZYLOPRIM) 300 MG tablet Take 1 tablet by mouth twice daily 120 tablet 3  . amiodarone (PACERONE) 200 MG tablet TAKE 1/2 TABLET (100 mg)  BY MOUTH TWICE DAILY 90 tablet 2  . apixaban (ELIQUIS) 5 MG TABS tablet Take 5 mg by mouth 2 (two) times daily.    Marland Kitchen atorvastatin (LIPITOR) 80 MG tablet Take 1 tablet (80 mg total) by mouth at bedtime. 90 tablet 3  . budesonide-formoterol (SYMBICORT) 160-4.5 MCG/ACT inhaler Inhale 2 puffs into the lungs 2 (two) times daily. 1 Inhaler 11  . cetirizine (ZYRTEC) 10 MG tablet Take 10 mg by mouth daily as needed for allergies.     . Coenzyme Q10 (COQ10) 200 MG CAPS Take 200 mg by mouth daily.    Marland Kitchen dexamethasone (DECADRON) 4 MG tablet Start 2 days prior to infusion; Take for 2 days. Do not take on the day of infusion. 60 tablet 3  . ezetimibe (ZETIA) 10 MG tablet Take 1 tablet (10 mg total) by mouth daily. 90 tablet 3  .  furosemide (LASIX) 20 MG tablet Take 1 tablet (20 mg) by mouth twice daily as needed    . Krill Oil 350 MG CAPS Take 350 mg by mouth as needed.     . metoprolol tartrate (LOPRESSOR) 25 MG tablet Take 25 mg by mouth 2 (two) times daily.    . mirtazapine (REMERON) 15 MG tablet Take 15 mg by mouth at bedtime as needed (for panic associated with PTSD).     Marland Kitchen montelukast (SINGULAIR) 10 MG tablet Take 1 tablet (10 mg total) by mouth at bedtime. Start 2 days prior to infusion. Take it for 4 days. 60 tablet 0  . Multiple Vitamin (MULTIVITAMIN WITH MINERALS) TABS tablet Take 1 tablet by mouth daily.    . mupirocin ointment (BACTROBAN) 2 % Apply 1 application topically 3 (three) times daily. 30 g 1  . nitroGLYCERIN (NITROSTAT) 0.4 MG SL tablet Place 1 tablet (0.4 mg total) under the tongue every 5 (five) minutes as needed for chest pain. 25  tablet 6  . tiotropium (SPIRIVA) 18 MCG inhalation capsule Place 1 capsule (18 mcg total) into inhaler and inhale at bedtime. 30 capsule 11  . traMADol (ULTRAM) 50 MG tablet TAKE 1 TABLET BY MOUTH EVERY 12 HOURS AS NEEDED FOR MODERATE PAIN 90 tablet 0  . venetoclax (VENCLEXTA) 100 MG TABS Take 200 mg by mouth daily. (Take 2 tablets daily) 60 tablet 6   No current facility-administered medications for this visit.      Allergies:   Patient has no known allergies.   Social History:  The patient  reports that he quit smoking about 12 years ago. His smoking use included cigarettes. He has a 40.00 pack-year smoking history. He has never used smokeless tobacco. He reports previous alcohol use. He reports that he does not use drugs.   Family History:   family history includes Coronary artery disease in an other family member; Heart attack in his mother; Heart disease in his mother.    Review of Systems: Review of Systems  Constitutional: Negative.   HENT: Negative.   Respiratory: Negative.   Cardiovascular: Negative.   Gastrointestinal: Negative.   Musculoskeletal: Negative.        Severe leg pain  Neurological: Negative.   Psychiatric/Behavioral: Negative.   All other systems reviewed and are negative.   PHYSICAL EXAM: VS:  BP (!) 168/90 (BP Location: Left Arm, Patient Position: Sitting, Cuff Size: Normal)   Pulse 63   Ht 5\' 9"  (1.753 m)   Wt 232 lb 8 oz (105.5 kg)   SpO2 98%   BMI 34.33 kg/m  , BMI Body mass index is 34.33 kg/m. Constitutional:  oriented to person, place, and time. No distress.  HENT:  Head: Grossly normal Eyes:  no discharge. No scleral icterus.  Neck: No JVD, no carotid bruits  Cardiovascular: Regular rate and rhythm, no murmurs appreciated Pulmonary/Chest: Clear to auscultation bilaterally, no wheezes or rails Abdominal: Soft.  no distension.  no tenderness.  Musculoskeletal: Normal range of motion Neurological:  normal muscle tone. Coordination normal.  No atrophy Skin: Skin warm and dry Psychiatric: normal affect, pleasant   Recent Labs: 03/29/2019: TSH 0.677 06/02/2019: ALT 27; BUN 15; Creatinine, Ser 0.87; Hemoglobin 12.3; Magnesium 2.0; Platelets 130; Potassium 4.0; Sodium 138    Lipid Panel Lab Results  Component Value Date   CHOL 104 11/22/2018   HDL 50 11/22/2018   LDLCALC 43 11/22/2018   TRIG 57 11/22/2018      Wt Readings from Last 3 Encounters:  06/06/19 232 lb 8 oz (105.5 kg)  06/02/19 229 lb (103.9 kg)  05/13/19 228 lb (103.4 kg)     ASSESSMENT AND PLAN: Atrial fibrillation, unspecified type ([place])  Maintaining normal sinus rhythm, continue amiodarone, metoprolol  Had hematuria in eliquis (does not want urology f/u) Does not want to restart blood thinners  HYPERCHOLESTEROLEMIA Lipitor 80 mg daily, zetia daily Goal LDL less than 70 Muscle pain no better off lipitor  Essential hypertension Well controlled at home, Elevated here, after walking long distance  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  Might try it again. Might need a long-acting benzo like Valium for muscle pain if no relief   Total encounter time more than 25 minutes  Greater than 50% was spent in counseling and coordination of care with the patient   Disposition:   F/U  6 months   Orders Placed This Encounter  Procedures  . EKG 12-Lead     Signed, Esmond Plants, M.D., Ph.D. 06/06/2019  [place], [place]

## 2019-06-06 ENCOUNTER — Ambulatory Visit: Payer: Medicare HMO | Admitting: Cardiovascular Disease

## 2019-06-06 ENCOUNTER — Other Ambulatory Visit: Payer: Self-pay

## 2019-06-06 ENCOUNTER — Encounter: Payer: Self-pay | Admitting: Cardiovascular Disease

## 2019-06-06 VITALS — BP 168/90 | HR 63 | Ht 69.0 in | Wt 232.5 lb

## 2019-06-06 DIAGNOSIS — E782 Mixed hyperlipidemia: Secondary | ICD-10-CM

## 2019-06-06 DIAGNOSIS — I10 Essential (primary) hypertension: Secondary | ICD-10-CM

## 2019-06-06 DIAGNOSIS — I5032 Chronic diastolic (congestive) heart failure: Secondary | ICD-10-CM | POA: Diagnosis not present

## 2019-06-06 DIAGNOSIS — I739 Peripheral vascular disease, unspecified: Secondary | ICD-10-CM

## 2019-06-06 DIAGNOSIS — I495 Sick sinus syndrome: Secondary | ICD-10-CM | POA: Diagnosis not present

## 2019-06-06 DIAGNOSIS — I480 Paroxysmal atrial fibrillation: Secondary | ICD-10-CM

## 2019-06-06 NOTE — Patient Instructions (Addendum)
Medication Instructions:  No changes  If you need a refill on your cardiac medications before your next appointment, please call your pharmacy.    Lab work: No new labs needed   If you have labs (blood work) drawn today and your tests are completely normal, you will receive your results only by: Marland Kitchen MyChart Message (if you have MyChart) OR . A paper copy in the mail If you have any lab test that is abnormal or we need to change your treatment, we will call you to review the results.   Testing/Procedures: Your physician has requested that you have an ankle brachial index (ABI). During this test an ultrasound and blood pressure cuff are used to evaluate the arteries that supply the arms and legs with blood. Allow thirty minutes for this exam. There are no restrictions or special instructions.  - Your physician has requested that you have a lower extremity arterial duplex. This test is an ultrasound of the arteries in the legs. It looks at arterial blood flow in the legs. Allow one hour for Lower Arterial scans. There are no restrictions or special instructions   Follow-Up: At [facility], you and your health needs are our priority.  As part of our continuing mission to provide you with exceptional heart care, we have created designated Provider Care Teams.  These Care Teams include your primary Cardiologist (physician) and Advanced Practice Providers (APPs -  Physician Assistants and Nurse Practitioners) who all work together to provide you with the care you need, when you need it.  . You will need a follow up appointment in 6 months .   Please call our office 2 months in advance to schedule this appointment.    . Providers on your designated Care Team:   . Murray Hodgkins, NP . Christell Faith, PA-C . Marrianne Mood, PA-C  Any Other Special Instructions Will Be Listed Below (If Applicable).  For educational health videos Log in to : [email or web address] Or : [email or web address], password :  [email or web address]

## 2019-06-07 ENCOUNTER — Encounter: Payer: Self-pay | Admitting: Physical Therapy

## 2019-06-07 ENCOUNTER — Ambulatory Visit: Payer: Medicare HMO | Admitting: Physical Therapy

## 2019-06-07 DIAGNOSIS — M6281 Muscle weakness (generalized): Secondary | ICD-10-CM

## 2019-06-07 DIAGNOSIS — R262 Difficulty in walking, not elsewhere classified: Secondary | ICD-10-CM

## 2019-06-07 NOTE — Therapy (Signed)
[place] MAIN [facility] SERVICES [address] [place], [place], [address] Phone: [phone]   Fax:  [phone]  Physical Therapy Treatment  Patient Details  Name: [name] MRN: [id number] Date of Birth: 10/16/1945 Referring Provider (PT): Cindra Presume   Encounter Date: 06/07/2019  PT End of Session - 06/07/19 0931    Visit Number  19    Number of Visits  33    Date for PT Re-Evaluation  06/28/19    PT Start Time  0932    PT Stop Time  1015    PT Time Calculation (min)  43 min    Equipment Utilized During Treatment  Gait belt    Activity Tolerance  Patient tolerated treatment well;No increased pain    Behavior During Therapy  WFL for tasks assessed/performed       Past Medical History:  Diagnosis Date  . Anxiety   . Arthritis   . Atrial fibrillation ([place])    a. Dx 2013, recurred 02/2014, CHA2DS2VASc = 3 -->placed on Eliquis;  b. 02/2014 Echo: EF 50-55%, mid and apical anterior septum and mid and apical inf septum are abnl, mild to mod Ao sclerosis w/o AS.  Marland Kitchen Chicken pox   . Chronic lymphocytic leukemia ([place])    a. Dx 02/2014.  Marland Kitchen Complication of anesthesia    History of  PTSD--do not touch patient when waking up from surgery.  Marland Kitchen COPD (chronic obstructive pulmonary disease) ([place])   . Coronary artery disease    a. 04/2009 CABG x 3 (LIMA->LAD, VG->OM1, VG->PDA);  b. 09/2009 Cath: occluded VG x 2 w/ patent LIMA and L->R collats. EF 55%, mild antlat HK;  c. 10/2011 MV: EF 53%, no isch/infarct-->low risk.  Marland Kitchen Dysrhythmia    hx of a-fib  . GERD (gastroesophageal reflux disease)    occasional  . History of chemotherapy 2015-2016  . HOH (hard of hearing)    Bilateral Hearing Aids  . Hypertension   . Myocardial infarction ([place]) 2010  . OSA on CPAP    USE C-PAP  . Presence of permanent cardiac pacemaker 2017  . PTSD (post-traumatic stress disorder)   . PTSD (post-traumatic stress disorder)   . Pure hypercholesterolemia   .  Rheumatic fever 1959  . 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 ABDOMINAL HERNIA REPAIR    . APPENDECTOMY  06/21/1985  . CARDIAC CATHETERIZATION  2010; 2011   ; Dr Fletcher Anon  . CORONARY ARTERY BYPASS GRAFT  04/2009   "CABG X3"  . EP IMPLANTABLE DEVICE N/A 03/03/2016   Procedure: Pacemaker Implant;  Surgeon: Deboraha Sprang, MD;  Location: [facility];  Service: Cardiovascular;  Laterality: N/A;  . FOREIGN BODY REMOVAL  1968   "shrapnel in my tailbone"  . INGUINAL HERNIA REPAIR Right   . INSERT / REPLACE / REMOVE PACEMAKER    . JOINT REPLACEMENT Right 2018  . LAPAROSCOPIC CHOLECYSTECTOMY    . TONSILLECTOMY AND ADENOIDECTOMY  1956  . TOTAL HIP ARTHROPLASTY Right 10/22/2016   Procedure: TOTAL HIP ARTHROPLASTY;  Surgeon: Dereck Leep, MD;  Location: [facility];  Service: Orthopedics;  Laterality: Right;  . TOTAL HIP ARTHROPLASTY Left 11/04/2017   Procedure: TOTAL HIP ARTHROPLASTY;  Surgeon: Dereck Leep, MD;  Location: [facility];  Service: Orthopedics;  Laterality: Left;    There were no vitals filed for this visit.  Subjective Assessment - 06/07/19 3220  Subjective  Patient reports feeling a little better today. He did follow up with MD regarding continued leg pain. he reports he is scheduled for a CT scan and doppler US to assess for possible blockage. Pt reports he was taken off cholesterol medication to see if that could have contributed to leg pain.    Pertinent History  Patient is having pain in BLE legs , but it never goes away. He is able to do his HEP.    Currently in Pain?  Yes    Pain Score  7     Pain Location  Leg    Pain Orientation  Right;Left    Pain Descriptors / Indicators  Aching;Sore    Pain Type  Chronic pain    Pain Onset  More than a month ago    Pain Frequency  Constant    Aggravating Factors   unsure    Pain Relieving Factors  NA    Effect of Pain on Daily Activities  pushes through  the pain;    Multiple Pain Sites  No              TREATMENT:  -Warmup Octane fitness x 5 mins L2 (unbilled)  NMRE: Patient instructed in advanced balance exercise  Standingin parallel bars  Standing on airex beam: -side stepping unsupported x4 laps each direction with close supervision to CGA for safety and cues for increased step length and weight shift for better dynamic balance -tandem stance unsupported 10 sec hold x2 reps each foot in front supervision for safety; -tandem stance unsupported with ball pass side/side x5 reps each foot in front -tandem walk x 4 laps unsupported -tandem walk x1 lap with head turns side/side with CGA for safety; Patient required CGA to close supervision for safety with cues to improve upper trunk control for better balance; Patient had increased difficulty with narrow base of support especially with head turns;   Standing on 1/2 foam: (Flat side up) -heel/toe rocks with feet apart heel/toe rocks x15 with rail assist for safety -feet apart, alternate UE lift x5 reps with CGA for safety and cues to improve ankle strategies for better stance control 1/2 foam round side up: -incline standing eyes open 30 sec hold for calf stretch with cues for weight shift for better stance control; -incline standing eyes closed, unsupported x30 sec with intermittent posterior loss of balance and min-mod A to recover balance with cues for weight shift;  Forward lunges to BOSU with B rail assist for safety x10 each LE with min VCs for proper weight shift and sequencing for optimal muscle activation;    Stepping over orange hurdle: Forward/backward with 2-1-0 rail assist x10 reps  Side step with1-0 rail assist x10 reps each direction Required mod VCs to increase hip flexion and increase step length for better foot clearance  Educated patient in fall recovery with how to get up in case of a fall; Patient will get on all fours and then bring one  leg forward and push through both arms and one leg to stand.  Recommended patient utilize a stool when able to help provide support to push through to ease strain on hips/knee for better floor transfer ability;  Able to complete with close supervision to [place].   Neuro Re-ed: Patient requires CGA during all standing interventions due to limited stability and patient fear of LOB. Cueing for reduction of UE support required as well as postural alignment for optimal stability within COM  Patient demonstratesexcellentmotivation throughout today's  session. Patient was able to perform static and dynamic balance intervention, requiredoccasional UE support. Patient continues to be challenged byinstability of gait and balance on narrow BOS and compliant surfaces, which improved withrepetition and min VC for sequencing.Patient would continue to benefit from skilled PT to address the deficits outlined in this note.                PT Education - 06/07/19 0931    Education Details  balance/strength, HEP    Person(s) Educated  Patient    Methods  Explanation;Verbal cues    Comprehension  Verbalized understanding;Returned demonstration;Verbal cues required;Need further instruction       PT Short Term Goals - 04/18/19 1118      PT SHORT TERM GOAL #1   Title  Patient will be independent in home exercise program to improve strength/mobility for better functional independence with ADLs.    Baseline  Patient partially independent, needs further updating for his HEP.    Time  4    Period  Weeks    Status  On-going    Target Date  05/03/19      PT SHORT TERM GOAL #2   Title  Patient (< 22 years old) will complete five times sit to stand test in < 10 seconds indicating an increased LE strength and improved balance.    Baseline  04/18/2019: 22secs    Time  4    Period  Weeks    Status  New    Target Date  05/03/19        PT Long Term Goals - 04/25/19 0941      PT LONG TERM GOAL #1    Title  Patient (> 107 years old) will complete five times sit to stand test in < 15 seconds indicating an increased LE strength and improved balance.    Baseline  04/25/19=44 .07sec    Time  8    Period  Weeks    Status  Partially Met    Target Date  06/28/19      PT LONG TERM GOAL #2   Title  Patient will increase Berg Balance score by > 6 points to demonstrate decreased fall risk during functional activities.    Baseline  04/25/19=49/56    Time  8    Period  Weeks    Status  Partially Met    Target Date  06/28/19      PT LONG TERM GOAL #3   Title  Patient will increase 10 meter walk test to >1.48ms as to improve gait speed for better community ambulation and to reduce fall risk.    Baseline  04/25/19=.71 m/sec    Time  8    Period  Weeks    Status  Partially Met    Target Date  06/28/19            Plan - 06/07/19 1055    Clinical Impression Statement  Patient motivated and participated well within session. He reports less leg pain today being able to tolerate advanced balance exercises well with less instability. Educated patient in safe floor recovery with instruction to utilize step stool when able to help patient stand up unassisted. Patient does report mild fatigue at end of session. He would benefit from additional skilled PT intervention to improve strength, balance and gait safety; Plan to address goals next session;    Personal Factors and Comorbidities  Age;Comorbidity 1    Comorbidities  COPD,    Examination-Participation Restrictions  Yard Work;Cleaning;Community Activity    Stability/Clinical Decision Making  Evolving/Moderate complexity    Rehab Potential  Fair    PT Frequency  2x / week    PT Duration  8 weeks    PT Treatment/Interventions  Gait training;Therapeutic activities;Balance training;Therapeutic exercise;Patient/family education    PT Next Visit Plan  strengthening and balance    PT Home Exercise Plan  heel raises, hip abd, hip ext YTB    Consulted  and Agree with Plan of Care  Patient       Patient will benefit from skilled therapeutic intervention in order to improve the following deficits and impairments:  Abnormal gait, Decreased balance, Decreased mobility, Difficulty walking, Cardiopulmonary status limiting activity, Decreased activity tolerance, Decreased strength, Pain  Visit Diagnosis: Difficulty in walking, not elsewhere classified  Muscle weakness (generalized)     Problem List Patient Active Problem List   Diagnosis Date Noted  . Elevated glucose 05/13/2019  . Dehydration 03/14/2019  . Gross hematuria 05/12/2018  . Sick sinus syndrome ([place]) 07/08/2016  . Goals of care, counseling/discussion 07/08/2016  . 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 Chronic fatigue   . 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 PAD (peripheral artery disease) ([place])   . OSA on CPAP   . TIA (transient ischemic attack) 11/02/2015  . Chronic diastolic CHF (congestive heart failure) ([place]) 09/20/2015  . CLL (chronic lymphocytic leukemia) ([place])   . PTSD (post-traumatic stress disorder)   . Osteoarthritis of both knees 07/05/2015  . COPD (chronic obstructive pulmonary disease) ([place]) 01/01/2012  . Shortness of breath 10/09/2011  . Paroxysmal atrial fibrillation ([place]) 10/09/2011  . Hyperlipidemia 11/28/2009  . Essential hypertension 11/28/2009    Braxtyn Bojarski PT, DPT 06/07/2019, 10:58 AM  [place] MAIN [facility] SERVICES [address] [place], [place], [address] Phone: [phone]   Fax:  [phone]  Name: EDDY LISZEWSKI MRN: [id number] Date of Birth: 1945/11/14

## 2019-06-09 ENCOUNTER — Other Ambulatory Visit: Payer: Self-pay

## 2019-06-09 ENCOUNTER — Encounter: Payer: Self-pay | Admitting: Physical Therapy

## 2019-06-09 ENCOUNTER — Ambulatory Visit: Payer: Medicare HMO | Admitting: Physical Therapy

## 2019-06-09 DIAGNOSIS — R262 Difficulty in walking, not elsewhere classified: Secondary | ICD-10-CM | POA: Diagnosis not present

## 2019-06-09 DIAGNOSIS — M6281 Muscle weakness (generalized): Secondary | ICD-10-CM

## 2019-06-09 NOTE — Therapy (Signed)
[place] MAIN [facility] SERVICES [address] [place], [place], [address] Phone: [phone]   Fax:  [phone]  Physical Therapy Treatment/Discharge Summary  Patient Details  Name: [name] MRN: [id number] Date of Birth: 1946/05/28 Referring Provider (PT): Cindra Presume   Encounter Date: 06/09/2019  PT End of Session - 06/09/19 1020    Visit Number  20    Number of Visits  33    Date for PT Re-Evaluation  06/28/19    PT Start Time  0935    PT Stop Time  1015    PT Time Calculation (min)  40 min    Equipment Utilized During Treatment  Gait belt    Activity Tolerance  Patient tolerated treatment well;No increased pain    Behavior During Therapy  WFL for tasks assessed/performed       Past Medical History:  Diagnosis Date  . Anxiety   . Arthritis   . Atrial fibrillation ([place])    a. Dx 2013, recurred 02/2014, CHA2DS2VASc = 3 -->placed on Eliquis;  b. 02/2014 Echo: EF 50-55%, mid and apical anterior septum and mid and apical inf septum are abnl, mild to mod Ao sclerosis w/o AS.  Marland Kitchen Chicken pox   . Chronic lymphocytic leukemia ([place])    a. Dx 02/2014.  Marland Kitchen Complication of anesthesia    History of  PTSD--do not touch patient when waking up from surgery.  Marland Kitchen COPD (chronic obstructive pulmonary disease) ([place])   . Coronary artery disease    a. 04/2009 CABG x 3 (LIMA->LAD, VG->OM1, VG->PDA);  b. 09/2009 Cath: occluded VG x 2 w/ patent LIMA and L->R collats. EF 55%, mild antlat HK;  c. 10/2011 MV: EF 53%, no isch/infarct-->low risk.  Marland Kitchen Dysrhythmia    hx of a-fib  . GERD (gastroesophageal reflux disease)    occasional  . History of chemotherapy 2015-2016  . HOH (hard of hearing)    Bilateral Hearing Aids  . Hypertension   . Myocardial infarction ([place]) 2010  . OSA on CPAP    USE C-PAP  . Presence of permanent cardiac pacemaker 2017  . PTSD (post-traumatic stress disorder)   . PTSD (post-traumatic stress disorder)   . Pure  hypercholesterolemia   . Rheumatic fever 1959  . 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 ABDOMINAL HERNIA REPAIR    . APPENDECTOMY  06/21/1985  . CARDIAC CATHETERIZATION  2010; 2011   ; Dr Fletcher Anon  . CORONARY ARTERY BYPASS GRAFT  04/2009   "CABG X3"  . EP IMPLANTABLE DEVICE N/A 03/03/2016   Procedure: Pacemaker Implant;  Surgeon: Deboraha Sprang, MD;  Location: [facility];  Service: Cardiovascular;  Laterality: N/A;  . FOREIGN BODY REMOVAL  1968   "shrapnel in my tailbone"  . INGUINAL HERNIA REPAIR Right   . INSERT / REPLACE / REMOVE PACEMAKER    . JOINT REPLACEMENT Right 2018  . LAPAROSCOPIC CHOLECYSTECTOMY    . TONSILLECTOMY AND ADENOIDECTOMY  1956  . TOTAL HIP ARTHROPLASTY Right 10/22/2016   Procedure: TOTAL HIP ARTHROPLASTY;  Surgeon: Dereck Leep, MD;  Location: [facility];  Service: Orthopedics;  Laterality: Right;  . TOTAL HIP ARTHROPLASTY Left 11/04/2017   Procedure: TOTAL HIP ARTHROPLASTY;  Surgeon: Dereck Leep, MD;  Location: [facility];  Service: Orthopedics;  Laterality: Left;    There were no vitals filed for this visit.  Subjective Assessment - 06/09/19 0941  Subjective  Patient reports no significant change since starting therapy; He reports continued BLE leg pain and thinks it could be related to his cancer medicine;    Pertinent History  Patient is having pain in BLE legs , but it never goes away. He is able to do his HEP.    Currently in Pain?  Yes    Pain Score  9     Pain Location  Leg    Pain Orientation  Right;Left    Pain Descriptors / Indicators  Aching;Sore    Pain Type  Chronic pain    Pain Onset  More than a month ago    Pain Frequency  Constant    Aggravating Factors   unsure    Pain Relieving Factors  NA    Effect of Pain on Daily Activities  pushes through the pain;    Multiple Pain Sites  No         [facility] PT Assessment - 06/09/19 0001      Berg Balance Test   Sit  to Stand  Able to stand  independently using hands    Standing Unsupported  Able to stand safely 2 minutes    Sitting with Back Unsupported but Feet Supported on Floor or Stool  Able to sit safely and securely 2 minutes    Stand to Sit  Controls descent by using hands    Transfers  Able to transfer safely, definite need of hands    Standing Unsupported with Eyes Closed  Able to stand 10 seconds safely    Standing Unsupported with Feet Together  Able to place feet together independently and stand for 1 minute with supervision    From Standing, Reach Forward with Outstretched Arm  Can reach confidently >25 cm (10")    From Standing Position, Pick up Object from Floor  Able to pick up shoe, needs supervision    From Standing Position, Turn to Look Behind Over each Shoulder  Looks behind from both sides and weight shifts well    Turn 360 Degrees  Able to turn 360 degrees safely but slowly    Standing Unsupported, Alternately Place Feet on Step/Stool  Able to complete >2 steps/needs minimal assist    Standing Unsupported, One Foot in Front  Able to plae foot ahead of the other independently and hold 30 seconds    Standing on One Leg  Able to lift leg independently and hold equal to or more than 3 seconds    Total Score  43        TREATMENT: Warm up on Crosstrainer, level 2 x4 min (Unbilled);  PT instructed patient in outcome measures including 10 meter walk, 5 times sit<>Stand, timed up and go and Berg Balance Assessment, see below;    OUTCOME MEASURES: TEST Outcome 04/18/19  04/25/19  06/09/19 Interpretation  5 times sit<>stand 39.73sec  44.07 sec  28 sec with 2 HHA >60 yo, >15 sec indicates increased risk for falls  10 meter walk test        .57         m/s   0.71 m/s  0.71 m/s with walking stick <1.0 m/s indicates increased risk for falls; limited community ambulator  Timed up and Go        22.18         sec   16.4 sec <14 sec indicates increased risk for falls  Berg balance  39/56  49/56   43/56  <36/56 (100% risk for falls),  37-45 (80% risk for falls); 46-51 (>50% risk for falls); 52-55 (lower risk <25% of falls)                Reinforced HEP; Patient reports independence and adherence with standing exercise including but not limited to 4 way hip, heel/toe raises, marches, etc. He reports that he plans to stay active at home but verbalized understanding to contact rehab if he notices a change in mobility.                 PT Education - 06/09/19 1020    Education Details  progress towards goals, recommendations;    Person(s) Educated  Patient    Methods  Explanation    Comprehension  Verbalized understanding       PT Short Term Goals - 06/09/19 0942      PT SHORT TERM GOAL #1   Title  Patient will be independent in home exercise program to improve strength/mobility for better functional independence with ADLs.    Baseline  Patient partially independent, needs further updating for his HEP.    Time  4    Period  Weeks    Status  Achieved    Target Date  05/03/19      PT SHORT TERM GOAL #2   Title  Patient (< 12 years old) will complete five times sit to stand test in < 10 seconds indicating an increased LE strength and improved balance.    Baseline  04/18/2019: 22secs    Time  4    Period  Weeks    Status  Not Met    Target Date  05/03/19        PT Long Term Goals - 06/09/19 0943      PT LONG TERM GOAL #1   Title  Patient (> 38 years old) will complete five times sit to stand test in < 15 seconds indicating an increased LE strength and improved balance.    Baseline  04/25/19=44 .07sec    Time  8    Period  Weeks    Status  Partially Met    Target Date  06/28/19      PT LONG TERM GOAL #2   Title  Patient will increase Berg Balance score by > 6 points to demonstrate decreased fall risk during functional activities.    Baseline  04/25/19=49/56    Time  8    Period  Weeks    Status  Partially Met    Target Date  06/28/19      PT LONG  TERM GOAL #3   Title  Patient will increase 10 meter walk test to >1.13ms as to improve gait speed for better community ambulation and to reduce fall risk.    Baseline  04/25/19=.71 m/sec    Time  8    Period  Weeks    Status  Partially Met    Target Date  06/28/19            Plan - 06/09/19 1020    Clinical Impression Statement  Patient tolerated session well. Instructed patient in outcome measures to address progress towards goals. He does exhibit improved sit<>Stand ability but continues to require 2 HHA to push up on chair. He is walking at a home ambulator speed, but is still considered a high risk for falls and limited community ambulator. Patient is concerned that his prolonged leg pain could be related to cancer medication or possibly poor circulation. He is having medical tests  done in the next several weeks for additional follow up. Overall patient has made some improvements, but has plateaued in last month. He is independent and adherent with HEP. Recommend discharge from PT at this time. Patient is agreeable and plans to continue with exercises at home.    Personal Factors and Comorbidities  Age;Comorbidity 1    Comorbidities  COPD,    Examination-Participation Restrictions  Yard Work;Cleaning;Community Activity    Stability/Clinical Decision Making  Evolving/Moderate complexity    Rehab Potential  Fair    PT Frequency  2x / week    PT Duration  8 weeks    PT Treatment/Interventions  Gait training;Therapeutic activities;Balance training;Therapeutic exercise;Patient/family education    PT Next Visit Plan  strengthening and balance    PT Home Exercise Plan  heel raises, hip abd, hip ext YTB    Consulted and Agree with Plan of Care  Patient       Patient will benefit from skilled therapeutic intervention in order to improve the following deficits and impairments:  Abnormal gait, Decreased balance, Decreased mobility, Difficulty walking, Cardiopulmonary status limiting activity,  Decreased activity tolerance, Decreased strength, Pain  Visit Diagnosis: Difficulty in walking, not elsewhere classified  Muscle weakness (generalized)     Problem List Patient Active Problem List   Diagnosis Date Noted  . Elevated glucose 05/13/2019  . Dehydration 03/14/2019  . Gross hematuria 05/12/2018  . Sick sinus syndrome ([place]) 07/08/2016  . Goals of care, counseling/discussion 07/08/2016  . 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 Chronic fatigue   . 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 PAD (peripheral artery disease) ([place])   . OSA on CPAP   . TIA (transient ischemic attack) 11/02/2015  . Chronic diastolic CHF (congestive heart failure) ([place]) 09/20/2015  . CLL (chronic lymphocytic leukemia) ([place])   . PTSD (post-traumatic stress disorder)   . Osteoarthritis of both knees 07/05/2015  . COPD (chronic obstructive pulmonary disease) ([place]) 01/01/2012  . Shortness of breath 10/09/2011  . Paroxysmal atrial fibrillation ([place]) 10/09/2011  . Hyperlipidemia 11/28/2009  . Essential hypertension 11/28/2009    Maryalice Pasley PT, DPT 06/09/2019, 10:28 AM  [place] MAIN [facility] SERVICES [address] [place], [place], [address] Phone: [phone]   Fax:  [phone]  Name: COULTER OLDAKER MRN: [id number] Date of Birth: Feb 04, 1946

## 2019-06-10 ENCOUNTER — Ambulatory Visit (INDEPENDENT_AMBULATORY_CARE_PROVIDER_SITE_OTHER): Payer: Medicare HMO | Admitting: *Deleted

## 2019-06-10 DIAGNOSIS — I495 Sick sinus syndrome: Secondary | ICD-10-CM | POA: Diagnosis not present

## 2019-06-10 DIAGNOSIS — I480 Paroxysmal atrial fibrillation: Secondary | ICD-10-CM

## 2019-06-10 LAB — CUP PACEART REMOTE DEVICE CHECK
Battery Remaining Longevity: 77 mo
Battery Voltage: 3.01 V
Brady Statistic AP VP Percent: 0.07 %
Brady Statistic AP VS Percent: 93.77 %
Brady Statistic AS VP Percent: 0 %
Brady Statistic AS VS Percent: 6.15 %
Brady Statistic RA Percent Paced: 93.83 %
Brady Statistic RV Percent Paced: 0.08 %
Date Time Interrogation Session: 20201120091245
Implantable Lead Implant Date: 20170814
Implantable Lead Implant Date: 20170814
Implantable Lead Location: 753859
Implantable Lead Location: 753860
Implantable Lead Model: 5076
Implantable Lead Model: 5076
Implantable Pulse Generator Implant Date: 20170814
Lead Channel Impedance Value: 342 Ohm
Lead Channel Impedance Value: 456 Ohm
Lead Channel Impedance Value: 513 Ohm
Lead Channel Impedance Value: 608 Ohm
Lead Channel Pacing Threshold Amplitude: 0.875 V
Lead Channel Pacing Threshold Amplitude: 0.875 V
Lead Channel Pacing Threshold Pulse Width: 0.4 ms
Lead Channel Pacing Threshold Pulse Width: 0.4 ms
Lead Channel Sensing Intrinsic Amplitude: 23.75 mV
Lead Channel Sensing Intrinsic Amplitude: 23.75 mV
Lead Channel Sensing Intrinsic Amplitude: 4.375 mV
Lead Channel Sensing Intrinsic Amplitude: 4.375 mV
Lead Channel Setting Pacing Amplitude: 2 V
Lead Channel Setting Pacing Amplitude: 2.5 V
Lead Channel Setting Pacing Pulse Width: 0.4 ms
Lead Channel Setting Sensing Sensitivity: 0.9 mV

## 2019-06-11 DIAGNOSIS — G4733 Obstructive sleep apnea (adult) (pediatric): Secondary | ICD-10-CM | POA: Diagnosis not present

## 2019-06-14 ENCOUNTER — Ambulatory Visit: Payer: Medicare HMO | Admitting: Physical Therapy

## 2019-06-17 MED FILL — VENCLEXTA 100 MG TABS: 100 | 30 days supply | Qty: 60 | Fill #2

## 2019-06-20 ENCOUNTER — Other Ambulatory Visit: Payer: Self-pay | Admitting: *Deleted

## 2019-06-20 ENCOUNTER — Other Ambulatory Visit: Payer: Self-pay | Admitting: Nurse Practitioner

## 2019-06-20 MED ORDER — ACYCLOVIR 400 MG PO TABS: 400.0000 mg | ORAL_TABLET | Freq: Two times a day (BID) | ORAL | 3 refills | Status: DC

## 2019-06-20 NOTE — Telephone Encounter (Signed)
This has been filled today already

## 2019-06-21 ENCOUNTER — Ambulatory Visit: Payer: Medicare HMO | Admitting: Physical Therapy

## 2019-06-23 ENCOUNTER — Ambulatory Visit: Payer: Medicare HMO | Admitting: Physical Therapy

## 2019-07-03 ENCOUNTER — Other Ambulatory Visit: Payer: Self-pay | Admitting: Internal Medicine

## 2019-07-04 ENCOUNTER — Other Ambulatory Visit: Payer: Self-pay | Admitting: Oncology

## 2019-07-04 MED ORDER — MONTELUKAST SODIUM 10 MG PO TABS: 10.0000 mg | ORAL_TABLET | Freq: Every day | ORAL | 0 refills | Status: DC

## 2019-07-04 MED ORDER — ACYCLOVIR 400 MG PO TABS: 400.0000 mg | ORAL_TABLET | Freq: Two times a day (BID) | ORAL | 3 refills | Status: DC

## 2019-07-04 NOTE — Telephone Encounter (Signed)
Dr. B patient  

## 2019-07-05 ENCOUNTER — Telehealth: Payer: Self-pay | Admitting: *Deleted

## 2019-07-05 NOTE — Telephone Encounter (Signed)
Patient was asking about his acyclovir for his shingles I informed him that it was sent last night by Dr Janese Banks to [facility] and also informed him that he no longer needs to take the Singulair. He stated he understands that and that he will tell pharmacy that he does not need that prescription.

## 2019-07-05 NOTE — Telephone Encounter (Signed)
Patient called asking why his prescription was denied. I see that Singulair weas denied on 07/03/19. Please advise.

## 2019-07-05 NOTE — Telephone Encounter (Signed)
Per v/o Dr. Rogue Bussing - patient no longer needs to be on Singulair since he is no longer on gazva.

## 2019-07-06 ENCOUNTER — Other Ambulatory Visit: Payer: Self-pay | Admitting: Internal Medicine

## 2019-07-06 ENCOUNTER — Other Ambulatory Visit: Payer: Self-pay | Admitting: Licensed Clinical Social Worker

## 2019-07-06 ENCOUNTER — Telehealth: Payer: Self-pay | Admitting: *Deleted

## 2019-07-06 DIAGNOSIS — R7309 Other abnormal glucose: Secondary | ICD-10-CM

## 2019-07-06 NOTE — Progress Notes (Signed)
No chemo planned in Jan 2021.

## 2019-07-06 NOTE — Telephone Encounter (Signed)
Sorry I did not answer this but I sent a message to the provider at the [facility] to add it so he does not have to come here.  Dhruvi Crenshaw,cma

## 2019-07-06 NOTE — Telephone Encounter (Signed)
Ok. Thank you! NP

## 2019-07-06 NOTE — Telephone Encounter (Signed)
[name]. Dr. Marlaine Hind office already notified Dr. B of the need for adding hgba1c. This has already been added per v/o Dr. Rogue Bussing at next visit.

## 2019-07-06 NOTE — Telephone Encounter (Signed)
Gae Bon from Dr Memory Dance office called asking if we would draw an A1C on this patient with his lab draw tomorrow. Please advise

## 2019-07-10 ENCOUNTER — Other Ambulatory Visit: Payer: Self-pay | Admitting: Cardiovascular Disease

## 2019-07-11 ENCOUNTER — Other Ambulatory Visit: Payer: Self-pay

## 2019-07-11 ENCOUNTER — Ambulatory Visit (INDEPENDENT_AMBULATORY_CARE_PROVIDER_SITE_OTHER): Payer: Medicare HMO

## 2019-07-11 DIAGNOSIS — I739 Peripheral vascular disease, unspecified: Secondary | ICD-10-CM | POA: Diagnosis not present

## 2019-07-11 DIAGNOSIS — G4733 Obstructive sleep apnea (adult) (pediatric): Secondary | ICD-10-CM | POA: Diagnosis not present

## 2019-07-11 NOTE — Progress Notes (Signed)
Remote pacemaker transmission.   

## 2019-07-13 DIAGNOSIS — Z08 Encounter for follow-up examination after completed treatment for malignant neoplasm: Secondary | ICD-10-CM | POA: Diagnosis not present

## 2019-07-13 DIAGNOSIS — D2262 Melanocytic nevi of left upper limb, including shoulder: Secondary | ICD-10-CM | POA: Diagnosis not present

## 2019-07-13 DIAGNOSIS — D225 Melanocytic nevi of trunk: Secondary | ICD-10-CM | POA: Diagnosis not present

## 2019-07-13 DIAGNOSIS — Z85828 Personal history of other malignant neoplasm of skin: Secondary | ICD-10-CM | POA: Diagnosis not present

## 2019-07-13 DIAGNOSIS — D2261 Melanocytic nevi of right upper limb, including shoulder: Secondary | ICD-10-CM | POA: Diagnosis not present

## 2019-07-13 DIAGNOSIS — L570 Actinic keratosis: Secondary | ICD-10-CM | POA: Diagnosis not present

## 2019-07-19 MED FILL — VENCLEXTA 100 MG TABS: 100 | 30 days supply | Qty: 60 | Fill #3

## 2019-08-03 ENCOUNTER — Other Ambulatory Visit: Payer: Self-pay

## 2019-08-04 ENCOUNTER — Other Ambulatory Visit: Payer: Self-pay

## 2019-08-04 ENCOUNTER — Inpatient Hospital Stay (HOSPITAL_BASED_OUTPATIENT_CLINIC_OR_DEPARTMENT_OTHER): Payer: Medicare HMO | Admitting: Internal Medicine

## 2019-08-04 ENCOUNTER — Inpatient Hospital Stay: Payer: Medicare HMO | Attending: Internal Medicine

## 2019-08-04 ENCOUNTER — Ambulatory Visit: Payer: Medicare HMO

## 2019-08-04 VITALS — BP 156/72 | HR 81 | Temp 97.3°F | Wt 229.0 lb

## 2019-08-04 DIAGNOSIS — M791 Myalgia, unspecified site: Secondary | ICD-10-CM

## 2019-08-04 DIAGNOSIS — Z7901 Long term (current) use of anticoagulants: Secondary | ICD-10-CM | POA: Insufficient documentation

## 2019-08-04 DIAGNOSIS — R252 Cramp and spasm: Secondary | ICD-10-CM | POA: Diagnosis not present

## 2019-08-04 DIAGNOSIS — I4891 Unspecified atrial fibrillation: Secondary | ICD-10-CM | POA: Diagnosis not present

## 2019-08-04 DIAGNOSIS — C9110 Chronic lymphocytic leukemia of B-cell type not having achieved remission: Secondary | ICD-10-CM | POA: Insufficient documentation

## 2019-08-04 DIAGNOSIS — Z87891 Personal history of nicotine dependence: Secondary | ICD-10-CM | POA: Insufficient documentation

## 2019-08-04 DIAGNOSIS — M2550 Pain in unspecified joint: Secondary | ICD-10-CM | POA: Diagnosis not present

## 2019-08-04 DIAGNOSIS — R7309 Other abnormal glucose: Secondary | ICD-10-CM

## 2019-08-04 DIAGNOSIS — Z79899 Other long term (current) drug therapy: Secondary | ICD-10-CM | POA: Insufficient documentation

## 2019-08-04 LAB — COMPREHENSIVE METABOLIC PANEL
ALT: 36 U/L (ref 0–44)
AST: 23 U/L (ref 15–41)
Albumin: 4.4 g/dL (ref 3.5–5.0)
Alkaline Phosphatase: 89 U/L (ref 38–126)
Anion gap: 10 (ref 5–15)
BUN: 21 mg/dL (ref 8–23)
CO2: 25 mmol/L (ref 22–32)
Calcium: 9.1 mg/dL (ref 8.9–10.3)
Chloride: 107 mmol/L (ref 98–111)
Creatinine, Ser: 1.11 mg/dL (ref 0.61–1.24)
GFR calc Af Amer: >60 mL/min (ref >60)
GFR calc non Af Amer: >60 mL/min (ref >60)
Glucose, Bld: 127 mg/dL — ABNORMAL HIGH (ref 70–99)
Potassium: 4 mmol/L (ref 3.5–5.1)
Sodium: 142 mmol/L (ref 135–145)
Total Bilirubin: 0.8 mg/dL (ref 0.3–1.2)
Total Protein: 6.4 g/dL — ABNORMAL LOW (ref 6.5–8.1)

## 2019-08-04 LAB — MAGNESIUM: Magnesium: 2.1 mg/dL (ref 1.7–2.4)

## 2019-08-04 LAB — CBC WITH DIFFERENTIAL/PLATELET
Abs Immature Granulocytes: 0.03 10*3/uL (ref 0.00–0.07)
Basophils Absolute: 0 10*3/uL (ref 0.0–0.1)
Basophils Relative: 0 %
Eosinophils Absolute: 0 10*3/uL (ref 0.0–0.5)
Eosinophils Relative: 0 %
HCT: 38.4 % — ABNORMAL LOW (ref 39.0–52.0)
Hemoglobin: 12.7 g/dL — ABNORMAL LOW (ref 13.0–17.0)
Immature Granulocytes: 1 %
Lymphocytes Relative: 34 %
Lymphs Abs: 2.1 10*3/uL (ref 0.7–4.0)
MCH: 33.1 pg (ref 26.0–34.0)
MCHC: 33.1 g/dL (ref 30.0–36.0)
MCV: 100 fL (ref 80.0–100.0)
Monocytes Absolute: 1 10*3/uL (ref 0.1–1.0)
Monocytes Relative: 15 %
Neutro Abs: 3.1 10*3/uL (ref 1.7–7.7)
Neutrophils Relative %: 50 %
Platelets: 132 10*3/uL — ABNORMAL LOW (ref 150–400)
RBC: 3.84 MIL/uL — ABNORMAL LOW (ref 4.22–5.81)
RDW: 14.5 % (ref 11.5–15.5)
WBC: 6.2 10*3/uL (ref 4.0–10.5)
nRBC: 0 % (ref 0.0–0.2)

## 2019-08-04 LAB — LACTATE DEHYDROGENASE: LDH: 149 U/L (ref 98–192)

## 2019-08-04 LAB — PHOSPHORUS: Phosphorus: 3.7 mg/dL (ref 2.5–4.6)

## 2019-08-04 LAB — URIC ACID: Uric Acid, Serum: 2.9 mg/dL — ABNORMAL LOW (ref 3.7–8.6)

## 2019-08-04 NOTE — Assessment & Plan Note (Addendum)
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 Clinically stable.  # currently on venatoclax  200 mg/day [half the dose-DI with amiodarone]. Labs today reviewed;  acceptable for treatment today.continue white count is 6.2 hemoglobin 12  platelets- 130. Will order CT scans today.   # Worsening fatigue/muscle pain in legs- ? Venatoclax; recent arterial dopplers-Normal. Check CK/aldolase. Discussed that if this continues to the problem would recommend holding antibiotics.  # A.fib-on amiodarone [drug interaction-amiodarone] sinus rhythm-on Eliquis- STABLE.   # TLS/infectious Prophylaxis-allopurinol; Bactrim/acyclovir. STABLE.    # # I discussed regarding Covid-19 precautions.  I reviewed the vaccine effectiveness and potential side effects in detail.  Also discussed long-term effectiveness and safety profile are unclear at this time.  I discussed December, 2020 [facility] position statement-that all patients are recommended COVID-19 vaccinations [when available]-as long as they do not have allergy to components of the vaccine.   DISPOSITION:  # follow up in 37month MD; labs- cbc/cmp/ldh;CT C/A/P prior-Dr.B

## 2019-08-04 NOTE — Progress Notes (Signed)
[facility] OFFICE PROGRESS NOTE  Patient Care Team: Leone Haven, MD as PCP - General (Family Medicine) [name], MD as Consulting Physician (Cardiology) Cammie Sickle, MD as Medical Oncologist (Hematology and Oncology)  Cancer Staging No matching staging information was found for the patient.   Oncology History Overview Note  # AUG 2015- SLL/CLL [Right Ax Ln Bx] s/p Benda-Rituxan x6 [finished March 2016]; Maintenance Rituxan q 49m[started April 2016; Dr.Pandit];Last Ritux Jan 2017.  MARCH 2017- CT N/C/A/P- NED. STOP Ritux; surveillance   # AUG 2019- CT/PET- progression/NO transformation;  # NOV 2019- Progression; started Ibrutinib 420 mg/d. STOPPED in end of feb sec to extreme fatigue/joint pains/cramps  #November 01, 2018-start ibrutinib 280 mg a day; December 20, 2018-discontinue ibrutinib secondary multiple side effects.   #January 03, 2019 start GOliver[July]; finished Dec 2020-; started VLawson Fiscalmaintenance [200 mg a day; DI-amiodarone]    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; IGVH- UN-MUTATED [poor prognosis]  SURVIVORSHIP: p  DIAGNOSIS: CLL   STAGE: IV  ;GOALS: control  CURRENT/MOST RECENT THERAPY : venetoclax     C[place]ocytic [place]) [place]  01/03/2019 -  Chemotherapy   The patient had obinutuzumab (GAZYVA) 100 mg in sodium chloride 0.9 % 100 mL (0.9615 mg/mL) chemo infusion, 100 mg, Intravenous, Once, 6 of 6 cycles Administration: 100 mg (01/03/2019), 900 mg (01/04/2019), 1,000 mg (01/10/2019), 1,000 mg (01/31/2019), 1,000 mg (01/17/2019), 1,000 mg (02/28/2019), 1,000 mg (04/07/2019), 1,000 mg (05/05/2019), 1,000 mg (06/02/2019)  for chemotherapy treatment.      INTERVAL HISTORY:  Michael [id number].  male CLL-high risk currently on GDyann Kiefis here for  follow-up; currently status post 5 cycles is here for follow-up.  Patient currently on venetoclax  taking 200 mg a day [drug interaction with amiodarone].  Patient continues to have cramping in the legs.  He is is concerned more about peripheral vascular disease.  Continues with joint pains which are fairly steady-since starting physical therapy.  No falls.  No lumps or bumps.  No chills.   Review of Systems  Constitutional: Positive for malaise/fatigue. Negative for chills, diaphoresis and fever.  HENT: Negative for nosebleeds and sore throat.   Eyes: Negative for double vision.  Respiratory: Negative for cough, hemoptysis, sputum production, shortness of breath and wheezing.   Cardiovascular: Negative for chest pain, palpitations, orthopnea and leg swelling.  Gastrointestinal: Negative for abdominal pain, blood in stool, constipation, diarrhea, heartburn, melena, nausea and vomiting.  Genitourinary: Negative for dysuria, frequency and urgency.  Musculoskeletal: Positive for back pain and joint pain.  Skin: Negative.  Negative for itching and rash.  Neurological: Negative for dizziness, tingling, focal weakness and headaches.  Psychiatric/Behavioral: Negative for depression. The patient is not nervous/anxious and does not have insomnia.       PAST MEDICAL HISTORY :  Past Medical History:  Diagnosis Date  . Anxiety   . Arthritis   . [place]rial fibrillation ([place]    a. Dx 2013, recurred 02/2014, CHA2DS2VASc = 3 -->placed on Eliquis;  b. 02/2014 Echo: EF 50-55%, mid and apical anterior septum and mid and apical inf septum are abnl, mild to mod Ao sclerosis w/o AS.  .Marland KitchenChicken pox   . 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    a. Dx 02/2014.  .Marland KitchenComplication of anesthesia  History of  PTSD--do not touch patient when waking up from surgery.  Marland Kitchen COPD (chronic obstructive pulmonary disease) ([place])   . Coronary artery disease    a. 04/2009 CABG x 3 (LIMA->LAD, VG->OM1, VG->PDA);  b. 09/2009 Cath:  occluded VG x 2 w/ patent LIMA and L->R collats. EF 55%, mild antlat HK;  c. 10/2011 MV: EF 53%, no isch/infarct-->low risk.  Marland Kitchen Dysrhythmia    hx of a-fib  . GERD (gastroesophageal reflux disease)    occasional  . History of chemotherapy 2015-2016  . HOH (hard of hearing)    Bilateral Hearing Aids  . Hypertension   . Myocardial infarction ([place]) 2010  . OSA on CPAP    USE C-PAP  . Presence of permanent cardiac pacemaker 2017  . PTSD (post-traumatic stress disorder)   . PTSD (post-traumatic stress disorder)   . Pure hypercholesterolemia   . Rheumatic fever 1959  . 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 ABDOMINAL HERNIA REPAIR    . APPENDECTOMY  06/21/1985  . CARDIAC CATHETERIZATION  2010; 2011   ; Dr Fletcher Anon  . CORONARY ARTERY BYPASS GRAFT  04/2009   "CABG X3"  . EP IMPLANTABLE DEVICE N/A 03/03/2016   Procedure: Pacemaker Implant;  Surgeon: Deboraha Sprang, MD;  Location: [facility];  Service: Cardiovascular;  Laterality: N/A;  . FOREIGN BODY REMOVAL  1968   "shrapnel in my tailbone"  . INGUINAL HERNIA REPAIR Right   . INSERT / REPLACE / REMOVE PACEMAKER    . JOINT REPLACEMENT Right 2018  . LAPAROSCOPIC CHOLECYSTECTOMY    . TONSILLECTOMY AND ADENOIDECTOMY  1956  . TOTAL HIP ARTHROPLASTY Right 10/22/2016   Procedure: TOTAL HIP ARTHROPLASTY;  Surgeon: Dereck Leep, MD;  Location: [facility];  Service: Orthopedics;  Laterality: Right;  . TOTAL HIP ARTHROPLASTY Left 11/04/2017   Procedure: TOTAL HIP ARTHROPLASTY;  Surgeon: Dereck Leep, MD;  Location: [facility];  Service: Orthopedics;  Laterality: Left;    FAMILY HISTORY :   Family History  Problem Relation Age of Onset  . Heart disease Mother   . Heart attack Mother   . Coronary artery disease Other        family history    SOCIAL HISTORY:   Social History   Tobacco Use  . Smoking status: Former Smoker    Packs/day:  1.00    Years: 40.00    Pack years: 40.00    Types: Cigarettes    Quit date: 07/21/2006    Years since quitting: 13.0  . Smokeless tobacco: Never Used  Substance Use Topics  . Alcohol use: Not Currently  . Drug use: No    ALLERGIES:  has No Known Allergies.  MEDICATIONS:  Current Outpatient Medications  Medication Sig Dispense Refill  . acetaminophen (TYLENOL) 500 MG tablet Take 1,000 mg by mouth every 8 (eight) hours as needed for mild pain.    Marland Kitchen acyclovir (ZOVIRAX) 400 MG tablet Take 1 tablet (400 mg total) by mouth 2 (two) times daily. 60 tablet 3  . albuterol (PROVENTIL HFA;VENTOLIN HFA) 108 (90 Base) MCG/ACT inhaler Inhale 2 puffs into the lungs every 6 (six) hours as needed for wheezing or shortness of breath. 1 Inhaler 11  . allopurinol (ZYLOPRIM) 300 MG tablet Take 1 tablet by mouth twice daily 120 tablet 3  . amiodarone (PACERONE) 200 MG tablet Take 1/2 (one-half) tablet by mouth twice  daily 90 tablet 0  . apixaban (ELIQUIS) 5 MG TABS tablet Take 5 mg by mouth 2 (two) times daily.    . budesonide-formoterol (SYMBICORT) 160-4.5 MCG/ACT inhaler Inhale 2 puffs into the lungs 2 (two) times daily. 1 Inhaler 11  . cetirizine (ZYRTEC) 10 MG tablet Take 10 mg by mouth daily as needed for allergies.     . Coenzyme Q10 (COQ10) 200 MG CAPS Take 200 mg by mouth daily.    Marland Kitchen dexamethasone (DECADRON) 4 MG tablet Start 2 days prior to infusion; Take for 2 days. Do not take on the day of infusion. 60 tablet 3  . furosemide (LASIX) 20 MG tablet Take 1 tablet (20 mg) by mouth twice daily as needed    . Krill Oil 350 MG CAPS Take 350 mg by mouth as needed.     . metoprolol tartrate (LOPRESSOR) 25 MG tablet Take 25 mg by mouth 2 (two) times daily.    . mirtazapine (REMERON) 15 MG tablet Take 15 mg by mouth at bedtime as needed (for panic associated with PTSD).     . Multiple Vitamin (MULTIVITAMIN WITH MINERALS) TABS tablet Take 1 tablet by mouth daily.    . mupirocin ointment (BACTROBAN) 2 % Apply  1 application topically 3 (three) times daily. 30 g 1  . nitroGLYCERIN (NITROSTAT) 0.4 MG SL tablet Place 1 tablet (0.4 mg total) under the tongue every 5 (five) minutes as needed for chest pain. 25 tablet 6  . tiotropium (SPIRIVA) 18 MCG inhalation capsule Place 1 capsule (18 mcg total) into inhaler and inhale at bedtime. 30 capsule 11  . traMADol (ULTRAM) 50 MG tablet TAKE 1 TABLET BY MOUTH EVERY 12 HOURS AS NEEDED FOR MODERATE PAIN 90 tablet 0  . venetoclax (VENCLEXTA) 100 MG TABS Take 200 mg by mouth daily. (Take 2 tablets daily) 60 tablet 6  . atorvastatin (LIPITOR) 80 MG tablet Take 1 tablet (80 mg total) by mouth at bedtime. 90 tablet 3  . ezetimibe (ZETIA) 10 MG tablet Take 1 tablet (10 mg total) by mouth daily. (Patient not taking: Reported on 08/03/2019) 90 tablet 3  . montelukast (SINGULAIR) 10 MG tablet Take 1 tablet (10 mg total) by mouth at bedtime. Start 2 days prior to infusion. Take it for 4 days. 60 tablet 0   No current facility-administered medications for this visit.    PHYSICAL EXAMINATION: ECOG PERFORMANCE STATUS: 1 - Symptomatic but completely ambulatory  BP (!) 156/72 (BP Location: Left Arm, Patient Position: Sitting, Cuff Size: Normal)   Pulse 81   Temp (!) 97.3 F (36.3 C) (Tympanic)   Wt 229 lb (103.9 kg)   BMI 33.82 kg/m   Filed Weights   08/04/19 0845  Weight: 229 lb (103.9 kg)    Physical Exam  Constitutional: He is oriented to person, place, and time and well-developed, well-nourished, and in no distress.  Patient is alone.  HENT:  Head: Normocephalic and atraumatic.  Mouth/Throat: Oropharynx is clear and moist. No oropharyngeal exudate.  Eyes: Pupils are equal, round, and reactive to light.  Cardiovascular: Normal rate and regular rhythm.  Pulmonary/Chest: No respiratory distress. He has no wheezes.  Abdominal: Soft. Bowel sounds are normal. He exhibits no distension and no mass. There is no abdominal tenderness. There is no rebound and no  guarding.  Musculoskeletal:        General: No tenderness or edema. Normal range of motion.     Cervical back: Normal range of motion and neck supple.  Lymphadenopathy:  Resolved lymph nodes in the neck underarms.  Neurological: He is alert and oriented to person, place, and time.  Skin: Skin is warm.  Psychiatric: Affect normal.    LABORATORY DATA:  I have reviewed the data as listed    Component Value Date/Time   NA 142 08/04/2019 0822   NA 139 10/11/2014 1800   K 4.0 08/04/2019 0822   K 3.3 (L) 10/11/2014 1800   CL 107 08/04/2019 0822   CL 106 10/11/2014 1800   CO2 25 08/04/2019 0822   CO2 27 10/11/2014 1800   GLUCOSE 127 (H) 08/04/2019 0822   GLUCOSE 107 (H) 10/11/2014 1800   BUN 21 08/04/2019 0822   BUN 15 10/11/2014 1800   CREATININE 1.11 08/04/2019 0822   CREATININE 0.89 10/11/2014 1800   CALCIUM 9.1 08/04/2019 0822   CALCIUM 8.8 (L) 10/11/2014 1800   PROT 6.4 (L) 08/04/2019 0822   PROT 6.7 05/18/2017 1048   PROT 6.4 (L) 10/11/2014 1800   ALBUMIN 4.4 08/04/2019 0822   ALBUMIN 4.3 05/18/2017 1048   ALBUMIN 4.1 10/11/2014 1800   AST 23 08/04/2019 0822   AST 23 10/11/2014 1800   ALT 36 08/04/2019 0822   ALT 22 10/11/2014 1800   ALKPHOS 89 08/04/2019 0822   ALKPHOS 61 10/11/2014 1800   BILITOT 0.8 08/04/2019 0822   BILITOT 0.7 05/18/2017 1048   BILITOT 0.9 10/11/2014 1800   GFRNONAA >60 08/04/2019 0822   GFRNONAA >60 10/11/2014 1800   GFRAA >60 08/04/2019 0822   GFRAA >60 10/11/2014 1800    No results found for: SPEP, UPEP  Lab Results  Component Value Date   WBC 6.2 08/04/2019   NEUTROABS 3.1 08/04/2019   HGB 12.7 (L) 08/04/2019   HCT 38.4 (L) 08/04/2019   MCV 100.0 08/04/2019   PLT 132 (L) 08/04/2019      Chemistry      Component Value Date/Time   NA 142 08/04/2019 0822   NA 139 10/11/2014 1800   K 4.0 08/04/2019 0822   K 3.3 (L) 10/11/2014 1800   CL 107 08/04/2019 0822   CL 106 10/11/2014 1800   CO2 25 08/04/2019 0822   CO2 27 10/11/2014  1800   BUN 21 08/04/2019 0822   BUN 15 10/11/2014 1800   CREATININE 1.11 08/04/2019 0822   CREATININE 0.89 10/11/2014 1800      Component Value Date/Time   CALCIUM 9.1 08/04/2019 0822   CALCIUM 8.8 (L) 10/11/2014 1800   ALKPHOS 89 08/04/2019 0822   ALKPHOS 61 10/11/2014 1800   AST 23 08/04/2019 0822   AST 23 10/11/2014 1800   ALT 36 08/04/2019 0822   ALT 22 10/11/2014 1800   BILITOT 0.8 08/04/2019 0822   BILITOT 0.7 05/18/2017 1048   BILITOT 0.9 10/11/2014 1800       RADIOGRAPHIC STUDIES: I have personally reviewed the radiological images as listed and agreed with the findings in the report. No results found.   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 Clinically stable.  # currently on venatoclax  200 mg/day [half the dose-DI with amiodarone]. Labs today reviewed;  acceptable for treatment today.continue white count is 6.2 hemoglobin 12  platelets- 130. Will order CT scans today.   # Worsening fatigue/muscle pain in legs- ? Venatoclax; recent arterial dopplers-Normal. Check CK/aldolase. Discussed that if this continues to the problem would recommend holding antibiotics.  # A.fib-on amiodarone [drug interaction-amiodarone] sinus rhythm-on  Eliquis- STABLE.   # TLS/infectious Prophylaxis-allopurinol; Bactrim/acyclovir. STABLE.    # # I discussed regarding Covid-19 precautions.  I reviewed the vaccine effectiveness and potential side effects in detail.  Also discussed long-term effectiveness and safety profile are unclear at this time.  I discussed December, 2020 ASCO position statement-that all patients are recommended COVID-19 vaccinations [when available]-as long as they do not have allergy to components of the vaccine.   DISPOSITION:  # follow up in 85month MD; labs- cbc/cmp/ldh;CT C/A/P prior-Dr.B    Orders Placed This Encounter  Procedures  .  CT Chest W Contrast    Standing Status:   Future    Standing Expiration Date:   08/03/2020    Order Specific Question:   If indicated for the ordered procedure, I authorize the administration of contrast media per Radiology protocol    Answer:   Yes    Order Specific Question:   Preferred imaging location?    Answer:   [facility]    Order Specific Question:   Radiology Contrast Protocol - do NOT remove file path    Answer:   [email or web address]    Order Specific Question:   ** REASON FOR EXAM (FREE TEXT)    Answer:   CLL  . CT ABDOMEN PELVIS W CONTRAST    Standing Status:   Future    Standing Expiration Date:   08/03/2020    Order Specific Question:   If indicated for the ordered procedure, I authorize the administration of contrast media per Radiology protocol    Answer:   Yes    Order Specific Question:   Preferred imaging location?    Answer:   [facility]    Order Specific Question:   Radiology Contrast Protocol - do NOT remove file path    Answer:   [email or web address]    Order Specific Question:   ** REASON FOR EXAM (FREE TEXT)    Answer:   CLL  . Aldolase    Standing Status:   Future    Standing Expiration Date:   08/03/2020  . CBC with Differential    Standing Status:   Future    Standing Expiration Date:   08/03/2020  . Comprehensive metabolic panel    Standing Status:   Future    Standing Expiration Date:   08/03/2020  . Lactate dehydrogenase    Standing Status:   Future    Standing Expiration Date:   08/03/2020   All questions were answered. The patient knows to call the clinic with any problems, questions or concerns.      GCammie Sickle MD 08/05/2019 7:55 AM

## 2019-08-09 ENCOUNTER — Other Ambulatory Visit: Payer: Self-pay | Admitting: *Deleted

## 2019-08-09 LAB — HEMOGLOBIN A1C
Hgb A1c MFr Bld: 5.7 % — ABNORMAL HIGH (ref 4.8–5.6)
Mean Plasma Glucose: 116.89 mg/dL

## 2019-08-11 DIAGNOSIS — G4733 Obstructive sleep apnea (adult) (pediatric): Secondary | ICD-10-CM | POA: Diagnosis not present

## 2019-08-17 MED FILL — VENCLEXTA 100 MG TABS: 100 | 30 days supply | Qty: 60 | Fill #4

## 2019-08-29 ENCOUNTER — Other Ambulatory Visit: Payer: Self-pay

## 2019-08-29 ENCOUNTER — Ambulatory Visit
Admission: RE | Admit: 2019-08-29 | Discharge: 2019-08-29 | Disposition: A | Discharge status: Home / Self Care | Payer: Medicare HMO | Source: Ambulatory Visit | Attending: Internal Medicine | Admitting: Internal Medicine

## 2019-08-29 DIAGNOSIS — C9110 Chronic lymphocytic leukemia of B-cell type not having achieved remission: Secondary | ICD-10-CM | POA: Diagnosis not present

## 2019-08-29 IMAGING — CT CT ABD-PELV W/ CM
2 of 7 series · 13 of 36 positions shown, 16 images · IV contrast (omnipaque)
Comparison: [DATE]

CLINICAL DATA: CLL. Restaging.

EXAM:
CT CHEST, ABDOMEN, AND PELVIS WITH CONTRAST
TECHNIQUE: Multidetector CT imaging of the chest, abdomen and pelvis was
performed following the standard protocol during bolus
administration of intravenous contrast.
CONTRAST:  125mL OMNIPAQUE IOHEXOL 300 MG/ML  SOLN

[Series 3: thins · axial · 0.88mm/px · z∈[-693,-96]mm · 10 of 961 slices shown, 13 images]
[im 54/961  mediastinal]
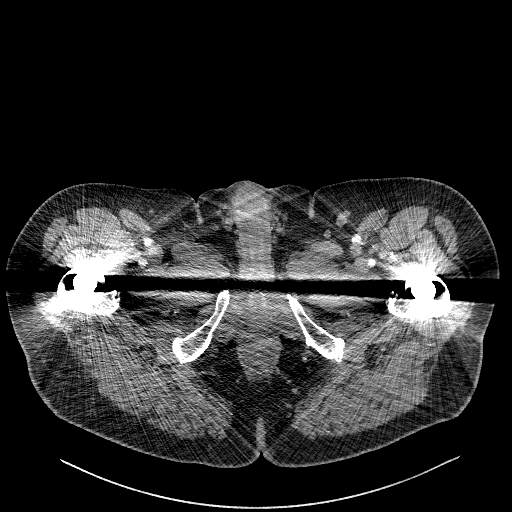
[im 54/961  lung]
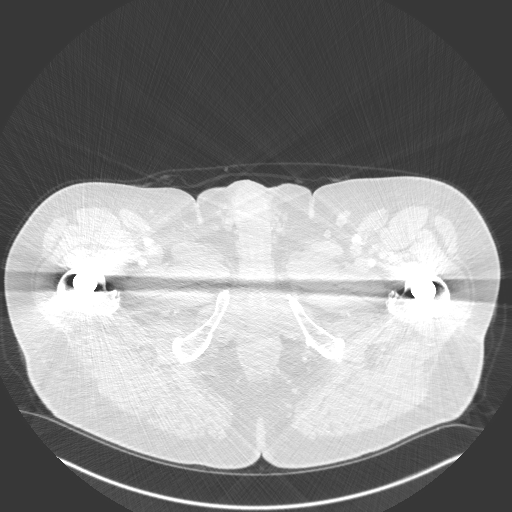
[im 161/961  lung]
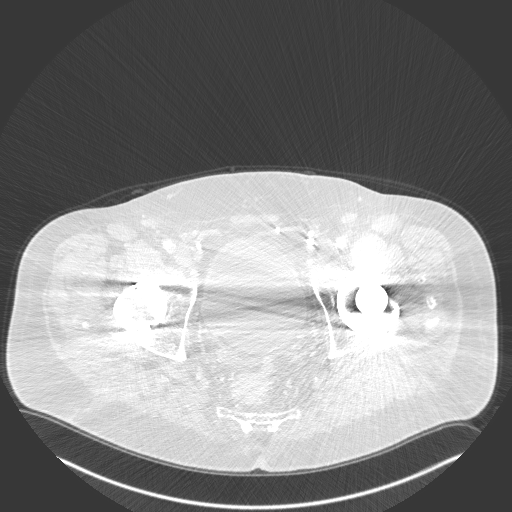
[im 267/961  lung]
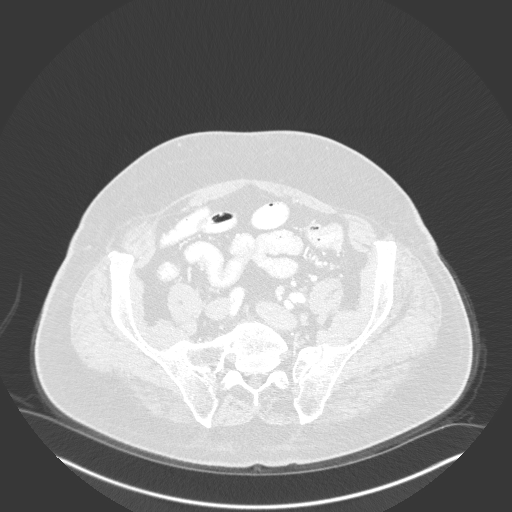
[im 321/961  lung]
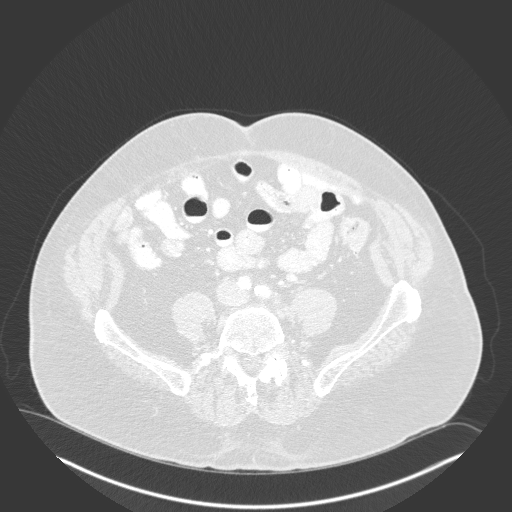
[im 427/961  mediastinal]
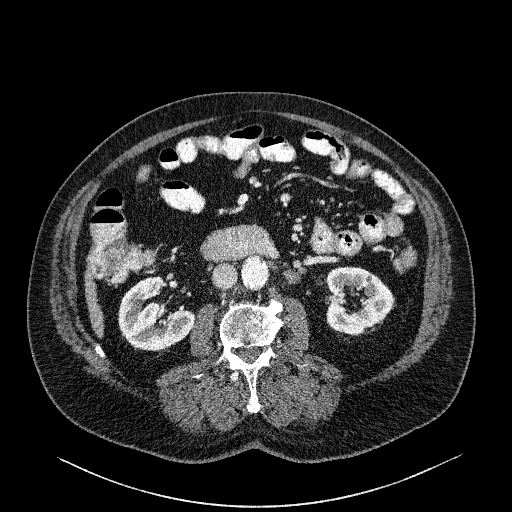
[im 427/961  lung]
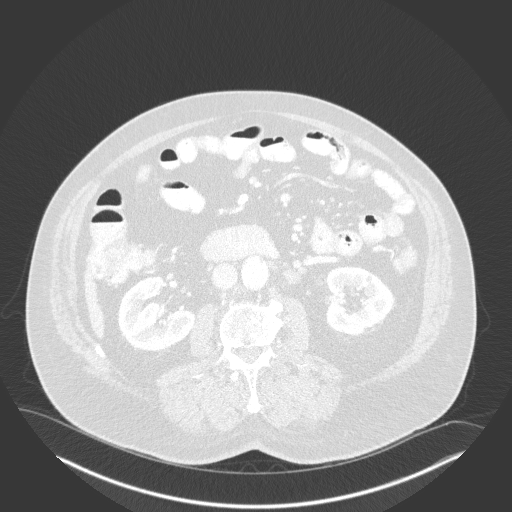
[im 534/961  lung]
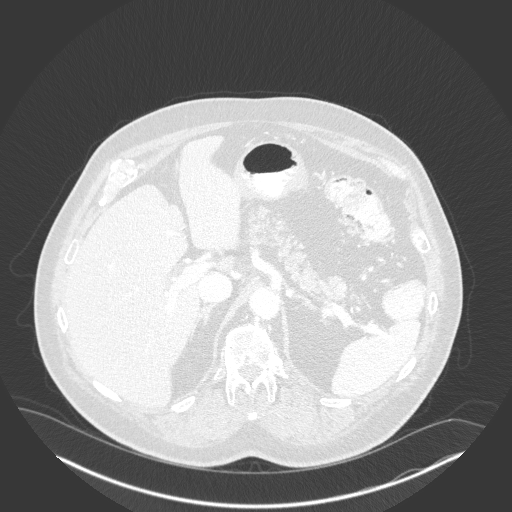
[im 641/961  lung]
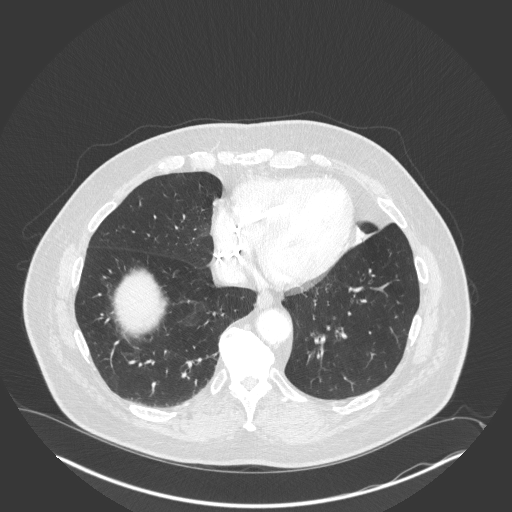
[im 694/961  lung]
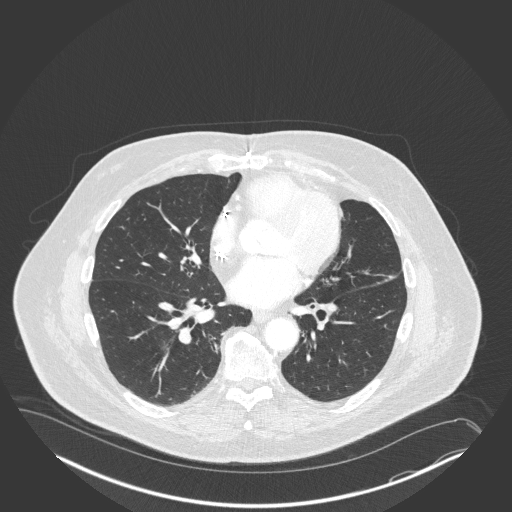
[im 801/961  mediastinal]
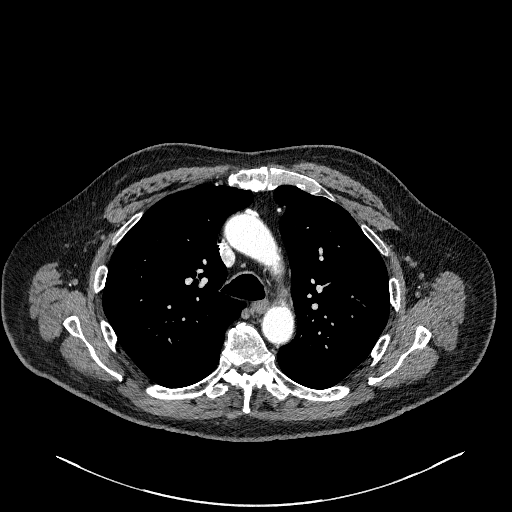
[im 801/961  lung]
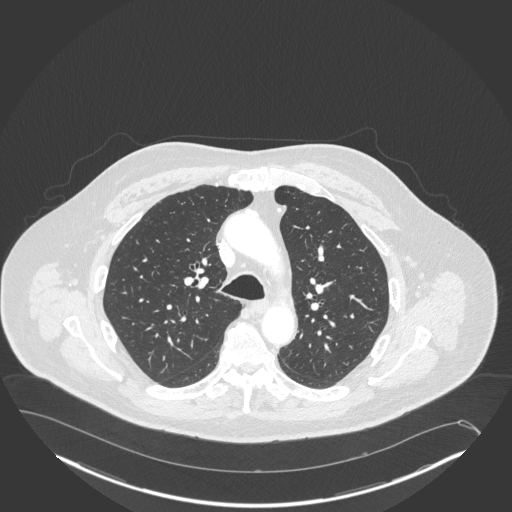
[im 907/961  lung]
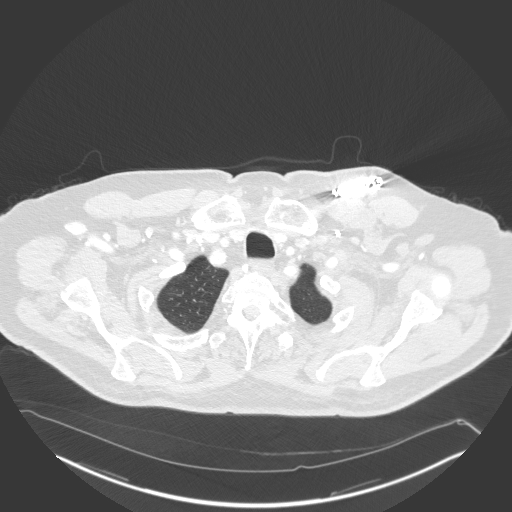

[Series 5: coronals · coronal · 0.82mm/px · 3 of 177 slices shown]
[im 36/177  lung]
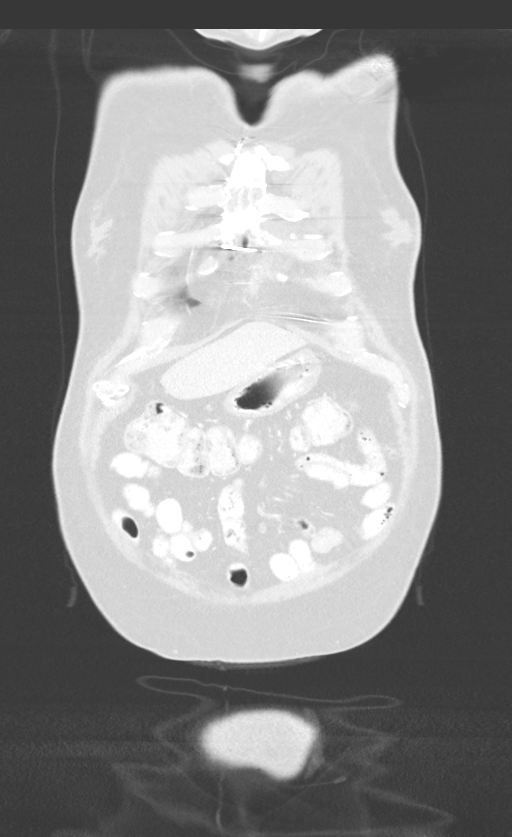
[im 71/177  lung]
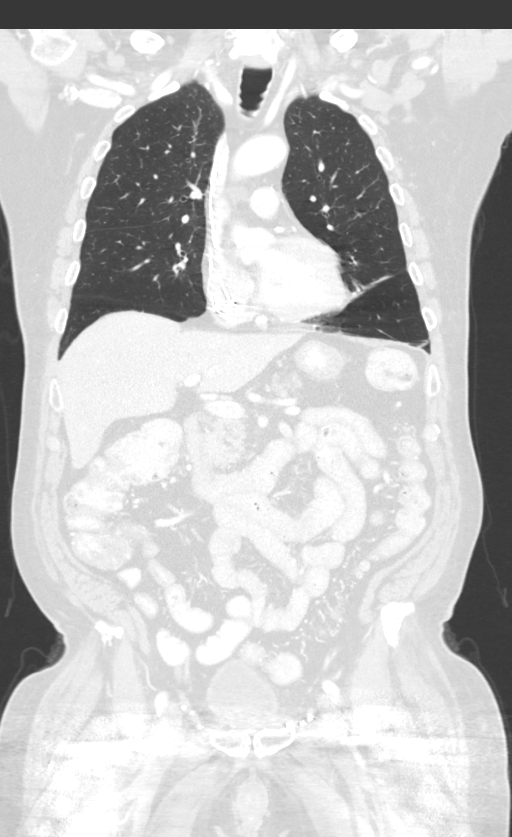
[im 106/177  lung]
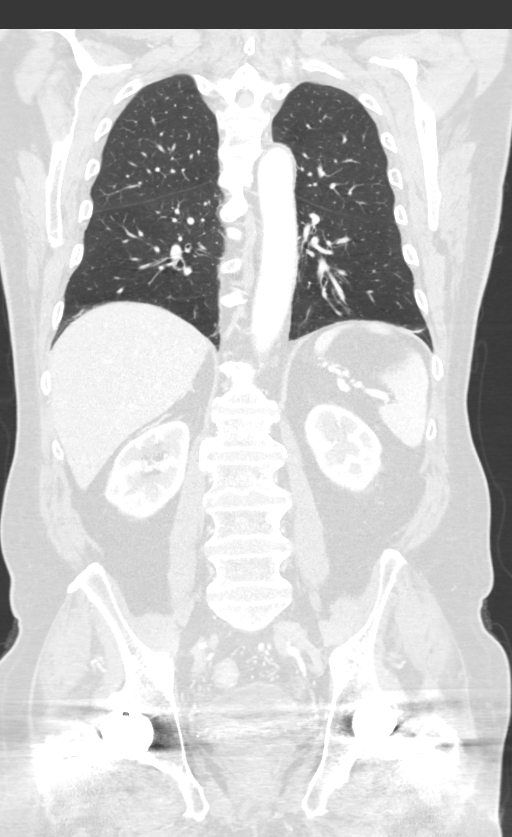

[13 of 36 positions shown; findings below may reference images not displayed]

FINDINGS: CT CHEST FINDINGS

Cardiovascular: The heart size appears within normal limits. There
is aortic atherosclerosis. Previous median sternotomy and CABG
procedure. Left chest wall pacer device is noted with lead in the
right atrial appendage and right ventricle.

Mediastinum/Nodes: Normal appearance of the thyroid gland. The
trachea appears patent and is midline. Normal appearance of the
esophagus. Interval resolution of previous axillary adenopathy. No
supraclavicular, mediastinal or hilar adenopathy.

Lungs/Pleura: No pleural effusion. No suspicious pulmonary nodules
or masses.

Musculoskeletal: No chest wall mass or suspicious bone lesions
identified.

CT ABDOMEN PELVIS FINDINGS

Hepatobiliary: No focal liver abnormality is seen. Status post
cholecystectomy. No biliary dilatation.

Pancreas: Unremarkable. No pancreatic ductal dilatation or
surrounding inflammatory changes.

Spleen: Normal in size without focal abnormality.

Adrenals/Urinary Tract: Normal appearance of the adrenal glands.
Right kidney cyst is again noted. No suspicious mass or
hydronephrosis. Urinary bladder is obscured by streak artifact from
bilateral hip prostheses.

Stomach/Bowel: Stomach is within normal limits. No evidence of bowel
wall thickening, distention, or inflammatory changes.

Vascular/Lymphatic: Aortic atherosclerosis. Infrarenal abdominal
aorta is dilated measuring 2.9 cm, image 75/2.

Index porta hepatic lymph node measures 0.7 cm, image 62/2.
Previously 1.5 cm.

Index left periaortic lymph node measures 1.1 cm, image 74/2.
Previously 2.0 cm.

Index left common iliac node measures 0.7 cm short axis, image 92/2.
Previously 1 cm, image 92/2.

Reproductive: Prostate gland enlargement.

Other: No ascites or focal fluid collections. Previous bilateral
inguinal herniorrhaphy.

Musculoskeletal: Status post bilateral hip arthroplasty. Spondylosis
identified within the lumbar spine.
IMPRESSION: 1. Interval response to therapy. Continued regression of adenopathy
within the chest, abdomen and pelvis.
2. Infrarenal abdominal aortic ectasia. Ectatic abdominal aorta at
risk for aneurysm development. Recommend followup by ultrasound in 5
years. This recommendation follows ACR consensus guidelines: White
Paper of the ACR Incidental Findings Committee II on Vascular
Findings. [HOSPITAL] [H7]; [DATE].

Aortic Atherosclerosis ([H7]-[H7]).

## 2019-08-29 MED ORDER — IOHEXOL 300 MG/ML  SOLN: 100.0000 mL | Freq: Once | INTRAMUSCULAR | Status: DC | PRN

## 2019-08-29 MED ORDER — IOHEXOL 300 MG/ML  SOLN
125.0000 mL | Freq: Once | INTRAMUSCULAR | Status: AC | PRN
  Administered 2019-08-29: 125 mL via INTRAVENOUS

## 2019-09-01 ENCOUNTER — Other Ambulatory Visit: Payer: Self-pay

## 2019-09-01 ENCOUNTER — Inpatient Hospital Stay: Payer: Medicare HMO | Attending: Internal Medicine

## 2019-09-01 ENCOUNTER — Inpatient Hospital Stay (HOSPITAL_BASED_OUTPATIENT_CLINIC_OR_DEPARTMENT_OTHER): Payer: Medicare HMO | Admitting: Internal Medicine

## 2019-09-01 ENCOUNTER — Encounter: Payer: Self-pay | Admitting: Internal Medicine

## 2019-09-01 DIAGNOSIS — Z87891 Personal history of nicotine dependence: Secondary | ICD-10-CM | POA: Insufficient documentation

## 2019-09-01 DIAGNOSIS — C9110 Chronic lymphocytic leukemia of B-cell type not having achieved remission: Secondary | ICD-10-CM

## 2019-09-01 DIAGNOSIS — I4891 Unspecified atrial fibrillation: Secondary | ICD-10-CM | POA: Diagnosis not present

## 2019-09-01 DIAGNOSIS — M1990 Unspecified osteoarthritis, unspecified site: Secondary | ICD-10-CM | POA: Insufficient documentation

## 2019-09-01 DIAGNOSIS — R252 Cramp and spasm: Secondary | ICD-10-CM | POA: Insufficient documentation

## 2019-09-01 DIAGNOSIS — Z7901 Long term (current) use of anticoagulants: Secondary | ICD-10-CM | POA: Insufficient documentation

## 2019-09-01 DIAGNOSIS — M791 Myalgia, unspecified site: Secondary | ICD-10-CM

## 2019-09-01 LAB — CBC WITH DIFFERENTIAL/PLATELET
Abs Immature Granulocytes: 0.03 10*3/uL (ref 0.00–0.07)
Basophils Absolute: 0 10*3/uL (ref 0.0–0.1)
Basophils Relative: 1 %
Eosinophils Absolute: 0 10*3/uL (ref 0.0–0.5)
Eosinophils Relative: 0 %
HCT: 40.4 % (ref 39.0–52.0)
Hemoglobin: 13 g/dL (ref 13.0–17.0)
Immature Granulocytes: 1 %
Lymphocytes Relative: 33 %
Lymphs Abs: 2.1 10*3/uL (ref 0.7–4.0)
MCH: 32.1 pg (ref 26.0–34.0)
MCHC: 32.2 g/dL (ref 30.0–36.0)
MCV: 99.8 fL (ref 80.0–100.0)
Monocytes Absolute: 0.9 10*3/uL (ref 0.1–1.0)
Monocytes Relative: 15 %
Neutro Abs: 3.2 10*3/uL (ref 1.7–7.7)
Neutrophils Relative %: 50 %
Platelets: 150 10*3/uL (ref 150–400)
RBC: 4.05 MIL/uL — ABNORMAL LOW (ref 4.22–5.81)
RDW: 13.9 % (ref 11.5–15.5)
WBC: 6.3 10*3/uL (ref 4.0–10.5)
nRBC: 0 % (ref 0.0–0.2)

## 2019-09-01 LAB — COMPREHENSIVE METABOLIC PANEL
ALT: 41 U/L (ref 0–44)
AST: 26 U/L (ref 15–41)
Albumin: 4.2 g/dL (ref 3.5–5.0)
Alkaline Phosphatase: 96 U/L (ref 38–126)
Anion gap: 9 (ref 5–15)
BUN: 19 mg/dL (ref 8–23)
CO2: 25 mmol/L (ref 22–32)
Calcium: 8.8 mg/dL — ABNORMAL LOW (ref 8.9–10.3)
Chloride: 105 mmol/L (ref 98–111)
Creatinine, Ser: 1.02 mg/dL (ref 0.61–1.24)
GFR calc Af Amer: >60 mL/min (ref >60)
GFR calc non Af Amer: >60 mL/min (ref >60)
Glucose, Bld: 116 mg/dL — ABNORMAL HIGH (ref 70–99)
Potassium: 3.5 mmol/L (ref 3.5–5.1)
Sodium: 139 mmol/L (ref 135–145)
Total Bilirubin: 1.1 mg/dL (ref 0.3–1.2)
Total Protein: 6.8 g/dL (ref 6.5–8.1)

## 2019-09-01 LAB — LACTATE DEHYDROGENASE: LDH: 167 U/L (ref 98–192)

## 2019-09-01 NOTE — Assessment & Plan Note (Addendum)
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 STABLE;FEB 2021- CT- continued PR.   # Currently on venatoclax  200 mg/day [half the dose-DI with amiodarone]. Labs today reviewed;  acceptable for treatment today.continue white count is 6.2 hemoglobin 12  platelets- 130.  Discussed regarding holding venetoclax given his worsening fatigue/muscle pain.  Patient is nervous/wants to continue therapy.  # Worsening fatigue/muscle pain in legs- ? Venatoclax; CK-N/aldolase-n; Discussed about holding/dose reduction- wants to continue current therapy/see above.  # A.fib-on amiodarone [drug interaction-amiodarone] sinus rhythm-on Eliquis-stable.  DISPOSITION:  # follow up in 43month MD; labs- cbc/cmp/ldh;-Dr.B

## 2019-09-01 NOTE — Progress Notes (Signed)
Michael OFFICE PROGRESS NOTE  Patient Care Team: [name], Michael as PCP - General (Family Medicine) [name], Michael as Consulting Physician (Cardiology) [name], Michael as Medical Oncologist (Hematology and Oncology)  Cancer Staging No matching staging information was found for the patient.   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  MARCH 2017- CT N/C/A/P- NED. STOP Ritux; surveillance   # AUG 2019- CT/PET- progression/NO transformation;  # NOV 2019- Progression; started Ibrutinib 420 mg/d. STOPPED in end of feb sec to extreme fatigue/joint pains/cramps  #November 01, 2018-start ibrutinib 280 mg a day; December 20, 2018-discontinue ibrutinib secondary multiple side effects.   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; IGVH- UN-MUTATED [poor prognosis]  SURVIVORSHIP: p  DIAGNOSIS: CLL   STAGE: IV  ;GOALS: control  CURRENT/MOST RECENT THERAPY : venetoclax     CLL (chronic lymphocytic leukemia) ([place]  01/03/2019 -  Chemotherapy   The patient had obinutuzumab (GAZYVA) 100 mg in sodium chloride 0.9 % 100 mL (0.9615 mg/mL) chemo infusion, 100 mg, Intravenous, Once, 6 of 6 cycles Administration: 100 mg (01/03/2019), 900 mg (01/04/2019), 1,000 mg (01/10/2019), 1,000 mg (01/31/2019), 1,000 mg (01/17/2019), 1,000 mg (02/28/2019), 1,000 mg (04/07/2019), 1,000 mg (05/05/2019), 1,000 mg (06/02/2019)  for chemotherapy treatment.      INTERVAL HISTORY:  Michael GUTHRIE89y.o.  male CLL-high risk currently s/p Gazyvax 6 cycles; on  venetoclax  taking 200 mg a day [drug interaction with amiodarone]-is here for follow-up to review the results of the CT scan.  Patient continues to have cramping in the legs.  Denies any worsening shortness of breath or cough.  Denies any chest pain. No new lumps or bumps.  Chronic arthritic pain in joints.  Review of Systems  Constitutional: Positive for malaise/fatigue. Negative for chills, diaphoresis and fever.  HENT: Negative for nosebleeds and sore throat.   Eyes: Negative for double vision.  Respiratory: Negative for cough, hemoptysis, sputum production, shortness of breath and wheezing.   Cardiovascular: Negative for chest pain, palpitations, orthopnea and leg swelling.  Gastrointestinal: Negative for abdominal pain, blood in stool, constipation, diarrhea, heartburn, melena, nausea and vomiting.  Genitourinary: Negative for dysuria, frequency and urgency.  Musculoskeletal: Positive for back pain and joint pain.  Skin: Negative.  Negative for itching and rash.  Neurological: Negative for dizziness, tingling, focal weakness and headaches.  Psychiatric/Behavioral: Negative for depression. The patient is not nervous/anxious and does not have insomnia.       PAST MEDICAL HISTORY :  Past Medical History:  Diagnosis Date  . Anxiety   . Arthritis   . Atrial fibrillation ([place]    a. Dx 2013, recurred 02/2014, CHA2DS2VASc = 3 -->placed on Eliquis;  b. 02/2014 Echo: EF 50-55%, mid and apical anterior septum and mid and apical inf septum are abnl, mild to mod Ao sclerosis w/o AS.  .Marland KitchenChicken pox   . Chronic lymphocytic leukemia ([place]    a. Dx 02/2014.  .Marland KitchenComplication of anesthesia    History of  PTSD--do not touch patient when waking up from  surgery.  Marland Kitchen COPD (chronic obstructive pulmonary disease) ([place])   . Coronary artery disease    a. 04/2009 CABG x 3 (LIMA->LAD, VG->OM1, VG->PDA);  b. 09/2009 Cath: occluded VG x 2 w/ patent LIMA and L->R collats. EF 55%, mild antlat HK;  c. 10/2011 MV: EF  53%, no isch/infarct-->low risk.  Marland Kitchen Dysrhythmia    hx of a-fib  . GERD (gastroesophageal reflux disease)    occasional  . History of chemotherapy 2015-2016  . HOH (hard of hearing)    Bilateral Hearing Aids  . Hypertension   . Myocardial infarction ([place]) 2010  . OSA on CPAP    USE C-PAP  . Presence of permanent cardiac pacemaker 2017  . PTSD (post-traumatic stress disorder)   . PTSD (post-traumatic stress disorder)   . Pure hypercholesterolemia   . Rheumatic fever 1959  . 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 ABDOMINAL HERNIA REPAIR    . APPENDECTOMY  06/21/1985  . CARDIAC CATHETERIZATION  2010; 2011   ; Dr Fletcher Anon  . CORONARY ARTERY BYPASS GRAFT  04/2009   "CABG X3"  . EP IMPLANTABLE DEVICE N/A 03/03/2016   Procedure: Pacemaker Implant;  Surgeon: Deboraha Sprang, Michael;  Location: [facility];  Service: Cardiovascular;  Laterality: N/A;  . FOREIGN BODY REMOVAL  1968   "shrapnel in my tailbone"  . INGUINAL HERNIA REPAIR Right   . INSERT / REPLACE / REMOVE PACEMAKER    . JOINT REPLACEMENT Right 2018  . LAPAROSCOPIC CHOLECYSTECTOMY    . TONSILLECTOMY AND ADENOIDECTOMY  1956  . TOTAL HIP ARTHROPLASTY Right 10/22/2016   Procedure: TOTAL HIP ARTHROPLASTY;  Surgeon: Dereck Leep, Michael;  Location: [facility];  Service: Orthopedics;  Laterality: Right;  . TOTAL HIP ARTHROPLASTY Left 11/04/2017   Procedure: TOTAL HIP ARTHROPLASTY;  Surgeon: Dereck Leep, Michael;  Location: [facility];  Service: Orthopedics;  Laterality: Left;    FAMILY HISTORY :   Family History  Problem Relation Age of Onset  . Heart disease Mother   . Heart attack Mother   . Coronary artery disease Other        family history    SOCIAL HISTORY:   Social History   Tobacco Use  . Smoking status: Former Smoker    Packs/day: 1.00    Years: 40.00    Pack years: 40.00    Types: Cigarettes    Quit date: 07/21/2006     Years since quitting: 13.1  . Smokeless tobacco: Never Used  Substance Use Topics  . Alcohol use: Not Currently  . Drug use: No    ALLERGIES:  has No Known Allergies.  MEDICATIONS:  Current Outpatient Medications  Medication Sig Dispense Refill  . acetaminophen (TYLENOL) 500 MG tablet Take 1,000 mg by mouth every 8 (eight) hours as needed for mild pain.    Marland Kitchen acyclovir (ZOVIRAX) 400 MG tablet Take 1 tablet (400 mg total) by mouth 2 (two) times daily. 60 tablet 3  . albuterol (PROVENTIL HFA;VENTOLIN HFA) 108 (90 Base) MCG/ACT inhaler Inhale 2 puffs into the lungs every 6 (six) hours as needed for wheezing or shortness of breath. 1 Inhaler 11  . allopurinol (ZYLOPRIM) 300 MG tablet Take 1 tablet by mouth twice daily 120 tablet 3  . amiodarone (PACERONE) 200 MG tablet Take 1/2 (one-half) tablet by mouth twice daily 90 tablet 0  . apixaban (ELIQUIS) 5 MG TABS  tablet Take 5 mg by mouth 2 (two) times daily.    . budesonide-formoterol (SYMBICORT) 160-4.5 MCG/ACT inhaler Inhale 2 puffs into the lungs 2 (two) times daily. 1 Inhaler 11  . cetirizine (ZYRTEC) 10 MG tablet Take 10 mg by mouth daily as needed for allergies.     . Coenzyme Q10 (COQ10) 200 MG CAPS Take 200 mg by mouth daily.    Marland Kitchen dexamethasone (DECADRON) 4 MG tablet Start 2 days prior to infusion; Take for 2 days. Do not take on the day of infusion. 60 tablet 3  . ezetimibe (ZETIA) 10 MG tablet Take 1 tablet (10 mg total) by mouth daily. 90 tablet 3  . furosemide (LASIX) 20 MG tablet Take 1 tablet (20 mg) by mouth twice daily as needed    . Krill Oil 350 MG CAPS Take 350 mg by mouth as needed.     . metoprolol tartrate (LOPRESSOR) 25 MG tablet Take 25 mg by mouth 2 (two) times daily.    . mirtazapine (REMERON) 15 MG tablet Take 15 mg by mouth at bedtime as needed (for panic associated with PTSD).     . Multiple Vitamin (MULTIVITAMIN WITH MINERALS) TABS tablet Take 1 tablet by mouth daily.    . mupirocin ointment (BACTROBAN) 2 % Apply 1  application topically 3 (three) times daily. 30 g 1  . nitroGLYCERIN (NITROSTAT) 0.4 MG SL tablet Place 1 tablet (0.4 mg total) under the tongue every 5 (five) minutes as needed for chest pain. 25 tablet 6  . tiotropium (SPIRIVA) 18 MCG inhalation capsule Place 1 capsule (18 mcg total) into inhaler and inhale at bedtime. 30 capsule 11  . traMADol (ULTRAM) 50 MG tablet TAKE 1 TABLET BY MOUTH EVERY 12 HOURS AS NEEDED FOR MODERATE PAIN 90 tablet 0  . venetoclax (VENCLEXTA) 100 MG TABS Take 200 mg by mouth daily. (Take 2 tablets daily) 60 tablet 6  . atorvastatin (LIPITOR) 80 MG tablet Take 1 tablet (80 mg total) by mouth at bedtime. 90 tablet 3  . montelukast (SINGULAIR) 10 MG tablet Take 1 tablet (10 mg total) by mouth at bedtime. Start 2 days prior to infusion. Take it for 4 days. 60 tablet 0   No current facility-administered medications for this visit.    PHYSICAL EXAMINATION: ECOG PERFORMANCE STATUS: 1 - Symptomatic but completely ambulatory  BP (!) 152/81 (BP Location: Left Arm, Patient Position: Sitting, Cuff Size: Normal)   Pulse 66   Temp (!) 96.6 F (35.9 C) (Tympanic)   Wt 227 lb (103 kg)   BMI 33.52 kg/m   Filed Weights   09/01/19 1000  Weight: 227 lb (103 kg)    Physical Exam  Constitutional: He is oriented to person, place, and time and well-developed, well-nourished, and in no distress.  Patient is alone.  HENT:  Head: Normocephalic and atraumatic.  Mouth/Throat: Oropharynx is clear and moist. No oropharyngeal exudate.  Eyes: Pupils are equal, round, and reactive to light.  Cardiovascular: Normal rate and regular rhythm.  Pulmonary/Chest: No respiratory distress. He has no wheezes.  Abdominal: Soft. Bowel sounds are normal. He exhibits no distension and no mass. There is no abdominal tenderness. There is no rebound and no guarding.  Musculoskeletal:        General: No tenderness or edema. Normal range of motion.     Cervical back: Normal range of motion and neck  supple.  Lymphadenopathy:  Resolved lymph nodes in the neck underarms.  Neurological: He is alert and oriented to person, place,  and time.  Skin: Skin is warm.  Psychiatric: Affect normal.    LABORATORY DATA:  I have reviewed the data as listed    Component Value Date/Time   NA 139 09/01/2019 0948   NA 139 10/11/2014 1800   K 3.5 09/01/2019 0948   K 3.3 (L) 10/11/2014 1800   CL 105 09/01/2019 0948   CL 106 10/11/2014 1800   CO2 25 09/01/2019 0948   CO2 27 10/11/2014 1800   GLUCOSE 116 (H) 09/01/2019 0948   GLUCOSE 107 (H) 10/11/2014 1800   BUN 19 09/01/2019 0948   BUN 15 10/11/2014 1800   CREATININE 1.02 09/01/2019 0948   CREATININE 0.89 10/11/2014 1800   CALCIUM 8.8 (L) 09/01/2019 0948   CALCIUM 8.8 (L) 10/11/2014 1800   PROT 6.8 09/01/2019 0948   PROT 6.7 05/18/2017 1048   PROT 6.4 (L) 10/11/2014 1800   ALBUMIN 4.2 09/01/2019 0948   ALBUMIN 4.3 05/18/2017 1048   ALBUMIN 4.1 10/11/2014 1800   AST 26 09/01/2019 0948   AST 23 10/11/2014 1800   ALT 41 09/01/2019 0948   ALT 22 10/11/2014 1800   ALKPHOS 96 09/01/2019 0948   ALKPHOS 61 10/11/2014 1800   BILITOT 1.1 09/01/2019 0948   BILITOT 0.7 05/18/2017 1048   BILITOT 0.9 10/11/2014 1800   GFRNONAA >60 09/01/2019 0948   GFRNONAA >60 10/11/2014 1800   GFRAA >60 09/01/2019 0948   GFRAA >60 10/11/2014 1800    No results found for: SPEP, UPEP  Lab Results  Component Value Date   WBC 6.3 09/01/2019   NEUTROABS 3.2 09/01/2019   HGB 13.0 09/01/2019   HCT 40.4 09/01/2019   MCV 99.8 09/01/2019   PLT 150 09/01/2019      Chemistry      Component Value Date/Time   NA 139 09/01/2019 0948   NA 139 10/11/2014 1800   K 3.5 09/01/2019 0948   K 3.3 (L) 10/11/2014 1800   CL 105 09/01/2019 0948   CL 106 10/11/2014 1800   CO2 25 09/01/2019 0948   CO2 27 10/11/2014 1800   BUN 19 09/01/2019 0948   BUN 15 10/11/2014 1800   CREATININE 1.02 09/01/2019 0948   CREATININE 0.89 10/11/2014 1800      Component Value  Date/Time   CALCIUM 8.8 (L) 09/01/2019 0948   CALCIUM 8.8 (L) 10/11/2014 1800   ALKPHOS 96 09/01/2019 0948   ALKPHOS 61 10/11/2014 1800   AST 26 09/01/2019 0948   AST 23 10/11/2014 1800   ALT 41 09/01/2019 0948   ALT 22 10/11/2014 1800   BILITOT 1.1 09/01/2019 0948   BILITOT 0.7 05/18/2017 1048   BILITOT 0.9 10/11/2014 1800       RADIOGRAPHIC STUDIES: I have personally reviewed the radiological images as listed and agreed with the findings in the report. No results found.   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 STABLE;FEB 2021- CT- continued PR.   # Currently on venatoclax  200 mg/day [half the dose-DI with amiodarone]. Labs today reviewed;  acceptable for treatment today.continue white count is 6.2 hemoglobin 12  platelets- 130.  Discussed regarding holding venetoclax given his worsening fatigue/muscle pain.  Patient is nervous/wants to continue therapy.  # Worsening fatigue/muscle pain in legs- ? Venatoclax; CK-N/aldolase-n; Discussed about holding/dose reduction- wants to continue current therapy/see above.  # A.fib-on amiodarone [drug interaction-amiodarone] sinus rhythm-on Eliquis-stable.  DISPOSITION:  # follow up in 70month Michael;  labs- cbc/cmp/ldh;-Dr.B    No orders of the defined types were placed in this encounter.  All questions were answered. The patient knows to call the clinic with any problems, questions or concerns.      [name], Michael 09/02/2019 12:35 PM

## 2019-09-02 LAB — ALDOLASE: Aldolase: 5 U/L (ref 3.3–10.3)

## 2019-09-09 ENCOUNTER — Ambulatory Visit (INDEPENDENT_AMBULATORY_CARE_PROVIDER_SITE_OTHER): Payer: Medicare HMO | Admitting: *Deleted

## 2019-09-09 DIAGNOSIS — I495 Sick sinus syndrome: Secondary | ICD-10-CM

## 2019-09-09 LAB — CUP PACEART REMOTE DEVICE CHECK
Battery Remaining Longevity: 76 mo
Battery Voltage: 3.01 V
Brady Statistic AP VP Percent: 0.13 %
Brady Statistic AP VS Percent: 98.33 %
Brady Statistic AS VP Percent: 0 %
Brady Statistic AS VS Percent: 1.54 %
Brady Statistic RA Percent Paced: 98.45 %
Brady Statistic RV Percent Paced: 0.14 %
Date Time Interrogation Session: 20210219112738
Implantable Lead Implant Date: 20170814
Implantable Lead Implant Date: 20170814
Implantable Lead Location: 753859
Implantable Lead Location: 753860
Implantable Lead Model: 5076
Implantable Lead Model: 5076
Implantable Pulse Generator Implant Date: 20170814
Lead Channel Impedance Value: 342 Ohm
Lead Channel Impedance Value: 437 Ohm
Lead Channel Impedance Value: 456 Ohm
Lead Channel Impedance Value: 532 Ohm
Lead Channel Pacing Threshold Amplitude: 0.75 V
Lead Channel Pacing Threshold Amplitude: 1 V
Lead Channel Pacing Threshold Pulse Width: 0.4 ms
Lead Channel Pacing Threshold Pulse Width: 0.4 ms
Lead Channel Sensing Intrinsic Amplitude: 23 mV
Lead Channel Sensing Intrinsic Amplitude: 23 mV
Lead Channel Sensing Intrinsic Amplitude: 3.375 mV
Lead Channel Sensing Intrinsic Amplitude: 3.375 mV
Lead Channel Setting Pacing Amplitude: 2 V
Lead Channel Setting Pacing Amplitude: 2.5 V
Lead Channel Setting Pacing Pulse Width: 0.4 ms
Lead Channel Setting Sensing Sensitivity: 0.9 mV

## 2019-09-09 NOTE — Progress Notes (Signed)
PPM Remote  

## 2019-09-14 MED FILL — VENCLEXTA 100 MG TABS: 100 | 30 days supply | Qty: 60 | Fill #5

## 2019-09-15 ENCOUNTER — Ambulatory Visit: Payer: Medicare HMO | Attending: Internal Medicine

## 2019-09-15 DIAGNOSIS — Z23 Encounter for immunization: Secondary | ICD-10-CM | POA: Insufficient documentation

## 2019-09-15 NOTE — Progress Notes (Signed)
   Covid-19 Vaccination Clinic  Name:  [name]    MRN: [id number] DOB: 1946-04-27  09/15/2019  [name] was observed post Covid-19 immunization for 15 minutes without incidence. He was provided with Vaccine Information Sheet and instruction to access the V-Safe system.   [name] was instructed to call 911 with any severe reactions post vaccine: Marland Kitchen Difficulty breathing  . Swelling of your face and throat  . A fast heartbeat  . A bad rash all over your body  . Dizziness and weakness    Immunizations Administered    Name Date Dose VIS Date Route   Pfizer COVID-19 Vaccine 09/15/2019  9:35 AM 0.3 mL 07/01/2019 Intramuscular   Manufacturer: [place]   Lot: [id number]   [place]: [id number]

## 2019-09-16 ENCOUNTER — Telehealth: Payer: Self-pay | Admitting: Family Medicine

## 2019-09-16 ENCOUNTER — Other Ambulatory Visit: Payer: Self-pay

## 2019-09-16 ENCOUNTER — Telehealth: Payer: Self-pay | Admitting: *Deleted

## 2019-09-16 ENCOUNTER — Encounter: Payer: Self-pay | Admitting: Family Medicine

## 2019-09-16 ENCOUNTER — Ambulatory Visit (INDEPENDENT_AMBULATORY_CARE_PROVIDER_SITE_OTHER): Payer: Medicare HMO | Admitting: Family Medicine

## 2019-09-16 ENCOUNTER — Other Ambulatory Visit: Payer: Self-pay | Admitting: Internal Medicine

## 2019-09-16 DIAGNOSIS — I480 Paroxysmal atrial fibrillation: Secondary | ICD-10-CM

## 2019-09-16 DIAGNOSIS — M1990 Unspecified osteoarthritis, unspecified site: Secondary | ICD-10-CM

## 2019-09-16 DIAGNOSIS — I495 Sick sinus syndrome: Secondary | ICD-10-CM | POA: Diagnosis not present

## 2019-09-16 DIAGNOSIS — J432 Centrilobular emphysema: Secondary | ICD-10-CM

## 2019-09-16 DIAGNOSIS — E782 Mixed hyperlipidemia: Secondary | ICD-10-CM

## 2019-09-16 DIAGNOSIS — Z13220 Encounter for screening for lipoid disorders: Secondary | ICD-10-CM

## 2019-09-16 NOTE — Telephone Encounter (Signed)
lvm to set up 30m follow up-

## 2019-09-16 NOTE — Progress Notes (Signed)
Virtual Visit via telephone Note  This visit type was conducted due to national recommendations for restrictions regarding the COVID-19 pandemic (e.g. social distancing).  This format is felt to be most appropriate for this patient at this time.  All issues noted in this document were discussed and addressed.  No physical exam was performed (except for noted visual exam findings with Video Visits).   I connected with [name] today at 11:00 AM EST by  telephone and verified that I am speaking with the correct person using two identifiers. Location patient: home Location provider: work Persons participating in the virtual visit: patient, provider  I discussed the limitations, risks, security and privacy concerns of performing an evaluation and management service by telephone and the availability of in person appointments. I also discussed with the patient that there may be a patient responsible charge related to this service. The patient expressed understanding and agreed to proceed.  Interactive audio and video telecommunications were attempted between this provider and patient, however failed, due to patient having technical difficulties OR patient did not have access to video capability.  We continued and completed visit with audio only.   Reason for visit: follow-up  HPI: A. fib: Taking amiodarone and Eliquis.  No chest pain, palpitations, or bleeding issues.  Hyperlipidemia: He is no longer on Lipitor or Zetia as he was having myalgias.  The myalgias continued despite stopping those medications.  COPD: Patient notes this is stable.  He does have some chronic dyspnea on exertion though it is unchanged compared to previously.  He continues on Spiriva and Symbicort.  He has not used albuterol recently.  Rare wheezing.  No cough.  Arthritis: Patient notes having joint aches that have been chronic.  They are no worse than usual.  Rarely takes tramadol.   ROS: See pertinent positives  and negatives per HPI.  Past Medical History:  Diagnosis Date  . Anxiety   . Arthritis   . Atrial fibrillation ([place])    a. Dx 2013, recurred 02/2014, CHA2DS2VASc = 3 -->placed on Eliquis;  b. 02/2014 Echo: EF 50-55%, mid and apical anterior septum and mid and apical inf septum are abnl, mild to mod Ao sclerosis w/o AS.  Marland Kitchen Chicken pox   . Chronic lymphocytic leukemia ([place])    a. Dx 02/2014.  Marland Kitchen Complication of anesthesia    History of  PTSD--do not touch patient when waking up from surgery.  Marland Kitchen COPD (chronic obstructive pulmonary disease) ([place])   . Coronary artery disease    a. 04/2009 CABG x 3 (LIMA->LAD, VG->OM1, VG->PDA);  b. 09/2009 Cath: occluded VG x 2 w/ patent LIMA and L->R collats. EF 55%, mild antlat HK;  c. 10/2011 MV: EF 53%, no isch/infarct-->low risk.  Marland Kitchen Dysrhythmia    hx of a-fib  . GERD (gastroesophageal reflux disease)    occasional  . History of chemotherapy 2015-2016  . HOH (hard of hearing)    Bilateral Hearing Aids  . Hypertension   . Myocardial infarction ([place]) 2010  . OSA on CPAP    USE C-PAP  . Presence of permanent cardiac pacemaker 2017  . PTSD (post-traumatic stress disorder)   . PTSD (post-traumatic stress disorder)   . Pure hypercholesterolemia   . Rheumatic fever 1959  . 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 TIA (transient ischemic attack) 11/02/2015    Past Surgical History:  Procedure Laterality Date  . ABDOMINAL HERNIA REPAIR    . APPENDECTOMY  06/21/1985  .  CARDIAC CATHETERIZATION  2010; 2011   ; Dr Fletcher Anon  . CORONARY ARTERY BYPASS GRAFT  04/2009   "CABG X3"  . EP IMPLANTABLE DEVICE N/A 03/03/2016   Procedure: Pacemaker Implant;  Surgeon: Deboraha Sprang, MD;  Location: [facility];  Service: Cardiovascular;  Laterality: N/A;  . FOREIGN BODY REMOVAL  1968   "shrapnel in my tailbone"  . INGUINAL HERNIA REPAIR Right   . INSERT / REPLACE / REMOVE PACEMAKER    . JOINT REPLACEMENT Right 2018  . LAPAROSCOPIC CHOLECYSTECTOMY      . TONSILLECTOMY AND ADENOIDECTOMY  1956  . TOTAL HIP ARTHROPLASTY Right 10/22/2016   Procedure: TOTAL HIP ARTHROPLASTY;  Surgeon: Dereck Leep, MD;  Location: [facility];  Service: Orthopedics;  Laterality: Right;  . TOTAL HIP ARTHROPLASTY Left 11/04/2017   Procedure: TOTAL HIP ARTHROPLASTY;  Surgeon: Dereck Leep, MD;  Location: [facility];  Service: Orthopedics;  Laterality: Left;    Family History  Problem Relation Age of Onset  . Heart disease Mother   . Heart attack Mother   . Coronary artery disease Other        family history    SOCIAL HX: Former smoker   Current Outpatient Medications:  .  acetaminophen (TYLENOL) 500 MG tablet, Take 1,000 mg by mouth every 8 (eight) hours as needed for mild pain., Disp: , Rfl:  .  acyclovir (ZOVIRAX) 400 MG tablet, Take 1 tablet (400 mg total) by mouth 2 (two) times daily., Disp: 60 tablet, Rfl: 3 .  albuterol (PROVENTIL HFA;VENTOLIN HFA) 108 (90 Base) MCG/ACT inhaler, Inhale 2 puffs into the lungs every 6 (six) hours as needed for wheezing or shortness of breath., Disp: 1 Inhaler, Rfl: 11 .  allopurinol (ZYLOPRIM) 300 MG tablet, Take 1 tablet by mouth twice daily, Disp: 120 tablet, Rfl: 3 .  amiodarone (PACERONE) 200 MG tablet, Take 1/2 (one-half) tablet by mouth twice daily, Disp: 90 tablet, Rfl: 0 .  apixaban (ELIQUIS) 5 MG TABS tablet, Take 5 mg by mouth 2 (two) times daily., Disp: , Rfl:  .  budesonide-formoterol (SYMBICORT) 160-4.5 MCG/ACT inhaler, Inhale 2 puffs into the lungs 2 (two) times daily., Disp: 1 Inhaler, Rfl: 11 .  cetirizine (ZYRTEC) 10 MG tablet, Take 10 mg by mouth daily as needed for allergies. , Disp: , Rfl:  .  Coenzyme Q10 (COQ10) 200 MG CAPS, Take 200 mg by mouth daily., Disp: , Rfl:  .  dexamethasone (DECADRON) 4 MG tablet, Start 2 days prior to infusion; Take for 2 days. Do not take on the day of infusion., Disp: 60 tablet, Rfl: 3 .  ezetimibe (ZETIA) 10 MG tablet, Take 1 tablet (10 mg total) by mouth daily., Disp: 90  tablet, Rfl: 3 .  furosemide (LASIX) 20 MG tablet, Take 1 tablet (20 mg) by mouth twice daily as needed, Disp: , Rfl:  .  Krill Oil 350 MG CAPS, Take 350 mg by mouth as needed. , Disp: , Rfl:  .  metoprolol tartrate (LOPRESSOR) 25 MG tablet, Take 25 mg by mouth 2 (two) times daily., Disp: , Rfl:  .  mirtazapine (REMERON) 15 MG tablet, Take 15 mg by mouth at bedtime as needed (for panic associated with PTSD). , Disp: , Rfl:  .  Multiple Vitamin (MULTIVITAMIN WITH MINERALS) TABS tablet, Take 1 tablet by mouth daily., Disp: , Rfl:  .  mupirocin ointment (BACTROBAN) 2 %, Apply 1 application topically 3 (three) times daily., Disp: 30 g, Rfl: 1 .  nitroGLYCERIN (NITROSTAT)  0.4 MG SL tablet, Place 1 tablet (0.4 mg total) under the tongue every 5 (five) minutes as needed for chest pain., Disp: 25 tablet, Rfl: 6 .  tiotropium (SPIRIVA) 18 MCG inhalation capsule, Place 1 capsule (18 mcg total) into inhaler and inhale at bedtime., Disp: 30 capsule, Rfl: 11 .  traMADol (ULTRAM) 50 MG tablet, TAKE 1 TABLET BY MOUTH EVERY 12 HOURS AS NEEDED FOR MODERATE PAIN, Disp: 90 tablet, Rfl: 0 .  venetoclax (VENCLEXTA) 100 MG TABS, Take 200 mg by mouth daily. (Take 2 tablets daily), Disp: 60 tablet, Rfl: 6 .  montelukast (SINGULAIR) 10 MG tablet, Take 1 tablet (10 mg total) by mouth at bedtime. Start 2 days prior to infusion. Take it for 4 days., Disp: 60 tablet, Rfl: 0  EXAM: This was a telehealth telephone visit and thus no physical exam was completed.  ASSESSMENT AND PLAN:  Discussed the following assessment and plan:  Paroxysmal atrial fibrillation (HCC) Asymptomatic.  Current medications managed through cardiology.  Sick sinus syndrome [facility]) Managed by cardiology.  COPD (chronic obstructive pulmonary disease) Stable.  Continue current regimen.  Arthritis Stable.  He will continue to monitor.  Hyperlipidemia No longer on medication.  We will see if we can arrange for lipid panel with his next labs at the  cancer center.   No orders of the defined types were placed in this encounter.   No orders of the defined types were placed in this encounter.    I discussed the assessment and treatment plan with the patient. The patient was provided an opportunity to ask questions and all were answered. The patient agreed with the plan and demonstrated an understanding of the instructions.   The patient was advised to call back or seek an in-person evaluation if the symptoms worsen or if the condition fails to improve as anticipated.  I provided 12 minutes of non-face-to-face time during this encounter.   [name], MD

## 2019-09-16 NOTE — Assessment & Plan Note (Signed)
Stable.  He will continue to monitor. 

## 2019-09-16 NOTE — Telephone Encounter (Signed)
Dr Caryl Bis requesting that on patient next lab draw 09/29/19 if we would draw a lipid panel on this patient for him. Please advise

## 2019-09-16 NOTE — Assessment & Plan Note (Signed)
Stable  Continue current regimen  

## 2019-09-16 NOTE — Assessment & Plan Note (Signed)
No longer on medication.  We will see if we can arrange for lipid panel with his next labs at the cancer center.

## 2019-09-16 NOTE — Assessment & Plan Note (Signed)
Managed by cardiology 

## 2019-09-16 NOTE — Assessment & Plan Note (Signed)
Asymptomatic.  Current medications managed through cardiology.

## 2019-09-16 NOTE — Progress Notes (Signed)
I called [facility] and spoke with the triage nurse and they will order.  Lejuan Botto,cma

## 2019-09-19 NOTE — Telephone Encounter (Signed)
Spoke with Dr. Rogue Bussing. V/o to add lipid panel to next apt. Hassan Rowan if you can let patient know. I would appreciate this. Thanks. Jonne Ply, RN

## 2019-09-19 NOTE — Telephone Encounter (Signed)
Gae Bon at Dr Tharon Aquas office informed that Dr B agreed to order the Lipid panel for 09/29/19

## 2019-09-21 ENCOUNTER — Other Ambulatory Visit: Payer: Self-pay | Admitting: Internal Medicine

## 2019-09-22 ENCOUNTER — Telehealth: Payer: Self-pay | Admitting: *Deleted

## 2019-09-22 NOTE — Telephone Encounter (Signed)
Patient called wanting to know why Dr B is not refilling his Allopurinol. Please advise

## 2019-09-22 NOTE — Telephone Encounter (Signed)
Spoke with patient. He gave verbal understanding of the discontinuation of allopurinol.

## 2019-09-22 NOTE — Telephone Encounter (Signed)
Please advise 

## 2019-09-22 NOTE — Telephone Encounter (Signed)
T/H- please inform pt that he was started on allopurinol to prevent gout flare up. Since his lymphoma disease is in good control; he does not need it. I can talk to him at next visit if he still has questions. GB

## 2019-09-28 ENCOUNTER — Other Ambulatory Visit: Payer: Self-pay

## 2019-09-29 ENCOUNTER — Inpatient Hospital Stay: Payer: Medicare HMO | Attending: Internal Medicine

## 2019-09-29 ENCOUNTER — Inpatient Hospital Stay (HOSPITAL_BASED_OUTPATIENT_CLINIC_OR_DEPARTMENT_OTHER): Payer: Medicare HMO | Admitting: Internal Medicine

## 2019-09-29 DIAGNOSIS — R197 Diarrhea, unspecified: Secondary | ICD-10-CM | POA: Diagnosis not present

## 2019-09-29 DIAGNOSIS — Z87891 Personal history of nicotine dependence: Secondary | ICD-10-CM | POA: Diagnosis not present

## 2019-09-29 DIAGNOSIS — M791 Myalgia, unspecified site: Secondary | ICD-10-CM | POA: Insufficient documentation

## 2019-09-29 DIAGNOSIS — C9110 Chronic lymphocytic leukemia of B-cell type not having achieved remission: Secondary | ICD-10-CM | POA: Diagnosis present

## 2019-09-29 DIAGNOSIS — I4891 Unspecified atrial fibrillation: Secondary | ICD-10-CM | POA: Insufficient documentation

## 2019-09-29 DIAGNOSIS — R5383 Other fatigue: Secondary | ICD-10-CM | POA: Diagnosis not present

## 2019-09-29 DIAGNOSIS — R252 Cramp and spasm: Secondary | ICD-10-CM | POA: Diagnosis not present

## 2019-09-29 DIAGNOSIS — Z7901 Long term (current) use of anticoagulants: Secondary | ICD-10-CM | POA: Diagnosis not present

## 2019-09-29 DIAGNOSIS — Z13220 Encounter for screening for lipoid disorders: Secondary | ICD-10-CM

## 2019-09-29 LAB — COMPREHENSIVE METABOLIC PANEL
ALT: 33 U/L (ref 0–44)
AST: 22 U/L (ref 15–41)
Albumin: 3.9 g/dL (ref 3.5–5.0)
Alkaline Phosphatase: 87 U/L (ref 38–126)
Anion gap: 8 (ref 5–15)
BUN: 20 mg/dL (ref 8–23)
CO2: 24 mmol/L (ref 22–32)
Calcium: 8.6 mg/dL — ABNORMAL LOW (ref 8.9–10.3)
Chloride: 108 mmol/L (ref 98–111)
Creatinine, Ser: 0.86 mg/dL (ref 0.61–1.24)
GFR calc Af Amer: >60 mL/min (ref >60)
GFR calc non Af Amer: >60 mL/min (ref >60)
Glucose, Bld: 142 mg/dL — ABNORMAL HIGH (ref 70–99)
Potassium: 3.9 mmol/L (ref 3.5–5.1)
Sodium: 140 mmol/L (ref 135–145)
Total Bilirubin: 0.8 mg/dL (ref 0.3–1.2)
Total Protein: 6.2 g/dL — ABNORMAL LOW (ref 6.5–8.1)

## 2019-09-29 LAB — CBC WITH DIFFERENTIAL/PLATELET
Abs Immature Granulocytes: 0.03 10*3/uL (ref 0.00–0.07)
Basophils Absolute: 0 10*3/uL (ref 0.0–0.1)
Basophils Relative: 0 %
Eosinophils Absolute: 0 10*3/uL (ref 0.0–0.5)
Eosinophils Relative: 0 %
HCT: 38.1 % — ABNORMAL LOW (ref 39.0–52.0)
Hemoglobin: 12.6 g/dL — ABNORMAL LOW (ref 13.0–17.0)
Immature Granulocytes: 1 %
Lymphocytes Relative: 28 %
Lymphs Abs: 1.5 10*3/uL (ref 0.7–4.0)
MCH: 33.6 pg (ref 26.0–34.0)
MCHC: 33.1 g/dL (ref 30.0–36.0)
MCV: 101.6 fL — ABNORMAL HIGH (ref 80.0–100.0)
Monocytes Absolute: 0.8 10*3/uL (ref 0.1–1.0)
Monocytes Relative: 16 %
Neutro Abs: 2.9 10*3/uL (ref 1.7–7.7)
Neutrophils Relative %: 55 %
Platelets: 130 10*3/uL — ABNORMAL LOW (ref 150–400)
RBC: 3.75 MIL/uL — ABNORMAL LOW (ref 4.22–5.81)
RDW: 14.2 % (ref 11.5–15.5)
WBC: 5.3 10*3/uL (ref 4.0–10.5)
nRBC: 0 % (ref 0.0–0.2)

## 2019-09-29 LAB — LIPID PANEL
Cholesterol: 264 mg/dL — ABNORMAL HIGH (ref 0–200)
HDL: 49 mg/dL (ref >40)
LDL Cholesterol: 172 mg/dL — ABNORMAL HIGH (ref 0–99)
Total CHOL/HDL Ratio: 5.4 RATIO
Triglycerides: 213 mg/dL — ABNORMAL HIGH (ref <150)
VLDL: 43 mg/dL — ABNORMAL HIGH (ref 0–40)

## 2019-09-29 LAB — LACTATE DEHYDROGENASE: LDH: 141 U/L (ref 98–192)

## 2019-09-29 NOTE — Patient Instructions (Signed)
#  Stop venetoclax for 1 week; update as regarding diarrhea.   #Can start taking venetoclax if diarrhea is improved.

## 2019-09-29 NOTE — Assessment & Plan Note (Addendum)
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  Stable.  FEB 2021- CT- continued PR.   # Currently on venatoclax  200 mg/day [half the dose-DI with amiodarone]. Labs today reviewed;  Acceptable.see below regarding diarrhea/  # Worsening fatigue/muscle pain in legs-stable not improving? Venatoclax; CK-N/aldolase-n see below regarding diarrhea.  #Diarrhea for 10 days; question venetoclax.  Recommend holding venetoclax for 7 days-resolution of diarrhea.  Inform us regarding the status of diarrhea.  # A.fib-on amiodarone [drug interaction-amiodarone] sinus rhythm-on Eliquis-stable  DISPOSITION:  # follow up in 66month MD; labs- cbc/cmp/ldh;-Dr.B

## 2019-09-29 NOTE — Progress Notes (Signed)
[name]  Patient Care Team: Leone Haven, MD as PCP - General (Family Medicine) [name], MD as Consulting Physician (Cardiology) Cammie Sickle, MD as Medical Oncologist (Hematology and Oncology)  Cancer Staging No matching staging information was found for the patient.   Oncology History Overview Doyle  # AUG 2015- SLL/CLL [Right Ax Ln Bx] s/p Benda-Rituxan x6 [finished March 2016]; Maintenance Rituxan q 21m[started April 2016; Dr.Pandit];Last Ritux Jan 2017.  MARCH 2017- CT N/C/A/P- NED. STOP Ritux; surveillance   # AUG 2019- CT/PET- progression/NO transformation;  # NOV 2019- Progression; started Ibrutinib 420 mg/d. STOPPED in end of feb sec to extreme fatigue/joint pains/cramps  #November 01, 2018-start ibrutinib 280 mg a day; December 20, 2018-discontinue ibrutinib secondary multiple side effects.   #January 03, 2019 start [place][July]; finished Dec 2020-; started VLawson Fiscalmaintenance [200 mg a day; DI-amiodarone]    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; IGVH- UN-MUTATED [poor prognosis]  SURVIVORSHIP: p  DIAGNOSIS: CLL   STAGE: IV  ;GOALS: control  CURRENT/MOST RECENT THERAPY : venetoclax     CLL (chronic lymphocytic leukemia) ([facility]  01/03/2019 -  Chemotherapy   The patient had obinutuzumab (GAZYVA) 100 mg in sodium chloride 0.9 % 100 mL (0.9615 mg/mL) chemo infusion, 100 mg, Intravenous, Once, 6 of 6 cycles Administration: 100 mg (01/03/2019), 900 mg (01/04/2019), 1,000 mg (01/10/2019), 1,000 mg (01/31/2019), 1,000 mg (01/17/2019), 1,000 mg (02/28/2019), 1,000 mg (04/07/2019), 1,000 mg (05/05/2019), 1,000 mg (06/02/2019)  for chemotherapy treatment.      INTERVAL HISTORY:  Michael [id number].  male CLL-high risk currently s/p Gazyvax 6 cycles; on  venetoclax  taking 200 mg a day [drug interaction with amiodarone]-is here for follow-up.  Patient complains of diarrhea 2-3 loose stools a day for the last 10 days.  Improved on taking morning.  Not resolving.  Continues to complain of cramping in the legs.  Complains of myalgias.  Denies any worsening shortness of breath or cough.  No chest pain.  No new lumps or bumps.  Chronic arthritic pain.   Review of Systems  Constitutional: Positive for malaise/fatigue. Negative for chills, diaphoresis and fever.  HENT: Negative for nosebleeds and sore throat.   Eyes: Negative for double vision.  Respiratory: Negative for cough, hemoptysis, sputum production, shortness of breath and wheezing.   Cardiovascular: Negative for chest pain, palpitations, orthopnea and leg swelling.  Gastrointestinal: Positive for diarrhea. Negative for abdominal pain, blood in stool, constipation, heartburn, melena, nausea and vomiting.  Genitourinary: Negative for dysuria, frequency and urgency.  Musculoskeletal: Positive for back pain, joint pain and myalgias.  Skin: Negative.  Negative for itching and rash.  Neurological: Negative for dizziness, tingling, focal weakness and headaches.  Psychiatric/Behavioral: Negative for depression. The patient is not nervous/anxious and does not have insomnia.       PAST MEDICAL HISTORY :  Past Medical History:  Diagnosis Date  . Anxiety   . Arthritis   . [place]rial fibrillation ([facility]    a. Dx 2013, recurred 02/2014, CHA2DS2VASc = 3 -->placed on Eliquis;  b. 02/2014 Echo: EF 50-55%, mid and apical anterior septum and mid and apical inf septum are abnl, mild to mod Ao sclerosis w/o AS.  .Marland KitchenChicken pox   . Chronic lymphocytic leukemia ([facility]    a.  Dx 02/2014.  Marland Kitchen Complication of anesthesia    History of  PTSD--do not touch patient when waking up from surgery.  Marland Kitchen COPD (chronic obstructive pulmonary disease) ([place])   . Coronary artery disease    a. 04/2009 CABG x 3 (LIMA->LAD, VG->OM1,  VG->PDA);  b. 09/2009 Cath: occluded VG x 2 w/ patent LIMA and L->R collats. EF 55%, mild antlat HK;  c. 10/2011 MV: EF 53%, no isch/infarct-->low risk.  Marland Kitchen Dysrhythmia    hx of a-fib  . GERD (gastroesophageal reflux disease)    occasional  . History of chemotherapy 2015-2016  . HOH (hard of hearing)    Bilateral Hearing Aids  . Hypertension   . Myocardial infarction ([place]) 2010  . OSA on CPAP    USE C-PAP  . Presence of permanent cardiac pacemaker 2017  . PTSD (post-traumatic stress disorder)   . PTSD (post-traumatic stress disorder)   . Pure hypercholesterolemia   . Rheumatic fever 1959  . 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 TIA (transient ischemic attack) 11/02/2015    PAST SURGICAL HISTORY :   Past Surgical History:  Procedure Laterality Date  . ABDOMINAL HERNIA REPAIR    . APPENDECTOMY  06/21/1985  . CARDIAC CATHETERIZATION  2010; 2011   ; Dr Fletcher Anon  . CORONARY ARTERY BYPASS GRAFT  04/2009   "CABG X3"  . EP IMPLANTABLE DEVICE N/A 03/03/2016   Procedure: Pacemaker Implant;  Surgeon: Deboraha Sprang, MD;  Location: [facility];  Service: Cardiovascular;  Laterality: N/A;  . FOREIGN BODY REMOVAL  1968   "shrapnel in my tailbone"  . INGUINAL HERNIA REPAIR Right   . INSERT / REPLACE / REMOVE PACEMAKER    . JOINT REPLACEMENT Right 2018  . LAPAROSCOPIC CHOLECYSTECTOMY    . TONSILLECTOMY AND ADENOIDECTOMY  1956  . TOTAL HIP ARTHROPLASTY Right 10/22/2016   Procedure: TOTAL HIP ARTHROPLASTY;  Surgeon: Dereck Leep, MD;  Location: [facility];  Service: Orthopedics;  Laterality: Right;  . TOTAL HIP ARTHROPLASTY Left 11/04/2017   Procedure: TOTAL HIP ARTHROPLASTY;  Surgeon: Dereck Leep, MD;  Location: [facility];  Service: Orthopedics;  Laterality: Left;    FAMILY HISTORY :   Family History  Problem Relation Age of Onset  . Heart disease Mother   . Heart attack Mother   . Coronary artery disease Other        family history    SOCIAL HISTORY:   Social  History   Tobacco Use  . Smoking status: Former Smoker    Packs/day: 1.00    Years: 40.00    Pack years: 40.00    Types: Cigarettes    Quit date: 07/21/2006    Years since quitting: 13.2  . Smokeless tobacco: Never Used  Substance Use Topics  . Alcohol use: Not Currently  . Drug use: No    ALLERGIES:  has No Known Allergies.  MEDICATIONS:  Current Outpatient Medications  Medication Sig Dispense Refill  . acetaminophen (TYLENOL) 500 MG tablet Take 1,000 mg by mouth every 8 (eight) hours as needed for mild pain.    Marland Kitchen acyclovir (ZOVIRAX) 400 MG tablet Take 1 tablet (400 mg total) by mouth 2 (two) times daily. 60 tablet 3  . albuterol (PROVENTIL HFA;VENTOLIN HFA) 108 (90 Base) MCG/ACT inhaler Inhale 2 puffs into the lungs every 6 (six) hours as needed for wheezing or shortness of breath. 1 Inhaler 11  . amiodarone (PACERONE) 200 MG tablet Take 1/2 (one-half) tablet by mouth twice  daily 90 tablet 0  . apixaban (ELIQUIS) 5 MG TABS tablet Take 5 mg by mouth 2 (two) times daily.    . budesonide-formoterol (SYMBICORT) 160-4.5 MCG/ACT inhaler Inhale 2 puffs into the lungs 2 (two) times daily. 1 Inhaler 11  . cetirizine (ZYRTEC) 10 MG tablet Take 10 mg by mouth daily as needed for allergies.     . Coenzyme Q10 (COQ10) 200 MG CAPS Take 200 mg by mouth daily.    Marland Kitchen dexamethasone (DECADRON) 4 MG tablet Start 2 days prior to infusion; Take for 2 days. Do not take on the day of infusion. 60 tablet 3  . ezetimibe (ZETIA) 10 MG tablet Take 1 tablet (10 mg total) by mouth daily. 90 tablet 3  . furosemide (LASIX) 20 MG tablet Take 1 tablet (20 mg) by mouth twice daily as needed    . Krill Oil 350 MG CAPS Take 350 mg by mouth as needed.     . metoprolol tartrate (LOPRESSOR) 25 MG tablet Take 25 mg by mouth 2 (two) times daily.    . mirtazapine (REMERON) 15 MG tablet Take 15 mg by mouth at bedtime as needed (for panic associated with PTSD).     Marland Kitchen montelukast (SINGULAIR) 10 MG tablet Take 1 tablet (10 mg  total) by mouth at bedtime. Start 2 days prior to infusion. Take it for 4 days. 60 tablet 0  . Multiple Vitamin (MULTIVITAMIN WITH MINERALS) TABS tablet Take 1 tablet by mouth daily.    . mupirocin ointment (BACTROBAN) 2 % Apply 1 application topically 3 (three) times daily. 30 g 1  . nitroGLYCERIN (NITROSTAT) 0.4 MG SL tablet Place 1 tablet (0.4 mg total) under the tongue every 5 (five) minutes as needed for chest pain. 25 tablet 6  . tiotropium (SPIRIVA) 18 MCG inhalation capsule Place 1 capsule (18 mcg total) into inhaler and inhale at bedtime. 30 capsule 11  . traMADol (ULTRAM) 50 MG tablet TAKE 1 TABLET BY MOUTH EVERY 12 HOURS AS NEEDED FOR MODERATE PAIN 90 tablet 0  . venetoclax (VENCLEXTA) 100 MG TABS Take 200 mg by mouth daily. (Take 2 tablets daily) 60 tablet 6   No current facility-administered medications for this visit.    PHYSICAL EXAMINATION: ECOG PERFORMANCE STATUS: 1 - Symptomatic but completely ambulatory  BP (!) 156/84 (BP Location: Left Arm, Patient Position: Sitting, Cuff Size: Normal)   Pulse 81   Wt 227 lb 8 oz (103.2 kg)   BMI 33.60 kg/m   Filed Weights   09/29/19 1016  Weight: 227 lb 8 oz (103.2 kg)    Physical Exam  Constitutional: He is oriented to person, place, and time and well-developed, well-nourished, and in no distress.  Patient is alone.  HENT:  Head: Normocephalic and atraumatic.  Mouth/Throat: Oropharynx is clear and moist. No oropharyngeal exudate.  Eyes: Pupils are equal, round, and reactive to light.  Cardiovascular: Normal rate and regular rhythm.  Pulmonary/Chest: No respiratory distress. He has no wheezes.  Abdominal: Soft. Bowel sounds are normal. He exhibits no distension and no mass. There is no abdominal tenderness. There is no rebound and no guarding.  Musculoskeletal:        General: No tenderness or edema. Normal range of motion.     Cervical back: Normal range of motion and neck supple.  Lymphadenopathy:  Resolved lymph nodes in  the neck underarms.  Neurological: He is alert and oriented to person, place, and time.  Skin: Skin is warm.  Psychiatric: Affect normal.  LABORATORY DATA:  I have reviewed the data as listed    Component Value Date/Time   NA 140 09/29/2019 0945   NA 139 10/11/2014 1800   K 3.9 09/29/2019 0945   K 3.3 (L) 10/11/2014 1800   CL 108 09/29/2019 0945   CL 106 10/11/2014 1800   CO2 24 09/29/2019 0945   CO2 27 10/11/2014 1800   GLUCOSE 142 (H) 09/29/2019 0945   GLUCOSE 107 (H) 10/11/2014 1800   BUN 20 09/29/2019 0945   BUN 15 10/11/2014 1800   CREATININE 0.86 09/29/2019 0945   CREATININE 0.89 10/11/2014 1800   CALCIUM 8.6 (L) 09/29/2019 0945   CALCIUM 8.8 (L) 10/11/2014 1800   PROT 6.2 (L) 09/29/2019 0945   PROT 6.7 05/18/2017 1048   PROT 6.4 (L) 10/11/2014 1800   ALBUMIN 3.9 09/29/2019 0945   ALBUMIN 4.3 05/18/2017 1048   ALBUMIN 4.1 10/11/2014 1800   AST 22 09/29/2019 0945   AST 23 10/11/2014 1800   ALT 33 09/29/2019 0945   ALT 22 10/11/2014 1800   ALKPHOS 87 09/29/2019 0945   ALKPHOS 61 10/11/2014 1800   BILITOT 0.8 09/29/2019 0945   BILITOT 0.7 05/18/2017 1048   BILITOT 0.9 10/11/2014 1800   GFRNONAA >60 09/29/2019 0945   GFRNONAA >60 10/11/2014 1800   GFRAA >60 09/29/2019 0945   GFRAA >60 10/11/2014 1800    No results found for: SPEP, UPEP  Lab Results  Component Value Date   WBC 5.3 09/29/2019   NEUTROABS 2.9 09/29/2019   HGB 12.6 (L) 09/29/2019   HCT 38.1 (L) 09/29/2019   MCV 101.6 (H) 09/29/2019   PLT 130 (L) 09/29/2019      Chemistry      Component Value Date/Time   NA 140 09/29/2019 0945   NA 139 10/11/2014 1800   K 3.9 09/29/2019 0945   K 3.3 (L) 10/11/2014 1800   CL 108 09/29/2019 0945   CL 106 10/11/2014 1800   CO2 24 09/29/2019 0945   CO2 27 10/11/2014 1800   BUN 20 09/29/2019 0945   BUN 15 10/11/2014 1800   CREATININE 0.86 09/29/2019 0945   CREATININE 0.89 10/11/2014 1800      Component Value Date/Time   CALCIUM 8.6 (L) 09/29/2019  0945   CALCIUM 8.8 (L) 10/11/2014 1800   ALKPHOS 87 09/29/2019 0945   ALKPHOS 61 10/11/2014 1800   AST 22 09/29/2019 0945   AST 23 10/11/2014 1800   ALT 33 09/29/2019 0945   ALT 22 10/11/2014 1800   BILITOT 0.8 09/29/2019 0945   BILITOT 0.7 05/18/2017 1048   BILITOT 0.9 10/11/2014 1800       RADIOGRAPHIC STUDIES: I have personally reviewed the radiological images as listed and agreed with the findings in the report. No results found.   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  Stable.  FEB 2021- CT- continued PR.   # Currently on venatoclax  200 mg/day [half the dose-DI with amiodarone]. Labs today reviewed;  Acceptable.see below regarding diarrhea/  # Worsening fatigue/muscle pain in legs-stable not improving? Venatoclax; CK-N/aldolase-n see below regarding diarrhea.  #Diarrhea for 10 days; question venetoclax.  Recommend holding venetoclax for 7 days-resolution of diarrhea.  Inform us regarding the status of diarrhea.  # A.fib-on amiodarone [drug interaction-amiodarone] sinus rhythm-on Eliquis-stable  DISPOSITION:  # follow up in 23month MD; labs- cbc/cmp/ldh;-Dr.B    No orders of the defined types were placed  in this encounter.  All questions were answered. The patient knows to call the clinic with any problems, questions or concerns.      Cammie Sickle, MD 10/02/2019 7:19 PM

## 2019-10-06 ENCOUNTER — Telehealth: Payer: Self-pay | Admitting: *Deleted

## 2019-10-06 ENCOUNTER — Telehealth: Payer: Self-pay | Admitting: Internal Medicine

## 2019-10-06 NOTE — Telephone Encounter (Signed)
Call returned to patient and advised to cut dose to 1 tablet once a day. Patient repeated this back to  me and said he will further discuss his dosing at his appointment on the 8th

## 2019-10-06 NOTE — Telephone Encounter (Signed)
[name]- please ask pt to take 1 pill a day [instead of 2]. Hopefully that will help the diarrhea.  GB

## 2019-10-06 NOTE — Telephone Encounter (Signed)
LVM for pt-given improvement of diarrhea of venetoclax.  Recommend 1 pill a day.  Follow-up as planned

## 2019-10-06 NOTE — Telephone Encounter (Signed)
Patient called stating that this is day 7 off his medicine due to the diarrhea. His diarrhea has greatly improved and he knows he needs to go back on the medicine because it is making his cancer go away, but he is asking to spread out his dosing from once a day to taking it twice a day. Please advise or return his call

## 2019-10-10 ENCOUNTER — Other Ambulatory Visit: Payer: Self-pay | Admitting: Cardiovascular Disease

## 2019-10-11 ENCOUNTER — Ambulatory Visit: Payer: Medicare HMO | Attending: Internal Medicine

## 2019-10-11 DIAGNOSIS — Z23 Encounter for immunization: Secondary | ICD-10-CM

## 2019-10-11 NOTE — Progress Notes (Signed)
   Covid-19 Vaccination Clinic  Name:  [name]    MRN: [id number] DOB: 26-Aug-1945  10/11/2019  [name] was observed post Covid-19 immunization for 15 minutes without incident. He was provided with Vaccine Information Sheet and instruction to access the V-Safe system.   [name] was instructed to call 911 with any severe reactions post vaccine: Marland Kitchen Difficulty breathing  . Swelling of face and throat  . A fast heartbeat  . A bad rash all over body  . Dizziness and weakness   Immunizations Administered    Name Date Dose VIS Date Route   Pfizer COVID-19 Vaccine 10/11/2019  9:12 AM 0.3 mL 07/01/2019 Intramuscular   Manufacturer: [place]   Lot: [id number]   [place]: [id number]

## 2019-10-27 ENCOUNTER — Inpatient Hospital Stay: Payer: Medicare HMO | Attending: Internal Medicine

## 2019-10-27 ENCOUNTER — Inpatient Hospital Stay (HOSPITAL_BASED_OUTPATIENT_CLINIC_OR_DEPARTMENT_OTHER): Payer: Medicare HMO | Admitting: Internal Medicine

## 2019-10-27 ENCOUNTER — Other Ambulatory Visit: Payer: Self-pay

## 2019-10-27 DIAGNOSIS — R197 Diarrhea, unspecified: Secondary | ICD-10-CM | POA: Insufficient documentation

## 2019-10-27 DIAGNOSIS — Z87891 Personal history of nicotine dependence: Secondary | ICD-10-CM | POA: Insufficient documentation

## 2019-10-27 DIAGNOSIS — I4891 Unspecified atrial fibrillation: Secondary | ICD-10-CM | POA: Diagnosis not present

## 2019-10-27 DIAGNOSIS — C9110 Chronic lymphocytic leukemia of B-cell type not having achieved remission: Secondary | ICD-10-CM | POA: Insufficient documentation

## 2019-10-27 DIAGNOSIS — Z7901 Long term (current) use of anticoagulants: Secondary | ICD-10-CM | POA: Insufficient documentation

## 2019-10-27 LAB — CBC WITH DIFFERENTIAL/PLATELET
Abs Immature Granulocytes: 0.03 K/uL (ref 0.00–0.07)
Basophils Absolute: 0 K/uL (ref 0.0–0.1)
Basophils Relative: 0 %
Eosinophils Absolute: 0 K/uL (ref 0.0–0.5)
Eosinophils Relative: 1 %
HCT: 40.2 % (ref 39.0–52.0)
Hemoglobin: 13.5 g/dL (ref 13.0–17.0)
Immature Granulocytes: 0 %
Lymphocytes Relative: 36 %
Lymphs Abs: 2.6 K/uL (ref 0.7–4.0)
MCH: 33.1 pg (ref 26.0–34.0)
MCHC: 33.6 g/dL (ref 30.0–36.0)
MCV: 98.5 fL (ref 80.0–100.0)
Monocytes Absolute: 1 K/uL (ref 0.1–1.0)
Monocytes Relative: 14 %
Neutro Abs: 3.5 K/uL (ref 1.7–7.7)
Neutrophils Relative %: 49 %
Platelets: 147 K/uL — ABNORMAL LOW (ref 150–400)
RBC: 4.08 MIL/uL — ABNORMAL LOW (ref 4.22–5.81)
RDW: 13.3 % (ref 11.5–15.5)
WBC: 7.2 K/uL (ref 4.0–10.5)
nRBC: 0 % (ref 0.0–0.2)

## 2019-10-27 LAB — COMPREHENSIVE METABOLIC PANEL
ALT: 35 U/L (ref 0–44)
AST: 24 U/L (ref 15–41)
Albumin: 4.3 g/dL (ref 3.5–5.0)
Alkaline Phosphatase: 91 U/L (ref 38–126)
Anion gap: 9 (ref 5–15)
BUN: 21 mg/dL (ref 8–23)
CO2: 27 mmol/L (ref 22–32)
Calcium: 8.9 mg/dL (ref 8.9–10.3)
Chloride: 107 mmol/L (ref 98–111)
Creatinine, Ser: 0.98 mg/dL (ref 0.61–1.24)
GFR calc Af Amer: >60 mL/min (ref >60)
GFR calc non Af Amer: >60 mL/min (ref >60)
Glucose, Bld: 128 mg/dL — ABNORMAL HIGH (ref 70–99)
Potassium: 4.2 mmol/L (ref 3.5–5.1)
Sodium: 143 mmol/L (ref 135–145)
Total Bilirubin: 1.3 mg/dL — ABNORMAL HIGH (ref 0.3–1.2)
Total Protein: 6.8 g/dL (ref 6.5–8.1)

## 2019-10-27 LAB — LACTATE DEHYDROGENASE: LDH: 168 U/L (ref 98–192)

## 2019-10-27 NOTE — Assessment & Plan Note (Addendum)
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  STABLE  FEB 2021- CT-continued partial response.  # Currently on Venoclax 100 mg [sec to diarrhea; DI with Amio]; continue at current dose.  We'll plan to get repeat imaging in August.  # Diarrhea- Sec to Ventolax- improved.  Monitor closely.  # A.fib-on amiodarone [drug interaction-amiodarone] sinus rhythm-on Eliquis- stable.   DISPOSITION:  # follow up in 5month MD; labs- cbc/cmp/ldh;-Dr.B

## 2019-10-27 NOTE — Progress Notes (Signed)
[place] OFFICE PROGRESS NOTE  Patient Care Team: Leone Haven, MD as PCP - General (Family Medicine) [name], MD as Consulting Physician (Cardiology) Cammie Sickle, MD as Medical Oncologist (Hematology and Oncology)  Cancer Staging No matching staging information was found for the patient.   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  MARCH 2017- CT N/C/A/P- NED. STOP Ritux; surveillance   # AUG 2019- CT/PET- progression/NO transformation;  # NOV 2019- Progression; started Ibrutinib 420 mg/d. STOPPED in end of feb sec to extreme fatigue/joint pains/cramps  #November 01, 2018-start ibrutinib 280 mg a day; December 20, 2018-discontinue ibrutinib secondary multiple side effects.   #January 03, 2019 start [facility][July]; finished Dec 2020-; [facility] [200 mg a day; DI-amiodarone]  #Mid March 2021-venetoclax dose reduced to 100 mg [second diarrhea].     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      INTERVAL HISTORY:  TCRISTOPHER CICCARELLI89y.[id number].  male CLL-high risk currently s/p Gazyvax 6 cycles; on venetoclax is here for follow-up.  At last visit patient was asked to stop taking venetoclax because of diarrhea.  Since the diarrhea resolved after a week-patient went back on venetoclax 100 mg a day.  Patient currently has 1 loose stool behavior today.  Overall diarrhea is improved.  Not needing to take Imodium.  Continues to have mild joint pains leg pains not any worse.  Review of Systems  Constitutional: Positive for malaise/fatigue. Negative for chills, diaphoresis and fever.  HENT: Negative for nosebleeds and sore throat.   Eyes: Negative for double vision.  Respiratory: Negative for cough, hemoptysis, sputum production, shortness of breath and wheezing.   Cardiovascular: Negative for chest pain, palpitations, orthopnea and leg swelling.  Gastrointestinal: Positive for diarrhea. Negative for abdominal pain, blood in stool, constipation, heartburn, melena, nausea and vomiting.  Genitourinary: Negative for dysuria, frequency and urgency.  Musculoskeletal: Positive for back pain, joint pain and myalgias.  Skin: Negative.  Negative for itching and rash.  Neurological: Negative for dizziness, tingling, focal weakness and headaches.  Psychiatric/Behavioral: Negative for depression. The patient is not nervous/anxious and does not have insomnia.       PAST MEDICAL HISTORY :  Past Medical History:  Diagnosis Date  . Anxiety   . Arthritis   . Atrial fibrillation ([place]    a. Dx 2013, recurred 02/2014, CHA2DS2VASc = 3 -->placed on Eliquis;  b. 02/2014 Echo: EF 50-55%, mid and apical anterior septum and mid and apical inf septum are abnl, mild to mod Ao sclerosis w/o AS.  .Marland KitchenChicken pox   . Chronic lymphocytic leukemia (  [place])    a. Dx 02/2014.  Marland Kitchen Complication of anesthesia    History of  PTSD--do not touch patient when waking up from surgery.  Marland Kitchen COPD (chronic obstructive pulmonary disease) ([place])   . Coronary artery  disease    a. 04/2009 CABG x 3 (LIMA->LAD, VG->OM1, VG->PDA);  b. 09/2009 Cath: occluded VG x 2 w/ patent LIMA and L->R collats. EF 55%, mild antlat HK;  c. 10/2011 MV: EF 53%, no isch/infarct-->low risk.  Marland Kitchen Dysrhythmia    hx of a-fib  . GERD (gastroesophageal reflux disease)    occasional  . History of chemotherapy 2015-2016  . HOH (hard of hearing)    Bilateral Hearing Aids  . Hypertension   . Myocardial infarction ([place]) 2010  . OSA on CPAP    USE C-PAP  . Presence of permanent cardiac pacemaker 2017  . PTSD (post-traumatic stress disorder)   . PTSD (post-traumatic stress disorder)   . Pure hypercholesterolemia   . Rheumatic fever 1959  . 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 TIA (transient ischemic attack) 11/02/2015    PAST SURGICAL HISTORY :   Past Surgical History:  Procedure Laterality Date  . ABDOMINAL HERNIA REPAIR    . APPENDECTOMY  06/21/1985  . CARDIAC CATHETERIZATION  2010; 2011   ; Dr Fletcher Anon  . CORONARY ARTERY BYPASS GRAFT  04/2009   "CABG X3"  . EP IMPLANTABLE DEVICE N/A 03/03/2016   Procedure: Pacemaker Implant;  Surgeon: Deboraha Sprang, MD;  Location: [facility];  Service: Cardiovascular;  Laterality: N/A;  . FOREIGN BODY REMOVAL  1968   "shrapnel in my tailbone"  . INGUINAL HERNIA REPAIR Right   . INSERT / REPLACE / REMOVE PACEMAKER    . JOINT REPLACEMENT Right 2018  . LAPAROSCOPIC CHOLECYSTECTOMY    . TONSILLECTOMY AND ADENOIDECTOMY  1956  . TOTAL HIP ARTHROPLASTY Right 10/22/2016   Procedure: TOTAL HIP ARTHROPLASTY;  Surgeon: Dereck Leep, MD;  Location: [facility];  Service: Orthopedics;  Laterality: Right;  . TOTAL HIP ARTHROPLASTY Left 11/04/2017   Procedure: TOTAL HIP ARTHROPLASTY;  Surgeon: Dereck Leep, MD;  Location: [facility];  Service: Orthopedics;  Laterality: Left;    FAMILY HISTORY :   Family History  Problem Relation Age of Onset  . Heart disease Mother   . Heart attack Mother   . Coronary artery disease Other         family history    SOCIAL HISTORY:   Social History   Tobacco Use  . Smoking status: Former Smoker    Packs/day: 1.00    Years: 40.00    Pack years: 40.00    Types: Cigarettes    Quit date: 07/21/2006    Years since quitting: 13.2  . Smokeless tobacco: Never Used  Substance Use Topics  . Alcohol use: Not Currently  . Drug use: No    ALLERGIES:  has No Known Allergies.  MEDICATIONS:  Current Outpatient Medications  Medication Sig Dispense Refill  . acetaminophen (TYLENOL) 500 MG tablet Take 1,000 mg by mouth every 8 (eight) hours as needed for mild pain.    Marland Kitchen acyclovir (ZOVIRAX) 400 MG tablet Take 1 tablet (400 mg total) by mouth 2 (two) times daily. 60 tablet 3  . albuterol (PROVENTIL HFA;VENTOLIN HFA) 108 (90 Base) MCG/ACT inhaler Inhale 2 puffs into the lungs every 6 (six) hours as needed for wheezing or shortness of breath. 1 Inhaler 11  . amiodarone (PACERONE) 200 MG tablet Take 1/2 (  one-half) tablet by mouth twice daily 90 tablet 0  . apixaban (ELIQUIS) 5 MG TABS tablet Take 5 mg by mouth 2 (two) times daily.    . budesonide-formoterol (SYMBICORT) 160-4.5 MCG/ACT inhaler Inhale 2 puffs into the lungs 2 (two) times daily. 1 Inhaler 11  . cetirizine (ZYRTEC) 10 MG tablet Take 10 mg by mouth daily as needed for allergies.     . Coenzyme Q10 (COQ10) 200 MG CAPS Take 200 mg by mouth daily.    Marland Kitchen dexamethasone (DECADRON) 4 MG tablet Start 2 days prior to infusion; Take for 2 days. Do not take on the day of infusion. 60 tablet 3  . ezetimibe (ZETIA) 10 MG tablet Take 1 tablet (10 mg total) by mouth daily. 90 tablet 3  . furosemide (LASIX) 20 MG tablet Take 1 tablet (20 mg) by mouth twice daily as needed    . Krill Oil 350 MG CAPS Take 350 mg by mouth as needed.     . metoprolol tartrate (LOPRESSOR) 25 MG tablet Take 25 mg by mouth 2 (two) times daily.    . mirtazapine (REMERON) 15 MG tablet Take 15 mg by mouth at bedtime as needed (for panic associated with PTSD).     Marland Kitchen  montelukast (SINGULAIR) 10 MG tablet Take 1 tablet (10 mg total) by mouth at bedtime. Start 2 days prior to infusion. Take it for 4 days. 60 tablet 0  . Multiple Vitamin (MULTIVITAMIN WITH MINERALS) TABS tablet Take 1 tablet by mouth daily.    . mupirocin ointment (BACTROBAN) 2 % Apply 1 application topically 3 (three) times daily. 30 g 1  . nitroGLYCERIN (NITROSTAT) 0.4 MG SL tablet Place 1 tablet (0.4 mg total) under the tongue every 5 (five) minutes as needed for chest pain. 25 tablet 6  . tiotropium (SPIRIVA) 18 MCG inhalation capsule Place 1 capsule (18 mcg total) into inhaler and inhale at bedtime. 30 capsule 11  . traMADol (ULTRAM) 50 MG tablet TAKE 1 TABLET BY MOUTH EVERY 12 HOURS AS NEEDED FOR MODERATE PAIN 90 tablet 0  . venetoclax (VENCLEXTA) 100 MG TABS Take 200 mg by mouth daily. (Take 2 tablets daily) 60 tablet 6   No current facility-administered medications for this visit.    PHYSICAL EXAMINATION: ECOG PERFORMANCE STATUS: 1 - Symptomatic but completely ambulatory  BP (!) 148/86 (BP Location: Left Arm, Patient Position: Sitting, Cuff Size: Large)   Pulse 63   Temp (!) 97.2 F (36.2 C) (Tympanic)   Wt 220 lb 8 oz (100 kg)   BMI 32.56 kg/m   Filed Weights   10/27/19 1046  Weight: 220 lb 8 oz (100 kg)    Physical Exam  Constitutional: He is oriented to person, place, and time and well-developed, well-nourished, and in no distress.  Patient is alone.  HENT:  Head: Normocephalic and atraumatic.  Mouth/Throat: Oropharynx is clear and moist. No oropharyngeal exudate.  Eyes: Pupils are equal, round, and reactive to light.  Cardiovascular: Normal rate and regular rhythm.  Pulmonary/Chest: No respiratory distress. He has no wheezes.  Abdominal: Soft. Bowel sounds are normal. He exhibits no distension and no mass. There is no abdominal tenderness. There is no rebound and no guarding.  Musculoskeletal:        General: No tenderness or edema. Normal range of motion.      Cervical back: Normal range of motion and neck supple.  Lymphadenopathy:  Resolved lymph nodes in the neck underarms.  Neurological: He is alert and oriented to person, place,  and time.  Skin: Skin is warm.  Psychiatric: Affect normal.    LABORATORY DATA:  I have reviewed the data as listed    Component Value Date/Time   NA 143 10/27/2019 1032   NA 139 10/11/2014 1800   K 4.2 10/27/2019 1032   K 3.3 (L) 10/11/2014 1800   CL 107 10/27/2019 1032   CL 106 10/11/2014 1800   CO2 27 10/27/2019 1032   CO2 27 10/11/2014 1800   GLUCOSE 128 (H) 10/27/2019 1032   GLUCOSE 107 (H) 10/11/2014 1800   BUN 21 10/27/2019 1032   BUN 15 10/11/2014 1800   CREATININE 0.98 10/27/2019 1032   CREATININE 0.89 10/11/2014 1800   CALCIUM 8.9 10/27/2019 1032   CALCIUM 8.8 (L) 10/11/2014 1800   PROT 6.8 10/27/2019 1032   PROT 6.7 05/18/2017 1048   PROT 6.4 (L) 10/11/2014 1800   ALBUMIN 4.3 10/27/2019 1032   ALBUMIN 4.3 05/18/2017 1048   ALBUMIN 4.1 10/11/2014 1800   AST 24 10/27/2019 1032   AST 23 10/11/2014 1800   ALT 35 10/27/2019 1032   ALT 22 10/11/2014 1800   ALKPHOS 91 10/27/2019 1032   ALKPHOS 61 10/11/2014 1800   BILITOT 1.3 (H) 10/27/2019 1032   BILITOT 0.7 05/18/2017 1048   BILITOT 0.9 10/11/2014 1800   GFRNONAA >60 10/27/2019 1032   GFRNONAA >60 10/11/2014 1800   GFRAA >60 10/27/2019 1032   GFRAA >60 10/11/2014 1800    No results found for: SPEP, UPEP  Lab Results  Component Value Date   WBC 7.2 10/27/2019   NEUTROABS 3.5 10/27/2019   HGB 13.5 10/27/2019   HCT 40.2 10/27/2019   MCV 98.5 10/27/2019   PLT 147 (L) 10/27/2019      Chemistry      Component Value Date/Time   NA 143 10/27/2019 1032   NA 139 10/11/2014 1800   K 4.2 10/27/2019 1032   K 3.3 (L) 10/11/2014 1800   CL 107 10/27/2019 1032   CL 106 10/11/2014 1800   CO2 27 10/27/2019 1032   CO2 27 10/11/2014 1800   BUN 21 10/27/2019 1032   BUN 15 10/11/2014 1800   CREATININE 0.98 10/27/2019 1032   CREATININE  0.89 10/11/2014 1800      Component Value Date/Time   CALCIUM 8.9 10/27/2019 1032   CALCIUM 8.8 (L) 10/11/2014 1800   ALKPHOS 91 10/27/2019 1032   ALKPHOS 61 10/11/2014 1800   AST 24 10/27/2019 1032   AST 23 10/11/2014 1800   ALT 35 10/27/2019 1032   ALT 22 10/11/2014 1800   BILITOT 1.3 (H) 10/27/2019 1032   BILITOT 0.7 05/18/2017 1048   BILITOT 0.9 10/11/2014 1800       RADIOGRAPHIC STUDIES: I have personally reviewed the radiological images as listed and agreed with the findings in the report. No results found.   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  STABLE  FEB 2021- CT-continued partial response.  # Currently on Venoclax 100 mg [sec to diarrhea; DI with Amio]; continue at current dose.  We'll plan to get repeat imaging in August.  # Diarrhea- Sec to Ventolax- improved.  Monitor closely.  # A.fib-on amiodarone [drug interaction-amiodarone] sinus rhythm-on Eliquis- stable.   DISPOSITION:  # follow up in 28month MD; labs- cbc/cmp/ldh;-Dr.B    No orders of the defined types were placed in this encounter.  All questions were answered. The patient knows  to call the clinic with any problems, questions or concerns.      Cammie Sickle, MD 10/27/2019 12:25 PM

## 2019-11-14 ENCOUNTER — Other Ambulatory Visit: Payer: Self-pay | Admitting: *Deleted

## 2019-11-14 MED ORDER — VENCLEXTA 100 MG PO TABS: 200.0000 mg | ORAL_TABLET | Freq: Every day | ORAL | 6 refills | Status: DC

## 2019-11-16 MED FILL — VENCLEXTA 100 MG TABS: 100 | 30 days supply | Qty: 30 | Fill #0

## 2019-11-21 ENCOUNTER — Other Ambulatory Visit: Payer: Self-pay | Admitting: *Deleted

## 2019-11-23 MED ORDER — ACYCLOVIR 400 MG PO TABS: 400.0000 mg | ORAL_TABLET | Freq: Two times a day (BID) | ORAL | 3 refills | Status: DC

## 2019-11-23 NOTE — Telephone Encounter (Signed)
Please advise 

## 2019-11-24 ENCOUNTER — Other Ambulatory Visit: Payer: Self-pay

## 2019-11-24 ENCOUNTER — Inpatient Hospital Stay: Payer: Medicare HMO | Attending: Internal Medicine

## 2019-11-24 ENCOUNTER — Inpatient Hospital Stay (HOSPITAL_BASED_OUTPATIENT_CLINIC_OR_DEPARTMENT_OTHER): Payer: Medicare HMO | Admitting: Internal Medicine

## 2019-11-24 DIAGNOSIS — I4891 Unspecified atrial fibrillation: Secondary | ICD-10-CM | POA: Diagnosis not present

## 2019-11-24 DIAGNOSIS — R197 Diarrhea, unspecified: Secondary | ICD-10-CM | POA: Insufficient documentation

## 2019-11-24 DIAGNOSIS — M2550 Pain in unspecified joint: Secondary | ICD-10-CM | POA: Diagnosis not present

## 2019-11-24 DIAGNOSIS — M545 Low back pain: Secondary | ICD-10-CM | POA: Insufficient documentation

## 2019-11-24 DIAGNOSIS — Z7901 Long term (current) use of anticoagulants: Secondary | ICD-10-CM | POA: Insufficient documentation

## 2019-11-24 DIAGNOSIS — C9110 Chronic lymphocytic leukemia of B-cell type not having achieved remission: Secondary | ICD-10-CM | POA: Insufficient documentation

## 2019-11-24 DIAGNOSIS — Z87891 Personal history of nicotine dependence: Secondary | ICD-10-CM | POA: Insufficient documentation

## 2019-11-24 LAB — CBC WITH DIFFERENTIAL/PLATELET
Abs Immature Granulocytes: 0.05 10*3/uL (ref 0.00–0.07)
Basophils Absolute: 0 10*3/uL (ref 0.0–0.1)
Basophils Relative: 0 %
Eosinophils Absolute: 0 10*3/uL (ref 0.0–0.5)
Eosinophils Relative: 0 %
HCT: 38.6 % — ABNORMAL LOW (ref 39.0–52.0)
Hemoglobin: 13 g/dL (ref 13.0–17.0)
Immature Granulocytes: 1 %
Lymphocytes Relative: 29 %
Lymphs Abs: 2.1 10*3/uL (ref 0.7–4.0)
MCH: 33.4 pg (ref 26.0–34.0)
MCHC: 33.7 g/dL (ref 30.0–36.0)
MCV: 99.2 fL (ref 80.0–100.0)
Monocytes Absolute: 1.1 10*3/uL — ABNORMAL HIGH (ref 0.1–1.0)
Monocytes Relative: 16 %
Neutro Abs: 3.9 10*3/uL (ref 1.7–7.7)
Neutrophils Relative %: 54 %
Platelets: 142 10*3/uL — ABNORMAL LOW (ref 150–400)
RBC: 3.89 MIL/uL — ABNORMAL LOW (ref 4.22–5.81)
RDW: 13.4 % (ref 11.5–15.5)
WBC: 7.2 10*3/uL (ref 4.0–10.5)
nRBC: 0 % (ref 0.0–0.2)

## 2019-11-24 LAB — COMPREHENSIVE METABOLIC PANEL
ALT: 26 U/L (ref 0–44)
AST: 18 U/L (ref 15–41)
Albumin: 4.2 g/dL (ref 3.5–5.0)
Alkaline Phosphatase: 88 U/L (ref 38–126)
Anion gap: 9 (ref 5–15)
BUN: 20 mg/dL (ref 8–23)
CO2: 28 mmol/L (ref 22–32)
Calcium: 8.8 mg/dL — ABNORMAL LOW (ref 8.9–10.3)
Chloride: 106 mmol/L (ref 98–111)
Creatinine, Ser: 1.09 mg/dL (ref 0.61–1.24)
GFR calc Af Amer: >60 mL/min (ref >60)
GFR calc non Af Amer: >60 mL/min (ref >60)
Glucose, Bld: 119 mg/dL — ABNORMAL HIGH (ref 70–99)
Potassium: 4 mmol/L (ref 3.5–5.1)
Sodium: 143 mmol/L (ref 135–145)
Total Bilirubin: 0.9 mg/dL (ref 0.3–1.2)
Total Protein: 6.4 g/dL — ABNORMAL LOW (ref 6.5–8.1)

## 2019-11-24 LAB — LACTATE DEHYDROGENASE: LDH: 142 U/L (ref 98–192)

## 2019-11-24 MED ORDER — TRAMADOL HCL 50 MG PO TABS: 50.0000 mg | ORAL_TABLET | Freq: Two times a day (BID) | ORAL | 0 refills | Status: DC | PRN

## 2019-11-24 NOTE — Progress Notes (Signed)
Pt in for follow denies any concerns today.  Reports he needs his zovirax refilled and tramadol.

## 2019-11-24 NOTE — Assessment & Plan Note (Addendum)
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  STABLE  FEB 2021- CT-continued partial response. STABLE.   # Currently on Venoclax 100 mg [sec to diarrhea; DI with Amio]; continue at current dose.  We'll plan to get repeat imaging in August.  # Diarrhea-G-1; Sec to [employer]- improved.  # A.fib-on amiodarone [drug interaction-amiodarone] sinus rhythm-on Eliquis-stable.  # Joint pain-sec to Ventoclax-continue tramadol prn; new scipts  DISPOSITION:  # follow up in 80month MD; labs- cbc/cmp/ldh;-Dr.B
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
## 2019-11-25 ENCOUNTER — Encounter: Payer: Self-pay | Admitting: *Deleted

## 2019-11-25 ENCOUNTER — Telehealth: Payer: Self-pay | Admitting: *Deleted

## 2019-11-25 NOTE — Telephone Encounter (Signed)
Patient called reporting that pharmacy will only dispense 7 of the 60 tabs of Tramadol ordered and would like you to call him about this

## 2019-11-25 NOTE — Telephone Encounter (Signed)
Spoke with [employer]. Patient was dispense 30 days supply.

## 2019-11-25 NOTE — Telephone Encounter (Signed)
Spoke with patient. Pharmacy will only dispense 7 days supply. I explained to him this is most likely an insurance related issue. I will speak to pharmacy/insurance to determine why patient was only given 7 days worth of meds.

## 2019-12-04 NOTE — Progress Notes (Signed)
. Cardiology Office Note  Date:  12/05/2019   ID:  [name], DOB 06-28-1946, MRN [id number]  PCP:  Leone Haven, MD   Chief Complaint  Patient presents with  . office visit    Pt has no complaints. Meds verbally reviewed w/ pt.     HPI:  [name] is a 74 year old gentleman with a history of  smoking, coronary artery disease,  cabg,  catheterization March 2011 with occluded vein graft 2, patent LIMA, collaterals from left to right,  atrial fibrillation March 2013, 10/11/2014. Converted to normal with metoprolol,  Possible conversion pause. 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  "Leg pain from cancer medication" Walks with  [name] lipitor previously held ABIs normal  Total chol 104 up to 264 without lipitor On zetia  Periodic "drops outs of heart beat", once a month Pacer downloasds reviewed: no arrhythmia  No chest pain concerning for angina Has goats, active on his farm  No chest pain concerning for angina No significant shortness of breath  EKG personally reviewed by myself on todays visit Shows normal sinus rhythm rate 64 bpm nonspecific ST-T wave abnormality  Other past medical history reviewed Reports long history of bad muscle pain in thighs, No better with dexamethasone constant day and night,   Completed PT for 3 months with no relief Does not feel that his balance is much better  No palpitations concerning for atrial fib Chronic SOB walking up from barn, albuterol helps  finished cancer med infusion Plan for CT scan  Prior hematuria "a lot" WBC 252 eliquis held for a time Has PTSD, bad dreams He has seen psychiatry in the past which has helped  Left total hip on the right 10/22/16  fell off a ladder beginning of October 2017 CT at [facility]: 04/25/2016  showing large hematoma Right gluteal intramuscular hematoma  measuring 9.6 x 6.8 x 4.9 cm [facility] stopped eliquis, statin (unclear why they stopped his statin)  Working with the [facility] for PTSD, reports feeling well, stable Reports that he is been compliant with his Lasix, taking 80 mg daily which is higher dose than before for the past 5 days, no improvement in his leg swelling  atrial fibrillation/flutter starting September 12 2015, then persistent flutter In the clinic his heart rate was 126 bpm Amiodarone dosing was increased, amlodipine was changed to diltiazem 120 mg daily, metoprolol increased up to 25 mill grams twice a day Converted to normal sinus rhythm at home  tachycardia concerning for atrial fibrillation 05/10/2015. Lasted only several minutes Rate possibly up to 140 bpm.  Previously has been very active, building a barn, tool shed, taking care of animals some benefits from the [place] for agent orange exposure and his cancer  Previous catheterization showing occluded LAD in the mid vessel, 60% left main, 99% distal RCA who was sent to [facility] in October 2010 for bypass surgery. He received 3 vessel bypass by Dr.Gerhardt with a LIMA to the LAD, vein graft to the OM1 and vein graft to the PDA, with subsequent cardiac catheter March 2011 for chest discomfort showing occluded vein grafts x2 with patent LIMA. He has collateral vessels from left to right. Ejection fraction 55% mild anterolateral hypokinesis.  PMH:   has a past medical history of Anxiety, Arthritis, Atrial fibrillation ([place]), Cancer associated pain, Chicken pox,  Chronic lymphocytic leukemia ([place]), CLL (chronic lymphocytic leukemia) ([place]), Complication of anesthesia, COPD (chronic obstructive pulmonary disease) ([place]), Coronary artery disease, Dysrhythmia, GERD (gastroesophageal reflux disease), History of chemotherapy (2015-2016), HOH (hard of hearing), Hypertension, Myocardial infarction ([place]) (2010), OSA on CPAP,  Presence of permanent cardiac pacemaker (2017), PTSD (post-traumatic stress disorder), PTSD (post-traumatic stress disorder), Pure hypercholesterolemia, Rheumatic fever (1959), 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  PSH:    Past Surgical History:  Procedure Laterality Date  . ABDOMINAL HERNIA REPAIR    . APPENDECTOMY  06/21/1985  . CARDIAC CATHETERIZATION  2010; 2011   ; Dr Fletcher Anon  . CORONARY ARTERY BYPASS GRAFT  04/2009   "CABG X3"  . EP IMPLANTABLE DEVICE N/A 03/03/2016   Procedure: Pacemaker Implant;  Surgeon: Deboraha Sprang, MD;  Location: [facility];  Service: Cardiovascular;  Laterality: N/A;  . FOREIGN BODY REMOVAL  1968   "shrapnel in my tailbone"  . INGUINAL HERNIA REPAIR Right   . INSERT / REPLACE / REMOVE PACEMAKER    . JOINT REPLACEMENT Right 2018  . LAPAROSCOPIC CHOLECYSTECTOMY    . TONSILLECTOMY AND ADENOIDECTOMY  1956  . TOTAL HIP ARTHROPLASTY Right 10/22/2016   Procedure: TOTAL HIP ARTHROPLASTY;  Surgeon: Dereck Leep, MD;  Location: [facility];  Service: Orthopedics;  Laterality: Right;  . TOTAL HIP ARTHROPLASTY Left 11/04/2017   Procedure: TOTAL HIP ARTHROPLASTY;  Surgeon: Dereck Leep, MD;  Location: [facility];  Service: Orthopedics;  Laterality: Left;    Current Outpatient Medications  Medication Sig Dispense Refill  . acetaminophen (TYLENOL) 500 MG tablet Take 1,000 mg by mouth every 8 (eight) hours as needed for mild pain.    Marland Kitchen acyclovir (ZOVIRAX) 400 MG tablet Take 1 tablet (400 mg total) by mouth 2 (two) times daily. 60 tablet 3  . albuterol (PROVENTIL HFA;VENTOLIN HFA) 108 (90 Base) MCG/ACT inhaler Inhale 2 puffs into the lungs every 6 (six) hours as needed for wheezing or shortness of breath. 1 Inhaler 11  . amiodarone (PACERONE) 200 MG tablet Take 1/2 (one-half) tablet by mouth twice daily 90 tablet 0  . apixaban (ELIQUIS) 5 MG TABS tablet Take 5 mg by mouth 2 (two) times daily.    .  budesonide-formoterol (SYMBICORT) 160-4.5 MCG/ACT inhaler Inhale 2 puffs into the lungs 2 (two) times daily. 1 Inhaler 11  . cetirizine (ZYRTEC) 10 MG tablet Take 10 mg by mouth daily as needed for allergies.     . Coenzyme Q10 (COQ10) 200 MG CAPS Take 200 mg by mouth daily.    Marland Kitchen ezetimibe (ZETIA) 10 MG tablet Take 1 tablet (10 mg total) by mouth daily. 90 tablet 3  . furosemide (LASIX) 20 MG tablet Take 1 tablet (20 mg) by mouth twice daily as needed    . Krill Oil 350 MG CAPS Take 350 mg by mouth as needed.     . metoprolol tartrate (LOPRESSOR) 25 MG tablet Take 25 mg by mouth 2 (two) times daily.    . mirtazapine (REMERON) 15 MG tablet Take 15 mg by mouth at bedtime as needed (for panic associated with PTSD).     Marland Kitchen montelukast (SINGULAIR) 10 MG tablet Take 1 tablet (10 mg total) by mouth at bedtime. Start 2 days prior to infusion. Take it for 4 days. 60 tablet 0  . Multiple Vitamin (MULTIVITAMIN WITH MINERALS) TABS tablet Take 1 tablet by mouth daily.    . mupirocin ointment (BACTROBAN) 2 % Apply 1 application  topically 3 (three) times daily. 30 g 1  . nitroGLYCERIN (NITROSTAT) 0.4 MG SL tablet Place 1 tablet (0.4 mg total) under the tongue every 5 (five) minutes as needed for chest pain. 25 tablet 6  . tiotropium (SPIRIVA) 18 MCG inhalation capsule Place 1 capsule (18 mcg total) into inhaler and inhale at bedtime. 30 capsule 11  . traMADol (ULTRAM) 50 MG tablet Take 1 tablet (50 mg total) by mouth every 12 (twelve) hours as needed. 60 tablet 0  . venetoclax (VENCLEXTA) 100 MG TABS Take 200 mg by mouth daily. (Take 2 tablets daily) (Patient taking differently: Take 100 mg by mouth daily. (Take 2 tablets daily)) 60 tablet 6  . dexamethasone (DECADRON) 4 MG tablet Start 2 days prior to infusion; Take for 2 days. Do not take on the day of infusion. (Patient not taking: Reported on 12/05/2019) 60 tablet 3   No current facility-administered medications for this visit.     Allergies:   Patient has  no known allergies.   Social History:  The patient  reports that he quit smoking about 13 years ago. His smoking use included cigarettes. He has a 40.00 pack-year smoking history. He has never used smokeless tobacco. He reports previous alcohol use. He reports that he does not use drugs.   Family History:   family history includes Coronary artery disease in an other family member; Heart attack in his mother; Heart disease in his mother.    Review of Systems: Review of Systems  Constitutional: Negative.   HENT: Negative.   Respiratory: Negative.   Cardiovascular: Negative.   Gastrointestinal: Negative.   Musculoskeletal: Negative.        Leg pain  Neurological: Negative.   Psychiatric/Behavioral: Negative.   All other systems reviewed and are negative.   PHYSICAL EXAM: VS:  BP (!) 156/84 (BP Location: Left Arm, Patient Position: Sitting, Cuff Size: Normal)   Pulse 64   Ht 5\' 9"  (1.753 m)   Wt 222 lb 6 oz (100.9 kg)   SpO2 98%   BMI 32.84 kg/m  , BMI Body mass index is 32.84 kg/m. Constitutional:  oriented to person, place, and time. No distress.  HENT:  Head: Grossly normal Eyes:  no discharge. No scleral icterus.  Neck: No JVD, no carotid bruits  Cardiovascular: Regular rate and rhythm, no murmurs appreciated Pulmonary/Chest: Clear to auscultation bilaterally, no wheezes or rails Abdominal: Soft.  no distension.  no tenderness.  Musculoskeletal: Normal range of motion Neurological:  normal muscle tone. Coordination normal. No atrophy Skin: Skin warm and dry Psychiatric: normal affect, pleasant  Recent Labs: 03/29/2019: TSH 0.677 08/04/2019: Magnesium 2.1 11/24/2019: ALT 26; BUN 20; Creatinine, Ser 1.09; Hemoglobin 13.0; Platelets 142; Potassium 4.0; Sodium 143    Lipid Panel Lab Results  Component Value Date   CHOL 264 (H) 09/29/2019   HDL 49 09/29/2019   LDLCALC 172 (H) 09/29/2019   TRIG 213 (H) 09/29/2019      Wt Readings from Last 3 Encounters:  12/05/19 222  lb 6 oz (100.9 kg)  11/24/19 221 lb 6.4 oz (100.4 kg)  10/27/19 220 lb 8 oz (100 kg)     ASSESSMENT AND PLAN: Atrial fibrillation, unspecified type ([place])  Maintaining normal sinus rhythm, continue amiodarone, metoprolol  Back on Eliquis, no hematuria  HYPERCHOLESTEROLEMIA Currently on zetia daily Goal LDL less than 70 He is willing to retry low-dose Lipitor, does not feel the Lipitor was causing his leg pain  Essential hypertension Well controlled at home, Elevated on  today's visit after long walk into the office Will monitor at home  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 with angina pectoris with documented spasm ([place]) Currently with no symptoms of angina. No further workup at this time. Restart little bit of Lipitor 20 with slow titration up to 40 if tolerated  Chronic diastolic CHF (congestive heart failure) ([place]) He is not on Lasix,  Appears euvolemic  Centrilobular emphysema (H[facility]) Breathing stable, weight is down Reports breathing stable  Sinus pause  pacemaker placement, reports having occasional dropout or palpitation Pacer downloads reviewed, no significant arrhythmia  Leg pain No extremity Doppler fine, ABIs normal Pain persisted despite holding Lipitor, he would like to restart Lipitor Recommend he start 20 mg down from 80 Slow titration up to 40 if tolerated   Total encounter time more than 25 minutes  Greater than 50% was spent in counseling and coordination of care with the patient   Disposition:   F/U  6 months   Orders Placed This Encounter  Procedures  . EKG 12-Lead     Signed, Esmond Plants, M.D., Ph.D. 12/05/2019  [place], [place]

## 2019-12-05 ENCOUNTER — Other Ambulatory Visit: Payer: Self-pay

## 2019-12-05 ENCOUNTER — Encounter: Payer: Self-pay | Admitting: Cardiovascular Disease

## 2019-12-05 ENCOUNTER — Ambulatory Visit (INDEPENDENT_AMBULATORY_CARE_PROVIDER_SITE_OTHER): Payer: Medicare HMO | Admitting: Cardiovascular Disease

## 2019-12-05 VITALS — BP 156/84 | HR 64 | Ht 69.0 in | Wt 222.4 lb

## 2019-12-05 DIAGNOSIS — I739 Peripheral vascular disease, unspecified: Secondary | ICD-10-CM

## 2019-12-05 DIAGNOSIS — E782 Mixed hyperlipidemia: Secondary | ICD-10-CM

## 2019-12-05 DIAGNOSIS — I10 Essential (primary) hypertension: Secondary | ICD-10-CM

## 2019-12-05 DIAGNOSIS — I495 Sick sinus syndrome: Secondary | ICD-10-CM

## 2019-12-05 DIAGNOSIS — I480 Paroxysmal atrial fibrillation: Secondary | ICD-10-CM

## 2019-12-05 DIAGNOSIS — I5032 Chronic diastolic (congestive) heart failure: Secondary | ICD-10-CM | POA: Diagnosis not present

## 2019-12-05 NOTE — Patient Instructions (Addendum)
Medication Instructions:  Restart 1/4 pill atorvastatin (20 mg) for a few weeks Check lags, muscle  If ok, no change, then go up to 1/2 pill   If you need a refill on your cardiac medications before your next appointment, please call your pharmacy.    Lab work: No new labs needed   If you have labs (blood work) drawn today and your tests are completely normal, you will receive your results only by: Marland Kitchen MyChart Message (if you have MyChart) OR . A paper copy in the mail If you have any lab test that is abnormal or we need to change your treatment, we will call you to review the results.   Testing/Procedures: No new testing needed   Follow-Up: At [facility], you and your health needs are our priority.  As part of our continuing mission to provide you with exceptional heart care, we have created designated Provider Care Teams.  These Care Teams include your primary Cardiologist (physician) and Advanced Practice Providers (APPs -  Physician Assistants and Nurse Practitioners) who all work together to provide you with the care you need, when you need it.  . You will need a follow up appointment in 6 months   . Providers on your designated Care Team:   . Murray Hodgkins, NP . Christell Faith, PA-C . Marrianne Mood, PA-C  Any Other Special Instructions Will Be Listed Below (If Applicable).  For educational health videos Log in to : [email or web address] Or : [email or web address], password : [email or web address]

## 2019-12-09 ENCOUNTER — Ambulatory Visit (INDEPENDENT_AMBULATORY_CARE_PROVIDER_SITE_OTHER): Payer: Medicare HMO | Admitting: *Deleted

## 2019-12-09 ENCOUNTER — Telehealth: Payer: Self-pay

## 2019-12-09 DIAGNOSIS — I495 Sick sinus syndrome: Secondary | ICD-10-CM

## 2019-12-09 NOTE — Telephone Encounter (Signed)
Left message for patient to remind of missed remote transmission.  

## 2019-12-10 LAB — CUP PACEART REMOTE DEVICE CHECK
Battery Remaining Longevity: 69 mo
Battery Voltage: 3 V
Brady Statistic AP VP Percent: 0.27 %
Brady Statistic AP VS Percent: 97.58 %
Brady Statistic AS VP Percent: 0 %
Brady Statistic AS VS Percent: 2.15 %
Brady Statistic RA Percent Paced: 97.83 %
Brady Statistic RV Percent Paced: 0.28 %
Date Time Interrogation Session: 20210521144220
Implantable Lead Implant Date: 20170814
Implantable Lead Implant Date: 20170814
Implantable Lead Location: 753859
Implantable Lead Location: 753860
Implantable Lead Model: 5076
Implantable Lead Model: 5076
Implantable Pulse Generator Implant Date: 20170814
Lead Channel Impedance Value: 342 Ohm
Lead Channel Impedance Value: 418 Ohm
Lead Channel Impedance Value: 437 Ohm
Lead Channel Impedance Value: 532 Ohm
Lead Channel Pacing Threshold Amplitude: 0.75 V
Lead Channel Pacing Threshold Amplitude: 1 V
Lead Channel Pacing Threshold Pulse Width: 0.4 ms
Lead Channel Pacing Threshold Pulse Width: 0.4 ms
Lead Channel Sensing Intrinsic Amplitude: 23.875 mV
Lead Channel Sensing Intrinsic Amplitude: 23.875 mV
Lead Channel Sensing Intrinsic Amplitude: 3.875 mV
Lead Channel Sensing Intrinsic Amplitude: 3.875 mV
Lead Channel Setting Pacing Amplitude: 2 V
Lead Channel Setting Pacing Amplitude: 2.5 V
Lead Channel Setting Pacing Pulse Width: 0.4 ms
Lead Channel Setting Sensing Sensitivity: 0.9 mV

## 2019-12-12 NOTE — Progress Notes (Signed)
Remote pacemaker transmission.   

## 2019-12-14 MED FILL — VENCLEXTA 100 MG TABS: 100 | 30 days supply | Qty: 30 | Fill #1

## 2019-12-22 ENCOUNTER — Other Ambulatory Visit: Payer: Self-pay

## 2019-12-22 ENCOUNTER — Inpatient Hospital Stay (HOSPITAL_BASED_OUTPATIENT_CLINIC_OR_DEPARTMENT_OTHER): Payer: Medicare HMO | Admitting: Internal Medicine

## 2019-12-22 ENCOUNTER — Inpatient Hospital Stay: Payer: Medicare HMO | Attending: Internal Medicine

## 2019-12-22 ENCOUNTER — Encounter: Payer: Self-pay | Admitting: Internal Medicine

## 2019-12-22 DIAGNOSIS — M1990 Unspecified osteoarthritis, unspecified site: Secondary | ICD-10-CM | POA: Diagnosis not present

## 2019-12-22 DIAGNOSIS — R197 Diarrhea, unspecified: Secondary | ICD-10-CM | POA: Diagnosis not present

## 2019-12-22 DIAGNOSIS — I4891 Unspecified atrial fibrillation: Secondary | ICD-10-CM | POA: Insufficient documentation

## 2019-12-22 DIAGNOSIS — C9110 Chronic lymphocytic leukemia of B-cell type not having achieved remission: Secondary | ICD-10-CM | POA: Diagnosis present

## 2019-12-22 DIAGNOSIS — G8929 Other chronic pain: Secondary | ICD-10-CM | POA: Insufficient documentation

## 2019-12-22 DIAGNOSIS — M25559 Pain in unspecified hip: Secondary | ICD-10-CM | POA: Insufficient documentation

## 2019-12-22 DIAGNOSIS — Z7901 Long term (current) use of anticoagulants: Secondary | ICD-10-CM | POA: Diagnosis not present

## 2019-12-22 LAB — CBC WITH DIFFERENTIAL/PLATELET
Abs Immature Granulocytes: 0.04 10*3/uL (ref 0.00–0.07)
Basophils Absolute: 0 10*3/uL (ref 0.0–0.1)
Basophils Relative: 0 %
Eosinophils Absolute: 0 10*3/uL (ref 0.0–0.5)
Eosinophils Relative: 1 %
HCT: 36.3 % — ABNORMAL LOW (ref 39.0–52.0)
Hemoglobin: 12.3 g/dL — ABNORMAL LOW (ref 13.0–17.0)
Immature Granulocytes: 1 %
Lymphocytes Relative: 30 %
Lymphs Abs: 1.9 10*3/uL (ref 0.7–4.0)
MCH: 33.7 pg (ref 26.0–34.0)
MCHC: 33.9 g/dL (ref 30.0–36.0)
MCV: 99.5 fL (ref 80.0–100.0)
Monocytes Absolute: 1.1 10*3/uL — ABNORMAL HIGH (ref 0.1–1.0)
Monocytes Relative: 17 %
Neutro Abs: 3.4 10*3/uL (ref 1.7–7.7)
Neutrophils Relative %: 51 %
Platelets: 132 10*3/uL — ABNORMAL LOW (ref 150–400)
RBC: 3.65 MIL/uL — ABNORMAL LOW (ref 4.22–5.81)
RDW: 13.8 % (ref 11.5–15.5)
WBC: 6.4 10*3/uL (ref 4.0–10.5)
nRBC: 0 % (ref 0.0–0.2)

## 2019-12-22 LAB — COMPREHENSIVE METABOLIC PANEL
ALT: 24 U/L (ref 0–44)
AST: 16 U/L (ref 15–41)
Albumin: 4.1 g/dL (ref 3.5–5.0)
Alkaline Phosphatase: 83 U/L (ref 38–126)
Anion gap: 8 (ref 5–15)
BUN: 24 mg/dL — ABNORMAL HIGH (ref 8–23)
CO2: 29 mmol/L (ref 22–32)
Calcium: 8.8 mg/dL — ABNORMAL LOW (ref 8.9–10.3)
Chloride: 107 mmol/L (ref 98–111)
Creatinine, Ser: 1.03 mg/dL (ref 0.61–1.24)
GFR calc Af Amer: >60 mL/min (ref >60)
GFR calc non Af Amer: >60 mL/min (ref >60)
Glucose, Bld: 119 mg/dL — ABNORMAL HIGH (ref 70–99)
Potassium: 4.6 mmol/L (ref 3.5–5.1)
Sodium: 144 mmol/L (ref 135–145)
Total Bilirubin: 1 mg/dL (ref 0.3–1.2)
Total Protein: 6.4 g/dL — ABNORMAL LOW (ref 6.5–8.1)

## 2019-12-22 LAB — LACTATE DEHYDROGENASE: LDH: 151 U/L (ref 98–192)

## 2019-12-22 NOTE — Assessment & Plan Note (Signed)
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  STABLE  FEB 2021- CT-continued partial response. STABLE.    # Currently on Venoclax 100 mg [sec to diarrhea; DI with Amio]; continue at current dose.  Will order imaging at next visit.   # Diarrhea-G-1; Sec to [employer]- stable.   # A.fib-on amiodarone [drug interaction-amiodarone] sinus rhythm-on Eliquis- stable.   # Joint pain-sec to Ventoclax-continue tramadol prn;STABLE.   DISPOSITION:  # follow up in 65month MD; labs- cbc/cmp/ldh;-Dr.B

## 2019-12-22 NOTE — Progress Notes (Signed)
[place] OFFICE PROGRESS NOTE  Patient Care Team: Leone Haven, MD as PCP - General (Family Medicine) [name], MD as Consulting Physician (Cardiology) Cammie Sickle, MD as Medical Oncologist (Hematology and Oncology)  Cancer Staging No matching staging information was found for the patient.   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  MARCH 2017- CT N/C/A/P- NED. STOP Ritux; surveillance   # AUG 2019- CT/PET- progression/NO transformation;  # NOV 2019- Progression; started Ibrutinib 420 mg/d. STOPPED in end of feb sec to extreme fatigue/joint pains/cramps  #November 01, 2018-start ibrutinib 280 mg a day; December 20, 2018-discontinue ibrutinib secondary multiple side effects.   #January 03, 2019 start [facility][July]; finished Dec 2020-; [facility] [200 mg a day; DI-amiodarone]  #Mid March 2021-venetoclax dose reduced to 100 mg [second diarrhea].     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      INTERVAL HISTORY:  TDANIE [id number].o.  male CLL-high risk currently on venetoclax is here for follow-up.  Patient continues to have chronic joint pains chronic hip pain which is not getting any worse.  Continues to have 1-2 loose stools a day; however not significantly impacting his daily life.  Denies any lumps or bumps.   Review of Systems  Constitutional: Positive for malaise/fatigue. Negative for chills, diaphoresis and fever.  HENT: Negative for nosebleeds and sore throat.   Eyes: Negative for double vision.  Respiratory: Negative for cough, hemoptysis, sputum production, shortness of breath and wheezing.   Cardiovascular: Negative for chest pain, palpitations, orthopnea and leg swelling.  Gastrointestinal: Negative for abdominal pain, blood in stool, constipation, heartburn, melena, nausea and vomiting.  Genitourinary: Negative for dysuria, frequency and urgency.  Musculoskeletal: Positive for back pain, joint pain and myalgias.  Skin: Negative.  Negative for itching and rash.  Neurological: Negative for dizziness, tingling, focal weakness and headaches.  Psychiatric/Behavioral: Negative for depression. The patient is not nervous/anxious and does not have insomnia.       PAST MEDICAL HISTORY :  Past Medical History:  Diagnosis Date  . Anxiety   . Arthritis   . Atrial fibrillation ([place]    a. Dx 2013, recurred 02/2014, CHA2DS2VASc = 3 -->placed on Eliquis;  b. 02/2014 Echo: EF 50-55%, mid and apical anterior septum and mid and apical inf septum are abnl, mild to mod Ao sclerosis w/o AS.  .Marland KitchenCancer associated pain   . Chicken pox   . Chronic lymphocytic leukemia ([place]    a. Dx 02/2014.  .Marland KitchenCLL (chronic lymphocytic leukemia) ([place]   . Complication of anesthesia  History of  PTSD--do not touch patient when waking up from surgery.  Marland Kitchen COPD (chronic obstructive pulmonary disease) ([place])   . Coronary artery disease    a. 04/2009 CABG x 3 (LIMA->LAD, VG->OM1, VG->PDA);  b. 09/2009 Cath: occluded VG x 2 w/  patent LIMA and L->R collats. EF 55%, mild antlat HK;  c. 10/2011 MV: EF 53%, no isch/infarct-->low risk.  Marland Kitchen Dysrhythmia    hx of a-fib  . GERD (gastroesophageal reflux disease)    occasional  . History of chemotherapy 2015-2016  . HOH (hard of hearing)    Bilateral Hearing Aids  . Hypertension   . Myocardial infarction ([place]) 2010  . OSA on CPAP    USE C-PAP  . Presence of permanent cardiac pacemaker 2017  . PTSD (post-traumatic stress disorder)   . PTSD (post-traumatic stress disorder)   . Pure hypercholesterolemia   . Rheumatic fever 1959  . 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 TIA (transient ischemic attack) 11/02/2015    PAST SURGICAL HISTORY :   Past Surgical History:  Procedure Laterality Date  . ABDOMINAL HERNIA REPAIR    . APPENDECTOMY  06/21/1985  . CARDIAC CATHETERIZATION  2010; 2011   ; Dr Fletcher Anon  . CORONARY ARTERY BYPASS GRAFT  04/2009   "CABG X3"  . EP IMPLANTABLE DEVICE N/A 03/03/2016   Procedure: Pacemaker Implant;  Surgeon: Deboraha Sprang, MD;  Location: [facility];  Service: Cardiovascular;  Laterality: N/A;  . FOREIGN BODY REMOVAL  1968   "shrapnel in my tailbone"  . INGUINAL HERNIA REPAIR Right   . INSERT / REPLACE / REMOVE PACEMAKER    . JOINT REPLACEMENT Right 2018  . LAPAROSCOPIC CHOLECYSTECTOMY    . TONSILLECTOMY AND ADENOIDECTOMY  1956  . TOTAL HIP ARTHROPLASTY Right 10/22/2016   Procedure: TOTAL HIP ARTHROPLASTY;  Surgeon: Dereck Leep, MD;  Location: [facility];  Service: Orthopedics;  Laterality: Right;  . TOTAL HIP ARTHROPLASTY Left 11/04/2017   Procedure: TOTAL HIP ARTHROPLASTY;  Surgeon: Dereck Leep, MD;  Location: [facility];  Service: Orthopedics;  Laterality: Left;    FAMILY HISTORY :   Family History  Problem Relation Age of Onset  . Heart disease Mother   . Heart attack Mother   . Coronary artery disease Other        family history    SOCIAL HISTORY:   Social History   Tobacco Use  . Smoking status:  Former Smoker    Packs/day: 1.00    Years: 40.00    Pack years: 40.00    Types: Cigarettes    Quit date: 07/21/2006    Years since quitting: 13.4  . Smokeless tobacco: Never Used  Substance Use Topics  . Alcohol use: Not Currently  . Drug use: No    ALLERGIES:  has No Known Allergies.  MEDICATIONS:  Current Outpatient Medications  Medication Sig Dispense Refill  . acetaminophen (TYLENOL) 500 MG tablet Take 1,000 mg by mouth every 8 (eight) hours as needed for mild pain.    Marland Kitchen acyclovir (ZOVIRAX) 400 MG tablet Take 1 tablet (400 mg total) by mouth 2 (two) times daily. 60 tablet 3  . albuterol (PROVENTIL HFA;VENTOLIN HFA) 108 (90 Base) MCG/ACT inhaler Inhale 2 puffs into the lungs every 6 (six) hours as needed for wheezing or shortness of breath. 1 Inhaler 11  . amiodarone (PACERONE) 200 MG tablet Take 1/2 (one-half) tablet by mouth twice daily 90 tablet 0  . apixaban (ELIQUIS) 5 MG  TABS tablet Take 5 mg by mouth 2 (two) times daily.    Marland Kitchen atorvastatin (LIPITOR) 40 MG tablet Take 40 mg by mouth daily. Start back slowly with 20 mg working up on slow titration to 45m per Dr GRockey Situ    . budesonide-formoterol (SYMBICORT) 160-4.5 MCG/ACT inhaler Inhale 2 puffs into the lungs 2 (two) times daily. 1 Inhaler 11  . cetirizine (ZYRTEC) 10 MG tablet Take 10 mg by mouth daily as needed for allergies.     . Coenzyme Q10 (COQ10) 200 MG CAPS Take 200 mg by mouth daily.    .Marland Kitchenezetimibe (ZETIA) 10 MG tablet Take 1 tablet (10 mg total) by mouth daily. 90 tablet 3  . furosemide (LASIX) 20 MG tablet Take 1 tablet (20 mg) by mouth twice daily as needed    . Krill Oil 350 MG CAPS Take 350 mg by mouth as needed.     . metoprolol tartrate (LOPRESSOR) 25 MG tablet Take 25 mg by mouth 2 (two) times daily.    . mirtazapine (REMERON) 15 MG tablet Take 15 mg by mouth at bedtime as needed (for panic associated with PTSD).     .Marland Kitchenmontelukast (SINGULAIR) 10 MG tablet Take 1 tablet (10 mg total) by mouth at bedtime. Start  2 days prior to infusion. Take it for 4 days. 60 tablet 0  . Multiple Vitamin (MULTIVITAMIN WITH MINERALS) TABS tablet Take 1 tablet by mouth daily.    .Marland Kitchentiotropium (SPIRIVA) 18 MCG inhalation capsule Place 1 capsule (18 mcg total) into inhaler and inhale at bedtime. 30 capsule 11  . traMADol (ULTRAM) 50 MG tablet Take 1 tablet (50 mg total) by mouth every 12 (twelve) hours as needed. 60 tablet 0  . venetoclax (VENCLEXTA) 100 MG TABS Take 200 mg by mouth daily. (Take 2 tablets daily) (Patient taking differently: Take 100 mg by mouth daily. ) 60 tablet 6  . dexamethasone (DECADRON) 4 MG tablet Start 2 days prior to infusion; Take for 2 days. Do not take on the day of infusion. (Patient not taking: Reported on 12/05/2019) 60 tablet 3  . mupirocin ointment (BACTROBAN) 2 % Apply 1 application topically 3 (three) times daily. (Patient not taking: Reported on 12/22/2019) 30 g 1  . nitroGLYCERIN (NITROSTAT) 0.4 MG SL tablet Place 1 tablet (0.4 mg total) under the tongue every 5 (five) minutes as needed for chest pain. (Patient not taking: Reported on 12/22/2019) 25 tablet 6   No current facility-administered medications for this visit.    PHYSICAL EXAMINATION: ECOG PERFORMANCE STATUS: 1 - Symptomatic but completely ambulatory  BP (!) 154/84 (BP Location: Left Arm, Patient Position: Sitting)   Pulse 63   Temp (!) 97.3 F (36.3 C) (Tympanic)   Resp 18   Wt 221 lb 12.8 oz (100.6 kg)   BMI 32.75 kg/m   Filed Weights   12/22/19 0959  Weight: 221 lb 12.8 oz (100.6 kg)    Physical Exam  Constitutional: He is oriented to person, place, and time and well-developed, well-nourished, and in no distress.  Patient is alone.  HENT:  Head: Normocephalic and atraumatic.  Mouth/Throat: Oropharynx is clear and moist. No oropharyngeal exudate.  Eyes: Pupils are equal, round, and reactive to light.  Cardiovascular: Normal rate and regular rhythm.  Pulmonary/Chest: No respiratory distress. He has no wheezes.   Abdominal: Soft. Bowel sounds are normal. He exhibits no distension and no mass. There is no abdominal tenderness. There is no rebound and no guarding.  Musculoskeletal:  General: No tenderness or edema. Normal range of motion.     Cervical back: Normal range of motion and neck supple.  Lymphadenopathy:  Resolved lymph nodes in the neck underarms.  Neurological: He is alert and oriented to person, place, and time.  Skin: Skin is warm.  Psychiatric: Affect normal.    LABORATORY DATA:  I have reviewed the data as listed    Component Value Date/Time   NA 144 12/22/2019 0927   NA 139 10/11/2014 1800   K 4.6 12/22/2019 0927   K 3.3 (L) 10/11/2014 1800   CL 107 12/22/2019 0927   CL 106 10/11/2014 1800   CO2 29 12/22/2019 0927   CO2 27 10/11/2014 1800   GLUCOSE 119 (H) 12/22/2019 0927   GLUCOSE 107 (H) 10/11/2014 1800   BUN 24 (H) 12/22/2019 0927   BUN 15 10/11/2014 1800   CREATININE 1.03 12/22/2019 0927   CREATININE 0.89 10/11/2014 1800   CALCIUM 8.8 (L) 12/22/2019 0927   CALCIUM 8.8 (L) 10/11/2014 1800   PROT 6.4 (L) 12/22/2019 0927   PROT 6.7 05/18/2017 1048   PROT 6.4 (L) 10/11/2014 1800   ALBUMIN 4.1 12/22/2019 0927   ALBUMIN 4.3 05/18/2017 1048   ALBUMIN 4.1 10/11/2014 1800   AST 16 12/22/2019 0927   AST 23 10/11/2014 1800   ALT 24 12/22/2019 0927   ALT 22 10/11/2014 1800   ALKPHOS 83 12/22/2019 0927   ALKPHOS 61 10/11/2014 1800   BILITOT 1.0 12/22/2019 0927   BILITOT 0.7 05/18/2017 1048   BILITOT 0.9 10/11/2014 1800   GFRNONAA >60 12/22/2019 0927   GFRNONAA >60 10/11/2014 1800   GFRAA >60 12/22/2019 0927   GFRAA >60 10/11/2014 1800    No results found for: SPEP, UPEP  Lab Results  Component Value Date   WBC 6.4 12/22/2019   NEUTROABS 3.4 12/22/2019   HGB 12.3 (L) 12/22/2019   HCT 36.3 (L) 12/22/2019   MCV 99.5 12/22/2019   PLT 132 (L) 12/22/2019      Chemistry      Component Value Date/Time   NA 144 12/22/2019 0927   NA 139 10/11/2014 1800    K 4.6 12/22/2019 0927   K 3.3 (L) 10/11/2014 1800   CL 107 12/22/2019 0927   CL 106 10/11/2014 1800   CO2 29 12/22/2019 0927   CO2 27 10/11/2014 1800   BUN 24 (H) 12/22/2019 0927   BUN 15 10/11/2014 1800   CREATININE 1.03 12/22/2019 0927   CREATININE 0.89 10/11/2014 1800      Component Value Date/Time   CALCIUM 8.8 (L) 12/22/2019 0927   CALCIUM 8.8 (L) 10/11/2014 1800   ALKPHOS 83 12/22/2019 0927   ALKPHOS 61 10/11/2014 1800   AST 16 12/22/2019 0927   AST 23 10/11/2014 1800   ALT 24 12/22/2019 0927   ALT 22 10/11/2014 1800   BILITOT 1.0 12/22/2019 0927   BILITOT 0.7 05/18/2017 1048   BILITOT 0.9 10/11/2014 1800       RADIOGRAPHIC STUDIES: I have personally reviewed the radiological images as listed and agreed with the findings in the report. No results found.   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  STABLE  FEB 2021- CT-continued partial response. STABLE.    # Currently on Venoclax 100 mg [sec to diarrhea; DI with Amio]; continue at current dose.  Will order imaging at next visit.   # Diarrhea-G-1; Sec to  Ventolax- stable.   # A.fib-on amiodarone [drug interaction-amiodarone] sinus rhythm-on Eliquis- stable.   # Joint pain-sec to Ventoclax-continue tramadol prn;STABLE.   DISPOSITION:  # follow up in 63month MD; labs- cbc/cmp/ldh;-Dr.B    No orders of the defined types were placed in this encounter.  All questions were answered. The patient knows to call the clinic with any problems, questions or concerns.      GCammie Sickle MD 12/22/2019 10:14 AM

## 2019-12-22 NOTE — Progress Notes (Signed)
Pt in for follow up, states Dr Rockey Situ had him start back Lipitor, pt and MD note states starting at 20mg  and slowly working up to 40mg  daily.

## 2020-01-12 ENCOUNTER — Other Ambulatory Visit: Payer: Self-pay | Admitting: Cardiovascular Disease

## 2020-01-12 MED FILL — VENCLEXTA 100 MG TABS: 100 | 30 days supply | Qty: 30 | Fill #2

## 2020-01-12 NOTE — Telephone Encounter (Signed)
°*  STAT* If patient is at the pharmacy, call can be transferred to refill team.   1. Which medications need to be refilled? (please list name of each medication and dose if known)  Amiodarone (PACERONE) 200 MG 1/2 tablet 2 times daily  2. Which pharmacy/location (including street and city if local pharmacy) is medication to be sent to? [facility] in [place]  3. Do they need a 30 day or 90 day supply?  90 day

## 2020-01-17 ENCOUNTER — Encounter: Payer: Self-pay | Admitting: Family Medicine

## 2020-01-17 ENCOUNTER — Other Ambulatory Visit: Payer: Self-pay

## 2020-01-17 ENCOUNTER — Ambulatory Visit (INDEPENDENT_AMBULATORY_CARE_PROVIDER_SITE_OTHER): Payer: Medicare HMO | Admitting: Family Medicine

## 2020-01-17 DIAGNOSIS — J432 Centrilobular emphysema: Secondary | ICD-10-CM | POA: Diagnosis not present

## 2020-01-17 DIAGNOSIS — M170 Bilateral primary osteoarthritis of knee: Secondary | ICD-10-CM

## 2020-01-17 DIAGNOSIS — I10 Essential (primary) hypertension: Secondary | ICD-10-CM

## 2020-01-17 DIAGNOSIS — G4733 Obstructive sleep apnea (adult) (pediatric): Secondary | ICD-10-CM

## 2020-01-17 DIAGNOSIS — I480 Paroxysmal atrial fibrillation: Secondary | ICD-10-CM | POA: Diagnosis not present

## 2020-01-17 DIAGNOSIS — Z9989 Dependence on other enabling machines and devices: Secondary | ICD-10-CM

## 2020-01-17 NOTE — Progress Notes (Signed)
  [name], MD Phone: [phone]  [name] is a 74 y.o. male who presents today for follow-up.  Hypertension/A. fib: No chest pain or palpitations.  No bleeding issues.  On amiodarone and Eliquis.  COPD: Chronic dyspnea on exertion particularly when it is humid outside.  Takes Symbicort twice daily.  Albuterol 2 times a week.  Notes this is stable.  OSA: Using CPAP 5 to 6 hours at night.  No hypersomnia.  He does wake well rested.  Chronic knee pain: This is been an ongoing issue.  He rarely takes tramadol for this.  He does not really want anything else.  Hyperlipidemia: He came off of his statin therapy previously as he was having muscle pain related to his oncological treatment and they were trying to rule out his statin as a cause.  He is now back on this for about a month.  Social History   Tobacco Use  Smoking Status Former Smoker  . Packs/day: 1.00  . Years: 40.00  . Pack years: 40.00  . Types: Cigarettes  . Quit date: 07/21/2006  . Years since quitting: 13.5  Smokeless Tobacco Never Used     ROS see history of present illness  Objective  Physical Exam Vitals:   01/17/20 1202  BP: 118/78  Pulse: 78  Temp: 98.6 F (37 C)  SpO2: 96%    BP Readings from Last 3 Encounters:  01/17/20 118/78  12/22/19 (!) 154/84  12/05/19 (!) 156/84   Wt Readings from Last 3 Encounters:  01/17/20 215 lb 12.8 oz (97.9 kg)  12/22/19 221 lb 12.8 oz (100.6 kg)  12/05/19 222 lb 6 oz (100.9 kg)    Physical Exam Constitutional:      General: He is not in acute distress.    Appearance: He is not diaphoretic.  Cardiovascular:     Rate and Rhythm: Normal rate and regular rhythm.     Heart sounds: Normal heart sounds.  Pulmonary:     Effort: Pulmonary effort is normal.     Breath sounds: Normal breath sounds.  Musculoskeletal:     Right lower leg: No edema.     Left lower leg: No edema.  Skin:    General: Skin is warm and dry.  Neurological:     Mental Status:  He is alert.      Assessment/Plan: Please see individual problem list.  Paroxysmal atrial fibrillation (HCC) Sinus rhythm at this time.  He will continue to see cardiology and his medications through them.  Discussed bleeding precautions with his Eliquis.  Essential hypertension Well-controlled.  OSA on CPAP Tolerating CPAP.  He will continue this.  Request compliance report.  COPD (chronic obstructive pulmonary disease) Chronic and stable.  Continue current regimen.  Osteoarthritis of both knees Chronic and stable.  Continue as needed tramadol.   No orders of the defined types were placed in this encounter.   No orders of the defined types were placed in this encounter.   This visit occurred during the SARS-CoV-2 public health emergency.  Safety protocols were in place, including screening questions prior to the visit, additional usage of staff PPE, and extensive cleaning of exam room while observing appropriate contact time as indicated for disinfecting solutions.    [name], MD [place]

## 2020-01-17 NOTE — Assessment & Plan Note (Signed)
Well controlled 

## 2020-01-17 NOTE — Assessment & Plan Note (Signed)
Sinus rhythm at this time.  He will continue to see cardiology and his medications through them.  Discussed bleeding precautions with his Eliquis.

## 2020-01-17 NOTE — Patient Instructions (Signed)
Nice to see you. We will see if your cancer doctor can check your lipids again with your next blood test.

## 2020-01-17 NOTE — Assessment & Plan Note (Signed)
Chronic and stable.  Continue as needed tramadol.

## 2020-01-17 NOTE — Assessment & Plan Note (Signed)
Tolerating CPAP.  He will continue this.  Request compliance report.

## 2020-01-17 NOTE — Assessment & Plan Note (Signed)
Continue current regimen.   [place] BP at home regularly and bring readings to appointments.

## 2020-01-19 ENCOUNTER — Inpatient Hospital Stay (HOSPITAL_BASED_OUTPATIENT_CLINIC_OR_DEPARTMENT_OTHER): Payer: Medicare HMO | Admitting: Internal Medicine

## 2020-01-19 ENCOUNTER — Other Ambulatory Visit: Payer: Self-pay

## 2020-01-19 ENCOUNTER — Telehealth: Payer: Self-pay

## 2020-01-19 ENCOUNTER — Encounter: Payer: Self-pay | Admitting: Internal Medicine

## 2020-01-19 ENCOUNTER — Inpatient Hospital Stay: Payer: Medicare HMO | Attending: Internal Medicine

## 2020-01-19 VITALS — BP 127/67 | HR 64 | Temp 98.3°F | Resp 16 | Ht 69.0 in | Wt 218.0 lb

## 2020-01-19 DIAGNOSIS — C9110 Chronic lymphocytic leukemia of B-cell type not having achieved remission: Secondary | ICD-10-CM

## 2020-01-19 DIAGNOSIS — Z87891 Personal history of nicotine dependence: Secondary | ICD-10-CM | POA: Insufficient documentation

## 2020-01-19 DIAGNOSIS — Z7901 Long term (current) use of anticoagulants: Secondary | ICD-10-CM | POA: Insufficient documentation

## 2020-01-19 DIAGNOSIS — M25559 Pain in unspecified hip: Secondary | ICD-10-CM | POA: Insufficient documentation

## 2020-01-19 DIAGNOSIS — M2550 Pain in unspecified joint: Secondary | ICD-10-CM | POA: Diagnosis not present

## 2020-01-19 DIAGNOSIS — I4891 Unspecified atrial fibrillation: Secondary | ICD-10-CM | POA: Diagnosis not present

## 2020-01-19 DIAGNOSIS — G4733 Obstructive sleep apnea (adult) (pediatric): Secondary | ICD-10-CM

## 2020-01-19 LAB — COMPREHENSIVE METABOLIC PANEL
ALT: 19 U/L (ref 0–44)
AST: 16 U/L (ref 15–41)
Albumin: 4.1 g/dL (ref 3.5–5.0)
Alkaline Phosphatase: 97 U/L (ref 38–126)
Anion gap: 10 (ref 5–15)
BUN: 21 mg/dL (ref 8–23)
CO2: 29 mmol/L (ref 22–32)
Calcium: 9 mg/dL (ref 8.9–10.3)
Chloride: 103 mmol/L (ref 98–111)
Creatinine, Ser: 1.02 mg/dL (ref 0.61–1.24)
GFR calc Af Amer: >60 mL/min (ref >60)
GFR calc non Af Amer: >60 mL/min (ref >60)
Glucose, Bld: 124 mg/dL — ABNORMAL HIGH (ref 70–99)
Potassium: 4.2 mmol/L (ref 3.5–5.1)
Sodium: 142 mmol/L (ref 135–145)
Total Bilirubin: 1.7 mg/dL — ABNORMAL HIGH (ref 0.3–1.2)
Total Protein: 6.8 g/dL (ref 6.5–8.1)

## 2020-01-19 LAB — CBC WITH DIFFERENTIAL/PLATELET
Abs Immature Granulocytes: 0.05 10*3/uL (ref 0.00–0.07)
Basophils Absolute: 0 10*3/uL (ref 0.0–0.1)
Basophils Relative: 1 %
Eosinophils Absolute: 0.1 10*3/uL (ref 0.0–0.5)
Eosinophils Relative: 1 %
HCT: 35.9 % — ABNORMAL LOW (ref 39.0–52.0)
Hemoglobin: 12.3 g/dL — ABNORMAL LOW (ref 13.0–17.0)
Immature Granulocytes: 1 %
Lymphocytes Relative: 30 %
Lymphs Abs: 2.5 10*3/uL (ref 0.7–4.0)
MCH: 34 pg (ref 26.0–34.0)
MCHC: 34.3 g/dL (ref 30.0–36.0)
MCV: 99.2 fL (ref 80.0–100.0)
Monocytes Absolute: 1.1 10*3/uL — ABNORMAL HIGH (ref 0.1–1.0)
Monocytes Relative: 14 %
Neutro Abs: 4.5 10*3/uL (ref 1.7–7.7)
Neutrophils Relative %: 53 %
Platelets: 124 10*3/uL — ABNORMAL LOW (ref 150–400)
RBC: 3.62 MIL/uL — ABNORMAL LOW (ref 4.22–5.81)
RDW: 14 % (ref 11.5–15.5)
WBC: 8.2 10*3/uL (ref 4.0–10.5)
nRBC: 0 % (ref 0.0–0.2)

## 2020-01-19 LAB — LACTATE DEHYDROGENASE: LDH: 142 U/L (ref 98–192)

## 2020-01-19 NOTE — Progress Notes (Signed)
I called and spoke to a representative to send me a 30 day compliance report on the patient's CPAP, she is sending it today.  Andilynn Delavega,cma

## 2020-01-19 NOTE — Assessment & Plan Note (Addendum)
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  STABLE  FEB 2021- CT-continued partial response.  Stable.     # Currently on Venoclax 100 mg [sec to diarrhea; DI with Amio]; continue at current dose.  Will order imaging today.   # Diarrhea-G-1; Sec to [employer]  # A.fib-on amiodarone [drug interaction-amiodarone] sinus rhythm-on Eliquis-stable  # Joint pain-sec to Ventoclax-continue tramadol prn; stable  DISPOSITION:  # follow up in 32month MD; labs- cbc/cmp/ldh;priro-CT N/C/A/P- Dr.B

## 2020-01-20 NOTE — Telephone Encounter (Signed)
Reviewed compliance report. It appears that he is compliant greater than 80% of the time and he has an adequate treatment from the CPAP though his mask appears to leak too much. Would he be willing to see pulmonology to consider an alternative mask that may fit better?

## 2020-01-22 NOTE — Progress Notes (Signed)
[facility] OFFICE PROGRESS NOTE  Patient Care Team: Leone Haven, MD as PCP - General (Family Medicine) [name], MD as Consulting Physician (Cardiology) Cammie Sickle, MD as Medical Oncologist (Hematology and Oncology)  Cancer Staging No matching staging information was found for the patient.   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  MARCH 2017- CT N/C/A/P- NED. STOP Ritux; surveillance   # AUG 2019- CT/PET- progression/NO transformation;  # NOV 2019- Progression; started Ibrutinib 420 mg/d. STOPPED in end of feb sec to extreme fatigue/joint pains/cramps  #November 01, 2018-start ibrutinib 280 mg a day; December 20, 2018-discontinue ibrutinib secondary multiple side effects.   #January 03, 2019 start GLula[July]; finished Dec 2020-; [facility] [200 mg a day; DI-amiodarone]  #Mid March 2021-venetoclax dose reduced to 100 mg [second diarrhea].     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      INTERVAL HISTORY:  TRITTER HELSLEY89y.o.  male CLL-high risk currently on venetoclax is here for follow-up.  Patient continues to have 1-2 loose stools a day.  This is not interfering with his daily life.  Also complains of chronic joint pains chronic hip pain is overall stable.  Monitoring any worse.  No new lumps or bumps.  \Review of Systems  Constitutional: Positive for malaise/fatigue. Negative for chills, diaphoresis and fever.  HENT: Negative for nosebleeds and sore throat.   Eyes: Negative for double vision.  Respiratory: Negative for cough, hemoptysis, sputum production, shortness of breath and wheezing.   Cardiovascular: Negative for chest pain, palpitations, orthopnea and leg swelling.  Gastrointestinal: Negative for abdominal pain, blood in stool, constipation, heartburn, melena, nausea and vomiting.  Genitourinary: Negative for dysuria, frequency and urgency.  Musculoskeletal: Positive for back pain, joint pain and myalgias.  Skin: Negative.  Negative for itching and rash.  Neurological: Negative for dizziness, tingling, focal weakness and headaches.  Psychiatric/Behavioral: Negative for depression. The patient is not nervous/anxious and does not have insomnia.       PAST MEDICAL HISTORY :  Past Medical History:  Diagnosis Date   Anxiety    Arthritis    Atrial fibrillation ([place]    a. Dx 2013, recurred 02/2014, CHA2DS2VASc = 3 -->placed on Eliquis;  b. 02/2014 Echo: EF 50-55%, mid and apical anterior septum and mid and apical inf septum are abnl, mild to mod Ao sclerosis w/o AS.   Cancer associated pain    Chicken pox    Chronic lymphocytic leukemia ([place]    a. Dx 02/2014.   CLL (chronic lymphocytic leukemia) (HCC)    Complication of anesthesia  History of  PTSD--do not touch patient when waking up from surgery.   COPD (chronic obstructive pulmonary disease) (HCC)    Coronary artery disease    a. 04/2009 CABG x 3 (LIMA->LAD, VG->OM1, VG->PDA);  b. 09/2009 Cath: occluded VG x 2 w/  patent LIMA and L->R collats. EF 55%, mild antlat HK;  c. 10/2011 MV: EF 53%, no isch/infarct-->low risk.   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   TIA (transient ischemic attack) 11/02/2015    PAST SURGICAL HISTORY :   Past Surgical History:  Procedure Laterality Date   ABDOMINAL HERNIA REPAIR     APPENDECTOMY  06/21/1985   CARDIAC CATHETERIZATION  2010; 2011   ; Dr Fletcher Anon   CORONARY ARTERY BYPASS GRAFT  04/2009   "CABG X3"   EP IMPLANTABLE DEVICE N/A 03/03/2016   Procedure: Pacemaker Implant;  Surgeon: Deboraha Sprang, MD;  Location: [facility];  Service: Cardiovascular;  Laterality: N/A;   FOREIGN BODY REMOVAL  1968   "shrapnel in my tailbone"   INGUINAL HERNIA REPAIR Right    INSERT / REPLACE / REMOVE PACEMAKER     JOINT REPLACEMENT Right 2018   LAPAROSCOPIC CHOLECYSTECTOMY     TONSILLECTOMY AND ADENOIDECTOMY  1956   TOTAL HIP ARTHROPLASTY Right 10/22/2016   Procedure: TOTAL HIP ARTHROPLASTY;  Surgeon: Dereck Leep, MD;  Location: [facility];  Service: Orthopedics;  Laterality: Right;   TOTAL HIP ARTHROPLASTY Left 11/04/2017   Procedure: TOTAL HIP ARTHROPLASTY;  Surgeon: Dereck Leep, MD;  Location: [facility];  Service: Orthopedics;  Laterality: Left;    FAMILY HISTORY :   Family History  Problem Relation Age of Onset   Heart disease Mother    Heart attack Mother    Coronary artery disease Other        family history    SOCIAL HISTORY:   Social History   Tobacco Use   Smoking status:  Former Smoker    Packs/day: 1.00    Years: 40.00    Pack years: 40.00    Types: Cigarettes    Quit date: 07/21/2006    Years since quitting: 13.5   Smokeless tobacco: Never Used  Vaping Use   Vaping Use: Former  Substance Use Topics   Alcohol use: Not Currently   Drug use: No    ALLERGIES:  has No Known Allergies.  MEDICATIONS:  Current Outpatient Medications  Medication Sig Dispense Refill   acetaminophen (TYLENOL) 500 MG tablet Take 1,000 mg by mouth every 8 (eight) hours as needed for mild pain.     acyclovir (ZOVIRAX) 400 MG tablet Take 1 tablet (400 mg total) by mouth 2 (two) times daily. 60 tablet 3   albuterol (PROVENTIL HFA;VENTOLIN HFA) 108 (90 Base) MCG/ACT inhaler Inhale 2 puffs into the lungs every 6 (six) hours as needed for wheezing or shortness of breath. 1 Inhaler 11   amiodarone (PACERONE) 200 MG tablet Take 1/2 (one-half) tablet by mouth twice daily 90  tablet 3   apixaban (ELIQUIS) 5 MG TABS tablet Take 5 mg by mouth 2 (two) times daily.     atorvastatin (LIPITOR) 40 MG tablet Take 40 mg by mouth daily. Start back slowly with 20 mg working up on slow titration to 26m per Dr GRockey Situ     budesonide-formoterol (SYMBICORT) 160-4.5 MCG/ACT inhaler Inhale 2 puffs into the lungs 2 (two) times daily. 1 Inhaler 11   cetirizine (ZYRTEC) 10 MG tablet Take 10 mg by mouth daily as needed for allergies.      Coenzyme Q10 (COQ10) 200 MG CAPS Take 200 mg by mouth daily.     dexamethasone (DECADRON) 4 MG tablet Start 2 days prior to infusion; Take for 2 days. Do not take on the day of infusion. 60 tablet 3   ezetimibe (ZETIA) 10 MG tablet Take 1 tablet (10 mg total) by mouth daily. 90 tablet 3   furosemide (LASIX) 20 MG tablet Take 1 tablet (20 mg) by mouth twice daily as needed     Krill Oil 350 MG CAPS Take 350 mg by mouth as needed.      metoprolol tartrate (LOPRESSOR) 25 MG tablet Take 25 mg by mouth 2 (two) times daily.     mirtazapine (REMERON) 15 MG tablet  Take 15 mg by mouth at bedtime as needed (for panic associated with PTSD).      montelukast (SINGULAIR) 10 MG tablet Take 1 tablet (10 mg total) by mouth at bedtime. Start 2 days prior to infusion. Take it for 4 days. 60 tablet 0   Multiple Vitamin (MULTIVITAMIN WITH MINERALS) TABS tablet Take 1 tablet by mouth daily.     mupirocin ointment (BACTROBAN) 2 % Apply 1 application topically 3 (three) times daily. 30 g 1   nitroGLYCERIN (NITROSTAT) 0.4 MG SL tablet Place 1 tablet (0.4 mg total) under the tongue every 5 (five) minutes as needed for chest pain. 25 tablet 6   tiotropium (SPIRIVA) 18 MCG inhalation capsule Place 1 capsule (18 mcg total) into inhaler and inhale at bedtime. 30 capsule 11   traMADol (ULTRAM) 50 MG tablet Take 1 tablet (50 mg total) by mouth every 12 (twelve) hours as needed. 60 tablet 0   venetoclax (VENCLEXTA) 100 MG TABS Take 200 mg by mouth daily. (Take 2 tablets daily) (Patient taking differently: Take 100 mg by mouth daily. ) 60 tablet 6   No current facility-administered medications for this visit.    PHYSICAL EXAMINATION: ECOG PERFORMANCE STATUS: 1 - Symptomatic but completely ambulatory  BP 127/67 (BP Location: Left Arm, Patient Position: Sitting, Cuff Size: Large)    Pulse 64    Temp 98.3 F (36.8 C) (Oral)    Resp 16    Ht _0  (1.753 m)    Wt 218 lb (98.9 kg)    SpO2 100%    BMI 32.19 kg/m   Filed Weights   01/19/20 0950  Weight: 218 lb (98.9 kg)    Physical Exam Constitutional:      Comments: Patient is alone.  HENT:     Head: Normocephalic and atraumatic.     Mouth/Throat:     Pharynx: No oropharyngeal exudate.  Eyes:     Pupils: Pupils are equal, round, and reactive to light.  Cardiovascular:     Rate and Rhythm: Normal rate and regular rhythm.  Pulmonary:     Effort: No respiratory distress.     Breath sounds: No wheezing.  Abdominal:     General: Bowel sounds are normal.  There is no distension.     Palpations: Abdomen is soft. There  is no mass.     Tenderness: There is no abdominal tenderness. There is no guarding or rebound.  Musculoskeletal:        General: No tenderness. Normal range of motion.     Cervical back: Normal range of motion and neck supple.  Lymphadenopathy:     Comments: Resolved lymph nodes in the neck underarms.  Skin:    General: Skin is warm.  Neurological:     Mental Status: He is alert and oriented to person, place, and time.  Psychiatric:        Mood and Affect: Affect normal.     LABORATORY DATA:  I have reviewed the data as listed    Component Value Date/Time   NA 142 01/19/2020 0935   NA 139 10/11/2014 1800   K 4.2 01/19/2020 0935   K 3.3 (L) 10/11/2014 1800   CL 103 01/19/2020 0935   CL 106 10/11/2014 1800   CO2 29 01/19/2020 0935   CO2 27 10/11/2014 1800   GLUCOSE 124 (H) 01/19/2020 0935   GLUCOSE 107 (H) 10/11/2014 1800   BUN 21 01/19/2020 0935   BUN 15 10/11/2014 1800   CREATININE 1.02 01/19/2020 0935   CREATININE 0.89 10/11/2014 1800   CALCIUM 9.0 01/19/2020 0935   CALCIUM 8.8 (L) 10/11/2014 1800   PROT 6.8 01/19/2020 0935   PROT 6.7 05/18/2017 1048   PROT 6.4 (L) 10/11/2014 1800   ALBUMIN 4.1 01/19/2020 0935   ALBUMIN 4.3 05/18/2017 1048   ALBUMIN 4.1 10/11/2014 1800   AST 16 01/19/2020 0935   AST 23 10/11/2014 1800   ALT 19 01/19/2020 0935   ALT 22 10/11/2014 1800   ALKPHOS 97 01/19/2020 0935   ALKPHOS 61 10/11/2014 1800   BILITOT 1.7 (H) 01/19/2020 0935   BILITOT 0.7 05/18/2017 1048   BILITOT 0.9 10/11/2014 1800   GFRNONAA >60 01/19/2020 0935   GFRNONAA >60 10/11/2014 1800   GFRAA >60 01/19/2020 0935   GFRAA >60 10/11/2014 1800    No results found for: SPEP, UPEP  Lab Results  Component Value Date   WBC 8.2 01/19/2020   NEUTROABS 4.5 01/19/2020   HGB 12.3 (L) 01/19/2020   HCT 35.9 (L) 01/19/2020   MCV 99.2 01/19/2020   PLT 124 (L) 01/19/2020      Chemistry      Component Value Date/Time   NA 142 01/19/2020 0935   NA 139 10/11/2014 1800    K 4.2 01/19/2020 0935   K 3.3 (L) 10/11/2014 1800   CL 103 01/19/2020 0935   CL 106 10/11/2014 1800   CO2 29 01/19/2020 0935   CO2 27 10/11/2014 1800   BUN 21 01/19/2020 0935   BUN 15 10/11/2014 1800   CREATININE 1.02 01/19/2020 0935   CREATININE 0.89 10/11/2014 1800      Component Value Date/Time   CALCIUM 9.0 01/19/2020 0935   CALCIUM 8.8 (L) 10/11/2014 1800   ALKPHOS 97 01/19/2020 0935   ALKPHOS 61 10/11/2014 1800   AST 16 01/19/2020 0935   AST 23 10/11/2014 1800   ALT 19 01/19/2020 0935   ALT 22 10/11/2014 1800   BILITOT 1.7 (H) 01/19/2020 0935   BILITOT 0.7 05/18/2017 1048   BILITOT 0.9 10/11/2014 1800       RADIOGRAPHIC STUDIES: I have personally reviewed the radiological images as listed and agreed with the findings in the report. No results found.   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  STABLE  FEB 2021- CT-continued partial response.  Stable.     # Currently on Venoclax 100 mg [sec to diarrhea; DI with Amio]; continue at current dose.  Will order imaging today.   # Diarrhea-G-1; Sec to [employer]  # A.fib-on amiodarone [drug interaction-amiodarone] sinus rhythm-on Eliquis-stable  # Joint pain-sec to Ventoclax-continue tramadol prn; stable  DISPOSITION:  # follow up in 45month MD; labs- cbc/cmp/ldh;priro-CT N/C/A/P- Dr.B    Orders Placed This Encounter  Procedures   CT SOFT TISSUE NECK W CONTRAST    Standing Status:   Standing    Number of Occurrences:   1    Order Specific Question:   ** REASON FOR EXAM (FREE TEXT)    Answer:   CLL    Order Specific Question:   Does the patient have a contrast media/X-ray dye allergy?    Answer:   No    Order Specific Question:   If indicated for the ordered procedure, I authorize the administration of contrast media per Radiology protocol    Answer:   Yes    Order  Specific Question:   Radiology Contrast Protocol - do NOT remove file path    Answer:   [email or web address]   CT Chest W Contrast    Standing Status:   Future    Standing Expiration Date:   01/18/2021    Order Specific Question:   ** REASON FOR EXAM (FREE TEXT)    Answer:   CLL    Order Specific Question:   If indicated for the ordered procedure, I authorize the administration of contrast media per Radiology protocol    Answer:   Yes    Order Specific Question:   Preferred imaging location?    Answer:   [facility]    Order Specific Question:   Radiology Contrast Protocol - do NOT remove file path    Answer:   [email or web address]   CT SOFT TISSUE NECK W CONTRAST    Standing Status:   Future    Standing Expiration Date:   01/18/2021    Order Specific Question:   ** REASON FOR EXAM (FREE TEXT)    Answer:   CLL    Order Specific Question:   If indicated for the ordered procedure, I authorize the administration of contrast media per Radiology protocol    Answer:   Yes    Order Specific Question:   Preferred imaging location?    Answer:   [facility]    Order Specific Question:   Radiology Contrast Protocol - do NOT remove file path    Answer:   [email or web address]   CT ABDOMEN PELVIS W CONTRAST    Standing Status:   Future    Standing Expiration Date:   01/18/2021    Order Specific Question:   If indicated for the ordered procedure, I authorize the administration of contrast media per Radiology protocol    Answer:   Yes    Order Specific Question:   Preferred imaging location?    Answer:   [facility]    Order Specific Question:   Radiology Contrast Protocol - do NOT remove file path    Answer:   [email or web address]    Order Specific Question:   ** REASON FOR EXAM (FREE TEXT)    Answer:   CLL   All questions were answered. The patient knows to call the clinic with  any  problems, questions or concerns.      Cammie Sickle, MD 01/22/2020 9:12 AM

## 2020-01-24 NOTE — Telephone Encounter (Signed)
I called and spoke with the patient and he is okay with a referral to pulmonary to get fitted for a new mask.  Sharay Bellissimo,cma

## 2020-01-24 NOTE — Telephone Encounter (Signed)
Referral placed.

## 2020-02-03 ENCOUNTER — Other Ambulatory Visit: Payer: Self-pay

## 2020-02-03 ENCOUNTER — Emergency Department: Payer: No Typology Code available for payment source

## 2020-02-03 ENCOUNTER — Observation Stay
Admission: EM | Admit: 2020-02-03 | Discharge: 2020-02-04 | Disposition: A | Discharge status: Home / Self Care | Payer: No Typology Code available for payment source | Attending: Internal Medicine | Admitting: Internal Medicine

## 2020-02-03 ENCOUNTER — Encounter: Payer: Self-pay | Admitting: Internal Medicine

## 2020-02-03 ENCOUNTER — Observation Stay: Payer: No Typology Code available for payment source

## 2020-02-03 DIAGNOSIS — Z7901 Long term (current) use of anticoagulants: Secondary | ICD-10-CM | POA: Insufficient documentation

## 2020-02-03 DIAGNOSIS — F32A Depression, unspecified: Secondary | ICD-10-CM | POA: Diagnosis present

## 2020-02-03 DIAGNOSIS — J441 Chronic obstructive pulmonary disease with (acute) exacerbation: Secondary | ICD-10-CM | POA: Diagnosis present

## 2020-02-03 DIAGNOSIS — I4891 Unspecified atrial fibrillation: Secondary | ICD-10-CM | POA: Insufficient documentation

## 2020-02-03 DIAGNOSIS — Z20822 Contact with and (suspected) exposure to covid-19: Secondary | ICD-10-CM | POA: Diagnosis not present

## 2020-02-03 DIAGNOSIS — I2510 Atherosclerotic heart disease of native coronary artery without angina pectoris: Secondary | ICD-10-CM | POA: Diagnosis present

## 2020-02-03 DIAGNOSIS — Z8673 Personal history of transient ischemic attack (TIA), and cerebral infarction without residual deficits: Secondary | ICD-10-CM | POA: Diagnosis not present

## 2020-02-03 DIAGNOSIS — I480 Paroxysmal atrial fibrillation: Secondary | ICD-10-CM | POA: Diagnosis not present

## 2020-02-03 DIAGNOSIS — R079 Chest pain, unspecified: Principal | ICD-10-CM | POA: Diagnosis present

## 2020-02-03 DIAGNOSIS — J449 Chronic obstructive pulmonary disease, unspecified: Secondary | ICD-10-CM | POA: Insufficient documentation

## 2020-02-03 DIAGNOSIS — Z79899 Other long term (current) drug therapy: Secondary | ICD-10-CM | POA: Insufficient documentation

## 2020-02-03 DIAGNOSIS — I110 Hypertensive heart disease with heart failure: Secondary | ICD-10-CM | POA: Insufficient documentation

## 2020-02-03 DIAGNOSIS — D696 Thrombocytopenia, unspecified: Secondary | ICD-10-CM | POA: Diagnosis present

## 2020-02-03 DIAGNOSIS — I25118 Atherosclerotic heart disease of native coronary artery with other forms of angina pectoris: Secondary | ICD-10-CM

## 2020-02-03 DIAGNOSIS — C9110 Chronic lymphocytic leukemia of B-cell type not having achieved remission: Secondary | ICD-10-CM | POA: Diagnosis not present

## 2020-02-03 DIAGNOSIS — Z9989 Dependence on other enabling machines and devices: Secondary | ICD-10-CM

## 2020-02-03 DIAGNOSIS — I5032 Chronic diastolic (congestive) heart failure: Secondary | ICD-10-CM | POA: Diagnosis not present

## 2020-02-03 DIAGNOSIS — Z87891 Personal history of nicotine dependence: Secondary | ICD-10-CM | POA: Insufficient documentation

## 2020-02-03 DIAGNOSIS — G4733 Obstructive sleep apnea (adult) (pediatric): Secondary | ICD-10-CM

## 2020-02-03 DIAGNOSIS — T148XXA Other injury of unspecified body region, initial encounter: Secondary | ICD-10-CM

## 2020-02-03 DIAGNOSIS — E785 Hyperlipidemia, unspecified: Secondary | ICD-10-CM | POA: Diagnosis present

## 2020-02-03 DIAGNOSIS — I509 Heart failure, unspecified: Secondary | ICD-10-CM

## 2020-02-03 DIAGNOSIS — G459 Transient cerebral ischemic attack, unspecified: Secondary | ICD-10-CM | POA: Diagnosis present

## 2020-02-03 DIAGNOSIS — I10 Essential (primary) hypertension: Secondary | ICD-10-CM | POA: Diagnosis present

## 2020-02-03 LAB — BASIC METABOLIC PANEL
Anion gap: 5 (ref 5–15)
BUN: 16 mg/dL (ref 8–23)
CO2: 27 mmol/L (ref 22–32)
Calcium: 8.5 mg/dL — ABNORMAL LOW (ref 8.9–10.3)
Chloride: 110 mmol/L (ref 98–111)
Creatinine, Ser: 0.92 mg/dL (ref 0.61–1.24)
GFR calc Af Amer: >60 mL/min (ref >60)
GFR calc non Af Amer: >60 mL/min (ref >60)
Glucose, Bld: 119 mg/dL — ABNORMAL HIGH (ref 70–99)
Potassium: 3.9 mmol/L (ref 3.5–5.1)
Sodium: 142 mmol/L (ref 135–145)

## 2020-02-03 LAB — TROPONIN I (HIGH SENSITIVITY)
Troponin I (High Sensitivity): 10 ng/L (ref <18)
Troponin I (High Sensitivity): 10 ng/L (ref <18)
Troponin I (High Sensitivity): 11 ng/L (ref <18)
Troponin I (High Sensitivity): 12 ng/L (ref <18)
Troponin I (High Sensitivity): 12 ng/L (ref <18)

## 2020-02-03 LAB — CBC
HCT: 32.3 % — ABNORMAL LOW (ref 39.0–52.0)
Hemoglobin: 11.3 g/dL — ABNORMAL LOW (ref 13.0–17.0)
MCH: 34 pg (ref 26.0–34.0)
MCHC: 35 g/dL (ref 30.0–36.0)
MCV: 97.3 fL (ref 80.0–100.0)
Platelets: 129 10*3/uL — ABNORMAL LOW (ref 150–400)
RBC: 3.32 MIL/uL — ABNORMAL LOW (ref 4.22–5.81)
RDW: 14.2 % (ref 11.5–15.5)
WBC: 6.5 10*3/uL (ref 4.0–10.5)
nRBC: 0 % (ref 0.0–0.2)

## 2020-02-03 LAB — SARS CORONAVIRUS 2 BY RT PCR (HOSPITAL ORDER, PERFORMED IN ~~LOC~~ HOSPITAL LAB): SARS Coronavirus 2: NEGATIVE

## 2020-02-03 LAB — BRAIN NATRIURETIC PEPTIDE: B Natriuretic Peptide: 111.1 pg/mL — ABNORMAL HIGH (ref 0.0–100.0)

## 2020-02-03 IMAGING — CR DG CHEST 2V
2 series · 2 of 2 positions shown · non-contrast
Comparison: PA and lateral chest [DATE]. CT chest, abdomen and
pelvis [DATE].

CLINICAL DATA: Onset chest pain, shortness of breath and weakness
2-3 hours ago.

EXAM:
CHEST - 2 VIEW

[chest pa]
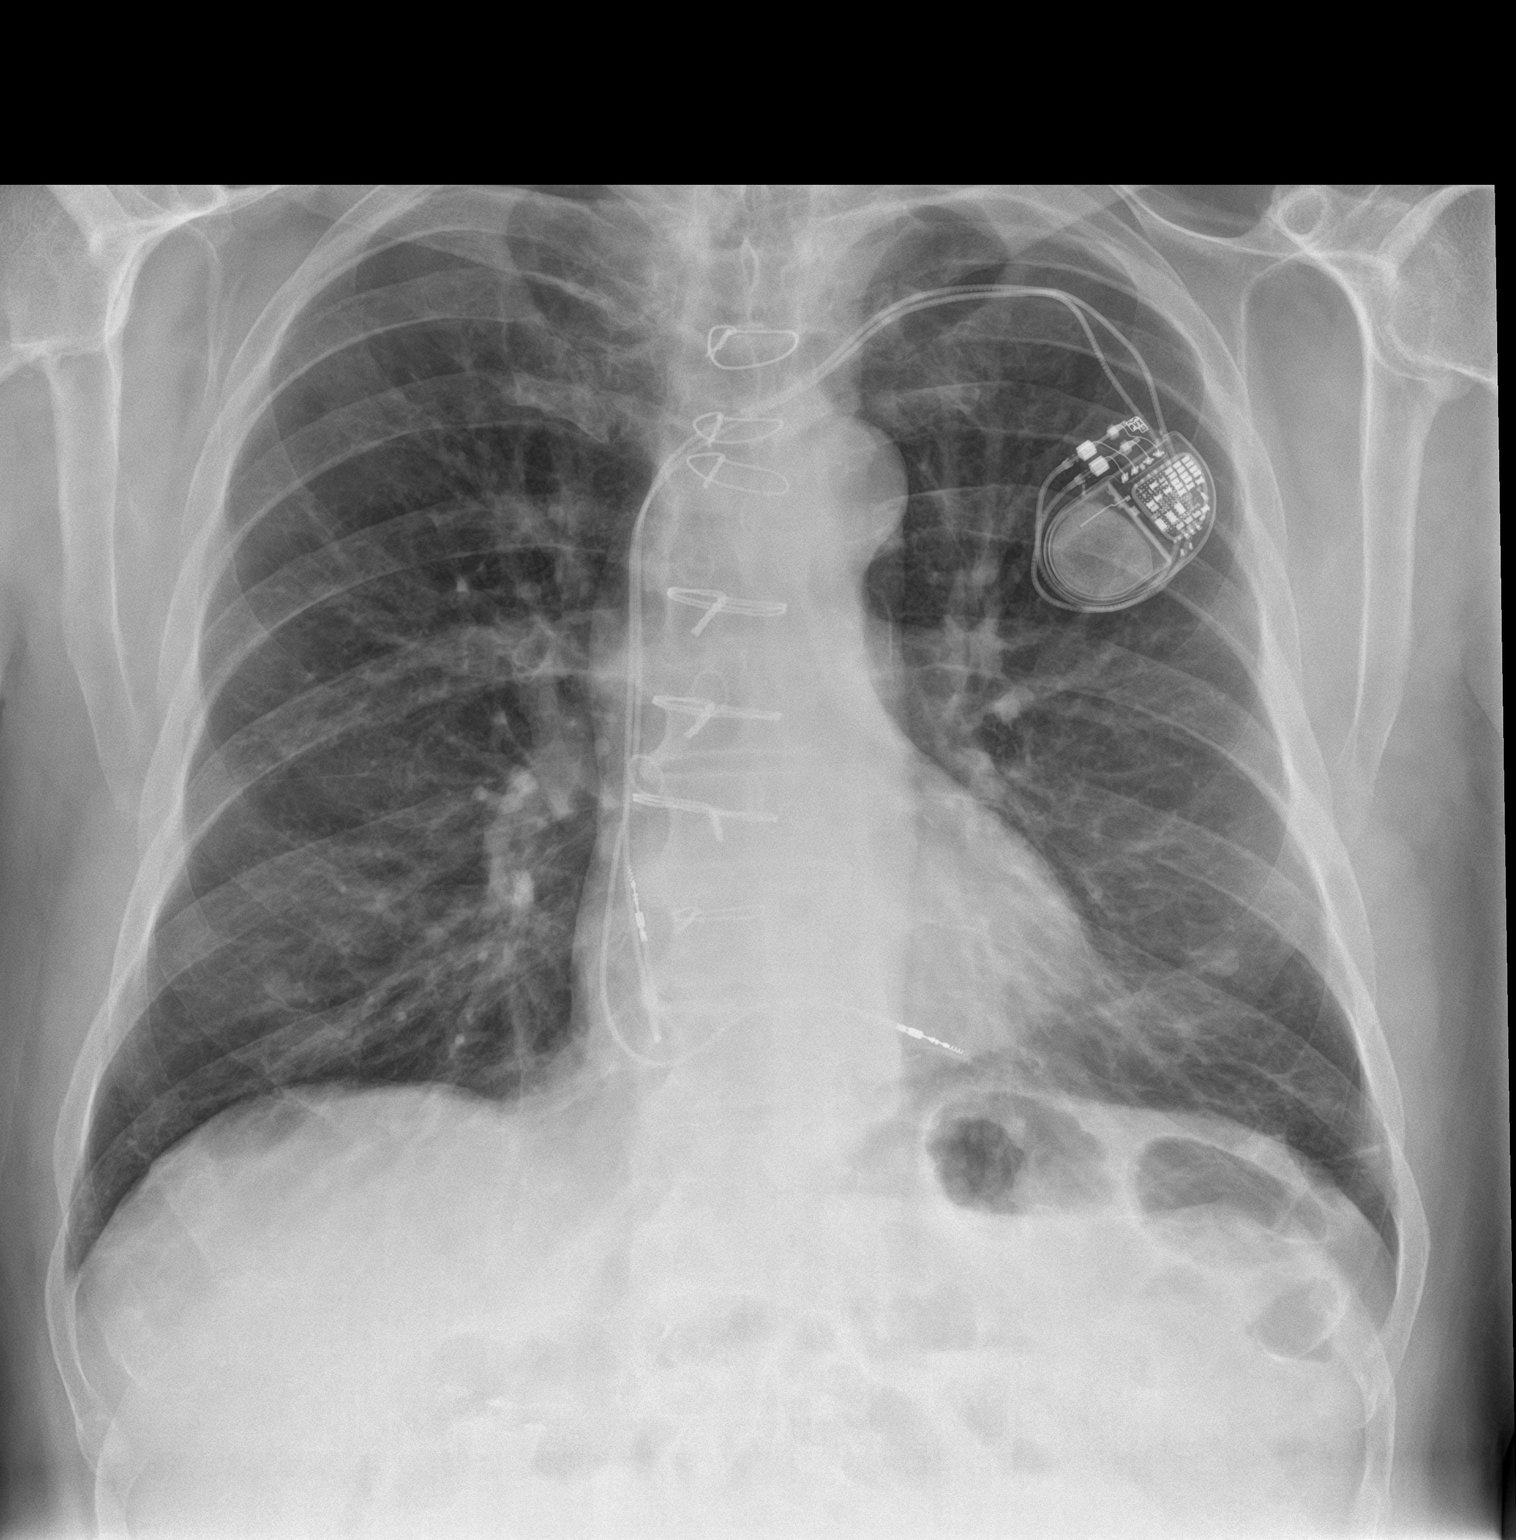

[chest lat]
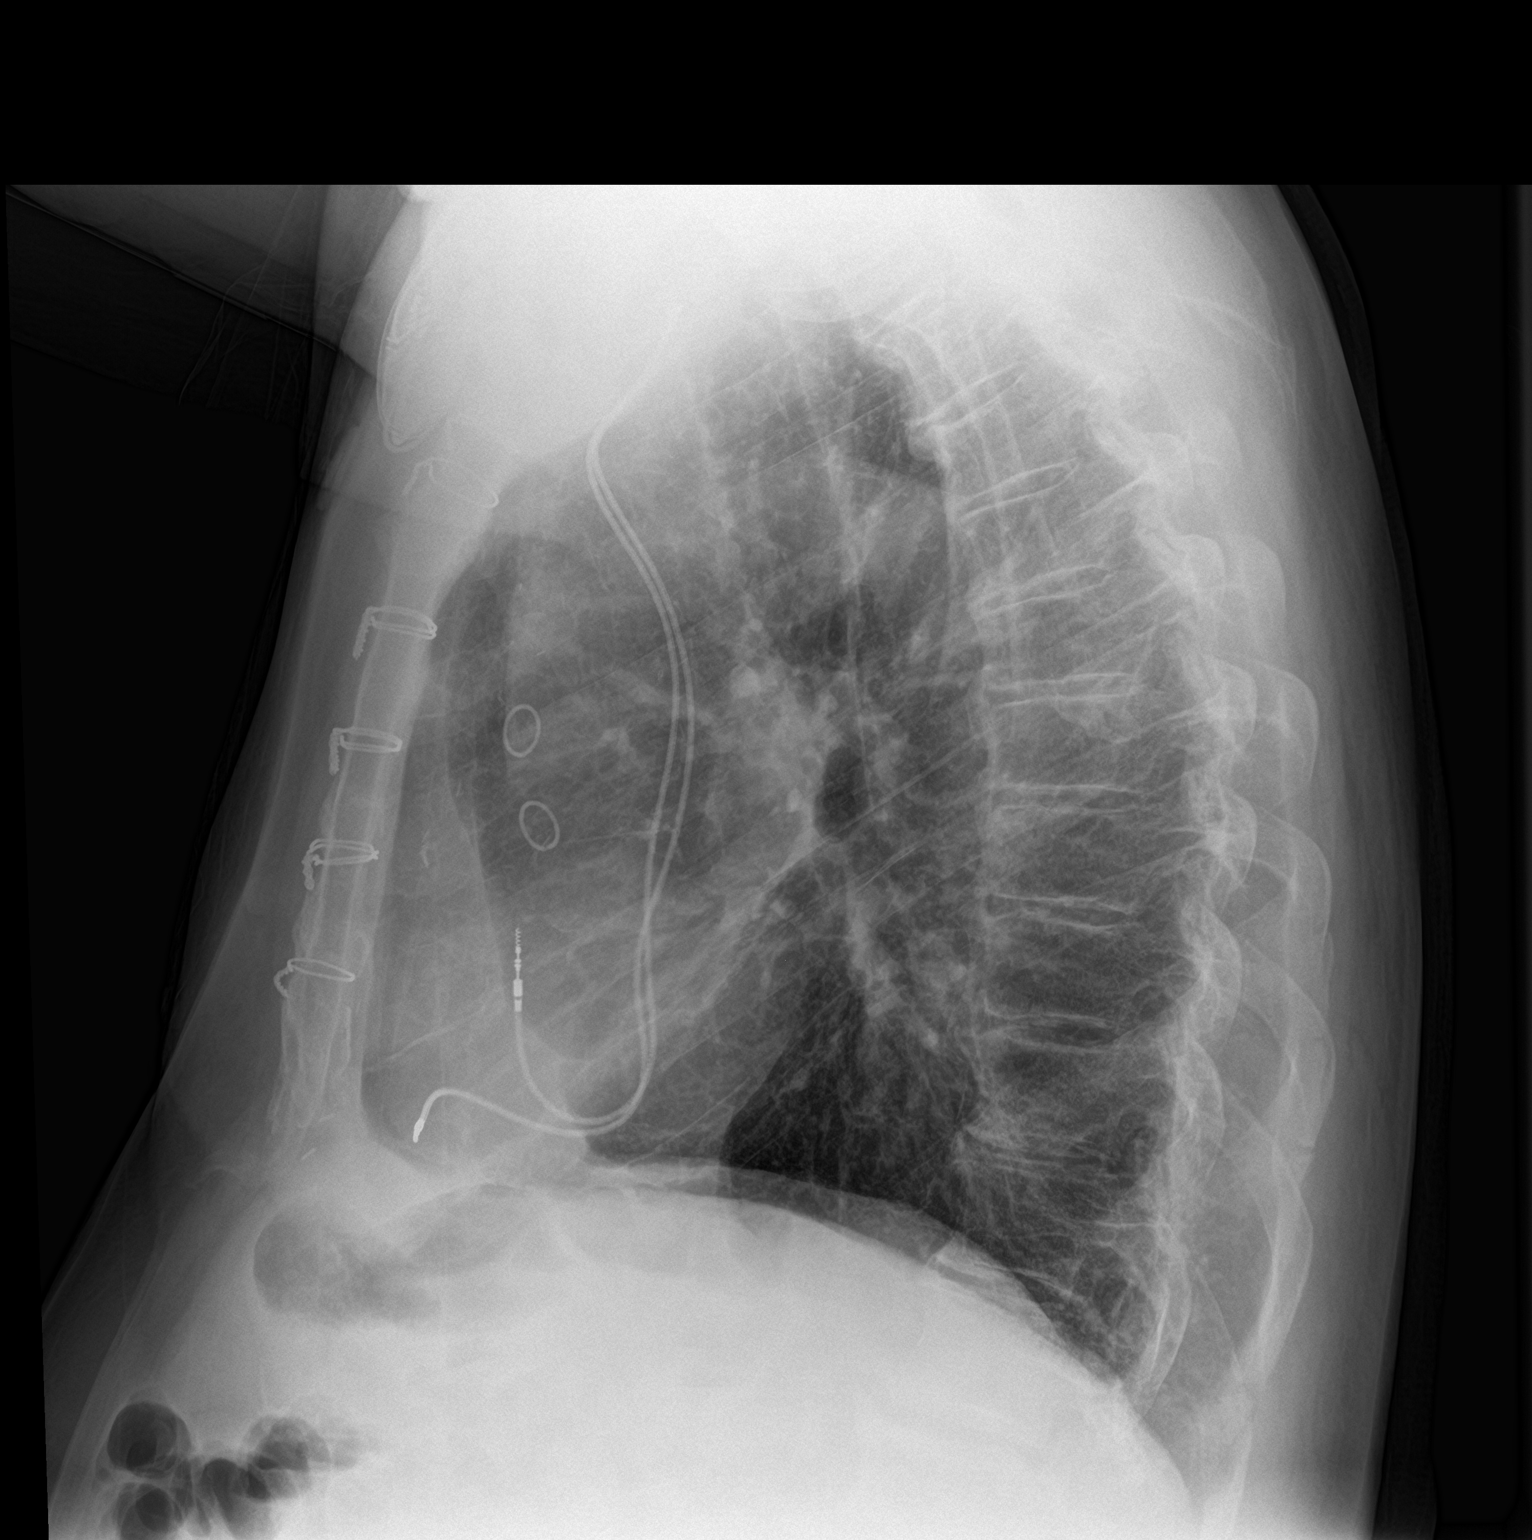

[2 of 2 positions shown; findings below may reference images not displayed]

FINDINGS: The patient is status post CABG with a pacing device in place.
Aortic atherosclerosis. Heart size normal. Lungs clear. Nipple
shadows unchanged. No pneumothorax or pleural effusion.
IMPRESSION: No acute disease.

Aortic Atherosclerosis ([LW]-[LW]).

## 2020-02-03 IMAGING — CT CT ANGIO CHEST-ABD-PELV FOR DISSECTION W/ AND WO/W CM
2 of 7 series · 13 of 46 positions shown, 15 images · non-contrast
Comparison: Three/[DATE]

CLINICAL DATA: Chest pain, abdominal pain

EXAM:
CT ANGIOGRAPHY CHEST, ABDOMEN AND PELVIS
TECHNIQUE: Non-contrast CT of the chest was initially obtained.

[Series 5: axial arterial · axial · arterial · 0.80mm/px · z∈[-507,+102]mm · 10 of 235 slices shown, 12 images]
[im 16/235  soft-tissue]
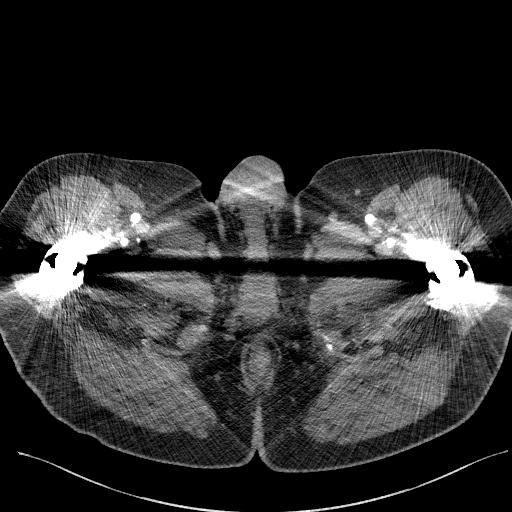
[im 16/235  bone]
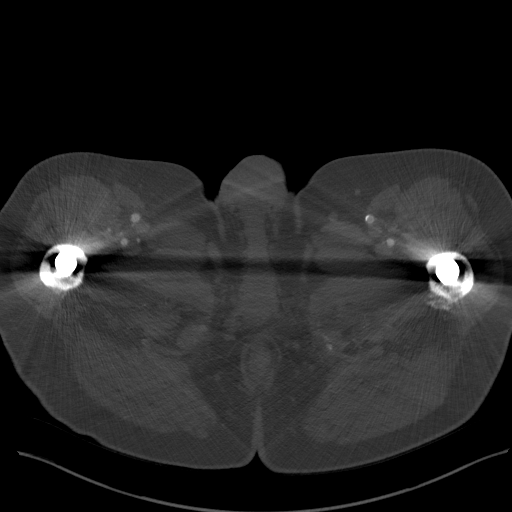
[im 47/235  soft-tissue]
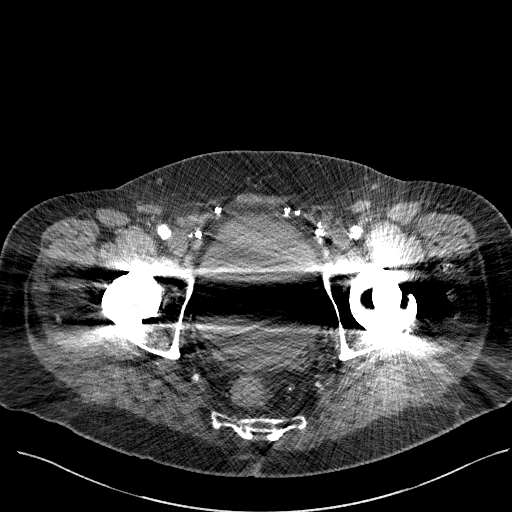
[im 79/235  soft-tissue]
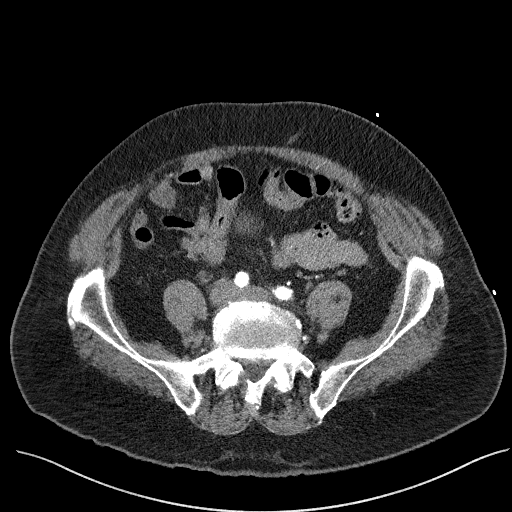
[im 94/235  soft-tissue]
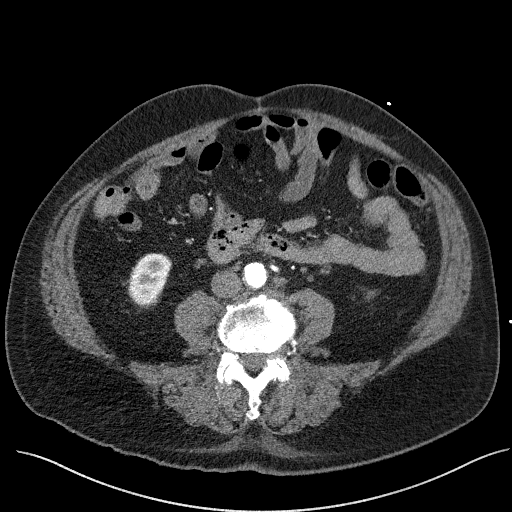
[im 110/235  soft-tissue]
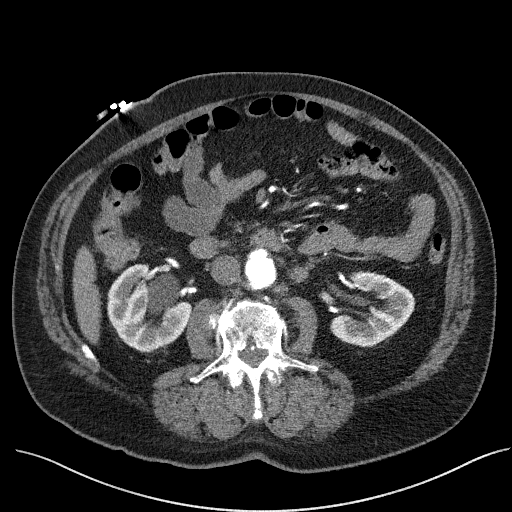
[im 141/235  soft-tissue]
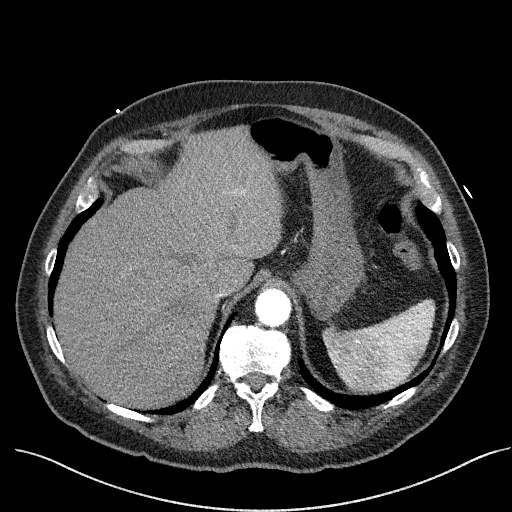
[im 157/235  soft-tissue]
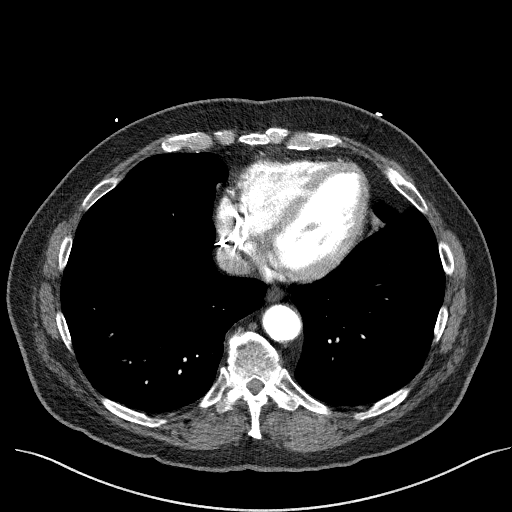
[im 172/235  soft-tissue]
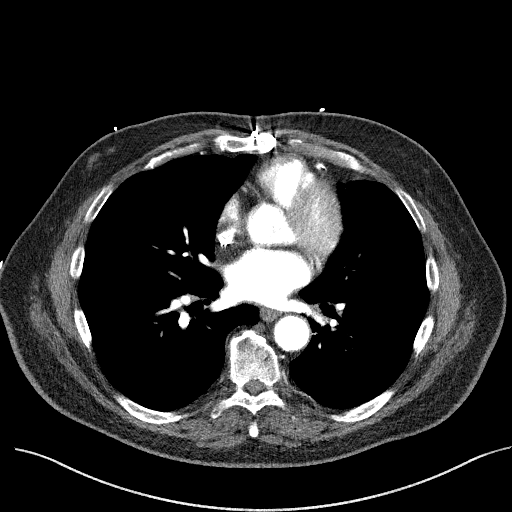
[im 203/235  soft-tissue]
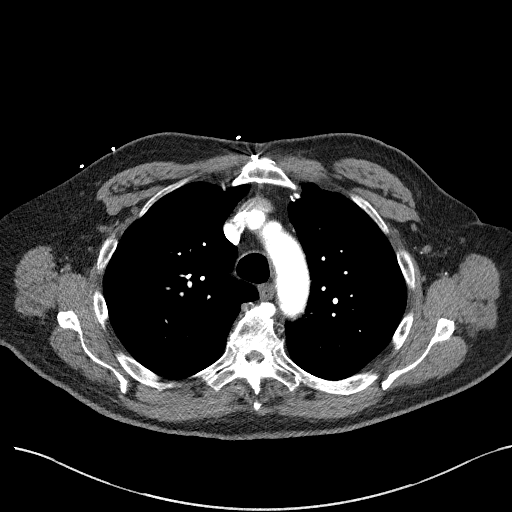
[im 203/235  bone]
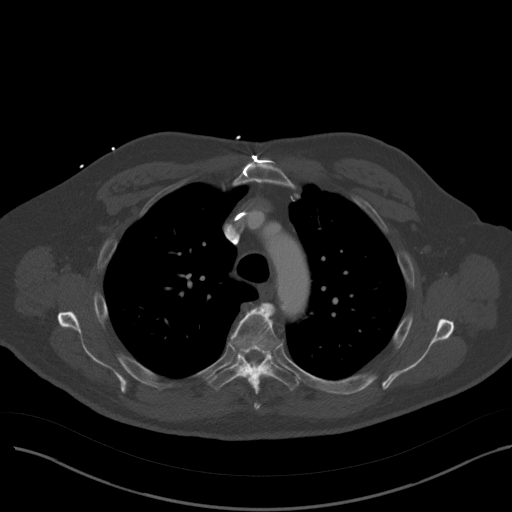
[im 219/235  soft-tissue]
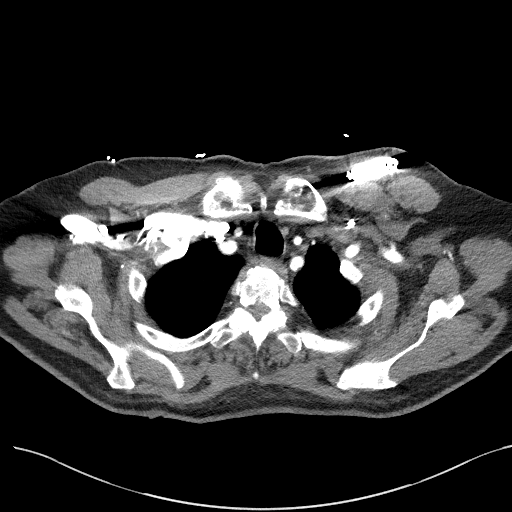

[Series 8: coronals · coronal · 0.90mm/px · 3 of 157 slices shown]
[im 40/157  soft-tissue]
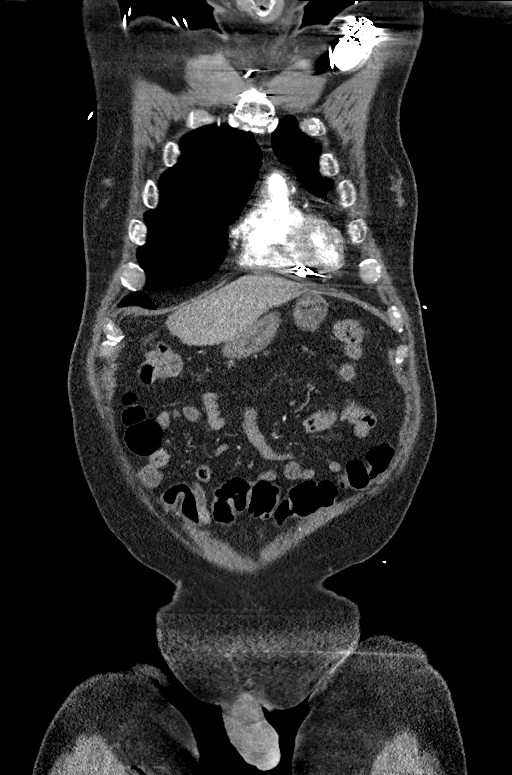
[im 79/157  soft-tissue]
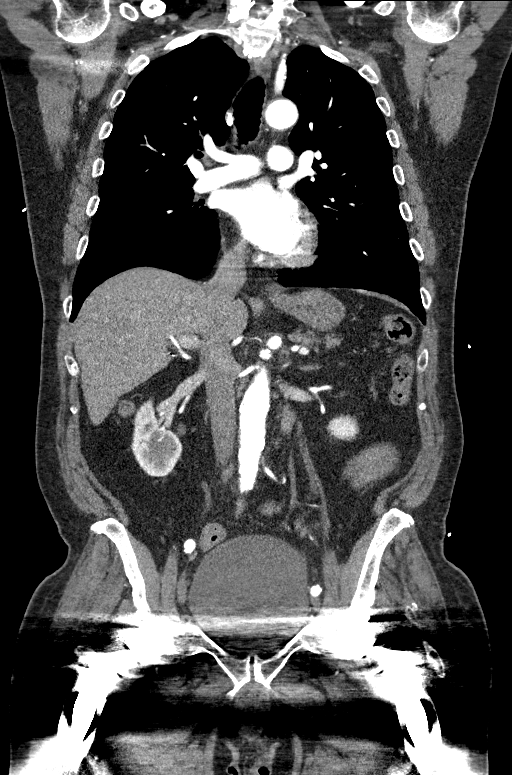
[im 118/157  soft-tissue]
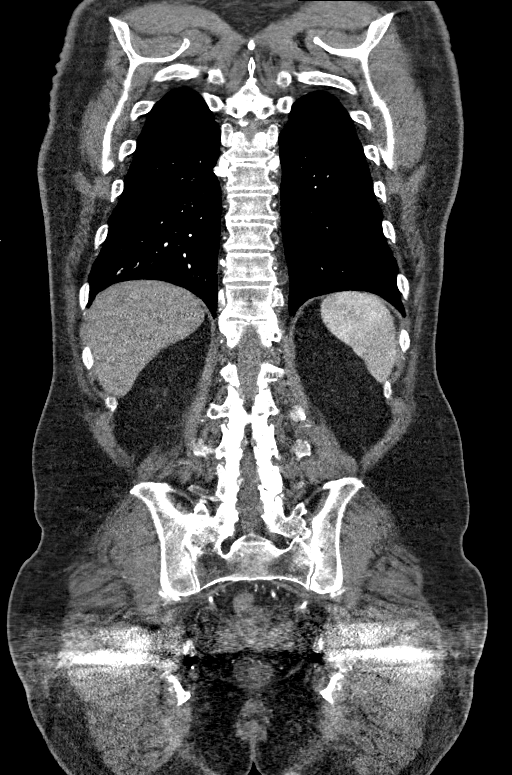

[13 of 46 positions shown; findings below may reference images not displayed]

Multidetector CT imaging through the chest, abdomen and pelvis was
performed using the standard protocol during bolus administration of
intravenous contrast. Multiplanar reconstructed images and MIPs were
obtained and reviewed to evaluate the vascular anatomy.

CONTRAST:  100mL OMNIPAQUE IOHEXOL 350 MG/ML SOLN
FINDINGS: CTA CHEST FINDINGS

Cardiovascular: Coronary artery bypass grafting has been performed.
The thoracic aorta is normal in course and caliber. No evidence of
aneurysm, intramural hematoma, or dissection. Minimal
atherosclerotic calcification within the aortic arch and descending
thoracic aorta. Arch vasculature demonstrates normal anatomic
configuration and is widely patent proximally.

Extensive multivessel native coronary artery calcification. Global
cardiac size within normal limits. Left subclavian dual lead
pacemaker is seen with leads within the right atrium and right
ventricle toward the apex. Aortic valve is tricuspid. No pericardial
effusion. Central pulmonary arteries are of normal caliber.

Mediastinum/Nodes: No pathologic thoracic adenopathy.

Lungs/Pleura: The lungs are symmetrically well expanded. There are
subtle subpleural interstitial infiltrates within the a dependent
lower lobes bilaterally which are nonspecific. If chronic, these can
be seen in the setting of smoking related lung disease (desquamative
interstitial pneumonia), collagen vascular disease, or drug
toxicity. These are extremely mild in severity. No confluent
pulmonary infiltrates. No pneumothorax or pleural effusion. The
central airways are widely patent.

Musculoskeletal: Advanced degenerative changes are seen within the
thoracic spine. No acute bone abnormality.

Review of the MIP images confirms the above findings.

CTA ABDOMEN AND PELVIS FINDINGS

VASCULAR

Aorta: Penetrating atherosclerotic ulcer within the infrarenal
segment, axial image # 126/5, results in mild aneurysm of the a
infrarenal abdominal aorta with maximal dimensions of 2.4 x 2.7 cm.
Moderate scattered atherosclerotic calcification. No dissection.

Celiac: Fusiform proximal aneurysm measuring 11 mm in greatest
dimension.

SMA: Unremarkable

Renals: Dual right renal arteries. Single left renal artery. Widely
patent.

IMA: Patent.

Inflow: Mild calcified atherosclerotic plaque. No aneurysm. No
hemodynamically significant stenosis. Internal iliac arteries are
patent bilaterally.

Veins: Poorly opacified, but morphologically unremarkable.

Review of the MIP images confirms the above findings.

NON-VASCULAR

Hepatobiliary: Status post cholecystectomy. Liver unremarkable. No
intra or extrahepatic biliary ductal dilation.

Pancreas: Unremarkable

Spleen: Unremarkable

Adrenals/Urinary Tract: Adrenal glands are unremarkable. Right
parapelvic cyst noted. The kidneys are otherwise unremarkable. The
bladder is moderately distended, but is otherwise unremarkable.

Stomach/Bowel: Mild sigmoid diverticulosis. Stomach, small bowel,
and large bowel are otherwise unremarkable. Appendix not visualized
and likely absent. No free intraperitoneal gas or fluid.

Lymphatic: There is shotty left periaortic lymphadenopathy present
which has improved since prior examination and is compatible with
the residua of treated disease. No frankly pathologic adenopathy
within the abdomen and pelvis.

Reproductive: Obscured by streak artifact from bilateral hip
prostheses.

Other: Bilateral inguinal hernia repair with mesh has been
performed.

Musculoskeletal: Bilateral total hip arthroplasty has been
performed. Degenerative changes are seen within the lumbar spine. No
acute bone abnormality.

Review of the MIP images confirms the above findings.
IMPRESSION: No evidence of thoracoabdominal aortic dissection or thoracic aortic
aneurysm. Penetrating atherosclerotic ulcer within the infrarenal
abdominal aorta resulting in mild aneurysmal dilation. Ectatic
abdominal aorta at risk for aneurysm development. Recommend followup
by ultrasound in 5 years. This recommendation follows ACR consensus
guidelines: White Paper of the ACR Incidental Findings Committee II
on Vascular Findings. [HOSPITAL] [F6]; [DATE].

Aortic aneurysm NOS ([F6]-[F6])

Marked interval improvement in retroperitoneal adenopathy in keeping
with treated disease. No residual pathologic adenopathy identified.

Aortic Atherosclerosis ([F6]-[F6]).

## 2020-02-03 MED ORDER — ONDANSETRON HCL 4 MG/2ML IJ SOLN: 4.0000 mg | Freq: Three times a day (TID) | INTRAMUSCULAR | Status: DC | PRN

## 2020-02-03 MED ORDER — ATORVASTATIN CALCIUM 20 MG PO TABS
40.0000 mg | ORAL_TABLET | Freq: Every day | ORAL | Status: DC
  Administered 2020-02-04: 40 mg via ORAL
  Filled 2020-02-03: qty 2

## 2020-02-03 MED ORDER — MIRTAZAPINE 15 MG PO TABS: 15.0000 mg | ORAL_TABLET | Freq: Every evening | ORAL | Status: DC | PRN

## 2020-02-03 MED ORDER — APIXABAN 5 MG PO TABS
5.0000 mg | ORAL_TABLET | Freq: Two times a day (BID) | ORAL | Status: DC
  Administered 2020-02-03 – 2020-02-04 (×2): 5 mg via ORAL
  Filled 2020-02-03 (×2): qty 1

## 2020-02-03 MED ORDER — LORATADINE 10 MG PO TABS
10.0000 mg | ORAL_TABLET | Freq: Every day | ORAL | Status: DC
  Administered 2020-02-04: 10 mg via ORAL
  Filled 2020-02-03: qty 1

## 2020-02-03 MED ORDER — NITROGLYCERIN 0.4 MG SL SUBL
0.4000 mg | SUBLINGUAL_TABLET | SUBLINGUAL | Status: DC | PRN
  Administered 2020-02-03: 0.4 mg via SUBLINGUAL
  Filled 2020-02-03: qty 1

## 2020-02-03 MED ORDER — MELOXICAM 7.5 MG PO TABS
7.5000 mg | ORAL_TABLET | Freq: Every day | ORAL | Status: DC
  Administered 2020-02-04: 7.5 mg via ORAL
  Filled 2020-02-03 (×2): qty 1

## 2020-02-03 MED ORDER — METOPROLOL TARTRATE 25 MG PO TABS
12.5000 mg | ORAL_TABLET | Freq: Once | ORAL | Status: DC
  Filled 2020-02-03: qty 1

## 2020-02-03 MED ORDER — COQ10 200 MG PO CAPS: 200.0000 mg | ORAL_CAPSULE | Freq: Every day | ORAL | Status: DC

## 2020-02-03 MED ORDER — ACETAMINOPHEN 325 MG PO TABS: 650.0000 mg | ORAL_TABLET | Freq: Four times a day (QID) | ORAL | Status: DC | PRN

## 2020-02-03 MED ORDER — TRAMADOL HCL 50 MG PO TABS
50.0000 mg | ORAL_TABLET | Freq: Four times a day (QID) | ORAL | Status: DC | PRN
  Administered 2020-02-04: 50 mg via ORAL
  Filled 2020-02-03: qty 1

## 2020-02-03 MED ORDER — ACYCLOVIR 200 MG PO CAPS
400.0000 mg | ORAL_CAPSULE | Freq: Two times a day (BID) | ORAL | Status: DC
  Administered 2020-02-03 – 2020-02-04 (×2): 400 mg via ORAL
  Filled 2020-02-03 (×3): qty 2

## 2020-02-03 MED ORDER — VENETOCLAX 100 MG PO TABS: 100.0000 mg | ORAL_TABLET | Freq: Every day | ORAL | Status: DC

## 2020-02-03 MED ORDER — CYCLOBENZAPRINE HCL 5 MG PO TABS
7.5000 mg | ORAL_TABLET | Freq: Three times a day (TID) | ORAL | Status: DC | PRN
  Administered 2020-02-03: 7.5 mg via ORAL
  Filled 2020-02-03 (×2): qty 1.5

## 2020-02-03 MED ORDER — AMIODARONE HCL 200 MG PO TABS
100.0000 mg | ORAL_TABLET | Freq: Two times a day (BID) | ORAL | Status: DC
  Administered 2020-02-03 – 2020-02-04 (×2): 100 mg via ORAL
  Filled 2020-02-03 (×2): qty 1

## 2020-02-03 MED ORDER — MOMETASONE FURO-FORMOTEROL FUM 200-5 MCG/ACT IN AERO
2.0000 | INHALATION_SPRAY | Freq: Two times a day (BID) | RESPIRATORY_TRACT | Status: DC
  Administered 2020-02-03 – 2020-02-04 (×2): 2 via RESPIRATORY_TRACT
  Filled 2020-02-03: qty 8.8

## 2020-02-03 MED ORDER — ALBUTEROL SULFATE (2.5 MG/3ML) 0.083% IN NEBU: 2.5000 mg | INHALATION_SOLUTION | Freq: Four times a day (QID) | RESPIRATORY_TRACT | Status: DC | PRN

## 2020-02-03 MED ORDER — SODIUM CHLORIDE 0.9 % IV BOLUS
500.0000 mL | Freq: Once | INTRAVENOUS | Status: AC
  Administered 2020-02-03: 500 mL via INTRAVENOUS

## 2020-02-03 MED ORDER — METOPROLOL TARTRATE 25 MG PO TABS
25.0000 mg | ORAL_TABLET | Freq: Two times a day (BID) | ORAL | Status: DC
  Administered 2020-02-03: 25 mg via ORAL
  Filled 2020-02-03: qty 1

## 2020-02-03 MED ORDER — NITROGLYCERIN 0.4 MG SL SUBL: 0.4000 mg | SUBLINGUAL_TABLET | SUBLINGUAL | Status: DC | PRN

## 2020-02-03 MED ORDER — EZETIMIBE 10 MG PO TABS
10.0000 mg | ORAL_TABLET | Freq: Every day | ORAL | Status: DC
  Administered 2020-02-04: 10 mg via ORAL
  Filled 2020-02-03: qty 1

## 2020-02-03 MED ORDER — ADULT MULTIVITAMIN W/MINERALS CH
1.0000 | ORAL_TABLET | Freq: Every day | ORAL | Status: DC
  Administered 2020-02-04: 1 via ORAL
  Filled 2020-02-03: qty 1

## 2020-02-03 MED ORDER — MORPHINE SULFATE (PF) 2 MG/ML IV SOLN: 2.0000 mg | INTRAVENOUS | Status: DC | PRN

## 2020-02-03 MED ORDER — ASPIRIN 81 MG PO CHEW
162.0000 mg | CHEWABLE_TABLET | Freq: Once | ORAL | Status: AC
  Administered 2020-02-03: 162 mg via ORAL
  Filled 2020-02-03: qty 2

## 2020-02-03 MED ORDER — IOHEXOL 350 MG/ML SOLN
100.0000 mL | Freq: Once | INTRAVENOUS | Status: AC | PRN
  Administered 2020-02-03: 100 mL via INTRAVENOUS

## 2020-02-03 NOTE — ED Notes (Signed)
Patient taken to CT scan.

## 2020-02-03 NOTE — ED Provider Notes (Signed)
[facility] Emergency Department Provider Note  ____________________________________________  Time seen: Approximately 12:55 PM  I have reviewed the triage vital signs and the nursing notes.   HISTORY  Chief Complaint Chest Pain    HPI [name] is a 74 y.o. male with a history of atrial fibrillation, COPD, CAD status post CABG and permanent pacemaker insertion, who comes the ED complaining of chest pain that started at about 9:00 AM today.  Worse with walking, better with sitting at rest of a chair.  Feels sharp, central chest, nonradiating.  Moderate intensity.  Associated with malaise and fatigue.  Reports baseline chronic shortness of breath and dyspnea on exertion.  No cough or fever.      Past Medical History:  Diagnosis Date  . Anxiety   . Arthritis   . Atrial fibrillation ([place])    a. Dx 2013, recurred 02/2014, CHA2DS2VASc = 3 -->placed on Eliquis;  b. 02/2014 Echo: EF 50-55%, mid and apical anterior septum and mid and apical inf septum are abnl, mild to mod Ao sclerosis w/o AS.  Marland Kitchen Cancer associated pain   . Chicken pox   . Chronic lymphocytic leukemia ([place])    a. Dx 02/2014.  Marland Kitchen CLL (chronic lymphocytic leukemia) ([place])   . Complication of anesthesia    History of  PTSD--do not touch patient when waking up from surgery.  Marland Kitchen COPD (chronic obstructive pulmonary disease) ([place])   . Coronary artery disease    a. 04/2009 CABG x 3 (LIMA->LAD, VG->OM1, VG->PDA);  b. 09/2009 Cath: occluded VG x 2 w/ patent LIMA and L->R collats. EF 55%, mild antlat HK;  c. 10/2011 MV: EF 53%, no isch/infarct-->low risk.  Marland Kitchen Dysrhythmia    hx of a-fib  . GERD (gastroesophageal reflux disease)    occasional  . History of chemotherapy 2015-2016  . HOH (hard of hearing)    Bilateral Hearing Aids  . Hypertension   . Myocardial infarction ([place]) 2010  . OSA on CPAP    USE C-PAP  . Presence of permanent cardiac pacemaker 2017  . PTSD (post-traumatic stress disorder)   .  PTSD (post-traumatic stress disorder)   . Pure hypercholesterolemia   . Rheumatic fever 1959  . 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 TIA (transient ischemic attack) 11/02/2015     Patient Active Problem List   Diagnosis Date Noted  . CAD (coronary artery disease)   . Depression   . Atrial fibrillation ([place])   . Arthritis 09/16/2019  . Elevated glucose 05/13/2019  . Dehydration 03/14/2019  . Sick sinus syndrome ([place]) 07/08/2016  . Goals of care, counseling/discussion 07/08/2016  . 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 Chronic fatigue   . Thrombocytopenia ([place]) 02/29/2016  . 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 PAD (peripheral artery disease) ([place])   . OSA on CPAP   . TIA (transient ischemic attack) 11/02/2015  . Chronic diastolic CHF (congestive heart failure) ([place]) 09/20/2015  . CLL (chronic lymphocytic leukemia) ([place])   . PTSD (post-traumatic stress disorder)   . Chest pain 08/22/2015  . Osteoarthritis of both knees 07/05/2015  . COPD (chronic obstructive pulmonary disease) ([place]) 01/01/2012  . Shortness of breath 10/09/2011  . Paroxysmal atrial fibrillation ([place]) 10/09/2011  . Hyperlipidemia 11/28/2009  . Essential hypertension 11/28/2009     Past Surgical History:  Procedure Laterality Date  . ABDOMINAL HERNIA REPAIR    . APPENDECTOMY  06/21/1985  . CARDIAC CATHETERIZATION  2010; 2011   ; Dr Fletcher Anon  . CORONARY ARTERY BYPASS GRAFT  04/2009   "CABG X3"  . EP IMPLANTABLE DEVICE N/A 03/03/2016   Procedure: Pacemaker Implant;  Surgeon: Deboraha Sprang, MD;  Location: [place] [facility];  Service: Cardiovascular;  Laterality: N/A;  . FOREIGN BODY REMOVAL  1968   "shrapnel in my tailbone"  . INGUINAL HERNIA REPAIR Right   . INSERT / REPLACE / REMOVE PACEMAKER    . JOINT REPLACEMENT Right 2018  . LAPAROSCOPIC CHOLECYSTECTOMY    . TONSILLECTOMY AND ADENOIDECTOMY  1956  . TOTAL HIP ARTHROPLASTY Right 10/22/2016    Procedure: TOTAL HIP ARTHROPLASTY;  Surgeon: Dereck Leep, MD;  Location: [facility];  Service: Orthopedics;  Laterality: Right;  . TOTAL HIP ARTHROPLASTY Left 11/04/2017   Procedure: TOTAL HIP ARTHROPLASTY;  Surgeon: Dereck Leep, MD;  Location: [facility];  Service: Orthopedics;  Laterality: Left;     Prior to Admission medications   Medication Sig Start Date End Date Taking? Authorizing Provider  acetaminophen (TYLENOL) 500 MG tablet Take 1,000 mg by mouth every 8 (eight) hours as needed for mild pain.   Yes [provider]  acyclovir (ZOVIRAX) 400 MG tablet Take 1 tablet (400 mg total) by mouth 2 (two) times daily. 11/23/19  Yes Cammie Sickle, MD  albuterol (PROVENTIL HFA;VENTOLIN HFA) 108 (90 Base) MCG/ACT inhaler Inhale 2 puffs into the lungs every 6 (six) hours as needed for wheezing or shortness of breath. 12/01/16  Yes Wilhelmina Mcardle, MD  amiodarone (PACERONE) 200 MG tablet Take 1/2 (one-half) tablet by mouth twice daily 01/13/20  Yes Gollan, Kathlene November, MD  apixaban (ELIQUIS) 5 MG TABS tablet Take 5 mg by mouth 2 (two) times daily.   Yes [provider]  atorvastatin (LIPITOR) 40 MG tablet Take 40 mg by mouth daily. Start back slowly with 20 mg working up on slow titration to 40mg  per Dr Rockey Situ.   Yes [provider]  budesonide-formoterol (SYMBICORT) 160-4.5 MCG/ACT inhaler Inhale 2 puffs into the lungs 2 (two) times daily. 12/01/16  Yes Wilhelmina Mcardle, MD  cetirizine (ZYRTEC) 10 MG tablet Take 10 mg by mouth daily as needed for allergies.    Yes [provider]  Coenzyme Q10 (COQ10) 200 MG CAPS Take 200 mg by mouth daily.   Yes [provider]  ezetimibe (ZETIA) 10 MG tablet Take 1 tablet (10 mg total) by mouth daily. 01/06/17 11/01/27 Yes Gollan, Kathlene November, MD  furosemide (LASIX) 20 MG tablet Take 1 tablet (20 mg) by mouth twice daily as needed    Yes [provider]  Javier Docker Oil 350 MG CAPS Take 350 mg by mouth as needed.    Yes  [provider]  metoprolol tartrate (LOPRESSOR) 25 MG tablet Take 25 mg by mouth 2 (two) times daily.    Yes [provider]  mirtazapine (REMERON) 15 MG tablet Take 15 mg by mouth at bedtime as needed (for panic associated with PTSD).    Yes [provider]  Multiple Vitamin (MULTIVITAMIN WITH MINERALS) TABS tablet Take 1 tablet by mouth daily.   Yes [provider]  nitroGLYCERIN (NITROSTAT) 0.4 MG SL tablet Place 1 tablet (0.4 mg total) under the tongue every 5 (five) minutes as needed for chest pain. 01/06/17  Yes Gollan, Kathlene November, MD  traMADol (ULTRAM) 50 MG tablet Take 1 tablet (50 mg total) by mouth every 12 (twelve) hours as needed. 11/24/19  Yes Cammie Sickle, MD  venetoclax (VENCLEXTA) 100 MG TABS Take 200 mg by mouth daily. (Take 2 tablets daily) Patient taking differently: Take 100 mg by mouth daily.  11/14/19  Yes Cammie Sickle, MD     Allergies Patient has no known allergies.   Family History  Problem Relation Age of Onset  . Heart disease Mother   . Heart attack Mother   . Coronary artery disease Other        family history    Social History Social History   Tobacco Use  . Smoking status: Former Smoker    Packs/day: 1.00    Years: 40.00    Pack years: 40.00    Types: Cigarettes    Quit date: 07/21/2006    Years since quitting: 13.5  . Smokeless tobacco: Never Used  Vaping Use  . Vaping Use: Former  Substance Use Topics  . Alcohol use: Not Currently  . Drug use: No    Review of Systems  Constitutional:   No fever or chills.  ENT:   No sore throat. No rhinorrhea. Cardiovascular:   Positive chest pain as above without syncope. Respiratory:   No dyspnea or cough. Gastrointestinal:   Negative for abdominal pain, vomiting and diarrhea.  Musculoskeletal:   Negative for focal pain or swelling All other systems reviewed and are negative except as documented above in ROS and  HPI.  ____________________________________________   PHYSICAL EXAM:  VITAL SIGNS: ED Triage Vitals  Enc Vitals Group     BP 02/03/20 1212 (!) 153/82     Pulse Rate 02/03/20 1212 (!) 59     Resp 02/03/20 1212 20     Temp 02/03/20 1212 98.6 F (37 C)     Temp Source 02/03/20 1212 Oral     SpO2 02/03/20 1212 98 %     Weight 02/03/20 1213 217 lb (98.4 kg)     Height 02/03/20 1213 5\' 8"  (1.727 m)     Head Circumference --      Peak Flow --      Pain Score 02/03/20 1212 7     Pain Loc --      Pain Edu? --      Excl. in [place]? --     Vital signs reviewed, nursing assessments reviewed.   Constitutional:   Alert and oriented. Non-toxic appearance. Eyes:   Conjunctivae are normal. EOMI. PERRL. ENT      Head:   Normocephalic and atraumatic.      Nose:   Wearing a mask.      Mouth/Throat:   Wearing a mask.      Neck:   No meningismus. Full ROM. Hematological/Lymphatic/Immunilogical:   No cervical lymphadenopathy. Cardiovascular:   RRR. Symmetric bilateral radial and DP pulses.  No murmurs. Cap refill less than 2 seconds. Respiratory:   Normal respiratory effort without tachypnea/retractions. Breath sounds are clear and equal bilaterally. No wheezes/rales/rhonchi. Gastrointestinal:   Soft and nontender. Non distended. There is no CVA tenderness.  No rebound, rigidity, or guarding. Musculoskeletal:   Normal range of motion in all extremities. No joint effusions.  No lower extremity tenderness.  No edema. Neurologic:   Normal speech and language.  Motor grossly intact. No acute focal neurologic deficits are appreciated.  Skin:    Skin is warm, dry and intact. No rash noted.  No petechiae, purpura, or bullae.  ____________________________________________    LABS (pertinent positives/negatives) (all labs ordered are listed, but only abnormal results are displayed) Labs Reviewed  BASIC METABOLIC PANEL - Abnormal; Notable  for the following components:      Result Value   Glucose,  Bld 119 (*)    Calcium 8.5 (*)    All other components within normal limits  CBC - Abnormal; Notable for the following components:   RBC 3.32 (*)    Hemoglobin 11.3 (*)    HCT 32.3 (*)    Platelets 129 (*)    All other components within normal limits  SARS CORONAVIRUS 2 BY RT PCR (HOSPITAL ORDER, [facility])  BRAIN NATRIURETIC PEPTIDE  TROPONIN I (HIGH SENSITIVITY)  TROPONIN I (HIGH SENSITIVITY)  TROPONIN I (HIGH SENSITIVITY)   ____________________________________________   EKG  Interpreted by me Atrial paced rhythm, rate of 61.  Normal axis, first-degree AV block.  Normal QRS and ST segments.  Isolated T wave inversion in V2, nonspecific.  Compared to previous EKG on Dec 05, 2019 there are no acute changes.  ____________________________________________    RADIOLOGY  DG Chest 2 View  Result Date: 02/03/2020 CLINICAL DATA:  Onset chest pain, shortness of breath and weakness 2-3 hours ago. EXAM: CHEST - 2 VIEW COMPARISON:  PA and lateral chest 01/18/2018. CT chest, abdomen and pelvis 08/29/2019. FINDINGS: The patient is status post CABG with a pacing device in place. Aortic atherosclerosis. Heart size normal. Lungs clear. Nipple shadows unchanged. No pneumothorax or pleural effusion. IMPRESSION: No acute disease. Aortic Atherosclerosis (ICD10-I70.0). Electronically Signed   By: Inge Rise M.D.   On: 02/03/2020 14:00    ____________________________________________   PROCEDURES Procedures  ____________________________________________  DIFFERENTIAL DIAGNOSIS   Non-STEMI, pulmonary edema, dehydration, electrolyte abnormality, pneumothorax, doubt PE  CLINICAL IMPRESSION / ASSESSMENT AND PLAN / ED COURSE  Medications ordered in the ED: Medications  nitroGLYCERIN (NITROSTAT) SL tablet 0.4 mg (0.4 mg Sublingual Given 02/03/20 1309)  morphine 2 MG/ML injection 2 mg (has no administration in time range)  ondansetron (ZOFRAN) injection 4 mg (has  no administration in time range)  acetaminophen (TYLENOL) tablet 650 mg (has no administration in time range)  aspirin chewable tablet 162 mg (162 mg Oral Given 02/03/20 1307)  sodium chloride 0.9 % bolus 500 mL (0 mLs Intravenous Stopped 02/03/20 1508)    Pertinent labs & imaging results that were available during my care of the patient were reviewed by me and considered in my medical decision making (see chart for details).  [name] was evaluated in Emergency Department on 02/03/2020 for the symptoms described in the history of present illness. He was evaluated in the context of the global COVID-19 pandemic, which necessitated consideration that the patient might be at risk for infection with the SARS-CoV-2 virus that causes COVID-19. Institutional protocols and algorithms that pertain to the evaluation of patients at risk for COVID-19 are in a state of rapid change based on information released by regulatory bodies including the CDC and federal and state organizations. These policies and algorithms were followed during the patient's care in the ED.   Patient presents with chest pain and malaise.  Vital signs and EKG unremarkable.  Exam benign.  Will try sublingual nitroglycerin while obtaining labs and chest x-ray.  Given normal vital signs, no hypoxia or more pronounced shortness of breath, Eliquis use, doubt PE.  Clinical Course as of Feb 03 1543  Fri Feb 03, 2020  1414 Troponin and chest x-ray unremarkable.   [PS]    Clinical Course User Index [PS] Carrie Mew, MD     ----------------------------------------- 3:44 PM on 02/03/2020 -----------------------------------------  No improvement after sublingual  nitroglycerin, continues to have chest pain. Vital signs remained stable. Given underlying cardiac disease, case discussed with the hospitalist for further evaluation and management.  ____________________________________________   FINAL CLINICAL IMPRESSION(S) / ED  DIAGNOSES    Final diagnoses:  Nonspecific chest pain  Atrial fibrillation, unspecified type ([place])  Congestive heart failure, unspecified HF chronicity, unspecified heart failure type [facility])     ED Discharge Orders    None      Portions of this note were generated with dragon dictation software. Dictation errors may occur despite best attempts at proofreading.   Carrie Mew, MD 02/03/20 1544

## 2020-02-03 NOTE — ED Triage Notes (Signed)
Pt arrived via POV with reports of chest pain, shortness of breath. Pt c/o central chest pain radiating throughout entire chest.   Pt states the chest pain started 2-3 hours ago when he went to go sit down in his chair.  Pt has pacemaker and sees Dr. Rockey Situ.  Pt took 1 NTG and took AM meds.

## 2020-02-03 NOTE — ED Notes (Signed)
Unable to call report due to RN not being available. 

## 2020-02-03 NOTE — ED Notes (Signed)
Dr Gollan at bedside °

## 2020-02-03 NOTE — ED Notes (Addendum)
Patient states nitro SL gives him a headache. Patient c/o weakness. Patient denies relief with Nitro SL here or at home. Dr. Joni Fears aware.

## 2020-02-03 NOTE — Progress Notes (Signed)

## 2020-02-03 NOTE — H&P (Signed)
History and Physical    [name] OIN:[id number] DOB: 12/07/1945 DOA: 02/03/2020  Referring MD/NP/PA:   PCP: Leone Haven, MD   Patient coming from:  The patient is coming from home.  At baseline, pt is independent for most of ADL.        Chief Complaint: chest pain and shortness of breath  HPI: [name] is a 74 y.o. male with medical history significant of hyperlipidemia, COPD, TIA, GERD, depression, PTSD, SSS, s/p of pacemaker placement, OSA on CPAP, CAD, CABG, CLL, atrial fibrillation on Eliquis, dCHF, who presents with chest pain shortness of breath.  Patient states that his chest pain started in the early morning at about 9 AM, which is located in the central chest, 7 out of 10 in severity, intermittent, tightness feeling, you are home from chest sometimes, aggravated with exertion.  He also has shortness breath, no cough, fever or chills.  Denies nausea, vomiting, diarrhea, abdominal pain, symptoms of UTI or unilateral weakness.  ED Course: pt was found to have troponin X, pending COVID-19 PCR, electrolytes renal function okay, temperature normal, blood pressure 134/80, heart rate 59, RR 14, oxygen saturation 98% on room air.  Chest x-ray negative.  Pending CT angiogram to rule out a dissection.  Patient is placed on progressive bed for observation.  Review of Systems:   General: no fevers, chills, no body weight gain, has fatigue HEENT: no blurry vision, hearing changes or sore throat Respiratory: has dyspnea, no coughing, wheezing CV: has chest pain, no palpitations GI: no nausea, vomiting, abdominal pain, diarrhea, constipation GU: no dysuria, burning on urination, increased urinary frequency, hematuria  Ext: no leg edema Neuro: no unilateral weakness, numbness, or tingling, no vision change or hearing loss Skin: no rash, no skin tear. MSK: No muscle spasm, no deformity, no limitation of range of movement in spin Heme: No easy bruising.  Travel history: No  recent long distant travel.  Allergy: No Known Allergies  Past Medical History:  Diagnosis Date  . Anxiety   . Arthritis   . Atrial fibrillation ([place])    a. Dx 2013, recurred 02/2014, CHA2DS2VASc = 3 -->placed on Eliquis;  b. 02/2014 Echo: EF 50-55%, mid and apical anterior septum and mid and apical inf septum are abnl, mild to mod Ao sclerosis w/o AS.  Marland Kitchen Cancer associated pain   . Chicken pox   . Chronic lymphocytic leukemia ([place])    a. Dx 02/2014.  Marland Kitchen CLL (chronic lymphocytic leukemia) ([place])   . Complication of anesthesia    History of  PTSD--do not touch patient when waking up from surgery.  Marland Kitchen COPD (chronic obstructive pulmonary disease) ([place])   . Coronary artery disease    a. 04/2009 CABG x 3 (LIMA->LAD, VG->OM1, VG->PDA);  b. 09/2009 Cath: occluded VG x 2 w/ patent LIMA and L->R collats. EF 55%, mild antlat HK;  c. 10/2011 MV: EF 53%, no isch/infarct-->low risk.  Marland Kitchen Dysrhythmia    hx of a-fib  . GERD (gastroesophageal reflux disease)    occasional  . History of chemotherapy 2015-2016  . HOH (hard of hearing)    Bilateral Hearing Aids  . Hypertension   . Myocardial infarction ([place]) 2010  . OSA on CPAP    USE C-PAP  . Presence of permanent cardiac pacemaker 2017  . PTSD (post-traumatic stress disorder)   . PTSD (post-traumatic stress disorder)   . Pure hypercholesterolemia   . Rheumatic fever 1959  . 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  TIA (transient ischemic attack) 11/02/2015    Past Surgical History:  Procedure Laterality Date  . ABDOMINAL HERNIA REPAIR    . APPENDECTOMY  06/21/1985  . CARDIAC CATHETERIZATION  2010; 2011   ; Dr Fletcher Anon  . CORONARY ARTERY BYPASS GRAFT  04/2009   "CABG X3"  . EP IMPLANTABLE DEVICE N/A 03/03/2016   Procedure: Pacemaker Implant;  Surgeon: Deboraha Sprang, MD;  Location: [facility];  Service: Cardiovascular;  Laterality: N/A;  . FOREIGN BODY REMOVAL  1968   "shrapnel in my tailbone"  . INGUINAL HERNIA REPAIR Right    . INSERT / REPLACE / REMOVE PACEMAKER    . JOINT REPLACEMENT Right 2018  . LAPAROSCOPIC CHOLECYSTECTOMY    . TONSILLECTOMY AND ADENOIDECTOMY  1956  . TOTAL HIP ARTHROPLASTY Right 10/22/2016   Procedure: TOTAL HIP ARTHROPLASTY;  Surgeon: Dereck Leep, MD;  Location: [facility];  Service: Orthopedics;  Laterality: Right;  . TOTAL HIP ARTHROPLASTY Left 11/04/2017   Procedure: TOTAL HIP ARTHROPLASTY;  Surgeon: Dereck Leep, MD;  Location: [facility];  Service: Orthopedics;  Laterality: Left;    Social History:  reports that he quit smoking about 13 years ago. His smoking use included cigarettes. He has a 40.00 pack-year smoking history. He has never used smokeless tobacco. He reports previous alcohol use. He reports that he does not use drugs.  Family History:  Family History  Problem Relation Age of Onset  . Heart disease Mother   . Heart attack Mother   . Coronary artery disease Other        family history     Prior to Admission medications   Medication Sig Start Date End Date Taking? Authorizing Provider  acetaminophen (TYLENOL) 500 MG tablet Take 1,000 mg by mouth every 8 (eight) hours as needed for mild pain.   Yes [provider]  acyclovir (ZOVIRAX) 400 MG tablet Take 1 tablet (400 mg total) by mouth 2 (two) times daily. 11/23/19  Yes Cammie Sickle, MD  albuterol (PROVENTIL HFA;VENTOLIN HFA) 108 (90 Base) MCG/ACT inhaler Inhale 2 puffs into the lungs every 6 (six) hours as needed for wheezing or shortness of breath. 12/01/16  Yes Wilhelmina Mcardle, MD  amiodarone (PACERONE) 200 MG tablet Take 1/2 (one-half) tablet by mouth twice daily 01/13/20  Yes Gollan, Kathlene November, MD  apixaban (ELIQUIS) 5 MG TABS tablet Take 5 mg by mouth 2 (two) times daily.   Yes [provider]  atorvastatin (LIPITOR) 40 MG tablet Take 40 mg by mouth daily. Start back slowly with 20 mg working up on slow titration to 40mg  per Dr Rockey Situ.   Yes [provider]  budesonide-formoterol  (SYMBICORT) 160-4.5 MCG/ACT inhaler Inhale 2 puffs into the lungs 2 (two) times daily. 12/01/16  Yes Wilhelmina Mcardle, MD  cetirizine (ZYRTEC) 10 MG tablet Take 10 mg by mouth daily as needed for allergies.    Yes [provider]  Coenzyme Q10 (COQ10) 200 MG CAPS Take 200 mg by mouth daily.   Yes [provider]  ezetimibe (ZETIA) 10 MG tablet Take 1 tablet (10 mg total) by mouth daily. 01/06/17 11/01/27 Yes Gollan, Kathlene November, MD  furosemide (LASIX) 20 MG tablet Take 1 tablet (20 mg) by mouth twice daily as needed    Yes [provider]  Javier Docker Oil 350 MG CAPS Take 350 mg by mouth as needed.    Yes [provider]  metoprolol tartrate (LOPRESSOR) 25 MG tablet Take 25 mg by mouth  2 (two) times daily.    Yes [provider]  mirtazapine (REMERON) 15 MG tablet Take 15 mg by mouth at bedtime as needed (for panic associated with PTSD).    Yes [provider]  Multiple Vitamin (MULTIVITAMIN WITH MINERALS) TABS tablet Take 1 tablet by mouth daily.   Yes [provider]  nitroGLYCERIN (NITROSTAT) 0.4 MG SL tablet Place 1 tablet (0.4 mg total) under the tongue every 5 (five) minutes as needed for chest pain. 01/06/17  Yes Gollan, Kathlene November, MD  traMADol (ULTRAM) 50 MG tablet Take 1 tablet (50 mg total) by mouth every 12 (twelve) hours as needed. 11/24/19  Yes Cammie Sickle, MD  venetoclax (VENCLEXTA) 100 MG TABS Take 200 mg by mouth daily. (Take 2 tablets daily) Patient taking differently: Take 100 mg by mouth daily.  11/14/19  Yes Cammie Sickle, MD    Physical Exam: Vitals:   02/03/20 1337 02/03/20 1338 02/03/20 1341 02/03/20 1516  BP: (!) 169/86 134/80 134/80 (!) 142/78  Pulse: 60 61 (!) 59 (!) 59  Resp: 14 18 14 14   Temp:      TempSrc:      SpO2: 100% 100% 99% 98%  Weight:      Height:       General: Not in acute distress HEENT:       Eyes: PERRL, EOMI, no scleral icterus.       ENT: No discharge from the ears and nose, no  pharynx injection, no tonsillar enlargement.        Neck: No JVD, no bruit, no mass felt. Heme: No neck lymph node enlargement. Cardiac: S1/S2, RRR, No murmurs, No gallops or rubs. Respiratory: No rales, wheezing, rhonchi or rubs. GI: Soft, nondistended, nontender, no rebound pain, no organomegaly, BS present. GU: No hematuria Ext: No pitting leg edema bilaterally. 2+DP/PT pulse bilaterally. Musculoskeletal: No joint deformities, No joint redness or warmth, no limitation of ROM in spin. Skin: No rashes.  Neuro: Alert, oriented X3, cranial nerves II-XII grossly intact, moves all extremities normally.  Psych: Patient is not psychotic, no suicidal or hemocidal ideation.  Labs on Admission: I have personally reviewed following labs and imaging studies  CBC: Recent Labs  Lab 02/03/20 1254  WBC 6.5  HGB 11.3*  HCT 32.3*  MCV 97.3  PLT 229*   Basic Metabolic Panel: Recent Labs  Lab 02/03/20 1254  NA 142  K 3.9  CL 110  CO2 27  GLUCOSE 119*  BUN 16  CREATININE 0.92  CALCIUM 8.5*   GFR: Estimated Creatinine Clearance: 80.1 mL/min (by C-G formula based on SCr of 0.92 mg/dL). Liver Function Tests: No results for input(s): AST, ALT, ALKPHOS, BILITOT, PROT, ALBUMIN in the last 168 hours. No results for input(s): LIPASE, AMYLASE in the last 168 hours. No results for input(s): AMMONIA in the last 168 hours. Coagulation Profile: No results for input(s): INR, PROTIME in the last 168 hours. Cardiac Enzymes: No results for input(s): CKTOTAL, CKMB, CKMBINDEX, TROPONINI in the last 168 hours. BNP (last 3 results) No results for input(s): PROBNP in the last 8760 hours. HbA1C: No results for input(s): HGBA1C in the last 72 hours. CBG: No results for input(s): GLUCAP in the last 168 hours. Lipid Profile: No results for input(s): CHOL, HDL, LDLCALC, TRIG, CHOLHDL, LDLDIRECT in the last 72 hours. Thyroid Function Tests: No results for input(s): TSH, T4TOTAL, FREET4, T3FREE, THYROIDAB  in the last 72 hours. Anemia Panel: No results for input(s): VITAMINB12, FOLATE, FERRITIN, TIBC, IRON, RETICCTPCT  in the last 72 hours. Urine analysis:    Component Value Date/Time   COLORURINE YELLOW (A) 09/16/2018 1252   APPEARANCEUR CLEAR (A) 09/16/2018 1252   APPEARANCEUR Clear 10/11/2014 2003   LABSPEC 1.013 09/16/2018 1252   LABSPEC 1.017 10/11/2014 2003   PHURINE 6.0 09/16/2018 1252   GLUCOSEU NEGATIVE 09/16/2018 1252   GLUCOSEU Negative 10/11/2014 2003   HGBUR MODERATE (A) 09/16/2018 1252   BILIRUBINUR NEGATIVE 09/16/2018 1252   BILIRUBINUR Negative 10/11/2014 2003   KETONESUR NEGATIVE 09/16/2018 1252   PROTEINUR NEGATIVE 09/16/2018 1252   UROBILINOGEN 1.0 05/12/2009 1426   NITRITE NEGATIVE 09/16/2018 1252   LEUKOCYTESUR NEGATIVE 09/16/2018 1252   LEUKOCYTESUR Negative 10/11/2014 2003   Sepsis Labs: @LABRCNTIP (procalcitonin:4,lacticidven:4) )No results found for this or any previous visit (from the past 240 hour(s)).   Radiological Exams on Admission: DG Chest 2 View  Result Date: 02/03/2020 CLINICAL DATA:  Onset chest pain, shortness of breath and weakness 2-3 hours ago. EXAM: CHEST - 2 VIEW COMPARISON:  PA and lateral chest 01/18/2018. CT chest, abdomen and pelvis 08/29/2019. FINDINGS: The patient is status post CABG with a pacing device in place. Aortic atherosclerosis. Heart size normal. Lungs clear. Nipple shadows unchanged. No pneumothorax or pleural effusion. IMPRESSION: No acute disease. Aortic Atherosclerosis (ICD10-I70.0). Electronically Signed   By: Inge Rise M.D.   On: 02/03/2020 14:00     EKG: Independently reviewed.  Paced rhythm, QTC 461, ST depression in precordial leads  Assessment/Plan Principal Problem:   Chest pain Active Problems:   Hyperlipidemia   Essential hypertension   Paroxysmal atrial fibrillation (HCC)   COPD (chronic obstructive pulmonary disease) (HCC)   CLL (chronic lymphocytic leukemia) (HCC)   Chronic diastolic CHF  (congestive heart failure) (HCC)   TIA (transient ischemic attack)   OSA on CPAP   Thrombocytopenia (HCC)   CAD (coronary artery disease)   Depression   Chest pain, hx of CAD: s/p of CBAG. Trop 10. Dr. Rockey Situ of card is consulted  - place to progressive unit for observation - Trend Trop - Repeat EKG in the am  - prn Nitroglycerin, Morphine, lipitor, zetia  - pt received 162 mg of ASA in ED ---> will not continue ASA since pt is on Eliquis - Risk factor stratification: will check FLP and A1C  - 2d echo - f/u CTA to rule out dissection as recommended by card  Hyperlipidemia -Zetia and lipitor  Essential hypertension -IV hydralazine as needed -Metoprolol  Paroxysmal atrial fibrillation (HCC) -Continue Eliquis, amiodarone and metoprolol  COPD (chronic obstructive pulmonary disease) ([place]): Stable -Bronchodilators  CLL (chronic lymphocytic leukemia) ([place]): WBC 6.5, hemoglobin 11.3 -Follow-up with oncology -on Venetoclax  Chronic diastolic CHF (congestive heart failure) ([place]): 2D echo on 11/02/2015 showed EF of 55-60%.  Patient does not have leg edema or JV D.  No pulmonary edema on chest x-ray.  CHF seem to be compensated.  Patient is taking Lasix as needed at home. -Hold Lasix -Check BNP  History of TIA (transient ischemic attack) -Lipitor, Zetia -Patient is on Eliquis for atrial fibrillation  OSA - on CPAP  Chronic thrombocytopenia ([place]): Platelet 129.  No bleeding tendency -Follow-up with CBC  Depression: -Continue home medications: Remeron         DVT ppx: on Eliquis Code Status: Full code Family Communication:   Yes, patient's  Wife  at bed side Disposition Plan:  Anticipate discharge back to previous environment Consults called:  Dr. Rockey Situ Admission status:   progressive unit for obs   Status is:  Observation  The patient remains OBS appropriate and will d/c before 2 midnights.  Dispo: The patient is from: Home              Anticipated d/c is  to: Home              Anticipated d/c date is: 1 day              Patient currently is not medically stable to d/c.           Date of Service 02/03/2020    Ivor Costa [facility]   If 7PM-7AM, please contact night-coverage [email or web address] 02/03/2020, 3:27 PM

## 2020-02-03 NOTE — ED Notes (Signed)
Wife is at bedside. Patient taken to x-ray.

## 2020-02-03 NOTE — Consult Note (Signed)
Cardiology Consultation:   Patient ID: [name] MRN: [id number]; DOB: [birth year]  Admit date: 02/03/2020 Date of Consult: 02/03/2020  Primary Care Provider: Leone Haven, MD Primary Cardiologist: Dr. Rockey Situ Primary Electrophysiologist:  None    Patient Profile:   [name] is a 74 y.o. male with a hx of CAD s/p CABG, atrial fibrillation/sick sinus syndrome s/p PPM ([employer]) 02/2016, previous history of smoking (quit 2009), CLL followed by oncology, who is being seen today for the evaluation of chest pain at the request of Dr. Blaine Hamper.  History of Present Illness:   [name] is a 74 year old male with PMH as above.    He has a history of COPD and previous tobacco use for approximately 40 years.  He quit in 2009.  He has a history of CAD s/p CABG.  He underwent catheterization 09/2009 with occluded vein graft x2, patent LIMA, collaterals from left to right.    09/2011, he was diagnosed with atrial fibrillation. Conversion pause noted on metoprolol.  2017 echo with EF normal 55 to 60%, mild LAE.  He was referred to EP for sick sinus syndrome and s/p PPM 02/2016.  He is followed by EP/Dr. Caryl Comes.  He also has a history of CLL and is followed by oncology.  He reports chronic leg pain from his cancer medication. ABIs normal.   He states he is a fairly active person and stays busy around the house.  Today, 02/03/2020, he reports chest pain that began while busy getting stuff done around the house.  He reports that he went to his pool and was doing stuff there for a while.  He then went to go warm up 2 bottles of milk for his goats.  He got to the microwave and tried to warm up the bottles but realized that his power was out.  This frustrated him, as he recently switched to solar panel energy, and this is the second time that he has had no power.  He and his wife both report a significant amount of stress over trying to get his solar panel energy to work and the financial burden  associated with this.    He reports the chest pain began in the center of his chest and felt as if it was a fullness and also an excruciating sharp pain.  The pain has waxed and waned since its onset.  He denies any significant relief with nitro, reporting that this medication is just given and headache.  He reports that the chest pain feels like a machete in the center of his chest at its worst.  During cardiac consultation, he still reported mild chest pain, described as a nail in the center of his chest.  He reports chronic shortness of breath/DOE, associated with his COPD.  However, he does admit to progressive shortness of breath over the last few days.  He denies any other associated symptoms including palpitations, pnd, n, v, syncope, edema, weight gain, or early satiety.  He reports chronic orthopnea, which is not new for him.   He reports concern today, given that he has never had CP like this before in the past. In addition, he denies any significant sx before his CABG.  In the [facility] ED, vitals significant for BP 153/82, HR 59 bpm, RR 20, 98% on room air.  Labs significant for stable renal function and electrolytes with creatinine 0.92 and BUN 16.  BNP 111.1.  High-sensitivity troponin   10.  EKG without acute ST or  T changes.  Also noted was anemia with hemoglobin 11.3 and hematocrit 32.3.  He denies any signs or symptoms of bleeding.  Chest x-ray without significant findings.  At time of cardiac consultation, and as above, he still reported chest pain that felt as if the nail was in the center of his chest.  Several times during today's cardiac consultation, he is squirming in pain.  He continues to note no relief of chest pain and now feels significantly weak and tired.  IM messaged to obtain CTA.   Heart Pathway Score:     Past Medical History:  Diagnosis Date  . Anxiety   . Arthritis   . Atrial fibrillation ([facility])    a. Dx 2013, recurred 02/2014, CHA2DS2VASc = 3 -->placed on Eliquis;   b. 02/2014 Echo: EF 50-55%, mid and apical anterior septum and mid and apical inf septum are abnl, mild to mod Ao sclerosis w/o AS.  Marland Kitchen Cancer associated pain   . Chicken pox   . Chronic lymphocytic leukemia ([place])    a. Dx 02/2014.  Marland Kitchen CLL (chronic lymphocytic leukemia) ([place])   . Complication of anesthesia    History of  PTSD--do not touch patient when waking up from surgery.  Marland Kitchen COPD (chronic obstructive pulmonary disease) ([place])   . Coronary artery disease    a. 04/2009 CABG x 3 (LIMA->LAD, VG->OM1, VG->PDA);  b. 09/2009 Cath: occluded VG x 2 w/ patent LIMA and L->R collats. EF 55%, mild antlat HK;  c. 10/2011 MV: EF 53%, no isch/infarct-->low risk.  Marland Kitchen Dysrhythmia    hx of a-fib  . GERD (gastroesophageal reflux disease)    occasional  . History of chemotherapy 2015-2016  . HOH (hard of hearing)    Bilateral Hearing Aids  . Hypertension   . Myocardial infarction ([place]) 2010  . OSA on CPAP    USE C-PAP  . Presence of permanent cardiac pacemaker 2017  . PTSD (post-traumatic stress disorder)   . PTSD (post-traumatic stress disorder)   . Pure hypercholesterolemia   . Rheumatic fever 1959  . 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 TIA (transient ischemic attack) 11/02/2015    Past Surgical History:  Procedure Laterality Date  . ABDOMINAL HERNIA REPAIR    . APPENDECTOMY  06/21/1985  . CARDIAC CATHETERIZATION  2010; 2011   ; Dr Fletcher Anon  . CORONARY ARTERY BYPASS GRAFT  04/2009   "CABG X3"  . EP IMPLANTABLE DEVICE N/A 03/03/2016   Procedure: Pacemaker Implant;  Surgeon: Deboraha Sprang, MD;  Location: [facility];  Service: Cardiovascular;  Laterality: N/A;  . FOREIGN BODY REMOVAL  1968   "shrapnel in my tailbone"  . INGUINAL HERNIA REPAIR Right   . INSERT / REPLACE / REMOVE PACEMAKER    . JOINT REPLACEMENT Right 2018  . LAPAROSCOPIC CHOLECYSTECTOMY    . TONSILLECTOMY AND ADENOIDECTOMY  1956  . TOTAL HIP ARTHROPLASTY Right 10/22/2016   Procedure: TOTAL HIP ARTHROPLASTY;   Surgeon: Dereck Leep, MD;  Location: [facility] ORS;  Service: Orthopedics;  Laterality: Right;  . TOTAL HIP ARTHROPLASTY Left 11/04/2017   Procedure: TOTAL HIP ARTHROPLASTY;  Surgeon: Dereck Leep, MD;  Location: [facility] ORS;  Service: Orthopedics;  Laterality: Left;     Home Medications:  Prior to Admission medications   Medication Sig Start Date End Date Taking? Authorizing Provider  acetaminophen (TYLENOL) 500 MG tablet Take 1,000 mg by mouth every 8 (eight) hours as needed for mild pain.   Yes [provider]  acyclovir (ZOVIRAX) 400 MG tablet Take 1 tablet (400 mg total) by mouth 2 (two) times daily. 11/23/19  Yes Cammie Sickle, MD  albuterol (PROVENTIL HFA;VENTOLIN HFA) 108 (90 Base) MCG/ACT inhaler Inhale 2 puffs into the lungs every 6 (six) hours as needed for wheezing or shortness of breath. 12/01/16  Yes Wilhelmina Mcardle, MD  amiodarone (PACERONE) 200 MG tablet Take 1/2 (one-half) tablet by mouth twice daily 01/13/20  Yes Gollan, Kathlene November, MD  apixaban (ELIQUIS) 5 MG TABS tablet Take 5 mg by mouth 2 (two) times daily.   Yes [provider]  atorvastatin (LIPITOR) 40 MG tablet Take 40 mg by mouth daily. Start back slowly with 20 mg working up on slow titration to 40mg  per Dr Rockey Situ.   Yes [provider]  budesonide-formoterol (SYMBICORT) 160-4.5 MCG/ACT inhaler Inhale 2 puffs into the lungs 2 (two) times daily. 12/01/16  Yes Wilhelmina Mcardle, MD  cetirizine (ZYRTEC) 10 MG tablet Take 10 mg by mouth daily as needed for allergies.    Yes [provider]  Coenzyme Q10 (COQ10) 200 MG CAPS Take 200 mg by mouth daily.   Yes [provider]  ezetimibe (ZETIA) 10 MG tablet Take 1 tablet (10 mg total) by mouth daily. 01/06/17 11/01/27 Yes Gollan, Kathlene November, MD  furosemide (LASIX) 20 MG tablet Take 1 tablet (20 mg) by mouth twice daily as needed    Yes [provider]  Javier Docker Oil 350 MG CAPS Take 350 mg by mouth as needed.    Yes [provider]  metoprolol tartrate (LOPRESSOR) 25 MG tablet Take 25 mg by mouth 2 (two) times daily.    Yes [provider]  mirtazapine (REMERON) 15 MG tablet Take 15 mg by mouth at bedtime as needed (for panic associated with PTSD).    Yes [provider]  Multiple Vitamin (MULTIVITAMIN WITH MINERALS) TABS tablet Take 1 tablet by mouth daily.   Yes [provider]  nitroGLYCERIN (NITROSTAT) 0.4 MG SL tablet Place 1 tablet (0.4 mg total) under the tongue every 5 (five) minutes as needed for chest pain. 01/06/17  Yes Gollan, Kathlene November, MD  traMADol (ULTRAM) 50 MG tablet Take 1 tablet (50 mg total) by mouth every 12 (twelve) hours as needed. 11/24/19  Yes Cammie Sickle, MD  venetoclax (VENCLEXTA) 100 MG TABS Take 200 mg by mouth daily. (Take 2 tablets daily) Patient taking differently: Take 100 mg by mouth daily.  11/14/19  Yes Cammie Sickle, MD    Inpatient Medications: Scheduled Meds: . metoprolol tartrate  12.5 mg Oral Once   Continuous Infusions:  PRN Meds: acetaminophen, morphine injection, nitroGLYCERIN, ondansetron (ZOFRAN) IV  Allergies:   No Known Allergies  Social History:   Social History   Socioeconomic History  . Marital status: Married    Spouse name: Not on file  . Number of children: Not on file  . Years of education: Not on file  . Highest education level: Not on file  Occupational History  . 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 Smoking status: Former Smoker    Packs/day: 1.00    Years: 40.00    Pack years: 40.00    Types: Cigarettes    Quit date: 07/21/2006    Years since quitting: 13.5  . Smokeless tobacco: Never Used  Vaping Use  . Vaping Use: Former  Substance and Sexual Activity  .  Alcohol use: Not Currently  . Drug use: No  . Sexual activity: Not Currently  Other Topics Concern  . Not on file  Social History Narrative   Lives in  [place] with wife. Dogs. Goats. Children - 4. Grandchildren - 7.      Worked - Sports coach, part time.   Diet - healthy   Exercise - very active      Service - [employer] - [place]   Social Determinants of Health   Financial Resource Strain:   . Difficulty of Paying Living Expenses:   Food Insecurity:   . Worried About Charity fundraiser in the Last Year:   . Arboriculturist in the Last Year:   Transportation Needs:   . Film/video editor (Medical):   Marland Kitchen Lack of Transportation (Non-Medical):   Physical Activity:   . Days of Exercise per Week:   . Minutes of Exercise per Session:   Stress: No Stress Concern Present  . Feeling of Stress : Not at all  Social Connections:   . Frequency of Communication with Friends and Family:   . Frequency of Social Gatherings with Friends and Family:   . Attends Religious Services:   . Active Member of Clubs or Organizations:   . Attends Archivist Meetings:   Marland Kitchen Marital Status:   Intimate Partner Violence:   . Fear of Current or Ex-Partner:   . Emotionally Abused:   Marland Kitchen Physically Abused:   . Sexually Abused:     Family History:     Family History  Problem Relation Age of Onset  . Heart disease Mother   . Heart attack Mother   . Coronary artery disease Other        family history     ROS:  Please see the history of present illness.  Review of Systems  Constitutional: Positive for malaise/fatigue.  Respiratory: Positive for cough and shortness of breath. Negative for hemoptysis.   Cardiovascular: Positive for chest pain and orthopnea. Negative for palpitations, leg swelling and PND.  Musculoskeletal: Negative for falls.  Neurological: Negative for dizziness and loss of consciousness.    All other ROS reviewed and negative.     Physical Exam/Data:   Vitals:   02/03/20 1337 02/03/20 1338 02/03/20 1341 02/03/20 1516  BP: (!) 169/86 134/80 134/80 (!) 142/78  Pulse: 60 61 (!) 59 (!) 59  Resp: 14 18 14 14   Temp:       TempSrc:      SpO2: 100% 100% 99% 98%  Weight:      Height:        Intake/Output Summary (Last 24 hours) at 02/03/2020 1640 Last data filed at 02/03/2020 1508 Gross per 24 hour  Intake 500 ml  Output --  Net 500 ml   Last 3 Weights 02/03/2020 01/19/2020 01/17/2020  Weight (lbs) 217 lb 218 lb 215 lb 12.8 oz  Weight (kg) 98.431 kg 98.884 kg 97.886 kg     Body mass index is 32.99 kg/m.  General:  Well nourished, well developed, in no acute distress.  Appears in significant pain at times. HEENT: normal Neck: no JVD Vascular: No carotid bruits; radial pulses 2+ bilaterally Cardiac:  normal S1, S2; RRR; no murmur  Lungs: clear to auscultation bilaterally, no wheezing, rhonchi or rales  Abd: soft, nontender, no hepatomegaly  Ext: no significant/pitting lower extremity bilateral edema Musculoskeletal:  No deformities, BUE and BLE strength normal and equal Skin: warm and dry  Neuro:  No  focal abnormalities noted Psych:  Normal affect   EKG:  The EKG was personally reviewed and demonstrates: Paced, 61bpm, nonspecific ST/T changes in precordial leads.  No acute changes from previous EKG. Telemetry:  Telemetry was personally reviewed and demonstrates: Paced rhythm, 60s to 70s  Relevant CV Studies: Echo 10/2015 Procedure narrative: Transthoracic echocardiography. Image  quality was poor. The study was technically difficult, as a  result of poor sound wave transmission.  - Left ventricle: The cavity size was mildly dilated. There was  mild concentric hypertrophy. Systolic function was normal. The  estimated ejection fraction was in the range of 55% to 60%. Wall  motion was normal; there were no regional wall motion  abnormalities. Left ventricular diastolic function parameters  were normal.  - Left atrium: The atrium was mildly dilated.  - Right ventricle: Systolic function was normal.  - Pulmonary arteries: Systolic pressure was within the normal  range.   Laboratory  Data:  High Sensitivity Troponin:   Recent Labs  Lab 02/03/20 1254 02/03/20 1517  TROPONINIHS 10 10     Cardiac EnzymesNo results for input(s): TROPONINI in the last 168 hours. No results for input(s): TROPIPOC in the last 168 hours.  Chemistry Recent Labs  Lab 02/03/20 1254  NA 142  K 3.9  CL 110  CO2 27  GLUCOSE 119*  BUN 16  CREATININE 0.92  CALCIUM 8.5*  GFRNONAA >60  GFRAA >60  ANIONGAP 5    No results for input(s): PROT, ALBUMIN, AST, ALT, ALKPHOS, BILITOT in the last 168 hours. Hematology Recent Labs  Lab 02/03/20 1254  WBC 6.5  RBC 3.32*  HGB 11.3*  HCT 32.3*  MCV 97.3  MCH 34.0  MCHC 35.0  RDW 14.2  PLT 129*   BNP Recent Labs  Lab 02/03/20 1254  BNP 111.1*    DDimer No results for input(s): DDIMER in the last 168 hours.   Radiology/Studies:  DG Chest 2 View  Result Date: 02/03/2020 CLINICAL DATA:  Onset chest pain, shortness of breath and weakness 2-3 hours ago. EXAM: CHEST - 2 VIEW COMPARISON:  PA and lateral chest 01/18/2018. CT chest, abdomen and pelvis 08/29/2019. FINDINGS: The patient is status post CABG with a pacing device in place. Aortic atherosclerosis. Heart size normal. Lungs clear. Nipple shadows unchanged. No pneumothorax or pleural effusion. IMPRESSION: No acute disease. Aortic Atherosclerosis (ICD10-I70.0). Electronically Signed   By: Inge Rise M.D.   On: 02/03/2020 14:00    Assessment and Plan:   Chest pain --Current CP.  At times, this chest pain becomes more severe and causes the patient to noticeably be in pain and unable to stay still in bed.   --High-sensitivity troponin 10  10.  EKG without acute ST/T changes.   --Not consistent with ACS.  No indication for emergent ischemic work-up.  No indication for IV heparin.   --Recommend further workup for non-cardiac etiology of CP. Considered is CP 2/2 PE or aortic dissection. Discussed with IM. Current plans to order cardiac CTA. Also considered is possible GI etiology.  Follow CBC to ensure no acute drops in Hgb/HCT. Not TTP on exam and denies any recent heavy lifting or moving; however, could also consider MSK etiology.      CAD s/p CABG --As above. Tn and EKG not consistent with ACS. Ordered echo to update EF and rule out acute structural abnormalities. No indication at this time for further ischemic work-up unless reduced EF or acute structural changes on echo.  No indication for IV heparin.  Continue current medications.  BP control recommended.  Risk factor control recommended.  Chronic diastolic heart failure --Euvolemic and well compensated on exam.  Denies any symptoms or signs of worsening heart failure.  Most recent echo as above.  Updating echo.  Continue current medications.  HTN --Continue current medications.  Titrate as needed for optimal BP control.  Goal BP 130/80 or lower.  BP likely elevated in the setting of pain as above.  Afib  / SSS s/p PPM --PPM functioning normally.  Continue current medications.  Daily CBC.  HLD --Continue current medications.  For questions or updates, please contact [place] Please consult [email or web address] for contact info under     Signed, Arvil Chaco, PA-C  02/03/2020 4:40 PM

## 2020-02-04 ENCOUNTER — Observation Stay (HOSPITAL_BASED_OUTPATIENT_CLINIC_OR_DEPARTMENT_OTHER)
Admit: 2020-02-04 | Discharge: 2020-02-04 | Disposition: A | Discharge status: Home / Self Care | Payer: No Typology Code available for payment source | Attending: Physician Assistant | Admitting: Physician Assistant

## 2020-02-04 DIAGNOSIS — I25118 Atherosclerotic heart disease of native coronary artery with other forms of angina pectoris: Secondary | ICD-10-CM | POA: Diagnosis not present

## 2020-02-04 DIAGNOSIS — I719 Aortic aneurysm of unspecified site, without rupture: Secondary | ICD-10-CM | POA: Diagnosis not present

## 2020-02-04 DIAGNOSIS — I5032 Chronic diastolic (congestive) heart failure: Secondary | ICD-10-CM | POA: Diagnosis not present

## 2020-02-04 DIAGNOSIS — C9110 Chronic lymphocytic leukemia of B-cell type not having achieved remission: Secondary | ICD-10-CM

## 2020-02-04 DIAGNOSIS — R079 Chest pain, unspecified: Secondary | ICD-10-CM | POA: Diagnosis not present

## 2020-02-04 DIAGNOSIS — J432 Centrilobular emphysema: Secondary | ICD-10-CM

## 2020-02-04 DIAGNOSIS — I361 Nonrheumatic tricuspid (valve) insufficiency: Secondary | ICD-10-CM | POA: Diagnosis not present

## 2020-02-04 LAB — BASIC METABOLIC PANEL
Anion gap: 10 (ref 5–15)
BUN: 13 mg/dL (ref 8–23)
CO2: 25 mmol/L (ref 22–32)
Calcium: 8.8 mg/dL — ABNORMAL LOW (ref 8.9–10.3)
Chloride: 105 mmol/L (ref 98–111)
Creatinine, Ser: 1.08 mg/dL (ref 0.61–1.24)
GFR calc Af Amer: >60 mL/min (ref >60)
GFR calc non Af Amer: >60 mL/min (ref >60)
Glucose, Bld: 122 mg/dL — ABNORMAL HIGH (ref 70–99)
Potassium: 3.4 mmol/L — ABNORMAL LOW (ref 3.5–5.1)
Sodium: 140 mmol/L (ref 135–145)

## 2020-02-04 LAB — ECHOCARDIOGRAM COMPLETE
AR max vel: 2.38 cm2
AV Area VTI: 2.8 cm2
AV Area mean vel: 2.65 cm2
AV Mean grad: 4 mmHg
AV Peak grad: 8.9 mmHg
Ao pk vel: 1.49 m/s
Area-P 1/2: 2.83 cm2
Height: 68 in
S' Lateral: 3.33 cm
Weight: 3444.8 oz

## 2020-02-04 LAB — CBC
HCT: 32.3 % — ABNORMAL LOW (ref 39.0–52.0)
Hemoglobin: 11.3 g/dL — ABNORMAL LOW (ref 13.0–17.0)
MCH: 34.3 pg — ABNORMAL HIGH (ref 26.0–34.0)
MCHC: 35 g/dL (ref 30.0–36.0)
MCV: 98.2 fL (ref 80.0–100.0)
Platelets: 130 10*3/uL — ABNORMAL LOW (ref 150–400)
RBC: 3.29 MIL/uL — ABNORMAL LOW (ref 4.22–5.81)
RDW: 14.2 % (ref 11.5–15.5)
WBC: 6.8 10*3/uL (ref 4.0–10.5)
nRBC: 0 % (ref 0.0–0.2)

## 2020-02-04 LAB — LIPID PANEL
Cholesterol: 111 mg/dL (ref 0–200)
HDL: 36 mg/dL — ABNORMAL LOW (ref >40)
LDL Cholesterol: 56 mg/dL (ref 0–99)
Total CHOL/HDL Ratio: 3.1 RATIO
Triglycerides: 95 mg/dL (ref <150)
VLDL: 19 mg/dL (ref 0–40)

## 2020-02-04 MED ORDER — METOPROLOL TARTRATE 50 MG PO TABS: 50.0000 mg | ORAL_TABLET | Freq: Two times a day (BID) | ORAL | 0 refills | Status: DC

## 2020-02-04 MED ORDER — CYCLOBENZAPRINE HCL 5 MG PO TABS: 5.0000 mg | ORAL_TABLET | Freq: Three times a day (TID) | ORAL | 0 refills | Status: AC | PRN

## 2020-02-04 MED ORDER — POTASSIUM CHLORIDE CRYS ER 20 MEQ PO TBCR
20.0000 meq | EXTENDED_RELEASE_TABLET | Freq: Once | ORAL | Status: AC
  Administered 2020-02-04: 20 meq via ORAL
  Filled 2020-02-04: qty 1

## 2020-02-04 MED ORDER — ATORVASTATIN CALCIUM 80 MG PO TABS: 80.0000 mg | ORAL_TABLET | Freq: Every day | ORAL | Status: DC

## 2020-02-04 MED ORDER — METOPROLOL TARTRATE 50 MG PO TABS
50.0000 mg | ORAL_TABLET | Freq: Two times a day (BID) | ORAL | Status: DC
  Administered 2020-02-04: 50 mg via ORAL
  Filled 2020-02-04: qty 1

## 2020-02-04 MED ORDER — POTASSIUM CHLORIDE CRYS ER 20 MEQ PO TBCR
40.0000 meq | EXTENDED_RELEASE_TABLET | Freq: Once | ORAL | Status: AC
  Administered 2020-02-04: 40 meq via ORAL
  Filled 2020-02-04: qty 2

## 2020-02-04 MED ORDER — METOPROLOL TARTRATE 25 MG PO TABS: 37.5000 mg | ORAL_TABLET | Freq: Two times a day (BID) | ORAL | Status: DC

## 2020-02-04 NOTE — Discharge Instructions (Signed)
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 Mainstays of therapy for aneurysms include good blood pressure control, healthy lifestyle, and avoiding fluoroquinolone antibiotic medications (such as those in the "Cipro" class, ending in "floxacin") due to risk of damage to the aorta. This is a finding I would expect to be monitored periodically by your primary cardiologist. Since aneurysms can run in families, you should discuss your diagnosis with first degree relatives as they may need to be screened for this. Regular mild-moderate physical exercise is OK but avoid heavy lifting/weight lifting over 30lbs, chopping wood, shoveling snow or digging heavy earth with a shovel.

## 2020-02-04 NOTE — Discharge Summary (Signed)
Physician Discharge Summary  [name] KPT:[id number] DOB: 01-Nov-1945 DOA: 02/03/2020  PCP: Leone Haven, MD  Admit date: 02/03/2020 Discharge date: 02/04/2020  Admitted From: Home Disposition: Home  Recommendations for Outpatient Follow-up:  1. Follow up with PCP in 1-2 weeks 2. Follow-up with cardiology Dr. Rockey Situ as directed  Home Health: No Equipment/Devices: None Discharge Condition: Stable CODE STATUS: Full Diet recommendation: Heart Healthy / Carb Modified  Brief/Interim Summary: HPI: [name] is a 74 y.o. male with medical history significant of hyperlipidemia, COPD, TIA, GERD, depression, PTSD, SSS, s/p of pacemaker placement, OSA on CPAP, CAD, CABG, CLL, atrial fibrillation on Eliquis, dCHF, who presents with chest pain shortness of breath.  Patient states that his chest pain started in the early morning at about 9 AM, which is located in the central chest, 7 out of 10 in severity, intermittent, tightness feeling, you are home from chest sometimes, aggravated with exertion.  He also has shortness breath, no cough, fever or chills.  Denies nausea, vomiting, diarrhea, abdominal pain, symptoms of UTI or unilateral weakness.  7/17: Patient seen and examined.  Chest pain-free at time my evaluation this morning.  Patient seen by and case discussed with cardiology.  Recommendations appreciated.  No inpatient ischemic evaluation recommended at this time.  Patient stable for discharge from their standpoint.  Recommend increase in the dose of her metoprolol.  Remainder of home medications unchanged.  Discharge Diagnoses:  Principal Problem:   Chest pain Active Problems:   Hyperlipidemia   Essential hypertension   Paroxysmal atrial fibrillation (HCC)   COPD (chronic obstructive pulmonary disease) (HCC)   CLL (chronic lymphocytic leukemia) (HCC)   Chronic diastolic CHF (congestive heart failure) (HCC)   TIA (transient ischemic attack)   OSA on CPAP   Thrombocytopenia  (HCC)   CAD (coronary artery disease)   Depression  Atypical chest pain History of coronary disease status post CABG Patient is well-known to Dr. Rockey Situ.  Presented with chest pain sharp in character, mid chest, acute onset.  Troponins negative.  Presentation inconsistent with cardiac origin.  Patient seen by cardiology.  No inpatient ischemic evaluation recommended at this time.  Suspected etiology musculoskeletal versus GI.  Patient able for discharge home at this time.  Follow-up with cardiology as directed.  Increase metoprolol to 50 mg prescribed on discharge.  As needed Flexeril also prescribed  Hyperlipidemia -Zetia and lipitor  Essential hypertension -IV hydralazine as needed -Metoprolol  Paroxysmal atrial fibrillation (HCC) -Continue Eliquis, amiodarone and metoprolol  COPD (chronic obstructive pulmonary disease) (HCC): Stable -Bronchodilators  CLL (chronic lymphocytic leukemia) ([place]): WBC 6.5, hemoglobin 11.3 -Follow-up with oncology -on Venetoclax  Chronic diastolic CHF (congestive heart failure) ([place]): 2D echo on 11/02/2015 showed EF of 55-60%.  Patient does not have leg edema or JV D.  No pulmonary edema on chest x-ray.  CHF seem to be compensated.  Patient is taking Lasix as needed at home. -Hold Lasix -Check BNP  History of TIA (transient ischemic attack) -Lipitor, Zetia -Patient is on Eliquis for atrial fibrillation  OSA - on CPAP  Chronic thrombocytopenia ([place]): Platelet 129.  No bleeding tendency -Follow-up with CBC  Depression: -Continue home medications: Remeron   Discharge Instructions  Discharge Instructions    Diet - low sodium heart healthy   Complete by: As directed    Increase activity slowly   Complete by: As directed      Allergies as of 02/04/2020   No Known Allergies     Medication List  TAKE these medications   acetaminophen 500 MG tablet Commonly known as: TYLENOL Take 1,000 mg by mouth every 8 (eight) hours as  needed for mild pain.   acyclovir 400 MG tablet Commonly known as: ZOVIRAX Take 1 tablet (400 mg total) by mouth 2 (two) times daily.   albuterol 108 (90 Base) MCG/ACT inhaler Commonly known as: VENTOLIN HFA Inhale 2 puffs into the lungs every 6 (six) hours as needed for wheezing or shortness of breath. Notes to patient: Due any time    amiodarone 200 MG tablet Commonly known as: Pacerone Take 1/2 (one-half) tablet by mouth twice daily   budesonide-formoterol 160-4.5 MCG/ACT inhaler Commonly known as: SYMBICORT Inhale 2 puffs into the lungs 2 (two) times daily.   cetirizine 10 MG tablet Commonly known as: ZYRTEC Take 10 mg by mouth daily as needed for allergies. Notes to patient: Due any time    CoQ10 200 MG Caps Take 200 mg by mouth daily.   cyclobenzaprine 5 MG tablet Commonly known as: FLEXERIL Take 1 tablet (5 mg total) by mouth 3 (three) times daily as needed for up to 7 days for muscle spasms (chest pain). Notes to patient: Last given yesterday 02/03/2020 evening    Eliquis 5 MG Tabs tablet Generic drug: apixaban Take 5 mg by mouth 2 (two) times daily.   ezetimibe 10 MG tablet Commonly known as: ZETIA Take 1 tablet (10 mg total) by mouth daily.   furosemide 20 MG tablet Commonly known as: LASIX Take 1 tablet (20 mg) by mouth twice daily as needed   Krill Oil 350 MG Caps Take 350 mg by mouth as needed.   Lipitor 40 MG tablet Generic drug: atorvastatin Take 40 mg by mouth daily. Start back slowly with 20 mg working up on slow titration to 40mg  per Dr Rockey Situ.   metoprolol tartrate 50 MG tablet Commonly known as: LOPRESSOR Take 1 tablet (50 mg total) by mouth 2 (two) times daily. What changed:   medication strength  how much to take   mirtazapine 15 MG tablet Commonly known as: REMERON Take 15 mg by mouth at bedtime as needed (for panic associated with PTSD).   multivitamin with minerals Tabs tablet Take 1 tablet by mouth daily.   nitroGLYCERIN 0.4  MG SL tablet Commonly known as: NITROSTAT Place 1 tablet (0.4 mg total) under the tongue every 5 (five) minutes as needed for chest pain.   traMADol 50 MG tablet Commonly known as: ULTRAM Take 1 tablet (50 mg total) by mouth every 12 (twelve) hours as needed.   Venclexta 100 MG Tabs Generic drug: venetoclax Take 200 mg by mouth daily. (Take 2 tablets daily) What changed:   how much to take  additional instructions       No Known Allergies  Consultations:  Cardiology-[facility]   Procedures/Studies: DG Chest 2 View  Result Date: 02/03/2020 CLINICAL DATA:  Onset chest pain, shortness of breath and weakness 2-3 hours ago. EXAM: CHEST - 2 VIEW COMPARISON:  PA and lateral chest 01/18/2018. CT chest, abdomen and pelvis 08/29/2019. FINDINGS: The patient is status post CABG with a pacing device in place. Aortic atherosclerosis. Heart size normal. Lungs clear. Nipple shadows unchanged. No pneumothorax or pleural effusion. IMPRESSION: No acute disease. Aortic Atherosclerosis (ICD10-I70.0). Electronically Signed   By: Inge Rise M.D.   On: 02/03/2020 14:00   CT Angio Chest/Abd/Pel for Dissection W and/or W/WO  Result Date: 02/03/2020 CLINICAL DATA:  Chest pain, abdominal pain EXAM: CT ANGIOGRAPHY CHEST, ABDOMEN AND PELVIS  TECHNIQUE: Non-contrast CT of the chest was initially obtained. Multidetector CT imaging through the chest, abdomen and pelvis was performed using the standard protocol during bolus administration of intravenous contrast. Multiplanar reconstructed images and MIPs were obtained and reviewed to evaluate the vascular anatomy. CONTRAST:  11mL OMNIPAQUE IOHEXOL 350 MG/ML SOLN COMPARISON:  Three/11/2018 FINDINGS: CTA CHEST FINDINGS Cardiovascular: Coronary artery bypass grafting has been performed. The thoracic aorta is normal in course and caliber. No evidence of aneurysm, intramural hematoma, or dissection. Minimal atherosclerotic calcification within the aortic arch and  descending thoracic aorta. Arch vasculature demonstrates normal anatomic configuration and is widely patent proximally. Extensive multivessel native coronary artery calcification. Global cardiac size within normal limits. Left subclavian dual lead pacemaker is seen with leads within the right atrium and right ventricle toward the apex. Aortic valve is tricuspid. No pericardial effusion. Central pulmonary arteries are of normal caliber. Mediastinum/Nodes: No pathologic thoracic adenopathy. Lungs/Pleura: The lungs are symmetrically well expanded. There are subtle subpleural interstitial infiltrates within the a dependent lower lobes bilaterally which are nonspecific. If chronic, these can be seen in the setting of smoking related lung disease (desquamative interstitial pneumonia), collagen vascular disease, or drug toxicity. These are extremely mild in severity. No confluent pulmonary infiltrates. No pneumothorax or pleural effusion. The central airways are widely patent. Musculoskeletal: Advanced degenerative changes are seen within the thoracic spine. No acute bone abnormality. Review of the MIP images confirms the above findings. 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 Moderate scattered atherosclerotic calcification. No dissection. 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 SMA: Unremarkable Renals: Dual right renal arteries. Single left renal artery. Widely patent. IMA: Patent. Inflow: Mild calcified atherosclerotic plaque. No aneurysm. No hemodynamically significant stenosis. Internal iliac arteries are patent bilaterally. Veins: Poorly opacified, but morphologically unremarkable. Review of the MIP images confirms the above findings. NON-VASCULAR Hepatobiliary: Status post cholecystectomy.  Liver unremarkable. No intra or extrahepatic biliary ductal dilation. Pancreas: Unremarkable Spleen: Unremarkable Adrenals/Urinary Tract: Adrenal glands are unremarkable. Right parapelvic cyst noted. The kidneys are otherwise unremarkable. The bladder is moderately distended, but is otherwise unremarkable. Stomach/Bowel: Mild sigmoid diverticulosis. Stomach, small bowel, and large bowel are otherwise unremarkable. Appendix not visualized and likely absent. No free intraperitoneal gas or fluid. Lymphatic: There is shotty left periaortic lymphadenopathy present which has improved since prior examination and is compatible with the residua of treated disease. No frankly pathologic adenopathy within the abdomen and pelvis. Reproductive: Obscured by streak artifact from bilateral hip prostheses. Other: Bilateral inguinal hernia repair with mesh has been performed. Musculoskeletal: Bilateral total hip arthroplasty has been performed. Degenerative changes are seen within the lumbar spine. No acute bone abnormality. Review of the MIP images confirms the above findings. IMPRESSION: No evidence of thoracoabdominal aortic dissection or thoracic aortic aneurysm. 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 Ectatic abdominal aorta at risk for aneurysm development. Recommend followup by ultrasound in 5 years. This recommendation follows ACR consensus guidelines: White Paper of the ACR Incidental Findings Committee II on Vascular Findings. J Am Coll Radiol 2013; 10:789-794. Aortic aneurysm NOS (ICD10-I71.9) Marked interval improvement in retroperitoneal adenopathy in keeping with treated disease. No residual pathologic adenopathy identified. Aortic Atherosclerosis (ICD10-I70.0). Electronically Signed   By: Fidela Salisbury MD   On: 02/03/2020 18:13    (Echo, Carotid, EGD,  Colonoscopy, ERCP)    Subjective: Patient seen and examined.  No complaints.  Chest  pain-free.  Stable for discharge  Discharge Exam: Vitals:   02/04/20 0812 02/04/20 1149  BP: (!) 152/78 132/71  Pulse: 63 61  Resp: 17 17  Temp: 97.8 F (36.6 C) 98 F (36.7 C)  SpO2: 97% 95%   Vitals:   02/03/20 2033 02/04/20 0354 02/04/20 0812 02/04/20 1149  BP: (!) 148/94 (!) 154/79 (!) 152/78 132/71  Pulse: 62 67 63 61  Resp:   17 17  Temp: 97.7 F (36.5 C) 98.1 F (36.7 C) 97.8 F (36.6 C) 98 F (36.7 C)  TempSrc: Oral  Oral Oral  SpO2: 100% 99% 97% 95%  Weight:  97.7 kg    Height:        General: Pt is alert, awake, not in acute distress Cardiovascular: RRR, S1/S2 +, no rubs, no gallops Respiratory: CTA bilaterally, no wheezing, no rhonchi Abdominal: Soft, NT, ND, bowel sounds + Extremities: no edema, no cyanosis    The results of significant diagnostics from this hospitalization (including imaging, microbiology, ancillary and laboratory) are listed below for reference.     Microbiology: Recent Results (from the past 240 hour(s))  SARS Coronavirus 2 by RT PCR (hospital order, performed in [facility] hospital lab) Nasopharyngeal Nasopharyngeal Swab     Status: None   Collection Time: 02/03/20  4:29 PM   Specimen: Nasopharyngeal Swab  Result Value Ref Range Status   SARS Coronavirus 2 NEGATIVE NEGATIVE Final    Comment: (NOTE) SARS-CoV-2 target nucleic acids are NOT DETECTED.  The SARS-CoV-2 RNA is generally detectable in upper and lower respiratory specimens during the acute phase of infection. The lowest concentration of SARS-CoV-2 viral copies this assay can detect is 250 copies / mL. A negative result does not preclude SARS-CoV-2 infection and should not be used as the sole basis for treatment or other patient management decisions.  A negative result may occur with improper specimen collection / handling, submission of specimen other than nasopharyngeal swab, presence of viral mutation(s) within the areas targeted by this assay, and inadequate number  of viral copies (<250 copies / mL). A negative result must be combined with clinical observations, patient history, and epidemiological information.  Fact Sheet for Patients:   StrictlyIdeas.no  Fact Sheet for Healthcare Providers: BankingDealers.co.za  This test is not yet approved or  cleared by the [place] FDA and has been authorized for detection and/or diagnosis of SARS-CoV-2 by FDA under an Emergency Use Authorization (EUA).  This EUA will remain in effect (meaning this test can be used) for the duration of the COVID-19 declaration under Section 564(b)(1) of the Act, 21 U.S.C. section 360bbb-3(b)(1), unless the authorization is terminated or revoked sooner.  Performed at [facility], [address] [place], [place] [address]      Labs: BNP (last 3 results) Recent Labs    02/03/20 1254  BNP 315.4*   Basic Metabolic Panel: Recent Labs  Lab 02/03/20 1254 02/04/20 0451  NA 142 140  K 3.9 3.4*  CL 110 105  CO2 27 25  GLUCOSE 119* 122*  BUN 16 13  CREATININE 0.92 1.08  CALCIUM 8.5* 8.8*   Liver Function Tests: No results for input(s): AST, ALT, ALKPHOS, BILITOT, PROT, ALBUMIN in the last 168 hours. No results for input(s): LIPASE, AMYLASE in the last 168 hours. No results for input(s): AMMONIA in the last 168 hours. CBC: Recent Labs  Lab 02/03/20 1254 02/04/20 0451  WBC 6.5 6.8  HGB 11.3* 11.3*  HCT 32.3* 32.3*  MCV 97.3 98.2  PLT 129* 130*   Cardiac Enzymes: No results for input(s): CKTOTAL, CKMB, CKMBINDEX, TROPONINI in the last 168 hours. BNP: Invalid input(s): POCBNP CBG: No results for input(s): GLUCAP in the last 168 hours. D-Dimer No results for input(s): DDIMER in the last 72 hours. Hgb A1c No results for input(s): HGBA1C in the last 72 hours. Lipid Profile Recent Labs    02/04/20 0451  CHOL 111  HDL 36*  LDLCALC 56  TRIG 95  CHOLHDL 3.1   Thyroid function studies No  results for input(s): TSH, T4TOTAL, T3FREE, THYROIDAB in the last 72 hours.  Invalid input(s): FREET3 Anemia work up No results for input(s): VITAMINB12, FOLATE, FERRITIN, TIBC, IRON, RETICCTPCT in the last 72 hours. Urinalysis    Component Value Date/Time   COLORURINE YELLOW (A) 09/16/2018 1252   APPEARANCEUR CLEAR (A) 09/16/2018 1252   APPEARANCEUR Clear 10/11/2014 2003   LABSPEC 1.013 09/16/2018 1252   LABSPEC 1.017 10/11/2014 2003   PHURINE 6.0 09/16/2018 1252   GLUCOSEU NEGATIVE 09/16/2018 1252   GLUCOSEU Negative 10/11/2014 2003   HGBUR MODERATE (A) 09/16/2018 1252   BILIRUBINUR NEGATIVE 09/16/2018 1252   BILIRUBINUR Negative 10/11/2014 2003   KETONESUR NEGATIVE 09/16/2018 1252   PROTEINUR NEGATIVE 09/16/2018 1252   UROBILINOGEN 1.0 05/12/2009 1426   NITRITE NEGATIVE 09/16/2018 1252   LEUKOCYTESUR NEGATIVE 09/16/2018 1252   LEUKOCYTESUR Negative 10/11/2014 2003   Sepsis Labs Invalid input(s): PROCALCITONIN,  WBC,  LACTICIDVEN Microbiology Recent Results (from the past 240 hour(s))  SARS Coronavirus 2 by RT PCR (hospital order, performed in [place] hospital lab) Nasopharyngeal Nasopharyngeal Swab     Status: None   Collection Time: 02/03/20  4:29 PM   Specimen: Nasopharyngeal Swab  Result Value Ref Range Status   SARS Coronavirus 2 NEGATIVE NEGATIVE Final    Comment: (NOTE) SARS-CoV-2 target nucleic acids are NOT DETECTED.  The SARS-CoV-2 RNA is generally detectable in upper and lower respiratory specimens during the acute phase of infection. The lowest concentration of SARS-CoV-2 viral copies this assay can detect is 250 copies / mL. A negative result does not preclude SARS-CoV-2 infection and should not be used as the sole basis for treatment or other patient management decisions.  A negative result may occur with improper specimen collection / handling, submission of specimen other than nasopharyngeal swab, presence of viral mutation(s) within the areas  targeted by this assay, and inadequate number of viral copies (<250 copies / mL). A negative result must be combined with clinical observations, patient history, and epidemiological information.  Fact Sheet for Patients:   StrictlyIdeas.no  Fact Sheet for Healthcare Providers: BankingDealers.co.za  This test is not yet approved or  cleared by the [place] FDA and has been authorized for detection and/or diagnosis of SARS-CoV-2 by FDA under an Emergency Use Authorization (EUA).  This EUA will remain in effect (meaning this test can be used) for the duration of the COVID-19 declaration under Section 564(b)(1) of the Act, 21 U.S.C. section 360bbb-3(b)(1), unless the authorization is terminated or revoked sooner.  Performed at [facility], [address] [place], [place] [address]      Time coordinating discharge: Over 30 minutes  SIGNED:   Sidney Ace, MD  [facility] 02/04/2020, 1:21 PM Pager   If 7PM-7AM, please contact night-coverage

## 2020-02-04 NOTE — Progress Notes (Signed)
Discussed discharge instructions with patient and wife, including medications and follow up appointments.   No further questions, IV removed and instruction sent home with patient.

## 2020-02-04 NOTE — Progress Notes (Signed)
Patient has home cpap unit. Free of any noticeable damage. Patient is able self apply and setup when needed.

## 2020-02-04 NOTE — Progress Notes (Addendum)
Progress Note  Patient Name: [name] Date of Encounter: 02/04/2020  Primary Cardiologist:Dr. Rockey Situ  Subjective   Atypical and central CP, earlier this AM, feeling as if it was a knife, and lasting only minutes.  Reports this chest pain is less intense in severity than yesterday.  Wore his CPAP last night.  Breathing well.   Reviewed his CTA results in detail, as well as recommendations to prevent further aortic dilation.  Reviewed need for BP cholesterol control.  Clarified that he does not have a statin allergy.  Inpatient Medications    Scheduled Meds: . acyclovir  400 mg Oral BID  . amiodarone  100 mg Oral BID  . apixaban  5 mg Oral BID  . atorvastatin  40 mg Oral Daily  . ezetimibe  10 mg Oral Daily  . loratadine  10 mg Oral Daily  . meloxicam  7.5 mg Oral Daily  . metoprolol tartrate  37.5 mg Oral BID  . mometasone-formoterol  2 puff Inhalation BID  . multivitamin with minerals  1 tablet Oral Daily  . venetoclax  100 mg Oral Daily   Continuous Infusions:  PRN Meds: acetaminophen, albuterol, cyclobenzaprine, mirtazapine, morphine injection, nitroGLYCERIN, ondansetron (ZOFRAN) IV, traMADol   Vital Signs    Vitals:   02/03/20 1754 02/03/20 2033 02/04/20 0354 02/04/20 0812  BP: (!) 176/84 (!) 148/94 (!) 154/79 (!) 152/78  Pulse: 60 62 67 63  Resp: 16   17  Temp: 97.7 F (36.5 C) 97.7 F (36.5 C) 98.1 F (36.7 C) 97.8 F (36.6 C)  TempSrc:  Oral  Oral  SpO2: 100% 100% 99% 97%  Weight:   97.7 kg   Height:        Intake/Output Summary (Last 24 hours) at 02/04/2020 0924 Last data filed at 02/04/2020 0404 Gross per 24 hour  Intake 500 ml  Output 1150 ml  Net -650 ml   Last 3 Weights 02/04/2020 02/03/2020 01/19/2020  Weight (lbs) 215 lb 4.8 oz 217 lb 218 lb  Weight (kg) 97.659 kg 98.431 kg 98.884 kg      Telemetry    Paced rhythm, 60-70- Personally Reviewed  ECG    No new tracings - Personally Reviewed  Physical Exam   GEN: No acute  distress.   Neck: JVD difficult to assess 2/2 body habitus Cardiac: RRR, no murmurs, rubs, or gallops.  Respiratory: Coarse breath sounds bilaterally. GI: Soft, nontender, non-distended  MS: No edema; No deformity. Neuro:  Nonfocal  Psych: Normal affect   Labs    High Sensitivity Troponin:   Recent Labs  Lab 02/03/20 1254 02/03/20 1517 02/03/20 1757 02/03/20 1936 02/03/20 2118  TROPONINIHS 10 10 11 12 12       Chemistry Recent Labs  Lab 02/03/20 1254 02/04/20 0451  NA 142 140  K 3.9 3.4*  CL 110 105  CO2 27 25  GLUCOSE 119* 122*  BUN 16 13  CREATININE 0.92 1.08  CALCIUM 8.5* 8.8*  GFRNONAA >60 >60  GFRAA >60 >60  ANIONGAP 5 10     Hematology Recent Labs  Lab 02/03/20 1254 02/04/20 0451  WBC 6.5 6.8  RBC 3.32* 3.29*  HGB 11.3* 11.3*  HCT 32.3* 32.3*  MCV 97.3 98.2  MCH 34.0 34.3*  MCHC 35.0 35.0  RDW 14.2 14.2  PLT 129* 130*    BNP Recent Labs  Lab 02/03/20 1254  BNP 111.1*     DDimer No results for input(s): DDIMER in the last 168 hours.   Radiology  DG Chest 2 View  Result Date: 02/03/2020 CLINICAL DATA:  Onset chest pain, shortness of breath and weakness 2-3 hours ago. EXAM: CHEST - 2 VIEW COMPARISON:  PA and lateral chest 01/18/2018. CT chest, abdomen and pelvis 08/29/2019. FINDINGS: The patient is status post CABG with a pacing device in place. Aortic atherosclerosis. Heart size normal. Lungs clear. Nipple shadows unchanged. No pneumothorax or pleural effusion. IMPRESSION: No acute disease. Aortic Atherosclerosis (ICD10-I70.0). Electronically Signed   By: Inge Rise M.D.   On: 02/03/2020 14:00   CT Angio Chest/Abd/Pel for Dissection W and/or W/WO  Result Date: 02/03/2020 CLINICAL DATA:  Chest pain, abdominal pain EXAM: CT ANGIOGRAPHY CHEST, ABDOMEN AND PELVIS TECHNIQUE: Non-contrast CT of the chest was initially obtained. Multidetector CT imaging through the chest, abdomen and pelvis was performed using the standard protocol during  bolus administration of intravenous contrast. Multiplanar reconstructed images and MIPs were obtained and reviewed to evaluate the vascular anatomy. CONTRAST:  115mL OMNIPAQUE IOHEXOL 350 MG/ML SOLN COMPARISON:  Three/11/2018 FINDINGS: CTA CHEST FINDINGS Cardiovascular: Coronary artery bypass grafting has been performed. The thoracic aorta is normal in course and caliber. No evidence of aneurysm, intramural hematoma, or dissection. Minimal atherosclerotic calcification within the aortic arch and descending thoracic aorta. Arch vasculature demonstrates normal anatomic configuration and is widely patent proximally. Extensive multivessel native coronary artery calcification. Global cardiac size within normal limits. Left subclavian dual lead pacemaker is seen with leads within the right atrium and right ventricle toward the apex. Aortic valve is tricuspid. No pericardial effusion. Central pulmonary arteries are of normal caliber. Mediastinum/Nodes: No pathologic thoracic adenopathy. Lungs/Pleura: The lungs are symmetrically well expanded. There are subtle subpleural interstitial infiltrates within the a dependent lower lobes bilaterally which are nonspecific. If chronic, these can be seen in the setting of smoking related lung disease (desquamative interstitial pneumonia), collagen vascular disease, or drug toxicity. These are extremely mild in severity. No confluent pulmonary infiltrates. No pneumothorax or pleural effusion. The central airways are widely patent. Musculoskeletal: Advanced degenerative changes are seen within the thoracic spine. No acute bone abnormality. Review of the MIP images confirms the above findings. 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 Moderate scattered atherosclerotic calcification. No  dissection. 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 SMA: Unremarkable Renals: Dual right renal arteries. Single left renal artery. Widely patent. IMA: Patent. Inflow: Mild calcified atherosclerotic plaque. No aneurysm. No hemodynamically significant stenosis. Internal iliac arteries are patent bilaterally. Veins: Poorly opacified, but morphologically unremarkable. Review of the MIP images confirms the above findings. NON-VASCULAR Hepatobiliary: Status post cholecystectomy. Liver unremarkable. No intra or extrahepatic biliary ductal dilation. Pancreas: Unremarkable Spleen: Unremarkable Adrenals/Urinary Tract: Adrenal glands are unremarkable. Right parapelvic cyst noted. The kidneys are otherwise unremarkable. The bladder is moderately distended, but is otherwise unremarkable. Stomach/Bowel: Mild sigmoid diverticulosis. Stomach, small bowel, and large bowel are otherwise unremarkable. Appendix not visualized and likely absent. No free intraperitoneal gas or fluid. Lymphatic: There is shotty left periaortic lymphadenopathy present which has improved since prior examination and is compatible with the residua of treated disease. No frankly pathologic adenopathy within the abdomen and pelvis. Reproductive: Obscured by streak artifact from bilateral hip prostheses. Other: Bilateral inguinal hernia repair with mesh has been performed. Musculoskeletal: Bilateral total hip arthroplasty has been performed. Degenerative changes are seen within the lumbar spine. No acute  bone abnormality. Review of the MIP images confirms the above findings. IMPRESSION: No evidence of thoracoabdominal aortic dissection or thoracic aortic aneurysm. 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 Ectatic abdominal aorta at risk for aneurysm development. Recommend followup by ultrasound in 5 years. This recommendation follows ACR consensus guidelines:  White Paper of the ACR Incidental Findings Committee II on Vascular Findings. J Am Coll Radiol 2013; 10:789-794. Aortic aneurysm NOS (ICD10-I71.9) Marked interval improvement in retroperitoneal adenopathy in keeping with treated disease. No residual pathologic adenopathy identified. Aortic Atherosclerosis (ICD10-I70.0). Electronically Signed   By: Fidela Salisbury MD   On: 02/03/2020 18:13    Cardiac Studies   CTA 7/17/ IMPRESSION: --No evidence of thoracoabdominal aortic dissection or thoracic aortic aneurysm. 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 Ectatic abdominal aorta at risk for aneurysm development. Recommend followup by ultrasound in 5 years. This recommendation follows ACR consensus guidelines: White Paper of the ACR Incidental Findings Committee II on Vascular Findings. J Am Coll Radiol 2013; 10:789-794. --Aortic aneurysm NOS (ICD10-I71.9) --Marked interval improvement in retroperitoneal adenopathy in keeping with treated disease. No residual pathologic adenopathy identified. --Aortic Atherosclerosis (ICD10-I70.0).  Patient Profile     74 y.o. 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  Assessment & Plan    Chest pain, suspected MSK in etiology --Earlier mild CP this AM. CP atypical in description.  Revealed yesterday that he was lifting heavy objects earlier in the week. Etiology of CP suspected to be MSK; presentation more consistent with non-cardiac etiology. High-sensitivity troponin 10  10.  EKG without acute ST/T changes. Not consistent with ACS. No indication for emergent ischemic work-up.  No indication for IV heparin.   --CTA showed atherosclerotic ulcer and dilation as below. No evidence of PE or dissection. --Echo  reviewed this morning by rounding cardiologist and showed normal EF and no acute structural changes or valvular abnormalities. --Continue current medications.  Increased BB to metoprolol tartrate 50mg  twice daily for more optimal BP control. Given PPM, no concern for bradycardia.  --Will increase to high intensity statin given uncontrolled LDL. --Pending rounding MD to see patient, we can likely sign off and schedule office follow-up.    Penetrating atherosclerotic ulcer, infrarenal abdominal aorta Mild dilation of the infrarenal abdominal aorta Abdominal aorta, risk for aneurysm development --CT findings as copied and pasted above.   --Repeat imaging recommended in 5 years. --Avoid FQ, heavy lifting. --Cholesterol and BP control recommended. --Will increase to metoprolol 50mg  BID for additional BP support, as he has tolerated this dose in the past and current BP sub-optimal.  --LDL 09/2019 was 172. Increased to Lipitor 80mg  daily. Recheck 6-8 weeks.   Hypokalemia --Repleting with goal 4.0. K+ was 3.4 this AM.  CAD s/p CABG --As above. Tn and EKG not consistent with ACS.  --Echo with nl EF and NRWMA.  --No indication this admission for further ischemic work-up. --Continue current medications. Increased BB to metoprolol tartrate 50 mg twice daily.  Increased to atorvastatin 80 mg daily.  Recheck lipid and liver function in 6 to 8 weeks.  Chronic diastolic heart failure --Euvolemic and well compensated on exam.  Updated echo with normal EF and without acute structural abnormalities or valvular dz.  Continue current medications as above.  HTN -- Goal  BP 130/80 or lower.  BB increased as above to metoprolol tartrate 50 mg twice daily.  Afib  / SSS s/p PPM --PPM functioning normally.  Continue current medications as above.    HLD --Recommend high intensity atorvastatin 80 mg daily.  Repeat lipid and liver function in 6 to 8 weeks.  Goal LDL below 70.   For questions or updates,  please contact [place] Please consult [email or web address] for contact info under     Signed, Arvil Chaco, PA-C  02/04/2020, 9:24 AM      Patient seen, examined. Available data reviewed. Agree with findings, assessment, and plan as outlined by Marrianne Mood, PA-C.  The patient is independently interviewed and examined.  His daughter is at the bedside.  On my examination, he is alert, oriented, in no distress.  Lungs are clear, heart is regular rate and rhythm no murmur gallop, carotid upstrokes are normal, JVP is normal, abdomen is soft and nontender, extremities have no edema.  Clinical notes, lab data, and radiographic data is all reviewed.  The patient has atypical chest pain and negative serial troponins.  He is well-appearing and I think he is stable for hospital discharge.  I agree with the findings and plan as outlined above.  He does have a penetrating atherosclerotic ulcer in the infrarenal abdominal aorta with follow-up recommendations as outlined.  He does not have any abdominal pain consistent with an acute aortic syndrome.  Agree with continued medical therapy for ongoing secondary risk reduction.  Outpatient cardiology follow-up will be arranged.  [facility] will sign off.   Medication Recommendations:  Continue same Rx Other recommendations (labs, testing, etc):  none Follow up as an outpatient:  Will arrange with Dr Geoffry Paradise, M.D. 02/04/2020 10:58 AM

## 2020-02-06 ENCOUNTER — Telehealth: Payer: Self-pay | Admitting: Cardiovascular Disease

## 2020-02-06 NOTE — Telephone Encounter (Signed)
Spoke with patient who states the prescription was sent to the [facility] and is being mailed to him. He states he is only to take this medication for one week then discontinue.

## 2020-02-06 NOTE — Telephone Encounter (Signed)
°*  STAT* If patient is at the pharmacy, call can be transferred to refill team.   1. Which medications need to be refilled? (please list name of each medication and dose if known)    Flexeril 5 mg po TID for 7 days   2. Which pharmacy/location (including street and city if local pharmacy) is medication to be sent to?  [facility] [place]   3. Do they need a 30 day or 90 day supply? Unknown    Patient advised to call pcp office for request but states this is for chest pain

## 2020-02-06 NOTE — Telephone Encounter (Signed)
Patient calling  Patient was seen in ED - upon leaving they did not mention that he would need a follow up appointment  Patient would like to know if it would be neccessary  Please call to discuss

## 2020-02-07 NOTE — Telephone Encounter (Signed)
Spoke with patient and he needs follow up from ED. Requested Dr. Rockey Situ but we do not have any appointments available. Inquired if he would like to see one of our APP providers. He was agreeable to see one given that there is nothing available with his provider. He was appreciative for the call back with no further questions at this time. Advised to call back if any needs before upcoming appointment. Instructed him to also arrive early due to entrance at [facility].

## 2020-02-14 MED FILL — VENCLEXTA 100 MG TABS: 100 | 30 days supply | Qty: 30 | Fill #3

## 2020-02-20 ENCOUNTER — Ambulatory Visit
Admission: RE | Admit: 2020-02-20 | Discharge: 2020-02-20 | Disposition: A | Discharge status: Home / Self Care | Payer: Medicare HMO | Source: Ambulatory Visit | Attending: Internal Medicine | Admitting: Internal Medicine

## 2020-02-20 ENCOUNTER — Other Ambulatory Visit: Payer: Self-pay

## 2020-02-20 ENCOUNTER — Other Ambulatory Visit: Payer: Medicare HMO

## 2020-02-20 ENCOUNTER — Encounter: Payer: Self-pay | Admitting: Primary Care

## 2020-02-20 ENCOUNTER — Ambulatory Visit (INDEPENDENT_AMBULATORY_CARE_PROVIDER_SITE_OTHER): Payer: Medicare HMO | Admitting: Primary Care

## 2020-02-20 VITALS — BP 140/78 | HR 63 | Temp 98.2°F | Ht 68.0 in | Wt 219.0 lb

## 2020-02-20 DIAGNOSIS — C9110 Chronic lymphocytic leukemia of B-cell type not having achieved remission: Secondary | ICD-10-CM | POA: Insufficient documentation

## 2020-02-20 DIAGNOSIS — J449 Chronic obstructive pulmonary disease, unspecified: Secondary | ICD-10-CM

## 2020-02-20 DIAGNOSIS — Z9989 Dependence on other enabling machines and devices: Secondary | ICD-10-CM

## 2020-02-20 DIAGNOSIS — G4733 Obstructive sleep apnea (adult) (pediatric): Secondary | ICD-10-CM | POA: Diagnosis not present

## 2020-02-20 IMAGING — CT CT CHEST W/ CM
2 of 5 series · 11 of 36 positions shown, 13 images · IV contrast (omnipaque)
Comparison: Prior study [DATE] and [DATE].

CLINICAL DATA: History of chronic lymphocytic leukemia for routine
follow-up. No current complaints.

EXAM:
CT CHEST, ABDOMEN, AND PELVIS WITH CONTRAST
TECHNIQUE: Multidetector CT imaging of the chest, abdomen and pelvis was
performed following the standard protocol during bolus
administration of intravenous contrast.
CONTRAST:  100mL OMNIPAQUE IOHEXOL 300 MG/ML  SOLN

[Series 4: cap with (person_name) 5.00 ax · axial · 0.77mm/px · z∈[-1298,-723]mm · 8 of 145 slices shown, 10 images]
[im 15/145  mediastinal]
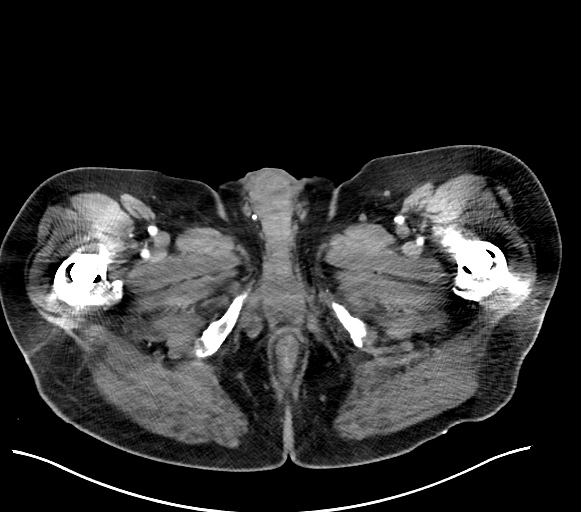
[im 15/145  lung]
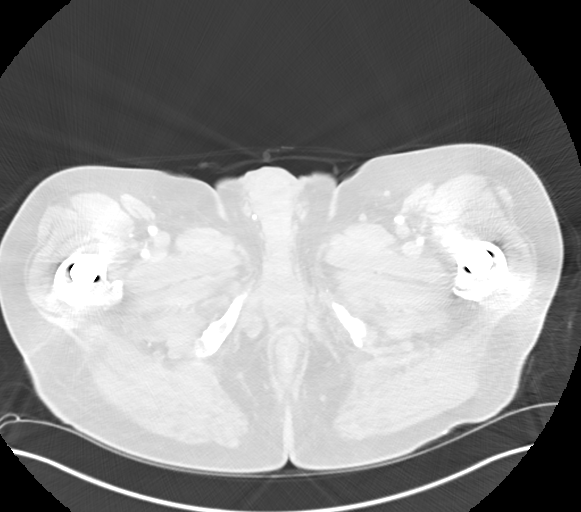
[im 29/145  lung]
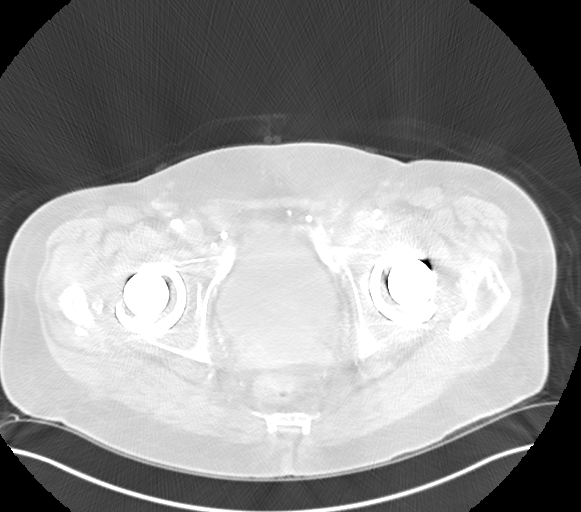
[im 44/145  lung]
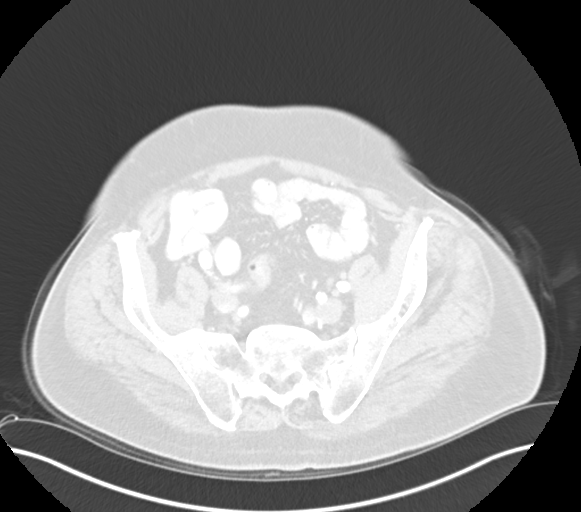
[im 58/145  lung]
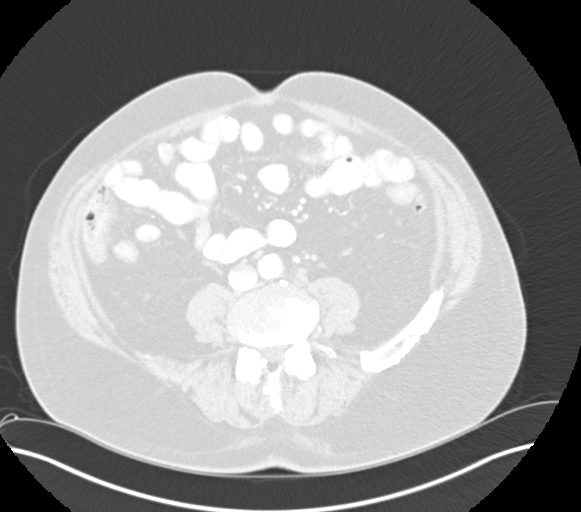
[im 87/145  mediastinal]
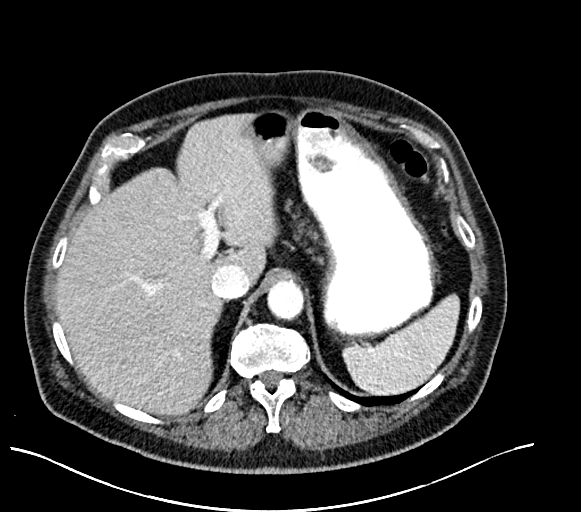
[im 87/145  lung]
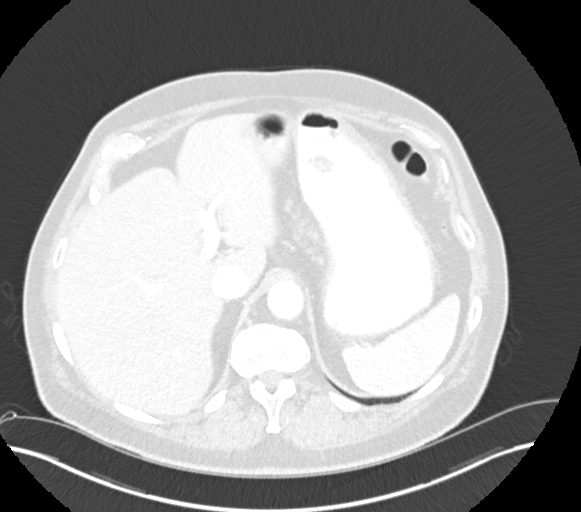
[im 101/145  lung]
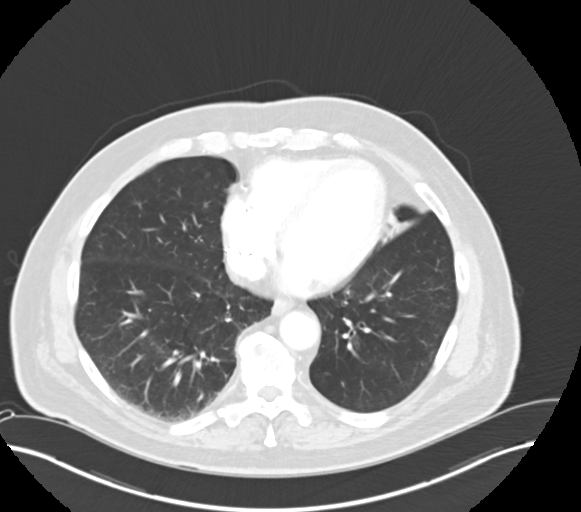
[im 116/145  lung]
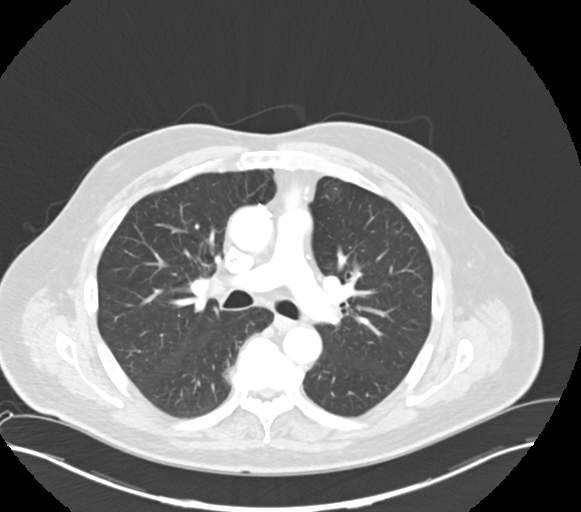
[im 130/145  lung]
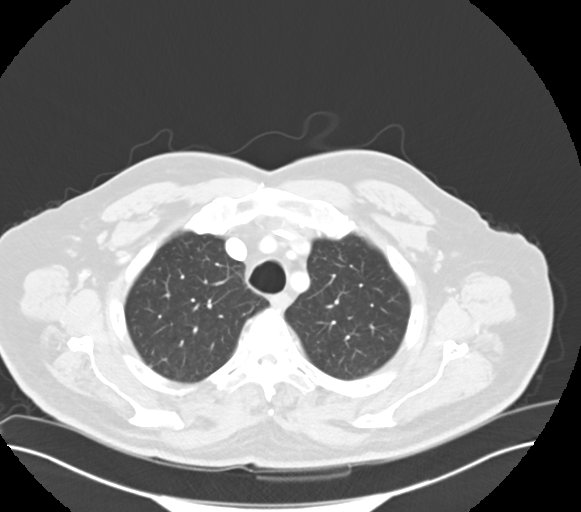

[Series 8: coronal cap with (person_name) 2.00 cor · coronal · 0.88mm/px · 3 of 156 slices shown]
[im 32/156  lung]
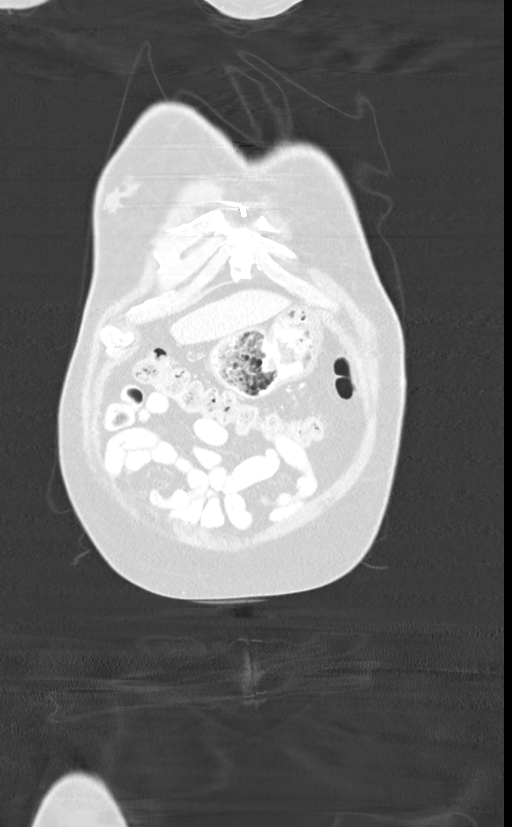
[im 63/156  lung]
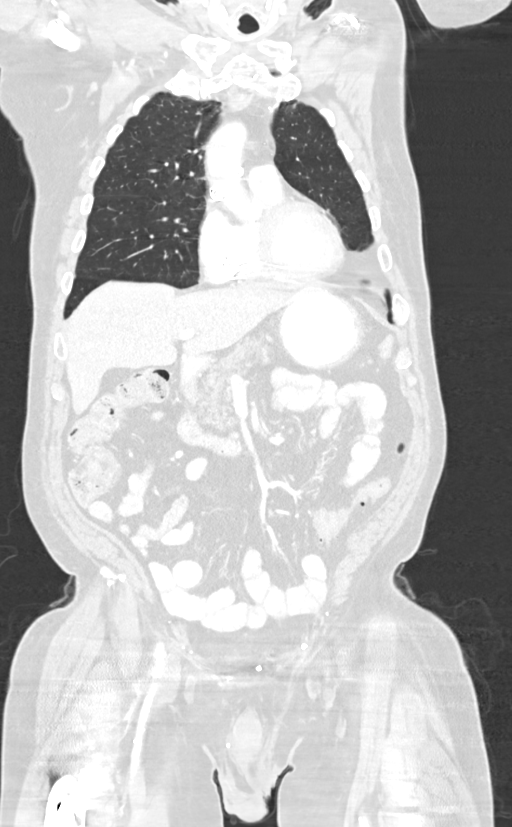
[im 94/156  lung]
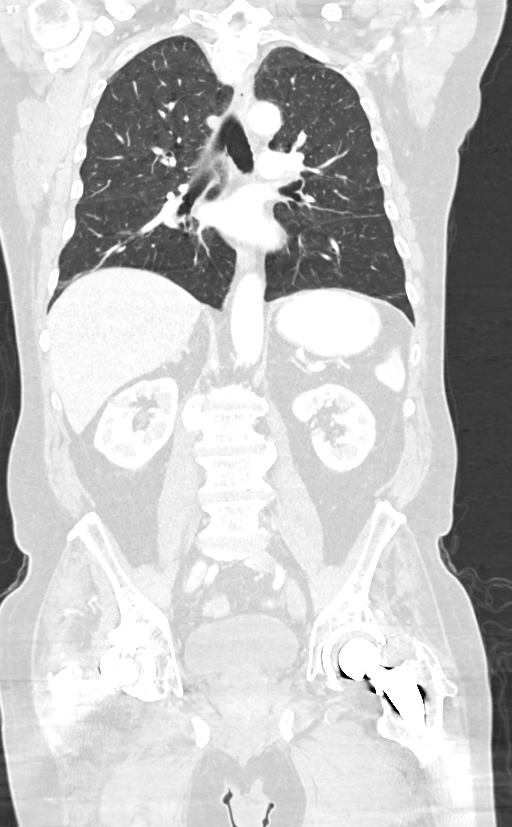

[11 of 36 positions shown; findings below may reference images not displayed]

FINDINGS: CT CHEST FINDINGS

Cardiovascular: There is atherosclerosis of the aorta, great vessels
and coronary arteries status post median sternotomy and CABG. Left
subclavian pacemaker leads extend into the right atrium and right
ventricle. Probable aortic valvular calcifications. The heart size
is normal. There is no pericardial effusion.

Mediastinum/Nodes: There are no enlarged mediastinal, hilar or
axillary lymph nodes. The thyroid gland, trachea and esophagus
demonstrate no significant findings.

Lungs/Pleura: No pleural effusion or pneumothorax. Mild
centrilobular and paraseptal emphysema with diffuse central airway
thickening. There is chronic atelectasis at the lung bases and the
lingula. No suspicious pulmonary nodule.

Musculoskeletal/Chest wall: No chest wall mass or suspicious osseous
findings. Stable appearance of the median sternotomy.

CT ABDOMEN AND PELVIS FINDINGS

Hepatobiliary: The liver is normal in density without suspicious
focal abnormality. No significant biliary dilatation status post
cholecystectomy.

Pancreas: Unremarkable. No pancreatic ductal dilatation or
surrounding inflammatory changes.

Spleen: Normal in size without focal abnormality.

Adrenals/Urinary Tract: Both adrenal glands appear normal. Stable
parapelvic cyst in the upper pole of the right kidney. No evidence
of renal mass, urinary tract calculus or hydronephrosis. The bladder
appears unremarkable.

Stomach/Bowel: No evidence of bowel wall thickening, distention or
surrounding inflammatory change. Mild sigmoid diverticulosis.

Vascular/Lymphatic: There are no enlarged abdominal or pelvic lymph
nodes. Scattered small residual retroperitoneal lymph nodes are
stable. There is diffuse aortic and branch vessel atherosclerosis
with a stable penetrating ulcer of the infrarenal abdominal aorta.
This results in an aneurysm with a greatest AP dimension of 3.2 cm,
unchanged from the previous study (remeasured). No acute vascular
findings are seen. The portal, superior mesenteric and splenic veins
are patent.

Reproductive: The prostate gland is partly obscured by artifact from
the total hip arthroplasties but appears grossly stable.

Other: Stable postsurgical changes consistent with bilateral
inguinal herniorrhaphy. No recurrent hernia or ascites.

Musculoskeletal: No acute or significant osseous findings. Previous
bilateral total hip arthroplasty. Multilevel lumbar spondylosis.
IMPRESSION: 1. No evidence of recurrent adenopathy or splenomegaly. Small
retroperitoneal lymph nodes are unchanged.
2. No acute findings in the chest, abdomen or pelvis.
3. Stable penetrating ulcer and infrarenal abdominal aortic aneurysm
with a greatest AP dimension of 3.2 cm. Recommend follow-up every 3
years. This recommendation follows ACR consensus guidelines: White
Paper of the ACR Incidental Findings Committee II on Vascular
Findings. [HOSPITAL] [5H]; [DATE].
4. Aortic Atherosclerosis ([5H]-[5H]) and Emphysema ([5H]-[5H]).

## 2020-02-20 IMAGING — CT CT NECK W/ CM
3 of 5 series · 11 of 33 positions shown, 13 images · IV contrast (omnipaque)
Comparison: [DATE]

CLINICAL DATA: Lymphoma, follow-up

EXAM:
CT NECK WITH CONTRAST
TECHNIQUE: Multidetector CT imaging of the neck was performed using the
standard protocol following the bolus administration of intravenous
contrast.
CONTRAST:  100mL OMNIPAQUE IOHEXOL 300 MG/ML  SOLN

[Series 5: coronals neck 2.00 cor · coronal · 0.43mm/px · 3 of 143 slices shown]
[im 57/143  bone]
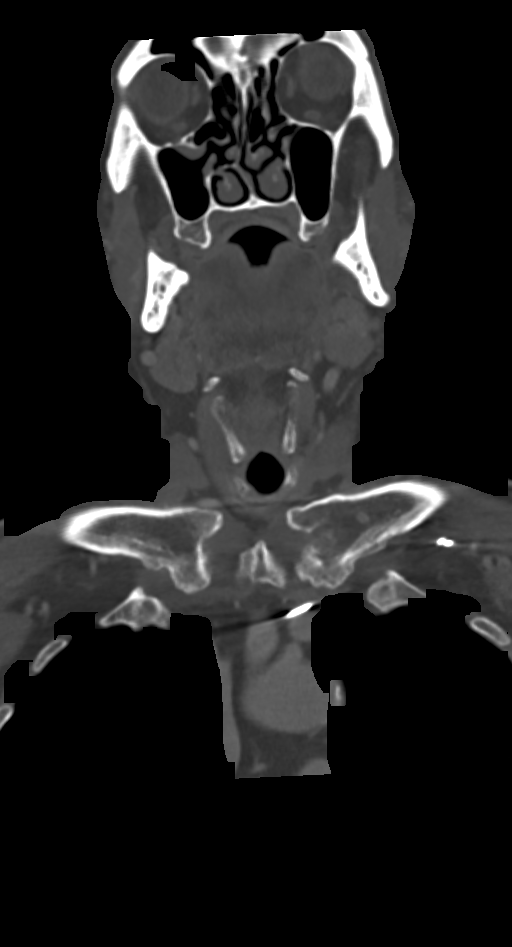
[im 67/143  bone]
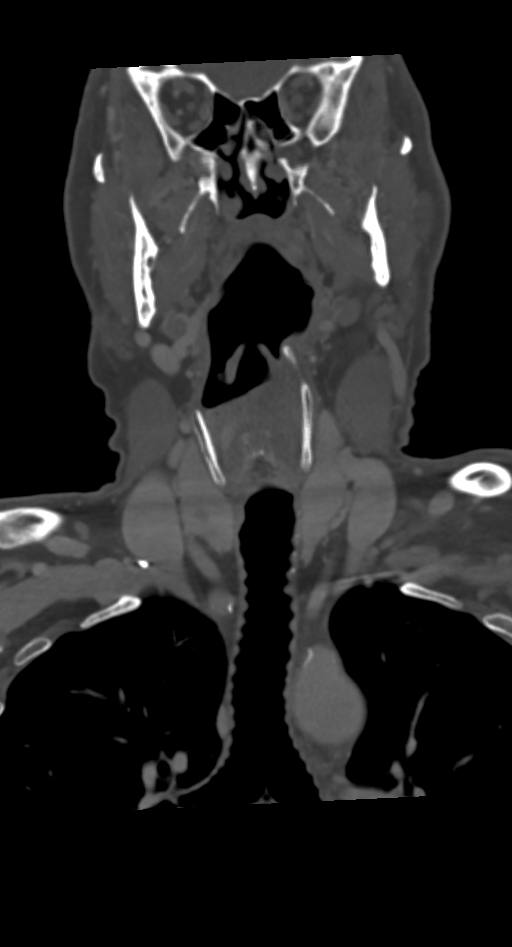
[im 77/143  bone]
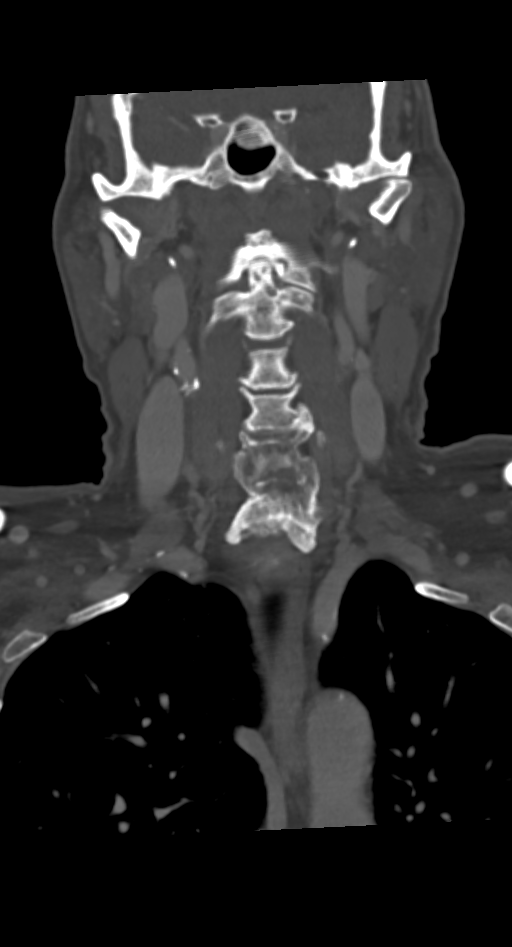

[Series 7: sagittals neck 2.00 sag · sagittal · 0.56mm/px · 5 of 109 slices shown, 6 images]
[im 37/109  bone]
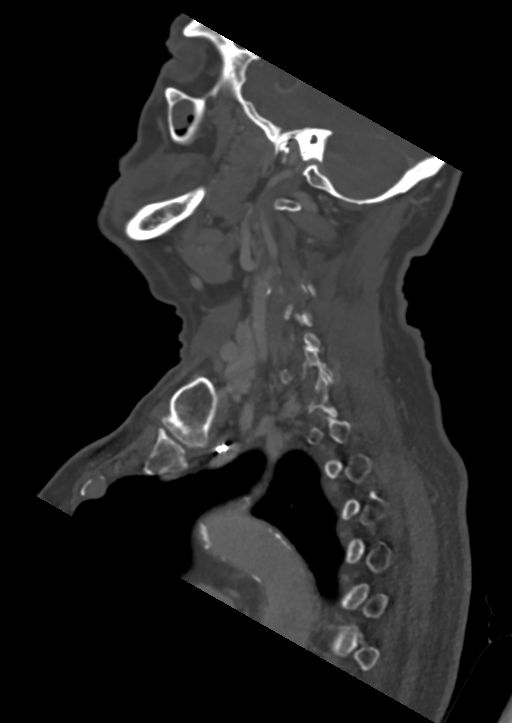
[im 46/109  bone]
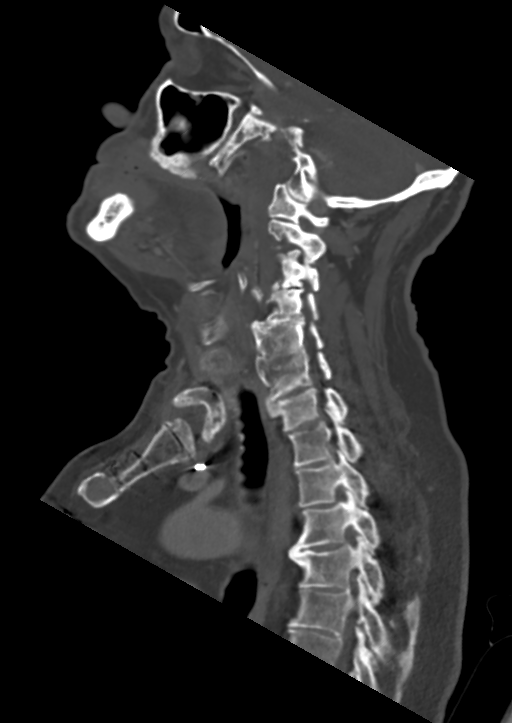
[im 55/109  soft-tissue]
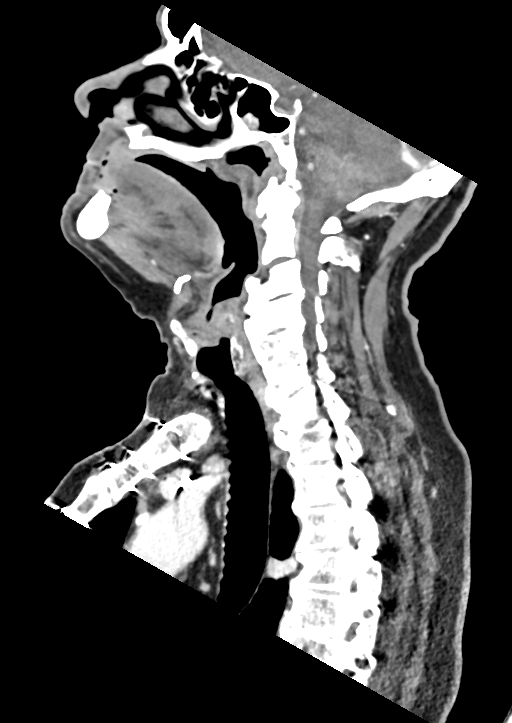
[im 55/109  bone]
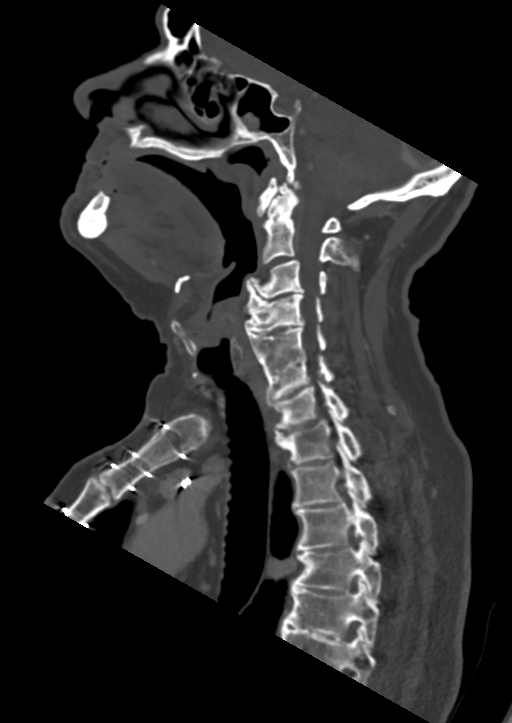
[im 64/109  bone]
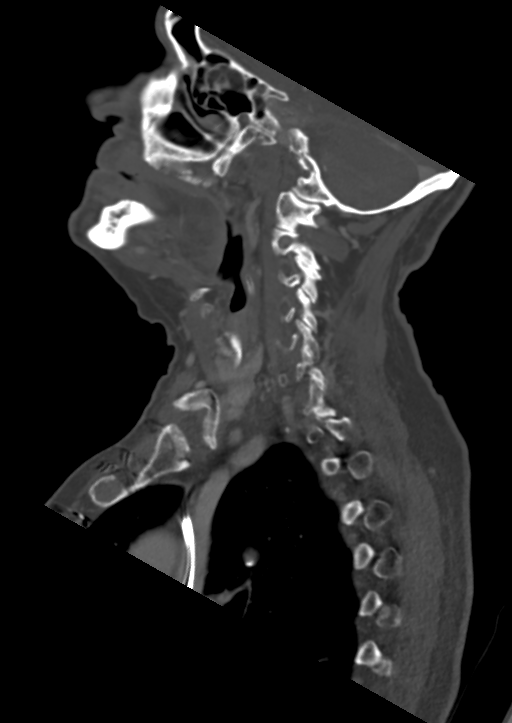
[im 73/109  bone]
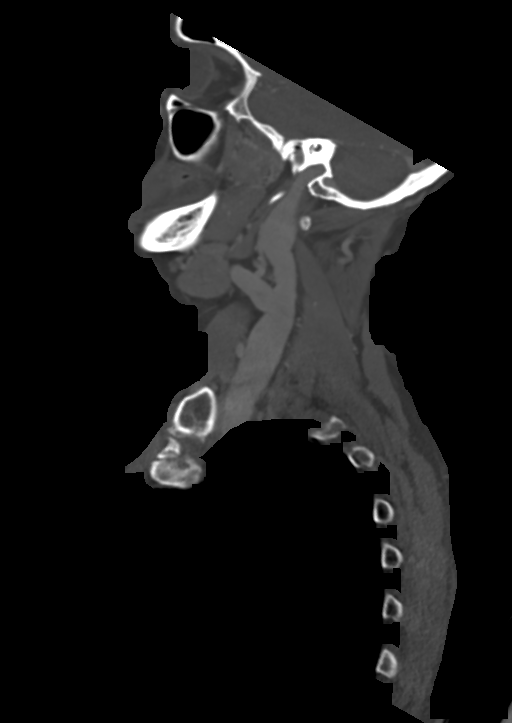

[Series 9: ax oropharynx neck 2.00 ax · axial · 0.43mm/px · z∈[-824,-615]mm · 3 of 202 slices shown, 4 images]
[im 41/202  soft-tissue]
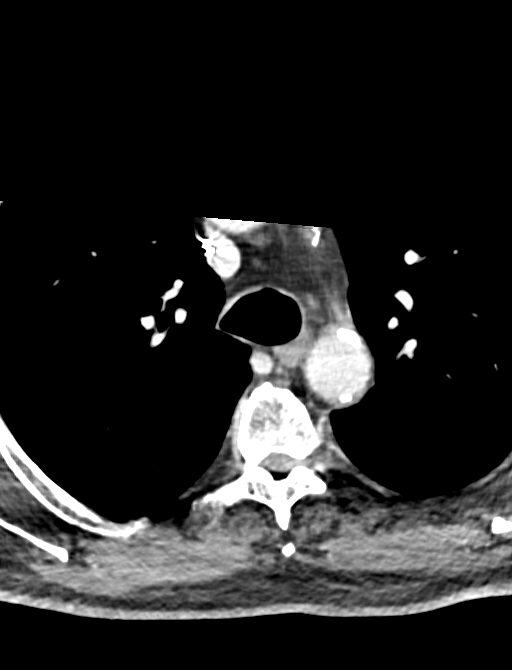
[im 41/202  bone]
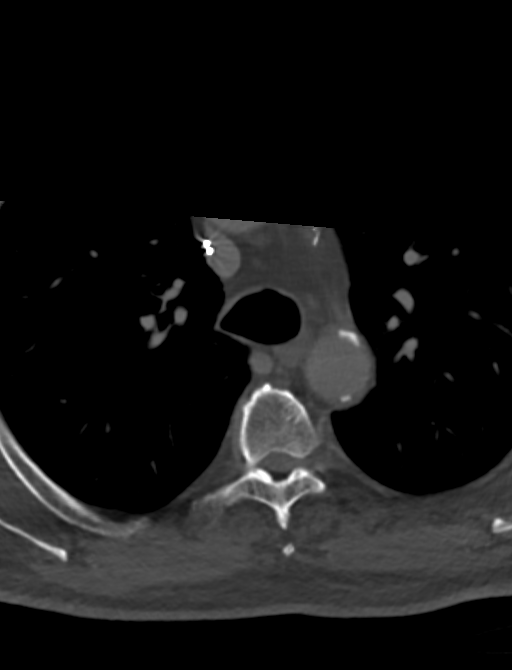
[im 121/202  bone]
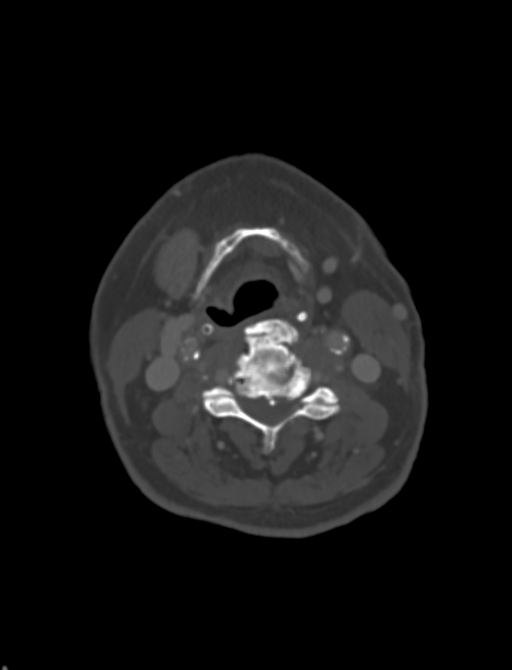
[im 161/202  bone]
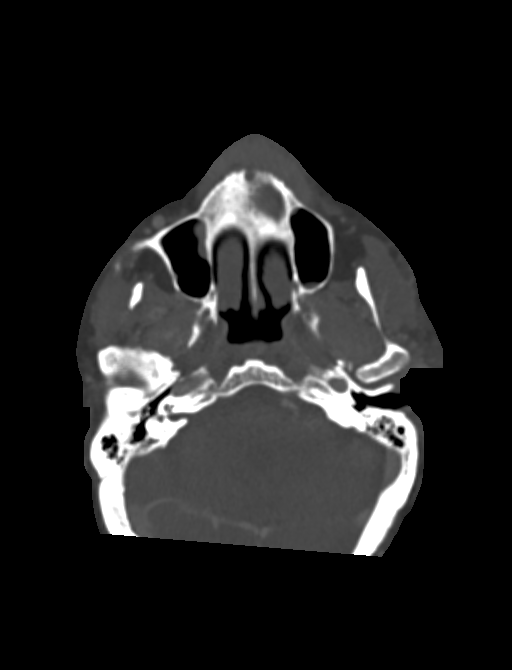

[11 of 33 positions shown; findings below may reference images not displayed]

FINDINGS: Pharynx and larynx: Stable contours. As before, prominent superior
extension of thyroid cartilage indents the left posterolateral
pharyngeal wall at the level of the superior epiglottis. The glottis
was closed during this study.

Salivary glands: Unremarkable.

Thyroid: Normal.

Lymph nodes: There are no enlarged or abnormal density lymph nodes.
Additionally, representative lymph nodes on the prior study have
significantly decreased in size.

Vascular: Major neck vessels are patent. Similar plaque at the ICA
origins. Left chest wall pacemaker.

Limited intracranial: No abnormal enhancement.

Visualized orbits: Unremarkable.

Mastoids and visualized paranasal sinuses: Mild polypoid mucosal
thickening. Mastoid air cells are clear.

Skeleton: Advanced degenerative changes of the cervical spine.

Upper chest: No apical lung mass.  The chest is dictated separately.

Other: None.
IMPRESSION: Continued decrease in size of lymph nodes.  No new enlarged nodes.

## 2020-02-20 IMAGING — CT CT ABD-PELV W/ CM
3 of 5 series · 9 of 33 positions shown, 10 images · IV contrast (omnipaque)
Comparison: Prior study [DATE] and [DATE].

CLINICAL DATA: History of chronic lymphocytic leukemia for routine
follow-up. No current complaints.

EXAM:
CT CHEST, ABDOMEN, AND PELVIS WITH CONTRAST
TECHNIQUE: Multidetector CT imaging of the chest, abdomen and pelvis was
performed following the standard protocol during bolus
administration of intravenous contrast.
CONTRAST:  100mL OMNIPAQUE IOHEXOL 300 MG/ML  SOLN

[Series 4: cap with (person_name) 5.00 ax · axial · 0.77mm/px · z∈[-938,-938]mm · 1 of 145 slices shown, 2 images]
[im 87/145  soft-tissue]
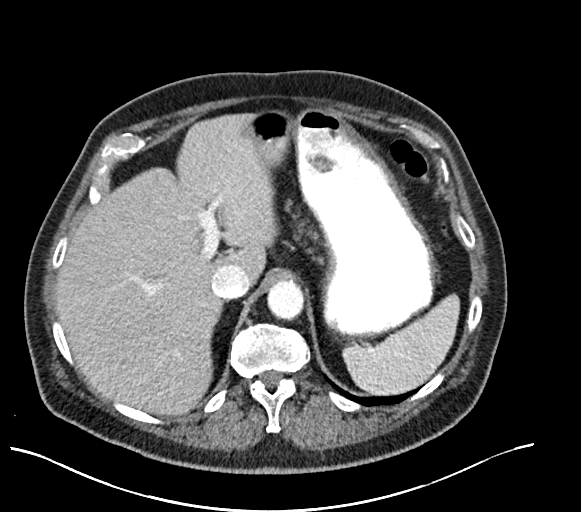
[im 87/145  bone]
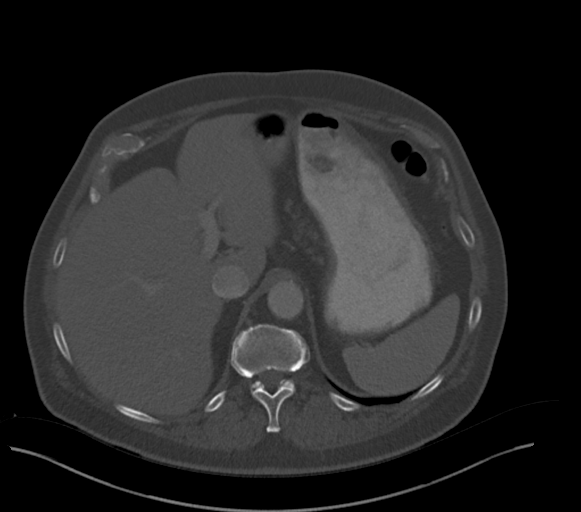

[Series 8: coronal cap with (person_name) 2.00 cor · coronal · 0.88mm/px · 3 of 156 slices shown]
[im 32/156  bone]
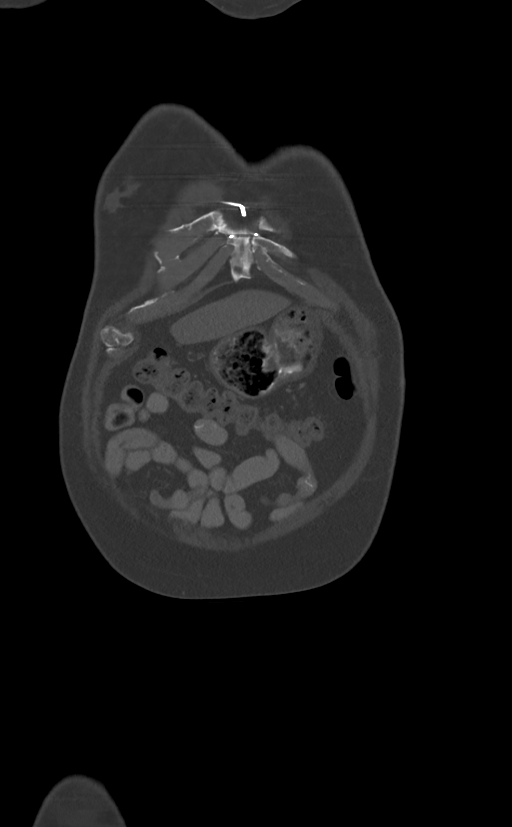
[im 63/156  bone]
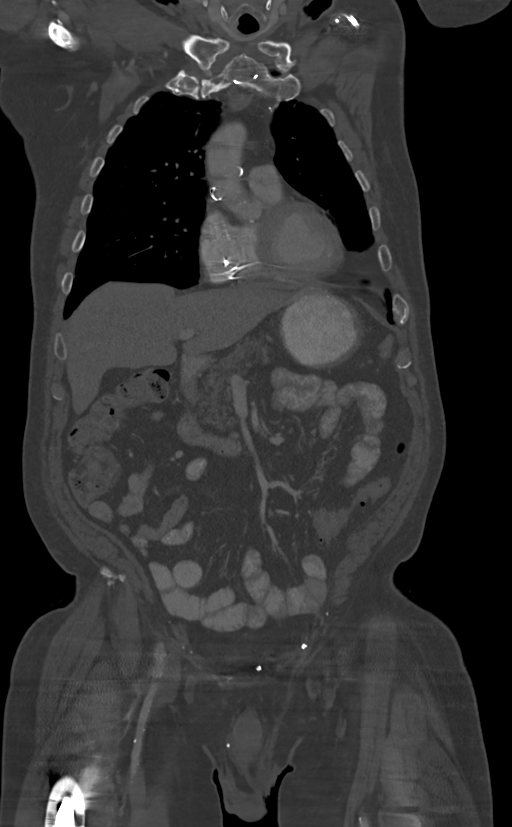
[im 94/156  bone]
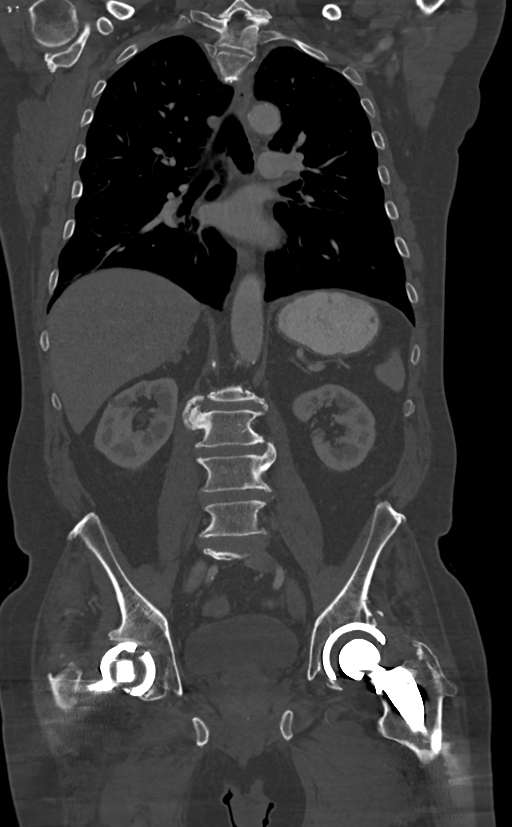

[Series 10: sagittal cap with (person_name) 2.00 sag · sagittal · 0.65mm/px · 5 of 224 slices shown]
[im 75/224  bone]
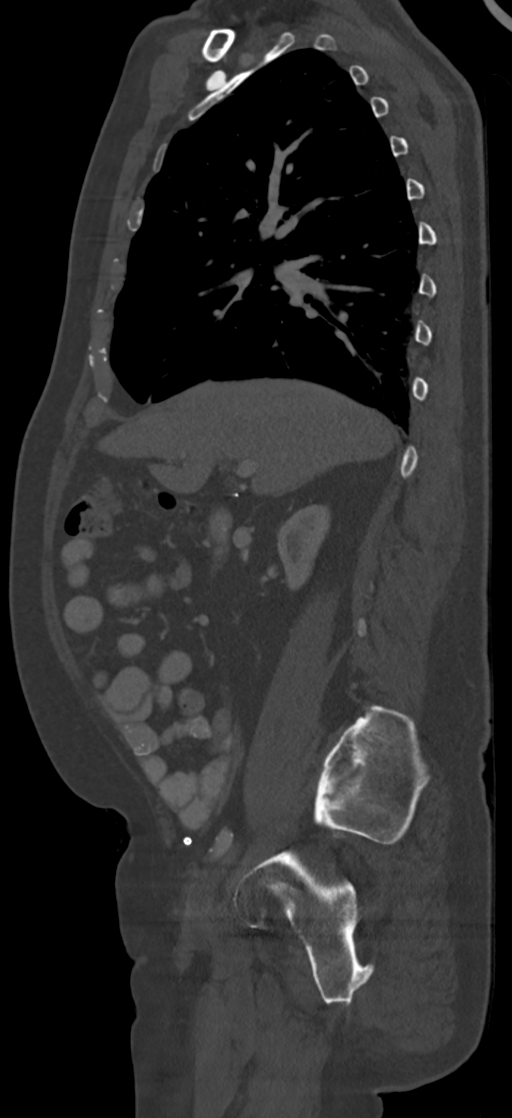
[im 93/224  bone]
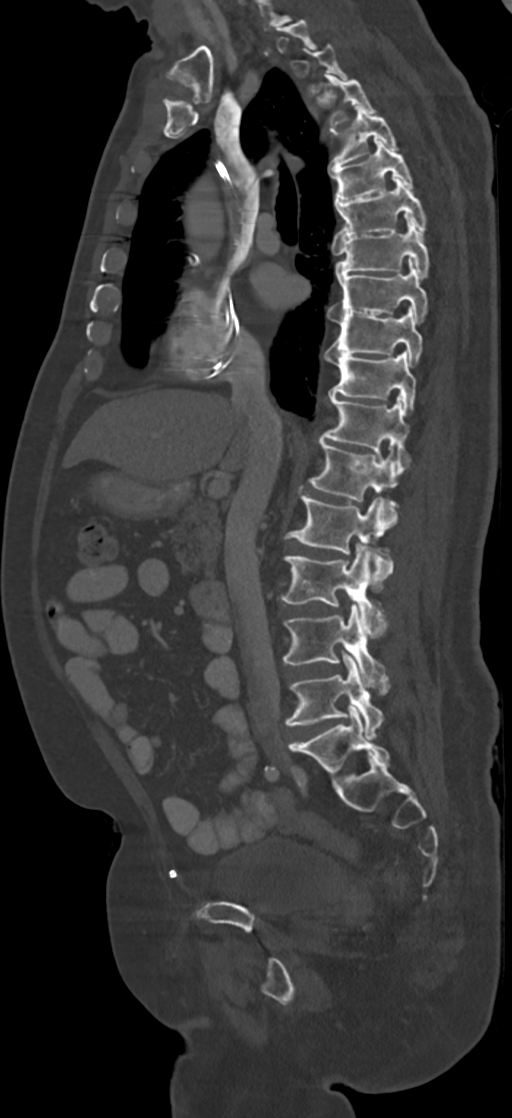
[im 112/224  bone]
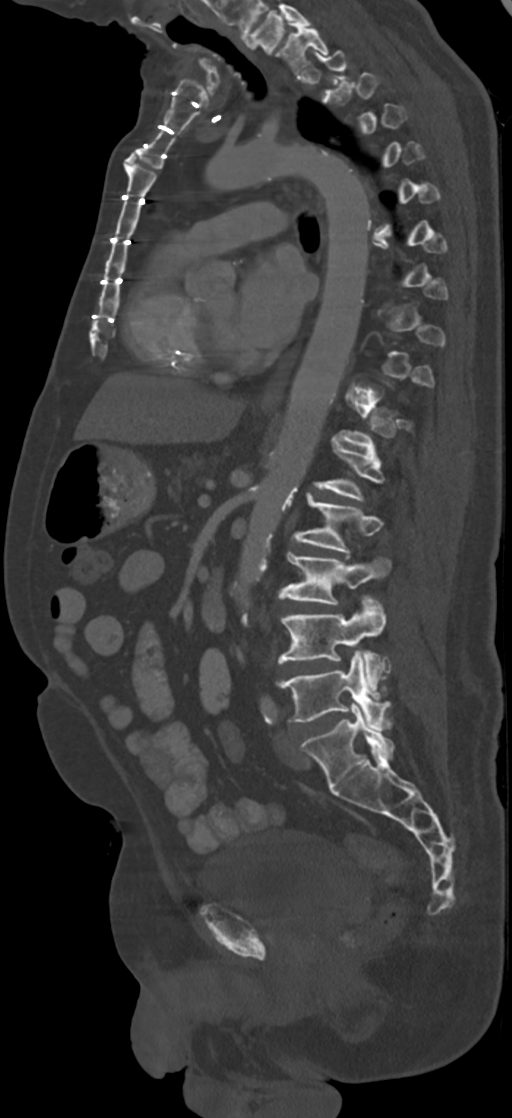
[im 131/224  bone]
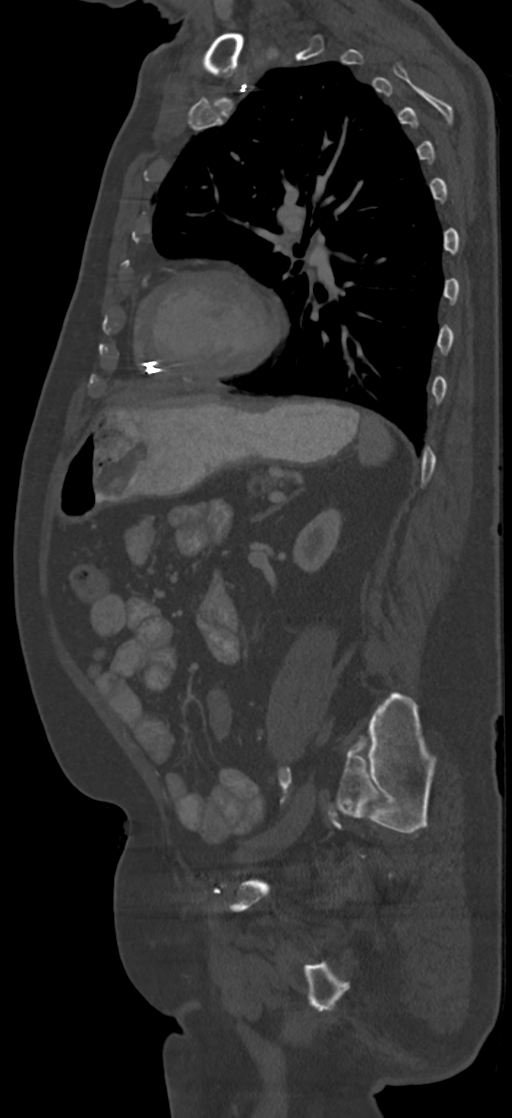
[im 149/224  bone]
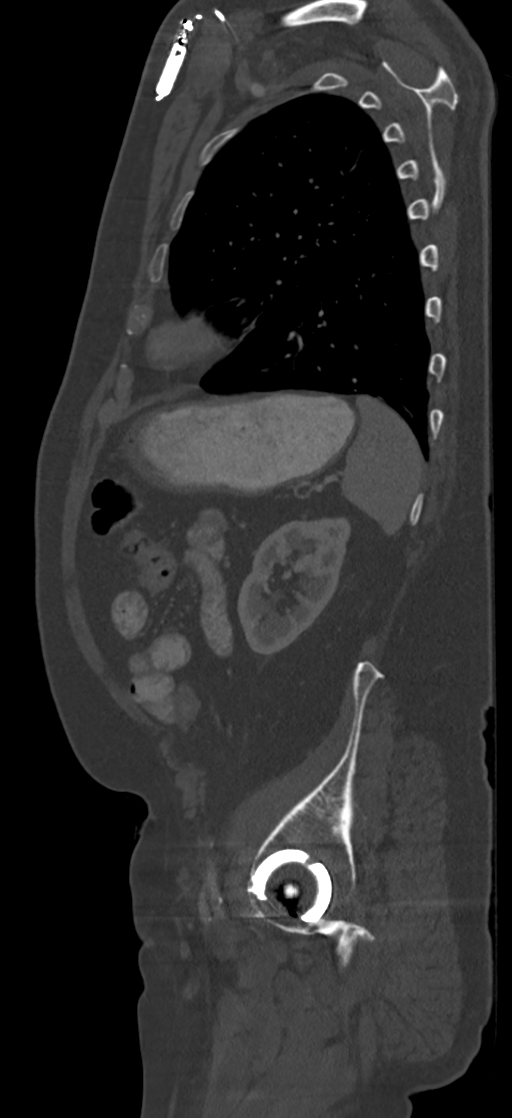

[9 of 33 positions shown; findings below may reference images not displayed]

FINDINGS: CT CHEST FINDINGS

Cardiovascular: There is atherosclerosis of the aorta, great vessels
and coronary arteries status post median sternotomy and CABG. Left
subclavian pacemaker leads extend into the right atrium and right
ventricle. Probable aortic valvular calcifications. The heart size
is normal. There is no pericardial effusion.

Mediastinum/Nodes: There are no enlarged mediastinal, hilar or
axillary lymph nodes. The thyroid gland, trachea and esophagus
demonstrate no significant findings.

Lungs/Pleura: No pleural effusion or pneumothorax. Mild
centrilobular and paraseptal emphysema with diffuse central airway
thickening. There is chronic atelectasis at the lung bases and the
lingula. No suspicious pulmonary nodule.

Musculoskeletal/Chest wall: No chest wall mass or suspicious osseous
findings. Stable appearance of the median sternotomy.

CT ABDOMEN AND PELVIS FINDINGS

Hepatobiliary: The liver is normal in density without suspicious
focal abnormality. No significant biliary dilatation status post
cholecystectomy.

Pancreas: Unremarkable. No pancreatic ductal dilatation or
surrounding inflammatory changes.

Spleen: Normal in size without focal abnormality.

Adrenals/Urinary Tract: Both adrenal glands appear normal. Stable
parapelvic cyst in the upper pole of the right kidney. No evidence
of renal mass, urinary tract calculus or hydronephrosis. The bladder
appears unremarkable.

Stomach/Bowel: No evidence of bowel wall thickening, distention or
surrounding inflammatory change. Mild sigmoid diverticulosis.

Vascular/Lymphatic: There are no enlarged abdominal or pelvic lymph
nodes. Scattered small residual retroperitoneal lymph nodes are
stable. There is diffuse aortic and branch vessel atherosclerosis
with a stable penetrating ulcer of the infrarenal abdominal aorta.
This results in an aneurysm with a greatest AP dimension of 3.2 cm,
unchanged from the previous study (remeasured). No acute vascular
findings are seen. The portal, superior mesenteric and splenic veins
are patent.

Reproductive: The prostate gland is partly obscured by artifact from
the total hip arthroplasties but appears grossly stable.

Other: Stable postsurgical changes consistent with bilateral
inguinal herniorrhaphy. No recurrent hernia or ascites.

Musculoskeletal: No acute or significant osseous findings. Previous
bilateral total hip arthroplasty. Multilevel lumbar spondylosis.
IMPRESSION: 1. No evidence of recurrent adenopathy or splenomegaly. Small
retroperitoneal lymph nodes are unchanged.
2. No acute findings in the chest, abdomen or pelvis.
3. Stable penetrating ulcer and infrarenal abdominal aortic aneurysm
with a greatest AP dimension of 3.2 cm. Recommend follow-up every 3
years. This recommendation follows ACR consensus guidelines: White
Paper of the ACR Incidental Findings Committee II on Vascular
Findings. [HOSPITAL] [5H]; [DATE].
4. Aortic Atherosclerosis ([5H]-[5H]) and Emphysema ([5H]-[5H]).

## 2020-02-20 MED ORDER — IOHEXOL 300 MG/ML  SOLN
100.0000 mL | Freq: Once | INTRAMUSCULAR | Status: AC | PRN
  Administered 2020-02-20: 100 mL via INTRAVENOUS

## 2020-02-20 NOTE — Assessment & Plan Note (Signed)
-   Doing well; compliant with CPAP and reports benefit from use  - Reports pressure on CPAP is set at 4cm h20 and having some difficulty with full face mask riding up at night. He is having moderate amount of airleaks  - Change CPAP pressure 8cm h20 - Send in order for nasal pillow mask with chin strip  - Advised patient not to drive if experiencing excessive daytime fatigue or somnolence - FU in 6 weeks televisit with Eustaquio Maize NP

## 2020-02-20 NOTE — Patient Instructions (Addendum)
Pleasure meeting you today [name]  Orders: Change CPAP pressure 8cm h20 Send in order for nasal pillow mask with chin strip   Sleep apnea: Continue to wear CPAP for 6 hours or more Do not drive if experiencing excessive daytime fatigue or somolence  COPD: Continue Symbicort and Spiriva  Follow-up: 6 weeks televisit with Derl Barrow, NP 6 months with Dr. Mortimer Fries (new patient)   CPAP and BPAP Information CPAP and BPAP are methods of helping a person breathe with the use of air pressure. CPAP stands for "continuous positive airway pressure." BPAP stands for "bi-level positive airway pressure." In both methods, air is blown through your nose or mouth and into your air passages to help you breathe well. CPAP and BPAP use different amounts of pressure to blow air. With CPAP, the amount of pressure stays the same while you breathe in and out. With BPAP, the amount of pressure is increased when you breathe in (inhale) so that you can take larger breaths. Your health care provider will recommend whether CPAP or BPAP would be more helpful for you. Why are CPAP and BPAP treatments used? CPAP or BPAP can be helpful if you have:  Sleep apnea.  Chronic obstructive pulmonary disease (COPD).  Heart failure.  Medical conditions that weaken the muscles of the chest including muscular dystrophy, or neurological diseases such as amyotrophic lateral sclerosis (ALS).  Other problems that cause breathing to be weak, abnormal, or difficult. CPAP is most commonly used for obstructive sleep apnea (OSA) to keep the airways from collapsing when the muscles relax during sleep. How is CPAP or BPAP administered? Both CPAP and BPAP are provided by a small machine with a flexible plastic tube that attaches to a plastic mask. You wear the mask. Air is blown through the mask into your nose or mouth. The amount of pressure that is used to blow the air can be adjusted on the machine. Your health care provider will  determine the pressure setting that should be used based on your individual needs. When should CPAP or BPAP be used? In most cases, the mask only needs to be worn during sleep. Generally, the mask needs to be worn throughout the night and during any daytime naps. People with certain medical conditions may also need to wear the mask at other times when they are awake. Follow instructions from your health care provider about when to use the machine. What are some tips for using the mask?   Because the mask needs to be snug, some people feel trapped or closed-in (claustrophobic) when first using the mask. If you feel this way, you may need to get used to the mask. One way to do this is by holding the mask loosely over your nose or mouth and then gradually applying the mask more snugly. You can also gradually increase the amount of time that you use the mask.  Masks are available in various types and sizes. Some fit over your mouth and nose while others fit over just your nose. If your mask does not fit well, talk with your health care provider about getting a different one.  If you are using a mask that fits over your nose and you tend to breathe through your mouth, a chin strap may be applied to help keep your mouth closed.  The CPAP and BPAP machines have alarms that may sound if the mask comes off or develops a leak.  If you have trouble with the mask, it is very  important that you talk with your health care provider about finding a way to make the mask easier to tolerate. Do not stop using the mask. Stopping the use of the mask could have a negative impact on your health. What are some tips for using the machine?  Place your CPAP or BPAP machine on a secure table or stand near an electrical outlet.  Know where the on/off switch is located on the machine.  Follow instructions from your health care provider about how to set the pressure on your machine and when you should use it.  Do not eat or  drink while the CPAP or BPAP machine is on. Food or fluids could get pushed into your lungs by the pressure of the CPAP or BPAP.  Do not smoke. Tobacco smoke residue can damage the machine.  For home use, CPAP and BPAP machines can be rented or purchased through home health care companies. Many different brands of machines are available. Renting a machine before purchasing may help you find out which particular machine works well for you.  Keep the CPAP or BPAP machine and attachments clean. Ask your health care provider for specific instructions. Get help right away if:  You have redness or open areas around your nose or mouth where the mask fits.  You have trouble using the CPAP or BPAP machine.  You cannot tolerate wearing the CPAP or BPAP mask.  You have pain, discomfort, and bloating in your abdomen. Summary  CPAP and BPAP are methods of helping a person breathe with the use of air pressure.  Both CPAP and BPAP are provided by a small machine with a flexible plastic tube that attaches to a plastic mask.  If you have trouble with the mask, it is very important that you talk with your health care provider about finding a way to make the mask easier to tolerate. This information is not intended to replace advice given to you by your health care provider. Make sure you discuss any questions you have with your health care provider. Document Revised: 10/27/2018 Document Reviewed: 05/26/2016 Elsevier Patient Education  [employer].

## 2020-02-20 NOTE — Progress Notes (Signed)
@Patient  ID: [name], male    DOB: 1946-05-03, 74 y.o.   MRN: [id number]  Chief Complaint  Patient presents with  . Follow-up    wearing cpap avg 5hr nightly- pressure is okay--would like to try a different mask. c/o sob with exertion and occ wheezing.    Referring provider: Leone Haven, MD  HPI: 74 year old male, former smoker. PMH significant for COPD, OSA. Former patient of Dr. Alva Garnet, last seen 01/18/2018. Maintained on CPAP for sleep apnea. Continue Symbicort 160 and Spiriva.   02/20/2020 Patient presents today for regular follow-up. He hasn't been seen in over 2 years. PCP concerned CPAP pressure is not high enough for him. Patient states that he sleeps pretty well at night. He is getting about 5-6 hours of sleep each night. He does not wake up frequently, reports some restlessness. Wife states that he snore without the mask. He states that his CPAP pressure is set at 4. He uses a full mask which he states rides up at night. He has tried nasal mask but was snoring. DME company [employer].   Breathing is about the same. No significant complaints today. He has had no recent COPD exacerbations or bronchitis. He is consistent with using Symbicort/Spiriva. 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 He was recently in the hospital in July for 1 night for chest pain. Troponin negative. Presentation inconsistent with cardiac origin, seen by cardiology. Denies re-currrence of chest pain.    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 Probable mild obstruction (FEV1 70% pred) with gas trapping (RV 134% pred) and mild-mod decrease in DLCO PSG (07/25/15): Overall AHI < 10/hr but > 100/hr in supine position. ROV  (07/30/15): Dyspnea on exertion is moderately improved with scheduled use of Symbicort. He is also on Spiriva, PRN albuterol with rare use of albuterol MDI. PSG results not available. Continue same COPD regimen CPAP titration (08/29/15): recommend full mask with CPAP 8 cm H2O CPAP compliance 11/26-12/25/18: Usage 30/30. Greater than 4 hrs: 27/30.   No Known Allergies  Immunization History  Administered Date(s) Administered  . Influenza Split 05/21/2012  . Influenza, High Dose Seasonal PF 04/21/2018  . Influenza,inj,Quad PF,6+ Mos 03/16/2014, 06/20/2015, 04/27/2016  . PFIZER SARS-COV-2 Vaccination 09/15/2019, 10/11/2019  . Pneumococcal Conjugate-13 07/05/2015  . Pneumococcal Polysaccharide-23 01/07/2011  . Tdap 01/14/2018    Past Medical History:  Diagnosis Date  . Anxiety   . Arthritis   . Atrial fibrillation ([place])    a. Dx 2013, recurred 02/2014, CHA2DS2VASc = 3 -->placed on Eliquis;  b. 02/2014 Echo: EF 50-55%, mid and apical anterior septum and mid and apical inf septum are abnl, mild to mod Ao sclerosis w/o AS.  Marland Kitchen Cancer associated pain   . Chicken pox   . Chronic lymphocytic leukemia ([place])    a. Dx 02/2014.  Marland Kitchen CLL (chronic lymphocytic leukemia) ([place])   . Complication of anesthesia    History of  PTSD--do not touch patient when waking up from surgery.  Marland Kitchen COPD (chronic obstructive pulmonary disease) ([place])   . Coronary artery disease    a. 04/2009 CABG x 3 (LIMA->LAD, VG->OM1, VG->PDA);  b. 09/2009 Cath: occluded VG x 2 w/ patent LIMA and L->R collats. EF  55%, mild antlat HK;  c. 10/2011 MV: EF 53%, no isch/infarct-->low risk.  Marland Kitchen Dysrhythmia    hx of a-fib  . GERD (gastroesophageal reflux disease)    occasional  . History of chemotherapy 2015-2016  . HOH (hard of hearing)    Bilateral Hearing Aids  . Hypertension   . Myocardial infarction ([place]) 2010  . OSA on CPAP    USE C-PAP  . Presence of permanent cardiac pacemaker 2017  . PTSD (post-traumatic stress disorder)   . PTSD  (post-traumatic stress disorder)   . Pure hypercholesterolemia   . Rheumatic fever 1959  . 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 TIA (transient ischemic attack) 11/02/2015    Tobacco History: Social History   Tobacco Use  Smoking Status Former Smoker  . Packs/day: 1.00  . Years: 40.00  . Pack years: 40.00  . Types: Cigarettes  . Quit date: 07/21/2006  . Years since quitting: 13.5  Smokeless Tobacco Never Used   Counseling given: Not Answered   Outpatient Medications Prior to Visit  Medication Sig Dispense Refill  . acetaminophen (TYLENOL) 500 MG tablet Take 1,000 mg by mouth every 8 (eight) hours as needed for mild pain.    Marland Kitchen acyclovir (ZOVIRAX) 400 MG tablet Take 1 tablet (400 mg total) by mouth 2 (two) times daily. 60 tablet 3  . albuterol (PROVENTIL HFA;VENTOLIN HFA) 108 (90 Base) MCG/ACT inhaler Inhale 2 puffs into the lungs every 6 (six) hours as needed for wheezing or shortness of breath. 1 Inhaler 11  . amiodarone (PACERONE) 200 MG tablet Take 1/2 (one-half) tablet by mouth twice daily 90 tablet 3  . apixaban (ELIQUIS) 5 MG TABS tablet Take 5 mg by mouth 2 (two) times daily.    Marland Kitchen atorvastatin (LIPITOR) 40 MG tablet Take 40 mg by mouth daily. Start back slowly with 20 mg working up on slow titration to 40mg  per Dr Rockey Situ.    . budesonide-formoterol (SYMBICORT) 160-4.5 MCG/ACT inhaler Inhale 2 puffs into the lungs 2 (two) times daily. 1 Inhaler 11  . cetirizine (ZYRTEC) 10 MG tablet Take 10 mg by mouth daily as needed for allergies.     . Coenzyme Q10 (COQ10) 200 MG CAPS Take 200 mg by mouth daily.    Marland Kitchen ezetimibe (ZETIA) 10 MG tablet Take 1 tablet (10 mg total) by mouth daily. 90 tablet 3  . furosemide (LASIX) 20 MG tablet Take 1 tablet (20 mg) by mouth twice daily as needed     . Krill Oil 350 MG CAPS Take 350 mg by mouth as needed.     . metoprolol tartrate (LOPRESSOR) 50 MG tablet Take 1 tablet (50 mg total) by mouth 2 (two) times daily. 60 tablet 0  .  mirtazapine (REMERON) 15 MG tablet Take 15 mg by mouth at bedtime as needed (for panic associated with PTSD).     . Multiple Vitamin (MULTIVITAMIN WITH MINERALS) TABS tablet Take 1 tablet by mouth daily.    . nitroGLYCERIN (NITROSTAT) 0.4 MG SL tablet Place 1 tablet (0.4 mg total) under the tongue every 5 (five) minutes as needed for chest pain. 25 tablet 6  . traMADol (ULTRAM) 50 MG tablet Take 1 tablet (50 mg total) by mouth every 12 (twelve) hours as needed. 60 tablet 0  . venetoclax (VENCLEXTA) 100 MG TABS Take 200 mg by mouth daily. (Take 2 tablets daily) (Patient taking differently: Take 100 mg by mouth daily. ) 60 tablet 6   No facility-administered medications prior to  visit.   Review of Systems  Review of Systems  Constitutional: Negative.   Respiratory: Positive for shortness of breath. Negative for cough, chest tightness, wheezing and stridor.        DOE  Cardiovascular: Negative for chest pain.  Psychiatric/Behavioral: Negative for sleep disturbance.    Physical Exam  BP 140/78 (BP Location: Left Arm, Cuff Size: Normal)   Pulse 63   Temp 98.2 F (36.8 C) (Temporal)   Ht 5\' 8"  (1.727 m)   Wt 219 lb (99.3 kg)   SpO2 98%   BMI 33.30 kg/m  Physical Exam Constitutional:      Appearance: Normal appearance.  HENT:     Mouth/Throat:     Mouth: Mucous membranes are moist.     Pharynx: Oropharynx is clear.     Comments: Mallampati class I Cardiovascular:     Rate and Rhythm: Normal rate and regular rhythm.  Pulmonary:     Effort: Pulmonary effort is normal.     Breath sounds: Normal breath sounds. No wheezing or rhonchi.  Musculoskeletal:     Comments: Ambulates with cane  Neurological:     General: No focal deficit present.     Mental Status: He is alert and oriented to person, place, and time. Mental status is at baseline.  Psychiatric:        Mood and Affect: Mood normal.        Behavior: Behavior normal.        Thought Content: Thought content normal.         Judgment: Judgment normal.      Lab Results:  CBC    Component Value Date/Time   WBC 6.8 02/04/2020 0451   RBC 3.29 (L) 02/04/2020 0451   HGB 11.3 (L) 02/04/2020 0451   HGB 12.8 (L) 10/24/2014 0837   HCT 32.3 (L) 02/04/2020 0451   HCT 37.7 (L) 10/24/2014 0837   PLT 130 (L) 02/04/2020 0451   PLT 136 (L) 10/24/2014 0837   MCV 98.2 02/04/2020 0451   MCV 95 10/24/2014 0837   MCH 34.3 (H) 02/04/2020 0451   MCHC 35.0 02/04/2020 0451   RDW 14.2 02/04/2020 0451   RDW 14.6 (H) 10/24/2014 0837   LYMPHSABS 2.5 01/19/2020 0935   LYMPHSABS 0.8 (L) 10/24/2014 0837   MONOABS 1.1 (H) 01/19/2020 0935   MONOABS 0.6 10/24/2014 0837   EOSABS 0.1 01/19/2020 0935   EOSABS 0.2 10/24/2014 0837   BASOSABS 0.0 01/19/2020 0935   BASOSABS 0.0 10/24/2014 0837    BMET    Component Value Date/Time   NA 140 02/04/2020 0451   NA 139 10/11/2014 1800   K 3.4 (L) 02/04/2020 0451   K 3.3 (L) 10/11/2014 1800   CL 105 02/04/2020 0451   CL 106 10/11/2014 1800   CO2 25 02/04/2020 0451   CO2 27 10/11/2014 1800   GLUCOSE 122 (H) 02/04/2020 0451   GLUCOSE 107 (H) 10/11/2014 1800   BUN 13 02/04/2020 0451   BUN 15 10/11/2014 1800   CREATININE 1.08 02/04/2020 0451   CREATININE 0.89 10/11/2014 1800   CALCIUM 8.8 (L) 02/04/2020 0451   CALCIUM 8.8 (L) 10/11/2014 1800   GFRNONAA >60 02/04/2020 0451   GFRNONAA >60 10/11/2014 1800   GFRAA >60 02/04/2020 0451   GFRAA >60 10/11/2014 1800    BNP    Component Value Date/Time   BNP 111.1 (H) 02/03/2020 1254   BNP 47.3 02/18/2011 1318    ProBNP No results found for: PROBNP  Imaging: DG Chest 2  View  Result Date: 02/03/2020 CLINICAL DATA:  Onset chest pain, shortness of breath and weakness 2-3 hours ago. EXAM: CHEST - 2 VIEW COMPARISON:  PA and lateral chest 01/18/2018. CT chest, abdomen and pelvis 08/29/2019. FINDINGS: The patient is status post CABG with a pacing device in place. Aortic atherosclerosis. Heart size normal. Lungs clear. Nipple shadows  unchanged. No pneumothorax or pleural effusion. IMPRESSION: No acute disease. Aortic Atherosclerosis (ICD10-I70.0). Electronically Signed   By: Inge Rise M.D.   On: 02/03/2020 14:00   CT SOFT TISSUE NECK W CONTRAST  Result Date: 02/20/2020 CLINICAL DATA:  Lymphoma, follow-up EXAM: CT NECK WITH CONTRAST TECHNIQUE: Multidetector CT imaging of the neck was performed using the standard protocol following the bolus administration of intravenous contrast. CONTRAST:  135mL OMNIPAQUE IOHEXOL 300 MG/ML  SOLN COMPARISON:  02/23/2019 FINDINGS: Pharynx and larynx: Stable contours. As before, prominent superior extension of thyroid cartilage indents the left posterolateral pharyngeal wall at the level of the superior epiglottis. The glottis was closed during this study. Salivary glands: Unremarkable. Thyroid: Normal. Lymph nodes: There are no enlarged or abnormal density lymph nodes. Additionally, representative lymph nodes on the prior study have significantly decreased in size. Vascular: Major neck vessels are patent. Similar plaque at the ICA origins. Left chest wall pacemaker. Limited intracranial: No abnormal enhancement. Visualized orbits: Unremarkable. Mastoids and visualized paranasal sinuses: Mild polypoid mucosal thickening. Mastoid air cells are clear. Skeleton: Advanced degenerative changes of the cervical spine. Upper chest: No apical lung mass.  The chest is dictated separately. Other: None. IMPRESSION: Continued decrease in size of lymph nodes.  No new enlarged nodes. Electronically Signed   By: Macy Mis M.D.   On: 02/20/2020 12:28   ECHOCARDIOGRAM COMPLETE  Result Date: 02/04/2020    ECHOCARDIOGRAM REPORT   Patient Name:   TRENT GABLER Date of Exam: 02/04/2020 Medical Rec #:  [id number]      Height:       68.0 in Accession #:    [id number]     Weight:       215.3 lb Date of Birth:  1945-08-08      BSA:          2.109 m Patient Age:    46 years       BP:           154/79 mmHg Patient Gender: M               HR:           67 bpm. Exam Location:  [facility] Procedure: 2D Echo Indications:     Chest Pain  History:         Patient has prior history of Echocardiogram examinations. CHF,                  Prior CABG, COPD and TIA; Arrythmias:Atrial Fibrillation.  Sonographer:     L Thornton-Maynard Referring Phys:  [id number] Arvil Chaco Diagnosing Phys: Ida Rogue MD IMPRESSIONS  1. Left ventricular ejection fraction, by estimation, is 55 to 60%. The left ventricle has normal function. The left ventricle has no regional wall motion abnormalities. There is mild left ventricular hypertrophy. Left ventricular diastolic parameters are consistent with Grade I diastolic dysfunction (impaired relaxation).  2. Right ventricular systolic function is normal. The right ventricular size is normal. There is mildly elevated pulmonary artery systolic pressure. The estimated right ventricular systolic pressure is 95.0 mmHg.  3. The mitral valve is normal in structure. Mild mitral valve regurgitation.  4. Mild to moderate aortic valve sclerosis/calcification is present, without any evidence of aortic stenosis. FINDINGS  Left Ventricle: Left ventricular ejection fraction, by estimation, is 55 to 60%. The left ventricle has normal function. The left ventricle has no regional wall motion abnormalities. The left ventricular internal cavity size was normal in size. There is  mild left ventricular hypertrophy. Left ventricular diastolic parameters are consistent with Grade I diastolic dysfunction (impaired relaxation). Right Ventricle: The right ventricular size is normal. No increase in right ventricular wall thickness. Right ventricular systolic function is normal. There is mildly elevated pulmonary artery systolic pressure. The tricuspid regurgitant velocity is 2.93  m/s, and with an assumed right atrial pressure of 10 mmHg, the estimated right ventricular systolic pressure is 03.5 mmHg. Left Atrium: Left atrial size was normal in  size. Right Atrium: Right atrial size was normal in size. Pericardium: There is no evidence of pericardial effusion. Mitral Valve: The mitral valve is normal in structure. Normal mobility of the mitral valve leaflets. Mild mitral valve regurgitation. No evidence of mitral valve stenosis. MV peak gradient, 5.0 mmHg. The mean mitral valve gradient is 2.0 mmHg. Tricuspid Valve: The tricuspid valve is normal in structure. Tricuspid valve regurgitation is mild . No evidence of tricuspid stenosis. Aortic Valve: The aortic valve is normal in structure. Aortic valve regurgitation is not visualized. Mild to moderate aortic valve sclerosis/calcification is present, without any evidence of aortic stenosis. Aortic valve mean gradient measures 4.0 mmHg. Aortic valve peak gradient measures 8.9 mmHg. Aortic valve area, by VTI measures 2.80 cm. Pulmonic Valve: The pulmonic valve was normal in structure. Pulmonic valve regurgitation is not visualized. No evidence of pulmonic stenosis. Aorta: The aortic root is normal in size and structure. Venous: The inferior vena cava is normal in size with greater than 50% respiratory variability, suggesting right atrial pressure of 3 mmHg. IAS/Shunts: No atrial level shunt detected by color flow Doppler.  LEFT VENTRICLE PLAX 2D LVIDd:         4.59 cm  Diastology LVIDs:         3.33 cm  LV e' lateral:   9.46 cm/s LV PW:         1.37 cm  LV E/e' lateral: 9.5 LV IVS:        1.22 cm  LV e' medial:    5.87 cm/s LVOT diam:     2.20 cm  LV E/e' medial:  15.2 LV SV:         81 LV SV Index:   38 LVOT Area:     3.80 cm  RIGHT VENTRICLE RV S prime:     7.94 cm/s TAPSE (M-mode): 1.6 cm LEFT ATRIUM             Index LA diam:        3.60 cm 1.71 cm/m LA Vol (A2C):   56.8 ml 26.94 ml/m LA Vol (A4C):   53.9 ml 25.56 ml/m LA Biplane Vol: 57.3 ml 27.17 ml/m  AORTIC VALVE                   PULMONIC VALVE AV Area (Vmax):    2.38 cm    PV Vmax:       1.59 m/s AV Area (Vmean):   2.65 cm    PV Peak grad:  10.1  mmHg AV Area (VTI):     2.80 cm AV Vmax:           149.00 cm/s AV Vmean:  94.100 cm/s AV VTI:            0.288 m AV Peak Grad:      8.9 mmHg AV Mean Grad:      4.0 mmHg LVOT Vmax:         93.30 cm/s LVOT Vmean:        65.700 cm/s LVOT VTI:          0.212 m LVOT/AV VTI ratio: 0.74  AORTA Ao Root diam: 3.30 cm MITRAL VALVE               TRICUSPID VALVE MV Area (PHT): 2.83 cm    TR Peak grad:   34.3 mmHg MV Peak grad:  5.0 mmHg    TR Vmax:        293.00 cm/s MV Mean grad:  2.0 mmHg MV Vmax:       1.12 m/s    SHUNTS MV Vmean:      72.6 cm/s   Systemic VTI:  0.21 m MV Decel Time: 268 msec    Systemic Diam: 2.20 cm MV E velocity: 89.50 cm/s MV A velocity: 91.30 cm/s MV E/A ratio:  0.98 Ida Rogue MD Electronically signed by Ida Rogue MD Signature Date/Time: 02/04/2020/10:52:32 PM    Final    CT Angio Chest/Abd/Pel for Dissection W and/or W/WO  Result Date: 02/03/2020 CLINICAL DATA:  Chest pain, abdominal pain EXAM: CT ANGIOGRAPHY CHEST, ABDOMEN AND PELVIS TECHNIQUE: Non-contrast CT of the chest was initially obtained. Multidetector CT imaging through the chest, abdomen and pelvis was performed using the standard protocol during bolus administration of intravenous contrast. Multiplanar reconstructed images and MIPs were obtained and reviewed to evaluate the vascular anatomy. CONTRAST:  13mL OMNIPAQUE IOHEXOL 350 MG/ML SOLN COMPARISON:  Three/11/2018 FINDINGS: CTA CHEST FINDINGS Cardiovascular: Coronary artery bypass grafting has been performed. The thoracic aorta is normal in course and caliber. No evidence of aneurysm, intramural hematoma, or dissection. Minimal atherosclerotic calcification within the aortic arch and descending thoracic aorta. Arch vasculature demonstrates normal anatomic configuration and is widely patent proximally. Extensive multivessel native coronary artery calcification. Global cardiac size within normal limits. Left subclavian dual lead pacemaker is seen with leads within  the right atrium and right ventricle toward the apex. Aortic valve is tricuspid. No pericardial effusion. Central pulmonary arteries are of normal caliber. Mediastinum/Nodes: No pathologic thoracic adenopathy. Lungs/Pleura: The lungs are symmetrically well expanded. There are subtle subpleural interstitial infiltrates within the a dependent lower lobes bilaterally which are nonspecific. If chronic, these can be seen in the setting of smoking related lung disease (desquamative interstitial pneumonia), collagen vascular disease, or drug toxicity. These are extremely mild in severity. No confluent pulmonary infiltrates. No pneumothorax or pleural effusion. The central airways are widely patent. Musculoskeletal: Advanced degenerative changes are seen within the thoracic spine. No acute bone abnormality. Review of the MIP images confirms the above findings. 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 Moderate scattered atherosclerotic calcification. No dissection. 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 SMA: Unremarkable Renals: Dual right renal arteries. Single left renal artery. Widely patent. IMA: Patent. Inflow: Mild calcified atherosclerotic plaque. No aneurysm. No hemodynamically significant stenosis. Internal iliac arteries are patent bilaterally. Veins: Poorly opacified, but morphologically unremarkable. Review of the MIP images confirms the above findings. NON-VASCULAR Hepatobiliary: Status post cholecystectomy. Liver unremarkable. No  intra or extrahepatic biliary ductal dilation. Pancreas: Unremarkable Spleen: Unremarkable Adrenals/Urinary Tract: Adrenal glands are unremarkable. Right parapelvic cyst noted. The kidneys are otherwise unremarkable. The bladder is moderately distended, but is  otherwise unremarkable. Stomach/Bowel: Mild sigmoid diverticulosis. Stomach, small bowel, and large bowel are otherwise unremarkable. Appendix not visualized and likely absent. No free intraperitoneal gas or fluid. Lymphatic: There is shotty left periaortic lymphadenopathy present which has improved since prior examination and is compatible with the residua of treated disease. No frankly pathologic adenopathy within the abdomen and pelvis. Reproductive: Obscured by streak artifact from bilateral hip prostheses. Other: Bilateral inguinal hernia repair with mesh has been performed. Musculoskeletal: Bilateral total hip arthroplasty has been performed. Degenerative changes are seen within the lumbar spine. No acute bone abnormality. Review of the MIP images confirms the above findings. IMPRESSION: No evidence of thoracoabdominal aortic dissection or thoracic aortic aneurysm. 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 Ectatic abdominal aorta at risk for aneurysm development. Recommend followup by ultrasound in 5 years. This recommendation follows ACR consensus guidelines: White Paper of the ACR Incidental Findings Committee II on Vascular Findings. J Am Coll Radiol 2013; 10:789-794. Aortic aneurysm NOS (ICD10-I71.9) Marked interval improvement in retroperitoneal adenopathy in keeping with treated disease. No residual pathologic adenopathy identified. Aortic Atherosclerosis (ICD10-I70.0). Electronically Signed   By: Fidela Salisbury MD   On: 02/03/2020 18:13     Assessment & Plan:   OSA on CPAP - Doing well; compliant with CPAP and reports benefit from use  - Reports pressure on CPAP is set at 4cm h20 and having some difficulty with full face mask riding up at night. He is having moderate amount of airleaks  - Change CPAP pressure 8cm h20 - Send in order for nasal pillow mask with chin strip  - Advised patient not to drive if experiencing  excessive daytime fatigue or somnolence - FU in 6 weeks televisit with Eustaquio Maize NP  COPD (chronic obstructive pulmonary disease) (HCC) - Stable interval, no recent exacerbations  - Continue Symbicort 160 two puffs twice daily; Spiriva respimat 2 puffs once daily    Martyn Ehrich, NP 02/20/2020

## 2020-02-20 NOTE — Assessment & Plan Note (Signed)
-   Stable interval, no recent exacerbations  - Continue Symbicort 160 two puffs twice daily; Spiriva respimat 2 puffs once daily

## 2020-02-22 ENCOUNTER — Other Ambulatory Visit: Payer: Self-pay

## 2020-02-22 DIAGNOSIS — C9110 Chronic lymphocytic leukemia of B-cell type not having achieved remission: Secondary | ICD-10-CM

## 2020-02-23 ENCOUNTER — Inpatient Hospital Stay: Payer: Medicare HMO | Attending: Internal Medicine

## 2020-02-23 ENCOUNTER — Telehealth: Payer: Self-pay | Admitting: Pharmacy Technician

## 2020-02-23 ENCOUNTER — Other Ambulatory Visit: Payer: Self-pay

## 2020-02-23 ENCOUNTER — Inpatient Hospital Stay (HOSPITAL_BASED_OUTPATIENT_CLINIC_OR_DEPARTMENT_OTHER): Payer: Medicare HMO | Admitting: Internal Medicine

## 2020-02-23 DIAGNOSIS — M791 Myalgia, unspecified site: Secondary | ICD-10-CM | POA: Insufficient documentation

## 2020-02-23 DIAGNOSIS — C9110 Chronic lymphocytic leukemia of B-cell type not having achieved remission: Secondary | ICD-10-CM | POA: Diagnosis present

## 2020-02-23 DIAGNOSIS — I4891 Unspecified atrial fibrillation: Secondary | ICD-10-CM | POA: Diagnosis not present

## 2020-02-23 DIAGNOSIS — R0789 Other chest pain: Secondary | ICD-10-CM | POA: Diagnosis not present

## 2020-02-23 DIAGNOSIS — R197 Diarrhea, unspecified: Secondary | ICD-10-CM | POA: Diagnosis not present

## 2020-02-23 DIAGNOSIS — Z7901 Long term (current) use of anticoagulants: Secondary | ICD-10-CM | POA: Diagnosis not present

## 2020-02-23 LAB — CBC WITH DIFFERENTIAL/PLATELET
Abs Immature Granulocytes: 0.03 10*3/uL (ref 0.00–0.07)
Basophils Absolute: 0 10*3/uL (ref 0.0–0.1)
Basophils Relative: 0 %
Eosinophils Absolute: 0.1 10*3/uL (ref 0.0–0.5)
Eosinophils Relative: 1 %
HCT: 34.6 % — ABNORMAL LOW (ref 39.0–52.0)
Hemoglobin: 11.7 g/dL — ABNORMAL LOW (ref 13.0–17.0)
Immature Granulocytes: 0 %
Lymphocytes Relative: 25 %
Lymphs Abs: 1.8 10*3/uL (ref 0.7–4.0)
MCH: 34 pg (ref 26.0–34.0)
MCHC: 33.8 g/dL (ref 30.0–36.0)
MCV: 100.6 fL — ABNORMAL HIGH (ref 80.0–100.0)
Monocytes Absolute: 1 10*3/uL (ref 0.1–1.0)
Monocytes Relative: 14 %
Neutro Abs: 4.2 10*3/uL (ref 1.7–7.7)
Neutrophils Relative %: 60 %
Platelets: 149 10*3/uL — ABNORMAL LOW (ref 150–400)
RBC: 3.44 MIL/uL — ABNORMAL LOW (ref 4.22–5.81)
RDW: 14 % (ref 11.5–15.5)
WBC: 7.1 10*3/uL (ref 4.0–10.5)
nRBC: 0 % (ref 0.0–0.2)

## 2020-02-23 LAB — COMPREHENSIVE METABOLIC PANEL
ALT: 21 U/L (ref 0–44)
AST: 18 U/L (ref 15–41)
Albumin: 3.9 g/dL (ref 3.5–5.0)
Alkaline Phosphatase: 121 U/L (ref 38–126)
Anion gap: 9 (ref 5–15)
BUN: 20 mg/dL (ref 8–23)
CO2: 27 mmol/L (ref 22–32)
Calcium: 8.7 mg/dL — ABNORMAL LOW (ref 8.9–10.3)
Chloride: 105 mmol/L (ref 98–111)
Creatinine, Ser: 1.05 mg/dL (ref 0.61–1.24)
GFR calc Af Amer: >60 mL/min (ref >60)
GFR calc non Af Amer: >60 mL/min (ref >60)
Glucose, Bld: 129 mg/dL — ABNORMAL HIGH (ref 70–99)
Potassium: 3.9 mmol/L (ref 3.5–5.1)
Sodium: 141 mmol/L (ref 135–145)
Total Bilirubin: 1.2 mg/dL (ref 0.3–1.2)
Total Protein: 6.3 g/dL — ABNORMAL LOW (ref 6.5–8.1)

## 2020-02-23 LAB — LACTATE DEHYDROGENASE: LDH: 155 U/L (ref 98–192)

## 2020-02-23 NOTE — Telephone Encounter (Signed)
Oral Oncology Patient Advocate Encounter   Patient mentioned [employer] sent him a letter stating his grant was going to be ending on 03/07/2020.  I reapplied for assistance and patient was approved.  He is approved for an $8,700 grant from [employer] [facility]) to provide copayment coverage for Venclexta.  This will keep the out of pocket expense at $0.     I have spoken with the patient.    The billing information is as follows and has been shared with [place].   Member ID: [id number] Group ID: [id number] RxBin: [id number] Dates of Eligibility: 03/08/20 through 03/07/21  Fund:  [place] [facility] [place] Phone [phone] Fax [phone] 02/23/2020 2:13 PM

## 2020-02-23 NOTE — Assessment & Plan Note (Addendum)
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  STABLE  AUG 4th 2021- CT-CR; STABLE.    # Currently on Venoclax 100 mg [sec to diarrhea; DI with Amio]; continue at current dose.    # Diarrhea-G-1; Sec to [employer]- STABLE  # A.fib-on amiodarone [drug interaction-amiodarone] sinus rhythm-on Eliquis-STABLE  # Muscle pain-sec to Ventoclax-continue tramadol prn; STABLE.  # Atypical Chest pain- MSK on relaxant-stable.  DISP  Stable.OSITION:  # follow up in 65month MD; labs- cbc/cmp/ldh- Dr.B

## 2020-02-23 NOTE — Progress Notes (Signed)
[name] OFFICE PROGRESS NOTE  Patient Care Team: Leone Haven, MD as PCP - General (Family Medicine) [name], MD as Consulting Physician (Cardiology) Cammie Sickle, MD as Medical Oncologist (Hematology and Oncology)  Cancer Staging No matching staging information was found for the patient.   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  MARCH 2017- CT N/C/A/P- NED. STOP Ritux; surveillance   # AUG 2019- CT/PET- progression/NO transformation;  # NOV 2019- Progression; started Ibrutinib 420 mg/d. STOPPED in end of feb sec to extreme fatigue/joint pains/cramps  #November 01, 2018-start ibrutinib 280 mg a day; December 20, 2018-discontinue ibrutinib secondary multiple side effects.   #January 03, 2019 start [place][July]; finished Dec 2020-; [facility] [200 mg a day; DI-amiodarone]  #Mid March 2021-venetoclax dose reduced to 100 mg [second diarrhea].     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      INTERVAL HISTORY:  [name]89y.o.  male CLL-high risk currently on venetoclax is here for follow-up/review results of CT scan.  In the interim patient was admitted to hospital for atypical chest pain-post observation discharge.  Continues to have chronic myalgias bilateral lower extremities.  Chronic pain.  Not any worse.  No new lumps or bumps.  1-2 loose stools a day not any worse.    \Review of Systems  Constitutional: Positive for malaise/fatigue. Negative for chills, diaphoresis and fever.  HENT: Negative for nosebleeds and sore throat.   Eyes: Negative for double vision.  Respiratory: Negative for cough, hemoptysis, sputum production, shortness of breath and wheezing.   Cardiovascular: Negative for chest pain, palpitations, orthopnea and leg swelling.  Gastrointestinal: Negative for abdominal pain, blood in stool, constipation, heartburn, melena, nausea and vomiting.  Genitourinary: Negative for dysuria, frequency and urgency.  Musculoskeletal: Positive for back pain, joint pain and myalgias.  Skin: Negative.  Negative for itching and rash.  Neurological: Negative for dizziness, tingling, focal weakness and headaches.  Psychiatric/Behavioral: Negative for depression. The patient is not nervous/anxious and does not have insomnia.       PAST MEDICAL HISTORY :  Past Medical History:  Diagnosis Date  . Anxiety   . Arthritis   . Atrial fibrillation ([place]    a. Dx 2013, recurred 02/2014, CHA2DS2VASc = 3 -->placed on Eliquis;  b. 02/2014 Echo: EF 50-55%, mid and apical anterior septum and mid and apical inf septum are abnl, mild to mod Ao sclerosis w/o AS.  .Michael KitchenCancer associated pain   . Chicken pox   . Chronic lymphocytic leukemia ([place]    a. Dx 02/2014.  .Michael KitchenCLL (chronic  lymphocytic leukemia) ([place])   . Complication of anesthesia    History of  PTSD--do not touch patient when waking up from surgery.  [name] COPD (chronic obstructive pulmonary disease) ([place])   . Coronary artery disease    a. 04/2009 CABG x  3 (LIMA->LAD, VG->OM1, VG->PDA);  b. 09/2009 Cath: occluded VG x 2 w/ patent LIMA and L->R collats. EF 55%, mild antlat HK;  c. 10/2011 MV: EF 53%, no isch/infarct-->low risk.  [name] Dysrhythmia    hx of a-fib  . GERD (gastroesophageal reflux disease)    occasional  . History of chemotherapy 2015-2016  . HOH (hard of hearing)    Bilateral Hearing Aids  . Hypertension   . Myocardial infarction ([name]) 2010  . OSA on CPAP    USE C-PAP  . Presence of permanent cardiac pacemaker 2017  . PTSD (post-traumatic stress disorder)   . PTSD (post-traumatic stress disorder)   . Pure hypercholesterolemia   . Rheumatic fever 1959  . 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 TIA (transient ischemic attack) 11/02/2015    PAST SURGICAL HISTORY :   Past Surgical History:  Procedure Laterality Date  . ABDOMINAL HERNIA REPAIR    . APPENDECTOMY  06/21/1985  . CARDIAC CATHETERIZATION  2010; 2011   ; Dr [name]  . CORONARY ARTERY BYPASS GRAFT  04/2009   "CABG X3"  . EP IMPLANTABLE DEVICE N/A 03/03/2016   Procedure: Pacemaker Implant;  Surgeon: Michael Sprang, MD;  Location: [facility];  Service: Cardiovascular;  Laterality: N/A;  . FOREIGN BODY REMOVAL  1968   "shrapnel in my tailbone"  . INGUINAL HERNIA REPAIR Right   . INSERT / REPLACE / REMOVE PACEMAKER    . JOINT REPLACEMENT Right 2018  . LAPAROSCOPIC CHOLECYSTECTOMY    . TONSILLECTOMY AND ADENOIDECTOMY  1956  . TOTAL HIP ARTHROPLASTY Right 10/22/2016   Procedure: TOTAL HIP ARTHROPLASTY;  Surgeon: Michael Leep, MD;  Location: [facility];  Service: Orthopedics;  Laterality: Right;  . TOTAL HIP ARTHROPLASTY Left 11/04/2017   Procedure: TOTAL HIP ARTHROPLASTY;  Surgeon: Michael Leep, MD;  Location: [facility];  Service: Orthopedics;  Laterality: Left;    FAMILY HISTORY :   Family History  Problem Relation Age of Onset  . Heart disease Mother   . Heart attack Mother   . Coronary artery disease Other        family history     SOCIAL HISTORY:   Social History   Tobacco Use  . Smoking status: Former Smoker    Packs/day: 1.00    Years: 40.00    Pack years: 40.00    Types: Cigarettes    Quit date: 07/21/2006    Years since quitting: 13.6  . Smokeless tobacco: Never Used  Vaping Use  . Vaping Use: Former  Substance Use Topics  . Alcohol use: Not Currently  . Drug use: No    ALLERGIES:  has No Known Allergies.  MEDICATIONS:  Current Outpatient Medications  Medication Sig Dispense Refill  . acetaminophen (TYLENOL) 500 MG tablet Take 1,000 mg by mouth every 8 (eight) hours as needed for mild pain.    [name] acyclovir (ZOVIRAX) 400 MG tablet Take 1 tablet (400 mg total) by mouth 2 (two) times daily. 60 tablet 3  . albuterol (PROVENTIL HFA;VENTOLIN HFA) 108 (90 Base) MCG/ACT inhaler Inhale 2 puffs into the lungs every 6 (six) hours as needed for wheezing or shortness of breath. 1 Inhaler 11  . amiodarone (PACERONE)  200 MG tablet Take 1/2 (one-half) tablet by mouth twice daily 90 tablet 3  . apixaban (ELIQUIS) 5 MG TABS tablet Take 5 mg by mouth 2 (two) times daily.    [name] atorvastatin (LIPITOR) 40 MG tablet Take 40 mg by mouth daily. Start back slowly with 20 mg working up on slow titration to 69m per Dr GRockey Situ    . budesonide-formoterol (SYMBICORT) 160-4.5 MCG/ACT inhaler Inhale 2 puffs into the lungs 2 (two) times daily. 1 Inhaler 11  . cetirizine (ZYRTEC) 10 MG tablet Take 10 mg by mouth daily as needed for allergies.     . Coenzyme Q10 (COQ10) 200 MG CAPS Take 200 mg by mouth daily.    .Michael Kitchenezetimibe (ZETIA) 10 MG tablet Take 1 tablet (10 mg total) by mouth daily. 90 tablet 3  . furosemide (LASIX) 20 MG tablet Take 1 tablet (20 mg) by mouth twice daily as needed     . Krill Oil 350 MG CAPS Take 350 mg by mouth as needed.     . metoprolol tartrate (LOPRESSOR) 50 MG tablet Take 1 tablet (50 mg total) by mouth 2 (two) times daily. 60 tablet 0  . mirtazapine (REMERON) 15 MG tablet Take 15 mg by mouth at bedtime as  needed (for panic associated with PTSD).     . Multiple Vitamin (MULTIVITAMIN WITH MINERALS) TABS tablet Take 1 tablet by mouth daily.    . nitroGLYCERIN (NITROSTAT) 0.4 MG SL tablet Place 1 tablet (0.4 mg total) under the tongue every 5 (five) minutes as needed for chest pain. 25 tablet 6  . traMADol (ULTRAM) 50 MG tablet Take 1 tablet (50 mg total) by mouth every 12 (twelve) hours as needed. 60 tablet 0  . venetoclax (VENCLEXTA) 100 MG TABS Take 200 mg by mouth daily. (Take 2 tablets daily) (Patient taking differently: Take 100 mg by mouth daily. ) 60 tablet 6   No current facility-administered medications for this visit.    PHYSICAL EXAMINATION: ECOG PERFORMANCE STATUS: 1 - Symptomatic but completely ambulatory  BP (!) 151/84 (BP Location: Left Arm, Patient Position: Sitting, Cuff Size: Normal)   Pulse 69   Temp 97.8 F (36.6 C) (Tympanic)   Wt 214 lb 6.4 oz (97.3 kg)   SpO2 100%   BMI 32.60 kg/m   Filed Weights   02/23/20 0951  Weight: 214 lb 6.4 oz (97.3 kg)    Physical Exam Constitutional:      Comments: Patient is alone.  HENT:     Head: Normocephalic and atraumatic.     Mouth/Throat:     Pharynx: No oropharyngeal exudate.  Eyes:     Pupils: Pupils are equal, round, and reactive to light.  Cardiovascular:     Rate and Rhythm: Normal rate and regular rhythm.  Pulmonary:     Effort: No respiratory distress.     Breath sounds: No wheezing.  Abdominal:     General: Bowel sounds are normal. There is no distension.     Palpations: Abdomen is soft. There is no mass.     Tenderness: There is no abdominal tenderness. There is no guarding or rebound.  Musculoskeletal:        General: No tenderness. Normal range of motion.     Cervical back: Normal range of motion and neck supple.  Lymphadenopathy:     Comments: Resolved lymph nodes in the neck underarms.  Skin:    General: Skin is warm.  Neurological:     Mental Status: He is alert and  oriented to person, place, and  time.  Psychiatric:        Mood and Affect: Affect normal.     LABORATORY DATA:  I have reviewed the data as listed    Component Value Date/Time   NA 141 02/23/2020 0922   NA 139 10/11/2014 1800   K 3.9 02/23/2020 0922   K 3.3 (L) 10/11/2014 1800   CL 105 02/23/2020 0922   CL 106 10/11/2014 1800   CO2 27 02/23/2020 0922   CO2 27 10/11/2014 1800   GLUCOSE 129 (H) 02/23/2020 0922   GLUCOSE 107 (H) 10/11/2014 1800   BUN 20 02/23/2020 0922   BUN 15 10/11/2014 1800   CREATININE 1.05 02/23/2020 0922   CREATININE 0.89 10/11/2014 1800   CALCIUM 8.7 (L) 02/23/2020 0922   CALCIUM 8.8 (L) 10/11/2014 1800   PROT 6.3 (L) 02/23/2020 0922   PROT 6.7 05/18/2017 1048   PROT 6.4 (L) 10/11/2014 1800   ALBUMIN 3.9 02/23/2020 0922   ALBUMIN 4.3 05/18/2017 1048   ALBUMIN 4.1 10/11/2014 1800   AST 18 02/23/2020 0922   AST 23 10/11/2014 1800   ALT 21 02/23/2020 0922   ALT 22 10/11/2014 1800   ALKPHOS 121 02/23/2020 0922   ALKPHOS 61 10/11/2014 1800   BILITOT 1.2 02/23/2020 0922   BILITOT 0.7 05/18/2017 1048   BILITOT 0.9 10/11/2014 1800   GFRNONAA >60 02/23/2020 0922   GFRNONAA >60 10/11/2014 1800   GFRAA >60 02/23/2020 0922   GFRAA >60 10/11/2014 1800    No results found for: SPEP, UPEP  Lab Results  Component Value Date   WBC 7.1 02/23/2020   NEUTROABS 4.2 02/23/2020   HGB 11.7 (L) 02/23/2020   HCT 34.6 (L) 02/23/2020   MCV 100.6 (H) 02/23/2020   PLT 149 (L) 02/23/2020      Chemistry      Component Value Date/Time   NA 141 02/23/2020 0922   NA 139 10/11/2014 1800   K 3.9 02/23/2020 0922   K 3.3 (L) 10/11/2014 1800   CL 105 02/23/2020 0922   CL 106 10/11/2014 1800   CO2 27 02/23/2020 0922   CO2 27 10/11/2014 1800   BUN 20 02/23/2020 0922   BUN 15 10/11/2014 1800   CREATININE 1.05 02/23/2020 0922   CREATININE 0.89 10/11/2014 1800      Component Value Date/Time   CALCIUM 8.7 (L) 02/23/2020 0922   CALCIUM 8.8 (L) 10/11/2014 1800   ALKPHOS 121 02/23/2020 0922    ALKPHOS 61 10/11/2014 1800   AST 18 02/23/2020 0922   AST 23 10/11/2014 1800   ALT 21 02/23/2020 0922   ALT 22 10/11/2014 1800   BILITOT 1.2 02/23/2020 0922   BILITOT 0.7 05/18/2017 1048   BILITOT 0.9 10/11/2014 1800       RADIOGRAPHIC STUDIES: I have personally reviewed the radiological images as listed and agreed with the findings in the report. No results found.   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  STABLE  AUG 4th 2021- CT-CR; STABLE.    # Currently on Venoclax 100 mg [sec to diarrhea; DI with Amio]; continue at current dose.    # Diarrhea-G-1; Sec to [employer]- STABLE  # A.fib-on amiodarone [drug interaction-amiodarone] sinus rhythm-on Eliquis-STABLE  # Muscle pain-sec to Ventoclax-continue tramadol prn; STABLE.  # Atypical Chest pain- MSK on relaxant-stable.  DISP  Stable.OSITION:  # follow up in 110month MD;  labs- cbc/cmp/ldh- Dr.B    No orders of the defined types were placed in this encounter.  All questions were answered. The patient knows to call the clinic with any problems, questions or concerns.      Cammie Sickle, MD 02/23/2020 12:28 PM

## 2020-02-24 ENCOUNTER — Ambulatory Visit: Payer: Medicare HMO | Admitting: Family

## 2020-02-24 ENCOUNTER — Encounter: Payer: Self-pay | Admitting: Family

## 2020-02-24 VITALS — BP 120/90 | HR 67 | Ht 68.0 in | Wt 217.1 lb

## 2020-02-24 DIAGNOSIS — I480 Paroxysmal atrial fibrillation: Secondary | ICD-10-CM | POA: Diagnosis not present

## 2020-02-24 MED ORDER — METOPROLOL TARTRATE 50 MG PO TABS: 50.0000 mg | ORAL_TABLET | Freq: Two times a day (BID) | ORAL | 1 refills | Status: DC

## 2020-02-24 NOTE — Patient Instructions (Addendum)
Medication Instructions:  Your physician recommends that you continue on your current medications as directed. Please refer to the Current Medication list given to you today.  *If you need a refill on your cardiac medications before your next appointment, please call your pharmacy*   Lab Work:none ordered  If you have labs (blood work) drawn today and your tests are completely normal, you will receive your results only by: Marland Kitchen MyChart Message (if you have MyChart) OR . A paper copy in the mail If you have any lab test that is abnormal or we need to change your treatment, we will call you to review the results.   Testing/Procedures:none ordered    Follow-Up: At [facility], you and your health needs are our priority.  As part of our continuing mission to provide you with exceptional heart care, we have created designated Provider Care Teams.  These Care Teams include your primary Cardiologist (physician) and Advanced Practice Providers (APPs -  Physician Assistants and Nurse Practitioners) who all work together to provide you with the care you need, when you need it.  We recommend signing up for the patient portal called "MyChart".  Sign up information is provided on this After Visit Summary.  MyChart is used to connect with patients for Virtual Visits (Telemedicine).  Patients are able to view lab/test results, encounter notes, upcoming appointments, etc.  Non-urgent messages can be sent to your provider as well.   To learn more about what you can do with MyChart, go to [email or web address].    Your next appointment:  Keep your Nov appt

## 2020-02-24 NOTE — Progress Notes (Signed)
Office Visit    Patient Name: [name] Date of Encounter: 02/24/2020  Primary Care Provider:  Leone Haven, MD Primary Cardiologist:  Ida Rogue, MD Electrophysiologist:  None   Chief Complaint    [name] is a 74 y.o. male with a hx of HLD, COPD, TIA, GERD, depression, PTSD, SSS s/p PPM, OSA on CPAP, CAD s/p CABG, CLL, atrial fibrillation on Eliquis, diastolic CHF presents today for hospital follow up   Past Medical History    Past Medical History:  Diagnosis Date  . Anxiety   . Arthritis   . Atrial fibrillation ([place])    a. Dx 2013, recurred 02/2014, CHA2DS2VASc = 3 -->placed on Eliquis;  b. 02/2014 Echo: EF 50-55%, mid and apical anterior septum and mid and apical inf septum are abnl, mild to mod Ao sclerosis w/o AS.  Marland Kitchen Cancer associated pain   . Chicken pox   . Chronic lymphocytic leukemia ([place])    a. Dx 02/2014.  Marland Kitchen CLL (chronic lymphocytic leukemia) ([place])   . Complication of anesthesia    History of  PTSD--do not touch patient when waking up from surgery.  Marland Kitchen COPD (chronic obstructive pulmonary disease) ([place])   . Coronary artery disease    a. 04/2009 CABG x 3 (LIMA->LAD, VG->OM1, VG->PDA);  b. 09/2009 Cath: occluded VG x 2 w/ patent LIMA and L->R collats. EF 55%, mild antlat HK;  c. 10/2011 MV: EF 53%, no isch/infarct-->low risk.  Marland Kitchen Dysrhythmia    hx of a-fib  . GERD (gastroesophageal reflux disease)    occasional  . History of chemotherapy 2015-2016  . HOH (hard of hearing)    Bilateral Hearing Aids  . Hypertension   . Myocardial infarction ([place]) 2010  . OSA on CPAP    USE C-PAP  . Presence of permanent cardiac pacemaker 2017  . PTSD (post-traumatic stress disorder)   . PTSD (post-traumatic stress disorder)   . Pure hypercholesterolemia   . Rheumatic fever 1959  . 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 TIA (transient ischemic attack) 11/02/2015   Past Surgical History:  Procedure Laterality Date  . ABDOMINAL HERNIA REPAIR      . APPENDECTOMY  06/21/1985  . CARDIAC CATHETERIZATION  2010; 2011   ; Dr Fletcher Anon  . CORONARY ARTERY BYPASS GRAFT  04/2009   "CABG X3"  . EP IMPLANTABLE DEVICE N/A 03/03/2016   Procedure: Pacemaker Implant;  Surgeon: Deboraha Sprang, MD;  Location: [facility];  Service: Cardiovascular;  Laterality: N/A;  . FOREIGN BODY REMOVAL  1968   "shrapnel in my tailbone"  . INGUINAL HERNIA REPAIR Right   . INSERT / REPLACE / REMOVE PACEMAKER    . JOINT REPLACEMENT Right 2018  . LAPAROSCOPIC CHOLECYSTECTOMY    . TONSILLECTOMY AND ADENOIDECTOMY  1956  . TOTAL HIP ARTHROPLASTY Right 10/22/2016   Procedure: TOTAL HIP ARTHROPLASTY;  Surgeon: Dereck Leep, MD;  Location: [facility];  Service: Orthopedics;  Laterality: Right;  . TOTAL HIP ARTHROPLASTY Left 11/04/2017   Procedure: TOTAL HIP ARTHROPLASTY;  Surgeon: Dereck Leep, MD;  Location: [facility];  Service: Orthopedics;  Laterality: Left;    Allergies  No Known Allergies  History of Present Illness    [name] is a 74 y.o. male with a hx of HLD, COPD, TIA, GERD, depression, PTSD, SSS s/p PPM, OSA on CPAP, CAD s/p CABG, CLL, atrial fibrillation on Eliquis, diastolic CHF last seen while hospitalized.  Admitted 02/03/2020-02/04/2020 with  chest pain.  Troponins were negative.  Presentation is not consistent with cardiac origin and was likely musculoskeletal versus GI.  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  Ectatic abdominal aorta at risk for aneurysm development.  Recommended for repeat ultrasound in 5 years.  He was also recommended for improved BP and lipid control.  Echocardiogram 02/04/20 EF 55 to 60%, no RWMA, mild LVH, grade 1 diastolic dysfunction, RV normal size and function, mildly elevated PASP with RV systolic pressure 19.6 mmHg, mild MR, mild to moderate aortic valve sclerosis without stenosis.  He was recommended to increase his metoprolol to 50  mg.  Reports feeling well since discharge.  Tells me his chest was sore for a few days but then resolved and he has had no recurrent chest pain.  BP at home 98/48, 128/60, 148/90. Mostly in the 222L for systolic reading. Reports no shortness of breath nor dyspnea on exertion. Reports no chest pain, pressure, or tightness. No edema, orthopnea, PND. Reports no palpitations.   He stays very active working around the home, doing Market [employer], amongst other hobbies.   EKGs/Labs/Other Studies Reviewed:   The following studies were reviewed today: CTA 7/17/ IMPRESSION: --No evidence of thoracoabdominal aortic dissection or thoracic aortic aneurysm. 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 Ectatic abdominal aorta at risk for aneurysm development. Recommend followup by ultrasound in 5 years. This recommendation follows ACR consensus guidelines: White Paper of the ACR Incidental Findings Committee II on Vascular Findings. J Am Coll Radiol 2013; 10:789-794. --Aortic aneurysm NOS (ICD10-I71.9) --Marked interval improvement in retroperitoneal adenopathy in keeping with treated disease. No residual pathologic adenopathy identified. --Aortic Atherosclerosis (ICD10-I70.0).  Echocardiogram 02/09/2020 1. Left ventricular ejection fraction, by estimation, is 55 to 60%. The  left ventricle has normal function. The left ventricle has no regional  wall motion abnormalities. There is mild left ventricular hypertrophy.  Left ventricular diastolic parameters  are consistent with Grade I diastolic dysfunction (impaired relaxation).   2. Right ventricular systolic function is normal. The right ventricular  size is normal. There is mildly elevated pulmonary artery systolic  pressure. The estimated right ventricular systolic pressure is 79.8 mmHg.   3. The mitral valve is normal in structure. Mild mitral valve  regurgitation.   4. Mild to  moderate aortic valve sclerosis/calcification is present,  without any evidence of aortic stenosis.   EKG:  EKG is ordered today.  The ekg ordered today demonstrates atrial paced rhythm 67 bpm with no acute ST/T changes.  Recent Labs: 03/29/2019: TSH 0.677 08/04/2019: Magnesium 2.1 02/03/2020: B Natriuretic Peptide 111.1 02/23/2020: ALT 21; BUN 20; Creatinine, Ser 1.05; Hemoglobin 11.7; Platelets 149; Potassium 3.9; Sodium 141  Recent Lipid Panel    Component Value Date/Time   CHOL 111 02/04/2020 0451   CHOL 113 05/18/2017 1048   CHOL 125 03/08/2014 0451   TRIG 95 02/04/2020 0451   TRIG 119 03/08/2014 0451   TRIG 95 08/01/2009 0000   HDL 36 (L) 02/04/2020 0451   HDL 47 05/18/2017 1048   HDL 31 (L) 03/08/2014 0451   CHOLHDL 3.1 02/04/2020 0451   VLDL 19 02/04/2020 0451   VLDL 24 03/08/2014 0451   LDLCALC 56 02/04/2020 0451   LDLCALC 53 05/18/2017 1048   LDLCALC 70 03/08/2014 0451    Home Medications   Current Meds  Medication Sig  . acetaminophen (TYLENOL) 500 MG tablet Take 1,000 mg by mouth every 8 (  eight) hours as needed for mild pain.  Marland Kitchen acyclovir (ZOVIRAX) 400 MG tablet Take 1 tablet (400 mg total) by mouth 2 (two) times daily.  Marland Kitchen albuterol (PROVENTIL HFA;VENTOLIN HFA) 108 (90 Base) MCG/ACT inhaler Inhale 2 puffs into the lungs every 6 (six) hours as needed for wheezing or shortness of breath.  Marland Kitchen amiodarone (PACERONE) 200 MG tablet Take 1/2 (one-half) tablet by mouth twice daily  . apixaban (ELIQUIS) 5 MG TABS tablet Take 5 mg by mouth 2 (two) times daily.  Marland Kitchen atorvastatin (LIPITOR) 40 MG tablet Take 40 mg by mouth daily. Start back slowly with 20 mg working up on slow titration to 40mg  per Dr Rockey Situ.  . budesonide-formoterol (SYMBICORT) 160-4.5 MCG/ACT inhaler Inhale 2 puffs into the lungs 2 (two) times daily.  . cetirizine (ZYRTEC) 10 MG tablet Take 10 mg by mouth daily as needed for allergies.   . Coenzyme Q10 (COQ10) 200 MG CAPS Take 200 mg by mouth daily.  Marland Kitchen ezetimibe  (ZETIA) 10 MG tablet Take 1 tablet (10 mg total) by mouth daily.  . furosemide (LASIX) 20 MG tablet Take 1 tablet (20 mg) by mouth twice daily as needed   . Krill Oil 350 MG CAPS Take 350 mg by mouth as needed.   . metoprolol tartrate (LOPRESSOR) 50 MG tablet Take 1 tablet (50 mg total) by mouth 2 (two) times daily.  . mirtazapine (REMERON) 15 MG tablet Take 15 mg by mouth at bedtime as needed (for panic associated with PTSD).   . Multiple Vitamin (MULTIVITAMIN WITH MINERALS) TABS tablet Take 1 tablet by mouth daily.  . nitroGLYCERIN (NITROSTAT) 0.4 MG SL tablet Place 1 tablet (0.4 mg total) under the tongue every 5 (five) minutes as needed for chest pain.  . traMADol (ULTRAM) 50 MG tablet Take 1 tablet (50 mg total) by mouth every 12 (twelve) hours as needed.  . venetoclax (VENCLEXTA) 10 MG TABS Take 1 tablet by mouth daily.      Review of Systems   Review of Systems  Constitutional: Negative for chills, fever and malaise/fatigue.  Cardiovascular: Negative for chest pain, dyspnea on exertion, irregular heartbeat, leg swelling, near-syncope, orthopnea, palpitations and syncope.  Respiratory: Negative for cough, shortness of breath and wheezing.   Gastrointestinal: Negative for melena, nausea and vomiting.  Genitourinary: Negative for hematuria.  Neurological: Negative for dizziness, light-headedness and weakness.   All other systems reviewed and are otherwise negative except as noted above.  Physical Exam    VS:  BP 120/90 (BP Location: Left Arm, Patient Position: Sitting, Cuff Size: Normal)   Pulse 67   Ht 5\' 8"  (1.727 m)   Wt 217 lb 2 oz (98.5 kg)   SpO2 97%   BMI 33.01 kg/m  , BMI Body mass index is 33.01 kg/m. GEN: Well nourished, well developed, in no acute distress. HEENT: normal. Neck: Supple, no JVD, carotid bruits, or masses. Cardiac: RRR, no murmurs, rubs, or gallops. No clubbing, cyanosis, edema.  Radials/DP/PT 2+ and equal bilaterally.  Respiratory:  Respirations  regular and unlabored, clear to auscultation bilaterally. GI: Soft, nontender, nondistended, BS + x 4. MS: No deformity or atrophy. Skin: Warm and dry, no rash. Neuro:  Strength and sensation are intact. Psych: Normal affect.  Assessment & Plan    1. Atypical chest pain -recent admission with chest pain atypical for angina.  Serial troponins were negative.  Echo with normal LVEF, grade 1 diastolic dysfunction, no regional wall motion abnormalities.  No recurrent chest pain, pressure, tightness.  No  indication for ischemic evaluation.  Likely musculoskeletal.  2. Penetrating atherosclerotic ulcer, infrarenal abdominal aorta, mild dilation of infrarenal abdominal aorta at risk for aneurysm development by CT 01/2020- No abdominal pain no chest pain.  BP well controlled.  Most recent LDL less than 70.  Continue cholesterol and blood pressure management.   3. CAD s/p CABG - Stable with no anginal symptoms. No indication for ischemic evaluation. GDMT includes BB, statin,   4. HLD, LDL less than 70 - 02/04/20 LDL 56.  Continue atorvastatin 40 mg daily.  5. HTN - BP well controlled. Continue current antihypertensive regimen.  His metoprolol was increased to 50 mg twice daily in the hospital and we will provide refill today.  6.  PAF/chronic anticoagulation -denies bleeding complications on Eliquis.  Reports no palpitations.  Continue amiodarone 100 mg twice daily, metoprolol 50 mg daily.  7. CLL - Continue to follow with oncology    8. Chronic diastolic heart failure  -Echo 02/04/2020 with EF 55 to 41%, grade 1 diastolic dysfunction, mildly elevated PASP.  Euvolemic and well compensated on exam.  Continue Lasix 20 mg daily.  9. History of TIA - Continue statin.  No aspirin secondary to chronic anticoagulation.  10. OSA  -CPAP compliance encouraged .  Disposition: Follow up In November as previously scheduled with Dr. Wallie Renshaw, NP 02/24/2020, 8:30 AM

## 2020-03-09 ENCOUNTER — Ambulatory Visit (INDEPENDENT_AMBULATORY_CARE_PROVIDER_SITE_OTHER): Payer: Medicare HMO | Admitting: *Deleted

## 2020-03-09 DIAGNOSIS — I495 Sick sinus syndrome: Secondary | ICD-10-CM

## 2020-03-09 LAB — CUP PACEART REMOTE DEVICE CHECK
Battery Remaining Longevity: 65 mo
Battery Voltage: 3 V
Brady Statistic AP VP Percent: 0.3 %
Brady Statistic AP VS Percent: 98.76 %
Brady Statistic AS VP Percent: 0 %
Brady Statistic AS VS Percent: 0.94 %
Brady Statistic RA Percent Paced: 99.05 %
Brady Statistic RV Percent Paced: 0.32 %
Date Time Interrogation Session: 20210820125524
Implantable Lead Implant Date: 20170814
Implantable Lead Implant Date: 20170814
Implantable Lead Location: 753859
Implantable Lead Location: 753860
Implantable Lead Model: 5076
Implantable Lead Model: 5076
Implantable Pulse Generator Implant Date: 20170814
Lead Channel Impedance Value: 342 Ohm
Lead Channel Impedance Value: 437 Ohm
Lead Channel Impedance Value: 494 Ohm
Lead Channel Impedance Value: 570 Ohm
Lead Channel Pacing Threshold Amplitude: 0.625 V
Lead Channel Pacing Threshold Amplitude: 1 V
Lead Channel Pacing Threshold Pulse Width: 0.4 ms
Lead Channel Pacing Threshold Pulse Width: 0.4 ms
Lead Channel Sensing Intrinsic Amplitude: 22.25 mV
Lead Channel Sensing Intrinsic Amplitude: 22.25 mV
Lead Channel Sensing Intrinsic Amplitude: 3.625 mV
Lead Channel Sensing Intrinsic Amplitude: 3.625 mV
Lead Channel Setting Pacing Amplitude: 2 V
Lead Channel Setting Pacing Amplitude: 2.5 V
Lead Channel Setting Pacing Pulse Width: 0.4 ms
Lead Channel Setting Sensing Sensitivity: 0.9 mV

## 2020-03-12 ENCOUNTER — Other Ambulatory Visit: Payer: Self-pay

## 2020-03-12 ENCOUNTER — Telehealth: Payer: Self-pay | Admitting: Family Medicine

## 2020-03-12 ENCOUNTER — Telehealth: Payer: Self-pay | Admitting: *Deleted

## 2020-03-12 ENCOUNTER — Telehealth: Payer: Self-pay | Admitting: Internal Medicine

## 2020-03-12 ENCOUNTER — Ambulatory Visit
Admission: RE | Admit: 2020-03-12 | Discharge: 2020-03-12 | Disposition: A | Discharge status: Home / Self Care | Payer: Medicare HMO | Source: Ambulatory Visit | Attending: Internal Medicine | Admitting: Internal Medicine

## 2020-03-12 ENCOUNTER — Ambulatory Visit
Admission: RE | Admit: 2020-03-12 | Discharge: 2020-03-12 | Disposition: A | Discharge status: Home / Self Care | Payer: Medicare HMO | Attending: Internal Medicine | Admitting: Internal Medicine

## 2020-03-12 DIAGNOSIS — J069 Acute upper respiratory infection, unspecified: Secondary | ICD-10-CM | POA: Insufficient documentation

## 2020-03-12 DIAGNOSIS — R0602 Shortness of breath: Secondary | ICD-10-CM | POA: Insufficient documentation

## 2020-03-12 DIAGNOSIS — C9110 Chronic lymphocytic leukemia of B-cell type not having achieved remission: Secondary | ICD-10-CM

## 2020-03-12 DIAGNOSIS — R059 Cough, unspecified: Secondary | ICD-10-CM

## 2020-03-12 DIAGNOSIS — R05 Cough: Secondary | ICD-10-CM | POA: Insufficient documentation

## 2020-03-12 IMAGING — CR DG CHEST 2V
1 series · 2 of 2 positions shown · non-contrast
Comparison: [DATE]

CLINICAL DATA: Cough, shortness of breath

EXAM:
CHEST - 2 VIEW

[Series 1: dg chest 2 view · 0.14mm/px · 2 of 2 slices shown]
[im 1/2]
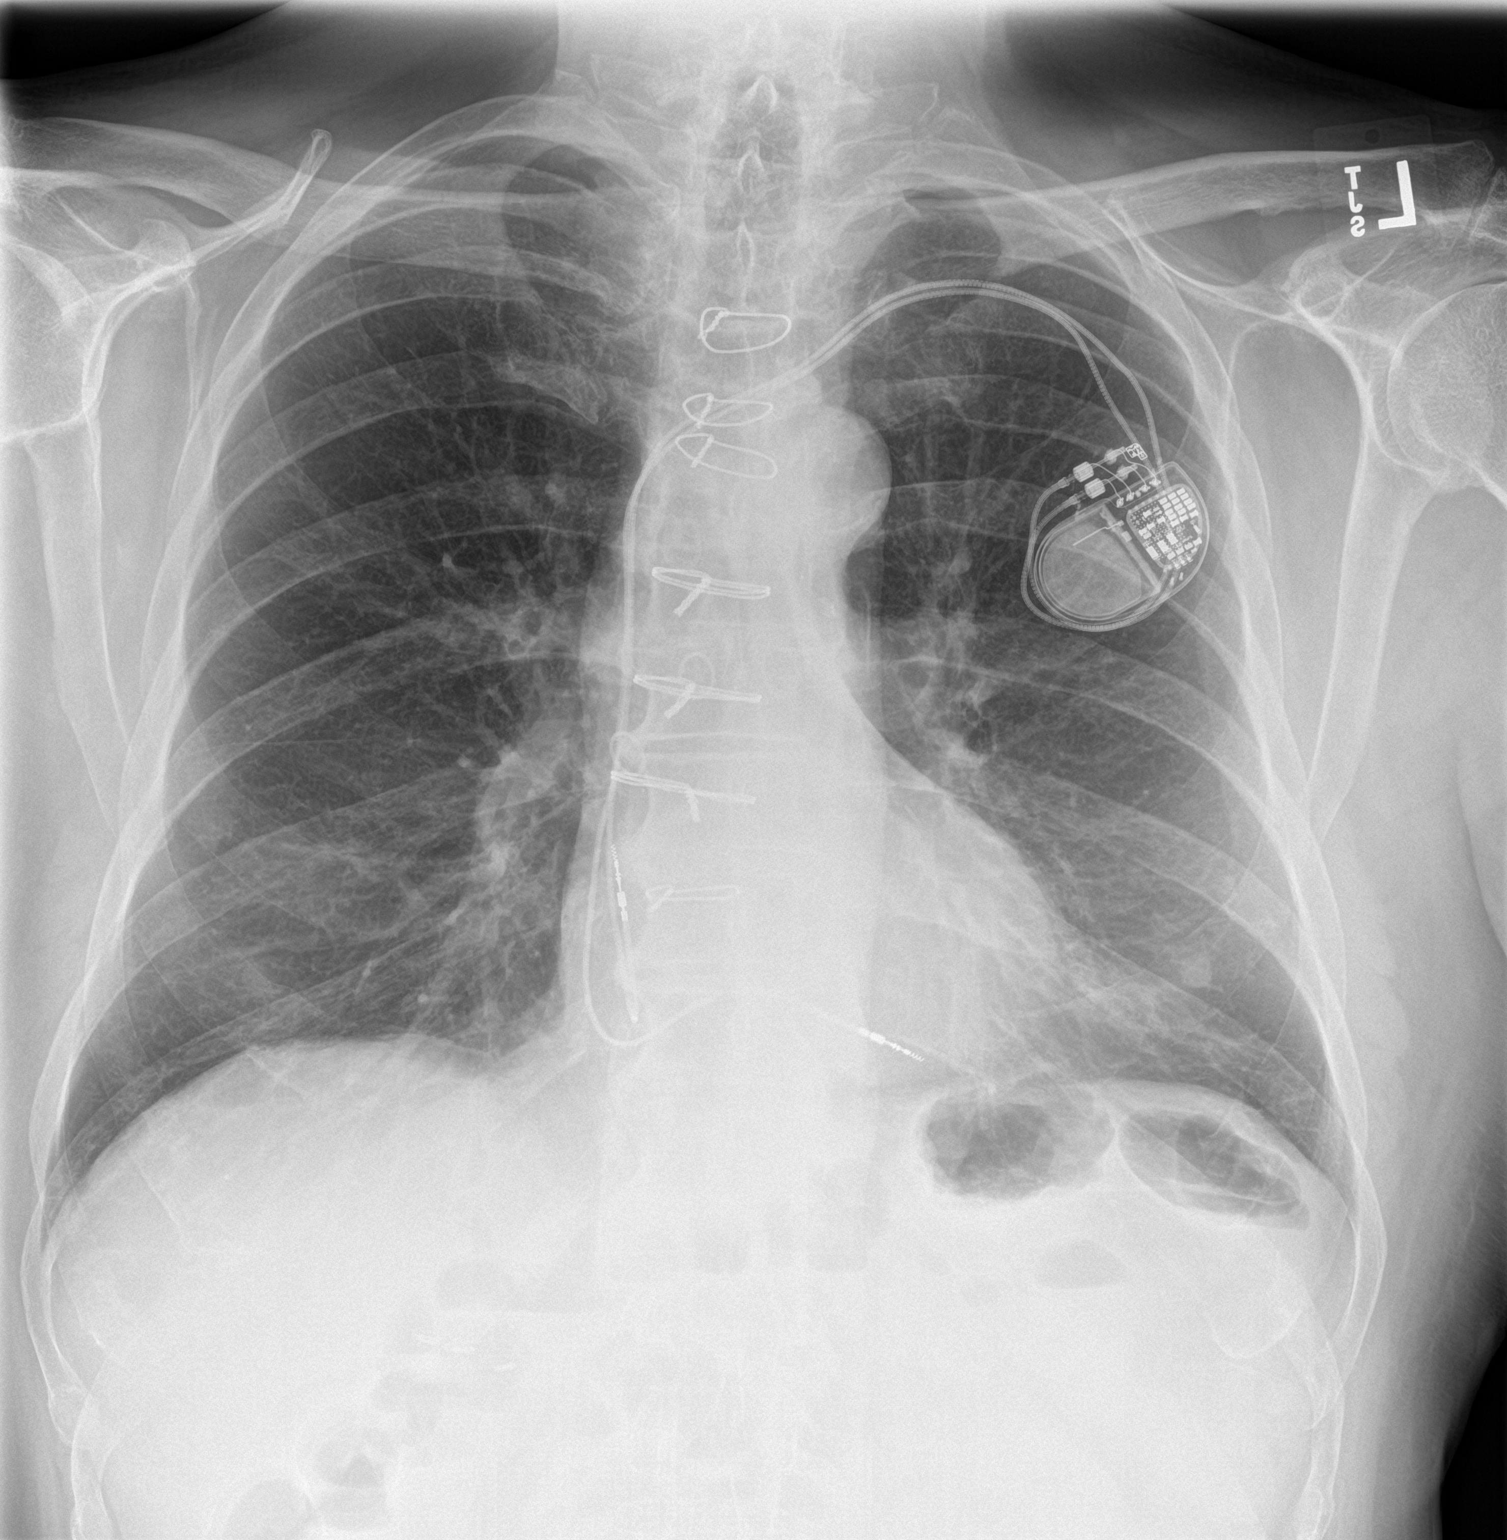
[im 2/2]
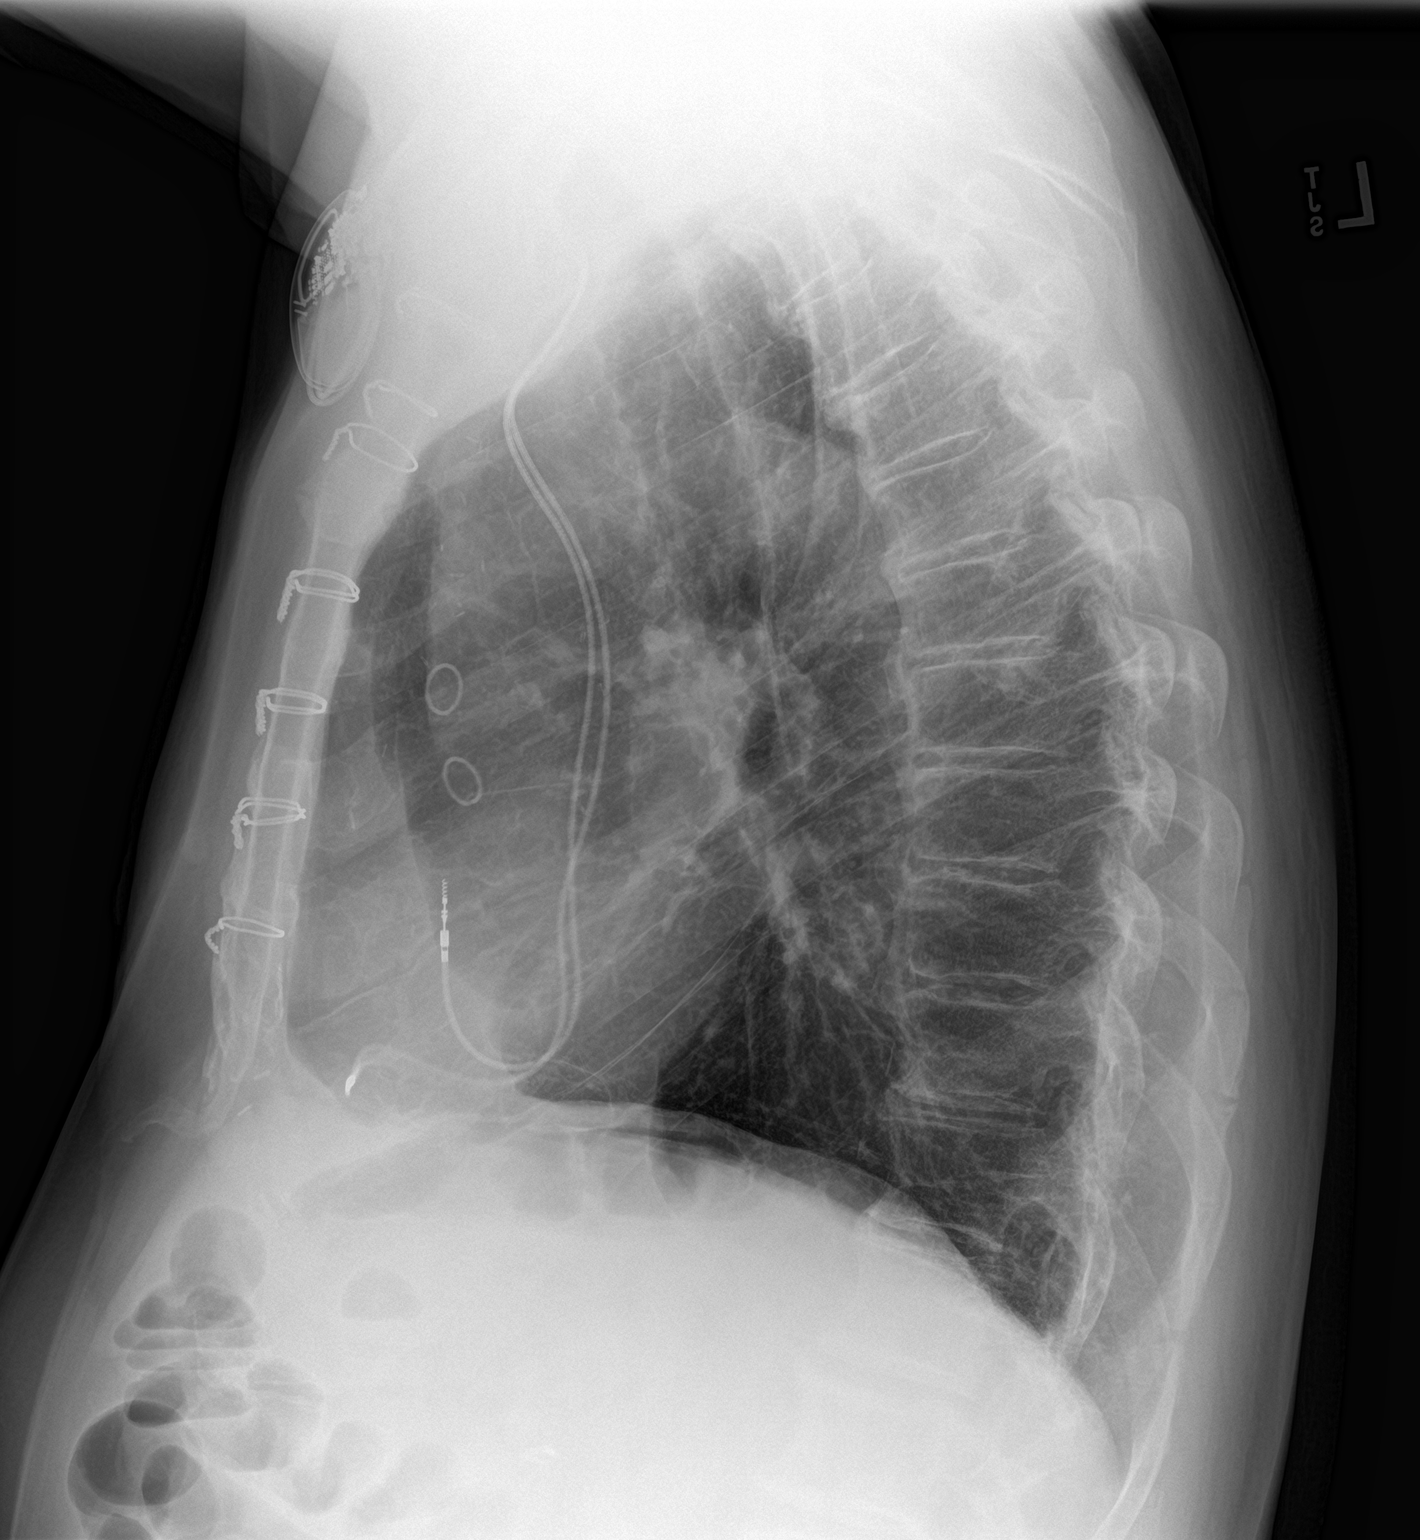

[2 of 2 positions shown; findings below may reference images not displayed]

FINDINGS: Left pacer remains in place, unchanged. Prior CABG. Heart is normal
size. Nodular density projects over the left lung base and is stable
since prior study, felt represent nipple shadow. No confluent
opacities or effusions. No acute bony abnormality.
IMPRESSION: No active cardiopulmonary disease.

## 2020-03-12 MED ORDER — AZITHROMYCIN 250 MG PO TABS: ORAL_TABLET | ORAL | 0 refills | Status: DC

## 2020-03-12 MED FILL — VENCLEXTA 100 MG TABS: 100 | 30 days supply | Qty: 30 | Fill #4

## 2020-03-12 NOTE — Telephone Encounter (Signed)
Pt has chest congestion, SOB, and "flu-like symptoms". Pt is going to get tested for Covid today and got an appt scheduled with Dr Caryl Bis for later in the week. He would like a call back about what he can take.

## 2020-03-12 NOTE — Progress Notes (Signed)
Remote pacemaker transmission.   

## 2020-03-12 NOTE — Telephone Encounter (Signed)
Spoke to patient regarding his concerns-cough shortness of breath no fever.  Awaiting Covid testing; recommend chest x-ray ASAP; recommend Z-Pak.  Hold off venetoclax on further instructions.  Recommend getting pulse oximetry-to call us if oxygen levels less than 90.  Await chest x-ray.

## 2020-03-12 NOTE — Telephone Encounter (Signed)
Returned patient's phone call. Patient c/o Cough up yellow sputum. Denies any fevers. Patient reports severe fatigue. Croup and wheezing. Pt states that he has not been anywhere but feels "horrible." Patient is scheduled at 4pm at [facility] for COVID testing.   Spoke with Dr. Rogue Bussing -Patient needs to proceed with chest xray today. Hold venaclax and call in zpac (250 mg Take 2 tablets on day 1 and one tablet days 2-5 day)  Script sent to [facility] in [place]. Patient aware to hold venaclax until further notice.

## 2020-03-12 NOTE — Telephone Encounter (Signed)
Patient called reporting that he has congestion and is shortness of breath. He states that he called his PCP, but does not know if they are going to do anything. He also reports that he is going to have a OVID test this afternoon to see if he is positive and is asking that he be called back to discuss this. [phone]

## 2020-03-13 ENCOUNTER — Telehealth: Payer: Self-pay | Admitting: Internal Medicine

## 2020-03-13 ENCOUNTER — Telehealth: Payer: Self-pay | Admitting: *Deleted

## 2020-03-13 MED ORDER — HYDROCOD POLST-CPM POLST ER 10-8 MG/5ML PO SUER
5.0000 mL | Freq: Every evening | ORAL | 0 refills | Status: DC | PRN
Start: 2020-03-13 — End: 2020-03-22

## 2020-03-13 MED ORDER — PREDNISONE 20 MG PO TABS
ORAL_TABLET | ORAL | 0 refills | Status: DC
Start: 2020-03-13 — End: 2020-03-18

## 2020-03-13 NOTE — Telephone Encounter (Signed)
Spoke to pt re: his ongoing sypmtoms of cough or dyspnea. CXR- clear; reocmmend updating [place] er: COVID results.   Plan predinisone/tussinex.

## 2020-03-13 NOTE — Telephone Encounter (Signed)
Noted. He needs to quarantine at home at least until his COVID test comes back.

## 2020-03-13 NOTE — Telephone Encounter (Signed)
Please triage him. With his shortness of breath he may need to be seen in person at an urgent care prior to a virtual visit later this week.

## 2020-03-13 NOTE — Telephone Encounter (Signed)
x

## 2020-03-13 NOTE — Telephone Encounter (Signed)
Called patient to ask about his flu like symptoms. [name] states that he is fine and does not need to go to the urgent care or ER. He states he is feeling fine but his chest still hurts when he coughs up any phlegm. He contacted his cancer doctor and was given a Zpack to complete and is currently taking the medicine. [name] states that he is in no discomfort and will be fine until his video visit with Denice Paradise on 03/16/20. I advised him to seek care at an Urgent care or the ER for same day if any of his symptoms get worse or if he needs immediate care.

## 2020-03-13 NOTE — Telephone Encounter (Signed)
Contacted patient to follow-up on his symptoms. Patient reports that oxygen sats - 95 - 96 % RA. Patient's covid testing results are still pending. Patient reports that his symptoms are "still the same." no worsening shortness of breath. No fevers. No lost of taste or smell.  He thanked me for calling and checking on him. He also is aware that his chest xray was normal.

## 2020-03-14 ENCOUNTER — Emergency Department: Payer: No Typology Code available for payment source

## 2020-03-14 ENCOUNTER — Telehealth: Payer: Self-pay | Admitting: *Deleted

## 2020-03-14 ENCOUNTER — Inpatient Hospital Stay: Payer: Medicare HMO | Admitting: Oncology

## 2020-03-14 ENCOUNTER — Telehealth: Payer: Self-pay | Admitting: Internal Medicine

## 2020-03-14 ENCOUNTER — Inpatient Hospital Stay
Admission: EM | Admit: 2020-03-14 | Discharge: 2020-03-18 | DRG: 190 | Disposition: A | Discharge status: Home / Self Care | Payer: No Typology Code available for payment source | Attending: Internal Medicine | Admitting: Internal Medicine

## 2020-03-14 ENCOUNTER — Other Ambulatory Visit: Payer: Self-pay

## 2020-03-14 ENCOUNTER — Encounter: Payer: Self-pay | Admitting: Emergency Medicine

## 2020-03-14 DIAGNOSIS — Z7901 Long term (current) use of anticoagulants: Secondary | ICD-10-CM

## 2020-03-14 DIAGNOSIS — K219 Gastro-esophageal reflux disease without esophagitis: Secondary | ICD-10-CM | POA: Diagnosis present

## 2020-03-14 DIAGNOSIS — I480 Paroxysmal atrial fibrillation: Secondary | ICD-10-CM | POA: Diagnosis present

## 2020-03-14 DIAGNOSIS — Z950 Presence of cardiac pacemaker: Secondary | ICD-10-CM | POA: Diagnosis not present

## 2020-03-14 DIAGNOSIS — T451X5A Adverse effect of antineoplastic and immunosuppressive drugs, initial encounter: Secondary | ICD-10-CM | POA: Diagnosis present

## 2020-03-14 DIAGNOSIS — Z8673 Personal history of transient ischemic attack (TIA), and cerebral infarction without residual deficits: Secondary | ICD-10-CM | POA: Diagnosis not present

## 2020-03-14 DIAGNOSIS — B338 Other specified viral diseases: Secondary | ICD-10-CM

## 2020-03-14 DIAGNOSIS — G459 Transient cerebral ischemic attack, unspecified: Secondary | ICD-10-CM | POA: Diagnosis present

## 2020-03-14 DIAGNOSIS — X58XXXA Exposure to other specified factors, initial encounter: Secondary | ICD-10-CM | POA: Diagnosis present

## 2020-03-14 DIAGNOSIS — J441 Chronic obstructive pulmonary disease with (acute) exacerbation: Principal | ICD-10-CM | POA: Diagnosis present

## 2020-03-14 DIAGNOSIS — Z8619 Personal history of other infectious and parasitic diseases: Secondary | ICD-10-CM

## 2020-03-14 DIAGNOSIS — R0602 Shortness of breath: Secondary | ICD-10-CM | POA: Diagnosis not present

## 2020-03-14 DIAGNOSIS — C9110 Chronic lymphocytic leukemia of B-cell type not having achieved remission: Secondary | ICD-10-CM | POA: Diagnosis present

## 2020-03-14 DIAGNOSIS — Z951 Presence of aortocoronary bypass graft: Secondary | ICD-10-CM | POA: Diagnosis not present

## 2020-03-14 DIAGNOSIS — D696 Thrombocytopenia, unspecified: Secondary | ICD-10-CM | POA: Diagnosis present

## 2020-03-14 DIAGNOSIS — E785 Hyperlipidemia, unspecified: Secondary | ICD-10-CM | POA: Diagnosis present

## 2020-03-14 DIAGNOSIS — B974 Respiratory syncytial virus as the cause of diseases classified elsewhere: Secondary | ICD-10-CM | POA: Diagnosis present

## 2020-03-14 DIAGNOSIS — F329 Major depressive disorder, single episode, unspecified: Secondary | ICD-10-CM | POA: Diagnosis present

## 2020-03-14 DIAGNOSIS — D72829 Elevated white blood cell count, unspecified: Secondary | ICD-10-CM | POA: Diagnosis not present

## 2020-03-14 DIAGNOSIS — T380X5A Adverse effect of glucocorticoids and synthetic analogues, initial encounter: Secondary | ICD-10-CM | POA: Diagnosis not present

## 2020-03-14 DIAGNOSIS — F4310 Post-traumatic stress disorder, unspecified: Secondary | ICD-10-CM | POA: Diagnosis present

## 2020-03-14 DIAGNOSIS — I10 Essential (primary) hypertension: Secondary | ICD-10-CM | POA: Diagnosis present

## 2020-03-14 DIAGNOSIS — H9190 Unspecified hearing loss, unspecified ear: Secondary | ICD-10-CM | POA: Diagnosis present

## 2020-03-14 DIAGNOSIS — I5032 Chronic diastolic (congestive) heart failure: Secondary | ICD-10-CM | POA: Diagnosis present

## 2020-03-14 DIAGNOSIS — I2510 Atherosclerotic heart disease of native coronary artery without angina pectoris: Secondary | ICD-10-CM | POA: Diagnosis present

## 2020-03-14 DIAGNOSIS — Z79899 Other long term (current) drug therapy: Secondary | ICD-10-CM

## 2020-03-14 DIAGNOSIS — I739 Peripheral vascular disease, unspecified: Secondary | ICD-10-CM | POA: Diagnosis present

## 2020-03-14 DIAGNOSIS — Z9049 Acquired absence of other specified parts of digestive tract: Secondary | ICD-10-CM

## 2020-03-14 DIAGNOSIS — G4733 Obstructive sleep apnea (adult) (pediatric): Secondary | ICD-10-CM

## 2020-03-14 DIAGNOSIS — Z7951 Long term (current) use of inhaled steroids: Secondary | ICD-10-CM

## 2020-03-14 DIAGNOSIS — Z87891 Personal history of nicotine dependence: Secondary | ICD-10-CM

## 2020-03-14 DIAGNOSIS — E7800 Pure hypercholesterolemia, unspecified: Secondary | ICD-10-CM | POA: Diagnosis present

## 2020-03-14 DIAGNOSIS — I252 Old myocardial infarction: Secondary | ICD-10-CM

## 2020-03-14 DIAGNOSIS — Z20822 Contact with and (suspected) exposure to covid-19: Secondary | ICD-10-CM | POA: Diagnosis present

## 2020-03-14 DIAGNOSIS — J9601 Acute respiratory failure with hypoxia: Secondary | ICD-10-CM | POA: Diagnosis present

## 2020-03-14 DIAGNOSIS — Y92239 Unspecified place in hospital as the place of occurrence of the external cause: Secondary | ICD-10-CM | POA: Diagnosis not present

## 2020-03-14 DIAGNOSIS — D84821 Immunodeficiency due to drugs: Secondary | ICD-10-CM | POA: Diagnosis present

## 2020-03-14 DIAGNOSIS — Z8249 Family history of ischemic heart disease and other diseases of the circulatory system: Secondary | ICD-10-CM

## 2020-03-14 DIAGNOSIS — Z974 Presence of external hearing-aid: Secondary | ICD-10-CM | POA: Diagnosis not present

## 2020-03-14 DIAGNOSIS — I110 Hypertensive heart disease with heart failure: Secondary | ICD-10-CM | POA: Diagnosis present

## 2020-03-14 DIAGNOSIS — D7589 Other specified diseases of blood and blood-forming organs: Secondary | ICD-10-CM | POA: Diagnosis not present

## 2020-03-14 DIAGNOSIS — D630 Anemia in neoplastic disease: Secondary | ICD-10-CM | POA: Diagnosis present

## 2020-03-14 DIAGNOSIS — J449 Chronic obstructive pulmonary disease, unspecified: Secondary | ICD-10-CM | POA: Diagnosis present

## 2020-03-14 DIAGNOSIS — Z96643 Presence of artificial hip joint, bilateral: Secondary | ICD-10-CM | POA: Diagnosis present

## 2020-03-14 DIAGNOSIS — F32A Depression, unspecified: Secondary | ICD-10-CM | POA: Diagnosis present

## 2020-03-14 HISTORY — DX: Other specified viral diseases: B33.8

## 2020-03-14 LAB — TROPONIN I (HIGH SENSITIVITY): Troponin I (High Sensitivity): 8 ng/L (ref <18)

## 2020-03-14 LAB — HEPATIC FUNCTION PANEL
ALT: 34 U/L (ref 0–44)
AST: 29 U/L (ref 15–41)
Albumin: 4 g/dL (ref 3.5–5.0)
Alkaline Phosphatase: 117 U/L (ref 38–126)
Bilirubin, Direct: 0.3 mg/dL — ABNORMAL HIGH (ref 0.0–0.2)
Indirect Bilirubin: 1.4 mg/dL — ABNORMAL HIGH (ref 0.3–0.9)
Total Bilirubin: 1.7 mg/dL — ABNORMAL HIGH (ref 0.3–1.2)
Total Protein: 6.3 g/dL — ABNORMAL LOW (ref 6.5–8.1)

## 2020-03-14 LAB — CBC
HCT: 37 % — ABNORMAL LOW (ref 39.0–52.0)
Hemoglobin: 12.6 g/dL — ABNORMAL LOW (ref 13.0–17.0)
MCH: 34.1 pg — ABNORMAL HIGH (ref 26.0–34.0)
MCHC: 34.1 g/dL (ref 30.0–36.0)
MCV: 100.3 fL — ABNORMAL HIGH (ref 80.0–100.0)
Platelets: 121 10*3/uL — ABNORMAL LOW (ref 150–400)
RBC: 3.69 MIL/uL — ABNORMAL LOW (ref 4.22–5.81)
RDW: 14.2 % (ref 11.5–15.5)
WBC: 9 10*3/uL (ref 4.0–10.5)
nRBC: 0 % (ref 0.0–0.2)

## 2020-03-14 LAB — BASIC METABOLIC PANEL
Anion gap: 13 (ref 5–15)
BUN: 15 mg/dL (ref 8–23)
CO2: 22 mmol/L (ref 22–32)
Calcium: 8.6 mg/dL — ABNORMAL LOW (ref 8.9–10.3)
Chloride: 103 mmol/L (ref 98–111)
Creatinine, Ser: 0.99 mg/dL (ref 0.61–1.24)
GFR calc Af Amer: >60 mL/min (ref >60)
GFR calc non Af Amer: >60 mL/min (ref >60)
Glucose, Bld: 136 mg/dL — ABNORMAL HIGH (ref 70–99)
Potassium: 4 mmol/L (ref 3.5–5.1)
Sodium: 138 mmol/L (ref 135–145)

## 2020-03-14 LAB — SARS CORONAVIRUS 2 BY RT PCR (HOSPITAL ORDER, PERFORMED IN ~~LOC~~ HOSPITAL LAB): SARS Coronavirus 2: NEGATIVE

## 2020-03-14 LAB — BRAIN NATRIURETIC PEPTIDE: B Natriuretic Peptide: 93.9 pg/mL (ref 0.0–100.0)

## 2020-03-14 IMAGING — CR DG CHEST 2V
1 series · 2 of 2 positions shown · non-contrast
Comparison: [DATE].

CLINICAL DATA: Shortness of breath.

EXAM:
CHEST - 2 VIEW

[Series 1: dg chest 2 view · 0.14mm/px · 2 of 2 slices shown]
[im 1/2]
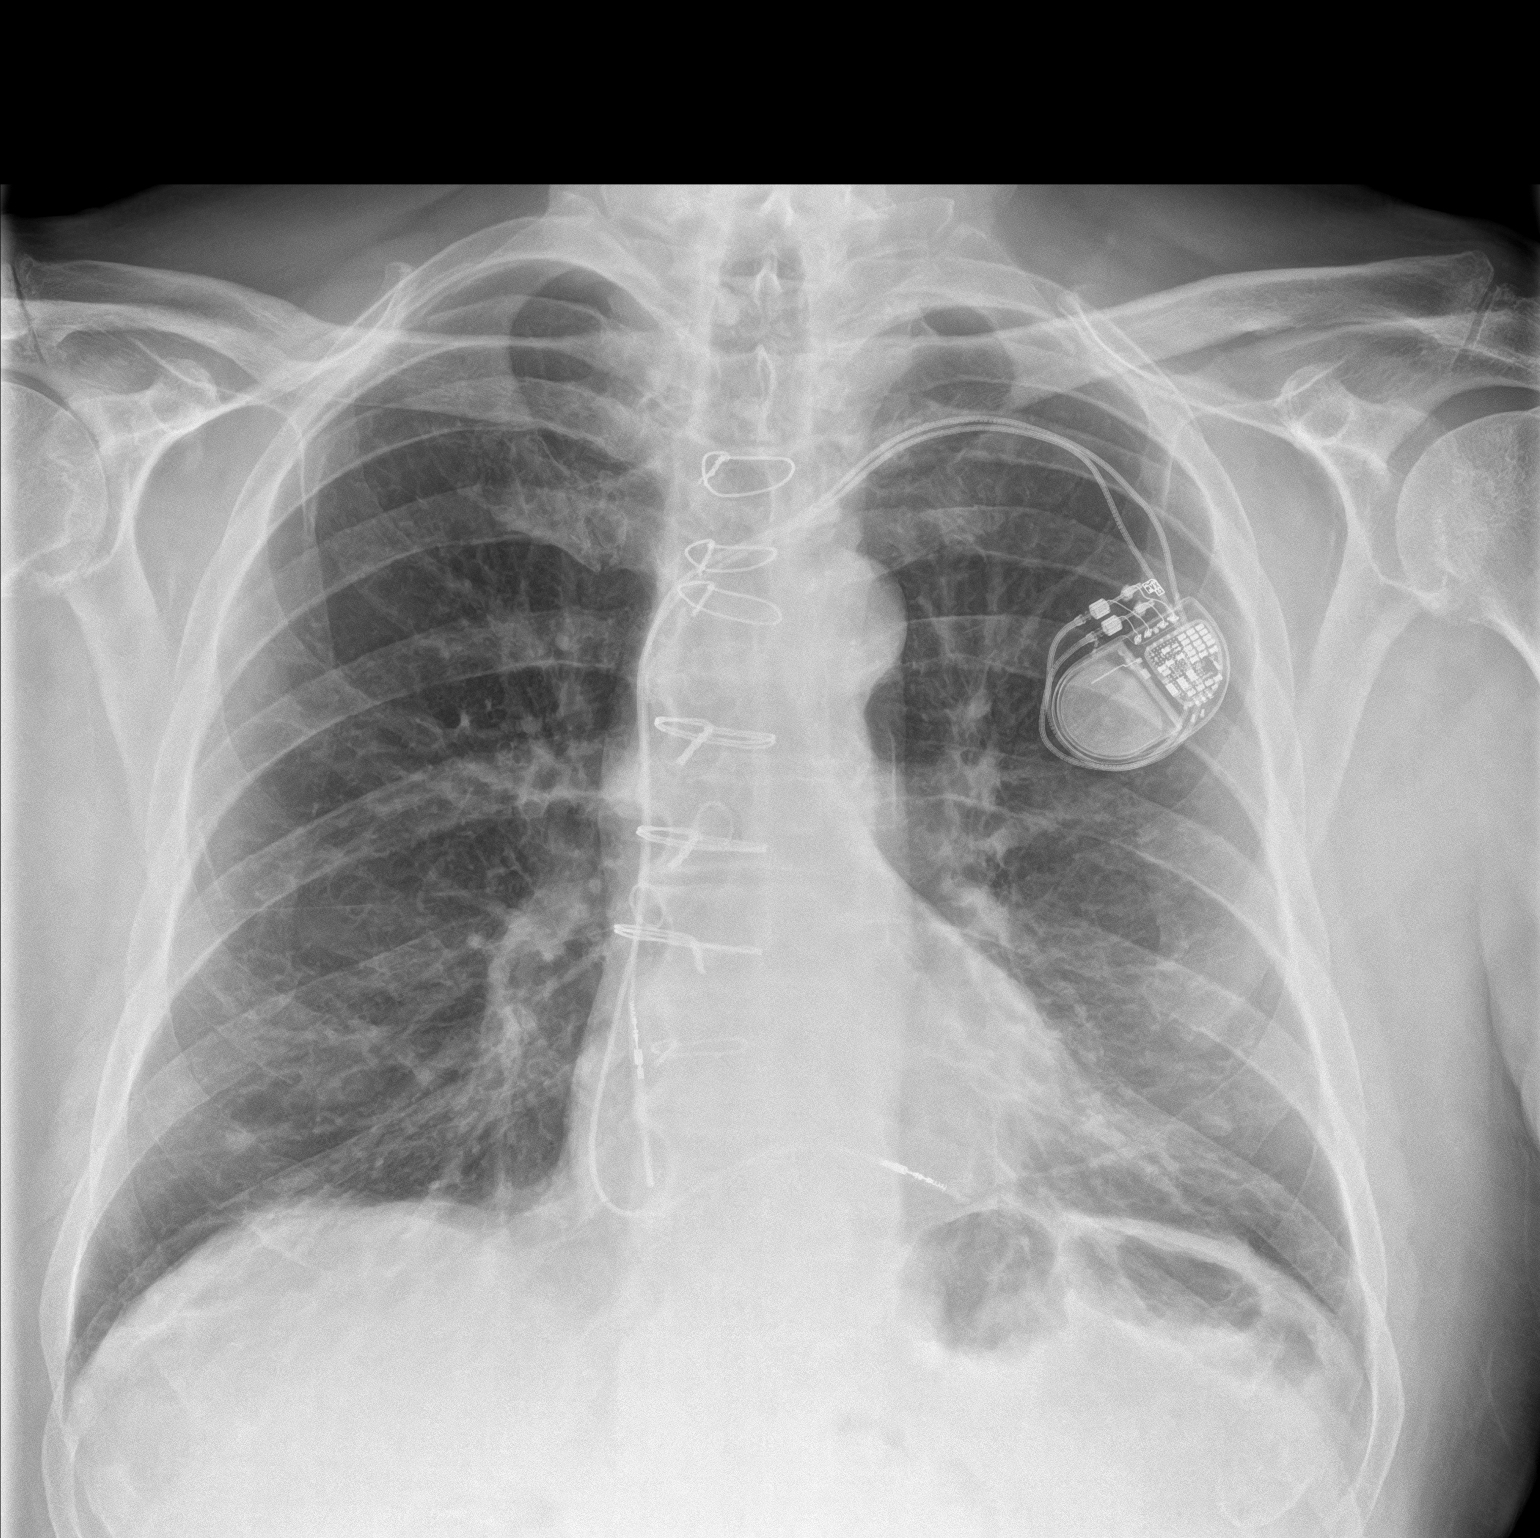
[im 2/2]
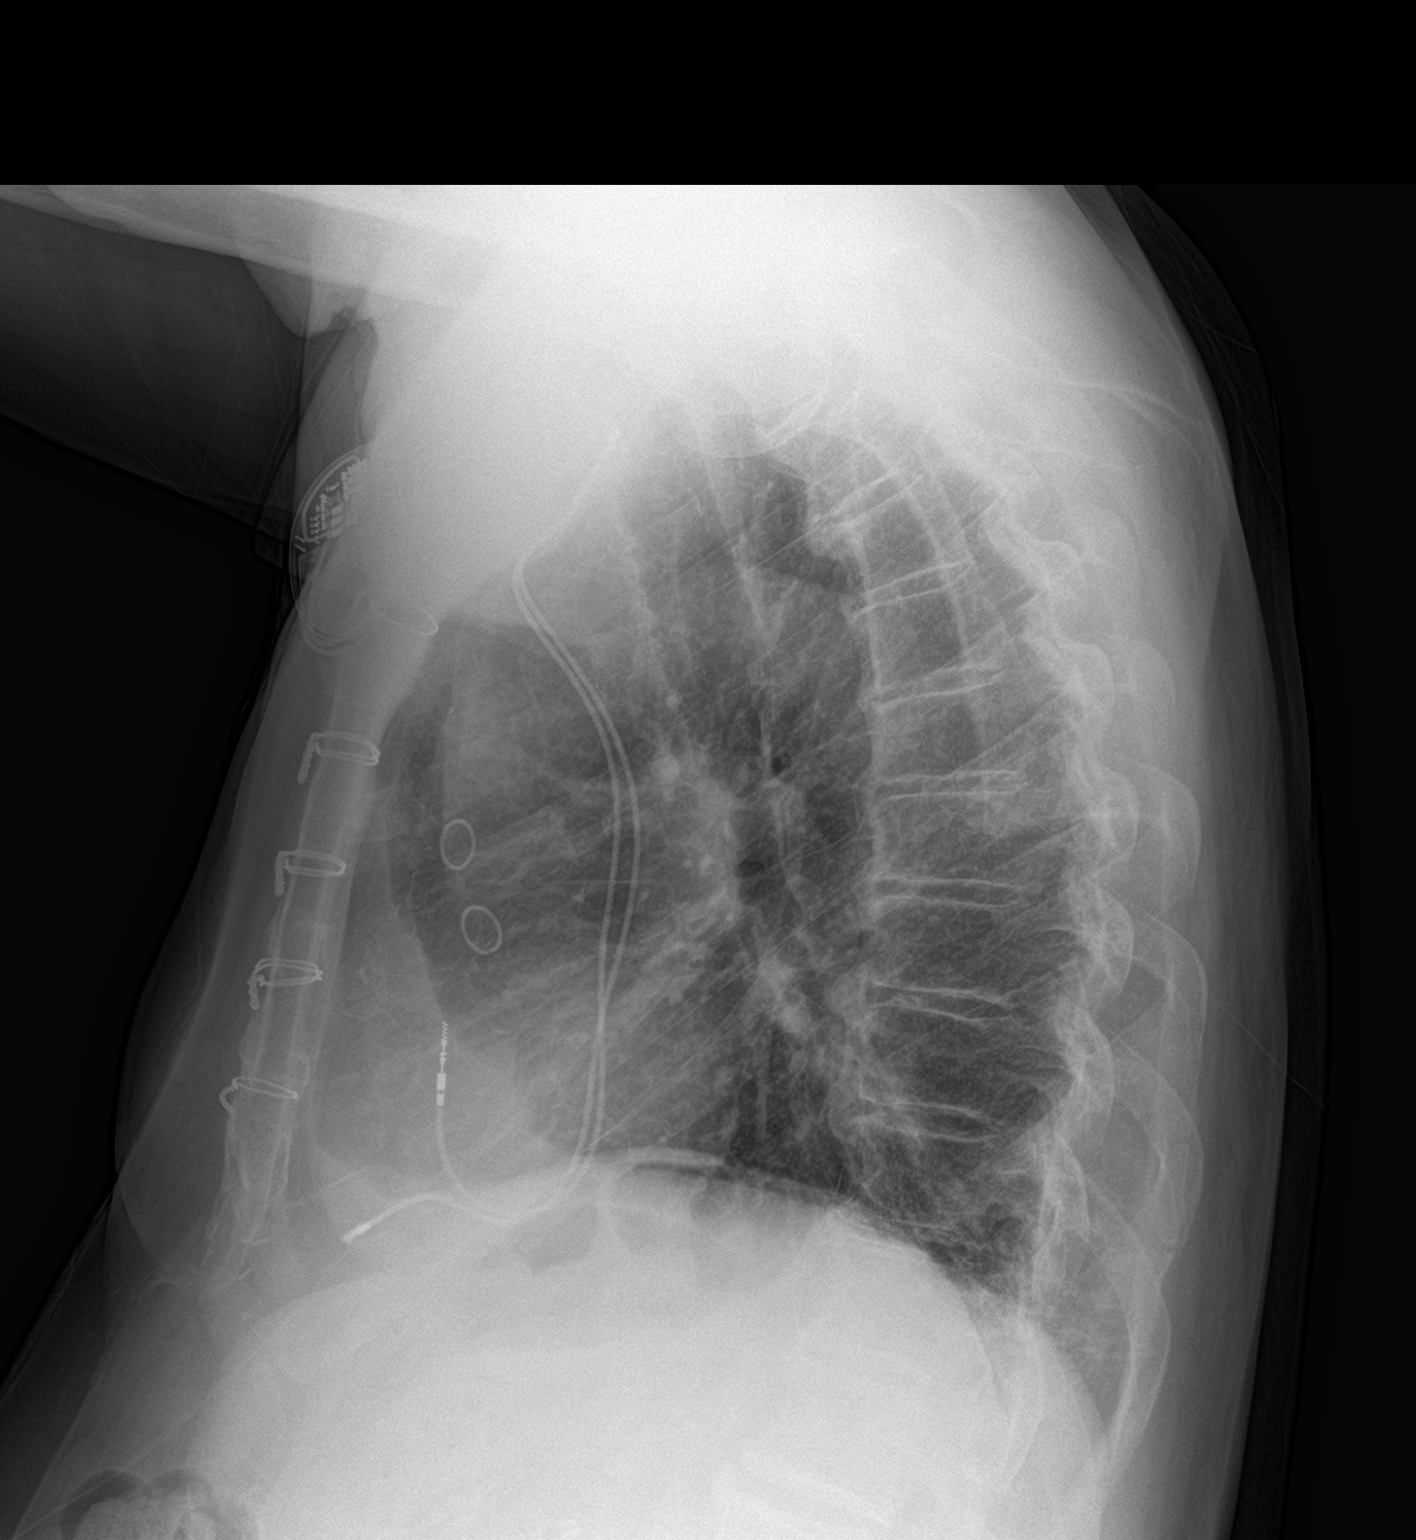

[2 of 2 positions shown; findings below may reference images not displayed]

FINDINGS: The heart size and mediastinal contours are within normal limits.
Left-sided pacemaker is unchanged in position. Status post coronary
bypass graft. No pneumothorax or pleural effusion is noted. Minimal
bibasilar subsegmental atelectasis is noted. The visualized skeletal
structures are unremarkable.
IMPRESSION: Minimal bibasilar subsegmental atelectasis.

## 2020-03-14 IMAGING — CT CT ANGIO CHEST
2 of 7 series · 19 of 46 positions shown · IV contrast (APPLIED)
Comparison: Chest radiography same day.  [DATE] CT

CLINICAL DATA: Shortness of breath and cough. Leukemia. Abnormal
oxygen saturation.

EXAM:
CT ANGIOGRAPHY CHEST WITH CONTRAST
TECHNIQUE: Multidetector CT imaging of the chest was performed using the
standard protocol during bolus administration of intravenous
contrast. Multiplanar CT image reconstructions and MIPs were
obtained to evaluate the vascular anatomy.
CONTRAST:  75mL OMNIPAQUE IOHEXOL 350 MG/ML SOLN

[Series 5: thins · axial · 0.73mm/px · z∈[-347,-82]mm · 16 of 297 slices shown]
[im 16/297  lung]
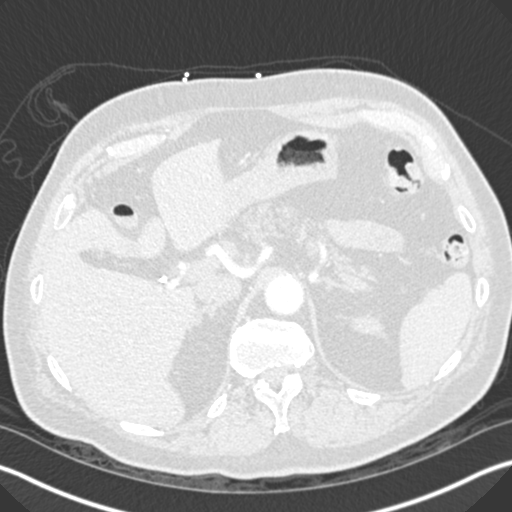
[im 32/297  soft-tissue]
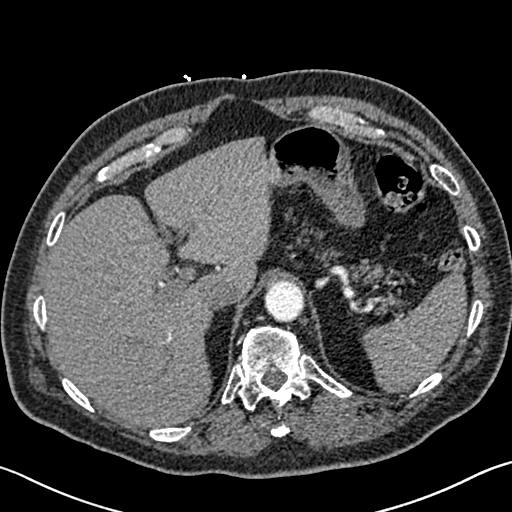
[im 47/297  lung]
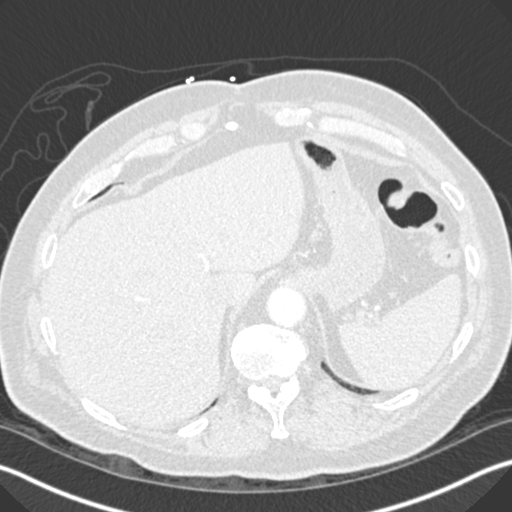
[im 63/297  soft-tissue]
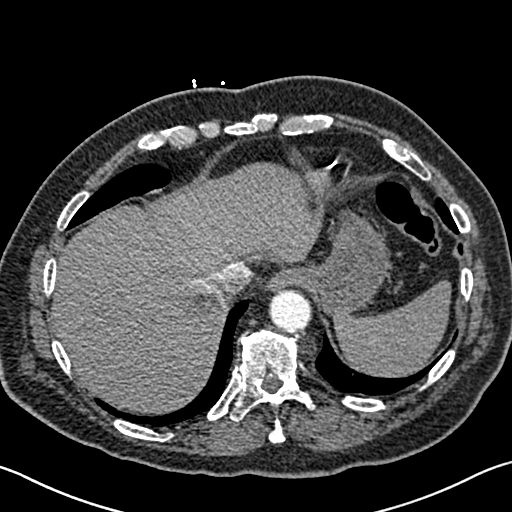
[im 94/297  lung]
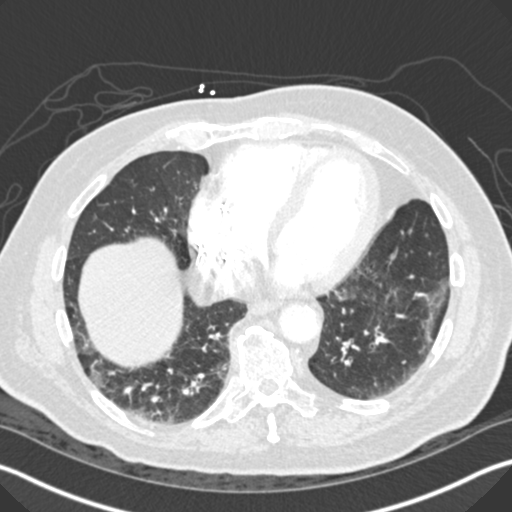
[im 110/297  soft-tissue]
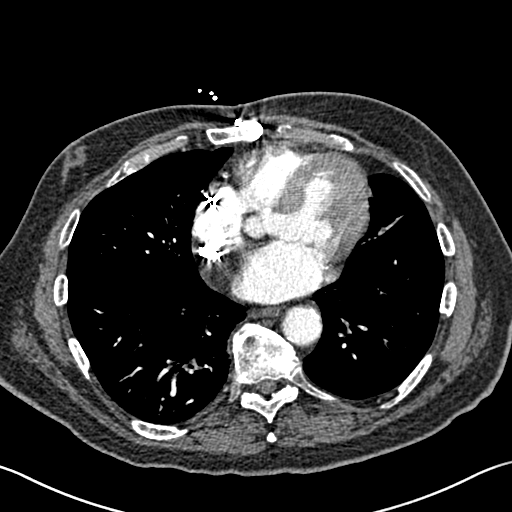
[im 125/297  lung]
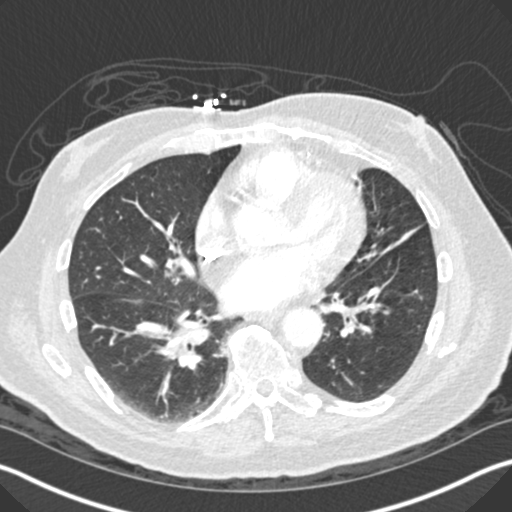
[im 141/297  soft-tissue]
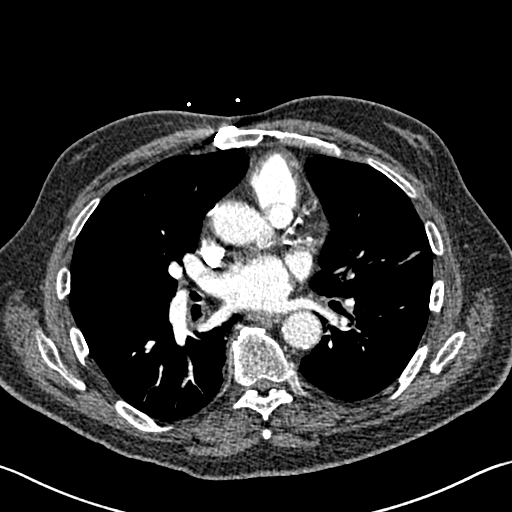
[im 156/297  lung]
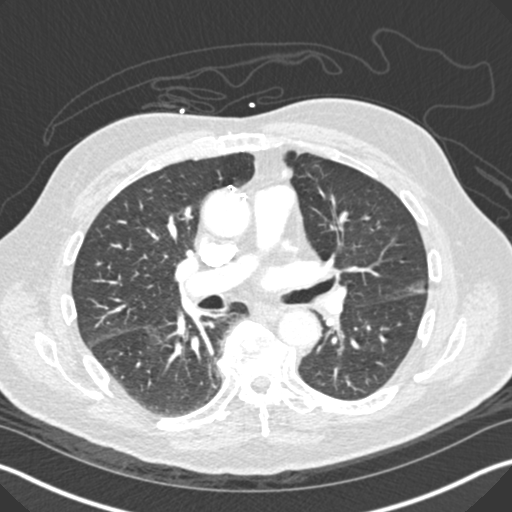
[im 172/297  soft-tissue]
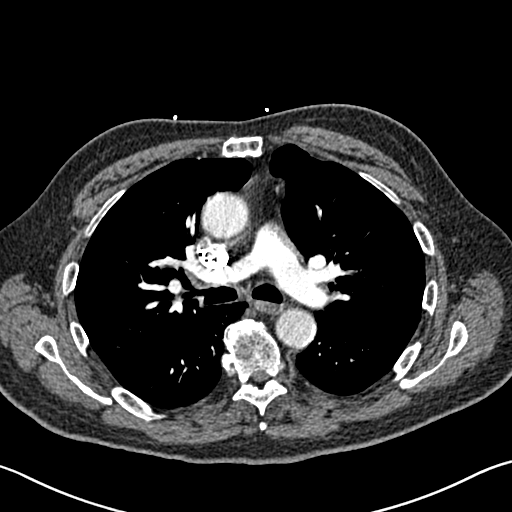
[im 187/297  lung]
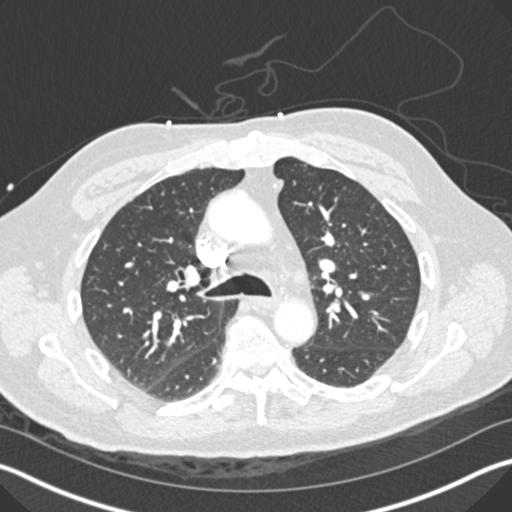
[im 203/297  soft-tissue]
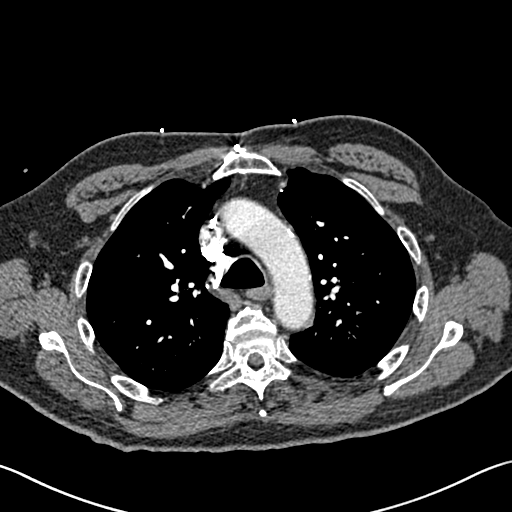
[im 234/297  lung]
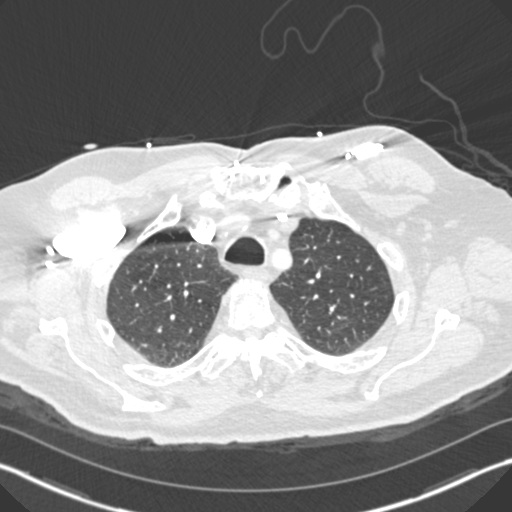
[im 250/297  soft-tissue]
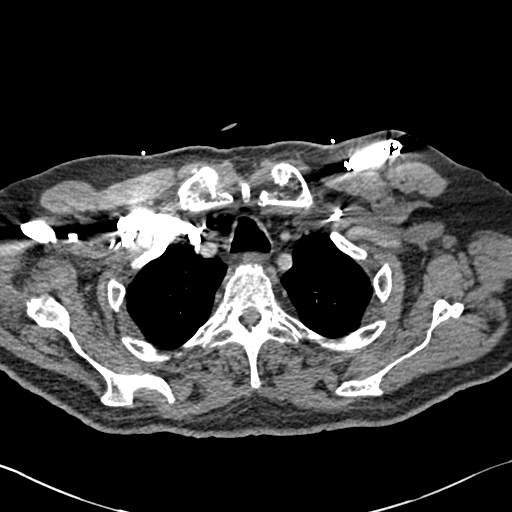
[im 265/297  lung]
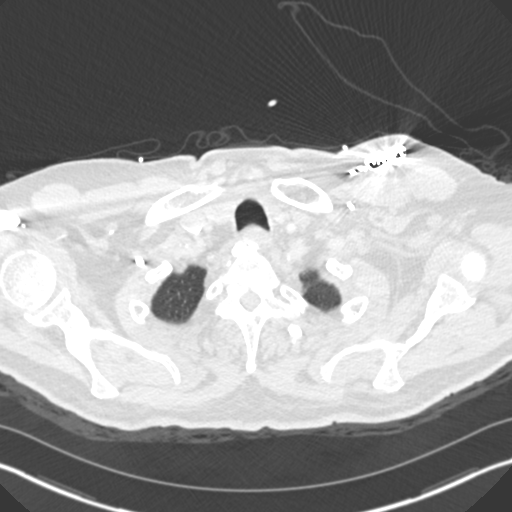
[im 281/297  soft-tissue]
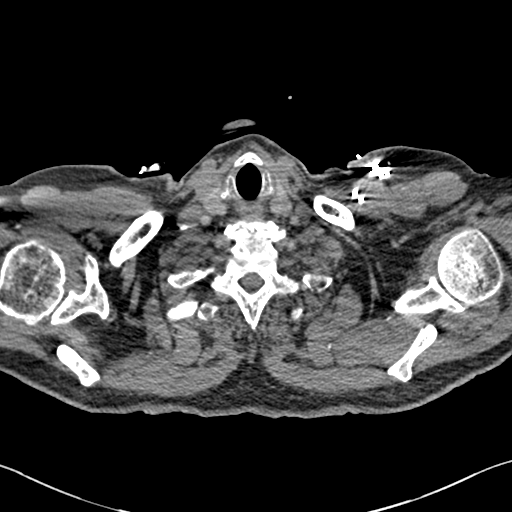

[Series 7: coronal mpr · coronal · 0.63mm/px · 3 of 90 slices shown]
[im 23/90  soft-tissue]
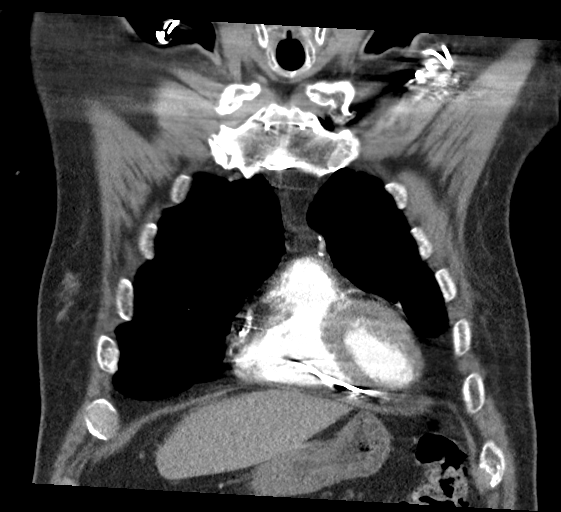
[im 45/90  soft-tissue]
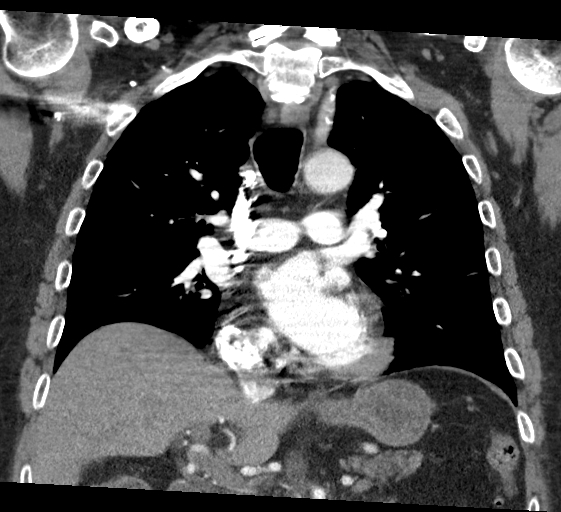
[im 67/90  soft-tissue]
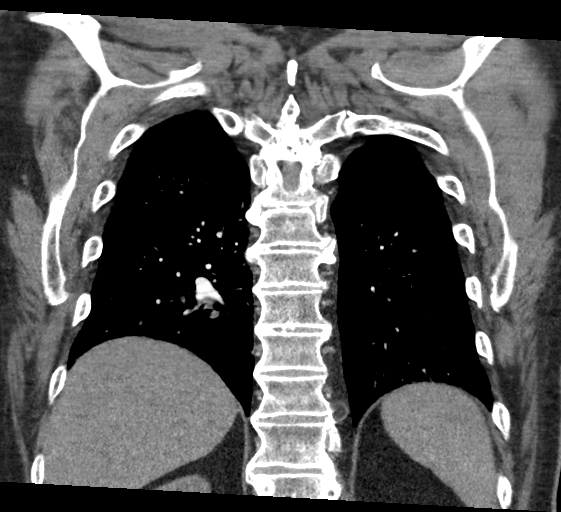

[19 of 46 positions shown; findings below may reference images not displayed]

FINDINGS: Cardiovascular: Pulmonary arterial opacification is good. There are
no pulmonary emboli. Heart size is normal. Pacemaker in place. No
pericardial fluid. Aortic atherosclerosis. Coronary artery
calcification.

Mediastinum/Nodes: No mediastinal or hilar mass or lymphadenopathy.

Lungs/Pleura: No gross bullous emphysema. Chronic scarring in the
lingula. No infiltrate, collapse or effusion. No mass or nodule.

Upper Abdomen: Previous cholecystectomy.  No acute finding.

Musculoskeletal: No fracture.  Chronic bridging osteophytes.

Review of the MIP images confirms the above findings.
IMPRESSION: 1. No pulmonary emboli or other acute chest pathology.
2. Aortic atherosclerosis. Coronary artery calcification.

Aortic Atherosclerosis ([KC]-[KC]).

## 2020-03-14 MED ORDER — AMIODARONE HCL 200 MG PO TABS
100.0000 mg | ORAL_TABLET | Freq: Two times a day (BID) | ORAL | Status: DC
  Administered 2020-03-14 – 2020-03-18 (×8): 100 mg via ORAL
  Filled 2020-03-14 (×9): qty 1

## 2020-03-14 MED ORDER — PREDNISONE 20 MG PO TABS
40.0000 mg | ORAL_TABLET | Freq: Every day | ORAL | Status: DC
  Administered 2020-03-15: 40 mg via ORAL
  Filled 2020-03-14: qty 2

## 2020-03-14 MED ORDER — SODIUM CHLORIDE 0.9 % IV SOLN: Freq: Once | INTRAVENOUS | Status: AC

## 2020-03-14 MED ORDER — IPRATROPIUM-ALBUTEROL 0.5-2.5 (3) MG/3ML IN SOLN
3.0000 mL | Freq: Once | RESPIRATORY_TRACT | Status: AC
  Administered 2020-03-14: 3 mL via RESPIRATORY_TRACT
  Filled 2020-03-14: qty 3

## 2020-03-14 MED ORDER — LEVOFLOXACIN IN D5W 750 MG/150ML IV SOLN
750.0000 mg | INTRAVENOUS | Status: DC
  Administered 2020-03-14: 750 mg via INTRAVENOUS
  Filled 2020-03-14: qty 150

## 2020-03-14 MED ORDER — IOHEXOL 350 MG/ML SOLN
75.0000 mL | Freq: Once | INTRAVENOUS | Status: AC | PRN
  Administered 2020-03-14: 75 mL via INTRAVENOUS

## 2020-03-14 MED ORDER — IPRATROPIUM-ALBUTEROL 0.5-2.5 (3) MG/3ML IN SOLN
3.0000 mL | Freq: Once | RESPIRATORY_TRACT | Status: AC
  Administered 2020-03-14: 3 mL via RESPIRATORY_TRACT

## 2020-03-14 MED ORDER — METOPROLOL TARTRATE 50 MG PO TABS
50.0000 mg | ORAL_TABLET | Freq: Two times a day (BID) | ORAL | Status: DC
  Administered 2020-03-15 – 2020-03-18 (×7): 50 mg via ORAL
  Filled 2020-03-14 (×7): qty 1

## 2020-03-14 MED ORDER — ATORVASTATIN CALCIUM 20 MG PO TABS
40.0000 mg | ORAL_TABLET | Freq: Every day | ORAL | Status: DC
  Administered 2020-03-15 – 2020-03-18 (×4): 40 mg via ORAL
  Filled 2020-03-14 (×4): qty 2

## 2020-03-14 MED ORDER — EZETIMIBE 10 MG PO TABS
10.0000 mg | ORAL_TABLET | Freq: Every day | ORAL | Status: DC
  Administered 2020-03-15 – 2020-03-18 (×4): 10 mg via ORAL
  Filled 2020-03-14 (×4): qty 1

## 2020-03-14 MED ORDER — SODIUM CHLORIDE 0.9 % IV BOLUS
500.0000 mL | Freq: Once | INTRAVENOUS | Status: AC
  Administered 2020-03-14: 500 mL via INTRAVENOUS

## 2020-03-14 MED ORDER — IPRATROPIUM-ALBUTEROL 0.5-2.5 (3) MG/3ML IN SOLN
3.0000 mL | Freq: Once | RESPIRATORY_TRACT | Status: AC
  Administered 2020-03-14: 3 mL via RESPIRATORY_TRACT
  Filled 2020-03-14: qty 6

## 2020-03-14 MED ORDER — APIXABAN 5 MG PO TABS
5.0000 mg | ORAL_TABLET | Freq: Two times a day (BID) | ORAL | Status: DC
  Administered 2020-03-14 – 2020-03-18 (×8): 5 mg via ORAL
  Filled 2020-03-14 (×8): qty 1

## 2020-03-14 MED ORDER — ACYCLOVIR 200 MG PO CAPS
400.0000 mg | ORAL_CAPSULE | Freq: Two times a day (BID) | ORAL | Status: DC
  Administered 2020-03-14 – 2020-03-18 (×8): 400 mg via ORAL
  Filled 2020-03-14 (×6): qty 2
  Filled 2020-03-14: qty 1
  Filled 2020-03-14 (×4): qty 2

## 2020-03-14 MED ORDER — IPRATROPIUM-ALBUTEROL 0.5-2.5 (3) MG/3ML IN SOLN
3.0000 mL | Freq: Four times a day (QID) | RESPIRATORY_TRACT | Status: DC
  Administered 2020-03-15 (×4): 3 mL via RESPIRATORY_TRACT
  Filled 2020-03-14 (×3): qty 3

## 2020-03-14 NOTE — ED Triage Notes (Signed)
Pt presents via [facility] with c/o shortness of breath and cough. Pt active leukemia patient. Pt diaphoretic in triage. Pt respirations even and labored at this time. Pt 89% on room air. Pt has hx of leukemia.

## 2020-03-14 NOTE — ED Notes (Addendum)
See triage note, pt reports COPD hx, increased SHOB over the last week, not resolved by care with PCP. Pt speaking in short sentences, labored RR noted. Productive cough present. Alert and oriented. Pt appears lethargic and diaphoretic

## 2020-03-14 NOTE — H&P (Signed)
[facility] History and Physical  [name] KDX:[id number] DOB: 09-Sep-1945 DOA: 03/14/2020  Referring physician: Dr. Ellender Hose PCP: Leone Haven, MD   Chief Complaint: cough  HPI: [name] is a 74 y.o. male chronic CLL, COPD not on home oxygen, diastolic CHF, paroxysmal A. fib status post pacemaker placement, hypertension, hyperlipidemia, who presents with worsening cough.  Review of chart shows he called both his PCP and his oncologist 2 days ago to report feeling unwell, cough productive of yellow sputum, and weakness.  His oncologist instructed him to hold his Venaclax, prescribed him a Z-Pak, and sent him for chest x-ray which was negative for acute pathology.  His oncologist followed up with him the following day to let him know about his clear chest x-ray and also to send him a prescription for a short prednisone burst.  His oncologist again followed up with him today where he reported to them that he had had a negative Covid test at [facility].  He recommended that he come in to be evaluated in person however given his report of extreme dyspnea he was instead directed to present to the emergency department.  On my interview patient confirms the above history.  States that he has felt really awful for the past 3 to 4 days, has had a really bad cough has been productive of sputum and that his breathing has been "terrible".  He confirms that he is taking the medications that he has been prescribed without any improvement in his symptoms.  In the emergency department initial vital signs notable for tachypnea to 23, and oxygen saturation 90% on room air.  Patient was subsequently ambulated and desaturated into the mid 80s and was subsequently placed on 2 L nasal cannula.  Exam by ED provider notable for wheezes.  His lab work-up was notable for an unremarkable CMP, a CBC with mild anemia and thrombocytopenia at his baseline, negative Covid test, and a CTPA that was negative for PE or  other acute chest pathology.  He was admitted for acute COPD exacerbation.  Review of Systems:  Pertinent positives and negative per HPI, all others reviewed and negative   Past Medical History:  Diagnosis Date  . Anxiety   . Arthritis   . Atrial fibrillation ([place])    a. Dx 2013, recurred 02/2014, CHA2DS2VASc = 3 -->placed on Eliquis;  b. 02/2014 Echo: EF 50-55%, mid and apical anterior septum and mid and apical inf septum are abnl, mild to mod Ao sclerosis w/o AS.  Marland Kitchen Cancer associated pain   . Chicken pox   . Chronic lymphocytic leukemia ([place])    a. Dx 02/2014.  Marland Kitchen CLL (chronic lymphocytic leukemia) ([place])   . Complication of anesthesia    History of  PTSD--do not touch patient when waking up from surgery.  Marland Kitchen COPD (chronic obstructive pulmonary disease) ([place])   . Coronary artery disease    a. 04/2009 CABG x 3 (LIMA->LAD, VG->OM1, VG->PDA);  b. 09/2009 Cath: occluded VG x 2 w/ patent LIMA and L->R collats. EF 55%, mild antlat HK;  c. 10/2011 MV: EF 53%, Doyle isch/infarct-->low risk.  Marland Kitchen Dysrhythmia    hx of a-fib  . GERD (gastroesophageal reflux disease)    occasional  . History of chemotherapy 2015-2016  . HOH (hard of hearing)    Bilateral Hearing Aids  . Hypertension   . Myocardial infarction ([place]) 2010  . OSA on CPAP    USE C-PAP  . Presence of permanent cardiac pacemaker 2017  .  PTSD (post-traumatic stress disorder)   . PTSD (post-traumatic stress disorder)   . Pure hypercholesterolemia   . Rheumatic fever 1959  . 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 TIA (transient ischemic attack) 11/02/2015   Past Surgical History:  Procedure Laterality Date  . ABDOMINAL HERNIA REPAIR    . APPENDECTOMY  06/21/1985  . CARDIAC CATHETERIZATION  2010; 2011   ; Dr Fletcher Anon  . CORONARY ARTERY BYPASS GRAFT  04/2009   "CABG X3"  . EP IMPLANTABLE DEVICE N/A 03/03/2016   Procedure: Pacemaker Implant;  Surgeon: Deboraha Sprang, MD;  Location: [facility];  Service:  Cardiovascular;  Laterality: N/A;  . FOREIGN BODY REMOVAL  1968   "shrapnel in my tailbone"  . INGUINAL HERNIA REPAIR Right   . INSERT / REPLACE / REMOVE PACEMAKER    . JOINT REPLACEMENT Right 2018  . LAPAROSCOPIC CHOLECYSTECTOMY    . TONSILLECTOMY AND ADENOIDECTOMY  1956  . TOTAL HIP ARTHROPLASTY Right 10/22/2016   Procedure: TOTAL HIP ARTHROPLASTY;  Surgeon: Dereck Leep, MD;  Location: [facility];  Service: Orthopedics;  Laterality: Right;  . TOTAL HIP ARTHROPLASTY Left 11/04/2017   Procedure: TOTAL HIP ARTHROPLASTY;  Surgeon: Dereck Leep, MD;  Location: [facility];  Service: Orthopedics;  Laterality: Left;   Social History:  reports that he quit smoking about 13 years ago. His smoking use included cigarettes. He has a 40.00 pack-year smoking history. He has never used smokeless tobacco. He reports previous alcohol use. He reports that he does not use drugs.  Doyle Known Allergies  Family History  Problem Relation Age of Onset  . Heart disease Mother   . Heart attack Mother   . Coronary artery disease Other        family history    Prior to Admission medications   Medication Sig Start Date End Date Taking? Authorizing Provider  acetaminophen (TYLENOL) 500 MG tablet Take 1,000 mg by mouth every 8 (eight) hours as needed for mild pain.    [provider]  acyclovir (ZOVIRAX) 400 MG tablet Take 1 tablet (400 mg total) by mouth 2 (two) times daily. 11/23/19   Cammie Sickle, MD  albuterol (PROVENTIL HFA;VENTOLIN HFA) 108 (90 Base) MCG/ACT inhaler Inhale 2 puffs into the lungs every 6 (six) hours as needed for wheezing or shortness of breath. 12/01/16   Wilhelmina Mcardle, MD  amiodarone (PACERONE) 200 MG tablet Take 1/2 (one-half) tablet by mouth twice daily 01/13/20   [name], MD  apixaban (ELIQUIS) 5 MG TABS tablet Take 5 mg by mouth 2 (two) times daily.    [provider]  atorvastatin (LIPITOR) 40 MG tablet Take 40 mg by mouth daily. Start back slowly with  20 mg working up on slow titration to 40mg  per Dr Rockey Situ.    [provider]  azithromycin (ZITHROMAX Z-PAK) 250 MG tablet Take 2 tablets on day 1 and one tablet days 2-5 day 03/12/20   Cammie Sickle, MD  budesonide-formoterol [facility]) 160-4.5 MCG/ACT inhaler Inhale 2 puffs into the lungs 2 (two) times daily. 12/01/16   Wilhelmina Mcardle, MD  cetirizine (ZYRTEC) 10 MG tablet Take 10 mg by mouth daily as needed for allergies.     [provider]  chlorpheniramine-HYDROcodone (TUSSIONEX) 10-8 MG/5ML SUER Take 5 mLs by mouth at bedtime as needed for cough. 03/13/20   Cammie Sickle, MD  Coenzyme Q10 (COQ10) 200 MG CAPS Take 200 mg by mouth daily.  [provider]  ezetimibe (ZETIA) 10 MG tablet Take 1 tablet (10 mg total) by mouth daily. 01/06/17 11/01/27  [name], MD  furosemide (LASIX) 20 MG tablet Take 1 tablet (20 mg) by mouth twice daily as needed     [provider]  Javier Docker Oil 350 MG CAPS Take 350 mg by mouth as needed.     [provider]  metoprolol tartrate (LOPRESSOR) 50 MG tablet Take 1 tablet (50 mg total) by mouth 2 (two) times daily. 02/24/20 08/22/20  Loel Dubonnet, NP  mirtazapine (REMERON) 15 MG tablet Take 15 mg by mouth at bedtime as needed (for panic associated with PTSD).     [provider]  Multiple Vitamin (MULTIVITAMIN WITH MINERALS) TABS tablet Take 1 tablet by mouth daily.    [provider]  nitroGLYCERIN (NITROSTAT) 0.4 MG SL tablet Place 1 tablet (0.4 mg total) under the tongue every 5 (five) minutes as needed for chest pain. 01/06/17   [name], MD  predniSONE (DELTASONE) 20 MG tablet 3 tablets once a day x 4 days; 2 tablets x 3 days and then 1 tablet one a day. 03/13/20   Cammie Sickle, MD  traMADol (ULTRAM) 50 MG tablet Take 1 tablet (50 mg total) by mouth every 12 (twelve) hours as needed. 11/24/19   Cammie Sickle, MD  venetoclax (VENCLEXTA) 10 MG TABS Take 1  tablet by mouth daily.    [provider]  venetoclax (VENCLEXTA) 100 MG TABS Take 200 mg by mouth daily. (Take 2 tablets daily) Patient taking differently: Take 100 mg by mouth daily.  11/14/19   Cammie Sickle, MD   Physical Exam: Vitals:   03/14/20 1649 03/14/20 1730 03/14/20 1900 03/14/20 1930  BP:  96/61 (!) 111/58 (!) 101/55  Pulse: 62 60 68 62  Resp: 13 (!) 23 18 20   Temp:      SpO2: 94% 93% 98% 96%  Weight:        Wt Readings from Last 3 Encounters:  03/14/20 98.5 kg  02/24/20 98.5 kg  02/23/20 97.3 kg    General:  Appears ill, uncomfortable Eyes: PERRL, normal lids, irises & conjunctiva ENT: grossly normal hearing, lips & tongue Neck: Doyle masses Cardiovascular: RRR, Doyle m/r/g. Doyle LE edema.  JVD not elevated Respiratory: Mildly increased work of breathing but speaking in full sentences.  Good air movement throughout but crackles at the right lower base and diffuse expiratory wheezes. Abdomen: soft, ntnd Skin: Doyle rash or induration seen on limited exam Musculoskeletal: grossly normal tone BUE/BLE Psychiatric: grossly normal mood and affect, speech fluent and appropriate Neurologic: grossly non-focal.          Labs on Admission:  Basic Metabolic Panel: Recent Labs  Lab 03/14/20 1542  NA 138  K 4.0  CL 103  CO2 22  GLUCOSE 136*  BUN 15  CREATININE 0.99  CALCIUM 8.6*   Liver Function Tests: Recent Labs  Lab 03/14/20 1542  AST 29  ALT 34  ALKPHOS 117  BILITOT 1.7*  PROT 6.3*  ALBUMIN 4.0   Doyle results for input(s): LIPASE, AMYLASE in the last 168 hours. Doyle results for input(s): AMMONIA in the last 168 hours. CBC: Recent Labs  Lab 03/14/20 1542  WBC 9.0  HGB 12.6*  HCT 37.0*  MCV 100.3*  PLT 121*   Cardiac Enzymes: Doyle results for input(s): CKTOTAL, CKMB, CKMBINDEX, TROPONINI in the last 168 hours.  BNP (last 3 results) Recent Labs  02/03/20 1254 03/14/20 1542  BNP 111.1* 93.9    ProBNP (last 3 results) Doyle results for  input(s): PROBNP in the last 8760 hours.  CBG: Doyle results for input(s): GLUCAP in the last 168 hours.  Radiological Exams on Admission: DG Chest 2 View  Result Date: 03/14/2020 CLINICAL DATA:  Shortness of breath. EXAM: CHEST - 2 VIEW COMPARISON:  March 12, 2020. FINDINGS: The heart size and mediastinal contours are within normal limits. 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 Status post coronary bypass graft. Doyle pneumothorax or pleural effusion is noted. Minimal bibasilar subsegmental atelectasis is noted. The visualized skeletal structures are unremarkable. IMPRESSION: Minimal bibasilar subsegmental atelectasis. Electronically Signed   By: Marijo Conception M.D.   On: 03/14/2020 16:22   CT Angio Chest PE W and/or Wo Contrast  Result Date: 03/14/2020 CLINICAL DATA:  Shortness of breath and cough. Leukemia. Abnormal oxygen saturation. EXAM: CT ANGIOGRAPHY CHEST WITH CONTRAST TECHNIQUE: Multidetector CT imaging of the chest was performed using the standard protocol during bolus administration of intravenous contrast. Multiplanar CT image reconstructions and MIPs were obtained to evaluate the vascular anatomy. CONTRAST:  15mL OMNIPAQUE IOHEXOL 350 MG/ML SOLN COMPARISON:  Chest radiography same day.  02/03/2020 CT FINDINGS: Cardiovascular: Pulmonary arterial opacification is good. There are Doyle pulmonary emboli. Heart size is normal. Pacemaker in place. Doyle pericardial fluid. Aortic atherosclerosis. Coronary artery calcification. Mediastinum/Nodes: Doyle mediastinal or hilar mass or lymphadenopathy. Lungs/Pleura: Doyle gross bullous emphysema. Chronic scarring in the lingula. Doyle infiltrate, collapse or effusion. Doyle mass or nodule. Upper Abdomen: Previous cholecystectomy.  Doyle acute finding. Musculoskeletal: Doyle fracture.  Chronic bridging osteophytes. Review of the MIP images confirms the above findings. IMPRESSION: 1. Doyle pulmonary emboli or other acute chest pathology. 2. Aortic atherosclerosis. Coronary  artery calcification. Aortic Atherosclerosis (ICD10-I70.0). Electronically Signed   By: Nelson Chimes M.D.   On: 03/14/2020 18:09    EKG: Independently reviewed.  Paced rhythm, some slurring of ST segment in V2 and V3 the T waves are upright, Doyle other overt ischemic changes, Doyle changes compared to prior.  Assessment/Plan Active Problems:   Hyperlipidemia   Essential hypertension   Paroxysmal atrial fibrillation (HCC)   COPD (chronic obstructive pulmonary disease) (HCC)   CLL (chronic lymphocytic leukemia) (HCC)   PTSD (post-traumatic stress disorder)   Chronic diastolic CHF (congestive heart failure) (HCC)   TIA (transient ischemic attack)   PAD (peripheral artery disease) (HCC)   OSA on CPAP   CAD (coronary artery disease)   Depression   COPD with acute exacerbation (HCC)   #COPD Exacerbation Patient presenting with wheezing and mild hypoxemia in setting of multiple negative Covid test most consistent with acute COPD exacerbation.  Doyle clear precipitant, will obtain respiratory viral panel.  Appears euvolemic on exam do not have suspicion for CHF at this time.  Given multiple medical comorbidities and immunosuppression due to chemotherapy we will treat this as severe COPD exacerbation. -Follow-up respiratory viral panel -Prednisone 40 mg p.o. x5 days -Levaquin 750 mg once daily x5 days -Scheduled nebs  #Chronic medical problems Shingles reflexes: Continue acyclovir twice daily A. fib: Continue amiodarone, apixaban Hyperlipidemia continue atorvastatin, Zetia Diastolic CHF: Appears euvolemic on exam exam, hold Lasix which she only takes as needed regardless.  Continue metoprolol tartrate 50 twice daily Chronic CLL: Per outpatient documentation oncologist would like him to hold Venclexta, recommend message in the a.m. to confirm   Code Status: Full Code confirmed DVT Prophylaxis: on Eliquis Family Communication: none Disposition Plan: inpatient  Time spent:  50 min  Clarnce Flock MD/[facility]

## 2020-03-14 NOTE — ED Notes (Addendum)
Pt placed on 2L [place] d/t desat 88%. Dr Ellender Hose aware Pt does not wear O2 at home

## 2020-03-14 NOTE — ED Notes (Signed)
Per EMS report, patient has COPD and has c/o increasing shortness of breath for 4 days. Patient was given a duoneb, albuterol and 125mg  Solumedrol IV en route.  97% RA, 121/65, 91 pulse, RR 26

## 2020-03-14 NOTE — ED Notes (Signed)
Verbal order for 500 NS bolus Dr Dione Plover

## 2020-03-14 NOTE — Telephone Encounter (Signed)
Per patient Oxygen sats at rest 94% and HR 64 Oxygen sats when ambulating - 91-92% and HR 91

## 2020-03-14 NOTE — ED Provider Notes (Signed)
[facility] Emergency Department Provider Note  ____________________________________________   None    (approximate)  I have reviewed the triage vital signs and the nursing notes.   HISTORY  Chief Complaint Shortness of Breath    HPI [name] is a 74 y.o. male  With h/o CLL, COPD, AFib s/p PM placement, here with weakness, cough. Over the past 1 week, pt has had persistently worsening cough, weakness, and fatigue. Sx started with mild cough productive of yellow-green sputum, with some increased wheezing and SOB. He has been seen twice by his oncologist and started on ABX as well as prednisone taper, without significant improvement. Pt is increasingly weak and reports poor appetite, difficulty getting around the house due to this. No alleviating factors. He has been checking his pulse ox which is in mid 90s at home, though desats w/ ambulation. He received both COVID vaccinations and reports that he was recently tested and negative for this.        Past Medical History:  Diagnosis Date  . Anxiety   . Arthritis   . Atrial fibrillation ([place])    a. Dx 2013, recurred 02/2014, CHA2DS2VASc = 3 -->placed on Eliquis;  b. 02/2014 Echo: EF 50-55%, mid and apical anterior septum and mid and apical inf septum are abnl, mild to mod Ao sclerosis w/o AS.  Marland Kitchen Cancer associated pain   . Chicken pox   . Chronic lymphocytic leukemia ([place])    a. Dx 02/2014.  Marland Kitchen CLL (chronic lymphocytic leukemia) ([place])   . Complication of anesthesia    History of  PTSD--do not touch patient when waking up from surgery.  Marland Kitchen COPD (chronic obstructive pulmonary disease) ([place])   . Coronary artery disease    a. 04/2009 CABG x 3 (LIMA->LAD, VG->OM1, VG->PDA);  b. 09/2009 Cath: occluded VG x 2 w/ patent LIMA and L->R collats. EF 55%, mild antlat HK;  c. 10/2011 MV: EF 53%, no isch/infarct-->low risk.  Marland Kitchen Dysrhythmia    hx of a-fib  . GERD (gastroesophageal reflux disease)    occasional  . History  of chemotherapy 2015-2016  . HOH (hard of hearing)    Bilateral Hearing Aids  . Hypertension   . Myocardial infarction ([place]) 2010  . OSA on CPAP    USE C-PAP  . Presence of permanent cardiac pacemaker 2017  . PTSD (post-traumatic stress disorder)   . PTSD (post-traumatic stress disorder)   . Pure hypercholesterolemia   . Rheumatic fever 1959  . 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 TIA (transient ischemic attack) 11/02/2015    Patient Active Problem List   Diagnosis Date Noted  . CAD (coronary artery disease)   . Depression   . Atrial fibrillation ([place])   . Arthritis 09/16/2019  . Elevated glucose 05/13/2019  . Dehydration 03/14/2019  . Sick sinus syndrome ([place]) 07/08/2016  . Goals of care, counseling/discussion 07/08/2016  . 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 Chronic fatigue   . Thrombocytopenia ([place]) 02/29/2016  . 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 PAD (peripheral artery disease) ([place])   . OSA on CPAP   . TIA (transient ischemic attack) 11/02/2015  . Chronic diastolic CHF (congestive heart failure) ([place]) 09/20/2015  . CLL (chronic lymphocytic leukemia) ([place])   . PTSD (post-traumatic stress disorder)   . Chest pain 08/22/2015  . Osteoarthritis of both knees 07/05/2015  . COPD (chronic obstructive pulmonary disease) ([place]) 01/01/2012  . Shortness of  breath 10/09/2011  . Paroxysmal atrial fibrillation ([place]) 10/09/2011  . Hyperlipidemia 11/28/2009  . Essential hypertension 11/28/2009    Past Surgical History:  Procedure Laterality Date  . ABDOMINAL HERNIA REPAIR    . APPENDECTOMY  06/21/1985  . CARDIAC CATHETERIZATION  2010; 2011   ; Dr Fletcher Anon  . CORONARY ARTERY BYPASS GRAFT  04/2009   "CABG X3"  . EP IMPLANTABLE DEVICE N/A 03/03/2016   Procedure: Pacemaker Implant;  Surgeon: Deboraha Sprang, MD;  Location: [place] [facility];  Service: Cardiovascular;  Laterality: N/A;  . FOREIGN BODY REMOVAL  1968    "shrapnel in my tailbone"  . INGUINAL HERNIA REPAIR Right   . INSERT / REPLACE / REMOVE PACEMAKER    . JOINT REPLACEMENT Right 2018  . LAPAROSCOPIC CHOLECYSTECTOMY    . TONSILLECTOMY AND ADENOIDECTOMY  1956  . TOTAL HIP ARTHROPLASTY Right 10/22/2016   Procedure: TOTAL HIP ARTHROPLASTY;  Surgeon: Dereck Leep, MD;  Location: [facility] ORS;  Service: Orthopedics;  Laterality: Right;  . TOTAL HIP ARTHROPLASTY Left 11/04/2017   Procedure: TOTAL HIP ARTHROPLASTY;  Surgeon: Dereck Leep, MD;  Location: [facility] ORS;  Service: Orthopedics;  Laterality: Left;    Prior to Admission medications   Medication Sig Start Date End Date Taking? Authorizing Provider  acetaminophen (TYLENOL) 500 MG tablet Take 1,000 mg by mouth every 8 (eight) hours as needed for mild pain.    [provider]  acyclovir (ZOVIRAX) 400 MG tablet Take 1 tablet (400 mg total) by mouth 2 (two) times daily. 11/23/19   Cammie Sickle, MD  albuterol (PROVENTIL HFA;VENTOLIN HFA) 108 (90 Base) MCG/ACT inhaler Inhale 2 puffs into the lungs every 6 (six) hours as needed for wheezing or shortness of breath. 12/01/16   Wilhelmina Mcardle, MD  amiodarone (PACERONE) 200 MG tablet Take 1/2 (one-half) tablet by mouth twice daily 01/13/20   [name], MD  apixaban (ELIQUIS) 5 MG TABS tablet Take 5 mg by mouth 2 (two) times daily.    [provider]  atorvastatin (LIPITOR) 40 MG tablet Take 40 mg by mouth daily. Start back slowly with 20 mg working up on slow titration to 40mg  per Dr Rockey Situ.    [provider]  azithromycin (ZITHROMAX Z-PAK) 250 MG tablet Take 2 tablets on day 1 and one tablet days 2-5 day 03/12/20   Cammie Sickle, MD  budesonide-formoterol [facility]) 160-4.5 MCG/ACT inhaler Inhale 2 puffs into the lungs 2 (two) times daily. 12/01/16   Wilhelmina Mcardle, MD  cetirizine (ZYRTEC) 10 MG tablet Take 10 mg by mouth daily as needed for allergies.     [provider]   chlorpheniramine-HYDROcodone (TUSSIONEX) 10-8 MG/5ML SUER Take 5 mLs by mouth at bedtime as needed for cough. 03/13/20   Cammie Sickle, MD  Coenzyme Q10 (COQ10) 200 MG CAPS Take 200 mg by mouth daily.    [provider]  ezetimibe (ZETIA) 10 MG tablet Take 1 tablet (10 mg total) by mouth daily. 01/06/17 11/01/27  [name], MD  furosemide (LASIX) 20 MG tablet Take 1 tablet (20 mg) by mouth twice daily as needed     [provider]  Javier Docker Oil 350 MG CAPS Take 350 mg by mouth as needed.     [provider]  metoprolol tartrate (LOPRESSOR) 50 MG tablet Take 1 tablet (50 mg total) by mouth 2 (two) times daily. 02/24/20 08/22/20  Loel Dubonnet, NP  mirtazapine (REMERON) 15 MG tablet Take 15 mg  by mouth at bedtime as needed (for panic associated with PTSD).     [provider]  Multiple Vitamin (MULTIVITAMIN WITH MINERALS) TABS tablet Take 1 tablet by mouth daily.    [provider]  nitroGLYCERIN (NITROSTAT) 0.4 MG SL tablet Place 1 tablet (0.4 mg total) under the tongue every 5 (five) minutes as needed for chest pain. 01/06/17   [name], MD  predniSONE (DELTASONE) 20 MG tablet 3 tablets once a day x 4 days; 2 tablets x 3 days and then 1 tablet one a day. 03/13/20   Cammie Sickle, MD  traMADol (ULTRAM) 50 MG tablet Take 1 tablet (50 mg total) by mouth every 12 (twelve) hours as needed. 11/24/19   Cammie Sickle, MD  venetoclax (VENCLEXTA) 10 MG TABS Take 1 tablet by mouth daily.    [provider]  venetoclax (VENCLEXTA) 100 MG TABS Take 200 mg by mouth daily. (Take 2 tablets daily) Patient taking differently: Take 100 mg by mouth daily.  11/14/19   Cammie Sickle, MD    Allergies Patient has no known allergies.  Family History  Problem Relation Age of Onset  . Heart disease Mother   . Heart attack Mother   . Coronary artery disease Other        family history    Social History Social History    Tobacco Use  . Smoking status: Former Smoker    Packs/day: 1.00    Years: 40.00    Pack years: 40.00    Types: Cigarettes    Quit date: 07/21/2006    Years since quitting: 13.6  . Smokeless tobacco: Never Used  Vaping Use  . Vaping Use: Former  Substance Use Topics  . Alcohol use: Not Currently  . Drug use: No    Review of Systems  Review of Systems  Constitutional: Positive for chills and fatigue. Negative for fever.  HENT: Negative for sore throat.   Respiratory: Positive for cough, shortness of breath and wheezing.   Cardiovascular: Positive for chest pain.  Gastrointestinal: Positive for nausea. Negative for abdominal pain.  Genitourinary: Negative for flank pain.  Musculoskeletal: Negative for neck pain.  Skin: Negative for rash and wound.  Allergic/Immunologic: Negative for immunocompromised state.  Neurological: Positive for weakness. Negative for numbness.  Hematological: Does not bruise/bleed easily.  All other systems reviewed and are negative.    ____________________________________________  PHYSICAL EXAM:      VITAL SIGNS: ED Triage Vitals  Enc Vitals Group     BP 03/14/20 1537 110/80     Pulse Rate 03/14/20 1537 (!) 58     Resp 03/14/20 1537 (!) 23     Temp 03/14/20 1537 98.8 F (37.1 C)     Temp src --      SpO2 03/14/20 1537 90 %     Weight 03/14/20 1549 217 lb 2 oz (98.5 kg)     Height --      Head Circumference --      Peak Flow --      Pain Score 03/14/20 1549 10     Pain Loc --      Pain Edu? --      Excl. in [place]? --      Physical Exam Vitals and nursing note reviewed.  Constitutional:      General: He is not in acute distress.    Appearance: He is well-developed.     Comments: Fatigued, ill-appearing but non-toxic  HENT:  Head: Normocephalic and atraumatic.     Mouth/Throat:     Comments: Dry mm Eyes:     Conjunctiva/sclera: Conjunctivae normal.  Cardiovascular:     Rate and Rhythm: Normal rate and regular rhythm.      Heart sounds: Normal heart sounds. No murmur heard.  No friction rub.  Pulmonary:     Effort: Pulmonary effort is normal. No respiratory distress.     Breath sounds: Examination of the right-lower field reveals rales. Examination of the left-lower field reveals rales. Wheezing and rales present.  Abdominal:     General: There is no distension.     Palpations: Abdomen is soft.     Tenderness: There is no abdominal tenderness.  Musculoskeletal:     Cervical back: Neck supple.  Skin:    General: Skin is warm.     Capillary Refill: Capillary refill takes less than 2 seconds.  Neurological:     Mental Status: He is alert and oriented to person, place, and time.     Motor: No abnormal muscle tone.       ____________________________________________   LABS (all labs ordered are listed, but only abnormal results are displayed)  Labs Reviewed  BASIC METABOLIC PANEL - Abnormal; Notable for the following components:      Result Value   Glucose, Bld 136 (*)    Calcium 8.6 (*)    All other components within normal limits  CBC - Abnormal; Notable for the following components:   RBC 3.69 (*)    Hemoglobin 12.6 (*)    HCT 37.0 (*)    MCV 100.3 (*)    MCH 34.1 (*)    Platelets 121 (*)    All other components within normal limits  HEPATIC FUNCTION PANEL - Abnormal; Notable for the following components:   Total Protein 6.3 (*)    Total Bilirubin 1.7 (*)    Bilirubin, Direct 0.3 (*)    Indirect Bilirubin 1.4 (*)    All other components within normal limits  CULTURE, BLOOD (ROUTINE X 2)  CULTURE, BLOOD (ROUTINE X 2)  SARS CORONAVIRUS 2 BY RT PCR (HOSPITAL ORDER, [place] [facility])  BRAIN NATRIURETIC PEPTIDE  TROPONIN I (HIGH SENSITIVITY)    ____________________________________________  EKG: Atrial paced rhythm, VR 62. QRS 98, QTc 462. No acute St elevations or depressions. Non-specific St changes in anterior precordial  leads. ________________________________________  RADIOLOGY All imaging, including plain films, CT scans, and ultrasounds, independently reviewed by me, and interpretations confirmed via formal radiology reads.  ED MD interpretation:   CXR: minimal bibasilar atelectasis  Official radiology report(s): DG Chest 2 View  Result Date: 03/14/2020 CLINICAL DATA:  Shortness of breath. EXAM: CHEST - 2 VIEW COMPARISON:  March 12, 2020. FINDINGS: The heart size and mediastinal contours are within normal limits. 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 Status post coronary bypass graft. No pneumothorax or pleural effusion is noted. Minimal bibasilar subsegmental atelectasis is noted. The visualized skeletal structures are unremarkable. IMPRESSION: Minimal bibasilar subsegmental atelectasis. Electronically Signed   By: Marijo Conception M.D.   On: 03/14/2020 16:22   CT Angio Chest PE W and/or Wo Contrast  Result Date: 03/14/2020 CLINICAL DATA:  Shortness of breath and cough. Leukemia. Abnormal oxygen saturation. EXAM: CT ANGIOGRAPHY CHEST WITH CONTRAST TECHNIQUE: Multidetector CT imaging of the chest was performed using the standard protocol during bolus administration of intravenous contrast. Multiplanar CT image reconstructions and MIPs were obtained to evaluate the vascular anatomy. CONTRAST:  39mL OMNIPAQUE IOHEXOL  350 MG/ML SOLN COMPARISON:  Chest radiography same day.  02/03/2020 CT FINDINGS: Cardiovascular: Pulmonary arterial opacification is good. There are no pulmonary emboli. Heart size is normal. Pacemaker in place. No pericardial fluid. Aortic atherosclerosis. Coronary artery calcification. Mediastinum/Nodes: No mediastinal or hilar mass or lymphadenopathy. Lungs/Pleura: No gross bullous emphysema. Chronic scarring in the lingula. No infiltrate, collapse or effusion. No mass or nodule. Upper Abdomen: Previous cholecystectomy.  No acute finding. Musculoskeletal: No fracture.  Chronic bridging  osteophytes. Review of the MIP images confirms the above findings. IMPRESSION: 1. No pulmonary emboli or other acute chest pathology. 2. Aortic atherosclerosis. Coronary artery calcification. Aortic Atherosclerosis (ICD10-I70.0). Electronically Signed   By: Nelson Chimes M.D.   On: 03/14/2020 18:09    ____________________________________________  PROCEDURES   Procedure(s) performed (including Critical Care):  .1-3 Lead EKG Interpretation Performed by: Duffy Bruce, MD Authorized by: Duffy Bruce, MD     Interpretation: normal     ECG rate:  60-70s   ECG rate assessment: normal     Rhythm: sinus rhythm     Ectopy: none     Conduction: normal   Comments:     Indication: SOB    ____________________________________________  INITIAL IMPRESSION / MDM / ASSESSMENT AND PLAN / ED COURSE  As part of my medical decision making, I reviewed the following data within the [place] notes reviewed and incorporated, Old chart reviewed, Notes from prior ED visits, and [place] was evaluated in Emergency Department on 03/14/2020 for the symptoms described in the history of present illness. He was evaluated in the context of the global COVID-19 pandemic, which necessitated consideration that the patient might be at risk for infection with the SARS-CoV-2 virus that causes COVID-19. Institutional protocols and algorithms that pertain to the evaluation of patients at risk for COVID-19 are in a state of rapid change based on information released by regulatory bodies including the CDC and federal and state organizations. These policies and algorithms were followed during the patient's care in the ED.  Some ED evaluations and interventions may be delayed as a result of limited staffing during the pandemic.*     Medical Decision Making:  74 yo M here with SOB, weakness, difficulty getting around the house. Already on prednisone,  abx from his oncologist. On arrival here, pt ill appearing, fatigued, and intermittently hypoxic. Breath sounds diminished w/ slight wheezes. Given his ongoing SOB and sx c/f PNA, vs PE, CT Angio obtained and is fortunately negative for PNA. No PE. EKG nonischemic. Labs otherwise reassuring with normal WBC, no signs of sepsis. Given his intermittent hypoxia, ongoing dyspnea despite outpt treatment, will admit for COPD exacerbation.  ____________________________________________  FINAL CLINICAL IMPRESSION(S) / ED DIAGNOSES  Final diagnoses:  COPD exacerbation ([place])  Shortness of breath     MEDICATIONS GIVEN DURING THIS VISIT:  Medications  ipratropium-albuterol (DUONEB) 0.5-2.5 (3) MG/3ML nebulizer solution 3 mL (3 mLs Nebulization Given 03/14/20 1734)  0.9 %  sodium chloride infusion ( Intravenous New Bag/Given 03/14/20 1730)  iohexol (OMNIPAQUE) 350 MG/ML injection 75 mL (75 mLs Intravenous Contrast Given 03/14/20 1753)  ipratropium-albuterol (DUONEB) 0.5-2.5 (3) MG/3ML nebulizer solution 3 mL (3 mLs Nebulization Given 03/14/20 1831)  ipratropium-albuterol (DUONEB) 0.5-2.5 (3) MG/3ML nebulizer solution 3 mL (3 mLs Nebulization Given 03/14/20 1831)     ED Discharge Orders    None       Note:  This document was prepared using Dragon  voice recognition software and may include unintentional dictation errors.   Duffy Bruce, MD 03/14/20 [phone]

## 2020-03-14 NOTE — ED Notes (Signed)
Dr Ellender Hose at bedside

## 2020-03-14 NOTE — Telephone Encounter (Signed)
Patient called and left vm for nursing teams. He is covid negative from [facility]. He is not feeling any better. Extremely short of breath of breath. He is using his inhaler and taking prednisone and zpac as directed. The pharmacy would Only give him enough for 4 days of the tussinex due to being a 'scheduled' drug. I spoke with Dr. Rogue Bussing and Sonia Baller NP, who advised for a virtual visit today with NP. Contacted patient - offered patient [facility] mychart visit. He agreed to do a virtual visit at 3:00 pm. Instructions provided for mychart visit. Patient stated that he went "to take the dog out" a few mins ago. He became very short of breath and had to go back inside and sit down. He took his sats - 94%-95% RA.

## 2020-03-14 NOTE — Telephone Encounter (Signed)
Dr. Rogue Bussing spoke with patient. He was extremely dyspneic. Dr. Rogue Bussing asked the patient to go to the ER. Patient asked if nurse could 911. He has his door way open for EMS to come into his house. He also asked if pt's wife could be notified of EMS transport.  911 called and wife notified.

## 2020-03-14 NOTE — Telephone Encounter (Signed)
I spoke to pt's wife to check on pt. Current in ER/awaiting CTA. Will follow- GB

## 2020-03-14 NOTE — ED Notes (Signed)
Dr Dione Plover messaged via secure chat to notify of BP 56-70'L systolic. Pt continues to be alert and oriented and have SHOB. Awaiting response

## 2020-03-15 DIAGNOSIS — B974 Respiratory syncytial virus as the cause of diseases classified elsewhere: Secondary | ICD-10-CM

## 2020-03-15 DIAGNOSIS — D7589 Other specified diseases of blood and blood-forming organs: Secondary | ICD-10-CM

## 2020-03-15 LAB — RESPIRATORY PANEL BY PCR

## 2020-03-15 LAB — BLOOD CULTURE ID PANEL (REFLEXED) - BCID2

## 2020-03-15 LAB — CBC WITH DIFFERENTIAL/PLATELET
Abs Immature Granulocytes: 0.02 10*3/uL (ref 0.00–0.07)
Basophils Absolute: 0 10*3/uL (ref 0.0–0.1)
Basophils Relative: 0 %
Eosinophils Absolute: 0 10*3/uL (ref 0.0–0.5)
Eosinophils Relative: 0 %
HCT: 32 % — ABNORMAL LOW (ref 39.0–52.0)
Hemoglobin: 10.6 g/dL — ABNORMAL LOW (ref 13.0–17.0)
Immature Granulocytes: 1 %
Lymphocytes Relative: 16 %
Lymphs Abs: 0.7 10*3/uL (ref 0.7–4.0)
MCH: 34.1 pg — ABNORMAL HIGH (ref 26.0–34.0)
MCHC: 33.1 g/dL (ref 30.0–36.0)
MCV: 102.9 fL — ABNORMAL HIGH (ref 80.0–100.0)
Monocytes Absolute: 0.3 10*3/uL (ref 0.1–1.0)
Monocytes Relative: 6 %
Neutro Abs: 3.3 10*3/uL (ref 1.7–7.7)
Neutrophils Relative %: 77 %
Platelets: 100 10*3/uL — ABNORMAL LOW (ref 150–400)
RBC: 3.11 MIL/uL — ABNORMAL LOW (ref 4.22–5.81)
RDW: 14.2 % (ref 11.5–15.5)
WBC: 4.3 10*3/uL (ref 4.0–10.5)
nRBC: 0 % (ref 0.0–0.2)

## 2020-03-15 LAB — COMPREHENSIVE METABOLIC PANEL
ALT: 32 U/L (ref 0–44)
AST: 30 U/L (ref 15–41)
Albumin: 3.5 g/dL (ref 3.5–5.0)
Alkaline Phosphatase: 102 U/L (ref 38–126)
Anion gap: 10 (ref 5–15)
BUN: 18 mg/dL (ref 8–23)
CO2: 23 mmol/L (ref 22–32)
Calcium: 8.3 mg/dL — ABNORMAL LOW (ref 8.9–10.3)
Chloride: 107 mmol/L (ref 98–111)
Creatinine, Ser: 1.02 mg/dL (ref 0.61–1.24)
GFR calc Af Amer: >60 mL/min (ref >60)
GFR calc non Af Amer: >60 mL/min (ref >60)
Glucose, Bld: 192 mg/dL — ABNORMAL HIGH (ref 70–99)
Potassium: 3.5 mmol/L (ref 3.5–5.1)
Sodium: 140 mmol/L (ref 135–145)
Total Bilirubin: 1.1 mg/dL (ref 0.3–1.2)
Total Protein: 5.8 g/dL — ABNORMAL LOW (ref 6.5–8.1)

## 2020-03-15 LAB — PROCALCITONIN: Procalcitonin: <0.1 ng/mL

## 2020-03-15 MED ORDER — SODIUM CHLORIDE 0.9 % IV SOLN
500.0000 mg | INTRAVENOUS | Status: DC
  Administered 2020-03-15 – 2020-03-17 (×3): 500 mg via INTRAVENOUS
  Filled 2020-03-15 (×4): qty 500

## 2020-03-15 MED ORDER — TRAMADOL HCL 50 MG PO TABS
50.0000 mg | ORAL_TABLET | Freq: Four times a day (QID) | ORAL | Status: DC | PRN
  Administered 2020-03-15 – 2020-03-17 (×4): 50 mg via ORAL
  Filled 2020-03-15 (×3): qty 1

## 2020-03-15 MED ORDER — MORPHINE SULFATE (PF) 2 MG/ML IV SOLN: 1.0000 mg | INTRAVENOUS | Status: DC | PRN

## 2020-03-15 MED ORDER — METHYLPREDNISOLONE SODIUM SUCC 40 MG IJ SOLR
40.0000 mg | Freq: Two times a day (BID) | INTRAMUSCULAR | Status: DC
  Administered 2020-03-15 – 2020-03-18 (×7): 40 mg via INTRAVENOUS
  Filled 2020-03-15 (×7): qty 1

## 2020-03-15 MED ORDER — IPRATROPIUM-ALBUTEROL 0.5-2.5 (3) MG/3ML IN SOLN
3.0000 mL | Freq: Three times a day (TID) | RESPIRATORY_TRACT | Status: DC
  Administered 2020-03-16 – 2020-03-18 (×7): 3 mL via RESPIRATORY_TRACT
  Filled 2020-03-15 (×7): qty 3

## 2020-03-15 NOTE — ED Notes (Signed)
Pt given hospital phone to contact family

## 2020-03-15 NOTE — Evaluation (Signed)
Physical Therapy Evaluation Patient Details Name: [name] MRN: [id number] DOB: 07/10/1946 Today's Date: 03/15/2020   History of Present Illness  74 y.o. male chronic CLL, COPD not on home oxygen, diastolic CHF, paroxysmal A. fib status post pacemaker placement, hypertension, hyperlipidemia, who presents with shortness of breath, worsening cough.  Clinical Impression  Pt did very well with PT exam and in the ED, was able to confidently and safety to get sitting, standing and walk ~225 ft with walker.  He had been on O2 on arrival, but trial on room air t/o the entire session did not lead to excessive fatigue with prolonged bout of ambulation and sats remained in the 90s (and generally the high 90s) t/o the effort.  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  Pt does not have any PT f/u needs and agrees with this assessment feeling confident about going home once he is medically ready.    Follow Up Recommendations No PT follow up    Equipment Recommendations  None recommended by PT    Recommendations for Other Services       Precautions / Restrictions Precautions Precautions: Fall Restrictions Weight Bearing Restrictions: No      Mobility  Bed Mobility Overal bed mobility: Independent             General bed mobility comments: Pt able to get to EOB with relative ease  Transfers Overall transfer level: Modified independent Equipment used: Rolling walker (2 wheeled)             General transfer comment: Pt able to rise to standing w/o issue, good confidence and safety  Ambulation/Gait Ambulation/Gait assistance: Modified independent (Device/Increase time) Gait Distance (Feet): 225 Feet Assistive device: Rolling walker (2 wheeled)       General Gait Details: Pt with some baseline limp secondary to OA, but was able to assume consistent,  safe and community appropriate cadence without excessive reliance on the walker.  Pt on room air t/o the effort and was able to keep O2 in the 90s (and generally the high 90s) with out excessively labored breathing, etc  Stairs            Wheelchair Mobility    Modified Rankin (Stroke Patients Only)       Balance Overall balance assessment: Independent                                           Pertinent Vitals/Pain Pain Assessment:  (minimal c/o chest pain with breathing)    Home Living Family/patient expects to be discharged to:: Private residence Living Arrangements: Spouse/significant other Available Help at Discharge: Family Type of Home: House Home Access: Stairs to enter Entrance Stairs-Rails: Can reach both Entrance Stairs-Number of Steps: 7 Home Layout: One level Home Equipment: Electra - 4 wheels;Cane - single point      Prior Function Level of Independence: Independent with assistive device(s)         Comments: 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  Drives his tractor for prolonged distances, uses 4WW in the home, cane outside     Hand Dominance        Extremity/Trunk Assessment   Upper Extremity Assessment Upper Extremity  Assessment: Overall WFL for tasks assessed    Lower Extremity Assessment Lower Extremity Assessment: Overall WFL for tasks assessed       Communication   Communication: No difficulties  Cognition Arousal/Alertness: Awake/alert Behavior During Therapy: WFL for tasks assessed/performed Overall Cognitive Status: Within Functional Limits for tasks assessed                                        General Comments General comments (skin integrity, edema, etc.): Pt on 1.5 L on arrival, sats >98%, on room air t/o the session (including 225 ft of ambulation) with sats remaining in the 90s t/o and only mild SOB or labored breathing    Exercises     Assessment/Plan     PT Assessment Patent does not need any further PT services  PT Problem List         PT Treatment Interventions      PT Goals (Current goals can be found in the Care Plan section)  Acute Rehab PT Goals Patient Stated Goal: go home PT Goal Formulation: All assessment and education complete, DC therapy    Frequency     Barriers to discharge        Co-evaluation               AM-PAC PT "6 Clicks" Mobility  Outcome Measure Help needed turning from your back to your side while in a flat bed without using bedrails?: None Help needed moving from lying on your back to sitting on the side of a flat bed without using bedrails?: None Help needed moving to and from a bed to a chair (including a wheelchair)?: None Help needed standing up from a chair using your arms (e.g., wheelchair or bedside chair)?: None Help needed to walk in hospital room?: None Help needed climbing 3-5 steps with a railing? : None 6 Click Score: 24    End of Session Equipment Utilized During Treatment: Gait belt Activity Tolerance: Patient tolerated treatment well Patient left: in bed;with call bell/phone within reach Nurse Communication: Mobility status (O2 sats on room air with activity) PT Visit Diagnosis: Muscle weakness (generalized) (M62.81);Difficulty in walking, not elsewhere classified (R26.2)    Time: 9357-0177 PT Time Calculation (min) (ACUTE ONLY): 22 min   Charges:   PT Evaluation $PT Eval Low Complexity: 1 Low PT Treatments $Gait Training: 8-22 mins        Kreg Shropshire, DPT 03/15/2020, 1:54 PM

## 2020-03-15 NOTE — ED Notes (Addendum)
Physical therapy stating pt tolerated RA well while ambulating. Pt remains on RA at this time. Pt sats maintained between 95-98%.

## 2020-03-15 NOTE — Progress Notes (Addendum)
PHARMACY - PHYSICIAN COMMUNICATION CRITICAL VALUE ALERT - BLOOD CULTURE IDENTIFICATION (BCID)  [name] is an 75 y.o. male who presented to [facility] on 03/14/2020 with a chief complaint of cough  Assessment:  Presents with worsening cough from suspected exacerbation of COPD. GPC in 1 of 4 bottles.    Name of physician (or Provider) Contacted: Dr Lenise Herald  Current antibiotics: levofloxacin  Changes to prescribed antibiotics recommended:  Likely contaminant - monitor off treatment  Results for orders placed or performed during the hospital encounter of 03/14/20  Blood Culture ID Panel (Reflexed) (Collected: 03/14/2020  5:06 PM)  Result Value Ref Range   Enterococcus faecalis NOT DETECTED NOT DETECTED   Enterococcus Faecium NOT DETECTED NOT DETECTED   Listeria monocytogenes NOT DETECTED NOT DETECTED   Staphylococcus species DETECTED (A) NOT DETECTED   Staphylococcus aureus (BCID) NOT DETECTED NOT DETECTED   Staphylococcus epidermidis DETECTED (A) NOT DETECTED   Staphylococcus lugdunensis NOT DETECTED NOT DETECTED   Streptococcus species NOT DETECTED NOT DETECTED   Streptococcus agalactiae NOT DETECTED NOT DETECTED   Streptococcus pneumoniae NOT DETECTED NOT DETECTED   Streptococcus pyogenes NOT DETECTED NOT DETECTED   A.calcoaceticus-baumannii NOT DETECTED NOT DETECTED   Bacteroides fragilis NOT DETECTED NOT DETECTED   Enterobacterales NOT DETECTED NOT DETECTED   Enterobacter cloacae complex NOT DETECTED NOT DETECTED   Escherichia coli NOT DETECTED NOT DETECTED   Klebsiella aerogenes NOT DETECTED NOT DETECTED   Klebsiella oxytoca NOT DETECTED NOT DETECTED   Klebsiella pneumoniae NOT DETECTED NOT DETECTED   Proteus species NOT DETECTED NOT DETECTED   Salmonella species NOT DETECTED NOT DETECTED   Serratia marcescens NOT DETECTED NOT DETECTED   Haemophilus influenzae NOT DETECTED NOT DETECTED   Neisseria meningitidis NOT DETECTED NOT DETECTED   Pseudomonas aeruginosa NOT  DETECTED NOT DETECTED   Stenotrophomonas maltophilia NOT DETECTED NOT DETECTED   Candida albicans NOT DETECTED NOT DETECTED   Candida auris NOT DETECTED NOT DETECTED   Candida glabrata NOT DETECTED NOT DETECTED   Candida krusei NOT DETECTED NOT DETECTED   Candida parapsilosis NOT DETECTED NOT DETECTED   Candida tropicalis NOT DETECTED NOT DETECTED   Cryptococcus neoformans/gattii NOT DETECTED NOT DETECTED   Methicillin resistance mecA/C DETECTED (A) NOT DETECTED    Doreene Eland, PharmD, BCPS.   Work Cell: [phone] 03/15/2020 2:36 PM

## 2020-03-15 NOTE — ED Notes (Signed)
Pt transitioned to hospital bed at this time.

## 2020-03-15 NOTE — ED Notes (Signed)
Dr. Dione Plover to bedside. States a MAP of 60 is okay while pt is sleeping

## 2020-03-15 NOTE — ED Notes (Signed)
Physical therapy at bedside

## 2020-03-15 NOTE — ED Notes (Signed)
Pt assisted with walker to chair. Pt given meal tray at this time.

## 2020-03-15 NOTE — ED Notes (Signed)
Messaged MD about pt's worsened breathing and CP. New EKG obtained and exported

## 2020-03-15 NOTE — Progress Notes (Signed)
PROGRESS NOTE    [name]  ATF:[id number] DOB: Dec 07, 1945 DOA: 03/14/2020 PCP: Leone Haven, MD    Assessment & Plan:   Active Problems:   Hyperlipidemia   Essential hypertension   Paroxysmal atrial fibrillation (HCC)   COPD (chronic obstructive pulmonary disease) (HCC)   CLL (chronic lymphocytic leukemia) (HCC)   PTSD (post-traumatic stress disorder)   Chronic diastolic CHF (congestive heart failure) (HCC)   TIA (transient ischemic attack)   PAD (peripheral artery disease) (HCC)   OSA on CPAP   CAD (coronary artery disease)   Depression   COPD with acute exacerbation (HCC)   COPD exacerbation: continue on IV levaquin, steroids & bronchodilators. Encourage incentive spirometry. RSV is positive. Continue on supplemental oxygen and wean as tolerated  Acute hypoxic respiratory failure: secondary to RSV & COPD exacerbation. Management as stated above  Hx of shingles: continue home dose of acyclovir  A. fib: likely PAF. Continue on home dose of amiodarone, metoprolol, eliquis  HLD: continue on statin, zetia   Chronic diastolic CHF: euvolemic. Continue on home dose of metoprolol. 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 No need for a transfusion at this time   OSA: continue on CPAP qhs   DVT prophylaxis: eliquis Code Status: full  Family Communication: discussed w/ pt's daughter who was at bedside and answered her questions  Disposition Plan: depends on PT/OT recs    Consultants:      Procedures:    Antimicrobials: levaquin    Subjective: Pt c/o shortness of breath   Objective: Vitals:   03/15/20 0500 03/15/20 0600 03/15/20 0700 03/15/20 0730  BP: 108/62 (!) 101/55 (!) 110/52 (!) 120/47  Pulse: 64 65 72 63  Resp: 19 (!) 22 16 14   Temp:      SpO2: 96% 96% 97% 96%  Weight:        Intake/Output Summary (Last 24 hours) at 03/15/2020 0806 Last data filed at 03/15/2020 0015 Gross per 24  hour  Intake 1640.94 ml  Output --  Net 1640.94 ml   Filed Weights   03/14/20 1549  Weight: 98.5 kg    Examination:  General exam: Appears calm but uncomfortable  Respiratory system: course breath sounds b/l  Cardiovascular system: S1 & S2 +. No  rubs, gallops or clicks. No pedal edema. Gastrointestinal system: Abdomen is nondistended, soft and nontender. Normal bowel sounds heard. Central nervous system: Alert and oriented. Moves all 4 extremities  Psychiatry: Judgement and insight appear normal. Flat mood and affect .     Data Reviewed: I have personally reviewed following labs and imaging studies  CBC: Recent Labs  Lab 03/14/20 1542 03/15/20 0545  WBC 9.0 4.3  NEUTROABS  --  3.3  HGB 12.6* 10.6*  HCT 37.0* 32.0*  MCV 100.3* 102.9*  PLT 121* 270*   Basic Metabolic Panel: Recent Labs  Lab 03/14/20 1542 03/15/20 0545  NA 138 140  K 4.0 3.5  CL 103 107  CO2 22 23  GLUCOSE 136* 192*  BUN 15 18  CREATININE 0.99 1.02  CALCIUM 8.6* 8.3*   GFR: Estimated Creatinine Clearance: 72.3 mL/min (by C-G formula based on SCr of 1.02 mg/dL). Liver Function Tests: Recent Labs  Lab 03/14/20 1542 03/15/20 0545  AST 29 30  ALT 34 32  ALKPHOS 117 102  BILITOT 1.7* 1.1  PROT 6.3* 5.8*  ALBUMIN 4.0 3.5   No results for input(s): LIPASE, AMYLASE in the last 168  hours. No results for input(s): AMMONIA in the last 168 hours. Coagulation Profile: No results for input(s): INR, PROTIME in the last 168 hours. Cardiac Enzymes: No results for input(s): CKTOTAL, CKMB, CKMBINDEX, TROPONINI in the last 168 hours. BNP (last 3 results) No results for input(s): PROBNP in the last 8760 hours. HbA1C: No results for input(s): HGBA1C in the last 72 hours. CBG: No results for input(s): GLUCAP in the last 168 hours. Lipid Profile: No results for input(s): CHOL, HDL, LDLCALC, TRIG, CHOLHDL, LDLDIRECT in the last 72 hours. Thyroid Function Tests: No results for input(s): TSH, T4TOTAL,  FREET4, T3FREE, THYROIDAB in the last 72 hours. Anemia Panel: No results for input(s): VITAMINB12, FOLATE, FERRITIN, TIBC, IRON, RETICCTPCT in the last 72 hours. Sepsis Labs: No results for input(s): PROCALCITON, LATICACIDVEN in the last 168 hours.  Recent Results (from the past 240 hour(s))  Blood culture (routine x 2)     Status: None (Preliminary result)   Collection Time: 03/14/20  5:06 PM   Specimen: BLOOD  Result Value Ref Range Status   Specimen Description BLOOD RIGHT ANTECUBITAL  Final   Special Requests   Final    BOTTLES DRAWN AEROBIC AND ANAEROBIC Blood Culture adequate volume   Culture   Final    NO GROWTH < 12 HOURS Performed at [facility], [address] [place], [place] [address]    Report Status PENDING  Incomplete  Blood culture (routine x 2)     Status: None (Preliminary result)   Collection Time: 03/14/20  5:06 PM   Specimen: BLOOD  Result Value Ref Range Status   Specimen Description BLOOD BLOOD LEFT HAND  Final   Special Requests   Final    BOTTLES DRAWN AEROBIC AND ANAEROBIC Blood Culture adequate volume   Culture   Final    NO GROWTH < 12 HOURS Performed at [facility], [address], [place], [place] [address]    Report Status PENDING  Incomplete  SARS Coronavirus 2 by RT PCR (hospital order, performed in [place] hospital lab) Nasopharyngeal Nasopharyngeal Swab     Status: None   Collection Time: 03/14/20  5:32 PM   Specimen: Nasopharyngeal Swab  Result Value Ref Range Status   SARS Coronavirus 2 NEGATIVE NEGATIVE Final    Comment: (NOTE) SARS-CoV-2 target nucleic acids are NOT DETECTED.  The SARS-CoV-2 RNA is generally detectable in upper and lower respiratory specimens during the acute phase of infection. The lowest concentration of SARS-CoV-2 viral copies this assay can detect is 250 copies / mL. A negative result does not preclude SARS-CoV-2 infection and should not be used as the sole basis for treatment or  other patient management decisions.  A negative result may occur with improper specimen collection / handling, submission of specimen other than nasopharyngeal swab, presence of viral mutation(s) within the areas targeted by this assay, and inadequate number of viral copies (<250 copies / mL). A negative result must be combined with clinical observations, patient history, and epidemiological information.  Fact Sheet for Patients:   StrictlyIdeas.no  Fact Sheet for Healthcare Providers: BankingDealers.co.za  This test is not yet approved or  cleared by the [place] FDA and has been authorized for detection and/or diagnosis of SARS-CoV-2 by FDA under an Emergency Use Authorization (EUA).  This EUA will remain in effect (meaning this test can be used) for the duration of the COVID-19 declaration under Section 564(b)(1) of the Act, 21 U.S.C. section 360bbb-3(b)(1), unless the authorization is terminated or revoked sooner.  Performed at [facility], [address] [place], [place] [address]   Respiratory Panel by PCR     Status: Abnormal   Collection Time: 03/14/20 10:16 PM   Specimen: Nasopharyngeal Swab; Respiratory  Result Value Ref Range Status   Adenovirus NOT DETECTED NOT DETECTED Final   Coronavirus 229E NOT DETECTED NOT DETECTED Final    Comment: (NOTE) The Coronavirus on the Respiratory Panel, DOES NOT test for the novel  Coronavirus (2019 nCoV)    Coronavirus HKU1 NOT DETECTED NOT DETECTED Final   Coronavirus NL63 NOT DETECTED NOT DETECTED Final   Coronavirus OC43 NOT DETECTED NOT DETECTED Final   Metapneumovirus NOT DETECTED NOT DETECTED Final   Rhinovirus / Enterovirus NOT DETECTED NOT DETECTED Final   Influenza A NOT DETECTED NOT DETECTED Final   Influenza B NOT DETECTED NOT DETECTED Final   Parainfluenza Virus 1 NOT DETECTED NOT DETECTED Final   Parainfluenza Virus 2 NOT DETECTED NOT DETECTED Final    Parainfluenza Virus 3 NOT DETECTED NOT DETECTED Final   Parainfluenza Virus 4 NOT DETECTED NOT DETECTED Final   Respiratory Syncytial Virus DETECTED (A) NOT DETECTED Final   Bordetella pertussis NOT DETECTED NOT DETECTED Final   Chlamydophila pneumoniae NOT DETECTED NOT DETECTED Final   Mycoplasma pneumoniae NOT DETECTED NOT DETECTED Final    Comment: Performed at [facility], [place]. [address], [place], [place] [address]         Radiology Studies: DG Chest 2 View  Result Date: 03/14/2020 CLINICAL DATA:  Shortness of breath. EXAM: CHEST - 2 VIEW COMPARISON:  March 12, 2020. FINDINGS: The heart size and mediastinal contours are within normal limits. 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 Status post coronary bypass graft. No pneumothorax or pleural effusion is noted. Minimal bibasilar subsegmental atelectasis is noted. The visualized skeletal structures are unremarkable. IMPRESSION: Minimal bibasilar subsegmental atelectasis. Electronically Signed   By: Marijo Conception M.D.   On: 03/14/2020 16:22   CT Angio Chest PE W and/or Wo Contrast  Result Date: 03/14/2020 CLINICAL DATA:  Shortness of breath and cough. Leukemia. Abnormal oxygen saturation. EXAM: CT ANGIOGRAPHY CHEST WITH CONTRAST TECHNIQUE: Multidetector CT imaging of the chest was performed using the standard protocol during bolus administration of intravenous contrast. Multiplanar CT image reconstructions and MIPs were obtained to evaluate the vascular anatomy. CONTRAST:  30mL OMNIPAQUE IOHEXOL 350 MG/ML SOLN COMPARISON:  Chest radiography same day.  02/03/2020 CT FINDINGS: Cardiovascular: Pulmonary arterial opacification is good. There are no pulmonary emboli. Heart size is normal. Pacemaker in place. No pericardial fluid. Aortic atherosclerosis. Coronary artery calcification. Mediastinum/Nodes: No mediastinal or hilar mass or lymphadenopathy. Lungs/Pleura: No gross bullous emphysema. Chronic scarring in the lingula. No  infiltrate, collapse or effusion. No mass or nodule. Upper Abdomen: Previous cholecystectomy.  No acute finding. Musculoskeletal: No fracture.  Chronic bridging osteophytes. Review of the MIP images confirms the above findings. IMPRESSION: 1. No pulmonary emboli or other acute chest pathology. 2. Aortic atherosclerosis. Coronary artery calcification. Aortic Atherosclerosis (ICD10-I70.0). Electronically Signed   By: Nelson Chimes M.D.   On: 03/14/2020 18:09        Scheduled Meds: . acyclovir  400 mg Oral BID  . amiodarone  100 mg Oral BID  . apixaban  5 mg Oral BID  . atorvastatin  40 mg Oral Daily  . ezetimibe  10 mg Oral Daily  . ipratropium-albuterol  3 mL Nebulization Q6H  . metoprolol tartrate  50 mg Oral BID  . predniSONE  40 mg Oral  Q breakfast   Continuous Infusions: . levofloxacin (LEVAQUIN) IV Stopped (03/14/20 2214)     LOS: 1 day    Time spent: 33 mins     Wyvonnia Dusky, MD [facility] Pager [phone]  If 7PM-7AM, please contact night-coverage [email or web address] 03/15/2020, 8:06 AM

## 2020-03-15 NOTE — ED Notes (Signed)
PT resting. PT still using accessory muscles to breathe but appears to be breathing easier than earlier. O2 and BP stable.

## 2020-03-16 ENCOUNTER — Telehealth: Payer: Medicare HMO | Admitting: Nurse Practitioner

## 2020-03-16 LAB — VITAMIN B12: Vitamin B-12: 619 pg/mL (ref 180–914)

## 2020-03-16 LAB — CBC
HCT: 33.6 % — ABNORMAL LOW (ref 39.0–52.0)
Hemoglobin: 11.7 g/dL — ABNORMAL LOW (ref 13.0–17.0)
MCH: 34.5 pg — ABNORMAL HIGH (ref 26.0–34.0)
MCHC: 34.8 g/dL (ref 30.0–36.0)
MCV: 99.1 fL (ref 80.0–100.0)
Platelets: 109 10*3/uL — ABNORMAL LOW (ref 150–400)
RBC: 3.39 MIL/uL — ABNORMAL LOW (ref 4.22–5.81)
RDW: 14.1 % (ref 11.5–15.5)
WBC: 11.5 10*3/uL — ABNORMAL HIGH (ref 4.0–10.5)
nRBC: 0 % (ref 0.0–0.2)

## 2020-03-16 LAB — BASIC METABOLIC PANEL
Anion gap: 8 (ref 5–15)
BUN: 20 mg/dL (ref 8–23)
CO2: 27 mmol/L (ref 22–32)
Calcium: 9 mg/dL (ref 8.9–10.3)
Chloride: 107 mmol/L (ref 98–111)
Creatinine, Ser: 0.89 mg/dL (ref 0.61–1.24)
GFR calc Af Amer: >60 mL/min (ref >60)
GFR calc non Af Amer: >60 mL/min (ref >60)
Glucose, Bld: 171 mg/dL — ABNORMAL HIGH (ref 70–99)
Potassium: 4.4 mmol/L (ref 3.5–5.1)
Sodium: 142 mmol/L (ref 135–145)

## 2020-03-16 LAB — FOLATE: Folate: 12.4 ng/mL (ref >5.9)

## 2020-03-16 MED ORDER — DM-GUAIFENESIN ER 30-600 MG PO TB12
1.0000 | ORAL_TABLET | Freq: Two times a day (BID) | ORAL | Status: DC
  Administered 2020-03-16 – 2020-03-18 (×5): 1 via ORAL
  Filled 2020-03-16 (×5): qty 1

## 2020-03-16 NOTE — Progress Notes (Signed)
PROGRESS NOTE    [name]  VZD:[id number] DOB: 1945/10/16 DOA: 03/14/2020 PCP: Leone Haven, MD    Assessment & Plan:   Active Problems:   Hyperlipidemia   Essential hypertension   Paroxysmal atrial fibrillation (HCC)   COPD (chronic obstructive pulmonary disease) (HCC)   CLL (chronic lymphocytic leukemia) (HCC)   PTSD (post-traumatic stress disorder)   Chronic diastolic CHF (congestive heart failure) (HCC)   TIA (transient ischemic attack)   PAD (peripheral artery disease) (HCC)   OSA on CPAP   CAD (coronary artery disease)   Depression   COPD with acute exacerbation (HCC)   COPD exacerbation: continue on IV azithromycin, steroids & bronchodilators. Improving slowly. Encourage incentive spirometry. RSV is positive. Weaned off of supplemental oxygen   Acute hypoxic respiratory failure: secondary to RSV & COPD exacerbation. Weaned off of supplemental oxygen. Management as stated above  Leukocytosis: likely secondary to steroid use.Will continue to monitor   Hx of shingles: continue home dose of acyclovir  A. fib: likely PAF. Continue on home dose of amiodarone, metoprolol, eliquis  HLD: continue on statin, zetia   Chronic diastolic CHF: euvolemic. Continue on home dose of metoprolol. 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 H&H are stable. Platelets are trending up today    OSA: continue on CPAP qhs   DVT prophylaxis: eliquis Code Status: full  Family Communication:  Disposition Plan: likely d/c back home    Consultants:      Procedures:    Antimicrobials: azithromycin    Subjective: Pt c/o shortness of breath still but slightly improved from day prior.  Objective: Vitals:   03/15/20 1625 03/15/20 2003 03/15/20 2019 03/16/20 0544  BP: 115/67  122/67 140/77  Pulse: 66  66 63  Resp: 20  18 18   Temp: 97.7 F (36.5 C)  97.6 F (36.4 C) 97.6 F (36.4 C)  TempSrc: Oral  Oral Oral   SpO2: 96% 97% 98% 95%  Weight:       No intake or output data in the 24 hours ending 03/16/20 0733 Filed Weights   03/14/20 1549  Weight: 98.5 kg    Examination: General exam: Appears calm but uncomfortable  Respiratory system: diminished breath sounds b/l. No rales  Cardiovascular system: S1 & S2 +. No  rubs, gallops or clicks.  Gastrointestinal system: Abdomen is nondistended, soft and nontender. Hypoactive  bowel sounds heard. Central nervous system: Alert and oriented. Moves all 4 extremities  Psychiatry: Judgement and insight appear normal. Flat mood and affect      Data Reviewed: I have personally reviewed following labs and imaging studies  CBC: Recent Labs  Lab 03/14/20 1542 03/15/20 0545 03/16/20 0406  WBC 9.0 4.3 11.5*  NEUTROABS  --  3.3  --   HGB 12.6* 10.6* 11.7*  HCT 37.0* 32.0* 33.6*  MCV 100.3* 102.9* 99.1  PLT 121* 100* 295*   Basic Metabolic Panel: Recent Labs  Lab 03/14/20 1542 03/15/20 0545 03/16/20 0406  NA 138 140 142  K 4.0 3.5 4.4  CL 103 107 107  CO2 22 23 27   GLUCOSE 136* 192* 171*  BUN 15 18 20   CREATININE 0.99 1.02 0.89  CALCIUM 8.6* 8.3* 9.0   GFR: Estimated Creatinine Clearance: 82.8 mL/min (by C-G formula based on SCr of 0.89 mg/dL). Liver Function Tests: Recent Labs  Lab 03/14/20 1542 03/15/20 0545  AST 29 30  ALT 34 32  ALKPHOS 117 102  BILITOT  1.7* 1.1  PROT 6.3* 5.8*  ALBUMIN 4.0 3.5   No results for input(s): LIPASE, AMYLASE in the last 168 hours. No results for input(s): AMMONIA in the last 168 hours. Coagulation Profile: No results for input(s): INR, PROTIME in the last 168 hours. Cardiac Enzymes: No results for input(s): CKTOTAL, CKMB, CKMBINDEX, TROPONINI in the last 168 hours. BNP (last 3 results) No results for input(s): PROBNP in the last 8760 hours. HbA1C: No results for input(s): HGBA1C in the last 72 hours. CBG: No results for input(s): GLUCAP in the last 168 hours. Lipid Profile: No results  for input(s): CHOL, HDL, LDLCALC, TRIG, CHOLHDL, LDLDIRECT in the last 72 hours. Thyroid Function Tests: No results for input(s): TSH, T4TOTAL, FREET4, T3FREE, THYROIDAB in the last 72 hours. Anemia Panel: Recent Labs    03/16/20 0406  FOLATE 12.4   Sepsis Labs: Recent Labs  Lab 03/15/20 1608  PROCALCITON <0.10    Recent Results (from the past 240 hour(s))  Blood culture (routine x 2)     Status: None (Preliminary result)   Collection Time: 03/14/20  5:06 PM   Specimen: BLOOD  Result Value Ref Range Status   Specimen Description BLOOD RIGHT ANTECUBITAL  Final   Special Requests   Final    BOTTLES DRAWN AEROBIC AND ANAEROBIC Blood Culture adequate volume   Culture   Final    NO GROWTH 2 DAYS Performed at [facility], [address], [place], [place] [address]    Report Status PENDING  Incomplete  Blood culture (routine x 2)     Status: None (Preliminary result)   Collection Time: 03/14/20  5:06 PM   Specimen: BLOOD  Result Value Ref Range Status   Specimen Description BLOOD BLOOD LEFT HAND  Final   Special Requests   Final    BOTTLES DRAWN AEROBIC AND ANAEROBIC Blood Culture adequate volume   Culture  Setup Time   Final    Organism ID to follow [place] CRITICAL RESULT CALLED TO, READ BACK BY AND VERIFIED WITH: DUSTIN ZEIGLER @1352  03/15/20 [facility] Performed at [facility], [address] [place], [place] [address]    Culture GRAM POSITIVE COCCI  Final   Report Status PENDING  Incomplete  Blood Culture ID Panel (Reflexed)     Status: Abnormal   Collection Time: 03/14/20  5:06 PM  Result Value Ref Range Status   Enterococcus faecalis NOT DETECTED NOT DETECTED Final   Enterococcus Faecium NOT DETECTED NOT DETECTED Final   Listeria monocytogenes NOT DETECTED NOT DETECTED Final   Staphylococcus species DETECTED (A) NOT DETECTED Final    Comment: CRITICAL RESULT CALLED TO, READ BACK BY AND VERIFIED WITH: DUSTIN ZEIGLER AT  8101 ON 03/15/2020 [place].    Staphylococcus aureus (BCID) NOT DETECTED NOT DETECTED Final   Staphylococcus epidermidis DETECTED (A) NOT DETECTED Final    Comment: Methicillin (oxacillin) resistant coagulase negative staphylococcus. Possible blood culture contaminant (unless isolated from more than one blood culture draw or clinical case suggests pathogenicity). No antibiotic treatment is indicated for blood  culture contaminants. CRITICAL RESULT CALLED TO, READ BACK BY AND VERIFIED WITH: DUSTIN ZEIGLER AT 7510 ON 03/15/2020 [place].    Staphylococcus lugdunensis NOT DETECTED NOT DETECTED Final   Streptococcus species NOT DETECTED NOT DETECTED Final   Streptococcus agalactiae NOT DETECTED NOT DETECTED Final   Streptococcus pneumoniae NOT DETECTED NOT DETECTED Final   Streptococcus pyogenes NOT DETECTED NOT DETECTED Final   A.calcoaceticus-baumannii NOT DETECTED NOT DETECTED Final  Bacteroides fragilis NOT DETECTED NOT DETECTED Final   Enterobacterales NOT DETECTED NOT DETECTED Final   Enterobacter cloacae complex NOT DETECTED NOT DETECTED Final   Escherichia coli NOT DETECTED NOT DETECTED Final   Klebsiella aerogenes NOT DETECTED NOT DETECTED Final   Klebsiella oxytoca NOT DETECTED NOT DETECTED Final   Klebsiella pneumoniae NOT DETECTED NOT DETECTED Final   Proteus species NOT DETECTED NOT DETECTED Final   Salmonella species NOT DETECTED NOT DETECTED Final   Serratia marcescens NOT DETECTED NOT DETECTED Final   Haemophilus influenzae NOT DETECTED NOT DETECTED Final   Neisseria meningitidis NOT DETECTED NOT DETECTED Final   Pseudomonas aeruginosa NOT DETECTED NOT DETECTED Final   Stenotrophomonas maltophilia NOT DETECTED NOT DETECTED Final   Candida albicans NOT DETECTED NOT DETECTED Final   Candida auris NOT DETECTED NOT DETECTED Final   Candida glabrata NOT DETECTED NOT DETECTED Final   Candida krusei NOT DETECTED NOT DETECTED Final   Candida parapsilosis NOT DETECTED NOT DETECTED Final    Candida tropicalis NOT DETECTED NOT DETECTED Final   Cryptococcus neoformans/gattii NOT DETECTED NOT DETECTED Final   Methicillin resistance mecA/C DETECTED (A) NOT DETECTED Final    Comment: CRITICAL RESULT CALLED TO, READ BACK BY AND VERIFIED WITH: DUSTIN ZEIGLER AT 0814 ON 03/15/2020 [place]. Performed at [facility], [address] [place], [place] [address]   SARS Coronavirus 2 by RT PCR (hospital order, performed in [facility] hospital lab) Nasopharyngeal Nasopharyngeal Swab     Status: None   Collection Time: 03/14/20  5:32 PM   Specimen: Nasopharyngeal Swab  Result Value Ref Range Status   SARS Coronavirus 2 NEGATIVE NEGATIVE Final    Comment: (NOTE) SARS-CoV-2 target nucleic acids are NOT DETECTED.  The SARS-CoV-2 RNA is generally detectable in upper and lower respiratory specimens during the acute phase of infection. The lowest concentration of SARS-CoV-2 viral copies this assay can detect is 250 copies / mL. A negative result does not preclude SARS-CoV-2 infection and should not be used as the sole basis for treatment or other patient management decisions.  A negative result may occur with improper specimen collection / handling, submission of specimen other than nasopharyngeal swab, presence of viral mutation(s) within the areas targeted by this assay, and inadequate number of viral copies (<250 copies / mL). A negative result must be combined with clinical observations, patient history, and epidemiological information.  Fact Sheet for Patients:   StrictlyIdeas.no  Fact Sheet for Healthcare Providers: BankingDealers.co.za  This test is not yet approved or  cleared by the [place] FDA and has been authorized for detection and/or diagnosis of SARS-CoV-2 by FDA under an Emergency Use Authorization (EUA).  This EUA will remain in effect (meaning this test can be used) for the duration of the COVID-19 declaration  under Section 564(b)(1) of the Act, 21 U.S.C. section 360bbb-3(b)(1), unless the authorization is terminated or revoked sooner.  Performed at [facility], [address] [place], [place] [address]   Respiratory Panel by PCR     Status: Abnormal   Collection Time: 03/14/20 10:16 PM   Specimen: Nasopharyngeal Swab; Respiratory  Result Value Ref Range Status   Adenovirus NOT DETECTED NOT DETECTED Final   Coronavirus 229E NOT DETECTED NOT DETECTED Final    Comment: (NOTE) The Coronavirus on the Respiratory Panel, DOES NOT test for the novel  Coronavirus (2019 nCoV)    Coronavirus HKU1 NOT DETECTED NOT DETECTED Final   Coronavirus NL63 NOT DETECTED NOT DETECTED Final   Coronavirus OC43  NOT DETECTED NOT DETECTED Final   Metapneumovirus NOT DETECTED NOT DETECTED Final   Rhinovirus / Enterovirus NOT DETECTED NOT DETECTED Final   Influenza A NOT DETECTED NOT DETECTED Final   Influenza B NOT DETECTED NOT DETECTED Final   Parainfluenza Virus 1 NOT DETECTED NOT DETECTED Final   Parainfluenza Virus 2 NOT DETECTED NOT DETECTED Final   Parainfluenza Virus 3 NOT DETECTED NOT DETECTED Final   Parainfluenza Virus 4 NOT DETECTED NOT DETECTED Final   Respiratory Syncytial Virus DETECTED (A) NOT DETECTED Final   Bordetella pertussis NOT DETECTED NOT DETECTED Final   Chlamydophila pneumoniae NOT DETECTED NOT DETECTED Final   Mycoplasma pneumoniae NOT DETECTED NOT DETECTED Final    Comment: Performed at [facility], [place] [address], [place], [place] [address]         Radiology Studies: DG Chest 2 View  Result Date: 03/14/2020 CLINICAL DATA:  Shortness of breath. EXAM: CHEST - 2 VIEW COMPARISON:  March 12, 2020. FINDINGS: The heart size and mediastinal contours are within normal limits. 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 Status post coronary bypass graft. No pneumothorax or pleural effusion is noted. Minimal bibasilar subsegmental atelectasis is noted. The visualized  skeletal structures are unremarkable. IMPRESSION: Minimal bibasilar subsegmental atelectasis. Electronically Signed   By: Marijo Conception M.D.   On: 03/14/2020 16:22   CT Angio Chest PE W and/or Wo Contrast  Result Date: 03/14/2020 CLINICAL DATA:  Shortness of breath and cough. Leukemia. Abnormal oxygen saturation. EXAM: CT ANGIOGRAPHY CHEST WITH CONTRAST TECHNIQUE: Multidetector CT imaging of the chest was performed using the standard protocol during bolus administration of intravenous contrast. Multiplanar CT image reconstructions and MIPs were obtained to evaluate the vascular anatomy. CONTRAST:  70mL OMNIPAQUE IOHEXOL 350 MG/ML SOLN COMPARISON:  Chest radiography same day.  02/03/2020 CT FINDINGS: Cardiovascular: Pulmonary arterial opacification is good. There are no pulmonary emboli. Heart size is normal. Pacemaker in place. No pericardial fluid. Aortic atherosclerosis. Coronary artery calcification. Mediastinum/Nodes: No mediastinal or hilar mass or lymphadenopathy. Lungs/Pleura: No gross bullous emphysema. Chronic scarring in the lingula. No infiltrate, collapse or effusion. No mass or nodule. Upper Abdomen: Previous cholecystectomy.  No acute finding. Musculoskeletal: No fracture.  Chronic bridging osteophytes. Review of the MIP images confirms the above findings. IMPRESSION: 1. No pulmonary emboli or other acute chest pathology. 2. Aortic atherosclerosis. Coronary artery calcification. Aortic Atherosclerosis (ICD10-I70.0). Electronically Signed   By: Nelson Chimes M.D.   On: 03/14/2020 18:09        Scheduled Meds: . acyclovir  400 mg Oral BID  . amiodarone  100 mg Oral BID  . apixaban  5 mg Oral BID  . atorvastatin  40 mg Oral Daily  . ezetimibe  10 mg Oral Daily  . ipratropium-albuterol  3 mL Nebulization TID  . methylPREDNISolone (SOLU-MEDROL) injection  40 mg Intravenous Q12H  . metoprolol tartrate  50 mg Oral BID   Continuous Infusions: . azithromycin Stopped (03/15/20 1642)      LOS: 2 days    Time spent: 31 mins     Wyvonnia Dusky, MD [facility] Pager [phone]  If 7PM-7AM, please contact night-coverage [email or web address] 03/16/2020, 7:33 AM

## 2020-03-16 NOTE — Evaluation (Signed)
Occupational Therapy Evaluation Patient Details Name: [name] MRN: [id number] DOB: 11-06-1945 Today's Date: 03/16/2020    History of Present Illness 74 y.o. male chronic CLL, COPD not on home oxygen, diastolic CHF, paroxysmal A. fib status post pacemaker placement, hypertension, hyperlipidemia, who presents with shortness of breath, worsening cough.   Clinical Impression   Patient seen for OT evaluation.  Patient lives with wife, has supportive family.  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  Pt reports he recently needed a fence built and spent time outdoors in the heat and pushed himself and feels this is the source of his condition.  He would like to return home with his wife.  Patient presents with shortness of breath, muscle weakness and requires increased time to complete self care tasks and for functional mobility and transfers.  He would benefit from skilled OT services to maximize safety and independence in necessary daily tasks and would benefit from additional instruction on energy conservation techniques to manage daily tasks.      Follow Up Recommendations  No OT follow up    Equipment Recommendations       Recommendations for Other Services       Precautions / Restrictions Precautions Precautions: Fall Restrictions Weight Bearing Restrictions: No      Mobility Bed Mobility Overal bed mobility: Independent                Transfers Overall transfer level: Modified independent Equipment used: Rolling walker (2 wheeled)             General transfer comment: Supervision to min guard for functional mobility around room and bathroom    Balance Overall balance assessment: Independent                                         ADL either performed or assessed with clinical judgement   ADL Overall ADL's : Needs assistance/impaired Eating/Feeding: Modified independent   Grooming: Min guard    Upper Body Bathing: Set up   Lower Body Bathing: Set up Lower Body Bathing Details (indicate cue type and reason): increased time to complete task Upper Body Dressing : Modified independent   Lower Body Dressing: Modified independent   Toilet Transfer: Min guard   Toileting- Clothing Manipulation and Hygiene: Modified independent  Tx: Patient seen for lower body dressing, grooming and toileting this date with instruction on safety and energy conservation techniques.       Functional mobility during ADLs: Min guard;Rolling walker General ADL Comments: Patient with generalized weakness and requires min guard to supervision for tasks as well as increased time.  O2 sats ranged between 90-95% depending on task and demands.       Vision Baseline Vision/History: No visual deficits       Perception     Praxis      Pertinent Vitals/Pain Pain Assessment: 0-10 Pain Score: 6  Pain Location: bilateral LEs Pain Descriptors / Indicators: Aching Pain Intervention(s): Monitored during session;Repositioned;Limited activity within patient's tolerance     Hand Dominance Right   Extremity/Trunk Assessment Upper Extremity Assessment Upper Extremity Assessment: Generalized weakness   Lower Extremity Assessment Lower Extremity Assessment: Defer to PT evaluation       Communication Communication Communication: No difficulties   Cognition Arousal/Alertness: Awake/alert Behavior During Therapy: WFL for tasks assessed/performed Overall Cognitive Status: Within Functional  Limits for tasks assessed                                     General Comments       Exercises     Shoulder Instructions      Home Living Family/patient expects to be discharged to:: Private residence Living Arrangements: Spouse/significant other Available Help at Discharge: Family Type of Home: House Home Access: Stairs to enter Technical brewer of Steps: 7 Entrance Stairs-Rails: Can  reach both [place]: One level     Bathroom Shower/Tub: Walk-in Hydrologist: Handicapped height Bathroom Accessibility: Yes How Accessible: Accessible via walker Home Equipment: [place] - 4 wheels;Cane - single point          Prior Functioning/Environment Level of Independence: Independent with assistive device(s)        Comments: 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  Drives his tractor for prolonged distances, uses 4WW in the home, cane outside        OT Problem List: Decreased strength;Decreased activity tolerance;Decreased knowledge of use of DME or AE;Decreased range of motion;Pain      OT Treatment/Interventions: Self-care/ADL training;Energy conservation;Therapeutic exercise;DME and/or AE instruction;Therapeutic activities;Patient/family education    OT Goals(Current goals can be found in the care plan section) Acute Rehab OT Goals Patient Stated Goal: to go home and be independent OT Goal Formulation: With patient Time For Goal Achievement: 03/31/20 Potential to Achieve Goals: Good ADL Goals Pt Will Transfer to Toilet: with modified independence Additional ADL Goal #1: Patient will demonstrate understanding of energy conservation techniques to use with ADL and IADL tasks to manage symptoms of COPD.  OT Frequency: Min 1X/week   Barriers to D/C:            Co-evaluation              AM-PAC OT "6 Clicks" Daily Activity     Outcome Measure Help from another person eating meals?: None Help from another person taking care of personal grooming?: None Help from another person toileting, which includes using toliet, bedpan, or urinal?: A Little Help from another person bathing (including washing, rinsing, drying)?: A Little Help from another person to put on and taking off regular upper body clothing?: None Help from another person to put on and taking off regular lower body clothing?: None 6 Click Score:  22   End of Session Equipment Utilized During Treatment: Gait belt  Activity Tolerance: Patient tolerated treatment well Patient left: in bed;with call bell/phone within reach;with family/visitor present  OT Visit Diagnosis: Muscle weakness (generalized) (M62.81)                Time: 0814-4818 OT Time Calculation (min): 45 min Charges:  OT General Charges $OT Visit: 1 Visit OT Evaluation $OT Eval Low Complexity: 1 Low OT Treatments $Self Care/Home Management : 8-22 mins  Klara Stjames T Jarriel Papillion, OTR/L, CLT   Francine Hannan 03/16/2020, 1:35 PM

## 2020-03-17 LAB — BASIC METABOLIC PANEL
Anion gap: 6 (ref 5–15)
BUN: 21 mg/dL (ref 8–23)
CO2: 27 mmol/L (ref 22–32)
Calcium: 8.4 mg/dL — ABNORMAL LOW (ref 8.9–10.3)
Chloride: 106 mmol/L (ref 98–111)
Creatinine, Ser: 0.77 mg/dL (ref 0.61–1.24)
GFR calc Af Amer: >60 mL/min (ref >60)
GFR calc non Af Amer: >60 mL/min (ref >60)
Glucose, Bld: 170 mg/dL — ABNORMAL HIGH (ref 70–99)
Potassium: 4.2 mmol/L (ref 3.5–5.1)
Sodium: 139 mmol/L (ref 135–145)

## 2020-03-17 LAB — CULTURE, BLOOD (ROUTINE X 2): Special Requests: ADEQUATE

## 2020-03-17 LAB — CBC
HCT: 33.1 % — ABNORMAL LOW (ref 39.0–52.0)
Hemoglobin: 11.1 g/dL — ABNORMAL LOW (ref 13.0–17.0)
MCH: 34.2 pg — ABNORMAL HIGH (ref 26.0–34.0)
MCHC: 33.5 g/dL (ref 30.0–36.0)
MCV: 101.8 fL — ABNORMAL HIGH (ref 80.0–100.0)
Platelets: 122 10*3/uL — ABNORMAL LOW (ref 150–400)
RBC: 3.25 MIL/uL — ABNORMAL LOW (ref 4.22–5.81)
RDW: 13.9 % (ref 11.5–15.5)
WBC: 12.8 10*3/uL — ABNORMAL HIGH (ref 4.0–10.5)
nRBC: 0 % (ref 0.0–0.2)

## 2020-03-17 MED ORDER — ENSURE ENLIVE PO LIQD
237.0000 mL | Freq: Three times a day (TID) | ORAL | Status: DC
  Administered 2020-03-17: 1 via ORAL
  Administered 2020-03-17 (×2): 237 mL via ORAL

## 2020-03-17 MED ORDER — BENZONATATE 100 MG PO CAPS
200.0000 mg | ORAL_CAPSULE | Freq: Three times a day (TID) | ORAL | Status: DC
  Administered 2020-03-17 – 2020-03-18 (×4): 200 mg via ORAL
  Filled 2020-03-17 (×4): qty 2

## 2020-03-17 NOTE — Progress Notes (Signed)
PROGRESS NOTE    [name]  ZOX:[id number] DOB: 1946/02/14 DOA: 03/14/2020 PCP: Leone Haven, MD    Assessment & Plan:   Active Problems:   Hyperlipidemia   Essential hypertension   Paroxysmal atrial fibrillation (HCC)   COPD (chronic obstructive pulmonary disease) (HCC)   CLL (chronic lymphocytic leukemia) (HCC)   PTSD (post-traumatic stress disorder)   Chronic diastolic CHF (congestive heart failure) (HCC)   TIA (transient ischemic attack)   PAD (peripheral artery disease) (HCC)   OSA on CPAP   CAD (coronary artery disease)   Depression   COPD with acute exacerbation (HCC)   COPD exacerbation: continue on IV azithromycin, steroids & bronchodilators. Improving slowly but still w/ shortness of breath especially w/ exertion. Encourage incentive spirometry. RSV is positive. Weaned off of supplemental oxygen   Acute hypoxic respiratory failure: secondary to RSV & COPD exacerbation. Weaned off of supplemental oxygen. Resolved  Leukocytosis: likely secondary to steroid use.Will continue to monitor   Hx of shingles: continue home dose of acyclovir  A. fib: likely PAF. Continue on home dose of amiodarone, metoprolol, eliquis  HLD: continue on statin, zetia   Chronic diastolic CHF: euvolemic. Continue on home dose of metoprolol. 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 No need for a transfusion at this time. B12 & folate are both WNL  OSA: continue on CPAP qhs   DVT prophylaxis: eliquis Code Status: full  Family Communication:  Disposition Plan: likely d/c back home    Consultants:      Procedures:    Antimicrobials: azithromycin    Subjective: Pt c/o shortness of breath especially w/ exertion   Objective: Vitals:   03/16/20 1924 03/16/20 2004 03/16/20 2145 03/17/20 0548  BP:  (!) 128/56 (!) 150/74 130/64  Pulse: 63 68 63 62  Resp: 18 20 18 16   Temp:  97.7 F (36.5 C) 98.5 F (36.9 C)  97.7 F (36.5 C)  TempSrc:  Oral  Oral  SpO2: 95% 95% 97% 94%  Weight:   98.5 kg   Height:   5\' 8"  (1.727 m)     Intake/Output Summary (Last 24 hours) at 03/17/2020 0747 Last data filed at 03/17/2020 0539 Gross per 24 hour  Intake 733.37 ml  Output 950 ml  Net -216.63 ml   Filed Weights   03/14/20 1549 03/16/20 2145  Weight: 98.5 kg 98.5 kg    Examination:  General exam: Appears calm but uncomfortable  Respiratory system: diminished breath sounds b/l  Cardiovascular system: S1 & S2 +. No  rubs, gallops or clicks.  Gastrointestinal system: Abdomen is nondistended, soft and nontender. Hypoactive bowel sounds heard. Central nervous system: Alert and oriented. Moves all 4 extremities  Psychiatry: Judgement and insight appear normal. Flat mood and affect     Data Reviewed: I have personally reviewed following labs and imaging studies  CBC: Recent Labs  Lab 03/14/20 1542 03/15/20 0545 03/16/20 0406 03/17/20 0502  WBC 9.0 4.3 11.5* 12.8*  NEUTROABS  --  3.3  --   --   HGB 12.6* 10.6* 11.7* 11.1*  HCT 37.0* 32.0* 33.6* 33.1*  MCV 100.3* 102.9* 99.1 101.8*  PLT 121* 100* 109* 811*   Basic Metabolic Panel: Recent Labs  Lab 03/14/20 1542 03/15/20 0545 03/16/20 0406 03/17/20 0502  NA 138 140 142 139  K 4.0 3.5 4.4 4.2  CL 103 107 107 106  CO2 22 23 27 27   GLUCOSE 136* 192* 171*  170*  BUN 15 18 20 21   CREATININE 0.99 1.02 0.89 0.77  CALCIUM 8.6* 8.3* 9.0 8.4*   GFR: Estimated Creatinine Clearance: 92.1 mL/min (by C-G formula based on SCr of 0.77 mg/dL). Liver Function Tests: Recent Labs  Lab 03/14/20 1542 03/15/20 0545  AST 29 30  ALT 34 32  ALKPHOS 117 102  BILITOT 1.7* 1.1  PROT 6.3* 5.8*  ALBUMIN 4.0 3.5   No results for input(s): LIPASE, AMYLASE in the last 168 hours. No results for input(s): AMMONIA in the last 168 hours. Coagulation Profile: No results for input(s): INR, PROTIME in the last 168 hours. Cardiac Enzymes: No results for input(s):  CKTOTAL, CKMB, CKMBINDEX, TROPONINI in the last 168 hours. BNP (last 3 results) No results for input(s): PROBNP in the last 8760 hours. HbA1C: No results for input(s): HGBA1C in the last 72 hours. CBG: No results for input(s): GLUCAP in the last 168 hours. Lipid Profile: No results for input(s): CHOL, HDL, LDLCALC, TRIG, CHOLHDL, LDLDIRECT in the last 72 hours. Thyroid Function Tests: No results for input(s): TSH, T4TOTAL, FREET4, T3FREE, THYROIDAB in the last 72 hours. Anemia Panel: Recent Labs    03/16/20 0406  VITAMINB12 619  FOLATE 12.4   Sepsis Labs: Recent Labs  Lab 03/15/20 [place] <0.10    Recent Results (from the past 240 hour(s))  Blood culture (routine x 2)     Status: None (Preliminary result)   Collection Time: 03/14/20  5:06 PM   Specimen: BLOOD  Result Value Ref Range Status   Specimen Description BLOOD RIGHT ANTECUBITAL  Final   Special Requests   Final    BOTTLES DRAWN AEROBIC AND ANAEROBIC Blood Culture adequate volume   Culture   Final    NO GROWTH 2 DAYS Performed at [facility], [address], [place], [place] [address]    Report Status PENDING  Incomplete  Blood culture (routine x 2)     Status: Abnormal (Preliminary result)   Collection Time: 03/14/20  5:06 PM   Specimen: BLOOD  Result Value Ref Range Status   Specimen Description   Final    BLOOD BLOOD LEFT HAND Performed at [facility], [address], [place], [place] [address]    Special Requests   Final    BOTTLES DRAWN AEROBIC AND ANAEROBIC Blood Culture adequate volume Performed at [facility], [address] [place], [place] [address]    Culture  Setup Time   Final    Organism ID to follow [place] CRITICAL RESULT CALLED TO, READ BACK BY AND VERIFIED WITH: DUSTIN ZEIGLER @1352  03/15/20 [facility] Performed at [facility], [address] [place], [place] [address]    Culture STAPHYLOCOCCUS EPIDERMIDIS  (A)  Final   Report Status PENDING  Incomplete  Blood Culture ID Panel (Reflexed)     Status: Abnormal   Collection Time: 03/14/20  5:06 PM  Result Value Ref Range Status   Enterococcus faecalis NOT DETECTED NOT DETECTED Final   Enterococcus Faecium NOT DETECTED NOT DETECTED Final   Listeria monocytogenes NOT DETECTED NOT DETECTED Final   Staphylococcus species DETECTED (A) NOT DETECTED Final    Comment: CRITICAL RESULT CALLED TO, READ BACK BY AND VERIFIED WITH: DUSTIN ZEIGLER AT 1352 ON 03/15/2020 [place].    Staphylococcus aureus (BCID) NOT DETECTED NOT DETECTED Final   Staphylococcus epidermidis DETECTED (A) NOT DETECTED Final    Comment: Methicillin (oxacillin) resistant coagulase negative staphylococcus. Possible blood culture contaminant (unless isolated  from more than one blood culture draw or clinical case suggests pathogenicity). No antibiotic treatment is indicated for blood  culture contaminants. CRITICAL RESULT CALLED TO, READ BACK BY AND VERIFIED WITH: DUSTIN ZEIGLER AT 1937 ON 03/15/2020 [place].    Staphylococcus lugdunensis NOT DETECTED NOT DETECTED Final   Streptococcus species NOT DETECTED NOT DETECTED Final   Streptococcus agalactiae NOT DETECTED NOT DETECTED Final   Streptococcus pneumoniae NOT DETECTED NOT DETECTED Final   Streptococcus pyogenes NOT DETECTED NOT DETECTED Final   A.calcoaceticus-baumannii NOT DETECTED NOT DETECTED Final   Bacteroides fragilis NOT DETECTED NOT DETECTED Final   Enterobacterales NOT DETECTED NOT DETECTED Final   Enterobacter cloacae complex NOT DETECTED NOT DETECTED Final   Escherichia coli NOT DETECTED NOT DETECTED Final   Klebsiella aerogenes NOT DETECTED NOT DETECTED Final   Klebsiella oxytoca NOT DETECTED NOT DETECTED Final   Klebsiella pneumoniae NOT DETECTED NOT DETECTED Final   Proteus species NOT DETECTED NOT DETECTED Final   Salmonella species NOT DETECTED NOT DETECTED Final   Serratia marcescens NOT DETECTED NOT DETECTED Final    Haemophilus influenzae NOT DETECTED NOT DETECTED Final   Neisseria meningitidis NOT DETECTED NOT DETECTED Final   Pseudomonas aeruginosa NOT DETECTED NOT DETECTED Final   Stenotrophomonas maltophilia NOT DETECTED NOT DETECTED Final   Candida albicans NOT DETECTED NOT DETECTED Final   Candida auris NOT DETECTED NOT DETECTED Final   Candida glabrata NOT DETECTED NOT DETECTED Final   Candida krusei NOT DETECTED NOT DETECTED Final   Candida parapsilosis NOT DETECTED NOT DETECTED Final   Candida tropicalis NOT DETECTED NOT DETECTED Final   Cryptococcus neoformans/gattii NOT DETECTED NOT DETECTED Final   Methicillin resistance mecA/C DETECTED (A) NOT DETECTED Final    Comment: CRITICAL RESULT CALLED TO, READ BACK BY AND VERIFIED WITH: DUSTIN ZEIGLER AT 9024 ON 03/15/2020 [place]. Performed at [facility], [address] [place], [place] [address]   SARS Coronavirus 2 by RT PCR (hospital order, performed in [facility] hospital lab) Nasopharyngeal Nasopharyngeal Swab     Status: None   Collection Time: 03/14/20  5:32 PM   Specimen: Nasopharyngeal Swab  Result Value Ref Range Status   SARS Coronavirus 2 NEGATIVE NEGATIVE Final    Comment: (NOTE) SARS-CoV-2 target nucleic acids are NOT DETECTED.  The SARS-CoV-2 RNA is generally detectable in upper and lower respiratory specimens during the acute phase of infection. The lowest concentration of SARS-CoV-2 viral copies this assay can detect is 250 copies / mL. A negative result does not preclude SARS-CoV-2 infection and should not be used as the sole basis for treatment or other patient management decisions.  A negative result may occur with improper specimen collection / handling, submission of specimen other than nasopharyngeal swab, presence of viral mutation(s) within the areas targeted by this assay, and inadequate number of viral copies (<250 copies / mL). A negative result must be combined with clinical observations, patient  history, and epidemiological information.  Fact Sheet for Patients:   StrictlyIdeas.no  Fact Sheet for Healthcare Providers: BankingDealers.co.za  This test is not yet approved or  cleared by the [place] FDA and has been authorized for detection and/or diagnosis of SARS-CoV-2 by FDA under an Emergency Use Authorization (EUA).  This EUA will remain in effect (meaning this test can be used) for the duration of the COVID-19 declaration under Section 564(b)(1) of the Act, 21 U.S.C. section 360bbb-3(b)(1), unless the authorization is terminated or revoked sooner.  Performed at [facility], [place],  [place], [place] [address]   Respiratory Panel by PCR     Status: Abnormal   Collection Time: 03/14/20 10:16 PM   Specimen: Nasopharyngeal Swab; Respiratory  Result Value Ref Range Status   Adenovirus NOT DETECTED NOT DETECTED Final   Coronavirus 229E NOT DETECTED NOT DETECTED Final    Comment: (NOTE) The Coronavirus on the Respiratory Panel, DOES NOT test for the novel  Coronavirus (2019 nCoV)    Coronavirus HKU1 NOT DETECTED NOT DETECTED Final   Coronavirus NL63 NOT DETECTED NOT DETECTED Final   Coronavirus OC43 NOT DETECTED NOT DETECTED Final   Metapneumovirus NOT DETECTED NOT DETECTED Final   Rhinovirus / Enterovirus NOT DETECTED NOT DETECTED Final   Influenza A NOT DETECTED NOT DETECTED Final   Influenza B NOT DETECTED NOT DETECTED Final   Parainfluenza Virus 1 NOT DETECTED NOT DETECTED Final   Parainfluenza Virus 2 NOT DETECTED NOT DETECTED Final   Parainfluenza Virus 3 NOT DETECTED NOT DETECTED Final   Parainfluenza Virus 4 NOT DETECTED NOT DETECTED Final   Respiratory Syncytial Virus DETECTED (A) NOT DETECTED Final   Bordetella pertussis NOT DETECTED NOT DETECTED Final   Chlamydophila pneumoniae NOT DETECTED NOT DETECTED Final   Mycoplasma pneumoniae NOT DETECTED NOT DETECTED Final    Comment: Performed at  [facility], [place]. [address], [place], [place] [address]         Radiology Studies: No results found.      Scheduled Meds: . acyclovir  400 mg Oral BID  . amiodarone  100 mg Oral BID  . apixaban  5 mg Oral BID  . atorvastatin  40 mg Oral Daily  . dextromethorphan-guaiFENesin  1 tablet Oral BID  . ezetimibe  10 mg Oral Daily  . ipratropium-albuterol  3 mL Nebulization TID  . methylPREDNISolone (SOLU-MEDROL) injection  40 mg Intravenous Q12H  . metoprolol tartrate  50 mg Oral BID   Continuous Infusions: . azithromycin Stopped (03/16/20 1440)     LOS: 3 days    Time spent: 30 mins     Wyvonnia Dusky, MD [facility] Pager [phone]  If 7PM-7AM, please contact night-coverage [email or web address] 03/17/2020, 7:47 AM

## 2020-03-18 ENCOUNTER — Telehealth: Payer: Self-pay | Admitting: Internal Medicine

## 2020-03-18 LAB — CBC
HCT: 33.2 % — ABNORMAL LOW (ref 39.0–52.0)
Hemoglobin: 11.1 g/dL — ABNORMAL LOW (ref 13.0–17.0)
MCH: 34 pg (ref 26.0–34.0)
MCHC: 33.4 g/dL (ref 30.0–36.0)
MCV: 101.8 fL — ABNORMAL HIGH (ref 80.0–100.0)
Platelets: 121 10*3/uL — ABNORMAL LOW (ref 150–400)
RBC: 3.26 MIL/uL — ABNORMAL LOW (ref 4.22–5.81)
RDW: 13.9 % (ref 11.5–15.5)
WBC: 11.2 10*3/uL — ABNORMAL HIGH (ref 4.0–10.5)
nRBC: 0.2 % (ref 0.0–0.2)

## 2020-03-18 LAB — BASIC METABOLIC PANEL
Anion gap: 10 (ref 5–15)
BUN: 22 mg/dL (ref 8–23)
CO2: 24 mmol/L (ref 22–32)
Calcium: 8.4 mg/dL — ABNORMAL LOW (ref 8.9–10.3)
Chloride: 102 mmol/L (ref 98–111)
Creatinine, Ser: 0.74 mg/dL (ref 0.61–1.24)
GFR calc Af Amer: >60 mL/min (ref >60)
GFR calc non Af Amer: >60 mL/min (ref >60)
Glucose, Bld: 164 mg/dL — ABNORMAL HIGH (ref 70–99)
Potassium: 3.9 mmol/L (ref 3.5–5.1)
Sodium: 136 mmol/L (ref 135–145)

## 2020-03-18 LAB — GLUCOSE, CAPILLARY: Glucose-Capillary: 162 mg/dL — ABNORMAL HIGH (ref 70–99)

## 2020-03-18 MED ORDER — BENZONATATE 200 MG PO CAPS: 200.0000 mg | ORAL_CAPSULE | Freq: Three times a day (TID) | ORAL | 0 refills | Status: AC | PRN

## 2020-03-18 MED ORDER — AZITHROMYCIN 250 MG PO TABS: ORAL_TABLET | ORAL | 0 refills | Status: AC

## 2020-03-18 MED ORDER — PREDNISONE 20 MG PO TABS: 40.0000 mg | ORAL_TABLET | Freq: Every day | ORAL | 0 refills | Status: AC

## 2020-03-18 NOTE — Discharge Summary (Signed)
Physician Discharge Summary  [name] HFW:[id number] DOB: 09-24-1945 DOA: 03/14/2020  PCP: Leone Haven, MD  Admit date: 03/14/2020 Discharge date: 03/18/2020  Admitted From: home Disposition: home  Recommendations for Outpatient Follow-up:  1. Follow up with PCP in 1-2 weeks 2. F/u pulmon in 1-2 weeks  Home Health: no Equipment/Devices:  Discharge Condition: stable CODE STATUS: full  Diet recommendation: Heart Healthy   Brief/Interim Summary: HPI was taken from Dr. Dione Plover: [name] is a 74 y.o. male chronic CLL, COPD not on home oxygen, diastolic CHF, paroxysmal A. fib status post pacemaker placement, hypertension, hyperlipidemia, who presents with worsening cough.  Review of chart shows he called both his PCP and his oncologist 2 days ago to report feeling unwell, cough productive of yellow sputum, and weakness.  His oncologist instructed him to hold his Venaclax, prescribed him a Z-Pak, and sent him for chest x-ray which was negative for acute pathology.  His oncologist followed up with him the following day to let him know about his clear chest x-ray and also to send him a prescription for a short prednisone burst.  His oncologist again followed up with him today where he reported to them that he had had a negative Covid test at [facility].  He recommended that he come in to be evaluated in person however given his report of extreme dyspnea he was instead directed to present to the emergency department.  On my interview patient confirms the above history.  States that he has felt really awful for the past 3 to 4 days, has had a really bad cough has been productive of sputum and that his breathing has been "terrible".  He confirms that he is taking the medications that he has been prescribed without any improvement in his symptoms.  In the emergency department initial vital signs notable for tachypnea to 23, and oxygen saturation 90% on room air.  Patient was subsequently  ambulated and desaturated into the mid 80s and was subsequently placed on 2 L nasal cannula.  Exam by ED provider notable for wheezes.  His lab work-up was notable for an unremarkable CMP, a CBC with mild anemia and thrombocytopenia at his baseline, negative Covid test, and a CTPA that was negative for PE or other acute chest pathology.  He was admitted for acute COPD exacerbation  Hospital Course from Dr. Lenna Sciara. [name] 8/26-8/29/21: Pt presented w/ worsening cough and was found to have a COPD exacerbation. Pt was treated w/ IV azithromycin, steroids, IS, & bronchodilators. Pt was also found to have RSV. Of note, pt initially required supplemental oxygen and has since been weaned off. PT/OT saw the pt but did not recommend any f/u therapy.   Discharge Diagnoses:  Active Problems:   Hyperlipidemia   Essential hypertension   Paroxysmal atrial fibrillation (HCC)   COPD (chronic obstructive pulmonary disease) (HCC)   CLL (chronic lymphocytic leukemia) (HCC)   PTSD (post-traumatic stress disorder)   Chronic diastolic CHF (congestive heart failure) (HCC)   TIA (transient ischemic attack)   PAD (peripheral artery disease) (HCC)   OSA on CPAP   CAD (coronary artery disease)   Depression   COPD with acute exacerbation (HCC) COPD exacerbation: improving. Continue on azithromycin, steroids & bronchodilators. Improving slowly but still w/ shortness of breath especially w/ exertion. Encourage incentive spirometry. RSV is positive. COVID19 neg. Weaned off of supplemental oxygen   Acute hypoxic respiratory failure: secondary to RSV & COPD exacerbation. Weaned off of supplemental oxygen. Resolved  Leukocytosis: likely  secondary to steroid use.Will continue to monitor   Hx of shingles: continue home dose of acyclovir  A. fib: likely PAF. Continue on home dose of amiodarone, metoprolol, eliquis  HLD: continue on statin, zetia   Chronic diastolic CHF: euvolemic. Continue on home dose of metoprolol.  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 No need for a transfusion at this time. B12 & folate are both WNL  OSA: continue on CPAP qhs   Discharge Instructions  Discharge Instructions    Diet - low sodium heart healthy   Complete by: As directed    Discharge instructions   Complete by: As directed    F/u PCP in 1 week. F/u pulmon in 1-2 weeks   Increase activity slowly   Complete by: As directed      Allergies as of 03/18/2020   No Known Allergies     Medication List    TAKE these medications   acetaminophen 500 MG tablet Commonly known as: TYLENOL Take 1,000 mg by mouth every 8 (eight) hours as needed for mild pain.   acyclovir 400 MG tablet Commonly known as: ZOVIRAX Take 1 tablet (400 mg total) by mouth 2 (two) times daily.   albuterol 108 (90 Base) MCG/ACT inhaler Commonly known as: VENTOLIN HFA Inhale 2 puffs into the lungs every 6 (six) hours as needed for wheezing or shortness of breath.   amiodarone 200 MG tablet Commonly known as: Pacerone Take 1/2 (one-half) tablet by mouth twice daily What changed:   how much to take  how to take this  when to take this  additional instructions   azithromycin 250 MG tablet Commonly known as: Zithromax Z-Pak Take 1 tablet daily for 2 days What changed: additional instructions   benzonatate 200 MG capsule Commonly known as: TESSALON Take 1 capsule (200 mg total) by mouth 3 (three) times daily as needed for up to 7 days for cough.   budesonide-formoterol 160-4.5 MCG/ACT inhaler Commonly known as: SYMBICORT Inhale 2 puffs into the lungs 2 (two) times daily.   cetirizine 10 MG tablet Commonly known as: ZYRTEC Take 10 mg by mouth daily as needed for allergies.   chlorpheniramine-HYDROcodone 10-8 MG/5ML Suer Commonly known as: TUSSIONEX Take 5 mLs by mouth at bedtime as needed for cough.   CoQ10 200 MG Caps Take 200 mg by mouth daily.   Eliquis 5 MG  Tabs tablet Generic drug: apixaban Take 5 mg by mouth 2 (two) times daily.   ezetimibe 10 MG tablet Commonly known as: ZETIA Take 1 tablet (10 mg total) by mouth daily.   furosemide 20 MG tablet Commonly known as: LASIX Take 1 tablet (20 mg) by mouth twice daily as needed   Krill Oil 350 MG Caps Take 350 mg by mouth as needed.   Lipitor 40 MG tablet Generic drug: atorvastatin Take 40 mg by mouth daily. Start back slowly with 20 mg working up on slow titration to 40mg  per Dr Rockey Situ.   metoprolol tartrate 50 MG tablet Commonly known as: LOPRESSOR Take 1 tablet (50 mg total) by mouth 2 (two) times daily.   mirtazapine 15 MG tablet Commonly known as: REMERON Take 15 mg by mouth at bedtime as needed (for panic associated with PTSD).   multivitamin with minerals Tabs tablet Take 1 tablet by mouth daily.   nitroGLYCERIN 0.4 MG SL tablet Commonly known as: NITROSTAT Place 1 tablet (0.4 mg total) under the tongue every 5 (five)  minutes as needed for chest pain.   predniSONE 20 MG tablet Commonly known as: Deltasone Take 2 tablets (40 mg total) by mouth daily for 5 days. What changed:   how much to take  how to take this  when to take this  additional instructions   traMADol 50 MG tablet Commonly known as: ULTRAM Take 1 tablet (50 mg total) by mouth every 12 (twelve) hours as needed.   Venclexta 10 MG Tabs Generic drug: venetoclax Take 1 tablet by mouth daily. What changed: Another medication with the same name was changed. Make sure you understand how and when to take each.   Venclexta 100 MG Tabs Generic drug: venetoclax Take 200 mg by mouth daily. (Take 2 tablets daily) What changed:   how much to take  additional instructions       No Known Allergies  Consultations:     Procedures/Studies: DG Chest 2 View  Result Date: 03/14/2020 CLINICAL DATA:  Shortness of breath. EXAM: CHEST - 2 VIEW COMPARISON:  March 12, 2020. FINDINGS: The heart size and  mediastinal contours are within normal limits. 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 Status post coronary bypass graft. No pneumothorax or pleural effusion is noted. Minimal bibasilar subsegmental atelectasis is noted. The visualized skeletal structures are unremarkable. IMPRESSION: Minimal bibasilar subsegmental atelectasis. Electronically Signed   By: Marijo Conception M.D.   On: 03/14/2020 16:22   DG Chest 2 View  Result Date: 03/12/2020 CLINICAL DATA:  Cough, shortness of breath EXAM: CHEST - 2 VIEW COMPARISON:  02/03/2020 FINDINGS: Left pacer remains in place, unchanged. Prior CABG. Heart is normal size. Nodular density projects over the left lung base and is stable since prior study, felt represent nipple shadow. No confluent opacities or effusions. No acute bony abnormality. IMPRESSION: No active cardiopulmonary disease. Electronically Signed   By: Rolm Baptise M.D.   On: 03/12/2020 18:23   CT SOFT TISSUE NECK W CONTRAST  Result Date: 02/20/2020 CLINICAL DATA:  Lymphoma, follow-up EXAM: CT NECK WITH CONTRAST TECHNIQUE: Multidetector CT imaging of the neck was performed using the standard protocol following the bolus administration of intravenous contrast. CONTRAST:  167mL OMNIPAQUE IOHEXOL 300 MG/ML  SOLN COMPARISON:  02/23/2019 FINDINGS: Pharynx and larynx: Stable contours. As before, prominent superior extension of thyroid cartilage indents the left posterolateral pharyngeal wall at the level of the superior epiglottis. The glottis was closed during this study. Salivary glands: Unremarkable. Thyroid: Normal. Lymph nodes: There are no enlarged or abnormal density lymph nodes. Additionally, representative lymph nodes on the prior study have significantly decreased in size. Vascular: Major neck vessels are patent. Similar plaque at the ICA origins. Left chest wall pacemaker. Limited intracranial: No abnormal enhancement. Visualized orbits: Unremarkable. Mastoids and visualized paranasal sinuses:  Mild polypoid mucosal thickening. Mastoid air cells are clear. Skeleton: Advanced degenerative changes of the cervical spine. Upper chest: No apical lung mass.  The chest is dictated separately. Other: None. IMPRESSION: Continued decrease in size of lymph nodes.  No new enlarged nodes. Electronically Signed   By: Macy Mis M.D.   On: 02/20/2020 12:28   CT Chest W Contrast  Result Date: 02/20/2020 CLINICAL DATA:  History of chronic lymphocytic leukemia for routine follow-up. No current complaints. EXAM: CT CHEST, ABDOMEN, AND PELVIS WITH CONTRAST TECHNIQUE: Multidetector CT imaging of the chest, abdomen and pelvis was performed following the standard protocol during bolus administration of intravenous contrast. CONTRAST:  162mL OMNIPAQUE IOHEXOL 300 MG/ML  SOLN COMPARISON:  Prior study 02/03/2020 and 08/29/2019. FINDINGS:  CT CHEST FINDINGS Cardiovascular: There is atherosclerosis of the aorta, great vessels and coronary arteries status post median sternotomy and CABG. Left subclavian pacemaker leads extend into the right atrium and right ventricle. Probable aortic valvular calcifications. The heart size is normal. There is no pericardial effusion. Mediastinum/Nodes: There are no enlarged mediastinal, hilar or axillary lymph nodes. The thyroid gland, trachea and esophagus demonstrate no significant findings. Lungs/Pleura: No pleural effusion or pneumothorax. Mild centrilobular and paraseptal emphysema with diffuse central airway thickening. There is chronic atelectasis at the lung bases and the lingula. No suspicious pulmonary nodule. Musculoskeletal/Chest wall: No chest wall mass or suspicious osseous findings. Stable appearance of the median sternotomy. CT ABDOMEN AND PELVIS FINDINGS Hepatobiliary: The liver is normal in density without suspicious focal abnormality. No significant biliary dilatation status post cholecystectomy. Pancreas: Unremarkable. No pancreatic ductal dilatation or surrounding  inflammatory changes. Spleen: Normal in size without focal abnormality. Adrenals/Urinary Tract: Both adrenal glands appear normal. Stable parapelvic cyst in the upper pole of the right kidney. No evidence of renal mass, urinary tract calculus or hydronephrosis. The bladder appears unremarkable. Stomach/Bowel: No evidence of bowel wall thickening, distention or surrounding inflammatory change. Mild sigmoid diverticulosis. Vascular/Lymphatic: There are no enlarged abdominal or pelvic lymph nodes. Scattered small residual retroperitoneal lymph nodes are stable. 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 This results in an aneurysm with a greatest AP dimension of 3.2 cm, unchanged from the previous study (remeasured). No acute vascular findings are seen. The portal, superior mesenteric and splenic veins are patent. Reproductive: The prostate gland is partly obscured by artifact from the total hip arthroplasties but appears grossly stable. Other: Stable postsurgical changes consistent with bilateral inguinal herniorrhaphy. No recurrent hernia or ascites. Musculoskeletal: No acute or significant osseous findings. Previous 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 IMPRESSION: 1. No evidence of recurrent adenopathy or splenomegaly. Small retroperitoneal lymph nodes are unchanged. 2. No acute findings in the chest, abdomen or pelvis. 3. 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 Recommend follow-up every 3 years. This recommendation follows ACR consensus guidelines: White Paper of the ACR Incidental Findings Committee II on Vascular Findings. J Am Coll Radiol 2013; 23:762-831. 4. Aortic Atherosclerosis (ICD10-I70.0) and Emphysema (ICD10-J43.9). Electronically Signed   By: Richardean Sale M.D.   On: 02/20/2020 14:45   CT Angio Chest PE W and/or Wo  Contrast  Result Date: 03/14/2020 CLINICAL DATA:  Shortness of breath and cough. Leukemia. Abnormal oxygen saturation. EXAM: CT ANGIOGRAPHY CHEST WITH CONTRAST TECHNIQUE: Multidetector CT imaging of the chest was performed using the standard protocol during bolus administration of intravenous contrast. Multiplanar CT image reconstructions and MIPs were obtained to evaluate the vascular anatomy. CONTRAST:  26mL OMNIPAQUE IOHEXOL 350 MG/ML SOLN COMPARISON:  Chest radiography same day.  02/03/2020 CT FINDINGS: Cardiovascular: Pulmonary arterial opacification is good. There are no pulmonary emboli. Heart size is normal. Pacemaker in place. No pericardial fluid. Aortic atherosclerosis. Coronary artery calcification. Mediastinum/Nodes: No mediastinal or hilar mass or lymphadenopathy. Lungs/Pleura: No gross bullous emphysema. Chronic scarring in the lingula. No infiltrate, collapse or effusion. No mass or nodule. Upper Abdomen: Previous cholecystectomy.  No acute finding. Musculoskeletal: No fracture.  Chronic bridging osteophytes. Review of the MIP images confirms the above findings. IMPRESSION: 1. No pulmonary emboli or other acute chest pathology. 2. Aortic atherosclerosis. Coronary artery calcification. Aortic Atherosclerosis (ICD10-I70.0). Electronically Signed   By: Jan Fireman.D.  On: 03/14/2020 18:09   CT ABDOMEN PELVIS W CONTRAST  Result Date: 02/20/2020 CLINICAL DATA:  History of chronic lymphocytic leukemia for routine follow-up. No current complaints. EXAM: CT CHEST, ABDOMEN, AND PELVIS WITH CONTRAST TECHNIQUE: Multidetector CT imaging of the chest, abdomen and pelvis was performed following the standard protocol during bolus administration of intravenous contrast. CONTRAST:  126mL OMNIPAQUE IOHEXOL 300 MG/ML  SOLN COMPARISON:  Prior study 02/03/2020 and 08/29/2019. FINDINGS: CT CHEST FINDINGS Cardiovascular: There is atherosclerosis of the aorta, great vessels and coronary arteries status post median  sternotomy and CABG. Left subclavian pacemaker leads extend into the right atrium and right ventricle. Probable aortic valvular calcifications. The heart size is normal. There is no pericardial effusion. Mediastinum/Nodes: There are no enlarged mediastinal, hilar or axillary lymph nodes. The thyroid gland, trachea and esophagus demonstrate no significant findings. Lungs/Pleura: No pleural effusion or pneumothorax. Mild centrilobular and paraseptal emphysema with diffuse central airway thickening. There is chronic atelectasis at the lung bases and the lingula. No suspicious pulmonary nodule. Musculoskeletal/Chest wall: No chest wall mass or suspicious osseous findings. Stable appearance of the median sternotomy. CT ABDOMEN AND PELVIS FINDINGS Hepatobiliary: The liver is normal in density without suspicious focal abnormality. No significant biliary dilatation status post cholecystectomy. Pancreas: Unremarkable. No pancreatic ductal dilatation or surrounding inflammatory changes. Spleen: Normal in size without focal abnormality. Adrenals/Urinary Tract: Both adrenal glands appear normal. Stable parapelvic cyst in the upper pole of the right kidney. No evidence of renal mass, urinary tract calculus or hydronephrosis. The bladder appears unremarkable. Stomach/Bowel: No evidence of bowel wall thickening, distention or surrounding inflammatory change. Mild sigmoid diverticulosis. Vascular/Lymphatic: There are no enlarged abdominal or pelvic lymph nodes. Scattered small residual retroperitoneal lymph nodes are stable. 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 This results in an aneurysm with a greatest AP dimension of 3.2 cm, unchanged from the previous study (remeasured). No acute vascular findings are seen. The portal, superior mesenteric and splenic veins are patent. Reproductive: The prostate gland is partly obscured by artifact from the  total hip arthroplasties but appears grossly stable. Other: Stable postsurgical changes consistent with bilateral inguinal herniorrhaphy. No recurrent hernia or ascites. Musculoskeletal: No acute or significant osseous findings. Previous 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 IMPRESSION: 1. No evidence of recurrent adenopathy or splenomegaly. Small retroperitoneal lymph nodes are unchanged. 2. No acute findings in the chest, abdomen or pelvis. 3. 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 Recommend follow-up every 3 years. This recommendation follows ACR consensus guidelines: White Paper of the ACR Incidental Findings Committee II on Vascular Findings. J Am Coll Radiol 2013; 91:478-295. 4. Aortic Atherosclerosis (ICD10-I70.0) and Emphysema (ICD10-J43.9). Electronically Signed   By: Richardean Sale M.D.   On: 02/20/2020 14:45   CUP PACEART REMOTE DEVICE CHECK  Result Date: 03/09/2020 Scheduled remote reviewed. Normal device function.  Next remote 91 days. JM      Subjective: Pt c/o intermittent cough still    Discharge Exam: Vitals:   03/18/20 0435 03/18/20 0800  BP: (!) 156/79   Pulse: 65   Resp: 16   Temp: 97.8 F (36.6 C)   SpO2: 96% 97%   Vitals:   03/17/20 1932 03/17/20 1943 03/18/20 0435 03/18/20 0800  BP:  130/62 (!) 156/79   Pulse: 61 62 65   Resp: 16 20 16    Temp:  97.8 F (36.6 C) 97.8 F (36.6  C)   TempSrc:  Oral Oral   SpO2: 95% 95% 96% 97%  Weight:      Height:        General: Pt is alert, awake, not in acute distress Cardiovascular:  S1/S2 +, no rubs, no gallops Respiratory: decreased breath sounds b/l. No rales, rhonchi Abdominal: Soft, NT, ND, bowel sounds + Extremities: no edema, no cyanosis    The results of significant diagnostics from this hospitalization (including imaging, microbiology, ancillary and laboratory) are listed below for reference.      Microbiology: Recent Results (from the past 240 hour(s))  Blood culture (routine x 2)     Status: None (Preliminary result)   Collection Time: 03/14/20  5:06 PM   Specimen: BLOOD  Result Value Ref Range Status   Specimen Description BLOOD RIGHT ANTECUBITAL  Final   Special Requests   Final    BOTTLES DRAWN AEROBIC AND ANAEROBIC Blood Culture adequate volume   Culture   Final    NO GROWTH 4 DAYS Performed at [facility], [address] [place], [place] [address]    Report Status PENDING  Incomplete  Blood culture (routine x 2)     Status: Abnormal   Collection Time: 03/14/20  5:06 PM   Specimen: BLOOD  Result Value Ref Range Status   Specimen Description   Final    BLOOD BLOOD LEFT HAND Performed at [facility], [address], [place], [place] [address]    Special Requests   Final    BOTTLES DRAWN AEROBIC AND ANAEROBIC Blood Culture adequate volume Performed at [facility], [address] [place], [place] [address]    Culture  Setup Time   Final    Organism ID to follow [place] CRITICAL RESULT CALLED TO, READ BACK BY AND VERIFIED WITH: DUSTIN ZEIGLER @1352  03/15/20 [facility] Performed at [facility], [address] [place], [place] [address]    Culture (A)  Final    STAPHYLOCOCCUS EPIDERMIDIS THE SIGNIFICANCE OF ISOLATING THIS ORGANISM FROM A SINGLE SET OF BLOOD CULTURES WHEN MULTIPLE SETS ARE DRAWN IS UNCERTAIN. PLEASE NOTIFY THE MICROBIOLOGY DEPARTMENT WITHIN ONE WEEK IF SPECIATION AND SENSITIVITIES ARE REQUIRED. Performed at [facility], [place] [address] [place], [place] [address]    Report Status 03/17/2020 FINAL  Final  Blood Culture ID Panel (Reflexed)     Status: Abnormal   Collection Time: 03/14/20  5:06 PM  Result Value Ref Range Status   Enterococcus faecalis NOT DETECTED NOT DETECTED Final   Enterococcus Faecium NOT DETECTED NOT DETECTED Final   Listeria monocytogenes NOT  DETECTED NOT DETECTED Final   Staphylococcus species DETECTED (A) NOT DETECTED Final    Comment: CRITICAL RESULT CALLED TO, READ BACK BY AND VERIFIED WITH: DUSTIN ZEIGLER AT 0762 ON 03/15/2020 [place].    Staphylococcus aureus (BCID) NOT DETECTED NOT DETECTED Final   Staphylococcus epidermidis DETECTED (A) NOT DETECTED Final    Comment: Methicillin (oxacillin) resistant coagulase negative staphylococcus. Possible blood culture contaminant (unless isolated from more than one blood culture draw or clinical case suggests pathogenicity). No antibiotic treatment is indicated for blood  culture contaminants. CRITICAL RESULT CALLED TO, READ BACK BY AND VERIFIED WITH: DUSTIN ZEIGLER AT 2633 ON 03/15/2020 [place].    Staphylococcus lugdunensis NOT DETECTED NOT DETECTED Final   Streptococcus species NOT DETECTED NOT DETECTED Final   Streptococcus agalactiae NOT DETECTED NOT DETECTED Final   Streptococcus pneumoniae NOT DETECTED NOT DETECTED Final   Streptococcus pyogenes  NOT DETECTED NOT DETECTED Final   A.calcoaceticus-baumannii NOT DETECTED NOT DETECTED Final   Bacteroides fragilis NOT DETECTED NOT DETECTED Final   Enterobacterales NOT DETECTED NOT DETECTED Final   Enterobacter cloacae complex NOT DETECTED NOT DETECTED Final   Escherichia coli NOT DETECTED NOT DETECTED Final   Klebsiella aerogenes NOT DETECTED NOT DETECTED Final   Klebsiella oxytoca NOT DETECTED NOT DETECTED Final   Klebsiella pneumoniae NOT DETECTED NOT DETECTED Final   Proteus species NOT DETECTED NOT DETECTED Final   Salmonella species NOT DETECTED NOT DETECTED Final   Serratia marcescens NOT DETECTED NOT DETECTED Final   Haemophilus influenzae NOT DETECTED NOT DETECTED Final   Neisseria meningitidis NOT DETECTED NOT DETECTED Final   Pseudomonas aeruginosa NOT DETECTED NOT DETECTED Final   Stenotrophomonas maltophilia NOT DETECTED NOT DETECTED Final   Candida albicans NOT DETECTED NOT DETECTED Final   Candida auris NOT DETECTED NOT  DETECTED Final   Candida glabrata NOT DETECTED NOT DETECTED Final   Candida krusei NOT DETECTED NOT DETECTED Final   Candida parapsilosis NOT DETECTED NOT DETECTED Final   Candida tropicalis NOT DETECTED NOT DETECTED Final   Cryptococcus neoformans/gattii NOT DETECTED NOT DETECTED Final   Methicillin resistance mecA/C DETECTED (A) NOT DETECTED Final    Comment: CRITICAL RESULT CALLED TO, READ BACK BY AND VERIFIED WITH: DUSTIN ZEIGLER AT 7564 ON 03/15/2020 [place]. Performed at [facility], [address] [place], [place] [address]   SARS Coronavirus 2 by RT PCR (hospital order, performed in [facility] hospital lab) Nasopharyngeal Nasopharyngeal Swab     Status: None   Collection Time: 03/14/20  5:32 PM   Specimen: Nasopharyngeal Swab  Result Value Ref Range Status   SARS Coronavirus 2 NEGATIVE NEGATIVE Final    Comment: (NOTE) SARS-CoV-2 target nucleic acids are NOT DETECTED.  The SARS-CoV-2 RNA is generally detectable in upper and lower respiratory specimens during the acute phase of infection. The lowest concentration of SARS-CoV-2 viral copies this assay can detect is 250 copies / mL. A negative result does not preclude SARS-CoV-2 infection and should not be used as the sole basis for treatment or other patient management decisions.  A negative result may occur with improper specimen collection / handling, submission of specimen other than nasopharyngeal swab, presence of viral mutation(s) within the areas targeted by this assay, and inadequate number of viral copies (<250 copies / mL). A negative result must be combined with clinical observations, patient history, and epidemiological information.  Fact Sheet for Patients:   StrictlyIdeas.no  Fact Sheet for Healthcare Providers: BankingDealers.co.za  This test is not yet approved or  cleared by the [place] FDA and has been authorized for detection and/or diagnosis of  SARS-CoV-2 by FDA under an Emergency Use Authorization (EUA).  This EUA will remain in effect (meaning this test can be used) for the duration of the COVID-19 declaration under Section 564(b)(1) of the Act, 21 U.S.C. section 360bbb-3(b)(1), unless the authorization is terminated or revoked sooner.  Performed at [facility], [address] [place], [place] [address]   Respiratory Panel by PCR     Status: Abnormal   Collection Time: 03/14/20 10:16 PM   Specimen: Nasopharyngeal Swab; Respiratory  Result Value Ref Range Status   Adenovirus NOT DETECTED NOT DETECTED Final   Coronavirus 229E NOT DETECTED NOT DETECTED Final    Comment: (NOTE) The Coronavirus on the Respiratory Panel, DOES NOT test for the novel  Coronavirus (2019 nCoV)    Coronavirus HKU1 NOT DETECTED NOT  DETECTED Final   Coronavirus NL63 NOT DETECTED NOT DETECTED Final   Coronavirus OC43 NOT DETECTED NOT DETECTED Final   Metapneumovirus NOT DETECTED NOT DETECTED Final   Rhinovirus / Enterovirus NOT DETECTED NOT DETECTED Final   Influenza A NOT DETECTED NOT DETECTED Final   Influenza B NOT DETECTED NOT DETECTED Final   Parainfluenza Virus 1 NOT DETECTED NOT DETECTED Final   Parainfluenza Virus 2 NOT DETECTED NOT DETECTED Final   Parainfluenza Virus 3 NOT DETECTED NOT DETECTED Final   Parainfluenza Virus 4 NOT DETECTED NOT DETECTED Final   Respiratory Syncytial Virus DETECTED (A) NOT DETECTED Final   Bordetella pertussis NOT DETECTED NOT DETECTED Final   Chlamydophila pneumoniae NOT DETECTED NOT DETECTED Final   Mycoplasma pneumoniae NOT DETECTED NOT DETECTED Final    Comment: Performed at [facility], [place] [address], [place], [place] [address]     Labs: BNP (last 3 results) Recent Labs    02/03/20 1254 03/14/20 1542  BNP 111.1* 29.5   Basic Metabolic Panel: Recent Labs  Lab 03/14/20 1542 03/15/20 0545 03/16/20 0406 03/17/20 0502 03/18/20 0700  NA 138 140 142 139 136  K 4.0 3.5 4.4  4.2 3.9  CL 103 107 107 106 102  CO2 22 23 27 27 24   GLUCOSE 136* 192* 171* 170* 164*  BUN 15 18 20 21 22   CREATININE 0.99 1.02 0.89 0.77 0.74  CALCIUM 8.6* 8.3* 9.0 8.4* 8.4*   Liver Function Tests: Recent Labs  Lab 03/14/20 1542 03/15/20 0545  AST 29 30  ALT 34 32  ALKPHOS 117 102  BILITOT 1.7* 1.1  PROT 6.3* 5.8*  ALBUMIN 4.0 3.5   No results for input(s): LIPASE, AMYLASE in the last 168 hours. No results for input(s): AMMONIA in the last 168 hours. CBC: Recent Labs  Lab 03/14/20 1542 03/15/20 0545 03/16/20 0406 03/17/20 0502 03/18/20 0700  WBC 9.0 4.3 11.5* 12.8* 11.2*  NEUTROABS  --  3.3  --   --   --   HGB 12.6* 10.6* 11.7* 11.1* 11.1*  HCT 37.0* 32.0* 33.6* 33.1* 33.2*  MCV 100.3* 102.9* 99.1 101.8* 101.8*  PLT 121* 100* 109* 122* 121*   Cardiac Enzymes: No results for input(s): CKTOTAL, CKMB, CKMBINDEX, TROPONINI in the last 168 hours. BNP: Invalid input(s): POCBNP CBG: Recent Labs  Lab 03/18/20 0726  GLUCAP 162*   D-Dimer No results for input(s): DDIMER in the last 72 hours. Hgb A1c No results for input(s): HGBA1C in the last 72 hours. Lipid Profile No results for input(s): CHOL, HDL, LDLCALC, TRIG, CHOLHDL, LDLDIRECT in the last 72 hours. Thyroid function studies No results for input(s): TSH, T4TOTAL, T3FREE, THYROIDAB in the last 72 hours.  Invalid input(s): FREET3 Anemia work up Recent Labs    03/16/20 0406  VITAMINB12 619  FOLATE 12.4   Urinalysis    Component Value Date/Time   COLORURINE YELLOW (A) 09/16/2018 1252   APPEARANCEUR CLEAR (A) 09/16/2018 1252   APPEARANCEUR Clear 10/11/2014 2003   LABSPEC 1.013 09/16/2018 1252   LABSPEC 1.017 10/11/2014 2003   PHURINE 6.0 09/16/2018 [place] 09/16/2018 1252   GLUCOSEU Negative 10/11/2014 2003   HGBUR MODERATE (A) 09/16/2018 1252   BILIRUBINUR NEGATIVE 09/16/2018 1252   BILIRUBINUR Negative 10/11/2014 2003   KETONESUR NEGATIVE 09/16/2018 1252   PROTEINUR NEGATIVE  09/16/2018 1252   UROBILINOGEN 1.0 05/12/2009 1426   NITRITE NEGATIVE 09/16/2018 1252   LEUKOCYTESUR NEGATIVE 09/16/2018 1252   LEUKOCYTESUR Negative 10/11/2014 2003   Sepsis  Labs Invalid input(s): PROCALCITONIN,  WBC,  LACTICIDVEN Microbiology Recent Results (from the past 240 hour(s))  Blood culture (routine x 2)     Status: None (Preliminary result)   Collection Time: 03/14/20  5:06 PM   Specimen: BLOOD  Result Value Ref Range Status   Specimen Description BLOOD RIGHT ANTECUBITAL  Final   Special Requests   Final    BOTTLES DRAWN AEROBIC AND ANAEROBIC Blood Culture adequate volume   Culture   Final    NO GROWTH 4 DAYS Performed at [facility], [address], [place], [place] [address]    Report Status PENDING  Incomplete  Blood culture (routine x 2)     Status: Abnormal   Collection Time: 03/14/20  5:06 PM   Specimen: BLOOD  Result Value Ref Range Status   Specimen Description   Final    BLOOD BLOOD LEFT HAND Performed at [facility], [address] [place], [place] [address]    Special Requests   Final    BOTTLES DRAWN AEROBIC AND ANAEROBIC Blood Culture adequate volume Performed at [facility], [address] [place], [place] [address]    Culture  Setup Time   Final    Organism ID to follow [place] CRITICAL RESULT CALLED TO, READ BACK BY AND VERIFIED WITH: DUSTIN ZEIGLER @1352  03/15/20 [facility] Performed at [facility], [address] [place], [place] [address]    Culture (A)  Final    STAPHYLOCOCCUS EPIDERMIDIS THE SIGNIFICANCE OF ISOLATING THIS ORGANISM FROM A SINGLE SET OF BLOOD CULTURES WHEN MULTIPLE SETS ARE DRAWN IS UNCERTAIN. PLEASE NOTIFY THE MICROBIOLOGY DEPARTMENT WITHIN ONE WEEK IF SPECIATION AND SENSITIVITIES ARE REQUIRED. Performed at [facility], [place] [address], [place], [place] [address]    Report Status 03/17/2020 FINAL  Final  Blood Culture ID Panel (Reflexed)      Status: Abnormal   Collection Time: 03/14/20  5:06 PM  Result Value Ref Range Status   Enterococcus faecalis NOT DETECTED NOT DETECTED Final   Enterococcus Faecium NOT DETECTED NOT DETECTED Final   Listeria monocytogenes NOT DETECTED NOT DETECTED Final   Staphylococcus species DETECTED (A) NOT DETECTED Final    Comment: CRITICAL RESULT CALLED TO, READ BACK BY AND VERIFIED WITH: DUSTIN ZEIGLER AT 8786 ON 03/15/2020 [place].    Staphylococcus aureus (BCID) NOT DETECTED NOT DETECTED Final   Staphylococcus epidermidis DETECTED (A) NOT DETECTED Final    Comment: Methicillin (oxacillin) resistant coagulase negative staphylococcus. Possible blood culture contaminant (unless isolated from more than one blood culture draw or clinical case suggests pathogenicity). No antibiotic treatment is indicated for blood  culture contaminants. CRITICAL RESULT CALLED TO, READ BACK BY AND VERIFIED WITH: DUSTIN ZEIGLER AT 7672 ON 03/15/2020 [place].    Staphylococcus lugdunensis NOT DETECTED NOT DETECTED Final   Streptococcus species NOT DETECTED NOT DETECTED Final   Streptococcus agalactiae NOT DETECTED NOT DETECTED Final   Streptococcus pneumoniae NOT DETECTED NOT DETECTED Final   Streptococcus pyogenes NOT DETECTED NOT DETECTED Final   A.calcoaceticus-baumannii NOT DETECTED NOT DETECTED Final   Bacteroides fragilis NOT DETECTED NOT DETECTED Final   Enterobacterales NOT DETECTED NOT DETECTED Final   Enterobacter cloacae complex NOT DETECTED NOT DETECTED Final   Escherichia coli NOT DETECTED NOT DETECTED Final   Klebsiella aerogenes NOT DETECTED NOT DETECTED Final   Klebsiella oxytoca NOT DETECTED NOT DETECTED Final   Klebsiella pneumoniae NOT DETECTED NOT DETECTED Final   Proteus species NOT DETECTED NOT DETECTED Final  Salmonella species NOT DETECTED NOT DETECTED Final   Serratia marcescens NOT DETECTED NOT DETECTED Final   Haemophilus influenzae NOT DETECTED NOT DETECTED Final   Neisseria meningitidis NOT  DETECTED NOT DETECTED Final   Pseudomonas aeruginosa NOT DETECTED NOT DETECTED Final   Stenotrophomonas maltophilia NOT DETECTED NOT DETECTED Final   Candida albicans NOT DETECTED NOT DETECTED Final   Candida auris NOT DETECTED NOT DETECTED Final   Candida glabrata NOT DETECTED NOT DETECTED Final   Candida krusei NOT DETECTED NOT DETECTED Final   Candida parapsilosis NOT DETECTED NOT DETECTED Final   Candida tropicalis NOT DETECTED NOT DETECTED Final   Cryptococcus neoformans/gattii NOT DETECTED NOT DETECTED Final   Methicillin resistance mecA/C DETECTED (A) NOT DETECTED Final    Comment: CRITICAL RESULT CALLED TO, READ BACK BY AND VERIFIED WITH: DUSTIN ZEIGLER AT 0100 ON 03/15/2020 [place]. Performed at [facility], [address] [place], [place] [address]   SARS Coronavirus 2 by RT PCR (hospital order, performed in [facility] hospital lab) Nasopharyngeal Nasopharyngeal Swab     Status: None   Collection Time: 03/14/20  5:32 PM   Specimen: Nasopharyngeal Swab  Result Value Ref Range Status   SARS Coronavirus 2 NEGATIVE NEGATIVE Final    Comment: (NOTE) SARS-CoV-2 target nucleic acids are NOT DETECTED.  The SARS-CoV-2 RNA is generally detectable in upper and lower respiratory specimens during the acute phase of infection. The lowest concentration of SARS-CoV-2 viral copies this assay can detect is 250 copies / mL. A negative result does not preclude SARS-CoV-2 infection and should not be used as the sole basis for treatment or other patient management decisions.  A negative result may occur with improper specimen collection / handling, submission of specimen other than nasopharyngeal swab, presence of viral mutation(s) within the areas targeted by this assay, and inadequate number of viral copies (<250 copies / mL). A negative result must be combined with clinical observations, patient history, and epidemiological information.  Fact Sheet for Patients:    StrictlyIdeas.no  Fact Sheet for Healthcare Providers: BankingDealers.co.za  This test is not yet approved or  cleared by the [place] FDA and has been authorized for detection and/or diagnosis of SARS-CoV-2 by FDA under an Emergency Use Authorization (EUA).  This EUA will remain in effect (meaning this test can be used) for the duration of the COVID-19 declaration under Section 564(b)(1) of the Act, 21 U.S.C. section 360bbb-3(b)(1), unless the authorization is terminated or revoked sooner.  Performed at [facility], [address] [place], [place] [address]   Respiratory Panel by PCR     Status: Abnormal   Collection Time: 03/14/20 10:16 PM   Specimen: Nasopharyngeal Swab; Respiratory  Result Value Ref Range Status   Adenovirus NOT DETECTED NOT DETECTED Final   Coronavirus 229E NOT DETECTED NOT DETECTED Final    Comment: (NOTE) The Coronavirus on the Respiratory Panel, DOES NOT test for the novel  Coronavirus (2019 nCoV)    Coronavirus HKU1 NOT DETECTED NOT DETECTED Final   Coronavirus NL63 NOT DETECTED NOT DETECTED Final   Coronavirus OC43 NOT DETECTED NOT DETECTED Final   Metapneumovirus NOT DETECTED NOT DETECTED Final   Rhinovirus / Enterovirus NOT DETECTED NOT DETECTED Final   Influenza A NOT DETECTED NOT DETECTED Final   Influenza B NOT DETECTED NOT DETECTED Final   Parainfluenza Virus 1 NOT DETECTED NOT DETECTED Final   Parainfluenza Virus 2 NOT DETECTED NOT DETECTED Final   Parainfluenza Virus 3 NOT DETECTED NOT DETECTED Final  Parainfluenza Virus 4 NOT DETECTED NOT DETECTED Final   Respiratory Syncytial Virus DETECTED (A) NOT DETECTED Final   Bordetella pertussis NOT DETECTED NOT DETECTED Final   Chlamydophila pneumoniae NOT DETECTED NOT DETECTED Final   Mycoplasma pneumoniae NOT DETECTED NOT DETECTED Final    Comment: Performed at [facility], [place] [address] [place], [place] [address]      Time coordinating discharge: Over 30 minutes  SIGNED:   Wyvonnia Dusky, MD  [facility] 03/18/2020, 12:27 PM Pager   If 7PM-7AM, please contact night-coverage [email or web address]

## 2020-03-18 NOTE — Telephone Encounter (Signed)
8/27-met with the patient-clinically doing better.  Left a message for patient's wife.  Hold venetoclax for now.  Will follow up in the clinic after discharge. GB

## 2020-03-18 NOTE — Progress Notes (Signed)
Discharge order received. Patient mental status is at baseline. Vital signs stable . No signs of acute distress. Discharge instructions given. Patient verbalized understanding. No other issues noted at this time.   

## 2020-03-19 LAB — CULTURE, BLOOD (ROUTINE X 2)
Culture: NO GROWTH
Special Requests: ADEQUATE

## 2020-03-20 ENCOUNTER — Telehealth: Payer: Self-pay | Admitting: *Deleted

## 2020-03-20 ENCOUNTER — Telehealth: Payer: Self-pay

## 2020-03-20 NOTE — Telephone Encounter (Signed)
Transition Care Management Follow-up Telephone Call  Date of discharge and from where: 03/18/20 from [facility]  How have you been since you were released from the hospital? Patient states, I am doing better, but still a little weak and I am taking my medicine." 94-95 room air. Sleeping with Cpap. Continues to cough intermittently. Sputum color white and yellow. Spirometer in use every hour which helps bring up the sputum. Appetite is slowly increasing.  Drinking plenty of fluids. Denies fever, severe fatigue. Paces self with activity.   Any questions or concerns? Yes. If any morning appointments are available prior to scheduled hfu 9/15, please contact for rescheduling.   Items Reviewed:  Did the pt receive and understand the discharge instructions provided? Yes , increase activity slowly.  Medications obtained and verified? Yes   Any new allergies since your discharge? No   Dietary orders reviewed? Low sodium, heart healthy.   Do you have support at home? Yes   Functional Questionnaire: (I = Independent and D = Dependent) ADLs: i  Bathing/Dressing- i  Meal Prep- i  Eating- i  Maintaining continence- i  Transferring/Ambulation- walker/cane as needed  Managing Meds- i  Follow up appointments reviewed:   PCP Hospital f/u appt confirmed? Yes  Scheduled to see Dr. Caryl Bis on 04/04/20 @ 11:30.  [facility] f/u appt confirmed? Yes  Scheduled to see [facility] on 04/03/20 @ 1:30.  Are transportation arrangements needed? No   If their condition worsens, is the pt aware to call PCP or go to the Emergency Dept.? Yes  Was the patient provided with contact information for the PCP's office or ED? Yes  Was to pt encouraged to call back with questions or concerns? Yes

## 2020-03-20 NOTE — Telephone Encounter (Signed)
Dr. Rogue Bussing. Patient has an apt on Thursday 9/2. Please advise if you want pt to keep follow-up apt on this day.

## 2020-03-21 NOTE — Telephone Encounter (Signed)
Left vm for patient to return my phone call. 

## 2020-03-21 NOTE — Telephone Encounter (Signed)
Ok to postpone the visit with MD to next week.  [name]/[name]- please speak to pt before re-schedueling the visit.   GB

## 2020-03-22 ENCOUNTER — Inpatient Hospital Stay: Payer: Medicare HMO | Attending: Internal Medicine

## 2020-03-22 ENCOUNTER — Encounter: Payer: Self-pay | Admitting: Internal Medicine

## 2020-03-22 ENCOUNTER — Other Ambulatory Visit: Payer: Self-pay | Admitting: *Deleted

## 2020-03-22 ENCOUNTER — Other Ambulatory Visit: Payer: Self-pay

## 2020-03-22 ENCOUNTER — Inpatient Hospital Stay (HOSPITAL_BASED_OUTPATIENT_CLINIC_OR_DEPARTMENT_OTHER): Payer: Medicare HMO | Admitting: Internal Medicine

## 2020-03-22 DIAGNOSIS — Z7901 Long term (current) use of anticoagulants: Secondary | ICD-10-CM | POA: Insufficient documentation

## 2020-03-22 DIAGNOSIS — Z87891 Personal history of nicotine dependence: Secondary | ICD-10-CM | POA: Diagnosis not present

## 2020-03-22 DIAGNOSIS — C9110 Chronic lymphocytic leukemia of B-cell type not having achieved remission: Secondary | ICD-10-CM | POA: Diagnosis present

## 2020-03-22 DIAGNOSIS — M545 Low back pain: Secondary | ICD-10-CM | POA: Diagnosis not present

## 2020-03-22 DIAGNOSIS — I4891 Unspecified atrial fibrillation: Secondary | ICD-10-CM | POA: Diagnosis not present

## 2020-03-22 DIAGNOSIS — M2550 Pain in unspecified joint: Secondary | ICD-10-CM | POA: Diagnosis not present

## 2020-03-22 LAB — COMPREHENSIVE METABOLIC PANEL
ALT: 62 U/L — ABNORMAL HIGH (ref 0–44)
AST: 25 U/L (ref 15–41)
Albumin: 3.7 g/dL (ref 3.5–5.0)
Alkaline Phosphatase: 92 U/L (ref 38–126)
Anion gap: 10 (ref 5–15)
BUN: 23 mg/dL (ref 8–23)
CO2: 28 mmol/L (ref 22–32)
Calcium: 8.3 mg/dL — ABNORMAL LOW (ref 8.9–10.3)
Chloride: 104 mmol/L (ref 98–111)
Creatinine, Ser: 0.97 mg/dL (ref 0.61–1.24)
GFR calc Af Amer: >60 mL/min (ref >60)
GFR calc non Af Amer: >60 mL/min (ref >60)
Glucose, Bld: 117 mg/dL — ABNORMAL HIGH (ref 70–99)
Potassium: 4.5 mmol/L (ref 3.5–5.1)
Sodium: 142 mmol/L (ref 135–145)
Total Bilirubin: 1.2 mg/dL (ref 0.3–1.2)
Total Protein: 5.7 g/dL — ABNORMAL LOW (ref 6.5–8.1)

## 2020-03-22 LAB — CBC WITH DIFFERENTIAL/PLATELET
Abs Immature Granulocytes: 0.38 10*3/uL — ABNORMAL HIGH (ref 0.00–0.07)
Basophils Absolute: 0 10*3/uL (ref 0.0–0.1)
Basophils Relative: 0 %
Eosinophils Absolute: 0.1 10*3/uL (ref 0.0–0.5)
Eosinophils Relative: 1 %
HCT: 34.2 % — ABNORMAL LOW (ref 39.0–52.0)
Hemoglobin: 11.9 g/dL — ABNORMAL LOW (ref 13.0–17.0)
Immature Granulocytes: 3 %
Lymphocytes Relative: 13 %
Lymphs Abs: 1.6 10*3/uL (ref 0.7–4.0)
MCH: 34.4 pg — ABNORMAL HIGH (ref 26.0–34.0)
MCHC: 34.8 g/dL (ref 30.0–36.0)
MCV: 98.8 fL (ref 80.0–100.0)
Monocytes Absolute: 0.9 10*3/uL (ref 0.1–1.0)
Monocytes Relative: 7 %
Neutro Abs: 9.4 10*3/uL — ABNORMAL HIGH (ref 1.7–7.7)
Neutrophils Relative %: 76 %
Platelets: 158 10*3/uL (ref 150–400)
RBC: 3.46 MIL/uL — ABNORMAL LOW (ref 4.22–5.81)
RDW: 14.3 % (ref 11.5–15.5)
WBC: 12.4 10*3/uL — ABNORMAL HIGH (ref 4.0–10.5)
nRBC: 0 % (ref 0.0–0.2)

## 2020-03-22 MED ORDER — ACYCLOVIR 400 MG PO TABS
400.0000 mg | ORAL_TABLET | Freq: Two times a day (BID) | ORAL | 3 refills | Status: DC
Start: 2020-03-22 — End: 2020-05-21

## 2020-03-22 MED ORDER — HYDROCOD POLST-CPM POLST ER 10-8 MG/5ML PO SUER
5.0000 mL | Freq: Every evening | ORAL | 0 refills | Status: DC | PRN
Start: 2020-03-22 — End: 2020-04-12

## 2020-03-22 NOTE — Telephone Encounter (Signed)
Reviewed

## 2020-03-22 NOTE — Assessment & Plan Note (Addendum)
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  STABLE  AUG 4th 2021- CT-CR; STABLE.   # sec to recent acute bronchitis/RSV infection-HOLD Venoclax [100 mg sec to diarrhea; DI with Amio]  # Acute bronchitis/RSV- recent COPD exacerbation- recommend continue with Tussinex qhs prn.new prescription given.  Continue inhalers.    # Diarrhea-G-1; Sec to [employer]- STABLE  # A.fib-on amiodarone [drug interaction-amiodarone] sinus rhythm-on Eliquis-stable  # Muscle pain-sec to Ventoclax-continue tramadol prn; stable  DISPOSITION: # follow up in 3 weeks MD; labs- cbc/cmp/ldh; quantitative immunogolublins- Dr.B

## 2020-03-22 NOTE — Progress Notes (Signed)
[place] OFFICE PROGRESS NOTE  Patient Care Team: Leone Haven, MD as PCP - General (Family Medicine) Rockey Situ Kathlene November, MD as PCP - Cardiology (Cardiology) [name], MD as Consulting Physician (Cardiology) Cammie Sickle, MD as Medical Oncologist (Hematology and Oncology)  Cancer Staging No matching staging information was found for the patient.   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  MARCH 2017- CT N/C/A/P- NED. STOP Ritux; surveillance   # AUG 2019- CT/PET- progression/NO transformation;  # NOV 2019- Progression; started Ibrutinib 420 mg/d. STOPPED in end of feb sec to extreme fatigue/joint pains/cramps  #November 01, 2018-start ibrutinib 280 mg a day; December 20, 2018-discontinue ibrutinib secondary multiple side effects.   #January 03, 2019 start GCorcoran[July]; finished Dec 2020-; [facility] [200 mg a day; DI-amiodarone]  #Mid March 2021-venetoclax dose reduced to 100 mg [second diarrhea].     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      INTERVAL HISTORY:  Michael BRINK89y.o.  male CLL-high risk currently on venetoclax is here for follow-up.   Patient was recently admitted to hospital for COPD exacerbation/RSV infection.  Patient breathing improved with symptomatic treatment/antibiotics/inhalers.  Patient continues to have intermittent cough especially at nighttime.  Shortness of breath overall improved.  Continues to have joint pains back pain on tramadol.    \Review of Systems  Constitutional: Positive for malaise/fatigue. Negative for chills, diaphoresis and fever.  HENT: Negative for nosebleeds and sore throat.   Eyes: Negative for double vision.  Respiratory: Positive for cough and sputum production. Negative for hemoptysis, shortness of breath and wheezing.   Cardiovascular: Negative for chest pain, palpitations, orthopnea and leg swelling.  Gastrointestinal: Negative for abdominal pain, blood in stool, constipation, heartburn, melena, nausea and vomiting.  Genitourinary: Negative for dysuria, frequency and urgency.  Musculoskeletal: Positive for back pain, joint pain and myalgias.  Skin: Negative.  Negative for itching and rash.  Neurological: Negative for dizziness, tingling, focal weakness and headaches.  Psychiatric/Behavioral: Negative for depression. The patient is not nervous/anxious and does not have insomnia.       PAST MEDICAL HISTORY :  Past Medical History:  Diagnosis Date  . Anxiety   . Arthritis   . Atrial fibrillation ([place]    a. Dx 2013, recurred 02/2014, CHA2DS2VASc = 3 -->placed on Eliquis;  b. 02/2014 Echo: EF 50-55%, mid and apical anterior septum and mid and apical inf septum are abnl, mild to mod Ao sclerosis w/o AS.  .Marland KitchenCancer associated pain   . Chicken pox   . Chronic lymphocytic leukemia ([place]  a. Dx 02/2014.  Marland Kitchen CLL (chronic lymphocytic leukemia) ([place])   . Complication of anesthesia    History of  PTSD--do not touch patient when waking up from  surgery.  Marland Kitchen COPD (chronic obstructive pulmonary disease) ([place])   . Coronary artery disease    a. 04/2009 CABG x 3 (LIMA->LAD, VG->OM1, VG->PDA);  b. 09/2009 Cath: occluded VG x 2 w/ patent LIMA and L->R collats. EF 55%, mild antlat HK;  c. 10/2011 MV: EF 53%, no isch/infarct-->low risk.  Marland Kitchen Dysrhythmia    hx of a-fib  . GERD (gastroesophageal reflux disease)    occasional  . History of chemotherapy 2015-2016  . HOH (hard of hearing)    Bilateral Hearing Aids  . Hypertension   . Myocardial infarction ([place]) 2010  . OSA on CPAP    USE C-PAP  . Presence of permanent cardiac pacemaker 2017  . PTSD (post-traumatic stress disorder)   . PTSD (post-traumatic stress disorder)   . Pure hypercholesterolemia   . Rheumatic fever 1959  . 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 TIA (transient ischemic attack) 11/02/2015    PAST SURGICAL HISTORY :   Past Surgical History:  Procedure Laterality Date  . ABDOMINAL HERNIA REPAIR    . APPENDECTOMY  06/21/1985  . CARDIAC CATHETERIZATION  2010; 2011   ; Dr Fletcher Anon  . CORONARY ARTERY BYPASS GRAFT  04/2009   "CABG X3"  . EP IMPLANTABLE DEVICE N/A 03/03/2016   Procedure: Pacemaker Implant;  Surgeon: Deboraha Sprang, MD;  Location: [facility];  Service: Cardiovascular;  Laterality: N/A;  . FOREIGN BODY REMOVAL  1968   "shrapnel in my tailbone"  . INGUINAL HERNIA REPAIR Right   . INSERT / REPLACE / REMOVE PACEMAKER    . JOINT REPLACEMENT Right 2018  . LAPAROSCOPIC CHOLECYSTECTOMY    . TONSILLECTOMY AND ADENOIDECTOMY  1956  . TOTAL HIP ARTHROPLASTY Right 10/22/2016   Procedure: TOTAL HIP ARTHROPLASTY;  Surgeon: Dereck Leep, MD;  Location: [facility];  Service: Orthopedics;  Laterality: Right;  . TOTAL HIP ARTHROPLASTY Left 11/04/2017   Procedure: TOTAL HIP ARTHROPLASTY;  Surgeon: Dereck Leep, MD;  Location: [facility] OR[facility];  Service: Orthopedics;  Laterality: Left;    FAMILY HISTORY :   Family History  Problem Relation Age of Onset  .  Heart disease Mother   . Heart attack Mother   . Coronary artery disease Other        family history    SOCIAL HISTORY:   Social History   Tobacco Use  . Smoking status: Former Smoker    Packs/day: 1.00    Years: 40.00    Pack years: 40.00    Types: Cigarettes    Quit date: 07/21/2006    Years since quitting: 13.6  . Smokeless tobacco: Never Used  Vaping Use  . Vaping Use: Former  Substance Use Topics  . Alcohol use: Not Currently  . Drug use: No    ALLERGIES:  has No Known Allergies.  MEDICATIONS:  Current Outpatient Medications  Medication Sig Dispense Refill  . acetaminophen (TYLENOL) 500 MG tablet Take 1,000 mg by mouth every 8 (eight) hours as needed for mild pain.    Marland Kitchen acyclovir (ZOVIRAX) 400 MG tablet Take 1 tablet (400 mg total) by mouth 2 (two) times daily. 60 tablet 3  . albuterol (PROVENTIL HFA;VENTOLIN HFA) 108 (90 Base) MCG/ACT inhaler Inhale 2 puffs into the lungs every 6 (six) hours as needed for wheezing or shortness of breath.  1 Inhaler 11  . amiodarone (PACERONE) 200 MG tablet Take 1/2 (one-half) tablet by mouth twice daily (Patient taking differently: Take 100 mg by mouth 2 (two) times daily. ) 90 tablet 3  . apixaban (ELIQUIS) 5 MG TABS tablet Take 5 mg by mouth 2 (two) times daily.    Marland Kitchen atorvastatin (LIPITOR) 40 MG tablet Take 40 mg by mouth daily. Start back slowly with 20 mg working up on slow titration to $RemoveBefo'40mg'hjkiPwqVZQV$  per Dr Rockey Situ.    . benzonatate (TESSALON) 200 MG capsule Take 1 capsule (200 mg total) by mouth 3 (three) times daily as needed for up to 7 days for cough. 21 capsule 0  . budesonide-formoterol (SYMBICORT) 160-4.5 MCG/ACT inhaler Inhale 2 puffs into the lungs 2 (two) times daily. 1 Inhaler 11  . cetirizine (ZYRTEC) 10 MG tablet Take 10 mg by mouth daily as needed for allergies.     . chlorpheniramine-HYDROcodone (TUSSIONEX) 10-8 MG/5ML SUER Take 5 mLs by mouth at bedtime as needed for cough. 140 mL 0  . Coenzyme Q10 (COQ10) 200 MG CAPS Take 200 mg  by mouth daily.    Marland Kitchen ezetimibe (ZETIA) 10 MG tablet Take 1 tablet (10 mg total) by mouth daily. 90 tablet 3  . furosemide (LASIX) 20 MG tablet Take 1 tablet (20 mg) by mouth twice daily as needed     . Krill Oil 350 MG CAPS Take 350 mg by mouth as needed.     . metoprolol tartrate (LOPRESSOR) 50 MG tablet Take 1 tablet (50 mg total) by mouth 2 (two) times daily. 180 tablet 1  . mirtazapine (REMERON) 15 MG tablet Take 15 mg by mouth at bedtime as needed (for panic associated with PTSD).     . Multiple Vitamin (MULTIVITAMIN WITH MINERALS) TABS tablet Take 1 tablet by mouth daily.    . nitroGLYCERIN (NITROSTAT) 0.4 MG SL tablet Place 1 tablet (0.4 mg total) under the tongue every 5 (five) minutes as needed for chest pain. 25 tablet 6  . predniSONE (DELTASONE) 20 MG tablet Take 2 tablets (40 mg total) by mouth daily for 5 days. 10 tablet 0  . traMADol (ULTRAM) 50 MG tablet Take 1 tablet (50 mg total) by mouth every 12 (twelve) hours as needed. 60 tablet 0  . venetoclax (VENCLEXTA) 10 MG TABS Take 1 tablet by mouth daily.    Marland Kitchen venetoclax (VENCLEXTA) 100 MG TABS Take 200 mg by mouth daily. (Take 2 tablets daily) (Patient taking differently: Take 100 mg by mouth daily. ) 60 tablet 6  . azithromycin (ZITHROMAX Z-PAK) 250 MG tablet Take 1 tablet daily for 2 days (Patient not taking: Reported on 03/22/2020) 2 each 0   No current facility-administered medications for this visit.    PHYSICAL EXAMINATION: ECOG PERFORMANCE STATUS: 1 - Symptomatic but completely ambulatory  BP 129/78 (BP Location: Left Arm, Patient Position: Sitting, Cuff Size: Large)   Pulse 60   Temp (!) 97.3 F (36.3 C) (Oral)   Resp 20   Ht $R'5\' 8"'cy$  (1.727 m)   Wt 212 lb (96.2 kg)   SpO2 99%   BMI 32.23 kg/m   Filed Weights   03/22/20 1129  Weight: 212 lb (96.2 kg)    Physical Exam Constitutional:      Comments: Patient is alone.  HENT:     Head: Normocephalic and atraumatic.     Mouth/Throat:     Pharynx: No oropharyngeal  exudate.  Eyes:     Pupils: Pupils are equal, round, and reactive  to light.  Cardiovascular:     Rate and Rhythm: Normal rate and regular rhythm.  Pulmonary:     Effort: No respiratory distress.     Breath sounds: No wheezing.  Abdominal:     General: Bowel sounds are normal. There is no distension.     Palpations: Abdomen is soft. There is no mass.     Tenderness: There is no abdominal tenderness. There is no guarding or rebound.  Musculoskeletal:        General: No tenderness. Normal range of motion.     Cervical back: Normal range of motion and neck supple.  Lymphadenopathy:     Comments: Resolved lymph nodes in the neck underarms.  Skin:    General: Skin is warm.  Neurological:     Mental Status: He is alert and oriented to person, place, and time.  Psychiatric:        Mood and Affect: Affect normal.     LABORATORY DATA:  I have reviewed the data as listed    Component Value Date/Time   NA 142 03/22/2020 1032   NA 139 10/11/2014 1800   K 4.5 03/22/2020 1032   K 3.3 (L) 10/11/2014 1800   CL 104 03/22/2020 1032   CL 106 10/11/2014 1800   CO2 28 03/22/2020 1032   CO2 27 10/11/2014 1800   GLUCOSE 117 (H) 03/22/2020 1032   GLUCOSE 107 (H) 10/11/2014 1800   BUN 23 03/22/2020 1032   BUN 15 10/11/2014 1800   CREATININE 0.97 03/22/2020 1032   CREATININE 0.89 10/11/2014 1800   CALCIUM 8.3 (L) 03/22/2020 1032   CALCIUM 8.8 (L) 10/11/2014 1800   PROT 5.7 (L) 03/22/2020 1032   PROT 6.7 05/18/2017 1048   PROT 6.4 (L) 10/11/2014 1800   ALBUMIN 3.7 03/22/2020 1032   ALBUMIN 4.3 05/18/2017 1048   ALBUMIN 4.1 10/11/2014 1800   AST 25 03/22/2020 1032   AST 23 10/11/2014 1800   ALT 62 (H) 03/22/2020 1032   ALT 22 10/11/2014 1800   ALKPHOS 92 03/22/2020 1032   ALKPHOS 61 10/11/2014 1800   BILITOT 1.2 03/22/2020 1032   BILITOT 0.7 05/18/2017 1048   BILITOT 0.9 10/11/2014 1800   GFRNONAA >60 03/22/2020 1032   GFRNONAA >60 10/11/2014 1800   GFRAA >60 03/22/2020 1032    GFRAA >60 10/11/2014 1800    No results found for: SPEP, UPEP  Lab Results  Component Value Date   WBC 12.4 (H) 03/22/2020   NEUTROABS 9.4 (H) 03/22/2020   HGB 11.9 (L) 03/22/2020   HCT 34.2 (L) 03/22/2020   MCV 98.8 03/22/2020   PLT 158 03/22/2020      Chemistry      Component Value Date/Time   NA 142 03/22/2020 1032   NA 139 10/11/2014 1800   K 4.5 03/22/2020 1032   K 3.3 (L) 10/11/2014 1800   CL 104 03/22/2020 1032   CL 106 10/11/2014 1800   CO2 28 03/22/2020 1032   CO2 27 10/11/2014 1800   BUN 23 03/22/2020 1032   BUN 15 10/11/2014 1800   CREATININE 0.97 03/22/2020 1032   CREATININE 0.89 10/11/2014 1800      Component Value Date/Time   CALCIUM 8.3 (L) 03/22/2020 1032   CALCIUM 8.8 (L) 10/11/2014 1800   ALKPHOS 92 03/22/2020 1032   ALKPHOS 61 10/11/2014 1800   AST 25 03/22/2020 1032   AST 23 10/11/2014 1800   ALT 62 (H) 03/22/2020 1032   ALT 22 10/11/2014 1800   BILITOT 1.2  03/22/2020 1032   BILITOT 0.7 05/18/2017 1048   BILITOT 0.9 10/11/2014 1800       RADIOGRAPHIC STUDIES: I have personally reviewed the radiological images as listed and agreed with the findings in the report. No results found.   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  STABLE  AUG 4th 2021- CT-CR; STABLE.   # sec to recent acute bronchitis/RSV infection-HOLD Venoclax [100 mg sec to diarrhea; DI with Amio]  # Acute bronchitis/RSV- recent COPD exacerbation- recommend continue with Tussinex qhs prn.new prescription given.  Continue inhalers.    # Diarrhea-G-1; Sec to [employer]- STABLE  # A.fib-on amiodarone [drug interaction-amiodarone] sinus rhythm-on Eliquis-stable  # Muscle pain-sec to Ventoclax-continue tramadol prn; stable  DISPOSITION: # follow up in 3 weeks MD; labs- cbc/cmp/ldh; quantitative immunogolublins- Dr.B    No orders of  the defined types were placed in this encounter.  All questions were answered. The patient knows to call the clinic with any problems, questions or concerns.      Cammie Sickle, MD 03/22/2020 1:23 PM

## 2020-04-03 ENCOUNTER — Encounter: Payer: Self-pay | Admitting: Primary Care

## 2020-04-03 ENCOUNTER — Ambulatory Visit (INDEPENDENT_AMBULATORY_CARE_PROVIDER_SITE_OTHER): Payer: Medicare HMO | Admitting: Primary Care

## 2020-04-03 DIAGNOSIS — G4733 Obstructive sleep apnea (adult) (pediatric): Secondary | ICD-10-CM

## 2020-04-03 DIAGNOSIS — J441 Chronic obstructive pulmonary disease with (acute) exacerbation: Secondary | ICD-10-CM | POA: Diagnosis not present

## 2020-04-03 DIAGNOSIS — Z9989 Dependence on other enabling machines and devices: Secondary | ICD-10-CM | POA: Diagnosis not present

## 2020-04-03 NOTE — Progress Notes (Signed)
Reviewed and agree with assessment/plan.   Chesley Mires, MD [facility] Pulmonary/Critical Care 04/03/2020, 2:41 PM Pager:  [phone]

## 2020-04-03 NOTE — Progress Notes (Signed)
Virtual Visit via Telephone Note  I connected with [name] on 04/03/20 at  1:30 PM EDT by telephone and verified that I am speaking with the correct person using two identifiers.  Location: Patient: Home Provider: Office    I discussed the limitations, risks, security and privacy concerns of performing an evaluation and management service by telephone and the availability of in person appointments. I also discussed with the patient that there may be a patient responsible charge related to this service. The patient expressed understanding and agreed to proceed.   History of Present Illness: 74 year old male, former smoker. PMH significant for COPD, OSA. Former patient of Dr. Alva Garnet, last seen 01/18/2018. Maintained on CPAP for sleep apnea. Continue Symbicort 160 and Spiriva.   02/20/2020 Patient presents today for regular follow-up. He hasn't been seen in over 2 years. PCP concerned CPAP pressure is not high enough for him. Patient states that he sleeps pretty well at night. He is getting about 5-6 hours of sleep each night. He does not wake up frequently, reports some restlessness. Wife states that he snore without the mask. He states that his CPAP pressure is set at 4. He uses a full mask which he states rides up at night. He has tried nasal mask but was snoring. DME company [employer].   Breathing is about the same. No significant complaints today. He has had no recent COPD exacerbations or bronchitis. He is consistent with using Symbicort/Spiriva. 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 He was recently in the hospital in July for 1 night for chest pain. Troponin negative. Presentation inconsistent with cardiac origin, seen by cardiology. Denies re-currrence of chest pain.    04/03/2020 - Interim hx Patient presents today for 4-6 week follow-up visit. During last visit we confirmed CPAP pressure was changed to 8cm h20. He went sent in RX for  nasal pillow mask and chin strap. Continues on Symbicort 160 and Spiriva respimat He was admitted August 25-29 for COPD exacerbation secondary to RSV infections. He was treated with IV azithromycin, prednisone, supplemental oxygen, bronchodilators. CTA negative for PE or acute process.   He has been doing well since hospitalization and CPAP was adjusted.  Cough has resolved. His breathing is at baseline, he always has some degree of shortness of breath.    Observations/Objective:  - Sounds well; no overt shortness of breath or wheezing   Assessment and Plan:  COPD - Recently hospitalized in August 2021 for AECOPD d/t RSV treated with IV azithromycin/ steriods  - Feeling better, cough has resolved. Dyspnea is baseline. Not on oxygen.   - Continue Symbicort 160 2 puffs twice daily + Spiriva respimat  - Needs to establish with new [facility] pulmonary provider   OSA: - Patient is 87% complaint with use > 4 hours - Pressure 8cm h20 ; AHI 1.1  - No changes today   Follow Up Instructions:   3 months with Dr. Halford Chessman or Mortimer Fries in [place]   I discussed the assessment and treatment plan with the patient. The patient was provided an opportunity to ask questions and all were answered. The patient agreed with the plan and demonstrated an understanding of the instructions.   The patient was advised to call back or seek an in-person evaluation if the symptoms worsen or if the condition fails to improve as anticipated.  I provided 18 minutes of non-face-to-face time during this encounter.   Martyn Ehrich, NP

## 2020-04-03 NOTE — Patient Instructions (Signed)
No changes Continue CPAP every night for 4-6 hours more more Continue Symbicort 160 two puffs twice daily and Spiriva respimat Follow-up 3 months with [facility]

## 2020-04-04 ENCOUNTER — Encounter: Payer: Self-pay | Admitting: Family Medicine

## 2020-04-04 ENCOUNTER — Ambulatory Visit (INDEPENDENT_AMBULATORY_CARE_PROVIDER_SITE_OTHER): Payer: No Typology Code available for payment source | Admitting: Family Medicine

## 2020-04-04 ENCOUNTER — Other Ambulatory Visit: Payer: Self-pay

## 2020-04-04 VITALS — BP 124/80 | HR 66 | Temp 98.4°F | Resp 16 | Wt 215.6 lb

## 2020-04-04 DIAGNOSIS — Z23 Encounter for immunization: Secondary | ICD-10-CM | POA: Diagnosis not present

## 2020-04-04 DIAGNOSIS — L0390 Cellulitis, unspecified: Secondary | ICD-10-CM | POA: Insufficient documentation

## 2020-04-04 DIAGNOSIS — J432 Centrilobular emphysema: Secondary | ICD-10-CM

## 2020-04-04 DIAGNOSIS — C9110 Chronic lymphocytic leukemia of B-cell type not having achieved remission: Secondary | ICD-10-CM

## 2020-04-04 DIAGNOSIS — J441 Chronic obstructive pulmonary disease with (acute) exacerbation: Secondary | ICD-10-CM | POA: Diagnosis not present

## 2020-04-04 DIAGNOSIS — L03115 Cellulitis of right lower limb: Secondary | ICD-10-CM | POA: Diagnosis not present

## 2020-04-04 MED ORDER — CEPHALEXIN 500 MG PO CAPS: 500.0000 mg | ORAL_CAPSULE | Freq: Four times a day (QID) | ORAL | 0 refills | Status: DC

## 2020-04-04 NOTE — Assessment & Plan Note (Signed)
Patient is back to his baseline.  He will continue his Symbicort 2 puffs twice daily and Spiriva once daily.

## 2020-04-04 NOTE — Assessment & Plan Note (Addendum)
Findings are concerning for cellulitis given distal spread of erythema.  We will start on Keflex.  He will follow up in 2 days for recheck.  Advised to seek medical attention if he develops fevers or spreading redness.

## 2020-04-04 NOTE — Patient Instructions (Signed)
Nice to see you. We will start you on Keflex for cellulitis.  If you develop fevers or spreading redness while on this please seek medical attention.  I will see you back on Friday to recheck. If you develop any breathing issues please let us know right away. You can go ahead and get the third dose of [employer] vaccine.  Given your history of CLL you would qualify for this.

## 2020-04-04 NOTE — Progress Notes (Signed)
Michael Rumps, MD Phone: [phone]  [name] is a 74 y.o. male who presents today for f/u.  This patient was hospitalized from 03/14/2020-03/18/2020.  He was hospitalized for COPD exacerbation and RSV infection.  He presented with worsening cough and dyspnea.  He failed outpatient treatment and subsequently presented to the emergency department for evaluation.  He was started on oxygen in the hospital though was weaned off by discharge.  He had a CT angiogram that was negative for PE and other acute chest pathology.  He was treated with IV azithromycin, steroids, and bronchodilators.  He progressively improved and was discharged home.  He has progressively improved back to his baseline.  He notes chronic dyspnea that is stable at his baseline.  No cough.  Rare wheezes.  He is using Symbicort and Spiriva daily as prescribed.  1 out of 2 of his blood cultures grew out staph epidermidis which is likely a skin contaminant.  Covid testing was negative.  He followed up with pulmonology yesterday.  They recommended he continue the Symbicort and Spiriva.  Discharge summary reviewed.  Medications reviewed.  Patient additionally notes a wasp staying yesterday in his right medial calf.  The area immediately around the sting was red yesterday though he has had spreading redness distally from his proximal calf down to the midportion of his calf medially.  Social History   Tobacco Use  Smoking Status Former Smoker  . Packs/day: 1.00  . Years: 40.00  . Pack years: 40.00  . Types: Cigarettes  . Quit date: 07/21/2006  . Years since quitting: 13.7  Smokeless Tobacco Never Used     ROS see history of present illness  Objective  Physical Exam Vitals:   04/04/20 1127  BP: 124/80  Pulse: 66  Resp: 16  Temp: 98.4 F (36.9 C)  SpO2: 99%    BP Readings from Last 3 Encounters:  04/04/20 124/80  03/22/20 129/78  03/18/20 (!) 156/79   Wt Readings from Last 3 Encounters:  04/04/20 215 lb 9.6  oz (97.8 kg)  03/22/20 212 lb (96.2 kg)  03/16/20 217 lb 2.5 oz (98.5 kg)    Physical Exam Constitutional:      General: He is not in acute distress.    Appearance: He is not diaphoretic.  Cardiovascular:     Rate and Rhythm: Normal rate and regular rhythm.     Heart sounds: Normal heart sounds.  Pulmonary:     Effort: Pulmonary effort is normal.     Breath sounds: Normal breath sounds.  Musculoskeletal:     Right lower leg: No edema.     Left lower leg: No edema.  Skin:    General: Skin is warm and dry.       Neurological:     Mental Status: He is alert.      Assessment/Plan: Please see individual problem list.  COPD with acute exacerbation [facility]) Patient has recovered well.  He has back to his baseline.  He will continue Symbicort 2 puffs twice daily and Spiriva once daily.  He will seek medical attention for worsening symptoms.  Cellulitis Findings are concerning for cellulitis given distal spread of erythema.  We will start on Keflex.  He will follow up in 2 days for recheck.  Advised to seek medical attention if he develops fevers or spreading redness.  COPD (chronic obstructive pulmonary disease) ([place]) Patient is back to his baseline.  He will continue his Symbicort 2 puffs twice daily and Spiriva once daily.  CLL (chronic lymphocytic leukemia) ([place]) Has been stable.  He wonders if he can go ahead and get the booster vaccine of the Pfizer COVID-19 vaccine.  Given that he has CLL I think it would be reasonable for him to proceed with the third dose.  He was advised of this.   Orders Placed This Encounter  Procedures  . Flu Vaccine QUAD High Dose(Fluad)    Meds ordered this encounter  Medications  . cephALEXin (KEFLEX) 500 MG capsule    Sig: Take 1 capsule (500 mg total) by mouth 4 (four) times daily.    Dispense:  28 capsule    Refill:  0    [name] was seen today for follow-up.  Diagnoses and all orders for this visit:  Need for influenza vaccination -      Flu Vaccine QUAD High Dose(Fluad)  COPD with acute exacerbation (HCC)  Cellulitis of right lower extremity  Centrilobular emphysema (HCC)  CLL (chronic lymphocytic leukemia) ([place])  Other orders -     cephALEXin (KEFLEX) 500 MG capsule; Take 1 capsule (500 mg total) by mouth 4 (four) times daily.     This visit occurred during the SARS-CoV-2 public health emergency.  Safety protocols were in place, including screening questions prior to the visit, additional usage of staff PPE, and extensive cleaning of exam room while observing appropriate contact time as indicated for disinfecting solutions.    Michael Rumps, MD [place]

## 2020-04-04 NOTE — Assessment & Plan Note (Signed)
Has been stable.  He wonders if he can go ahead and get the booster vaccine of the Pfizer COVID-19 vaccine.  Given that he has CLL I think it would be reasonable for him to proceed with the third dose.  He was advised of this.

## 2020-04-04 NOTE — Assessment & Plan Note (Signed)
Patient has recovered well.  He has back to his baseline.  He will continue Symbicort 2 puffs twice daily and Spiriva once daily.  He will seek medical attention for worsening symptoms.

## 2020-04-06 ENCOUNTER — Encounter: Payer: Self-pay | Admitting: Family Medicine

## 2020-04-06 ENCOUNTER — Ambulatory Visit (INDEPENDENT_AMBULATORY_CARE_PROVIDER_SITE_OTHER): Payer: Medicare HMO | Admitting: Family Medicine

## 2020-04-06 ENCOUNTER — Other Ambulatory Visit: Payer: Self-pay

## 2020-04-06 DIAGNOSIS — L03115 Cellulitis of right lower limb: Secondary | ICD-10-CM | POA: Diagnosis not present

## 2020-04-06 NOTE — Progress Notes (Signed)
  Michael Rumps, MD Phone: [phone]  [name] is a 74 y.o. male who presents today for follow-up.  Cellulitis: The area has become less red.  There is still some discomfort.  He has taken 6 doses of Keflex.  No fevers.  He does not feel ill.  Social History   Tobacco Use  Smoking Status Former Smoker  . Packs/day: 1.00  . Years: 40.00  . Pack years: 40.00  . Types: Cigarettes  . Quit date: 07/21/2006  . Years since quitting: 13.7  Smokeless Tobacco Never Used     ROS see history of present illness  Objective  Physical Exam Vitals:   04/06/20 1218  BP: 130/70  Pulse: 83  Temp: 98 F (36.7 C)  SpO2: 97%    BP Readings from Last 3 Encounters:  04/06/20 130/70  04/04/20 124/80  03/22/20 129/78   Wt Readings from Last 3 Encounters:  04/06/20 214 lb 12.8 oz (97.4 kg)  04/04/20 215 lb 9.6 oz (97.8 kg)  03/22/20 212 lb (96.2 kg)    Physical Exam Skin:          Assessment/Plan: Please see individual problem list.  Cellulitis Improving.  Encouraged to finish the course of Keflex 500 mg 4 times daily for this.  If he has any worsening symptoms or becomes ill he will contact us or be evaluated right away.    No orders of the defined types were placed in this encounter.   No orders of the defined types were placed in this encounter.   [name] was seen today for follow-up.  Diagnoses and all orders for this visit:  Cellulitis of right lower extremity     This visit occurred during the SARS-CoV-2 public health emergency.  Safety protocols were in place, including screening questions prior to the visit, additional usage of staff PPE, and extensive cleaning of exam room while observing appropriate contact time as indicated for disinfecting solutions.    Michael Rumps, MD [place]

## 2020-04-06 NOTE — Patient Instructions (Signed)
Your cellulitis appears to be improving.  Please complete the course of Keflex.  If you have worsening redness or you develop any fever or start to feel ill please seek medical attention.

## 2020-04-06 NOTE — Assessment & Plan Note (Signed)
Improving.  Encouraged to finish the course of Keflex 500 mg 4 times daily for this.  If he has any worsening symptoms or becomes ill he will contact us or be evaluated right away.

## 2020-04-12 ENCOUNTER — Inpatient Hospital Stay (HOSPITAL_BASED_OUTPATIENT_CLINIC_OR_DEPARTMENT_OTHER): Payer: Medicare HMO | Admitting: Internal Medicine

## 2020-04-12 ENCOUNTER — Encounter: Payer: Self-pay | Admitting: Internal Medicine

## 2020-04-12 ENCOUNTER — Other Ambulatory Visit: Payer: Self-pay

## 2020-04-12 ENCOUNTER — Inpatient Hospital Stay: Payer: Medicare HMO

## 2020-04-12 DIAGNOSIS — C9110 Chronic lymphocytic leukemia of B-cell type not having achieved remission: Secondary | ICD-10-CM

## 2020-04-12 LAB — COMPREHENSIVE METABOLIC PANEL
ALT: 26 U/L (ref 0–44)
AST: 19 U/L (ref 15–41)
Albumin: 3.8 g/dL (ref 3.5–5.0)
Alkaline Phosphatase: 77 U/L (ref 38–126)
Anion gap: 8 (ref 5–15)
BUN: 19 mg/dL (ref 8–23)
CO2: 25 mmol/L (ref 22–32)
Calcium: 8.3 mg/dL — ABNORMAL LOW (ref 8.9–10.3)
Chloride: 105 mmol/L (ref 98–111)
Creatinine, Ser: 0.82 mg/dL (ref 0.61–1.24)
GFR calc Af Amer: >60 mL/min (ref >60)
GFR calc non Af Amer: >60 mL/min (ref >60)
Glucose, Bld: 157 mg/dL — ABNORMAL HIGH (ref 70–99)
Potassium: 4 mmol/L (ref 3.5–5.1)
Sodium: 138 mmol/L (ref 135–145)
Total Bilirubin: 1.7 mg/dL — ABNORMAL HIGH (ref 0.3–1.2)
Total Protein: 6 g/dL — ABNORMAL LOW (ref 6.5–8.1)

## 2020-04-12 LAB — CBC WITH DIFFERENTIAL/PLATELET
Abs Immature Granulocytes: 0.08 10*3/uL — ABNORMAL HIGH (ref 0.00–0.07)
Basophils Absolute: 0.1 10*3/uL (ref 0.0–0.1)
Basophils Relative: 1 %
Eosinophils Absolute: 0.2 10*3/uL (ref 0.0–0.5)
Eosinophils Relative: 3 %
HCT: 34.2 % — ABNORMAL LOW (ref 39.0–52.0)
Hemoglobin: 11.8 g/dL — ABNORMAL LOW (ref 13.0–17.0)
Immature Granulocytes: 1 %
Lymphocytes Relative: 34 %
Lymphs Abs: 2.2 10*3/uL (ref 0.7–4.0)
MCH: 34.9 pg — ABNORMAL HIGH (ref 26.0–34.0)
MCHC: 34.5 g/dL (ref 30.0–36.0)
MCV: 101.2 fL — ABNORMAL HIGH (ref 80.0–100.0)
Monocytes Absolute: 0.7 10*3/uL (ref 0.1–1.0)
Monocytes Relative: 11 %
Neutro Abs: 3.2 10*3/uL (ref 1.7–7.7)
Neutrophils Relative %: 50 %
Platelets: 110 10*3/uL — ABNORMAL LOW (ref 150–400)
RBC: 3.38 MIL/uL — ABNORMAL LOW (ref 4.22–5.81)
RDW: 15.3 % (ref 11.5–15.5)
WBC: 6.5 10*3/uL (ref 4.0–10.5)
nRBC: 0 % (ref 0.0–0.2)

## 2020-04-12 LAB — LACTATE DEHYDROGENASE: LDH: 201 U/L — ABNORMAL HIGH (ref 98–192)

## 2020-04-12 NOTE — Assessment & Plan Note (Addendum)
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  STABLE  AUG 4th 2021- CT-CR; STABLE.    # RE-START Venoclax [held sec to RSV] [100 mg sec to diarrhea; DI with Amio]. Labs today reviewed;  acceptable for treatment today.   # Acute bronchitis/RSV- recent COPD exacerbation/improved. Re-start Venatoclax.   # A.fib-on amiodarone [drug interaction-amiodarone] sinus rhythm-on Eliquis-STABLE.   # Muscle pain-sec to Ventoclax-continue tramadol prn-STABLE.   DISPOSITION: # follow up in 1 month MD; labs- cbc/cmp/ldh;immunoglobuin-ordered;  Dr.B

## 2020-04-12 NOTE — Progress Notes (Signed)
[place] OFFICE PROGRESS NOTE  Patient Care Team: Leone Haven, MD as PCP - General (Family Medicine) Rockey Situ Kathlene November, MD as PCP - Cardiology (Cardiology) [name], MD as Consulting Physician (Cardiology) Cammie Sickle, MD as Medical Oncologist (Hematology and Oncology)  Cancer Staging No matching staging information was found for the patient.   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  MARCH 2017- CT N/C/A/P- NED. STOP Ritux; surveillance   # AUG 2019- CT/PET- progression/NO transformation;  # NOV 2019- Progression; started Ibrutinib 420 mg/d. STOPPED in end of feb sec to extreme fatigue/joint pains/cramps  #November 01, 2018-start ibrutinib 280 mg a day; December 20, 2018-discontinue ibrutinib secondary multiple side effects.   #January 03, 2019 start [place] [July]; finished Dec 2020-; started [facility] maintenance [200 mg a day; DI-amiodarone]  #Mid March 2021-venetoclax dose reduced to 100 mg [second diarrhea].     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; IGVH- UN-MUTATED [poor prognosis]  SURVIVORSHIP: p  DIAGNOSIS: CLL   STAGE: IV  ;GOALS: control  CURRENT/MOST RECENT THERAPY : venetoclax     CLL (chronic lymphocytic leukemia) ([place])  01/03/2019 -  Chemotherapy   The patient had obinutuzumab (GAZYVA) 100 mg in sodium chloride 0.9 % 100 mL (0.9615 mg/mL) chemo infusion, 100 mg, Intravenous, Once, 6 of 6 cycles Administration: 100 mg (01/03/2019), 900 mg (01/04/2019), 1,000 mg (01/10/2019), 1,000 mg (01/31/2019), 1,000 mg (01/17/2019), 1,000 mg (02/28/2019), 1,000 mg (04/07/2019), 1,000 mg (05/05/2019), 1,000 mg (06/02/2019)  for  chemotherapy treatment.      INTERVAL HISTORY:  [name] 74 y.o.  male CLL-high risk currently on venetoclax is here for follow-up.  However venetoclax was held because of recent RSV infection.  Patient states his breathing is improved.  Cough is improved.  Continues to have joint pains and back pain.  Not any worse.  No other hospitalizations.  Appetite good.  No weight loss.  No night sweats.  \Review of Systems  Constitutional: Positive for malaise/fatigue. Negative for chills, diaphoresis and fever.  HENT: Negative for nosebleeds and sore throat.   Eyes: Negative for double vision.  Respiratory: Positive for cough and sputum production. Negative for hemoptysis, shortness of breath and wheezing.   Cardiovascular: Negative for chest pain, palpitations, orthopnea and leg swelling.  Gastrointestinal: Negative for abdominal pain, blood in stool, constipation, heartburn, melena, nausea and vomiting.  Genitourinary: Negative for dysuria, frequency and urgency.  Musculoskeletal: Positive for back pain, joint pain and myalgias.  Skin: Negative.  Negative for itching and rash.  Neurological: Negative for dizziness, tingling, focal weakness and headaches.  Psychiatric/Behavioral: Negative for depression. The patient is not nervous/anxious and does not have insomnia.       PAST MEDICAL HISTORY :  Past Medical History:  Diagnosis Date  . Anxiety   . Arthritis   . Atrial fibrillation ([place])    a. Dx 2013, recurred 02/2014, CHA2DS2VASc = 3 -->placed on Eliquis;  b. 02/2014 Echo: EF 50-55%, mid and apical anterior septum and mid and apical inf septum are abnl, mild to mod Ao sclerosis w/o AS.  Marland Kitchen Cancer associated pain   . Chicken pox   . Chronic lymphocytic leukemia ([place])  a. Dx 02/2014.  Marland Kitchen CLL (chronic lymphocytic leukemia) ([place])   . Complication of anesthesia    History of  PTSD--do not touch patient when waking up from surgery.  Marland Kitchen COPD (chronic obstructive pulmonary disease) ([place])    . Coronary artery disease    a. 04/2009 CABG x 3 (LIMA->LAD, VG->OM1, VG->PDA);  b. 09/2009 Cath: occluded VG x 2 w/ patent LIMA and L->R collats. EF 55%, mild antlat HK;  c. 10/2011 MV: EF 53%, no isch/infarct-->low risk.  Marland Kitchen Dysrhythmia    hx of a-fib  . GERD (gastroesophageal reflux disease)    occasional  . History of chemotherapy 2015-2016  . HOH (hard of hearing)    Bilateral Hearing Aids  . Hypertension   . Myocardial infarction ([place]) 2010  . OSA on CPAP    USE C-PAP  . Presence of permanent cardiac pacemaker 2017  . PTSD (post-traumatic stress disorder)   . PTSD (post-traumatic stress disorder)   . Pure hypercholesterolemia   . Rheumatic fever 1959  . 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 TIA (transient ischemic attack) 11/02/2015    PAST SURGICAL HISTORY :   Past Surgical History:  Procedure Laterality Date  . ABDOMINAL HERNIA REPAIR    . APPENDECTOMY  06/21/1985  . CARDIAC CATHETERIZATION  2010; 2011   ; Dr Fletcher Anon  . CORONARY ARTERY BYPASS GRAFT  04/2009   "CABG X3"  . EP IMPLANTABLE DEVICE N/A 03/03/2016   Procedure: Pacemaker Implant;  Surgeon: Deboraha Sprang, MD;  Location: [place] [facility];  Service: Cardiovascular;  Laterality: N/A;  . FOREIGN BODY REMOVAL  1968   "shrapnel in my tailbone"  . INGUINAL HERNIA REPAIR Right   . INSERT / REPLACE / REMOVE PACEMAKER    . JOINT REPLACEMENT Right 2018  . LAPAROSCOPIC CHOLECYSTECTOMY    . TONSILLECTOMY AND ADENOIDECTOMY  1956  . TOTAL HIP ARTHROPLASTY Right 10/22/2016   Procedure: TOTAL HIP ARTHROPLASTY;  Surgeon: Dereck Leep, MD;  Location: [facility];  Service: Orthopedics;  Laterality: Right;  . TOTAL HIP ARTHROPLASTY Left 11/04/2017   Procedure: TOTAL HIP ARTHROPLASTY;  Surgeon: Dereck Leep, MD;  Location: [facility];  Service: Orthopedics;  Laterality: Left;    FAMILY HISTORY :   Family History  Problem Relation Age of Onset  . Heart disease Mother   . Heart attack Mother   . Coronary  artery disease Other        family history    SOCIAL HISTORY:   Social History   Tobacco Use  . Smoking status: Former Smoker    Packs/day: 1.00    Years: 40.00    Pack years: 40.00    Types: Cigarettes    Quit date: 07/21/2006    Years since quitting: 13.7  . Smokeless tobacco: Never Used  Vaping Use  . Vaping Use: Former  Substance Use Topics  . Alcohol use: Not Currently  . Drug use: No    ALLERGIES:  has No Known Allergies.  MEDICATIONS:  Current Outpatient Medications  Medication Sig Dispense Refill  . acetaminophen (TYLENOL) 500 MG tablet Take 1,000 mg by mouth every 8 (eight) hours as needed for mild pain.    Marland Kitchen acyclovir (ZOVIRAX) 400 MG tablet Take 1 tablet (400 mg total) by mouth 2 (two) times daily. 60 tablet 3  . albuterol (PROVENTIL HFA;VENTOLIN HFA) 108 (90 Base) MCG/ACT inhaler Inhale 2 puffs into the lungs every 6 (six) hours as needed for wheezing or shortness of breath.  1 Inhaler 11  . amiodarone (PACERONE) 200 MG tablet Take 1/2 (one-half) tablet by mouth twice daily (Patient taking differently: Take 100 mg by mouth 2 (two) times daily. ) 90 tablet 3  . apixaban (ELIQUIS) 5 MG TABS tablet Take 5 mg by mouth 2 (two) times daily.    Marland Kitchen atorvastatin (LIPITOR) 40 MG tablet Take 40 mg by mouth daily. Start back slowly with 20 mg working up on slow titration to $RemoveBefo'40mg'SIeawCFfUxk$  per Dr Rockey Situ.    . budesonide-formoterol (SYMBICORT) 160-4.5 MCG/ACT inhaler Inhale 2 puffs into the lungs 2 (two) times daily. 1 Inhaler 11  . cetirizine (ZYRTEC) 10 MG tablet Take 10 mg by mouth daily as needed for allergies.     . Coenzyme Q10 (COQ10) 200 MG CAPS Take 200 mg by mouth daily.    Marland Kitchen ezetimibe (ZETIA) 10 MG tablet Take 1 tablet (10 mg total) by mouth daily. 90 tablet 3  . furosemide (LASIX) 20 MG tablet Take 1 tablet (20 mg) by mouth twice daily as needed     . Krill Oil 350 MG CAPS Take 350 mg by mouth as needed.     . metoprolol tartrate (LOPRESSOR) 50 MG tablet Take 1 tablet (50 mg  total) by mouth 2 (two) times daily. 180 tablet 1  . mirtazapine (REMERON) 15 MG tablet Take 15 mg by mouth at bedtime as needed (for panic associated with PTSD).     . Multiple Vitamin (MULTIVITAMIN WITH MINERALS) TABS tablet Take 1 tablet by mouth daily.    . nitroGLYCERIN (NITROSTAT) 0.4 MG SL tablet Place 1 tablet (0.4 mg total) under the tongue every 5 (five) minutes as needed for chest pain. 25 tablet 6  . traMADol (ULTRAM) 50 MG tablet Take 1 tablet (50 mg total) by mouth every 12 (twelve) hours as needed. 60 tablet 0  . venetoclax (VENCLEXTA) 10 MG TABS Take 1 tablet by mouth daily.  (Patient not taking: Reported on 04/12/2020)    . venetoclax (VENCLEXTA) 100 MG TABS Take 200 mg by mouth daily. (Take 2 tablets daily) (Patient not taking: Reported on 04/12/2020) 60 tablet 6   No current facility-administered medications for this visit.    PHYSICAL EXAMINATION: ECOG PERFORMANCE STATUS: 1 - Symptomatic but completely ambulatory  BP (!) 147/75 (BP Location: Left Arm, Patient Position: Sitting, Cuff Size: Large)   Pulse 61   Temp 97.7 F (36.5 C) (Tympanic)   Resp 18   Ht $R'5\' 8"'NB$  (1.727 m)   Wt 218 lb (98.9 kg)   SpO2 100%   BMI 33.15 kg/m   Filed Weights   04/12/20 1013  Weight: 218 lb (98.9 kg)    Physical Exam Constitutional:      Comments: Patient is alone.  HENT:     Head: Normocephalic and atraumatic.     Mouth/Throat:     Pharynx: No oropharyngeal exudate.  Eyes:     Pupils: Pupils are equal, round, and reactive to light.  Cardiovascular:     Rate and Rhythm: Normal rate and regular rhythm.  Pulmonary:     Effort: No respiratory distress.     Breath sounds: No wheezing.  Abdominal:     General: Bowel sounds are normal. There is no distension.     Palpations: Abdomen is soft. There is no mass.     Tenderness: There is no abdominal tenderness. There is no guarding or rebound.  Musculoskeletal:        General: No tenderness. Normal range of motion.  Cervical  back: Normal range of motion and neck supple.  Lymphadenopathy:     Comments: Resolved lymph nodes in the neck underarms.  Skin:    General: Skin is warm.  Neurological:     Mental Status: He is alert and oriented to person, place, and time.  Psychiatric:        Mood and Affect: Affect normal.     LABORATORY DATA:  I have reviewed the data as listed    Component Value Date/Time   NA 138 04/12/2020 0959   NA 139 10/11/2014 1800   K 4.0 04/12/2020 0959   K 3.3 (L) 10/11/2014 1800   CL 105 04/12/2020 0959   CL 106 10/11/2014 1800   CO2 25 04/12/2020 0959   CO2 27 10/11/2014 1800   GLUCOSE 157 (H) 04/12/2020 0959   GLUCOSE 107 (H) 10/11/2014 1800   BUN 19 04/12/2020 0959   BUN 15 10/11/2014 1800   CREATININE 0.82 04/12/2020 0959   CREATININE 0.89 10/11/2014 1800   CALCIUM 8.3 (L) 04/12/2020 0959   CALCIUM 8.8 (L) 10/11/2014 1800   PROT 6.0 (L) 04/12/2020 0959   PROT 6.7 05/18/2017 1048   PROT 6.4 (L) 10/11/2014 1800   ALBUMIN 3.8 04/12/2020 0959   ALBUMIN 4.3 05/18/2017 1048   ALBUMIN 4.1 10/11/2014 1800   AST 19 04/12/2020 0959   AST 23 10/11/2014 1800   ALT 26 04/12/2020 0959   ALT 22 10/11/2014 1800   ALKPHOS 77 04/12/2020 0959   ALKPHOS 61 10/11/2014 1800   BILITOT 1.7 (H) 04/12/2020 0959   BILITOT 0.7 05/18/2017 1048   BILITOT 0.9 10/11/2014 1800   GFRNONAA >60 04/12/2020 0959   GFRNONAA >60 10/11/2014 1800   GFRAA >60 04/12/2020 0959   GFRAA >60 10/11/2014 1800    No results found for: SPEP, UPEP  Lab Results  Component Value Date   WBC 6.5 04/12/2020   NEUTROABS 3.2 04/12/2020   HGB 11.8 (L) 04/12/2020   HCT 34.2 (L) 04/12/2020   MCV 101.2 (H) 04/12/2020   PLT 110 (L) 04/12/2020      Chemistry      Component Value Date/Time   NA 138 04/12/2020 0959   NA 139 10/11/2014 1800   K 4.0 04/12/2020 0959   K 3.3 (L) 10/11/2014 1800   CL 105 04/12/2020 0959   CL 106 10/11/2014 1800   CO2 25 04/12/2020 0959   CO2 27 10/11/2014 1800   BUN 19  04/12/2020 0959   BUN 15 10/11/2014 1800   CREATININE 0.82 04/12/2020 0959   CREATININE 0.89 10/11/2014 1800      Component Value Date/Time   CALCIUM 8.3 (L) 04/12/2020 0959   CALCIUM 8.8 (L) 10/11/2014 1800   ALKPHOS 77 04/12/2020 0959   ALKPHOS 61 10/11/2014 1800   AST 19 04/12/2020 0959   AST 23 10/11/2014 1800   ALT 26 04/12/2020 0959   ALT 22 10/11/2014 1800   BILITOT 1.7 (H) 04/12/2020 0959   BILITOT 0.7 05/18/2017 1048   BILITOT 0.9 10/11/2014 1800       RADIOGRAPHIC STUDIES: I have personally reviewed the radiological images as listed and agreed with the findings in the report. No results found.   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  STABLE  AUG 4th 2021- CT-CR; STABLE.    # RE-START Venoclax [held sec to RSV] [100 mg sec to diarrhea; DI with Amio]. Labs  today reviewed;  acceptable for treatment today.   # Acute bronchitis/RSV- recent COPD exacerbation/improved. Re-start Venatoclax.   # A.fib-on amiodarone [drug interaction-amiodarone] sinus rhythm-on Eliquis-STABLE.   # Muscle pain-sec to Ventoclax-continue tramadol prn-STABLE.   DISPOSITION: # follow up in 1 month MD; labs- cbc/cmp/ldh;immunoglobuin-ordered;  Dr.B    Orders Placed This Encounter  Procedures  . Immunoglobulins, QN, A/E/G/M    Standing Status:   Future    Standing Expiration Date:   04/12/2021  . CBC with Differential/Platelet    Standing Status:   Future    Standing Expiration Date:   04/12/2021  . Comprehensive metabolic panel    Standing Status:   Future    Standing Expiration Date:   04/12/2021  . Lactate dehydrogenase    Standing Status:   Future    Standing Expiration Date:   04/12/2021   All questions were answered. The patient knows to call the clinic with any problems, questions or concerns.      Cammie Sickle, MD 04/13/2020  8:38 AM

## 2020-05-02 ENCOUNTER — Other Ambulatory Visit: Payer: Self-pay

## 2020-05-02 NOTE — Telephone Encounter (Signed)
Patient called and stated that he is needing a refill on his venclexta. Per your last OV you held it due to diarrhea. Please advise on refill Dr. Jacinto Reap.

## 2020-05-03 NOTE — Telephone Encounter (Signed)
Dr. B - Please advise.  

## 2020-05-04 ENCOUNTER — Other Ambulatory Visit: Payer: Self-pay | Admitting: Internal Medicine

## 2020-05-04 MED ORDER — VENCLEXTA 100 MG PO TABS: 100.0000 mg | ORAL_TABLET | Freq: Every day | ORAL | 6 refills | Status: DC

## 2020-05-04 MED ORDER — VENCLEXTA 100 MG PO TABS: 200.0000 mg | ORAL_TABLET | Freq: Every day | ORAL | 6 refills | Status: DC

## 2020-05-10 ENCOUNTER — Other Ambulatory Visit: Payer: Self-pay

## 2020-05-10 ENCOUNTER — Inpatient Hospital Stay (HOSPITAL_BASED_OUTPATIENT_CLINIC_OR_DEPARTMENT_OTHER): Payer: Medicare HMO | Admitting: Internal Medicine

## 2020-05-10 ENCOUNTER — Inpatient Hospital Stay: Payer: Medicare HMO | Attending: Internal Medicine

## 2020-05-10 ENCOUNTER — Encounter: Payer: Self-pay | Admitting: Internal Medicine

## 2020-05-10 DIAGNOSIS — C9110 Chronic lymphocytic leukemia of B-cell type not having achieved remission: Secondary | ICD-10-CM

## 2020-05-10 DIAGNOSIS — M791 Myalgia, unspecified site: Secondary | ICD-10-CM | POA: Insufficient documentation

## 2020-05-10 DIAGNOSIS — Z7901 Long term (current) use of anticoagulants: Secondary | ICD-10-CM | POA: Diagnosis not present

## 2020-05-10 DIAGNOSIS — I4891 Unspecified atrial fibrillation: Secondary | ICD-10-CM | POA: Diagnosis not present

## 2020-05-10 LAB — CBC WITH DIFFERENTIAL/PLATELET
Abs Immature Granulocytes: 0.05 10*3/uL (ref 0.00–0.07)
Basophils Absolute: 0 10*3/uL (ref 0.0–0.1)
Basophils Relative: 0 %
Eosinophils Absolute: 0 10*3/uL (ref 0.0–0.5)
Eosinophils Relative: 0 %
HCT: 34.6 % — ABNORMAL LOW (ref 39.0–52.0)
Hemoglobin: 11.6 g/dL — ABNORMAL LOW (ref 13.0–17.0)
Immature Granulocytes: 1 %
Lymphocytes Relative: 35 %
Lymphs Abs: 2.1 10*3/uL (ref 0.7–4.0)
MCH: 34.5 pg — ABNORMAL HIGH (ref 26.0–34.0)
MCHC: 33.5 g/dL (ref 30.0–36.0)
MCV: 103 fL — ABNORMAL HIGH (ref 80.0–100.0)
Monocytes Absolute: 0.9 10*3/uL (ref 0.1–1.0)
Monocytes Relative: 15 %
Neutro Abs: 2.9 10*3/uL (ref 1.7–7.7)
Neutrophils Relative %: 49 %
Platelets: 132 10*3/uL — ABNORMAL LOW (ref 150–400)
RBC: 3.36 MIL/uL — ABNORMAL LOW (ref 4.22–5.81)
RDW: 15 % (ref 11.5–15.5)
WBC: 5.9 10*3/uL (ref 4.0–10.5)
nRBC: 0 % (ref 0.0–0.2)

## 2020-05-10 LAB — COMPREHENSIVE METABOLIC PANEL
ALT: 22 U/L (ref 0–44)
AST: 20 U/L (ref 15–41)
Albumin: 4 g/dL (ref 3.5–5.0)
Alkaline Phosphatase: 82 U/L (ref 38–126)
Anion gap: 7 (ref 5–15)
BUN: 24 mg/dL — ABNORMAL HIGH (ref 8–23)
CO2: 27 mmol/L (ref 22–32)
Calcium: 8.6 mg/dL — ABNORMAL LOW (ref 8.9–10.3)
Chloride: 109 mmol/L (ref 98–111)
Creatinine, Ser: 1 mg/dL (ref 0.61–1.24)
GFR, Estimated: >60 mL/min (ref >60)
Glucose, Bld: 111 mg/dL — ABNORMAL HIGH (ref 70–99)
Potassium: 4.4 mmol/L (ref 3.5–5.1)
Sodium: 143 mmol/L (ref 135–145)
Total Bilirubin: 1.1 mg/dL (ref 0.3–1.2)
Total Protein: 6.3 g/dL — ABNORMAL LOW (ref 6.5–8.1)

## 2020-05-10 LAB — LACTATE DEHYDROGENASE: LDH: 164 U/L (ref 98–192)

## 2020-05-10 NOTE — Assessment & Plan Note (Addendum)
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  STABLE  AUG 4th 2021- CT-CR; STABLE.     # currently on Venoclax [00 mg sec to diarrhea; DI with Amio]. Labs today reviewed;  acceptable for treatment today. Will re-image in next 4-6 months  # A.fib-on amiodarone [drug interaction-amiodarone] sinus rhythm-on Eliquis-STABLE.   # Muscle pain-sec to Ventoclax-G-1-2; monitor for now; -continue tramadol prn--STABLE.   DISPOSITION: # follow up in 1 month MD; labs- cbc/cmp/ldh;  Dr.B

## 2020-05-10 NOTE — Addendum Note (Signed)
Addended by: Delice Bison E on: 05/10/2020 01:39 PM   Modules accepted: Orders

## 2020-05-10 NOTE — Progress Notes (Signed)
Pt in for follow up, states having pain in lower extremities. Pt denies any other concerns.

## 2020-05-10 NOTE — Progress Notes (Signed)
[facility] OFFICE PROGRESS NOTE  Patient Care Team: Glori Luis, MD as PCP - General (Family Medicine) Mariah Milling Tollie Pizza, MD as PCP - Cardiology (Cardiology) Antonieta Iba, MD as Consulting Physician (Cardiology) Earna Coder, MD as Medical Oncologist (Hematology and Oncology)  Cancer Staging No matching staging information was found for the patient.   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  MARCH 2017- CT N/C/A/P- NED. STOP Ritux; surveillance   # AUG 2019- CT/PET- progression/NO transformation;  # NOV 2019- Progression; started Ibrutinib 420 mg/d. STOPPED in end of feb sec to extreme fatigue/joint pains/cramps  #November 01, 2018-start ibrutinib 280 mg a day; December 20, 2018-discontinue ibrutinib secondary multiple side effects.   #January 03, 2019 start Gazyva + Ven [July]; finished Dec 2020-; started [facility] maintenance [200 mg a day; DI-amiodarone]  #Mid March 2021-venetoclax dose reduced to 100 mg [second diarrhea].     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      INTERVAL HISTORY:  [name] 74 y.o.  male CLL-high risk currently on venetoclax is here for follow-up  Patient continues have chronic mild joint pains/myalgias bilateral thighs.  This is not any worse.  No nausea no vomiting no diarrhea.  No further hospitalization.  Appetite is good.  No new lumps or bumps.  No night sweats.   \Review of Systems  Constitutional: Positive for malaise/fatigue. Negative for chills, diaphoresis and fever.  HENT: Negative for nosebleeds and sore throat.   Eyes: Negative for double vision.  Respiratory: Positive for cough and sputum production. Negative for hemoptysis, shortness of breath and wheezing.   Cardiovascular: Negative for chest pain, palpitations, orthopnea and leg swelling.  Gastrointestinal: Negative for abdominal pain, blood in stool, constipation, heartburn, melena, nausea and vomiting.  Genitourinary: Negative for dysuria, frequency and urgency.  Musculoskeletal: Positive for back pain, joint pain and myalgias.  Skin: Negative.  Negative for itching and rash.  Neurological: Negative for dizziness, tingling, focal weakness and headaches.  Psychiatric/Behavioral: Negative for depression. The patient is not nervous/anxious and does not have insomnia.       PAST MEDICAL HISTORY :  Past Medical History:  Diagnosis Date  . Anxiety   . Arthritis   . Atrial fibrillation (HCC)    a. Dx 2013, recurred 02/2014, CHA2DS2VASc = 3 -->placed on Eliquis;  b. 02/2014 Echo: EF 50-55%, mid and apical anterior septum and mid and apical inf septum are abnl, mild to mod Ao sclerosis w/o AS.  Marland Kitchen Cancer associated pain   . Chicken pox   . Chronic lymphocytic leukemia (HCC)    a. Dx 02/2014.  Marland Kitchen  CLL (chronic lymphocytic leukemia) ([place])   . Complication of anesthesia    History of  PTSD--do not touch patient when waking up from surgery.  Marland Kitchen COPD (chronic obstructive pulmonary disease) ([place])   . Coronary artery disease    a.  04/2009 CABG x 3 (LIMA->LAD, VG->OM1, VG->PDA);  b. 09/2009 Cath: occluded VG x 2 w/ patent LIMA and L->R collats. EF 55%, mild antlat HK;  c. 10/2011 MV: EF 53%, no isch/infarct-->low risk.  Marland Kitchen Dysrhythmia    hx of a-fib  . GERD (gastroesophageal reflux disease)    occasional  . History of chemotherapy 2015-2016  . HOH (hard of hearing)    Bilateral Hearing Aids  . Hypertension   . Myocardial infarction ([place]) 2010  . OSA on CPAP    USE C-PAP  . Presence of permanent cardiac pacemaker 2017  . PTSD (post-traumatic stress disorder)   . PTSD (post-traumatic stress disorder)   . Pure hypercholesterolemia   . Rheumatic fever 1959  . 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 TIA (transient ischemic attack) 11/02/2015    PAST SURGICAL HISTORY :   Past Surgical History:  Procedure Laterality Date  . ABDOMINAL HERNIA REPAIR    . APPENDECTOMY  06/21/1985  . CARDIAC CATHETERIZATION  2010; 2011   ; Dr Fletcher Anon  . CORONARY ARTERY BYPASS GRAFT  04/2009   "CABG X3"  . EP IMPLANTABLE DEVICE N/A 03/03/2016   Procedure: Pacemaker Implant;  Surgeon: Deboraha Sprang, MD;  Location: [facility];  Service: Cardiovascular;  Laterality: N/A;  . FOREIGN BODY REMOVAL  1968   "shrapnel in my tailbone"  . INGUINAL HERNIA REPAIR Right   . INSERT / REPLACE / REMOVE PACEMAKER    . JOINT REPLACEMENT Right 2018  . LAPAROSCOPIC CHOLECYSTECTOMY    . TONSILLECTOMY AND ADENOIDECTOMY  1956  . TOTAL HIP ARTHROPLASTY Right 10/22/2016   Procedure: TOTAL HIP ARTHROPLASTY;  Surgeon: Dereck Leep, MD;  Location: [facility];  Service: Orthopedics;  Laterality: Right;  . TOTAL HIP ARTHROPLASTY Left 11/04/2017   Procedure: TOTAL HIP ARTHROPLASTY;  Surgeon: Dereck Leep, MD;  Location: [facility];  Service: Orthopedics;  Laterality: Left;    FAMILY HISTORY :   Family History  Problem Relation Age of Onset  . Heart disease Mother   . Heart attack Mother   . Coronary artery disease Other        family  history    SOCIAL HISTORY:   Social History   Tobacco Use  . Smoking status: Former Smoker    Packs/day: 1.00    Years: 40.00    Pack years: 40.00    Types: Cigarettes    Quit date: 07/21/2006    Years since quitting: 13.8  . Smokeless tobacco: Never Used  Vaping Use  . Vaping Use: Former  Substance Use Topics  . Alcohol use: Not Currently  . Drug use: No    ALLERGIES:  has No Known Allergies.  MEDICATIONS:  Current Outpatient Medications  Medication Sig Dispense Refill  . acetaminophen (TYLENOL) 500 MG tablet Take 1,000 mg by mouth every 8 (eight) hours as needed for mild pain.    Marland Kitchen acyclovir (ZOVIRAX) 400 MG tablet Take 1 tablet (400 mg total) by mouth 2 (two) times daily. 60 tablet 3  . albuterol (PROVENTIL HFA;VENTOLIN HFA) 108 (90 Base) MCG/ACT inhaler Inhale 2 puffs into the lungs every 6 (six) hours as needed for wheezing or shortness of breath. 1 Inhaler 11  .  amiodarone (PACERONE) 200 MG tablet Take 1/2 (one-half) tablet by mouth twice daily (Patient taking differently: Take 100 mg by mouth 2 (two) times daily. ) 90 tablet 3  . apixaban (ELIQUIS) 5 MG TABS tablet Take 5 mg by mouth 2 (two) times daily.    Marland Kitchen atorvastatin (LIPITOR) 40 MG tablet Take 40 mg by mouth daily. Start back slowly with 20 mg working up on slow titration to $RemoveBefo'40mg'[id number]$  per Dr Rockey Situ.    . budesonide-formoterol (SYMBICORT) 160-4.5 MCG/ACT inhaler Inhale 2 puffs into the lungs 2 (two) times daily. 1 Inhaler 11  . cetirizine (ZYRTEC) 10 MG tablet Take 10 mg by mouth daily as needed for allergies.     . Coenzyme Q10 (COQ10) 200 MG CAPS Take 200 mg by mouth daily.    Marland Kitchen ezetimibe (ZETIA) 10 MG tablet Take 1 tablet (10 mg total) by mouth daily. 90 tablet 3  . furosemide (LASIX) 20 MG tablet Take 1 tablet (20 mg) by mouth twice daily as needed     . Krill Oil 350 MG CAPS Take 350 mg by mouth as needed.     . metoprolol tartrate (LOPRESSOR) 50 MG tablet Take 1 tablet (50 mg total) by mouth 2 (two) times daily. 180  tablet 1  . mirtazapine (REMERON) 15 MG tablet Take 15 mg by mouth at bedtime as needed (for panic associated with PTSD).     . Multiple Vitamin (MULTIVITAMIN WITH MINERALS) TABS tablet Take 1 tablet by mouth daily.    . nitroGLYCERIN (NITROSTAT) 0.4 MG SL tablet Place 1 tablet (0.4 mg total) under the tongue every 5 (five) minutes as needed for chest pain. 25 tablet 6  . traMADol (ULTRAM) 50 MG tablet Take 1 tablet (50 mg total) by mouth every 12 (twelve) hours as needed. 60 tablet 0  . venetoclax (VENCLEXTA) 100 MG TABS Take 100 mg by mouth daily. 30 tablet 6   No current facility-administered medications for this visit.    PHYSICAL EXAMINATION: ECOG PERFORMANCE STATUS: 1 - Symptomatic but completely ambulatory  BP 136/78 (BP Location: Left Arm, Patient Position: Sitting)   Pulse 60   Temp 97.6 F (36.4 C) (Tympanic)   Resp 18   Wt 219 lb 6.4 oz (99.5 kg)   SpO2 100%   BMI 33.36 kg/m   Filed Weights   05/10/20 1106  Weight: 219 lb 6.4 oz (99.5 kg)    Physical Exam Constitutional:      Comments: Patient is alone.  HENT:     Head: Normocephalic and atraumatic.     Mouth/Throat:     Pharynx: No oropharyngeal exudate.  Eyes:     Pupils: Pupils are equal, round, and reactive to light.  Cardiovascular:     Rate and Rhythm: Normal rate and regular rhythm.  Pulmonary:     Effort: No respiratory distress.     Breath sounds: No wheezing.  Abdominal:     General: Bowel sounds are normal. There is no distension.     Palpations: Abdomen is soft. There is no mass.     Tenderness: There is no abdominal tenderness. There is no guarding or rebound.  Musculoskeletal:        General: No tenderness. Normal range of motion.     Cervical back: Normal range of motion and neck supple.  Lymphadenopathy:     Comments: Resolved lymph nodes in the neck underarms.  Skin:    General: Skin is warm.  Neurological:     Mental Status: He is alert  and oriented to person, place, and time.   Psychiatric:        Mood and Affect: Affect normal.     LABORATORY DATA:  I have reviewed the data as listed    Component Value Date/Time   NA 143 05/10/2020 1037   NA 139 10/11/2014 1800   K 4.4 05/10/2020 1037   K 3.3 (L) 10/11/2014 1800   CL 109 05/10/2020 1037   CL 106 10/11/2014 1800   CO2 27 05/10/2020 1037   CO2 27 10/11/2014 1800   GLUCOSE 111 (H) 05/10/2020 1037   GLUCOSE 107 (H) 10/11/2014 1800   BUN 24 (H) 05/10/2020 1037   BUN 15 10/11/2014 1800   CREATININE 1.00 05/10/2020 1037   CREATININE 0.89 10/11/2014 1800   CALCIUM 8.6 (L) 05/10/2020 1037   CALCIUM 8.8 (L) 10/11/2014 1800   PROT 6.3 (L) 05/10/2020 1037   PROT 6.7 05/18/2017 1048   PROT 6.4 (L) 10/11/2014 1800   ALBUMIN 4.0 05/10/2020 1037   ALBUMIN 4.3 05/18/2017 1048   ALBUMIN 4.1 10/11/2014 1800   AST 20 05/10/2020 1037   AST 23 10/11/2014 1800   ALT 22 05/10/2020 1037   ALT 22 10/11/2014 1800   ALKPHOS 82 05/10/2020 1037   ALKPHOS 61 10/11/2014 1800   BILITOT 1.1 05/10/2020 1037   BILITOT 0.7 05/18/2017 1048   BILITOT 0.9 10/11/2014 1800   GFRNONAA >60 05/10/2020 1037   GFRNONAA >60 10/11/2014 1800   GFRAA >60 04/12/2020 0959   GFRAA >60 10/11/2014 1800    No results found for: SPEP, UPEP  Lab Results  Component Value Date   WBC 5.9 05/10/2020   NEUTROABS 2.9 05/10/2020   HGB 11.6 (L) 05/10/2020   HCT 34.6 (L) 05/10/2020   MCV 103.0 (H) 05/10/2020   PLT 132 (L) 05/10/2020      Chemistry      Component Value Date/Time   NA 143 05/10/2020 1037   NA 139 10/11/2014 1800   K 4.4 05/10/2020 1037   K 3.3 (L) 10/11/2014 1800   CL 109 05/10/2020 1037   CL 106 10/11/2014 1800   CO2 27 05/10/2020 1037   CO2 27 10/11/2014 1800   BUN 24 (H) 05/10/2020 1037   BUN 15 10/11/2014 1800   CREATININE 1.00 05/10/2020 1037   CREATININE 0.89 10/11/2014 1800      Component Value Date/Time   CALCIUM 8.6 (L) 05/10/2020 1037   CALCIUM 8.8 (L) 10/11/2014 1800   ALKPHOS 82 05/10/2020 1037    ALKPHOS 61 10/11/2014 1800   AST 20 05/10/2020 1037   AST 23 10/11/2014 1800   ALT 22 05/10/2020 1037   ALT 22 10/11/2014 1800   BILITOT 1.1 05/10/2020 1037   BILITOT 0.7 05/18/2017 1048   BILITOT 0.9 10/11/2014 1800       RADIOGRAPHIC STUDIES: I have personally reviewed the radiological images as listed and agreed with the findings in the report. No results found.   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  STABLE  AUG 4th 2021- CT-CR; STABLE.     # currently on Venoclax [00 mg sec to diarrhea; DI with Amio]. Labs today reviewed;  acceptable for treatment today. Will re-image in next 4-6 months  # A.fib-on amiodarone [drug interaction-amiodarone] sinus rhythm-on Eliquis-STABLE.   # Muscle pain-sec to Ventoclax-G-1-2; monitor for now; -continue tramadol prn--STABLE.   DISPOSITION: # follow up in 1 month MD;  labs- cbc/cmp/ldh;immunoglobuin-ordered;  Dr.B    No orders of the defined types were placed in this encounter.  All questions were answered. The patient knows to call the clinic with any problems, questions or concerns.      Cammie Sickle, MD 05/10/2020 11:45 AM

## 2020-05-13 LAB — IMMUNOGLOBULINS A/E/G/M, SERUM
IgA: 24 mg/dL — ABNORMAL LOW (ref 61–437)
IgE (Immunoglobulin E), Serum: 5 IU/mL — ABNORMAL LOW (ref 6–495)
IgG (Immunoglobin G), Serum: 310 mg/dL — ABNORMAL LOW (ref 603–1613)
IgM (Immunoglobulin M), Srm: <5 mg/dL — ABNORMAL LOW (ref 15–143)

## 2020-05-14 ENCOUNTER — Ambulatory Visit (INDEPENDENT_AMBULATORY_CARE_PROVIDER_SITE_OTHER): Payer: Medicare HMO

## 2020-05-14 VITALS — Ht 68.0 in | Wt 219.0 lb

## 2020-05-14 DIAGNOSIS — Z0000 Encounter for general adult medical examination without abnormal findings: Secondary | ICD-10-CM | POA: Diagnosis not present

## 2020-05-14 MED FILL — VENCLEXTA 100 MG TABS: 100 | 30 days supply | Qty: 30 | Fill #5

## 2020-05-14 NOTE — Patient Instructions (Addendum)
[name] , Thank you for taking time to come for your Medicare Wellness Visit. I appreciate your ongoing commitment to your health goals. Please review the following plan we discussed and let me know if I can assist you in the future.   These are the goals we discussed: Goals     Maintain Healthy Lifestyle     Stay hydrated Healthy diet Stay [place]        This is a list of the screening recommended for you and due dates:  Health Maintenance  Topic Date Due   Colon Cancer Screening  07/02/2022   Tetanus Vaccine  01/15/2028   Flu Shot  Completed   COVID-19 Vaccine  Completed    Hepatitis C: One time screening is recommended by Center for Disease Control  (CDC) for  adults born from [birth year] through 1966.   Completed   Pneumonia vaccines  Completed    Immunizations Immunization History  Administered Date(s) Administered   Fluad Quad(high Dose 65+) 04/04/2020   Influenza Split 05/21/2012   Influenza, High Dose Seasonal PF 04/21/2018   Influenza,inj,Quad PF,6+ Mos 03/16/2014, 06/20/2015, 04/27/2016   PFIZER SARS-COV-2 Vaccination 09/15/2019, 10/11/2019   Pneumococcal Conjugate-13 07/05/2015   Pneumococcal Polysaccharide-23 01/07/2011   Tdap 01/14/2018   Keep all routine maintenance appointments.   Follow up 07/18/20  Advanced directives: End of life planning; Advance aging; Advanced directives discussed.  Copy of current HCPOA/Living Will requested.    Conditions/risks identified: none new.  Follow up in one year for your annual wellness visit.   Preventive Care 33 Years and Older, Male Preventive care refers to lifestyle choices and visits with your health care provider that can promote health and wellness. What does preventive care include?  A yearly physical exam. This is also called an annual well check.  Dental exams once or twice a year.  Routine eye exams. Ask your health care provider how often you should have your eyes  checked.  Personal lifestyle choices, including:  Daily care of your teeth and gums.  Regular physical activity.  Eating a healthy diet.  Avoiding tobacco and drug use.  Limiting alcohol use.  Practicing safe sex.  Taking low doses of aspirin every day.  Taking vitamin and mineral supplements as recommended by your health care provider. What happens during an annual well check? The services and screenings done by your health care provider during your annual well check will depend on your age, overall health, lifestyle risk factors, and family history of disease. Counseling  Your health care provider may ask you questions about your:  Alcohol use.  Tobacco use.  Drug use.  Emotional well-being.  Home and relationship well-being.  Sexual activity.  Eating habits.  History of falls.  Memory and ability to understand (cognition).  Work and work Statistician. Screening  You may have the following tests or measurements:  Height, weight, and BMI.  Blood pressure.  Lipid and cholesterol levels. These may be checked every 5 years, or more frequently if you are over 60 years old.  Skin check.  Lung cancer screening. You may have this screening every year starting at age 32 if you have a 30-pack-year history of smoking and currently smoke or have quit within the past 15 years.  Fecal occult blood test (FOBT) of the stool. You may have this test every year starting at age 45.  Flexible sigmoidoscopy or colonoscopy. You may have a sigmoidoscopy every 5 years or a colonoscopy every 10 years  starting at age 68.  Prostate cancer screening. Recommendations will vary depending on your family history and other risks.  Hepatitis C blood test.  Hepatitis B blood test.  Sexually transmitted disease (STD) testing.  Diabetes screening. This is done by checking your blood sugar (glucose) after you have not eaten for a while (fasting). You may have this done every 1-3  years.  Abdominal aortic aneurysm (AAA) screening. You may need this if you are a current or former smoker.  Osteoporosis. You may be screened starting at age 13 if you are at high risk. Talk with your health care provider about your test results, treatment options, and if necessary, the need for more tests. Vaccines  Your health care provider may recommend certain vaccines, such as:  Influenza vaccine. This is recommended every year.  Tetanus, diphtheria, and acellular pertussis (Tdap, Td) vaccine. You may need a Td booster every 10 years.  Zoster vaccine. You may need this after age 71.  Pneumococcal 13-valent conjugate (PCV13) vaccine. One dose is recommended after age 79.  Pneumococcal polysaccharide (PPSV23) vaccine. One dose is recommended after age 83. Talk to your health care provider about which screenings and vaccines you need and how often you need them. This information is not intended to replace advice given to you by your health care provider. Make sure you discuss any questions you have with your health care provider. Document Released: 08/03/2015 Document Revised: 03/26/2016 Document Reviewed: 05/08/2015 Elsevier Interactive Patient Education  2017 [place] Prevention in the Home Falls can cause injuries. They can happen to people of all ages. There are many things you can do to make your home safe and to help prevent falls. What can I do on the outside of my home?  Regularly fix the edges of walkways and driveways and fix any cracks.  Remove anything that might make you trip as you walk through a door, such as a raised step or threshold.  Trim any bushes or trees on the path to your home.  Use bright outdoor lighting.  Clear any walking paths of anything that might make someone trip, such as rocks or tools.  Regularly check to see if handrails are loose or broken. Make sure that both sides of any steps have handrails.  Any raised decks and porches  should have guardrails on the edges.  Have any leaves, snow, or ice cleared regularly.  Use sand or salt on walking paths during winter.  Clean up any spills in your garage right away. This includes oil or grease spills. What can I do in the bathroom?  Use night lights.  Install grab bars by the toilet and in the tub and shower. Do not use towel bars as grab bars.  Use non-skid mats or decals in the tub or shower.  If you need to sit down in the shower, use a plastic, non-slip stool.  Keep the floor dry. Clean up any water that spills on the floor as soon as it happens.  Remove soap buildup in the tub or shower regularly.  Attach bath mats securely with double-sided non-slip rug tape.  Do not have throw rugs and other things on the floor that can make you trip. What can I do in the bedroom?  Use night lights.  Make sure that you have a light by your bed that is easy to reach.  Do not use any sheets or blankets that are too big for your bed. They should not hang down  onto the floor.  Have a firm chair that has side arms. You can use this for support while you get dressed.  Do not have throw rugs and other things on the floor that can make you trip. What can I do in the kitchen?  Clean up any spills right away.  Avoid walking on wet floors.  Keep items that you use a lot in easy-to-reach places.  If you need to reach something above you, use a strong step stool that has a grab bar.  Keep electrical cords out of the way.  Do not use floor polish or wax that makes floors slippery. If you must use wax, use non-skid floor wax.  Do not have throw rugs and other things on the floor that can make you trip. What can I do with my stairs?  Do not leave any items on the stairs.  Make sure that there are handrails on both sides of the stairs and use them. Fix handrails that are broken or loose. Make sure that handrails are as long as the stairways.  Check any carpeting to  make sure that it is firmly attached to the stairs. Fix any carpet that is loose or worn.  Avoid having throw rugs at the top or bottom of the stairs. If you do have throw rugs, attach them to the floor with carpet tape.  Make sure that you have a light switch at the top of the stairs and the bottom of the stairs. If you do not have them, ask someone to add them for you. What else can I do to help prevent falls?  Wear shoes that:  Do not have high heels.  Have rubber bottoms.  Are comfortable and fit you well.  Are closed at the toe. Do not wear sandals.  If you use a stepladder:  Make sure that it is fully opened. Do not climb a closed stepladder.  Make sure that both sides of the stepladder are locked into place.  Ask someone to hold it for you, if possible.  Clearly mark and make sure that you can see:  Any grab bars or handrails.  First and last steps.  Where the edge of each step is.  Use tools that help you move around (mobility aids) if they are needed. These include:  Canes.  Walkers.  Scooters.  Crutches.  Turn on the lights when you go into a dark area. Replace any light bulbs as soon as they burn out.  Set up your furniture so you have a clear path. Avoid moving your furniture around.  If any of your floors are uneven, fix them.  If there are any pets around you, be aware of where they are.  Review your medicines with your doctor. Some medicines can make you feel dizzy. This can increase your chance of falling. Ask your doctor what other things that you can do to help prevent falls. This information is not intended to replace advice given to you by your health care provider. Make sure you discuss any questions you have with your health care provider. Document Released: 05/03/2009 Document Revised: 12/13/2015 Document Reviewed: 08/11/2014 Elsevier Interactive Patient Education  2017 [employer].

## 2020-05-14 NOTE — Progress Notes (Signed)
Subjective:   [name] is a 74 y.o. male who presents for Medicare Annual/Subsequent preventive examination.  Review of Systems    No ROS.  Medicare Wellness Virtual Visit.   Cardiac Risk Factors include: advanced age (>24men, >79 women);male gender;hypertension     Objective:    Today's Vitals   05/14/20 1037  Weight: 219 lb (99.3 kg)  Height: 5\' 8"  (1.727 m)   Body mass index is 33.3 kg/m.  Advanced Directives 05/14/2020 05/10/2020 03/14/2020 02/03/2020 12/22/2019 11/24/2019 06/01/2019  Does Patient Have a Medical Advance Directive? Yes Yes No Yes Yes Yes Yes  Type of Paramedic of [place];Living will [place];Living will - Healthcare Power of [place];Living will Healthcare Power of [place];Living will  Does patient want to make changes to medical advance directive? - - - No - Patient declined - - No - Patient declined  Copy of [place] in Chart? No - copy requested - - No - copy requested - - No - copy requested  Would patient like information on creating a medical advance directive? - - No - Patient declined - - - No - Patient declined    Current Medications (verified) Outpatient Encounter Medications as of 05/14/2020  Medication Sig  . acetaminophen (TYLENOL) 500 MG tablet Take 1,000 mg by mouth every 8 (eight) hours as needed for mild pain.  Marland Kitchen acyclovir (ZOVIRAX) 400 MG tablet Take 1 tablet (400 mg total) by mouth 2 (two) times daily.  Marland Kitchen albuterol (PROVENTIL HFA;VENTOLIN HFA) 108 (90 Base) MCG/ACT inhaler Inhale 2 puffs into the lungs every 6 (six) hours as needed for wheezing or shortness of breath.  Marland Kitchen amiodarone (PACERONE) 200 MG tablet Take 1/2 (one-half) tablet by mouth twice daily (Patient taking differently: Take 100 mg by mouth 2 (two) times daily. )  . apixaban (ELIQUIS) 5 MG TABS tablet Take 5 mg by mouth 2 (two) times daily.  Marland Kitchen  atorvastatin (LIPITOR) 40 MG tablet Take 40 mg by mouth daily. Start back slowly with 20 mg working up on slow titration to 40mg  per Dr Rockey Situ.  . budesonide-formoterol (SYMBICORT) 160-4.5 MCG/ACT inhaler Inhale 2 puffs into the lungs 2 (two) times daily.  . cetirizine (ZYRTEC) 10 MG tablet Take 10 mg by mouth daily as needed for allergies.   . Coenzyme Q10 (COQ10) 200 MG CAPS Take 200 mg by mouth daily.  Marland Kitchen ezetimibe (ZETIA) 10 MG tablet Take 1 tablet (10 mg total) by mouth daily.  . furosemide (LASIX) 20 MG tablet Take 1 tablet (20 mg) by mouth twice daily as needed   . Krill Oil 350 MG CAPS Take 350 mg by mouth as needed.   . metoprolol tartrate (LOPRESSOR) 50 MG tablet Take 1 tablet (50 mg total) by mouth 2 (two) times daily.  . mirtazapine (REMERON) 15 MG tablet Take 15 mg by mouth at bedtime as needed (for panic associated with PTSD).   . Multiple Vitamin (MULTIVITAMIN WITH MINERALS) TABS tablet Take 1 tablet by mouth daily.  . nitroGLYCERIN (NITROSTAT) 0.4 MG SL tablet Place 1 tablet (0.4 mg total) under the tongue every 5 (five) minutes as needed for chest pain.  . traMADol (ULTRAM) 50 MG tablet Take 1 tablet (50 mg total) by mouth every 12 (twelve) hours as needed.  . venetoclax (VENCLEXTA) 100 MG TABS Take 100 mg by mouth daily.   No facility-administered encounter medications on file as of 05/14/2020.  Allergies (verified) Patient has no known allergies.   History: Past Medical History:  Diagnosis Date  . Anxiety   . Arthritis   . Atrial fibrillation ([place])    a. Dx 2013, recurred 02/2014, CHA2DS2VASc = 3 -->placed on Eliquis;  b. 02/2014 Echo: EF 50-55%, mid and apical anterior septum and mid and apical inf septum are abnl, mild to mod Ao sclerosis w/o AS.  Marland Kitchen Cancer associated pain   . Chicken pox   . Chronic lymphocytic leukemia ([place])    a. Dx 02/2014.  Marland Kitchen CLL (chronic lymphocytic leukemia) ([place])   . Complication of anesthesia    History of  PTSD--do not touch patient when  waking up from surgery.  Marland Kitchen COPD (chronic obstructive pulmonary disease) ([place])   . Coronary artery disease    a. 04/2009 CABG x 3 (LIMA->LAD, VG->OM1, VG->PDA);  b. 09/2009 Cath: occluded VG x 2 w/ patent LIMA and L->R collats. EF 55%, mild antlat HK;  c. 10/2011 MV: EF 53%, no isch/infarct-->low risk.  Marland Kitchen Dysrhythmia    hx of a-fib  . GERD (gastroesophageal reflux disease)    occasional  . History of chemotherapy 2015-2016  . HOH (hard of hearing)    Bilateral Hearing Aids  . Hypertension   . Myocardial infarction ([place]) 2010  . OSA on CPAP    USE C-PAP  . Presence of permanent cardiac pacemaker 2017  . PTSD (post-traumatic stress disorder)   . PTSD (post-traumatic stress disorder)   . Pure hypercholesterolemia   . Rheumatic fever 1959  . 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 TIA (transient ischemic attack) 11/02/2015   Past Surgical History:  Procedure Laterality Date  . ABDOMINAL HERNIA REPAIR    . APPENDECTOMY  06/21/1985  . CARDIAC CATHETERIZATION  2010; 2011   ; Dr Fletcher Anon  . CORONARY ARTERY BYPASS GRAFT  04/2009   "CABG X3"  . EP IMPLANTABLE DEVICE N/A 03/03/2016   Procedure: Pacemaker Implant;  Surgeon: Deboraha Sprang, MD;  Location: [facility];  Service: Cardiovascular;  Laterality: N/A;  . FOREIGN BODY REMOVAL  1968   "shrapnel in my tailbone"  . INGUINAL HERNIA REPAIR Right   . INSERT / REPLACE / REMOVE PACEMAKER    . JOINT REPLACEMENT Right 2018  . LAPAROSCOPIC CHOLECYSTECTOMY    . TONSILLECTOMY AND ADENOIDECTOMY  1956  . TOTAL HIP ARTHROPLASTY Right 10/22/2016   Procedure: TOTAL HIP ARTHROPLASTY;  Surgeon: Dereck Leep, MD;  Location: [facility];  Service: Orthopedics;  Laterality: Right;  . TOTAL HIP ARTHROPLASTY Left 11/04/2017   Procedure: TOTAL HIP ARTHROPLASTY;  Surgeon: Dereck Leep, MD;  Location: [facility];  Service: Orthopedics;  Laterality: Left;   Family History  Problem Relation Age of Onset  . Heart disease Mother   . Heart  attack Mother   . Coronary artery disease Other        family history   Social History   Socioeconomic History  . Marital status: Married    Spouse name: Not on file  . Number of children: Not on file  . Years of education: Not on file  . Highest education level: Not on file  Occupational History  . 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 Smoking status: Former Smoker    Packs/day: 1.00    Years: 40.00    Pack years: 40.00    Types: Cigarettes    Quit date:  07/21/2006    Years since quitting: 13.8  . Smokeless tobacco: Never Used  Vaping Use  . Vaping Use: Former  Substance and Sexual Activity  . Alcohol use: Not Currently  . Drug use: No  . Sexual activity: Not Currently  Other Topics Concern  . Not on file  Social History Narrative   Lives in [place] with wife. Dogs. Goats. Children - 4. Grandchildren - 7.      Worked - Sports coach, part time.   Diet - healthy   Exercise - very active      Service - [employer] - [place]   Social Determinants of Health   Financial Resource Strain: Low Risk   . Difficulty of Paying Living Expenses: Not hard at all  Food Insecurity: No Food Insecurity  . Worried About Charity fundraiser in the Last Year: Never true  . Ran Out of Food in the Last Year: Never true  Transportation Needs: No Transportation Needs  . Lack of Transportation (Medical): No  . Lack of Transportation (Non-Medical): No  Physical Activity:   . Days of Exercise per Week: Not on file  . Minutes of Exercise per Session: Not on file  Stress: No Stress Concern Present  . Feeling of Stress : Not at all  Social Connections: Unknown  . Frequency of Communication with Friends and Family: More than three times a week  . Frequency of Social Gatherings with Friends and Family: Not on file  . Attends Religious Services: Not on file  . Active Member of Clubs or Organizations: Not on file  . Attends  Archivist Meetings: Not on file  . Marital Status: Married    Tobacco Counseling Counseling given: Not Answered   Clinical Intake:  Pre-visit preparation completed: Yes           How often do you need to have someone help you when you read instructions, pamphlets, or other written materials from your doctor or pharmacy?: 1 - Never  Interpreter Needed?: No      Activities of Daily Living In your present state of health, do you have any difficulty performing the following activities: 05/14/2020 03/15/2020  Hearing? Tempie Donning  Vision? N N  Difficulty concentrating or making decisions? N N  Walking or climbing stairs? Y N  Comment Unsteady gait. Cane/walker in use. -  Dressing or bathing? N N  Doing errands, shopping? N N  Preparing Food and eating ? N -  Using the Toilet? N -  In the past six months, have you accidently leaked urine? N -  Do you have problems with loss of bowel control? N -  Managing your Medications? N -  Managing your Finances? N -  Housekeeping or managing your Housekeeping? N -  Some recent data might be hidden    Patient Care Team: Leone Haven, MD as PCP - General (Family Medicine) Rockey Situ, Kathlene November, MD as PCP - Cardiology (Cardiology) [name], MD as Consulting Physician (Cardiology) Cammie Sickle, MD as Medical Oncologist (Hematology and Oncology)  Indicate any recent Medical Services you may have received from other than [facility] providers in the past year (date may be approximate).     Assessment:   This is a routine wellness examination for [facility]  I connected with [place] today by telephone and verified that I am speaking with the correct person using two identifiers. Location patient: home Location provider: work Persons participating in the virtual visit: patient, Marine scientist.  I discussed the limitations, risks, security and privacy concerns of performing an evaluation and management service by telephone and  the availability of in person appointments. The patient expressed understanding and verbally consented to this telephonic visit.    Interactive audio and video telecommunications were attempted between this provider and patient, however failed, due to patient having technical difficulties OR patient did not have access to video capability.  We continued and completed visit with audio only.  Some vital signs may be absent or patient reported.   Hearing/Vision screen  Hearing Screening   125Hz  250Hz  500Hz  1000Hz  2000Hz  3000Hz  4000Hz  6000Hz  8000Hz   Right ear:           Left ear:           [facility]  H[facility] aid, bilateral  Vision Screening Comments: Followed by [facility]  Wears corrective lenses  Virtual visit  Dietary issues and exercise activities discussed: Current Exercise Habits: Home exercise routine, Type of exercise: walking, Intensity: Mild  Healthy diet Good water intake  Goals    . Maintain Healthy Lifestyle     Stay hydrated Healthy diet Stay active walking on the farm       Depression Screen PHQ 2/9 Scores 05/14/2020 01/17/2020 09/16/2019 05/13/2019 05/12/2019 12/31/2018 05/12/2018  PHQ - 2 Score 0 0 0 0 0 0 0  PHQ- 9 Score - - - - - 0 -    Fall Risk Fall Risk  05/14/2020 01/17/2020 09/16/2019 05/13/2019 05/12/2019  Falls in the past year? 0 0 0 0 0  Number falls in past yr: 0 0 0 0 -  Comment - - - - -  Injury with Fall? 0 - - - -  Follow up Falls evaluation completed Falls evaluation completed Falls evaluation completed Falls evaluation completed -   Handrails in use when climbing stairs? Yes Home free of loose throw rugs in walkways, pet beds, electrical cords, etc? Yes  Adequate lighting in your home to reduce risk of falls? Yes   ASSISTIVE DEVICES UTILIZED TO PREVENT FALLS: Use of a cane, walker or w/c? Yes , cane/walker.  TIMED UP AND GO: Was the test performed? No . Virtual visit.   Cognitive Function:  Patient is alert and oriented  x3.  Denies difficulty focusing, making decisions, memory loss.  Enjoys reading and playing brain challenging games on ipad.    6CIT Screen 05/12/2019 03/19/2018  What Year? 0 points 0 points  What month? 0 points 0 points  What time? 0 points 0 points  Count back from 20 0 points 0 points  Months in reverse 0 points 0 points  Repeat phrase 0 points 0 points  Total Score 0 0    Immunizations Immunization History  Administered Date(s) Administered  . Fluad Quad(high Dose 65+) 04/04/2020  . Influenza Split 05/21/2012  . Influenza, High Dose Seasonal PF 04/21/2018  . Influenza,inj,Quad PF,6+ Mos 03/16/2014, 06/20/2015, 04/27/2016  . PFIZER SARS-COV-2 Vaccination 09/15/2019, 10/11/2019, 04/17/2020  . Pneumococcal Conjugate-13 07/05/2015  . Pneumococcal Polysaccharide-23 01/07/2011  . Tdap 01/14/2018    Health Maintenance There are no preventive care reminders to display for this patient. Health Maintenance  Topic Date Due  . COLONOSCOPY  07/02/2022  . TETANUS/TDAP  01/15/2028  . INFLUENZA VACCINE  Completed  . COVID-19 Vaccine  Completed  . Hepatitis C Screening  Completed  . PNA vac Low Risk Adult  Completed    Dental Screening: Recommended annual dental exams for proper oral hygiene  Community Resource Referral /  Chronic Care Management: CRR required this visit?  No   CCM required this visit?  No      Plan:   Keep all routine maintenance appointments.   Follow up 07/18/20  I have personally reviewed and noted the following in the patient's chart:   . Medical and social history . Use of alcohol, tobacco or illicit drugs  . Current medications and supplements . Functional ability and status . Nutritional status . Physical activity . Advanced directives . List of other physicians . Hospitalizations, surgeries, and ER visits in previous 12 months . Vitals . Screenings to include cognitive, depression, and falls . Referrals and appointments  In addition, I  have reviewed and discussed with patient certain preventive protocols, quality metrics, and best practice recommendations. A written personalized care plan for preventive services as well as general preventive health recommendations were provided to patient via mychart.     Varney Biles, LPN   05/22/1116

## 2020-05-21 ENCOUNTER — Telehealth: Payer: Self-pay | Admitting: *Deleted

## 2020-05-21 DIAGNOSIS — C9110 Chronic lymphocytic leukemia of B-cell type not having achieved remission: Secondary | ICD-10-CM

## 2020-05-21 MED ORDER — ACYCLOVIR 400 MG PO TABS: 400.0000 mg | ORAL_TABLET | Freq: Two times a day (BID) | ORAL | 6 refills | Status: DC

## 2020-05-21 NOTE — Telephone Encounter (Signed)
Patient called and requested RF on acyclovir to be sent to [facility] in [place].  RF submitted as requested.

## 2020-05-28 ENCOUNTER — Telehealth: Payer: Self-pay | Admitting: *Deleted

## 2020-05-28 NOTE — Telephone Encounter (Signed)
Patient called to obtain RF on Acyclovir. I explained to patient that the prescription was sent to [address] / in [place] a week ago per his request. Patient had called [facility] and was told the script was not on file. I personally reached out and spoke to the [facility] pharmacist. I confirmed that script was received within the system last week 05/21/2020. Pharmacist said they were looking up the patient's last name of [name]. The pharmacist apologized for any inconvenience. Patient updated that script was available at his pharmacy.

## 2020-06-04 ENCOUNTER — Other Ambulatory Visit: Payer: Self-pay | Admitting: Internal Medicine

## 2020-06-07 ENCOUNTER — Other Ambulatory Visit: Payer: Self-pay

## 2020-06-07 ENCOUNTER — Encounter: Payer: Self-pay | Admitting: Internal Medicine

## 2020-06-07 ENCOUNTER — Inpatient Hospital Stay: Payer: Medicare HMO | Attending: Internal Medicine

## 2020-06-07 ENCOUNTER — Inpatient Hospital Stay: Payer: Medicare HMO | Admitting: Internal Medicine

## 2020-06-07 DIAGNOSIS — C9110 Chronic lymphocytic leukemia of B-cell type not having achieved remission: Secondary | ICD-10-CM | POA: Diagnosis not present

## 2020-06-07 DIAGNOSIS — I4891 Unspecified atrial fibrillation: Secondary | ICD-10-CM | POA: Insufficient documentation

## 2020-06-07 DIAGNOSIS — Z7901 Long term (current) use of anticoagulants: Secondary | ICD-10-CM | POA: Insufficient documentation

## 2020-06-07 DIAGNOSIS — D8989 Other specified disorders involving the immune mechanism, not elsewhere classified: Secondary | ICD-10-CM | POA: Diagnosis not present

## 2020-06-07 LAB — CBC WITH DIFFERENTIAL/PLATELET
Abs Immature Granulocytes: 0.03 10*3/uL (ref 0.00–0.07)
Basophils Absolute: 0 10*3/uL (ref 0.0–0.1)
Basophils Relative: 0 %
Eosinophils Absolute: 0 10*3/uL (ref 0.0–0.5)
Eosinophils Relative: 0 %
HCT: 35.8 % — ABNORMAL LOW (ref 39.0–52.0)
Hemoglobin: 12 g/dL — ABNORMAL LOW (ref 13.0–17.0)
Immature Granulocytes: 1 %
Lymphocytes Relative: 29 %
Lymphs Abs: 1.9 10*3/uL (ref 0.7–4.0)
MCH: 34.4 pg — ABNORMAL HIGH (ref 26.0–34.0)
MCHC: 33.5 g/dL (ref 30.0–36.0)
MCV: 102.6 fL — ABNORMAL HIGH (ref 80.0–100.0)
Monocytes Absolute: 0.9 10*3/uL (ref 0.1–1.0)
Monocytes Relative: 14 %
Neutro Abs: 3.7 10*3/uL (ref 1.7–7.7)
Neutrophils Relative %: 56 %
Platelets: 141 10*3/uL — ABNORMAL LOW (ref 150–400)
RBC: 3.49 MIL/uL — ABNORMAL LOW (ref 4.22–5.81)
RDW: 14 % (ref 11.5–15.5)
WBC: 6.5 10*3/uL (ref 4.0–10.5)
nRBC: 0 % (ref 0.0–0.2)

## 2020-06-07 LAB — COMPREHENSIVE METABOLIC PANEL
ALT: 19 U/L (ref 0–44)
AST: 18 U/L (ref 15–41)
Albumin: 4.1 g/dL (ref 3.5–5.0)
Alkaline Phosphatase: 89 U/L (ref 38–126)
Anion gap: 8 (ref 5–15)
BUN: 21 mg/dL (ref 8–23)
CO2: 27 mmol/L (ref 22–32)
Calcium: 9.1 mg/dL (ref 8.9–10.3)
Chloride: 105 mmol/L (ref 98–111)
Creatinine, Ser: 0.9 mg/dL (ref 0.61–1.24)
GFR, Estimated: >60 mL/min (ref >60)
Glucose, Bld: 107 mg/dL — ABNORMAL HIGH (ref 70–99)
Potassium: 4.6 mmol/L (ref 3.5–5.1)
Sodium: 140 mmol/L (ref 135–145)
Total Bilirubin: 1.2 mg/dL (ref 0.3–1.2)
Total Protein: 6.2 g/dL — ABNORMAL LOW (ref 6.5–8.1)

## 2020-06-07 LAB — LACTATE DEHYDROGENASE: LDH: 149 U/L (ref 98–192)

## 2020-06-07 MED ORDER — TRAMADOL HCL 50 MG PO TABS: 50.0000 mg | ORAL_TABLET | Freq: Two times a day (BID) | ORAL | 0 refills | Status: DC | PRN

## 2020-06-07 NOTE — Progress Notes (Signed)
[place] OFFICE PROGRESS NOTE  Patient Care Team: Leone Haven, MD as PCP - General (Family Medicine) Rockey Situ Kathlene November, MD as PCP - Cardiology (Cardiology) [name], MD as Consulting Physician (Cardiology) Cammie Sickle, MD as Medical Oncologist (Hematology and Oncology)  Cancer Staging No matching staging information was found for the patient.   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  MARCH 2017- CT N/C/A/P- NED. STOP Ritux; surveillance   # AUG 2019- CT/PET- progression/NO transformation;  # NOV 2019- Progression; started Ibrutinib 420 mg/d. STOPPED in end of feb sec to extreme fatigue/joint pains/cramps  #November 01, 2018-start ibrutinib 280 mg a day; December 20, 2018-discontinue ibrutinib secondary multiple side effects.   #January 03, 2019 start [place] [July]; finished Dec 2020-; started [facility] maintenance [200 mg a day; DI-amiodarone]  #Mid March 2021-venetoclax dose reduced to 100 mg [second diarrhea].     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; IGVH- UN-MUTATED [poor prognosis]  SURVIVORSHIP: p  DIAGNOSIS: CLL   STAGE: IV  ;GOALS: control  CURRENT/MOST RECENT THERAPY : venetoclax     CLL (chronic lymphocytic leukemia) ([place])  01/03/2019 -  Chemotherapy   The patient had obinutuzumab (GAZYVA) 100 mg in sodium chloride 0.9 % 100 mL (0.9615 mg/mL) chemo infusion, 100 mg, Intravenous, Once, 6 of 6 cycles Administration: 100 mg (01/03/2019), 900 mg (01/04/2019), 1,000 mg (01/10/2019), 1,000 mg (01/31/2019), 1,000 mg (01/17/2019), 1,000 mg (02/28/2019), 1,000 mg (04/07/2019), 1,000 mg (05/05/2019), 1,000 mg (06/02/2019)  for  chemotherapy treatment.      INTERVAL HISTORY:  DEMARRION MEIKLEJOHN 74 y.o.  male CLL-high risk currently on venetoclax is here for follow-up.  In the interim patient has not had any recent infections.  No hospitalizations.  Continues to have chronic joint pains myalgias bilateral thighs.  Not any worse.  No nausea vomiting.  Chronic mild diarrhea.  No night sweats.  No new lumps or bumps.   \Review of Systems  Constitutional: Positive for malaise/fatigue. Negative for chills, diaphoresis and fever.  HENT: Negative for nosebleeds and sore throat.   Eyes: Negative for double vision.  Respiratory: Positive for cough. Negative for hemoptysis, shortness of breath and wheezing.   Cardiovascular: Negative for chest pain, palpitations, orthopnea and leg swelling.  Gastrointestinal: Negative for abdominal pain, blood in stool, constipation, heartburn, melena, nausea and vomiting.  Genitourinary: Negative for dysuria, frequency and urgency.  Musculoskeletal: Positive for back pain, joint pain and myalgias.  Skin: Negative.  Negative for itching and rash.  Neurological: Negative for dizziness, tingling, focal weakness and headaches.  Psychiatric/Behavioral: Negative for depression. The patient is not nervous/anxious and does not have insomnia.       PAST MEDICAL HISTORY :  Past Medical History:  Diagnosis Date  . Anxiety   . Arthritis   . Atrial fibrillation ([place])    a. Dx 2013, recurred 02/2014, CHA2DS2VASc = 3 -->placed on Eliquis;  b. 02/2014 Echo: EF 50-55%, mid and apical anterior septum and mid and apical inf septum are abnl, mild to mod Ao sclerosis w/o AS.  Marland Kitchen Cancer associated pain   . Chicken pox   . Chronic lymphocytic leukemia ([place])    a. Dx 02/2014.  Marland Kitchen  CLL (chronic lymphocytic leukemia) ([place])   . Complication of anesthesia    History of  PTSD--do not touch patient when waking up from surgery.  Marland Kitchen COPD (chronic obstructive pulmonary disease) ([place])   . Coronary artery disease    a.  04/2009 CABG x 3 (LIMA->LAD, VG->OM1, VG->PDA);  b. 09/2009 Cath: occluded VG x 2 w/ patent LIMA and L->R collats. EF 55%, mild antlat HK;  c. 10/2011 MV: EF 53%, no isch/infarct-->low risk.  Marland Kitchen Dysrhythmia    hx of a-fib  . GERD (gastroesophageal reflux disease)    occasional  . History of chemotherapy 2015-2016  . HOH (hard of hearing)    Bilateral Hearing Aids  . Hypertension   . Myocardial infarction ([place]) 2010  . OSA on CPAP    USE C-PAP  . Presence of permanent cardiac pacemaker 2017  . PTSD (post-traumatic stress disorder)   . PTSD (post-traumatic stress disorder)   . Pure hypercholesterolemia   . Rheumatic fever 1959  . 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 TIA (transient ischemic attack) 11/02/2015    PAST SURGICAL HISTORY :   Past Surgical History:  Procedure Laterality Date  . ABDOMINAL HERNIA REPAIR    . APPENDECTOMY  06/21/1985  . CARDIAC CATHETERIZATION  2010; 2011   ; Dr Fletcher Anon  . CORONARY ARTERY BYPASS GRAFT  04/2009   "CABG X3"  . EP IMPLANTABLE DEVICE N/A 03/03/2016   Procedure: Pacemaker Implant;  Surgeon: Deboraha Sprang, MD;  Location: [place] [facility];  Service: Cardiovascular;  Laterality: N/A;  . FOREIGN BODY REMOVAL  1968   "shrapnel in my tailbone"  . INGUINAL HERNIA REPAIR Right   . INSERT / REPLACE / REMOVE PACEMAKER    . JOINT REPLACEMENT Right 2018  . LAPAROSCOPIC CHOLECYSTECTOMY    . TONSILLECTOMY AND ADENOIDECTOMY  1956  . TOTAL HIP ARTHROPLASTY Right 10/22/2016   Procedure: TOTAL HIP ARTHROPLASTY;  Surgeon: Dereck Leep, MD;  Location: [facility];  Service: Orthopedics;  Laterality: Right;  . TOTAL HIP ARTHROPLASTY Left 11/04/2017   Procedure: TOTAL HIP ARTHROPLASTY;  Surgeon: Dereck Leep, MD;  Location: [facility];  Service: Orthopedics;  Laterality: Left;    FAMILY HISTORY :   Family History  Problem Relation Age of Onset  . Heart disease Mother   . Heart attack Mother   . Coronary artery disease Other        family  history    SOCIAL HISTORY:   Social History   Tobacco Use  . Smoking status: Former Smoker    Packs/day: 1.00    Years: 40.00    Pack years: 40.00    Types: Cigarettes    Quit date: 07/21/2006    Years since quitting: 13.9  . Smokeless tobacco: Never Used  Vaping Use  . Vaping Use: Former  Substance Use Topics  . Alcohol use: Not Currently  . Drug use: No    ALLERGIES:  has No Known Allergies.  MEDICATIONS:  Current Outpatient Medications  Medication Sig Dispense Refill  . acetaminophen (TYLENOL) 500 MG tablet Take 1,000 mg by mouth every 8 (eight) hours as needed for mild pain.    Marland Kitchen acyclovir (ZOVIRAX) 400 MG tablet Take 1 tablet (400 mg total) by mouth 2 (two) times daily. 60 tablet 6  . albuterol (PROVENTIL HFA;VENTOLIN HFA) 108 (90 Base) MCG/ACT inhaler Inhale 2 puffs into the lungs every 6 (six) hours as needed for wheezing or shortness of breath. 1 Inhaler 11  .  apixaban (ELIQUIS) 5 MG TABS tablet Take 5 mg by mouth 2 (two) times daily.    Marland Kitchen atorvastatin (LIPITOR) 40 MG tablet Take 40 mg by mouth daily. Start back slowly with 20 mg working up on slow titration to 40mg  per Dr Rockey Situ.    . budesonide-formoterol (SYMBICORT) 160-4.5 MCG/ACT inhaler Inhale 2 puffs into the lungs 2 (two) times daily. 1 Inhaler 11  . cetirizine (ZYRTEC) 10 MG tablet Take 10 mg by mouth daily as needed for allergies.     . Coenzyme Q10 (COQ10) 200 MG CAPS Take 200 mg by mouth daily.    Marland Kitchen ezetimibe (ZETIA) 10 MG tablet Take 1 tablet (10 mg total) by mouth daily. 90 tablet 3  . furosemide (LASIX) 20 MG tablet Take 1 tablet (20 mg) by mouth twice daily as needed     . Krill Oil 350 MG CAPS Take 350 mg by mouth as needed.     . metoprolol tartrate (LOPRESSOR) 50 MG tablet Take 1 tablet (50 mg total) by mouth 2 (two) times daily. 180 tablet 1  . mirtazapine (REMERON) 15 MG tablet Take 15 mg by mouth at bedtime as needed (for panic associated with PTSD).     . Multiple Vitamin (MULTIVITAMIN WITH  MINERALS) TABS tablet Take 1 tablet by mouth daily.    . nitroGLYCERIN (NITROSTAT) 0.4 MG SL tablet Place 1 tablet (0.4 mg total) under the tongue every 5 (five) minutes as needed for chest pain. 25 tablet 6  . traMADol (ULTRAM) 50 MG tablet Take 1 tablet (50 mg total) by mouth every 12 (twelve) hours as needed. 60 tablet 0  . VENCLEXTA 100 MG tablet TAKE 1 TABLET BY MOUTH ONCE DAILY 30 tablet 5  . amiodarone (PACERONE) 200 MG tablet Take 1/2 (one-half) tablet by mouth twice daily 90 tablet 3   No current facility-administered medications for this visit.    PHYSICAL EXAMINATION: ECOG PERFORMANCE STATUS: 1 - Symptomatic but completely ambulatory  BP (!) 153/80   Pulse 78   Temp (!) 97.2 F (36.2 C) (Tympanic)   Resp 18   Wt 221 lb (100.2 kg)   SpO2 98%   BMI 33.60 kg/m   Filed Weights   06/07/20 1035  Weight: 221 lb (100.2 kg)    Physical Exam Constitutional:      Comments: Patient is alone.  HENT:     Head: Normocephalic and atraumatic.     Mouth/Throat:     Pharynx: No oropharyngeal exudate.  Eyes:     Pupils: Pupils are equal, round, and reactive to light.  Cardiovascular:     Rate and Rhythm: Normal rate and regular rhythm.  Pulmonary:     Effort: No respiratory distress.     Breath sounds: No wheezing.  Abdominal:     General: Bowel sounds are normal. There is no distension.     Palpations: Abdomen is soft. There is no mass.     Tenderness: There is no abdominal tenderness. There is no guarding or rebound.  Musculoskeletal:        General: No tenderness. Normal range of motion.     Cervical back: Normal range of motion and neck supple.  Lymphadenopathy:     Comments: Resolved lymph nodes in the neck underarms.  Skin:    General: Skin is warm.  Neurological:     Mental Status: He is alert and oriented to person, place, and time.  Psychiatric:        Mood and Affect: Affect normal.  LABORATORY DATA:  I have reviewed the data as listed    Component  Value Date/Time   NA 140 06/07/2020 0942   NA 139 10/11/2014 1800   K 4.6 06/07/2020 0942   K 3.3 (L) 10/11/2014 1800   CL 105 06/07/2020 0942   CL 106 10/11/2014 1800   CO2 27 06/07/2020 0942   CO2 27 10/11/2014 1800   GLUCOSE 107 (H) 06/07/2020 0942   GLUCOSE 107 (H) 10/11/2014 1800   BUN 21 06/07/2020 0942   BUN 15 10/11/2014 1800   CREATININE 0.90 06/07/2020 0942   CREATININE 0.89 10/11/2014 1800   CALCIUM 9.1 06/07/2020 0942   CALCIUM 8.8 (L) 10/11/2014 1800   PROT 6.2 (L) 06/07/2020 0942   PROT 6.7 05/18/2017 1048   PROT 6.4 (L) 10/11/2014 1800   ALBUMIN 4.1 06/07/2020 0942   ALBUMIN 4.3 05/18/2017 1048   ALBUMIN 4.1 10/11/2014 1800   AST 18 06/07/2020 0942   AST 23 10/11/2014 1800   ALT 19 06/07/2020 0942   ALT 22 10/11/2014 1800   ALKPHOS 89 06/07/2020 0942   ALKPHOS 61 10/11/2014 1800   BILITOT 1.2 06/07/2020 0942   BILITOT 0.7 05/18/2017 1048   BILITOT 0.9 10/11/2014 1800   GFRNONAA >60 06/07/2020 0942   GFRNONAA >60 10/11/2014 1800   GFRAA >60 04/12/2020 0959   GFRAA >60 10/11/2014 1800    No results found for: SPEP, UPEP  Lab Results  Component Value Date   WBC 6.5 06/07/2020   NEUTROABS 3.7 06/07/2020   HGB 12.0 (L) 06/07/2020   HCT 35.8 (L) 06/07/2020   MCV 102.6 (H) 06/07/2020   PLT 141 (L) 06/07/2020      Chemistry      Component Value Date/Time   NA 140 06/07/2020 0942   NA 139 10/11/2014 1800   K 4.6 06/07/2020 0942   K 3.3 (L) 10/11/2014 1800   CL 105 06/07/2020 0942   CL 106 10/11/2014 1800   CO2 27 06/07/2020 0942   CO2 27 10/11/2014 1800   BUN 21 06/07/2020 0942   BUN 15 10/11/2014 1800   CREATININE 0.90 06/07/2020 0942   CREATININE 0.89 10/11/2014 1800      Component Value Date/Time   CALCIUM 9.1 06/07/2020 0942   CALCIUM 8.8 (L) 10/11/2014 1800   ALKPHOS 89 06/07/2020 0942   ALKPHOS 61 10/11/2014 1800   AST 18 06/07/2020 0942   AST 23 10/11/2014 1800   ALT 19 06/07/2020 0942   ALT 22 10/11/2014 1800   BILITOT 1.2  06/07/2020 0942   BILITOT 0.7 05/18/2017 1048   BILITOT 0.9 10/11/2014 1800       RADIOGRAPHIC STUDIES: I have personally reviewed the radiological images as listed and agreed with the findings in the report. No results found.   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  STABLE  AUG 4th 2021- CT-CR; STABLE.     # currently on Venoclax [00 mg sec to diarrhea; DI with Amio]. Labs today reviewed;  acceptable for treatment today. Will re-image in next 3-4 months  # A.fib-on amiodarone [drug interaction-amiodarone] sinus rhythm-on Eliquis-STABLE  # Muscle pain-sec to Ventoclax-G-1-2; monitor for now; -continue tramadol prn- STABLE.   #Low immunoglobulins IgG 330/recent severe respiratory infection/CLL-discussed regarding IVIG infusions.  Patient interested.  Discussed regarding the potential side effects-mainly infusion reactions.  We will start at next visit.  DISPOSITION: # follow up 4 weeks;  MD; labs- cbc/cmp/ldh; IVIG infusion [new] Dr.B    Orders Placed This Encounter  Procedures  . Comprehensive metabolic panel    Standing Status:   Future    Standing Expiration Date:   06/07/2021  . CBC with Differential    Standing Status:   Future    Standing Expiration Date:   06/07/2021  . Lactate dehydrogenase    Standing Status:   Future    Standing Expiration Date:   06/07/2021   All questions were answered. The patient knows to call the clinic with any problems, questions or concerns.      Cammie Sickle, MD 06/18/2020 8:30 AM

## 2020-06-07 NOTE — Assessment & Plan Note (Addendum)
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  STABLE  AUG 4th 2021- CT-CR; STABLE.     # currently on Venoclax [00 mg sec to diarrhea; DI with Amio]. Labs today reviewed;  acceptable for treatment today. Will re-image in next 3-4 months  # A.fib-on amiodarone [drug interaction-amiodarone] sinus rhythm-on Eliquis-STABLE  # Muscle pain-sec to Ventoclax-G-1-2; monitor for now; -continue tramadol prn- STABLE.   #Low immunoglobulins IgG 330/recent severe respiratory infection/CLL-discussed regarding IVIG infusions.  Patient interested.  Discussed regarding the potential side effects-mainly infusion reactions.  We will start at next visit.  DISPOSITION: # follow up 4 weeks;  MD; labs- cbc/cmp/ldh; IVIG infusion [new] Dr.B

## 2020-06-08 ENCOUNTER — Ambulatory Visit (INDEPENDENT_AMBULATORY_CARE_PROVIDER_SITE_OTHER): Payer: Medicare HMO

## 2020-06-08 DIAGNOSIS — I495 Sick sinus syndrome: Secondary | ICD-10-CM | POA: Diagnosis not present

## 2020-06-09 LAB — CUP PACEART REMOTE DEVICE CHECK
Battery Remaining Longevity: 66 mo
Battery Voltage: 3 V
Brady Statistic AP VP Percent: 0.46 %
Brady Statistic AP VS Percent: 97.42 %
Brady Statistic AS VP Percent: 0 %
Brady Statistic AS VS Percent: 2.12 %
Brady Statistic RA Percent Paced: 97.87 %
Brady Statistic RV Percent Paced: 0.49 %
Date Time Interrogation Session: 20211119200205
Implantable Lead Implant Date: 20170814
Implantable Lead Implant Date: 20170814
Implantable Lead Location: 753859
Implantable Lead Location: 753860
Implantable Lead Model: 5076
Implantable Lead Model: 5076
Implantable Pulse Generator Implant Date: 20170814
Lead Channel Impedance Value: 323 Ohm
Lead Channel Impedance Value: 437 Ohm
Lead Channel Impedance Value: 456 Ohm
Lead Channel Impedance Value: 532 Ohm
Lead Channel Pacing Threshold Amplitude: 0.875 V
Lead Channel Pacing Threshold Amplitude: 1 V
Lead Channel Pacing Threshold Pulse Width: 0.4 ms
Lead Channel Pacing Threshold Pulse Width: 0.4 ms
Lead Channel Sensing Intrinsic Amplitude: 19.875 mV
Lead Channel Sensing Intrinsic Amplitude: 19.875 mV
Lead Channel Sensing Intrinsic Amplitude: 4.125 mV
Lead Channel Sensing Intrinsic Amplitude: 4.125 mV
Lead Channel Setting Pacing Amplitude: 2 V
Lead Channel Setting Pacing Amplitude: 2.5 V
Lead Channel Setting Pacing Pulse Width: 0.4 ms
Lead Channel Setting Sensing Sensitivity: 0.9 mV

## 2020-06-10 NOTE — Progress Notes (Signed)
. Cardiology Office Note  Date:  06/12/2020   ID:  [name], DOB 03-17-1946, MRN [id number]  PCP:  Leone Haven, MD   Chief Complaint  Patient presents with  . Other    6 month follow up. Patient c.o SOB - COPD. Meds reviewed verbally with patient.     HPI:  [name] is a 74 year old gentleman with a history of  smoking, coronary artery disease,  cabg,  catheterization March 2011 with occluded vein graft 2, patent LIMA, collaterals from left to right,  atrial fibrillation March 2013, 10/11/2014. Converted to normal with metoprolol,  Possible conversion pause. 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  Ectatic abdominal aorta at risk for aneurysm development.    Echocardiogram 02/04/20 EF 55 to 60%, no RWMA, mild LVH, grade 1 diastolic dysfunction, RV normal size and function, mildly elevated PASP with RV systolic pressure 97.0 mmHg, mild MR, mild to moderate aortic valve sclerosis without stenosis.    Chronic leg pain, now on tramadol BID "from cancer medication"  Walks with  Cane No edema, on lasix PRN  Total chol 104 up to 264 without lipitor On zetia  No chest pain, Mild chronic SOB Agent orabge  No chest pain concerning for angina Has goats, active on his farm  EKG personally reviewed by myself on todays visit Shows normal sinus rhythm rate 64 bpm nonspecific ST-T wave abnormality  Other past medical history reviewed Reports long history of bad muscle pain in thighs, No better with  dexamethasone constant day and night,   Completed PT for 3 months with no relief Does not feel that his balance is much better  No palpitations concerning for atrial fib Chronic SOB walking up from barn, albuterol helps  finished cancer med infusion Plan for CT scan  Prior hematuria "a lot" WBC 252 eliquis held for a time Has PTSD, bad dreams He has seen psychiatry in the past which has helped  Left total hip on the right 10/22/16  fell off a ladder beginning of October 2017 CT [facility]: 04/25/2016 showing large hematoma Right gluteal intramuscular hematoma  measuring 9.6 x 6.8 x 4.9 cm [facility] stopped eliquis, statin (unclear why they stopped his statin)  Working with the [facility] for PTSD, reports feeling well, stable Reports that he is been compliant with his Lasix, taking 80 mg daily which is higher dose than before for the past 5 days, no improvement in his leg swelling  atrial fibrillation/flutter starting September 12 2015, then persistent flutter In the clinic his heart rate was 126 bpm Amiodarone dosing was increased, amlodipine was changed to diltiazem 120 mg daily, metoprolol increased up to 25 mill grams twice a day Converted to normal sinus rhythm at home  tachycardia concerning for atrial fibrillation 05/10/2015. Lasted only several minutes Rate possibly up to 140 bpm.  Previously has been very active, building a barn, tool shed, taking care of animals some benefits from the [place] for agent orange exposure and his cancer  Previous catheterization showing occluded LAD in the mid vessel, 60% left main,  99% distal RCA who was sent to [facility] in October 2010 for bypass surgery. He received 3 vessel bypass by Dr.Gerhardt with a LIMA to the LAD, vein graft to the OM1 and vein graft to the PDA, with subsequent cardiac catheter March 2011 for chest discomfort showing occluded vein grafts x2 with patent LIMA. He has collateral vessels from left to right. Ejection fraction  55% mild anterolateral hypokinesis.  PMH:   has a past medical history of Anxiety, Arthritis, Atrial fibrillation ([place]), Cancer associated pain, Chicken pox, Chronic lymphocytic leukemia ([place]), CLL (chronic lymphocytic leukemia) ([place]), Complication of anesthesia, COPD (chronic obstructive pulmonary disease) ([place]), Coronary artery disease, Dysrhythmia, GERD (gastroesophageal reflux disease), History of chemotherapy (2015-2016), HOH (hard of hearing), Hypertension, Myocardial infarction ([place]) (2010), OSA on CPAP, Presence of permanent cardiac pacemaker (2017), PTSD (post-traumatic stress disorder), PTSD (post-traumatic stress disorder), Pure hypercholesterolemia, Rheumatic fever (1959), 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  PSH:    Past Surgical History:  Procedure Laterality Date  . ABDOMINAL HERNIA REPAIR    . APPENDECTOMY  06/21/1985  . CARDIAC CATHETERIZATION  2010; 2011   ; Dr Fletcher Anon  . CORONARY ARTERY BYPASS GRAFT  04/2009   "CABG X3"  . EP IMPLANTABLE DEVICE N/A 03/03/2016   Procedure: Pacemaker Implant;  Surgeon: Deboraha Sprang, MD;  Location: [facility];  Service: Cardiovascular;  Laterality: N/A;  . FOREIGN BODY REMOVAL  1968   "shrapnel in my tailbone"  . INGUINAL HERNIA REPAIR Right   . INSERT / REPLACE / REMOVE PACEMAKER    . JOINT REPLACEMENT Right 2018  . LAPAROSCOPIC CHOLECYSTECTOMY    . TONSILLECTOMY AND ADENOIDECTOMY  1956  . TOTAL HIP ARTHROPLASTY Right 10/22/2016   Procedure: TOTAL HIP ARTHROPLASTY;  Surgeon: Dereck Leep, MD;  Location: [facility];  Service: Orthopedics;  Laterality: Right;  . TOTAL HIP ARTHROPLASTY Left 11/04/2017   Procedure: TOTAL HIP ARTHROPLASTY;  Surgeon: Dereck Leep, MD;  Location: [facility];  Service: Orthopedics;  Laterality: Left;    Current Outpatient Medications  Medication Sig Dispense Refill  . acetaminophen (TYLENOL) 500 MG tablet Take 1,000 mg by mouth every 8  (eight) hours as needed for mild pain.    Marland Kitchen acyclovir (ZOVIRAX) 400 MG tablet Take 1 tablet (400 mg total) by mouth 2 (two) times daily. 60 tablet 6  . albuterol (PROVENTIL HFA;VENTOLIN HFA) 108 (90 Base) MCG/ACT inhaler Inhale 2 puffs into the lungs every 6 (six) hours as needed for wheezing or shortness of breath. 1 Inhaler 11  . amiodarone (PACERONE) 200 MG tablet Take 1/2 (one-half) tablet by mouth twice daily 90 tablet 3  . apixaban (ELIQUIS) 5 MG TABS tablet Take 5 mg by mouth 2 (two) times daily.    Marland Kitchen atorvastatin (LIPITOR) 40 MG tablet Take 40 mg by mouth daily. Start back slowly with 20 mg working up on slow titration to 40mg  per Dr Rockey Situ.    . budesonide-formoterol (SYMBICORT) 160-4.5 MCG/ACT inhaler Inhale 2 puffs into the lungs 2 (two) times daily. 1 Inhaler 11  . cetirizine (ZYRTEC) 10 MG tablet Take 10 mg by mouth daily as needed for allergies.     . Coenzyme Q10 (COQ10) 200 MG CAPS Take 200 mg by mouth daily.    Marland Kitchen ezetimibe (ZETIA) 10 MG tablet Take 1 tablet (10 mg total) by mouth daily. 90 tablet 3  . furosemide (LASIX) 20 MG tablet Take 1 tablet (20 mg) by mouth twice daily as  needed     . Krill Oil 350 MG CAPS Take 350 mg by mouth as needed.     . metoprolol tartrate (LOPRESSOR) 50 MG tablet Take 1 tablet (50 mg total) by mouth 2 (two) times daily. 180 tablet 1  . mirtazapine (REMERON) 15 MG tablet Take 15 mg by mouth at bedtime as needed (for panic associated with PTSD).     . Multiple Vitamin (MULTIVITAMIN WITH MINERALS) TABS tablet Take 1 tablet by mouth daily.    . nitroGLYCERIN (NITROSTAT) 0.4 MG SL tablet Place 1 tablet (0.4 mg total) under the tongue every 5 (five) minutes as needed for chest pain. 25 tablet 6  . traMADol (ULTRAM) 50 MG tablet Take 1 tablet (50 mg total) by mouth every 12 (twelve) hours as needed. 60 tablet 0  . VENCLEXTA 100 MG tablet TAKE 1 TABLET BY MOUTH ONCE DAILY 30 tablet 5   No current facility-administered medications for this visit.      Allergies:   Patient has no known allergies.   Social History:  The patient  reports that he quit smoking about 13 years ago. His smoking use included cigarettes. He has a 40.00 pack-year smoking history. He has never used smokeless tobacco. He reports previous alcohol use. He reports that he does not use drugs.   Family History:   family history includes Coronary artery disease in an other family member; Heart attack in his mother; Heart disease in his mother.    Review of Systems: Review of Systems  Constitutional: Negative.   HENT: Negative.   Respiratory: Negative.   Cardiovascular: Negative.   Gastrointestinal: Negative.   Musculoskeletal: Negative.        Leg pain  Neurological: Negative.   Psychiatric/Behavioral: Negative.   All other systems reviewed and are negative.   PHYSICAL EXAM: VS:  BP 120/72 (BP Location: Left Arm, Patient Position: Sitting, Cuff Size: Normal)   Pulse 64   Ht 5\' 8"  (1.727 m)   Wt 221 lb (100.2 kg)   SpO2 98%   BMI 33.60 kg/m  , BMI Body mass index is 33.6 kg/m. Constitutional:  oriented to person, place, and time. No distress.  Presents in wheelchair HENT:  Head: Grossly normal Eyes:  no discharge. No scleral icterus.  Neck: No JVD, no carotid bruits  Cardiovascular: Regular rate and rhythm, no murmurs appreciated Pulmonary/Chest: Clear to auscultation bilaterally, no wheezes or rails Abdominal: Soft.  no distension.  no tenderness.  Musculoskeletal: Normal range of motion Neurological:  normal muscle tone. Coordination normal. No atrophy Skin: Skin warm and dry Psychiatric: normal affect, pleasant   Recent Labs: 08/04/2019: Magnesium 2.1 03/14/2020: B Natriuretic Peptide 93.9 06/07/2020: ALT 19; BUN 21; Creatinine, Ser 0.90; Hemoglobin 12.0; Platelets 141; Potassium 4.6; Sodium 140    Lipid Panel Lab Results  Component Value Date   CHOL 111 02/04/2020   HDL 36 (L) 02/04/2020   LDLCALC 56 02/04/2020   TRIG 95 02/04/2020       Wt Readings from Last 3 Encounters:  06/12/20 221 lb (100.2 kg)  06/07/20 221 lb (100.2 kg)  05/14/20 219 lb (99.3 kg)     ASSESSMENT AND PLAN: Atrial fibrillation, unspecified type (HCC)  Maintaining normal sinus rhythm, continue amiodarone, metoprolol  On eliquis, no bleeding  HYPERCHOLESTEROLEMIA Currently on zetia , lipitor Numbers good Does not appear to becontributing to leg pain  Essential hypertension Blood pressure is well controlled on today's visit. No changes made to the medications.  Atherosclerosis of coronary artery bypass  graft of native heart with angina pectoris with documented spasm (HCC) Currently with no symptoms of angina. No further workup at this time. Continue current medication regimen.  Chronic diastolic CHF (congestive heart failure) (HCC) Very rare lasix euvolemic  Centrilobular emphysema (HCC) Stable, SOB  Sinus pause  pacemaker placement, reports having occasional dropout or palpitation Pacer downloads reviewed, no significant arrhythmia  Leg pain No extremity Doppler fine, ABIs normal On tramadol   Total encounter time more than 25 minutes  Greater than 50% was spent in counseling and coordination of care with the patient    No orders of the defined types were placed in this encounter.    Signed, Esmond Plants, M.D., Ph.D. 06/12/2020  [place], [place]

## 2020-06-11 MED FILL — VENCLEXTA 100 MG TABS: 100 | 30 days supply | Qty: 30 | Fill #0

## 2020-06-11 NOTE — Progress Notes (Signed)
Remote pacemaker transmission.   

## 2020-06-12 ENCOUNTER — Ambulatory Visit: Payer: Medicare HMO | Admitting: Cardiovascular Disease

## 2020-06-12 ENCOUNTER — Encounter: Payer: Self-pay | Admitting: Cardiovascular Disease

## 2020-06-12 ENCOUNTER — Other Ambulatory Visit: Payer: Self-pay

## 2020-06-12 VITALS — BP 120/72 | HR 64 | Ht 68.0 in | Wt 221.0 lb

## 2020-06-12 DIAGNOSIS — I5032 Chronic diastolic (congestive) heart failure: Secondary | ICD-10-CM | POA: Diagnosis not present

## 2020-06-12 DIAGNOSIS — I480 Paroxysmal atrial fibrillation: Secondary | ICD-10-CM | POA: Diagnosis not present

## 2020-06-12 DIAGNOSIS — I739 Peripheral vascular disease, unspecified: Secondary | ICD-10-CM

## 2020-06-12 DIAGNOSIS — E782 Mixed hyperlipidemia: Secondary | ICD-10-CM

## 2020-06-12 DIAGNOSIS — I495 Sick sinus syndrome: Secondary | ICD-10-CM | POA: Diagnosis not present

## 2020-06-12 DIAGNOSIS — I10 Essential (primary) hypertension: Secondary | ICD-10-CM

## 2020-06-12 NOTE — Patient Instructions (Signed)
Medication Instructions:  No changes  If you need a refill on your cardiac medications before your next appointment, please call your pharmacy.    Lab work: No new labs needed   If you have labs (blood work) drawn today and your tests are completely normal, you will receive your results only by: . MyChart Message (if you have MyChart) OR . A paper copy in the mail If you have any lab test that is abnormal or we need to change your treatment, we will call you to review the results.   Testing/Procedures: No new testing needed   Follow-Up: At [facility], you and your health needs are our priority.  As part of our continuing mission to provide you with exceptional heart care, we have created designated Provider Care Teams.  These Care Teams include your primary Cardiologist (physician) and Advanced Practice Providers (APPs -  Physician Assistants and Nurse Practitioners) who all work together to provide you with the care you need, when you need it.  . You will need a follow up appointment in 6 months  . Providers on your designated Care Team:   . Christopher Berge, NP . Ryan Dunn, PA-C . Jacquelyn Visser, PA-C  Any Other Special Instructions Will Be Listed Below (If Applicable).  COVID-19 Vaccine Information can be found at: [email or web address] For questions related to vaccine distribution or appointments, please email [email or web address] or call [phone].     

## 2020-06-21 ENCOUNTER — Ambulatory Visit: Payer: No Typology Code available for payment source | Admitting: Internal Medicine

## 2020-06-21 ENCOUNTER — Other Ambulatory Visit: Payer: No Typology Code available for payment source

## 2020-07-05 ENCOUNTER — Encounter: Payer: Self-pay | Admitting: Internal Medicine

## 2020-07-05 ENCOUNTER — Inpatient Hospital Stay: Payer: Medicare HMO

## 2020-07-05 ENCOUNTER — Inpatient Hospital Stay: Payer: Medicare HMO | Attending: Internal Medicine

## 2020-07-05 ENCOUNTER — Inpatient Hospital Stay (HOSPITAL_BASED_OUTPATIENT_CLINIC_OR_DEPARTMENT_OTHER): Payer: Medicare HMO | Admitting: Internal Medicine

## 2020-07-05 VITALS — BP 126/52 | HR 60 | Temp 97.2°F | Resp 18

## 2020-07-05 DIAGNOSIS — C9110 Chronic lymphocytic leukemia of B-cell type not having achieved remission: Secondary | ICD-10-CM

## 2020-07-05 DIAGNOSIS — D801 Nonfamilial hypogammaglobulinemia: Secondary | ICD-10-CM | POA: Insufficient documentation

## 2020-07-05 DIAGNOSIS — E860 Dehydration: Secondary | ICD-10-CM

## 2020-07-05 DIAGNOSIS — D8989 Other specified disorders involving the immune mechanism, not elsewhere classified: Secondary | ICD-10-CM

## 2020-07-05 LAB — COMPREHENSIVE METABOLIC PANEL
ALT: 28 U/L (ref 0–44)
AST: 22 U/L (ref 15–41)
Albumin: 4 g/dL (ref 3.5–5.0)
Alkaline Phosphatase: 93 U/L (ref 38–126)
Anion gap: 8 (ref 5–15)
BUN: 18 mg/dL (ref 8–23)
CO2: 25 mmol/L (ref 22–32)
Calcium: 8.6 mg/dL — ABNORMAL LOW (ref 8.9–10.3)
Chloride: 105 mmol/L (ref 98–111)
Creatinine, Ser: 0.86 mg/dL (ref 0.61–1.24)
GFR, Estimated: >60 mL/min (ref >60)
Glucose, Bld: 111 mg/dL — ABNORMAL HIGH (ref 70–99)
Potassium: 3.9 mmol/L (ref 3.5–5.1)
Sodium: 138 mmol/L (ref 135–145)
Total Bilirubin: 1.2 mg/dL (ref 0.3–1.2)
Total Protein: 6.2 g/dL — ABNORMAL LOW (ref 6.5–8.1)

## 2020-07-05 LAB — CBC WITH DIFFERENTIAL/PLATELET
Abs Immature Granulocytes: 0.05 10*3/uL (ref 0.00–0.07)
Basophils Absolute: 0 10*3/uL (ref 0.0–0.1)
Basophils Relative: 0 %
Eosinophils Absolute: 0 10*3/uL (ref 0.0–0.5)
Eosinophils Relative: 0 %
HCT: 36.1 % — ABNORMAL LOW (ref 39.0–52.0)
Hemoglobin: 12.2 g/dL — ABNORMAL LOW (ref 13.0–17.0)
Immature Granulocytes: 1 %
Lymphocytes Relative: 32 %
Lymphs Abs: 2.2 10*3/uL (ref 0.7–4.0)
MCH: 33.9 pg (ref 26.0–34.0)
MCHC: 33.8 g/dL (ref 30.0–36.0)
MCV: 100.3 fL — ABNORMAL HIGH (ref 80.0–100.0)
Monocytes Absolute: 1.2 10*3/uL — ABNORMAL HIGH (ref 0.1–1.0)
Monocytes Relative: 17 %
Neutro Abs: 3.3 10*3/uL (ref 1.7–7.7)
Neutrophils Relative %: 50 %
Platelets: 150 10*3/uL (ref 150–400)
RBC: 3.6 MIL/uL — ABNORMAL LOW (ref 4.22–5.81)
RDW: 12.8 % (ref 11.5–15.5)
WBC: 6.8 10*3/uL (ref 4.0–10.5)
nRBC: 0 % (ref 0.0–0.2)

## 2020-07-05 LAB — LACTATE DEHYDROGENASE: LDH: 140 U/L (ref 98–192)

## 2020-07-05 MED ORDER — DIPHENHYDRAMINE HCL 25 MG PO TABS
25.0000 mg | ORAL_TABLET | Freq: Once | ORAL | Status: DC
  Filled 2020-07-05: qty 1

## 2020-07-05 MED ORDER — ACETAMINOPHEN 325 MG PO TABS
650.0000 mg | ORAL_TABLET | Freq: Once | ORAL | Status: AC
  Administered 2020-07-05: 650 mg via ORAL
  Filled 2020-07-05: qty 2

## 2020-07-05 MED ORDER — DIPHENHYDRAMINE HCL 25 MG PO CAPS
25.0000 mg | ORAL_CAPSULE | Freq: Once | ORAL | Status: AC
  Administered 2020-07-05: 25 mg via ORAL
  Filled 2020-07-05: qty 1

## 2020-07-05 MED ORDER — IMMUNE GLOBULIN (HUMAN) 10 GM/100ML IV SOLN
400.0000 mg/kg | Freq: Once | INTRAVENOUS | Status: AC
  Administered 2020-07-05: 40 g via INTRAVENOUS
  Filled 2020-07-05: qty 400

## 2020-07-05 MED ORDER — DEXTROSE 5 % IV SOLN
Freq: Once | INTRAVENOUS | Status: AC
  Filled 2020-07-05: qty 250

## 2020-07-05 NOTE — Progress Notes (Signed)
Having trouble with his balance. Did have a recent fall due to balance issues. Having more joint pain especially in his legs which he contributes to venclexta.

## 2020-07-05 NOTE — Progress Notes (Signed)
[place] OFFICE PROGRESS NOTE  Patient Care Team: Leone Haven, MD as PCP - General (Family Medicine) Rockey Situ Kathlene November, MD as PCP - Cardiology (Cardiology) [name], MD as Consulting Physician (Cardiology) Cammie Sickle, MD as Medical Oncologist (Hematology and Oncology)  Cancer Staging No matching staging information was found for the patient.   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  MARCH 2017- CT N/C/A/P- NED. STOP Ritux; surveillance   # AUG 2019- CT/PET- progression/NO transformation;  # NOV 2019- Progression; started Ibrutinib 420 mg/d. STOPPED in end of feb sec to extreme fatigue/joint pains/cramps  #November 01, 2018-start ibrutinib 280 mg a day; December 20, 2018-discontinue ibrutinib secondary multiple side effects.   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   #? 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; IGVH- UN-MUTATED [poor prognosis]  SURVIVORSHIP: p  DIAGNOSIS: CLL   STAGE: IV  ;GOALS: control  CURRENT/MOST RECENT THERAPY : venetoclax     CLL (chronic lymphocytic leukemia) ([place])  01/03/2019 -  Chemotherapy   The patient had obinutuzumab (GAZYVA) 100 mg in sodium chloride 0.9 % 100 mL (0.9615 mg/mL) chemo infusion, 100 mg, Intravenous, Once, 6 of 6 cycles Administration: 100 mg (01/03/2019), 900 mg (01/04/2019), 1,000 mg (01/10/2019),  1,000 mg (01/31/2019), 1,000 mg (01/17/2019), 1,000 mg (02/28/2019), 1,000 mg (04/07/2019), 1,000 mg (05/05/2019), 1,000 mg (06/02/2019)  for chemotherapy treatment.      INTERVAL HISTORY:  [name] 74 y.o.  male CLL-high risk currently on venetoclax is here for follow-up/proceed with IVIG for low immunoglobulins.  Patient denies any new onset of headaches nausea vomiting.  No night sweats with no new lumps or bumps.  Not had any hospitalizations.  Continues to have chronic joint pains/myalgias bilateral thighs.   \Review of Systems  Constitutional: Positive for malaise/fatigue. Negative for chills, diaphoresis and fever.  HENT: Negative for nosebleeds and sore throat.   Eyes: Negative for double vision.  Respiratory: Positive for cough. Negative for hemoptysis, shortness of breath and wheezing.   Cardiovascular: Negative for chest pain, palpitations, orthopnea and leg swelling.  Gastrointestinal: Negative for abdominal pain, blood in stool, constipation, heartburn, melena, nausea and vomiting.  Genitourinary: Negative for dysuria, frequency and urgency.  Musculoskeletal: Positive for back pain, joint pain and myalgias.  Skin: Negative.  Negative for itching and rash.  Neurological: Negative for dizziness, tingling, focal weakness and headaches.  Psychiatric/Behavioral: Negative for depression. The patient is not nervous/anxious and does not have insomnia.       PAST MEDICAL HISTORY :  Past Medical History:  Diagnosis Date  . Anxiety   . Arthritis   . Atrial fibrillation ([place])    a. Dx 2013, recurred 02/2014, CHA2DS2VASc = 3 -->placed on Eliquis;  b. 02/2014 Echo: EF 50-55%, mid and apical anterior septum and mid and apical inf septum are abnl, mild to mod Ao sclerosis w/o AS.  Marland Kitchen Cancer associated pain   . Chicken pox   .  Chronic lymphocytic leukemia ([place])    a. Dx 02/2014.  Marland Kitchen CLL (chronic lymphocytic leukemia) ([place])   . Complication of anesthesia    History of  PTSD--do not  touch patient when waking up from surgery.  Marland Kitchen COPD (chronic obstructive pulmonary disease) ([place])   . Coronary artery disease    a. 04/2009 CABG x 3 (LIMA->LAD, VG->OM1, VG->PDA);  b. 09/2009 Cath: occluded VG x 2 w/ patent LIMA and L->R collats. EF 55%, mild antlat HK;  c. 10/2011 MV: EF 53%, no isch/infarct-->low risk.  Marland Kitchen Dysrhythmia    hx of a-fib  . GERD (gastroesophageal reflux disease)    occasional  . History of chemotherapy 2015-2016  . HOH (hard of hearing)    Bilateral Hearing Aids  . Hypertension   . Myocardial infarction ([place]) 2010  . OSA on CPAP    USE C-PAP  . Presence of permanent cardiac pacemaker 2017  . PTSD (post-traumatic stress disorder)   . PTSD (post-traumatic stress disorder)   . Pure hypercholesterolemia   . Rheumatic fever 1959  . 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 TIA (transient ischemic attack) 11/02/2015    PAST SURGICAL HISTORY :   Past Surgical History:  Procedure Laterality Date  . ABDOMINAL HERNIA REPAIR    . APPENDECTOMY  06/21/1985  . CARDIAC CATHETERIZATION  2010; 2011   ; Dr Fletcher Anon  . CORONARY ARTERY BYPASS GRAFT  04/2009   "CABG X3"  . EP IMPLANTABLE DEVICE N/A 03/03/2016   Procedure: Pacemaker Implant;  Surgeon: Deboraha Sprang, MD;  Location: [facility];  Service: Cardiovascular;  Laterality: N/A;  . FOREIGN BODY REMOVAL  1968   "shrapnel in my tailbone"  . INGUINAL HERNIA REPAIR Right   . INSERT / REPLACE / REMOVE PACEMAKER    . JOINT REPLACEMENT Right 2018  . LAPAROSCOPIC CHOLECYSTECTOMY    . TONSILLECTOMY AND ADENOIDECTOMY  1956  . TOTAL HIP ARTHROPLASTY Right 10/22/2016   Procedure: TOTAL HIP ARTHROPLASTY;  Surgeon: Dereck Leep, MD;  Location: [facility];  Service: Orthopedics;  Laterality: Right;  . TOTAL HIP ARTHROPLASTY Left 11/04/2017   Procedure: TOTAL HIP ARTHROPLASTY;  Surgeon: Dereck Leep, MD;  Location: [facility];  Service: Orthopedics;  Laterality: Left;    FAMILY HISTORY :   Family History   Problem Relation Age of Onset  . Heart disease Mother   . Heart attack Mother   . Coronary artery disease Other        family history    SOCIAL HISTORY:   Social History   Tobacco Use  . Smoking status: Former Smoker    Packs/day: 1.00    Years: 40.00    Pack years: 40.00    Types: Cigarettes    Quit date: 07/21/2006    Years since quitting: 13.9  . Smokeless tobacco: Never Used  Vaping Use  . Vaping Use: Former  Substance Use Topics  . Alcohol use: Not Currently  . Drug use: No    ALLERGIES:  has No Known Allergies.  MEDICATIONS:  Current Outpatient Medications  Medication Sig Dispense Refill  . acetaminophen (TYLENOL) 500 MG tablet Take 1,000 mg by mouth every 8 (eight) hours as needed for mild pain.    Marland Kitchen acyclovir (ZOVIRAX) 400 MG tablet Take 1 tablet (400 mg total) by mouth 2 (two) times daily. 60 tablet 6  . albuterol (PROVENTIL HFA;VENTOLIN HFA) 108 (90 Base) MCG/ACT inhaler Inhale 2 puffs into the lungs every 6 (six) hours as  needed for wheezing or shortness of breath. 1 Inhaler 11  . amiodarone (PACERONE) 200 MG tablet Take 1/2 (one-half) tablet by mouth twice daily 90 tablet 3  . apixaban (ELIQUIS) 5 MG TABS tablet Take 5 mg by mouth 2 (two) times daily.    Marland Kitchen atorvastatin (LIPITOR) 40 MG tablet Take 40 mg by mouth daily. Start back slowly with 20 mg working up on slow titration to $RemoveBefo'40mg'ZlwmeJuaUai$  per Dr Rockey Situ.    . budesonide-formoterol (SYMBICORT) 160-4.5 MCG/ACT inhaler Inhale 2 puffs into the lungs 2 (two) times daily. 1 Inhaler 11  . cetirizine (ZYRTEC) 10 MG tablet Take 10 mg by mouth daily as needed for allergies.     . Coenzyme Q10 (COQ10) 200 MG CAPS Take 200 mg by mouth daily.    Marland Kitchen ezetimibe (ZETIA) 10 MG tablet Take 1 tablet (10 mg total) by mouth daily. 90 tablet 3  . furosemide (LASIX) 20 MG tablet Take 1 tablet (20 mg) by mouth twice daily as needed     . Krill Oil 350 MG CAPS Take 350 mg by mouth as needed.     . metoprolol tartrate (LOPRESSOR) 50 MG tablet  Take 1 tablet (50 mg total) by mouth 2 (two) times daily. 180 tablet 1  . mirtazapine (REMERON) 15 MG tablet Take 15 mg by mouth at bedtime as needed (for panic associated with PTSD).     . Multiple Vitamin (MULTIVITAMIN WITH MINERALS) TABS tablet Take 1 tablet by mouth daily.    . nitroGLYCERIN (NITROSTAT) 0.4 MG SL tablet Place 1 tablet (0.4 mg total) under the tongue every 5 (five) minutes as needed for chest pain. 25 tablet 6  . traMADol (ULTRAM) 50 MG tablet Take 1 tablet (50 mg total) by mouth every 12 (twelve) hours as needed. 60 tablet 0  . VENCLEXTA 100 MG tablet TAKE 1 TABLET BY MOUTH ONCE DAILY 30 tablet 5  . methylPREDNISolone (MEDROL DOSEPAK) 4 MG TBPK tablet Use as directed. 21 tablet 1   No current facility-administered medications for this visit.    PHYSICAL EXAMINATION: ECOG PERFORMANCE STATUS: 1 - Symptomatic but completely ambulatory  BP (!) 144/72 (BP Location: Left Arm, Patient Position: Sitting, Cuff Size: Large)   Pulse 62   Temp 98 F (36.7 C) (Tympanic)   Resp 18   Ht $R'5\' 8"'Xl$  (1.727 m)   Wt 219 lb (99.3 kg)   SpO2 98%   BMI 33.30 kg/m   Filed Weights   07/05/20 0833  Weight: 219 lb (99.3 kg)    Physical Exam Constitutional:      Comments: Patient is alone.  HENT:     Head: Normocephalic and atraumatic.     Mouth/Throat:     Pharynx: No oropharyngeal exudate.  Eyes:     Pupils: Pupils are equal, round, and reactive to light.  Cardiovascular:     Rate and Rhythm: Normal rate and regular rhythm.  Pulmonary:     Effort: No respiratory distress.     Breath sounds: No wheezing.  Abdominal:     General: Bowel sounds are normal. There is no distension.     Palpations: Abdomen is soft. There is no mass.     Tenderness: There is no abdominal tenderness. There is no guarding or rebound.  Musculoskeletal:        General: No tenderness. Normal range of motion.     Cervical back: Normal range of motion and neck supple.  Lymphadenopathy:     Comments:  Resolved lymph nodes in the  neck underarms.  Skin:    General: Skin is warm.  Neurological:     Mental Status: He is alert and oriented to person, place, and time.  Psychiatric:        Mood and Affect: Affect normal.     LABORATORY DATA:  I have reviewed the data as listed    Component Value Date/Time   NA 138 07/05/2020 0758   NA 139 10/11/2014 1800   K 3.9 07/05/2020 0758   K 3.3 (L) 10/11/2014 1800   CL 105 07/05/2020 0758   CL 106 10/11/2014 1800   CO2 25 07/05/2020 0758   CO2 27 10/11/2014 1800   GLUCOSE 111 (H) 07/05/2020 0758   GLUCOSE 107 (H) 10/11/2014 1800   BUN 18 07/05/2020 0758   BUN 15 10/11/2014 1800   CREATININE 0.86 07/05/2020 0758   CREATININE 0.89 10/11/2014 1800   CALCIUM 8.6 (L) 07/05/2020 0758   CALCIUM 8.8 (L) 10/11/2014 1800   PROT 6.2 (L) 07/05/2020 0758   PROT 6.7 05/18/2017 1048   PROT 6.4 (L) 10/11/2014 1800   ALBUMIN 4.0 07/05/2020 0758   ALBUMIN 4.3 05/18/2017 1048   ALBUMIN 4.1 10/11/2014 1800   AST 22 07/05/2020 0758   AST 23 10/11/2014 1800   ALT 28 07/05/2020 0758   ALT 22 10/11/2014 1800   ALKPHOS 93 07/05/2020 0758   ALKPHOS 61 10/11/2014 1800   BILITOT 1.2 07/05/2020 0758   BILITOT 0.7 05/18/2017 1048   BILITOT 0.9 10/11/2014 1800   GFRNONAA >60 07/05/2020 0758   GFRNONAA >60 10/11/2014 1800   GFRAA >60 04/12/2020 0959   GFRAA >60 10/11/2014 1800    No results found for: SPEP, UPEP  Lab Results  Component Value Date   WBC 6.8 07/05/2020   NEUTROABS 3.3 07/05/2020   HGB 12.2 (L) 07/05/2020   HCT 36.1 (L) 07/05/2020   MCV 100.3 (H) 07/05/2020   PLT 150 07/05/2020      Chemistry      Component Value Date/Time   NA 138 07/05/2020 0758   NA 139 10/11/2014 1800   K 3.9 07/05/2020 0758   K 3.3 (L) 10/11/2014 1800   CL 105 07/05/2020 0758   CL 106 10/11/2014 1800   CO2 25 07/05/2020 0758   CO2 27 10/11/2014 1800   BUN 18 07/05/2020 0758   BUN 15 10/11/2014 1800   CREATININE 0.86 07/05/2020 0758   CREATININE 0.89  10/11/2014 1800      Component Value Date/Time   CALCIUM 8.6 (L) 07/05/2020 0758   CALCIUM 8.8 (L) 10/11/2014 1800   ALKPHOS 93 07/05/2020 0758   ALKPHOS 61 10/11/2014 1800   AST 22 07/05/2020 0758   AST 23 10/11/2014 1800   ALT 28 07/05/2020 0758   ALT 22 10/11/2014 1800   BILITOT 1.2 07/05/2020 0758   BILITOT 0.7 05/18/2017 1048   BILITOT 0.9 10/11/2014 1800       RADIOGRAPHIC STUDIES: I have personally reviewed the radiological images as listed and agreed with the findings in the report. No results found.   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  STABLE  AUG 4th 2021- CT-CR; STABLE.   # currently on Venoclax [00 mg sec to diarrhea; DI with Amio]. Labs today reviewed;  acceptable for treatment today. Will re-image in next 2 months; will order at next visit. .   # A.fib-on amiodarone [drug interaction-amiodarone] sinus rhythm-on Eliquis-STABLE  #  Muscle pain-sec to Ventoclax-G-1-2; monitor for now; -continue tramadol prn- STABLE; ok with CBD oil.   #Low immunoglobulins IgG 330/recent severe respiratory infection/CLL-plan IVIG today-400 mg/kg x 1 today.   DISPOSITION: # IVIG infusion # follow up 6 weeks;  MD; labs- cbc/cmp/ldh; IVIG infusion-Dr.B  Addendum: Patient called the following day complaining of headache; likely secondary to IVIG.  Recommend NSAIDs; called in a prescription for Medrol Dosepak.    Orders Placed This Encounter  Procedures  . CBC with Differential/Platelet    Standing Status:   Future    Standing Expiration Date:   07/05/2021  . Comprehensive metabolic panel    Standing Status:   Future    Standing Expiration Date:   07/05/2021  . Lactate dehydrogenase    Standing Status:   Future    Standing Expiration Date:   07/05/2021   All questions were answered. The patient knows to call the clinic with any  problems, questions or concerns.      Cammie Sickle, MD 07/08/2020 9:12 PM

## 2020-07-05 NOTE — Assessment & Plan Note (Addendum)
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  STABLE  AUG 4th 2021- CT-CR; STABLE.   # currently on Venoclax [00 mg sec to diarrhea; DI with Amio]. Labs today reviewed;  acceptable for treatment today. Will re-image in next 2 months; will order at next visit. .   # A.fib-on amiodarone [drug interaction-amiodarone] sinus rhythm-on Eliquis-STABLE  # Muscle pain-sec to Ventoclax-G-1-2; monitor for now; -continue tramadol prn- STABLE; ok with CBD oil.   #Low immunoglobulins IgG 330/recent severe respiratory infection/CLL-plan IVIG today-400 mg/kg x 1 today.   DISPOSITION: # IVIG infusion # follow up 6 weeks;  MD; labs- cbc/cmp/ldh; IVIG infusion-Dr.B  Addendum: Patient called the following day complaining of headache; likely secondary to IVIG.  Recommend NSAIDs; called in a prescription for Medrol Dosepak.

## 2020-07-05 NOTE — Progress Notes (Signed)
Pt tolerated infusion well. No s/s of distress or reaction noted. Pt and VS stable at discharge.  

## 2020-07-06 ENCOUNTER — Telehealth: Payer: Self-pay | Admitting: *Deleted

## 2020-07-06 ENCOUNTER — Other Ambulatory Visit: Payer: Self-pay | Admitting: Internal Medicine

## 2020-07-06 MED ORDER — METHYLPREDNISOLONE 4 MG PO TBPK: ORAL_TABLET | ORAL | 1 refills | Status: DC

## 2020-07-06 NOTE — Telephone Encounter (Signed)
Md spoke with patient - see Dr. Sharmaine Base phone note for recommendations.

## 2020-07-06 NOTE — Telephone Encounter (Signed)
Patient called to [facility] RN to report severe headache and elevated bp today. He has taking Tylenol and Tramadol for the headache and has no relief. bp 167/85. Pt waited for 30 mins and rechecked it. bp 144/79.

## 2020-07-06 NOTE — Progress Notes (Signed)
Spoke to pt re: headaches; likely sec to IVIG: continue NSAIDs/tramadol; add medrol dose pack.   GB

## 2020-07-09 MED FILL — VENCLEXTA 100 MG TABS: 100 | 30 days supply | Qty: 30 | Fill #1

## 2020-07-17 ENCOUNTER — Other Ambulatory Visit: Payer: Self-pay

## 2020-07-17 ENCOUNTER — Encounter: Payer: Self-pay | Admitting: Family Medicine

## 2020-07-17 ENCOUNTER — Telehealth (INDEPENDENT_AMBULATORY_CARE_PROVIDER_SITE_OTHER): Payer: Medicare HMO | Admitting: Family Medicine

## 2020-07-17 DIAGNOSIS — C9110 Chronic lymphocytic leukemia of B-cell type not having achieved remission: Secondary | ICD-10-CM

## 2020-07-17 DIAGNOSIS — J432 Centrilobular emphysema: Secondary | ICD-10-CM | POA: Diagnosis not present

## 2020-07-17 DIAGNOSIS — I480 Paroxysmal atrial fibrillation: Secondary | ICD-10-CM | POA: Diagnosis not present

## 2020-07-17 DIAGNOSIS — E785 Hyperlipidemia, unspecified: Secondary | ICD-10-CM

## 2020-07-17 DIAGNOSIS — J310 Chronic rhinitis: Secondary | ICD-10-CM | POA: Insufficient documentation

## 2020-07-17 MED ORDER — AZELASTINE HCL 0.1 % NA SOLN: 2.0000 | Freq: Two times a day (BID) | NASAL | 3 refills | Status: DC

## 2020-07-17 MED ORDER — AMIODARONE HCL 200 MG PO TABS: ORAL_TABLET | ORAL | 1 refills | Status: DC

## 2020-07-17 NOTE — Progress Notes (Signed)
Virtual Visit via telephone Note  This visit type was conducted due to national recommendations for restrictions regarding the COVID-19 pandemic (e.g. social distancing).  This format is felt to be most appropriate for this patient at this time.  All issues noted in this document were discussed and addressed.  No physical exam was performed (except for noted visual exam findings with Video Visits).   I connected with [name] today at  9:00 AM EST by telephone and verified that I am speaking with the correct person using two identifiers. Location patient: home Location provider: home office Persons participating in the virtual visit: patient, provider  I discussed the limitations, risks, security and privacy concerns of performing an evaluation and management service by telephone and the availability of in person appointments. I also discussed with the patient that there may be a patient responsible charge related to this service. The patient expressed understanding and agreed to proceed.  Interactive audio and video telecommunications were attempted between this provider and patient, however failed, due to patient having technical difficulties OR patient did not have access to video capability.  We continued and completed visit with audio only.   Reason for visit: f/u  HPI: COPD: Medication compliance- taking symbicort  Rescue inhaler use- occasional use if DOE Dyspnea- chronic and stable  Wheezing- only with extreme exercise  Cough- no  HYPERLIPIDEMIA Symptoms Chest pain on exertion:  no   Medications: Compliance- lipitor Right upper quadrant pain- no  Muscle aches- chronic related to oncology medication  Afib: taking metoprolol, amiodarone, and eliquis.  No palpitations.  He does bleed easily though his bleeding stops quickly.  He follows with cardiology.  Chronic rhinitis: Patient does take Zyrtec.  Continues to have runny nose with this.  He has tried Flonase in the past with  little benefit.  CLL: Follows with oncology.  Currently on Venclexta.  He notes this does cause muscle aches.  He notes muscle aches are better than they had been.  He uses a combination of tramadol and Tylenol with good benefit.   ROS: See pertinent positives and negatives per HPI.  Past Medical History:  Diagnosis Date  . Anxiety   . Arthritis   . Atrial fibrillation ([place])    a. Dx 2013, recurred 02/2014, CHA2DS2VASc = 3 -->placed on Eliquis;  b. 02/2014 Echo: EF 50-55%, mid and apical anterior septum and mid and apical inf septum are abnl, mild to mod Ao sclerosis w/o AS.  Marland Kitchen Cancer associated pain   . Chicken pox   . Chronic lymphocytic leukemia ([place])    a. Dx 02/2014.  Marland Kitchen CLL (chronic lymphocytic leukemia) ([place])   . Complication of anesthesia    History of  PTSD--do not touch patient when waking up from surgery.  Marland Kitchen COPD (chronic obstructive pulmonary disease) ([place])   . Coronary artery disease    a. 04/2009 CABG x 3 (LIMA->LAD, VG->OM1, VG->PDA);  b. 09/2009 Cath: occluded VG x 2 w/ patent LIMA and L->R collats. EF 55%, mild antlat HK;  c. 10/2011 MV: EF 53%, no isch/infarct-->low risk.  Marland Kitchen Dysrhythmia    hx of a-fib  . GERD (gastroesophageal reflux disease)    occasional  . History of chemotherapy 2015-2016  . HOH (hard of hearing)    Bilateral Hearing Aids  . Hypertension   . Myocardial infarction ([place]) 2010  . OSA on CPAP    USE C-PAP  . Presence of permanent cardiac pacemaker 2017  . PTSD (post-traumatic stress disorder)   .  PTSD (post-traumatic stress disorder)   . Pure hypercholesterolemia   . Rheumatic fever 1959  . 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 TIA (transient ischemic attack) 11/02/2015    Past Surgical History:  Procedure Laterality Date  . ABDOMINAL HERNIA REPAIR    . APPENDECTOMY  06/21/1985  . CARDIAC CATHETERIZATION  2010; 2011   ; Dr Kirke Corin  . CORONARY ARTERY BYPASS GRAFT  04/2009   "CABG X3"  . EP IMPLANTABLE DEVICE N/A 03/03/2016    Procedure: Pacemaker Implant;  Surgeon: Duke Salvia, MD;  Location: [facility] INVASIVE CV LAB;  Service: Cardiovascular;  Laterality: N/A;  . FOREIGN BODY REMOVAL  1968   "shrapnel in my tailbone"  . INGUINAL HERNIA REPAIR Right   . INSERT / REPLACE / REMOVE PACEMAKER    . JOINT REPLACEMENT Right 2018  . LAPAROSCOPIC CHOLECYSTECTOMY    . TONSILLECTOMY AND ADENOIDECTOMY  1956  . TOTAL HIP ARTHROPLASTY Right 10/22/2016   Procedure: TOTAL HIP ARTHROPLASTY;  Surgeon: Donato Heinz, MD;  Location: [facility] ORS;  Service: Orthopedics;  Laterality: Right;  . TOTAL HIP ARTHROPLASTY Left 11/04/2017   Procedure: TOTAL HIP ARTHROPLASTY;  Surgeon: Donato Heinz, MD;  Location: [facility] ORS;  Service: Orthopedics;  Laterality: Left;    Family History  Problem Relation Age of Onset  . Heart disease Mother   . Heart attack Mother   . Coronary artery disease Other        family history    SOCIAL HX: Former smoker   Current Outpatient Medications:  .  acetaminophen (TYLENOL) 500 MG tablet, Take 1,000 mg by mouth every 8 (eight) hours as needed for mild pain., Disp: , Rfl:  .  acyclovir (ZOVIRAX) 400 MG tablet, Take 1 tablet (400 mg total) by mouth 2 (two) times daily., Disp: 60 tablet, Rfl: 6 .  albuterol (PROVENTIL HFA;VENTOLIN HFA) 108 (90 Base) MCG/ACT inhaler, Inhale 2 puffs into the lungs every 6 (six) hours as needed for wheezing or shortness of breath., Disp: 1 Inhaler, Rfl: 11 .  amiodarone (PACERONE) 200 MG tablet, Take 1/2 (one-half) tablet by mouth twice daily, Disp: 90 tablet, Rfl: 3 .  apixaban (ELIQUIS) 5 MG TABS tablet, Take 5 mg by mouth 2 (two) times daily., Disp: , Rfl:  .  atorvastatin (LIPITOR) 40 MG tablet, Take 40 mg by mouth daily. Start back slowly with 20 mg working up on slow titration to 40mg  per Dr ., Disp: , Rfl:  .  azelastine (ASTELIN) 0.1 % nasal spray, Place 2 sprays into both nostrils 2 (two) times daily. Use in each nostril as directed, Disp: 30 mL, Rfl: 3 .   budesonide-formoterol (SYMBICORT) 160-4.5 MCG/ACT inhaler, Inhale 2 puffs into the lungs 2 (two) times daily., Disp: 1 Inhaler, Rfl: 11 .  cetirizine (ZYRTEC) 10 MG tablet, Take 10 mg by mouth daily as needed for allergies. , Disp: , Rfl:  .  Coenzyme Q10 (COQ10) 200 MG CAPS, Take 200 mg by mouth daily., Disp: , Rfl:  .  ezetimibe (ZETIA) 10 MG tablet, Take 1 tablet (10 mg total) by mouth daily., Disp: 90 tablet, Rfl: 3 .  furosemide (LASIX) 20 MG tablet, Take 1 tablet (20 mg) by mouth twice daily as needed , Disp: , Rfl:  .  Krill Oil 350 MG CAPS, Take 350 mg by mouth as needed. , Disp: , Rfl:  .  methylPREDNISolone (MEDROL DOSEPAK) 4 MG TBPK tablet, Use as directed., Disp: 21 tablet, Rfl: 1 .  metoprolol tartrate (  LOPRESSOR) 50 MG tablet, Take 1 tablet (50 mg total) by mouth 2 (two) times daily., Disp: 180 tablet, Rfl: 1 .  mirtazapine (REMERON) 15 MG tablet, Take 15 mg by mouth at bedtime as needed (for panic associated with PTSD). , Disp: , Rfl:  .  Multiple Vitamin (MULTIVITAMIN WITH MINERALS) TABS tablet, Take 1 tablet by mouth daily., Disp: , Rfl:  .  nitroGLYCERIN (NITROSTAT) 0.4 MG SL tablet, Place 1 tablet (0.4 mg total) under the tongue every 5 (five) minutes as needed for chest pain., Disp: 25 tablet, Rfl: 6 .  traMADol (ULTRAM) 50 MG tablet, Take 1 tablet (50 mg total) by mouth every 12 (twelve) hours as needed., Disp: 60 tablet, Rfl: 0 .  VENCLEXTA 100 MG tablet, TAKE 1 TABLET BY MOUTH ONCE DAILY, Disp: 30 tablet, Rfl: 5  EXAM: This is a telephone visit and thus no physical exam was completed.  ASSESSMENT AND PLAN:  Discussed the following assessment and plan:  Problem List Items Addressed This Visit    Chronic rhinitis    Possibly allergic versus nonallergic rhinitis.  He will continue Zyrtec over-the-counter.  We will try Astelin 2 sprays each nostril twice daily to see if that provides any benefit.      Relevant Medications   azelastine (ASTELIN) 0.1 % nasal spray   CLL  (chronic lymphocytic leukemia) (H[facility])    Stable.  He will continue to see oncology.      COPD (chronic obstructive pulmonary disease) ([facility])    Chronic issue.  Stable.  He will continue Symbicort 2 puffs twice daily.  He can continue as needed Adderall use.  He will let us know if he has any progression of symptoms.      Relevant Medications   azelastine (ASTELIN) 0.1 % nasal spray   Hyperlipidemia    Continue Lipitor 40 mg daily.  Most recent lipid panel well controlled.      Paroxysmal atrial fibrillation (H[facility])    Stable symptomatically.  He is due for a TSH given that he is on amiodarone.  We will see if we get this completed with his labs today in the cancer center      Relevant Orders   TSH       I discussed the assessment and treatment plan with the patient. The patient was provided an opportunity to ask questions and all were answered. The patient agreed with the plan and demonstrated an understanding of the instructions.   The patient was advised to call back or seek an in-person evaluation if the symptoms worsen or if the condition fails to improve as anticipated.  I provided 15 minutes of non-face-to-face time during this encounter.   [name], MD

## 2020-07-17 NOTE — Assessment & Plan Note (Signed)
Stable symptomatically.  He is due for a TSH given that he is on amiodarone.  We will see if we get this completed with his labs today in the cancer center

## 2020-07-17 NOTE — Assessment & Plan Note (Signed)
Continue Lipitor 40 mg daily.  Most recent lipid panel well controlled.

## 2020-07-17 NOTE — Progress Notes (Signed)
I called and left a message with the answering service to add another lab the TSH to the patients labs at the cancer center.  Joaquin Knebel,cma

## 2020-07-17 NOTE — Assessment & Plan Note (Addendum)
Possibly allergic versus nonallergic rhinitis.  He will continue Zyrtec over-the-counter.  We will try Astelin 2 sprays each nostril twice daily to see if that provides any benefit.

## 2020-07-17 NOTE — Assessment & Plan Note (Signed)
Chronic issue.  Stable.  He will continue Symbicort 2 puffs twice daily.  He can continue as needed Adderall use.  He will let us know if he has any progression of symptoms.

## 2020-07-17 NOTE — Assessment & Plan Note (Signed)
Stable.  He will continue to see oncology. 

## 2020-07-17 NOTE — Telephone Encounter (Signed)
*  STAT* If patient is at the pharmacy, call can be transferred to refill team.   1. Which medications need to be refilled? (please list name of each medication and dose if known) Amiodarone  2. Which pharmacy/location (including street and city if local pharmacy) is medication to be sent to? [facility] [place]  3. Do they need a 30 day or 90 day supply? 90

## 2020-07-18 ENCOUNTER — Ambulatory Visit: Payer: Medicare HMO | Admitting: Family Medicine

## 2020-08-14 MED FILL — VENCLEXTA 100 MG TABS: 100 | 30 days supply | Qty: 30 | Fill #2

## 2020-08-15 ENCOUNTER — Other Ambulatory Visit: Payer: Self-pay | Admitting: *Deleted

## 2020-08-15 DIAGNOSIS — I480 Paroxysmal atrial fibrillation: Secondary | ICD-10-CM

## 2020-08-16 ENCOUNTER — Inpatient Hospital Stay: Payer: Medicare HMO | Attending: Internal Medicine

## 2020-08-16 ENCOUNTER — Inpatient Hospital Stay (HOSPITAL_BASED_OUTPATIENT_CLINIC_OR_DEPARTMENT_OTHER): Payer: Medicare HMO | Admitting: Internal Medicine

## 2020-08-16 ENCOUNTER — Inpatient Hospital Stay: Payer: Medicare HMO

## 2020-08-16 ENCOUNTER — Encounter: Payer: Self-pay | Admitting: Internal Medicine

## 2020-08-16 VITALS — BP 160/72 | HR 61 | Temp 98.3°F | Resp 16

## 2020-08-16 DIAGNOSIS — E860 Dehydration: Secondary | ICD-10-CM

## 2020-08-16 DIAGNOSIS — C9110 Chronic lymphocytic leukemia of B-cell type not having achieved remission: Secondary | ICD-10-CM

## 2020-08-16 DIAGNOSIS — D801 Nonfamilial hypogammaglobulinemia: Secondary | ICD-10-CM | POA: Insufficient documentation

## 2020-08-16 DIAGNOSIS — I480 Paroxysmal atrial fibrillation: Secondary | ICD-10-CM

## 2020-08-16 DIAGNOSIS — Z79899 Other long term (current) drug therapy: Secondary | ICD-10-CM | POA: Diagnosis not present

## 2020-08-16 LAB — CBC WITH DIFFERENTIAL/PLATELET
Abs Immature Granulocytes: 0.05 10*3/uL (ref 0.00–0.07)
Basophils Absolute: 0 10*3/uL (ref 0.0–0.1)
Basophils Relative: 0 %
Eosinophils Absolute: 0 10*3/uL (ref 0.0–0.5)
Eosinophils Relative: 0 %
HCT: 36.1 % — ABNORMAL LOW (ref 39.0–52.0)
Hemoglobin: 12.2 g/dL — ABNORMAL LOW (ref 13.0–17.0)
Immature Granulocytes: 1 %
Lymphocytes Relative: 32 %
Lymphs Abs: 2.1 10*3/uL (ref 0.7–4.0)
MCH: 33.6 pg (ref 26.0–34.0)
MCHC: 33.8 g/dL (ref 30.0–36.0)
MCV: 99.4 fL (ref 80.0–100.0)
Monocytes Absolute: 0.9 10*3/uL (ref 0.1–1.0)
Monocytes Relative: 14 %
Neutro Abs: 3.4 10*3/uL (ref 1.7–7.7)
Neutrophils Relative %: 53 %
Platelets: 124 10*3/uL — ABNORMAL LOW (ref 150–400)
RBC: 3.63 MIL/uL — ABNORMAL LOW (ref 4.22–5.81)
RDW: 13.4 % (ref 11.5–15.5)
WBC: 6.4 10*3/uL (ref 4.0–10.5)
nRBC: 0 % (ref 0.0–0.2)

## 2020-08-16 LAB — COMPREHENSIVE METABOLIC PANEL
ALT: 18 U/L (ref 0–44)
AST: 16 U/L (ref 15–41)
Albumin: 3.9 g/dL (ref 3.5–5.0)
Alkaline Phosphatase: 89 U/L (ref 38–126)
Anion gap: 6 (ref 5–15)
BUN: 17 mg/dL (ref 8–23)
CO2: 27 mmol/L (ref 22–32)
Calcium: 8.6 mg/dL — ABNORMAL LOW (ref 8.9–10.3)
Chloride: 104 mmol/L (ref 98–111)
Creatinine, Ser: 0.79 mg/dL (ref 0.61–1.24)
GFR, Estimated: >60 mL/min (ref >60)
Glucose, Bld: 108 mg/dL — ABNORMAL HIGH (ref 70–99)
Potassium: 3.7 mmol/L (ref 3.5–5.1)
Sodium: 137 mmol/L (ref 135–145)
Total Bilirubin: 1.4 mg/dL — ABNORMAL HIGH (ref 0.3–1.2)
Total Protein: 6.5 g/dL (ref 6.5–8.1)

## 2020-08-16 LAB — TSH: TSH: 3.125 u[IU]/mL (ref 0.350–4.500)

## 2020-08-16 LAB — LACTATE DEHYDROGENASE: LDH: 139 U/L (ref 98–192)

## 2020-08-16 MED ORDER — IMMUNE GLOBULIN (HUMAN) 10 GM/100ML IV SOLN
400.0000 mg/kg | Freq: Once | INTRAVENOUS | Status: AC
  Administered 2020-08-16: 40 g via INTRAVENOUS
  Filled 2020-08-16: qty 400

## 2020-08-16 MED ORDER — DIPHENHYDRAMINE HCL 25 MG PO TABS
25.0000 mg | ORAL_TABLET | Freq: Once | ORAL | Status: DC
  Filled 2020-08-16: qty 1

## 2020-08-16 MED ORDER — ACETAMINOPHEN 325 MG PO TABS
650.0000 mg | ORAL_TABLET | Freq: Once | ORAL | Status: AC
  Administered 2020-08-16: 650 mg via ORAL
  Filled 2020-08-16: qty 2

## 2020-08-16 MED ORDER — DIPHENHYDRAMINE HCL 25 MG PO CAPS
25.0000 mg | ORAL_CAPSULE | Freq: Once | ORAL | Status: AC
  Administered 2020-08-16: 25 mg via ORAL
  Filled 2020-08-16: qty 1

## 2020-08-16 MED ORDER — DEXTROSE 5 % IV SOLN
Freq: Once | INTRAVENOUS | Status: AC
  Filled 2020-08-16: qty 250

## 2020-08-16 NOTE — Assessment & Plan Note (Addendum)
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  STABLE  AUG 4th 2021- CT-CR; STABLE.   # currently on Venoclax [100 mg sec to diarrhea; DI with Amio]. Labs today reviewed;  acceptable for treatment today. Will order scan at next visit.   #Diarrhea 1 episode by loose stools yesterday-question related to venetoclax.  Recommend Imodium as needed.  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 Given headaches- add dexamethasone 10 mg IV pre-meds.   # A.fib-on amiodarone [drug interaction-amiodarone] sinus rhythm-on Eliquis-STABLE.   # Muscle pain-sec to Ventoclax-G-1-2; continue tramadol/CBD prn- STABLE.   DISPOSITION: # IVIG infusion # follow up 6 weeks;  MD; labs- cbc/cmp/ldh; IVIG infusion-Dr.B

## 2020-08-16 NOTE — Progress Notes (Signed)
[name] tolerated his IVIG infusion well today without any complications. Vitals stable throughout infusion and remained stable at discharge.

## 2020-08-16 NOTE — Addendum Note (Signed)
Addended by: Gloris Ham on: 08/16/2020 09:06 AM   Modules accepted: Orders

## 2020-08-16 NOTE — Progress Notes (Signed)
[place] OFFICE PROGRESS NOTE  Patient Care Team: Leone Haven, MD as PCP - General (Family Medicine) Rockey Situ Kathlene November, MD as PCP - Cardiology (Cardiology) [name], MD as Consulting Physician (Cardiology) Cammie Sickle, MD as Medical Oncologist (Hematology and Oncology)  Cancer Staging No matching staging information was found for the patient.   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  MARCH 2017- CT N/C/A/P- NED. STOP Ritux; surveillance   # AUG 2019- CT/PET- progression/NO transformation;  # NOV 2019- Progression; started Ibrutinib 420 mg/d. STOPPED in end of feb sec to extreme fatigue/joint pains/cramps  #November 01, 2018-start ibrutinib 280 mg a day; December 20, 2018-discontinue ibrutinib secondary multiple side effects.   #January 03, 2019 start Ruch [July]; finished Dec 2020-; started [facility] maintenance [200 mg a day; DI-amiodarone]  #Mid March 2021-venetoclax dose reduced to 100 mg [second diarrhea].   #? 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      INTERVAL HISTORY:  [name] 75 y.o.  male CLL-high risk currently on venetoclax is here for follow-up/ #2 VIG for low immunoglobulins.  Patient had episode of headache that lasted for a day or so post IVIG infusion with cycle #1.  Patient's headache improved post Medrol Dosepak.  He complains of loose stools up to 5 yesterday.  Denies abdominal pain.  Denies any new lumps or bumps.  No hospitalizations.  States his chronic joint pain pain is improved after using CBD oil.   \Review of Systems  Constitutional: Positive for malaise/fatigue. Negative for chills, diaphoresis and fever.  HENT: Negative for nosebleeds and sore throat.   Eyes: Negative for double vision.  Respiratory: Negative for hemoptysis, shortness of breath and wheezing.   Cardiovascular: Negative for chest pain, palpitations, orthopnea and leg swelling.  Gastrointestinal: Positive for diarrhea. Negative for abdominal pain, blood in stool, constipation, heartburn, melena, nausea and vomiting.  Genitourinary: Negative for dysuria, frequency and urgency.  Musculoskeletal: Positive for back pain, joint pain and myalgias.  Skin: Negative.  Negative for itching and rash.  Neurological: Negative for dizziness, tingling, focal weakness and headaches.  Psychiatric/Behavioral: Negative for depression. The patient is not nervous/anxious and does not have insomnia.       PAST MEDICAL HISTORY :  Past Medical History:  Diagnosis Date  . Anxiety   . Arthritis   . Atrial fibrillation ([place])    a. Dx 2013, recurred 02/2014, CHA2DS2VASc = 3 -->placed on Eliquis;  b. 02/2014 Echo: EF 50-55%, mid and apical  anterior septum and mid and apical inf septum are abnl, mild to mod Ao sclerosis w/o AS.  Marland Kitchen Cancer associated pain   . Chicken pox   . Chronic lymphocytic  leukemia ([place])    a. Dx 02/2014.  Marland Kitchen CLL (chronic lymphocytic leukemia) ([place])   . Complication of anesthesia    History of  PTSD--do not touch patient when waking up from surgery.  Marland Kitchen COPD (chronic obstructive pulmonary disease) ([place])   . Coronary artery disease    a. 04/2009 CABG x 3 (LIMA->LAD, VG->OM1, VG->PDA);  b. 09/2009 Cath: occluded VG x 2 w/ patent LIMA and L->R collats. EF 55%, mild antlat HK;  c. 10/2011 MV: EF 53%, no isch/infarct-->low risk.  Marland Kitchen Dysrhythmia    hx of a-fib  . GERD (gastroesophageal reflux disease)    occasional  . History of chemotherapy 2015-2016  . HOH (hard of hearing)    Bilateral Hearing Aids  . Hypertension   . Myocardial infarction ([place]) 2010  . OSA on CPAP    USE C-PAP  . Presence of permanent cardiac pacemaker 2017  . PTSD (post-traumatic stress disorder)   . PTSD (post-traumatic stress disorder)   . Pure hypercholesterolemia   . Rheumatic fever 1959  . 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 TIA (transient ischemic attack) 11/02/2015    PAST SURGICAL HISTORY :   Past Surgical History:  Procedure Laterality Date  . ABDOMINAL HERNIA REPAIR    . APPENDECTOMY  06/21/1985  . CARDIAC CATHETERIZATION  2010; 2011   ; Dr Fletcher Anon  . CORONARY ARTERY BYPASS GRAFT  04/2009   "CABG X3"  . EP IMPLANTABLE DEVICE N/A 03/03/2016   Procedure: Pacemaker Implant;  Surgeon: Deboraha Sprang, MD;  Location: [place] [facility];  Service: Cardiovascular;  Laterality: N/A;  . FOREIGN BODY REMOVAL  1968   "shrapnel in my tailbone"  . INGUINAL HERNIA REPAIR Right   . INSERT / REPLACE / REMOVE PACEMAKER    . JOINT REPLACEMENT Right 2018  . LAPAROSCOPIC CHOLECYSTECTOMY    . TONSILLECTOMY AND ADENOIDECTOMY  1956  . TOTAL HIP ARTHROPLASTY Right 10/22/2016   Procedure: TOTAL HIP ARTHROPLASTY;  Surgeon: Dereck Leep, MD;  Location: [facility];  Service: Orthopedics;  Laterality: Right;  . TOTAL HIP ARTHROPLASTY Left 11/04/2017   Procedure: TOTAL HIP  ARTHROPLASTY;  Surgeon: Dereck Leep, MD;  Location: [facility];  Service: Orthopedics;  Laterality: Left;    FAMILY HISTORY :   Family History  Problem Relation Age of Onset  . Heart disease Mother   . Heart attack Mother   . Coronary artery disease Other        family history    SOCIAL HISTORY:   Social History   Tobacco Use  . Smoking status: Former Smoker    Packs/day: 1.00    Years: 40.00    Pack years: 40.00    Types: Cigarettes    Quit date: 07/21/2006    Years since quitting: 14.0  . Smokeless tobacco: Never Used  Vaping Use  . Vaping Use: Former  Substance Use Topics  . Alcohol use: Not Currently  . Drug use: No    ALLERGIES:  has No Known Allergies.  MEDICATIONS:  Current Outpatient Medications  Medication Sig Dispense Refill  . acetaminophen (TYLENOL) 500 MG tablet Take 1,000 mg by mouth every 8 (eight) hours as needed for mild pain.    Marland Kitchen acyclovir (ZOVIRAX) 400 MG tablet Take 1 tablet (400 mg total) by  mouth 2 (two) times daily. 60 tablet 6  . albuterol (PROVENTIL HFA;VENTOLIN HFA) 108 (90 Base) MCG/ACT inhaler Inhale 2 puffs into the lungs every 6 (six) hours as needed for wheezing or shortness of breath. 1 Inhaler 11  . amiodarone (PACERONE) 200 MG tablet Take 1/2 (one-half) tablet by mouth twice daily 90 tablet 1  . apixaban (ELIQUIS) 5 MG TABS tablet Take 5 mg by mouth 2 (two) times daily.    Marland Kitchen atorvastatin (LIPITOR) 40 MG tablet Take 40 mg by mouth daily. Start back slowly with 20 mg working up on slow titration to $RemoveBefo'40mg'lRuAVllRlZe$  per Dr Rockey Situ.    Marland Kitchen azelastine (ASTELIN) 0.1 % nasal spray Place 2 sprays into both nostrils 2 (two) times daily. Use in each nostril as directed 30 mL 3  . budesonide-formoterol (SYMBICORT) 160-4.5 MCG/ACT inhaler Inhale 2 puffs into the lungs 2 (two) times daily. 1 Inhaler 11  . cetirizine (ZYRTEC) 10 MG tablet Take 10 mg by mouth daily as needed for allergies.     . Coenzyme Q10 (COQ10) 200 MG CAPS Take 200 mg by mouth daily.    Marland Kitchen  ezetimibe (ZETIA) 10 MG tablet Take 1 tablet (10 mg total) by mouth daily. 90 tablet 3  . furosemide (LASIX) 20 MG tablet Take 1 tablet (20 mg) by mouth twice daily as needed     . Krill Oil 350 MG CAPS Take 350 mg by mouth as needed.     . methylPREDNISolone (MEDROL DOSEPAK) 4 MG TBPK tablet Use as directed. 21 tablet 1  . mirtazapine (REMERON) 15 MG tablet Take 15 mg by mouth at bedtime as needed (for panic associated with PTSD).     . Multiple Vitamin (MULTIVITAMIN WITH MINERALS) TABS tablet Take 1 tablet by mouth daily.    . nitroGLYCERIN (NITROSTAT) 0.4 MG SL tablet Place 1 tablet (0.4 mg total) under the tongue every 5 (five) minutes as needed for chest pain. 25 tablet 6  . VENCLEXTA 100 MG tablet TAKE 1 TABLET BY MOUTH ONCE DAILY 30 tablet 5  . metoprolol tartrate (LOPRESSOR) 50 MG tablet Take 1 tablet (50 mg total) by mouth 2 (two) times daily. (Patient not taking: Reported on 08/16/2020) 180 tablet 1  . traMADol (ULTRAM) 50 MG tablet Take 1 tablet (50 mg total) by mouth every 12 (twelve) hours as needed. (Patient not taking: Reported on 08/16/2020) 60 tablet 0   No current facility-administered medications for this visit.    PHYSICAL EXAMINATION: ECOG PERFORMANCE STATUS: 1 - Symptomatic but completely ambulatory  BP (!) 149/63 (BP Location: Left Arm, Patient Position: Sitting, Cuff Size: Large)   Pulse 60   Temp 97.8 F (36.6 C) (Tympanic)   Resp 16   Ht $R'5\' 9"'TR$  (1.753 m)   Wt 223 lb (101.2 kg)   SpO2 100%   BMI 32.93 kg/m   Filed Weights   08/16/20 0828  Weight: 223 lb (101.2 kg)    Physical Exam Constitutional:      Comments: Patient is alone.  HENT:     Head: Normocephalic and atraumatic.     Mouth/Throat:     Pharynx: No oropharyngeal exudate.  Eyes:     Pupils: Pupils are equal, round, and reactive to light.  Cardiovascular:     Rate and Rhythm: Normal rate and regular rhythm.  Pulmonary:     Effort: No respiratory distress.     Breath sounds: No wheezing.   Abdominal:     General: Bowel sounds are normal. There is no distension.  Palpations: Abdomen is soft. There is no mass.     Tenderness: There is no abdominal tenderness. There is no guarding or rebound.  Musculoskeletal:        General: No tenderness. Normal range of motion.     Cervical back: Normal range of motion and neck supple.  Lymphadenopathy:     Comments: Resolved lymph nodes in the neck underarms.  Skin:    General: Skin is warm.  Neurological:     Mental Status: He is alert and oriented to person, place, and time.  Psychiatric:        Mood and Affect: Affect normal.     LABORATORY DATA:  I have reviewed the data as listed    Component Value Date/Time   NA 137 08/16/2020 0803   NA 139 10/11/2014 1800   K 3.7 08/16/2020 0803   K 3.3 (L) 10/11/2014 1800   CL 104 08/16/2020 0803   CL 106 10/11/2014 1800   CO2 27 08/16/2020 0803   CO2 27 10/11/2014 1800   GLUCOSE 108 (H) 08/16/2020 0803   GLUCOSE 107 (H) 10/11/2014 1800   BUN 17 08/16/2020 0803   BUN 15 10/11/2014 1800   CREATININE 0.79 08/16/2020 0803   CREATININE 0.89 10/11/2014 1800   CALCIUM 8.6 (L) 08/16/2020 0803   CALCIUM 8.8 (L) 10/11/2014 1800   PROT 6.5 08/16/2020 0803   PROT 6.7 05/18/2017 1048   PROT 6.4 (L) 10/11/2014 1800   ALBUMIN 3.9 08/16/2020 0803   ALBUMIN 4.3 05/18/2017 1048   ALBUMIN 4.1 10/11/2014 1800   AST 16 08/16/2020 0803   AST 23 10/11/2014 1800   ALT 18 08/16/2020 0803   ALT 22 10/11/2014 1800   ALKPHOS 89 08/16/2020 0803   ALKPHOS 61 10/11/2014 1800   BILITOT 1.4 (H) 08/16/2020 0803   BILITOT 0.7 05/18/2017 1048   BILITOT 0.9 10/11/2014 1800   GFRNONAA >60 08/16/2020 0803   GFRNONAA >60 10/11/2014 1800   GFRAA >60 04/12/2020 0959   GFRAA >60 10/11/2014 1800    No results found for: SPEP, UPEP  Lab Results  Component Value Date   WBC 6.4 08/16/2020   NEUTROABS 3.4 08/16/2020   HGB 12.2 (L) 08/16/2020   HCT 36.1 (L) 08/16/2020   MCV 99.4 08/16/2020   PLT 124  (L) 08/16/2020      Chemistry      Component Value Date/Time   NA 137 08/16/2020 0803   NA 139 10/11/2014 1800   K 3.7 08/16/2020 0803   K 3.3 (L) 10/11/2014 1800   CL 104 08/16/2020 0803   CL 106 10/11/2014 1800   CO2 27 08/16/2020 0803   CO2 27 10/11/2014 1800   BUN 17 08/16/2020 0803   BUN 15 10/11/2014 1800   CREATININE 0.79 08/16/2020 0803   CREATININE 0.89 10/11/2014 1800      Component Value Date/Time   CALCIUM 8.6 (L) 08/16/2020 0803   CALCIUM 8.8 (L) 10/11/2014 1800   ALKPHOS 89 08/16/2020 0803   ALKPHOS 61 10/11/2014 1800   AST 16 08/16/2020 0803   AST 23 10/11/2014 1800   ALT 18 08/16/2020 0803   ALT 22 10/11/2014 1800   BILITOT 1.4 (H) 08/16/2020 0803   BILITOT 0.7 05/18/2017 1048   BILITOT 0.9 10/11/2014 1800       RADIOGRAPHIC STUDIES: I have personally reviewed the radiological images as listed and agreed with the findings in the report. No results found.   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  STABLE  AUG 4th 2021- CT-CR; STABLE.   # currently on Venoclax [100 mg sec to diarrhea; DI with Amio]. Labs today reviewed;  acceptable for treatment today. Will order scan at next visit.   #Diarrhea 1 episode by loose stools yesterday-question related to venetoclax.  Recommend Imodium as needed.  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 Given headaches- add dexamethasone 10 mg IV pre-meds.   # A.fib-on amiodarone [drug interaction-amiodarone] sinus rhythm-on Eliquis-STABLE.   # Muscle pain-sec to Ventoclax-G-1-2; continue tramadol/CBD prn- STABLE.   DISPOSITION: # IVIG infusion # follow up 6 weeks;  MD; labs- cbc/cmp/ldh; IVIG infusion-Dr.B     No orders of the defined types were placed in this encounter.  All questions were answered. The  patient knows to call the clinic with any problems, questions or concerns.      Cammie Sickle, MD 08/16/2020 9:03 AM

## 2020-08-16 NOTE — Progress Notes (Signed)
Has had some diarrhea the last couple of days.

## 2020-09-07 ENCOUNTER — Ambulatory Visit (INDEPENDENT_AMBULATORY_CARE_PROVIDER_SITE_OTHER): Payer: Medicare HMO

## 2020-09-07 DIAGNOSIS — I495 Sick sinus syndrome: Secondary | ICD-10-CM | POA: Diagnosis not present

## 2020-09-07 LAB — CUP PACEART REMOTE DEVICE CHECK
Battery Remaining Longevity: 67 mo
Battery Voltage: 3 V
Brady Statistic AP VP Percent: 1.69 %
Brady Statistic AP VS Percent: 98.17 %
Brady Statistic AS VP Percent: 0 %
Brady Statistic AS VS Percent: 0.14 %
Brady Statistic RA Percent Paced: 99.85 %
Brady Statistic RV Percent Paced: 1.74 %
Date Time Interrogation Session: 20220218104525
Implantable Lead Implant Date: 20170814
Implantable Lead Implant Date: 20170814
Implantable Lead Location: 753859
Implantable Lead Location: 753860
Implantable Lead Model: 5076
Implantable Lead Model: 5076
Implantable Pulse Generator Implant Date: 20170814
Lead Channel Impedance Value: 342 Ohm
Lead Channel Impedance Value: 437 Ohm
Lead Channel Impedance Value: 494 Ohm
Lead Channel Impedance Value: 570 Ohm
Lead Channel Pacing Threshold Amplitude: 0.75 V
Lead Channel Pacing Threshold Amplitude: 0.75 V
Lead Channel Pacing Threshold Pulse Width: 0.4 ms
Lead Channel Pacing Threshold Pulse Width: 0.4 ms
Lead Channel Sensing Intrinsic Amplitude: 2.625 mV
Lead Channel Sensing Intrinsic Amplitude: 2.625 mV
Lead Channel Sensing Intrinsic Amplitude: 24.25 mV
Lead Channel Sensing Intrinsic Amplitude: 24.25 mV
Lead Channel Setting Pacing Amplitude: 2 V
Lead Channel Setting Pacing Amplitude: 2.5 V
Lead Channel Setting Pacing Pulse Width: 0.4 ms
Lead Channel Setting Sensing Sensitivity: 0.9 mV

## 2020-09-11 ENCOUNTER — Ambulatory Visit: Payer: Medicare HMO | Admitting: Internal Medicine

## 2020-09-11 ENCOUNTER — Other Ambulatory Visit: Payer: Self-pay

## 2020-09-11 ENCOUNTER — Encounter: Payer: Self-pay | Admitting: Internal Medicine

## 2020-09-11 VITALS — BP 138/74 | HR 63 | Temp 97.7°F | Ht 69.0 in | Wt 225.0 lb

## 2020-09-11 DIAGNOSIS — G4733 Obstructive sleep apnea (adult) (pediatric): Secondary | ICD-10-CM

## 2020-09-11 DIAGNOSIS — J449 Chronic obstructive pulmonary disease, unspecified: Secondary | ICD-10-CM

## 2020-09-11 NOTE — Progress Notes (Signed)
@Patient  ID: [name], male    DOB: 05/09/1946, 75 y.o.   MRN: [id number]  Chief Complaint  Patient presents with  . Follow-up    Wearing cpap 6hr nightly- pressure and mask is okay. C/o sob with exertion, non prod cough and wheezing.     SYNOPSIS 75 year old male, former smoker. PMH significant for COPD, OSA. Former patient of Dr. Alva Garnet, last seen 01/18/2018. Maintained on CPAP for sleep apnea. Continue Symbicort 160 and Spiriva.     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 Probable mild obstruction (FEV1 70% pred) with gas trapping (RV 134% pred) and mild-mod decrease in DLCO PSG (07/25/15): Overall AHI < 10/hr but > 100/hr in supine position. ROV (07/30/15): Dyspnea on exertion is moderately improved with scheduled use of Symbicort. He is also on Spiriva, PRN albuterol with rare use of albuterol MDI. PSG results not available. Continue same COPD regimen CPAP titration (08/29/15): recommend full mask with CPAP 8 cm H2O CPAP compliance 11/26-12/25/18: Usage 30/30. Greater than 4 hrs: 27/30.     CC Follow-up COPD Follow-up OSA     09/11/2020  Patient presents today for follow-up COPD Patient had no recent COPD exacerbations or bronchitis at this time He uses Symbicort and Spiriva daily Intermittent albuterol inhaler use  No exacerbation at this time No evidence of heart failure at this time No evidence or signs of infection at this time No respiratory distress No fevers, chills, nausea, vomiting, diarrhea No evidence of lower extremity edema No evidence hemoptysis    Follow-up OSA Gets about 5 to 6 hours of sleep per night Patient uses CPAP pressure setting at 4 Full facemask Patient with excellent compliance report AHI reduced to  1.6 CPAP 8 cm of water pressure 100% compliance for days 83% compliance for greater than 4 hours Sleep apnea was well controlled Patient uses and benefits from CPAP therapy  Patient with atrial fibrillation status post pacemaker Continue amiodarone therapy Continue oral anticoagulation therapy Recommend obtaining pulmonary function testing to assess for restrictive lung disease   No Known Allergies  Immunization History  Administered Date(s) Administered  . Fluad Quad(high Dose 65+) 04/04/2020  . Influenza Split 05/21/2012  . Influenza, High Dose Seasonal PF 04/21/2018  . Influenza,inj,Quad PF,6+ Mos 03/16/2014, 06/20/2015, 04/27/2016  . PFIZER(Purple Top)SARS-COV-2 Vaccination 09/15/2019, 10/11/2019, 04/17/2020  . Pneumococcal Conjugate-13 07/05/2015  . Pneumococcal Polysaccharide-23 01/07/2011  . Tdap 01/14/2018    Past Medical History:  Diagnosis Date  . Anxiety   . Arthritis   . Atrial fibrillation ([place])    a. Dx 2013, recurred 02/2014, CHA2DS2VASc = 3 -->placed on Eliquis;  b. 02/2014 Echo: EF 50-55%, mid and apical anterior septum and mid and apical inf septum are abnl, mild to mod Ao sclerosis w/o AS.  Marland Kitchen Cancer associated pain   . Chicken pox   . Chronic lymphocytic leukemia ([place])    a. Dx 02/2014.  Marland Kitchen CLL (chronic lymphocytic leukemia) ([place])   . Complication of anesthesia    History of  PTSD--do not touch patient when waking up from surgery.  Marland Kitchen COPD (chronic obstructive pulmonary disease) ([place])   . Coronary artery disease    a. 04/2009 CABG x 3 (LIMA->LAD, VG->OM1, VG->PDA);  b. 09/2009 Cath: occluded VG x 2 w/ patent LIMA  and L->R collats. EF 55%, mild antlat HK;  c. 10/2011 MV: EF 53%, no isch/infarct-->low risk.  Marland Kitchen Dysrhythmia    hx of a-fib  . GERD (gastroesophageal reflux disease)    occasional  . History of chemotherapy 2015-2016  . HOH (hard of hearing)    Bilateral Hearing Aids  . Hypertension   . Myocardial infarction ([place]) 2010  . OSA on CPAP    USE  C-PAP  . Presence of permanent cardiac pacemaker 2017  . PTSD (post-traumatic stress disorder)   . PTSD (post-traumatic stress disorder)   . Pure hypercholesterolemia   . Rheumatic fever 1959  . 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 TIA (transient ischemic attack) 11/02/2015    Tobacco History: Social History   Tobacco Use  Smoking Status Former Smoker  . Packs/day: 1.00  . Years: 40.00  . Pack years: 40.00  . Types: Cigarettes  . Quit date: 07/21/2006  . Years since quitting: 14.1  Smokeless Tobacco Never Used   Counseling given: Not Answered   Outpatient Medications Prior to Visit  Medication Sig Dispense Refill  . acetaminophen (TYLENOL) 500 MG tablet Take 1,000 mg by mouth every 8 (eight) hours as needed for mild pain.    Marland Kitchen acyclovir (ZOVIRAX) 400 MG tablet Take 1 tablet (400 mg total) by mouth 2 (two) times daily. 60 tablet 6  . albuterol (PROVENTIL HFA;VENTOLIN HFA) 108 (90 Base) MCG/ACT inhaler Inhale 2 puffs into the lungs every 6 (six) hours as needed for wheezing or shortness of breath. 1 Inhaler 11  . amiodarone (PACERONE) 200 MG tablet Take 1/2 (one-half) tablet by mouth twice daily 90 tablet 1  . apixaban (ELIQUIS) 5 MG TABS tablet Take 5 mg by mouth 2 (two) times daily.    Marland Kitchen atorvastatin (LIPITOR) 40 MG tablet Take 40 mg by mouth daily. Start back slowly with 20 mg working up on slow titration to 40mg  per Dr Rockey Situ.    Marland Kitchen azelastine (ASTELIN) 0.1 % nasal spray Place 2 sprays into both nostrils 2 (two) times daily. Use in each nostril as directed 30 mL 3  . budesonide-formoterol (SYMBICORT) 160-4.5 MCG/ACT inhaler Inhale 2 puffs into the lungs 2 (two) times daily. 1 Inhaler 11  . cetirizine (ZYRTEC) 10 MG tablet Take 10 mg by mouth daily as needed for allergies.     . Coenzyme Q10 (COQ10) 200 MG CAPS Take 200 mg by mouth daily.    Marland Kitchen ezetimibe (ZETIA) 10 MG tablet Take 1 tablet (10 mg total) by mouth daily. 90 tablet 3  . furosemide (LASIX) 20 MG tablet  Take 1 tablet (20 mg) by mouth twice daily as needed     . Krill Oil 350 MG CAPS Take 350 mg by mouth as needed.     . methylPREDNISolone (MEDROL DOSEPAK) 4 MG TBPK tablet Use as directed. 21 tablet 1  . metoprolol tartrate (LOPRESSOR) 50 MG tablet Take 1 tablet (50 mg total) by mouth 2 (two) times daily. (Patient not taking: Reported on 08/16/2020) 180 tablet 1  . mirtazapine (REMERON) 15 MG tablet Take 15 mg by mouth at bedtime as needed (for panic associated with PTSD).     . Multiple Vitamin (MULTIVITAMIN WITH MINERALS) TABS tablet Take 1 tablet by mouth daily.    . nitroGLYCERIN (NITROSTAT) 0.4 MG SL tablet Place 1 tablet (0.4 mg total) under the tongue every 5 (five) minutes as needed for chest pain. 25 tablet 6  . traMADol (ULTRAM) 50 MG tablet Take 1  tablet (50 mg total) by mouth every 12 (twelve) hours as needed. (Patient not taking: Reported on 08/16/2020) 60 tablet 0  . VENCLEXTA 100 MG tablet TAKE 1 TABLET BY MOUTH ONCE DAILY 30 tablet 5   No facility-administered medications prior to visit.    Review of Systems:  Gen:  Denies  fever, sweats, chills weight loss  HEENT: Denies blurred vision, double vision, ear pain, eye pain, hearing loss, nose bleeds, sore throat Cardiac:  No dizziness, chest pain or heaviness, chest tightness,edema, No JVD Resp:   + cough, -sputum production, +shortness of breath,-wheezing, -hemoptysis, +DOE Gi: Denies swallowing difficulty, stomach pain, nausea or vomiting, diarrhea, constipation, bowel incontinence Other:  All other systems negative       There were no vitals taken for this visit.  Physical Examination:   General Appearance: No distress  Neuro:without focal findings,  speech normal,  HEENT: PERRLA, EOM intact.   Pulmonary: normal breath sounds, No wheezing.  CardiovascularNormal S1,S2.  No m/r/g.   Abdomen: Benign, Soft, non-tender. Renal:  No costovertebral tenderness  GU:  Not performed at this time. Endoc: No evident  thyromegaly Skin:   warm, no rashes, no ecchymosis  Extremities: normal, no cyanosis, clubbing. PSYCHIATRIC: Mood, affect within normal limits.   ALL OTHER ROS ARE NEGATIVE       Lab Results:  CBC    Component Value Date/Time   WBC 6.4 08/16/2020 0803   RBC 3.63 (L) 08/16/2020 0803   HGB 12.2 (L) 08/16/2020 0803   HGB 12.8 (L) 10/24/2014 0837   HCT 36.1 (L) 08/16/2020 0803   HCT 37.7 (L) 10/24/2014 0837   PLT 124 (L) 08/16/2020 0803   PLT 136 (L) 10/24/2014 0837   MCV 99.4 08/16/2020 0803   MCV 95 10/24/2014 0837   MCH 33.6 08/16/2020 0803   MCHC 33.8 08/16/2020 0803   RDW 13.4 08/16/2020 0803   RDW 14.6 (H) 10/24/2014 0837   LYMPHSABS 2.1 08/16/2020 0803   LYMPHSABS 0.8 (L) 10/24/2014 0837   MONOABS 0.9 08/16/2020 0803   MONOABS 0.6 10/24/2014 0837   EOSABS 0.0 08/16/2020 0803   EOSABS 0.2 10/24/2014 0837   BASOSABS 0.0 08/16/2020 0803   BASOSABS 0.0 10/24/2014 0837    BMET    Component Value Date/Time   NA 137 08/16/2020 0803   NA 139 10/11/2014 1800   K 3.7 08/16/2020 0803   K 3.3 (L) 10/11/2014 1800   CL 104 08/16/2020 0803   CL 106 10/11/2014 1800   CO2 27 08/16/2020 0803   CO2 27 10/11/2014 1800   GLUCOSE 108 (H) 08/16/2020 0803   GLUCOSE 107 (H) 10/11/2014 1800   BUN 17 08/16/2020 0803   BUN 15 10/11/2014 1800   CREATININE 0.79 08/16/2020 0803   CREATININE 0.89 10/11/2014 1800   CALCIUM 8.6 (L) 08/16/2020 0803   CALCIUM 8.8 (L) 10/11/2014 1800   GFRNONAA >60 08/16/2020 0803   GFRNONAA >60 10/11/2014 1800   GFRAA >60 04/12/2020 0959   GFRAA >60 10/11/2014 1800      ASSESSMENT AND PLAN 75 year old male, former smoker. PMH significant for COPD, OSA. Former patient of Dr. Alva Garnet, last seen 01/18/2018. Maintained on CPAP for sleep apnea. Continue Symbicort 160 and Spiriva.   OSA on CPAP Patient has excellent compliance report with CPAP of 8 cm of water pressure Patient uses and benefits from CPAP therapy OSA is well controlled and  reduced Patient uses nasal pillows    COPD (chronic obstructive pulmonary disease)  Seems to be stable  at this time Will obtain pulmonary function testing to assess lung function No recent exacerbations Continue inhalers as prescribed with Symbicort and Spiriva    A. fib RVR with amiodarone therapy Recommend obtaining pulmonary function testing to assess for restrictive lung disease Follow-up cardiology as scheduled      COVID-19 EDUCATION: The signs and symptoms of COVID-19 were discussed with the patient and how to seek care for testing (follow up with PCP or arrange E-visit).  The importance of social distancing was discussed today.  MEDICATION ADJUSTMENTS/LABS AND TESTS ORDERED: Continue inhalers as prescribed Continue CPAP as prescribed Obtain PFTs  CURRENT MEDICATIONS REVIEWED AT LENGTH WITH PATIENT TODAY   Patient satisfied with Plan of action and management. All questions answered  Follow up 6 months  Total time spent 32 mins  Corrin Parker, M.D.  [facility]  Medical Director [place] Director [facility] Cardio-Pulmonary Department

## 2020-09-11 NOTE — Progress Notes (Signed)
Remote pacemaker transmission.   

## 2020-09-11 NOTE — Patient Instructions (Addendum)
Continue inhalers as prescribed Continue CPAP as prescribed  YOU GET AN A!!!! keep up the great work   Obtain breathing tests for amiodarone therapy

## 2020-09-12 MED FILL — VENCLEXTA 100 MG TABS: 100 | 30 days supply | Qty: 30 | Fill #3

## 2020-09-27 ENCOUNTER — Inpatient Hospital Stay: Payer: Medicare HMO | Attending: Internal Medicine

## 2020-09-27 ENCOUNTER — Encounter: Payer: Self-pay | Admitting: Internal Medicine

## 2020-09-27 ENCOUNTER — Inpatient Hospital Stay (HOSPITAL_BASED_OUTPATIENT_CLINIC_OR_DEPARTMENT_OTHER): Payer: Medicare HMO | Admitting: Internal Medicine

## 2020-09-27 ENCOUNTER — Inpatient Hospital Stay: Payer: Medicare HMO

## 2020-09-27 VITALS — BP 159/86 | HR 60 | Temp 97.2°F | Resp 16

## 2020-09-27 DIAGNOSIS — E860 Dehydration: Secondary | ICD-10-CM

## 2020-09-27 DIAGNOSIS — C9110 Chronic lymphocytic leukemia of B-cell type not having achieved remission: Secondary | ICD-10-CM

## 2020-09-27 LAB — COMPREHENSIVE METABOLIC PANEL
ALT: 21 U/L (ref 0–44)
AST: 22 U/L (ref 15–41)
Albumin: 3.9 g/dL (ref 3.5–5.0)
Alkaline Phosphatase: 88 U/L (ref 38–126)
Anion gap: 8 (ref 5–15)
BUN: 20 mg/dL (ref 8–23)
CO2: 25 mmol/L (ref 22–32)
Calcium: 8.9 mg/dL (ref 8.9–10.3)
Chloride: 110 mmol/L (ref 98–111)
Creatinine, Ser: 0.78 mg/dL (ref 0.61–1.24)
GFR, Estimated: >60 mL/min (ref >60)
Glucose, Bld: 121 mg/dL — ABNORMAL HIGH (ref 70–99)
Potassium: 3.6 mmol/L (ref 3.5–5.1)
Sodium: 143 mmol/L (ref 135–145)
Total Bilirubin: 1.3 mg/dL — ABNORMAL HIGH (ref 0.3–1.2)
Total Protein: 6.6 g/dL (ref 6.5–8.1)

## 2020-09-27 LAB — CBC WITH DIFFERENTIAL/PLATELET
Abs Immature Granulocytes: 0.02 10*3/uL (ref 0.00–0.07)
Basophils Absolute: 0 10*3/uL (ref 0.0–0.1)
Basophils Relative: 0 %
Eosinophils Absolute: 0 10*3/uL (ref 0.0–0.5)
Eosinophils Relative: 1 %
HCT: 36.6 % — ABNORMAL LOW (ref 39.0–52.0)
Hemoglobin: 12.3 g/dL — ABNORMAL LOW (ref 13.0–17.0)
Immature Granulocytes: 0 %
Lymphocytes Relative: 25 %
Lymphs Abs: 1.5 10*3/uL (ref 0.7–4.0)
MCH: 33.8 pg (ref 26.0–34.0)
MCHC: 33.6 g/dL (ref 30.0–36.0)
MCV: 100.5 fL — ABNORMAL HIGH (ref 80.0–100.0)
Monocytes Absolute: 0.8 10*3/uL (ref 0.1–1.0)
Monocytes Relative: 13 %
Neutro Abs: 3.8 10*3/uL (ref 1.7–7.7)
Neutrophils Relative %: 61 %
Platelets: 133 10*3/uL — ABNORMAL LOW (ref 150–400)
RBC: 3.64 MIL/uL — ABNORMAL LOW (ref 4.22–5.81)
RDW: 14.1 % (ref 11.5–15.5)
WBC: 6.2 10*3/uL (ref 4.0–10.5)
nRBC: 0 % (ref 0.0–0.2)

## 2020-09-27 LAB — LACTATE DEHYDROGENASE: LDH: 153 U/L (ref 98–192)

## 2020-09-27 MED ORDER — DEXTROSE 5 % IV SOLN
Freq: Once | INTRAVENOUS | Status: AC
  Filled 2020-09-27: qty 250

## 2020-09-27 MED ORDER — IMMUNE GLOBULIN (HUMAN) 10 GM/100ML IV SOLN
400.0000 mg/kg | Freq: Once | INTRAVENOUS | Status: AC
  Administered 2020-09-27: 40 g via INTRAVENOUS
  Filled 2020-09-27: qty 400

## 2020-09-27 MED ORDER — DIPHENHYDRAMINE HCL 25 MG PO CAPS
25.0000 mg | ORAL_CAPSULE | Freq: Once | ORAL | Status: AC
  Administered 2020-09-27: 25 mg via ORAL
  Filled 2020-09-27: qty 1

## 2020-09-27 MED ORDER — SODIUM CHLORIDE 0.9 % IV SOLN
10.0000 mg | Freq: Once | INTRAVENOUS | Status: AC
  Administered 2020-09-27: 10 mg via INTRAVENOUS
  Filled 2020-09-27: qty 10

## 2020-09-27 MED ORDER — ACETAMINOPHEN 325 MG PO TABS
650.0000 mg | ORAL_TABLET | Freq: Once | ORAL | Status: AC
  Administered 2020-09-27: 650 mg via ORAL
  Filled 2020-09-27: qty 2

## 2020-09-27 NOTE — Assessment & Plan Note (Addendum)
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  STABLE  AUG 4th 2021- CT-CR; STABLE.  # currently on Venoclax [100 mg sec to diarrhea; DI with Amio]. Labs today reviewed;  acceptable for treatment today. Will order scan today.   #Diarrhea-G-1 ? venatolclax- monitor for now.   #Low immunoglobulins IgG 330/recent severe respiratory infection/CLL-plan IVIG #3 today-400 mg/kg x 1 today. Headaches improved with dexamethasone 10 mg IV pre-meds.   # A.fib-on amiodarone [drug interaction-amiodarone] sinus rhythm-on Eliquis-STABLE.   # Muscle pain-sec to Ventoclax-G-1-2; continue tramadol/CBD prn- STABLE.   *gd-gaitlin DISPOSITION: # IVIG infusion # follow up week of April 28th  MD; labs- cbc/cmp/ldh; IVIG infusion; CT C/A/P--Dr.B

## 2020-09-27 NOTE — Progress Notes (Signed)
Feels like he is retaining fluid in abdomen. Having more SOB

## 2020-09-27 NOTE — Progress Notes (Signed)
[facility] OFFICE PROGRESS NOTE  Patient Care Team: Leone Haven, MD as PCP - General (Family Medicine) Rockey Situ Kathlene November, MD as PCP - Cardiology (Cardiology) [name], MD as Consulting Physician (Cardiology) Cammie Sickle, MD as Medical Oncologist (Hematology and Oncology)  Cancer Staging No matching staging information was found for the patient.   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  MARCH 2017- CT N/C/A/P- NED. STOP Ritux; surveillance   # AUG 2019- CT/PET- progression/NO transformation;  # NOV 2019- Progression; started Ibrutinib 420 mg/d. STOPPED in end of feb sec to extreme fatigue/joint pains/cramps  #November 01, 2018-start ibrutinib 280 mg a day; December 20, 2018-discontinue ibrutinib secondary multiple side effects.   #January 03, 2019 start [place] [July]; finished Dec 2020-; started [facility] maintenance [200 mg a day; DI-amiodarone]  #Mid March 2021-venetoclax dose reduced to 100 mg [second diarrhea].   #? 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; IGVH- UN-MUTATED [poor prognosis]  SURVIVORSHIP: p  DIAGNOSIS: CLL   STAGE: IV  ;GOALS: control  CURRENT/MOST RECENT THERAPY : venetoclax     CLL (chronic lymphocytic leukemia) ([place])  01/03/2019 -  Chemotherapy   The patient had obinutuzumab (GAZYVA) 100 mg in sodium chloride 0.9 % 100 mL (0.9615 mg/mL) chemo infusion, 100 mg, Intravenous, Once, 6 of 6 cycles Administration: 100 mg (01/03/2019), 900 mg (01/04/2019), 1,000 mg (01/10/2019),  1,000 mg (01/31/2019), 1,000 mg (01/17/2019), 1,000 mg (02/28/2019), 1,000 mg (04/07/2019), 1,000 mg (05/05/2019), 1,000 mg (06/02/2019)  for chemotherapy treatment.      INTERVAL HISTORY:  [name] 75 y.o.  male CLL-high risk currently on venetoclax is here for follow-up/ s/p IVIG for low immunoglobulins.  Denies any headaches post IVIG infusions.  Denies any abdominal pain.  Denies any new lumps or bumps.  Joint pains improved after using CBD oil.   \Review of Systems  Constitutional: Positive for malaise/fatigue. Negative for chills, diaphoresis and fever.  HENT: Negative for nosebleeds and sore throat.   Eyes: Negative for double vision.  Respiratory: Negative for hemoptysis, shortness of breath and wheezing.   Cardiovascular: Negative for chest pain, palpitations, orthopnea and leg swelling.  Gastrointestinal: Positive for diarrhea. Negative for abdominal pain, blood in stool, constipation, heartburn, melena, nausea and vomiting.  Genitourinary: Negative for dysuria, frequency and urgency.  Musculoskeletal: Positive for back pain, joint pain and myalgias.  Skin: Negative.  Negative for itching and rash.  Neurological: Negative for dizziness, tingling, focal weakness and headaches.  Psychiatric/Behavioral: Negative for depression. The patient is not nervous/anxious and does not have insomnia.       PAST MEDICAL HISTORY :  Past Medical History:  Diagnosis Date  . Anxiety   . Arthritis   . Atrial fibrillation ([place])    a. Dx 2013, recurred 02/2014, CHA2DS2VASc = 3 -->placed on Eliquis;  b. 02/2014 Echo: EF 50-55%, mid and apical anterior septum and mid and apical inf septum are abnl, mild to mod Ao sclerosis w/o AS.  Marland Kitchen Cancer associated pain   . Chicken pox   . Chronic lymphocytic leukemia ([place])  a. Dx 02/2014.  Marland Kitchen CLL (chronic lymphocytic leukemia) ([place])   . Complication of anesthesia    History of  PTSD--do not touch patient when waking up from surgery.  Marland Kitchen COPD (chronic  obstructive pulmonary disease) ([place])   . Coronary artery disease    a. 04/2009 CABG x 3 (LIMA->LAD, VG->OM1, VG->PDA);  b. 09/2009 Cath: occluded VG x 2 w/ patent LIMA and L->R collats. EF 55%, mild antlat HK;  c. 10/2011 MV: EF 53%, no isch/infarct-->low risk.  Marland Kitchen Dysrhythmia    hx of a-fib  . GERD (gastroesophageal reflux disease)    occasional  . History of chemotherapy 2015-2016  . HOH (hard of hearing)    Bilateral Hearing Aids  . Hypertension   . Myocardial infarction ([place]) 2010  . OSA on CPAP    USE C-PAP  . Presence of permanent cardiac pacemaker 2017  . PTSD (post-traumatic stress disorder)   . PTSD (post-traumatic stress disorder)   . Pure hypercholesterolemia   . Rheumatic fever 1959  . 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 TIA (transient ischemic attack) 11/02/2015    PAST SURGICAL HISTORY :   Past Surgical History:  Procedure Laterality Date  . ABDOMINAL HERNIA REPAIR    . APPENDECTOMY  06/21/1985  . CARDIAC CATHETERIZATION  2010; 2011   ; Dr Fletcher Anon  . CORONARY ARTERY BYPASS GRAFT  04/2009   "CABG X3"  . EP IMPLANTABLE DEVICE N/A 03/03/2016   Procedure: Pacemaker Implant;  Surgeon: Deboraha Sprang, MD;  Location: [facility];  Service: Cardiovascular;  Laterality: N/A;  . FOREIGN BODY REMOVAL  1968   "shrapnel in my tailbone"  . INGUINAL HERNIA REPAIR Right   . INSERT / REPLACE / REMOVE PACEMAKER    . JOINT REPLACEMENT Right 2018  . LAPAROSCOPIC CHOLECYSTECTOMY    . TONSILLECTOMY AND ADENOIDECTOMY  1956  . TOTAL HIP ARTHROPLASTY Right 10/22/2016   Procedure: TOTAL HIP ARTHROPLASTY;  Surgeon: Dereck Leep, MD;  Location: [facility];  Service: Orthopedics;  Laterality: Right;  . TOTAL HIP ARTHROPLASTY Left 11/04/2017   Procedure: TOTAL HIP ARTHROPLASTY;  Surgeon: Dereck Leep, MD;  Location: [facility];  Service: Orthopedics;  Laterality: Left;    FAMILY HISTORY :   Family History  Problem Relation Age of Onset  . Heart disease Mother   .  Heart attack Mother   . Coronary artery disease Other        family history    SOCIAL HISTORY:   Social History   Tobacco Use  . Smoking status: Former Smoker    Packs/day: 1.00    Years: 40.00    Pack years: 40.00    Types: Cigarettes    Quit date: 07/21/2006    Years since quitting: 14.1  . Smokeless tobacco: Never Used  Vaping Use  . Vaping Use: Former  Substance Use Topics  . Alcohol use: Not Currently  . Drug use: No    ALLERGIES:  has No Known Allergies.  MEDICATIONS:  Current Outpatient Medications  Medication Sig Dispense Refill  . acetaminophen (TYLENOL) 500 MG tablet Take 1,000 mg by mouth every 8 (eight) hours as needed for mild pain.    Marland Kitchen acyclovir (ZOVIRAX) 400 MG tablet Take 1 tablet (400 mg total) by mouth 2 (two) times daily. 60 tablet 6  . albuterol (PROVENTIL HFA;VENTOLIN HFA) 108 (90 Base) MCG/ACT inhaler Inhale 2 puffs into the lungs every 6 (six) hours as needed for wheezing or shortness of breath.  1 Inhaler 11  . amiodarone (PACERONE) 200 MG tablet Take 1/2 (one-half) tablet by mouth twice daily 90 tablet 1  . apixaban (ELIQUIS) 5 MG TABS tablet Take 5 mg by mouth 2 (two) times daily.    Marland Kitchen atorvastatin (LIPITOR) 40 MG tablet Take 40 mg by mouth daily. Start back slowly with 20 mg working up on slow titration to $RemoveBefo'40mg'MiUfqYOMWCs$  per Dr Rockey Situ.    Marland Kitchen azelastine (ASTELIN) 0.1 % nasal spray Place 2 sprays into both nostrils 2 (two) times daily. Use in each nostril as directed 30 mL 3  . budesonide-formoterol (SYMBICORT) 160-4.5 MCG/ACT inhaler Inhale 2 puffs into the lungs 2 (two) times daily. 1 Inhaler 11  . cetirizine (ZYRTEC) 10 MG tablet Take 10 mg by mouth daily as needed for allergies.     . Coenzyme Q10 (COQ10) 200 MG CAPS Take 200 mg by mouth daily.    Marland Kitchen ezetimibe (ZETIA) 10 MG tablet Take 1 tablet (10 mg total) by mouth daily. 90 tablet 3  . furosemide (LASIX) 20 MG tablet Take 1 tablet (20 mg) by mouth twice daily as needed     . Krill Oil 350 MG CAPS Take 350 mg  by mouth as needed.     . metoprolol tartrate (LOPRESSOR) 50 MG tablet Take 1 tablet (50 mg total) by mouth 2 (two) times daily. 180 tablet 1  . mirtazapine (REMERON) 15 MG tablet Take 15 mg by mouth at bedtime as needed (for panic associated with PTSD).     . Multiple Vitamin (MULTIVITAMIN WITH MINERALS) TABS tablet Take 1 tablet by mouth daily.    . nitroGLYCERIN (NITROSTAT) 0.4 MG SL tablet Place 1 tablet (0.4 mg total) under the tongue every 5 (five) minutes as needed for chest pain. 25 tablet 6  . traMADol (ULTRAM) 50 MG tablet Take 1 tablet (50 mg total) by mouth every 12 (twelve) hours as needed. 60 tablet 0  . VENCLEXTA 100 MG tablet TAKE 1 TABLET BY MOUTH ONCE DAILY 30 tablet 5   No current facility-administered medications for this visit.    PHYSICAL EXAMINATION: ECOG PERFORMANCE STATUS: 1 - Symptomatic but completely ambulatory  BP (!) 153/77 (BP Location: Left Arm, Patient Position: Sitting, Cuff Size: Normal)   Pulse 63   Temp (!) 97.5 F (36.4 C) (Oral)   Resp 20   Ht $R'5\' 9"'Ak$  (1.753 m)   Wt 222 lb 6.4 oz (100.9 kg)   SpO2 98%   BMI 32.84 kg/m   Filed Weights   09/27/20 0835  Weight: 222 lb 6.4 oz (100.9 kg)    Physical Exam Constitutional:      Comments: Patient is alone.  HENT:     Head: Normocephalic and atraumatic.     Mouth/Throat:     Pharynx: No oropharyngeal exudate.  Eyes:     Pupils: Pupils are equal, round, and reactive to light.  Cardiovascular:     Rate and Rhythm: Normal rate and regular rhythm.  Pulmonary:     Effort: No respiratory distress.     Breath sounds: No wheezing.  Abdominal:     General: Bowel sounds are normal. There is no distension.     Palpations: Abdomen is soft. There is no mass.     Tenderness: There is no abdominal tenderness. There is no guarding or rebound.  Musculoskeletal:        General: No tenderness. Normal range of motion.     Cervical back: Normal range of motion and neck supple.  Lymphadenopathy:  Comments:  Resolved lymph nodes in the neck underarms.  Skin:    General: Skin is warm.  Neurological:     Mental Status: He is alert and oriented to person, place, and time.  Psychiatric:        Mood and Affect: Affect normal.     LABORATORY DATA:  I have reviewed the data as listed    Component Value Date/Time   NA 137 08/16/2020 0803   NA 139 10/11/2014 1800   K 3.7 08/16/2020 0803   K 3.3 (L) 10/11/2014 1800   CL 104 08/16/2020 0803   CL 106 10/11/2014 1800   CO2 27 08/16/2020 0803   CO2 27 10/11/2014 1800   GLUCOSE 108 (H) 08/16/2020 0803   GLUCOSE 107 (H) 10/11/2014 1800   BUN 17 08/16/2020 0803   BUN 15 10/11/2014 1800   CREATININE 0.79 08/16/2020 0803   CREATININE 0.89 10/11/2014 1800   CALCIUM 8.6 (L) 08/16/2020 0803   CALCIUM 8.8 (L) 10/11/2014 1800   PROT 6.5 08/16/2020 0803   PROT 6.7 05/18/2017 1048   PROT 6.4 (L) 10/11/2014 1800   ALBUMIN 3.9 08/16/2020 0803   ALBUMIN 4.3 05/18/2017 1048   ALBUMIN 4.1 10/11/2014 1800   AST 16 08/16/2020 0803   AST 23 10/11/2014 1800   ALT 18 08/16/2020 0803   ALT 22 10/11/2014 1800   ALKPHOS 89 08/16/2020 0803   ALKPHOS 61 10/11/2014 1800   BILITOT 1.4 (H) 08/16/2020 0803   BILITOT 0.7 05/18/2017 1048   BILITOT 0.9 10/11/2014 1800   GFRNONAA >60 08/16/2020 0803   GFRNONAA >60 10/11/2014 1800   GFRAA >60 04/12/2020 0959   GFRAA >60 10/11/2014 1800    No results found for: SPEP, UPEP  Lab Results  Component Value Date   WBC 6.2 09/27/2020   NEUTROABS 3.8 09/27/2020   HGB 12.3 (L) 09/27/2020   HCT 36.6 (L) 09/27/2020   MCV 100.5 (H) 09/27/2020   PLT 133 (L) 09/27/2020      Chemistry      Component Value Date/Time   NA 137 08/16/2020 0803   NA 139 10/11/2014 1800   K 3.7 08/16/2020 0803   K 3.3 (L) 10/11/2014 1800   CL 104 08/16/2020 0803   CL 106 10/11/2014 1800   CO2 27 08/16/2020 0803   CO2 27 10/11/2014 1800   BUN 17 08/16/2020 0803   BUN 15 10/11/2014 1800   CREATININE 0.79 08/16/2020 0803   CREATININE  0.89 10/11/2014 1800      Component Value Date/Time   CALCIUM 8.6 (L) 08/16/2020 0803   CALCIUM 8.8 (L) 10/11/2014 1800   ALKPHOS 89 08/16/2020 0803   ALKPHOS 61 10/11/2014 1800   AST 16 08/16/2020 0803   AST 23 10/11/2014 1800   ALT 18 08/16/2020 0803   ALT 22 10/11/2014 1800   BILITOT 1.4 (H) 08/16/2020 0803   BILITOT 0.7 05/18/2017 1048   BILITOT 0.9 10/11/2014 1800       RADIOGRAPHIC STUDIES: I have personally reviewed the radiological images as listed and agreed with the findings in the report. No results found.   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  STABLE  AUG 4th 2021- CT-CR; STABLE.  # currently on Venoclax [100 mg sec to diarrhea; DI with Amio]. Labs today reviewed;  acceptable for treatment today. Will order scan today.   #Diarrhea-G-1 ? venatolclax- monitor for now.   #  Low immunoglobulins IgG 330/recent severe respiratory infection/CLL-plan IVIG #3 today-400 mg/kg x 1 today. Headaches improved with dexamethasone 10 mg IV pre-meds.   # A.fib-on amiodarone [drug interaction-amiodarone] sinus rhythm-on Eliquis-STABLE.   # Muscle pain-sec to Ventoclax-G-1-2; continue tramadol/CBD prn- STABLE.   *gd-gaitlin DISPOSITION: # IVIG infusion # follow up week of April 28th  MD; labs- cbc/cmp/ldh; IVIG infusion; CT C/A/P--Dr.B     Orders Placed This Encounter  Procedures  . CT CHEST ABDOMEN PELVIS W CONTRAST    Standing Status:   Future    Standing Expiration Date:   09/27/2021    Order Specific Question:   Preferred imaging location?    Answer:   [facility]    Order Specific Question:   Radiology Contrast Protocol - do NOT remove file path    Answer:   [email or web address]  . CBC with Differential/Platelet    Standing Status:   Future    Standing Expiration Date:   09/27/2021   . Comprehensive metabolic panel    Standing Status:   Future    Standing Expiration Date:   09/27/2021  . Lactate dehydrogenase    Standing Status:   Future    Standing Expiration Date:   09/27/2021   All questions were answered. The patient knows to call the clinic with any problems, questions or concerns.      Cammie Sickle, MD 09/27/2020 9:26 AM

## 2020-10-01 ENCOUNTER — Telehealth: Payer: Self-pay | Admitting: Primary Care

## 2020-10-01 NOTE — Telephone Encounter (Signed)
Patient is aware of date/time of covid test prior to PFT.  

## 2020-10-02 ENCOUNTER — Other Ambulatory Visit: Payer: Self-pay

## 2020-10-02 ENCOUNTER — Other Ambulatory Visit
Admission: RE | Admit: 2020-10-02 | Discharge: 2020-10-02 | Disposition: A | Discharge status: Home / Self Care | Payer: Medicare HMO | Source: Ambulatory Visit | Attending: Internal Medicine | Admitting: Internal Medicine

## 2020-10-02 DIAGNOSIS — Z20822 Contact with and (suspected) exposure to covid-19: Secondary | ICD-10-CM | POA: Diagnosis not present

## 2020-10-02 DIAGNOSIS — Z01812 Encounter for preprocedural laboratory examination: Secondary | ICD-10-CM | POA: Insufficient documentation

## 2020-10-02 LAB — SARS CORONAVIRUS 2 (TAT 6-24 HRS): SARS Coronavirus 2: NEGATIVE

## 2020-10-03 ENCOUNTER — Ambulatory Visit: Payer: Medicare HMO | Attending: Internal Medicine

## 2020-10-03 DIAGNOSIS — Z87891 Personal history of nicotine dependence: Secondary | ICD-10-CM | POA: Insufficient documentation

## 2020-10-03 DIAGNOSIS — J449 Chronic obstructive pulmonary disease, unspecified: Secondary | ICD-10-CM | POA: Insufficient documentation

## 2020-10-03 MED ORDER — ALBUTEROL SULFATE (2.5 MG/3ML) 0.083% IN NEBU
2.5000 mg | INHALATION_SOLUTION | Freq: Once | RESPIRATORY_TRACT | Status: AC
  Administered 2020-10-03: 2.5 mg via RESPIRATORY_TRACT
  Filled 2020-10-03: qty 3

## 2020-10-08 LAB — PULMONARY FUNCTION TEST ARMC ONLY
DL/VA % pred: 54 %
DL/VA: 2.16 ml/min/mmHg/L
DLCO unc % pred: 48 %
DLCO unc: 11.76 ml/min/mmHg
FEF 25-75 Post: 1.16 L/sec
FEF 25-75 Pre: 0.81 L/sec
FEF2575-%Change-Post: 42 %
FEF2575-%Pred-Post: 54 %
FEF2575-%Pred-Pre: 38 %
FEV1-%Change-Post: 13 %
FEV1-%Pred-Post: 64 %
FEV1-%Pred-Pre: 56 %
FEV1-Post: 1.9 L
FEV1-Pre: 1.67 L
FEV1FVC-%Change-Post: 10 %
FEV1FVC-%Pred-Pre: 82 %
FEV6-%Change-Post: 4 %
FEV6-%Pred-Post: 73 %
FEV6-%Pred-Pre: 70 %
FEV6-Post: 2.81 L
FEV6-Pre: 2.67 L
FEV6FVC-%Change-Post: 2 %
FEV6FVC-%Pred-Post: 104 %
FEV6FVC-%Pred-Pre: 102 %
FVC-%Change-Post: 2 %
FVC-%Pred-Post: 70 %
FVC-%Pred-Pre: 68 %
FVC-Post: 2.86 L
Post FEV1/FVC ratio: 66 %
Post FEV6/FVC ratio: 98 %
Pre FEV1/FVC ratio: 60 %
Pre FEV6/FVC Ratio: 96 %
RV % pred: 109 %
RV: 2.74 L
TLC % pred: 84 %
TLC: 5.81 L

## 2020-10-10 MED FILL — VENCLEXTA 100 MG TABS: 100 | 30 days supply | Qty: 30 | Fill #4

## 2020-10-12 ENCOUNTER — Ambulatory Visit
Admission: RE | Admit: 2020-10-12 | Discharge: 2020-10-12 | Disposition: A | Discharge status: Home / Self Care | Payer: Medicare HMO | Source: Ambulatory Visit | Attending: Internal Medicine | Admitting: Internal Medicine

## 2020-10-12 ENCOUNTER — Other Ambulatory Visit: Payer: Self-pay

## 2020-10-12 DIAGNOSIS — C9110 Chronic lymphocytic leukemia of B-cell type not having achieved remission: Secondary | ICD-10-CM

## 2020-10-12 IMAGING — CT CT CHEST-ABD-PELV W/ CM
3 of 5 series · 14 of 36 positions shown, 16 images · IV contrast (omnipaque)
Comparison: [DATE] chest CTA. Prior diagnostic chest, abdomen,
pelvic CTs [DATE].

CLINICAL DATA: Restaging of chronic lymphocytic leukemia diagnosed
in [OZ]. Chronic shortness of breath due to COPD. On chemotherapy.
Appendectomy. Cholecystectomy. Inguinal hernia repair. Ex-smoker,
quitting 14 years ago.

EXAM:
CT CHEST, ABDOMEN, AND PELVIS WITH CONTRAST
TECHNIQUE: Multidetector CT imaging of the chest, abdomen and pelvis was
performed following the standard protocol during bolus
administration of intravenous contrast.
CONTRAST:  100mL OMNIPAQUE IOHEXOL 300 MG/ML  SOLN

[Series 2: axials cap (person_name) 5.00 · axial · 0.81mm/px · z∈[-1497,-942]mm · 9 of 139 slices shown, 11 images]
[im 14/139  mediastinal]
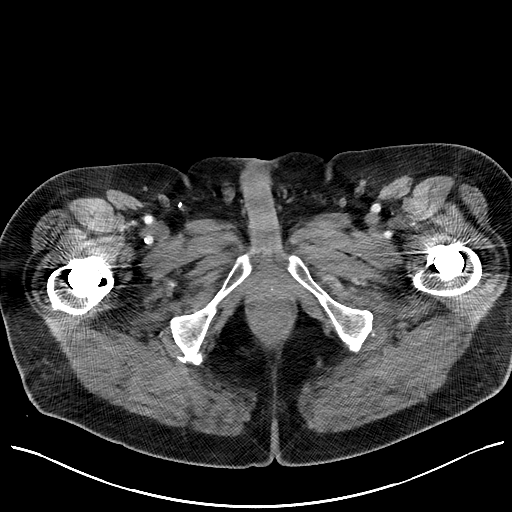
[im 14/139  bone]
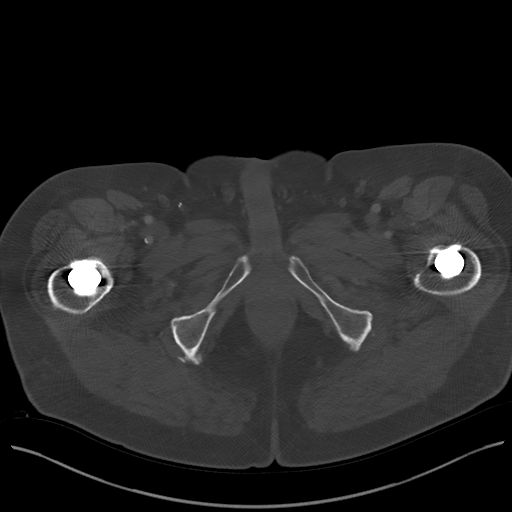
[im 28/139  mediastinal]
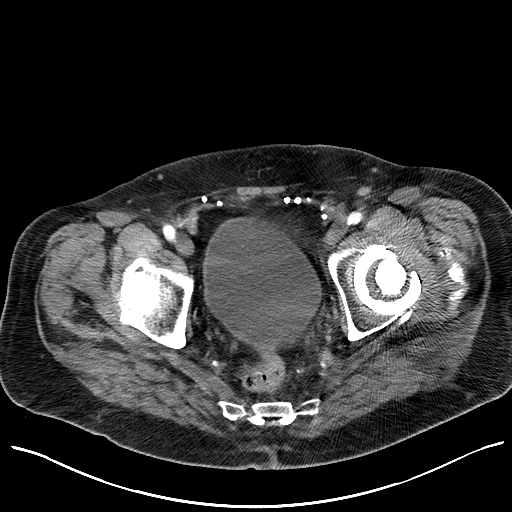
[im 42/139  mediastinal]
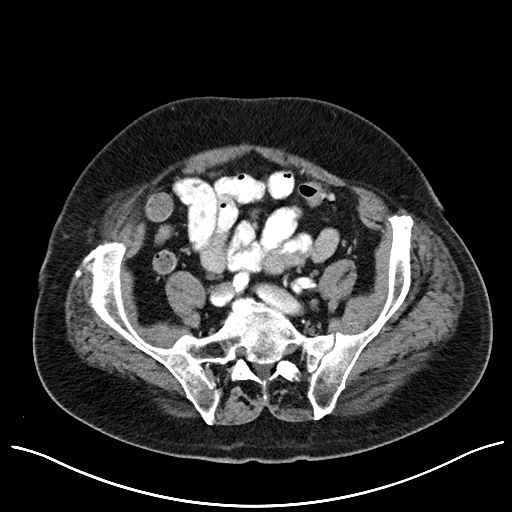
[im 56/139  mediastinal]
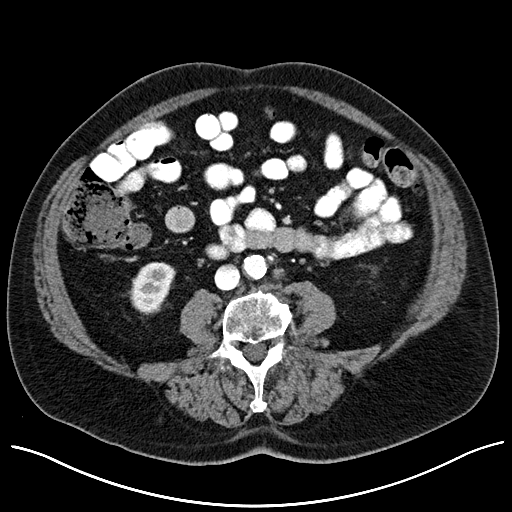
[im 70/139  mediastinal]
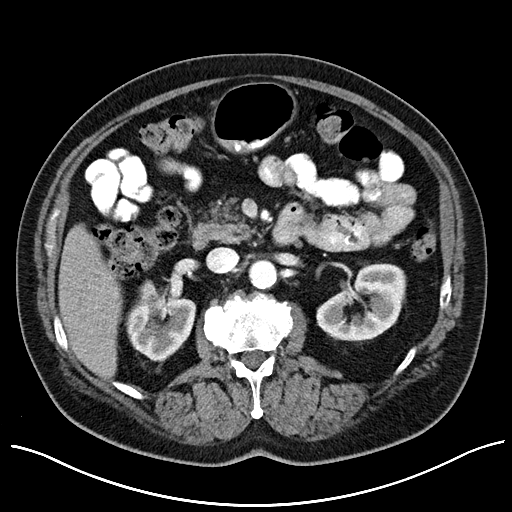
[im 83/139  mediastinal]
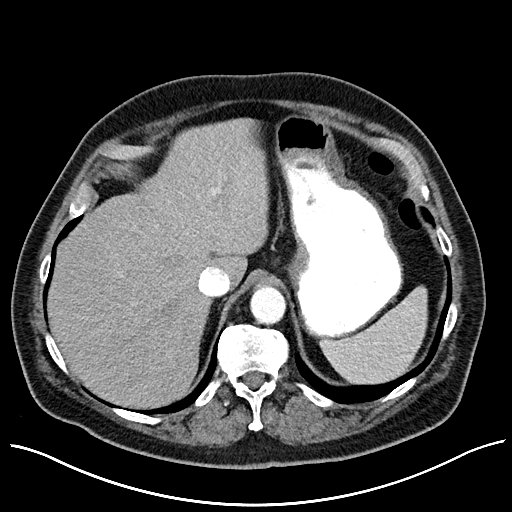
[im 97/139  mediastinal]
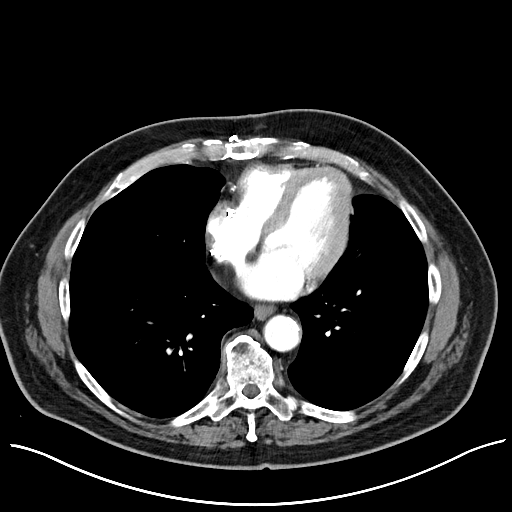
[im 111/139  mediastinal]
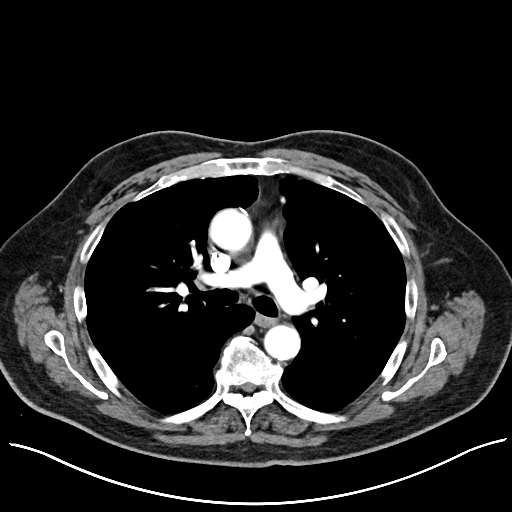
[im 125/139  mediastinal]
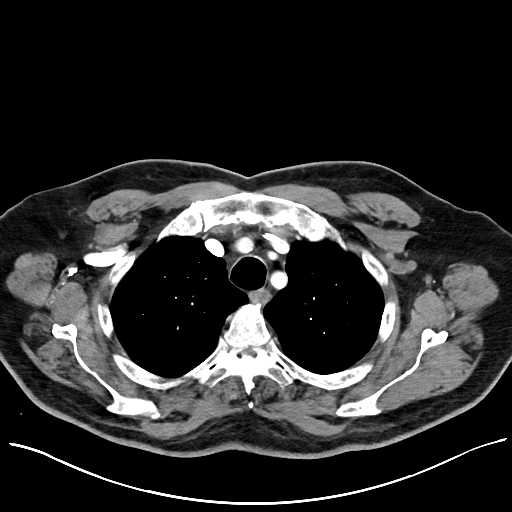
[im 125/139  bone]
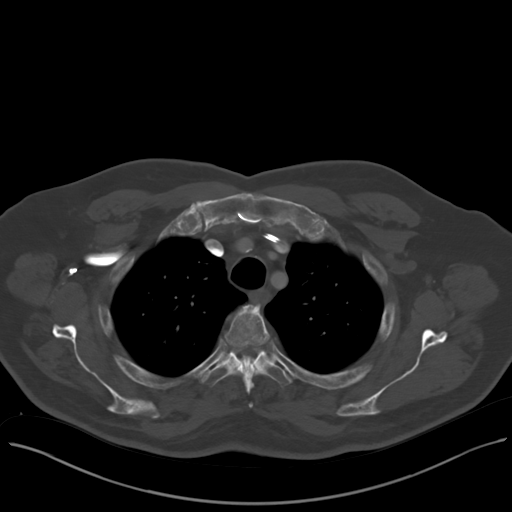

[Series 3: lungs cap 2.00 · axial · 0.73mm/px · z∈[-1164,-1110]mm · 2 of 161 slices shown]
[im 14/161  mediastinal]
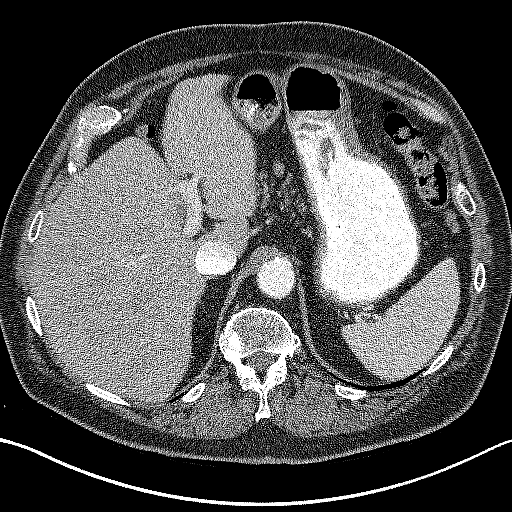
[im 41/161  mediastinal]
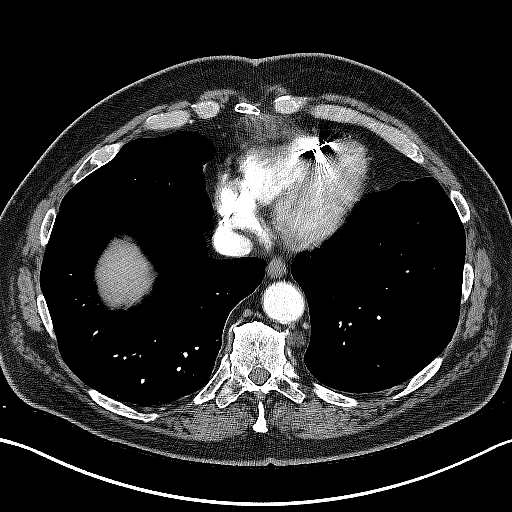

[Series 4: coronals cap (person_name) 2.00 cor · coronal · 0.81mm/px · 3 of 166 slices shown]
[im 34/166  mediastinal]
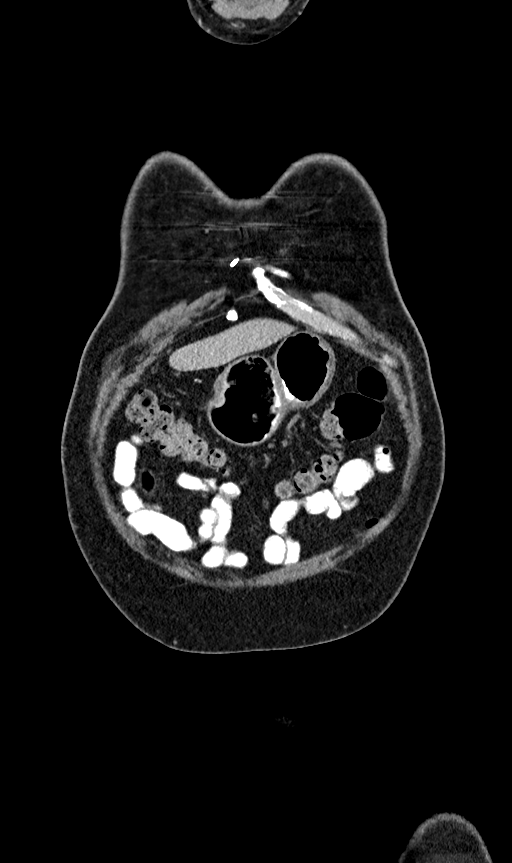
[im 67/166  mediastinal]
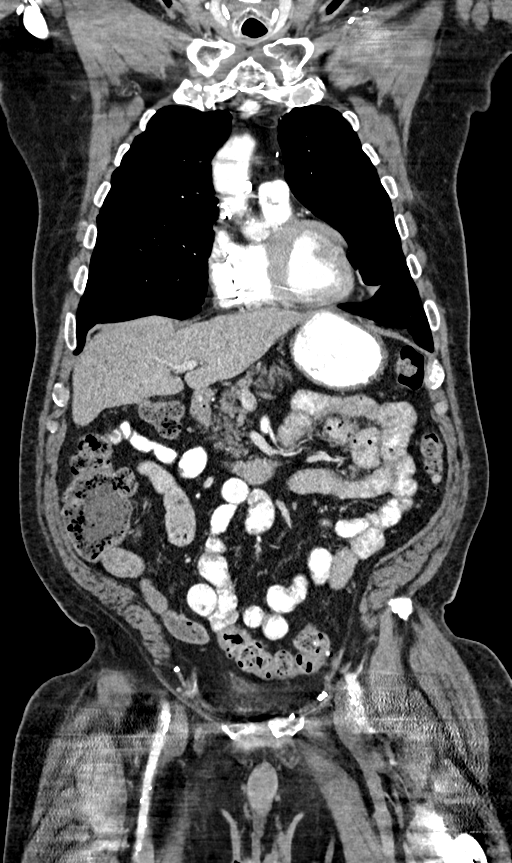
[im 100/166  mediastinal]
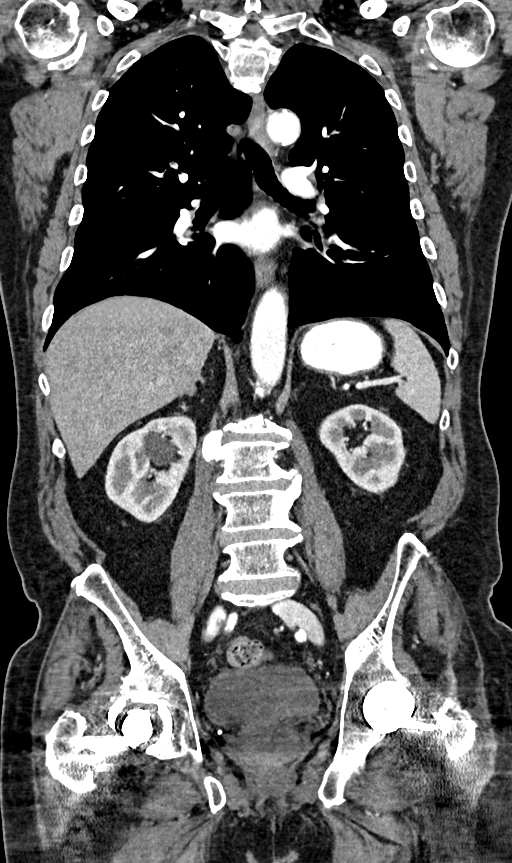

[14 of 36 positions shown; findings below may reference images not displayed]

FINDINGS: CT CHEST FINDINGS

Cardiovascular: Dual lead pacer. Aortic and branch vessel
atherosclerosis. Normal heart size, without pericardial effusion.
Median sternotomy for CABG. Lipomatous hypertrophy of the
interatrial septum. No central pulmonary embolism, on this
non-dedicated study.

Mediastinum/Nodes: No supraclavicular adenopathy. No axillary
adenopathy. No mediastinal or hilar adenopathy.

Lungs/Pleura: No pleural fluid. Mild centrilobular emphysema.
Right-sided calcified granulomas x2. No suspicious pulmonary nodule
or mass.

Musculoskeletal: Bilateral mild gynecomastia. No acute osseous
abnormality.

CT ABDOMEN PELVIS FINDINGS

Hepatobiliary: Normal liver. Cholecystectomy, without biliary ductal
dilatation.

Pancreas: Fatty replacement involving the pancreatic head and body.
No duct dilatation or acute inflammation.

Spleen: Normal in size, without focal abnormality.

Adrenals/Urinary Tract: Minimal right adrenal nodularity is
unchanged. Normal left adrenal gland. Too small to characterize
interpolar left renal lesion. Right renal sinus cyst of 3.7 cm,
without hydronephrosis. Degraded evaluation of the pelvis, secondary
to beam hardening artifact from bilateral hip arthroplasty. Grossly
normal urinary bladder.

Stomach/Bowel: Normal stomach, without wall thickening. Scattered
colonic diverticula. Colonic stool burden suggests constipation.
Normal terminal ileum. Normal small bowel.

Vascular/Lymphatic: Aortic atherosclerosis. Multiple right renal
arteries. Focal outpouching of the anterior infrarenal aorta
including at 8 mm short axis on 75/2, mildly increased from 6 mm on
the prior. No surrounding hemorrhage.

Small abdominal retroperitoneal nodes without adenopathy. No pelvic
sidewall adenopathy.

Reproductive: Normal prostate.

Other: No significant free fluid.  Left inguinal hernia repair

Musculoskeletal: Bilateral hip arthroplasty. Degenerative partial
fusion of the right sacroiliac joint.
IMPRESSION: 1. No findings of active lymphoma/leukemia. No adenopathy or
splenomegaly.
2.  Emphysema ([OZ]-[OZ]).   Aortic Atherosclerosis ([OZ]-[OZ]).
3. Focal anterior abdominal aortic outpouching is slightly
increased. Saccular aneurysm versus penetrating ulcer. Recommend
attention on follow-up.
4. Degraded evaluation of the pelvis, secondary to beam hardening
artifact from bilateral hip arthroplasty.
5. Bilateral gynecomastia.

## 2020-10-12 MED ORDER — IOHEXOL 300 MG/ML  SOLN
100.0000 mL | Freq: Once | INTRAMUSCULAR | Status: AC | PRN
  Administered 2020-10-12: 100 mL via INTRAVENOUS

## 2020-10-18 ENCOUNTER — Inpatient Hospital Stay: Payer: Medicare HMO

## 2020-10-18 ENCOUNTER — Encounter: Payer: Self-pay | Admitting: Internal Medicine

## 2020-10-18 ENCOUNTER — Other Ambulatory Visit (HOSPITAL_COMMUNITY): Payer: Self-pay

## 2020-10-18 ENCOUNTER — Inpatient Hospital Stay (HOSPITAL_BASED_OUTPATIENT_CLINIC_OR_DEPARTMENT_OTHER): Payer: Medicare HMO | Admitting: Internal Medicine

## 2020-10-18 ENCOUNTER — Other Ambulatory Visit: Payer: Self-pay

## 2020-10-18 VITALS — BP 162/71 | HR 72 | Temp 95.2°F | Resp 20

## 2020-10-18 DIAGNOSIS — C9110 Chronic lymphocytic leukemia of B-cell type not having achieved remission: Secondary | ICD-10-CM

## 2020-10-18 DIAGNOSIS — E860 Dehydration: Secondary | ICD-10-CM

## 2020-10-18 LAB — CBC WITH DIFFERENTIAL/PLATELET
Abs Immature Granulocytes: 0.02 10*3/uL (ref 0.00–0.07)
Basophils Absolute: 0 10*3/uL (ref 0.0–0.1)
Basophils Relative: 0 %
Eosinophils Absolute: 0 10*3/uL (ref 0.0–0.5)
Eosinophils Relative: 0 %
HCT: 35.6 % — ABNORMAL LOW (ref 39.0–52.0)
Hemoglobin: 12.2 g/dL — ABNORMAL LOW (ref 13.0–17.0)
Immature Granulocytes: 0 %
Lymphocytes Relative: 32 %
Lymphs Abs: 1.6 10*3/uL (ref 0.7–4.0)
MCH: 34.4 pg — ABNORMAL HIGH (ref 26.0–34.0)
MCHC: 34.3 g/dL (ref 30.0–36.0)
MCV: 100.3 fL — ABNORMAL HIGH (ref 80.0–100.0)
Monocytes Absolute: 0.8 10*3/uL (ref 0.1–1.0)
Monocytes Relative: 15 %
Neutro Abs: 2.7 10*3/uL (ref 1.7–7.7)
Neutrophils Relative %: 53 %
Platelets: 121 10*3/uL — ABNORMAL LOW (ref 150–400)
RBC: 3.55 MIL/uL — ABNORMAL LOW (ref 4.22–5.81)
RDW: 13.8 % (ref 11.5–15.5)
WBC: 5.2 10*3/uL (ref 4.0–10.5)
nRBC: 0 % (ref 0.0–0.2)

## 2020-10-18 LAB — COMPREHENSIVE METABOLIC PANEL
ALT: 25 U/L (ref 0–44)
AST: 25 U/L (ref 15–41)
Albumin: 3.8 g/dL (ref 3.5–5.0)
Alkaline Phosphatase: 90 U/L (ref 38–126)
Anion gap: 9 (ref 5–15)
BUN: 16 mg/dL (ref 8–23)
CO2: 25 mmol/L (ref 22–32)
Calcium: 8.5 mg/dL — ABNORMAL LOW (ref 8.9–10.3)
Chloride: 106 mmol/L (ref 98–111)
Creatinine, Ser: 0.88 mg/dL (ref 0.61–1.24)
GFR, Estimated: >60 mL/min (ref >60)
Glucose, Bld: 125 mg/dL — ABNORMAL HIGH (ref 70–99)
Potassium: 3.6 mmol/L (ref 3.5–5.1)
Sodium: 140 mmol/L (ref 135–145)
Total Bilirubin: 1.1 mg/dL (ref 0.3–1.2)
Total Protein: 6.6 g/dL (ref 6.5–8.1)

## 2020-10-18 LAB — LACTATE DEHYDROGENASE: LDH: 134 U/L (ref 98–192)

## 2020-10-18 MED ORDER — DEXTROSE 5 % IV SOLN
Freq: Once | INTRAVENOUS | Status: AC
  Filled 2020-10-18: qty 250

## 2020-10-18 MED ORDER — ACETAMINOPHEN 325 MG PO TABS
650.0000 mg | ORAL_TABLET | Freq: Once | ORAL | Status: AC
  Administered 2020-10-18: 650 mg via ORAL
  Filled 2020-10-18: qty 2

## 2020-10-18 MED ORDER — DIPHENHYDRAMINE HCL 25 MG PO CAPS
25.0000 mg | ORAL_CAPSULE | Freq: Once | ORAL | Status: AC
  Administered 2020-10-18: 25 mg via ORAL
  Filled 2020-10-18: qty 1

## 2020-10-18 MED ORDER — IMMUNE GLOBULIN (HUMAN) 10 GM/100ML IV SOLN
400.0000 mg/kg | Freq: Once | INTRAVENOUS | Status: AC
  Administered 2020-10-18: 40 g via INTRAVENOUS
  Filled 2020-10-18: qty 400

## 2020-10-18 MED ORDER — TRAMADOL HCL 50 MG PO TABS: 50.0000 mg | ORAL_TABLET | Freq: Two times a day (BID) | ORAL | 0 refills | Status: DC | PRN

## 2020-10-18 NOTE — Progress Notes (Signed)
[place] OFFICE PROGRESS NOTE  Patient Care Team: Leone Haven, MD as PCP - General (Family Medicine) Rockey Situ Kathlene November, MD as PCP - Cardiology (Cardiology) [name], MD as Consulting Physician (Cardiology) Cammie Sickle, MD as Medical Oncologist (Hematology and Oncology)  Cancer Staging No matching staging information was found for the patient.   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  MARCH 2017- CT N/C/A/P- NED. STOP Ritux; surveillance   # AUG 2019- CT/PET- progression/NO transformation;  # NOV 2019- Progression; started Ibrutinib 420 mg/d. STOPPED in end of feb sec to extreme fatigue/joint pains/cramps  #November 01, 2018-start ibrutinib 280 mg a day; December 20, 2018-discontinue ibrutinib secondary multiple side effects.   #January 03, 2019 start GCobden[July]; finished Dec 2020-; [facility] [200 mg a day; DI-amiodarone]  #Mid March 2021-venetoclax dose reduced to 100 mg [second diarrhea].   #? 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      INTERVAL HISTORY:  Michael [id number].  male CLL-high risk currently on venetoclax is here for follow-up/ s/p IVIG for low immunoglobulins.  Patient denies any worsening joint pains muscle pain.  He continues to have intermittent loose stools maybe once or twice a week.  Otherwise no nausea no vomiting.   \Review of Systems  Constitutional: Positive for malaise/fatigue. Negative for chills, diaphoresis and fever.  HENT: Negative for nosebleeds and sore throat.   Eyes: Negative for double vision.  Respiratory: Negative for hemoptysis, shortness of breath and wheezing.   Cardiovascular: Negative for chest pain, palpitations, orthopnea and leg swelling.  Gastrointestinal: Positive for diarrhea. Negative for abdominal pain, blood in stool, constipation, heartburn, melena, nausea and vomiting.  Genitourinary: Negative for dysuria, frequency and urgency.  Musculoskeletal: Positive for back pain, joint pain and myalgias.  Skin: Negative.  Negative for itching and rash.  Neurological: Negative for dizziness, tingling, focal weakness and headaches.  Psychiatric/Behavioral: Negative for depression. The patient is not nervous/anxious and does not have insomnia.       PAST MEDICAL HISTORY :  Past Medical History:  Diagnosis Date  . Anxiety   . Arthritis   . [place]rial fibrillation ([place]    a. Dx 2013, recurred 02/2014, CHA2DS2VASc = 3 -->placed on Eliquis;  b. 02/2014 Echo: EF 50-55%, mid and apical anterior septum and mid and apical inf septum are abnl, mild to mod Ao sclerosis w/o AS.  .Marland KitchenCancer associated pain   . Chicken pox   . Chronic lymphocytic leukemia ([facility]  a. Dx 02/2014.  Marland Kitchen [place]LL [place] lymph[place]) [place])   . Complication of anesthesia    History of  PTSD--do not touch patient when waking up from surgery.   Marland Kitchen COPD (chronic obstructive pulmonary disease) ([place])   . Coronary artery disease    a. 04/2009 CABG x 3 (LIMA->LAD, VG->OM1, VG->PDA);  b. 09/2009 Cath: occluded VG x 2 w/ patent LIMA and L->R collats. EF 55%, mild antlat HK;  c. 10/2011 MV: EF 53%, no isch/infarct-->low risk.  Marland Kitchen Dysrhythmia    hx of a-fib  . GERD (gastroesophageal reflux disease)    occasional  . History of chemotherapy 2015-2016  . HOH (hard of hearing)    Bilateral Hearing Aids  . Hypertension   . Myocardial infarction ([place]) 2010  . OSA on CPAP    USE C-PAP  . Presence of permanent cardiac pacemaker 2017  . PTSD (post-traumatic stress disorder)   . PTSD (post-traumatic stress disorder)   . Pure hypercholesterolemia   . Rheumatic fever 1959  . 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 TIA (transient ischemic attack) 11/02/2015    PAST SURGICAL HISTORY :   Past Surgical History:  Procedure Laterality Date  . ABDOMINAL HERNIA REPAIR    . APPENDECTOMY  06/21/1985  . CARDIAC CATHETERIZATION  2010; 2011   ; Dr Fletcher Anon  . CORONARY ARTERY BYPASS GRAFT  04/2009   "CABG X3"  . EP IMPLANTABLE DEVICE N/A 03/03/2016   Procedure: Pacemaker Implant;  Surgeon: Deboraha Sprang, MD;  Location: [facility];  Service: Cardiovascular;  Laterality: N/A;  . FOREIGN BODY REMOVAL  1968   "shrapnel in my tailbone"  . INGUINAL HERNIA REPAIR Right   . INSERT / REPLACE / REMOVE PACEMAKER    . JOINT REPLACEMENT Right 2018  . LAPAROSCOPIC CHOLECYSTECTOMY    . TONSILLECTOMY AND ADENOIDECTOMY  1956  . TOTAL HIP ARTHROPLASTY Right 10/22/2016   Procedure: TOTAL HIP ARTHROPLASTY;  Surgeon: Dereck Leep, MD;  Location: [facility];  Service: Orthopedics;  Laterality: Right;  . TOTAL HIP ARTHROPLASTY Left 11/04/2017   Procedure: TOTAL HIP ARTHROPLASTY;  Surgeon: Dereck Leep, MD;  Location: [facility];  Service: Orthopedics;  Laterality: Left;    FAMILY HISTORY :   Family History  Problem Relation Age of Onset  . Heart  disease Mother   . Heart attack Mother   . Coronary artery disease Other        family history    SOCIAL HISTORY:   Social History   Tobacco Use  . Smoking status: Former Smoker    Packs/day: 1.00    Years: 40.00    Pack years: 40.00    Types: Cigarettes    Quit date: 07/21/2006    Years since quitting: 14.2  . Smokeless tobacco: Never Used  Vaping Use  . Vaping Use: Former  Substance Use Topics  . Alcohol use: Not Currently  . Drug use: No    ALLERGIES:  has No Known Allergies.  MEDICATIONS:  Current Outpatient Medications  Medication Sig Dispense Refill  . acetaminophen (TYLENOL) 500 MG tablet Take 1,000 mg by mouth every 8 (eight) hours as needed for mild pain.    Marland Kitchen acyclovir (ZOVIRAX) 400 MG tablet Take 1 tablet (400 mg total) by mouth 2 (two) times daily. 60 tablet 6  . albuterol (PROVENTIL HFA;VENTOLIN HFA) 108 (90 Base) MCG/ACT inhaler Inhale 2 puffs into the lungs every 6 (six) hours as needed for wheezing or shortness of breath.  1 Inhaler 11  . amiodarone (PACERONE) 200 MG tablet Take 1/2 (one-half) tablet by mouth twice daily 90 tablet 1  . apixaban (ELIQUIS) 5 MG TABS tablet Take 5 mg by mouth 2 (two) times daily.    Marland Kitchen atorvastatin (LIPITOR) 40 MG tablet Take 40 mg by mouth daily. Start back slowly with 20 mg working up on slow titration to $RemoveBefo'40mg'ovpTsxMIZiV$  per Dr Rockey Situ.    Marland Kitchen azelastine (ASTELIN) 0.1 % nasal spray Place 2 sprays into both nostrils 2 (two) times daily. Use in each nostril as directed 30 mL 3  . budesonide-formoterol (SYMBICORT) 160-4.5 MCG/ACT inhaler Inhale 2 puffs into the lungs 2 (two) times daily. 1 Inhaler 11  . cetirizine (ZYRTEC) 10 MG tablet Take 10 mg by mouth daily as needed for allergies.     . Coenzyme Q10 (COQ10) 200 MG CAPS Take 200 mg by mouth daily.    Marland Kitchen ezetimibe (ZETIA) 10 MG tablet Take 1 tablet (10 mg total) by mouth daily. 90 tablet 3  . furosemide (LASIX) 20 MG tablet Take 1 tablet (20 mg) by mouth twice daily as needed     . Krill Oil 350  MG CAPS Take 350 mg by mouth as needed.     . metoprolol tartrate (LOPRESSOR) 50 MG tablet Take 1 tablet (50 mg total) by mouth 2 (two) times daily. 180 tablet 1  . mirtazapine (REMERON) 15 MG tablet Take 15 mg by mouth at bedtime as needed (for panic associated with PTSD).     . Multiple Vitamin (MULTIVITAMIN WITH MINERALS) TABS tablet Take 1 tablet by mouth daily.    . nitroGLYCERIN (NITROSTAT) 0.4 MG SL tablet Place 1 tablet (0.4 mg total) under the tongue every 5 (five) minutes as needed for chest pain. 25 tablet 6  . VENCLEXTA 100 MG tablet TAKE 1 TABLET BY MOUTH ONCE DAILY 30 tablet 5  . traMADol (ULTRAM) 50 MG tablet Take 1 tablet (50 mg total) by mouth every 12 (twelve) hours as needed. 60 tablet 0   No current facility-administered medications for this visit.    PHYSICAL EXAMINATION: ECOG PERFORMANCE STATUS: 1 - Symptomatic but completely ambulatory  BP 130/73 (BP Location: Left Arm, Patient Position: Sitting, Cuff Size: Normal)   Pulse 61   Temp 97.6 F (36.4 C) (Tympanic)   Resp 18   Ht $R'5\' 9"'RQ$  (1.753 m)   Wt 223 lb (101.2 kg)   SpO2 99%   BMI 32.93 kg/m   Filed Weights   10/18/20 0830  Weight: 223 lb (101.2 kg)    Physical Exam Constitutional:      Comments: Patient is alone.  HENT:     Head: Normocephalic and atraumatic.     Mouth/Throat:     Pharynx: No oropharyngeal exudate.  Eyes:     Pupils: Pupils are equal, round, and reactive to light.  Cardiovascular:     Rate and Rhythm: Normal rate and regular rhythm.  Pulmonary:     Effort: No respiratory distress.     Breath sounds: No wheezing.  Abdominal:     General: Bowel sounds are normal. There is no distension.     Palpations: Abdomen is soft. There is no mass.     Tenderness: There is no abdominal tenderness. There is no guarding or rebound.  Musculoskeletal:        General: No tenderness. Normal range of motion.     Cervical back: Normal range of motion and neck supple.  Lymphadenopathy:      Comments: Resolved  lymph nodes in the neck underarms.  Skin:    General: Skin is warm.  Neurological:     Mental Status: He is alert and oriented to person, place, and time.  Psychiatric:        Mood and Affect: Affect normal.     LABORATORY DATA:  I have reviewed the data as listed    Component Value Date/Time   NA 140 10/18/2020 0805   NA 139 10/11/2014 1800   K 3.6 10/18/2020 0805   K 3.3 (L) 10/11/2014 1800   CL 106 10/18/2020 0805   CL 106 10/11/2014 1800   CO2 25 10/18/2020 0805   CO2 27 10/11/2014 1800   GLUCOSE 125 (H) 10/18/2020 0805   GLUCOSE 107 (H) 10/11/2014 1800   BUN 16 10/18/2020 0805   BUN 15 10/11/2014 1800   CREATININE 0.88 10/18/2020 0805   CREATININE 0.89 10/11/2014 1800   CALCIUM 8.5 (L) 10/18/2020 0805   CALCIUM 8.8 (L) 10/11/2014 1800   PROT 6.6 10/18/2020 0805   PROT 6.7 05/18/2017 1048   PROT 6.4 (L) 10/11/2014 1800   ALBUMIN 3.8 10/18/2020 0805   ALBUMIN 4.3 05/18/2017 1048   ALBUMIN 4.1 10/11/2014 1800   AST 25 10/18/2020 0805   AST 23 10/11/2014 1800   ALT 25 10/18/2020 0805   ALT 22 10/11/2014 1800   ALKPHOS 90 10/18/2020 0805   ALKPHOS 61 10/11/2014 1800   BILITOT 1.1 10/18/2020 0805   BILITOT 0.7 05/18/2017 1048   BILITOT 0.9 10/11/2014 1800   GFRNONAA >60 10/18/2020 0805   GFRNONAA >60 10/11/2014 1800   GFRAA >60 04/12/2020 0959   GFRAA >60 10/11/2014 1800    No results found for: SPEP, UPEP  Lab Results  Component Value Date   WBC 5.2 10/18/2020   NEUTROABS 2.7 10/18/2020   HGB 12.2 (L) 10/18/2020   HCT 35.6 (L) 10/18/2020   MCV 100.3 (H) 10/18/2020   PLT 121 (L) 10/18/2020      Chemistry      Component Value Date/Time   NA 140 10/18/2020 0805   NA 139 10/11/2014 1800   K 3.6 10/18/2020 0805   K 3.3 (L) 10/11/2014 1800   CL 106 10/18/2020 0805   CL 106 10/11/2014 1800   CO2 25 10/18/2020 0805   CO2 27 10/11/2014 1800   BUN 16 10/18/2020 0805   BUN 15 10/11/2014 1800   CREATININE 0.88 10/18/2020 0805    CREATININE 0.89 10/11/2014 1800      Component Value Date/Time   CALCIUM 8.5 (L) 10/18/2020 0805   CALCIUM 8.8 (L) 10/11/2014 1800   ALKPHOS 90 10/18/2020 0805   ALKPHOS 61 10/11/2014 1800   AST 25 10/18/2020 0805   AST 23 10/11/2014 1800   ALT 25 10/18/2020 0805   ALT 22 10/11/2014 1800   BILITOT 1.1 10/18/2020 0805   BILITOT 0.7 05/18/2017 1048   BILITOT 0.9 10/11/2014 1800       RADIOGRAPHIC STUDIES: I have personally reviewed the radiological images as listed and agreed with the findings in the report. No results found.   ASSESSMENT & PLAN:  CLL (chronic lymphocytic leukemia) (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 MARCH 25th, 2022 CT C/A/P- NED.   # currently on Venoclax [100 mg sec to diarrhea; DI with Amio];  Labs today reviewed;  Acceptable.  #Diarrhea-G-1 ? venatolclax- monitor for now.   #Low immunoglobulins IgG 330/recent severe respiratory infection/CLL-plan IVIG #4/4  today-400 mg/kg x 1 today. Headaches improved with dexamethasone 10 mg IV pre-meds.   # A.fib-on amiodarone [drug interaction-amiodarone] sinus rhythm-on Eliquis-STABLE  # Muscle pain-sec to Ventoclax-G-1-2; continue tramadol/CBD prn- STABLE. Refilled Tramdaol.   DISPOSITION: # IVIG infusion # follow up in 1 month MD; labs- cbc/cmp/ldh;Dr.B  # I reviewed the blood work- with the patient in detail; also reviewed the imaging independently [as summarized above]; and with the patient in detail.       Orders Placed This Encounter  Procedures  . CBC with Differential/Platelet    Standing Status:   Future    Standing Expiration Date:   10/18/2021  . Comprehensive metabolic panel    Standing Status:   Future    Standing Expiration Date:   10/18/2021  . Lactate dehydrogenase    Standing Status:   Future    Standing Expiration Date:   10/18/2021   All questions were answered.  The patient knows to call the clinic with any problems, questions or concerns.      Cammie Sickle, MD 10/18/2020 9:16 AM

## 2020-10-18 NOTE — Assessment & Plan Note (Addendum)
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 MARCH 25th, 2022 CT C/A/P- NED.   # currently on Venoclax [100 mg sec to diarrhea; DI with Amio];  Labs today reviewed;  Acceptable.  #Diarrhea-G-1 ? venatolclax- monitor for now.   #Low immunoglobulins IgG 330/recent severe respiratory infection/CLL-plan IVIG #4/4 today-400 mg/kg x 1 today. Headaches improved with dexamethasone 10 mg IV pre-meds.   # A.fib-on amiodarone [drug interaction-amiodarone] sinus rhythm-on Eliquis-STABLE  # Muscle pain-sec to Ventoclax-G-1-2; continue tramadol/CBD prn- STABLE. Refilled Tramdaol.   DISPOSITION: # IVIG infusion # follow up in 1 month MD; labs- cbc/cmp/ldh;Dr.B  # I reviewed the blood work- with the patient in detail; also reviewed the imaging independently [as summarized above]; and with the patient in detail.

## 2020-10-23 ENCOUNTER — Telehealth: Payer: Self-pay | Admitting: *Deleted

## 2020-10-23 NOTE — Telephone Encounter (Signed)
-----   Message from [name] sent at 10/23/2020  2:35 PM EDT ----- Regarding: IVIG Contact: [phone] PATIENT CALLED AND SAID DR B TOLD HIM HE WASN'T GETTING ANYMORE IVIG INFUSIONS BUT IT'S ON HIS AVS THAT HE IS GETTING IVIG ON 4/28. HE WANTS TO KNOW WHETHER HE IS OR NOT GETTING IVIG AGAIN. PLEASE CALL PATIENT

## 2020-10-24 NOTE — Telephone Encounter (Signed)
I called the patient. Left vm. Explained that he would not be having treament on 4/28, but to keep his lab/md apt as scheduled

## 2020-10-24 NOTE — Telephone Encounter (Signed)
please inform pt that no infusion is recommended; cancel the infusion. for next month-Thx

## 2020-10-31 ENCOUNTER — Other Ambulatory Visit (HOSPITAL_COMMUNITY): Payer: Self-pay

## 2020-11-12 ENCOUNTER — Other Ambulatory Visit (HOSPITAL_COMMUNITY): Payer: Self-pay

## 2020-11-12 MED FILL — Venetoclax Tab 100 MG: ORAL | 30 days supply | Qty: 30 | Fill #0 | Status: AC

## 2020-11-15 ENCOUNTER — Inpatient Hospital Stay: Payer: Medicare HMO | Attending: Internal Medicine

## 2020-11-15 ENCOUNTER — Other Ambulatory Visit: Payer: Self-pay

## 2020-11-15 ENCOUNTER — Inpatient Hospital Stay (HOSPITAL_BASED_OUTPATIENT_CLINIC_OR_DEPARTMENT_OTHER): Payer: Medicare HMO | Admitting: Internal Medicine

## 2020-11-15 ENCOUNTER — Ambulatory Visit: Payer: Medicare HMO

## 2020-11-15 DIAGNOSIS — R197 Diarrhea, unspecified: Secondary | ICD-10-CM | POA: Insufficient documentation

## 2020-11-15 DIAGNOSIS — C9110 Chronic lymphocytic leukemia of B-cell type not having achieved remission: Secondary | ICD-10-CM

## 2020-11-15 DIAGNOSIS — Z87891 Personal history of nicotine dependence: Secondary | ICD-10-CM | POA: Diagnosis not present

## 2020-11-15 DIAGNOSIS — Z7901 Long term (current) use of anticoagulants: Secondary | ICD-10-CM | POA: Diagnosis not present

## 2020-11-15 DIAGNOSIS — M791 Myalgia, unspecified site: Secondary | ICD-10-CM | POA: Diagnosis not present

## 2020-11-15 DIAGNOSIS — I4891 Unspecified atrial fibrillation: Secondary | ICD-10-CM | POA: Diagnosis not present

## 2020-11-15 LAB — CBC WITH DIFFERENTIAL/PLATELET
Abs Immature Granulocytes: 0.01 10*3/uL (ref 0.00–0.07)
Basophils Absolute: 0 10*3/uL (ref 0.0–0.1)
Basophils Relative: 0 %
Eosinophils Absolute: 0 10*3/uL (ref 0.0–0.5)
Eosinophils Relative: 0 %
HCT: 36.4 % — ABNORMAL LOW (ref 39.0–52.0)
Hemoglobin: 12.1 g/dL — ABNORMAL LOW (ref 13.0–17.0)
Immature Granulocytes: 0 %
Lymphocytes Relative: 32 %
Lymphs Abs: 1.5 10*3/uL (ref 0.7–4.0)
MCH: 33.8 pg (ref 26.0–34.0)
MCHC: 33.2 g/dL (ref 30.0–36.0)
MCV: 101.7 fL — ABNORMAL HIGH (ref 80.0–100.0)
Monocytes Absolute: 0.7 10*3/uL (ref 0.1–1.0)
Monocytes Relative: 16 %
Neutro Abs: 2.4 10*3/uL (ref 1.7–7.7)
Neutrophils Relative %: 52 %
Platelets: 139 10*3/uL — ABNORMAL LOW (ref 150–400)
RBC: 3.58 MIL/uL — ABNORMAL LOW (ref 4.22–5.81)
RDW: 13.8 % (ref 11.5–15.5)
WBC: 4.6 10*3/uL (ref 4.0–10.5)
nRBC: 0 % (ref 0.0–0.2)

## 2020-11-15 LAB — COMPREHENSIVE METABOLIC PANEL
ALT: 27 U/L (ref 0–44)
AST: 25 U/L (ref 15–41)
Albumin: 4 g/dL (ref 3.5–5.0)
Alkaline Phosphatase: 91 U/L (ref 38–126)
Anion gap: 8 (ref 5–15)
BUN: 17 mg/dL (ref 8–23)
CO2: 28 mmol/L (ref 22–32)
Calcium: 8.8 mg/dL — ABNORMAL LOW (ref 8.9–10.3)
Chloride: 106 mmol/L (ref 98–111)
Creatinine, Ser: 0.98 mg/dL (ref 0.61–1.24)
GFR, Estimated: >60 mL/min (ref >60)
Glucose, Bld: 119 mg/dL — ABNORMAL HIGH (ref 70–99)
Potassium: 4 mmol/L (ref 3.5–5.1)
Sodium: 142 mmol/L (ref 135–145)
Total Bilirubin: 1.3 mg/dL — ABNORMAL HIGH (ref 0.3–1.2)
Total Protein: 6.6 g/dL (ref 6.5–8.1)

## 2020-11-15 LAB — LACTATE DEHYDROGENASE: LDH: 153 U/L (ref 98–192)

## 2020-11-15 NOTE — Assessment & Plan Note (Addendum)
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 MARCH 25th, 2022 CT C/A/P- NED.   # currently on Venoclax [100 mg sec to diarrhea; DI with Amio];  Labs today reviewed;; STABLE.   #Diarrhea-G-1 ? venatolclax- monitor for now.  Stable.  #Low immunoglobulins IgG 330/recent severe respiratory infection/CLL-plan IVIG #4/4 today-400 mg/kg x 1 today; will check Immunoglobulin at next visit.  # A.fib-on amiodarone [drug interaction-amiodarone] sinus rhythm-on Eliquis-STABLE.   # Muscle pain-sec to Ventoclax-G-1-2; continue tramadol/CBD prn-stable refilled Tramdaol.   DISPOSITION: # follow up in 6 weeks MD; labs- cbc/cmp/ldh;quantitative immunoglobin;Dr.B

## 2020-11-15 NOTE — Progress Notes (Signed)
[place] OFFICE PROGRESS NOTE  Patient Care Team: Leone Haven, MD as PCP - General (Family Medicine) Rockey Situ Kathlene November, MD as PCP - Cardiology (Cardiology) [name], MD as Consulting Physician (Cardiology) Cammie Sickle, MD as Medical Oncologist (Hematology and Oncology)  Cancer Staging No matching staging information was found for the patient.   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  MARCH 2017- CT N/C/A/P- NED. STOP Ritux; surveillance   # AUG 2019- CT/PET- progression/NO transformation;  # NOV 2019- Progression; started Ibrutinib 420 mg/d. STOPPED in end of feb sec to extreme fatigue/joint pains/cramps  #November 01, 2018-start ibrutinib 280 mg a day; December 20, 2018-discontinue ibrutinib secondary multiple side effects.   #January 03, 2019 start [place][July]; finished Dec 2020-; [facility] [200 mg a day; DI-amiodarone]  #Mid March 2021-venetoclax dose reduced to 100 mg [second diarrhea].   #? 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      INTERVAL HISTORY:  TMARQUAIL BRADWELL89y.o.  male CLL-high risk currently on venetoclax is here for follow-up/ s/p IVIG x4 for low immunoglobulins.   Patient denies any hospitalizations.  No recent infections.  Continues to have 1-2 loose stools every 3 to 4 days.  Continues to have chronic joint pains muscle pain not any worse.  Otherwise no nausea vomiting.   \Review of Systems  Constitutional: Positive for malaise/fatigue. Negative for chills, diaphoresis and fever.  HENT: Negative for nosebleeds and sore throat.   Eyes: Negative for double vision.  Respiratory: Negative for hemoptysis, shortness of breath and wheezing.   Cardiovascular: Negative for chest pain, palpitations, orthopnea and leg swelling.  Gastrointestinal: Positive for diarrhea. Negative for abdominal pain, blood in stool, constipation, heartburn, melena, nausea and vomiting.  Genitourinary: Negative for dysuria, frequency and urgency.  Musculoskeletal: Positive for back pain, joint pain and myalgias.  Skin: Negative.  Negative for itching and rash.  Neurological: Negative for dizziness, tingling, focal weakness and headaches.  Psychiatric/Behavioral: Negative for depression. The patient is not nervous/anxious and does not have insomnia.       PAST MEDICAL HISTORY :  Past Medical History:  Diagnosis Date  . Anxiety   . Arthritis   . Atrial fibrillation ([place]    a. Dx 2013, recurred 02/2014, CHA2DS2VASc = 3 -->placed on Eliquis;  b. 02/2014 Echo: EF 50-55%, mid and apical anterior septum and mid and apical inf septum are abnl, mild to mod Ao sclerosis w/o AS.  .Marland KitchenCancer associated pain   .  Chicken pox   . Chronic lymphocytic leukemia ([place])    a. Dx 02/2014.  Marland Kitchen CLL (chronic lymphocytic leukemia) ([place])   . Complication of anesthesia    History of   PTSD--do not touch patient when waking up from surgery.  Marland Kitchen COPD (chronic obstructive pulmonary disease) ([place])   . Coronary artery disease    a. 04/2009 CABG x 3 (LIMA->LAD, VG->OM1, VG->PDA);  b. 09/2009 Cath: occluded VG x 2 w/ patent LIMA and L->R collats. EF 55%, mild antlat HK;  c. 10/2011 MV: EF 53%, no isch/infarct-->low risk.  Marland Kitchen Dysrhythmia    hx of a-fib  . GERD (gastroesophageal reflux disease)    occasional  . History of chemotherapy 2015-2016  . HOH (hard of hearing)    Bilateral Hearing Aids  . Hypertension   . Myocardial infarction ([place]) 2010  . OSA on CPAP    USE C-PAP  . Presence of permanent cardiac pacemaker 2017  . PTSD (post-traumatic stress disorder)   . PTSD (post-traumatic stress disorder)   . Pure hypercholesterolemia   . Rheumatic fever 1959  . 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 TIA (transient ischemic attack) 11/02/2015    PAST SURGICAL HISTORY :   Past Surgical History:  Procedure Laterality Date  . ABDOMINAL HERNIA REPAIR    . APPENDECTOMY  06/21/1985  . CARDIAC CATHETERIZATION  2010; 2011   ; Dr Fletcher Anon  . CORONARY ARTERY BYPASS GRAFT  04/2009   "CABG X3"  . EP IMPLANTABLE DEVICE N/A 03/03/2016   Procedure: Pacemaker Implant;  Surgeon: Deboraha Sprang, MD;  Location: [place] [facility];  Service: Cardiovascular;  Laterality: N/A;  . FOREIGN BODY REMOVAL  1968   "shrapnel in my tailbone"  . INGUINAL HERNIA REPAIR Right   . INSERT / REPLACE / REMOVE PACEMAKER    . JOINT REPLACEMENT Right 2018  . LAPAROSCOPIC CHOLECYSTECTOMY    . TONSILLECTOMY AND ADENOIDECTOMY  1956  . TOTAL HIP ARTHROPLASTY Right 10/22/2016   Procedure: TOTAL HIP ARTHROPLASTY;  Surgeon: Dereck Leep, MD;  Location: [facility];  Service: Orthopedics;  Laterality: Right;  . TOTAL HIP ARTHROPLASTY Left 11/04/2017   Procedure: TOTAL HIP ARTHROPLASTY;  Surgeon: Dereck Leep, MD;  Location: [facility];  Service: Orthopedics;  Laterality: Left;    FAMILY HISTORY :    Family History  Problem Relation Age of Onset  . Heart disease Mother   . Heart attack Mother   . Coronary artery disease Other        family history    SOCIAL HISTORY:   Social History   Tobacco Use  . Smoking status: Former Smoker    Packs/day: 1.00    Years: 40.00    Pack years: 40.00    Types: Cigarettes    Quit date: 07/21/2006    Years since quitting: 14.3  . Smokeless tobacco: Never Used  Vaping Use  . Vaping Use: Former  Substance Use Topics  . Alcohol use: Not Currently  . Drug use: No    ALLERGIES:  has No Known Allergies.  MEDICATIONS:  Current Outpatient Medications  Medication Sig Dispense Refill  . acetaminophen (TYLENOL) 500 MG tablet Take 1,000 mg by mouth every 8 (eight) hours as needed for mild pain.    Marland Kitchen acyclovir (ZOVIRAX) 400 MG tablet Take 1 tablet (400 mg total) by mouth 2 (two) times daily. 60 tablet 6  . albuterol (PROVENTIL HFA;VENTOLIN HFA) 108 (90 Base) MCG/ACT inhaler Inhale 2 puffs into the lungs  every 6 (six) hours as needed for wheezing or shortness of breath. 1 Inhaler 11  . amiodarone (PACERONE) 200 MG tablet Take 1/2 (one-half) tablet by mouth twice daily 90 tablet 1  . apixaban (ELIQUIS) 5 MG TABS tablet Take 5 mg by mouth 2 (two) times daily.    Marland Kitchen atorvastatin (LIPITOR) 40 MG tablet Take 40 mg by mouth daily. Start back slowly with 20 mg working up on slow titration to $RemoveBefo'40mg'boOvzDRPtTC$  per Dr Rockey Situ.    Marland Kitchen azelastine (ASTELIN) 0.1 % nasal spray Place 2 sprays into both nostrils 2 (two) times daily. Use in each nostril as directed 30 mL 3  . budesonide-formoterol (SYMBICORT) 160-4.5 MCG/ACT inhaler Inhale 2 puffs into the lungs 2 (two) times daily. 1 Inhaler 11  . cetirizine (ZYRTEC) 10 MG tablet Take 10 mg by mouth daily as needed for allergies.     . Coenzyme Q10 (COQ10) 200 MG CAPS Take 200 mg by mouth daily.    Marland Kitchen ezetimibe (ZETIA) 10 MG tablet Take 1 tablet (10 mg total) by mouth daily. 90 tablet 3  . furosemide (LASIX) 20 MG tablet Take 1 tablet  (20 mg) by mouth twice daily as needed     . Krill Oil 350 MG CAPS Take 350 mg by mouth as needed.     . metoprolol tartrate (LOPRESSOR) 50 MG tablet Take 1 tablet (50 mg total) by mouth 2 (two) times daily. 180 tablet 1  . mirtazapine (REMERON) 15 MG tablet Take 15 mg by mouth at bedtime as needed (for panic associated with PTSD).     . Multiple Vitamin (MULTIVITAMIN WITH MINERALS) TABS tablet Take 1 tablet by mouth daily.    . nitroGLYCERIN (NITROSTAT) 0.4 MG SL tablet Place 1 tablet (0.4 mg total) under the tongue every 5 (five) minutes as needed for chest pain. 25 tablet 6  . traMADol (ULTRAM) 50 MG tablet Take 1 tablet (50 mg total) by mouth every 12 (twelve) hours as needed. 60 tablet 0  . venetoclax (VENCLEXTA) 100 MG tablet TAKE 1 TABLET BY MOUTH ONCE DAILY 30 tablet 5   No current facility-administered medications for this visit.    PHYSICAL EXAMINATION: ECOG PERFORMANCE STATUS: 1 - Symptomatic but completely ambulatory  BP (!) 168/94 (BP Location: Left Arm, Patient Position: Sitting)   Pulse 68   Temp 98 F (36.7 C) (Tympanic)   Resp 18   Wt 224 lb 12.8 oz (102 kg)   SpO2 100%   BMI 33.20 kg/m   Filed Weights   11/15/20 0822  Weight: 224 lb 12.8 oz (102 kg)    Physical Exam Constitutional:      Comments: Patient is alone.  HENT:     Head: Normocephalic and atraumatic.     Mouth/Throat:     Pharynx: No oropharyngeal exudate.  Eyes:     Pupils: Pupils are equal, round, and reactive to light.  Cardiovascular:     Rate and Rhythm: Normal rate and regular rhythm.  Pulmonary:     Effort: No respiratory distress.     Breath sounds: No wheezing.  Abdominal:     General: Bowel sounds are normal. There is no distension.     Palpations: Abdomen is soft. There is no mass.     Tenderness: There is no abdominal tenderness. There is no guarding or rebound.  Musculoskeletal:        General: No tenderness. Normal range of motion.     Cervical back: Normal range of motion  and neck supple.  Lymphadenopathy:     Comments: Resolved lymph nodes in the neck underarms.  Skin:    General: Skin is warm.  Neurological:     Mental Status: He is alert and oriented to person, place, and time.  Psychiatric:        Mood and Affect: Affect normal.     LABORATORY DATA:  I have reviewed the data as listed    Component Value Date/Time   NA 142 11/15/2020 0801   NA 139 10/11/2014 1800   K 4.0 11/15/2020 0801   K 3.3 (L) 10/11/2014 1800   CL 106 11/15/2020 0801   CL 106 10/11/2014 1800   CO2 28 11/15/2020 0801   CO2 27 10/11/2014 1800   GLUCOSE 119 (H) 11/15/2020 0801   GLUCOSE 107 (H) 10/11/2014 1800   BUN 17 11/15/2020 0801   BUN 15 10/11/2014 1800   CREATININE 0.98 11/15/2020 0801   CREATININE 0.89 10/11/2014 1800   CALCIUM 8.8 (L) 11/15/2020 0801   CALCIUM 8.8 (L) 10/11/2014 1800   PROT 6.6 11/15/2020 0801   PROT 6.7 05/18/2017 1048   PROT 6.4 (L) 10/11/2014 1800   ALBUMIN 4.0 11/15/2020 0801   ALBUMIN 4.3 05/18/2017 1048   ALBUMIN 4.1 10/11/2014 1800   AST 25 11/15/2020 0801   AST 23 10/11/2014 1800   ALT 27 11/15/2020 0801   ALT 22 10/11/2014 1800   ALKPHOS 91 11/15/2020 0801   ALKPHOS 61 10/11/2014 1800   BILITOT 1.3 (H) 11/15/2020 0801   BILITOT 0.7 05/18/2017 1048   BILITOT 0.9 10/11/2014 1800   GFRNONAA >60 11/15/2020 0801   GFRNONAA >60 10/11/2014 1800   GFRAA >60 04/12/2020 0959   GFRAA >60 10/11/2014 1800    No results found for: SPEP, UPEP  Lab Results  Component Value Date   WBC 4.6 11/15/2020   NEUTROABS 2.4 11/15/2020   HGB 12.1 (L) 11/15/2020   HCT 36.4 (L) 11/15/2020   MCV 101.7 (H) 11/15/2020   PLT 139 (L) 11/15/2020      Chemistry      Component Value Date/Time   NA 142 11/15/2020 0801   NA 139 10/11/2014 1800   K 4.0 11/15/2020 0801   K 3.3 (L) 10/11/2014 1800   CL 106 11/15/2020 0801   CL 106 10/11/2014 1800   CO2 28 11/15/2020 0801   CO2 27 10/11/2014 1800   BUN 17 11/15/2020 0801   BUN 15 10/11/2014  1800   CREATININE 0.98 11/15/2020 0801   CREATININE 0.89 10/11/2014 1800      Component Value Date/Time   CALCIUM 8.8 (L) 11/15/2020 0801   CALCIUM 8.8 (L) 10/11/2014 1800   ALKPHOS 91 11/15/2020 0801   ALKPHOS 61 10/11/2014 1800   AST 25 11/15/2020 0801   AST 23 10/11/2014 1800   ALT 27 11/15/2020 0801   ALT 22 10/11/2014 1800   BILITOT 1.3 (H) 11/15/2020 0801   BILITOT 0.7 05/18/2017 1048   BILITOT 0.9 10/11/2014 1800       RADIOGRAPHIC STUDIES: I have personally reviewed the radiological images as listed and agreed with the findings in the report. No results found.   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 MARCH 25th, 2022 CT C/A/P- NED.   # currently on Venoclax [100 mg sec to diarrhea; DI with Amio];  Labs today reviewed;; STABLE.   #Diarrhea-G-1 ? venatolclax- monitor for now.  Stable.  #  Low immunoglobulins IgG 330/recent severe respiratory infection/CLL-plan IVIG #4/4 today-400 mg/kg x 1 today; will check Immunoglobulin at next visit.  # A.fib-on amiodarone [drug interaction-amiodarone] sinus rhythm-on Eliquis-STABLE.   # Muscle pain-sec to Ventoclax-G-1-2; continue tramadol/CBD prn-stable refilled Tramdaol.   DISPOSITION: # follow up in 6 weeks MD; labs- cbc/cmp/ldh;quantitative immunoglobin;Dr.B      Orders Placed This Encounter  Procedures  . CBC with Differential    Standing Status:   Future    Standing Expiration Date:   11/15/2021  . Comprehensive metabolic panel    Standing Status:   Future    Standing Expiration Date:   11/15/2021  . Lactate dehydrogenase    Standing Status:   Future    Standing Expiration Date:   11/15/2021  . Miscellaneous [facility] test (send-out)    Standing Status:   Future    Standing Expiration Date:   11/15/2021    Order Specific Question:   Test name  / description:    Answer:   TEST: [id number] -  Immunoglobulin D, Quantitative    Order Specific Question:   Release to patient    Answer:   Immediate   All questions were answered. The patient knows to call the clinic with any problems, questions or concerns.      Cammie Sickle, MD 11/15/2020 1:51 PM

## 2020-12-05 ENCOUNTER — Other Ambulatory Visit (HOSPITAL_COMMUNITY): Payer: Self-pay

## 2020-12-05 ENCOUNTER — Other Ambulatory Visit: Payer: Self-pay | Admitting: Internal Medicine

## 2020-12-05 MED ORDER — VENETOCLAX 100 MG PO TABS
ORAL_TABLET | Freq: Every day | ORAL | 0 refills | Status: DC
  Filled 2020-12-05 – 2020-12-11 (×2): qty 30, 30d supply, fill #0

## 2020-12-10 ENCOUNTER — Ambulatory Visit: Payer: Medicare HMO | Admitting: Cardiovascular Disease

## 2020-12-11 ENCOUNTER — Encounter: Payer: Self-pay | Admitting: Internal Medicine

## 2020-12-11 ENCOUNTER — Other Ambulatory Visit: Payer: Self-pay

## 2020-12-11 ENCOUNTER — Ambulatory Visit: Payer: Medicare HMO | Admitting: Internal Medicine

## 2020-12-11 ENCOUNTER — Other Ambulatory Visit (HOSPITAL_COMMUNITY): Payer: Self-pay

## 2020-12-11 VITALS — BP 160/92 | HR 66 | Ht 69.0 in | Wt 222.0 lb

## 2020-12-11 DIAGNOSIS — R072 Precordial pain: Secondary | ICD-10-CM

## 2020-12-11 DIAGNOSIS — I495 Sick sinus syndrome: Secondary | ICD-10-CM | POA: Diagnosis not present

## 2020-12-11 DIAGNOSIS — I480 Paroxysmal atrial fibrillation: Secondary | ICD-10-CM

## 2020-12-11 DIAGNOSIS — Z79899 Other long term (current) drug therapy: Secondary | ICD-10-CM | POA: Diagnosis not present

## 2020-12-11 DIAGNOSIS — Z950 Presence of cardiac pacemaker: Secondary | ICD-10-CM | POA: Diagnosis not present

## 2020-12-11 LAB — PACEMAKER DEVICE OBSERVATION

## 2020-12-11 MED ORDER — AMIODARONE HCL 200 MG PO TABS
ORAL_TABLET | ORAL | 1 refills | Status: DC
Start: 2020-12-11 — End: 2021-01-09

## 2020-12-11 NOTE — Patient Instructions (Addendum)
Medication Instructions:  Your physician has recommended you make the following change in your medication:   1) DECREASE amiodarone 200 mg- Take 1/2 (one-half) tablet by mouth twice a day 5 days a week   (DO not skip 2 consecutive days in a row)  *If you need a refill on your cardiac medications before your next appointment, please call your pharmacy*   Lab Work: - none ordered  If you have labs (blood work) drawn today and your tests are completely normal, you will receive your results only by: Marland Kitchen MyChart Message (if you have MyChart) OR . A paper copy in the mail If you have any lab test that is abnormal or we need to change your treatment, we will call you to review the results.   Testing/Procedures: - Your physician has requested that you have a lexiscan myoview.   [place]  Your caregiver has ordered a Stress Test with nuclear imaging. The purpose of this test is to evaluate the blood supply to your heart muscle. This procedure is referred to as a "Non-Invasive Stress Test." This is because other than having an IV started in your vein, nothing is inserted or "invades" your body. Cardiac stress tests are done to find areas of poor blood flow to the heart by determining the extent of coronary artery disease (CAD). Some patients exercise on a treadmill, which naturally increases the blood flow to your heart, while others who are  unable to walk on a treadmill due to physical limitations have a pharmacologic/chemical stress agent called Lexiscan . This medicine will mimic walking on a treadmill by temporarily increasing your coronary blood flow.   Please note: these test may take anywhere between 2-4 hours to complete  PLEASE REPORT TO [place] AT THE FIRST DESK WILL DIRECT YOU WHERE TO GO  Date of Procedure:_____________________________________  Arrival Time for Procedure:______________________________  Instructions regarding medication:    __x__ : Hold METOPROLOL the night before & morning of your test  __x__: Hold FUROSEMIDE (LASIX) the morning of your test  _x___:  You may take the rest of your regular morning medications not listed above the morning of your test with enough water to get them down safely   PLEASE NOTIFY THE OFFICE AT LEAST 24 HOURS IN ADVANCE IF YOU ARE UNABLE TO [place].  [phone] AND  PLEASE NOTIFY NUCLEAR MEDICINE AT [facility] AT LEAST 24 HOURS IN ADVANCE IF YOU ARE UNABLE TO KEEP YOUR APPOINTMENT. [phone]  How to prepare for your Myoview test:  1. Do not eat or drink after midnight 2. No caffeine for 24 hours prior to test 3. No smoking 24 hours prior to test. 4. Your medication may be taken with water.  If your doctor stopped a medication because of this test, do not take that medication. 5. Ladies, please do not wear dresses.  Skirts or pants are appropriate. Please wear a short sleeve shirt. 6. No perfume, cologne or lotion. 7. Wear comfortable walking shoes. No heels!   Follow-Up: At [facility], you and your health needs are our priority.  As part of our continuing mission to provide you with exceptional heart care, we have created designated Provider Care Teams.  These Care Teams include your primary Cardiologist (physician) and Advanced Practice Providers (APPs -  Physician Assistants and Nurse Practitioners) who all work together to provide you with the care you need, when you need it.  We recommend signing up for the patient portal  called "MyChart".  Sign up information is provided on this After Visit Summary.  MyChart is used to connect with patients for Virtual Visits (Telemedicine).  Patients are able to view lab/test results, encounter notes, upcoming appointments, etc.  Non-urgent messages can be sent to your provider as well.   To learn more about what you can do with MyChart, go to [email or web address].    Your next appointment:   6 month(s)  The format  for your next appointment:   In Person  Provider:   Virl Axe, MD   Other Instructions  1) Wear an abdominal binder during your waking hours only to help prevent your significant drops in your blood pressure when changing position.    Cardiac Nuclear Scan A cardiac nuclear scan is a test that is done to check the flow of blood to your heart. It is done when you are resting and when you are exercising. The test looks for problems such as:  Not enough blood reaching a portion of the heart.  The heart muscle not working as it should. You may need this test if:  You have heart disease.  You have had lab results that are not normal.  You have had heart surgery or a balloon procedure to open up blocked arteries (angioplasty).  You have chest pain.  You have shortness of breath. In this test, a special dye (tracer) is put into your bloodstream. The tracer will travel to your heart. A camera will then take pictures of your heart to see how the tracer moves through your heart. This test is usually done at a hospital and takes 2-4 hours. Tell a doctor about:  Any allergies you have.  All medicines you are taking, including vitamins, herbs, eye drops, creams, and over-the-counter medicines.  Any problems you or family members have had with anesthetic medicines.  Any blood disorders you have.  Any surgeries you have had.  Any medical conditions you have.  Whether you are pregnant or may be pregnant. What are the risks? Generally, this is a safe test. However, problems may occur, such as:  Serious chest pain and heart attack. This is only a risk if the stress portion of the test is done.  Rapid heartbeat.  A feeling of warmth in your chest. This feeling usually does not last long.  Allergic reaction to the tracer. What happens before the test?  Ask your doctor about changing or stopping your normal medicines. This is important.  Follow instructions from your doctor  about what you cannot eat or drink.  Remove your jewelry on the day of the test. What happens during the test?  An IV tube will be inserted into one of your veins.  Your doctor will give you a small amount of tracer through the IV tube.  You will wait for 20-40 minutes while the tracer moves through your bloodstream.  Your heart will be monitored with an electrocardiogram (ECG).  You will lie down on an exam table.  Pictures of your heart will be taken for about 15-20 minutes.  You may also have a stress test. For this test, one of these things may be done: ? You will be asked to exercise on a treadmill or a stationary bike. ? You will be given medicines that will make your heart work harder. This is done if you are unable to exercise.  When blood flow to your heart has peaked, a tracer will again be given through the IV tube.  After  20-40 minutes, you will get back on the exam table. More pictures will be taken of your heart.  Depending on the tracer that is used, more pictures may need to be taken 3-4 hours later.  Your IV tube will be removed when the test is over. The test may vary among doctors and hospitals. What happens after the test?  Ask your doctor: ? Whether you can return to your normal schedule, including diet, activities, and medicines. ? Whether you should drink more fluids. This will help to remove the tracer from your body. Drink enough fluid to keep your pee (urine) pale yellow.  Ask your doctor, or the department that is doing the test: ? When will my results be ready? ? How will I get my results? Summary  A cardiac nuclear scan is a test that is done to check the flow of blood to your heart.  Tell your doctor whether you are pregnant or may be pregnant.  Before the test, ask your doctor about changing or stopping your normal medicines. This is important.  Ask your doctor whether you can return to your normal activities. You may be asked to drink more  fluids. This information is not intended to replace advice given to you by your health care provider. Make sure you discuss any questions you have with your health care provider. Document Revised: 10/27/2018 Document Reviewed: 12/21/2017 Elsevier Patient Education  [place].

## 2020-12-11 NOTE — Progress Notes (Signed)
Patient Care Team: Leone Haven, MD as PCP - General (Family Medicine) Rockey Situ Kathlene November, MD as PCP - Cardiology (Cardiology) [name], MD as Consulting Physician (Cardiology) Cammie Sickle, MD as Medical Oncologist (Hematology and Oncology)   HPI  [name] is a 75 y.o. male Seen in follow-up for atrial fibrillation managed with ELIQUIS and amiodarone. He was seen in August 2017 with significant sinus bradycardia and underwent pacing received an MRI compatible device He also has coronary artery disease with prior bypass surgery  The patient denies nocturnal dyspnea, orthopnea or peripheral edema.  There have been no palpitations or  syncope.    Complaining of exertional shortness of breath accompanied variably by nonradiating chest discomfort.  Duration typically minutes.  Also "wobbliness "on his legs when he stands.  Sometimes needs to sit down.  Blood pressures at home typically 130-150s mostly on the lower side.   Patient denies symptoms of GI intolerance, sun sensitivity attributable to amiodarone.  Does have the instability as noted above    Date Cr Hgb TSH LFTs  7/18 0.99 13.2 1.15(4/18) 29   6/19 1.08 11.8  15  1/20 0.96 11.8  22  8/20 0.7 11.0  17  1/22 0.79 12.2 3.125 21      DATE TEST EF   2/17    Myoview   EF 50 % No ischemia  4/17    Echo   EF 60 %   7/21 Echo  55-60    .    Date Ap  8/21 240  11/21 320  5/22 322        Past Medical History:  Diagnosis Date  . Anxiety   . Arthritis   . Atrial fibrillation ([place])    a. Dx 2013, recurred 02/2014, CHA2DS2VASc = 3 -->placed on Eliquis;  b. 02/2014 Echo: EF 50-55%, mid and apical anterior septum and mid and apical inf septum are abnl, mild to mod Ao sclerosis w/o AS.  Marland Kitchen Cancer associated pain   . Chicken pox   . Chronic lymphocytic leukemia ([place])    a. Dx 02/2014.  Marland Kitchen CLL (chronic lymphocytic leukemia) ([place])   . Complication of anesthesia    History of  PTSD--do not  touch patient when waking up from surgery.  Marland Kitchen COPD (chronic obstructive pulmonary disease) ([place])   . Coronary artery disease    a. 04/2009 CABG x 3 (LIMA->LAD, VG->OM1, VG->PDA);  b. 09/2009 Cath: occluded VG x 2 w/ patent LIMA and L->R collats. EF 55%, mild antlat HK;  c. 10/2011 MV: EF 53%, no isch/infarct-->low risk.  Marland Kitchen Dysrhythmia    hx of a-fib  . GERD (gastroesophageal reflux disease)    occasional  . History of chemotherapy 2015-2016  . HOH (hard of hearing)    Bilateral Hearing Aids  . Hypertension   . Myocardial infarction ([place]) 2010  . OSA on CPAP    USE C-PAP  . Presence of permanent cardiac pacemaker 2017  . PTSD (post-traumatic stress disorder)   . PTSD (post-traumatic stress disorder)   . Pure hypercholesterolemia   . Rheumatic fever 1959  . 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 TIA (transient ischemic attack) 11/02/2015    Past Surgical History:  Procedure Laterality Date  . ABDOMINAL HERNIA REPAIR    . APPENDECTOMY  06/21/1985  . CARDIAC CATHETERIZATION  2010; 2011   ; Dr Fletcher Anon  . CORONARY ARTERY BYPASS GRAFT  04/2009   "CABG X3"  .  EP IMPLANTABLE DEVICE N/A 03/03/2016   Procedure: Pacemaker Implant;  Surgeon: Deboraha Sprang, MD;  Location: [facility];  Service: Cardiovascular;  Laterality: N/A;  . FOREIGN BODY REMOVAL  1968   "shrapnel in my tailbone"  . INGUINAL HERNIA REPAIR Right   . INSERT / REPLACE / REMOVE PACEMAKER    . JOINT REPLACEMENT Right 2018  . LAPAROSCOPIC CHOLECYSTECTOMY    . TONSILLECTOMY AND ADENOIDECTOMY  1956  . TOTAL HIP ARTHROPLASTY Right 10/22/2016   Procedure: TOTAL HIP ARTHROPLASTY;  Surgeon: Dereck Leep, MD;  Location: [facility];  Service: Orthopedics;  Laterality: Right;  . TOTAL HIP ARTHROPLASTY Left 11/04/2017   Procedure: TOTAL HIP ARTHROPLASTY;  Surgeon: Dereck Leep, MD;  Location: [facility];  Service: Orthopedics;  Laterality: Left;    Current Outpatient Medications  Medication Sig Dispense Refill   . acetaminophen (TYLENOL) 500 MG tablet Take 1,000 mg by mouth every 8 (eight) hours as needed for mild pain.    Marland Kitchen acyclovir (ZOVIRAX) 400 MG tablet Take 1 tablet (400 mg total) by mouth 2 (two) times daily. 60 tablet 6  . albuterol (PROVENTIL HFA;VENTOLIN HFA) 108 (90 Base) MCG/ACT inhaler Inhale 2 puffs into the lungs every 6 (six) hours as needed for wheezing or shortness of breath. 1 Inhaler 11  . amiodarone (PACERONE) 200 MG tablet Take 1/2 (one-half) tablet by mouth twice daily 90 tablet 1  . apixaban (ELIQUIS) 5 MG TABS tablet Take 5 mg by mouth 2 (two) times daily.    Marland Kitchen atorvastatin (LIPITOR) 80 MG tablet Take 80 mg by mouth daily.    Marland Kitchen azelastine (ASTELIN) 0.1 % nasal spray Place 2 sprays into both nostrils 2 (two) times daily. Use in each nostril as directed 30 mL 3  . budesonide-formoterol (SYMBICORT) 160-4.5 MCG/ACT inhaler Inhale 2 puffs into the lungs 2 (two) times daily. 1 Inhaler 11  . cetirizine (ZYRTEC) 10 MG tablet Take 10 mg by mouth daily as needed for allergies.     . Coenzyme Q10 (COQ10) 200 MG CAPS Take 200 mg by mouth daily.    Marland Kitchen ezetimibe (ZETIA) 10 MG tablet Take 1 tablet (10 mg total) by mouth daily. 90 tablet 3  . fluticasone-salmeterol (ADVAIR) 100-50 MCG/ACT AEPB INHALE 1 PUFF TWO TIMES A DAY **REPLACES SYMBICORT INHALER**    . furosemide (LASIX) 20 MG tablet Take 1 tablet (20 mg) by mouth twice daily as needed     . Krill Oil 350 MG CAPS Take 350 mg by mouth as needed.     . mirtazapine (REMERON) 15 MG tablet Take 15 mg by mouth at bedtime as needed (for panic associated with PTSD).     . Multiple Vitamin (MULTIVITAMIN WITH MINERALS) TABS tablet Take 1 tablet by mouth daily.    . nitroGLYCERIN (NITROSTAT) 0.4 MG SL tablet Place 1 tablet (0.4 mg total) under the tongue every 5 (five) minutes as needed for chest pain. 25 tablet 6  . Tiotropium Bromide Monohydrate 2.5 MCG/ACT AERS INHALE 2 INHALATIONS BY ORAL INHALATION EVERY DAY    . traMADol (ULTRAM) 50 MG tablet  Take 1 tablet (50 mg total) by mouth every 12 (twelve) hours as needed. 60 tablet 0  . venetoclax (VENCLEXTA) 100 MG tablet TAKE 1 TABLET BY MOUTH ONCE DAILY 30 tablet 0  . metoprolol tartrate (LOPRESSOR) 50 MG tablet Take 1 tablet (50 mg total) by mouth 2 (two) times daily. 180 tablet 1   No current facility-administered medications for this visit.  No Known Allergies    Review of Systems negative except from HPI and PMH  Physical Exam BP (!) 160/92 (BP Location: Left Arm, Patient Position: Sitting, Cuff Size: Normal)   Pulse 66   Ht 5\' 9"  (1.753 m)   Wt 222 lb (100.7 kg)   SpO2 97%   BMI 32.78 kg/m  Well developed and well nourished in no acute distress HENT normal Neck supple   Clear Device pocket well healed; without hematoma or erythema.  There is no tethering  Regular rate and rhythm, 2/6 murmur Abd-soft with active BS No Clubbing cyanosis t4 edema Skin-warm and dry A & Oriented  Grossly normal sensory and motor function  ECG AV paced at 66 Intervals 32/110/45    Assessment and  Plan  Sinus node dysfunction With chronotropic incompetence  First-degree AV block  Paroxysmal atrial fibrillation  CAD s/p CABG  High Risk Medication Surveillance - Amiodarone  Dyspnea on exertion  Pacemaker-[employer]  Hypertension  CLL recurring   No interval atrial fibrillation.  Amiodarone surveillance laboratories were normal 1/22.  I wonder to what degree amiodarone may be contributing to some of his "wobbliness ".  We will plan to decrease amiodarone to 5 days a week i.e. 1000 mg a week.  No overt bleeding.  We will continue his Eliquis at 5 mg twice daily.  Blood pressures have been elevated here but at home he says they are okay.  Right now we will continue him on his metoprolol 50 twice daily.  I am concerned about his exertional dyspnea and concomitant chest pain.  He has known coronary disease.  I suspect progression of coronary disease.  We will undertake  Myoview scanning.  This would inform therapeutic options which would also potentially be able to be used to decrease blood pressure.  Last LDL was in range at 56; will continue atorvastatin 80 and Zetia  According to him his CLL is under control Current medicines are reviewed at length with the patient today .  The patient does not  have concerns regarding medicines.

## 2020-12-21 ENCOUNTER — Ambulatory Visit: Payer: Medicare HMO | Admitting: Cardiovascular Disease

## 2020-12-21 ENCOUNTER — Other Ambulatory Visit: Payer: Self-pay

## 2020-12-21 ENCOUNTER — Encounter: Payer: Self-pay | Admitting: Cardiovascular Disease

## 2020-12-21 VITALS — BP 143/77 | HR 61 | Ht 69.0 in | Wt 223.5 lb

## 2020-12-21 DIAGNOSIS — E785 Hyperlipidemia, unspecified: Secondary | ICD-10-CM

## 2020-12-21 DIAGNOSIS — I480 Paroxysmal atrial fibrillation: Secondary | ICD-10-CM | POA: Diagnosis not present

## 2020-12-21 DIAGNOSIS — I495 Sick sinus syndrome: Secondary | ICD-10-CM | POA: Diagnosis not present

## 2020-12-21 DIAGNOSIS — I10 Essential (primary) hypertension: Secondary | ICD-10-CM

## 2020-12-21 DIAGNOSIS — I25118 Atherosclerotic heart disease of native coronary artery with other forms of angina pectoris: Secondary | ICD-10-CM | POA: Diagnosis not present

## 2020-12-21 DIAGNOSIS — I739 Peripheral vascular disease, unspecified: Secondary | ICD-10-CM

## 2020-12-21 NOTE — Progress Notes (Signed)
. Cardiology Office Note  Date:  12/21/2020   ID:  ZAKAR BROSCH, DOB May 02, 1946, MRN [id number]  PCP:  Leone Haven, MD   Chief Complaint  Patient presents with  . 6 month follow up     Patient c/o blood pressure fluctuating and has shortness of breath with little to no exertion. Medications reviewed by the patient verbally.     HPI:  Mr. Torre is a 75 year old gentleman with a history of  smoking, coronary artery disease,  cabg,  catheterization March 2011 with occluded vein graft 2, patent LIMA, collaterals from left to right,  atrial fibrillation March 2013, 10/11/2014. Converted to normal with metoprolol,  Possible conversion pause. 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  LOV 05/2020 Seen by Dr. Rico Sheehan amiodarone to 5 days a week i.e. 1000 mg a week  BP at home,  88/41 yesteday, working in the heat, sat in the cool in the house, drank fluids,  Repeat pressure 98/46 2 hours later, 108/60 Much later 329 systolic  Gait instability, terrible leg weakness, leg cramps On lipitor 80.  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  Ectatic abdominal aorta at risk for aneurysm development.    Echocardiogram 02/04/20 EF 55 to 60%, no RWMA, mild LVH, grade 1 diastolic dysfunction, RV normal size and function, mildly elevated PASP with RV systolic pressure 92.4 mmHg, mild MR, mild to moderate aortic valve sclerosis without stenosis.    Chronic leg pain, now  on tramadol BID "from cancer medication"  Reports long history of bad muscle pain in thighs, No better with dexamethasone constant day and night,   Completed PT for 3 months with no relief Does not feel that his balance is much better  No palpitations concerning for atrial fib Chronic SOB walking up from barn, albuterol helps  finished cancer med infusion Plan for CT scan  Prior hematuria "a lot" WBC 252 eliquis held for a time Has PTSD, bad dreams He has seen psychiatry in the past which has helped  Left total hip on the right 10/22/16  fell off a ladder beginning of October 2017 CT at [facility]: 04/25/2016 showing large hematoma Right gluteal intramuscular hematoma  measuring 9.6 x 6.8 x 4.9 cm [facility] stopped eliquis, statin (unclear why they stopped his statin)  Working with the [facility] for PTSD, reports feeling well, stable Reports that he is been compliant with his Lasix, taking 80 mg daily which is higher dose than before for the past 5 days, no improvement in his leg swelling  atrial fibrillation/flutter starting September 12 2015, then persistent flutter In the clinic his heart rate was 126 bpm Amiodarone dosing was increased, amlodipine was changed to diltiazem 120 mg daily, metoprolol increased up to 25 mill grams twice a day Converted to normal sinus rhythm at home  tachycardia concerning for atrial fibrillation 05/10/2015. Lasted only several minutes Rate possibly up to 140 bpm.  Previously has been very active, building a barn, tool shed, taking care of animals some benefits from  [facility] for agent orange exposure and his cancer  Previous catheterization showing occluded LAD in the mid vessel, 60% left main, 99% distal RCA who was sent to [facility] in October 2010 for bypass surgery. He received 3 vessel bypass by Dr.Gerhardt with a LIMA to the LAD, vein graft to the OM1 and vein graft to the PDA, with subsequent cardiac catheter March 2011 for chest discomfort  showing occluded vein grafts x2 with patent LIMA. He has collateral vessels from left to right. Ejection fraction 55% mild anterolateral hypokinesis.  PMH:   has a past medical history of Anxiety, Arthritis, Atrial fibrillation ([place]), Cancer associated pain, Chicken pox, Chronic lymphocytic leukemia ([place]), CLL (chronic lymphocytic leukemia) ([place]), Complication of anesthesia, COPD (chronic obstructive pulmonary disease) ([place]), Coronary artery disease, Dysrhythmia, GERD (gastroesophageal reflux disease), History of chemotherapy (2015-2016), HOH (hard of hearing), Hypertension, Myocardial infarction ([place]) (2010), OSA on CPAP, Presence of permanent cardiac pacemaker (2017), PTSD (post-traumatic stress disorder), PTSD (post-traumatic stress disorder), Pure hypercholesterolemia, Rheumatic fever (1959), 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  PSH:    Past Surgical History:  Procedure Laterality Date  . ABDOMINAL HERNIA REPAIR    . APPENDECTOMY  06/21/1985  . CARDIAC CATHETERIZATION  2010; 2011   ; Dr Fletcher Anon  . CORONARY ARTERY BYPASS GRAFT  04/2009   "CABG X3"  . EP IMPLANTABLE DEVICE N/A 03/03/2016   Procedure: Pacemaker Implant;  Surgeon: Deboraha Sprang, MD;  Location: [place] [facility];  Service: Cardiovascular;  Laterality: N/A;  . FOREIGN BODY REMOVAL  1968   "shrapnel in my tailbone"  . INGUINAL HERNIA REPAIR Right   . INSERT / REPLACE / REMOVE PACEMAKER    . JOINT REPLACEMENT Right 2018  . LAPAROSCOPIC CHOLECYSTECTOMY    . TONSILLECTOMY AND ADENOIDECTOMY  1956  . TOTAL HIP ARTHROPLASTY Right 10/22/2016   Procedure: TOTAL HIP ARTHROPLASTY;  Surgeon: Dereck Leep, MD;  Location: [facility];  Service: Orthopedics;  Laterality: Right;  . TOTAL HIP ARTHROPLASTY Left 11/04/2017   Procedure: TOTAL HIP ARTHROPLASTY;  Surgeon: Dereck Leep, MD;  Location: [facility];  Service: Orthopedics;  Laterality: Left;    Current Outpatient  Medications  Medication Sig Dispense Refill  . acetaminophen (TYLENOL) 500 MG tablet Take 1,000 mg by mouth every 8 (eight) hours as needed for mild pain.    Marland Kitchen acyclovir (ZOVIRAX) 400 MG tablet Take 1 tablet (400 mg total) by mouth 2 (two) times daily. 60 tablet 6  . albuterol (PROVENTIL HFA;VENTOLIN HFA) 108 (90 Base) MCG/ACT inhaler Inhale 2 puffs into the lungs every 6 (six) hours as needed for wheezing or shortness of breath. 1 Inhaler 11  . amiodarone (PACERONE) 200 MG tablet Take 1/2 (one-half) tablet by mouth twice a day 5 days a week 90 tablet 1  . apixaban (ELIQUIS) 5 MG TABS tablet Take 5 mg by mouth 2 (two) times daily.    Marland Kitchen atorvastatin (LIPITOR) 80 MG tablet Take 80 mg by mouth daily.    Marland Kitchen azelastine (ASTELIN) 0.1 % nasal spray Place 2 sprays into both nostrils 2 (two) times daily. Use in each nostril as directed 30 mL 3  . budesonide-formoterol (SYMBICORT) 160-4.5 MCG/ACT inhaler Inhale 2 puffs into the lungs 2 (two) times daily. 1 Inhaler 11  . cetirizine (ZYRTEC) 10 MG tablet Take 10 mg by mouth daily as needed for allergies.     . Coenzyme Q10 (COQ10) 200 MG CAPS Take 200 mg by mouth daily.    Marland Kitchen  ezetimibe (ZETIA) 10 MG tablet Take 1 tablet (10 mg total) by mouth daily. 90 tablet 3  . fluticasone-salmeterol (ADVAIR) 100-50 MCG/ACT AEPB INHALE 1 PUFF TWO TIMES A DAY **REPLACES SYMBICORT INHALER**    . furosemide (LASIX) 20 MG tablet Take 1 tablet (20 mg) by mouth twice daily as needed     . Krill Oil 350 MG CAPS Take 350 mg by mouth as needed.     . metoprolol tartrate (LOPRESSOR) 50 MG tablet Take 1 tablet (50 mg total) by mouth 2 (two) times daily. 180 tablet 1  . mirtazapine (REMERON) 15 MG tablet Take 15 mg by mouth at bedtime as needed (for panic associated with PTSD).     . Multiple Vitamin (MULTIVITAMIN WITH MINERALS) TABS tablet Take 1 tablet by mouth daily.    . nitroGLYCERIN (NITROSTAT) 0.4 MG SL tablet Place 1 tablet (0.4 mg total) under the tongue every 5 (five) minutes  as needed for chest pain. 25 tablet 6  . Tiotropium Bromide Monohydrate 2.5 MCG/ACT AERS INHALE 2 INHALATIONS BY ORAL INHALATION EVERY DAY    . traMADol (ULTRAM) 50 MG tablet Take 1 tablet (50 mg total) by mouth every 12 (twelve) hours as needed. 60 tablet 0  . venetoclax (VENCLEXTA) 100 MG tablet TAKE 1 TABLET BY MOUTH ONCE DAILY 30 tablet 0   No current facility-administered medications for this visit.     Allergies:   Patient has no known allergies.   Social History:  The patient  reports that he quit smoking about 14 years ago. His smoking use included cigarettes. He has a 40.00 pack-year smoking history. He has never used smokeless tobacco. He reports previous alcohol use. He reports that he does not use drugs.   Family History:   family history includes Coronary artery disease in an other family member; Heart attack in his mother; Heart disease in his mother.    Review of Systems: Review of Systems  Constitutional: Negative.   HENT: Negative.   Respiratory: Negative.   Cardiovascular: Negative.   Gastrointestinal: Negative.   Musculoskeletal: Negative.        Leg pain  Neurological: Negative.   Psychiatric/Behavioral: Negative.   All other systems reviewed and are negative.   PHYSICAL EXAM: VS:  BP (!) 143/77 (BP Location: Left Arm, Patient Position: Sitting, Cuff Size: Normal)   Pulse 61   Ht 5\' 9"  (1.753 m)   Wt 223 lb 8 oz (101.4 kg)   SpO2 99%   BMI 33.01 kg/m  , BMI Body mass index is 33.01 kg/m. Constitutional:  oriented to person, place, and time. No distress.  HENT:  Head: Grossly normal Eyes:  no discharge. No scleral icterus.  Neck: No JVD, no carotid bruits  Cardiovascular: Regular rate and rhythm, no murmurs appreciated Pulmonary/Chest: Clear to auscultation bilaterally, no wheezes or rails Abdominal: Soft.  no distension.  no tenderness.  Musculoskeletal: Normal range of motion Neurological:  normal muscle tone. Coordination normal. No atrophy Skin:  Skin warm and dry Psychiatric: normal affect, pleasant   Recent Labs: 03/14/2020: B Natriuretic Peptide 93.9 08/16/2020: TSH 3.125 11/15/2020: ALT 27; BUN 17; Creatinine, Ser 0.98; Hemoglobin 12.1; Platelets 139; Potassium 4.0; Sodium 142    Lipid Panel Lab Results  Component Value Date   CHOL 111 02/04/2020   HDL 36 (L) 02/04/2020   LDLCALC 56 02/04/2020   TRIG 95 02/04/2020      Wt Readings from Last 3 Encounters:  12/21/20 223 lb 8 oz (101.4 kg)  12/11/20 222  lb (100.7 kg)  11/15/20 224 lb 12.8 oz (102 kg)     ASSESSMENT AND PLAN: Atrial fibrillation, unspecified type (HCC)  Maintaining normal sinus rhythm, continue amiodarone, metoprolol  On eliquis, no bleeding  HYPERCHOLESTEROLEMIA Currently on zetia , lipitor Terrible leg pain, cramps, Hold lipitor for 1 month, continue to hold if pain better  Essential hypertension Blood pressure is well controlled on today's visit. No changes made to the medications. 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 No further workup at this time. Continue current medication regimen. Dr. Caryl Comes set up stress test next week  Chronic diastolic CHF (congestive heart failure) ([place]) Very rare lasix No swelling  Centrilobular emphysema (HCC) Stable, SOB  Sinus pause  pacemaker placement, reports having occasional dropout or palpitation Pacer downloads reviewed, no significant arrhythmia  Leg pain No extremity Doppler fine, ABIs normal On tramadol Hold lipitor for now   Total encounter time more than 25 minutes  Greater than 50% was spent in counseling and coordination of care with the patient   No orders of the defined types were placed in this encounter.    Signed, Esmond Plants, M.D., Ph.D. 12/21/2020  [place], [place]

## 2020-12-21 NOTE — Patient Instructions (Signed)
Medication Instructions:  Hold the atorvastatin for 1 month See if cramps and leg weakness gets better  If you need a refill on your cardiac medications before your next appointment, please call your pharmacy.    Lab work: No new labs needed   If you have labs (blood work) drawn today and your tests are completely normal, you will receive your results only by: Marland Kitchen MyChart Message (if you have MyChart) OR . A paper copy in the mail If you have any lab test that is abnormal or we need to change your treatment, we will call you to review the results.   Testing/Procedures: No new testing needed   Follow-Up: At [facility], you and your health needs are our priority.  As part of our continuing mission to provide you with exceptional heart care, we have created designated Provider Care Teams.  These Care Teams include your primary Cardiologist (physician) and Advanced Practice Providers (APPs -  Physician Assistants and Nurse Practitioners) who all work together to provide you with the care you need, when you need it.  . You will need a follow up appointment in 6 months  . Providers on your designated Care Team:   . Murray Hodgkins, NP . Christell Faith, PA-C . Marrianne Mood, PA-C  Any Other Special Instructions Will Be Listed Below (If Applicable).  COVID-19 Vaccine Information can be found at: ShippingScam.co.uk For questions related to vaccine distribution or appointments, please email [email or web address] .[email or web address] or call [phone])[phone].

## 2020-12-24 ENCOUNTER — Telehealth: Payer: Self-pay | Admitting: Family Medicine

## 2020-12-24 NOTE — Telephone Encounter (Signed)
Transferred to [place]   PT called in stating arm is swollen from being stung by Wasp. PT stated no other symptoms and advise Dr.Sonnenberg had nothing available for today.

## 2020-12-25 ENCOUNTER — Ambulatory Visit (INDEPENDENT_AMBULATORY_CARE_PROVIDER_SITE_OTHER): Payer: Medicare HMO | Admitting: Family Medicine

## 2020-12-25 ENCOUNTER — Telehealth: Payer: Self-pay

## 2020-12-25 ENCOUNTER — Other Ambulatory Visit: Payer: Self-pay

## 2020-12-25 ENCOUNTER — Encounter
Admission: RE | Admit: 2020-12-25 | Discharge: 2020-12-25 | Disposition: A | Discharge status: Home / Self Care | Payer: Medicare HMO | Source: Ambulatory Visit | Attending: Internal Medicine | Admitting: Internal Medicine

## 2020-12-25 ENCOUNTER — Encounter: Payer: Self-pay | Admitting: Family Medicine

## 2020-12-25 VITALS — BP 164/84 | HR 74 | Temp 97.8°F | Wt 223.4 lb

## 2020-12-25 DIAGNOSIS — T63461A Toxic effect of venom of wasps, accidental (unintentional), initial encounter: Secondary | ICD-10-CM | POA: Insufficient documentation

## 2020-12-25 DIAGNOSIS — R072 Precordial pain: Secondary | ICD-10-CM

## 2020-12-25 LAB — NM MYOCAR MULTI W/SPECT W/WALL MOTION / EF
Estimated workload: 1 METS
Exercise duration (min): 0 min
Exercise duration (sec): 0 s
LV dias vol: 125 mL (ref 62–150)
LV sys vol: 47 mL
MPHR: 145 {beats}/min
Peak HR: 63 {beats}/min
Percent HR: 43 %
Rest HR: 60 {beats}/min
SDS: 1
SRS: 11
SSS: 5
TID: 1.04

## 2020-12-25 MED ORDER — TECHNETIUM TC 99M TETROFOSMIN IV KIT
30.0600 | PACK | Freq: Once | INTRAVENOUS | Status: AC | PRN
  Administered 2020-12-25: 30.06 via INTRAVENOUS

## 2020-12-25 MED ORDER — REGADENOSON 0.4 MG/5ML IV SOLN
0.4000 mg | Freq: Once | INTRAVENOUS | Status: AC
  Administered 2020-12-25: 0.4 mg via INTRAVENOUS
  Filled 2020-12-25: qty 5

## 2020-12-25 MED ORDER — PREDNISONE 20 MG PO TABS: 40.0000 mg | ORAL_TABLET | Freq: Once | ORAL | 0 refills | Status: AC

## 2020-12-25 MED ORDER — CEPHALEXIN 500 MG PO CAPS: 500.0000 mg | ORAL_CAPSULE | Freq: Four times a day (QID) | ORAL | 0 refills | Status: DC

## 2020-12-25 MED ORDER — TECHNETIUM TC 99M TETROFOSMIN IV KIT
10.6500 | PACK | Freq: Once | INTRAVENOUS | Status: AC | PRN
  Administered 2020-12-25: 10.65 via INTRAVENOUS

## 2020-12-25 NOTE — Progress Notes (Signed)
[name], MD Phone: [phone]  [name] is a 75 y.o. male who presents today for same-day visit.  Wasp sting: This occurred on Sunday on his left elbow.  He notes the swelling was not bad at first though over the next day or 2 the swelling has worsened significantly.  He has swollen distally with overlying erythema to his forearm.  His hand is starting to swell some.  There is some pain though not significant.  He has good movement in his hand.  He has had no fevers.  He has tried Tylenol with little benefit.  He has had similar reactions in the past and has typically required antibiotics for likely cellulitis.  The patient notes no throat or tongue involvement.  No trouble breathing.  Social History   Tobacco Use  Smoking Status Former Smoker  . Packs/day: 1.00  . Years: 40.00  . Pack years: 40.00  . Types: Cigarettes  . Quit date: 07/21/2006  . Years since quitting: 14.4  Smokeless Tobacco Never Used    Current Outpatient Medications on File Prior to Visit  Medication Sig Dispense Refill  . acetaminophen (TYLENOL) 500 MG tablet Take 1,000 mg by mouth every 8 (eight) hours as needed for mild pain.    Marland Kitchen acyclovir (ZOVIRAX) 400 MG tablet Take 1 tablet (400 mg total) by mouth 2 (two) times daily. 60 tablet 6  . albuterol (PROVENTIL HFA;VENTOLIN HFA) 108 (90 Base) MCG/ACT inhaler Inhale 2 puffs into the lungs every 6 (six) hours as needed for wheezing or shortness of breath. 1 Inhaler 11  . amiodarone (PACERONE) 200 MG tablet Take 1/2 (one-half) tablet by mouth twice a day 5 days a week 90 tablet 1  . apixaban (ELIQUIS) 5 MG TABS tablet Take 5 mg by mouth 2 (two) times daily.    Marland Kitchen atorvastatin (LIPITOR) 80 MG tablet Take 80 mg by mouth daily.    Marland Kitchen azelastine (ASTELIN) 0.1 % nasal spray Place 2 sprays into both nostrils 2 (two) times daily. Use in each nostril as directed 30 mL 3  . budesonide-formoterol (SYMBICORT) 160-4.5 MCG/ACT inhaler Inhale 2 puffs into the lungs 2  (two) times daily. 1 Inhaler 11  . cetirizine (ZYRTEC) 10 MG tablet Take 10 mg by mouth daily as needed for allergies.     . Coenzyme Q10 (COQ10) 200 MG CAPS Take 200 mg by mouth daily.    Marland Kitchen ezetimibe (ZETIA) 10 MG tablet Take 1 tablet (10 mg total) by mouth daily. 90 tablet 3  . fluticasone-salmeterol (ADVAIR) 100-50 MCG/ACT AEPB INHALE 1 PUFF TWO TIMES A DAY **REPLACES SYMBICORT INHALER**    . furosemide (LASIX) 20 MG tablet Take 1 tablet (20 mg) by mouth twice daily as needed     . Krill Oil 350 MG CAPS Take 350 mg by mouth as needed.     . mirtazapine (REMERON) 15 MG tablet Take 15 mg by mouth at bedtime as needed (for panic associated with PTSD).     . Multiple Vitamin (MULTIVITAMIN WITH MINERALS) TABS tablet Take 1 tablet by mouth daily.    . nitroGLYCERIN (NITROSTAT) 0.4 MG SL tablet Place 1 tablet (0.4 mg total) under the tongue every 5 (five) minutes as needed for chest pain. 25 tablet 6  . Tiotropium Bromide Monohydrate 2.5 MCG/ACT AERS INHALE 2 INHALATIONS BY ORAL INHALATION EVERY DAY    . traMADol (ULTRAM) 50 MG tablet Take 1 tablet (50 mg total) by mouth every 12 (twelve) hours as needed. 60 tablet 0  .  venetoclax (VENCLEXTA) 100 MG tablet TAKE 1 TABLET BY MOUTH ONCE DAILY 30 tablet 0  . metoprolol tartrate (LOPRESSOR) 50 MG tablet Take 1 tablet (50 mg total) by mouth 2 (two) times daily. 180 tablet 1   No current facility-administered medications on file prior to visit.     ROS see history of present illness  Objective  Physical Exam Vitals:   12/25/20 1100  BP: (!) 164/84  Pulse: 74  Temp: 97.8 F (36.6 C)    BP Readings from Last 3 Encounters:  12/25/20 (!) 164/84  12/21/20 (!) 143/77  12/11/20 (!) 160/92   Wt Readings from Last 3 Encounters:  12/25/20 223 lb 6.4 oz (101.3 kg)  12/21/20 223 lb 8 oz (101.4 kg)  12/11/20 222 lb (100.7 kg)    Physical Exam Constitutional:      General: He is not in acute distress.    Appearance: He is not diaphoretic.   Pulmonary:     Effort: Pulmonary effort is normal.  Neurological:     Mental Status: He is alert.    Left upper extremity, there is induration in the forearm dorsally, there is warmth, there is slight tenderness, site of staying is at lateral epicondyle with no apparent fluctuance or purulent drainage, the patient's arm and hand are warm and well-perfused, he has good grip strength    Assessment/Plan: Please see individual problem list.  Problem List Items Addressed This Visit    Wasp sting - Primary    Findings are concerning for cellulitis versus large local site reaction.  He does have a history of cellulitis in the past following a wasp sting.  We will proceed with empiric treatment with Keflex 500 mg 4 times daily.  We will also give him a dose of prednisone to help with swelling.  I will see him back in 3 days for recheck.  I advised that if this worsens at all while on the antibiotics he would need to be evaluated in the emergency department.      Relevant Medications   cephALEXin (KEFLEX) 500 MG capsule   predniSONE (DELTASONE) 20 MG tablet      Return in 3 days (on 12/28/2020) for Recheck of arm swelling.  This visit occurred during the SARS-CoV-2 public health emergency.  Safety protocols were in place, including screening questions prior to the visit, additional usage of staff PPE, and extensive cleaning of exam room while observing appropriate contact time as indicated for disinfecting solutions.    [name], MD [place]

## 2020-12-25 NOTE — Assessment & Plan Note (Signed)
Findings are concerning for cellulitis versus large local site reaction.  He does have a history of cellulitis in the past following a wasp sting.  We will proceed with empiric treatment with Keflex 500 mg 4 times daily.  We will also give him a dose of prednisone to help with swelling.  I will see him back in 3 days for recheck.  I advised that if this worsens at all while on the antibiotics he would need to be evaluated in the emergency department.

## 2020-12-25 NOTE — Telephone Encounter (Signed)
Spoken to patient, he is very upset because he stated he had an appointment for today but was canceled due to him saying he would go to [facility]. He went but left without being seen. He has sx of arm swelling, very painful to the touch, and very red. Patient believes he has an infection. After informing patient he did not have an appointment and had no opening here in our office until Thursday. Patient began to cuss and stated he will take care of it hisself and disconnected the call.

## 2020-12-25 NOTE — Telephone Encounter (Signed)
Did he get evaluated for this? If he did not was he offered an appointment with someone in the office? How are his symptoms currently?

## 2020-12-25 NOTE — Telephone Encounter (Signed)
Patient was seen in the office.

## 2020-12-25 NOTE — Patient Instructions (Signed)
Nice to see you. We will go ahead and place you on an antibiotic to cover for potential infection in your arm.  You will also take a dose of steroids to help reduce the swelling.  If you have any worsening symptoms over the next couple of days need to go to the emergency department for evaluation.

## 2020-12-27 ENCOUNTER — Other Ambulatory Visit: Payer: Self-pay

## 2020-12-27 ENCOUNTER — Inpatient Hospital Stay (HOSPITAL_BASED_OUTPATIENT_CLINIC_OR_DEPARTMENT_OTHER): Payer: Medicare HMO | Admitting: Internal Medicine

## 2020-12-27 ENCOUNTER — Encounter: Payer: Self-pay | Admitting: Internal Medicine

## 2020-12-27 ENCOUNTER — Inpatient Hospital Stay: Payer: Medicare HMO | Attending: Internal Medicine

## 2020-12-27 ENCOUNTER — Inpatient Hospital Stay: Payer: Medicare HMO

## 2020-12-27 DIAGNOSIS — C9110 Chronic lymphocytic leukemia of B-cell type not having achieved remission: Secondary | ICD-10-CM

## 2020-12-27 DIAGNOSIS — M7918 Myalgia, other site: Secondary | ICD-10-CM | POA: Diagnosis not present

## 2020-12-27 DIAGNOSIS — Z7901 Long term (current) use of anticoagulants: Secondary | ICD-10-CM | POA: Insufficient documentation

## 2020-12-27 DIAGNOSIS — E611 Iron deficiency: Secondary | ICD-10-CM | POA: Diagnosis not present

## 2020-12-27 DIAGNOSIS — R197 Diarrhea, unspecified: Secondary | ICD-10-CM | POA: Insufficient documentation

## 2020-12-27 DIAGNOSIS — D649 Anemia, unspecified: Secondary | ICD-10-CM | POA: Insufficient documentation

## 2020-12-27 DIAGNOSIS — I4891 Unspecified atrial fibrillation: Secondary | ICD-10-CM | POA: Diagnosis not present

## 2020-12-27 DIAGNOSIS — Z79899 Other long term (current) drug therapy: Secondary | ICD-10-CM | POA: Diagnosis not present

## 2020-12-27 LAB — COMPREHENSIVE METABOLIC PANEL
ALT: 19 U/L (ref 0–44)
AST: 20 U/L (ref 15–41)
Albumin: 3.7 g/dL (ref 3.5–5.0)
Alkaline Phosphatase: 90 U/L (ref 38–126)
Anion gap: 10 (ref 5–15)
BUN: 24 mg/dL — ABNORMAL HIGH (ref 8–23)
CO2: 23 mmol/L (ref 22–32)
Calcium: 8.5 mg/dL — ABNORMAL LOW (ref 8.9–10.3)
Chloride: 110 mmol/L (ref 98–111)
Creatinine, Ser: 0.89 mg/dL (ref 0.61–1.24)
GFR, Estimated: >60 mL/min (ref >60)
Glucose, Bld: 133 mg/dL — ABNORMAL HIGH (ref 70–99)
Potassium: 3.2 mmol/L — ABNORMAL LOW (ref 3.5–5.1)
Sodium: 143 mmol/L (ref 135–145)
Total Bilirubin: 0.8 mg/dL (ref 0.3–1.2)
Total Protein: 6.3 g/dL — ABNORMAL LOW (ref 6.5–8.1)

## 2020-12-27 LAB — CBC WITH DIFFERENTIAL/PLATELET
Abs Immature Granulocytes: 0.02 10*3/uL (ref 0.00–0.07)
Basophils Absolute: 0 10*3/uL (ref 0.0–0.1)
Basophils Relative: 0 %
Eosinophils Absolute: 0.1 10*3/uL (ref 0.0–0.5)
Eosinophils Relative: 2 %
HCT: 32.3 % — ABNORMAL LOW (ref 39.0–52.0)
Hemoglobin: 10.8 g/dL — ABNORMAL LOW (ref 13.0–17.0)
Immature Granulocytes: 0 %
Lymphocytes Relative: 32 %
Lymphs Abs: 1.8 10*3/uL (ref 0.7–4.0)
MCH: 34.5 pg — ABNORMAL HIGH (ref 26.0–34.0)
MCHC: 33.4 g/dL (ref 30.0–36.0)
MCV: 103.2 fL — ABNORMAL HIGH (ref 80.0–100.0)
Monocytes Absolute: 0.8 10*3/uL (ref 0.1–1.0)
Monocytes Relative: 14 %
Neutro Abs: 2.8 10*3/uL (ref 1.7–7.7)
Neutrophils Relative %: 52 %
Platelets: 125 10*3/uL — ABNORMAL LOW (ref 150–400)
RBC: 3.13 MIL/uL — ABNORMAL LOW (ref 4.22–5.81)
RDW: 14.2 % (ref 11.5–15.5)
WBC: 5.5 10*3/uL (ref 4.0–10.5)
nRBC: 0 % (ref 0.0–0.2)

## 2020-12-27 LAB — IRON AND TIBC
Iron: 113 ug/dL (ref 45–182)
Saturation Ratios: 33 % (ref 17.9–39.5)
TIBC: 346 ug/dL (ref 250–450)
UIBC: 233 ug/dL

## 2020-12-27 LAB — FERRITIN: Ferritin: 79 ng/mL (ref 24–336)

## 2020-12-27 LAB — LACTATE DEHYDROGENASE: LDH: 163 U/L (ref 98–192)

## 2020-12-27 NOTE — Progress Notes (Signed)
[facility] OFFICE PROGRESS NOTE  Patient Care Team: Leone Haven, MD as PCP - General (Family Medicine) Rockey Situ Kathlene November, MD as PCP - Cardiology (Cardiology) [name], MD as Consulting Physician (Cardiology) Cammie Sickle, MD as Medical Oncologist (Hematology and Oncology)  Cancer Staging No matching staging information was found for the patient.   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  MARCH 2017- CT N/C/A/P- NED. STOP Ritux; surveillance   # AUG 2019- CT/PET- progression/NO transformation;  # NOV 2019- Progression; started Ibrutinib 420 mg/d. STOPPED in end of feb sec to extreme fatigue/joint pains/cramps  #November 01, 2018-start ibrutinib 280 mg a day; December 20, 2018-discontinue ibrutinib secondary multiple side effects.   #January 03, 2019 start [place] [July]; finished Dec 2020-; started [facility] maintenance [200 mg a day; DI-amiodarone]  #Mid March 2021-venetoclax dose reduced to 100 mg [second diarrhea].   #? 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       INTERVAL HISTORY:  [name] 75 y.o.  male CLL-high risk currently on venetoclax is here for follow-up/ s/p IVIG x4 for low immunoglobulins.  No repeated infections.  Denies any hospitalizations.  Intermittent 1-2 loose stools every 3 to 4 days.  Not any worse.  Chronic joint pain/muscle pain.  No blood in stools or black or stools.   \Review of Systems  Constitutional:  Positive for malaise/fatigue. Negative for chills, diaphoresis and fever.  HENT:  Negative for nosebleeds and sore throat.   Eyes:  Negative for double vision.  Respiratory:  Negative for hemoptysis, shortness of breath and wheezing.   Cardiovascular:  Negative for chest pain, palpitations, orthopnea and leg swelling.  Gastrointestinal:  Positive for diarrhea. Negative for abdominal pain, blood in stool, constipation, heartburn, melena, nausea and vomiting.  Genitourinary:  Negative for dysuria, frequency and urgency.  Musculoskeletal:  Positive for back pain, joint pain and myalgias.  Skin: Negative.  Negative for itching and rash.  Neurological:  Negative for dizziness, tingling, focal weakness and headaches.  Psychiatric/Behavioral:  Negative for depression. The patient is not nervous/anxious and does not have insomnia.      PAST MEDICAL HISTORY :  Past Medical History:  Diagnosis Date  . Anxiety   . Arthritis   . Atrial fibrillation ([place])    a. Dx 2013, recurred 02/2014, CHA2DS2VASc = 3 -->placed on Eliquis;  b. 02/2014 Echo: EF 50-55%, mid and apical anterior septum and mid and apical inf septum are abnl, mild to mod Ao sclerosis  w/o AS.  Marland Kitchen Cancer associated pain   . Chicken pox   . Chronic lymphocytic leukemia ([place])    a. Dx 02/2014.  Marland Kitchen CLL (chronic lymphocytic leukemia) ([place])   . Complication of anesthesia    History of  PTSD--do not  touch patient when waking up from surgery.  Marland Kitchen COPD (chronic obstructive pulmonary disease) ([place])   . Coronary artery disease    a. 04/2009 CABG x 3 (LIMA->LAD, VG->OM1, VG->PDA);  b. 09/2009 Cath: occluded VG x 2 w/ patent LIMA and L->R collats. EF 55%, mild antlat HK;  c. 10/2011 MV: EF 53%, no isch/infarct-->low risk.  Marland Kitchen Dysrhythmia    hx of a-fib  . GERD (gastroesophageal reflux disease)    occasional  . History of chemotherapy 2015-2016  . HOH (hard of hearing)    Bilateral Hearing Aids  . Hypertension   . Myocardial infarction ([place]) 2010  . OSA on CPAP    USE C-PAP  . Presence of permanent cardiac pacemaker 2017  . PTSD (post-traumatic stress disorder)   . PTSD (post-traumatic stress disorder)   . Pure hypercholesterolemia   . Rheumatic fever 1959  . 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 TIA (transient ischemic attack) 11/02/2015    PAST SURGICAL HISTORY :   Past Surgical History:  Procedure Laterality Date  . ABDOMINAL HERNIA REPAIR    . APPENDECTOMY  06/21/1985  . CARDIAC CATHETERIZATION  2010; 2011   ; Dr Fletcher Anon  . CORONARY ARTERY BYPASS GRAFT  04/2009   "CABG X3"  . EP IMPLANTABLE DEVICE N/A 03/03/2016   Procedure: Pacemaker Implant;  Surgeon: Deboraha Sprang, MD;  Location: [facility];  Service: Cardiovascular;  Laterality: N/A;  . FOREIGN BODY REMOVAL  1968   "shrapnel in my tailbone"  . INGUINAL HERNIA REPAIR Right   . INSERT / REPLACE / REMOVE PACEMAKER    . JOINT REPLACEMENT Right 2018  . LAPAROSCOPIC CHOLECYSTECTOMY    . TONSILLECTOMY AND ADENOIDECTOMY  1956  . TOTAL HIP ARTHROPLASTY Right 10/22/2016   Procedure: TOTAL HIP ARTHROPLASTY;  Surgeon: Dereck Leep, MD;  Location: [facility];  Service: Orthopedics;  Laterality: Right;  . TOTAL HIP ARTHROPLASTY Left 11/04/2017   Procedure: TOTAL HIP ARTHROPLASTY;  Surgeon: Dereck Leep, MD;  Location: [facility];  Service: Orthopedics;  Laterality: Left;    FAMILY HISTORY :   Family History   Problem Relation Age of Onset  . Heart disease Mother   . Heart attack Mother   . Coronary artery disease Other        family history    SOCIAL HISTORY:   Social History   Tobacco Use  . Smoking status: Former    Packs/day: 1.00    Years: 40.00    Pack years: 40.00    Types: Cigarettes    Quit date: 07/21/2006    Years since quitting: 14.4  . Smokeless tobacco: Never  Vaping Use  . Vaping Use: Former  Substance Use Topics  . Alcohol use: Not Currently  . Drug use: No    ALLERGIES:  has No Known Allergies.  MEDICATIONS:  Current Outpatient Medications  Medication Sig Dispense Refill  . acetaminophen (TYLENOL) 500 MG tablet Take 1,000 mg by mouth every 8 (eight) hours as needed for mild pain.    Marland Kitchen acyclovir (ZOVIRAX) 400 MG tablet Take 1 tablet (400 mg total) by mouth 2 (two) times daily. 60 tablet 6  . albuterol (PROVENTIL HFA;VENTOLIN HFA) 108 (90 Base)  MCG/ACT inhaler Inhale 2 puffs into the lungs every 6 (six) hours as needed for wheezing or shortness of breath. 1 Inhaler 11  . apixaban (ELIQUIS) 5 MG TABS tablet Take 5 mg by mouth 2 (two) times daily.    Marland Kitchen azelastine (ASTELIN) 0.1 % nasal spray Place 2 sprays into both nostrils 2 (two) times daily. Use in each nostril as directed 30 mL 3  . budesonide-formoterol (SYMBICORT) 160-4.5 MCG/ACT inhaler Inhale 2 puffs into the lungs 2 (two) times daily. 1 Inhaler 11  . cephALEXin (KEFLEX) 500 MG capsule Take 1 capsule (500 mg total) by mouth 4 (four) times daily. 28 capsule 0  . cetirizine (ZYRTEC) 10 MG tablet Take 10 mg by mouth daily as needed for allergies.     . Coenzyme Q10 (COQ10) 200 MG CAPS Take 200 mg by mouth daily.    Marland Kitchen ezetimibe (ZETIA) 10 MG tablet Take 1 tablet (10 mg total) by mouth daily. 90 tablet 3  . fluticasone-salmeterol (ADVAIR) 100-50 MCG/ACT AEPB INHALE 1 PUFF TWO TIMES A DAY **REPLACES SYMBICORT INHALER**    . furosemide (LASIX) 20 MG tablet Take 1 tablet (20 mg) by mouth twice daily as needed     .  Krill Oil 350 MG CAPS Take 350 mg by mouth as needed.     . mirtazapine (REMERON) 15 MG tablet Take 15 mg by mouth at bedtime as needed (for panic associated with PTSD).     . Multiple Vitamin (MULTIVITAMIN WITH MINERALS) TABS tablet Take 1 tablet by mouth daily.    . nitroGLYCERIN (NITROSTAT) 0.4 MG SL tablet Place 1 tablet (0.4 mg total) under the tongue every 5 (five) minutes as needed for chest pain. 25 tablet 6  . Tiotropium Bromide Monohydrate 2.5 MCG/ACT AERS INHALE 2 INHALATIONS BY ORAL INHALATION EVERY DAY    . traMADol (ULTRAM) 50 MG tablet Take 1 tablet (50 mg total) by mouth every 12 (twelve) hours as needed. 60 tablet 0  . amiodarone (PACERONE) 200 MG tablet TAKE 1/2 TABLET BY MOUTH 5 DAYS PER WEEK 33 tablet 0  . atorvastatin (LIPITOR) 80 MG tablet Take 80 mg by mouth daily. (Patient not taking: Reported on 12/27/2020)    . metoprolol tartrate (LOPRESSOR) 50 MG tablet Take 1 tablet (50 mg total) by mouth 2 (two) times daily. 180 tablet 1  . predniSONE (DELTASONE) 20 MG tablet Take 40 mg by mouth once.    . venetoclax (VENCLEXTA) 100 MG tablet TAKE 1 TABLET BY MOUTH ONCE DAILY 30 tablet 0   No current facility-administered medications for this visit.    PHYSICAL EXAMINATION: ECOG PERFORMANCE STATUS: 1 - Symptomatic but completely ambulatory  BP (!) 172/99 (BP Location: Right Arm, Patient Position: Sitting)   Pulse 77   Temp (!) 97.5 F (36.4 C) (Tympanic)   Wt 227 lb 12.8 oz (103.3 kg)   SpO2 99%   BMI 33.64 kg/m   Filed Weights   12/27/20 0833  Weight: 227 lb 12.8 oz (103.3 kg)    Physical Exam Constitutional:      Comments: Patient is alone.  HENT:     Head: Normocephalic and atraumatic.     Mouth/Throat:     Pharynx: No oropharyngeal exudate.  Eyes:     Pupils: Pupils are equal, round, and reactive to light.  Cardiovascular:     Rate and Rhythm: Normal rate and regular rhythm.  Pulmonary:     Effort: No respiratory distress.     Breath sounds: No wheezing.   Abdominal:  General: Bowel sounds are normal. There is no distension.     Palpations: Abdomen is soft. There is no mass.     Tenderness: no abdominal tenderness There is no guarding or rebound.  Musculoskeletal:        General: No tenderness. Normal range of motion.     Cervical back: Normal range of motion and neck supple.  Lymphadenopathy:     Comments: Resolved lymph nodes in the neck underarms.  Skin:    General: Skin is warm.  Neurological:     Mental Status: He is alert and oriented to person, place, and time.  Psychiatric:        Mood and Affect: Affect normal.    LABORATORY DATA:  I have reviewed the data as listed    Component Value Date/Time   NA 143 12/27/2020 0756   NA 139 10/11/2014 1800   K 3.2 (L) 12/27/2020 0756   K 3.3 (L) 10/11/2014 1800   CL 110 12/27/2020 0756   CL 106 10/11/2014 1800   CO2 23 12/27/2020 0756   CO2 27 10/11/2014 1800   GLUCOSE 133 (H) 12/27/2020 0756   GLUCOSE 107 (H) 10/11/2014 1800   BUN 24 (H) 12/27/2020 0756   BUN 15 10/11/2014 1800   CREATININE 0.89 12/27/2020 0756   CREATININE 0.89 10/11/2014 1800   CALCIUM 8.5 (L) 12/27/2020 0756   CALCIUM 8.8 (L) 10/11/2014 1800   PROT 6.3 (L) 12/27/2020 0756   PROT 6.7 05/18/2017 1048   PROT 6.4 (L) 10/11/2014 1800   ALBUMIN 3.7 12/27/2020 0756   ALBUMIN 4.3 05/18/2017 1048   ALBUMIN 4.1 10/11/2014 1800   AST 20 12/27/2020 0756   AST 23 10/11/2014 1800   ALT 19 12/27/2020 0756   ALT 22 10/11/2014 1800   ALKPHOS 90 12/27/2020 0756   ALKPHOS 61 10/11/2014 1800   BILITOT 0.8 12/27/2020 0756   BILITOT 0.7 05/18/2017 1048   BILITOT 0.9 10/11/2014 1800   GFRNONAA >60 12/27/2020 0756   GFRNONAA >60 10/11/2014 1800   GFRAA >60 04/12/2020 0959   GFRAA >60 10/11/2014 1800    No results found for: SPEP, UPEP  Lab Results  Component Value Date   WBC 5.5 12/27/2020   NEUTROABS 2.8 12/27/2020   HGB 10.8 (L) 12/27/2020   HCT 32.3 (L) 12/27/2020   MCV 103.2 (H) 12/27/2020   PLT 125  (L) 12/27/2020      Chemistry      Component Value Date/Time   NA 143 12/27/2020 0756   NA 139 10/11/2014 1800   K 3.2 (L) 12/27/2020 0756   K 3.3 (L) 10/11/2014 1800   CL 110 12/27/2020 0756   CL 106 10/11/2014 1800   CO2 23 12/27/2020 0756   CO2 27 10/11/2014 1800   BUN 24 (H) 12/27/2020 0756   BUN 15 10/11/2014 1800   CREATININE 0.89 12/27/2020 0756   CREATININE 0.89 10/11/2014 1800      Component Value Date/Time   CALCIUM 8.5 (L) 12/27/2020 0756   CALCIUM 8.8 (L) 10/11/2014 1800   ALKPHOS 90 12/27/2020 0756   ALKPHOS 61 10/11/2014 1800   AST 20 12/27/2020 0756   AST 23 10/11/2014 1800   ALT 19 12/27/2020 0756   ALT 22 10/11/2014 1800   BILITOT 0.8 12/27/2020 0756   BILITOT 0.7 05/18/2017 1048   BILITOT 0.9 10/11/2014 1800       RADIOGRAPHIC STUDIES: I have personally reviewed the radiological images as listed and agreed with the findings in the report. No results  found.   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 MARCH 25th, 2022 CT C/A/P- NED; STABLE.    # currently on Venoclax [100 mg sec to diarrhea; DI with Amio];  Labs today reviewed;- STABL  #Diarrhea-G-1 ? venatolclax- monitor for now.STABLE  #Low immunoglobulins IgG 330/recent severe respiratory infection/CLL-plan IVIG #4/4 [last April 2022]. Awaiting labs today.  # A.fib-on amiodarone [drug interaction-amiodarone] sinus rhythm-on Eliquis-STABLE.   # MIld Anemia Hb ~12; today 10 - WORSE; check iron studies/ferritin; [~ colo-7-8 years]  # Muscle pain-sec to Ventoclax-G-1-2; continue tramadol/CBD prn-stable refilled Tramdaol.   DISPOSITION: ADD iron studies/ferrtin # NO infusion today # follow up in 6 weeks MD; labs- cbc/cmp/ldh;B12/folic acid; Dr.B  Addendum: June 2022-ferritin 17 iron saturation 33% evidence of iron  deficiency.  Monitor hemoglobin for now    Orders Placed This Encounter  Procedures  . CBC with Differential (Cancer Center Only)    Standing Status:   Future    Standing Expiration Date:   04/18/2021  . Vitamin B12    Standing Status:   Future    Standing Expiration Date:   04/18/2021  . Comprehensive metabolic panel    Standing Status:   Future    Standing Expiration Date:   04/18/2021  . Folate    Standing Status:   Future    Standing Expiration Date:   04/18/2021  . Lactate dehydrogenase    Standing Status:   Future    Standing Expiration Date:   04/18/2021  . Ferritin    Standing Status:   Future    Number of Occurrences:   1    Standing Expiration Date:   12/27/2021  . Iron and TIBC    Standing Status:   Future    Number of Occurrences:   1    Standing Expiration Date:   12/27/2021   All questions were answered. The patient knows to call the clinic with any problems, questions or concerns.      Cammie Sickle, MD 01/13/2021 6:49 PM

## 2020-12-27 NOTE — Assessment & Plan Note (Addendum)
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 MARCH 25th, 2022 CT C/A/P- NED; STABLE.    # currently on Venoclax [100 mg sec to diarrhea; DI with Amio];  Labs today reviewed;- STABL  #Diarrhea-G-1 ? venatolclax- monitor for now.STABLE  #Low immunoglobulins IgG 330/recent severe respiratory infection/CLL-plan IVIG #4/4 [last April 2022]. Awaiting labs today.  # A.fib-on amiodarone [drug interaction-amiodarone] sinus rhythm-on Eliquis-STABLE.   # MIld Anemia Hb ~12; today 10 - WORSE; check iron studies/ferritin; [~ colo-7-8 years]; check B12/folic acid  # Muscle pain-sec to Ventoclax-G-1-2; continue tramadol/CBD prn-stable refilled Tramdaol.   DISPOSITION: ADD iron studies/ferrtin # NO infusion today # follow up in 6 weeks MD; labs- cbc/cmp/ldh;B12/folic acid; Dr.B  Addendum: June 2022-ferritin 17 iron saturation 33% evidence of iron deficiency.  Monitor hemoglobin for now

## 2020-12-28 ENCOUNTER — Telehealth: Payer: Self-pay | Admitting: Family Medicine

## 2020-12-28 ENCOUNTER — Ambulatory Visit: Payer: Medicare HMO | Admitting: Family Medicine

## 2020-12-28 LAB — IGD: IgD: <1.3 mg/dL (ref <14.11)

## 2020-12-28 LAB — MISC LABCORP TEST (SEND OUT): Labcorp test code: 2162

## 2020-12-28 NOTE — Telephone Encounter (Signed)
I called and spoke with the patient's wife, patient was in shower, I explained to her that the provider had an emergency and could not see him this afternoon, I asked her about the patients arm and she stated that it is much better, the swelling had gone down but it was still red and a little warm to the touch, after speaking with the provider before he left he asked me to inform the patient to finish the antibiotic prescribed and to watch the arm, if the swelling increases or the redness spreads to proceed to urgent care or the Er and she understood.  I also informed her I would call them on Monday to see how patient was doing.  Stephaniemarie Stoffel,cma

## 2020-12-28 NOTE — Telephone Encounter (Signed)
Please call the patient.  Please let him know that I have had an emergency and we will not be able to complete his visit this afternoon.  Please find out how his arm is doing.  Does he still have swelling?  Is there any spreading redness?  Has he had any fevers?  Any worsening symptoms?  Thanks.

## 2021-01-01 ENCOUNTER — Other Ambulatory Visit: Payer: Self-pay | Admitting: Oncology

## 2021-01-01 ENCOUNTER — Other Ambulatory Visit (HOSPITAL_COMMUNITY): Payer: Self-pay

## 2021-01-02 ENCOUNTER — Encounter: Payer: Self-pay | Admitting: Internal Medicine

## 2021-01-02 ENCOUNTER — Other Ambulatory Visit (HOSPITAL_COMMUNITY): Payer: Self-pay

## 2021-01-02 MED ORDER — VENETOCLAX 100 MG PO TABS
ORAL_TABLET | Freq: Every day | ORAL | 0 refills | Status: DC
  Filled 2021-01-10: qty 30, 30d supply, fill #0

## 2021-01-04 ENCOUNTER — Telehealth: Payer: Self-pay | Admitting: Internal Medicine

## 2021-01-04 NOTE — Telephone Encounter (Signed)
H/T-please inform patient that his iron studies look normal.  Would not recommend any iron.  Will continue to follow hemoglobin for now.  Follow-up as planned.  GB

## 2021-01-04 NOTE — Telephone Encounter (Signed)
10 am 01/04/21- spoke with patient. Pt informed that his iron studies were normal and he does not need any IV iron at this time. He thanked me for calling him regarding this. He was instructed to keep his next apt with Dr. Jacinto Reap in July as scheduled.

## 2021-01-09 ENCOUNTER — Other Ambulatory Visit: Payer: Self-pay | Admitting: Cardiovascular Disease

## 2021-01-10 ENCOUNTER — Other Ambulatory Visit (HOSPITAL_COMMUNITY): Payer: Self-pay

## 2021-01-13 ENCOUNTER — Encounter: Payer: Self-pay | Admitting: Internal Medicine

## 2021-01-14 ENCOUNTER — Encounter: Payer: Self-pay | Admitting: Internal Medicine

## 2021-01-14 ENCOUNTER — Telehealth: Payer: Self-pay | Admitting: Pharmacy Technician

## 2021-01-14 ENCOUNTER — Other Ambulatory Visit (HOSPITAL_COMMUNITY): Payer: Self-pay

## 2021-01-15 ENCOUNTER — Telehealth: Payer: Self-pay

## 2021-01-15 NOTE — Telephone Encounter (Signed)
-----   Message from Deboraha Sprang, MD sent at 01/15/2021  8:48 AM EDT ----- Please Inform Patient that stress test showed  evidence of ischemia, with perhaps mild worsening heart muscle function  Will reach out to Dr Tg to review the study and think with him re cath   Thanks

## 2021-01-15 NOTE — Telephone Encounter (Signed)
Spoke with pt and advised per Dr Caryl Comes stress test shows some evidence of ischemia and perhaps mild worsening heart muscle function.  Pt advised Dr Caryl Comes has reached out to Dr Rockey Situ re: next steps but has not heard back yet.  Once he and Dr Rockey Situ have the opportunity to discuss pt will be contacted.  Pt verbalizes understanding and thanked Therapist, sports for the phone call.

## 2021-01-16 NOTE — Telephone Encounter (Signed)
Patient calling to check on status  Please call to discuss

## 2021-01-17 NOTE — Telephone Encounter (Signed)
Patient states his "blood pressure is going stupid" readings were 109/48 and 108/50. He also stated he has not had leg cramps since stopping cholesterol medications and it took 2 weeks to recover. Advised that once we hear back from Dr. Rockey Situ and Dr. Caryl Comes we would be in touch with him regarding any recommendations. He verbalized understanding with no further questions at this time.

## 2021-01-18 ENCOUNTER — Other Ambulatory Visit (HOSPITAL_COMMUNITY): Payer: Self-pay

## 2021-01-18 NOTE — Telephone Encounter (Signed)
Oral Oncology Patient Advocate Encounter  Was successful in securing patient a $8,000 grant from [employer] to provide copayment coverage for [employer].  This will keep the out of pocket expense at $0.     [employer] ID: [id number]  I have spoken with the patient.   The billing information is as follows and has been shared with [place].    RxBin: [id number] PCN: [id number] Member ID: [id number] Group ID: [id number] Dates of Eligibility: 12/15/20 through 12/14/21  Fund:  [place] Patient [place] Phone [phone] Fax [phone] 01/18/2021 3:40 PM

## 2021-01-22 ENCOUNTER — Other Ambulatory Visit: Payer: Self-pay

## 2021-01-22 MED ORDER — PRALUENT 150 MG/ML ~~LOC~~ SOAJ: 150.0000 mg | SUBCUTANEOUS | 12 refills | Status: DC

## 2021-01-22 MED ORDER — METOPROLOL TARTRATE 50 MG PO TABS: 50.0000 mg | ORAL_TABLET | Freq: Two times a day (BID) | ORAL | 3 refills | Status: DC

## 2021-01-22 NOTE — Telephone Encounter (Signed)
The patient has an appointment scheduled with Cadence Kathlen Mody, NP on 01/24/21 to discuss a heart cath.

## 2021-01-22 NOTE — Telephone Encounter (Signed)
Dr. Rockey Situ spoke with pt earlier today via phone, c/o SOB Dr. Rockey Situ sent secure message advising  Unable to tolerate Lipitor secondary to cramping  Previously unable to tolerate Crestor, similar symptoms  Can we request Praluent 140 mg shot every 2 weeks  May need help from pharmacy  He does get coverage from the [place]   Dr. Caryl Comes also sent an encounter stating "Caryl Never with holding statins"  Praluent was sent in to pt's [place]  Pt also schedule for this Thursday 7/7 with Cadence Kathlen Mody, PA-C Dr. Rockey Situ is advising on heart cath d/t positive stress test, Cadence will review risk and benefits for verbal consent, cath with be schedule in [place] with either [place] or End as requested by Dr. Rockey Situ.   Lipitor removed from pt's med list Will discuss further at f/u appt with Cadence

## 2021-01-23 MED ORDER — METOPROLOL TARTRATE 50 MG PO TABS: 50.0000 mg | ORAL_TABLET | Freq: Two times a day (BID) | ORAL | 3 refills | Status: DC

## 2021-01-23 NOTE — Addendum Note (Signed)
Addended by: Britt Bottom on: 01/23/2021 07:49 AM   Modules accepted: Orders

## 2021-01-24 ENCOUNTER — Ambulatory Visit: Payer: Medicare HMO | Admitting: Medical

## 2021-01-24 ENCOUNTER — Encounter: Payer: Self-pay | Admitting: Medical

## 2021-01-24 ENCOUNTER — Other Ambulatory Visit: Payer: Self-pay

## 2021-01-24 DIAGNOSIS — I5032 Chronic diastolic (congestive) heart failure: Secondary | ICD-10-CM

## 2021-01-24 DIAGNOSIS — Z01810 Encounter for preprocedural cardiovascular examination: Secondary | ICD-10-CM

## 2021-01-24 DIAGNOSIS — I5020 Unspecified systolic (congestive) heart failure: Secondary | ICD-10-CM

## 2021-01-24 DIAGNOSIS — I480 Paroxysmal atrial fibrillation: Secondary | ICD-10-CM

## 2021-01-24 DIAGNOSIS — I10 Essential (primary) hypertension: Secondary | ICD-10-CM

## 2021-01-24 DIAGNOSIS — R9439 Abnormal result of other cardiovascular function study: Secondary | ICD-10-CM

## 2021-01-24 DIAGNOSIS — I495 Sick sinus syndrome: Secondary | ICD-10-CM | POA: Diagnosis not present

## 2021-01-24 DIAGNOSIS — Z950 Presence of cardiac pacemaker: Secondary | ICD-10-CM

## 2021-01-24 NOTE — Progress Notes (Signed)
Cardiology Office Note:    Date:  01/24/2021   ID:  [name], DOB [birth year], MRN [id number]  PCP:  Leone Haven, MD  [facility] Cardiologist:  Ida Rogue, MD  [facility] Electrophysiologist:  None   Referring MD: Leone Haven, MD   Chief Complaint: abnormal stress test  History of Present Illness:    [name] is a 75 y.o. male with a hx of HLD, COPD, TIA, GERD, depression, PTSD, SSS s/p PPM, OSA on CPAP, CAD s/p CABG, CLL, atrial fibrillation on Eliquis,prior tobacco use, diastolic CHF who presents for abnormal stress test.   Admitted 01/2020 with chest pain. Troponins negative, suspected MSK vs GI. 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 Ecatic abdominal aorta at [facility] for aneurysm development. Recommended repeat US in 5 years. Echo 02/04/20 showed EF 55-60%, no WMA, mild LVH, G1DD, normal RV size, mildly elevated PASP with RV systolic pressure 96.7RFFM, mild MR, mild to mod aortic valve sclerosis without stenosis. Recommended to increase metoprolol to 50mg  daily.   Follows with Dr. Caryl Comes for PPM and afib on Eliquis and amiodarone. He was seen in August 2017 with significant bradycardia and underwent pacing. He most recently saw Dr. Caryl Comes 12/11/20. Amiodarone was decreased to 5 days a week (1000mg  a week) for gait instability and sun sensitivity. Also reported SOB and CP and Myoview was ordered.  Last saw Dr. Rockey Situ 12/21/20. He was in SR. Still reported gait instability. Lipitor was held for a month for leg cramps.   Myoview stress test showed medium defect in the mid anterior, mid anteroseptal and apical anterior and apex location consistent with ischemia. EF mildly decreased 45-54%, overall intermediate risk. Also down-sloping ST depression in V2-V4. and he was set up for appointment to discuss cardiac catheterization.   Today, Patient reports he is not feeling too good. He is short  of breath. NO chest pain. He had a mechanical fall yesterday. He was working on something and it fell towards him and he went down. Has a bruise right upper leg. Has history of falls. No lightheadedness or dizziness. No LLE, orthopnea, or pnd. Cardiac cath procedure discussed at length.   Past Medical History:  Diagnosis Date   Anxiety    Arthritis    Atrial fibrillation ([place])    a. Dx 2013, recurred 02/2014, CHA2DS2VASc = 3 -->placed on Eliquis;  b. 02/2014 Echo: EF 50-55%, mid and apical anterior septum and mid and apical inf septum are abnl, mild to mod Ao sclerosis w/o AS.   Cancer associated pain    Chicken pox    Chronic lymphocytic leukemia ([place])    a. Dx 02/2014.   CLL (chronic lymphocytic leukemia) (HCC)    Complication of anesthesia    History of  PTSD--do not touch patient when waking up from surgery.   COPD (chronic obstructive pulmonary disease) (HCC)    Coronary artery disease    a. 04/2009 CABG x 3 (LIMA->LAD, VG->OM1, VG->PDA);  b. 09/2009 Cath: occluded VG x 2 w/ patent LIMA and L->R collats. EF 55%, mild antlat HK;  c. 10/2011 MV: EF 53%, no isch/infarct-->low risk.   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   TIA (transient ischemic attack) 11/02/2015    Past Surgical History:  Procedure Laterality Date   ABDOMINAL HERNIA REPAIR     APPENDECTOMY  06/21/1985   CARDIAC CATHETERIZATION  2010; 2011   ; Dr Fletcher Anon   CORONARY ARTERY BYPASS GRAFT  04/2009   "CABG X3"   EP IMPLANTABLE DEVICE N/A 03/03/2016   Procedure: Pacemaker  Implant;  Surgeon: Deboraha Sprang, MD;  Location: [facility];  Service: Cardiovascular;  Laterality: N/A;   FOREIGN BODY REMOVAL  1968   "shrapnel in my tailbone"   INGUINAL HERNIA REPAIR Right    INSERT / REPLACE / REMOVE PACEMAKER     JOINT REPLACEMENT Right 2018   LAPAROSCOPIC CHOLECYSTECTOMY     TONSILLECTOMY AND ADENOIDECTOMY  1956   TOTAL HIP ARTHROPLASTY Right 10/22/2016   Procedure: TOTAL HIP ARTHROPLASTY;  Surgeon: Dereck Leep, MD;  Location: [facility];  Service: Orthopedics;  Laterality: Right;   TOTAL HIP ARTHROPLASTY Left 11/04/2017   Procedure: TOTAL HIP ARTHROPLASTY;  Surgeon: Dereck Leep, MD;  Location: [facility];  Service: Orthopedics;  Laterality: Left;    Current Medications: Current Meds  Medication Sig   acetaminophen (TYLENOL) 500 MG tablet Take 1,000 mg by mouth every 8 (eight) hours as needed for mild pain.   acyclovir (ZOVIRAX) 400 MG tablet Take 1 tablet (400 mg total) by mouth 2 (two) times daily.   albuterol (PROVENTIL HFA;VENTOLIN HFA) 108 (90 Base) MCG/ACT inhaler Inhale 2 puffs into the lungs every 6 (six) hours as needed for wheezing or shortness of breath.   Alirocumab (PRALUENT) 150 MG/ML SOAJ Inject 150 mg into the skin every 14 (fourteen) days.   amiodarone (PACERONE) 200 MG tablet TAKE 1/2 TABLET BY MOUTH 5 DAYS PER WEEK   apixaban (ELIQUIS) 5 MG TABS tablet Take 5 mg by mouth 2 (two) times daily.   azelastine (ASTELIN) 0.1 % nasal spray Place 2 sprays into both nostrils 2 (two) times daily. Use in each nostril as directed   budesonide-formoterol (SYMBICORT) 160-4.5 MCG/ACT inhaler Inhale 2 puffs into the lungs 2 (two) times daily.   cetirizine (ZYRTEC) 10 MG tablet Take 10 mg by mouth daily as needed for allergies.    Coenzyme Q10 (COQ10) 200 MG CAPS Take 200 mg by mouth daily.   ezetimibe (ZETIA) 10 MG tablet Take 1 tablet (10 mg total) by mouth daily.   fluticasone-salmeterol (ADVAIR) 100-50 MCG/ACT AEPB INHALE 1 PUFF TWO TIMES A DAY  **REPLACES SYMBICORT INHALER**   furosemide (LASIX) 20 MG tablet Take 1 tablet (20 mg) by mouth twice daily as needed    Krill Oil 350 MG CAPS Take 350 mg by mouth as needed.    metoprolol tartrate (LOPRESSOR) 50 MG tablet Take 1 tablet (50 mg total) by mouth 2 (two) times daily.   mirtazapine (REMERON) 15 MG tablet Take 15 mg by mouth at bedtime as needed (for panic associated with PTSD).    Multiple Vitamin (MULTIVITAMIN WITH MINERALS) TABS tablet Take 1 tablet by mouth daily.   nitroGLYCERIN (NITROSTAT) 0.4 MG SL tablet Place 1 tablet (0.4 mg total) under the tongue every 5 (five) minutes as needed for chest pain.   Tiotropium Bromide Monohydrate 2.5 MCG/ACT AERS INHALE 2 INHALATIONS BY  ORAL INHALATION EVERY DAY   traMADol (ULTRAM) 50 MG tablet Take 1 tablet (50 mg total) by mouth every 12 (twelve) hours as needed.   venetoclax (VENCLEXTA) 100 MG tablet TAKE 1 TABLET BY MOUTH ONCE DAILY     Allergies:   Patient has no known allergies.   Social History   Socioeconomic History   Marital status: Married    Spouse name: Not on file   Number of children: Not on file   Years of education: Not on file   Highest education level: Not on file  Occupational History   Occupation: retired    Fish farm manager: [employer]    Comment: Education officer, [employer]  Tobacco Use   Smoking status: Former    Packs/day: 1.00    Years: 40.00    Pack years: 40.00    Types: Cigarettes    Quit date: 07/21/2006    Years since quitting: 14.5   Smokeless tobacco: Never  Vaping Use   Vaping Use: Former  Substance and Sexual Activity   Alcohol use: Not Currently   Drug use: No   Sexual activity: Not Currently  Other Topics Concern   Not on file  Social History Narrative   Lives in [place] with wife. Dogs. Goats. Children - 4. Grandchildren - 7.      Worked - Sports coach, part time.   Diet - healthy   Exercise - very active      Service - [employer] [place]   Social Determinants of Health    Financial Resource Strain: Low Risk    Difficulty of Paying Living Expenses: Not hard at all  Food Insecurity: No Food Insecurity   Worried About Charity fundraiser in the Last Year: Never true   Arboriculturist in the Last Year: Never true  Transportation Needs: No Transportation Needs   Lack of Transportation (Medical): No   Lack of Transportation (Non-Medical): No  Physical Activity: Not on file  Stress: No Stress Concern Present   Feeling of Stress : Not at all  Social Connections: Unknown   Frequency of Communication with Friends and Family: More than three times a week   Frequency of Social Gatherings with Friends and Family: Not on file   Attends Religious Services: Not on file   Active Member of Clubs or Organizations: Not on file   Attends Archivist Meetings: Not on file   Marital Status: Married     Family History: The patient's family history includes Coronary artery disease in an other family member; Heart attack in his mother; Heart disease in his mother.  ROS:   Please see the history of present illness.     All other systems reviewed and are negative.  EKGs/Labs/Other Studies Reviewed:    The following studies were reviewed today:  Myoview Lexiscan 12/2020 Narrative & Impression  Downsloping ST segment depression ST segment depression was noted during stress in the V2, V3, V1 and V4 leads. ST depression on baseline EKG worsened with Lexiscan. Defect 1: There is a medium defect of moderate severity present in the mid anterior, mid anteroseptal, apical anterior and apex location. Findings consistent with anterior/anteroseptal ischemia. This is an intermediate risk study. The left ventricular ejection fraction is mildly decreased (45-54%). CT attenuation images shows significant aortic and coronary calcifications as well as RV pacemaker lead.    Echo 01/2020 1. Left ventricular ejection fraction, by estimation, is 55 to 60%. The  left ventricle has  normal function. The left  ventricle has no regional  wall motion abnormalities. There is mild left ventricular hypertrophy.  Left ventricular diastolic parameters  are consistent with Grade I diastolic dysfunction (impaired relaxation).   2. Right ventricular systolic function is normal. The right ventricular  size is normal. There is mildly elevated pulmonary artery systolic  pressure. The estimated right ventricular systolic pressure is 28.4 mmHg.   3. The mitral valve is normal in structure. Mild mitral valve  regurgitation.   4. Mild to moderate aortic valve sclerosis/calcification is present,  without any evidence of aortic stenosis.   EKG:  EKG is ordered today.  The ekg ordered today demonstrates Apaced rhyhtm, 61bpm, ST depression with TWI in V1-V5.    Recent Labs: 03/14/2020: B Natriuretic Peptide 93.9 08/16/2020: TSH 3.125 12/27/2020: ALT 19; BUN 24; Creatinine, Ser 0.89; Hemoglobin 10.8; Platelets 125; Potassium 3.2; Sodium 143  Recent Lipid Panel    Component Value Date/Time   CHOL 111 02/04/2020 0451   CHOL 113 05/18/2017 1048   CHOL 125 03/08/2014 0451   TRIG 95 02/04/2020 0451   TRIG 119 03/08/2014 0451   TRIG 95 08/01/2009 0000   HDL 36 (L) 02/04/2020 0451   HDL 47 05/18/2017 1048   HDL 31 (L) 03/08/2014 0451   CHOLHDL 3.1 02/04/2020 0451   VLDL 19 02/04/2020 0451   VLDL 24 03/08/2014 0451   LDLCALC 56 02/04/2020 0451   LDLCALC 53 05/18/2017 1048   LDLCALC 70 03/08/2014 0451    Physical Exam:    VS:  Ht 5\' 8"  (1.727 m)   Wt 226 lb 12 oz (102.9 kg)   BMI 34.48 kg/m     Wt Readings from Last 3 Encounters:  01/24/21 226 lb 12 oz (102.9 kg)  12/27/20 227 lb 12.8 oz (103.3 kg)  12/25/20 223 lb 6.4 oz (101.3 kg)     GEN:  Well nourished, well developed in no acute distress HEENT: Normal NECK: No JVD; No carotid bruits LYMPHATICS: No lymphadenopathy CARDIAC: RRR, no murmurs, rubs, gallops RESPIRATORY:  Clear to auscultation without rales, wheezing or  rhonchi  ABDOMEN: Soft, non-tender, non-distended MUSCULOSKELETAL:  No edema; No deformity  SKIN: Warm and dry NEUROLOGIC:  Alert and oriented x 3 PSYCHIATRIC:  Normal affect   ASSESSMENT:    1. Pre-procedural cardiovascular examination   2. Paroxysmal atrial fibrillation (HCC)   3. Sinus node dysfunction (HCC)   4. Abnormal stress test   5. Essential hypertension   6. Cardiac pacemaker in situ   7. Chronic diastolic CHF (congestive heart failure) (HCC)    PLAN:    In order of problems listed above:  Abnormal stress test CAD s/p CABG with UA Patient reported chest pain and SOB and Myovew lexiscan was ordered. This came back abnormal with medium defect in the mid anterior, mid anteroseptal and apical anterior and apex location consistent with ischemia. EF mildly decreased 45-54%, overall intermediate risk. Today patient reports continued SOB, no chest pain today. EKG shows down-sloping ST changes with TWI in anterior leads. Plan for R/L heart cath, Dr. Rockey Situ already discussed over the phone. I will also order an echo, however might be done post-procedure. Continue BB, he is to start Praluent. No ASA with Eliquis.  Risks and benefits of cardiac catheterization have been discussed with the patient.  These include bleeding, infection, kidney damage, stroke, heart attack, death.  The patient understands these risks and is willing to proceed.  Paroxysmal Afib He is is SR. Continue Eliquis and amiodarone. He will have to hold  Eliquis prior to procedure. He follows with EP, saw Dr. Caryl Comes 5/24 who decreased amiodarone to only 5 days a week.  Dyslipidemia Lipitor held for leg cramps. Plan to start Praluent. Continue Zetia. HE will need updated lipid profile.   HTN BP good today, Continue Lopressor 50mg  BID.   Chronic diastolic CHF Euvolemic on exam. Myoview Lexiscan showed mildly reduced EF, 45-54%. A year ago EF was 55-60%, g!DD. I will re-order an echo. Continue lasix 20mg  daily.   SSS  s/p PPM Followed by EP. Most recent device interrogation showed normal functioning device.   Disposition: Follow-up post-cath  Shared Decision Making/Informed Consent   Shared Decision Making/Informed Consent The risks [stroke (1 in 1000), death (1 in 1000), kidney failure [usually temporary] (1 in 500), bleeding (1 in 200), allergic reaction [possibly serious] (1 in 200)], benefits (diagnostic support and management of coronary artery disease) and alternatives of a cardiac catheterization were discussed in detail with [name] and he is willing to proceed.    Signed, Lakayla Barrington Ninfa Meeker, PA-C  01/24/2021 4:07 PM    [facility]

## 2021-01-24 NOTE — Patient Instructions (Addendum)
Medication Instructions:  No changes, please continue your current medications   If you need a refill on your cardiac medications before your next appointment, please call your pharmacy.   Lab Work: CBC & BMP (pre-procedure) LABS WILL APPEAR ON MYCHART, ABNORMAL RESULTS WILL BE CALLED  Testing/Procedures: Heart Catheterization   Follow-Up: At [facility], you and your health needs are our priority.  As part of our continuing mission to provide you with exceptional heart care, we have created designated Provider Care Teams.  These Care Teams include your primary Cardiologist (physician) and Advanced Practice Providers (APPs -  Physician Assistants and Nurse Practitioners) who all work together to provide you with the care you need, when you need it.   Your next appointment:   After heart cath  The format for your next appointment:   In Person  Provider:   Ida Rogue, MD or Cadence Jorene Minors   Other Instructions [facility] [address] [place], [place] [address] [phone] Please arrive at the [facility] main   You are scheduled for a Cardiac Cath on: Monday, 02/04/2021 Please arrive at 05:30 am on the day of your procedure Your start time is 07:30 am  Please expect a call from our [place] to pre-register you  Do not eat/drink anything after midnight Someone will need to drive you home It is recommended someone be with you for the first 24 hours after your procedure Wear clothes that are easy to get on/off and wear slip on shoes if possible  Please bring a current list of all medications with you  _X__ You may take all of your medications the morning of your procedure with enough water to swallow safely Be sure to take aspirin 81 mg the morning of the heart cath  _X__ DO NOT take these medications before your procedure: Eliquis Hold for 2 days prior to your schedule heart  cath  Day of your procedure:  Please arrive at the [facility] main. Free valet service is available.  Please check-in at the registration desk to receive your armband. After receiving your armband someone will escort you to the cardiac cath/special procedures waiting area.  The usual length of stay after your procedure is about 2 to 3 hours.  This can vary.  If you have any questions, please call our office at [phone], or you may call the cardiac cath lab at [facility] directly at [phone]  Your physician has requested that you have a cardiac catheterization. Cardiac catheterization is used to diagnose and/or treat various heart conditions. Doctors may recommend this procedure for a number of different reasons. The most common reason is to evaluate chest pain. Chest pain can be a symptom of coronary artery disease (CAD), and cardiac catheterization can show whether plaque is narrowing or blocking your heart's arteries. This procedure is also used to evaluate the valves, as well as measure the blood flow and oxygen levels in different parts of your heart. For further information please visit [email or web address]. Please follow instruction sheet, as given.

## 2021-01-24 NOTE — H&P (View-Only) (Signed)
Cardiology Office Note:    Date:  01/24/2021   ID:  [name], DOB 26-Oct-1945, MRN [id number]  PCP:  Leone Haven, MD  [facility] Cardiologist:  Ida Rogue, MD  [facility] Electrophysiologist:  None   Referring MD: Leone Haven, MD   Chief Complaint: abnormal stress test  History of Present Illness:    [name] is a 75 y.o. male with a hx of HLD, COPD, TIA, GERD, depression, PTSD, SSS s/p PPM, OSA on CPAP, CAD s/p CABG, CLL, atrial fibrillation on Eliquis,prior tobacco use, diastolic CHF who presents for abnormal stress test.   Admitted 01/2020 with chest pain. Troponins negative, suspected MSK vs GI. 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 Ecatic abdominal aorta at [facility] for aneurysm development. Recommended repeat US in 5 years. Echo 02/04/20 showed EF 55-60%, no WMA, mild LVH, G1DD, normal RV size, mildly elevated PASP with RV systolic pressure 01.7PZWC, mild MR, mild to mod aortic valve sclerosis without stenosis. Recommended to increase metoprolol to 50mg  daily.   Follows with Dr. Caryl Comes for PPM and afib on Eliquis and amiodarone. He was seen in August 2017 with significant bradycardia and underwent pacing. He most recently saw Dr. Caryl Comes 12/11/20. Amiodarone was decreased to 5 days a week (1000mg  a week) for gait instability and sun sensitivity. Also reported SOB and CP and Myoview was ordered.  Last saw Dr. Rockey Situ 12/21/20. He was in SR. Still reported gait instability. Lipitor was held for a month for leg cramps.   Myoview stress test showed medium defect in the mid anterior, mid anteroseptal and apical anterior and apex location consistent with ischemia. EF mildly decreased 45-54%, overall intermediate risk. Also down-sloping ST depression in V2-V4. and he was set up for appointment to discuss cardiac catheterization.   Today, Patient reports he is not feeling too good. He is short  of breath. NO chest pain. He had a mechanical fall yesterday. He was working on something and it fell towards him and he went down. Has a bruise right upper leg. Has history of falls. No lightheadedness or dizziness. No LLE, orthopnea, or pnd. Cardiac cath procedure discussed at length.   Past Medical History:  Diagnosis Date   Anxiety    Arthritis    Atrial fibrillation ([place])    a. Dx 2013, recurred 02/2014, CHA2DS2VASc = 3 -->placed on Eliquis;  b. 02/2014 Echo: EF 50-55%, mid and apical anterior septum and mid and apical inf septum are abnl, mild to mod Ao sclerosis w/o AS.   Cancer associated pain    Chicken pox    Chronic lymphocytic leukemia ([place])    a. Dx 02/2014.   CLL (chronic lymphocytic leukemia) (HCC)    Complication of anesthesia    History of  PTSD--do not touch patient when waking up from surgery.   COPD (chronic obstructive pulmonary disease) (HCC)    Coronary artery disease    a. 04/2009 CABG x 3 (LIMA->LAD, VG->OM1, VG->PDA);  b. 09/2009 Cath: occluded VG x 2 w/ patent LIMA and L->R collats. EF 55%, mild antlat HK;  c. 10/2011 MV: EF 53%, no isch/infarct-->low risk.   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   TIA (transient ischemic attack) 11/02/2015    Past Surgical History:  Procedure Laterality Date   ABDOMINAL HERNIA REPAIR     APPENDECTOMY  06/21/1985   CARDIAC CATHETERIZATION  2010; 2011   ; Dr Fletcher Anon   CORONARY ARTERY BYPASS GRAFT  04/2009   "CABG X3"   EP IMPLANTABLE DEVICE N/A 03/03/2016   Procedure: Pacemaker  Implant;  Surgeon: Deboraha Sprang, MD;  Location: [facility];  Service: Cardiovascular;  Laterality: N/A;   FOREIGN BODY REMOVAL  1968   "shrapnel in my tailbone"   INGUINAL HERNIA REPAIR Right    INSERT / REPLACE / REMOVE PACEMAKER     JOINT REPLACEMENT Right 2018   LAPAROSCOPIC CHOLECYSTECTOMY     TONSILLECTOMY AND ADENOIDECTOMY  1956   TOTAL HIP ARTHROPLASTY Right 10/22/2016   Procedure: TOTAL HIP ARTHROPLASTY;  Surgeon: Dereck Leep, MD;  Location: [facility];  Service: Orthopedics;  Laterality: Right;   TOTAL HIP ARTHROPLASTY Left 11/04/2017   Procedure: TOTAL HIP ARTHROPLASTY;  Surgeon: Dereck Leep, MD;  Location: [facility];  Service: Orthopedics;  Laterality: Left;    Current Medications: Current Meds  Medication Sig   acetaminophen (TYLENOL) 500 MG tablet Take 1,000 mg by mouth every 8 (eight) hours as needed for mild pain.   acyclovir (ZOVIRAX) 400 MG tablet Take 1 tablet (400 mg total) by mouth 2 (two) times daily.   albuterol (PROVENTIL HFA;VENTOLIN HFA) 108 (90 Base) MCG/ACT inhaler Inhale 2 puffs into the lungs every 6 (six) hours as needed for wheezing or shortness of breath.   Alirocumab (PRALUENT) 150 MG/ML SOAJ Inject 150 mg into the skin every 14 (fourteen) days.   amiodarone (PACERONE) 200 MG tablet TAKE 1/2 TABLET BY MOUTH 5 DAYS PER WEEK   apixaban (ELIQUIS) 5 MG TABS tablet Take 5 mg by mouth 2 (two) times daily.   azelastine (ASTELIN) 0.1 % nasal spray Place 2 sprays into both nostrils 2 (two) times daily. Use in each nostril as directed   budesonide-formoterol (SYMBICORT) 160-4.5 MCG/ACT inhaler Inhale 2 puffs into the lungs 2 (two) times daily.   cetirizine (ZYRTEC) 10 MG tablet Take 10 mg by mouth daily as needed for allergies.    Coenzyme Q10 (COQ10) 200 MG CAPS Take 200 mg by mouth daily.   ezetimibe (ZETIA) 10 MG tablet Take 1 tablet (10 mg total) by mouth daily.   fluticasone-salmeterol (ADVAIR) 100-50 MCG/ACT AEPB INHALE 1 PUFF TWO TIMES A DAY  **REPLACES SYMBICORT INHALER**   furosemide (LASIX) 20 MG tablet Take 1 tablet (20 mg) by mouth twice daily as needed    Krill Oil 350 MG CAPS Take 350 mg by mouth as needed.    metoprolol tartrate (LOPRESSOR) 50 MG tablet Take 1 tablet (50 mg total) by mouth 2 (two) times daily.   mirtazapine (REMERON) 15 MG tablet Take 15 mg by mouth at bedtime as needed (for panic associated with PTSD).    Multiple Vitamin (MULTIVITAMIN WITH MINERALS) TABS tablet Take 1 tablet by mouth daily.   nitroGLYCERIN (NITROSTAT) 0.4 MG SL tablet Place 1 tablet (0.4 mg total) under the tongue every 5 (five) minutes as needed for chest pain.   Tiotropium Bromide Monohydrate 2.5 MCG/ACT AERS INHALE 2 INHALATIONS BY  ORAL INHALATION EVERY DAY   traMADol (ULTRAM) 50 MG tablet Take 1 tablet (50 mg total) by mouth every 12 (twelve) hours as needed.   venetoclax (VENCLEXTA) 100 MG tablet TAKE 1 TABLET BY MOUTH ONCE DAILY     Allergies:   Patient has no known allergies.   Social History   Socioeconomic History   Marital status: Married    Spouse name: Not on file   Number of children: Not on file   Years of education: Not on file   Highest education level: Not on file  Occupational History   Occupation: retired    Fish farm manager: [employer]    Comment: Education officer, [employer]  Tobacco Use   Smoking status: Former    Packs/day: 1.00    Years: 40.00    Pack years: 40.00    Types: Cigarettes    Quit date: 07/21/2006    Years since quitting: 14.5   Smokeless tobacco: Never  Vaping Use   Vaping Use: Former  Substance and Sexual Activity   Alcohol use: Not Currently   Drug use: No   Sexual activity: Not Currently  Other Topics Concern   Not on file  Social History Narrative   Lives in [place] with wife. Dogs. Goats. Children - 4. Grandchildren - 7.      Worked - Sports coach, part time.   Diet - healthy   Exercise - very active      Service - [employer] [place]   Social Determinants of Health    Financial Resource Strain: Low Risk    Difficulty of Paying Living Expenses: Not hard at all  Food Insecurity: No Food Insecurity   Worried About Charity fundraiser in the Last Year: Never true   Arboriculturist in the Last Year: Never true  Transportation Needs: No Transportation Needs   Lack of Transportation (Medical): No   Lack of Transportation (Non-Medical): No  Physical Activity: Not on file  Stress: No Stress Concern Present   Feeling of Stress : Not at all  Social Connections: Unknown   Frequency of Communication with Friends and Family: More than three times a week   Frequency of Social Gatherings with Friends and Family: Not on file   Attends Religious Services: Not on file   Active Member of Clubs or Organizations: Not on file   Attends Archivist Meetings: Not on file   Marital Status: Married     Family History: The patient's family history includes Coronary artery disease in an other family member; Heart attack in his mother; Heart disease in his mother.  ROS:   Please see the history of present illness.     All other systems reviewed and are negative.  EKGs/Labs/Other Studies Reviewed:    The following studies were reviewed today:  Myoview Lexiscan 12/2020 Narrative & Impression  Downsloping ST segment depression ST segment depression was noted during stress in the V2, V3, V1 and V4 leads. ST depression on baseline EKG worsened with Lexiscan. Defect 1: There is a medium defect of moderate severity present in the mid anterior, mid anteroseptal, apical anterior and apex location. Findings consistent with anterior/anteroseptal ischemia. This is an intermediate risk study. The left ventricular ejection fraction is mildly decreased (45-54%). CT attenuation images shows significant aortic and coronary calcifications as well as RV pacemaker lead.    Echo 01/2020 1. Left ventricular ejection fraction, by estimation, is 55 to 60%. The  left ventricle has  normal function. The left  ventricle has no regional  wall motion abnormalities. There is mild left ventricular hypertrophy.  Left ventricular diastolic parameters  are consistent with Grade I diastolic dysfunction (impaired relaxation).   2. Right ventricular systolic function is normal. The right ventricular  size is normal. There is mildly elevated pulmonary artery systolic  pressure. The estimated right ventricular systolic pressure is 66.4 mmHg.   3. The mitral valve is normal in structure. Mild mitral valve  regurgitation.   4. Mild to moderate aortic valve sclerosis/calcification is present,  without any evidence of aortic stenosis.   EKG:  EKG is ordered today.  The ekg ordered today demonstrates Apaced rhyhtm, 61bpm, ST depression with TWI in V1-V5.    Recent Labs: 03/14/2020: B Natriuretic Peptide 93.9 08/16/2020: TSH 3.125 12/27/2020: ALT 19; BUN 24; Creatinine, Ser 0.89; Hemoglobin 10.8; Platelets 125; Potassium 3.2; Sodium 143  Recent Lipid Panel    Component Value Date/Time   CHOL 111 02/04/2020 0451   CHOL 113 05/18/2017 1048   CHOL 125 03/08/2014 0451   TRIG 95 02/04/2020 0451   TRIG 119 03/08/2014 0451   TRIG 95 08/01/2009 0000   HDL 36 (L) 02/04/2020 0451   HDL 47 05/18/2017 1048   HDL 31 (L) 03/08/2014 0451   CHOLHDL 3.1 02/04/2020 0451   VLDL 19 02/04/2020 0451   VLDL 24 03/08/2014 0451   LDLCALC 56 02/04/2020 0451   LDLCALC 53 05/18/2017 1048   LDLCALC 70 03/08/2014 0451    Physical Exam:    VS:  Ht 5\' 8"  (1.727 m)   Wt 226 lb 12 oz (102.9 kg)   BMI 34.48 kg/m     Wt Readings from Last 3 Encounters:  01/24/21 226 lb 12 oz (102.9 kg)  12/27/20 227 lb 12.8 oz (103.3 kg)  12/25/20 223 lb 6.4 oz (101.3 kg)     GEN:  Well nourished, well developed in no acute distress HEENT: Normal NECK: No JVD; No carotid bruits LYMPHATICS: No lymphadenopathy CARDIAC: RRR, no murmurs, rubs, gallops RESPIRATORY:  Clear to auscultation without rales, wheezing or  rhonchi  ABDOMEN: Soft, non-tender, non-distended MUSCULOSKELETAL:  No edema; No deformity  SKIN: Warm and dry NEUROLOGIC:  Alert and oriented x 3 PSYCHIATRIC:  Normal affect   ASSESSMENT:    1. Pre-procedural cardiovascular examination   2. Paroxysmal atrial fibrillation (HCC)   3. Sinus node dysfunction (HCC)   4. Abnormal stress test   5. Essential hypertension   6. Cardiac pacemaker in situ   7. Chronic diastolic CHF (congestive heart failure) (HCC)    PLAN:    In order of problems listed above:  Abnormal stress test CAD s/p CABG with UA Patient reported chest pain and SOB and Myovew lexiscan was ordered. This came back abnormal with medium defect in the mid anterior, mid anteroseptal and apical anterior and apex location consistent with ischemia. EF mildly decreased 45-54%, overall intermediate risk. Today patient reports continued SOB, no chest pain today. EKG shows down-sloping ST changes with TWI in anterior leads. Plan for R/L heart cath, Dr. Rockey Situ already discussed over the phone. I will also order an echo, however might be done post-procedure. Continue BB, he is to start Praluent. No ASA with Eliquis.  Risks and benefits of cardiac catheterization have been discussed with the patient.  These include bleeding, infection, kidney damage, stroke, heart attack, death.  The patient understands these risks and is willing to proceed.  Paroxysmal Afib He is is SR. Continue Eliquis and amiodarone. He will have to hold  Eliquis prior to procedure. He follows with EP, saw Dr. Caryl Comes 5/24 who decreased amiodarone to only 5 days a week.  Dyslipidemia Lipitor held for leg cramps. Plan to start Praluent. Continue Zetia. HE will need updated lipid profile.   HTN BP good today, Continue Lopressor 50mg  BID.   Chronic diastolic CHF Euvolemic on exam. Myoview Lexiscan showed mildly reduced EF, 45-54%. A year ago EF was 55-60%, g!DD. I will re-order an echo. Continue lasix 20mg  daily.   SSS  s/p PPM Followed by EP. Most recent device interrogation showed normal functioning device.   Disposition: Follow-up post-cath  Shared Decision Making/Informed Consent   Shared Decision Making/Informed Consent The risks [stroke (1 in 1000), death (1 in 1000), kidney failure [usually temporary] (1 in 500), bleeding (1 in 200), allergic reaction [possibly serious] (1 in 200)], benefits (diagnostic support and management of coronary artery disease) and alternatives of a cardiac catheterization were discussed in detail with Mr. Nanna and he is willing to proceed.    Signed, Illyana Schorsch Ninfa Meeker, PA-C  01/24/2021 4:07 PM    [facility]

## 2021-01-25 ENCOUNTER — Encounter: Payer: Self-pay | Admitting: Internal Medicine

## 2021-01-25 ENCOUNTER — Telehealth: Payer: Self-pay | Admitting: *Deleted

## 2021-01-25 DIAGNOSIS — I480 Paroxysmal atrial fibrillation: Secondary | ICD-10-CM

## 2021-01-25 DIAGNOSIS — I495 Sick sinus syndrome: Secondary | ICD-10-CM

## 2021-01-25 DIAGNOSIS — I5020 Unspecified systolic (congestive) heart failure: Secondary | ICD-10-CM

## 2021-01-25 DIAGNOSIS — R9439 Abnormal result of other cardiovascular function study: Secondary | ICD-10-CM

## 2021-01-25 DIAGNOSIS — I5032 Chronic diastolic (congestive) heart failure: Secondary | ICD-10-CM

## 2021-01-25 LAB — BASIC METABOLIC PANEL
BUN/Creatinine Ratio: 24 (ref 10–24)
BUN: 21 mg/dL (ref 8–27)
CO2: 19 mmol/L — ABNORMAL LOW (ref 20–29)
Calcium: 8.6 mg/dL (ref 8.6–10.2)
Chloride: 110 mmol/L — ABNORMAL HIGH (ref 96–106)
Creatinine, Ser: 0.88 mg/dL (ref 0.76–1.27)
Glucose: 111 mg/dL — ABNORMAL HIGH (ref 65–99)
Potassium: 3.6 mmol/L (ref 3.5–5.2)
Sodium: 144 mmol/L (ref 134–144)
eGFR: 90 mL/min/{1.73_m2} (ref >59)

## 2021-01-25 LAB — CBC
Hematocrit: 33.1 % — ABNORMAL LOW (ref 37.5–51.0)
Hemoglobin: 11.2 g/dL — ABNORMAL LOW (ref 13.0–17.7)
MCH: 34.5 pg — ABNORMAL HIGH (ref 26.6–33.0)
MCHC: 33.8 g/dL (ref 31.5–35.7)
MCV: 102 fL — ABNORMAL HIGH (ref 79–97)
Platelets: 138 10*3/uL — ABNORMAL LOW (ref 150–450)
RBC: 3.25 x10E6/uL — ABNORMAL LOW (ref 4.14–5.80)
RDW: 13.5 % (ref 11.6–15.4)
WBC: 5.7 10*3/uL (ref 3.4–10.8)

## 2021-01-25 LAB — SPECIMEN STATUS REPORT

## 2021-01-25 NOTE — Telephone Encounter (Signed)
Verbal discussion with Cadence Jorene Minors that patient will need echocardiogram. Will send to scheduling to set this up for patient.

## 2021-01-25 NOTE — Telephone Encounter (Signed)
-----   Message from Valora Corporal, RN sent at 01/25/2021 11:41 AM EDT ----- Regarding: FW: please advise Patient is scheduled for heart cath on 7/18. Did you still want an Echo?   ----- Message ----- From: Elissa Hefty Sent: 01/25/2021  11:27 AM EDT To: [facility] Triage Subject: please advise                                  Please advise if this patient needs an echo.

## 2021-01-25 NOTE — Addendum Note (Signed)
Addended by: Anselm Pancoast on: 01/25/2021 01:18 PM   Modules accepted: Orders

## 2021-01-28 ENCOUNTER — Other Ambulatory Visit: Payer: Self-pay | Admitting: Internal Medicine

## 2021-01-28 DIAGNOSIS — C9110 Chronic lymphocytic leukemia of B-cell type not having achieved remission: Secondary | ICD-10-CM

## 2021-01-31 ENCOUNTER — Telehealth: Payer: Self-pay | Admitting: *Deleted

## 2021-01-31 ENCOUNTER — Other Ambulatory Visit (HOSPITAL_COMMUNITY): Payer: Self-pay

## 2021-01-31 ENCOUNTER — Other Ambulatory Visit: Payer: Self-pay | Admitting: Internal Medicine

## 2021-01-31 NOTE — Telephone Encounter (Signed)
Dr. Jacinto Reap - Please consider refill. Also looks like patient will be having a cardiac cath on 7/18

## 2021-01-31 NOTE — Telephone Encounter (Signed)
Pt contacted pre-catheterization scheduled at [facility] for: Monday February 04, 2021 7:30 AM Verified arrival time and place: [place] [facility]) at: 5:30 AM   No solid food after midnight prior to cath, clear liquids until 5 AM day of procedure.  Hold: Eliquis-none 02/02/21 until post procedure Lasix-AM of procedure  Except hold medications AM meds can be  taken pre-cath with sips of water including: aspirin 81 mg   Confirmed patient has responsible adult to drive home post procedure and be with patient first 24 hours after arriving home: yes  You are allowed ONE visitor in the waiting room during the time you are at the hospital for your procedure. Both you and your visitor must wear a mask once you enter the hospital.   Patient reports does not currently have any symptoms concerning for COVID-19 and no household members with COVID-19 like illness.      Reviewed procedure/mask/visitor instructions with patient.

## 2021-02-01 ENCOUNTER — Other Ambulatory Visit (HOSPITAL_COMMUNITY): Payer: Self-pay

## 2021-02-01 MED ORDER — VENETOCLAX 100 MG PO TABS
ORAL_TABLET | Freq: Every day | ORAL | 0 refills | Status: DC
  Filled 2021-02-01: qty 30, 30d supply, fill #0

## 2021-02-01 NOTE — Telephone Encounter (Signed)
Patient returning automated call .  He is unsure if related to procedure .  Patient aware call back will be noted.

## 2021-02-04 ENCOUNTER — Encounter (HOSPITAL_COMMUNITY): Payer: Self-pay | Admitting: Internal Medicine

## 2021-02-04 ENCOUNTER — Other Ambulatory Visit: Payer: Self-pay

## 2021-02-04 ENCOUNTER — Encounter (HOSPITAL_COMMUNITY)
Admission: RE | Disposition: A | Discharge status: Home / Self Care | Payer: Self-pay | Source: Home / Self Care | Attending: Internal Medicine

## 2021-02-04 ENCOUNTER — Ambulatory Visit (HOSPITAL_COMMUNITY)
Admission: RE | Admit: 2021-02-04 | Discharge: 2021-02-04 | Disposition: A | Discharge status: Home / Self Care | Payer: Medicare HMO | Attending: Internal Medicine | Admitting: Internal Medicine

## 2021-02-04 DIAGNOSIS — I25119 Atherosclerotic heart disease of native coronary artery with unspecified angina pectoris: Secondary | ICD-10-CM | POA: Insufficient documentation

## 2021-02-04 DIAGNOSIS — I495 Sick sinus syndrome: Secondary | ICD-10-CM | POA: Diagnosis not present

## 2021-02-04 DIAGNOSIS — I25110 Atherosclerotic heart disease of native coronary artery with unstable angina pectoris: Secondary | ICD-10-CM | POA: Diagnosis not present

## 2021-02-04 DIAGNOSIS — Z7951 Long term (current) use of inhaled steroids: Secondary | ICD-10-CM | POA: Insufficient documentation

## 2021-02-04 DIAGNOSIS — Z7901 Long term (current) use of anticoagulants: Secondary | ICD-10-CM | POA: Insufficient documentation

## 2021-02-04 DIAGNOSIS — I200 Unstable angina: Secondary | ICD-10-CM | POA: Diagnosis present

## 2021-02-04 DIAGNOSIS — J449 Chronic obstructive pulmonary disease, unspecified: Secondary | ICD-10-CM | POA: Insufficient documentation

## 2021-02-04 DIAGNOSIS — Z79899 Other long term (current) drug therapy: Secondary | ICD-10-CM | POA: Insufficient documentation

## 2021-02-04 DIAGNOSIS — I5032 Chronic diastolic (congestive) heart failure: Secondary | ICD-10-CM | POA: Diagnosis not present

## 2021-02-04 DIAGNOSIS — Z01810 Encounter for preprocedural cardiovascular examination: Secondary | ICD-10-CM

## 2021-02-04 DIAGNOSIS — Z8673 Personal history of transient ischemic attack (TIA), and cerebral infarction without residual deficits: Secondary | ICD-10-CM | POA: Insufficient documentation

## 2021-02-04 DIAGNOSIS — Z9221 Personal history of antineoplastic chemotherapy: Secondary | ICD-10-CM | POA: Insufficient documentation

## 2021-02-04 DIAGNOSIS — I25710 Atherosclerosis of autologous vein coronary artery bypass graft(s) with unstable angina pectoris: Secondary | ICD-10-CM

## 2021-02-04 DIAGNOSIS — Z8249 Family history of ischemic heart disease and other diseases of the circulatory system: Secondary | ICD-10-CM | POA: Diagnosis not present

## 2021-02-04 DIAGNOSIS — G4733 Obstructive sleep apnea (adult) (pediatric): Secondary | ICD-10-CM | POA: Insufficient documentation

## 2021-02-04 DIAGNOSIS — Z96643 Presence of artificial hip joint, bilateral: Secondary | ICD-10-CM | POA: Diagnosis not present

## 2021-02-04 DIAGNOSIS — I429 Cardiomyopathy, unspecified: Secondary | ICD-10-CM

## 2021-02-04 DIAGNOSIS — R0602 Shortness of breath: Secondary | ICD-10-CM | POA: Insufficient documentation

## 2021-02-04 DIAGNOSIS — I2582 Chronic total occlusion of coronary artery: Secondary | ICD-10-CM | POA: Diagnosis not present

## 2021-02-04 DIAGNOSIS — E7800 Pure hypercholesterolemia, unspecified: Secondary | ICD-10-CM | POA: Diagnosis not present

## 2021-02-04 DIAGNOSIS — Z9049 Acquired absence of other specified parts of digestive tract: Secondary | ICD-10-CM | POA: Insufficient documentation

## 2021-02-04 DIAGNOSIS — Z8616 Personal history of COVID-19: Secondary | ICD-10-CM | POA: Diagnosis not present

## 2021-02-04 DIAGNOSIS — I110 Hypertensive heart disease with heart failure: Secondary | ICD-10-CM | POA: Insufficient documentation

## 2021-02-04 DIAGNOSIS — I25810 Atherosclerosis of coronary artery bypass graft(s) without angina pectoris: Secondary | ICD-10-CM | POA: Insufficient documentation

## 2021-02-04 DIAGNOSIS — Z856 Personal history of leukemia: Secondary | ICD-10-CM | POA: Diagnosis not present

## 2021-02-04 DIAGNOSIS — Z950 Presence of cardiac pacemaker: Secondary | ICD-10-CM | POA: Diagnosis not present

## 2021-02-04 DIAGNOSIS — I480 Paroxysmal atrial fibrillation: Secondary | ICD-10-CM | POA: Diagnosis not present

## 2021-02-04 DIAGNOSIS — R9439 Abnormal result of other cardiovascular function study: Secondary | ICD-10-CM

## 2021-02-04 HISTORY — PX: RIGHT/LEFT HEART CATH AND CORONARY/GRAFT ANGIOGRAPHY: CATH118267

## 2021-02-04 LAB — POCT I-STAT 7, (LYTES, BLD GAS, ICA,H+H)
Acid-Base Excess: 2 mmol/L (ref 0.0–2.0)
Bicarbonate: 26.7 mmol/L (ref 20.0–28.0)
Calcium, Ion: 1.21 mmol/L (ref 1.15–1.40)
HCT: 29 % — ABNORMAL LOW (ref 39.0–52.0)
Hemoglobin: 9.9 g/dL — ABNORMAL LOW (ref 13.0–17.0)
O2 Saturation: 99 %
Potassium: 3.7 mmol/L (ref 3.5–5.1)
Sodium: 144 mmol/L (ref 135–145)
TCO2: 28 mmol/L (ref 22–32)
pCO2 arterial: 42.8 mmHg (ref 32.0–48.0)
pH, Arterial: 7.403 (ref 7.350–7.450)
pO2, Arterial: 121 mmHg — ABNORMAL HIGH (ref 83.0–108.0)

## 2021-02-04 LAB — POCT I-STAT EG7
Acid-Base Excess: 2 mmol/L (ref 0.0–2.0)
Bicarbonate: 28.1 mmol/L — ABNORMAL HIGH (ref 20.0–28.0)
Calcium, Ion: 1.2 mmol/L (ref 1.15–1.40)
HCT: 28 % — ABNORMAL LOW (ref 39.0–52.0)
Hemoglobin: 9.5 g/dL — ABNORMAL LOW (ref 13.0–17.0)
O2 Saturation: 70 %
Potassium: 3.6 mmol/L (ref 3.5–5.1)
Sodium: 144 mmol/L (ref 135–145)
TCO2: 30 mmol/L (ref 22–32)
pCO2, Ven: 47.8 mmHg (ref 44.0–60.0)
pH, Ven: 7.377 (ref 7.250–7.430)
pO2, Ven: 38 mmHg (ref 32.0–45.0)

## 2021-02-04 SURGERY — RIGHT/LEFT HEART CATH AND CORONARY/GRAFT ANGIOGRAPHY
Anesthesia: LOCAL

## 2021-02-04 MED ORDER — ASPIRIN 81 MG PO CHEW
81.0000 mg | CHEWABLE_TABLET | ORAL | Status: DC
Start: 2021-02-04 — End: 2021-02-04

## 2021-02-04 MED ORDER — HEPARIN (PORCINE) IN NACL 1000-0.9 UT/500ML-% IV SOLN
INTRAVENOUS | Status: AC
  Filled 2021-02-04: qty 1000

## 2021-02-04 MED ORDER — SODIUM CHLORIDE 0.9 % WEIGHT BASED INFUSION
3.0000 mL/kg/h | INTRAVENOUS | Status: DC
  Administered 2021-02-04: 3 mL/kg/h via INTRAVENOUS

## 2021-02-04 MED ORDER — MIDAZOLAM HCL 2 MG/2ML IJ SOLN
INTRAMUSCULAR | Status: AC
  Filled 2021-02-04: qty 2

## 2021-02-04 MED ORDER — ASPIRIN 81 MG PO CHEW: 81.0000 mg | CHEWABLE_TABLET | ORAL | Status: DC

## 2021-02-04 MED ORDER — FENTANYL CITRATE (PF) 100 MCG/2ML IJ SOLN
INTRAMUSCULAR | Status: DC | PRN
  Administered 2021-02-04: 25 ug via INTRAVENOUS

## 2021-02-04 MED ORDER — HEPARIN SODIUM (PORCINE) 1000 UNIT/ML IJ SOLN
INTRAMUSCULAR | Status: DC | PRN
  Administered 2021-02-04: 5000 [IU] via INTRAVENOUS

## 2021-02-04 MED ORDER — FENTANYL CITRATE (PF) 100 MCG/2ML IJ SOLN
INTRAMUSCULAR | Status: AC
  Filled 2021-02-04: qty 2

## 2021-02-04 MED ORDER — LIDOCAINE HCL (PF) 1 % IJ SOLN
INTRAMUSCULAR | Status: DC | PRN
  Administered 2021-02-04 (×3): 2 mL

## 2021-02-04 MED ORDER — SODIUM CHLORIDE 0.9% FLUSH: 3.0000 mL | Freq: Two times a day (BID) | INTRAVENOUS | Status: DC

## 2021-02-04 MED ORDER — HYDRALAZINE HCL 20 MG/ML IJ SOLN: 10.0000 mg | INTRAMUSCULAR | Status: DC | PRN

## 2021-02-04 MED ORDER — LABETALOL HCL 5 MG/ML IV SOLN: 10.0000 mg | INTRAVENOUS | Status: DC | PRN

## 2021-02-04 MED ORDER — LIDOCAINE HCL (PF) 1 % IJ SOLN
INTRAMUSCULAR | Status: AC
  Filled 2021-02-04: qty 30

## 2021-02-04 MED ORDER — IOHEXOL 350 MG/ML SOLN
INTRAVENOUS | Status: DC | PRN
  Administered 2021-02-04: 80 mL

## 2021-02-04 MED ORDER — FUROSEMIDE 20 MG PO TABS: 40.0000 mg | ORAL_TABLET | Freq: Every day | ORAL | Status: DC

## 2021-02-04 MED ORDER — MIDAZOLAM HCL 2 MG/2ML IJ SOLN
INTRAMUSCULAR | Status: DC | PRN
  Administered 2021-02-04: 1 mg via INTRAVENOUS

## 2021-02-04 MED ORDER — HYDRALAZINE HCL 20 MG/ML IJ SOLN
INTRAMUSCULAR | Status: AC
  Filled 2021-02-04: qty 1

## 2021-02-04 MED ORDER — SODIUM CHLORIDE 0.9 % IV SOLN: 250.0000 mL | INTRAVENOUS | Status: DC | PRN

## 2021-02-04 MED ORDER — ACETAMINOPHEN 325 MG PO TABS: 650.0000 mg | ORAL_TABLET | ORAL | Status: DC | PRN

## 2021-02-04 MED ORDER — ISOSORBIDE MONONITRATE ER 30 MG PO TB24: 30.0000 mg | ORAL_TABLET | Freq: Every day | ORAL | 11 refills | Status: DC

## 2021-02-04 MED ORDER — VERAPAMIL HCL 2.5 MG/ML IV SOLN
INTRAVENOUS | Status: DC | PRN
  Administered 2021-02-04: 10 mL via INTRA_ARTERIAL

## 2021-02-04 MED ORDER — VERAPAMIL HCL 2.5 MG/ML IV SOLN
INTRAVENOUS | Status: AC
  Filled 2021-02-04: qty 2

## 2021-02-04 MED ORDER — SODIUM CHLORIDE 0.9 % WEIGHT BASED INFUSION: 1.0000 mL/kg/h | INTRAVENOUS | Status: DC

## 2021-02-04 MED ORDER — HEPARIN (PORCINE) IN NACL 1000-0.9 UT/500ML-% IV SOLN
INTRAVENOUS | Status: DC | PRN
  Administered 2021-02-04 (×2): 500 mL

## 2021-02-04 MED ORDER — SODIUM CHLORIDE 0.9% FLUSH: 3.0000 mL | INTRAVENOUS | Status: DC | PRN

## 2021-02-04 MED ORDER — HEPARIN SODIUM (PORCINE) 1000 UNIT/ML IJ SOLN
INTRAMUSCULAR | Status: AC
  Filled 2021-02-04: qty 1

## 2021-02-04 MED ORDER — ONDANSETRON HCL 4 MG/2ML IJ SOLN: 4.0000 mg | Freq: Four times a day (QID) | INTRAMUSCULAR | Status: DC | PRN

## 2021-02-04 MED ORDER — HYDRALAZINE HCL 20 MG/ML IJ SOLN
INTRAMUSCULAR | Status: DC | PRN
  Administered 2021-02-04: 10 mg via INTRAVENOUS

## 2021-02-04 SURGICAL SUPPLY — 13 items
CATH BALLN WEDGE 5F 110CM (CATHETERS) ×2 IMPLANT
CATH INFINITI 5 FR IM (CATHETERS) ×2 IMPLANT
CATH INFINITI 5FR MULTPACK ANG (CATHETERS) ×2 IMPLANT
DEVICE RAD COMP TR BAND LRG (VASCULAR PRODUCTS) ×2 IMPLANT
GLIDESHEATH SLEND SS 6F .021 (SHEATH) ×4 IMPLANT
GUIDEWIRE .025 260CM (WIRE) ×2 IMPLANT
GUIDEWIRE INQWIRE 1.5J.035X260 (WIRE) ×1 IMPLANT
INQWIRE 1.5J .035X260CM (WIRE) ×2
KIT HEART LEFT (KITS) ×2 IMPLANT
PACK CARDIAC CATHETERIZATION (CUSTOM PROCEDURE TRAY) ×2 IMPLANT
SHEATH GLIDE SLENDER 4/5FR (SHEATH) ×2 IMPLANT
TRANSDUCER W/STOPCOCK (MISCELLANEOUS) ×2 IMPLANT
TUBING CIL FLEX 10 FLL-RA (TUBING) ×2 IMPLANT

## 2021-02-04 NOTE — Interval H&P Note (Signed)
History and Physical Interval Note:  02/04/2021 7:17 AM  [name]  has presented today for surgery, with the diagnosis of accelerating angina, cardiomyopathy, and abnormal stress test.  The various methods of treatment have been discussed with the patient and family. After consideration of risks, benefits and other options for treatment, the patient has consented to  Procedure(s): RIGHT/LEFT HEART CATH AND CORONARY/GRAFT ANGIOGRAPHY (N/A) as a surgical intervention.  The patient's history has been reviewed, patient examined, no change in status, stable for surgery.  I have reviewed the patient's chart and labs.  Questions were answered to the patient's satisfaction.    Cath Lab Visit (complete for each Cath Lab visit)  Clinical Evaluation Leading to the Procedure:   ACS: No.  Non-ACS:    Anginal Classification: CCS III  Anti-ischemic medical therapy: Minimal Therapy (1 class of medications)  Non-Invasive Test Results: Intermediate-risk stress test findings: cardiac mortality 1-3%/year  Prior CABG: Previous CABG  Kenna Kirn

## 2021-02-05 ENCOUNTER — Telehealth: Payer: Self-pay | Admitting: Cardiovascular Disease

## 2021-02-05 ENCOUNTER — Other Ambulatory Visit: Payer: Self-pay

## 2021-02-05 NOTE — Telephone Encounter (Signed)
Looks Dr. Saunders Revel was filling this medication is PCP willing to fill medication?

## 2021-02-05 NOTE — Telephone Encounter (Signed)
Dr End sent this in for him yesterday. There is no need for me to fill this at this time.

## 2021-02-05 NOTE — Telephone Encounter (Signed)
[name] called  in and wanted to verify his med change from yesterday post-cath by Dr. Saunders Revel.  IMDUR 30 mg Daily Lasix 40 mg Daily  [name] wanted to let Dr. Rockey Situ his heart cath went good, no stents at this time, feels better now knowing things went "ok".   Echo 8/2 F/u office visit 8/22

## 2021-02-05 NOTE — Telephone Encounter (Signed)
Patient is calling regarding Isosorbide and Lasix. States DR. End has changed these medications and would like to discuss this.

## 2021-02-07 ENCOUNTER — Inpatient Hospital Stay: Payer: Medicare HMO | Attending: Internal Medicine

## 2021-02-07 ENCOUNTER — Other Ambulatory Visit (HOSPITAL_COMMUNITY): Payer: Self-pay

## 2021-02-07 ENCOUNTER — Inpatient Hospital Stay (HOSPITAL_BASED_OUTPATIENT_CLINIC_OR_DEPARTMENT_OTHER): Payer: Medicare HMO | Admitting: Internal Medicine

## 2021-02-07 ENCOUNTER — Other Ambulatory Visit: Payer: Self-pay

## 2021-02-07 DIAGNOSIS — C9110 Chronic lymphocytic leukemia of B-cell type not having achieved remission: Secondary | ICD-10-CM | POA: Insufficient documentation

## 2021-02-07 DIAGNOSIS — R197 Diarrhea, unspecified: Secondary | ICD-10-CM | POA: Diagnosis not present

## 2021-02-07 DIAGNOSIS — Z7901 Long term (current) use of anticoagulants: Secondary | ICD-10-CM | POA: Insufficient documentation

## 2021-02-07 DIAGNOSIS — Z87891 Personal history of nicotine dependence: Secondary | ICD-10-CM | POA: Diagnosis not present

## 2021-02-07 DIAGNOSIS — I4891 Unspecified atrial fibrillation: Secondary | ICD-10-CM | POA: Diagnosis not present

## 2021-02-07 DIAGNOSIS — M791 Myalgia, unspecified site: Secondary | ICD-10-CM | POA: Insufficient documentation

## 2021-02-07 DIAGNOSIS — D649 Anemia, unspecified: Secondary | ICD-10-CM | POA: Diagnosis not present

## 2021-02-07 DIAGNOSIS — Z79899 Other long term (current) drug therapy: Secondary | ICD-10-CM | POA: Diagnosis not present

## 2021-02-07 LAB — COMPREHENSIVE METABOLIC PANEL
ALT: 23 U/L (ref 0–44)
AST: 19 U/L (ref 15–41)
Albumin: 4.1 g/dL (ref 3.5–5.0)
Alkaline Phosphatase: 109 U/L (ref 38–126)
Anion gap: 8 (ref 5–15)
BUN: 23 mg/dL (ref 8–23)
CO2: 27 mmol/L (ref 22–32)
Calcium: 8.6 mg/dL — ABNORMAL LOW (ref 8.9–10.3)
Chloride: 106 mmol/L (ref 98–111)
Creatinine, Ser: 1.12 mg/dL (ref 0.61–1.24)
GFR, Estimated: >60 mL/min (ref >60)
Glucose, Bld: 124 mg/dL — ABNORMAL HIGH (ref 70–99)
Potassium: 3.7 mmol/L (ref 3.5–5.1)
Sodium: 141 mmol/L (ref 135–145)
Total Bilirubin: 0.8 mg/dL (ref 0.3–1.2)
Total Protein: 6.6 g/dL (ref 6.5–8.1)

## 2021-02-07 LAB — CBC WITH DIFFERENTIAL/PLATELET
Abs Immature Granulocytes: 0.04 10*3/uL (ref 0.00–0.07)
Basophils Absolute: 0 10*3/uL (ref 0.0–0.1)
Basophils Relative: 1 %
Eosinophils Absolute: 0.1 10*3/uL (ref 0.0–0.5)
Eosinophils Relative: 1 %
HCT: 34 % — ABNORMAL LOW (ref 39.0–52.0)
Hemoglobin: 11.4 g/dL — ABNORMAL LOW (ref 13.0–17.0)
Immature Granulocytes: 1 %
Lymphocytes Relative: 28 %
Lymphs Abs: 2 10*3/uL (ref 0.7–4.0)
MCH: 34.9 pg — ABNORMAL HIGH (ref 26.0–34.0)
MCHC: 33.5 g/dL (ref 30.0–36.0)
MCV: 104 fL — ABNORMAL HIGH (ref 80.0–100.0)
Monocytes Absolute: 1.1 10*3/uL — ABNORMAL HIGH (ref 0.1–1.0)
Monocytes Relative: 15 %
Neutro Abs: 3.9 10*3/uL (ref 1.7–7.7)
Neutrophils Relative %: 54 %
Platelets: 144 10*3/uL — ABNORMAL LOW (ref 150–400)
RBC: 3.27 MIL/uL — ABNORMAL LOW (ref 4.22–5.81)
RDW: 14.4 % (ref 11.5–15.5)
WBC: 7.1 10*3/uL (ref 4.0–10.5)
nRBC: 0 % (ref 0.0–0.2)

## 2021-02-07 LAB — VITAMIN B12: Vitamin B-12: 739 pg/mL (ref 180–914)

## 2021-02-07 LAB — FOLATE: Folate: 11.5 ng/mL (ref >5.9)

## 2021-02-07 LAB — LACTATE DEHYDROGENASE: LDH: 155 U/L (ref 98–192)

## 2021-02-07 NOTE — Assessment & Plan Note (Addendum)
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 MARCH 25th, 2022 CT C/A/P- NED; STABLE.    # currently on Venoclax [100 mg sec to diarrhea; DI with Amio];  Labs today reviewed;-stable.  #Diarrhea-G-1 ? venatolclax- monitor for now.STABLE  #Low immunoglobulins IgG 330/recent severe respiratory infection/CLL-s/pI IVIG #4/4 [last April 2022]. STABLE.   # A.fib-on amiodarone [drug interaction-amiodarone] sinus rhythm-on Eliquis-STABLE.   # MIld Anemia Hb ~11-12; June 2022-ferritin 17 iron saturation 33%   # Muscle pain-sec to Ventoclax-G-1-2; continue tramadol/CBD prn-STABLE; on prn Tramdaol.   DISPOSITION: # NO infusion today # follow up in 6 weeks MD; labs- cbc/cmp/ldh;B12/folic acid; Dr.B

## 2021-02-07 NOTE — Progress Notes (Signed)
[place] OFFICE PROGRESS NOTE  Patient Care Team: Leone Haven, MD as PCP - General (Family Medicine) Rockey Situ Kathlene November, MD as PCP - Cardiology (Cardiology) [name], MD as Consulting Physician (Cardiology) Cammie Sickle, MD as Medical Oncologist (Hematology and Oncology)  Cancer Staging No matching staging information was found for the patient.   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  MARCH 2017- CT N/C/A/P- NED. STOP Ritux; surveillance   # AUG 2019- CT/PET- progression/NO transformation;  # NOV 2019- Progression; started Ibrutinib 420 mg/d. STOPPED in end of feb sec to extreme fatigue/joint pains/cramps  #November 01, 2018-start ibrutinib 280 mg a day; December 20, 2018-discontinue ibrutinib secondary multiple side effects.   #January 03, 2019 start [place] [July]; finished Dec 2020-; started [facility] maintenance [200 mg a day; DI-amiodarone]  #Mid March 2021-venetoclax dose reduced to 100 mg [second diarrhea].   #? 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; IGVH- UN-MUTATED [poor prognosis]  SURVIVORSHIP: p  DIAGNOSIS: CLL   STAGE: IV  ;GOALS: control  CURRENT/MOST RECENT THERAPY : venetoclax     CLL (chronic lymphocytic leukemia) ([place])  01/03/2019 -  Chemotherapy   The patient had obinutuzumab (GAZYVA) 100 mg in sodium chloride 0.9 % 100 mL (0.9615 mg/mL) chemo infusion, 100 mg, Intravenous, Once, 6 of 6 cycles Administration: 100 mg (01/03/2019), 900 mg (01/04/2019), 1,000 mg (01/10/2019),  1,000 mg (01/31/2019), 1,000 mg (01/17/2019), 1,000 mg (02/28/2019), 1,000 mg (04/07/2019), 1,000 mg (05/05/2019), 1,000 mg (06/02/2019)   for chemotherapy treatment.       INTERVAL HISTORY:  [name] 75 y.o.  male CLL-high risk currently on venetoclax is here for follow-up/ s/p IVIG x4 for low immunoglobulins.  Chronic mild joint pains.  Mild loose stools 1-2.  Denies any nausea vomiting.  Denies any headaches.  No weight loss.  No night sweats.  \Review of Systems  Constitutional:  Positive for malaise/fatigue. Negative for chills, diaphoresis and fever.  HENT:  Negative for nosebleeds and sore throat.   Eyes:  Negative for double vision.  Respiratory:  Negative for hemoptysis, shortness of breath and wheezing.   Cardiovascular:  Negative for chest pain, palpitations, orthopnea and leg swelling.  Gastrointestinal:  Positive for diarrhea. Negative for abdominal pain, blood in stool, constipation, heartburn, melena, nausea and vomiting.  Genitourinary:  Negative for dysuria, frequency and urgency.  Musculoskeletal:  Positive for back pain, joint pain and myalgias.  Skin: Negative.  Negative for itching and rash.  Neurological:  Negative for dizziness, tingling, focal weakness and headaches.  Psychiatric/Behavioral:  Negative for depression. The patient is not nervous/anxious and does not have insomnia.      PAST MEDICAL HISTORY :  Past Medical History:  Diagnosis Date  . Anxiety   . Arthritis   . Atrial fibrillation ([place])    a. Dx 2013, recurred 02/2014, CHA2DS2VASc = 3 -->placed on Eliquis;  b. 02/2014 Echo: EF 50-55%, mid and apical anterior septum and mid and apical inf septum are abnl, mild to mod Ao sclerosis w/o AS.  Marland Kitchen Cancer associated pain   .  Chicken pox   . Chronic lymphocytic leukemia ([place])    a. Dx 02/2014.  Marland Kitchen CLL (chronic lymphocytic leukemia) ([place])   . Complication of anesthesia    History of  PTSD--do not touch patient when waking up from surgery.  Marland Kitchen COPD  (chronic obstructive pulmonary disease) ([place])   . Coronary artery disease    a. 04/2009 CABG x 3 (LIMA->LAD, VG->OM1, VG->PDA);  b. 09/2009 Cath: occluded VG x 2 w/ patent LIMA and L->R collats. EF 55%, mild antlat HK;  c. 10/2011 MV: EF 53%, no isch/infarct-->low risk.  Marland Kitchen Dysrhythmia    hx of a-fib  . GERD (gastroesophageal reflux disease)    occasional  . History of chemotherapy 2015-2016  . HOH (hard of hearing)    Bilateral Hearing Aids  . Hypertension   . Myocardial infarction ([place]) 2010  . OSA on CPAP    USE C-PAP  . Presence of permanent cardiac pacemaker 2017  . PTSD (post-traumatic stress disorder)   . PTSD (post-traumatic stress disorder)   . Pure hypercholesterolemia   . Rheumatic fever 1959  . 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 TIA (transient ischemic attack) 11/02/2015    PAST SURGICAL HISTORY :   Past Surgical History:  Procedure Laterality Date  . ABDOMINAL HERNIA REPAIR    . APPENDECTOMY  06/21/1985  . CARDIAC CATHETERIZATION  2010; 2011   ; Dr Fletcher Anon  . CORONARY ARTERY BYPASS GRAFT  04/2009   "CABG X3"  . EP IMPLANTABLE DEVICE N/A 03/03/2016   Procedure: Pacemaker Implant;  Surgeon: Deboraha Sprang, MD;  Location: [place] [facility];  Service: Cardiovascular;  Laterality: N/A;  . FOREIGN BODY REMOVAL  1968   "shrapnel in my tailbone"  . INGUINAL HERNIA REPAIR Right   . INSERT / REPLACE / REMOVE PACEMAKER    . JOINT REPLACEMENT Right 2018  . LAPAROSCOPIC CHOLECYSTECTOMY    . RIGHT/LEFT HEART CATH AND CORONARY/GRAFT ANGIOGRAPHY N/A 02/04/2021   Procedure: RIGHT/LEFT HEART CATH AND CORONARY/GRAFT ANGIOGRAPHY;  Surgeon: Nelva Bush, MD;  Location: [place];  Service: Cardiovascular;  Laterality: N/A;  . TONSILLECTOMY AND ADENOIDECTOMY  1956  . TOTAL HIP ARTHROPLASTY Right 10/22/2016   Procedure: TOTAL HIP ARTHROPLASTY;  Surgeon: Dereck Leep, MD;  Location: [facility];  Service: Orthopedics;  Laterality: Right;  . TOTAL HIP  ARTHROPLASTY Left 11/04/2017   Procedure: TOTAL HIP ARTHROPLASTY;  Surgeon: Dereck Leep, MD;  Location: [facility];  Service: Orthopedics;  Laterality: Left;    FAMILY HISTORY :   Family History  Problem Relation Age of Onset  . Heart disease Mother   . Heart attack Mother   . Coronary artery disease Other        family history    SOCIAL HISTORY:   Social History   Tobacco Use  . Smoking status: Former    Packs/day: 1.00    Years: 40.00    Pack years: 40.00    Types: Cigarettes    Quit date: 07/21/2006    Years since quitting: 14.5  . Smokeless tobacco: Never  Vaping Use  . Vaping Use: Former  Substance Use Topics  . Alcohol use: Not Currently  . Drug use: No    ALLERGIES:  has No Known Allergies.  MEDICATIONS:  Current Outpatient Medications  Medication Sig Dispense Refill  . acetaminophen (TYLENOL) 500 MG tablet Take 1,000 mg by mouth every 8 (eight) hours as needed for mild pain.    Marland Kitchen acyclovir (ZOVIRAX) 400  MG tablet TAKE 1 TABLET(400 MG) BY MOUTH TWICE DAILY (Patient taking differently: Take 400 mg by mouth 2 (two) times daily.) 60 tablet 6  . albuterol (PROVENTIL HFA;VENTOLIN HFA) 108 (90 Base) MCG/ACT inhaler Inhale 2 puffs into the lungs every 6 (six) hours as needed for wheezing or shortness of breath. (Patient taking differently: Inhale 2 puffs into the lungs 2 (two) times daily.) 1 Inhaler 11  . Alirocumab (PRALUENT) 150 MG/ML SOAJ Inject 150 mg into the skin every 14 (fourteen) days. 2 mL 12  . amiodarone (PACERONE) 200 MG tablet TAKE 1/2 TABLET BY MOUTH 5 DAYS PER WEEK (Patient taking differently: Take 100 mg by mouth See admin instructions. Take 100 mg every day except Wednesday and Sunday) 33 tablet 0  . apixaban (ELIQUIS) 5 MG TABS tablet Take 5 mg by mouth 2 (two) times daily.    Marland Kitchen azelastine (ASTELIN) 0.1 % nasal spray Place 2 sprays into both nostrils 2 (two) times daily. Use in each nostril as directed 30 mL 3  . budesonide-formoterol (SYMBICORT) 160-4.5  MCG/ACT inhaler Inhale 2 puffs into the lungs 2 (two) times daily. 1 Inhaler 11  . cetirizine (ZYRTEC) 10 MG tablet Take 10 mg by mouth daily as needed for allergies.     . Coenzyme Q10 (COQ10) 200 MG CAPS Take 200 mg by mouth daily.    Marland Kitchen ezetimibe (ZETIA) 10 MG tablet Take 1 tablet (10 mg total) by mouth daily. 90 tablet 3  . fluticasone-salmeterol (ADVAIR) 100-50 MCG/ACT AEPB Inhale 1 puff into the lungs 2 (two) times daily.    . furosemide (LASIX) 20 MG tablet Take 2 tablets (40 mg total) by mouth daily. 30 tablet   . isosorbide mononitrate (IMDUR) 30 MG 24 hr tablet Take 1 tablet (30 mg total) by mouth daily. 30 tablet 11  . Krill Oil 350 MG CAPS Take 350 mg by mouth daily as needed (Heart).    . metoprolol tartrate (LOPRESSOR) 50 MG tablet Take 1 tablet (50 mg total) by mouth 2 (two) times daily. 180 tablet 3  . mirtazapine (REMERON) 15 MG tablet Take 15 mg by mouth at bedtime as needed (for panic associated with PTSD).     . Multiple Vitamin (MULTIVITAMIN WITH MINERALS) TABS tablet Take 1 tablet by mouth daily.    . Tiotropium Bromide Monohydrate 2.5 MCG/ACT AERS Inhale 2 puffs into the lungs at bedtime. Spiriva    . traMADol (ULTRAM) 50 MG tablet Take 1 tablet (50 mg total) by mouth every 12 (twelve) hours as needed. (Patient taking differently: Take 50 mg by mouth every 12 (twelve) hours as needed for moderate pain or severe pain.) 60 tablet 0  . venetoclax (VENCLEXTA) 100 MG tablet TAKE 1 TABLET BY MOUTH ONCE DAILY 30 tablet 0  . nitroGLYCERIN (NITROSTAT) 0.4 MG SL tablet Place 1 tablet (0.4 mg total) under the tongue every 5 (five) minutes as needed for chest pain. (Patient not taking: Reported on 02/07/2021) 25 tablet 6   No current facility-administered medications for this visit.    PHYSICAL EXAMINATION: ECOG PERFORMANCE STATUS: 1 - Symptomatic but completely ambulatory  BP 112/67   Pulse 62   Temp 98.8 F (37.1 C) (Tympanic)   Resp (!) 22   Wt 225 lb (102.1 kg)   SpO2 100%  Comment: room air  BMI 34.21 kg/m   Filed Weights   02/07/21 1011  Weight: 225 lb (102.1 kg)    Physical Exam Constitutional:      Comments: Patient is alone.  HENT:     Head: Normocephalic and atraumatic.     Mouth/Throat:     Pharynx: No oropharyngeal exudate.  Eyes:     Pupils: Pupils are equal, round, and reactive to light.  Cardiovascular:     Rate and Rhythm: Normal rate and regular rhythm.  Pulmonary:     Effort: No respiratory distress.     Breath sounds: No wheezing.  Abdominal:     General: Bowel sounds are normal. There is no distension.     Palpations: Abdomen is soft. There is no mass.     Tenderness: no abdominal tenderness There is no guarding or rebound.  Musculoskeletal:        General: No tenderness. Normal range of motion.     Cervical back: Normal range of motion and neck supple.  Lymphadenopathy:     Comments: Resolved lymph nodes in the neck underarms.  Skin:    General: Skin is warm.  Neurological:     Mental Status: He is alert and oriented to person, place, and time.  Psychiatric:        Mood and Affect: Affect normal.    LABORATORY DATA:  I have reviewed the data as listed    Component Value Date/Time   NA 141 02/07/2021 0942   NA 144 01/24/2021 1627   NA 139 10/11/2014 1800   K 3.7 02/07/2021 0942   K 3.3 (L) 10/11/2014 1800   CL 106 02/07/2021 0942   CL 106 10/11/2014 1800   CO2 27 02/07/2021 0942   CO2 27 10/11/2014 1800   GLUCOSE 124 (H) 02/07/2021 0942   GLUCOSE 107 (H) 10/11/2014 1800   BUN 23 02/07/2021 0942   BUN 21 01/24/2021 1627   BUN 15 10/11/2014 1800   CREATININE 1.12 02/07/2021 0942   CREATININE 0.89 10/11/2014 1800   CALCIUM 8.6 (L) 02/07/2021 0942   CALCIUM 8.8 (L) 10/11/2014 1800   PROT 6.6 02/07/2021 0942   PROT 6.7 05/18/2017 1048   PROT 6.4 (L) 10/11/2014 1800   ALBUMIN 4.1 02/07/2021 0942   ALBUMIN 4.3 05/18/2017 1048   ALBUMIN 4.1 10/11/2014 1800   AST 19 02/07/2021 0942   AST 23 10/11/2014 1800    ALT 23 02/07/2021 0942   ALT 22 10/11/2014 1800   ALKPHOS 109 02/07/2021 0942   ALKPHOS 61 10/11/2014 1800   BILITOT 0.8 02/07/2021 0942   BILITOT 0.7 05/18/2017 1048   BILITOT 0.9 10/11/2014 1800   GFRNONAA >60 02/07/2021 0942   GFRNONAA >60 10/11/2014 1800   GFRAA >60 04/12/2020 0959   GFRAA >60 10/11/2014 1800    No results found for: SPEP, UPEP  Lab Results  Component Value Date   WBC 7.1 02/07/2021   NEUTROABS 3.9 02/07/2021   HGB 11.4 (L) 02/07/2021   HCT 34.0 (L) 02/07/2021   MCV 104.0 (H) 02/07/2021   PLT 144 (L) 02/07/2021      Chemistry      Component Value Date/Time   NA 141 02/07/2021 0942   NA 144 01/24/2021 1627   NA 139 10/11/2014 1800   K 3.7 02/07/2021 0942   K 3.3 (L) 10/11/2014 1800   CL 106 02/07/2021 0942   CL 106 10/11/2014 1800   CO2 27 02/07/2021 0942   CO2 27 10/11/2014 1800   BUN 23 02/07/2021 0942   BUN 21 01/24/2021 1627   BUN 15 10/11/2014 1800   CREATININE 1.12 02/07/2021 0942   CREATININE 0.89 10/11/2014 1800      Component Value Date/Time  CALCIUM 8.6 (L) 02/07/2021 0942   CALCIUM 8.8 (L) 10/11/2014 1800   ALKPHOS 109 02/07/2021 0942   ALKPHOS 61 10/11/2014 1800   AST 19 02/07/2021 0942   AST 23 10/11/2014 1800   ALT 23 02/07/2021 0942   ALT 22 10/11/2014 1800   BILITOT 0.8 02/07/2021 0942   BILITOT 0.7 05/18/2017 1048   BILITOT 0.9 10/11/2014 1800       RADIOGRAPHIC STUDIES: I have personally reviewed the radiological images as listed and agreed with the findings in the report. No results found.   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 MARCH 25th, 2022 CT C/A/P- NED; STABLE.    # currently on Venoclax [100 mg sec to diarrhea; DI with Amio];  Labs today reviewed;-stable.  #Diarrhea-G-1 ? venatolclax- monitor for now.STABLE  #Low  immunoglobulins IgG 330/recent severe respiratory infection/CLL-s/pI IVIG #4/4 [last April 2022]. STABLE.   # A.fib-on amiodarone [drug interaction-amiodarone] sinus rhythm-on Eliquis-STABLE.   # MIld Anemia Hb ~11-12; June 2022-ferritin 17 iron saturation 33%   # Muscle pain-sec to Ventoclax-G-1-2; continue tramadol/CBD prn-STABLE; on prn Tramdaol.   DISPOSITION: # NO infusion today # follow up in 6 weeks MD; labs- cbc/cmp/ldh;B12/folic acid; Dr.B      Orders Placed This Encounter  Procedures  . CBC with Differential/Platelet  . CBC with Differential    Standing Status:   Future    Standing Expiration Date:   02/07/2022  . Comprehensive metabolic panel    Standing Status:   Future    Standing Expiration Date:   02/07/2022  . Lactate dehydrogenase    Standing Status:   Future    Standing Expiration Date:   02/07/2022   All questions were answered. The patient knows to call the clinic with any problems, questions or concerns.      Cammie Sickle, MD 02/10/2021 5:14 PM

## 2021-02-07 NOTE — Progress Notes (Signed)
Patient underwent a cardiac cath this week.

## 2021-02-10 ENCOUNTER — Encounter: Payer: Self-pay | Admitting: Internal Medicine

## 2021-02-19 ENCOUNTER — Ambulatory Visit (INDEPENDENT_AMBULATORY_CARE_PROVIDER_SITE_OTHER): Payer: Medicare HMO

## 2021-02-19 ENCOUNTER — Other Ambulatory Visit: Payer: Self-pay

## 2021-02-19 DIAGNOSIS — I5020 Unspecified systolic (congestive) heart failure: Secondary | ICD-10-CM

## 2021-02-19 MED ORDER — FUROSEMIDE 20 MG PO TABS: 40.0000 mg | ORAL_TABLET | Freq: Every day | ORAL | 3 refills | Status: DC

## 2021-02-19 MED ORDER — PERFLUTREN LIPID MICROSPHERE
1.0000 mL | INTRAVENOUS | Status: AC | PRN
Start: 2021-02-19 — End: 2021-02-19
  Administered 2021-02-19: 3 mL via INTRAVENOUS

## 2021-02-20 LAB — ECHOCARDIOGRAM COMPLETE
AR max vel: 3.27 cm2
AV Area VTI: 3.51 cm2
AV Area mean vel: 3.47 cm2
AV Mean grad: 4 mmHg
AV Peak grad: 7.8 mmHg
Ao pk vel: 1.4 m/s
Area-P 1/2: 2.72 cm2
Calc EF: 71.4 %
MV VTI: 4.02 cm2
S' Lateral: 2.9 cm
Single Plane A2C EF: 66.7 %
Single Plane A4C EF: 74.4 %

## 2021-03-04 ENCOUNTER — Other Ambulatory Visit (HOSPITAL_COMMUNITY): Payer: Self-pay

## 2021-03-04 ENCOUNTER — Other Ambulatory Visit: Payer: Self-pay | Admitting: Internal Medicine

## 2021-03-04 MED ORDER — VENETOCLAX 100 MG PO TABS
ORAL_TABLET | Freq: Every day | ORAL | 0 refills | Status: DC
  Filled 2021-03-04: qty 30, 30d supply, fill #0

## 2021-03-05 ENCOUNTER — Other Ambulatory Visit (HOSPITAL_COMMUNITY): Payer: Self-pay

## 2021-03-06 ENCOUNTER — Other Ambulatory Visit (HOSPITAL_COMMUNITY): Payer: Self-pay

## 2021-03-09 NOTE — Progress Notes (Signed)
Cardiology Office Note:    Date:  03/12/2021   ID:  [name], DOB 1946-02-06, MRN [id number]  PCP:  Michael Haven, MD  [facility] Cardiologist:  Michael Rogue, MD  [facility] Electrophysiologist:  None   Referring MD: Michael Haven, MD   Chief Complaint: Heart cath follow-up  History of Present Illness:    [name] is a 75 y.o. male with a hx of HLD, COPD, TIA, GERD, depression, PTSD, SSS s/p PPM, OSA on CPAP, CAD s/p CABG, CLL, atrial fibrillation on Eliquis,prior tobacco use, diastolic CHF who presents for heart catheter follow-up.    Admitted 01/2020 with chest pain. Troponins negative, suspected MSK vs GI. 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 [facility]atic abdominal aorta at [facility] for aneurysm development. Recommended repeat US in 5 years. Echo 02/04/20 showed EF 55-60%, no WMA, mild LVH, G1DD, normal RV size, mildly elevated PASP with RV systolic pressure [id number], mild MR, mild to mod aortic valve sclerosis without stenosis. Recommended to increase metoprolol to '50mg'$  daily.    Follows with Dr. Caryl Doyle for PPM and afib on Eliquis and amiodarone. He was seen in August 2017 with significant bradycardia and underwent pacing. He most recently saw Dr. Caryl Doyle 12/11/20. Amiodarone was decreased to 5 days a week ('1000mg'$  a week) for gait instability and sun sensitivity. Also reported SOB and CP and Myoview was ordered.   Last saw Dr. Rockey Doyle 12/21/20. He was in SR. Still reported gait instability. Lipitor was held for a month for leg cramps.    Myoview stress test showed medium defect in the mid anterior, mid anteroseptal and apical anterior and apex location consistent with ischemia. EF mildly decreased 45-54%, overall intermediate risk. Also down-sloping ST depression in V2-V4. and he was set up for appointment to discuss cardiac catheterization.   He underwent right and left heart cath that showed  severe 2V CAD, including chronic total occlusion of the pLAD and 90-99% distal RCA/rPLA, which fill via L-R collaterals, widely patent LIMA to LAD, chronically occluded SVG-OM1 and SVG-rPDA, mild to mod LVEF 40-45%, mildly elevated pulmonary artery pressures, moderately elevated right heart pressures. Medical therapy was recommended. He was started on lasix '40mg'$  daily and Imdur '30mg'$  dialy.   Today, he reports one episode of chest discomfort last week while sitting. Felt like something was vibrating in left side of the chest. Last for hours and then went away. Has not had recurrence. He has no chest pain. He has shortness of breath with exertion, but this is unchanged. Reports labile Bps. Has been 80/40 in the evenings. Highest was 170/80. With low BPS no lightheadedness of dizziness. Takes BP before am meds. BP today borderline low. He has been using CPAP every night. No bleeding issues on Xarelto. Wondering about praluent shot, will check on this.   Past Medical History:  Diagnosis Date   Anxiety    Arthritis    Atrial fibrillation ([place])    a. Dx 2013, recurred 02/2014, CHA2DS2VASc = 3 -->placed on Eliquis;  b. 02/2014 Echo: EF 50-55%, mid and apical anterior septum and mid and apical inf septum are abnl, mild to mod Ao sclerosis w/o AS.   Cancer associated pain    Chicken pox    Chronic lymphocytic leukemia ([place])    a. Dx 02/2014.   CLL (chronic lymphocytic leukemia) (HCC)    Complication of anesthesia    History of  PTSD--do not touch patient when waking up  from surgery.   COPD (chronic obstructive pulmonary disease) (HCC)    Coronary artery disease    a. 04/2009 CABG x 3 (LIMA->LAD, VG->OM1, VG->PDA);  b. 09/2009 Cath: occluded VG x 2 w/ patent LIMA and L->R collats. EF 55%, mild antlat HK;  c. 10/2011 MV: EF 53%, no isch/infarct-->low risk.   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   TIA (transient ischemic attack) 11/02/2015    Past Surgical History:  Procedure Laterality Date   ABDOMINAL HERNIA REPAIR     APPENDECTOMY  06/21/1985   CARDIAC CATHETERIZATION  2010; 2011   ; Dr [name]   CORONARY ARTERY BYPASS GRAFT  04/2009   "CABG X3"   EP IMPLANTABLE DEVICE N/A 03/03/2016   Procedure: Pacemaker Implant;  Surgeon: Michael Sprang, MD;  Location: [place] [facility];  Service: Cardiovascular;  Laterality: N/A;   FOREIGN BODY REMOVAL  1968   "shrapnel in my tailbone"   INGUINAL HERNIA REPAIR Right    INSERT / REPLACE / [place] Right 2018   LAPAROSCOPIC CHOLECYSTECTOMY     RIGHT/LEFT HEART CATH AND CORONARY/GRAFT ANGIOGRAPHY N/A 02/04/2021   Procedure: RIGHT/LEFT HEART CATH AND CORONARY/GRAFT ANGIOGRAPHY;  Surgeon: Michael Bush, MD;  Location: [place] [facility];  Service: Cardiovascular;  Laterality: N/A;   TONSILLECTOMY AND ADENOIDECTOMY  1956   TOTAL HIP ARTHROPLASTY Right 10/22/2016   Procedure: TOTAL HIP ARTHROPLASTY;  Surgeon: Michael Leep, MD;  Location: [facility] ORS;  Service: Orthopedics;  Laterality: Right;   TOTAL HIP ARTHROPLASTY Left 11/04/2017   Procedure: TOTAL HIP ARTHROPLASTY;  Surgeon: Michael Leep, MD;  Location: [facility] OR[facility];  Service: Orthopedics;  Laterality: Left;    Current Medications: Current Meds  Medication Sig   acetaminophen (TYLENOL) 500 MG tablet Take 1,000 mg by mouth every 8 (eight) hours as needed for mild pain.   acyclovir (ZOVIRAX) 400 MG tablet TAKE 1 TABLET(400 MG) BY MOUTH TWICE DAILY   albuterol (PROVENTIL HFA;VENTOLIN HFA) 108 (90 Base) MCG/ACT inhaler Inhale 2  puffs into the lungs every 6 (six) hours as needed for wheezing or shortness of breath.   amiodarone (PACERONE) 200 MG tablet TAKE 1/2 TABLET BY MOUTH 5 DAYS PER WEEK (Patient taking differently: Take 200 mg by mouth daily.)   apixaban (ELIQUIS) 5 MG TABS tablet Take 5 mg by mouth 2 (two) times daily.   azelastine (ASTELIN) 0.1 % nasal spray Place 2 sprays into both nostrils 2 (two) times daily. Use in each nostril as directed   budesonide-formoterol (SYMBICORT) 160-4.5 MCG/ACT inhaler Inhale 2 puffs into the lungs 2 (two) times daily.   cetirizine (ZYRTEC) 10 MG tablet Take 10 mg by mouth daily as needed for allergies.    Coenzyme Q10 (COQ10) 200 MG CAPS Take 200 mg by mouth daily.   ezetimibe (ZETIA) 10 MG tablet Take 1 tablet (10 mg total) by mouth daily.   fluticasone-salmeterol (ADVAIR) 100-50 MCG/ACT AEPB Inhale 1 puff into the  lungs 2 (two) times daily.   furosemide (LASIX) 20 MG tablet Take 2 tablets (40 mg total) by mouth daily.   isosorbide mononitrate (IMDUR) 30 MG 24 hr tablet Take 1 tablet (30 mg total) by mouth daily.   Krill Oil 350 MG CAPS Take 350 mg by mouth daily as needed (Heart).   metoprolol tartrate (LOPRESSOR) 50 MG tablet Take 1 tablet (50 mg total) by mouth 2 (two) times daily.   mirtazapine (REMERON) 15 MG tablet Take 15 mg by mouth at bedtime as needed (for panic associated with PTSD).    Multiple Vitamin (MULTIVITAMIN WITH MINERALS) TABS tablet Take 1 tablet by mouth daily.   nitroGLYCERIN (NITROSTAT) 0.4 MG SL tablet Place 1 tablet (0.4 mg total) under the tongue every 5 (five) minutes as needed for chest pain.   Tiotropium Bromide Monohydrate 2.5 MCG/ACT AERS Inhale 2 puffs into the lungs at bedtime. Spiriva   traMADol (ULTRAM) 50 MG tablet Take 1 tablet (50 mg total) by mouth every 12 (twelve) hours as needed. (Patient taking differently: Take 50 mg by mouth every 12 (twelve) hours as needed for moderate pain or severe pain.)   venetoclax (VENCLEXTA) 100 MG tablet  TAKE 1 TABLET BY MOUTH ONCE DAILY     Allergies:   Patient has no known allergies.   Social History   Socioeconomic History   Marital status: Married    Spouse name: Not on file   Number of children: Not on file   Years of education: Not on file   Highest education level: Not on file  Occupational History   Occupation: retired    Fish farm manager: [employer]    Comment: Education officer, [employer]  Tobacco Use   Smoking status: Former    Packs/day: 1.00    Years: 40.00    Pack years: 40.00    Types: Cigarettes    Quit date: 07/21/2006    Years since quitting: 14.6   Smokeless tobacco: Never  Vaping Use   Vaping Use: Former  Substance and Sexual Activity   Alcohol use: Not Currently   Drug use: No   Sexual activity: Not Currently  Other Topics Concern   Not on file  Social History Narrative   Lives in [place] with wife. Dogs. Goats. Children - 4. Grandchildren - 7.      Worked - Sports coach, part time.   Diet - healthy   Exercise - very active      Service - [employer] - [place]   Social Determinants of Health   Financial Resource Strain: Low Risk    Difficulty of Paying Living Expenses: Not hard at all  Food Insecurity: No Food Insecurity   Worried About Charity fundraiser in the Last Year: Never true   Arboriculturist in the Last Year: Never true  Transportation Needs: No Transportation Needs   Lack of Transportation (Medical): No   Lack of Transportation (Non-Medical): No  Physical Activity: Not on file  Stress: No Stress Concern Present   Feeling of Stress : Not at all  Social Connections: Unknown   Frequency of Communication with Friends and Family: More than three times a week   Frequency of Social Gatherings with Friends and Family: Not on file   Attends Religious Services: Not on file   Active Member of Clubs or Organizations: Not on file   Attends Archivist Meetings: Not on file   Marital Status: Married     Family History: The  patient's family  history includes Coronary artery disease in an other family member; Heart attack in his mother; Heart disease in his mother.  ROS:   Please see the history of present illness.     All other systems reviewed and are negative.  EKGs/Labs/Other Studies Reviewed:    The following studies were reviewed today:  Cardiac cath 02/04/21 Conclusions: Severe two-vessel coronary artery disease, including chronic total occlusion of the proximal LAD and 90-99% distal RCA/rPLA disease. RPL branches and rPDA fill via left-to-right collaterals. Widely patent LIMA-LAD. Chronically occluded SVG-OM1 and SVG-rPDA. Mildly to moderately reduced left ventricular systolic function (LVEDP [id number]). Mildly elevated left heart and pulmonary artery pressures. Moderately elevated right heart filling pressures. Normal Fick cardiac output/index.   Recommendations: Optimize medical therapy.  I will add standing furosemide 40 mg PO daily and isosorbide mononitrate 30 mg daily. Continue secondary prevention of coronary artery disease. Consider repeat CBC in 1-2 weeks, given iSTAT hemoglobin 9-10 and recent fall with significant hip/thigh bruising on chronic anticoagulation. If no evidence of bleeding/vascular injury, restart apixaban tomorrow morning.   Michael Bush, MD [facility]  Echo 02/19/21 1. Left ventricular ejection fraction, by estimation, is 55 to 60%. The  left ventricle has normal function. The left ventricle has no regional  wall motion abnormalities. Left ventricular diastolic parameters are  consistent with Grade I diastolic  dysfunction (impaired relaxation).   2. Right ventricular systolic function is mildly reduced. The right  ventricular size is normal. Mildly increased right ventricular wall  thickness. Tricuspid regurgitation signal is inadequate for assessing PA  pressure.   3. The mitral valve was not well visualized. Trivial mitral valve  regurgitation. No evidence of  mitral stenosis.   4. The aortic valve has an indeterminant number of cusps. There is mild  calcification of the aortic valve. There is moderate thickening of the  aortic valve. Aortic valve regurgitation is not visualized. Mild to  moderate aortic valve  sclerosis/calcification is present, without any evidence of aortic  stenosis.   5. The inferior vena cava is dilated in size with >50% respiratory  variability, suggesting right atrial pressure of 8 mmHg.   EKG:  EKG is  ordered today.  The ekg ordered today demonstrates a paced V sensed rhythm, 63bpm, TWI anteroseptal leads, no change  Recent Labs: 03/14/2020: B Natriuretic Peptide 93.9 08/16/2020: TSH 3.125 02/07/2021: ALT 23 03/11/2021: BUN 13; Creatinine, Ser 1.06; Hemoglobin 12.5; Platelets 159; Potassium 4.1; Sodium 143  Recent Lipid Panel    Component Value Date/Time   CHOL 111 02/04/2020 0451   CHOL 113 05/18/2017 1048   CHOL 125 03/08/2014 0451   TRIG 95 02/04/2020 0451   TRIG 119 03/08/2014 0451   TRIG 95 08/01/2009 0000   HDL 36 (L) 02/04/2020 0451   HDL 47 05/18/2017 1048   HDL 31 (L) 03/08/2014 0451   CHOLHDL 3.1 02/04/2020 0451   VLDL 19 02/04/2020 0451   VLDL 24 03/08/2014 0451   LDLCALC 56 02/04/2020 0451   LDLCALC 53 05/18/2017 1048   LDLCALC 70 03/08/2014 0451      Physical Exam:    VS:  BP 100/60 (BP Location: Left Arm, Patient Position: Sitting, Cuff Size: Normal)   Pulse 63   Ht '5\' 8"'$  (1.727 m)   Wt 225 lb (102.1 kg)   BMI 34.21 kg/m     Wt Readings from Last 3 Encounters:  03/11/21 225 lb (102.1 kg)  02/07/21 225 lb (102.1 kg)  02/04/21 226 lb (102.5 kg)  GEN:  Well nourished, well developed in no acute distress HEENT: Normal NECK: No JVD; No carotid bruits LYMPHATICS: No lymphadenopathy CARDIAC: RRR, no murmurs, rubs, gallops RESPIRATORY:  Clear to auscultation without rales, wheezing or rhonchi  ABDOMEN: Soft, non-tender, non-distended MUSCULOSKELETAL:  No edema; No deformity   SKIN: Warm and dry NEUROLOGIC:  Alert and oriented x 3 PSYCHIATRIC:  Normal affect   ASSESSMENT:    1. Coronary artery disease of native artery of native heart with stable angina pectoris (HCC)   2. Paroxysmal atrial fibrillation ([place])   3. Chronic diastolic CHF (congestive heart failure) ([place])   4. Sick sinus syndrome ([place])   5. Essential hypertension   6. Cardiac pacemaker in Doyle   7. Hyperlipidemia, unspecified hyperlipidemia type    PLAN:    In order of problems listed above:  CAD s/p CABG Atypical chest pain Patient had an abnormal stress test and was set up for cardiac cath. Recent right and left heart cath that showed severe 2V CAD, including chronic total occlusion of the pLAD and 90-99% distal RCA/rPLA, which fill via L-R collaterals, widely patent LIMA to LAD, chronically occluded SVG-OM1 and SVG-rPDA, mild to mod LVEF 40-45%, mildly elevated pulmonary artery pressures, moderately elevated right heart pressures. Medical therapy was recommended with lasix and Imdur. Cath site, right radial, stable  He reported atypical chest pain episode last week. Continue Aspirin, praluent, zetia, BB. Imdur. Cannot titrate Imdur with borderline pressures.   Paroxysmal Afib Re-check CBC given Eliquis with fall and low Hgb. EKG shows A-paced, V sensed rhythm. Continue amiodarone, previously changed to 5 days a week.   Dyslipidemia LDL 56 01/2020. Praluent shot instructions given today. Continue Zetia. Can re-check at follow-up.   HTN BP borderline today 100/60. Patient reports labile pressures at home. Denies lightheadedness or dizziness. Continue Imdur 30 mg and Toprol.   HFpEF Echo showed LVEF 55-60%, G1DD, mildly reduced RV function, trivial MR, mild to mod Aovalve sclerosis/calcification. Started on lasix '40mg'$  daily due to elevated LVEDP. He is euvolemic on exam. Reports dyspnea on exertion that is unchanged. BMET today.  SSSs/p PPM Follows with EP for Pacemaker check, says he needs  to send in remote upload.   Disposition: Follow up in 3 month(s) with MD/APP    Signed, Zaylynn Rickett Ninfa Meeker, PA-C  03/12/2021 11:27 AM    [place]

## 2021-03-11 ENCOUNTER — Other Ambulatory Visit: Payer: Self-pay

## 2021-03-11 ENCOUNTER — Ambulatory Visit: Payer: Medicare HMO | Admitting: Medical

## 2021-03-11 ENCOUNTER — Encounter: Payer: Self-pay | Admitting: Medical

## 2021-03-11 VITALS — BP 100/60 | HR 63 | Ht 68.0 in | Wt 225.0 lb

## 2021-03-11 DIAGNOSIS — I25118 Atherosclerotic heart disease of native coronary artery with other forms of angina pectoris: Secondary | ICD-10-CM | POA: Diagnosis not present

## 2021-03-11 DIAGNOSIS — E785 Hyperlipidemia, unspecified: Secondary | ICD-10-CM

## 2021-03-11 DIAGNOSIS — I480 Paroxysmal atrial fibrillation: Secondary | ICD-10-CM

## 2021-03-11 DIAGNOSIS — I495 Sick sinus syndrome: Secondary | ICD-10-CM

## 2021-03-11 DIAGNOSIS — I10 Essential (primary) hypertension: Secondary | ICD-10-CM

## 2021-03-11 DIAGNOSIS — Z950 Presence of cardiac pacemaker: Secondary | ICD-10-CM

## 2021-03-11 DIAGNOSIS — I5032 Chronic diastolic (congestive) heart failure: Secondary | ICD-10-CM

## 2021-03-11 NOTE — Patient Instructions (Signed)
Medication Instructions:  No changes at this time.  *If you need a refill on your cardiac medications before your next appointment, please call your pharmacy*   Lab Work: BMET & CBC today  If you have labs (blood work) drawn today and your tests are completely normal, you will receive your results only by: [place] (if you have MyChart) OR A paper copy in the mail If you have any lab test that is abnormal or we need to change your treatment, we will call you to review the results.   Testing/Procedures: None    Follow-Up: At [facility], you and your health needs are our priority.  As part of our continuing mission to provide you with exceptional heart care, we have created designated Provider Care Teams.  These Care Teams include your primary Cardiologist (physician) and Advanced Practice Providers (APPs -  Physician Assistants and Nurse Practitioners) who all work together to provide you with the care you need, when you need it.  We recommend signing up for the patient portal called "MyChart".  Sign up information is provided on this After Visit Summary.  MyChart is used to connect with patients for Virtual Visits (Telemedicine).  Patients are able to view lab/test results, encounter notes, upcoming appointments, etc.  Non-urgent messages can be sent to your provider as well.   To learn more about what you can do with MyChart, go to [email or web address].    Your next appointment:   3 month(s)  The format for your next appointment:   In Person  Provider:   You may see Ida Rogue, MD or one of the following Advanced Practice Providers on your designated Care Team:   Murray Hodgkins, NP Christell Faith, PA-C Marrianne Mood, PA-C Cadence Kathlen Mody, [place]   Other Instructions Please monitor blood pressures and keep a log of your readings.   Make sure to check 2 hours after your medications.   AVOID these things for 30 minutes before checking your blood pressure: No  Drinking caffeine. No Drinking alcohol. No Eating. No Smoking. No Exercising.  Five minutes before checking your blood pressure: Pee. Sit in a dining chair. Avoid sitting in a soft couch or armchair. Be quiet. Do not talk.

## 2021-03-12 ENCOUNTER — Telehealth: Payer: Self-pay | Admitting: Medical

## 2021-03-12 ENCOUNTER — Encounter: Payer: Self-pay | Admitting: Internal Medicine

## 2021-03-12 LAB — CBC
Hematocrit: 36.9 % — ABNORMAL LOW (ref 37.5–51.0)
Hemoglobin: 12.5 g/dL — ABNORMAL LOW (ref 13.0–17.7)
MCH: 34 pg — ABNORMAL HIGH (ref 26.6–33.0)
MCHC: 33.9 g/dL (ref 31.5–35.7)
MCV: 100 fL — ABNORMAL HIGH (ref 79–97)
Platelets: 159 10*3/uL (ref 150–450)
RBC: 3.68 x10E6/uL — ABNORMAL LOW (ref 4.14–5.80)
RDW: 13.5 % (ref 11.6–15.4)
WBC: 6.5 10*3/uL (ref 3.4–10.8)

## 2021-03-12 LAB — BASIC METABOLIC PANEL
BUN/Creatinine Ratio: 12 (ref 10–24)
BUN: 13 mg/dL (ref 8–27)
CO2: 28 mmol/L (ref 20–29)
Calcium: 8.9 mg/dL (ref 8.6–10.2)
Chloride: 104 mmol/L (ref 96–106)
Creatinine, Ser: 1.06 mg/dL (ref 0.76–1.27)
Glucose: 117 mg/dL — ABNORMAL HIGH (ref 65–99)
Potassium: 4.1 mmol/L (ref 3.5–5.2)
Sodium: 143 mmol/L (ref 134–144)
eGFR: 73 mL/min/{1.73_m2} (ref >59)

## 2021-03-12 MED ORDER — PRALUENT 150 MG/ML ~~LOC~~ SOAJ
150.0000 mg | SUBCUTANEOUS | 12 refills | Status: DC
Start: 2021-03-12 — End: 2021-03-26

## 2021-03-12 NOTE — Telephone Encounter (Signed)
[facility] pharmacy per patient request to follow up on Praluent. Spoke with switchboard and they provided extension to call once they open at 8 am. Will try to call back here shortly.

## 2021-03-12 NOTE — Telephone Encounter (Signed)
Spoke with patient to provide him update on pharmacy status. He is requesting that I call [facility], his primary [facility] provider, and then provide him update once I speak with them. Advised I would attempt to call them but not sure if they will take orders from via phone. He was appreciative for the time and follow up. Advised that I will update him on the status once we determine the next steps.

## 2021-03-12 NOTE — Telephone Encounter (Signed)
Patient calling for status update on prescription.

## 2021-03-12 NOTE — Telephone Encounter (Signed)
Called over to [facility] in [place] per patient request to speak with PCP staff to confirm or review orders for Praluent. Also to see if prescription is available there at the [facility] pharmacy. On hold for extended amounts of time for all numbers called. Transferred to primary care office. Provided them message to review with Dr. Franchot Heidelberg. Request is for them to prescribe Praluent 150 mg so that patient can get at [facility] for free. Provided our return number with no need to call back if they can prescribe for him.   Phone numbers for primary care provider is [phone] and speak with operator. Pharmacy number is [phone] ext [phone].    Mandie RN- If Dr. Thornell Sartorius Trivedi's office at the [facility] calls back please see if they can prescribe Praluent 150 mg for patient to get at the [facility]. Please call patient with update on outcome.

## 2021-03-12 NOTE — Telephone Encounter (Signed)
Spoke with patient and reviewed that I called and left message with the cardiology office. Ad[facility] Reviewed that I would send this information to Dr. Donivan Scull nurse for further follow up on this request since I will be out of the office. Also instructed him to call back if he does not hear update on the status of this medication. He verbalized understanding with no further questions at this time.

## 2021-03-13 ENCOUNTER — Other Ambulatory Visit (HOSPITAL_COMMUNITY): Payer: Self-pay

## 2021-03-13 MED ORDER — AMIODARONE HCL 200 MG PO TABS: ORAL_TABLET | ORAL | 6 refills | Status: DC

## 2021-03-13 NOTE — Telephone Encounter (Signed)
Patient would like to know the status of getting his Praluent

## 2021-03-13 NOTE — Addendum Note (Signed)
Addended by: Alvis Lemmings C on: 03/13/2021 12:51 PM   Modules accepted: Orders

## 2021-03-13 NOTE — Telephone Encounter (Signed)
Reached back out to [name] regarding his Praluent medication, advised Pam, RN sent in script yesterday to the [facility] in [place] to have them fill, could take 2-3 days for [name] to fill medication or refuse order. [facility] in [facility] days if he has not heard anything. Pt reports is [facility] fills, med will be at not cost to him, however, if [facility] denies, may need to send to his alternate pharmacy and may have a co-pay, which pt is okay with if "not too costly"  Pt also concern for amiodarone refill, reports on his MyChart, his amiodarone states   "TAKE 1/2 TABLET BY MOUTH 5 DAYS PER WEEK"  Pt called pharmacy and they are unable to fill (too early to fill and pt has ran out) pt also reports he thought he was suppose to take medication BID 1/2 tab, he is upset "MyChart saying different and no one called to update me on this medication change".  Per [facility] notes from 11/2020 with Dr. Caryl Comes  1) DECREASE amiodarone 200 mg- Take 1/2 (one-half) tablet by mouth twice a day 5 days a week    (DO not skip 2 consecutive days in a row)  Advised pt will reach out to Dr. Olin Pia nurse for clarification and she will be in contact to make sure he is on correct dosing of his amiodarone and he is worried about A-fib flare ups.   [name] thankful for the phone call No other concerns at this time Will await call back from Dr. Olin Pia RN, Nira Conn.

## 2021-03-13 NOTE — Telephone Encounter (Signed)
Reviewed the patient's chart regarding his amiodarone RX: 12/11/20- Dr. Caryl Comes advised the patient to - decrease amiodarone 200 mg- take 0.5 tablet (100 mg) by mouth twice daily 5 days a week  12/21/20- per his office visit with Dr. Rockey Situ, his medication list was still reading amiodarone 200 mg- take 0.5 tablet (100 mg) by mouth twice daily 5 days a week  01/09/21- he had a RX sent in for amiodarone 200 mg TAKE 1/2 TABLET BY MOUTH 5 DAYS PER WEEK  01/24/21- he was seen by Cadence Furth, PA and medication list was still reading   amiodarone (PACERONE) 200 MG tablet TAKE 1/2 TABLET BY MOUTH 5 DAYS PER WEEK    03/11/21- he was back in the office seeing Cadence Furth, [place] and medication list once again was reading:  amiodarone (PACERONE) 200 MG tablet TAKE 1/2 TABLET BY MOUTH 5 DAYS PER WEEK (Patient taking differently: Take 200 mg by mouth daily.)   I have called the patient and advised him that since Dr. Caryl Comes changed his amiodarone dose to 200 mg - 1/2 tablet (100 mg) BID on 12/11/20, no other provider has changed his dose. Therefore, I think the RX that was sent in on 01/09/21 was sent in in error.  The patient confirms he has continued to take amiodarone dose to 200 mg - 1/2 tablet (100 mg) BID, however, he is running out of his RX early.  I have advised him I will correct the RX at the pharmacy. I also inquired if all of his medications were reviewed with him verbally at his 01/24/21 & 03/11/21 office visits as the errors should have been corrected at those times. The patient advised he did feel like these were gone over.  I will correct his RX and call into the pharmacy.   The patient voices understanding and is agreeable.

## 2021-03-14 ENCOUNTER — Ambulatory Visit (INDEPENDENT_AMBULATORY_CARE_PROVIDER_SITE_OTHER): Payer: Medicare HMO

## 2021-03-14 DIAGNOSIS — I495 Sick sinus syndrome: Secondary | ICD-10-CM

## 2021-03-14 DIAGNOSIS — I480 Paroxysmal atrial fibrillation: Secondary | ICD-10-CM

## 2021-03-14 LAB — CUP PACEART REMOTE DEVICE CHECK
Battery Remaining Longevity: 56 mo
Battery Voltage: 2.99 V
Brady Statistic AP VP Percent: 0.88 %
Brady Statistic AP VS Percent: 96.58 %
Brady Statistic AS VP Percent: 0 %
Brady Statistic AS VS Percent: 2.54 %
Brady Statistic RA Percent Paced: 97.46 %
Brady Statistic RV Percent Paced: 0.92 %
Date Time Interrogation Session: 20220825100043
Implantable Lead Implant Date: 20170814
Implantable Lead Implant Date: 20170814
Implantable Lead Location: 753859
Implantable Lead Location: 753860
Implantable Lead Model: 5076
Implantable Lead Model: 5076
Implantable Pulse Generator Implant Date: 20170814
Lead Channel Impedance Value: 342 Ohm
Lead Channel Impedance Value: 437 Ohm
Lead Channel Impedance Value: 494 Ohm
Lead Channel Impedance Value: 570 Ohm
Lead Channel Pacing Threshold Amplitude: 0.75 V
Lead Channel Pacing Threshold Amplitude: 0.875 V
Lead Channel Pacing Threshold Pulse Width: 0.4 ms
Lead Channel Pacing Threshold Pulse Width: 0.4 ms
Lead Channel Sensing Intrinsic Amplitude: 22.5 mV
Lead Channel Sensing Intrinsic Amplitude: 22.5 mV
Lead Channel Sensing Intrinsic Amplitude: 3.625 mV
Lead Channel Sensing Intrinsic Amplitude: 3.625 mV
Lead Channel Setting Pacing Amplitude: 2 V
Lead Channel Setting Pacing Amplitude: 2.5 V
Lead Channel Setting Pacing Pulse Width: 0.4 ms
Lead Channel Setting Sensing Sensitivity: 0.9 mV

## 2021-03-14 NOTE — Telephone Encounter (Signed)
Patient calling  Says he will be having a script faxed over and wants to discuss ASAP

## 2021-03-21 ENCOUNTER — Inpatient Hospital Stay (HOSPITAL_BASED_OUTPATIENT_CLINIC_OR_DEPARTMENT_OTHER): Payer: Medicare HMO | Admitting: Internal Medicine

## 2021-03-21 ENCOUNTER — Inpatient Hospital Stay: Payer: Medicare HMO | Attending: Internal Medicine

## 2021-03-21 DIAGNOSIS — D649 Anemia, unspecified: Secondary | ICD-10-CM | POA: Insufficient documentation

## 2021-03-21 DIAGNOSIS — C9110 Chronic lymphocytic leukemia of B-cell type not having achieved remission: Secondary | ICD-10-CM | POA: Diagnosis present

## 2021-03-21 DIAGNOSIS — I4891 Unspecified atrial fibrillation: Secondary | ICD-10-CM | POA: Insufficient documentation

## 2021-03-21 DIAGNOSIS — Z7901 Long term (current) use of anticoagulants: Secondary | ICD-10-CM | POA: Diagnosis not present

## 2021-03-21 DIAGNOSIS — R197 Diarrhea, unspecified: Secondary | ICD-10-CM | POA: Diagnosis not present

## 2021-03-21 DIAGNOSIS — I509 Heart failure, unspecified: Secondary | ICD-10-CM | POA: Diagnosis not present

## 2021-03-21 DIAGNOSIS — M791 Myalgia, unspecified site: Secondary | ICD-10-CM | POA: Diagnosis not present

## 2021-03-21 LAB — CBC WITH DIFFERENTIAL/PLATELET
Abs Immature Granulocytes: 0.02 10*3/uL (ref 0.00–0.07)
Basophils Absolute: 0.1 10*3/uL (ref 0.0–0.1)
Basophils Relative: 1 %
Eosinophils Absolute: 0.1 10*3/uL (ref 0.0–0.5)
Eosinophils Relative: 2 %
HCT: 37 % — ABNORMAL LOW (ref 39.0–52.0)
Hemoglobin: 12.3 g/dL — ABNORMAL LOW (ref 13.0–17.0)
Immature Granulocytes: 0 %
Lymphocytes Relative: 27 %
Lymphs Abs: 1.4 10*3/uL (ref 0.7–4.0)
MCH: 34.4 pg — ABNORMAL HIGH (ref 26.0–34.0)
MCHC: 33.2 g/dL (ref 30.0–36.0)
MCV: 103.4 fL — ABNORMAL HIGH (ref 80.0–100.0)
Monocytes Absolute: 0.8 10*3/uL (ref 0.1–1.0)
Monocytes Relative: 14 %
Neutro Abs: 3 10*3/uL (ref 1.7–7.7)
Neutrophils Relative %: 56 %
Platelets: 135 10*3/uL — ABNORMAL LOW (ref 150–400)
RBC: 3.58 MIL/uL — ABNORMAL LOW (ref 4.22–5.81)
RDW: 13.5 % (ref 11.5–15.5)
WBC: 5.4 10*3/uL (ref 4.0–10.5)
nRBC: 0 % (ref 0.0–0.2)

## 2021-03-21 LAB — COMPREHENSIVE METABOLIC PANEL
ALT: 26 U/L (ref 0–44)
AST: 23 U/L (ref 15–41)
Albumin: 4.2 g/dL (ref 3.5–5.0)
Alkaline Phosphatase: 90 U/L (ref 38–126)
Anion gap: 7 (ref 5–15)
BUN: 22 mg/dL (ref 8–23)
CO2: 27 mmol/L (ref 22–32)
Calcium: 8.7 mg/dL — ABNORMAL LOW (ref 8.9–10.3)
Chloride: 105 mmol/L (ref 98–111)
Creatinine, Ser: 1.16 mg/dL (ref 0.61–1.24)
GFR, Estimated: >60 mL/min (ref >60)
Glucose, Bld: 106 mg/dL — ABNORMAL HIGH (ref 70–99)
Potassium: 3.5 mmol/L (ref 3.5–5.1)
Sodium: 139 mmol/L (ref 135–145)
Total Bilirubin: 1.2 mg/dL (ref 0.3–1.2)
Total Protein: 6.8 g/dL (ref 6.5–8.1)

## 2021-03-21 LAB — LACTATE DEHYDROGENASE: LDH: 161 U/L (ref 98–192)

## 2021-03-21 NOTE — Assessment & Plan Note (Addendum)
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 MARCH 25th, 2022 CT C/A/P- NED- STABLE.   # currently on Venoclax [100 mg sec to diarrhea; DI with Amio];  Labs today reviewed;-STABLE.   #Diarrhea-G-1 ? venatolclax- monitor for now.STABLE.   #Low immunoglobulins IgG 330/recent severe respiratory infection/CLL-s/pI IVIG #4/4 [last April 2022]. STABLE.    # A.fib-on amiodarone [drug interaction-amiodarone; Dr.Gollan] sinus rhythm-on Eliquis/CHF- lasix 40/d- s/p Cath [AUG, 2022- Echo-55%] -STABLE.    # MIld Anemia Hb ~11-12; June 2022-ferritin 17 iron saturation 33%   # Muscle pain-sec to Ventoclax-G-1-2; continue tramadol/CBD prn- -STABLE.; on prn Tramdaol.   DISPOSITION:  # follow up in 6 weeks MD; labs- cbc/cmp/ldh;B12/folic acid; Dr.B

## 2021-03-21 NOTE — Progress Notes (Signed)
[facility] OFFICE PROGRESS NOTE  Patient Care Team: Leone Haven, MD as PCP - General (Family Medicine) Rockey Situ Kathlene November, MD as PCP - Cardiology (Cardiology) [name], MD as Consulting Physician (Cardiology) Cammie Sickle, MD as Medical Oncologist (Hematology and Oncology)  Cancer Staging No matching staging information was found for the patient.   Oncology History Overview Note  # AUG 2015- SLL/CLL [Right Ax Ln Bx] s/p Benda-Rituxan x6 [finished March 2016]; Maintenance Rituxan q 41m [started April 2016; Dr.Pandit];Last Ritux Jan 2017.  MARCH 2017- CT N/C/A/P- NED. STOP Ritux; surveillance   # AUG 2019- CT/PET- progression/NO transformation;  # NOV 2019- Progression; started Ibrutinib 420 mg/d. STOPPED in end of feb sec to extreme fatigue/joint pains/cramps  #November 01, 2018-start ibrutinib 280 mg a day; December 20, 2018-discontinue ibrutinib secondary multiple side effects.   #January 03, 2019 start [place] [July]; finished Dec 2020-; started [facility] maintenance [200 mg a day; DI-amiodarone]  #Mid March 2021-venetoclax dose reduced to 100 mg [second diarrhea].   #? 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; IGVH- UN-MUTATED [poor prognosis]  SURVIVORSHIP: p  DIAGNOSIS: CLL   STAGE: IV  ;GOALS: control  CURRENT/MOST RECENT THERAPY : venetoclax     CLL (chronic lymphocytic leukemia) ([place])  01/03/2019 -  Chemotherapy   The patient had obinutuzumab (GAZYVA) 100 mg in sodium chloride 0.9 % 100 mL (0.9615 mg/mL) chemo infusion, 100 mg, Intravenous, Once, 6 of 6 cycles Administration: 100 mg (01/03/2019), 900 mg (01/04/2019), 1,000 mg (01/10/2019),  1,000 mg (01/31/2019), 1,000 mg (01/17/2019), 1,000 mg (02/28/2019), 1,000 mg (04/07/2019), 1,000 mg (05/05/2019), 1,000 mg (06/02/2019)   for chemotherapy treatment.       INTERVAL HISTORY:  [name] 75 y.o.  male CLL-high risk currently on venetoclax is here for follow-up.  In the interim patient has been diagnosed with congestive heart failure; s/p cardiac cath-noted to have CAD however no stents placed.  2D echo- EF-55%  Chronic mild joint pains.  Mild loose stools 1-2.  Denies any nausea vomiting.  Denies any headaches.  No weight loss.  No night sweats.  \Review of Systems  Constitutional:  Positive for malaise/fatigue. Negative for chills, diaphoresis and fever.  HENT:  Negative for nosebleeds and sore throat.   Eyes:  Negative for double vision.  Respiratory:  Negative for hemoptysis, shortness of breath and wheezing.   Cardiovascular:  Negative for chest pain, palpitations, orthopnea and leg swelling.  Gastrointestinal:  Positive for diarrhea. Negative for abdominal pain, blood in stool, constipation, heartburn, melena, nausea and vomiting.  Genitourinary:  Negative for dysuria, frequency and urgency.  Musculoskeletal:  Positive for back pain, joint pain and myalgias.  Skin: Negative.  Negative for itching and rash.  Neurological:  Negative for dizziness, tingling, focal weakness and headaches.  Psychiatric/Behavioral:  Negative for depression. The patient is not nervous/anxious and does not have insomnia.      PAST MEDICAL HISTORY :  Past Medical History:  Diagnosis Date   Anxiety    Arthritis    Atrial fibrillation ([place])    a. Dx 2013, recurred 02/2014, CHA2DS2VASc = 3 -->placed on Eliquis;  b. 02/2014 Echo: EF 50-55%, mid and apical anterior septum and mid and  apical inf septum are abnl, mild to mod Ao sclerosis w/o AS.   Cancer associated pain    Chicken pox    Chronic lymphocytic leukemia ([place])    a. Dx 02/2014.   CLL (chronic lymphocytic leukemia) ([facility])     Complication of anesthesia    History of  PTSD--do not touch patient when waking up from surgery.   COPD (chronic obstructive pulmonary disease) ([facility])    Coronary artery disease    a. 04/2009 CABG x 3 (LIMA->LAD, VG->OM1, VG->PDA);  b. 09/2009 Cath: occluded VG x 2 w/ patent LIMA and L->R collats. EF 55%, mild antlat HK;  c. 10/2011 MV: EF 53%, no isch/infarct-->low risk.   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   TIA (transient ischemic attack) 11/02/2015    PAST SURGICAL HISTORY :   Past Surgical History:  Procedure Laterality Date   ABDOMINAL HERNIA REPAIR     APPENDECTOMY  06/21/1985   CARDIAC CATHETERIZATION  2010; 2011   ; Dr Fletcher Anon   CORONARY ARTERY BYPASS GRAFT  04/2009   "CABG X3"   EP IMPLANTABLE DEVICE N/A 03/03/2016   Procedure: Pacemaker Implant;  Surgeon: Deboraha Sprang, MD;  Location: [facility];  Service: Cardiovascular;  Laterality: N/A;   FOREIGN BODY REMOVAL  1968   "shrapnel in my tailbone"   INGUINAL HERNIA REPAIR Right    INSERT / REPLACE / [place] Right 2018   LAPAROSCOPIC CHOLECYSTECTOMY     RIGHT/LEFT HEART CATH AND CORONARY/GRAFT ANGIOGRAPHY N/A 02/04/2021   Procedure: RIGHT/LEFT HEART CATH AND CORONARY/GRAFT ANGIOGRAPHY;  Surgeon: Nelva Bush, MD;  Location: [facility];  Service: Cardiovascular;  Laterality: N/A;   TONSILLECTOMY AND ADENOIDECTOMY  1956   TOTAL HIP ARTHROPLASTY Right 10/22/2016   Procedure: TOTAL HIP ARTHROPLASTY;  Surgeon: Dereck Leep, MD;   Location: [facility];  Service: Orthopedics;  Laterality: Right;   TOTAL HIP ARTHROPLASTY Left 11/04/2017   Procedure: TOTAL HIP ARTHROPLASTY;  Surgeon: Dereck Leep, MD;  Location: [facility];  Service: Orthopedics;  Laterality: Left;    FAMILY HISTORY :   Family History  Problem Relation Age of Onset   Heart disease Mother    Heart attack Mother    Coronary artery disease Other        family history    SOCIAL HISTORY:   Social History   Tobacco Use   Smoking status: Former    Packs/day: 1.00    Years: 40.00    Pack years: 40.00    Types: Cigarettes    Quit date: 07/21/2006    Years since quitting: 14.6   Smokeless tobacco: Never  Vaping Use   Vaping Use: Former  Substance Use Topics   Alcohol use: Not Currently   Drug use: No    ALLERGIES:  has No Known Allergies.  MEDICATIONS:  Current Outpatient Medications  Medication Sig Dispense Refill   acetaminophen (TYLENOL) 500 MG tablet Take  1,000 mg by mouth every 8 (eight) hours as needed for mild pain.     acyclovir (ZOVIRAX) 400 MG tablet TAKE 1 TABLET(400 MG) BY MOUTH TWICE DAILY 60 tablet 6   albuterol (PROVENTIL HFA;VENTOLIN HFA) 108 (90 Base) MCG/ACT inhaler Inhale 2 puffs into the lungs every 6 (six) hours as needed for wheezing or shortness of breath. 1 Inhaler 11   Alirocumab (PRALUENT) 150 MG/ML SOAJ Inject 150 mg into the skin every 14 (fourteen) days. 2 mL 12   amiodarone (PACERONE) 200 MG tablet Take 1/2 tablet (100 mg) by mouth twice daily 5 days a week 60 tablet 6   apixaban (ELIQUIS) 5 MG TABS tablet Take 5 mg by mouth 2 (two) times daily.     azelastine (ASTELIN) 0.1 % nasal spray Place 2 sprays into both nostrils 2 (two) times daily. Use in each nostril as directed 30 mL 3   budesonide-formoterol (SYMBICORT) 160-4.5 MCG/ACT inhaler Inhale 2 puffs into the lungs 2 (two) times daily. 1 Inhaler 11   cetirizine (ZYRTEC) 10 MG tablet Take 10 mg by mouth daily as needed for allergies.      Coenzyme Q10 (COQ10) 200  MG CAPS Take 200 mg by mouth daily.     ezetimibe (ZETIA) 10 MG tablet Take 1 tablet (10 mg total) by mouth daily. 90 tablet 3   fluticasone-salmeterol (ADVAIR) 100-50 MCG/ACT AEPB Inhale 1 puff into the lungs 2 (two) times daily.     furosemide (LASIX) 20 MG tablet Take 2 tablets (40 mg total) by mouth daily. 180 tablet 3   isosorbide mononitrate (IMDUR) 30 MG 24 hr tablet Take 1 tablet (30 mg total) by mouth daily. 30 tablet 11   Krill Oil 350 MG CAPS Take 350 mg by mouth daily as needed (Heart).     metoprolol tartrate (LOPRESSOR) 50 MG tablet Take 1 tablet (50 mg total) by mouth 2 (two) times daily. 180 tablet 3   mirtazapine (REMERON) 15 MG tablet Take 15 mg by mouth at bedtime as needed (for panic associated with PTSD).      Multiple Vitamin (MULTIVITAMIN WITH MINERALS) TABS tablet Take 1 tablet by mouth daily.     nitroGLYCERIN (NITROSTAT) 0.4 MG SL tablet Place 1 tablet (0.4 mg total) under the tongue every 5 (five) minutes as needed for chest pain. 25 tablet 6   Tiotropium Bromide Monohydrate 2.5 MCG/ACT AERS Inhale 2 puffs into the lungs at bedtime. Spiriva     venetoclax (VENCLEXTA) 100 MG tablet TAKE 1 TABLET BY MOUTH ONCE DAILY 30 tablet 0   traMADol (ULTRAM) 50 MG tablet Take 1 tablet (50 mg total) by mouth every 12 (twelve) hours as needed. (Patient not taking: Reported on 03/21/2021) 60 tablet 0   No current facility-administered medications for this visit.    PHYSICAL EXAMINATION: ECOG PERFORMANCE STATUS: 1 - Symptomatic but completely ambulatory  BP 122/60   Pulse 64   Temp 98.6 F (37 C) (Tympanic)   Resp 20   Ht $R'5\' 8"'Um$  (1.727 m)   Wt 225 lb 6.4 oz (102.2 kg)   BMI 34.27 kg/m   Filed Weights   03/21/21 0916  Weight: 225 lb 6.4 oz (102.2 kg)    Physical Exam Constitutional:      Comments: Patient is alone.  HENT:     Head: Normocephalic and atraumatic.     Mouth/Throat:     Pharynx: No oropharyngeal exudate.  Eyes:     Pupils: Pupils are equal, round, and  reactive to light.  Cardiovascular:     Rate and Rhythm: Normal rate and regular rhythm.  Pulmonary:     Effort: No respiratory distress.     Breath sounds: No wheezing.  Abdominal:     General: Bowel sounds are normal. There is no distension.     Palpations: Abdomen is soft. There is no mass.     Tenderness: no abdominal tenderness There is no guarding or rebound.  Musculoskeletal:        General: No tenderness. Normal range of motion.     Cervical back: Normal range of motion and neck supple.  Lymphadenopathy:     Comments: Resolved lymph nodes in the neck underarms.  Skin:    General: Skin is warm.  Neurological:     Mental Status: He is alert and oriented to person, place, and time.  Psychiatric:        Mood and Affect: Affect normal.    LABORATORY DATA:  I have reviewed the data as listed    Component Value Date/Time   NA 139 03/21/2021 0852   NA 143 03/11/2021 1136   NA 139 10/11/2014 1800   K 3.5 03/21/2021 0852   K 3.3 (L) 10/11/2014 1800   CL 105 03/21/2021 0852   CL 106 10/11/2014 1800   CO2 27 03/21/2021 0852   CO2 27 10/11/2014 1800   GLUCOSE 106 (H) 03/21/2021 0852   GLUCOSE 107 (H) 10/11/2014 1800   BUN 22 03/21/2021 0852   BUN 13 03/11/2021 1136   BUN 15 10/11/2014 1800   CREATININE 1.16 03/21/2021 0852   CREATININE 0.89 10/11/2014 1800   CALCIUM 8.7 (L) 03/21/2021 0852   CALCIUM 8.8 (L) 10/11/2014 1800   PROT 6.8 03/21/2021 0852   PROT 6.7 05/18/2017 1048   PROT 6.4 (L) 10/11/2014 1800   ALBUMIN 4.2 03/21/2021 0852   ALBUMIN 4.3 05/18/2017 1048   ALBUMIN 4.1 10/11/2014 1800   AST 23 03/21/2021 0852   AST 23 10/11/2014 1800   ALT 26 03/21/2021 0852   ALT 22 10/11/2014 1800   ALKPHOS 90 03/21/2021 0852   ALKPHOS 61 10/11/2014 1800   BILITOT 1.2 03/21/2021 0852   BILITOT 0.7 05/18/2017 1048   BILITOT 0.9 10/11/2014 1800   GFRNONAA >60 03/21/2021 0852   GFRNONAA >60 10/11/2014 1800   GFRAA >60 04/12/2020 0959   GFRAA >60 10/11/2014 1800     No results found for: SPEP, UPEP  Lab Results  Component Value Date   WBC 5.4 03/21/2021   NEUTROABS 3.0 03/21/2021   HGB 12.3 (L) 03/21/2021   HCT 37.0 (L) 03/21/2021   MCV 103.4 (H) 03/21/2021   PLT 135 (L) 03/21/2021      Chemistry      Component Value Date/Time   NA 139 03/21/2021 0852   NA 143 03/11/2021 1136   NA 139 10/11/2014 1800   K 3.5 03/21/2021 0852   K 3.3 (L) 10/11/2014 1800   CL 105 03/21/2021 0852   CL 106 10/11/2014 1800   CO2 27 03/21/2021 0852   CO2 27 10/11/2014 1800   BUN 22 03/21/2021 0852   BUN 13 03/11/2021 1136   BUN 15 10/11/2014 1800   CREATININE 1.16 03/21/2021 0852   CREATININE 0.89 10/11/2014 1800      Component Value Date/Time   CALCIUM 8.7 (L) 03/21/2021 0852   CALCIUM 8.8 (L) 10/11/2014 1800   ALKPHOS 90 03/21/2021 0852   ALKPHOS 61 10/11/2014 1800   AST 23 03/21/2021 0852   AST 23 10/11/2014 1800  ALT 26 03/21/2021 0852   ALT 22 10/11/2014 1800   BILITOT 1.2 03/21/2021 0852   BILITOT 0.7 05/18/2017 1048   BILITOT 0.9 10/11/2014 1800       RADIOGRAPHIC STUDIES: I have personally reviewed the radiological images as listed and agreed with the findings in the report. No results found.   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 MARCH 25th, 2022 CT C/A/P- NED- STABLE.   # currently on Venoclax [100 mg sec to diarrhea; DI with Amio];  Labs today reviewed;-STABLE.   #Diarrhea-G-1 ? venatolclax- monitor for now.STABLE.   #Low immunoglobulins IgG 330/recent severe respiratory infection/CLL-s/pI IVIG #4/4 [last April 2022]. STABLE.    # A.fib-on amiodarone [drug interaction-amiodarone; Dr.Gollan] sinus rhythm-on Eliquis/CHF- lasix 40/d- s/p Cath [AUG, 2022- Echo-55%] -STABLE.    # MIld Anemia Hb ~11-12; June 2022-ferritin 17 iron saturation 33%   #  Muscle pain-sec to Ventoclax-G-1-2; continue tramadol/CBD prn- -STABLE.; on prn Tramdaol.   DISPOSITION:  # follow up in 6 weeks MD; labs- cbc/cmp/ldh;B12/folic acid; Dr.B      Orders Placed This Encounter  Procedures   CBC with Differential    Standing Status:   Future    Standing Expiration Date:   03/21/2022   Comprehensive metabolic panel    Standing Status:   Future    Standing Expiration Date:   03/21/2022   Lactate dehydrogenase    Standing Status:   Future    Standing Expiration Date:   03/21/2022   Vitamin B12    Standing Status:   Future    Standing Expiration Date:   03/21/2022   Folate    Standing Status:   Future    Standing Expiration Date:   03/21/2022    All questions were answered. The patient knows to call the clinic with any problems, questions or concerns.      Cammie Sickle, MD 03/21/2021 9:30 PM

## 2021-03-26 MED ORDER — PRALUENT 150 MG/ML ~~LOC~~ SOAJ: 150.0000 mg | SUBCUTANEOUS | 12 refills | Status: DC

## 2021-03-26 NOTE — Addendum Note (Signed)
Addended by: Wynema Birch on: 03/26/2021 04:29 PM   Modules accepted: Orders

## 2021-03-26 NOTE — Telephone Encounter (Signed)
Spoke with Pharmacy tech at the [facility] in [place], she advised they have not yet filled the script for pt's Praluent.   Technician was giving this RN little information as "not a family member or the veteran himself". Advised that I am the register nurse that is assigned to this mutual patient under the care of Dr. Rockey Situ cardiologist. That Dr. Rockey Situ signed the script and this RN sent int he script on 03/12/2021.  Question if pt self referred himself to Dr. Donivan Scull (cardiologist) or if the [facility] approved for pt to see an outside cardiologist as this will reflect if the [place] will cover the medication. Advised that pt has been seeing this [facility] clinic over the past 5 years and it appears his Lopressor is being filled by the [place] in [place] as this was also signed by Dr. Rockey Situ back in July 2022.  Technician will not give details to this RN weather the medication will be coverage through the [place], did give fax number of [phone] to fax over the script for [name] and is also transferring call to a clinician to see if they will take the script via phone and fill the medication.   Spoke with [name], gave detail to the medication  Pralunet '150mg'$ /ml  SOAJ Every 14 days Self administer  advised sent in the [facility] on 03/12/2022, was [facility] to [facility] and if I needed to resubmit the script through fax. Currently be transfer to "in-house" pharmacist.   Was advised that medication will need to be order by pt's PCP through the [place]. [name] M.D. in order to have approval. [name] attempted to transfer me to Dr. Franchot Heidelberg, but was unsuccessful, cannot find his number in the system or fax number. She will have to call back with information.   13:49 (incoming call) [name] with [facility] called in with fax number to Orland Mustard M.D. [phone]   Fax sent to [phone] attention Dr. Franchot Heidelberg along with the script signed by Dr. Rockey Situ for [name].  Requesting Dr. Franchot Heidelberg to send in the script to the [facility] hospital under his name some approval is granted and covered.   Phone numbers for primary care provider is [phone] and speak with operator. Pharmacy number is [phone] ext [phone].

## 2021-03-26 NOTE — Telephone Encounter (Signed)
Praluent sent to [facility] Multiple failed attempts to the [place] to have coverage PCP Dr. Franchot Heidelberg will not return calls or fax  Advised pharmacy of possible co-pay card [phone] co-pay card  BIN# [id number]  PCN# [id number]  [id number]# [id number]  ID# [id number]

## 2021-03-26 NOTE — Telephone Encounter (Signed)
[facility] with [facility] success .     Patient frustrated with this and wants to know if mandie will send rx to [facility] in [place] .     Please call per request

## 2021-03-26 NOTE — Telephone Encounter (Signed)
Patient states he has still has not received his medication (Praluent). Please call to discuss.

## 2021-03-26 NOTE — Telephone Encounter (Signed)
Reach out to Mr. [place] to give update and status of his [place] Advised for him to also reach out to Dr. Franchot Heidelberg as well  Mr. [place] very thankful for the effort regarding medication Mr. Elter will keep trying to get in touch with Mr. Franchot Heidelberg as he has had failed attempts as well with Mr. Franchot Heidelberg

## 2021-03-26 NOTE — Addendum Note (Signed)
Addended by: Wynema Birch on: 03/26/2021 02:22 PM   Modules accepted: Orders

## 2021-03-27 NOTE — Telephone Encounter (Signed)
Pt has been approved for Praluent 07/21/2020-07/20/2021.

## 2021-03-27 NOTE — Telephone Encounter (Signed)
Received PA for Praluent from [employer]. PA has been started and submitted: [id number]  Your information has been submitted to [place] Medicare Part D. [employer] will review the request and will issue a decision, typically within 1-3 days from your submission. You can check the updated outcome later by reopening this request.  If [employer] has not responded in 1-3 days or if you have any questions about your ePA request, please contact [place] Medicare Part D at [phone]. If you think there may be a problem with your PA request, use our live chat feature at the bottom right.

## 2021-03-28 NOTE — Progress Notes (Signed)
Remote pacemaker transmission.   

## 2021-04-09 ENCOUNTER — Other Ambulatory Visit: Payer: Self-pay | Admitting: Internal Medicine

## 2021-04-09 ENCOUNTER — Other Ambulatory Visit (HOSPITAL_COMMUNITY): Payer: Self-pay

## 2021-04-09 MED ORDER — VENETOCLAX 100 MG PO TABS
ORAL_TABLET | Freq: Every day | ORAL | 0 refills | Status: DC
  Filled 2021-04-09: qty 30, 30d supply, fill #0

## 2021-04-11 ENCOUNTER — Other Ambulatory Visit (HOSPITAL_COMMUNITY): Payer: Self-pay

## 2021-05-02 ENCOUNTER — Inpatient Hospital Stay (HOSPITAL_BASED_OUTPATIENT_CLINIC_OR_DEPARTMENT_OTHER): Payer: Medicare HMO | Admitting: Internal Medicine

## 2021-05-02 ENCOUNTER — Other Ambulatory Visit: Payer: Self-pay

## 2021-05-02 ENCOUNTER — Inpatient Hospital Stay: Payer: Medicare HMO

## 2021-05-02 ENCOUNTER — Encounter: Payer: Self-pay | Admitting: Internal Medicine

## 2021-05-02 ENCOUNTER — Inpatient Hospital Stay: Payer: Medicare HMO | Attending: Internal Medicine

## 2021-05-02 DIAGNOSIS — C9110 Chronic lymphocytic leukemia of B-cell type not having achieved remission: Secondary | ICD-10-CM | POA: Insufficient documentation

## 2021-05-02 DIAGNOSIS — I4891 Unspecified atrial fibrillation: Secondary | ICD-10-CM | POA: Insufficient documentation

## 2021-05-02 DIAGNOSIS — M2550 Pain in unspecified joint: Secondary | ICD-10-CM | POA: Insufficient documentation

## 2021-05-02 DIAGNOSIS — Z23 Encounter for immunization: Secondary | ICD-10-CM

## 2021-05-02 DIAGNOSIS — D649 Anemia, unspecified: Secondary | ICD-10-CM | POA: Insufficient documentation

## 2021-05-02 DIAGNOSIS — R197 Diarrhea, unspecified: Secondary | ICD-10-CM | POA: Diagnosis not present

## 2021-05-02 DIAGNOSIS — Z7901 Long term (current) use of anticoagulants: Secondary | ICD-10-CM | POA: Diagnosis not present

## 2021-05-02 DIAGNOSIS — Z87891 Personal history of nicotine dependence: Secondary | ICD-10-CM | POA: Insufficient documentation

## 2021-05-02 DIAGNOSIS — Z79899 Other long term (current) drug therapy: Secondary | ICD-10-CM | POA: Diagnosis not present

## 2021-05-02 LAB — COMPREHENSIVE METABOLIC PANEL
ALT: 25 U/L (ref 0–44)
AST: 22 U/L (ref 15–41)
Albumin: 4 g/dL (ref 3.5–5.0)
Alkaline Phosphatase: 98 U/L (ref 38–126)
Anion gap: 7 (ref 5–15)
BUN: 19 mg/dL (ref 8–23)
CO2: 28 mmol/L (ref 22–32)
Calcium: 8.6 mg/dL — ABNORMAL LOW (ref 8.9–10.3)
Chloride: 105 mmol/L (ref 98–111)
Creatinine, Ser: 0.97 mg/dL (ref 0.61–1.24)
GFR, Estimated: >60 mL/min (ref >60)
Glucose, Bld: 121 mg/dL — ABNORMAL HIGH (ref 70–99)
Potassium: 3.5 mmol/L (ref 3.5–5.1)
Sodium: 140 mmol/L (ref 135–145)
Total Bilirubin: 0.6 mg/dL (ref 0.3–1.2)
Total Protein: 6.5 g/dL (ref 6.5–8.1)

## 2021-05-02 LAB — CBC WITH DIFFERENTIAL/PLATELET
Abs Immature Granulocytes: 0.03 10*3/uL (ref 0.00–0.07)
Basophils Absolute: 0 10*3/uL (ref 0.0–0.1)
Basophils Relative: 0 %
Eosinophils Absolute: 0.1 10*3/uL (ref 0.0–0.5)
Eosinophils Relative: 1 %
HCT: 36.4 % — ABNORMAL LOW (ref 39.0–52.0)
Hemoglobin: 12.3 g/dL — ABNORMAL LOW (ref 13.0–17.0)
Immature Granulocytes: 0 %
Lymphocytes Relative: 26 %
Lymphs Abs: 1.8 10*3/uL (ref 0.7–4.0)
MCH: 34.7 pg — ABNORMAL HIGH (ref 26.0–34.0)
MCHC: 33.8 g/dL (ref 30.0–36.0)
MCV: 102.8 fL — ABNORMAL HIGH (ref 80.0–100.0)
Monocytes Absolute: 0.9 10*3/uL (ref 0.1–1.0)
Monocytes Relative: 13 %
Neutro Abs: 4.1 10*3/uL (ref 1.7–7.7)
Neutrophils Relative %: 60 %
Platelets: 143 10*3/uL — ABNORMAL LOW (ref 150–400)
RBC: 3.54 MIL/uL — ABNORMAL LOW (ref 4.22–5.81)
RDW: 13.6 % (ref 11.5–15.5)
WBC: 7 10*3/uL (ref 4.0–10.5)
nRBC: 0 % (ref 0.0–0.2)

## 2021-05-02 LAB — FOLATE: Folate: 15.6 ng/mL (ref >5.9)

## 2021-05-02 LAB — VITAMIN B12: Vitamin B-12: 1065 pg/mL — ABNORMAL HIGH (ref 180–914)

## 2021-05-02 LAB — LACTATE DEHYDROGENASE: LDH: 136 U/L (ref 98–192)

## 2021-05-02 MED ORDER — INFLUENZA VAC A&B SA ADJ QUAD 0.5 ML IM PRSY
0.5000 mL | PREFILLED_SYRINGE | Freq: Once | INTRAMUSCULAR | Status: AC
  Administered 2021-05-02: 0.5 mL via INTRAMUSCULAR
  Filled 2021-05-02: qty 0.5

## 2021-05-02 NOTE — Progress Notes (Signed)
Patient here for follow up he has no concerns or complaints.

## 2021-05-02 NOTE — Progress Notes (Signed)
[facility] OFFICE PROGRESS NOTE  Patient Care Team: Leone Haven, MD as PCP - General (Family Medicine) Rockey Situ Kathlene November, MD as PCP - Cardiology (Cardiology) [name], MD as Consulting Physician (Cardiology) Cammie Sickle, MD as Medical Oncologist (Hematology and Oncology)  Cancer Staging No matching staging information was found for the patient.   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  MARCH 2017- CT N/C/A/P- NED. STOP Ritux; surveillance   # AUG 2019- CT/PET- progression/NO transformation;  # NOV 2019- Progression; started Ibrutinib 420 mg/d. STOPPED in end of feb sec to extreme fatigue/joint pains/cramps  #November 01, 2018-start ibrutinib 280 mg a day; December 20, 2018-discontinue ibrutinib secondary multiple side effects.   #January 03, 2019 start Gazyva + Ven [July]; finished Dec 2020-; started VLawson Fiscalmaintenance [200 mg a day; DI-amiodarone]; SEP 2022- congestive heart failure; s/p cardiac cath-noted to have CAD however no stents placed.  2D echo- EF-55%  #Mid March 2021-venetoclax dose reduced to 100 mg [second diarrhea].   #? 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; IGVH- UN-MUTATED [poor prognosis]  SURVIVORSHIP: p  DIAGNOSIS: CLL   STAGE: IV  ;GOALS: control  CURRENT/MOST RECENT THERAPY : venetoclax     CLL [place]chronic lymphocytic leukemia) ([place]  01/03/2019 -  Chemotherapy   The patient had obinutuzumab (GAZYVA) 100 mg in sodium chloride 0.9 % 100 mL (0.9615 mg/mL) chemo infusion, 100 mg,  Intravenous, Once, 6 of 6 cycles Administration: 100 mg (01/03/2019), 900 mg (01/04/2019), 1,000 mg (01/10/2019), 1,000 mg (01/31/2019), 1,000 mg (01/17/2019), 1,000 mg (02/28/2019), 1,000 mg (04/07/2019), 1,000 mg (05/05/2019), 1,000 mg (06/02/2019)   for chemotherapy treatment.       INTERVAL HISTORY:  Michael WARING89y.o.  male CLL-high risk currently on venetoclax is here for follow-up.  Patient has been taken off statins.  Is currently on subcu injections for his hyperlipidemia.  Notes improvement of his myalgias.  Continues to have chronic joint pains not any worse.  Mild loose stools 1-2 a day.  No nausea no vomiting.  No headache.  No weight loss.  No night sweats.   \Review of Systems  Constitutional:  Positive for malaise/fatigue. Negative for chills, diaphoresis and fever.  HENT:  Negative for nosebleeds and sore throat.   Eyes:  Negative for double vision.  Respiratory:  Negative for hemoptysis, shortness of breath and wheezing.   Cardiovascular:  Negative for chest pain, palpitations, orthopnea and leg swelling.  Gastrointestinal:  Positive for diarrhea. Negative for abdominal pain, blood in stool, constipation, heartburn, melena, nausea and vomiting.  Genitourinary:  Negative for dysuria, frequency and urgency.  Musculoskeletal:  Positive for back pain, joint pain and myalgias.  Skin: Negative.  Negative for itching and rash.  Neurological:  Negative for dizziness, tingling, focal weakness and headaches.  Psychiatric/Behavioral:  Negative for depression. The patient is not nervous/anxious and does not have insomnia.      PAST MEDICAL HISTORY :  Past Medical History:  Diagnosis Date  . Anxiety   . Arthritis   . Atrial fibrillation ([place]    a. Dx 2013,  recurred 02/2014, CHA2DS2VASc = 3 -->placed on Eliquis;  b. 02/2014 Echo: EF 50-55%, mid and apical anterior septum and mid and apical inf septum are abnl, mild to mod Ao sclerosis w/o AS.  Marland Kitchen Cancer associated pain   .  Chicken pox   . Chronic lymphocytic leukemia ([place])    a. Dx 02/2014.  Marland Kitchen CLL (chronic lymphocytic leukemia) ([place])   . Complication of anesthesia    History of  PTSD--do not touch patient when waking up from surgery.  Marland Kitchen COPD (chronic obstructive pulmonary disease) ([place])   . Coronary artery disease    a. 04/2009 CABG x 3 (LIMA->LAD, VG->OM1, VG->PDA);  b. 09/2009 Cath: occluded VG x 2 w/ patent LIMA and L->R collats. EF 55%, mild antlat HK;  c. 10/2011 MV: EF 53%, no isch/infarct-->low risk.  Marland Kitchen Dysrhythmia    hx of a-fib  . GERD (gastroesophageal reflux disease)    occasional  . History of chemotherapy 2015-2016  . HOH (hard of hearing)    Bilateral Hearing Aids  . Hypertension   . Myocardial infarction ([place]) 2010  . OSA on CPAP    USE C-PAP  . Presence of permanent cardiac pacemaker 2017  . PTSD (post-traumatic stress disorder)   . PTSD (post-traumatic stress disorder)   . Pure hypercholesterolemia   . Rheumatic fever 1959  . 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 TIA (transient ischemic attack) 11/02/2015    PAST SURGICAL HISTORY :   Past Surgical History:  Procedure Laterality Date  . ABDOMINAL HERNIA REPAIR    . APPENDECTOMY  06/21/1985  . CARDIAC CATHETERIZATION  2010; 2011   ; Dr Fletcher Anon  . CORONARY ARTERY BYPASS GRAFT  04/2009   "CABG X3"  . EP IMPLANTABLE DEVICE N/A 03/03/2016   Procedure: Pacemaker Implant;  Surgeon: Deboraha Sprang, MD;  Location: [facility];  Service: Cardiovascular;  Laterality: N/A;  . FOREIGN BODY REMOVAL  1968   "shrapnel in my tailbone"  . INGUINAL HERNIA REPAIR Right   . INSERT / REPLACE / REMOVE PACEMAKER    . JOINT REPLACEMENT Right 2018  . LAPAROSCOPIC CHOLECYSTECTOMY    . RIGHT/LEFT HEART CATH AND CORONARY/GRAFT ANGIOGRAPHY N/A 02/04/2021   Procedure: RIGHT/LEFT HEART CATH AND CORONARY/GRAFT ANGIOGRAPHY;  Surgeon: Nelva Bush, MD;  Location[facility];  Service: Cardiovascular;  Laterality: N/A;  .  TONSILLECTOMY AND ADENOIDECTOMY  1956  . TOTAL HIP ARTHROPLASTY Right 10/22/2016   Procedure: TOTAL HIP ARTHROPLASTY;  Surgeon: Dereck Leep, MD;  Location: [facility];  Service: Orthopedics;  Laterality: Right;  . TOTAL HIP ARTHROPLASTY Left 11/04/2017   Procedure: TOTAL HIP ARTHROPLASTY;  Surgeon: Dereck Leep, MD;  Location: [facility];  Service: Orthopedics;  Laterality: Left;    FAMILY HISTORY :   Family History  Problem Relation Age of Onset  . Heart disease Mother   . Heart attack Mother   . Coronary artery disease Other        family history    SOCIAL HISTORY:   Social History   Tobacco Use  . Smoking status: Former    Packs/day: 1.00    Years: 40.00    Pack years: 40.00    Types: Cigarettes    Quit date: 07/21/2006    Years since quitting: 14.7  . Smokeless tobacco: Never  Vaping Use  . Vaping Use: Former  Substance Use Topics  . Alcohol use: Not Currently  . Drug use: No    ALLERGIES:  has  No Known Allergies.  MEDICATIONS:  Current Outpatient Medications  Medication Sig Dispense Refill  . acetaminophen (TYLENOL) 500 MG tablet Take 1,000 mg by mouth every 8 (eight) hours as needed for mild pain.    Marland Kitchen acyclovir (ZOVIRAX) 400 MG tablet TAKE 1 TABLET(400 MG) BY MOUTH TWICE DAILY 60 tablet 6  . albuterol (PROVENTIL HFA;VENTOLIN HFA) 108 (90 Base) MCG/ACT inhaler Inhale 2 puffs into the lungs every 6 (six) hours as needed for wheezing or shortness of breath. 1 Inhaler 11  . Alirocumab (PRALUENT) 150 MG/ML SOAJ Inject 150 mg into the skin every 14 (fourteen) days. 2 mL 12  . amiodarone (PACERONE) 200 MG tablet Take 1/2 tablet (100 mg) by mouth twice daily 5 days a week 60 tablet 6  . apixaban (ELIQUIS) 5 MG TABS tablet Take 5 mg by mouth 2 (two) times daily.    Marland Kitchen azelastine (ASTELIN) 0.1 % nasal spray Place 2 sprays into both nostrils 2 (two) times daily. Use in each nostril as directed 30 mL 3  . budesonide-formoterol (SYMBICORT) 160-4.5 MCG/ACT inhaler Inhale 2 puffs  into the lungs 2 (two) times daily. 1 Inhaler 11  . cetirizine (ZYRTEC) 10 MG tablet Take 10 mg by mouth daily as needed for allergies.     . Coenzyme Q10 (COQ10) 200 MG CAPS Take 200 mg by mouth daily.    Marland Kitchen ezetimibe (ZETIA) 10 MG tablet Take 1 tablet (10 mg total) by mouth daily. 90 tablet 3  . fluticasone-salmeterol (ADVAIR) 100-50 MCG/ACT AEPB Inhale 1 puff into the lungs 2 (two) times daily.    . furosemide (LASIX) 20 MG tablet Take 2 tablets (40 mg total) by mouth daily. 180 tablet 3  . isosorbide mononitrate (IMDUR) 30 MG 24 hr tablet Take 1 tablet (30 mg total) by mouth daily. 30 tablet 11  . Krill Oil 350 MG CAPS Take 350 mg by mouth daily as needed (Heart).    . metoprolol tartrate (LOPRESSOR) 50 MG tablet Take 1 tablet (50 mg total) by mouth 2 (two) times daily. 180 tablet 3  . mirtazapine (REMERON) 15 MG tablet Take 15 mg by mouth at bedtime as needed (for panic associated with PTSD).     . Multiple Vitamin (MULTIVITAMIN WITH MINERALS) TABS tablet Take 1 tablet by mouth daily.    . nitroGLYCERIN (NITROSTAT) 0.4 MG SL tablet Place 1 tablet (0.4 mg total) under the tongue every 5 (five) minutes as needed for chest pain. 25 tablet 6  . Tiotropium Bromide Monohydrate 2.5 MCG/ACT AERS Inhale 2 puffs into the lungs at bedtime. Spiriva    . traMADol (ULTRAM) 50 MG tablet Take 1 tablet (50 mg total) by mouth every 12 (twelve) hours as needed. 60 tablet 0  . venetoclax (VENCLEXTA) 100 MG tablet TAKE 1 TABLET BY MOUTH ONCE DAILY 30 tablet 0   No current facility-administered medications for this visit.    PHYSICAL EXAMINATION: ECOG PERFORMANCE STATUS: 1 - Symptomatic but completely ambulatory  BP 122/75   Pulse 63   Temp 97.8 F (36.6 C) (Tympanic)   Resp 20   Wt 227 lb 9.6 oz (103.2 kg)   BMI 34.61 kg/m   Filed Weights   05/02/21 0934  Weight: 227 lb 9.6 oz (103.2 kg)    Physical Exam Constitutional:      Comments: Patient is alone.  HENT:     Head: Normocephalic and  atraumatic.     Mouth/Throat:     Pharynx: No oropharyngeal exudate.  Eyes:  Pupils: Pupils are equal, round, and reactive to light.  Cardiovascular:     Rate and Rhythm: Normal rate and regular rhythm.  Pulmonary:     Effort: No respiratory distress.     Breath sounds: No wheezing.  Abdominal:     General: Bowel sounds are normal. There is no distension.     Palpations: Abdomen is soft. There is no mass.     Tenderness: There is no abdominal tenderness. There is no guarding or rebound.  Musculoskeletal:        General: No tenderness. Normal range of motion.     Cervical back: Normal range of motion and neck supple.  Lymphadenopathy:     Comments: Resolved lymph nodes in the neck underarms.  Skin:    General: Skin is warm.  Neurological:     Mental Status: He is alert and oriented to person, place, and time.  Psychiatric:        Mood and Affect: Affect normal.    LABORATORY DATA:  I have reviewed the data as listed    Component Value Date/Time   NA 140 05/02/2021 0909   NA 143 03/11/2021 1136   NA 139 10/11/2014 1800   K 3.5 05/02/2021 0909   K 3.3 (L) 10/11/2014 1800   CL 105 05/02/2021 0909   CL 106 10/11/2014 1800   CO2 28 05/02/2021 0909   CO2 27 10/11/2014 1800   GLUCOSE 121 (H) 05/02/2021 0909   GLUCOSE 107 (H) 10/11/2014 1800   BUN 19 05/02/2021 0909   BUN 13 03/11/2021 1136   BUN 15 10/11/2014 1800   CREATININE 0.97 05/02/2021 0909   CREATININE 0.89 10/11/2014 1800   CALCIUM 8.6 (L) 05/02/2021 0909   CALCIUM 8.8 (L) 10/11/2014 1800   PROT 6.5 05/02/2021 0909   PROT 6.7 05/18/2017 1048   PROT 6.4 (L) 10/11/2014 1800   ALBUMIN 4.0 05/02/2021 0909   ALBUMIN 4.3 05/18/2017 1048   ALBUMIN 4.1 10/11/2014 1800   AST 22 05/02/2021 0909   AST 23 10/11/2014 1800   ALT 25 05/02/2021 0909   ALT 22 10/11/2014 1800   ALKPHOS 98 05/02/2021 0909   ALKPHOS 61 10/11/2014 1800   BILITOT 0.6 05/02/2021 0909   BILITOT 0.7 05/18/2017 1048   BILITOT 0.9 10/11/2014  1800   GFRNONAA >60 05/02/2021 0909   GFRNONAA >60 10/11/2014 1800   GFRAA >60 04/12/2020 0959   GFRAA >60 10/11/2014 1800    No results found for: SPEP, UPEP  Lab Results  Component Value Date   WBC 7.0 05/02/2021   NEUTROABS 4.1 05/02/2021   HGB 12.3 (L) 05/02/2021   HCT 36.4 (L) 05/02/2021   MCV 102.8 (H) 05/02/2021   PLT 143 (L) 05/02/2021      Chemistry      Component Value Date/Time   NA 140 05/02/2021 0909   NA 143 03/11/2021 1136   NA 139 10/11/2014 1800   K 3.5 05/02/2021 0909   K 3.3 (L) 10/11/2014 1800   CL 105 05/02/2021 0909   CL 106 10/11/2014 1800   CO2 28 05/02/2021 0909   CO2 27 10/11/2014 1800   BUN 19 05/02/2021 0909   BUN 13 03/11/2021 1136   BUN 15 10/11/2014 1800   CREATININE 0.97 05/02/2021 0909   CREATININE 0.89 10/11/2014 1800      Component Value Date/Time   CALCIUM 8.6 (L) 05/02/2021 0909   CALCIUM 8.8 (L) 10/11/2014 1800   ALKPHOS 98 05/02/2021 0909   ALKPHOS 61 10/11/2014 1800  AST 22 05/02/2021 0909   AST 23 10/11/2014 1800   ALT 25 05/02/2021 0909   ALT 22 10/11/2014 1800   BILITOT 0.6 05/02/2021 0909   BILITOT 0.7 05/18/2017 1048   BILITOT 0.9 10/11/2014 1800       RADIOGRAPHIC STUDIES: I have personally reviewed the radiological images as listed and agreed with the findings in the report. No results found.   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 MARCH 25th, 2022 CT C/A/P- NED- STABLE.   # currently on Venoclax [100 mg sec to diarrhea; DI with Amio];  Labs today reviewed;-STABLE. Repeat imaging in 1 month; CT scan ordered today.Marland Kitchen    #Diarrhea-G-1 ? venatolclax- monitor for now.STABLE.   #Low immunoglobulins IgG 330/recent severe respiratory infection/CLL-s/pI IVIG #4/4 [last April 2022].  Will order repeat quantitative immunoglobulins for next  visit./Ordered today.  # A.fib-on amiodarone [drug interaction-amiodarone; Dr.Gollan] sinus rhythm-on Eliquis/CHF- lasix 40/d- s/p Cath [AUG, 2022- Echo-55%] -STABLE.    # MIld Anemia Hb ~12.3 ; June 2022-ferritin 17 iron saturation 33% - -STABLE.    # Joint pains/ Muscle pain [improved after stopping statin; shot-ok]-sec to Ventoclax-G-1-2;? R continue tramadol/CBD prn- -STABLE.    # Vaccination:flu shot [oct, 4709]; awaiting covid booster #2.   DISPOSITION: # flu shot today # follow up in 4 weeks MD; labs- cbc/cmp/ldh-CT-CAP; - Dr.B      Orders Placed This Encounter  Procedures  . CT CHEST ABDOMEN PELVIS W CONTRAST    Standing Status:   Future    Standing Expiration Date:   05/02/2022    Order Specific Question:   Preferred imaging location?    Answer:   [facility]    Order Specific Question:   Radiology Contrast Protocol - do NOT remove file path    Answer:   [email or web address]  . Immunoglobulins, QN, A/E/G/M    Standing Status:   Future    Standing Expiration Date:   05/02/2022  . CBC with Differential    Standing Status:   Future    Standing Expiration Date:   05/02/2022  . Comprehensive metabolic panel    Standing Status:   Future    Standing Expiration Date:   05/02/2022  . Lactate dehydrogenase    Standing Status:   Future    Standing Expiration Date:   05/02/2022    All questions were answered. The patient knows to call the clinic with any problems, questions or concerns.      Cammie Sickle, MD 05/02/2021 5:52 PM

## 2021-05-02 NOTE — Assessment & Plan Note (Addendum)
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 MARCH 25th, 2022 CT C/A/P- NED- STABLE.   # currently on Venoclax [100 mg sec to diarrhea; DI with Amio];  Labs today reviewed;-STABLE. Repeat imaging in 1 month; CT scan ordered today.Marland Kitchen    #Diarrhea-G-1 ? venatolclax- monitor for now.STABLE.   #Low immunoglobulins IgG 330/recent severe respiratory infection/CLL-s/pI IVIG #4/4 [last April 2022].  Will order repeat quantitative immunoglobulins for next visit./Ordered today.  # A.fib-on amiodarone [drug interaction-amiodarone; Dr.Gollan] sinus rhythm-on Eliquis/CHF- lasix 40/d- s/p Cath [AUG, 2022- Echo-55%] -STABLE.    # MIld Anemia Hb ~12.3 ; June 2022-ferritin 17 iron saturation 33% - -STABLE.    # Joint pains/ Muscle pain [improved after stopping statin; shot-ok]-sec to Ventoclax-G-1-2;? R continue tramadol/CBD prn- -STABLE.    # Vaccination:flu shot [oct, 1007]; awaiting covid booster #2.   DISPOSITION: # flu shot today # follow up in 4 weeks MD; labs- cbc/cmp/ldh-CT-CAP; - Dr.B

## 2021-05-08 ENCOUNTER — Encounter: Payer: Self-pay | Admitting: Internal Medicine

## 2021-05-09 ENCOUNTER — Telehealth: Payer: Self-pay | Admitting: *Deleted

## 2021-05-09 ENCOUNTER — Telehealth: Payer: Self-pay | Admitting: Cardiovascular Disease

## 2021-05-09 ENCOUNTER — Other Ambulatory Visit: Payer: Self-pay | Admitting: Internal Medicine

## 2021-05-09 ENCOUNTER — Other Ambulatory Visit (HOSPITAL_COMMUNITY): Payer: Self-pay

## 2021-05-09 NOTE — Telephone Encounter (Signed)
Pt states that his ear was torn off in an accident and was later sewn back on and he is now needing to hold his Eliquis... please advise

## 2021-05-09 NOTE — Telephone Encounter (Signed)
Pt reports being tx at the [facility] for a complex  right ear laceration s/p fall. Pt fell in his barn- face down and hit his head. His ear was partially detached and had to have stitches. Pt was to start Augmentin twice daily x 1 weeks and use Ciprodex ear drops in the right ear. Pt is concerned about his Eliquis and whether his anticoagulation needs to be on hold. I asked patient to contact Dr. Rockey Situ to further discuss this. He is to follow-up with ENT tomorrow to further assess the wound/stitches. Pt stated that it took almost 4 hours to close the wound.  We received an incoming RF on pt's Venetoclex. Dr. B declined script at this time. Pt was instructed to hold the venetoclax until Dr. B sees him at the next visit on 11/10 (ct scan on 11/7). Pt very anxious at stopping the medication. He stated "this treatment is finally working for me. I rather not stop my oral chemo." I explained to the patient that the Venetoclax could also drop the blood counts- given the recent trauma, Dr. B advises to hold the medicine until the patient recovers from this trauma. Pt gave verbal understanding of the plan of care.

## 2021-05-09 NOTE — Telephone Encounter (Signed)
Patient also called his oncologist. Gloris Ham, RN     12:16 PM Note Pt reports being tx at the [facility] for a complex  right ear laceration s/p fall. Pt fell in his barn- face down and hit his head. His ear was partially detached and had to have stitches. Pt was to start Augmentin twice daily x 1 weeks and use Ciprodex ear drops in the right ear. Pt is concerned about his Eliquis and whether his anticoagulation needs to be on hold. I asked patient to contact Dr. Rockey Situ to further discuss this. He is to follow-up with ENT tomorrow to further assess the wound/stitches. Pt stated that it took almost 4 hours to close the wound.    Spoke with the patient. Patient sts that he does have a drain at the site. It is not actively bleeding. He has NOT held his Eliquis and he is currently taking as prescribed. He is scheduled to see the ENT at the [facility] tomorrow.  He was told to contact his Cardiologist for recommendation regarding holding Eliquis. Patient sent a mychary message with pictures yesterday afternoon. Patient sts that he will await Dr. Donivan Scull response and recommendation.

## 2021-05-10 ENCOUNTER — Encounter: Payer: Self-pay | Admitting: Internal Medicine

## 2021-05-10 NOTE — Progress Notes (Signed)
I called to check on patient given his recent fall/ear laceration. Spoke to patient regarding holding Ventoclax. Given the risk of infection.[name]

## 2021-05-15 ENCOUNTER — Ambulatory Visit: Payer: Medicare HMO

## 2021-05-16 ENCOUNTER — Other Ambulatory Visit (HOSPITAL_COMMUNITY): Payer: Self-pay

## 2021-05-27 ENCOUNTER — Ambulatory Visit
Admission: RE | Admit: 2021-05-27 | Discharge: 2021-05-27 | Disposition: A | Discharge status: Home / Self Care | Payer: Medicare HMO | Source: Ambulatory Visit | Attending: Internal Medicine | Admitting: Internal Medicine

## 2021-05-27 ENCOUNTER — Other Ambulatory Visit: Payer: Self-pay

## 2021-05-27 ENCOUNTER — Telehealth: Payer: Self-pay | Admitting: Cardiovascular Disease

## 2021-05-27 DIAGNOSIS — C9110 Chronic lymphocytic leukemia of B-cell type not having achieved remission: Secondary | ICD-10-CM | POA: Insufficient documentation

## 2021-05-27 IMAGING — CT CT CHEST-ABD-PELV W/ CM
3 of 5 series · 14 of 36 positions shown, 16 images · IV contrast (omnipaque)
Comparison: CT [DATE]

CLINICAL DATA: Assess treatment response. Chronic lymphocytic
leukemia.

EXAM:
CT CHEST, ABDOMEN, AND PELVIS WITH CONTRAST
TECHNIQUE: Multidetector CT imaging of the chest, abdomen and pelvis was
performed following the standard protocol during bolus
administration of intravenous contrast.
CONTRAST:  100mL OMNIPAQUE IOHEXOL 300 MG/ML  SOLN

[Series 2: axials cap 5.00 · axial · 0.86mm/px · z∈[-1566,-946]mm · 9 of 150 slices shown, 11 images]
[im 14/150  mediastinal]
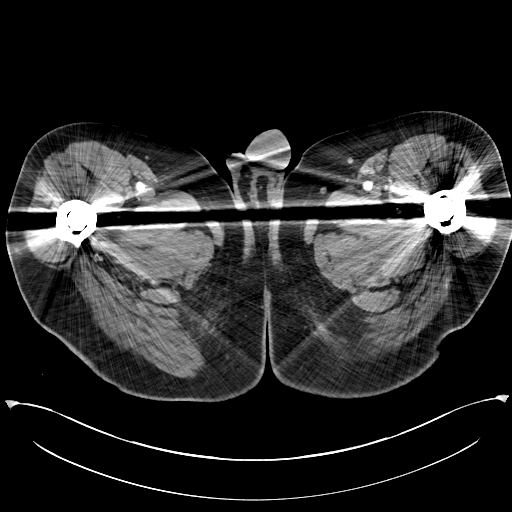
[im 14/150  bone]
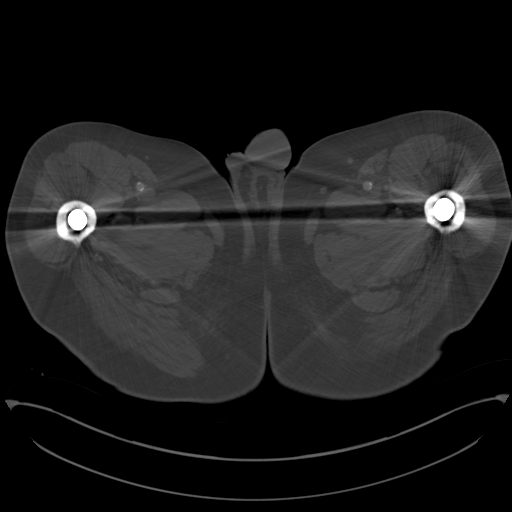
[im 41/150  mediastinal]
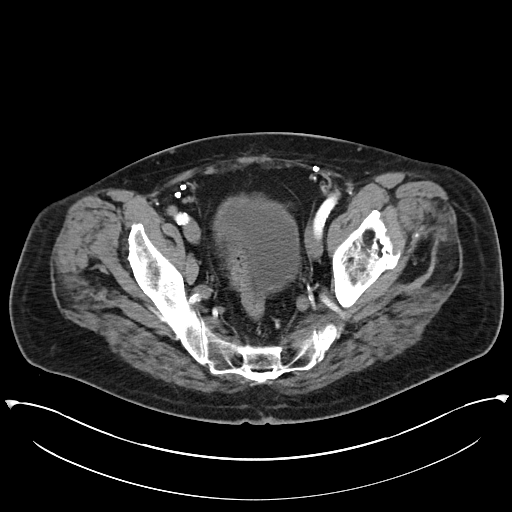
[im 55/150  mediastinal]
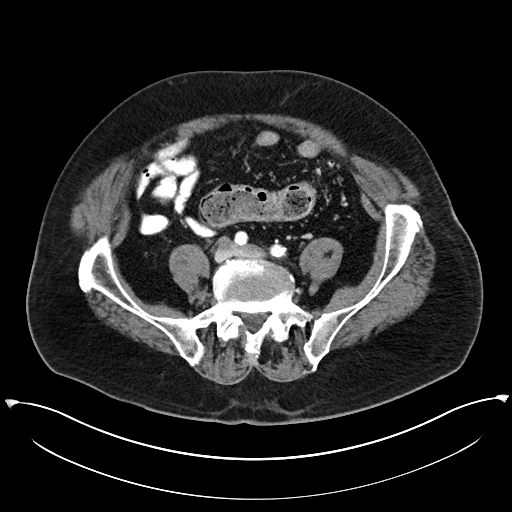
[im 68/150  mediastinal]
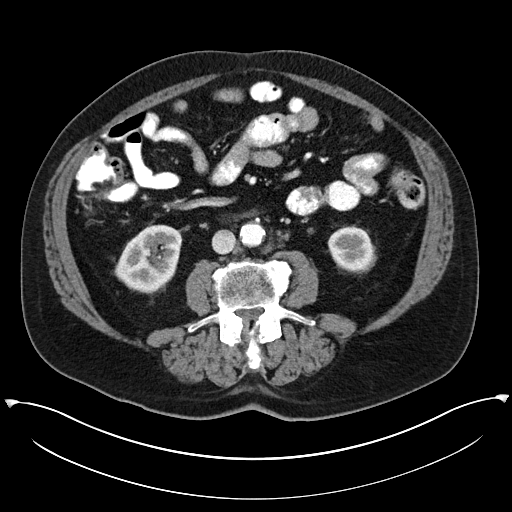
[im 82/150  mediastinal]
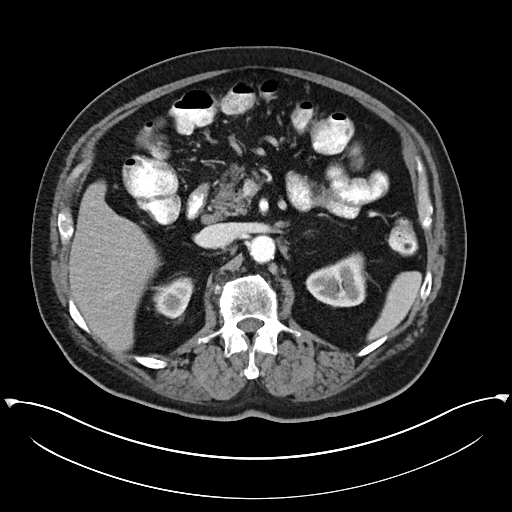
[im 95/150  mediastinal]
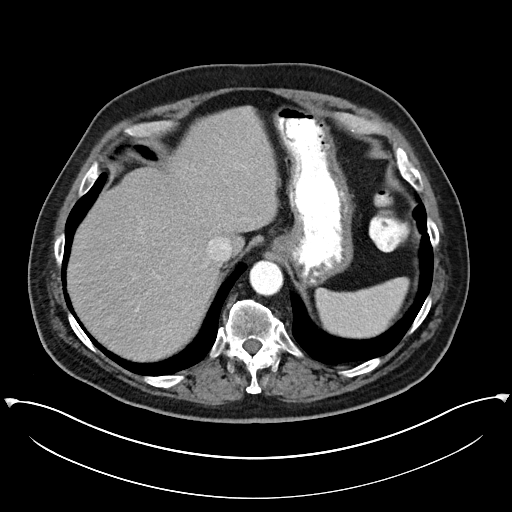
[im 109/150  mediastinal]
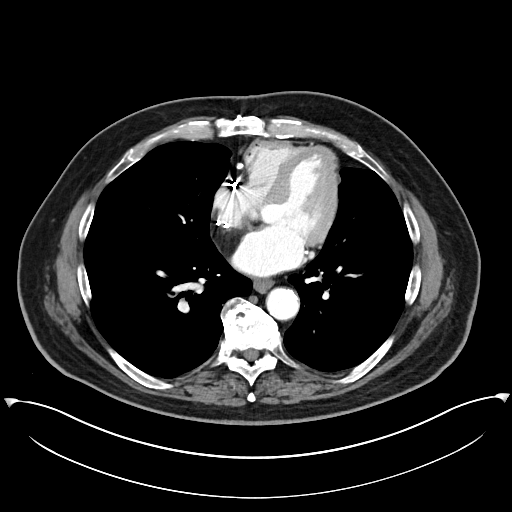
[im 122/150  mediastinal]
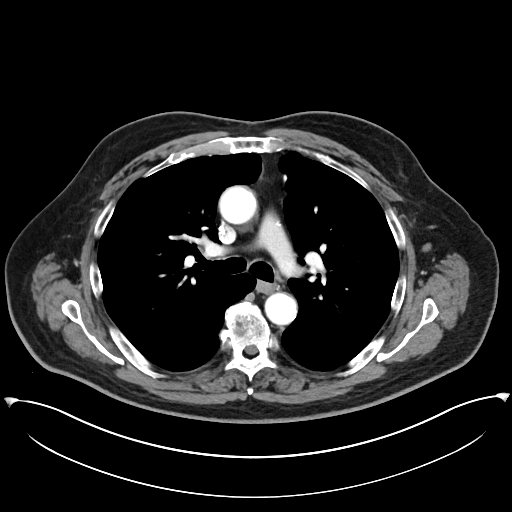
[im 136/150  mediastinal]
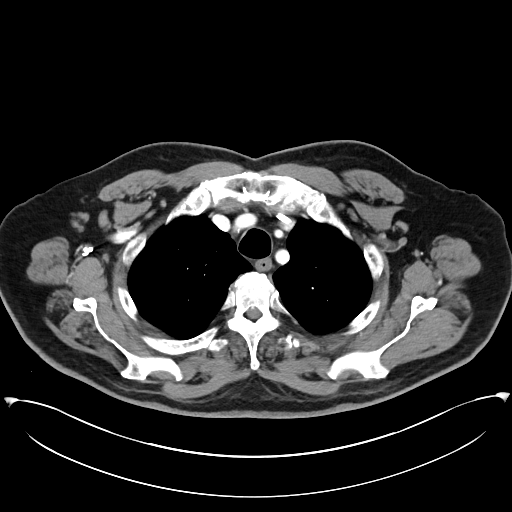
[im 136/150  bone]
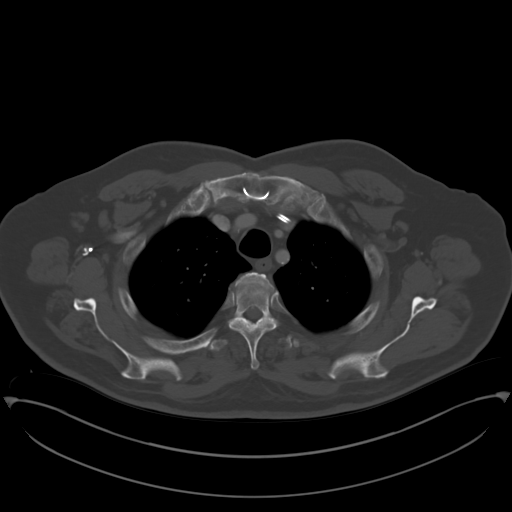

[Series 3: lungs cap 2.00 · axial · 0.72mm/px · z∈[-1163,-1111]mm · 2 of 158 slices shown]
[im 14/158  mediastinal]
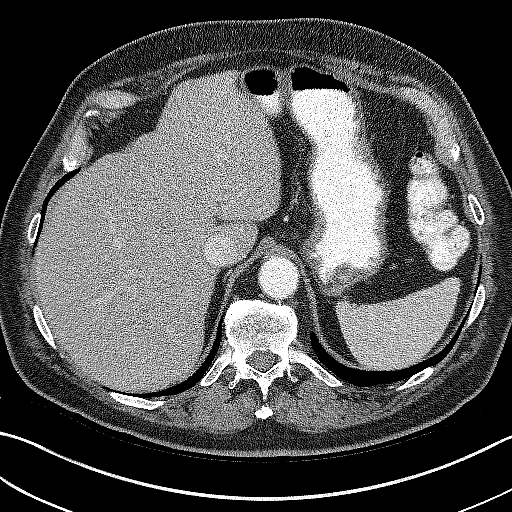
[im 40/158  mediastinal]
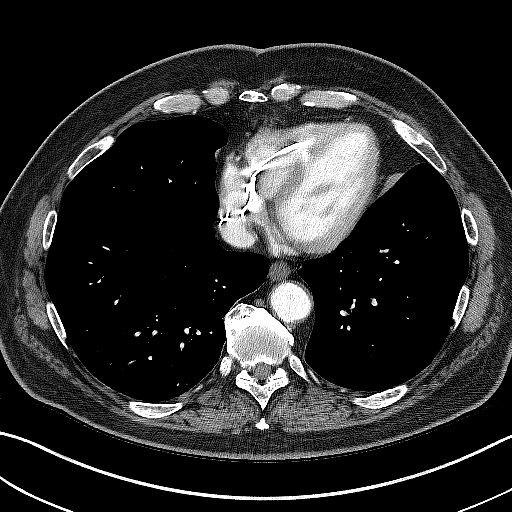

[Series 4: coronals cap 2.00 cor · coronal · 0.86mm/px · 3 of 167 slices shown]
[im 34/167  mediastinal]
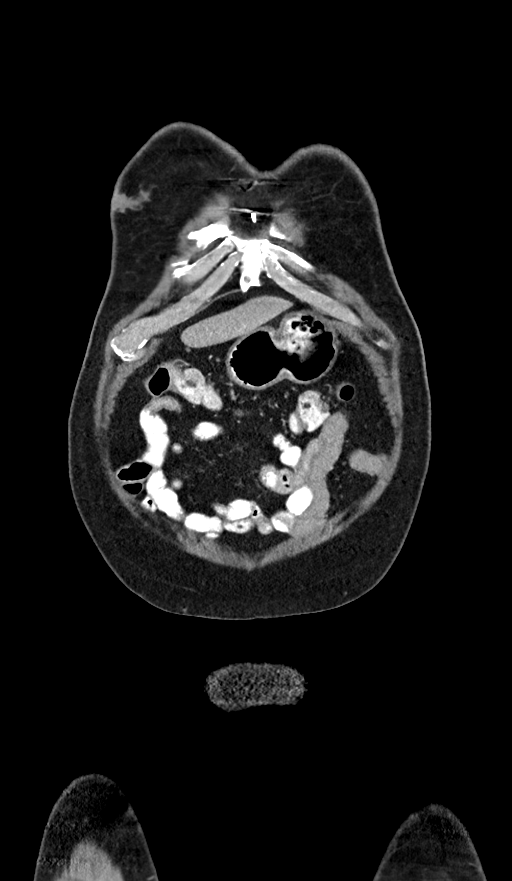
[im 67/167  mediastinal]
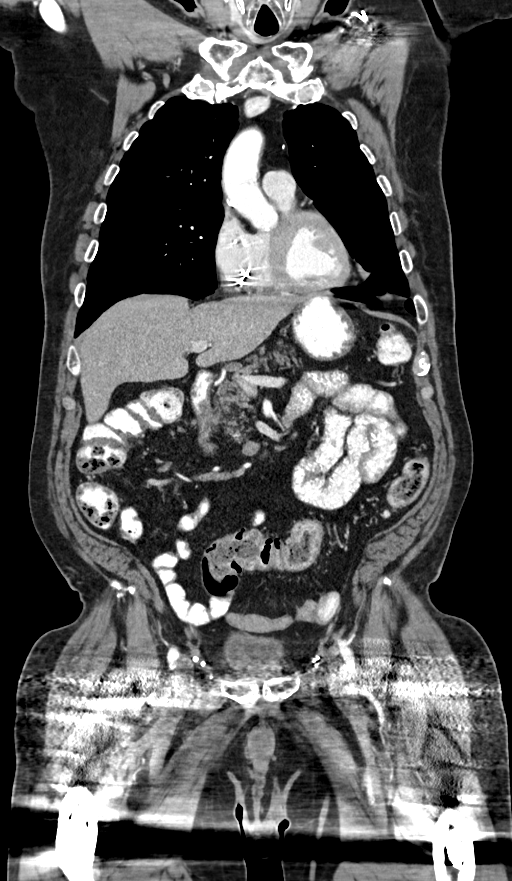
[im 100/167  mediastinal]
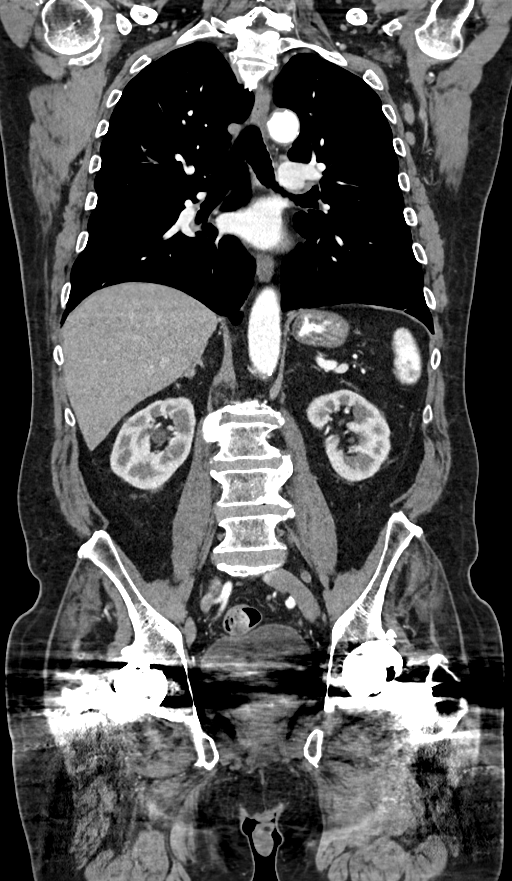

[14 of 36 positions shown; findings below may reference images not displayed]

FINDINGS: CT CHEST FINDINGS

Cardiovascular: Post CABG.  LEFT-sided pacer with RIGHT heart leads.

Mediastinum/Nodes: No supraclavicular adenopathy

Prominent axillary lymph nodes are not pathologically enlarged but
increased in short axis from comparison exam. For example LEFT
axillary node measuring 8 mm (image [DATE]) compares to 3 mm short
axis on prior.

RIGHT axillary node measuring 9 mm short axis (image [DATE]) compares
to 6 mm.

Lungs/Pleura: No suspicious pulmonary nodules. Normal pleural.
Airways normal.

Musculoskeletal: No aggressive osseous lesion.

CT ABDOMEN AND PELVIS FINDINGS

Hepatobiliary: No focal hepatic lesion. Postcholecystectomy. No
biliary dilatation.

Pancreas: Pancreas is normal. No ductal dilatation. No pancreatic
inflammation.

Spleen: Normal volume spleen.

Adrenals/urinary tract: Small RIGHT adrenal nodule measuring 10 mm
is unchanged. No renal lesion. Ureters and bladder normal.

Stomach/Bowel: Stomach, small bowel, appendix, and cecum are normal.
The colon and rectosigmoid colon are normal.

Vascular/Lymphatic: Calcification abdominal aorta. No aneurysm. LEFT
periaortic retroperitoneal node at the level the kidneys measures 12
mm not changed from prior.

RIGHT external iliac node measuring 9 mm (image 110/2) is increased
from 4 mm.

No inguinal adenopathy.

Again noted is significant streak artifact the prosthetics

Reproductive: Poorly evaluated due to streak artifact

Other: No free fluid.

Musculoskeletal: No aggressive osseous lesion
IMPRESSION: Chest Impression:

1. Bilateral axial lymph nodes at the upper limits normal are
increased in size from comparison CT.
2. No mediastinal adenopathy.
3. No pulmonary nodularity

Abdomen / Pelvis Impression:

1. Single RIGHT external iliac lymph node increased in volume.
2. Stable small periaortic lymph node.
3. No splenomegaly.
4. No skeletal metastasis

## 2021-05-27 MED ORDER — IOHEXOL 300 MG/ML  SOLN
100.0000 mL | Freq: Once | INTRAMUSCULAR | Status: AC | PRN
  Administered 2021-05-27: 100 mL via INTRAVENOUS

## 2021-05-27 NOTE — Telephone Encounter (Signed)
Patient would like to discuss his medications. States he only received 1 pill of his amiodarone. Please call to discuss.

## 2021-05-27 NOTE — Telephone Encounter (Signed)
Was able to return call to [name] who reports [facility] gave him a bottle with 1 tab of amiodarone that states "Take one tab daily" and with "0" refills.Advised pt that may be a wrong script or an old script in the system.  Advised he is suppose to be on amiodarone 100 mg BID, script sent in on 03/13/2021 with pharmacy verification.    amiodarone (PACERONE) 200 MG tablet 60 tablet 6 03/13/2021    Sig: Take 1/2 tablet (100 mg) by mouth twice daily 5 days a week   Sent to pharmacy as: amiodarone (PACERONE) 200 MG tablet   Notes to Pharmacy: Previous sig from 01/09/21 was incorrect- the patient is running out of tablets and needs his refill now- sig corrected as stated on this RX   E-Prescribing Status: Receipt confirmed by pharmacy (03/13/2021 12:51 PM EDT)     Suggest he go back up [facility] in [place] where he picked up his med and explain to them that they gave him the wrong script and one "1" tab in a bottle. Also, suggested a closer [facility] as he lives on [address] in [place] and there is a closer one in [place] and in [place]. Pt is thankful as he did not realize there was a closer store, stated if he cannot get his meds resolved at [facility], he will call to have pharmacy switch.  [name] thankful for the return call, reports will call back if he cannot get his mediation resolve at store.   Called [facility] [place], pharmacist verify they filled June script, they will delete script from system and fill the current script from August. Will prepare now as pt is on his way to pharmacy

## 2021-05-30 ENCOUNTER — Inpatient Hospital Stay (HOSPITAL_BASED_OUTPATIENT_CLINIC_OR_DEPARTMENT_OTHER): Payer: Medicare HMO | Admitting: Internal Medicine

## 2021-05-30 ENCOUNTER — Inpatient Hospital Stay: Payer: Medicare HMO | Attending: Internal Medicine

## 2021-05-30 ENCOUNTER — Other Ambulatory Visit (HOSPITAL_COMMUNITY): Payer: Self-pay

## 2021-05-30 ENCOUNTER — Other Ambulatory Visit: Payer: Self-pay

## 2021-05-30 ENCOUNTER — Encounter: Payer: Self-pay | Admitting: Internal Medicine

## 2021-05-30 DIAGNOSIS — C9110 Chronic lymphocytic leukemia of B-cell type not having achieved remission: Secondary | ICD-10-CM

## 2021-05-30 DIAGNOSIS — D649 Anemia, unspecified: Secondary | ICD-10-CM | POA: Diagnosis not present

## 2021-05-30 LAB — COMPREHENSIVE METABOLIC PANEL
ALT: 22 U/L (ref 0–44)
AST: 18 U/L (ref 15–41)
Albumin: 4.2 g/dL (ref 3.5–5.0)
Alkaline Phosphatase: 93 U/L (ref 38–126)
Anion gap: 7 (ref 5–15)
BUN: 20 mg/dL (ref 8–23)
CO2: 30 mmol/L (ref 22–32)
Calcium: 8.5 mg/dL — ABNORMAL LOW (ref 8.9–10.3)
Chloride: 105 mmol/L (ref 98–111)
Creatinine, Ser: 1.21 mg/dL (ref 0.61–1.24)
GFR, Estimated: >60 mL/min (ref >60)
Glucose, Bld: 137 mg/dL — ABNORMAL HIGH (ref 70–99)
Potassium: 3.9 mmol/L (ref 3.5–5.1)
Sodium: 142 mmol/L (ref 135–145)
Total Bilirubin: 0.6 mg/dL (ref 0.3–1.2)
Total Protein: 6.8 g/dL (ref 6.5–8.1)

## 2021-05-30 LAB — CBC WITH DIFFERENTIAL/PLATELET
Abs Immature Granulocytes: 0.03 10*3/uL (ref 0.00–0.07)
Basophils Absolute: 0.1 10*3/uL (ref 0.0–0.1)
Basophils Relative: 1 %
Eosinophils Absolute: 0.5 10*3/uL (ref 0.0–0.5)
Eosinophils Relative: 7 %
HCT: 35.7 % — ABNORMAL LOW (ref 39.0–52.0)
Hemoglobin: 11.9 g/dL — ABNORMAL LOW (ref 13.0–17.0)
Immature Granulocytes: 0 %
Lymphocytes Relative: 33 %
Lymphs Abs: 2.3 10*3/uL (ref 0.7–4.0)
MCH: 34.3 pg — ABNORMAL HIGH (ref 26.0–34.0)
MCHC: 33.3 g/dL (ref 30.0–36.0)
MCV: 102.9 fL — ABNORMAL HIGH (ref 80.0–100.0)
Monocytes Absolute: 0.7 10*3/uL (ref 0.1–1.0)
Monocytes Relative: 10 %
Neutro Abs: 3.4 10*3/uL (ref 1.7–7.7)
Neutrophils Relative %: 49 %
Platelets: 148 10*3/uL — ABNORMAL LOW (ref 150–400)
RBC: 3.47 MIL/uL — ABNORMAL LOW (ref 4.22–5.81)
RDW: 14 % (ref 11.5–15.5)
WBC: 7 10*3/uL (ref 4.0–10.5)
nRBC: 0 % (ref 0.0–0.2)

## 2021-05-30 LAB — LACTATE DEHYDROGENASE: LDH: 150 U/L (ref 98–192)

## 2021-05-30 MED ORDER — VENETOCLAX 100 MG PO TABS
ORAL_TABLET | Freq: Every day | ORAL | 0 refills | Status: DC
  Filled 2021-05-30: qty 30, 30d supply, fill #0

## 2021-05-30 MED ORDER — VENETOCLAX 100 MG PO TABS: ORAL_TABLET | Freq: Every day | ORAL | 0 refills | Status: DC

## 2021-05-30 NOTE — Assessment & Plan Note (Addendum)
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 NOV 7th, 2022- 1. Bilateral axial lymph nodes at the upper limits normal increased in size from comparison CT; Slight increased in pelvic LN. Monitor for now.   # Currently on Venoclax [100 mg sec to diarrhea; DI with Amio];  Labs today reviewed- STABLE.   #Diarrhea-G-1 ? venatolclax- monitor for now.STABLE.   # Low immunoglobulins IgG 330/recent severe respiratory infection/CLL-s/pI IVIG #4/4 [last April 2022]. Awaiting immunoglobulins   # A.fib-on amiodarone [drug interaction-amiodarone; Dr.Gollan] sinus rhythm-on Eliquis/CHF- lasix 40/d- s/p Cath [AUG, 2022- Echo-55%] -STABLE.    # MIld Anemia Hb ~11-12; June 2022-ferritin 17 iron saturation 33% -STABLE.   # Joint pains/ Muscle pain [improved after stopping statin; shot-ok]-sec to Ventoclax-G-1-2;? R continue tramadol/CBD prn- -STABLE.    # S/p Fall mechanical-right ear laceration- improved/   DISPOSITION: # follow up in 2 months  MD; labs- cbc/cmp/ldh- - Dr.B  # I reviewed the blood work- with the patient in detail; also reviewed the imaging independently [as summarized above]; and with the patient in detail.

## 2021-05-31 ENCOUNTER — Other Ambulatory Visit: Payer: Self-pay | Admitting: *Deleted

## 2021-05-31 DIAGNOSIS — C9110 Chronic lymphocytic leukemia of B-cell type not having achieved remission: Secondary | ICD-10-CM

## 2021-05-31 MED ORDER — ACYCLOVIR 400 MG PO TABS: ORAL_TABLET | ORAL | 6 refills | Status: DC

## 2021-06-06 LAB — IMMUNOGLOBULINS A/E/G/M, SERUM
IgA: 25 mg/dL — ABNORMAL LOW (ref 61–437)
IgE (Immunoglobulin E), Serum: 57 IU/mL (ref 6–495)
IgG (Immunoglobin G), Serum: 557 mg/dL — ABNORMAL LOW (ref 603–1613)
IgM (Immunoglobulin M), Srm: 10 mg/dL — ABNORMAL LOW (ref 15–143)

## 2021-06-12 ENCOUNTER — Other Ambulatory Visit: Payer: Self-pay

## 2021-06-12 ENCOUNTER — Ambulatory Visit: Payer: Medicare HMO | Admitting: Cardiovascular Disease

## 2021-06-12 ENCOUNTER — Encounter: Payer: Self-pay | Admitting: Cardiovascular Disease

## 2021-06-12 VITALS — BP 130/80 | HR 63 | Ht 69.0 in | Wt 223.0 lb

## 2021-06-12 DIAGNOSIS — I5032 Chronic diastolic (congestive) heart failure: Secondary | ICD-10-CM

## 2021-06-12 DIAGNOSIS — E782 Mixed hyperlipidemia: Secondary | ICD-10-CM

## 2021-06-12 DIAGNOSIS — Z950 Presence of cardiac pacemaker: Secondary | ICD-10-CM

## 2021-06-12 DIAGNOSIS — I480 Paroxysmal atrial fibrillation: Secondary | ICD-10-CM

## 2021-06-12 DIAGNOSIS — I495 Sick sinus syndrome: Secondary | ICD-10-CM | POA: Diagnosis not present

## 2021-06-12 DIAGNOSIS — I739 Peripheral vascular disease, unspecified: Secondary | ICD-10-CM

## 2021-06-12 DIAGNOSIS — I10 Essential (primary) hypertension: Secondary | ICD-10-CM

## 2021-06-12 MED ORDER — FUROSEMIDE 20 MG PO TABS: 40.0000 mg | ORAL_TABLET | Freq: Every day | ORAL | 3 refills | Status: DC

## 2021-06-12 NOTE — Patient Instructions (Addendum)
Medication Instructions:  Please STOP imdur/isosorbide  Please HOLD lasix once a week  Do not take on Thursday Non tomorrow 11/24  On days with terrible diarrhea You can skip the lasix You could take imodium (OTC medication)  If you need a refill on your cardiac medications before your next appointment, please call your pharmacy.   Lab work: No new labs needed  Testing/Procedures: No new testing needed  Follow-Up: At [facility], you and your health needs are our priority.  As part of our continuing mission to provide you with exceptional heart care, we have created designated Provider Care Teams.  These Care Teams include your primary Cardiologist (physician) and Advanced Practice Providers (APPs -  Physician Assistants and Nurse Practitioners) who all work together to provide you with the care you need, when you need it.  You will need a follow up appointment in 6 months  Providers on your designated Care Team:   Murray Hodgkins, NP Christell Faith, PA-C Cadence Kathlen Mody, [place]  COVID-19 Vaccine Information can be found at: [email or web address] For questions related to vaccine distribution or appointments, please email [email or web address] or call 5024656404.

## 2021-06-12 NOTE — Progress Notes (Signed)
. Cardiology Office Note  Date:  06/12/2021   ID:  ARICK MARENO, DOB 1945-07-25, MRN [id number]  PCP:  Leone Haven, MD   Chief Complaint  Patient presents with   3 month follow up     Patient c/o shortness of breath & dizziness when changing positions. Medications reviewed by the patient verbally.     HPI:  Mr. Neuman is a 75 year old gentleman with a history of  smoking, coronary artery disease,  cabg,  catheterization March 2011 with occluded vein graft 2, patent LIMA, collaterals from left to right,  atrial fibrillation March 2013, 10/11/2014. Converted to normal with metoprolol,  Possible conversion pause. 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  Last seen in clinic by myself June 2022  [place], he reports mechanical fall, landed on cement, ear trauma on the right, almost tore his ear off, "barely hanging on".  Pictures reviewed, major laceration noted Wife drove him to the [place] Needed urgent surgery at the [place], they sewed for "4 hours"  Weight stable, Having orthostasis sx  Orthostatics positive today  Supine 129/76, rate 62 Sitting: 123/70, rate 63 bpm Standing: 106/62, rate 65 Standing 3 min: 99/61, rate 60 bpm  Having diarrhea spells, attributes this to cancer medication  Taking amiodarone to 5 days a week i.e. 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  Ectatic abdominal aorta at risk for aneurysm development.    Echocardiogram 02/04/20 EF 55 to 60%, no RWMA, mild LVH, grade 1  diastolic dysfunction, RV normal size and function, mildly elevated PASP with RV systolic pressure 78.6 mmHg, mild MR, mild to moderate aortic valve sclerosis without stenosis.    Chronic leg pain, now on tramadol BID "from cancer medication"  Reports long history of bad muscle pain in thighs, No better with dexamethasone constant day and night,   fell off a ladder beginning of October 2017 CT [facility]: 04/25/2016 showing large hematoma Right gluteal intramuscular hematoma  measuring 9.6 x 6.8 x 4.9 cm [facility] stopped eliquis, statin (unclear why they stopped his statin)  Working with the [facility] for PTSD, reports feeling well, stable Reports that he is been compliant with his Lasix, taking 80 mg daily which is higher dose than before for the past 5 days, no improvement in his leg swelling   atrial fibrillation/flutter starting September 12 2015, then persistent flutter In the clinic his heart rate was 126 bpm Amiodarone dosing was increased, amlodipine was changed to diltiazem 120 mg daily, metoprolol increased up to 25 mill grams twice a day Converted to normal sinus rhythm at home    tachycardia concerning for atrial fibrillation 05/10/2015. Lasted only several minutes Rate possibly up to 140 bpm.   Previously has been very active, building a barn, tool shed, taking care of animals  some benefits from the [place] for agent orange exposure and his cancer   Previous catheterization showing occluded LAD in the mid vessel, 60% left main, 99% distal RCA who was sent to [facility] in October 2010 for bypass surgery. He received 3  vessel bypass by Dr.Gerhardt with a LIMA to the LAD, vein graft to the OM1 and vein graft to the PDA, with subsequent cardiac catheter March 2011 for chest discomfort showing occluded vein grafts x2 with patent LIMA. He has collateral vessels from left to right. Ejection fraction 55% mild anterolateral hypokinesis.  PMH:   has a past medical history of Anxiety, Arthritis,  Atrial fibrillation ([place]), Cancer associated pain, Chicken pox, Chronic lymphocytic leukemia ([place]), CLL (chronic lymphocytic leukemia) ([place]), Complication of anesthesia, COPD (chronic obstructive pulmonary disease) ([place]), Coronary artery disease, Dysrhythmia, GERD (gastroesophageal reflux disease), History of chemotherapy (2015-2016), HOH (hard of hearing), Hypertension, Myocardial infarction ([place]) (2010), OSA on CPAP, Presence of permanent cardiac pacemaker (2017), PTSD (post-traumatic stress disorder), PTSD (post-traumatic stress disorder), Pure hypercholesterolemia, Rheumatic fever (1959), 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  PSH:    Past Surgical History:  Procedure Laterality Date   ABDOMINAL HERNIA REPAIR     APPENDECTOMY  06/21/1985   CARDIAC CATHETERIZATION  2010; 2011   ; Dr Fletcher Anon   CORONARY ARTERY BYPASS GRAFT  04/2009   "CABG X3"   EP IMPLANTABLE DEVICE N/A 03/03/2016   Procedure: Pacemaker Implant;  Surgeon: Deboraha Sprang, MD;  Location: [facility];  Service: Cardiovascular;  Laterality: N/A;   FOREIGN BODY REMOVAL  1968   "shrapnel in my tailbone"   INGUINAL HERNIA REPAIR Right    INSERT / REPLACE / [place] Right 2018   LAPAROSCOPIC CHOLECYSTECTOMY     RIGHT/LEFT HEART CATH AND CORONARY/GRAFT ANGIOGRAPHY N/A 02/04/2021   Procedure: RIGHT/LEFT HEART CATH AND CORONARY/GRAFT ANGIOGRAPHY;  Surgeon: Nelva Bush, MD;  Location: [facility];  Service: Cardiovascular;  Laterality: N/A;   TONSILLECTOMY AND ADENOIDECTOMY  1956   TOTAL HIP ARTHROPLASTY Right 10/22/2016   Procedure: TOTAL HIP ARTHROPLASTY;  Surgeon: Dereck Leep, MD;  Location: [facility];  Service: Orthopedics;  Laterality: Right;   TOTAL HIP ARTHROPLASTY Left 11/04/2017   Procedure: TOTAL HIP ARTHROPLASTY;  Surgeon: Dereck Leep, MD;  Location: [facility];  Service: Orthopedics;  Laterality: Left;     Current Outpatient Medications  Medication Sig Dispense Refill   acetaminophen (TYLENOL) 500 MG tablet Take 1,000 mg by mouth every 8 (eight) hours as needed for mild pain.     acyclovir (ZOVIRAX) 400 MG tablet TAKE 1 TABLET(400 MG) BY MOUTH TWICE DAILY 60 tablet 6   albuterol (PROVENTIL HFA;VENTOLIN HFA) 108 (90 Base) MCG/ACT inhaler Inhale 2 puffs into the lungs every 6 (six) hours as needed for wheezing or shortness of breath. 1 Inhaler 11   Alirocumab (PRALUENT) 150 MG/ML SOAJ Inject 150 mg into the skin every 14 (fourteen) days. 2 mL 12   amiodarone (PACERONE) 200 MG tablet Take 1/2 tablet (100 mg) by mouth twice daily 5 days a week 60 tablet 6   apixaban (ELIQUIS) 5 MG TABS tablet Take 5 mg by mouth 2 (two) times daily.     atorvastatin (LIPITOR) 80 MG tablet TAKE ONE TABLET BY MOUTH AT BEDTIME FOR CHOLESTEROL     azelastine (ASTELIN) 0.1 % nasal spray Place 2 sprays into both nostrils 2 (two) times daily. Use in each nostril as directed 30 mL 3   budesonide-formoterol (SYMBICORT) 160-4.5 MCG/ACT inhaler Inhale 2 puffs into the lungs 2 (two) times daily. 1 Inhaler 11   cetirizine (ZYRTEC) 10 MG tablet Take 10 mg by mouth daily as needed for allergies.  Coenzyme Q10 (COQ10) 200 MG CAPS Take 200 mg by mouth daily.     ezetimibe (ZETIA) 10 MG tablet Take 1 tablet (10 mg total) by mouth daily. 90 tablet 3   fluticasone-salmeterol (ADVAIR) 100-50 MCG/ACT AEPB Inhale 1 puff into the lungs 2 (two) times daily.     furosemide (LASIX) 20 MG tablet Take 2 tablets (40 mg total) by mouth daily. 180 tablet 3   isosorbide mononitrate (IMDUR) 30 MG 24 hr tablet Take 1 tablet (30 mg total) by mouth daily. 30 tablet 11   Krill Oil 350 MG CAPS Take 350 mg by mouth daily as needed (Heart).     metoprolol tartrate (LOPRESSOR) 50 MG tablet Take 1 tablet (50 mg total) by mouth 2 (two) times daily. 180 tablet 3   mirtazapine (REMERON) 15 MG tablet Take 15 mg by mouth at bedtime as needed (for panic  associated with PTSD).      Multiple Vitamin (MULTIVITAMIN WITH MINERALS) TABS tablet Take 1 tablet by mouth daily.     nitroGLYCERIN (NITROSTAT) 0.4 MG SL tablet Place 1 tablet (0.4 mg total) under the tongue every 5 (five) minutes as needed for chest pain. 25 tablet 6   Tiotropium Bromide Monohydrate 2.5 MCG/ACT AERS Inhale 2 puffs into the lungs at bedtime. Spiriva     traMADol (ULTRAM) 50 MG tablet Take 1 tablet (50 mg total) by mouth every 12 (twelve) hours as needed. 60 tablet 0   venetoclax (VENCLEXTA) 100 MG tablet TAKE 1 TABLET BY MOUTH ONCE DAILY 30 tablet 0   No current facility-administered medications for this visit.     Allergies:   Patient has no known allergies.   Social History:  The patient  reports that he quit smoking about 14 years ago. His smoking use included cigarettes. He has a 40.00 pack-year smoking history. He has never used smokeless tobacco. He reports that he does not currently use alcohol. He reports that he does not use drugs.   Family History:   family history includes Coronary artery disease in an other family member; Heart attack in his mother; Heart disease in his mother.    Review of Systems: Review of Systems  Constitutional: Negative.   HENT: Negative.    Respiratory: Negative.    Cardiovascular: Negative.   Gastrointestinal: Negative.   Musculoskeletal: Negative.   Neurological:  Positive for dizziness.       Fall  Psychiatric/Behavioral: Negative.    All other systems reviewed and are negative.  PHYSICAL EXAM: VS:  BP 130/80 (BP Location: Left Arm, Patient Position: Sitting, Cuff Size: Normal)   Pulse 63   Ht 5\' 9"  (1.753 m)   Wt 223 lb (101.2 kg)   SpO2 98%   BMI 32.93 kg/m  , BMI Body mass index is 32.93 kg/m. Constitutional:  oriented to person, place, and time. No distress.  HENT:  Head: Grossly normal Eyes:  no discharge. No scleral icterus.  Neck: No JVD, no carotid bruits  Cardiovascular: Regular rate and rhythm, no murmurs  appreciated Pulmonary/Chest: Clear to auscultation bilaterally, no wheezes or rails Abdominal: Soft.  no distension.  no tenderness.  Musculoskeletal: Normal range of motion Neurological:  normal muscle tone. Coordination normal. No atrophy Skin: Skin warm and dry Psychiatric: normal affect, pleasant  Recent Labs: 08/16/2020: TSH 3.125 05/30/2021: ALT 22; BUN 20; Creatinine, Ser 1.21; Hemoglobin 11.9; Platelets 148; Potassium 3.9; Sodium 142    Lipid Panel Lab Results  Component Value Date   CHOL 111 02/04/2020  HDL 36 (L) 02/04/2020   LDLCALC 56 02/04/2020   TRIG 95 02/04/2020      Wt Readings from Last 3 Encounters:  06/12/21 223 lb (101.2 kg)  05/30/21 222 lb 3.2 oz (100.8 kg)  05/02/21 227 lb 9.6 oz (103.2 kg)     ASSESSMENT AND PLAN: Atrial fibrillation, unspecified type (HCC)  Maintaining normal sinus rhythm, continue amiodarone, metoprolol  On eliquis, no bleeding stable  HYPERCHOLESTEROLEMIA Currently on zetia , lipitor Cholesterol is at goal on the current lipid regimen. No changes to the medications were made.  Essential hypertension Orthostatic today Will hold imdur, Skip lasix once a week.  Also hold Lasix when he has diarrhea spells, likely the culprit for some of his low pressures/orthostasis Monitor blood pressure at home  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 with angina pectoris with documented spasm [facility]) Currently with no symptoms of angina. No further workup at this time. Continue current medication regimen.  Chronic diastolic CHF (congestive heart failure) (HCC) On lasix 40 daily, looks dry, orthostatic in setting in diarrhea from cancer meds Will hold imdur, stop lasix once a week, take 6 days a week  Centrilobular emphysema (HCC) Stable, SOB  Sinus pause  pacemaker placement, reports having occasional dropout or palpitation Pacer downloads reviewed, no significant arrhythmia  Leg pain No extremity Doppler  fine, ABIs normal On tramadol stable   Total encounter time more than 25 minutes  Greater than 50% was spent in counseling and coordination of care with the patient   No orders of the defined types were placed in this encounter.    Signed, Esmond Plants, M.D., Ph.D. 06/12/2021  [place], [place]

## 2021-06-19 ENCOUNTER — Ambulatory Visit (INDEPENDENT_AMBULATORY_CARE_PROVIDER_SITE_OTHER): Payer: Medicare HMO

## 2021-06-19 DIAGNOSIS — I480 Paroxysmal atrial fibrillation: Secondary | ICD-10-CM

## 2021-06-19 LAB — CUP PACEART REMOTE DEVICE CHECK
Battery Remaining Longevity: 52 mo
Battery Voltage: 2.99 V
Brady Statistic AP VP Percent: 1 %
Brady Statistic AP VS Percent: 98.13 %
Brady Statistic AS VP Percent: 0 %
Brady Statistic AS VS Percent: 0.87 %
Brady Statistic RA Percent Paced: 99.13 %
Brady Statistic RV Percent Paced: 1.04 %
Date Time Interrogation Session: 20221130141059
Implantable Lead Implant Date: 20170814
Implantable Lead Implant Date: 20170814
Implantable Lead Location: 753859
Implantable Lead Location: 753860
Implantable Lead Model: 5076
Implantable Lead Model: 5076
Implantable Pulse Generator Implant Date: 20170814
Lead Channel Impedance Value: 342 Ohm
Lead Channel Impedance Value: 456 Ohm
Lead Channel Impedance Value: 494 Ohm
Lead Channel Impedance Value: 570 Ohm
Lead Channel Pacing Threshold Amplitude: 0.75 V
Lead Channel Pacing Threshold Amplitude: 0.75 V
Lead Channel Pacing Threshold Pulse Width: 0.4 ms
Lead Channel Pacing Threshold Pulse Width: 0.4 ms
Lead Channel Sensing Intrinsic Amplitude: 22.125 mV
Lead Channel Sensing Intrinsic Amplitude: 22.125 mV
Lead Channel Sensing Intrinsic Amplitude: 3.125 mV
Lead Channel Sensing Intrinsic Amplitude: 3.125 mV
Lead Channel Setting Pacing Amplitude: 2 V
Lead Channel Setting Pacing Amplitude: 2.5 V
Lead Channel Setting Pacing Pulse Width: 0.4 ms
Lead Channel Setting Sensing Sensitivity: 0.9 mV

## 2021-06-21 ENCOUNTER — Telehealth: Payer: Self-pay | Admitting: Cardiovascular Disease

## 2021-06-21 ENCOUNTER — Encounter: Payer: Self-pay | Admitting: Internal Medicine

## 2021-06-21 NOTE — Telephone Encounter (Signed)
Patient is having a hard time getting his Praluent refilled The pharmacy states it is on back order Please call to discuss options

## 2021-06-21 NOTE — Progress Notes (Signed)
[facility] OFFICE PROGRESS NOTE  Patient Care Team: Michael Haven, MD as PCP - General (Family Medicine) Michael Situ Kathlene November, MD as PCP - Cardiology (Cardiology) Michael Merritts, MD as Consulting Physician (Cardiology) Michael Sickle, MD as Medical Oncologist (Hematology and Oncology)   Cancer Staging  No matching staging information was found for the patient.   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  MARCH 2017- CT N/C/A/P- NED. STOP Ritux; surveillance   # AUG 2019- CT/PET- progression/NO transformation;  # NOV 2019- Progression; started Ibrutinib 420 mg/d. STOPPED in end of feb sec to extreme fatigue/joint pains/cramps  #November 01, 2018-start ibrutinib 280 mg a day; December 20, 2018-discontinue ibrutinib secondary multiple side effects.   #January 03, 2019 start Gazyva + Ven [July]; finished Dec 2020-; started VLawson Fiscalmaintenance [200 mg a day; DI-amiodarone]; SEP 2022- congestive heart failure; s/p cardiac cath-noted to have CAD however no stents placed.  2D echo- EF-55%  #Mid March 2021-venetoclax dose reduced to 100 mg [second diarrhea].   #? 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       INTERVAL HISTORY: Alone.  Ambulating with a cane.   Michael [id number].[id number].  male CLL-high risk currently on venetoclax is here for follow-up/review results of the CT scan.  The interim patient of fall-mechanical hurt his ear.  S/p sutures.  Continues to have chronic joint pains not any worse.  Mild loose stools 1-2 a day.  No nausea no vomiting.  No headache.  No weight loss.  No night sweats.  \Review of Systems  Constitutional:  Positive for malaise/fatigue. Negative for chills, diaphoresis and fever.  HENT:  Negative for nosebleeds and sore throat.   Eyes:  Negative for double vision.  Respiratory:  Negative for hemoptysis, shortness of breath and wheezing.   Cardiovascular:  Negative for chest pain, palpitations, orthopnea and leg swelling.  Gastrointestinal:  Positive for diarrhea. Negative for abdominal pain, blood in stool, constipation, heartburn, melena, nausea and vomiting.  Genitourinary:  Negative for dysuria, frequency and urgency.  Musculoskeletal:  Positive for back pain, joint pain and myalgias.  Skin: Negative.  Negative for itching and rash.  Neurological:  Negative for dizziness, tingling, focal weakness and headaches.  Psychiatric/Behavioral:  Negative for depression. The patient is not nervous/anxious and does not have insomnia.      PAST MEDICAL HISTORY :  Past Medical History:  Diagnosis Date   Anxiety    Arthritis    Atrial fibrillation ([facility]  a. Dx 2013, recurred 02/2014, CHA2DS2VASc = 3 -->placed on Eliquis;  b. 02/2014 Echo: EF 50-55%, mid and apical anterior septum and mid and apical inf septum are abnl, mild to mod Ao sclerosis w/o AS.   Cancer associated pain    Chicken  pox    Chronic lymphocytic leukemia ([facility])    a. Dx 02/2014.   CLL (chronic lymphocytic leukemia) (H[facility])    Complication of anesthesia    History of  PTSD--do not touch patient when waking up from surgery.   COPD (chronic obstructive pulmonary disease) (HCC)    Coronary artery disease    a. 04/2009 CABG x 3 (LIMA->LAD, VG->OM1, VG->PDA);  b. 09/2009 Cath: occluded VG x 2 w/ patent LIMA and L->R collats. EF 55%, mild antlat HK;  c. 10/2011 MV: EF 53%, no isch/infarct-->low risk.   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   TIA (transient ischemic attack) 11/02/2015    PAST SURGICAL HISTORY :   Past Surgical History:  Procedure Laterality Date   ABDOMINAL HERNIA REPAIR     APPENDECTOMY  06/21/1985   CARDIAC CATHETERIZATION  2010; 2011   ; Dr Fletcher Anon   CORONARY ARTERY BYPASS GRAFT  04/2009   "CABG X3"   EP IMPLANTABLE DEVICE N/A 03/03/2016   Procedure: Pacemaker Implant;  Surgeon: Deboraha Sprang, MD;  Location: [facility];  Service: Cardiovascular;  Laterality: N/A;   FOREIGN BODY REMOVAL  1968   "shrapnel in my tailbone"   INGUINAL HERNIA REPAIR Right    INSERT / REPLACE / [facility] Right 2018   LAPAROSCOPIC CHOLECYSTECTOMY     RIGHT/LEFT HEART CATH AND CORONARY/GRAFT ANGIOGRAPHY N/A 02/04/2021   Procedure: RIGHT/LEFT HEART CATH AND CORONARY/GRAFT ANGIOGRAPHY;  Surgeon: Nelva Bush, MD;  Location: [facility];  Service: Cardiovascular;  Laterality: N/A;   TONSILLECTOMY AND ADENOIDECTOMY  1956   TOTAL  HIP ARTHROPLASTY Right 10/22/2016   Procedure: TOTAL HIP ARTHROPLASTY;  Surgeon: Dereck Leep, MD;  Location: [facility];  Service: Orthopedics;  Laterality: Right;   TOTAL HIP ARTHROPLASTY Left 11/04/2017   Procedure: TOTAL HIP ARTHROPLASTY;  Surgeon: Dereck Leep, MD;  Location: [facility];  Service: Orthopedics;  Laterality: Left;    FAMILY HISTORY :   Family History  Problem Relation Age of Onset   Heart disease Mother    Heart attack Mother    Coronary artery disease Other        family history    SOCIAL HISTORY:   Social History   Tobacco Use   Smoking status: Former    Packs/day: 1.00    Years: 40.00    Pack years: 40.00    Types: Cigarettes    Quit date: 07/21/2006    Years since quitting: 14.9   Smokeless tobacco: Never  Vaping Use   Vaping Use: Former  Substance Use Topics   Alcohol use: Not Currently   Drug use: No  ALLERGIES:  has No Known Allergies.  MEDICATIONS:  Current Outpatient Medications  Medication Sig Dispense Refill   acetaminophen (TYLENOL) 500 MG tablet Take 1,000 mg by mouth every 8 (eight) hours as needed for mild pain.     albuterol (PROVENTIL HFA;VENTOLIN HFA) 108 (90 Base) MCG/ACT inhaler Inhale 2 puffs into the lungs every 6 (six) hours as needed for wheezing or shortness of breath. 1 Inhaler 11   Alirocumab (PRALUENT) 150 MG/ML SOAJ Inject 150 mg into the skin every 14 (fourteen) days. 2 mL 12   amiodarone (PACERONE) 200 MG tablet Take 1/2 tablet (100 mg) by mouth twice daily 5 days a week 60 tablet 6   apixaban (ELIQUIS) 5 MG TABS tablet Take 5 mg by mouth 2 (two) times daily.     atorvastatin (LIPITOR) 80 MG tablet TAKE ONE TABLET BY MOUTH AT BEDTIME FOR CHOLESTEROL     azelastine (ASTELIN) 0.1 % nasal spray Place 2 sprays into both nostrils 2 (two) times daily. Use in each nostril as directed 30 mL 3   budesonide-formoterol (SYMBICORT) 160-4.5 MCG/ACT inhaler Inhale 2 puffs into the lungs 2 (two) times daily. 1 Inhaler 11   cetirizine  (ZYRTEC) 10 MG tablet Take 10 mg by mouth daily as needed for allergies.      Coenzyme Q10 (COQ10) 200 MG CAPS Take 200 mg by mouth daily.     ezetimibe (ZETIA) 10 MG tablet Take 1 tablet (10 mg total) by mouth daily. 90 tablet 3   fluticasone-salmeterol (ADVAIR) 100-50 MCG/ACT AEPB Inhale 1 puff into the lungs 2 (two) times daily.     Krill Oil 350 MG CAPS Take 350 mg by mouth daily as needed (Heart).     metoprolol tartrate (LOPRESSOR) 50 MG tablet Take 1 tablet (50 mg total) by mouth 2 (two) times daily. 180 tablet 3   mirtazapine (REMERON) 15 MG tablet Take 15 mg by mouth at bedtime as needed (for panic associated with PTSD).      Multiple Vitamin (MULTIVITAMIN WITH MINERALS) TABS tablet Take 1 tablet by mouth daily.     nitroGLYCERIN (NITROSTAT) 0.4 MG SL tablet Place 1 tablet (0.4 mg total) under the tongue every 5 (five) minutes as needed for chest pain. 25 tablet 6   Tiotropium Bromide Monohydrate 2.5 MCG/ACT AERS Inhale 2 puffs into the lungs at bedtime. Spiriva     traMADol (ULTRAM) 50 MG tablet Take 1 tablet (50 mg total) by mouth every 12 (twelve) hours as needed. 60 tablet 0   acyclovir (ZOVIRAX) 400 MG tablet TAKE 1 TABLET(400 MG) BY MOUTH TWICE DAILY 60 tablet 6   furosemide (LASIX) 20 MG tablet Take 2 tablets (40 mg total) by mouth daily. Do not take on Thursdays 180 tablet 3   venetoclax (VENCLEXTA) 100 MG tablet TAKE 1 TABLET BY MOUTH ONCE DAILY 30 tablet 0   No current facility-administered medications for this visit.    PHYSICAL EXAMINATION: ECOG PERFORMANCE STATUS: 1 - Symptomatic but completely ambulatory  BP 93/60   Pulse 61   Temp (!) 96.9 F (36.1 C)   Resp 18   Wt 222 lb 3.2 oz (100.8 kg)   BMI 33.79 kg/m   Filed Weights   05/30/21 0927  Weight: 222 lb 3.2 oz (100.8 kg)    Physical Exam Constitutional:      Comments: Patient is alone.  HENT:     Head: Normocephalic and atraumatic.     Mouth/Throat:     Pharynx: No oropharyngeal exudate.  Eyes:  Pupils: Pupils are equal, round, and reactive to light.  Cardiovascular:     Rate and Rhythm: Normal rate and regular rhythm.  Pulmonary:     Effort: No respiratory distress.     Breath sounds: No wheezing.  Abdominal:     General: Bowel sounds are normal. There is no distension.     Palpations: Abdomen is soft. There is no mass.     Tenderness: There is no abdominal tenderness. There is no guarding or rebound.  Musculoskeletal:        General: No tenderness. Normal range of motion.     Cervical back: Normal range of motion and neck supple.  Lymphadenopathy:     Comments: Resolved lymph nodes in the neck underarms.  Skin:    General: Skin is warm.  Neurological:     Mental Status: He is alert and oriented to person, place, and time.  Psychiatric:        Mood and Affect: Affect normal.    LABORATORY DATA:  I have reviewed the data as listed    Component Value Date/Time   NA 142 05/30/2021 0849   NA 143 03/11/2021 1136   NA 139 10/11/2014 1800   K 3.9 05/30/2021 0849   K 3.3 (L) 10/11/2014 1800   CL 105 05/30/2021 0849   CL 106 10/11/2014 1800   CO2 30 05/30/2021 0849   CO2 27 10/11/2014 1800   GLUCOSE 137 (H) 05/30/2021 0849   GLUCOSE 107 (H) 10/11/2014 1800   BUN 20 05/30/2021 0849   BUN 13 03/11/2021 1136   BUN 15 10/11/2014 1800   CREATININE 1.21 05/30/2021 0849   CREATININE 0.89 10/11/2014 1800   CALCIUM 8.5 (L) 05/30/2021 0849   CALCIUM 8.8 (L) 10/11/2014 1800   PROT 6.8 05/30/2021 0849   PROT 6.7 05/18/2017 1048   PROT 6.4 (L) 10/11/2014 1800   ALBUMIN 4.2 05/30/2021 0849   ALBUMIN 4.3 05/18/2017 1048   ALBUMIN 4.1 10/11/2014 1800   AST 18 05/30/2021 0849   AST 23 10/11/2014 1800   ALT 22 05/30/2021 0849   ALT 22 10/11/2014 1800   ALKPHOS 93 05/30/2021 0849   ALKPHOS 61 10/11/2014 1800   BILITOT 0.6 05/30/2021 0849   BILITOT 0.7 05/18/2017 1048   BILITOT 0.9 10/11/2014 1800   GFRNONAA >60 05/30/2021 0849   GFRNONAA >60 10/11/2014 1800   GFRAA >60  04/12/2020 0959   GFRAA >60 10/11/2014 1800    No results found for: SPEP, UPEP  Lab Results  Component Value Date   WBC 7.0 05/30/2021   NEUTROABS 3.4 05/30/2021   HGB 11.9 (L) 05/30/2021   HCT 35.7 (L) 05/30/2021   MCV 102.9 (H) 05/30/2021   PLT 148 (L) 05/30/2021      Chemistry      Component Value Date/Time   NA 142 05/30/2021 0849   NA 143 03/11/2021 1136   NA 139 10/11/2014 1800   K 3.9 05/30/2021 0849   K 3.3 (L) 10/11/2014 1800   CL 105 05/30/2021 0849   CL 106 10/11/2014 1800   CO2 30 05/30/2021 0849   CO2 27 10/11/2014 1800   BUN 20 05/30/2021 0849   BUN 13 03/11/2021 1136   BUN 15 10/11/2014 1800   CREATININE 1.21 05/30/2021 0849   CREATININE 0.89 10/11/2014 1800      Component Value Date/Time   CALCIUM 8.5 (L) 05/30/2021 0849   CALCIUM 8.8 (L) 10/11/2014 1800   ALKPHOS 93 05/30/2021 0849   ALKPHOS 61 10/11/2014 1800  AST 18 05/30/2021 0849   AST 23 10/11/2014 1800   ALT 22 05/30/2021 0849   ALT 22 10/11/2014 1800   BILITOT 0.6 05/30/2021 0849   BILITOT 0.7 05/18/2017 1048   BILITOT 0.9 10/11/2014 1800       RADIOGRAPHIC STUDIES: I have personally reviewed the radiological images as listed and agreed with the findings in the report. No results found.   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 NOV 7th, 2022- 1. Bilateral axial lymph nodes at the upper limits normal increased in size from comparison CT; Slight increased in pelvic LN. Monitor for now.   # Currently on Venoclax [100 mg sec to diarrhea; DI with Amio];  Labs today reviewed- STABLE.   #Diarrhea-G-1 ? venatolclax- monitor for now.STABLE.   # Low immunoglobulins IgG 330/recent severe respiratory infection/CLL-s/pI IVIG #4/4 [last April 2022]. Awaiting immunoglobulins   # A.fib-on amiodarone [drug  interaction-amiodarone; Dr.Gollan] sinus rhythm-on Eliquis/CHF- lasix 40/d- s/p Cath [AUG, 2022- Echo-55%] -STABLE.    # MIld Anemia Hb ~11-12; June 2022-ferritin 17 iron saturation 33% -STABLE.   # Joint pains/ Muscle pain [improved after stopping statin; shot-ok]-sec to Ventoclax-G-1-2;? R continue tramadol/CBD prn- -STABLE.    # S/p Fall mechanical-right ear laceration- improved/   DISPOSITION: # follow up in 2 months  MD; labs- cbc/cmp/ldh- - Dr.B  # I reviewed the blood work- with the patient in detail; also reviewed the imaging independently [as summarized above]; and with the patient in detail.      Orders Placed This Encounter  Procedures   Comprehensive metabolic panel    Standing Status:   Future    Standing Expiration Date:   05/30/2022   CBC with Differential    Standing Status:   Future    Standing Expiration Date:   05/30/2022   Lactate dehydrogenase    Standing Status:   Future    Standing Expiration Date:   05/30/2022    All questions were answered. The patient knows to call the clinic with any problems, questions or concerns.      Michael Sickle, MD 06/21/2021 2:56 PM

## 2021-06-21 NOTE — Telephone Encounter (Signed)
I spoke with pt to discuss Praluent refill. Pt mentioned that he has had several issues getting this medication filled. He is currently going to [facility] in [place]. Pt mentioned medication is currently on Back order. I offered to send new Rx to a different pharmacy and he said that he's had several issues and has had to do this in the past and would like to discuss his options. Please advise.

## 2021-06-25 ENCOUNTER — Other Ambulatory Visit (HOSPITAL_COMMUNITY): Payer: Self-pay

## 2021-06-25 ENCOUNTER — Other Ambulatory Visit: Payer: Self-pay | Admitting: Internal Medicine

## 2021-06-25 MED ORDER — VENETOCLAX 100 MG PO TABS
ORAL_TABLET | Freq: Every day | ORAL | 0 refills | Status: DC
  Filled 2021-06-25: qty 30, 30d supply, fill #0

## 2021-06-25 NOTE — Telephone Encounter (Signed)
Pt is aware for the future if he has any issues and pt mentioned that he was able to get his medication.

## 2021-06-27 ENCOUNTER — Other Ambulatory Visit (HOSPITAL_COMMUNITY): Payer: Self-pay

## 2021-06-28 NOTE — Progress Notes (Signed)
Remote pacemaker transmission.   

## 2021-07-02 ENCOUNTER — Encounter: Payer: Self-pay | Admitting: Internal Medicine

## 2021-07-02 ENCOUNTER — Ambulatory Visit (INDEPENDENT_AMBULATORY_CARE_PROVIDER_SITE_OTHER): Payer: Medicare HMO | Admitting: Internal Medicine

## 2021-07-02 ENCOUNTER — Other Ambulatory Visit: Payer: Self-pay

## 2021-07-02 VITALS — BP 98/62 | HR 64 | Ht 69.0 in | Wt 222.0 lb

## 2021-07-02 DIAGNOSIS — Z79899 Other long term (current) drug therapy: Secondary | ICD-10-CM

## 2021-07-02 DIAGNOSIS — I10 Essential (primary) hypertension: Secondary | ICD-10-CM | POA: Diagnosis not present

## 2021-07-02 DIAGNOSIS — I495 Sick sinus syndrome: Secondary | ICD-10-CM | POA: Diagnosis not present

## 2021-07-02 DIAGNOSIS — Z950 Presence of cardiac pacemaker: Secondary | ICD-10-CM

## 2021-07-02 DIAGNOSIS — I480 Paroxysmal atrial fibrillation: Secondary | ICD-10-CM | POA: Diagnosis not present

## 2021-07-02 LAB — PACEMAKER DEVICE OBSERVATION

## 2021-07-02 NOTE — Patient Instructions (Signed)
Medication Instructions:  - Your physician has recommended you make the following change in your medication:   1) STOP metoprolol 50 as directed below: - take 0.5 tablet (25 mg) by mouth TWICE daily x 7 days, then - take 0.5 tablet (25 mg) by mouth ONCE daily x 7 days, then - STOP   *If you need a refill on your cardiac medications before your next appointment, please call your pharmacy*   Lab Work: - Your physician recommends that you have lab work today: TSH  If you have labs (blood work) drawn today and your tests are completely normal, you will receive your results only by: [place] (if you have [place]) OR A paper copy in the mail If you have any lab test that is abnormal or we need to change your treatment, we will call you to review the results.   Testing/Procedures: - none ordered   Follow-Up: At [facility], you and your health needs are our priority.  As part of our continuing mission to provide you with exceptional heart care, we have created designated Provider Care Teams.  These Care Teams include your primary Cardiologist (physician) and Advanced Practice Providers (APPs -  Physician Assistants and Nurse Practitioners) who all work together to provide you with the care you need, when you need it.  We recommend signing up for the patient portal called "MyChart".  Sign up information is provided on this After Visit Summary.  MyChart is used to connect with patients for Virtual Visits (Telemedicine).  Patients are able to view lab/test results, encounter notes, upcoming appointments, etc.  Non-urgent messages can be sent to your provider as well.   To learn more about what you can do with MyChart, go to [email or web address].    Your next appointment:   3 month(s)  The format for your next appointment:   In Person  Provider:   Virl Axe, MD    Other Instructions N/a

## 2021-07-02 NOTE — Progress Notes (Signed)
Patient Care Team: Leone Haven, MD as PCP - General (Family Medicine) Rockey Situ Kathlene November, MD as PCP - Cardiology (Cardiology) [name], MD as Consulting Physician (Cardiology) Cammie Sickle, MD as Medical Oncologist (Hematology and Oncology)   HPI  [name] is a 75 y.o. male Seen in follow-up for atrial fibrillation managed with ELIQUIS and amiodarone. He was seen in August 2017 with significant sinus bradycardia and underwent pacing Coronary artery disease with prior bypass surgery  At last visit 5/22 had complaints of dyspnea and unsteadiness. Amiodarone dose was decreased for the latter and he underwent stress testing (See Below)  Noted by Dr. Deidre Ala 11/22 to have orthostatic hypotension blood pressure 129-->> 99  Intercurrent fall.  Blood pressures at home typically 120s  The patient denies chest pain,  nocturnal dyspnea, orthopnea or peripheral edema.  There have been no palpitations.  Complains of shortness of breath and ongoing LH .    Patient denies symptoms of GI intolerance, sun sensitivity attributable to amiodarone.  Orthostasis as noted  Date Cr Hgb TSH LFTs  7/18 0.99 13.2 1.15(4/18) 29   6/19 1.08 11.8  15  1/20 0.96 11.8  22  8/20 0.7 11.0  17  1/22 0.79 12.2 3.125 21  11/22 1.21 11.9  22      DATE TEST EF   2/17    Myoview   EF 50 % No ischemia  4/17    Echo   EF 60 %   7/21 Echo  55-60   6/22 Myoview 45% Moderate reversible Perfusion   7/22 LHC  LIMA-LADp; SVG-OM1-T; SVG-PD-T: L>R collaterals  8/22 Echo  55-60%    .    Date Ap  8/21 240  11/21 320  5/22 322        Past Medical History:  Diagnosis Date   Anxiety    Arthritis    Atrial fibrillation ([place])    a. Dx 2013, recurred 02/2014, CHA2DS2VASc = 3 -->placed on Eliquis;  b. 02/2014 Echo: EF 50-55%, mid and apical anterior septum and mid and apical inf septum are abnl, mild to mod Ao sclerosis w/o AS.   Cancer associated pain    Chicken pox    Chronic  lymphocytic leukemia ([place])    a. Dx 02/2014.   CLL (chronic lymphocytic leukemia) ([facility])    Complication of anesthesia    History of  PTSD--do not touch patient when waking up from surgery.   COPD (chronic obstructive pulmonary disease) (HCC)    Coronary artery disease    a. 04/2009 CABG x 3 (LIMA->LAD, VG->OM1, VG->PDA);  b. 09/2009 Cath: occluded VG x 2 w/ patent LIMA and L->R collats. EF 55%, mild antlat HK;  c. 10/2011 MV: EF 53%, no isch/infarct-->low risk.   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   TIA (transient ischemic attack) 11/02/2015    Past Surgical History:  Procedure Laterality Date   ABDOMINAL HERNIA REPAIR  APPENDECTOMY  06/21/1985   CARDIAC CATHETERIZATION  2010; 2011   ; Dr Fletcher Anon   CORONARY ARTERY BYPASS GRAFT  04/2009   "CABG X3"   EP IMPLANTABLE DEVICE N/A 03/03/2016   Procedure: Pacemaker Implant;  Surgeon: Deboraha Sprang, MD;  Location: [facility];  Service: Cardiovascular;  Laterality: N/A;   FOREIGN BODY REMOVAL  1968   "shrapnel in my tailbone"   INGUINAL HERNIA REPAIR Right    INSERT / REPLACE / [place] Right 2018   LAPAROSCOPIC CHOLECYSTECTOMY     RIGHT/LEFT HEART CATH AND CORONARY/GRAFT ANGIOGRAPHY N/A 02/04/2021   Procedure: RIGHT/LEFT HEART CATH AND CORONARY/GRAFT ANGIOGRAPHY;  Surgeon: Nelva Bush, MD;  Location: [facility];  Service: Cardiovascular;  Laterality: N/A;   TONSILLECTOMY AND ADENOIDECTOMY  1956   TOTAL HIP ARTHROPLASTY Right 10/22/2016    Procedure: TOTAL HIP ARTHROPLASTY;  Surgeon: Dereck Leep, MD;  Location: [facility];  Service: Orthopedics;  Laterality: Right;   TOTAL HIP ARTHROPLASTY Left 11/04/2017   Procedure: TOTAL HIP ARTHROPLASTY;  Surgeon: Dereck Leep, MD;  Location: [facility];  Service: Orthopedics;  Laterality: Left;    Current Outpatient Medications  Medication Sig Dispense Refill   acetaminophen (TYLENOL) 500 MG tablet Take 1,000 mg by mouth every 8 (eight) hours as needed for mild pain.     acyclovir (ZOVIRAX) 400 MG tablet TAKE 1 TABLET(400 MG) BY MOUTH TWICE DAILY 60 tablet 6   albuterol (PROVENTIL HFA;VENTOLIN HFA) 108 (90 Base) MCG/ACT inhaler Inhale 2 puffs into the lungs every 6 (six) hours as needed for wheezing or shortness of breath. 1 Inhaler 11   Alirocumab (PRALUENT) 150 MG/ML SOAJ Inject 150 mg into the skin every 14 (fourteen) days. 2 mL 12   amiodarone (PACERONE) 200 MG tablet Take 1/2 tablet (100 mg) by mouth twice daily 5 days a week 60 tablet 6   apixaban (ELIQUIS) 5 MG TABS tablet Take 5 mg by mouth 2 (two) times daily.     atorvastatin (LIPITOR) 80 MG tablet TAKE ONE TABLET BY MOUTH AT BEDTIME FOR CHOLESTEROL     azelastine (ASTELIN) 0.1 % nasal spray Place 2 sprays into both nostrils 2 (two) times daily. Use in each nostril as directed 30 mL 3   budesonide-formoterol (SYMBICORT) 160-4.5 MCG/ACT inhaler Inhale 2 puffs into the lungs 2 (two) times daily. 1 Inhaler 11   cetirizine (ZYRTEC) 10 MG tablet Take 10 mg by mouth daily as needed for allergies.      Coenzyme Q10 (COQ10) 200 MG CAPS Take 200 mg by mouth daily.     ezetimibe (ZETIA) 10 MG tablet Take 1 tablet (10 mg total) by mouth daily. 90 tablet 3   fluticasone-salmeterol (ADVAIR) 100-50 MCG/ACT AEPB Inhale 1 puff into the lungs 2 (two) times daily.     furosemide (LASIX) 20 MG tablet Take 2 tablets (40 mg total) by mouth daily. Do not take on Thursdays 180 tablet 3   Krill Oil 350 MG CAPS Take 350 mg by mouth daily as needed (Heart).      metoprolol tartrate (LOPRESSOR) 50 MG tablet Take 1 tablet (50 mg total) by mouth 2 (two) times daily. 180 tablet 3   mirtazapine (REMERON) 15 MG tablet Take 15 mg by mouth at bedtime as needed (for panic associated with PTSD).      Multiple Vitamin (MULTIVITAMIN WITH MINERALS) TABS tablet Take 1 tablet by mouth daily.     nitroGLYCERIN (NITROSTAT) 0.4 MG SL  tablet Place 1 tablet (0.4 mg total) under the tongue every 5 (five) minutes as needed for chest pain. 25 tablet 6   Tiotropium Bromide Monohydrate 2.5 MCG/ACT AERS Inhale 2 puffs into the lungs at bedtime. Spiriva     traMADol (ULTRAM) 50 MG tablet Take 1 tablet (50 mg total) by mouth every 12 (twelve) hours as needed. 60 tablet 0   venetoclax (VENCLEXTA) 100 MG tablet TAKE 1 TABLET BY MOUTH ONCE DAILY 30 tablet 0   No current facility-administered medications for this visit.    No Known Allergies    Review of Systems negative except from HPI and PMH  Physical Exam BP 98/62 (BP Location: Left Arm, Patient Position: Sitting, Cuff Size: Normal)    Pulse 64    Ht 5\' 9"  (1.753 m)    Wt 222 lb (100.7 kg)    SpO2 98%    BMI 32.78 kg/m  Well developed and well nourished in no acute distress HENT normal Neck supple with JVP-flat Clear Device pocket well healed; without hematoma or erythema.  There is no tethering  Regular rate and rhythm, no  gallop No  murmur Abd-soft with active BS No Clubbing cyanosis  edema Skin-warm and dry A & Oriented  Grossly normal sensory and motor function  ECG atrial paced at 64 Intervals 29/10/45    Assessment and  Plan  Sinus node dysfunction With chronotropic incompetence  First-degree AV block  Paroxysmal atrial fibrillation  CAD s/p CABG  High Risk Medication Surveillance - Amiodarone  Dyspnea on exertion-chronic  Pacemaker-[employer]  Hypertension/orthostatic hypotension  CLL recurring   No interval atrial fibrillation of which he is aware.  We will continue the amiodarone at  200 mg 5 days a week which he takes by preference in divided doses  No bleeding.  Continue Eliquis at 5 mg twice daily  With orthostatic hypotension and an interval fall, and the lack of data for the benefits of beta-blockers and chronic coronary disease, we will discontinue his metoprolol.  We will plan to see him in a few months to see whether he may need compression and or something like ProAmatine to mitigate his orthostasis  No angina.  We will continue him on Eliquis.  Wean him off his beta-blocker as noted

## 2021-07-03 ENCOUNTER — Ambulatory Visit: Payer: Medicare HMO

## 2021-07-03 LAB — TSH: TSH: 1.9 u[IU]/mL (ref 0.450–4.500)

## 2021-07-23 ENCOUNTER — Other Ambulatory Visit (HOSPITAL_COMMUNITY): Payer: Self-pay

## 2021-07-23 ENCOUNTER — Other Ambulatory Visit: Payer: Self-pay | Admitting: Internal Medicine

## 2021-07-23 MED ORDER — VENETOCLAX 100 MG PO TABS
ORAL_TABLET | Freq: Every day | ORAL | 0 refills | Status: DC
  Filled 2021-07-31: qty 30, 30d supply, fill #0

## 2021-07-31 ENCOUNTER — Other Ambulatory Visit (HOSPITAL_COMMUNITY): Payer: Self-pay

## 2021-08-01 ENCOUNTER — Other Ambulatory Visit: Payer: Self-pay

## 2021-08-01 ENCOUNTER — Encounter: Payer: Self-pay | Admitting: Internal Medicine

## 2021-08-01 ENCOUNTER — Inpatient Hospital Stay (HOSPITAL_BASED_OUTPATIENT_CLINIC_OR_DEPARTMENT_OTHER): Payer: Medicare HMO | Admitting: Internal Medicine

## 2021-08-01 ENCOUNTER — Inpatient Hospital Stay: Payer: Medicare HMO | Attending: Internal Medicine

## 2021-08-01 DIAGNOSIS — D649 Anemia, unspecified: Secondary | ICD-10-CM | POA: Diagnosis not present

## 2021-08-01 DIAGNOSIS — Z7901 Long term (current) use of anticoagulants: Secondary | ICD-10-CM | POA: Insufficient documentation

## 2021-08-01 DIAGNOSIS — R197 Diarrhea, unspecified: Secondary | ICD-10-CM | POA: Insufficient documentation

## 2021-08-01 DIAGNOSIS — Z79899 Other long term (current) drug therapy: Secondary | ICD-10-CM | POA: Insufficient documentation

## 2021-08-01 DIAGNOSIS — C9110 Chronic lymphocytic leukemia of B-cell type not having achieved remission: Secondary | ICD-10-CM | POA: Diagnosis present

## 2021-08-01 DIAGNOSIS — Z87891 Personal history of nicotine dependence: Secondary | ICD-10-CM | POA: Diagnosis not present

## 2021-08-01 DIAGNOSIS — M2550 Pain in unspecified joint: Secondary | ICD-10-CM | POA: Insufficient documentation

## 2021-08-01 DIAGNOSIS — I4891 Unspecified atrial fibrillation: Secondary | ICD-10-CM | POA: Diagnosis not present

## 2021-08-01 LAB — COMPREHENSIVE METABOLIC PANEL
ALT: 26 U/L (ref 0–44)
AST: 23 U/L (ref 15–41)
Albumin: 4.4 g/dL (ref 3.5–5.0)
Alkaline Phosphatase: 87 U/L (ref 38–126)
Anion gap: 6 (ref 5–15)
BUN: 21 mg/dL (ref 8–23)
CO2: 28 mmol/L (ref 22–32)
Calcium: 8.6 mg/dL — ABNORMAL LOW (ref 8.9–10.3)
Chloride: 106 mmol/L (ref 98–111)
Creatinine, Ser: 0.96 mg/dL (ref 0.61–1.24)
GFR, Estimated: >60 mL/min (ref >60)
Glucose, Bld: 90 mg/dL (ref 70–99)
Potassium: 3.7 mmol/L (ref 3.5–5.1)
Sodium: 140 mmol/L (ref 135–145)
Total Bilirubin: 0.8 mg/dL (ref 0.3–1.2)
Total Protein: 6.8 g/dL (ref 6.5–8.1)

## 2021-08-01 LAB — CBC WITH DIFFERENTIAL/PLATELET
Abs Immature Granulocytes: 0.02 10*3/uL (ref 0.00–0.07)
Basophils Absolute: 0 10*3/uL (ref 0.0–0.1)
Basophils Relative: 0 %
Eosinophils Absolute: 0.1 10*3/uL (ref 0.0–0.5)
Eosinophils Relative: 1 %
HCT: 36.8 % — ABNORMAL LOW (ref 39.0–52.0)
Hemoglobin: 12.6 g/dL — ABNORMAL LOW (ref 13.0–17.0)
Immature Granulocytes: 0 %
Lymphocytes Relative: 33 %
Lymphs Abs: 1.8 10*3/uL (ref 0.7–4.0)
MCH: 35.3 pg — ABNORMAL HIGH (ref 26.0–34.0)
MCHC: 34.2 g/dL (ref 30.0–36.0)
MCV: 103.1 fL — ABNORMAL HIGH (ref 80.0–100.0)
Monocytes Absolute: 0.8 10*3/uL (ref 0.1–1.0)
Monocytes Relative: 14 %
Neutro Abs: 2.9 10*3/uL (ref 1.7–7.7)
Neutrophils Relative %: 52 %
Platelets: 133 10*3/uL — ABNORMAL LOW (ref 150–400)
RBC: 3.57 MIL/uL — ABNORMAL LOW (ref 4.22–5.81)
RDW: 13.9 % (ref 11.5–15.5)
WBC: 5.6 10*3/uL (ref 4.0–10.5)
nRBC: 0 % (ref 0.0–0.2)

## 2021-08-01 LAB — LACTATE DEHYDROGENASE: LDH: 144 U/L (ref 98–192)

## 2021-08-01 MED ORDER — TRAMADOL HCL 50 MG PO TABS: 50.0000 mg | ORAL_TABLET | Freq: Two times a day (BID) | ORAL | 0 refills | Status: DC | PRN

## 2021-08-01 NOTE — Progress Notes (Signed)
[facility] OFFICE PROGRESS NOTE  Patient Care Team: Leone Haven, MD as PCP - General (Family Medicine) Rockey Situ Kathlene November, MD as PCP - Cardiology (Cardiology) [name], MD as Consulting Physician (Cardiology) Cammie Sickle, MD as Medical Oncologist (Hematology and Oncology)   Cancer Staging  No matching staging information was found for the patient.   Oncology History Overview Note  # AUG 2015- SLL/CLL [Right Ax Ln Bx] s/p Benda-Rituxan x6 [finished March 2016]; Maintenance Rituxan q 102m [started April 2016; Dr.Pandit];Last Ritux Jan 2017.  MARCH 2017- CT N/C/A/P- NED. STOP Ritux; surveillance   # AUG 2019- CT/PET- progression/NO transformation;  # NOV 2019- Progression; started Ibrutinib 420 mg/d. STOPPED in end of feb sec to extreme fatigue/joint pains/cramps  #November 01, 2018-start ibrutinib 280 mg a day; December 20, 2018-discontinue ibrutinib secondary multiple side effects.   #January 03, 2019 start Gazyva + Ven [July]; finished Dec 2020-; started [facility] maintenance [200 mg a day; DI-amiodarone]; SEP 2022- congestive heart failure; s/p cardiac cath-noted to have CAD however no stents placed.  2D echo- EF-55%  #Mid March 2021-venetoclax dose reduced to 100 mg [second diarrhea].   #? 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; IGVH- UN-MUTATED [poor prognosis]  SURVIVORSHIP: p  DIAGNOSIS: CLL   STAGE: IV  ;GOALS: control  CURRENT/MOST RECENT THERAPY : venetoclax     CLL (chronic lymphocytic leukemia) ([place])  01/03/2019 -  Chemotherapy   The patient had obinutuzumab (GAZYVA) 100 mg in sodium chloride 0.9 % 100 mL (0.9615 mg/mL) chemo infusion, 100  mg, Intravenous, Once, 6 of 6 cycles Administration: 100 mg (01/03/2019), 900 mg (01/04/2019), 1,000 mg (01/10/2019), 1,000 mg (01/31/2019), 1,000 mg (01/17/2019), 1,000 mg (02/28/2019), 1,000 mg (04/07/2019), 1,000 mg (05/05/2019), 1,000 mg (06/02/2019)   for chemotherapy treatment.       INTERVAL HISTORY: Alone.  Ambulating with a cane.   [name] 76 y.o.  male CLL-high risk currently on venetoclax is here for follow-up/.  Patient continues to have chronic joint pains myalgias not any worse.  Mild diarrhea.  Denies any new lumps or bumps.  No headache.  No weight loss.  No night sweats.  \Review of Systems  Constitutional:  Positive for malaise/fatigue. Negative for chills, diaphoresis and fever.  HENT:  Negative for nosebleeds and sore throat.   Eyes:  Negative for double vision.  Respiratory:  Negative for hemoptysis, shortness of breath and wheezing.   Cardiovascular:  Negative for chest pain, palpitations, orthopnea and leg swelling.  Gastrointestinal:  Positive for diarrhea. Negative for abdominal pain, blood in stool, constipation, heartburn, melena, nausea and vomiting.  Genitourinary:  Negative for dysuria, frequency and urgency.  Musculoskeletal:  Positive for back pain, joint pain and myalgias.  Skin: Negative.  Negative for itching and rash.  Neurological:  Negative for dizziness, tingling, focal weakness and headaches.  Psychiatric/Behavioral:  Negative for depression. The patient is not nervous/anxious and does not have insomnia.      PAST MEDICAL HISTORY :  Past Medical History:  Diagnosis Date   Anxiety    Arthritis    Atrial fibrillation ([place])    a. Dx 2013, recurred 02/2014, CHA2DS2VASc = 3 -->placed on Eliquis;  b. 02/2014 Echo: EF 50-55%,  mid and apical anterior septum and mid and apical inf septum are abnl, mild to mod Ao sclerosis w/o AS.   Cancer associated pain    Chicken pox    Chronic lymphocytic leukemia ([place])    a. Dx 02/2014.   CLL (chronic  lymphocytic leukemia) (HCC)    Complication of anesthesia    History of  PTSD--do not touch patient when waking up from surgery.   COPD (chronic obstructive pulmonary disease) (HCC)    Coronary artery disease    a. 04/2009 CABG x 3 (LIMA->LAD, VG->OM1, VG->PDA);  b. 09/2009 Cath: occluded VG x 2 w/ patent LIMA and L->R collats. EF 55%, mild antlat HK;  c. 10/2011 MV: EF 53%, no isch/infarct-->low risk.   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   TIA (transient ischemic attack) 11/02/2015    PAST SURGICAL HISTORY :   Past Surgical History:  Procedure Laterality Date   ABDOMINAL HERNIA REPAIR     APPENDECTOMY  06/21/1985   CARDIAC CATHETERIZATION  2010; 2011   ; Dr Fletcher Anon   CORONARY ARTERY BYPASS GRAFT  04/2009   "CABG X3"   EP IMPLANTABLE DEVICE N/A 03/03/2016   Procedure: Pacemaker Implant;  Surgeon: Deboraha Sprang, MD;  Location: [place] [facility];  Service: Cardiovascular;  Laterality: N/A;   FOREIGN BODY REMOVAL  1968   "shrapnel in my tailbone"   INGUINAL HERNIA REPAIR Right    INSERT / REPLACE / [place] Right 2018   LAPAROSCOPIC CHOLECYSTECTOMY     RIGHT/LEFT HEART CATH AND CORONARY/GRAFT ANGIOGRAPHY N/A 02/04/2021   Procedure: RIGHT/LEFT HEART CATH AND CORONARY/GRAFT ANGIOGRAPHY;  Surgeon: Nelva Bush, MD;  Location: [facility];  Service: Cardiovascular;  Laterality: N/A;   TONSILLECTOMY AND ADENOIDECTOMY  1956   TOTAL HIP ARTHROPLASTY Right 10/22/2016   Procedure: TOTAL HIP ARTHROPLASTY;   Surgeon: Dereck Leep, MD;  Location: [facility];  Service: Orthopedics;  Laterality: Right;   TOTAL HIP ARTHROPLASTY Left 11/04/2017   Procedure: TOTAL HIP ARTHROPLASTY;  Surgeon: Dereck Leep, MD;  Location: [facility];  Service: Orthopedics;  Laterality: Left;    FAMILY HISTORY :   Family History  Problem Relation Age of Onset   Heart disease Mother    Heart attack Mother    Coronary artery disease Other        family history    SOCIAL HISTORY:   Social History   Tobacco Use   Smoking status: Former    Packs/day: 1.00    Years: 40.00    Pack years: 40.00    Types: Cigarettes    Quit date: 07/21/2006    Years since quitting: 15.0   Smokeless tobacco: Never  Vaping Use   Vaping Use: Former  Substance Use Topics   Alcohol use: Not Currently   Drug use: No    ALLERGIES:  has No Known Allergies.  MEDICATIONS:  Current Outpatient Medications  Medication Sig Dispense Refill  acetaminophen (TYLENOL) 500 MG tablet Take 1,000 mg by mouth every 8 (eight) hours as needed for mild pain.     acyclovir (ZOVIRAX) 400 MG tablet TAKE 1 TABLET(400 MG) BY MOUTH TWICE DAILY 60 tablet 6   albuterol (PROVENTIL HFA;VENTOLIN HFA) 108 (90 Base) MCG/ACT inhaler Inhale 2 puffs into the lungs every 6 (six) hours as needed for wheezing or shortness of breath. 1 Inhaler 11   Alirocumab (PRALUENT) 150 MG/ML SOAJ Inject 150 mg into the skin every 14 (fourteen) days. 2 mL 12   amiodarone (PACERONE) 200 MG tablet Take 1/2 tablet (100 mg) by mouth twice daily 5 days a week 60 tablet 6   apixaban (ELIQUIS) 5 MG TABS tablet Take 5 mg by mouth 2 (two) times daily.     atorvastatin (LIPITOR) 80 MG tablet TAKE ONE TABLET BY MOUTH AT BEDTIME FOR CHOLESTEROL     azelastine (ASTELIN) 0.1 % nasal spray Place 2 sprays into both nostrils 2 (two) times daily. Use in each nostril as directed 30 mL 3   budesonide-formoterol (SYMBICORT) 160-4.5 MCG/ACT inhaler Inhale 2 puffs into the lungs 2 (two) times daily. 1  Inhaler 11   cetirizine (ZYRTEC) 10 MG tablet Take 10 mg by mouth daily as needed for allergies.      Coenzyme Q10 (COQ10) 200 MG CAPS Take 200 mg by mouth daily.     ezetimibe (ZETIA) 10 MG tablet Take 1 tablet (10 mg total) by mouth daily. 90 tablet 3   fluticasone-salmeterol (ADVAIR) 100-50 MCG/ACT AEPB Inhale 1 puff into the lungs 2 (two) times daily.     furosemide (LASIX) 20 MG tablet Take 2 tablets (40 mg total) by mouth daily. Do not take on Thursdays 180 tablet 3   Krill Oil 350 MG CAPS Take 350 mg by mouth daily as needed (Heart).     mirtazapine (REMERON) 15 MG tablet Take 15 mg by mouth at bedtime as needed (for panic associated with PTSD).      Multiple Vitamin (MULTIVITAMIN WITH MINERALS) TABS tablet Take 1 tablet by mouth daily.     nitroGLYCERIN (NITROSTAT) 0.4 MG SL tablet Place 1 tablet (0.4 mg total) under the tongue every 5 (five) minutes as needed for chest pain. 25 tablet 6   Tiotropium Bromide Monohydrate 2.5 MCG/ACT AERS Inhale 2 puffs into the lungs at bedtime. Spiriva     venetoclax (VENCLEXTA) 100 MG tablet TAKE 1 TABLET BY MOUTH ONCE DAILY 30 tablet 0   traMADol (ULTRAM) 50 MG tablet Take 1 tablet (50 mg total) by mouth every 12 (twelve) hours as needed. 60 tablet 0   No current facility-administered medications for this visit.    PHYSICAL EXAMINATION: ECOG PERFORMANCE STATUS: 1 - Symptomatic but completely ambulatory  BP 122/77    Pulse 63    Temp (!) 96.6 F (35.9 C) (Tympanic)    Wt 222 lb 6.4 oz (100.9 kg)    BMI 32.84 kg/m   Filed Weights   08/01/21 1037  Weight: 222 lb 6.4 oz (100.9 kg)    Physical Exam Constitutional:      Comments: Patient is alone.  HENT:     Head: Normocephalic and atraumatic.     Mouth/Throat:     Pharynx: No oropharyngeal exudate.  Eyes:     Pupils: Pupils are equal, round, and reactive to light.  Cardiovascular:     Rate and Rhythm: Normal rate and regular rhythm.  Pulmonary:     Effort: No respiratory distress.  Breath sounds: No wheezing.  Abdominal:     General: Bowel sounds are normal. There is no distension.     Palpations: Abdomen is soft. There is no mass.     Tenderness: There is no abdominal tenderness. There is no guarding or rebound.  Musculoskeletal:        General: No tenderness. Normal range of motion.     Cervical back: Normal range of motion and neck supple.  Lymphadenopathy:     Comments: Resolved lymph nodes in the neck underarms.  Skin:    General: Skin is warm.  Neurological:     Mental Status: He is alert and oriented to person, place, and time.  Psychiatric:        Mood and Affect: Affect normal.    LABORATORY DATA:  I have reviewed the data as listed    Component Value Date/Time   NA 140 08/01/2021 1009   NA 143 03/11/2021 1136   NA 139 10/11/2014 1800   K 3.7 08/01/2021 1009   K 3.3 (L) 10/11/2014 1800   CL 106 08/01/2021 1009   CL 106 10/11/2014 1800   CO2 28 08/01/2021 1009   CO2 27 10/11/2014 1800   GLUCOSE 90 08/01/2021 1009   GLUCOSE 107 (H) 10/11/2014 1800   BUN 21 08/01/2021 1009   BUN 13 03/11/2021 1136   BUN 15 10/11/2014 1800   CREATININE 0.96 08/01/2021 1009   CREATININE 0.89 10/11/2014 1800   CALCIUM 8.6 (L) 08/01/2021 1009   CALCIUM 8.8 (L) 10/11/2014 1800   PROT 6.8 08/01/2021 1009   PROT 6.7 05/18/2017 1048   PROT 6.4 (L) 10/11/2014 1800   ALBUMIN 4.4 08/01/2021 1009   ALBUMIN 4.3 05/18/2017 1048   ALBUMIN 4.1 10/11/2014 1800   AST 23 08/01/2021 1009   AST 23 10/11/2014 1800   ALT 26 08/01/2021 1009   ALT 22 10/11/2014 1800   ALKPHOS 87 08/01/2021 1009   ALKPHOS 61 10/11/2014 1800   BILITOT 0.8 08/01/2021 1009   BILITOT 0.7 05/18/2017 1048   BILITOT 0.9 10/11/2014 1800   GFRNONAA >60 08/01/2021 1009   GFRNONAA >60 10/11/2014 1800   GFRAA >60 04/12/2020 0959   GFRAA >60 10/11/2014 1800    No results found for: SPEP, UPEP  Lab Results  Component Value Date   WBC 5.6 08/01/2021   NEUTROABS 2.9 08/01/2021   HGB 12.6 (L)  08/01/2021   HCT 36.8 (L) 08/01/2021   MCV 103.1 (H) 08/01/2021   PLT 133 (L) 08/01/2021      Chemistry      Component Value Date/Time   NA 140 08/01/2021 1009   NA 143 03/11/2021 1136   NA 139 10/11/2014 1800   K 3.7 08/01/2021 1009   K 3.3 (L) 10/11/2014 1800   CL 106 08/01/2021 1009   CL 106 10/11/2014 1800   CO2 28 08/01/2021 1009   CO2 27 10/11/2014 1800   BUN 21 08/01/2021 1009   BUN 13 03/11/2021 1136   BUN 15 10/11/2014 1800   CREATININE 0.96 08/01/2021 1009   CREATININE 0.89 10/11/2014 1800      Component Value Date/Time   CALCIUM 8.6 (L) 08/01/2021 1009   CALCIUM 8.8 (L) 10/11/2014 1800   ALKPHOS 87 08/01/2021 1009   ALKPHOS 61 10/11/2014 1800   AST 23 08/01/2021 1009   AST 23 10/11/2014 1800   ALT 26 08/01/2021 1009   ALT 22 10/11/2014 1800   BILITOT 0.8 08/01/2021 1009   BILITOT 0.7 05/18/2017 1048   BILITOT  0.9 10/11/2014 1800       RADIOGRAPHIC STUDIES: I have personally reviewed the radiological images as listed and agreed with the findings in the report. No results found.   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 NOV 7th, 2022-  Bilateral axial lymph nodes at the upper limits normal increased in size from comparison CT; Slight increased in pelvic LN. Monitor for now.   # Currently on Venoclax [100 mg sec to diarrhea; DI with Amio];  Labs today reviewed- STABLE.   #Diarrhea-G-1 ? venatolclax- monitor for now.STABLE.   # Low immunoglobulins IgG 330/recent severe respiratory infection/CLL-s/pI IVIG #4/4 [last April 2022].NOV 2022- IgG > 500.  Monitor for now.  # A.fib-on amiodarone [drug interaction-amiodarone; Dr.Gollan] sinus rhythm-on Eliquis/CHF- lasix 40/d- s/p Cath [AUG, 2022- Echo-55%] -STABLE.  # MIld Anemia Hb ~11-12; June 2022-ferritin 17 iron saturation 33%  --STABLE.  # Joint pains/ Muscle pain [improved after stopping statin; shot-ok]-sec to Ventoclax-G-1-2;? R continue tramadol/CBD prn; refilled- -STABLE.  # Shingles prophylaxis: [facility]ingles injection thru [facility]/dead vaccine [would discontinue acyclovir in 3-6 months] .   DISPOSITION: # follow up in 2 months  MD; labs- cbc/cmp/ldh- - Dr.B       Orders Placed This Encounter  Procedures   CBC with Differential/Platelet    Standing Status:   Future    Standing Expiration Date:   08/01/2022   Comprehensive metabolic panel    Standing Status:   Future    Standing Expiration Date:   08/01/2022   Lactate dehydrogenase    Standing Status:   Future    Standing Expiration Date:   08/01/2022    All questions were answered. The patient knows to call the clinic with any problems, questions or concerns.      Cammie Sickle, MD 08/02/2021 1:39 PM

## 2021-08-01 NOTE — Progress Notes (Signed)
Pt is doing well.  He said his breathing is a little worse today and he would like a refill on his pain medication (Tramadol) Pt has states that [place] told him that they could give him the shots for Shingles but would need an okay in writing that he could receive these.

## 2021-08-01 NOTE — Assessment & Plan Note (Addendum)
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 NOV 7th, 2022-  Bilateral axial lymph nodes at the upper limits normal increased in size from comparison CT; Slight increased in pelvic LN. Monitor for now.   # Currently on Venoclax [100 mg sec to diarrhea; DI with Amio];  Labs today reviewed- STABLE.   #Diarrhea-G-1 ? venatolclax- monitor for now.STABLE.   # Low immunoglobulins IgG 330/recent severe respiratory infection/CLL-s/pI IVIG #4/4 [last April 2022].NOV 2022- IgG > 500.  Monitor for now.  # A.fib-on amiodarone [drug interaction-amiodarone; Dr.Gollan] sinus rhythm-on Eliquis/CHF- lasix 40/d- s/p Cath [AUG, 2022- Echo-55%] -STABLE.  # MIld Anemia Hb ~11-12; June 2022-ferritin 17 iron saturation 33% --STABLE.  # Joint pains/ Muscle pain [improved after stopping statin; shot-ok]-sec to Ventoclax-G-1-2;? R continue tramadol/CBD prn; refilled- -STABLE.  # Shingles prophylaxis: recommend shingles injection [facility]/dead vaccine [would discontinue acyclovir in 3-6 months] .   DISPOSITION: # follow up in 2 months  MD; labs- cbc/cmp/ldh- - Dr.B

## 2021-08-02 ENCOUNTER — Encounter: Payer: Self-pay | Admitting: Internal Medicine

## 2021-08-06 ENCOUNTER — Ambulatory Visit: Payer: Medicare HMO

## 2021-08-16 ENCOUNTER — Ambulatory Visit (INDEPENDENT_AMBULATORY_CARE_PROVIDER_SITE_OTHER): Payer: Medicare HMO

## 2021-08-16 VITALS — Ht 69.0 in | Wt 222.0 lb

## 2021-08-16 DIAGNOSIS — Z0000 Encounter for general adult medical examination without abnormal findings: Secondary | ICD-10-CM

## 2021-08-16 NOTE — Patient Instructions (Addendum)
[name] , Thank you for taking time to come for your Medicare Wellness Visit. I appreciate your ongoing commitment to your health goals. Please review the following plan we discussed and let me know if I can assist you in the future.   These are the goals we discussed:  Goals      Maintain Healthy Lifestyle     Stay hydrated Healthy diet Stay active walking on the farm and around the house        This is a list of the screening recommended for you and due dates:  Health Maintenance  Topic Date Due   Zoster (Shingles) Vaccine (1 of 2) 11/14/2021*   Tetanus Vaccine  05/08/2031   Pneumonia Vaccine  Completed   Flu Shot  Completed   COVID-19 Vaccine  Completed   Hepatitis C Screening: USPSTF Recommendation to screen - Ages 18-79 yo.  Completed   HPV Vaccine  Aged Out   Colon Cancer Screening  Discontinued  *Topic was postponed. The date shown is not the original due date.    Advanced directives: not yet completed  Conditions/risks identified: none new  Follow up in one year for your annual wellness visit.   Preventive Care 67 Years and Older, Male Preventive care refers to lifestyle choices and visits with your health care provider that can promote health and wellness. What does preventive care include? A yearly physical exam. This is also called an annual well check. Dental exams once or twice a year. Routine eye exams. Ask your health care provider how often you should have your eyes checked. Personal lifestyle choices, including: Daily care of your teeth and gums. Regular physical activity. Eating a healthy diet. Avoiding tobacco and drug use. Limiting alcohol use. Practicing safe sex. Taking low doses of aspirin every day. Taking vitamin and mineral supplements as recommended by your health care provider. What happens during an annual well check? The services and screenings done by your health care provider during your annual well check will depend on your age,  overall health, lifestyle risk factors, and family history of disease. Counseling  Your health care provider may ask you questions about your: Alcohol use. Tobacco use. Drug use. Emotional well-being. Home and relationship well-being. Sexual activity. Eating habits. History of falls. Memory and ability to understand (cognition). Work and work Statistician. Screening  You may have the following tests or measurements: Height, weight, and BMI. Blood pressure. Lipid and cholesterol levels. These may be checked every 5 years, or more frequently if you are over 21 years old. Skin check. Lung cancer screening. You may have this screening every year starting at age 77 if you have a 30-pack-year history of smoking and currently smoke or have quit within the past 15 years. Fecal occult blood test (FOBT) of the stool. You may have this test every year starting at age 65. Flexible sigmoidoscopy or colonoscopy. You may have a sigmoidoscopy every 5 years or a colonoscopy every 10 years starting at age 72. Prostate cancer screening. Recommendations will vary depending on your family history and other risks. Hepatitis C blood test. Hepatitis B blood test. Sexually transmitted disease (STD) testing. Diabetes screening. This is done by checking your blood sugar (glucose) after you have not eaten for a while (fasting). You may have this done every 1-3 years. Abdominal aortic aneurysm (AAA) screening. You may need this if you are a current or former smoker. Osteoporosis. You may be screened starting at age 77 if you are at high risk.  Talk with your health care provider about your test results, treatment options, and if necessary, the need for more tests. Vaccines  Your health care provider may recommend certain vaccines, such as: Influenza vaccine. This is recommended every year. Tetanus, diphtheria, and acellular pertussis (Tdap, Td) vaccine. You may need a Td booster every 10 years. Zoster vaccine.  You may need this after age 63. Pneumococcal 13-valent conjugate (PCV13) vaccine. One dose is recommended after age 77. Pneumococcal polysaccharide (PPSV23) vaccine. One dose is recommended after age 77. Talk to your health care provider about which screenings and vaccines you need and how often you need them. This information is not intended to replace advice given to you by your health care provider. Make sure you discuss any questions you have with your health care provider. Document Released: 08/03/2015 Document Revised: 03/26/2016 Document Reviewed: 05/08/2015 Elsevier Interactive Patient Education  2017 [place] Prevention in the Home Falls can cause injuries. They can happen to people of all ages. There are many things you can do to make your home safe and to help prevent falls. What can I do on the outside of my home? Regularly fix the edges of walkways and driveways and fix any cracks. Remove anything that might make you trip as you walk through a door, such as a raised step or threshold. Trim any bushes or trees on the path to your home. Use bright outdoor lighting. Clear any walking paths of anything that might make someone trip, such as rocks or tools. Regularly check to see if handrails are loose or broken. Make sure that both sides of any steps have handrails. Any raised decks and porches should have guardrails on the edges. Have any leaves, snow, or ice cleared regularly. Use sand or salt on walking paths during winter. Clean up any spills in your garage right away. This includes oil or grease spills. What can I do in the bathroom? Use night lights. Install grab bars by the toilet and in the tub and shower. Do not use towel bars as grab bars. Use non-skid mats or decals in the tub or shower. If you need to sit down in the shower, use a plastic, non-slip stool. Keep the floor dry. Clean up any water that spills on the floor as soon as it happens. Remove soap  buildup in the tub or shower regularly. Attach bath mats securely with double-sided non-slip rug tape. Do not have throw rugs and other things on the floor that can make you trip. What can I do in the bedroom? Use night lights. Make sure that you have a light by your bed that is easy to reach. Do not use any sheets or blankets that are too big for your bed. They should not hang down onto the floor. Have a firm chair that has side arms. You can use this for support while you get dressed. Do not have throw rugs and other things on the floor that can make you trip. What can I do in the kitchen? Clean up any spills right away. Avoid walking on wet floors. Keep items that you use a lot in easy-to-reach places. If you need to reach something above you, use a strong step stool that has a grab bar. Keep electrical cords out of the way. Do not use floor polish or wax that makes floors slippery. If you must use wax, use non-skid floor wax. Do not have throw rugs and other things on the floor that can make  you trip. What can I do with my stairs? Do not leave any items on the stairs. Make sure that there are handrails on both sides of the stairs and use them. Fix handrails that are broken or loose. Make sure that handrails are as long as the stairways. Check any carpeting to make sure that it is firmly attached to the stairs. Fix any carpet that is loose or worn. Avoid having throw rugs at the top or bottom of the stairs. If you do have throw rugs, attach them to the floor with carpet tape. Make sure that you have a light switch at the top of the stairs and the bottom of the stairs. If you do not have them, ask someone to add them for you. What else can I do to help prevent falls? Wear shoes that: Do not have high heels. Have rubber bottoms. Are comfortable and fit you well. Are closed at the toe. Do not wear sandals. If you use a stepladder: Make sure that it is fully opened. Do not climb a closed  stepladder. Make sure that both sides of the stepladder are locked into place. Ask someone to hold it for you, if possible. Clearly mark and make sure that you can see: Any grab bars or handrails. First and last steps. Where the edge of each step is. Use tools that help you move around (mobility aids) if they are needed. These include: Canes. Walkers. Scooters. Crutches. Turn on the lights when you go into a dark area. Replace any light bulbs as soon as they burn out. Set up your furniture so you have a clear path. Avoid moving your furniture around. If any of your floors are uneven, fix them. If there are any pets around you, be aware of where they are. Review your medicines with your doctor. Some medicines can make you feel dizzy. This can increase your chance of falling. Ask your doctor what other things that you can do to help prevent falls. This information is not intended to replace advice given to you by your health care provider. Make sure you discuss any questions you have with your health care provider. Document Released: 05/03/2009 Document Revised: 12/13/2015 Document Reviewed: 08/11/2014 Elsevier Interactive Patient Education  2017 [employer].

## 2021-08-16 NOTE — Progress Notes (Signed)
Subjective:   [name] is a 76 y.o. male who presents for Medicare Annual/Subsequent preventive examination.  Review of Systems    No ROS.  Medicare Wellness Virtual Visit.  Visual/audio telehealth visit, UTA vital signs.   See social history for additional risk factors.   Cardiac Risk Factors include: advanced age (>99men, >46 women)     Objective:    Today's Vitals   08/16/21 1034  Weight: 222 lb (100.7 kg)  Height: 5\' 9"  (1.753 m)   Body mass index is 32.78 kg/m.  Advanced Directives 08/16/2021 08/01/2021 05/30/2021 05/02/2021 02/07/2021 12/27/2020 11/15/2020  Does Patient Have a Medical Advance Directive? No No Yes Yes Yes Yes Yes  Type of Advance Directive - Public librarian;Living will [place];Living will [place];Living will [place];Living will [place];Living will  Does patient want to make changes to medical advance directive? No - Patient declined No - Patient declined No - Patient declined No - Patient declined No - Patient declined No - Patient declined No - Patient declined  Copy of Healthcare Power of Attorney in Chart? - - - No - copy requested No - copy requested - No - copy requested  Would patient like information on creating a medical advance directive? - - No - Patient declined No - Patient declined No - Patient declined - No - Patient declined    Current Medications (verified) Outpatient Encounter Medications as of 08/16/2021  Medication Sig   acetaminophen (TYLENOL) 500 MG tablet Take 1,000 mg by mouth every 8 (eight) hours as needed for mild pain.   acyclovir (ZOVIRAX) 400 MG tablet TAKE 1 TABLET(400 MG) BY MOUTH TWICE DAILY   albuterol (PROVENTIL HFA;VENTOLIN HFA) 108 (90 Base) MCG/ACT inhaler Inhale 2 puffs into the lungs every 6 (six) hours as needed for wheezing or shortness of breath.   Alirocumab (PRALUENT) 150 MG/ML SOAJ Inject 150 mg into the skin every  14 (fourteen) days.   amiodarone (PACERONE) 200 MG tablet Take 1/2 tablet (100 mg) by mouth twice daily 5 days a week   apixaban (ELIQUIS) 5 MG TABS tablet Take 5 mg by mouth 2 (two) times daily.   atorvastatin (LIPITOR) 80 MG tablet TAKE ONE TABLET BY MOUTH AT BEDTIME FOR CHOLESTEROL   azelastine (ASTELIN) 0.1 % nasal spray Place 2 sprays into both nostrils 2 (two) times daily. Use in each nostril as directed   budesonide-formoterol (SYMBICORT) 160-4.5 MCG/ACT inhaler Inhale 2 puffs into the lungs 2 (two) times daily.   cetirizine (ZYRTEC) 10 MG tablet Take 10 mg by mouth daily as needed for allergies.    Coenzyme Q10 (COQ10) 200 MG CAPS Take 200 mg by mouth daily.   ezetimibe (ZETIA) 10 MG tablet Take 1 tablet (10 mg total) by mouth daily.   fluticasone-salmeterol (ADVAIR) 100-50 MCG/ACT AEPB Inhale 1 puff into the lungs 2 (two) times daily.   furosemide (LASIX) 20 MG tablet Take 2 tablets (40 mg total) by mouth daily. Do not take on Thursdays   Krill Oil 350 MG CAPS Take 350 mg by mouth daily as needed (Heart).   mirtazapine (REMERON) 15 MG tablet Take 15 mg by mouth at bedtime as needed (for panic associated with PTSD).    Multiple Vitamin (MULTIVITAMIN WITH MINERALS) TABS tablet Take 1 tablet by mouth daily.   nitroGLYCERIN (NITROSTAT) 0.4 MG SL tablet Place 1 tablet (0.4 mg total) under the tongue every 5 (five) minutes as needed for  chest pain.   Tiotropium Bromide Monohydrate 2.5 MCG/ACT AERS Inhale 2 puffs into the lungs at bedtime. Spiriva   traMADol (ULTRAM) 50 MG tablet Take 1 tablet (50 mg total) by mouth every 12 (twelve) hours as needed.   venetoclax (VENCLEXTA) 100 MG tablet TAKE 1 TABLET BY MOUTH ONCE DAILY   No facility-administered encounter medications on file as of 08/16/2021.    Allergies (verified) Patient has no known allergies.   History: Past Medical History:  Diagnosis Date   Anxiety    Arthritis    Atrial fibrillation ([place])    a. Dx 2013, recurred 02/2014,  CHA2DS2VASc = 3 -->placed on Eliquis;  b. 02/2014 Echo: EF 50-55%, mid and apical anterior septum and mid and apical inf septum are abnl, mild to mod Ao sclerosis w/o AS.   Cancer associated pain    Chicken pox    Chronic lymphocytic leukemia ([place])    a. Dx 02/2014.   CLL (chronic lymphocytic leukemia) (HCC)    Complication of anesthesia    History of  PTSD--do not touch patient when waking up from surgery.   COPD (chronic obstructive pulmonary disease) (HCC)    Coronary artery disease    a. 04/2009 CABG x 3 (LIMA->LAD, VG->OM1, VG->PDA);  b. 09/2009 Cath: occluded VG x 2 w/ patent LIMA and L->R collats. EF 55%, mild antlat HK;  c. 10/2011 MV: EF 53%, no isch/infarct-->low risk.   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   TIA (transient ischemic attack) 11/02/2015   Past Surgical History:  Procedure Laterality Date   ABDOMINAL HERNIA REPAIR     APPENDECTOMY  06/21/1985   CARDIAC CATHETERIZATION  2010; 2011   ; Dr Fletcher Anon   CORONARY ARTERY BYPASS GRAFT  04/2009   "CABG X3"   EP IMPLANTABLE DEVICE N/A 03/03/2016   Procedure: Pacemaker Implant;  Surgeon: Deboraha Sprang, MD;  Location: [place] [facility];  Service: Cardiovascular;  Laterality: N/A;   FOREIGN BODY REMOVAL  1968   "shrapnel in my tailbone"   INGUINAL HERNIA REPAIR Right    INSERT / REPLACE / [place] Right 2018   LAPAROSCOPIC CHOLECYSTECTOMY     RIGHT/LEFT HEART CATH AND CORONARY/GRAFT ANGIOGRAPHY N/A 02/04/2021   Procedure: RIGHT/LEFT HEART CATH AND  CORONARY/GRAFT ANGIOGRAPHY;  Surgeon: Nelva Bush, MD;  Location: [place] [facility];  Service: Cardiovascular;  Laterality: N/A;   TONSILLECTOMY AND ADENOIDECTOMY  1956   TOTAL HIP ARTHROPLASTY Right 10/22/2016   Procedure: TOTAL HIP ARTHROPLASTY;  Surgeon: Dereck Leep, MD;  Location: [facility];  Service: Orthopedics;  Laterality: Right;   TOTAL HIP ARTHROPLASTY Left 11/04/2017   Procedure: TOTAL HIP ARTHROPLASTY;  Surgeon: Dereck Leep, MD;  Location: [facility];  Service: Orthopedics;  Laterality: Left;   Family History  Problem Relation Age of Onset   Heart disease Mother    Heart attack Mother    Coronary artery disease Other  family history   Social History   Socioeconomic History   Marital status: Married    Spouse name: Not on file   Number of children: Not on file   Years of education: Not on file   Highest education level: Not on file  Occupational History   Occupation: retired    Fish farm manager: [employer]    Comment: Education officer, [employer]  Tobacco Use   Smoking status: Former    Packs/day: 1.00    Years: 40.00    Pack years: 40.00    Types: Cigarettes    Quit date: 07/21/2006    Years since quitting: 15.0   Smokeless tobacco: Never  Vaping Use   Vaping Use: Former  Substance and Sexual Activity   Alcohol use: Not Currently   Drug use: No   Sexual activity: Not Currently  Other Topics Concern   Not on file  Social History Narrative   Lives in [place] with wife. Dogs. Goats. Children - 4. Grandchildren - 7.      Worked - Sports coach, part time.   Diet - healthy   Exercise - very active      Service - [employer] - [place]   Social Determinants of Health   Financial Resource Strain: Low Risk    Difficulty of Paying Living Expenses: Not hard at all  Food Insecurity: No Food Insecurity   Worried About Charity fundraiser in the Last Year: Never true   Arboriculturist in the Last Year: Never true  Transportation Needs: No Transportation  Needs   Lack of Transportation (Medical): No   Lack of Transportation (Non-Medical): No  Physical Activity: Sufficiently Active   Days of Exercise per Week: 7 days   Minutes of Exercise per Session: 30 min  Stress: No Stress Concern Present   Feeling of Stress : Not at all  Social Connections: Unknown   Frequency of Communication with Friends and Family: More than three times a week   Frequency of Social Gatherings with Friends and Family: Not on file   Attends Religious Services: Not on Electrical engineer or Organizations: Not on file   Attends Archivist Meetings: Not on file   Marital Status: Married    Tobacco Counseling Counseling given: Not Answered   Clinical Intake:  Pre-visit preparation completed: Yes        Diabetes: No  How often do you need to have someone help you when you read instructions, pamphlets, or other written materials from your doctor or pharmacy?: 1 - Never    Interpreter Needed?: No      Activities of Daily Living In your present state of health, do you have any difficulty performing the following activities: 08/16/2021  Hearing? Y  Comment Hearing aids  Vision? N  Difficulty concentrating or making decisions? N  Walking or climbing stairs? Y  Comment Ambulates with walker/cane  Dressing or bathing? N  Doing errands, shopping? N  Preparing Food and eating ? N  Using the Toilet? N  In the past six months, have you accidently leaked urine? N  Do you have problems with loss of bowel control? N  Managing your Medications? N  Managing your Finances? N  Housekeeping or managing your Housekeeping? N  Some recent data might be hidden    Patient Care Team: Leone Haven, MD as PCP - General (Family Medicine) Rockey Situ Kathlene November, MD as PCP - Cardiology (Cardiology) [name], MD as Consulting  Physician (Cardiology) Cammie Sickle, MD as Medical Oncologist (Hematology and Oncology)  Indicate any  recent Medical Services you may have received from other than [facility] providers in the past year (date may be approximate).     Assessment:   This is a routine wellness examination for [facility].  Virtual Visit via Telephone Note  I connected with  [name] on 08/16/21 at  9:45 AM EST by telephone and verified that I am speaking with the correct person using two identifiers.  Persons participating in the virtual visit: patient/Nurse Health Advisor   I discussed the limitations, risks, security and privacy concerns of performing an evaluation and management service by telephone and the availability of in person appointments. The patient expressed understanding and agreed to proceed.  Interactive audio and video telecommunications were attempted between this nurse and patient, however failed, due to patient having technical difficulties OR patient did not have access to video capability.  We continued and completed visit with audio only.  Some vital signs may be absent or patient reported.   Hearing/Vision screen Hearing Screening - Comments:: Hearing aids Vision Screening - Comments:: Followed by [place]  Wears corrective lenses They have regular follow up with the ophthalmologist  Dietary issues and exercise activities discussed: Current Exercise Habits: Home exercise routine, Type of exercise: walking, Intensity: Mild Healthy diet    Goals Addressed             This Visit's Progress    Maintain Healthy Lifestyle       Stay hydrated Healthy diet Stay active walking on the farm and around the house       Depression Screen PHQ 2/9 Scores 08/16/2021 12/25/2020 05/14/2020 01/17/2020 09/16/2019 05/13/2019 05/12/2019  PHQ - 2 Score 0 0 0 0 0 0 0  PHQ- 9 Score - - - - - - -    Fall Risk Fall Risk  08/16/2021 12/25/2020 05/14/2020 01/17/2020 09/16/2019  Falls in the past year? 1 0 0 0 0  Number falls in past yr: 1 0 0 0 0  Comment - - - - -  Injury with Fall? - 0 0 - -  Follow  up Falls evaluation completed - Falls evaluation completed Falls evaluation completed Falls evaluation completed    [place]: Home free of loose throw rugs in walkways, pet beds, electrical cords, etc? Yes  Adequate lighting in your home to reduce risk of falls? Yes   ASSISTIVE DEVICES UTILIZED TO PREVENT FALLS: Life alert? No  Use of a cane, walker or w/c? No   TIMED UP AND GO: Was the test performed? No .   Cognitive Function:  Patient is alert and oriented x3.    6CIT Screen 05/12/2019 03/19/2018  What Year? 0 points 0 points  What month? 0 points 0 points  What time? 0 points 0 points  Count back from 20 0 points 0 points  Months in reverse 0 points 0 points  Repeat phrase 0 points 0 points  Total Score 0 0   Immunizations Immunization History  Administered Date(s) Administered   Fluad Quad(high Dose 65+) 04/04/2020, 05/02/2021   Influenza Split 05/21/2012   Influenza, High Dose Seasonal PF 04/21/2018   Influenza, Seasonal, Injecte, Preservative Fre 03/31/2012, 04/06/2013   Influenza,inj,Quad PF,6+ Mos 03/16/2014, 06/20/2015, 04/27/2016, 04/17/2017   Influenza-Unspecified 08/06/2015, 05/12/2016, 04/15/2018   PFIZER(Purple Top)SARS-COV-2 Vaccination 08/22/2019, 09/15/2019, 10/11/2019, 04/17/2020   Pfizer Covid-19 Vaccine Bivalent Booster 69yrs & up 05/17/2021   Pneumococcal  Conjugate-13 04/07/2014, 07/05/2015   Pneumococcal Polysaccharide-23 07/21/2008, 01/07/2011, 04/21/2015, 08/06/2015   Tdap 03/28/2013, 01/14/2018, 05/07/2021   Shingrix Completed?: No.    Education has been provided regarding the importance of this vaccine. Patient has been advised to call insurance company to determine out of pocket expense if they have not yet received this vaccine. Advised may also receive vaccine at local pharmacy or Health Dept. Verbalized acceptance and understanding.  Screening Tests Health Maintenance  Topic Date Due   Zoster Vaccines-  Shingrix (1 of 2) 11/14/2021 (Originally 06/21/1965)   TETANUS/TDAP  05/08/2031   Pneumonia Vaccine 45+ Years old  Completed   INFLUENZA VACCINE  Completed   COVID-19 Vaccine  Completed   Hepatitis C Screening  Completed   HPV VACCINES  Aged Out   COLONOSCOPY (Pts 45-29yrs Insurance coverage will need to be confirmed)  Discontinued   Health Maintenance There are no preventive care reminders to display for this patient.  Vision Screening: Recommended annual ophthalmology exams for early detection of glaucoma and other disorders of the eye.  Dental Screening: Recommended annual dental exams for proper oral hygiene  Community Resource Referral / Chronic Care Management: CRR required this visit?  No   CCM required this visit?  No      Plan:   Keep all routine maintenance appointments.   I have personally reviewed and noted the following in the patients chart:   Medical and social history Use of alcohol, tobacco or illicit drugs  Current medications and supplements including opioid prescriptions. Patient is currently taking opioid prescriptions. Information provided to patient regarding non-opioid alternatives. Patient advised to discuss non-opioid treatment plan with their provider. Taking Ultram. Followed by Oncology.  Functional ability and status Nutritional status Physical activity Advanced directives List of other physicians Hospitalizations, surgeries, and ER visits in previous 12 months Vitals Screenings to include cognitive, depression, and falls Referrals and appointments  In addition, I have reviewed and discussed with patient certain preventive protocols, quality metrics, and best practice recommendations. A written personalized care plan for preventive services as well as general preventive health recommendations were provided to patient.     Varney Biles, LPN   07/29/3233

## 2021-08-26 ENCOUNTER — Other Ambulatory Visit (HOSPITAL_COMMUNITY): Payer: Self-pay

## 2021-08-26 ENCOUNTER — Other Ambulatory Visit: Payer: Self-pay | Admitting: Internal Medicine

## 2021-08-26 DIAGNOSIS — C9110 Chronic lymphocytic leukemia of B-cell type not having achieved remission: Secondary | ICD-10-CM

## 2021-08-26 MED ORDER — VENETOCLAX 100 MG PO TABS
ORAL_TABLET | Freq: Every day | ORAL | 0 refills | Status: DC
  Filled 2021-08-27: qty 30, 30d supply, fill #0

## 2021-08-26 MED ORDER — ACYCLOVIR 400 MG PO TABS: ORAL_TABLET | ORAL | 6 refills | Status: DC

## 2021-08-27 ENCOUNTER — Other Ambulatory Visit (HOSPITAL_COMMUNITY): Payer: Self-pay

## 2021-09-02 ENCOUNTER — Other Ambulatory Visit (HOSPITAL_COMMUNITY): Payer: Self-pay

## 2021-09-03 ENCOUNTER — Emergency Department: Payer: Medicare HMO

## 2021-09-03 ENCOUNTER — Inpatient Hospital Stay: Payer: Medicare HMO | Attending: Internal Medicine | Admitting: Hospice and Palliative Medicine

## 2021-09-03 ENCOUNTER — Telehealth: Payer: Self-pay | Admitting: *Deleted

## 2021-09-03 ENCOUNTER — Other Ambulatory Visit: Payer: Self-pay

## 2021-09-03 ENCOUNTER — Inpatient Hospital Stay
Admission: EM | Admit: 2021-09-03 | Discharge: 2021-09-06 | DRG: 177 | Disposition: A | Discharge status: Home / Self Care | Payer: Medicare HMO | Attending: Student | Admitting: Student

## 2021-09-03 ENCOUNTER — Encounter: Payer: Self-pay | Admitting: Emergency Medicine

## 2021-09-03 DIAGNOSIS — I252 Old myocardial infarction: Secondary | ICD-10-CM

## 2021-09-03 DIAGNOSIS — I480 Paroxysmal atrial fibrillation: Secondary | ICD-10-CM | POA: Diagnosis present

## 2021-09-03 DIAGNOSIS — Z974 Presence of external hearing-aid: Secondary | ICD-10-CM

## 2021-09-03 DIAGNOSIS — I5032 Chronic diastolic (congestive) heart failure: Secondary | ICD-10-CM | POA: Diagnosis present

## 2021-09-03 DIAGNOSIS — Z79899 Other long term (current) drug therapy: Secondary | ICD-10-CM

## 2021-09-03 DIAGNOSIS — C9110 Chronic lymphocytic leukemia of B-cell type not having achieved remission: Secondary | ICD-10-CM | POA: Diagnosis present

## 2021-09-03 DIAGNOSIS — E785 Hyperlipidemia, unspecified: Secondary | ICD-10-CM | POA: Diagnosis present

## 2021-09-03 DIAGNOSIS — F4310 Post-traumatic stress disorder, unspecified: Secondary | ICD-10-CM | POA: Diagnosis present

## 2021-09-03 DIAGNOSIS — U071 COVID-19: Secondary | ICD-10-CM

## 2021-09-03 DIAGNOSIS — Z96643 Presence of artificial hip joint, bilateral: Secondary | ICD-10-CM | POA: Diagnosis present

## 2021-09-03 DIAGNOSIS — E7800 Pure hypercholesterolemia, unspecified: Secondary | ICD-10-CM | POA: Diagnosis present

## 2021-09-03 DIAGNOSIS — I10 Essential (primary) hypertension: Secondary | ICD-10-CM | POA: Diagnosis present

## 2021-09-03 DIAGNOSIS — Z8249 Family history of ischemic heart disease and other diseases of the circulatory system: Secondary | ICD-10-CM

## 2021-09-03 DIAGNOSIS — J9601 Acute respiratory failure with hypoxia: Secondary | ICD-10-CM | POA: Diagnosis present

## 2021-09-03 DIAGNOSIS — Z7901 Long term (current) use of anticoagulants: Secondary | ICD-10-CM

## 2021-09-03 DIAGNOSIS — Z6832 Body mass index (BMI) 32.0-32.9, adult: Secondary | ICD-10-CM

## 2021-09-03 DIAGNOSIS — I429 Cardiomyopathy, unspecified: Secondary | ICD-10-CM

## 2021-09-03 DIAGNOSIS — G4733 Obstructive sleep apnea (adult) (pediatric): Secondary | ICD-10-CM | POA: Diagnosis present

## 2021-09-03 DIAGNOSIS — R0789 Other chest pain: Secondary | ICD-10-CM

## 2021-09-03 DIAGNOSIS — Z951 Presence of aortocoronary bypass graft: Secondary | ICD-10-CM

## 2021-09-03 DIAGNOSIS — Z856 Personal history of leukemia: Secondary | ICD-10-CM

## 2021-09-03 DIAGNOSIS — Z7951 Long term (current) use of inhaled steroids: Secondary | ICD-10-CM

## 2021-09-03 DIAGNOSIS — Z9221 Personal history of antineoplastic chemotherapy: Secondary | ICD-10-CM

## 2021-09-03 DIAGNOSIS — F32A Depression, unspecified: Secondary | ICD-10-CM | POA: Diagnosis present

## 2021-09-03 DIAGNOSIS — Z8673 Personal history of transient ischemic attack (TIA), and cerebral infarction without residual deficits: Secondary | ICD-10-CM

## 2021-09-03 DIAGNOSIS — R531 Weakness: Secondary | ICD-10-CM

## 2021-09-03 DIAGNOSIS — E669 Obesity, unspecified: Secondary | ICD-10-CM | POA: Diagnosis present

## 2021-09-03 DIAGNOSIS — I2510 Atherosclerotic heart disease of native coronary artery without angina pectoris: Secondary | ICD-10-CM | POA: Diagnosis present

## 2021-09-03 DIAGNOSIS — J441 Chronic obstructive pulmonary disease with (acute) exacerbation: Secondary | ICD-10-CM | POA: Diagnosis present

## 2021-09-03 DIAGNOSIS — F419 Anxiety disorder, unspecified: Secondary | ICD-10-CM | POA: Diagnosis present

## 2021-09-03 DIAGNOSIS — Z87891 Personal history of nicotine dependence: Secondary | ICD-10-CM

## 2021-09-03 DIAGNOSIS — Z7952 Long term (current) use of systemic steroids: Secondary | ICD-10-CM

## 2021-09-03 DIAGNOSIS — K219 Gastro-esophageal reflux disease without esophagitis: Secondary | ICD-10-CM | POA: Diagnosis present

## 2021-09-03 DIAGNOSIS — I110 Hypertensive heart disease with heart failure: Secondary | ICD-10-CM | POA: Diagnosis present

## 2021-09-03 DIAGNOSIS — Z950 Presence of cardiac pacemaker: Secondary | ICD-10-CM

## 2021-09-03 LAB — BASIC METABOLIC PANEL
Anion gap: 7 (ref 5–15)
BUN: 19 mg/dL (ref 8–23)
CO2: 23 mmol/L (ref 22–32)
Calcium: 8.6 mg/dL — ABNORMAL LOW (ref 8.9–10.3)
Chloride: 107 mmol/L (ref 98–111)
Creatinine, Ser: 0.9 mg/dL (ref 0.61–1.24)
GFR, Estimated: >60 mL/min (ref >60)
Glucose, Bld: 140 mg/dL — ABNORMAL HIGH (ref 70–99)
Potassium: 3.5 mmol/L (ref 3.5–5.1)
Sodium: 137 mmol/L (ref 135–145)

## 2021-09-03 LAB — CBC
HCT: 36.9 % — ABNORMAL LOW (ref 39.0–52.0)
Hemoglobin: 12.4 g/dL — ABNORMAL LOW (ref 13.0–17.0)
MCH: 34.4 pg — ABNORMAL HIGH (ref 26.0–34.0)
MCHC: 33.6 g/dL (ref 30.0–36.0)
MCV: 102.5 fL — ABNORMAL HIGH (ref 80.0–100.0)
Platelets: 126 10*3/uL — ABNORMAL LOW (ref 150–400)
RBC: 3.6 MIL/uL — ABNORMAL LOW (ref 4.22–5.81)
RDW: 14.1 % (ref 11.5–15.5)
WBC: 7.2 10*3/uL (ref 4.0–10.5)
nRBC: 0 % (ref 0.0–0.2)

## 2021-09-03 LAB — TROPONIN I (HIGH SENSITIVITY)
Troponin I (High Sensitivity): 15 ng/L (ref <18)
Troponin I (High Sensitivity): 12 ng/L (ref <18)

## 2021-09-03 IMAGING — CR DG CHEST 2V
2 series · 2 of 2 positions shown · non-contrast
Comparison: CT chest dated [DATE]

CLINICAL DATA: Chest pain, shortness of breath

EXAM:
CHEST - 2 VIEW

[chest lat]
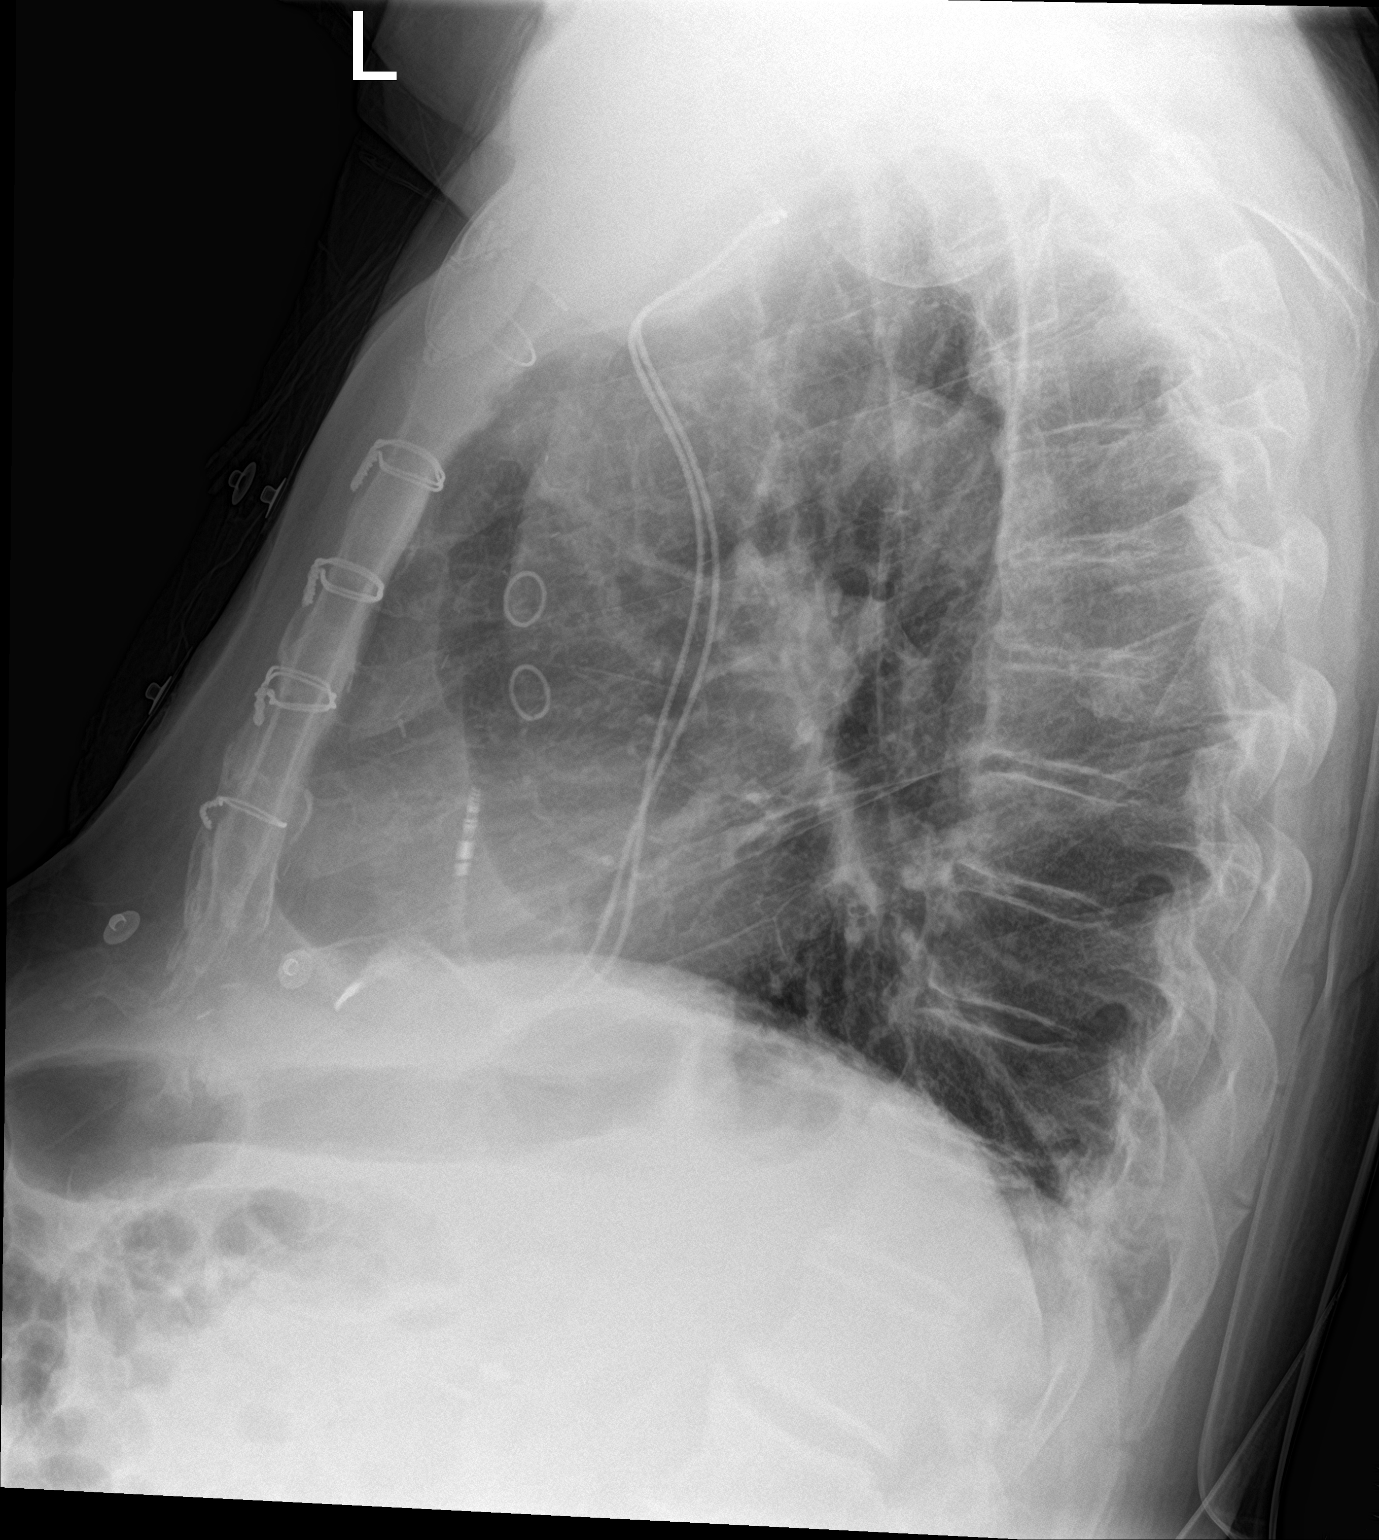

[chest ap]
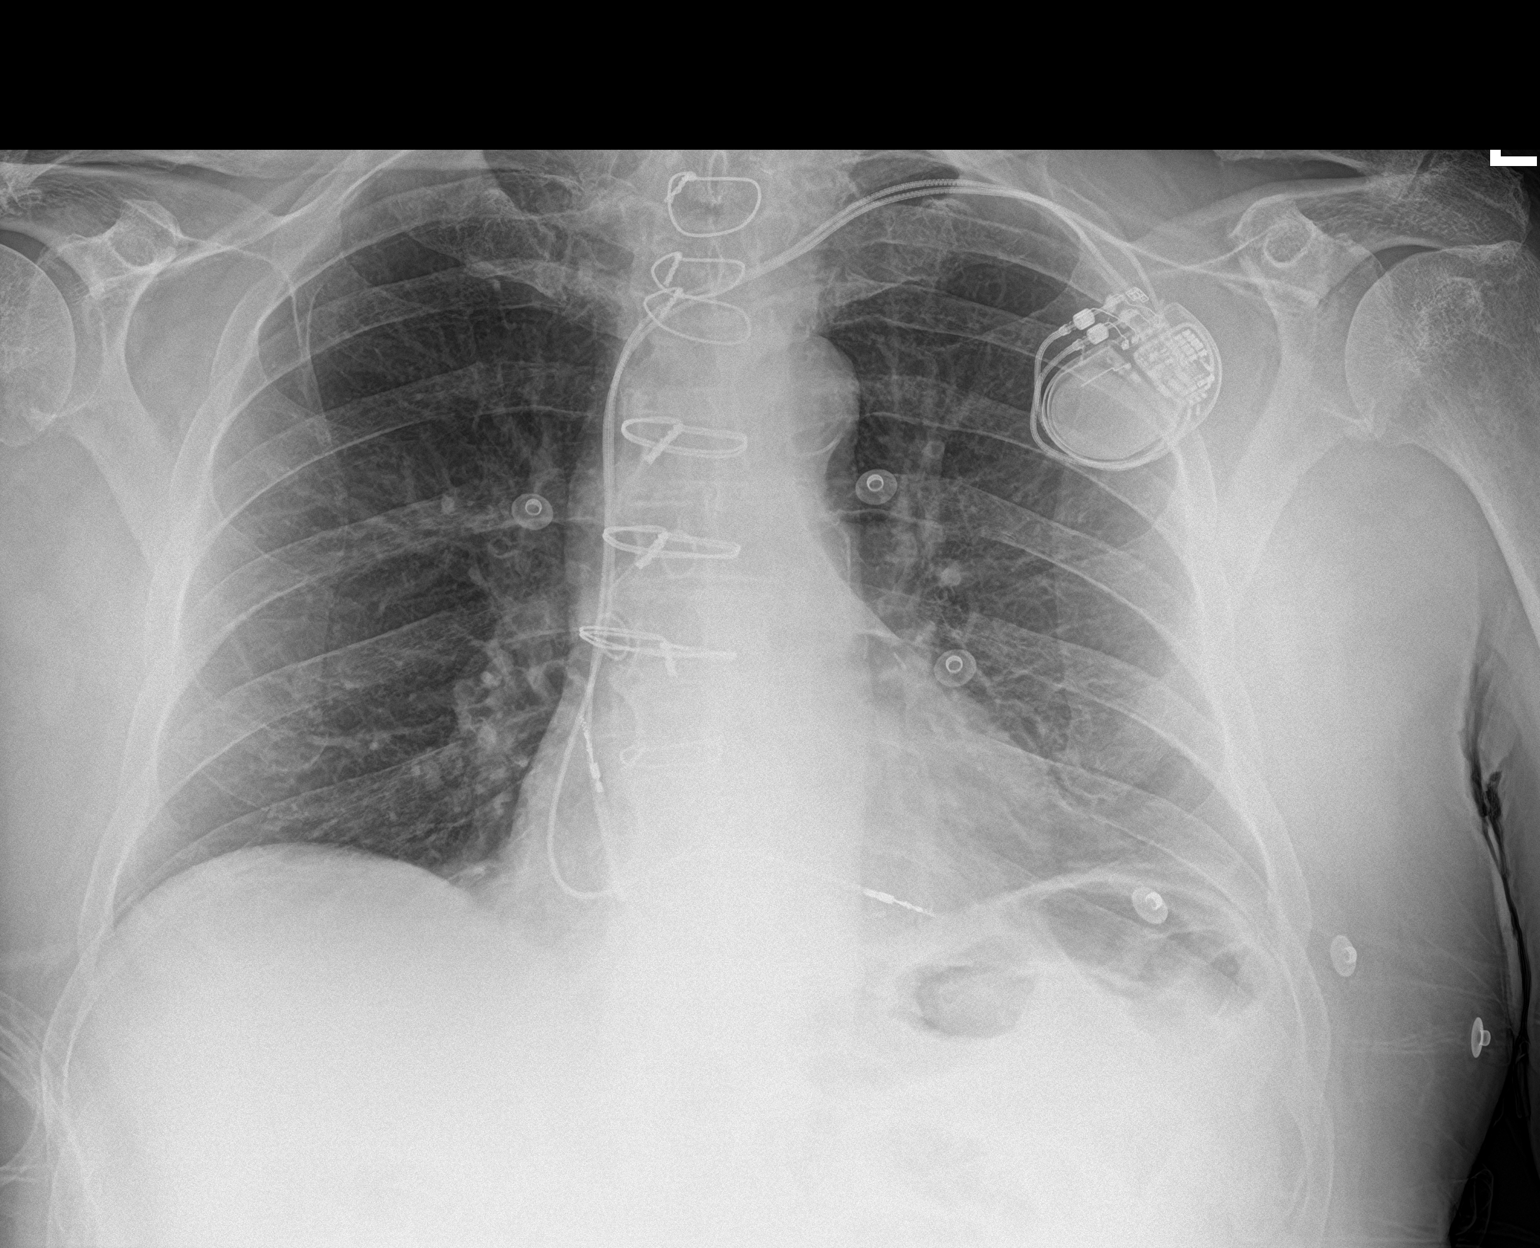

[2 of 2 positions shown; findings below may reference images not displayed]

FINDINGS: The heart is normal in size. Pacemaker leads terminating in the
right atrium and right ventricle. Sternotomy wires representing
prior coronary artery bypass grafting. Atherosclerotic calcification
of the aortic arch. Low lung volumes with left basilar atelectasis.
No evidence of pneumonia, pulmonary edema or pleural effusion. No
acute osseous abnormality.
IMPRESSION: No acute cardiopulmonary process.

## 2021-09-03 MED ORDER — PREDNISONE 10 MG PO TABS: ORAL_TABLET | ORAL | 0 refills | Status: DC

## 2021-09-03 MED ORDER — BENZONATATE 100 MG PO CAPS
100.0000 mg | ORAL_CAPSULE | Freq: Three times a day (TID) | ORAL | Status: DC | PRN
  Administered 2021-09-04 – 2021-09-05 (×2): 100 mg via ORAL
  Filled 2021-09-03 (×2): qty 1

## 2021-09-03 MED ORDER — BENZONATATE 100 MG PO CAPS: 100.0000 mg | ORAL_CAPSULE | Freq: Three times a day (TID) | ORAL | 0 refills | Status: DC | PRN

## 2021-09-03 MED ORDER — TRAMADOL HCL 50 MG PO TABS: 50.0000 mg | ORAL_TABLET | Freq: Two times a day (BID) | ORAL | Status: DC | PRN

## 2021-09-03 MED ORDER — MIRTAZAPINE 15 MG PO TABS: 15.0000 mg | ORAL_TABLET | Freq: Every evening | ORAL | Status: DC | PRN

## 2021-09-03 MED ORDER — IPRATROPIUM-ALBUTEROL 0.5-2.5 (3) MG/3ML IN SOLN
3.0000 mL | Freq: Three times a day (TID) | RESPIRATORY_TRACT | Status: DC
  Filled 2021-09-03: qty 3

## 2021-09-03 MED ORDER — ENOXAPARIN SODIUM 40 MG/0.4ML IJ SOSY: 40.0000 mg | PREFILLED_SYRINGE | INTRAMUSCULAR | Status: DC

## 2021-09-03 MED ORDER — ONDANSETRON HCL 4 MG PO TABS: 4.0000 mg | ORAL_TABLET | Freq: Four times a day (QID) | ORAL | Status: DC | PRN

## 2021-09-03 MED ORDER — METHYLPREDNISOLONE SODIUM SUCC 125 MG IJ SOLR
80.0000 mg | Freq: Every day | INTRAMUSCULAR | Status: AC
  Administered 2021-09-04: 80 mg via INTRAVENOUS
  Filled 2021-09-03: qty 2

## 2021-09-03 MED ORDER — ATORVASTATIN CALCIUM 20 MG PO TABS
80.0000 mg | ORAL_TABLET | Freq: Every day | ORAL | Status: DC
  Administered 2021-09-04 – 2021-09-06 (×3): 80 mg via ORAL
  Filled 2021-09-03 (×3): qty 4

## 2021-09-03 MED ORDER — ONDANSETRON HCL 4 MG/2ML IJ SOLN: 4.0000 mg | Freq: Four times a day (QID) | INTRAMUSCULAR | Status: DC | PRN

## 2021-09-03 MED ORDER — IPRATROPIUM-ALBUTEROL 0.5-2.5 (3) MG/3ML IN SOLN
3.0000 mL | Freq: Once | RESPIRATORY_TRACT | Status: AC
  Administered 2021-09-03: 3 mL via RESPIRATORY_TRACT
  Filled 2021-09-03: qty 3

## 2021-09-03 MED ORDER — ACETAMINOPHEN 650 MG RE SUPP: 650.0000 mg | Freq: Four times a day (QID) | RECTAL | Status: DC | PRN

## 2021-09-03 MED ORDER — METHYLPREDNISOLONE SODIUM SUCC 125 MG IJ SOLR
125.0000 mg | Freq: Once | INTRAMUSCULAR | Status: AC
Start: 2021-09-03 — End: 2021-09-03
  Administered 2021-09-03: 125 mg via INTRAVENOUS
  Filled 2021-09-03: qty 2

## 2021-09-03 MED ORDER — MOLNUPIRAVIR EUA 200MG CAPSULE: 4.0000 | ORAL_CAPSULE | Freq: Two times a day (BID) | ORAL | 0 refills | Status: DC

## 2021-09-03 MED ORDER — NITROGLYCERIN 0.4 MG SL SUBL: 0.4000 mg | SUBLINGUAL_TABLET | SUBLINGUAL | Status: DC | PRN

## 2021-09-03 MED ORDER — APIXABAN 5 MG PO TABS
5.0000 mg | ORAL_TABLET | Freq: Two times a day (BID) | ORAL | Status: DC
  Administered 2021-09-04 – 2021-09-06 (×6): 5 mg via ORAL
  Filled 2021-09-03 (×6): qty 1

## 2021-09-03 MED ORDER — ACETAMINOPHEN 500 MG PO TABS: 1000.0000 mg | ORAL_TABLET | Freq: Four times a day (QID) | ORAL | Status: DC | PRN

## 2021-09-03 MED ORDER — ADULT MULTIVITAMIN W/MINERALS CH
1.0000 | ORAL_TABLET | Freq: Every day | ORAL | Status: DC
  Administered 2021-09-04 – 2021-09-06 (×3): 1 via ORAL
  Filled 2021-09-03 (×3): qty 1

## 2021-09-03 MED ORDER — PREDNISONE 20 MG PO TABS
40.0000 mg | ORAL_TABLET | Freq: Every day | ORAL | Status: DC
  Administered 2021-09-05 – 2021-09-06 (×2): 40 mg via ORAL
  Filled 2021-09-03 (×2): qty 2

## 2021-09-03 NOTE — ED Notes (Signed)
First Nurse Note:  Pt to ED via [facility] from home for shortness of breath and sore throat. Pt tested positive for COVID today. Pt has hx/o COPD and CLL. 12 L showed NS with paced rhythm. Pt was given 125 mg of solumedrol and 1 duoneb treatment. Pt has a 20 G IV in the right hand. Pt sats were 92% before duoneb treatment and 96% after duoneb. Pt is in NAD.

## 2021-09-03 NOTE — ED Provider Triage Note (Signed)
Emergency Medicine Provider Triage Evaluation Note  WAVERLY CHAVARRIA , a 76 y.o. male  was evaluated in triage.  Patient has history of hypertension, atrial fibrillation currently anticoagulated with Eliquis, COPD, prior MI, GERD and chronic lymphocytic leukemia presenting to the emergency department with shortness of breath and some anterior chest pain after being diagnosed with COVID-19 yesterday.  Review of Systems  Positive: Patient has chest pain and SOB.  Negative: No abdominal pain, nausea or vomiting.  Physical Exam  BP (!) 122/58 (BP Location: Right Arm)    Pulse 79    Temp 99.4 F (37.4 C) (Oral)    Resp 20    SpO2 94%  Gen:   Awake, no distress   Resp:  Normal effort  MSK:   Moves extremities without difficulty  Other:    Medical Decision Making  Medically screening exam initiated at 5:28 PM.  Appropriate orders placed.  SAMARTH OGLE was informed that the remainder of the evaluation will be completed by another provider, this initial triage assessment does not replace that evaluation, and the importance of remaining in the ED until their evaluation is complete.     Vallarie Mare [place], [place] 09/03/21 1729

## 2021-09-03 NOTE — ED Triage Notes (Signed)
Pt to ED via [facility] with c/o Shortness of Breath and Chest pain that began yesterday, he also tested positive for COVID yesterday. He called his cancer MD and they told him to come in here to be evaluated. He states that his O2 sensor had been reading in the mid to low 80's today. He does not wear O2 at home

## 2021-09-03 NOTE — ED Provider Notes (Signed)
[facility] Provider Note    Event Date/Time   First MD Initiated Contact with Patient 09/03/21 2256     (approximate)   History   Shortness of Breath and Chest Pain   HPI  [name] is a 76 y.o. male who presents to the ED for evaluation of Shortness of Breath and Chest Pain   I review video visit from earlier today with patient's NP from oncology , patient tested positive at home for COVID-19 on antigen test and was prescribed molnupiravir this afternoon. He has a history of COPD, diastolic CHF, atrial fibrillation on Eliquis and amiodarone, PAD and CLL being treated with venetoclax.  CAD s/p CABG.  Patient presents to the ED for evaluation of 2 days of worsening shortness of breath and chest pain.  He reports associated malaise, generalized weakness and poor appetite.  Denies abdominal pain or emesis or diarrhea.  Despite compliance with medications, reports worsening dyspnea, bilateral and diffuse chest pain throughout the day today is constant for a few hours, but is since resolved.  Denies chest pain right now.  Physical Exam   Triage Vital Signs: ED Triage Vitals  Enc Vitals Group     BP 09/03/21 1718 (!) 122/58     Pulse Rate 09/03/21 1718 79     Resp 09/03/21 1718 20     Temp 09/03/21 1718 99.4 F (37.4 C)     Temp Source 09/03/21 1718 Oral     SpO2 09/03/21 1718 94 %     Weight 09/03/21 1731 220 lb (99.8 kg)     Height 09/03/21 1731 5\' 9"  (1.753 m)     Head Circumference --      Peak Flow --      Pain Score 09/03/21 1731 4     Pain Loc --      Pain Edu? --      Excl. in [place]? --     Most recent vital signs: Vitals:   09/03/21 2230 09/03/21 2300  BP: (!) 130/114 121/60  Pulse: 65 68  Resp:  18  Temp:    SpO2: 94% 95%    General: Awake, no distress.  Appears uncomfortable. CV:  Good peripheral perfusion. RRR Resp:  Tachypneic to the mid 20s.  Scattered expiratory wheezes.  Slightly decreased airflow throughout. Abd:  No  distention.  Soft and nontender MSK:  No deformity noted.  No significant edema noted. Neuro:  No focal deficits appreciated. Other:     ED Results / Procedures / Treatments   Labs (all labs ordered are listed, but only abnormal results are displayed) Labs Reviewed  BASIC METABOLIC PANEL - Abnormal; Notable for the following components:      Result Value   Glucose, Bld 140 (*)    Calcium 8.6 (*)    All other components within normal limits  CBC - Abnormal; Notable for the following components:   RBC 3.60 (*)    Hemoglobin 12.4 (*)    HCT 36.9 (*)    MCV 102.5 (*)    MCH 34.4 (*)    Platelets 126 (*)    All other components within normal limits  MAGNESIUM  TROPONIN I (HIGH SENSITIVITY)  TROPONIN I (HIGH SENSITIVITY)    EKG Sinus rhythm, rate of 82 bpm.  Normal axis.  First-degree AV block and slightly prolonged QTc.  Nonspecific ST changes laterally and septally.  Similar to EKG from December.  RADIOLOGY CXR reviewed by me without evidence of acute cardiopulmonary  pathology.  Official radiology report(s): DG Chest 2 View  Result Date: 09/03/2021 CLINICAL DATA:  Chest pain, shortness of breath EXAM: CHEST - 2 VIEW COMPARISON:  CT chest dated May 27, 2021 FINDINGS: The heart is normal in size. Pacemaker leads terminating in the right atrium and right ventricle. 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 Atherosclerotic calcification of the aortic arch. Low lung volumes with left basilar atelectasis. No evidence of pneumonia, pulmonary edema or pleural effusion. No acute osseous abnormality. IMPRESSION: No acute cardiopulmonary process. Electronically Signed   By: Keane Police D.O.   On: 09/03/2021 18:19    PROCEDURES and INTERVENTIONS:  .1-3 Lead EKG Interpretation Performed by: Vladimir Crofts, MD Authorized by: Vladimir Crofts, MD     Interpretation: normal     ECG rate:  72   ECG rate assessment: normal     Rhythm: sinus rhythm     Ectopy:  none     Conduction: normal    Medications  ipratropium-albuterol (DUONEB) 0.5-2.5 (3) MG/3ML nebulizer solution 3 mL (has no administration in time range)  methylPREDNISolone sodium succinate (SOLU-MEDROL) 125 mg/2 mL injection 125 mg (has no administration in time range)     IMPRESSION / MDM / ASSESSMENT AND PLAN / ED COURSE  I reviewed the triage vital signs and the nursing notes.  76 year old male with multiple chronic medical comorbidities presents to the ED with evidence of COPD exacerbation and chest pain in setting of acute COVID-19 requiring medical admission.  He is hemodynamically stable and not hypoxic, but appears quite uncomfortable and does have stigmata of a COPD exacerbation with wheezing and tachypnea.  His CXR is clear without infiltrate and his basic labs are benign with normal BNP, negative first troponin and no leukocytosis.  No evidence of sepsis or pneumonia.  No evidence of ACS, but does have a slightly prolonged QTc on his EKG.  Added on a magnesium level which is pending at the time of admission.  Provided breathing treatments and steroids in the ED to address COPD.  Due to his comorbidities, difficulty performing ADLs at home and high risk nature, we will consult with medicine for admission.      FINAL CLINICAL IMPRESSION(S) / ED DIAGNOSES   Final diagnoses:  [id number]  Other chest pain  COPD exacerbation ([place])     Rx / DC Orders   ED Discharge Orders     None        Note:  This document was prepared using Dragon voice recognition software and may include unintentional dictation errors.   Vladimir Crofts, MD 09/03/21 [id number]

## 2021-09-03 NOTE — Telephone Encounter (Signed)
Wife called stating that patient O2 levels are bouncing between 85-93% and Josh had said if his O2 dropped below 90 % he needs to go to ER, She was unsure if the fact that he kept fluctuating meant he needed to go to ER or not, I discussed with [facility] RN who states that with hsi comorbidities and the fact that he keeps dropping that he should go to ER. I advised wife of this and she states she is going to have to call EMS to take him because they both have COVID and have no one else to take him due to not want to expose other family members

## 2021-09-03 NOTE — Telephone Encounter (Signed)
Called patient's wife and scheduled a virtual visit for 10am this morning.

## 2021-09-03 NOTE — Telephone Encounter (Signed)
Call from answering service that wife [name] called reporting that patient has tested positive for COVID and is requesting a return call.

## 2021-09-03 NOTE — Progress Notes (Signed)
Virtual Visit via Telephone Note  I connected with [name] on 09/03/21 at 10:00 AM EST by telephone and verified that I am speaking with the correct person using two identifiers.  Location: Patient: Home Provider: Clinic   I discussed the limitations, risks, security and privacy concerns of performing an evaluation and management service by telephone and the availability of in person appointments. I also discussed with the patient that there may be a patient responsible charge related to this service. The patient expressed understanding and agreed to proceed.   History of Present Illness: [name] is a 76 year old male with multiple medical problems including COPD, OSA on CPAP, A-fib, diastolic CHF, PAD, and CLL on active treatment with venetoclax.  Patient requested virtual [facility] visit after testing positive for COVID   Observations/Objective: I called and spoke with patient and wife after having some technical difficulty with MyChart visit.  Wife reports that she tested positive over the weekend for COVID.  Despite quarantining, patient became symptomatic yesterday and tested positive for COVID on a home test.  Patient is vaccinated but last booster was about 6 months ago.  He had chills last night but no fever.  He has productive cough and sore throat.  He does endorse some shortness of breath and wheezing.  No nausea, vomiting, or diarrhea.  Wife is monitoring pulse oximetry which has been ranging from 94% to 97% on room air.  Assessment and Plan: COVID -patient is at high risk given immunocompromised status in addition to his pulmonary comorbidities.  We will start him on molnupiravir.  We will also prescribe prednisone due to reported wheezing.  We will start on antitussive.  Had a long talk with wife about ER precautions/triggers.  I have have a low threshold for having him evaluated in the event of any worsening symptoms.  Discussed with Dr. Rogue Bussing regarding case and plan.   Patient was also advised to hold the venetoclax for at least a week.  He can resume it if he is feeling better after a week or we can see him in the clinic for further evaluation.  Follow Up Instructions: As needed   I discussed the assessment and treatment plan with the patient. The patient was provided an opportunity to ask questions and all were answered. The patient agreed with the plan and demonstrated an understanding of the instructions.   The patient was advised to call back or seek an in-person evaluation if the symptoms worsen or if the condition fails to improve as anticipated.  I provided 15 minutes of non-face-to-face time during this encounter.   Irean Hong, NP

## 2021-09-03 NOTE — ED Notes (Signed)
Pt grandson number is [phone], if pt is to go home in the middle of the night. Not for medical update.

## 2021-09-04 ENCOUNTER — Encounter: Payer: Self-pay | Admitting: Internal Medicine

## 2021-09-04 DIAGNOSIS — I252 Old myocardial infarction: Secondary | ICD-10-CM | POA: Diagnosis not present

## 2021-09-04 DIAGNOSIS — I480 Paroxysmal atrial fibrillation: Secondary | ICD-10-CM | POA: Diagnosis present

## 2021-09-04 DIAGNOSIS — Z9221 Personal history of antineoplastic chemotherapy: Secondary | ICD-10-CM | POA: Diagnosis not present

## 2021-09-04 DIAGNOSIS — U071 COVID-19: Secondary | ICD-10-CM | POA: Diagnosis present

## 2021-09-04 DIAGNOSIS — J441 Chronic obstructive pulmonary disease with (acute) exacerbation: Secondary | ICD-10-CM

## 2021-09-04 DIAGNOSIS — Z6832 Body mass index (BMI) 32.0-32.9, adult: Secondary | ICD-10-CM | POA: Diagnosis not present

## 2021-09-04 DIAGNOSIS — E7800 Pure hypercholesterolemia, unspecified: Secondary | ICD-10-CM | POA: Diagnosis present

## 2021-09-04 DIAGNOSIS — R531 Weakness: Secondary | ICD-10-CM

## 2021-09-04 DIAGNOSIS — Z96643 Presence of artificial hip joint, bilateral: Secondary | ICD-10-CM | POA: Diagnosis present

## 2021-09-04 DIAGNOSIS — Z8673 Personal history of transient ischemic attack (TIA), and cerebral infarction without residual deficits: Secondary | ICD-10-CM | POA: Diagnosis not present

## 2021-09-04 DIAGNOSIS — Z950 Presence of cardiac pacemaker: Secondary | ICD-10-CM | POA: Diagnosis not present

## 2021-09-04 DIAGNOSIS — F32A Depression, unspecified: Secondary | ICD-10-CM | POA: Diagnosis present

## 2021-09-04 DIAGNOSIS — K219 Gastro-esophageal reflux disease without esophagitis: Secondary | ICD-10-CM | POA: Diagnosis present

## 2021-09-04 DIAGNOSIS — R0789 Other chest pain: Secondary | ICD-10-CM | POA: Diagnosis present

## 2021-09-04 DIAGNOSIS — E669 Obesity, unspecified: Secondary | ICD-10-CM | POA: Diagnosis present

## 2021-09-04 DIAGNOSIS — Z8616 Personal history of COVID-19: Secondary | ICD-10-CM

## 2021-09-04 DIAGNOSIS — J9601 Acute respiratory failure with hypoxia: Secondary | ICD-10-CM | POA: Diagnosis present

## 2021-09-04 DIAGNOSIS — Z7901 Long term (current) use of anticoagulants: Secondary | ICD-10-CM | POA: Diagnosis not present

## 2021-09-04 DIAGNOSIS — I5032 Chronic diastolic (congestive) heart failure: Secondary | ICD-10-CM | POA: Diagnosis present

## 2021-09-04 DIAGNOSIS — Z974 Presence of external hearing-aid: Secondary | ICD-10-CM | POA: Diagnosis not present

## 2021-09-04 DIAGNOSIS — G4733 Obstructive sleep apnea (adult) (pediatric): Secondary | ICD-10-CM | POA: Diagnosis present

## 2021-09-04 DIAGNOSIS — I110 Hypertensive heart disease with heart failure: Secondary | ICD-10-CM | POA: Diagnosis present

## 2021-09-04 DIAGNOSIS — I2510 Atherosclerotic heart disease of native coronary artery without angina pectoris: Secondary | ICD-10-CM | POA: Diagnosis present

## 2021-09-04 DIAGNOSIS — Z79899 Other long term (current) drug therapy: Secondary | ICD-10-CM | POA: Diagnosis not present

## 2021-09-04 DIAGNOSIS — Z856 Personal history of leukemia: Secondary | ICD-10-CM | POA: Diagnosis not present

## 2021-09-04 DIAGNOSIS — Z951 Presence of aortocoronary bypass graft: Secondary | ICD-10-CM | POA: Diagnosis not present

## 2021-09-04 DIAGNOSIS — F4310 Post-traumatic stress disorder, unspecified: Secondary | ICD-10-CM | POA: Diagnosis present

## 2021-09-04 HISTORY — DX: Personal history of COVID-19: Z86.16

## 2021-09-04 LAB — CBC
HCT: 35 % — ABNORMAL LOW (ref 39.0–52.0)
Hemoglobin: 12 g/dL — ABNORMAL LOW (ref 13.0–17.0)
MCH: 34.6 pg — ABNORMAL HIGH (ref 26.0–34.0)
MCHC: 34.3 g/dL (ref 30.0–36.0)
MCV: 100.9 fL — ABNORMAL HIGH (ref 80.0–100.0)
Platelets: 117 10*3/uL — ABNORMAL LOW (ref 150–400)
RBC: 3.47 MIL/uL — ABNORMAL LOW (ref 4.22–5.81)
RDW: 13.8 % (ref 11.5–15.5)
WBC: 3.5 10*3/uL — ABNORMAL LOW (ref 4.0–10.5)
nRBC: 0 % (ref 0.0–0.2)

## 2021-09-04 LAB — VITAMIN B12: Vitamin B-12: 1841 pg/mL — ABNORMAL HIGH (ref 180–914)

## 2021-09-04 LAB — FOLATE: Folate: 29 ng/mL (ref >5.9)

## 2021-09-04 LAB — RESP PANEL BY RT-PCR (FLU A&B, COVID) ARPGX2
Influenza A by PCR: NEGATIVE
Influenza B by PCR: NEGATIVE
SARS Coronavirus 2 by RT PCR: POSITIVE — AB

## 2021-09-04 LAB — VITAMIN D 25 HYDROXY (VIT D DEFICIENCY, FRACTURES): Vit D, 25-Hydroxy: 35.34 ng/mL (ref 30–100)

## 2021-09-04 LAB — MAGNESIUM: Magnesium: 2 mg/dL (ref 1.7–2.4)

## 2021-09-04 MED ORDER — IPRATROPIUM-ALBUTEROL 20-100 MCG/ACT IN AERS
1.0000 | INHALATION_SPRAY | Freq: Three times a day (TID) | RESPIRATORY_TRACT | Status: AC
  Administered 2021-09-04 (×3): 1 via RESPIRATORY_TRACT
  Filled 2021-09-04: qty 4

## 2021-09-04 MED ORDER — ISOSORBIDE MONONITRATE ER 60 MG PO TB24
30.0000 mg | ORAL_TABLET | Freq: Every day | ORAL | Status: DC
  Administered 2021-09-04 – 2021-09-06 (×3): 30 mg via ORAL
  Filled 2021-09-04 (×3): qty 1

## 2021-09-04 MED ORDER — SODIUM CHLORIDE 0.9 % IV SOLN
200.0000 mg | Freq: Once | INTRAVENOUS | Status: AC
  Administered 2021-09-04: 200 mg via INTRAVENOUS
  Filled 2021-09-04: qty 200

## 2021-09-04 MED ORDER — HYDRALAZINE HCL 10 MG PO TABS
10.0000 mg | ORAL_TABLET | Freq: Four times a day (QID) | ORAL | Status: DC | PRN
  Administered 2021-09-04: 10 mg via ORAL
  Filled 2021-09-04 (×2): qty 1

## 2021-09-04 MED ORDER — AMIODARONE HCL 200 MG PO TABS
100.0000 mg | ORAL_TABLET | ORAL | Status: DC
  Administered 2021-09-04 – 2021-09-05 (×4): 100 mg via ORAL
  Filled 2021-09-04 (×4): qty 1

## 2021-09-04 MED ORDER — SODIUM CHLORIDE 0.9 % IV SOLN
100.0000 mg | Freq: Every day | INTRAVENOUS | Status: DC
  Administered 2021-09-04 – 2021-09-06 (×3): 100 mg via INTRAVENOUS
  Filled 2021-09-04 (×3): qty 100

## 2021-09-04 MED ORDER — MOMETASONE FURO-FORMOTEROL FUM 200-5 MCG/ACT IN AERO
2.0000 | INHALATION_SPRAY | Freq: Two times a day (BID) | RESPIRATORY_TRACT | Status: DC
  Administered 2021-09-04 – 2021-09-06 (×5): 2 via RESPIRATORY_TRACT
  Filled 2021-09-04: qty 8.8

## 2021-09-04 MED ORDER — EZETIMIBE 10 MG PO TABS
10.0000 mg | ORAL_TABLET | Freq: Every day | ORAL | Status: DC
  Administered 2021-09-04 – 2021-09-06 (×3): 10 mg via ORAL
  Filled 2021-09-04 (×3): qty 1

## 2021-09-04 MED ORDER — HYDROCOD POLI-CHLORPHE POLI ER 10-8 MG/5ML PO SUER
5.0000 mL | Freq: Two times a day (BID) | ORAL | Status: DC | PRN
  Administered 2021-09-04 – 2021-09-06 (×4): 5 mL via ORAL
  Filled 2021-09-04 (×4): qty 5

## 2021-09-04 NOTE — Plan of Care (Signed)

## 2021-09-04 NOTE — Progress Notes (Signed)
Patient currently admitted to hospital for Covid. Infection/COPD flareup. I I just checked on the patient. OK to hold chemotherapy pills.  I called patients wife who unfortunately also has Covid. I would defer hospital service for updates to with regard to patients clinical status.

## 2021-09-04 NOTE — Progress Notes (Signed)
°   09/04/21 1500  Clinical Encounter Type  Visited With Patient  Visit Type Initial  Referral From Other (Comment) (Spiritual Consult for AD)   Chaplain received Spiritual Care Consult requesting AD. Patient has COVID and so Chaplain worked through [employer] to connect with patient and to provide AD information and forms. Chaplain will follow up to finish up AD.

## 2021-09-04 NOTE — Assessment & Plan Note (Signed)
-   Per patient he had transient hypoxia at home, pulse ox reading as low as 86% on room air - Presumed secondary to COVID-19 infection - Continue oxygen supplementation to maintain SPO2 greater than 92% - Treat as above

## 2021-09-04 NOTE — Progress Notes (Signed)
Remdesivir - Pharmacy Brief Note   O:  ALT: 26 CXR:  SpO2: 95 % on RA   A/P:  Remdesivir 200 mg IVPB once followed by 100 mg IVPB daily x 4 days.   [name] D 09/04/2021 1:09 AM

## 2021-09-04 NOTE — Assessment & Plan Note (Addendum)
-   Hydralazine 10 mg p.o. every 6 hours as needed for SBP greater than 180, 4 doses ordered

## 2021-09-04 NOTE — H&P (Addendum)
History and Physical   [name] WCB:[id number] DOB: August 12, 1945 DOA: 09/03/2021  PCP: Leone Haven, MD  Outpatient Specialists: Dr. Rogue Bussing, oncology Patient coming from: Home  I have personally briefly reviewed patient's old medical records in [place].  Chief Concern: Shortness of breath and chest pain  HPI: 76 year old male with history of COPD, OSA on CPAP, hypertension, hyperlipidemia, CAD status post CABG, CLL, depression, who presents emergency department for chief concerns of shortness of breath and chest pain.  Vitals in the emergency department showed temperature of 99.4, respiration rate of 20, heart heart rate 79, blood pressure 122/58, SPO2 94% on room air.  Serum sodium 137, potassium 3.5, chloride 107, bicarb 23, nonfasting blood glucose 140, BUN 19, serum creatinine of 0.90, GFR greater than 60.  WBC 7.2, hemoglobin 12.5, platelets of 126.  High-sensitivity troponin was 15 and decreased to 12.  ED treatment: Solu-Medrol, DuoNebs.  At bedside, he is able to tell me his name, age, and he knows he is in the hospital. He states he tested positive for covid on Sunday and developed worsening shortness of breath on Monday. He denies nausea, vomiting, diarrhea. This morning he checked his pulse ox and it was 91 and they rechecked it later and it was 86% on room air.   He reports for the last two days, he has felt so weak, he can not walk.  Social history: He denies tobacco, etoh, and recreational drug use. He formerly worked as Engineer, structural.   Vaccination history: He is vaccinated for covid.   ROS: Constitutional: no weight change, no fever ENT/Mouth: no sore throat, no rhinorrhea Eyes: no eye pain, no vision changes Cardiovascular: + chest pain, + dyspnea,  no edema, no palpitations Respiratory: + cough, no sputum, no wheezing Gastrointestinal: no nausea, no vomiting, no diarrhea, no constipation Genitourinary: no urinary incontinence, no dysuria,  no hematuria Musculoskeletal: no arthralgias, no myalgias Skin: no skin lesions, no pruritus, Neuro: + weakness, no loss of consciousness, no syncope Psych: no anxiety, no depression, + decrease appetite Heme/Lymph: no bruising, no bleeding  ED Course: Discussed with emergency medicine provider, patient requiring hospitalization for chief concerns of COPD exacerbation.  Assessment/Plan  Principal Problem:   COPD exacerbation (HCC) Active Problems:   Hyperlipidemia   Essential hypertension   Paroxysmal atrial fibrillation (HCC)   CLL (chronic lymphocytic leukemia) (HCC)   CAD (coronary artery disease)   Depression   Cardiomyopathy ([place])   Weakness   Acute respiratory failure with hypoxia (HCC)   COVID-19 virus infection   * COPD exacerbation (HCC) Assessment & Plan - Presumed secondary to COVID-19 infection - DuoNebs 3 times daily - Solu-Medrol 80 IV -Telemetry medical observation  CAD (coronary artery disease) Assessment & Plan - Atorvastatin 80 mg nightly - Nitroglycerin sublingual  Paroxysmal atrial fibrillation (HCC) Assessment & Plan - Resumed home apixaban 5 mg twice daily  Essential hypertension Assessment & Plan - Hydralazine 10 mg p.o. every 6 hours as needed for SBP greater than 180, 4 doses ordered  Acute respiratory failure with hypoxia (HCC) Assessment & Plan - Per patient he had transient hypoxia at home, pulse ox reading as low as 86% on room air - Presumed secondary to COVID-19 infection - Continue oxygen supplementation to maintain SPO2 greater than 92% - Treat as above  COVID-19 virus infection Assessment & Plan - Patient states that he tested positive for COVID-19 infection at home on 09/01/2021 - Discussed with patient that given development of hypoxia at home, I  recommended starting patient on remdesivir and he agreed - Remdesivir per pharmacy ordered - Tussionex as needed for cough - Incentive spirometry and flutter valve ordered -  Continue contact and airborne precaution  Weakness Assessment & Plan - Presumed secondary to covid  Hyperlipidemia Assessment & Plan - Atorvastatin 80 mg daily  Chart reviewed.   DVT prophylaxis: Apixaban twice daily Code Status: Full code Diet: Heart healthy Family Communication: No Disposition Plan: Pending clinical course Consults called: None at this time Admission status: Telemetry medical, observation  Past Medical History:  Diagnosis Date   Anxiety    Arthritis    Atrial fibrillation ([place])    a. Dx 2013, recurred 02/2014, CHA2DS2VASc = 3 -->placed on Eliquis;  b. 02/2014 Echo: EF 50-55%, mid and apical anterior septum and mid and apical inf septum are abnl, mild to mod Ao sclerosis w/o AS.   Cancer associated pain    Chicken pox    Chronic lymphocytic leukemia ([place])    a. Dx 02/2014.   CLL (chronic lymphocytic leukemia) (HCC)    Complication of anesthesia    History of  PTSD--do not touch patient when waking up from surgery.   COPD (chronic obstructive pulmonary disease) (HCC)    Coronary artery disease    a. 04/2009 CABG x 3 (LIMA->LAD, VG->OM1, VG->PDA);  b. 09/2009 Cath: occluded VG x 2 w/ patent LIMA and L->R collats. EF 55%, mild antlat HK;  c. 10/2011 MV: EF 53%, no isch/infarct-->low risk.   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   TIA (transient ischemic attack) 11/02/2015   Past Surgical History:  Procedure Laterality Date   ABDOMINAL HERNIA REPAIR     APPENDECTOMY  06/21/1985   CARDIAC CATHETERIZATION  2010; 2011    ; Dr Fletcher Anon   CORONARY ARTERY BYPASS GRAFT  04/2009   "CABG X3"   EP IMPLANTABLE DEVICE N/A 03/03/2016   Procedure: Pacemaker Implant;  Surgeon: Deboraha Sprang, MD;  Location: [facility];  Service: Cardiovascular;  Laterality: N/A;   FOREIGN BODY REMOVAL  1968   "shrapnel in my tailbone"   INGUINAL HERNIA REPAIR Right    INSERT / REPLACE / [place] Right 2018   LAPAROSCOPIC CHOLECYSTECTOMY     RIGHT/LEFT HEART CATH AND CORONARY/GRAFT ANGIOGRAPHY N/A 02/04/2021   Procedure: RIGHT/LEFT HEART CATH AND CORONARY/GRAFT ANGIOGRAPHY;  Surgeon: Nelva Bush, MD;  Location: [facility];  Service: Cardiovascular;  Laterality: N/A;   TONSILLECTOMY AND ADENOIDECTOMY  1956   TOTAL HIP ARTHROPLASTY Right 10/22/2016   Procedure: TOTAL HIP ARTHROPLASTY;  Surgeon: Dereck Leep, MD;  Location: [facility];  Service: Orthopedics;  Laterality: Right;   TOTAL HIP ARTHROPLASTY Left 11/04/2017   Procedure: TOTAL HIP ARTHROPLASTY;  Surgeon: Dereck Leep, MD;  Location: [facility];  Service: Orthopedics;  Laterality: Left;   Social History:  reports that he quit smoking about 15 years ago. His smoking use included cigarettes. He has a  40.00 pack-year smoking history. He has never used smokeless tobacco. He reports that he does not currently use alcohol. He reports that he does not use drugs.  No Known Allergies Family History  Problem Relation Age of Onset   Heart disease Mother    Heart attack Mother    Coronary artery disease Other        family history   Family history: Family history reviewed and not pertinent.  Prior to Admission medications   Medication Sig Start Date End Date Taking? Authorizing Provider  acetaminophen (TYLENOL) 500 MG tablet Take 1,000 mg by mouth every 8 (eight) hours as needed for mild pain.    [provider]  acyclovir (ZOVIRAX) 400 MG tablet TAKE 1 TABLET(400 MG) BY MOUTH TWICE DAILY 08/26/21   Cammie Sickle, MD   albuterol (PROVENTIL HFA;VENTOLIN HFA) 108 (90 Base) MCG/ACT inhaler Inhale 2 puffs into the lungs every 6 (six) hours as needed for wheezing or shortness of breath. 12/01/16   Wilhelmina Mcardle, MD  Alirocumab (PRALUENT) 150 MG/ML SOAJ Inject 150 mg into the skin every 14 (fourteen) days. 03/26/21   [name], MD  amiodarone (PACERONE) 200 MG tablet Take 1/2 tablet (100 mg) by mouth twice daily 5 days a week 03/13/21   Deboraha Sprang, MD  apixaban (ELIQUIS) 5 MG TABS tablet Take 5 mg by mouth 2 (two) times daily.    [provider]  atorvastatin (LIPITOR) 80 MG tablet TAKE ONE TABLET BY MOUTH AT BEDTIME FOR CHOLESTEROL 08/07/20   [provider]  azelastine (ASTELIN) 0.1 % nasal spray Place 2 sprays into both nostrils 2 (two) times daily. Use in each nostril as directed 07/17/20   Leone Haven, MD  benzonatate (TESSALON) 100 MG capsule Take 1 capsule (100 mg total) by mouth 3 (three) times daily as needed for cough. 09/03/21   Borders, Kirt Boys, NP  budesonide-formoterol (SYMBICORT) 160-4.5 MCG/ACT inhaler Inhale 2 puffs into the lungs 2 (two) times daily. 12/01/16   Wilhelmina Mcardle, MD  cetirizine (ZYRTEC) 10 MG tablet Take 10 mg by mouth daily as needed for allergies.     [provider]  Coenzyme Q10 (COQ10) 200 MG CAPS Take 200 mg by mouth daily.    [provider]  ezetimibe (ZETIA) 10 MG tablet Take 1 tablet (10 mg total) by mouth daily. 01/06/17 11/01/27  [name], MD  fluticasone-salmeterol (ADVAIR) 100-50 MCG/ACT AEPB Inhale 1 puff into the lungs 2 (two) times daily. 08/07/20   [provider]  furosemide (LASIX) 20 MG tablet Take 2 tablets (40 mg total) by mouth daily. Do not take on Thursdays 06/12/21   [name], MD  Javier Docker Oil 350 MG CAPS Take 350 mg by mouth daily as needed (Heart).    [provider]  mirtazapine (REMERON) 15 MG tablet Take 15 mg by mouth at bedtime as needed (for panic associated with PTSD).      [provider]  molnupiravir EUA (LAGEVRIO) 200 mg CAPS capsule Take 4 capsules (800 mg total) by mouth 2 (two) times daily for 5 days. 09/03/21 09/08/21  Borders, Kirt Boys, NP  Multiple Vitamin (MULTIVITAMIN WITH MINERALS) TABS tablet Take 1 tablet by mouth daily.    [provider]  nitroGLYCERIN (NITROSTAT) 0.4 MG SL tablet Place 1 tablet (0.4 mg total) under the tongue every 5 (five) minutes as needed for chest pain. 01/06/17   [name], MD  predniSONE (DELTASONE) 10 MG tablet Take  two tablets daily x 3 days, then take 1 tablet daily x 3 days, then 1/2 tablet daily x 3 days, then stop 09/03/21   Borders, Kirt Boys, NP  Tiotropium Bromide Monohydrate 2.5 MCG/ACT AERS Inhale 2 puffs into the lungs at bedtime. Spiriva 08/07/20   [provider]  traMADol (ULTRAM) 50 MG tablet Take 1 tablet (50 mg total) by mouth every 12 (twelve) hours as needed. 08/01/21   Cammie Sickle, MD  venetoclax (VENCLEXTA) 100 MG tablet TAKE 1 TABLET BY MOUTH ONCE DAILY 08/26/21 08/26/22  Cammie Sickle, MD   Physical Exam: Vitals:   09/03/21 1731 09/03/21 2004 09/03/21 2230 09/03/21 2300  BP:  137/66 (!) 130/114 121/60  Pulse:  68 65 68  Resp:  18  18  Temp:      TempSrc:      SpO2:  95% 94% 95%  Weight: 99.8 kg     Height: 5\' 9"  (1.753 m)      Constitutional: appears age-appropriate, NAD, calm, comfortable Eyes: PERRL, lids and conjunctivae normal ENMT: Mucous membranes are moist. Posterior pharynx clear of any exudate or lesions. Age-appropriate dentition. Hearing appropriate Neck: normal, supple, no masses, no thyromegaly Respiratory: clear to auscultation bilaterally, no wheezing, no crackles. Normal respiratory effort. No accessory muscle use.  Cardiovascular: Regular rate and rhythm, no murmurs / rubs / gallops. No extremity edema. 2+ pedal pulses. No carotid bruits.  Abdomen: no tenderness, no masses palpated, no hepatosplenomegaly. Bowel sounds positive.   Musculoskeletal: no clubbing / cyanosis. No joint deformity upper and lower extremities. Good ROM, no contractures, no atrophy. Normal muscle tone.  Skin: no rashes, lesions, ulcers. No induration Neurologic: Sensation intact. Strength 5/5 in all 4.  Psychiatric: Normal judgment and insight. Alert and oriented x 3. Normal mood.   EKG: independently reviewed, showing sinus rhythm with rate of 82, QTc 514.  First-degree AV block.  Chest x-ray on Admission: I personally reviewed and I agree with radiologist reading as below.  DG Chest 2 View  Result Date: 09/03/2021 CLINICAL DATA:  Chest pain, shortness of breath EXAM: CHEST - 2 VIEW COMPARISON:  CT chest dated May 27, 2021 FINDINGS: The heart is normal in size. Pacemaker leads terminating in the right atrium and right ventricle. 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 Atherosclerotic calcification of the aortic arch. Low lung volumes with left basilar atelectasis. No evidence of pneumonia, pulmonary edema or pleural effusion. No acute osseous abnormality. IMPRESSION: No acute cardiopulmonary process. Electronically Signed   By: Keane Police D.O.   On: 09/03/2021 18:19    Labs on Admission: I have personally reviewed following labs  CBC: Recent Labs  Lab 09/03/21 1735  WBC 7.2  HGB 12.4*  HCT 36.9*  MCV 102.5*  PLT 161*   Basic Metabolic Panel: Recent Labs  Lab 09/03/21 1735 09/03/21 2039  NA 137  --   K 3.5  --   CL 107  --   CO2 23  --   GLUCOSE 140*  --   BUN 19  --   CREATININE 0.90  --   CALCIUM 8.6*  --   MG  --  2.0   GFR: Estimated Creatinine Clearance: 81.3 mL/min (by C-G formula based on SCr of 0.9 mg/dL).  Urine analysis:    Component Value Date/Time   COLORURINE YELLOW (A) 09/16/2018 1252   APPEARANCEUR CLEAR (A) 09/16/2018 1252   APPEARANCEUR Clear 10/11/2014 2003   LABSPEC 1.013 09/16/2018 1252   LABSPEC 1.017 10/11/2014 2003  PHURINE 6.0 09/16/2018 [place] 09/16/2018 1252   GLUCOSEU Negative 10/11/2014 2003   HGBUR MODERATE (A) 09/16/2018 1252   BILIRUBINUR NEGATIVE 09/16/2018 1252   BILIRUBINUR Negative 10/11/2014 2003   KETONESUR NEGATIVE 09/16/2018 1252   PROTEINUR NEGATIVE 09/16/2018 1252   UROBILINOGEN 1.0 05/12/2009 1426   NITRITE NEGATIVE 09/16/2018 [place] 09/16/2018 1252   LEUKOCYTESUR Negative 10/11/2014 2003   CRITICAL CARE Performed by: Briant Cedar Jamye Balicki  Total critical care time: 35 minutes  Critical care time was exclusive of separately billable procedures and treating other patients.  Critical care was necessary to treat or prevent imminent or life-threatening deterioration.  Critical care was time spent personally by me on the following activities: development of treatment plan with patient and/or surrogate as well as nursing, discussions with consultants, evaluation of patient's response to treatment, examination of patient, obtaining history from patient or surrogate, ordering and performing treatments and interventions, ordering and review of laboratory studies, ordering and review of radiographic studies, pulse oximetry and re-evaluation of patient's condition.  Dr. Tobie Poet [facility]  If 7PM-7AM, please contact overnight-coverage provider If 7AM-7PM, please contact day coverage provider [email or web address]  09/04/2021, 1:07 AM

## 2021-09-04 NOTE — Hospital Course (Signed)
76 year old male with history of COPD, OSA on CPAP, hypertension, hyperlipidemia, CAD status post CABG, CLL, depression, who presents emergency department for chief concerns of shortness of breath and chest pain.  Vitals in the emergency department showed temperature of 99.4, respiration rate of 20, heart heart rate 79, blood pressure 122/58, SPO2 94% on room air.  Serum sodium 137, potassium 3.5, chloride 107, bicarb 23, nonfasting blood glucose 140, BUN 19, serum creatinine of 0.90, GFR greater than 60.  WBC 7.2, hemoglobin 12.5, platelets of 126.  High-sensitivity troponin was 15 and decreased to 12.  ED treatment: Solu-Medrol, DuoNebs.

## 2021-09-04 NOTE — Assessment & Plan Note (Signed)
-   Presumed secondary to covid

## 2021-09-04 NOTE — Progress Notes (Signed)
[facility] Progress Note  Patient: [name]    JTT:[id number]  DOA: 09/03/2021     Date of Service: the patient was seen and examined on 09/04/2021  Chief Complaint  Patient presents with   Shortness of Breath   Chest Pain   Brief hospital course: 76 year old male with history of COPD, OSA on CPAP, hypertension, hyperlipidemia, CAD status post CABG, CLL, depression, who presents emergency department for chief concerns of shortness of breath and chest pain.   Vitals in the emergency department showed temperature of 99.4, respiration rate of 20, heart heart rate 79, blood pressure 122/58, SPO2 94% on room air.   Serum sodium 137, potassium 3.5, chloride 107, bicarb 23, nonfasting blood glucose 140, BUN 19, serum creatinine of 0.90, GFR greater than 60.  WBC 7.2, hemoglobin 12.5, platelets of 126.  High-sensitivity troponin was 15 and decreased to 12.   ED treatment: Solu-Medrol, DuoNebs.   At bedside, he is able to tell me his name, age, and he knows he is in the hospital. He states he tested positive for covid on Sunday and developed worsening shortness of breath on Monday. He denies nausea, vomiting, diarrhea. This morning he checked his pulse ox and it was 91 and they rechecked it later and it was 86% on room air.    He reports for the last two days, he has felt so weak, he can not walk.    Assessment and Plan: Principal Problem:   COPD exacerbation ([place]) Active Problems:   Hyperlipidemia   Essential hypertension   Paroxysmal atrial fibrillation (HCC)   CLL (chronic lymphocytic leukemia) (HCC)   CAD (coronary artery disease)   Depression   Cardiomyopathy ([place])   Weakness   Acute respiratory failure with hypoxia ([place])   COVID-19 virus infection    COVID-19 virus infection Patient states that he tested positive for COVID-19 infection at home on 09/01/2021 Discussed with patient that given development of hypoxia at home,  Remdesivir per pharmacy  ordered Tussionex as needed for cough Incentive spirometry and flutter valve ordered Continue contact and airborne precaution   COPD exacerbation  - Presumed secondary to COVID-19 infection - DuoNebs 3 times daily - Solu-Medrol 80 IV, followed by prednisone 40 mg p.o. daily for 3 days   CAD (coronary artery disease) Continue atorvastatin 80 mg nightly - Nitroglycerin sublingual   Paroxysmal atrial fibrillation (HCC)  Resumed home amiodarone, and apixaban 5 mg twice daily   Essential hypertension Hydralazine 10 mg p.o. every 6 hours as needed for SBP greater than 180, 4 doses ordered Continue Imdur  Acute respiratory failure with hypoxia ([place]) Per patient he had transient hypoxia at home, pulse ox reading as low as 86% on room air Presumed secondary to COVID-19 infection Continue oxygen supplementation to maintain SPO2 greater than 92% Treat as above     Weakness Presumed secondary to covid   Hyperlipidemia Atorvastatin 80 mg daily and Zetia    Diet: Heart healthy DVT Prophylaxis: Therapeutic Anticoagulation with Eliquis    Advance goals of care discussion: Full code  Family Communication: family was NOT present at bedside, at the time of interview.  The pt provided permission to discuss medical plan with the family. Opportunity was given to ask question and all questions were answered satisfactorily.   Disposition:  Pt is from Home, admitted with acute hypoxic respiratory failure, COVID-19 viral infection, still has shortness of breath and cough, which precludes a safe discharge. Discharge to home, when clinically improved, most likely in 1  to 2 days.  Subjective: No significant overnight events, patient still has generalized weakness and body ache which is gradually improving, oxygen is improving as well currently saturating well on room air. Patient was complaining of lower chest wall and abdominal aches during coughing otherwise he is feeling fine.  Physical  Exam: General:  alert oriented to time, place, and person.  Appear in mild distress, affect appropriate Eyes: PERRLA ENT: Oral Mucosa Clear, moist  Neck: no JVD,  Cardiovascular: S1 and S2 Present, no Murmur,  Respiratory: good respiratory effort, Bilateral Air entry equal and Decreased, mild Crackles, mild wheezes Abdomen: Bowel Sound present, Soft and no tenderness,  Skin: no rashes Extremities: no Pedal edema, no calf tenderness Neurologic: without any new focal findings Gait not checked due to patient safety concerns  Vitals:   09/04/21 0330 09/04/21 0506 09/04/21 0751 09/04/21 1124  BP: 125/63 129/69 127/76 121/72  Pulse: 73 71 69 74  Resp: 18 20 16 16   Temp:  98 F (36.7 C) 97.7 F (36.5 C) 97.8 F (36.6 C)  TempSrc:  Oral    SpO2: 98% 98% 100% 96%  Weight:      Height:        Intake/Output Summary (Last 24 hours) at 09/04/2021 1513 Last data filed at 09/04/2021 1141 Gross per 24 hour  Intake 300 ml  Output 200 ml  Net 100 ml   Filed Weights   09/03/21 1731  Weight: 99.8 kg    Data Reviewed: I have personally reviewed and interpreted daily labs, tele strips, imagings as discussed above. I reviewed all nursing notes, pharmacy notes, vitals, pertinent old records I have discussed plan of care as described above with RN and patient/family.  CBC: Recent Labs  Lab 09/03/21 1735 09/04/21 0646  WBC 7.2 3.5*  HGB 12.4* 12.0*  HCT 36.9* 35.0*  MCV 102.5* 100.9*  PLT 126* 161*   Basic Metabolic Panel: Recent Labs  Lab 09/03/21 1735 09/03/21 2039  NA 137  --   K 3.5  --   CL 107  --   CO2 23  --   GLUCOSE 140*  --   BUN 19  --   CREATININE 0.90  --   CALCIUM 8.6*  --   MG  --  2.0    Studies: DG Chest 2 View  Result Date: 09/03/2021 CLINICAL DATA:  Chest pain, shortness of breath EXAM: CHEST - 2 VIEW COMPARISON:  CT chest dated May 27, 2021 FINDINGS: The heart is normal in size. Pacemaker leads terminating in the right atrium and right  ventricle. 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 Atherosclerotic calcification of the aortic arch. Low lung volumes with left basilar atelectasis. No evidence of pneumonia, pulmonary edema or pleural effusion. No acute osseous abnormality. IMPRESSION: No acute cardiopulmonary process. Electronically Signed   By: Keane Police D.O.   On: 09/03/2021 18:19    Scheduled Meds:  amiodarone  100 mg Oral 2 times per day on Mon Tue Wed Thu Fri   apixaban  5 mg Oral BID   atorvastatin  80 mg Oral Daily   ezetimibe  10 mg Oral Daily   Ipratropium-Albuterol  1 puff Inhalation TID   isosorbide mononitrate  30 mg Oral Daily   [START ON 09/05/2021] methylPREDNISolone (SOLU-MEDROL) injection  80 mg Intravenous Daily   Followed by   Derrill Memo ON 09/05/2021] predniSONE  40 mg Oral Q breakfast   mometasone-formoterol  2 puff Inhalation BID   multivitamin with minerals  1  tablet Oral Daily   Continuous Infusions:  remdesivir 100 mg in NS 100 mL 100 mg (09/04/21 1040)   PRN Meds: acetaminophen **OR** acetaminophen, benzonatate, chlorpheniramine-HYDROcodone, hydrALAZINE, mirtazapine, nitroGLYCERIN, ondansetron **OR** ondansetron (ZOFRAN) IV, traMADol  Time spent: 35 minutes  Author: Val Riles. MD [facility] 09/04/2021 3:13 PM  To reach On-call, see care teams to locate the attending and reach out to them via [email or web address]. If 7PM-7AM, please contact night-coverage If you still have difficulty reaching the attending provider, please page the [facility] (Director on Call) for [facility] on amion for assistance.

## 2021-09-04 NOTE — Assessment & Plan Note (Addendum)
-   Patient states that he tested positive for COVID-19 infection at home on 09/01/2021 - Discussed with patient that given development of hypoxia at home, I recommended starting patient on remdesivir and he agreed - Remdesivir per pharmacy ordered - Tussionex as needed for cough - Incentive spirometry and flutter valve ordered - Continue contact and airborne precaution

## 2021-09-04 NOTE — Assessment & Plan Note (Signed)
-   Presumed secondary to COVID-19 infection - DuoNebs 3 times daily - Solu-Medrol 80 IV -Telemetry medical observation

## 2021-09-04 NOTE — Assessment & Plan Note (Signed)
-   Resumed home apixaban 5 mg twice daily

## 2021-09-04 NOTE — Assessment & Plan Note (Signed)
-   Atorvastatin 80 mg daily 

## 2021-09-04 NOTE — Assessment & Plan Note (Signed)
-   Atorvastatin 80 mg nightly - Nitroglycerin sublingual

## 2021-09-05 DIAGNOSIS — J441 Chronic obstructive pulmonary disease with (acute) exacerbation: Secondary | ICD-10-CM | POA: Diagnosis not present

## 2021-09-05 LAB — COMPREHENSIVE METABOLIC PANEL
ALT: 24 U/L (ref 0–44)
AST: 32 U/L (ref 15–41)
Albumin: 3.6 g/dL (ref 3.5–5.0)
Alkaline Phosphatase: 63 U/L (ref 38–126)
Anion gap: 7 (ref 5–15)
BUN: 27 mg/dL — ABNORMAL HIGH (ref 8–23)
CO2: 26 mmol/L (ref 22–32)
Calcium: 8.4 mg/dL — ABNORMAL LOW (ref 8.9–10.3)
Chloride: 106 mmol/L (ref 98–111)
Creatinine, Ser: 0.99 mg/dL (ref 0.61–1.24)
GFR, Estimated: >60 mL/min (ref >60)
Glucose, Bld: 193 mg/dL — ABNORMAL HIGH (ref 70–99)
Potassium: 4.2 mmol/L (ref 3.5–5.1)
Sodium: 139 mmol/L (ref 135–145)
Total Bilirubin: 0.8 mg/dL (ref 0.3–1.2)
Total Protein: 6 g/dL — ABNORMAL LOW (ref 6.5–8.1)

## 2021-09-05 LAB — BLOOD GAS, ARTERIAL
Bicarbonate: 23.4 mmol/L (ref 20.0–28.0)
FIO2: 0.21 %
O2 Saturation: 99.8 %
Patient temperature: 37
pCO2 arterial: 28 mmHg — ABNORMAL LOW (ref 32–48)
pH, Arterial: 7.53 — ABNORMAL HIGH (ref 7.35–7.45)
pO2, Arterial: 173 mmHg — ABNORMAL HIGH (ref 83–108)

## 2021-09-05 LAB — C-REACTIVE PROTEIN: CRP: 1.7 mg/dL — ABNORMAL HIGH (ref <1.0)

## 2021-09-05 LAB — D-DIMER, QUANTITATIVE: D-Dimer, Quant: 0.4 ug/mL-FEU (ref 0.00–0.50)

## 2021-09-05 LAB — MAGNESIUM: Magnesium: 2.4 mg/dL (ref 1.7–2.4)

## 2021-09-05 LAB — PHOSPHORUS: Phosphorus: 3.7 mg/dL (ref 2.5–4.6)

## 2021-09-05 MED ORDER — GUAIFENESIN ER 600 MG PO TB12
600.0000 mg | ORAL_TABLET | Freq: Two times a day (BID) | ORAL | Status: DC
  Administered 2021-09-05 – 2021-09-06 (×2): 600 mg via ORAL
  Filled 2021-09-05 (×2): qty 1

## 2021-09-05 MED ORDER — PANTOPRAZOLE SODIUM 40 MG PO TBEC
40.0000 mg | DELAYED_RELEASE_TABLET | Freq: Every day | ORAL | Status: DC
  Administered 2021-09-05 – 2021-09-06 (×2): 40 mg via ORAL
  Filled 2021-09-05 (×2): qty 1

## 2021-09-05 NOTE — Progress Notes (Signed)
[facility] Progress Note  Patient: [name]    GEX:[id number]  DOA: 09/03/2021     Date of Service: the patient was seen and examined on 09/05/2021  Chief Complaint  Patient presents with   Shortness of Breath   Chest Pain   Brief hospital course: 76 year old male with history of COPD, OSA on CPAP, hypertension, hyperlipidemia, CAD status post CABG, CLL, depression, who presents emergency department for chief concerns of shortness of breath and chest pain.   Vitals in the emergency department showed temperature of 99.4, respiration rate of 20, heart heart rate 79, blood pressure 122/58, SPO2 94% on room air.   Serum sodium 137, potassium 3.5, chloride 107, bicarb 23, nonfasting blood glucose 140, BUN 19, serum creatinine of 0.90, GFR greater than 60.  WBC 7.2, hemoglobin 12.5, platelets of 126.  High-sensitivity troponin was 15 and decreased to 12.   ED treatment: Solu-Medrol, DuoNebs.   At bedside, he is able to tell me his name, age, and he knows he is in the hospital. He states he tested positive for covid on Sunday and developed worsening shortness of breath on Monday. He denies nausea, vomiting, diarrhea. This morning he checked his pulse ox and it was 91 and they rechecked it later and it was 86% on room air.    He reports for the last two days, he has felt so weak, he can not walk.    Assessment and Plan: Principal Problem:   COPD exacerbation ([place]) Active Problems:   Hyperlipidemia   Essential hypertension   Paroxysmal atrial fibrillation (HCC)   CLL (chronic lymphocytic leukemia) (HCC)   CAD (coronary artery disease)   Depression   Cardiomyopathy ([place])   Weakness   Acute respiratory failure with hypoxia ([place])   COVID-19 virus infection    COVID-19 virus infection Patient states that he tested positive for COVID-19 infection at home on 09/01/2021 Discussed with patient that given development of hypoxia at home,  Remdesivir per pharmacy ordered Mucinex  600 twice daily Tussionex as needed for cough Incentive spirometry and flutter valve ordered Continue contact and airborne precaution Started pantoprazole 40 mg p.o. daily for GI prophylaxis   COPD exacerbation  - Presumed secondary to COVID-19 infection - DuoNebs 3 times daily - Solu-Medrol 80 IV, followed by prednisone 40 mg p.o. daily for 4 days   CAD (coronary artery disease) Continue atorvastatin 80 mg nightly - Nitroglycerin sublingual   Paroxysmal atrial fibrillation (HCC)  Resumed home amiodarone, and apixaban 5 mg twice daily   Essential hypertension Hydralazine 10 mg p.o. every 6 hours as needed for SBP greater than 180, 4 doses ordered Continue Imdur  Acute respiratory failure with hypoxia ([place]) Per patient he had transient hypoxia at home, pulse ox reading as low as 86% on room air Presumed secondary to COVID-19 infection Continue oxygen supplementation to maintain SPO2 greater than 92% Treat as above     Weakness Presumed secondary to covid   Hyperlipidemia Atorvastatin 80 mg daily and Zetia    Diet: Heart healthy DVT Prophylaxis: Therapeutic Anticoagulation with Eliquis    Advance goals of care discussion: Full code  Family Communication: family was NOT present at bedside, at the time of interview.  The pt provided permission to discuss medical plan with the family. Opportunity was given to ask question and all questions were answered satisfactorily.   Disposition:  Pt is from Home, admitted with acute hypoxic respiratory failure, COVID-19 viral infection, still has shortness of breath and cough, which precludes  a safe discharge. Discharge to home, when clinically improved, most likely in 1 to 2 days.  Subjective: No significant overnight events, in the morning time patient was having an episode of PTSD which has been resolved.  Patient still has cough and dry throat, feels generalized weakness.  Patient is not comfortable to go home today, we will plan  to discharge him tomorrow a.m. if remains stable.   Physical Exam: General:  alert oriented to time, place, and person.  Appear in mild distress, affect appropriate Eyes: PERRLA ENT: Oral Mucosa Clear, moist  Neck: no JVD,  Cardiovascular: S1 and S2 Present, no Murmur,  Respiratory: good respiratory effort, Bilateral Air entry equal and Decreased, mild Crackles, no wheezes Abdomen: Bowel Sound present, Soft and no tenderness,  Skin: no rashes Extremities: no Pedal edema, no calf tenderness Neurologic: without any new focal findings Gait not checked due to patient safety concerns  Vitals:   09/04/21 2352 09/05/21 0411 09/05/21 0728 09/05/21 1103  BP: 130/73 (!) 101/53 103/61 (!) 114/57  Pulse: 60 60 61 62  Resp: 18 18 16 16   Temp: 98.5 F (36.9 C) 98.7 F (37.1 C) 97.6 F (36.4 C) 98.6 F (37 C)  TempSrc:      SpO2: 100% 96% 96% 96%  Weight:      Height:        Intake/Output Summary (Last 24 hours) at 09/05/2021 1505 Last data filed at 09/04/2021 1700 Gross per 24 hour  Intake 97.55 ml  Output --  Net 97.55 ml   Filed Weights   09/03/21 1731  Weight: 99.8 kg    Data Reviewed: I have personally reviewed and interpreted daily labs, tele strips, imagings as discussed above. I reviewed all nursing notes, pharmacy notes, vitals, pertinent old records I have discussed plan of care as described above with RN and patient/family.  CBC: Recent Labs  Lab 09/03/21 1735 09/04/21 0646  WBC 7.2 3.5*  HGB 12.4* 12.0*  HCT 36.9* 35.0*  MCV 102.5* 100.9*  PLT 126* 621*   Basic Metabolic Panel: Recent Labs  Lab 09/03/21 1735 09/03/21 2039 09/05/21 0611  NA 137  --  139  K 3.5  --  4.2  CL 107  --  106  CO2 23  --  26  GLUCOSE 140*  --  193*  BUN 19  --  27*  CREATININE 0.90  --  0.99  CALCIUM 8.6*  --  8.4*  MG  --  2.0 2.4  PHOS  --   --  3.7    Studies: No results found.  Scheduled Meds:  amiodarone  100 mg Oral 2 times per day on Mon Tue Wed Thu Fri    apixaban  5 mg Oral BID   atorvastatin  80 mg Oral Daily   ezetimibe  10 mg Oral Daily   isosorbide mononitrate  30 mg Oral Daily   mometasone-formoterol  2 puff Inhalation BID   multivitamin with minerals  1 tablet Oral Daily   predniSONE  40 mg Oral Q breakfast   Continuous Infusions:  remdesivir 100 mg in NS 100 mL 100 mg (09/05/21 0936)   PRN Meds: acetaminophen **OR** acetaminophen, benzonatate, chlorpheniramine-HYDROcodone, hydrALAZINE, mirtazapine, nitroGLYCERIN, ondansetron **OR** ondansetron (ZOFRAN) IV, traMADol  Time spent: 40 minutes  Author: Val Riles. MD [facility] 09/05/2021 3:05 PM  To reach On-call, see care teams to locate the attending and reach out to them via [email or web address]. If 7PM-7AM, please contact night-coverage If you still have difficulty reaching the  attending provider, please page the [facility] (Director on Call) for [facility] on amion for assistance.

## 2021-09-05 NOTE — Care Management Important Message (Signed)
Important Message  Patient Details  Name: [name] MRN: [id number] Date of Birth: July 22, 1945   Medicare Important Message Given:  N/A - LOS <3 / Initial given by admissions     Dannette Barbara 09/05/2021, 4:37 PM

## 2021-09-05 NOTE — Plan of Care (Signed)
°  Problem: Respiratory: °Goal: Ability to maintain a clear airway will improve °Outcome: Progressing °  °

## 2021-09-06 DIAGNOSIS — J441 Chronic obstructive pulmonary disease with (acute) exacerbation: Secondary | ICD-10-CM | POA: Diagnosis not present

## 2021-09-06 LAB — COMPREHENSIVE METABOLIC PANEL
ALT: 22 U/L (ref 0–44)
AST: 25 U/L (ref 15–41)
Albumin: 3.4 g/dL — ABNORMAL LOW (ref 3.5–5.0)
Alkaline Phosphatase: 61 U/L (ref 38–126)
Anion gap: 6 (ref 5–15)
BUN: 27 mg/dL — ABNORMAL HIGH (ref 8–23)
CO2: 25 mmol/L (ref 22–32)
Calcium: 8.3 mg/dL — ABNORMAL LOW (ref 8.9–10.3)
Chloride: 107 mmol/L (ref 98–111)
Creatinine, Ser: 0.92 mg/dL (ref 0.61–1.24)
GFR, Estimated: >60 mL/min (ref >60)
Glucose, Bld: 139 mg/dL — ABNORMAL HIGH (ref 70–99)
Potassium: 3.6 mmol/L (ref 3.5–5.1)
Sodium: 138 mmol/L (ref 135–145)
Total Bilirubin: 1 mg/dL (ref 0.3–1.2)
Total Protein: 5.5 g/dL — ABNORMAL LOW (ref 6.5–8.1)

## 2021-09-06 LAB — D-DIMER, QUANTITATIVE: D-Dimer, Quant: 0.36 ug/mL-FEU (ref 0.00–0.50)

## 2021-09-06 LAB — CBC
HCT: 33.3 % — ABNORMAL LOW (ref 39.0–52.0)
Hemoglobin: 11.2 g/dL — ABNORMAL LOW (ref 13.0–17.0)
MCH: 34.1 pg — ABNORMAL HIGH (ref 26.0–34.0)
MCHC: 33.6 g/dL (ref 30.0–36.0)
MCV: 101.5 fL — ABNORMAL HIGH (ref 80.0–100.0)
Platelets: 132 10*3/uL — ABNORMAL LOW (ref 150–400)
RBC: 3.28 MIL/uL — ABNORMAL LOW (ref 4.22–5.81)
RDW: 14 % (ref 11.5–15.5)
WBC: 10.4 10*3/uL (ref 4.0–10.5)
nRBC: 0 % (ref 0.0–0.2)

## 2021-09-06 LAB — C-REACTIVE PROTEIN: CRP: 0.8 mg/dL (ref <1.0)

## 2021-09-06 LAB — PHOSPHORUS: Phosphorus: 2.6 mg/dL (ref 2.5–4.6)

## 2021-09-06 LAB — MAGNESIUM: Magnesium: 2.3 mg/dL (ref 1.7–2.4)

## 2021-09-06 MED ORDER — PANTOPRAZOLE SODIUM 40 MG PO TBEC: 40.0000 mg | DELAYED_RELEASE_TABLET | Freq: Every day | ORAL | 0 refills | Status: DC

## 2021-09-06 MED ORDER — GUAIFENESIN-DM 100-10 MG/5ML PO SYRP: 5.0000 mL | ORAL_SOLUTION | ORAL | 0 refills | Status: DC | PRN

## 2021-09-06 MED ORDER — DEXAMETHASONE 6 MG PO TABS: 6.0000 mg | ORAL_TABLET | Freq: Every day | ORAL | 0 refills | Status: AC

## 2021-09-06 MED ORDER — POTASSIUM CHLORIDE CRYS ER 20 MEQ PO TBCR: 40.0000 meq | EXTENDED_RELEASE_TABLET | Freq: Once | ORAL | Status: DC

## 2021-09-06 NOTE — Discharge Summary (Signed)
[facility] Discharge Summary   Patient: [name] HUT:[id number]  PCP: Leone Haven, MD  Date of admission: 09/03/2021   Date of discharge:  09/06/2021     Discharge Diagnoses:  Principal Problem:   COPD exacerbation ([place]) Active Problems:   Hyperlipidemia   Essential hypertension   Paroxysmal atrial fibrillation (HCC)   CLL (chronic lymphocytic leukemia) ([place])   CAD (coronary artery disease)   Depression   Cardiomyopathy ([place])   Weakness   Acute respiratory failure with hypoxia ([place])   COVID-19 virus infection   Admitted From: Home Disposition:  Home   Recommendations for Outpatient Follow-up:  PCP: in 1 wk Follow up LABS/TEST: Repeat chest x-ray after 4 weeks for resolution of pneumonia   Diet recommendation: Cardiac diet  Activity: The patient is advised to gradually reintroduce usual activities, as tolerated  Discharge Condition: stable  Code Status: Full code   History of present illness: As per the H and P dictated on admission Hospital Course:  76 year old male with history of COPD, OSA on CPAP, hypertension, hyperlipidemia, CAD status post CABG, CLL, depression, who presents emergency department for chief concerns of shortness of breath and chest pain. Vitals in the emergency department showed temperature of 99.4, respiration rate of 20, heart heart rate 79, blood pressure 122/58, SPO2 94% on room air. Serum sodium 137, potassium 3.5, chloride 107, bicarb 23, nonfasting blood glucose 140, BUN 19, serum creatinine of 0.90, GFR greater than 60.  WBC 7.2, hemoglobin 12.5, platelets of 126. High-sensitivity troponin was 15 and decreased to 12. ED treatment: Solu-Medrol, DuoNebs. At bedside, he is able to tell me his name, age, and he knows he is in the hospital. He states he tested positive for covid on Sunday and developed worsening shortness of breath on Monday. He denies nausea, vomiting, diarrhea. This morning he checked his pulse ox and it was 91  and they rechecked it later and it was 86% on room air.  He reports for the last two days, he has felt so weak, he can not walk.   Assessment and Plan: Principal Problem: COVID-19 virus infection   COPD exacerbation ([place]) Active Problems:   Hyperlipidemia   Essential hypertension   Paroxysmal atrial fibrillation (HCC)   CLL (chronic lymphocytic leukemia) (HCC)   CAD (coronary artery disease)   Depression   Cardiomyopathy ([place])   Weakness   Acute respiratory failure with hypoxia ([place])       COVID-19 virus infection Patient states that he tested positive for COVID-19 infection at home on 09/01/2021 Discussed with patient that given development of hypoxia at home, s/p Remdesivir per pharmacy given for 4 days, s/p Mucinex 600 twice daily and Tussionex as needed for cough Incentive spirometry and flutter valve was given.  Patient was on advanced droplet and contact precautions, recommended to continue precautions for 10 days.  Patient was discharged on Robitussin DM as needed, Decadron 6 mg p.o. daily for 5 days and pantoprazole 40 mg p.o. daily for GI prophylaxis.   COPD exacerbation  Presumed secondary to COVID-19 infection, s/p DuoNebs 3 times daily and Solu-Medrol 80 IV, followed by prednisone 40 mg p.o. daily for 4 days.  Breathing improved, saturating well on room air.  Discharged on Decadron 6 mg p.o. daily for 5 days.  Continue inhalers. CAD (coronary artery disease), Continue atorvastatin 80 mg nightly, no active issues Paroxysmal atrial fibrillation,  Resumed home amiodarone, and apixaban 5 mg twice daily Essential hypertension, resumed home medications, patient was advised to monitor  BP at home and follow with PCP. Acute respiratory failure with hypoxia, Per patient he had transient hypoxia at home, pulse ox reading as low as 86% on room air, Presumed secondary to COVID-19 infection.  Supplemental O2 admission was gradually weaned off, currently patient is saturating well on room  air.  Respiratory failure resolved. Weakness, Presumed secondary to covid.  Patient is feeling improvement in the generalized weakness, able to ambulate in the room.  Patient agreed for the discharge planning today. Hyperlipidemia, Atorvastatin 80 mg daily and Zetia History of CLL, patient was advised to continue to follow with oncology as an outpatient.  Body mass index is 32.49 kg/m.  Nutrition Interventions:   - Patient was instructed, not to drive, operate heavy machinery, perform activities at heights, swimming or participation in water activities or provide baby sitting services while on Pain, Sleep and Anxiety Medications; until his outpatient Physician has advised to do so again.  - Also recommended to not to take more than prescribed Pain, Sleep and Anxiety Medications.  Patient was ambulatory without any assistance. On the day of the discharge the patient's vitals were stable, and no other acute medical condition were reported by patient. the patient was felt safe to be discharge at Home.  Consultants: None, seen by oncologist for continued T of care Procedures: None  Discharge Exam: General: Appear in no distress, no Rash; Oral Mucosa Clear, moist. Cardiovascular: S1 and S2 Present, no Murmur, Respiratory: normal respiratory effort, Bilateral Air entry present and mild Crackles, no wheezes Abdomen: Bowel Sound present, Soft and no tenderness, no hernia Extremities: no Pedal edema, no calf tenderness Neurology: alert and oriented to time, place, and person affect appropriate.  Filed Weights   09/03/21 1731  Weight: 99.8 kg   Vitals:   09/06/21 0333 09/06/21 0820  BP: 120/72 (!) 143/64  Pulse: 62 63  Resp: 14 18  Temp: 98.7 F (37.1 C) (!) 97.4 F (36.3 C)  SpO2: 96% 97%    DISCHARGE MEDICATION: Allergies as of 09/06/2021   No Known Allergies      Medication List     STOP taking these medications    fluticasone-salmeterol 100-50 MCG/ACT Aepb Commonly  known as: ADVAIR   molnupiravir EUA 200 mg Caps capsule Commonly known as: LAGEVRIO   predniSONE 10 MG tablet Commonly known as: DELTASONE       TAKE these medications    acetaminophen 500 MG tablet Commonly known as: TYLENOL Take 1,000 mg by mouth every 8 (eight) hours as needed for mild pain.   acyclovir 400 MG tablet Commonly known as: ZOVIRAX TAKE 1 TABLET(400 MG) BY MOUTH TWICE DAILY   albuterol 108 (90 Base) MCG/ACT inhaler Commonly known as: VENTOLIN HFA Inhale 2 puffs into the lungs every 6 (six) hours as needed for wheezing or shortness of breath.   amiodarone 200 MG tablet Commonly known as: PACERONE Take 1/2 tablet (100 mg) by mouth twice daily 5 days a week   apixaban 5 MG Tabs tablet Commonly known as: ELIQUIS Take 5 mg by mouth 2 (two) times daily.   atorvastatin 80 MG tablet Commonly known as: LIPITOR TAKE ONE TABLET BY MOUTH AT BEDTIME FOR CHOLESTEROL   azelastine 0.1 % nasal spray Commonly known as: ASTELIN Place 2 sprays into both nostrils 2 (two) times daily. Use in each nostril as directed   benzonatate 100 MG capsule Commonly known as: TESSALON Take 1 capsule (100 mg total) by mouth 3 (three) times daily as needed for cough.  budesonide-formoterol 160-4.5 MCG/ACT inhaler Commonly known as: SYMBICORT Inhale 2 puffs into the lungs 2 (two) times daily.   cetirizine 10 MG tablet Commonly known as: ZYRTEC Take 10 mg by mouth daily as needed for allergies.   CoQ10 200 MG Caps Take 200 mg by mouth daily.   dexamethasone 6 MG tablet Commonly known as: DECADRON Take 1 tablet (6 mg total) by mouth daily after lunch for 5 days.   ezetimibe 10 MG tablet Commonly known as: ZETIA Take 1 tablet (10 mg total) by mouth daily.   furosemide 20 MG tablet Commonly known as: LASIX Take 2 tablets (40 mg total) by mouth daily. Do not take on Thursdays   guaiFENesin-dextromethorphan 100-10 MG/5ML syrup Commonly known as: ROBITUSSIN DM Take 5 mLs by  mouth every 4 (four) hours as needed for cough.   isosorbide mononitrate 30 MG 24 hr tablet Commonly known as: IMDUR Take 30 mg by mouth daily.   Krill Oil 350 MG Caps Take 350 mg by mouth daily as needed (Heart).   mirtazapine 15 MG tablet Commonly known as: REMERON Take 15 mg by mouth at bedtime as needed (for panic associated with PTSD).   multivitamin with minerals Tabs tablet Take 1 tablet by mouth daily.   nitroGLYCERIN 0.4 MG SL tablet Commonly known as: NITROSTAT Place 1 tablet (0.4 mg total) under the tongue every 5 (five) minutes as needed for chest pain.   pantoprazole 40 MG tablet Commonly known as: PROTONIX Take 1 tablet (40 mg total) by mouth daily for 10 days. Start taking on: September 07, 2021   Praluent 150 MG/ML Soaj Generic drug: Alirocumab Inject 150 mg into the skin every 14 (fourteen) days.   Tiotropium Bromide Monohydrate 2.5 MCG/ACT Aers Inhale 2 puffs into the lungs at bedtime. Spiriva   traMADol 50 MG tablet Commonly known as: ULTRAM Take 1 tablet (50 mg total) by mouth every 12 (twelve) hours as needed.   Venclexta 100 MG tablet Generic drug: venetoclax TAKE 1 TABLET BY MOUTH ONCE DAILY       No Known Allergies Discharge Instructions     Call MD for:  difficulty breathing, headache or visual disturbances   Complete by: As directed    Call MD for:  persistant dizziness or light-headedness   Complete by: As directed    Call MD for:  severe uncontrolled pain   Complete by: As directed    Call MD for:  temperature >100.4   Complete by: As directed    Diet - low sodium heart healthy   Complete by: As directed    Discharge instructions   Complete by: As directed    Follow-up with PCP in 1 week, repeat chest x-ray after 4 weeks for resolution of pneumonia   Increase activity slowly   Complete by: As directed        The results of significant diagnostics from this hospitalization (including imaging, microbiology, ancillary and  laboratory) are listed below for reference.    Significant Diagnostic Studies: DG Chest 2 View  Result Date: 09/03/2021 CLINICAL DATA:  Chest pain, shortness of breath EXAM: CHEST - 2 VIEW COMPARISON:  CT chest dated May 27, 2021 FINDINGS: The heart is normal in size. Pacemaker leads terminating in the right atrium and right ventricle. 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 Atherosclerotic calcification of the aortic arch. Low lung volumes with left basilar atelectasis. No evidence of pneumonia, pulmonary edema or pleural effusion. No acute osseous abnormality. IMPRESSION: No acute cardiopulmonary process.  Electronically Signed   By: Keane Police D.O.   On: 09/03/2021 18:19    Microbiology: Recent Results (from the past 240 hour(s))  Resp Panel by RT-PCR (Flu A&B, Covid) Nasopharyngeal Swab     Status: Abnormal   Collection Time: 09/04/21 12:15 AM   Specimen: Nasopharyngeal Swab; Nasopharyngeal(NP) swabs in vial transport medium  Result Value Ref Range Status   SARS Coronavirus 2 by RT PCR POSITIVE (A) NEGATIVE Final    Comment: (NOTE) SARS-CoV-2 target nucleic acids are DETECTED.  The SARS-CoV-2 RNA is generally detectable in upper respiratory specimens during the acute phase of infection. Positive results are indicative of the presence of the identified virus, but do not rule out bacterial infection or co-infection with other pathogens not detected by the test. Clinical correlation with patient history and other diagnostic information is necessary to determine patient infection status. The expected result is Negative.  Fact Sheet for Patients: EntrepreneurPulse.com.au  Fact Sheet for Healthcare Providers: IncredibleEmployment.be  This test is not yet approved or cleared by the [place] FDA and  has been authorized for detection and/or diagnosis of SARS-CoV-2 by FDA under an Emergency Use Authorization (EUA).   This EUA will remain in effect (meaning this test can be used) for the duration of  the COVID-19 declaration under Section 564(b)(1) of the A ct, 21 U.S.C. section 360bbb-3(b)(1), unless the authorization is terminated or revoked sooner.     Influenza A by PCR NEGATIVE NEGATIVE Final   Influenza B by PCR NEGATIVE NEGATIVE Final    Comment: (NOTE) The Xpert Xpress SARS-CoV-2/FLU/RSV plus assay is intended as an aid in the diagnosis of influenza from Nasopharyngeal swab specimens and should not be used as a sole basis for treatment. Nasal washings and aspirates are unacceptable for Xpert Xpress SARS-CoV-2/FLU/RSV testing.  Fact Sheet for Patients: EntrepreneurPulse.com.au  Fact Sheet for Healthcare Providers: IncredibleEmployment.be  This test is not yet approved or cleared by the [place] FDA and has been authorized for detection and/or diagnosis of SARS-CoV-2 by FDA under an Emergency Use Authorization (EUA). This EUA will remain in effect (meaning this test can be used) for the duration of the COVID-19 declaration under Section 564(b)(1) of the Act, 21 U.S.C. section 360bbb-3(b)(1), unless the authorization is terminated or revoked.  Performed at [facility], [address] [place], [place] [address]      Labs: CBC: Recent Labs  Lab 09/03/21 1735 09/04/21 0646 09/06/21 0512  WBC 7.2 3.5* 10.4  HGB 12.4* 12.0* 11.2*  HCT 36.9* 35.0* 33.3*  MCV 102.5* 100.9* 101.5*  PLT 126* 117* 416*   Basic Metabolic Panel: Recent Labs  Lab 09/03/21 1735 09/03/21 2039 09/05/21 0611 09/06/21 0512  NA 137  --  139 138  K 3.5  --  4.2 3.6  CL 107  --  106 107  CO2 23  --  26 25  GLUCOSE 140*  --  193* 139*  BUN 19  --  27* 27*  CREATININE 0.90  --  0.99 0.92  CALCIUM 8.6*  --  8.4* 8.3*  MG  --  2.0 2.4 2.3  PHOS  --   --  3.7 2.6   Liver Function Tests: Recent Labs  Lab 09/05/21 0611 09/06/21 0512  AST 32 25   ALT 24 22  ALKPHOS 63 61  BILITOT 0.8 1.0  PROT 6.0* 5.5*  ALBUMIN 3.6 3.4*   No results for input(s): LIPASE, AMYLASE in the last 168 hours. No results for input(s): AMMONIA in  the last 168 hours. Cardiac Enzymes: No results for input(s): CKTOTAL, CKMB, CKMBINDEX, TROPONINI in the last 168 hours. BNP (last 3 results) No results for input(s): BNP in the last 8760 hours. CBG: No results for input(s): GLUCAP in the last 168 hours.  Time spent: 35 minutes  Signed:  Val Riles  [facility]  09/06/2021 12:15 PM

## 2021-09-09 ENCOUNTER — Telehealth: Payer: Self-pay

## 2021-09-09 NOTE — Telephone Encounter (Signed)
Transition Care Management Unsuccessful Follow-up Telephone Call  Date of discharge and from where:  09/06/21 [facility]  Attempts:  1st Attempt  Reason for unsuccessful TCM follow-up call:  Unable to reach patient

## 2021-09-10 NOTE — Telephone Encounter (Signed)
Okay to follow-up as scheduled as long as he continues to improve.

## 2021-09-10 NOTE — Telephone Encounter (Signed)
Transition Care Management Follow-up Telephone Call Date of discharge and from where: 09/06/21 [facility] How have you been since you were released from the hospital? Cpap in use. Tired and weak but improving. Pacing self with activity. Denies chest pain, fever, nausea, vomiting, diarrhea and all other alarming symptoms. Oxygen room air 95%.  Any questions or concerns? No  Items Reviewed: Did the pt receive and understand the discharge instructions provided? Yes  Medications obtained and verified? Yes  Any new allergies since your discharge? No  Dietary orders reviewed? Yes, cardiac diet Do you have support at home? Yes   Home Care and Equipment/Supplies: Were home health services ordered?   Functional Questionnaire: (I = Independent and D = Dependent) ADLs: I  Bathing/Dressing- I  Meal Prep- I  Eating- I  Maintaining continence- I  Transferring/Ambulation- Cane/walker/scooter  Managing Meds- I  Follow up appointments reviewed:  PCP Hospital f/u appt confirmed? Yes  Scheduled to see PCP on 3/8 @ 11:30, first available appointment. Patient okay to come sooner as PCP directs.  [facility] f/u appt confirmed? Yes  Scheduled to see Pulmonary on 2/28 and Cardiology 3/1.  Are transportation arrangements needed? No  If their condition worsens, is the pt aware to call PCP or go to the Emergency Dept.? Yes Was the patient provided with contact information for the PCP's office or ED? Yes Was to pt encouraged to call back with questions or concerns? Yes

## 2021-09-12 ENCOUNTER — Other Ambulatory Visit (HOSPITAL_COMMUNITY): Payer: Self-pay

## 2021-09-13 ENCOUNTER — Emergency Department: Payer: Medicare HMO

## 2021-09-13 ENCOUNTER — Other Ambulatory Visit: Payer: Self-pay

## 2021-09-13 ENCOUNTER — Telehealth: Payer: Self-pay

## 2021-09-13 ENCOUNTER — Inpatient Hospital Stay
Admission: EM | Admit: 2021-09-13 | Discharge: 2021-09-19 | DRG: 871 | Disposition: A | Discharge status: Skilled Nursing Facility | Payer: Medicare HMO | Attending: Internal Medicine | Admitting: Internal Medicine

## 2021-09-13 ENCOUNTER — Telehealth: Payer: Self-pay | Admitting: *Deleted

## 2021-09-13 ENCOUNTER — Encounter: Payer: Self-pay | Admitting: Radiology

## 2021-09-13 DIAGNOSIS — Z87891 Personal history of nicotine dependence: Secondary | ICD-10-CM

## 2021-09-13 DIAGNOSIS — Z6832 Body mass index (BMI) 32.0-32.9, adult: Secondary | ICD-10-CM | POA: Diagnosis not present

## 2021-09-13 DIAGNOSIS — I2510 Atherosclerotic heart disease of native coronary artery without angina pectoris: Secondary | ICD-10-CM | POA: Diagnosis present

## 2021-09-13 DIAGNOSIS — Z9989 Dependence on other enabling machines and devices: Secondary | ICD-10-CM

## 2021-09-13 DIAGNOSIS — I5032 Chronic diastolic (congestive) heart failure: Secondary | ICD-10-CM | POA: Diagnosis present

## 2021-09-13 DIAGNOSIS — R778 Other specified abnormalities of plasma proteins: Secondary | ICD-10-CM | POA: Diagnosis not present

## 2021-09-13 DIAGNOSIS — I110 Hypertensive heart disease with heart failure: Secondary | ICD-10-CM | POA: Diagnosis present

## 2021-09-13 DIAGNOSIS — Z8673 Personal history of transient ischemic attack (TIA), and cerebral infarction without residual deficits: Secondary | ICD-10-CM

## 2021-09-13 DIAGNOSIS — J9601 Acute respiratory failure with hypoxia: Secondary | ICD-10-CM | POA: Diagnosis present

## 2021-09-13 DIAGNOSIS — G4733 Obstructive sleep apnea (adult) (pediatric): Secondary | ICD-10-CM | POA: Diagnosis present

## 2021-09-13 DIAGNOSIS — Z66 Do not resuscitate: Secondary | ICD-10-CM | POA: Diagnosis present

## 2021-09-13 DIAGNOSIS — E669 Obesity, unspecified: Secondary | ICD-10-CM | POA: Diagnosis present

## 2021-09-13 DIAGNOSIS — F419 Anxiety disorder, unspecified: Secondary | ICD-10-CM | POA: Diagnosis present

## 2021-09-13 DIAGNOSIS — I248 Other forms of acute ischemic heart disease: Secondary | ICD-10-CM | POA: Diagnosis present

## 2021-09-13 DIAGNOSIS — A419 Sepsis, unspecified organism: Secondary | ICD-10-CM | POA: Diagnosis present

## 2021-09-13 DIAGNOSIS — A4189 Other specified sepsis: Secondary | ICD-10-CM | POA: Diagnosis present

## 2021-09-13 DIAGNOSIS — J069 Acute upper respiratory infection, unspecified: Secondary | ICD-10-CM | POA: Diagnosis present

## 2021-09-13 DIAGNOSIS — Z8249 Family history of ischemic heart disease and other diseases of the circulatory system: Secondary | ICD-10-CM | POA: Diagnosis not present

## 2021-09-13 DIAGNOSIS — J440 Chronic obstructive pulmonary disease with acute lower respiratory infection: Secondary | ICD-10-CM | POA: Diagnosis present

## 2021-09-13 DIAGNOSIS — I10 Essential (primary) hypertension: Secondary | ICD-10-CM | POA: Diagnosis present

## 2021-09-13 DIAGNOSIS — Z9049 Acquired absence of other specified parts of digestive tract: Secondary | ICD-10-CM

## 2021-09-13 DIAGNOSIS — N179 Acute kidney failure, unspecified: Secondary | ICD-10-CM | POA: Diagnosis present

## 2021-09-13 DIAGNOSIS — F32A Depression, unspecified: Secondary | ICD-10-CM | POA: Diagnosis present

## 2021-09-13 DIAGNOSIS — U071 COVID-19: Secondary | ICD-10-CM | POA: Diagnosis present

## 2021-09-13 DIAGNOSIS — Z974 Presence of external hearing-aid: Secondary | ICD-10-CM

## 2021-09-13 DIAGNOSIS — J432 Centrilobular emphysema: Secondary | ICD-10-CM

## 2021-09-13 DIAGNOSIS — J1282 Pneumonia due to coronavirus disease 2019: Secondary | ICD-10-CM | POA: Diagnosis present

## 2021-09-13 DIAGNOSIS — Z8619 Personal history of other infectious and parasitic diseases: Secondary | ICD-10-CM

## 2021-09-13 DIAGNOSIS — E785 Hyperlipidemia, unspecified: Secondary | ICD-10-CM | POA: Diagnosis present

## 2021-09-13 DIAGNOSIS — I252 Old myocardial infarction: Secondary | ICD-10-CM

## 2021-09-13 DIAGNOSIS — Y92009 Unspecified place in unspecified non-institutional (private) residence as the place of occurrence of the external cause: Secondary | ICD-10-CM | POA: Diagnosis not present

## 2021-09-13 DIAGNOSIS — F4310 Post-traumatic stress disorder, unspecified: Secondary | ICD-10-CM | POA: Diagnosis present

## 2021-09-13 DIAGNOSIS — R509 Fever, unspecified: Secondary | ICD-10-CM | POA: Diagnosis present

## 2021-09-13 DIAGNOSIS — D696 Thrombocytopenia, unspecified: Secondary | ICD-10-CM | POA: Diagnosis present

## 2021-09-13 DIAGNOSIS — R7989 Other specified abnormal findings of blood chemistry: Secondary | ICD-10-CM | POA: Diagnosis present

## 2021-09-13 DIAGNOSIS — J441 Chronic obstructive pulmonary disease with (acute) exacerbation: Secondary | ICD-10-CM | POA: Diagnosis present

## 2021-09-13 DIAGNOSIS — I480 Paroxysmal atrial fibrillation: Secondary | ICD-10-CM | POA: Diagnosis present

## 2021-09-13 DIAGNOSIS — Z9181 History of falling: Secondary | ICD-10-CM | POA: Diagnosis not present

## 2021-09-13 DIAGNOSIS — E7800 Pure hypercholesterolemia, unspecified: Secondary | ICD-10-CM | POA: Diagnosis present

## 2021-09-13 DIAGNOSIS — W19XXXA Unspecified fall, initial encounter: Secondary | ICD-10-CM

## 2021-09-13 DIAGNOSIS — M1990 Unspecified osteoarthritis, unspecified site: Secondary | ICD-10-CM | POA: Diagnosis present

## 2021-09-13 DIAGNOSIS — I495 Sick sinus syndrome: Secondary | ICD-10-CM | POA: Diagnosis present

## 2021-09-13 DIAGNOSIS — K219 Gastro-esophageal reflux disease without esophagitis: Secondary | ICD-10-CM | POA: Diagnosis present

## 2021-09-13 DIAGNOSIS — C9110 Chronic lymphocytic leukemia of B-cell type not having achieved remission: Secondary | ICD-10-CM | POA: Diagnosis present

## 2021-09-13 DIAGNOSIS — Z951 Presence of aortocoronary bypass graft: Secondary | ICD-10-CM

## 2021-09-13 DIAGNOSIS — Z7951 Long term (current) use of inhaled steroids: Secondary | ICD-10-CM

## 2021-09-13 DIAGNOSIS — Z79899 Other long term (current) drug therapy: Secondary | ICD-10-CM

## 2021-09-13 DIAGNOSIS — J449 Chronic obstructive pulmonary disease, unspecified: Secondary | ICD-10-CM | POA: Diagnosis present

## 2021-09-13 DIAGNOSIS — R531 Weakness: Secondary | ICD-10-CM

## 2021-09-13 DIAGNOSIS — Z9221 Personal history of antineoplastic chemotherapy: Secondary | ICD-10-CM

## 2021-09-13 DIAGNOSIS — Z950 Presence of cardiac pacemaker: Secondary | ICD-10-CM

## 2021-09-13 DIAGNOSIS — Z7901 Long term (current) use of anticoagulants: Secondary | ICD-10-CM

## 2021-09-13 LAB — LACTIC ACID, PLASMA
Lactic Acid, Venous: 1.2 mmol/L (ref 0.5–1.9)
Lactic Acid, Venous: 1 mmol/L (ref 0.5–1.9)

## 2021-09-13 LAB — HEPATIC FUNCTION PANEL
ALT: 27 U/L (ref 0–44)
AST: 29 U/L (ref 15–41)
Albumin: 3.8 g/dL (ref 3.5–5.0)
Alkaline Phosphatase: 73 U/L (ref 38–126)
Bilirubin, Direct: 0.3 mg/dL — ABNORMAL HIGH (ref 0.0–0.2)
Indirect Bilirubin: 1.5 mg/dL — ABNORMAL HIGH (ref 0.3–0.9)
Total Bilirubin: 1.8 mg/dL — ABNORMAL HIGH (ref 0.3–1.2)
Total Protein: 6.9 g/dL (ref 6.5–8.1)

## 2021-09-13 LAB — URINALYSIS, ROUTINE W REFLEX MICROSCOPIC
Bilirubin Urine: NEGATIVE
Glucose, UA: NEGATIVE mg/dL
Hgb urine dipstick: NEGATIVE
Ketones, ur: NEGATIVE mg/dL
Leukocytes,Ua: NEGATIVE
Nitrite: NEGATIVE
Protein, ur: NEGATIVE mg/dL
Specific Gravity, Urine: 1.009 (ref 1.005–1.030)
pH: 7 (ref 5.0–8.0)

## 2021-09-13 LAB — C-REACTIVE PROTEIN: CRP: 7.5 mg/dL — ABNORMAL HIGH (ref <1.0)

## 2021-09-13 LAB — BASIC METABOLIC PANEL
Anion gap: 12 (ref 5–15)
BUN: 24 mg/dL — ABNORMAL HIGH (ref 8–23)
CO2: 27 mmol/L (ref 22–32)
Calcium: 8.3 mg/dL — ABNORMAL LOW (ref 8.9–10.3)
Chloride: 97 mmol/L — ABNORMAL LOW (ref 98–111)
Creatinine, Ser: 1.25 mg/dL — ABNORMAL HIGH (ref 0.61–1.24)
GFR, Estimated: 60 mL/min — ABNORMAL LOW (ref >60)
Glucose, Bld: 130 mg/dL — ABNORMAL HIGH (ref 70–99)
Potassium: 3.7 mmol/L (ref 3.5–5.1)
Sodium: 136 mmol/L (ref 135–145)

## 2021-09-13 LAB — CBC
HCT: 40.4 % (ref 39.0–52.0)
Hemoglobin: 13.2 g/dL (ref 13.0–17.0)
MCH: 33.9 pg (ref 26.0–34.0)
MCHC: 32.7 g/dL (ref 30.0–36.0)
MCV: 103.9 fL — ABNORMAL HIGH (ref 80.0–100.0)
Platelets: 113 10*3/uL — ABNORMAL LOW (ref 150–400)
RBC: 3.89 MIL/uL — ABNORMAL LOW (ref 4.22–5.81)
RDW: 14.6 % (ref 11.5–15.5)
WBC: 10.9 10*3/uL — ABNORMAL HIGH (ref 4.0–10.5)
nRBC: 0 % (ref 0.0–0.2)

## 2021-09-13 LAB — BRAIN NATRIURETIC PEPTIDE: B Natriuretic Peptide: 47 pg/mL (ref 0.0–100.0)

## 2021-09-13 LAB — RESP PANEL BY RT-PCR (FLU A&B, COVID) ARPGX2
Influenza A by PCR: NEGATIVE
Influenza B by PCR: NEGATIVE
SARS Coronavirus 2 by RT PCR: POSITIVE — AB

## 2021-09-13 LAB — FERRITIN: Ferritin: 196 ng/mL (ref 24–336)

## 2021-09-13 LAB — PROCALCITONIN: Procalcitonin: <0.1 ng/mL

## 2021-09-13 LAB — TROPONIN I (HIGH SENSITIVITY)
Troponin I (High Sensitivity): 22 ng/L — ABNORMAL HIGH (ref <18)
Troponin I (High Sensitivity): 21 ng/L — ABNORMAL HIGH (ref <18)

## 2021-09-13 IMAGING — CR DG CHEST 2V
1 series · 2 of 2 positions shown · non-contrast
Comparison: [DATE]

CLINICAL DATA: 75-year-old male with weakness, falls.

EXAM:
CHEST - 2 VIEW

[Series 1: dg chest 2 view · 0.14mm/px · 2 of 2 slices shown]
[im 1/2]
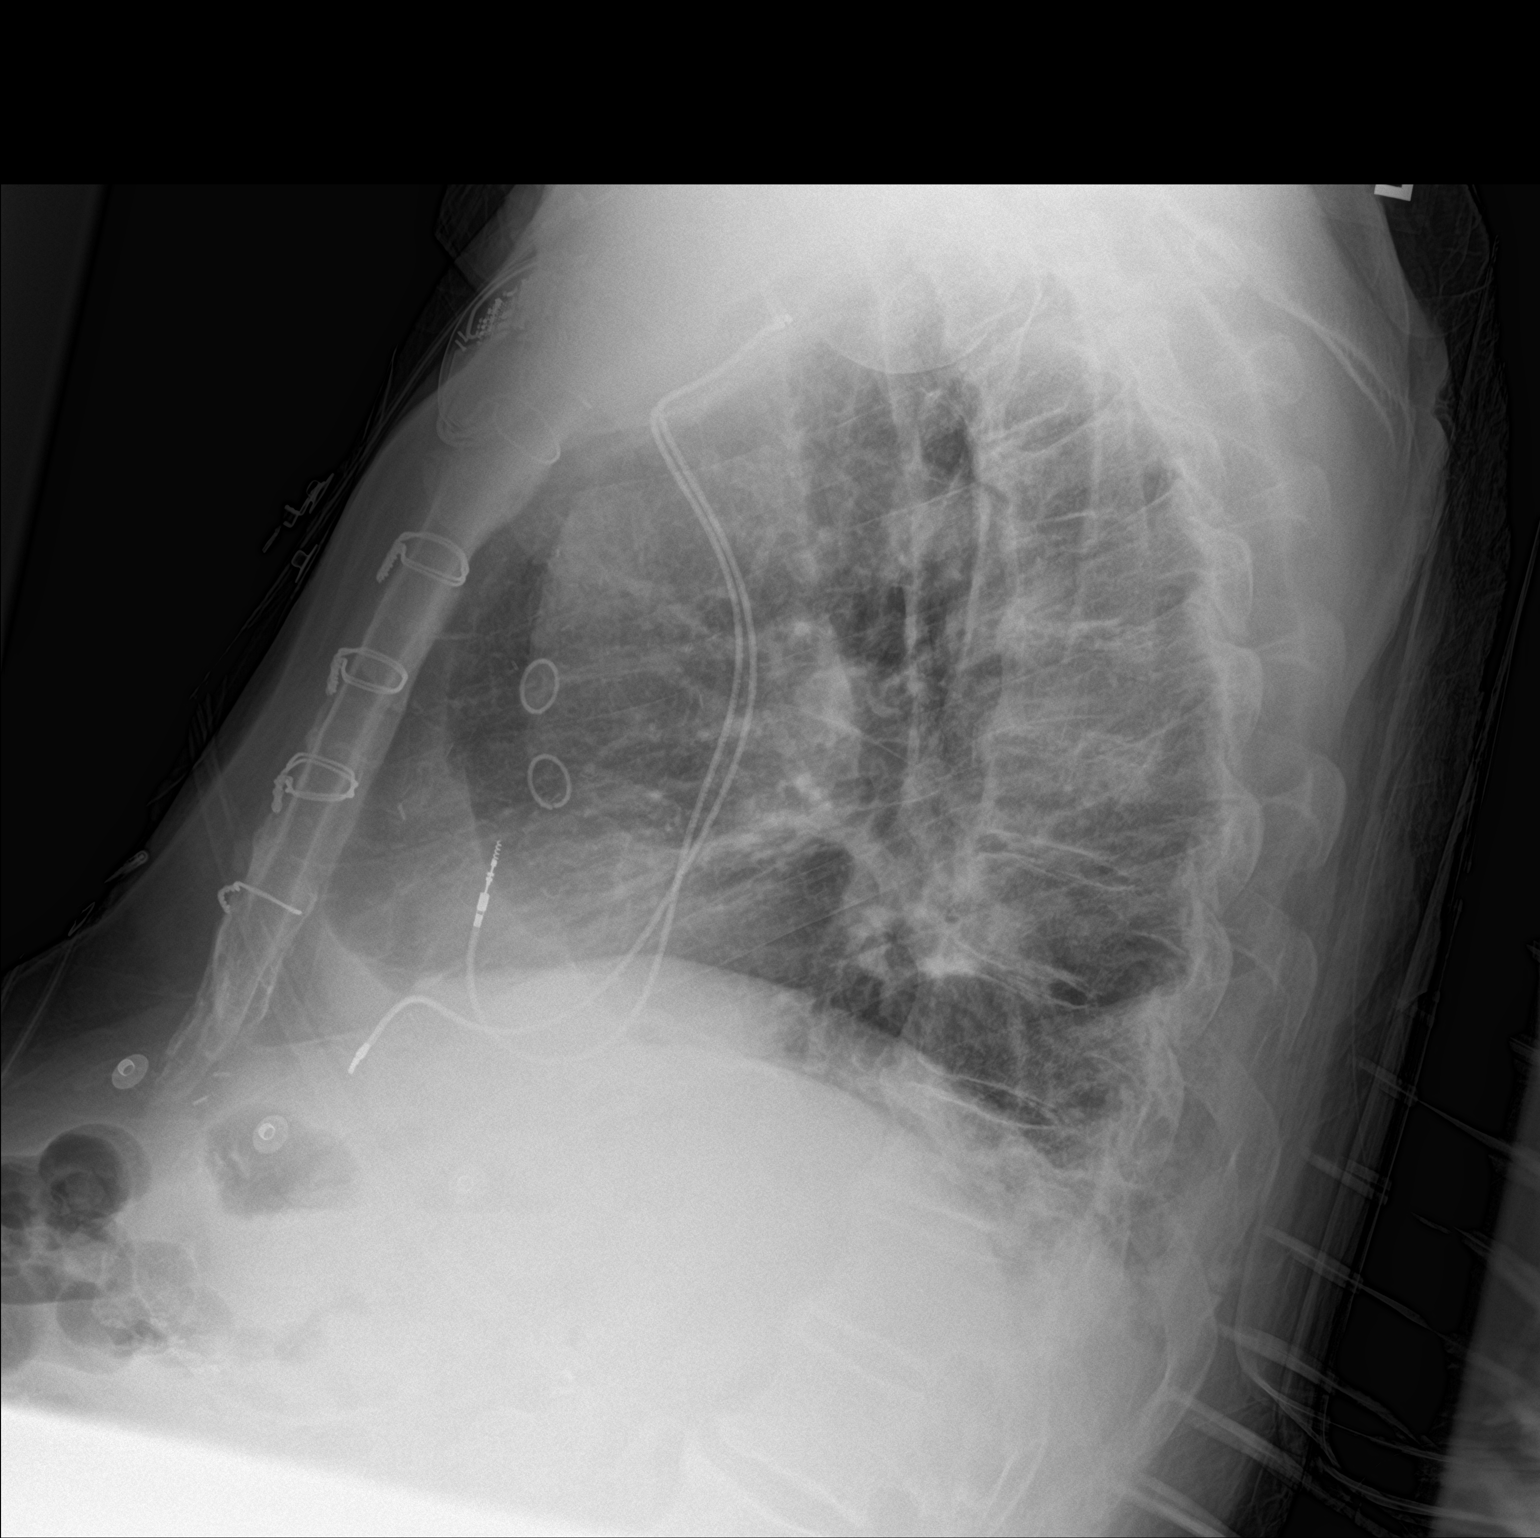
[im 2/2]
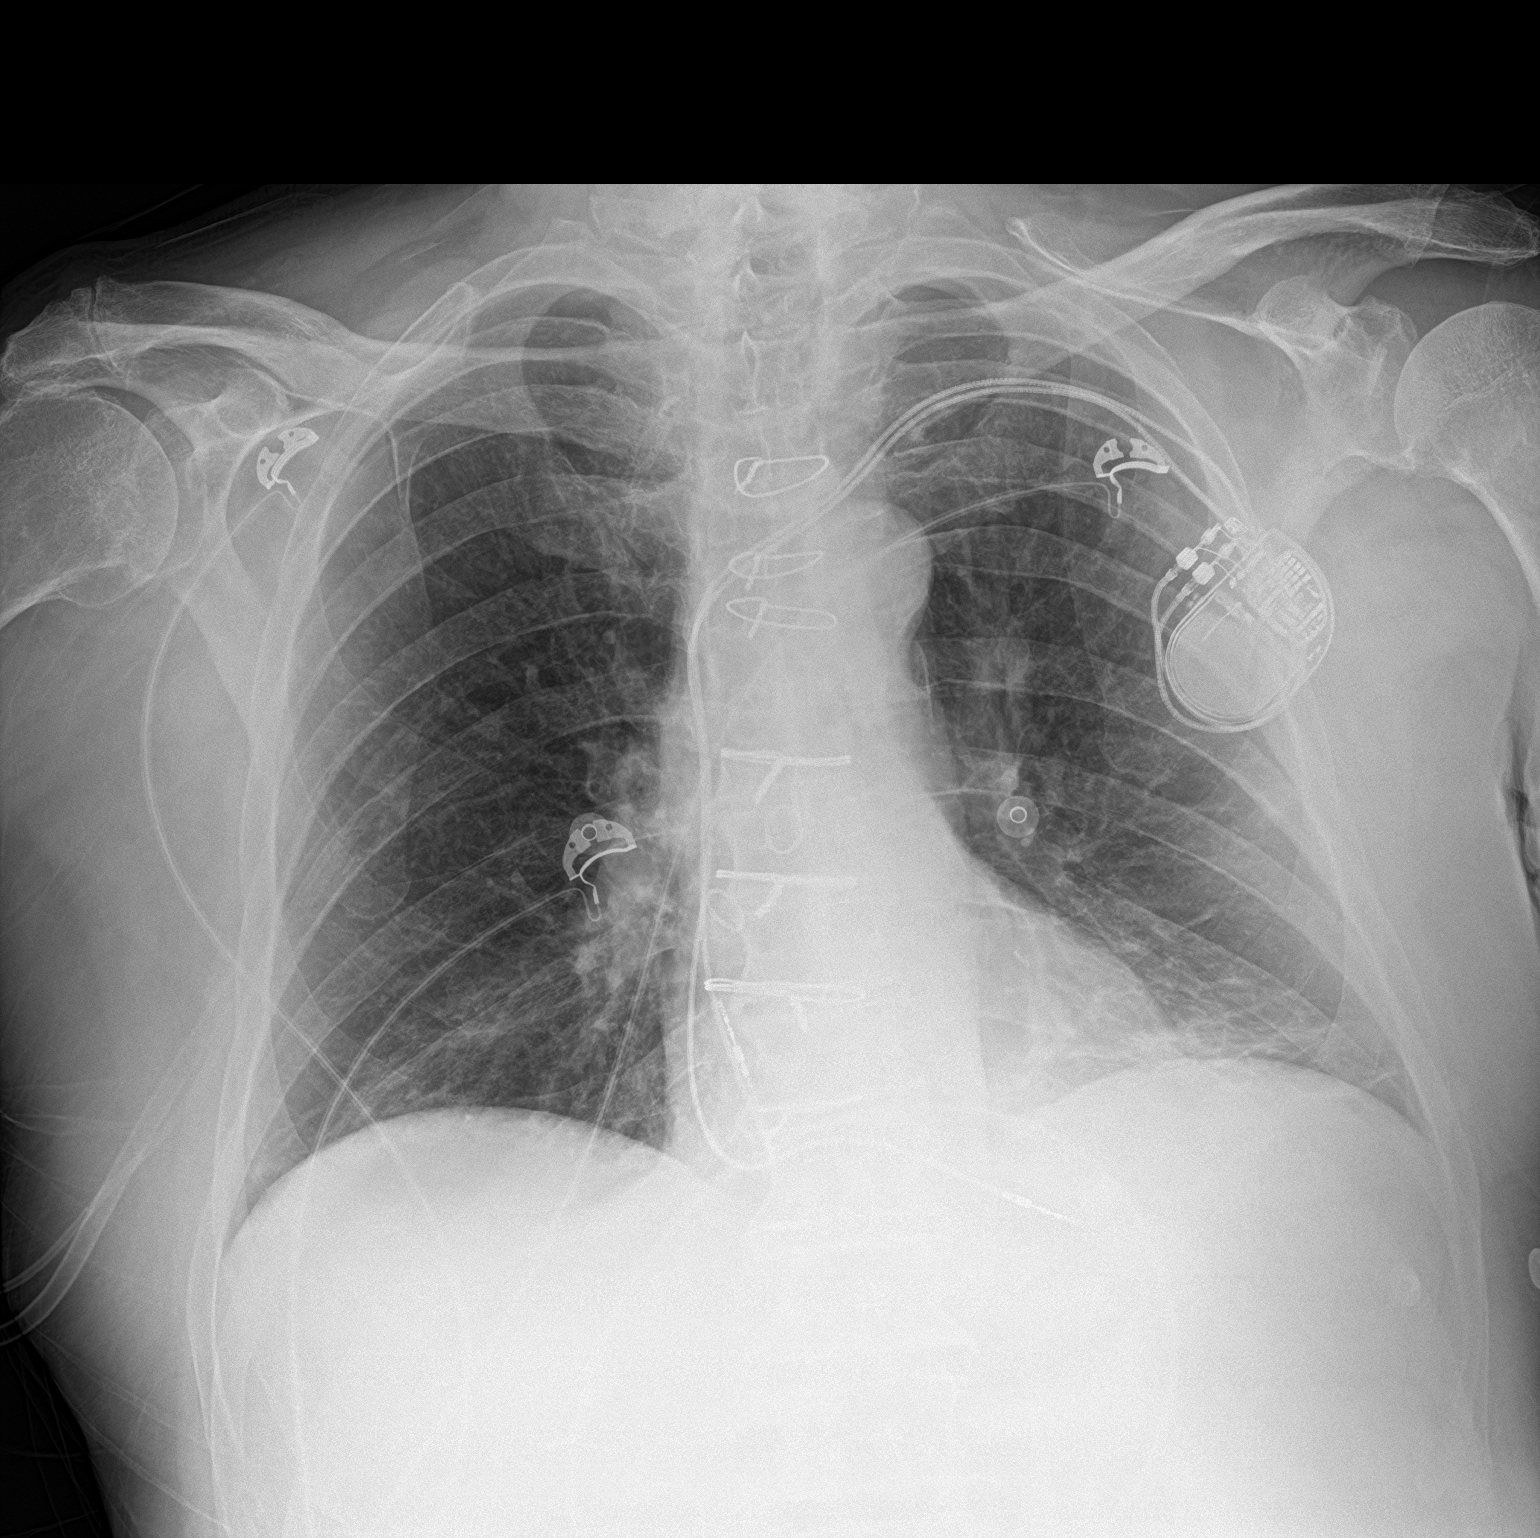

[2 of 2 positions shown; findings below may reference images not displayed]

FINDINGS: The mediastinal contours are within normal limits. No cardiomegaly.
Atherosclerotic calcification of the aortic arch. Unchanged position
of left subclavian vein approach dual lead pacemaker with leads in
expected locations. Mild bibasilar streaky opacities. The lungs are
otherwise clear bilaterally without evidence of focal consolidation,
pleural effusion, or pneumothorax. No acute osseous abnormality.
IMPRESSION: 1. Mild bibasilar subsegmental atelectasis.
2.  Aortic Atherosclerosis ([5K]-[5K]).

## 2021-09-13 IMAGING — CT CT HEAD W/O CM
4 series · 16 of 47 positions shown, 18 images · non-contrast
Comparison: [DATE]

CLINICAL DATA: Multiple falls, chest pain, history atrial
fibrillation, COPD, CLL, coronary artery disease post MI,
hypertension



[Series 2: head bone · axial · 0.41mm/px · z∈[+498,+530]mm · 3 of 82 slices shown]
[im 9/82  bone]
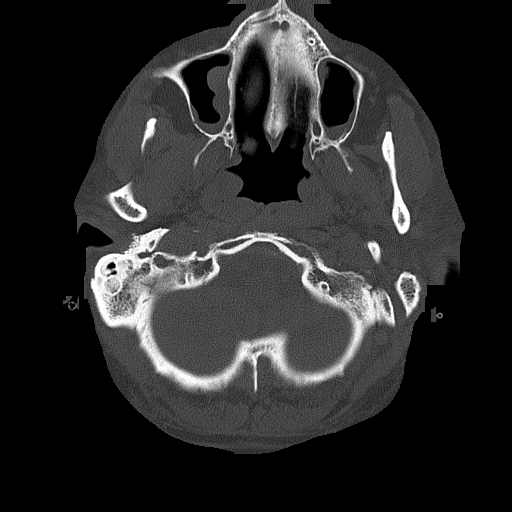
[im 17/82  bone]
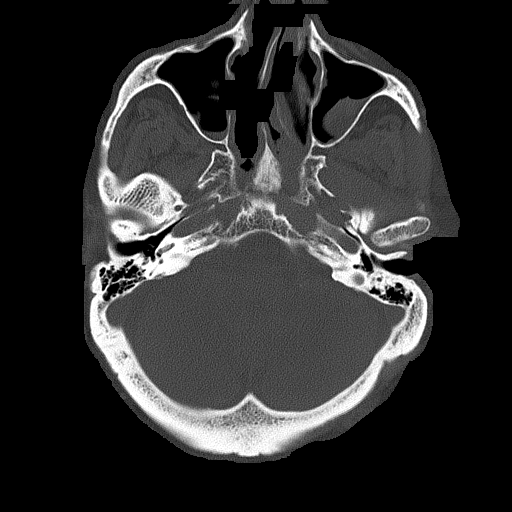
[im 25/82  bone]
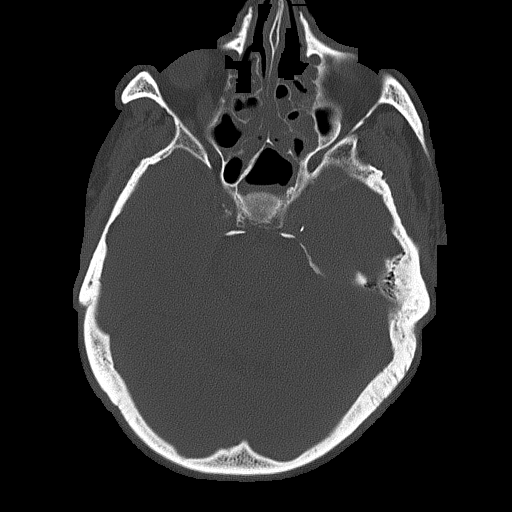

[Series 3: coronal soft tissue · coronal · 0.34mm/px · 3 of 75 slices shown]
[im 28/75  brain]
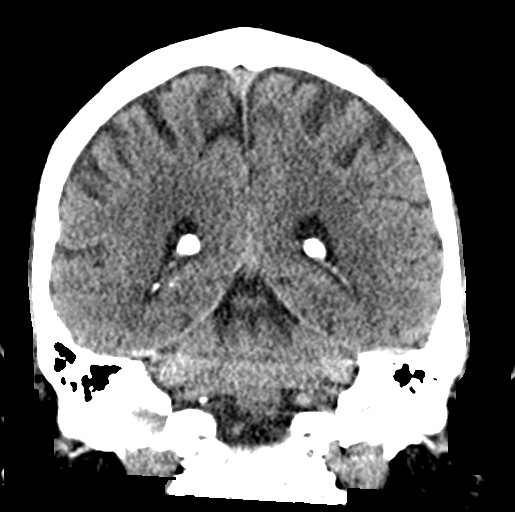
[im 34/75  brain]
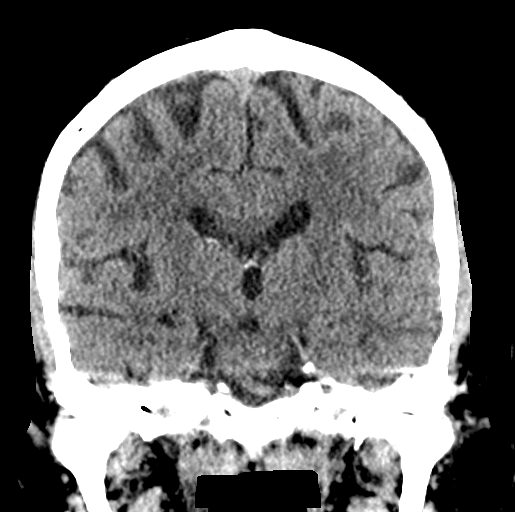
[im 41/75  brain]
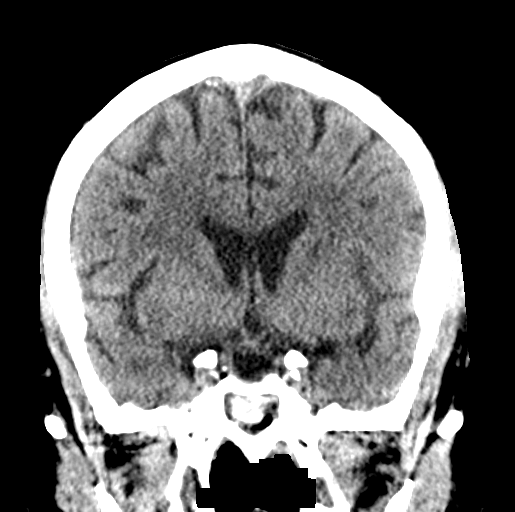

[Series 4: sagittal soft tissue · sagittal · 0.34mm/px · 3 of 62 slices shown]
[im 21/62  brain]
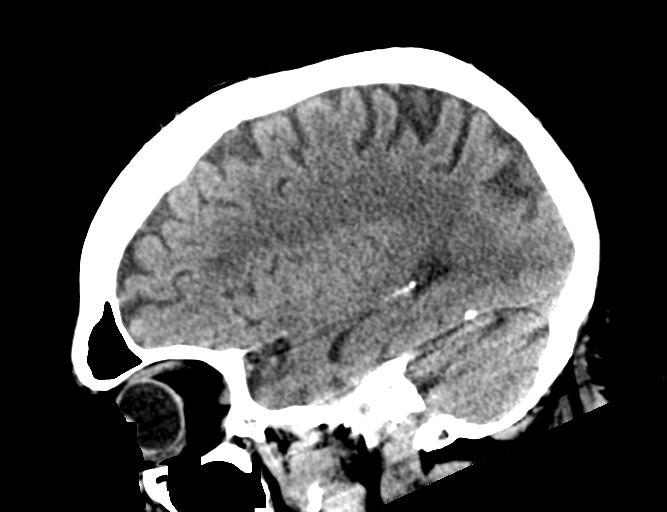
[im 31/62  brain]
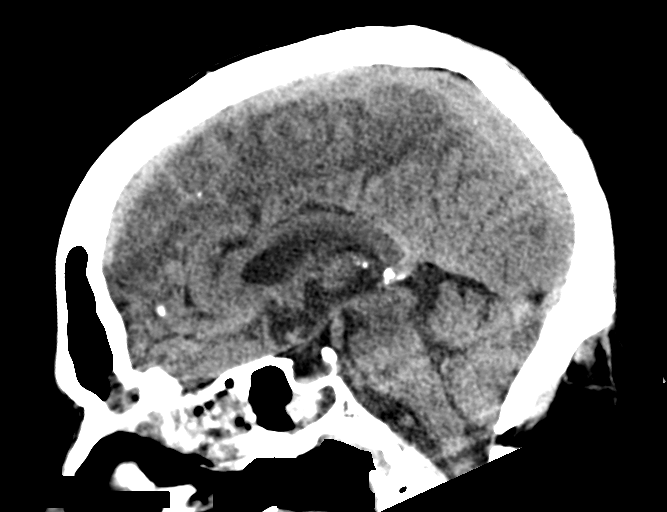
[im 41/62  brain]
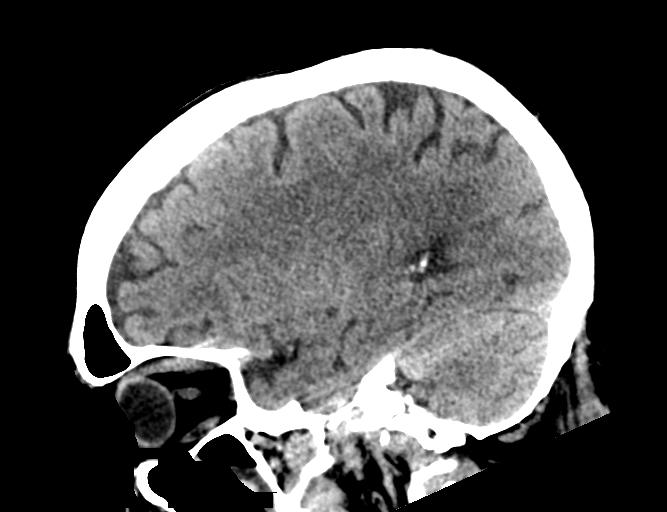

[Series 5: head wo · axial · 0.41mm/px · z∈[+502,+622]mm · 7 of 33 slices shown, 9 images]
[im 5/33  brain]
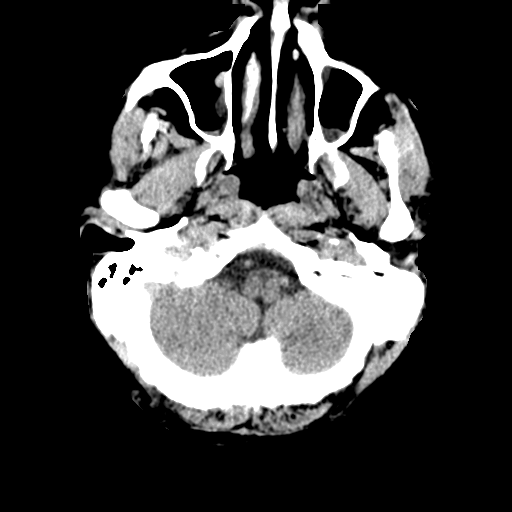
[im 5/33  bone]
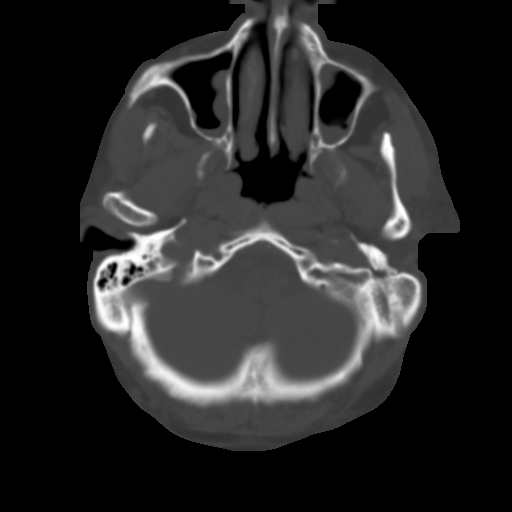
[im 9/33  brain]
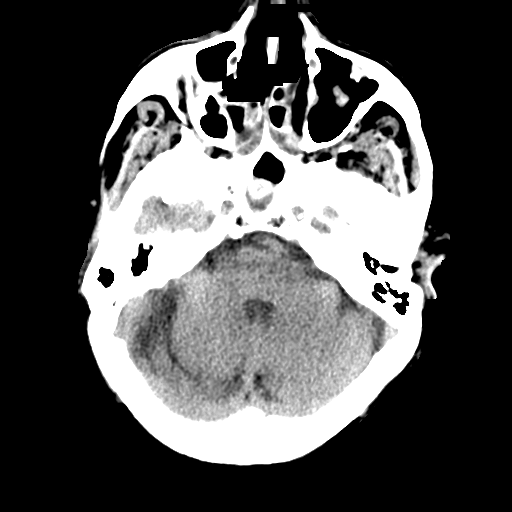
[im 13/33  brain]
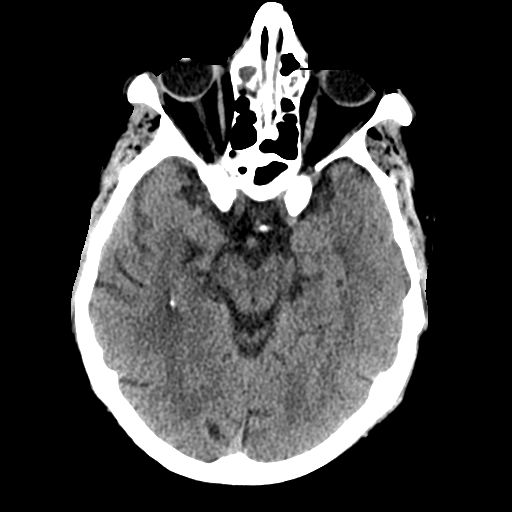
[im 17/33  brain]
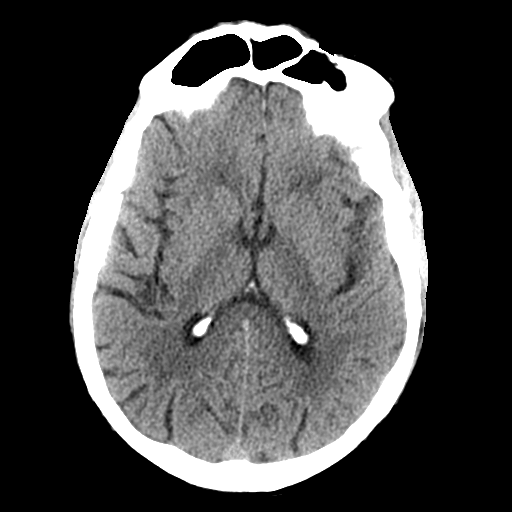
[im 21/33  brain]
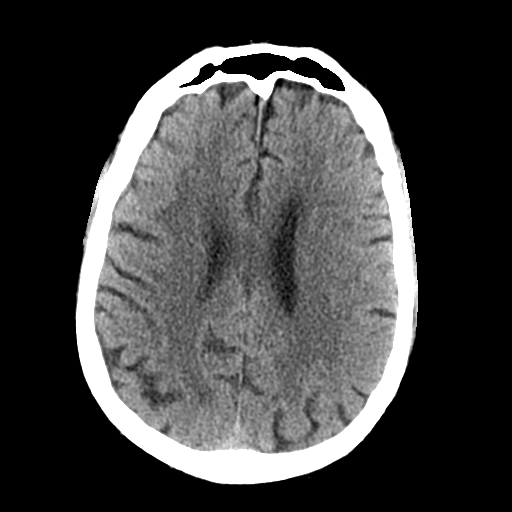
[im 21/33  bone]
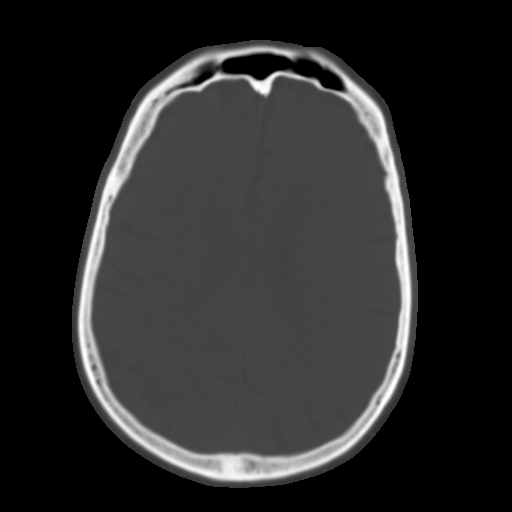
[im 25/33  brain]
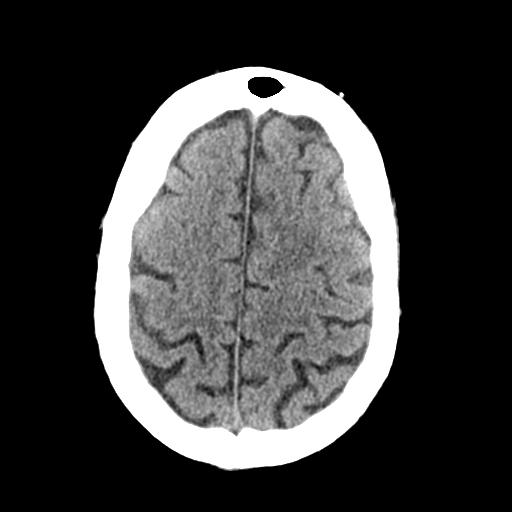
[im 29/33  brain]
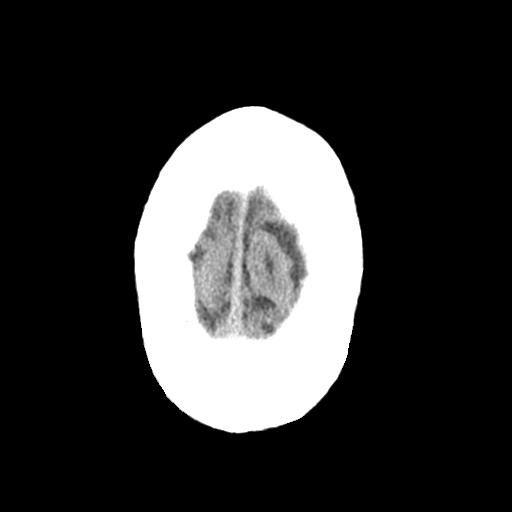

[16 of 47 positions shown; findings below may reference images not displayed]

FINDINGS: Brain: Generalized atrophy. Normal ventricular morphology. No
midline shift or mass effect. Small vessel chronic ischemic changes
of deep cerebral white matter. No intracranial hemorrhage, mass
lesion, evidence of acute infarction, or extra-axial fluid
collection.

Vascular: No hyperdense vessels. Atherosclerotic calcifications of
internal carotid arteries bilaterally at skull base.

Skull: Intact

Sinuses/Orbits: Diffuse mucosal thickening throughout paranasal
sinuses with suspected mucosal retention cysts within sphenoid and
maxillary sinuses

Other: N/A
IMPRESSION: Generalized atrophy with mild small vessel chronic ischemic changes
of deep cerebral white matter.

No acute intracranial abnormalities.

Chronic sinus disease changes.

## 2021-09-13 IMAGING — CT CT ANGIO CHEST
4 of 8 series · 17 of 46 positions shown · IV contrast (APPLIED)
Comparison: CT chest [DATE]

CLINICAL DATA: Chest pain, history of multiple falls, question
pulmonary embolism

EXAM:
CT ANGIOGRAPHY CHEST WITH CONTRAST
TECHNIQUE: Multidetector CT imaging of the chest was performed using the
standard protocol during bolus administration of intravenous
contrast. Multiplanar CT image reconstructions and MIPs were
obtained to evaluate the vascular anatomy.

[Series 8: axial st · axial · 0.78mm/px · z∈[+134,+329]mm · 4 of 109 slices shown (1 of 2)]
[im 22/109  lung]
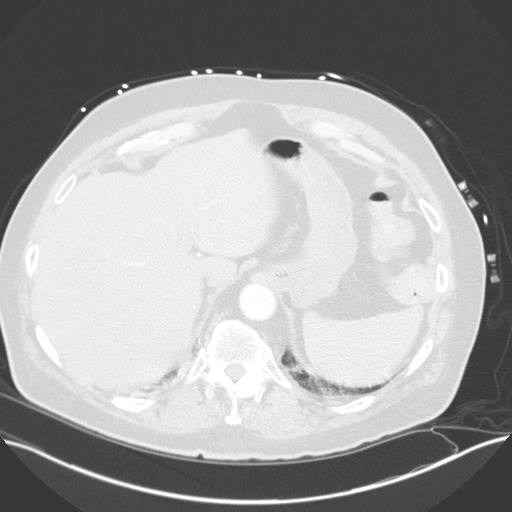
[im 44/109  soft-tissue]
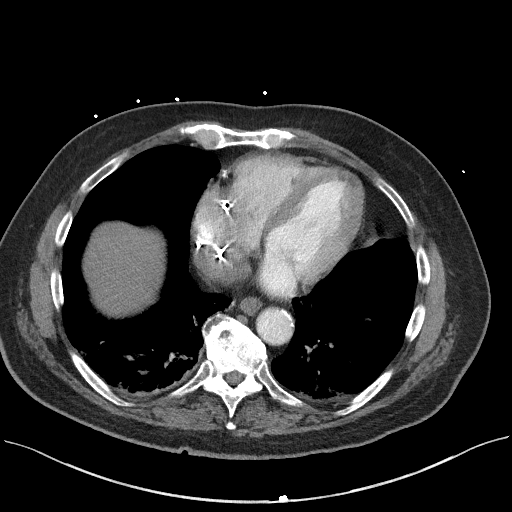
[im 65/109  lung]
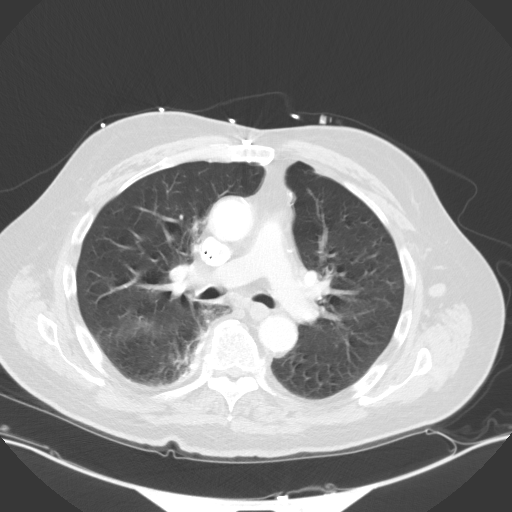
[im 87/109  soft-tissue]
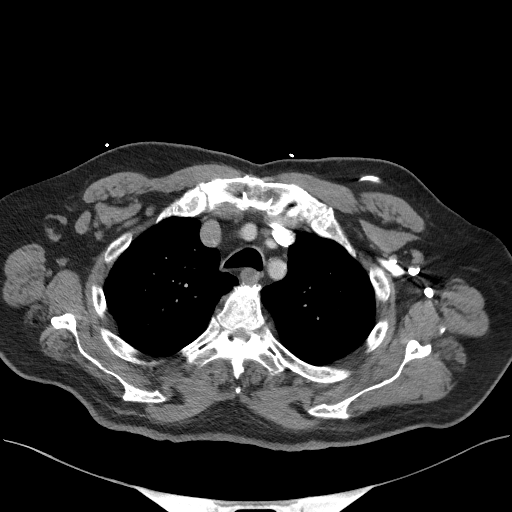

[Series 9: axial st · axial · 0.76mm/px · z∈[+167,+314]mm · 3 of 99 slices shown (2 of 2)]
[im 25/99  lung]
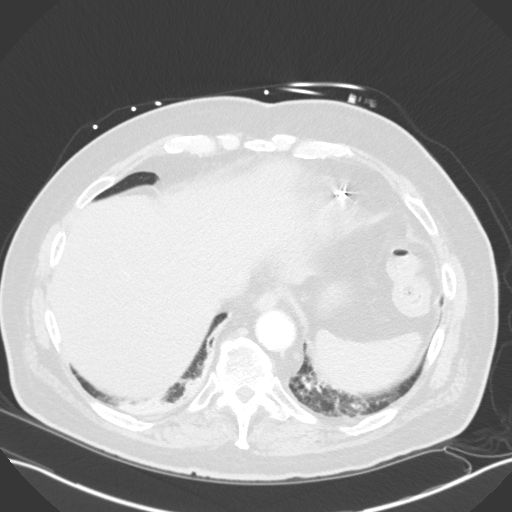
[im 50/99  lung]
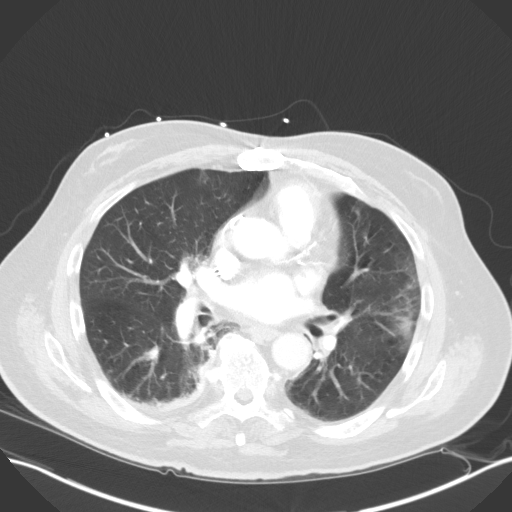
[im 74/99  lung]
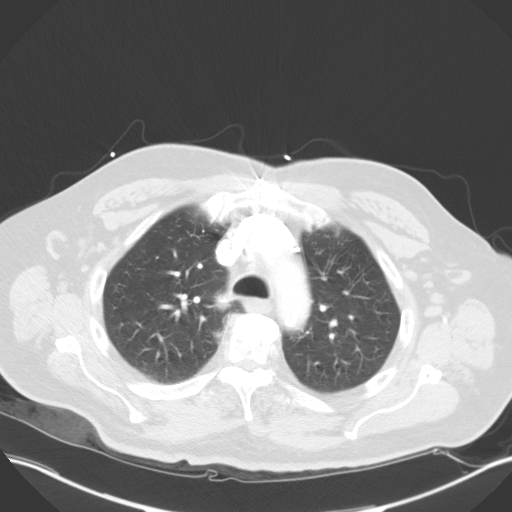

[Series 10: thins · axial · 0.76mm/px · z∈[+114,+369]mm · 8 of 295 slices shown]
[im 20/295  lung]
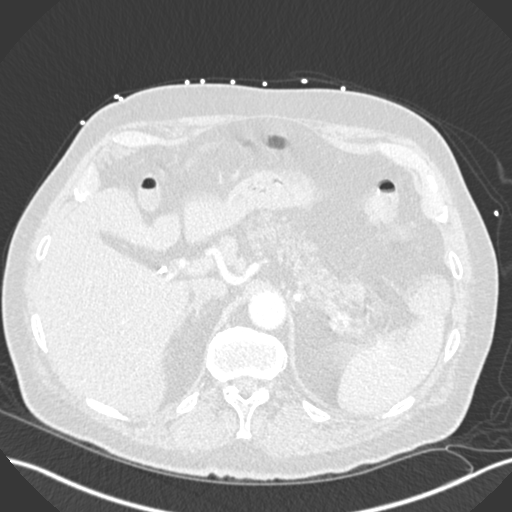
[im 59/295  lung]
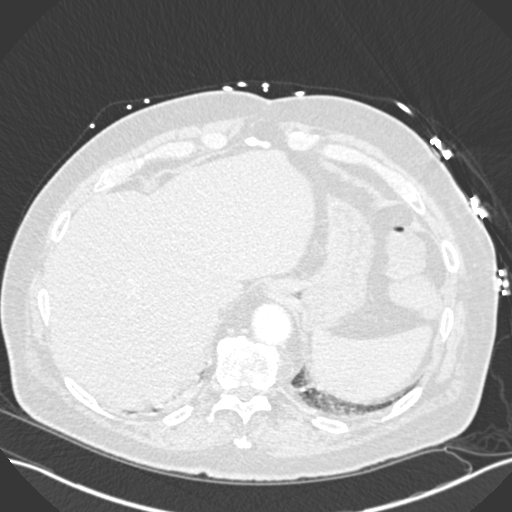
[im 99/295  lung]
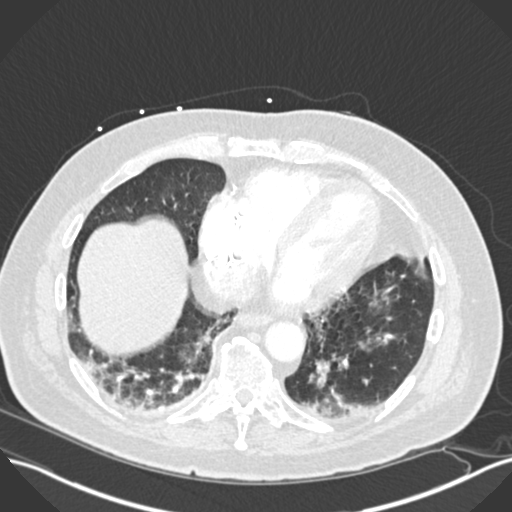
[im 138/295  lung]
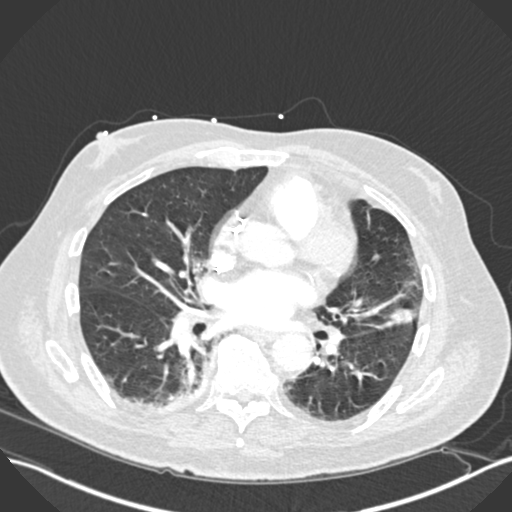
[im 157/295  lung]
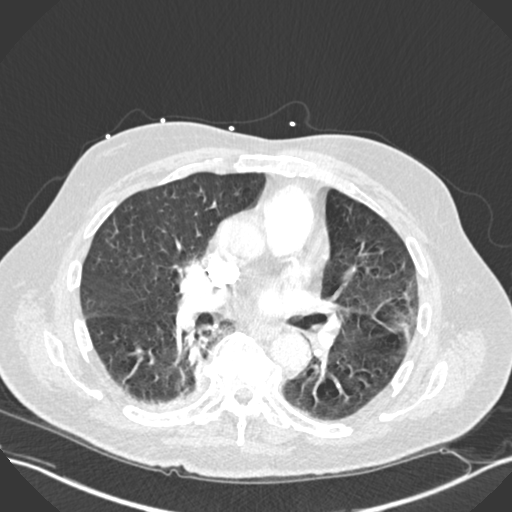
[im 197/295  lung]
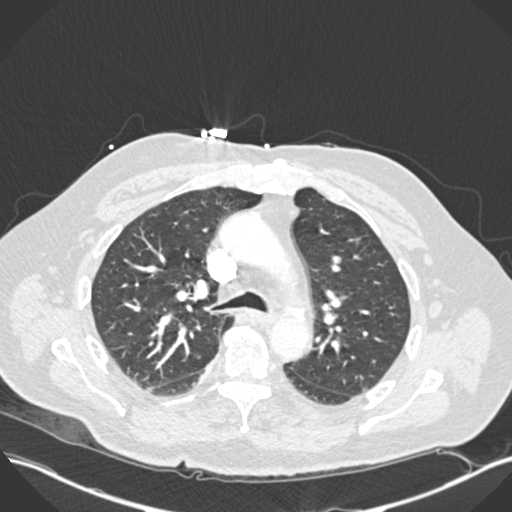
[im 236/295  lung]
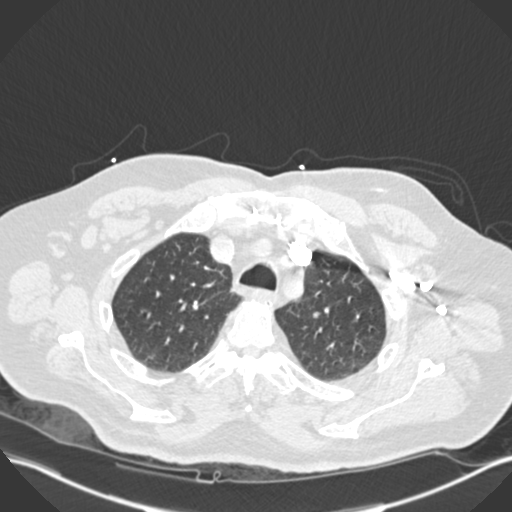
[im 275/295  lung]
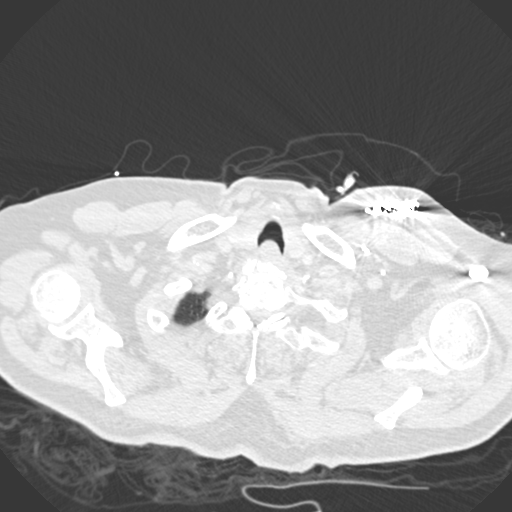

[Series 12: coronal mpr · coronal · 0.60mm/px · 2 of 104 slices shown]
[im 35/104  soft-tissue]
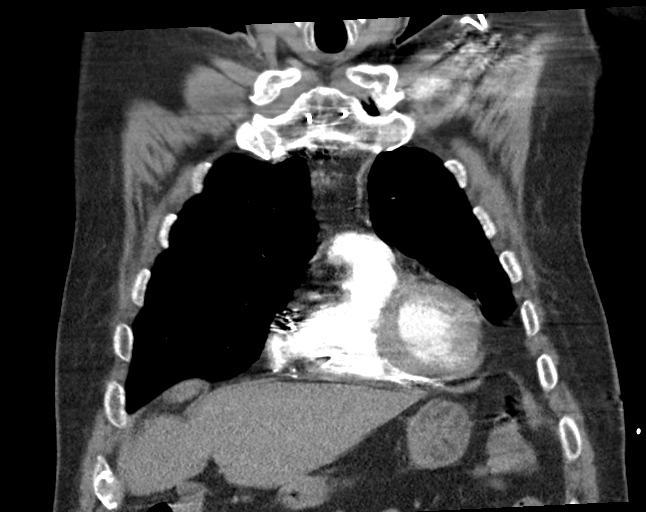
[im 69/104  soft-tissue]
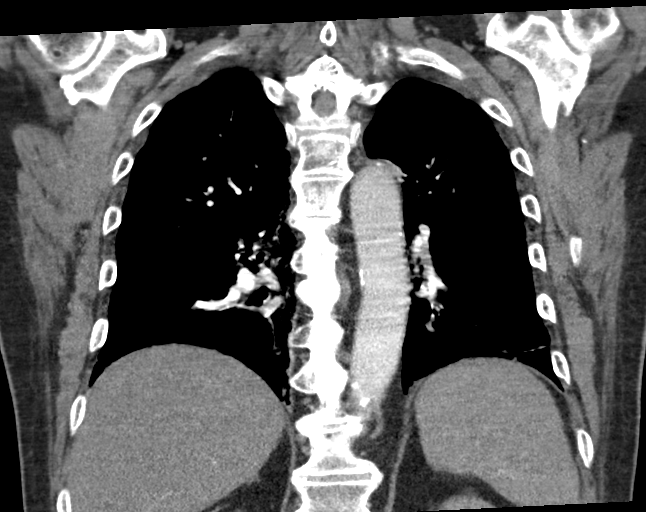

[17 of 46 positions shown; findings below may reference images not displayed]

RADIATION DOSE REDUCTION: This exam was performed according to the
departmental dose-optimization program which includes automated
exposure control, adjustment of the mA and/or kV according to
patient size and/or use of iterative reconstruction technique.

CONTRAST:  75mL OMNIPAQUE IOHEXOL 350 MG/ML SOLN
FINDINGS: Cardiovascular: Atherosclerotic calcifications aorta, proximal great
vessels, coronary arteries. Aorta normal caliber. No aneurysm or
dissection. Mild enlargement of cardiac chambers. No pericardial
effusion. Pacemaker leads RIGHT atrium and RIGHT ventricle.
Respiratory motion artifacts in the mid to lower lungs. Pulmonary
arteries patent. No evidence of pulmonary embolism.

Mediastinum/Nodes: Esophagus unremarkable. Base of cervical region
normal appearance. Multiple normal and upper normal sized axillary
lymph nodes bilaterally.

Lungs/Pleura: Dependent atelectasis in the BILATERAL lower lobes.
Subsegmental atelectasis in lingula. No acute infiltrate, pleural
effusion, or pneumothorax.

Upper Abdomen: Gallbladder surgically absent. Visualized upper
abdomen unremarkable

Musculoskeletal: No acute osseous findings.

Review of the MIP images confirms the above findings.
IMPRESSION: No evidence of pulmonary embolism.

Scattered atherosclerotic calcifications including coronary
arteries.

Bibasilar atelectasis.

Aortic Atherosclerosis ([UH]-[UH]).

## 2021-09-13 MED ORDER — APIXABAN 5 MG PO TABS
5.0000 mg | ORAL_TABLET | Freq: Two times a day (BID) | ORAL | Status: DC
  Administered 2021-09-13 – 2021-09-19 (×12): 5 mg via ORAL
  Filled 2021-09-13 (×12): qty 1

## 2021-09-13 MED ORDER — MIRTAZAPINE 15 MG PO TABS
15.0000 mg | ORAL_TABLET | Freq: Every evening | ORAL | Status: DC | PRN
  Administered 2021-09-14: 15 mg via ORAL
  Filled 2021-09-13: qty 1

## 2021-09-13 MED ORDER — ATORVASTATIN CALCIUM 20 MG PO TABS
80.0000 mg | ORAL_TABLET | Freq: Every day | ORAL | Status: DC
Start: 2021-09-13 — End: 2021-09-19
  Administered 2021-09-13 – 2021-09-18 (×6): 80 mg via ORAL
  Filled 2021-09-13 (×6): qty 4

## 2021-09-13 MED ORDER — NITROGLYCERIN 0.4 MG SL SUBL
0.4000 mg | SUBLINGUAL_TABLET | SUBLINGUAL | Status: DC | PRN
  Administered 2021-09-15 – 2021-09-16 (×5): 0.4 mg via SUBLINGUAL
  Filled 2021-09-13 (×3): qty 1

## 2021-09-13 MED ORDER — ONDANSETRON HCL 4 MG/2ML IJ SOLN
4.0000 mg | Freq: Three times a day (TID) | INTRAMUSCULAR | Status: DC | PRN
Start: 2021-09-13 — End: 2021-09-19

## 2021-09-13 MED ORDER — ALBUTEROL SULFATE HFA 108 (90 BASE) MCG/ACT IN AERS
2.0000 | INHALATION_SPRAY | RESPIRATORY_TRACT | Status: DC | PRN
Start: 2021-09-13 — End: 2021-09-19
  Administered 2021-09-18: 2 via RESPIRATORY_TRACT
  Filled 2021-09-13 (×2): qty 6.7

## 2021-09-13 MED ORDER — ADULT MULTIVITAMIN W/MINERALS CH
1.0000 | ORAL_TABLET | Freq: Every day | ORAL | Status: DC
  Administered 2021-09-14 – 2021-09-19 (×6): 1 via ORAL
  Filled 2021-09-13 (×6): qty 1

## 2021-09-13 MED ORDER — METHYLPREDNISOLONE SODIUM SUCC 125 MG IJ SOLR
80.0000 mg | INTRAMUSCULAR | Status: DC
Start: 2021-09-13 — End: 2021-09-17
  Administered 2021-09-13 – 2021-09-16 (×4): 80 mg via INTRAVENOUS
  Filled 2021-09-13 (×4): qty 2

## 2021-09-13 MED ORDER — IOHEXOL 350 MG/ML SOLN
75.0000 mL | Freq: Once | INTRAVENOUS | Status: AC | PRN
  Administered 2021-09-13: 75 mL via INTRAVENOUS

## 2021-09-13 MED ORDER — SODIUM CHLORIDE 0.9 % IV BOLUS
500.0000 mL | Freq: Once | INTRAVENOUS | Status: AC
  Administered 2021-09-13: 500 mL via INTRAVENOUS

## 2021-09-13 MED ORDER — ACETAMINOPHEN 325 MG PO TABS
650.0000 mg | ORAL_TABLET | Freq: Four times a day (QID) | ORAL | Status: DC | PRN
Start: 2021-09-13 — End: 2021-09-19

## 2021-09-13 MED ORDER — EZETIMIBE 10 MG PO TABS
10.0000 mg | ORAL_TABLET | Freq: Every day | ORAL | Status: DC
  Administered 2021-09-14 – 2021-09-19 (×5): 10 mg via ORAL
  Filled 2021-09-13 (×8): qty 1

## 2021-09-13 MED ORDER — AZELASTINE HCL 0.1 % NA SOLN
2.0000 | Freq: Two times a day (BID) | NASAL | Status: DC
  Administered 2021-09-14 – 2021-09-19 (×11): 2 via NASAL
  Filled 2021-09-13: qty 30

## 2021-09-13 MED ORDER — METHYLPREDNISOLONE SODIUM SUCC 40 MG IJ SOLR: 40.0000 mg | Freq: Two times a day (BID) | INTRAMUSCULAR | Status: DC

## 2021-09-13 MED ORDER — TRAMADOL HCL 50 MG PO TABS
50.0000 mg | ORAL_TABLET | Freq: Two times a day (BID) | ORAL | Status: DC | PRN
  Administered 2021-09-18 – 2021-09-19 (×2): 50 mg via ORAL
  Filled 2021-09-13 (×2): qty 1

## 2021-09-13 MED ORDER — ALBUTEROL SULFATE (2.5 MG/3ML) 0.083% IN NEBU
2.5000 mg | INHALATION_SOLUTION | Freq: Once | RESPIRATORY_TRACT | Status: AC
  Administered 2021-09-13: 2.5 mg via RESPIRATORY_TRACT
  Filled 2021-09-13: qty 3

## 2021-09-13 MED ORDER — SODIUM CHLORIDE 0.9 % IV SOLN
200.0000 mg | Freq: Once | INTRAVENOUS | Status: AC
  Administered 2021-09-13: 200 mg via INTRAVENOUS
  Filled 2021-09-13: qty 200

## 2021-09-13 MED ORDER — AMIODARONE HCL 200 MG PO TABS
100.0000 mg | ORAL_TABLET | ORAL | Status: DC
  Administered 2021-09-13 – 2021-09-19 (×7): 100 mg via ORAL
  Filled 2021-09-13 (×6): qty 1

## 2021-09-13 MED ORDER — LORATADINE 10 MG PO TABS
10.0000 mg | ORAL_TABLET | Freq: Every day | ORAL | Status: DC | PRN
Start: 2021-09-13 — End: 2021-09-19

## 2021-09-13 MED ORDER — DM-GUAIFENESIN ER 30-600 MG PO TB12
1.0000 | ORAL_TABLET | Freq: Two times a day (BID) | ORAL | Status: DC | PRN
  Administered 2021-09-14 – 2021-09-15 (×2): 1 via ORAL
  Filled 2021-09-13 (×4): qty 1

## 2021-09-13 MED ORDER — ISOSORBIDE MONONITRATE ER 30 MG PO TB24
30.0000 mg | ORAL_TABLET | Freq: Every day | ORAL | Status: DC
  Administered 2021-09-14 – 2021-09-19 (×6): 30 mg via ORAL
  Filled 2021-09-13 (×6): qty 1

## 2021-09-13 MED ORDER — VENETOCLAX 100 MG PO TABS: 100.0000 mg | ORAL_TABLET | Freq: Every day | ORAL | Status: DC

## 2021-09-13 MED ORDER — REMDESIVIR 100 MG IV SOLR
100.0000 mg | Freq: Every day | INTRAVENOUS | Status: DC
  Filled 2021-09-13: qty 20

## 2021-09-13 MED ORDER — COQ10 200 MG PO CAPS: 200.0000 mg | ORAL_CAPSULE | Freq: Every day | ORAL | Status: DC

## 2021-09-13 MED ORDER — ACYCLOVIR 200 MG PO CAPS
400.0000 mg | ORAL_CAPSULE | Freq: Two times a day (BID) | ORAL | Status: DC
  Administered 2021-09-13 – 2021-09-19 (×12): 400 mg via ORAL
  Filled 2021-09-13 (×13): qty 2

## 2021-09-13 MED ORDER — AZITHROMYCIN 500 MG PO TABS
500.0000 mg | ORAL_TABLET | Freq: Every day | ORAL | Status: AC
  Administered 2021-09-13: 500 mg via ORAL
  Filled 2021-09-13: qty 1

## 2021-09-13 MED ORDER — PANTOPRAZOLE SODIUM 40 MG PO TBEC
40.0000 mg | DELAYED_RELEASE_TABLET | Freq: Every day | ORAL | Status: DC
Start: 2021-09-14 — End: 2021-09-19
  Administered 2021-09-14 – 2021-09-19 (×6): 40 mg via ORAL
  Filled 2021-09-13 (×6): qty 1

## 2021-09-13 MED ORDER — HYDRALAZINE HCL 20 MG/ML IJ SOLN: 5.0000 mg | INTRAMUSCULAR | Status: DC | PRN

## 2021-09-13 MED ORDER — IPRATROPIUM BROMIDE HFA 17 MCG/ACT IN AERS
2.0000 | INHALATION_SPRAY | RESPIRATORY_TRACT | Status: DC
  Administered 2021-09-13 – 2021-09-19 (×32): 2 via RESPIRATORY_TRACT
  Filled 2021-09-13: qty 12.9

## 2021-09-13 MED ORDER — MOMETASONE FURO-FORMOTEROL FUM 200-5 MCG/ACT IN AERO
2.0000 | INHALATION_SPRAY | Freq: Two times a day (BID) | RESPIRATORY_TRACT | Status: DC
  Administered 2021-09-14 – 2021-09-19 (×11): 2 via RESPIRATORY_TRACT
  Filled 2021-09-13: qty 8.8

## 2021-09-13 MED ORDER — AZITHROMYCIN 250 MG PO TABS
250.0000 mg | ORAL_TABLET | Freq: Every day | ORAL | Status: AC
Start: 2021-09-14 — End: 2021-09-17
  Administered 2021-09-14 – 2021-09-17 (×4): 250 mg via ORAL
  Filled 2021-09-13 (×4): qty 1

## 2021-09-13 NOTE — ED Triage Notes (Signed)
Pt comes into the ED via [facility] from home c/o sepsis.  Pt was initially called out for multiple falls with 3 times being today and 2 times yesterday.  Pt does take eliquis, but denies any LOC or hitting his head.  Pt c/o cough, weakness, and he took tylenol 2 hours ago.  EMS got 101.3 oral.  Pt also c/o central chest pain that is sharp which he feels is secondary to the cough.  Denies any N/V/D.  Pt has 20g Left AC, with 133ml of NaCl.  H/o CHF, COPD.   150 CBG 29 RR 113/39 88% RA, 2L @ 94%

## 2021-09-13 NOTE — ED Notes (Signed)
This RN to bedside to empty pt's urine suction canister. 859mL yellow urine. Pt. Is resting comfortably, call light and personal items in reach. This RN plugged in pt's phone to his charger for him, and placed phone within reach. Pt. Denies any further need. This RN verbalized to pt. That they will let him rest, and confirmed he knew where call light was if he needed anything. Pt. Verbalized understanding and returned demonstration of how to use call light.

## 2021-09-13 NOTE — ED Notes (Signed)
After triage pt still hooked up to monitor and HR increased to 140s. Pt denies any new complaints and HR is now back to 72. First RN informed pt is next bed

## 2021-09-13 NOTE — ED Provider Notes (Signed)
[facility] Provider Note    Event Date/Time   First MD Initiated Contact with Patient 09/13/21 1224     (approximate)   History   Weakness   HPI  [name] is a 76 y.o. male with a history of hypertension, paroxysmal atrial fibrillation, CLL who comes ED complaining of fever, cough, weakness, multiple falls at home.  Reportedly he was diagnosed with COVID about 10 days ago.  EMS reports that initial room air oxygen saturation was 88%, improved with 2 L nasal cannula.  Other family members have become sick with COVID as well over the past week.  Patient has had 4 previous COVID vaccines.  He is on Eliquis.     Physical Exam   Triage Vital Signs: ED Triage Vitals  Enc Vitals Group     BP 09/13/21 1126 125/67     Pulse Rate 09/13/21 1126 72     Resp 09/13/21 1126 (!) 22     Temp 09/13/21 1126 98.9 F (37.2 C)     Temp Source 09/13/21 1126 Oral     SpO2 09/13/21 1126 91 %     Weight 09/13/21 1124 219 lb 12.8 oz (99.7 kg)     Height 09/13/21 1124 5\' 9"  (1.753 m)     Head Circumference --      Peak Flow --      Pain Score 09/13/21 1123 7     Pain Loc --      Pain Edu? --      Excl. in [place]? --     Most recent vital signs: Vitals:   09/13/21 1131 09/13/21 1136  BP:    Pulse: (!) 140 80  Resp:    Temp:    SpO2:       General: Awake, no distress.  Ill-appearing CV:  Good peripheral perfusion.  Regular rate and rhythm, symmetric pulses Resp:  Normal effort.  Bibasilar crackles Abd:  No distention.  Soft and nontender Other:  No rash, no edema.   ED Results / Procedures / Treatments   Labs (all labs ordered are listed, but only abnormal results are displayed) Labs Reviewed  RESP PANEL BY RT-PCR (FLU A&B, COVID) ARPGX2 - Abnormal; Notable for the following components:      Result Value   SARS Coronavirus 2 by RT PCR POSITIVE (*)    All other components within normal limits  BASIC METABOLIC PANEL - Abnormal; Notable for the  following components:   Chloride 97 (*)    Glucose, Bld 130 (*)    BUN 24 (*)    Creatinine, Ser 1.25 (*)    Calcium 8.3 (*)    GFR, Estimated 60 (*)    All other components within normal limits  CBC - Abnormal; Notable for the following components:   WBC 10.9 (*)    RBC 3.89 (*)    MCV 103.9 (*)    Platelets 113 (*)    All other components within normal limits  URINALYSIS, ROUTINE W REFLEX MICROSCOPIC - Abnormal; Notable for the following components:   Color, Urine STRAW (*)    APPearance CLEAR (*)    All other components within normal limits  HEPATIC FUNCTION PANEL - Abnormal; Notable for the following components:   Total Bilirubin 1.8 (*)    Bilirubin, Direct 0.3 (*)    Indirect Bilirubin 1.5 (*)    All other components within normal limits  TROPONIN I (HIGH SENSITIVITY) - Abnormal; Notable for the following components:  Troponin I (High Sensitivity) 22 (*)    All other components within normal limits  LACTIC ACID, PLASMA  PROCALCITONIN  LACTIC ACID, PLASMA  CBG MONITORING, ED  TROPONIN I (HIGH SENSITIVITY)     EKG  Interpreted by me Normal sinus rhythm rate of 74, normal axis and intervals.  Normal QRS ST segments and T waves.   RADIOLOGY Chest x-ray viewed and interpreted by me, appears normal, unchanged from previous chest x-ray.  Radiology report reviewed.  CT angiogram chest pending   PROCEDURES:  Critical Care performed: No  .1-3 Lead EKG Interpretation Performed by: Carrie Mew, MD Authorized by: Carrie Mew, MD     Interpretation: normal     ECG rate:  70   ECG rate assessment: normal     Rhythm: sinus rhythm     Ectopy: none     Conduction: normal     MEDICATIONS ORDERED IN ED: Medications  remdesivir 200 mg in sodium chloride 0.9% 250 mL IVPB (has no administration in time range)    Followed by  remdesivir 100 mg in sodium chloride 0.9 % 100 mL IVPB (has no administration in time range)  sodium chloride 0.9 % bolus 500 mL  (500 mLs Intravenous New Bag/Given 09/13/21 1310)  albuterol (PROVENTIL) (2.5 MG/3ML) 0.083% nebulizer solution 2.5 mg (2.5 mg Nebulization Given 09/13/21 1307)  iohexol (OMNIPAQUE) 350 MG/ML injection 75 mL (75 mLs Intravenous Contrast Given 09/13/21 1420)     IMPRESSION / MDM / ASSESSMENT AND PLAN / ED COURSE  I reviewed the triage vital signs and the nursing notes.                              Differential diagnosis includes, but is not limited to, COVID pneumonia, PE, dehydration, electrolyte abnormality, non-STEMI, bacterial pneumonia  *The patient is on the cardiac monitor to evaluate for evidence of arrhythmia and/or significant heart rate changes.**}  Patient presents with worsening shortness of breath, cough, weakness with multiple falls at home in the setting of recent COVID diagnosis.  He is out of the eligible window for Paxlovid, but with his age, comorbidities, worsening symptoms, will plan to hospitalize and treat with remdesivir.  Due to the duration of his symptoms, nondiagnostic chest x-ray, will also need to obtain a CT angiogram of the chest to rule out PE.  Initial troponin is consistent with his chronic baseline.  Doubt ACS.  Not septic.      FINAL CLINICAL IMPRESSION(S) / ED DIAGNOSES   Final diagnoses:  COVID-19 virus infection  Generalized weakness  Acute respiratory failure with hypoxia ([place])     Rx / DC Orders   ED Discharge Orders     None        Note:  This document was prepared using Dragon voice recognition software and may include unintentional dictation errors.   Carrie Mew, MD 09/13/21 [phone]

## 2021-09-13 NOTE — ED Triage Notes (Signed)
Pt to ED via [facility] from home. Pt reports increased weakness and is unable to use arms and legs and cough. Pt reports 3 falls this morning and 2 falls yesterday. Pt reports CP and SOB.  Pt states he wears a CPAP at night. Pt tested COVID + last Wednesday. Pt 91% on RA and placed on 2L [place]. Pt hx HTN, COPD, CAD

## 2021-09-13 NOTE — H&P (Signed)
History and Physical    [name] NGE:[id number] DOB: 1946/03/05 DOA: 09/13/2021  Referring MD/NP/PA:   PCP: Leone [facility], MD   Patient coming from:  The patient is coming from home.    Chief Complaint: Fever, chills, cough, shortness of breath, chest pain, fall  HPI: [name] is a 76 y.o. male with medical history significant of hypertension, hyperlipidemia, COPD not on oxygen, TIA, GERD, depression with anxiety, PTSD, OSA on CPAP, pacemaker placement due to SSS, CAD, CLL, atrial fibrillation on Eliquis, dCHF, who presents with fever, chills, cough, shortness breath, chest pain and fall.  Patient states that he had a positive home COVID test about a week ago.  He still has fever, chills, dry cough, shortness of breath, chest pain.  His shortness breath has been progressively worsening.  His chest pain is located in front chest, constant, mild to moderate, nonradiating, pleuritic, aggravated by deep breath and coughing.  Patient has generalized weakness, no unilateral numbness or tinglings in extremities.  Patient states that he has had multiple full in the past several days.  No significant injury.  No headache or neck pain.  Patient does not have nausea, vomiting, diarrhea or abdominal pain.  No symptoms of UTI.  Data Reviewed and ED Course: pt was found to have positive COVID PCR, WBC 10.9, troponin level 22, 21, lactic acid 1.2, mild AKI with creatinine 1.25, BUN 24, GFR 60 (baseline creatinine 0.92 on 09/06/2021), temperature 101.3, blood pressure 125/67, heart rate 140, 80, RR 22, oxygen saturation 80% on room air initially, currently 91-92% on room air.  CT of head negative for acute intracranial abnormalities.  CT angiogram is negative for PE.  Chest x-ray is negative for obvious infiltration.  Patient is admitted to [facility] bed as inpatient.   EKG: I have personally reviewed.  Sinus rhythm, QTc 455, T wave inversion in V1-V2, ST depression in V3-V6.   Review of Systems:    General: Has fevers, chills, no body weight gain, has poor appetite, has fatigue HEENT: no blurry vision, hearing changes or sore throat Respiratory: Has dyspnea, coughing, wheezing CV: Has chest pain, no palpitations GI: no nausea, vomiting, abdominal pain, diarrhea, constipation GU: no dysuria, burning on urination, increased urinary frequency, hematuria  Ext: no leg edema Neuro: no unilateral weakness, numbness, or tingling, no vision change or hearing loss.  Has fall Skin: no rash, no skin tear. MSK: No muscle spasm, no deformity, no limitation of range of movement in spin Heme: No easy bruising.  Travel history: No recent long distant travel.   Allergy: No Known Allergies  Past Medical History:  Diagnosis Date   Anxiety    Arthritis    Atrial fibrillation ([place])    a. Dx 2013, recurred 02/2014, CHA2DS2VASc = 3 -->placed on Eliquis;  b. 02/2014 Echo: EF 50-55%, mid and apical anterior septum and mid and apical inf septum are abnl, mild to mod Ao sclerosis w/o AS.   Cancer associated pain    Chicken pox    Chronic lymphocytic leukemia ([place])    a. Dx 02/2014.   CLL (chronic lymphocytic leukemia) (HCC)    Complication of anesthesia    History of  PTSD--do not touch patient when waking up from surgery.   COPD (chronic obstructive pulmonary disease) (HCC)    Coronary artery disease    a. 04/2009 CABG x 3 (LIMA->LAD, VG->OM1, VG->PDA);  b. 09/2009 Cath: occluded VG x 2 w/ patent LIMA and L->R collats. EF 55%, mild antlat HK;  c. 10/2011 MV: EF 53%, no isch/infarct-->low risk.   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   TIA (transient ischemic attack) 11/02/2015    Past Surgical History:  Procedure Laterality Date   ABDOMINAL HERNIA REPAIR     APPENDECTOMY  06/21/1985   CARDIAC CATHETERIZATION  2010; 2011   ; Dr Fletcher Anon   CORONARY ARTERY BYPASS GRAFT  04/2009   "CABG X3"   EP IMPLANTABLE DEVICE N/A 03/03/2016   Procedure: Pacemaker Implant;  Surgeon: Deboraha Sprang, MD;  Location: [facility];  Service: Cardiovascular;  Laterality: N/A;   FOREIGN BODY REMOVAL  1968   "shrapnel in my tailbone"   INGUINAL HERNIA REPAIR Right    INSERT / REPLACE / [place] Right 2018   LAPAROSCOPIC CHOLECYSTECTOMY     RIGHT/LEFT HEART CATH AND CORONARY/GRAFT ANGIOGRAPHY N/A 02/04/2021   Procedure: RIGHT/LEFT HEART CATH AND CORONARY/GRAFT ANGIOGRAPHY;  Surgeon: Nelva Bush, MD;  Location: [facility];  Service: Cardiovascular;  Laterality: N/A;   TONSILLECTOMY AND ADENOIDECTOMY  1956   TOTAL HIP ARTHROPLASTY Right 10/22/2016   Procedure: TOTAL HIP ARTHROPLASTY;  Surgeon: Dereck Leep, MD;  Location: [facility];  Service: Orthopedics;  Laterality: Right;   TOTAL HIP ARTHROPLASTY Left 11/04/2017   Procedure: TOTAL HIP ARTHROPLASTY;  Surgeon: Dereck Leep, MD;  Location: [facility];  Service: Orthopedics;  Laterality: Left;    Social History:  reports that he quit smoking about 15 years ago. His smoking use included cigarettes. He has a 40.00 pack-year smoking history. He has never used smokeless tobacco. He reports that he does not currently use alcohol. He reports that he does not use drugs.  Family History:  Family History  Problem Relation Age of Onset   Heart disease Mother    Heart attack Mother    Coronary artery disease Other        family history     Prior to Admission medications   Medication Sig Start Date End Date Taking? Authorizing Provider  acetaminophen (TYLENOL) 500 MG tablet  Take 1,000 mg by mouth every 8 (eight) hours as needed for mild pain.    [provider]  acyclovir (ZOVIRAX) 400 MG tablet TAKE 1 TABLET(400 MG) BY MOUTH TWICE DAILY 08/26/21   Cammie Sickle, MD  albuterol (PROVENTIL HFA;VENTOLIN HFA) 108 (90 Base) MCG/ACT inhaler Inhale 2 puffs into the lungs every 6 (six) hours as needed for wheezing or shortness of breath. 12/01/16   Wilhelmina Mcardle, MD  Alirocumab (PRALUENT) 150 MG/ML SOAJ Inject 150 mg into the skin every 14 (fourteen) days. 03/26/21   [name], MD  amiodarone (PACERONE) 200 MG tablet Take 1/2 tablet (100 mg) by mouth twice daily 5 days a week 03/13/21   Deboraha Sprang, MD  apixaban (ELIQUIS) 5 MG TABS tablet Take 5 mg by mouth 2 (two) times daily.    [provider]  atorvastatin (LIPITOR) 80  MG tablet TAKE ONE TABLET BY MOUTH AT BEDTIME FOR CHOLESTEROL 08/07/20   [provider]  azelastine (ASTELIN) 0.1 % nasal spray Place 2 sprays into both nostrils 2 (two) times daily. Use in each nostril as directed 07/17/20   Leone [facility], MD  benzonatate (TESSALON) 100 MG capsule Take 1 capsule (100 mg total) by mouth 3 (three) times daily as needed for cough. 09/03/21   Borders, Kirt Boys, NP  budesonide-formoterol (SYMBICORT) 160-4.5 MCG/ACT inhaler Inhale 2 puffs into the lungs 2 (two) times daily. 12/01/16   Wilhelmina Mcardle, MD  cetirizine (ZYRTEC) 10 MG tablet Take 10 mg by mouth daily as needed for allergies.     [provider]  Coenzyme Q10 (COQ10) 200 MG CAPS Take 200 mg by mouth daily.    [provider]  ezetimibe (ZETIA) 10 MG tablet Take 1 tablet (10 mg total) by mouth daily. 01/06/17 11/01/27  [name], MD  furosemide (LASIX) 20 MG tablet Take 2 tablets (40 mg total) by mouth daily. Do not take on Thursdays 06/12/21   [name], MD  guaiFENesin-dextromethorphan (ROBITUSSIN DM) 100-10 MG/5ML syrup Take 5 mLs by mouth every 4 (four) hours as needed for cough. 09/06/21    Val Riles, MD  isosorbide mononitrate (IMDUR) 30 MG 24 hr tablet Take 30 mg by mouth daily. 07/28/21   [provider]  Javier Docker Oil 350 MG CAPS Take 350 mg by mouth daily as needed (Heart).    [provider]  mirtazapine (REMERON) 15 MG tablet Take 15 mg by mouth at bedtime as needed (for panic associated with PTSD).     [provider]  Multiple Vitamin (MULTIVITAMIN WITH MINERALS) TABS tablet Take 1 tablet by mouth daily.    [provider]  nitroGLYCERIN (NITROSTAT) 0.4 MG SL tablet Place 1 tablet (0.4 mg total) under the tongue every 5 (five) minutes as needed for chest pain. 01/06/17   [name], MD  pantoprazole (PROTONIX) 40 MG tablet Take 1 tablet (40 mg total) by mouth daily for 10 days. 09/07/21 09/17/21  Val Riles, MD  Tiotropium Bromide Monohydrate 2.5 MCG/ACT AERS Inhale 2 puffs into the lungs at bedtime. Spiriva 08/07/20   [provider]  traMADol (ULTRAM) 50 MG tablet Take 1 tablet (50 mg total) by mouth every 12 (twelve) hours as needed. 08/01/21   Cammie Sickle, MD  venetoclax (VENCLEXTA) 100 MG tablet TAKE 1 TABLET BY MOUTH ONCE DAILY 08/26/21 08/26/22  Cammie Sickle, MD    Physical Exam: Vitals:   09/13/21 1124 09/13/21 1126 09/13/21 1131 09/13/21 1136  BP:  125/67    Pulse:  72 (!) 140 80  Resp:  (!) 22    Temp:  98.9 F (37.2 C)    TempSrc:  Oral    SpO2:  91%    Weight: 99.7 kg     Height: 5\' 9"  (1.753 m)      General: Not in acute distress HEENT:       Eyes: PERRL, EOMI, no scleral icterus.       ENT: No discharge from the ears and nose, no pharynx injection, no tonsillar enlargement.        Neck: No JVD, no bruit, no mass felt. Heme: No neck lymph node enlargement. Cardiac: S1/S2, RRR, No murmurs, No gallops or rubs. Respiratory: Has coarse breathing sound bilaterally, has mild wheezing bilaterally GI: Soft, nondistended, nontender, no rebound pain, no organomegaly, BS present. GU: No  hematuria Ext:  No pitting leg edema bilaterally. 1+DP/PT pulse bilaterally. Musculoskeletal: No joint deformities, No joint redness or warmth, no limitation of ROM in spin. Skin: No rashes.  Neuro: Alert, oriented X3, cranial nerves II-XII grossly intact, moves all extremities normally.  Psych: Patient is not psychotic, no suicidal or hemocidal ideation.  Labs on Admission: I have personally reviewed following labs and imaging studies  CBC: Recent Labs  Lab 09/13/21 1122  WBC 10.9*  HGB 13.2  HCT 40.4  MCV 103.9*  PLT 161*   Basic Metabolic Panel: Recent Labs  Lab 09/13/21 1122  NA 136  K 3.7  CL 97*  CO2 27  GLUCOSE 130*  BUN 24*  CREATININE 1.25*  CALCIUM 8.3*   GFR: Estimated Creatinine Clearance: 58.5 mL/min (A) (by C-G formula based on SCr of 1.25 mg/dL (H)). Liver Function Tests: Recent Labs  Lab 09/13/21 1122  AST 29  ALT 27  ALKPHOS 73  BILITOT 1.8*  PROT 6.9  ALBUMIN 3.8   No results for input(s): LIPASE, AMYLASE in the last 168 hours. No results for input(s): AMMONIA in the last 168 hours. Coagulation Profile: No results for input(s): INR, PROTIME in the last 168 hours. Cardiac Enzymes: No results for input(s): CKTOTAL, CKMB, CKMBINDEX, TROPONINI in the last 168 hours. BNP (last 3 results) No results for input(s): PROBNP in the last 8760 hours. HbA1C: No results for input(s): HGBA1C in the last 72 hours. CBG: No results for input(s): GLUCAP in the last 168 hours. Lipid Profile: No results for input(s): CHOL, HDL, LDLCALC, TRIG, CHOLHDL, LDLDIRECT in the last 72 hours. Thyroid Function Tests: No results for input(s): TSH, T4TOTAL, FREET4, T3FREE, THYROIDAB in the last 72 hours. Anemia Panel: Recent Labs    09/13/21 1550  FERRITIN 196   Urine analysis:    Component Value Date/Time   COLORURINE STRAW (A) 09/13/2021 1206   APPEARANCEUR CLEAR (A) 09/13/2021 1206   APPEARANCEUR Clear 10/11/2014 2003   LABSPEC 1.009 09/13/2021 1206    LABSPEC 1.017 10/11/2014 2003   PHURINE 7.0 09/13/2021 1206   GLUCOSEU NEGATIVE 09/13/2021 1206   GLUCOSEU Negative 10/11/2014 2003   HGBUR NEGATIVE 09/13/2021 1206   BILIRUBINUR NEGATIVE 09/13/2021 1206   BILIRUBINUR Negative 10/11/2014 2003   KETONESUR NEGATIVE 09/13/2021 1206   PROTEINUR NEGATIVE 09/13/2021 1206   UROBILINOGEN 1.0 05/12/2009 1426   NITRITE NEGATIVE 09/13/2021 1206   LEUKOCYTESUR NEGATIVE 09/13/2021 1206   LEUKOCYTESUR Negative 10/11/2014 2003   Sepsis Labs: @LABRCNTIP (procalcitonin:4,lacticidven:4) ) Recent Results (from the past 240 hour(s))  Resp Panel by RT-PCR (Flu A&B, Covid) Nasopharyngeal Swab     Status: Abnormal   Collection Time: 09/04/21 12:15 AM   Specimen: Nasopharyngeal Swab; Nasopharyngeal(NP) swabs in vial transport medium  Result Value Ref Range Status   SARS Coronavirus 2 by RT PCR POSITIVE (A) NEGATIVE Final    Comment: (NOTE) SARS-CoV-2 target nucleic acids are DETECTED.  The SARS-CoV-2 RNA is generally detectable in upper respiratory specimens during the acute phase of infection. Positive results are indicative of the presence of the identified virus, but do not rule out bacterial infection or co-infection with other pathogens not detected by the test. Clinical correlation with patient history and other diagnostic information is necessary to determine patient infection status. The expected result is Negative.  Fact Sheet for Patients: EntrepreneurPulse.com.au  Fact Sheet for Healthcare Providers: IncredibleEmployment.be  This test is not yet approved or cleared by the [place] FDA and  has been authorized for detection and/or diagnosis of SARS-CoV-2 by FDA under an  Emergency Use Authorization (EUA).  This EUA will remain in effect (meaning this test can be used) for the duration of  the COVID-19 declaration under Section 564(b)(1) of the A ct, 21 U.S.C. section 360bbb-3(b)(1), unless the  authorization is terminated or revoked sooner.     Influenza A by PCR NEGATIVE NEGATIVE Final   Influenza B by PCR NEGATIVE NEGATIVE Final    Comment: (NOTE) The Xpert Xpress SARS-CoV-2/FLU/RSV plus assay is intended as an aid in the diagnosis of influenza from Nasopharyngeal swab specimens and should not be used as a sole basis for treatment. Nasal washings and aspirates are unacceptable for Xpert Xpress SARS-CoV-2/FLU/RSV testing.  Fact Sheet for Patients: EntrepreneurPulse.com.au  Fact Sheet for Healthcare Providers: IncredibleEmployment.be  This test is not yet approved or cleared by the [place] FDA and has been authorized for detection and/or diagnosis of SARS-CoV-2 by FDA under an Emergency Use Authorization (EUA). This EUA will remain in effect (meaning this test can be used) for the duration of the COVID-19 declaration under Section 564(b)(1) of the Act, 21 U.S.C. section 360bbb-3(b)(1), unless the authorization is terminated or revoked.  Performed at [facility], [address] [place], [place] [address]   Resp Panel by RT-PCR (Flu A&B, Covid) Nasopharyngeal Swab     Status: Abnormal   Collection Time: 09/13/21 12:46 PM   Specimen: Nasopharyngeal Swab; Nasopharyngeal(NP) swabs in vial transport medium  Result Value Ref Range Status   SARS Coronavirus 2 by RT PCR POSITIVE (A) NEGATIVE Final    Comment: (NOTE) SARS-CoV-2 target nucleic acids are DETECTED.  The SARS-CoV-2 RNA is generally detectable in upper respiratory specimens during the acute phase of infection. Positive results are indicative of the presence of the identified virus, but do not rule out bacterial infection or co-infection with other pathogens not detected by the test. Clinical correlation with patient history and other diagnostic information is necessary to determine patient infection status. The expected result is Negative.  Fact Sheet for  Patients: EntrepreneurPulse.com.au  Fact Sheet for Healthcare Providers: IncredibleEmployment.be  This test is not yet approved or cleared by the [place] FDA and  has been authorized for detection and/or diagnosis of SARS-CoV-2 by FDA under an Emergency Use Authorization (EUA).  This EUA will remain in effect (meaning this test can be used) for the duration of  the COVID-19 declaration under Section 564(b)(1) of the A ct, 21 U.S.C. section 360bbb-3(b)(1), unless the authorization is terminated or revoked sooner.     Influenza A by PCR NEGATIVE NEGATIVE Final   Influenza B by PCR NEGATIVE NEGATIVE Final    Comment: (NOTE) The Xpert Xpress SARS-CoV-2/FLU/RSV plus assay is intended as an aid in the diagnosis of influenza from Nasopharyngeal swab specimens and should not be used as a sole basis for treatment. Nasal washings and aspirates are unacceptable for Xpert Xpress SARS-CoV-2/FLU/RSV testing.  Fact Sheet for Patients: EntrepreneurPulse.com.au  Fact Sheet for Healthcare Providers: IncredibleEmployment.be  This test is not yet approved or cleared by the [place] FDA and has been authorized for detection and/or diagnosis of SARS-CoV-2 by FDA under an Emergency Use Authorization (EUA). This EUA will remain in effect (meaning this test can be used) for the duration of the COVID-19 declaration under Section 564(b)(1) of the Act, 21 U.S.C. section 360bbb-3(b)(1), unless the authorization is terminated or revoked.  Performed at [facility], [address], [place], [place] [address]      Radiological Exams on Admission: DG Chest 2 View  Result Date:  09/13/2021 CLINICAL DATA:  76 year old male with weakness, falls. EXAM: CHEST - 2 VIEW COMPARISON:  09/03/2021 FINDINGS: The mediastinal contours are within normal limits. No cardiomegaly. Atherosclerotic calcification of the aortic arch.  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 Mild bibasilar streaky opacities. The lungs are otherwise clear bilaterally without evidence of focal consolidation, pleural effusion, or pneumothorax. No acute osseous abnormality. IMPRESSION: 1. Mild bibasilar subsegmental atelectasis. 2.  Aortic Atherosclerosis (ICD10-I70.0). Electronically Signed   By: Ruthann Cancer M.D.   On: 09/13/2021 12:44   CT Head Wo Contrast  Result Date: 09/13/2021 CLINICAL DATA:  Multiple falls, chest pain, history atrial fibrillation, COPD, CLL, coronary artery disease post MI, hypertension EXAM: CT HEAD WITHOUT CONTRAST TECHNIQUE: Contiguous axial images were obtained from the base of the skull through the vertex without intravenous contrast. RADIATION DOSE REDUCTION: This exam was performed according to the departmental dose-optimization program which includes automated exposure control, adjustment of the mA and/or kV according to patient size and/or use of iterative reconstruction technique. COMPARISON:  11/02/2015 FINDINGS: Brain: Generalized atrophy. Normal ventricular morphology. No midline shift or mass effect. Small vessel chronic ischemic changes of deep cerebral white matter. No intracranial hemorrhage, mass lesion, evidence of acute infarction, or extra-axial fluid collection. Vascular: No hyperdense vessels. Atherosclerotic calcifications of internal carotid arteries bilaterally at skull base. Skull: Intact Sinuses/Orbits: Diffuse mucosal thickening throughout paranasal sinuses with suspected mucosal retention cysts within sphenoid and maxillary sinuses Other: N/A IMPRESSION: Generalized atrophy with mild small vessel chronic ischemic changes of deep cerebral white matter. No acute intracranial abnormalities. Chronic sinus disease changes. Electronically Signed   By: Lavonia Dana M.D.   On: 09/13/2021 14:56   CT Angio Chest Pulmonary Embolism (PE) W or WO  Contrast  Result Date: 09/13/2021 CLINICAL DATA:  Chest pain, history of multiple falls, question pulmonary embolism EXAM: CT ANGIOGRAPHY CHEST WITH CONTRAST TECHNIQUE: Multidetector CT imaging of the chest was performed using the standard protocol during bolus administration of intravenous contrast. Multiplanar CT image reconstructions and MIPs were obtained to evaluate the vascular anatomy. RADIATION DOSE REDUCTION: This exam was performed according to the departmental dose-optimization program which includes automated exposure control, adjustment of the mA and/or kV according to patient size and/or use of iterative reconstruction technique. CONTRAST:  67mL OMNIPAQUE IOHEXOL 350 MG/ML SOLN COMPARISON:  CT chest 05/22/2021 FINDINGS: Cardiovascular: Atherosclerotic calcifications aorta, proximal great vessels, coronary arteries. Aorta normal caliber. No aneurysm or dissection. Mild enlargement of cardiac chambers. No pericardial effusion. 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 Respiratory motion artifacts in the mid to lower lungs. Pulmonary arteries patent. No evidence of pulmonary embolism. Mediastinum/Nodes: Esophagus unremarkable. Base of cervical region normal appearance. Multiple normal and upper normal sized axillary lymph nodes bilaterally. Lungs/Pleura: Dependent atelectasis in the BILATERAL lower lobes. Subsegmental atelectasis in lingula. No acute infiltrate, pleural effusion, or pneumothorax. Upper Abdomen: Gallbladder surgically absent. Visualized upper abdomen unremarkable Musculoskeletal: No acute osseous findings. Review of the MIP images confirms the above findings. IMPRESSION: No evidence of pulmonary embolism. Scattered atherosclerotic calcifications including coronary arteries. Bibasilar atelectasis. Aortic Atherosclerosis (ICD10-I70.0). Electronically Signed   By: Lavonia Dana M.D.   On: 09/13/2021 15:07      Assessment/Plan Principal Problem:   Acute respiratory disease due to  COVID-19 virus Active Problems:   Hyperlipidemia   Essential hypertension   Paroxysmal atrial fibrillation (HCC)   COPD (chronic obstructive pulmonary disease) (HCC)   CLL (chronic lymphocytic leukemia) (HCC)   Chronic diastolic  CHF (congestive heart failure) (HCC)   OSA on CPAP   Thrombocytopenia (HCC)   CAD (coronary artery disease)   Elevated troponin   Fall at home, initial encounter   Sepsis ([place])   AKI (acute kidney injury) ([place])  Sepsis due to acute respiratory disease due to COVID-19 virus: CT angiogram is negative for PE.  No obvious infiltration on chest x-ray.  Patient had oxygen desaturating to 88% on room air initially, currently 91-92% on room air..  Patient has sepsis with tachycardia with heart rate up to 140, tachypnea with RR 22.  Lactic acid is normal.  Currently hemodynamically stable.  Patient received 1 dose of remdesivir in ED.  Since patient is out of window for treatment with remdesivir, will discontinue it.  -will admit to med-surg bed as inpt -Solumedrol 40 mg bid -Started Z-Pak empirically for COPD exacerbation -Bronchodilators -PRN Mucinex for cough -f/u Blood culture -Gentle IV fluid:  500 cc of NS -f/u inflammatory marker, CRP -Will ask the patient to maintain an awake prone position for 16+ hours a day, if possible, with a minimum of 2-3 hours at a time -Will attempt to maintain euvolemia to a net negative fluid status  Hyperlipidemia -Zetia and lipitor  Essential hypertension -IV hydralazine as needed -Hold lasix due to AKI  Paroxysmal atrial fibrillation (HCC) -Eliquis -Amiodarone  COPD (chronic obstructive pulmonary disease) ([place]): Patient has mild wheezing indicating mild COPD exacerbation which is triggered by COVID infection -Bronchodilators -Solu-Medrol as above -Z-Pak  Chronic diastolic CHF (congestive heart failure) ([place]): 2D echo on 02/19/2021 showed EF of 55 to 60% with grade 1 diastolic dysfunction.  Patient does not have leg  edema.  CHF seem to be compensated. -Hold Lasix due to AKI  OSA  -CPAP  Thrombocytopenia ([place]): This is chronic issue.  May be related to CLL.  Platelet 113.  No active bleeding -Follow-up with CBC  CAD (coronary artery disease) and Elevated troponin: Troponin level minimally elevated 22, already trending down to 21.  Patient has a pleuritic chest pain, which is likely due to COVID infection. -Continue Lipitor, imdur, as needed nitroglycerin  Fall at home, initial encounter -PT/OT  AKI (acute kidney injury) ([place]): Mild. -Hold Lasix -500 cc normal saline  CLL: WBC 10.9 -f/u with Dr. Rogue Bussing        DVT ppx: On Eliquis  Code Status: DNR per pt and his daughter  Family Communication:    Yes, patient's  daughter  at bed side.       Disposition Plan:  Anticipate discharge back to previous environment  Consults called:  none  Admission status and Level of care: Telemetry Medical:   as inpt       Severity of Illness:  The appropriate patient status for this patient is INPATIENT. Inpatient status is judged to be reasonable and necessary in order to provide the required intensity of service to ensure the patient's safety. The patient's presenting symptoms, physical exam findings, and initial radiographic and laboratory data in the context of their chronic comorbidities is felt to place them at high risk for further clinical deterioration. Furthermore, it is not anticipated that the patient will be medically stable for discharge from the hospital within 2 midnights of admission.   * I certify that at the point of admission it is my clinical judgment that the patient will require inpatient hospital care spanning beyond 2 midnights from the point of admission due to high intensity of service, high risk for further deterioration and high frequency  of surveillance required.*       Date of Service 09/13/2021    Ivor Costa [facility]   If 7PM-7AM, please contact  night-coverage [email or web address] 09/13/2021, 7:03 PM

## 2021-09-13 NOTE — Telephone Encounter (Signed)
Called and spoke to patient. Pt is bring sd card to upcoming appt.

## 2021-09-13 NOTE — Telephone Encounter (Signed)
I received a call from answering service stating that wife called reporting that she had called EMS to take patient to hospital for confusion and shortness of breath.

## 2021-09-13 NOTE — Progress Notes (Signed)
PHARMACIST - PHYSICIAN ORDER COMMUNICATION  CONCERNING: P&T Medication Policy on Herbal Medications  DESCRIPTION:  This patients order for:  CoQ10 CAPS 200 mg  has been noted.  This product(s) is classified as an herbal or natural product. Due to a lack of definitive safety studies or FDA approval, nonstandard manufacturing practices, plus the potential risk of unknown drug-drug interactions while on inpatient medications, the Pharmacy and Therapeutics Committee does not permit the use of herbal or natural products of this type within [facility].   ACTION TAKEN: The pharmacy department is unable to verify this order at this time.  Please reevaluate patients clinical condition at discharge and address if the herbal or natural product(s) should be resumed at that time.   Renda Rolls, PharmD, [facility] 09/13/2021 11:06 PM

## 2021-09-13 NOTE — Consult Note (Signed)
Remdesivir - Pharmacy Brief Note   O:  ALT: WNL CXR: Lungs clear bilaterally without evidence of focal consolidation, pleural effusion, or pneumothorax.  SpO2: 91% on 2 L   A/P:  Remdesivir 200 mg IVPB once followed by 100 mg IVPB daily x 4 days.   Dorothe Pea, PharmD, BCPS Clinical Pharmacist   09/13/2021 1:46 PM

## 2021-09-14 DIAGNOSIS — C9110 Chronic lymphocytic leukemia of B-cell type not having achieved remission: Secondary | ICD-10-CM

## 2021-09-14 DIAGNOSIS — U071 COVID-19: Secondary | ICD-10-CM | POA: Diagnosis not present

## 2021-09-14 DIAGNOSIS — J441 Chronic obstructive pulmonary disease with (acute) exacerbation: Secondary | ICD-10-CM

## 2021-09-14 DIAGNOSIS — J069 Acute upper respiratory infection, unspecified: Secondary | ICD-10-CM | POA: Diagnosis not present

## 2021-09-14 LAB — COMPREHENSIVE METABOLIC PANEL
ALT: 30 U/L (ref 0–44)
AST: 27 U/L (ref 15–41)
Albumin: 3.4 g/dL — ABNORMAL LOW (ref 3.5–5.0)
Alkaline Phosphatase: 68 U/L (ref 38–126)
Anion gap: 11 (ref 5–15)
BUN: 24 mg/dL — ABNORMAL HIGH (ref 8–23)
CO2: 25 mmol/L (ref 22–32)
Calcium: 8 mg/dL — ABNORMAL LOW (ref 8.9–10.3)
Chloride: 100 mmol/L (ref 98–111)
Creatinine, Ser: 0.88 mg/dL (ref 0.61–1.24)
GFR, Estimated: >60 mL/min (ref >60)
Glucose, Bld: 182 mg/dL — ABNORMAL HIGH (ref 70–99)
Potassium: 4.1 mmol/L (ref 3.5–5.1)
Sodium: 136 mmol/L (ref 135–145)
Total Bilirubin: 1.6 mg/dL — ABNORMAL HIGH (ref 0.3–1.2)
Total Protein: 6.2 g/dL — ABNORMAL LOW (ref 6.5–8.1)

## 2021-09-14 LAB — CBC WITH DIFFERENTIAL/PLATELET
Abs Immature Granulocytes: 0.07 10*3/uL (ref 0.00–0.07)
Basophils Absolute: 0 10*3/uL (ref 0.0–0.1)
Basophils Relative: 0 %
Eosinophils Absolute: 0 10*3/uL (ref 0.0–0.5)
Eosinophils Relative: 0 %
HCT: 39.2 % (ref 39.0–52.0)
Hemoglobin: 12.8 g/dL — ABNORMAL LOW (ref 13.0–17.0)
Immature Granulocytes: 1 %
Lymphocytes Relative: 23 %
Lymphs Abs: 1.7 10*3/uL (ref 0.7–4.0)
MCH: 33.3 pg (ref 26.0–34.0)
MCHC: 32.7 g/dL (ref 30.0–36.0)
MCV: 102.1 fL — ABNORMAL HIGH (ref 80.0–100.0)
Monocytes Absolute: 0.3 10*3/uL (ref 0.1–1.0)
Monocytes Relative: 4 %
Neutro Abs: 5.2 10*3/uL (ref 1.7–7.7)
Neutrophils Relative %: 72 %
Platelets: 110 10*3/uL — ABNORMAL LOW (ref 150–400)
RBC: 3.84 MIL/uL — ABNORMAL LOW (ref 4.22–5.81)
RDW: 14.6 % (ref 11.5–15.5)
WBC: 7.2 10*3/uL (ref 4.0–10.5)
nRBC: 0 % (ref 0.0–0.2)

## 2021-09-14 LAB — C-REACTIVE PROTEIN: CRP: 11.7 mg/dL — ABNORMAL HIGH (ref <1.0)

## 2021-09-14 MED ORDER — MORPHINE SULFATE (PF) 2 MG/ML IV SOLN
1.0000 mg | INTRAVENOUS | Status: DC | PRN
  Administered 2021-09-14 – 2021-09-18 (×4): 1 mg via INTRAVENOUS
  Filled 2021-09-14 (×4): qty 1

## 2021-09-14 MED ORDER — ALPRAZOLAM 0.5 MG PO TABS
0.5000 mg | ORAL_TABLET | Freq: Two times a day (BID) | ORAL | Status: DC | PRN
  Administered 2021-09-14: 0.5 mg via ORAL
  Filled 2021-09-14: qty 1

## 2021-09-14 NOTE — NC FL2 (Signed)
[place] LEVEL OF CARE SCREENING TOOL     IDENTIFICATION  Patient Name: [name] Birthdate: [birth year] Sex: male Admission Date (Current Location): 09/13/2021  [facility] and [place] Number:  Engineering geologist and Address:  [facility], [address], [place],  [address]      Provider Number: [id number]  Attending Physician Name and Address:  Wyvonnia Dusky, MD  Relative Name and Phone Number:  Naoma Diener, Daughter [phone]    Current Level of Care: Hospital Recommended Level of Care: [place] Prior Approval Number:    Date Approved/Denied:   PASRR Number: [id number]  Discharge Plan: SNF    Current Diagnoses: Patient Active Problem List   Diagnosis Date Noted   Acute respiratory disease due to COVID-19 virus 09/13/2021   Elevated troponin 09/13/2021   Fall at home, initial encounter 09/13/2021   Sepsis ([place]) 09/13/2021   AKI (acute kidney injury) ([place]) 09/13/2021   Weakness 09/04/2021   Acute respiratory failure with hypoxia ([place]) 09/04/2021   COVID-19 virus infection 09/04/2021   COPD exacerbation ([place]) 09/03/2021   Accelerating angina ([place]) 02/04/2021   Abnormal stress test    Cardiomyopathy ([place])    Wasp sting 12/25/2020   Chronic rhinitis 07/17/2020   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   CAD (coronary artery disease)    Depression    Arthritis 09/16/2019   Elevated glucose 05/13/2019   Dehydration 03/14/2019   Sick sinus syndrome ([place]) 07/08/2016   Goals of care, counseling/discussion 07/08/2016   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   Chronic fatigue    Thrombocytopenia ([place]) 02/29/2016   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    PAD (peripheral artery disease) (HCC)    OSA on CPAP    Chronic diastolic CHF (congestive heart failure) ([place]) 09/20/2015   CLL (chronic lymphocytic leukemia) (HCC)    PTSD (post-traumatic  stress disorder)    Osteoarthritis of both knees 07/05/2015   COPD (chronic obstructive pulmonary disease) ([place]) 01/01/2012   Shortness of breath 10/09/2011   Paroxysmal atrial fibrillation ([place]) 10/09/2011   Hyperlipidemia 11/28/2009   Essential hypertension 11/28/2009    Orientation RESPIRATION BLADDER Height & Weight     Self, Place, Situation, Time  O2 (2 Liters) Incontinent Weight: 219 lb 12.8 oz (99.7 kg) Height:  5\' 9"  (175.3 cm)  BEHAVIORAL SYMPTOMS/MOOD NEUROLOGICAL BOWEL NUTRITION STATUS      Incontinent Diet (Heart Healthy)  AMBULATORY STATUS COMMUNICATION OF NEEDS Skin   Extensive Assist Verbally Normal                       Personal Care Assistance Level of Assistance  Bathing, Feeding, Dressing Bathing Assistance: Limited assistance Feeding assistance: Independent Dressing Assistance: Limited assistance     Functional Limitations Info  Sight, Hearing, Speech Sight Info: Adequate Hearing Info: Impaired (hearing aides) Speech Info: Adequate    SPECIAL CARE FACTORS FREQUENCY  PT (By licensed PT), OT (By licensed OT)     PT Frequency: Min 2X/week OT Frequency: Min 2X/week            Contractures Contractures Info: Not present    Additional Factors Info  Code Status, Allergies Code Status Info: DNR Allergies Info: No known allergies listed           Current Medications (09/14/2021):  This is the current hospital active medication list Current Facility-Administered Medications  Medication Dose Route Frequency Provider Last Rate Last Admin  acetaminophen (TYLENOL) tablet 650 mg  650 mg Oral Q6H PRN Ivor Costa, MD       acyclovir (ZOVIRAX) 200 MG capsule 400 mg  400 mg Oral BID Ivor Costa, MD   400 mg at 09/14/21 0924   albuterol (VENTOLIN HFA) 108 (90 Base) MCG/ACT inhaler 2 puff  2 puff Inhalation Q4H PRN Ivor Costa, MD       ALPRAZolam Duanne Moron) tablet 0.5 mg  0.5 mg Oral BID PRN Wyvonnia Dusky, MD       amiodarone (PACERONE) tablet 100  mg  100 mg Oral 2 times per day on Mon Tue Thu Fri Sat Ivor Costa, MD   100 mg at 09/14/21 0935   apixaban (ELIQUIS) tablet 5 mg  5 mg Oral BID Ivor Costa, MD   5 mg at 09/14/21 4235   atorvastatin (LIPITOR) tablet 80 mg  80 mg Oral QHS Ivor Costa, MD   80 mg at 09/13/21 2325   azelastine (ASTELIN) 0.1 % nasal spray 2 spray  2 spray Each Nare BID Ivor Costa, MD   2 spray at 09/14/21 3614   azithromycin (ZITHROMAX) tablet 250 mg  250 mg Oral Daily Ivor Costa, MD   250 mg at 09/14/21 1214   dextromethorphan-guaiFENesin ([place] DM) 30-600 MG per 12 hr tablet 1 tablet  1 tablet Oral BID PRN Ivor Costa, MD   1 tablet at 09/14/21 0456   ezetimibe (ZETIA) tablet 10 mg  10 mg Oral Daily Ivor Costa, MD   10 mg at 09/14/21 1004   hydrALAZINE (APRESOLINE) injection 5 mg  5 mg Intravenous Q2H PRN Ivor Costa, MD       ipratropium (ATROVENT HFA) inhaler 2 puff  2 puff Inhalation Q4H Ivor Costa, MD   2 puff at 09/14/21 1214   isosorbide mononitrate (IMDUR) 24 hr tablet 30 mg  30 mg Oral Daily Ivor Costa, MD   30 mg at 09/14/21 4315   loratadine (CLARITIN) tablet 10 mg  10 mg Oral Daily PRN Ivor Costa, MD       methylPREDNISolone sodium succinate (SOLU-MEDROL) 125 mg/2 mL injection 80 mg  80 mg Intravenous Q24H Dorothe Pea, RPH   80 mg at 09/13/21 1717   mirtazapine (REMERON) tablet 15 mg  15 mg Oral QHS PRN Ivor Costa, MD       mometasone-formoterol [facility]) 200-5 MCG/ACT inhaler 2 puff  2 puff Inhalation BID Ivor Costa, MD   2 puff at 09/14/21 0926   morphine (PF) 2 MG/ML injection 1 mg  1 mg Intravenous Q3H PRN Wyvonnia Dusky, MD   1 mg at 09/14/21 1004   multivitamin with minerals tablet 1 tablet  1 tablet Oral Daily Ivor Costa, MD   1 tablet at 09/14/21 4008   nitroGLYCERIN (NITROSTAT) SL tablet 0.4 mg  0.4 mg Sublingual Q5 min PRN Ivor Costa, MD       ondansetron [facility]) injection 4 mg  4 mg Intravenous Q8H PRN Ivor Costa, MD       pantoprazole (PROTONIX) EC tablet 40 mg  40 mg Oral Daily Ivor Costa, MD   40 mg at 09/14/21 6761   traMADol (ULTRAM) tablet 50 mg  50 mg Oral Q12H PRN Ivor Costa, MD       venetoclax [facility]) tablet 100 mg  100 mg Oral Q breakfast Ivor Costa, MD         Discharge Medications: Please see discharge summary for a list of discharge medications.  Relevant Imaging Results:  Relevant  Lab Results:   Additional Information SSN# [id number]. Patient has recevied all covid vaccines plus boosters  Raina Mina, LCSWA

## 2021-09-14 NOTE — Progress Notes (Signed)
PROGRESS NOTE    [name]  PIR:[id number] DOB: 1946-06-25 DOA: 09/13/2021 PCP: Leone Haven, MD   Assessment & Plan:   Principal Problem:   Acute respiratory disease due to COVID-19 virus Active Problems:   Hyperlipidemia   Essential hypertension   Paroxysmal atrial fibrillation (HCC)   COPD (chronic obstructive pulmonary disease) (HCC)   CLL (chronic lymphocytic leukemia) (HCC)   Chronic diastolic CHF (congestive heart failure) (HCC)   OSA on CPAP   Thrombocytopenia (HCC)   CAD (coronary artery disease)   Elevated troponin   Fall at home, initial encounter   Sepsis ([place])   AKI (acute kidney injury) ([place])   Sepsis: met criteria w/ tachycardia, tachypnea & secondary to COVID-19 virus. CT angiogram is negative for PE.  No obvious infiltration on chest x-ray.  Blood cxs are pending  Acute hypoxic respiratory failure: had oxygen desaturation to 88% on RA. Likely secondary to [place]. Continue on supplemental oxygen and wean as tolerated   COVID19: initially tested positive on 09/01/21 at home, possibly long hauler. Continue on IV steroids, bronchodilators & encourage incentive spirometry. Continue on airborne and contact precautions. Out of window of remdesivir, so it was d/c on admission   HLD: continue on statin, zetia    HTN: continue on imdur    PAF: continue on amiodarone, eliquis    COPD exacerbation: continue on steroids, azithromycin & bronchodilators. Encourage incentive spirometry    Chronic diastolic YTK:ZSWF on 0/03/3234 showed EF of 55 to 60% with grade 1 diastolic dysfunction. CHF seem to be compensated. Monitor I/Os  Anxiety: severity unknown. 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 Hx of CAD. Continue on statin, imdur & nitro prn    Fall: at home. PT/OT consulted    AKI: resolved    CLL: management per onco outpatient      DVT  prophylaxis: eliquis  Code Status: DNR Family Communication: discussed pt's care w/ pt's daughter and answered her questions  Disposition Plan: likely d/c to SNF   Level of care: Telemetry Medical  Status is: Inpatient Remains inpatient appropriate because: severity of illness, requiring IV steroids & oxygen     Consultants:    Procedures:   Antimicrobials: azithromycin    Subjective: Pt c/o shortness of breath   Objective: Vitals:   09/14/21 0030 09/14/21 0135 09/14/21 0455 09/14/21 0455  BP: 107/66 117/67  (!) 115/57  Pulse: 60 62  62  Resp: _0 Temp: 98.3 F (36.8 C)  (!) 97.5 F (36.4 C)   TempSrc:   Oral   SpO2: 96% 95%  95%  Weight:      Height:        Intake/Output Summary (Last 24 hours) at 09/14/2021 0803 Last data filed at 09/14/2021 5732 Gross per 24 hour  Intake 790 ml  Output 1650 ml  Net -860 ml   Filed Weights   09/13/21 1124  Weight: 99.7 kg    Examination:  General exam: Appears uncomfortable  Respiratory system: diminished breath sounds b/l Cardiovascular system: S1 & S2+.  No  rubs, gallops or clicks.  Gastrointestinal system: Abdomen is nondistended, soft and nontender. Normal bowel sounds heard. Central nervous system: Alert and oriented. Moves all extremities  Psychiatry: Judgement and insight appear normal. Anxious mood and affect     Data Reviewed: I have personally reviewed following labs and imaging studies  CBC: Recent Labs  Lab 09/13/21 1122 09/14/21 0505  WBC 10.9* 7.2  NEUTROABS  --  5.2  HGB 13.2 12.8*  HCT 40.4 39.2  MCV 103.9* 102.1*  PLT 113* 557*   Basic Metabolic Panel: Recent Labs  Lab 09/13/21 1122 09/14/21 0505  NA 136 136  K 3.7 4.1  CL 97* 100  CO2 27 25  GLUCOSE 130* 182*  BUN 24* 24*  CREATININE 1.25* 0.88  CALCIUM 8.3* 8.0*   GFR: Estimated Creatinine Clearance: 83.1 mL/min (by C-G formula based on SCr of 0.88 mg/dL). Liver Function Tests: Recent Labs  Lab 09/13/21 1122  09/14/21 0505  AST 29 27  ALT 27 30  ALKPHOS 73 68  BILITOT 1.8* 1.6*  PROT 6.9 6.2*  ALBUMIN 3.8 3.4*   No results for input(s): LIPASE, AMYLASE in the last 168 hours. No results for input(s): AMMONIA in the last 168 hours. Coagulation Profile: No results for input(s): INR, PROTIME in the last 168 hours. Cardiac Enzymes: No results for input(s): CKTOTAL, CKMB, CKMBINDEX, TROPONINI in the last 168 hours. BNP (last 3 results) No results for input(s): PROBNP in the last 8760 hours. HbA1C: No results for input(s): HGBA1C in the last 72 hours. CBG: No results for input(s): GLUCAP in the last 168 hours. Lipid Profile: No results for input(s): CHOL, HDL, LDLCALC, TRIG, CHOLHDL, LDLDIRECT in the last 72 hours. Thyroid Function Tests: No results for input(s): TSH, T4TOTAL, FREET4, T3FREE, THYROIDAB in the last 72 hours. Anemia Panel: Recent Labs    09/13/21 1550  FERRITIN 196   Sepsis Labs: Recent Labs  Lab 09/13/21 1122 09/13/21 1132 09/13/21 1550  PROCALCITON <0.10  --   --   LATICACIDVEN  --  1.2 1.0    Recent Results (from the past 240 hour(s))  Resp Panel by RT-PCR (Flu A&B, Covid) Nasopharyngeal Swab     Status: Abnormal   Collection Time: 09/13/21 12:46 PM   Specimen: Nasopharyngeal Swab; Nasopharyngeal(NP) swabs in vial transport medium  Result Value Ref Range Status   SARS Coronavirus 2 by RT PCR POSITIVE (A) NEGATIVE Final    Comment: (NOTE) SARS-CoV-2 target nucleic acids are DETECTED.  The SARS-CoV-2 RNA is generally detectable in upper respiratory specimens during the acute phase of infection. Positive results are indicative of the presence of the identified virus, but do not rule out bacterial infection or co-infection with other pathogens not detected by the test. Clinical correlation with patient history and other diagnostic information is necessary to determine patient infection status. The expected result is Negative.  Fact Sheet for  Patients: EntrepreneurPulse.com.au  Fact Sheet for Healthcare Providers: IncredibleEmployment.be  This test is not yet approved or cleared by the [place] FDA and  has been authorized for detection and/or diagnosis of SARS-CoV-2 by FDA under an Emergency Use Authorization (EUA).  This EUA will remain in effect (meaning this test can be used) for the duration of  the COVID-19 declaration under Section 564(b)(1) of the A ct, 21 U.S.C. section 360bbb-3(b)(1), unless the authorization is terminated or revoked sooner.     Influenza A by PCR NEGATIVE NEGATIVE Final   Influenza B by PCR NEGATIVE NEGATIVE Final    Comment: (NOTE) The Xpert Xpress SARS-CoV-2/FLU/RSV plus assay is intended as an aid in the diagnosis of influenza from Nasopharyngeal swab specimens and should not be used as a sole basis for treatment. Nasal washings and aspirates are unacceptable for Xpert Xpress SARS-CoV-2/FLU/RSV testing.  Fact Sheet for Patients: EntrepreneurPulse.com.au  Fact Sheet for Healthcare  Providers: IncredibleEmployment.be  This test is not yet approved or cleared by the [place] and has been authorized for detection and/or diagnosis of SARS-CoV-2 by FDA under an Emergency Use Authorization (EUA). This EUA will remain in effect (meaning this test can be used) for the duration of the COVID-19 declaration under Section 564(b)(1) of the Act, 21 U.S.C. section 360bbb-3(b)(1), unless the authorization is terminated or revoked.  Performed at [facility], [address], [place], [place] [address]          Radiology Studies: DG Chest 2 View  Result Date: 09/13/2021 CLINICAL DATA:  76 year old male with weakness, falls. EXAM: CHEST - 2 VIEW COMPARISON:  09/03/2021 FINDINGS: The mediastinal contours are within normal limits. No cardiomegaly. Atherosclerotic calcification of the aortic arch.  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 Mild bibasilar streaky opacities. The lungs are otherwise clear bilaterally without evidence of focal consolidation, pleural effusion, or pneumothorax. No acute osseous abnormality. IMPRESSION: 1. Mild bibasilar subsegmental atelectasis. 2.  Aortic Atherosclerosis (ICD10-I70.0). Electronically Signed   By: Ruthann Cancer M.D.   On: 09/13/2021 12:44   CT Head Wo Contrast  Result Date: 09/13/2021 CLINICAL DATA:  Multiple falls, chest pain, history atrial fibrillation, COPD, CLL, coronary artery disease post MI, hypertension EXAM: CT HEAD WITHOUT CONTRAST TECHNIQUE: Contiguous axial images were obtained from the base of the skull through the vertex without intravenous contrast. RADIATION DOSE REDUCTION: This exam was performed according to the departmental dose-optimization program which includes automated exposure control, adjustment of the mA and/or kV according to patient size and/or use of iterative reconstruction technique. COMPARISON:  11/02/2015 FINDINGS: Brain: Generalized atrophy. Normal ventricular morphology. No midline shift or mass effect. Small vessel chronic ischemic changes of deep cerebral white matter. No intracranial hemorrhage, mass lesion, evidence of acute infarction, or extra-axial fluid collection. Vascular: No hyperdense vessels. Atherosclerotic calcifications of internal carotid arteries bilaterally at skull base. Skull: Intact Sinuses/Orbits: Diffuse mucosal thickening throughout paranasal sinuses with suspected mucosal retention cysts within sphenoid and maxillary sinuses Other: N/A IMPRESSION: Generalized atrophy with mild small vessel chronic ischemic changes of deep cerebral white matter. No acute intracranial abnormalities. Chronic sinus disease changes. Electronically Signed   By: Lavonia Dana M.D.   On: 09/13/2021 14:56   CT Angio Chest Pulmonary Embolism (PE) W or WO  Contrast  Result Date: 09/13/2021 CLINICAL DATA:  Chest pain, history of multiple falls, question pulmonary embolism EXAM: CT ANGIOGRAPHY CHEST WITH CONTRAST TECHNIQUE: Multidetector CT imaging of the chest was performed using the standard protocol during bolus administration of intravenous contrast. Multiplanar CT image reconstructions and MIPs were obtained to evaluate the vascular anatomy. RADIATION DOSE REDUCTION: This exam was performed according to the departmental dose-optimization program which includes automated exposure control, adjustment of the mA and/or kV according to patient size and/or use of iterative reconstruction technique. CONTRAST:  98m OMNIPAQUE IOHEXOL 350 MG/ML SOLN COMPARISON:  CT chest 05/22/2021 FINDINGS: Cardiovascular: Atherosclerotic calcifications aorta, proximal great vessels, coronary arteries. Aorta normal caliber. No aneurysm or dissection. Mild enlargement of cardiac chambers. No pericardial effusion. 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 Respiratory motion artifacts in the mid to lower lungs. Pulmonary arteries patent. No evidence of pulmonary embolism. Mediastinum/Nodes: Esophagus unremarkable. Base of cervical region normal appearance. Multiple normal and upper normal sized axillary lymph nodes bilaterally. Lungs/Pleura: Dependent atelectasis in the BILATERAL lower lobes. Subsegmental atelectasis in lingula. No acute infiltrate, pleural effusion, or pneumothorax. Upper Abdomen: Gallbladder surgically absent.  Visualized upper abdomen unremarkable Musculoskeletal: No acute osseous findings. Review of the MIP images confirms the above findings. IMPRESSION: No evidence of pulmonary embolism. Scattered atherosclerotic calcifications including coronary arteries. Bibasilar atelectasis. Aortic Atherosclerosis (ICD10-I70.0). Electronically Signed   By: Lavonia Dana M.D.   On: 09/13/2021 15:07        Scheduled Meds:  acyclovir  400 mg Oral BID   amiodarone  100 mg  Oral 2 times per day on Mon Tue Thu Fri Sat   apixaban  5 mg Oral BID   atorvastatin  80 mg Oral QHS   azelastine  2 spray Each Nare BID   azithromycin  250 mg Oral Daily   ezetimibe  10 mg Oral Daily   ipratropium  2 puff Inhalation Q4H   isosorbide mononitrate  30 mg Oral Daily   methylPREDNISolone (SOLU-MEDROL) injection  80 mg Intravenous Q24H   mometasone-formoterol  2 puff Inhalation BID   multivitamin with minerals  1 tablet Oral Daily   pantoprazole  40 mg Oral Daily   venetoclax  100 mg Oral Q breakfast   Continuous Infusions:   LOS: 1 day    Time spent: 35 mins     Wyvonnia Dusky, MD [facility] Pager [phone]  If 7PM-7AM, please contact night-coverage 09/14/2021, 8:03 AM

## 2021-09-14 NOTE — Progress Notes (Signed)
On2/24- I spoke to patients wife regarding her concerns for husbands, worsening breathing/delirium. Patient is currently in the hospital.

## 2021-09-14 NOTE — Evaluation (Signed)
Physical Therapy Evaluation Patient Details Name: [name] MRN: [id number] DOB: 1945/12/10 Today's Date: 09/14/2021  History of Present Illness  Pt admitted to [facility] on 09/13/21 for c/o weakness, SOB, cough, and multiple falls. COVID positive last wednesday with mild hypoxia on RA, improved with supplemntal O2. 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 Imaging unremarkable.   Clinical Impression  Pt is a 76 year old M admitted to hospital on 09/13/21 for acute respiratory disease secondary to [place]. At baseline, pt was mod I with ADL's, medication management, and ambulation with AD; spouse to assist with IADL's. Pt presents with generalized weakness, decreased activity tolerance, increased O2 dependence from baseline, decreased gross balance, impaired cardiopulmonary tolerance to activity, anxiety with mobility, and self limiting behaviors, resulting in impaired functional mobility from baseline. Due to deficits, pt required min-mod assist for bed mobility, mod assist for transfers, and min assist for limited gait at bedside with RW. Increased time/effort required during session for rest breaks with cues for pursed lip breathing, due to labored breathing and tachypnea. Deficits limit the pt's ability to safely and independently perform ADL's, transfer, and ambulate. Pt will benefit from acute skilled PT services to address deficits for return to baseline function. At this time, PT recommends SNF at [place] to address deficits and improve overall safety with functional mobility prior to return home. Pt agreeable.        Recommendations for follow up therapy are one component of a multi-disciplinary discharge planning process, led by the attending physician.  Recommendations may be updated based on patient status, additional functional  criteria and insurance authorization.  Follow Up Recommendations Skilled nursing-short term rehab (<3 hours/day)    Assistance Recommended at Discharge Intermittent Supervision/Assistance  Patient can return home with the following  Two people to help with walking and/or transfers;A lot of help with bathing/dressing/bathroom;Assistance with cooking/housework;Help with stairs or ramp for entrance    Equipment Recommendations  (defer to post acute)     Functional Status Assessment Patient has had a recent decline in their functional status and demonstrates the ability to make significant improvements in function in a reasonable and predictable amount of time.     Precautions / Restrictions Precautions Precautions: Fall Restrictions Weight Bearing Restrictions: No      Mobility  Bed Mobility Overal bed mobility: Needs Assistance Bed Mobility: Supine to Sit, Sit to Supine     Supine to sit: HOB elevated, Mod assist Sit to supine: HOB elevated, Min assist   General bed mobility comments: Increased assist for trunk and BLE facilitation, HOB elevated, increased time/effort, cues for sequencing    Transfers Overall transfer level: Needs assistance Equipment used: Rolling walker (2 wheels) Transfers: Sit to/from Stand Sit to Stand: Mod assist, From elevated surface           General transfer comment: for power to stand from EOB and controlled descent to sit. Increased time/effort to achieve full upright standing. Demonstrates poor eccentric control with stand>sit    Ambulation/Gait Ambulation/Gait assistance: Min assist Gait Distance (Feet): 2 Feet (4 lateral steps towards HOB) Assistive device: Rolling walker (2 wheels)         General Gait Details: Min assist for balance and RW negotiation. Able to take a few steps towards HOB, demonstrating decreased step length/foot clearance bil, labored breathing, forward flexed posture, and wide BOS. SpO2 >90% throughout, RR  elevated to 33 breaths/min     Balance Overall balance assessment: Needs assistance Sitting-balance support: Bilateral upper extremity supported, Feet supported Sitting balance-Leahy Scale: Fair Sitting balance - Comments: intermittent "shaking" while seated EOB   Standing balance support: During functional activity, Bilateral upper extremity supported Standing balance-Leahy Scale: Poor Standing balance comment: min assist for balance in RW                             Pertinent Vitals/Pain Pain Assessment Pain Assessment: 0-10 Pain Score: 4  Pain Location: chest pain; elevates to 9/10 when coughing Pain Descriptors / Indicators: Sharp, Aching, Throbbing, Dull Pain Intervention(s): Limited activity within patient's tolerance, Monitored during session, Premedicated before session, Repositioned    Home Living Family/patient expects to be discharged to:: Private residence Living Arrangements: Spouse/significant other (and granddaughter) Available Help at Discharge: Family;Available 24 hours/day Type of Home: House Home Access: Stairs to enter;Ramped entrance Entrance Stairs-Rails: Can reach both Entrance Stairs-Number of Steps: 7 STE   Home Layout: One level Home Equipment: Rollator (4 wheels);Cane - single point;BSC/3in1;Shower seat;Electric scooter Additional Comments: believes he has a 3in1 and shower seat at home    Prior Function Prior Level of Function : Independent/Modified Independent;Driving;History of Falls (last six months)             Mobility Comments: 4ww for household ambulation and SPC for limited community ambulation. Has an electric scooter for ramp accessibility. Several falls in the last 38mo ADLs Comments: Assist for IADL's; Ind with ADL's, yard work, medication management, and driving     Hand Dominance   Dominant Hand: Right    Extremity/Trunk Assessment   Upper Extremity Assessment Upper Extremity Assessment: Generalized weakness     Lower Extremity Assessment Lower Extremity Assessment: Generalized weakness    Cervical / Trunk Assessment Cervical / Trunk Assessment: Normal  Communication   Communication: No difficulties  Cognition Arousal/Alertness: Awake/alert Behavior During Therapy: WFL for tasks assessed/performed Overall Cognitive Status: Within Functional Limits for tasks assessed                                 General Comments: A&O x4; labored breathing; able to follow 100% of simple 2-step commands        General Comments General comments (skin integrity, edema, etc.): labored breathing, increased RR with mobility possibly due to anxiety, self limiting behavior; SpO2 >90% throughout session on 2L with RR elevated to 33 breaths/min. Multimodal cues for pursed lip breathing throughout session.    Exercises Other Exercises Other Exercises: Bed mobility, transfers, and minimal gait. Increased time/effort for mobility, increased rest break with cues for pursed lip breathing. Other Exercises: Educated re: PT role/POC, DC recommendations, activity pacing, pursed lip breathing, benefits of prone time/HOB elevated.   Assessment/Plan    PT Assessment Patient needs continued PT services  PT Problem List Decreased strength;Decreased activity tolerance;Decreased balance;Decreased mobility;Cardiopulmonary status limiting activity       PT Treatment Interventions DME instruction;Gait training;Functional mobility training;Therapeutic activities;Balance training;Therapeutic exercise;Neuromuscular re-education;Patient/family education    PT Goals (Current goals can be found in the Care Plan section)  Acute Rehab PT Goals Patient Stated Goal: "get better" PT Goal Formulation: With patient Time For Goal Achievement: 09/28/21 Potential to Achieve Goals: Good    Frequency Min 2X/week        AM-PAC PT "6 Clicks" Mobility  Outcome Measure Help needed turning from your  back to your side while in  a flat bed without using bedrails?: A Little Help needed moving from lying on your back to sitting on the side of a flat bed without using bedrails?: A Lot Help needed moving to and from a bed to a chair (including a wheelchair)?: A Little Help needed standing up from a chair using your arms (e.g., wheelchair or bedside chair)?: A Lot Help needed to walk in hospital room?: A Little Help needed climbing 3-5 steps with a railing? : A Lot 6 Click Score: 15    End of Session Equipment Utilized During Treatment: Gait belt;Oxygen (2L) Activity Tolerance: Patient limited by fatigue Patient left: in bed;with call bell/phone within reach;with bed alarm set Nurse Communication: Mobility status PT Visit Diagnosis: Unsteadiness on feet (R26.81);Muscle weakness (generalized) (M62.81);History of falling (Z91.81);Difficulty in walking, not elsewhere classified (R26.2)    Time: 1131-1200 PT Time Calculation (min) (ACUTE ONLY): 29 min   Charges:   PT Evaluation $PT Eval Low Complexity: 1 Low PT Treatments $Therapeutic Activity: 8-22 mins       Herminio Commons, PT, DPT 1:37 PM,09/14/21

## 2021-09-14 NOTE — Evaluation (Signed)
Occupational Therapy Evaluation Patient Details Name: [name] MRN: [id number] DOB: [birth year] Today's Date: 09/14/2021   History of Present Illness Pt admitted to [facility] on 09/13/21 for c/o weakness, SOB, cough, and multiple falls. COVID positive last wednesday with mild hypoxia on RA, improved with supplemntal O2. 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 Imaging unremarkable.   Clinical Impression   Pt seen for OT evaluation this date in setting of acute hospitalization d/t SOB and falls. Pt presents this date with decreased activity tolerance, generalized deconditioning, and SOB impacting his abiltiy to safely and efficiently perform ADLs/ADL mobility. Pt reports being INDEP at baseline, but endorses several falls since leaving [facility] last week (at least 4). He does not feel he can care for himself nor can his spouse care for him and she is also currently ill. Pt currently requires: SETUP to MIN A for UB ADLs including UB dressing and bathing. OT engages pt in LB bathing bed level as he is too fatigued to CTS for LB bathing/peri care. Attempted to CTS x2 trials from elevated EOB sitting, but pt c/o being too fatigued from PT session earlier. Pt returned to bed with all needs met and in reach. Requires MAX A for repositioning and becomes very anxious with the bed flat for even very short bouts. Requires calming strategies and relaxation techniques. Pt sat in chair position for meal time. At this time d/t severely low activity tolerance and several falls at home, recommending pt f/u with STR OT services to ensure safety with self care.      Recommendations for follow up therapy are one component of a multi-disciplinary discharge planning process, led by the attending physician.  Recommendations may be updated based on patient status,  additional functional criteria and insurance authorization.   Follow Up Recommendations  Skilled nursing-short term rehab (<3 hours/day)    Assistance Recommended at Discharge Frequent or constant Supervision/Assistance  Patient can return home with the following A lot of help with walking and/or transfers;A lot of help with bathing/dressing/bathroom    Functional Status Assessment  Patient has had a recent decline in their functional status and demonstrates the ability to make significant improvements in function in a reasonable and predictable amount of time.  Equipment Recommendations  Other (comment) (defer to next level of care)    Recommendations for Other Services       Precautions / Restrictions Precautions Precautions: Fall Restrictions Weight Bearing Restrictions: No      Mobility Bed Mobility Overal bed mobility: Needs Assistance Bed Mobility: Supine to Sit, Sit to Supine     Supine to sit: HOB elevated, Mod assist Sit to supine: HOB elevated, Min assist   General bed mobility comments: MOD A for trunk assist to come to EOB sitting    Transfers                   General transfer comment: attempted CTS on OT evluation with RW and pt is about 1/2 way able to clear his bottom from bed with MAX A from OT, but reports being too fatigued and essentially does not control descent back to sitting. C/o being tired from PT session      Balance Overall balance assessment: Needs assistance Sitting-balance support: Bilateral upper extremity supported, Feet supported Sitting balance-Leahy Scale: Fair  ADL either performed or assessed with clinical judgement   ADL Overall ADL's : Needs assistance/impaired Eating/Feeding: Set up   Grooming: Set up   Upper Body Bathing: Minimal assistance;Sitting   Lower Body Bathing: Maximal assistance;Bed level Lower Body Bathing Details (indicate cue type and reason):  unabel to CTS, c/o being fatigued from PT session earlier today to stand for LB bathing, it is therefore completed bed level Upper Body Dressing : Minimal assistance;Sitting Upper Body Dressing Details (indicate cue type and reason): faitigued with removal of t-shirt Lower Body Dressing: Maximal assistance;Bed level Lower Body Dressing Details (indicate cue type and reason): socks                     Vision Patient Visual Report: No change from baseline       Perception     Praxis      Pertinent Vitals/Pain Pain Assessment Pain Assessment: 0-10 Pain Score: 4  Pain Location: chest pain; elevates to 9/10 when coughing Pain Descriptors / Indicators: Sharp, Aching, Throbbing, Dull Pain Intervention(s): Limited activity within patient's tolerance, Monitored during session, Premedicated before session, Repositioned     Hand Dominance Right   Extremity/Trunk Assessment Upper Extremity Assessment Upper Extremity Assessment: Generalized weakness   Lower Extremity Assessment Lower Extremity Assessment: Generalized weakness   Cervical / Trunk Assessment Cervical / Trunk Assessment: Normal   Communication Communication Communication: No difficulties   Cognition Arousal/Alertness: Awake/alert Behavior During Therapy: WFL for tasks assessed/performed Overall Cognitive Status: Within Functional Limits for tasks assessed                                 General Comments: A&O x4; labored breathing; able to follow 100% of simple 2-step commands, somewhat anxious, esp with in bed repositioning     General Comments  labored breathing, increased RR with mobility possibly due to anxiety, self limiting behavior; SpO2 >90% throughout session on 2L with RR elevated to 33 breaths/min. Multimodal cues for pursed lip breathing throughout session.    Exercises Other Exercises Other Exercises: OT ed re: role, importance of OOB Activity, deep breathing/coughing to clear  secretions and prevent PNA.   Shoulder Instructions      Home Living Family/patient expects to be discharged to:: Private residence Living Arrangements: Spouse/significant other (and granddaughter) Available Help at Discharge: Family;Available 24 hours/day Type of Home: House Home Access: Stairs to enter;Ramped entrance Entrance Stairs-Number of Steps: 7 STE Entrance Stairs-Rails: Can reach both Home Layout: One level     Bathroom Shower/Tub: Walk-in Hydrologist: Handicapped height Bathroom Accessibility: Yes   Home Equipment: Rollator (4 wheels);Cane - single point;BSC/3in1;Shower seat;Electric scooter   Additional Comments: believes he has a 3in1 and shower seat at home      Prior Functioning/Environment Prior Level of Function : Independent/Modified Independent;Driving;History of Falls (last six months)             Mobility Comments: 4ww for household ambulation and SPC for limited community ambulation. Has an electric scooter for ramp accessibility. Several falls in the last 63moADLs Comments: Assist for IADL's; Ind with ADL's, yard work, medication management, and driving        OT Problem List: Decreased strength;Decreased activity tolerance;Decreased knowledge of use of DME or AE;Cardiopulmonary status limiting activity;Impaired balance (sitting and/or standing)      OT Treatment/Interventions: Self-care/ADL training;Therapeutic exercise;Therapeutic activities;Energy conservation;DME and/or AE instruction    OT Goals(Current goals can  be found in the care plan section) Acute Rehab OT Goals Patient Stated Goal: to get stronger and go home OT Goal Formulation: With patient Time For Goal Achievement: 09/28/21 Potential to Achieve Goals: Good  OT Frequency: Min 2X/week    Co-evaluation              AM-PAC OT "6 Clicks" Daily Activity     Outcome Measure Help from another person eating meals?: None Help from another person taking  care of personal grooming?: A Little Help from another person toileting, which includes using toliet, bedpan, or urinal?: A Lot Help from another person bathing (including washing, rinsing, drying)?: A Lot Help from another person to put on and taking off regular upper body clothing?: A Little Help from another person to put on and taking off regular lower body clothing?: A Lot 6 Click Score: 16   End of Session Equipment Utilized During Treatment: Rolling walker (2 wheels);Oxygen Nurse Communication: Mobility status  Activity Tolerance: Patient tolerated treatment well Patient left: in bed;with call bell/phone within reach;with bed alarm set  OT Visit Diagnosis: Unsteadiness on feet (R26.81);Muscle weakness (generalized) (M62.81)                Time: 7972-8206 OT Time Calculation (min): 29 min Charges:  OT General Charges $OT Visit: 1 Visit OT Evaluation $OT Eval Moderate Complexity: 1 Mod OT Treatments $Self Care/Home Management : 8-22 mins  Gerrianne Scale, MS, OTR/L ascom [phone] 09/14/21, 2:16 PM

## 2021-09-14 NOTE — TOC Progression Note (Signed)
Transition of Care [facility]) - Progression Note    Patient Details  Name: [name] MRN: [id number] Date of Birth: 1946-05-23  Transition of Care [facility]) CM/SW [place], [place] Phone Number: 09/14/2021, 4:50 PM  Clinical Narrative:   SNF search started. [facility]     Expected Discharge Plan: [place] Barriers to Discharge: Continued Medical Work up  Expected Discharge Plan and Services Expected Discharge Plan: [place]                                               Social Determinants of Health (SDOH) Interventions    Readmission Risk Interventions No flowsheet data found.

## 2021-09-15 ENCOUNTER — Other Ambulatory Visit: Payer: Self-pay

## 2021-09-15 DIAGNOSIS — U071 COVID-19: Secondary | ICD-10-CM | POA: Diagnosis not present

## 2021-09-15 DIAGNOSIS — C9110 Chronic lymphocytic leukemia of B-cell type not having achieved remission: Secondary | ICD-10-CM | POA: Diagnosis not present

## 2021-09-15 LAB — COMPREHENSIVE METABOLIC PANEL
ALT: 34 U/L (ref 0–44)
AST: 29 U/L (ref 15–41)
Albumin: 3.1 g/dL — ABNORMAL LOW (ref 3.5–5.0)
Alkaline Phosphatase: 59 U/L (ref 38–126)
Anion gap: 8 (ref 5–15)
BUN: 30 mg/dL — ABNORMAL HIGH (ref 8–23)
CO2: 25 mmol/L (ref 22–32)
Calcium: 8 mg/dL — ABNORMAL LOW (ref 8.9–10.3)
Chloride: 100 mmol/L (ref 98–111)
Creatinine, Ser: 0.89 mg/dL (ref 0.61–1.24)
GFR, Estimated: >60 mL/min (ref >60)
Glucose, Bld: 223 mg/dL — ABNORMAL HIGH (ref 70–99)
Potassium: 4.2 mmol/L (ref 3.5–5.1)
Sodium: 133 mmol/L — ABNORMAL LOW (ref 135–145)
Total Bilirubin: 1.2 mg/dL (ref 0.3–1.2)
Total Protein: 5.7 g/dL — ABNORMAL LOW (ref 6.5–8.1)

## 2021-09-15 NOTE — TOC Progression Note (Signed)
Transition of Care [facility]) - Progression Note    Patient Details  Name: [name] MRN: [id number] Date of Birth: 05-31-1946  Transition of Care [facility]) CM/SW [place], [place] Phone Number: [phone] 09/15/2021, 9:40 AM  Clinical Narrative:     Plan for d/c is SNF, no current bed offers.  [facility]. Main contact Rice, Walsh (Spouse) [phone] [facility]).  Expected Discharge Plan: Skilled Nursing Facility Barriers to Discharge: Continued Medical Work up  Expected Discharge Plan and Services Expected Discharge Plan: [place]                                               Social Determinants of Health (SDOH) Interventions    Readmission Risk Interventions No flowsheet data found.

## 2021-09-15 NOTE — Progress Notes (Signed)
PROGRESS NOTE    [name]  EXB:[id number] DOB: 01/01/1946 DOA: 09/13/2021 PCP: Leone Haven, MD   Assessment & Plan:   Principal Problem:   Acute respiratory disease due to COVID-19 virus Active Problems:   Hyperlipidemia   Essential hypertension   Paroxysmal atrial fibrillation (HCC)   COPD (chronic obstructive pulmonary disease) (HCC)   CLL (chronic lymphocytic leukemia) (HCC)   Chronic diastolic CHF (congestive heart failure) (HCC)   OSA on CPAP   Thrombocytopenia (HCC)   CAD (coronary artery disease)   Elevated troponin   Fall at home, initial encounter   Sepsis ([place])   AKI (acute kidney injury) ([place])   Sepsis: met criteria w/ tachycardia, tachypnea & secondary to COVID-19 virus. CT angiogram is negative for PE.  No obvious infiltration on chest x-ray.  Blood cxs NGTD. Sepsis resolved   Acute hypoxic respiratory failure: had oxygen desaturation to 88% on RA. Likely secondary to [place]. Weaned off of supplemental oxygen   COVID19: initially tested positive on 09/01/21 at home, possibly long hauler. Continue on IV steroids, bronchodilators & encourage incentive spirometry. Out of window for remdesivir, so it was d/c on admission. Continue on airborne & contact precautions   HLD: continue on zetia, statin    HTN: continue on imdur    PAF: continue on eliquis, amiodarone    COPD exacerbation: continue on steroids, azithromycin & bronchodilators. Encourage flutter valve & incentive spirometry    Chronic diastolic NUU:VOZD on 12/24/4401 showed EF of 55 to 60% with grade 1 diastolic dysfunction. CHF seem to be compensated. Monitor I/Os  Anxiety: severity unknown. Xanax prn    OSA: continue on CPAP    Thrombocytopenia: chronic, possibly secondary to CLL   Elevated troponin: likely secondary to demand ischemia. Hx of CAD. Continue on statin, imdur & nitro prn    Fall: at home. PT/OT recs SNF    AKI: resolved    CLL: management per onco outpatient     Obesity: BMI 32.4. Complicates overall care & prognosis   DVT prophylaxis: eliquis  Code Status: DNR Family Communication: discussed pt's care w/ pt's wife, Sheria Lang, and answered her questions  Disposition Plan: likely d/c to SNF   Level of care: Telemetry Medical  Status is: Inpatient Remains inpatient appropriate because: severity of illness, still SOB and requiring IV steroids     Consultants:    Procedures:   Antimicrobials: azithromycin    Subjective: Pt c/o difficulty breathing still, slightly improved from day prior.   Objective: Vitals:   09/14/21 1219 09/14/21 1705 09/14/21 2138 09/15/21 0352  BP: 109/70 115/73 110/60 129/75  Pulse: 70 65 61 60  Resp: $Remo'16 18 18 18  'yNCbV$ Temp: (!) 97.5 F (36.4 C) 98.2 F (36.8 C) 98 F (36.7 C) (!) 97 F (36.1 C)  TempSrc: Oral     SpO2: 95% 99% 96% 95%  Weight:      Height:        Intake/Output Summary (Last 24 hours) at 09/15/2021 0751 Last data filed at 09/15/2021 0000 Gross per 24 hour  Intake 420 ml  Output 680 ml  Net -260 ml   Filed Weights   09/13/21 1124  Weight: 99.7 kg    Examination:  General exam: Appears calm & comfortable  Respiratory system: decreased breath sounds b/l  Cardiovascular system: S1/S2+. No clicks or rubs  Gastrointestinal system: Abd is soft, NT, obese & hypoactive bowel sounds Central nervous system: alert and oriented. Moves all extremities   Psychiatry: Judgement and  insight appear at baseline. Flat mood and affect     Data Reviewed: I have personally reviewed following labs and imaging studies  CBC: Recent Labs  Lab 09/13/21 1122 09/14/21 0505  WBC 10.9* 7.2  NEUTROABS  --  5.2  HGB 13.2 12.8*  HCT 40.4 39.2  MCV 103.9* 102.1*  PLT 113* 810*   Basic Metabolic Panel: Recent Labs  Lab 09/13/21 1122 09/14/21 0505 09/15/21 0602  NA 136 136 133*  K 3.7 4.1 4.2  CL 97* 100 100  CO2 $Re'27 25 25  'xUU$ GLUCOSE 130* 182* 223*  BUN 24* 24* 30*  CREATININE 1.25* 0.88 0.89   CALCIUM 8.3* 8.0* 8.0*   GFR: Estimated Creatinine Clearance: 82.2 mL/min (by C-G formula based on SCr of 0.89 mg/dL). Liver Function Tests: Recent Labs  Lab 09/13/21 1122 09/14/21 0505 09/15/21 0602  AST $Re'29 27 29  'vBt$ ALT 27 30 34  ALKPHOS 73 68 59  BILITOT 1.8* 1.6* 1.2  PROT 6.9 6.2* 5.7*  ALBUMIN 3.8 3.4* 3.1*   No results for input(s): LIPASE, AMYLASE in the last 168 hours. No results for input(s): AMMONIA in the last 168 hours. Coagulation Profile: No results for input(s): INR, PROTIME in the last 168 hours. Cardiac Enzymes: No results for input(s): CKTOTAL, CKMB, CKMBINDEX, TROPONINI in the last 168 hours. BNP (last 3 results) No results for input(s): PROBNP in the last 8760 hours. HbA1C: No results for input(s): HGBA1C in the last 72 hours. CBG: No results for input(s): GLUCAP in the last 168 hours. Lipid Profile: No results for input(s): CHOL, HDL, LDLCALC, TRIG, CHOLHDL, LDLDIRECT in the last 72 hours. Thyroid Function Tests: No results for input(s): TSH, T4TOTAL, FREET4, T3FREE, THYROIDAB in the last 72 hours. Anemia Panel: Recent Labs    09/13/21 1550  FERRITIN 196   Sepsis Labs: Recent Labs  Lab 09/13/21 1122 09/13/21 1132 09/13/21 1550  PROCALCITON <0.10  --   --   LATICACIDVEN  --  1.2 1.0    Recent Results (from the past 240 hour(s))  Resp Panel by RT-PCR (Flu A&B, Covid) Nasopharyngeal Swab     Status: Abnormal   Collection Time: 09/13/21 12:46 PM   Specimen: Nasopharyngeal Swab; Nasopharyngeal(NP) swabs in vial transport medium  Result Value Ref Range Status   SARS Coronavirus 2 by RT PCR POSITIVE (A) NEGATIVE Final    Comment: (NOTE) SARS-CoV-2 target nucleic acids are DETECTED.  The SARS-CoV-2 RNA is generally detectable in upper respiratory specimens during the acute phase of infection. Positive results are indicative of the presence of the identified virus, but do not rule out bacterial infection or co-infection with other pathogens  not detected by the test. Clinical correlation with patient history and other diagnostic information is necessary to determine patient infection status. The expected result is Negative.  Fact Sheet for Patients: EntrepreneurPulse.com.au  Fact Sheet for Healthcare Providers: IncredibleEmployment.be  This test is not yet approved or cleared by the [place] FDA and  has been authorized for detection and/or diagnosis of SARS-CoV-2 by FDA under an Emergency Use Authorization (EUA).  This EUA will remain in effect (meaning this test can be used) for the duration of  the COVID-19 declaration under Section 564(b)(1) of the A ct, 21 U.S.C. section 360bbb-3(b)(1), unless the authorization is terminated or revoked sooner.     Influenza A by PCR NEGATIVE NEGATIVE Final   Influenza B by PCR NEGATIVE NEGATIVE Final    Comment: (NOTE) The Xpert Xpress SARS-CoV-2/FLU/RSV plus assay is intended as an aid  in the diagnosis of influenza from Nasopharyngeal swab specimens and should not be used as a sole basis for treatment. Nasal washings and aspirates are unacceptable for Xpert Xpress SARS-CoV-2/FLU/RSV testing.  Fact Sheet for Patients: EntrepreneurPulse.com.au  Fact Sheet for Healthcare Providers: IncredibleEmployment.be  This test is not yet approved or cleared by the [place] FDA and has been authorized for detection and/or diagnosis of SARS-CoV-2 by FDA under an Emergency Use Authorization (EUA). This EUA will remain in effect (meaning this test can be used) for the duration of the COVID-19 declaration under Section 564(b)(1) of the Act, 21 U.S.C. section 360bbb-3(b)(1), unless the authorization is terminated or revoked.  Performed at [facility], [address] [place], [place] [address]   Culture, blood (x 2)     Status: None (Preliminary result)   Collection Time: 09/13/21  5:00 PM    Specimen: Right Antecubital; Blood  Result Value Ref Range Status   Specimen Description RIGHT ANTECUBITAL  Final   Special Requests   Final    BOTTLES DRAWN AEROBIC AND ANAEROBIC Blood Culture adequate volume   Culture   Final    NO GROWTH 2 DAYS Performed at [facility], [address] [place], [place] [address]    Report Status PENDING  Incomplete  Culture, blood (x 2)     Status: None (Preliminary result)   Collection Time: 09/13/21  5:05 PM   Specimen: BLOOD LEFT HAND  Result Value Ref Range Status   Specimen Description BLOOD LEFT HAND  Final   Special Requests   Final    BOTTLES DRAWN AEROBIC AND ANAEROBIC Blood Culture adequate volume   Culture   Final    NO GROWTH 2 DAYS Performed at [facility], [address], [place],  [address]    Report Status PENDING  Incomplete         Radiology Studies: DG Chest 2 View  Result Date: 09/13/2021 CLINICAL DATA:  76 year old male with weakness, falls. EXAM: CHEST - 2 VIEW COMPARISON:  09/03/2021 FINDINGS: The mediastinal contours are within normal limits. No cardiomegaly. Atherosclerotic calcification of the aortic arch. 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 Mild bibasilar streaky opacities. The lungs are otherwise clear bilaterally without evidence of focal consolidation, pleural effusion, or pneumothorax. No acute osseous abnormality. IMPRESSION: 1. Mild bibasilar subsegmental atelectasis. 2.  Aortic Atherosclerosis (ICD10-I70.0). Electronically Signed   By: Ruthann Cancer M.D.   On: 09/13/2021 12:44   CT Head Wo Contrast  Result Date: 09/13/2021 CLINICAL DATA:  Multiple falls, chest pain, history atrial fibrillation, COPD, CLL, coronary artery disease post MI, hypertension EXAM: CT HEAD WITHOUT CONTRAST TECHNIQUE: Contiguous axial images were obtained from the base of the skull through the vertex without intravenous contrast. RADIATION  DOSE REDUCTION: This exam was performed according to the departmental dose-optimization program which includes automated exposure control, adjustment of the mA and/or kV according to patient size and/or use of iterative reconstruction technique. COMPARISON:  11/02/2015 FINDINGS: Brain: Generalized atrophy. Normal ventricular morphology. No midline shift or mass effect. Small vessel chronic ischemic changes of deep cerebral white matter. No intracranial hemorrhage, mass lesion, evidence of acute infarction, or extra-axial fluid collection. Vascular: No hyperdense vessels. Atherosclerotic calcifications of internal carotid arteries bilaterally at skull base. Skull: Intact Sinuses/Orbits: Diffuse mucosal thickening throughout paranasal sinuses with suspected mucosal retention cysts within sphenoid and maxillary sinuses Other: N/A IMPRESSION: Generalized atrophy with mild small vessel chronic ischemic changes of deep cerebral  white matter. No acute intracranial abnormalities. Chronic sinus disease changes. Electronically Signed   By: Lavonia Dana M.D.   On: 09/13/2021 14:56   CT Angio Chest Pulmonary Embolism (PE) W or WO Contrast  Result Date: 09/13/2021 CLINICAL DATA:  Chest pain, history of multiple falls, question pulmonary embolism EXAM: CT ANGIOGRAPHY CHEST WITH CONTRAST TECHNIQUE: Multidetector CT imaging of the chest was performed using the standard protocol during bolus administration of intravenous contrast. Multiplanar CT image reconstructions and MIPs were obtained to evaluate the vascular anatomy. RADIATION DOSE REDUCTION: This exam was performed according to the departmental dose-optimization program which includes automated exposure control, adjustment of the mA and/or kV according to patient size and/or use of iterative reconstruction technique. CONTRAST:  21mL OMNIPAQUE IOHEXOL 350 MG/ML SOLN COMPARISON:  CT chest 05/22/2021 FINDINGS: Cardiovascular: Atherosclerotic calcifications aorta, proximal  great vessels, coronary arteries. Aorta normal caliber. No aneurysm or dissection. Mild enlargement of cardiac chambers. No pericardial effusion. 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 Respiratory motion artifacts in the mid to lower lungs. Pulmonary arteries patent. No evidence of pulmonary embolism. Mediastinum/Nodes: Esophagus unremarkable. Base of cervical region normal appearance. Multiple normal and upper normal sized axillary lymph nodes bilaterally. Lungs/Pleura: Dependent atelectasis in the BILATERAL lower lobes. Subsegmental atelectasis in lingula. No acute infiltrate, pleural effusion, or pneumothorax. Upper Abdomen: Gallbladder surgically absent. Visualized upper abdomen unremarkable Musculoskeletal: No acute osseous findings. Review of the MIP images confirms the above findings. IMPRESSION: No evidence of pulmonary embolism. Scattered atherosclerotic calcifications including coronary arteries. Bibasilar atelectasis. Aortic Atherosclerosis (ICD10-I70.0). Electronically Signed   By: Lavonia Dana M.D.   On: 09/13/2021 15:07        Scheduled Meds:  acyclovir  400 mg Oral BID   amiodarone  100 mg Oral 2 times per day on Mon Tue Thu Fri Sat   apixaban  5 mg Oral BID   atorvastatin  80 mg Oral QHS   azelastine  2 spray Each Nare BID   azithromycin  250 mg Oral Daily   ezetimibe  10 mg Oral Daily   ipratropium  2 puff Inhalation Q4H   isosorbide mononitrate  30 mg Oral Daily   methylPREDNISolone (SOLU-MEDROL) injection  80 mg Intravenous Q24H   mometasone-formoterol  2 puff Inhalation BID   multivitamin with minerals  1 tablet Oral Daily   pantoprazole  40 mg Oral Daily   venetoclax  100 mg Oral Q breakfast   Continuous Infusions:   LOS: 2 days    Time spent: 25 mins     Wyvonnia Dusky, MD [facility] Pager [phone]  If 7PM-7AM, please contact night-coverage 09/15/2021, 7:51 AM

## 2021-09-15 NOTE — TOC Progression Note (Addendum)
Transition of Care [facility]) - Progression Note    Patient Details  Name: [name] MRN: [id number] Date of Birth: 1946-03-18  Transition [facility] Care [facility]) CM/SW [place], [place] Phone Number: [phone] 09/15/2021, 12:01 PM  Clinical Narrative:     CSW spoke with Tammy at [facility] and she confirmed bed offer for the patient.  Tammy stated she will run insurance auth and will update TOC when she receives a reply. CSW contacted patient's main contact Jaxtin, Raimondo (Spouse) [phone] and updated her with d/c plan.  Ms. Cullens verbalized understanding. Attending and Unit RN updated.   Expected Discharge Plan: Skilled Nursing Facility Barriers to Discharge: Continued Medical Work up  Expected Discharge Plan and Services Expected Discharge Plan: [place]                                               Social Determinants of Health (SDOH) Interventions    Readmission Risk Interventions No flowsheet data found.

## 2021-09-15 NOTE — Progress Notes (Signed)
° °      CROSS COVER NOTE  NAME: ISAIH BULGER MRN: [id number] DOB : 1946-01-01   Secure chat received from nursing reporting 10/10 chest pain that patient describes as feeling like an elephant is on his chest. Nitro x 3 given with no relief.  On my bedside assessment patient reported 10/10 mid chest pain that is reproducible and is worse when coughing and on inspiration. Morphine given with relief, pain 6/10. EKG obtained. Troponin tonight is flat at 7. Patient has had pleuritic chest pain this admission. Considering no relief of pain with nitro and relief with morphine+ EKG+flat troponin low suspicion that this pain is cardiac in etiology.    Neomia Glass MHA, MSN, FNP-BC Nurse Practitioner [facility] Pager [phone]

## 2021-09-16 DIAGNOSIS — U071 COVID-19: Secondary | ICD-10-CM | POA: Diagnosis not present

## 2021-09-16 DIAGNOSIS — C9110 Chronic lymphocytic leukemia of B-cell type not having achieved remission: Secondary | ICD-10-CM | POA: Diagnosis not present

## 2021-09-16 DIAGNOSIS — J432 Centrilobular emphysema: Secondary | ICD-10-CM | POA: Diagnosis not present

## 2021-09-16 LAB — CBC
HCT: 33.9 % — ABNORMAL LOW (ref 39.0–52.0)
Hemoglobin: 11.3 g/dL — ABNORMAL LOW (ref 13.0–17.0)
MCH: 33.7 pg (ref 26.0–34.0)
MCHC: 33.3 g/dL (ref 30.0–36.0)
MCV: 101.2 fL — ABNORMAL HIGH (ref 80.0–100.0)
Platelets: 115 10*3/uL — ABNORMAL LOW (ref 150–400)
RBC: 3.35 MIL/uL — ABNORMAL LOW (ref 4.22–5.81)
RDW: 14.5 % (ref 11.5–15.5)
WBC: 11.6 10*3/uL — ABNORMAL HIGH (ref 4.0–10.5)
nRBC: 0 % (ref 0.0–0.2)

## 2021-09-16 LAB — TROPONIN I (HIGH SENSITIVITY)
Troponin I (High Sensitivity): 7 ng/L (ref <18)
Troponin I (High Sensitivity): 7 ng/L (ref <18)

## 2021-09-16 MED ORDER — HYDROCOD POLI-CHLORPHE POLI ER 10-8 MG/5ML PO SUER: 5.0000 mL | Freq: Two times a day (BID) | ORAL | Status: DC | PRN

## 2021-09-16 NOTE — TOC Progression Note (Addendum)
Transition of Care [facility]) - Progression Note    Patient Details  Name: [name] MRN: [id number] Date of Birth: Jul 25, 1945  Transition [facility]) CM/SW [place], RN Phone Number: 09/16/2021, 9:16 AM  Clinical Narrative:   Reached out to Tammy at [facility] to inquire on Ins auth that is pending, awaiting a response, The patient was recently in the hospital with Covid on 2/15, he is currently still testing Pos as a residual, I notified Tammy that the 12 days has passed for [facility]    Expected Discharge Plan: Skilled Nursing Facility Barriers to Discharge: Continued Medical Work up  Expected Discharge Plan and Services Expected Discharge Plan: [place]                                               Social Determinants of Health (SDOH) Interventions    Readmission Risk Interventions No flowsheet data found.

## 2021-09-16 NOTE — Progress Notes (Signed)
PROGRESS NOTE    [name]  SAY:[id number] DOB: Dec 17, 1945 DOA: 09/13/2021 PCP: Leone Haven, MD   Assessment & Plan:   Principal Problem:   Acute respiratory disease due to COVID-19 virus Active Problems:   Hyperlipidemia   Essential hypertension   Paroxysmal atrial fibrillation (HCC)   COPD (chronic obstructive pulmonary disease) (HCC)   CLL (chronic lymphocytic leukemia) (HCC)   Chronic diastolic CHF (congestive heart failure) (HCC)   OSA on CPAP   Thrombocytopenia (HCC)   CAD (coronary artery disease)   Elevated troponin   Fall at home, initial encounter   Sepsis ([place])   AKI (acute kidney injury) ([place])   Sepsis: met criteria w/ tachycardia, tachypnea & secondary to COVID-19 virus. CT angiogram is negative for PE.  No obvious infiltration on chest x-ray.  Blood cxs NGTD. Sepsis resolved   Acute hypoxic respiratory failure: had oxygen desaturation to 88% on RA. Likely secondary to [place]. Placed back on oxygen today for comfort only   COVID19: initially tested positive on 09/01/21 at home, possibly long hauler. With significant cough today. Started on tussionex. Continue on IV steroids, bronchodilators & encourage flutter valve & incentive spirometry. Out of window for remdesivir, so it was d/c on admission.    HLD: continue on statin, zetia    HTN: continue on imdur    PAF: continue on amio, eliquis    COPD exacerbation: continue on azithromycin, steroids & bronchodilators. Encourage incentive spirometry and flutter valve    Chronic diastolic ATF:TDDU on 2/0/2542 showed EF of 55 to 60% with grade 1 diastolic dysfunction. CHF seem to be compensated. Monitor I/Os  Anxiety:  severity unknown. Xanax prn    OSA: continue on PCP    Thrombocytopenia: Chronic. Likely secondary to [facility]   Elevated troponin: likely secondary to demand ischemia. Hx of CAD. Continue on statin, imdur & nitro prn    Fall: at home. PT/OT recs SNF    AKI: resolved    CLL: management  per onco outpatient    Obesity: BMI 32.4. Complicates overall care & prognosis   DVT prophylaxis: eliquis  Code Status: DNR Family Communication: discussed pt's care w/ pt's wife, Sheria Lang, and answered her questions  Disposition Plan: likely d/c to SNF   Level of care: Med-Surg  Status is: Inpatient Remains inpatient appropriate because: severity of illness, still SOB and requiring IV steroids     Consultants:    Procedures:   Antimicrobials: azithromycin    Subjective: Pt c/o productive cough  Objective: Vitals:   09/15/21 2318 09/15/21 2326 09/15/21 2327 09/16/21 0522  BP: (!) 131/50 (!) 131/50 116/60 130/78  Pulse: 62 64 (!) 59 (!) 59  Resp:    18  Temp:      TempSrc:      SpO2: 96% 96% 94% 94%  Weight:      Height:        Intake/Output Summary (Last 24 hours) at 09/16/2021 0832 Last data filed at 09/16/2021 0532 Gross per 24 hour  Intake --  Output 1450 ml  Net -1450 ml   Filed Weights   09/13/21 1124  Weight: 99.7 kg    Examination:  General exam: Appears uncomfortable  Respiratory system: diminished breath sounds b/l Cardiovascular system: S1 & S2+. No rubs or gallops Gastrointestinal system: Abd is soft, NT, obese & hypoactive bowel sounds  Central nervous system: alert and oriented. Moves all extremities  Psychiatry: Judgement and insight appear at baseline. Flat mood and affect  Data Reviewed: I have personally reviewed following labs and imaging studies  CBC: Recent Labs  Lab 09/13/21 1122 09/14/21 0505 09/16/21 0232  WBC 10.9* 7.2 11.6*  NEUTROABS  --  5.2  --   HGB 13.2 12.8* 11.3*  HCT 40.4 39.2 33.9*  MCV 103.9* 102.1* 101.2*  PLT 113* 110* 956*   Basic Metabolic Panel: Recent Labs  Lab 09/13/21 1122 09/14/21 0505 09/15/21 0602 09/16/21 0232  NA 136 136 133* 135  K 3.7 4.1 4.2 4.4  CL 97* 100 100 101  CO2 $Re'27 25 25 25  'mrl$ GLUCOSE 130* 182* 223* 139*  BUN 24* 24* 30* 30*  CREATININE 1.25* 0.88 0.89 0.89  CALCIUM  8.3* 8.0* 8.0* 8.0*   GFR: Estimated Creatinine Clearance: 82.2 mL/min (by C-G formula based on SCr of 0.89 mg/dL). Liver Function Tests: Recent Labs  Lab 09/13/21 1122 09/14/21 0505 09/15/21 0602 09/16/21 0232  AST $Re'29 27 29 26  'Bpw$ ALT 27 30 34 35  ALKPHOS 73 68 59 63  BILITOT 1.8* 1.6* 1.2 1.5*  PROT 6.9 6.2* 5.7* 5.6*  ALBUMIN 3.8 3.4* 3.1* 3.0*   No results for input(s): LIPASE, AMYLASE in the last 168 hours. No results for input(s): AMMONIA in the last 168 hours. Coagulation Profile: No results for input(s): INR, PROTIME in the last 168 hours. Cardiac Enzymes: No results for input(s): CKTOTAL, CKMB, CKMBINDEX, TROPONINI in the last 168 hours. BNP (last 3 results) No results for input(s): PROBNP in the last 8760 hours. HbA1C: No results for input(s): HGBA1C in the last 72 hours. CBG: No results for input(s): GLUCAP in the last 168 hours. Lipid Profile: No results for input(s): CHOL, HDL, LDLCALC, TRIG, CHOLHDL, LDLDIRECT in the last 72 hours. Thyroid Function Tests: No results for input(s): TSH, T4TOTAL, FREET4, T3FREE, THYROIDAB in the last 72 hours. Anemia Panel: Recent Labs    09/13/21 1550  FERRITIN 196   Sepsis Labs: Recent Labs  Lab 09/13/21 1122 09/13/21 1132 09/13/21 1550  PROCALCITON <0.10  --   --   LATICACIDVEN  --  1.2 1.0    Recent Results (from the past 240 hour(s))  Resp Panel by RT-PCR (Flu A&B, Covid) Nasopharyngeal Swab     Status: Abnormal   Collection Time: 09/13/21 12:46 PM   Specimen: Nasopharyngeal Swab; Nasopharyngeal(NP) swabs in vial transport medium  Result Value Ref Range Status   SARS Coronavirus 2 by RT PCR POSITIVE (A) NEGATIVE Final    Comment: (NOTE) SARS-CoV-2 target nucleic acids are DETECTED.  The SARS-CoV-2 RNA is generally detectable in upper respiratory specimens during the acute phase of infection. Positive results are indicative of the presence of the identified virus, but do not rule out bacterial infection or  co-infection with other pathogens not detected by the test. Clinical correlation with patient history and other diagnostic information is necessary to determine patient infection status. The expected result is Negative.  Fact Sheet for Patients: EntrepreneurPulse.com.au  Fact Sheet for Healthcare Providers: IncredibleEmployment.be  This test is not yet approved or cleared by the [place] FDA and  has been authorized for detection and/or diagnosis of SARS-CoV-2 by FDA under an Emergency Use Authorization (EUA).  This EUA will remain in effect (meaning this test can be used) for the duration of  the COVID-19 declaration under Section 564(b)(1) of the A ct, 21 U.S.C. section 360bbb-3(b)(1), unless the authorization is terminated or revoked sooner.     Influenza A by PCR NEGATIVE NEGATIVE Final   Influenza B by PCR NEGATIVE NEGATIVE Final  Comment: (NOTE) The Xpert Xpress SARS-CoV-2/FLU/RSV plus assay is intended as an aid in the diagnosis of influenza from Nasopharyngeal swab specimens and should not be used as a sole basis for treatment. Nasal washings and aspirates are unacceptable for Xpert Xpress SARS-CoV-2/FLU/RSV testing.  Fact Sheet for Patients: EntrepreneurPulse.com.au  Fact Sheet for Healthcare Providers: IncredibleEmployment.be  This test is not yet approved or cleared by the [place] FDA and has been authorized for detection and/or diagnosis of SARS-CoV-2 by FDA under an Emergency Use Authorization (EUA). This EUA will remain in effect (meaning this test can be used) for the duration of the COVID-19 declaration under Section 564(b)(1) of the Act, 21 U.S.C. section 360bbb-3(b)(1), unless the authorization is terminated or revoked.  Performed at [facility], [address] [place], [place] [address]   Culture, blood (x 2)     Status: None (Preliminary result)    Collection Time: 09/13/21  5:00 PM   Specimen: Right Antecubital; Blood  Result Value Ref Range Status   Specimen Description RIGHT ANTECUBITAL  Final   Special Requests   Final    BOTTLES DRAWN AEROBIC AND ANAEROBIC Blood Culture adequate volume   Culture   Final    NO GROWTH 3 DAYS Performed at [facility], [address] [place], [place] [address]    Report Status PENDING  Incomplete  Culture, blood (x 2)     Status: None (Preliminary result)   Collection Time: 09/13/21  5:05 PM   Specimen: BLOOD LEFT HAND  Result Value Ref Range Status   Specimen Description BLOOD LEFT HAND  Final   Special Requests   Final    BOTTLES DRAWN AEROBIC AND ANAEROBIC Blood Culture adequate volume   Culture   Final    NO GROWTH 3 DAYS Performed at [facility], [address], [place], [place] [address]    Report Status PENDING  Incomplete         Radiology Studies: No results found.      Scheduled Meds:  acyclovir  400 mg Oral BID   amiodarone  100 mg Oral 2 times per day on Mon Tue Thu Fri Sat   apixaban  5 mg Oral BID   atorvastatin  80 mg Oral QHS   azelastine  2 spray Each Nare BID   azithromycin  250 mg Oral Daily   ezetimibe  10 mg Oral Daily   ipratropium  2 puff Inhalation Q4H   isosorbide mononitrate  30 mg Oral Daily   methylPREDNISolone (SOLU-MEDROL) injection  80 mg Intravenous Q24H   mometasone-formoterol  2 puff Inhalation BID   multivitamin with minerals  1 tablet Oral Daily   pantoprazole  40 mg Oral Daily   venetoclax  100 mg Oral Q breakfast   Continuous Infusions:   LOS: 3 days    Time spent: 15 mins     Wyvonnia Dusky, MD [facility] Pager [phone]  If 7PM-7AM, please contact night-coverage 09/16/2021, 8:32 AM

## 2021-09-16 NOTE — Progress Notes (Signed)
On 2/27-visited the pt to check.  Reassured that his current symptoms are unlikely from progression of disease.  Likely from immunosuppression-from underlying disease/therapy.  Would recommend checking quantitative immunoglobulins for possible IVIG therapy.. GB  No charge.

## 2021-09-17 ENCOUNTER — Ambulatory Visit: Payer: Medicare HMO | Admitting: Primary Care

## 2021-09-17 LAB — CBC
HCT: 32.5 % — ABNORMAL LOW (ref 39.0–52.0)
Hemoglobin: 11 g/dL — ABNORMAL LOW (ref 13.0–17.0)
MCH: 34.2 pg — ABNORMAL HIGH (ref 26.0–34.0)
MCHC: 33.8 g/dL (ref 30.0–36.0)
MCV: 100.9 fL — ABNORMAL HIGH (ref 80.0–100.0)
Platelets: 117 10*3/uL — ABNORMAL LOW (ref 150–400)
RBC: 3.22 MIL/uL — ABNORMAL LOW (ref 4.22–5.81)
RDW: 14.4 % (ref 11.5–15.5)
WBC: 11 10*3/uL — ABNORMAL HIGH (ref 4.0–10.5)
nRBC: 0 % (ref 0.0–0.2)

## 2021-09-17 LAB — BASIC METABOLIC PANEL
Anion gap: 9 (ref 5–15)
BUN: 23 mg/dL (ref 8–23)
CO2: 24 mmol/L (ref 22–32)
Calcium: 8.4 mg/dL — ABNORMAL LOW (ref 8.9–10.3)
Chloride: 103 mmol/L (ref 98–111)
Creatinine, Ser: 0.73 mg/dL (ref 0.61–1.24)
GFR, Estimated: >60 mL/min (ref >60)
Glucose, Bld: 254 mg/dL — ABNORMAL HIGH (ref 70–99)
Potassium: 4.5 mmol/L (ref 3.5–5.1)
Sodium: 136 mmol/L (ref 135–145)

## 2021-09-17 LAB — COMPREHENSIVE METABOLIC PANEL
ALT: 35 U/L (ref 0–44)
AST: 26 U/L (ref 15–41)
Albumin: 3 g/dL — ABNORMAL LOW (ref 3.5–5.0)
Alkaline Phosphatase: 63 U/L (ref 38–126)
Anion gap: 9 (ref 5–15)
BUN: 30 mg/dL — ABNORMAL HIGH (ref 8–23)
CO2: 25 mmol/L (ref 22–32)
Calcium: 8 mg/dL — ABNORMAL LOW (ref 8.9–10.3)
Chloride: 101 mmol/L (ref 98–111)
Creatinine, Ser: 0.89 mg/dL (ref 0.61–1.24)
GFR, Estimated: >60 mL/min (ref >60)
Glucose, Bld: 227 mg/dL — ABNORMAL HIGH (ref 70–99)
Potassium: 4.4 mmol/L (ref 3.5–5.1)
Sodium: 135 mmol/L (ref 135–145)
Total Bilirubin: 1.5 mg/dL — ABNORMAL HIGH (ref 0.3–1.2)
Total Protein: 5.6 g/dL — ABNORMAL LOW (ref 6.5–8.1)

## 2021-09-17 MED ORDER — METHYLPREDNISOLONE SODIUM SUCC 125 MG IJ SOLR
60.0000 mg | INTRAMUSCULAR | Status: DC
Start: 2021-09-17 — End: 2021-09-18
  Administered 2021-09-17: 60 mg via INTRAVENOUS
  Filled 2021-09-17 (×2): qty 2

## 2021-09-17 NOTE — Progress Notes (Signed)
Physical Therapy Treatment Patient Details Name: [name] MRN: [id number] DOB: 06-12-1946 Today's Date: 09/17/2021   History of Present Illness Pt admitted to [facility] on 09/13/21 for c/o weakness, SOB, cough, and multiple falls. COVID positive last wednesday with mild hypoxia on RA, improved with supplemntal O2. 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 Imaging unremarkable.    PT Comments    Pt tolerated treatment well today and was able to improve overall assist levels, balance, ambulation distance, self-limiting behavior, and activity tolerance since IE. Pt required supervision for safety with bed mobility, CGA for transfers, and CGA for multiple gait trials in RW. Performed walk test on RA; ambulated 83ft and desat to 80-85% but able to recover with supplemental O2, PLB, and seated rest break. Improved balance, endurance, and quality of gait noted during gait trial with supplemental O2 vs. No O2. Pt eager for continued mobility despite significant labored breathing. Required education for activity pacing, O2 weaning, and appropriate progression of activity; he verbalized understanding. Pt will continue to benefit from skilled acute PT services to address deficits for return to baseline function. Will continue to recommend SNF at DC.      Recommendations for follow up therapy are one component of a multi-disciplinary discharge planning process, led by the attending physician.  Recommendations may be updated based on patient status, additional functional criteria and insurance authorization.  Follow Up Recommendations  Skilled nursing-short term rehab (<3 hours/day)     Assistance Recommended at Discharge Intermittent Supervision/Assistance  Patient can return home with the following Assistance with cooking/housework;Help  with stairs or ramp for entrance;A little help with walking and/or transfers;A little help with bathing/dressing/bathroom   Equipment Recommendations   (defer to post acute)       Precautions / Restrictions Precautions Precautions: Fall Restrictions Weight Bearing Restrictions: No     Mobility  Bed Mobility Overal bed mobility: Needs Assistance Bed Mobility: Supine to Sit     Supine to sit: HOB elevated, Supervision     General bed mobility comments: supervision for safety to sit EOB, HOB elevated, increased time/effort and reliance on bedrail for assist    Transfers Overall transfer level: Needs assistance Equipment used: Rolling walker (2 wheels) Transfers: Sit to/from Stand Sit to Stand: Min guard           General transfer comment: for safety for power to stand from EOB x2 with RW; cues for hand placement. Increased time/effort to achieve full upright standing    Ambulation/Gait Ambulation/Gait assistance: Min guard Gait Distance (Feet): 68 Feet (65ft x2 (seated rest)) Assistive device: Rolling walker (2 wheels)         General Gait Details: Walk test performed on RA; ambulated 5ft, demonstrating significant labored breathing, slowed cadence, and decreased step length/foot clearance bil. Dropped to 80-85% on RA but able to recover >90% with seated rest break, cues for PLB, and 3L O2 via [place]. Able to ambulate same distance on 4L with SpO2 >90%; demonstrates improved cadence and quality of gait.      Balance Overall balance assessment: Needs assistance Sitting-balance support: Bilateral upper extremity supported, Feet supported Sitting balance-Leahy Scale: Good     Standing balance support: During functional activity, Bilateral upper extremity supported Standing balance-Leahy Scale: Fair Standing balance comment: BUE support on RW  Cognition Arousal/Alertness: Awake/alert Behavior During Therapy: WFL for tasks  assessed/performed Overall Cognitive Status: Within Functional Limits for tasks assessed                                 General Comments: labored breathing; able to follow 100% of simple 3-step commands; HOH        Exercises Other Exercises Other Exercises: Bed mobility, transfers, and gait. Increased time/effort for rest breaks and cues for PLB. Educated re: PT role/POC, DC recommendations, activity pacing, walk test, appropriate progression of activity, benefits of OOB. He verbalized understanding.    General Comments General comments (skin integrity, edema, etc.): SpO2 80-85% after gait trial on RA, able to recover with cues for pursed lip breathing, seated rest break, and 3L O2. Ambulated on 4L and able to maintain >90%. Continued to have significant labored breathing after each gait trial.      Pertinent Vitals/Pain Pain Assessment Pain Assessment: No/denies pain     PT Goals (current goals can now be found in the care plan section) Acute Rehab PT Goals Patient Stated Goal: "get better" PT Goal Formulation: With patient Time For Goal Achievement: 09/28/21 Potential to Achieve Goals: Good Progress towards PT goals: Progressing toward goals    Frequency    Min 2X/week      PT Plan Current plan remains appropriate       AM-PAC PT "6 Clicks" Mobility   Outcome Measure  Help needed turning from your back to your side while in a flat bed without using bedrails?: A Little Help needed moving from lying on your back to sitting on the side of a flat bed without using bedrails?: A Little Help needed moving to and from a bed to a chair (including a wheelchair)?: A Little Help needed standing up from a chair using your arms (e.g., wheelchair or bedside chair)?: A Little Help needed to walk in hospital room?: A Little Help needed climbing 3-5 steps with a railing? : A Little 6 Click Score: 18    End of Session Equipment Utilized During Treatment: Gait  belt;Oxygen (3-4L) Activity Tolerance: Patient tolerated treatment well;Patient limited by fatigue Patient left: in chair;with call bell/phone within reach;with chair alarm set Nurse Communication: Mobility status (vitals) PT Visit Diagnosis: Unsteadiness on feet (R26.81);Muscle weakness (generalized) (M62.81);History of falling (Z91.81);Difficulty in walking, not elsewhere classified (R26.2)     Time: 2440-1027 PT Time Calculation (min) (ACUTE ONLY): 30 min  Charges:  $Therapeutic Exercise: 23-37 mins                      Herminio Commons, PT, DPT 11:33 AM,09/17/21

## 2021-09-17 NOTE — Progress Notes (Signed)
PROGRESS NOTE   HPI was taken from Dr. Blaine Hamper: [name] is a 76 y.o. male with medical history significant of hypertension, hyperlipidemia, COPD not on oxygen, TIA, GERD, depression with anxiety, PTSD, OSA on CPAP, pacemaker placement due to SSS, CAD, CLL, atrial fibrillation on Eliquis, dCHF, who presents with fever, chills, cough, shortness breath, chest pain and fall.   Patient states that he had a positive home COVID test about a week ago.  He still has fever, chills, dry cough, shortness of breath, chest pain.  His shortness breath has been progressively worsening.  His chest pain is located in front chest, constant, mild to moderate, nonradiating, pleuritic, aggravated by deep breath and coughing.  Patient has generalized weakness, no unilateral numbness or tinglings in extremities.  Patient states that he has had multiple full in the past several days.  No significant injury.  No headache or neck pain.  Patient does not have nausea, vomiting, diarrhea or abdominal pain.  No symptoms of UTI.   Data Reviewed and ED Course: pt was found to have positive COVID PCR, WBC 10.9, troponin level 22, 21, lactic acid 1.2, mild AKI with creatinine 1.25, BUN 24, GFR 60 (baseline creatinine 0.92 on 09/06/2021), temperature 101.3, blood pressure 125/67, heart rate 140, 80, RR 22, oxygen saturation 80% on room air initially, currently 91-92% on room air.  CT of head negative for acute intracranial abnormalities.  CT angiogram is negative for PE.  Chest x-ray is negative for obvious infiltration.  Patient is admitted to [facility] bed as inpatient.  Hospital course from Dr. Jimmye Norman 2/25-2/28/23: Pt presented w/ sepsis secondary to [place] & COPD exacerbation. Pt is being treated w/ steroids, bronchodilators & encourage incentive spirometry & flutter valve. Pt completed azithromycin course already. Pt was been weaned off of oxygen but it was placed back on only for comfort only as per pt's request. PT/OT evaluated  pt and recommends SNF.    SANDOR ARBOLEDA  JGO:[id number] DOB: October 07, 1945 DOA: 09/13/2021 PCP: Leone Haven, MD   Assessment & Plan:   Principal Problem:   Acute respiratory disease due to COVID-19 virus Active Problems:   Hyperlipidemia   Essential hypertension   Paroxysmal atrial fibrillation (HCC)   COPD (chronic obstructive pulmonary disease) (HCC)   CLL (chronic lymphocytic leukemia) (HCC)   Chronic diastolic CHF (congestive heart failure) (HCC)   OSA on CPAP   Thrombocytopenia (HCC)   CAD (coronary artery disease)   Elevated troponin   Fall at home, initial encounter   Sepsis ([place])   AKI (acute kidney injury) ([place])   Sepsis: met criteria w/ tachycardia, tachypnea & secondary to COVID-19 virus. CT angiogram is negative for PE.  No obvious infiltration on chest x-ray.  Blood cxs NGTD. Sepsis resolved   Acute hypoxic respiratory failure: had oxygen desaturation to 88% on RA. Likely secondary to [place]. Weaned off of oxygen & now only on oxygen for comfort as per pt's request    COVID19: initially tested positive on 09/01/21 at home, possibly long hauler. With significant cough today. Started on tussionex. Continue on IV steroids, bronchodilators & encourage flutter valve & incentive spirometry. Out of window for remdesivir, so it was d/c on admission.    HLD: continue on zetia, statin    HTN: continue on imdur    PAF: continue on eliquis, amio     COPD exacerbation: completed azithromycin course. Continue on steroids & bronchodilators. Encourage incentive spirometry & flutter valve    Chronic diastolic TDH:RCBU on 09/26/4534  showed EF of 55 to 60% with grade 1 diastolic dysfunction. CHF seem to be compensated. Monitor I/Os  Anxiety:  severity unknown. Xanax prn    OSA: continue on CPAP    Thrombocytopenia: chronic. Likely CLL   Elevated troponin: likely secondary to demand ischemia. Hx of CAD. Continue on statin, imdur & nitro prn    Fall: at home. PT/OT recs SNF     AKI: resolved    CLL: management per onco    Obesity: BMI 32.4. Complicates overall care & prognosis   DVT prophylaxis: eliquis  Code Status: DNR Family Communication: discussed pt's care w/ pt's wife, Sheria Lang, and answered her questions  Disposition Plan: likely d/c to SNF   Level of care: Med-Surg  Status is: Inpatient Remains inpatient appropriate because: severity of illness, still SOB and requiring IV steroids     Consultants:    Procedures:   Antimicrobials:    Subjective: Pt c/o shortness of breath & generalized weakness   Objective: Vitals:   09/16/21 1713 09/16/21 2048 09/17/21 0507 09/17/21 0916  BP: 125/61 117/61 135/71 (!) 141/70  Pulse: 74 68 65 63  Resp: $Remo'20 18 18 18  'rrEQz$ Temp: 98.1 F (36.7 C) 97.9 F (36.6 C) 97.9 F (36.6 C) 97.9 F (36.6 C)  TempSrc:      SpO2: 96% 96% 94% 97%  Weight:      Height:        Intake/Output Summary (Last 24 hours) at 09/17/2021 1441 Last data filed at 09/17/2021 1002 Gross per 24 hour  Intake --  Output 750 ml  Net -750 ml   Filed Weights   09/13/21 1124  Weight: 99.7 kg    Examination:  General exam: Appears calm but uncomfortable  Respiratory system: decreased breath sounds b/l  Cardiovascular system: S1/S2+. No gallops or rubs  Gastrointestinal system: Abd is soft, NT, obese & hypoactive bowel sounds Central nervous system: alert and oriented. Moves all extremities  Psychiatry: Judgement and insight appear at baseline.  Agitated/frustrated mood and affect      Data Reviewed: I have personally reviewed following labs and imaging studies  CBC: Recent Labs  Lab 09/13/21 1122 09/14/21 0505 09/16/21 0232 09/17/21 0654  WBC 10.9* 7.2 11.6* 11.0*  NEUTROABS  --  5.2  --   --   HGB 13.2 12.8* 11.3* 11.0*  HCT 40.4 39.2 33.9* 32.5*  MCV 103.9* 102.1* 101.2* 100.9*  PLT 113* 110* 115* 161*   Basic Metabolic Panel: Recent Labs  Lab 09/13/21 1122 09/14/21 0505 09/15/21 0602 09/16/21 0232  09/17/21 0654  NA 136 136 133* 135 136  K 3.7 4.1 4.2 4.4 4.5  CL 97* 100 100 101 103  CO2 $Re'27 25 25 25 24  'Mno$ GLUCOSE 130* 182* 223* 139* 254*  BUN 24* 24* 30* 30* 23  CREATININE 1.25* 0.88 0.89 0.89 0.73  CALCIUM 8.3* 8.0* 8.0* 8.0* 8.4*   GFR: Estimated Creatinine Clearance: 91.4 mL/min (by C-G formula based on SCr of 0.73 mg/dL). Liver Function Tests: Recent Labs  Lab 09/13/21 1122 09/14/21 0505 09/15/21 0602 09/16/21 0232  AST $Re'29 27 29 26  'nLF$ ALT 27 30 34 35  ALKPHOS 73 68 59 63  BILITOT 1.8* 1.6* 1.2 1.5*  PROT 6.9 6.2* 5.7* 5.6*  ALBUMIN 3.8 3.4* 3.1* 3.0*   No results for input(s): LIPASE, AMYLASE in the last 168 hours. No results for input(s): AMMONIA in the last 168 hours. Coagulation Profile: No results for input(s): INR, PROTIME in the last 168 hours. Cardiac  Enzymes: No results for input(s): CKTOTAL, CKMB, CKMBINDEX, TROPONINI in the last 168 hours. BNP (last 3 results) No results for input(s): PROBNP in the last 8760 hours. HbA1C: No results for input(s): HGBA1C in the last 72 hours. CBG: No results for input(s): GLUCAP in the last 168 hours. Lipid Profile: No results for input(s): CHOL, HDL, LDLCALC, TRIG, CHOLHDL, LDLDIRECT in the last 72 hours. Thyroid Function Tests: No results for input(s): TSH, T4TOTAL, FREET4, T3FREE, THYROIDAB in the last 72 hours. Anemia Panel: No results for input(s): VITAMINB12, FOLATE, FERRITIN, TIBC, IRON, RETICCTPCT in the last 72 hours.  Sepsis Labs: Recent Labs  Lab 09/13/21 1122 09/13/21 1132 09/13/21 1550  PROCALCITON <0.10  --   --   LATICACIDVEN  --  1.2 1.0    Recent Results (from the past 240 hour(s))  Resp Panel by RT-PCR (Flu A&B, Covid) Nasopharyngeal Swab     Status: Abnormal   Collection Time: 09/13/21 12:46 PM   Specimen: Nasopharyngeal Swab; Nasopharyngeal(NP) swabs in vial transport medium  Result Value Ref Range Status   SARS Coronavirus 2 by RT PCR POSITIVE (A) NEGATIVE Final    Comment:  (NOTE) SARS-CoV-2 target nucleic acids are DETECTED.  The SARS-CoV-2 RNA is generally detectable in upper respiratory specimens during the acute phase of infection. Positive results are indicative of the presence of the identified virus, but do not rule out bacterial infection or co-infection with other pathogens not detected by the test. Clinical correlation with patient history and other diagnostic information is necessary to determine patient infection status. The expected result is Negative.  Fact Sheet for Patients: EntrepreneurPulse.com.au  Fact Sheet for Healthcare Providers: IncredibleEmployment.be  This test is not yet approved or cleared by the [place] FDA and  has been authorized for detection and/or diagnosis of SARS-CoV-2 by FDA under an Emergency Use Authorization (EUA).  This EUA will remain in effect (meaning this test can be used) for the duration of  the COVID-19 declaration under Section 564(b)(1) of the A ct, 21 U.S.C. section 360bbb-3(b)(1), unless the authorization is terminated or revoked sooner.     Influenza A by PCR NEGATIVE NEGATIVE Final   Influenza B by PCR NEGATIVE NEGATIVE Final    Comment: (NOTE) The Xpert Xpress SARS-CoV-2/FLU/RSV plus assay is intended as an aid in the diagnosis of influenza from Nasopharyngeal swab specimens and should not be used as a sole basis for treatment. Nasal washings and aspirates are unacceptable for Xpert Xpress SARS-CoV-2/FLU/RSV testing.  Fact Sheet for Patients: EntrepreneurPulse.com.au  Fact Sheet for Healthcare Providers: IncredibleEmployment.be  This test is not yet approved or cleared by the [place] FDA and has been authorized for detection and/or diagnosis of SARS-CoV-2 by FDA under an Emergency Use Authorization (EUA). This EUA will remain in effect (meaning this test can be used) for the duration of the COVID-19  declaration under Section 564(b)(1) of the Act, 21 U.S.C. section 360bbb-3(b)(1), unless the authorization is terminated or revoked.  Performed at [facility], [address] [place], [place] [address]   Culture, blood (x 2)     Status: None (Preliminary result)   Collection Time: 09/13/21  5:00 PM   Specimen: Right Antecubital; Blood  Result Value Ref Range Status   Specimen Description RIGHT ANTECUBITAL  Final   Special Requests   Final    BOTTLES DRAWN AEROBIC AND ANAEROBIC Blood Culture adequate volume   Culture   Final    NO GROWTH 3 DAYS Performed at [facility], [address] [place], [place] [address]    Report Status PENDING  Incomplete  Culture, blood (x 2)     Status: None (Preliminary result)   Collection Time: 09/13/21  5:05 PM   Specimen: BLOOD LEFT HAND  Result Value Ref Range Status   Specimen Description BLOOD LEFT HAND  Final   Special Requests   Final    BOTTLES DRAWN AEROBIC AND ANAEROBIC Blood Culture adequate volume   Culture   Final    NO GROWTH 3 DAYS Performed at [facility], [address], [place], [place] [address]    Report Status PENDING  Incomplete         Radiology Studies: No results found.      Scheduled Meds:  acyclovir  400 mg Oral BID   amiodarone  100 mg Oral 2 times per day on Mon Tue Thu Fri Sat   apixaban  5 mg Oral BID   atorvastatin  80 mg Oral QHS   azelastine  2 spray Each Nare BID   ezetimibe  10 mg Oral Daily   ipratropium  2 puff Inhalation Q4H   isosorbide mononitrate  30 mg Oral Daily   methylPREDNISolone (SOLU-MEDROL) injection  60 mg Intravenous Q24H   mometasone-formoterol  2 puff Inhalation BID   multivitamin with minerals  1 tablet Oral Daily   pantoprazole  40 mg Oral Daily   Continuous Infusions:   LOS: 4 days    Time spent: 15 mins     Wyvonnia Dusky, MD [facility] Pager [phone]  If 7PM-7AM, please contact night-coverage 09/17/2021, 2:41  PM

## 2021-09-18 ENCOUNTER — Encounter: Payer: Self-pay | Admitting: Internal Medicine

## 2021-09-18 LAB — CBC
HCT: 31.9 % — ABNORMAL LOW (ref 39.0–52.0)
Hemoglobin: 10.7 g/dL — ABNORMAL LOW (ref 13.0–17.0)
MCH: 33.8 pg (ref 26.0–34.0)
MCHC: 33.5 g/dL (ref 30.0–36.0)
MCV: 100.6 fL — ABNORMAL HIGH (ref 80.0–100.0)
Platelets: 122 10*3/uL — ABNORMAL LOW (ref 150–400)
RBC: 3.17 MIL/uL — ABNORMAL LOW (ref 4.22–5.81)
RDW: 14.2 % (ref 11.5–15.5)
WBC: 11 10*3/uL — ABNORMAL HIGH (ref 4.0–10.5)
nRBC: 0 % (ref 0.0–0.2)

## 2021-09-18 LAB — CULTURE, BLOOD (ROUTINE X 2)
Culture: NO GROWTH
Culture: NO GROWTH
Special Requests: ADEQUATE
Special Requests: ADEQUATE

## 2021-09-18 LAB — BASIC METABOLIC PANEL
Anion gap: 7 (ref 5–15)
BUN: 24 mg/dL — ABNORMAL HIGH (ref 8–23)
CO2: 26 mmol/L (ref 22–32)
Calcium: 8.2 mg/dL — ABNORMAL LOW (ref 8.9–10.3)
Chloride: 104 mmol/L (ref 98–111)
Creatinine, Ser: 0.85 mg/dL (ref 0.61–1.24)
GFR, Estimated: >60 mL/min (ref >60)
Glucose, Bld: 263 mg/dL — ABNORMAL HIGH (ref 70–99)
Potassium: 4.6 mmol/L (ref 3.5–5.1)
Sodium: 137 mmol/L (ref 135–145)

## 2021-09-18 LAB — GLUCOSE, CAPILLARY
Glucose-Capillary: 182 mg/dL — ABNORMAL HIGH (ref 70–99)
Glucose-Capillary: 252 mg/dL — ABNORMAL HIGH (ref 70–99)
Glucose-Capillary: 241 mg/dL — ABNORMAL HIGH (ref 70–99)

## 2021-09-18 MED ORDER — INSULIN ASPART 100 UNIT/ML IJ SOLN
0.0000 [IU] | Freq: Three times a day (TID) | INTRAMUSCULAR | Status: DC
  Administered 2021-09-18: 3 [IU] via SUBCUTANEOUS
  Administered 2021-09-19: 8 [IU] via SUBCUTANEOUS
  Administered 2021-09-19: 5 [IU] via SUBCUTANEOUS
  Administered 2021-09-19: 8 [IU] via SUBCUTANEOUS
  Filled 2021-09-18 (×4): qty 1

## 2021-09-18 MED ORDER — INSULIN ASPART 100 UNIT/ML IJ SOLN
0.0000 [IU] | Freq: Every day | INTRAMUSCULAR | Status: DC
  Administered 2021-09-18: 2 [IU] via SUBCUTANEOUS
  Filled 2021-09-18: qty 1

## 2021-09-18 MED ORDER — DM-GUAIFENESIN ER 30-600 MG PO TB12
1.0000 | ORAL_TABLET | Freq: Two times a day (BID) | ORAL | Status: DC
  Administered 2021-09-18 – 2021-09-19 (×2): 1 via ORAL
  Filled 2021-09-18 (×3): qty 1

## 2021-09-18 MED ORDER — HYDROCOD POLI-CHLORPHE POLI ER 10-8 MG/5ML PO SUER
5.0000 mL | Freq: Two times a day (BID) | ORAL | Status: DC
  Administered 2021-09-18 – 2021-09-19 (×2): 5 mL via ORAL
  Filled 2021-09-18 (×2): qty 5

## 2021-09-18 MED ORDER — METHYLPREDNISOLONE SODIUM SUCC 125 MG IJ SOLR
60.0000 mg | Freq: Two times a day (BID) | INTRAMUSCULAR | Status: DC
  Administered 2021-09-18 – 2021-09-19 (×2): 60 mg via INTRAVENOUS
  Filled 2021-09-18 (×2): qty 2

## 2021-09-18 NOTE — Progress Notes (Signed)
?Progress Note ? ? ?Patient: [name] GFQ:[id number] DOB: 1945-09-29 DOA: 09/13/2021     5 ?DOS: the patient was seen and examined on 09/18/2021 ?  ?Brief hospital course: ?LAREN WHALING is a 76 y.o. 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 ?  ?Patient states that he had a positive home COVID test about a week prior to admission. ?He also had other family members in the house recovering from [place] as well.  He continued to have ongoing respiratory symptoms and shortness of breath therefore presented for further evaluation.  He again tested positive for COVID-19 on 09/13/2021 and prior positive test was on 09/01/2021. ?Given duration of his symptoms he was not considered a candidate for continuing on remdesivir.  He was however considered to have exacerbation of his COPD due to ongoing respiratory symptoms and was started on breathing treatments and steroids. ? ?Assessment and Plan: ? ?Sepsis ?- met criteria w/ tachycardia, tachypnea & secondary to COVID-19 virus. CT angiogram is negative for PE.  No obvious infiltration on chest x-ray.  Blood cxs NGTD. Sepsis resolved  ?  ?Acute hypoxic respiratory failure ?- had oxygen desaturation to 88% on RA. Likely secondary to [place]. Weaned off of oxygen but some desat with exertion ?- repeat O2 ambulatory test tomorrow ?  ?COVID19: ?- initially tested positive on 09/01/21 at home, possibly slow to clear in setting of underlying CLL diagnosis ?- Was not considered candidate for remdesivir due to length of duration of symptoms ?- Continue breathing treatments and supportive care ?- Continue incentive spirometer, he is pulling again good volumes up to 1500 cc ?  ?COPD exacerbation ?- completed azithromycin course.  Continue on steroids & bronchodilators. Encourage incentive spirometry & flutter valve  ? ?HLD: continue on zetia, statin  ?  ?HTN: continue on imdur  ?  ?PAF: continue on eliquis, amio   ?  ?Chronic diastolic VWA:QLRJ on 01/21/6680 showed EF of 55 to 60% with grade 1 diastolic dysfunction. CHF seem to be compensated. Monitor I/Os ?  ?Anxiety:  severity unknown. Xanax prn  ?  ?OSA: continue on CPAP  ?  ?Thrombocytopenia: chronic. Likely CLL  ?  ?Elevated troponin: likely secondary to demand ischemia. Hx of CAD. Continue on statin, imdur & nitro prn  ?  ?Fall: at home. PT/OT recs SNF  ?  ?AKI: resolved  ?  ?CLL: management per onco  ?  ?Obesity: BMI 32.4. Complicates overall care & prognosis  ? ? ? ? ? ?Subjective: Sitting up in recliner this morning still coughing and feeling dyspneic although he is pulling good volumes on spirometer and saturating adequately.  He also ambulated well with staff this morning and lowest saturation was 88% while walking. ? ?Physical Exam: ?Vitals:  ? 09/18/21 0433 09/18/21 0736 09/18/21 1139 09/18/21 1611  ?BP: 129/61 129/78 130/68 (!) 127/56  ?Pulse: 67 76 74 75  ?Resp: $Remov'18 19 16 17  'Dsyded$ ?Temp: 98.1 ?F (36.7 ?C) 98.3 ?F (36.8 ?C) 98.3 ?F (36.8 ?C) 98.8 ?F (37.1 ?C)  ?TempSrc:    Oral  ?SpO2: 94% 90% 90% 92%  ?Weight:      ?Height:      ? ?Physical Exam ?Constitutional:   ?   General: He is not in acute distress. ?   Appearance: Normal  appearance.  ?HENT:  ?   Head: Normocephalic and atraumatic.  ?   Mouth/Throat:  ?   Mouth: Mucous membranes are moist.  ?Eyes:  ?   Extraocular Movements: Extraocular movements intact.  ?Cardiovascular:  ?   Rate and Rhythm: Normal rate and regular rhythm.  ?   Heart sounds: Normal heart sounds.  ?Pulmonary:  ?   Effort: No respiratory distress.  ?   Breath sounds: Wheezing present.  ?   Comments: Diffuse coarse breath sounds bilaterally with expiratory wheezing ?Abdominal:  ?   General: Bowel sounds are normal. There is no distension.  ?   Palpations: Abdomen  is soft.  ?   Tenderness: There is no abdominal tenderness.  ?Musculoskeletal:     ?   General: Normal range of motion.  ?   Cervical back: Normal range of motion and neck supple.  ?Skin: ?   General: Skin is warm and dry.  ?Neurological:  ?   General: No focal deficit present.  ?   Mental Status: He is alert.  ?Psychiatric:     ?   Mood and Affect: Mood normal.     ?   Behavior: Behavior normal.  ? ? ? ?Data Reviewed: ? ?Results for orders placed or performed during the hospital encounter of 09/13/21 (from the past 24 hour(s))  ?CBC     Status: Abnormal  ? Collection Time: 09/18/21  3:46 AM  ?Result Value Ref Range  ? WBC 11.0 (H) 4.0 - 10.5 K/uL  ? RBC 3.17 (L) 4.22 - 5.81 MIL/uL  ? Hemoglobin 10.7 (L) 13.0 - 17.0 g/dL  ? HCT 31.9 (L) 39.0 - 52.0 %  ? MCV 100.6 (H) 80.0 - 100.0 fL  ? MCH 33.8 26.0 - 34.0 pg  ? MCHC 33.5 30.0 - 36.0 g/dL  ? RDW 14.2 11.5 - 15.5 %  ? Platelets 122 (L) 150 - 400 K/uL  ? nRBC 0.0 0.0 - 0.2 %  ?Basic metabolic panel     Status: Abnormal  ? Collection Time: 09/18/21  3:46 AM  ?Result Value Ref Range  ? Sodium 137 135 - 145 mmol/L  ? Potassium 4.6 3.5 - 5.1 mmol/L  ? Chloride 104 98 - 111 mmol/L  ? CO2 26 22 - 32 mmol/L  ? Glucose, Bld 263 (H) 70 - 99 mg/dL  ? BUN 24 (H) 8 - 23 mg/dL  ? Creatinine, Ser 0.85 0.61 - 1.24 mg/dL  ? Calcium 8.2 (L) 8.9 - 10.3 mg/dL  ? GFR, Estimated >60 >60 mL/min  ? Anion gap 7 5 - 15  ?Glucose, capillary     Status: Abnormal  ? Collection Time: 09/18/21  3:58 PM  ?Result Value Ref Range  ? Glucose-Capillary 182 (H) 70 - 99 mg/dL  ?  ?I have Reviewed nursing notes, Vitals, and Lab results since pt's last encounter. Pertinent lab results : see above ?I have ordered test including BMP, CBC, Mg ?I have reviewed the last note from staff over past 24 hours ?I have discussed pt's care plan and test results with nursing staff, case manager ? ? ?Disposition: ?Status is: Inpatient ?Remains inpatient appropriate because: Treatment as outlined in A&P ? ? ? Planned  Discharge Destination: Home ? ?Antimicrobials: ?Azithro 2/24 >> 2/28 ? ?Consultants: ? ? ?Procedures:  ? ? ?DVT ppx:  ? ?apixaban (ELIQUIS) tablet 5 mg  ? ?  Code Status: DNR  ? ? ?Author: ?Dwyane Dee, MD ?09/18/2021 4:15 PM ? ?For on call review [email or web address].  ? ?

## 2021-09-18 NOTE — Hospital Course (Addendum)
[name] is a 76 y.o. 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 ?  ?Patient states that he had a positive home COVID test about a week prior to admission. ?He also had other family members in the house recovering from [place] as well.  He continued to have ongoing respiratory symptoms and shortness of breath therefore presented for further evaluation.  He again tested positive for COVID-19 on 09/13/2021 and prior positive test was on 09/01/2021. ?Given duration of his symptoms he was not considered a candidate for continuing on remdesivir.  He was however considered to have exacerbation of his COPD due to ongoing respiratory symptoms and was started on breathing treatments and steroids. ?See below for further problem-based plan. ?

## 2021-09-18 NOTE — Care Management Important Message (Signed)
Important Message ? ?Patient Details  ?Name: [name] ?MRN: [id number] ?Date of Birth: 01-07-1946 ? ? ?Medicare Important Message Given:  Yes ? ?Patient is in an isolation room so I called his room to review the Important Message from Medicare with him [phone] and he asked me to call his wife and review it with her. I called [name] [phone] and reviewed the form with her and she said they were in agreement with the discharge plan. I wished them both well and thanked her for her time. ? ? ?[name] ?09/18/2021, 11:52 AM ?

## 2021-09-18 NOTE — TOC Progression Note (Signed)
Transition of Care (TOC) - Progression Note  ? ? ?Patient Details  ?Name: [name] ?MRN: [id number] ?Date of Birth: [birth year] ? ?Transition of Care (TOC) CM/SW Contact  ?Conception Oms, RN ?Phone Number: ?09/18/2021, 9:19 AM ? ?Clinical Narrative:   Reached out to Tammy at [facility] to Inquire on the [employer], Awaiting a call back ? ? ? ?Expected Discharge Plan: [place] ?Barriers to Discharge: Continued Medical Work up ? ?Expected Discharge Plan and Services ?Expected Discharge Plan: [place] ?  ?  ?  ?  ?                ?  ?  ?  ?  ?  ?  ?  ?  ?  ?  ? ? ?Social Determinants of Health (SDOH) Interventions ?  ? ?Readmission Risk Interventions ?No flowsheet data found. ? ?

## 2021-09-18 NOTE — Progress Notes (Signed)
Occupational Therapy Treatment ?Patient Details ?Name: [name] ?MRN: [id number] ?DOB: 1946/05/27 ?Today's Date: 09/18/2021 ? ? ?History of present illness Pt admitted to [facility] on 09/13/21 for c/o weakness, SOB, cough, and multiple falls. COVID positive last wednesday with mild hypoxia on RA, improved with supplemntal O2. 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 Imaging unremarkable. ?  ?OT comments ? Mr. Cragg was seen for OT treatment on this date. Upon arrival to room pt awake/alert, seated in recliner, notably SOB and endorsing increased fatigue from recent PT session. Pt agreeable to OT tx session and eager to problem solve strategies to support his safety and independence with ADL management upon DC home. Pt educated on energy conservation techniques including acttivity pacing, pursed lip breathing, body positioning, and prioritizing meaningful occupations. He requires consistent cueing to utilize energy conservation strategies t/o session. SUPERVISION - MIN Guard for ADL management during session. Pt making good progress toward goals. Pt continues to benefit from skilled OT services to maximize return to PLOF and minimize risk of future falls, injury, caregiver burden, and readmission. Will continue to follow POC. Discharge recommendation remains appropriate.  ?  ? ?Recommendations for follow up therapy are one component of a multi-disciplinary discharge planning process, led by the attending physician.  Recommendations may be updated based on patient status, additional functional criteria and insurance authorization. ?   ?Follow Up Recommendations ? Skilled nursing-short term rehab (<3 hours/day)  ?  ?Assistance Recommended at Discharge Frequent or constant Supervision/Assistance  ?Patient can return home with the following ? A little help  with walking and/or transfers;A lot of help with bathing/dressing/bathroom ?  ?Equipment Recommendations ?    ?  ?Recommendations for Other Services   ? ?  ?Precautions / Restrictions Precautions ?Precautions: Fall ?Restrictions ?Weight Bearing Restrictions: No  ? ? ?  ? ?Mobility Bed Mobility ?Overal bed mobility: Needs Assistance ?  ?  ?  ?  ?  ?  ?General bed mobility comments: seated in recliner at start/end of session. ?  ? ?Transfers ?Overall transfer level: Needs assistance ?Equipment used: Rolling walker (2 wheels) ?Transfers: Sit to/from Stand ?Sit to Stand: Supervision ?  ?  ?  ?  ?  ?  ?  ?  ?Balance Overall balance assessment: Needs assistance ?Sitting-balance support: Feet supported, No upper extremity supported ?Sitting balance-Leahy Scale: Good ?  ?  ?Standing balance support: During functional activity, Bilateral upper extremity supported ?Standing balance-Leahy Scale: Fair ?Standing balance comment: BUE support on RW ?  ?  ?  ?  ?  ?  ?  ?  ?  ?  ?  ?   ? ?ADL either performed or assessed with clinical judgement  ? ?ADL Overall ADL's : Needs assistance/impaired ?  ?  ?  ?  ?  ?  ?  ?  ?  ?  ?  ?  ?  ?  ?  ?  ?  ?  ?  ?General ADL Comments: Pt continues to be functionally limited by cardiopulmonary status, generalized weakness, and decreased activity tolerance. He requires SET UP assist for UB ADL management, Supervision for safety during functional mobility. ?  ? ?Extremity/Trunk Assessment Upper Extremity Assessment ?Upper Extremity Assessment: Generalized weakness ?  ?Lower Extremity Assessment ?Lower Extremity Assessment: Generalized weakness ?  ?  ?  ? ?Vision Patient Visual Report: No change from baseline ?  ?  ?  Perception   ?  ?Praxis   ?  ? ?Cognition Arousal/Alertness: Awake/alert ?Behavior During Therapy: [facility] for tasks assessed/performed ?Overall Cognitive Status: Within Functional Limits for tasks assessed ?  ?  ?  ?  ?  ?  ?  ?  ?  ?  ?  ?  ?  ?  ?  ?  ?  ?  ?  ?   ?Exercises Other  Exercises ?Other Exercises: Pt educated on energy conservation techniques including acttivity pacing, pursed lip breathing, body positioning, and prioritizing meaningful occupations. He requires consistent cueing to utilize energy conservation strategies t/o session. OT facilitated incentive spirometer use during session. ? ?  ?Shoulder Instructions   ? ? ?  ?General Comments VS monitored t/o session. Pt remains on 2L [place] and SpO2 at rest remains generally in the low 90's with brief desaturations down to 85-88%. HR remains WFL. SpO2 improves with cues for pursed lip breathing.  ? ? ?Pertinent Vitals/ Pain       Pain Assessment ?Pain Assessment: 0-10 ?Pain Score: 3  ?Pain Location: chest pain with coughing ?Pain Descriptors / Indicators: Sore, Grimacing, Guarding ?Pain Intervention(s): Limited activity within patient's tolerance, Monitored during session, Repositioned, Premedicated before session ? ?Home Living   ?  ?  ?  ?  ?  ?  ?  ?  ?  ?  ?  ?  ?  ?  ?  ?  ?  ?  ? ?  ?Prior Functioning/Environment    ?  ?  ?  ?   ? ?Frequency ? Min 2X/week  ? ? ? ? ?  ?Progress Toward Goals ? ?OT Goals(current goals can now be found in the care plan section) ? Progress towards OT goals: Progressing toward goals ? ?Acute Rehab OT Goals ?Patient Stated Goal: To get stronger and go home ?OT Goal Formulation: With patient ?Time For Goal Achievement: 09/28/21 ?Potential to Achieve Goals: Good  ?Plan Discharge plan remains appropriate;Frequency remains appropriate   ? ?Co-evaluation ? ? ?   ?  ?  ?  ?  ? ?  ?AM-PAC OT "6 Clicks" Daily Activity     ?Outcome Measure ? ? Help from another person eating meals?: None ?Help from another person taking care of personal grooming?: A Little ?Help from another person toileting, which includes using toliet, bedpan, or urinal?: A Lot ?Help from another person bathing (including washing, rinsing, drying)?: A Lot ?Help from another person to put on and taking off regular upper body clothing?: A  Little ?Help from another person to put on and taking off regular lower body clothing?: A Lot ?6 Click Score: 16 ? ?  ?End of Session Equipment Utilized During Treatment: Rolling walker (2 wheels);Oxygen ? ?OT Visit Diagnosis: Unsteadiness on feet (R26.81);Muscle weakness (generalized) (M62.81) ?  ?Activity Tolerance Patient tolerated treatment well ?  ?Patient Left in chair;with call bell/phone within reach;with chair alarm set ?  ?Nurse Communication   ?  ? ?   ? ?Time: 4854-6270 ?OT Time Calculation (min): 35 min ? ?Charges: OT General Charges ?$OT Visit: 1 Visit ?OT Treatments ?$Self Care/Home Management : 23-37 mins ? ?Shara Blazing, M.S., OTR/L ?[facility] - [facility] ?Ascom: [phone] ?09/18/21, 4:06 PM ? ?

## 2021-09-19 LAB — GLUCOSE, CAPILLARY
Glucose-Capillary: 252 mg/dL — ABNORMAL HIGH (ref 70–99)
Glucose-Capillary: 284 mg/dL — ABNORMAL HIGH (ref 70–99)
Glucose-Capillary: 239 mg/dL — ABNORMAL HIGH (ref 70–99)

## 2021-09-19 LAB — HEMOGLOBIN A1C
Hgb A1c MFr Bld: 6.4 % — ABNORMAL HIGH (ref 4.8–5.6)
Mean Plasma Glucose: 137 mg/dL

## 2021-09-19 MED ORDER — PREDNISONE 20 MG PO TABS: 40.0000 mg | ORAL_TABLET | Freq: Every day | ORAL | 0 refills | Status: AC

## 2021-09-19 NOTE — TOC Progression Note (Addendum)
Transition of Care (TOC) - Progression Note  ? ? ?Patient Details  ?Name: [name] ?MRN: [id number] ?Date of Birth: March 04, 1946 ? ?Transition of Care (TOC) CM/SW Contact  ?Conception Oms, RN ?Phone Number: ?09/19/2021, 9:57 AM ? ?Clinical Narrative:   Ins approved to go to [facility], Notified the patient of Ins approval, He asked that I call his wife and let her know He said he would prefer to get someone to take him, he understands that EMS may not be covered since he is ambulatory , I called [name] his wife and explained that EMS may not be covered by Ins due to his mobility, she stated that she would pick him up but would need to arrange around picking up her grand children at school ? ? ? ?Expected Discharge Plan: [place] ?Barriers to Discharge: Continued Medical Work up ? ?Expected Discharge Plan and Services ?Expected Discharge Plan: [place] ?  ?  ?  ?  ?                ?  ?  ?  ?  ?  ?  ?  ?  ?  ?  ? ? ?Social Determinants of Health (SDOH) Interventions ?  ? ?Readmission Risk Interventions ?No flowsheet data found. ? ?

## 2021-09-19 NOTE — Progress Notes (Signed)
Blood pressure (!) 144/72, pulse 73, temperature 98 ?F (36.7 ?C), resp. rate 16, height 5\' 9"  (1.753 m), weight 99.7 kg, SpO2 92 %. ?Vss stable iv removed site c/d/I, pt sent to facility with all personal belongings transported via ems to [facility] attempted to call report unsuccessful each time.  ?

## 2021-09-19 NOTE — Plan of Care (Signed)

## 2021-09-19 NOTE — Discharge Summary (Signed)
Physician Discharge Summary   Patient: [name] MRN: [id number] DOB: [birth year]  Admit date:     09/13/2021  Discharge date: 09/19/21  Discharge Physician: Dwyane Dee   PCP: Leone Haven, MD   Recommendations at discharge:    Follow up with oncology   Discharge Diagnoses: Principal Problem:   Acute respiratory disease due to COVID-19 virus Active Problems:   Hyperlipidemia   Essential hypertension   Paroxysmal atrial fibrillation (HCC)   COPD (chronic obstructive pulmonary disease) (HCC)   CLL (chronic lymphocytic leukemia) (HCC)   Chronic diastolic CHF (congestive heart failure) (HCC)   OSA on CPAP   Thrombocytopenia (HCC)   CAD (coronary artery disease)   Elevated troponin   Fall at home, initial encounter   Sepsis ([place])   AKI (acute kidney injury) ([place])  Resolved Problems:   * No resolved hospital problems. *   Hospital Course: KACEE KOREN is a 75 y.o. 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   Patient states that he had a positive home COVID test about a week prior to admission. He also had other family members in the house recovering from [place] as well.  He continued to have ongoing respiratory symptoms and shortness of breath therefore presented for further evaluation.  He again tested positive for COVID-19 on 09/13/2021 and prior positive test was on 09/01/2021. Given duration of his symptoms he was not considered a candidate for continuing on remdesivir.  He was however considered to have exacerbation of his COPD due to ongoing respiratory symptoms and was started on breathing treatments and steroids. See below for further problem-based plan.  Assessment and Plan:  Sepsis due to viral  pneumonia - met criteria w/ tachycardia, tachypnea & secondary to COVID-19 virus. CT angiogram is negative for PE.  No obvious infiltration on chest x-ray.  Blood cxs NGTD. Sepsis resolved    Acute hypoxic respiratory failure - had oxygen desaturation to 88% on RA. Likely secondary to COVID19 and COPD exacerbation.  - still requires 2L O2 with exertion/ambulation    COVID19: - initially tested positive on 09/01/21 at home, possibly slow to clear in setting of underlying CLL diagnosis - Was not considered candidate for remdesivir due to length of duration of symptoms - Continue breathing treatments and supportive care - Continue incentive spirometer, he is pulling again good volumes up to 1500 cc   COPD exacerbation - completed azithromycin course. Continue on steroids & bronchodilators. Encourage incentive spirometry & flutter valve    HLD: continue on zetia, statin    HTN: continue on imdur    PAF: continue on eliquis, amio     Chronic diastolic EVO:JJKK on 03/23/8181 showed EF of 55 to 60% with grade 1 diastolic dysfunction. CHF seem to be compensated. Monitor I/Os - continue lasix   Anxiety:  severity unknown. Xanax prn    OSA: continue on CPAP    Thrombocytopenia: chronic. Likely CLL    Elevated troponin: likely secondary to demand ischemia. Hx of CAD. Continue on statin, imdur & nitro prn    Fall: at home. PT/OT recs SNF    AKI: resolved    CLL: management per onco    Obesity: BMI 32.4. Complicates overall care & prognosis    Disposition:  [facility] Diet recommendation:  Discharge Diet Orders (From admission, onward)     Start     Ordered   09/19/21 0000  Diet - low sodium heart healthy        09/19/21 1324            DISCHARGE MEDICATION: Allergies as of 09/19/2021   No Known Allergies      Medication List     STOP taking these medications    pantoprazole 40 MG tablet Commonly known as: PROTONIX       TAKE these medications    acetaminophen 500 MG  tablet Commonly known as: TYLENOL Take 1,000 mg by mouth every 8 (eight) hours as needed for mild pain.   acyclovir 400 MG tablet Commonly known as: ZOVIRAX TAKE 1 TABLET(400 MG) BY MOUTH TWICE DAILY   albuterol 108 (90 Base) MCG/ACT inhaler Commonly known as: VENTOLIN HFA Inhale 2 puffs into the lungs every 6 (six) hours as needed for wheezing or shortness of breath.   amiodarone 200 MG tablet Commonly known as: PACERONE Take 1/2 tablet (100 mg) by mouth twice daily 5 days a week   apixaban 5 MG Tabs tablet Commonly known as: ELIQUIS Take 5 mg by mouth 2 (two) times daily.   atorvastatin 80 MG tablet Commonly known as: LIPITOR TAKE ONE TABLET BY MOUTH AT BEDTIME FOR CHOLESTEROL   azelastine 0.1 % nasal spray Commonly known as: ASTELIN Place 2 sprays into both nostrils 2 (two) times daily. Use in each nostril as directed   benzonatate 100 MG capsule Commonly known as: TESSALON Take 1 capsule (100 mg total) by mouth 3 (three) times daily as needed for cough.   budesonide-formoterol 160-4.5 MCG/ACT inhaler Commonly known as: SYMBICORT Inhale 2 puffs into the lungs 2 (two) times daily.   cetirizine 10 MG tablet Commonly known as: ZYRTEC Take 10 mg by mouth daily as needed for allergies.   CoQ10 200 MG Caps Take 200 mg by mouth daily.   ezetimibe 10 MG tablet Commonly known as: ZETIA Take 1 tablet (10 mg total) by mouth daily.   furosemide 20 MG tablet Commonly known as: LASIX Take 2 tablets (40 mg total) by mouth daily. Do not take on Thursdays   guaiFENesin-dextromethorphan 100-10 MG/5ML syrup Commonly known as: ROBITUSSIN DM Take 5 mLs by mouth every 4 (four) hours as needed for cough.   isosorbide mononitrate 30 MG 24 hr tablet Commonly known as: IMDUR Take 30 mg by mouth daily.   Krill Oil 350 MG Caps Take 350 mg by mouth daily as needed (Heart).   mirtazapine 15 MG tablet Commonly known as: REMERON Take 15 mg by mouth at bedtime as needed (for panic  associated with PTSD).   multivitamin with minerals Tabs tablet Take 1 tablet by mouth daily.   nitroGLYCERIN 0.4 MG SL tablet Commonly known as: NITROSTAT Place 1 tablet (0.4 mg total) under the tongue every 5 (five) minutes as needed for chest pain.   Praluent 150 MG/ML Soaj Generic drug: Alirocumab Inject 150 mg into the skin every 14 (fourteen) days.   predniSONE 20 MG tablet Commonly known as: DELTASONE Take 2 tablets (40 mg total) by mouth daily with breakfast for 5 days.   Tiotropium Bromide Monohydrate 2.5 MCG/ACT Aers Inhale 2 puffs into the lungs at bedtime. Spiriva   traMADol 50 MG tablet Commonly known as: ULTRAM Take 1 tablet (50 mg total) by mouth every 12 (twelve) hours as needed.   Venclexta 100 MG tablet Generic drug: venetoclax TAKE 1 TABLET  BY MOUTH ONCE DAILY         Discharge Exam: Filed Weights   09/13/21 1124  Weight: 99.7 kg   Physical Exam Constitutional:      General: He is not in acute distress.    Appearance: Normal appearance.  HENT:     Head: Normocephalic and atraumatic.     Mouth/Throat:     Mouth: Mucous membranes are moist.  Eyes:     Extraocular Movements: Extraocular movements intact.  Cardiovascular:     Rate and Rhythm: Normal rate and regular rhythm.     Heart sounds: Normal heart sounds.  Pulmonary:     Effort: No respiratory distress.     Comments: Improved coarse breath sounds bilaterally, no further wheezing. Abdominal:     General: Bowel sounds are normal. There is no distension.     Palpations: Abdomen is soft.     Tenderness: There is no abdominal tenderness.  Musculoskeletal:        General: Normal range of motion.     Cervical back: Normal range of motion and neck supple.  Skin:    General: Skin is warm and dry.  Neurological:     General: No focal deficit present.     Mental Status: He is alert.  Psychiatric:        Mood and Affect: Mood normal.        Behavior: Behavior normal.     Condition at  discharge: stable  The results of significant diagnostics from this hospitalization (including imaging, microbiology, ancillary and laboratory) are listed below for reference.   Imaging Studies: DG Chest 2 View  Result Date: 09/13/2021 CLINICAL DATA:  76 year old male with weakness, falls. EXAM: CHEST - 2 VIEW COMPARISON:  09/03/2021 FINDINGS: The mediastinal contours are within normal limits. No cardiomegaly. Atherosclerotic calcification of the aortic arch. 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 Mild bibasilar streaky opacities. The lungs are otherwise clear bilaterally without evidence of focal consolidation, pleural effusion, or pneumothorax. No acute osseous abnormality. IMPRESSION: 1. Mild bibasilar subsegmental atelectasis. 2.  Aortic Atherosclerosis (ICD10-I70.0). Electronically Signed   By: Ruthann Cancer M.D.   On: 09/13/2021 12:44   DG Chest 2 View  Result Date: 09/03/2021 CLINICAL DATA:  Chest pain, shortness of breath EXAM: CHEST - 2 VIEW COMPARISON:  CT chest dated May 27, 2021 FINDINGS: The heart is normal in size. Pacemaker leads terminating in the right atrium and right ventricle. 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 Atherosclerotic calcification of the aortic arch. Low lung volumes with left basilar atelectasis. No evidence of pneumonia, pulmonary edema or pleural effusion. No acute osseous abnormality. IMPRESSION: No acute cardiopulmonary process. Electronically Signed   By: Keane Police D.O.   On: 09/03/2021 18:19   CT Head Wo Contrast  Result Date: 09/13/2021 CLINICAL DATA:  Multiple falls, chest pain, history atrial fibrillation, COPD, CLL, coronary artery disease post MI, hypertension EXAM: CT HEAD WITHOUT CONTRAST TECHNIQUE: Contiguous axial images were obtained from the base of the skull through the vertex without intravenous contrast. RADIATION DOSE REDUCTION: This exam was  performed according to the departmental dose-optimization program which includes automated exposure control, adjustment of the mA and/or kV according to patient size and/or use of iterative reconstruction technique. COMPARISON:  11/02/2015 FINDINGS: Brain: Generalized atrophy. Normal ventricular morphology. No midline shift or mass effect. Small vessel chronic ischemic changes of deep cerebral white matter. No intracranial hemorrhage, mass lesion, evidence of acute infarction,  or extra-axial fluid collection. Vascular: No hyperdense vessels. Atherosclerotic calcifications of internal carotid arteries bilaterally at skull base. Skull: Intact Sinuses/Orbits: Diffuse mucosal thickening throughout paranasal sinuses with suspected mucosal retention cysts within sphenoid and maxillary sinuses Other: N/A IMPRESSION: Generalized atrophy with mild small vessel chronic ischemic changes of deep cerebral white matter. No acute intracranial abnormalities. Chronic sinus disease changes. Electronically Signed   By: Lavonia Dana M.D.   On: 09/13/2021 14:56   CT Angio Chest Pulmonary Embolism (PE) W or WO Contrast  Result Date: 09/13/2021 CLINICAL DATA:  Chest pain, history of multiple falls, question pulmonary embolism EXAM: CT ANGIOGRAPHY CHEST WITH CONTRAST TECHNIQUE: Multidetector CT imaging of the chest was performed using the standard protocol during bolus administration of intravenous contrast. Multiplanar CT image reconstructions and MIPs were obtained to evaluate the vascular anatomy. RADIATION DOSE REDUCTION: This exam was performed according to the departmental dose-optimization program which includes automated exposure control, adjustment of the mA and/or kV according to patient size and/or use of iterative reconstruction technique. CONTRAST:  85m OMNIPAQUE IOHEXOL 350 MG/ML SOLN COMPARISON:  CT chest 05/22/2021 FINDINGS: Cardiovascular: Atherosclerotic calcifications aorta, proximal great vessels, coronary arteries.  Aorta normal caliber. No aneurysm or dissection. Mild enlargement of cardiac chambers. No pericardial effusion. 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 Respiratory motion artifacts in the mid to lower lungs. Pulmonary arteries patent. No evidence of pulmonary embolism. Mediastinum/Nodes: Esophagus unremarkable. Base of cervical region normal appearance. Multiple normal and upper normal sized axillary lymph nodes bilaterally. Lungs/Pleura: Dependent atelectasis in the BILATERAL lower lobes. Subsegmental atelectasis in lingula. No acute infiltrate, pleural effusion, or pneumothorax. Upper Abdomen: Gallbladder surgically absent. Visualized upper abdomen unremarkable Musculoskeletal: No acute osseous findings. Review of the MIP images confirms the above findings. IMPRESSION: No evidence of pulmonary embolism. Scattered atherosclerotic calcifications including coronary arteries. Bibasilar atelectasis. Aortic Atherosclerosis (ICD10-I70.0). Electronically Signed   By: MLavonia DanaM.D.   On: 09/13/2021 15:07    Microbiology: Results for orders placed or performed during the hospital encounter of 09/13/21  Resp Panel by RT-PCR (Flu A&B, Covid) Nasopharyngeal Swab     Status: Abnormal   Collection Time: 09/13/21 12:46 PM   Specimen: Nasopharyngeal Swab; Nasopharyngeal(NP) swabs in vial transport medium  Result Value Ref Range Status   SARS Coronavirus 2 by RT PCR POSITIVE (A) NEGATIVE Final    Comment: (NOTE) SARS-CoV-2 target nucleic acids are DETECTED.  The SARS-CoV-2 RNA is generally detectable in upper respiratory specimens during the acute phase of infection. Positive results are indicative of the presence of the identified virus, but do not rule out bacterial infection or co-infection with other pathogens not detected by the test. Clinical correlation with patient history and other diagnostic information is necessary to determine patient infection status. The expected result is  Negative.  Fact Sheet for Patients: hEntrepreneurPulse.com.au Fact Sheet for Healthcare Providers: hIncredibleEmployment.be This test is not yet approved or cleared by the [place] and  has been authorized for detection and/or diagnosis of SARS-CoV-2 by FDA under an Emergency Use Authorization (EUA).  This EUA will remain in effect (meaning this test can be used) for the duration of  the COVID-19 declaration under Section 564(b)(1) of the A ct, 21 U.S.C. section 360bbb-3(b)(1), unless the authorization is terminated or revoked sooner.     Influenza A by PCR NEGATIVE NEGATIVE Final   Influenza B by PCR NEGATIVE NEGATIVE Final    Comment: (NOTE) The Xpert Xpress SARS-CoV-2/FLU/RSV plus assay is intended as an aid in  the diagnosis of influenza from Nasopharyngeal swab specimens and should not be used as a sole basis for treatment. Nasal washings and aspirates are unacceptable for Xpert Xpress SARS-CoV-2/FLU/RSV testing.  Fact Sheet for Patients: EntrepreneurPulse.com.au  Fact Sheet for Healthcare Providers: IncredibleEmployment.be  This test is not yet approved or cleared by the [place] FDA and has been authorized for detection and/or diagnosis of SARS-CoV-2 by FDA under an Emergency Use Authorization (EUA). This EUA will remain in effect (meaning this test can be used) for the duration of the COVID-19 declaration under Section 564(b)(1) of the Act, 21 U.S.C. section 360bbb-3(b)(1), unless the authorization is terminated or revoked.  Performed at [facility], [address] [place], [place] [address]   Culture, blood (x 2)     Status: None   Collection Time: 09/13/21  5:00 PM   Specimen: Right Antecubital; Blood  Result Value Ref Range Status   Specimen Description RIGHT ANTECUBITAL  Final   Special Requests   Final    BOTTLES DRAWN AEROBIC AND ANAEROBIC Blood Culture  adequate volume   Culture   Final    NO GROWTH 5 DAYS Performed at [facility], [address] [place], [place] [address]    Report Status 09/18/2021 FINAL  Final  Culture, blood (x 2)     Status: None   Collection Time: 09/13/21  5:05 PM   Specimen: BLOOD LEFT HAND  Result Value Ref Range Status   Specimen Description BLOOD LEFT HAND  Final   Special Requests   Final    BOTTLES DRAWN AEROBIC AND ANAEROBIC Blood Culture adequate volume   Culture   Final    NO GROWTH 5 DAYS Performed at [facility], [address] [place],  [address]    Report Status 09/18/2021 FINAL  Final    Labs: CBC: Recent Labs  Lab 09/13/21 1122 09/14/21 0505 09/16/21 0232 09/17/21 0654 09/18/21 0346  WBC 10.9* 7.2 11.6* 11.0* 11.0*  NEUTROABS  --  5.2  --   --   --   HGB 13.2 12.8* 11.3* 11.0* 10.7*  HCT 40.4 39.2 33.9* 32.5* 31.9*  MCV 103.9* 102.1* 101.2* 100.9* 100.6*  PLT 113* 110* 115* 117* 334*   Basic Metabolic Panel: Recent Labs  Lab 09/14/21 0505 09/15/21 0602 09/16/21 0232 09/17/21 0654 09/18/21 0346  NA 136 133* 135 136 137  K 4.1 4.2 4.4 4.5 4.6  CL 100 100 101 103 104  CO2 _0 GLUCOSE 182* 223* 227* 254* 263*  BUN 24* 30* 30* 23 24*  CREATININE 0.88 0.89 0.89 0.73 0.85  CALCIUM 8.0* 8.0* 8.0* 8.4* 8.2*   Liver Function Tests: Recent Labs  Lab 09/13/21 1122 09/14/21 0505 09/15/21 0602 09/16/21 0232  AST _1 ALT 27 30 34 35  ALKPHOS 73 68 59 63  BILITOT 1.8* 1.6* 1.2 1.5*  PROT 6.9 6.2* 5.7* 5.6*  ALBUMIN 3.8 3.4* 3.1* 3.0*   CBG: Recent Labs  Lab 09/18/21 1558 09/18/21 2035 09/18/21 2251 09/19/21 0838 09/19/21 1243  GLUCAP 182* 252* 241* 252* 284*    Discharge time spent: greater than 30 minutes.  Signed: Dwyane Dee, MD [facility] 09/19/2021

## 2021-09-19 NOTE — TOC Progression Note (Signed)
Transition of Care (TOC) - Progression Note  ? ? ?Patient Details  ?Name: [name] ?MRN: [id number] ?Date of Birth: [birth year] ? ?Transition of Care (TOC) CM/SW Contact  ?Conception Oms, RN ?Phone Number: ?09/19/2021, 9:50 AM ? ?Clinical Narrative:   Reached out to Tammy at [facility] and inquired about Ins auth, still pending ? ? ? ?Expected Discharge Plan: [place] ?Barriers to Discharge: Continued Medical Work up ? ?Expected Discharge Plan and Services ?Expected Discharge Plan: [place] ?  ?  ?  ?  ?                ?  ?  ?  ?  ?  ?  ?  ?  ?  ?  ? ? ?Social Determinants of Health (SDOH) Interventions ?  ? ?Readmission Risk Interventions ?No flowsheet data found. ? ?

## 2021-09-19 NOTE — TOC Progression Note (Signed)
Transition of Care (TOC) - Progression Note  ? ? ?Patient Details  ?Name: [name] ?MRN: [id number] ?Date of Birth: 1946-06-09 ? ?Transition of Care (TOC) CM/SW Contact  ?Conception Oms, RN ?Phone Number: ?09/19/2021, 2:30 PM ? ?Clinical Narrative:   The patient needs EMS transport due to continuos Oxygen, EMS called to go to [facility] room 612B, Bedside nurse to call report, 2nd in list for EMS, Called his wife [name] and let her know ? ? ? ?Expected Discharge Plan: [place] ?Barriers to Discharge: Continued Medical Work up ? ?Expected Discharge Plan and Services ?Expected Discharge Plan: [place] ?  ?  ?  ?  ?Expected Discharge Date: 09/19/21               ?  ?  ?  ?  ?  ?  ?  ?  ?  ?  ? ? ?Social Determinants of Health (SDOH) Interventions ?  ? ?Readmission Risk Interventions ?No flowsheet data found. ? ?

## 2021-09-20 ENCOUNTER — Other Ambulatory Visit (HOSPITAL_COMMUNITY): Payer: Self-pay

## 2021-09-21 LAB — IMMUNOGLOBULINS A/E/G/M, SERUM
IgA: 27 mg/dL — ABNORMAL LOW (ref 61–437)
IgE (Immunoglobulin E), Serum: 43 [IU]/mL (ref 6–495)
IgG (Immunoglobin G), Serum: 487 mg/dL — ABNORMAL LOW (ref 603–1613)
IgM (Immunoglobulin M), Srm: 20 mg/dL (ref 15–143)

## 2021-09-25 ENCOUNTER — Inpatient Hospital Stay: Payer: Medicare HMO | Admitting: Family Medicine

## 2021-09-26 ENCOUNTER — Ambulatory Visit: Payer: Medicare HMO | Admitting: Internal Medicine

## 2021-09-26 ENCOUNTER — Other Ambulatory Visit: Payer: Medicare HMO

## 2021-10-01 ENCOUNTER — Encounter: Payer: Medicare HMO | Admitting: Internal Medicine

## 2021-10-01 ENCOUNTER — Other Ambulatory Visit (HOSPITAL_COMMUNITY): Payer: Self-pay

## 2021-10-03 ENCOUNTER — Telehealth: Payer: Self-pay | Admitting: Family Medicine

## 2021-10-03 ENCOUNTER — Telehealth: Payer: Self-pay

## 2021-10-03 NOTE — Telephone Encounter (Signed)
Patient wife called to let us know that has been in and out of the hospital and that is why he has not been transmitting. Patient will be back home tomorrow and will send a transmission then  ?

## 2021-10-03 NOTE — Telephone Encounter (Signed)
[name] from [facility] called wanting to know if you will sign orders for PT and OT and follow his care when he gets home from [facility] skilled [facility]? Please advise and Thank you! ?

## 2021-10-15 ENCOUNTER — Other Ambulatory Visit (HOSPITAL_COMMUNITY): Payer: Self-pay

## 2021-10-17 ENCOUNTER — Telehealth: Payer: Self-pay | Admitting: Family Medicine

## 2021-10-17 ENCOUNTER — Ambulatory Visit: Payer: Medicare HMO

## 2021-10-17 NOTE — Telephone Encounter (Signed)
After the call from home health I called and spoke with the patient and his speech is slurred and he stated his legs feel like rubber/ patient took his BP at home this morning it was 151/51 and this afternoon he took it and it was 150/49 , he refused to go to the ER but I did talk him into going to [facility] urgent [facility] across the street at 3:30 today and I encouraged him to keep his appointment for tomorrow with the provider here for a follow up and he agreed.  Minervia Osso,cma  ?

## 2021-10-17 NOTE — Telephone Encounter (Signed)
[name] from [facility] home [facility] stated yesterday pt was fine but today he is weaker in walking, slur speech and unstable when walking around  ?

## 2021-10-17 NOTE — Telephone Encounter (Signed)
Noted  

## 2021-10-18 ENCOUNTER — Other Ambulatory Visit: Payer: Self-pay

## 2021-10-18 ENCOUNTER — Encounter: Payer: Self-pay | Admitting: Family Medicine

## 2021-10-18 ENCOUNTER — Emergency Department: Payer: Medicare HMO

## 2021-10-18 ENCOUNTER — Inpatient Hospital Stay
Admission: EM | Admit: 2021-10-18 | Discharge: 2021-10-23 | DRG: 065 | Disposition: A | Discharge status: Rehabilitation Facility | Payer: Medicare HMO | Source: Ambulatory Visit | Attending: Internal Medicine | Admitting: Internal Medicine

## 2021-10-18 ENCOUNTER — Inpatient Hospital Stay: Payer: Medicare HMO

## 2021-10-18 ENCOUNTER — Ambulatory Visit (INDEPENDENT_AMBULATORY_CARE_PROVIDER_SITE_OTHER): Payer: Medicare HMO | Admitting: Family Medicine

## 2021-10-18 VITALS — BP 110/60 | HR 85 | Temp 98.9°F | Ht 69.0 in | Wt 212.8 lb

## 2021-10-18 DIAGNOSIS — Z66 Do not resuscitate: Secondary | ICD-10-CM | POA: Diagnosis present

## 2021-10-18 DIAGNOSIS — K219 Gastro-esophageal reflux disease without esophagitis: Secondary | ICD-10-CM | POA: Diagnosis present

## 2021-10-18 DIAGNOSIS — C9110 Chronic lymphocytic leukemia of B-cell type not having achieved remission: Secondary | ICD-10-CM | POA: Diagnosis present

## 2021-10-18 DIAGNOSIS — I6389 Other cerebral infarction: Secondary | ICD-10-CM | POA: Diagnosis not present

## 2021-10-18 DIAGNOSIS — E7800 Pure hypercholesterolemia, unspecified: Secondary | ICD-10-CM | POA: Diagnosis present

## 2021-10-18 DIAGNOSIS — G8194 Hemiplegia, unspecified affecting left nondominant side: Secondary | ICD-10-CM | POA: Diagnosis present

## 2021-10-18 DIAGNOSIS — J432 Centrilobular emphysema: Secondary | ICD-10-CM | POA: Diagnosis not present

## 2021-10-18 DIAGNOSIS — U099 Post covid-19 condition, unspecified: Secondary | ICD-10-CM | POA: Diagnosis present

## 2021-10-18 DIAGNOSIS — G893 Neoplasm related pain (acute) (chronic): Secondary | ICD-10-CM | POA: Diagnosis present

## 2021-10-18 DIAGNOSIS — M1990 Unspecified osteoarthritis, unspecified site: Secondary | ICD-10-CM | POA: Diagnosis present

## 2021-10-18 DIAGNOSIS — I480 Paroxysmal atrial fibrillation: Secondary | ICD-10-CM | POA: Diagnosis not present

## 2021-10-18 DIAGNOSIS — I10 Essential (primary) hypertension: Secondary | ICD-10-CM | POA: Diagnosis not present

## 2021-10-18 DIAGNOSIS — R29704 NIHSS score 4: Secondary | ICD-10-CM | POA: Diagnosis present

## 2021-10-18 DIAGNOSIS — J449 Chronic obstructive pulmonary disease, unspecified: Secondary | ICD-10-CM | POA: Diagnosis present

## 2021-10-18 DIAGNOSIS — Z8673 Personal history of transient ischemic attack (TIA), and cerebral infarction without residual deficits: Secondary | ICD-10-CM

## 2021-10-18 DIAGNOSIS — M4712 Other spondylosis with myelopathy, cervical region: Secondary | ICD-10-CM | POA: Diagnosis not present

## 2021-10-18 DIAGNOSIS — I2510 Atherosclerotic heart disease of native coronary artery without angina pectoris: Secondary | ICD-10-CM | POA: Diagnosis present

## 2021-10-18 DIAGNOSIS — Z87891 Personal history of nicotine dependence: Secondary | ICD-10-CM

## 2021-10-18 DIAGNOSIS — I252 Old myocardial infarction: Secondary | ICD-10-CM

## 2021-10-18 DIAGNOSIS — R531 Weakness: Secondary | ICD-10-CM

## 2021-10-18 DIAGNOSIS — F4310 Post-traumatic stress disorder, unspecified: Secondary | ICD-10-CM | POA: Diagnosis present

## 2021-10-18 DIAGNOSIS — I639 Cerebral infarction, unspecified: Secondary | ICD-10-CM

## 2021-10-18 DIAGNOSIS — R079 Chest pain, unspecified: Secondary | ICD-10-CM | POA: Diagnosis not present

## 2021-10-18 DIAGNOSIS — Z96643 Presence of artificial hip joint, bilateral: Secondary | ICD-10-CM | POA: Diagnosis present

## 2021-10-18 DIAGNOSIS — I110 Hypertensive heart disease with heart failure: Secondary | ICD-10-CM | POA: Diagnosis present

## 2021-10-18 DIAGNOSIS — I440 Atrioventricular block, first degree: Secondary | ICD-10-CM | POA: Diagnosis present

## 2021-10-18 DIAGNOSIS — J9601 Acute respiratory failure with hypoxia: Secondary | ICD-10-CM

## 2021-10-18 DIAGNOSIS — Z8249 Family history of ischemic heart disease and other diseases of the circulatory system: Secondary | ICD-10-CM

## 2021-10-18 DIAGNOSIS — R4781 Slurred speech: Secondary | ICD-10-CM | POA: Diagnosis present

## 2021-10-18 DIAGNOSIS — I5032 Chronic diastolic (congestive) heart failure: Secondary | ICD-10-CM | POA: Diagnosis present

## 2021-10-18 DIAGNOSIS — H9190 Unspecified hearing loss, unspecified ear: Secondary | ICD-10-CM | POA: Diagnosis present

## 2021-10-18 DIAGNOSIS — I25118 Atherosclerotic heart disease of native coronary artery with other forms of angina pectoris: Secondary | ICD-10-CM | POA: Diagnosis not present

## 2021-10-18 DIAGNOSIS — Z7901 Long term (current) use of anticoagulants: Secondary | ICD-10-CM

## 2021-10-18 DIAGNOSIS — I495 Sick sinus syndrome: Secondary | ICD-10-CM | POA: Diagnosis present

## 2021-10-18 DIAGNOSIS — Z951 Presence of aortocoronary bypass graft: Secondary | ICD-10-CM

## 2021-10-18 DIAGNOSIS — Z974 Presence of external hearing-aid: Secondary | ICD-10-CM

## 2021-10-18 DIAGNOSIS — Z950 Presence of cardiac pacemaker: Secondary | ICD-10-CM | POA: Diagnosis not present

## 2021-10-18 DIAGNOSIS — G8114 Spastic hemiplegia affecting left nondominant side: Secondary | ICD-10-CM | POA: Diagnosis not present

## 2021-10-18 DIAGNOSIS — I482 Chronic atrial fibrillation, unspecified: Secondary | ICD-10-CM | POA: Diagnosis not present

## 2021-10-18 DIAGNOSIS — I429 Cardiomyopathy, unspecified: Secondary | ICD-10-CM | POA: Diagnosis present

## 2021-10-18 DIAGNOSIS — R471 Dysarthria and anarthria: Secondary | ICD-10-CM | POA: Diagnosis present

## 2021-10-18 DIAGNOSIS — G4733 Obstructive sleep apnea (adult) (pediatric): Secondary | ICD-10-CM | POA: Diagnosis present

## 2021-10-18 DIAGNOSIS — R252 Cramp and spasm: Secondary | ICD-10-CM | POA: Diagnosis not present

## 2021-10-18 DIAGNOSIS — Z9221 Personal history of antineoplastic chemotherapy: Secondary | ICD-10-CM

## 2021-10-18 HISTORY — DX: Cerebral infarction, unspecified: I63.9

## 2021-10-18 LAB — CBC
HCT: 33.3 % — ABNORMAL LOW (ref 39.0–52.0)
Hemoglobin: 10.5 g/dL — ABNORMAL LOW (ref 13.0–17.0)
MCH: 33.1 pg (ref 26.0–34.0)
MCHC: 31.5 g/dL (ref 30.0–36.0)
MCV: 105 fL — ABNORMAL HIGH (ref 80.0–100.0)
Platelets: 264 10*3/uL (ref 150–400)
RBC: 3.17 MIL/uL — ABNORMAL LOW (ref 4.22–5.81)
RDW: 16.9 % — ABNORMAL HIGH (ref 11.5–15.5)
WBC: 9.5 10*3/uL (ref 4.0–10.5)
nRBC: 0 % (ref 0.0–0.2)

## 2021-10-18 LAB — DIFFERENTIAL
Abs Immature Granulocytes: 0.16 10*3/uL — ABNORMAL HIGH (ref 0.00–0.07)
Basophils Absolute: 0 10*3/uL (ref 0.0–0.1)
Basophils Relative: 0 %
Eosinophils Absolute: 0.1 10*3/uL (ref 0.0–0.5)
Eosinophils Relative: 1 %
Immature Granulocytes: 2 %
Lymphocytes Relative: 20 %
Lymphs Abs: 1.9 10*3/uL (ref 0.7–4.0)
Monocytes Absolute: 1 10*3/uL (ref 0.1–1.0)
Monocytes Relative: 11 %
Neutro Abs: 6.4 10*3/uL (ref 1.7–7.7)
Neutrophils Relative %: 66 %

## 2021-10-18 LAB — COMPREHENSIVE METABOLIC PANEL
ALT: 38 U/L (ref 0–44)
AST: 28 U/L (ref 15–41)
Albumin: 3.4 g/dL — ABNORMAL LOW (ref 3.5–5.0)
Alkaline Phosphatase: 114 U/L (ref 38–126)
Anion gap: 9 (ref 5–15)
BUN: 16 mg/dL (ref 8–23)
CO2: 29 mmol/L (ref 22–32)
Calcium: 8.3 mg/dL — ABNORMAL LOW (ref 8.9–10.3)
Chloride: 99 mmol/L (ref 98–111)
Creatinine, Ser: 1.1 mg/dL (ref 0.61–1.24)
GFR, Estimated: >60 mL/min (ref >60)
Glucose, Bld: 120 mg/dL — ABNORMAL HIGH (ref 70–99)
Potassium: 3.4 mmol/L — ABNORMAL LOW (ref 3.5–5.1)
Sodium: 137 mmol/L (ref 135–145)
Total Bilirubin: 1.1 mg/dL (ref 0.3–1.2)
Total Protein: 6.8 g/dL (ref 6.5–8.1)

## 2021-10-18 LAB — PROTIME-INR
INR: 1.6 — ABNORMAL HIGH (ref 0.8–1.2)
Prothrombin Time: 18.6 seconds — ABNORMAL HIGH (ref 11.4–15.2)

## 2021-10-18 LAB — APTT: aPTT: 40 seconds — ABNORMAL HIGH (ref 24–36)

## 2021-10-18 IMAGING — CT CT ANGIO HEAD-NECK (W OR W/O PERF)
2 of 7 series · 8 of 33 positions shown · IV contrast (APPLIED)
Comparison: CTA head neck [DATE], correlation is also made with
CT head [DATE]

CLINICAL DATA: Stroke suspected

EXAM:
CT ANGIOGRAPHY HEAD AND NECK
TECHNIQUE: Multidetector CT imaging of the head and neck was performed using
the standard protocol during bolus administration of intravenous
contrast. Multiplanar CT image reconstructions and MIPs were
obtained to evaluate the vascular anatomy. Carotid stenosis
measurements (when applicable) are obtained utilizing NASCET
criteria, using the distal internal carotid diameter as the
denominator.

[Series 8: cta neck/head · axial · 0.49mm/px · z∈[-266,-134]mm · 2 of 199 slices shown]
[im 67/199  soft-tissue]
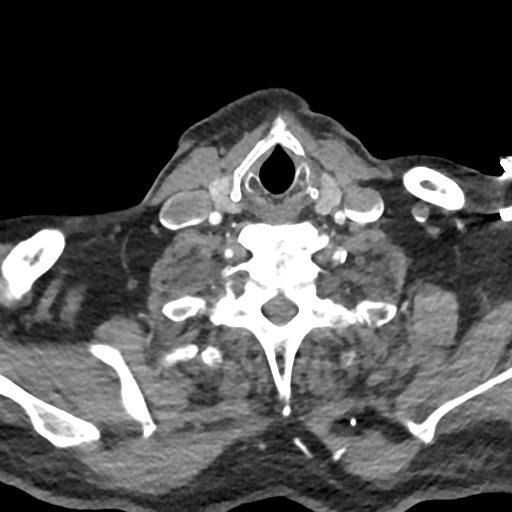
[im 133/199  soft-tissue]
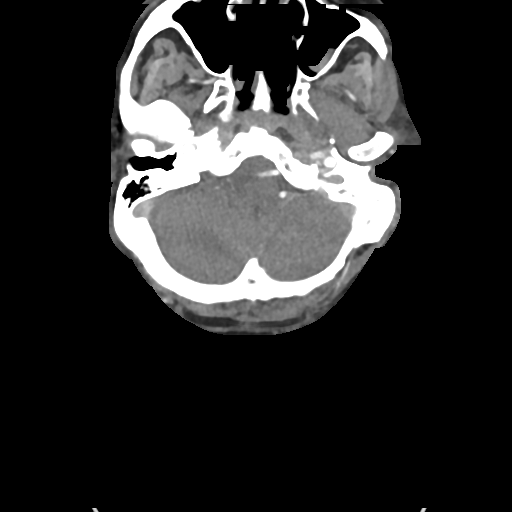

[Series 10: ax thins · axial · 0.39mm/px · z∈[-333,-56]mm · 6 of 389 slices shown]
[im 56/389  soft-tissue]
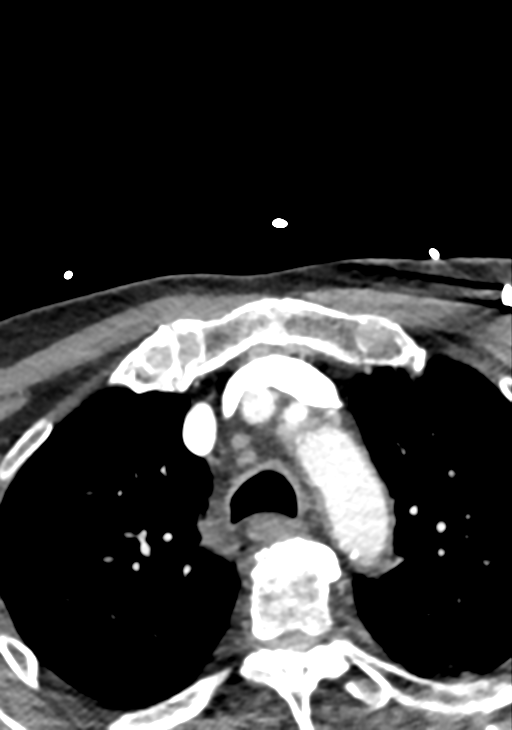
[im 111/389  bone]
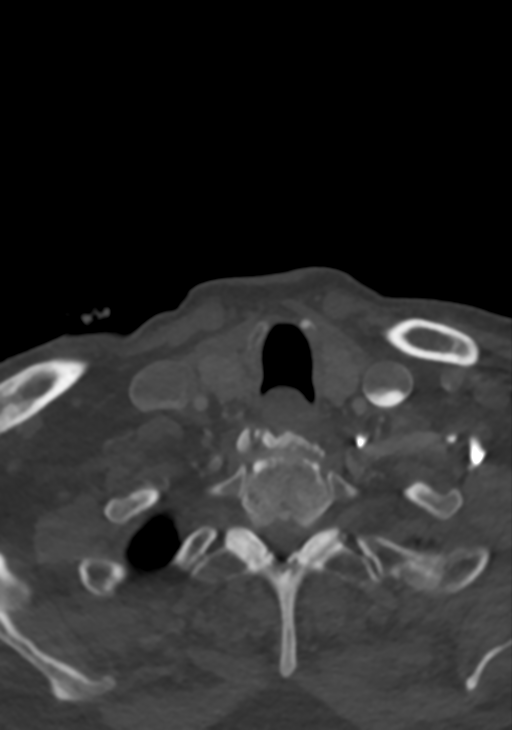
[im 167/389  soft-tissue]
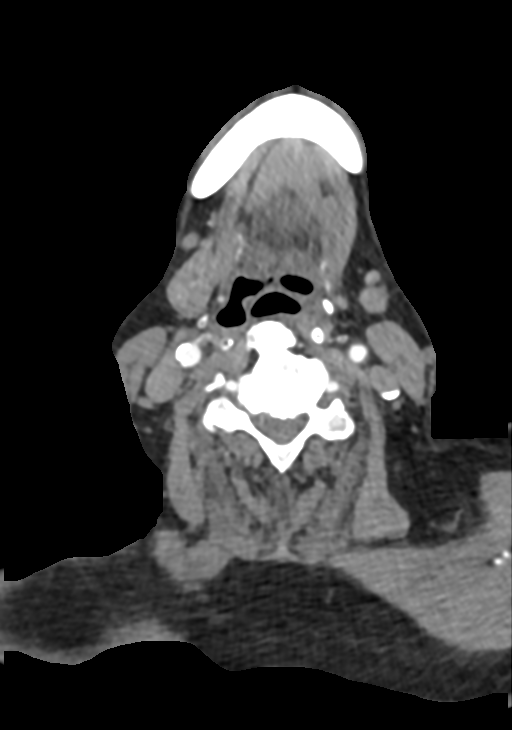
[im 222/389  bone]
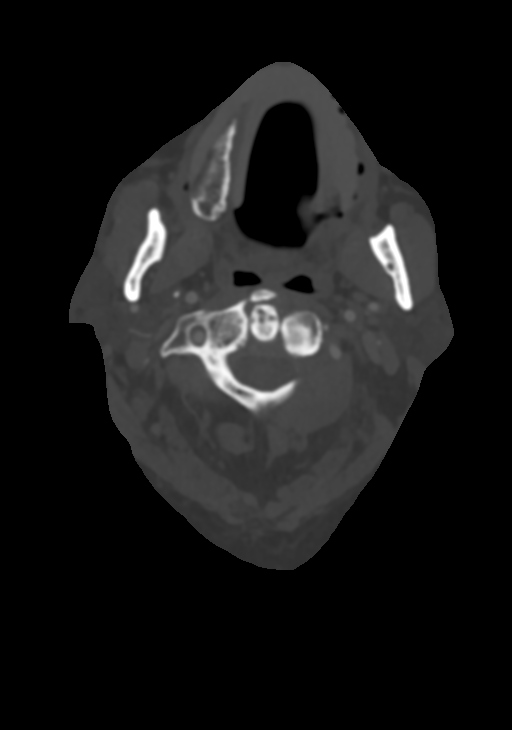
[im 278/389  soft-tissue]
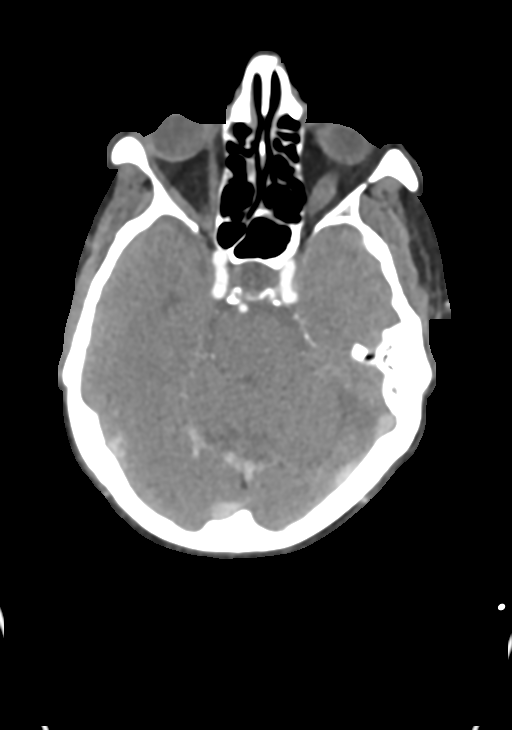
[im 333/389  bone]
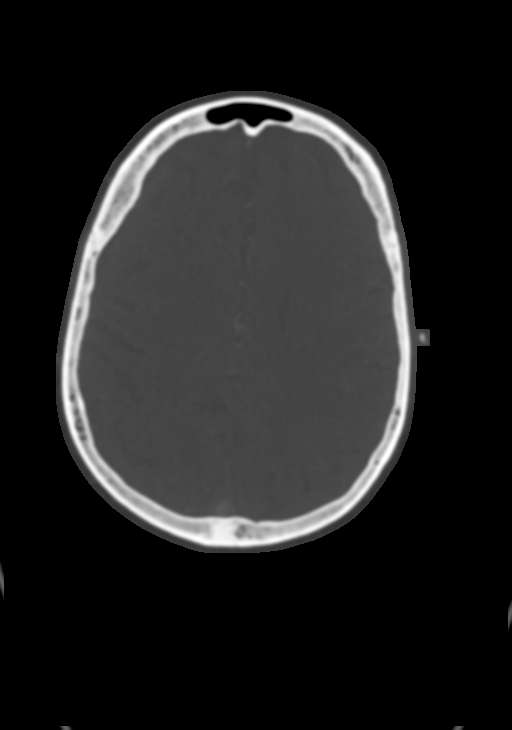

[8 of 33 positions shown; findings below may reference images not displayed]

RADIATION DOSE REDUCTION: This exam was performed according to the
departmental dose-optimization program which includes automated
exposure control, adjustment of the mA and/or kV according to
patient size and/or use of iterative reconstruction technique.

CONTRAST:  75mL OMNIPAQUE IOHEXOL 350 MG/ML SOLN
FINDINGS: CT HEAD FINDINGS

For noncontrast findings, please see same day CT head.

CTA NECK FINDINGS

Aortic arch: Standard branching. Imaged portion shows no evidence of
aneurysm or dissection. No significant stenosis of the major arch
vessel origins. Aortic atherosclerosis

Right carotid system: No evidence of dissection, stenosis (50% or
greater) or occlusion. Calcified and noncalcified plaque at the
bifurcation and in the proximal right ICA is not hemodynamically
significant.

Left carotid system: No evidence of dissection, stenosis (50% or
greater) or occlusion. Calcified and noncalcified plaque at the
bifurcation and in the proximal left ICA is not hemodynamically
significant.

Vertebral arteries: Moderate to severe narrowing at the origin of
the left vertebral artery, secondary to noncalcified plaque. The
left vertebral artery is otherwise patent the skull base, without
dissection or occlusion. The right vertebral artery is patent from
its origin to the skull base. Right dominant system.

Skeleton: Degenerative changes in the cervical spine, with large
anterior osteophytes C3-C6, and osseous fusion across C4-C6. Status
post median sternotomy. No acute osseous abnormality.

Other neck: Negative.

Upper chest: No focal pulmonary opacity or pleural effusion.

Review of the MIP images confirms the above findings

CTA HEAD FINDINGS

Anterior circulation: Both internal carotid arteries are patent to
the termini, mild calcifications but without significant stenosis.

A1 segments patent. Normal anterior communicating artery. Anterior
cerebral arteries are patent to their distal aspects without focal
stenosis.

No M1 stenosis or occlusion. Normal MCA bifurcations. Distal MCA
branches perfused, without focal stenosis.

Posterior circulation: Vertebral arteries patent to the
vertebrobasilar junction without stenosis. Posterior inferior
cerebral arteries patent bilaterally.

Basilar patent to its distal aspect. Superior cerebellar arteries
patent bilaterally.

Patent P1 segments. PCAs perfused to their distal aspects without
focal stenosis. The bilateral posterior communicating arteries are
not visualized.

Venous sinuses: As permitted by contrast timing, patent.

Anatomic variants: None significant.

Review of the MIP images confirms the above findings
IMPRESSION: 1.  No intracranial large vessel occlusion or significant stenosis.
2. Moderate stenosis of the origin of the left vertebral artery.
Otherwise no hemodynamically significant stenosis in the neck.

## 2021-10-18 IMAGING — CR DG CHEST 2V
2 series · 2 of 2 positions shown · non-contrast
Comparison: Chest x-ray [DATE]

CLINICAL DATA: Shortness of breath

EXAM:
CHEST - 2 VIEW

[chest lat]
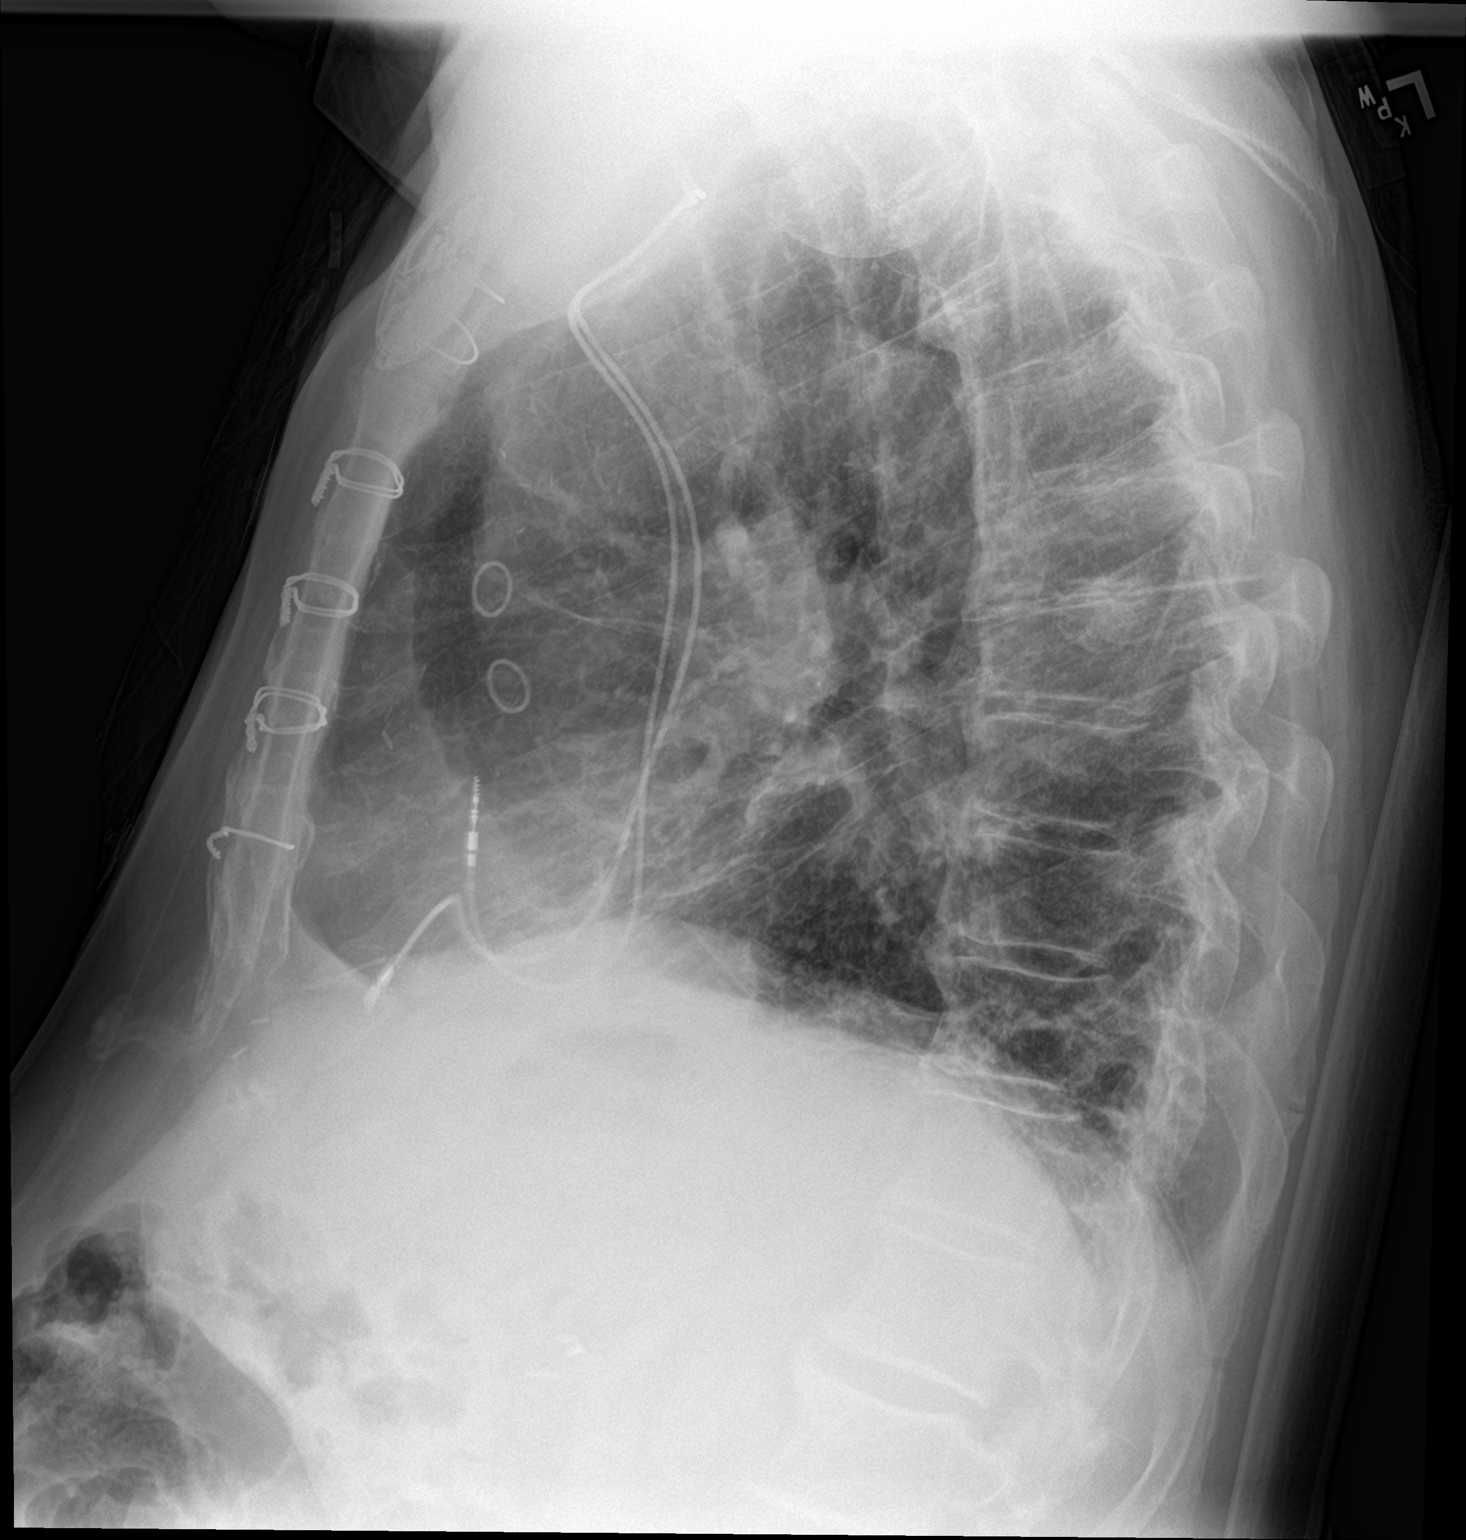

[chest ap]
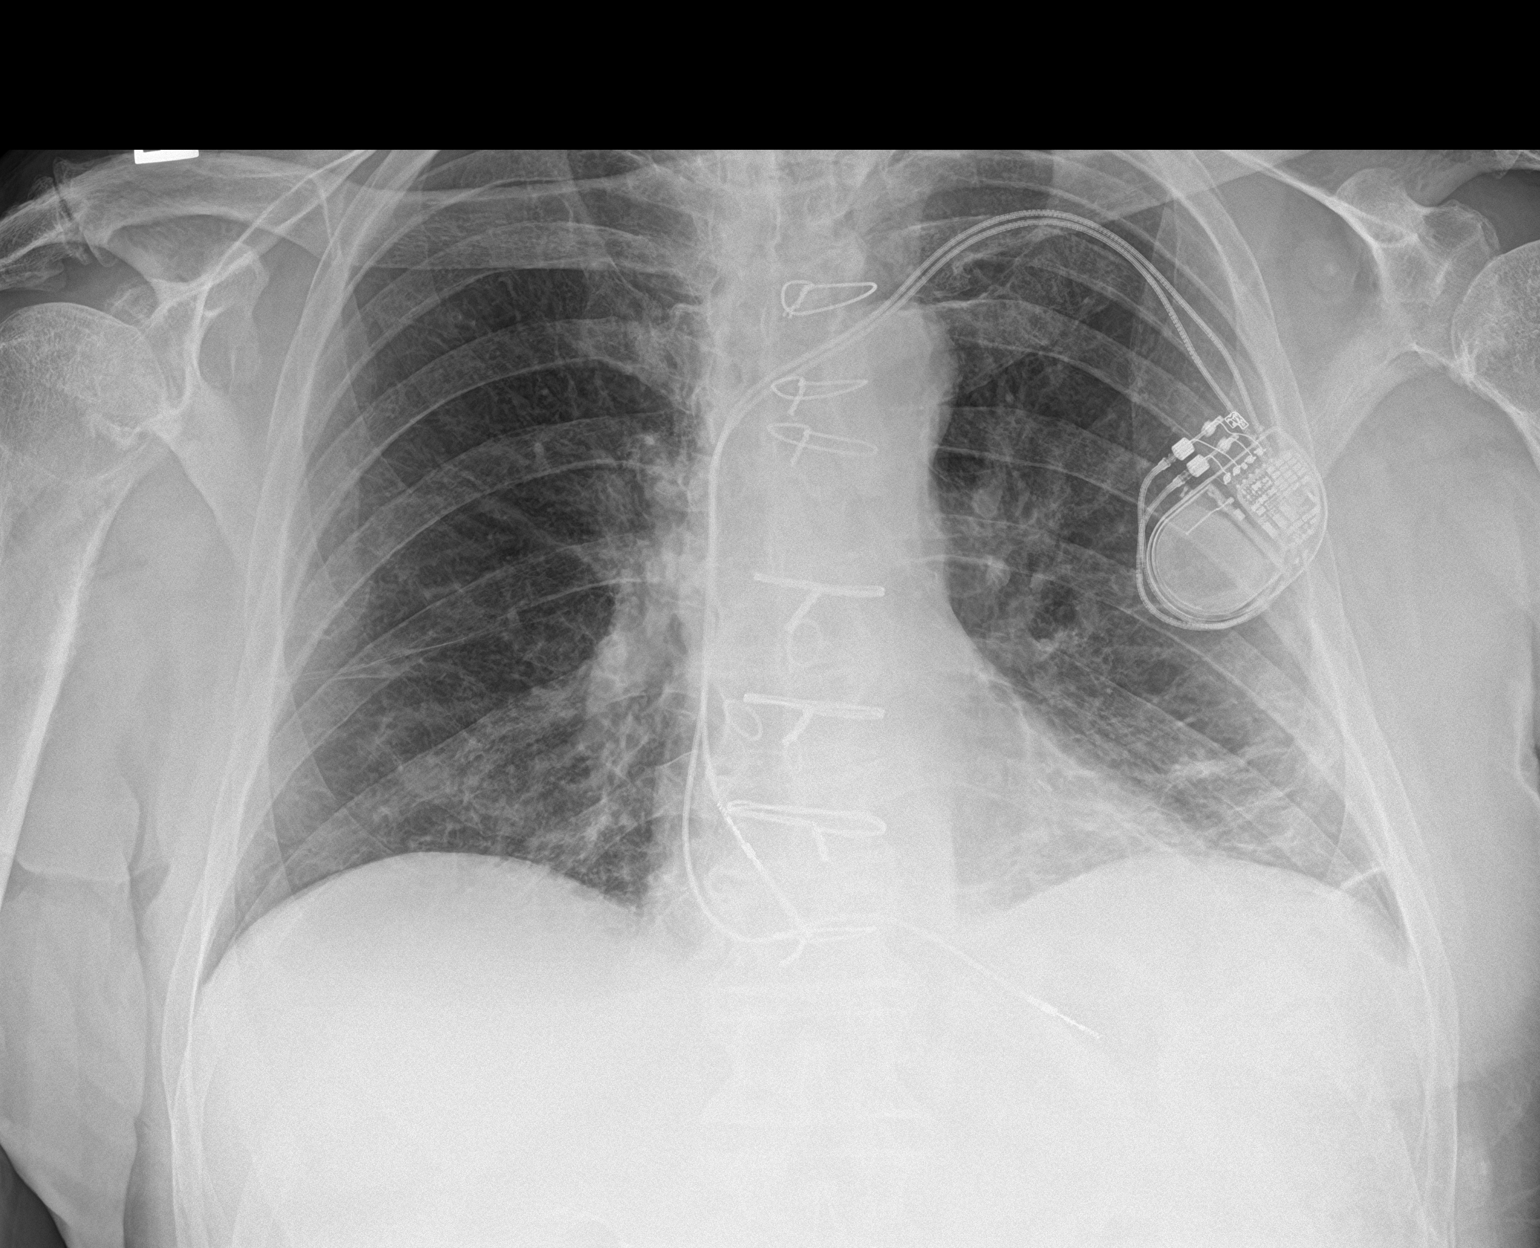

[2 of 2 positions shown; findings below may reference images not displayed]

FINDINGS: Heart size and mediastinum are stable and within normal limits.
Cardiac surgical changes and median sternotomy wires. Left-sided
cardiac device. Linear opacities again seen in the left lower lung
zone likely representing scarring/chronic subsegmental atelectasis.
No new consolidation identified. No pleural effusion or
pneumothorax.
IMPRESSION: Stable appearing chronic changes with no acute process identified.

## 2021-10-18 IMAGING — CT CT HEAD W/O CM
4 series · 16 of 47 positions shown, 18 images · non-contrast
Comparison: [DATE]

CLINICAL DATA: Shortness of breath, generalized weakness



[Series 2: head wo · axial · 0.45mm/px · z∈[-98,+16]mm · 7 of 31 slices shown, 9 images]
[im 4/31  brain]
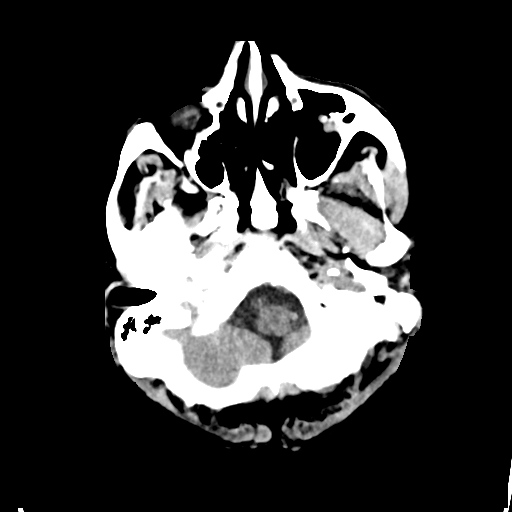
[im 4/31  bone]
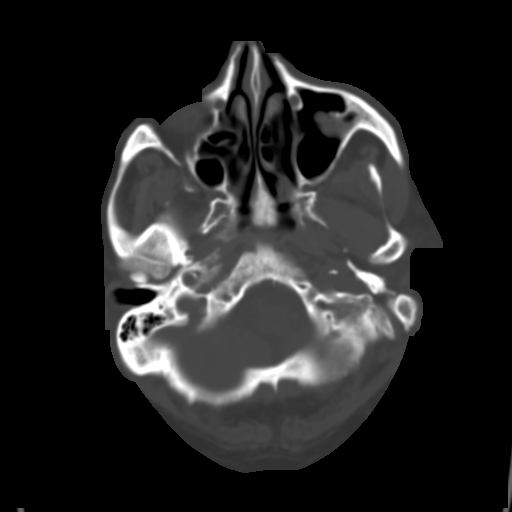
[im 8/31  brain]
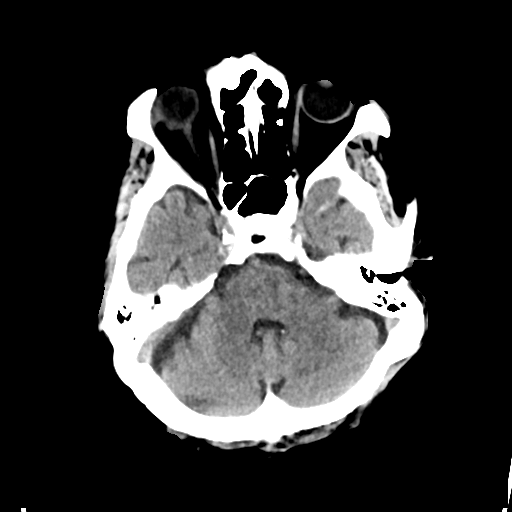
[im 12/31  brain]
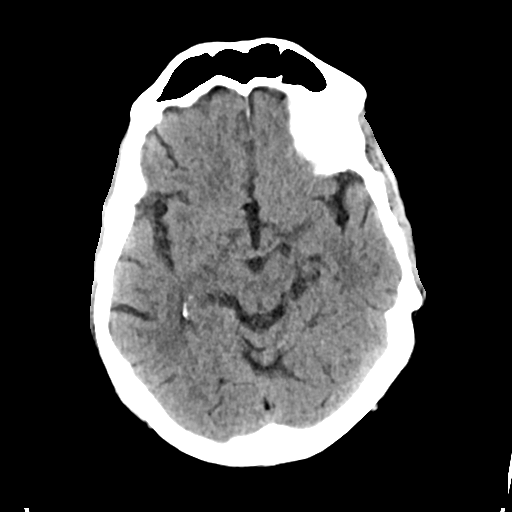
[im 16/31  brain]
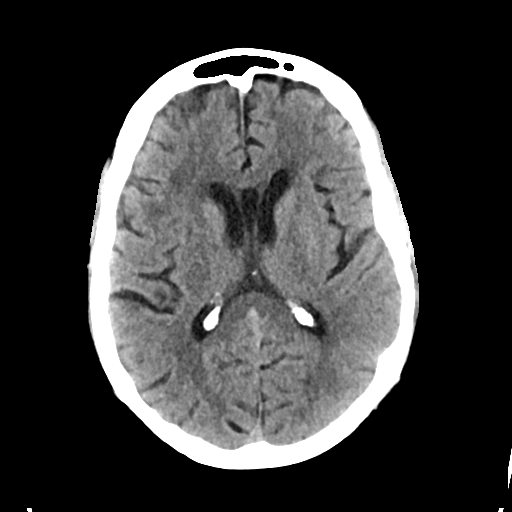
[im 19/31  brain]
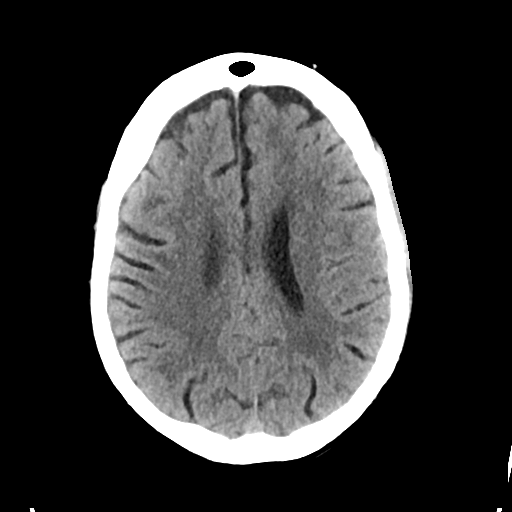
[im 19/31  bone]
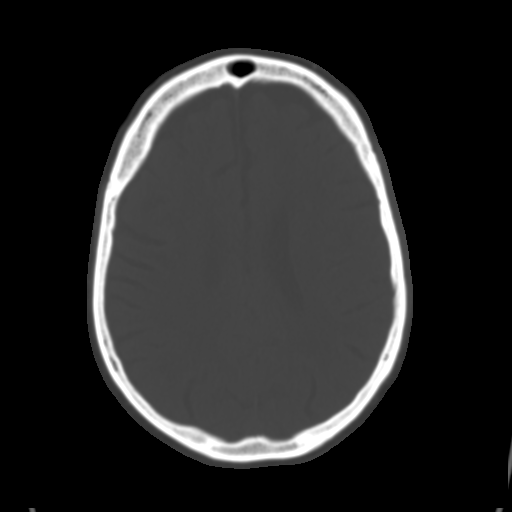
[im 23/31  brain]
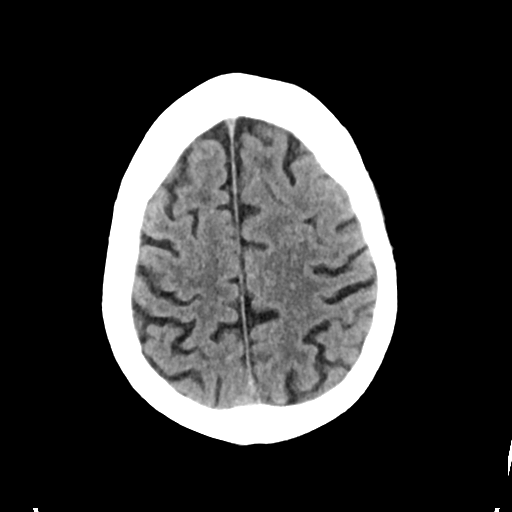
[im 27/31  brain]
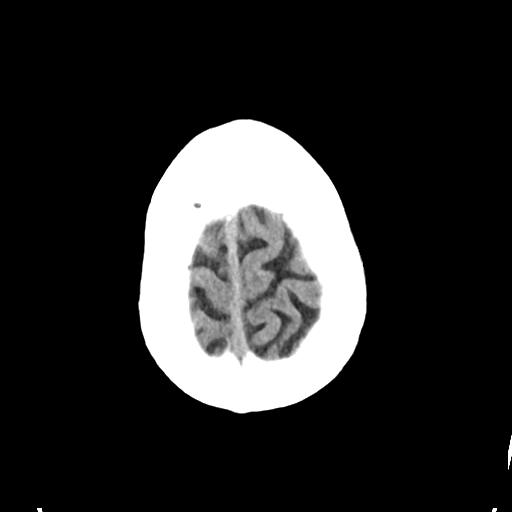

[Series 3: head bone · axial · 0.45mm/px · z∈[-100,-68]mm · 3 of 78 slices shown]
[im 8/78  bone]
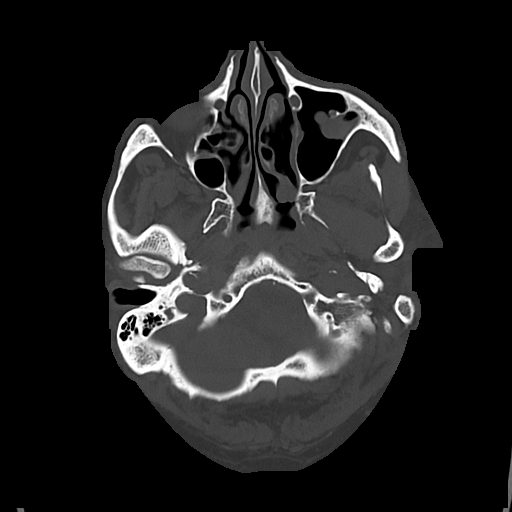
[im 16/78  bone]
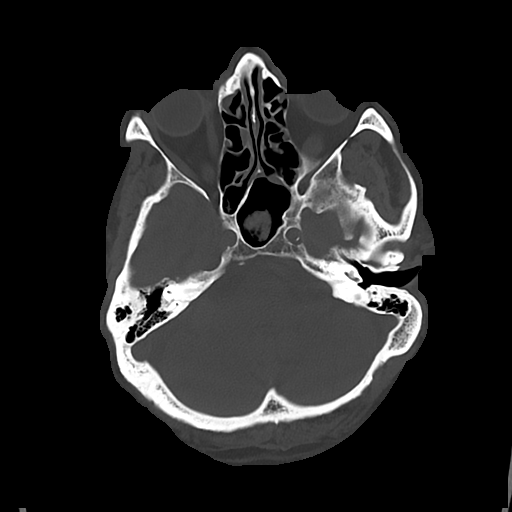
[im 24/78  bone]
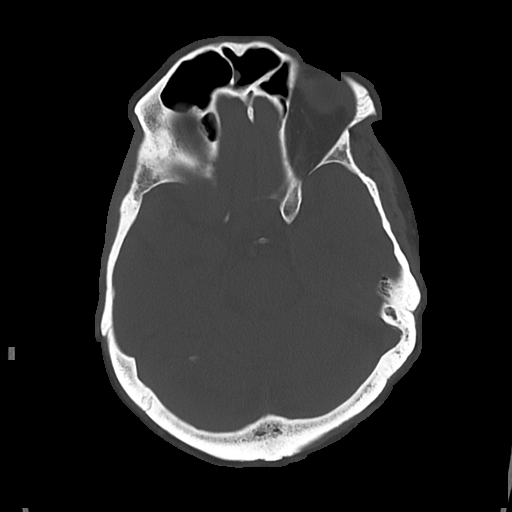

[Series 4: cor soft · coronal · 0.36mm/px · 3 of 70 slices shown]
[im 24/70  brain]
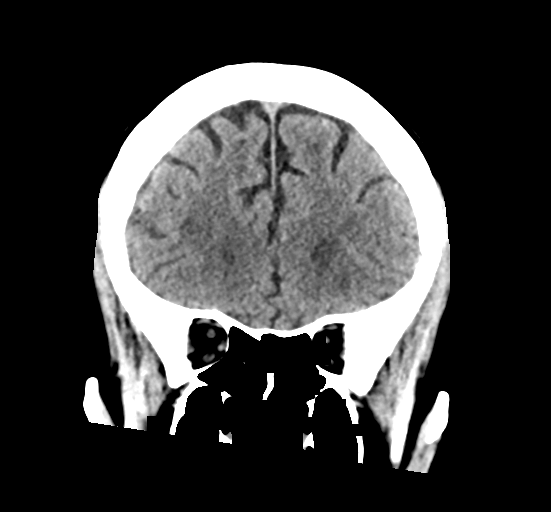
[im 31/70  brain]
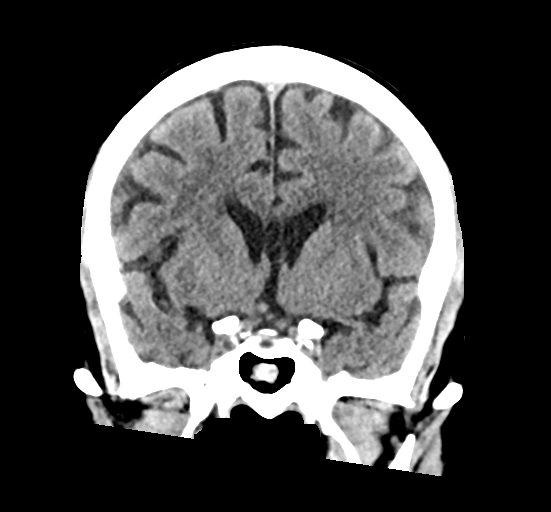
[im 39/70  brain]
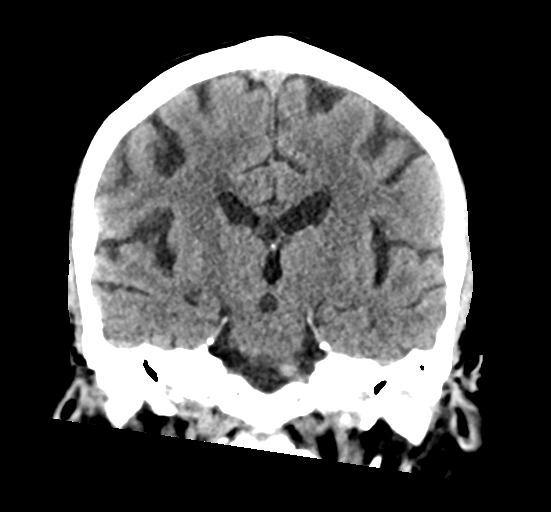

[Series 5: sag soft · sagittal · 0.36mm/px · 3 of 57 slices shown]
[im 21/57  brain]
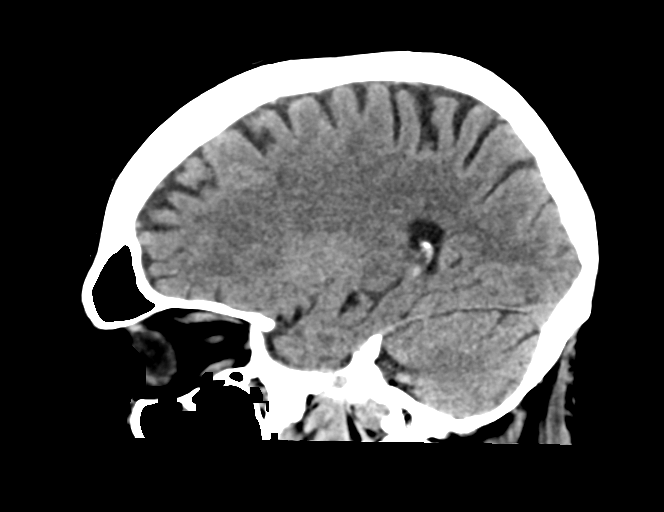
[im 29/57  brain]
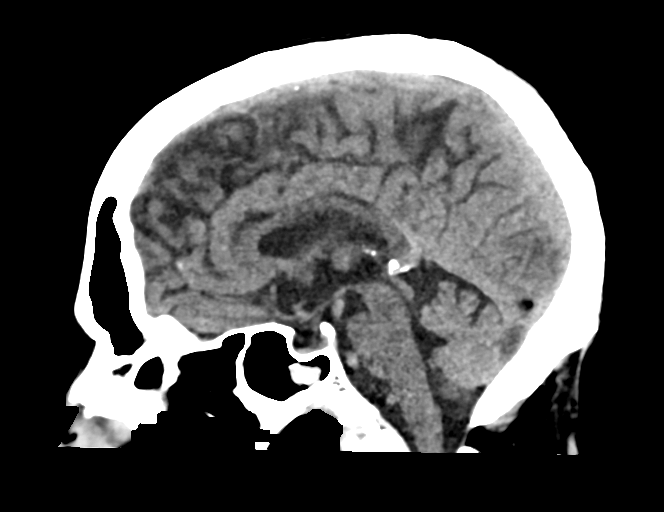
[im 36/57  brain]
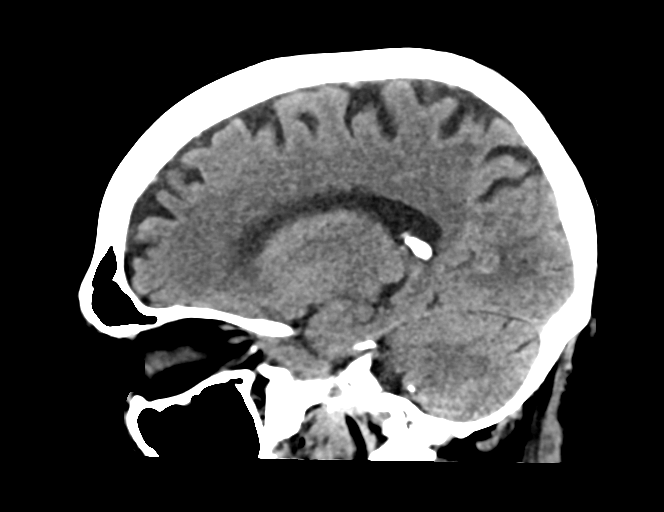

[16 of 47 positions shown; findings below may reference images not displayed]

FINDINGS: Brain: No acute intracranial findings are seen. Ventricles are not
dilated. Cavum septum pellucidum and cavum septum vergae are seen.
There is no shift of midline structures. Cortical sulci are
prominent.

Vascular: There are scattered arterial calcifications.

Skull: Unremarkable.

Sinuses/Orbits: There is mucosal thickening in the ethmoid,
maxillary and sphenoid sinuses.

Other: None
IMPRESSION: No acute intracranial findings are seen in noncontrast CT brain.
Atrophy.

Chronic sinusitis.

## 2021-10-18 MED ORDER — ENOXAPARIN SODIUM 60 MG/0.6ML IJ SOSY
0.5000 mg/kg | PREFILLED_SYRINGE | INTRAMUSCULAR | Status: DC
  Administered 2021-10-19 – 2021-10-21 (×3): 47.5 mg via SUBCUTANEOUS
  Filled 2021-10-18 (×3): qty 0.6

## 2021-10-18 MED ORDER — ASPIRIN 81 MG PO CHEW
81.0000 mg | CHEWABLE_TABLET | Freq: Every day | ORAL | Status: DC
  Administered 2021-10-19 – 2021-10-22 (×4): 81 mg via ORAL
  Filled 2021-10-18 (×4): qty 1

## 2021-10-18 MED ORDER — VENETOCLAX 100 MG PO TABS: 100.0000 mg | ORAL_TABLET | Freq: Every day | ORAL | Status: DC

## 2021-10-18 MED ORDER — FUROSEMIDE 40 MG PO TABS
40.0000 mg | ORAL_TABLET | Freq: Every day | ORAL | Status: DC
  Administered 2021-10-19 – 2021-10-23 (×5): 40 mg via ORAL
  Filled 2021-10-18 (×5): qty 1

## 2021-10-18 MED ORDER — TIOTROPIUM BROMIDE MONOHYDRATE 2.5 MCG/ACT IN AERS: 2.0000 | INHALATION_SPRAY | Freq: Every day | RESPIRATORY_TRACT | Status: DC

## 2021-10-18 MED ORDER — MIRTAZAPINE 15 MG PO TABS
15.0000 mg | ORAL_TABLET | Freq: Every evening | ORAL | Status: DC | PRN
  Administered 2021-10-20 – 2021-10-22 (×2): 15 mg via ORAL
  Filled 2021-10-18 (×2): qty 1

## 2021-10-18 MED ORDER — STROKE: EARLY STAGES OF RECOVERY BOOK
Freq: Once | Status: AC
  Administered 2021-10-18: 1

## 2021-10-18 MED ORDER — TIOTROPIUM BROMIDE MONOHYDRATE 18 MCG IN CAPS
1.0000 | ORAL_CAPSULE | Freq: Every day | RESPIRATORY_TRACT | Status: DC
  Filled 2021-10-18: qty 5

## 2021-10-18 MED ORDER — AMIODARONE HCL 200 MG PO TABS
100.0000 mg | ORAL_TABLET | Freq: Two times a day (BID) | ORAL | Status: DC
Start: 2021-10-18 — End: 2021-10-23
  Administered 2021-10-18 – 2021-10-23 (×10): 100 mg via ORAL
  Filled 2021-10-18 (×10): qty 1

## 2021-10-18 MED ORDER — ACYCLOVIR 200 MG PO CAPS
400.0000 mg | ORAL_CAPSULE | Freq: Two times a day (BID) | ORAL | Status: DC
  Administered 2021-10-18 – 2021-10-23 (×10): 400 mg via ORAL
  Filled 2021-10-18 (×10): qty 2

## 2021-10-18 MED ORDER — ATORVASTATIN CALCIUM 20 MG PO TABS
80.0000 mg | ORAL_TABLET | Freq: Every day | ORAL | Status: DC
Start: 2021-10-18 — End: 2021-10-23
  Administered 2021-10-18 – 2021-10-22 (×3): 80 mg via ORAL
  Filled 2021-10-18 (×5): qty 4

## 2021-10-18 MED ORDER — IOHEXOL 350 MG/ML SOLN
75.0000 mL | Freq: Once | INTRAVENOUS | Status: AC | PRN
  Administered 2021-10-18: 75 mL via INTRAVENOUS
  Filled 2021-10-18: qty 75

## 2021-10-18 MED ORDER — ASPIRIN 81 MG PO CHEW
324.0000 mg | CHEWABLE_TABLET | Freq: Once | ORAL | Status: AC
  Administered 2021-10-18: 324 mg via ORAL
  Filled 2021-10-18: qty 4

## 2021-10-18 MED ORDER — EZETIMIBE 10 MG PO TABS
10.0000 mg | ORAL_TABLET | Freq: Every day | ORAL | Status: DC
  Administered 2021-10-19 – 2021-10-23 (×5): 10 mg via ORAL
  Filled 2021-10-18 (×5): qty 1

## 2021-10-18 MED ORDER — MOMETASONE FURO-FORMOTEROL FUM 200-5 MCG/ACT IN AERO: 2.0000 | INHALATION_SPRAY | Freq: Two times a day (BID) | RESPIRATORY_TRACT | Status: DC

## 2021-10-18 MED ORDER — ISOSORBIDE MONONITRATE ER 30 MG PO TB24
30.0000 mg | ORAL_TABLET | Freq: Every day | ORAL | Status: DC
  Administered 2021-10-18 – 2021-10-23 (×6): 30 mg via ORAL
  Filled 2021-10-18 (×6): qty 1

## 2021-10-18 MED ORDER — ALBUTEROL SULFATE (2.5 MG/3ML) 0.083% IN NEBU: 3.0000 mL | INHALATION_SOLUTION | Freq: Four times a day (QID) | RESPIRATORY_TRACT | Status: DC | PRN

## 2021-10-18 MED ORDER — ACETAMINOPHEN 160 MG/5ML PO SOLN
650.0000 mg | ORAL | Status: DC | PRN
  Filled 2021-10-18: qty 20.3

## 2021-10-18 MED ORDER — ACETAMINOPHEN 325 MG PO TABS
650.0000 mg | ORAL_TABLET | ORAL | Status: DC | PRN
  Administered 2021-10-18 – 2021-10-22 (×7): 650 mg via ORAL
  Filled 2021-10-18 (×8): qty 2

## 2021-10-18 MED ORDER — MOMETASONE FURO-FORMOTEROL FUM 200-5 MCG/ACT IN AERO
2.0000 | INHALATION_SPRAY | Freq: Two times a day (BID) | RESPIRATORY_TRACT | Status: DC
Start: 2021-10-19 — End: 2021-10-23
  Administered 2021-10-19 – 2021-10-23 (×9): 2 via RESPIRATORY_TRACT
  Filled 2021-10-18: qty 8.8

## 2021-10-18 MED ORDER — ACETAMINOPHEN 650 MG RE SUPP: 650.0000 mg | RECTAL | Status: DC | PRN

## 2021-10-18 NOTE — Progress Notes (Signed)
PHARMACIST - PHYSICIAN COMMUNICATION ? ?CONCERNING:  Enoxaparin (Lovenox) for DVT Prophylaxis  ? ? ?RECOMMENDATION: ?Patient was prescribed enoxaprin '40mg'$  q24 hours for VTE prophylaxis.  ? Danley Danker Weights  ? 10/18/21 1412  ?Weight: 96.5 kg (212 lb 11.9 oz)  ? ? ?Body mass index is 31.42 kg/m?. ? ?Estimated Creatinine Clearance: 65.5 mL/min (by C-G formula based on SCr of 1.1 mg/dL). ? ? ?Based on [place] patient is candidate for enoxaparin 0.'5mg'$ /kg TBW SQ every 24 hours based on BMI being >30. ? ?DESCRIPTION: ?Pharmacy has adjusted enoxaparin dose per [facility] policy. ? ?Patient is now receiving enoxaparin 47.5 mg every 24 hours  ? ? ?Darrick Penna, PharmD ?Clinical Pharmacist  ?10/18/2021 ?5:01 PM ? ?

## 2021-10-18 NOTE — ED Notes (Signed)
Called dietary for tray.

## 2021-10-18 NOTE — Progress Notes (Signed)
?Michael Rumps, MD ?Phone: [phone] ? ?[name] is a 76 y.o. male who presents today for follow-up. ? ?Left-sided weakness: Patient and his wife note that he has had some left-sided weakness that has been mild recently though yesterday became suddenly worse.  He was not able to get out of bed.  He also developed slurred speech yesterday.  He was advised to be evaluated in the ED yesterday though he refused.  His wife notes his left-sided weakness seems to be a little better today. ? ?History of COVID-19: Patient was hospitalized on 2 occasions earlier this year for COVID-19.  He was given remdesivir during the first hospitalization and treated for COPD exacerbation.  During his second hospitalization he was also treated for a COPD exacerbation.  He has had continued cough and dyspnea.  Notes the dyspnea remains stable.  He notes shortness of breath at rest and during exertion.  He notes his cough is worsened in the last several days.  He notes no fevers. ? ?Social History  ? ?Tobacco Use  ?Smoking Status Former  ? Packs/day: 1.00  ? Years: 40.00  ? Pack years: 40.00  ? Types: Cigarettes  ? Quit date: 07/21/2006  ? Years since quitting: 15.2  ?Smokeless Tobacco Never  ? ? ?Current Outpatient Medications on File Prior to Visit  ?Medication Sig Dispense Refill  ? acetaminophen (TYLENOL) 500 MG tablet Take 1,000 mg by mouth every 8 (eight) hours as needed for mild pain.    ? acyclovir (ZOVIRAX) 400 MG tablet TAKE 1 TABLET(400 MG) BY MOUTH TWICE DAILY 60 tablet 6  ? albuterol (PROVENTIL HFA;VENTOLIN HFA) 108 (90 Base) MCG/ACT inhaler Inhale 2 puffs into the lungs every 6 (six) hours as needed for wheezing or shortness of breath. 1 Inhaler 11  ? Alirocumab (PRALUENT) 150 MG/ML SOAJ Inject 150 mg into the skin every 14 (fourteen) days. 2 mL 12  ? amiodarone (PACERONE) 200 MG tablet Take 1/2 tablet (100 mg) by mouth twice daily 5 days a week 60 tablet 6  ? apixaban (ELIQUIS) 5 MG TABS tablet Take 5 mg by mouth 2  (two) times daily.    ? atorvastatin (LIPITOR) 80 MG tablet TAKE ONE TABLET BY MOUTH AT BEDTIME FOR CHOLESTEROL    ? azelastine (ASTELIN) 0.1 % nasal spray Place 2 sprays into both nostrils 2 (two) times daily. Use in each nostril as directed 30 mL 3  ? benzonatate (TESSALON) 100 MG capsule Take 1 capsule (100 mg total) by mouth 3 (three) times daily as needed for cough. 20 capsule 0  ? budesonide-formoterol (SYMBICORT) 160-4.5 MCG/ACT inhaler Inhale 2 puffs into the lungs 2 (two) times daily. 1 Inhaler 11  ? cetirizine (ZYRTEC) 10 MG tablet Take 10 mg by mouth daily as needed for allergies.     ? Coenzyme Q10 (COQ10) 200 MG CAPS Take 200 mg by mouth daily.    ? ezetimibe (ZETIA) 10 MG tablet Take 1 tablet (10 mg total) by mouth daily. 90 tablet 3  ? furosemide (LASIX) 20 MG tablet Take 2 tablets (40 mg total) by mouth daily. Do not take on Thursdays 180 tablet 3  ? guaiFENesin-dextromethorphan (ROBITUSSIN DM) 100-10 MG/5ML syrup Take 5 mLs by mouth every 4 (four) hours as needed for cough. 118 mL 0  ? isosorbide mononitrate (IMDUR) 30 MG 24 hr tablet Take 30 mg by mouth daily.    ? Krill Oil 350 MG CAPS Take 350 mg by mouth daily as needed (Heart).    ?  mirtazapine (REMERON) 15 MG tablet Take 15 mg by mouth at bedtime as needed (for panic associated with PTSD).     ? Multiple Vitamin (MULTIVITAMIN WITH MINERALS) TABS tablet Take 1 tablet by mouth daily.    ? nitroGLYCERIN (NITROSTAT) 0.4 MG SL tablet Place 1 tablet (0.4 mg total) under the tongue every 5 (five) minutes as needed for chest pain. 25 tablet 6  ? Tiotropium Bromide Monohydrate 2.5 MCG/ACT AERS Inhale 2 puffs into the lungs at bedtime. Spiriva    ? traMADol (ULTRAM) 50 MG tablet Take 1 tablet (50 mg total) by mouth every 12 (twelve) hours as needed. 60 tablet 0  ? venetoclax (VENCLEXTA) 100 MG tablet TAKE 1 TABLET BY MOUTH ONCE DAILY 30 tablet 0  ? ?No current facility-administered medications on file prior to visit.  ? ? ? ?ROS see history of present  illness ? ?Objective ? ?Physical Exam ?Vitals:  ? 10/18/21 1206  ?BP: 110/60  ?Pulse: 85  ?Temp: 98.9 ?F (37.2 ?C)  ?SpO2: 94%  ?Oxygen was in the mid 80s on ambulation into the office ? ?BP Readings from Last 3 Encounters:  ?10/18/21 110/60  ?09/19/21 (!) 144/72  ?09/06/21 (!) 143/64  ? ?Wt Readings from Last 3 Encounters:  ?10/18/21 212 lb 12.8 oz (96.5 kg)  ?09/13/21 219 lb 12.8 oz (99.7 kg)  ?09/03/21 220 lb (99.8 kg)  ? ? ?Physical Exam ?Constitutional:   ?   General: He is not in acute distress. ?   Appearance: He is not diaphoretic.  ?Cardiovascular:  ?   Rate and Rhythm: Normal rate and regular rhythm.  ?   Heart sounds: Normal heart sounds.  ?Pulmonary:  ?   Effort: Pulmonary effort is normal.  ?   Breath sounds: Normal breath sounds.  ?Skin: ?   General: Skin is warm and dry.  ?Neurological:  ?   Mental Status: He is alert.  ?   Comments: CN 3-12 intact, 4/5 strength in left grip, biceps, triceps, quads, hamstrings, plantarflexion, and dorsiflexion, 5/5 strength in right biceps, triceps, grip, quads, hamstrings, plantar and dorsiflexion, sensation to light touch diminished in his left upper and lower extremities, intact in right UE and LE, slow gait using a walker  ? ? ? ?Assessment/Plan: Please see individual problem list. ? ?Problem List Items Addressed This Visit   ? ? Acute respiratory failure with hypoxia ([place])  ?  The patient appears to be having acute respiratory issues as well with low oxygen on ambulation.  This could just be representative of a COPD exacerbation or some other larger issue.  Advised that he needs to be evaluated in the emergency room for this as well.  They will perform work-up of this issue as well. ?  ?  ? COPD (chronic obstructive pulmonary disease) ([place])  ?  Chronic issue.  We will refer back to his pulmonologist. ?  ?  ? Relevant Orders  ? Ambulatory referral to Pulmonology  ? Left-sided weakness - Primary  ?  This is a significant acute issue that poses threat to bodily  function and life.  This appears to have been an acute worsening yesterday.  I am concerned he had a stroke.  I discussed this with the patient and his wife.  I advised that he warrants evaluation in the emergency department and I think he needs to be admitted to the hospital for further work-up and monitoring.  They voiced understanding and noted they would go to the emergency department for evaluation.  I did offer EMS transport though they prefer to transport him themselves. ?  ?  ? ?Clinic RN called the charge nurse and relayed that the patient was coming via private vehicle. ? ?Return for Follow-up to be determined after ED evaluation. ? ?This visit occurred during the SARS-CoV-2 public health emergency.  Safety protocols were in place, including screening questions prior to the visit, additional usage of staff PPE, and extensive cleaning of exam room while observing appropriate contact time as indicated for disinfecting solutions.  ? ? ?Michael Rumps, MD ?[place] ? ?

## 2021-10-18 NOTE — Assessment & Plan Note (Signed)
Chronic issue.  We will refer back to his pulmonologist. ?

## 2021-10-18 NOTE — ED Provider Notes (Signed)
? ?[facility] ?Provider Note ? ? ? Event Date/Time  ? First MD Initiated Contact with Patient 10/18/21 1400   ?  (approximate) ? ? ?History  ? ?Weakness (L arm and leg since yesterday morning) ? ? ?HPI ? ?[name] is a 76 y.o. male with a history of A-fib on Eliquis, CLL, COPD, CAD status post CABG, OSA on CPAP, hypertension, hyperlipidemia, sick sinus syndrome status post pacemaker who presents for evaluation of left-sided weakness.  Patient and wife provide history.  According to them, patient woke up yesterday morning with left-sided weakness and also slurred speech.  The speech has resolved but the weakness has persisted.  He denies any prior history of stroke.  The wife try to bring him to the hospital yesterday but he did not want to come.  Today he was having difficulty walking which prompted his visit his primary care doctor.  PCP recommended patient come to the ED for evaluation.  No recent falls, no headache, no chest pain or shortness of breath.  Patient has had chronic shortness of breath since January when he had COVID but denies any acute changes. ?  ? ? ?Past Medical History:  ?Diagnosis Date  ? Anxiety   ? Arthritis   ? Atrial fibrillation ([place])   ? a. Dx 2013, recurred 02/2014, CHA2DS2VASc = 3 -->placed on Eliquis;  b. 02/2014 Echo: EF 50-55%, mid and apical anterior septum and mid and apical inf septum are abnl, mild to mod Ao sclerosis w/o AS.  ? Cancer associated pain   ? Chicken pox   ? Chronic lymphocytic leukemia ([place])   ? a. Dx 02/2014.  ? CLL (chronic lymphocytic leukemia) ([place])   ? Complication of anesthesia   ? History of  PTSD--do not touch patient when waking up from surgery.  ? COPD (chronic obstructive pulmonary disease) ([place])   ? Coronary artery disease   ? a. 04/2009 CABG x 3 (LIMA->LAD, VG->OM1, VG->PDA);  b. 09/2009 Cath: occluded VG x 2 w/ patent LIMA and L->R collats. EF 55%, mild antlat HK;  c. 10/2011 MV: EF 53%, no isch/infarct-->low risk.  ?  Dysrhythmia   ? hx of a-fib  ? GERD (gastroesophageal reflux disease)   ? occasional  ? History of chemotherapy 2015-2016  ? HOH (hard of hearing)   ? Bilateral Hearing Aids  ? Hypertension   ? Myocardial infarction [facility]) 2010  ? OSA on CPAP   ? USE C-PAP  ? Presence of permanent cardiac pacemaker 2017  ? PTSD (post-traumatic stress disorder)   ? PTSD (post-traumatic stress disorder)   ? Pure hypercholesterolemia   ? Rheumatic fever 1959  ? 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 TIA (transient ischemic attack) 11/02/2015  ? ? ?Past Surgical History:  ?Procedure Laterality Date  ? ABDOMINAL HERNIA REPAIR    ? APPENDECTOMY  06/21/1985  ? CARDIAC CATHETERIZATION  2010; 2011  ? ; Dr Fletcher Anon  ? CORONARY ARTERY BYPASS GRAFT  04/2009  ? "CABG X3"  ? EP IMPLANTABLE DEVICE N/A 03/03/2016  ? Procedure: Pacemaker Implant;  Surgeon: Deboraha Sprang, MD;  Location: [facility];  Service: Cardiovascular;  Laterality: N/A;  ? [place]  ? "shrapnel in my tailbone"  ? INGUINAL HERNIA REPAIR Right   ? INSERT / REPLACE / REMOVE PACEMAKER    ? JOINT REPLACEMENT Right 2018  ? LAPAROSCOPIC CHOLECYSTECTOMY    ? RIGHT/LEFT HEART CATH AND CORONARY/GRAFT ANGIOGRAPHY N/A 02/04/2021  ?  Procedure: RIGHT/LEFT HEART CATH AND CORONARY/GRAFT ANGIOGRAPHY;  Surgeon: Nelva Bush, MD;  Location: [facility];  Service: Cardiovascular;  Laterality: N/A;  ? [place]  ? TOTAL HIP ARTHROPLASTY Right 10/22/2016  ? Procedure: TOTAL HIP ARTHROPLASTY;  Surgeon: Dereck Leep, MD;  Location: [facility];  Service: Orthopedics;  Laterality: Right;  ? TOTAL HIP ARTHROPLASTY Left 11/04/2017  ? Procedure: TOTAL HIP ARTHROPLASTY;  Surgeon: Dereck Leep, MD;  Location: [facility];  Service: Orthopedics;  Laterality: Left;  ? ? ? ?Physical Exam  ? ?Triage Vital Signs: ?ED Triage Vitals  ?Enc Vitals Group  ?   BP 10/18/21 1356 123/70  ?   Pulse Rate 10/18/21 1356 81  ?   Resp 10/18/21 1356 18  ?    Temp 10/18/21 1356 98.2 ?F (36.8 ?C)  ?   Temp Source 10/18/21 1356 Oral  ?   SpO2 10/18/21 1356 95 %  ?   Weight 10/18/21 1412 212 lb 11.9 oz (96.5 kg)  ?   Height 10/18/21 1313 '5\' 9"'$  (1.753 m)  ?   Head Circumference --   ?   Peak Flow --   ?   Pain Score 10/18/21 1313 0  ?   Pain Loc --   ?   Pain Edu? --   ?   Excl. in [place]? --   ? ? ?Most recent vital signs: ?Vitals:  ? 10/18/21 1356  ?BP: 123/70  ?Pulse: 81  ?Resp: 18  ?Temp: 98.2 ?F (36.8 ?C)  ?SpO2: 95%  ? ? ? ?Constitutional: Alert and oriented. Well appearing and in no apparent distress. ?HEENT: ?     Head: Normocephalic and atraumatic.    ?     Eyes: Conjunctivae are normal. Sclera is non-icteric.  ?     Mouth/Throat: Mucous membranes are moist.  ?     Neck: Supple with no signs of meningismus. ?Cardiovascular: Regular rate and rhythm. No murmurs, gallops, or rubs. 2+ symmetrical distal pulses are present in all extremities.  ?Respiratory: Normal respiratory effort. Lungs are clear to auscultation bilaterally.  ?Gastrointestinal: Soft, non tender, and non distended with positive bowel sounds. No rebound or guarding. ?Genitourinary: No CVA tenderness. ?Musculoskeletal:  No edema, cyanosis, or erythema of extremities. ?Neurologic: Normal speech and language. Face is symmetric.  Left upper and lower extremity weakness, patient is able to elevate both extremities with drift but limbs do not hit the bed.   ?Skin: Skin is warm, dry and intact. No rash noted. ?Psychiatric: Mood and affect are normal. Speech and behavior are normal. ? ? ?NIH Stroke Scale ? ?Interval: Baseline ?Time: 2:54 PM ?Person Administering Scale: [place] ? ?Administer stroke scale items in the order listed. Record performance in each category after each subscale exam. Do not go back and change scores. Follow directions provided for each exam technique. Scores should reflect what the patient does, not what the clinician thinks the patient can do. The clinician should record answers  while administering the exam and work quickly. Except where indicated, the patient should not be coached (i.e., repeated requests to patient to make a special effort). ? ? ?1a  Level of consciousness: 0=alert; keenly responsive  ?1b. LOC questions:  0=Performs both tasks correctly  ?1c. LOC commands: 0=Performs both tasks correctly  ?2.  Best Gaze: 0=normal  ?3.  Visual: 0=No visual loss  ?4. Facial Palsy: 0=Normal symmetric movement  ?5a.  Motor left arm: 1=Drift, limb holds 90 (or 45) degrees but  drifts down before full 10 seconds: does not hit bed  ?5b.  Motor right arm: 0=No drift, limb holds 90 (or 45) degrees for full 10 seconds  ?6a. motor left leg: 1=Drift, limb holds 90 (or 45) degrees but drifts down before full 10 seconds: does not hit bed  ?6b  Motor right leg:  0=No drift, limb holds 90 (or 45) degrees for full 10 seconds  ?7. Limb Ataxia: 2=Present in two limbs  ?8.  Sensory: 0=Normal; no sensory loss  ?9. Best Language:  0=No aphasia, normal  ?10. Dysarthria: 0=Normal  ?11. Extinction and Inattention: 0=No abnormality  ? Total:   4  ? ? ? ?ED Results / Procedures / Treatments  ? ?Labs ?(all labs ordered are listed, but only abnormal results are displayed) ?Labs Reviewed  ?PROTIME-INR - Abnormal; Notable for the following components:  ?    Result Value  ? Prothrombin Time 18.6 (*)   ? INR 1.6 (*)   ? All other components within normal limits  ?APTT - Abnormal; Notable for the following components:  ? aPTT 40 (*)   ? All other components within normal limits  ?CBC - Abnormal; Notable for the following components:  ? RBC 3.17 (*)   ? Hemoglobin 10.5 (*)   ? HCT 33.3 (*)   ? MCV 105.0 (*)   ? RDW 16.9 (*)   ? All other components within normal limits  ?DIFFERENTIAL - Abnormal; Notable for the following components:  ? Abs Immature Granulocytes 0.16 (*)   ? All other components within normal limits  ?COMPREHENSIVE METABOLIC PANEL - Abnormal; Notable for the following components:  ? Potassium 3.4 (*)   ?  Glucose, Bld 120 (*)   ? Calcium 8.3 (*)   ? Albumin 3.4 (*)   ? All other components within normal limits  ?CBG MONITORING, ED  ? ? ? ?EKG ? ?ED ECG REPORT ?I, Rudene Re, the attending physician, personally

## 2021-10-18 NOTE — ED Triage Notes (Addendum)
Patient to ER with wife from [place]. Reports x1 day of shortness of breath, room air sats were 80% at office. Patietn has COPD. Wife reports that he has been short of breath since having covid in January. Patient also had left sided weakness that started yesterday. Patient sent by pcp for evaluation of possible stroke.  ? ?Wife reports left arm and leg were weak yesterday, patient had some slurred speech that has now resolved. Able to ambulate today but yesterday was unable to get out of bed.  ?

## 2021-10-18 NOTE — ED Notes (Signed)
See triage note  presents with pain and weakness to left side  states pain started yesterday   pain is worse today  family states his speech was slurred yesterday   on arrival speech is clear   ?

## 2021-10-18 NOTE — H&P (Signed)
?History and Physical  ? ? ?Patient: [name] QAS:[id number] DOB: 03/18/1946 ?DOA: 10/18/2021 ?DOS: the patient was seen and examined on 10/18/2021 ?PCP: Leone Haven, MD  ?Patient coming from: Home ? ?Chief Complaint:  ?Chief Complaint  ?Patient presents with  ? Weakness  ?  L arm and leg since yesterday morning  ? ?HPI: [name] is a 76 y.o. male with medical history significant for HTN, HFpEF, recent severe debilitation following COVID infection in January, atrial fibrillation on Eliquis, s/p PPM and CLL was in his USOH until yesterday when his wife noted that he was unable to get out of bed as per usual.  He had clear left-sided weakness and was having slurred speech.  Patient refused to come to the ED because he was tired of being in the hospital.  This morning, patient's speech had cleared however given continued left-sided weakness he agreed to come to the ED. ? ?In the ED patient was noted to have marked weakness on his left with NIH SS score of 4.  Head CT was negative for any bleed however patient was unable to have MRI or MRA due to pacemaker. ? ?Patient states he is very tired and sleepy.  Patient's wife notes that he has had significant lethargy since his COVID infection.  [place] but now barely likes to get out of bed.  He sleeps most of the time apparently.  She denies that there is been any new medications.  Notes his lethargy seems to be worsening over time.  Patient and wife both deny any fevers or chills, no nausea or vomiting, no diarrhea, he apparently has a very good appetite.  No dysuria. ? ? ?  ?Review of Systems: As mentioned in the history of present illness. All other systems reviewed and are negative. ?Past Medical History:  ?Diagnosis Date  ? Anxiety   ? Arthritis   ? Atrial fibrillation ([place])   ? a. Dx 2013, recurred 02/2014, CHA2DS2VASc = 3 -->placed on Eliquis;  b. 02/2014 Echo: EF 50-55%, mid and apical anterior septum and mid and  apical inf septum are abnl, mild to mod Ao sclerosis w/o AS.  ? Cancer associated pain   ? Chicken pox   ? Chronic lymphocytic leukemia ([place])   ? a. Dx 02/2014.  ? CLL (chronic lymphocytic leukemia) ([place])   ? Complication of anesthesia   ? History of  PTSD--do not touch patient when waking up from surgery.  ? COPD (chronic obstructive pulmonary disease) ([place])   ? Coronary artery disease   ? a. 04/2009 CABG x 3 (LIMA->LAD, VG->OM1, VG->PDA);  b. 09/2009 Cath: occluded VG x 2 w/ patent LIMA and L->R collats. EF 55%, mild antlat HK;  c. 10/2011 MV: EF 53%, no isch/infarct-->low risk.  ? Dysrhythmia   ? hx of a-fib  ? GERD (gastroesophageal reflux disease)   ? occasional  ? History of chemotherapy 2015-2016  ? HOH (hard of hearing)   ? Bilateral Hearing Aids  ? Hypertension   ? Myocardial infarction [facility]) 2010  ? OSA on CPAP   ? USE C-PAP  ? Presence of permanent cardiac pacemaker 2017  ? PTSD (post-traumatic stress disorder)   ? PTSD (post-traumatic stress disorder)   ? Pure hypercholesterolemia   ? Rheumatic fever 1959  ? 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 TIA (transient ischemic attack) 11/02/2015  ? ?Past Surgical History:  ?Procedure Laterality Date  ? ABDOMINAL  HERNIA REPAIR    ? APPENDECTOMY  06/21/1985  ? CARDIAC CATHETERIZATION  2010; 2011  ? ; Dr Fletcher Anon  ? CORONARY ARTERY BYPASS GRAFT  04/2009  ? "CABG X3"  ? EP IMPLANTABLE DEVICE N/A 03/03/2016  ? Procedure: Pacemaker Implant;  Surgeon: Deboraha Sprang, MD;  Location: [facility];  Service: Cardiovascular;  Laterality: N/A;  ? [place]  ? "shrapnel in my tailbone"  ? INGUINAL HERNIA REPAIR Right   ? INSERT / REPLACE / REMOVE PACEMAKER    ? JOINT REPLACEMENT Right 2018  ? LAPAROSCOPIC CHOLECYSTECTOMY    ? RIGHT/LEFT HEART CATH AND CORONARY/GRAFT ANGIOGRAPHY N/A 02/04/2021  ? Procedure: RIGHT/LEFT HEART CATH AND CORONARY/GRAFT ANGIOGRAPHY;  Surgeon: Nelva Bush, MD;  Location: [facility];  Service:  Cardiovascular;  Laterality: N/A;  ? [place]  ? TOTAL HIP ARTHROPLASTY Right 10/22/2016  ? Procedure: TOTAL HIP ARTHROPLASTY;  Surgeon: Dereck Leep, MD;  Location: [facility];  Service: Orthopedics;  Laterality: Right;  ? TOTAL HIP ARTHROPLASTY Left 11/04/2017  ? Procedure: TOTAL HIP ARTHROPLASTY;  Surgeon: Dereck Leep, MD;  Location: [facility];  Service: Orthopedics;  Laterality: Left;  ? ?Social History:  reports that he quit smoking about 15 years ago. His smoking use included cigarettes. He has a 40.00 pack-year smoking history. He has never used smokeless tobacco. He reports that he does not currently use alcohol. He reports that he does not use drugs. ? ?No Known Allergies ? ?Family History  ?Problem Relation Age of Onset  ? Heart disease Mother   ? Heart attack Mother   ? Coronary artery disease Other   ?     family history  ? ? ?Prior to Admission medications   ?Medication Sig Start Date End Date Taking? Authorizing Provider  ?acetaminophen (TYLENOL) 500 MG tablet Take 1,000 mg by mouth every 8 (eight) hours as needed for mild pain.    [provider]  ?acyclovir (ZOVIRAX) 400 MG tablet TAKE 1 TABLET(400 MG) BY MOUTH TWICE DAILY 08/26/21   Cammie Sickle, MD  ?albuterol (PROVENTIL HFA;VENTOLIN HFA) 108 (90 Base) MCG/ACT inhaler Inhale 2 puffs into the lungs every 6 (six) hours as needed for wheezing or shortness of breath. 12/01/16   Wilhelmina Mcardle, MD  ?Alirocumab (PRALUENT) 150 MG/ML SOAJ Inject 150 mg into the skin every 14 (fourteen) days. 03/26/21   [name], MD  ?amiodarone (PACERONE) 200 MG tablet Take 1/2 tablet (100 mg) by mouth twice daily 5 days a week 03/13/21   Deboraha Sprang, MD  ?apixaban (ELIQUIS) 5 MG TABS tablet Take 5 mg by mouth 2 (two) times daily.    [provider]  ?atorvastatin (LIPITOR) 80 MG tablet TAKE ONE TABLET BY MOUTH AT BEDTIME FOR CHOLESTEROL 08/07/20   [provider]  ?azelastine (ASTELIN) 0.1 % nasal  spray Place 2 sprays into both nostrils 2 (two) times daily. Use in each nostril as directed 07/17/20   Leone Haven, MD  ?benzonatate (TESSALON) 100 MG capsule Take 1 capsule (100 mg total) by mouth 3 (three) times daily as needed for cough. 09/03/21   Borders, Kirt Boys, NP  ?budesonide-formoterol (SYMBICORT) 160-4.5 MCG/ACT inhaler Inhale 2 puffs into the lungs 2 (two) times daily. 12/01/16   Wilhelmina Mcardle, MD  ?cetirizine (ZYRTEC) 10 MG tablet Take 10 mg by mouth daily as needed for allergies.     [provider]  ?Coenzyme Q10 (COQ10) 200 MG  CAPS Take 200 mg by mouth daily.    [provider]  ?ezetimibe (ZETIA) 10 MG tablet Take 1 tablet (10 mg total) by mouth daily. 01/06/17 11/01/27  [name], MD  ?furosemide (LASIX) 20 MG tablet Take 2 tablets (40 mg total) by mouth daily. Do not take on Thursdays 06/12/21   [name], MD  ?guaiFENesin-dextromethorphan [facility] DM) 100-10 MG/5ML syrup Take 5 mLs by mouth every 4 (four) hours as needed for cough. 09/06/21   Val Riles, MD  ?isosorbide mononitrate (IMDUR) 30 MG 24 hr tablet Take 30 mg by mouth daily. 07/28/21   [provider]  ?Astrid Drafts 350 MG CAPS Take 350 mg by mouth daily as needed (Heart).    [provider]  ?mirtazapine (REMERON) 15 MG tablet Take 15 mg by mouth at bedtime as needed (for panic associated with PTSD).     [provider]  ?Multiple Vitamin (MULTIVITAMIN WITH MINERALS) TABS tablet Take 1 tablet by mouth daily.    [provider]  ?nitroGLYCERIN (NITROSTAT) 0.4 MG SL tablet Place 1 tablet (0.4 mg total) under the tongue every 5 (five) minutes as needed for chest pain. 01/06/17   [name], MD  ?Tiotropium Bromide Monohydrate 2.5 MCG/ACT AERS Inhale 2 puffs into the lungs at bedtime. Spiriva 08/07/20   [provider]  ?traMADol (ULTRAM) 50 MG tablet Take 1 tablet (50 mg total) by mouth every 12 (twelve) hours as needed. 08/01/21   Cammie Sickle, MD  ?venetoclax (VENCLEXTA) 100 MG tablet TAKE 1 TABLET BY MOUTH ONCE DAILY 08/26/21 08/26/22  Cammie Sickle, MD  ? ? ?Physical Exam: ?Vitals:  ? 10/18/21 1313 10/18/21 1356 10/18/21 1412 10/18/21 1530  ?BP:

## 2021-10-18 NOTE — Assessment & Plan Note (Signed)
The patient appears to be having acute respiratory issues as well with low oxygen on ambulation.  This could just be representative of a COPD exacerbation or some other larger issue.  Advised that he needs to be evaluated in the emergency room for this as well.  They will perform work-up of this issue as well. ?

## 2021-10-18 NOTE — ED Notes (Signed)
Called dietary again about food tray. She advised they was working on it now. ?

## 2021-10-18 NOTE — Assessment & Plan Note (Signed)
This is a significant acute issue that poses threat to bodily function and life.  This appears to have been an acute worsening yesterday.  I am concerned he had a stroke.  I discussed this with the patient and his wife.  I advised that he warrants evaluation in the emergency department and I think he needs to be admitted to the hospital for further work-up and monitoring.  They voiced understanding and noted they would go to the emergency department for evaluation.  I did offer EMS transport though they prefer to transport him themselves. ?

## 2021-10-19 DIAGNOSIS — I10 Essential (primary) hypertension: Secondary | ICD-10-CM

## 2021-10-19 DIAGNOSIS — I25118 Atherosclerotic heart disease of native coronary artery with other forms of angina pectoris: Secondary | ICD-10-CM

## 2021-10-19 DIAGNOSIS — F4310 Post-traumatic stress disorder, unspecified: Secondary | ICD-10-CM | POA: Diagnosis not present

## 2021-10-19 DIAGNOSIS — I6389 Other cerebral infarction: Secondary | ICD-10-CM | POA: Diagnosis not present

## 2021-10-19 DIAGNOSIS — C9110 Chronic lymphocytic leukemia of B-cell type not having achieved remission: Secondary | ICD-10-CM

## 2021-10-19 DIAGNOSIS — I482 Chronic atrial fibrillation, unspecified: Secondary | ICD-10-CM

## 2021-10-19 LAB — HEMOGLOBIN A1C
Hgb A1c MFr Bld: 5.9 % — ABNORMAL HIGH (ref 4.8–5.6)
Mean Plasma Glucose: 122.63 mg/dL

## 2021-10-19 LAB — LIPID PANEL
Cholesterol: 79 mg/dL (ref 0–200)
HDL: 37 mg/dL — ABNORMAL LOW (ref >40)
LDL Cholesterol: 27 mg/dL (ref 0–99)
Total CHOL/HDL Ratio: 2.1 RATIO
Triglycerides: 77 mg/dL (ref <150)
VLDL: 15 mg/dL (ref 0–40)

## 2021-10-19 NOTE — Evaluation (Signed)
Physical Therapy Evaluation ?Patient Details ?Name: [name] ?MRN: [id number] ?DOB: [birth year] ?Today's Date: 10/19/2021 ? ?History of Present Illness ? Pt is a 76 year old male admitted with with left-sided weakness and also slurred speech. PMH significant for history of A-fib on Eliquis, CLL, COPD, CAD status post CABG, OSA on CPAP, hypertension, hyperlipidemia, sick sinus syndrome status post pacemaker who presents for evaluation of left-sided weakness.  Patient has had chronic shortness of breath since January when he had COVID but denies any acute changes. ?  ?Clinical Impression ? Pt is awake and alert sitting in bed w/ wife at bedside upon PT entrance into room for evaluation today. Pt A&Ox4, denies any c/o pain at rest, but reports c/o L sided weakness. PLOF and home-set up obtained from OT evaluation in Pt's chart. ? ?Pt is able to complete bed mobility w/ SUPERVISION; requires increased time to complete and reliance on bed rails for bed mobility. Once seated EOB pt requires modA and RW to progress to standing; reports needing to use bathroom and is able to ambulate to bathroom w/ CGA using RW. Stands to void w/ UE support from grab bars in bathroom and forearm support on sink to was hands. Is able to ambulate ~22f back to bed, noticeable LE buckling due to c/o weakness and fatigue from patient. Due to noticeable LE buckling, modA required to help support patient back into bed. After seated rest break patient is able to perform sit to stand w/ modA and RW to step-pivot transfer to recliner. Pt resting in recliner w/ all needs w/in reach with wife and daughter in room. Pt will benefit from continued skilled PT in order to increase functional strength/coordination, improve balance/mobility, and restore PLOF. Current discharge recommendation to CIR is appropriate due to the level of assistance required by the patient to ensure safety and improve overall function. ?   ?   ? ?Recommendations for follow up  therapy are one component of a multi-disciplinary discharge planning process, led by the attending physician.  Recommendations may be updated based on patient status, additional functional criteria and insurance authorization. ? ?Follow Up Recommendations Acute inpatient rehab (3hours/day) ? ?  ?Assistance Recommended at Discharge Frequent or constant Supervision/Assistance  ?Patient can return home with the following ? A little help with walking and/or transfers;A little help with bathing/dressing/bathroom;Assistance with cooking/housework;Assist for transportation;Help with stairs or ramp for entrance ? ?  ?Equipment Recommendations Rolling walker (2 wheels)  ?Recommendations for Other Services ?    ?  ?Functional Status Assessment Patient has had a recent decline in their functional status and demonstrates the ability to make significant improvements in function in a reasonable and predictable amount of time.  ? ?  ?Precautions / Restrictions Precautions ?Precautions: Fall ?Restrictions ?Weight Bearing Restrictions: No  ? ?  ? ?Mobility ? Bed Mobility ?Overal bed mobility: Needs Assistance ?Bed Mobility: Supine to Sit ?  ?  ?Supine to sit: Mod assist, HOB elevated ?  ?  ?  ?  ? ?Transfers ?Overall transfer level: Needs assistance ?Equipment used: Rolling walker (2 wheels) ?Transfers: Sit to/from Stand ?Sit to Stand: Mod assist ?  ?  ?  ?  ?  ?  ?  ? ?Ambulation/Gait ?Ambulation/Gait assistance: Min guard, Mod assist ?Gait Distance (Feet): 25 Feet ?Assistive device: Rolling walker (2 wheels) ?Gait Pattern/deviations: Step-through pattern, Decreased step length - right, Decreased step length - left, Decreased stride length ?Gait velocity: decreased ?  ?  ?General Gait Details: LE buckling following  ambulation to bathroom. ? ?Stairs ?  ?  ?  ?  ?  ? ?Wheelchair Mobility ?  ? ?Modified Rankin (Stroke Patients Only) ?  ? ?  ? ?Balance Overall balance assessment: Needs assistance ?Sitting-balance support: Feet  supported ?Sitting balance-Leahy Scale: Poor ?  ?  ?Standing balance support: Bilateral upper extremity supported, Reliant on assistive device for balance, During functional activity ?Standing balance-Leahy Scale: Fair ?  ?  ?  ?  ?  ?  ?  ?  ?  ?  ?  ?  ?   ? ? ? ?Pertinent Vitals/Pain Pain Assessment ?Pain Assessment: No/denies pain (doesn't endorse pain; states L arm is just weak)  ? ? ?Home Living Family/patient expects to be discharged to:: Private residence ?Living Arrangements: Spouse/significant other ?Available Help at Discharge: Family;Available 24 hours/day ?Type of Home: House ?Home Access: Stairs to enter;Ramped entrance ?Entrance Stairs-Rails: Can reach both ?Entrance Stairs-Number of Steps: 7 STE ?  ?Home Layout: One level ?Home Equipment: Rollator (4 wheels);Cane - single point;BSC/3in1;Shower seat;Electric scooter ?Additional Comments: believes he has a 3in1 and shower seat at home  ?  ?Prior Function Prior Level of Function : Independent/Modified Independent;Driving;History of Falls (last six months) ?  ?  ?  ?  ?  ?  ?Mobility Comments: no falls since previous hospitalization; rollator/SPC home/community amb; electric scooter ?ADLs Comments: asssit fri IADLs; mod I-I with ADL, med managment; not driving since previous admission ?  ? ? ?Hand Dominance  ? Dominant Hand: Right ? ?  ?Extremity/Trunk Assessment  ? Upper Extremity Assessment ?Upper Extremity Assessment: Defer to OT evaluation ?LUE Deficits / Details: LUE AROM: shoulder flexion approx 1/4 full available AROM, elbow flexion: 1/2 full available AROM, elbow extension: WFL, wrist flexion: approx 3/4 available full AROM, wrist extension approx 3/4 full available AROM; PROM WFL; LUE Tone noted to be WNL at this time ?LUE Sensation: decreased light touch ?LUE Coordination: decreased fine motor;decreased gross motor ?  ? ?Lower Extremity Assessment ?Lower Extremity Assessment: Generalized weakness ?LLE Deficits / Details: weakness throughout ?   ? ?   ?Communication  ? Communication: No difficulties;HOH  ?Cognition Arousal/Alertness: Awake/alert ?Behavior During Therapy: [facility] for tasks assessed/performed ?Overall Cognitive Status: Impaired/Different from baseline ?Area of Impairment: Orientation, Following commands, Safety/judgement, Awareness, Problem solving ?  ?  ?  ?  ?  ?  ?  ?  ?Orientation Level: Person, Place, Time, Situation ?  ?  ?Following Commands: Follows one step commands with increased time, Follows multi-step commands inconsistently ?Safety/Judgement: Decreased awareness of deficits ?  ?  ?General Comments: wife reports this is not pt baseline cognition ?  ?  ? ?  ?General Comments General comments (skin integrity, edema, etc.): vss throughout ? ?  ?Exercises    ? ?Assessment/Plan  ?  ?PT Assessment Patient needs continued PT services  ?PT Problem List Decreased strength;Decreased mobility;Decreased balance;Decreased activity tolerance;Decreased cognition;Decreased knowledge of precautions;Decreased safety awareness;Decreased coordination;Decreased knowledge of use of DME ? ?   ?  ?PT Treatment Interventions DME instruction;Therapeutic exercise;Balance training;Stair training;Gait training;Cognitive remediation;Therapeutic activities;Patient/family education;Functional mobility training;Neuromuscular re-education   ? ?PT Goals (Current goals can be found in the Care Plan section)  ?Acute Rehab PT Goals ?Patient Stated Goal: to get stronger and go home ?PT Goal Formulation: With patient/family ?Time For Goal Achievement: 11/02/21 ?Potential to Achieve Goals: Fair ? ?  ?Frequency Min 2X/week ?  ? ? ?Co-evaluation   ?  ?  ?  ?  ? ? ?  ?AM-PAC PT "  6 Clicks" Mobility  ?Outcome Measure Help needed turning from your back to your side while in a flat bed without using bedrails?: A Little ?Help needed moving from lying on your back to sitting on the side of a flat bed without using bedrails?: A Little ?Help needed moving to and from a bed to a chair  (including a wheelchair)?: A Lot ?Help needed standing up from a chair using your arms (e.g., wheelchair or bedside chair)?: A Lot ?Help needed to walk in hospital room?: A Little ?Help needed climbing 3-5 steps

## 2021-10-19 NOTE — Plan of Care (Signed)
°  Problem: Education: °Goal: Knowledge of General Education information will improve °Description: Including pain rating scale, medication(s)/side effects and non-pharmacologic comfort measures °Outcome: Progressing °  °Problem: Health Behavior/Discharge Planning: °Goal: Ability to manage health-related needs will improve °Outcome: Progressing °  °Problem: Clinical Measurements: °Goal: Ability to maintain clinical measurements within normal limits will improve °Outcome: Progressing °Goal: Will remain free from infection °Outcome: Progressing °Goal: Diagnostic test results will improve °Outcome: Progressing °Goal: Respiratory complications will improve °Outcome: Progressing °Goal: Cardiovascular complication will be avoided °Outcome: Progressing °  °Problem: Activity: °Goal: Risk for activity intolerance will decrease °Outcome: Progressing °  °Problem: Nutrition: °Goal: Adequate nutrition will be maintained °Outcome: Progressing °  °Problem: Coping: °Goal: Level of anxiety will decrease °Outcome: Progressing °  °Problem: Elimination: °Goal: Will not experience complications related to bowel motility °Outcome: Progressing °Goal: Will not experience complications related to urinary retention °Outcome: Progressing °  °Problem: Pain Managment: °Goal: General experience of comfort will improve °Outcome: Progressing °  °Problem: Safety: °Goal: Ability to remain free from injury will improve °Outcome: Progressing °  °Problem: Skin Integrity: °Goal: Risk for impaired skin integrity will decrease °Outcome: Progressing °  °Problem: Education: °Goal: Knowledge of disease or condition will improve °Outcome: Progressing °Goal: Knowledge of secondary prevention will improve (SELECT ALL) °Outcome: Progressing °Goal: Knowledge of patient specific risk factors will improve (INDIVIDUALIZE FOR PATIENT) °Outcome: Progressing °Goal: Individualized Educational Video(s) °Outcome: Progressing °  °Problem: Coping: °Goal: Will verbalize  positive feelings about self °Outcome: Progressing °Goal: Will identify appropriate support needs °Outcome: Progressing °  °Problem: Health Behavior/Discharge Planning: °Goal: Ability to manage health-related needs will improve °Outcome: Progressing °  °Problem: Self-Care: °Goal: Ability to participate in self-care as condition permits will improve °Outcome: Progressing °Goal: Verbalization of feelings and concerns over difficulty with self-care will improve °Outcome: Progressing °Goal: Ability to communicate needs accurately will improve °Outcome: Progressing °  °Problem: Nutrition: °Goal: Risk of aspiration will decrease °Outcome: Progressing °Goal: Dietary intake will improve °Outcome: Progressing °  °Problem: Intracerebral Hemorrhage Tissue Perfusion: °Goal: Complications of Intracerebral Hemorrhage will be minimized °Outcome: Progressing °  °Problem: Ischemic Stroke/TIA Tissue Perfusion: °Goal: Complications of ischemic stroke/TIA will be minimized °Outcome: Progressing °  °Problem: Spontaneous Subarachnoid Hemorrhage Tissue Perfusion: °Goal: Complications of Spontaneous Subarachnoid Hemorrhage will be minimized °Outcome: Progressing °  °

## 2021-10-19 NOTE — Progress Notes (Signed)
Inpatient Rehab Admissions Coordinator Note:  ? ?Per PT/OT patient was screened for CIR candidacy by Shyna Duignan Danford Bad, CCC-SLP. At this time, pt appears to be a potential candidate for CIR. I will place an order for rehab consult for full assessment, per our protocol.  Please contact me any with questions.. ? ? ?Gayland Curry, MS, CCC-SLP ?Admissions Coordinator ?[phone] ?10/19/21 ?4:16 PM ? ?

## 2021-10-19 NOTE — Evaluation (Signed)
Speech Language Pathology Evaluation ?Patient Details ?Name: [name] ?MRN: [id number] ?DOB: 08-15-1945 ?Today's Date: 10/19/2021 ?Time: 9163-8466 ?SLP Time Calculation (min) (ACUTE ONLY): 23 min ? ?Problem List:  ?Patient Active Problem List  ? Diagnosis Date Noted  ? Left-sided weakness 10/18/2021  ? CVA (cerebral vascular accident) ([place]) 10/18/2021  ? Acute respiratory disease due to COVID-19 virus 09/13/2021  ? Elevated troponin 09/13/2021  ? Fall at home, initial encounter 09/13/2021  ? Sepsis ([place]) 09/13/2021  ? AKI (acute kidney injury) ([place]) 09/13/2021  ? Weakness 09/04/2021  ? Acute respiratory failure with hypoxia ([place]) 09/04/2021  ? COVID-19 virus infection 09/04/2021  ? COPD exacerbation ([place]) 09/03/2021  ? Accelerating angina ([place]) 02/04/2021  ? Abnormal stress test   ? Cardiomyopathy [facility])   ? Wasp sting 12/25/2020  ? Chronic rhinitis 07/17/2020  ? 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 CAD (coronary artery disease)   ? Depression   ? Arthritis 09/16/2019  ? Elevated glucose 05/13/2019  ? Dehydration 03/14/2019  ? Sick sinus syndrome ([place]) 07/08/2016  ? Goals of care, counseling/discussion 07/08/2016  ? 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  ? Chronic fatigue   ? Thrombocytopenia ([place]) 02/29/2016  ? 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 PAD (peripheral artery disease) ([place])   ? OSA on CPAP   ? Chronic diastolic CHF (congestive heart failure) ([place]) 09/20/2015  ? CLL (chronic lymphocytic leukemia) ([place])   ? PTSD (post-traumatic stress disorder)   ? Osteoarthritis of both knees 07/05/2015  ? COPD (chronic obstructive pulmonary disease) ([place]) 01/01/2012  ? Shortness of breath 10/09/2011  ? Paroxysmal atrial fibrillation ([place]) 10/09/2011  ? Hyperlipidemia 11/28/2009  ? Essential hypertension 11/28/2009  ? ?Past Medical History:  ?Past Medical History:  ?Diagnosis Date  ? Anxiety   ? Arthritis   ? Atrial fibrillation ([place])   ? a. Dx 2013, recurred  02/2014, CHA2DS2VASc = 3 -->placed on Eliquis;  b. 02/2014 Echo: EF 50-55%, mid and apical anterior septum and mid and apical inf septum are abnl, mild to mod Ao sclerosis w/o AS.  ? Cancer associated pain   ? Chicken pox   ? Chronic lymphocytic leukemia ([place])   ? a. Dx 02/2014.  ? CLL (chronic lymphocytic leukemia) ([place])   ? Complication of anesthesia   ? History of  PTSD--do not touch patient when waking up from surgery.  ? COPD (chronic obstructive pulmonary disease) ([place])   ? Coronary artery disease   ? a. 04/2009 CABG x 3 (LIMA->LAD, VG->OM1, VG->PDA);  b. 09/2009 Cath: occluded VG x 2 w/ patent LIMA and L->R collats. EF 55%, mild antlat HK;  c. 10/2011 MV: EF 53%, no isch/infarct-->low risk.  ? Dysrhythmia   ? hx of a-fib  ? GERD (gastroesophageal reflux disease)   ? occasional  ? History of chemotherapy 2015-2016  ? HOH (hard of hearing)   ? Bilateral Hearing Aids  ? Hypertension   ? Myocardial infarction [facility]) 2010  ? OSA on CPAP   ? USE C-PAP  ? Presence of permanent cardiac pacemaker 2017  ? PTSD (post-traumatic stress disorder)   ? PTSD (post-traumatic stress disorder)   ? Pure hypercholesterolemia   ? Rheumatic fever 1959  ? 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 TIA (transient ischemic attack) 11/02/2015  ? ?Past Surgical History:  ?Past Surgical History:  ?Procedure Laterality Date  ? ABDOMINAL HERNIA REPAIR    ? APPENDECTOMY  06/21/1985  ? CARDIAC CATHETERIZATION  2010; 2011  ? ; Dr Fletcher Anon  ? CORONARY ARTERY BYPASS GRAFT  04/2009  ? "CABG X3"  ? EP IMPLANTABLE DEVICE N/A 03/03/2016  ? Procedure: Pacemaker Implant;  Surgeon: Deboraha Sprang, MD;  Location: [facility];  Service: Cardiovascular;  Laterality: N/A;  ? [place]  ? "shrapnel in my tailbone"  ? INGUINAL HERNIA REPAIR Right   ? INSERT / REPLACE / REMOVE PACEMAKER    ? JOINT REPLACEMENT Right 2018  ? LAPAROSCOPIC CHOLECYSTECTOMY    ? RIGHT/LEFT HEART CATH AND CORONARY/GRAFT ANGIOGRAPHY N/A 02/04/2021  ?  Procedure: RIGHT/LEFT HEART CATH AND CORONARY/GRAFT ANGIOGRAPHY;  Surgeon: Nelva Bush, MD;  Location: [facility];  Service: Cardiovascular;  Laterality: N/A;  ? [place]  ? TOTAL HIP ARTHROPLASTY Right 10/22/2016  ? Procedure: TOTAL HIP ARTHROPLASTY;  Surgeon: Dereck Leep, MD;  Location: [facility];  Service: Orthopedics;  Laterality: Right;  ? TOTAL HIP ARTHROPLASTY Left 11/04/2017  ? Procedure: TOTAL HIP ARTHROPLASTY;  Surgeon: Dereck Leep, MD;  Location: [facility];  Service: Orthopedics;  Laterality: Left;  ? ?HPI:  ?[name] is a 76 y.o. male with medical history significant for HTN, HFpEF, recent severe debilitation following COVID infection in January, atrial fibrillation on Eliquis, s/p PPM and CLL was in his USOH until yesterday when his wife noted that he was unable to get out of bed as per usual.  He had clear left-sided weakness and was having slurred speech.  Patient refused to come to the ED because he was tired of being in the hospital.  This morning, patient's speech had cleared however given continued left-sided weakness he agreed to come to the ED. No intracranial large vessel occlusion or significant stenosis.  2. 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  Otherwise no hemodynamically significant stenosis in the neck.  ? ?Assessment / Plan / Recommendation ?Clinical Impression ? Pt presents with mild to moderate cognitive impairments that are likely exacerbated by hearing impairment. Of note, pt was not aided during today's evaluation but time was spent insuring pt heard each stimulus item correctly. Would recommend re-assessing cognition at next venue of care and with his hearing aids in. Education provided to pt's wife that it was very important to charge his aids and have him wear them while inpatient. Pt was given the SLUMS Examination. 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 Specifically, pt had difficulty with memory (2 of 5 objects recalled), attention (frequent redirection to task), problem solving mental math and clock drawing. Education provided to pt and his wife as well as attending who entered room. At this time, would recommend further cognitive assessment  at next venue of care to prepare for possible return to home. ?   ?SLP Assessment ? SLP Recommendation/Assessment: All further Speech Lanaguage Pathology  needs can be addressed in the next venue of care ?SLP Visit Diagnosis: Cognitive communication deficit (R41.841)  ?  ?Recommendations for follow up therapy are one component of a multi-disciplinary discharge planning process, led by the attending physician.  Recommendations may be updated based on patient status, additional functional criteria and insurance authorization. ?   ?Follow Up Recommendations ? Skilled nursing-short term rehab (<3 hours/day)  ?  ?Assistance Recommended at Discharge ? Frequent or constant Supervision/Assistance  ?Functional Status Assessment Patient has had a recent decline in their functional status and demonstrates the ability to make significant  improvements in function in a reasonable and predictable amount of time.  ?Frequency and Duration   N/A ?  ?  ?   ?SLP Evaluation ?Cognition ? Overall Cognitive Status: Impaired/Different from baseline ?Arousal/Alertness: Awake/alert ?Orientation Level: Oriented X4 ?Year: 2023 ?Month: April ?Day of Week: Correct ?Attention: Selective ?Selective Attention: Impaired ?Selective Attention Impairment: Verbal complex;Functional complex (may also be related to hearing impairment) ?Memory: Impaired ?Memory Impairment: Decreased recall of new information;Decreased short term memory ?Decreased Short Term Memory: Verbal complex;Functional complex ?Awareness: Appears intact ?Problem Solving: Impaired ?Problem Solving Impairment: Verbal complex;Functional complex  ?  ?   ?Comprehension ?  Auditory Comprehension ?Overall Auditory Comprehension: Appears within functional limits for tasks assessed ?Visual Recognition/Discrimination ?Discrimination: Within Function Limits ?Reading Comprehension ?Reading Status: Not tested

## 2021-10-19 NOTE — Evaluation (Addendum)
Occupational Therapy Evaluation Patient Details Name: [name] MRN: [id number] DOB: 1945/09/02 Today's Date: 10/19/2021   History of Present Illness Pt is a 76 year old male admitted with with left-sided weakness and also slurred speech. PMH significant for history of A-fib on Eliquis, CLL, COPD, CAD status post CABG, OSA on CPAP, hypertension, hyperlipidemia, sick sinus syndrome status post pacemaker who presents for evaluation of left-sided weakness.  Patient has had chronic shortness of breath since January when he had COVID but denies any acute changes.   Clinical Impression   Chart reviewed, RN cleared pt for participation in OT evaluation. Wife is present throughout evaluation. PTA pt required increased time and intermittent assist for ADL since previous hospitalization. 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 No falls reported since previous hospitalization however pt with falls in the last six months. Pt presents with weakness and impairments throughout LUE including AROM, strength, FMC/dexterity. Tone appears WNL however will continue to assess. Vision appears [facility] however will continue to assess. Cognitively pt presents with performance below PLOF. Pt performed bed mobility with MIN-MOD A, STS with MOD A with RW, two steps up the bed with MIN A.  Grooming and UB dressing tasks completed with MIN A, LB with MAX A. Pt will benefit from ongoing OT to address functional deficits and to facilitate improved ADL performance. Recommend AIR following discharge. Family and patient in agreement with recommendations.     Recommendations for follow up therapy are one component of a multi-disciplinary discharge planning process, led by the attending physician.  Recommendations may be updated based on patient status, additional functional criteria and insurance authorization.   Follow Up Recommendations  Acute inpatient rehab  (3hours/day)    Assistance Recommended at Discharge Frequent or constant Supervision/Assistance  Patient can return home with the following A lot of help with walking and/or transfers;A lot of help with bathing/dressing/bathroom;Assist for transportation;Assistance with cooking/housework;Direct supervision/assist for financial management;Help with stairs or ramp for entrance;Direct supervision/assist for medications management    Functional Status Assessment  Patient has had a recent decline in their functional status and demonstrates the ability to make significant improvements in function in a reasonable and predictable amount of time.  Equipment Recommendations  Other (comment) (pt has recommended equipment, defer to next venue of care)    Recommendations for Other Services       Precautions / Restrictions Precautions Precautions: Fall Restrictions Weight Bearing Restrictions: No      Mobility Bed Mobility Overal bed mobility: Needs Assistance Bed Mobility: Supine to Sit, Sit to Supine     Supine to sit: Mod assist, HOB elevated Sit to supine: Min assist, HOB elevated        Transfers Overall transfer level: Needs assistance Equipment used: Rolling walker (2 wheels) Transfers: Sit to/from Stand Sit to Stand: Mod assist, From elevated surface                  Balance Overall balance assessment: Needs assistance Sitting-balance support: Feet supported Sitting balance-Leahy Scale: Fair Sitting balance - Comments: fair dynamic sitting balance during seated ADLs     Standing balance-Leahy Scale: Poor                             ADL either performed or assessed with clinical judgement   ADL Overall ADL's : Needs assistance/impaired Eating/Feeding: Set up;Sitting  Grooming: Therapist, nutritional;Applying deodorant;Sitting Grooming Details (indicate cue type and reason): cueing for initiation at edge of bed Upper Body Bathing: Set up;Sitting Upper Body  Bathing Details (indicate cue type and reason): cueing for initiation at edge of bed Lower Body Bathing: Maximal assistance Lower Body Bathing Details (indicate cue type and reason): anticipated Upper Body Dressing : Moderate assistance;Sitting Upper Body Dressing Details (indicate cue type and reason): donn gown at edge of bed Lower Body Dressing: Maximal assistance Lower Body Dressing Details (indicate cue type and reason): socks in bed     Toileting- Clothing Manipulation and Hygiene: Maximal assistance               Vision Patient Visual Report: No change from baseline Vision Assessment?: Yes Eye Alignment: Within Functional Limits Ocular Range of Motion: Within Functional Limits Tracking/Visual Pursuits: Able to track stimulus in all quads without difficulty Visual Fields: No apparent deficits Additional Comments: vision appears WFL will continue to assess     Perception     Praxis      Pertinent Vitals/Pain Pain Assessment Pain Assessment: 0-10 Pain Score: 9  Pain Location: L side Pain Descriptors / Indicators: Tingling, Constant, Discomfort Pain Intervention(s): Limited activity within patient's tolerance, Monitored during session, Premedicated before session     Hand Dominance Right   Extremity/Trunk Assessment Upper Extremity Assessment Upper Extremity Assessment: Generalized weakness;LUE deficits/detail LUE Deficits / Details: LUE AROM: shoulder flexion approx 1/4 full available AROM, elbow flexion: 1/2 full available AROM, elbow extension: WFL, wrist flexion: approx 3/4 available full AROM, wrist extension approx 3/4 full available AROM; PROM WFL; LUE Tone noted to be WNL at this time LUE Sensation: decreased light touch LUE Coordination: decreased fine motor;decreased gross motor   Lower Extremity Assessment Lower Extremity Assessment: LLE deficits/detail;Defer to PT evaluation LLE Deficits / Details: weakness throughout       Communication  Communication Communication: No difficulties;HOH   Cognition Arousal/Alertness: Awake/alert, Lethargic Behavior During Therapy: WFL for tasks assessed/performed Overall Cognitive Status: Impaired/Different from baseline Area of Impairment: Orientation, Following commands, Safety/judgement, Awareness, Problem solving                 Orientation Level: Person, Place, Time, Situation     Following Commands: Follows one step commands with increased time, Follows multi-step commands inconsistently Safety/Judgement: Decreased awareness of deficits Awareness: Emergent Problem Solving: Slow processing, Decreased initiation, Requires verbal cues, Requires tactile cues General Comments: wife reports this is not pt baseline cognition     General Comments  vss throughout    Exercises Other Exercises Other Exercises: edu re: role of OT, role of rehab, discharge recommendations, LUE HEP   Shoulder Instructions      Home Living Family/patient expects to be discharged to:: Private residence Living Arrangements: Spouse/significant other Available Help at Discharge: Family;Available 24 hours/day Type of Home: House Home Access: Stairs to enter;Ramped entrance Entrance Stairs-Number of Steps: 7 STE Entrance Stairs-Rails: Can reach both Home Layout: One level     Bathroom Shower/Tub: Walk-in Pensions consultant: Handicapped height Bathroom Accessibility: Yes How Accessible: Accessible via walker Home Equipment: Rollator (4 wheels);Cane - single point;BSC/3in1;Shower seat;Electric scooter   Additional Comments: believes he has a 3in1 and shower seat at home      Prior Functioning/Environment Prior Level of Function : Independent/Modified Independent;Driving;History of Falls (last six months)             Mobility Comments: no falls since previous hospitalization; rollator/SPC home/community amb; electric scooter ADLs Comments: asssit fri  IADLs; mod I-I with ADL,  med managment; not driving since previous admission        OT Problem List: Decreased strength;Impaired balance (sitting and/or standing);Decreased cognition;Decreased knowledge of precautions;Decreased range of motion;Decreased safety awareness;Decreased activity tolerance;Decreased coordination;Decreased knowledge of use of DME or AE;Impaired sensation;Impaired UE functional use      OT Treatment/Interventions: Self-care/ADL training;Therapeutic exercise;Patient/family education;Modalities;Manual therapy;Neuromuscular education;Balance training;Splinting;Therapeutic activities;DME and/or AE instruction;Cognitive remediation/compensation    OT Goals(Current goals can be found in the care plan section) Acute Rehab OT Goals Patient Stated Goal: get stronger OT Goal Formulation: With patient/family Time For Goal Achievement: 11/02/21 Potential to Achieve Goals: Good ADL Goals Pt Will Perform Grooming: with supervision;sitting Pt Will Perform Upper Body Dressing: with supervision;sitting Pt Will Perform Lower Body Dressing: with supervision;sit to/from stand Pt Will Transfer to Toilet: with supervision Pt Will Perform Toileting - Clothing Manipulation and hygiene: with supervision;sit to/from stand Additional ADL Goal #1: pt will participate in LUE HEP with supervision in order to improve LUE functional use during ADl tasks  OT Frequency: Min 3X/week    Co-evaluation              AM-PAC OT "6 Clicks" Daily Activity     Outcome Measure Help from another person eating meals?: A Little Help from another person taking care of personal grooming?: A Little Help from another person toileting, which includes using toliet, bedpan, or urinal?: A Lot Help from another person bathing (including washing, rinsing, drying)?: A Lot Help from another person to put on and taking off regular upper body clothing?: A Lot Help from another person to put on and taking off regular lower body clothing?: A  Lot 6 Click Score: 14   End of Session Equipment Utilized During Treatment: Gait belt;Rolling walker (2 wheels) Nurse Communication: Mobility status  Activity Tolerance: Patient tolerated treatment well Patient left: in bed;with call bell/phone within reach;with bed alarm set;with family/visitor present  OT Visit Diagnosis: Unsteadiness on feet (R26.81);History of falling (Z91.81);Muscle weakness (generalized) (M62.81);Hemiplegia and hemiparesis                Time: 7425-9563 OT Time Calculation (min): 30 min Charges:  OT General Charges $OT Visit: 1 Visit OT Evaluation $OT Eval High Complexity: 1 High OT Treatments $Self Care/Home Management : 8-22 mins Oleta Mouse, OTD OTR/L  10/19/21, 11:06 AM

## 2021-10-19 NOTE — Progress Notes (Signed)
?PROGRESS NOTE ? ? ? ?[name]  QJJ:[id number] DOB: 01-25-1946 DOA: 10/18/2021 ?PCP: Leone Haven, MD  ? ?Brief Narrative:  ?Per admitting MD: ?[name] is a 76 y.o. male with medical history significant for HTN, HFpEF, recent severe debilitation following COVID infection in January, atrial fibrillation on Eliquis, s/p PPM and CLL was in his USOH until yesterday when his wife noted that he was unable to get out of bed as per usual.  He had clear left-sided weakness and was having slurred speech.  Patient refused to come to the ED because he was tired of being in the hospital.  This morning, patient's speech had cleared however given continued left-sided weakness he agreed to come to the ED. ?  ?In the ED patient was noted to have marked weakness on his left with NIH SS score of 4.  Head CT was negative for any bleed however patient was unable to have MRI or MRA due to pacemaker. ?  ?Patient states he is very tired and sleepy.  Patient's wife notes that he has had significant lethargy since his COVID infection.  [place] now barely likes to get out of bed.  He sleeps most of the time apparently.  She denies that there is been any new medications.  Notes his lethargy seems to be worsening over time.  Patient and wife both deny any fevers or chills, no nausea or vomiting, no diarrhea, he apparently has a very good appetite.  No dysuria. ? ? ?Assessment & Plan: ?  ?Principal Problem: ?  CVA (cerebral vascular accident) ([place]) ?Active Problems: ?  Essential hypertension ?  CLL (chronic lymphocytic leukemia) ([place]) ?  PTSD (post-traumatic stress disorder) ?  Chronic diastolic CHF (congestive heart failure) ([place]) ?  CAD (coronary artery disease) ? ? ?CVA ?Discussed with Dr. Leonel Ramsay who will come and see the patient tomorrow-f/up neuro recs ?We will continue to hold Eliquis per his recommendation ?Continue aspirin 81 mg daily as patient is not on any antiplatelets at  home ?Patient didn't get MRI due to pacemaker-wife to bring in card to see if it is MRI friendly ?CTA head and neck without acute finding ?Echocardiogram ordered-pending ?PT/OT and speech therapy eval appreciated-PT final recs pending ?  ?Lethargy ?Patient apparently has been lethargic and sleepy since January when he had COVID ?Wife does not think he is on any new medications ?Unclear cause of lethargy, possibly has long COVID which may or may not be associated with lethargy ?However he is on Remeron and trazodone for his PTSD and these may well be contributory ?He is quite sleepy when I see him however his arousable by voice alone and speaks coherently ? ?  ?PTSD ?Continue Remeron and trazodone as noted above ?Consider psychiatric input for possible change in medications ?  ?Atrial fibrillation ?Continue amiodarone ?Eliquis being held per neurology recommendations ?  ?HTN ?Permissive hypertension ?  ?CLL ?Continue home medications ?  ?HFpEF/Cardiomyopathy ?Patient is euvolemic ?Continue Lasix ?Echocardiogram ordered as above ?  ?Long COVID ?Supportive care ?  ?COPD ?No evidence for acute flare ? ?DVT prophylaxis: Lovenox SQ  ?Code Status: FULL ? ?  ?Code Status Orders  ?(From admission, onward)  ?  ? ? ?  ? ?  Start     Ordered  ? 10/18/21 1644  Full code  Continuous       ? 10/18/21 1655  ? ?  ?  ? ?  ? ?Code  Status History   ? ? Date Active Date Inactive Code Status Order ID Comments User Context  ? 09/13/2021 1830 09/19/2021 2353 DNR [id number]  Ivor Costa, MD ED  ? 09/13/2021 1548 09/13/2021 1830 Full Code [id number]  Ivor Costa, MD ED  ? 09/03/2021 2358 09/06/2021 1846 Full Code [id number]  Cox, Amy Delane Ginger, DO ED  ? 02/04/2021 0855 02/04/2021 1605 Full Code [id number]  End, Harrell Gave, MD Inpatient  ? 03/14/2020 2040 03/18/2020 1738 Full Code [id number]  Clarnce Flock, MD ED  ? 02/03/2020 1539 02/04/2020 1917 Full Code [id number]  Ivor Costa, MD ED  ? 11/04/2017 1141 11/06/2017 1803 Full Code [id number]  Hooten, Laurice Record, MD  Inpatient  ? 10/22/2016 1641 10/24/2016 1330 Full Code [id number]  Dereck Leep, MD Inpatient  ? 02/29/2016 1338 03/04/2016 1627 Full Code [id number]  Erma Heritage, PA Inpatient  ? 02/26/2016 1243 02/29/2016 1216 Full Code [id number]  Dustin Flock, MD ED  ? 11/02/2015 1434 11/03/2015 1734 Full Code [id number]  Gladstone Lighter, MD ED  ? 08/22/2015 1943 08/23/2015 1832 Full Code [id number]  Nicholes Mango, MD Inpatient  ? ?  ? ?Advance Directive Documentation   ? ?Flowsheet Row Most Recent Value  ?Type of Advance Directive Healthcare Power of [place], Living will  ?Pre-existing out of facility DNR order (yellow form or pink MOST form) --  ?"MOST" Form in Place? --  ? ?  ? ?Family Communication: WIFE AT BEDSIDE  ?Disposition Plan:   PENDING MRI WORKUP AND NEUOR EVAL, PT NOT SAFE FOR DISCHARGE CURRENTLY ?Consults called: None ?Admission status: INPT ? ? ?Consultants:  ?NEURO ? ?Procedures:  ?DG Chest 2 View ? ?Result Date: 10/18/2021 ?CLINICAL DATA:  Shortness of breath EXAM: CHEST - 2 VIEW COMPARISON:  Chest x-ray 09/13/2021 FINDINGS: Heart size and mediastinum are stable and within normal limits. Cardiac surgical changes and median sternotomy wires. Left-sided cardiac device. Linear opacities again seen in the left lower lung zone likely representing scarring/chronic subsegmental atelectasis. No new consolidation identified. No pleural effusion or pneumothorax. IMPRESSION: Stable appearing chronic changes with no acute process identified. Electronically Signed   By: Ofilia Neas M.D.   On: 10/18/2021 14:04  ? ?CT HEAD WO CONTRAST ? ?Result Date: 10/18/2021 ?CLINICAL DATA:  Shortness of breath, generalized weakness EXAM: CT HEAD WITHOUT CONTRAST TECHNIQUE: Contiguous axial images were obtained from the base of the skull through the vertex without intravenous contrast. RADIATION DOSE REDUCTION: This exam was performed according to the departmental dose-optimization program which includes automated exposure control,  adjustment of the mA and/or kV according to patient size and/or use of iterative reconstruction technique. COMPARISON:  09/13/2021 FINDINGS: Brain: No acute intracranial findings are seen. Ventricles are not dilated. Cavum septum pellucidum and cavum septum vergae are seen. There is no shift of midline structures. Cortical sulci are prominent. Vascular: There are scattered arterial calcifications. Skull: Unremarkable. Sinuses/Orbits: There is mucosal thickening in the ethmoid, maxillary and sphenoid sinuses. Other: None IMPRESSION: No acute intracranial findings are seen in noncontrast CT brain. Atrophy. Chronic sinusitis. Electronically Signed   By: Elmer Picker M.D.   On: 10/18/2021 13:48  ? ?CT ANGIO HEAD NECK W WO CM (CODE STROKE) ? ?Result Date: 10/18/2021 ?CLINICAL DATA:  Stroke suspected EXAM: CT ANGIOGRAPHY HEAD AND NECK TECHNIQUE: Multidetector CT imaging of the head and neck was performed using the standard protocol during bolus administration of intravenous contrast. Multiplanar CT image reconstructions and MIPs were obtained to evaluate the vascular anatomy. Carotid stenosis measurements (when applicable) are obtained utilizing  NASCET criteria, using the distal internal carotid diameter as the denominator. RADIATION DOSE REDUCTION: This exam was performed according to the departmental dose-optimization program which includes automated exposure control, adjustment of the mA and/or kV according to patient size and/or use of iterative reconstruction technique. CONTRAST:  27m OMNIPAQUE IOHEXOL 350 MG/ML SOLN COMPARISON:  CTA head neck 11/02/2015, correlation is also made with CT head 10/18/2021 FINDINGS: CT HEAD FINDINGS For noncontrast findings, please see same day CT head. CTA NECK FINDINGS Aortic arch: Standard branching. Imaged portion shows no evidence of aneurysm or dissection. No significant stenosis of the major arch vessel origins. Aortic atherosclerosis Right carotid system: No evidence of  dissection, stenosis (50% or greater) or occlusion. Calcified and noncalcified plaque at the bifurcation and in the proximal right ICA is not hemodynamically significant. Left carotid system: No evidence of diss

## 2021-10-19 NOTE — Progress Notes (Incomplete)
Occupational Therapy Evaluation ?Patient Details ?Name: [name] ?MRN: [id number] ?DOB: September 15, 1945 ?Today's Date: 10/19/2021 ? ? ?History of Present Illness    ? ?Clinical Impression ?  ?***  ?   ? ?Recommendations for follow up therapy are one component of a multi-disciplinary discharge planning process, led by the attending physician.  Recommendations may be updated based on patient status, additional functional criteria and insurance authorization.  ? ?Follow Up Recommendations ?    ?  ?Assistance Recommended at Discharge    ?Patient can return home with the following   ? ?  ?Functional Status Assessment ?    ?Equipment Recommendations ?    ?  ?Recommendations for Other Services   ? ? ?  ?Precautions / Restrictions    ? ?  ? ?Mobility Bed Mobility ?  ?  ?  ?  ?  ?  ?  ?  ?  ? ?Transfers ?  ?  ?  ?  ?  ?  ?  ?  ?  ?  ?  ? ?  ?Balance   ?  ?  ?  ?  ?  ?  ?  ?  ?  ?  ?  ?  ?  ?  ?  ?  ?  ?  ?   ? ?ADL either performed or assessed with clinical judgement  ? ?ADL   ?  ?  ?  ?  ?  ?  ?  ?  ?  ?  ?  ?  ?  ?  ?  ?  ?  ?  ?  ?   ? ? ? ?Vision   ?   ?   ?Perception   ?  ?Praxis   ?  ? ?Pertinent Vitals/Pain Pain Assessment ?Pain Assessment: 0-10 ?Pain Score: 9  ?Pain Location: L side ?Pain Descriptors / Indicators: Tingling, Constant, Discomfort ?Pain Intervention(s): Limited activity within patient's tolerance, Monitored during session, Premedicated before session  ? ? ? ?Hand Dominance Right ?  ?Extremity/Trunk Assessment Upper Extremity Assessment ?Upper Extremity Assessment: Generalized weakness;LUE deficits/detail ?  ?  ?  ?  ?  ?Communication Communication ?Communication: No difficulties ?  ?Cognition Arousal/Alertness: Awake/alert, Lethargic ?Behavior During Therapy: [facility] for tasks assessed/performed ?Overall Cognitive Status: Impaired/Different from baseline ?Area of Impairment: Orientation ?  ?  ?  ?  ?  ?  ?  ?  ?Orientation Level: Person, Place, Time, Situation ?  ?  ?  ?  ?  ?  ?  ?  ?  ?General Comments     ? ?  ?Exercises   ?  ?Shoulder Instructions    ? ? ?Home Living Family/patient expects to be discharged to:: Private residence ?Living Arrangements: Spouse/significant other ?Available Help at Discharge: Family;Available 24 hours/day ?Type of Home: House ?Home Access: Stairs to enter;Ramped entrance ?Entrance Stairs-Number of Steps: 7 STE ?Entrance Stairs-Rails: Can reach both ?Home Layout: One level ?  ?  ?Bathroom Shower/Tub: Walk-in shower;Curtain ?  ?Bathroom Toilet: Handicapped height ?Bathroom Accessibility: Yes ?How Accessible: Accessible via walker ?Home Equipment: Rollator (4 wheels);Cane - single point;BSC/3in1;Shower seat;Electric scooter ?  ?  ?  ? ?  ?Prior Functioning/Environment   ?  ?  ?  ?  ?  ?  ?Mobility Comments: no falls since previous hospitalization; rollator/SPC home/community amb; electric scooter ?ADLs Comments: asssit fri IADLs; mod I-I with ADL, med managment; not driving since previous admission ?  ? ?  ?  ?OT Problem List:   ?  ?   ?  OT Treatment/Interventions:    ?  ?OT Goals(Current goals can be found in the care plan section)    ?OT Frequency:   ?  ? ?Co-evaluation   ?  ?  ?  ?  ? ?  ?AM-PAC OT "6 Clicks" Daily Activity     ?Outcome Measure   ?  ?  ?  ?  ?  ?  ?  ?End of Session   ? ?Activity Tolerance:   ?Patient left:   ? ?   ?              ?Time:  -  ?  ?Charges:    ? ?{enter signature dotphrase here} ? ?Philippa Chester ?10/19/2021, 9:14 AM ?

## 2021-10-19 NOTE — Plan of Care (Signed)
Initial Add ? ? ?Problem: Education: ?Goal: Knowledge of disease or condition will improve ?Outcome: Not Applicable ?Goal: Knowledge of secondary prevention will improve (SELECT ALL) ?Outcome: Not Applicable ?Goal: Knowledge of patient specific risk factors will improve (INDIVIDUALIZE FOR PATIENT) ?Outcome: Not Applicable ?Goal: Individualized Educational Video(s) ?Outcome: Not Applicable ?  ?Problem: Self-Care: ?Goal: Ability to communicate needs accurately will improve ?Outcome: Not Applicable ?  ?Problem: Nutrition: ?Goal: Dietary intake will improve ?Outcome: Not Applicable ?  ?

## 2021-10-19 NOTE — TOC Initial Note (Signed)
Transition of Care (TOC) - Initial/Assessment Note  ? ? ?Patient Details  ?Name: [name] ?MRN: [id number] ?Date of Birth: [birth year] ? ?Transition of Care (TOC) CM/SW Contact:    ?Tieasha Larsen E Mayfield Schoene, LCSW ?Phone Number: ?10/19/2021, 10:19 AM ? ?Clinical Narrative:                Completed high readmission risk assessment with patient. Patient lives with his wife and granddaughter. Patient's wife provides transportation. PCP is Dr. Caryl Bis and patient has multiple specialists. Pharmacy is [facility] in [place]. Patient has a RW, cane, cpap, scooter, and ramp at home. Patient went to [facility] recently for STR. Patient reported he was recently set up with [facility], could not recall agency. PT and OT evals are pending. TOC will follow for DC planning.  ? ? ?Expected Discharge Plan: [place] ?Barriers to Discharge: Continued Medical Work up ? ? ?Patient Goals and CMS Choice ?Patient states their goals for this hospitalization and ongoing recovery are:: home with spouse ?CMS Medicare.gov Compare Post Acute Care list provided to:: Patient ?Choice offered to / list presented to : Patient ? ?Expected Discharge Plan and Services ?Expected Discharge Plan: [place] ?  ?  ?  ?Living arrangements for the past 2 months: [place] ?                ?  ?  ?  ?  ?  ?  ?  ?  ?  ?  ? ?Prior Living Arrangements/Services ?Living arrangements for the past 2 months: [place] ?Lives with:: Spouse ?Patient language and need for interpreter reviewed:: Yes ?Do you feel safe going back to the place where you live?: Yes      ?Need for Family Participation in Patient Care: Yes (Comment) ?Care giver support system in place?: Yes (comment) ?Current home services: DME ?Criminal Activity/Legal Involvement Pertinent to Current Situation/Hospitalization: No - Comment as needed ? ?Activities of Daily Living ?Home Assistive Devices/Equipment: Cane (specify quad or straight) (straight) ?ADL Screening  (condition at time of admission) ?Patient's cognitive ability adequate to safely complete daily activities?: Yes ?Is the patient deaf or have difficulty hearing?: Yes ?Does the patient have difficulty seeing, even when wearing glasses/contacts?: No ?Does the patient have difficulty concentrating, remembering, or making decisions?: No ?Patient able to express need for assistance with ADLs?: Yes ?Does the patient have difficulty dressing or bathing?: Yes ?Independently performs ADLs?: No ?Communication: Independent ?Dressing (OT): Needs assistance ?Is this a change from baseline?: Change from baseline, expected to last <3days ?Grooming: Independent ?Feeding: Independent ?Bathing: Needs assistance ?Is this a change from baseline?: Change from baseline, expected to last <3 days ?Toileting: Needs assistance ?Is this a change from baseline?: Change from baseline, expected to last <3 days ?In/Out Bed: Needs assistance ?Is this a change from baseline?: Change from baseline, expected to last <3 days ?Walks in Home: Needs assistance ?Is this a change from baseline?: Change from baseline, expected to last <3 days ?Does the patient have difficulty walking or climbing stairs?: Yes ?Weakness of Legs: Both ?Weakness of Arms/Hands: Left ? ?Permission Sought/Granted ?Permission sought to share information with : Facility Sport and exercise psychologist, Family Supports ?Permission granted to share information with : Yes, Verbal Permission Granted ? Share Information with NAME: spouse ? Permission granted to share info w AGENCY: as needed ?   ?   ? ?Emotional Assessment ?  ?  ?  ?Orientation: : Oriented to Self, Oriented to Place, Oriented to  Time, Oriented to Situation ?Alcohol / Substance Use: Not Applicable ?Psych Involvement: No (comment) ? ?Admission diagnosis:  CVA (cerebral vascular accident) ([place]) [I63.9] ?Cerebrovascular accident (CVA), unspecified mechanism ([place]) [I63.9] ?Patient Active Problem List  ? Diagnosis Date Noted  ?  Left-sided weakness 10/18/2021  ? CVA (cerebral vascular accident) ([place]) 10/18/2021  ? Acute respiratory disease due to COVID-19 virus 09/13/2021  ? Elevated troponin 09/13/2021  ? Fall at home, initial encounter 09/13/2021  ? Sepsis ([place]) 09/13/2021  ? AKI (acute kidney injury) ([place]) 09/13/2021  ? Weakness 09/04/2021  ? Acute respiratory failure with hypoxia ([place]) 09/04/2021  ? COVID-19 virus infection 09/04/2021  ? COPD exacerbation ([place]) 09/03/2021  ? Accelerating angina ([place]) 02/04/2021  ? Abnormal stress test   ? Cardiomyopathy [place])   ? Wasp sting 12/25/2020  ? Chronic rhinitis 07/17/2020  ? Secondary immune deficiency disorder ([place]) 06/07/2020  ? CAD (coronary artery disease)   ? Depression   ? Arthritis 09/16/2019  ? Elevated glucose 05/13/2019  ? Dehydration 03/14/2019  ? Sick sinus syndrome ([place]) 07/08/2016  ? Goals of care, counseling/discussion 07/08/2016  ? 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  ? Chronic fatigue   ? Thrombocytopenia ([place]) 02/29/2016  ? 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 PAD (peripheral artery disease) ([place])   ? OSA on CPAP   ? Chronic diastolic CHF (congestive heart failure) ([place]) 09/20/2015  ? CLL (chronic lymphocytic leukemia) ([place])   ? PTSD (post-traumatic stress disorder)   ? Osteoarthritis of both knees 07/05/2015  ? COPD (chronic obstructive pulmonary disease) ([place]) 01/01/2012  ? Shortness of breath 10/09/2011  ? Paroxysmal atrial fibrillation ([place]) 10/09/2011  ? Hyperlipidemia 11/28/2009  ? Essential hypertension 11/28/2009  ? ?PCP:  Leone Haven, MD ?Pharmacy:   ?[facility] [place], [place] AT [facility] OF [address] ?[place] ?[place] [address] ?Phone: [phone] Fax: [phone] ? ?[facility] ?[address] ?[place] [address] ?Phone: [phone] Fax: [phone] ? ? ? ? ?Social Determinants of Health (SDOH) Interventions ?  ? ?Readmission Risk  Interventions ? ?  10/19/2021  ? 10:18 AM  ?Readmission Risk Prevention Plan  ?Transportation Screening Complete  ?PCP or Specialist Appt within 3-5 Days Complete  ?[place] or Home Care Consult Complete  ?Social Work Consult for [place] Planning/Counseling Complete  ?Palliative Care Screening Not Applicable  ?Medication Review Press photographer) Complete  ? ? ? ?

## 2021-10-19 NOTE — Consult Note (Signed)
Neurology Consultation ?Reason for Consult: Left-sided weakness ?Referring Physician: Spongberg, C ? ?CC: Left-sided weakness ? ?History is obtained from: Patient ? ?HPI: [name] is a 76 y.o. male who first noticed symptoms on Thursday evening, then subsequently on Friday morning had trouble getting out of bed.  Initially, he had significant dysarthria as well but that has improved some, but not resolved.  Due to the symptoms, he sought care in the emergency department where a CT was negative.  He cannot have a MRI because of pacemaker. ? ? ?LKW: thursday ?tpa given?: no, out of window ? ? ? ?ROS: A 14 point ROS was performed and is negative except as noted in the HPI.  ? ?Past Medical History:  ?Diagnosis Date  ? Anxiety   ? Arthritis   ? Atrial fibrillation ([place])   ? a. Dx 2013, recurred 02/2014, CHA2DS2VASc = 3 -->placed on Eliquis;  b. 02/2014 Echo: EF 50-55%, mid and apical anterior septum and mid and apical inf septum are abnl, mild to mod Ao sclerosis w/o AS.  ? Cancer associated pain   ? Chicken pox   ? Chronic lymphocytic leukemia ([place])   ? a. Dx 02/2014.  ? CLL (chronic lymphocytic leukemia) ([place])   ? Complication of anesthesia   ? History of  PTSD--do not touch patient when waking up from surgery.  ? COPD (chronic obstructive pulmonary disease) ([place])   ? Coronary artery disease   ? a. 04/2009 CABG x 3 (LIMA->LAD, VG->OM1, VG->PDA);  b. 09/2009 Cath: occluded VG x 2 w/ patent LIMA and L->R collats. EF 55%, mild antlat HK;  c. 10/2011 MV: EF 53%, no isch/infarct-->low risk.  ? Dysrhythmia   ? hx of a-fib  ? GERD (gastroesophageal reflux disease)   ? occasional  ? History of chemotherapy 2015-2016  ? HOH (hard of hearing)   ? Bilateral Hearing Aids  ? Hypertension   ? Myocardial infarction [facility]) 2010  ? OSA on CPAP   ? USE C-PAP  ? Presence of permanent cardiac pacemaker 2017  ? PTSD (post-traumatic stress disorder)   ? PTSD (post-traumatic stress disorder)   ? Pure hypercholesterolemia   ? Rheumatic fever  1959  ? 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 TIA (transient ischemic attack) 11/02/2015  ? ? ? ?Family History  ?Problem Relation Age of Onset  ? Heart disease Mother   ? Heart attack Mother   ? Coronary artery disease Other   ?     family history  ? ? ? ?Social History:  reports that he quit smoking about 15 years ago. His smoking use included cigarettes. He has a 40.00 pack-year smoking history. He has never used smokeless tobacco. He reports that he does not currently use alcohol. He reports that he does not use drugs. ? ? ?Exam: ?Current vital signs: ?BP 95/61 (BP Location: Right Arm)   Pulse 88   Temp 97.8 ?F (36.6 ?C)   Resp 19   Ht '5\' 9"'$  (1.753 m)   Wt 96.5 kg   SpO2 95%   BMI 31.42 kg/m?  ?Vital signs in last 24 hours: ?Temp:  [97.8 ?F (36.6 ?C)-98.4 ?F (36.9 ?C)] 97.8 ?F (36.6 ?C) (04/01 1139) ?Pulse Rate:  [66-88] 88 (04/01 1139) ?Resp:  [16-20] 19 (04/01 1139) ?BP: (95-124)/(57-77) 95/61 (04/01 1139) ?SpO2:  [91 %-98 %] 95 % (04/01 1139) ?Weight:  [96.5 kg] 96.5 kg (03/31 1412) ? ? ?Physical Exam  ?Constitutional: Appears well-developed and well-nourished.  ?Psych: Affect  appropriate to situation ?Eyes: No scleral injection ?HENT: No OP obstruction ?MSK: no joint deformities.  ?Cardiovascular: Normal rate and regular rhythm.  ?Respiratory: Effort normal, non-labored breathing ?GI: Soft.  No distension. There is no tenderness.  ?Skin: WDI ? ?Neuro: ?Mental Status: ?Patient is awake, alert, oriented to person, place, month, year, and situation. ?Patient is able to give a clear and coherent history. ?No signs of aphasia or neglect ?Cranial Nerves: ?II: Visual Fields are full. Pupils are equal, round, and reactive to light.   ?III,IV, VI: EOMI without ptosis or diploplia.  ?V: Facial sensation is symmetric to temperature ?VII: Facial movement is weak on the left ?VIII: hearing is intact to voice ?X: Uvula elevates symmetrically ?XI: Shoulder shrug is symmetric. ?XII: tongue is midline  without atrophy or fasciculations.  ?Motor: ?Tone is normal. Bulk is normal. 5/5 strength was present on the right side he has 4/5 weakness left arm and leg ?Sensory: ?Sensation is diminished throughout the left side  ?Cerebellar: ?No clear ataxia in the right ? ? ? ? ?I have reviewed labs in epic and the results pertinent to this consultation are: ?LDL 27 ?A1c 5.9 ? ?I have reviewed the images obtained: CTA head and neck-no significant stenosis ? ?Impression: 76 year old male with left-sided weakness in the setting of atrial fibrillation on anticoagulation.  At this time, I suspect that he has had a small ischemic stroke.  He already seems to be fairly maximized from a medical management standpoint.  There is no convincing evidence to suggest that switching from one anticoagulant to another in the setting of treatment failure provides any benefit.  At this time, I would plan to restart Eliquis 5 days from onset of symptoms (likely small given not seen on CT). ? ?Recommendations: ?1) resume Eliquis on Tuesday ?2) continue aspirin as a bridge in the interim, no need for antiplatelet therapy from neurological perspective after restarting Eliquis ?3) continue home statin ?4) PT, OT, ST ?5) neurology will be available on an as-needed basis. ? ? ?Roland Rack, MD ?[facility] ?[phone] ? ?If 7pm- 7am, please page neurology on call as listed in [place]. ? ?

## 2021-10-20 DIAGNOSIS — F4310 Post-traumatic stress disorder, unspecified: Secondary | ICD-10-CM | POA: Diagnosis not present

## 2021-10-20 DIAGNOSIS — I25118 Atherosclerotic heart disease of native coronary artery with other forms of angina pectoris: Secondary | ICD-10-CM | POA: Diagnosis not present

## 2021-10-20 DIAGNOSIS — I482 Chronic atrial fibrillation, unspecified: Secondary | ICD-10-CM | POA: Diagnosis not present

## 2021-10-20 DIAGNOSIS — C9110 Chronic lymphocytic leukemia of B-cell type not having achieved remission: Secondary | ICD-10-CM | POA: Diagnosis not present

## 2021-10-20 MED ORDER — TRAMADOL HCL 50 MG PO TABS
50.0000 mg | ORAL_TABLET | Freq: Two times a day (BID) | ORAL | Status: DC | PRN
  Administered 2021-10-20 – 2021-10-23 (×3): 50 mg via ORAL
  Filled 2021-10-20 (×4): qty 1

## 2021-10-20 NOTE — Progress Notes (Signed)
?PROGRESS NOTE ? ? ? ?LEODIS ALCOCER  JSE:[id number] DOB: 1946-03-31 DOA: 10/18/2021 ?PCP: Michael Haven, MD  ? ?Brief Narrative:  ?Per admitting MD: ?[name] is a 76 y.o. male with medical history significant for HTN, HFpEF, recent severe debilitation following COVID infection in January, atrial fibrillation on Eliquis, s/p PPM and CLL was in his USOH until yesterday when his wife noted that he was unable to get out of bed as per usual.  He had clear left-sided weakness and was having slurred speech.  Patient refused to come to the ED because he was tired of being in the hospital.  This morning, patient's speech had cleared however given continued left-sided weakness he agreed to come to the ED. ?  ?In the ED patient was noted to have marked weakness on his left with NIH SS score of 4.  Head CT was negative for any bleed however patient was unable to have MRI or MRA due to pacemaker. ?  ?Patient states he is very tired and sleepy.  Patient's wife notes that he has had significant lethargy since his COVID infection.  [place] now barely likes to get out of bed.  He sleeps most of the time apparently.  She denies that there is been any new medications.  Notes his lethargy seems to be worsening over time.  Patient and wife both deny any fevers or chills, no nausea or vomiting, no diarrhea, he apparently has a very good appetite.  No dysuria ? ?He has been evaluated by Neuro wth dx of CVA.  They did not feel transfer to [facility] for MRI in setting of pacer would change tx.  Also evaluated by inpatient rehab for possible placement. Neuro sx slowly improving ? ?Assessment & Plan: ?  ?Principal Problem: ?  CVA (cerebral vascular accident) ([name]) ?Active Problems: ?  Essential hypertension ?  CLL (chronic lymphocytic leukemia) ([place]) ?  PTSD (post-traumatic stress disorder) ?  Chronic diastolic CHF (congestive heart failure) ([place]) ?  CAD (coronary artery disease) ? ? ?CVA ?Seen  by neurology in consultation, did not feel transfer to [facility] for pacer from the MRI was necessary and that it would not change treatment plan ?We will continue to hold Eliquis per his recommendation with recommendation to restart Tuesday ?Discontinue aspirin after starting Eliquis ?CTA head and neck without acute finding ?Echocardiogram ordered-pending ?Inpatient rehab consult placed currently being evaluated for placement there ?  ?Lethargy ?Thought to be multifactorial related to long COVID and psychotropic medications ?Patient appears to be much more awake and alert on my evaluation ?No indication for acute intervention at this point ?Can follow-up outpatient with psychiatry ? ?PTSD ?Continue Remeron and trazodone as noted above ?Consider psychiatric input for possible change in medications ?  ?Atrial fibrillation ?Continue amiodarone ?Eliquis being held per neurology recommendations ?  ?HTN ?Blood pressure variable ?Most recent 61/58, previously 141/68 will not add any additional medications at this point ?  ?CLL ?Continue home medications ?  ?HFpEF/Cardiomyopathy ?Patient is euvolemic ?Continue Lasix ?Echocardiogram ordered as above ?  ?Long COVID ?Supportive care ?  ?COPD ?No evidence for acute flare ?  ?DVT prophylaxis: Lovenox SQ  ?Code Status: FULL ? ?  ?Code Status Orders  ?(From admission, onward)  ?  ? ? ?  ? ?  Start     Ordered  ? 10/18/21 1644  Full code  Continuous       ? 10/18/21 1655  ? ?  ?  ? ?  ? ?  Code Status History   ? ? Date Active Date Inactive Code Status Order ID Comments User Context  ? 09/13/2021 1830 09/19/2021 2353 DNR [id number]  Ivor Costa, MD ED  ? 09/13/2021 1548 09/13/2021 1830 Full Code [id number]  Ivor Costa, MD ED  ? 09/03/2021 2358 09/06/2021 1846 Full Code [id number]  Cox, Amy Delane Ginger, DO ED  ? 02/04/2021 0855 02/04/2021 1605 Full Code [id number]  End, Harrell Gave, MD Inpatient  ? 03/14/2020 2040 03/18/2020 1738 Full Code [id number]  Clarnce Flock, MD ED  ? 02/03/2020 1539 02/04/2020 1917 Full  Code [id number]  Ivor Costa, MD ED  ? 11/04/2017 1141 11/06/2017 1803 Full Code [id number]  Hooten, Laurice Record, MD Inpatient  ? 10/22/2016 1641 10/24/2016 1330 Full Code [id number]  Dereck Leep, MD Inpatient  ? 02/29/2016 1338 03/04/2016 1627 Full Code [id number]  Erma Heritage, PA Inpatient  ? 02/26/2016 1243 02/29/2016 1216 Full Code [id number]  Dustin Flock, MD ED  ? 11/02/2015 1434 11/03/2015 1734 Full Code [id number]  Gladstone Lighter, MD ED  ? 08/22/2015 1943 08/23/2015 1832 Full Code [id number]  Nicholes Mango, MD Inpatient  ? ?  ? ?Advance Directive Documentation   ? ?Flowsheet Row Most Recent Value  ?Type of Advance Directive Healthcare Power of [place], Living will  ?Pre-existing out of facility DNR order (yellow form or pink MOST form) --  ?"MOST" Form in Place? --  ? ?  ? ?Family Communication: discussed with patient today, wife Saturday, in agreement with plan of care ?Disposition Plan:   No safe d/c plan, hi fall risk, being evaluated for rehab ?Consults called: None ?Admission status: Inpatient ? ? ?Consultants:  ?neuro ? ?Procedures:  ?DG Chest 2 View ? ?Result Date: 10/18/2021 ?CLINICAL DATA:  Shortness of breath EXAM: CHEST - 2 VIEW COMPARISON:  Chest x-ray 09/13/2021 FINDINGS: Heart size and mediastinum are stable and within normal limits. Cardiac surgical changes and median sternotomy wires. Left-sided cardiac device. Linear opacities again seen in the left lower lung zone likely representing scarring/chronic subsegmental atelectasis. No new consolidation identified. No pleural effusion or pneumothorax. IMPRESSION: Stable appearing chronic changes with no acute process identified. Electronically Signed   By: Ofilia Neas M.D.   On: 10/18/2021 14:04  ? ?CT HEAD WO CONTRAST ? ?Result Date: 10/18/2021 ?CLINICAL DATA:  Shortness of breath, generalized weakness EXAM: CT HEAD WITHOUT CONTRAST TECHNIQUE: Contiguous axial images were obtained from the base of the skull through the vertex without intravenous  contrast. RADIATION DOSE REDUCTION: This exam was performed according to the departmental dose-optimization program which includes automated exposure control, adjustment of the mA and/or kV according to patient size and/or use of iterative reconstruction technique. COMPARISON:  09/13/2021 FINDINGS: Brain: No acute intracranial findings are seen. Ventricles are not dilated. Cavum septum pellucidum and cavum septum vergae are seen. There is no shift of midline structures. Cortical sulci are prominent. Vascular: There are scattered arterial calcifications. Skull: Unremarkable. Sinuses/Orbits: There is mucosal thickening in the ethmoid, maxillary and sphenoid sinuses. Other: None IMPRESSION: No acute intracranial findings are seen in noncontrast CT brain. Atrophy. Chronic sinusitis. Electronically Signed   By: Elmer Picker M.D.   On: 10/18/2021 13:48  ? ?CT ANGIO HEAD NECK W WO CM (CODE STROKE) ? ?Result Date: 10/18/2021 ?CLINICAL DATA:  Stroke suspected EXAM: CT ANGIOGRAPHY HEAD AND NECK TECHNIQUE: Multidetector CT imaging of the head and neck was performed using the standard protocol during bolus administration of intravenous contrast. Multiplanar CT image reconstructions and MIPs were obtained to evaluate the vascular anatomy.  Carotid stenosis measurements (when applicable) are obtained utilizing NASCET criteria, using the distal internal carotid diameter as the denominator. RADIATION DOSE REDUCTION: This exam was performed according to the departmental dose-optimization program which includes automated exposure control, adjustment of the mA and/or kV according to patient size and/or use of iterative reconstruction technique. CONTRAST:  51m OMNIPAQUE IOHEXOL 350 MG/ML SOLN COMPARISON:  CTA head neck 11/02/2015, correlation is also made with CT head 10/18/2021 FINDINGS: CT HEAD FINDINGS For noncontrast findings, please see same day CT head. CTA NECK FINDINGS Aortic arch: Standard branching. Imaged portion shows  no evidence of aneurysm or dissection. No significant stenosis of the major arch vessel origins. Aortic atherosclerosis Right carotid system: No evidence of dissection, stenosis (50% or greater) or occlusio

## 2021-10-20 NOTE — Plan of Care (Signed)
°  Problem: Education: °Goal: Knowledge of General Education information will improve °Description: Including pain rating scale, medication(s)/side effects and non-pharmacologic comfort measures °Outcome: Progressing °  °Problem: Health Behavior/Discharge Planning: °Goal: Ability to manage health-related needs will improve °Outcome: Progressing °  °Problem: Clinical Measurements: °Goal: Ability to maintain clinical measurements within normal limits will improve °Outcome: Progressing °Goal: Will remain free from infection °Outcome: Progressing °Goal: Diagnostic test results will improve °Outcome: Progressing °Goal: Respiratory complications will improve °Outcome: Progressing °Goal: Cardiovascular complication will be avoided °Outcome: Progressing °  °Problem: Activity: °Goal: Risk for activity intolerance will decrease °Outcome: Progressing °  °Problem: Nutrition: °Goal: Adequate nutrition will be maintained °Outcome: Progressing °  °Problem: Coping: °Goal: Level of anxiety will decrease °Outcome: Progressing °  °Problem: Elimination: °Goal: Will not experience complications related to bowel motility °Outcome: Progressing °Goal: Will not experience complications related to urinary retention °Outcome: Progressing °  °Problem: Pain Managment: °Goal: General experience of comfort will improve °Outcome: Progressing °  °Problem: Safety: °Goal: Ability to remain free from injury will improve °Outcome: Progressing °  °Problem: Skin Integrity: °Goal: Risk for impaired skin integrity will decrease °Outcome: Progressing °  °Problem: Education: °Goal: Knowledge of disease or condition will improve °Outcome: Progressing °Goal: Knowledge of secondary prevention will improve (SELECT ALL) °Outcome: Progressing °Goal: Knowledge of patient specific risk factors will improve (INDIVIDUALIZE FOR PATIENT) °Outcome: Progressing °Goal: Individualized Educational Video(s) °Outcome: Progressing °  °Problem: Coping: °Goal: Will verbalize  positive feelings about self °Outcome: Progressing °Goal: Will identify appropriate support needs °Outcome: Progressing °  °Problem: Health Behavior/Discharge Planning: °Goal: Ability to manage health-related needs will improve °Outcome: Progressing °  °Problem: Self-Care: °Goal: Ability to participate in self-care as condition permits will improve °Outcome: Progressing °Goal: Verbalization of feelings and concerns over difficulty with self-care will improve °Outcome: Progressing °Goal: Ability to communicate needs accurately will improve °Outcome: Progressing °  °Problem: Nutrition: °Goal: Risk of aspiration will decrease °Outcome: Progressing °Goal: Dietary intake will improve °Outcome: Progressing °  °Problem: Intracerebral Hemorrhage Tissue Perfusion: °Goal: Complications of Intracerebral Hemorrhage will be minimized °Outcome: Progressing °  °Problem: Ischemic Stroke/TIA Tissue Perfusion: °Goal: Complications of ischemic stroke/TIA will be minimized °Outcome: Progressing °  °Problem: Spontaneous Subarachnoid Hemorrhage Tissue Perfusion: °Goal: Complications of Spontaneous Subarachnoid Hemorrhage will be minimized °Outcome: Progressing °  °

## 2021-10-20 NOTE — Progress Notes (Addendum)
Physical Therapy Treatment ?Patient Details ?Name: [name] ?MRN: [id number] ?DOB: 1946-03-07 ?Today's Date: 10/20/2021 ? ? ?History of Present Illness Pt is a 76 year old male admitted with with left-sided weakness and also slurred speech. PMH significant for history of A-fib on Eliquis, CLL, COPD, CAD status post CABG, OSA on CPAP, hypertension, hyperlipidemia, sick sinus syndrome status post pacemaker who presents for evaluation of left-sided weakness.  Patient has had chronic shortness of breath since January when he had COVID but denies any acute changes. ? ?  ?PT Comments  ? ? Pt awake and alert resting in bed w/ daughter at bedside upon PT entrance into room today. Pt denies any c/o pain at rest and is willing to work w/ PT today. He is able to perform bed mobility w/ minA and reliance on bed rails. Once seated EOB he is able to progress to standing w/ modA using RW to step-pivot transfer to recliner; recliner was wheeled out into hallway in order for patient to focus on ambulation w/o significant obstacles present. He is able to ambulate ~5f w/ minA using RW, did request a standing rest break after about ~354f  Another bout of ~4063fere performed after a PT led rest break for ~2mi32mollowing the first bout. Pt back in recliner w/ all needs w/in reach prior to PT exiting room. Pt will benefit from continued skilled PT in order to increase LE strength/endurance, improve functional mobility/coordination, and restore PLOF. Current discharge recommendation remains appropriate due to the level of assistance required by the patient to ensure safety and improve overall function. ?  ?Recommendations for follow up therapy are one component of a multi-disciplinary discharge planning process, led by the attending physician.  Recommendations may be updated based on patient status, additional functional criteria and insurance authorization. ? ?Follow Up Recommendations ? Acute inpatient rehab (3hours/day) ?  ?   ?Assistance Recommended at Discharge Frequent or constant Supervision/Assistance  ?Patient can return home with the following A little help with walking and/or transfers;A little help with bathing/dressing/bathroom;Assistance with cooking/housework;Assist for transportation;Help with stairs or ramp for entrance ?  ?Equipment Recommendations ? Rolling walker (2 wheels)  ?  ?Recommendations for Other Services   ? ? ?  ?Precautions / Restrictions Precautions ?Precautions: Fall ?Restrictions ?Weight Bearing Restrictions: No  ?  ? ?Mobility ? Bed Mobility ?Overal bed mobility: Needs Assistance ?Bed Mobility: Supine to Sit ?  ?  ?Supine to sit: HOB elevated, Min assist ?  ?  ?General bed mobility comments: completes bed mobility w/ minA reliance on bed rails; increased time ?  ? ?Transfers ?Overall transfer level: Needs assistance ?Equipment used: Rolling walker (2 wheels) ?Transfers: Sit to/from Stand, Bed to chair/wheelchair/BSC ?  ? Sit <> Stand: modA ?Step pivot transfers: Min assist ?  ?  ?  ?General transfer comment: minA due to L-sided weakness ?  ? ?Ambulation/Gait ?Ambulation/Gait assistance: Min assist, Min guard ?Gait Distance (Feet): 80 Feet (2 bouts of 40ft59fssistive device: Rolling walker (2 wheels) ?  ?Gait velocity: decreased ?  ?  ?  ? ? ?Stairs ?  ?  ?  ?  ?  ? ? ?Wheelchair Mobility ?  ? ?Modified Rankin (Stroke Patients Only) ?  ? ? ?  ?Balance Overall balance assessment: Needs assistance ?Sitting-balance support: Feet supported ?Sitting balance-Leahy Scale: Fair ?  ?  ?Standing balance support: Bilateral upper extremity supported, During functional activity, Reliant on assistive device for balance ?Standing balance-Leahy Scale: Fair ?  ?  ?  ?  ?  ?  ?  ?  ?  ?  ?  ?  ?  ? ?  ?  Cognition Arousal/Alertness: Awake/alert ?Behavior During Therapy: [facility] for tasks assessed/performed ?Overall Cognitive Status: Impaired/Different from baseline ?  ?  ?  ?  ?  ?  ?  ?  ?  ?  ?  ?  ?  ?  ?  ?  ?  ?  ?  ? ?   ?Exercises   ? ?  ?General Comments   ?  ?  ? ?Pertinent Vitals/Pain Pain Assessment ?Pain Assessment: No/denies pain  ? ? ?Home Living   ?  ?  ?  ?  ?  ?  ?  ?  ?  ?   ?  ?Prior Function    ?  ?  ?   ? ?PT Goals (current goals can now be found in the care plan section) Progress towards PT goals: Progressing toward goals ? ?  ?Frequency ? ? ? 7X/week ? ? ? ?  ?PT Plan Current plan remains appropriate  ? ? ?Co-evaluation   ?  ?  ?  ?  ? ?  ?AM-PAC PT "6 Clicks" Mobility   ?Outcome Measure ? Help needed turning from your back to your side while in a flat bed without using bedrails?: A Little ?Help needed moving from lying on your back to sitting on the side of a flat bed without using bedrails?: A Little ?Help needed moving to and from a bed to a chair (including a wheelchair)?: A Little ?Help needed standing up from a chair using your arms (e.g., wheelchair or bedside chair)?: A Little ?Help needed to walk in hospital room?: A Little ?Help needed climbing 3-5 steps with a railing? : Total ?6 Click Score: 16 ? ?  ?End of Session Equipment Utilized During Treatment: Gait belt ?Activity Tolerance: Patient tolerated treatment well;Patient limited by fatigue ?Patient left: in chair;with chair alarm set;with call bell/phone within reach ?Nurse Communication: Mobility status ?PT Visit Diagnosis: Unsteadiness on feet (R26.81);History of falling (Z91.81);Muscle weakness (generalized) (M62.81) ?  ? ? ?Time: 1434-1500 ?PT Time Calculation (min) (ACUTE ONLY): 26 min ? ?Charges:  $Gait Training: 8-22 mins ?$Therapeutic Exercise: 8-22 mins          ?          ? ? ?Jonnie Kind, SPT ?10/20/2021, 3:25 PM ? ?

## 2021-10-20 NOTE — Progress Notes (Signed)
Inpatient Rehab Admissions: ? ?Inpatient Rehab Consult received.  I spoke with patient on the telephone for rehabilitation assessment and to discuss goals and expectations of an inpatient rehab admission.  Pt acknowledged understanding of CIR goals and expectations. Pt is interested in pursuing CIR. Pt gave permission to contact wife, Sheria Lang. Spoke with wife on the phone. She is supportive of pt pursuing CIR. She confirmed that she will be able to provide 24/7 supervision and Min A physically for pt after discharge. Will continue to follow. ? ?Signed: ?Gayland Curry, MS, CCC-SLP ?Admissions Coordinator ?[phone] ? ? ?

## 2021-10-20 NOTE — Progress Notes (Signed)
Occupational Therapy Treatment ?Patient Details ?Name: [name] ?MRN: [id number] ?DOB: 1945/12/21 ?Today's Date: 10/20/2021 ? ? ?History of present illness Pt is a 76 year old male admitted with with left-sided weakness and also slurred speech. PMH significant for history of A-fib on Eliquis, CLL, COPD, CAD status post CABG, OSA on CPAP, hypertension, hyperlipidemia, sick sinus syndrome status post pacemaker who presents for evaluation of left-sided weakness.  Patient has had chronic shortness of breath since January when he had COVID but denies any acute changes. ?  ?OT comments ? Pt seen for OT treatment on this date. Upon arrival to room, pt sitting upright in recliner following PT session. No family member in room but supportive son on phone and providing encouragement for pt to participate in OT tx. Pt reporting no pain and agreeable to OT tx. Pt continues to present with decreased LUE strength, decreased balance, and decreased activity tolerance, however is steadily progressing towards goals. This date, pt required MOD A for sit>stand transfers, MIN GUARD for functional mobility of short household distances (69f, 2x) with RW, and MIN A for standing grooming tasks. Following x2 standing grooming tasks, pt appeared fatigued however vitals WNL (see below).  ? ?At end of session, pt engaged in seated LUE HEP consisting of x5 reps of shoulder flexion to grab deodorant from window ledge and x5 reps of twisting deodorant lid on/off. Pt instructed to independently perform 3x per day to promote LUE strength and coordination; pt verbalized understanding. Pt continues to benefit from skilled OT services to maximize return to PLOF and minimize risk of future falls, injury, caregiver burden, and readmission. Will continue to follow POC. Discharge recommendation remains appropriate.    ? ?Recommendations for follow up therapy are one component of a multi-disciplinary discharge planning process, led by the attending  physician.  Recommendations may be updated based on patient status, additional functional criteria and insurance authorization. ?   ?Follow Up Recommendations ? Acute inpatient rehab (3hours/day)  ?  ?   ?Patient can return home with the following ? A lot of help with walking and/or transfers;A lot of help with bathing/dressing/bathroom;Assist for transportation;Assistance with cooking/housework;Direct supervision/assist for financial management;Help with stairs or ramp for entrance;Direct supervision/assist for medications management ?  ?Equipment Recommendations ? Other (comment) (pt has recommended equipment, defer to next venue of care)  ?  ?   ?Precautions / Restrictions Precautions ?Precautions: Fall ?Restrictions ?Weight Bearing Restrictions: No  ? ? ?  ? ?Mobility Bed Mobility ?  ?  ?  ?  ?  ?  ?  ?General bed mobility comments: not assessed, pt in recliner pre/post session ?  ? ?Transfers ?Overall transfer level: Needs assistance ?Equipment used: Rolling walker (2 wheels) ?Transfers: Sit to/from Stand ?Sit to Stand: Mod assist ?  ?  ?  ?  ?  ?General transfer comment: x2 bouts; requires MOD A for upward momentum and for steadying upon standing ?  ?  ?Balance Overall balance assessment: Needs assistance ?Sitting-balance support: No upper extremity supported, Feet supported ?Sitting balance-Leahy Scale: Fair ?  ?  ?Standing balance support: No upper extremity supported, During functional activity ?Standing balance-Leahy Scale: Fair ?Standing balance comment: Requires MIN GUARD during sinkside grooming tasks ?  ?  ?  ?  ?  ?  ?  ?  ?  ?  ?  ?   ? ?ADL either performed or assessed with clinical judgement  ? ?ADL Overall ADL's : Needs assistance/impaired ?  ?  ?Grooming: Wash/dry hands;Applying  deodorant;Minimal assistance;Standing ?Grooming Details (indicate cue type and reason): Requires MIN A to assist with reaching for grooming items outside of reach d/t decreased shoulder flexion in LUE ?  ?  ?  ?  ?  ?  ?   ?  ?  ?  ?  ?  ?  ?  ?Functional mobility during ADLs: Min guard;Rolling walker (2 wheels) (to walk 12f, 2x) ?  ?  ? ? ? ?Cognition Arousal/Alertness: Awake/alert ?Behavior [facility]: [facility] ?Overall Cognitive Status: No family/caregiver present to determine baseline cognitive functioning ?  ?  ?  ?  ?  ?  ?  ?  ?  ?  ?  ?  ?Following Commands: Follows one step commands with increased time, Follows multi-step commands inconsistently ?  ?  ?  ?General Comments: A&Ox4. Requires increased processing time ?  ?  ?   ?Exercises Other Exercises ?Other Exercises: Pt engaged in seated LUE HEP consisting of x5 reps of shoulder flexion to grab deodorant from window ledge and x5 reps of twisting deodorant lid on/off. Pt instructed to perform 3x per day to promote LUE strength and coordination. Pt verbalized understanding ? ?  ?   ?General Comments HR 80s-90s, SpO2>92% throughout. BP 111/60 at end of session  ? ? ?Pertinent Vitals/ Pain       Pain Assessment ?Pain Assessment: No/denies pain ? ?   ?   ? ?Frequency ? Min 3X/week  ? ? ? ? ?  ?Progress Toward Goals ? ?OT Goals(current goals can now be found in the care plan section) ? Progress towards OT goals: Progressing toward goals ? ?Acute Rehab OT Goals ?Patient Stated Goal: to get stronger ?OT Goal Formulation: With patient/family ?Time For Goal Achievement: 11/02/21 ?Potential to Achieve Goals: Good  ?Plan Discharge plan remains appropriate;Frequency remains appropriate   ? ?   ?AM-PAC OT "6 Clicks" Daily Activity     ?Outcome Measure ? ? Help from another person eating meals?: None ?Help from another person taking care of personal grooming?: A Little ?Help from another person toileting, which includes using toliet, bedpan, or urinal?: A Lot ?Help from another person bathing (including washing, rinsing, drying)?: A Lot ?Help from another person to put on and taking off regular upper body clothing?: A Lot ?Help from another person to put on  and taking off regular lower body clothing?: A Little ?6 Click Score: 16 ? ?  ?End of Session Equipment Utilized During Treatment: Rolling walker (2 wheels) ? 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  ?Activity Tolerance Patient tolerated treatment well ?  ?Patient Left in chair;with call bell/phone within reach;with chair alarm set ?  ?Nurse Communication Mobility status ?  ? ?   ? ?Time: [address]?OT Time Calculation (min): 30 min ? ?Charges: OT General Charges ?$OT Visit: 1 Visit ?OT Treatments ?$Self Care/Home Management : 8-22 mins ?$Therapeutic Activity: 8-22 mins ? ?LFredirick Maudlin OTR/L ?[place]? ?

## 2021-10-20 NOTE — PMR Pre-admission (Signed)
PMR Admission Coordinator Pre-Admission Assessment ? ?Patient: [name] is an 76 y.o., male ?MRN: [id number] ?DOB: 01/06/1946 ?Height: _0  (175.3 cm) ?Weight: 96.5 kg ? ?Insurance Information ?HMO: yes    PPO:      PCP:      IPA:      80/20:      OTHER:  ?PRIMARY: [employer] Medicare      Policy#: [id number]      Subscriber: patient ?CM Name: Arville Go   Phone#: [phone]     Fax#: [phone] ?Pre-Cert#: [id number]   approved for 7 days with f/u with Dewitt Rota phone [phone] fax [phone]   Employer:  ?Benefits:  Phone #: [phone]     Name: 4/3 ?Eff. Date: 07/21/20-still active     Deduct: does not have one      Out of Pocket Max: $1,000 ($460 met) ?Life Max: NA ?CIR: $150 co-pay/admission      SNF: 100% coverage ?Outpatient: $10-20/visit co-pay     Co-Pay:  ?Home Health: 100% coverage      Co-Pay:  ?DME: 100% coverage     Co-Pay:  ?Providers: in-network ?SECONDARY:       Policy#:      Phone#:  ? ?Financial Counselor:       Phone#:  ? ?The ?Data Collection Information Summary? for patients in Inpatient Rehabilitation Facilities with attached ?Privacy Act [place] Records? was provided and verbally reviewed with: Patient and Family ? ?Emergency Contact Information ?Contact Information   ? ? Name Relation Home Work Mobile  ? [place] [phone]  [phone]  ? Scott,Dana Daughter [phone]  [phone]  ? ?  ? ?Current Medical History  ?Patient Admitting Diagnosis: CVA ? ?History of Present Illness: 76 year old male with history for HTN, HF pEF, recent severe debilitation after COVID infection in January, Atrial fibrillation on Eliquis, s/p PPM and CLL who presented on 10/18/21 when his wife noted he was unable to get out of bed, left sided weakness and slurred speech. Head CT was negative for bleed however unable to have an MRI or MRA due to his pacemaker. ? ?Neurology consulted with diagnosis of CVA. Did not transfer to [facility] for MRI in setting of pacer for felt would  not change his treatment. CTA head and neck without acute abnormality or large vessel occlusion. A1c of 5.9. LDL 39. Plans to discontinue ASA after resuming his Eliquis. ? ?Developed substernal chest pain increased with deep breathing and body movement. EKG negative. Troponins negative. Felt to be pleuritic and musculoskeletal component.  ? ?HTN within goal and to continue Imdur. Chronic diastolic CHF appears euvolemic. Continue home amiodarone for atrial fibrillation. CLL managed by outpatient oncology. Currently chronic meds are begin held since he had COVID. To follow up with oncology as an outpatient.  ? ?PTSD on home dose of Remeron and Trazodone. ? ?Complete NIHSS TOTAL: 5 ? ?Patient's medical record from [facility] has been reviewed by the rehabilitation admission coordinator and physician. ? ?Past Medical History  ?Past Medical History:  ?Diagnosis Date  ? Anxiety   ? Arthritis   ? Atrial fibrillation ([place])   ? a. Dx 2013, recurred 02/2014, CHA2DS2VASc = 3 -->placed on Eliquis;  b. 02/2014 Echo: EF 50-55%, mid and apical anterior septum and mid and apical inf septum are abnl, mild to mod Ao sclerosis w/o AS.  ? Cancer associated pain   ? Chicken pox   ? Chronic lymphocytic leukemia ([place])   ? a. Dx 02/2014.  ?  CLL (chronic lymphocytic leukemia) ([place])   ? Complication of anesthesia   ? History of  PTSD--do not touch patient when waking up from surgery.  ? COPD (chronic obstructive pulmonary disease) ([place])   ? Coronary artery disease   ? a. 04/2009 CABG x 3 (LIMA->LAD, VG->OM1, VG->PDA);  b. 09/2009 Cath: occluded VG x 2 w/ patent LIMA and L->R collats. EF 55%, mild antlat HK;  c. 10/2011 MV: EF 53%, no isch/infarct-->low risk.  ? Dysrhythmia   ? hx of a-fib  ? GERD (gastroesophageal reflux disease)   ? occasional  ? History of chemotherapy 2015-2016  ? HOH (hard of hearing)   ? Bilateral Hearing Aids  ? Hypertension   ? Myocardial infarction [facility]) 2010  ? OSA on CPAP   ? USE C-PAP  ? Presence  of permanent cardiac pacemaker 2017  ? PTSD (post-traumatic stress disorder)   ? PTSD (post-traumatic stress disorder)   ? Pure hypercholesterolemia   ? Rheumatic fever 1959  ? 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 TIA (transient ischemic attack) 11/02/2015  ? ?Has the patient had major surgery during 100 days prior to admission? No ? ?Family History   ?family history includes Coronary artery disease in an other family member; Heart attack in his mother; Heart disease in his mother. ? ?Current Medications ? ?Current Facility-Administered Medications:  ?  acetaminophen (TYLENOL) tablet 650 mg, 650 mg, Oral, Q4H PRN, 650 mg at 10/22/21 2135 **OR** acetaminophen (TYLENOL) 160 MG/5ML solution 650 mg, 650 mg, Per Tube, Q4H PRN **OR** acetaminophen (TYLENOL) suppository 650 mg, 650 mg, Rectal, Q4H PRN, Jamse Arn, Kyra Searles, MD ?  acyclovir (ZOVIRAX) 200 MG capsule 400 mg, 400 mg, Oral, BID, Bonnell Public Tublu, MD, 400 mg at 10/23/21 0847 ?  albuterol (PROVENTIL) (2.5 MG/3ML) 0.083% nebulizer solution 3 mL, 3 mL, Inhalation, Q6H PRN, Vashti Hey, MD ?  amiodarone (PACERONE) tablet 100 mg, 100 mg, Oral, BID, Bonnell Public Tublu, MD, 100 mg at 10/23/21 0847 ?  apixaban (ELIQUIS) tablet 5 mg, 5 mg, Oral, BID, Lorella Nimrod, MD, 5 mg at 10/23/21 0848 ?  ezetimibe (ZETIA) tablet 10 mg, 10 mg, Oral, Daily, Bonnell Public Tublu, MD, 10 mg at 10/23/21 0848 ?  feeding supplement (BOOST / RESOURCE BREEZE) liquid 1 Container, 1 Container, Oral, TID BM, Lorella Nimrod, MD, 1 Container at 10/23/21 0845 ?  furosemide (LASIX) tablet 40 mg, 40 mg, Oral, Daily, Bonnell Public Tublu, MD, 40 mg at 10/23/21 0848 ?  guaiFENesin-dextromethorphan (ROBITUSSIN DM) 100-10 MG/5ML syrup 5 mL, 5 mL, Oral, Q4H PRN, Lorella Nimrod, MD, 5 mL at 10/23/21 0846 ?  isosorbide mononitrate (IMDUR) 24 hr tablet 30 mg, 30 mg, Oral, Daily, Bonnell Public Tublu, MD, 30 mg at 10/23/21 0846 ?  mirtazapine  (REMERON) tablet 15 mg, 15 mg, Oral, QHS PRN, Vashti Hey, MD, 15 mg at 10/22/21 2134 ?  mometasone-formoterol (DULERA) 200-5 MCG/ACT inhaler 2 puff, 2 puff, Inhalation, BID, Bonnell Public Tublu, MD, 2 puff at 10/23/21 0849 ?  nitroGLYCERIN (NITROSTAT) SL tablet 0.4 mg, 0.4 mg, Sublingual, Q5 min PRN, Lorella Nimrod, MD, 0.4 mg at 10/21/21 0948 ?  tiotropium (SPIRIVA) inhalation capsule ([facility] use ONLY) 18 mcg, 1 capsule, Inhalation, QHS, Chatterjee, Dewaine Oats Tublu, MD ?  traMADol (ULTRAM) tablet 50 mg, 50 mg, Oral, Q12H PRN, Marcell Anger, MD, 50 mg at 10/23/21 0846 ? ?Patients Current Diet:  ?Diet Order   ? ?       ?  Diet Heart Room  service appropriate? Yes; Fluid consistency: Thin  Diet effective now       ?  ? ?  ?  ? ?  ? ?Precautions / Restrictions ?Precautions ?Precautions: Fall ?Restrictions ?Weight Bearing Restrictions: No  ? ?Has the patient had 2 or more falls or a fall with injury in the past year? Yes ? ?Prior Activity Level ?Limited Community (1-2x/wk): gets out of house ~1day/week ? ?Prior Functional Level ?Self Care: Did the patient need help bathing, dressing, using the toilet or eating? Independent ? ?Indoor Mobility: Did the patient need assistance with walking from room to room (with or without device)? Independent ? ?Stairs: Did the patient need assistance with internal or external stairs (with or without device)? Independent ? ?Functional Cognition: Did the patient need help planning regular tasks such as shopping or remembering to take medications? Independent ? ?Patient Information ?Are you of Hispanic, Latino/a,or Spanish origin?: A. No, not of Hispanic, Latino/a, or Spanish origin ?What is your race?: A. White ?Do you need or want an interpreter to communicate with a doctor or health care staff?: 0. No ? ?Patient's Response To:  ?Health Literacy and Transportation ?Is the patient able to respond to health literacy and transportation needs?: Yes ?Health Literacy  - How often do you need to have someone help you when you read instructions, pamphlets, or other written material from your doctor or pharmacy?: Never ?In the past 12 months, has lack of transportatio

## 2021-10-21 ENCOUNTER — Telehealth: Payer: Self-pay | Admitting: *Deleted

## 2021-10-21 ENCOUNTER — Telehealth: Payer: Self-pay | Admitting: Cardiovascular Disease

## 2021-10-21 DIAGNOSIS — I5032 Chronic diastolic (congestive) heart failure: Secondary | ICD-10-CM | POA: Diagnosis not present

## 2021-10-21 DIAGNOSIS — R079 Chest pain, unspecified: Secondary | ICD-10-CM | POA: Diagnosis not present

## 2021-10-21 DIAGNOSIS — I480 Paroxysmal atrial fibrillation: Secondary | ICD-10-CM

## 2021-10-21 DIAGNOSIS — I6389 Other cerebral infarction: Secondary | ICD-10-CM | POA: Diagnosis not present

## 2021-10-21 DIAGNOSIS — C9110 Chronic lymphocytic leukemia of B-cell type not having achieved remission: Secondary | ICD-10-CM | POA: Diagnosis not present

## 2021-10-21 LAB — TROPONIN I (HIGH SENSITIVITY)
Troponin I (High Sensitivity): 8 ng/L (ref <18)
Troponin I (High Sensitivity): 8 ng/L (ref <18)

## 2021-10-21 MED ORDER — NITROGLYCERIN 0.4 MG SL SUBL
SUBLINGUAL_TABLET | SUBLINGUAL | Status: AC
  Administered 2021-10-21: 0.4 mg via SUBLINGUAL
  Filled 2021-10-21: qty 1

## 2021-10-21 MED ORDER — NITROGLYCERIN 0.4 MG SL SUBL
0.4000 mg | SUBLINGUAL_TABLET | SUBLINGUAL | Status: DC | PRN
  Administered 2021-10-21 (×2): 0.4 mg via SUBLINGUAL
  Filled 2021-10-21 (×2): qty 1

## 2021-10-21 MED ORDER — ALIROCUMAB 150 MG/ML ~~LOC~~ SOAJ
150.0000 mg | SUBCUTANEOUS | Status: AC
  Administered 2021-10-21: 150 mg via SUBCUTANEOUS

## 2021-10-21 MED ORDER — GUAIFENESIN-DM 100-10 MG/5ML PO SYRP
5.0000 mL | ORAL_SOLUTION | ORAL | Status: DC | PRN
  Administered 2021-10-22 – 2021-10-23 (×2): 5 mL via ORAL
  Filled 2021-10-21 (×2): qty 10

## 2021-10-21 NOTE — Telephone Encounter (Signed)
Mrs Coole called to report that patient had a stroke and is in the hospital, She thought he had an appointment Wednesday and wanted to cancel it, but I informed her that he has nothing scheduled. She said that he is going to go to [facility] when he is discharged but is not sure when that will be. She is also asking when he is to restart his cancer medicine that was put on hold when he got COVID. Please advise ?

## 2021-10-21 NOTE — Assessment & Plan Note (Signed)
Blood pressure within goal. ?-Continue Imdur ?

## 2021-10-21 NOTE — Assessment & Plan Note (Signed)
-   Being managed by outpatient oncology ?

## 2021-10-21 NOTE — Progress Notes (Signed)
?Progress Note ? ? ?Patient: [name] DGL:[id number] DOB: 06/19/1946 DOA: 10/18/2021     3 ?DOS: the patient was seen and examined on 10/21/2021 ?  ?Brief hospital course: ?Taken from prior notes. ? ?[name] is a 76 y.o. male with medical history significant for HTN, HFpEF, recent severe debilitation following COVID infection in January, atrial fibrillation on Eliquis, s/p PPM and CLL was in his USOH until yesterday when his wife noted that he was unable to get out of bed as per usual.  He had clear left-sided weakness and was having slurred speech.  Patient refused to come to the ED because he was tired of being in the hospital.  This morning, patient's speech had cleared however given continued left-sided weakness he agreed to come to the ED. ?  ?In the ED patient was noted to have marked weakness on his left with NIH SS score of 4.  Head CT was negative for any bleed however patient was unable to have MRI or MRA due to pacemaker. ?  ?Patient states he is very tired and sleepy.  Patient's wife notes that he has had significant lethargy since his COVID infection.  [place] now barely likes to get out of bed.  He sleeps most of the time apparently.  She denies that there is been any new medications.  Notes his lethargy seems to be worsening over time.  Patient and wife both deny any fevers or chills, no nausea or vomiting, no diarrhea, he apparently has a very good appetite.  No dysuria ?  ?He has been evaluated by Neuro wth dx of CVA.  They did not feel transfer to [facility] for MRI in setting of pacer would change tx.   ? ?PT/OT is recommending CIR-pending insurance authorization. ? ?4/3: Patient developed substernal chest pain this morning, EKG without any acute changes and troponin x2 remains negative.  Pain feels like pressure but increased with deep breathing and movement.  Most likely atypical with some pleuritic and musculoskeletal component. ? ? ?Assessment and  Plan: ?* CVA (cerebral vascular accident) ([place]) ?CT head was negative, unable to obtain MRI due to pacemaker placement.  Neurology is treating like a stroke, does not think that transfer to [facility] for MRI is necessary.Marland Kitchen ?PT/OT is recommending CIR-pending insurance authorization. ?CTA head and neck was without any acute abnormality or large vessel occlusion. ?A1c of 5.9 ?Lipid profile with LDL of 39 ?Will resume home dose of Eliquis from tomorrow ?We will discontinue aspirin after resuming Eliquis ? ?Chest pain ?Patient developed substernal chest pain this morning, feels like pressure, nonradiating, some associated shortness of breath.  Pain increased with deep breathing and body movement. ?EKG done which was without any acute abnormality ?Check troponin-remain negative x2 ?Most likely atypical chest pain as it also has pleuritic and musculoskeletal component. ?-Continue to monitor ? ?CAD (coronary artery disease) ?Patient did had some chest pain this morning but it seems atypical with pleuritic and musculoskeletal component. ?-Continue aspirin and statin. ?-Discontinue aspirin after starting Eliquis tomorrow ?-Continue Imdur ?-Continue to monitor ? ?Essential hypertension ?Blood pressure within goal. ?-Continue Imdur ? ?Chronic diastolic CHF (congestive heart failure) ([place]) ?Clinically appears euvolemic. ?-Continue to monitor ? ?Paroxysmal atrial fibrillation (H[facility]) ?Currently in sinus rhythm. ?-Continue home amiodarone ?-We will resume Eliquis from tomorrow-it was held due to acute CVA. ? ?CLL (chronic lymphocytic leukemia) ([place]) ?- Being managed by outpatient oncology ? ?PTSD (post-traumatic stress disorder) ?- Continue home dose  of Remeron and trazodone ? ?Subjective: Patient was complaining about substernal, pressure-like, nonradiating chest pain.  Some associated shortness of breath.  Pain increased with deep breathing and body movements. ?No nausea or vomiting. ? ?Physical Exam: ?Vitals:  ? 10/21/21 0922 10/21/21  0946 10/21/21 0957 10/21/21 1145  ?BP: 121/81 114/66 110/61 108/70  ?Pulse: 82 89 89 83  ?Resp: 20   16  ?Temp: 99.6 ?F (37.6 ?C)   98 ?F (36.7 ?C)  ?TempSrc:    Oral  ?SpO2: 96%   97%  ?Weight:      ?Height:      ? ?General.  Well-developed elderly man, in no acute distress. ?Pulmonary.  Lungs clear bilaterally, normal respiratory effort. ?CV.  Regular rate and rhythm, no JVD, rub or murmur. ?Abdomen.  Soft, nontender, nondistended, BS positive. ?CNS.  Alert and oriented .  No focal neurologic deficit. ?Extremities.  No edema, no cyanosis, pulses intact and symmetrical. ?Psychiatry.  Judgment and insight appears normal. ? ?Data Reviewed: ?Prior notes and labs reviewed. ? ?Family Communication: Discussed with wife on phone. ? ?Disposition: ?Status is: Inpatient ? ? Planned Discharge Destination: Rehab ? ? ?Time spent: 50 minutes ? ?This record has been created using Systems analyst. Errors have been sought and corrected,but may not always be located. Such creation errors do not reflect on the standard of care. ? ?Author: ?Lorella Nimrod, MD ?10/21/2021 12:46 PM ? ?For on call review [email or web address].  ?

## 2021-10-21 NOTE — Assessment & Plan Note (Signed)
-   Continue home dose of Remeron and trazodone ?

## 2021-10-21 NOTE — Telephone Encounter (Signed)
To Drs. Gollan/ Caryl Comes as an Pharmacist, hospital.  ? ? ?

## 2021-10-21 NOTE — Assessment & Plan Note (Signed)
Currently in sinus rhythm. ?-Continue home amiodarone ?-We will resume Eliquis from tomorrow-it was held due to acute CVA. ?

## 2021-10-21 NOTE — Assessment & Plan Note (Addendum)
Patient did had some chest pain this morning but it seems atypical with pleuritic and musculoskeletal component. ?-Continue aspirin and statin. ?-Discontinue aspirin after starting Eliquis tomorrow ?-Continue Imdur ?-Continue to monitor ?

## 2021-10-21 NOTE — Assessment & Plan Note (Signed)
Clinically appears euvolemic. ?-Continue to monitor ?

## 2021-10-21 NOTE — Hospital Course (Addendum)
Taken from prior notes. ? ?[name] is a 76 y.o. male with medical history significant for HTN, HFpEF, recent severe debilitation following COVID infection in January, atrial fibrillation on Eliquis, s/p PPM and CLL was in his USOH until yesterday when his wife noted that he was unable to get out of bed as per usual.  He had clear left-sided weakness and was having slurred speech.  Patient refused to come to the ED because he was tired of being in the hospital.  This morning, patient's speech had cleared however given continued left-sided weakness he agreed to come to the ED. ?  ?In the ED patient was noted to have marked weakness on his left with NIH SS score of 4.  Head CT was negative for any bleed however patient was unable to have MRI or MRA due to pacemaker. ?  ?Patient states he is very tired and sleepy.  Patient's wife notes that he has had significant lethargy since his COVID infection.  [place] had [place] but now barely likes to get out of bed.  He sleeps most of the time apparently.  She denies that there is been any new medications.  Notes his lethargy seems to be worsening over time.  Patient and wife both deny any fevers or chills, no nausea or vomiting, no diarrhea, he apparently has a very good appetite.  No dysuria ?  ?He has been evaluated by Neuro wth dx of CVA.  They did not feel transfer to [facility] for MRI in setting of pacer would change tx.   ? ?PT/OT is recommending CIR-pending insurance authorization. ? ?4/3: Patient developed substernal chest pain this morning, EKG without any acute changes and troponin x2 remains negative.  Pain feels like pressure but increased with deep breathing and movement.  Most likely atypical with some pleuritic and musculoskeletal component. ? ?4/4: Overnight some concern of increase lethargy, no change in neurologic exam. ?Still waiting for insurance authorization before moving to CIR. ? ?4/5: Patient remained stable with some  improvement in neurologic deficiencies.  Insurance authorization came back and patient is being discharged for CIR. ?Saw the note from his oncologist that he should not restart Venclexta at this time due to recent stroke.  Patient need to follow-up closely with his oncologist for further recommendations. ?Patient also has intolerance to statin and takes cholesterol Shots, unable to find the name, by PCP every 2 weeks which he should continue. ? ? ? ? ?

## 2021-10-21 NOTE — Telephone Encounter (Signed)
09/03/21 appt with Billey Chang, NP for virtual [facility] visit after testing positive for COVID:  Discussed with Dr. Rogue Bussing regarding case and plan.  Patient was also advised to hold the venetoclax for at least a week.  He can resume it if he is feeling better after a week or we can see him in the clinic for further  ? ?

## 2021-10-21 NOTE — Progress Notes (Signed)
Physical Therapy Treatment ?Patient Details ?Name: [name] ?MRN: [id number] ?DOB: 03-24-1946 ?Today's Date: 10/21/2021 ? ? ?History of Present Illness Pt is a 76 year old male admitted with with left-sided weakness and also slurred speech. PMH significant for history of A-fib on Eliquis, CLL, COPD, CAD status post CABG, OSA on CPAP, hypertension, hyperlipidemia, sick sinus syndrome status post pacemaker who presents for evaluation of left-sided weakness.  Patient has had chronic shortness of breath since January when he had COVID but denies any acute changes. ? ?  ?PT Comments  ? ? Pt awake and alert resting in bed upon PT entrance into room today. Pt denies any c/o pain at rest and is willing to work w/ PT today. Per RN, Pt has had c/o chest pain; EKG was performed and per RN came back normal. He is able to perform bed mobility w/ minA and reliance on bed rails. Once seated EOB he is able to progress to standing w/ modA using RW to step-pivot transfer to recliner; recliner was wheeled out into hallway in order for patient to focus on ambulation w/o significant obstacles present. He is able to ambulate ~26f (2 bouts of 48f, w/ minA using RW; PT led rest break for ~48m69mbetween bouts. After 2nd bout; Pt was able to perform 5x sit <> stand w/ minA; provided due to lack of trunk control. Verbal cues provided for proper LE/UE placement for efficiency and safety with transfers. Pt back in recliner w/ all needs w/in reach prior to PT exiting room. Pt will benefit from continued skilled PT in order to increase LE strength/endurance, improve functional mobility/coordination, and restore PLOF. Current discharge recommendation remains appropriate due to the level of assistance required by the patient to ensure safety and improve overall function.  ?  ?Recommendations for follow up therapy are one component of a multi-disciplinary discharge planning process, led by the attending physician.  Recommendations may be updated  based on patient status, additional functional criteria and insurance authorization. ? ?Follow Up Recommendations ? Acute inpatient rehab (3hours/day) ?  ?  ?Assistance Recommended at Discharge Frequent or constant Supervision/Assistance  ?Patient can return home with the following A little help with walking and/or transfers;A little help with bathing/dressing/bathroom;Assistance with cooking/housework;Assist for transportation;Help with stairs or ramp for entrance ?  ?Equipment Recommendations ? Rolling walker (2 wheels)  ?  ?Recommendations for Other Services   ? ? ?  ?Precautions / Restrictions Precautions ?Precautions: Fall ?Restrictions ?Weight Bearing Restrictions: No  ?  ? ?Mobility ? Bed Mobility ?Overal bed mobility: Needs Assistance ?Bed Mobility: Supine to Sit ?  ?  ?Supine to sit: HOB elevated, Min assist ?  ?  ?General bed mobility comments: minA due to reliance on bed rails ?  ? ?Transfers ?Overall transfer level: Needs assistance ?Equipment used: Rolling walker (2 wheels) ?Transfers: Sit to/from Stand ?Sit to Stand: Mod assist ?  ?  ?  ?  ?  ?General transfer comment: modA; verbal cues for proper LE placement and forward weight shift w/ trunk ?  ? ?Ambulation/Gait ?Ambulation/Gait assistance: Min assist, Min guard ?Gait Distance (Feet): 80 Feet (2 bouts of 63f4f?Assistive device: Rolling walker (2 wheels) ?Gait Pattern/deviations: Decreased step length - right, Decreased step length - left, Decreased stride length, Step-through pattern ?Gait velocity: decreased ?  ?  ?  ? ? ?Stairs ?  ?  ?  ?  ?  ? ? ?Wheelchair Mobility ?  ? ?Modified Rankin (Stroke Patients Only) ?  ? ? ?  ?  Balance Overall balance assessment: Needs assistance ?Sitting-balance support: Feet supported ?Sitting balance-Leahy Scale: Fair ?  ?  ?Standing balance support: Bilateral upper extremity supported, Reliant on assistive device for balance, During functional activity ?Standing balance-Leahy Scale: Fair ?  ?  ?  ?  ?  ?  ?  ?  ?   ?  ?  ?  ?  ? ?  ?Cognition Arousal/Alertness: Awake/alert ?Behavior During Therapy: [facility] for tasks assessed/performed ?Overall Cognitive Status: Impaired/Different from baseline ?  ?  ?  ?  ?  ?  ?  ?  ?  ?  ?  ?  ?  ?  ?  ?  ?  ?  ?  ? ?  ?Exercises Other Exercises ?Other Exercises: 5x sit <> stand following ambulation ? ?  ?General Comments   ?  ?  ? ?Pertinent Vitals/Pain Pain Assessment ?Pain Assessment: Faces ?Faces Pain Scale: Hurts a little bit ?Pain Location: L side ?Pain Descriptors / Indicators: Dull ?Pain Intervention(s): Monitored during session  ? ? ?Home Living   ?  ?  ?  ?  ?  ?  ?  ?  ?  ?   ?  ?Prior Function    ?  ?  ?   ? ?PT Goals (current goals can now be found in the care plan section) Progress towards PT goals: Progressing toward goals ? ?  ?Frequency ? ? ? 7X/week ? ? ? ?  ?PT Plan Current plan remains appropriate  ? ? ?Co-evaluation   ?  ?  ?  ?  ? ?  ?AM-PAC PT "6 Clicks" Mobility   ?Outcome Measure ? Help needed turning from your back to your side while in a flat bed without using bedrails?: A Little ?Help needed moving from lying on your back to sitting on the side of a flat bed without using bedrails?: A Little ?Help needed moving to and from a bed to a chair (including a wheelchair)?: A Little ?Help needed standing up from a chair using your arms (e.g., wheelchair or bedside chair)?: A Little ?Help needed to walk in hospital room?: A Little ?Help needed climbing 3-5 steps with a railing? : Total ?6 Click Score: 16 ? ?  ?End of Session Equipment Utilized During Treatment: Gait belt ?Activity Tolerance: Patient tolerated treatment well;Patient limited by fatigue ?Patient left: in chair;with chair alarm set;with call bell/phone within reach ?Nurse Communication: Mobility status ?PT Visit Diagnosis: Unsteadiness on feet (R26.81);History of falling (Z91.81);Muscle weakness (generalized) (M62.81) ?  ? ? ?Time: 5625-6389 ?PT Time Calculation (min) (ACUTE ONLY): 37 min ? ?Charges:              ?          ? ?Jonnie Kind, SPT ?10/21/2021, 12:26 PM ? ?

## 2021-10-21 NOTE — Progress Notes (Signed)
Occupational Therapy Treatment ?Patient Details ?Name: [name] ?MRN: [id number] ?DOB: [birth year] ?Today's Date: 10/21/2021 ? ? ?History of present illness Pt is a 76 year old male admitted with with left-sided weakness and also slurred speech. PMH significant for history of A-fib on Eliquis, CLL, COPD, CAD status post CABG, OSA on CPAP, hypertension, hyperlipidemia, sick sinus syndrome status post pacemaker who presents for evaluation of left-sided weakness.  Patient has had chronic shortness of breath since January when he had COVID but denies any acute changes. ?  ?OT comments ? Pt seen for OT treatment on this date. Upon arrival to room, pt awake and seated upright in recliner on 2L of supplemental O2. Pt reported that he was having chest pain this AM but denied any chest pain during OT session. Pt agreeable to OT tx. At start of session, pt return-demo'd HEP provided during OT session yesterday and reported that he was performing independently. Pt continues to present with decreased LUE strength/coordination and decreased balance. Due to these functional impairments, pt requires MIN A for sit>stand transfers and MIN GUARD for standing grooming tasks. This date, pt issued with GREEN theraputty and instructed on strengthening and coordination exercises for L hand, including gross grasping, 3 point pinching, and digit abd/add. Pt was able to return demo with intermittent vc for technique to improve quality of movement.  Encouraged completion 5-10 min, 3x per day. Pt is making good progress toward goals. Pt continues to benefit from skilled OT services to maximize return to PLOF and minimize risk of future falls, injury, caregiver burden, and readmission. Will continue to follow POC. Discharge recommendation remains appropriate.    ? ?Recommendations for follow up therapy are one component of a multi-disciplinary discharge planning process, led by the attending physician.  Recommendations may be updated based on  patient status, additional functional criteria and insurance authorization. ?   ?Follow Up Recommendations ? Acute inpatient rehab (3hours/day)  ?  ?Assistance Recommended at Discharge Frequent or constant Supervision/Assistance  ?Patient can return home with the following ? A lot of help with walking and/or transfers;A lot of help with bathing/dressing/bathroom;Assist for transportation;Assistance with cooking/housework;Direct supervision/assist for financial management;Help with stairs or ramp for entrance;Direct supervision/assist for medications management ?  ?Equipment Recommendations ? Other (comment) (pt has recommended equipment, defer to next venue of care)  ?  ?   ?Precautions / Restrictions Precautions ?Precautions: Fall ?Restrictions ?Weight Bearing Restrictions: No  ? ? ?  ? ?Mobility Bed Mobility ?  ?  ?  ?  ?  ?  ?  ?General bed mobility comments: not assessed, pt in recliner pre/post session ?  ? ?Transfers ?Overall transfer level: Needs assistance ?Equipment used: Rolling walker (2 wheels) ?Transfers: Sit to/from Stand ?Sit to Stand: Min assist ?  ?  ?  ?  ?  ?General transfer comment: Requires only MIN A for upward momentum from recliner ?  ?  ?Balance Overall balance assessment: Needs assistance ?Sitting-balance support: No upper extremity supported, Feet supported ?Sitting balance-Leahy Scale: Fair ?Sitting balance - Comments: fair dynamic sitting balance during seated ADLs ?  ?Standing balance support: Single extremity supported, During functional activity ?Standing balance-Leahy Scale: Fair ?Standing balance comment: Requires MIN GUARD during standing grooming tasks ?  ?  ?  ?  ?  ?  ?  ?  ?  ?  ?  ?   ? ?ADL either performed or assessed with clinical judgement  ? ?ADL Overall ADL's : Needs assistance/impaired ?  ?  ?Grooming:  Applying deodorant;Min guard;Standing ?Grooming Details (indicate cue type and reason): Requires MIN GUARD when reaching for grooming items outside of reach d/t decreased  shoulder flexion in LUE ?  ?  ?  ?  ?  ?  ?  ?  ?  ?  ?  ?  ?  ?  ?  ?  ?  ? ? ? ?Cognition Arousal/Alertness: Awake/alert ?Behavior During Therapy: [facility] for tasks assessed/performed ?Overall Cognitive Status: Impaired/Different from baseline ?  ?  ?  ?  ?  ?  ?  ?  ?  ?  ?  ?  ?  ?  ?  ?  ?General Comments: A&Ox4. Requires increased processing time. Able to recall educaiton provided yesterday ?  ?  ?   ?Exercises Other Exercises ?Other Exercises: Pt issued with GREEN theraputty and instructed on strengthening and coordination exercises for L hand, including gross grasping, 3 point pinching, and digit abd/add.Pt was able to return demo with intermittent vc for technique to improve quality of movement.  Encouraged completion 5-10 min, 3x per day. ? ?  ?   ?General Comments HR 80s-90s, SpO2>94% (while on 2L of O2) throughout session  ? ? ?Pertinent Vitals/ Pain       Pain Assessment ?Pain Assessment: No/denies pain ? ?   ?   ? ?Frequency ? Min 3X/week  ? ? ? ? ?  ?Progress Toward Goals ? ?OT Goals(current goals can now be found in the care plan section) ? Progress towards OT goals: Progressing toward goals ? ?Acute Rehab OT Goals ?Patient Stated Goal: to get back to making wood carvings ?OT Goal Formulation: With patient ?Time For Goal Achievement: 11/02/21 ?Potential to Achieve Goals: Good  ?Plan Discharge plan remains appropriate;Frequency remains appropriate   ? ?   ?AM-PAC OT "6 Clicks" Daily Activity     ?Outcome Measure ? ? Help from another person eating meals?: None ?Help from another person taking care of personal grooming?: A Little ?Help from another person toileting, which includes using toliet, bedpan, or urinal?: A Lot ?Help from another person bathing (including washing, rinsing, drying)?: A Lot ?Help from another person to put on and taking off regular upper body clothing?: A Lot ?Help from another person to put on and taking off regular lower body clothing?: A Little ?6 Click Score: 16 ? ?  ?End of  Session Equipment Utilized During Treatment: Rolling walker (2 wheels) ? 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  ?Activity Tolerance Patient tolerated treatment well ?  ?Patient Left in chair;with call bell/phone within reach;with chair alarm set ?  ?Nurse Communication Mobility status ?  ? ?   ? ?Time: 5809-9833 ?OT Time Calculation (min): 29 min ? ?Charges: OT General Charges ?$OT Visit: 1 Visit ?OT Treatments ?$Self Care/Home Management : 8-22 mins ?$Therapeutic Activity: 8-22 mins ? ?Fredirick Maudlin, OTR/L ?[place] ? ?

## 2021-10-21 NOTE — Assessment & Plan Note (Signed)
Patient developed substernal chest pain this morning, feels like pressure, nonradiating, some associated shortness of breath.  Pain increased with deep breathing and body movement. ?EKG done which was without any acute abnormality ?Check troponin-remain negative x2 ?Most likely atypical chest pain as it also has pleuritic and musculoskeletal component. ?-Continue to monitor ?

## 2021-10-21 NOTE — Telephone Encounter (Signed)
Patient's spouse is calling to report to Dr. Rockey Situ the patient is in the hospital and has had a stroke and they can't figure out why.  ?

## 2021-10-21 NOTE — TOC Progression Note (Addendum)
Transition of Care (TOC) - Progression Note  ? ? ?Patient Details  ?Name: [name] ?MRN: [id number] ?Date of Birth: [birth year] ? ?Transition of Care (TOC) CM/SW Contact  ?Jayde Daffin A Samul Mcinroy, LCSW ?Phone Number: ?10/21/2021, 12:02 PM ? ?Clinical Narrative:   Notified CIR that this pt is stable for dc per MD ? ?Per CIR they have started insurance auth. ? ? ?Expected Discharge Plan: [place] ?Barriers to Discharge: Continued Medical Work up ? ?Expected Discharge Plan and Services ?Expected Discharge Plan: [place] ?  ?  ?  ?Living arrangements for the past 2 months: [place] ?                ?  ?  ?  ?  ?  ?  ?  ?  ?  ?  ? ? ?Social Determinants of Health (SDOH) Interventions ?  ? ?Readmission Risk Interventions ? ?  10/19/2021  ? 10:18 AM  ?Readmission Risk Prevention Plan  ?Transportation Screening Complete  ?PCP or Specialist Appt within 3-5 Days Complete  ?[place] or Home Care Consult Complete  ?Social Work Consult for [place] Planning/Counseling Complete  ?Palliative Care Screening Not Applicable  ?Medication Review Press photographer) Complete  ? ? ?

## 2021-10-21 NOTE — Assessment & Plan Note (Signed)
CT head was negative, unable to obtain MRI due to pacemaker placement.  Neurology is treating like a stroke, does not think that transfer to [facility] for MRI is necessary.Marland Kitchen ?PT/OT is recommending CIR-pending insurance authorization. ?CTA head and neck was without any acute abnormality or large vessel occlusion. ?A1c of 5.9 ?Lipid profile with LDL of 39 ?Will resume home dose of Eliquis from tomorrow ?We will discontinue aspirin after resuming Eliquis ?

## 2021-10-21 NOTE — Progress Notes (Signed)
Inpatient Rehab Admissions Coordinator:  Began insurance authorization. Awaiting decision and bed availability. Will continue to follow.  Elwyn Klosinski Graves Madden, MS, CCC-SLP Admissions Coordinator [phone]  

## 2021-10-21 NOTE — Progress Notes (Signed)
? ?      CROSS COVER NOTE ? ?NAME: [name] ?MRN: [id number] ?DOB : June 13, 1946 ? ?Called to bedside for report of lethargy and (L) sided drift. ? ?Patient responsive to voice and oriented x4. He is able to follow commands and answer questions appropriately. Still has slurred speech. On assessment patient does have a depressed mood and reports thoughts and memories that torment him related to his [employer]. Endorses intermittently having nightmares related to the same. Consider routine psych consult tomorrow. ? ?(L) arm and leg drift is unchanged from drift described in EDP's note on presentation.  ? ?Neomia Glass MHA, MSN, FNP-BC ?Nurse Practitioner ?[facility] ?[place] ?Pager [phone] ? ?

## 2021-10-22 DIAGNOSIS — I10 Essential (primary) hypertension: Secondary | ICD-10-CM | POA: Diagnosis not present

## 2021-10-22 DIAGNOSIS — I25118 Atherosclerotic heart disease of native coronary artery with other forms of angina pectoris: Secondary | ICD-10-CM | POA: Diagnosis not present

## 2021-10-22 DIAGNOSIS — I6389 Other cerebral infarction: Secondary | ICD-10-CM | POA: Diagnosis not present

## 2021-10-22 DIAGNOSIS — R079 Chest pain, unspecified: Secondary | ICD-10-CM | POA: Diagnosis not present

## 2021-10-22 MED ORDER — APIXABAN 5 MG PO TABS
5.0000 mg | ORAL_TABLET | Freq: Two times a day (BID) | ORAL | Status: DC
  Administered 2021-10-22 – 2021-10-23 (×2): 5 mg via ORAL
  Filled 2021-10-22 (×2): qty 1

## 2021-10-22 MED ORDER — BOOST / RESOURCE BREEZE PO LIQD CUSTOM
1.0000 | Freq: Three times a day (TID) | ORAL | Status: DC
  Administered 2021-10-22 – 2021-10-23 (×4): 1 via ORAL

## 2021-10-22 NOTE — Progress Notes (Signed)
?Progress Note ? ? ?Patient: [name] JQB:[id number] DOB: [birth year] DOA: 10/18/2021     4 ?DOS: the patient was seen and examined on 10/22/2021 ?  ?Brief hospital course: ?Taken from prior notes. ? ?[name] is a 76 y.o. male with medical history significant for HTN, HFpEF, recent severe debilitation following COVID infection in January, atrial fibrillation on Eliquis, s/p PPM and CLL was in his USOH until yesterday when his wife noted that he was unable to get out of bed as per usual.  He had clear left-sided weakness and was having slurred speech.  Patient refused to come to the ED because he was tired of being in the hospital.  This morning, patient's speech had cleared however given continued left-sided weakness he agreed to come to the ED. ?  ?In the ED patient was noted to have marked weakness on his left with NIH SS score of 4.  Head CT was negative for any bleed however patient was unable to have MRI or MRA due to pacemaker. ?  ?Patient states he is very tired and sleepy.  Patient's wife notes that he has had significant lethargy since his COVID infection.  [place] but now barely likes to get out of bed.  He sleeps most of the time apparently.  She denies that there is been any new medications.  Notes his lethargy seems to be worsening over time.  Patient and wife both deny any fevers or chills, no nausea or vomiting, no diarrhea, he apparently has a very good appetite.  No dysuria ?  ?He has been evaluated by Neuro wth dx of CVA.  They did not feel transfer to [facility] for MRI in setting of pacer would change tx.   ? ?PT/OT is recommending CIR-pending insurance authorization. ? ?4/3: Patient developed substernal chest pain this morning, EKG without any acute changes and troponin x2 remains negative.  Pain feels like pressure but increased with deep breathing and movement.  Most likely atypical with some pleuritic and musculoskeletal component. ? ?4/4: Overnight some  concern of increase lethargy, no change in neurologic exam. ?Still waiting for insurance authorization before moving to CIR. ? ? ?Assessment and Plan: ?* CVA (cerebral vascular accident) ([place]) ?CT head was negative, unable to obtain MRI due to pacemaker placement.  Neurology is treating like a stroke, does not think that transfer to [facility] for MRI is necessary.Marland Kitchen ?PT/OT is recommending CIR-pending insurance authorization. ?CTA head and neck was without any acute abnormality or large vessel occlusion. ?A1c of 5.9 ?Lipid profile with LDL of 39 ?Will resume home dose of Eliquis from tomorrow ?We will discontinue aspirin after resuming Eliquis ? ?Chest pain ?Patient developed substernal chest pain this morning, feels like pressure, nonradiating, some associated shortness of breath.  Pain increased with deep breathing and body movement. ?EKG done which was without any acute abnormality ?Check troponin-remain negative x2 ?Most likely atypical chest pain as it also has pleuritic and musculoskeletal component. ?-Continue to monitor ? ?CAD (coronary artery disease) ?Patient did had some chest pain this morning but it seems atypical with pleuritic and musculoskeletal component. ?-Continue aspirin and statin. ?-Discontinue aspirin after starting Eliquis tomorrow ?-Continue Imdur ?-Continue to monitor ? ?Essential hypertension ?Blood pressure within goal. ?-Continue Imdur ? ?Chronic diastolic CHF (congestive heart failure) ([place]) ?Clinically appears euvolemic. ?-Continue to monitor ? ?Paroxysmal atrial fibrillation (HCC) ?Currently in sinus rhythm. ?-Continue home amiodarone ?-We will resume Eliquis from tomorrow-it was held due to acute  CVA. ? ?CLL (chronic lymphocytic leukemia) ([place]) ?- Being managed by outpatient oncology ?-Currently chronic medications are being held since he had COVID. ?-Daughter to discuss with his oncologist when to resume ? ?PTSD (post-traumatic stress disorder) ?- Continue home dose of Remeron and  trazodone ? ? ?Subjective: Patient was sitting comfortably in chair when seen today.  No new complaints.  Daughter at bedside. ? ?Physical Exam: ?Vitals:  ? 10/22/21 0414 10/22/21 0839 10/22/21 1107 10/22/21 1547  ?BP: 115/69 119/67 109/61 108/67  ?Pulse: 72 79 81 86  ?Resp: '18 16 16 16  '$ ?Temp: 97.8 ?F (36.6 ?C) 97.7 ?F (36.5 ?C) 97.7 ?F (36.5 ?C) 98.3 ?F (36.8 ?C)  ?TempSrc: Oral Oral Oral Oral  ?SpO2: 99% 96% 97% 98%  ?Weight:      ?Height:      ? ?General.     In no acute distress. ?Pulmonary.  Lungs clear bilaterally, normal respiratory effort. ?CV.  Regular rate and rhythm, no JVD, rub or murmur. ?Abdomen.  Soft, nontender, nondistended, BS positive. ?CNS.  Alert and oriented .  L UE 4/5 and L LE 3+/5, 5/5 on right upper and lower extremities. ?Extremities.  No edema, no cyanosis, pulses intact and symmetrical. ?Psychiatry.  Judgment and insight appears normal. ? ?Data Reviewed: ?Prior notes and labs reviewed. ? ?Family Communication: Discussed with daughter at bedside. ? ?Disposition: ?Status is: Inpatient ?Remains inpatient appropriate because: Waiting for insurance authorization for CIR ? ? Planned Discharge Destination: Rehab ? ?DVT prophylaxis.  Eliquis ? ?Time spent: 40 minutes ? ?This record has been created using Systems analyst. Errors have been sought and corrected,but may not always be located. Such creation errors do not reflect on the standard of care. ? ?Author: ?Lorella Nimrod, MD ?10/22/2021 3:56 PM ? ?For on call review [email or web address].  ?

## 2021-10-22 NOTE — TOC Progression Note (Signed)
Transition of Care (TOC) - Progression Note  ? ? ?Patient Details  ?Name: [name] ?MRN: [id number] ?Date of Birth: 1946-06-26 ? ?Transition of Care (TOC) CM/SW Contact  ?Pete Pelt, RN ?Phone Number: ?10/22/2021, 2:59 PM ? ?Clinical Narrative:   Josem Kaufmann still in progress for CIR as per Pamala Hurry.  Will await final auth ? ? ? ?Expected Discharge Plan: [place] ?Barriers to Discharge: Continued Medical Work up ? ?Expected Discharge Plan and Services ?Expected Discharge Plan: [place] ?  ?  ?  ?Living arrangements for the past 2 months: [place] ?                ?  ?  ?  ?  ?  ?  ?  ?  ?  ?  ? ? ?Social Determinants of Health (SDOH) Interventions ?  ? ?Readmission Risk Interventions ? ?  10/19/2021  ? 10:18 AM  ?Readmission Risk Prevention Plan  ?Transportation Screening Complete  ?PCP or Specialist Appt within 3-5 Days Complete  ?[place] or Home Care Consult Complete  ?Social Work Consult for [place] Planning/Counseling Complete  ?Palliative Care Screening Not Applicable  ?Medication Review Press photographer) Complete  ? ? ?

## 2021-10-22 NOTE — Progress Notes (Signed)
Inpatient Rehabilitation Admissions Coordinator  ? ?Insurance Auth pending for possible Cir admit. ? ?Danne Baxter, RN, MSN ?Rehab Admissions Coordinator ?[phone] ?10/22/2021 2:58 PM ? ?

## 2021-10-22 NOTE — Progress Notes (Signed)
Physical Therapy Treatment ?Patient Details ?Name: [name] ?MRN: [id number] ?DOB: Oct 01, 1945 ?Today's Date: 10/22/2021 ? ? ?History of Present Illness Pt is a 76 year old male admitted with with left-sided weakness and also slurred speech. PMH significant for history of A-fib on Eliquis, CLL, COPD, CAD status post CABG, OSA on CPAP, hypertension, hyperlipidemia, sick sinus syndrome status post pacemaker who presents for evaluation of left-sided weakness.  Patient has had chronic shortness of breath since January when he had COVID but denies any acute changes. ? ?  ?PT Comments  ? ? Physical Therapy Treatment completed this date. Patient tolerated session well and was agreeable to treatment. Patient started and ended session in recliner. Patient was able to demonstrate increased independence with Sit to stand transfer from recliner at [facility]. X5 additional reps were completed for LLE strengthening. Attempted 1 L staggered sit to stand, however patient was unable to complete at this time due to LLE weakness. Patient was able to ambulate ~77fet with RW and CGA with no noted L knee buckling this session. Patient continues to fatigue quickly, however is progressing towards his goals and would continue to benefit from skilled physical therapy in order to optimize patient's return to PLOF. Continue to recommend acute inpatient rehab upon discharge from acute hospitalization.  ?  ?Recommendations for follow up therapy are one component of a multi-disciplinary discharge planning process, led by the attending physician.  Recommendations may be updated based on patient status, additional functional criteria and insurance authorization. ? ?Follow Up Recommendations ? Acute inpatient rehab (3hours/day) ?  ?  ?Assistance Recommended at Discharge Frequent or constant Supervision/Assistance  ?Patient can return home with the following A little help with walking and/or transfers;A little help with  bathing/dressing/bathroom;Assistance with cooking/housework;Assist for transportation;Help with stairs or ramp for entrance ?  ?Equipment Recommendations ? Rolling walker (2 wheels)  ?  ?Recommendations for Other Services   ? ? ?  ?Precautions / Restrictions Precautions ?Precautions: Fall ?Restrictions ?Weight Bearing Restrictions: No  ?  ? ?Mobility ? Bed Mobility ?Overal bed mobility:  (not assessed as patient started and ended session in recliner) ?  ?  ?  ?  ?  ?  ?  ?  ? ?Transfers ?Overall transfer level: Needs assistance ?Equipment used: Rolling walker (2 wheels) ?Transfers: Sit to/from Stand ?Sit to Stand: Min guard ?  ?  ?  ?  ?  ?  ?  ? ?Ambulation/Gait ?Ambulation/Gait assistance: Min guard, Min assist ?Gait Distance (Feet): 84 Feet ?Assistive device: Rolling walker (2 wheels) ?Gait Pattern/deviations: Decreased step length - right, Decreased step length - left, Decreased stride length, Step-through pattern, Antalgic ?Gait velocity: decreased ?  ?  ?General Gait Details: No LE buckling noted during ambualtion bout, however remained CGA for safety ? ? ?Stairs ?  ?  ?  ?  ?  ? ? ?Wheelchair Mobility ?  ? ?Modified Rankin (Stroke Patients Only) ?  ? ? ?  ?Balance Overall balance assessment: Needs assistance ?Sitting-balance support: No upper extremity supported, Feet supported ?Sitting balance-Leahy Scale: Fair ?  ?  ?Standing balance support: Single extremity supported, During functional activity ?Standing balance-Leahy Scale: Fair ?  ?  ?  ?  ?  ?  ?  ?  ?  ?  ?  ?  ?  ? ?  ?Cognition Arousal/Alertness: Awake/alert ?Behavior During Therapy: [facility]/performed ?Overall Cognitive Status: Impaired/Different from baseline ?Area of Impairment: Orientation, Following commands, Safety/judgement, Awareness, Problem solving ?  ?  ?  ?  ?  ?  ?  ?  ?  Orientation Level: Person, Place, Time, Situation ?  ?  ?Following Commands: Follows one step commands with increased time, Follows multi-step commands  inconsistently ?Safety/Judgement: Decreased awareness of deficits ?Awareness: Emergent ?Problem Solving: Slow processing, Decreased initiation, Requires verbal cues, Requires tactile cues ?General Comments: A&Ox4. Requires increased processing time. ?  ?  ? ?  ?Exercises Other Exercises ?Other Exercises: x5 sit to stands from recliner ?Other Exercises: attempted 1 L staggered sit to stand however patient was unable to complete due to LLE weakness ? ?  ?General Comments General comments (skin integrity, edema, etc.): SpO2 remained >90% throughout session ?  ?  ? ?Pertinent Vitals/Pain Pain Assessment ?Pain Assessment: No/denies pain ?Pain Intervention(s): Monitored during session, Repositioned  ? ? ?Home Living   ?  ?  ?  ?  ?  ?  ?  ?  ?  ?   ?  ?Prior Function    ?  ?  ?   ? ?PT Goals (current goals can now be found in the care plan section) Acute Rehab PT Goals ?Patient Stated Goal: to get stronger and go home ?PT Goal Formulation: With patient/family ?Time For Goal Achievement: 11/02/21 ?Potential to Achieve Goals: Fair ?Progress towards PT goals: Progressing toward goals ? ?  ?Frequency ? ? ? 7X/week ? ? ? ?  ?PT Plan Current plan remains appropriate  ? ? ?Co-evaluation   ?  ?  ?  ?  ? ?  ?AM-PAC PT "6 Clicks" Mobility   ?Outcome Measure ? Help needed turning from your back to your side while in a flat bed without using bedrails?: A Little ?Help needed moving from lying on your back to sitting on the side of a flat bed without using bedrails?: A Little ?Help needed moving to and from a bed to a chair (including a wheelchair)?: A Little ?Help needed standing up from a chair using your arms (e.g., wheelchair or bedside chair)?: A Little ?Help needed to walk in hospital room?: A Little ?Help needed climbing 3-5 steps with a railing? : Total ?6 Click Score: 16 ? ?  ?End of Session Equipment Utilized During Treatment: Gait belt ?Activity Tolerance: Patient tolerated treatment well;Patient limited by fatigue ?Patient  left: in chair;with chair alarm set;with call bell/phone within reach;with nursing/sitter in room ?Nurse Communication: Mobility status ?PT Visit Diagnosis: Unsteadiness on feet (R26.81);History of falling (Z91.81);Muscle weakness (generalized) (M62.81) ?  ? ? ?Time: 1916-6060 ?PT Time Calculation (min) (ACUTE ONLY): 23 min ? ?Charges:  $Gait Training: 23-37 mins          ?          ? ?Iva Boop, PT  ?10/22/21. 1:44 PM ? ? ?

## 2021-10-22 NOTE — Assessment & Plan Note (Signed)
-   Being managed by outpatient oncology ?-Currently chronic medications are being held since he had COVID. ?-Daughter to discuss with his oncologist when to resume ?

## 2021-10-22 NOTE — Care Management Important Message (Signed)
Important Message ? ?Patient Details  ?Name: [name] ?MRN: [id number] ?Date of Birth: [birth year] ? ? ?Medicare Important Message Given:  Yes ? ? ? ? ?[name] ?10/22/2021, 10:56 AM ?

## 2021-10-23 ENCOUNTER — Encounter (HOSPITAL_COMMUNITY): Payer: Self-pay | Admitting: Physical Medicine & Rehabilitation

## 2021-10-23 ENCOUNTER — Other Ambulatory Visit: Payer: Self-pay

## 2021-10-23 ENCOUNTER — Encounter: Payer: Self-pay | Admitting: Internal Medicine

## 2021-10-23 ENCOUNTER — Inpatient Hospital Stay (HOSPITAL_COMMUNITY): Payer: Medicare HMO

## 2021-10-23 ENCOUNTER — Inpatient Hospital Stay (HOSPITAL_COMMUNITY)
Admission: RE | Admit: 2021-10-23 | Discharge: 2021-11-01 | DRG: 057 | Disposition: A | Discharge status: Home Health | Payer: Medicare HMO | Source: Other Acute Inpatient Hospital | Attending: Physical Medicine & Rehabilitation | Admitting: Physical Medicine & Rehabilitation

## 2021-10-23 DIAGNOSIS — E7800 Pure hypercholesterolemia, unspecified: Secondary | ICD-10-CM | POA: Diagnosis present

## 2021-10-23 DIAGNOSIS — I10 Essential (primary) hypertension: Secondary | ICD-10-CM | POA: Diagnosis not present

## 2021-10-23 DIAGNOSIS — I639 Cerebral infarction, unspecified: Principal | ICD-10-CM

## 2021-10-23 DIAGNOSIS — R292 Abnormal reflex: Secondary | ICD-10-CM | POA: Diagnosis present

## 2021-10-23 DIAGNOSIS — Z9181 History of falling: Secondary | ICD-10-CM

## 2021-10-23 DIAGNOSIS — K219 Gastro-esophageal reflux disease without esophagitis: Secondary | ICD-10-CM | POA: Diagnosis present

## 2021-10-23 DIAGNOSIS — C9110 Chronic lymphocytic leukemia of B-cell type not having achieved remission: Secondary | ICD-10-CM | POA: Diagnosis present

## 2021-10-23 DIAGNOSIS — M4712 Other spondylosis with myelopathy, cervical region: Secondary | ICD-10-CM | POA: Diagnosis present

## 2021-10-23 DIAGNOSIS — Z741 Need for assistance with personal care: Secondary | ICD-10-CM | POA: Diagnosis present

## 2021-10-23 DIAGNOSIS — I69398 Other sequelae of cerebral infarction: Secondary | ICD-10-CM | POA: Diagnosis not present

## 2021-10-23 DIAGNOSIS — I69328 Other speech and language deficits following cerebral infarction: Secondary | ICD-10-CM

## 2021-10-23 DIAGNOSIS — I69354 Hemiplegia and hemiparesis following cerebral infarction affecting left non-dominant side: Principal | ICD-10-CM

## 2021-10-23 DIAGNOSIS — R252 Cramp and spasm: Secondary | ICD-10-CM

## 2021-10-23 DIAGNOSIS — I2510 Atherosclerotic heart disease of native coronary artery without angina pectoris: Secondary | ICD-10-CM | POA: Diagnosis present

## 2021-10-23 DIAGNOSIS — Z7901 Long term (current) use of anticoagulants: Secondary | ICD-10-CM | POA: Diagnosis not present

## 2021-10-23 DIAGNOSIS — Z87891 Personal history of nicotine dependence: Secondary | ICD-10-CM | POA: Diagnosis not present

## 2021-10-23 DIAGNOSIS — I252 Old myocardial infarction: Secondary | ICD-10-CM

## 2021-10-23 DIAGNOSIS — Z7951 Long term (current) use of inhaled steroids: Secondary | ICD-10-CM

## 2021-10-23 DIAGNOSIS — U071 COVID-19: Secondary | ICD-10-CM | POA: Diagnosis not present

## 2021-10-23 DIAGNOSIS — M4802 Spinal stenosis, cervical region: Secondary | ICD-10-CM | POA: Diagnosis present

## 2021-10-23 DIAGNOSIS — R5381 Other malaise: Secondary | ICD-10-CM | POA: Diagnosis present

## 2021-10-23 DIAGNOSIS — I440 Atrioventricular block, first degree: Secondary | ICD-10-CM | POA: Diagnosis present

## 2021-10-23 DIAGNOSIS — I480 Paroxysmal atrial fibrillation: Secondary | ICD-10-CM | POA: Diagnosis not present

## 2021-10-23 DIAGNOSIS — R278 Other lack of coordination: Secondary | ICD-10-CM | POA: Diagnosis present

## 2021-10-23 DIAGNOSIS — I4891 Unspecified atrial fibrillation: Secondary | ICD-10-CM | POA: Diagnosis present

## 2021-10-23 DIAGNOSIS — I5032 Chronic diastolic (congestive) heart failure: Secondary | ICD-10-CM | POA: Diagnosis present

## 2021-10-23 DIAGNOSIS — Z8249 Family history of ischemic heart disease and other diseases of the circulatory system: Secondary | ICD-10-CM

## 2021-10-23 DIAGNOSIS — J449 Chronic obstructive pulmonary disease, unspecified: Secondary | ICD-10-CM | POA: Diagnosis present

## 2021-10-23 DIAGNOSIS — R2681 Unsteadiness on feet: Secondary | ICD-10-CM | POA: Diagnosis present

## 2021-10-23 DIAGNOSIS — G4733 Obstructive sleep apnea (adult) (pediatric): Secondary | ICD-10-CM | POA: Diagnosis present

## 2021-10-23 DIAGNOSIS — F4310 Post-traumatic stress disorder, unspecified: Secondary | ICD-10-CM | POA: Diagnosis present

## 2021-10-23 DIAGNOSIS — U099 Post covid-19 condition, unspecified: Secondary | ICD-10-CM | POA: Diagnosis present

## 2021-10-23 DIAGNOSIS — Z9049 Acquired absence of other specified parts of digestive tract: Secondary | ICD-10-CM

## 2021-10-23 DIAGNOSIS — R258 Other abnormal involuntary movements: Secondary | ICD-10-CM | POA: Diagnosis present

## 2021-10-23 DIAGNOSIS — Z96643 Presence of artificial hip joint, bilateral: Secondary | ICD-10-CM | POA: Diagnosis present

## 2021-10-23 DIAGNOSIS — Z9221 Personal history of antineoplastic chemotherapy: Secondary | ICD-10-CM

## 2021-10-23 DIAGNOSIS — Z950 Presence of cardiac pacemaker: Secondary | ICD-10-CM | POA: Diagnosis not present

## 2021-10-23 DIAGNOSIS — I6389 Other cerebral infarction: Secondary | ICD-10-CM | POA: Diagnosis not present

## 2021-10-23 DIAGNOSIS — J310 Chronic rhinitis: Secondary | ICD-10-CM

## 2021-10-23 DIAGNOSIS — Z974 Presence of external hearing-aid: Secondary | ICD-10-CM

## 2021-10-23 DIAGNOSIS — I739 Peripheral vascular disease, unspecified: Secondary | ICD-10-CM | POA: Diagnosis present

## 2021-10-23 DIAGNOSIS — G8114 Spastic hemiplegia affecting left nondominant side: Secondary | ICD-10-CM | POA: Diagnosis not present

## 2021-10-23 DIAGNOSIS — Z79899 Other long term (current) drug therapy: Secondary | ICD-10-CM

## 2021-10-23 DIAGNOSIS — J440 Chronic obstructive pulmonary disease with acute lower respiratory infection: Secondary | ICD-10-CM | POA: Diagnosis not present

## 2021-10-23 DIAGNOSIS — I495 Sick sinus syndrome: Secondary | ICD-10-CM | POA: Diagnosis present

## 2021-10-23 DIAGNOSIS — Z951 Presence of aortocoronary bypass graft: Secondary | ICD-10-CM

## 2021-10-23 DIAGNOSIS — I110 Hypertensive heart disease with heart failure: Secondary | ICD-10-CM | POA: Diagnosis not present

## 2021-10-23 DIAGNOSIS — R079 Chest pain, unspecified: Secondary | ICD-10-CM | POA: Diagnosis not present

## 2021-10-23 DIAGNOSIS — I25118 Atherosclerotic heart disease of native coronary artery with other forms of angina pectoris: Secondary | ICD-10-CM | POA: Diagnosis not present

## 2021-10-23 DIAGNOSIS — J1282 Pneumonia due to coronavirus disease 2019: Secondary | ICD-10-CM | POA: Diagnosis not present

## 2021-10-23 DIAGNOSIS — H9193 Unspecified hearing loss, bilateral: Secondary | ICD-10-CM | POA: Diagnosis present

## 2021-10-23 DIAGNOSIS — R2689 Other abnormalities of gait and mobility: Secondary | ICD-10-CM | POA: Diagnosis present

## 2021-10-23 MED ORDER — FUROSEMIDE 40 MG PO TABS
40.0000 mg | ORAL_TABLET | Freq: Every day | ORAL | Status: DC
  Administered 2021-10-24 – 2021-11-01 (×9): 40 mg via ORAL
  Filled 2021-10-23 (×9): qty 1

## 2021-10-23 MED ORDER — ALBUTEROL SULFATE HFA 108 (90 BASE) MCG/ACT IN AERS
2.0000 | INHALATION_SPRAY | Freq: Every evening | RESPIRATORY_TRACT | Status: DC
  Administered 2021-10-23 – 2021-10-31 (×9): 2 via RESPIRATORY_TRACT
  Filled 2021-10-23: qty 6.7

## 2021-10-23 MED ORDER — BOOST / RESOURCE BREEZE PO LIQD CUSTOM
1.0000 | Freq: Three times a day (TID) | ORAL | Status: DC
Start: 2021-10-23 — End: 2021-11-01
  Administered 2021-10-24 – 2021-11-01 (×15): 1 via ORAL

## 2021-10-23 MED ORDER — PROCHLORPERAZINE 25 MG RE SUPP: 12.5000 mg | Freq: Four times a day (QID) | RECTAL | Status: DC | PRN

## 2021-10-23 MED ORDER — NITROGLYCERIN 0.4 MG SL SUBL: 0.4000 mg | SUBLINGUAL_TABLET | SUBLINGUAL | Status: DC | PRN

## 2021-10-23 MED ORDER — SORBITOL 70 % SOLN: 30.0000 mL | Freq: Every day | Status: DC | PRN

## 2021-10-23 MED ORDER — MOMETASONE FURO-FORMOTEROL FUM 200-5 MCG/ACT IN AERO
2.0000 | INHALATION_SPRAY | Freq: Two times a day (BID) | RESPIRATORY_TRACT | Status: DC
  Administered 2021-10-23 – 2021-11-01 (×17): 2 via RESPIRATORY_TRACT
  Filled 2021-10-23: qty 8.8

## 2021-10-23 MED ORDER — TRAMADOL HCL 50 MG PO TABS: 50.0000 mg | ORAL_TABLET | Freq: Three times a day (TID) | ORAL | Status: DC | PRN

## 2021-10-23 MED ORDER — MIRTAZAPINE 15 MG PO TABS
15.0000 mg | ORAL_TABLET | Freq: Every evening | ORAL | Status: DC | PRN
Start: 2021-10-23 — End: 2021-10-23

## 2021-10-23 MED ORDER — ISOSORBIDE MONONITRATE ER 30 MG PO TB24
30.0000 mg | ORAL_TABLET | Freq: Every day | ORAL | Status: DC
  Administered 2021-10-24 – 2021-11-01 (×9): 30 mg via ORAL
  Filled 2021-10-23 (×9): qty 1

## 2021-10-23 MED ORDER — PROCHLORPERAZINE EDISYLATE 10 MG/2ML IJ SOLN: 5.0000 mg | Freq: Four times a day (QID) | INTRAMUSCULAR | Status: DC | PRN

## 2021-10-23 MED ORDER — SENNOSIDES-DOCUSATE SODIUM 8.6-50 MG PO TABS: 1.0000 | ORAL_TABLET | Freq: Every evening | ORAL | Status: DC | PRN

## 2021-10-23 MED ORDER — ALBUTEROL SULFATE (2.5 MG/3ML) 0.083% IN NEBU: 3.0000 mL | INHALATION_SOLUTION | Freq: Four times a day (QID) | RESPIRATORY_TRACT | Status: DC | PRN

## 2021-10-23 MED ORDER — DIPHENHYDRAMINE HCL 12.5 MG/5ML PO ELIX: 12.5000 mg | ORAL_SOLUTION | Freq: Four times a day (QID) | ORAL | Status: DC | PRN

## 2021-10-23 MED ORDER — ACYCLOVIR 200 MG PO CAPS
400.0000 mg | ORAL_CAPSULE | Freq: Two times a day (BID) | ORAL | Status: DC
  Administered 2021-10-23 – 2021-11-01 (×18): 400 mg via ORAL
  Filled 2021-10-23 (×20): qty 2

## 2021-10-23 MED ORDER — FLEET ENEMA 7-19 GM/118ML RE ENEM: 1.0000 | ENEMA | Freq: Once | RECTAL | Status: DC | PRN

## 2021-10-23 MED ORDER — EZETIMIBE 10 MG PO TABS
10.0000 mg | ORAL_TABLET | Freq: Every day | ORAL | Status: DC
  Administered 2021-10-24 – 2021-11-01 (×9): 10 mg via ORAL
  Filled 2021-10-23 (×9): qty 1

## 2021-10-23 MED ORDER — ALUM & MAG HYDROXIDE-SIMETH 200-200-20 MG/5ML PO SUSP
30.0000 mL | ORAL | Status: DC | PRN
Start: 2021-10-23 — End: 2021-11-01
  Administered 2021-10-27: 30 mL via ORAL
  Filled 2021-10-23: qty 30

## 2021-10-23 MED ORDER — APIXABAN 5 MG PO TABS
5.0000 mg | ORAL_TABLET | Freq: Two times a day (BID) | ORAL | Status: DC
  Administered 2021-10-23 – 2021-11-01 (×18): 5 mg via ORAL
  Filled 2021-10-23 (×18): qty 1

## 2021-10-23 MED ORDER — METHOCARBAMOL 500 MG PO TABS: 500.0000 mg | ORAL_TABLET | Freq: Four times a day (QID) | ORAL | Status: DC | PRN

## 2021-10-23 MED ORDER — ONDANSETRON HCL 4 MG PO TABS
4.0000 mg | ORAL_TABLET | Freq: Three times a day (TID) | ORAL | Status: DC | PRN
  Administered 2021-10-27: 4 mg via ORAL
  Filled 2021-10-23: qty 1

## 2021-10-23 MED ORDER — ACETAMINOPHEN 325 MG PO TABS
325.0000 mg | ORAL_TABLET | ORAL | Status: DC | PRN
  Administered 2021-10-23: 650 mg via ORAL
  Filled 2021-10-23: qty 2

## 2021-10-23 MED ORDER — TRAMADOL HCL 50 MG PO TABS: 50.0000 mg | ORAL_TABLET | Freq: Two times a day (BID) | ORAL | Status: DC | PRN

## 2021-10-23 MED ORDER — GUAIFENESIN-DM 100-10 MG/5ML PO SYRP: 5.0000 mL | ORAL_SOLUTION | ORAL | Status: DC | PRN

## 2021-10-23 MED ORDER — AMIODARONE HCL 200 MG PO TABS
100.0000 mg | ORAL_TABLET | Freq: Two times a day (BID) | ORAL | Status: DC
  Administered 2021-10-23 – 2021-10-24 (×2): 100 mg via ORAL
  Filled 2021-10-23 (×2): qty 1

## 2021-10-23 MED ORDER — PROCHLORPERAZINE MALEATE 5 MG PO TABS: 5.0000 mg | ORAL_TABLET | Freq: Four times a day (QID) | ORAL | Status: DC | PRN

## 2021-10-23 MED ORDER — ALBUTEROL SULFATE HFA 108 (90 BASE) MCG/ACT IN AERS
2.0000 | INHALATION_SPRAY | RESPIRATORY_TRACT | Status: DC | PRN
  Administered 2021-10-27 – 2021-10-30 (×2): 2 via RESPIRATORY_TRACT
  Filled 2021-10-23: qty 6.7

## 2021-10-23 MED ORDER — MIRTAZAPINE 15 MG PO TABS
15.0000 mg | ORAL_TABLET | Freq: Every day | ORAL | Status: DC
  Administered 2021-10-23 – 2021-10-31 (×9): 15 mg via ORAL
  Filled 2021-10-23 (×9): qty 1

## 2021-10-23 MED ORDER — GUAIFENESIN-DM 100-10 MG/5ML PO SYRP: 5.0000 mL | ORAL_SOLUTION | Freq: Four times a day (QID) | ORAL | Status: DC | PRN

## 2021-10-23 NOTE — Discharge Summary (Signed)
?Physician Discharge Summary ?  ?Patient: [name] MRN: [id number] DOB: Mar 27, 1946  ?Admit date:     10/18/2021  ?Discharge date: 10/23/21  ?Discharge Physician: [name]  ? ?PCP: Michael Haven, MD  ? ?Recommendations at discharge:  ?Please obtain CBC and BMP in 1 week ?Follow-up with oncology in 1 to 2 weeks and do not restart Venclexta until day advise you to do so. ?Follow-up with primary care provider. ? ?Discharge Diagnoses: ?Principal Problem: ?  CVA (cerebral vascular accident) ([place]) ?Active Problems: ?  Chest pain ?  CAD (coronary artery disease) ?  Essential hypertension ?  Paroxysmal atrial fibrillation (HCC) ?  Chronic diastolic CHF (congestive heart failure) ([place]) ?  CLL (chronic lymphocytic leukemia) ([place]) ?  PTSD (post-traumatic stress disorder) ? ?Hospital Course: ?Taken from prior notes. ? ?[name] is a 76 y.o. male with medical history significant for HTN, HFpEF, recent severe debilitation following COVID infection in January, atrial fibrillation on Eliquis, s/p PPM and CLL was in his USOH until yesterday when his wife noted that he was unable to get out of bed as per usual.  He had clear left-sided weakness and was having slurred speech.  Patient refused to come to the ED because he was tired of being in the hospital.  This morning, patient's speech had cleared however given continued left-sided weakness he agreed to come to the ED. ?  ?In the ED patient was noted to have marked weakness on his left with NIH SS score of 4.  Head CT was negative for any bleed however patient was unable to have MRI or MRA due to pacemaker. ?  ?Patient states he is very tired and sleepy.  Patient's wife notes that he has had significant lethargy since his COVID infection.  [place] now barely likes to get out of bed.  He sleeps most of the time apparently.  She denies that there is been any new medications.  Notes his lethargy seems to be worsening over time.   Patient and wife both deny any fevers or chills, no nausea or vomiting, no diarrhea, he apparently has a very good appetite.  No dysuria ?  ?He has been evaluated by Neuro wth dx of CVA.  They did not feel transfer to [facility] for MRI in setting of pacer would change tx.   ? ?PT/OT is recommending CIR-pending insurance authorization. ? ?4/3: Patient developed substernal chest pain this morning, EKG without any acute changes and troponin x2 remains negative.  Pain feels like pressure but increased with deep breathing and movement.  Most likely atypical with some pleuritic and musculoskeletal component. ? ?4/4: Overnight some concern of increase lethargy, no change in neurologic exam. ?Still waiting for insurance authorization before moving to CIR. ? ?4/5: Patient remained stable with some improvement in neurologic deficiencies.  Insurance authorization came back and patient is being discharged for CIR. ?Saw the note from his oncologist that he should not restart Venclexta at this time due to recent stroke.  Patient need to follow-up closely with his oncologist for further recommendations. ?Patient also has intolerance to statin and takes cholesterol Shots, unable to find the name, by PCP every 2 weeks which he should continue. ? ? ? ? ?Assessment and Plan: ?* CVA (cerebral vascular accident) ([place]) ?CT head was negative, unable to obtain MRI due to pacemaker placement.  Neurology is treating like a stroke, does not think that transfer to [facility] for MRI is  necessary.Marland Kitchen ?PT/OT is recommending CIR-pending insurance authorization. ?CTA head and neck was without any acute abnormality or large vessel occlusion. ?A1c of 5.9 ?Lipid profile with LDL of 39 ?Will resume home dose of Eliquis from tomorrow ?We will discontinue aspirin after resuming Eliquis ? ?Chest pain ?Patient developed substernal chest pain this morning, feels like pressure, nonradiating, some associated shortness of breath.  Pain increased with deep breathing and  body movement. ?EKG done which was without any acute abnormality ?Check troponin-remain negative x2 ?Most likely atypical chest pain as it also has pleuritic and musculoskeletal component. ?-Continue to monitor ? ?CAD (coronary artery disease) ?Patient did had some chest pain this morning but it seems atypical with pleuritic and musculoskeletal component. ?-Continue aspirin and statin. ?-Discontinue aspirin after starting Eliquis tomorrow ?-Continue Imdur ?-Continue to monitor ? ?Essential hypertension ?Blood pressure within goal. ?-Continue Imdur ? ?Chronic diastolic CHF (congestive heart failure) ([place]) ?Clinically appears euvolemic. ?-Continue to monitor ? ?Paroxysmal atrial fibrillation (HCC) ?Currently in sinus rhythm. ?-Continue home amiodarone ?-We will resume Eliquis from tomorrow-it was held due to acute CVA. ? ?CLL (chronic lymphocytic leukemia) ([place]) ?- Being managed by outpatient oncology ?-Currently chronic medications are being held since he had COVID. ?-Daughter to discuss with his oncologist when to resume ? ?PTSD (post-traumatic stress disorder) ?- Continue home dose of Remeron and trazodone ? ? ?Consultants: Neurology ?Procedures performed: None ?Disposition: Rehabilitation facility ?Diet recommendation:  ?Discharge Diet Orders (From admission, onward)  ? ?  Start     Ordered  ? 10/23/21 0000  Diet - low sodium heart healthy       ? 10/23/21 1231  ? ?  ?  ? ?  ? ?Cardiac diet ?DISCHARGE MEDICATION: ?Allergies as of 10/23/2021   ?No Known Allergies ?  ? ?  ?Medication List  ?  ? ?STOP taking these medications   ? ?atorvastatin 80 MG tablet ?Commonly known as: LIPITOR ?  ?benzonatate 100 MG capsule ?Commonly known as: TESSALON ?  ?Venclexta 100 MG tablet ?Generic drug: venetoclax ?  ? ?  ? ?TAKE these medications   ? ?acetaminophen 500 MG tablet ?Commonly known as: TYLENOL ?Take 1,000 mg by mouth every 8 (eight) hours as needed for mild pain. ?  ?acyclovir 400 MG tablet ?Commonly known as: ZOVIRAX ?TAKE  1 TABLET(400 MG) BY MOUTH TWICE DAILY ?  ?albuterol 108 (90 Base) MCG/ACT inhaler ?Commonly known as: VENTOLIN HFA ?Inhale 2 puffs into the lungs every 6 (six) hours as needed for wheezing or shortness of breath. ?  ?amiodarone 200 MG tablet ?Commonly known as: PACERONE ?Take 1/2 tablet (100 mg) by mouth twice daily 5 days a week ?  ?apixaban 5 MG Tabs tablet ?Commonly known as: ELIQUIS ?Take 5 mg by mouth 2 (two) times daily. ?  ?azelastine 0.1 % nasal spray ?Commonly known as: ASTELIN ?Place 2 sprays into both nostrils 2 (two) times daily. Use in each nostril as directed ?  ?budesonide-formoterol 160-4.5 MCG/ACT inhaler ?Commonly known as: SYMBICORT ?Inhale 2 puffs into the lungs 2 (two) times daily. ?  ?cetirizine 10 MG tablet ?Commonly known as: ZYRTEC ?Take 10 mg by mouth daily as needed for allergies. ?  ?CoQ10 200 MG Caps ?Take 200 mg by mouth daily. ?  ?ezetimibe 10 MG tablet ?Commonly known as: ZETIA ?Take 1 tablet (10 mg total) by mouth daily. ?  ?furosemide 20 MG tablet ?Commonly known as: LASIX ?Take 2 tablets (40 mg total) by mouth daily. Do not take on Thursdays ?  ?guaiFENesin-dextromethorphan 100-10 MG/5ML  syrup ?Commonly known as: ROBITUSSIN DM ?Take 5 mLs by mouth every 4 (four) hours as needed for cough. ?  ?isosorbide mononitrate 30 MG 24 hr tablet ?Commonly known as: IMDUR ?Take 30 mg by mouth daily. ?  ?Krill Oil 350 MG Caps ?Take 350 mg by mouth daily as needed (Heart). ?  ?mirtazapine 15 MG tablet ?Commonly known as: REMERON ?Take 15 mg by mouth at bedtime as needed (for panic associated with PTSD). ?  ?multivitamin with minerals Tabs tablet ?Take 1 tablet by mouth daily. ?  ?nitroGLYCERIN 0.4 MG SL tablet ?Commonly known as: NITROSTAT ?Place 1 tablet (0.4 mg total) under the tongue every 5 (five) minutes as needed for chest pain. ?  ?Praluent 150 MG/ML Soaj ?Generic drug: Alirocumab ?Inject 150 mg into the skin every 14 (fourteen) days. ?  ?Tiotropium Bromide Monohydrate 2.5 MCG/ACT  Aers ?Inhale 2 puffs into the lungs at bedtime. Spiriva ?  ?traMADol 50 MG tablet ?Commonly known as: ULTRAM ?Take 1 tablet (50 mg total) by mouth every 12 (twelve) hours as needed. ?  ? ?  ? ? Follow-up Inf

## 2021-10-23 NOTE — H&P (Signed)
?Physical Medicine and Rehabilitation Admission H&P ?  ?  ?CC : Functional deficits due to ischemic stroke ?  ?HPI: [name] is a 76 year old male who was receiving home health physical therapy due to COVID-19 infection and debility.  On 10/17/2021, his physical therapist noticed worsening lethargy from baseline and lower extremity weakness with slurred speech. Refused to go to ED. Evaluated by Dr. Caryl Bis on 10/18/2021. Patient endorsed left-sided weakness although somewhat improved from the day prior.  Dr. Caryl Bis advised the patient and his wife of the possibility of stroke and advised EMS transportation to the emergency department.  They declined EMS and the patient's wife proceeded to take her husband to [facility] ED. NIH stroke scale on arrival 4 with left-sided weakness and dysmetria.  The patient was outside tPA window since symptoms started greater than 24 hours prior to presentation.  EKG unremarkable.  Patient maintained on Eliquis at home secondary to atrial fibrillation.  He was administered 325 mg of aspirin.  CTA of head negative for stroke.  Unable to perform MRI due to the patient's indwelling pacemaker.  CTA of the head and neck performed on 3/31 without evidence of large vessel occlusion.  Admitted to hospitalist service.  Neurology, Dr. Leonel Ramsay, consulted.  Eliquis held per his recommendation and aspirin 81 mg continued.  Lovenox 47.5 mg every 24 hours initiated for VTE prophylaxis.  After evaluation by Dr. Leonel Ramsay on 4/1, he suspected small ischemic stroke.  There was no convincing evidence to suggest that switching from one anticoagulant to another in the setting of treatment failure would provide any benefit.  He recommended restarting Eliquis for 5 days from the onset of symptoms and was restarted on 4/4. Speech improved; left sided weakness persists. The patient requires inpatient medicine and rehabilitation evaluations and services for ongoing dysfunction secondary to ischemic  stroke. ?  ?Echo: August 2022>EF 55-60% ?Hgb A1c = 5.9% ?LDL = 39 ?  ?ROS ?    ?Past Medical History:  ?Diagnosis Date  ? Anxiety    ? Arthritis    ? Atrial fibrillation ([place])    ?  a. Dx 2013, recurred 02/2014, CHA2DS2VASc = 3 -->placed on Eliquis;  b. 02/2014 Echo: EF 50-55%, mid and apical anterior septum and mid and apical inf septum are abnl, mild to mod Ao sclerosis w/o AS.  ? Cancer associated pain    ? Chicken pox    ? Chronic lymphocytic leukemia ([place])    ?  a. Dx 02/2014.  ? CLL (chronic lymphocytic leukemia) ([place])    ? Complication of anesthesia    ?  History of  PTSD--do not touch patient when waking up from surgery.  ? COPD (chronic obstructive pulmonary disease) ([place])    ? Coronary artery disease    ?  a. 04/2009 CABG x 3 (LIMA->LAD, VG->OM1, VG->PDA);  b. 09/2009 Cath: occluded VG x 2 w/ patent LIMA and L->R collats. EF 55%, mild antlat HK;  c. 10/2011 MV: EF 53%, no isch/infarct-->low risk.  ? Dysrhythmia    ?  hx of a-fib  ? GERD (gastroesophageal reflux disease)    ?  occasional  ? History of chemotherapy 2015-2016  ? HOH (hard of hearing)    ?  Bilateral Hearing Aids  ? Hypertension    ? Myocardial infarction [facility]) 2010  ? OSA on CPAP    ?  USE C-PAP  ? Presence of permanent cardiac pacemaker 2017  ? PTSD (post-traumatic stress disorder)    ? PTSD (post-traumatic stress disorder)    ?  Pure hypercholesterolemia    ? Rheumatic fever 1959  ? 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 TIA (transient ischemic attack) 11/02/2015  ?  ?     ?Past Surgical History:  ?Procedure Laterality Date  ? ABDOMINAL HERNIA REPAIR      ? APPENDECTOMY   06/21/1985  ? CARDIAC CATHETERIZATION   2010; 2011  ?  ; Dr Fletcher Anon  ? CORONARY ARTERY BYPASS GRAFT   04/2009  ?  "CABG X3"  ? EP IMPLANTABLE DEVICE N/A 03/03/2016  ?  Procedure: Pacemaker Implant;  Surgeon: Deboraha Sprang, MD;  Location: [facility];  Service: Cardiovascular;  Laterality: N/A;  ? [place]  ?  "shrapnel in my tailbone"  ?  INGUINAL HERNIA REPAIR Right    ? INSERT / REPLACE / REMOVE PACEMAKER      ? JOINT REPLACEMENT Right 2018  ? LAPAROSCOPIC CHOLECYSTECTOMY      ? RIGHT/LEFT HEART CATH AND CORONARY/GRAFT ANGIOGRAPHY N/A 02/04/2021  ?  Procedure: RIGHT/LEFT HEART CATH AND CORONARY/GRAFT ANGIOGRAPHY;  Surgeon: Nelva Bush, MD;  Location: [facility];  Service: Cardiovascular;  Laterality: N/A;  ? [place]  ? TOTAL HIP ARTHROPLASTY Right 10/22/2016  ?  Procedure: TOTAL HIP ARTHROPLASTY;  Surgeon: Dereck Leep, MD;  Location: [facility];  Service: Orthopedics;  Laterality: Right;  ? TOTAL HIP ARTHROPLASTY Left 11/04/2017  ?  Procedure: TOTAL HIP ARTHROPLASTY;  Surgeon: Dereck Leep, MD;  Location: [facility];  Service: Orthopedics;  Laterality: Left;  ?  ?     ?Family History  ?Problem Relation Age of Onset  ? Heart disease Mother    ? Heart attack Mother    ? Coronary artery disease Other    ?      family history  ?  ?Social History:  reports that he quit smoking about 15 years ago. His smoking use included cigarettes. He has a 40.00 pack-year smoking history. He has never used smokeless tobacco. He reports that he does not currently use alcohol. He reports that he does not use drugs. ?Allergies: No Known Allergies ?No medications prior to admission.  ?  ?  ?  ?  ?Home: ?Home Living ?Family/patient expects to be discharged to:: Private residence ?Living Arrangements: Spouse/significant other ?Available Help at Discharge: Family, Available 24 hours/day ?Type of Home: House ?Home Access: Ramped entrance ?Entrance Stairs-Number of Steps: 7 STE ?Entrance Stairs-Rails: Can reach both ?Home Layout: One level ?Bathroom Shower/Tub: Gaffer, Tub/shower unit ?Bathroom Toilet: Handicapped height ?Bathroom Accessibility: Yes ?Home Equipment: Rollator (4 wheels), Cane - single point, BSC/3in1, Civil engineer, contracting, Electric scooter ?Additional Comments: believes he has a 3in1 and shower seat at home ? Lives With:  Spouse ?  ?Functional History: ?Prior Function ?Prior Level of Function : Independent/Modified Independent, Driving, History of Falls (last six months) ?Mobility Comments: no falls since previous hospitalization; rollator/SPC home/community amb; electric scooter ?ADLs Comments: asssit fri IADLs; mod I-I with ADL, med managment; not driving since previous admission ?  ?Functional Status:  ?Mobility: ?Bed Mobility ?Overal bed mobility:  (not assessed as patient started and ended session in recliner) ?Bed Mobility: Supine to Sit ?Supine to sit: Min guard, HOB elevated ?Sit to supine: Min assist, HOB elevated ?General bed mobility comments: not assessed, pt in recliner pre/post session ?Transfers ?Overall transfer level: Needs assistance ?Equipment used: Rolling walker (2 wheels) ?Transfers: Sit to/from Stand ?Sit to Stand: Min guard ?Bed to/from chair/wheelchair/BSC transfer  type:: Step pivot ?Step pivot transfers: Min assist ?General transfer comment: Requires only MIN A for upward momentum from recliner ?Ambulation/Gait ?Ambulation/Gait assistance: Min guard, Min assist ?Gait Distance (Feet): 84 Feet ?Assistive device: Rolling walker (2 wheels) ?Gait Pattern/deviations: Decreased step length - right, Decreased step length - left, Decreased stride length, Step-through pattern, Antalgic ?General Gait Details: No LE buckling noted during ambualtion bout, however remained CGA for safety ?Gait velocity: decreased ?  ?ADL: ?ADL ?Overall ADL's : Needs assistance/impaired ?Eating/Feeding: Set up, Sitting ?Grooming: Wash/dry hands, Wash/dry face, Oral care, Minimal assistance, Standing ?Grooming Details (indicate cue type and reason): Initially requires MIN GUARD during standing grooming tasks, however requires MIN-MOD A when fatigued. Required x1 seated rest break d/t decreased activity tolerance ?Upper Body Bathing: Set up, Sitting ?Upper Body Bathing Details (indicate cue type and reason): cueing for initiation at edge of  bed ?Lower Body Bathing: Maximal assistance ?Lower Body Bathing Details (indicate cue type and reason): anticipated ?Upper Body Dressing : Moderate assistance, Sitting ?Upper Body Dressing Details (ind

## 2021-10-23 NOTE — Progress Notes (Addendum)
Inpatient Rehabilitation Admissions Coordinator  ? ?I have received insurance approval for CIR and can admit today.I contacted acute team and TOC and will make the arrangements to admit today. Dr Letta Pate will be admitting rehab MD. I will make the arrangements with [facility] for transportation when I have verified Rehab room number. ? ?Danne Baxter, RN, MSN ?Rehab Admissions Coordinator ?[phone] ?10/23/2021 11:36 AM ? [address] is room number and patient and his wife ar aware and in agreement. ? ?Danne Baxter, RN, MSN ?Rehab Admissions Coordinator ?[phone] ?10/23/2021 12:00 PM ? ?

## 2021-10-23 NOTE — Telephone Encounter (Signed)
Call returned to wife and informed not to restart Venclexta since he had the stroke. She voiced understanding ?

## 2021-10-23 NOTE — Progress Notes (Signed)
Pt arrived to unit via EMS from [facility]. Admission assessment completed. Pt has bilateral hearing aids, watch, and cellphone at bedside. Pt called family and updated them. Pt oriented to unit. Instructed to call before getting out of bed, verbalizes understanding. Bed locked and in lowest position, call bell within reach.  ?

## 2021-10-23 NOTE — Progress Notes (Signed)
Kirsteins, Luanna Salk, MD  ?Physician ?Physical Medicine and Rehabilitation ?PMR Pre-admission    ?Signed ?Date of Service:  10/20/2021  8:31 AM ? Related encounter: ED to Hosp-Admission (Discharged) from 10/18/2021 in [place] ?  ?Signed    ?  ?Show:Clear all ?[x] Written[x] Templated[] Copied ? ?Added by: ?[x] Cristina Gong, RN[x] Lind Covert, Lauren Mamie Nick, CCC-SLP[x] Charlett Blake, MD ? ?[] Hover for details ?   ?   ?   ?   ?   ?   ?   ?   ?   ?   ?   ?   ?   ?   ?   ?   ?   ?   ?   ?   ?   ?   ?   ?   ?   ?   ?   ?   ?   ?   ?   ?   ?   ?   ?   ?   ?   ?   ?   ?   ?   ?   ?   ?   ?   ?   ?   ?   ?   ?   ?   ?   ?   ?   ?   ?   ?   ?   ?   ?   ?   ?   ?   ?   ?   ?   ?   ?   ?   ?   ?   ?   ?   ?   ?   ?   ?   ?   ?   ?   ?   ?   ?   ?   ?   ?   ?   ?   ?   ?   ?   ?   ?   ?   ?   ?   ?   ?   ?   ?   ?   ?   ?   ?   ?   ?   ?   ?   ?   ?   ?   ?   ?   ?   ?   ?   ?   ?   ?   ?   ?   ?   ?   ?   ?   ?   ?   ?   ?   ?   ?   ?   ?   ?PMR Admission Coordinator Pre-Admission Assessment ?  ?Patient: [name] is an 76 y.o., male ?MRN: [id number] ?DOB: 1945/07/22 ?Height: 5\' 9"  (175.3 cm) ?Weight: 96.5 kg ?  ?Insurance Information ?HMO: yes    PPO:      PCP:      IPA:      80/20:      OTHER:  ?PRIMARY: [employer] Medicare      Policy#: [id number]      Subscriber: patient ?CM Name: Arville Go   Phone#: [phone]     Fax#: [phone] ?Pre-Cert#: [id number]   approved for 7 days with f/u with Dewitt Rota phone [phone] fax [phone]   Employer:  ?Benefits:  Phone #: [phone]     Name: 4/3 ?Eff. Date: 07/21/20-still active  Deduct: does not have one      Out of Pocket Max: $1,000 ($460 met) ?Life Max: NA ?CIR: $150 co-pay/admission      SNF: 100% coverage ?Outpatient: $10-20/visit co-pay     Co-Pay:  ?Home Health: 100% coverage      Co-Pay:  ?DME: 100% coverage     Co-Pay:  ?Providers: in-network ?SECONDARY:       Policy#:      Phone#:  ?  ?Financial Counselor:        Phone#:  ?  ?The ?Data Collection Information Summary? for patients in Inpatient Rehabilitation Facilities with attached ?Privacy Act [place] Records? was provided and verbally reviewed with: Patient and Family ?  ?Emergency Contact Information ?Contact Information   ?  ?  Name Relation Home Work Mobile  ?  [place] [phone]   [phone]  ?  Scott,Dana Daughter [phone]   [phone]  ?  ?   ?  ?Current Medical History  ?Patient Admitting Diagnosis: CVA ?  ?History of Present Illness: 76 year old male with history for HTN, HF pEF, recent severe debilitation after COVID infection in January, Atrial fibrillation on Eliquis, s/p PPM and CLL who presented on 10/18/21 when his wife noted he was unable to get out of bed, left sided weakness and slurred speech. Head CT was negative for bleed however unable to have an MRI or MRA due to his pacemaker. ?  ?Neurology consulted with diagnosis of CVA. Did not transfer to [facility] for MRI in setting of pacer for felt would not change his treatment. CTA head and neck without acute abnormality or large vessel occlusion. A1c of 5.9. LDL 39. Plans to discontinue ASA after resuming his Eliquis. ?  ?Developed substernal chest pain increased with deep breathing and body movement. EKG negative. Troponins negative. Felt to be pleuritic and musculoskeletal component.  ?  ?HTN within goal and to continue Imdur. Chronic diastolic CHF appears euvolemic. Continue home amiodarone for atrial fibrillation. CLL managed by outpatient oncology. Currently chronic meds are begin held since he had COVID. To follow up with oncology as an outpatient.  ?  ?PTSD on home dose of Remeron and Trazodone. ?  ?Complete NIHSS TOTAL: 5 ?  ?Patient's medical record from [facility] has been reviewed by the rehabilitation admission coordinator and physician. ?  ?Past Medical History  ?    ?Past Medical History:  ?Diagnosis Date  ? Anxiety    ? Arthritis    ?  Atrial fibrillation ([place])    ?  a. Dx 2013, recurred 02/2014, CHA2DS2VASc = 3 -->placed on Eliquis;  b. 02/2014 Echo: EF 50-55%, mid and apical anterior septum and mid and apical inf septum are abnl, mild to mod Ao sclerosis w/o AS.  ? Cancer associated pain    ? Chicken pox    ? Chronic lymphocytic leukemia ([place])    ?  a. Dx 02/2014.  ? CLL (chronic lymphocytic leukemia) ([place])    ? Complication of anesthesia    ?  History of  PTSD--do not touch patient when waking up from surgery.  ? COPD (chronic obstructive pulmonary disease) ([place])    ? Coronary artery disease    ?  a. 04/2009 CABG x 3 (LIMA->LAD, VG->OM1, VG->PDA);  b. 09/2009 Cath: occluded VG x 2 w/ patent LIMA and L->R collats. EF 55%, mild antlat HK;  c. 10/2011 MV: EF 53%, no isch/infarct-->low risk.  ? Dysrhythmia    ?  hx of a-fib  ?  GERD (gastroesophageal reflux disease)    ?  occasional  ? History of chemotherapy 2015-2016  ? HOH (hard of hearing)    ?  Bilateral Hearing Aids  ? Hypertension    ? Myocardial infarction [facility]) 2010  ? OSA on CPAP    ?  USE C-PAP  ? Presence of permanent cardiac pacemaker 2017  ? PTSD (post-traumatic stress disorder)    ? PTSD (post-traumatic stress disorder)    ? Pure hypercholesterolemia    ? Rheumatic fever 1959  ? 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 TIA (transient ischemic attack) 11/02/2015  ?  ?Has the patient had major surgery during 100 days prior to admission? No ?  ?Family History   ?family history includes Coronary artery disease in an other family member; Heart attack in his mother; Heart disease in his mother. ?  ?Current Medications ?  ?Current Facility-Administered Medications:  ?  acetaminophen (TYLENOL) tablet 650 mg, 650 mg, Oral, Q4H PRN, 650 mg at 10/22/21 2135 **OR** acetaminophen (TYLENOL) 160 MG/5ML solution 650 mg, 650 mg, Per Tube, Q4H PRN **OR** acetaminophen (TYLENOL) suppository 650 mg, 650 mg, Rectal, Q4H PRN, Jamse Arn, Kyra Searles, MD ?  acyclovir (ZOVIRAX) 200 MG capsule 400  mg, 400 mg, Oral, BID, Bonnell Public Tublu, MD, 400 mg at 10/23/21 0847 ?  albuterol (PROVENTIL) (2.5 MG/3ML) 0.083% nebulizer solution 3 mL, 3 mL, Inhalation, Q6H PRN, Vashti Hey, MD ?  amiodarone (PACERONE) tablet 100 mg, 100 mg, Oral, BID, Bonnell Public Tublu, MD, 100 mg at 10/23/21 0847 ?  apixaban (ELIQUIS) tablet 5 mg, 5 mg, Oral, BID, Lorella Nimrod, MD, 5 mg at 10/23/21 0848 ?  ezetimibe (ZETIA) tablet 10 mg, 10 mg, Oral, Daily, Bonnell Public Tublu, MD, 10 mg at 10/23/21 0848 ?  feeding supplement (BOOST / RESOURCE BREEZE) liquid 1 Container, 1 Container, Oral, TID BM, Lorella Nimrod, MD, 1 Container at 10/23/21 0845 ?  furosemide (LASIX) tablet 40 mg, 40 mg, Oral, Daily, Bonnell Public Tublu, MD, 40 mg at 10/23/21 0848 ?  guaiFENesin-dextromethorphan (ROBITUSSIN DM) 100-10 MG/5ML syrup 5 mL, 5 mL, Oral, Q4H PRN, Lorella Nimrod, MD, 5 mL at 10/23/21 0846 ?  isosorbide mononitrate (IMDUR) 24 hr tablet 30 mg, 30 mg, Oral, Daily, Bonnell Public Tublu, MD, 30 mg at 10/23/21 0846 ?  mirtazapine (REMERON) tablet 15 mg, 15 mg, Oral, QHS PRN, Vashti Hey, MD, 15 mg at 10/22/21 2134 ?  mometasone-formoterol (DULERA) 200-5 MCG/ACT inhaler 2 puff, 2 puff, Inhalation, BID, Bonnell Public Tublu, MD, 2 puff at 10/23/21 0849 ?  nitroGLYCERIN (NITROSTAT) SL tablet 0.4 mg, 0.4 mg, Sublingual, Q5 min PRN, Lorella Nimrod, MD, 0.4 mg at 10/21/21 0948 ?  tiotropium (SPIRIVA) inhalation capsule ([facility] use ONLY) 18 mcg, 1 capsule, Inhalation, QHS, Chatterjee, Dewaine Oats Tublu, MD ?  traMADol (ULTRAM) tablet 50 mg, 50 mg, Oral, Q12H PRN, Marcell Anger, MD, 50 mg at 10/23/21 0846 ?  ?Patients Current Diet:  ?Diet Order   ?  ?         ?    Diet Heart Room service appropriate? Yes; Fluid consistency: Thin  Diet effective now       ?  ?  ?   ?  ?  ?   ?  ?Precautions / Restrictions ?Precautions ?Precautions: Fall ?Restrictions ?Weight Bearing Restrictions: No  ?  ?Has the  patient had 2 or more falls or a fall with injury in the past year? Yes ?  ?Prior Activity Level ?Limited  Community (1-2x/wk): gets out of house ~1day/week ?  ?Prior Functional Level ?Self Care: Did the pa

## 2021-10-23 NOTE — H&P (Incomplete)
? ? ?Physical Medicine and Rehabilitation Admission H&P ? ?  ?CC : Functional deficits due to ischemic stroke ? ?HPI: [name] is a 76 year old male who was receiving home health physical therapy due to COVID-19 infection and debility.  On 10/17/2021, his physical therapist noticed worsening lethargy from baseline and lower extremity weakness with slurred speech. Refused to go to ED. Evaluated by Dr. Caryl Bis on 10/18/2021. Patient endorsed left-sided weakness although somewhat improved from the day prior.  Dr. Caryl Bis advised the patient and his wife of the possibility of stroke and advised EMS transportation to the emergency department.  They declined EMS and the patient's wife proceeded to take her husband to [facility] ED. NIH stroke scale on arrival 4 with left-sided weakness and dysmetria.  The patient was outside tPA window since symptoms started greater than 24 hours prior to presentation.  EKG unremarkable.  Patient maintained on Eliquis at home secondary to atrial fibrillation.  He was administered 325 mg of aspirin.  CTA of head negative for stroke.  Unable to perform MRI due to the patient's indwelling pacemaker.  CTA of the head and neck performed on 3/31 without evidence of large vessel occlusion.  Admitted to hospitalist service.  Neurology, Dr. Leonel Ramsay, consulted.  Eliquis held per his recommendation and aspirin 81 mg continued.  Lovenox 47.5 mg every 24 hours initiated for VTE prophylaxis.  After evaluation by Dr. Leonel Ramsay on 4/1, he suspected small ischemic stroke.  There was no convincing evidence to suggest that switching from one anticoagulant to another in the setting of treatment failure would provide any benefit.  He recommended restarting Eliquis for 5 days from the onset of symptoms and was restarted on 4/4. Speech improved; left sided weakness persists. The patient requires inpatient medicine and rehabilitation evaluations and services for ongoing dysfunction secondary to ischemic  stroke. ? ?Echo: August 2022>EF 55-60% ?Hgb A1c = 5.9% ?LDL = 39 ? ?ROS ?Past Medical History:  ?Diagnosis Date  ? Anxiety   ? Arthritis   ? Atrial fibrillation ([place])   ? a. Dx 2013, recurred 02/2014, CHA2DS2VASc = 3 -->placed on Eliquis;  b. 02/2014 Echo: EF 50-55%, mid and apical anterior septum and mid and apical inf septum are abnl, mild to mod Ao sclerosis w/o AS.  ? Cancer associated pain   ? Chicken pox   ? Chronic lymphocytic leukemia ([place])   ? a. Dx 02/2014.  ? CLL (chronic lymphocytic leukemia) ([place])   ? Complication of anesthesia   ? History of  PTSD--do not touch patient when waking up from surgery.  ? COPD (chronic obstructive pulmonary disease) ([place])   ? Coronary artery disease   ? a. 04/2009 CABG x 3 (LIMA->LAD, VG->OM1, VG->PDA);  b. 09/2009 Cath: occluded VG x 2 w/ patent LIMA and L->R collats. EF 55%, mild antlat HK;  c. 10/2011 MV: EF 53%, no isch/infarct-->low risk.  ? Dysrhythmia   ? hx of a-fib  ? GERD (gastroesophageal reflux disease)   ? occasional  ? History of chemotherapy 2015-2016  ? HOH (hard of hearing)   ? Bilateral Hearing Aids  ? Hypertension   ? Myocardial infarction [facility]) 2010  ? OSA on CPAP   ? USE C-PAP  ? Presence of permanent cardiac pacemaker 2017  ? PTSD (post-traumatic stress disorder)   ? PTSD (post-traumatic stress disorder)   ? Pure hypercholesterolemia   ? Rheumatic fever 1959  ? 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 TIA (transient ischemic attack) 11/02/2015  ? ?Past  Surgical History:  ?Procedure Laterality Date  ? ABDOMINAL HERNIA REPAIR    ? APPENDECTOMY  06/21/1985  ? CARDIAC CATHETERIZATION  2010; 2011  ? ; Dr Fletcher Anon  ? CORONARY ARTERY BYPASS GRAFT  04/2009  ? "CABG X3"  ? EP IMPLANTABLE DEVICE N/A 03/03/2016  ? Procedure: Pacemaker Implant;  Surgeon: Deboraha Sprang, MD;  Location: [place];  Service: Cardiovascular;  Laterality: N/A;  ? [place]  ? "shrapnel in my tailbone"  ? INGUINAL HERNIA REPAIR Right   ? INSERT / REPLACE /  REMOVE PACEMAKER    ? JOINT REPLACEMENT Right 2018  ? LAPAROSCOPIC CHOLECYSTECTOMY    ? RIGHT/LEFT HEART CATH AND CORONARY/GRAFT ANGIOGRAPHY N/A 02/04/2021  ? Procedure: RIGHT/LEFT HEART CATH AND CORONARY/GRAFT ANGIOGRAPHY;  Surgeon: Nelva Bush, MD;  Location: [place] [facility];  Service: Cardiovascular;  Laterality: N/A;  ? [place]  ? TOTAL HIP ARTHROPLASTY Right 10/22/2016  ? Procedure: TOTAL HIP ARTHROPLASTY;  Surgeon: Dereck Leep, MD;  Location: [facility];  Service: Orthopedics;  Laterality: Right;  ? TOTAL HIP ARTHROPLASTY Left 11/04/2017  ? Procedure: TOTAL HIP ARTHROPLASTY;  Surgeon: Dereck Leep, MD;  Location: [facility];  Service: Orthopedics;  Laterality: Left;  ? ?Family History  ?Problem Relation Age of Onset  ? Heart disease Mother   ? Heart attack Mother   ? Coronary artery disease Other   ?     family history  ? ?Social History:  reports that he quit smoking about 15 years ago. His smoking use included cigarettes. He has a 40.00 pack-year smoking history. He has never used smokeless tobacco. He reports that he does not currently use alcohol. He reports that he does not use drugs. ?Allergies: No Known Allergies ?No medications prior to admission.  ? ? ? ? ?Home: ?Home Living ?Family/patient expects to be discharged to:: Private residence ?Living Arrangements: Spouse/significant other ?Available Help at Discharge: Family, Available 24 hours/day ?Type of Home: House ?Home Access: Ramped entrance ?Entrance Stairs-Number of Steps: 7 STE ?Entrance Stairs-Rails: Can reach both ?Home Layout: One level ?Bathroom Shower/Tub: Gaffer, Tub/shower unit ?Bathroom Toilet: Handicapped height ?Bathroom Accessibility: Yes ?Home Equipment: Rollator (4 wheels), Cane - single point, BSC/3in1, Civil engineer, contracting, Electric scooter ?Additional Comments: believes he has a 3in1 and shower seat at home ? Lives With: Spouse ?  ?Functional History: ?Prior Function ?Prior Level of Function :  Independent/Modified Independent, Driving, History of Falls (last six months) ?Mobility Comments: no falls since previous hospitalization; rollator/SPC home/community amb; electric scooter ?ADLs Comments: asssit fri IADLs; mod I-I with ADL, med managment; not driving since previous admission ? ?Functional Status:  ?Mobility: ?Bed Mobility ?Overal bed mobility:  (not assessed as patient started and ended session in recliner) ?Bed Mobility: Supine to Sit ?Supine to sit: Min guard, HOB elevated ?Sit to supine: Min assist, HOB elevated ?General bed mobility comments: not assessed, pt in recliner pre/post session ?Transfers ?Overall transfer level: Needs assistance ?Equipment used: Rolling walker (2 wheels) ?Transfers: Sit to/from Stand ?Sit to Stand: Min guard ?Bed to/from chair/wheelchair/BSC transfer type:: Step pivot ?Step pivot transfers: Min assist ?General transfer comment: Requires only MIN A for upward momentum from recliner ?Ambulation/Gait ?Ambulation/Gait assistance: Min guard, Min assist ?Gait Distance (Feet): 84 Feet ?Assistive device: Rolling walker (2 wheels) ?Gait Pattern/deviations: Decreased step length - right, Decreased step length - left, Decreased stride length, Step-through pattern, Antalgic ?General Gait Details: No LE buckling noted during ambualtion bout, however remained CGA for  safety ?Gait velocity: decreased ?  ? ?ADL: ?ADL ?Overall ADL's : Needs assistance/impaired ?Eating/Feeding: Set up, Sitting ?Grooming: Wash/dry hands, Wash/dry face, Oral care, Minimal assistance, Standing ?Grooming Details (indicate cue type and reason): Initially requires MIN GUARD during standing grooming tasks, however requires MIN-MOD A when fatigued. Required x1 seated rest break d/t decreased activity tolerance ?Upper Body Bathing: Set up, Sitting ?Upper Body Bathing Details (indicate cue type and reason): cueing for initiation at edge of bed ?Lower Body Bathing: Maximal assistance ?Lower Body Bathing Details  (indicate cue type and reason): anticipated ?Upper Body Dressing : Moderate assistance, Sitting ?Upper Body Dressing Details (indicate cue type and reason): donn gown at edge of bed ?Lower Body Dressing

## 2021-10-23 NOTE — Progress Notes (Signed)
Occupational Therapy Treatment ?Patient Details ?Name: [name] ?MRN: [id number] ?DOB: 16-Jul-1946 ?Today's Date: 10/23/2021 ? ? ?History of present illness Pt is a 76 year old male admitted with with left-sided weakness and also slurred speech. PMH significant for history of A-fib on Eliquis, CLL, COPD, CAD status post CABG, OSA on CPAP, hypertension, hyperlipidemia, sick sinus syndrome status post pacemaker who presents for evaluation of left-sided weakness.  Patient has had chronic shortness of breath since January when he had COVID but denies any acute changes. ?  ?OT comments ? Pt seen for OT treatment on this date. Upon arrival to room, pt asleep in bed however easily awoken and agreeable to OT tx. Pt's supportive son present throughout session. Pt performed supine>sit transfer requiring MIN GUARD with HOB elevated. Once seated EOB, pt demonstrated FAIR static sitting balance. Pt performed sit>stand transfer and initiated standing grooming tasks, initially only requiring MIN GUARD however when fatigued required MIN-MOD A for standing balance during grooming tasks. Pt performed x3 standing grooming tasks, requiring x2 rest breaks before walking 55f with RW and MIN A. Once seated in recliner, pt returned demo'd theraputty HEP independently. Pt is making good progress toward goals and continues to benefit from skilled OT services to maximize return to PLOF and minimize risk of future falls, injury, caregiver burden, and readmission. Will continue to follow POC. Discharge recommendation remains appropriate.    ? ?Recommendations for follow up therapy are one component of a multi-disciplinary discharge planning process, led by the attending physician.  Recommendations may be updated based on patient status, additional functional criteria and insurance authorization. ?   ?Follow Up Recommendations ? Acute inpatient rehab (3hours/day)  ?  ?Assistance Recommended at Discharge Frequent or constant  Supervision/Assistance  ?Patient can return home with the following ? A lot of help with walking and/or transfers;A lot of help with bathing/dressing/bathroom;Assist for transportation;Assistance with cooking/housework;Direct supervision/assist for financial management;Help with stairs or ramp for entrance;Direct supervision/assist for medications management ?  ?Equipment Recommendations ? Other (comment) (pt has recommended equipment, defer to next venue of care)  ?  ?   ?Precautions / Restrictions Precautions ?Precautions: Fall ?Restrictions ?Weight Bearing Restrictions: No  ? ? ?  ? ?Mobility Bed Mobility ?  ?Bed Mobility: Supine to Sit ?  ?  ?Supine to sit: Min guard, HOB elevated ?  ?  ?  ?  ? ?Transfers ?Overall transfer level: Needs assistance ?Equipment used: Rolling walker (2 wheels) ?Transfers: Sit to/from Stand ?Sit to Stand: Min guard ?  ?  ?  ?  ?  ?  ?  ?  ?Balance Overall balance assessment: Needs assistance ?Sitting-balance support: No upper extremity supported, Feet supported ?Sitting balance-Leahy Scale: Fair ?  ?  ?Standing balance support: Single extremity supported, During functional activity ?Standing balance-Leahy Scale: Poor ?Standing balance comment: Initially requires MIN GUARD during standing grooming tasks, however requires MIN-MOD A when fatigued ?  ?  ?  ?  ?  ?  ?  ?  ?  ?  ?  ?   ? ?ADL either performed or assessed with clinical judgement  ? ?ADL Overall ADL's : Needs assistance/impaired ?  ?  ?Grooming: Wash/dry hands;Wash/dry face;Oral care;Minimal assistance;Standing ?Grooming Details (indicate cue type and reason): Initially requires MIN GUARD during standing grooming tasks, however requires MIN-MOD A when fatigued. Required x1 seated rest break d/t decreased activity tolerance ?  ?  ?  ?  ?  ?  ?  ?  ?  ?  ?  ?  ?  ?  ?  ?  ?  ? ? ? ?  Cognition Arousal/Alertness: Awake/alert ?Behavior During Therapy: [facility] for tasks assessed/performed ?Overall Cognitive Status: Impaired/Different  from baseline ?  ?  ?  ?  ?  ?  ?  ?  ?  ?  ?  ?  ?  ?  ?  ?  ?General Comments: A&Ox4. Requires increased processing time. ?  ?  ?   ?   ?   ?   ? ? ?Pertinent Vitals/ Pain       Pain Assessment ?Pain Assessment: No/denies pain ? ?   ?   ? ?Frequency ? Min 3X/week  ? ? ? ? ?  ?Progress Toward Goals ? ?OT Goals(current goals can now be found in the care plan section) ? Progress towards OT goals: Progressing toward goals ? ?Acute Rehab OT Goals ?Patient Stated Goal: to get back to making wood carvings ?OT Goal Formulation: With patient ?Time For Goal Achievement: 11/02/21 ?Potential to Achieve Goals: Good  ?Plan Discharge plan remains appropriate;Frequency remains appropriate   ? ?   ?AM-PAC OT "6 Clicks" Daily Activity     ?Outcome Measure ? ? Help from another person eating meals?: None ?Help from another person taking care of personal grooming?: A Little ?Help from another person toileting, which includes using toliet, bedpan, or urinal?: A Lot ?Help from another person bathing (including washing, rinsing, drying)?: A Lot ?Help from another person to put on and taking off regular upper body clothing?: A Lot ?Help from another person to put on and taking off regular lower body clothing?: A Little ?6 Click Score: 16 ? ?  ?End of Session Equipment Utilized During Treatment: Rolling walker (2 wheels) ? 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  ?Activity Tolerance Patient tolerated treatment well ?  ?Patient Left in chair;with call bell/phone within reach;with chair alarm set;with family/visitor present ?  ?Nurse Communication Mobility status ?  ? ?   ? ?Time: 3212-2482 ?OT Time Calculation (min): 27 min ? ?Charges: OT General Charges ?$OT Visit: 1 Visit ?OT Treatments ?$Self Care/Home  Management : 23-37 mins ? ?Fredirick Maudlin, OTR/L ?[place] ? ?

## 2021-10-23 NOTE — Progress Notes (Signed)
Inpatient Rehabilitation Admissions Coordinator  ? ?I spoke with his wife by phone to update on insurance process. I continue to await [employer] determination for a possible [facility] admit. ? ?Danne Baxter, RN, MSN ?Rehab Admissions Coordinator ?[phone] ?10/23/2021 8:16 AM ? ?

## 2021-10-23 NOTE — Progress Notes (Signed)
Inpatient Rehabilitation Admission Medication Review by a Pharmacist ? ?A complete drug regimen review was completed for this patient to identify any potential clinically significant medication issues. ? ?High Risk Drug Classes Is patient taking? Indication by Medication  ?Antipsychotic No   ?Anticoagulant Yes Apixaban- AF  ?Antibiotic No   ?Opioid Yes Tramdol- acute pain  ?Antiplatelet No   ?Hypoglycemics/insulin No   ?Vasoactive Medication Yes Amiodarone, lasix, imdur, NitroSL- rate ctonrol, hypertension, angina  ?Chemotherapy No   ?Other Yes Zetia- HLD ?Remeron- sleep/ MDD ?Zovirax- cold sore prevention  ? ? ? ?Type of Medication Issue Identified Description of Issue Recommendation(s)  ?Drug Interaction(s) (clinically significant) ?    ?Duplicate Therapy ?    ?Allergy ?    ?No Medication Administration End Date ?    ?Incorrect Dose ?    ?Additional Drug Therapy Needed ?    ?Significant med changes from prior encounter (inform family/care partners about these prior to discharge).    ?Other ? PTA meds: ?Astelin ?Symbicort ?venclexta Restart PTA meds at the time of discharge from CIR if warranted or during CIR admission if needed. Symbicort has been interchanged to [facility] and should be continued at discharge in place of dulera ? ?Venclexta has been placed on hold 2/2 stroke  ? ? ?Clinically significant medication issues were identified that warrant physician communication and completion of prescribed/recommended actions by midnight of the next day:  No ? ? ?Time spent performing this drug regimen review (minutes):  30 ? ? ?Twanna Resh BS, PharmD, BCPS ?Clinical Pharmacist ?10/23/2021 1:45 PM ?

## 2021-10-23 NOTE — Telephone Encounter (Signed)
[name], patient is still inpatient.  Should he continue to hold the Venetoclax at this time? ?

## 2021-10-24 ENCOUNTER — Inpatient Hospital Stay (HOSPITAL_COMMUNITY): Payer: Medicare HMO

## 2021-10-24 DIAGNOSIS — I5032 Chronic diastolic (congestive) heart failure: Secondary | ICD-10-CM

## 2021-10-24 DIAGNOSIS — M4712 Other spondylosis with myelopathy, cervical region: Secondary | ICD-10-CM

## 2021-10-24 DIAGNOSIS — I10 Essential (primary) hypertension: Secondary | ICD-10-CM

## 2021-10-24 LAB — CBC WITH DIFFERENTIAL/PLATELET
Abs Immature Granulocytes: 0.15 10*3/uL — ABNORMAL HIGH (ref 0.00–0.07)
Basophils Absolute: 0 10*3/uL (ref 0.0–0.1)
Basophils Relative: 1 %
Eosinophils Absolute: 0.1 10*3/uL (ref 0.0–0.5)
Eosinophils Relative: 1 %
HCT: 30.1 % — ABNORMAL LOW (ref 39.0–52.0)
Hemoglobin: 9.8 g/dL — ABNORMAL LOW (ref 13.0–17.0)
Immature Granulocytes: 2 %
Lymphocytes Relative: 26 %
Lymphs Abs: 2 10*3/uL (ref 0.7–4.0)
MCH: 33.9 pg (ref 26.0–34.0)
MCHC: 32.6 g/dL (ref 30.0–36.0)
MCV: 104.2 fL — ABNORMAL HIGH (ref 80.0–100.0)
Monocytes Absolute: 0.8 10*3/uL (ref 0.1–1.0)
Monocytes Relative: 10 %
Neutro Abs: 4.7 10*3/uL (ref 1.7–7.7)
Neutrophils Relative %: 60 %
Platelets: 207 10*3/uL (ref 150–400)
RBC: 2.89 MIL/uL — ABNORMAL LOW (ref 4.22–5.81)
RDW: 17.2 % — ABNORMAL HIGH (ref 11.5–15.5)
WBC: 7.7 10*3/uL (ref 4.0–10.5)
nRBC: 0 % (ref 0.0–0.2)

## 2021-10-24 LAB — COMPREHENSIVE METABOLIC PANEL
ALT: 36 U/L (ref 0–44)
AST: 27 U/L (ref 15–41)
Albumin: 2.5 g/dL — ABNORMAL LOW (ref 3.5–5.0)
Alkaline Phosphatase: 103 U/L (ref 38–126)
Anion gap: 8 (ref 5–15)
BUN: 11 mg/dL (ref 8–23)
CO2: 26 mmol/L (ref 22–32)
Calcium: 8.3 mg/dL — ABNORMAL LOW (ref 8.9–10.3)
Chloride: 104 mmol/L (ref 98–111)
Creatinine, Ser: 0.81 mg/dL (ref 0.61–1.24)
GFR, Estimated: >60 mL/min (ref >60)
Glucose, Bld: 129 mg/dL — ABNORMAL HIGH (ref 70–99)
Potassium: 3.7 mmol/L (ref 3.5–5.1)
Sodium: 138 mmol/L (ref 135–145)
Total Bilirubin: 0.5 mg/dL (ref 0.3–1.2)
Total Protein: 5.4 g/dL — ABNORMAL LOW (ref 6.5–8.1)

## 2021-10-24 IMAGING — CT CT CERVICAL SPINE W/O CM
3 series · 12 of 33 positions shown, 14 images · non-contrast
Comparison: CTA of the neck [DATE]

CLINICAL DATA: Cervical radiculopathy. No red flag symptoms.
Spasticity in the lower more than upper extremities



[Series 5: c_spine 2.0 st · axial · 0.40mm/px · z∈[-306,-160]mm · 4 of 107 slices shown, 5 images]
[im 17/107  soft-tissue]
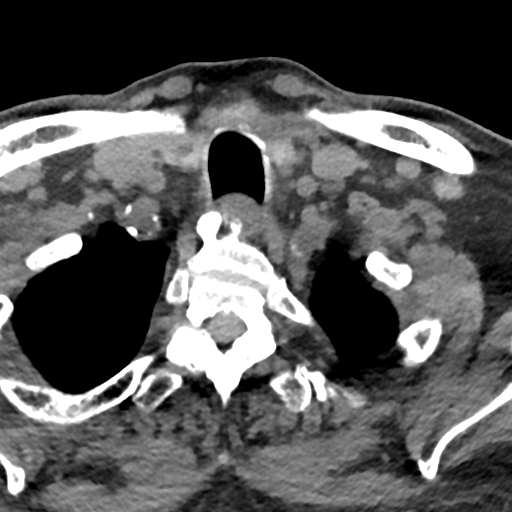
[im 17/107  bone]
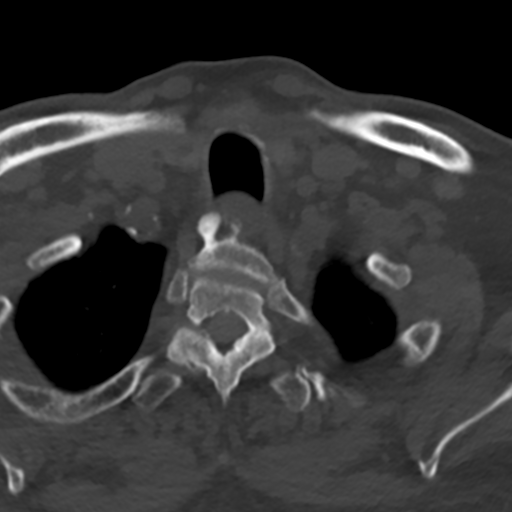
[im 41/107  bone]
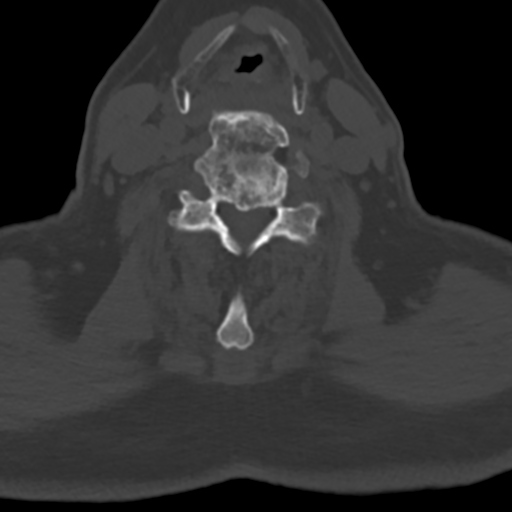
[im 66/107  bone]
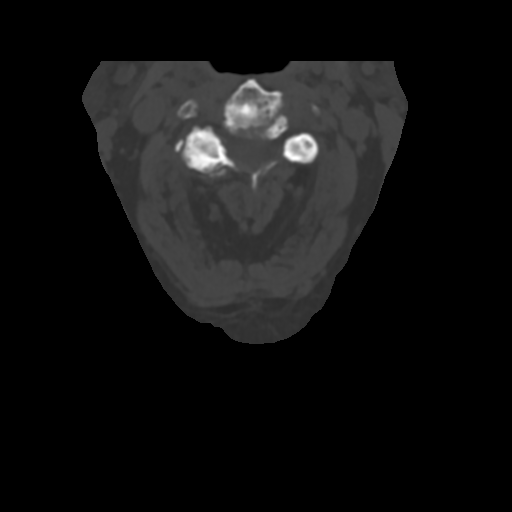
[im 90/107  bone]
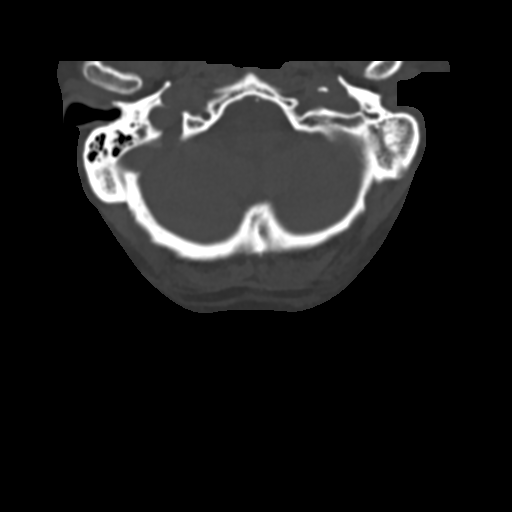

[Series 8: coronal bone · coronal · 0.31mm/px · 3 of 82 slices shown]
[im 25/82  bone]
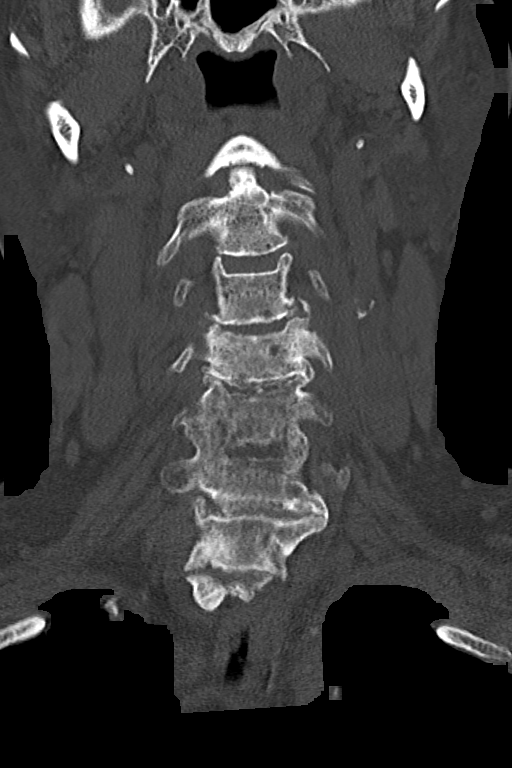
[im 36/82  bone]
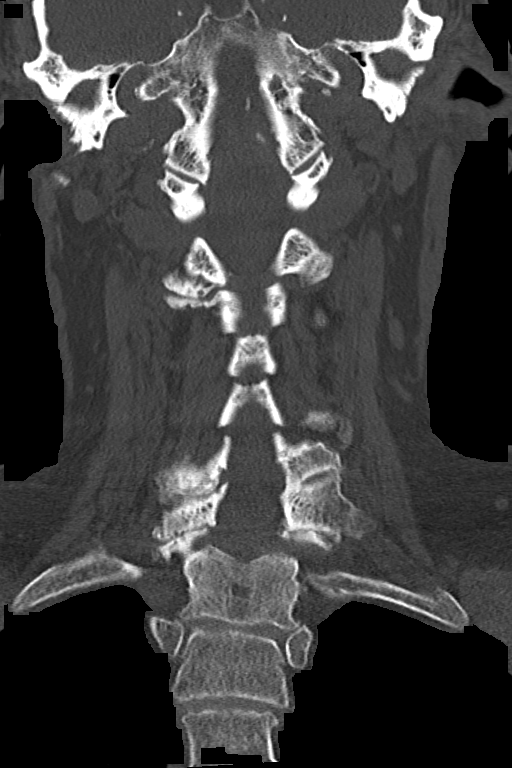
[im 46/82  bone]
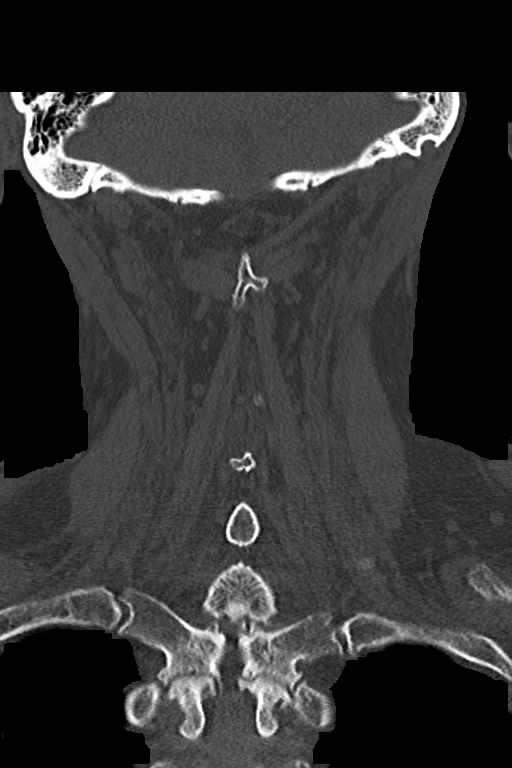

[Series 9: sagittal bone · sagittal · 0.31mm/px · 5 of 61 slices shown, 6 images]
[im 21/61  bone]
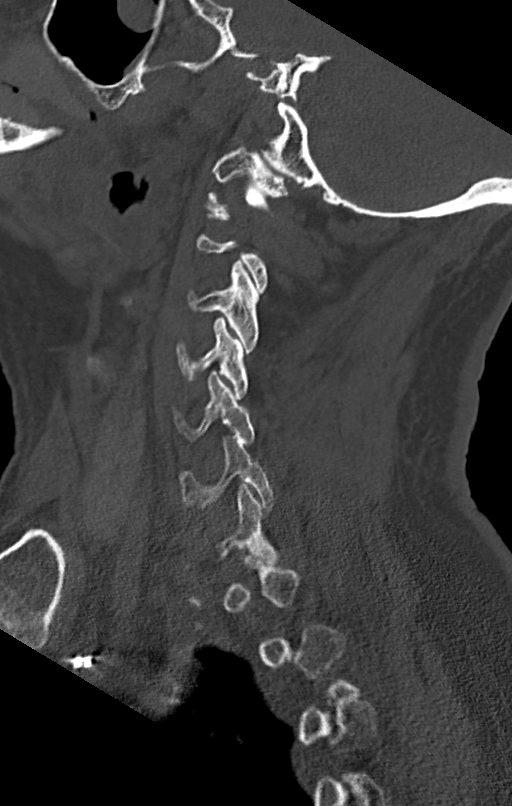
[im 26/61  bone]
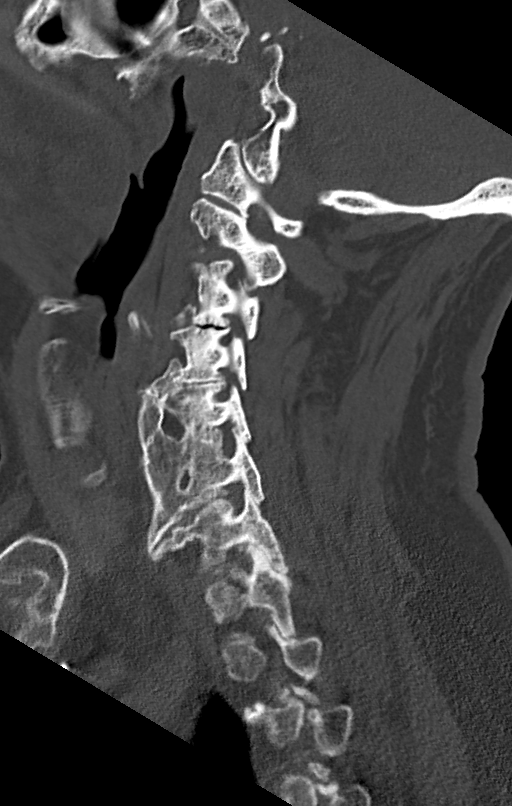
[im 31/61  soft-tissue]
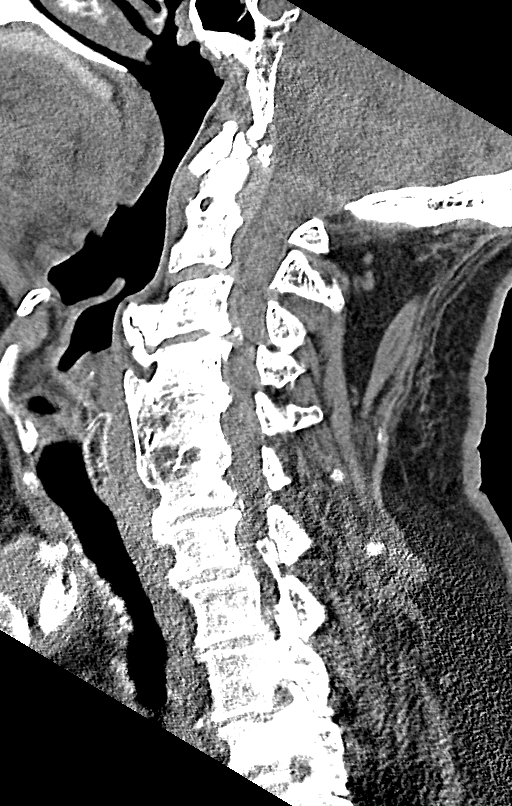
[im 31/61  bone]
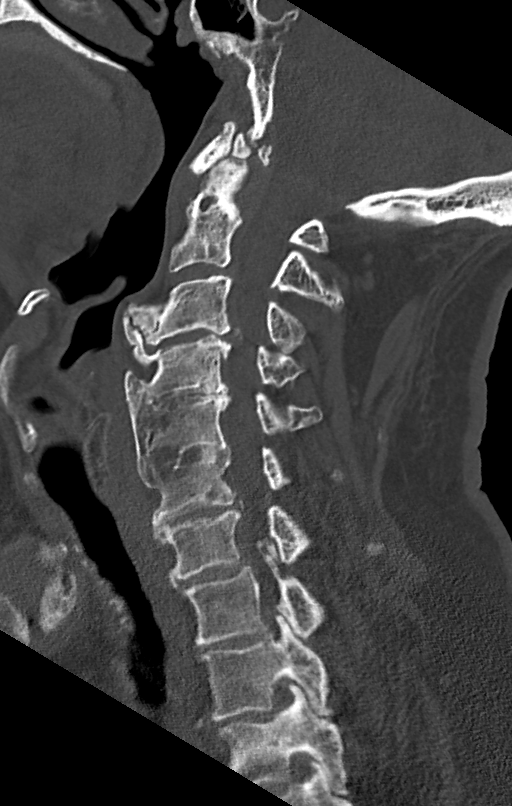
[im 36/61  bone]
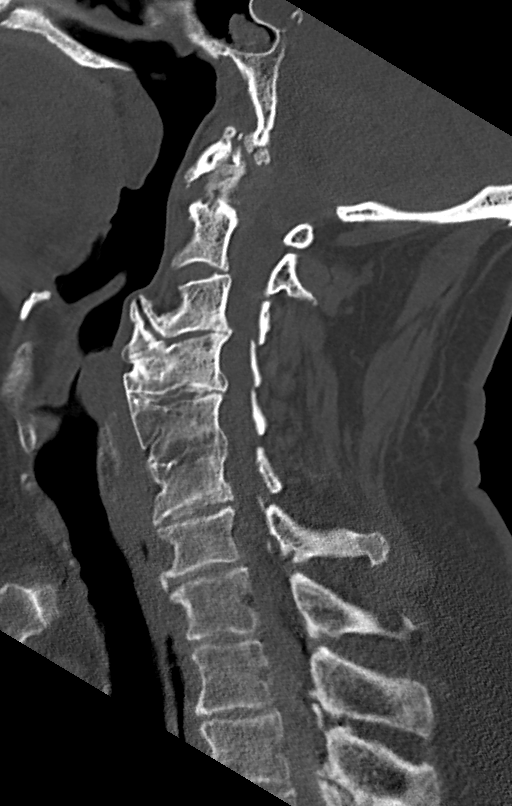
[im 41/61  bone]
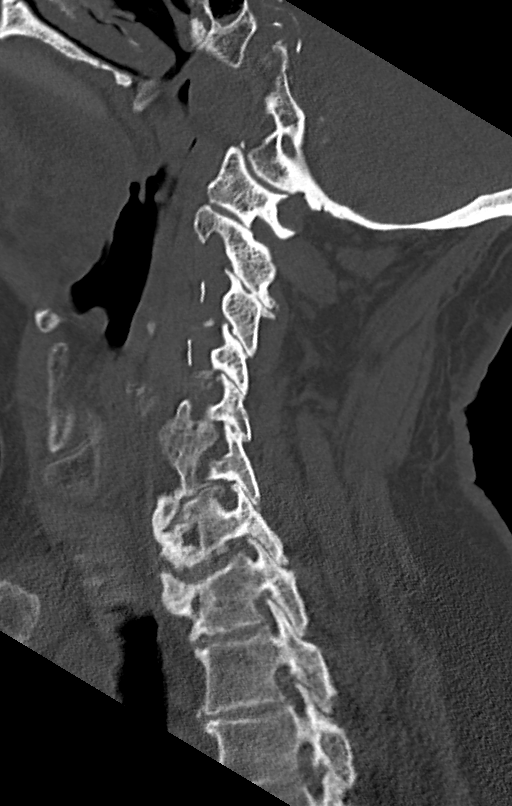

[12 of 33 positions shown; findings below may reference images not displayed]

FINDINGS: Alignment: Unremarkable

Skull base and vertebrae: No acute fracture. No primary bone lesion
or focal pathologic process.

Soft tissues and spinal canal: No visible canal collection. Apparent
epidural fat displacement at C7 on sagittal images is attributed to
artifact based on source images.

Disc levels:

C1-2: Atlantodental and facet spurring.  No visible impingement

C2-3: Uncovertebral and facet spurring with moderate left foraminal
stenosis

C3-4: Disc narrowing with partially calcified central protrusion.
Bulky ventral spurring. Mild bilateral facet spurring. Spinal
stenosis with implied cord impingement. Biforaminal impingement

C4-5: Disc collapse with endplate and uncovertebral ridging causing
biforaminal impingement. A disc osteophyte complex contacts the left
ventral cord. Intervertebral ankylosis.

C5-6: Disc collapse with endplate and uncovertebral ridging causing
moderate bilateral foraminal stenosis. Posterior ridging likely
contacts the ventral cord. Intervertebral ankylosis.

C6-7: Disc narrowing and bulging with extensive ridging and advanced
right foraminal impingement. Partially calcified central protrusion
likely impinges on the cord

C7-T1:Facet and spondylitic spurring with right more than left
foraminal impingement.

Upper chest: No acute finding
IMPRESSION: 1. Advanced, generalized cervical spine degeneration with implied
cord impingement at C3-4 and C6-7.
2. Multilevel foraminal impingement which is most advanced
bilaterally at C3-4, bilaterally at C4-5, and on the right at C6-7.
3. Degenerative ankylosis at C4-C6.

## 2021-10-24 MED ORDER — AMIODARONE HCL 200 MG PO TABS
100.0000 mg | ORAL_TABLET | ORAL | Status: DC
  Administered 2021-10-24 – 2021-11-01 (×12): 100 mg via ORAL
  Filled 2021-10-24 (×12): qty 1

## 2021-10-24 NOTE — Evaluation (Signed)
Physical Therapy Assessment and Plan ? ?Patient Details  ?Name: RAYNALDO FALCO ?MRN: [id number] ?Date of Birth: Sep 04, 1945 ? ?PT Diagnosis: Abnormal posture, Abnormality of gait, Cognitive deficits, Difficulty walking, Hemiparesis non-dominant, Hemiplegia non-dominant, Hypertonia, Impaired sensation, and Muscle weakness ?Rehab Potential: Good ?ELOS: 10-12 days  ? ?Today's Date: 10/24/2021 ?PT Individual Time: 1305-1410 ?PT Individual Time Calculation (min): 65 min   ? ?Hospital Problem: Principal Problem: ?  Stroke (cerebrum) ([place]) ? ? ?Past Medical History:  ?Past Medical History:  ?Diagnosis Date  ? Anxiety   ? Arthritis   ? Atrial fibrillation ([place])   ? a. Dx 2013, recurred 02/2014, CHA2DS2VASc = 3 -->placed on Eliquis;  b. 02/2014 Echo: EF 50-55%, mid and apical anterior septum and mid and apical inf septum are abnl, mild to mod Ao sclerosis w/o AS.  ? Cancer associated pain   ? Chicken pox   ? Chronic lymphocytic leukemia ([place])   ? a. Dx 02/2014.  ? CLL (chronic lymphocytic leukemia) ([place])   ? Complication of anesthesia   ? History of  PTSD--do not touch patient when waking up from surgery.  ? COPD (chronic obstructive pulmonary disease) ([place])   ? Coronary artery disease   ? a. 04/2009 CABG x 3 (LIMA->LAD, VG->OM1, VG->PDA);  b. 09/2009 Cath: occluded VG x 2 w/ patent LIMA and L->R collats. EF 55%, mild antlat HK;  c. 10/2011 MV: EF 53%, no isch/infarct-->low risk.  ? Dysrhythmia   ? hx of a-fib  ? GERD (gastroesophageal reflux disease)   ? occasional  ? History of chemotherapy 2015-2016  ? HOH (hard of hearing)   ? Bilateral Hearing Aids  ? Hypertension   ? Myocardial infarction [facility]) 2010  ? OSA on CPAP   ? USE C-PAP  ? Presence of permanent cardiac pacemaker 2017  ? PTSD (post-traumatic stress disorder)   ? PTSD (post-traumatic stress disorder)   ? Pure hypercholesterolemia   ? Rheumatic fever 1959  ? 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 TIA (transient ischemic attack) 11/02/2015  ? ?Past Surgical  History:  ?Past Surgical History:  ?Procedure Laterality Date  ? ABDOMINAL HERNIA REPAIR    ? APPENDECTOMY  06/21/1985  ? CARDIAC CATHETERIZATION  2010; 2011  ? ; Dr Fletcher Anon  ? CORONARY ARTERY BYPASS GRAFT  04/2009  ? "CABG X3"  ? EP IMPLANTABLE DEVICE N/A 03/03/2016  ? Procedure: Pacemaker Implant;  Surgeon: Deboraha Sprang, MD;  Location: [place] [facility];  Service: Cardiovascular;  Laterality: N/A;  ? [place]  ? "shrapnel in my tailbone"  ? INGUINAL HERNIA REPAIR Right   ? INSERT / REPLACE / REMOVE PACEMAKER    ? JOINT REPLACEMENT Right 2018  ? LAPAROSCOPIC CHOLECYSTECTOMY    ? RIGHT/LEFT HEART CATH AND CORONARY/GRAFT ANGIOGRAPHY N/A 02/04/2021  ? Procedure: RIGHT/LEFT HEART CATH AND CORONARY/GRAFT ANGIOGRAPHY;  Surgeon: Nelva Bush, MD;  Location: [place] [facility];  Service: Cardiovascular;  Laterality: N/A;  ? [place]  ? TOTAL HIP ARTHROPLASTY Right 10/22/2016  ? Procedure: TOTAL HIP ARTHROPLASTY;  Surgeon: Dereck Leep, MD;  Location: [facility] ORS;  Service: Orthopedics;  Laterality: Right;  ? TOTAL HIP ARTHROPLASTY Left 11/04/2017  ? Procedure: TOTAL HIP ARTHROPLASTY;  Surgeon: Dereck Leep, MD;  Location: [facility] ORS;  Service: Orthopedics;  Laterality: Left;  ? ? ?Assessment & Plan ?Clinical Impression: Patient is a 76 y.o. year old male who was receiving home health physical therapy due to COVID-19 infection  and debility.  On 10/17/2021, his physical therapist noticed worsening lethargy from baseline and lower extremity weakness with slurred speech. Refused to go to ED. Evaluated by Dr. Caryl Bis on 10/18/2021. Patient endorsed left-sided weakness although somewhat improved from the day prior.  Dr. Caryl Bis advised the patient and his wife of the possibility of stroke and advised EMS transportation to the emergency department.  They declined EMS and the patient's wife proceeded to take her husband to [facility] ED. NIH stroke scale on arrival 4 with left-sided  weakness and dysmetria.  The patient was outside tPA window since symptoms started greater than 24 hours prior to presentation.  EKG unremarkable.  Patient maintained on Eliquis at home secondary to atrial fibrillation.  He was administered 325 mg of aspirin.  CTA of head negative for stroke.  Unable to perform MRI due to the patient's indwelling pacemaker.  CTA of the head and neck performed on 3/31 without evidence of large vessel occlusion.  Admitted to hospitalist service.  Neurology, Dr. Leonel Ramsay, consulted.  Eliquis held per his recommendation and aspirin 81 mg continued.  Lovenox 47.5 mg every 24 hours initiated for VTE prophylaxis.  After evaluation by Dr. Leonel Ramsay on 4/1, he suspected small ischemic stroke.  There was no convincing evidence to suggest that switching from one anticoagulant to another in the setting of treatment failure would provide any benefit.  He recommended restarting Eliquis for 5 days from the onset of symptoms and was restarted on 4/4. Speech improved; left sided weakness persists. The patient requires inpatient medicine and rehabilitation evaluations and services for ongoing dysfunction secondary to ischemic stroke. Patient transferred to CIR on 10/23/2021 .  ? ?Patient currently requires min with mobility secondary to muscle weakness, decreased cardiorespiratoy endurance, abnormal tone, decreased memory, and decreased sitting balance, decreased standing balance, decreased postural control, hemiplegia, and decreased balance strategies.  Prior to hospitalization, patient was modified independent  with mobility and lived with Spouse in a House home.  Home access is  Ramped entrance. ? ?Patient will benefit from skilled PT intervention to maximize safe functional mobility, minimize fall risk, and decrease caregiver burden for planned discharge home with intermittent assist.  Anticipate patient will benefit from follow up OP at discharge. ? ?PT - End of Session ?Activity Tolerance:  Tolerates 30+ min activity with multiple rests ?Endurance Deficit: Yes ?Endurance Deficit Description: significant SOB with need for rest breaks with mobility, SPO2 >94% ?PT Assessment ?Rehab Potential (ACUTE/IP ONLY): Good ?PT Barriers to Discharge: Decreased caregiver support ?PT Patient demonstrates impairments in the following area(s): Balance;Perception;Behavior;Safety;Edema;Sensory;Skin Integrity;Endurance;Motor;Nutrition;Pain ?PT Transfers Functional Problem(s): Bed Mobility;Bed to Chair;Car;Furniture ?PT Locomotion Functional Problem(s): Ambulation;Wheelchair Mobility;Stairs ?PT Plan ?PT Intensity: Minimum of 1-2 x/day ,45 to 90 minutes ?PT Frequency: 5 out of 7 days ?PT Duration Estimated Length of Stay: 10-12 days ?PT Treatment/Interventions: Ambulation/gait training;Cognitive remediation/compensation;Discharge planning;DME/adaptive equipment instruction;Functional mobility training;Pain management;Splinting/orthotics;Psychosocial support;Therapeutic Activities;UE/LE Strength taining/ROM;Visual/perceptual remediation/compensation;Wheelchair propulsion/positioning;UE/LE Coordination activities;Therapeutic Exercise;Stair training;Skin care/wound management;Patient/family education;Neuromuscular re-education;Functional electrical stimulation;Disease management/prevention;Community reintegration;Balance/vestibular training ?PT Transfers Anticipated Outcome(s): mod I using LRAD ?PT Locomotion Anticipated Outcome(s): Supervision using LRAD >200 ft using LRAD ?PT Recommendation ?Recommendations for Other Services: Neuropsych consult;Therapeutic Recreation consult ?Therapeutic Recreation Interventions: Stress management ?Follow Up Recommendations: Outpatient PT ?Patient destination: Home ?Equipment Recommended: Rolling walker with 5" wheels ?Equipment Details: has electric scooter ? ? ?PT Evaluation ?Precautions/Restrictions ?Precautions ?Precautions: Fall ?Restrictions ?Weight Bearing Restrictions: No ?General ?   Vital SignsTherapy Vitals ?Temp: 98.2 ?F (36.8 ?C) ?Pulse Rate: 91 ?Resp: (!) 21 ?BP: (!) 135/52 ?Patient Position (if  appropriate): Sitting ?Oxygen Therapy ?SpO2: 97 % ?O2 Device: Room Air ?Pain ?  ?Pain

## 2021-10-24 NOTE — Progress Notes (Signed)
Inpatient Rehabilitation Care Coordinator ?Assessment and Plan ?Patient Details  ?Name: [name] ?MRN: [id number] ?Date of Birth: 08-Jan-1946 ? ?Today's Date: 10/24/2021 ? ?Hospital Problems: Principal Problem: ?  Stroke (cerebrum) ([place]) ? ?Past Medical History:  ?Past Medical History:  ?Diagnosis Date  ? Anxiety   ? Arthritis   ? Atrial fibrillation ([place])   ? a. Dx 2013, recurred 02/2014, CHA2DS2VASc = 3 -->placed on Eliquis;  b. 02/2014 Echo: EF 50-55%, mid and apical anterior septum and mid and apical inf septum are abnl, mild to mod Ao sclerosis w/o AS.  ? Cancer associated pain   ? Chicken pox   ? Chronic lymphocytic leukemia ([place])   ? a. Dx 02/2014.  ? CLL (chronic lymphocytic leukemia) ([place])   ? Complication of anesthesia   ? History of  PTSD--do not touch patient when waking up from surgery.  ? COPD (chronic obstructive pulmonary disease) ([place])   ? Coronary artery disease   ? a. 04/2009 CABG x 3 (LIMA->LAD, VG->OM1, VG->PDA);  b. 09/2009 Cath: occluded VG x 2 w/ patent LIMA and L->R collats. EF 55%, mild antlat HK;  c. 10/2011 MV: EF 53%, no isch/infarct-->low risk.  ? Dysrhythmia   ? hx of a-fib  ? GERD (gastroesophageal reflux disease)   ? occasional  ? History of chemotherapy 2015-2016  ? HOH (hard of hearing)   ? Bilateral Hearing Aids  ? Hypertension   ? Myocardial infarction [facility]) 2010  ? OSA on CPAP   ? USE C-PAP  ? Presence of permanent cardiac pacemaker 2017  ? PTSD (post-traumatic stress disorder)   ? PTSD (post-traumatic stress disorder)   ? Pure hypercholesterolemia   ? Rheumatic fever 1959  ? 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 TIA (transient ischemic attack) 11/02/2015  ? ?Past Surgical History:  ?Past Surgical History:  ?Procedure Laterality Date  ? ABDOMINAL HERNIA REPAIR    ? APPENDECTOMY  06/21/1985  ? CARDIAC CATHETERIZATION  2010; 2011  ? ; Dr Fletcher Anon  ? CORONARY ARTERY BYPASS GRAFT  04/2009  ? "CABG X3"  ? EP IMPLANTABLE DEVICE N/A 03/03/2016  ? Procedure: Pacemaker Implant;   Surgeon: Deboraha Sprang, MD;  Location: [place] [facility];  Service: Cardiovascular;  Laterality: N/A;  ? [place]  ? "shrapnel in my tailbone"  ? INGUINAL HERNIA REPAIR Right   ? INSERT / REPLACE / REMOVE PACEMAKER    ? JOINT REPLACEMENT Right 2018  ? LAPAROSCOPIC CHOLECYSTECTOMY    ? RIGHT/LEFT HEART CATH AND CORONARY/GRAFT ANGIOGRAPHY N/A 02/04/2021  ? Procedure: RIGHT/LEFT HEART CATH AND CORONARY/GRAFT ANGIOGRAPHY;  Surgeon: Nelva Bush, MD;  Location: [place] [facility];  Service: Cardiovascular;  Laterality: N/A;  ? [place]  ? TOTAL HIP ARTHROPLASTY Right 10/22/2016  ? Procedure: TOTAL HIP ARTHROPLASTY;  Surgeon: Dereck Leep, MD;  Location: [facility] ORS;  Service: Orthopedics;  Laterality: Right;  ? TOTAL HIP ARTHROPLASTY Left 11/04/2017  ? Procedure: TOTAL HIP ARTHROPLASTY;  Surgeon: Dereck Leep, MD;  Location: [facility] ORS;  Service: Orthopedics;  Laterality: Left;  ? ?Social History:  reports that he quit smoking about 15 years ago. His smoking use included cigarettes. He has a 40.00 pack-year smoking history. He has never used smokeless tobacco. He reports that he does not currently use alcohol. He reports that he does not use drugs. ? ?Family / Support Systems ?Marital Status: Married ?How Long?: 23 years ?Patient Roles: Spouse, Parent ?Spouse/Significant Other: [name] (wife) #  [phone] ?Children: Blended family. each of two children from previous marriages. Reports his son was up from [place] to help with yardwork. ?Other Supports: None reported ?Anticipated Caregiver: Wife ?Ability/Limitations of Caregiver: Pt wife reports she is able to provide 24/7 care however unable to do heavy lifting/tugging. Also reports a stroke in 2012 in which her residual effects are left hand is slighlty weaker and short term memory loss. ?Caregiver Availability: 24/7 ?Family Dynamics: Pt lives with her husband, and wife's 53 y.o. granddaughter lives in the home. ? 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 ?Education: some college ?Health Literacy - How often do you need to have someone help you when you read instructions, pamphlets, or other written material from your doctor or pharmacy?: Never ?Writes: Yes ?Employment Status: Retired ?Date Retired/Disabled/Unemployed: 2013 ?Age Retired: 39 ?Legal History/Current Legal Issues: Denies ?Guardian/Conservator: N/A  ? ?Abuse/Neglect ?Abuse/Neglect Assessment Can Be Completed: Yes ?Physical Abuse: Denies ?Verbal Abuse: Denies ?Sexual Abuse: Denies ?Exploitation of patient/patient's resources: Denies ?Self-Neglect: Denies ? ?Patient response to: ?Social Isolation - How often do you feel lonely or isolated from those around you?: Never ? ?Emotional Status ?Pt's affect, behavior and adjustment status: Pt in good spirits at time visit. Pt is adjusting as well as to be expected. ?Recent Psychosocial Issues: Denies ?Psychiatric History: Pt reports hx of PTSD. States he asks to not be touched because he is not sure how he will respond. Reports he has a Teacher, music through [place] [facility]) who prescribes lorazepam for sleep (PRN). ?Substance Abuse History: Denies ? ?Patient / Family Perceptions, Expectations & Goals ?Pt/Family understanding of illness & functional limitations: Pt and wife have a general understanding of pt care needs ?Premorbid pt/family roles/activities: Independent ?Anticipated changes in roles/activities/participation: Assistance with ADLs/IADLs ?Pt/family expectations/goals: Pt goal si work on using his left side. ? ?[employer] ?Community Agencies: None ?Premorbid Home Care/DME Agencies: None ?Transportation available at discharge: Wife ?Is the patient able to respond to transportation needs?: Yes ?In the past 12 months, has lack of transportation kept you from  medical appointments or from getting medications?: No ?In the past 12 months, has lack of transportation kept you from meetings, work, or from getting things needed for daily living?: No ?Resource referrals recommended: Neuropsychology ? ?Discharge Planning ?Living Arrangements: Spouse/significant other ?Support Systems: Spouse/significant other ?Type of Residence: Private residence ?Insurance Resources: Multimedia programmer (specify) Scientist, clinical (histocompatibility and immunogenetics) Medicare) ?Financial Resources: Social Security, Other (Comment) (pension/savings) ?Financial Screen Referred: No ?Living Expenses: Own ?Money Management: Patient, Spouse ?Does the patient have any problems obtaining your medications?: No ?Home Management: Pt reports his wife manages all homecare needs and he manages yardwork. ?Patient/Family Preliminary Plans: TBD ?Care Coordinator Barriers to Discharge: Decreased caregiver support, Lack of/limited family support ?Care Coordinator Anticipated Follow Up Needs: HH/OP ? ?Clinical Impression ?SW met with pt in room to introduce self, explain role, and discuss discharge process. Pt is calm, appears easy-going, and tells jokes often. 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 Uses VA benefits- [employer] [place]. HCPOA/POA- wife [name]. DME: cane (uses outside), RW (uses inside home), electric scooter (uses if he will be going out and would have to ambulate far), and ramp. Pt aware SW will follow-up with his wife.  ? ?1331-SW made contact with pt wife [name]. SW introduced self, explained role, and discussed discharge process. Wife is aware there will be updates after team conference on Tuesday.  ? ?Patience Nuzzo A  Barbra Sarks ?10/24/2021, 2:45 PM ? ?  ?

## 2021-10-24 NOTE — Progress Notes (Signed)
Pt taken to CT by transport

## 2021-10-24 NOTE — Progress Notes (Signed)
?                                                       PROGRESS NOTE ? ? ?Subjective/Complaints: ?Pt had a reasonable night. Generally "sore" but that's not too different from his baseline. Ready to get started with therapy so he can get home ? ?ROS: Patient denies fever, rash, sore throat, blurred vision, dizziness, nausea, vomiting, diarrhea, cough, shortness of breath or chest pain, joint or back/neck pain, headache, or mood change.  ? ? ?Objective: ?  ?CT CERVICAL SPINE WO CONTRAST ? ?Result Date: 10/24/2021 ?CLINICAL DATA:  Cervical radiculopathy. No red flag symptoms. Spasticity in the lower more than upper extremities EXAM: CT CERVICAL SPINE WITHOUT CONTRAST TECHNIQUE: Multidetector CT imaging of the cervical spine was performed without intravenous contrast. Multiplanar CT image reconstructions were also generated. RADIATION DOSE REDUCTION: This exam was performed according to the departmental dose-optimization program which includes automated exposure control, adjustment of the mA and/or kV according to patient size and/or use of iterative reconstruction technique. COMPARISON:  CTA of the neck 11/02/2015 FINDINGS: Alignment: Unremarkable Skull base and vertebrae: No acute fracture. No primary bone lesion or focal pathologic process. Soft tissues and spinal canal: No visible canal collection. Apparent epidural fat displacement at C7 on sagittal images is attributed to artifact based on source images. Disc levels: C1-2: Atlantodental and facet spurring.  No visible impingement C2-3: Uncovertebral and facet spurring with moderate left foraminal stenosis C3-4: Disc narrowing with partially calcified central protrusion. Bulky ventral spurring. Mild bilateral facet spurring. Spinal stenosis with implied cord impingement. Biforaminal impingement C4-5: Disc collapse with endplate and uncovertebral ridging causing biforaminal impingement. A disc osteophyte complex contacts the left ventral cord. Intervertebral  ankylosis. C5-6: Disc collapse with endplate and uncovertebral ridging causing moderate bilateral foraminal stenosis. Posterior ridging likely contacts the ventral cord. Intervertebral ankylosis. C6-7: Disc narrowing and bulging with extensive ridging and advanced right foraminal impingement. Partially calcified central protrusion likely impinges on the cord C7-T1:Facet and spondylitic spurring with right more than left foraminal impingement. Upper chest: No acute finding IMPRESSION: 1. 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 2. Multilevel foraminal impingement which is most advanced bilaterally at C3-4, bilaterally at C4-5, and on the right at C6-7. 3. 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 Electronically Signed   By: Jorje Guild M.D.   On: 10/24/2021 06:28   ?Recent Labs  ?  10/24/21 ?0512  ?WBC 7.7  ?HGB 9.8*  ?HCT 30.1*  ?PLT 207  ? ?Recent Labs  ?  10/24/21 ?0512  ?NA 138  ?K 3.7  ?CL 104  ?CO2 26  ?GLUCOSE 129*  ?BUN 11  ?CREATININE 0.81  ?CALCIUM 8.3*  ? ? ?Intake/Output Summary (Last 24 hours) at 10/24/2021 1039 ?Last data filed at 10/24/2021 9675 ?Gross per 24 hour  ?Intake 720 ml  ?Output 1050 ml  ?Net -330 ml  ?  ? ?  ? ?Physical Exam: ?Vital Signs ?Blood pressure 122/69, pulse 71, temperature 97.6 ?F (36.4 ?C), resp. rate 18, height '5\' 9"'$  (1.753 m), weight 97 kg, SpO2 97 %. ? ?General: Alert and oriented x 3, No apparent distress ?HEENT: Head is normocephalic, atraumatic, PERRLA, EOMI, sclera anicteric, oral mucosa pink and moist, dentition intact, ext ear canals clear,  ?Neck: Supple without JVD or lymphadenopathy ?Heart: Reg  rate and rhythm. No murmurs rubs or gallops ?Chest: CTA bilaterally without wheezes, rales, or rhonchi; no distress ?Abdomen: Soft, non-tender, non-distended, bowel sounds positive. ?Extremities: No clubbing, cyanosis, or edema. Pulses are 2+ ?Psych: Pt's affect is appropriate. Pt is cooperative ?Skin: Clean and intact without  signs of breakdown ?Neuro:  LUE 3/5. LLE 3/5 prox to 4/5 distally--inconsistent effort. Sensory 1/2 LUE and LLE. DTR's 3+ in LE's and at least 2+ UE's.  Clonus in both ankles ?Musculoskeletal: generalized joint tenderness with palpation.   ? ? ?Assessment/Plan: ?1. Functional deficits which require 3+ hours per day of interdisciplinary therapy in a comprehensive inpatient rehab setting. ?Physiatrist is providing close team supervision and 24 hour management of active medical problems listed below. ?Physiatrist and rehab team continue to assess barriers to discharge/monitor patient progress toward functional and medical goals ? ?Care Tool: ? ?Bathing ?   ?   ?   ?  ?  ?Bathing assist   ?  ?  ?Upper Body Dressing/Undressing ?Upper body dressing   ?  ?   ?Upper body assist   ?   ?Lower Body Dressing/Undressing ?Lower body dressing ? ? ?   ?  ? ?  ? ?Lower body assist   ?   ? ?Toileting ?Toileting    ?Toileting assist   ?  ?  ?Transfers ?Chair/bed transfer ? ?Transfers assist ? Chair/bed transfer activity did not occur: Safety/medical concerns ? ?  ?  ?  ?Locomotion ?Ambulation ? ? ?Ambulation assist ? ?   ? ?  ?  ?   ? ?Walk 10 feet activity ? ? ?Assist ?   ? ?  ?   ? ?Walk 50 feet activity ? ? ?Assist   ? ?  ?   ? ? ?Walk 150 feet activity ? ? ?Assist   ? ?  ?  ?  ? ?Walk 10 feet on uneven surface  ?activity ? ? ?Assist   ? ? ?  ?   ? ?Wheelchair ? ? ? ? ?Assist   ?  ?  ? ?  ?   ? ? ?Wheelchair 50 feet with 2 turns activity ? ? ? ?Assist ? ?  ?  ? ? ?   ? ?Wheelchair 150 feet activity  ? ? ? ?Assist ?   ? ? ?   ? ?Blood pressure 122/69, pulse 71, temperature 97.6 ?F (36.4 ?C), resp. rate 18, height '5\' 9"'$  (1.753 m), weight 97 kg, SpO2 97 %. ? ?Medical Problem List and Plan: ?1. Functional deficits secondary to right sided ischemic stroke and debility from recent Covid 19 infection ?            -patient may  shower ?            -ELOS/Goals: 7-10d Mod I ?2.  Antithrombotics: ?-DVT/anticoagulation:  Pharmaceutical: Other  (comment)Eliquis ?            -antiplatelet therapy: none ?3. Pain Management: Tylenol, Tramadol prn ?  ?4. Mood: LCSW to evaluate and provide emotional support ?            -antipsychotic agents: n/a ?5. Neuropsych: This patient is capable of making decisions on his own behalf. ?6. Skin/Wound Care: Routine skin care checks ?7. Fluids/Electrolytes/Nutrition: encourage PO ? I personally reviewed the patient's labs today.   ?8: Hypertension: continue Imdur ? 4/6 bp controlled ?9: Chronic diastolic HF: euvolemic; continue to monitor ?            --daily weights ?            --  continue Lasix 40 mg daily and monitor K+ ?10: Hyperlipidemia: continue Zetia (on Praluent at home) ?11: CAD: s/p CABG 2010; left heart cath 01/2021 ?--CP yesterday, 4/4: EKG and troponin negative ?            --NTG as needed ?12: Atrial fibrillation: continue Eliquis, Pacerone ?13: COPD: Continue Dulera ?            --albuterol nebs as needed ? -encouraged IS, OOB ?14: GERD: ?15: OSA: CPAP ?  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            --follow-up with Dr. 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  Pt with bilateral sustained clonus at the ankles 3+ DTR throughout ? 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  ? -I suspect some of his presentation, particularly biltateral increased LE reflexes and clonus might be related to cervical myelopathy ? -consider NS assessment ?  ? ?LOS: ?1 days ?A FACE TO FACE EVALUATION WAS PERFORMED ? ?Meredith Staggers ?10/24/2021, 10:39 AM  ? ?  ?

## 2021-10-24 NOTE — Consult Note (Signed)
Reason for Consult: Cervical stenosis ?Referring Physician: Dr. Tessa Lerner ? ?[name] is an 76 y.o. male.  ?HPI: The patient is a 76 year old white male with COPD, coronary artery disease, atrial fibrillation on Eliquis, CLL,... Who presented with left-sided weakness.  He was worked up with a head CT which did not demonstrate any acute findings.  He was not further worked up because he has a pacemaker.  It is unknown at this point whether his pacemaker is MRI compatible.  He was sent to rehabilitation.  Dr. Eda Keys has noted hyperreflexia and some weakness and he was worked up with a cervical CT which demonstrated spondylosis and stenosis.  A neurosurgical consultation was requested. ? ?Presently the patient is alert and pleasant.  When questioned he admits to some mild intermittent neck pain but nothing that is particularly concerning to him.  He has had an unsteady gait for about a year.  He is taken about 4 falls.  He complains of pain in his left arm, thorax and into his left leg.  He admits to some weakness but is difficult to pin him down on this.  He denies cranial nerve symptoms. ? ?Past Medical History:  ?Diagnosis Date  ? Anxiety   ? Arthritis   ? Atrial fibrillation ([place])   ? a. Dx 2013, recurred 02/2014, CHA2DS2VASc = 3 -->placed on Eliquis;  b. 02/2014 Echo: EF 50-55%, mid and apical anterior septum and mid and apical inf septum are abnl, mild to mod Ao sclerosis w/o AS.  ? Cancer associated pain   ? Chicken pox   ? Chronic lymphocytic leukemia ([place])   ? a. Dx 02/2014.  ? CLL (chronic lymphocytic leukemia) ([place])   ? Complication of anesthesia   ? History of  PTSD--do not touch patient when waking up from surgery.  ? COPD (chronic obstructive pulmonary disease) ([place])   ? Coronary artery disease   ? a. 04/2009 CABG x 3 (LIMA->LAD, VG->OM1, VG->PDA);  b. 09/2009 Cath: occluded VG x 2 w/ patent LIMA and L->R collats. EF 55%, mild antlat HK;  c. 10/2011 MV: EF 53%, no isch/infarct-->low risk.  ? Dysrhythmia    ? hx of a-fib  ? GERD (gastroesophageal reflux disease)   ? occasional  ? History of chemotherapy 2015-2016  ? HOH (hard of hearing)   ? Bilateral Hearing Aids  ? Hypertension   ? Myocardial infarction [facility]) 2010  ? OSA on CPAP   ? USE C-PAP  ? Presence of permanent cardiac pacemaker 2017  ? PTSD (post-traumatic stress disorder)   ? PTSD (post-traumatic stress disorder)   ? Pure hypercholesterolemia   ? Rheumatic fever 1959  ? 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 TIA (transient ischemic attack) 11/02/2015  ? ? ?Past Surgical History:  ?Procedure Laterality Date  ? ABDOMINAL HERNIA REPAIR    ? APPENDECTOMY  06/21/1985  ? CARDIAC CATHETERIZATION  2010; 2011  ? ; Dr Fletcher Anon  ? CORONARY ARTERY BYPASS GRAFT  04/2009  ? "CABG X3"  ? EP IMPLANTABLE DEVICE N/A 03/03/2016  ? Procedure: Pacemaker Implant;  Surgeon: Deboraha Sprang, MD;  Location: [facility];  Service: Cardiovascular;  Laterality: N/A;  ? [place]  ? "shrapnel in my tailbone"  ? INGUINAL HERNIA REPAIR Right   ? INSERT / REPLACE / REMOVE PACEMAKER    ? JOINT REPLACEMENT Right 2018  ? LAPAROSCOPIC CHOLECYSTECTOMY    ? RIGHT/LEFT HEART CATH AND CORONARY/GRAFT ANGIOGRAPHY N/A 02/04/2021  ?  Procedure: RIGHT/LEFT HEART CATH AND CORONARY/GRAFT ANGIOGRAPHY;  Surgeon: Nelva Bush, MD;  Location: [facility];  Service: Cardiovascular;  Laterality: N/A;  ? [place]  ? TOTAL HIP ARTHROPLASTY Right 10/22/2016  ? Procedure: TOTAL HIP ARTHROPLASTY;  Surgeon: Dereck Leep, MD;  Location: [facility];  Service: Orthopedics;  Laterality: Right;  ? TOTAL HIP ARTHROPLASTY Left 11/04/2017  ? Procedure: TOTAL HIP ARTHROPLASTY;  Surgeon: Dereck Leep, MD;  Location: [facility];  Service: Orthopedics;  Laterality: Left;  ? ? ?Family History  ?Problem Relation Age of Onset  ? Heart disease Mother   ? Heart attack Mother   ? Coronary artery disease Other   ?     family history  ? ? ?Social History:  reports  that he quit smoking about 15 years ago. His smoking use included cigarettes. He has a 40.00 pack-year smoking history. He has never used smokeless tobacco. He reports that he does not currently use alcohol. He reports that he does not use drugs. ? ?Allergies: No Known Allergies ? ?Medications: I have reviewed the patient's current medications. ?Prior to Admission:  ?Medications Prior to Admission  ?Medication Sig Dispense Refill Last Dose  ? acetaminophen (TYLENOL) 500 MG tablet Take 1,000 mg by mouth every 8 (eight) hours as needed for mild pain.     ? acyclovir (ZOVIRAX) 400 MG tablet TAKE 1 TABLET(400 MG) BY MOUTH TWICE DAILY 60 tablet 6   ? albuterol (PROVENTIL HFA;VENTOLIN HFA) 108 (90 Base) MCG/ACT inhaler Inhale 2 puffs into the lungs every 6 (six) hours as needed for wheezing or shortness of breath. 1 Inhaler 11   ? Alirocumab (PRALUENT) 150 MG/ML SOAJ Inject 150 mg into the skin every 14 (fourteen) days. 2 mL 12   ? amiodarone (PACERONE) 200 MG tablet Take 1/2 tablet (100 mg) by mouth twice daily 5 days a week 60 tablet 6   ? apixaban (ELIQUIS) 5 MG TABS tablet Take 5 mg by mouth 2 (two) times daily.     ? azelastine (ASTELIN) 0.1 % nasal spray Place 2 sprays into both nostrils 2 (two) times daily. Use in each nostril as directed 30 mL 3   ? budesonide-formoterol (SYMBICORT) 160-4.5 MCG/ACT inhaler Inhale 2 puffs into the lungs 2 (two) times daily. 1 Inhaler 11   ? cetirizine (ZYRTEC) 10 MG tablet Take 10 mg by mouth daily as needed for allergies.      ? Coenzyme Q10 (COQ10) 200 MG CAPS Take 200 mg by mouth daily.     ? ezetimibe (ZETIA) 10 MG tablet Take 1 tablet (10 mg total) by mouth daily. 90 tablet 3   ? furosemide (LASIX) 20 MG tablet Take 2 tablets (40 mg total) by mouth daily. Do not take on Thursdays 180 tablet 3   ? guaiFENesin-dextromethorphan (ROBITUSSIN DM) 100-10 MG/5ML syrup Take 5 mLs by mouth every 4 (four) hours as needed for cough. (Patient not taking: Reported on 10/18/2021) 118 mL 0   ?  isosorbide mononitrate (IMDUR) 30 MG 24 hr tablet Take 30 mg by mouth daily.     ? Krill Oil 350 MG CAPS Take 350 mg by mouth daily as needed (Heart).     ? mirtazapine (REMERON) 15 MG tablet Take 15 mg by mouth at bedtime as needed (for panic associated with PTSD).      ? Multiple Vitamin (MULTIVITAMIN WITH MINERALS) TABS tablet Take 1 tablet by mouth daily.     ? nitroGLYCERIN (NITROSTAT) 0.4 MG SL  tablet Place 1 tablet (0.4 mg total) under the tongue every 5 (five) minutes as needed for chest pain. 25 tablet 6   ? Tiotropium Bromide Monohydrate 2.5 MCG/ACT AERS Inhale 2 puffs into the lungs at bedtime. Spiriva     ? traMADol (ULTRAM) 50 MG tablet Take 1 tablet (50 mg total) by mouth every 12 (twelve) hours as needed. 60 tablet 0   ? ?Scheduled: ? acyclovir  400 mg Oral BID  ? albuterol  2 puff Inhalation QPM  ? amiodarone  100 mg Oral 2 times per day on Mon Tue Thu Fri Sat  ? apixaban  5 mg Oral BID  ? ezetimibe  10 mg Oral Daily  ? feeding supplement  1 Container Oral TID BM  ? furosemide  40 mg Oral Daily  ? isosorbide mononitrate  30 mg Oral Daily  ? mirtazapine  15 mg Oral QHS  ? mometasone-formoterol  2 puff Inhalation BID  ? ?Continuous: ?HGD:[id number], albuterol **AND** albuterol, alum & mag hydroxide-simeth, diphenhydrAMINE, guaiFENesin-dextromethorphan, methocarbamol, nitroGLYCERIN, ondansetron, senna-docusate, sodium phosphate, sorbitol, traMADol ?Anti-infectives (From admission, onward)  ? ? Start     Dose/Rate Route Frequency Ordered Stop  ? 10/23/21 2000  acyclovir (ZOVIRAX) 200 MG capsule 400 mg       ?Note to Pharmacy: TAKE 1 TABLET(400 MG) BY MOUTH TWICE DAILY    ? 400 mg Oral 2 times daily 10/23/21 1342    ? ?  ? ? ? ?Results for orders placed or performed during the hospital encounter of 10/23/21 (from the past 48 hour(s))  ?Comprehensive metabolic panel     Status: Abnormal  ? Collection Time: 10/24/21  5:12 AM  ?Result Value Ref Range  ? Sodium 138 135 - 145 mmol/L  ? Potassium 3.7 3.5 -  5.1 mmol/L  ? Chloride 104 98 - 111 mmol/L  ? CO2 26 22 - 32 mmol/L  ? Glucose, Bld 129 (H) 70 - 99 mg/dL  ?  Comment: Glucose reference range applies only to samples taken after fasting for at least 8

## 2021-10-24 NOTE — Progress Notes (Signed)
Inpatient Rehabilitation  Patient information reviewed and entered into eRehab system by Karel Mowers Marion Rosenberry, OTR/L.   Information including medical coding, functional ability and quality indicators will be reviewed and updated through discharge.    

## 2021-10-24 NOTE — Evaluation (Signed)
Speech Language Pathology Assessment and Plan ? ?Patient Details  ?Name: [name] ?MRN: [id number] ?Date of Birth: 1945-12-25 ? ?SLP Diagnosis: Cognitive Impairments  ?Rehab Potential: Excellent ?ELOS: 10-12 days  ? ? ?Today's Date: 10/24/2021 ?SLP Individual Time: 6295-2841 ?SLP Individual Time Calculation (min): 60 min ? ? ?Hospital Problem: Principal Problem: ?  Stroke (cerebrum) ([place]) ? ?Past Medical History:  ?Past Medical History:  ?Diagnosis Date  ? Anxiety   ? Arthritis   ? Atrial fibrillation ([place])   ? a. Dx 2013, recurred 02/2014, CHA2DS2VASc = 3 -->placed on Eliquis;  b. 02/2014 Echo: EF 50-55%, mid and apical anterior septum and mid and apical inf septum are abnl, mild to mod Ao sclerosis w/o AS.  ? Cancer associated pain   ? Chicken pox   ? Chronic lymphocytic leukemia ([place])   ? a. Dx 02/2014.  ? CLL (chronic lymphocytic leukemia) ([place])   ? Complication of anesthesia   ? History of  PTSD--do not touch patient when waking up from surgery.  ? COPD (chronic obstructive pulmonary disease) ([place])   ? Coronary artery disease   ? a. 04/2009 CABG x 3 (LIMA->LAD, VG->OM1, VG->PDA);  b. 09/2009 Cath: occluded VG x 2 w/ patent LIMA and L->R collats. EF 55%, mild antlat HK;  c. 10/2011 MV: EF 53%, no isch/infarct-->low risk.  ? Dysrhythmia   ? hx of a-fib  ? GERD (gastroesophageal reflux disease)   ? occasional  ? History of chemotherapy 2015-2016  ? HOH (hard of hearing)   ? Bilateral Hearing Aids  ? Hypertension   ? Myocardial infarction [facility]) 2010  ? OSA on CPAP   ? USE C-PAP  ? Presence of permanent cardiac pacemaker 2017  ? PTSD (post-traumatic stress disorder)   ? PTSD (post-traumatic stress disorder)   ? Pure hypercholesterolemia   ? Rheumatic fever 1959  ? 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 TIA (transient ischemic attack) 11/02/2015  ? ?Past Surgical History:  ?Past Surgical History:  ?Procedure Laterality Date  ? ABDOMINAL HERNIA REPAIR    ? APPENDECTOMY  06/21/1985  ? CARDIAC  CATHETERIZATION  2010; 2011  ? ; Dr Fletcher Anon  ? CORONARY ARTERY BYPASS GRAFT  04/2009  ? "CABG X3"  ? EP IMPLANTABLE DEVICE N/A 03/03/2016  ? Procedure: Pacemaker Implant;  Surgeon: Deboraha Sprang, MD;  Location: [facility];  Service: Cardiovascular;  Laterality: N/A;  ? [place]  ? "shrapnel in my tailbone"  ? INGUINAL HERNIA REPAIR Right   ? INSERT / REPLACE / REMOVE PACEMAKER    ? JOINT REPLACEMENT Right 2018  ? LAPAROSCOPIC CHOLECYSTECTOMY    ? RIGHT/LEFT HEART CATH AND CORONARY/GRAFT ANGIOGRAPHY N/A 02/04/2021  ? Procedure: RIGHT/LEFT HEART CATH AND CORONARY/GRAFT ANGIOGRAPHY;  Surgeon: Nelva Bush, MD;  Location: [facility];  Service: Cardiovascular;  Laterality: N/A;  ? [place]  ? TOTAL HIP ARTHROPLASTY Right 10/22/2016  ? Procedure: TOTAL HIP ARTHROPLASTY;  Surgeon: Dereck Leep, MD;  Location: [facility];  Service: Orthopedics;  Laterality: Right;  ? TOTAL HIP ARTHROPLASTY Left 11/04/2017  ? Procedure: TOTAL HIP ARTHROPLASTY;  Surgeon: Dereck Leep, MD;  Location: [facility];  Service: Orthopedics;  Laterality: Left;  ? ? ?Assessment / Plan / Recommendation ?Clinical Impression Patient is a 76 year old male who was receiving home health physical therapy due to COVID-19 infection and debility.  On 10/17/2021, his physical therapist noticed worsening lethargy from baseline and lower extremity weakness  with slurred speech. Refused to go to ED. Evaluated by Dr. Caryl Bis on 10/18/2021. Patient endorsed left-sided weakness although somewhat improved from the day prior.  Dr. Caryl Bis advised the patient and his wife of the possibility of stroke and advised EMS transportation to the emergency department.  They declined EMS and the patient's wife proceeded to take her husband to [facility] ED. NIH stroke scale on arrival 4 with left-sided weakness and dysmetria.  The patient was outside tPA window since symptoms started greater than 24 hours prior to  presentation.  EKG unremarkable.   CTA of head negative for stroke.  Unable to perform MRI due to the patient's indwelling pacemaker.  CTA of the head and neck performed on 3/31 without evidence of large vessel occlusion.  Admitted to hospitalist service.  Neurology, Dr. Leonel Ramsay, consulted.   After evaluation by Dr. Leonel Ramsay on 4/1, he suspected small ischemic stroke. Speech improved; left sided weakness persists. The patient requires inpatient medicine and rehabilitation evaluations and services for ongoing dysfunction secondary to ischemic stroke. Patient admitted 10/23/21. ? ?SLP administered both formal and informal assessments of patient's overall cognitive functioning. SLP administered the Cognistat and which patient scored WFL on all subtests with the exception of mild impairments in registration and calculations and moderate impairments in short-term recall. During informal assessment, patient verbose (may be baseline) and required intermittent redirection to tasks. Patient also observed with decreased working memory during functional tasks like ordering meals with the food ambassador. Patient requested to use the bathroom and required intermittent verbal cues for safety with task, suspect due to urgency. Patient transferred back to bed at end of session and was shivering in bed. Multiple blankets applied. Patient would benefit from skilled SLP intervention to maximize his cognitive functioning and overall functional independence prior to discharge.  ? ?  ?Skilled Therapeutic Interventions          Administered a cognitive-linguistic evaluation, please see above for details.    ?SLP Assessment ? Patient will need skilled Speech Lanaguage Pathology Services during CIR admission  ?  ?Recommendations ? Oral Care Recommendations: Oral care BID ?Recommendations for Other Services: Neuropsych consult ?Patient destination: Home ?Follow up Recommendations:  (TBD) ?Equipment Recommended: None recommended by SLP   ?  ?SLP Frequency 1 to 3 out of 7 days   ?SLP Duration ? ?SLP Intensity ? ?SLP Treatment/Interventions 10-12 days ? ?Minumum of 1-2 x/day, 30 to 90 minutes ? ?Cognitive remediation/compensation;Internal/external aids;Cueing hierarchy;Environmental controls;Functional tasks;Patient/family education;Therapeutic Activities   ? ?Pain ?No/Denies Pain  ? ?Prior Functioning ?Type of Home: House ? Lives With: Spouse ?Available Help at Discharge: Family;Available 24 hours/day ? ?SLP Evaluation ?Cognition ?Overall Cognitive Status: Impaired/Different from baseline ?Arousal/Alertness: Awake/alert ?Orientation Level: Oriented X4 ?Year: 2023 ?Month: April ?Day of Week: Correct ?Attention: Divided ?Selective Attention: Appears intact ?Divided Attention: Impaired ?Divided Attention Impairment: Functional complex;Verbal complex ?Memory: Impaired ?Memory Impairment: Decreased recall of new information;Decreased short term memory;Retrieval deficit ?Decreased Short Term Memory: Verbal complex;Functional complex ?Awareness: Appears intact ?Problem Solving: Impaired ?Problem Solving Impairment: Functional complex ?Behaviors:  (verbose) ?Safety/Judgment: Appears intact  ?Comprehension ?Auditory Comprehension ?Overall Auditory Comprehension: Appears within functional limits for tasks assessed ?Visual Recognition/Discrimination ?Discrimination: Within Function Limits ?Reading Comprehension ?Reading Status: Not tested ?Expression ?Expression ?Primary Mode of Expression: Verbal ?Verbal Expression ?Overall Verbal Expression: Appears within functional limits for tasks assessed ?Written Expression ?Dominant Hand: Right ?Written Expression: Not tested ?Oral Motor ?Oral Motor/Sensory Function ?Overall Oral Motor/Sensory Function: Within functional limits ?Motor Speech ?Overall Motor Speech: Appears within functional limits for  tasks assessed ? ?Care Tool ?Care Tool Cognition ?Ability to hear (with hearing aid or hearing appliances if normally  used Ability to hear (with hearing aid or hearing appliances if normally used): 0. Adequate - no difficulty in normal conservation, social interaction, listening to TV ?  ?Expression of Ideas and Wants Expression of Ideas an

## 2021-10-24 NOTE — Evaluation (Signed)
Occupational Therapy Assessment and Plan ? ?Patient Details  ?Name: [name] ?MRN: [id number] ?Date of Birth: August 29, 1945 ? ?OT Diagnosis: abnormal posture, ataxia, cognitive deficits, and muscle weakness (generalized) ?Rehab Potential: Rehab Potential (ACUTE ONLY): Good ?ELOS: 9-12  ? ?Today's Date: 10/24/2021 ?OT Individual Time: 1000-1100 ?OT Individual Time Calculation (min): 60 min    ? ?Hospital Problem: Principal Problem: ?  Stroke (cerebrum) ([place]) ? ? ?Past Medical History:  ?Past Medical History:  ?Diagnosis Date  ? Anxiety   ? Arthritis   ? Atrial fibrillation ([place])   ? a. Dx 2013, recurred 02/2014, CHA2DS2VASc = 3 -->placed on Eliquis;  b. 02/2014 Echo: EF 50-55%, mid and apical anterior septum and mid and apical inf septum are abnl, mild to mod Ao sclerosis w/o AS.  ? Cancer associated pain   ? Chicken pox   ? Chronic lymphocytic leukemia ([place])   ? a. Dx 02/2014.  ? CLL (chronic lymphocytic leukemia) ([place])   ? Complication of anesthesia   ? History of  PTSD--do not touch patient when waking up from surgery.  ? COPD (chronic obstructive pulmonary disease) ([place])   ? Coronary artery disease   ? a. 04/2009 CABG x 3 (LIMA->LAD, VG->OM1, VG->PDA);  b. 09/2009 Cath: occluded VG x 2 w/ patent LIMA and L->R collats. EF 55%, mild antlat HK;  c. 10/2011 MV: EF 53%, no isch/infarct-->low risk.  ? Dysrhythmia   ? hx of a-fib  ? GERD (gastroesophageal reflux disease)   ? occasional  ? History of chemotherapy 2015-2016  ? HOH (hard of hearing)   ? Bilateral Hearing Aids  ? Hypertension   ? Myocardial infarction [facility]) 2010  ? OSA on CPAP   ? USE C-PAP  ? Presence of permanent cardiac pacemaker 2017  ? PTSD (post-traumatic stress disorder)   ? PTSD (post-traumatic stress disorder)   ? Pure hypercholesterolemia   ? Rheumatic fever 1959  ? 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 TIA (transient ischemic attack) 11/02/2015  ? ?Past Surgical History:  ?Past Surgical History:  ?Procedure Laterality Date  ? ABDOMINAL  HERNIA REPAIR    ? APPENDECTOMY  06/21/1985  ? CARDIAC CATHETERIZATION  2010; 2011  ? ; Dr Fletcher Anon  ? CORONARY ARTERY BYPASS GRAFT  04/2009  ? "CABG X3"  ? EP IMPLANTABLE DEVICE N/A 03/03/2016  ? Procedure: Pacemaker Implant;  Surgeon: Deboraha Sprang, MD;  Location: [facility];  Service: Cardiovascular;  Laterality: N/A;  ? [place]  ? "shrapnel in my tailbone"  ? INGUINAL HERNIA REPAIR Right   ? INSERT / REPLACE / REMOVE PACEMAKER    ? JOINT REPLACEMENT Right 2018  ? LAPAROSCOPIC CHOLECYSTECTOMY    ? RIGHT/LEFT HEART CATH AND CORONARY/GRAFT ANGIOGRAPHY N/A 02/04/2021  ? Procedure: RIGHT/LEFT HEART CATH AND CORONARY/GRAFT ANGIOGRAPHY;  Surgeon: Nelva Bush, MD;  Location: [facility];  Service: Cardiovascular;  Laterality: N/A;  ? [place]  ? TOTAL HIP ARTHROPLASTY Right 10/22/2016  ? Procedure: TOTAL HIP ARTHROPLASTY;  Surgeon: Dereck Leep, MD;  Location: [facility];  Service: Orthopedics;  Laterality: Right;  ? TOTAL HIP ARTHROPLASTY Left 11/04/2017  ? Procedure: TOTAL HIP ARTHROPLASTY;  Surgeon: Dereck Leep, MD;  Location: [facility];  Service: Orthopedics;  Laterality: Left;  ? ? ?Assessment & Plan ?Clinical Impression: Pt is a 76 year old male admitted with with left-sided weakness and also slurred speech. PMH significant for history of A-fib on Eliquis, CLL,  COPD, CAD status post CABG, OSA on CPAP, hypertension, hyperlipidemia, sick sinus syndrome status post pacemaker who presents for evaluation of left-sided weakness.  Patient has had chronic shortness of breath since January when he had COVID but denies any acute changes.] ? ? ?Patient currently requires min with basic self-care skills secondary to muscle weakness, decreased cardiorespiratoy endurance, impaired timing and sequencing, unbalanced muscle activation, ataxia, and decreased coordination, decreased problem solving, decreased safety awareness, and decreased memory, and decreased  standing balance, decreased postural control, and decreased balance strategies.  Prior to hospitalization, patient could complete BADL/IADL with modified independent . ? ?Patient will benefit from skilled intervention to decrease level of assist with basic self-care skills and increase independence with basic self-care skills prior to discharge home with care partner.  Anticipate patient will require 24 hour supervision and follow up outpatient. ? ?OT - End of Session ?Endurance Deficit: Yes ?OT Assessment ?Rehab Potential (ACUTE ONLY): Good ?OT Patient demonstrates impairments in the following area(s): Balance;Cognition;Endurance;Motor;Perception;Safety ?OT Basic ADL's Functional Problem(s): Grooming;Bathing;Dressing ?OT Transfers Functional Problem(s): Toilet;Tub/Shower ?OT Additional Impairment(s): Fuctional Use of Upper Extremity ?OT Plan ?OT Intensity: Minimum of 1-2 x/day, 45 to 90 minutes ?OT Frequency: 5 out of 7 days ?OT Duration/Estimated Length of Stay: 9-12 ?OT Treatment/Interventions: Environmental education officer education;Therapeutic Activities;Therapeutic Exercise;Psychosocial support;Functional electrical stimulation;Cognitive remediation/compensation;Community reintegration;Functional mobility training;Self Care/advanced ADL retraining;UE/LE Strength taining/ROM;UE/LE Coordination activities;Skin care/wound managment;Neuromuscular re-education;Discharge planning;Disease mangement/prevention;Pain management;Visual/perceptual remediation/compensation;Splinting/orthotics ?OT Self Feeding Anticipated Outcome(s): MOD I ?OT Basic Self-Care Anticipated Outcome(s): MOD I ?OT Toileting Anticipated Outcome(s): MOD I toilet; S shower ?OT Bathroom Transfers Anticipated Outcome(s): MOD I toilet; S toilet ?OT Recommendation ?Patient destination: Home ?Follow Up Recommendations: Outpatient OT ?Equipment Recommended: Tub/shower seat;To be determined ? ? ?OT  Evaluation ?Precautions/Restrictions  ?Precautions ?Precautions: Fall ?Restrictions ?Weight Bearing Restrictions: No ?General ?Chart Reviewed: Yes ?Family/Caregiver Present: Yes ?Vital Signs ?Therapy Vitals ?Temp: 98.2 ?F (36.8 ?C) ?Pulse Rate: 91 ?Resp: (!) 21 ?BP: (!) 135/52 ?Patient Position (if appropriate): Sitting ?Oxygen Therapy ?SpO2: 97 % ?O2 Device: Room Air ?Pain ?  ?Home Living/Prior Functioning ?Home Living ?Family/patient expects to be discharged to:: Private residence ?Living Arrangements: Spouse/significant other ?Available Help at Discharge: Family, Available 24 hours/day ?Type of Home: House ?Home Access: Ramped entrance ?Home Layout: One level ? Lives With: Spouse ?Prior Function ?Level of Independence: Requires assistive device for independence, Independent with transfers, Independent with gait ? Able to Take Stairs?: Yes ?Vision ?Baseline Vision/History: 0 No visual deficits ?Ability to See in Adequate Light: 0 Adequate ?Patient Visual Report: No change from baseline ?Vision Assessment?: Yes ?Eye Alignment: Within Functional Limits ?Ocular Range of Motion: Within Functional Limits ?Tracking/Visual Pursuits: Able to track stimulus in all quads without difficulty ?Saccades: Within functional limits ?Convergence: Within functional limits ?Visual Fields: No apparent deficits ?Additional Comments: able to read menu and clock on wall ?Perception  ?Perception: Within Functional Limits ?Praxis ?Praxis: Impaired ?Praxis Impairment Details: Motor planning ?Cognition ?Cognition ?Overall Cognitive Status: Impaired/Different from baseline ?Arousal/Alertness: Awake/alert ?Orientation Level: Situation;Place;Person ?Person: Oriented ?Place: Oriented ?Situation: Oriented ?Memory: Impaired ?Attention: Selective ?Selective Attention: Impaired ?Awareness: Appears intact ?Problem Solving: Impaired ?Safety/Judgment: Impaired ?Brief Interview for Mental Status (BIMS) ?Repetition of Three Words (First Attempt):  3 ?Temporal Orientation: Year: Correct ?Temporal Orientation: Month: Accurate within 5 days ?Temporal Orientation: Day: Correct ?Recall: "Sock": Yes, no cue required ?Recall: "Blue": Yes, no cue required ?Recall: "Bed": Yes

## 2021-10-25 ENCOUNTER — Inpatient Hospital Stay (HOSPITAL_COMMUNITY): Payer: Medicare HMO

## 2021-10-25 NOTE — Progress Notes (Signed)
?                                                       PROGRESS NOTE ? ? ?Subjective/Complaints: ?No new complaints. Ready for another day of therapy today. Still generally sore. Feels that left arm is a little stronger. ? ?ROS: Patient denies fever, rash, sore throat, blurred vision, dizziness, nausea, vomiting, diarrhea, cough, shortness of breath or chest pain, joint or back/neck pain, headache, or mood change.  ? ? ?Objective: ?  ?CT CERVICAL SPINE WO CONTRAST ? ?Result Date: 10/24/2021 ?CLINICAL DATA:  Cervical radiculopathy. No red flag symptoms. Spasticity in the lower more than upper extremities EXAM: CT CERVICAL SPINE WITHOUT CONTRAST TECHNIQUE: Multidetector CT imaging of the cervical spine was performed without intravenous contrast. Multiplanar CT image reconstructions were also generated. RADIATION DOSE REDUCTION: This exam was performed according to the departmental dose-optimization program which includes automated exposure control, adjustment of the mA and/or kV according to patient size and/or use of iterative reconstruction technique. COMPARISON:  CTA of the neck 11/02/2015 FINDINGS: Alignment: Unremarkable Skull base and vertebrae: No acute fracture. No primary bone lesion or focal pathologic process. Soft tissues and spinal canal: No visible canal collection. Apparent epidural fat displacement at C7 on sagittal images is attributed to artifact based on source images. Disc levels: C1-2: Atlantodental and facet spurring.  No visible impingement C2-3: Uncovertebral and facet spurring with moderate left foraminal stenosis C3-4: Disc narrowing with partially calcified central protrusion. Bulky ventral spurring. Mild bilateral facet spurring. Spinal stenosis with implied cord impingement. Biforaminal impingement C4-5: Disc collapse with endplate and uncovertebral ridging causing biforaminal impingement. A disc osteophyte complex contacts the left ventral cord. Intervertebral ankylosis. C5-6: Disc  collapse with endplate and uncovertebral ridging causing moderate bilateral foraminal stenosis. Posterior ridging likely contacts the ventral cord. Intervertebral ankylosis. C6-7: Disc narrowing and bulging with extensive ridging and advanced right foraminal impingement. Partially calcified central protrusion likely impinges on the cord C7-T1:Facet and spondylitic spurring with right more than left foraminal impingement. Upper chest: No acute finding IMPRESSION: 1. 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 2. Multilevel foraminal impingement which is most advanced bilaterally at C3-4, bilaterally at C4-5, and on the right at C6-7. 3. 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 Electronically Signed   By: Jorje Guild M.D.   On: 10/24/2021 06:28   ?Recent Labs  ?  10/24/21 ?0512  ?WBC 7.7  ?HGB 9.8*  ?HCT 30.1*  ?PLT 207  ? ?Recent Labs  ?  10/24/21 ?0512  ?NA 138  ?K 3.7  ?CL 104  ?CO2 26  ?GLUCOSE 129*  ?BUN 11  ?CREATININE 0.81  ?CALCIUM 8.3*  ? ? ?Intake/Output Summary (Last 24 hours) at 10/25/2021 1030 ?Last data filed at 10/25/2021 0744 ?Gross per 24 hour  ?Intake 800 ml  ?Output 1075 ml  ?Net -275 ml  ?  ? ?  ? ?Physical Exam: ?Vital Signs ?Blood pressure (!) 132/57, pulse 75, temperature 98.5 ?F (36.9 ?C), resp. rate 16, height '5\' 9"'$  (1.753 m), weight 97 kg, SpO2 96 %. ? ?Constitutional: No distress . Vital signs reviewed. ?HEENT: NCAT, EOMI, oral membranes moist ?Neck: supple ?Cardiovascular: RRR without murmur. No JVD    ?Respiratory/Chest: CTA Bilaterally without wheezes or rales. Normal effort    ?GI/Abdomen: BS +, non-tender, non-distended ?Ext:  no clubbing, cyanosis, or edema ?Psych: pleasant and cooperative  ?Skin: Clean and intact without signs of breakdown ?Neuro:  LUE 3 to 3+/5. LLE 3/5 prox to 4/5 distally--inconsistent effort. Sensory 1/2 LUE and LLE. DTR's 3+ in LE's and at least 2+ UE's.  Clonus in both ankles.-- ?Musculoskeletal: generalized joint  tenderness with palpation.--no changes   ? ? ?Assessment/Plan: ?1. Functional deficits which require 3+ hours per day of interdisciplinary therapy in a comprehensive inpatient rehab setting. ?Physiatrist is providing close team supervision and 24 hour management of active medical problems listed below. ?Physiatrist and rehab team continue to assess barriers to discharge/monitor patient progress toward functional and medical goals ? ?Care Tool: ? ?Bathing ? Bathing activity did not occur: Refused (will do tomorrow) ?   ?   ?  ?  ?Bathing assist   ?  ?  ?Upper Body Dressing/Undressing ?Upper body dressing Upper body dressing/undressing activity did not occur (including orthotics): Refused ?  ?   ?Upper body assist Assist Level:  (reports will do tomorrow) ?   ?Lower Body Dressing/Undressing ?Lower body dressing ? ? ? Lower body dressing activity did not occur: Refused ?  ? ?  ? ?Lower body assist   ?   ? ?Toileting ?Toileting    ?Toileting assist Assist for toileting: Minimal Assistance - Patient > 75% ?  ?  ?Transfers ?Chair/bed transfer ? ?Transfers assist ? Chair/bed transfer activity did not occur: N/A ? ?Chair/bed transfer assist level: Minimal Assistance - Patient > 75% ?Chair/bed transfer assistive device: Walker ?  ?Locomotion ?Ambulation ? ? ?Ambulation assist ? ?   ? ?Assist level: Minimal Assistance - Patient > 75% ?Assistive device: Walker-rolling ?Max distance: >127f  ? ?Walk 10 feet activity ? ? ?Assist ?   ? ?Assist level: Contact Guard/Touching assist ?Assistive device: Walker-rolling  ? ?Walk 50 feet activity ? ? ?Assist   ? ?Assist level: Minimal Assistance - Patient > 75% ?Assistive device: Walker-rolling  ? ? ?Walk 150 feet activity ? ? ?Assist   ? ?Assist level: Minimal Assistance - Patient > 75% ?Assistive device: Walker-rolling ?  ? ?Walk 10 feet on uneven surface  ?activity ? ? ?Assist   ? ? ?Assist level: Minimal Assistance - Patient > 75% ?Assistive device: Walker-rolling   ? ?Wheelchair ? ? ? ? ?Assist Is the patient using a wheelchair?: Yes ?Type of Wheelchair: Manual ?  ? ?Wheelchair assist level: Supervision/Verbal cueing ?Max wheelchair distance: >200 ft  ? ? ?Wheelchair 50 feet with 2 turns activity ? ? ? ?Assist ? ?  ?  ? ? ?Assist Level: Supervision/Verbal cueing  ? ?Wheelchair 150 feet activity  ? ? ? ?Assist ?   ? ? ?Assist Level: Supervision/Verbal cueing  ? ?Blood pressure (!) 132/57, pulse 75, temperature 98.5 ?F (36.9 ?C), resp. rate 16, height '5\' 9"'$  (1.753 m), weight 97 kg, SpO2 96 %. ? ?Medical Problem List and Plan: ?1. Functional deficits secondary to right sided ischemic stroke and debility from recent Covid 19 infection ?            -patient may  shower ?            -ELOS/Goals: 7-10d Mod I ?2.  Antithrombotics: ?-DVT/anticoagulation:  Pharmaceutical: Other (comment)Eliquis ?            -antiplatelet therapy: none ?3. Pain Management: Tylenol, Tramadol prn ?  ?4. Mood: LCSW to evaluate and provide emotional support ?            -antipsychotic agents:  n/a ?5. Neuropsych: This patient is capable of making decisions on his own behalf. ?6. Skin/Wound Care: Routine skin care checks ?7. Fluids/Electrolytes/Nutrition: encourage PO ? I personally reviewed the patient's labs today.   ?8: Hypertension: continue Imdur ? 4/7 bp controlled ?9: Chronic diastolic HF: euvolemic; continue to monitor ?            --need daily weights ?            --continue Lasix 40 mg daily and monitor K+ ?  ?Filed Weights  ? 10/23/21 1356  ?Weight: 97 kg  ?  ?10: Hyperlipidemia: continue Zetia (on Praluent at home) ?11: CAD: s/p CABG 2010; left heart cath 01/2021 ?--CP yesterday, 4/4: EKG and troponin negative ?            --NTG as needed ?12: Atrial fibrillation: continue Eliquis, Pacerone ?13: COPD: Continue Dulera ?            --albuterol nebs as needed ? -encouraged IS, OOB ?14: GERD: ?15: OSA: CPAP ?  ?14: PTSD: continue Remeron and trazodone ?15: CLL: Home meds held due to recent Covid  infection ?            --follow-up with Dr. 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  Pt with bilateral sustained clonus at the ankles 3+ DTR throughout ? -cervical spine CT revi

## 2021-10-25 NOTE — Care Management (Signed)
Inpatient Rehabilitation Center ?Individual Statement of Services ? ?Patient Name:  [name]  ?Date:  10/25/2021 ? ?Welcome to the Hall.  Our goal is to provide you with an individualized program based on your diagnosis and situation, designed to meet your specific needs.  With this comprehensive rehabilitation program, you will be expected to participate in at least 3 hours of rehabilitation therapies Monday-Friday, with modified therapy programming on the weekends. ? ?Your rehabilitation program will include the following services:  Physical Therapy (PT), Occupational Therapy (OT), Speech Therapy (ST), 24 hour per day rehabilitation nursing, Therapeutic Recreaction (TR), Psychology, Neuropsychology, Care Coordinator, Rehabilitation Medicine, Nutrition Services, Pharmacy Services, and Other ? ?Weekly team conferences will be held on Tuesdays to discuss your progress.  Your Inpatient Rehabilitation Care Coordinator will talk with you frequently to get your input and to update you on team discussions.  Team conferences with you and your family in attendance may also be held. ? ?Expected length of stay: 9-12 days   ? ? ?Overall anticipated outcome: Supervision ? ?Depending on your progress and recovery, your program may change. Your Inpatient Rehabilitation Care Coordinator will coordinate services and will keep you informed of any changes. Your Inpatient Rehabilitation Care Coordinator's name and contact numbers are listed  below. ? ?The following services may also be recommended but are not provided by the [facility]:  ?Driving Evaluations ?Home Health Rehabiltiation Services ?Outpatient Rehabilitation Services ?Vocational Rehabilitation ?  ?Arrangements will be made to provide these services after discharge if needed.  Arrangements include referral to agencies that provide these services. ? ?Your insurance has been verified to be:  [employer] ? ?Your primary  doctor is:  [name] ? ?Pertinent information will be shared with your doctor and your insurance company. ? ?Inpatient Rehabilitation Care Coordinator:  Cathleen Corti [phone] or (C) [phone] ? ?Information discussed with and copy given to patient by: Rana Snare, 10/25/2021, 10:47 AM    ?

## 2021-10-25 NOTE — Progress Notes (Signed)
Patient placed on CPAP at 8 cm. Patient tolerating well. ?

## 2021-10-25 NOTE — Progress Notes (Addendum)
Patient ID: [name], male   DOB: Aug 18, 1945, 76 y.o.   MRN: [id number] ? ?SW emailed pt assigned [place] ([email or web address]) to discuss patient care needs ar discharge and waiting on follow-up.  ? ?Pt is [facility]% [facility] ?PCP- [facility], Dr. Orland Mustard [phone] ? ? ?SW spoke with pt wife [name] to inform on ELOS 9-12 days, a\nd will f/u with updates on Tuesday.  ? ?Loralee Pacas, MSW, LCSWA ?Office: [phone] ?Cell: [phone] ?Fax: [phone]  ?

## 2021-10-25 NOTE — Progress Notes (Signed)
We have put a call into Dr. Donivan Scull office, cardiology.  We are told that his pacemaker is MRI compatible. ? ?I will order a brain and cervical MRI and will follow-up afterwards with further recommendations once the MRIs are done.[name] ?

## 2021-10-25 NOTE — IPOC Note (Signed)
Overall Plan of Care (IPOC) ?Patient Details ?Name: [name] ?MRN: [id number] ?DOB: 1945-09-02 ? 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 ?Hospital Problems: Principal Problem: ?  Stroke (cerebrum) ([place]) ?Cervical myelopathy ? ? ? Functional Problem List: ?Nursing Bowel, Endurance, Edema, Motor, Perception, Safety  ?PT Balance, Perception, Behavior, Safety, Edema, Sensory, Skin Integrity, Endurance, Motor, Nutrition, Pain  ?OT Balance, Cognition, Endurance, Motor, Perception, Safety  ?SLP Cognition  ?TR    ?    ? Basic ADL?s: ?OT Grooming, Bathing, Dressing  ? ?  Advanced  ADL?s: ?OT    ?   ?Transfers: ?PT Bed Mobility, Bed to Chair, Car, Furniture  ?OT Toilet, Tub/Shower  ? ?  Locomotion: ?PT Ambulation, Wheelchair Mobility, Stairs  ? ?  Additional Impairments: ?OT Fuctional Use of Upper Extremity  ?SLP Social Cognition ?  ?Attention, Problem Solving, Memory  ?TR    ? ? ?Anticipated Outcomes ?Item Anticipated Outcome  ?Self Feeding MOD I  ?Swallowing ?   ?  ?Basic self-care ? MOD I  ?Toileting ? MOD I toilet; S shower ?  ?Bathroom Transfers MOD I toilet; S toilet  ?Bowel/Bladder ? supervision  ?Transfers ? mod I using LRAD  ?Locomotion ? Supervision using LRAD >200 ft using LRAD  ?Communication ?    ?Cognition ? Mod I  ?Pain ? n/a  ?Safety/Judgment ? supervision  ? ?Therapy Plan: ?PT Intensity: Minimum of 1-2 x/day ,45 to 90 minutes ?PT Frequency: 5 out of 7 days ?PT Duration Estimated Length of Stay: 10-12 days ?OT Intensity: Minimum of 1-2 x/day, 45 to 90 minutes ?OT Frequency: 5 out of 7 days ?OT Duration/Estimated Length of Stay: 9-12 ?SLP Intensity: Minumum of 1-2 x/day, 30 to 90 minutes ?SLP Frequency: 1 to 3 out of 7 days ?SLP Duration/Estimated Length of Stay: 10-12 days  ? ?Due to the current state of emergency, patients may not be receiving their 3-hours of Medicare-mandated therapy. ? ? Team Interventions: ?Nursing Interventions Patient/Family Education, Bowel Management, Disease  Management/Prevention, Medication Management, Discharge Planning  ?PT interventions Ambulation/gait training, Cognitive remediation/compensation, Discharge planning, DME/adaptive equipment instruction, Functional mobility training, Pain management, Splinting/orthotics, Psychosocial support, Therapeutic Activities, UE/LE Strength taining/ROM, Visual/perceptual remediation/compensation, Wheelchair propulsion/positioning, UE/LE Coordination activities, Therapeutic Exercise, Stair training, Skin care/wound management, Patient/family education, Neuromuscular re-education, Functional electrical stimulation, Disease management/prevention, Academic librarian, Training and development officer  ?OT Interventions Training and development officer, DME/adaptive equipment instruction, Patient/family education, Therapeutic Activities, Therapeutic Exercise, Psychosocial support, Functional electrical stimulation, Cognitive remediation/compensation, Community reintegration, Functional mobility training, Self Care/advanced ADL retraining, UE/LE Strength taining/ROM, UE/LE Coordination activities, Skin care/wound managment, Neuromuscular re-education, Discharge planning, Disease mangement/prevention, Pain management, Visual/perceptual remediation/compensation, Splinting/orthotics  ?SLP Interventions Cognitive remediation/compensation, Internal/external aids, Cueing hierarchy, Environmental controls, Functional tasks, Patient/family education, Therapeutic Activities  ?TR Interventions    ?SW/CM Interventions Discharge Planning, Psychosocial Support, Patient/Family Education  ? ?Barriers to Discharge ?MD  Medical stability  ?Nursing Decreased caregiver support, Lack of/limited family support, Weight ?1 level, ramped entrance. Spouse to provide care at discharge.  ?PT Decreased caregiver support ?   ?OT   ?   ?SLP   ?   ?SW Decreased caregiver support, Lack of/limited family support ?   ? ?Team Discharge Planning: ?Destination: PT-Home ,OT-  Home , SLP-Home ?Projected Follow-up: PT-Outpatient PT, OT-  Outpatient OT, SLP- (TBD) ?Projected Equipment Needs: PT-Rolling walker with 5" wheels, OT- Tub/shower seat, To be determined, SLP-None recommended by SLP ?Equipment Details: Probation officer, OT-  ?Patient/family involved in discharge planning: PT- Patient,  OT-Patient, Family member/caregiver, SLP-Patient ? ?MD  ELOS: 10-12 days ?Medical Rehab Prognosis:  Excellent ?Assessment: The patient has been admitted for CIR therapies with the diagnosis of right brain cva with left hemiparesis. Presentation also complicated by significant cervical stenosis with likely  myelopathy. The team will be addressing functional mobility, strength, stamina, balance, safety, adaptive techniques and equipment, self-care, bowel and bladder mgt, patient and caregiver education, cognition, NMR, pain mgt, community reentry. Goals have been set at mod I for cognition, mobility and self-care. Anticipated discharge destination is home with family. ? ?Due to the current state of emergency, patients may not be receiving their 3 hours per day of Medicare-mandated therapy.  ? ? ?Meredith Staggers, MD, FAAPMR   ? ? ?See Team Conference Notes for weekly updates to the plan of care  ?

## 2021-10-25 NOTE — Progress Notes (Signed)
Speech Language Pathology Daily Session Note ? ?Patient Details  ?Name: [name] ?MRN: [id number] ?Date of Birth: [birth year] ? ?Today's Date: 10/25/2021 ?SLP Individual Time: 6389-3734 ?SLP Individual Time Calculation (min): 44 min ? ?Short Term Goals: ?Week 1: SLP Short Term Goal 1 (Week 1): Patient will recall new, daily information with supervision level verbal cues for use of compensatory strategies. ?SLP Short Term Goal 2 (Week 1): Patient will demonstrate functional problem solving for mildly complex tasks with Mod I. ?SLP Short Term Goal 3 (Week 1): Patient will demonstrate divided attention with high-level functional tasks for 30 mintues with supervision level verbal cues for redirection. ? ?Skilled Therapeutic Interventions: ?Pt seen for skilled ST with focus on cognitive goals, pt in bed and pleasantly agreeable to all therapeutic tasks. Pt states he was initially "a little confused with what they were having me do" when admitted to hospital/during therapies, however he states this has resolved and he feels very close to baseline cognitive function. SLP facilitating functional recall task by providing overall supervision A cues for recall of 5/5 pieces of novel information from morning and previous date. Pt completing mildly complex problem solving for medication management with Mod I, Supervision A cues for problem solving during functional mathematics activity. Pt left in bed with alarm set and all needs within reach, cont ST POC. ? ?Pain ?Pain Assessment ?Pain Scale: 0-10 ?Pain Score: 0-No pain ? ?Therapy/Group: Individual Therapy ? ?Dewaine Conger ?10/25/2021, 9:39 AM ?

## 2021-10-25 NOTE — Progress Notes (Signed)
Physical Therapy Session Note ? ?Patient Details  ?Name: [name] ?MRN: [id number] ?Date of Birth: 08-04-1945 ? ?Today's Date: 10/25/2021 ?PT Individual Time:  0814-4818 ?PT Individual Time Calculation (min): 48 min ? ?Short Term Goals: ?Week 1:  PT Short Term Goal 1 (Week 1): Patient will perform basic transfers with supervision consistently. ?PT Short Term Goal 2 (Week 1): Patient will ambulate with CGA >150 ft using LRAD. ?PT Short Term Goal 3 (Week 1): Patient will perform a standardized balance assessment. ? ? ?Skilled Therapeutic Interventions/Progress Updates:  ?Patient supine in bed on entrance to room. Patient alert and agreeable to PT session.  ? ?Patient with no pain complaint throughout session. ? ?Therapeutic Activity: ?Bed Mobility: Patient performed supine <> sit with supervision requiring extra time and effort to complete to fully squared on EOB. VC/ tc required for technique. ?Transfers: Patient performed sit<>stand and stand pivot transfers throughout session with Min up to Mod A for balance. Provided verbal cues for decreasing posterior lean, sequencing. ? ?Gait Training:  ?Patient ambulated 150' x2 using RW with MinA overall. Demonstrated increased L knee flexion, decreased step height, length on L, decreased stance time on L, flexed posture. Provided vc/ tc for upright posture, level gaze, and increasing hip/ knee flexion in swing phase to improve heel strike and knee ext. ? ?Neuromuscular Re-ed: ?NMR facilitated during session with focus on standing balance. Pt guided in beanbag toss to target 3x18bags per bout. First bout with static stance and requirement to collect bag with R hand from L side and collection of bag with L hand from R side. Second and third bouts with dynamic stepping and collection of bags as well as toss to be completed with pt's choice of UE. No LOB throughout with learning required to coordinate UE with dynamic stepping. NMR performed for improvements in motor control  and coordination, balance, sequencing, judgement, and self confidence/ efficacy in performing all aspects of mobility at highest level of independence.  ? ?Patient supine  in bed at end of session with brakes locked, bed alarm set, and all needs within reach. ? ? ?Therapy Documentation ?Precautions:  ?Precautions ?Precautions: Fall ?Restrictions ?Weight Bearing Restrictions: No ?General: ?  ?Vital Signs: ? ?Pain: ?Pain Assessment ?Pain Scale: Faces ?Pain Score: 0-No pain ?Faces Pain Scale: No hurt ? ?Therapy/Group: Individual Therapy ? ?Alger Simons PT, DPT ?10/25/2021, 12:20 PM  ?

## 2021-10-25 NOTE — Progress Notes (Signed)
Physical Therapy Session Note ? ?Patient Details  ?Name: [name] ?MRN: [id number] ?Date of Birth: [birth year] ? ?Today's Date: 10/25/2021 ?PT Individual Time: [phone] ?   ? ?Short Term Goals: ?Week 1:  PT Short Term Goal 1 (Week 1): Patient will perform basic transfers with supervision consistently. ?PT Short Term Goal 2 (Week 1): Patient will ambulate with CGA >150 ft using LRAD. ?PT Short Term Goal 3 (Week 1): Patient will perform a standardized balance assessment. ? ? ?Skilled Therapeutic Interventions/Progress Updates:  ? ?Pt received supine in bed and agreeable to PT. Supine>sit transfer with supervision assist and cues for decreased use of bed rail as able.  ? ?Gait training through hall x 128ft and 143ft with RW and CGA-Supervision assist form PT with cues for increased step width on the L. Dynamic gait training with RW to weaver through 8 cones x 2 with min assist and cues for AD management and improved posture with turns to the R to reduce LLE trendelenberg. Stepping over 1-4 inch obstacles with RW 2 x 6 with alternating leading with the BLE min assist for safety and AD management.   ? ?Seated NMR with level 3 tband: hip abduction, hip flexion, LAQ, HS curl, ankle PF, ankle DF. Each performed x 12 with cues for full ROM and decreased ROM on the R.    ? ?Pt performed 5 time sit<>stand (5xSTS): 23 sec (>15 sec indicates increased fall risk)  30sec, 22sec, 18sec.  ? ?Patient returned to room and performed stand pivot to EOB with CGA. Pt left sitting in recliner with call bell in reach and all needs met.   ? ? ? ? ?   ? ?Therapy Documentation ?Precautions:  ?Precautions ?Precautions: Fall ?Restrictions ?Weight Bearing Restrictions: No ? ?Pain: ?Pain Assessment ?Pain Scale: Faces ?Pain Score: 0-No pain ?Faces Pain Scale: No hurt ? ? ? ?Therapy/Group: Individual Therapy ? ?[name] ?10/25/2021, 10:29 AM  ?

## 2021-10-25 NOTE — Progress Notes (Signed)
Occupational Therapy Session Note ? ?Patient Details  ?Name: [name] ?MRN: [id number] ?Date of Birth: [birth year] ? ?Today's Date: 10/25/2021 ?OT Individual Time: 1100-1200 ?OT Individual Time Calculation (min): 60 min  ? ?Today's Date: 10/25/2021 ?OT Individual Time: 8616-8372 ?OT Individual Time Calculation (min): 90 min  ? ?Short Term Goals: ?Week 1:  OT Short Term Goal 1 (Week 1): Pt will complete full ADL with no more than min cuing for safety awareness ?OT Short Term Goal 2 (Week 1): Pt will improve LUE function on [facility] assessments by 20% ?OT Short Term Goal 3 (Week 1): Pt will completes functional transfers with LRAD and supervision ? ?Skilled Therapeutic Interventions/Progress Updates:  ?  Session 1: ?Pt received in bed with no pain initially but gradually increased pain/fatigue in L extremities. Pt delcining intervention for pain. ? ?Neuromuscular Reeducation ?Pt completes all functional mobility to/from tx spaces with increased time, use of RW, and VC for decreased external rotation and stepping wider.  ? ?Diagonal reaching patterns using horse shoes, clothes pins, and dishes in tx gym/kitchen in standing with CGA facilitating weight shift, scapular to improve tactile input and trunk elongation/rotation. Ranges from 6 inches from the floor up to overhead height. Pt does well with seated rest breaks d/t decreased activity tolernace.  ? ?Standing bits with forced use of LUE during memory task. Able ot recall up to 5 words in order.  ? ?Pt left at end of session in bed with exit alarm on, call light in reach and all needs met ? ?Session 2: ? ?Pt received in bed agreeable to OT and shower this date. ?ADL: ?Pt completes ADL at overall CGA-MIN Level. Skilled interventions include: CGA for mobility into bathroom and gathering needed items from low drawer. Pt requires cuing for RW management and divided attention as when pt is talking more prone to lose balance. Pt able to doff clothes with S-CGA and stands for  part of shower to bathe UB and peri area with 1 LOB posteriorly requiring MIN A to recover. Seated bathing proceeded for LB and rinsing. Pt able to don clothing sit to stand from toilet for LB and EOB for UB and footwear.  ? ?Therapeutic activity ?Functional mobility to/from tx gym with CGA ? ?2x5 sit to stand with no hand push  ?Pt completes 3x30 ball toss (chest, bounce, overhead pass) in seated position with 1.5 # wrist weights to improve BUE coordination/strengthening required for BADLs/functional transfers. ? ? ?Pt left at end of session in edge of bed with exit alarm on, call light in reach and all needs met ? ? ?Therapy Documentation ?Precautions:  ?Precautions ?Precautions: Fall ?Restrictions ?Weight Bearing Restrictions: No ?General: ?  ? ?Therapy/Group: Individual Therapy ? ?Lowella Dell Venson Ferencz ?10/25/2021, 6:55 AM ?

## 2021-10-26 MED ORDER — DIAZEPAM 5 MG PO TABS
5.0000 mg | ORAL_TABLET | Freq: Every day | ORAL | Status: DC | PRN
Start: 2021-10-26 — End: 2021-10-29
  Administered 2021-10-28: 5 mg via ORAL
  Filled 2021-10-26: qty 1

## 2021-10-26 NOTE — Progress Notes (Signed)
?                                                       PROGRESS NOTE ? ? ?Subjective/Complaints: ?Discussed CT findings of spinal stenosis with patient and family, discussed that Dr. Arnoldo Morale spoke with cardiology and his pacemaker is MRI compatible.  ?Requests medication to help with anxiety during MRI ? ?ROS: Patient denies fever, rash, sore throat, blurred vision, dizziness, nausea, vomiting, diarrhea, cough, shortness of breath or chest pain, joint or back/neck pain, headache, or mood change. +anxiety with MRI ? ? ?Objective: ?  ?No results found. ?Recent Labs  ?  10/24/21 ?0512  ?WBC 7.7  ?HGB 9.8*  ?HCT 30.1*  ?PLT 207  ? ?Recent Labs  ?  10/24/21 ?0512  ?NA 138  ?K 3.7  ?CL 104  ?CO2 26  ?GLUCOSE 129*  ?BUN 11  ?CREATININE 0.81  ?CALCIUM 8.3*  ? ? ?Intake/Output Summary (Last 24 hours) at 10/26/2021 1332 ?Last data filed at 10/26/2021 1243 ?Gross per 24 hour  ?Intake 413 ml  ?Output 850 ml  ?Net -437 ml  ?  ? ?  ? ?Physical Exam: ?Vital Signs ?Blood pressure 117/88, pulse 93, temperature 98 ?F (36.7 ?C), temperature source Oral, resp. rate 18, height '5\' 9"'$  (1.753 m), weight 97 kg, SpO2 97 %. ? ?Gen: no distress, normal appearing ?HEENT: oral mucosa pink and moist, NCAT ?Cardio: Reg rate ?Chest: normal effort, normal rate of breathing ?Abd: soft, non-distended ?Ext: no edema ?Psych: pleasant, normal affect ?Skin: Clean and intact without signs of breakdown ?Neuro:  LUE 3 to 3+/5. LLE 3/5 prox to 4/5 distally--inconsistent effort. Sensory 1/2 LUE and LLE. DTR's 3+ in LE's and at least 2+ UE's.  Clonus in both ankles.-- ?Musculoskeletal: generalized joint tenderness with palpation.--no changes   ? ? ?Assessment/Plan: ?1. Functional deficits which require 3+ hours per day of interdisciplinary therapy in a comprehensive inpatient rehab setting. ?Physiatrist is providing close team supervision and 24 hour management of active medical problems listed below. ?Physiatrist and rehab team continue to assess barriers to  discharge/monitor patient progress toward functional and medical goals ? ?Care Tool: ? ?Bathing ? Bathing activity did not occur: Refused (will do tomorrow) ?Body parts bathed by patient: Right arm, Left arm, Chest, Abdomen, Front perineal area, Buttocks, Right upper leg, Left upper leg, Right lower leg, Left lower leg, Face  ?   ?  ?  ?Bathing assist Assist Level: Minimal Assistance - Patient > 75% ?  ?  ?Upper Body Dressing/Undressing ?Upper body dressing Upper body dressing/undressing activity did not occur (including orthotics): Refused ?What is the patient wearing?: Pull over shirt ?   ?Upper body assist Assist Level: Supervision/Verbal cueing ?   ?Lower Body Dressing/Undressing ?Lower body dressing ? ? ? Lower body dressing activity did not occur: Refused ?What is the patient wearing?: Pants, Underwear/pull up ? ?  ? ?Lower body assist Assist for lower body dressing: Minimal Assistance - Patient > 75% ?   ? ?Toileting ?Toileting    ?Toileting assist Assist for toileting: Minimal Assistance - Patient > 75% ?  ?  ?Transfers ?Chair/bed transfer ? ?Transfers assist ? Chair/bed transfer activity did not occur: N/A ? ?Chair/bed transfer assist level: Minimal Assistance - Patient > 75% ?Chair/bed transfer assistive device: Walker ?  ?Locomotion ?Ambulation ? ? ?Ambulation assist ? ?   ? ?  Assist level: Minimal Assistance - Patient > 75% ?Assistive device: Walker-rolling ?Max distance: >176f  ? ?Walk 10 feet activity ? ? ?Assist ?   ? ?Assist level: Contact Guard/Touching assist ?Assistive device: Walker-rolling  ? ?Walk 50 feet activity ? ? ?Assist   ? ?Assist level: Minimal Assistance - Patient > 75% ?Assistive device: Walker-rolling  ? ? ?Walk 150 feet activity ? ? ?Assist   ? ?Assist level: Minimal Assistance - Patient > 75% ?Assistive device: Walker-rolling ?  ? ?Walk 10 feet on uneven surface  ?activity ? ? ?Assist   ? ? ?Assist level: Minimal Assistance - Patient > 75% ?Assistive device: Walker-rolling   ? ?Wheelchair ? ? ? ? ?Assist Is the patient using a wheelchair?: Yes ?Type of Wheelchair: Manual ?  ? ?Wheelchair assist level: Supervision/Verbal cueing ?Max wheelchair distance: >200 ft  ? ? ?Wheelchair 50 feet with 2 turns activity ? ? ? ?Assist ? ?  ?  ? ? ?Assist Level: Supervision/Verbal cueing  ? ?Wheelchair 150 feet activity  ? ? ? ?Assist ?   ? ? ?Assist Level: Supervision/Verbal cueing  ? ?Blood pressure 117/88, pulse 93, temperature 98 ?F (36.7 ?C), temperature source Oral, resp. rate 18, height '5\' 9"'$  (1.753 m), weight 97 kg, SpO2 97 %. ? ?Medical Problem List and Plan: ?1. Functional deficits secondary to right sided ischemic stroke and debility from recent Covid 19 infection ?            -patient may  shower ?            -ELOS/Goals: 7-10d Mod I ? -Continue CIR ?2.  Antithrombotics: ?-DVT/anticoagulation:  Pharmaceutical: Other (comment)Eliquis ?            -antiplatelet therapy: none ?3. Pain Management: Tylenol, Tramadol prn ?  ?4. Mood: LCSW to evaluate and provide emotional support ?            -antipsychotic agents: n/a ?5. Neuropsych: This patient is capable of making decisions on his own behalf. ?6. Skin/Wound Care: Routine skin care checks ?7. Fluids/Electrolytes/Nutrition: encourage PO ? I personally reviewed the patient's labs today.   ?8: Hypertension: continue Imdur ? 4/7 bp controlled ?9: Chronic diastolic HF: euvolemic; continue to monitor ?            --need daily weights ?            --continue Lasix 40 mg daily and monitor K+ ? 4/8: messaged nurse to please weigh patient ?  ?Filed Weights  ? 10/23/21 1356  ?Weight: 97 kg  ?  ?10: Hyperlipidemia: continue Zetia (on Praluent at home) ?11: CAD: s/p CABG 2010; left heart cath 01/2021 ?--CP yesterday, 4/4: EKG and troponin negative ?            --NTG as needed ?12: Atrial fibrillation: continue Eliquis, Pacerone ?13: COPD: Continue Dulera ?            --albuterol nebs as needed ? -encouraged IS, OOB ?14: GERD: ?15: OSA: continue CPAP ?   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            --follow-up with Dr. 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  Pt with bilateral sustained clonus at the ankles 3+ DTR throughout ? 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  ? -discussed with patient that cardiology has said  his pacemaker is MRI compatible- will be able to obtain Monday as currently shortstaffed ?18. Anxiety with MRI: Valium '5mg'$  ordered for 30 minutes prior to MRI, discussed with nursing.  ? ?LOS: ?3 days ?A FACE TO FACE EVALUATION WAS PERFORMED ? ?Martha Clan P Jaylah Goodlow ?10/26/2021, 1:32 PM  ? ?  ?

## 2021-10-26 NOTE — Progress Notes (Signed)
Placed patient on CPAP set at 8cm for night.  ? ? ?

## 2021-10-26 NOTE — Progress Notes (Signed)
Occupational Therapy Session Note ? ?Patient Details  ?Name: [name] ?MRN: [id number] ?Date of Birth: January 13, 1946 ? ?Today's Date: 10/26/2021 ?OT Individual Time: 0700-0800 ?OT Individual Time Calculation (min): 60 min  ? ? ?Short Term Goals: ?Week 1:  OT Short Term Goal 1 (Week 1): Pt will complete full ADL with no more than min cuing for safety awareness ?OT Short Term Goal 2 (Week 1): Pt will improve LUE function on [facility] assessments by 20% ?OT Short Term Goal 3 (Week 1): Pt will completes functional transfers with LRAD and supervision ? ?Skilled Therapeutic Interventions/Progress Updates:  ?   ?Pt received in EOB with no pain reported. Mild back pain with reaching activity which was changed to decrease pain ?ADL: ?Pt completes standing grooming and toileting with RW over toileting with CGA-S overall. Cuing for Rw management over threshold as pt occasionally only puts back legs of walker on the ground when stepping up onto ramp surface into bathroom ? ?Therapeutic activity ?Dynamic reaching on compliant wedge to facilitate weigh shift in regular and staggered stance with CGA-S in reg stance, and CGA-MIN in staggered stance while manipluating push pins and matching color of push pin onto cork board picture. Pt needs cuing to decrease compensatory strategies to focus on [facility] palm<>finger translation.  ? ?Obstacle course over 2 objects, weaving through cones, walking over compliant surface ~10 feet , and 5 sit to stands ? ~2 min first time through ?~7 min second time with cognitive demand of saying animals in alphabetical order for dual task. Pt stops when thinking of letter/animal and completes 7 sit to stands instead of 5 ?Pt able ot identify stopping to think-good awareness  ? ?Pt left at end of session in EOB with exit alarm on, call light in reach and all needs met ? ? ?Therapy Documentation ?Precautions:  ?Precautions ?Precautions: Fall ?Restrictions ?Weight Bearing Restrictions: No ? ? ?Therapy/Group:  Individual Therapy ? ?Lowella Dell Lenny Bouchillon ?10/26/2021, 6:40 AM ?

## 2021-10-26 NOTE — Progress Notes (Signed)
Physical Therapy Session Note ? ?Patient Details  ?Name: [name] ?MRN: [id number] ?Date of Birth: 1946-03-20 ? ?Today's Date: 10/26/2021 ?PT Individual Time: Session 1: [id number] ?   Session 2: 6468-0321 ?PT Individual Time Calculation (min):  Session 2: 58 min  ?Session 2: 71 min ? ?Short Term Goals: ?Week 1:  PT Short Term Goal 1 (Week 1): Patient will perform basic transfers with supervision consistently. ?PT Short Term Goal 2 (Week 1): Patient will ambulate with CGA >150 ft using LRAD. ?PT Short Term Goal 3 (Week 1): Patient will perform a standardized balance assessment. ? ?Skilled Therapeutic Interventions/Progress Updates:  ? ?Session 1: ?Pt received semi-reclined in bed with no c/o pain and agreeable to PT. Session focused on walking endurance, stair training, LE strengthening, and balance activities to promote independent access of home. Pt initiated session with transfer to standing with supervision + RW. Pt then completed >1011f amb during 10 min walk with supervision + RW and no rest. Of note, pt navigated community spaces of hallways, elevators, and cafeteria with RW. Pt then completed 2x4 steps with B rails and close supervision. Pt noted to have fatigue at took 2 min rest break. Pt then amb 3031fto return to room with sup and RW. In room, pt completed 1x3 squat with 6inch items in hand to touch the ground. Pt noted to need sup during squat and minA to return to standing. Pt then completed 2x standing with eyes closed for 30 sec with CGA to maintain balance. At end of session, pt was left semi-reclined in bed with nurse call bell, alarm engaged, and all needs in reach.  ? ?Session 2: ?Pt received semi-reclined in bed with no c/o pain and agreeable to PT. Session focused on endurance, LE strengthening and bed mobility activities to promote functional recovery and strength increases in case of transferring from floor. Pt initiated session with NuStep completing 10 mins of interval training at  low-med-high workloads each minute (Level 5-6-7). Pt then practiced squat exercise from previous session using bowling pins in each hand to tap ground, pt completed 3x3. Of note, pt was instructed to keep bowling pin at length where he could comfortably squat and return to standing. Pt instructed to use PVC pipe or similar item at home at a length safe for squat. Pt instructed to tape where hand placement is safe for squat and return to stand with no assist, noting that length of pipe can decrease as he improves. Pt then transferred to mat table to practice rolling to hands and knees which he completed with supervision and increased time. Pt then returned to room to complete 2x standing with eyes closed for 30 sec with CGA to maintain balance. At end of session, pt was left semi-reclined in bed with nurse call bell, alarm engaged, and all needs in reach.  ? ? ? ?Therapy Documentation ?Precautions:  ?Precautions ?Precautions: Fall ?Restrictions ?Weight Bearing Restrictions: No ? ? ? ? ?Therapy/Group: Individual Therapy ? ?KiMarquette SaaPT, DPT ?10/26/2021, 11:43 AM ?

## 2021-10-26 NOTE — Progress Notes (Signed)
New orders placed by Dr. Ranell Patrick for MR Brain WO Contrast ?MR Cervical Spine WO Contrast. ?Spoke with Summer/MRI due to patient having a pacemaker, test will not be performed until Monday. MD aware. MD ok with test being performed Monday. ?

## 2021-10-27 NOTE — Progress Notes (Signed)
?                                                       PROGRESS NOTE ? ? ?Subjective/Complaints: ?Somnolent ?Placed on CPAP last night ?Patient's chart reviewed- No issues reported overnight ?Vitals signs stable  ? ?ROS: Patient denies fever, rash, sore throat, blurred vision, dizziness, nausea, vomiting, diarrhea, cough, shortness of breath or chest pain, joint or back/neck pain, headache, or mood change. +anxiety with MRI ? ? ?Objective: ?  ?No results found. ?No results for input(s): WBC, HGB, HCT, PLT in the last 72 hours. ? ?No results for input(s): NA, K, CL, CO2, GLUCOSE, BUN, CREATININE, CALCIUM in the last 72 hours. ? ? ?Intake/Output Summary (Last 24 hours) at 10/27/2021 0908 ?Last data filed at 10/27/2021 0424 ?Gross per 24 hour  ?Intake 657 ml  ?Output 450 ml  ?Net 207 ml  ?  ? ?  ? ?Physical Exam: ?Vital Signs ?Blood pressure 137/73, pulse 86, temperature 98 ?F (36.7 ?C), temperature source Oral, resp. rate 16, height '5\' 9"'$  (1.753 m), weight 96 kg, SpO2 98 %. ? ?Gen: no distress, normal appearing ?HEENT: oral mucosa pink and moist, NCAT ?Cardio: Reg rate ?Chest: normal effort, normal rate of breathing ?Abd: soft, non-distended ?Ext: no edema ?Psych: pleasant, normal affect ? ?Skin: Clean and intact without signs of breakdown ?Neuro:  LUE 3 to 3+/5. LLE 3/5 prox to 4/5 distally--inconsistent effort. Sensory 1/2 LUE and LLE. DTR's 3+ in LE's and at least 2+ UE's.  Clonus in both ankles.-- ?Musculoskeletal: generalized joint tenderness with palpation.--no changes   ? ? ?Assessment/Plan: ?1. Functional deficits which require 3+ hours per day of interdisciplinary therapy in a comprehensive inpatient rehab setting. ?Physiatrist is providing close team supervision and 24 hour management of active medical problems listed below. ?Physiatrist and rehab team continue to assess barriers to discharge/monitor patient progress toward functional and medical goals ? ?Care Tool: ? ?Bathing ? Bathing activity did not occur:  Refused (will do tomorrow) ?Body parts bathed by patient: Right arm, Left arm, Chest, Abdomen, Front perineal area, Buttocks, Right upper leg, Left upper leg, Right lower leg, Left lower leg, Face  ?   ?  ?  ?Bathing assist Assist Level: Minimal Assistance - Patient > 75% ?  ?  ?Upper Body Dressing/Undressing ?Upper body dressing Upper body dressing/undressing activity did not occur (including orthotics): Refused ?What is the patient wearing?: Pull over shirt ?   ?Upper body assist Assist Level: Supervision/Verbal cueing ?   ?Lower Body Dressing/Undressing ?Lower body dressing ? ? ? Lower body dressing activity did not occur: Refused ?What is the patient wearing?: Pants, Underwear/pull up ? ?  ? ?Lower body assist Assist for lower body dressing: Minimal Assistance - Patient > 75% ?   ? ?Toileting ?Toileting    ?Toileting assist Assist for toileting: Minimal Assistance - Patient > 75% ?  ?  ?Transfers ?Chair/bed transfer ? ?Transfers assist ? Chair/bed transfer activity did not occur: N/A ? ?Chair/bed transfer assist level: Minimal Assistance - Patient > 75% ?Chair/bed transfer assistive device: Walker ?  ?Locomotion ?Ambulation ? ? ?Ambulation assist ? ?   ? ?Assist level: Minimal Assistance - Patient > 75% ?Assistive device: Walker-rolling ?Max distance: >175f  ? ?Walk 10 feet activity ? ? ?Assist ?   ? ?Assist level: Contact Guard/Touching assist ?  Assistive device: Walker-rolling  ? ?Walk 50 feet activity ? ? ?Assist   ? ?Assist level: Minimal Assistance - Patient > 75% ?Assistive device: Walker-rolling  ? ? ?Walk 150 feet activity ? ? ?Assist   ? ?Assist level: Minimal Assistance - Patient > 75% ?Assistive device: Walker-rolling ?  ? ?Walk 10 feet on uneven surface  ?activity ? ? ?Assist   ? ? ?Assist level: Minimal Assistance - Patient > 75% ?Assistive device: Walker-rolling  ? ?Wheelchair ? ? ? ? ?Assist Is the patient using a wheelchair?: Yes ?Type of Wheelchair: Manual ?  ? ?Wheelchair assist level:  Supervision/Verbal cueing ?Max wheelchair distance: >200 ft  ? ? ?Wheelchair 50 feet with 2 turns activity ? ? ? ?Assist ? ?  ?  ? ? ?Assist Level: Supervision/Verbal cueing  ? ?Wheelchair 150 feet activity  ? ? ? ?Assist ?   ? ? ?Assist Level: Supervision/Verbal cueing  ? ?Blood pressure 137/73, pulse 86, temperature 98 ?F (36.7 ?C), temperature source Oral, resp. rate 16, height '5\' 9"'$  (1.753 m), weight 96 kg, SpO2 98 %. ? ?Medical Problem List and Plan: ?1. Functional deficits secondary to right sided ischemic stroke and debility from recent Covid 19 infection ?            -patient may  shower ?            -ELOS/Goals: 7-10d Mod I ? -Continue CIR ?2.  Antithrombotics: ?-DVT/anticoagulation:  Pharmaceutical: Other (comment)Eliquis ?            -antiplatelet therapy: none ?3. Pain: continue Tylenol, Tramadol prn ?4. Mood: LCSW to evaluate and provide emotional support ?            -antipsychotic agents: n/a ?5. Neuropsych: This patient is capable of making decisions on his own behalf. ?6. Skin/Wound Care: Routine skin care checks ?7. Fluids/Electrolytes/Nutrition: encourage PO   ?8: Hypertension: continue Imdur ? 4/7 bp controlled ?9: Chronic diastolic HF: euvolemic; continue to monitor ?            --need daily weights ?            --continue Lasix 40 mg daily and monitor K+ ? 4/9: weight steadily decreasing ?  ?Filed Weights  ? 10/23/21 1356 10/26/21 1400 10/27/21 0418  ?Weight: 97 kg (!) 210.1 kg 96 kg  ?  ?10: Hyperlipidemia: continue Zetia (on Praluent at home) ?11: CAD: s/p CABG 2010; left heart cath 01/2021 ?--CP yesterday, 4/4: EKG and troponin negative ?            --NTG as needed ?12: Atrial fibrillation: continue Eliquis, Pacerone ?13: COPD: Continue Dulera ?            --albuterol nebs as needed ? -encouraged IS, OOB ?14: GERD: ?15: OSA: continue CPAP ?  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            --follow-up with Dr. 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  Pt with bilateral sustained clonus at the ankles 3+ DTR throughout ? 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  ? -discussed with patient that cardiology has said his pacemaker is MRI compatible- will be able to obtain Monday as currently shortstaffed ?18. Anxiety with MRI: Valium '5mg'$  ordered for 30 minutes prior to MRI, discussed with nursing.  ? ?LOS: ?4 days ?A  FACE TO FACE EVALUATION WAS PERFORMED ? ?[name] ?10/27/2021, 9:08 AM  ? ?  ?

## 2021-10-28 ENCOUNTER — Other Ambulatory Visit (HOSPITAL_COMMUNITY): Payer: Self-pay

## 2021-10-28 ENCOUNTER — Inpatient Hospital Stay (HOSPITAL_COMMUNITY): Payer: Medicare HMO

## 2021-10-28 LAB — BASIC METABOLIC PANEL
Anion gap: 8 (ref 5–15)
BUN: 15 mg/dL (ref 8–23)
CO2: 27 mmol/L (ref 22–32)
Calcium: 8.4 mg/dL — ABNORMAL LOW (ref 8.9–10.3)
Chloride: 101 mmol/L (ref 98–111)
Creatinine, Ser: 0.94 mg/dL (ref 0.61–1.24)
GFR, Estimated: >60 mL/min (ref >60)
Glucose, Bld: 205 mg/dL — ABNORMAL HIGH (ref 70–99)
Potassium: 3.9 mmol/L (ref 3.5–5.1)
Sodium: 136 mmol/L (ref 135–145)

## 2021-10-28 LAB — CBC
HCT: 30.8 % — ABNORMAL LOW (ref 39.0–52.0)
Hemoglobin: 9.6 g/dL — ABNORMAL LOW (ref 13.0–17.0)
MCH: 32.8 pg (ref 26.0–34.0)
MCHC: 31.2 g/dL (ref 30.0–36.0)
MCV: 105.1 fL — ABNORMAL HIGH (ref 80.0–100.0)
Platelets: 229 10*3/uL (ref 150–400)
RBC: 2.93 MIL/uL — ABNORMAL LOW (ref 4.22–5.81)
RDW: 17.1 % — ABNORMAL HIGH (ref 11.5–15.5)
WBC: 8.6 10*3/uL (ref 4.0–10.5)
nRBC: 0 % (ref 0.0–0.2)

## 2021-10-28 IMAGING — MR MR HEAD W/O CM
9 of 10 series · 38 of 48 positions shown · non-contrast
Comparison: CT/CTA head and neck [DATE]

CLINICAL DATA: Lower extremity weakness, stroke suspected

EXAM:
MRI HEAD WITHOUT CONTRAST
TECHNIQUE: Multiplanar, multiecho pulse sequences of the brain and surrounding
structures were obtained without intravenous contrast.

[Series 3: T1 · sagittal · 5.0mm · 0.47mm/px · 3 of 26 slices shown (1 of 2)]
[im 1/26]
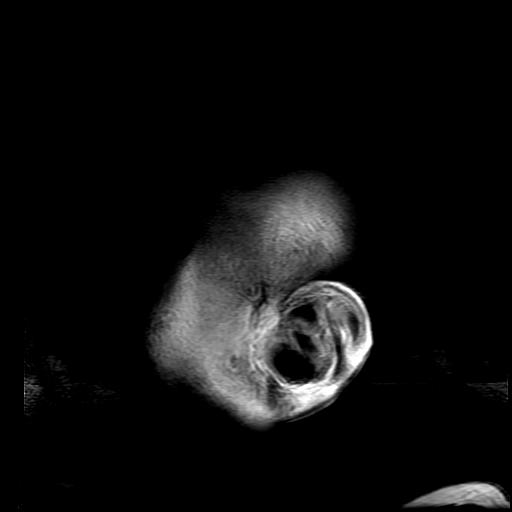
[im 13/26]
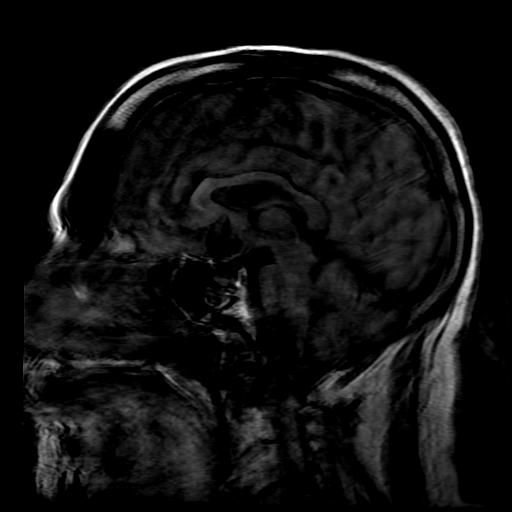
[im 26/26]
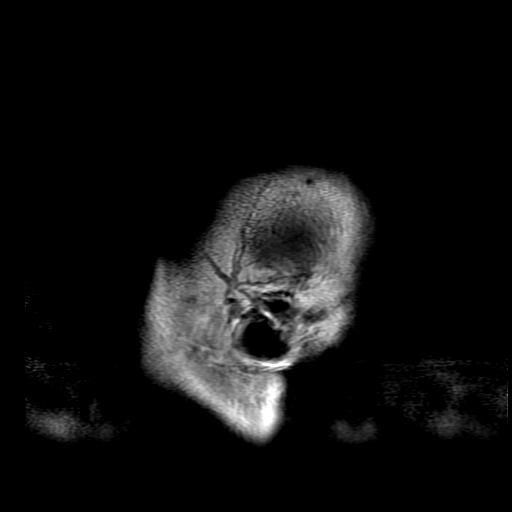

[Series 4: DWI · axial · 3.0mm · 1.09mm/px · z∈[-77,+83]mm · 11 of 112 slices shown (1 of 4)]
[im 1/112]
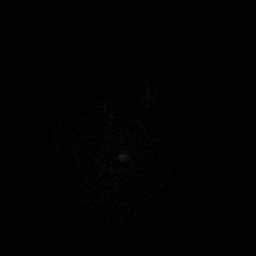
[im 12/112]
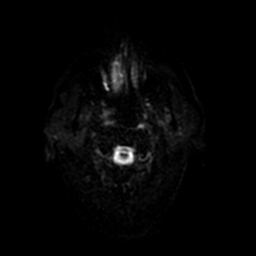
[im 23/112]
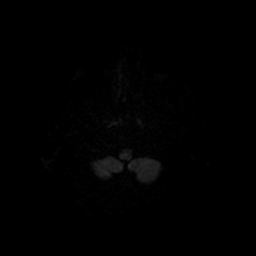
[im 34/112]
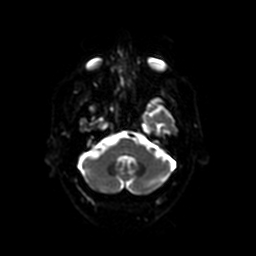
[im 45/112]
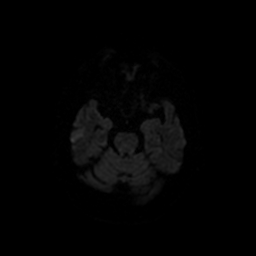
[im 56/112]
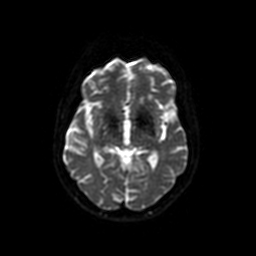
[im 67/112]
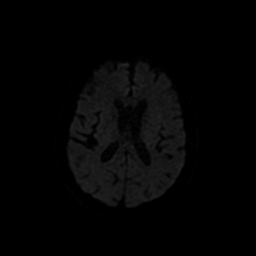
[im 78/112]
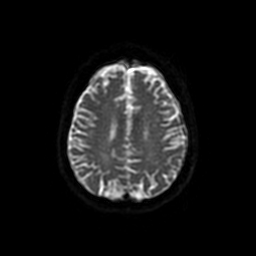
[im 89/112]
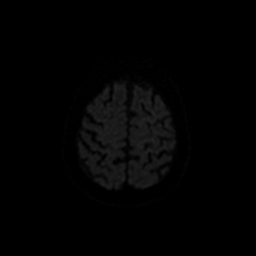
[im 100/112]
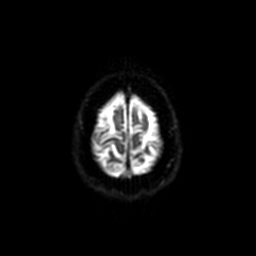
[im 112/112]
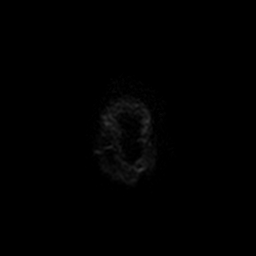

[Series 5: DWI · coronal · 5.0mm · 1.09mm/px · 7 of 80 slices shown (2 of 4)]
[im 1/80]
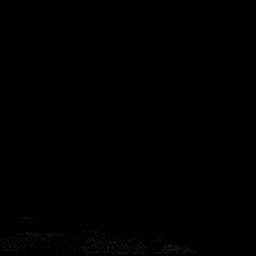
[im 14/80]
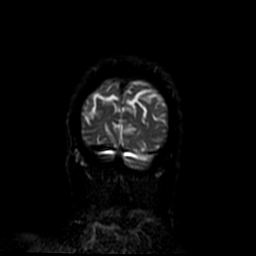
[im 27/80]
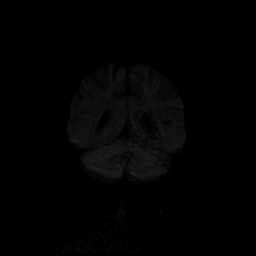
[im 40/80]
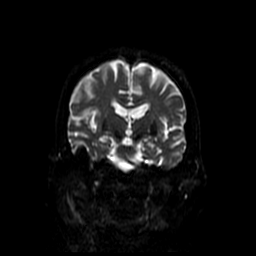
[im 53/80]
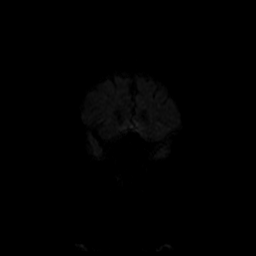
[im 66/80]
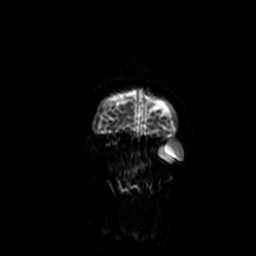
[im 80/80]
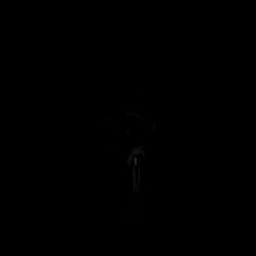

[Series 7: FLAIR · axial · 5.0mm · 0.47mm/px · z∈[-57,+90]mm · 2 of 26 slices shown]
[im 1/26]
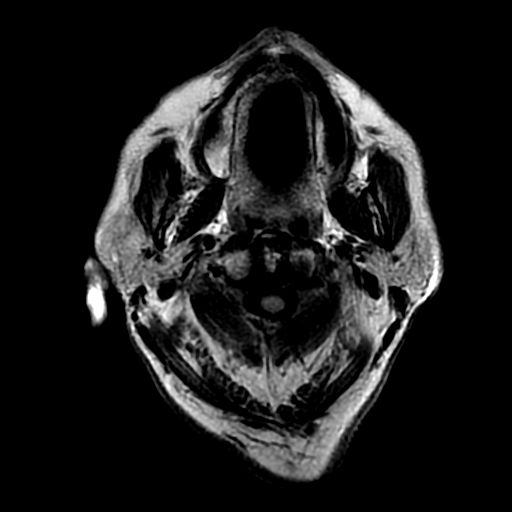
[im 26/26]
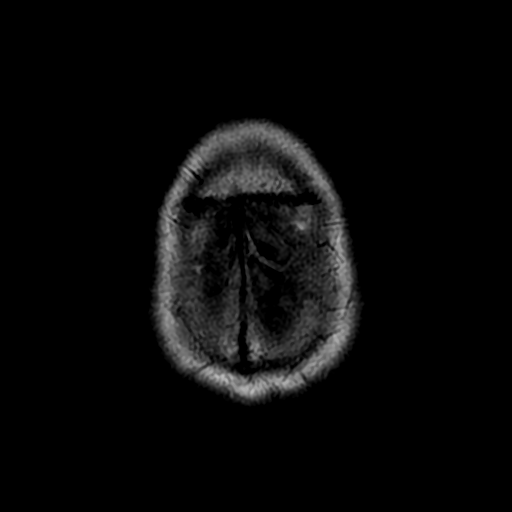

[Series 8: T2 · axial · 5.0mm · 0.45mm/px · z∈[-75,+72]mm · 2 of 26 slices shown (1 of 2)]
[im 1/26]
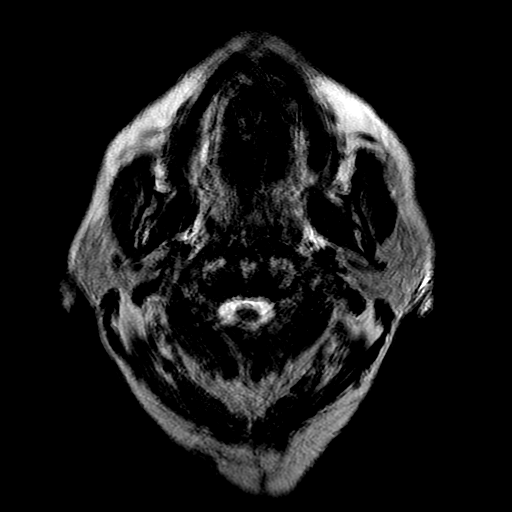
[im 26/26]
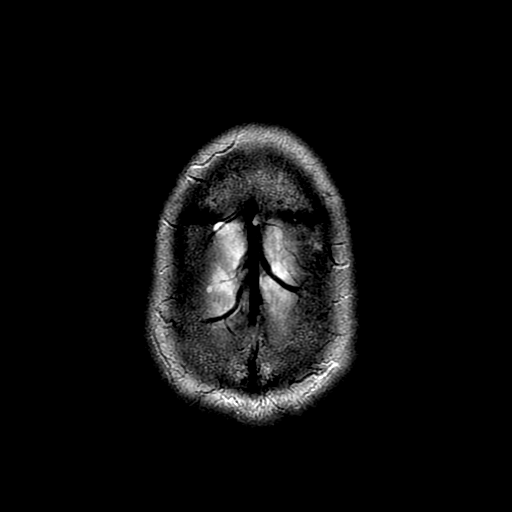

[Series 9: T2 · coronal · 5.0mm · 0.43mm/px · 3 of 32 slices shown (2 of 2)]
[im 1/32]
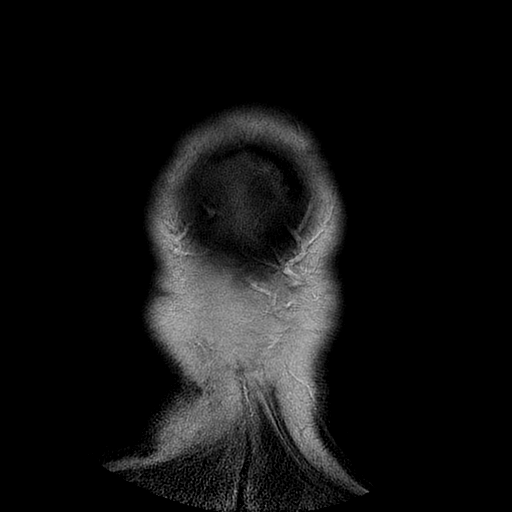
[im 16/32]
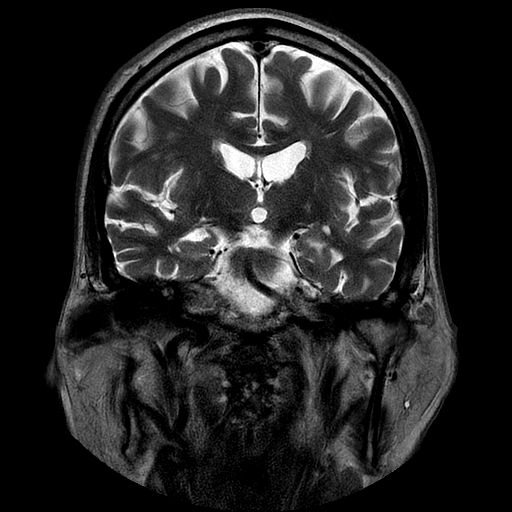
[im 32/32]
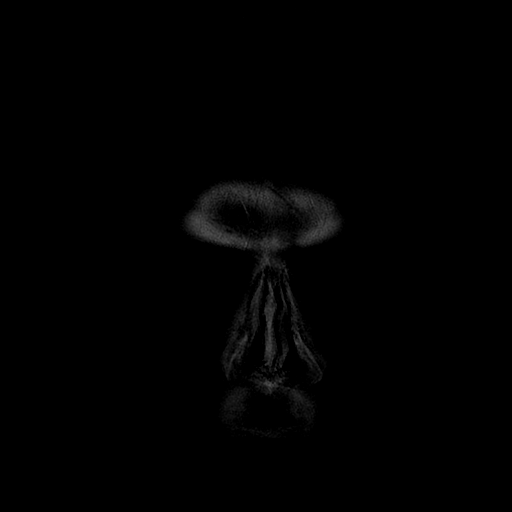

[Series 10: T1 · axial · 3.0mm · 0.47mm/px · 1 of 100 slices shown (2 of 2)]
[im 1/100]
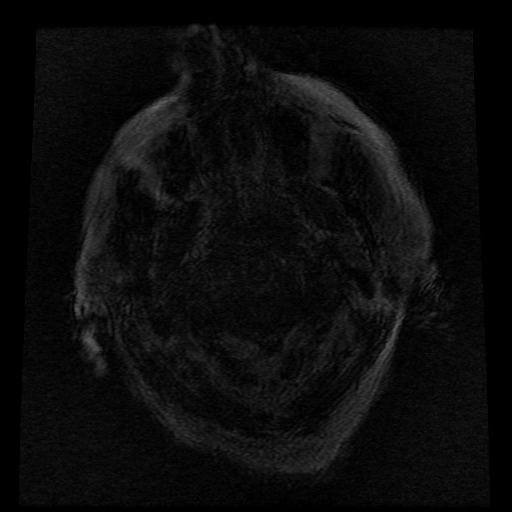

[Series 400: DWI · axial · 3.0mm · 1.09mm/px · z∈[-77,+83]mm · 5 of 56 slices shown (3 of 4)]
[im 1/56]
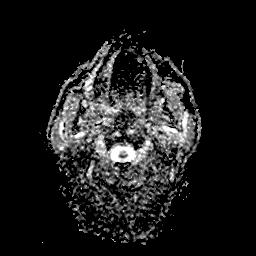
[im 14/56]
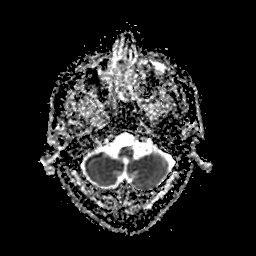
[im 28/56]
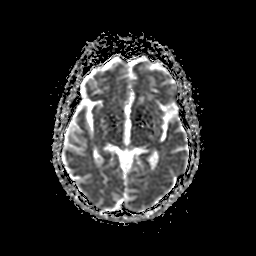
[im 42/56]
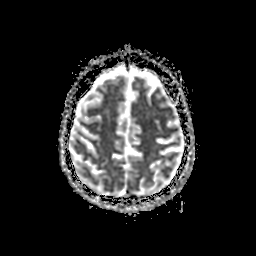
[im 56/56]
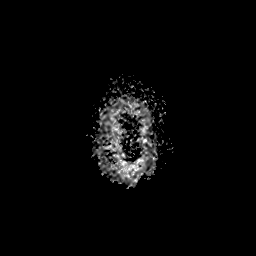

[Series 500: DWI · coronal · 5.0mm · 1.09mm/px · 4 of 40 slices shown (4 of 4)]
[im 1/40]
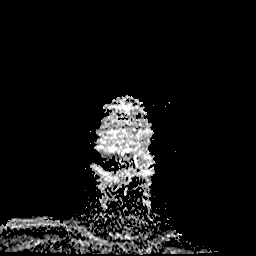
[im 14/40]
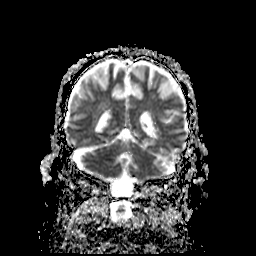
[im 27/40]
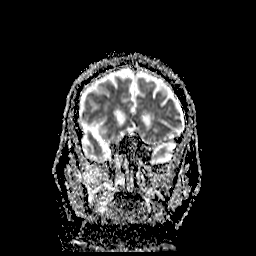
[im 40/40]
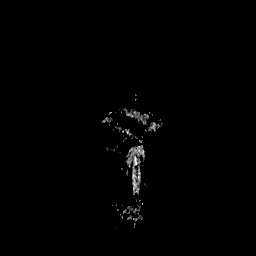

[38 of 48 positions shown; findings below may reference images not displayed]

FINDINGS: Brain: There is no evidence of acute intracranial hemorrhage,
extra-axial fluid collection, or acute infarct.

Background parenchymal volume is normal for age. The ventricles are
normal in size. Incidental note is made of a cavum septum pellucidum
et vergae. Patchy FLAIR signal abnormality in the subcortical and
periventricular white matter likely reflects sequela of mild chronic
white matter microangiopathy.

There is no suspicious parenchymal signal abnormality. There is no
mass lesion. There is no mass effect or midline shift.

Vascular: Normal flow voids.

Skull and upper cervical spine: Normal marrow signal.

Sinuses/Orbits: There is mild mucosal thickening in the paranasal
sinuses. The globes and orbits are unremarkable.

Other: None.
IMPRESSION: No acute intracranial pathology.  Unremarkable for age brain MRI.

## 2021-10-28 MED ORDER — LORAZEPAM 2 MG/ML IJ SOLN
2.0000 mg | Freq: Once | INTRAMUSCULAR | Status: AC
  Administered 2021-10-28: 2 mg via INTRAMUSCULAR
  Filled 2021-10-28: qty 1

## 2021-10-28 NOTE — Progress Notes (Signed)
Patient to MRI from [facility] [address] for dual study scan. Patient has medtronic device. CLE sent. Orders received for DOO 100. Will re-program once scan is completed.  ?

## 2021-10-28 NOTE — Progress Notes (Signed)
Occupational Therapy Session Note ? ?Patient Details  ?Name: [name] ?MRN: [id number] ?Date of Birth: Nov 13, 1945 ? ?Today's Date: 10/28/2021 ?OT Group Time: 1100-1200 ?OT Group Time Calculation (min): 60 min ? ? ?Short Term Goals: ?Week 1:  OT Short Term Goal 1 (Week 1): Pt will complete full ADL with no more than min cuing for safety awareness ?OT Short Term Goal 2 (Week 1): Pt will improve LUE function on [facility] assessments by 20% ?OT Short Term Goal 3 (Week 1): Pt will completes functional transfers with LRAD and supervision ? ?Skilled Therapeutic Interventions/Progress Updates:  ?Pt participated in group session with a focus on BUE strength, standing balance and endurance to facilitate improved activity tolerance and strength for higher level BADLs and functional mobility tasks. Pt first in engaged in therapeutic activity of playing corn hole in teams, pt able to ambulate to starting line with Rw and supervision. Pt tossed bags with RUE and overall supervision. Good awareness noted to RW mgmt and safety awareness.   ?Pt engaged in seated therapeutic activity game where pts were instructed to roll large dice to see what number they rolled, once a number was determined each number correlated to an UB exercise and a number of reps, exercises included bicep curls, tricep extensions, upright rows, flys, chest presses and punches. Repetitions ranged from 5-20. ?Pt chose to use 3 lb weights during session. Education provided during activity of various modifications for all exercises. Education provided on the importance of deep breathing as well as determining each pts activity tolerance. ?Ended session with guide deep breathing for 1 min 30 secs. Discussed benefits of deep breathing to manage stress and pain. Pt ambulated back to room with Rw and supervision. Pt left supine in bed with bed alarm activated and all needs within reach.  ? ?Therapy Documentation ?Precautions:  ?Precautions ?Precautions:  Fall ?Restrictions ?Weight Bearing Restrictions: No ? ?  ?Pain: no pain reported during session  ? ?Therapy/Group: Group Therapy ? ?Precious Haws ?10/28/2021, 3:37 PM ?

## 2021-10-28 NOTE — TOC Benefit Eligibility Note (Signed)
Patient Advocate Encounter ? ?Insurance verification completed.   ? ?The patient is currently admitted and upon discharge could be taking Eliquis 5 mg. ? ?The current 30 day co-pay is, $151.46 due to being in Coverage Gap (donut hole).  ? ?The patient is insured through [employer] Part D  ? ? ? ?Lyndel Safe, CPhT ?Pharmacy Patient Advocate Specialist ?[place] Patient Advocate Team ?Direct Number: [phone]  Fax: [phone] ? ? ? ? ? ?  ?

## 2021-10-28 NOTE — Progress Notes (Signed)
Ativan administered, pt tolerating medication and awaiting MRI. ?

## 2021-10-28 NOTE — Progress Notes (Signed)
Patient on CPAP. 8.0 cm H20. 21% FIO2. Resting well at this time. ?

## 2021-10-28 NOTE — Progress Notes (Signed)
Physical Therapy Session Note ? ?Patient Details  ?Name: [name] ?MRN: [id number] ?Date of Birth: Sep 09, 1945 ? ?Today's Date: 10/28/2021 ?PT Individual Time: 0800-0900 ?PT Individual Time Calculation (min): 60 min  ? ?Short Term Goals: ?Week 1:  PT Short Term Goal 1 (Week 1): Patient will perform basic transfers with supervision consistently. ?PT Short Term Goal 2 (Week 1): Patient will ambulate with CGA >150 ft using LRAD. ?PT Short Term Goal 3 (Week 1): Patient will perform a standardized balance assessment. ? ?Skilled Therapeutic Interventions/Progress Updates:  ?   ?Patient in bed upon PT arrival. Patient alert and agreeable to PT session. Patient reported 8/10 chronic B leg pain during session, RN made aware. PT provided repositioning, rest breaks, and distraction as pain interventions throughout session.  ? ?MD rounded at beginning of session, discussed notes from neurosurgery about obtaining MRI today.  ? ?Therapeutic Activity: ?Bed Mobility: Patient performed supine to/from sit with supervision-mod I and increased time in a flat bed with use of bed rail.  ?Transfers: Patient performed sit to/from stand with supervision with and without RW throughout session. Provided verbal cues for forward weight shift. ? ?Gait Training:  ?Patient ambulated >200 feet using RW with supervision. Ambulated with decreased gait speed, decreased step length and height, and mild forward trunk lean. Provided verbal cues for erect posture, increased gait speed, and safe proximity to RW. ?6 Min Walk Test:  ?Instructed patient to ambulate as quickly and as safely as possible for 6 minutes using LRAD. Patient was allowed to take standing rest breaks without stopping the test, but if the patient required a sitting rest break the clock would be stopped and the test would be over.  ?Results: 606 feet (185 meters, Avg speed 0.5 m/s) using a RW with close supervision. Results indicate that the patient has reduced endurance with  ambulation compared to age matched norms.  ?Age Matched Norms: 37-79 yo M: 527 meters ?MDC: 58.21 meters (190.98 feet) or 50 meters ?(ANPTA Core Set of Outcome Measures for Adults with Neurologic Conditions, 2018) ? ?Neuromuscular Re-ed: ?Berg Balance Test ?Sit to Stand: Able to stand without using hands and stabilize independently ?Standing Unsupported: Able to stand safely 2 minutes ?Sitting with Back Unsupported but Feet Supported on Floor or Stool: Able to sit safely and securely 2 minutes ?Stand to Sit: Sits safely with minimal use of hands ?Transfers: Able to transfer safely, minor use of hands ?Standing Unsupported with Eyes Closed: Able to stand 10 seconds safely ?Standing Ubsupported with Feet Together: Able to place feet together independently and stand 1 minute safely ?From Standing, Reach Forward with Outstretched Arm: Can reach forward >12 cm safely (5") ?From Standing Position, Pick up Object from Floor: Able to pick up shoe, needs supervision ?From Standing Position, Turn to Look Behind Over each Shoulder: Turn sideways only but maintains balance ?Turn 360 Degrees: Needs close supervision or verbal cueing ?Standing Unsupported, Alternately Place Feet on Step/Stool: Able to complete >2 steps/needs minimal assist ?Standing Unsupported, One Foot in Front: Able to plae foot ahead of the other independently and hold 30 seconds (both sides) ?Standing on One Leg: Able to lift leg independently and hold 5-10 seconds (5 sec on L 11 sec on R) ?Total Score: 44/56 ?Patient demonstrated increased fall risk noted by score of 44/56 on the [facility] Scale.  ?<45/56 = fall risk, <42/56 = predictive of recurrent falls, <40/56 = 100% fall risk  ?>41 = independent, 21-40 = assistive device, 0-20 = wheelchair level  ?[place]  6.9 (4 pts 45-56, 5 pts 35-44, 7 pts 25-34) ?(ANPTA Core Set of Outcome Measures for Adults with Neurologic Conditions, 2018) ? ?Review outcome measure results with patient and discussed fall risk and  use of RW. Patient stated understanding.  ? ?Patient sitting EOB  at end of session with breaks locked, bed alarm set, and all needs within reach.  ? ?Therapy Documentation ?Precautions:  ?Precautions ?Precautions: Fall ?Restrictions ?Weight Bearing Restrictions: No ? ? ? ?Therapy/Group: Individual Therapy ? ?Doreene Burke PT, DPT ? ?10/28/2021, 12:10 PM  ?

## 2021-10-28 NOTE — Progress Notes (Signed)
Occupational Therapy Session Note ? ?Patient Details  ?Name: NED KAKAR ?MRN: [id number] ?Date of Birth: 29-Jul-1945 ? ?Today's Date: 10/28/2021 ?OT Individual Time: 6384-6659 ?OT Individual Time Calculation (min): 11 min  ? ? ?Short Term Goals: ?Week 1:  OT Short Term Goal 1 (Week 1): Pt will complete full ADL with no more than min cuing for safety awareness ?OT Short Term Goal 2 (Week 1): Pt will improve LUE function on [facility] assessments by 20% ?OT Short Term Goal 3 (Week 1): Pt will completes functional transfers with LRAD and supervision ? ?Skilled Therapeutic Interventions/Progress Updates:  ?  Pt sitting EOB with no c/o pain. He reported MRI transport was on their was but he was willing to do as much activity as able within time constraints. Discussed home hobbies/activities and [place] needed to complete these. He completed 1x8 blocked practice sit <> stands from EOB with a 5lb dumbbell, incorporating an overhead reach to challenge UE strengthening/endurance and generalized endurance training. MRI transport arrived and pt returned to supine, left in their care.  ?15 min missed.  ? ?Therapy Documentation ?Precautions:  ?Precautions ?Precautions: Fall ?Restrictions ?Weight Bearing Restrictions: No ? ?Therapy/Group: Individual Therapy ? ?Curtis Sites ?10/28/2021, 6:35 AM ?

## 2021-10-28 NOTE — Discharge Summary (Signed)
Physician Discharge Summary  ?Patient ID: ?[name] ?MRN: [id number] ?DOB/AGE: 08-19-1945 76 y.o. ? ?Admit date: 10/23/2021 ?Discharge date: 11/01/2021 ? ?Discharge Diagnoses:  ?Principal Problem: ?  Stroke (cerebrum) ([place]) ?Active problems: ?Functional deficits secondary to ischemic stroke ?Hypertension ?Chronic diastolic heart failure ?Hyperlipidemia ?History of coronary artery disease ?History of atrial fibrillation ?COPD ?Gastroesophageal reflux disease ?Obstructive sleep apnea ?Posttraumatic stress disorder ?Chronic lymphocytic leukemia ?Sinus node dysfunction status post pacemaker ? ? ?Discharged Condition: stable ? ?Significant Diagnostic Studies: ?DG Chest 2 View ? ?Result Date: 10/18/2021 ?CLINICAL DATA:  Shortness of breath EXAM: CHEST - 2 VIEW COMPARISON:  Chest x-ray 09/13/2021 FINDINGS: Heart size and mediastinum are stable and within normal limits. Cardiac surgical changes and median sternotomy wires. Left-sided cardiac device. Linear opacities again seen in the left lower lung zone likely representing scarring/chronic subsegmental atelectasis. No new consolidation identified. No pleural effusion or pneumothorax. IMPRESSION: Stable appearing chronic changes with no acute process identified. Electronically Signed   By: Ofilia Neas M.D.   On: 10/18/2021 14:04  ? ?CT HEAD WO CONTRAST ? ?Result Date: 10/18/2021 ?CLINICAL DATA:  Shortness of breath, generalized weakness EXAM: CT HEAD WITHOUT CONTRAST TECHNIQUE: Contiguous axial images were obtained from the base of the skull through the vertex without intravenous contrast. RADIATION DOSE REDUCTION: This exam was performed according to the departmental dose-optimization program which includes automated exposure control, adjustment of the mA and/or kV according to patient size and/or use of iterative reconstruction technique. COMPARISON:  09/13/2021 FINDINGS: Brain: No acute intracranial findings are seen. Ventricles are not dilated. Cavum septum  pellucidum and cavum septum vergae are seen. There is no shift of midline structures. Cortical sulci are prominent. Vascular: There are scattered arterial calcifications. Skull: Unremarkable. Sinuses/Orbits: There is mucosal thickening in the ethmoid, maxillary and sphenoid sinuses. Other: None IMPRESSION: No acute intracranial findings are seen in noncontrast CT brain. Atrophy. Chronic sinusitis. Electronically Signed   By: Elmer Picker M.D.   On: 10/18/2021 13:48  ? ?CT CERVICAL SPINE WO CONTRAST ? ?Result Date: 10/24/2021 ?CLINICAL DATA:  Cervical radiculopathy. No red flag symptoms. Spasticity in the lower more than upper extremities EXAM: CT CERVICAL SPINE WITHOUT CONTRAST TECHNIQUE: Multidetector CT imaging of the cervical spine was performed without intravenous contrast. Multiplanar CT image reconstructions were also generated. RADIATION DOSE REDUCTION: This exam was performed according to the departmental dose-optimization program which includes automated exposure control, adjustment of the mA and/or kV according to patient size and/or use of iterative reconstruction technique. COMPARISON:  CTA of the neck 11/02/2015 FINDINGS: Alignment: Unremarkable Skull base and vertebrae: No acute fracture. No primary bone lesion or focal pathologic process. Soft tissues and spinal canal: No visible canal collection. Apparent epidural fat displacement at C7 on sagittal images is attributed to artifact based on source images. Disc levels: C1-2: Atlantodental and facet spurring.  No visible impingement C2-3: Uncovertebral and facet spurring with moderate left foraminal stenosis C3-4: Disc narrowing with partially calcified central protrusion. Bulky ventral spurring. Mild bilateral facet spurring. Spinal stenosis with implied cord impingement. Biforaminal impingement C4-5: Disc collapse with endplate and uncovertebral ridging causing biforaminal impingement. A disc osteophyte complex contacts the left ventral cord.  Intervertebral ankylosis. C5-6: Disc collapse with endplate and uncovertebral ridging causing moderate bilateral foraminal stenosis. Posterior ridging likely contacts the ventral cord. Intervertebral ankylosis. C6-7: Disc narrowing and bulging with extensive ridging and advanced right foraminal impingement. Partially calcified central protrusion likely impinges on the cord C7-T1:Facet and spondylitic spurring with right more than left foraminal impingement. Upper  chest: No acute finding IMPRESSION: 1. 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 2. Multilevel foraminal impingement which is most advanced bilaterally at C3-4, bilaterally at C4-5, and on the right at C6-7. 3. 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 Electronically Signed   By: Jorje Guild M.D.   On: 10/24/2021 06:28  ? ?MR BRAIN WO CONTRAST ? ?Result Date: 10/28/2021 ?CLINICAL DATA:  Lower extremity weakness, stroke suspected EXAM: MRI HEAD WITHOUT CONTRAST TECHNIQUE: Multiplanar, multiecho pulse sequences of the brain and surrounding structures were obtained without intravenous contrast. COMPARISON:  CT/CTA head and neck 10/18/2021 FINDINGS: Brain: There is no evidence of acute intracranial hemorrhage, extra-axial fluid collection, or acute infarct. Background parenchymal volume is normal for age. The ventricles are normal in size. 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 Patchy FLAIR signal abnormality in the subcortical and periventricular white matter likely reflects sequela of mild chronic white matter microangiopathy. There is no suspicious parenchymal signal abnormality. There is no mass lesion. There is no mass effect or midline shift. Vascular: Normal flow voids. Skull and upper cervical spine: Normal marrow signal. Sinuses/Orbits: There is mild mucosal thickening in the paranasal sinuses. The globes and orbits are unremarkable. Other: None. IMPRESSION: No acute  intracranial pathology.  Unremarkable for age brain MRI. Electronically Signed   By: Valetta Mole M.D.   On: 10/28/2021 16:19  ? ?MR CERVICAL SPINE WO CONTRAST ? ?Result Date: 10/30/2021 ?CLINICAL DATA:  Lower extremity weakness.  Cervical spinal stenosis. EXAM: MRI CERVICAL SPINE WITHOUT CONTRAST TECHNIQUE: Multiplanar, multisequence MR imaging of the cervical spine was performed. No intravenous contrast was administered. COMPARISON:  CT cervical spine 10/24/2021 FINDINGS: Alignment: Image quality degraded by moderate motion. The patient was sedated for the study. Mild retrolisthesis C3-4 and C4-5.  Mild anterolisthesis C7-T1 Vertebrae: Negative for fracture or mass Cord: Spinal cord evaluation limited by significant motion. No cord signal abnormality identified Posterior Fossa, vertebral arteries, paraspinal tissues: Negative Disc levels: C2-3: Moderate to severe left foraminal encroachment due to spurring. Spinal canal adequate in size C3-4: Disc degeneration with diffuse uncinate spurring. Cord flattening with moderate spinal stenosis. Moderate to severe foraminal encroachment bilaterally. C4-5: Disc degeneration with diffuse uncinate spurring. Mild spinal stenosis. Severe foraminal stenosis bilaterally C5-6: Solid arthrodesis.  Mild foraminal narrowing bilaterally C6-7: Disc degeneration with diffuse uncinate spurring. Mild spinal stenosis. Moderate foraminal stenosis bilaterally C7-T1: Disc and facet degeneration. Mild to moderate spinal stenosis. 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 Electronically Signed   By: Franchot Gallo M.D.   On: 10/30/2021 14:38  ? ?CUP PACEART REMOTE DEVICE CHECK ? ?Result Date: 10/29/2021 ?Scheduled remote reviewed. Normal device function.  Next remote 91 days- JJB ? ?CT ANGIO HEAD NECK  W WO CM (CODE STROKE) ? ?Result Date: 10/18/2021 ?CLINICAL DATA:  Stroke suspected EXAM: CT ANGIOGRAPHY HEAD AND NECK TECHNIQUE: Multidetector CT imaging of the head and neck was performed using the standard protocol duri

## 2021-10-28 NOTE — Progress Notes (Addendum)
Speech Language Pathology Daily Session Note ? ?Patient Details  ?Name: [name] ?MRN: [id number] ?Date of Birth: 1946/06/05 ? ?Today's Date: 10/28/2021 ?SLP Individual Time: 0930-1000 ?SLP Individual Time Calculation (min): 30 min ? ?Short Term Goals: ?Week 1: SLP Short Term Goal 1 (Week 1): Patient will recall new, daily information with supervision level verbal cues for use of compensatory strategies. ?SLP Short Term Goal 2 (Week 1): Patient will demonstrate functional problem solving for mildly complex tasks with Mod I. ?SLP Short Term Goal 3 (Week 1): Patient will demonstrate divided attention with high-level functional tasks for 30 mintues with supervision level verbal cues for redirection. ? ?Skilled Therapeutic Interventions: ?Pt seen this date for skilled ST intervention targeting cognitive-linguistic goals outlined above. Pt encountered awake/alert and upright in bed. No c/o pain. Agreeable to ST intervention at bedside. Pleasant and cooperative throughout. ? ?SLP facilitated today's session by providing skilled compensatory strategy training r/t recall (e.g. WARM strategies). SLP provided functional examples re: use of each memory strategy (e.g. recalling scheduled appointments, family events, medications, and daily activities). On probe, pt recalled general purpose of ~50% of his medications independently; improved to 100% accuracy following Max A faded to Min verbal A. Pt states that he was independently filling a pill organizer prior to admission. Next session, plan to probe recall of medications following education today to include making a medication list, and assess performance with pill organizer. Pt appreciative of education. ? ?Session concluded with pt in bed, bed alarm on, call bell within reach, and all immediate needs met. Continue per current ST POC. ? ?Pain ?Denies pain; NAD ? ?Therapy/Group: Individual Therapy ? ?Emmie Frakes A Nil Xiong ?10/28/2021, 12:32 PM ?

## 2021-10-28 NOTE — Progress Notes (Signed)
Pt in holding room for MRI, reports that was given valium about an hour ago and is not feeling any type of relaxation or calm. Pt has no iv access. Called PA to notify of situation. Will contact MD to see if anything can be done now or possible need to return to floor to evaluate. Pt with PTSD and reports that CT made him feel like was in a hole. PA called back, will see if pt wants IM Ativan '2mg'$  or gain access via IV for same amt.   ?

## 2021-10-28 NOTE — Progress Notes (Signed)
?                                                       PROGRESS NOTE ? ? ?Subjective/Complaints: ?Pt reports has PTSD and wants anxiety meds for MRI.  ? Otherwise, no issues.  ? ?ROS:  ?Pt denies SOB, abd pain, CP, N/V/C/D, and vision changes ? ? ?Objective: ?  ?No results found. ?Recent Labs  ?  10/28/21 ?0737  ?WBC 8.6  ?HGB 9.6*  ?HCT 30.8*  ?PLT 229  ? ? ?Recent Labs  ?  10/28/21 ?0737  ?NA 136  ?K 3.9  ?CL 101  ?CO2 27  ?GLUCOSE 205*  ?BUN 15  ?CREATININE 0.94  ?CALCIUM 8.4*  ? ? ? ?Intake/Output Summary (Last 24 hours) at 10/28/2021 1438 ?Last data filed at 10/28/2021 0605 ?Gross per 24 hour  ?Intake 476 ml  ?Output 750 ml  ?Net -274 ml  ?  ? ?  ? ?Physical Exam: ?Vital Signs ?Blood pressure 126/61, pulse 75, temperature 97.8 ?F (36.6 ?C), resp. rate 18, height '5\' 9"'$  (1.753 m), weight 95.1 kg, SpO2 95 %. ? ? ?General: awake, alert, appropriate, sitting up in bed; NAD ?HENT: conjugate gaze; oropharynx moist ?CV: regular rate; no JVD ?Pulmonary: CTA B/L; no W/R/R- good air movement ?GI: soft, NT, ND, (+)BS ?Psychiatric: appropriate ?Neurological: Ox3 ? ? ?Skin: Clean and intact without signs of breakdown ?Neuro:  LUE 3 to 3+/5. LLE 3/5 prox to 4/5 distally--inconsistent effort. Sensory 1/2 LUE and LLE. DTR's 3+ in LE's and at least 2+ UE's.  Clonus in both ankles.-- ?Musculoskeletal: generalized joint tenderness with palpation.--no changes   ? ? ?Assessment/Plan: ?1. Functional deficits which require 3+ hours per day of interdisciplinary therapy in a comprehensive inpatient rehab setting. ?Physiatrist is providing close team supervision and 24 hour management of active medical problems listed below. ?Physiatrist and rehab team continue to assess barriers to discharge/monitor patient progress toward functional and medical goals ? ?Care Tool: ? ?Bathing ? Bathing activity did not occur: Refused (will do tomorrow) ?Body parts bathed by patient: Right arm, Left arm, Chest, Abdomen, Front perineal area, Buttocks,  Right upper leg, Left upper leg, Right lower leg, Left lower leg, Face  ?   ?  ?  ?Bathing assist Assist Level: Minimal Assistance - Patient > 75% ?  ?  ?Upper Body Dressing/Undressing ?Upper body dressing Upper body dressing/undressing activity did not occur (including orthotics): Refused ?What is the patient wearing?: Pull over shirt ?   ?Upper body assist Assist Level: Supervision/Verbal cueing ?   ?Lower Body Dressing/Undressing ?Lower body dressing ? ? ? Lower body dressing activity did not occur: Refused ?What is the patient wearing?: Pants, Underwear/pull up ? ?  ? ?Lower body assist Assist for lower body dressing: Minimal Assistance - Patient > 75% ?   ? ?Toileting ?Toileting    ?Toileting assist Assist for toileting: Minimal Assistance - Patient > 75% ?  ?  ?Transfers ?Chair/bed transfer ? ?Transfers assist ? Chair/bed transfer activity did not occur: N/A ? ?Chair/bed transfer assist level: Minimal Assistance - Patient > 75% ?Chair/bed transfer assistive device: Walker ?  ?Locomotion ?Ambulation ? ? ?Ambulation assist ? ?   ? ?Assist level: Minimal Assistance - Patient > 75% ?Assistive device: Walker-rolling ?Max distance: >130f  ? ?Walk 10 feet activity ? ? ?Assist ?   ? ?  Assist level: Contact Guard/Touching assist ?Assistive device: Walker-rolling  ? ?Walk 50 feet activity ? ? ?Assist   ? ?Assist level: Minimal Assistance - Patient > 75% ?Assistive device: Walker-rolling  ? ? ?Walk 150 feet activity ? ? ?Assist   ? ?Assist level: Minimal Assistance - Patient > 75% ?Assistive device: Walker-rolling ?  ? ?Walk 10 feet on uneven surface  ?activity ? ? ?Assist   ? ? ?Assist level: Minimal Assistance - Patient > 75% ?Assistive device: Walker-rolling  ? ?Wheelchair ? ? ? ? ?Assist Is the patient using a wheelchair?: Yes ?Type of Wheelchair: Manual ?  ? ?Wheelchair assist level: Supervision/Verbal cueing ?Max wheelchair distance: >200 ft  ? ? ?Wheelchair 50 feet with 2 turns activity ? ? ? ?Assist ? ?  ?   ? ? ?Assist Level: Supervision/Verbal cueing  ? ?Wheelchair 150 feet activity  ? ? ? ?Assist ?   ? ? ?Assist Level: Supervision/Verbal cueing  ? ?Blood pressure 126/61, pulse 75, temperature 97.8 ?F (36.6 ?C), resp. rate 18, height '5\' 9"'$  (1.753 m), weight 95.1 kg, SpO2 95 %. ? ?Medical Problem List and Plan: ?1. Functional deficits secondary to right sided ischemic stroke and debility from recent Covid 19 infection ?            -patient may  shower ?            -ELOS/Goals: 7-10d Mod I ? -Continue CIR- PT, OT - MRI pending.  ?2.  Antithrombotics: ?-DVT/anticoagulation:  Pharmaceutical: Other (comment)Eliquis ?            -antiplatelet therapy: none ?3. Pain: continue Tylenol, Tramadol prn ?4. Mood: LCSW to evaluate and provide emotional support ?            -antipsychotic agents: n/a ?5. Neuropsych: This patient is capable of making decisions on his own behalf. ?6. Skin/Wound Care: Routine skin care checks ?7. Fluids/Electrolytes/Nutrition: encourage PO   ?8: Hypertension: continue Imdur ? 4/7 bp controlled ? 4/10- BP controlled- con't regimen ?9: Chronic diastolic HF: euvolemic; continue to monitor ?            --need daily weights ?            --continue Lasix 40 mg daily and monitor K+ ? 4/9: weight steadily decreasing ? 4/10- Weight is 96 kg- so likely 210 kg was filed incorrectly.  ?Filed Weights  ? 10/26/21 1400 10/27/21 0418 10/28/21 0500  ?Weight: (!) 210.1 kg 96 kg 95.1 kg  ?  ?10: Hyperlipidemia: continue Zetia (on Praluent at home) ?11: CAD: s/p CABG 2010; left heart cath 01/2021 ?--CP yesterday, 4/4: EKG and troponin negative ?            --NTG as needed ?12: Atrial fibrillation: continue Eliquis, Pacerone ?13: COPD: Continue Dulera ?            --albuterol nebs as needed ? -encouraged IS, OOB ?14: GERD: ?15: OSA: continue CPAP ?  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            --follow-up with Dr. 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  Pt with bilateral sustained clonus at the ankles 3+ DTR throughout ? 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  ? -discussed with patient that cardiology has said his pacemaker is MRI compatible- will be able to obtain  Monday as currently shortstaffed ?18. Anxiety with MRI: Valium '5mg'$  ordered for 30 minutes prior to MRI, discussed with nursing.  ? 4/10- MRI still pending ? ?LOS: ?5 days ?A FACE TO FACE EVALUATION WAS PERFORMED ? ?[name] ?10/28/2021, 2:38 PM  ? ?  ?

## 2021-10-29 ENCOUNTER — Ambulatory Visit (INDEPENDENT_AMBULATORY_CARE_PROVIDER_SITE_OTHER): Payer: Medicare HMO

## 2021-10-29 DIAGNOSIS — I495 Sick sinus syndrome: Secondary | ICD-10-CM

## 2021-10-29 LAB — CUP PACEART REMOTE DEVICE CHECK
Battery Remaining Longevity: 61 mo
Battery Voltage: 2.99 V
Brady Statistic AP VP Percent: 0.11 %
Brady Statistic AP VS Percent: 53.28 %
Brady Statistic AS VP Percent: 0.09 %
Brady Statistic AS VS Percent: 46.52 %
Brady Statistic RA Percent Paced: 53.39 %
Brady Statistic RV Percent Paced: 0.21 %
Date Time Interrogation Session: 20230410142555
Implantable Lead Implant Date: 20170814
Implantable Lead Implant Date: 20170814
Implantable Lead Location: 753859
Implantable Lead Location: 753860
Implantable Lead Model: 5076
Implantable Lead Model: 5076
Implantable Pulse Generator Implant Date: 20170814
Lead Channel Impedance Value: 361 Ohm
Lead Channel Impedance Value: 437 Ohm
Lead Channel Impedance Value: 608 Ohm
Lead Channel Impedance Value: 665 Ohm
Lead Channel Pacing Threshold Amplitude: 0.625 V
Lead Channel Pacing Threshold Amplitude: 1 V
Lead Channel Pacing Threshold Pulse Width: 0.4 ms
Lead Channel Pacing Threshold Pulse Width: 0.4 ms
Lead Channel Sensing Intrinsic Amplitude: 19.625 mV
Lead Channel Sensing Intrinsic Amplitude: 19.625 mV
Lead Channel Sensing Intrinsic Amplitude: 3.5 mV
Lead Channel Sensing Intrinsic Amplitude: 3.5 mV
Lead Channel Setting Pacing Amplitude: 2 V
Lead Channel Setting Pacing Amplitude: 2.5 V
Lead Channel Setting Pacing Pulse Width: 0.4 ms
Lead Channel Setting Sensing Sensitivity: 0.9 mV

## 2021-10-29 MED ORDER — LORAZEPAM 1 MG PO TABS: 2.0000 mg | ORAL_TABLET | Freq: Once | ORAL | Status: AC

## 2021-10-29 MED ORDER — LORAZEPAM 1 MG PO TABS: 2.0000 mg | ORAL_TABLET | Freq: Once | ORAL | Status: DC

## 2021-10-29 MED ORDER — LORAZEPAM 2 MG/ML IJ SOLN
2.0000 mg | Freq: Once | INTRAMUSCULAR | Status: AC
  Administered 2021-10-30: 2 mg via INTRAVENOUS
  Filled 2021-10-29: qty 1

## 2021-10-29 MED ORDER — DIAZEPAM 5 MG PO TABS: 10.0000 mg | ORAL_TABLET | Freq: Every day | ORAL | Status: DC | PRN

## 2021-10-29 MED ORDER — LORAZEPAM 2 MG/ML IJ SOLN
2.0000 mg | Freq: Once | INTRAMUSCULAR | Status: DC
Start: 2021-10-29 — End: 2021-10-29

## 2021-10-29 NOTE — Patient Care Conference (Signed)
Inpatient RehabilitationTeam Conference and Plan of Care Update ?Date: 10/29/2021   Time: 10:10 AM  ? ? ?Patient Name: [name]      ?Medical Record Number: [id number]  ?Date of Birth: 1945/11/02 ?Sex: Male         ?Room/Bed: 6I68E/3O12Y-48 ?Payor Info: Payor: [employer] MEDICARE / Plan: [employer] MEDICARE HMO/PPO / Product Type: *No Product type* /   ? ?Admit Date/Time:  10/23/2021  1:41 PM ? ?Primary Diagnosis:  Stroke (cerebrum) ([place]) ? ?Hospital Problems: Principal Problem: ?  Stroke (cerebrum) ([place]) ? ? ? ?Expected Discharge Date: Expected Discharge Date: 11/01/21 ? ?Team Members Present: ?Physician leading conference: Dr. Leeroy Cha ?Social Worker Present: Loralee Pacas, LCSWA ?Nurse Present: Dorthula Nettles, RN ?PT Present: Apolinar Junes, PT ?OT Present: Laverle Hobby, OT ?SLP Present: Other (comment) Romelle Starcher, SLP) ?PPS Coordinator present : Raechel Ache, OT ? ?   Current Status/Progress Goal Weekly Team Focus  ?Bowel/Bladder ? ? continent B/B, lbm 4/9  maintain continence  toilets as needed   ?Swallow/Nutrition/ Hydration ? ?           ?ADL's ? ? S overall for ADLs and mobility; poor endurance and balance impacting higher level skills needed for IADLs  S-MOD I  higher level balance, dual task, IADL/ADL retraining, [place], L NMR   ?Mobility ? ? Supervision overall, gait >1000 ft, 4 steps  Supervision-mod I overall  D/c planning, HEP, patient/caregiver education, strengthening, activity tolerance, functional mobility, gait trainging   ?Communication ? ?           ?Safety/Cognition/ Behavioral Observations ? Supervision for recall  Complex daily problem-solving - Mod I; Recalling novel information - Supervision; Utilizing memory aids - Supervision; Divided attention - Mod I  Pill organizer, money, scheduling, recall   ?Pain ? ? chronic 7/10 r/t arthritis  < 3  assess pain q 4 hr and prn   ?Skin ? ? CDI  no new breakdown  assess skin q shift and prn   ? ? ?Discharge Planning:  ?D/c to home with his wife who  will provide 24/7 care and only able to provide supervision.   ?Team Discussion: ?PTSD. MRI yesterday, no changes. 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 Continent B/B, left side weakness improving. Reports 7/10 pain mostly due to chronic arthritis. Discharging home with spouse, she has short term memory and can only provide supervision. Nursing to give 1 mg Ativan IM before scheduled MRI. ? ?Patient on target to meet rehab goals: ?yes, supervision to mod I overall. Supervision with ADL's and mobility. HEP given today. Participating in memory strategies. ? ?*See Care Plan and progress notes for long and short-term goals.  ? ?Revisions to Treatment Plan:  ?Adjusting medications, scheduled MRI ?  ?Teaching Needs: ?Family education, medication/pain management, safety awareness, transfer/gait training, etc. ?  ?Current Barriers to Discharge: ?Decreased caregiver support and Lack of/limited family support ? ?Possible Resolutions to Barriers: ?Family education ?Follow up PT/OT/SLP ?Order recommended DME ?  ? ? Medical Summary ?  ?  ?  ?  ? ? ?  ? ? ?I attest that I was present, lead the team conference, and concur with the assessment and plan of the team. ? ? ?Dorthula Nettles G ?10/29/2021, 1:56 PM  ? ? ? ? ? ? ?

## 2021-10-29 NOTE — Progress Notes (Signed)
Occupational Therapy Session Note ? ?Patient Details  ?Name: [name] ?MRN: [id number] ?Date of Birth: 04-24-1946 ? ?Today's Date: 10/29/2021 ?OT Individual Time: 4235-3614 ?OT Individual Time Calculation (min): 39 min  ? ? ?Short Term Goals: ?Week 1:  OT Short Term Goal 1 (Week 1): Pt will complete full ADL with no more than min cuing for safety awareness ?OT Short Term Goal 2 (Week 1): Pt will improve LUE function on [facility] assessments by 20% ?OT Short Term Goal 3 (Week 1): Pt will completes functional transfers with LRAD and supervision ? ?Skilled Therapeutic Interventions/Progress Updates:  ?   ?Pt received in bed with no pain. Pt wanting to walk without the walker.  ?Pt completes functional mobility with no AD with MIN A to/from the tx gym. ?Therapeutic exercise ?Functonal mobility therex/conditioning: ?1 min each x15 rest ?Sit ups to a foam wedge ? sit to stand,  ?towel resistance presses ? ?3 rounds with 2 min rest between rounds for improved functional moiblity &conditioning requried for BADLs/IADLs ? ?Pt left at end of session in bed with exit alarm on, call light in reach and all needs met ? ? ?Therapy Documentation ?Precautions:  ?Precautions ?Precautions: Fall ?Restrictions ?Weight Bearing Restrictions: No ? ?Other Treatments:   ? ? ?Therapy/Group: Individual Therapy ? ?Lowella Dell [name] ?10/29/2021, 6:50 AM ?

## 2021-10-29 NOTE — Progress Notes (Signed)
We are still awaiting his cervical MRI. ?

## 2021-10-29 NOTE — Progress Notes (Signed)
Physical Therapy Session Note ? ?Patient Details  ?Name: [name] ?MRN: [id number] ?Date of Birth: 04/26/1946 ? ?Today's Date: 10/29/2021 ?PT Individual Time: 0910-1000 and 4098-1191 ?PT Individual Time Calculation (min): 50 min and 45 min ? ?Short Term Goals: ?Week 1:  PT Short Term Goal 1 (Week 1): Patient will perform basic transfers with supervision consistently. ?PT Short Term Goal 2 (Week 1): Patient will ambulate with CGA >150 ft using LRAD. ?PT Short Term Goal 3 (Week 1): Patient will perform a standardized balance assessment. ? ?Skilled Therapeutic Interventions/Progress Updates:  ?   ?Session 1: ?Patient sitting EOB upon PT arrival. Patient alert and agreeable to PT session. Patient denied pain, outside of chronic joint pain during session. Patient declined intervention for his joint pain at this time. ? ?Therapeutic Activity: ?Transfers: Patient performed sit to/from stand x5 with supervision-mod I without an AD. Provided verbal cues for forward weight shift x2. ? ?Gait Training:  ?Patient ambulated >65 feet x2 without AD with min A. Ambulated with decreased gait speed, decreased step length and height, decreased lateral hip stability L>R, increased B toe out, forward trunk lean, and downward head gaze. Provided verbal cues for erect posture, looking ahead, increased gluteal activation in stance, and increased arm swing. ? ?Therapeutic Exercise: ?Patient performed 2 sets of the following exercises from HEP handout provided during session with verbal and tactile cues for proper technique. ?Access Code: [email or web address] ?- Squat with Chair Touch  - 3 x daily - 7 x weekly - 2 sets - 10 reps ?- Side Stepping with Resistance at Thighs and Counter Support  - 3 x daily - 7 x weekly - 2 sets - 10 reps ?- Seated Hip Abduction with Resistance  - 3 x daily - 7 x weekly - 2 sets - 15 reps ?- Supine Bridge  - 3 x daily - 7 x weekly - 2 sets - 10 reps ? ?Patient sitting EOB at end of session with breaks locked, bed alarm  set, and all needs within reach. Patient missed 10 min of morning session, will make up missed time this afternoon, patient in agreement. ? ?Session 2: ?Patient in bed upon PT arrival. Patient alert and agreeable to PT session. Patient denied pain during session. ? ?Informed patient of d/c date and patient requested to call his wife. Discussed d/c date and family education with patient's wife, she needs to assess her schedule for her granddaughter's medical appointments. Instructed her to discuss her schedule with CSW this afternoon.  ? ?Therapeutic Activity: ?Bed Mobility: Patient performed supine to/from sit independently.  ?Transfers: Patient performed sit to/from stand x4 with supervision-mod I with RW.  ? ?Gait Training:  ?Patient ambulated >800 feet x2 using RW with supervision over level indoor surfaces and unlevel sidewalk outside the [place]. Last 100 feet of second trial patient used a SPC without need for additional assist or gait deviations. Ambulated with decreased gait speed, decreased step length and height, decreased lateral hip activation in stance L>R, forward trunk lean, and downward head gaze. Provided verbal cues for erect posture and increased gluteal activation in stance. ? ?Cleared patient for use of SPC with mobility in the room, nursing staff and safety plan updated.  ? ?Patient in bed at end of session with breaks locked, bed alarm set, and all needs within reach.  ? ?Therapy Documentation ?Precautions:  ?Precautions ?Precautions: Fall ?Restrictions ?Weight Bearing Restrictions: No ? ? ?Therapy/Group: Individual Therapy ? ?Doreene Burke PT, DPT ? ?10/29/2021, 11:26 AM  ?

## 2021-10-29 NOTE — Progress Notes (Signed)
Speech Language Pathology Daily Session Note ? ?Patient Details  ?Name: [name] ?MRN: [id number] ?Date of Birth: 1946/02/19 ? ?Today's Date: 10/29/2021 ?SLP Individual Time: 2637-8588 ?SLP Individual Time Calculation (min): 30 min ? ?Short Term Goals: ?Week 1: SLP Short Term Goal 1 (Week 1): Patient will recall new, daily information with supervision level verbal cues for use of compensatory strategies. ?SLP Short Term Goal 2 (Week 1): Patient will demonstrate functional problem solving for mildly complex tasks with Mod I. ?SLP Short Term Goal 3 (Week 1): Patient will demonstrate divided attention with high-level functional tasks for 30 mintues with supervision level verbal cues for redirection. ? ?Skilled Therapeutic Interventions:Skilled ST services focused on cognitive skills. Pt demonstrated recall of medication with sup A verbal cues, unable to locate medication list, SLP created new list and pt demonstrated recall of medication name/purpose/times per day fading to mod I. SLP facilitated complex functional problem solving in medication management task utilizing QID organizer to reflect similar home set up, pt demonstrated mod I. Pt was left in room with call bell within reach and chair alarm set. SLP recommends to continue skilled services. ?   ? ?Pain ?Pain Assessment ?Pain Scale: 0-10 ?Pain Score: 0-No pain ? ?Therapy/Group: Individual Therapy ? ?[name] ?10/29/2021, 10:50 AM ?

## 2021-10-29 NOTE — Progress Notes (Signed)
Occupational Therapy Session Note ? ?Patient Details  ?Name: [name] ?MRN: [id number] ?Date of Birth: 10/07/1945 ? ?Today's Date: 10/29/2021 ?OT Individual Time: 0734-0830 ?OT Individual Time Calculation (min): 56 min  ? ? ?Short Term Goals: ?Week 1:  OT Short Term Goal 1 (Week 1): Pt will complete full ADL with no more than min cuing for safety awareness ?OT Short Term Goal 2 (Week 1): Pt will improve LUE function on [facility] assessments by 20% ?OT Short Term Goal 3 (Week 1): Pt will completes functional transfers with LRAD and supervision ? ?Skilled Therapeutic Interventions/Progress Updates:  ?  Pt received supine with no c/o pain, agreeable to OT session. Pt reporting he is a little groggy from yesterday's ativan. He completed an ambulatory transfer into the bathroom with close (S), requiring min cueing for sequencing/safety when doffing LB clothing for the shower seated. He took his shower ,in good spirits and singing christmas music! He completed the majority of his bathing seated with supervision. He returned to EOB requiring min cueing to sit to don pants. He donned all clothes with supervision from EOB. Oral care standing with (S). Min A to don socks d/t gripper socks being tight. He completed 200 ft of functional mobility with (S) using the RW to increase functional activity tolerance. He requested to attempt and pick something up off the floor. He was able to do so with min guard- provided copious education on how this was not something safe to do alone. He returned to his room and was left sitting EOB with bed alarm set, all needs met.  ? ? ?Therapy Documentation ?Precautions:  ?Precautions ?Precautions: Fall ?Restrictions ?Weight Bearing Restrictions: No ? ?Therapy/Group: Individual Therapy ? ?Sandra H Davis ?10/29/2021, 6:22 AM ?

## 2021-10-29 NOTE — Progress Notes (Signed)
?                                                       PROGRESS NOTE ? ? ?Subjective/Complaints: ?Able to tolerate MRI Brain yesterday, but not cervical MRI= I see that Valium was given and Ativan was givenKatharine Look OT notes that IM Ativan is what helped him so will order for again for today for prior to MRI ? ?ROS:  ?Pt denies SOB, abd pain, CP, N/V/C/D, and vision changes, +weakness ? ? ?Objective: ?  ?MR BRAIN WO CONTRAST ? ?Result Date: 10/28/2021 ?CLINICAL DATA:  Lower extremity weakness, stroke suspected EXAM: MRI HEAD WITHOUT CONTRAST TECHNIQUE: Multiplanar, multiecho pulse sequences of the brain and surrounding structures were obtained without intravenous contrast. COMPARISON:  CT/CTA head and neck 10/18/2021 FINDINGS: Brain: There is no evidence of acute intracranial hemorrhage, extra-axial fluid collection, or acute infarct. Background parenchymal volume is normal for age. The ventricles are normal in size. 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 Patchy FLAIR signal abnormality in the subcortical and periventricular white matter likely reflects sequela of mild chronic white matter microangiopathy. There is no suspicious parenchymal signal abnormality. There is no mass lesion. There is no mass effect or midline shift. Vascular: Normal flow voids. Skull and upper cervical spine: Normal marrow signal. Sinuses/Orbits: There is mild mucosal thickening in the paranasal sinuses. The globes and orbits are unremarkable. Other: None. IMPRESSION: No acute intracranial pathology.  Unremarkable for age brain MRI. Electronically Signed   By: Valetta Mole M.D.   On: 10/28/2021 16:19   ?Recent Labs  ?  10/28/21 ?0737  ?WBC 8.6  ?HGB 9.6*  ?HCT 30.8*  ?PLT 229  ? ? ?Recent Labs  ?  10/28/21 ?0737  ?NA 136  ?K 3.9  ?CL 101  ?CO2 27  ?GLUCOSE 205*  ?BUN 15  ?CREATININE 0.94  ?CALCIUM 8.4*  ? ? ? ?Intake/Output Summary (Last 24 hours) at 10/29/2021 4403 ?Last data filed at 10/28/2021 2036 ?Gross per 24  hour  ?Intake --  ?Output 400 ml  ?Net -400 ml  ?  ? ?  ? ?Physical Exam: ?Vital Signs ?Blood pressure (!) 108/59, pulse 77, temperature 98.4 ?F (36.9 ?C), resp. rate 18, height '5\' 9"'$  (1.753 m), weight 96.7 kg, SpO2 96 %. ?Gen: no distress, normal appearing ?HEENT: oral mucosa pink and moist, NCAT ?Cardio: Reg rate ?Chest: normal effort, normal rate of breathing ?Abd: soft, non-distended ?Ext: no edema ?Psych: pleasant, normal affect ? ?Skin: Clean and intact without signs of breakdown ?Neuro:  LUE 3 to 3+/5. LLE 3/5 prox to 4/5 distally--inconsistent effort. Sensory 1/2 LUE and LLE. DTR's 3+ in LE's and at least 2+ UE's.  Clonus in both ankles.-- ?Musculoskeletal: generalized joint tenderness with palpation.--no changes   ? ? ?Assessment/Plan: ?1. Functional deficits which require 3+ hours per day of interdisciplinary therapy in a comprehensive inpatient rehab setting. ?Physiatrist is providing close team supervision and 24 hour management of active medical problems listed below. ?Physiatrist and rehab team continue to assess barriers to discharge/monitor patient progress toward functional and medical goals ? ?Care Tool: ? ?Bathing ? Bathing activity did not occur: Refused (will do tomorrow) ?Body parts bathed by patient: Right arm, Left arm, Chest, Abdomen, Front perineal area, Buttocks, Right upper leg, Left upper leg, Right lower leg, Left  lower leg, Face  ?   ?  ?  ?Bathing assist Assist Level: Supervision/Verbal cueing ?  ?  ?Upper Body Dressing/Undressing ?Upper body dressing Upper body dressing/undressing activity did not occur (including orthotics): Refused ?What is the patient wearing?: Pull over shirt ?   ?Upper body assist Assist Level: Supervision/Verbal cueing ?   ?Lower Body Dressing/Undressing ?Lower body dressing ? ? ? Lower body dressing activity did not occur: Refused ?What is the patient wearing?: Pants, Underwear/pull up ? ?  ? ?Lower body assist Assist for lower body dressing:  Supervision/Verbal cueing ?   ? ?Toileting ?Toileting    ?Toileting assist Assist for toileting: Supervision/Verbal cueing ?  ?  ?Transfers ?Chair/bed transfer ? ?Transfers assist ? Chair/bed transfer activity did not occur: N/A ? ?Chair/bed transfer assist level: Supervision/Verbal cueing ?Chair/bed transfer assistive device: Walker ?  ?Locomotion ?Ambulation ? ? ?Ambulation assist ? ?   ? ?Assist level: Minimal Assistance - Patient > 75% ?Assistive device: Walker-rolling ?Max distance: >115f  ? ?Walk 10 feet activity ? ? ?Assist ?   ? ?Assist level: Contact Guard/Touching assist ?Assistive device: Walker-rolling  ? ?Walk 50 feet activity ? ? ?Assist   ? ?Assist level: Minimal Assistance - Patient > 75% ?Assistive device: Walker-rolling  ? ? ?Walk 150 feet activity ? ? ?Assist   ? ?Assist level: Minimal Assistance - Patient > 75% ?Assistive device: Walker-rolling ?  ? ?Walk 10 feet on uneven surface  ?activity ? ? ?Assist   ? ? ?Assist level: Minimal Assistance - Patient > 75% ?Assistive device: Walker-rolling  ? ?Wheelchair ? ? ? ? ?Assist Is the patient using a wheelchair?: Yes ?Type of Wheelchair: Manual ?  ? ?Wheelchair assist level: Supervision/Verbal cueing ?Max wheelchair distance: >200 ft  ? ? ?Wheelchair 50 feet with 2 turns activity ? ? ? ?Assist ? ?  ?  ? ? ?Assist Level: Supervision/Verbal cueing  ? ?Wheelchair 150 feet activity  ? ? ? ?Assist ?   ? ? ?Assist Level: Supervision/Verbal cueing  ? ?Blood pressure (!) 108/59, pulse 77, temperature 98.4 ?F (36.9 ?C), resp. rate 18, height '5\' 9"'$  (1.753 m), weight 96.7 kg, SpO2 96 %. ? ?Medical Problem List and Plan: ?1. Functional deficits secondary to right sided ischemic stroke and debility from recent Covid 19 infection ?            -patient may  shower ?            -ELOS/Goals: 7-10d Mod I ? -Continue CIR- PT, OT - MRI pending.  ?2.  Antithrombotics: ?-DVT/anticoagulation:  Pharmaceutical: Other (comment)Eliquis ?            -antiplatelet therapy:  none ?3. Pain: continue Tylenol, Tramadol prn ?4. Mood: LCSW to evaluate and provide emotional support ?            -antipsychotic agents: n/a ?5. Neuropsych: This patient is capable of making decisions on his own behalf. ?6. Skin/Wound Care: Routine skin care checks ?7. Fluids/Electrolytes/Nutrition: encourage PO   ?8: Hypertension: continue Imdur ? 4/7 bp controlled ? 4/10- BP controlled- con't regimen ?9: Chronic diastolic HF: euvolemic; continue to monitor ?            --need daily weights ?            --continue Lasix 40 mg daily and monitor K+ ? 4/9: weight steadily decreasing ? 4/11: weight stable, continue current dose ?Filed Weights  ? 10/27/21 0418 10/28/21 0500 10/29/21 0500  ?Weight: 96 kg 95.1 kg  96.7 kg  ?  ?10: Hyperlipidemia: continue Zetia (on Praluent at home) ?11: CAD: s/p CABG 2010; left heart cath 01/2021 ?--CP yesterday, 4/4: EKG and troponin negative ?            --NTG as needed ?12: Atrial fibrillation: continue Eliquis, Pacerone ?13: COPD: Continue Dulera ?            --albuterol nebs as needed ? -encouraged IS, OOB ?14: GERD: ?15: OSA: continue CPAP ?  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            --follow-up with Dr. 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  Pt with bilateral sustained clonus at the ankles 3+ DTR throughout ? 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  ? -plan for cervical MRI tomorrow, discussed with patient ? -Brain MRI is stable.  ?18. Anxiety with MRI: Ativan ordered for prior to MRI tomorrow.  ? ?LOS: ?6 days ?A FACE TO FACE EVALUATION WAS PERFORMED ? ?Martha Clan P Jujuan Dugo ?10/29/2021, 9:22 AM  ? ?  ?

## 2021-10-29 NOTE — Progress Notes (Signed)
Speech Language Pathology Daily Session Note ? ?Patient Details  ?Name: [name] ?MRN: [id number] ?Date of Birth: 11-16-1945 ? ?Today's Date: 10/29/2021 ?SLP Individual Time: 4158-3094 ?SLP Individual Time Calculation (min): 55 min ? ?Short Term Goals: ?Week 1: SLP Short Term Goal 1 (Week 1): Patient will recall new, daily information with supervision level verbal cues for use of compensatory strategies. ?SLP Short Term Goal 2 (Week 1): Patient will demonstrate functional problem solving for mildly complex tasks with Mod I. ?SLP Short Term Goal 3 (Week 1): Patient will demonstrate divided attention with high-level functional tasks for 30 mintues with supervision level verbal cues for redirection. ? ?Skilled Therapeutic Interventions: Skilled ST treatment focused on cognitive goals. SLP facilitated session by providing min A fading to sup A verbal cues for solving moderately complex functional calculations and money management activies; sup A verbal cues to achieve 9/10 on ALFA "Solving Daily Math Problems" subtest. Pt exhibited appropriate self monitoring and self correction throughout with occasional verbal cues to redirect attention to task. Pt verbalized home medication management system in detail in addition to recalling names and purpose of medications with mod I; referred to new medication list made earlier on bedside table as validation. Pt verbalized up to 3 memory strategies with independence (i.e., writing things down, medication pillbox, sticking with a consistent routine.) Patient was left in bed with alarm activated and immediate needs within reach at end of session. Continue per current plan of care.   ?   ?Pain ?Pain Assessment ?Pain Scale: 0-10 ?Pain Score: 0-No pain ? ?Therapy/Group: Individual Therapy ? ?Spurgeon Gancarz T Inioluwa Baris ?10/29/2021, 1:57 PM ?

## 2021-10-29 NOTE — Progress Notes (Addendum)
Patient ID: [name], male   DOB: Sep 21, 1945, 76 y.o.   MRN: [id number] ? ?29- SW spoke with pt wife to provide updates from team conference, and d/c date 4/14. She was aware already. No HHA prefernece. She is aware SW co-workers will confirm HHA. Fam edu scheduled for Thuesday 10am-12pm with wife. Pt will be late discharge on date of d/c, around 5pm as his wife has to take granddaughter to an appointment.  ? ?SW sent HHPT/OT/SLP/aide referral to Angie/[facility]) and waiting on follow-up.  ? ?SW met with pt in room to discuss above.  ? ?Loralee Pacas, MSW, LCSWA ?Office: [phone] ?Cell: [phone] ?Fax: [phone]  ?

## 2021-10-29 NOTE — Progress Notes (Signed)
Patient placed on CPAP by RN.  Patient resting well.  RT will continue to monitor. ?

## 2021-10-30 ENCOUNTER — Inpatient Hospital Stay (HOSPITAL_COMMUNITY): Payer: Medicare HMO

## 2021-10-30 DIAGNOSIS — H9193 Unspecified hearing loss, bilateral: Secondary | ICD-10-CM

## 2021-10-30 DIAGNOSIS — J1282 Pneumonia due to coronavirus disease 2019: Secondary | ICD-10-CM

## 2021-10-30 DIAGNOSIS — U071 COVID-19: Secondary | ICD-10-CM

## 2021-10-30 DIAGNOSIS — J440 Chronic obstructive pulmonary disease with acute lower respiratory infection: Secondary | ICD-10-CM | POA: Diagnosis not present

## 2021-10-30 DIAGNOSIS — G4733 Obstructive sleep apnea (adult) (pediatric): Secondary | ICD-10-CM

## 2021-10-30 DIAGNOSIS — Z950 Presence of cardiac pacemaker: Secondary | ICD-10-CM

## 2021-10-30 DIAGNOSIS — I480 Paroxysmal atrial fibrillation: Secondary | ICD-10-CM

## 2021-10-30 DIAGNOSIS — F32A Depression, unspecified: Secondary | ICD-10-CM

## 2021-10-30 DIAGNOSIS — Z8673 Personal history of transient ischemic attack (TIA), and cerebral infarction without residual deficits: Secondary | ICD-10-CM

## 2021-10-30 DIAGNOSIS — G8929 Other chronic pain: Secondary | ICD-10-CM

## 2021-10-30 DIAGNOSIS — Z7901 Long term (current) use of anticoagulants: Secondary | ICD-10-CM

## 2021-10-30 DIAGNOSIS — I2510 Atherosclerotic heart disease of native coronary artery without angina pectoris: Secondary | ICD-10-CM

## 2021-10-30 DIAGNOSIS — F4310 Post-traumatic stress disorder, unspecified: Secondary | ICD-10-CM

## 2021-10-30 DIAGNOSIS — Z9981 Dependence on supplemental oxygen: Secondary | ICD-10-CM

## 2021-10-30 DIAGNOSIS — Z87891 Personal history of nicotine dependence: Secondary | ICD-10-CM

## 2021-10-30 DIAGNOSIS — Z951 Presence of aortocoronary bypass graft: Secondary | ICD-10-CM

## 2021-10-30 DIAGNOSIS — I5032 Chronic diastolic (congestive) heart failure: Secondary | ICD-10-CM

## 2021-10-30 DIAGNOSIS — I110 Hypertensive heart disease with heart failure: Secondary | ICD-10-CM

## 2021-10-30 DIAGNOSIS — I060 Rheumatic aortic stenosis: Secondary | ICD-10-CM

## 2021-10-30 DIAGNOSIS — Z96643 Presence of artificial hip joint, bilateral: Secondary | ICD-10-CM

## 2021-10-30 DIAGNOSIS — K219 Gastro-esophageal reflux disease without esophagitis: Secondary | ICD-10-CM

## 2021-10-30 DIAGNOSIS — E785 Hyperlipidemia, unspecified: Secondary | ICD-10-CM

## 2021-10-30 DIAGNOSIS — C9190 Lymphoid leukemia, unspecified not having achieved remission: Secondary | ICD-10-CM

## 2021-10-30 DIAGNOSIS — M1991 Primary osteoarthritis, unspecified site: Secondary | ICD-10-CM

## 2021-10-30 DIAGNOSIS — F419 Anxiety disorder, unspecified: Secondary | ICD-10-CM

## 2021-10-30 IMAGING — MR MR CERVICAL SPINE W/O CM
4 of 5 series · 19 of 48 positions shown · non-contrast
Comparison: CT cervical spine [DATE]

CLINICAL DATA: Lower extremity weakness.  Cervical spinal stenosis.

EXAM:
MRI CERVICAL SPINE WITHOUT CONTRAST
TECHNIQUE: Multiplanar, multisequence MR imaging of the cervical spine was
performed. No intravenous contrast was administered.

[Series 3: T2 · sagittal · 3.0mm · 0.43mm/px · 7 of 14 slices shown (1 of 2)]
[im 1/14]
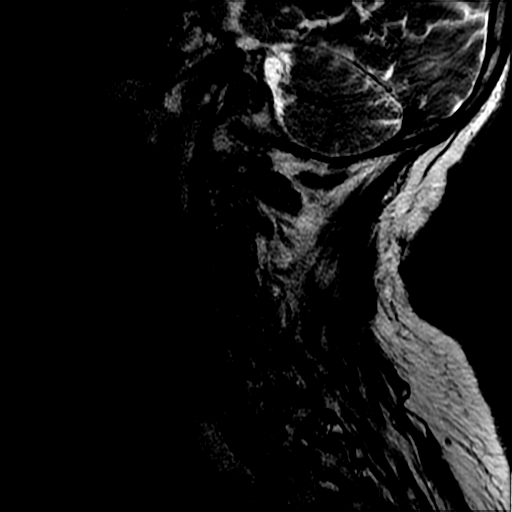
[im 3/14]
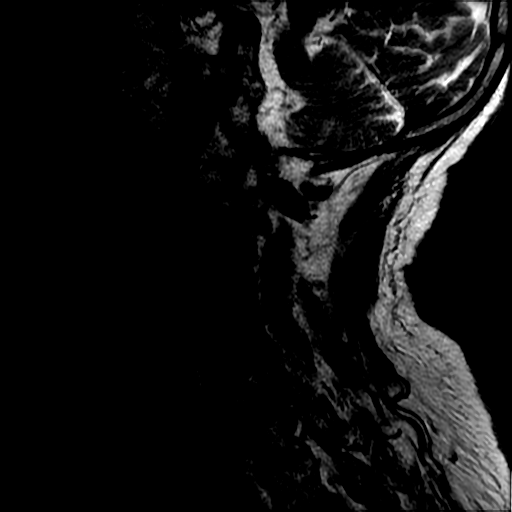
[im 5/14]
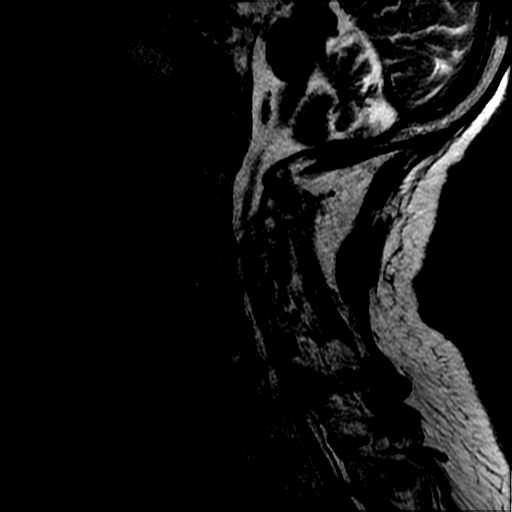
[im 7/14]
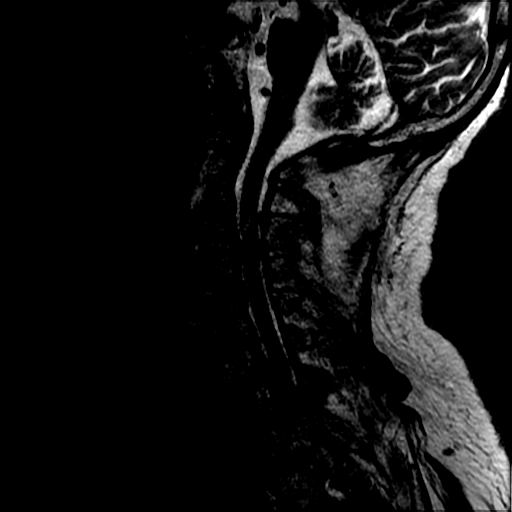
[im 9/14]
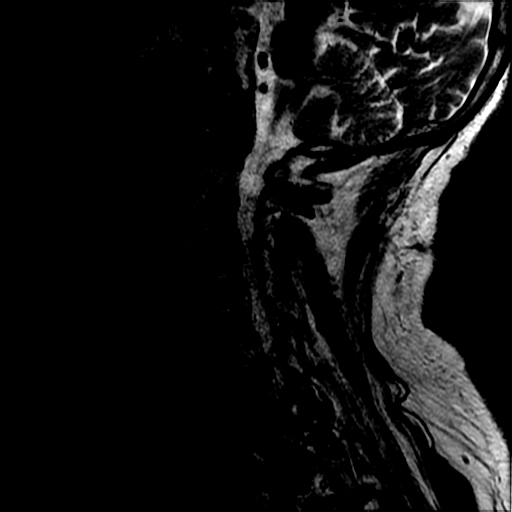
[im 11/14]
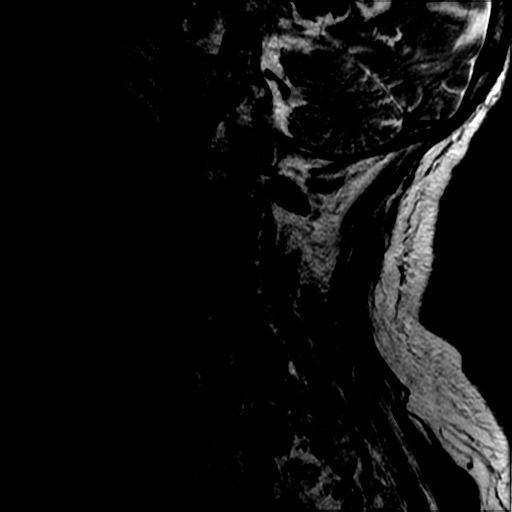
[im 14/14]
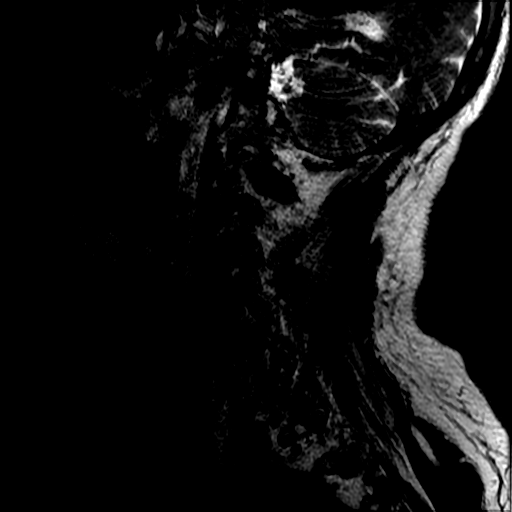

[Series 4: sag ir · sagittal · 3.0mm · 0.43mm/px · 3 of 14 slices shown]
[im 3/14]
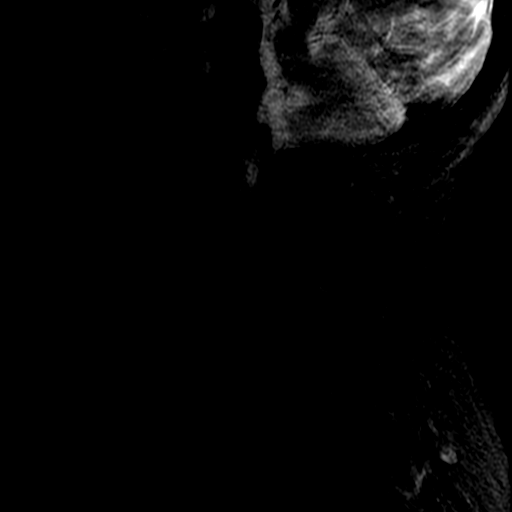
[im 7/14]
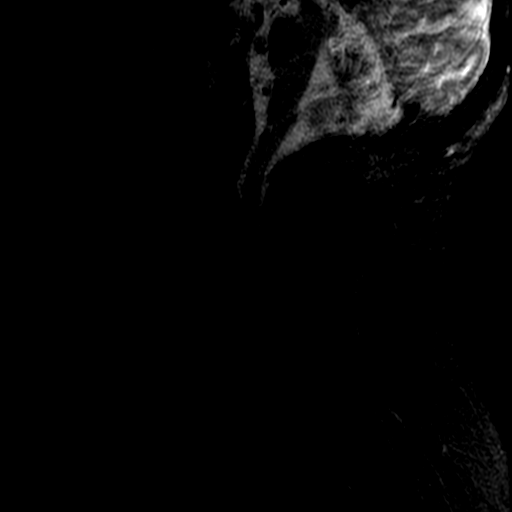
[im 11/14]
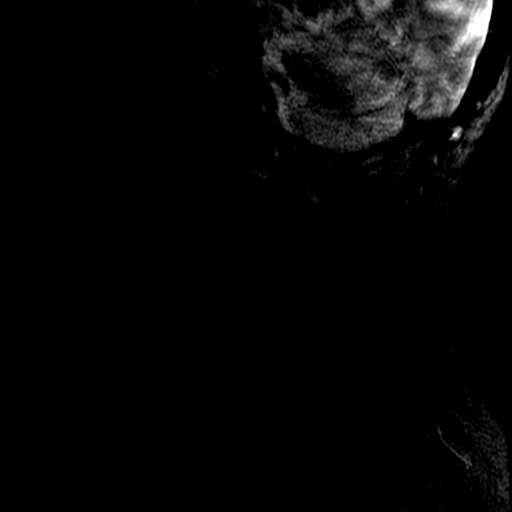

[Series 5: T1 · sagittal · 3.0mm · 0.43mm/px · 3 of 14 slices shown]
[im 3/14]
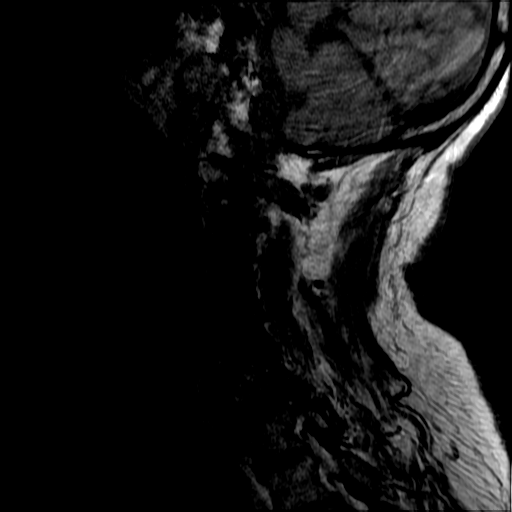
[im 8/14]
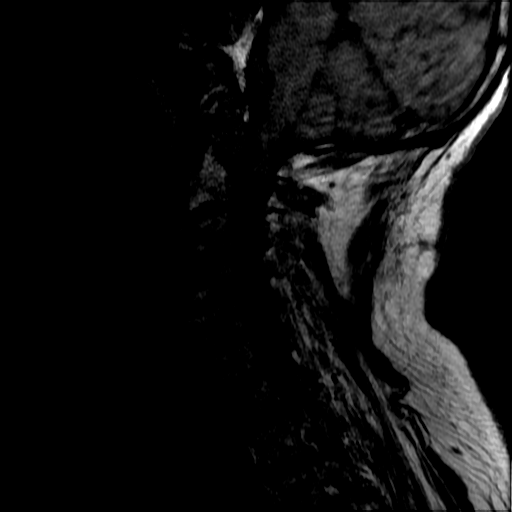
[im 14/14]
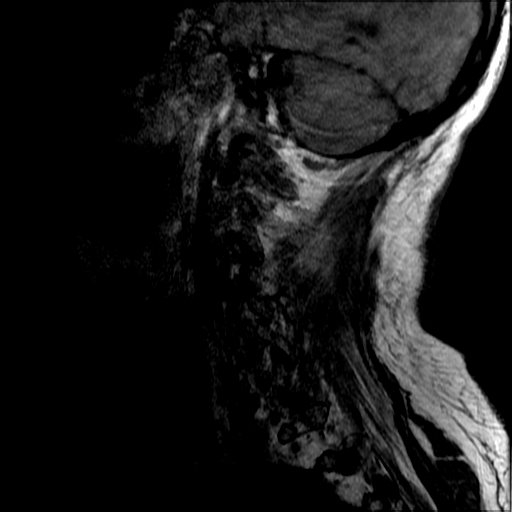

[Series 6: T2 · axial · 3.0mm · 0.39mm/px · z∈[-106,-24]mm · 6 of 32 slices shown (2 of 2)]
[im 1/32]
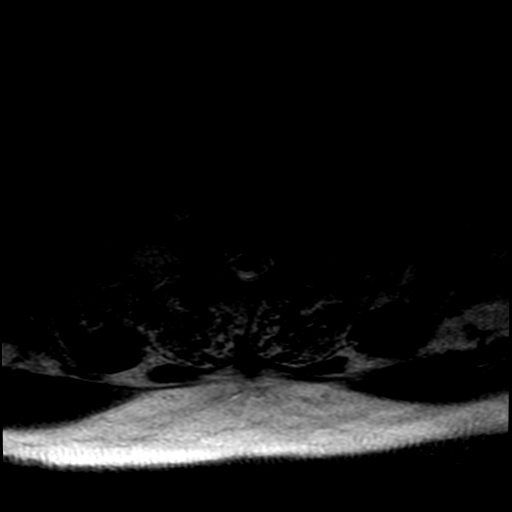
[im 5/32]
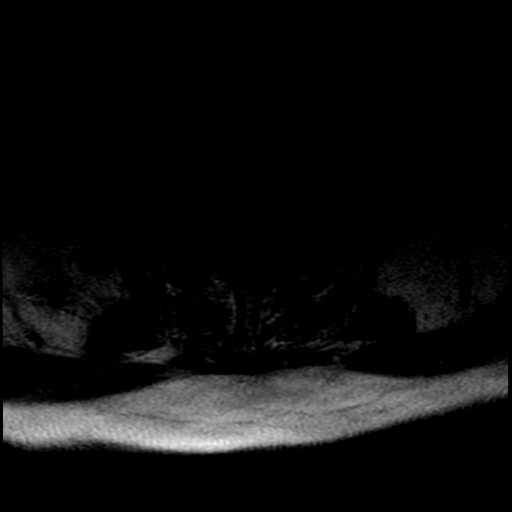
[im 10/32]
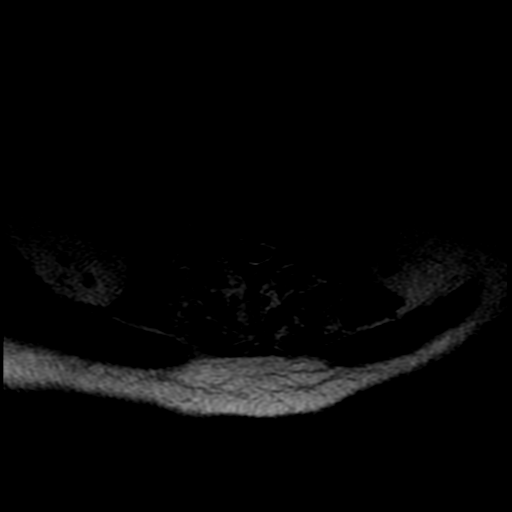
[im 15/32]
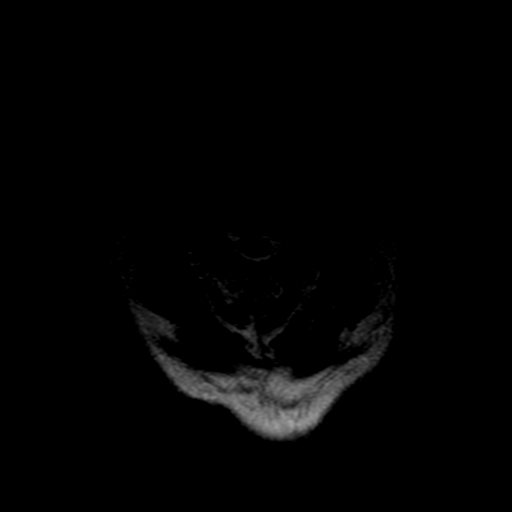
[im 17/32]
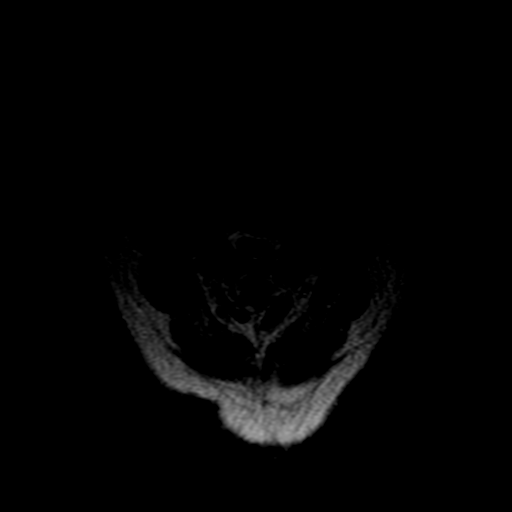
[im 27/32]
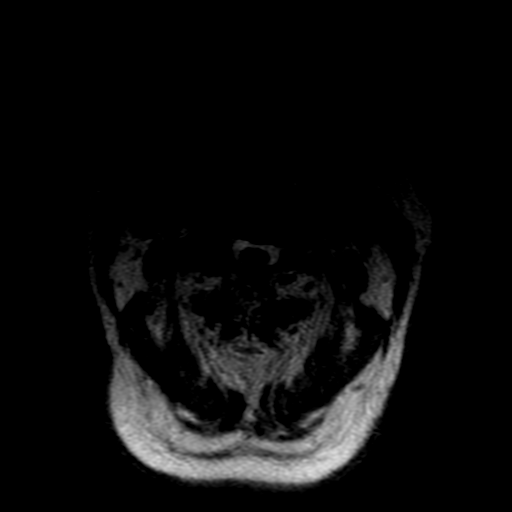

[19 of 48 positions shown; findings below may reference images not displayed]

FINDINGS: Alignment: Image quality degraded by moderate motion. The patient
was sedated for the study.

Mild retrolisthesis C3-4 and C4-5.  Mild anterolisthesis C7-T1

Vertebrae: Negative for fracture or mass

Cord: Spinal cord evaluation limited by significant motion. No cord
signal abnormality identified

Posterior Fossa, vertebral arteries, paraspinal tissues: Negative

Disc levels:

C2-3: Moderate to severe left foraminal encroachment due to
spurring. Spinal canal adequate in size

C3-4: Disc degeneration with diffuse uncinate spurring. Cord
flattening with moderate spinal stenosis. Moderate to severe
foraminal encroachment bilaterally.

C4-5: Disc degeneration with diffuse uncinate spurring. Mild spinal
stenosis. Severe foraminal stenosis bilaterally

C5-6: Solid arthrodesis.  Mild foraminal narrowing bilaterally

C6-7: Disc degeneration with diffuse uncinate spurring. Mild spinal
stenosis. Moderate foraminal stenosis bilaterally

C7-T1: Disc and facet degeneration. Mild to moderate spinal
stenosis. Moderate to severe foraminal stenosis bilaterally
IMPRESSION: Despite sedation, image quality degraded by significant motion

Multilevel spondylosis. Multilevel spinal and foraminal stenosis
throughout the cervical spine as described above.

## 2021-10-30 NOTE — Progress Notes (Signed)
Physical Therapy Session Note ? ?Patient Details  ?Name: [name] ?MRN: [id number] ?Date of Birth: 27-Mar-1946 ? ?Today's Date: 10/30/2021 ?PT Individual Time: 0459-9774 ?PT Individual Time Calculation (min): 45 min  ? ?Short Term Goals: ?Week 1:  PT Short Term Goal 1 (Week 1): Patient will perform basic transfers with supervision consistently. ?PT Short Term Goal 2 (Week 1): Patient will ambulate with CGA >150 ft using LRAD. ?PT Short Term Goal 3 (Week 1): Patient will perform a standardized balance assessment. ? ?Skilled Therapeutic Interventions/Progress Updates: Pt presents sitting EOB and agreeable to therapy.  Pt transfers sit to stand w/ supervision to mod I.  Pt amb > 150' w/ SPC and supervision through dayroom and to main gym.  Pt amb w/ ER B feet but no LOB.  Pt amb to small gym for balance activities including unilateral stance on cushioned surface and opposite knee-ups as well as toes on cushion and heels on cushion w/ CGA only.  Pt performed step-ups on 6" platform alternating LEs.  Pt performed gait on ramped surface w/ supervision and across mulch stepping down and up curb height step w/ CGA.  Pt amb back to room w/ supervision and SPC.  Pt returned to bed , sit to supine w/ supervision.  Bed alarm on and all needs in reach   ?   ? ?Therapy Documentation ?Precautions:  ?Precautions ?Precautions: Fall ?Restrictions ?Weight Bearing Restrictions: No ?General: ?  ?Vital Signs: ? ?Pain:no c/o ?  ? ?   ?Balance: ?Standardized Balance Assessment ?Standardized Balance Assessment: Berg Balance Test ?[name] Balance Test ?Sit to Stand: Able to stand without using hands and stabilize independently ?Standing Unsupported: Able to stand safely 2 minutes ?Sitting with Back Unsupported but Feet Supported on Floor or Stool: Able to sit safely and securely 2 minutes ?Stand to Sit: Sits safely with minimal use of hands ?Transfers: Able to transfer safely, minor use of hands ?Standing Unsupported with Eyes Closed: Able to  stand 10 seconds safely ?Standing Ubsupported with Feet Together: Able to place feet together independently and stand 1 minute safely ?From Standing, Reach Forward with Outstretched Arm: Can reach confidently >25 cm (10") ?From Standing Position, Pick up Object from Floor: Able to pick up shoe safely and easily ?From Standing Position, Turn to Look Behind Over each Shoulder: Turn sideways only but maintains balance ?Turn 360 Degrees: Able to turn 360 degrees safely but slowly ?Standing Unsupported, Alternately Place Feet on Step/Stool: Able to stand independently and complete 8 steps >20 seconds ?Standing Unsupported, One Foot in Front: Able to plae foot ahead of the other independently and hold 30 seconds (both sides) ?Standing on One Leg: Able to lift leg independently and hold equal to or more than 3 seconds (5 sec on L 11 sec on R) ?Total Score: 48 ?Exercises: ?  ?Other Treatments:   ? ? ? ?Therapy/Group: Individual Therapy ? ?[name] ?10/30/2021, 12:17 PM  ?

## 2021-10-30 NOTE — Progress Notes (Signed)
Occupational Therapy Session Note ? ?Patient Details  ?Name: [name] ?MRN: [id number] ?Date of Birth: May 03, 1946 ? ?Today's Date: 10/30/2021 ?OT Individual Time: 6073-7106 ?OT Individual Time Calculation (min): 25 min  ? ?Today's Date: 10/30/2021 ?OT MISSED time: 45 min ? ?Short Term Goals: ?Week 1:  OT Short Term Goal 1 (Week 1): Pt will complete full ADL with no more than min cuing for safety awareness ?OT Short Term Goal 2 (Week 1): Pt will improve LUE function on [facility] assessments by 20% ?OT Short Term Goal 3 (Week 1): Pt will completes functional transfers with LRAD and supervision ? ?Skilled Therapeutic Interventions/Progress Updates:  ?   ?Pt received in room standing with SPC with no staff present. Continued education that pt needs staff in the room before getting gup to go to bathroom by himself. Pt dismissive of education stating "I pulled the cord but no one came" hill rom system indicating cord was pulled 1 min 30 seconds ago. Reinforced education. No pain reported. ? ?Therapeutic activity ?Pt completes functional mobility to/from AD apartment with [facility] with no LOB and supervision. Pt requires CGA for bending to low dresser to obtain a comforter from a drawer and spreading out on the bed with NO AD and same to fold up and put back into drawer ? ?Pt left at end of session in EOB with exit alarm on, call light in reach and all needs met ? ?Session 2: ? ?Pt missed 45 min skilled OT d/t being down for MRI. Checked back after 15 min and still down. Will follow up per POC.  ? ? ?Therapy Documentation ?Precautions:  ?Precautions ?Precautions: Fall ?Restrictions ?Weight Bearing Restrictions: No ?General: ?  ?Therapy/Group: Individual Therapy ? ?Lowella Dell Leny Morozov ?10/30/2021, 6:48 AM ?

## 2021-10-30 NOTE — Progress Notes (Signed)
Patient ID: [name], male   DOB: November 08, 1945, 76 y.o.   MRN: [id number]  Awaiting return from [facility] to see if can accept pt's referral for home health follow up.  ?

## 2021-10-30 NOTE — Progress Notes (Signed)
Speech Language Pathology Daily Session Note ? ?Patient Details  ?Name: [name] ?MRN: [id number] ?Date of Birth: 01-Aug-1945 ? ?Today's Date: 10/30/2021 ?SLP Individual Time: 7408-1448 ?SLP Individual Time Calculation (min): 30 min ? ?Short Term Goals: ?Week 1: SLP Short Term Goal 1 (Week 1): Patient will recall new, daily information with supervision level verbal cues for use of compensatory strategies. ?SLP Short Term Goal 2 (Week 1): Patient will demonstrate functional problem solving for mildly complex tasks with Mod I. ?SLP Short Term Goal 3 (Week 1): Patient will demonstrate divided attention with high-level functional tasks for 30 mintues with supervision level verbal cues for redirection. ? ?Skilled Therapeutic Interventions:Skilled ST services focused on cognitive skills. Pt demonstrated recall of yesterday's ST events mod I. Pt supports only checking for error verse balancing accounts at home and politely denied account balancing task to address problem solving. SLP facilitated complex problem solving and working memory in scheduling task, pt required mod A verbal cues, however pt supports a more basic life style. Pt was left in room with call bell within reach and bed alarm set. SLP recommends to continue skilled services. ?   ? ?Pain ?Pain Assessment ?Pain Scale: 0-10 ?Pain Score: 0-No pain ?Faces Pain Scale: No hurt ?Multiple Pain Sites: No ? ?Therapy/Group: Individual Therapy ? ?[name] ?10/30/2021, 2:30 PM ?

## 2021-10-30 NOTE — Progress Notes (Signed)
Occupational Therapy Discharge Summary ? ?Patient Details  ?Name: [name] ?MRN: [id number] ?Date of Birth: [birth year] ? ? ?Patient has met  of  long term goals due to improved activity tolerance, improved balance, postural control, ability to compensate for deficits, functional use of  LEFT upper and LEFT lower extremity, improved attention, improved awareness, and improved coordination.  Patient to discharge at overall Modified Independent level.  Patient's care partner is independent to provide the necessary cognitive assistance at discharge.  Wife was present for family training with education in seated ADL performance, functional mobility with RW, and recommendation to wait for further therapy prior to returning to wood carving and other high level outdoor activities ? ?Reasons goals not met: n/a ? ?Recommendation:  ?Patient will benefit from ongoing skilled OT services in home health setting to continue to advance functional skills in the area of iADL and outside IADLs . ? ?Equipment: ?No equipment provided ? ?Reasons for discharge: treatment goals met and discharge from hospital ? ?Patient/family agrees with progress made and goals achieved: Yes ? ?OT Discharge ?Precautions/Restrictions  ?Precautions ?Precautions: Fall ?Restrictions ?Weight Bearing Restrictions: No ?General ?OT Amount of Missed Time: 45 Minutes ?Vital Signs ? ?Pain ?Pain Assessment ?Pain Scale: 0-10 ?Pain Score: 0-No pain ?Faces Pain Scale: No hurt ?Multiple Pain Sites: No ?ADL ?ADL ?Eating: Modified independent ?Grooming: Modified independent ?Upper Body Bathing: Modified independent ?Where Assessed-Upper Body Bathing: Shower ?Lower Body Bathing: Modified independent ?Where Assessed-Lower Body Bathing: Shower ?Upper Body Dressing: Modified independent (Device) ?Where Assessed-Upper Body Dressing: Edge of bed ?Lower Body Dressing: Modified independent ?Where Assessed-Lower Body Dressing: Edge of bed ?Toileting: Modified independent ?Where  Assessed-Toileting: Toilet ?Toilet Transfer: Modified independent ?Toilet Transfer Method: Ambulating ?Toilet Transfer Equipment: Bedside commode ?Tub/Shower Transfer: Close supervison ?Tub/Shower Transfer Method: Ambulating ?Tub/Shower Equipment: Grab bars, Walk in shower, Shower seat with back ?Vision ?Baseline Vision/History: 0 No visual deficits ?Patient Visual Report: No change from baseline ?Vision Assessment?: Yes ?Eye Alignment: Within Functional Limits ?Ocular Range of Motion: Within Functional Limits ?Tracking/Visual Pursuits: Able to track stimulus in all quads without difficulty ?Saccades: Within functional limits ?Convergence: Within functional limits ?Visual Fields: No apparent deficits ?Perception  ?Perception: Within Functional Limits ?Praxis ?Praxis: Intact ?Cognition ?Cognition ?Arousal/Alertness: Awake/alert ?Orientation Level: Situation;Place;Person ?Selective Attention: Appears intact ?Divided Attention: Impaired ?Awareness: Appears intact ?Problem Solving: Impaired ?Safety/Judgment: Impaired ?Brief Interview for Mental Status (BIMS) ?Repetition of Three Words (First Attempt): 3 ?Temporal Orientation: Year: Correct ?Temporal Orientation: Month: Accurate within 5 days ?Temporal Orientation: Day: Correct ?Recall: "Sock": Yes, no cue required ?Recall: "Blue": Yes, no cue required ?Recall: "Bed": Yes, no cue required ?BIMS Summary Score: 15 ?Sensation ?Sensation ?Light Touch: Appears Intact ?Proprioception: Appears Intact ?Coordination ?Gross Motor Movements are Fluid and Coordinated: No ?Fine Motor Movements are Fluid and Coordinated: No ?Motor  ?Motor ?Motor: Hemiplegia;Motor impersistence ?Motor - Skilled Clinical Observations: mild L hemi ?Mobility  ?Transfers ?Sit to Stand: Independent with assistive device ?Stand to Sit: Independent with assistive device  ?Trunk/Postural Assessment  ?Cervical Assessment ?Cervical Assessment:  (head forward) ?Thoracic Assessment ?Thoracic Assessment:  (rounded  shoudlers) ?Lumbar Assessment ?Lumbar Assessment:  (post tilt) ?Postural Control ?Postural Control: Deficits on evaluation (impaired with divided attention/external distractions)  ?Balance ?Balance ?Balance Assessed: Yes ?Dynamic Sitting Balance ?Dynamic Sitting - Level of Assistance: 6: Modified independent (Device/Increase time) ?Static Standing Balance ?Static Standing - Level of Assistance: 6: Modified independent (Device/Increase time) ?Dynamic Standing Balance ?Dynamic Standing - Level of Assistance: 6: Modified independent (Device/Increase time) ?Extremity/Trunk Assessment ?RUE Assessment ?RUE Assessment: Within  Functional Limits ?LUE Assessment ?General Strength Comments: 4/5 grossly ?LUE Body System: Neuro ?Brunstrum levels for arm and hand: Arm;Hand ?Brunstrum level for arm: Stage V Relative Independence from Synergy ?Brunstrum level for hand: Stage VI Isolated joint movements ? ? ?Lowella Dell Pahola Dimmitt ?10/30/2021, 2:43 PM ?

## 2021-10-30 NOTE — Progress Notes (Signed)
Per order, Changed device settings for MRI to  ?DOO at 100 bpm  ?Will program device back to pre-MRI settings after completion of exam, and send transmission ?

## 2021-10-30 NOTE — Progress Notes (Signed)
?                                                       PROGRESS NOTE ? ? ?Subjective/Complaints: ?Had MRI cervical spine today and shows mutlilevel spinal stenosis- image quality limited by patient's motion.  ? ?ROS:  ?Pt denies SOB, abd pain, CP, N/V/C/D, and vision changes, +weakness ? ? ?Objective: ?  ?MR CERVICAL SPINE WO CONTRAST ? ?Result Date: 10/30/2021 ?CLINICAL DATA:  Lower extremity weakness.  Cervical spinal stenosis. EXAM: MRI CERVICAL SPINE WITHOUT CONTRAST TECHNIQUE: Multiplanar, multisequence MR imaging of the cervical spine was performed. No intravenous contrast was administered. COMPARISON:  CT cervical spine 10/24/2021 FINDINGS: Alignment: Image quality degraded by moderate motion. The patient was sedated for the study. Mild retrolisthesis C3-4 and C4-5.  Mild anterolisthesis C7-T1 Vertebrae: Negative for fracture or mass Cord: Spinal cord evaluation limited by significant motion. No cord signal abnormality identified Posterior Fossa, vertebral arteries, paraspinal tissues: Negative Disc levels: C2-3: Moderate to severe left foraminal encroachment due to spurring. Spinal canal adequate in size C3-4: Disc degeneration with diffuse uncinate spurring. Cord flattening with moderate spinal stenosis. Moderate to severe foraminal encroachment bilaterally. C4-5: Disc degeneration with diffuse uncinate spurring. Mild spinal stenosis. Severe foraminal stenosis bilaterally C5-6: Solid arthrodesis.  Mild foraminal narrowing bilaterally C6-7: Disc degeneration with diffuse uncinate spurring. Mild spinal stenosis. Moderate foraminal stenosis bilaterally C7-T1: Disc and facet degeneration. Mild to moderate spinal stenosis. 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 Electronically Signed   By: Franchot Gallo M.D.   On:  10/30/2021 14:38   ?Recent Labs  ?  10/28/21 ?0737  ?WBC 8.6  ?HGB 9.6*  ?HCT 30.8*  ?PLT 229  ? ? ?Recent Labs  ?  10/28/21 ?0737  ?NA 136  ?K 3.9  ?CL 101  ?CO2 27  ?GLUCOSE 205*  ?BUN 15  ?CREATININE 0.94  ?CALCIUM 8.4*  ? ? ? ?Intake/Output Summary (Last 24 hours) at 10/30/2021 1706 ?Last data filed at 10/30/2021 1316 ?Gross per 24 hour  ?Intake 354 ml  ?Output 600 ml  ?Net -246 ml  ?  ? ?  ? ?Physical Exam: ?Vital Signs ?Blood pressure (!) 116/58, pulse 72, temperature 97.8 ?F (36.6 ?C), temperature source Oral, resp. rate 18, height '5\' 9"'$  (1.753 m), weight 96.5 kg, SpO2 95 %. ?Gen: no distress, normal appearing ?HEENT: oral mucosa pink and moist, NCAT ?Cardio: Reg rate ?Chest: normal effort, normal rate of breathing ?Abd: soft, non-distended ?Ext: no edema ?Psych: pleasant, normal affect ? ?Skin: Clean and intact without signs of breakdown ?Neuro:  LUE 3 to 3+/5. LLE 3/5 prox to 4/5 distally--inconsistent effort. Sensory 1/2 LUE and LLE. DTR's 3+ in LE's and at least 2+ UE's.  Clonus in both ankles.-- ?Musculoskeletal: generalized joint tenderness with palpation.--no changes   ? ? ?Assessment/Plan: ?1. Functional deficits which require 3+ hours per day of interdisciplinary therapy in a comprehensive inpatient rehab setting. ?Physiatrist is providing close team supervision and 24 hour management of active medical problems listed below. ?Physiatrist and rehab team continue to assess barriers to discharge/monitor patient progress toward functional and medical goals ? ?Care Tool: ? ?Bathing ? Bathing activity did not occur: Refused (will do tomorrow) ?Body parts  bathed by patient: Right arm, Left arm, Chest, Abdomen, Front perineal area, Buttocks, Right upper leg, Left upper leg, Right lower leg, Left lower leg, Face  ?   ?  ?  ?Bathing assist Assist Level: Supervision/Verbal cueing ?  ?  ?Upper Body Dressing/Undressing ?Upper body dressing Upper body dressing/undressing activity did not occur (including orthotics):  Refused ?What is the patient wearing?: Pull over shirt ?   ?Upper body assist Assist Level: Supervision/Verbal cueing ?   ?Lower Body Dressing/Undressing ?Lower body dressing ? ? ? Lower body dressing activity did not occur: Refused ?What is the patient wearing?: Pants, Underwear/pull up ? ?  ? ?Lower body assist Assist for lower body dressing: Supervision/Verbal cueing ?   ? ?Toileting ?Toileting    ?Toileting assist Assist for toileting: Supervision/Verbal cueing ?  ?  ?Transfers ?Chair/bed transfer ? ?Transfers assist ? Chair/bed transfer activity did not occur: N/A ? ?Chair/bed transfer assist level: Supervision/Verbal cueing ?Chair/bed transfer assistive device: Walker ?  ?Locomotion ?Ambulation ? ? ?Ambulation assist ? ?   ? ?Assist level: Supervision/Verbal cueing ?Assistive device: Cane-straight ?Max distance: >164f  ? ?Walk 10 feet activity ? ? ?Assist ?   ? ?Assist level: Supervision/Verbal cueing ?Assistive device: Cane-straight  ? ?Walk 50 feet activity ? ? ?Assist   ? ?Assist level: Supervision/Verbal cueing ?Assistive device: Cane-straight  ? ? ?Walk 150 feet activity ? ? ?Assist   ? ?Assist level: Supervision/Verbal cueing ?Assistive device: Cane-straight ?  ? ?Walk 10 feet on uneven surface  ?activity ? ? ?Assist   ? ? ?Assist level: Contact Guard/Touching assist ?Assistive device: Cane-straight  ? ?Wheelchair ? ? ? ? ?Assist Is the patient using a wheelchair?: Yes ?Type of Wheelchair: Manual ?  ? ?Wheelchair assist level: Supervision/Verbal cueing ?Max wheelchair distance: >200 ft  ? ? ?Wheelchair 50 feet with 2 turns activity ? ? ? ?Assist ? ?  ?  ? ? ?Assist Level: Supervision/Verbal cueing  ? ?Wheelchair 150 feet activity  ? ? ? ?Assist ?   ? ? ?Assist Level: Supervision/Verbal cueing  ? ?Blood pressure (!) 116/58, pulse 72, temperature 97.8 ?F (36.6 ?C), temperature source Oral, resp. rate 18, height '5\' 9"'$  (1.753 m), weight 96.5 kg, SpO2 95 %. ? ?Medical Problem List and Plan: ?1. Functional  deficits secondary to right sided ischemic stroke and debility from recent Covid 19 infection ?            -patient may  shower ?            -ELOS/Goals: 7-10d Mod I ? -Continue CIR- PT, OT  ?2.  Antithrombotics: ?-DVT/anticoagulation:  Pharmaceutical: Other (comment)Eliquis ?            -antiplatelet therapy: none ?3. Pain: continue Tylenol, Tramadol prn ?4. Mood: LCSW to evaluate and provide emotional support ?            -antipsychotic agents: n/a ?5. Neuropsych: This patient is capable of making decisions on his own behalf. ?6. Skin/Wound Care: Routine skin care checks ?7. Fluids/Electrolytes/Nutrition: encourage PO   ?8: Hypertension: continue Imdur ? 4/7 bp controlled ? 4/10- BP controlled- con't regimen ?9: Chronic diastolic HF: euvolemic; continue to monitor ?            --need daily weights ?            --continue Lasix 40 mg daily and monitor K+ ? 4/9: weight steadily decreasing ? 4/11: weight stable, continue current dose ?Filed Weights  ? 10/29/21 0500 10/30/21 0533 10/30/21  1236  ?Weight: 96.7 kg 96.3 kg 96.5 kg  ?  ?10: Hyperlipidemia: continue Zetia (on Praluent at home) ?11: CAD: s/p CABG 2010; left heart cath 01/2021 ?--CP yesterday, 4/4: EKG and troponin negative ?            --NTG as needed ?12: Atrial fibrillation: continue Eliquis, Pacerone ?13: COPD: Continue Dulera ?            --albuterol nebs as needed ? -encouraged IS, OOB ?14: GERD: ?15: OSA: continue CPAP ?  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            --follow-up with Dr. 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  Pt with bilateral sustained clonus at the ankles 3+ DTR throughout ? 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  ? -cervical MRI reviewed and shows mutlilevel stenosis ? -Brain MRI is  stable.  ?18. Anxiety with MRI: Ativan ordered for prior to MRI  ?LOS: ?7 days ?A FACE TO FACE EVALUATION WAS PERFORMED ? ?Martha Clan P Shuan Statzer ?10/30/2021, 5:06 PM  ? ?  ?

## 2021-10-30 NOTE — Progress Notes (Signed)
Physical Therapy Session Note ? ?Patient Details  ?Name: [name] ?MRN: [id number] ?Date of Birth: [birth year] ? ?Today's Date: 10/30/2021 ?PT Individual Time: 0915-1000 ?PT Individual Time Calculation (min): 45 min  ? ?Short Term Goals: ?Week 1:  PT Short Term Goal 1 (Week 1): Patient will perform basic transfers with supervision consistently. ?PT Short Term Goal 2 (Week 1): Patient will ambulate with CGA >150 ft using LRAD. ?PT Short Term Goal 3 (Week 1): Patient will perform a standardized balance assessment. ? ?Skilled Therapeutic Interventions/Progress Updates:  ?   ?Patient sitting EOB upon PT arrival. Patient alert and agreeable to PT session. Patient denied pain during session. ? ?Therapeutic Activity: ?Transfers: Patient performed sit to/from stand x7 with mod I with and without SPC.  ? ?Gait Training:  ?Patient ambulated >80 feet x2 using SPC with supervision. Ambulated with decreased gait speed, decreased step length and height, decreased lateral hip activation in stance L>R, minimal forward trunk lean, and good use of visual scanning throughout. Provided verbal cues for erect posture and increased gluteal activation in stance. ?6 Min Walk Test:  ?Instructed patient to ambulate as quickly and as safely as possible for 6 minutes using LRAD. Patient was allowed to take standing rest breaks without stopping the test, but if the patient required a sitting rest break the clock would be stopped and the test would be over.  ?Results: 450 feet (137.2 meters, Avg speed 0.4 m/s) using a SPC with supervision. Results indicate that the patient has reduced endurance with ambulation compared to age matched norms.  ?Age Matched Norms: 61-79 yo M: 527 meters ?MDC: 58.21 meters (190.98 feet) or 50 meters ?(ANPTA Core Set of Outcome Measures for Adults with Neurologic Conditions, 2018) ? ?Neuromuscular Re-ed: ?Berg Balance Test ?Sit to Stand: Able to stand without using hands and stabilize independently ?Standing  Unsupported: Able to stand safely 2 minutes ?Sitting with Back Unsupported but Feet Supported on Floor or Stool: Able to sit safely and securely 2 minutes ?Stand to Sit: Sits safely with minimal use of hands ?Transfers: Able to transfer safely, minor use of hands ?Standing Unsupported with Eyes Closed: Able to stand 10 seconds safely ?Standing Ubsupported with Feet Together: Able to place feet together independently and stand 1 minute safely ?From Standing, Reach Forward with Outstretched Arm: Can reach confidently >25 cm (10") ?From Standing Position, Pick up Object from Floor: Able to pick up shoe safely and easily ?From Standing Position, Turn to Look Behind Over each Shoulder: Turn sideways only but maintains balance ?Turn 360 Degrees: Able to turn 360 degrees safely but slowly ?Standing Unsupported, Alternately Place Feet on Step/Stool: Able to stand independently and complete 8 steps >20 seconds ?Standing Unsupported, One Foot in Front: Able to plae foot ahead of the other independently and hold 30 seconds (both sides) ?Standing on One Leg: Able to lift leg independently and hold equal to or more than 3 seconds (5 sec on L 11 sec on R) ?Total Score: 48/56 ?Patient demonstrated increased fall risk noted by score of 48/56 on the [place] Scale, improved from 44/56 on 4/10.  ?<45/56 = fall risk, <42/56 = predictive of recurrent falls, <40/56 = 100% fall risk  ?>41 = independent, 21-40 = assistive device, 0-20 = wheelchair level  ?MDC 6.9 (4 pts 45-56, 5 pts 35-44, 7 pts 25-34) ?(ANPTA Core Set of Outcome Measures for Adults with Neurologic Conditions, 2018) ? ?Reviewed outcome assessments and results with patient. Recommended use of SPC for short distance giat, use  of RW or electric scooter for longer distances, patient in agreement. Patient reports that he was using a rollator in the house PTA and would like to trial this device prior to discharge.  ? ?Patient sitting EOB at end of session with breaks  locked, bed alarm set, and all needs within reach.  ? ?Therapy Documentation ?Precautions:  ?Precautions ?Precautions: Fall ?Restrictions ?Weight Bearing Restrictions: No ? ? ? ?Therapy/Group: Individual Therapy ? ?Doreene Burke PT, DPT ? ?10/30/2021, 12:06 PM  ?

## 2021-10-30 NOTE — Progress Notes (Signed)
Pt placed on CPAP for bedtime. RT will cont to monitor as needed. ?

## 2021-10-31 NOTE — Progress Notes (Signed)
Subjective: ?The patient is alert and pleasant.  He is looking forward to going home tomorrow. ? ?Objective: ?Vital signs in last 24 hours: ?Temp:  [98.2 ?F (36.8 ?C)-98.6 ?F (37 ?C)] 98.6 ?F (37 ?C) (04/13 0437) ?Pulse Rate:  [72-90] 90 (04/13 0437) ?Resp:  [16] 16 (04/13 0437) ?BP: (94-110)/(56-63) 110/63 (04/13 0437) ?SpO2:  [95 %-99 %] 99 % (04/13 0437) ?Weight:  [93 kg-96.5 kg] 93 kg (04/13 0437) ?Estimated body mass index is 30.28 kg/m? as calculated from the following: ?  Height as of this encounter: '5\' 9"'$  (1.753 m). ?  Weight as of this encounter: 93 kg. ? ? ?Intake/Output from previous day: ?04/12 0701 - 04/13 0700 ?In: 354 [P.O.:354] ?Out: 350 [Urine:350] ?Intake/Output this shift: ?No intake/output data recorded. ? ?Physical exam the patient is alert and pleasant.  He is moving all 4 extremities well. ? ?I reviewed the patient's cervical MRI.  He has some spondylosis and mild stenosis.  Nothing urgent. ? ?The patient's brain MRI is unremarkable. ? ?Lab Results: ?No results for input(s): WBC, HGB, HCT, PLT in the last 72 hours. ?BMET ?No results for input(s): NA, K, CL, CO2, GLUCOSE, BUN, CREATININE, CALCIUM in the last 72 hours. ? ?Studies/Results: ?MR CERVICAL SPINE WO CONTRAST ? ?Result Date: 10/30/2021 ?CLINICAL DATA:  Lower extremity weakness.  Cervical spinal stenosis. EXAM: MRI CERVICAL SPINE WITHOUT CONTRAST TECHNIQUE: Multiplanar, multisequence MR imaging of the cervical spine was performed. No intravenous contrast was administered. COMPARISON:  CT cervical spine 10/24/2021 FINDINGS: Alignment: Image quality degraded by moderate motion. The patient was sedated for the study. Mild retrolisthesis C3-4 and C4-5.  Mild anterolisthesis C7-T1 Vertebrae: Negative for fracture or mass Cord: Spinal cord evaluation limited by significant motion. No cord signal abnormality identified Posterior Fossa, vertebral arteries, paraspinal tissues: Negative Disc levels: C2-3: Moderate to severe left foraminal  encroachment due to spurring. Spinal canal adequate in size C3-4: Disc degeneration with diffuse uncinate spurring. Cord flattening with moderate spinal stenosis. Moderate to severe foraminal encroachment bilaterally. C4-5: Disc degeneration with diffuse uncinate spurring. Mild spinal stenosis. Severe foraminal stenosis bilaterally C5-6: Solid arthrodesis.  Mild foraminal narrowing bilaterally C6-7: Disc degeneration with diffuse uncinate spurring. Mild spinal stenosis. Moderate foraminal stenosis bilaterally C7-T1: Disc and facet degeneration. Mild to moderate spinal stenosis. 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 Electronically Signed   By: Franchot Gallo M.D.   On: 10/30/2021 14:38   ? ?Assessment/Plan: ?Hyperreflexia, clonus: The patient has some narrowing in his neck but nothing urgent.  I will sign off.  Please have him follow-up with me in the office in a month or 2 to go over the images.  Please call if I can be of further assistance. ? LOS: 8 days  ? ? ? ?Ophelia Charter ?10/31/2021, 10:55 AM ? ? ? ? ?Patient ID: [name], male   DOB: 1946/02/15, 76 y.o.   MRN: [id number] ? ?

## 2021-10-31 NOTE — Progress Notes (Signed)
Physical Therapy Session Note ? ?Patient Details  ?Name: [name] ?MRN: [id number] ?Date of Birth: [birth year] ? ?Today's Date: 10/31/2021 ?PT Individual Time: 7948-0165 ?PT Individual Time Calculation (min): 30 min  ? ?Short Term Goals: ?Week 1:  PT Short Term Goal 1 (Week 1): Patient will perform basic transfers with supervision consistently. ?PT Short Term Goal 2 (Week 1): Patient will ambulate with CGA >150 ft using LRAD. ?PT Short Term Goal 3 (Week 1): Patient will perform a standardized balance assessment. ?Week 2:    ?Week 3:    ? ?Skilled Therapeutic Interventions/Progress Updates:  ?   ?Pt initially supine, awake, agreeable.  Denies pain. ? ?Supine to sit mod I. ?Sit to stand mod I w/cane. ?Gait to commode and stands to void w/cane and supervision. ?Gait to sink and washes hands w/supervision.   ? ?Gait 153f on unit and to day room w/cane and supervision. ? ?Dynamic balance activities: ?Standing on foam w/and without cane w/supervision. ?Standing on cane and tossing beanbags to bucket placed at [phone]/supervision using cane ?Lateral lunges alternating L/R x 20 ?Figure 8 walk around cones spaced [id number] x 6 w/supervision ?Gait [id number] w/supervision to room.   ?Transfers to bed w/supervision. ?Pt left supine w/rails up x 3, alarm set, bed in lowest position, and needs in reach. ? ?Therapy Documentation ?Precautions:  ?Precautions ?Precautions: Fall ?Restrictions ?Weight Bearing Restrictions: No ?ts:   ? ? ? ?Therapy/Group: Individual Therapy ? ?BarbJerrilyn Cairo13/2023, 3:52 PM  ?

## 2021-10-31 NOTE — Progress Notes (Signed)
Physical Therapy Session Note ? ?Patient Details  ?Name: [name] ?MRN: [id number] ?Date of Birth: 12-03-1945 ? ?Today's Date: 10/31/2021 ?PT Individual Time: 8979-1504 ?PT Individual Time Calculation (min): 56 min  ? ?Short Term Goals: ?Week 1:  PT Short Term Goal 1 (Week 1): Patient will perform basic transfers with supervision consistently. ?PT Short Term Goal 2 (Week 1): Patient will ambulate with CGA >150 ft using LRAD. ?PT Short Term Goal 3 (Week 1): Patient will perform a standardized balance assessment. ? ?Skilled Therapeutic Interventions/Progress Updates:  ? ?Pt received supine in bed and agreeable to PT. Supine>sit transfer without assist  or cues. Pt performed upper and lower body dressing with set up assist only and increased time due to management of LLE. Gait training through hall x 273f, and 1096fwithout cues or assist From PT with SPC. Pt transported to entrance of hospital. Gait training with RW x 4087fith distant supervision assist with cues for improved step width to improve BOS.  ? ?PT performed grad day assessment to assess strength and coordination.  ? ?PT instructed pt in HEP with hand out provided:  ?Sit<>stand. Seated hip abduction. Standing hip abduction. Supine bridges. Each performed x 10 with level 3 tband for resistance exercises.    ?Pt performed sit<>stand without UE support throughout.  ? ?Patient returned to room and left sitting EOB with call bell in reach and all needs met.   ? ?   ? ?Therapy Documentation ?Precautions:  ?Precautions ?Precautions: Fall ?Restrictions ?Weight Bearing Restrictions: No ? ?  ?Pain: ?Pain Assessment ?Pain Scale: 0-10 ?Pain Score: 0-No pain ? ?Therapy/Group: Individual Therapy ? ?AusLorie Phenix/13/2023, 11:22 AM  ?

## 2021-10-31 NOTE — Progress Notes (Signed)
?                                                       PROGRESS NOTE ? ? ?Subjective/Complaints: ?Discussed Mri results with patient and his wife and Dr. Arnoldo Morale also reviewed with patient earlier today ?Discussed MRI brain findings of chronic microangiopathy ?Provided diet and exercise education ? ?ROS:  ?Pt denies SOB, abd pain, CP, N/V/C/D, and vision changes, +weakness- improving ? ? ?Objective: ?  ?MR CERVICAL SPINE WO CONTRAST ? ?Result Date: 10/30/2021 ?CLINICAL DATA:  Lower extremity weakness.  Cervical spinal stenosis. EXAM: MRI CERVICAL SPINE WITHOUT CONTRAST TECHNIQUE: Multiplanar, multisequence MR imaging of the cervical spine was performed. No intravenous contrast was administered. COMPARISON:  CT cervical spine 10/24/2021 FINDINGS: Alignment: Image quality degraded by moderate motion. The patient was sedated for the study. Mild retrolisthesis C3-4 and C4-5.  Mild anterolisthesis C7-T1 Vertebrae: Negative for fracture or mass Cord: Spinal cord evaluation limited by significant motion. No cord signal abnormality identified Posterior Fossa, vertebral arteries, paraspinal tissues: Negative Disc levels: C2-3: Moderate to severe left foraminal encroachment due to spurring. Spinal canal adequate in size C3-4: Disc degeneration with diffuse uncinate spurring. Cord flattening with moderate spinal stenosis. Moderate to severe foraminal encroachment bilaterally. C4-5: Disc degeneration with diffuse uncinate spurring. Mild spinal stenosis. Severe foraminal stenosis bilaterally C5-6: Solid arthrodesis.  Mild foraminal narrowing bilaterally C6-7: Disc degeneration with diffuse uncinate spurring. Mild spinal stenosis. Moderate foraminal stenosis bilaterally C7-T1: Disc and facet degeneration. Mild to moderate spinal stenosis. Moderate to severe foraminal stenosis bilaterally IMPRESSION: Despite sedation, image quality degraded by significant motion Multilevel spondylosis. Multilevel spinal and foraminal stenosis  throughout the cervical spine as described above. Electronically Signed   By: Franchot Gallo M.D.   On: 10/30/2021 14:38   ?No results for input(s): WBC, HGB, HCT, PLT in the last 72 hours. ? ? ?No results for input(s): NA, K, CL, CO2, GLUCOSE, BUN, CREATININE, CALCIUM in the last 72 hours. ? ? ? ?Intake/Output Summary (Last 24 hours) at 10/31/2021 1146 ?Last data filed at 10/31/2021 0438 ?Gross per 24 hour  ?Intake 118 ml  ?Output 350 ml  ?Net -232 ml  ?  ? ?  ? ?Physical Exam: ?Vital Signs ?Blood pressure 110/63, pulse 90, temperature 98.6 ?F (37 ?C), resp. rate 16, height '5\' 9"'$  (1.753 m), weight 93 kg, SpO2 99 %. ?Gen: no distress, normal appearing, BMI 30.28 ?HEENT: oral mucosa pink and moist, NCAT ?Cardio: Reg rate ?Chest: normal effort, normal rate of breathing ?Abd: soft, non-distended ?Ext: no edema ?Psych: pleasant, normal affect ? ?Skin: Clean and intact without signs of breakdown ?Neuro:  LUE 3 to 3+/5. LLE 3/5 prox to 4/5 distally--inconsistent effort. Sensory 1/2 LUE and LLE. DTR's 3+ in LE's and at least 2+ UE's.  Clonus in both ankles.-- ?Musculoskeletal: generalized joint tenderness with palpation.--no changes   ? ? ?Assessment/Plan: ?1. Functional deficits which require 3+ hours per day of interdisciplinary therapy in a comprehensive inpatient rehab setting. ?Physiatrist is providing close team supervision and 24 hour management of active medical problems listed below. ?Physiatrist and rehab team continue to assess barriers to discharge/monitor patient progress toward functional and medical goals ? ?Care Tool: ? ?Bathing ? Bathing activity did not occur: Refused (will do tomorrow) ?Body parts bathed by patient: Right arm, Left arm, Chest, Abdomen, Front perineal  area, Buttocks, Right upper leg, Left upper leg, Right lower leg, Left lower leg, Face  ?   ?  ?  ?Bathing assist Assist Level: Supervision/Verbal cueing ?  ?  ?Upper Body Dressing/Undressing ?Upper body dressing Upper body dressing/undressing  activity did not occur (including orthotics): Refused ?What is the patient wearing?: Pull over shirt ?   ?Upper body assist Assist Level: Supervision/Verbal cueing ?   ?Lower Body Dressing/Undressing ?Lower body dressing ? ? ? Lower body dressing activity did not occur: Refused ?What is the patient wearing?: Pants, Underwear/pull up ? ?  ? ?Lower body assist Assist for lower body dressing: Supervision/Verbal cueing ?   ? ?Toileting ?Toileting    ?Toileting assist Assist for toileting: Supervision/Verbal cueing ?  ?  ?Transfers ?Chair/bed transfer ? ?Transfers assist ? Chair/bed transfer activity did not occur: N/A ? ?Chair/bed transfer assist level: Supervision/Verbal cueing ?Chair/bed transfer assistive device: Walker ?  ?Locomotion ?Ambulation ? ? ?Ambulation assist ? ?   ? ?Assist level: Supervision/Verbal cueing ?Assistive device: Cane-straight ?Max distance: >167f  ? ?Walk 10 feet activity ? ? ?Assist ?   ? ?Assist level: Supervision/Verbal cueing ?Assistive device: Cane-straight  ? ?Walk 50 feet activity ? ? ?Assist   ? ?Assist level: Supervision/Verbal cueing ?Assistive device: Cane-straight  ? ? ?Walk 150 feet activity ? ? ?Assist   ? ?Assist level: Supervision/Verbal cueing ?Assistive device: Cane-straight ?  ? ?Walk 10 feet on uneven surface  ?activity ? ? ?Assist   ? ? ?Assist level: Contact Guard/Touching assist ?Assistive device: Cane-straight  ? ?Wheelchair ? ? ? ? ?Assist Is the patient using a wheelchair?: Yes ?Type of Wheelchair: Manual ?  ? ?Wheelchair assist level: Supervision/Verbal cueing ?Max wheelchair distance: >200 ft  ? ? ?Wheelchair 50 feet with 2 turns activity ? ? ? ?Assist ? ?  ?  ? ? ?Assist Level: Supervision/Verbal cueing  ? ?Wheelchair 150 feet activity  ? ? ? ?Assist ?   ? ? ?Assist Level: Supervision/Verbal cueing  ? ?Blood pressure 110/63, pulse 90, temperature 98.6 ?F (37 ?C), resp. rate 16, height '5\' 9"'$  (1.753 m), weight 93 kg, SpO2 99 %. ? ?Medical Problem List and Plan: ?1.  Functional deficits secondary to right sided ischemic stroke and debility from recent Covid 19 infection ?            -patient may  shower ?            -ELOS/Goals: 7-10d Mod I ? Continue CIR- PT, OT  ?2.  Antithrombotics: ?-DVT/anticoagulation:  Pharmaceutical: Other (comment)Eliquis ?            -antiplatelet therapy: none ?3. Pain: continue Tylenol, Tramadol prn ?4. Mood: LCSW to evaluate and provide emotional support ?            -antipsychotic agents: n/a ?5. Neuropsych: This patient is capable of making decisions on his own behalf. ?6. Skin/Wound Care: Routine skin care checks ?7. Fluids/Electrolytes/Nutrition: encourage PO   ?8: Hypertension: continue Imdur ? 4/7 bp controlled ? 4/10- BP controlled- con't regimen ?9: Chronic diastolic HF: euvolemic; continue to monitor ?            --need daily weights ?            --continue Lasix 40 mg daily and monitor K+ ? 4/9: weight steadily decreasing ? 4/11: weight stable, continue current dose ?Filed Weights  ? 10/30/21 0533 10/30/21 1236 10/31/21 0437  ?Weight: 96.3 kg 96.5 kg 93 kg  ?  ?10: Hyperlipidemia: continue  Zetia (on Praluent at home) ?11: CAD: s/p CABG 2010; left heart cath 01/2021 ?--CP yesterday, 4/4: EKG and troponin negative ?            --NTG as needed ? -provided dietary education ?12: Atrial fibrillation: continue Eliquis, Pacerone ?13: COPD: Continue Dulera ?            --albuterol nebs as needed ? -encouraged IS, OOB ?14: GERD: ?15: OSA: continue CPAP ?  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            --follow-up with Dr. 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  Pt with bilateral sustained clonus at the ankles 3+ DTR throughout ? 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  ? -cervical MRI reviewed and  shows mutlilevel stenosis, discussed with patient and wife today.  ?18. 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

## 2021-10-31 NOTE — Progress Notes (Addendum)
Speech Language Pathology Discharge Summary ? ?Patient Details  ?Name: [name] ?MRN: [id number] ?Date of Birth: [birth year] ? ?Today's Date: 10/31/2021 ?SLP Individual Time: 3112-1624 ?SLP Individual Time Calculation (min): 45 min ? ?Skilled Therapeutic Interventions:  Pt seen, this date, for skilled ST intervention targeting cognitive-linguistic goals outlined in care plan. Pt encountered alert/awake and sitting EOB. Finishing with PT. Spouse present. Pt agreeable to ST intervention in hospital room. Pt cooperative and participatory throughout. ? ?SLP facilitated today's session by providing compensatory strategy training r/t recall and problem-solving, as well as pt and family education re: need for supervision/assistance with pill organizer, upon discharge, to ensure accuracy. Pt recalled 2 precautions re: driving and yard work given [employer] from his wife, who cued pt appropriately. Recalled later following a delay with Mod I.  ? ?Discussed energy conservation r/t cognitive skills and need to think before making decisions quickly, particularly when fatigued; pt and pt's wife verbalized understanding. Pt completed basic, divided attention task (sorting playing cards by color and suite), while answering questions during conversation with 100% accuracy and Mod I. Pt's  ? ?Session concluded with pt at EOB, wife present (wife cleared to help with mobility in room), and pt eating lunch. All needs met and call bell within reach. Will discharge from [place] intervention; please see below for details. ? ?Patient has met 4 of 4 long term goals.  Patient to discharge at overall Supervision to Modified Independent level.  ?Reasons goals not met: N/A  ? ?Clinical Impression/Discharge Summary: Pt has demonstrated functional gains during CIR admission and has met all LTG's r/t short-term recall of novel information, complex daily problem solving, and basic, divided attention, as outlined in his care plan. Pt and family  education completed re: the need for supervision/assistance when pt is completing his pill organizer for the week (pt did this independently prior to admission), compensatory strategy training as it relates to memory and executive functioning, and education on planning/problem-solving for household safety. Pt and pt's wife appreciative of care and all education.  ? ?At this time, pt appears to be functioning at his cognitive baseline, which him and his wife endorse; therefore, OP/HH ST intervention does not appear indicated at this time. Upon discharge, recommend full supervision/assistance from spouse for iADL and ADL tasks to ensure safety and compliance with recommendations. ? ?Care Partner:  ?Caregiver Able to Provide Assistance: Yes  ?Type of Caregiver Assistance: Cognitive ? ?Recommendation:  ?None  ?Rationale for SLP Follow Up:  (N/A)  ? ?Equipment: N/A  ? ?Reasons for discharge: Discharged from hospital;Treatment goals met  ? ?Patient/Family Agrees with Progress Made and Goals Achieved: Yes  ? ? ?[name] ?10/31/2021, 2:49 PM ? ?

## 2021-10-31 NOTE — Discharge Instructions (Addendum)
Inpatient Rehab Discharge Instructions ? ?[name] ?Discharge date and time: 11/01/2021 ? ?Activities/Precautions/ Functional Status: ?Activity: no lifting, driving, or strenuous exercise for until cleared by MD ?Diet: cardiac diet ?Wound Care: none needed ?Functional status:  ?___ No restrictions     ___ Walk up steps independently ?___ 24/7 supervision/assistance   ___ Walk up steps with assistance ?_x__ Intermittent supervision/assistance  ___ Bathe/dress independently ?___ Walk with walker     ___ Bathe/dress with assistance ?___ Walk Independently    ___ Shower independently ?___ Walk with assistance    __x_ Shower with assistance ?_x__ No alcohol     ___ Return to work/school ________ ? ?Special Instructions: ? ?No driving, alcohol consumption or tobacco use.  ? ?STROKE/TIA DISCHARGE INSTRUCTIONS ?SMOKING Cigarette smoking nearly doubles your risk of having a stroke & is the single most alterable risk factor  ?If you smoke or have smoked in the last 12 months, you are advised to quit smoking for your health. Most of the excess cardiovascular risk related to smoking disappears within a year of stopping. ?Ask you doctor about anti-smoking medications ?[place] Quit Line: 1-800-QUIT NOW ?Free Smoking Cessation Classes [phone]  ?CHOLESTEROL Know your levels; limit fat & cholesterol in your diet  ?Lipid Panel  ?   ?Component Value Date/Time  ? CHOL 79 10/19/2021 0532  ? CHOL 113 05/18/2017 1048  ? CHOL 125 03/08/2014 0451  ? TRIG 77 10/19/2021 0532  ? TRIG 119 03/08/2014 0451  ? TRIG 95 08/01/2009 0000  ? HDL 37 (L) 10/19/2021 0532  ? HDL 47 05/18/2017 1048  ? HDL 31 (L) 03/08/2014 0451  ? CHOLHDL 2.1 10/19/2021 0532  ? VLDL 15 10/19/2021 0532  ? VLDL 24 03/08/2014 0451  ? Roy 27 10/19/2021 0532  ? LDLCALC 53 05/18/2017 1048  ? [place] 70 03/08/2014 0451  ? ? ? Many patients benefit from treatment even if their cholesterol is at goal. ?Goal: Total Cholesterol (CHOL) less than 160 ?Goal:  Triglycerides  (TRIG) less than 150 ?Goal:  HDL greater than 40 ?Goal:  LDL (LDLCALC) less than 100 ?  ?BLOOD PRESSURE American Stroke Association blood pressure target is less that 120/80 mm/Hg  ?Your discharge blood pressure is:  BP: 117/60 Monitor your blood pressure ?Limit your salt and alcohol intake ?Many individuals will require more than one medication for high blood pressure  ?DIABETES (A1c is a blood sugar average for last 3 months) Goal HGBA1c is under 7% (HBGA1c is blood sugar average for last 3 months)  ?Diabetes: ?No known diagnosis of diabetes   ? ?Lab Results  ?Component Value Date  ? HGBA1C 5.9 (H) 10/19/2021  ? ? Your HGBA1c can be lowered with medications, healthy diet, and exercise. ?Check your blood sugar as directed by your physician ?Call your physician if you experience unexplained or low blood sugars.  ?PHYSICAL ACTIVITY/REHABILITATION Goal is 30 minutes at least 4 days per week  ?Activity: Increase activity slowly, ?Therapies: Physical Therapy: Home Health, Occupational Therapy: Home Health, and Speech Therapy: Home Health ?Return to work: n/a Activity decreases your risk of heart attack and stroke and makes your heart stronger.  It helps control your weight and blood pressure; helps you relax and can improve your mood. ?Participate in a regular exercise program. ?Talk with your doctor about the best form of exercise for you (dancing, walking, swimming, cycling).  ?DIET/WEIGHT Goal is to maintain a healthy weight  ?Your discharge diet is:  ?Diet Order   ? ?       ?  Diet Heart Room service appropriate? Yes; Fluid consistency: Thin  Diet effective now       ?  ? ?  ?  ? ?  ? thin liquids ?Your height is:  Height: '5\' 9"'$  (175.3 cm) ?Your current weight is: Weight: 93.3 kg ?Your Body Mass Index (BMI) is:  BMI (Calculated): 30.36 Following the type of diet specifically designed for you will help prevent another stroke. ?Your goal weight range is:   ?Your goal Body Mass Index (BMI) is 19-24. ?Healthy food  habits can help reduce 3 risk factors for stroke:  High cholesterol, hypertension, and excess weight.  ?RESOURCES Stroke/Support Group:  Call [phone] ?  ?STROKE EDUCATION PROVIDED/REVIEWED AND GIVEN TO PATIENT Stroke warning signs and symptoms ?How to activate emergency medical system (call 911). ?Medications prescribed at discharge. ?Need for follow-up after discharge. ?Personal risk factors for stroke. ?Pneumonia vaccine given: No ?Flu vaccine given: No ?My questions have been answered, the writing is legible, and I understand these instructions.  I will adhere to these goals & educational materials that have been provided to me after my discharge from the hospital.  ? ?  ? ?[place]:   ? ?Home Health:   PT  OT  SP  RN   ?               Agency:[facility] DGLOV:[phone]  ? ?Medical Equipment/Items Ordered:HAS FROM PREVIOUS ADMISSIONS ?                                                Agency/Supplier:NA ? ? ? ?My questions have been answered and I understand these instructions. I will adhere to these goals and the provided educational materials after my discharge from the hospital. ? ?Patient/Caregiver Signature _______________________________ Date __________ ? ?Clinician Signature _______________________________________ Date __________ ? ?Please bring this form and your medication list with you to all your follow-up doctor's appointments.   ?

## 2021-10-31 NOTE — Progress Notes (Addendum)
Inpatient Rehabilitation Discharge Medication Review by a Pharmacist ? ?A complete drug regimen review was completed for this patient to identify any potential clinically significant medication issues. ? ?High Risk Drug Classes Is patient taking? Indication by Medication  ?Antipsychotic No   ?Anticoagulant Yes Eliquis - AF  ?Antibiotic No   ?Opioid Yes Tramadol - pain  ?Antiplatelet No   ?Hypoglycemics/insulin No   ?Vasoactive Medication Yes Pacerone - AF ?Nitrostat - angina  ?Lasix - HF diastolic ?Imdur - HTN  ?Chemotherapy No   ?Other Yes Zovirax - Herpes flare-ups ?Ventolin HFA, Symbicort, Spiriva Respimat - COPD ?Remeron - panic associated with PTSD ?Valium, Robaxin - muscle spasms ?Zofran - nausea ?Praluent - HF HLD ?Zetia - HLD ?Astelin, Zyrtec - allergies  ? ? ? ?Type of Medication Issue Identified Description of Issue Recommendation(s)  ?Drug Interaction(s) (clinically significant) ?    ?Duplicate Therapy ?    ?Allergy ?    ?No Medication Administration End Date ?    ?Incorrect Dose ?    ?Additional Drug Therapy Needed ?    ?Significant med changes from prior encounter (inform family/care partners about these prior to discharge).    ?Other ?    ? ? ?Clinically significant medication issues were identified that warrant physician communication and completion of prescribed/recommended actions by midnight of the next day:  No ? ?Time spent performing this drug regimen review (minutes):  30 ? ? ?Dani Anastasov ?11/01/2021 1:21 PM ? ?Agree with the contents of this note ? ?Mckynlee Luse BS, PharmD, BCPS ?Clinical Pharmacist ?11/01/2021 1:55 PM ? ?Contact: [phone] after 3 PM ? ?"Be curious, not judgmental..." -Jamal Maes ?

## 2021-10-31 NOTE — Progress Notes (Signed)
Occupational Therapy Session Note ? ?Patient Details  ?Name: [name] ?MRN: [id number] ?Date of Birth: 1945/09/09 ? ?Today's Date: 10/31/2021 ?OT Individual Time: 1000-1045 ?OT Individual Time Calculation (min): 45 min  ? ? ?Short Term Goals: ?Week 1:  OT Short Term Goal 1 (Week 1): Pt will complete full ADL with no more than min cuing for safety awareness ?OT Short Term Goal 2 (Week 1): Pt will improve LUE function on [facility] assessments by 20% ?OT Short Term Goal 3 (Week 1): Pt will completes functional transfers with LRAD and supervision ? ?Skilled Therapeutic Interventions/Progress Updates:  ?  Family Edu session: wife does not arrive till last 10  min of session. ? ?Pt completes MOD I BADL at ambulatory level with RW with good safety awareness, but reviewed with wife seated BADL recommendations and grab bar recommendations. Pt improved 9HPT with LUE to 30.5 seconds from 43 seconds. Pt wife provided handout with written information as stated below and has no further questions. Distant supervision recommended with pt and wife to practice hands on with PT later as well d/t time constraint. ? ?General mobility- they should always use their RW. They may benefit from a walker tray to transport items from room to room if walking with a walker is recommended by physical therapy. The walker should be kept within reach so they can pull it close to get up and keep with them to back up to any surface they want to sit on. When getting up, they should push up from the surface they are getting up from and reach back when sitting to a new surface, no plopping.  ?You are their ?shadow.? Especially in the beginning. You, as the helper should be in reach of the patient when mobilizing. You should be either beside or behind them so if they lose their balance you can assist by helping correct at the hips. This is likely closer than you are used to being- be in their personal space. Use a gait belt if that makes you feel more  comfortable ?If you are attempting to get up/transfer and it is not going well, reset. Have them sit back down. Make sure they are close to the edge of the seat, feet are underneath them at hips distance, and they are leaning forward to stand up. ?Bathing- they should sit to bathe on a shower chair, especially for washing legs/feet. Sitting will save energy and increase safety. ?For a tub shower with shower chair: Use the walker to walk up to shower/tub edge and leave it to the side, but close. they can use the wall to steady as they step over or a grab bar. Do your best to dry off the floor prior to getting out of the shower ?For tub shower with Tub bench: use walker to get to the edge of the tub bench, back up to the edge, reach back prior to sitting down. Turn to swing legs into tub and scoot across. Reverse to exit the tub. Make sure both legs are out of the tub prior to standing to exit the bathroom ?Walk in shower: walk up to the shower ledge, turn around and back up to the ledge with the walker. Keep both hands-on walker while they step back one foot at a time ?Dressing- all should be done from a SEATED level, especially to put underwear and pants over feet.  ?Toileting- the RW can be walked right over the toilet for standing urination if applicable. If seated toileting is more  appropriate, have them walk up to the toilet and keep walker with them as they turn to sit to toilet or BSC. Before they stand to pull up pants past hips they should pull pants/underwear up past their knees to decrease the need to bend forward to the floor. Sometimes this makes people dizzy ?if incontinence/bathroom accidents are an issue attempt to toilet every 2-3 hours to improve success with toileting and decrease accidents.  ?Energy conservation principles- ?Prioritize what needs to be done and what can be moved to another day ?Plan out their days, weeks, months to spread out taxing (physical or cognitively tiring) activities to  not put too much at one time ?Pace activities- rest before feeling tired and have designated places to rest if they feel tired and need to take a brake ?Position for success: sit when able to conserve 25% more energy than standing  ? ? ?Therapy Documentation ?Precautions:  ?Precautions ?Precautions: Fall ?Restrictions ?Weight Bearing Restrictions: No ?General: ?  ?Vital Signs: ?Therapy Vitals ?Temp: 98.6 ?F (37 ?C) ?Pulse Rate: 90 ?Resp: 16 ?BP: 110/63 ?Patient Position (if appropriate): Lying ?Oxygen Therapy ?SpO2: 99 % ?O2 Device: Room Air ?Pain: ?  ?ADL: ?ADL ?Eating: Modified independent ?Grooming: Modified independent ?Upper Body Bathing: Modified independent ?Where Assessed-Upper Body Bathing: Shower ?Lower Body Bathing: Modified independent ?Where Assessed-Lower Body Bathing: Shower ?Upper Body Dressing: Modified independent (Device) ?Where Assessed-Upper Body Dressing: Edge of bed ?Lower Body Dressing: Modified independent ?Where Assessed-Lower Body Dressing: Edge of bed ?Toileting: Modified independent ?Where Assessed-Toileting: Toilet ?Toilet Transfer: Modified independent ?Toilet Transfer Method: Ambulating ?Toilet Transfer Equipment: Bedside commode ?Tub/Shower Transfer: Close supervison ?Tub/Shower Transfer Method: Ambulating ?Tub/Shower Equipment: Grab bars, Walk in shower, Shower seat with back ?Vision ?Baseline Vision/History: 0 No visual deficits ?Patient Visual Report: No change from baseline ?Vision Assessment?: Yes ?Eye Alignment: Within Functional Limits ?Ocular Range of Motion: Within Functional Limits ?Alignment/Gaze Preference: Within Defined Limits ?Tracking/Visual Pursuits: Able to track stimulus in all quads without difficulty ?Saccades: Within functional limits ?Convergence: Within functional limits ?Visual Fields: No apparent deficits ?Perception  ?Perception: Within Functional Limits ?Praxis ?Praxis: Intact ?Balance ?Balance ?Balance Assessed: Yes ?Standardized Balance  Assessment ?Standardized Balance Assessment: Berg Balance Test ?[name] Balance Test ?Sit to Stand: Able to stand without using hands and stabilize independently ?Standing Unsupported: Able to stand safely 2 minutes ?Sitting with Back Unsupported but Feet Supported on Floor or Stool: Able to sit safely and securely 2 minutes ?Stand to Sit: Sits safely with minimal use of hands ?Transfers: Able to transfer safely, minor use of hands ?Standing Unsupported with Eyes Closed: Able to stand 10 seconds safely ?Standing Ubsupported with Feet Together: Able to place feet together independently and stand 1 minute safely ?From Standing, Reach Forward with Outstretched Arm: Can reach confidently >25 cm (10") ?From Standing Position, Pick up Object from Floor: Able to pick up shoe safely and easily ?From Standing Position, Turn to Look Behind Over each Shoulder: Turn sideways only but maintains balance ?Turn 360 Degrees: Able to turn 360 degrees safely but slowly ?Standing Unsupported, Alternately Place Feet on Step/Stool: Able to stand independently and complete 8 steps >20 seconds ?Standing Unsupported, One Foot in Front: Able to plae foot ahead of the other independently and hold 30 seconds (both sides) ?Standing on One Leg: Able to lift leg independently and hold equal to or more than 3 seconds (5 sec on L 11 sec on R) ?Total Score: 48 ?Dynamic Sitting Balance ?Dynamic Sitting - Level of Assistance: 6: Modified independent (Device/Increase time) ?  Static Standing Balance ?Static Standing - Level of Assistance: 6: Modified independent (Device/Increase time) ?Dynamic Standing Balance ?Dynamic Standing - Level of Assistance: 6: Modified independent (Device/Increase time) ?Exercises: ?  ?Other Treatments:   ? ? ?Therapy/Group: Individual Therapy ? ?Lowella Dell Tyesha Joffe ?10/31/2021, 6:40 AM ?

## 2021-10-31 NOTE — Plan of Care (Signed)
?  Problem: RH Problem Solving ?Goal: LTG Patient will demonstrate problem solving for (SLP) ?Description: LTG:  Patient will demonstrate problem solving for basic/complex daily situations with cues  (SLP) ?Outcome: Completed/Met ?Flowsheets (Taken 10/24/2021 1614 by Buzzy Han, CCC-SLP) ?LTG: Patient will demonstrate problem solving for (SLP): Complex daily situations ?LTG Patient will demonstrate problem solving for: Modified Independent ?  ?Problem: RH Memory ?Goal: LTG Patient will demonstrate ability for day to day (SLP) ?Description: LTG:   Patient will demonstrate ability for day to day recall/carryover during cognitive/linguistic activities with assist  (SLP) ?Outcome: Completed/Met ?Flowsheets (Taken 10/24/2021 1614 by Buzzy Han, CCC-SLP) ?LTG: Patient will demonstrate ability for day to day recall: New information ?LTG: Patient will demonstrate ability for day to day recall/carryover during cognitive/linguistic activities with assist (SLP): Supervision ?Goal: LTG Patient will use memory compensatory aids to (SLP) ?Description: LTG:  Patient will use memory compensatory aids to recall biographical/new, daily complex information with cues (SLP) ?Outcome: Completed/Met ?Flowsheets (Taken 10/24/2021 1614 by Buzzy Han, CCC-SLP) ?LTG: Patient will use memory compensatory aids to (SLP): Supervision ?  ?Problem: RH Attention ?Goal: LTG Patient will demonstrate this level of attention during functional activites (SLP) ?Description: LTG:  Patient will will demonstrate this level of attention during functional activites (SLP) ?Outcome: Completed/Met ?Flowsheets (Taken 10/24/2021 1614 by Buzzy Han, CCC-SLP) ?Patient will demonstrate during cognitive/linguistic activities the attention type of: Divided ?LTG: Patient will demonstrate this level of attention during cognitive/linguistic activities with assistance of (SLP): Modified Independent ?  ?

## 2021-10-31 NOTE — Progress Notes (Signed)
Inpatient Rehabilitation Care Coordinator ?Discharge Note  ? ?Patient Details  ?Name: [name] ?MRN: [id number] ?Date of Birth: 09-Jun-1946 ? ? ?Discharge location: [place] TO PROVIDE 24/7 CARE ? ?Length of Stay: 9 days ? ?Discharge activity level: SUPERVISION-MOD/I LEVEL ? ?Home/community participation: ACTIVE ? ?Patient response VO:HYWVPX Literacy - How often do you need to have someone help you when you read instructions, pamphlets, or other written material from your doctor or pharmacy?: Never ? ?Patient response TG:GYIRSW Isolation - How often do you feel lonely or isolated from those around you?: Never ? ?Services provided included: MD, RD, PT, OT, SLP, RN, CM, TR, Pharmacy, SW ? ?Financial Services:  ?Charity fundraiser Utilized: Private Insurance ?[employer] MEDICARE ? ?Choices offered to/list presented to: PT AND WIFE ? ?Follow-up services arranged:  ?Home Health, Patient/Family has no preference for HH/DME agencies ?Home Health Agency: [facility]  ?  ?  ?  ? ?Patient response to transportation need: ?Is the patient able to respond to transportation needs?: Yes ?In the past 12 months, has lack of transportation kept you from medical appointments or from getting medications?: No ?In the past 12 months, has lack of transportation kept you from meetings, work, or from getting things needed for daily living?: No ? ? ? ?Comments (or additional information):WIFE WAS IN FOR EDUCATION AND COMFORTABLE WITH HUSBAND'S CARE NEEDS. PT READY TO GO HOME ? ?Patient/Family verbalized understanding of follow-up arrangements:  Yes ? ?Individual responsible for coordination of the follow-up plan: Noreene Larsson [phone] ? ?Confirmed correct DME delivered: Elease Hashimoto 10/31/2021   ? ?Elease Hashimoto ?

## 2021-10-31 NOTE — Progress Notes (Signed)
Physical Therapy Discharge Summary ? ?Patient Details  ?Name: [name] ?MRN: [id number] ?Date of Birth: 02-09-1946 ? ?Today's Date: 10/31/2021 ?PT Individual Time: 6767-2094 ?PT Individual Time Calculation (min): 30 min  ? ? ?Patient has met 8 of 8 long term goals due to improved activity tolerance, improved balance, improved postural control, increased strength, decreased pain, ability to compensate for deficits, and improved coordination.  Patient to discharge at an ambulatory level Supervision-Modified Independent using a SPC.   Patient's care partner is independent to provide the necessary physical assistance at discharge. ? ?Reasons goals not met: All PT goals met. ? ?Recommendation:  ?Patient will benefit from ongoing skilled PT services in home health setting to continue to advance safe functional mobility, address ongoing impairments in balance, activity tolerance, functional mobility, gait and stair training, strengthening, community integration, patient/caregiver education, and minimize fall risk. ? ?Equipment: ?Recommending use of SPC for short distance ambulation, RW or electric scooter for community distances ? ?Reasons for discharge: treatment goals met ? ?Patient/family agrees with progress made and goals achieved: Yes ? ?Skilled Therapeutic Intervention: ?Patient sitting EOB with his wife in the room upon PT arrival. Patient alert and agreeable to PT session. Patient denied pain during session, reports baseline joint stiffness, declined interventions at this time. ? ?Patient's wife present for family education and hands on training throughout session. Performed safe guarding with all mobility following PT cues and/or demonstration. Educated on fall risk/prevention, home modifications to prevent falls, and activation of emergency services in the event of a fall during session.  ? ?Therapeutic Activity: ?Bed Mobility: Patient performed rolling R/L supine to/from sit independently in a flat bed  without use of bed rails.  ?Transfers: Patient performed sit to/from stand from various household surfaces with mod I using SPC.  ?Patient performed a simulated midsize SUV height car transfer with mod I using SPC. Patient able to teach back safe technique. ? ?Gait Training:  ?Patient ambulated >250 feet x2 with supervision-mod I and >800 feet to/from the [facility] entrance over level tile, unlevel sidewalk, grass, and mulch using SPC with supervision-CGA. Ambulated as described below. ?Patient ascended/descended 6 steps using SPC and 1 rail with close supervision. Performed step-to gait pattern leading with R while ascending and L while descending. Provided min cues for technique and sequencing.  ?Patient ambulated up/down a ramp, over 10 feet of mulch (unlevel surface), and up/down a curb to simulate community ambulation over unlevel surfaces with supervision using SPC. Provided cues for technique and use of AD. ? ?Patient sitting EOB handed off to SLP for continued family education at end of session. ? ?PT Discharge ?Precautions/Restrictions ?Precautions ?Precautions: Fall ?Restrictions ?Weight Bearing Restrictions: No ?Pain Interference ?Pain Interference ?Pain Effect on Sleep: 1. Rarely or not at all ?Pain Interference with Therapy Activities: 1. Rarely or not at all ?Pain Interference with Day-to-Day Activities: 1. Rarely or not at all ?Vision/Perception  ?Vision - History ?Ability to See in Adequate Light: 0 Adequate ?Vision - Assessment ?Eye Alignment: Within Functional Limits ?Ocular Range of Motion: Within Functional Limits ?Alignment/Gaze Preference: Within Defined Limits ?Tracking/Visual Pursuits: Able to track stimulus in all quads without difficulty ?Saccades: Within functional limits ?Convergence: Within functional limits ?Perception ?Perception: Within Functional Limits ?Praxis ?Praxis: Intact  ?Cognition ?Overall Cognitive Status: Impaired/Different from baseline ?Arousal/Alertness:  Awake/alert ?Orientation Level: Oriented X4 ?Attention: Divided ?Selective Attention: Appears intact ?Divided Attention: Impaired ?Divided Attention Impairment: Functional complex;Verbal complex ?Awareness: Appears intact ?Safety/Judgment: Impaired ?Comments: Safety/judgment poor at baseline, overestimates abilities ?Sensation ?Sensation ?Light Touch:  Impaired Detail ?Central sensation comments: mild parasthesia LLE<LUE ?Light Touch Impaired Details: Impaired LUE;Impaired LLE ?Coordination ?Gross Motor Movements are Fluid and Coordinated: No ?Fine Motor Movements are Fluid and Coordinated: No ?Finger Nose Finger Test: mild tremor in the LUE, RUE WFL ?Heel Shin Test: mild decreased ROM in BLE. symmetrical R and L. ?Motor  ?Motor ?Motor: Hemiplegia;Motor impersistence ?Motor - Discharge Observations: mild L hemi  ?Mobility ?Bed Mobility ?Rolling Left: Independent ?Supine to Sit: Independent ?Sit to Supine: Independent ?Transfers ?Sit to Stand: Independent with assistive device ?Stand to Sit: Independent with assistive device ?Stand Pivot Transfers: Independent with assistive device ?Transfer (Assistive device): Straight cane ?Locomotion  ?Gait ?Ambulation: Yes ?Gait Assistance: Supervision/Verbal cueing ?Gait Distance (Feet): 606 Feet (on 6MWT with RW, 450 ft on 6MWT with SPC) ?Assistive device: Rolling walker;Straight cane ?Gait Assistance Details: Verbal cues for precautions/safety;Verbal cues for technique ?Gait ?Gait: Yes ?Gait Pattern: Impaired ?Gait Pattern: Step-through pattern;Decreased stride length;Lateral hip instability;Trendelenburg;Wide base of support;Trunk flexed;Decreased trunk rotation ?Gait velocity: decreased, 0.5 m/s with RW, 0.4 m/s with SPC ?Stairs / Additional Locomotion ?Stairs: Yes ?Stairs Assistance: Supervision/Verbal cueing ?Stair Management Technique: Two rails ?Height of Stairs: 6 ?Ramp: Supervision/Verbal cueing ?Curb: Supervision/Verbal cueing ?Wheelchair Mobility ?Wheelchair Mobility:  No  ?Trunk/Postural Assessment  ?Cervical Assessment ?Cervical Assessment:  (head forward) ?Thoracic Assessment ?Thoracic Assessment:  (rounded shoudlers) ?Lumbar Assessment ?Lumbar Assessment:  (post tilt) ?Postural Control ?Postural Control: Deficits on evaluation (impaired with divided attention/external distractions)  ?Balance ?Balance ?Balance Assessed: Yes ?Standardized Balance Assessment ?Standardized Balance Assessment: Berg Balance Test ?[name] Balance Test ?Sit to Stand: Able to stand without using hands and stabilize independently ?Standing Unsupported: Able to stand safely 2 minutes ?Sitting with Back Unsupported but Feet Supported on Floor or Stool: Able to sit safely and securely 2 minutes ?Stand to Sit: Sits safely with minimal use of hands ?Transfers: Able to transfer safely, minor use of hands ?Standing Unsupported with Eyes Closed: Able to stand 10 seconds safely ?Standing Ubsupported with Feet Together: Able to place feet together independently and stand 1 minute safely ?From Standing, Reach Forward with Outstretched Arm: Can reach confidently >25 cm (10") ?From Standing Position, Pick up Object from Floor: Able to pick up shoe safely and easily ?From Standing Position, Turn to Look Behind Over each Shoulder: Turn sideways only but maintains balance ?Turn 360 Degrees: Able to turn 360 degrees safely but slowly ?Standing Unsupported, Alternately Place Feet on Step/Stool: Able to stand independently and complete 8 steps >20 seconds ?Standing Unsupported, One Foot in Front: Able to plae foot ahead of the other independently and hold 30 seconds (both sides) ?Standing on One Leg: Able to lift leg independently and hold equal to or more than 3 seconds (5 sec on L 11 sec on R) ?Total Score: 48 ?Dynamic Sitting Balance ?Dynamic Sitting - Level of Assistance: 6: Modified independent (Device/Increase time) ?Static Standing Balance ?Static Standing - Level of Assistance: 6: Modified independent  (Device/Increase time) ?Dynamic Standing Balance ?Dynamic Standing - Level of Assistance: 6: Modified independent (Device/Increase time) ?Extremity Assessment  ?RLE Assessment ?RLE Assessment: Within Functional Limits ?General Strength Comm

## 2021-11-01 ENCOUNTER — Other Ambulatory Visit: Payer: Self-pay | Admitting: Family Medicine

## 2021-11-01 DIAGNOSIS — J310 Chronic rhinitis: Secondary | ICD-10-CM

## 2021-11-01 MED ORDER — ACETAMINOPHEN 325 MG PO TABS: 325.0000 mg | ORAL_TABLET | ORAL | Status: AC | PRN

## 2021-11-01 MED ORDER — TRAMADOL HCL 50 MG PO TABS: 50.0000 mg | ORAL_TABLET | Freq: Two times a day (BID) | ORAL | 0 refills | Status: DC | PRN

## 2021-11-01 MED ORDER — FUROSEMIDE 40 MG PO TABS: 40.0000 mg | ORAL_TABLET | Freq: Every day | ORAL | 0 refills | Status: DC

## 2021-11-01 MED ORDER — ALBUTEROL SULFATE HFA 108 (90 BASE) MCG/ACT IN AERS: 2.0000 | INHALATION_SPRAY | Freq: Four times a day (QID) | RESPIRATORY_TRACT | 0 refills | Status: AC | PRN

## 2021-11-01 MED ORDER — AZELASTINE HCL 0.1 % NA SOLN: 2.0000 | Freq: Two times a day (BID) | NASAL | 1 refills | Status: DC

## 2021-11-01 NOTE — Progress Notes (Signed)
?                                                       PROGRESS NOTE ? ? ?Subjective/Complaints: ?Stable for d/c today ?Provided with list of health foods for home ?No new complaints ?Discussed d/c plan ? ?ROS:  ?Pt denies SOB, abd pain, CP, N/V/C/D, and vision changes, +weakness- improving ? ? ?Objective: ?  ?MR CERVICAL SPINE WO CONTRAST ? ?Result Date: 10/30/2021 ?CLINICAL DATA:  Lower extremity weakness.  Cervical spinal stenosis. EXAM: MRI CERVICAL SPINE WITHOUT CONTRAST TECHNIQUE: Multiplanar, multisequence MR imaging of the cervical spine was performed. No intravenous contrast was administered. COMPARISON:  CT cervical spine 10/24/2021 FINDINGS: Alignment: Image quality degraded by moderate motion. The patient was sedated for the study. Mild retrolisthesis C3-4 and C4-5.  Mild anterolisthesis C7-T1 Vertebrae: Negative for fracture or mass Cord: Spinal cord evaluation limited by significant motion. No cord signal abnormality identified Posterior Fossa, vertebral arteries, paraspinal tissues: Negative Disc levels: C2-3: Moderate to severe left foraminal encroachment due to spurring. Spinal canal adequate in size C3-4: Disc degeneration with diffuse uncinate spurring. Cord flattening with moderate spinal stenosis. Moderate to severe foraminal encroachment bilaterally. C4-5: Disc degeneration with diffuse uncinate spurring. Mild spinal stenosis. Severe foraminal stenosis bilaterally C5-6: Solid arthrodesis.  Mild foraminal narrowing bilaterally C6-7: Disc degeneration with diffuse uncinate spurring. Mild spinal stenosis. Moderate foraminal stenosis bilaterally C7-T1: Disc and facet degeneration. Mild to moderate spinal stenosis. 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 Electronically Signed   By: Franchot Gallo M.D.    On: 10/30/2021 14:38   ?No results for input(s): WBC, HGB, HCT, PLT in the last 72 hours. ? ? ?No results for input(s): NA, K, CL, CO2, GLUCOSE, BUN, CREATININE, CALCIUM in the last 72 hours. ? ? ? ?Intake/Output Summary (Last 24 hours) at 11/01/2021 1128 ?Last data filed at 11/01/2021 0724 ?Gross per 24 hour  ?Intake 600 ml  ?Output --  ?Net 600 ml  ?  ? ?  ? ?Physical Exam: ?Vital Signs ?Blood pressure 117/60, pulse 79, temperature 98.2 ?F (36.8 ?C), temperature source Oral, resp. rate 18, height '5\' 9"'$  (1.753 m), weight 93.3 kg, SpO2 96 %. ?Gen: no distress, normal appearing ?HEENT: oral mucosa pink and moist, NCAT ?Cardio: Reg rate ?Chest: normal effort, normal rate of breathing ?Abd: soft, non-distended ?Ext: no edema ?Psych: pleasant, normal affect ? ?Skin: Clean and intact without signs of breakdown ?Neuro:  LUE 3 to 3+/5. LLE 3/5 prox to 4/5 distally--inconsistent effort. Sensory 1/2 LUE and LLE. DTR's 3+ in LE's and at least 2+ UE's.  Clonus in both ankles.-- ?Musculoskeletal: generalized joint tenderness with palpation.--no changes   ? ? ?Assessment/Plan: ?1. Functional deficits which require 3+ hours per day of interdisciplinary therapy in a comprehensive inpatient rehab setting. ?Physiatrist is providing close team supervision and 24 hour management of active medical problems listed below. ?Physiatrist and rehab team continue to assess barriers to discharge/monitor patient progress toward functional and medical goals ? ?Care Tool: ? ?Bathing ? Bathing activity did not occur: Refused (will do tomorrow) ?Body parts bathed by patient: Right arm, Left arm, Chest, Abdomen, Front perineal area, Buttocks, Right upper leg, Left upper leg, Right lower leg,  Left lower leg, Face  ?   ?  ?  ?Bathing assist Assist Level: Supervision/Verbal cueing ?  ?  ?Upper Body Dressing/Undressing ?Upper body dressing Upper body dressing/undressing activity did not occur (including orthotics): Refused ?What is the patient wearing?:  Pull over shirt ?   ?Upper body assist Assist Level: Independent ?   ?Lower Body Dressing/Undressing ?Lower body dressing ? ? ? Lower body dressing activity did not occur: Refused ?What is the patient wearing?: Pants, Underwear/pull up ? ?  ? ?Lower body assist Assist for lower body dressing: Independent ?   ? ?Toileting ?Toileting    ?Toileting assist Assist for toileting: Independent ?  ?  ?Transfers ?Chair/bed transfer ? ?Transfers assist ? Chair/bed transfer activity did not occur: N/A ? ?Chair/bed transfer assist level: Independent with assistive device ?Chair/bed transfer assistive device: Cane ?  ?Locomotion ?Ambulation ? ? ?Ambulation assist ? ?   ? ?Assist level: Supervision/Verbal cueing ?Assistive device: Cane-straight ?Max distance: >1000 ft  ? ?Walk 10 feet activity ? ? ?Assist ?   ? ?Assist level: Independent with assistive device ?Assistive device: Cane-straight  ? ?Walk 50 feet activity ? ? ?Assist   ? ?Assist level: Independent with assistive device ?Assistive device: Cane-straight  ? ? ?Walk 150 feet activity ? ? ?Assist   ? ?Assist level: Supervision/Verbal cueing ?Assistive device: Cane-straight ?  ? ?Walk 10 feet on uneven surface  ?activity ? ? ?Assist   ? ? ?Assist level: Supervision/Verbal cueing ?Assistive device: Cane-straight  ? ?Wheelchair ? ? ? ? ?Assist Is the patient using a wheelchair?: No ?Type of Wheelchair: Manual ?  ? ?Wheelchair assist level: Supervision/Verbal cueing ?Max wheelchair distance: >200 ft  ? ? ?Wheelchair 50 feet with 2 turns activity ? ? ? ?Assist ? ?  ?  ? ? ?Assist Level: Supervision/Verbal cueing  ? ?Wheelchair 150 feet activity  ? ? ? ?Assist ?   ? ? ?Assist Level: Supervision/Verbal cueing  ? ?Blood pressure 117/60, pulse 79, temperature 98.2 ?F (36.8 ?C), temperature source Oral, resp. rate 18, height '5\' 9"'$  (1.753 m), weight 93.3 kg, SpO2 96 %. ? ?Medical Problem List and Plan: ?1. Functional deficits secondary to right sided ischemic stroke and debility from  recent Covid 19 infection ?            -patient may  shower ?            -ELOS/Goals: 7-10d Mod I ? D/c home ?2.  Antithrombotics: ?-DVT/anticoagulation:  Pharmaceutical: Other (comment)Eliquis ?            -antiplatelet therapy: none ?3. Pain: continue Tylenol, Tramadol prn ?4. Mood: LCSW to evaluate and provide emotional support ?            -antipsychotic agents: n/a ?5. Neuropsych: This patient is capable of making decisions on his own behalf. ?6. Skin/Wound Care: Routine skin care checks ?7. Fluids/Electrolytes/Nutrition: encourage PO   ?8: Hypertension: continue Imdur ? 4/7 bp controlled ? 4/10- BP controlled- con't regimen ?9: Chronic diastolic HF: euvolemic; continue to monitor ?            --need daily weights ?            --continue Lasix 40 mg daily and monitor K+ ? 4/9: weight steadily decreasing ? 4/11: weight stable, continue current dose ?Filed Weights  ? 10/30/21 1236 10/31/21 0437 11/01/21 0500  ?Weight: 96.5 kg 93 kg 93.3 kg  ?  ?10: Hyperlipidemia: continue Zetia (on Praluent at home) ?11: CAD: s/p  CABG 2010; left heart cath 01/2021 ?--CP yesterday, 4/4: EKG and troponin negative ?            --NTG as needed ? -provided dietary education ?12: Atrial fibrillation: continue Eliquis, Pacerone ?13: COPD: Continue Dulera ?            --albuterol nebs as needed ? -encouraged IS, OOB ?14: GERD: ?15: OSA: continue CPAP ?  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            --follow-up with Dr. 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  Pt with bilateral sustained clonus at the ankles 3+ DTR throughout ? 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  ? -cervical MRI reviewed and shows mutlilevel stenosis, discussed with patient and wife today.  ?18. 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 ? ? >30 minutes spent in discharge of patient including review of med

## 2021-11-04 ENCOUNTER — Telehealth: Payer: Self-pay | Admitting: *Deleted

## 2021-11-04 NOTE — Telephone Encounter (Signed)
Patient called asking to make an appointment now that he is out of the hospital and back home. ?

## 2021-11-06 ENCOUNTER — Encounter: Payer: Self-pay | Admitting: Internal Medicine

## 2021-11-07 ENCOUNTER — Telehealth: Payer: Self-pay | Admitting: Family Medicine

## 2021-11-07 NOTE — Telephone Encounter (Signed)
Patient called stating he is out of the hospital and to call him if he needs and appointment. ?

## 2021-11-07 NOTE — Telephone Encounter (Signed)
I called the patient and he is scheduled for a hospital follow up.  Edee Nifong,cma  ?

## 2021-11-11 ENCOUNTER — Ambulatory Visit (INDEPENDENT_AMBULATORY_CARE_PROVIDER_SITE_OTHER): Payer: Medicare HMO | Admitting: Family Medicine

## 2021-11-11 ENCOUNTER — Other Ambulatory Visit (HOSPITAL_COMMUNITY): Payer: Self-pay

## 2021-11-11 ENCOUNTER — Encounter: Payer: Self-pay | Admitting: Family Medicine

## 2021-11-11 DIAGNOSIS — R531 Weakness: Secondary | ICD-10-CM | POA: Diagnosis not present

## 2021-11-11 DIAGNOSIS — R221 Localized swelling, mass and lump, neck: Secondary | ICD-10-CM | POA: Diagnosis not present

## 2021-11-11 NOTE — Patient Instructions (Signed)
Nice to see you. ?Please see oncology and neurosurgery as planned.  ?If the area in your neck worsens prior to seeing oncology please let me know right away.  ?

## 2021-11-11 NOTE — Assessment & Plan Note (Signed)
Undetermined cause.  There is an area of tenderness in his left neck with a somewhat palpable nodule noted.  Discussed that this could be related to his cancer history or could be related to some other issue.  He is not having any other associated symptoms so it would be odd for this to be related to infection.  Advised that he should monitor at this time and see his oncology physician as planned.  If it worsens at all before he sees his oncologist he will let me know right away. ?

## 2021-11-11 NOTE — Assessment & Plan Note (Signed)
Significantly improved.  Certainly this could have been related to a stroke that was not picked up on MRI or could be related to significant nerve impingement in his cervical spine.  He will continue physical therapy.  He will see the neurosurgeon as planned.  He can continue Tylenol over-the-counter as needed for pain. ?

## 2021-11-11 NOTE — Progress Notes (Signed)
?[name], MD ?Phone: [phone] ? ?[name] is a 76 y.o. male who presents today for follow-up. ? ?Left-sided weakness: Patient was evaluated in the hospital for this.  He initially underwent CT head with no evidence of stroke.  He also had CT angiogram neck and a CT cervical spine.  The CT angiogram did not reveal any intracranial large vessel occlusion or significant stenosis.  It did reveal moderate stenosis at the origin of the left vertebral artery though otherwise no hemodynamically significant stenosis in the neck.  Eventually they were able to get clearance to do MRIs with his pacemaker and his MRI brain was negative for stroke though his MRI cervical spine did reveal degenerative changes.  Neurosurgery evaluated him and the patient notes they advised that it could have been nerve impingement related.  He also notes the physicians in the hospital noted that this could have been related to his prior COVID infection.  He was discharged from the hospital to CIR.  He noted progressive improvement there and was discharged.  He notes his weakness is much improved.  He notes his speech has returned to baseline.  He does have pain in his left arm and leg for which Tylenol is helpful.  He is doing home health physical therapy and Occupational Therapy twice weekly.  He has follow-up with neurosurgery later this week. ? ?Neck nodule: Patient notes over the last day or 2 he has noted a knot in his left neck that is somewhat tender.  He notes no sore throat or congestion.  No ear pain.  He sees his oncologist on Monday and does have a history of CLL. ? ?Social History  ? ?Tobacco Use  ?Smoking Status Former  ? Packs/day: 1.00  ? Years: 40.00  ? Pack years: 40.00  ? Types: Cigarettes  ? Quit date: 07/21/2006  ? Years since quitting: 15.3  ?Smokeless Tobacco Never  ? ? ?Current Outpatient Medications on File Prior to Visit  ?Medication Sig Dispense Refill  ? acetaminophen (TYLENOL) 325 MG tablet Take 1-2  tablets (325-650 mg total) by mouth every 4 (four) hours as needed for mild pain.    ? acyclovir (ZOVIRAX) 400 MG tablet TAKE 1 TABLET(400 MG) BY MOUTH TWICE DAILY 60 tablet 6  ? albuterol (VENTOLIN HFA) 108 (90 Base) MCG/ACT inhaler Inhale 2 puffs into the lungs every 6 (six) hours as needed for wheezing or shortness of breath. 1 each 0  ? Alirocumab (PRALUENT) 150 MG/ML SOAJ Inject 150 mg into the skin every 14 (fourteen) days. 2 mL 12  ? amiodarone (PACERONE) 200 MG tablet Take 1/2 tablet (100 mg) by mouth twice daily 5 days a week 60 tablet 6  ? apixaban (ELIQUIS) 5 MG TABS tablet Take 5 mg by mouth 2 (two) times daily.    ? azelastine (ASTELIN) 0.1 % nasal spray Place 2 sprays into both nostrils 2 (two) times daily. Use in each nostril as directed 30 mL 1  ? budesonide-formoterol (SYMBICORT) 160-4.5 MCG/ACT inhaler Inhale 2 puffs into the lungs 2 (two) times daily. 1 Inhaler 11  ? cetirizine (ZYRTEC) 10 MG tablet Take 10 mg by mouth daily as needed for allergies.     ? ezetimibe (ZETIA) 10 MG tablet Take 1 tablet (10 mg total) by mouth daily. 90 tablet 3  ? furosemide (LASIX) 40 MG tablet Take 1 tablet (40 mg total) by mouth daily. 30 tablet 0  ? isosorbide mononitrate (IMDUR) 30 MG 24 hr tablet Take 30 mg by  mouth daily.    ? Krill Oil 350 MG CAPS Take 350 mg by mouth daily as needed (Heart).    ? mirtazapine (REMERON) 15 MG tablet Take 15 mg by mouth at bedtime as needed (for panic associated with PTSD).     ? Multiple Vitamin (MULTIVITAMIN WITH MINERALS) TABS tablet Take 1 tablet by mouth daily.    ? nitroGLYCERIN (NITROSTAT) 0.4 MG SL tablet Place 1 tablet (0.4 mg total) under the tongue every 5 (five) minutes as needed for chest pain. 25 tablet 6  ? Tiotropium Bromide Monohydrate 2.5 MCG/ACT AERS Inhale 2 puffs into the lungs at bedtime. Spiriva    ? traMADol (ULTRAM) 50 MG tablet Take 1 tablet (50 mg total) by mouth every 12 (twelve) hours as needed. 14 tablet 0  ? Coenzyme Q10 (COQ10) 200 MG CAPS Take  200 mg by mouth daily. (Patient not taking: Reported on 11/11/2021)    ? guaiFENesin-dextromethorphan (ROBITUSSIN DM) 100-10 MG/5ML syrup Take 5 mLs by mouth every 4 (four) hours as needed for cough. (Patient not taking: Reported on 10/18/2021) 118 mL 0  ? ?No current facility-administered medications on file prior to visit.  ? ? ? ?ROS see history of present illness ? ?Objective ? ?Physical Exam ?Vitals:  ? 11/11/21 1116  ?BP: 116/68  ?Pulse: 83  ?Resp: 14  ?Temp: 97.8 ?F (36.6 ?C)  ?SpO2: 95%  ? ? ?BP Readings from Last 3 Encounters:  ?11/11/21 116/68  ?11/01/21 117/60  ?10/23/21 113/65  ? ?Wt Readings from Last 3 Encounters:  ?11/11/21 213 lb 12.8 oz (97 kg)  ?11/01/21 205 lb 11 oz (93.3 kg)  ?10/18/21 212 lb 11.9 oz (96.5 kg)  ? ? ?Physical Exam ?Constitutional:   ?   General: He is not in acute distress. ?   Appearance: He is not diaphoretic.  ?HENT:  ?   Mouth/Throat:  ?   Mouth: Mucous membranes are moist.  ?   Pharynx: Oropharynx is clear.  ?Neck:  ? ?Cardiovascular:  ?   Rate and Rhythm: Normal rate and regular rhythm.  ?   Heart sounds: Normal heart sounds.  ?Pulmonary:  ?   Effort: Pulmonary effort is normal.  ?   Breath sounds: Normal breath sounds.  ?Skin: ?   General: Skin is warm and dry.  ?Neurological:  ?   Mental Status: He is alert.  ?   Comments: CN 3-12 intact, 4+/5 strength in left bicep, tricep, and grip, otherwise 5/5 strength in right biceps, triceps, grip, bilateral quads, hamstrings, plantar and dorsiflexion, sensation to light touch intact in bilateral UE and LE  ? ? ? ?Assessment/Plan: Please see individual problem list. ? ?Problem List Items Addressed This Visit   ? ? Left-sided weakness  ?  Significantly improved.  Certainly this could have been related to a stroke that was not picked up on MRI or could be related to significant nerve impingement in his cervical spine.  He will continue physical therapy.  He will see the neurosurgeon as planned.  He can continue Tylenol over-the-counter  as needed for pain. ?  ?  ? Neck nodule  ?  Undetermined cause.  There is an area of tenderness in his left neck with a somewhat palpable nodule noted.  Discussed that this could be related to his cancer history or could be related to some other issue.  He is not having any other associated symptoms so it would be odd for this to be related to infection.  Advised that he  should monitor at this time and see his oncology physician as planned.  If it worsens at all before he sees his oncologist he will let me know right away. ? ?  ?  ? ? ?Return in about 6 weeks (around 12/23/2021). ? ?This visit occurred during the SARS-CoV-2 public health emergency.  Safety protocols were in place, including screening questions prior to the visit, additional usage of staff PPE, and extensive cleaning of exam room while observing appropriate contact time as indicated for disinfecting solutions.  ? ?I have spent 35 minutes in the care of this patient regarding History taking, documentation, completion of exam, discussion of plan, review of discharge summary. ? ? ?[name], MD ?[place] ? ?

## 2021-11-14 ENCOUNTER — Encounter: Payer: Self-pay | Admitting: Registered Nurse

## 2021-11-14 ENCOUNTER — Encounter: Payer: Medicare HMO | Attending: Registered Nurse | Admitting: Registered Nurse

## 2021-11-14 VITALS — BP 114/72 | HR 82 | Ht 69.0 in | Wt 216.6 lb

## 2021-11-14 DIAGNOSIS — I639 Cerebral infarction, unspecified: Secondary | ICD-10-CM | POA: Diagnosis present

## 2021-11-14 DIAGNOSIS — I4891 Unspecified atrial fibrillation: Secondary | ICD-10-CM | POA: Diagnosis present

## 2021-11-14 NOTE — Progress Notes (Signed)
Remote pacemaker transmission.   

## 2021-11-14 NOTE — Patient Instructions (Signed)
Call Dr Arnoldo Morale : Neurosurgery  ?[phone] ?[address]  ?Hospital Follow Up  ?

## 2021-11-14 NOTE — Progress Notes (Signed)
? ?Subjective:  ? ? Patient ID: [name], male    DOB: Oct 01, 1945, 76 y.o.   MRN: [id number] ? ?HPI: [name] is a 75 y.o. male who is here for HFU appointment of his Ischemic Stroke and Atrial fibrillation. He presented to [facility] on 10/18/2021 for left sided weakness.  ?Dr, Alfred Levins ?HPI: 10/18/2021 ?HPI ?  ?[name] is a 76 y.o. male with a history of A-fib on Eliquis, CLL, COPD, CAD status post CABG, OSA on CPAP, hypertension, hyperlipidemia, sick sinus syndrome status post pacemaker who presents for evaluation of left-sided weakness.  Patient and wife provide history.  According to them, patient woke up yesterday morning with left-sided weakness and also slurred speech.  The speech has resolved but the weakness has persisted.  He denies any prior history of stroke.  The wife try to bring him to the hospital yesterday but he did not Doyle to come.  Today he was having difficulty walking which prompted his visit his primary care doctor.  PCP recommended patient come to the ED for evaluation.  No recent falls, no headache, no chest pain or shortness of breath.  Patient has had chronic shortness of breath since January when he had COVID but denies any acute changes. ? ?CT Head WO Contrast:  ?IMPRESSION: ?No acute intracranial findings are seen in noncontrast CT brain. ?Atrophy. ?  ?Chronic sinusitis. ?  ?CTA:  ?IMPRESSION: ?1.  No intracranial large vessel occlusion or significant stenosis. ?2. 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 ?Otherwise no hemodynamically significant stenosis in the neck. ?  ?CT Cervical Spine" ?IMPRESSION: ?1. Advanced, generalized cervical spine degeneration with implied ?cord impingement at C3-4 and C6-7. ?2. Multilevel foraminal impingement which is most advanced ?bilaterally at C3-4, bilaterally at C4-5, and on the right at C6-7. ?3. 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 ? ? ?MR Brain:  ?IMPRESSION: ?No acute intracranial pathology.  Unremarkable for age  brain MRI. ?  ?Neurology Consulted.  ? ?Mr. Vizzini was admitted to inpatient rehabilitation on 10/23/2021 and discharged home on 11/01/2021. He states he has pain in his bilateral lower extremities L.R. He rates his pain 9. He also reports he has a good appetite. ? ? ?Pain Inventory ?Average Pain 8 ?Pain Right Now 9 ?My pain is sharp, stabbing, and aching ? ?LOCATION OF PAIN  Thigh, knee, leg ? ?BOWEL ?Number of stools per week: 7 ? ? ?BLADDER ?Normal ? ? ? ?Mobility ?use a cane ?use a walker ?how many minutes can you walk? 5 ?ability to climb steps?  no ?do you drive?  yes ?Do you have any goals in this area?  yes ? ?Function ?not employed: date last employed 2013 ?disabled: date disabled 2017 ?I need assistance with the following:  meal prep and household duties ? ?Neuro/Psych ?trouble walking ?dizziness ? ?Prior Studies ?Any changes since last visit?  no ? ?Physicians involved in your care ?Any changes since last visit?  no ? ? ?Family History  ?Problem Relation Age of Onset  ? Heart disease Mother   ? Heart attack Mother   ? Coronary artery disease Other   ?     family history  ? ?Social History  ? ?Socioeconomic History  ? Marital status: Married  ?  Spouse name: Not on file  ? Number of children: Not on file  ? Years of education: Not on file  ? Highest education level: Not on file  ?Occupational History  ? Occupation: retired  ?  Employer:  [employer]  ?  Comment: Education officer, [employer]  ?Tobacco Use  ? Smoking status: Former  ?  Packs/day: 1.00  ?  Years: 40.00  ?  Pack years: 40.00  ?  Types: Cigarettes  ?  Quit date: 07/21/2006  ?  Years since quitting: 15.3  ? Smokeless tobacco: Never  ?Vaping Use  ? Vaping Use: Former  ?Substance and Sexual Activity  ? Alcohol use: Not Currently  ? Drug use: No  ? Sexual activity: Not Currently  ?Other Topics Concern  ? Not on file  ?Social History Narrative  ? Lives in [place] with wife. Dogs. Goats. Children - 4. Grandchildren - 7.  ?   ? Worked - Surveyor, minerals, part time.  ? Diet - healthy  ? Exercise - very active  ?   ? Service - [employer] - [place]  ? ?Social Determinants of Health  ? ?Financial Resource Strain: Low Risk   ? Difficulty of Paying Living Expenses: Not hard at all  ?Food Insecurity: No Food Insecurity  ? Worried About Charity fundraiser in the Last Year: Never true  ? Ran Out of Food in the Last Year: Never true  ?Transportation Needs: No Transportation Needs  ? Lack of Transportation (Medical): No  ? Lack of Transportation (Non-Medical): No  ?Physical Activity: Sufficiently Active  ? Days of Exercise per Week: 7 days  ? Minutes of Exercise per Session: 30 min  ?Stress: No Stress Concern Present  ? Feeling of Stress : Not at all  ?Social Connections: Unknown  ? Frequency of Communication with Friends and Family: More than three times a week  ? Frequency of Social Gatherings with Friends and Family: Not on file  ? Attends Religious Services: Not on file  ? Active Member of Clubs or Organizations: Not on file  ? Attends Archivist Meetings: Not on file  ? Marital Status: Married  ? ?Past Surgical History:  ?Procedure Laterality Date  ? ABDOMINAL HERNIA REPAIR    ? APPENDECTOMY  06/21/1985  ? CARDIAC CATHETERIZATION  2010; 2011  ? ; Dr Fletcher Anon  ? CORONARY ARTERY BYPASS GRAFT  04/2009  ? "CABG X3"  ? EP IMPLANTABLE DEVICE N/A 03/03/2016  ? Procedure: Pacemaker Implant;  Surgeon: Deboraha Sprang, MD;  Location: [facility];  Service: Cardiovascular;  Laterality: N/A;  ? [place]  ? "shrapnel in my tailbone"  ? INGUINAL HERNIA REPAIR Right   ? INSERT / REPLACE / REMOVE PACEMAKER    ? JOINT REPLACEMENT Right 2018  ? LAPAROSCOPIC CHOLECYSTECTOMY    ? RIGHT/LEFT HEART CATH AND CORONARY/GRAFT ANGIOGRAPHY N/A 02/04/2021  ? Procedure: RIGHT/LEFT HEART CATH AND CORONARY/GRAFT ANGIOGRAPHY;  Surgeon: Nelva Bush, MD;  Location: [facility];  Service: Cardiovascular;  Laterality: N/A;  ? [place]   ? TOTAL HIP ARTHROPLASTY Right 10/22/2016  ? Procedure: TOTAL HIP ARTHROPLASTY;  Surgeon: Dereck Leep, MD;  Location: [facility];  Service: Orthopedics;  Laterality: Right;  ? TOTAL HIP ARTHROPLASTY Left 11/04/2017  ? Procedure: TOTAL HIP ARTHROPLASTY;  Surgeon: Dereck Leep, MD;  Location: [facility];  Service: Orthopedics;  Laterality: Left;  ? ?Past Medical History:  ?Diagnosis Date  ? Anxiety   ? Arthritis   ? Atrial fibrillation ([place])   ? a. Dx 2013, recurred 02/2014, CHA2DS2VASc = 3 -->placed on Eliquis;  b. 02/2014 Echo: EF 50-55%, mid and apical anterior septum and mid and apical inf septum are  abnl, mild to mod Ao sclerosis w/o AS.  ? Cancer associated pain   ? Chicken pox   ? Chronic lymphocytic leukemia ([place])   ? a. Dx 02/2014.  ? CLL (chronic lymphocytic leukemia) ([place])   ? Complication of anesthesia   ? History of  PTSD--do not touch patient when waking up from surgery.  ? COPD (chronic obstructive pulmonary disease) ([place])   ? Coronary artery disease   ? a. 04/2009 CABG x 3 (LIMA->LAD, VG->OM1, VG->PDA);  b. 09/2009 Cath: occluded VG x 2 w/ patent LIMA and L->R collats. EF 55%, mild antlat HK;  c. 10/2011 MV: EF 53%, no isch/infarct-->low risk.  ? Dysrhythmia   ? hx of a-fib  ? GERD (gastroesophageal reflux disease)   ? occasional  ? History of chemotherapy 2015-2016  ? HOH (hard of hearing)   ? Bilateral Hearing Aids  ? Hypertension   ? Myocardial infarction [facility]) 2010  ? OSA on CPAP   ? USE C-PAP  ? Presence of permanent cardiac pacemaker 2017  ? PTSD (post-traumatic stress disorder)   ? PTSD (post-traumatic stress disorder)   ? Pure hypercholesterolemia   ? Rheumatic fever 1959  ? 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 TIA (transient ischemic attack) 11/02/2015  ? ?BP 114/72   Pulse 82   Ht '5\' 9"'$  (1.753 m)   Wt 216 lb 9.6 oz (98.2 kg)   SpO2 94%   BMI 31.99 kg/m?  ? ?Opioid Risk Score:   ?Fall Risk Score:  `1 ? ?Depression screen PHQ 2/9 ? ? ?  11/14/2021  ? 11:39 AM 11/11/2021  ? 11:17  AM 10/18/2021  ? 12:07 PM 08/16/2021  ? 11:11 AM 12/25/2020  ? 11:00 AM 05/14/2020  ? 10:47 AM 01/17/2020  ? 12:05 PM  ?Depression screen PHQ 2/9  ?Decreased Interest 0 0 0 0 0 0 0  ?Down, Depressed, Hopeless 0 0 0

## 2021-11-18 ENCOUNTER — Inpatient Hospital Stay: Payer: Medicare HMO | Attending: Internal Medicine | Admitting: Internal Medicine

## 2021-11-18 ENCOUNTER — Encounter: Payer: Self-pay | Admitting: Internal Medicine

## 2021-11-18 ENCOUNTER — Inpatient Hospital Stay: Payer: Medicare HMO

## 2021-11-18 VITALS — BP 104/64 | HR 82 | Temp 98.7°F | Resp 20 | Wt 215.7 lb

## 2021-11-18 DIAGNOSIS — Z79899 Other long term (current) drug therapy: Secondary | ICD-10-CM | POA: Diagnosis not present

## 2021-11-18 DIAGNOSIS — I4891 Unspecified atrial fibrillation: Secondary | ICD-10-CM | POA: Insufficient documentation

## 2021-11-18 DIAGNOSIS — Z8673 Personal history of transient ischemic attack (TIA), and cerebral infarction without residual deficits: Secondary | ICD-10-CM | POA: Insufficient documentation

## 2021-11-18 DIAGNOSIS — R197 Diarrhea, unspecified: Secondary | ICD-10-CM | POA: Diagnosis not present

## 2021-11-18 DIAGNOSIS — C9110 Chronic lymphocytic leukemia of B-cell type not having achieved remission: Secondary | ICD-10-CM | POA: Insufficient documentation

## 2021-11-18 DIAGNOSIS — M2550 Pain in unspecified joint: Secondary | ICD-10-CM | POA: Diagnosis not present

## 2021-11-18 DIAGNOSIS — Z87891 Personal history of nicotine dependence: Secondary | ICD-10-CM | POA: Insufficient documentation

## 2021-11-18 DIAGNOSIS — D649 Anemia, unspecified: Secondary | ICD-10-CM | POA: Diagnosis not present

## 2021-11-18 LAB — LACTATE DEHYDROGENASE: LDH: 167 U/L (ref 98–192)

## 2021-11-18 LAB — COMPREHENSIVE METABOLIC PANEL
ALT: 30 U/L (ref 0–44)
AST: 28 U/L (ref 15–41)
Albumin: 3.5 g/dL (ref 3.5–5.0)
Alkaline Phosphatase: 110 U/L (ref 38–126)
Anion gap: 5 (ref 5–15)
BUN: 12 mg/dL (ref 8–23)
CO2: 26 mmol/L (ref 22–32)
Calcium: 8.5 mg/dL — ABNORMAL LOW (ref 8.9–10.3)
Chloride: 106 mmol/L (ref 98–111)
Creatinine, Ser: 0.81 mg/dL (ref 0.61–1.24)
GFR, Estimated: >60 mL/min (ref >60)
Glucose, Bld: 112 mg/dL — ABNORMAL HIGH (ref 70–99)
Potassium: 3.7 mmol/L (ref 3.5–5.1)
Sodium: 137 mmol/L (ref 135–145)
Total Bilirubin: 0.6 mg/dL (ref 0.3–1.2)
Total Protein: 6.7 g/dL (ref 6.5–8.1)

## 2021-11-18 LAB — CBC WITH DIFFERENTIAL/PLATELET
Abs Immature Granulocytes: 0.07 10*3/uL (ref 0.00–0.07)
Basophils Absolute: 0.1 10*3/uL (ref 0.0–0.1)
Basophils Relative: 1 %
Eosinophils Absolute: 0.1 10*3/uL (ref 0.0–0.5)
Eosinophils Relative: 1 %
HCT: 35.4 % — ABNORMAL LOW (ref 39.0–52.0)
Hemoglobin: 11.2 g/dL — ABNORMAL LOW (ref 13.0–17.0)
Immature Granulocytes: 1 %
Lymphocytes Relative: 40 %
Lymphs Abs: 4.6 10*3/uL — ABNORMAL HIGH (ref 0.7–4.0)
MCH: 32.3 pg (ref 26.0–34.0)
MCHC: 31.6 g/dL (ref 30.0–36.0)
MCV: 102 fL — ABNORMAL HIGH (ref 80.0–100.0)
Monocytes Absolute: 1 10*3/uL (ref 0.1–1.0)
Monocytes Relative: 9 %
Neutro Abs: 5.6 10*3/uL (ref 1.7–7.7)
Neutrophils Relative %: 48 %
Platelets: 202 10*3/uL (ref 150–400)
RBC: 3.47 MIL/uL — ABNORMAL LOW (ref 4.22–5.81)
RDW: 15.6 % — ABNORMAL HIGH (ref 11.5–15.5)
WBC: 11.4 10*3/uL — ABNORMAL HIGH (ref 4.0–10.5)
nRBC: 0 % (ref 0.0–0.2)

## 2021-11-18 NOTE — Progress Notes (Signed)
[facility] ?OFFICE PROGRESS NOTE ? ?Patient Care Team: ?Leone Haven, MD as PCP - General (Family Medicine) ?[name], MD as PCP - Cardiology (Cardiology) ?[name], MD as Consulting Physician (Cardiology) ?Cammie Sickle, MD as Medical Oncologist (Hematology and Oncology) ? ? Cancer Staging  ?No matching staging information was found for the patient. ? ? ?Oncology History Overview Note  ?# AUG 2015- SLL/CLL [Right Ax Ln Bx] s/p Benda-Rituxan x6 [finished March 2016]; Maintenance Rituxan q 50m [started April 2016; Dr.Pandit];Last Ritux Jan 2017.  MARCH 2017- CT N/C/A/P- NED. STOP Ritux; surveillance  ? ?# AUG 2019- CT/PET- progression/NO transformation; ? ?# NOV 2019- Progression; started Ibrutinib 420 mg/d. STOPPED in end of feb sec to extreme fatigue/joint pains/cramps ? ?#November 01, 2018-start ibrutinib 280 mg a day; December 20, 2018-discontinue ibrutinib secondary multiple side effects.  ? ?#January 03, 2019 start Gazyva + [place] [July]; finished Dec 2020-; started [facility] [employer]cal maintenance [200 mg a day; DI-amiodarone]; SEP 2022- congestive heart failure; s/p cardiac cath-noted to have CAD however no stents placed.  2D echo- EF-55% ? ?#Mid March 2021-venetoclax dose reduced to 100 mg [second diarrhea].  ? ?#? 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  ? ?# s/p PPM [Dr.Klein; Sep 2017]; A.fib [on eliquis]; STOPPED eliuqis Nov 2019- hematuria [Dr.fath] on asprin/amio ? 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 ?SURVIVORSHIP: p ? ?DIAGNOSIS: CLL  ? ?STAGE: IV  ;GOALS: control ? ?CURRENT/MOST RECENT THERAPY : venetoclax  ? ?  ?CLL (chronic lymphocytic leukemia) ([place])  ?01/03/2019 -  Chemotherapy  ? The patient had obinutuzumab (GAZYVA) 100 mg in sodium chloride 0.9 % 100 mL (0.9615 mg/mL) chemo infusion, 100  mg, Intravenous, Once, 6 of 6 cycles ?Administration: 100 mg (01/03/2019), 900 mg (01/04/2019), 1,000 mg (01/10/2019), 1,000 mg (01/31/2019), 1,000 mg (01/17/2019), 1,000 mg (02/28/2019), 1,000 mg (04/07/2019), 1,000 mg (05/05/2019), 1,000 mg (06/02/2019) ? ? for chemotherapy treatment.  ? ?  ? ? ?INTERVAL HISTORY: Accompanied by his wife.   Ambulating with a cane.  ? ?[name] 76 y.o.  male CLL-high risk currently on venetoclax [however on hold for the last 3 months because of multiple hospitalizations] is here for follow-up.  ? ?In the interim patient was evaluated in the hospital for a stroke; discharged to rehab. ? ?Patient states he has a knot on the Left side of his neck.  Denies any sore throat. ? ?Patient continues to have mild left-sided-currently getting physical therapy at home. ? ?Patient continues to have chronic joint pains myalgias not any worse.  No headache.  No weight loss.  No night sweats. ? ?\Review of Systems  ?Constitutional:  Positive for malaise/fatigue. Negative for chills, diaphoresis and fever.  ?HENT:  Negative for nosebleeds and sore throat.   ?Eyes:  Negative for double vision.  ?Respiratory:  Negative for hemoptysis, shortness of breath and wheezing.   ?Cardiovascular:  Negative for chest pain, palpitations, orthopnea and leg swelling.  ?Gastrointestinal:  Positive for diarrhea. Negative for abdominal pain, blood in stool, constipation, heartburn, melena, nausea and vomiting.  ?Genitourinary:  Negative for dysuria, frequency and urgency.  ?Musculoskeletal:  Positive for back pain, joint pain and myalgias.  ?Skin: Negative.  Negative for itching and rash.  ?Neurological:  Negative for dizziness, tingling, focal weakness and headaches.  ?Psychiatric/Behavioral:  Negative for depression. The patient is not nervous/anxious  and does not have insomnia.   ?  ? ?PAST MEDICAL HISTORY :  ?Past Medical History:  ?Diagnosis Date  ? Anxiety   ? Arthritis   ? Atrial fibrillation ([place])   ? a. Dx  2013, recurred 02/2014, CHA2DS2VASc = 3 -->placed on Eliquis;  b. 02/2014 Echo: EF 50-55%, mid and apical anterior septum and mid and apical inf septum are abnl, mild to mod Ao sclerosis w/o AS.  ? Cancer associated pain   ? Chicken pox   ? Chronic lymphocytic leukemia ([place])   ? a. Dx 02/2014.  ? CLL (chronic lymphocytic leukemia) ([place])   ? Complication of anesthesia   ? History of  PTSD--do not touch patient when waking up from surgery.  ? COPD (chronic obstructive pulmonary disease) ([place])   ? Coronary artery disease   ? a. 04/2009 CABG x 3 (LIMA->LAD, VG->OM1, VG->PDA);  b. 09/2009 Cath: occluded VG x 2 w/ patent LIMA and L->R collats. EF 55%, mild antlat HK;  c. 10/2011 MV: EF 53%, no isch/infarct-->low risk.  ? Dysrhythmia   ? hx of a-fib  ? GERD (gastroesophageal reflux disease)   ? occasional  ? History of chemotherapy 2015-2016  ? HOH (hard of hearing)   ? Bilateral Hearing Aids  ? Hypertension   ? Myocardial infarction [facility]) 2010  ? OSA on CPAP   ? USE C-PAP  ? Presence of permanent cardiac pacemaker 2017  ? PTSD (post-traumatic stress disorder)   ? PTSD (post-traumatic stress disorder)   ? Pure hypercholesterolemia   ? Rheumatic fever 1959  ? 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 TIA (transient ischemic attack) 11/02/2015  ? ? ?PAST SURGICAL HISTORY :   ?Past Surgical History:  ?Procedure Laterality Date  ? ABDOMINAL HERNIA REPAIR    ? APPENDECTOMY  06/21/1985  ? CARDIAC CATHETERIZATION  2010; 2011  ? ; Dr Fletcher Anon  ? CORONARY ARTERY BYPASS GRAFT  04/2009  ? "CABG X3"  ? EP IMPLANTABLE DEVICE N/A 03/03/2016  ? Procedure: Pacemaker Implant;  Surgeon: Deboraha Sprang, MD;  Location: [place];  Service: Cardiovascular;  Laterality: N/A;  ? [place]  ? "shrapnel in my tailbone"  ? INGUINAL HERNIA REPAIR Right   ? INSERT / REPLACE / REMOVE PACEMAKER    ? JOINT REPLACEMENT Right 2018  ? LAPAROSCOPIC CHOLECYSTECTOMY    ? RIGHT/LEFT HEART CATH AND CORONARY/GRAFT ANGIOGRAPHY N/A  02/04/2021  ? Procedure: RIGHT/LEFT HEART CATH AND CORONARY/GRAFT ANGIOGRAPHY;  Surgeon: Nelva Bush, MD;  Location: [facility];  Service: Cardiovascular;  Laterality: N/A;  ? [place]  ? TOTAL HIP ARTHROPLASTY Right 10/22/2016  ? Procedure: TOTAL HIP ARTHROPLASTY;  Surgeon: Dereck Leep, MD;  Location: [facility];  Service: Orthopedics;  Laterality: Right;  ? TOTAL HIP ARTHROPLASTY Left 11/04/2017  ? Procedure: TOTAL HIP ARTHROPLASTY;  Surgeon: Dereck Leep, MD;  Location: [facility];  Service: Orthopedics;  Laterality: Left;  ? ? ?FAMILY HISTORY :   ?Family History  ?Problem Relation Age of Onset  ? Heart disease Mother   ? Heart attack Mother   ? Coronary artery disease Other   ?     family history  ? ? ?SOCIAL HISTORY:   ?Social History  ? ?Tobacco Use  ? Smoking status: Former  ?  Packs/day: 1.00  ?  Years: 40.00  ?  Pack years: 40.00  ?  Types: Cigarettes  ?  Quit date: 07/21/2006  ?  Years since quitting: 15.3  ? Smokeless tobacco: Never  ?Vaping Use  ? Vaping Use: Former  ?Substance Use Topics  ? Alcohol use: Not Currently  ? Drug use: No  ? ? ?ALLERGIES:  has No Known Allergies. ? ?MEDICATIONS:  ?Current Outpatient Medications  ?Medication Sig Dispense Refill  ? acetaminophen (TYLENOL) 325 MG tablet Take 1-2 tablets (325-650 mg total) by mouth every 4 (four) hours as needed for mild pain.    ? acyclovir (ZOVIRAX) 400 MG tablet TAKE 1 TABLET(400 MG) BY MOUTH TWICE DAILY 60 tablet 6  ? albuterol (VENTOLIN HFA) 108 (90 Base) MCG/ACT inhaler Inhale 2 puffs into the lungs every 6 (six) hours as needed for wheezing or shortness of breath. 1 each 0  ? Alirocumab (PRALUENT) 150 MG/ML SOAJ Inject 150 mg into the skin every 14 (fourteen) days. 2 mL 12  ? amiodarone (PACERONE) 200 MG tablet Take 1/2 tablet (100 mg) by mouth twice daily 5 days a week 60 tablet 6  ? apixaban (ELIQUIS) 5 MG TABS tablet Take 5 mg by mouth 2 (two) times daily.    ? azelastine (ASTELIN) 0.1 % nasal spray  Place 2 sprays into both nostrils 2 (two) times daily. Use in each nostril as directed 30 mL 1  ? budesonide-formoterol (SYMBICORT) 160-4.5 MCG/ACT inhaler Inhale 2 puffs into the lungs 2 (two) times daily.

## 2021-11-18 NOTE — Progress Notes (Signed)
Patient states he has a knot on the Left side of his neck.  ?

## 2021-11-18 NOTE — Assessment & Plan Note (Addendum)
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 NOV 7th, 2022-  Bilateral axial lymph nodes at the upper limits normal increased in size from comparison CT; Slight increased in pelvic LN. Monitor for now.  ? ?# Currently off  Venoclax x 3 months [100 mg sec to diarrhea; DI with Amio]-multiple hospitalizations etc.  Recommend restarting venetoclax.  Will order imaging- Neck/CAP in 1 month-especially given left neck lymphadenopathy. ? ?# Left sided CVA- [s/p rehab; April 2023]-stable/improving.  Unrelated to CLL or venetoclax. ? ?#Diarrhea-G-1 ? venatolclax- monitor for now.STABLE.  ? ?# Low immunoglobulins IgG 330/recent severe respiratory infection/CLL-s/pI IVIG #4/4 [last April 2022].NOV 2022- IgG > 500.  Monitor for now. ? ?# A.fib-on amiodarone [drug interaction-amiodarone; Dr.Gollan] sinus rhythm-on Eliquis/CHF- lasix 40/d- s/p Cath [AUG, 2022- Echo-55%] -STABLE. ? ?# MIld Anemia Hb ~11-12; June 2022-ferritin 17 iron saturation 33% --STABLE. ? ?# Joint pains/ Muscle pain [improved after stopping statin; shot-ok]-sec to Ventoclax-G-1-2;? R continue tramadol/CBD prn; refilled- -STABLE ? ?DISPOSITION: ?# follow up in 1 months  MD; labs- cbc/cmp/ldh; CT CAP prior-- - Dr.B ? ? ?

## 2021-11-19 ENCOUNTER — Encounter: Payer: Self-pay | Admitting: Internal Medicine

## 2021-11-29 ENCOUNTER — Ambulatory Visit: Payer: Medicare HMO | Admitting: Adult Health

## 2021-11-29 ENCOUNTER — Telehealth: Payer: Self-pay | Admitting: Adult Health

## 2021-11-29 ENCOUNTER — Encounter: Payer: Self-pay | Admitting: Adult Health

## 2021-11-29 ENCOUNTER — Telehealth: Payer: Self-pay | Admitting: Pulmonary Disease

## 2021-11-29 VITALS — BP 120/60 | HR 94 | Temp 97.8°F | Ht 69.0 in | Wt 206.8 lb

## 2021-11-29 DIAGNOSIS — J432 Centrilobular emphysema: Secondary | ICD-10-CM | POA: Diagnosis not present

## 2021-11-29 DIAGNOSIS — C9110 Chronic lymphocytic leukemia of B-cell type not having achieved remission: Secondary | ICD-10-CM

## 2021-11-29 DIAGNOSIS — G4733 Obstructive sleep apnea (adult) (pediatric): Secondary | ICD-10-CM | POA: Diagnosis not present

## 2021-11-29 DIAGNOSIS — R531 Weakness: Secondary | ICD-10-CM | POA: Diagnosis not present

## 2021-11-29 DIAGNOSIS — Z9989 Dependence on other enabling machines and devices: Secondary | ICD-10-CM

## 2021-11-29 DIAGNOSIS — I5032 Chronic diastolic (congestive) heart failure: Secondary | ICD-10-CM

## 2021-11-29 NOTE — Assessment & Plan Note (Signed)
Excellent control and compliance on CPAP. ?Patient's machine is greater than 77 years old.  Needs a new CPAP machine. ? ?Plan  ?Patient Instructions  ?Continue on Symbicort 2 puffs twice daily, rinse after use ?Continue on Spiriva daily ?Activity as tolerated ?Albuterol inhaler as needed ?Saline Nasal spray Twice daily   ?Saline nasal gel At bedtime   ? ?Continue on CPAP at bedtime ?Goal is to wear your CPAP every single night ?Work on healthy weight loss ?Do not drive if sleepy  ?Order for new CPAP machine .  ? ?Follow up with Dr. Mortimer Fries in 4 months and As needed   ? ?  ? ?

## 2021-11-29 NOTE — Telephone Encounter (Signed)
Venetoclax added to medication list.  ?Patient is aware and voiced his understanding.  ?Nothing further needed.  ? ?

## 2021-11-29 NOTE — Progress Notes (Signed)
? ?'@Patient'$  ID: [name], male    DOB: 15-Jan-1946, 76 y.o.   MRN: [id number] ? ?Chief Complaint  ?Patient presents with  ? Follow-up  ? ? ?Referring provider: ?Leone Haven, MD ? ?HPI: ?76 year old male former smoker followed for COPD and obstructive sleep apnea ?Medical history significant for A-fib on amiodarone and Eliquis, diastolic heart failure, coronary artery disease, CVA, status post PPM, CLL followed by Oncology  ?Patient is a veteran, [place] war gets meds through the [place] system ? ?TEST/EVENTS :  ?CT chest September 13, 2021 bilateral atelectasis otherwise with no acute findings, negative PE ? ?2D echo August 2022 EF 55 to 24%, grade 1 diastolic dysfunction, right ventricular systolic function mildly reduced, RV size normal ? ?PFTs October 03, 2020 showed moderate airflow obstruction with reversibility, FEV1 64%, ratio 66, FVC 70%, positive bronchodilator response, severe airflow obstruction with significant reversibility, DLCO 48%. ? ? ?11/29/2021 Follow up : OSA , COPD  ?Patient returns for a follow-up visit.  Last seen in the office February 2022.  Patient has underlying COPD patient says overall breathing is doing the same. Sedentary lifestyle. Gets winded with walking. No dyspnea at rest. Has minimal cough . Has some post nasal drip . He remains on Symbicort and Spiriva. Uses albuterol 1-2 times a day.  ? ?Patient has been in the hospital twice since the beginning of the year.  He was admitted in February with COVID-19 infection And COPD exacerbation.  Patient was treated with remdesivir.  Required supplemental oxygen.  He also was treated with steroids.  Prior to discharge did not require oxygen. Says feels breathing is getting better. ? ?Readmitted in April with a acute stroke.  He received inpatient services with PT and OT.  Went to rehab x 2 due to weakness.   MRI neck showed multilevel spondylosis and multilevel spinal and foraminal stenosis throughout the cervical spine.  Since discharge  patient is feeling some better. Some weakness in left leg. Home PT weekly . Trying to do home exercises. Still can not drive.  ? ?Patient has underlying sleep apnea.  He is on CPAP at bedtime.  Patient says he wears his CPAP every single night.  Cannot sleep without it.  Typically gets in about 7 hours of sleep.  CPAP download was requested.   Needs a new machine. Machine is >78 years old.  ?Patient says he feels rested on his CPAP and feels that he benefits from CPAP. ?Patient's previous CPAP pressure was CPAP 8 cm H2O. ? ? ?No Known Allergies ? ?Immunization History  ?Administered Date(s) Administered  ? Fluad Quad(high Dose 65+) 04/04/2020, 05/02/2021  ? Influenza Split 05/21/2012  ? Influenza, High Dose Seasonal PF 04/21/2018  ? Influenza, Seasonal, Injecte, Preservative Fre 03/31/2012, 04/06/2013  ? Influenza,inj,Quad PF,6+ Mos 03/16/2014, 06/20/2015, 04/27/2016, 04/17/2017  ? Influenza-Unspecified 08/06/2015, 05/12/2016, 04/15/2018  ? PFIZER(Purple Top)SARS-COV-2 Vaccination 08/22/2019, 09/15/2019, 10/11/2019, 04/17/2020  ? Pension scheme manager 69yr & up 05/17/2021  ? Pneumococcal Conjugate-13 04/07/2014, 07/05/2015  ? Pneumococcal Polysaccharide-23 07/21/2008, 01/07/2011, 04/21/2015, 08/06/2015  ? Tdap 03/28/2013, 01/14/2018, 05/07/2021  ? ? ?Past Medical History:  ?Diagnosis Date  ? Anxiety   ? Arthritis   ? Atrial fibrillation ([place]   ? a. Dx 2013, recurred 02/2014, CHA2DS2VASc = 3 -->placed on Eliquis;  b. 02/2014 Echo: EF 50-55%, mid and apical anterior septum and mid and apical inf septum are abnl, mild to mod Ao sclerosis w/o AS.  ? Cancer associated pain   ? Chicken pox   ?  Chronic lymphocytic leukemia ([place])   ? a. Dx 02/2014.  ? CLL (chronic lymphocytic leukemia) ([place])   ? Complication of anesthesia   ? History of  PTSD--do not touch patient when waking up from surgery.  ? COPD (chronic obstructive pulmonary disease) ([place])   ? Coronary artery disease   ? a. 04/2009 CABG x 3  (LIMA->LAD, VG->OM1, VG->PDA);  b. 09/2009 Cath: occluded VG x 2 w/ patent LIMA and L->R collats. EF 55%, mild antlat HK;  c. 10/2011 MV: EF 53%, no isch/infarct-->low risk.  ? Dysrhythmia   ? hx of a-fib  ? GERD (gastroesophageal reflux disease)   ? occasional  ? History of chemotherapy 2015-2016  ? HOH (hard of hearing)   ? Bilateral Hearing Aids  ? Hypertension   ? Myocardial infarction [facility]) 2010  ? OSA on CPAP   ? USE C-PAP  ? Presence of permanent cardiac pacemaker 2017  ? PTSD (post-traumatic stress disorder)   ? PTSD (post-traumatic stress disorder)   ? Pure hypercholesterolemia   ? Rheumatic fever 1959  ? 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 TIA (transient ischemic attack) 11/02/2015  ? ? ?Tobacco History: ?Social History  ? ?Tobacco Use  ?Smoking Status Former  ? Packs/day: 1.00  ? Years: 40.00  ? Pack years: 40.00  ? Types: Cigarettes  ? Quit date: 07/21/2006  ? Years since quitting: 15.3  ?Smokeless Tobacco Never  ? ?Counseling given: Not Answered ? ? ?Outpatient Medications Prior to Visit  ?Medication Sig Dispense Refill  ? acetaminophen (TYLENOL) 325 MG tablet Take 1-2 tablets (325-650 mg total) by mouth every 4 (four) hours as needed for mild pain.    ? acyclovir (ZOVIRAX) 400 MG tablet TAKE 1 TABLET(400 MG) BY MOUTH TWICE DAILY 60 tablet 6  ? albuterol (VENTOLIN HFA) 108 (90 Base) MCG/ACT inhaler Inhale 2 puffs into the lungs every 6 (six) hours as needed for wheezing or shortness of breath. 1 each 0  ? Alirocumab (PRALUENT) 150 MG/ML SOAJ Inject 150 mg into the skin every 14 (fourteen) days. 2 mL 12  ? amiodarone (PACERONE) 200 MG tablet Take 1/2 tablet (100 mg) by mouth twice daily 5 days a week 60 tablet 6  ? apixaban (ELIQUIS) 5 MG TABS tablet Take 5 mg by mouth 2 (two) times daily.    ? azelastine (ASTELIN) 0.1 % nasal spray Place 2 sprays into both nostrils 2 (two) times daily. Use in each nostril as directed 30 mL 1  ? budesonide-formoterol (SYMBICORT) 160-4.5 MCG/ACT inhaler  Inhale 2 puffs into the lungs 2 (two) times daily. 1 Inhaler 11  ? cetirizine (ZYRTEC) 10 MG tablet Take 10 mg by mouth daily as needed for allergies.     ? Coenzyme Q10 (COQ10) 200 MG CAPS Take 200 mg by mouth daily.    ? ezetimibe (ZETIA) 10 MG tablet Take 1 tablet (10 mg total) by mouth daily. 90 tablet 3  ? furosemide (LASIX) 40 MG tablet Take 1 tablet (40 mg total) by mouth daily. 30 tablet 0  ? guaiFENesin-dextromethorphan (ROBITUSSIN DM) 100-10 MG/5ML syrup Take 5 mLs by mouth every 4 (four) hours as needed for cough. 118 mL 0  ? isosorbide mononitrate (IMDUR) 30 MG 24 hr tablet Take 30 mg by mouth daily.    ? Krill Oil 350 MG CAPS Take 350 mg by mouth daily as needed (Heart).    ? mirtazapine (REMERON) 15 MG tablet Take 15 mg by mouth at bedtime as needed (for panic  associated with PTSD).     ? Multiple Vitamin (MULTIVITAMIN WITH MINERALS) TABS tablet Take 1 tablet by mouth daily.    ? nitroGLYCERIN (NITROSTAT) 0.4 MG SL tablet Place 1 tablet (0.4 mg total) under the tongue every 5 (five) minutes as needed for chest pain. 25 tablet 6  ? Tiotropium Bromide Monohydrate 2.5 MCG/ACT AERS Inhale 2 puffs into the lungs at bedtime. Spiriva    ? traMADol (ULTRAM) 50 MG tablet Take 1 tablet (50 mg total) by mouth every 12 (twelve) hours as needed. 14 tablet 0  ? ?No facility-administered medications prior to visit.  ? ? ? ?Review of Systems:  ? ?Constitutional:   No  weight loss, night sweats,  Fevers, chills, ?+ fatigue, or  lassitude. ? ?HEENT:   No headaches,  Difficulty swallowing,  Tooth/dental problems, or  Sore throat,  ?              No sneezing, itching, ear ache, nasal congestion, post nasal drip,  ? ?CV:  No chest pain,  Orthopnea, PND, swelling in lower extremities, anasarca, dizziness, palpitations, syncope.  ? ?GI  No heartburn, indigestion, abdominal pain, nausea, vomiting, diarrhea, change in bowel habits, loss of appetite, bloody stools.  ? ?Resp:   No chest wall deformity ? ?Skin: no rash or  lesions. ? ?GU: no dysuria, change in color of urine, no urgency or frequency.  No flank pain, no hematuria  ? ?MS:  No joint pain or swelling.  No decreased range of motion.  No back pain. ? ? ? ?Physical Exam ? ?BP 120

## 2021-11-29 NOTE — Telephone Encounter (Signed)
error 

## 2021-11-29 NOTE — Assessment & Plan Note (Signed)
Physical deconditioning with left-sided weakness.  Patient is continue with home PT.  Activity as tolerated. ?

## 2021-11-29 NOTE — Assessment & Plan Note (Signed)
Continue follow-up with oncology 

## 2021-11-29 NOTE — Assessment & Plan Note (Signed)
Moderate COPD-recent exacerbation with COVID-19 infection in February.  Seems to be back to baseline.  Patient is continue on his triple therapy maintenance inhalers.  ?Activity as tolerated. ?COVID-vaccine and influenza vaccines are up-to-date ?Pneumonia vaccine is up-to-date ? ?Plan  ?Patient Instructions  ?Continue on Symbicort 2 puffs twice daily, rinse after use ?Continue on Spiriva daily ?Activity as tolerated ?Albuterol inhaler as needed ?Saline Nasal spray Twice daily   ?Saline nasal gel At bedtime   ? ?Continue on CPAP at bedtime ?Goal is to wear your CPAP every single night ?Work on healthy weight loss ?Do not drive if sleepy  ?Order for new CPAP machine .  ? ?Follow up with Dr. Mortimer Fries in 4 months and As needed   ? ?  ? ?

## 2021-11-29 NOTE — Assessment & Plan Note (Signed)
Appears compensated on present regimen.  Euvolemic on exam.  Continue follow-up with cardiology ?

## 2021-11-29 NOTE — Patient Instructions (Addendum)
Continue on Symbicort 2 puffs twice daily, rinse after use ?Continue on Spiriva daily ?Activity as tolerated ?Albuterol inhaler as needed ?Saline Nasal spray Twice daily   ?Saline nasal gel At bedtime   ? ?Continue on CPAP at bedtime ?Goal is to wear your CPAP every single night ?Work on healthy weight loss ?Do not drive if sleepy  ?Order for new CPAP machine .  ? ?Follow up with Dr. Mortimer Fries in 4 months and As needed   ? ?

## 2021-12-06 ENCOUNTER — Other Ambulatory Visit: Payer: Self-pay | Admitting: Internal Medicine

## 2021-12-06 ENCOUNTER — Other Ambulatory Visit (HOSPITAL_COMMUNITY): Payer: Self-pay

## 2021-12-06 MED ORDER — VENETOCLAX 100 MG PO TABS
ORAL_TABLET | Freq: Every day | ORAL | 0 refills | Status: DC
  Filled 2021-12-06: qty 30, 30d supply, fill #0

## 2021-12-09 NOTE — Progress Notes (Unsigned)
. Cardiology Office Note  Date:  12/10/2021   ID:  [name], DOB May 29, 1946, MRN [id number]  PCP:  Leone Haven, MD   Chief Complaint  Patient presents with   Follow-up    6 month follow up. Shortness of Breath. Medications verbally reviewed with patient.    HPI:  [name] is a 76 year old gentleman with a history of  smoking, coronary artery disease,  cabg,  catheterization March 2011 with occluded vein graft 2, patent LIMA, collaterals from left to right,  atrial fibrillation March 2013, 10/11/2014. Converted to normal with metoprolol,  Possible conversion pause. 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  Last seen in clinic by myself November 2022 Recovered from covid early 2023, hospitalized  Presenting to the hospital 10/18/2021 for left-sided weakness.   outside the window for thrombolytics.  EKG was unremarkable.   CTA negative for stroke.  Unable to perform MRI due to patient's pacemaker.  CTA of head and neck performed without evidence of LVO.  Neurology consulted and patient likely had suffered ischemic stroke.  Eliquis held and aspirin continued.  Eliquis was started 5 days after initial presentation. Spent time in rehab after admission  C-spine MRI shows multilevel spinal stenosis. Had follow-up by Dr. Arnoldo Morale on 4/13.    On last clinic visit positive orthostatics, was having diarrhea spells which he attributed to his cancer medication Isosorbide was held at that time   on venetoclax [pre Dr. B: on hold for the last 3 months because of multiple hospitalizations Restarted mediaction recently  Recent pacer downloads reviewed, no significant arrhythmia  Taking amiodarone to 5 days a week i.e. 1000 mg a week He is unclear if he is taking isosorbide on today's visit, reports  metoprolol was held  EKG personally reviewed by myself on todays visit Shows normal sinus rhythm rate 89 bpm nonspecific ST abnormality   Other past medical history reviewed Prior history of falls On a prior office visit reported having a mechanical fall at home in the barn landing on cement Suffered ear trauma on the right, almost tore his ear off, "barely hanging on".   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  Ectatic abdominal aorta at risk for aneurysm development.    Echocardiogram 02/04/20 EF 55 to 60%, no RWMA, mild LVH, grade 1 diastolic dysfunction, RV normal size and function, mildly elevated PASP with RV systolic pressure 65.6 mmHg, mild MR, mild to moderate aortic valve sclerosis without stenosis.    Chronic leg pain, now on tramadol BID "from cancer medication"  Reports long history of bad muscle pain in thighs, No better with dexamethasone constant day and night,   fell off a ladder beginning of October 2017 CT at [facility]: 04/25/2016 showing large hematoma Right gluteal intramuscular hematoma  measuring 9.6 x 6.8 x 4.9 cm [facility] stopped eliquis, statin (unclear why they stopped his statin)  Working with the [facility] for PTSD, reports feeling well, stable Reports that he is been compliant with his Lasix, taking 80 mg daily which is higher dose than before for the past 5 days, no improvement in  his leg swelling   atrial fibrillation/flutter starting September 12 2015, then persistent flutter In the clinic his heart rate was 126 bpm Amiodarone dosing was increased, amlodipine was changed to diltiazem 120 mg daily, metoprolol increased up to 25 mill grams twice a day Converted to normal sinus rhythm at home    tachycardia concerning  for atrial fibrillation 05/10/2015. Lasted only several minutes Rate possibly up to 140 bpm.   Previously has been very active, building a barn, tool shed, taking care of animals  some benefits from the [place] for agent orange exposure and his cancer   Previous catheterization showing occluded LAD in the mid vessel, 60% left main, 99% distal RCA who was sent to [facility] October 2010 for bypass surgery. He received 3 vessel bypass by Dr.Gerhardt with a LIMA to the LAD, vein graft to the OM1 and vein graft to the PDA, with subsequent cardiac catheter March 2011 for chest discomfort showing occluded vein grafts x2 with patent LIMA. He has collateral vessels from left to right. Ejection fraction 55% mild anterolateral hypokinesis.  PMH:   has a past medical history of Anxiety, Arthritis, Atrial fibrillation ([place]), Cancer associated pain, Chicken pox, Chronic lymphocytic leukemia ([place]), CLL (chronic lymphocytic leukemia) ([place]), Complication of anesthesia, COPD (chronic obstructive pulmonary disease) ([place]), Coronary artery disease, Dysrhythmia, GERD (gastroesophageal reflux disease), History of chemotherapy (2015-2016), HOH (hard of hearing), Hypertension, Myocardial infarction ([place]) (2010), OSA on CPAP, Presence of permanent cardiac pacemaker (2017), PTSD (post-traumatic stress disorder), PTSD (post-traumatic stress disorder), Pure hypercholesterolemia, Rheumatic fever (1959), 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  PSH:    Past Surgical History:  Procedure Laterality Date   ABDOMINAL HERNIA REPAIR     APPENDECTOMY  06/21/1985   CARDIAC CATHETERIZATION  2010; 2011   ; Dr Fletcher Anon   CORONARY ARTERY BYPASS GRAFT  04/2009   "CABG X3"   EP IMPLANTABLE DEVICE N/A 03/03/2016   Procedure: Pacemaker Implant;  Surgeon: Deboraha Sprang, MD;  Location: [place] [facility];  Service: Cardiovascular;  Laterality: N/A;   FOREIGN BODY REMOVAL  1968    "shrapnel in my tailbone"   INGUINAL HERNIA REPAIR Right    INSERT / REPLACE / [place] Right 2018   LAPAROSCOPIC CHOLECYSTECTOMY     RIGHT/LEFT HEART CATH AND CORONARY/GRAFT ANGIOGRAPHY N/A 02/04/2021   Procedure: RIGHT/LEFT HEART CATH AND CORONARY/GRAFT ANGIOGRAPHY;  Surgeon: Nelva Bush, MD;  Location: [place] [facility];  Service: Cardiovascular;  Laterality: N/A;   TONSILLECTOMY AND ADENOIDECTOMY  1956   TOTAL HIP ARTHROPLASTY Right 10/22/2016   Procedure: TOTAL HIP ARTHROPLASTY;  Surgeon: Dereck Leep, MD;  Location: [facility];  Service: Orthopedics;  Laterality: Right;   TOTAL HIP ARTHROPLASTY Left 11/04/2017   Procedure: TOTAL HIP ARTHROPLASTY;  Surgeon: Dereck Leep, MD;  Location: [facility];  Service: Orthopedics;  Laterality: Left;    Current Outpatient Medications  Medication Sig Dispense Refill   acetaminophen (TYLENOL) 325 MG tablet Take 1-2 tablets (325-650 mg total) by mouth every 4 (four) hours as needed for mild pain.     acyclovir (ZOVIRAX) 400 MG tablet TAKE 1 TABLET(400 MG) BY MOUTH TWICE DAILY 60 tablet 6   albuterol (VENTOLIN HFA) 108 (90 Base) MCG/ACT inhaler Inhale 2 puffs into the lungs every 6 (six) hours as needed for wheezing or shortness of breath. 1 each 0   Alirocumab (PRALUENT) 150 MG/ML SOAJ Inject 150 mg into the  skin every 14 (fourteen) days. 2 mL 12   amiodarone (PACERONE) 200 MG tablet Take 1/2 tablet (100 mg) by mouth twice daily 5 days a week 60 tablet 6   apixaban (ELIQUIS) 5 MG TABS tablet Take 5 mg by mouth 2 (two) times daily.     azelastine (ASTELIN) 0.1 % nasal spray Place 2 sprays into both nostrils 2 (two) times daily. Use in each nostril as directed 30 mL 1   budesonide-formoterol (SYMBICORT) 160-4.5 MCG/ACT inhaler Inhale 2 puffs into the lungs 2 (two) times daily. 1 Inhaler 11   cetirizine (ZYRTEC) 10 MG tablet Take 10 mg by mouth daily as needed for allergies.      Coenzyme Q10 (COQ10) 200 MG CAPS Take 200  mg by mouth daily.     ezetimibe (ZETIA) 10 MG tablet Take 1 tablet (10 mg total) by mouth daily. 90 tablet 3   furosemide (LASIX) 40 MG tablet Take 1 tablet (40 mg total) by mouth daily. 30 tablet 0   guaiFENesin-dextromethorphan (ROBITUSSIN DM) 100-10 MG/5ML syrup Take 5 mLs by mouth every 4 (four) hours as needed for cough. 118 mL 0   isosorbide mononitrate (IMDUR) 30 MG 24 hr tablet Take 30 mg by mouth daily.     Krill Oil 350 MG CAPS Take 350 mg by mouth daily as needed (Heart).     mirtazapine (REMERON) 15 MG tablet Take 15 mg by mouth at bedtime as needed (for panic associated with PTSD).      Multiple Vitamin (MULTIVITAMIN WITH MINERALS) TABS tablet Take 1 tablet by mouth daily.     nitroGLYCERIN (NITROSTAT) 0.4 MG SL tablet Place 1 tablet (0.4 mg total) under the tongue every 5 (five) minutes as needed for chest pain. 25 tablet 6   Tiotropium Bromide Monohydrate 2.5 MCG/ACT AERS Inhale 2 puffs into the lungs at bedtime. Spiriva     traMADol (ULTRAM) 50 MG tablet Take 1 tablet (50 mg total) by mouth every 12 (twelve) hours as needed. 14 tablet 0   venetoclax (VENCLEXTA) 100 MG tablet Take 100 mg by mouth daily. Tablets should be swallowed whole with a meal and a full glass of water.     venetoclax (VENCLEXTA) 100 MG tablet TAKE 1 TABLET BY MOUTH ONCE DAILY (Patient not taking: Reported on 12/10/2021) 30 tablet 0   No current facility-administered medications for this visit.     Allergies:   Patient has no known allergies.   Social History:  The patient  reports that he quit smoking about 15 years ago. His smoking use included cigarettes. He has a 40.00 pack-year smoking history. He has never used smokeless tobacco. He reports that he does not currently use alcohol. He reports that he does not use drugs.   Family History:   family history includes Coronary artery disease in an other family member; Heart attack in his mother; Heart disease in his mother.    Review of Systems: Review of  Systems  Constitutional: Negative.   HENT: Negative.    Respiratory: Negative.    Cardiovascular: Negative.   Gastrointestinal: Negative.   Musculoskeletal: Negative.   Neurological: Negative.   Psychiatric/Behavioral: Negative.    All other systems reviewed and are negative.  PHYSICAL EXAM: VS:  BP 110/60 (BP Location: Left Arm, Patient Position: Sitting, Cuff Size: Normal)   Pulse 89   Ht '5\' 9"'$  (1.753 m)   Wt 201 lb 3.2 oz (91.3 kg)   SpO2 94%   BMI 29.71 kg/m  , BMI Body  mass index is 29.71 kg/m. Constitutional:  oriented to person, place, and time. No distress.  HENT:  Head: Grossly normal Eyes:  no discharge. No scleral icterus.  Neck: No JVD, no carotid bruits  Cardiovascular: Regular rate and rhythm, no murmurs appreciated Pulmonary/Chest: Clear to auscultation bilaterally, no wheezes or rails Abdominal: Soft.  no distension.  no tenderness.  Musculoskeletal: Normal range of motion Neurological:  normal muscle tone. Coordination normal. No atrophy Skin: Skin warm and dry Psychiatric: normal affect, pleasant  Recent Labs: 07/02/2021: TSH 1.900 09/06/2021: Magnesium 2.3 09/13/2021: B Natriuretic Peptide 47.0 11/18/2021: ALT 30; BUN 12; Creatinine, Ser 0.81; Hemoglobin 11.2; Platelets 202; Potassium 3.7; Sodium 137    Lipid Panel Lab Results  Component Value Date   CHOL 79 10/19/2021   HDL 37 (L) 10/19/2021   LDLCALC 27 10/19/2021   TRIG 77 10/19/2021      Wt Readings from Last 3 Encounters:  12/10/21 201 lb 3.2 oz (91.3 kg)  11/29/21 206 lb 12.8 oz (93.8 kg)  11/18/21 215 lb 11.2 oz (97.8 kg)     ASSESSMENT AND PLAN: Atrial fibrillation, unspecified type (HCC)  Maintaining normal sinus rhythm, continue amiodarone,  Appears he is no longer on metoprolol, we have reviewed many of his recent hospitalizations over the past 3 months.  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 No further workup at this time. Continue current medication regimen.  Chronic diastolic CHF (congestive heart failure) (HCC) On lasix 40 daily, appears euvolemic  Centrilobular emphysema (HCC) Stable, SOB  Sinus pause  pacemaker placement, reports having occasional dropout or palpitation Pacer downloads reviewed, no significant arrhythmia  Leg pain No extremity Doppler fine, ABIs normal Reports leg weakness, working with PT   Total encounter time more than 40 minutes  Greater than 50% was spent in counseling and coordination of care with the patient   Orders Placed This Encounter  Procedures   EKG 12-Lead     Signed, Esmond Plants, M.D., Ph.D. 12/10/2021  [place], [place]

## 2021-12-10 ENCOUNTER — Encounter: Payer: Self-pay | Admitting: Cardiovascular Disease

## 2021-12-10 ENCOUNTER — Ambulatory Visit (INDEPENDENT_AMBULATORY_CARE_PROVIDER_SITE_OTHER): Payer: Medicare HMO | Admitting: Cardiovascular Disease

## 2021-12-10 VITALS — BP 110/60 | HR 89 | Ht 69.0 in | Wt 201.2 lb

## 2021-12-10 DIAGNOSIS — I25118 Atherosclerotic heart disease of native coronary artery with other forms of angina pectoris: Secondary | ICD-10-CM

## 2021-12-10 DIAGNOSIS — I10 Essential (primary) hypertension: Secondary | ICD-10-CM | POA: Diagnosis not present

## 2021-12-10 DIAGNOSIS — Z950 Presence of cardiac pacemaker: Secondary | ICD-10-CM | POA: Diagnosis not present

## 2021-12-10 DIAGNOSIS — E782 Mixed hyperlipidemia: Secondary | ICD-10-CM

## 2021-12-10 DIAGNOSIS — I480 Paroxysmal atrial fibrillation: Secondary | ICD-10-CM

## 2021-12-10 DIAGNOSIS — I5032 Chronic diastolic (congestive) heart failure: Secondary | ICD-10-CM

## 2021-12-10 DIAGNOSIS — I495 Sick sinus syndrome: Secondary | ICD-10-CM | POA: Diagnosis not present

## 2021-12-10 DIAGNOSIS — I739 Peripheral vascular disease, unspecified: Secondary | ICD-10-CM

## 2021-12-10 DIAGNOSIS — E785 Hyperlipidemia, unspecified: Secondary | ICD-10-CM

## 2021-12-10 NOTE — Patient Instructions (Addendum)
Monitor blood pressure at home Call if low Please check to see if you're on isosorbide at home   Medication Instructions:  No changes  If you need a refill on your cardiac medications before your next appointment, please call your pharmacy.   Lab work: No new labs needed  Testing/Procedures: No new testing needed  Follow-Up: At [facility], you and your health needs are our priority.  As part of our continuing mission to provide you with exceptional heart care, we have created designated Provider Care Teams.  These Care Teams include your primary Cardiologist (physician) and Advanced Practice Providers (APPs -  Physician Assistants and Nurse Practitioners) who all work together to provide you with the care you need, when you need it.  You will need a follow up appointment in 6 months  Providers on your designated Care Team:   Murray Hodgkins, NP Christell Faith, PA-C Cadence Kathlen Mody, [place]  COVID-19 Vaccine Information can be found at: [email or web address] For questions related to vaccine distribution or appointments, please email [email or web address] or call [phone].

## 2021-12-11 ENCOUNTER — Other Ambulatory Visit (HOSPITAL_COMMUNITY): Payer: Self-pay

## 2021-12-12 ENCOUNTER — Ambulatory Visit
Admission: RE | Admit: 2021-12-12 | Discharge: 2021-12-12 | Disposition: A | Discharge status: Home / Self Care | Payer: Medicare HMO | Source: Ambulatory Visit | Attending: Internal Medicine | Admitting: Internal Medicine

## 2021-12-12 DIAGNOSIS — C9110 Chronic lymphocytic leukemia of B-cell type not having achieved remission: Secondary | ICD-10-CM | POA: Insufficient documentation

## 2021-12-12 IMAGING — CT CT CHEST-ABD-PELV W/ CM
2 of 5 series · 13 of 36 positions shown, 15 images · IV contrast (agent unspecified)
Comparison: Multiple priors including CT [DATE]

CLINICAL DATA: Chronic lymphocytic leukemia, assess treatment
response. * Tracking Code: BO *

EXAM:
CT CHEST, ABDOMEN, AND PELVIS WITH CONTRAST
TECHNIQUE: Multidetector CT imaging of the chest, abdomen and pelvis was
performed following the standard protocol during bolus
administration of intravenous contrast.

[Series 3: cap with · axial · 0.89mm/px · z∈[-825,-210]mm · 10 of 143 slices shown, 12 images]
[im 10/143  mediastinal]
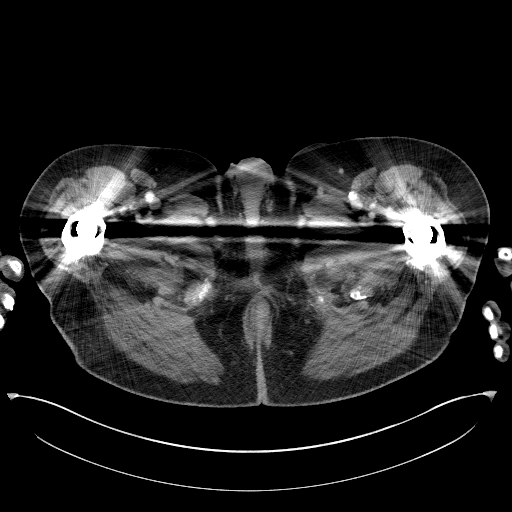
[im 10/143  bone]
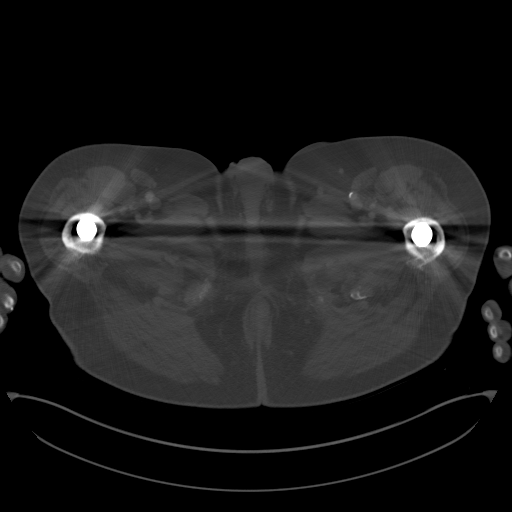
[im 29/143  mediastinal]
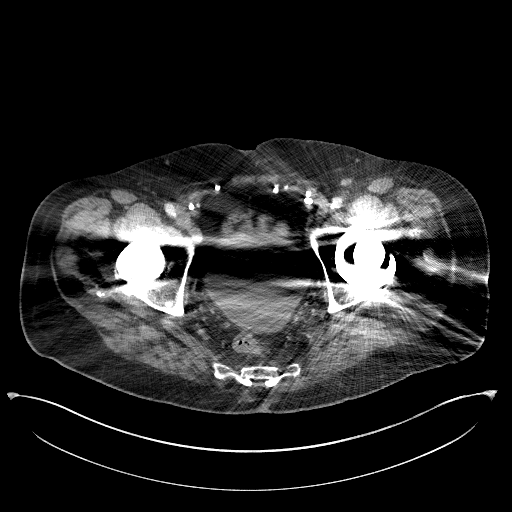
[im 38/143  mediastinal]
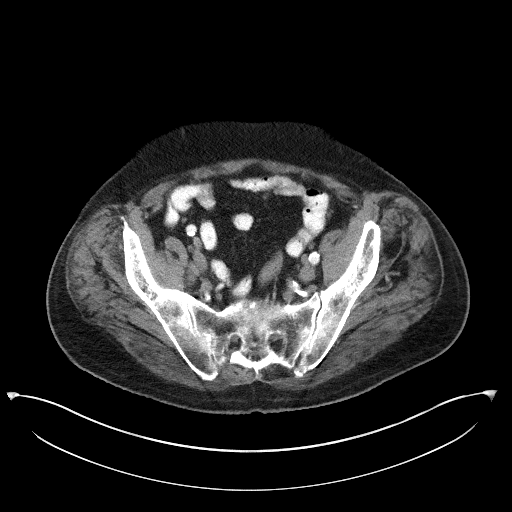
[im 48/143  mediastinal]
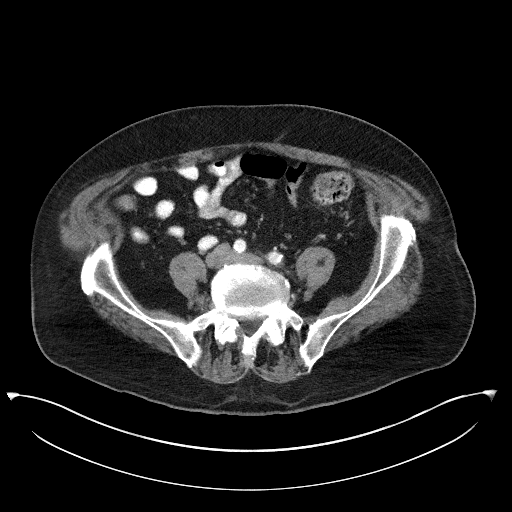
[im 67/143  mediastinal]
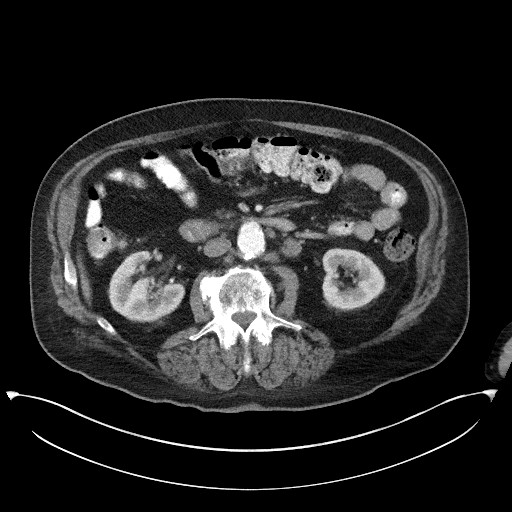
[im 76/143  mediastinal]
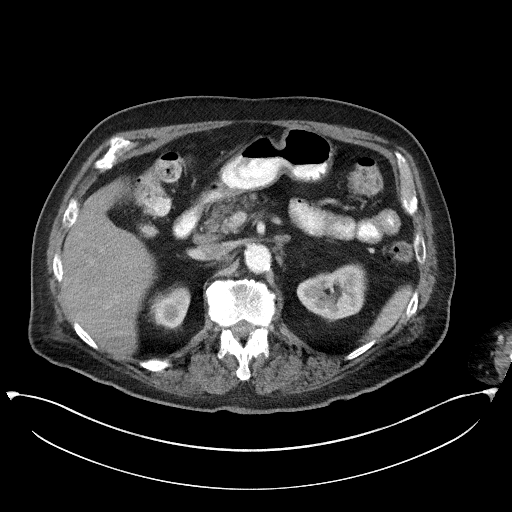
[im 95/143  mediastinal]
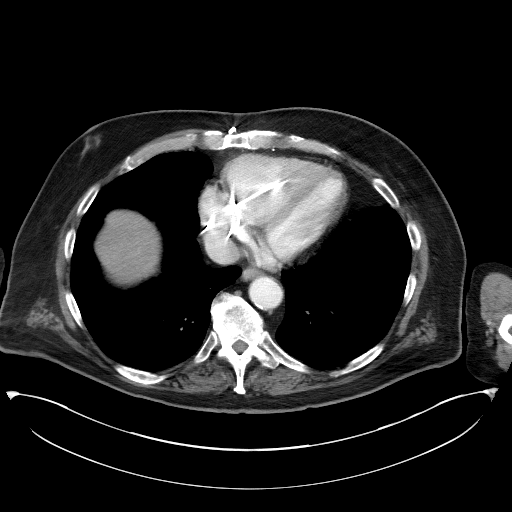
[im 105/143  mediastinal]
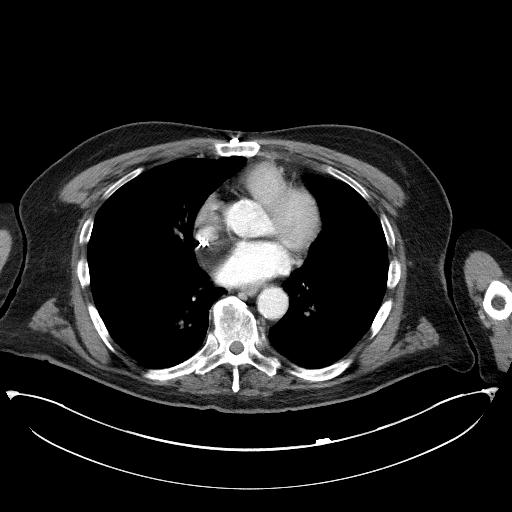
[im 114/143  mediastinal]
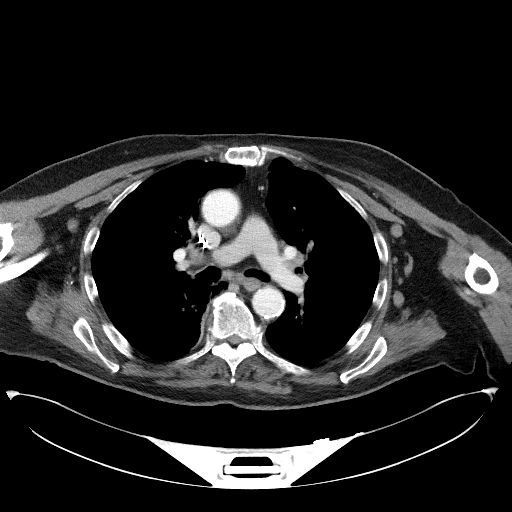
[im 114/143  bone]
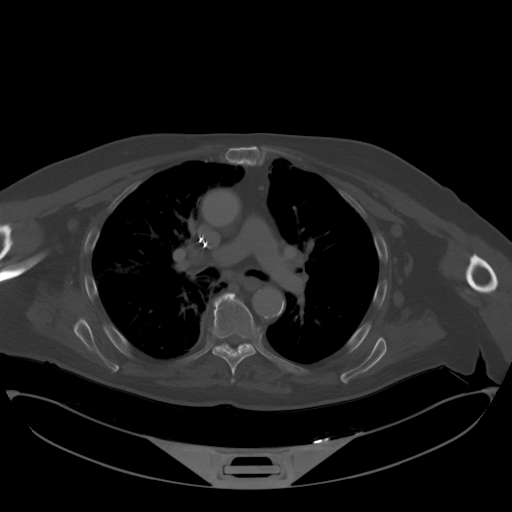
[im 133/143  mediastinal]
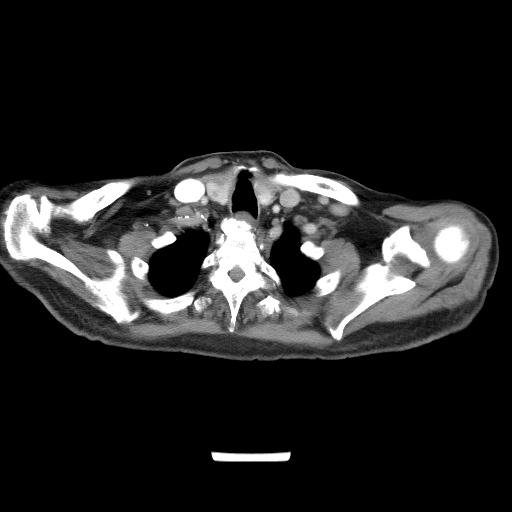

[Series 6: coronals (person_name) · coronal · 1.00mm/px · 3 of 161 slices shown]
[im 33/161  mediastinal]
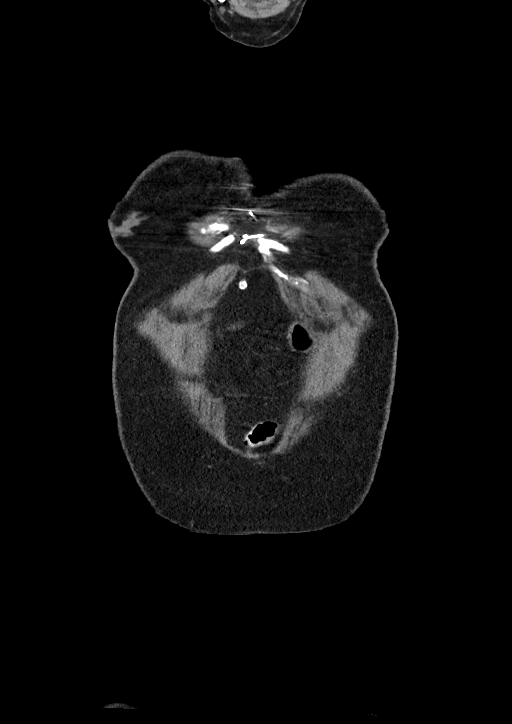
[im 65/161  mediastinal]
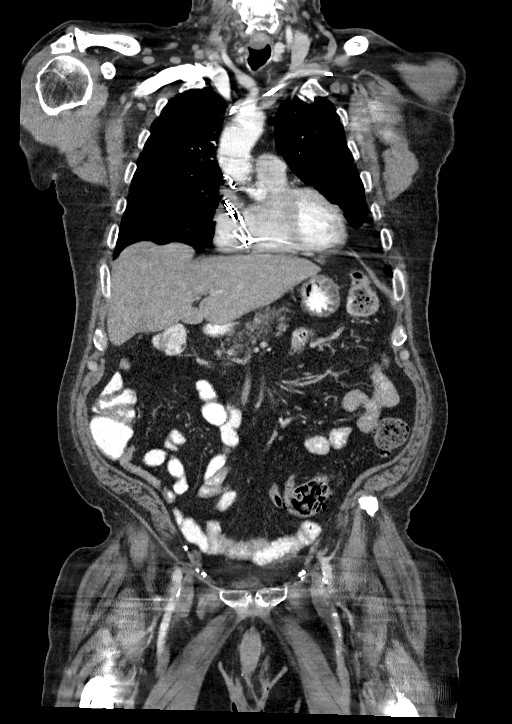
[im 97/161  mediastinal]
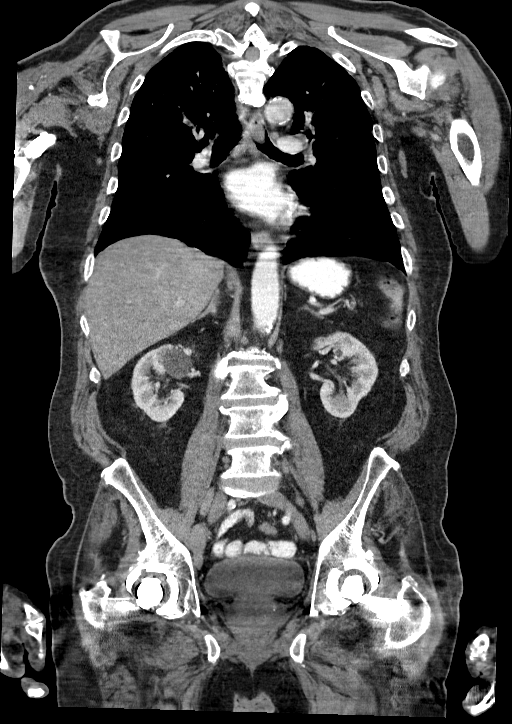

[13 of 36 positions shown; findings below may reference images not displayed]

RADIATION DOSE REDUCTION: This exam was performed according to the
departmental dose-optimization program which includes automated
exposure control, adjustment of the mA and/or kV according to
patient size and/or use of iterative reconstruction technique.

CONTRAST:  100mL OMNIPAQUE IOHEXOL 300 MG/ML  SOLN
FINDINGS: CT CHEST FINDINGS

Cardiovascular: Left chest cardiac generator with leads in the right
atrium and right ventricle. Prior median sternotomy and CABG. Aortic
atherosclerosis without aneurysmal dilation. No central pulmonary
embolus on this nondedicated study. Normal size heart. No
significant pericardial effusion/thickening.

Mediastinum/Nodes: Increased size of retroclavicular, axillary and
subpectoral lymph nodes. Index lymph nodes are as follows:

-Left axillary lymph node measures 17 mm in short axis on image 36/3
previously 8 mm.

-Left retroclavicular/axillary lymph node measures 11 mm in short
axis on image [DATE] previously 5 mm.

-Right subpectoral lymph node measures 11 mm in short axis on image
[DATE] previously 6 mm.

Prominent mediastinal and hilar lymph nodes are not pathologically
enlarged by size criteria and appears similar to prior.

No suspicious thyroid nodule. The esophagus is grossly unremarkable.

Lungs/Pleura: Motion degraded examination of the lungs. New
scattered bilateral pulmonary nodules measure up to 5 mm for
instance in the right upper lobe on image [DATE] measuring 5 mm and in
the right middle lobe measuring 5 mm on image 42/5. No pleural
effusion. No pneumothorax.

Musculoskeletal: No aggressive lytic or blastic lesion of bone.
Multilevel degenerative changes spine. Degenerative changes
bilateral shoulders.

CT ABDOMEN PELVIS FINDINGS

Hepatobiliary: No suspicious hepatic lesion. Gallbladder surgically
absent. No biliary ductal dilation.

Pancreas: No pancreatic ductal dilation or evidence of acute
inflammation.

Spleen: No splenomegaly or focal splenic lesion.

Adrenals/Urinary Tract: Stable 1 cm right adrenal nodule present
dating back to PET-CT [DATE] and consistent with a benign
adrenal adenoma requiring no follow-up. Left adrenal gland appears
normal.

No hydronephrosis. Benign right renal sinus cyst requires no
follow-up. No suspicious renal mass. Evaluation of the urinary
bladder is limited by nondistention and streak artifact from
bilateral hip arthroplasties.

Stomach/Bowel: Radiopaque enteric contrast material traverses the
hepatic flexure. Stomach is unremarkable for degree of distension.
No pathologic dilation of small or large bowel. No evidence of acute
bowel inflammation. Scattered left-sided colonic diverticulosis
without findings of acute diverticulitis.

Vascular/Lymphatic: Aortic and branch vessel atherosclerosis without
abdominal aortic aneurysm.

No significant interval change in the prominent/mildly enlarged
abdominal and pelvic lymph nodes. No new or progressive adenopathy.
Index lymph nodes are as follows:

-left periaortic lymph node at the level of the kidneys measures 12
mm in short axis on image 79/3, unchanged.

-right external iliac lymph node measures 9 mm in short axis on
image 111/3, unchanged.

Reproductive: Evaluation is limited by streak artifact from
bilateral hip arthroplasties.

Other: No significant abdominopelvic free fluid. Bilateral inguinal
hernia repairs.

Musculoskeletal: Bilateral total hip arthroplasties. Multilevel
degenerative changes spine. No aggressive lytic or blastic lesion of
bone.
IMPRESSION: 1. Increased adenopathy above the diaphragm with stable
prominent/mildly enlarged lymph nodes below the diaphragm and no
splenomegaly.
2. New scattered bilateral pulmonary nodules measure up to 5 mm and
are present on a hazy background of ground-glass opacity, favored to
reflect infectious or inflammatory process. However, disease
involvement is not entirely excluded. Consider attention on
short-term interval follow-up dedicated chest CT.
3.  Aortic Atherosclerosis ([PX]-[PX]).

## 2021-12-12 IMAGING — CT CT NECK W/ CM
4 of 5 series · 13 of 33 positions shown, 15 images · IV contrast (agent unspecified)
Comparison: Head and neck CTA [DATE]. Soft tissue neck CT
[DATE].

CLINICAL DATA: Chronic lymphocytic leukemia.  Neck mass.

EXAM:
CT NECK WITH CONTRAST
TECHNIQUE: Multidetector CT imaging of the neck was performed using the
standard protocol following the bolus administration of intravenous
contrast.

[Series 5: axial bone · axial · 0.65mm/px · z∈[-216,-94]mm · 3 of 123 slices shown, 4 images]
[im 31/123  soft-tissue]
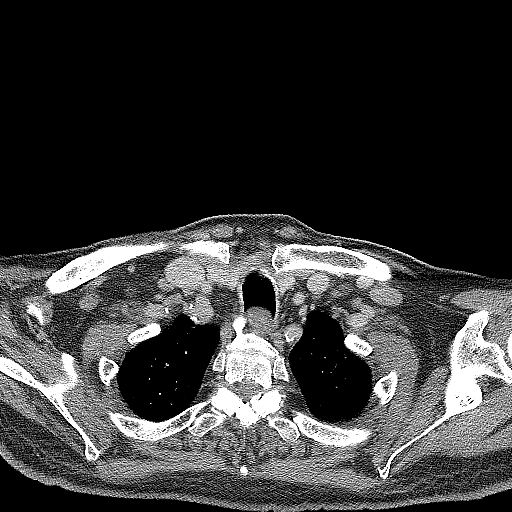
[im 31/123  bone]
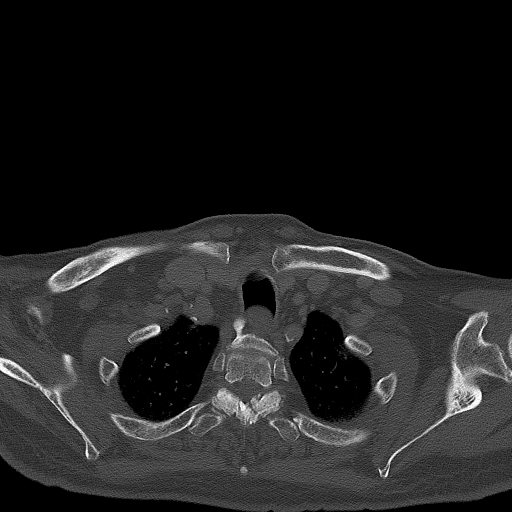
[im 62/123  bone]
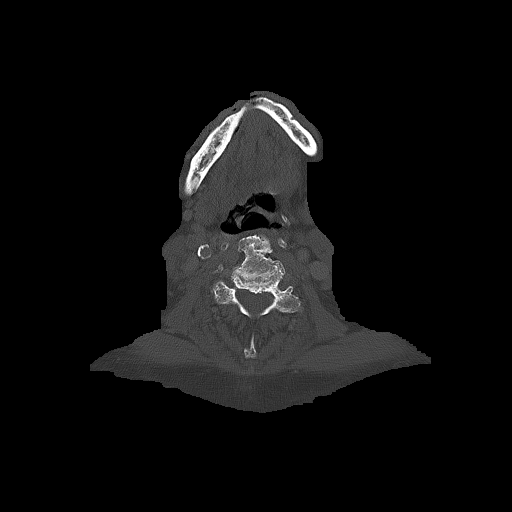
[im 92/123  bone]
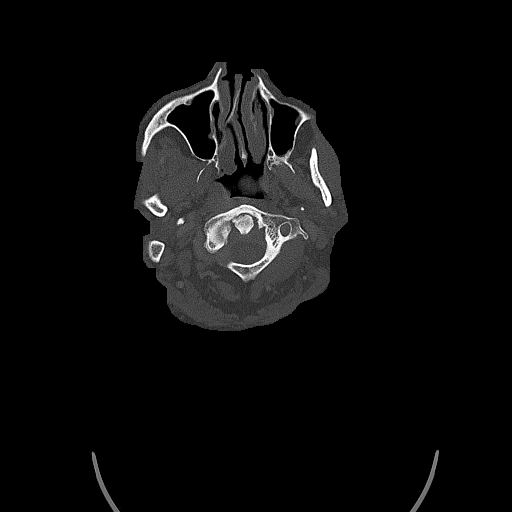

[Series 6: sag neck · sagittal · 0.52mm/px · 5 of 120 slices shown, 6 images]
[im 40/120  bone]
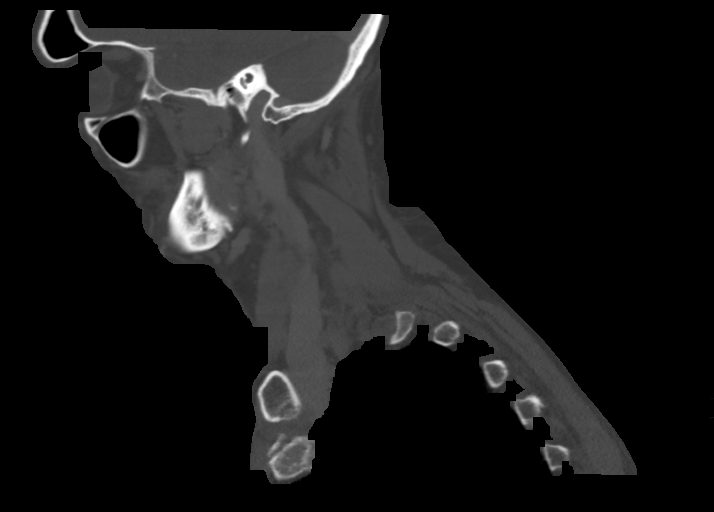
[im 50/120  bone]
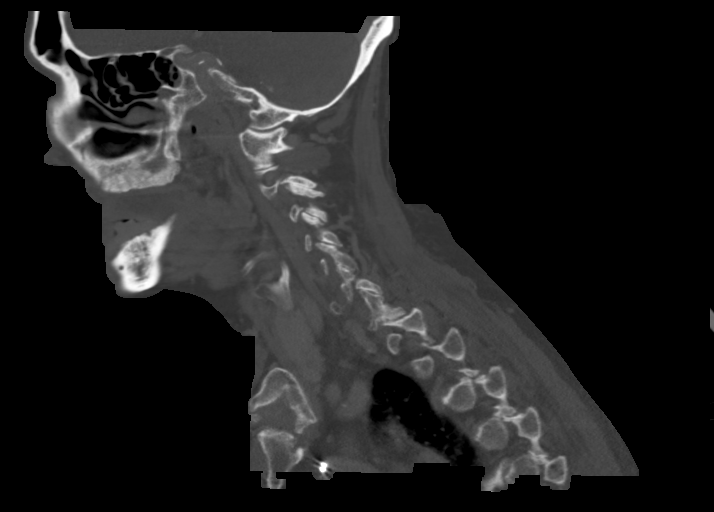
[im 60/120  soft-tissue]
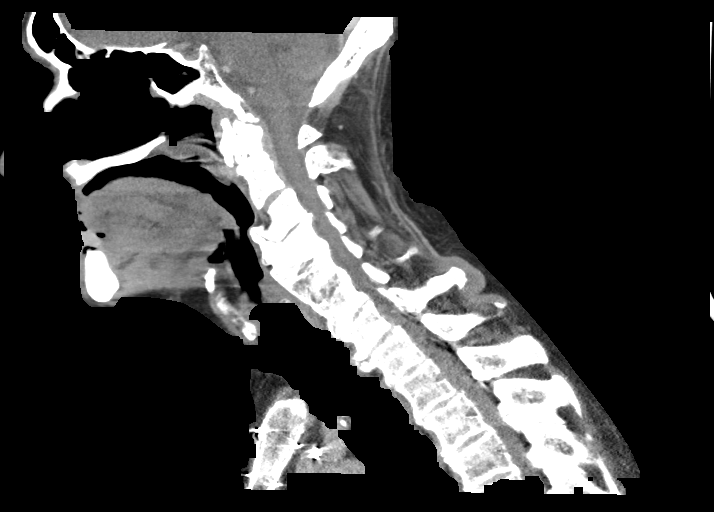
[im 60/120  bone]
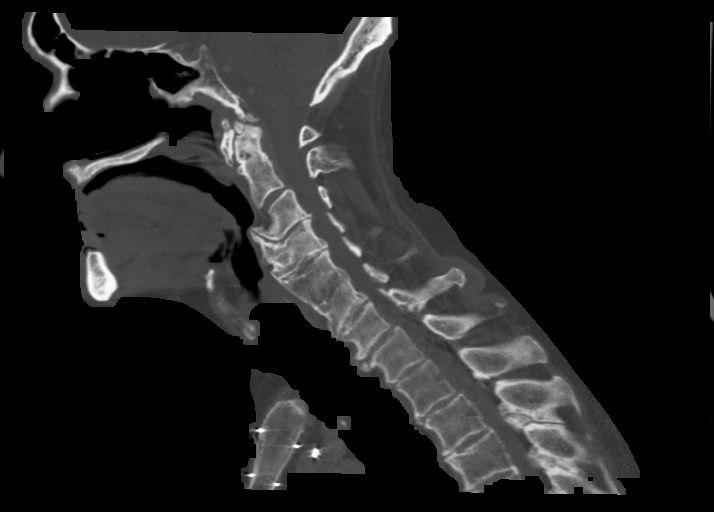
[im 70/120  bone]
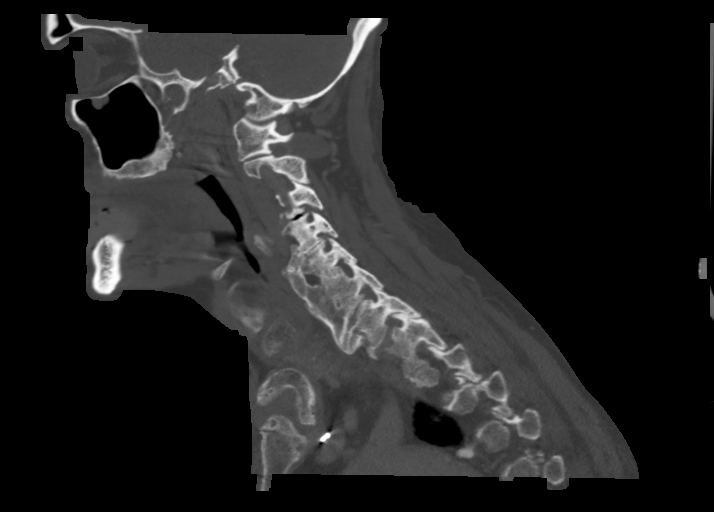
[im 80/120  bone]
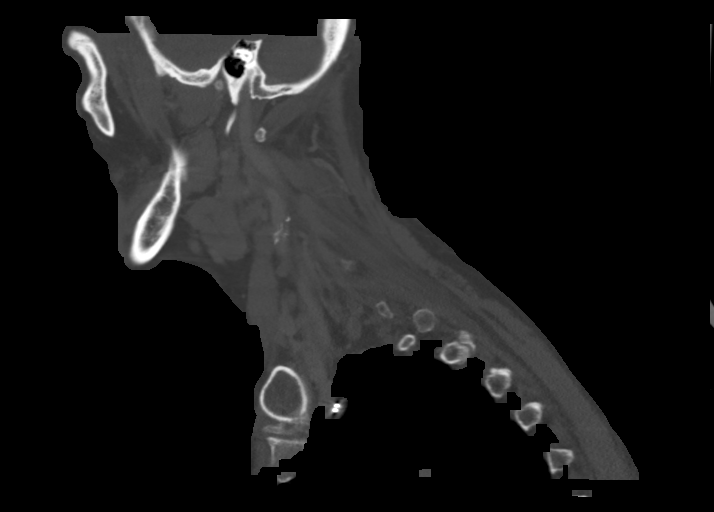

[Series 7: cor neck · coronal · 0.47mm/px · 3 of 182 slices shown]
[im 37/182  bone]
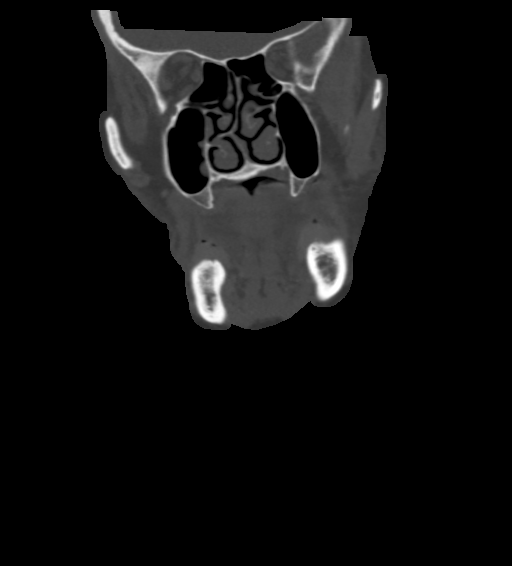
[im 73/182  bone]
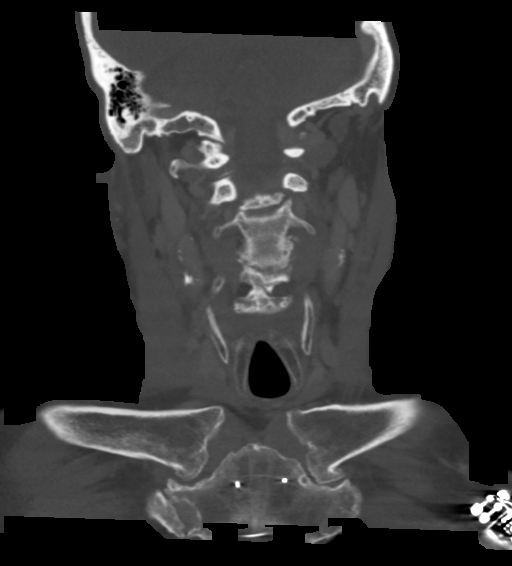
[im 109/182  bone]
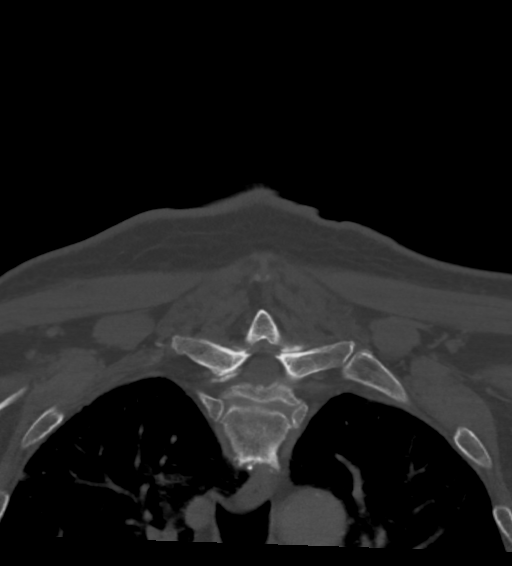

[Series 8: orthogonal (person_name) · axial · 0.47mm/px · z∈[-226,-162]mm · 2 of 130 slices shown]
[im 33/130  bone]
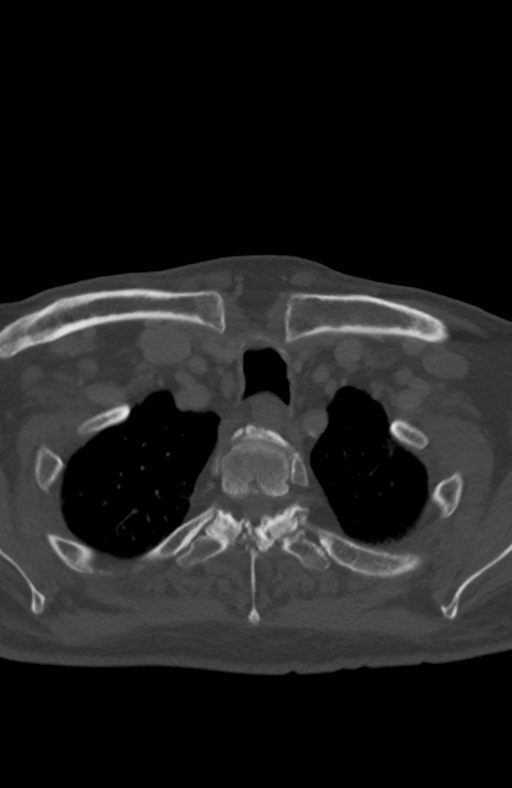
[im 65/130  bone]
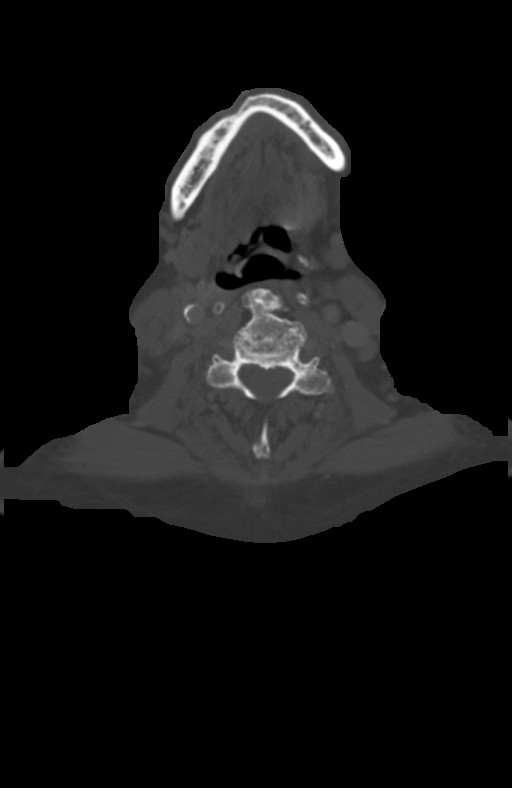

[13 of 33 positions shown; findings below may reference images not displayed]

RADIATION DOSE REDUCTION: This exam was performed according to the
departmental dose-optimization program which includes automated
exposure control, adjustment of the mA and/or kV according to
patient size and/or use of iterative reconstruction technique.

CONTRAST:  100mL OMNIPAQUE IOHEXOL 300 MG/ML  SOLN
FINDINGS: Pharynx and larynx: Moderate motion artifact.  No mass identified.

Salivary glands: No inflammation, mass, or stone.

Thyroid: Unremarkable.

Lymph nodes: Scattered borderline to mildly enlarged lymph nodes in
the neck bilaterally have increased in size from [DATE],
including a 10 mm short axis left level IIa lymph node, level V
lymph nodes measuring up to 9 mm on the right and 7 mm on the left,
and retroclavicular lymph nodes measuring up to 10 mm bilaterally.

Vascular: Calcific atherosclerosis at the carotid bifurcations.

Limited intracranial: Unremarkable.

Visualized orbits: Unremarkable.

Mastoids and visualized paranasal sinuses: Mild mucosal thickening
in the paranasal sinuses. Clear mastoid air cells.

Skeleton: No suspicious osseous lesion. Bulky anterior vertebral
osteophytes in the cervical spine with interbody ankylosis from
C4-C7. Moderately advanced median C1-2 arthropathy.

Upper chest: Reported separately.

Other: None.
IMPRESSION: Scattered lymph nodes in the neck and retroclavicular regions which
have enlarged from [DATE] and measure up to 1 cm in short axis.

## 2021-12-12 MED ORDER — IOHEXOL 300 MG/ML  SOLN
100.0000 mL | Freq: Once | INTRAMUSCULAR | Status: AC | PRN
  Administered 2021-12-12: 100 mL via INTRAVENOUS

## 2021-12-17 ENCOUNTER — Telehealth: Payer: Self-pay | Admitting: Neurology

## 2021-12-17 ENCOUNTER — Ambulatory Visit: Payer: Medicare HMO | Admitting: Neurology

## 2021-12-17 NOTE — Telephone Encounter (Signed)
Noted  

## 2021-12-17 NOTE — Telephone Encounter (Signed)
Pt said, went to the wrong medical office. Informed pt would have to reschedule appt. Pt said, if have to reschedule do not want to make another appt.

## 2021-12-19 ENCOUNTER — Encounter: Payer: Self-pay | Admitting: Internal Medicine

## 2021-12-19 ENCOUNTER — Inpatient Hospital Stay: Payer: Medicare HMO | Attending: Internal Medicine

## 2021-12-19 ENCOUNTER — Inpatient Hospital Stay (HOSPITAL_BASED_OUTPATIENT_CLINIC_OR_DEPARTMENT_OTHER): Payer: Medicare HMO | Admitting: Internal Medicine

## 2021-12-19 DIAGNOSIS — R197 Diarrhea, unspecified: Secondary | ICD-10-CM | POA: Diagnosis not present

## 2021-12-19 DIAGNOSIS — D649 Anemia, unspecified: Secondary | ICD-10-CM | POA: Diagnosis not present

## 2021-12-19 DIAGNOSIS — Z79899 Other long term (current) drug therapy: Secondary | ICD-10-CM | POA: Insufficient documentation

## 2021-12-19 DIAGNOSIS — Z87891 Personal history of nicotine dependence: Secondary | ICD-10-CM | POA: Insufficient documentation

## 2021-12-19 DIAGNOSIS — Z8673 Personal history of transient ischemic attack (TIA), and cerebral infarction without residual deficits: Secondary | ICD-10-CM | POA: Diagnosis not present

## 2021-12-19 DIAGNOSIS — I509 Heart failure, unspecified: Secondary | ICD-10-CM | POA: Diagnosis not present

## 2021-12-19 DIAGNOSIS — C9110 Chronic lymphocytic leukemia of B-cell type not having achieved remission: Secondary | ICD-10-CM

## 2021-12-19 DIAGNOSIS — R918 Other nonspecific abnormal finding of lung field: Secondary | ICD-10-CM | POA: Diagnosis not present

## 2021-12-19 DIAGNOSIS — Z7901 Long term (current) use of anticoagulants: Secondary | ICD-10-CM | POA: Insufficient documentation

## 2021-12-19 DIAGNOSIS — R530 Neoplastic (malignant) related fatigue: Secondary | ICD-10-CM | POA: Insufficient documentation

## 2021-12-19 DIAGNOSIS — I4891 Unspecified atrial fibrillation: Secondary | ICD-10-CM | POA: Diagnosis not present

## 2021-12-19 DIAGNOSIS — M2550 Pain in unspecified joint: Secondary | ICD-10-CM | POA: Insufficient documentation

## 2021-12-19 LAB — CBC WITH DIFFERENTIAL/PLATELET
Abs Immature Granulocytes: 0.04 10*3/uL (ref 0.00–0.07)
Basophils Absolute: 0.1 10*3/uL (ref 0.0–0.1)
Basophils Relative: 1 %
Eosinophils Absolute: 0 10*3/uL (ref 0.0–0.5)
Eosinophils Relative: 0 %
HCT: 35.4 % — ABNORMAL LOW (ref 39.0–52.0)
Hemoglobin: 10.9 g/dL — ABNORMAL LOW (ref 13.0–17.0)
Immature Granulocytes: 0 %
Lymphocytes Relative: 42 %
Lymphs Abs: 3.9 10*3/uL (ref 0.7–4.0)
MCH: 30.7 pg (ref 26.0–34.0)
MCHC: 30.8 g/dL (ref 30.0–36.0)
MCV: 99.7 fL (ref 80.0–100.0)
Monocytes Absolute: 0.8 10*3/uL (ref 0.1–1.0)
Monocytes Relative: 9 %
Neutro Abs: 4.5 10*3/uL (ref 1.7–7.7)
Neutrophils Relative %: 48 %
Platelets: 261 10*3/uL (ref 150–400)
RBC: 3.55 MIL/uL — ABNORMAL LOW (ref 4.22–5.81)
RDW: 15.5 % (ref 11.5–15.5)
WBC: 9.3 10*3/uL (ref 4.0–10.5)
nRBC: 0 % (ref 0.0–0.2)

## 2021-12-19 LAB — COMPREHENSIVE METABOLIC PANEL
ALT: 26 U/L (ref 0–44)
AST: 30 U/L (ref 15–41)
Albumin: 3.1 g/dL — ABNORMAL LOW (ref 3.5–5.0)
Alkaline Phosphatase: 119 U/L (ref 38–126)
Anion gap: 7 (ref 5–15)
BUN: 15 mg/dL (ref 8–23)
CO2: 28 mmol/L (ref 22–32)
Calcium: 8.5 mg/dL — ABNORMAL LOW (ref 8.9–10.3)
Chloride: 102 mmol/L (ref 98–111)
Creatinine, Ser: 0.92 mg/dL (ref 0.61–1.24)
GFR, Estimated: >60 mL/min (ref >60)
Glucose, Bld: 132 mg/dL — ABNORMAL HIGH (ref 70–99)
Potassium: 3.6 mmol/L (ref 3.5–5.1)
Sodium: 137 mmol/L (ref 135–145)
Total Bilirubin: 0.5 mg/dL (ref 0.3–1.2)
Total Protein: 7 g/dL (ref 6.5–8.1)

## 2021-12-19 LAB — LACTATE DEHYDROGENASE: LDH: 174 U/L (ref 98–192)

## 2021-12-19 NOTE — Progress Notes (Signed)
[place] OFFICE PROGRESS NOTE  Patient Care Team: Leone Haven, MD as PCP - General (Family Medicine) Rockey Situ Kathlene November, MD as PCP - Cardiology (Cardiology) [name], MD as Consulting Physician (Cardiology) Cammie Sickle, MD as Medical Oncologist (Hematology and Oncology)   Cancer Staging  No matching staging information was found for the patient.   Oncology History Overview Note  # AUG 2015- SLL/CLL [Right Ax Ln Bx] s/p Benda-Rituxan x6 [finished March 2016]; Maintenance Rituxan q 19m [started April 2016; Dr.Pandit];Last Ritux Jan 2017.  MARCH 2017- CT N/C/A/P- NED. STOP Ritux; surveillance   # AUG 2019- CT/PET- progression/NO transformation;  # NOV 2019- Progression; started Ibrutinib 420 mg/d. STOPPED in end of feb sec to extreme fatigue/joint pains/cramps  #November 01, 2018-start ibrutinib 280 mg a day; December 20, 2018-discontinue ibrutinib secondary multiple side effects.   #January 03, 2019 start Gazyva + Ven [July]; finished Dec 2020-; started [facility] Fiscal maintenance [200 mg a day; DI-amiodarone]; SEP 2022- congestive heart failure; s/p cardiac cath-noted to have CAD however no stents placed.  2D echo- EF-55%  #Mid March 2021-venetoclax dose reduced to 100 mg [second diarrhea].   #? 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; IGVH- UN-MUTATED [poor prognosis]  SURVIVORSHIP: p  DIAGNOSIS: CLL   STAGE: IV  ;GOALS: control  CURRENT/MOST RECENT THERAPY : venetoclax     CLL (chronic lymphocytic leukemia) ([place])  01/03/2019 -  Chemotherapy   The patient had obinutuzumab (GAZYVA) 100 mg in sodium chloride 0.9 % 100 mL (0.9615 mg/mL) chemo infusion, 100  mg, Intravenous, Once, 6 of 6 cycles Administration: 100 mg (01/03/2019), 900 mg (01/04/2019), 1,000 mg (01/10/2019), 1,000 mg (01/31/2019), 1,000 mg (01/17/2019), 1,000 mg (02/28/2019), 1,000 mg (04/07/2019), 1,000 mg (05/05/2019), 1,000 mg (06/02/2019)   for chemotherapy treatment.       INTERVAL HISTORY: Accompanied by his wife.   Ambulating with a cane.   [name] 76 y.o.  male CLL-high risk currently on venetoclax re-started May 1st, 2023 [facility] on hold for the last 3 months because of multiple hospitalizations]; started is here for follow-up/review results of the CT scan.   Patient denies any worsening lumps or bumps.   Complains of chronic joint pain.  Chronic fatigue slightly worse since starting on the venetoclax.  Otherwise no fever no chills.  No nausea no vomiting.   No headache.  No weight loss.  No night sweats.  Patient has not had any further hospitalizations.  \Review of Systems  Constitutional:  Positive for malaise/fatigue. Negative for chills, diaphoresis and fever.  HENT:  Negative for nosebleeds and sore throat.   Eyes:  Negative for double vision.  Respiratory:  Negative for hemoptysis, shortness of breath and wheezing.   Cardiovascular:  Negative for chest pain, palpitations, orthopnea and leg swelling.  Gastrointestinal:  Positive for diarrhea. Negative for abdominal pain, blood in stool, constipation, heartburn, melena, nausea and vomiting.  Genitourinary:  Negative for dysuria, frequency and urgency.  Musculoskeletal:  Positive for back pain, joint pain and myalgias.  Skin: Negative.  Negative for itching and rash.  Neurological:  Negative for dizziness, tingling, focal weakness and headaches.  Psychiatric/Behavioral:  Negative for depression. The patient is not nervous/anxious and does not have  insomnia.      PAST MEDICAL HISTORY :  Past Medical History:  Diagnosis Date   Anxiety    Arthritis    Atrial fibrillation ([place])    a. Dx 2013, recurred  02/2014, CHA2DS2VASc = 3 -->placed on Eliquis;  b. 02/2014 Echo: EF 50-55%, mid and apical anterior septum and mid and apical inf septum are abnl, mild to mod Ao sclerosis w/o AS.   Cancer associated pain    Chicken pox    Chronic lymphocytic leukemia ([place])    a. Dx 02/2014.   CLL (chronic lymphocytic leukemia) ([facility])    Complication of anesthesia    History of  PTSD--do not touch patient when waking up from surgery.   COPD (chronic obstructive pulmonary disease) ([facility])    Coronary artery disease    a. 04/2009 CABG x 3 (LIMA->LAD, VG->OM1, VG->PDA);  b. 09/2009 Cath: occluded VG x 2 w/ patent LIMA and L->R collats. EF 55%, mild antlat HK;  c. 10/2011 MV: EF 53%, no isch/infarct-->low risk.   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   TIA (transient ischemic attack) 11/02/2015    PAST SURGICAL HISTORY :   Past Surgical History:  Procedure Laterality Date   ABDOMINAL HERNIA REPAIR     APPENDECTOMY  06/21/1985   CARDIAC CATHETERIZATION  2010; 2011   ; Dr Fletcher Anon   CORONARY ARTERY BYPASS GRAFT  04/2009   "CABG X3"   EP IMPLANTABLE DEVICE N/A 03/03/2016   Procedure: Pacemaker Implant;  Surgeon: Deboraha Sprang, MD;  Location: [facility];  Service: Cardiovascular;  Laterality: N/A;   FOREIGN BODY REMOVAL  1968   "shrapnel in my tailbone"   INGUINAL HERNIA REPAIR Right    INSERT / REPLACE / [place] Right 2018   LAPAROSCOPIC CHOLECYSTECTOMY     RIGHT/LEFT HEART CATH AND CORONARY/GRAFT ANGIOGRAPHY N/A 02/04/2021    Procedure: RIGHT/LEFT HEART CATH AND CORONARY/GRAFT ANGIOGRAPHY;  Surgeon: Nelva Bush, MD;  Location: [place];  Service: Cardiovascular;  Laterality: N/A;   TONSILLECTOMY AND ADENOIDECTOMY  1956   TOTAL HIP ARTHROPLASTY Right 10/22/2016   Procedure: TOTAL HIP ARTHROPLASTY;  Surgeon: Dereck Leep, MD;  Location: [facility];  Service: Orthopedics;  Laterality: Right;   TOTAL HIP ARTHROPLASTY Left 11/04/2017   Procedure: TOTAL HIP ARTHROPLASTY;  Surgeon: Dereck Leep, MD;  Location: [facility];  Service: Orthopedics;  Laterality: Left;    FAMILY HISTORY :   Family History  Problem Relation Age of Onset   Heart disease Mother    Heart attack Mother    Coronary artery disease Other        family history    SOCIAL HISTORY:   Social History   Tobacco Use   Smoking status: Former    Packs/day: 1.00    Years: 40.00    Pack years: 40.00    Types: Cigarettes    Quit date: 07/21/2006    Years since  quitting: 15.4   Smokeless tobacco: Never  Vaping Use   Vaping Use: Former  Substance Use Topics   Alcohol use: Not Currently   Drug use: No    ALLERGIES:  has No Known Allergies.  MEDICATIONS:  Current Outpatient Medications  Medication Sig Dispense Refill   acetaminophen (TYLENOL) 325 MG tablet Take 1-2 tablets (325-650 mg total) by mouth every 4 (four) hours as needed for mild pain.     acyclovir (ZOVIRAX) 400 MG tablet TAKE 1 TABLET(400 MG) BY MOUTH TWICE DAILY 60 tablet 6   albuterol (VENTOLIN HFA) 108 (90 Base) MCG/ACT inhaler Inhale 2 puffs into the lungs every 6 (six) hours as needed for wheezing or shortness of breath. 1 each 0   Alirocumab (PRALUENT) 150 MG/ML SOAJ Inject 150 mg into the skin every 14 (fourteen) days. 2 mL 12   amiodarone (PACERONE) 200 MG tablet Take 1/2 tablet (100 mg) by mouth twice daily 5 days a week 60 tablet 6   apixaban (ELIQUIS) 5 MG TABS tablet Take 5 mg by mouth 2 (two) times daily.     azelastine (ASTELIN) 0.1 % nasal spray Place 2  sprays into both nostrils 2 (two) times daily. Use in each nostril as directed 30 mL 1   budesonide-formoterol (SYMBICORT) 160-4.5 MCG/ACT inhaler Inhale 2 puffs into the lungs 2 (two) times daily. 1 Inhaler 11   cetirizine (ZYRTEC) 10 MG tablet Take 10 mg by mouth daily as needed for allergies.      Coenzyme Q10 (COQ10) 200 MG CAPS Take 200 mg by mouth daily.     ezetimibe (ZETIA) 10 MG tablet Take 1 tablet (10 mg total) by mouth daily. 90 tablet 3   furosemide (LASIX) 40 MG tablet Take 1 tablet (40 mg total) by mouth daily. 30 tablet 0   guaiFENesin-dextromethorphan (ROBITUSSIN DM) 100-10 MG/5ML syrup Take 5 mLs by mouth every 4 (four) hours as needed for cough. 118 mL 0   Krill Oil 350 MG CAPS Take 350 mg by mouth daily as needed (Heart).     mirtazapine (REMERON) 15 MG tablet Take 15 mg by mouth at bedtime as needed (for panic associated with PTSD).      Multiple Vitamin (MULTIVITAMIN WITH MINERALS) TABS tablet Take 1 tablet by mouth daily.     nitroGLYCERIN (NITROSTAT) 0.4 MG SL tablet Place 1 tablet (0.4 mg total) under the tongue every 5 (five) minutes as needed for chest pain. 25 tablet 6   Tiotropium Bromide Monohydrate 2.5 MCG/ACT AERS Inhale 2 puffs into the lungs at bedtime. Spiriva     traMADol (ULTRAM) 50 MG tablet Take 1 tablet (50 mg total) by mouth every 12 (twelve) hours as needed. 14 tablet 0   venetoclax (VENCLEXTA) 100 MG tablet Take 100 mg by mouth daily. Tablets should be swallowed whole with a meal and a full glass of water.     No current facility-administered medications for this visit.    PHYSICAL EXAMINATION: ECOG PERFORMANCE STATUS: 1 - Symptomatic but completely ambulatory  BP 101/61 (BP Location: Left Arm, Patient Position: Sitting, Cuff Size: Normal)   Pulse 81   Temp (!) 97.1 F (36.2 C)   Ht $R'5\' 9"'Ak$  (1.753 m)   Wt 204 lb 12.8 oz (92.9 kg)   SpO2 95%   BMI 30.24 kg/m   Filed Weights   12/19/21 1009  Weight: 204 lb 12.8 oz (92.9 kg)   1 cm size left  neck adenopathy felt/jugulodigastric  Physical Exam HENT:  Head: Normocephalic and atraumatic.     Mouth/Throat:     Pharynx: No oropharyngeal exudate.  Eyes:     Pupils: Pupils are equal, round, and reactive to light.  Cardiovascular:     Rate and Rhythm: Normal rate and regular rhythm.  Pulmonary:     Effort: No respiratory distress.     Breath sounds: No wheezing.  Abdominal:     General: Bowel sounds are normal. There is no distension.     Palpations: Abdomen is soft. There is no mass.     Tenderness: There is no abdominal tenderness. There is no guarding or rebound.  Musculoskeletal:        General: No tenderness. Normal range of motion.     Cervical back: Normal range of motion and neck supple.  Skin:    General: Skin is warm.  Neurological:     Mental Status: He is alert and oriented to person, place, and time.  Psychiatric:        Mood and Affect: Affect normal.    LABORATORY DATA:  I have reviewed the data as listed    Component Value Date/Time   NA 137 12/19/2021 0916   NA 143 03/11/2021 1136   NA 139 10/11/2014 1800   K 3.6 12/19/2021 0916   K 3.3 (L) 10/11/2014 1800   CL 102 12/19/2021 0916   CL 106 10/11/2014 1800   CO2 28 12/19/2021 0916   CO2 27 10/11/2014 1800   GLUCOSE 132 (H) 12/19/2021 0916   GLUCOSE 107 (H) 10/11/2014 1800   BUN 15 12/19/2021 0916   BUN 13 03/11/2021 1136   BUN 15 10/11/2014 1800   CREATININE 0.92 12/19/2021 0916   CREATININE 0.89 10/11/2014 1800   CALCIUM 8.5 (L) 12/19/2021 0916   CALCIUM 8.8 (L) 10/11/2014 1800   PROT 7.0 12/19/2021 0916   PROT 6.7 05/18/2017 1048   PROT 6.4 (L) 10/11/2014 1800   ALBUMIN 3.1 (L) 12/19/2021 0916   ALBUMIN 4.3 05/18/2017 1048   ALBUMIN 4.1 10/11/2014 1800   AST 30 12/19/2021 0916   AST 23 10/11/2014 1800   ALT 26 12/19/2021 0916   ALT 22 10/11/2014 1800   ALKPHOS 119 12/19/2021 0916   ALKPHOS 61 10/11/2014 1800   BILITOT 0.5 12/19/2021 0916   BILITOT 0.7 05/18/2017 1048   BILITOT  0.9 10/11/2014 1800   GFRNONAA >60 12/19/2021 0916   GFRNONAA >60 10/11/2014 1800   GFRAA >60 04/12/2020 0959   GFRAA >60 10/11/2014 1800    No results found for: SPEP, UPEP  Lab Results  Component Value Date   WBC 9.3 12/19/2021   NEUTROABS 4.5 12/19/2021   HGB 10.9 (L) 12/19/2021   HCT 35.4 (L) 12/19/2021   MCV 99.7 12/19/2021   PLT 261 12/19/2021      Chemistry      Component Value Date/Time   NA 137 12/19/2021 0916   NA 143 03/11/2021 1136   NA 139 10/11/2014 1800   K 3.6 12/19/2021 0916   K 3.3 (L) 10/11/2014 1800   CL 102 12/19/2021 0916   CL 106 10/11/2014 1800   CO2 28 12/19/2021 0916   CO2 27 10/11/2014 1800   BUN 15 12/19/2021 0916   BUN 13 03/11/2021 1136   BUN 15 10/11/2014 1800   CREATININE 0.92 12/19/2021 0916   CREATININE 0.89 10/11/2014 1800      Component Value Date/Time   CALCIUM 8.5 (L) 12/19/2021 0916   CALCIUM 8.8 (L) 10/11/2014 1800   ALKPHOS 119 12/19/2021  0916   ALKPHOS 61 10/11/2014 1800   AST 30 12/19/2021 0916   AST 23 10/11/2014 1800   ALT 26 12/19/2021 0916   ALT 22 10/11/2014 1800   BILITOT 0.5 12/19/2021 0916   BILITOT 0.7 05/18/2017 1048   BILITOT 0.9 10/11/2014 1800       RADIOGRAPHIC STUDIES: I have personally reviewed the radiological images as listed and agreed with the findings in the report. No results found.   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  MAY 27th, 2023-  Increased adenopathy above the diaphragm with stable prominent/mildly enlarged lymph nodes below the diaphragm and no splenomegaly [however patient has been off venetoclax for 3 months months prior-sec to hospitalizations/etc].   #  continue Venatoclax  At least 3 months; will repeat scan-reimaging in 3 months.  If continued progression is noted would recommend acalabrutinib.   # MAY 2023-CT scan-incidental-  new scattered bilateral  pulmonary nodules measure up to 5 mm and are present on a hazy background of ground-glass opacity, favored to reflect infectious or inflammatory process.  We will again repeat imaging in 3 months.  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  Unrelated to CLL or venetoclax.  #Diarrhea-G-1 ? venatolclax- monitor for now.STABLE.   # Low immunoglobulins IgG 330/recent severe respiratory infection/CLL-s/pI IVIG #4/4 [last April 2022].NOV 2022- IgG > 500.  Monitor for now.  # A.fib-on amiodarone [drug interaction-amiodarone; Dr.Gollan] sinus rhythm-on Eliquis/CHF- lasix 40/d- s/p Cath [AUG, 2022- Echo-55%] -STABLE.  # MIld Anemia Hb ~11-12; June 2022-ferritin 17 iron saturation 33% --STABLE.  # Joint pains/ Muscle pain [improved after stopping statin; shot-ok]-sec to Ventoclax-G-1-2;? R continue tramadol/CBD prn; refilled- -STABLE  # Fatigue: Patient has no other identifiable causes of fatigue at this time.  I had a long discussion with the patient regarding the ability of structured exercise program help treat cancer related fatigue.  Introduced the care program available to cancer patients at [facility]. Patient is interested.  Refer to care program: re: Fatigue   DISPOSITION: # referral to CARE program re: fatigue # referral to nutrition re: poor taste/poor apetite # follow up in 1 month  MD; labs- cbc/cmp/ldh; iron studies/ferritin; haptoglobin; LDH - Dr.B  # I reviewed the blood work- with the patient in detail; also reviewed the imaging independently [as summarized above]; and with the patient in detail.         Orders Placed This Encounter  Procedures   CBC with Differential/Platelet    Standing Status:   Future    Standing Expiration Date:   12/20/2022   Comprehensive metabolic panel    Standing Status:   Future    Standing Expiration Date:   12/20/2022   Lactate dehydrogenase    Standing Status:   Future    Standing Expiration Date:   12/20/2022   Iron and TIBC    Ferritin   Haptoglobin    Standing Status:   Future    Standing Expiration Date:   12/20/2022   AMB REFERRAL TO [facility] CARE    Referral Priority:   Routine    Referral Type:   Consultation    Number of Visits Requested:   1   Ambulatory Referral to [facility] Nutrition    Referral Priority:   Routine    Referral Type:   Consultation    Referral Reason:   Specialty Services Required    Number of Visits Requested:   1  AMB REFERRAL TO [facility] CARE    Referral Priority:   Routine    Referral Type:   Consultation    Referral Reason:   Specialty Services Required    Number of Visits Requested:   1    All questions were answered. The patient knows to call the clinic with any problems, questions or concerns.      Cammie Sickle, MD 12/19/2021 1:26 PM

## 2021-12-19 NOTE — Progress Notes (Signed)
CT results and treatments.

## 2021-12-19 NOTE — Assessment & Plan Note (Addendum)
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  MAY 27th, 2023-  Increased adenopathy above the diaphragm with stable prominent/mildly enlarged lymph nodes below the diaphragm and no splenomegaly [however patient has been off venetoclax for 3 months months prior-sec to hospitalizations/etc].   # continue Venatoclax  At least 3 months; will repeat scan-reimaging in 3 months.  If continued progression is noted would recommend acalabrutinib.   # MAY 2023-CT scan-incidental-  new scattered bilateral pulmonary nodules measure up to 5 mm and are present on a hazy background of ground-glass opacity, favored to reflect infectious or inflammatory process.  We will again repeat imaging in 3 months.  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  Unrelated to CLL or venetoclax.  #Diarrhea-G-1 ? venatolclax- monitor for now.STABLE.   # Low immunoglobulins IgG 330/recent severe respiratory infection/CLL-s/pI IVIG #4/4 [last April 2022].NOV 2022- IgG > 500.  Monitor for now.  # A.fib-on amiodarone [drug interaction-amiodarone; Dr.Gollan] sinus rhythm-on Eliquis/CHF- lasix 40/d- s/p Cath [AUG, 2022- Echo-55%] -STABLE.  # MIld Anemia Hb ~11-12; June 2022-ferritin 17 iron saturation 33% --STABLE.  # Joint pains/ Muscle pain [improved after stopping statin; shot-ok]-sec to Ventoclax-G-1-2;? R continue tramadol/CBD prn; refilled- -STABLE  # Fatigue: Patient has no other identifiable causes of fatigue at this time.  I had a long discussion with the patient regarding the ability of structured exercise program help treat cancer related fatigue.  Introduced the care program available to cancer patients at [facility]. Patient is interested.  Refer to care program: re: Fatigue   DISPOSITION: # referral to CARE program re: fatigue # referral to nutrition re: poor taste/poor apetite # follow up in 1 month  MD; labs- cbc/cmp/ldh; iron studies/ferritin;  haptoglobin; LDH - Dr.B  # I reviewed the blood work- with the patient in detail; also reviewed the imaging independently [as summarized above]; and with the patient in detail.     

## 2021-12-23 ENCOUNTER — Ambulatory Visit (INDEPENDENT_AMBULATORY_CARE_PROVIDER_SITE_OTHER): Payer: Medicare HMO | Admitting: Family Medicine

## 2021-12-23 ENCOUNTER — Encounter: Payer: Self-pay | Admitting: Family Medicine

## 2021-12-23 DIAGNOSIS — Z87891 Personal history of nicotine dependence: Secondary | ICD-10-CM | POA: Insufficient documentation

## 2021-12-23 DIAGNOSIS — R221 Localized swelling, mass and lump, neck: Secondary | ICD-10-CM

## 2021-12-23 DIAGNOSIS — Z7409 Other reduced mobility: Secondary | ICD-10-CM | POA: Insufficient documentation

## 2021-12-23 DIAGNOSIS — H9190 Unspecified hearing loss, unspecified ear: Secondary | ICD-10-CM | POA: Insufficient documentation

## 2021-12-23 DIAGNOSIS — R531 Weakness: Secondary | ICD-10-CM | POA: Diagnosis not present

## 2021-12-23 DIAGNOSIS — K635 Polyp of colon: Secondary | ICD-10-CM | POA: Insufficient documentation

## 2021-12-23 DIAGNOSIS — C9110 Chronic lymphocytic leukemia of B-cell type not having achieved remission: Secondary | ICD-10-CM

## 2021-12-23 DIAGNOSIS — M542 Cervicalgia: Secondary | ICD-10-CM

## 2021-12-23 DIAGNOSIS — K089 Disorder of teeth and supporting structures, unspecified: Secondary | ICD-10-CM | POA: Insufficient documentation

## 2021-12-23 NOTE — Patient Instructions (Signed)
Nice to see you. We will check in with physical therapy issue. Please let me know if your neck does not improve over the next 2 to 3 weeks.

## 2021-12-23 NOTE — Progress Notes (Signed)
[name], MD Phone: [phone]  [name] is a 76 y.o. male who presents today for f/u.  Left-sided weakness: Patient notes he saw neurosurgery.  He is to follow-up with them in July.  He notes this is progressively improving.  He has not had physical therapy in a couple of weeks.  He notes he has talked to the physical therapist and he is not sure why he has not come back out to the house.  He notes the neurosurgeon felt as though it was probably nerve impingement related.  The patient is frustrated that he is not able to do the things he used to do given that the doctors have been restricting him given his weakness.  CLL: Patient followed up with his oncologist.  He had increased lymphadenopathy above the diaphragm.  Patient notes that a plan to proceed as they have been.  He notes the oncologist advised of the neck issue was not related to his cancer.  Right neck pain: Patient notes the left-sided neck issue has resolved.  He notes the right neck is tender to palpation at times.  No swallowing issues.  No recent upper respiratory infections.   Social History   Tobacco Use  Smoking Status Former   Packs/day: 1.00   Years: 40.00   Pack years: 40.00   Types: Cigarettes   Quit date: 07/21/2006   Years since quitting: 15.4  Smokeless Tobacco Never    Current Outpatient Medications on File Prior to Visit  Medication Sig Dispense Refill   acetaminophen (TYLENOL) 325 MG tablet Take 1-2 tablets (325-650 mg total) by mouth every 4 (four) hours as needed for mild pain.     acyclovir (ZOVIRAX) 400 MG tablet TAKE 1 TABLET(400 MG) BY MOUTH TWICE DAILY 60 tablet 6   albuterol (VENTOLIN HFA) 108 (90 Base) MCG/ACT inhaler Inhale 2 puffs into the lungs every 6 (six) hours as needed for wheezing or shortness of breath. 1 each 0   Alirocumab (PRALUENT) 150 MG/ML SOAJ Inject 150 mg into the skin every 14 (fourteen) days. 2 mL 12   amiodarone (PACERONE) 200 MG tablet Take 1/2 tablet (100  mg) by mouth twice daily 5 days a week 60 tablet 6   apixaban (ELIQUIS) 5 MG TABS tablet Take 5 mg by mouth 2 (two) times daily.     azelastine (ASTELIN) 0.1 % nasal spray Place 2 sprays into both nostrils 2 (two) times daily. Use in each nostril as directed 30 mL 1   budesonide-formoterol (SYMBICORT) 160-4.5 MCG/ACT inhaler Inhale 2 puffs into the lungs 2 (two) times daily. 1 Inhaler 11   cetirizine (ZYRTEC) 10 MG tablet Take 10 mg by mouth daily as needed for allergies.      Coenzyme Q10 (COQ10) 200 MG CAPS Take 200 mg by mouth daily.     ezetimibe (ZETIA) 10 MG tablet Take 1 tablet (10 mg total) by mouth daily. 90 tablet 3   furosemide (LASIX) 40 MG tablet Take 1 tablet (40 mg total) by mouth daily. 30 tablet 0   guaiFENesin-dextromethorphan (ROBITUSSIN DM) 100-10 MG/5ML syrup Take 5 mLs by mouth every 4 (four) hours as needed for cough. 118 mL 0   Krill Oil 350 MG CAPS Take 350 mg by mouth daily as needed (Heart).     mirtazapine (REMERON) 15 MG tablet Take 15 mg by mouth at bedtime as needed (for panic associated with PTSD).      Multiple Vitamin (MULTIVITAMIN WITH MINERALS) TABS tablet Take 1 tablet by mouth  daily.     nitroGLYCERIN (NITROSTAT) 0.4 MG SL tablet Place 1 tablet (0.4 mg total) under the tongue every 5 (five) minutes as needed for chest pain. 25 tablet 6   Tiotropium Bromide Monohydrate 2.5 MCG/ACT AERS Inhale 2 puffs into the lungs at bedtime. Spiriva     traMADol (ULTRAM) 50 MG tablet Take 1 tablet (50 mg total) by mouth every 12 (twelve) hours as needed. 14 tablet 0   venetoclax (VENCLEXTA) 100 MG tablet Take 100 mg by mouth daily. Tablets should be swallowed whole with a meal and a full glass of water.     No current facility-administered medications on file prior to visit.     ROS see history of present illness  Objective  Physical Exam Vitals:   12/23/21 1021  BP: 118/70  Pulse: 84  Temp: 98.1 F (36.7 C)  SpO2: 94%    BP Readings from Last 3 Encounters:   12/23/21 118/70  12/19/21 101/61  12/10/21 110/60   Wt Readings from Last 3 Encounters:  12/23/21 203 lb 6.4 oz (92.3 kg)  12/19/21 204 lb 12.8 oz (92.9 kg)  12/10/21 201 lb 3.2 oz (91.3 kg)    Physical Exam Constitutional:      General: He is not in acute distress.    Appearance: He is not diaphoretic.  Neck:   Cardiovascular:     Rate and Rhythm: Normal rate and regular rhythm.     Heart sounds: Normal heart sounds.  Pulmonary:     Effort: Pulmonary effort is normal.     Breath sounds: Normal breath sounds.  Skin:    General: Skin is warm and dry.  Neurological:     Mental Status: He is alert.     Assessment/Plan: Please see individual problem list.  Problem List Items Addressed This Visit     CLL (chronic lymphocytic leukemia) (HCC) (Chronic)    Increased adenopathy on recent imaging.  He will continue management through oncology.       Left-sided weakness    Progressively improving.  We will check into physical therapy issue.  Discussed that his neurosurgeon will determine when he will be released to do normal activities.  He will keep his follow-up visit with the neurosurgeon.       Neck nodule    Patient notes this has resolved.  He will monitor for recurrence.       Neck pain    Right-sided muscular neck pain based on exam.  Discussed he is tender over a muscle in his neck.  Discussed possible strain.  Advised given that this is persisting we could do an ultrasound to rule out other underlying causes though the patient defers this.  If it does not resolve in the next several weeks he will let us know.         Return in about 3 months (around 03/25/2022) for Left-sided weakness.   I have spent 32 minutes in the care of this patient regarding history taking, documentation, completion of exam, discussion of plan.   [name], MD [place]

## 2021-12-23 NOTE — Assessment & Plan Note (Signed)
Increased adenopathy on recent imaging.  He will continue management through oncology.

## 2021-12-23 NOTE — Assessment & Plan Note (Signed)
Patient notes this has resolved.  He will monitor for recurrence.

## 2021-12-23 NOTE — Assessment & Plan Note (Signed)
Progressively improving.  We will check into physical therapy issue.  Discussed that his neurosurgeon will determine when he will be released to do normal activities.  He will keep his follow-up visit with the neurosurgeon.

## 2021-12-23 NOTE — Assessment & Plan Note (Signed)
Right-sided muscular neck pain based on exam.  Discussed he is tender over a muscle in his neck.  Discussed possible strain.  Advised given that this is persisting we could do an ultrasound to rule out other underlying causes though the patient defers this.  If it does not resolve in the next several weeks he will let us know.

## 2021-12-26 ENCOUNTER — Encounter: Payer: Medicare HMO | Attending: Physical Medicine & Rehabilitation | Admitting: Physical Medicine & Rehabilitation

## 2021-12-26 VITALS — BP 100/67 | HR 86 | Ht 69.0 in | Wt 201.4 lb

## 2021-12-26 DIAGNOSIS — I10 Essential (primary) hypertension: Secondary | ICD-10-CM

## 2021-12-26 DIAGNOSIS — M4712 Other spondylosis with myelopathy, cervical region: Secondary | ICD-10-CM

## 2021-12-26 DIAGNOSIS — U071 COVID-19: Secondary | ICD-10-CM

## 2021-12-26 NOTE — Progress Notes (Unsigned)
Subjective:    Patient ID: DEVRIN Doyle, male    DOB: 13-Mar-1946, 76 y.o.   MRN: [id number]  HPI Hospital HPI  Brief HPI:   [name] is a 76 y.o. male presented to [facility] on 10/18/2021 for left-sided weakness.  He was outside the window for thrombolytics.  EKG was unremarkable.  Patient with history of atrial fibrillation maintained on Eliquis.  CTA negative for stroke.  Unable to perform MRI due to patient's pacemaker.  CTA of head and neck performed without evidence of LVO.  Neurology consulted and patient likely had suffered ischemic stroke.  Eliquis held and aspirin continued.  Eliquis was started 5 days after initial presentation.       Hospital Course: [name] was admitted to rehab 10/23/2021 for inpatient therapies to consist of PT, ST and OT at least three hours five days a week. Past admission physiatrist, therapy team and rehab RN have worked together to provide customized collaborative inpatient rehab.  4/6, the patient was evaluated by Dr. Naaman Plummer who noted sustained clonus at the ankles and increased DTRs.  The patient had undergone cervical spine CT and this was reviewed.  Cervical myelopathy was suspected and neurosurgery consultation was obtained with Dr. Arnoldo Morale.  Patient's cardiologist Dr. Rockey Situ was contacted regarding MRI compatibility of the patient's Medtronic pacemaker.  He was premedicated with oral Valium and IV Ativan due to anxiety and history of PTSD and underwent MRI of the brain and cervical spine on 4/10.  Brain MRI stable.  C-spine MRI shows multilevel spinal stenosis. He was seen in follow-up by Dr. Arnoldo Morale on 4/13.  He noted the narrowing in the neck but nothing urgent.  He will follow-up with him in 1 to 2 months.  Blood pressure, heart failure remained stable.  Stable for discharge home on 4/14.   Blood pressures were monitored on TID basis and remained controlled on Imdur     Rehab course: During patient's stay in rehab weekly  team conferences were held to monitor patient's progress, set goals and discuss barriers to discharge. At admission, patient required minimal assistance with ADLs and contact-guard assist minimal assist with transfers and locomotion.   He has had improvement in activity tolerance, balance, postural control as well as ability to compensate for deficits. He has had improvement in functional use RUE/LUE  and RLE/LLE as well as improvement in awareness   Interval history 76 year old male who is here for follow-up after his stay at [facility]. he reports he is doing well at home and that his strength has dramatically improved.  He has followed up with his primary care physician.  He has all his necessary medications and equipment.  Patient feels like his strength and speech are back to their baseline.  Home PT and OT stopped coming to his house abruptly after 2 sessions reportedly due to an insurance issue. He is following with his multiple outside specialist such as oncology neurosurgery cardiology.  He is here with his wife.  He would like to start driving again.  Patient and wife deny that he has had any confusion and report his mental status is back to his baseline.   Pain Inventory Average Pain 8 Pain Right Now 8 My pain is aching  LOCATION OF PAIN  knee, buttocks  BOWEL Number of stools per week: 7 Oral laxative use No  Type of laxative . Enema or suppository use No  History of colostomy No  Incontinent No   BLADDER  Normal In and out cath, frequency . Able to self cath  . Bladder incontinence Yes  Frequent urination Yes  Leakage with coughing No  Difficulty starting stream No  Incomplete bladder emptying No    Mobility use a cane use a walker how many minutes can you walk? 10 ability to climb steps?  yes  Function retired I need assistance with the following:  meal prep and shopping  Neuro/Psych trouble walking  Prior Studies Any changes since last visit?  no  Physicians  involved in your care Any changes since last visit?  no   Family History  Problem Relation Age of Onset   Heart disease Mother    Heart attack Mother    Coronary artery disease Other        family history   Social History   Socioeconomic History   Marital status: Married    Spouse name: Not on file   Number of children: Not on file   Years of education: Not on file   Highest education level: Not on file  Occupational History   Occupation: retired    Fish farm manager: [employer]    Comment: Education officer, [employer]  Tobacco Use   Smoking status: Former    Packs/day: 1.00    Years: 40.00    Total pack years: 40.00    Types: Cigarettes    Quit date: 07/21/2006    Years since quitting: 15.4   Smokeless tobacco: Never  Vaping Use   Vaping Use: Former  Substance and Sexual Activity   Alcohol use: Not Currently   Drug use: No   Sexual activity: Not Currently  Other Topics Concern   Not on file  Social History Narrative   Lives in [place] with wife. Dogs. Goats. Children - 4. Grandchildren - 7.      Worked - Sports coach, part time.   Diet - healthy   Exercise - very active      Service - [employer] - [place]   Social Determinants of Health   Financial Resource Strain: Low Risk  (08/16/2021)   Overall Financial Resource Strain (CARDIA)    Difficulty of Paying Living Expenses: Not hard at all  Food Insecurity: No Food Insecurity (08/16/2021)   Hunger Vital Sign    Worried About Running Out of Food in the Last Year: Never true    Ran Out of Food in the Last Year: Never true  Transportation Needs: No Transportation Needs (08/16/2021)   PRAPARE - Hydrologist (Medical): No    Lack of Transportation (Non-Medical): No  Physical Activity: Sufficiently Active (08/16/2021)   Exercise Vital Sign    Days of Exercise per Week: 7 days    Minutes of Exercise per Session: 30 min  Stress: No Stress Concern Present (08/16/2021)   [place]    Feeling of Stress : Not at all  Social Connections: Unknown (08/16/2021)   Social Connection and Isolation Panel [NHANES]    Frequency of Communication with Friends and Family: More than three times a week    Frequency of Social Gatherings with Friends and Family: Not on file    Attends Religious Services: Not on file    Active Member of Clubs or Organizations: Not on file    Attends Archivist Meetings: Not on file    Marital Status: Married   Past Surgical History:  Procedure Laterality Date   ABDOMINAL HERNIA REPAIR  APPENDECTOMY  06/21/1985   CARDIAC CATHETERIZATION  2010; 2011   ; Dr Fletcher Anon   CORONARY ARTERY BYPASS GRAFT  04/2009   "CABG X3"   EP IMPLANTABLE DEVICE N/A 03/03/2016   Procedure: Pacemaker Implant;  Surgeon: Deboraha Sprang, MD;  Location: [facility];  Service: Cardiovascular;  Laterality: N/A;   FOREIGN BODY REMOVAL  1968   "shrapnel in my tailbone"   INGUINAL HERNIA REPAIR Right    INSERT / REPLACE / [place] Right 2018   LAPAROSCOPIC CHOLECYSTECTOMY     RIGHT/LEFT HEART CATH AND CORONARY/GRAFT ANGIOGRAPHY N/A 02/04/2021   Procedure: RIGHT/LEFT HEART CATH AND CORONARY/GRAFT ANGIOGRAPHY;  Surgeon: Nelva Bush, MD;  Location: [facility];  Service: Cardiovascular;  Laterality: N/A;   TONSILLECTOMY AND ADENOIDECTOMY  1956   TOTAL HIP ARTHROPLASTY Right 10/22/2016   Procedure: TOTAL HIP ARTHROPLASTY;  Surgeon: Dereck Leep, MD;  Location: [facility];  Service: Orthopedics;  Laterality: Right;   TOTAL HIP ARTHROPLASTY Left 11/04/2017   Procedure: TOTAL HIP ARTHROPLASTY;  Surgeon: Dereck Leep, MD;  Location: [facility];  Service: Orthopedics;  Laterality: Left;   Past Medical History:  Diagnosis Date   Anxiety    Arthritis    Atrial fibrillation ([place])    a. Dx 2013, recurred 02/2014, CHA2DS2VASc = 3 -->placed on Eliquis;  b. 02/2014 Echo: EF 50-55%,  mid and apical anterior septum and mid and apical inf septum are abnl, mild to mod Ao sclerosis w/o AS.   Cancer associated pain    Chicken pox    Chronic lymphocytic leukemia ([place])    a. Dx 02/2014.   CLL (chronic lymphocytic leukemia) (HCC)    Complication of anesthesia    History of  PTSD--do not touch patient when waking up from surgery.   COPD (chronic obstructive pulmonary disease) (HCC)    Coronary artery disease    a. 04/2009 CABG x 3 (LIMA->LAD, VG->OM1, VG->PDA);  b. 09/2009 Cath: occluded VG x 2 w/ patent LIMA and L->R collats. EF 55%, mild antlat HK;  c. 10/2011 MV: EF 53%, no isch/infarct-->low risk.   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   TIA (transient ischemic attack) 11/02/2015   BP 100/67   Pulse 86   Ht '5\' 9"'$  (1.753 m)   Wt 201 lb 6.4 oz (91.4 kg)   SpO2 93%   BMI 29.74 kg/m   Opioid Risk Score:   Fall Risk Score:  `1  Depression screen [facility] 2/9     12/26/2021   11:12 AM 11/14/2021   11:39 AM 11/11/2021   11:17 AM 10/18/2021   12:07 PM 08/16/2021   11:11 AM 12/25/2020   11:00 AM 05/14/2020   10:47 AM  Depression screen PHQ 2/9  Decreased Interest 0 0 0 0 0 0 0  Down, Depressed, Hopeless 0 0 0 0 0 0 0  PHQ - 2 Score 0 0 0 0 0 0 0  Altered sleeping  0       Tired, decreased energy  1       Change in appetite  0       Feeling bad or failure about yourself   0       Trouble concentrating  0       Moving slowly or fidgety/restless  1       Suicidal thoughts  0       PHQ-9 Score  2          Review of Systems   Respiratory:  Positive for cough and shortness of breath.   Musculoskeletal:  Positive for gait problem.  All other systems reviewed and are negative.     Objective:   Physical Exam  Today's Vitals   12/26/21 1112  BP: 100/67  Pulse: 86  SpO2: 93%  Weight: 91.4 kg  Height: '5\' 9"'$  (1.753 m)   Body mass index is 29.74 kg/m.   Gen: no distress, normal appearing HEENT: oral mucosa pink and moist, NCAT Cardio: Reg rate Chest: normal effort, normal rate of breathing Abd: soft, non-distended Ext: no edema Psych: very pleasant, normal affect Skin: intact Neuro: Alert and oriented x4 cranial nerves II through XII intact.  Sensation to light touch intact in bilateral upper extremity and lower extremity, coordination normal Musculoskeletal: 5 out of 5 in right upper extremity and right lower extremity, 5 out of 5 in left upper extremity, 5- out of 5 in proximal left lower extremity, 5 out of 5 in distal left lower extremity, no clonus, DTR slightly increased but symmetric in bilateral upper extremities and lower extremities range of motion, normal bulk and tone, no joint swelling or tenderness noted      Assessment & Plan:   Today's Vitals   12/26/21 1112  BP: 100/67  Pulse: 86  SpO2: 93%  Weight: 91.4 kg  Height: '5\' 9"'$  (1.753 m)   Body mass index is 29.74 kg/m.   Right sided ischemic stroke vs Cervical myelopathy  and debility from recent Covid 19 infection             -Office has called home health agency for restart PT OT, I do not suspect he will need an extended course of this  -Graduated return to driving instructions were provided. It is recommended that the patient first drives with another licensed driver in an empty parking lot. If the patient does well with this, and they can drive on a quiet street with the licensed driver. If the patient does well with this they can drive on a busy street with a licensed driver. If the patient does well with this, the next time out  they can go by himself. For the first month after resuming driving, I recommend no nighttime or Interstate driving.    -He is noted to have multilevel spinal and foraminal stenosis on his cervical MRI, he has follow-up with neurosurgery scheduled.  Brain MRI was unremarkable, it appears his motor function is back to around his baseline.  Clonus no longer noted.  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  2. Hypertension: Well-controlled today continue to follow-up with PCP  3. Chronic diastolic HF, CAD, atrial fibrillation Continue follow-up with cardiology  4. Hyperlipidemia: continue Zetia

## 2021-12-28 ENCOUNTER — Encounter: Payer: Self-pay | Admitting: Physical Medicine & Rehabilitation

## 2022-01-03 ENCOUNTER — Telehealth: Payer: Self-pay | Admitting: Internal Medicine

## 2022-01-03 ENCOUNTER — Ambulatory Visit (INDEPENDENT_AMBULATORY_CARE_PROVIDER_SITE_OTHER): Payer: Medicare HMO | Admitting: Internal Medicine

## 2022-01-03 ENCOUNTER — Encounter: Payer: Self-pay | Admitting: Internal Medicine

## 2022-01-03 VITALS — BP 110/80 | HR 71 | Temp 97.6°F | Resp 14 | Ht 69.0 in | Wt 201.8 lb

## 2022-01-03 DIAGNOSIS — M542 Cervicalgia: Secondary | ICD-10-CM | POA: Diagnosis not present

## 2022-01-03 DIAGNOSIS — R591 Generalized enlarged lymph nodes: Secondary | ICD-10-CM | POA: Diagnosis not present

## 2022-01-03 DIAGNOSIS — C9110 Chronic lymphocytic leukemia of B-cell type not having achieved remission: Secondary | ICD-10-CM

## 2022-01-03 DIAGNOSIS — R1310 Dysphagia, unspecified: Secondary | ICD-10-CM | POA: Insufficient documentation

## 2022-01-03 DIAGNOSIS — J029 Acute pharyngitis, unspecified: Secondary | ICD-10-CM | POA: Diagnosis not present

## 2022-01-03 DIAGNOSIS — R1312 Dysphagia, oropharyngeal phase: Secondary | ICD-10-CM | POA: Insufficient documentation

## 2022-01-03 DIAGNOSIS — G8929 Other chronic pain: Secondary | ICD-10-CM | POA: Insufficient documentation

## 2022-01-03 LAB — POCT RAPID STREP A (OFFICE): Rapid Strep A Screen: NEGATIVE

## 2022-01-03 MED ORDER — AMOXICILLIN-POT CLAVULANATE 875-125 MG PO TABS: 1.0000 | ORAL_TABLET | Freq: Two times a day (BID) | ORAL | 0 refills | Status: DC

## 2022-01-03 MED ORDER — TRAMADOL HCL 50 MG PO TABS: 50.0000 mg | ORAL_TABLET | Freq: Two times a day (BID) | ORAL | 0 refills | Status: DC | PRN

## 2022-01-03 NOTE — Telephone Encounter (Signed)
Right neck swollen glands increased in 6 weeks ordered repeat ct neck?  Will you order CT chest/ab/pelvis and PET scan in upcoming months    Neck glands enlarging causing trouble swallowing based on CT neck results what do you think ? Im thinking ENT for bx and GI for EGD possibly?   I am worried about about CLL not being in remission?

## 2022-01-03 NOTE — Progress Notes (Signed)
Chief Complaint  Patient presents with   Neck Pain    Pt c/o neck swelling since seeing Dr.Sonnenberg on 6/5, now having issues with swallowing. Tramadol only helps with sleep, interested in neck inj locally. Indecisive on PT    F/u  1. Neck pain and lymphadenopathy right neck swollen glands larger over the last 6 weeks 9/10 pain and imaging they were larger than prior imaging. Enlarging to the point at times hard to swallow   CT neck 12/12/21  IMPRESSION: Scattered lymph nodes in the neck and retroclavicular regions which have enlarged from 10/18/2021 and measure up to 1 cm in short axis.     Electronically Signed   By: Logan Bores M.D.   On: 12/12/2021 17:07        Electronically Signed   By: Jorje Guild M.D.   On: 10/24/2021 06:28   2. Chronic neck pain tramadol 50 mg bid helps with neck pain and able to sleep and he would be agreeable to injections locally not PT for now 9/10 pain  See ct neck 10/24/21   Ct cervical  10/24/21  IMPRESSION: 1. 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 2. Multilevel foraminal impingement which is most advanced bilaterally at C3-4, bilaterally at C4-5, and on the right at C6-7. 3. 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  3. CLL off Venclexta x 3 months due to 08/16/21 to 09/2021 had copd hosp then covid hosp then stroke   Review of Systems  HENT:  Positive for sore throat.   Gastrointestinal:        +dysphagia   Musculoskeletal:  Positive for neck pain.   Past Medical History:  Diagnosis Date   Anxiety    Arthritis    Atrial fibrillation ([place])    a. Dx 2013, recurred 02/2014, CHA2DS2VASc = 3 -->placed on Eliquis;  b. 02/2014 Echo: EF 50-55%, mid and apical anterior septum and mid and apical inf septum are abnl, mild to mod Ao sclerosis w/o AS.   Cancer associated pain    Chicken pox    Chronic lymphocytic leukemia ([place])    a. Dx 02/2014.   CLL (chronic lymphocytic leukemia) (HCC)     Complication of anesthesia    History of  PTSD--do not touch patient when waking up from surgery.   COPD (chronic obstructive pulmonary disease) (HCC)    Coronary artery disease    a. 04/2009 CABG x 3 (LIMA->LAD, VG->OM1, VG->PDA);  b. 09/2009 Cath: occluded VG x 2 w/ patent LIMA and L->R collats. EF 55%, mild antlat HK;  c. 10/2011 MV: EF 53%, no isch/infarct-->low risk.   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   TIA (transient ischemic attack) 11/02/2015   Past Surgical History:  Procedure Laterality Date   ABDOMINAL HERNIA REPAIR     APPENDECTOMY  06/21/1985   CARDIAC CATHETERIZATION  2010; 2011   ; Dr Fletcher Anon   CORONARY ARTERY BYPASS GRAFT  04/2009   "CABG  X3"   EP IMPLANTABLE DEVICE N/A 03/03/2016   Procedure: Pacemaker Implant;  Surgeon: Deboraha Sprang, MD;  Location: [facility];  Service: Cardiovascular;  Laterality: N/A;   FOREIGN BODY REMOVAL  1968   "shrapnel in my tailbone"   INGUINAL HERNIA REPAIR Right    INSERT / REPLACE / [place] Right 2018   LAPAROSCOPIC CHOLECYSTECTOMY     RIGHT/LEFT HEART CATH AND CORONARY/GRAFT ANGIOGRAPHY N/A 02/04/2021   Procedure: RIGHT/LEFT HEART CATH AND CORONARY/GRAFT ANGIOGRAPHY;  Surgeon: Nelva Bush, MD;  Location: [facility];  Service: Cardiovascular;  Laterality: N/A;   TONSILLECTOMY AND ADENOIDECTOMY  1956   TOTAL HIP ARTHROPLASTY Right 10/22/2016   Procedure: TOTAL HIP ARTHROPLASTY;  Surgeon: Dereck Leep, MD;  Location: [facility];  Service:  Orthopedics;  Laterality: Right;   TOTAL HIP ARTHROPLASTY Left 11/04/2017   Procedure: TOTAL HIP ARTHROPLASTY;  Surgeon: Dereck Leep, MD;  Location: [facility];  Service: Orthopedics;  Laterality: Left;   Family History  Problem Relation Age of Onset   Heart disease Mother    Heart attack Mother    Coronary artery disease Other        family history   Social History   Socioeconomic History   Marital status: Married    Spouse name: Not on file   Number of children: Not on file   Years of education: Not on file   Highest education level: Not on file  Occupational History   Occupation: retired    Fish farm manager: [employer]    Comment: Education officer, [employer]  Tobacco Use   Smoking status: Former    Packs/day: 1.00    Years: 40.00    Total pack years: 40.00    Types: Cigarettes    Quit date: 07/21/2006    Years since quitting: 15.4   Smokeless tobacco: Never  Vaping Use   Vaping Use: Former  Substance and Sexual Activity   Alcohol use: Not Currently   Drug use: No   Sexual activity: Not Currently  Other Topics Concern   Not on file  Social History Narrative   Lives in [place] with wife. Dogs. Goats. Children - 4. Grandchildren - 7.      Worked - Sports coach, part time.   Diet - healthy   Exercise - very active      Service - [employer] - [place]   Social Determinants of Health   Financial Resource Strain: Low Risk  (08/16/2021)   Overall Financial Resource Strain (CARDIA)    Difficulty of Paying Living Expenses: Not hard at all  Food Insecurity: No Food Insecurity (08/16/2021)   Hunger Vital Sign    Worried About Running Out of Food in the Last Year: Never true    Ran Out of Food in the Last Year: Never true  Transportation Needs: No Transportation Needs (08/16/2021)   PRAPARE - Hydrologist (Medical): No    Lack of Transportation (Non-Medical): No  Physical Activity: Sufficiently Active (08/16/2021)   Exercise Vital Sign    Days  of Exercise per Week: 7 days    Minutes of Exercise per Session: 30 min  Stress: No Stress Concern Present (08/16/2021)   [place]    Feeling of Stress : Not at all  Social Connections: Unknown (08/16/2021)   Social Connection and Isolation Panel [NHANES]    Frequency of Communication with Friends and Family:  More than three times a week    Frequency of Social Gatherings with Friends and Family: Not on file    Attends Religious Services: Not on file    Active Member of Clubs or Organizations: Not on file    Attends Club or Organization Meetings: Not on file    Marital Status: Married  Intimate Partner Violence: Not At Risk (08/16/2021)   Humiliation, Afraid, Rape, and Kick questionnaire    Fear of Current or Ex-Partner: No    Emotionally Abused: No    Physically Abused: No    Sexually Abused: No   Current Meds  Medication Sig   acetaminophen (TYLENOL) 325 MG tablet Take 1-2 tablets (325-650 mg total) by mouth every 4 (four) hours as needed for mild pain.   acyclovir (ZOVIRAX) 400 MG tablet TAKE 1 TABLET(400 MG) BY MOUTH TWICE DAILY   albuterol (VENTOLIN HFA) 108 (90 Base) MCG/ACT inhaler Inhale 2 puffs into the lungs every 6 (six) hours as needed for wheezing or shortness of breath.   Alirocumab (PRALUENT) 150 MG/ML SOAJ Inject 150 mg into the skin every 14 (fourteen) days.   amiodarone (PACERONE) 200 MG tablet Take 1/2 tablet (100 mg) by mouth twice daily 5 days a week   amoxicillin-clavulanate (AUGMENTIN) 875-125 MG tablet Take 1 tablet by mouth 2 (two) times daily. With food   apixaban (ELIQUIS) 5 MG TABS tablet Take 5 mg by mouth 2 (two) times daily.   azelastine (ASTELIN) 0.1 % nasal spray Place 2 sprays into both nostrils 2 (two) times daily. Use in each nostril as directed   budesonide-formoterol (SYMBICORT) 160-4.5 MCG/ACT inhaler Inhale 2 puffs into the lungs 2 (two) times daily.   cetirizine (ZYRTEC) 10 MG  tablet Take 10 mg by mouth daily as needed for allergies.    Coenzyme Q10 (COQ10) 200 MG CAPS Take 200 mg by mouth daily.   ezetimibe (ZETIA) 10 MG tablet Take 1 tablet (10 mg total) by mouth daily.   furosemide (LASIX) 40 MG tablet Take 1 tablet (40 mg total) by mouth daily.   guaiFENesin-dextromethorphan (ROBITUSSIN DM) 100-10 MG/5ML syrup Take 5 mLs by mouth every 4 (four) hours as needed for cough.   Krill Oil 350 MG CAPS Take 350 mg by mouth daily as needed (Heart).   mirtazapine (REMERON) 15 MG tablet Take 15 mg by mouth at bedtime as needed (for panic associated with PTSD).    Multiple Vitamin (MULTIVITAMIN WITH MINERALS) TABS tablet Take 1 tablet by mouth daily.   nitroGLYCERIN (NITROSTAT) 0.4 MG SL tablet Place 1 tablet (0.4 mg total) under the tongue every 5 (five) minutes as needed for chest pain.   Tiotropium Bromide Monohydrate 2.5 MCG/ACT AERS Inhale 2 puffs into the lungs at bedtime. Spiriva   venetoclax (VENCLEXTA) 100 MG tablet Take 100 mg by mouth daily. Tablets should be swallowed whole with a meal and a full glass of water.   [DISCONTINUED] traMADol (ULTRAM) 50 MG tablet Take 1 tablet (50 mg total) by mouth every 12 (twelve) hours as needed.   No Known Allergies Recent Results (from the past 2160 hour(s))  Protime-INR     Status: Abnormal   Collection Time: 10/18/21  1:15 PM  Result Value Ref Range   Prothrombin Time 18.6 (H) 11.4 - 15.2 seconds   INR 1.6 (H) 0.8 - 1.2    Comment: (NOTE) INR goal varies based on device and disease states. Performed at [facility], [address] [place], [place] [address]   APTT  Status: Abnormal   Collection Time: 10/18/21  1:15 PM  Result Value Ref Range   aPTT 40 (H) 24 - 36 seconds    Comment:        IF BASELINE aPTT IS ELEVATED, SUGGEST PATIENT RISK ASSESSMENT BE USED TO DETERMINE APPROPRIATE ANTICOAGULANT THERAPY. Performed at [facility], [address] [place], [place] [address]   CBC      Status: Abnormal   Collection Time: 10/18/21  1:15 PM  Result Value Ref Range   WBC 9.5 4.0 - 10.5 K/uL   RBC 3.17 (L) 4.22 - 5.81 MIL/uL   Hemoglobin 10.5 (L) 13.0 - 17.0 g/dL   HCT 33.3 (L) 39.0 - 52.0 %   MCV 105.0 (H) 80.0 - 100.0 fL   MCH 33.1 26.0 - 34.0 pg   MCHC 31.5 30.0 - 36.0 g/dL   RDW 16.9 (H) 11.5 - 15.5 %   Platelets 264 150 - 400 K/uL   nRBC 0.0 0.0 - 0.2 %    Comment: Performed at [facility], [address] [place], [place] [address]  Differential     Status: Abnormal   Collection Time: 10/18/21  1:15 PM  Result Value Ref Range   Neutrophils Relative % 66 %   Neutro Abs 6.4 1.7 - 7.7 K/uL   Lymphocytes Relative 20 %   Lymphs Abs 1.9 0.7 - 4.0 K/uL   Monocytes Relative 11 %   Monocytes Absolute 1.0 0.1 - 1.0 K/uL   Eosinophils Relative 1 %   Eosinophils Absolute 0.1 0.0 - 0.5 K/uL   Basophils Relative 0 %   Basophils Absolute 0.0 0.0 - 0.1 K/uL   Immature Granulocytes 2 %   Abs Immature Granulocytes 0.16 (H) 0.00 - 0.07 K/uL    Comment: Performed at [facility], [address] [place], [place] [address]  Comprehensive metabolic panel     Status: Abnormal   Collection Time: 10/18/21  1:15 PM  Result Value Ref Range   Sodium 137 135 - 145 mmol/L   Potassium 3.4 (L) 3.5 - 5.1 mmol/L   Chloride 99 98 - 111 mmol/L   CO2 29 22 - 32 mmol/L   Glucose, Bld 120 (H) 70 - 99 mg/dL    Comment: Glucose reference range applies only to samples taken after fasting for at least 8 hours.   BUN 16 8 - 23 mg/dL   Creatinine, Ser 1.10 0.61 - 1.24 mg/dL   Calcium 8.3 (L) 8.9 - 10.3 mg/dL   Total Protein 6.8 6.5 - 8.1 g/dL   Albumin 3.4 (L) 3.5 - 5.0 g/dL   AST 28 15 - 41 U/L   ALT 38 0 - 44 U/L   Alkaline Phosphatase 114 38 - 126 U/L   Total Bilirubin 1.1 0.3 - 1.2 mg/dL   GFR, Estimated >60 >60 mL/min    Comment: (NOTE) Calculated using the CKD-EPI Creatinine Equation (2021)    Anion gap 9 5 - 15    Comment: Performed at [facility],  [address] [place], [place] [address]  Hemoglobin A1c     Status: Abnormal   Collection Time: 10/19/21  5:32 AM  Result Value Ref Range   Hgb A1c MFr Bld 5.9 (H) 4.8 - 5.6 %    Comment: (NOTE) Pre diabetes:          5.7%-6.4%  Diabetes:              >6.4%  Glycemic control for   <7.0% adults  with diabetes    Mean Plasma Glucose 122.63 mg/dL    Comment: Performed at [facility] [address], [place], [place] [address]  Lipid panel     Status: Abnormal   Collection Time: 10/19/21  5:32 AM  Result Value Ref Range   Cholesterol 79 0 - 200 mg/dL   Triglycerides 77 <150 mg/dL   HDL 37 (L) >40 mg/dL   Total CHOL/HDL Ratio 2.1 RATIO   VLDL 15 0 - 40 mg/dL   LDL Cholesterol 27 0 - 99 mg/dL    Comment:        Total Cholesterol/HDL:CHD Risk Coronary Heart Disease Risk Table                     Men   Women  1/2 Average Risk   3.4   3.3  Average Risk       5.0   4.4  2 X Average Risk   9.6   7.1  3 X Average Risk  23.4   11.0        Use the calculated Patient Ratio above and the CHD Risk Table to determine the patient's CHD Risk.        ATP III CLASSIFICATION (LDL):  <100     mg/dL   Optimal  100-129  mg/dL   Near or Above                    Optimal  130-159  mg/dL   Borderline  160-189  mg/dL   High  >190     mg/dL   Very High Performed at [facility], [place], [place] [address]   Troponin I (High Sensitivity)     Status: None   Collection Time: 10/21/21  9:35 AM  Result Value Ref Range   Troponin I (High Sensitivity) 8 <18 ng/L    Comment: (NOTE) Elevated high sensitivity troponin I (hsTnI) values and significant  changes across serial measurements may suggest ACS but many other  chronic and acute conditions are known to elevate hsTnI results.  Refer to the "Links" section for chest pain algorithms and additional  guidance. Performed at [facility], [place], [place] [address]   Troponin I (High  Sensitivity)     Status: None   Collection Time: 10/21/21 11:43 AM  Result Value Ref Range   Troponin I (High Sensitivity) 8 <18 ng/L    Comment: (NOTE) Elevated high sensitivity troponin I (hsTnI) values and significant  changes across serial measurements may suggest ACS but many other  chronic and acute conditions are known to elevate hsTnI results.  Refer to the "Links" section for chest pain algorithms and additional  guidance. Performed at [facility], [address] [place],  [address]   Comprehensive metabolic panel     Status: Abnormal   Collection Time: 10/24/21  5:12 AM  Result Value Ref Range   Sodium 138 135 - 145 mmol/L   Potassium 3.7 3.5 - 5.1 mmol/L   Chloride 104 98 - 111 mmol/L   CO2 26 22 - 32 mmol/L   Glucose, Bld 129 (H) 70 - 99 mg/dL    Comment: Glucose reference range applies only to samples taken after fasting for at least 8 hours.   BUN 11 8 - 23 mg/dL   Creatinine, Ser 0.81 0.61 - 1.24 mg/dL   Calcium 8.3 (L) 8.9 - 10.3 mg/dL   Total Protein 5.4 (  L) 6.5 - 8.1 g/dL   Albumin 2.5 (L) 3.5 - 5.0 g/dL   AST 27 15 - 41 U/L   ALT 36 0 - 44 U/L   Alkaline Phosphatase 103 38 - 126 U/L   Total Bilirubin 0.5 0.3 - 1.2 mg/dL   GFR, Estimated >60 >60 mL/min    Comment: (NOTE) Calculated using the CKD-EPI Creatinine Equation (2021)    Anion gap 8 5 - 15    Comment: Performed at [facility] [address], [place], [place] [address]  CBC WITH DIFFERENTIAL     Status: Abnormal   Collection Time: 10/24/21  5:12 AM  Result Value Ref Range   WBC 7.7 4.0 - 10.5 K/uL   RBC 2.89 (L) 4.22 - 5.81 MIL/uL   Hemoglobin 9.8 (L) 13.0 - 17.0 g/dL   HCT 30.1 (L) 39.0 - 52.0 %   MCV 104.2 (H) 80.0 - 100.0 fL   MCH 33.9 26.0 - 34.0 pg   MCHC 32.6 30.0 - 36.0 g/dL   RDW 17.2 (H) 11.5 - 15.5 %   Platelets 207 150 - 400 K/uL   nRBC 0.0 0.0 - 0.2 %   Neutrophils Relative % 60 %   Neutro Abs 4.7 1.7 - 7.7 K/uL   Lymphocytes Relative 26 %   Lymphs  Abs 2.0 0.7 - 4.0 K/uL   Monocytes Relative 10 %   Monocytes Absolute 0.8 0.1 - 1.0 K/uL   Eosinophils Relative 1 %   Eosinophils Absolute 0.1 0.0 - 0.5 K/uL   Basophils Relative 1 %   Basophils Absolute 0.0 0.0 - 0.1 K/uL   Immature Granulocytes 2 %   Abs Immature Granulocytes 0.15 (H) 0.00 - 0.07 K/uL    Comment: Performed at [facility], [address], [place], [place] [address]  Basic metabolic panel     Status: Abnormal   Collection Time: 10/28/21  7:37 AM  Result Value Ref Range   Sodium 136 135 - 145 mmol/L   Potassium 3.9 3.5 - 5.1 mmol/L   Chloride 101 98 - 111 mmol/L   CO2 27 22 - 32 mmol/L   Glucose, Bld 205 (H) 70 - 99 mg/dL    Comment: Glucose reference range applies only to samples taken after fasting for at least 8 hours.   BUN 15 8 - 23 mg/dL   Creatinine, Ser 0.94 0.61 - 1.24 mg/dL   Calcium 8.4 (L) 8.9 - 10.3 mg/dL   GFR, Estimated >60 >60 mL/min    Comment: (NOTE) Calculated using the CKD-EPI Creatinine Equation (2021)    Anion gap 8 5 - 15    Comment: Performed at [facility] [address] [place], [place] [address]  CBC     Status: Abnormal   Collection Time: 10/28/21  7:37 AM  Result Value Ref Range   WBC 8.6 4.0 - 10.5 K/uL   RBC 2.93 (L) 4.22 - 5.81 MIL/uL   Hemoglobin 9.6 (L) 13.0 - 17.0 g/dL   HCT 30.8 (L) 39.0 - 52.0 %   MCV 105.1 (H) 80.0 - 100.0 fL   MCH 32.8 26.0 - 34.0 pg   MCHC 31.2 30.0 - 36.0 g/dL   RDW 17.1 (H) 11.5 - 15.5 %   Platelets 229 150 - 400 K/uL   nRBC 0.0 0.0 - 0.2 %    Comment: Performed at [facility], [place] [address], [place], [place] [address]  CUP PACEART REMOTE DEVICE CHECK  Status: None   Collection Time: 10/28/21  2:25 PM  Result Value Ref Range   Date Time Interrogation Session [id number]    Pulse Generator Manufacturer MERM    Pulse Gen Model [id number] Advisa DR MRI    Pulse Gen Serial Number [id number]    Clinic Name [facility] Pulse Generator Type Implantable  Pulse Generator    Implantable Pulse Generator Implant Date [id number]    Implantable Lead Manufacturer MERM    Implantable Lead Model 5076 CapSureFix Novus MRI SureScan    Implantable Lead Serial Number [id number]    Implantable Lead Implant Date 44034742    Implantable Lead Location Detail 1 UNKNOWN    Implantable Lead Location [id number]    Implantable Lead Manufacturer MERM    Implantable Lead Model 5076 CapSureFix Novus MRI SureScan    Implantable Lead Serial Number [id number]    Implantable Lead Implant Date 33295188    Implantable Lead Location Detail 1 UNKNOWN    Implantable Lead Location [id number]    Lead Channel Setting Sensing Sensitivity 0.9 mV   Lead Channel Setting Pacing Amplitude 2 V   Lead Channel Setting Pacing Pulse Width 0.4 ms   Lead Channel Setting Pacing Amplitude 2.5 V   Lead Channel Impedance Value 437 ohm   Lead Channel Impedance Value 361 ohm   Lead Channel Sensing Intrinsic Amplitude 3.5 mV   Lead Channel Sensing Intrinsic Amplitude 3.5 mV   Lead Channel Pacing Threshold Amplitude 1 V   Lead Channel Pacing Threshold Pulse Width 0.4 ms   Lead Channel Impedance Value 665 ohm   Lead Channel Impedance Value 608 ohm   Lead Channel Sensing Intrinsic Amplitude 19.625 mV   Lead Channel Sensing Intrinsic Amplitude 19.625 mV   Lead Channel Pacing Threshold Amplitude 0.625 V   Lead Channel Pacing Threshold Pulse Width 0.4 ms   Battery Status OK    Battery Remaining Longevity 61 mo   Battery Voltage 2.99 V   Brady Statistic RA Percent Paced 53.39 %   Brady Statistic RV Percent Paced 0.21 %   Brady Statistic AP VP Percent 0.11 %   Brady Statistic AS VP Percent 0.09 %   Brady Statistic AP VS Percent 53.28 %   Brady Statistic AS VS Percent 46.52 %  Lactate dehydrogenase     Status: None   Collection Time: 11/18/21 12:41 PM  Result Value Ref Range   LDH 167 98 - 192 U/L    Comment: Performed at [facility], [address] [place], [place] [address]   Comprehensive metabolic panel     Status: Abnormal   Collection Time: 11/18/21 12:41 PM  Result Value Ref Range   Sodium 137 135 - 145 mmol/L   Potassium 3.7 3.5 - 5.1 mmol/L   Chloride 106 98 - 111 mmol/L   CO2 26 22 - 32 mmol/L   Glucose, Bld 112 (H) 70 - 99 mg/dL    Comment: Glucose reference range applies only to samples taken after fasting for at least 8 hours.   BUN 12 8 - 23 mg/dL   Creatinine, Ser 0.81 0.61 - 1.24 mg/dL   Calcium 8.5 (L) 8.9 - 10.3 mg/dL   Total Protein 6.7 6.5 - 8.1 g/dL   Albumin 3.5 3.5 - 5.0 g/dL   AST 28 15 - 41 U/L   ALT 30 0 - 44 U/L   Alkaline Phosphatase 110 38 - 126 U/L   Total Bilirubin 0.6 0.3 - 1.2 mg/dL   GFR,  Estimated >60 >60 mL/min    Comment: (NOTE) Calculated using the CKD-EPI Creatinine Equation (2021)    Anion gap 5 5 - 15    Comment: Performed at [facility], [address] [place], [place] [address]  CBC with Differential/Platelet     Status: Abnormal   Collection Time: 11/18/21 12:41 PM  Result Value Ref Range   WBC 11.4 (H) 4.0 - 10.5 K/uL   RBC 3.47 (L) 4.22 - 5.81 MIL/uL   Hemoglobin 11.2 (L) 13.0 - 17.0 g/dL   HCT 35.4 (L) 39.0 - 52.0 %   MCV 102.0 (H) 80.0 - 100.0 fL   MCH 32.3 26.0 - 34.0 pg   MCHC 31.6 30.0 - 36.0 g/dL   RDW 15.6 (H) 11.5 - 15.5 %   Platelets 202 150 - 400 K/uL   nRBC 0.0 0.0 - 0.2 %   Neutrophils Relative % 48 %   Neutro Abs 5.6 1.7 - 7.7 K/uL   Lymphocytes Relative 40 %   Lymphs Abs 4.6 (H) 0.7 - 4.0 K/uL   Monocytes Relative 9 %   Monocytes Absolute 1.0 0.1 - 1.0 K/uL   Eosinophils Relative 1 %   Eosinophils Absolute 0.1 0.0 - 0.5 K/uL   Basophils Relative 1 %   Basophils Absolute 0.1 0.0 - 0.1 K/uL   Immature Granulocytes 1 %   Abs Immature Granulocytes 0.07 0.00 - 0.07 K/uL    Comment: Performed at [facility], [address] [place], [place] [address]  Lactate dehydrogenase     Status: None   Collection Time: 12/19/21  9:16 AM  Result Value Ref Range   LDH 174 98 -  192 U/L    Comment: Performed at [facility], [address] [place], [place] [address]  Comprehensive metabolic panel     Status: Abnormal   Collection Time: 12/19/21  9:16 AM  Result Value Ref Range   Sodium 137 135 - 145 mmol/L   Potassium 3.6 3.5 - 5.1 mmol/L   Chloride 102 98 - 111 mmol/L   CO2 28 22 - 32 mmol/L   Glucose, Bld 132 (H) 70 - 99 mg/dL    Comment: Glucose reference range applies only to samples taken after fasting for at least 8 hours.   BUN 15 8 - 23 mg/dL   Creatinine, Ser 0.92 0.61 - 1.24 mg/dL   Calcium 8.5 (L) 8.9 - 10.3 mg/dL   Total Protein 7.0 6.5 - 8.1 g/dL   Albumin 3.1 (L) 3.5 - 5.0 g/dL   AST 30 15 - 41 U/L   ALT 26 0 - 44 U/L   Alkaline Phosphatase 119 38 - 126 U/L   Total Bilirubin 0.5 0.3 - 1.2 mg/dL   GFR, Estimated >60 >60 mL/min    Comment: (NOTE) Calculated using the CKD-EPI Creatinine Equation (2021)    Anion gap 7 5 - 15    Comment: Performed at [facility], [address] [place], [place] [address]  CBC with Differential/Platelet     Status: Abnormal   Collection Time: 12/19/21  9:16 AM  Result Value Ref Range   WBC 9.3 4.0 - 10.5 K/uL   RBC 3.55 (L) 4.22 - 5.81 MIL/uL   Hemoglobin 10.9 (L) 13.0 - 17.0 g/dL   HCT 35.4 (L) 39.0 - 52.0 %   MCV 99.7 80.0 - 100.0 fL   MCH 30.7 26.0 - 34.0 pg   MCHC 30.8 30.0 - 36.0 g/dL   RDW 15.5 11.5 - 15.5 %  Platelets 261 150 - 400 K/uL   nRBC 0.0 0.0 - 0.2 %   Neutrophils Relative % 48 %   Neutro Abs 4.5 1.7 - 7.7 K/uL   Lymphocytes Relative 42 %   Lymphs Abs 3.9 0.7 - 4.0 K/uL   Monocytes Relative 9 %   Monocytes Absolute 0.8 0.1 - 1.0 K/uL   Eosinophils Relative 0 %   Eosinophils Absolute 0.0 0.0 - 0.5 K/uL   Basophils Relative 1 %   Basophils Absolute 0.1 0.0 - 0.1 K/uL   Immature Granulocytes 0 %   Abs Immature Granulocytes 0.04 0.00 - 0.07 K/uL    Comment: Performed at [facility], [address] [place], [place] [address]   Objective  Body mass index is  29.8 kg/m. Wt Readings from Last 3 Encounters:  01/03/22 201 lb 12.8 oz (91.5 kg)  12/26/21 201 lb 6.4 oz (91.4 kg)  12/23/21 203 lb 6.4 oz (92.3 kg)   Temp Readings from Last 3 Encounters:  01/03/22 97.6 F (36.4 C) (Oral)  12/23/21 98.1 F (36.7 C) (Oral)  12/19/21 (!) 97.1 F (36.2 C)   BP Readings from Last 3 Encounters:  01/03/22 110/80  12/26/21 100/67  12/23/21 118/70   Pulse Readings from Last 3 Encounters:  01/03/22 71  12/26/21 86  12/23/21 84    Physical Exam Vitals and nursing note reviewed.  Constitutional:      Appearance: Normal appearance. He is well-developed and well-groomed.  HENT:     Head: Normocephalic and atraumatic.  Eyes:     Conjunctiva/sclera: Conjunctivae normal.     Pupils: Pupils are equal, round, and reactive to light.  Cardiovascular:     Rate and Rhythm: Normal rate and regular rhythm.     Heart sounds: Normal heart sounds.  Pulmonary:     Effort: Pulmonary effort is normal. No respiratory distress.     Breath sounds: Normal breath sounds.  Abdominal:     Tenderness: There is no abdominal tenderness.  Skin:    General: Skin is warm and moist.  Neurological:     General: No focal deficit present.     Mental Status: He is alert and oriented to person, place, and time. Mental status is at baseline.     Sensory: Sensation is intact.     Motor: Motor function is intact.     Coordination: Coordination is intact.     Gait: Gait is intact. Gait normal.     Comments: BL walks with cane  Psychiatric:        Attention and Perception: Attention and perception normal.        Mood and Affect: Mood and affect normal.        Speech: Speech normal.        Behavior: Behavior normal. Behavior is cooperative.        Thought Content: Thought content normal.        Cognition and Memory: Cognition and memory normal.        Judgment: Judgment normal.     Assessment  Plan  Lymphadenopathy - Plan: CT Soft Tissue Neck W Contrast due to increased  size right neck over 6 weeks of already swollen glands and hard time swallowing  CLL (chronic lymphocytic leukemia) (HCC) #  continue Venatoclax  At least 3 months; will repeat scan-reimaging in 3 months.  If continued progression is noted would recommend acalabrutinib per h/o Dr. B   Dysphagia, unspecified type - Plan: CT Soft Tissue Neck W Contrast -consider  ENT bx of enlarged glands and GI for EGD   CLL off Venclexta x 3 months due to 08/16/21 to 09/2021 had copd hosp then covid hosp then stroke Dr. Jacinto Reap states will repeat imaging in 3 months after restart of meds had appt 12/19/21   Cervicalgia - Plan: traMADol (ULTRAM) 50 MG tablet bid  Declines PT for now  If continues consider Dr. Sharlet Salina  Sore throat neg strep- Plan: amoxicillin-clavulanate (AUGMENTIN) 875-125 MG tablet, POCT rapid strep A       Provider: Dr. Olivia Mackie McLean-Scocuzza-Internal Medicine

## 2022-01-03 NOTE — Patient Instructions (Signed)
Lymphadenopathy  Lymphadenopathy means that your lymph glands are swollen or larger than normal. Lymph glands, also called lymph nodes, are collections of tissue that filter excess fluid, bacteria, viruses, and waste from your bloodstream. They are part of your body's disease-fighting system (immune system), which protects your body from germs. There may be different causes of lymphadenopathy, depending on where it is in your body. Some types go away on their own. Lymphadenopathy can occur anywhere that you have lymph glands, including these areas: Neck (cervical lymphadenopathy). Chest (mediastinal lymphadenopathy). Lungs (hilar lymphadenopathy). Underarms (axillary lymphadenopathy). Groin (inguinal lymphadenopathy). When your immune system responds to germs, infection-fighting cells and fluid build up in your lymph glands. This causes some swelling and enlargement. If the lymph nodes do not go back to normal size after you have an infection or disease, your health care provider may do tests. These tests help to monitor your condition and find the reason why the glands are still swollen and enlarged. Follow these instructions at home:  Get plenty of rest. Your health care provider may recommend over-the-counter medicines for pain. Take over-the-counter and prescription medicines only as told by your health care provider. If directed, apply heat to swollen lymph glands as often as told by your health care provider. Use the heat source that your health care provider recommends, such as a moist heat pack or a heating pad. Place a towel between your skin and the heat source. Leave the heat on for 20-30 minutes. Remove the heat if your skin turns bright red. This is especially important if you are unable to feel pain, heat, or cold. You may have a greater risk of getting burned. Check your affected lymph glands every day for changes. Check other lymph gland areas as told by your health care provider.  Check for changes such as: More swelling. Sudden increase in size. Redness or pain. Hardness. Keep all follow-up visits. This is important. Contact a health care provider if you have: Lymph glands that: Are still swollen after 2 weeks. Have suddenly gotten bigger or the swelling spreads. Are red, painful, or hard. Fluid leaking from the skin near an enlarged lymph gland. Problems with breathing. A fever, chills, or night sweats. Fatigue. A sore throat. Pain in your abdomen. Weight loss. Get help right away if you have: Severe pain. Chest pain. Shortness of breath. These symptoms may represent a serious problem that is an emergency. Do not wait to see if the symptoms will go away. Get medical help right away. Call your local emergency services (911 in the [place] Do not drive yourself to the hospital. Summary Lymphadenopathy means that your lymph glands are swollen or larger than normal. Lymph glands, also called lymph nodes, are collections of tissue that filter excess fluid, bacteria, viruses, and waste from the bloodstream. They are part of your body's disease-fighting system (immune system). Lymphadenopathy can occur anywhere that you have lymph glands. If the lymph nodes do not go back to normal size after you have an infection or disease, your health care provider may do tests to monitor your condition and find the reason why the glands are still swollen and enlarged. Check your affected lymph glands every day for changes. Check other lymph gland areas as told by your health care provider. This information is not intended to replace advice given to you by your health care provider. Make sure you discuss any questions you have with your health care provider. Document Revised: 05/02/2020 Document Reviewed: 05/02/2020 Elsevier Patient Education  2023 [employer]  

## 2022-01-03 NOTE — Telephone Encounter (Signed)
I discussed GI with him and wife they want to wait until repeat cT scan ive ordered  Will keep you posted

## 2022-01-07 ENCOUNTER — Other Ambulatory Visit: Payer: Self-pay | Admitting: Internal Medicine

## 2022-01-07 ENCOUNTER — Other Ambulatory Visit (HOSPITAL_COMMUNITY): Payer: Self-pay

## 2022-01-07 MED ORDER — VENETOCLAX 100 MG PO TABS
ORAL_TABLET | Freq: Every day | ORAL | 0 refills | Status: DC
  Filled 2022-01-07: qty 30, 30d supply, fill #0
  Filled 2022-01-20: qty 15, 15d supply, fill #0

## 2022-01-08 ENCOUNTER — Other Ambulatory Visit (HOSPITAL_COMMUNITY): Payer: Self-pay

## 2022-01-16 ENCOUNTER — Ambulatory Visit
Admission: RE | Admit: 2022-01-16 | Discharge: 2022-01-16 | Disposition: A | Discharge status: Home / Self Care | Payer: Medicare HMO | Source: Ambulatory Visit | Attending: Internal Medicine | Admitting: Internal Medicine

## 2022-01-16 DIAGNOSIS — R591 Generalized enlarged lymph nodes: Secondary | ICD-10-CM | POA: Insufficient documentation

## 2022-01-16 DIAGNOSIS — R1310 Dysphagia, unspecified: Secondary | ICD-10-CM | POA: Insufficient documentation

## 2022-01-16 MED ORDER — IOHEXOL 300 MG/ML  SOLN
75.0000 mL | Freq: Once | INTRAMUSCULAR | Status: AC | PRN
  Administered 2022-01-16: 75 mL via INTRAVENOUS

## 2022-01-18 DIAGNOSIS — A0472 Enterocolitis due to Clostridium difficile, not specified as recurrent: Secondary | ICD-10-CM

## 2022-01-18 HISTORY — DX: Enterocolitis due to Clostridium difficile, not specified as recurrent: A04.72

## 2022-01-20 ENCOUNTER — Other Ambulatory Visit (HOSPITAL_COMMUNITY): Payer: Self-pay

## 2022-01-20 ENCOUNTER — Encounter: Payer: Self-pay | Admitting: Internal Medicine

## 2022-01-20 ENCOUNTER — Inpatient Hospital Stay: Payer: Medicare HMO

## 2022-01-20 ENCOUNTER — Telehealth: Payer: Self-pay | Admitting: Pharmacy Technician

## 2022-01-20 ENCOUNTER — Inpatient Hospital Stay: Payer: Medicare HMO | Attending: Internal Medicine | Admitting: Internal Medicine

## 2022-01-20 ENCOUNTER — Telehealth: Payer: Self-pay | Admitting: Pharmacist

## 2022-01-20 VITALS — BP 118/61 | HR 83 | Temp 97.2°F | Ht 69.0 in | Wt 206.0 lb

## 2022-01-20 DIAGNOSIS — R112 Nausea with vomiting, unspecified: Secondary | ICD-10-CM | POA: Insufficient documentation

## 2022-01-20 DIAGNOSIS — N179 Acute kidney failure, unspecified: Secondary | ICD-10-CM | POA: Diagnosis not present

## 2022-01-20 DIAGNOSIS — C9110 Chronic lymphocytic leukemia of B-cell type not having achieved remission: Secondary | ICD-10-CM

## 2022-01-20 DIAGNOSIS — Z79899 Other long term (current) drug therapy: Secondary | ICD-10-CM | POA: Insufficient documentation

## 2022-01-20 DIAGNOSIS — E860 Dehydration: Secondary | ICD-10-CM | POA: Diagnosis not present

## 2022-01-20 DIAGNOSIS — R197 Diarrhea, unspecified: Secondary | ICD-10-CM | POA: Insufficient documentation

## 2022-01-20 DIAGNOSIS — E876 Hypokalemia: Secondary | ICD-10-CM | POA: Diagnosis not present

## 2022-01-20 LAB — CBC WITH DIFFERENTIAL/PLATELET
Abs Immature Granulocytes: 0.03 10*3/uL (ref 0.00–0.07)
Basophils Absolute: 0.1 10*3/uL (ref 0.0–0.1)
Basophils Relative: 1 %
Eosinophils Absolute: 0.3 10*3/uL (ref 0.0–0.5)
Eosinophils Relative: 3 %
HCT: 34.6 % — ABNORMAL LOW (ref 39.0–52.0)
Hemoglobin: 10.8 g/dL — ABNORMAL LOW (ref 13.0–17.0)
Immature Granulocytes: 0 %
Lymphocytes Relative: 34 %
Lymphs Abs: 2.7 10*3/uL (ref 0.7–4.0)
MCH: 30.9 pg (ref 26.0–34.0)
MCHC: 31.2 g/dL (ref 30.0–36.0)
MCV: 99.1 fL (ref 80.0–100.0)
Monocytes Absolute: 0.8 10*3/uL (ref 0.1–1.0)
Monocytes Relative: 10 %
Neutro Abs: 4 10*3/uL (ref 1.7–7.7)
Neutrophils Relative %: 52 %
Platelets: 156 10*3/uL (ref 150–400)
RBC: 3.49 MIL/uL — ABNORMAL LOW (ref 4.22–5.81)
RDW: 17 % — ABNORMAL HIGH (ref 11.5–15.5)
WBC: 7.9 10*3/uL (ref 4.0–10.5)
nRBC: 0 % (ref 0.0–0.2)

## 2022-01-20 LAB — COMPREHENSIVE METABOLIC PANEL
ALT: 28 U/L (ref 0–44)
AST: 33 U/L (ref 15–41)
Albumin: 3.6 g/dL (ref 3.5–5.0)
Alkaline Phosphatase: 95 U/L (ref 38–126)
Anion gap: 7 (ref 5–15)
BUN: 14 mg/dL (ref 8–23)
CO2: 25 mmol/L (ref 22–32)
Calcium: 8.5 mg/dL — ABNORMAL LOW (ref 8.9–10.3)
Chloride: 107 mmol/L (ref 98–111)
Creatinine, Ser: 0.89 mg/dL (ref 0.61–1.24)
GFR, Estimated: >60 mL/min (ref >60)
Glucose, Bld: 120 mg/dL — ABNORMAL HIGH (ref 70–99)
Potassium: 3.4 mmol/L — ABNORMAL LOW (ref 3.5–5.1)
Sodium: 139 mmol/L (ref 135–145)
Total Bilirubin: 0.7 mg/dL (ref 0.3–1.2)
Total Protein: 6.9 g/dL (ref 6.5–8.1)

## 2022-01-20 LAB — LACTATE DEHYDROGENASE: LDH: 186 U/L (ref 98–192)

## 2022-01-20 MED ORDER — CALQUENCE 100 MG PO TABS: 1.0000 | ORAL_TABLET | Freq: Two times a day (BID) | ORAL | 1 refills | Status: DC

## 2022-01-20 NOTE — Telephone Encounter (Signed)
Oral Oncology Pharmacist Encounter  Received new prescription for Calquence (acalabrutinib) for the treatment of progressive CLL, planned duration until disease progression or unacceptable drug toxicity.  Prescription dose and frequency assessed.   Current medication list in Epic reviewed, no DDIs with acalabrutinib identified.  Evaluated chart and no patient barriers to medication adherence identified.   Prescription has been e-scribed to the [facility] for benefits analysis and approval.  Oral Oncology Clinic will continue to follow for insurance authorization, copayment issues, initial counseling and start date.   Darl Pikes, PharmD, BCPS, BCOP, CPP Hematology/Oncology Clinical Pharmacist Practitioner [place]/DB/AP Oral [facility] [phone]  01/20/2022 12:17 PM

## 2022-01-20 NOTE — Assessment & Plan Note (Addendum)
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 MAY 27th, 2023-  Increased adenopathy above the diaphragm with stable prominent/mildly enlarged lymph nodes below the diaphragm and no splenomegaly [however patient has been off venetoclax for 3 months months prior-sec to hospitalizations/etc]. JUNE 29th, 2023- NECK CT- Further progression of adenopathy since 12/12/2021 in the neck and included chest ~lymphadenopathy approximately 1 to 1.2 cm in size previously 0.5 cm.   # DISCONTINUE  Venatoclax given the doubling in size of the lymph nodes/progression is noted on CT scan of the neck.   #Patient had previous intolerance to ibrutinib.  I would recommend acalabrutinib 100 mg twice a day.  Again reviewed the potential side effects including but not limited to nausea vomiting diarrhea joint pains etc headaches etc; increased bruising.  Overall better tolerance than ibrutinib.  Free voucher; discussed with Bethena Roys.   # MAY 2023-CT scan-incidental-  new scattered bilateral pulmonary nodules measure up to 5 mm and are present on a hazy background of ground-glass opacity, favored to reflect infectious or inflammatory process.  We will repeat imaging of the line.  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  #Diarrhea-G-1 ? venatolclax- monitor for now.STABLE.   # Low immunoglobulins IgG 330/recent severe respiratory infection/CLL-s/pI IVIG #4/4 [last April 2022].NOV 2022- IgG > 500.  Stable  # A.fib-on amiodarone [drug interaction-amiodarone; Dr.Gollan] sinus rhythm-on Eliquis/CHF- lasix 40/d- s/p Cath [AUG, 2022- Echo-55%] -STABLE.  # MIld Anemia Hb ~10.7 awaiting iron studies from today.  # Joint pains/ Muscle pain [improved after stopping statin; shot-ok]-sec to Ventoclax-G-1-2;?  continue tramadol/CBD prn; refilled- -STABLE   DISPOSITION: #  follow up in 6 weeks  MD; labs- cbc/cmp; [facility] - Dr.B  # I reviewed the blood work- with the patient in detail; also reviewed the imaging independently [as  summarized above]; and with the patient in detail.

## 2022-01-20 NOTE — Patient Instructions (Signed)
#   STOP VENCLEXTA  # start calequence when available.

## 2022-01-20 NOTE — Progress Notes (Signed)
He had call from [employer] about his RX funding, she is going to come talk with him about this.   Had CT CAP last week.

## 2022-01-20 NOTE — Progress Notes (Signed)
[place] OFFICE PROGRESS NOTE  Patient Care Team: Leone Haven, MD as PCP - General (Family Medicine) Rockey Situ Kathlene November, MD as PCP - Cardiology (Cardiology) [name], MD as Consulting Physician (Cardiology) Cammie Sickle, MD as Medical Oncologist (Hematology and Oncology)   Cancer Staging  No matching staging information was found for the patient.   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  MARCH 2017- CT N/C/A/P- NED. STOP Ritux; surveillance   # AUG 2019- CT/PET- progression/NO transformation;  # NOV 2019- Progression; started Ibrutinib 420 mg/d. STOPPED in end of feb sec to extreme fatigue/joint pains/cramps  #November 01, 2018-start ibrutinib 280 mg a day; December 20, 2018-discontinue ibrutinib secondary multiple side effects.   #January 03, 2019 start Gazyva + Ven [July]; finished Dec 2020-; started VLawson Fiscalmaintenance [200 mg a day; DI-amiodarone]; SEP 2022- congestive heart failure; s/p cardiac cath-noted to have CAD however no stents placed.  2D echo- EF-55%  #Mid March 2021-venetoclax dose reduced to 100 mg [second diarrhea]. HELD in [place]2023 x3 months-multiple admissions to the hospital/pneumonia infection stroke x 3 m.   # MAY 2023-progression of disease in the neck ches; Nov 18, 2021-RE-started venetoclax;   # JUNE 29th, 2023-progression of disease in the neck- STOP VENAOCLAX; START ACALABRUTINIB 100 mg BID [previous intolerance to ibrutinib]    #? 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 POSITIVE FOR TP53  DELETION; IGVH- UN-MUTATED [poor prognosis]  SURVIVORSHIP: p  DIAGNOSIS: CLL   STAGE: IV  ;GOALS: control  CURRENT/MOST RECENT THERAPY : venetoclax     CLL [place]  01/03/2019 -  Chemotherapy   The patient had obinutuzumab (GAZYVA) 100 mg in sodium chloride 0.9 % 100 mL (0.9615 mg/mL) chemo infusion, 100 mg, Intravenous, Once, 6 of 6 cycles Administration: 100 mg (01/03/2019), 900 mg (01/04/2019), 1,000 mg (01/10/2019), 1,000 mg (01/31/2019), 1,000 mg (01/17/2019), 1,000 mg (02/28/2019), 1,000 mg (04/07/2019), 1,000 mg (05/05/2019), 1,000 mg (06/02/2019)  for chemotherapy treatment.      INTERVAL HISTORY: Accompanied by his wife.   Ambulating with a cane.   Michael DANFORD89y.o.  male CLL-high risk currently on venetoclax re-started May 1st, 2023 [facility] the [facility]is here for follow-up/review results of the CT scan of his neck.  Patient states his difficulty swallowing is improved since post antibiotics PCP.  However continues to have lymph nodes in his neck.  Patient denies any worsening lumps or bumps.   Complains of chronic joint pain.  Otherwise no fever no chills.  No nausea no vomiting.  No headache.  No weight loss.  No night sweats.  Patient has not had any further hospitalizations.  \Review of Systems  Constitutional:  Positive for malaise/fatigue. Negative for chills, diaphoresis and fever.  HENT:  Negative for nosebleeds and sore throat.   Eyes:  Negative for double vision.  Respiratory:  Negative for hemoptysis, shortness of breath and wheezing.   Cardiovascular:  Negative for chest pain, palpitations, orthopnea and leg swelling.  Gastrointestinal:  Positive for diarrhea. Negative for abdominal pain, blood  in stool, constipation, heartburn, melena, nausea and vomiting.  Genitourinary:  Negative for dysuria, frequency and urgency.  Musculoskeletal:  Positive for back pain, joint pain and  myalgias.  Skin: Negative.  Negative for itching and rash.  Neurological:  Negative for dizziness, tingling, focal weakness and headaches.  Psychiatric/Behavioral:  Negative for depression. The patient is not nervous/anxious and does not have insomnia.       PAST MEDICAL HISTORY :  Past Medical History:  Diagnosis Date  . Anxiety   . Arthritis   . Atrial fibrillation ([place])    a. Dx 2013, recurred 02/2014, CHA2DS2VASc = 3 -->placed on Eliquis;  b. 02/2014 Echo: EF 50-55%, mid and apical anterior septum and mid and apical inf septum are abnl, mild to mod Ao sclerosis w/o AS.  Marland Kitchen Cancer associated pain   . Chicken pox   . Chronic lymphocytic leukemia ([place])    a. Dx 02/2014.  Marland Kitchen CLL (chronic lymphocytic leukemia) ([place])   . Complication of anesthesia    History of  PTSD--do not touch patient when waking up from surgery.  Marland Kitchen COPD (chronic obstructive pulmonary disease) ([place])   . Coronary artery disease    a. 04/2009 CABG x 3 (LIMA->LAD, VG->OM1, VG->PDA);  b. 09/2009 Cath: occluded VG x 2 w/ patent LIMA and L->R collats. EF 55%, mild antlat HK;  c. 10/2011 MV: EF 53%, no isch/infarct-->low risk.  Marland Kitchen Dysrhythmia    hx of a-fib  . GERD (gastroesophageal reflux disease)    occasional  . History of chemotherapy 2015-2016  . HOH (hard of hearing)    Bilateral Hearing Aids  . Hypertension   . Myocardial infarction ([place]) 2010  . OSA on CPAP    USE C-PAP  . Presence of permanent cardiac pacemaker 2017  . PTSD (post-traumatic stress disorder)   . PTSD (post-traumatic stress disorder)   . Pure hypercholesterolemia   . Rheumatic fever 1959  . 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 TIA (transient ischemic attack) 11/02/2015    PAST SURGICAL HISTORY :   Past Surgical History:  Procedure Laterality Date  . ABDOMINAL HERNIA REPAIR    . APPENDECTOMY  06/21/1985  . CARDIAC CATHETERIZATION  2010; 2011   ; Dr Fletcher Anon  . CORONARY ARTERY BYPASS GRAFT  04/2009   "CABG X3"  . EP  IMPLANTABLE DEVICE N/A 03/03/2016   Procedure: Pacemaker Implant;  Surgeon: Deboraha Sprang, MD;  Location: [facility];  Service: Cardiovascular;  Laterality: N/A;  . FOREIGN BODY REMOVAL  1968   "shrapnel in my tailbone"  . INGUINAL HERNIA REPAIR Right   . INSERT / REPLACE / REMOVE PACEMAKER    . JOINT REPLACEMENT Right 2018  . LAPAROSCOPIC CHOLECYSTECTOMY    . RIGHT/LEFT HEART CATH AND CORONARY/GRAFT ANGIOGRAPHY N/A 02/04/2021   Procedure: RIGHT/LEFT HEART CATH AND CORONARY/GRAFT ANGIOGRAPHY;  Surgeon: Nelva Bush, MD;  Location: [facility];  Service: Cardiovascular;  Laterality: N/A;  . TONSILLECTOMY AND ADENOIDECTOMY  1956  . TOTAL HIP ARTHROPLASTY Right 10/22/2016   Procedure: TOTAL HIP ARTHROPLASTY;  Surgeon: Dereck Leep, MD;  Location: [facility];  Service: Orthopedics;  Laterality: Right;  . TOTAL HIP ARTHROPLASTY Left 11/04/2017   Procedure: TOTAL HIP ARTHROPLASTY;  Surgeon: Dereck Leep, MD;  Location: [facility];  Service: Orthopedics;  Laterality: Left;    FAMILY HISTORY :   Family History  Problem Relation Age of Onset  . Heart disease Mother   . Heart attack Mother   .  Coronary artery disease Other        family history    SOCIAL HISTORY:   Social History   Tobacco Use  . Smoking status: Former    Packs/day: 1.00    Years: 40.00    Total pack years: 40.00    Types: Cigarettes    Quit date: 07/21/2006    Years since quitting: 15.5  . Smokeless tobacco: Never  Vaping Use  . Vaping Use: Former  Substance Use Topics  . Alcohol use: Not Currently  . Drug use: No    ALLERGIES:  has No Known Allergies.  MEDICATIONS:  Current Outpatient Medications  Medication Sig Dispense Refill  . acetaminophen (TYLENOL) 325 MG tablet Take 1-2 tablets (325-650 mg total) by mouth every 4 (four) hours as needed for mild pain.    Marland Kitchen acyclovir (ZOVIRAX) 400 MG tablet TAKE 1 TABLET(400 MG) BY MOUTH TWICE DAILY 60 tablet 6  . albuterol (VENTOLIN HFA) 108 (90 Base)  MCG/ACT inhaler Inhale 2 puffs into the lungs every 6 (six) hours as needed for wheezing or shortness of breath. 1 each 0  . Alirocumab (PRALUENT) 150 MG/ML SOAJ Inject 150 mg into the skin every 14 (fourteen) days. 2 mL 12  . amiodarone (PACERONE) 200 MG tablet Take 1/2 tablet (100 mg) by mouth twice daily 5 days a week 60 tablet 6  . apixaban (ELIQUIS) 5 MG TABS tablet Take 5 mg by mouth 2 (two) times daily.    Marland Kitchen azelastine (ASTELIN) 0.1 % nasal spray Place 2 sprays into both nostrils 2 (two) times daily. Use in each nostril as directed 30 mL 1  . budesonide-formoterol (SYMBICORT) 160-4.5 MCG/ACT inhaler Inhale 2 puffs into the lungs 2 (two) times daily. 1 Inhaler 11  . cetirizine (ZYRTEC) 10 MG tablet Take 10 mg by mouth daily as needed for allergies.     . Coenzyme Q10 (COQ10) 200 MG CAPS Take 200 mg by mouth daily.    Marland Kitchen ezetimibe (ZETIA) 10 MG tablet Take 1 tablet (10 mg total) by mouth daily. 90 tablet 3  . furosemide (LASIX) 40 MG tablet Take 1 tablet (40 mg total) by mouth daily. 30 tablet 0  . guaiFENesin-dextromethorphan (ROBITUSSIN DM) 100-10 MG/5ML syrup Take 5 mLs by mouth every 4 (four) hours as needed for cough. 118 mL 0  . Krill Oil 350 MG CAPS Take 350 mg by mouth daily as needed (Heart).    . mirtazapine (REMERON) 15 MG tablet Take 15 mg by mouth at bedtime as needed (for panic associated with PTSD).     . Multiple Vitamin (MULTIVITAMIN WITH MINERALS) TABS tablet Take 1 tablet by mouth daily.    . nitroGLYCERIN (NITROSTAT) 0.4 MG SL tablet Place 1 tablet (0.4 mg total) under the tongue every 5 (five) minutes as needed for chest pain. 25 tablet 6  . Tiotropium Bromide Monohydrate 2.5 MCG/ACT AERS Inhale 2 puffs into the lungs at bedtime. Spiriva    . traMADol (ULTRAM) 50 MG tablet Take 1 tablet (50 mg total) by mouth every 12 (twelve) hours as needed. 60 tablet 0  . venetoclax (VENCLEXTA) 100 MG tablet Take 100 mg by mouth daily. Tablets should be swallowed whole with a meal and a  full glass of water.    . venetoclax (VENCLEXTA) 100 MG tablet TAKE 1 TABLET BY MOUTH ONCE DAILY 30 tablet 0   No current facility-administered medications for this visit.    PHYSICAL EXAMINATION: ECOG PERFORMANCE STATUS: 1 - Symptomatic but completely ambulatory  BP  118/61 (BP Location: Left Arm, Patient Position: Sitting, Cuff Size: Normal)   Pulse 83   Temp (!) 97.2 F (36.2 C) (Tympanic)   Ht 5' 9" (1.753 m)   Wt 206 lb (93.4 kg)   SpO2 99%   BMI 30.42 kg/m   Filed Weights   01/20/22 1032  Weight: 206 lb (93.4 kg)   1 cm size left neck adenopathy felt/jugulodigastric  Physical Exam HENT:     Head: Normocephalic and atraumatic.     Mouth/Throat:     Pharynx: No oropharyngeal exudate.  Eyes:     Pupils: Pupils are equal, round, and reactive to light.  Cardiovascular:     Rate and Rhythm: Normal rate and regular rhythm.  Pulmonary:     Effort: No respiratory distress.     Breath sounds: No wheezing.  Abdominal:     General: Bowel sounds are normal. There is no distension.     Palpations: Abdomen is soft. There is no mass.     Tenderness: There is no abdominal tenderness. There is no guarding or rebound.  Musculoskeletal:        General: No tenderness. Normal range of motion.     Cervical back: Normal range of motion and neck supple.  Skin:    General: Skin is warm.  Neurological:     Mental Status: He is alert and oriented to person, place, and time.  Psychiatric:        Mood and Affect: Affect normal.    LABORATORY DATA:  I have reviewed the data as listed    Component Value Date/Time   NA 139 01/20/2022 1027   NA 143 03/11/2021 1136   NA 139 10/11/2014 1800   K 3.4 (L) 01/20/2022 1027   K 3.3 (L) 10/11/2014 1800   CL 107 01/20/2022 1027   CL 106 10/11/2014 1800   CO2 25 01/20/2022 1027   CO2 27 10/11/2014 1800   GLUCOSE 120 (H) 01/20/2022 1027   GLUCOSE 107 (H) 10/11/2014 1800   BUN 14 01/20/2022 1027   BUN 13 03/11/2021 1136   BUN 15 10/11/2014  1800   CREATININE 0.89 01/20/2022 1027   CREATININE 0.89 10/11/2014 1800   CALCIUM 8.5 (L) 01/20/2022 1027   CALCIUM 8.8 (L) 10/11/2014 1800   PROT 6.9 01/20/2022 1027   PROT 6.7 05/18/2017 1048   PROT 6.4 (L) 10/11/2014 1800   ALBUMIN 3.6 01/20/2022 1027   ALBUMIN 4.3 05/18/2017 1048   ALBUMIN 4.1 10/11/2014 1800   AST 33 01/20/2022 1027   AST 23 10/11/2014 1800   ALT 28 01/20/2022 1027   ALT 22 10/11/2014 1800   ALKPHOS 95 01/20/2022 1027   ALKPHOS 61 10/11/2014 1800   BILITOT 0.7 01/20/2022 1027   BILITOT 0.7 05/18/2017 1048   BILITOT 0.9 10/11/2014 1800   GFRNONAA >60 01/20/2022 1027   GFRNONAA >60 10/11/2014 1800   GFRAA >60 04/12/2020 0959   GFRAA >60 10/11/2014 1800    No results found for: "SPEP", "UPEP"  Lab Results  Component Value Date   WBC 7.9 01/20/2022   NEUTROABS 4.0 01/20/2022   HGB 10.8 (L) 01/20/2022   HCT 34.6 (L) 01/20/2022   MCV 99.1 01/20/2022   PLT 156 01/20/2022      Chemistry      Component Value Date/Time   NA 139 01/20/2022 1027   NA 143 03/11/2021 1136   NA 139 10/11/2014 1800   K 3.4 (L) 01/20/2022 1027   K 3.3 (L) 10/11/2014 1800  CL 107 01/20/2022 1027   CL 106 10/11/2014 1800   CO2 25 01/20/2022 1027   CO2 27 10/11/2014 1800   BUN 14 01/20/2022 1027   BUN 13 03/11/2021 1136   BUN 15 10/11/2014 1800   CREATININE 0.89 01/20/2022 1027   CREATININE 0.89 10/11/2014 1800      Component Value Date/Time   CALCIUM 8.5 (L) 01/20/2022 1027   CALCIUM 8.8 (L) 10/11/2014 1800   ALKPHOS 95 01/20/2022 1027   ALKPHOS 61 10/11/2014 1800   AST 33 01/20/2022 1027   AST 23 10/11/2014 1800   ALT 28 01/20/2022 1027   ALT 22 10/11/2014 1800   BILITOT 0.7 01/20/2022 1027   BILITOT 0.7 05/18/2017 1048   BILITOT 0.9 10/11/2014 1800       RADIOGRAPHIC STUDIES: I have personally reviewed the radiological images as listed and agreed with the findings in the report. No results found.   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 MAY 27th, 2023-  Increased adenopathy above the diaphragm with stable prominent/mildly enlarged lymph nodes below the diaphragm and no splenomegaly [however patient has been off venetoclax for 3 months months prior-sec to hospitalizations/etc]. JUNE 29th, 2023- NECK CT- Further progression of adenopathy since 12/12/2021 in the neck and included chest ~lymphadenopathy approximately 1 to 1.2 cm in size previously 0.5 cm.   #  DISCONTINUE  Venatoclax given the doubling in size of the lymph nodes/progression is noted on CT scan of the neck.   #Patient had previous intolerance to ibrutinib.  I would recommend acalabrutinib 100 mg twice a day.  Again reviewed the potential side effects including but not limited to nausea vomiting diarrhea joint pains etc headaches etc; increased bruising.  Overall better tolerance than ibrutinib.  Free voucher; discussed with Bethena Roys.   # MAY 2023-CT scan-incidental-  new scattered bilateral pulmonary nodules measure up to 5 mm and are present on a hazy background of ground-glass opacity, favored to reflect infectious or inflammatory process.  We will repeat imaging of the line.  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  #Diarrhea-G-1 ? venatolclax- monitor for now.STABLE.   # Low immunoglobulins IgG 330/recent severe respiratory infection/CLL-s/pI IVIG #4/4 [last April 2022].NOV 2022- IgG > 500.  Stable  # A.fib-on amiodarone [drug interaction-amiodarone; Dr.Gollan] sinus rhythm-on Eliquis/CHF- lasix 40/d- s/p Cath [AUG, 2022- Echo-55%] -STABLE.  # MIld Anemia Hb ~10.7 awaiting iron studies from today.  # Joint pains/ Muscle pain [improved after stopping statin; shot-ok]-sec to Ventoclax-G-1-2;?  continue tramadol/CBD prn; refilled- -STABLE   DISPOSITION: #  follow up in 6 weeks  MD; labs- cbc/cmp; [facility] - Dr.B  # I reviewed the blood work- with the patient in detail; also reviewed the imaging  independently [as summarized above]; and with the patient in detail.            Orders Placed This Encounter  Procedures  . CBC with Differential/Platelet    Standing Status:   Future    Standing Expiration Date:   01/21/2023  . Comprehensive metabolic panel    Standing Status:   Future    Standing Expiration Date:   01/21/2023  . Lactate dehydrogenase    Standing Status:   Future    Standing Expiration Date:   01/21/2023    All questions were answered. The patient knows to call the clinic with any problems, questions or concerns.      Cammie Sickle, MD 01/20/2022 12:27 PM

## 2022-01-21 LAB — HAPTOGLOBIN: Haptoglobin: 51 mg/dL (ref 34–355)

## 2022-01-22 ENCOUNTER — Other Ambulatory Visit (HOSPITAL_COMMUNITY): Payer: Self-pay

## 2022-01-22 NOTE — Telephone Encounter (Signed)
Oral Oncology Patient Advocate Encounter  Received notification from [employer] that patient has been successfully enrolled into their program to receive Calquence from the manufacturer at $0 out of pocket until 07/20/22.    I called and left a voicemail of the approval and phone number to [employer].  Specialty Pharmacy that will dispense medication is  [facility].    Patient knows to call the office with questions or concerns.   Oral Oncology Clinic will continue to follow.  [place] [facility] [place] Phone [phone] Fax [phone] 01/22/2022 3:27 PM

## 2022-01-27 ENCOUNTER — Other Ambulatory Visit: Payer: Self-pay

## 2022-01-27 ENCOUNTER — Telehealth: Payer: Self-pay | Admitting: *Deleted

## 2022-01-27 ENCOUNTER — Inpatient Hospital Stay
Admission: EM | Admit: 2022-01-27 | Discharge: 2022-01-29 | DRG: 372 | Disposition: A | Discharge status: Home / Self Care | Payer: No Typology Code available for payment source | Attending: Internal Medicine | Admitting: Internal Medicine

## 2022-01-27 ENCOUNTER — Emergency Department: Payer: No Typology Code available for payment source

## 2022-01-27 ENCOUNTER — Other Ambulatory Visit: Payer: Self-pay | Admitting: *Deleted

## 2022-01-27 ENCOUNTER — Inpatient Hospital Stay: Payer: Medicare HMO

## 2022-01-27 ENCOUNTER — Inpatient Hospital Stay (HOSPITAL_BASED_OUTPATIENT_CLINIC_OR_DEPARTMENT_OTHER): Payer: Medicare HMO | Admitting: Hospice and Palliative Medicine

## 2022-01-27 VITALS — BP 117/73 | HR 80 | Temp 97.3°F | Resp 22

## 2022-01-27 DIAGNOSIS — F4310 Post-traumatic stress disorder, unspecified: Secondary | ICD-10-CM | POA: Diagnosis present

## 2022-01-27 DIAGNOSIS — D849 Immunodeficiency, unspecified: Secondary | ICD-10-CM | POA: Diagnosis present

## 2022-01-27 DIAGNOSIS — Z8673 Personal history of transient ischemic attack (TIA), and cerebral infarction without residual deficits: Secondary | ICD-10-CM

## 2022-01-27 DIAGNOSIS — Z950 Presence of cardiac pacemaker: Secondary | ICD-10-CM

## 2022-01-27 DIAGNOSIS — C9110 Chronic lymphocytic leukemia of B-cell type not having achieved remission: Secondary | ICD-10-CM

## 2022-01-27 DIAGNOSIS — I252 Old myocardial infarction: Secondary | ICD-10-CM

## 2022-01-27 DIAGNOSIS — Z96643 Presence of artificial hip joint, bilateral: Secondary | ICD-10-CM | POA: Diagnosis present

## 2022-01-27 DIAGNOSIS — K219 Gastro-esophageal reflux disease without esophagitis: Secondary | ICD-10-CM | POA: Diagnosis present

## 2022-01-27 DIAGNOSIS — Z87891 Personal history of nicotine dependence: Secondary | ICD-10-CM

## 2022-01-27 DIAGNOSIS — R112 Nausea with vomiting, unspecified: Secondary | ICD-10-CM

## 2022-01-27 DIAGNOSIS — R197 Diarrhea, unspecified: Secondary | ICD-10-CM

## 2022-01-27 DIAGNOSIS — Z9989 Dependence on other enabling machines and devices: Secondary | ICD-10-CM

## 2022-01-27 DIAGNOSIS — E876 Hypokalemia: Secondary | ICD-10-CM

## 2022-01-27 DIAGNOSIS — Z951 Presence of aortocoronary bypass graft: Secondary | ICD-10-CM

## 2022-01-27 DIAGNOSIS — Z9221 Personal history of antineoplastic chemotherapy: Secondary | ICD-10-CM

## 2022-01-27 DIAGNOSIS — I110 Hypertensive heart disease with heart failure: Secondary | ICD-10-CM | POA: Diagnosis present

## 2022-01-27 DIAGNOSIS — E860 Dehydration: Secondary | ICD-10-CM | POA: Diagnosis present

## 2022-01-27 DIAGNOSIS — G4733 Obstructive sleep apnea (adult) (pediatric): Secondary | ICD-10-CM

## 2022-01-27 DIAGNOSIS — Z8249 Family history of ischemic heart disease and other diseases of the circulatory system: Secondary | ICD-10-CM

## 2022-01-27 DIAGNOSIS — J449 Chronic obstructive pulmonary disease, unspecified: Secondary | ICD-10-CM | POA: Diagnosis present

## 2022-01-27 DIAGNOSIS — I5032 Chronic diastolic (congestive) heart failure: Secondary | ICD-10-CM | POA: Diagnosis present

## 2022-01-27 DIAGNOSIS — Z79899 Other long term (current) drug therapy: Secondary | ICD-10-CM

## 2022-01-27 DIAGNOSIS — Z7901 Long term (current) use of anticoagulants: Secondary | ICD-10-CM

## 2022-01-27 DIAGNOSIS — A0472 Enterocolitis due to Clostridium difficile, not specified as recurrent: Principal | ICD-10-CM | POA: Diagnosis present

## 2022-01-27 DIAGNOSIS — I959 Hypotension, unspecified: Secondary | ICD-10-CM

## 2022-01-27 DIAGNOSIS — E7800 Pure hypercholesterolemia, unspecified: Secondary | ICD-10-CM | POA: Diagnosis present

## 2022-01-27 DIAGNOSIS — I2510 Atherosclerotic heart disease of native coronary artery without angina pectoris: Secondary | ICD-10-CM | POA: Diagnosis present

## 2022-01-27 DIAGNOSIS — J441 Chronic obstructive pulmonary disease with (acute) exacerbation: Secondary | ICD-10-CM | POA: Diagnosis present

## 2022-01-27 DIAGNOSIS — N179 Acute kidney failure, unspecified: Secondary | ICD-10-CM | POA: Diagnosis present

## 2022-01-27 DIAGNOSIS — I480 Paroxysmal atrial fibrillation: Secondary | ICD-10-CM | POA: Diagnosis present

## 2022-01-27 DIAGNOSIS — Z7951 Long term (current) use of inhaled steroids: Secondary | ICD-10-CM

## 2022-01-27 LAB — CBC WITH DIFFERENTIAL/PLATELET
Abs Immature Granulocytes: 0.05 10*3/uL (ref 0.00–0.07)
Abs Immature Granulocytes: 0.05 10*3/uL (ref 0.00–0.07)
Basophils Absolute: 0.1 10*3/uL (ref 0.0–0.1)
Basophils Absolute: 0 10*3/uL (ref 0.0–0.1)
Basophils Relative: 1 %
Basophils Relative: 0 %
Eosinophils Absolute: 0.1 10*3/uL (ref 0.0–0.5)
Eosinophils Absolute: 0 10*3/uL (ref 0.0–0.5)
Eosinophils Relative: 1 %
Eosinophils Relative: 0 %
HCT: 38.8 % — ABNORMAL LOW (ref 39.0–52.0)
HCT: 34.8 % — ABNORMAL LOW (ref 39.0–52.0)
Hemoglobin: 12.6 g/dL — ABNORMAL LOW (ref 13.0–17.0)
Hemoglobin: 11 g/dL — ABNORMAL LOW (ref 13.0–17.0)
Immature Granulocytes: 0 %
Immature Granulocytes: 1 %
Lymphocytes Relative: 38 %
Lymphocytes Relative: 20 %
Lymphs Abs: 4.7 10*3/uL — ABNORMAL HIGH (ref 0.7–4.0)
Lymphs Abs: 1.7 10*3/uL (ref 0.7–4.0)
MCH: 31.9 pg (ref 26.0–34.0)
MCH: 31 pg (ref 26.0–34.0)
MCHC: 32.5 g/dL (ref 30.0–36.0)
MCHC: 31.6 g/dL (ref 30.0–36.0)
MCV: 98.2 fL (ref 80.0–100.0)
MCV: 98 fL (ref 80.0–100.0)
Monocytes Absolute: 1.1 10*3/uL — ABNORMAL HIGH (ref 0.1–1.0)
Monocytes Absolute: 0.1 10*3/uL (ref 0.1–1.0)
Monocytes Relative: 9 %
Monocytes Relative: 2 %
Neutro Abs: 6.6 10*3/uL (ref 1.7–7.7)
Neutro Abs: 6.4 10*3/uL (ref 1.7–7.7)
Neutrophils Relative %: 51 %
Neutrophils Relative %: 77 %
Platelets: 210 10*3/uL (ref 150–400)
Platelets: 186 10*3/uL (ref 150–400)
RBC: 3.95 MIL/uL — ABNORMAL LOW (ref 4.22–5.81)
RBC: 3.55 MIL/uL — ABNORMAL LOW (ref 4.22–5.81)
RDW: 16.5 % — ABNORMAL HIGH (ref 11.5–15.5)
RDW: 16.4 % — ABNORMAL HIGH (ref 11.5–15.5)
WBC: 12.6 10*3/uL — ABNORMAL HIGH (ref 4.0–10.5)
WBC: 8.2 10*3/uL (ref 4.0–10.5)
nRBC: 0 % (ref 0.0–0.2)
nRBC: 0 % (ref 0.0–0.2)

## 2022-01-27 LAB — BASIC METABOLIC PANEL
Anion gap: 8 (ref 5–15)
BUN: 21 mg/dL (ref 8–23)
CO2: 21 mmol/L — ABNORMAL LOW (ref 22–32)
Calcium: 8.4 mg/dL — ABNORMAL LOW (ref 8.9–10.3)
Chloride: 108 mmol/L (ref 98–111)
Creatinine, Ser: 0.95 mg/dL (ref 0.61–1.24)
GFR, Estimated: >60 mL/min (ref >60)
Glucose, Bld: 160 mg/dL — ABNORMAL HIGH (ref 70–99)
Potassium: 3.6 mmol/L (ref 3.5–5.1)
Sodium: 137 mmol/L (ref 135–145)

## 2022-01-27 LAB — GASTROINTESTINAL PANEL BY PCR, STOOL (REPLACES STOOL CULTURE)

## 2022-01-27 LAB — COMPREHENSIVE METABOLIC PANEL
ALT: 34 U/L (ref 0–44)
AST: 41 U/L (ref 15–41)
Albumin: 3.8 g/dL (ref 3.5–5.0)
Alkaline Phosphatase: 128 U/L — ABNORMAL HIGH (ref 38–126)
Anion gap: 9 (ref 5–15)
BUN: 23 mg/dL (ref 8–23)
CO2: 21 mmol/L — ABNORMAL LOW (ref 22–32)
Calcium: 8.7 mg/dL — ABNORMAL LOW (ref 8.9–10.3)
Chloride: 105 mmol/L (ref 98–111)
Creatinine, Ser: 1.3 mg/dL — ABNORMAL HIGH (ref 0.61–1.24)
GFR, Estimated: 57 mL/min — ABNORMAL LOW (ref >60)
Glucose, Bld: 138 mg/dL — ABNORMAL HIGH (ref 70–99)
Potassium: 3.1 mmol/L — ABNORMAL LOW (ref 3.5–5.1)
Sodium: 135 mmol/L (ref 135–145)
Total Bilirubin: 1.1 mg/dL (ref 0.3–1.2)
Total Protein: 7.7 g/dL (ref 6.5–8.1)

## 2022-01-27 LAB — C DIFFICILE QUICK SCREEN W PCR REFLEX
C Diff antigen: POSITIVE — AB
C Diff interpretation: DETECTED
C Diff toxin: POSITIVE — AB

## 2022-01-27 LAB — MAGNESIUM: Magnesium: 2 mg/dL (ref 1.7–2.4)

## 2022-01-27 MED ORDER — VANCOMYCIN HCL 125 MG PO CAPS: 125.0000 mg | ORAL_CAPSULE | Freq: Two times a day (BID) | ORAL | Status: DC

## 2022-01-27 MED ORDER — AMIODARONE HCL 100 MG PO TABS
100.0000 mg | ORAL_TABLET | ORAL | Status: DC
  Administered 2022-01-28 (×2): 100 mg via ORAL
  Filled 2022-01-27 (×2): qty 1

## 2022-01-27 MED ORDER — PROCHLORPERAZINE MALEATE 10 MG PO TABS: 10.0000 mg | ORAL_TABLET | Freq: Four times a day (QID) | ORAL | 0 refills | Status: DC | PRN

## 2022-01-27 MED ORDER — VANCOMYCIN HCL 125 MG PO CAPS: 125.0000 mg | ORAL_CAPSULE | Freq: Four times a day (QID) | ORAL | Status: DC

## 2022-01-27 MED ORDER — ACETAMINOPHEN 325 MG PO TABS: 650.0000 mg | ORAL_TABLET | Freq: Four times a day (QID) | ORAL | Status: DC | PRN

## 2022-01-27 MED ORDER — VANCOMYCIN HCL 125 MG PO CAPS: 125.0000 mg | ORAL_CAPSULE | ORAL | Status: DC

## 2022-01-27 MED ORDER — SODIUM CHLORIDE 0.9 % IV SOLN
10.0000 mg | Freq: Once | INTRAVENOUS | Status: AC
  Administered 2022-01-27: 10 mg via INTRAVENOUS
  Filled 2022-01-27: qty 1

## 2022-01-27 MED ORDER — SODIUM CHLORIDE 0.9 % IV SOLN
INTRAVENOUS | Status: DC
  Filled 2022-01-27 (×2): qty 250

## 2022-01-27 MED ORDER — SODIUM CHLORIDE 0.9 % IV SOLN: Freq: Once | INTRAVENOUS | Status: AC

## 2022-01-27 MED ORDER — ONDANSETRON HCL 8 MG PO TABS: 8.0000 mg | ORAL_TABLET | Freq: Three times a day (TID) | ORAL | 1 refills | Status: DC | PRN

## 2022-01-27 MED ORDER — ONDANSETRON HCL 4 MG/2ML IJ SOLN
4.0000 mg | Freq: Four times a day (QID) | INTRAMUSCULAR | Status: DC | PRN
  Administered 2022-01-28: 4 mg via INTRAVENOUS
  Filled 2022-01-27: qty 2

## 2022-01-27 MED ORDER — ONDANSETRON HCL 4 MG/2ML IJ SOLN
8.0000 mg | Freq: Once | INTRAMUSCULAR | Status: AC
  Administered 2022-01-27: 8 mg via INTRAVENOUS
  Filled 2022-01-27: qty 4

## 2022-01-27 MED ORDER — DIPHENOXYLATE-ATROPINE 2.5-0.025 MG PO TABS: 1.0000 | ORAL_TABLET | Freq: Four times a day (QID) | ORAL | 0 refills | Status: DC | PRN

## 2022-01-27 MED ORDER — ONDANSETRON HCL 4 MG/2ML IJ SOLN: 4.0000 mg | Freq: Once | INTRAMUSCULAR | Status: DC

## 2022-01-27 MED ORDER — ACETAMINOPHEN 650 MG RE SUPP: 650.0000 mg | Freq: Four times a day (QID) | RECTAL | Status: DC | PRN

## 2022-01-27 MED ORDER — SODIUM CHLORIDE 0.9 % IV SOLN: INTRAVENOUS | Status: DC

## 2022-01-27 MED ORDER — POTASSIUM CHLORIDE IN NACL 20-0.9 MEQ/L-% IV SOLN
Freq: Once | INTRAVENOUS | Status: AC
  Filled 2022-01-27: qty 1000

## 2022-01-27 MED ORDER — ONDANSETRON HCL 4 MG PO TABS: 4.0000 mg | ORAL_TABLET | Freq: Four times a day (QID) | ORAL | Status: DC | PRN

## 2022-01-27 MED ORDER — MIRTAZAPINE 15 MG PO TABS: 15.0000 mg | ORAL_TABLET | Freq: Every evening | ORAL | Status: DC | PRN

## 2022-01-27 MED ORDER — VANCOMYCIN HCL 125 MG PO CAPS
125.0000 mg | ORAL_CAPSULE | Freq: Four times a day (QID) | ORAL | Status: DC
  Filled 2022-01-27: qty 1

## 2022-01-27 MED ORDER — ALBUTEROL SULFATE HFA 108 (90 BASE) MCG/ACT IN AERS: 2.0000 | INHALATION_SPRAY | Freq: Four times a day (QID) | RESPIRATORY_TRACT | Status: DC | PRN

## 2022-01-27 MED ORDER — VANCOMYCIN HCL 125 MG PO CAPS: 125.0000 mg | ORAL_CAPSULE | Freq: Every day | ORAL | Status: DC

## 2022-01-27 MED ORDER — DEXAMETHASONE SODIUM PHOSPHATE 10 MG/ML IJ SOLN: 10.0000 mg | Freq: Once | INTRAMUSCULAR | Status: DC

## 2022-01-27 MED ORDER — VANCOMYCIN 50 MG/ML ORAL SOLUTION
125.0000 mg | Freq: Four times a day (QID) | ORAL | Status: DC
  Administered 2022-01-28 (×3): 125 mg via ORAL
  Filled 2022-01-27 (×5): qty 2.5

## 2022-01-27 MED ORDER — APIXABAN 5 MG PO TABS
5.0000 mg | ORAL_TABLET | Freq: Two times a day (BID) | ORAL | Status: DC
  Administered 2022-01-28 – 2022-01-29 (×4): 5 mg via ORAL
  Filled 2022-01-27 (×4): qty 1

## 2022-01-27 MED ORDER — EZETIMIBE 10 MG PO TABS
10.0000 mg | ORAL_TABLET | Freq: Every day | ORAL | Status: DC
  Administered 2022-01-28 – 2022-01-29 (×2): 10 mg via ORAL
  Filled 2022-01-27 (×2): qty 1

## 2022-01-27 MED ORDER — MOMETASONE FURO-FORMOTEROL FUM 200-5 MCG/ACT IN AERO
2.0000 | INHALATION_SPRAY | Freq: Two times a day (BID) | RESPIRATORY_TRACT | Status: DC
  Administered 2022-01-28 – 2022-01-29 (×2): 2 via RESPIRATORY_TRACT
  Filled 2022-01-27: qty 8.8

## 2022-01-27 NOTE — Assessment & Plan Note (Signed)
Euvolemic to dry Will hold furosemide tonight while being hydrated and can resume as appropriate.  Monitor for fluid overload

## 2022-01-27 NOTE — Assessment & Plan Note (Signed)
Secondary to fluid losses from diarrhea Patient had recent AKI treated with IV fluids in the cancer center with resolution Continue to monitor renal function and monitor for electrolyte imbalance

## 2022-01-27 NOTE — ED Provider Notes (Signed)
[facility] Provider Note    Event Date/Time   First MD Initiated Contact with Patient 01/27/22 2112     (approximate)   History   +Cdiff   HPI  [name] is a 76 y.o. male history of CLL as well as CHF COPD presents to the ER for evaluation of several days of watery nonbloody nonmelanotic diarrheal illness.  Was at cancer center today was diagnosed with C. difficile toxin producing.  Was found to be hypokalemic which was repleted in clinic was given IV fluids but given his multiple episodes of persistent diarrhea immunocompromise state was sent to the ER for admission for antibiotics for treatment of C. difficile.  Denies any significant pain.     Physical Exam   Triage Vital Signs: ED Triage Vitals  Enc Vitals Group     BP 01/27/22 1850 112/67     Pulse Rate 01/27/22 1850 74     Resp 01/27/22 1850 16     Temp 01/27/22 1850 97.6 F (36.4 C)     Temp Source 01/27/22 1850 Oral     SpO2 01/27/22 1850 95 %     Weight --      Height --      Head Circumference --      Peak Flow --      Pain Score 01/27/22 1843 7     Pain Loc --      Pain Edu? --      Excl. in [place]? --     Most recent vital signs: Vitals:   01/27/22 1850  BP: 112/67  Pulse: 74  Resp: 16  Temp: 97.6 F (36.4 C)  SpO2: 95%     Constitutional: Alert  Eyes: Conjunctivae are normal.  Head: Atraumatic. Nose: No congestion/rhinnorhea. Mouth/Throat: Mucous membranes are moist.   Neck: Painless ROM.  Cardiovascular:   Good peripheral circulation. Respiratory: Normal respiratory effort.  No retractions.  Gastrointestinal: Soft and nontender.  Musculoskeletal:  no deformity Neurologic:  MAE spontaneously. No gross focal neurologic deficits are appreciated.  Skin:  Skin is warm, dry and intact. No rash noted. Psychiatric: Mood and affect are normal. Speech and behavior are normal.    ED Results / Procedures / Treatments   Labs (all labs ordered are listed, but only  abnormal results are displayed) Labs Reviewed  CBC WITH DIFFERENTIAL/PLATELET - Abnormal; Notable for the following components:      Result Value   RBC 3.55 (*)    Hemoglobin 11.0 (*)    HCT 34.8 (*)    RDW 16.4 (*)    All other components within normal limits  BASIC METABOLIC PANEL - Abnormal; Notable for the following components:   CO2 21 (*)    Glucose, Bld 160 (*)    Calcium 8.4 (*)    All other components within normal limits     EKG     RADIOLOGY Please see ED Course for my review and interpretation.  I personally reviewed all radiographic images ordered to evaluate for the above acute complaints and reviewed radiology reports and findings.  These findings were personally discussed with the patient.  Please see medical record for radiology report.    PROCEDURES:  Critical Care performed:   Procedures   MEDICATIONS ORDERED IN ED: Medications  0.9 %  sodium chloride infusion (has no administration in time range)  vancomycin (VANCOCIN) 50 mg/mL oral solution SOLN 125 mg (has no administration in time range)  vancomycin (VANCOCIN) capsule 125 mg (  has no administration in time range)    Followed by  vancomycin (VANCOCIN) capsule 125 mg (has no administration in time range)    Followed by  vancomycin (VANCOCIN) capsule 125 mg (has no administration in time range)    Followed by  vancomycin (VANCOCIN) capsule 125 mg (has no administration in time range)    Followed by  vancomycin (VANCOCIN) capsule 125 mg (has no administration in time range)     IMPRESSION / MDM / ASSESSMENT AND PLAN / ED COURSE  I reviewed the triage vital signs and the nursing notes.                              Differential diagnosis includes, but is not limited to, cdif, colitis, megacolon, dehydration, electrolyte abn  Patient presented to the ER for evaluation of symptoms as described above.  Patient with evidence of C. difficile he is hemodynamically stable afebrile.  We will give  additional IV hydration.  Given his complex past medical history Respiflo will admit per oncology recommendations for IV antibiotics in setting of C. difficile.  Hospitalist consulted for admission.   Clinical Course as of 01/27/22 2227  Mon Jan 27, 2022  2154 X-ray on my review and interpretation without evidence of free air.  No megacolon.  Will consult hospitalist for admission. [PR]    Clinical Course User Index [PR] Merlyn Lot, MD    FINAL CLINICAL IMPRESSION(S) / ED DIAGNOSES   Final diagnoses:  C. difficile colitis     Rx / DC Orders   ED Discharge Orders     None        Note:  This document was prepared using Dragon voice recognition software and may include unintentional dictation errors.    Merlyn Lot, MD 01/27/22 2227

## 2022-01-27 NOTE — Assessment & Plan Note (Signed)
Continue apixaban and amiodarone

## 2022-01-27 NOTE — Telephone Encounter (Signed)
Oral Chemotherapy Pharmacist Encounter   Received fax from [employer] assistance program notifying us that they had shipped [name]'s Calquence. Track #: [id number] ([employer]).  Per tracking his medication was delivered today 01/27/22. Patient has active GI issue and is seeing [facility] today. Merrily Pew, NP will inform [name] to hold on starting his Calquence.   Darl Pikes, PharmD, BCPS, BCOP, CPP Hematology/Oncology Clinical Pharmacist [place]/DB/AP [facility] [phone]  01/27/2022 2:05 PM

## 2022-01-27 NOTE — Progress Notes (Addendum)
Patient positive for C-diff. Patient agreeable to go to ER for HP admission. Patient has completed decadron 10 mg; zofran 8 mg; 0.9 % NaCl with KCl 20 mEq/ L infusion 500 mL/hr. Peripheral IV 01/27/22 22 G 1" Right Forearm-  IV saline locked by Benjie Karvonen, RN. [id number] Karvonen, RN to transport patient.

## 2022-01-27 NOTE — Assessment & Plan Note (Signed)
CPAP nightly

## 2022-01-27 NOTE — Assessment & Plan Note (Signed)
Not acutely exacerbated Continue home inhalers DuoNebs as needed 

## 2022-01-27 NOTE — Assessment & Plan Note (Signed)
Stable without complaints of chest pain Continue azithromycin M[place]in nitroglycerin

## 2022-01-27 NOTE — ED Provider Triage Note (Signed)
Emergency Medicine Provider Triage Evaluation Note  BART ASHFORD , a 76 y.o. male  was evaluated in triage.  Pt complains of N/V/D. Seen in oncology today for nausea and diarrhea for 3 days and 4 episodes of vomiting. Cant tolerate PO. Been off venetoclax for one week, not yet started calquence. +dif at cancer center. Cancer center wants him to be admitted for IVF.  Review of Systems  Positive: diarrhea Negative: fever  Physical Exam  There were no vitals taken for this visit. Gen:   Awake, no distress   Resp:  Normal effort  MSK:   Moves extremities without difficulty  Other:    Medical Decision Making  Medically screening exam initiated at 6:05 PM.  Appropriate orders placed.  ZACHAREY JENSEN was informed that the remainder of the evaluation will be completed by another provider, this initial triage assessment does not replace that evaluation, and the importance of remaining in the ED until their evaluation is complete.     Marquette Old, PA-C 01/27/22 1811

## 2022-01-27 NOTE — H&P (Signed)
History and Physical    Patient: [name] XBM:[id number] DOB: 09/15/1945 DOA: 01/27/2022 DOS: the patient was seen and examined on 01/27/2022 PCP: Leone Haven, MD  Patient coming from: Home  Chief Complaint:  Chief Complaint  Patient presents with   +Cdiff    HPI: [name] is a 76 y.o. male with medical history significant for HTN, HFpEF, A-fib on Eliquis, s/p PPM, CLL, COPD and OSA sent in by the cancer center for C. difficile diarrhea after presenting there with a several day history of watery nonbloody nonmelanotic diarrhea.  Work-up at the cancer center revealed hypokalemia and concern for acute kidney injury with creatinine of 1.3 up from a baseline of 0.81.  He was hydrated and given potassium repletion and sent for admission given his immunocompromise state.  Patient denies abdominal pain, fever or chills and has not been vomiting. ED course and data review: On arrival vitals initially within normal limits but he became hypotensive to 97/59 just prior to admission Labs creatinine corrected to 0.95, improved from 1.3 earlier in the day while at the cancer center.  Hemoglobin 11 which is not far from his baseline.  WBC normal.  Potassium normal at 3.6, improved with repletion given at the cancer center for potassium of 3.1 Abdominal x-ray shows no evidence of bowel obstruction or free air Patient started on IV hydration, oral vancomycin, hospitalist consulted for observation to ensure patient's ongoing stability prior to release home for continuation of therapy.     Past Medical History:  Diagnosis Date   Anxiety    Arthritis    Atrial fibrillation ([place])    a. Dx 2013, recurred 02/2014, CHA2DS2VASc = 3 -->placed on Eliquis;  b. 02/2014 Echo: EF 50-55%, mid and apical anterior septum and mid and apical inf septum are abnl, mild to mod Ao sclerosis w/o AS.   Cancer associated pain    Chicken pox    Chronic lymphocytic leukemia ([place])    a. Dx 02/2014.   CLL (chronic  lymphocytic leukemia) (HCC)    Complication of anesthesia    History of  PTSD--do not touch patient when waking up from surgery.   COPD (chronic obstructive pulmonary disease) (HCC)    Coronary artery disease    a. 04/2009 CABG x 3 (LIMA->LAD, VG->OM1, VG->PDA);  b. 09/2009 Cath: occluded VG x 2 w/ patent LIMA and L->R collats. EF 55%, mild antlat HK;  c. 10/2011 MV: EF 53%, no isch/infarct-->low risk.   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   TIA (transient ischemic attack) 11/02/2015   Past Surgical History:  Procedure Laterality Date   ABDOMINAL HERNIA REPAIR     APPENDECTOMY  06/21/1985   CARDIAC CATHETERIZATION  2010; 2011   ; Dr Fletcher Anon   CORONARY ARTERY BYPASS GRAFT  04/2009   "CABG X3"   EP IMPLANTABLE DEVICE N/A 03/03/2016   Procedure: Pacemaker Implant;  Surgeon: Deboraha Sprang, MD;  Location: [facility];  Service:  Cardiovascular;  Laterality: N/A;   FOREIGN BODY REMOVAL  1968   "shrapnel in my tailbone"   INGUINAL HERNIA REPAIR Right    INSERT / REPLACE / REMOVE PACEMAKER     JOINT REPLACEMENT Right 2018   LAPAROSCOPIC CHOLECYSTECTOMY     RIGHT/LEFT HEART CATH AND CORONARY/GRAFT ANGIOGRAPHY N/A 02/04/2021   Procedure: RIGHT/LEFT HEART CATH AND CORONARY/GRAFT ANGIOGRAPHY;  Surgeon: Nelva Bush, MD;  Location: [facility];  Service: Cardiovascular;  Laterality: N/A;   TONSILLECTOMY AND ADENOIDECTOMY  1956   TOTAL HIP ARTHROPLASTY Right 10/22/2016   Procedure: TOTAL HIP ARTHROPLASTY;  Surgeon: Dereck Leep, MD;   Location: [facility];  Service: Orthopedics;  Laterality: Right;   TOTAL HIP ARTHROPLASTY Left 11/04/2017   Procedure: TOTAL HIP ARTHROPLASTY;  Surgeon: Dereck Leep, MD;  Location: [facility];  Service: Orthopedics;  Laterality: Left;   Social History:  reports that he quit smoking about 15 years ago. His smoking use included cigarettes. He has a 40.00 pack-year smoking history. He has never used smokeless tobacco. He reports that he does not currently use alcohol. He reports that he does not use drugs.  No Known Allergies  Family History  Problem Relation Age of Onset   Heart disease Mother    Heart attack Mother    Coronary artery disease Other        family history    Prior to Admission medications   Medication Sig Start Date End Date Taking? Authorizing Provider  acalabrutinib maleate (CALQUENCE) 100 MG TABS Take 1 tablet by mouth 2 (two) times daily. Patient not taking: Reported on 01/27/2022 01/20/22   Cammie Sickle, MD  acetaminophen (TYLENOL) 325 MG tablet Take 1-2 tablets (325-650 mg total) by mouth every 4 (four) hours as needed for mild pain. 11/01/21   Setzer, Edman Circle, PA-C  acyclovir (ZOVIRAX) 400 MG tablet TAKE 1 TABLET(400 MG) BY MOUTH TWICE DAILY 08/26/21   Cammie Sickle, MD  albuterol (VENTOLIN HFA) 108 (90 Base) MCG/ACT inhaler Inhale 2 puffs into the lungs every 6 (six) hours as needed for wheezing or shortness of breath. 11/01/21   Setzer, Edman Circle, PA-C  Alirocumab (PRALUENT) 150 MG/ML SOAJ Inject 150 mg into the skin every 14 (fourteen) days. 03/26/21   [name], MD  amiodarone (PACERONE) 200 MG tablet Take 1/2 tablet (100 mg) by mouth twice daily 5 days a week 03/13/21   Deboraha Sprang, MD  apixaban (ELIQUIS) 5 MG TABS tablet Take 5 mg by mouth 2 (two) times daily.    [provider]  azelastine (ASTELIN) 0.1 % nasal spray Place 2 sprays into both nostrils 2 (two) times daily. Use in each nostril as directed 11/01/21   Setzer, Edman Circle, PA-C   budesonide-formoterol (SYMBICORT) 160-4.5 MCG/ACT inhaler Inhale 2 puffs into the lungs 2 (two) times daily. 12/01/16   Wilhelmina Mcardle, MD  cetirizine (ZYRTEC) 10 MG tablet Take 10 mg by mouth daily as needed for allergies.     [provider]  Coenzyme Q10 (COQ10) 200 MG CAPS Take 200 mg by mouth daily.    [provider]  diphenoxylate-atropine (LOMOTIL) 2.5-0.025 MG tablet Take 1 tablet by mouth 4 (four) times daily as needed for diarrhea or loose stools. 01/27/22   Borders, Kirt Boys, NP  ezetimibe (ZETIA) 10 MG tablet Take 1 tablet (10 mg total) by mouth daily. 01/06/17 11/01/27  [name], MD  furosemide (LASIX) 40 MG tablet Take 1 tablet (40 mg total) by  mouth daily. 11/02/21   Setzer, Edman Circle, PA-C  guaiFENesin-dextromethorphan (ROBITUSSIN DM) 100-10 MG/5ML syrup Take 5 mLs by mouth every 4 (four) hours as needed for cough. Patient not taking: Reported on 01/27/2022 09/06/21   Val Riles, MD  Javier Docker Oil 350 MG CAPS Take 350 mg by mouth daily as needed (Heart).    [provider]  loperamide (IMODIUM) 2 MG capsule Take 2 mg by mouth as needed for diarrhea or loose stools.    [provider]  mirtazapine (REMERON) 15 MG tablet Take 15 mg by mouth at bedtime as needed (for panic associated with PTSD).     [provider]  Multiple Vitamin (MULTIVITAMIN WITH MINERALS) TABS tablet Take 1 tablet by mouth daily.    [provider]  nitroGLYCERIN (NITROSTAT) 0.4 MG SL tablet Place 1 tablet (0.4 mg total) under the tongue every 5 (five) minutes as needed for chest pain. Patient not taking: Reported on 01/27/2022 01/06/17   [name], MD  ondansetron (ZOFRAN) 8 MG tablet Take 1 tablet (8 mg total) by mouth every 8 (eight) hours as needed for nausea or vomiting. 01/27/22   Borders, Kirt Boys, NP  prochlorperazine (COMPAZINE) 10 MG tablet Take 1 tablet (10 mg total) by mouth every 6 (six) hours as needed for nausea or vomiting. 01/27/22    Borders, Kirt Boys, NP  Tiotropium Bromide Monohydrate 2.5 MCG/ACT AERS Inhale 2 puffs into the lungs at bedtime. Spiriva 08/07/20   [provider]  traMADol (ULTRAM) 50 MG tablet Take 1 tablet (50 mg total) by mouth every 12 (twelve) hours as needed. 01/03/22   McLean-Scocuzza, Nino Glow, MD    Physical Exam: Vitals:   01/27/22 1850 01/27/22 2236  BP: 112/67 (!) 97/59  Pulse: 74 70  Resp: 16 15  Temp: 97.6 F (36.4 C) (!) 97.4 F (36.3 C)  TempSrc: Oral Oral  SpO2: 95% 96%   Physical Exam  Labs on Admission: I have personally reviewed following labs and imaging studies  CBC: Recent Labs  Lab 01/27/22 1307 01/27/22 1846  WBC 12.6* 8.2  NEUTROABS 6.6 6.4  HGB 12.6* 11.0*  HCT 38.8* 34.8*  MCV 98.2 98.0  PLT 210 010   Basic Metabolic Panel: Recent Labs  Lab 01/27/22 1307 01/27/22 1846  NA 135 137  K 3.1* 3.6  CL 105 108  CO2 21* 21*  GLUCOSE 138* 160*  BUN 23 21  CREATININE 1.30* 0.95  CALCIUM 8.7* 8.4*  MG 2.0  --    GFR: Estimated Creatinine Clearance: 74.7 mL/min (by C-G formula based on SCr of 0.95 mg/dL). Liver Function Tests: Recent Labs  Lab 01/27/22 1307  AST 41  ALT 34  ALKPHOS 128*  BILITOT 1.1  PROT 7.7  ALBUMIN 3.8   No results for input(s): "LIPASE", "AMYLASE" in the last 168 hours. No results for input(s): "AMMONIA" in the last 168 hours. Coagulation Profile: No results for input(s): "INR", "PROTIME" in the last 168 hours. Cardiac Enzymes: No results for input(s): "CKTOTAL", "CKMB", "CKMBINDEX", "TROPONINI" in the last 168 hours. BNP (last 3 results) No results for input(s): "PROBNP" in the last 8760 hours. HbA1C: No results for input(s): "HGBA1C" in the last 72 hours. CBG: No results for input(s): "GLUCAP" in the last 168 hours. Lipid Profile: No results for input(s): "CHOL", "HDL", "LDLCALC", "TRIG", "CHOLHDL", "LDLDIRECT" in the last 72 hours. Thyroid Function Tests: No results for input(s): "TSH", "T4TOTAL", "FREET4",  "T3FREE", "THYROIDAB" in the last 72 hours. Anemia Panel: No results for  input(s): "VITAMINB12", "FOLATE", "FERRITIN", "TIBC", "IRON", "RETICCTPCT" in the last 72 hours. Urine analysis:    Component Value Date/Time   COLORURINE STRAW (A) 09/13/2021 1206   APPEARANCEUR CLEAR (A) 09/13/2021 1206   APPEARANCEUR Clear 10/11/2014 2003   LABSPEC 1.009 09/13/2021 1206   LABSPEC 1.017 10/11/2014 2003   PHURINE 7.0 09/13/2021 1206   GLUCOSEU NEGATIVE 09/13/2021 1206   GLUCOSEU Negative 10/11/2014 2003   HGBUR NEGATIVE 09/13/2021 1206   BILIRUBINUR NEGATIVE 09/13/2021 1206   BILIRUBINUR Negative 10/11/2014 2003   KETONESUR NEGATIVE 09/13/2021 1206   PROTEINUR NEGATIVE 09/13/2021 1206   UROBILINOGEN 1.0 05/12/2009 1426   NITRITE NEGATIVE 09/13/2021 1206   LEUKOCYTESUR NEGATIVE 09/13/2021 1206   LEUKOCYTESUR Negative 10/11/2014 2003    Radiological Exams on Admission: DG Abdomen 1 View  Result Date: 01/27/2022 CLINICAL DATA:  cdif eval for megacolon EXAM: ABDOMEN - 1 VIEW COMPARISON:  CT 12/12/2021 FINDINGS: Gas within mildly prominent transverse colon. No evidence of megacolon. No bowel obstruction or free air. No organomegaly. IMPRESSION: No evidence of bowel obstruction or free air. Electronically Signed   By: Rolm Baptise M.D.   On: 01/27/2022 22:10     Data Reviewed: Relevant notes from primary care and specialist visits, past discharge summaries as available in EHR, including Care Everywhere. Prior diagnostic testing as pertinent to current admission diagnoses Updated medications and problem lists for reconciliation ED course, including vitals, labs, imaging, treatment and response to treatment Triage notes, nursing and pharmacy notes and ED provider's notes Notable results as noted in HPI   Assessment and Plan: * Clostridioides difficile diarrhea Vancomycin and IV fluids.  Clear liquid diet for now Monitor electrolytes if ongoing diarrhea  Hypotension Secondary to fluid  losses from diarrhea Patient had recent AKI treated with IV fluids in the cancer center with resolution Continue to monitor renal function and monitor for electrolyte imbalance  CAD (coronary artery disease) Stable without complaints of chest pain Continue azithromycin M[place]in nitroglycerin  Chronic diastolic CHF (congestive heart failure) (HCC) Euvolemic to dry Will hold furosemide tonight while being hydrated and can resume as appropriate.  Monitor for fluid overload  Paroxysmal atrial fibrillation (HCC) Continue apixaban and amiodarone  CLL (chronic lymphocytic leukemia) ([place]) Monitored by the cancer center.  Was seen on 7/10  OSA on CPAP CPAP nightly  COPD (chronic obstructive pulmonary disease) (HCC) Not acutely exacerbated Continue home inhalers DuoNebs as needed        DVT prophylaxis: Eliquis  Consults: none  Advance Care Planning:   Code Status: Prior   Family Communication: none  Disposition Plan: Back to previous home environment  Severity of Illness: The appropriate patient status for this patient is OBSERVATION. Observation status is judged to be reasonable and necessary in order to provide the required intensity of service to ensure the patient's safety. The patient's presenting symptoms, physical exam findings, and initial radiographic and laboratory data in the context of their medical condition is felt to place them at decreased risk for further clinical deterioration. Furthermore, it is anticipated that the patient will be medically stable for discharge from the hospital within 2 midnights of admission.   Author: Athena Masse, MD 01/27/2022 11:11 PM  For on call review [email or web address].

## 2022-01-27 NOTE — ED Triage Notes (Signed)
Pt comes with c/o +cidff from cancer center. Pt was sent here for fluids and admittance.

## 2022-01-27 NOTE — Assessment & Plan Note (Signed)
Monitored by the cancer center.  Was seen on 7/10

## 2022-01-27 NOTE — Assessment & Plan Note (Addendum)
Vancomycin and IV fluids.  Clear liquid diet for now Monitor electrolytes if ongoing diarrhea

## 2022-01-27 NOTE — Progress Notes (Addendum)
Symptom Management Clinic [facility] at [facility] Telephone:([phone] Fax:([phone]  Patient Care Team: Glori Luis, MD as PCP - General (Family Medicine) Antonieta Iba, MD as PCP - Cardiology (Cardiology) Antonieta Iba, MD as Consulting Physician (Cardiology) Earna Coder, MD as Medical Oncologist (Hematology and Oncology) Keitha Butte, RN as Registered Nurse (Oncology)   Name of the patient: [name]  [id number]  Sep 21, 1945   Date of visit: 01/27/22  Reason for Consult: [name] is a 76 y.o. 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   Patient saw Dr. Donneta Romberg on 01/20/2022 with CT revealing further disease progression with worsening lymphadenopathy in the neck and chest.  Venetoclax is discontinued.  Patient was started on acalabrutinib.  Patient was seen in [facility] today for several days of nausea, vomiting, and diarrhea.  Patient describes nausea and persistent diarrhea over the past 3 days.  He has had 4 episodes of vomiting over that time period.  Denies fever.  No pain.  No changes in shortness of breath, cough, or congestion.  No urinary symptoms.  He has been unable to tolerate much oral intake.  Describes diarrhea as watery and episodes too numerous to count.  Denies bleeding.   PAST MEDICAL HISTORY: Past Medical History:  Diagnosis Date   Anxiety    Arthritis    Atrial fibrillation (HCC)    a. Dx 2013, recurred 02/2014, CHA2DS2VASc = 3 -->placed on Eliquis;  b. 02/2014 Echo: EF 50-55%, mid and apical anterior septum and mid and apical inf septum are abnl, mild to mod Ao sclerosis w/o AS.   Cancer associated pain    Chicken pox    Chronic lymphocytic leukemia (HCC)    a. Dx 02/2014.   CLL (chronic lymphocytic leukemia) (HCC)    Complication of anesthesia    History of   PTSD--do not touch patient when waking up from surgery.   COPD (chronic obstructive pulmonary disease) (HCC)    Coronary artery disease    a. 04/2009 CABG x 3 (LIMA->LAD, VG->OM1, VG->PDA);  b. 09/2009 Cath: occluded VG x 2 w/ patent LIMA and L->R collats. EF 55%, mild antlat HK;  c. 10/2011 MV: EF 53%, no isch/infarct-->low risk.   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   TIA (transient ischemic attack) 11/02/2015    PAST SURGICAL HISTORY:  Past Surgical History:  Procedure Laterality Date   ABDOMINAL HERNIA REPAIR     APPENDECTOMY  06/21/1985   CARDIAC CATHETERIZATION  2010; 2011   ; Dr Kirke Corin   CORONARY ARTERY BYPASS GRAFT  04/2009   "CABG X3"   EP IMPLANTABLE DEVICE N/A 03/03/2016   Procedure: Pacemaker Implant;  Surgeon: Duke Salvia, MD;  Location: [facility] INVASIVE CV LAB;  Service: Cardiovascular;  Laterality: N/A;   FOREIGN BODY REMOVAL  1968   "shrapnel in  my tailbone"   INGUINAL HERNIA REPAIR Right    INSERT / REPLACE / REMOVE PACEMAKER     JOINT REPLACEMENT Right 2018   LAPAROSCOPIC CHOLECYSTECTOMY     RIGHT/LEFT HEART CATH AND CORONARY/GRAFT ANGIOGRAPHY N/A 02/04/2021   Procedure: RIGHT/LEFT HEART CATH AND CORONARY/GRAFT ANGIOGRAPHY;  Surgeon: Nelva Bush, MD;  Location: [place] CV LAB;  Service: Cardiovascular;  Laterality: N/A;   TONSILLECTOMY AND ADENOIDECTOMY  1956   TOTAL HIP ARTHROPLASTY Right 10/22/2016   Procedure: TOTAL HIP ARTHROPLASTY;  Surgeon: Dereck Leep, MD;  Location: [facility] ORS;  Service: Orthopedics;   Laterality: Right;   TOTAL HIP ARTHROPLASTY Left 11/04/2017   Procedure: TOTAL HIP ARTHROPLASTY;  Surgeon: Dereck Leep, MD;  Location: [facility] ORS;  Service: Orthopedics;  Laterality: Left;    HEMATOLOGY/ONCOLOGY HISTORY:  Oncology History Overview Note  # AUG 2015- SLL/CLL [Right Ax Ln Bx] s/p Benda-Rituxan x6 [finished March 2016]; Maintenance Rituxan q 73m [started April 2016; Dr.Pandit];Last Ritux Jan 2017.  MARCH 2017- CT N/C/A/P- NED. STOP Ritux; surveillance   # AUG 2019- CT/PET- progression/NO transformation;  # NOV 2019- Progression; started Ibrutinib 420 mg/d. STOPPED in end of feb sec to extreme fatigue/joint pains/cramps  #November 01, 2018-start ibrutinib 280 mg a day; December 20, 2018-discontinue ibrutinib secondary multiple side effects.   #January 03, 2019 start Gazyva + Ven [July]; finished Dec 2020-; started Lawson Fiscal maintenance [200 mg a day; DI-amiodarone]; SEP 2022- congestive heart failure; s/p cardiac cath-noted to have CAD however no stents placed.  2D echo- EF-55%  #Mid March 2021-venetoclax dose reduced to 100 mg [second diarrhea]. HELD in [place] 2023 x3 months-multiple admissions to the hospital/pneumonia infection stroke x 3 m.   # MAY 2023-progression of disease in the neck ches; Nov 18, 2021-RE-started venetoclax;   # JUNE 29th, 2023-progression of disease in the neck- STOP VENAOCLAX;   # JULY 2nd week-START ACALABRUTINIB 100 mg BID [previous intolerance to ibrutinib]    #? 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; IGVH- UN-MUTATED [poor prognosis]  SURVIVORSHIP: p  DIAGNOSIS: CLL   STAGE: IV  ;GOALS: control  CURRENT/MOST RECENT THERAPY : venetoclax      CLL (chronic lymphocytic leukemia) ([place])  01/03/2019 -  Chemotherapy   The patient had obinutuzumab (GAZYVA) 100 mg in sodium chloride 0.9 % 100 mL (0.9615 mg/mL) chemo infusion, 100 mg, Intravenous, Once, 6 of 6 cycles Administration: 100 mg (01/03/2019), 900 mg (01/04/2019), 1,000 mg (01/10/2019), 1,000 mg (01/31/2019), 1,000 mg (01/17/2019), 1,000 mg (02/28/2019), 1,000 mg (04/07/2019), 1,000 mg (05/05/2019), 1,000 mg (06/02/2019)  for chemotherapy treatment.      ALLERGIES:  has No Known Allergies.  MEDICATIONS:  Current Outpatient Medications  Medication Sig Dispense Refill   acetaminophen (TYLENOL) 325 MG tablet Take 1-2 tablets (325-650 mg total) by mouth every 4 (four) hours as needed for mild pain.     acyclovir (ZOVIRAX) 400 MG tablet TAKE 1 TABLET(400 MG) BY MOUTH TWICE DAILY 60 tablet 6   albuterol (VENTOLIN HFA) 108 (90 Base) MCG/ACT inhaler Inhale 2 puffs into the lungs every 6 (six) hours as needed for wheezing or shortness of breath. 1 each 0   Alirocumab (PRALUENT) 150 MG/ML SOAJ Inject 150 mg into the skin every 14 (fourteen)  days. 2 mL 12   amiodarone (PACERONE) 200 MG tablet Take 1/2 tablet (100 mg) by mouth twice daily 5 days a week 60 tablet 6   apixaban (ELIQUIS) 5 MG TABS tablet Take 5 mg by mouth 2 (two) times daily.     azelastine (ASTELIN) 0.1 % nasal spray Place 2 sprays into both nostrils 2 (two) times daily. Use in each nostril as directed 30 mL 1   budesonide-formoterol (SYMBICORT) 160-4.5 MCG/ACT inhaler Inhale 2 puffs into the lungs 2 (two) times daily. 1 Inhaler 11   cetirizine (ZYRTEC) 10 MG tablet Take 10 mg by mouth daily as needed for allergies.      Coenzyme Q10 (COQ10) 200 MG CAPS Take 200 mg by mouth daily.     ezetimibe (ZETIA) 10 MG tablet Take 1 tablet (10 mg total) by mouth daily. 90 tablet 3   furosemide (LASIX) 40 MG tablet Take 1 tablet (40 mg total) by mouth daily. 30 tablet 0   Krill Oil 350 MG CAPS Take 350 mg by mouth daily as needed (Heart).      loperamide (IMODIUM) 2 MG capsule Take 2 mg by mouth as needed for diarrhea or loose stools.     mirtazapine (REMERON) 15 MG tablet Take 15 mg by mouth at bedtime as needed (for panic associated with PTSD).      Multiple Vitamin (MULTIVITAMIN WITH MINERALS) TABS tablet Take 1 tablet by mouth daily.     Tiotropium Bromide Monohydrate 2.5 MCG/ACT AERS Inhale 2 puffs into the lungs at bedtime. Spiriva     traMADol (ULTRAM) 50 MG tablet Take 1 tablet (50 mg total) by mouth every 12 (twelve) hours as needed. 60 tablet 0   acalabrutinib maleate (CALQUENCE) 100 MG TABS Take 1 tablet by mouth 2 (two) times daily. (Patient not taking: Reported on 01/27/2022) 60 tablet 1   guaiFENesin-dextromethorphan (ROBITUSSIN DM) 100-10 MG/5ML syrup Take 5 mLs by mouth every 4 (four) hours as needed for cough. (Patient not taking: Reported on 01/27/2022) 118 mL 0   nitroGLYCERIN (NITROSTAT) 0.4 MG SL tablet Place 1 tablet (0.4 mg total) under the tongue every 5 (five) minutes as needed for chest pain. (Patient not taking: Reported on 01/27/2022) 25 tablet 6   No current facility-administered medications for this visit.   Facility-Administered Medications Ordered in Other Visits  Medication Dose Route Frequency Provider Last Rate Last Admin   0.9 %  sodium chloride infusion   Intravenous Continuous Briasia Flinders, Kirt Boys, NP       ondansetron (ZOFRAN) injection 4 mg  4 mg Intravenous Once Anaia Frith, Kirt Boys, NP        VITAL SIGNS: BP 117/73   Pulse 80   Temp (!) 97.3 F (36.3 C) (Tympanic)   Resp (!) 22   SpO2 100%  There were no vitals filed for this visit.  Estimated body mass index is 30.42 kg/m as calculated from the following:   Height as of 01/20/22: $RemoveBe'5\' 9"'jZGxvPrRq$  (1.753 m).   Weight as of 01/20/22: 206 lb (93.4 kg).  LABS: CBC:    Component Value Date/Time   WBC 12.6 (H) 01/27/2022 1307   HGB 12.6 (L) 01/27/2022 1307   HGB 12.5 (L) 03/11/2021 1136   HCT 38.8 (L) 01/27/2022 1307   HCT 36.9 (L) 03/11/2021 1136    PLT 210 01/27/2022 1307   PLT 159 03/11/2021 1136   MCV 98.2 01/27/2022 1307   MCV 100 (H) 03/11/2021 1136   MCV 95 10/24/2014 0837   NEUTROABS 6.6  01/27/2022 1307   NEUTROABS 3.1 10/24/2014 0837   LYMPHSABS 4.7 (H) 01/27/2022 1307   LYMPHSABS 0.8 (L) 10/24/2014 0837   MONOABS 1.1 (H) 01/27/2022 1307   MONOABS 0.6 10/24/2014 0837   EOSABS 0.1 01/27/2022 1307   EOSABS 0.2 10/24/2014 0837   BASOSABS 0.1 01/27/2022 1307   BASOSABS 0.0 10/24/2014 0837   Comprehensive Metabolic Panel:    Component Value Date/Time   NA 135 01/27/2022 1307   NA 143 03/11/2021 1136   NA 139 10/11/2014 1800   K 3.1 (L) 01/27/2022 1307   K 3.3 (L) 10/11/2014 1800   CL 105 01/27/2022 1307   CL 106 10/11/2014 1800   CO2 21 (L) 01/27/2022 1307   CO2 27 10/11/2014 1800   BUN 23 01/27/2022 1307   BUN 13 03/11/2021 1136   BUN 15 10/11/2014 1800   CREATININE 1.30 (H) 01/27/2022 1307   CREATININE 0.89 10/11/2014 1800   GLUCOSE 138 (H) 01/27/2022 1307   GLUCOSE 107 (H) 10/11/2014 1800   CALCIUM 8.7 (L) 01/27/2022 1307   CALCIUM 8.8 (L) 10/11/2014 1800   AST 41 01/27/2022 1307   AST 23 10/11/2014 1800   ALT 34 01/27/2022 1307   ALT 22 10/11/2014 1800   ALKPHOS 128 (H) 01/27/2022 1307   ALKPHOS 61 10/11/2014 1800   BILITOT 1.1 01/27/2022 1307   BILITOT 0.7 05/18/2017 1048   BILITOT 0.9 10/11/2014 1800   PROT 7.7 01/27/2022 1307   PROT 6.7 05/18/2017 1048   PROT 6.4 (L) 10/11/2014 1800   ALBUMIN 3.8 01/27/2022 1307   ALBUMIN 4.3 05/18/2017 1048   ALBUMIN 4.1 10/11/2014 1800    RADIOGRAPHIC STUDIES: CT Soft Tissue Neck W Contrast  Result Date: 01/16/2022 CLINICAL DATA:  Occult malignancy lymphadenopathy and dysphagia h/o CLL EXAM: CT NECK WITH CONTRAST TECHNIQUE: Multidetector CT imaging of the neck was performed using the standard protocol following the bolus administration of intravenous contrast. RADIATION DOSE REDUCTION: This exam was performed according to the departmental dose-optimization  program which includes automated exposure control, adjustment of the mA and/or kV according to patient size and/or use of iterative reconstruction technique. CONTRAST:  3mL OMNIPAQUE IOHEXOL 300 MG/ML  SOLN COMPARISON:  12/12/2021 cough FINDINGS: Pharynx and larynx: Unremarkable.  No mass or swelling. Salivary glands: Parotid and submandibular glands are unremarkable. Thyroid: Normal. Lymph nodes: Nodes on the prior study are increased in size. For example, right level 5 node measures 1.2 cm on series 2, image 111 (previously 0.9 cm). Left level 5 node measures 1.1 cm on image 91 (previously 0.7 cm). Left level 2 node measures 1 cm on image 86 (previously 0.5 cm). Vascular: Major neck vessels are patent. Calcified plaque at the common carotid bifurcations. Limited intracranial: No abnormal enhancement. Visualized orbits: Unremarkable. Mastoids and visualized paranasal sinuses: Mastoid air cells are clear. Polypoid paranasal sinus mucosal thickening. Skeleton: Advanced degenerative changes of the cervical spine similar to the prior study. Upper chest: Increased adenopathy.  No apical lung mass. Other: None. IMPRESSION: Further progression of adenopathy since 12/12/2021 in the neck and included chest. Electronically Signed   By: Macy Mis M.D.   On: 01/16/2022 14:01    PERFORMANCE STATUS (ECOG) : 3 - Symptomatic, >50% confined to bed  Review of Systems Unless otherwise noted, a complete review of systems is negative.  Physical Exam General: NAD Cardiovascular: regular rate and rhythm Pulmonary: clear ant fields Abdomen: soft, nontender, + bowel sounds GU: no suprapubic tenderness Extremities: no edema, no joint deformities Skin: no rashes Neurological: Weakness  but otherwise nonfocal  Assessment and Plan- Patient is a 76 y.o. 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     Nausea/vomiting/diarrhea -unclear etiology.  Patient has been off venetoclax for about a week.  He has not yet started the [place].  He has not been taking anything for nausea or vomiting at home.  He has been taking Imodium but has not found that to be effective.  Vitals stable.  Labs show AKI, mild leukocytosis and hypokalemia.  Normal magnesium.  Clinically dehydrated.  Will replete K and give IV fluids/antiemetics.  Will send for stool studies/C. difficile PCR.  Patient is quite weak and I discussed option of transferring to the ER but patient quickly and was adamantly in opposition to the idea.  Wife says that she would take him to the ER if his condition worsens.  Otherwise, we will plan to see him daily for the next several days to provide in-clinic supportive care.  Hold Calquence. Antidiarrheals if stool studies negative.   Case and plan discussed with Dr. Rogue Bussing   Patient expressed understanding and was in agreement with this plan. He also understands that He can call clinic at any time with any questions, concerns, or complaints.   Thank you for allowing me to participate in the care of this very pleasant patient.   Time Total: 25 minutes  Visit consisted of counseling and education dealing with the complex and emotionally intense issues of symptom management in the setting of serious illness.Greater than 50%  of this time was spent counseling and coordinating care related to the above assessment and plan.  Signed by: Altha Harm, PhD, NP-C

## 2022-01-27 NOTE — Telephone Encounter (Signed)
Nurse returned call to patients wife after receiving message from triage line. Patients wife reports nausea, vomiting x 3 days. Nausea has improved but diarrhea persists. Patient is weak and has very little PO intake over the weekend. Patients wife reports immodium has not helped. Secure chat message sent to MD and [facility] teams. Beckey Rutter, NP to see patient with labs today.

## 2022-01-28 ENCOUNTER — Telehealth (HOSPITAL_COMMUNITY): Payer: Self-pay | Admitting: Pharmacy Technician

## 2022-01-28 ENCOUNTER — Ambulatory Visit: Payer: Medicare HMO

## 2022-01-28 ENCOUNTER — Telehealth: Payer: Self-pay

## 2022-01-28 ENCOUNTER — Inpatient Hospital Stay: Payer: Medicare HMO

## 2022-01-28 ENCOUNTER — Other Ambulatory Visit (HOSPITAL_COMMUNITY): Payer: Self-pay

## 2022-01-28 ENCOUNTER — Encounter: Payer: Self-pay | Admitting: Internal Medicine

## 2022-01-28 DIAGNOSIS — Z9221 Personal history of antineoplastic chemotherapy: Secondary | ICD-10-CM | POA: Diagnosis not present

## 2022-01-28 DIAGNOSIS — Z7901 Long term (current) use of anticoagulants: Secondary | ICD-10-CM | POA: Diagnosis not present

## 2022-01-28 DIAGNOSIS — Z8249 Family history of ischemic heart disease and other diseases of the circulatory system: Secondary | ICD-10-CM | POA: Diagnosis not present

## 2022-01-28 DIAGNOSIS — Z96643 Presence of artificial hip joint, bilateral: Secondary | ICD-10-CM | POA: Diagnosis present

## 2022-01-28 DIAGNOSIS — I2510 Atherosclerotic heart disease of native coronary artery without angina pectoris: Secondary | ICD-10-CM | POA: Diagnosis present

## 2022-01-28 DIAGNOSIS — N179 Acute kidney failure, unspecified: Secondary | ICD-10-CM | POA: Diagnosis present

## 2022-01-28 DIAGNOSIS — I959 Hypotension, unspecified: Secondary | ICD-10-CM | POA: Diagnosis present

## 2022-01-28 DIAGNOSIS — Z87891 Personal history of nicotine dependence: Secondary | ICD-10-CM | POA: Diagnosis not present

## 2022-01-28 DIAGNOSIS — E7800 Pure hypercholesterolemia, unspecified: Secondary | ICD-10-CM | POA: Diagnosis present

## 2022-01-28 DIAGNOSIS — Z79899 Other long term (current) drug therapy: Secondary | ICD-10-CM | POA: Diagnosis not present

## 2022-01-28 DIAGNOSIS — Z8673 Personal history of transient ischemic attack (TIA), and cerebral infarction without residual deficits: Secondary | ICD-10-CM | POA: Diagnosis not present

## 2022-01-28 DIAGNOSIS — K219 Gastro-esophageal reflux disease without esophagitis: Secondary | ICD-10-CM | POA: Diagnosis present

## 2022-01-28 DIAGNOSIS — Z950 Presence of cardiac pacemaker: Secondary | ICD-10-CM | POA: Diagnosis not present

## 2022-01-28 DIAGNOSIS — J449 Chronic obstructive pulmonary disease, unspecified: Secondary | ICD-10-CM | POA: Diagnosis present

## 2022-01-28 DIAGNOSIS — I5032 Chronic diastolic (congestive) heart failure: Secondary | ICD-10-CM | POA: Diagnosis present

## 2022-01-28 DIAGNOSIS — F4310 Post-traumatic stress disorder, unspecified: Secondary | ICD-10-CM | POA: Diagnosis present

## 2022-01-28 DIAGNOSIS — A0472 Enterocolitis due to Clostridium difficile, not specified as recurrent: Secondary | ICD-10-CM | POA: Diagnosis present

## 2022-01-28 DIAGNOSIS — I252 Old myocardial infarction: Secondary | ICD-10-CM | POA: Diagnosis not present

## 2022-01-28 DIAGNOSIS — I480 Paroxysmal atrial fibrillation: Secondary | ICD-10-CM | POA: Diagnosis present

## 2022-01-28 DIAGNOSIS — E860 Dehydration: Secondary | ICD-10-CM | POA: Diagnosis present

## 2022-01-28 DIAGNOSIS — G4733 Obstructive sleep apnea (adult) (pediatric): Secondary | ICD-10-CM | POA: Diagnosis present

## 2022-01-28 DIAGNOSIS — I110 Hypertensive heart disease with heart failure: Secondary | ICD-10-CM | POA: Diagnosis present

## 2022-01-28 DIAGNOSIS — C9110 Chronic lymphocytic leukemia of B-cell type not having achieved remission: Secondary | ICD-10-CM | POA: Diagnosis present

## 2022-01-28 DIAGNOSIS — Z951 Presence of aortocoronary bypass graft: Secondary | ICD-10-CM | POA: Diagnosis not present

## 2022-01-28 LAB — BASIC METABOLIC PANEL
Anion gap: 4 — ABNORMAL LOW (ref 5–15)
BUN: 19 mg/dL (ref 8–23)
CO2: 22 mmol/L (ref 22–32)
Calcium: 8.3 mg/dL — ABNORMAL LOW (ref 8.9–10.3)
Chloride: 112 mmol/L — ABNORMAL HIGH (ref 98–111)
Creatinine, Ser: 1.01 mg/dL (ref 0.61–1.24)
GFR, Estimated: >60 mL/min (ref >60)
Glucose, Bld: 180 mg/dL — ABNORMAL HIGH (ref 70–99)
Potassium: 3.9 mmol/L (ref 3.5–5.1)
Sodium: 138 mmol/L (ref 135–145)

## 2022-01-28 MED ORDER — ACYCLOVIR 200 MG PO CAPS
400.0000 mg | ORAL_CAPSULE | Freq: Two times a day (BID) | ORAL | Status: DC
Start: 2022-01-28 — End: 2022-01-29
  Administered 2022-01-28 – 2022-01-29 (×3): 400 mg via ORAL
  Filled 2022-01-28 (×4): qty 2

## 2022-01-28 MED ORDER — ALBUTEROL SULFATE (2.5 MG/3ML) 0.083% IN NEBU: 2.5000 mg | INHALATION_SOLUTION | Freq: Four times a day (QID) | RESPIRATORY_TRACT | Status: DC | PRN

## 2022-01-28 MED ORDER — VANCOMYCIN HCL 125 MG PO CAPS
125.0000 mg | ORAL_CAPSULE | Freq: Four times a day (QID) | ORAL | Status: DC
  Administered 2022-01-28 – 2022-01-29 (×4): 125 mg via ORAL
  Filled 2022-01-28 (×7): qty 1

## 2022-01-28 NOTE — TOC Initial Note (Signed)
Transition of Care [facility]) - Initial/Assessment Note    Patient Details  Name: [name] MRN: [id number] Date of Birth: 04/23/1946  Transition of Care [facility]) CM/SW Contact:    Pete Pelt, RN Phone Number: 01/28/2022, 2:30 PM  Clinical Narrative:     Patient lives at home with wife and granddaughter, both of whom can provide assistance when/if needed.  He states they can transport to appointments and pick up medications as well if needed.  Patient is current with all appointments, and has no concerns about taking medications as prescribed.  Patient states he has walker, bsc and all needed DME at home.  Although he has had home health in the past, he reports that he is not currently receiving home health.  At this time, patient states he does not feel he has needs from [facility].  TOC contact information provided if needs change.                    Patient Goals and CMS Choice        Expected Discharge Plan and Services     Discharge Planning Services: CM Consult   Living arrangements for the past 2 months: [place]                                      Prior Living Arrangements/Services Living arrangements for the past 2 months: Single Family Home Lives with:: Self, Spouse, Other (Comment) (Granddaughter) Patient language and need for interpreter reviewed:: Yes (No interpreter required) Do you feel safe going back to the place where you live?: Yes      Need for Family Participation in Patient Care: Yes (Comment) Care giver support system in place?: Yes (comment) Current home services: DME Criminal Activity/Legal Involvement Pertinent to Current Situation/Hospitalization: No - Comment as needed  Activities of Daily Living Home Assistive Devices/Equipment: Cane (specify quad or straight), Walker (specify type) ADL Screening (condition at time of admission) Patient's cognitive ability adequate to safely complete daily activities?: Yes Is the patient deaf  or have difficulty hearing?: Yes Does the patient have difficulty seeing, even when wearing glasses/contacts?: No Does the patient have difficulty concentrating, remembering, or making decisions?: No Patient able to express need for assistance with ADLs?: Yes Does the patient have difficulty dressing or bathing?: No Independently performs ADLs?: Yes (appropriate for developmental age) Does the patient have difficulty walking or climbing stairs?: No Weakness of Legs: Both Weakness of Arms/Hands: None  Permission Sought/Granted Permission sought to share information with : Case Manager Permission granted to share information with : Yes, Verbal Permission Granted              Emotional Assessment Appearance:: Appears stated age     Orientation: : Oriented to Self, Oriented to Place, Oriented to  Time, Oriented to Situation Alcohol / Substance Use: Not Applicable Psych Involvement: No (comment)  Admission diagnosis:  C. 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   Left-sided weakness 10/18/2021   CVA (cerebral vascular accident) ([place]) 10/18/2021   Elevated troponin 09/13/2021   Fall at home, initial encounter 09/13/2021   AKI (acute kidney injury) ([place]) 09/13/2021   Weakness 09/04/2021   COVID-19 virus infection 09/04/2021   Accelerating angina ([place]) 02/04/2021   Abnormal stress test    Cardiomyopathy ([place])    Wasp sting 12/25/2020   Chronic rhinitis 07/17/2020    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   CAD (coronary artery disease)    Depression    Arthritis 09/16/2019   Elevated glucose 05/13/2019   Dehydration 03/14/2019   Sick sinus syndrome ([place]) 07/08/2016   Goals of care, counseling/discussion 07/08/2016   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   Chronic fatigue    Thrombocytopenia ([place]) 02/29/2016   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    PAD (peripheral artery disease) (HCC)    OSA on CPAP    Chronic diastolic CHF (congestive heart failure) ([place]) 09/20/2015   CLL (chronic lymphocytic leukemia) ([place])    PTSD (post-traumatic stress disorder)    Chest pain 08/22/2015   Osteoarthritis of both knees 07/05/2015   COPD (chronic obstructive pulmonary disease) ([place]) 01/01/2012   Shortness of breath 10/09/2011   Paroxysmal atrial fibrillation ([place]) 10/09/2011   Hyperlipidemia 11/28/2009   Essential hypertension 11/28/2009   PCP:  Leone Haven, MD Pharmacy:   [facility] [place], [address] AT [address] [place] [address] Phone: [phone] Fax: [place] [address] Phone: [phone] Fax: [phone]  [facility] [address] [place] [address] Phone: [phone] Fax: [place], [place]. [place] [address] Phone: [phone] Fax: [phone]     Social Determinants of Health (SDOH) Interventions    Readmission Risk Interventions    10/19/2021   10:18 AM  Readmission Risk Prevention Plan  Transportation Screening Complete  PCP or Specialist Appt within 3-5 Days Complete  HRI or [place] Complete  Social Work Consult for [place] Planning/Counseling Complete  Palliative Care Screening Not Applicable  Medication Review Designer, fashion/clothing) Complete

## 2022-01-28 NOTE — Progress Notes (Signed)
PROGRESS NOTE    [name]  ALP:[id number] DOB: 30-Jan-1946 DOA: 01/27/2022 PCP: Leone Haven, MD    Brief Narrative:  [name] is a 76 y.o. male with medical history significant for HTN, HFpEF, A-fib on Eliquis, s/p PPM, CLL, COPD and OSA sent in by the cancer center for C. difficile diarrhea after presenting there with a 3-day history of vomiting and watery nonbloody nonmelanotic diarrhea.Work-up at the cancer center revealed hypokalemia and concern for acute kidney injury with creatinine of 1.3 up from a baseline of 0.81.  He was hydrated and given potassium repletion and sent for admission given his immunocompromise state.  Patient denies abdominal pain, fever or chills . ED course and data review: On arrival vitals initially within normal limits but he became hypotensive to 97/59 just prior to admission Labs creatinine corrected to 0.95, improved from 1.3 earlier in the day while at the cancer center.  Hemoglobin 11 which is not far from his baseline.  WBC normal.  Potassium normal at 3.6, improved with repletion given at the cancer center for potassium of 3.1 Abdominal x-ray shows no evidence of bowel obstruction or free air Patient started on IV hydration, oral vancomycin, hospitalist consulted for observation to ensure patient's ongoing stability prior to release home for continuation of therapy.     Had some diarrhea this am.   Consultants:    Procedures:   Antimicrobials:  Vancomycin po    Subjective:  No abd pain, sob, or vomiting  Objective: Vitals:   01/27/22 2300 01/27/22 2347 01/28/22 0527 01/28/22 0750  BP: 100/62 111/72 107/67 106/69  Pulse: 71 72 73 75  Resp: '17 16 16 14  '$ Temp:  97.8 F (36.6 C) 97.6 F (36.4 C) (!) 97.4 F (36.3 C)  TempSrc:  Oral  Oral  SpO2: 95% 100% 98% 97%  Weight:  91.5 kg    Height:  '5\' 9"'$  (1.753 m)      Intake/Output Summary (Last 24 hours) at 01/28/2022 0830 Last data filed at 01/28/2022 0325 Gross per 24 hour   Intake 326.06 ml  Output --  Net 326.06 ml   Filed Weights   01/27/22 2347  Weight: 91.5 kg    Examination:  Calm, NAD Cta no w/r Reg s1/s2 no gallop Soft benign +bs No edema Aaoxox3  Mood and affect appropriate in current setting     Data Reviewed: I have personally reviewed following labs and imaging studies  CBC: Recent Labs  Lab 01/27/22 1307 01/27/22 1846  WBC 12.6* 8.2  NEUTROABS 6.6 6.4  HGB 12.6* 11.0*  HCT 38.8* 34.8*  MCV 98.2 98.0  PLT 210 353   Basic Metabolic Panel: Recent Labs  Lab 01/27/22 1307 01/27/22 1846 01/28/22 0511  NA 135 137 138  K 3.1* 3.6 3.9  CL 105 108 112*  CO2 21* 21* 22  GLUCOSE 138* 160* 180*  BUN '23 21 19  '$ CREATININE 1.30* 0.95 1.01  CALCIUM 8.7* 8.4* 8.3*  MG 2.0  --   --    GFR: Estimated Creatinine Clearance: 69.5 mL/min (by C-G formula based on SCr of 1.01 mg/dL). Liver Function Tests: Recent Labs  Lab 01/27/22 1307  AST 41  ALT 34  ALKPHOS 128*  BILITOT 1.1  PROT 7.7  ALBUMIN 3.8   No results for input(s): "LIPASE", "AMYLASE" in the last 168 hours. No results for input(s): "AMMONIA" in the last 168 hours. Coagulation Profile: No results for input(s): "INR", "PROTIME" in the last 168 hours. Cardiac Enzymes:  No results for input(s): "CKTOTAL", "CKMB", "CKMBINDEX", "TROPONINI" in the last 168 hours. BNP (last 3 results) No results for input(s): "PROBNP" in the last 8760 hours. HbA1C: No results for input(s): "HGBA1C" in the last 72 hours. CBG: No results for input(s): "GLUCAP" in the last 168 hours. Lipid Profile: No results for input(s): "CHOL", "HDL", "LDLCALC", "TRIG", "CHOLHDL", "LDLDIRECT" in the last 72 hours. Thyroid Function Tests: No results for input(s): "TSH", "T4TOTAL", "FREET4", "T3FREE", "THYROIDAB" in the last 72 hours. Anemia Panel: No results for input(s): "VITAMINB12", "FOLATE", "FERRITIN", "TIBC", "IRON", "RETICCTPCT" in the last 72 hours. Sepsis Labs: No results for input(s):  "PROCALCITON", "LATICACIDVEN" in the last 168 hours.  Recent Results (from the past 240 hour(s))  C difficile quick screen w PCR reflex     Status: Abnormal   Collection Time: 01/27/22  2:28 PM   Specimen: STOOL  Result Value Ref Range Status   C Diff antigen POSITIVE (A) NEGATIVE Final   C Diff toxin POSITIVE (A) NEGATIVE Final   C Diff interpretation Toxin producing C. difficile detected.  Final    Comment: CRITICAL RESULT CALLED TO, READ BACK BY AND VERIFIED WITH: DR. [facility] AT 1621 01/27/22.PMF Performed at [facility], [address] [place], [place] [address]   Gastrointestinal Panel by PCR , Stool     Status: None   Collection Time: 01/27/22  2:28 PM  Result Value Ref Range Status   Campylobacter species NOT DETECTED NOT DETECTED Final   Plesimonas shigelloides NOT DETECTED NOT DETECTED Final   Salmonella species NOT DETECTED NOT DETECTED Final   Yersinia enterocolitica NOT DETECTED NOT DETECTED Final   Vibrio species NOT DETECTED NOT DETECTED Final   Vibrio cholerae NOT DETECTED NOT DETECTED Final   Enteroaggregative E coli (EAEC) NOT DETECTED NOT DETECTED Final   Enteropathogenic E coli (EPEC) NOT DETECTED NOT DETECTED Final   Enterotoxigenic E coli (ETEC) NOT DETECTED NOT DETECTED Final   Shiga like toxin producing E coli (STEC) NOT DETECTED NOT DETECTED Final   Shigella/Enteroinvasive E coli (EIEC) NOT DETECTED NOT DETECTED Final   Cryptosporidium NOT DETECTED NOT DETECTED Final   Cyclospora cayetanensis NOT DETECTED NOT DETECTED Final   Entamoeba histolytica NOT DETECTED NOT DETECTED Final   Giardia lamblia NOT DETECTED NOT DETECTED Final   Adenovirus F40/41 NOT DETECTED NOT DETECTED Final   Astrovirus NOT DETECTED NOT DETECTED Final   Norovirus GI/GII NOT DETECTED NOT DETECTED Final   Rotavirus A NOT DETECTED NOT DETECTED Final   Sapovirus (I, II, IV, and V) NOT DETECTED NOT DETECTED Final    Comment: Performed at [facility], [address] [place], [place] [address]         Radiology Studies: DG Abdomen 1 View  Result Date: 01/27/2022 CLINICAL DATA:  cdif eval for megacolon EXAM: ABDOMEN - 1 VIEW COMPARISON:  CT 12/12/2021 FINDINGS: Gas within mildly prominent transverse colon. No evidence of megacolon. No bowel obstruction or free air. No organomegaly. IMPRESSION: No evidence of bowel obstruction or free air. Electronically Signed   By: Rolm Baptise M.D.   On: 01/27/2022 22:10        Scheduled Meds:  amiodarone  100 mg Oral 2 times per day on Mon Tue Thu Fri Sat   apixaban  5 mg Oral BID   ezetimibe  10 mg Oral Daily   mometasone-formoterol  2 puff Inhalation BID   vancomycin  125 mg Oral Q6H   [START ON 01/29/2022] vancomycin  125 mg Oral QID  Followed by   Derrill Memo ON 02/11/2022] vancomycin  125 mg Oral BID   Followed by   Derrill Memo ON 02/18/2022] vancomycin  125 mg Oral Daily   Followed by   Derrill Memo ON 02/25/2022] vancomycin  125 mg Oral QODAY   Followed by   Derrill Memo ON 03/05/2022] vancomycin  125 mg Oral Q3 days   Continuous Infusions:  sodium chloride 100 mL/hr at 01/27/22 2350    Assessment & Plan:   Principal Problem:   Clostridioides difficile diarrhea Active Problems:   Hypotension   CAD (coronary artery disease)   Paroxysmal atrial fibrillation (HCC)   Chronic diastolic CHF (congestive heart failure) (HCC)   CLL (chronic lymphocytic leukemia) (HCC)   COPD (chronic obstructive pulmonary disease) (HCC)   OSA on CPAP  Clostridioides difficile diarrhea continue IV fluids Vancomycin to complete 14-day course.  No tapering needed since first time. WBC improving   Hypotension Secondary to fluid losses from diarrhea Patient had recent AKI treated with IV fluids in the cancer center with resolution 7/11 continue IV fluids   CAD (coronary artery disease) Stable, asymptomatic    Chronic diastolic CHF (congestive heart failure) ([place]) Euvolemic and more on the dry side  Holding furosemide  Monitor  closely as we are giving IV fluids      Paroxysmal atrial fibrillation (HCC) Continue apixaban and amiodarone   CLL (chronic lymphocytic leukemia) ([place]) Monitored by the cancer center.  Was seen on 7/10   OSA on CPAP CPAP nightly   COPD (chronic obstructive pulmonary disease) ([place]) Not acutely exacerbated Continue home inhalers DuoNebs as needed   DVT prophylaxis: Eliquis Code Status: Full Family Communication: None at bedside Disposition Plan:  Status is: Observation The patient remains OBS appropriate and will d/c before 2 midnights.        LOS: 0 days   Time spent:     Nolberto Hanlon, MD [facility] Pager [phone]  If 7PM-7AM, please contact night-coverage 01/28/2022, 8:30 AM

## 2022-01-28 NOTE — Telephone Encounter (Signed)
Patient called in to let us know he was not able to send in a transmission because he is in the hospital and will send it when he gets home

## 2022-01-28 NOTE — Telephone Encounter (Signed)
Pharmacy Patient Advocate Encounter  Insurance verification completed.    The patient is insured through [employer] Part D   The patient is currently admitted and ran test claims for the following: Vancomycin, Dificid.  Copays and coinsurance results were relayed to Inpatient clinical team.

## 2022-01-28 NOTE — TOC Benefit Eligibility Note (Signed)
Patient Teacher, English as a foreign language completed.    The patient is currently admitted and upon discharge could be taking Vancomycin 125 mg capsules.  The current 10 day co-pay is, $50.10.   The patient is currently admitted and upon discharge could be taking Dificid 200 mg tablets.  The current 10 day co-pay is, $253.74.   The patient is insured through [place] Patient Advocate Specialist [place] Patient Advocate Team Direct Number: [phone]  Fax: [phone]

## 2022-01-29 ENCOUNTER — Inpatient Hospital Stay: Payer: Medicare HMO

## 2022-01-29 ENCOUNTER — Other Ambulatory Visit: Payer: Medicare HMO

## 2022-01-29 ENCOUNTER — Encounter: Payer: Self-pay | Admitting: Internal Medicine

## 2022-01-29 ENCOUNTER — Ambulatory Visit: Payer: Medicare HMO

## 2022-01-29 DIAGNOSIS — A0472 Enterocolitis due to Clostridium difficile, not specified as recurrent: Secondary | ICD-10-CM | POA: Diagnosis not present

## 2022-01-29 MED ORDER — VANCOMYCIN HCL 125 MG PO CAPS: 125.0000 mg | ORAL_CAPSULE | Freq: Four times a day (QID) | ORAL | 0 refills | Status: DC

## 2022-01-29 NOTE — Discharge Summary (Signed)
Physician Discharge Summary  [name] MBW:[id number] DOB: 22-Sep-1945 DOA: 01/27/2022  PCP: Michael Haven, MD  Admit date: 01/27/2022 Discharge date: 01/29/2022  Admitted From: Home Disposition: Home  Recommendations for Outpatient Follow-up:  Follow up with PCP in 1-2 weeks Please obtain BMP/CBC in one week   Home Health: N/A Equipment/Devices: N/A  Discharge Condition: Stable CODE STATUS: Full code Diet recommendation: Regular diet, plenty of hydration  Discharge summary: Multiple medical issues with hypertension, chronic diastolic heart failure, A-fib on Eliquis, pacemaker, CLL, COPD and obstructive sleep apnea sent from cancer center with C. difficile diarrhea where he presented with 3 days of vomiting and watery nonbloody nonmelanotic stool.  He had recently used amoxicillin for a gland in his neck as per patient.  In the emergency room found to have acute kidney injury, clinical dehydration so admitted for hydration and treatment.  Treated with IV fluids, oral vancomycin.  Significantly improved.  Now with normal oral intake, 2-3 loose stool but more formed since admission.  With improvement of symptoms and correction of electrolytes and kidney functions he is able to hydrate by oral intake.  Eager to go home.  Plan: Resume all home medications.  Renal functions and blood pressure stabilized so he can also resume diuretics. First episode of C. difficile induced by amoxicillin, vancomycin 125 mg 4 times a day for total of 2 weeks. Contact precautions, enteric precautions discussed to utilize at home and patient feels comfortable.  He has CLL, recently found with increasing lymphadenopathy, oncology will follow-up.  Discharge Diagnoses:  Principal Problem:   Clostridioides difficile diarrhea Active Problems:   Hypotension   CAD (coronary artery disease)   Paroxysmal atrial fibrillation (HCC)   Chronic diastolic CHF (congestive heart failure) (HCC)   CLL (chronic  lymphocytic leukemia) (HCC)   COPD (chronic obstructive pulmonary disease) (HCC)   OSA on CPAP    Discharge Instructions  Discharge Instructions     Call MD for:  persistant nausea and vomiting   Complete by: As directed    Worsening diarrhea, report to clinic.   Diet general   Complete by: As directed    Increase activity slowly   Complete by: As directed       Allergies as of 01/29/2022   No Known Allergies      Medication List     STOP taking these medications    Calquence 100 MG Tabs Generic drug: acalabrutinib maleate   guaiFENesin-dextromethorphan 100-10 MG/5ML syrup Commonly known as: ROBITUSSIN DM   loperamide 2 MG capsule Commonly known as: IMODIUM   ondansetron 8 MG tablet Commonly known as: ZOFRAN   prochlorperazine 10 MG tablet Commonly known as: COMPAZINE       TAKE these medications    acetaminophen 325 MG tablet Commonly known as: TYLENOL Take 1-2 tablets (325-650 mg total) by mouth every 4 (four) hours as needed for mild pain.   acyclovir 400 MG tablet Commonly known as: ZOVIRAX TAKE 1 TABLET(400 MG) BY MOUTH TWICE DAILY   albuterol 108 (90 Base) MCG/ACT inhaler Commonly known as: VENTOLIN HFA Inhale 2 puffs into the lungs every 6 (six) hours as needed for wheezing or shortness of breath.   amiodarone 200 MG tablet Commonly known as: PACERONE Take 1/2 tablet (100 mg) by mouth twice daily 5 days a week   apixaban 5 MG Tabs tablet Commonly known as: ELIQUIS Take 5 mg by mouth 2 (two) times daily.   azelastine 0.1 % nasal spray Commonly known as: ASTELIN Place 2  sprays into both nostrils 2 (two) times daily. Use in each nostril as directed   budesonide-formoterol 160-4.5 MCG/ACT inhaler Commonly known as: SYMBICORT Inhale 2 puffs into the lungs 2 (two) times daily.   cetirizine 10 MG tablet Commonly known as: ZYRTEC Take 10 mg by mouth daily as needed for allergies.   CoQ10 200 MG Caps Take 200 mg by mouth daily.    diphenoxylate-atropine 2.5-0.025 MG tablet Commonly known as: LOMOTIL Take 1 tablet by mouth 4 (four) times daily as needed for diarrhea or loose stools.   ezetimibe 10 MG tablet Commonly known as: ZETIA Take 1 tablet (10 mg total) by mouth daily.   furosemide 40 MG tablet Commonly known as: LASIX Take 1 tablet (40 mg total) by mouth daily.   Krill Oil 350 MG Caps Take 350 mg by mouth daily as needed (Heart).   mirtazapine 15 MG tablet Commonly known as: REMERON Take 15 mg by mouth at bedtime as needed (for panic associated with PTSD).   multivitamin with minerals Tabs tablet Take 1 tablet by mouth daily.   nitroGLYCERIN 0.4 MG SL tablet Commonly known as: NITROSTAT Place 1 tablet (0.4 mg total) under the tongue every 5 (five) minutes as needed for chest pain. What changed: additional instructions   Praluent 150 MG/ML So[place] Generic drug: Alirocumab Inject 150 mg into the skin every 14 (fourteen) days.   Tiotropium Bromide Monohydrate 2.5 MCG/ACT Aers Inhale 2 puffs into the lungs at bedtime. Spiriva   traMADol 50 MG tablet Commonly known as: ULTRAM Take 1 tablet (50 mg total) by mouth every 12 (twelve) hours as needed.   vancomycin 125 MG capsule Commonly known as: VANCOCIN Take 1 capsule (125 mg total) by mouth 4 (four) times daily for 13 days.        No Known Allergies  Consultations: None   Procedures/Studies: DG Abdomen 1 View  Result Date: 01/27/2022 CLINICAL DATA:  cdif eval for megacolon EXAM: ABDOMEN - 1 VIEW COMPARISON:  CT 12/12/2021 FINDINGS: Gas within mildly prominent transverse colon. No evidence of megacolon. No bowel obstruction or free air. No organomegaly. IMPRESSION: No evidence of bowel obstruction or free air. Electronically Signed   By: Rolm Baptise M.D.   On: 01/27/2022 22:10   CT Soft Tissue Neck W Contrast  Result Date: 01/16/2022 CLINICAL DATA:  Occult malignancy lymphadenopathy and dysphagia h/o CLL EXAM: CT NECK WITH CONTRAST  TECHNIQUE: Multidetector CT imaging of the neck was performed using the standard protocol following the bolus administration of intravenous contrast. RADIATION DOSE REDUCTION: This exam was performed according to the departmental dose-optimization program which includes automated exposure control, adjustment of the mA and/or kV according to patient size and/or use of iterative reconstruction technique. CONTRAST:  25m OMNIPAQUE IOHEXOL 300 MG/ML  SOLN COMPARISON:  12/12/2021 cough FINDINGS: Pharynx and larynx: Unremarkable.  No mass or swelling. Salivary glands: Parotid and submandibular glands are unremarkable. Thyroid: Normal. Lymph nodes: Nodes on the prior study are increased in size. For example, right level 5 node measures 1.2 cm on series 2, image 111 (previously 0.9 cm). Left level 5 node measures 1.1 cm on image 91 (previously 0.7 cm). Left level 2 node measures 1 cm on image 86 (previously 0.5 cm). Vascular: Major neck vessels are patent. Calcified plaque at the common carotid bifurcations. Limited intracranial: No abnormal enhancement. Visualized orbits: Unremarkable. Mastoids and visualized paranasal sinuses: Mastoid air cells are clear. Polypoid paranasal sinus mucosal thickening. Skeleton: Advanced degenerative changes of the cervical spine similar to the  prior study. Upper chest: Increased adenopathy.  No apical lung mass. Other: None. IMPRESSION: Further progression of adenopathy since 12/12/2021 in the neck and included chest. Electronically Signed   By: Macy Mis M.D.   On: 01/16/2022 14:01   (Echo, Carotid, EGD, Colonoscopy, ERCP)    Subjective: Patient seen and examined.  No overnight events.  He had 1 diarrhea with some formed stool last night.  Tolerating regular diet.  Wondering about going home.  Afebrile.   Discharge Exam: Vitals:   01/29/22 0446 01/29/22 0834  BP: 103/63 121/75  Pulse: 60 65  Resp: 17 18  Temp: 98.2 F (36.8 C) 98.1 F (36.7 C)  SpO2: 98% 99%    Vitals:   01/28/22 1553 01/28/22 1937 01/29/22 0446 01/29/22 0834  BP: 107/62 124/74 103/63 121/75  Pulse: 70 79 60 65  Resp:  '18 17 18  '$ Temp: 97.8 F (36.6 C)  98.2 F (36.8 C) 98.1 F (36.7 C)  TempSrc:    Oral  SpO2: 100% 97% 98% 99%  Weight:      Height:        General: Pt is alert, awake, not in acute distress Cardiovascular: RRR, S1/S2 +, no rubs, no gallops Respiratory: CTA bilaterally, no wheezing, no rhonchi Abdominal: Soft, NT, ND, bowel sounds + Extremities: no edema, no cyanosis    The results of significant diagnostics from this hospitalization (including imaging, microbiology, ancillary and laboratory) are listed below for reference.     Microbiology: Recent Results (from the past 240 hour(s))  C difficile quick screen w PCR reflex     Status: Abnormal   Collection Time: 01/27/22  2:28 PM   Specimen: STOOL  Result Value Ref Range Status   C Diff antigen POSITIVE (A) NEGATIVE Final   C Diff toxin POSITIVE (A) NEGATIVE Final   C Diff interpretation Toxin producing C. difficile detected.  Final    Comment: CRITICAL RESULT CALLED TO, READ BACK BY AND VERIFIED WITH: DR. [facility] AT 1621 01/27/22.PMF Performed at [facility], [address] [place], [place] [address]   Gastrointestinal Panel by PCR , Stool     Status: None   Collection Time: 01/27/22  2:28 PM  Result Value Ref Range Status   Campylobacter species NOT DETECTED NOT DETECTED Final   Plesimonas shigelloides NOT DETECTED NOT DETECTED Final   Salmonella species NOT DETECTED NOT DETECTED Final   Yersinia enterocolitica NOT DETECTED NOT DETECTED Final   Vibrio species NOT DETECTED NOT DETECTED Final   Vibrio cholerae NOT DETECTED NOT DETECTED Final   Enteroaggregative E coli (EAEC) NOT DETECTED NOT DETECTED Final   Enteropathogenic E coli (EPEC) NOT DETECTED NOT DETECTED Final   Enterotoxigenic E coli (ETEC) NOT DETECTED NOT DETECTED Final   Shiga like toxin producing E coli (STEC)  NOT DETECTED NOT DETECTED Final   Shigella/Enteroinvasive E coli (EIEC) NOT DETECTED NOT DETECTED Final   Cryptosporidium NOT DETECTED NOT DETECTED Final   Cyclospora cayetanensis NOT DETECTED NOT DETECTED Final   Entamoeba histolytica NOT DETECTED NOT DETECTED Final   Giardia lamblia NOT DETECTED NOT DETECTED Final   Adenovirus F40/41 NOT DETECTED NOT DETECTED Final   Astrovirus NOT DETECTED NOT DETECTED Final   Norovirus GI/GII NOT DETECTED NOT DETECTED Final   Rotavirus A NOT DETECTED NOT DETECTED Final   Sapovirus (I, II, IV, and V) NOT DETECTED NOT DETECTED Final    Comment: Performed at [facility], [address] [place], [place] [address]     Labs:  BNP (last 3 results) Recent Labs    09/13/21 1546  BNP 42.6   Basic Metabolic Panel: Recent Labs  Lab 01/27/22 1307 01/27/22 1846 01/28/22 0511  NA 135 137 138  K 3.1* 3.6 3.9  CL 105 108 112*  CO2 21* 21* 22  GLUCOSE 138* 160* 180*  BUN '23 21 19  '$ CREATININE 1.30* 0.95 1.01  CALCIUM 8.7* 8.4* 8.3*  MG 2.0  --   --    Liver Function Tests: Recent Labs  Lab 01/27/22 1307  AST 41  ALT 34  ALKPHOS 128*  BILITOT 1.1  PROT 7.7  ALBUMIN 3.8   No results for input(s): "LIPASE", "AMYLASE" in the last 168 hours. No results for input(s): "AMMONIA" in the last 168 hours. CBC: Recent Labs  Lab 01/27/22 1307 01/27/22 1846  WBC 12.6* 8.2  NEUTROABS 6.6 6.4  HGB 12.6* 11.0*  HCT 38.8* 34.8*  MCV 98.2 98.0  PLT 210 186   Cardiac Enzymes: No results for input(s): "CKTOTAL", "CKMB", "CKMBINDEX", "TROPONINI" in the last 168 hours. BNP: Invalid input(s): "POCBNP" CBG: No results for input(s): "GLUCAP" in the last 168 hours. D-Dimer No results for input(s): "DDIMER" in the last 72 hours. Hgb A1c No results for input(s): "HGBA1C" in the last 72 hours. Lipid Profile No results for input(s): "CHOL", "HDL", "LDLCALC", "TRIG", "CHOLHDL", "LDLDIRECT" in the last 72 hours. Thyroid function studies No  results for input(s): "TSH", "T4TOTAL", "T3FREE", "THYROIDAB" in the last 72 hours.  Invalid input(s): "FREET3" Anemia work up No results for input(s): "VITAMINB12", "FOLATE", "FERRITIN", "TIBC", "IRON", "RETICCTPCT" in the last 72 hours. Urinalysis    Component Value Date/Time   COLORURINE STRAW (A) 09/13/2021 1206   APPEARANCEUR CLEAR (A) 09/13/2021 1206   APPEARANCEUR Clear 10/11/2014 2003   LABSPEC 1.009 09/13/2021 1206   LABSPEC 1.017 10/11/2014 2003   PHURINE 7.0 09/13/2021 1206   GLUCOSEU NEGATIVE 09/13/2021 1206   GLUCOSEU Negative 10/11/2014 2003   HGBUR NEGATIVE 09/13/2021 1206   BILIRUBINUR NEGATIVE 09/13/2021 1206   BILIRUBINUR Negative 10/11/2014 2003   KETONESUR NEGATIVE 09/13/2021 1206   PROTEINUR NEGATIVE 09/13/2021 1206   UROBILINOGEN 1.0 05/12/2009 1426   NITRITE NEGATIVE 09/13/2021 1206   LEUKOCYTESUR NEGATIVE 09/13/2021 1206   LEUKOCYTESUR Negative 10/11/2014 2003   Sepsis Labs Recent Labs  Lab 01/27/22 1307 01/27/22 1846  WBC 12.6* 8.2   Microbiology Recent Results (from the past 240 hour(s))  C difficile quick screen w PCR reflex     Status: Abnormal   Collection Time: 01/27/22  2:28 PM   Specimen: STOOL  Result Value Ref Range Status   C Diff antigen POSITIVE (A) NEGATIVE Final   C Diff toxin POSITIVE (A) NEGATIVE Final   C Diff interpretation Toxin producing C. difficile detected.  Final    Comment: CRITICAL RESULT CALLED TO, READ BACK BY AND VERIFIED WITH: DR. [facility] AT 1621 01/27/22.PMF Performed at [facility], [address] [place], [place] [address]   Gastrointestinal Panel by PCR , Stool     Status: None   Collection Time: 01/27/22  2:28 PM  Result Value Ref Range Status   Campylobacter species NOT DETECTED NOT DETECTED Final   Plesimonas shigelloides NOT DETECTED NOT DETECTED Final   Salmonella species NOT DETECTED NOT DETECTED Final   Yersinia enterocolitica NOT DETECTED NOT DETECTED Final   Vibrio species NOT DETECTED  NOT DETECTED Final   Vibrio cholerae NOT DETECTED NOT DETECTED Final   Enteroaggregative E coli (EAEC) NOT DETECTED NOT DETECTED Final  Enteropathogenic E coli (EPEC) NOT DETECTED NOT DETECTED Final   Enterotoxigenic E coli (ETEC) NOT DETECTED NOT DETECTED Final   Shiga like toxin producing E coli (STEC) NOT DETECTED NOT DETECTED Final   Shigella/Enteroinvasive E coli (EIEC) NOT DETECTED NOT DETECTED Final   Cryptosporidium NOT DETECTED NOT DETECTED Final   Cyclospora cayetanensis NOT DETECTED NOT DETECTED Final   Entamoeba histolytica NOT DETECTED NOT DETECTED Final   Giardia lamblia NOT DETECTED NOT DETECTED Final   Adenovirus F40/41 NOT DETECTED NOT DETECTED Final   Astrovirus NOT DETECTED NOT DETECTED Final   Norovirus GI/GII NOT DETECTED NOT DETECTED Final   Rotavirus A NOT DETECTED NOT DETECTED Final   Sapovirus (I, II, IV, and V) NOT DETECTED NOT DETECTED Final    Comment: Performed at [facility], [address] [place], [place] [address]     Time coordinating discharge: 35 minutes  SIGNED:   Barb Merino, MD  [facility] 01/29/2022, 2:47 PM

## 2022-01-30 ENCOUNTER — Other Ambulatory Visit: Payer: Self-pay | Admitting: Internal Medicine

## 2022-01-30 ENCOUNTER — Inpatient Hospital Stay: Payer: Medicare HMO

## 2022-01-30 ENCOUNTER — Ambulatory Visit: Payer: Medicare HMO

## 2022-01-30 ENCOUNTER — Telehealth: Payer: Self-pay

## 2022-01-30 ENCOUNTER — Ambulatory Visit: Payer: Self-pay | Admitting: *Deleted

## 2022-01-30 ENCOUNTER — Telehealth: Payer: Self-pay | Admitting: Pharmacist

## 2022-01-30 ENCOUNTER — Other Ambulatory Visit: Payer: Medicare HMO

## 2022-01-30 MED ORDER — VANCOMYCIN HCL 125 MG PO CAPS: 125.0000 mg | ORAL_CAPSULE | Freq: Four times a day (QID) | ORAL | 0 refills | Status: AC

## 2022-01-30 NOTE — Patient Outreach (Signed)
  Care Coordination [facility] Note Transition Care Management Unsuccessful Follow-up Telephone Call  Date of discharge and from where:  01/29/22 [facility] [place]  Attempts:  1st Attempt  Reason for unsuccessful TCM follow-up call:  Left voice message  Hubert Azure RN, MSN RN Care Management Coordinator  [place] [phone] [email or web address]

## 2022-01-30 NOTE — Telephone Encounter (Signed)
Oral Chemotherapy Pharmacist Encounter   Mr. Hyland was recently discharged from the hospital. Dr. Rogue Bussing would like to have Mr. Bexley hold on getting started on treatment until next appt on 03/04/22. Called Mr. Caldwell to inform of this.   I will see him on 03/04/22 for [place] education.   Darl Pikes, PharmD, BCPS, BCOP, CPP Hematology/Oncology Clinical Pharmacist [place]/DB/AP Oral [facility] [phone]  01/30/2022 11:10 AM

## 2022-01-30 NOTE — Patient Outreach (Signed)
  Care Coordination [facility] Note Transition Care Management Follow-up Telephone Call Date of discharge and from where: 7/12 [place] How have you been since you were released from the hospital? [place] Any questions or concerns? No  Items Reviewed: Did the pt receive and understand the discharge instructions provided? Yes  Medications obtained and verified? Yes  Other? No  Any new allergies since your discharge? No  Dietary orders reviewed? Yes Do you have support at home? Yes   Home Care and Equipment/Supplies: Were home health services ordered? no If so, what is the name of the agency? NA  Has the agency set up a time to come to the patient's home? not applicable Were any new equipment or medical supplies ordered?  No What is the name of the medical supply agency? NA Were you able to get the supplies/equipment? not applicable Do you have any questions related to the use of the equipment or supplies? No  Functional Questionnaire: (I = Independent and D = Dependent) ADLs: I  Bathing/Dressing- I  Meal Prep- I  Eating- I  Maintaining continence- I  Transferring/Ambulation- I WITH CANE OR WALKER  Managing Meds- I  Follow up appointments reviewed:  PCP Hospital f/u appt confirmed? No  , WIFE TO CALL AND ARRANGE TODAY WITH PCP [facility] f/u appt confirmed? No   Are transportation arrangements needed? No  If their condition worsens, is the pt aware to call PCP or go to the Emergency Dept.? Yes Was the patient provided with contact information for the PCP's office or ED? Yes Was to pt encouraged to call back with questions or concerns? Yes  SDOH assessments and interventions completed:   Yes  Care Coordination Interventions Activated:  Yes Care Coordination Interventions:  PCP follow up appointment requested  Encounter Outcome:  Pt. Visit Completed   Hubert Azure RN, MSN RN Care Management Coordinator  [place] [phone] [email or web address]

## 2022-01-30 NOTE — Telephone Encounter (Signed)
Transition Care Management Unsuccessful Follow-up Telephone Call  Date of discharge and from where:  01/29/22 [facility]  Attempts:  1st Attempt  Reason for unsuccessful TCM follow-up call:  Unable to reach patient. Will follow.

## 2022-01-31 NOTE — Telephone Encounter (Signed)
Transition Care Management Follow-up Telephone Call Date of discharge and from where: 01/29/22 [facility] How have you been since you were released from the hospital? Diarrhea is tapering off, soft stool forming in small amount. Non-bloody stool. Input/output appropriate. Staying hydrated. Resting. CPAP in use at bedtime. Denies worsening diarrhea, n/v, pain, fever, dizziness, shortness of breath and all other alarming symptoms.  Any questions or concerns? No  Items Reviewed: Did the pt receive and understand the discharge instructions provided? Yes . Increasing activity slowly.  Medications obtained and verified? Yes  Any new allergies since your discharge? No  Dietary orders reviewed? Yes Do you have support at home? Yes   Home Care and Equipment/Supplies: Were home health services ordered? No  Functional Questionnaire: (I = Independent and D = Dependent) ADLs: I  Bathing/Dressing- I  Eating- I  Maintaining continence- I  Transferring/Ambulation- Walker and/or cane  Managing Meds- I  Follow up appointments reviewed:  PCP Hospital f/u appt confirmed? Yes  Scheduled to see PCP on 02/04/22 @ 11:00. [place] [facility] f/u appt confirmed? N/A Are transportation arrangements needed? No  If their condition worsens, is the pt aware to call PCP or go to the Emergency Dept.? Yes Was the patient provided with contact information for the PCP's office or ED? Yes Was to pt encouraged to call back with questions or concerns? Yes

## 2022-02-03 ENCOUNTER — Encounter: Payer: Self-pay | Admitting: Internal Medicine

## 2022-02-04 ENCOUNTER — Encounter: Payer: Self-pay | Admitting: Family Medicine

## 2022-02-04 ENCOUNTER — Ambulatory Visit (INDEPENDENT_AMBULATORY_CARE_PROVIDER_SITE_OTHER): Payer: Medicare HMO

## 2022-02-04 ENCOUNTER — Ambulatory Visit (INDEPENDENT_AMBULATORY_CARE_PROVIDER_SITE_OTHER): Payer: No Typology Code available for payment source | Admitting: Family Medicine

## 2022-02-04 VITALS — BP 118/80 | HR 79 | Temp 97.9°F | Ht 69.0 in | Wt 204.6 lb

## 2022-02-04 DIAGNOSIS — I495 Sick sinus syndrome: Secondary | ICD-10-CM

## 2022-02-04 DIAGNOSIS — A0472 Enterocolitis due to Clostridium difficile, not specified as recurrent: Secondary | ICD-10-CM

## 2022-02-04 LAB — CUP PACEART REMOTE DEVICE CHECK
Battery Remaining Longevity: 50 mo
Battery Voltage: 2.98 V
Brady Statistic AP VP Percent: 0.04 %
Brady Statistic AP VS Percent: 33.22 %
Brady Statistic AS VP Percent: 0.09 %
Brady Statistic AS VS Percent: 66.65 %
Brady Statistic RA Percent Paced: 33.26 %
Brady Statistic RV Percent Paced: 0.13 %
Date Time Interrogation Session: 20230718093942
Implantable Lead Implant Date: 20170814
Implantable Lead Implant Date: 20170814
Implantable Lead Location: 753859
Implantable Lead Location: 753860
Implantable Lead Model: 5076
Implantable Lead Model: 5076
Implantable Pulse Generator Implant Date: 20170814
Lead Channel Impedance Value: 304 Ohm
Lead Channel Impedance Value: 418 Ohm
Lead Channel Impedance Value: 456 Ohm
Lead Channel Impedance Value: 532 Ohm
Lead Channel Pacing Threshold Amplitude: 0.75 V
Lead Channel Pacing Threshold Amplitude: 1 V
Lead Channel Pacing Threshold Pulse Width: 0.4 ms
Lead Channel Pacing Threshold Pulse Width: 0.4 ms
Lead Channel Sensing Intrinsic Amplitude: 16.875 mV
Lead Channel Sensing Intrinsic Amplitude: 16.875 mV
Lead Channel Sensing Intrinsic Amplitude: 3 mV
Lead Channel Sensing Intrinsic Amplitude: 3 mV
Lead Channel Setting Pacing Amplitude: 2 V
Lead Channel Setting Pacing Amplitude: 2.5 V
Lead Channel Setting Pacing Pulse Width: 0.4 ms
Lead Channel Setting Sensing Sensitivity: 0.9 mV

## 2022-02-04 LAB — BASIC METABOLIC PANEL
BUN: 15 mg/dL (ref 6–23)
CO2: 27 mEq/L (ref 19–32)
Calcium: 8.4 mg/dL (ref 8.4–10.5)
Chloride: 110 mEq/L (ref 96–112)
Creatinine, Ser: 0.77 mg/dL (ref 0.40–1.50)
GFR: 86.89 mL/min (ref >60.00)
Glucose, Bld: 101 mg/dL — ABNORMAL HIGH (ref 70–99)
Potassium: 3.6 mEq/L (ref 3.5–5.1)
Sodium: 143 mEq/L (ref 135–145)

## 2022-02-04 LAB — CBC
HCT: 33.6 % — ABNORMAL LOW (ref 39.0–52.0)
Hemoglobin: 11.1 g/dL — ABNORMAL LOW (ref 13.0–17.0)
MCHC: 33.1 g/dL (ref 30.0–36.0)
MCV: 96.6 fl (ref 78.0–100.0)
Platelets: 161 10*3/uL (ref 150.0–400.0)
RBC: 3.48 Mil/uL — ABNORMAL LOW (ref 4.22–5.81)
RDW: 18.1 % — ABNORMAL HIGH (ref 11.5–15.5)
WBC: 9.4 10*3/uL (ref 4.0–10.5)

## 2022-02-04 NOTE — Patient Instructions (Signed)
Nice to see you. We will get lab work today. Please make sure you complete all of the vancomycin.  If your diarrhea is not resolved by the time you finish the vancomycin please let me know.  If you develop blood in your stool or abdominal pain please seek medical attention.

## 2022-02-04 NOTE — Assessment & Plan Note (Signed)
Patient will finish his course of vancomycin.  Discussed if his diarrhea does not resolve by the time he finishes this course of medication he needs to let us know.  He will seek medical attention for blood in his stool or abdominal pain.  We will check lab work today.

## 2022-02-04 NOTE — Progress Notes (Signed)
[name], MD Phone: [phone]  [name] is a 76 y.o. male who presents today for hospital follow-up.  C. difficile colitis: Patient developed diarrhea after taking amoxicillin.  He notes this was pure liquid.  He was found to have C. difficile colitis along with AKI.  He was admitted to the hospital and treated with oral vancomycin.  He was given IV fluids.  He was discharged as things were improving.  He notes mild nausea.  He continues to have diarrhea though there is some forming to the stools at this point.  He notes no blood in stool.  No abdominal pain.  His urine is a normal yellow color.  He continues on vancomycin for a total of 14 days.  Social History   Tobacco Use  Smoking Status Former   Packs/day: 1.00   Years: 40.00   Total pack years: 40.00   Types: Cigarettes   Quit date: 07/21/2006   Years since quitting: 15.5  Smokeless Tobacco Never    Current Outpatient Medications on File Prior to Visit  Medication Sig Dispense Refill   acetaminophen (TYLENOL) 325 MG tablet Take 1-2 tablets (325-650 mg total) by mouth every 4 (four) hours as needed for mild pain.     acyclovir (ZOVIRAX) 400 MG tablet TAKE 1 TABLET(400 MG) BY MOUTH TWICE DAILY 60 tablet 6   albuterol (VENTOLIN HFA) 108 (90 Base) MCG/ACT inhaler Inhale 2 puffs into the lungs every 6 (six) hours as needed for wheezing or shortness of breath. 1 each 0   Alirocumab (PRALUENT) 150 MG/ML SOAJ Inject 150 mg into the skin every 14 (fourteen) days. 2 mL 12   amiodarone (PACERONE) 200 MG tablet Take 1/2 tablet (100 mg) by mouth twice daily 5 days a week 60 tablet 6   apixaban (ELIQUIS) 5 MG TABS tablet Take 5 mg by mouth 2 (two) times daily.     azelastine (ASTELIN) 0.1 % nasal spray Place 2 sprays into both nostrils 2 (two) times daily. Use in each nostril as directed 30 mL 1   budesonide-formoterol (SYMBICORT) 160-4.5 MCG/ACT inhaler Inhale 2 puffs into the lungs 2 (two) times daily. 1 Inhaler 11    cetirizine (ZYRTEC) 10 MG tablet Take 10 mg by mouth daily as needed for allergies.      Coenzyme Q10 (COQ10) 200 MG CAPS Take 200 mg by mouth daily.     diphenoxylate-atropine (LOMOTIL) 2.5-0.025 MG tablet Take 1 tablet by mouth 4 (four) times daily as needed for diarrhea or loose stools. 45 tablet 0   ezetimibe (ZETIA) 10 MG tablet Take 1 tablet (10 mg total) by mouth daily. 90 tablet 3   Krill Oil 350 MG CAPS Take 350 mg by mouth daily as needed (Heart).     mirtazapine (REMERON) 15 MG tablet Take 15 mg by mouth at bedtime as needed (for panic associated with PTSD).      Multiple Vitamin (MULTIVITAMIN WITH MINERALS) TABS tablet Take 1 tablet by mouth daily.     nitroGLYCERIN (NITROSTAT) 0.4 MG SL tablet Place 1 tablet (0.4 mg total) under the tongue every 5 (five) minutes as needed for chest pain. (Patient taking differently: Place 0.4 mg under the tongue every 5 (five) minutes as needed for chest pain. Has not needed) 25 tablet 6   Tiotropium Bromide Monohydrate 2.5 MCG/ACT AERS Inhale 2 puffs into the lungs at bedtime. Spiriva     traMADol (ULTRAM) 50 MG tablet Take 1 tablet (50 mg total) by mouth every 12 (twelve) hours  as needed. 60 tablet 0   vancomycin (VANCOCIN) 125 MG capsule Take 1 capsule (125 mg total) by mouth 4 (four) times daily for 10 days. 40 capsule 0   furosemide (LASIX) 40 MG tablet Take 1 tablet (40 mg total) by mouth daily. (Patient not taking: Reported on 02/04/2022) 30 tablet 0   No current facility-administered medications on file prior to visit.     ROS see history of present illness  Objective  Physical Exam Vitals:   02/04/22 1103  BP: 118/80  Pulse: 79  Temp: 97.9 F (36.6 C)  SpO2: 99%    BP Readings from Last 3 Encounters:  02/04/22 118/80  01/29/22 122/65  01/27/22 117/73   Wt Readings from Last 3 Encounters:  02/04/22 204 lb 9.6 oz (92.8 kg)  01/27/22 201 lb 11.5 oz (91.5 kg)  01/20/22 206 lb (93.4 kg)    Physical Exam Constitutional:       General: He is not in acute distress.    Appearance: He is not diaphoretic.  Cardiovascular:     Rate and Rhythm: Normal rate and regular rhythm.     Heart sounds: Normal heart sounds.  Pulmonary:     Effort: Pulmonary effort is normal.  Abdominal:     General: Bowel sounds are normal. There is no distension.     Palpations: Abdomen is soft.     Tenderness: There is no abdominal tenderness.  Skin:    General: Skin is warm and dry.  Neurological:     Mental Status: He is alert.      Assessment/Plan: Please see individual problem list.  Problem List Items Addressed This Visit     Clostridioides difficile diarrhea    Patient will finish his course of vancomycin.  Discussed if his diarrhea does not resolve by the time he finishes this course of medication he needs to let us know.  He will seek medical attention for blood in his stool or abdominal pain.  We will check lab work today.      Other Visit Diagnoses     C. difficile colitis    -  Primary   Relevant Orders   Basic Metabolic Panel (BMET)   CBC      Return for as scheduled.   [name], MD [place]

## 2022-02-10 ENCOUNTER — Other Ambulatory Visit: Payer: Self-pay

## 2022-02-10 ENCOUNTER — Ambulatory Visit: Payer: Medicare HMO | Admitting: Neurology

## 2022-02-11 ENCOUNTER — Other Ambulatory Visit (HOSPITAL_COMMUNITY): Payer: Self-pay

## 2022-02-18 ENCOUNTER — Other Ambulatory Visit: Payer: Self-pay

## 2022-03-04 ENCOUNTER — Inpatient Hospital Stay: Payer: Medicare HMO

## 2022-03-04 ENCOUNTER — Encounter: Payer: Self-pay | Admitting: Internal Medicine

## 2022-03-04 ENCOUNTER — Inpatient Hospital Stay: Payer: Medicare HMO | Admitting: Pharmacist

## 2022-03-04 ENCOUNTER — Inpatient Hospital Stay: Payer: Medicare HMO | Attending: Internal Medicine | Admitting: Internal Medicine

## 2022-03-04 DIAGNOSIS — D649 Anemia, unspecified: Secondary | ICD-10-CM | POA: Diagnosis not present

## 2022-03-04 DIAGNOSIS — R591 Generalized enlarged lymph nodes: Secondary | ICD-10-CM | POA: Diagnosis not present

## 2022-03-04 DIAGNOSIS — I4891 Unspecified atrial fibrillation: Secondary | ICD-10-CM | POA: Insufficient documentation

## 2022-03-04 DIAGNOSIS — D801 Nonfamilial hypogammaglobulinemia: Secondary | ICD-10-CM | POA: Insufficient documentation

## 2022-03-04 DIAGNOSIS — R918 Other nonspecific abnormal finding of lung field: Secondary | ICD-10-CM | POA: Diagnosis not present

## 2022-03-04 DIAGNOSIS — E860 Dehydration: Secondary | ICD-10-CM | POA: Insufficient documentation

## 2022-03-04 DIAGNOSIS — Z7901 Long term (current) use of anticoagulants: Secondary | ICD-10-CM | POA: Insufficient documentation

## 2022-03-04 DIAGNOSIS — E876 Hypokalemia: Secondary | ICD-10-CM | POA: Insufficient documentation

## 2022-03-04 DIAGNOSIS — Z79899 Other long term (current) drug therapy: Secondary | ICD-10-CM | POA: Diagnosis not present

## 2022-03-04 DIAGNOSIS — Z8673 Personal history of transient ischemic attack (TIA), and cerebral infarction without residual deficits: Secondary | ICD-10-CM | POA: Insufficient documentation

## 2022-03-04 DIAGNOSIS — C9110 Chronic lymphocytic leukemia of B-cell type not having achieved remission: Secondary | ICD-10-CM

## 2022-03-04 DIAGNOSIS — Z87891 Personal history of nicotine dependence: Secondary | ICD-10-CM | POA: Insufficient documentation

## 2022-03-04 LAB — COMPREHENSIVE METABOLIC PANEL
ALT: 21 U/L (ref 0–44)
AST: 26 U/L (ref 15–41)
Albumin: 3.8 g/dL (ref 3.5–5.0)
Alkaline Phosphatase: 99 U/L (ref 38–126)
Anion gap: 9 (ref 5–15)
BUN: 16 mg/dL (ref 8–23)
CO2: 26 mmol/L (ref 22–32)
Calcium: 8.7 mg/dL — ABNORMAL LOW (ref 8.9–10.3)
Chloride: 106 mmol/L (ref 98–111)
Creatinine, Ser: 0.98 mg/dL (ref 0.61–1.24)
GFR, Estimated: >60 mL/min (ref >60)
Glucose, Bld: 107 mg/dL — ABNORMAL HIGH (ref 70–99)
Potassium: 3.8 mmol/L (ref 3.5–5.1)
Sodium: 141 mmol/L (ref 135–145)
Total Bilirubin: 0.7 mg/dL (ref 0.3–1.2)
Total Protein: 7.5 g/dL (ref 6.5–8.1)

## 2022-03-04 LAB — CBC WITH DIFFERENTIAL/PLATELET
Abs Immature Granulocytes: 0.04 10*3/uL (ref 0.00–0.07)
Basophils Absolute: 0.1 10*3/uL (ref 0.0–0.1)
Basophils Relative: 1 %
Eosinophils Absolute: 0.7 10*3/uL — ABNORMAL HIGH (ref 0.0–0.5)
Eosinophils Relative: 7 %
HCT: 36.5 % — ABNORMAL LOW (ref 39.0–52.0)
Hemoglobin: 11.7 g/dL — ABNORMAL LOW (ref 13.0–17.0)
Immature Granulocytes: 0 %
Lymphocytes Relative: 42 %
Lymphs Abs: 3.8 10*3/uL (ref 0.7–4.0)
MCH: 32.3 pg (ref 26.0–34.0)
MCHC: 32.1 g/dL (ref 30.0–36.0)
MCV: 100.8 fL — ABNORMAL HIGH (ref 80.0–100.0)
Monocytes Absolute: 0.9 10*3/uL (ref 0.1–1.0)
Monocytes Relative: 9 %
Neutro Abs: 3.8 10*3/uL (ref 1.7–7.7)
Neutrophils Relative %: 41 %
Platelets: 140 10*3/uL — ABNORMAL LOW (ref 150–400)
RBC: 3.62 MIL/uL — ABNORMAL LOW (ref 4.22–5.81)
RDW: 15.4 % (ref 11.5–15.5)
WBC: 9.2 10*3/uL (ref 4.0–10.5)
nRBC: 0 % (ref 0.0–0.2)

## 2022-03-04 LAB — LACTATE DEHYDROGENASE: LDH: 178 U/L (ref 98–192)

## 2022-03-04 NOTE — Progress Notes (Unsigned)
[facility] OFFICE PROGRESS NOTE  Patient Care Team: Leone Haven, MD as PCP - General (Family Medicine) Rockey Situ Kathlene November, MD as PCP - Cardiology (Cardiology) [name], MD as Consulting Physician (Cardiology) Cammie Sickle, MD as Medical Oncologist (Hematology and Oncology) Gloris Ham, RN as Registered Nurse (Oncology)   Cancer Staging  No matching staging information was found for the patient.   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  MARCH 2017- CT N/C/A/P- NED. STOP Ritux; surveillance   # AUG 2019- CT/PET- progression/NO transformation;  # NOV 2019- Progression; started Ibrutinib 420 mg/d. STOPPED in end of feb sec to extreme fatigue/joint pains/cramps  #November 01, 2018-start ibrutinib 280 mg a day; December 20, 2018-discontinue ibrutinib secondary multiple side effects.   #January 03, 2019 start Gazyva + Ven [July]; finished Dec 2020-; started Lawson Fiscal maintenance [200 mg a day; DI-amiodarone]; SEP 2022- congestive heart failure; s/p cardiac cath-noted to have CAD however no stents placed.  2D echo- EF-55%  #Mid March 2021-venetoclax dose reduced to 100 mg [second diarrhea]. HELD in [place] 2023 x3 months-multiple admissions to the hospital/pneumonia infection stroke x 3 m.   # MAY 2023-progression of disease in the neck ches; Nov 18, 2021-RE-started venetoclax;   # JUNE 29th, 2023-progression of disease in the neck- STOP VENAOCLAX;   # JULY 2nd week-START ACALABRUTINIB 100 mg BID [previous intolerance to ibrutinib]    #? 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   Ambulating with a cane.   [name] 76 y.o.  male CLL-high risk currently on venetoclax re-started May 1st, 2023 [facility] on hold for the last 3 months because of multiple hospitalizations]-is here for follow-up/review results of the CT scan of his neck.  Patient states his difficulty swallowing is improved since post antibiotics PCP.  However continues to have lymph nodes in his neck.  Patient denies any worsening lumps or bumps.   Complains of chronic joint pain.  Otherwise no fever no chills.  No nausea no vomiting.  No headache.  No weight loss.  No night sweats.  Patient has not had any further hospitalizations.  \Review of Systems  Constitutional:  Positive for malaise/fatigue. Negative for chills, diaphoresis and fever.  HENT:  Negative for nosebleeds and sore throat.   Eyes:  Negative for double vision.  Respiratory:  Negative for hemoptysis, shortness of breath and wheezing.   Cardiovascular:  Negative for chest pain, palpitations, orthopnea and leg swelling.  Gastrointestinal:  Positive for diarrhea. Negative for abdominal pain, blood in stool, constipation, heartburn, melena, nausea and vomiting.  Genitourinary:  Negative for dysuria, frequency and urgency.  Musculoskeletal:  Positive for back pain, joint pain and myalgias.  Skin: Negative.  Negative for  itching and rash.  Neurological:  Negative for dizziness, tingling, focal weakness and headaches.  Psychiatric/Behavioral:  Negative for depression. The patient is not nervous/anxious and does not have  insomnia.       PAST MEDICAL HISTORY :  Past Medical History:  Diagnosis Date   Anxiety    Arthritis    Atrial fibrillation (HCC)    a. Dx 2013, recurred 02/2014, CHA2DS2VASc = 3 -->placed on Eliquis;  b. 02/2014 Echo: EF 50-55%, mid and apical anterior septum and mid and apical inf septum are abnl, mild to mod Ao sclerosis w/o AS.   Cancer associated pain    Chicken pox    Chronic lymphocytic leukemia (HCC)    a. Dx 02/2014.   CLL (chronic lymphocytic leukemia) (HCC)    Complication of anesthesia    History of  PTSD--do not touch patient when waking up from surgery.   COPD (chronic obstructive pulmonary disease) (HCC)    Coronary artery disease    a. 04/2009 CABG x 3 (LIMA->LAD, VG->OM1, VG->PDA);  b. 09/2009 Cath: occluded VG x 2 w/ patent LIMA and L->R collats. EF 55%, mild antlat HK;  c. 10/2011 MV: EF 53%, no isch/infarct-->low risk.   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   TIA (transient ischemic attack) 11/02/2015    PAST SURGICAL HISTORY :   Past Surgical History:  Procedure Laterality Date   ABDOMINAL HERNIA REPAIR     APPENDECTOMY  06/21/1985   CARDIAC CATHETERIZATION  2010; 2011   ; Dr Kirke Corin   CORONARY ARTERY BYPASS GRAFT  04/2009   "CABG X3"   EP IMPLANTABLE DEVICE N/A 03/03/2016   Procedure: Pacemaker Implant;  Surgeon: Duke Salvia, MD;  [facility] INVASIVE CV LAB;  Service: Cardiovascular;  Laterality: N/A;   FOREIGN BODY REMOVAL  1968   "shrapnel in my tailbone"   INGUINAL HERNIA REPAIR Right    INSERT / REPLACE /  REMOVE PACEMAKER     JOINT REPLACEMENT Right 2018   LAPAROSCOPIC CHOLECYSTECTOMY     RIGHT/LEFT HEART CATH AND CORONARY/GRAFT ANGIOGRAPHY N/A 02/04/2021   Procedure: RIGHT/LEFT HEART CATH AND CORONARY/GRAFT ANGIOGRAPHY;  Surgeon: Yvonne Kendall, MD;  Location: [facility] INVASIVE CV LAB;  Service: Cardiovascular;  Laterality: N/A;   TONSILLECTOMY AND ADENOIDECTOMY  1956   TOTAL HIP ARTHROPLASTY Right 10/22/2016   Procedure: TOTAL HIP ARTHROPLASTY;  Surgeon: Donato Heinz, MD;  Location: [facility] ORS;  Service: Orthopedics;  Laterality: Right;   TOTAL HIP ARTHROPLASTY Left 11/04/2017   Procedure: TOTAL HIP ARTHROPLASTY;  Surgeon: Donato Heinz, MD;  Location: [facility] ORS;  Service: Orthopedics;  Laterality: Left;    FAMILY HISTORY :   Family History  Problem Relation Age of Onset   Heart disease Mother    Heart attack Mother    Coronary artery disease Other        family history    SOCIAL HISTORY:   Social History   Tobacco Use   Smoking status: Former  Packs/day: 1.00    Years: 40.00    Total pack years: 40.00    Types: Cigarettes    Quit date: 07/21/2006    Years since quitting: 15.6   Smokeless tobacco: Never  Vaping Use   Vaping Use: Former  Substance Use Topics   Alcohol use: Not Currently   Drug use: No    ALLERGIES:  has No Known Allergies.  MEDICATIONS:  Current Outpatient Medications  Medication Sig Dispense Refill   acetaminophen (TYLENOL) 325 MG tablet Take 1-2 tablets (325-650 mg total) by mouth every 4 (four) hours as needed for mild pain.     acyclovir (ZOVIRAX) 400 MG tablet TAKE 1 TABLET(400 MG) BY MOUTH TWICE DAILY 60 tablet 6   albuterol (VENTOLIN HFA) 108 (90 Base) MCG/ACT inhaler Inhale 2 puffs into the lungs every 6 (six) hours as needed for wheezing or shortness of breath. 1 each 0   Alirocumab (PRALUENT) 150 MG/ML SOAJ Inject 150 mg into the skin every 14 (fourteen) days. 2 mL 12   amiodarone (PACERONE) 200 MG tablet Take 1/2 tablet (100 mg) by mouth twice  daily 5 days a week 60 tablet 6   apixaban (ELIQUIS) 5 MG TABS tablet Take 5 mg by mouth 2 (two) times daily.     azelastine (ASTELIN) 0.1 % nasal spray Place 2 sprays into both nostrils 2 (two) times daily. Use in each nostril as directed 30 mL 1   budesonide-formoterol (SYMBICORT) 160-4.5 MCG/ACT inhaler Inhale 2 puffs into the lungs 2 (two) times daily. 1 Inhaler 11   cetirizine (ZYRTEC) 10 MG tablet Take 10 mg by mouth daily as needed for allergies.      Coenzyme Q10 (COQ10) 200 MG CAPS Take 200 mg by mouth daily.     diphenoxylate-atropine (LOMOTIL) 2.5-0.025 MG tablet Take 1 tablet by mouth 4 (four) times daily as needed for diarrhea or loose stools. 45 tablet 0   ezetimibe (ZETIA) 10 MG tablet Take 1 tablet (10 mg total) by mouth daily. 90 tablet 3   Krill Oil 350 MG CAPS Take 350 mg by mouth daily as needed (Heart).     mirtazapine (REMERON) 15 MG tablet Take 15 mg by mouth at bedtime as needed (for panic associated with PTSD).      Multiple Vitamin (MULTIVITAMIN WITH MINERALS) TABS tablet Take 1 tablet by mouth daily.     nitroGLYCERIN (NITROSTAT) 0.4 MG SL tablet Place 1 tablet (0.4 mg total) under the tongue every 5 (five) minutes as needed for chest pain. (Patient taking differently: Place 0.4 mg under the tongue every 5 (five) minutes as needed for chest pain. Has not needed) 25 tablet 6   Tiotropium Bromide Monohydrate 2.5 MCG/ACT AERS Inhale 2 puffs into the lungs at bedtime. Spiriva     traMADol (ULTRAM) 50 MG tablet Take 1 tablet (50 mg total) by mouth every 12 (twelve) hours as needed. 60 tablet 0   furosemide (LASIX) 40 MG tablet Take 1 tablet (40 mg total) by mouth daily. (Patient not taking: Reported on 02/04/2022) 30 tablet 0   No current facility-administered medications for this visit.    PHYSICAL EXAMINATION: ECOG PERFORMANCE STATUS: 1 - Symptomatic but completely ambulatory  BP 133/83 (BP Location: Left Arm, Patient Position: Sitting, Cuff Size: Normal)   Pulse 76    Temp (!) 96.7 F (35.9 C) (Tympanic)   Ht 5\' 9"  (1.753 m)   Wt 204 lb 6.4 oz (92.7 kg)   SpO2 100%   BMI 30.18 kg/m  Filed Weights   03/04/22 1447  Weight: 204 lb 6.4 oz (92.7 kg)   1 cm size left neck adenopathy felt/jugulodigastric  Physical Exam HENT:     Head: Normocephalic and atraumatic.     Mouth/Throat:     Pharynx: No oropharyngeal exudate.  Eyes:     Pupils: Pupils are equal, round, and reactive to light.  Cardiovascular:     Rate and Rhythm: Normal rate and regular rhythm.  Pulmonary:     Effort: No respiratory distress.     Breath sounds: No wheezing.  Abdominal:     General: Bowel sounds are normal. There is no distension.     Palpations: Abdomen is soft. There is no mass.     Tenderness: There is no abdominal tenderness. There is no guarding or rebound.  Musculoskeletal:        General: No tenderness. Normal range of motion.     Cervical back: Normal range of motion and neck supple.  Skin:    General: Skin is warm.  Neurological:     Mental Status: He is alert and oriented to person, place, and time.  Psychiatric:        Mood and Affect: Affect normal.     LABORATORY DATA:  I have reviewed the data as listed    Component Value Date/Time   NA 141 03/04/2022 1436   NA 143 03/11/2021 1136   NA 139 10/11/2014 1800   K 3.8 03/04/2022 1436   K 3.3 (L) 10/11/2014 1800   CL 106 03/04/2022 1436   CL 106 10/11/2014 1800   CO2 26 03/04/2022 1436   CO2 27 10/11/2014 1800   GLUCOSE 107 (H) 03/04/2022 1436   GLUCOSE 107 (H) 10/11/2014 1800   BUN 16 03/04/2022 1436   BUN 13 03/11/2021 1136   BUN 15 10/11/2014 1800   CREATININE 0.98 03/04/2022 1436   CREATININE 0.89 10/11/2014 1800   CALCIUM 8.7 (L) 03/04/2022 1436   CALCIUM 8.8 (L) 10/11/2014 1800   PROT 7.5 03/04/2022 1436   PROT 6.7 05/18/2017 1048   PROT 6.4 (L) 10/11/2014 1800   ALBUMIN 3.8 03/04/2022 1436   ALBUMIN 4.3 05/18/2017 1048   ALBUMIN 4.1 10/11/2014 1800   AST 26 03/04/2022 1436    AST 23 10/11/2014 1800   ALT 21 03/04/2022 1436   ALT 22 10/11/2014 1800   ALKPHOS 99 03/04/2022 1436   ALKPHOS 61 10/11/2014 1800   BILITOT 0.7 03/04/2022 1436   BILITOT 0.7 05/18/2017 1048   BILITOT 0.9 10/11/2014 1800   GFRNONAA >60 03/04/2022 1436   GFRNONAA >60 10/11/2014 1800   GFRAA >60 04/12/2020 0959   GFRAA >60 10/11/2014 1800    No results found for: "SPEP", "UPEP"  Lab Results  Component Value Date   WBC 9.2 03/04/2022   NEUTROABS 3.8 03/04/2022   HGB 11.7 (L) 03/04/2022   HCT 36.5 (L) 03/04/2022   MCV 100.8 (H) 03/04/2022   PLT 140 (L) 03/04/2022      Chemistry      Component Value Date/Time   NA 141 03/04/2022 1436   NA 143 03/11/2021 1136   NA 139 10/11/2014 1800   K 3.8 03/04/2022 1436   K 3.3 (L) 10/11/2014 1800   CL 106 03/04/2022 1436   CL 106 10/11/2014 1800   CO2 26 03/04/2022 1436   CO2 27 10/11/2014 1800   BUN 16 03/04/2022 1436   BUN 13 03/11/2021 1136   BUN 15 10/11/2014 1800   CREATININE 0.98 03/04/2022 1436  CREATININE 0.89 10/11/2014 1800      Component Value Date/Time   CALCIUM 8.7 (L) 03/04/2022 1436   CALCIUM 8.8 (L) 10/11/2014 1800   ALKPHOS 99 03/04/2022 1436   ALKPHOS 61 10/11/2014 1800   AST 26 03/04/2022 1436   AST 23 10/11/2014 1800   ALT 21 03/04/2022 1436   ALT 22 10/11/2014 1800   BILITOT 0.7 03/04/2022 1436   BILITOT 0.7 05/18/2017 1048   BILITOT 0.9 10/11/2014 1800       RADIOGRAPHIC STUDIES: I have personally reviewed the radiological images as listed and agreed with the findings in the report. No results found.   ASSESSMENT & PLAN:  No problem-specific Assessment & Plan notes found for this encounter.      No orders of the defined types were placed in this encounter.   All questions were answered. The patient knows to call the clinic with any problems, questions or concerns.      Cammie Sickle, MD 03/04/2022 3:17 PM

## 2022-03-04 NOTE — Progress Notes (Signed)
Remote pacemaker transmission.   

## 2022-03-04 NOTE — Assessment & Plan Note (Signed)
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 MAY 27th, 2023-  Increased adenopathy above the diaphragm with stable prominent/mildly enlarged lymph nodes below the diaphragm and no splenomegaly [however patient has been off venetoclax for 3 months months prior-sec to hospitalizations/etc]. JUNE 29th, 2023- NECK CT- Further progression of adenopathy since 12/12/2021 in the neck and included chest ~lymphadenopathy approximately 1 to 1.2 cm in size previously 0.5 cm.   # DISCONTINUED  Venatoclax given the doubling in size of the lymph nodes/progression is noted on CT scan of the neck. Patient had previous intolerance to ibrutinib.  I would recommend acalabrutinib 100 mg twice a day. START today. Again reviewed the potential side effects including headaches.   # recent C.Diff- colitis- s/p Vancomycin PO -monitor closely.  # MAY 2023-CT scan-incidental-  new scattered bilateral pulmonary nodules measure up to 5 mm and are present on a hazy background of ground-glass opacity, favored to reflect infectious or inflammatory process.  We will repeat imaging of the line.  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   # Low immunoglobulins IgG 330/recent severe respiratory infection/CLL-s/pI IVIG #4/4 [last April 2022].NOV 2022- IgG > 500.  STABLE.   # A.fib-on amiodarone [drug interaction-amiodarone; Dr.Gollan] sinus rhythm-on Eliquis/CHF- lasix 40/d- s/p Cath [AUG, 2022- Echo-55%] -STABLE.  # MIld Anemia Hb ~11-12; STABLE.   # Joint pains/ Muscle pain [improved after stopping statin; shot-ok]- continue tramadol/CBD prn; refilled- -STABLE  DISPOSITION: #  follow up in 1 month-  MD; labs- cbc/cmp; LDH; quantitative immunoglobulins - Dr.B

## 2022-03-04 NOTE — Progress Notes (Signed)
Pt would like to know if he is being put back on treatment, unsure of the name of the medicine.

## 2022-03-04 NOTE — Progress Notes (Signed)
[facility]  [phone] [phone]  Patient Care Team: Leone Haven, MD as PCP - General (Family Medicine) Rockey Situ Kathlene November, MD as PCP - Cardiology (Cardiology) [name], MD as Consulting Physician (Cardiology) Cammie Sickle, MD as Medical Oncologist (Hematology and Oncology) Gloris Ham, RN as Registered Nurse (Oncology)   Name of the patient: [name]  [id number]  1946/07/01   Date of visit: 03/04/22  HPI: Patient is a 76 y.o. male with progressive CLL. Patient was previously treated with venetoclax but will switch to treatment with [place] (acalabrutinib).  Reason for Consult: Acalabrutinib oral chemotherapy education.   PAST MEDICAL HISTORY: Past Medical History:  Diagnosis Date   Anxiety    Arthritis    Atrial fibrillation ([place])    a. Dx 2013, recurred 02/2014, CHA2DS2VASc = 3 -->placed on Eliquis;  b. 02/2014 Echo: EF 50-55%, mid and apical anterior septum and mid and apical inf septum are abnl, mild to mod Ao sclerosis w/o AS.   Cancer associated pain    Chicken pox    Chronic lymphocytic leukemia ([place])    a. Dx 02/2014.   CLL (chronic lymphocytic leukemia) (H[facility])    Complication of anesthesia    History of  PTSD--do not touch patient when waking up from surgery.   COPD (chronic obstructive pulmonary disease) (HCC)    Coronary artery disease    a. 04/2009 CABG x 3 (LIMA->LAD, VG->OM1, VG->PDA);  b. 09/2009 Cath: occluded VG x 2 w/ patent LIMA and L->R collats. EF 55%, mild antlat HK;  c. 10/2011 MV: EF 53%, no isch/infarct-->low risk.   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  MARCH 2017- CT N/C/A/P- NED. STOP Ritux; surveillance   # AUG 2019- CT/PET- progression/NO transformation;  # NOV 2019- Progression; started Ibrutinib 420 mg/d. STOPPED in end of feb sec to extreme fatigue/joint pains/cramps  #November 01, 2018-start ibrutinib 280 mg a day; December 20, 2018-discontinue ibrutinib secondary multiple side effects.   #January 03, 2019 start Gazyva + Ven [July]; finished Dec 2020-; started VLawson Fiscalmaintenance [200 mg a day; DI-amiodarone]; SEP 2022- congestive heart failure; s/p cardiac cath-noted to have CAD however no stents placed.  2D echo- EF-55%  #Mid March 2021-venetoclax dose reduced to 100 mg [second diarrhea]. HELD in [place] x3 months-multiple admissions to the hospital/pneumonia infection stroke x 3 m.   # MAY 2023-progression of disease in the neck ches; Nov 18, 2021-RE-started venetoclax;   # JUNE 29th, 2023-progression of disease in the neck- STOP VENAOCLAX;   # JULY 2nd week-START ACALABRUTINIB 100 mg BID [previous intolerance to ibrutinib]    #? 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  MEDICATIONS:  Current Outpatient Medications  Medication Sig Dispense Refill   acetaminophen (TYLENOL) 325 MG tablet Take 1-2 tablets (325-650 mg total) by mouth every 4 (four) hours as needed for mild pain.     acyclovir (ZOVIRAX) 400 MG tablet TAKE 1 TABLET(400 MG) BY MOUTH TWICE DAILY 60 tablet 6   albuterol (VENTOLIN HFA) 108 (90 Base) MCG/ACT inhaler Inhale 2 puffs into the lungs every 6 (six) hours as needed for wheezing or shortness of breath. 1 each 0   Alirocumab (PRALUENT) 150 MG/ML SOAJ Inject 150 mg into the skin every 14 (fourteen) days. 2 mL 12   amiodarone (PACERONE) 200 MG tablet Take 1/2 tablet (100 mg) by mouth twice daily 5 days a week 60 tablet 6   apixaban (ELIQUIS) 5 MG TABS tablet Take 5 mg by mouth 2 (two) times daily.     azelastine (ASTELIN) 0.1 % nasal spray Place 2 sprays into both nostrils 2 (two) times daily. Use in each nostril as directed 30 mL 1   budesonide-formoterol (SYMBICORT) 160-4.5 MCG/ACT inhaler Inhale 2 puffs into the lungs 2 (two) times daily. 1 Inhaler 11   cetirizine (ZYRTEC) 10 MG tablet Take 10 mg by mouth daily as needed for allergies.      Coenzyme Q10 (COQ10) 200 MG CAPS Take 200 mg by mouth daily.     diphenoxylate-atropine (LOMOTIL) 2.5-0.025 MG tablet Take 1 tablet by mouth 4 (four) times daily as needed for diarrhea or loose stools. 45 tablet 0   ezetimibe (ZETIA) 10 MG tablet Take 1 tablet (10 mg total) by mouth daily. 90 tablet 3   furosemide (LASIX) 40 MG tablet Take 1 tablet (40 mg total) by mouth daily. (Patient not taking: Reported on 02/04/2022) 30 tablet 0   Krill Oil 350 MG CAPS Take 350 mg by mouth daily as needed (Heart).     mirtazapine (REMERON) 15 MG tablet Take 15 mg by mouth at bedtime as needed (for panic associated with PTSD).       Multiple Vitamin (MULTIVITAMIN WITH MINERALS) TABS tablet Take 1 tablet by mouth daily.     nitroGLYCERIN (NITROSTAT) 0.4 MG SL tablet Place 1 tablet (0.4 mg total) under the tongue every 5 (five) minutes as needed for chest pain. (Patient taking differently: Place 0.4 mg under the tongue every 5 (five) minutes as needed for chest pain. Has not needed) 25 tablet 6   Tiotropium Bromide Monohydrate 2.5 MCG/ACT AERS Inhale 2 puffs into the lungs at bedtime. Spiriva     traMADol (ULTRAM) 50 MG tablet Take 1 tablet (50 mg total) by mouth every 12 (twelve) hours as needed. 60 tablet 0   No current facility-administered medications for this visit.    VITAL SIGNS: There were no vitals taken for this visit. There were no vitals filed for this visit.  Estimated body mass index is 30.21 kg/m as calculated from  the following:   Height as of 02/04/22: _0  (1.753 m).   Weight as of 02/04/22: 92.8 kg (204 lb 9.6 oz).  LABS: CBC:    Component Value Date/Time   WBC 9.4 02/04/2022 1117   HGB 11.1 (L) 02/04/2022 1117   HGB 12.5 (L) 03/11/2021 1136   HCT 33.6 (L) 02/04/2022 1117   HCT 36.9 (L) 03/11/2021 1136   PLT 161.0 02/04/2022 1117   PLT 159 03/11/2021 1136   MCV 96.6 02/04/2022 1117   MCV 100 (H) 03/11/2021 1136   MCV 95 10/24/2014 0837   NEUTROABS 6.4 01/27/2022 1846   NEUTROABS 3.1 10/24/2014 0837   LYMPHSABS 1.7 01/27/2022 1846   LYMPHSABS 0.8 (L) 10/24/2014 0837   MONOABS 0.1 01/27/2022 1846   MONOABS 0.6 10/24/2014 0837   EOSABS 0.0 01/27/2022 1846   EOSABS 0.2 10/24/2014 0837   BASOSABS 0.0 01/27/2022 1846   BASOSABS 0.0 10/24/2014 0837   Comprehensive Metabolic Panel:    Component Value Date/Time   NA 143 02/04/2022 1117   NA 143 03/11/2021 1136   NA 139 10/11/2014 1800   K 3.6 02/04/2022 1117   K 3.3 (L) 10/11/2014 1800   CL 110 02/04/2022 1117   CL 106 10/11/2014 1800   CO2 27 02/04/2022 1117   CO2 27 10/11/2014 1800   BUN 15 02/04/2022 1117   BUN 13 03/11/2021 1136    BUN 15 10/11/2014 1800   CREATININE 0.77 02/04/2022 1117   CREATININE 0.89 10/11/2014 1800   GLUCOSE 101 (H) 02/04/2022 1117   GLUCOSE 107 (H) 10/11/2014 1800   CALCIUM 8.4 02/04/2022 1117   CALCIUM 8.8 (L) 10/11/2014 1800   AST 41 01/27/2022 1307   AST 23 10/11/2014 1800   ALT 34 01/27/2022 1307   ALT 22 10/11/2014 1800   ALKPHOS 128 (H) 01/27/2022 1307   ALKPHOS 61 10/11/2014 1800   BILITOT 1.1 01/27/2022 1307   BILITOT 0.7 05/18/2017 1048   BILITOT 0.9 10/11/2014 1800   PROT 7.7 01/27/2022 1307   PROT 6.7 05/18/2017 1048   PROT 6.4 (L) 10/11/2014 1800   ALBUMIN 3.8 01/27/2022 1307   ALBUMIN 4.3 05/18/2017 1048   ALBUMIN 4.1 10/11/2014 1800     Present during today's visit: patient and his daughter  Start plan: He will start acalabrutinib today 03/04/22   Patient Education I spoke with patient for overview of new oral chemotherapy medication: acalabrutinib   Administration: Counseled patient on administration, dosing, side effects, monitoring, drug-food interactions, safe handling, storage, and disposal. Patient will take 1 tablet by mouth 2 (two) times daily.  Side Effects: Side effects include but not limited to: headache, decreased wbc/hgb/plt, diarrhea, fatigue.   Headache: Patient informed that acalabrutinib headaches typically respond well to caffeine. He was instructed to try drinking something with caffeine if a headache occurs. Diarrhea: Patient will use loperamide as needed and call if he is having 4 or more loose stools per day  Drug-drug Interactions (DDI): Apixaban: Acalabrutinib may enhance the anticoagulant effect of anticoagulants. Monitor for signs of bleeding if combined.  Adherence: After discussion with patient no patient barriers to medication adherence identified.  Reviewed with patient importance of keeping a medication schedule and plan for any missed doses.  [name] voiced understanding and appreciation. All questions answered. Medication  handout provided.  Provided patient with [facility] phone number. Patient knows to call the office with questions or concerns. [facility] will continue to follow.  Patient expressed understanding and was in  agreement with this plan. He also understands that He can call clinic at any time with any questions, concerns, or complaints.   Medication Access Issues: No issues, patient was approved for manufacturer assistance  Follow-up plan: RTC in one month   Thank you for allowing me to participate in the care of this patient.   Time Total: 20 mins  Visit consisted of counseling and education on dealing with issues of symptom management in the setting of serious and potentially life-threatening illness.Greater than 50%  of this time was spent counseling and coordinating care related to the above assessment and plan.  Signed by: Darl Pikes, PharmD, BCPS, Salley Slaughter, CPP Hematology/Oncology Clinical Pharmacist Practitioner [place]/DB/AP [facility] [phone]  03/04/2022 1:58 PM

## 2022-03-05 ENCOUNTER — Encounter: Payer: Self-pay | Admitting: Internal Medicine

## 2022-03-10 ENCOUNTER — Inpatient Hospital Stay: Payer: Medicare HMO

## 2022-03-10 ENCOUNTER — Inpatient Hospital Stay (HOSPITAL_BASED_OUTPATIENT_CLINIC_OR_DEPARTMENT_OTHER): Payer: Medicare HMO | Admitting: Hospice and Palliative Medicine

## 2022-03-10 ENCOUNTER — Other Ambulatory Visit: Payer: No Typology Code available for payment source

## 2022-03-10 ENCOUNTER — Telehealth: Payer: Self-pay | Admitting: *Deleted

## 2022-03-10 DIAGNOSIS — E876 Hypokalemia: Secondary | ICD-10-CM

## 2022-03-10 DIAGNOSIS — E860 Dehydration: Secondary | ICD-10-CM | POA: Diagnosis not present

## 2022-03-10 DIAGNOSIS — R197 Diarrhea, unspecified: Secondary | ICD-10-CM

## 2022-03-10 DIAGNOSIS — C9110 Chronic lymphocytic leukemia of B-cell type not having achieved remission: Secondary | ICD-10-CM | POA: Diagnosis not present

## 2022-03-10 LAB — CBC WITH DIFFERENTIAL/PLATELET
Abs Immature Granulocytes: 0.03 10*3/uL (ref 0.00–0.07)
Basophils Absolute: 0 10*3/uL (ref 0.0–0.1)
Basophils Relative: 0 %
Eosinophils Absolute: 0.1 10*3/uL (ref 0.0–0.5)
Eosinophils Relative: 2 %
HCT: 31.8 % — ABNORMAL LOW (ref 39.0–52.0)
Hemoglobin: 10.1 g/dL — ABNORMAL LOW (ref 13.0–17.0)
Immature Granulocytes: 0 %
Lymphocytes Relative: 46 %
Lymphs Abs: 4.3 10*3/uL — ABNORMAL HIGH (ref 0.7–4.0)
MCH: 32.4 pg (ref 26.0–34.0)
MCHC: 31.8 g/dL (ref 30.0–36.0)
MCV: 101.9 fL — ABNORMAL HIGH (ref 80.0–100.0)
Monocytes Absolute: 1 10*3/uL (ref 0.1–1.0)
Monocytes Relative: 10 %
Neutro Abs: 3.9 10*3/uL (ref 1.7–7.7)
Neutrophils Relative %: 42 %
Platelets: 155 10*3/uL (ref 150–400)
RBC: 3.12 MIL/uL — ABNORMAL LOW (ref 4.22–5.81)
RDW: 16.4 % — ABNORMAL HIGH (ref 11.5–15.5)
WBC: 9.3 10*3/uL (ref 4.0–10.5)
nRBC: 0 % (ref 0.0–0.2)

## 2022-03-10 LAB — COMPREHENSIVE METABOLIC PANEL
ALT: 17 U/L (ref 0–44)
AST: 18 U/L (ref 15–41)
Albumin: 3.4 g/dL — ABNORMAL LOW (ref 3.5–5.0)
Alkaline Phosphatase: 86 U/L (ref 38–126)
Anion gap: 8 (ref 5–15)
BUN: 22 mg/dL (ref 8–23)
CO2: 24 mmol/L (ref 22–32)
Calcium: 8.2 mg/dL — ABNORMAL LOW (ref 8.9–10.3)
Chloride: 108 mmol/L (ref 98–111)
Creatinine, Ser: 1.18 mg/dL (ref 0.61–1.24)
GFR, Estimated: >60 mL/min (ref >60)
Glucose, Bld: 119 mg/dL — ABNORMAL HIGH (ref 70–99)
Potassium: 3.2 mmol/L — ABNORMAL LOW (ref 3.5–5.1)
Sodium: 140 mmol/L (ref 135–145)
Total Bilirubin: 0.9 mg/dL (ref 0.3–1.2)
Total Protein: 7.1 g/dL (ref 6.5–8.1)

## 2022-03-10 LAB — LACTATE DEHYDROGENASE: LDH: 114 U/L (ref 98–192)

## 2022-03-10 LAB — MAGNESIUM: Magnesium: 2.2 mg/dL (ref 1.7–2.4)

## 2022-03-10 MED ORDER — POTASSIUM CHLORIDE CRYS ER 10 MEQ PO TBCR: 10.0000 meq | EXTENDED_RELEASE_TABLET | Freq: Every day | ORAL | 0 refills | Status: DC

## 2022-03-10 MED ORDER — SODIUM CHLORIDE 0.9 % IV SOLN
INTRAVENOUS | Status: DC
  Filled 2022-03-10: qty 250

## 2022-03-10 MED ORDER — POTASSIUM CHLORIDE 10 MEQ/100ML IV SOLN
10.0000 meq | Freq: Once | INTRAVENOUS | Status: AC
  Administered 2022-03-10: 10 meq via INTRAVENOUS
  Filled 2022-03-10: qty 100

## 2022-03-10 NOTE — Telephone Encounter (Signed)
Pt has been contacted by H. Rosalee Kaufman and added to smc schedule today.

## 2022-03-10 NOTE — Progress Notes (Signed)
Symptom Management [place] at [facility] [phone]) [phone] [phone]  Patient Care Team: Leone Haven, MD as PCP - General (Family Medicine) [name], MD as PCP - Cardiology (Cardiology) [name], MD as Consulting Physician (Cardiology) Cammie Sickle, MD as Medical Oncologist (Hematology and Oncology) Gloris Ham, RN as Registered Nurse (Oncology)   Name of the patient: [name]  [id number]  07-03-1946   Date of visit: 03/10/22  Reason for Consult: [name] is a 76 y.o. 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   CT of the neck on 01/16/2022 revealed further disease progression with worsening lymphadenopathy on venetoclax.  Patient saw Dr. Rogue Bussing on 03/04/2022.  She was started on acalabrutinib.  Patient presents to [facility] today for evaluation of swollen lymph nodes.  Patient reports that he developed swollen neck lymph nodes bilaterally over the weekend.  He denies any changes in breathing or difficulty swallowing.  No fevers although patient remains cold.  He has had fatigue and poor oral intake.  No pain.  No changes in shortness of breath, cough, or congestion.  No urinary symptoms.  Denies bleeding.  Denies other symptomatic complaints.   PAST MEDICAL HISTORY: Past Medical History:  Diagnosis Date   Anxiety    Arthritis    Atrial fibrillation ([place])    a. Dx 2013, recurred 02/2014, CHA2DS2VASc = 3 -->placed on Eliquis;  b. 02/2014 Echo: EF 50-55%, mid and apical anterior septum and mid and apical inf septum are abnl, mild to mod Ao sclerosis w/o AS.   Cancer associated pain    Chicken pox    Chronic lymphocytic leukemia ([place])    a. Dx 02/2014.   CLL (chronic lymphocytic leukemia) (HCC)    Complication of anesthesia    History of  PTSD--do not  touch patient when waking up from surgery.   COPD (chronic obstructive pulmonary disease) (HCC)    Coronary artery disease    a. 04/2009 CABG x 3 (LIMA->LAD, VG->OM1, VG->PDA);  b. 09/2009 Cath: occluded VG x 2 w/ patent LIMA and L->R collats. EF 55%, mild antlat HK;  c. 10/2011 MV: EF 53%, no isch/infarct-->low risk.   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   TIA (transient ischemic attack) 11/02/2015    PAST SURGICAL HISTORY:  Past Surgical History:  Procedure Laterality Date   ABDOMINAL HERNIA REPAIR     APPENDECTOMY  06/21/1985   CARDIAC CATHETERIZATION  2010; 2011   ; Dr Fletcher Anon   CORONARY ARTERY BYPASS GRAFT  04/2009   "CABG X3"   EP IMPLANTABLE DEVICE N/A 03/03/2016   Procedure: Pacemaker Implant;  Surgeon: Deboraha Sprang, MD;  Location: [place] [facility];  Service: Cardiovascular;  Laterality: N/A;   FOREIGN BODY REMOVAL  1968   "shrapnel in my tailbone"   INGUINAL HERNIA REPAIR Right  INSERT / REPLACE / REMOVE PACEMAKER     JOINT REPLACEMENT Right 2018   LAPAROSCOPIC CHOLECYSTECTOMY     RIGHT/LEFT HEART CATH AND CORONARY/GRAFT ANGIOGRAPHY N/A 02/04/2021   Procedure: RIGHT/LEFT HEART CATH AND CORONARY/GRAFT ANGIOGRAPHY;  Surgeon: Nelva Bush, MD;  Location: [place] [facility];  Service: Cardiovascular;  Laterality: N/A;   TONSILLECTOMY AND ADENOIDECTOMY  1956   TOTAL HIP ARTHROPLASTY Right 10/22/2016   Procedure: TOTAL HIP ARTHROPLASTY;  Surgeon: Dereck Leep, MD;  Location: [facility] OR[facility];  Service: Orthopedics;  Laterality: Right;    TOTAL HIP ARTHROPLASTY Left 11/04/2017   Procedure: TOTAL HIP ARTHROPLASTY;  Surgeon: Dereck Leep, MD;  Location: [facility] OR[facility];  Service: Orthopedics;  Laterality: Left;    HEMATOLOGY/ONCOLOGY HISTORY:  Oncology History Overview Note  # AUG 2015- SLL/CLL [Right Ax Ln Bx] s/p Benda-Rituxan x6 [finished March 2016]; Maintenance Rituxan q 73m [started April 2016; Dr.Pandit];Last Ritux Jan 2017.  MARCH 2017- CT N/C/A/P- NED. STOP Ritux; surveillance   # AUG 2019- CT/PET- progression/NO transformation;  # NOV 2019- Progression; started Ibrutinib 420 mg/d. STOPPED in end of feb sec to extreme fatigue/joint pains/cramps  #November 01, 2018-start ibrutinib 280 mg a day; December 20, 2018-discontinue ibrutinib secondary multiple side effects.   #January 03, 2019 start Gazyva + Ven [July]; finished Dec 2020-; started Lawson Fiscal maintenance [200 mg a day; DI-amiodarone]; SEP 2022- congestive heart failure; s/p cardiac cath-noted to have CAD however no stents placed.  2D echo- EF-55%  #Mid March 2021-venetoclax dose reduced to 100 mg [second diarrhea]. HELD in [place] 2023 x3 months-multiple admissions to the hospital/pneumonia infection stroke x 3 m.   # MAY 2023-progression of disease in the neck ches; Nov 18, 2021-RE-started venetoclax;   # JUNE 29th, 2023-progression of disease in the neck- STOP VENAOCLAX;   # AUG 16th, 2023 START ACALABRUTINIB 100 mg BID [previous intolerance to ibrutinib; delayed sec to C.diff]    #? 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)  01/03/2019 - 06/02/2019 Chemotherapy   Patient is on Treatment Plan : LEUKEMIA CLL Obinutuzumab q28d       ALLERGIES:  has No Known Allergies.  MEDICATIONS:  Current Outpatient Medications  Medication Sig Dispense Refill   acetaminophen (TYLENOL) 325 MG tablet Take 1-2 tablets (325-650 mg total) by mouth every 4 (four) hours as needed for mild pain.     acyclovir (ZOVIRAX) 400 MG tablet TAKE 1 TABLET(400 MG) BY MOUTH TWICE DAILY 60 tablet 6   albuterol (VENTOLIN HFA) 108 (90 Base) MCG/ACT inhaler Inhale 2 puffs into the lungs every 6 (six) hours as needed for wheezing or shortness of breath. 1 each 0   Alirocumab (PRALUENT) 150 MG/ML SOAJ Inject 150 mg into the skin every 14 (fourteen) days. 2 mL 12   amiodarone (PACERONE) 200 MG tablet Take 1/2 tablet (100 mg) by mouth twice daily 5 days a week 60 tablet 6   apixaban (ELIQUIS) 5 MG TABS tablet Take 5 mg by mouth 2 (two) times daily.     azelastine (ASTELIN) 0.1 % nasal  spray Place 2 sprays into both nostrils 2 (two) times daily. Use in each nostril as directed 30 mL 1   budesonide-formoterol (SYMBICORT) 160-4.5 MCG/ACT inhaler Inhale 2 puffs into the lungs 2 (two) times daily. 1 Inhaler 11   cetirizine (ZYRTEC) 10 MG tablet Take 10 mg by mouth daily as needed for allergies.      Coenzyme Q10 (COQ10) 200 MG CAPS Take 200 mg by mouth daily.     diphenoxylate-atropine (LOMOTIL) 2.5-0.025 MG tablet Take 1 tablet by mouth 4 (four) times daily as needed for diarrhea or loose stools. 45 tablet 0   ezetimibe (ZETIA) 10 MG tablet Take 1 tablet (10 mg total) by mouth daily. 90 tablet 3   furosemide (LASIX) 40 MG tablet Take 1 tablet (40 mg total) by mouth daily. (Patient not taking: Reported on 02/04/2022) 30 tablet 0   Krill Oil 350 MG CAPS Take 350 mg by mouth daily as needed (Heart).     mirtazapine (REMERON) 15 MG tablet Take 15 mg by mouth at bedtime as needed (for panic associated with PTSD).       Multiple Vitamin (MULTIVITAMIN WITH MINERALS) TABS tablet Take 1 tablet by mouth daily.     nitroGLYCERIN (NITROSTAT) 0.4 MG SL tablet Place 1 tablet (0.4 mg total) under the tongue every 5 (five) minutes as needed for chest pain. (Patient taking differently: Place 0.4 mg under the tongue every 5 (five) minutes as needed for chest pain. Has not needed) 25 tablet 6   Tiotropium Bromide Monohydrate 2.5 MCG/ACT AERS Inhale 2 puffs into the lungs at bedtime. Spiriva     traMADol (ULTRAM) 50 MG tablet Take 1 tablet (50 mg total) by mouth every 12 (twelve) hours as needed. 60 tablet 0   No current facility-administered medications for this visit.    VITAL SIGNS: There were no vitals taken for this visit. There were no vitals filed for this visit.  Estimated body mass index is 30.18 kg/m as calculated from the following:   Height as of 03/04/22: 5\' 9"  (1.753 m).   Weight as of 03/04/22: 204 lb 6.4 oz (92.7 kg).  LABS: CBC:    Component Value Date/Time   WBC 9.2 03/04/2022 1436   HGB 11.7 (L) 03/04/2022 1436   HGB 12.5 (L) 03/11/2021 1136   HCT 36.5 (L) 03/04/2022 1436   HCT 36.9 (L) 03/11/2021 1136   PLT 140 (L) 03/04/2022 1436   PLT 159 03/11/2021 1136   MCV 100.8 (H) 03/04/2022 1436   MCV 100 (H) 03/11/2021 1136   MCV 95 10/24/2014 0837   NEUTROABS 3.8 03/04/2022 1436   NEUTROABS 3.1 10/24/2014 0837   LYMPHSABS 3.8 03/04/2022 1436   LYMPHSABS 0.8 (L) 10/24/2014 0837   MONOABS 0.9 03/04/2022 1436   MONOABS 0.6 10/24/2014 0837   EOSABS 0.7 (H) 03/04/2022 1436   EOSABS 0.2 10/24/2014 0837   BASOSABS 0.1 03/04/2022 1436   BASOSABS 0.0 10/24/2014 0837   Comprehensive Metabolic Panel:    Component Value Date/Time   NA 141 03/04/2022 1436   NA 143 03/11/2021 1136   NA 139 10/11/2014 1800   K 3.8 03/04/2022 1436   K 3.3 (L) 10/11/2014 1800   CL 106 03/04/2022 1436   CL 106 10/11/2014 1800   CO2 26 03/04/2022 1436   CO2 27 10/11/2014 1800   BUN 16 03/04/2022 1436   BUN 13  03/11/2021 1136   BUN 15 10/11/2014 1800   CREATININE 0.98 03/04/2022 1436   CREATININE 0.89 10/11/2014 1800  GLUCOSE 107 (H) 03/04/2022 1436   GLUCOSE 107 (H) 10/11/2014 1800   CALCIUM 8.7 (L) 03/04/2022 1436   CALCIUM 8.8 (L) 10/11/2014 1800   AST 26 03/04/2022 1436   AST 23 10/11/2014 1800   ALT 21 03/04/2022 1436   ALT 22 10/11/2014 1800   ALKPHOS 99 03/04/2022 1436   ALKPHOS 61 10/11/2014 1800   BILITOT 0.7 03/04/2022 1436   BILITOT 0.7 05/18/2017 1048   BILITOT 0.9 10/11/2014 1800   PROT 7.5 03/04/2022 1436   PROT 6.7 05/18/2017 1048   PROT 6.4 (L) 10/11/2014 1800   ALBUMIN 3.8 03/04/2022 1436   ALBUMIN 4.3 05/18/2017 1048   ALBUMIN 4.1 10/11/2014 1800    RADIOGRAPHIC STUDIES: No results found.  PERFORMANCE STATUS (ECOG) : 3 - Symptomatic, >50% confined to bed  Review of Systems Unless otherwise noted, a complete review of systems is negative.  Physical Exam General: NAD Cardiovascular: regular rate and rhythm Pulmonary: clear ant fields Abdomen: soft, nontender, + bowel sounds GU: no suprapubic tenderness Extremities: no edema, no joint deformities Skin: no rashes Neurological: Weakness but otherwise nonfocal  Assessment and Plan- Patient is a 76 y.o. 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    Bilateral cervical lymphadenopathy -discussed with Dr. Rogue Bussing and likely this is stemming from initiation of [place].  Patient is not having changes to breathing or difficulty swallowing.  Recommendation is to monitor and provide supportive care for now.  If symptoms worsening, can consider repeat imaging or adjustment to treatment regimen.  Patient verbalized agreement with plan.  Dehydration/hypokalemia -proceed with IV fluids and IV repletion today.  Start oral K.  Referral to nutrition  Repeat labs/possible fluids next week  Case and plan discussed with  Dr. Rogue Bussing   Patient expressed understanding and was in agreement with this plan. He also understands that He can call clinic at any time with any questions, concerns, or complaints.   Thank you for allowing me to participate in the care of this very pleasant patient.   Time Total: 20 minutes  Visit consisted of counseling and education dealing with the complex and emotionally intense issues of symptom management in the setting of serious illness.Greater than 50%  of this time was spent counseling and coordinating care related to the above assessment and plan.  Signed by: Altha Harm, PhD, NP-C

## 2022-03-10 NOTE — Telephone Encounter (Signed)
Patient called reporting tat he as had swelling in "glands on both sides of my neck since Saturday" He is asking for advice. Please advise

## 2022-03-10 NOTE — Telephone Encounter (Signed)
Per Marquis Lunch, PA, "[facility] today or tomorrow"

## 2022-03-17 ENCOUNTER — Inpatient Hospital Stay: Payer: Medicare HMO

## 2022-03-17 VITALS — BP 90/60 | HR 80 | Temp 96.1°F | Resp 18

## 2022-03-17 DIAGNOSIS — C9110 Chronic lymphocytic leukemia of B-cell type not having achieved remission: Secondary | ICD-10-CM

## 2022-03-17 DIAGNOSIS — E860 Dehydration: Secondary | ICD-10-CM

## 2022-03-17 DIAGNOSIS — R197 Diarrhea, unspecified: Secondary | ICD-10-CM

## 2022-03-17 DIAGNOSIS — E876 Hypokalemia: Secondary | ICD-10-CM

## 2022-03-17 LAB — BASIC METABOLIC PANEL
Anion gap: 6 (ref 5–15)
BUN: 20 mg/dL (ref 8–23)
CO2: 26 mmol/L (ref 22–32)
Calcium: 8.5 mg/dL — ABNORMAL LOW (ref 8.9–10.3)
Chloride: 108 mmol/L (ref 98–111)
Creatinine, Ser: 0.85 mg/dL (ref 0.61–1.24)
GFR, Estimated: >60 mL/min (ref >60)
Glucose, Bld: 117 mg/dL — ABNORMAL HIGH (ref 70–99)
Potassium: 3.7 mmol/L (ref 3.5–5.1)
Sodium: 140 mmol/L (ref 135–145)

## 2022-03-17 LAB — CBC WITH DIFFERENTIAL/PLATELET
Abs Immature Granulocytes: 0.08 10*3/uL — ABNORMAL HIGH (ref 0.00–0.07)
Basophils Absolute: 0.1 10*3/uL (ref 0.0–0.1)
Basophils Relative: 1 %
Eosinophils Absolute: 0.5 10*3/uL (ref 0.0–0.5)
Eosinophils Relative: 5 %
HCT: 34.8 % — ABNORMAL LOW (ref 39.0–52.0)
Hemoglobin: 11.2 g/dL — ABNORMAL LOW (ref 13.0–17.0)
Immature Granulocytes: 1 %
Lymphocytes Relative: 50 %
Lymphs Abs: 4.9 10*3/uL — ABNORMAL HIGH (ref 0.7–4.0)
MCH: 32.7 pg (ref 26.0–34.0)
MCHC: 32.2 g/dL (ref 30.0–36.0)
MCV: 101.5 fL — ABNORMAL HIGH (ref 80.0–100.0)
Monocytes Absolute: 0.5 10*3/uL (ref 0.1–1.0)
Monocytes Relative: 5 %
Neutro Abs: 3.6 10*3/uL (ref 1.7–7.7)
Neutrophils Relative %: 38 %
Platelets: 217 10*3/uL (ref 150–400)
RBC: 3.43 MIL/uL — ABNORMAL LOW (ref 4.22–5.81)
RDW: 15.7 % — ABNORMAL HIGH (ref 11.5–15.5)
WBC: 9.6 10*3/uL (ref 4.0–10.5)
nRBC: 0 % (ref 0.0–0.2)

## 2022-03-17 LAB — MAGNESIUM: Magnesium: 2.2 mg/dL (ref 1.7–2.4)

## 2022-03-17 MED ORDER — SODIUM CHLORIDE 0.9 % IV SOLN
Freq: Once | INTRAVENOUS | Status: AC
  Filled 2022-03-17: qty 250

## 2022-03-17 NOTE — Progress Notes (Signed)
Electrolytes WNL. 1 L NS over 1 hour per MD note. Peripheral IV in Left AC. Pt eating chips and crackers. He reports that he is feeling better.

## 2022-03-18 ENCOUNTER — Other Ambulatory Visit: Payer: Self-pay | Admitting: Cardiovascular Disease

## 2022-03-25 ENCOUNTER — Ambulatory Visit (INDEPENDENT_AMBULATORY_CARE_PROVIDER_SITE_OTHER): Payer: Medicare HMO | Admitting: Internal Medicine

## 2022-03-25 ENCOUNTER — Encounter: Payer: Self-pay | Admitting: Internal Medicine

## 2022-03-25 VITALS — BP 128/78 | HR 83 | Temp 97.7°F | Ht 69.0 in | Wt 203.4 lb

## 2022-03-25 DIAGNOSIS — G4733 Obstructive sleep apnea (adult) (pediatric): Secondary | ICD-10-CM

## 2022-03-25 DIAGNOSIS — J449 Chronic obstructive pulmonary disease, unspecified: Secondary | ICD-10-CM

## 2022-03-25 NOTE — Progress Notes (Signed)
SYNOPSIS 76 year old male, former smoker. PMH significant for COPD, OSA. Former patient of Dr. Alva Garnet, last seen 01/18/2018. Maintained on CPAP for sleep apnea. Continue Symbicort 160 and Spiriva.     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 Probable mild obstruction (FEV1 70% pred) with gas trapping (RV 134% pred) and mild-mod decrease in DLCO PSG (07/25/15): Overall AHI < 10/hr but > 100/hr in supine position. ROV (07/30/15): Dyspnea on exertion is moderately improved with scheduled use of Symbicort. He is also on Spiriva, PRN albuterol with rare use of albuterol MDI. PSG results not available. Continue same COPD regimen CPAP titration (08/29/15): recommend full mask with CPAP 8 cm H2O CPAP compliance 11/26-12/25/18: Usage 30/30. Greater than 4 hrs: 27/30.     CC Follow-up COPD Follow-up OSA    HPI Follow-up COPD No recent COPD exacerbations or bronchitis at this time He is Symbicort and Spiriva daily Intermittent albuterol use  No exacerbation at this time No evidence of heart failure at this time No evidence or signs of infection at this time No respiratory distress No fevers, chills, nausea, vomiting, diarrhea No evidence of lower extremity edema No evidence hemoptysis    Follow-up OSA 5-6 out of sleep at night Fullface mask Previous download showed excellent compliance sleep apnea well-controlled Benefits from CPAP therapy Excellent compliance with CPAP machine and therapy 100% compliance for days and greater than 4 hours CPAP 8 cm of water pressure AHI 4.8     Patient with atrial fibrillation status post pacemaker Continue amiodarone therapy Continue oral anticoagulation therapy Recommend obtaining pulmonary function testing  to assess for restrictive lung disease   Counseling given: Not Answered   Outpatient Medications Prior to Visit  Medication Sig Dispense Refill   acalabrutinib maleate 100 MG TABS Take 1 tablet by mouth 2 (two) times daily.     acetaminophen (TYLENOL) 325 MG tablet Take 1-2 tablets (325-650 mg total) by mouth every 4 (four) hours as needed for mild pain.     acyclovir (ZOVIRAX) 400 MG tablet TAKE 1 TABLET(400 MG) BY MOUTH TWICE DAILY 60 tablet 6   albuterol (VENTOLIN HFA) 108 (90 Base) MCG/ACT inhaler Inhale 2 puffs into the lungs every 6 (six) hours as needed for wheezing or shortness of breath. 1 each 0   amiodarone (PACERONE) 200 MG tablet Take 1/2 tablet (100 mg) by mouth twice daily 5 days a week 60 tablet 6   apixaban (ELIQUIS) 5 MG TABS tablet Take 5 mg by mouth 2 (two) times daily.     azelastine (ASTELIN) 0.1 % nasal spray Place 2 sprays into both nostrils 2 (two) times daily. Use in each nostril as directed 30 mL 1   budesonide-formoterol (SYMBICORT) 160-4.5 MCG/ACT inhaler Inhale 2 puffs into the lungs 2 (two) times daily. 1 Inhaler 11   cetirizine (ZYRTEC) 10 MG tablet Take 10 mg by mouth daily as needed for allergies.      Coenzyme Q10 (COQ10) 200 MG CAPS Take 200 mg by mouth daily.     diphenoxylate-atropine (LOMOTIL) 2.5-0.025 MG tablet Take 1 tablet by mouth 4 (four) times daily as needed for diarrhea or loose stools. 45 tablet 0   ezetimibe (ZETIA) 10 MG tablet Take 1 tablet (10 mg total) by mouth daily. [place]  tablet 3   furosemide (LASIX) 40 MG tablet Take 1 tablet (40 mg total) by mouth daily. (Patient not taking: Reported on 02/04/2022) 30 tablet 0   Krill Oil 350 MG CAPS Take 350 mg by mouth daily as needed (Heart).     mirtazapine (REMERON) 15 MG tablet Take 15 mg by mouth at bedtime as needed (for panic associated with PTSD).      Multiple Vitamin (MULTIVITAMIN WITH MINERALS) TABS tablet Take 1 tablet by mouth daily.     nitroGLYCERIN (NITROSTAT) 0.4 MG SL tablet Place 1  tablet (0.4 mg total) under the tongue every 5 (five) minutes as needed for chest pain. (Patient taking differently: Place 0.4 mg under the tongue every 5 (five) minutes as needed for chest pain. Has not needed) 25 tablet 6   potassium chloride (KLOR-CON M) 10 MEQ tablet Take 1 tablet (10 mEq total) by mouth daily. 10 tablet 0   PRALUENT 150 MG/ML SOAJ INJECT 150 MG UNDER THE SKIN EVERY 14 DAYS 2 mL 2   Tiotropium Bromide Monohydrate 2.5 MCG/ACT AERS Inhale 2 puffs into the lungs at bedtime. Spiriva     traMADol (ULTRAM) 50 MG tablet Take 1 tablet (50 mg total) by mouth every 12 (twelve) hours as needed. 60 tablet 0   No facility-administered medications prior to visit.     Review of Systems: Gen:  Denies  fever, sweats, chills weight loss  HEENT: Denies blurred vision, double vision, ear pain, eye pain, hearing loss, nose bleeds, sore throat Cardiac:  No dizziness, chest pain or heaviness, chest tightness,edema, No JVD Resp:   No cough, -sputum production, -shortness of breath,-wheezing, -hemoptysis,  Other:  All other systems negative   BP 128/78 (BP Location: Left Arm, Cuff Size: Normal)   Pulse 83   Temp 97.7 F (36.5 C) (Temporal)   Ht '5\' 9"'$  (1.753 m)   Wt 203 lb 6.4 oz (92.3 kg)   SpO2 97%   BMI 30.04 kg/m   Physical Examination:   General Appearance: No distress  EYES PERRLA, EOM intact.   NECK Supple, No JVD Pulmonary: normal breath sounds, No wheezing.  CardiovascularNormal S1,S2.  No m/r/g.   Abdomen: Benign, Soft, non-tender. ALL OTHER ROS ARE NEGATIVE     Lab Results:  CBC    Component Value Date/Time   WBC 9.6 03/17/2022 1247   RBC 3.43 (L) 03/17/2022 1247   HGB 11.2 (L) 03/17/2022 1247   HGB 12.5 (L) 03/11/2021 1136   HCT 34.8 (L) 03/17/2022 1247   HCT 36.9 (L) 03/11/2021 1136   PLT 217 03/17/2022 1247   PLT 159 03/11/2021 1136   MCV 101.5 (H) 03/17/2022 1247   MCV 100 (H) 03/11/2021 1136   MCV 95 10/24/2014 0837   MCH 32.7 03/17/2022 1247   MCHC  32.2 03/17/2022 1247   RDW 15.7 (H) 03/17/2022 1247   RDW 13.5 03/11/2021 1136   RDW 14.6 (H) 10/24/2014 0837   LYMPHSABS 4.9 (H) 03/17/2022 1247   LYMPHSABS 0.8 (L) 10/24/2014 0837   MONOABS 0.5 03/17/2022 1247   MONOABS 0.6 10/24/2014 0837   EOSABS 0.5 03/17/2022 1247   EOSABS 0.2 10/24/2014 0837   BASOSABS 0.1 03/17/2022 1247   BASOSABS 0.0 10/24/2014 0837    BMET    Component Value Date/Time   NA 140 03/17/2022 1247   NA 143 03/11/2021 1136   NA 139 10/11/2014 1800   K 3.7 03/17/2022 1247   K 3.3 (L) 10/11/2014 1800   CL 108 03/17/2022 1247  CL 106 10/11/2014 1800   CO2 26 03/17/2022 1247   CO2 27 10/11/2014 1800   GLUCOSE 117 (H) 03/17/2022 1247   GLUCOSE 107 (H) 10/11/2014 1800   BUN 20 03/17/2022 1247   BUN 13 03/11/2021 1136   BUN 15 10/11/2014 1800   CREATININE 0.85 03/17/2022 1247   CREATININE 0.89 10/11/2014 1800   CALCIUM 8.5 (L) 03/17/2022 1247   CALCIUM 8.8 (L) 10/11/2014 1800   GFRNONAA >60 03/17/2022 1247   GFRNONAA >60 10/11/2014 1800   GFRAA >60 04/12/2020 0959   GFRAA >60 10/11/2014 1800      ASSESSMENT AND PLAN 76 year old male, former smoker.  Follow-up COPD and follow-up OSA    COPD  Stable at this time  Continue inhalers as prescribed  Previous admission for COVID in January 2023  No symptoms of exacerbation at this time    OSA on CPAP Patient has excellent compliance report with CPAP of 8 cm of water pressure Patient uses and benefits from CPAP therapy OSA is well controlled and reduced Patient uses nasal pillows    A. fib RVR with amiodarone therapy Recommend obtaining pulmonary function testing to assess for restrictive lung disease Follow-up cardiology as scheduled      MEDICATION ADJUSTMENTS/LABS AND TESTS ORDERED: Continue inhalers as prescribed EXCELLENT JOB A+ !!!! Continue CPAP as prescribed Follow-up cardiology as scheduled  CURRENT MEDICATIONS REVIEWED AT LENGTH WITH PATIENT TODAY   Patient satisfied with  Plan of action and management. All questions answered  Follow-up in 1 year  Total time spent 22 minutes  Marka Treloar Patricia Pesa, M.D.  [facility]  Medical Director [place] Director [facility] Cardio-Pulmonary Department

## 2022-03-25 NOTE — Patient Instructions (Addendum)
Continue inhalers as prescribed EXCELLENT JOB A+ !!!! Continue CPAP as prescribed Follow-up cardiology as scheduled

## 2022-04-02 ENCOUNTER — Telehealth: Payer: Self-pay | Admitting: Internal Medicine

## 2022-04-02 NOTE — Telephone Encounter (Signed)
Called patient to reschedule appointment and verify time and date.

## 2022-04-04 ENCOUNTER — Ambulatory Visit: Payer: No Typology Code available for payment source | Admitting: Internal Medicine

## 2022-04-04 ENCOUNTER — Other Ambulatory Visit: Payer: No Typology Code available for payment source

## 2022-04-07 ENCOUNTER — Encounter: Payer: Self-pay | Admitting: Family Medicine

## 2022-04-07 ENCOUNTER — Ambulatory Visit (INDEPENDENT_AMBULATORY_CARE_PROVIDER_SITE_OTHER): Payer: Medicare HMO | Admitting: Family Medicine

## 2022-04-07 VITALS — BP 110/70 | HR 80 | Temp 98.1°F | Ht 69.0 in | Wt 206.4 lb

## 2022-04-07 DIAGNOSIS — C9110 Chronic lymphocytic leukemia of B-cell type not having achieved remission: Secondary | ICD-10-CM | POA: Diagnosis not present

## 2022-04-07 DIAGNOSIS — T148XXD Other injury of unspecified body region, subsequent encounter: Secondary | ICD-10-CM

## 2022-04-07 DIAGNOSIS — S0091XA Abrasion of unspecified part of head, initial encounter: Secondary | ICD-10-CM

## 2022-04-07 DIAGNOSIS — W57XXXA Bitten or stung by nonvenomous insect and other nonvenomous arthropods, initial encounter: Secondary | ICD-10-CM | POA: Insufficient documentation

## 2022-04-07 DIAGNOSIS — T148XXA Other injury of unspecified body region, initial encounter: Secondary | ICD-10-CM | POA: Insufficient documentation

## 2022-04-07 DIAGNOSIS — Z23 Encounter for immunization: Secondary | ICD-10-CM

## 2022-04-07 DIAGNOSIS — E785 Hyperlipidemia, unspecified: Secondary | ICD-10-CM | POA: Diagnosis not present

## 2022-04-07 DIAGNOSIS — W57XXXD Bitten or stung by nonvenomous insect and other nonvenomous arthropods, subsequent encounter: Secondary | ICD-10-CM

## 2022-04-07 DIAGNOSIS — I480 Paroxysmal atrial fibrillation: Secondary | ICD-10-CM

## 2022-04-07 DIAGNOSIS — A0472 Enterocolitis due to Clostridium difficile, not specified as recurrent: Secondary | ICD-10-CM

## 2022-04-07 NOTE — Assessment & Plan Note (Signed)
These have improved with cooler weather.  He will monitor.

## 2022-04-07 NOTE — Assessment & Plan Note (Signed)
Patient seems to have mildly bumped his head on a cabinet and has superficial abrasion.  He will monitor for any headache or other symptoms that would indicate a neurological issue.

## 2022-04-07 NOTE — Assessment & Plan Note (Signed)
Patient will continue management by his oncologist.

## 2022-04-07 NOTE — Patient Instructions (Signed)
Nice to see you. If you have any recurrent diarrhea up let us know.  If you start having headaches or other new symptoms please let us know.

## 2022-04-07 NOTE — Assessment & Plan Note (Signed)
Patient will continue Eliquis 5 mg twice daily.  Amiodarone is managed by cardiology.

## 2022-04-07 NOTE — Progress Notes (Signed)
[name], MD Phone: [phone]  [name] is a 76 y.o. male who presents today for f/u.  HYPERLIPIDEMIA Symptoms Chest pain on exertion:  no   Medications: Compliance- taking praluent and zetia Right upper quadrant pain- no  Muscle aches- no Lipid Panel     Component Value Date/Time   CHOL 79 10/19/2021 0532   CHOL 113 05/18/2017 1048   CHOL 125 03/08/2014 0451   TRIG 77 10/19/2021 0532   TRIG 119 03/08/2014 0451   TRIG 95 08/01/2009 0000   HDL 37 (L) 10/19/2021 0532   HDL 47 05/18/2017 1048   HDL 31 (L) 03/08/2014 0451   CHOLHDL 2.1 10/19/2021 0532   VLDL 15 10/19/2021 0532   VLDL 24 03/08/2014 0451   LDLCALC 27 10/19/2021 0532   LDLCALC 53 05/18/2017 1048   LDLCALC 70 03/08/2014 0451   LABVLDL 13 05/18/2017 1048   Atrial fibrillation: Patient is on amiodarone and Eliquis.  No chest pain or palpitations.  History of C. difficile colitis: Patient has not had any recurrent diarrhea.  He feels as though his strength has come back adequately.  He feels much better than his last visit.  Patient notes he is back on a cancer medication.  He has generally tolerated this well.  He did have a little bit of a headache to start with on this though that has resolved.  Bug bites: Patient notes over the summer he has had numerous bug bites on his legs after he goes outside.  He notes those have been improving as the weather has gotten cooler.  He notes no bedbugs at home.  Patient notes he has an abrasion on his forehead from where he stood up and hit his head on a cabinet.  He notes no fall or headaches.  No loss of consciousness with this.  No significant head injury.  Notes this occurred a number of days ago.  Social History   Tobacco Use  Smoking Status Former   Packs/day: 1.00   Years: 40.00   Total pack years: 40.00   Types: Cigarettes   Quit date: 07/21/2006   Years since quitting: 15.7  Smokeless Tobacco Never    Current Outpatient Medications on File  Prior to Visit  Medication Sig Dispense Refill   acalabrutinib maleate 100 MG TABS Take 1 tablet by mouth 2 (two) times daily.     acetaminophen (TYLENOL) 325 MG tablet Take 1-2 tablets (325-650 mg total) by mouth every 4 (four) hours as needed for mild pain.     acyclovir (ZOVIRAX) 400 MG tablet TAKE 1 TABLET(400 MG) BY MOUTH TWICE DAILY 60 tablet 6   albuterol (VENTOLIN HFA) 108 (90 Base) MCG/ACT inhaler Inhale 2 puffs into the lungs every 6 (six) hours as needed for wheezing or shortness of breath. 1 each 0   amiodarone (PACERONE) 200 MG tablet Take 1/2 tablet (100 mg) by mouth twice daily 5 days a week 60 tablet 6   apixaban (ELIQUIS) 5 MG TABS tablet Take 5 mg by mouth 2 (two) times daily.     azelastine (ASTELIN) 0.1 % nasal spray Place 2 sprays into both nostrils 2 (two) times daily. Use in each nostril as directed 30 mL 1   budesonide-formoterol (SYMBICORT) 160-4.5 MCG/ACT inhaler Inhale 2 puffs into the lungs 2 (two) times daily. 1 Inhaler 11   cetirizine (ZYRTEC) 10 MG tablet Take 10 mg by mouth daily as needed for allergies.      Coenzyme Q10 (COQ10) 200 MG CAPS Take  200 mg by mouth daily.     diphenoxylate-atropine (LOMOTIL) 2.5-0.025 MG tablet Take 1 tablet by mouth 4 (four) times daily as needed for diarrhea or loose stools. 45 tablet 0   ezetimibe (ZETIA) 10 MG tablet Take 1 tablet (10 mg total) by mouth daily. 90 tablet 3   furosemide (LASIX) 40 MG tablet Take 1 tablet (40 mg total) by mouth daily. 30 tablet 0   Krill Oil 350 MG CAPS Take 350 mg by mouth daily as needed (Heart).     mirtazapine (REMERON) 15 MG tablet Take 15 mg by mouth at bedtime as needed (for panic associated with PTSD).      Multiple Vitamin (MULTIVITAMIN WITH MINERALS) TABS tablet Take 1 tablet by mouth daily.     nitroGLYCERIN (NITROSTAT) 0.4 MG SL tablet Place 1 tablet (0.4 mg total) under the tongue every 5 (five) minutes as needed for chest pain. (Patient taking differently: Place 0.4 mg under the tongue  every 5 (five) minutes as needed for chest pain. Has not needed) 25 tablet 6   potassium chloride (KLOR-CON M) 10 MEQ tablet Take 1 tablet (10 mEq total) by mouth daily. 10 tablet 0   PRALUENT 150 MG/ML SOAJ INJECT 150 MG UNDER THE SKIN EVERY 14 DAYS 2 mL 2   Tiotropium Bromide Monohydrate 2.5 MCG/ACT AERS Inhale 2 puffs into the lungs at bedtime. Spiriva     traMADol (ULTRAM) 50 MG tablet Take 1 tablet (50 mg total) by mouth every 12 (twelve) hours as needed. 60 tablet 0   No current facility-administered medications on file prior to visit.     ROS see history of present illness  Objective  Physical Exam Vitals:   04/07/22 0921  BP: 110/70  Pulse: 80  Temp: 98.1 F (36.7 C)  SpO2: 95%    BP Readings from Last 3 Encounters:  04/07/22 110/70  03/25/22 128/78  03/17/22 90/60   Wt Readings from Last 3 Encounters:  04/07/22 206 lb 6.4 oz (93.6 kg)  03/25/22 203 lb 6.4 oz (92.3 kg)  03/04/22 204 lb 6.4 oz (92.7 kg)    Physical Exam Constitutional:      General: He is not in acute distress.    Appearance: He is not diaphoretic.  HENT:     Head:   Cardiovascular:     Rate and Rhythm: Normal rate and regular rhythm.     Heart sounds: Normal heart sounds.  Pulmonary:     Effort: Pulmonary effort is normal.     Breath sounds: Normal breath sounds.  Skin:    General: Skin is warm and dry.  Neurological:     Mental Status: He is alert.      Assessment/Plan: Please see individual problem list.  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      Hyperlipidemia (Chronic)    Continue Praluent 150 mg every 14 days and Zetia 10 mg daily.      Paroxysmal atrial fibrillation ([facility]) (Chronic)    Patient will continue Eliquis 5 mg twice daily.  Amiodarone is managed by cardiology.      Abrasion    Patient seems to have mildly bumped his head on a cabinet and has  superficial abrasion.  He will monitor for any headache or other symptoms that would indicate a neurological issue.      Bug bites    These have improved with  cooler weather.  He will monitor.      Clostridioides difficile diarrhea    Symptoms have not recurred.  He will monitor for any recurrence.      Other Visit Diagnoses     Need for immunization against influenza    -  Primary   Relevant Orders   Flu Vaccine QUAD High Dose(Fluad) (Completed)      Return in about 3 months (around 07/07/2022).   [name], MD [place]

## 2022-04-07 NOTE — Assessment & Plan Note (Signed)
Symptoms have not recurred.  He will monitor for any recurrence.

## 2022-04-07 NOTE — Assessment & Plan Note (Signed)
Continue Praluent 150 mg every 14 days and Zetia 10 mg daily.

## 2022-04-14 ENCOUNTER — Other Ambulatory Visit: Payer: Self-pay

## 2022-04-14 MED ORDER — AMIODARONE HCL 200 MG PO TABS
ORAL_TABLET | ORAL | 6 refills | Status: DC
Start: 2022-04-14 — End: 2023-09-24

## 2022-04-15 ENCOUNTER — Ambulatory Visit: Payer: No Typology Code available for payment source

## 2022-04-15 ENCOUNTER — Ambulatory Visit (INDEPENDENT_AMBULATORY_CARE_PROVIDER_SITE_OTHER): Payer: Medicare HMO | Admitting: Family

## 2022-04-15 ENCOUNTER — Encounter: Payer: Self-pay | Admitting: Family

## 2022-04-15 ENCOUNTER — Ambulatory Visit (INDEPENDENT_AMBULATORY_CARE_PROVIDER_SITE_OTHER): Payer: Medicare HMO

## 2022-04-15 VITALS — BP 102/60 | HR 72 | Temp 97.8°F | Ht 69.0 in | Wt 203.2 lb

## 2022-04-15 DIAGNOSIS — J029 Acute pharyngitis, unspecified: Secondary | ICD-10-CM | POA: Diagnosis not present

## 2022-04-15 DIAGNOSIS — R051 Acute cough: Secondary | ICD-10-CM

## 2022-04-15 LAB — POCT INFLUENZA A/B
Influenza A, POC: NEGATIVE
Influenza B, POC: NEGATIVE

## 2022-04-15 LAB — POCT RAPID STREP A (OFFICE): Rapid Strep A Screen: NEGATIVE

## 2022-04-15 LAB — POC COVID19 BINAXNOW: SARS Coronavirus 2 Ag: NEGATIVE

## 2022-04-15 MED ORDER — LIDOCAINE VISCOUS HCL 2 % MT SOLN: 15.0000 mL | OROMUCOSAL | 0 refills | Status: DC | PRN

## 2022-04-15 NOTE — Patient Instructions (Addendum)
Start mucinex to help break up thick mucus  I have also provided you with a viscous lidocaine swish and swallow for sore throat.  You may also use salt water gargles  We are also sending off COVID flu and RSV swab to ensure that it is in fact negative.  I have ordered a stat CT of your neck and chest x-ray.  I will await these test prior to further treatment.  Most certainly if symptoms were to worsen or you develop any acute concerns, please let me know.  I would like to avoid antibiotics unless we feel strongly this is bacterial due to your recent history of C. Difficile

## 2022-04-15 NOTE — Progress Notes (Signed)
Subjective:    Patient ID: [name], male    DOB: [birth year], 76 y.o.   MRN: [id number]  CC: [name] is a 76 y.o. male who presents today for an acute visit.    HPI: Complains of slight increase cough, congestion , [name] swollen [name], x 1 day, felt more congestion today which prompted visit.  [name] tender to the touch. Throat is beginning sore.    More congestion than normal.  SOB at baseline.   [name] hasnt tried any medicaiton.   No cp, leg swelling, orthopnea, fever trouble swallowing or difficulty managing secretions   198lbs today at home.       Shingrex vaccine 02/24/22   CDifficile 01/2022. Treated with augmentin 01/03/22  Ct neck 12/12/2021 Scattered lymph nodes in the neck and retroclavicular regions which have enlarged from 10/18/2021 and measure up to 1 cm in short axis.    [name] is compliant with albuterol inhaler, Symbicort, Spiriva.    Compliant with Eliquis 5 mg twice daily, amiodarone.  History of CLL-continues to follow with oncology , last visit 03/04/22. Currently undergoing chemotherapy.   H/o  COPD, obstructive sleep apnea- follows with Dr Mortimer Fries  H/o  coronary artery disease, cardiomyopathy, chronic diastolic heart failure, CVA ( 10/2021), peripheral artery disease, sick sinus syndrome, paroxysmal atrial fibrillation  Never smoker GFR 60  HISTORY:  Past Medical History:  Diagnosis Date   Anxiety    Arthritis    Atrial fibrillation ([place])    a. Dx 2013, recurred 02/2014, CHA2DS2VASc = 3 -->placed on Eliquis;  b. 02/2014 Echo: EF 50-55%, mid and apical anterior septum and mid and apical inf septum are abnl, mild to mod Ao sclerosis w/o AS.   Cancer associated pain    Chicken pox    Chronic lymphocytic leukemia ([place])    a. Dx 02/2014.   CLL (chronic lymphocytic leukemia) (HCC)    Complication of anesthesia    History of  PTSD--do not touch patient when waking up from surgery.   COPD (chronic obstructive pulmonary disease) (HCC)     Coronary artery disease    a. 04/2009 CABG x 3 (LIMA->LAD, VG->OM1, VG->PDA);  b. 09/2009 Cath: occluded VG x 2 w/ patent LIMA and L->R collats. EF 55%, mild antlat HK;  c. 10/2011 MV: EF 53%, no isch/infarct-->low risk.   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   TIA (transient ischemic attack) 11/02/2015   Past Surgical History:  Procedure Laterality Date   ABDOMINAL HERNIA REPAIR     APPENDECTOMY  06/21/1985   CARDIAC CATHETERIZATION  2010; 2011   ; Dr Fletcher Anon   CORONARY ARTERY BYPASS GRAFT  04/2009   "CABG X3"   EP IMPLANTABLE DEVICE N/A 03/03/2016   Procedure: Pacemaker Implant;  Surgeon: Deboraha Sprang, MD;  Location: [facility];  Service: Cardiovascular;  Laterality: N/A;   FOREIGN BODY REMOVAL  1968   "shrapnel in my tailbone"   INGUINAL HERNIA REPAIR Right  INSERT / REPLACE / REMOVE PACEMAKER     JOINT REPLACEMENT Right 2018   LAPAROSCOPIC CHOLECYSTECTOMY     RIGHT/LEFT HEART CATH AND CORONARY/GRAFT ANGIOGRAPHY N/A 02/04/2021   Procedure: RIGHT/LEFT HEART CATH AND CORONARY/GRAFT ANGIOGRAPHY;  Surgeon: Nelva Bush, MD;  Location: [facility];  Service: Cardiovascular;  Laterality: N/A;   TONSILLECTOMY AND ADENOIDECTOMY  1956   TOTAL HIP ARTHROPLASTY Right 10/22/2016   Procedure: TOTAL HIP ARTHROPLASTY;  Surgeon: Dereck Leep, MD;  Location: [facility];  Service: Orthopedics;  Laterality: Right;   TOTAL HIP ARTHROPLASTY Left 11/04/2017   Procedure: TOTAL HIP ARTHROPLASTY;  Surgeon: Dereck Leep, MD;  Location: [facility];   Service: Orthopedics;  Laterality: Left;   Family History  Problem Relation Age of Onset   Heart disease Mother    Heart attack Mother    Coronary artery disease Other        family history    Allergies: Patient has no known allergies. Current Outpatient Medications on File Prior to Visit  Medication Sig Dispense Refill   acalabrutinib maleate 100 MG TABS Take 1 tablet by mouth 2 (two) times daily.     acetaminophen (TYLENOL) 325 MG tablet Take 1-2 tablets (325-650 mg total) by mouth every 4 (four) hours as needed for mild pain.     acyclovir (ZOVIRAX) 400 MG tablet TAKE 1 TABLET(400 MG) BY MOUTH TWICE DAILY 60 tablet 6   albuterol (VENTOLIN HFA) 108 (90 Base) MCG/ACT inhaler Inhale 2 puffs into the lungs every 6 (six) hours as needed for wheezing or shortness of breath. 1 each 0   amiodarone (PACERONE) 200 MG tablet Take 1/2 tablet (100 mg) by mouth twice daily 5 days a week 60 tablet 6   apixaban (ELIQUIS) 5 MG TABS tablet Take 5 mg by mouth 2 (two) times daily.     azelastine (ASTELIN) 0.1 % nasal spray Place 2 sprays into both nostrils 2 (two) times daily. Use in each nostril as directed 30 mL 1   budesonide-formoterol (SYMBICORT) 160-4.5 MCG/ACT inhaler Inhale 2 puffs into the lungs 2 (two) times daily. 1 Inhaler 11   cetirizine (ZYRTEC) 10 MG tablet Take 10 mg by mouth daily as needed for allergies.      Coenzyme Q10 (COQ10) 200 MG CAPS Take 200 mg by mouth daily.     diphenoxylate-atropine (LOMOTIL) 2.5-0.025 MG tablet Take 1 tablet by mouth 4 (four) times daily as needed for diarrhea or loose stools. 45 tablet 0   ezetimibe (ZETIA) 10 MG tablet Take 1 tablet (10 mg total) by mouth daily. 90 tablet 3   furosemide (LASIX) 40 MG tablet Take 1 tablet (40 mg total) by mouth daily. 30 tablet 0   Krill Oil 350 MG CAPS Take 350 mg by mouth daily as needed (Heart).     mirtazapine (REMERON) 15 MG tablet Take 15 mg by mouth at bedtime as needed (for panic associated with PTSD).       Multiple Vitamin (MULTIVITAMIN WITH MINERALS) TABS tablet Take 1 tablet by mouth daily.     nitroGLYCERIN (NITROSTAT) 0.4 MG SL tablet Place 1 tablet (0.4 mg total) under the tongue every 5 (five) minutes as needed for chest pain. (Patient taking differently: Place 0.4 mg under the tongue every 5 (five) minutes as needed for chest pain. Has not needed) 25 tablet 6   PRALUENT 150 MG/ML SOAJ INJECT 150 MG UNDER THE SKIN EVERY 14 DAYS 2 mL 2   Tiotropium Bromide Monohydrate 2.5 MCG/ACT AERS Inhale  2 puffs into the lungs at bedtime. Spiriva     traMADol (ULTRAM) 50 MG tablet Take 1 tablet (50 mg total) by mouth every 12 (twelve) hours as needed. 60 tablet 0   No current facility-administered medications on file prior to visit.    Social History   Tobacco Use   Smoking status: Former    Packs/day: 1.00    Years: 40.00    Total pack years: 40.00    Types: Cigarettes    Quit date: 07/21/2006    Years since quitting: 15.7   Smokeless tobacco: Never  Vaping Use   Vaping Use: Former  Substance Use Topics   Alcohol use: Not Currently   Drug use: No    Review of Systems  Constitutional:  Negative for chills and fever.  HENT:  Positive for congestion and sore throat. Negative for postnasal drip, sinus pressure and trouble swallowing.   Respiratory:  Positive for cough and shortness of breath.   Cardiovascular:  Negative for chest pain, palpitations and leg swelling.  Gastrointestinal:  Negative for nausea and vomiting.      Objective:    BP 102/60 (BP Location: Left Arm, Patient Position: Sitting, Cuff Size: Large)   Pulse 72   Temp 97.8 F (36.6 C) (Oral)   Ht '5\' 9"'$  (1.753 m)   Wt 203 lb 3.2 oz (92.2 kg)   SpO2 96%   BMI 30.01 kg/m  Wt Readings from Last 3 Encounters:  04/15/22 203 lb 3.2 oz (92.2 kg)  04/07/22 206 lb 6.4 oz (93.6 kg)  03/25/22 203 lb 6.4 oz (92.3 kg)     Physical Exam Vitals reviewed.  Constitutional:      Appearance: [name] is well-developed.  HENT:      Head: Normocephalic and atraumatic.     Right Ear: Hearing, tympanic membrane, ear canal and external ear normal. No decreased hearing noted. No drainage, swelling or tenderness. No middle ear effusion. Tympanic membrane is not injected, erythematous or bulging.     Left Ear: Hearing, tympanic membrane, ear canal and external ear normal. No decreased hearing noted. No drainage, swelling or tenderness.  No middle ear effusion. Tympanic membrane is not injected, erythematous or bulging.     Nose: Nose normal.     Right Sinus: No maxillary sinus tenderness or frontal sinus tenderness.     Left Sinus: No maxillary sinus tenderness or frontal sinus tenderness.     Mouth/Throat:     Pharynx: Uvula midline. No oropharyngeal exudate or posterior oropharyngeal erythema.     Tonsils: No tonsillar abscesses.  Eyes:     Conjunctiva/sclera: Conjunctivae normal.  Cardiovascular:     Rate and Rhythm: Regular rhythm.     Heart sounds: Normal heart sounds.  Pulmonary:     Effort: Pulmonary effort is normal. No respiratory distress.     Breath sounds: Normal breath sounds. No wheezing, rhonchi or rales.  Lymphadenopathy:     Head:     Right side of head: Submandibular adenopathy present. No submental, tonsillar, preauricular, posterior auricular or occipital adenopathy.     Left side of head: Submandibular adenopathy present. No submental, tonsillar, preauricular, posterior auricular or occipital adenopathy.     Cervical: No cervical adenopathy.     Comments: Slightly tender and question if symmetrically enlarged submandibular lymph nodes.  Area is nonfluctuant.  No increased heat erythema.  Skin is intact  Skin:    General: Skin is warm and dry.  Neurological:     Mental Status: [name] is  alert.  Psychiatric:        Speech: Speech normal.        Behavior: Behavior normal.        Assessment & Plan:   Problem List Items Addressed This Visit       Respiratory   Pharyngitis - Primary    Duration 1  day . strep COVID and flu point-of-care negative.  Of note: Shingrex vaccine 02/24/22 . Subtle increase in size in area of submandibular lymph nodes.  Previously noted the same to be enlarged on CT neck 12/12/21.  Recently hospitalized for C. difficile in July of this year suspected after a course of Augmentin 12/2021.  Advised patient that I suspect this is more of a viral presentation or related to CLL.  I have ordered COVID and flu Crestor PCR to ensure we have not tested too soon.  Pending stat CT neck and plan to consult with Dr Burlene Arnt.  I also ordered chest x-ray due to increased congestion.  For now advised for him to start Mucinex, use acetaminophen as needed, lidocaine swish and swallow for sore throat.  We will consider prednisone after we have the results of tests.       Relevant Medications   lidocaine (XYLOCAINE) 2 % solution   Other Relevant Orders   DG Chest 2 View   CT SOFT TISSUE NECK W CONTRAST   Other Visit Diagnoses     Acute cough       Relevant Orders   POC COVID-19   POCT Influenza A/B   POCT rapid strep A (Completed)   Sore throat       Relevant Orders   POC COVID-19   POCT Influenza A/B   POCT rapid strep A (Completed)         I have discontinued Yevonne Aline. Galant's potassium chloride. I am also having him start on lidocaine. Additionally, I am having him maintain his multivitamin with minerals, cetirizine, Krill Oil, mirtazapine, budesonide-formoterol, ezetimibe, nitroGLYCERIN, CoQ10, apixaban, Tiotropium Bromide Monohydrate, acyclovir, acetaminophen, albuterol, azelastine, furosemide, traMADol, diphenoxylate-atropine, acalabrutinib maleate, Praluent, and amiodarone.   Meds ordered this encounter  Medications   lidocaine (XYLOCAINE) 2 % solution    Sig: Use as directed 15 mLs in the mouth or throat every 3 (three) hours as needed for mouth pain (gargle; may spit or swallow).    Dispense:  100 mL    Refill:  0    Order Specific Question:   Supervising  Provider    Answer:   Crecencio Mc [[id number]]    Return precautions given.   Risks, benefits, and alternatives of the medications and treatment plan prescribed today were discussed, and patient expressed understanding.   Education regarding symptom management and diagnosis given to patient on AVS.  Continue to follow with Leone Haven, MD for routine health maintenance.   [name] and I agreed with plan.   Mable Paris, FNP

## 2022-04-15 NOTE — Assessment & Plan Note (Addendum)
Duration 1 day . strep COVID and flu point-of-care negative.  Of note: Shingrex vaccine 02/24/22 . Subtle increase in size in area of submandibular lymph nodes.  Previously noted the same to be enlarged on CT neck 12/12/21.  Recently hospitalized for C. difficile in July of this year suspected after a course of Augmentin 12/2021.  Advised patient that I suspect this is more of a viral presentation or related to CLL.  I have ordered COVID and flu Crestor PCR to ensure we have not tested too soon.  Pending stat CT neck and plan to consult with Dr Burlene Arnt.  I also ordered chest x-ray due to increased congestion.  For now advised for him to start Mucinex, use acetaminophen as needed, lidocaine swish and swallow for sore throat.  We will consider prednisone after we have the results of tests.

## 2022-04-16 ENCOUNTER — Ambulatory Visit
Admission: RE | Admit: 2022-04-16 | Discharge: 2022-04-16 | Disposition: A | Discharge status: Home / Self Care | Payer: Medicare HMO | Source: Ambulatory Visit | Attending: Family | Admitting: Family

## 2022-04-16 ENCOUNTER — Inpatient Hospital Stay: Payer: Medicare HMO | Attending: Internal Medicine

## 2022-04-16 ENCOUNTER — Telehealth: Payer: Self-pay | Admitting: Family

## 2022-04-16 DIAGNOSIS — J029 Acute pharyngitis, unspecified: Secondary | ICD-10-CM | POA: Insufficient documentation

## 2022-04-16 DIAGNOSIS — I6523 Occlusion and stenosis of bilateral carotid arteries: Secondary | ICD-10-CM

## 2022-04-16 MED ORDER — IOHEXOL 300 MG/ML  SOLN
75.0000 mL | Freq: Once | INTRAMUSCULAR | Status: AC | PRN
  Administered 2022-04-16: 75 mL via INTRAVENOUS

## 2022-04-16 NOTE — Progress Notes (Signed)
Nutrition  Patient did not show up for scheduled nutrition appointment today.   Will send a message to scheduling to offer another appointment and notify provider  [name], [place], [place] Registered Dietitian [phone]

## 2022-04-16 NOTE — Telephone Encounter (Signed)
[name],  How soon can patient be seen by ENT?  I marked as urgent.     Spoke with pt  No throat pain, congestion, fever, pain with swallowing , chewing, pain with anticipation of chewing .  Neck is not more swollen. Both sides of neck are tender to touch however unchanged.    H/o CLL, CAD Discussed CT neck which shows advanced cervical disc generation, carotid atherosclerosis, thickening of the paranasal sinuses, subtle stranding adjacent to left submandibular gland.   Concern for sialadenitis based on images however patient does not have typical symptoms of this.  Discussed again with patient my concern with starting any antibiotic unnecessarily as he was hospitalized in July for C. difficile after Augmentin.  We jointly agreed to pursue ultrasound carotid further and I have placed an urgent referral to ENT for further consult.  I will also share this image with oncology.

## 2022-04-17 ENCOUNTER — Ambulatory Visit (INDEPENDENT_AMBULATORY_CARE_PROVIDER_SITE_OTHER): Payer: No Typology Code available for payment source | Admitting: Internal Medicine

## 2022-04-17 ENCOUNTER — Encounter: Payer: Self-pay | Admitting: Internal Medicine

## 2022-04-17 VITALS — BP 108/60 | HR 81 | Temp 98.2°F | Ht 69.0 in | Wt 202.2 lb

## 2022-04-17 DIAGNOSIS — K1120 Sialoadenitis, unspecified: Secondary | ICD-10-CM | POA: Insufficient documentation

## 2022-04-17 LAB — COVID-19, FLU A+B AND RSV
Influenza A, NAA: NOT DETECTED
Influenza B, NAA: NOT DETECTED
RSV, NAA: NOT DETECTED
SARS-CoV-2, NAA: NOT DETECTED

## 2022-04-17 NOTE — Telephone Encounter (Signed)
Referral was sent today, I called [facility] to see if I can get pt scheduled. I was advised that they have to review referral before scheduling pt once they review they will call pt to schedule.

## 2022-04-17 NOTE — Progress Notes (Signed)
Chief Complaint  Patient presents with   Edema    Swollen lymph nodes pt has pain on the right side of his head    F/u  1. Worsening swollen and pain glands neck, right cheek, lower jawline (right worse than left) and below chin swelling ct neck 04/16/22 improved cervical and thoracic lymphadenopathy with h/o CLL  Urgent Ent referral pending nothing tried pt hesistant to try Abx due to c diff after Augmentin 01/2022   Spoke with Dr. Rogue Bussing and he rec ENT as well   CT IMPRESSION: 1. Overall mildly improved cervical and upper thoracic lymphadenopathy. 2. Mild inflammatory stranding adjacent to the right greater than left submandibular glands. Correlate for signs of acute sialadenitis. No salivary stone or mass.     Electronically Signed   By: Logan Bores M.D.   On: 04/16/2022 14:38   Review of Systems  Constitutional:  Negative for weight loss.  HENT:  Negative for hearing loss.        Swelling of glands in neck R>L h/o CLL  Eyes:  Negative for blurred vision.  Respiratory:  Negative for shortness of breath.   Cardiovascular:  Negative for chest pain.  Gastrointestinal:  Negative for abdominal pain and blood in stool.  Genitourinary:  Negative for dysuria.  Musculoskeletal:  Negative for back pain, falls and joint pain.  Skin:  Negative for rash.  Neurological:  Negative for headaches.  Psychiatric/Behavioral:  Negative for depression.    Past Medical History:  Diagnosis Date   Anxiety    Arthritis    Atrial fibrillation ([place])    a. Dx 2013, recurred 02/2014, CHA2DS2VASc = 3 -->placed on Eliquis;  b. 02/2014 Echo: EF 50-55%, mid and apical anterior septum and mid and apical inf septum are abnl, mild to mod Ao sclerosis w/o AS.   Cancer associated pain    Chicken pox    Chronic lymphocytic leukemia ([place])    a. Dx 02/2014.   CLL (chronic lymphocytic leukemia) (HCC)    Complication of anesthesia    History of  PTSD--do not touch patient when waking up from surgery.    COPD (chronic obstructive pulmonary disease) (HCC)    Coronary artery disease    a. 04/2009 CABG x 3 (LIMA->LAD, VG->OM1, VG->PDA);  b. 09/2009 Cath: occluded VG x 2 w/ patent LIMA and L->R collats. EF 55%, mild antlat HK;  c. 10/2011 MV: EF 53%, no isch/infarct-->low risk.   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   TIA (transient ischemic attack) 11/02/2015   Past Surgical History:  Procedure Laterality Date   ABDOMINAL HERNIA REPAIR     APPENDECTOMY  06/21/1985   CARDIAC CATHETERIZATION  2010; 2011   ; Dr Fletcher Anon   CORONARY ARTERY BYPASS GRAFT  04/2009   "CABG X3"   EP IMPLANTABLE DEVICE N/A 03/03/2016   Procedure: Pacemaker Implant;  Surgeon: Deboraha Sprang, MD;  Location: [facility];  Service: Cardiovascular;  Laterality: N/A;   FOREIGN  BODY REMOVAL  1968   "shrapnel in my tailbone"   INGUINAL HERNIA REPAIR Right    INSERT / REPLACE / REMOVE PACEMAKER     JOINT REPLACEMENT Right 2018   LAPAROSCOPIC CHOLECYSTECTOMY     RIGHT/LEFT HEART CATH AND CORONARY/GRAFT ANGIOGRAPHY N/A 02/04/2021   Procedure: RIGHT/LEFT HEART CATH AND CORONARY/GRAFT ANGIOGRAPHY;  Surgeon: Nelva Bush, MD;  Location: [facility];  Service: Cardiovascular;  Laterality: N/A;   TONSILLECTOMY AND ADENOIDECTOMY  1956   TOTAL HIP ARTHROPLASTY Right 10/22/2016   Procedure: TOTAL HIP ARTHROPLASTY;  Surgeon: Dereck Leep, MD;  Location: [facility];  Service: Orthopedics;  Laterality: Right;   TOTAL HIP ARTHROPLASTY Left 11/04/2017   Procedure: TOTAL HIP  ARTHROPLASTY;  Surgeon: Dereck Leep, MD;  Location: [facility];  Service: Orthopedics;  Laterality: Left;   Family History  Problem Relation Age of Onset   Heart disease Mother    Heart attack Mother    Coronary artery disease Other        family history   Social History   Socioeconomic History   Marital status: Married    Spouse name: Not on file   Number of children: Not on file   Years of education: Not on file   Highest education level: Not on file  Occupational History   Occupation: retired    Fish farm manager: [employer]    Comment: Education officer, [employer]  Tobacco Use   Smoking status: Former    Packs/day: 1.00    Years: 40.00    Total pack years: 40.00    Types: Cigarettes    Quit date: 07/21/2006    Years since quitting: 15.7   Smokeless tobacco: Never  Vaping Use   Vaping Use: Former  Substance and Sexual Activity   Alcohol use: Not Currently   Drug use: No   Sexual activity: Not Currently  Other Topics Concern   Not on file  Social History Narrative   Lives in [place] with wife. Dogs. Goats. Children - 4. Grandchildren - 7.      Worked - Sports coach, part time.   Diet - healthy   Exercise - very active      Service - [employer] - [place]   Social Determinants of Health   Financial Resource Strain: Low Risk  (01/30/2022)   Overall Financial Resource Strain (CARDIA)    Difficulty of Paying Living Expenses: Not hard at all  Food Insecurity: No Food Insecurity (01/30/2022)   Hunger Vital Sign    Worried About Running Out of Food in the Last Year: Never true    Ran Out of Food in the Last Year: Never true  Transportation Needs: No Transportation Needs (01/30/2022)   PRAPARE - Hydrologist (Medical): No    Lack of Transportation (Non-Medical): No  Physical Activity: Sufficiently Active (08/16/2021)   Exercise Vital Sign    Days of Exercise per Week: 7 days    Minutes of Exercise per Session: 30 min  Stress: No Stress  Concern Present (08/16/2021)   [place]    Feeling of Stress : Not at all  Social Connections: Unknown (08/16/2021)   Social Connection and Isolation Panel [NHANES]    Frequency of Communication with Friends and Family: More than three times a week    Frequency of Social Gatherings with Friends and Family: Not on file    Attends Religious Services: Not on file    Active Member  of Clubs or Organizations: Not on file    Attends Club or Organization Meetings: Not on file    Marital Status: Married  Intimate Partner Violence: Not At Risk (08/16/2021)   Humiliation, Afraid, Rape, and Kick questionnaire    Fear of Current or Ex-Partner: No    Emotionally Abused: No    Physically Abused: No    Sexually Abused: No   Current Meds  Medication Sig   acalabrutinib maleate 100 MG TABS Take 1 tablet by mouth 2 (two) times daily.   acetaminophen (TYLENOL) 325 MG tablet Take 1-2 tablets (325-650 mg total) by mouth every 4 (four) hours as needed for mild pain.   acyclovir (ZOVIRAX) 400 MG tablet TAKE 1 TABLET(400 MG) BY MOUTH TWICE DAILY   albuterol (VENTOLIN HFA) 108 (90 Base) MCG/ACT inhaler Inhale 2 puffs into the lungs every 6 (six) hours as needed for wheezing or shortness of breath.   amiodarone (PACERONE) 200 MG tablet Take 1/2 tablet (100 mg) by mouth twice daily 5 days a week   apixaban (ELIQUIS) 5 MG TABS tablet Take 5 mg by mouth 2 (two) times daily.   azelastine (ASTELIN) 0.1 % nasal spray Place 2 sprays into both nostrils 2 (two) times daily. Use in each nostril as directed   budesonide-formoterol (SYMBICORT) 160-4.5 MCG/ACT inhaler Inhale 2 puffs into the lungs 2 (two) times daily.   cetirizine (ZYRTEC) 10 MG tablet Take 10 mg by mouth daily as needed for allergies.    Coenzyme Q10 (COQ10) 200 MG CAPS Take 200 mg by mouth daily.   diphenoxylate-atropine (LOMOTIL) 2.5-0.025 MG tablet Take 1 tablet by mouth 4 (four) times daily  as needed for diarrhea or loose stools.   ezetimibe (ZETIA) 10 MG tablet Take 1 tablet (10 mg total) by mouth daily.   furosemide (LASIX) 40 MG tablet Take 1 tablet (40 mg total) by mouth daily.   Krill Oil 350 MG CAPS Take 350 mg by mouth daily as needed (Heart).   lidocaine (XYLOCAINE) 2 % solution Use as directed 15 mLs in the mouth or throat every 3 (three) hours as needed for mouth pain (gargle; may spit or swallow).   mirtazapine (REMERON) 15 MG tablet Take 15 mg by mouth at bedtime as needed (for panic associated with PTSD).    Multiple Vitamin (MULTIVITAMIN WITH MINERALS) TABS tablet Take 1 tablet by mouth daily.   nitroGLYCERIN (NITROSTAT) 0.4 MG SL tablet Place 1 tablet (0.4 mg total) under the tongue every 5 (five) minutes as needed for chest pain. (Patient taking differently: Place 0.4 mg under the tongue every 5 (five) minutes as needed for chest pain. Has not needed)   PRALUENT 150 MG/ML SOAJ INJECT 150 MG UNDER THE SKIN EVERY 14 DAYS   Tiotropium Bromide Monohydrate 2.5 MCG/ACT AERS Inhale 2 puffs into the lungs at bedtime. Spiriva   traMADol (ULTRAM) 50 MG tablet Take 1 tablet (50 mg total) by mouth every 12 (twelve) hours as needed.   Allergies  Allergen Reactions   Augmentin [Amoxicillin-Pot Clavulanate]     C diff   Recent Results (from the past 2160 hour(s))  Haptoglobin     Status: None   Collection Time: 01/20/22 10:27 AM  Result Value Ref Range   Haptoglobin 51 34 - 355 mg/dL    Comment: (NOTE) Performed At: [facility] [place], [place] [id number] Rush Farmer MD [id number]   Lactate dehydrogenase     Status: None   Collection Time: 01/20/22 10:27 AM  Result  Value Ref Range   LDH 186 98 - 192 U/L    Comment: Performed at [facility], [address] [place], [place] [address]  Comprehensive metabolic panel     Status: Abnormal   Collection Time: 01/20/22 10:27 AM  Result Value Ref Range   Sodium 139 135 - 145 mmol/L    Potassium 3.4 (L) 3.5 - 5.1 mmol/L   Chloride 107 98 - 111 mmol/L   CO2 25 22 - 32 mmol/L   Glucose, Bld 120 (H) 70 - 99 mg/dL    Comment: Glucose reference range applies only to samples taken after fasting for at least 8 hours.   BUN 14 8 - 23 mg/dL   Creatinine, Ser 0.89 0.61 - 1.24 mg/dL   Calcium 8.5 (L) 8.9 - 10.3 mg/dL   Total Protein 6.9 6.5 - 8.1 g/dL   Albumin 3.6 3.5 - 5.0 g/dL   AST 33 15 - 41 U/L   ALT 28 0 - 44 U/L   Alkaline Phosphatase 95 38 - 126 U/L   Total Bilirubin 0.7 0.3 - 1.2 mg/dL   GFR, Estimated >60 >60 mL/min    Comment: (NOTE) Calculated using the CKD-EPI Creatinine Equation (2021)    Anion gap 7 5 - 15    Comment: Performed at [facility], [address] [place], [place] [address]  CBC with Differential/Platelet     Status: Abnormal   Collection Time: 01/20/22 10:27 AM  Result Value Ref Range   WBC 7.9 4.0 - 10.5 K/uL   RBC 3.49 (L) 4.22 - 5.81 MIL/uL   Hemoglobin 10.8 (L) 13.0 - 17.0 g/dL   HCT 34.6 (L) 39.0 - 52.0 %   MCV 99.1 80.0 - 100.0 fL   MCH 30.9 26.0 - 34.0 pg   MCHC 31.2 30.0 - 36.0 g/dL   RDW 17.0 (H) 11.5 - 15.5 %   Platelets 156 150 - 400 K/uL   nRBC 0.0 0.0 - 0.2 %   Neutrophils Relative % 52 %   Neutro Abs 4.0 1.7 - 7.7 K/uL   Lymphocytes Relative 34 %   Lymphs Abs 2.7 0.7 - 4.0 K/uL   Monocytes Relative 10 %   Monocytes Absolute 0.8 0.1 - 1.0 K/uL   Eosinophils Relative 3 %   Eosinophils Absolute 0.3 0.0 - 0.5 K/uL   Basophils Relative 1 %   Basophils Absolute 0.1 0.0 - 0.1 K/uL   Immature Granulocytes 0 %   Abs Immature Granulocytes 0.03 0.00 - 0.07 K/uL    Comment: Performed at [facility], [address] [place], [place] [address]  Magnesium     Status: None   Collection Time: 01/27/22  1:07 PM  Result Value Ref Range   Magnesium 2.0 1.7 - 2.4 mg/dL    Comment: Performed at [facility], [address] [place], [place] [address]  Comprehensive metabolic panel     Status: Abnormal   Collection  Time: 01/27/22  1:07 PM  Result Value Ref Range   Sodium 135 135 - 145 mmol/L   Potassium 3.1 (L) 3.5 - 5.1 mmol/L   Chloride 105 98 - 111 mmol/L   CO2 21 (L) 22 - 32 mmol/L   Glucose, Bld 138 (H) 70 - 99 mg/dL    Comment: Glucose reference range applies only to samples taken after fasting for at least 8 hours.   BUN 23 8 - 23 mg/dL   Creatinine, Ser 1.30 (H) 0.61 - 1.24 mg/dL   Calcium  8.7 (L) 8.9 - 10.3 mg/dL   Total Protein 7.7 6.5 - 8.1 g/dL   Albumin 3.8 3.5 - 5.0 g/dL   AST 41 15 - 41 U/L   ALT 34 0 - 44 U/L   Alkaline Phosphatase 128 (H) 38 - 126 U/L   Total Bilirubin 1.1 0.3 - 1.2 mg/dL   GFR, Estimated 57 (L) >60 mL/min    Comment: (NOTE) Calculated using the CKD-EPI Creatinine Equation (2021)    Anion gap 9 5 - 15    Comment: Performed at [facility], [address] [place], [place] [address]  CBC with Differential     Status: Abnormal   Collection Time: 01/27/22  1:07 PM  Result Value Ref Range   WBC 12.6 (H) 4.0 - 10.5 K/uL   RBC 3.95 (L) 4.22 - 5.81 MIL/uL   Hemoglobin 12.6 (L) 13.0 - 17.0 g/dL   HCT 38.8 (L) 39.0 - 52.0 %   MCV 98.2 80.0 - 100.0 fL   MCH 31.9 26.0 - 34.0 pg   MCHC 32.5 30.0 - 36.0 g/dL   RDW 16.5 (H) 11.5 - 15.5 %   Platelets 210 150 - 400 K/uL   nRBC 0.0 0.0 - 0.2 %   Neutrophils Relative % 51 %   Neutro Abs 6.6 1.7 - 7.7 K/uL   Lymphocytes Relative 38 %   Lymphs Abs 4.7 (H) 0.7 - 4.0 K/uL   Monocytes Relative 9 %   Monocytes Absolute 1.1 (H) 0.1 - 1.0 K/uL   Eosinophils Relative 1 %   Eosinophils Absolute 0.1 0.0 - 0.5 K/uL   Basophils Relative 1 %   Basophils Absolute 0.1 0.0 - 0.1 K/uL   Immature Granulocytes 0 %   Abs Immature Granulocytes 0.05 0.00 - 0.07 K/uL    Comment: Performed at [facility], [address] [place], [place] [address]  C difficile quick screen w PCR reflex     Status: Abnormal   Collection Time: 01/27/22  2:28 PM   Specimen: STOOL  Result Value Ref Range   C Diff antigen POSITIVE (A)  NEGATIVE   C Diff toxin POSITIVE (A) NEGATIVE   C Diff interpretation Toxin producing C. difficile detected.     Comment: CRITICAL RESULT CALLED TO, READ BACK BY AND VERIFIED WITH: DR. [facility] AT 1621 01/27/22.PMF Performed at [facility], [address] [place], [place] [address]   Gastrointestinal Panel by PCR , Stool     Status: None   Collection Time: 01/27/22  2:28 PM  Result Value Ref Range   Campylobacter species NOT DETECTED NOT DETECTED   Plesimonas shigelloides NOT DETECTED NOT DETECTED   Salmonella species NOT DETECTED NOT DETECTED   Yersinia enterocolitica NOT DETECTED NOT DETECTED   Vibrio species NOT DETECTED NOT DETECTED   Vibrio cholerae NOT DETECTED NOT DETECTED   Enteroaggregative E coli (EAEC) NOT DETECTED NOT DETECTED   Enteropathogenic E coli (EPEC) NOT DETECTED NOT DETECTED   Enterotoxigenic E coli (ETEC) NOT DETECTED NOT DETECTED   Shiga like toxin producing E coli (STEC) NOT DETECTED NOT DETECTED   Shigella/Enteroinvasive E coli (EIEC) NOT DETECTED NOT DETECTED   Cryptosporidium NOT DETECTED NOT DETECTED   Cyclospora cayetanensis NOT DETECTED NOT DETECTED   Entamoeba histolytica NOT DETECTED NOT DETECTED   Giardia lamblia NOT DETECTED NOT DETECTED   Adenovirus F40/41 NOT DETECTED NOT DETECTED   Astrovirus NOT DETECTED NOT DETECTED   Norovirus GI/GII NOT DETECTED NOT DETECTED   Rotavirus A NOT DETECTED NOT  DETECTED   Sapovirus (I, II, IV, and V) NOT DETECTED NOT DETECTED    Comment: Performed at [facility], [address] [place], [place] [address]  CBC with Differential     Status: Abnormal   Collection Time: 01/27/22  6:46 PM  Result Value Ref Range   WBC 8.2 4.0 - 10.5 K/uL   RBC 3.55 (L) 4.22 - 5.81 MIL/uL   Hemoglobin 11.0 (L) 13.0 - 17.0 g/dL   HCT 34.8 (L) 39.0 - 52.0 %   MCV 98.0 80.0 - 100.0 fL   MCH 31.0 26.0 - 34.0 pg   MCHC 31.6 30.0 - 36.0 g/dL   RDW 16.4 (H) 11.5 - 15.5 %   Platelets 186 150 - 400 K/uL   nRBC 0.0  0.0 - 0.2 %   Neutrophils Relative % 77 %   Neutro Abs 6.4 1.7 - 7.7 K/uL   Lymphocytes Relative 20 %   Lymphs Abs 1.7 0.7 - 4.0 K/uL   Monocytes Relative 2 %   Monocytes Absolute 0.1 0.1 - 1.0 K/uL   Eosinophils Relative 0 %   Eosinophils Absolute 0.0 0.0 - 0.5 K/uL   Basophils Relative 0 %   Basophils Absolute 0.0 0.0 - 0.1 K/uL   Immature Granulocytes 1 %   Abs Immature Granulocytes 0.05 0.00 - 0.07 K/uL    Comment: Performed at [facility], [address] [place], [place] [address]  Basic metabolic panel     Status: Abnormal   Collection Time: 01/27/22  6:46 PM  Result Value Ref Range   Sodium 137 135 - 145 mmol/L   Potassium 3.6 3.5 - 5.1 mmol/L   Chloride 108 98 - 111 mmol/L   CO2 21 (L) 22 - 32 mmol/L   Glucose, Bld 160 (H) 70 - 99 mg/dL    Comment: Glucose reference range applies only to samples taken after fasting for at least 8 hours.   BUN 21 8 - 23 mg/dL   Creatinine, Ser 0.95 0.61 - 1.24 mg/dL   Calcium 8.4 (L) 8.9 - 10.3 mg/dL   GFR, Estimated >60 >60 mL/min    Comment: (NOTE) Calculated using the CKD-EPI Creatinine Equation (2021)    Anion gap 8 5 - 15    Comment: Performed at [facility], [address] [place], [place] [address]  Basic metabolic panel     Status: Abnormal   Collection Time: 01/28/22  5:11 AM  Result Value Ref Range   Sodium 138 135 - 145 mmol/L   Potassium 3.9 3.5 - 5.1 mmol/L   Chloride 112 (H) 98 - 111 mmol/L   CO2 22 22 - 32 mmol/L   Glucose, Bld 180 (H) 70 - 99 mg/dL    Comment: Glucose reference range applies only to samples taken after fasting for at least 8 hours.   BUN 19 8 - 23 mg/dL   Creatinine, Ser 1.01 0.61 - 1.24 mg/dL   Calcium 8.3 (L) 8.9 - 10.3 mg/dL   GFR, Estimated >60 >60 mL/min    Comment: (NOTE) Calculated using the CKD-EPI Creatinine Equation (2021)    Anion gap 4 (L) 5 - 15    Comment: Performed at [facility], [address] [place], [place] [address]  CUP PACEART REMOTE  DEVICE CHECK     Status: None   Collection Time: 02/04/22  9:39 AM  Result Value Ref Range   Date Time Interrogation Session [id number]    Pulse Generator Manufacturer MERM    Pulse  Gen Model [id number] Advisa DR MRI    Pulse Gen Serial Number [id number]    Clinic Name [facility] Pulse Generator Type Implantable Pulse Generator    Implantable Pulse Generator Implant Date 31517616    Implantable Lead Manufacturer MERM    Implantable Lead Model 5076 CapSureFix Novus MRI SureScan    Implantable Lead Serial Number [id number]    Implantable Lead Implant Date 48546270    Implantable Lead Location Detail 1 UNKNOWN    Implantable Lead Location [phone]    Implantable Lead Manufacturer MERM    Implantable Lead Model 5076 CapSureFix Novus MRI SureScan    Implantable Lead Serial Number [id number]    Implantable Lead Implant Date 96789381    Implantable Lead Location Detail 1 UNKNOWN    Implantable Lead Location [phone]    Lead Channel Setting Sensing Sensitivity 0.9 mV   Lead Channel Setting Pacing Amplitude 2 V   Lead Channel Setting Pacing Pulse Width 0.4 ms   Lead Channel Setting Pacing Amplitude 2.5 V   Lead Channel Impedance Value 418 ohm   Lead Channel Impedance Value 304 ohm   Lead Channel Sensing Intrinsic Amplitude 3 mV   Lead Channel Sensing Intrinsic Amplitude 3 mV   Lead Channel Pacing Threshold Amplitude 1 V   Lead Channel Pacing Threshold Pulse Width 0.4 ms   Lead Channel Impedance Value 532 ohm   Lead Channel Impedance Value 456 ohm   Lead Channel Sensing Intrinsic Amplitude 16.875 mV   Lead Channel Sensing Intrinsic Amplitude 16.875 mV   Lead Channel Pacing Threshold Amplitude 0.75 V   Lead Channel Pacing Threshold Pulse Width 0.4 ms   Battery Status OK    Battery Remaining Longevity 50 mo   Battery Voltage 2.98 V   Brady Statistic RA Percent Paced 33.26 %   Brady Statistic RV Percent Paced 0.13 %   Brady Statistic AP VP Percent 0.04 %   Brady  Statistic AS VP Percent 0.09 %   Brady Statistic AP VS Percent 33.22 %   Brady Statistic AS VS Percent 25.85 %  Basic Metabolic Panel (BMET)     Status: Abnormal   Collection Time: 02/04/22 11:17 AM  Result Value Ref Range   Sodium 143 135 - 145 mEq/L   Potassium 3.6 3.5 - 5.1 mEq/L   Chloride 110 96 - 112 mEq/L   CO2 27 19 - 32 mEq/L   Glucose, Bld 101 (H) 70 - 99 mg/dL   BUN 15 6 - 23 mg/dL   Creatinine, Ser 0.77 0.40 - 1.50 mg/dL   GFR 86.89 >60.00 mL/min    Comment: Calculated using the CKD-EPI Creatinine Equation (2021)   Calcium 8.4 8.4 - 10.5 mg/dL  CBC     Status: Abnormal   Collection Time: 02/04/22 11:17 AM  Result Value Ref Range   WBC 9.4 4.0 - 10.5 K/uL   RBC 3.48 (L) 4.22 - 5.81 Mil/uL   Platelets 161.0 150.0 - 400.0 K/uL   Hemoglobin 11.1 (L) 13.0 - 17.0 g/dL   HCT 33.6 (L) 39.0 - 52.0 %   MCV 96.6 78.0 - 100.0 fl   MCHC 33.1 30.0 - 36.0 g/dL   RDW 18.1 (H) 11.5 - 15.5 %  Lactate dehydrogenase     Status: None   Collection Time: 03/04/22  2:36 PM  Result Value Ref Range   LDH 178 98 - 192 U/L    Comment: Performed at [facility], [address], [place], [place] [address]  Comprehensive metabolic panel     Status: Abnormal   Collection Time: 03/04/22  2:36 PM  Result Value Ref Range   Sodium 141 135 - 145 mmol/L   Potassium 3.8 3.5 - 5.1 mmol/L   Chloride 106 98 - 111 mmol/L   CO2 26 22 - 32 mmol/L   Glucose, Bld 107 (H) 70 - 99 mg/dL    Comment: Glucose reference range applies only to samples taken after fasting for at least 8 hours.   BUN 16 8 - 23 mg/dL   Creatinine, Ser 0.98 0.61 - 1.24 mg/dL   Calcium 8.7 (L) 8.9 - 10.3 mg/dL   Total Protein 7.5 6.5 - 8.1 g/dL   Albumin 3.8 3.5 - 5.0 g/dL   AST 26 15 - 41 U/L   ALT 21 0 - 44 U/L   Alkaline Phosphatase 99 38 - 126 U/L   Total Bilirubin 0.7 0.3 - 1.2 mg/dL   GFR, Estimated >60 >60 mL/min    Comment: (NOTE) Calculated using the CKD-EPI Creatinine Equation (2021)    Anion gap 9 5 - 15     Comment: Performed at [facility], [address] [place], [place] [address]  CBC with Differential/Platelet     Status: Abnormal   Collection Time: 03/04/22  2:36 PM  Result Value Ref Range   WBC 9.2 4.0 - 10.5 K/uL   RBC 3.62 (L) 4.22 - 5.81 MIL/uL   Hemoglobin 11.7 (L) 13.0 - 17.0 g/dL   HCT 36.5 (L) 39.0 - 52.0 %   MCV 100.8 (H) 80.0 - 100.0 fL   MCH 32.3 26.0 - 34.0 pg   MCHC 32.1 30.0 - 36.0 g/dL   RDW 15.4 11.5 - 15.5 %   Platelets 140 (L) 150 - 400 K/uL   nRBC 0.0 0.0 - 0.2 %   Neutrophils Relative % 41 %   Neutro Abs 3.8 1.7 - 7.7 K/uL   Lymphocytes Relative 42 %   Lymphs Abs 3.8 0.7 - 4.0 K/uL   Monocytes Relative 9 %   Monocytes Absolute 0.9 0.1 - 1.0 K/uL   Eosinophils Relative 7 %   Eosinophils Absolute 0.7 (H) 0.0 - 0.5 K/uL   Basophils Relative 1 %   Basophils Absolute 0.1 0.0 - 0.1 K/uL   Immature Granulocytes 0 %   Abs Immature Granulocytes 0.04 0.00 - 0.07 K/uL    Comment: Performed at [facility], [address] [place], [place] [address]  CBC with Differential     Status: Abnormal   Collection Time: 03/10/22  2:16 PM  Result Value Ref Range   WBC 9.3 4.0 - 10.5 K/uL   RBC 3.12 (L) 4.22 - 5.81 MIL/uL   Hemoglobin 10.1 (L) 13.0 - 17.0 g/dL   HCT 31.8 (L) 39.0 - 52.0 %   MCV 101.9 (H) 80.0 - 100.0 fL   MCH 32.4 26.0 - 34.0 pg   MCHC 31.8 30.0 - 36.0 g/dL   RDW 16.4 (H) 11.5 - 15.5 %   Platelets 155 150 - 400 K/uL   nRBC 0.0 0.0 - 0.2 %   Neutrophils Relative % 42 %   Neutro Abs 3.9 1.7 - 7.7 K/uL   Lymphocytes Relative 46 %   Lymphs Abs 4.3 (H) 0.7 - 4.0 K/uL   Monocytes Relative 10 %   Monocytes Absolute 1.0 0.1 - 1.0 K/uL   Eosinophils Relative 2 %   Eosinophils Absolute 0.1 0.0 - 0.5 K/uL   Basophils Relative 0 %  Basophils Absolute 0.0 0.0 - 0.1 K/uL   Immature Granulocytes 0 %   Abs Immature Granulocytes 0.03 0.00 - 0.07 K/uL    Comment: Performed at [facility] [address] [place], [place] [address]  Magnesium      Status: None   Collection Time: 03/10/22  2:16 PM  Result Value Ref Range   Magnesium 2.2 1.7 - 2.4 mg/dL    Comment: Performed at [facility], [address] [place], [place] [address]  Lactate dehydrogenase     Status: None   Collection Time: 03/10/22  2:16 PM  Result Value Ref Range   LDH 114 98 - 192 U/L    Comment: Performed at [facility], [address] [place], [place] [address]  Comprehensive metabolic panel     Status: Abnormal   Collection Time: 03/10/22  2:16 PM  Result Value Ref Range   Sodium 140 135 - 145 mmol/L   Potassium 3.2 (L) 3.5 - 5.1 mmol/L   Chloride 108 98 - 111 mmol/L   CO2 24 22 - 32 mmol/L   Glucose, Bld 119 (H) 70 - 99 mg/dL    Comment: Glucose reference range applies only to samples taken after fasting for at least 8 hours.   BUN 22 8 - 23 mg/dL   Creatinine, Ser 1.18 0.61 - 1.24 mg/dL   Calcium 8.2 (L) 8.9 - 10.3 mg/dL   Total Protein 7.1 6.5 - 8.1 g/dL   Albumin 3.4 (L) 3.5 - 5.0 g/dL   AST 18 15 - 41 U/L   ALT 17 0 - 44 U/L   Alkaline Phosphatase 86 38 - 126 U/L   Total Bilirubin 0.9 0.3 - 1.2 mg/dL   GFR, Estimated >60 >60 mL/min    Comment: (NOTE) Calculated using the CKD-EPI Creatinine Equation (2021)    Anion gap 8 5 - 15    Comment: Performed at [facility], [address] [place], [place] [address]  Magnesium     Status: None   Collection Time: 03/17/22 12:47 PM  Result Value Ref Range   Magnesium 2.2 1.7 - 2.4 mg/dL    Comment: Performed at [facility], [address] [place], [place] [address]  Basic metabolic panel     Status: Abnormal   Collection Time: 03/17/22 12:47 PM  Result Value Ref Range   Sodium 140 135 - 145 mmol/L   Potassium 3.7 3.5 - 5.1 mmol/L   Chloride 108 98 - 111 mmol/L   CO2 26 22 - 32 mmol/L   Glucose, Bld 117 (H) 70 - 99 mg/dL    Comment: Glucose reference range applies only to samples taken after fasting for at least 8 hours.   BUN 20 8 - 23 mg/dL   Creatinine, Ser  0.85 0.61 - 1.24 mg/dL   Calcium 8.5 (L) 8.9 - 10.3 mg/dL   GFR, Estimated >60 >60 mL/min    Comment: (NOTE) Calculated using the CKD-EPI Creatinine Equation (2021)    Anion gap 6 5 - 15    Comment: Performed at [facility], [address] [place], [place] [address]  CBC with Differential     Status: Abnormal   Collection Time: 03/17/22 12:47 PM  Result Value Ref Range   WBC 9.6 4.0 - 10.5 K/uL   RBC 3.43 (L) 4.22 - 5.81 MIL/uL   Hemoglobin 11.2 (L) 13.0 - 17.0 g/dL   HCT 34.8 (L) 39.0 - 52.0 %   MCV 101.5 (H) 80.0 -  100.0 fL   MCH 32.7 26.0 - 34.0 pg   MCHC 32.2 30.0 - 36.0 g/dL   RDW 15.7 (H) 11.5 - 15.5 %   Platelets 217 150 - 400 K/uL   nRBC 0.0 0.0 - 0.2 %   Neutrophils Relative % 38 %   Neutro Abs 3.6 1.7 - 7.7 K/uL   Lymphocytes Relative 50 %   Lymphs Abs 4.9 (H) 0.7 - 4.0 K/uL   Monocytes Relative 5 %   Monocytes Absolute 0.5 0.1 - 1.0 K/uL   Eosinophils Relative 5 %   Eosinophils Absolute 0.5 0.0 - 0.5 K/uL   Basophils Relative 1 %   Basophils Absolute 0.1 0.0 - 0.1 K/uL   Immature Granulocytes 1 %   Abs Immature Granulocytes 0.08 (H) 0.00 - 0.07 K/uL    Comment: Performed at [facility], [address] [place], [place] [address]  POCT rapid strep A     Status: None   Collection Time: 04/15/22 11:28 AM  Result Value Ref Range   Rapid Strep A Screen Negative Negative  POCT Influenza A/B     Status: None   Collection Time: 04/15/22 11:53 AM  Result Value Ref Range   Influenza A, POC Negative Negative   Influenza B, POC Negative Negative  POC COVID-19     Status: None   Collection Time: 04/15/22 11:53 AM  Result Value Ref Range   SARS Coronavirus 2 Ag Negative Negative  COVID-19, Flu A+B and RSV     Status: None   Collection Time: 04/15/22 11:58 AM   Specimen: Nasal Swab   Nasal Swab  Previously  Result Value Ref Range   SARS-CoV-2, NAA Not Detected Not Detected   Influenza A, NAA Not Detected Not Detected   Influenza B, NAA Not Detected  Not Detected   RSV, NAA Not Detected Not Detected   Test Information: Comment     Comment: This nucleic acid amplification test was developed and its performance characteristics determined by [employer]. Nucleic acid amplification tests include RT-PCR and TMA. This test has not been FDA cleared or approved. This test has been authorized by FDA under an Emergency Use Authorization (EUA). This test is only authorized for the duration of time the declaration that circumstances exist justifying the authorization of the emergency use of in vitro diagnostic tests for detection of SARS-CoV-2 virus and/or diagnosis of COVID-19 infection under section 564(b)(1) of the Act, 21 U.S.C. 242AST-4(H) (1), unless the authorization is terminated or revoked sooner. When diagnostic testing is negative, the possibility of a false negative result should be considered in the context of a patient's recent exposures and the presence of clinical signs and symptoms consistent with COVID-19. An individual without symptoms of COVID-19 and who is not shedding SARS-CoV-2 virus wo uld expect to have a negative (not detected) result in this assay.    Objective  Body mass index is 29.86 kg/m. Wt Readings from Last 3 Encounters:  04/17/22 202 lb 3.2 oz (91.7 kg)  04/15/22 203 lb 3.2 oz (92.2 kg)  04/07/22 206 lb 6.4 oz (93.6 kg)   Temp Readings from Last 3 Encounters:  04/17/22 98.2 F (36.8 C) (Oral)  04/15/22 97.8 F (36.6 C) (Oral)  04/07/22 98.1 F (36.7 C) (Oral)   BP Readings from Last 3 Encounters:  04/17/22 108/60  04/15/22 102/60  04/07/22 110/70   Pulse Readings from Last 3 Encounters:  04/17/22 81  04/15/22 72  04/07/22 80    Physical Exam Vitals and  nursing note reviewed.  Constitutional:      Appearance: Normal appearance. He is well-developed and well-groomed.  HENT:     Head: Normocephalic and atraumatic.  Eyes:     Conjunctiva/sclera: Conjunctivae normal.     Pupils:  Pupils are equal, round, and reactive to light.  Cardiovascular:     Rate and Rhythm: Normal rate and regular rhythm.     Heart sounds: Normal heart sounds.  Pulmonary:     Effort: Pulmonary effort is normal. No respiratory distress.     Breath sounds: Normal breath sounds.  Abdominal:     Tenderness: There is no abdominal tenderness.  Lymphadenopathy:     Head:     Right side of head: Submental adenopathy present.     Left side of head: Submental adenopathy present.     Cervical: Cervical adenopathy present.     Right cervical: Superficial cervical adenopathy present.     Left cervical: Superficial cervical adenopathy present.  Skin:    General: Skin is warm and moist.       Neurological:     General: No focal deficit present.     Mental Status: He is alert and oriented to person, place, and time. Mental status is at baseline.     Sensory: Sensation is intact.     Motor: Motor function is intact.     Coordination: Coordination is intact.     Gait: Gait is intact. Gait normal.     Comments: Bl walks with cane  Psychiatric:        Attention and Perception: Attention and perception normal.        Mood and Affect: Mood and affect normal.        Speech: Speech normal.        Behavior: Behavior normal. Behavior is cooperative.        Thought Content: Thought content normal.        Cognition and Memory: Cognition and memory normal.        Judgment: Judgment normal.     Assessment  Plan  Sialadenitis  Urgent Ent referral pending  Hydration  Held on Rx Abx due to recent C diff with Augmentin in 01/2022  Was thinking about zpack but held for now due to above and pt to try below he is okay with the plan for now   To relieve discomfort Follow these instructions every few hours: Suck on a lemon candy or a vitamin C lozenge to prompt the flow of saliva. Put a warm, wet cloth (warmcompress) over the gland. Gently massage the gland. Gargle with a mixture of salt and water 3-4  times a day or as needed. To make salt water, completely dissolve -1 tsp (3-6 g) of salt in 1 cup (237 mL) of warm water.  Provider: Dr. Olivia Mackie McLean-Scocuzza-Internal Medicine

## 2022-04-17 NOTE — Patient Instructions (Addendum)
Salivary Gland Infection/inflammation  Use sugar free lemon candy   Dr. Tami Ribas   Phone Fax E-mail Address  [phone] Not available [place] [address]     Specialties     Otolaryngology         A salivary gland infection is an infection in one or more of the glands that make saliva. There are six major salivary glands. Each gland has a tube (duct) that carries saliva into the mouth. Saliva keeps the mouth moist and breaks down the food that a person eats. It also helps prevent tooth decay. You have two salivary glands just in front of your ears (parotid). The ducts for these glands open up inside your cheeks, near your back teeth. You also have two glands under your tongue (sublingual) and two glands under your jaw (submandibular). The ducts for these glands open under your tongue. Any salivary gland can get infected. Most infections occur in the parotid glands or submandibular glands. What are the causes? This condition may be caused by bacteria or viruses. The bacteria that cause salivary gland infections are usually the same bacteria that normally live in your mouth. A stone can form in a salivary gland and block the flow of saliva. Bacteria may then start to grow behind the blockage and cause infection. Bacterial infections usually cause pain and swelling on one side of the face. Submandibular gland swelling occurs under the jaw. Parotid swelling occurs in front of the ear. Bacterial infections are more common in adults. The most common cause of viral salivary gland infections is the mumps virus. However, vaccination has made mumps rare. This infection causes swelling in both parotid glands. Viral infections are more common in children. What increases the risk? You are more likely to develop a bacterial infection if: You do not take good care of your mouth and teeth (have poor oral hygiene). You smoke. You do not drink enough  water. You have a disease that causes dry mouth and dry eyes (Mikulicz syndrome or Sjgren syndrome). A viral infection is more likely to occur in children who do not get the measles, mumps, and rubella (MMR) vaccine. What are the signs or symptoms? The main sign of a salivary gland infection is sialadenitis, which is swelling in a salivary gland. You may have swelling in front of your ear, under your jaw, or under your tongue. Swelling may get worse when you eat and decrease after you eat. Other signs and symptoms include: Pain. Tenderness. Redness. Dry mouth. Bad taste in your mouth. Trouble chewing and swallowing. Fever. How is this diagnosed? This condition may be diagnosed based on: Your signs and symptoms. A physical exam. During the exam, your health care provider will look and feel inside your mouth to see whether a stone is blocking a salivary gland duct. Tests, such as: An X-ray to check for a stone. An ultrasound, CT scan, or MRI to look for an infected gland (abscess) and to rule out other causes of swelling. A culture and sensitivity test. In this test, a sample of pus is taken from the salivary gland by using a swab or a needle (aspiration). The sample is tested in a lab to determine the type of bacteria involved and which antibiotic medicines will work against it. You may need to see an ear, nose, and throat specialist (ENT or otolaryngologist) for diagnosis and treatment. How is this treated? Viral salivary gland infections usually clear up  without treatment. Bacterial infections are usually treated with antibiotic medicine. Severe infections that cause trouble swallowing may be treated with an IV antibiotic in the hospital. Other treatments may include: Probing and widening the salivary duct to let a stone pass. In some cases, a thin, flexible scope (endoscope) may be put into the duct to find a stone and remove it. Breaking up a stone using sound waves. Draining an abscess  with a needle. Surgery to: Remove a stone. Drain pus from an abscess. Remove a gland that has a bad or long-term infection. Follow these instructions at home: Medicines Take over-the-counter and prescription medicines only as told by your health care provider. If you were prescribed an antibiotic medicine, take it as told by your health care provider. Do not stop taking the antibiotic even if you start to feel better. To relieve discomfort Follow these instructions every few hours: Suck on a lemon candy or a vitamin C lozenge to prompt the flow of saliva. Put a warm, wet cloth (warmcompress) over the gland. Gently massage the gland. Gargle with a mixture of salt and water 3-4 times a day or as needed. To make salt water, completely dissolve -1 tsp (3-6 g) of salt in 1 cup (237 mL) of warm water. General instructions  Practice good oral hygiene by brushing and flossing your teeth after meals and before you go to bed. Drink enough fluid to keep your urine pale yellow. Do not use any products that contain nicotine or tobacco. These products include cigarettes, chewing tobacco, and vaping devices, such as e-cigarettes. If you need help quitting, ask your health care provider. Keep all follow-up visits. This is important. Contact a health care provider if: You have pain and swelling in your face, jaw, or mouth after eating. You keep having swelling in any of these places: In front of your ear. Under your jaw. Inside your mouth. Get help right away if: You have pain and swelling in your face, jaw, or mouth that suddenly gets worse. Your pain and swelling make it hard to swallow, talk, or breathe. These symptoms may be an emergency. Get help right away. Call 911. Do not wait to see if the symptoms will go away. Do not drive yourself to the hospital. Summary A salivary gland infection is an infection in one or more of the glands that make saliva. Any salivary gland can get infected. This  condition may be caused by bacteria or viruses. Salivary gland infections caused by a virus usually clear up without treatment. Bacterial infections are usually treated with antibiotic medicine. This information is not intended to replace advice given to you by your health care provider. Make sure you discuss any questions you have with your health care provider. Document Revised: 07/03/2021 Document Reviewed: 07/03/2021 Elsevier Patient Education  [place].

## 2022-04-21 NOTE — Telephone Encounter (Signed)
[place] Update on ENT referral?

## 2022-04-25 ENCOUNTER — Telehealth: Payer: Self-pay | Admitting: Family

## 2022-04-25 NOTE — Telephone Encounter (Signed)
Call [facility]  Pt has appt 05/07/22  Are there any sooner appts for sialadenitis? He has gland swelling for a couple of weeks. History of leukemia.   He was also hospitalized in July for C. Difficile after augmentin.  I was very hesitant to start antibiotic.   Who is pt seeing?  Let me know what you find out.     I spoke with pt today  He thinks may be better ,Slightly. He doesn't think worse.  No sore throat, trouble swallowing, choking, fever.  He is drinking and eating normally.

## 2022-04-28 NOTE — Telephone Encounter (Signed)
Spoke to [place] at [facility] and she was able to move appt from 05/07/22  to 05/02/22 so I called pt to in form him of the change but pt stated that he has Cardiology appt on that day 05/02/22 and can not make that appt. I then called back to [facility] and spoke to [place] again and she was able to cancel that one and give him the original appt back for 10/18. Pt  was notified.

## 2022-04-30 ENCOUNTER — Other Ambulatory Visit: Payer: Self-pay | Admitting: Pharmacist

## 2022-04-30 DIAGNOSIS — C9110 Chronic lymphocytic leukemia of B-cell type not having achieved remission: Secondary | ICD-10-CM

## 2022-04-30 MED ORDER — ACALABRUTINIB MALEATE 100 MG PO TABS: 100.0000 mg | ORAL_TABLET | Freq: Two times a day (BID) | ORAL | 2 refills | Status: DC

## 2022-05-02 ENCOUNTER — Inpatient Hospital Stay: Payer: Medicare HMO | Attending: Internal Medicine

## 2022-05-02 ENCOUNTER — Other Ambulatory Visit: Payer: No Typology Code available for payment source

## 2022-05-02 ENCOUNTER — Encounter: Payer: Self-pay | Admitting: Internal Medicine

## 2022-05-02 ENCOUNTER — Inpatient Hospital Stay (HOSPITAL_BASED_OUTPATIENT_CLINIC_OR_DEPARTMENT_OTHER): Payer: Medicare HMO | Admitting: Internal Medicine

## 2022-05-02 ENCOUNTER — Ambulatory Visit: Payer: No Typology Code available for payment source | Admitting: Internal Medicine

## 2022-05-02 DIAGNOSIS — C9110 Chronic lymphocytic leukemia of B-cell type not having achieved remission: Secondary | ICD-10-CM

## 2022-05-02 DIAGNOSIS — Z87891 Personal history of nicotine dependence: Secondary | ICD-10-CM | POA: Diagnosis not present

## 2022-05-02 DIAGNOSIS — M2548 Effusion, other site: Secondary | ICD-10-CM | POA: Insufficient documentation

## 2022-05-02 DIAGNOSIS — I4891 Unspecified atrial fibrillation: Secondary | ICD-10-CM | POA: Insufficient documentation

## 2022-05-02 DIAGNOSIS — G8929 Other chronic pain: Secondary | ICD-10-CM | POA: Insufficient documentation

## 2022-05-02 DIAGNOSIS — Z79899 Other long term (current) drug therapy: Secondary | ICD-10-CM | POA: Insufficient documentation

## 2022-05-02 DIAGNOSIS — M2550 Pain in unspecified joint: Secondary | ICD-10-CM | POA: Insufficient documentation

## 2022-05-02 DIAGNOSIS — Z8673 Personal history of transient ischemic attack (TIA), and cerebral infarction without residual deficits: Secondary | ICD-10-CM | POA: Diagnosis not present

## 2022-05-02 DIAGNOSIS — Z7901 Long term (current) use of anticoagulants: Secondary | ICD-10-CM | POA: Insufficient documentation

## 2022-05-02 DIAGNOSIS — D649 Anemia, unspecified: Secondary | ICD-10-CM | POA: Insufficient documentation

## 2022-05-02 LAB — CBC WITH DIFFERENTIAL/PLATELET
Abs Immature Granulocytes: 0.02 10*3/uL (ref 0.00–0.07)
Basophils Absolute: 0 10*3/uL (ref 0.0–0.1)
Basophils Relative: 1 %
Eosinophils Absolute: 0.2 10*3/uL (ref 0.0–0.5)
Eosinophils Relative: 3 %
HCT: 32.2 % — ABNORMAL LOW (ref 39.0–52.0)
Hemoglobin: 10.5 g/dL — ABNORMAL LOW (ref 13.0–17.0)
Immature Granulocytes: 0 %
Lymphocytes Relative: 58 %
Lymphs Abs: 5.1 10*3/uL — ABNORMAL HIGH (ref 0.7–4.0)
MCH: 33.3 pg (ref 26.0–34.0)
MCHC: 32.6 g/dL (ref 30.0–36.0)
MCV: 102.2 fL — ABNORMAL HIGH (ref 80.0–100.0)
Monocytes Absolute: 0.6 10*3/uL (ref 0.1–1.0)
Monocytes Relative: 6 %
Neutro Abs: 2.7 10*3/uL (ref 1.7–7.7)
Neutrophils Relative %: 32 %
Platelets: 194 10*3/uL (ref 150–400)
RBC: 3.15 MIL/uL — ABNORMAL LOW (ref 4.22–5.81)
RDW: 15.4 % (ref 11.5–15.5)
WBC: 8.6 10*3/uL (ref 4.0–10.5)
nRBC: 0 % (ref 0.0–0.2)

## 2022-05-02 LAB — COMPREHENSIVE METABOLIC PANEL
ALT: 14 U/L (ref 0–44)
AST: 17 U/L (ref 15–41)
Albumin: 3.7 g/dL (ref 3.5–5.0)
Alkaline Phosphatase: 93 U/L (ref 38–126)
Anion gap: 5 (ref 5–15)
BUN: 25 mg/dL — ABNORMAL HIGH (ref 8–23)
CO2: 28 mmol/L (ref 22–32)
Calcium: 8.7 mg/dL — ABNORMAL LOW (ref 8.9–10.3)
Chloride: 107 mmol/L (ref 98–111)
Creatinine, Ser: 1.11 mg/dL (ref 0.61–1.24)
GFR, Estimated: >60 mL/min (ref >60)
Glucose, Bld: 115 mg/dL — ABNORMAL HIGH (ref 70–99)
Potassium: 3.9 mmol/L (ref 3.5–5.1)
Sodium: 140 mmol/L (ref 135–145)
Total Bilirubin: 0.9 mg/dL (ref 0.3–1.2)
Total Protein: 7.6 g/dL (ref 6.5–8.1)

## 2022-05-02 LAB — LACTATE DEHYDROGENASE: LDH: 108 U/L (ref 98–192)

## 2022-05-02 NOTE — Assessment & Plan Note (Addendum)
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  MAY 27th, 2023- CT neck- Increased adenopathy above the diaphragm with stable prominent/mildly enlarged lymph nodes below the diaphragm and no splenomegaly.  # On Acalabrutnib [AUG 16th, 2023]- SEP 27th, 2023- CT neck improvement of swelling LN- will repeat CT scan CAP in [place].  Tolerating well continue the same.  Patient admits to compliance.  #  Right neck swelling: CT scan SEP 2023-Mild inflammatory stranding adjacent to the right greater than left submandibular glands. NO acutesigns of acute sialadenitis. No salivary stone or mass.Awaiting ENT evaluation on 10/18.   # recent C.Diff- colitis- s/p Vancomycin PO -monitor closely.hold off antibiotics unless absolutely necessary.   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   # Low immunoglobulins IgG 330/recent severe respiratory infection/CLL-s/pI IVIG #4/4 [last April 2022].NOV 2022- IgG > 500. Pending results from today-   # A.fib-on amiodarone [ Dr.Gollan] sinus rhythm-on Eliquis/CHF- lasix 40/d- s/p Cath [AUG, 2022- Echo-55%] -STABLE.  # MIld Anemia Hb ~10-11-  STABLE; will check iron studies W66 folic acid ferritin next visit..   # Joint pains/ Muscle pain- continue tramadol/CBD prn; refilled-  -STABLE  # Vaccination: s/p Flu shot.   DISPOSITION: #  follow up in 1 month-  MD; labs- cbc/cmp; OUM;N61 level; folic acid; retic count ; iron studies,ferritin-- Dr.B

## 2022-05-02 NOTE — Progress Notes (Signed)
[name] OFFICE PROGRESS NOTE  Patient Care Team: Leone Haven, MD as PCP - General (Family Medicine) Rockey Situ Kathlene November, MD as PCP - Cardiology (Cardiology) [name], MD as Consulting Physician (Cardiology) Cammie Sickle, MD as Medical Oncologist (Hematology and Oncology) Gloris Ham, RN as Registered Nurse (Oncology)   Cancer Staging  No matching staging information was found for the patient.   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  MARCH 2017- CT N/C/A/P- NED. STOP Ritux; surveillance   # AUG 2019- CT/PET- progression/NO transformation;  # NOV 2019- Progression; started Ibrutinib 420 mg/d. STOPPED in end of feb sec to extreme fatigue/joint pains/cramps  #November 01, 2018-start ibrutinib 280 mg a day; December 20, 2018-discontinue ibrutinib secondary multiple side effects.   #January 03, 2019 start Gazyva + Ven [July]; finished Dec 2020-; started [facility] [employer]cal maintenance [200 mg a day; DI-amiodarone]; SEP 2022- congestive heart failure; s/p cardiac cath-noted to have CAD however no stents placed.  2D echo- EF-55%  #Mid March 2021-venetoclax dose reduced to 100 mg [second diarrhea]. HELD in [place] 2023 x3 months-multiple admissions to the hospital/pneumonia infection stroke x 3 m.   # MAY 2023-progression of disease in the neck ches; Nov 18, 2021-RE-started venetoclax;   # JUNE 29th, 2023-progression of disease in the neck- STOP VENAOCLAX;   # AUG 16th, 2023 START ACALABRUTINIB 100 mg BID [previous intolerance to ibrutinib; delayed sec to C.diff]    #? 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    Ambulating with a cane.   [name] 76 y.o.  male CLL-high risk of recurrent/relapsed currently on on alcabrutinib is here for a follow up.   In the interim patient was evaluated by PCP regarding enlarged glands in his neck.   Patient still complains of his glands being enlarged.  Patient denies any pain unless glands are touched. Patient  has an appt with ENT on 10/18.   Denies any further episodes of C. difficile. Complains of chronic joint pain.  Otherwise no fever no chills.  No nausea no vomiting.  No headache. No night sweats.  \Review of Systems  Constitutional:  Positive for malaise/fatigue. Negative for chills, diaphoresis and fever.  HENT:  Negative for nosebleeds and sore throat.   Eyes:  Negative for double vision.  Respiratory:  Negative for hemoptysis, shortness of breath and wheezing.   Cardiovascular:  Negative for chest pain, palpitations, orthopnea and leg swelling.  Gastrointestinal:  Positive for diarrhea. Negative for abdominal pain, blood in stool, constipation, heartburn, melena, nausea and vomiting.  Genitourinary:  Negative for dysuria, frequency and urgency.  Musculoskeletal:  Positive for back pain, joint pain and myalgias.  Skin: Negative.  Negative for itching and rash.  Neurological:  Negative for dizziness, tingling, focal weakness and headaches.  Psychiatric/Behavioral:  Negative for depression. The patient is not nervous/anxious and does not have insomnia.       PAST MEDICAL HISTORY :  Past  Medical History:  Diagnosis Date   Anxiety    Arthritis    Atrial fibrillation ([place])    a. Dx 2013, recurred 02/2014, CHA2DS2VASc = 3 -->placed on Eliquis;  b. 02/2014 Echo: EF 50-55%, mid and apical anterior septum and mid and apical inf septum are abnl, mild to mod Ao sclerosis w/o AS.   Cancer associated pain    Chicken pox    Chronic lymphocytic leukemia ([place])    a. Dx 02/2014.   CLL (chronic lymphocytic leukemia) ([facility])    Complication of anesthesia    History of  PTSD--do not touch patient when waking up from surgery.   COPD (chronic obstructive pulmonary disease) ([facility])    Coronary artery disease    a. 04/2009 CABG x 3 (LIMA->LAD, VG->OM1, VG->PDA);  b. 09/2009 Cath: occluded VG x 2 w/ patent LIMA and L->R collats. EF 55%, mild antlat HK;  c. 10/2011 MV: EF 53%, no isch/infarct-->low risk.   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   TIA (transient ischemic attack) 11/02/2015    PAST SURGICAL HISTORY :   Past Surgical History:  Procedure Laterality Date   ABDOMINAL HERNIA REPAIR     APPENDECTOMY  06/21/1985   CARDIAC CATHETERIZATION  2010; 2011   ; Dr Fletcher Anon   CORONARY ARTERY BYPASS GRAFT  04/2009   "CABG X3"   EP IMPLANTABLE DEVICE N/A 03/03/2016   Procedure: Pacemaker Implant;  Surgeon: Deboraha Sprang, MD;  Location: [facility];  Service: Cardiovascular;  Laterality: N/A;   FOREIGN BODY REMOVAL  1968   "shrapnel in my tailbone"   INGUINAL HERNIA REPAIR Right    INSERT / REPLACE / [place] Right  2018   LAPAROSCOPIC CHOLECYSTECTOMY     RIGHT/LEFT HEART CATH AND CORONARY/GRAFT ANGIOGRAPHY N/A 02/04/2021   Procedure: RIGHT/LEFT HEART CATH AND CORONARY/GRAFT ANGIOGRAPHY;  Surgeon: Nelva Bush, MD;  Location: [facility];  Service: Cardiovascular;  Laterality: N/A;   TONSILLECTOMY AND ADENOIDECTOMY  1956   TOTAL HIP ARTHROPLASTY Right 10/22/2016   Procedure: TOTAL HIP ARTHROPLASTY;  Surgeon: Dereck Leep, MD;  Location: [facility];  Service: Orthopedics;  Laterality: Right;   TOTAL HIP ARTHROPLASTY Left 11/04/2017   Procedure: TOTAL HIP ARTHROPLASTY;  Surgeon: Dereck Leep, MD;  Location: [facility];  Service: Orthopedics;  Laterality: Left;    FAMILY HISTORY :   Family History  Problem Relation Age of Onset   Heart disease Mother    Heart attack Mother    Coronary artery disease Other        family history    SOCIAL HISTORY:   Social History   Tobacco Use   Smoking status: Former    Packs/day: 1.00  Years: 40.00    Total pack years: 40.00    Types: Cigarettes    Quit date: 07/21/2006    Years since quitting: 15.7   Smokeless tobacco: Never  Vaping Use   Vaping Use: Former  Substance Use Topics   Alcohol use: Not Currently   Drug use: No    ALLERGIES:  is allergic to augmentin [amoxicillin-pot clavulanate].  MEDICATIONS:  Current Outpatient Medications  Medication Sig Dispense Refill   acalabrutinib maleate (CALQUENCE) 100 MG tablet Take 1 tablet (100 mg total) by mouth 2 (two) times daily. 60 tablet 2   acetaminophen (TYLENOL) 325 MG tablet Take 1-2 tablets (325-650 mg total) by mouth every 4 (four) hours as needed for mild pain.     acyclovir (ZOVIRAX) 400 MG tablet TAKE 1 TABLET(400 MG) BY MOUTH TWICE DAILY 60 tablet 6   albuterol (VENTOLIN HFA) 108 (90 Base) MCG/ACT inhaler Inhale 2 puffs into the lungs every 6 (six) hours as needed for wheezing or shortness of breath. 1 each 0   amiodarone (PACERONE) 200 MG tablet Take 1/2 tablet (100 mg) by mouth  twice daily 5 days a week 60 tablet 6   apixaban (ELIQUIS) 5 MG TABS tablet Take 5 mg by mouth 2 (two) times daily.     azelastine (ASTELIN) 0.1 % nasal spray Place 2 sprays into both nostrils 2 (two) times daily. Use in each nostril as directed 30 mL 1   budesonide-formoterol (SYMBICORT) 160-4.5 MCG/ACT inhaler Inhale 2 puffs into the lungs 2 (two) times daily. 1 Inhaler 11   cetirizine (ZYRTEC) 10 MG tablet Take 10 mg by mouth daily as needed for allergies.      Coenzyme Q10 (COQ10) 200 MG CAPS Take 200 mg by mouth daily.     diphenoxylate-atropine (LOMOTIL) 2.5-0.025 MG tablet Take 1 tablet by mouth 4 (four) times daily as needed for diarrhea or loose stools. 45 tablet 0   ezetimibe (ZETIA) 10 MG tablet Take 1 tablet (10 mg total) by mouth daily. 90 tablet 3   furosemide (LASIX) 40 MG tablet Take 1 tablet (40 mg total) by mouth daily. 30 tablet 0   Krill Oil 350 MG CAPS Take 350 mg by mouth daily as needed (Heart).     lidocaine (XYLOCAINE) 2 % solution Use as directed 15 mLs in the mouth or throat every 3 (three) hours as needed for mouth pain (gargle; may spit or swallow). 100 mL 0   mirtazapine (REMERON) 15 MG tablet Take 15 mg by mouth at bedtime as needed (for panic associated with PTSD).      Multiple Vitamin (MULTIVITAMIN WITH MINERALS) TABS tablet Take 1 tablet by mouth daily.     nitroGLYCERIN (NITROSTAT) 0.4 MG SL tablet Place 1 tablet (0.4 mg total) under the tongue every 5 (five) minutes as needed for chest pain. (Patient taking differently: Place 0.4 mg under the tongue every 5 (five) minutes as needed for chest pain. Has not needed) 25 tablet 6   PRALUENT 150 MG/ML SOAJ INJECT 150 MG UNDER THE SKIN EVERY 14 DAYS 2 mL 2   Tiotropium Bromide Monohydrate 2.5 MCG/ACT AERS Inhale 2 puffs into the lungs at bedtime. Spiriva     traMADol (ULTRAM) 50 MG tablet Take 1 tablet (50 mg total) by mouth every 12 (twelve) hours as needed. 60 tablet 0   No current facility-administered medications  for this visit.    PHYSICAL EXAMINATION: ECOG PERFORMANCE STATUS: 1 - Symptomatic but completely ambulatory  BP 106/62 (BP Location: Right Arm,  Patient Position: Sitting)   Pulse 60   Temp (!) 96.6 F (35.9 C) (Tympanic)   Wt 202 lb 3.2 oz (91.7 kg)   SpO2 99%   BMI 29.86 kg/m   Filed Weights   05/02/22 0836  Weight: 202 lb 3.2 oz (91.7 kg)   Vague swelling noted in the right submandibular area.  Nontender. Physical Exam HENT:     Head: Normocephalic and atraumatic.     Mouth/Throat:     Pharynx: No oropharyngeal exudate.  Eyes:     Pupils: Pupils are equal, round, and reactive to light.  Cardiovascular:     Rate and Rhythm: Normal rate and regular rhythm.  Pulmonary:     Effort: No respiratory distress.     Breath sounds: No wheezing.  Abdominal:     General: Bowel sounds are normal. There is no distension.     Palpations: Abdomen is soft. There is no mass.     Tenderness: There is no abdominal tenderness. There is no guarding or rebound.  Musculoskeletal:        General: No tenderness. Normal range of motion.     Cervical back: Normal range of motion and neck supple.  Skin:    General: Skin is warm.  Neurological:     Mental Status: He is alert and oriented to person, place, and time.  Psychiatric:        Mood and Affect: Affect normal.     LABORATORY DATA:  I have reviewed the data as listed    Component Value Date/Time   NA 140 05/02/2022 0813   NA 143 03/11/2021 1136   NA 139 10/11/2014 1800   K 3.9 05/02/2022 0813   K 3.3 (L) 10/11/2014 1800   CL 107 05/02/2022 0813   CL 106 10/11/2014 1800   CO2 28 05/02/2022 0813   CO2 27 10/11/2014 1800   GLUCOSE 115 (H) 05/02/2022 0813   GLUCOSE 107 (H) 10/11/2014 1800   BUN 25 (H) 05/02/2022 0813   BUN 13 03/11/2021 1136   BUN 15 10/11/2014 1800   CREATININE 1.11 05/02/2022 0813   CREATININE 0.89 10/11/2014 1800   CALCIUM 8.7 (L) 05/02/2022 0813   CALCIUM 8.8 (L) 10/11/2014 1800   PROT 7.6 05/02/2022  0813   PROT 6.7 05/18/2017 1048   PROT 6.4 (L) 10/11/2014 1800   ALBUMIN 3.7 05/02/2022 0813   ALBUMIN 4.3 05/18/2017 1048   ALBUMIN 4.1 10/11/2014 1800   AST 17 05/02/2022 0813   AST 23 10/11/2014 1800   ALT 14 05/02/2022 0813   ALT 22 10/11/2014 1800   ALKPHOS 93 05/02/2022 0813   ALKPHOS 61 10/11/2014 1800   BILITOT 0.9 05/02/2022 0813   BILITOT 0.7 05/18/2017 1048   BILITOT 0.9 10/11/2014 1800   GFRNONAA >60 05/02/2022 0813   GFRNONAA >60 10/11/2014 1800   GFRAA >60 04/12/2020 0959   GFRAA >60 10/11/2014 1800    No results found for: "SPEP", "UPEP"  Lab Results  Component Value Date   WBC 8.6 05/02/2022   NEUTROABS 2.7 05/02/2022   HGB 10.5 (L) 05/02/2022   HCT 32.2 (L) 05/02/2022   MCV 102.2 (H) 05/02/2022   PLT 194 05/02/2022      Chemistry      Component Value Date/Time   NA 140 05/02/2022 0813   NA 143 03/11/2021 1136   NA 139 10/11/2014 1800   K 3.9 05/02/2022 0813   K 3.3 (L) 10/11/2014 1800   CL 107 05/02/2022 0813   CL 106  10/11/2014 1800   CO2 28 05/02/2022 0813   CO2 27 10/11/2014 1800   BUN 25 (H) 05/02/2022 0813   BUN 13 03/11/2021 1136   BUN 15 10/11/2014 1800   CREATININE 1.11 05/02/2022 0813   CREATININE 0.89 10/11/2014 1800      Component Value Date/Time   CALCIUM 8.7 (L) 05/02/2022 0813   CALCIUM 8.8 (L) 10/11/2014 1800   ALKPHOS 93 05/02/2022 0813   ALKPHOS 61 10/11/2014 1800   AST 17 05/02/2022 0813   AST 23 10/11/2014 1800   ALT 14 05/02/2022 0813   ALT 22 10/11/2014 1800   BILITOT 0.9 05/02/2022 0813   BILITOT 0.7 05/18/2017 1048   BILITOT 0.9 10/11/2014 1800       RADIOGRAPHIC STUDIES: I have personally reviewed the radiological images as listed and agreed with the findings in the report. No results found.   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  MAY 27th, 2023- CT neck- Increased adenopathy above the diaphragm with stable prominent/mildly  enlarged lymph nodes below the diaphragm and no splenomegaly.  # On Acalabrutnib [AUG 16th, 2023]- SEP 27th, 2023- CT neck improvement of swelling LN- will repeat CT scan CAP in [place].  Tolerating well continue the same.  Patient admits to compliance.  #  Right neck swelling: CT scan SEP 2023-Mild inflammatory stranding adjacent to the right greater than left submandibular glands. NO acutesigns of acute sialadenitis. No salivary stone or mass.Awaiting ENT evaluation on 10/18.   # recent C.Diff- colitis- s/p Vancomycin PO -monitor closely.hold off antibiotics unless absolutely necessary.   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   # Low immunoglobulins IgG 330/recent severe respiratory infection/CLL-s/pI IVIG #4/4 [last April 2022].NOV 2022- IgG > 500. Pending results from today-   # A.fib-on amiodarone [ Dr.Gollan] sinus rhythm-on Eliquis/CHF- lasix 40/d- s/p Cath [AUG, 2022- Echo-55%] -STABLE.  # MIld Anemia Hb ~10-11-  STABLE; will check iron studies Y60 folic acid ferritin next visit..   # Joint pains/ Muscle pain- continue tramadol/CBD prn; refilled-  -STABLE  # Vaccination: s/p Flu shot.   DISPOSITION: #  follow up in 1 month-  MD; labs- cbc/cmp; YTK;Z60 level; folic acid; retic count ; iron studies,ferritin-- Dr.B            Orders Placed This Encounter  Procedures   CBC with Differential/Platelet    Standing Status:   Future    Standing Expiration Date:   05/02/2023   Comprehensive metabolic panel    Standing Status:   Future    Standing Expiration Date:   05/02/2023   Lactate dehydrogenase    Standing Status:   Future    Standing Expiration Date:   05/03/2023   Vitamin B12    Standing Status:   Future    Standing Expiration Date:   05/03/2023   Folate    Standing Status:   Future    Standing Expiration Date:   05/03/2023   Reticulocytes    Standing Status:   Future    Standing Expiration Date:   05/03/2023   Iron and TIBC([facility])     Standing Status:   Future    Standing Expiration Date:   05/03/2023   Ferritin    Standing Status:   Future    Standing Expiration Date:   05/03/2023    All questions were answered. The patient knows to call the clinic with any problems, questions or concerns.      Lenetta Quaker R  Rogue Bussing, MD 05/02/2022 9:38 AM

## 2022-05-02 NOTE — Progress Notes (Signed)
Patient here for follow up. Patient still complains of his glands being enlarges. Patient denies any pain unless glands are touched. Patient  has an appt with ENT on 10/18.

## 2022-05-05 LAB — IMMUNOGLOBULINS A/E/G/M, SERUM
IgA: 56 mg/dL — ABNORMAL LOW (ref 61–437)
IgE (Immunoglobulin E), Serum: 567 IU/mL — ABNORMAL HIGH (ref 6–495)
IgG (Immunoglobin G), Serum: 1605 mg/dL (ref 603–1613)
IgM (Immunoglobulin M), Srm: 60 mg/dL (ref 15–143)

## 2022-05-06 ENCOUNTER — Other Ambulatory Visit (HOSPITAL_COMMUNITY): Payer: Self-pay

## 2022-05-06 ENCOUNTER — Encounter: Payer: Self-pay | Admitting: Internal Medicine

## 2022-05-06 ENCOUNTER — Telehealth: Payer: Self-pay

## 2022-05-06 ENCOUNTER — Ambulatory Visit (INDEPENDENT_AMBULATORY_CARE_PROVIDER_SITE_OTHER): Payer: Medicare HMO

## 2022-05-06 DIAGNOSIS — I495 Sick sinus syndrome: Secondary | ICD-10-CM

## 2022-05-06 LAB — CUP PACEART REMOTE DEVICE CHECK
Battery Remaining Longevity: 47 mo
Battery Voltage: 2.97 V
Brady Statistic AP VP Percent: 0.43 %
Brady Statistic AP VS Percent: 49.62 %
Brady Statistic AS VP Percent: 0.1 %
Brady Statistic AS VS Percent: 49.85 %
Brady Statistic RA Percent Paced: 50.05 %
Brady Statistic RV Percent Paced: 0.57 %
Date Time Interrogation Session: 20231017163859
Implantable Lead Implant Date: 20170814
Implantable Lead Implant Date: 20170814
Implantable Lead Location: 753859
Implantable Lead Location: 753860
Implantable Lead Model: 5076
Implantable Lead Model: 5076
Implantable Pulse Generator Implant Date: 20170814
Lead Channel Impedance Value: 323 Ohm
Lead Channel Impedance Value: 437 Ohm
Lead Channel Impedance Value: 456 Ohm
Lead Channel Impedance Value: 532 Ohm
Lead Channel Pacing Threshold Amplitude: 0.75 V
Lead Channel Pacing Threshold Amplitude: 0.875 V
Lead Channel Pacing Threshold Pulse Width: 0.4 ms
Lead Channel Pacing Threshold Pulse Width: 0.4 ms
Lead Channel Sensing Intrinsic Amplitude: 15.5 mV
Lead Channel Sensing Intrinsic Amplitude: 15.5 mV
Lead Channel Sensing Intrinsic Amplitude: 2.25 mV
Lead Channel Sensing Intrinsic Amplitude: 2.25 mV
Lead Channel Setting Pacing Amplitude: 2 V
Lead Channel Setting Pacing Amplitude: 2.5 V
Lead Channel Setting Pacing Pulse Width: 0.4 ms
Lead Channel Setting Sensing Sensitivity: 0.9 mV

## 2022-05-06 NOTE — Telephone Encounter (Signed)
Oral Oncology Patient Advocate Encounter  Was successful in securing patient a $8000 grant from [facility] to provide copayment coverage for Calquence.  This will keep the out of pocket expense at $0.     [employer] ID: [id number]  I have spoken with the patient.   The billing information is as follows and has been shared with [facility].    RxBin: [id number] PCN: [id number] Member ID: [id number] Group ID: [id number] Dates of Eligibility: 09.17.23 through 09.16.24  Fund:  Chronic Lymphocytic [place], [place] Patient [place]  [phone] (phone) [phone] (fax) 05/06/2022 12:12 PM

## 2022-05-06 NOTE — Telephone Encounter (Signed)
Oral Oncology Patient Advocate Encounter   Was successful in securing patient an $41 grant from Patient [facility]) to provide copayment coverage for Calquence.  This will keep the out of pocket expense at $0.     I have spoken with the patient.    The billing information is as follows and has been shared with [place].   Member ID: [id number] Group ID: [id number] RxBin: [id number] Dates of Eligibility: 05/06/2022 through 05/06/2023  Fund:  Chronic Lymphocytic [place], [place] Patient [place]  [phone] (phone) [phone] (fax) 05/06/2022 12:34 PM

## 2022-05-07 ENCOUNTER — Encounter: Payer: Self-pay | Admitting: Otolaryngology

## 2022-05-19 ENCOUNTER — Emergency Department: Payer: No Typology Code available for payment source

## 2022-05-19 ENCOUNTER — Emergency Department
Admission: EM | Admit: 2022-05-19 | Discharge: 2022-05-19 | Discharge status: Against Medical Advice | Payer: No Typology Code available for payment source | Attending: Emergency Medicine | Admitting: Emergency Medicine

## 2022-05-19 ENCOUNTER — Telehealth: Payer: Self-pay | Admitting: Cardiovascular Disease

## 2022-05-19 DIAGNOSIS — R531 Weakness: Secondary | ICD-10-CM | POA: Diagnosis present

## 2022-05-19 DIAGNOSIS — Z5321 Procedure and treatment not carried out due to patient leaving prior to being seen by health care provider: Secondary | ICD-10-CM | POA: Insufficient documentation

## 2022-05-19 DIAGNOSIS — R42 Dizziness and giddiness: Secondary | ICD-10-CM | POA: Diagnosis not present

## 2022-05-19 DIAGNOSIS — R197 Diarrhea, unspecified: Secondary | ICD-10-CM | POA: Insufficient documentation

## 2022-05-19 LAB — BASIC METABOLIC PANEL
Anion gap: 6 (ref 5–15)
BUN: 23 mg/dL (ref 8–23)
CO2: 24 mmol/L (ref 22–32)
Calcium: 8.6 mg/dL — ABNORMAL LOW (ref 8.9–10.3)
Chloride: 107 mmol/L (ref 98–111)
Creatinine, Ser: 1.1 mg/dL (ref 0.61–1.24)
GFR, Estimated: >60 mL/min (ref >60)
Glucose, Bld: 106 mg/dL — ABNORMAL HIGH (ref 70–99)
Potassium: 3.6 mmol/L (ref 3.5–5.1)
Sodium: 137 mmol/L (ref 135–145)

## 2022-05-19 LAB — CBC
HCT: 31.3 % — ABNORMAL LOW (ref 39.0–52.0)
Hemoglobin: 10.3 g/dL — ABNORMAL LOW (ref 13.0–17.0)
MCH: 32.9 pg (ref 26.0–34.0)
MCHC: 32.9 g/dL (ref 30.0–36.0)
MCV: 100 fL (ref 80.0–100.0)
Platelets: 155 10*3/uL (ref 150–400)
RBC: 3.13 MIL/uL — ABNORMAL LOW (ref 4.22–5.81)
RDW: 15.1 % (ref 11.5–15.5)
WBC: 13 10*3/uL — ABNORMAL HIGH (ref 4.0–10.5)
nRBC: 0 % (ref 0.0–0.2)

## 2022-05-19 LAB — MAGNESIUM: Magnesium: 2.3 mg/dL (ref 1.7–2.4)

## 2022-05-19 NOTE — ED Triage Notes (Signed)
First nurse note:Patient arrived by EMS from home. C/o diarrhea and dizziness. Takes chemo pills twice daily. EMS reports ambulatory few steps with assistance.  EMS vitals: 121/58b/p 66HR 98% RA

## 2022-05-19 NOTE — ED Triage Notes (Signed)
See RN note at 05/19/22 at 1626.

## 2022-05-19 NOTE — ED Provider Triage Note (Signed)
Emergency Medicine Provider Triage Evaluation Note  SHAILEN THIELEN , a 76 y.o. male  was evaluated in triage.  Pt complains of weakness and diarrhea. On oral chemo. No reported fevers.  Review of Systems  Positive: Weakness, diarrhea Negative: Fever, emesis, CP, SOB  Physical Exam  BP 119/67 (BP Location: Left Arm)   Pulse 64   Temp 98.3 F (36.8 C) (Oral)   Resp 16   Wt 70.8 kg   SpO2 98%   BMI 23.04 kg/m  Gen:   Awake, no distress   Resp:  Normal effort  MSK:   Moves extremities without difficulty  Other:    Medical Decision Making  Medically screening exam initiated at 5:19 PM.  Appropriate orders placed.  MAYJOR AGER was informed that the remainder of the evaluation will be completed by another provider, this initial triage assessment does not replace that evaluation, and the importance of remaining in the ED until their evaluation is complete.  Patient will have labs, xray, covid/flu   Darletta Moll, PA-C 05/19/22 1721

## 2022-05-19 NOTE — Telephone Encounter (Signed)
Spoke with pt who states that on Saturday he went to a craftsman show and was very tired that evening. Pt states that he went to bed that evening at 9:00pm and did not wake up until the next evening at 3:00 pm. Pt states that he went to bed last pm at 10:30 and did not get up until this afternoon at 1:00. Pt reports that his bp was 96/54 and 87/45. Pt is taking Amiodarone 100 mg daily, zetia and furosemide 40 mg daily.   I also advised pt to call his oncologist to discuss sx as well. Pt is on Calquence 100 mg for cancer.   Please advise

## 2022-05-19 NOTE — Telephone Encounter (Signed)
Pt c/o BP issue: STAT if pt c/o blurred vision, one-sided weakness or slurred speech  1. What are your last 5 BP readings?  96/54 about 5 mins ago right arm 87/45 1:12pm  Isn't able to check it on left arm, BP cuff tells him error  2. Are you having any other symptoms (ex. Dizziness, headache, blurred vision, passed out)?  Is feeling dizzy, feeling weak, having trouble walking.   3. What is your BP issue? Low blood pressure.

## 2022-05-20 ENCOUNTER — Telehealth: Payer: Self-pay | Admitting: Family Medicine

## 2022-05-20 NOTE — Telephone Encounter (Signed)
Spoke with patient and he reports the following.  Saturday he went to craft fair and overdid it and was doing more than he should. He didn't have help and was trying to load truck. His legs gave out and he could not get up. When he got home he went to bed around 9 pm and slept until Saturday at 3:30 pm.  When he got up on Sunday he still did not feel well. His blood pressure was 88/41 and EMS was called. They recommended he go to ER. Arrived to ER and he reports the following. They did vitals, blood work, EKG, and chest xray. Then he was left in the lobby for a long time before he finally decided to leave. He is feeling some better now but is still concerned. Scheduled him to come in next week to see our APP. He was very appreciative for the call back. Reviewed strict ED precautions and confirmed date and time of appointment. He verbalized understanding.

## 2022-05-20 NOTE — Telephone Encounter (Signed)
I did request that he keep a log of both weights and blood pressures to bring to upcoming appointment.

## 2022-05-20 NOTE — Telephone Encounter (Signed)
This patient was in the ED yesterday though he left without being seen.  Please contact him and see how he is feeling today.  See what his blood pressure has been.  Thanks.

## 2022-05-20 NOTE — Telephone Encounter (Signed)
Patient did not answer and he has spoken with someone at Dr. Gwenyth Ober office.  Anayely Constantine,cma

## 2022-05-20 NOTE — Telephone Encounter (Signed)
Pt is calling back. Requesting call back.

## 2022-05-20 NOTE — Telephone Encounter (Signed)
Left voicemail message to call back for review of recommendations.     [name], MD  Sent: Mon May 19, 2022  6:31 PM  To: [facility] Triage     Message  Appears he is in the emergency room October 30  If he is having diarrhea and on diuretic, likely bordering on dehydration  Would hold the diuretic for now until diarrhea improves and blood pressure stabilizes  No other blood pressure medications to hold  Blood pressure can also drop if he is losing weight  Thx  TGollan

## 2022-05-21 NOTE — Progress Notes (Signed)
Remote pacemaker transmission.   

## 2022-05-28 NOTE — Progress Notes (Signed)
Cardiology Office Note:    Date:  05/30/2022   ID:  [name], DOB 1945-08-29, MRN [id number]  PCP:  Leone Haven, MD   [facility] Cardiologist:  Ida Rogue, MD     Referring MD: Leone Haven, MD   Chief Complaint  Patient presents with   Follow-up    6 month follow up visit. Patient states that he is doing ok today.  Meds reviewed with patient.     History of Present Illness:    [name] is a 76 y.o. male with a hx of the following:  PAF HTN Chronic diastolic CHF (EF 53-29%) CAD, s/p CABG PAD SSS, s/p PPM CM Hx of CVA (2023) COPD OSA on CPAP Hx of CLL PTSD Multilevel spinal stenosis  Hx of CABG x 3 in 2010, underwent cardiac cath in 2011 with occluded vein graft x2, patent LIMA, collaterals from left to right.  History of A-fib, converted to NSR with metoprolol, possible conversion pause.  Develop SSS, symptomatic bradycardia with pacemaker placed in 2017.  History of CLL, followed by oncology.  Myoview in June 2022 was abnormal and was therefore set up for right and left heart cath 01/2021 that showed severe two-vessel CAD, including chronic total occlusion of proximal LAD and 90 to 99% distal RCA/RPLA which fills via left to right collaterals, widely patent LIMA to LAD, chronically occluded SVG to OM1 and SVG to RPDA, mild to moderate LVEF 40 to 45%, mildly elevated pulmonary artery pressures, moderately elevated right heart pressures.  Medical therapy was recommended with Lasix and Imdur.  Echo 02/2021 revealed EF 55-60%, grade 1 DD, mildly reduced RV function, trivial MR, mild to moderate aortic valve calcification. Contracted COVID in early 2023, was hospitalized and recovered from this.  Presented to the hospital March 2023 for left-sided weakness, EKG unremarkable.  CT scan was negative for stroke, unable to perform MRI due to pacemaker.  CT of head and neck did not reveal evidence of LVO.  Neurology was consulted and it was  felt that patient had likely suffered ischemic stroke. Eliquis was started 5 days after initial presentation.   Last seen by Dr. Rockey Situ in May 2023.  EKG revealed sinus rhythm, 89 bpm, nonspecific ST abnormality.  Pacer downloads reviewed, no significant arrhythmias identified.  Blood pressure was low in office (110/60), found to be dehydrated and did not eat prior to the visit.  Was not on metoprolol and was on isosorbide. Was recommended he notify office if BP was still low. At previous OV in 05/2021, pt was noted to have positive orthostatics in office, was having diarrhea spells that he attributed to cancer medication, isosorbide was held at that time.  History of falls.  Today he presents for follow-up. He states he is doing very well; however, he presented to ED via EMS on 05/19/22. He had checked his BP that day and it was around 88/41, called nurse triage, who recommended he call EMS. On arrival to the ED, his BP was found to be 119/67. Also was reporting weakness and diarrhea.  2 view chest x-ray unremarkable.  Kidney function and electrolytes normal.  CBC revealed WBC 13, RBC 3.13, hemoglobin 10.3, hematocrit 31.3.  EKG did not reveal anything acute.  Patient left AMA.  He states blood pressure has been good and he is feeling good.  Denies any recent chest pain, shortness of breath, dizziness, weakness, syncope, presyncope, orthopnea, PND, bleeding, swelling, or claudication.  Denies any other questions  or concerns today.  Past Medical History:  Diagnosis Date   Anxiety    Arthritis    Atrial fibrillation ([place])    a. Dx 2013, recurred 02/2014, CHA2DS2VASc = 3 -->placed on Eliquis;  b. 02/2014 Echo: EF 50-55%, mid and apical anterior septum and mid and apical inf septum are abnl, mild to mod Ao sclerosis w/o AS.   Cancer associated pain    Chicken pox    Chronic HFimpEF (heart failure with improved ejection fraction) ([place])    a. 01/2021 LV Gram: EF 35-45%; b. 02/2021 Echo: EF 55-60%, no rwma, GrI  DD, mildly reduced RV fxn, triv MR, mild-mod AoV sclerosis w/o stenosis.   Chronic lymphocytic leukemia ([place])    a. Dx 02/2014.   Complication of anesthesia    History of  PTSD--do not touch patient when waking up from surgery.   COPD (chronic obstructive pulmonary disease) (H[facility])    Coronary artery disease    a. 04/2009 CABG x 3 (LIMA->LAD, VG->OM1, VG->PDA);  b. 09/2009 Cath: occluded VG x 2 w/ patent LIMA and L->R collats. EF 55%, mild antlat HK;  c. 01/2021 Cath: LM mild diff dzs, LAD 100p, LCX nl, RCA 50p, 95d, 99 side branch, RPAV fills via LCX. VG->RPDA 100, VG->OM1 100, LIMA->LAD nl. EF 35-45%.   GERD (gastroesophageal reflux disease)    occasional   History of chemotherapy 2015-2016   HOH (hard of hearing)    Bilateral Hearing Aids   Hyperlipidemia LDL goal <70    Hypertension    Ischemic cardiomyopathy    a. 01/2021 LV Gram: EF 35-45%; b. 02/2021 Echo: EF 55-60%.   Myocardial infarction ([place]) 2010   OSA on CPAP    USE C-PAP   PTSD (post-traumatic stress disorder)    Rheumatic fever 1959   SSS (sick sinus syndrome) ([place])    a. 02/2016 s/p MDT MRI compatible DC PPM (ser# [id number]).   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   Stroke [facility])    TIA (transient ischemic attack) 11/02/2015    Past Surgical History:  Procedure Laterality Date   ABDOMINAL HERNIA REPAIR     APPENDECTOMY  06/21/1985   CARDIAC CATHETERIZATION  2010; 2011   ; Dr Fletcher Anon   CORONARY ARTERY BYPASS GRAFT  04/2009   "CABG X3"   EP IMPLANTABLE DEVICE N/A 03/03/2016   Procedure: Pacemaker Implant;  Surgeon: Deboraha Sprang, MD;  Location: [facility];  Service: Cardiovascular;  Laterality: N/A;   FOREIGN BODY REMOVAL  1968   "shrapnel in my tailbone"   INGUINAL HERNIA REPAIR Right    INSERT / REPLACE / [place] Right 2018   LAPAROSCOPIC CHOLECYSTECTOMY     RIGHT/LEFT HEART CATH AND CORONARY/GRAFT ANGIOGRAPHY N/A 02/04/2021   Procedure: RIGHT/LEFT HEART CATH  AND CORONARY/GRAFT ANGIOGRAPHY;  Surgeon: Nelva Bush, MD;  Location: [facility];  Service: Cardiovascular;  Laterality: N/A;   TONSILLECTOMY AND ADENOIDECTOMY  1956   TOTAL HIP ARTHROPLASTY Right 10/22/2016   Procedure: TOTAL HIP ARTHROPLASTY;  Surgeon: Dereck Leep, MD;  Location: [facility];  Service: Orthopedics;  Laterality: Right;   TOTAL HIP ARTHROPLASTY Left 11/04/2017   Procedure: TOTAL HIP ARTHROPLASTY;  Surgeon: Dereck Leep, MD;  Location: [facility];  Service: Orthopedics;  Laterality: Left;    Current Medications: Current Meds  Medication Sig   acalabrutinib maleate (CALQUENCE) 100 MG tablet Take 1 tablet (100 mg total) by mouth 2 (two) times daily.  acetaminophen (TYLENOL) 325 MG tablet Take 1-2 tablets (325-650 mg total) by mouth every 4 (four) hours as needed for mild pain.   acyclovir (ZOVIRAX) 400 MG tablet TAKE 1 TABLET(400 MG) BY MOUTH TWICE DAILY   albuterol (VENTOLIN HFA) 108 (90 Base) MCG/ACT inhaler Inhale 2 puffs into the lungs every 6 (six) hours as needed for wheezing or shortness of breath.   amiodarone (PACERONE) 200 MG tablet Take 1/2 tablet (100 mg) by mouth twice daily 5 days a week   apixaban (ELIQUIS) 5 MG TABS tablet Take 5 mg by mouth 2 (two) times daily.   azelastine (ASTELIN) 0.1 % nasal spray Place 2 sprays into both nostrils 2 (two) times daily. Use in each nostril as directed   budesonide-formoterol (SYMBICORT) 160-4.5 MCG/ACT inhaler Inhale 2 puffs into the lungs 2 (two) times daily.   cetirizine (ZYRTEC) 10 MG tablet Take 10 mg by mouth daily as needed for allergies.    Coenzyme Q10 (COQ10) 200 MG CAPS Take 200 mg by mouth daily.   ezetimibe (ZETIA) 10 MG tablet Take 1 tablet (10 mg total) by mouth daily.   Krill Oil 350 MG CAPS Take 350 mg by mouth daily as needed (Heart).   mirtazapine (REMERON) 15 MG tablet Take 15 mg by mouth at bedtime as needed (for panic associated with PTSD).    Multiple Vitamin (MULTIVITAMIN WITH MINERALS) TABS  tablet Take 1 tablet by mouth daily.   nitroGLYCERIN (NITROSTAT) 0.4 MG SL tablet Place 1 tablet (0.4 mg total) under the tongue every 5 (five) minutes as needed for chest pain. (Patient taking differently: Place 0.4 mg under the tongue every 5 (five) minutes as needed for chest pain. Has not needed)   PRALUENT 150 MG/ML SOAJ INJECT 150 MG UNDER THE SKIN EVERY 14 DAYS   Tiotropium Bromide Monohydrate 2.5 MCG/ACT AERS Inhale 2 puffs into the lungs at bedtime. Spiriva   traMADol (ULTRAM) 50 MG tablet Take 1 tablet (50 mg total) by mouth every 12 (twelve) hours as needed.    furosemide (LASIX) 40 MG tablet Take 1 tablet (40 mg total) by mouth daily.     Allergies:   Augmentin [amoxicillin-pot clavulanate]   Social History   Socioeconomic History   Marital status: Married    Spouse name: Not on file   Number of children: Not on file   Years of education: Not on file   Highest education level: Not on file  Occupational History   Occupation: retired    Fish farm manager: [employer]    Comment: Education officer, [employer]  Tobacco Use   Smoking status: Former    Packs/day: 1.00    Years: 40.00    Total pack years: 40.00    Types: Cigarettes    Quit date: 07/21/2006    Years since quitting: 15.8   Smokeless tobacco: Never  Vaping Use   Vaping Use: Former  Substance and Sexual Activity   Alcohol use: Not Currently   Drug use: No   Sexual activity: Not Currently  Other Topics Concern   Not on file  Social History Narrative   Lives in [place] with wife. Dogs. Goats. Children - 4. Grandchildren - 7.      Worked - Sports coach, part time.   Diet - healthy   Exercise - very active      Service - [employer] - [place]   Social Determinants of Health   Financial Resource Strain: Low Risk  (01/30/2022)   Overall Financial Resource Strain (CARDIA)  Difficulty of Paying Living Expenses: Not hard at all  Food Insecurity: No Food Insecurity (01/30/2022)   Hunger Vital Sign    Worried  About Running Out of Food in the Last Year: Never true    Ran Out of Food in the Last Year: Never true  Transportation Needs: No Transportation Needs (01/30/2022)   PRAPARE - Hydrologist (Medical): No    Lack of Transportation (Non-Medical): No  Physical Activity: Sufficiently Active (08/16/2021)   Exercise Vital Sign    Days of Exercise per Week: 7 days    Minutes of Exercise per Session: 30 min  Stress: No Stress Concern Present (08/16/2021)   [place]    Feeling of Stress : Not at all  Social Connections: Unknown (08/16/2021)   Social Connection and Isolation Panel [NHANES]    Frequency of Communication with Friends and Family: More than three times a week    Frequency of Social Gatherings with Friends and Family: Not on file    Attends Religious Services: Not on file    Active Member of Clubs or Organizations: Not on file    Attends Archivist Meetings: Not on file    Marital Status: Married     Family History: The patient's family history includes Coronary artery disease in an other family member; Heart attack in his mother; Heart disease in his mother.  ROS:   Review of Systems  Constitutional: Negative.   HENT: Negative.    Eyes: Negative.   Respiratory: Negative.    Cardiovascular: Negative.   Gastrointestinal: Negative.   Genitourinary: Negative.   Musculoskeletal: Negative.   Skin: Negative.   Neurological: Negative.   Endo/Heme/Allergies: Negative.   Psychiatric/Behavioral: Negative.      Please see the history of present illness.    All other systems reviewed and are negative.  EKGs/Labs/Other Studies Reviewed:    The following studies were reviewed today:   EKG:  EKG is ordered today.  The ekg ordered today demonstrates A-paced rhythm, 68 bpm, with prolonged AV conduction, nonspecific ST and T wave abnormality, prolonged QT, no acute ischemic changes.    2D Echo on 02/19/2021: 1. Left ventricular ejection fraction, by estimation, is 55 to 60%. The  left ventricle has normal function. The left ventricle has no regional  wall motion abnormalities. Left ventricular diastolic parameters are  consistent with Grade I diastolic  dysfunction (impaired relaxation).   2. Right ventricular systolic function is mildly reduced. The right  ventricular size is normal. Mildly increased right ventricular wall  thickness. Tricuspid regurgitation signal is inadequate for assessing PA  pressure.   3. The mitral valve was not well visualized. Trivial mitral valve  regurgitation. No evidence of mitral stenosis.   4. The aortic valve has an indeterminant number of cusps. There is mild  calcification of the aortic valve. There is moderate thickening of the  aortic valve. Aortic valve regurgitation is not visualized. Mild to  moderate aortic valve  sclerosis/calcification is present, without any evidence of aortic  stenosis.   5. The inferior vena cava is dilated in size with >50% respiratory  variability, suggesting right atrial pressure of 8 mmHg.   Right and left heart cath on 02/04/2021: Severe two-vessel coronary artery disease, including chronic total occlusion of the proximal LAD and 90-99% distal RCA/rPLA disease. RPL branches and rPDA fill via left-to-right collaterals. Widely patent LIMA-LAD. Chronically occluded SVG-OM1 and SVG-rPDA. Mildly to moderately reduced left  ventricular systolic function (LVEDP 35-70%). Mildly elevated left heart and pulmonary artery pressures. Moderately elevated right heart filling pressures. Normal Fick cardiac output/index.   Recommendations: Optimize medical therapy.  I will add standing furosemide 40 mg PO daily and isosorbide mononitrate 30 mg daily. Continue secondary prevention of coronary artery disease. Consider repeat CBC in 1-2 weeks, given iSTAT hemoglobin 9-10 and recent fall with significant hip/thigh  bruising on chronic anticoagulation. If no evidence of bleeding/vascular injury, restart apixaban tomorrow morning.   Myoview on 12/25/2020: Downsloping ST segment depression ST segment depression was noted during stress in the V2, V3, V1 and V4 leads. ST depression on baseline EKG worsened with Lexiscan. Defect 1: There is a medium defect of moderate severity present in the mid anterior, mid anteroseptal, apical anterior and apex location. Findings consistent with anterior/anteroseptal ischemia. This is an intermediate risk study. The left ventricular ejection fraction is mildly decreased (45-54%). CT attenuation images shows significant aortic and coronary calcifications as well as RV pacemaker lead.  Recent Labs: 07/02/2021: TSH 1.900 09/13/2021: B Natriuretic Peptide 47.0 05/02/2022: ALT 14 05/19/2022: BUN 23; Creatinine, Ser 1.10; Hemoglobin 10.3; Magnesium 2.3; Platelets 155; Potassium 3.6; Sodium 137  Recent Lipid Panel    Component Value Date/Time   CHOL 79 10/19/2021 0532   CHOL 113 05/18/2017 1048   CHOL 125 03/08/2014 0451   TRIG 77 10/19/2021 0532   TRIG 119 03/08/2014 0451   TRIG 95 08/01/2009 0000   HDL 37 (L) 10/19/2021 0532   HDL 47 05/18/2017 1048   HDL 31 (L) 03/08/2014 0451   CHOLHDL 2.1 10/19/2021 0532   VLDL 15 10/19/2021 0532   VLDL 24 03/08/2014 0451   LDLCALC 27 10/19/2021 0532   LDLCALC 53 05/18/2017 1048   LDLCALC 70 03/08/2014 0451     Risk Assessment/Calculations:    CHA2DS2-VASc Score = 6  This indicates a 9.7% annual risk of stroke. The patient's score is based upon: CHF History: 0 HTN History: 1 Diabetes History: 0 Stroke History: 2 Vascular Disease History: 1 Age Score: 2 Gender Score: 0  Physical Exam:    VS:  BP 101/60 (BP Location: Left Arm, Patient Position: Sitting, Cuff Size: Normal)   Pulse 68   Ht '5\' 8"'$  (1.727 m)   Wt 195 lb 9.6 oz (88.7 kg)   SpO2 98%   BMI 29.74 kg/m     Wt Readings from Last 3 Encounters:  05/29/22  195 lb 9.6 oz (88.7 kg)  05/19/22 156 lb (70.8 kg)  05/02/22 202 lb 3.2 oz (91.7 kg)     GEN: Well nourished, well developed in no acute distress HEENT: Normal NECK: No JVD; No carotid bruits LYMPHATICS: No lymphadenopathy CARDIAC: S1/S2. RRR, no murmurs, rubs, gallops; 2+ peripheral pulses throughout, strong and equal bilaterally RESPIRATORY:  Clear and diminished to auscultation without rales, wheezing or rhonchi  MUSCULOSKELETAL:  No edema; No deformity  SKIN: Thin skin, warm and dry NEUROLOGIC:  Alert and oriented x 3 PSYCHIATRIC:  Normal affect   ASSESSMENT AND PLAN:    Chronic diastolic CHF EF 17-79% 09/9028. Euvolemic and well compensated on exam.  Weight is down 7 pounds from 1 month ago.  Blood pressure is low normal.  His previous other blood pressure medications were stopped due to past low readings.  We will decrease Lasix to 20 mg daily as patient is euvolemic and well compensated on exam and d/t recent low normal BP readings. Low sodium diet, fluid restriction <2L, and daily weights encouraged. Educated to contact  our office for weight gain of 2 lbs overnight or 5 lbs in one week. Heart healthy diet and regular cardiovascular exercise encouraged.   2. CAD, s/p CABG x3 in 2010 Right and left heart cath in 2022 revealed severe two-vessel CAD with chronic total occlusion of proximal LAD and distal RCA/R PLA.  The RPL branches and RPDA fills via left-to-right collaterals.  Mild to moderately reduced LV SF and mildly elevated left heart and pulmonary artery pressures, optimizing medical therapy is recommended secondary prevention of CAD.  Not on aspirin due to being on Eliquis.  Continue Zetia, Praluent, and nitroglycerin as needed. Heart healthy diet and regular cardiovascular exercise encouraged.   3.  Hypercholesterolemia Lipid profile in April 2023 revealed total cholesterol 79, HDL 37, LDL 27, triglycerides 77, and VLDL 15.  Has history of statin intolerance.  Continue Zetia and  Praluent.  4. PAF Currently not on any beta-blocker due to recent low blood pressures.  Continue to hold metoprolol for now with low threshold to restart for any tacky arrhythmias.  Maintaining A- paced rhythm as seen on today's EKG.  CHA2DS2-VASc score 6.  Continue amiodarone as well as Eliquis.  He is currently tolerating medication well.  He denies any bleeding issues.  He is on appropriate dosing of Eliquis.  5.  Hypertension Recent low blood pressure readings that prompted him for recent ED visit.  Initial BP on arrival today 102/58, repeat BP 101/60.  Has lost 7 pounds within the past month.  Plan to reduce Lasix to 20 mg daily.  All other blood pressure medications have been removed from his med list due to recent low blood pressure readings. Discussed to monitor BP at home at least 2 hours after medications and sitting for 5-10 minutes. Heart healthy diet and regular cardiovascular exercise encouraged.   6. SSS, s/p PPM Denies any tachycardia or palpitations.  Remote device check last month revealed battery status was good, lead measurements were unchanged, and histograms were appropriate.  Continue to follow-up with EP.   7.  History of ischemic stroke History of most likely ischemic stroke in March 2023.  Has recovered well since this occurred.  Continue Eliquis. Heart healthy diet and regular cardiovascular exercise encouraged. Continue to follow with PCP.  8.  COPD Denies any recent exacerbations.  Continue current medication regimen. Continue to follow with PCP.   9.  OSA on CPAP Continued compliance encouraged. Continue to follow with PCP.  10.  Disposition: Follow-up with Dr. Rockey Situ in 4 months or sooner if anything changes   Medication Adjustments/Labs and Tests Ordered: Current medicines are reviewed at length with the patient today.  Concerns regarding medicines are outlined above.  No orders of the defined types were placed in this encounter.  Meds ordered this encounter   Medications   furosemide (LASIX) 20 MG tablet    Sig: Take 1 tablet (20 mg total) by mouth daily.    Dispense:  90 tablet    Refill:  1    Patient Instructions  Medication Instructions:   START TAKING : FUROSEMIDE 20 MG ONCE A DAY   *If you need a refill on your cardiac medications before your next appointment, please call your pharmacy*   Lab Work: NONE ORDERED  TODAY     If you have labs (blood work) drawn today and your tests are completely normal, you will receive your results only by: [place] (if you have MyChart) OR A paper copy in the mail If you have any  lab test that is abnormal or we need to change your treatment, we will call you to review the results.   Testing/Procedures: NONE ORDERED  TODAY     Follow-Up: At [facility], you and your health needs are our priority.  As part of our continuing mission to provide you with exceptional heart care, we have created designated Provider Care Teams.  These Care Teams include your primary Cardiologist (physician) and Advanced Practice Providers (APPs -  Physician Assistants and Nurse Practitioners) who all work together to provide you with the care you need, when you need it.  We recommend signing up for the patient portal called "MyChart".  Sign up information is provided on this After Visit Summary.  MyChart is used to connect with patients for Virtual Visits (Telemedicine).  Patients are able to view lab/test results, encounter notes, upcoming appointments, etc.  Non-urgent messages can be sent to your provider as well.   To learn more about what you can do with MyChart, go to [email or web address].    Your next appointment:    4 MONTHS ( PUSH OUT 06-16-22) APPOINTMENT   The format for your next appointment:   In Person  Provider:   Ida Rogue, MD    Other Instructions    Important Information About Sugar         Signed, Finis Bud, NP  05/30/2022 5:08 PM    [place]

## 2022-05-29 ENCOUNTER — Encounter: Payer: Self-pay | Admitting: Nurse Practitioner

## 2022-05-29 ENCOUNTER — Ambulatory Visit: Payer: Medicare HMO | Attending: Nurse Practitioner | Admitting: Nurse Practitioner

## 2022-05-29 VITALS — BP 101/60 | HR 68 | Ht 68.0 in | Wt 195.6 lb

## 2022-05-29 DIAGNOSIS — Z8673 Personal history of transient ischemic attack (TIA), and cerebral infarction without residual deficits: Secondary | ICD-10-CM

## 2022-05-29 DIAGNOSIS — I5032 Chronic diastolic (congestive) heart failure: Secondary | ICD-10-CM | POA: Diagnosis not present

## 2022-05-29 DIAGNOSIS — I2510 Atherosclerotic heart disease of native coronary artery without angina pectoris: Secondary | ICD-10-CM

## 2022-05-29 DIAGNOSIS — I495 Sick sinus syndrome: Secondary | ICD-10-CM

## 2022-05-29 DIAGNOSIS — Z950 Presence of cardiac pacemaker: Secondary | ICD-10-CM

## 2022-05-29 DIAGNOSIS — E7800 Pure hypercholesterolemia, unspecified: Secondary | ICD-10-CM

## 2022-05-29 DIAGNOSIS — I480 Paroxysmal atrial fibrillation: Secondary | ICD-10-CM

## 2022-05-29 DIAGNOSIS — I10 Essential (primary) hypertension: Secondary | ICD-10-CM

## 2022-05-29 DIAGNOSIS — G4733 Obstructive sleep apnea (adult) (pediatric): Secondary | ICD-10-CM

## 2022-05-29 DIAGNOSIS — J449 Chronic obstructive pulmonary disease, unspecified: Secondary | ICD-10-CM

## 2022-05-29 MED ORDER — FUROSEMIDE 20 MG PO TABS: 20.0000 mg | ORAL_TABLET | Freq: Every day | ORAL | 1 refills | Status: DC

## 2022-05-29 NOTE — Patient Instructions (Addendum)
Medication Instructions:   START TAKING : FUROSEMIDE 20 MG ONCE A DAY   *If you need a refill on your cardiac medications before your next appointment, please call your pharmacy*   Lab Work: NONE ORDERED  TODAY     If you have labs (blood work) drawn today and your tests are completely normal, you will receive your results only by: [place] (if you have MyChart) OR A paper copy in the mail If you have any lab test that is abnormal or we need to change your treatment, we will call you to review the results.   Testing/Procedures: NONE ORDERED  TODAY     Follow-Up: At [facility], you and your health needs are our priority.  As part of our continuing mission to provide you with exceptional heart care, we have created designated Provider Care Teams.  These Care Teams include your primary Cardiologist (physician) and Advanced Practice Providers (APPs -  Physician Assistants and Nurse Practitioners) who all work together to provide you with the care you need, when you need it.  We recommend signing up for the patient portal called "MyChart".  Sign up information is provided on this After Visit Summary.  MyChart is used to connect with patients for Virtual Visits (Telemedicine).  Patients are able to view lab/test results, encounter notes, upcoming appointments, etc.  Non-urgent messages can be sent to your provider as well.   To learn more about what you can do with MyChart, go to [email or web address].    Your next appointment:    4 MONTHS ( PUSH OUT 06-16-22) APPOINTMENT   The format for your next appointment:   In Person  Provider:   Ida Rogue, MD    Other Instructions    Important Information About Sugar

## 2022-05-30 ENCOUNTER — Encounter: Payer: Self-pay | Admitting: Nurse Practitioner

## 2022-06-02 ENCOUNTER — Ambulatory Visit: Payer: No Typology Code available for payment source | Admitting: Internal Medicine

## 2022-06-02 ENCOUNTER — Encounter: Payer: Self-pay | Admitting: Internal Medicine

## 2022-06-02 ENCOUNTER — Other Ambulatory Visit: Payer: No Typology Code available for payment source

## 2022-06-02 ENCOUNTER — Inpatient Hospital Stay: Payer: Medicare HMO | Attending: Internal Medicine

## 2022-06-02 ENCOUNTER — Inpatient Hospital Stay (HOSPITAL_BASED_OUTPATIENT_CLINIC_OR_DEPARTMENT_OTHER): Payer: Medicare HMO | Admitting: Internal Medicine

## 2022-06-02 VITALS — BP 127/62 | HR 63 | Temp 98.2°F | Wt 197.0 lb

## 2022-06-02 DIAGNOSIS — I4891 Unspecified atrial fibrillation: Secondary | ICD-10-CM | POA: Diagnosis not present

## 2022-06-02 DIAGNOSIS — M2550 Pain in unspecified joint: Secondary | ICD-10-CM | POA: Insufficient documentation

## 2022-06-02 DIAGNOSIS — D649 Anemia, unspecified: Secondary | ICD-10-CM | POA: Insufficient documentation

## 2022-06-02 DIAGNOSIS — Z7901 Long term (current) use of anticoagulants: Secondary | ICD-10-CM | POA: Insufficient documentation

## 2022-06-02 DIAGNOSIS — Z79899 Other long term (current) drug therapy: Secondary | ICD-10-CM | POA: Diagnosis not present

## 2022-06-02 DIAGNOSIS — Z8673 Personal history of transient ischemic attack (TIA), and cerebral infarction without residual deficits: Secondary | ICD-10-CM | POA: Insufficient documentation

## 2022-06-02 DIAGNOSIS — C9110 Chronic lymphocytic leukemia of B-cell type not having achieved remission: Secondary | ICD-10-CM

## 2022-06-02 LAB — COMPREHENSIVE METABOLIC PANEL
ALT: 13 U/L (ref 0–44)
AST: 15 U/L (ref 15–41)
Albumin: 3.9 g/dL (ref 3.5–5.0)
Alkaline Phosphatase: 85 U/L (ref 38–126)
Anion gap: 7 (ref 5–15)
BUN: 17 mg/dL (ref 8–23)
CO2: 27 mmol/L (ref 22–32)
Calcium: 9.2 mg/dL (ref 8.9–10.3)
Chloride: 102 mmol/L (ref 98–111)
Creatinine, Ser: 0.85 mg/dL (ref 0.61–1.24)
GFR, Estimated: >60 mL/min (ref >60)
Glucose, Bld: 101 mg/dL — ABNORMAL HIGH (ref 70–99)
Potassium: 4.3 mmol/L (ref 3.5–5.1)
Sodium: 136 mmol/L (ref 135–145)
Total Bilirubin: 0.6 mg/dL (ref 0.3–1.2)
Total Protein: 7.7 g/dL (ref 6.5–8.1)

## 2022-06-02 LAB — LACTATE DEHYDROGENASE: LDH: 112 U/L (ref 98–192)

## 2022-06-02 LAB — CBC WITH DIFFERENTIAL/PLATELET
Abs Immature Granulocytes: 0.08 10*3/uL — ABNORMAL HIGH (ref 0.00–0.07)
Basophils Absolute: 0.1 10*3/uL (ref 0.0–0.1)
Basophils Relative: 0 %
Eosinophils Absolute: 0.1 10*3/uL (ref 0.0–0.5)
Eosinophils Relative: 1 %
HCT: 34.1 % — ABNORMAL LOW (ref 39.0–52.0)
Hemoglobin: 11 g/dL — ABNORMAL LOW (ref 13.0–17.0)
Immature Granulocytes: 1 %
Lymphocytes Relative: 58 %
Lymphs Abs: 6.6 10*3/uL — ABNORMAL HIGH (ref 0.7–4.0)
MCH: 33.4 pg (ref 26.0–34.0)
MCHC: 32.3 g/dL (ref 30.0–36.0)
MCV: 103.6 fL — ABNORMAL HIGH (ref 80.0–100.0)
Monocytes Absolute: 0.5 10*3/uL (ref 0.1–1.0)
Monocytes Relative: 4 %
Neutro Abs: 4.1 10*3/uL (ref 1.7–7.7)
Neutrophils Relative %: 36 %
Platelets: 184 10*3/uL (ref 150–400)
RBC: 3.29 MIL/uL — ABNORMAL LOW (ref 4.22–5.81)
RDW: 14.9 % (ref 11.5–15.5)
WBC: 11.4 10*3/uL — ABNORMAL HIGH (ref 4.0–10.5)
nRBC: 0 % (ref 0.0–0.2)

## 2022-06-02 LAB — IRON AND TIBC
Iron: 60 ug/dL (ref 45–182)
Saturation Ratios: 17 % — ABNORMAL LOW (ref 17.9–39.5)
TIBC: 350 ug/dL (ref 250–450)
UIBC: 290 ug/dL

## 2022-06-02 LAB — RETICULOCYTES
Immature Retic Fract: 23.8 % — ABNORMAL HIGH (ref 2.3–15.9)
RBC.: 3.31 MIL/uL — ABNORMAL LOW (ref 4.22–5.81)
Retic Count, Absolute: 82.1 10*3/uL (ref 19.0–186.0)
Retic Ct Pct: 2.5 % (ref 0.4–3.1)

## 2022-06-02 LAB — FERRITIN: Ferritin: 71 ng/mL (ref 24–336)

## 2022-06-02 LAB — FOLATE: Folate: 12.2 ng/mL (ref >5.9)

## 2022-06-02 LAB — VITAMIN B12: Vitamin B-12: 414 pg/mL (ref 180–914)

## 2022-06-02 NOTE — Progress Notes (Signed)
Follow up CLL. Fatigue is his main issues.Dyspnea with exertion.

## 2022-06-02 NOTE — Progress Notes (Signed)
[place] OFFICE PROGRESS NOTE  Patient Care Team: Leone Haven, MD as PCP - General (Family Medicine) Rockey Situ Kathlene November, MD as PCP - Cardiology (Cardiology) [name], MD as Consulting Physician (Cardiology) Cammie Sickle, MD as Medical Oncologist (Hematology and Oncology) Gloris Ham, RN as Registered Nurse (Oncology)   Cancer Staging  No matching staging information was found for the patient.   Oncology History Overview Note  # AUG 2015- SLL/CLL [Right Ax Ln Bx] s/p Benda-Rituxan x6 [finished March 2016]; Maintenance Rituxan q 73m[started April 2016; Dr.Pandit];Last Ritux Jan 2017.  MARCH 2017- CT N/C/A/P- NED. STOP Ritux; surveillance   # AUG 2019- CT/PET- progression/NO transformation;  # NOV 2019- Progression; started Ibrutinib 420 mg/d. STOPPED in end of feb sec to extreme fatigue/joint pains/cramps  #November 01, 2018-start ibrutinib 280 mg a day; December 20, 2018-discontinue ibrutinib secondary multiple side effects.   #January 03, 2019 start Gazyva + Ven [July]; finished Dec 2020-; started VLawson Fiscalmaintenance [200 mg a day; DI-amiodarone]; SEP 2022- congestive heart failure; s/p cardiac cath-noted to have CAD however no stents placed.  2D echo- EF-55%  #Mid March 2021-venetoclax dose reduced to 100 mg [second diarrhea]. [place]-multiple admissions to the hospital/pneumonia infection stroke x 3 m.   # MAY 2023-progression of disease in the neck ches; Nov 18, 2021-RE-started venetoclax;   # JUNE 29th, 2023-progression of disease in the neck- STOP VENAOCLAX;   # AUG 16th, 2023 START ACALABRUTINIB 100 mg BID [previous intolerance to ibrutinib; delayed sec to C.diff]    #? 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    Ambulating with a cane.   Michael [id number].o.  male CLL-high risk of recurrent/relapsed currently on on alcabrutinib is here for a follow up.   Patient reports feeling well.  Denies any night sweats, fever, chills or new lumps.  presented to ED on 10/30 for diarrhea.  Work-up was unremarkable.  Has improved now.   \Review of Systems  Constitutional:  Positive for malaise/fatigue. Negative for chills, diaphoresis and fever.  HENT:  Negative for nosebleeds and sore throat.   Eyes:  Negative for double vision.  Respiratory:  Negative for hemoptysis, shortness of breath and wheezing.   Cardiovascular:  Negative for chest pain, palpitations, orthopnea and leg swelling.  Gastrointestinal:  Positive for diarrhea. Negative for abdominal pain, blood in stool, constipation, heartburn, melena, nausea and vomiting.  Genitourinary:  Negative for dysuria, frequency and urgency.  Musculoskeletal:  Positive for back pain, joint pain and myalgias.  Skin: Negative.  Negative for itching and rash.  Neurological:  Negative for dizziness, tingling, focal weakness and headaches.  Psychiatric/Behavioral:  Negative for depression. The patient is not nervous/anxious and does not have insomnia.       PAST MEDICAL HISTORY :  Past Medical History:  Diagnosis  Date   Anxiety    Arthritis    Atrial fibrillation ([place])    a. Dx 2013, recurred 02/2014, CHA2DS2VASc = 3 -->placed on Eliquis;  b. 02/2014 Echo: EF 50-55%, mid and apical anterior septum and mid and  apical inf septum are abnl, mild to mod Ao sclerosis w/o AS.   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 01/2021 LV Gram: EF 35-45%; b. 02/2021 Echo: EF 55-60%, no rwma, GrI DD, mildly reduced RV fxn, triv MR, mild-mod AoV sclerosis w/o stenosis.   Chronic lymphocytic leukemia ([place])    a. Dx 02/2014.   Complication of anesthesia    History of  PTSD--do not touch patient when waking up from surgery.   COPD (chronic obstructive pulmonary disease) (HCC)    Coronary artery disease    a. 04/2009 CABG x 3 (LIMA->LAD, VG->OM1, VG->PDA);  b. 09/2009 Cath: occluded VG x 2 w/ patent LIMA and L->R collats. EF 55%, mild antlat HK;  c. 01/2021 Cath: LM mild diff dzs, LAD 100p, LCX nl, RCA 50p, 95d, 99 side branch, RPAV fills via LCX. VG->RPDA 100, VG->OM1 100, LIMA->LAD nl. EF 35-45%.   GERD (gastroesophageal reflux disease)    occasional   History of chemotherapy 2015-2016   HOH (hard of hearing)    Bilateral Hearing Aids   Hyperlipidemia LDL goal <70    Hypertension    Ischemic cardiomyopathy    a. 01/2021 LV Gram: EF 35-45%; b. 02/2021 Echo: EF 55-60%.   Myocardial infarction ([place]) 2010   OSA on CPAP    USE C-PAP   PTSD (post-traumatic stress disorder)    Rheumatic fever 1959   SSS (sick sinus syndrome) ([place])    a. 02/2016 s/p MDT MRI compatible DC PPM (ser# [id number]).   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   Stroke [facility])    TIA (transient ischemic attack) 11/02/2015    PAST SURGICAL HISTORY :   Past Surgical History:  Procedure Laterality Date   ABDOMINAL HERNIA REPAIR     APPENDECTOMY  06/21/1985   CARDIAC CATHETERIZATION  2010; 2011   ; Dr Fletcher Anon   CORONARY ARTERY BYPASS GRAFT  04/2009   "CABG X3"   EP IMPLANTABLE DEVICE N/A 03/03/2016   Procedure: Pacemaker Implant;  Surgeon: Deboraha Sprang, MD;  Location: [facility];  Service: Cardiovascular;  Laterality: N/A;   FOREIGN BODY REMOVAL   1968   "shrapnel in my tailbone"   INGUINAL HERNIA REPAIR Right    INSERT / REPLACE / [place] Right 2018   LAPAROSCOPIC CHOLECYSTECTOMY     RIGHT/LEFT HEART CATH AND CORONARY/GRAFT ANGIOGRAPHY N/A 02/04/2021   Procedure: RIGHT/LEFT HEART CATH AND CORONARY/GRAFT ANGIOGRAPHY;  Surgeon: Nelva Bush, MD;  Location: [facility];  Service: Cardiovascular;  Laterality: N/A;   TONSILLECTOMY AND ADENOIDECTOMY  1956   TOTAL HIP ARTHROPLASTY Right 10/22/2016   Procedure: TOTAL HIP ARTHROPLASTY;  Surgeon: Dereck Leep, MD;  Location: [facility];  Service: Orthopedics;  Laterality: Right;   TOTAL HIP ARTHROPLASTY Left 11/04/2017   Procedure: TOTAL HIP ARTHROPLASTY;  Surgeon: Dereck Leep, MD;  Location: [facility];  Service: Orthopedics;  Laterality: Left;    FAMILY HISTORY :   Family History  Problem Relation Age of Onset   Heart disease Mother    Heart attack Mother    Coronary artery disease Other  family history    SOCIAL HISTORY:   Social History   Tobacco Use   Smoking status: Former    Packs/day: 1.00    Years: 40.00    Total pack years: 40.00    Types: Cigarettes    Quit date: 07/21/2006    Years since quitting: 15.8   Smokeless tobacco: Never  Vaping Use   Vaping Use: Former  Substance Use Topics   Alcohol use: Not Currently   Drug use: No    ALLERGIES:  is allergic to augmentin [amoxicillin-pot clavulanate].  MEDICATIONS:  Current Outpatient Medications  Medication Sig Dispense Refill   acalabrutinib maleate (CALQUENCE) 100 MG tablet Take 1 tablet (100 mg total) by mouth 2 (two) times daily. 60 tablet 2   acetaminophen (TYLENOL) 325 MG tablet Take 1-2 tablets (325-650 mg total) by mouth every 4 (four) hours as needed for mild pain.     acyclovir (ZOVIRAX) 400 MG tablet TAKE 1 TABLET(400 MG) BY MOUTH TWICE DAILY 60 tablet 6   albuterol (VENTOLIN HFA) 108 (90 Base) MCG/ACT inhaler Inhale 2 puffs into the lungs every 6 (six)  hours as needed for wheezing or shortness of breath. 1 each 0   amiodarone (PACERONE) 200 MG tablet Take 1/2 tablet (100 mg) by mouth twice daily 5 days a week 60 tablet 6   apixaban (ELIQUIS) 5 MG TABS tablet Take 5 mg by mouth 2 (two) times daily.     azelastine (ASTELIN) 0.1 % nasal spray Place 2 sprays into both nostrils 2 (two) times daily. Use in each nostril as directed 30 mL 1   budesonide-formoterol (SYMBICORT) 160-4.5 MCG/ACT inhaler Inhale 2 puffs into the lungs 2 (two) times daily. 1 Inhaler 11   cetirizine (ZYRTEC) 10 MG tablet Take 10 mg by mouth daily as needed for allergies.      Coenzyme Q10 (COQ10) 200 MG CAPS Take 200 mg by mouth daily.     ezetimibe (ZETIA) 10 MG tablet Take 1 tablet (10 mg total) by mouth daily. 90 tablet 3   furosemide (LASIX) 20 MG tablet Take 1 tablet (20 mg total) by mouth daily. (Patient taking differently: Take 10 mg by mouth daily.) 90 tablet 1   Krill Oil 350 MG CAPS Take 350 mg by mouth daily as needed (Heart).     mirtazapine (REMERON) 15 MG tablet Take 15 mg by mouth at bedtime as needed (for panic associated with PTSD).      Multiple Vitamin (MULTIVITAMIN WITH MINERALS) TABS tablet Take 1 tablet by mouth daily.     PRALUENT 150 MG/ML SOAJ INJECT 150 MG UNDER THE SKIN EVERY 14 DAYS 2 mL 2   Tiotropium Bromide Monohydrate 2.5 MCG/ACT AERS Inhale 2 puffs into the lungs at bedtime. Spiriva     traMADol (ULTRAM) 50 MG tablet Take 1 tablet (50 mg total) by mouth every 12 (twelve) hours as needed. 60 tablet 0   diphenoxylate-atropine (LOMOTIL) 2.5-0.025 MG tablet Take 1 tablet by mouth 4 (four) times daily as needed for diarrhea or loose stools. (Patient not taking: Reported on 05/29/2022) 45 tablet 0   nitroGLYCERIN (NITROSTAT) 0.4 MG SL tablet Place 1 tablet (0.4 mg total) under the tongue every 5 (five) minutes as needed for chest pain. (Patient not taking: Reported on 06/02/2022) 25 tablet 6   No current facility-administered medications for this visit.     PHYSICAL EXAMINATION: ECOG PERFORMANCE STATUS: 1 - Symptomatic but completely ambulatory  BP 127/62   Pulse 63   Temp 98.2 F (36.8  C) (Tympanic)   Wt 197 lb (89.4 kg)   SpO2 100%   BMI 29.95 kg/m   Filed Weights   06/02/22 1321  Weight: 197 lb (89.4 kg)   Vague swelling noted in the right submandibular area.  Nontender. Physical Exam HENT:     Head: Normocephalic and atraumatic.     Mouth/Throat:     Pharynx: No oropharyngeal exudate.  Eyes:     Pupils: Pupils are equal, round, and reactive to light.  Cardiovascular:     Rate and Rhythm: Normal rate and regular rhythm.  Pulmonary:     Effort: No respiratory distress.     Breath sounds: No wheezing.  Abdominal:     General: Bowel sounds are normal. There is no distension.     Palpations: Abdomen is soft. There is no mass.     Tenderness: There is no abdominal tenderness. There is no guarding or rebound.  Musculoskeletal:        General: No tenderness. Normal range of motion.     Cervical back: Normal range of motion and neck supple.  Skin:    General: Skin is warm.  Neurological:     Mental Status: He is alert and oriented to person, place, and time.  Psychiatric:        Mood and Affect: Affect normal.     LABORATORY DATA:  I have reviewed the data as listed    Component Value Date/Time   NA 136 06/02/2022 1302   NA 143 03/11/2021 1136   NA 139 10/11/2014 1800   K 4.3 06/02/2022 1302   K 3.3 (L) 10/11/2014 1800   CL 102 06/02/2022 1302   CL 106 10/11/2014 1800   CO2 27 06/02/2022 1302   CO2 27 10/11/2014 1800   GLUCOSE 101 (H) 06/02/2022 1302   GLUCOSE 107 (H) 10/11/2014 1800   BUN 17 06/02/2022 1302   BUN 13 03/11/2021 1136   BUN 15 10/11/2014 1800   CREATININE 0.85 06/02/2022 1302   CREATININE 0.89 10/11/2014 1800   CALCIUM 9.2 06/02/2022 1302   CALCIUM 8.8 (L) 10/11/2014 1800   PROT 7.7 06/02/2022 1302   PROT 6.7 05/18/2017 1048   PROT 6.4 (L) 10/11/2014 1800   ALBUMIN 3.9 06/02/2022 1302    ALBUMIN 4.3 05/18/2017 1048   ALBUMIN 4.1 10/11/2014 1800   AST 15 06/02/2022 1302   AST 23 10/11/2014 1800   ALT 13 06/02/2022 1302   ALT 22 10/11/2014 1800   ALKPHOS 85 06/02/2022 1302   ALKPHOS 61 10/11/2014 1800   BILITOT 0.6 06/02/2022 1302   BILITOT 0.7 05/18/2017 1048   BILITOT 0.9 10/11/2014 1800   GFRNONAA >60 06/02/2022 1302   GFRNONAA >60 10/11/2014 1800   GFRAA >60 04/12/2020 0959   GFRAA >60 10/11/2014 1800    No results found for: "SPEP", "UPEP"  Lab Results  Component Value Date   WBC 11.4 (H) 06/02/2022   NEUTROABS 4.1 06/02/2022   HGB 11.0 (L) 06/02/2022   HCT 34.1 (L) 06/02/2022   MCV 103.6 (H) 06/02/2022   PLT 184 06/02/2022      Chemistry      Component Value Date/Time   NA 136 06/02/2022 1302   NA 143 03/11/2021 1136   NA 139 10/11/2014 1800   K 4.3 06/02/2022 1302   K 3.3 (L) 10/11/2014 1800   CL 102 06/02/2022 1302   CL 106 10/11/2014 1800   CO2 27 06/02/2022 1302   CO2 27 10/11/2014 1800   BUN 17 06/02/2022  1302   BUN 13 03/11/2021 1136   BUN 15 10/11/2014 1800   CREATININE 0.85 06/02/2022 1302   CREATININE 0.89 10/11/2014 1800      Component Value Date/Time   CALCIUM 9.2 06/02/2022 1302   CALCIUM 8.8 (L) 10/11/2014 1800   ALKPHOS 85 06/02/2022 1302   ALKPHOS 61 10/11/2014 1800   AST 15 06/02/2022 1302   AST 23 10/11/2014 1800   ALT 13 06/02/2022 1302   ALT 22 10/11/2014 1800   BILITOT 0.6 06/02/2022 1302   BILITOT 0.7 05/18/2017 1048   BILITOT 0.9 10/11/2014 1800       RADIOGRAPHIC STUDIES: I have personally reviewed the radiological images as listed and agreed with the findings in the report. No results found.   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  MAY 27th, 2023- CT neck- Increased adenopathy above the diaphragm with stable prominent/mildly enlarged lymph nodes below the diaphragm and no splenomegaly.   # On Acalabrutnib [AUG 16th, 2023]- SEP  27th, 2023- CT neck improvement of swelling LN- will repeat CT scan CAP in [place].  Tolerating well continue the same.  Patient admits to compliance. Repeat Ct neck/c/a/p in Dec when he will see Dr. Jacinto Reap.   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    # Low immunoglobulins IgG 330/recent severe respiratory infection/CLL-s/pI IVIG #4/4 [last April 2022].NOV 2022- IgG > 500.    # A.fib-on amiodarone [ Dr.Gollan] sinus rhythm-on Eliquis/CHF- decreased lasix to 20/d due to hypotension- s/p Cath [AUG, 2022- Echo-55%] -STABLE.   # MIld Anemia Hb ~10-11-  STABLE; iron panel okay. B12 pending. Folate nl.   # Joint pains/ Muscle pain- continue tramadol/CBD prn; refilled-  -STABLE   # Vaccination: s/p Flu shot.    DISPOSITION: #  follow up in 1 month-  MD; labs- cbc/cmp; Dr. Jacinto Reap        Orders Placed This Encounter  Procedures   CBC with Differential    Standing Status:   Future    Standing Expiration Date:   06/02/2023   Comprehensive metabolic panel    Standing Status:   Future    Standing Expiration Date:   06/02/2023    All questions were answered. The patient knows to call the clinic with any problems, questions or concerns.      Jane Canary, MD 06/02/2022 6:43 PM

## 2022-06-03 NOTE — Addendum Note (Signed)
Addended by: James Ivanoff D on: 06/03/2022 08:55 AM   Modules accepted: Orders

## 2022-06-16 ENCOUNTER — Ambulatory Visit: Payer: Medicare HMO | Admitting: Cardiovascular Disease

## 2022-06-18 ENCOUNTER — Other Ambulatory Visit: Payer: Self-pay | Admitting: *Deleted

## 2022-06-18 MED ORDER — PRALUENT 150 MG/ML ~~LOC~~ SOAJ: SUBCUTANEOUS | 2 refills | Status: DC

## 2022-06-30 ENCOUNTER — Telehealth: Payer: Self-pay | Admitting: *Deleted

## 2022-06-30 DIAGNOSIS — Z8673 Personal history of transient ischemic attack (TIA), and cerebral infarction without residual deficits: Secondary | ICD-10-CM | POA: Insufficient documentation

## 2022-06-30 DIAGNOSIS — J101 Influenza due to other identified influenza virus with other respiratory manifestations: Secondary | ICD-10-CM | POA: Insufficient documentation

## 2022-06-30 DIAGNOSIS — Z95810 Presence of automatic (implantable) cardiac defibrillator: Secondary | ICD-10-CM | POA: Insufficient documentation

## 2022-06-30 DIAGNOSIS — K219 Gastro-esophageal reflux disease without esophagitis: Secondary | ICD-10-CM | POA: Insufficient documentation

## 2022-06-30 DIAGNOSIS — F419 Anxiety disorder, unspecified: Secondary | ICD-10-CM | POA: Insufficient documentation

## 2022-06-30 DIAGNOSIS — I5030 Unspecified diastolic (congestive) heart failure: Secondary | ICD-10-CM | POA: Insufficient documentation

## 2022-06-30 DIAGNOSIS — R791 Abnormal coagulation profile: Secondary | ICD-10-CM | POA: Insufficient documentation

## 2022-06-30 NOTE — Telephone Encounter (Signed)
Patient wife called reporting that he is being admitted to [facility] with swelling and possible pneumonia and she is requesting a return call. I have cancelled tomorrows appointment

## 2022-07-01 ENCOUNTER — Inpatient Hospital Stay: Payer: Medicare HMO | Admitting: Internal Medicine

## 2022-07-01 ENCOUNTER — Encounter: Payer: Self-pay | Admitting: Internal Medicine

## 2022-07-01 ENCOUNTER — Other Ambulatory Visit: Payer: Self-pay | Admitting: *Deleted

## 2022-07-01 ENCOUNTER — Inpatient Hospital Stay: Payer: Medicare HMO

## 2022-07-01 DIAGNOSIS — C9110 Chronic lymphocytic leukemia of B-cell type not having achieved remission: Secondary | ICD-10-CM

## 2022-07-01 NOTE — Telephone Encounter (Signed)
Left a voicemail for the patient's wife/returning phone call.  Patient admitted to [facility] for flu.  Also noted to have elevated PT/INR- ?  Etiology. ? Acalabrutinib.  Recommend follow-up in 1 week- APP; labs-CBC CMP; PT/INR; PTT; quantitative immunoglobulins.   Thanks GB

## 2022-07-01 NOTE — Telephone Encounter (Signed)
Dr. Jacinto Reap, patient being discharged from [facility] today.  When would you like to have him rescheduled?

## 2022-07-02 ENCOUNTER — Telehealth: Payer: Self-pay | Admitting: Internal Medicine

## 2022-07-02 NOTE — Telephone Encounter (Signed)
I called patient's wife as requested.  Unable to reach-left a voice mail.  Recommend follow-up with APP next week.  GB

## 2022-07-03 ENCOUNTER — Telehealth: Payer: Self-pay

## 2022-07-03 NOTE — Telephone Encounter (Signed)
Transition Care Management Follow-up Telephone Call Date of discharge and from where: 07/01/22 [facility] - influenza How have you been since you were released from the hospital? Is able to walk on his own now with a cane, but still weak and has no appetite Any questions or concerns? No  Items Reviewed: Did the pt receive and understand the discharge instructions provided? Yes  Medications obtained and verified? Yes  Other? No  Any new allergies since your discharge? No  Dietary orders reviewed? Yes Do you have support at home? Yes   Home Care and Equipment/Supplies: Were home health services ordered? no If so, what is the name of the agency? N/a  Has the agency set up a time to come to the patient's home? not applicable Were any new equipment or medical supplies ordered?  No What is the name of the medical supply agency? N/a Were you able to get the supplies/equipment? not applicable Do you have any questions related to the use of the equipment or supplies? No  Functional Questionnaire: (I = Independent and D = Dependent) ADLs: D - very weak, getting more assistance that usual  Bathing/Dressing- I  Meal Prep- D  Eating- I  Maintaining continence- I  Transferring/Ambulation- I - with cane  Managing Meds- D  Follow up appointments reviewed:  PCP Hospital f/u appt confirmed? Yes  Scheduled to see [place] on 07/08/2022 @ [facility] f/u appt confirmed? No  - had to reschedule oncology appt Are transportation arrangements needed? No  - wife will drive If their condition worsens, is the pt aware to call PCP or go to the Emergency Dept.? Yes Was the patient provided with contact information for the PCP's office or ED? Yes Was to pt encouraged to call back with questions or concerns? Yes

## 2022-07-08 ENCOUNTER — Ambulatory Visit (INDEPENDENT_AMBULATORY_CARE_PROVIDER_SITE_OTHER): Payer: Medicare HMO | Admitting: Family Medicine

## 2022-07-08 ENCOUNTER — Encounter: Payer: Self-pay | Admitting: Family Medicine

## 2022-07-08 VITALS — BP 130/70 | HR 83 | Temp 97.5°F | Ht 69.0 in | Wt 188.0 lb

## 2022-07-08 DIAGNOSIS — J101 Influenza due to other identified influenza virus with other respiratory manifestations: Secondary | ICD-10-CM | POA: Diagnosis not present

## 2022-07-08 DIAGNOSIS — Z461 Encounter for fitting and adjustment of hearing aid: Secondary | ICD-10-CM | POA: Insufficient documentation

## 2022-07-08 DIAGNOSIS — J432 Centrilobular emphysema: Secondary | ICD-10-CM

## 2022-07-08 DIAGNOSIS — H903 Sensorineural hearing loss, bilateral: Secondary | ICD-10-CM | POA: Insufficient documentation

## 2022-07-08 DIAGNOSIS — I480 Paroxysmal atrial fibrillation: Secondary | ICD-10-CM

## 2022-07-08 DIAGNOSIS — I10 Essential (primary) hypertension: Secondary | ICD-10-CM

## 2022-07-08 DIAGNOSIS — S01319D Laceration without foreign body of unspecified ear, subsequent encounter: Secondary | ICD-10-CM | POA: Insufficient documentation

## 2022-07-08 DIAGNOSIS — R531 Weakness: Secondary | ICD-10-CM

## 2022-07-08 DIAGNOSIS — S08121A Partial traumatic amputation of right ear, initial encounter: Secondary | ICD-10-CM | POA: Insufficient documentation

## 2022-07-08 DIAGNOSIS — S01311S Laceration without foreign body of right ear, sequela: Secondary | ICD-10-CM | POA: Insufficient documentation

## 2022-07-08 NOTE — Assessment & Plan Note (Signed)
Likely worsened by recent influenza infection.  Discussed progressively increasing his activity level.  He declines any physical therapy at this time.

## 2022-07-08 NOTE — Progress Notes (Signed)
Michael Rumps, MD Phone: [phone]  [name] is a 76 y.o. male who presents today for follow-up.  Influenza A: Patient presented to outside hospital with dry cough and generalized weakness as well as decreased appetite.  He was found to be febrile and hypotensive.  Chest x-ray had mildly hyperinflated lungs but no acute pathology.  He received cefepime and vancomycin in the ED.  Those were discontinued once his influenza A test came back positive.  He was treated with 5 days of Tamiflu.  His hypotension improved with 1 L of LR.  TSH was found to be normal.  They also held his diuretics.  Diuretics were resumed at discharge.  Patient reports he has progressively been improving since discharge.  He notes he continues to feel weak though it does seem to be getting better.  He is still coughing some though it is improving.  He does have chronic cough and shortness of breath related to COPD.  He is on Symbicort and tiotropium.  He has chronic leg weakness issues where his legs just seem to give out when he walks.  He has done physical therapy for this multiple times.  Reports he met with home health recently and they both opted against doing more physical therapy.  He reports having had his circulation checked in his legs by cardiology in the past.  He reports he intermittently takes tramadol for the pain in his legs.  Patient reports BPs have been 112-130 over 70s at home.  He is taking 20 mg of Lasix daily.  No lightheadedness.  He also reports no palpitations.  He continues on Eliquis.  Social History   Tobacco Use  Smoking Status Former   Packs/day: 1.00   Years: 40.00   Total pack years: 40.00   Types: Cigarettes   Quit date: 07/21/2006   Years since quitting: 15.9  Smokeless Tobacco Never    Current Outpatient Medications on File Prior to Visit  Medication Sig Dispense Refill   acalabrutinib maleate (CALQUENCE) 100 MG tablet Take 1 tablet (100 mg total) by mouth 2 (two) times  daily. 60 tablet 2   acetaminophen (TYLENOL) 325 MG tablet Take 1-2 tablets (325-650 mg total) by mouth every 4 (four) hours as needed for mild pain.     acyclovir (ZOVIRAX) 400 MG tablet TAKE 1 TABLET(400 MG) BY MOUTH TWICE DAILY 60 tablet 6   albuterol (VENTOLIN HFA) 108 (90 Base) MCG/ACT inhaler Inhale 2 puffs into the lungs every 6 (six) hours as needed for wheezing or shortness of breath. 1 each 0   Alirocumab (PRALUENT) 150 MG/ML SOAJ INJECT 150 MG UNDER THE SKIN EVERY 14 DAYS 2 mL 2   amiodarone (PACERONE) 200 MG tablet Take 1/2 tablet (100 mg) by mouth twice daily 5 days a week 60 tablet 6   apixaban (ELIQUIS) 5 MG TABS tablet Take 5 mg by mouth 2 (two) times daily.     azelastine (ASTELIN) 0.1 % nasal spray Place 2 sprays into both nostrils 2 (two) times daily. Use in each nostril as directed 30 mL 1   budesonide-formoterol (SYMBICORT) 160-4.5 MCG/ACT inhaler Inhale 2 puffs into the lungs 2 (two) times daily. 1 Inhaler 11   cetirizine (ZYRTEC) 10 MG tablet Take 10 mg by mouth daily as needed for allergies.      Coenzyme Q10 (COQ10) 200 MG CAPS Take 200 mg by mouth daily.     diphenoxylate-atropine (LOMOTIL) 2.5-0.025 MG tablet Take 1 tablet by mouth 4 (four) times daily as  needed for diarrhea or loose stools. 45 tablet 0   ezetimibe (ZETIA) 10 MG tablet Take 1 tablet (10 mg total) by mouth daily. 90 tablet 3   furosemide (LASIX) 20 MG tablet Take 1 tablet (20 mg total) by mouth daily. (Patient taking differently: Take 10 mg by mouth daily.) 90 tablet 1   gabapentin (NEURONTIN) 300 MG capsule Take by mouth.     Krill Oil 350 MG CAPS Take 350 mg by mouth daily as needed (Heart).     mirtazapine (REMERON) 15 MG tablet Take 15 mg by mouth at bedtime as needed (for panic associated with PTSD).      Multiple Vitamin (MULTIVITAMIN WITH MINERALS) TABS tablet Take 1 tablet by mouth daily.     nitroGLYCERIN (NITROSTAT) 0.4 MG SL tablet Place 1 tablet (0.4 mg total) under the tongue every 5 (five)  minutes as needed for chest pain. 25 tablet 6   oseltamivir (TAMIFLU) 75 MG capsule Take by mouth 2 (two) times daily.     pantoprazole (PROTONIX) 40 MG tablet Take 1 tablet by mouth daily.     Tiotropium Bromide Monohydrate 2.5 MCG/ACT AERS Inhale 2 puffs into the lungs at bedtime. Spiriva     traMADol (ULTRAM) 50 MG tablet Take 1 tablet (50 mg total) by mouth every 12 (twelve) hours as needed. 60 tablet 0   No current facility-administered medications on file prior to visit.     ROS see history of present illness  Objective  Physical Exam Vitals:   07/08/22 0904  BP: 130/70  Pulse: 83  Temp: (!) 97.5 F (36.4 C)  SpO2: 96%    BP Readings from Last 3 Encounters:  07/08/22 130/70  06/02/22 127/62  05/29/22 101/60   Wt Readings from Last 3 Encounters:  07/08/22 188 lb (85.3 kg)  06/02/22 197 lb (89.4 kg)  05/29/22 195 lb 9.6 oz (88.7 kg)    Physical Exam Constitutional:      General: He is not in acute distress.    Appearance: He is not diaphoretic.  Cardiovascular:     Rate and Rhythm: Normal rate and regular rhythm.     Heart sounds: Normal heart sounds.  Pulmonary:     Effort: Pulmonary effort is normal.     Breath sounds: Normal breath sounds.  Skin:    General: Skin is warm and dry.  Neurological:     Mental Status: He is alert.      Assessment/Plan: Please see individual problem list.  Paroxysmal atrial fibrillation ([place]) Assessment & Plan: Rate controlled.  He will continue Eliquis 5 mg twice daily.  Amiodarone managed by cardiology.   Essential hypertension Assessment & Plan: Blood pressure has improved since discharge.  He can continue taking Lasix 20 mg daily.   Centrilobular emphysema ([place]) Assessment & Plan: Stable.  He will continue to Tropium 2 puffs daily and Symbicort 2 puffs twice daily.   Influenza A Assessment & Plan: Patient has recovered quite well.  Discussed it may take another few weeks for him to get back to his baseline.   Discussed increasing his activity progressively.  If he has any worsening symptoms he will let us know.   Weakness Assessment & Plan: Likely worsened by recent influenza infection.  Discussed progressively increasing his activity level.  He declines any physical therapy at this time.   Labs to be completed by oncology tomorrow.  Return in about 3 months (around 10/07/2022) for f/u.   Michael Rumps, MD [place]  [facility]  

## 2022-07-08 NOTE — Assessment & Plan Note (Signed)
Rate controlled.  He will continue Eliquis 5 mg twice daily.  Amiodarone managed by cardiology.

## 2022-07-08 NOTE — Patient Instructions (Signed)
Nice to see you. Please let me know if you start to feel worse again.

## 2022-07-08 NOTE — Assessment & Plan Note (Signed)
Blood pressure has improved since discharge.  He can continue taking Lasix 20 mg daily.

## 2022-07-08 NOTE — Assessment & Plan Note (Signed)
Patient has recovered quite well.  Discussed it may take another few weeks for him to get back to his baseline.  Discussed increasing his activity progressively.  If he has any worsening symptoms he will let us know.

## 2022-07-08 NOTE — Assessment & Plan Note (Signed)
Stable.  He will continue to Tropium 2 puffs daily and Symbicort 2 puffs twice daily.

## 2022-07-09 ENCOUNTER — Inpatient Hospital Stay: Payer: Medicare HMO | Attending: Internal Medicine

## 2022-07-09 ENCOUNTER — Encounter: Payer: Self-pay | Admitting: Medical Oncology

## 2022-07-09 ENCOUNTER — Other Ambulatory Visit: Payer: Self-pay

## 2022-07-09 ENCOUNTER — Inpatient Hospital Stay (HOSPITAL_BASED_OUTPATIENT_CLINIC_OR_DEPARTMENT_OTHER): Payer: Medicare HMO | Admitting: Medical Oncology

## 2022-07-09 VITALS — BP 117/65 | HR 73 | Temp 97.6°F | Wt 195.0 lb

## 2022-07-09 DIAGNOSIS — D649 Anemia, unspecified: Secondary | ICD-10-CM | POA: Diagnosis not present

## 2022-07-09 DIAGNOSIS — Z7901 Long term (current) use of anticoagulants: Secondary | ICD-10-CM | POA: Diagnosis not present

## 2022-07-09 DIAGNOSIS — R531 Weakness: Secondary | ICD-10-CM

## 2022-07-09 DIAGNOSIS — E860 Dehydration: Secondary | ICD-10-CM

## 2022-07-09 DIAGNOSIS — I4891 Unspecified atrial fibrillation: Secondary | ICD-10-CM | POA: Diagnosis not present

## 2022-07-09 DIAGNOSIS — C9110 Chronic lymphocytic leukemia of B-cell type not having achieved remission: Secondary | ICD-10-CM

## 2022-07-09 DIAGNOSIS — Z8673 Personal history of transient ischemic attack (TIA), and cerebral infarction without residual deficits: Secondary | ICD-10-CM | POA: Diagnosis not present

## 2022-07-09 DIAGNOSIS — Z79899 Other long term (current) drug therapy: Secondary | ICD-10-CM | POA: Diagnosis not present

## 2022-07-09 DIAGNOSIS — R197 Diarrhea, unspecified: Secondary | ICD-10-CM

## 2022-07-09 DIAGNOSIS — E876 Hypokalemia: Secondary | ICD-10-CM

## 2022-07-09 LAB — CBC WITH DIFFERENTIAL/PLATELET
Abs Immature Granulocytes: 0.04 10*3/uL (ref 0.00–0.07)
Basophils Absolute: 0 10*3/uL (ref 0.0–0.1)
Basophils Relative: 0 %
Eosinophils Absolute: 0.1 10*3/uL (ref 0.0–0.5)
Eosinophils Relative: 1 %
HCT: 31.8 % — ABNORMAL LOW (ref 39.0–52.0)
Hemoglobin: 10.4 g/dL — ABNORMAL LOW (ref 13.0–17.0)
Immature Granulocytes: 0 %
Lymphocytes Relative: 61 %
Lymphs Abs: 5.5 10*3/uL — ABNORMAL HIGH (ref 0.7–4.0)
MCH: 33.3 pg (ref 26.0–34.0)
MCHC: 32.7 g/dL (ref 30.0–36.0)
MCV: 101.9 fL — ABNORMAL HIGH (ref 80.0–100.0)
Monocytes Absolute: 0.5 10*3/uL (ref 0.1–1.0)
Monocytes Relative: 5 %
Neutro Abs: 3.1 10*3/uL (ref 1.7–7.7)
Neutrophils Relative %: 33 %
Platelets: 230 10*3/uL (ref 150–400)
RBC: 3.12 MIL/uL — ABNORMAL LOW (ref 4.22–5.81)
RDW: 14.9 % (ref 11.5–15.5)
Smear Review: NORMAL
WBC: 9.2 10*3/uL (ref 4.0–10.5)
nRBC: 0 % (ref 0.0–0.2)

## 2022-07-09 LAB — COMPREHENSIVE METABOLIC PANEL
ALT: 26 U/L (ref 0–44)
AST: 23 U/L (ref 15–41)
Albumin: 3.7 g/dL (ref 3.5–5.0)
Alkaline Phosphatase: 82 U/L (ref 38–126)
Anion gap: 7 (ref 5–15)
BUN: 16 mg/dL (ref 8–23)
CO2: 25 mmol/L (ref 22–32)
Calcium: 8.4 mg/dL — ABNORMAL LOW (ref 8.9–10.3)
Chloride: 111 mmol/L (ref 98–111)
Creatinine, Ser: 0.93 mg/dL (ref 0.61–1.24)
GFR, Estimated: >60 mL/min (ref >60)
Glucose, Bld: 98 mg/dL (ref 70–99)
Potassium: 3.7 mmol/L (ref 3.5–5.1)
Sodium: 143 mmol/L (ref 135–145)
Total Bilirubin: 0.6 mg/dL (ref 0.3–1.2)
Total Protein: 7 g/dL (ref 6.5–8.1)

## 2022-07-09 LAB — VITAMIN B12: Vitamin B-12: 615 pg/mL (ref 180–914)

## 2022-07-09 LAB — MAGNESIUM: Magnesium: 2.2 mg/dL (ref 1.7–2.4)

## 2022-07-09 LAB — APTT: aPTT: 31 seconds (ref 24–36)

## 2022-07-09 LAB — PROTIME-INR
INR: 1.4 — ABNORMAL HIGH (ref 0.8–1.2)
Prothrombin Time: 16.7 seconds — ABNORMAL HIGH (ref 11.4–15.2)

## 2022-07-09 NOTE — Progress Notes (Addendum)
[facility] OFFICE PROGRESS NOTE  Patient Care Team: Leone Haven, MD as PCP - General (Family Medicine) Rockey Situ Kathlene November, MD as PCP - Cardiology (Cardiology) [name], MD as Consulting Physician (Cardiology) Cammie Sickle, MD as Medical Oncologist (Hematology and Oncology) Gloris Ham, RN as Registered Nurse (Oncology)   Cancer Staging  No matching staging information was found for the patient.   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  MARCH 2017- CT N/C/A/P- NED. STOP Ritux; surveillance   # AUG 2019- CT/PET- progression/NO transformation;  # NOV 2019- Progression; started Ibrutinib 420 mg/d. STOPPED in end of feb sec to extreme fatigue/joint pains/cramps  #November 01, 2018-start ibrutinib 280 mg a day; December 20, 2018-discontinue ibrutinib secondary multiple side effects.   #January 03, 2019 start Gazyva + Ven [July]; finished Dec 2020-; started VLawson Fiscalmaintenance [200 mg a day; DI-amiodarone]; SEP 2022- congestive heart failure; s/p cardiac cath-noted to have CAD however no stents placed.  2D echo- EF-55%  #Mid March 2021-venetoclax dose reduced to 100 mg [second diarrhea]. [place]multiple admissions to the hospital/pneumonia infection stroke x 3 m.   # MAY 2023-progression of disease in the neck ches; Nov 18, 2021-RE-started venetoclax;   # JUNE 29th, 2023-progression of disease in the neck- STOP VENAOCLAX;   # AUG 16th, 2023 START ACALABRUTINIB 100 mg BID [previous intolerance to ibrutinib; delayed sec to C.diff]    #? 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    Ambulating with a cane.   Michael BENDICKSON89y.o.  male CLL-high risk of recurrent/relapsed currently on on alcabrutinib is here for a follow up.   Patient reports that he is doing well. He has been carving wood figures which brings him a lot of joy. He is very talented at this. He denies any bleeding episodes, excessive fatigue, recent illness.   \Review of Systems  Constitutional:  Negative for chills, diaphoresis, fever and malaise/fatigue.  HENT:  Negative for nosebleeds and sore throat.   Eyes:  Negative for double vision.  Respiratory:  Negative for hemoptysis, shortness of breath and wheezing.   Cardiovascular:  Negative for chest pain, palpitations, orthopnea and leg swelling.  Gastrointestinal:  Negative for abdominal pain, blood in stool, constipation, diarrhea, heartburn, melena, nausea and vomiting.  Genitourinary:  Negative for dysuria, frequency and urgency.  Musculoskeletal:  Negative for back pain, joint pain and myalgias.  Skin: Negative.  Negative for itching and rash.  Neurological:  Negative for dizziness, tingling, focal weakness and headaches.  Psychiatric/Behavioral:  Negative for depression. The patient is not nervous/anxious and does not have insomnia.       PAST MEDICAL HISTORY :  Past Medical History:  Diagnosis Date   Anxiety    Arthritis    Atrial fibrillation ([place])    a. Dx 2013, recurred 02/2014, CHA2DS2VASc = 3 -->placed on Eliquis;  b. 02/2014 Echo: EF 50-55%, mid and apical anterior septum and mid and  apical inf septum are abnl, mild to mod Ao sclerosis w/o AS.   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 01/2021 LV Gram: EF 35-45%; b. 02/2021 Echo: EF 55-60%, no rwma, GrI DD, mildly reduced RV fxn, triv MR, mild-mod AoV sclerosis w/o stenosis.   Chronic lymphocytic leukemia ([place])    a. Dx 02/2014.   Complication of anesthesia    History of  PTSD--do not touch patient when waking up from surgery.   COPD (chronic obstructive pulmonary disease) (HCC)    Coronary artery disease    a. 04/2009 CABG x 3 (LIMA->LAD, VG->OM1, VG->PDA);  b. 09/2009 Cath: occluded VG x 2 w/ patent LIMA and L->R collats. EF 55%, mild antlat HK;  c. 01/2021 Cath: LM mild diff dzs, LAD 100p, LCX nl, RCA 50p, 95d, 99 side branch, RPAV fills via LCX. VG->RPDA 100, VG->OM1 100, LIMA->LAD nl. EF 35-45%.   GERD (gastroesophageal reflux disease)    occasional   History of chemotherapy 2015-2016   HOH (hard of hearing)    Bilateral Hearing Aids   Hyperlipidemia LDL goal <70    Hypertension    Ischemic cardiomyopathy    a. 01/2021 LV Gram: EF 35-45%; b. 02/2021 Echo: EF 55-60%.   Myocardial infarction ([place]) 2010   OSA on CPAP    USE C-PAP   PTSD (post-traumatic stress disorder)    Rheumatic fever 1959   SSS (sick sinus syndrome) ([place])    a. 02/2016 s/p MDT MRI compatible DC PPM (ser# [id number]).   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   Stroke [facility])    TIA (transient ischemic attack) 11/02/2015    PAST SURGICAL HISTORY :   Past Surgical History:  Procedure Laterality Date   ABDOMINAL HERNIA REPAIR     APPENDECTOMY  06/21/1985   CARDIAC CATHETERIZATION  2010; 2011   ; Dr Fletcher Anon   CORONARY ARTERY BYPASS GRAFT  04/2009   "CABG X3"   EP IMPLANTABLE DEVICE N/A 03/03/2016   Procedure: Pacemaker Implant;  Surgeon: Deboraha Sprang, MD;  Location: [facility];  Service: Cardiovascular;  Laterality: N/A;   FOREIGN BODY REMOVAL   1968   "shrapnel in my tailbone"   INGUINAL HERNIA REPAIR Right    INSERT / REPLACE / [place] Right 2018   LAPAROSCOPIC CHOLECYSTECTOMY     RIGHT/LEFT HEART CATH AND CORONARY/GRAFT ANGIOGRAPHY N/A 02/04/2021   Procedure: RIGHT/LEFT HEART CATH AND CORONARY/GRAFT ANGIOGRAPHY;  Surgeon: Nelva Bush, MD;  Location: [facility];  Service: Cardiovascular;  Laterality: N/A;   TONSILLECTOMY AND ADENOIDECTOMY  1956   TOTAL HIP ARTHROPLASTY Right 10/22/2016   Procedure: TOTAL HIP ARTHROPLASTY;  Surgeon: Dereck Leep, MD;  Location: [facility];  Service: Orthopedics;  Laterality: Right;   TOTAL HIP ARTHROPLASTY Left 11/04/2017   Procedure: TOTAL HIP ARTHROPLASTY;  Surgeon: Dereck Leep, MD;  Location: [facility];  Service: Orthopedics;  Laterality: Left;    FAMILY HISTORY :   Family History  Problem Relation Age of Onset   Heart disease Mother    Heart attack Mother    Coronary artery disease Other  family history    SOCIAL HISTORY:   Social History   Tobacco Use   Smoking status: Former    Packs/day: 1.00    Years: 40.00    Total pack years: 40.00    Types: Cigarettes    Quit date: 07/21/2006    Years since quitting: 15.9   Smokeless tobacco: Never  Vaping Use   Vaping Use: Former  Substance Use Topics   Alcohol use: Not Currently   Drug use: No    ALLERGIES:  is allergic to augmentin [amoxicillin-pot clavulanate].  MEDICATIONS:  Current Outpatient Medications  Medication Sig Dispense Refill   acalabrutinib maleate (CALQUENCE) 100 MG tablet Take 1 tablet (100 mg total) by mouth 2 (two) times daily. 60 tablet 2   acetaminophen (TYLENOL) 325 MG tablet Take 1-2 tablets (325-650 mg total) by mouth every 4 (four) hours as needed for mild pain.     acyclovir (ZOVIRAX) 400 MG tablet TAKE 1 TABLET(400 MG) BY MOUTH TWICE DAILY 60 tablet 6   albuterol (VENTOLIN HFA) 108 (90 Base) MCG/ACT inhaler Inhale 2 puffs into the lungs every 6 (six)  hours as needed for wheezing or shortness of breath. 1 each 0   Alirocumab (PRALUENT) 150 MG/ML SOAJ INJECT 150 MG UNDER THE SKIN EVERY 14 DAYS 2 mL 2   amiodarone (PACERONE) 200 MG tablet Take 1/2 tablet (100 mg) by mouth twice daily 5 days a week 60 tablet 6   apixaban (ELIQUIS) 5 MG TABS tablet Take 5 mg by mouth 2 (two) times daily.     azelastine (ASTELIN) 0.1 % nasal spray Place 2 sprays into both nostrils 2 (two) times daily. Use in each nostril as directed 30 mL 1   budesonide-formoterol (SYMBICORT) 160-4.5 MCG/ACT inhaler Inhale 2 puffs into the lungs 2 (two) times daily. 1 Inhaler 11   cetirizine (ZYRTEC) 10 MG tablet Take 10 mg by mouth daily as needed for allergies.      Coenzyme Q10 (COQ10) 200 MG CAPS Take 200 mg by mouth daily.     diphenoxylate-atropine (LOMOTIL) 2.5-0.025 MG tablet Take 1 tablet by mouth 4 (four) times daily as needed for diarrhea or loose stools. 45 tablet 0   ezetimibe (ZETIA) 10 MG tablet Take 1 tablet (10 mg total) by mouth daily. 90 tablet 3   furosemide (LASIX) 20 MG tablet Take 1 tablet (20 mg total) by mouth daily. (Patient taking differently: Take 10 mg by mouth daily.) 90 tablet 1   gabapentin (NEURONTIN) 300 MG capsule Take by mouth.     Krill Oil 350 MG CAPS Take 350 mg by mouth daily as needed (Heart).     mirtazapine (REMERON) 15 MG tablet Take 15 mg by mouth at bedtime as needed (for panic associated with PTSD).      Multiple Vitamin (MULTIVITAMIN WITH MINERALS) TABS tablet Take 1 tablet by mouth daily.     nitroGLYCERIN (NITROSTAT) 0.4 MG SL tablet Place 1 tablet (0.4 mg total) under the tongue every 5 (five) minutes as needed for chest pain. 25 tablet 6   oseltamivir (TAMIFLU) 75 MG capsule Take by mouth 2 (two) times daily.     pantoprazole (PROTONIX) 40 MG tablet Take 1 tablet by mouth daily.     Tiotropium Bromide Monohydrate 2.5 MCG/ACT AERS Inhale 2 puffs into the lungs at bedtime. Spiriva     traMADol (ULTRAM) 50 MG tablet Take 1 tablet (50  mg total) by mouth every 12 (twelve) hours as needed. 60 tablet 0   No current  facility-administered medications for this visit.    PHYSICAL EXAMINATION: ECOG PERFORMANCE STATUS: 1 - Symptomatic but completely ambulatory  BP 117/65 (BP Location: Right Arm, Patient Position: Sitting)   Pulse 73   Temp 97.6 F (36.4 C) (Oral)   Wt 195 lb (88.5 kg)   SpO2 98%   BMI 28.80 kg/m   Filed Weights   07/09/22 1337  Weight: 195 lb (88.5 kg)   Physical Exam HENT:     Head: Normocephalic and atraumatic.     Mouth/Throat:     Pharynx: No oropharyngeal exudate.  Eyes:     Pupils: Pupils are equal, round, and reactive to light.  Cardiovascular:     Rate and Rhythm: Normal rate and regular rhythm.  Pulmonary:     Effort: No respiratory distress.     Breath sounds: No wheezing.  Abdominal:     General: Bowel sounds are normal. There is no distension.     Palpations: Abdomen is soft. There is no mass.     Tenderness: There is no abdominal tenderness. There is no guarding or rebound.  Musculoskeletal:        General: No tenderness. Normal range of motion.     Cervical back: Normal range of motion and neck supple.  Skin:    General: Skin is warm.  Neurological:     Mental Status: He is alert and oriented to person, place, and time.  Psychiatric:        Mood and Affect: Affect normal.     LABORATORY DATA:  I have reviewed the data as listed    Component Value Date/Time   NA 143 07/09/2022 1321   NA 143 03/11/2021 1136   NA 139 10/11/2014 1800   K 3.7 07/09/2022 1321   K 3.3 (L) 10/11/2014 1800   CL 111 07/09/2022 1321   CL 106 10/11/2014 1800   CO2 25 07/09/2022 1321   CO2 27 10/11/2014 1800   GLUCOSE 98 07/09/2022 1321   GLUCOSE 107 (H) 10/11/2014 1800   BUN 16 07/09/2022 1321   BUN 13 03/11/2021 1136   BUN 15 10/11/2014 1800   CREATININE 0.93 07/09/2022 1321   CREATININE 0.89 10/11/2014 1800   CALCIUM 8.4 (L) 07/09/2022 1321   CALCIUM 8.8 (L) 10/11/2014 1800   PROT  7.0 07/09/2022 1321   PROT 6.7 05/18/2017 1048   PROT 6.4 (L) 10/11/2014 1800   ALBUMIN 3.7 07/09/2022 1321   ALBUMIN 4.3 05/18/2017 1048   ALBUMIN 4.1 10/11/2014 1800   AST 23 07/09/2022 1321   AST 23 10/11/2014 1800   ALT 26 07/09/2022 1321   ALT 22 10/11/2014 1800   ALKPHOS 82 07/09/2022 1321   ALKPHOS 61 10/11/2014 1800   BILITOT 0.6 07/09/2022 1321   BILITOT 0.7 05/18/2017 1048   BILITOT 0.9 10/11/2014 1800   GFRNONAA >60 07/09/2022 1321   GFRNONAA >60 10/11/2014 1800   GFRAA >60 04/12/2020 0959   GFRAA >60 10/11/2014 1800    No results found for: "SPEP", "UPEP"  Lab Results  Component Value Date   WBC 9.2 07/09/2022   NEUTROABS 3.1 07/09/2022   HGB 10.4 (L) 07/09/2022   HCT 31.8 (L) 07/09/2022   MCV 101.9 (H) 07/09/2022   PLT 230 07/09/2022      Chemistry      Component Value Date/Time   NA 143 07/09/2022 1321   NA 143 03/11/2021 1136   NA 139 10/11/2014 1800   K 3.7 07/09/2022 1321   K 3.3 (L) 10/11/2014 1800  CL 111 07/09/2022 1321   CL 106 10/11/2014 1800   CO2 25 07/09/2022 1321   CO2 27 10/11/2014 1800   BUN 16 07/09/2022 1321   BUN 13 03/11/2021 1136   BUN 15 10/11/2014 1800   CREATININE 0.93 07/09/2022 1321   CREATININE 0.89 10/11/2014 1800      Component Value Date/Time   CALCIUM 8.4 (L) 07/09/2022 1321   CALCIUM 8.8 (L) 10/11/2014 1800   ALKPHOS 82 07/09/2022 1321   ALKPHOS 61 10/11/2014 1800   AST 23 07/09/2022 1321   AST 23 10/11/2014 1800   ALT 26 07/09/2022 1321   ALT 22 10/11/2014 1800   BILITOT 0.6 07/09/2022 1321   BILITOT 0.7 05/18/2017 1048   BILITOT 0.9 10/11/2014 1800       RADIOGRAPHIC STUDIES: I have personally reviewed the radiological images as listed and agreed with the findings in the report. No results found.   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  MAY 27th, 2023- CT neck- Increased adenopathy above the diaphragm with stable  prominent/mildly enlarged lymph nodes below the diaphragm and no splenomegaly.   # On Acalabrutnib [AUG 16th, 2023]- SEP 27th, 2023- CT neck improvement of swelling LN- will repeat CT scan CAP in [place].  Tolerating well continue the same.  Patient admits to compliance and good tolerance. Imaging due to be repeated- orders submitted for CT neck/C/A/P  # Left sided CVA- [s/p rehab; April 2023]- STABLE. No changes to plan at this time.    # Low immunoglobulins IgG 330/recent severe respiratory infection/CLL-s/pI IVIG #4/4 [last April 2022].NOV 2022- IgG > 500. Pending today   # A.fib-on amiodarone [ Dr.Gollan] sinus rhythm-on Eliquis/CHF- decreased lasix to 20/d due to hypotension- s/p Cath [AUG, 2022- Echo-55%] -STABLE. No changes to plan at this time.    # Mild Anemia Hb ~10-11- today his Hgb is 10.4 STABLE. We will continue to monitor. B12 pending given MCV   # Joint pains/ Muscle pain- continue tramadol/CBD prn- STABLE. No changes to plan today      DISPOSITION: #  follow up in 1 month-  MD; labs- cbc/cmp   Orders Placed This Encounter  Procedures   CT SOFT TISSUE NECK W CONTRAST    Standing Status:   Future    Standing Expiration Date:   07/12/2023    Order Specific Question:   If indicated for the ordered procedure, I authorize the administration of contrast media per Radiology protocol    Answer:   Yes    Order Specific Question:   Does the patient have a contrast media/X-ray dye allergy?    Answer:   No    Order Specific Question:   Preferred imaging location?    Answer:   [facility]   CT CHEST ABDOMEN PELVIS W CONTRAST    Standing Status:   Future    Standing Expiration Date:   07/12/2023    Order Specific Question:   If indicated for the ordered procedure, I authorize the administration of contrast media per Radiology protocol    Answer:   Yes    Order Specific Question:   Does the patient have a contrast media/X-ray dye allergy?    Answer:   No    Order Specific  Question:   Preferred imaging location?    Answer:   [facility]    Order Specific Question:   Is Oral Contrast requested for this exam?    Answer:   Yes, Per Radiology  protocol    All questions were answered. The patient knows to call the clinic with any problems, questions or concerns.      Hughie Closs, PA-C 07/11/2022 2:55 PM

## 2022-07-11 ENCOUNTER — Encounter: Payer: Self-pay | Admitting: Internal Medicine

## 2022-07-13 LAB — IMMUNOGLOBULINS A/E/G/M, SERUM
IgA: 47 mg/dL — ABNORMAL LOW (ref 61–437)
IgE (Immunoglobulin E), Serum: 317 IU/mL (ref 6–495)
IgG (Immunoglobin G), Serum: 1543 mg/dL (ref 603–1613)
IgM (Immunoglobulin M), Srm: 31 mg/dL (ref 15–143)

## 2022-07-14 ENCOUNTER — Encounter: Payer: Self-pay | Admitting: Internal Medicine

## 2022-07-23 ENCOUNTER — Ambulatory Visit
Admission: RE | Admit: 2022-07-23 | Discharge: 2022-07-23 | Disposition: A | Discharge status: Home / Self Care | Payer: Medicare HMO | Source: Ambulatory Visit | Attending: Medical Oncology | Admitting: Medical Oncology

## 2022-07-23 DIAGNOSIS — C9110 Chronic lymphocytic leukemia of B-cell type not having achieved remission: Secondary | ICD-10-CM | POA: Diagnosis present

## 2022-07-23 MED ORDER — IOHEXOL 300 MG/ML  SOLN
100.0000 mL | Freq: Once | INTRAMUSCULAR | Status: AC | PRN
  Administered 2022-07-23: 100 mL via INTRAVENOUS

## 2022-07-28 ENCOUNTER — Other Ambulatory Visit: Payer: Self-pay | Admitting: *Deleted

## 2022-07-28 DIAGNOSIS — C9110 Chronic lymphocytic leukemia of B-cell type not having achieved remission: Secondary | ICD-10-CM

## 2022-07-28 MED ORDER — ACALABRUTINIB MALEATE 100 MG PO TABS: 100.0000 mg | ORAL_TABLET | Freq: Two times a day (BID) | ORAL | 2 refills | Status: DC

## 2022-07-28 NOTE — Telephone Encounter (Signed)
Patient was seen by Judson Roch on 07/09/22 with check out--DISPOSITION:  # follow up in 1 month-  MD; labs- cbc/cmp   Unfortunately this was not scheduled.  Will get him scheduled as recommended.

## 2022-07-28 NOTE — Telephone Encounter (Signed)
CBC with Differential/Platelet Order: [id number] Status: Final result     Visible to patient: Yes (seen)     Next appt: 08/05/2022 at 08:10 AM in Cardiology ([facility] Device Remotes)     Dx: CLL (chronic lymphocytic leukemia) ([place])   0 Result Notes           Component Ref Range & Units 2 wk ago (07/09/22) 1 mo ago (06/02/22) 2 mo ago (05/19/22) 2 mo ago (05/02/22) 4 mo ago (03/17/22) 4 mo ago (03/10/22) 4 mo ago (03/04/22)  WBC 4.0 - 10.5 K/uL 9.2 11.4 High  13.0 High  8.6 9.6 9.3 9.2  RBC 4.22 - 5.81 MIL/uL 3.12 Low  3.29 Low  3.13 Low  3.15 Low  3.43 Low  3.12 Low  3.62 Low   Hemoglobin 13.0 - 17.0 g/dL 10.4 Low  11.0 Low  10.3 Low  10.5 Low  11.2 Low  10.1 Low  11.7 Low   HCT 39.0 - 52.0 % 31.8 Low  34.1 Low  31.3 Low  32.2 Low  34.8 Low  31.8 Low  36.5 Low   MCV 80.0 - 100.0 fL 101.9 High  103.6 High  100.0 102.2 High  101.5 High  101.9 High  100.8 High   MCH 26.0 - 34.0 pg 33.3 33.4 32.9 33.3 32.7 32.4 32.3  MCHC 30.0 - 36.0 g/dL 32.7 32.3 32.9 32.6 32.2 31.8 32.1  RDW 11.5 - 15.5 % 14.9 14.9 15.1 15.4 15.7 High  16.4 High  15.4  Platelets 150 - 400 K/uL 230 184 155 194 217 155 140 Low   nRBC 0.0 - 0.2 % 0.0 0.0 0.0 CM 0.0 0.0 0.0 0.0  Neutrophils Relative % % 33 36  32 38 42 41  Neutro Abs 1.7 - 7.7 K/uL 3.1 4.1  2.7 3.6 3.9 3.8  Lymphocytes Relative % 61 58  58 50 46 42  Lymphs Abs 0.7 - 4.0 K/uL 5.5 High  6.6 High   5.1 High  4.9 High  4.3 High  3.8  Monocytes Relative % '5 4  6 5 10 9  '$ Monocytes Absolute 0.1 - 1.0 K/uL 0.5 0.5  0.6 0.5 1.0 0.9  Eosinophils Relative % '1 1  3 5 2 7  '$ Eosinophils Absolute 0.0 - 0.5 K/uL 0.1 0.1  0.2 0.5 0.1 0.7 High   Basophils Relative % 0 0  1 1 0 1  Basophils Absolute 0.0 - 0.1 K/uL 0.0 0.1  0.0 0.1 0.0 0.1  WBC Morphology  DIFF. CONFIRMED BY SMEAR        RBC Morphology  MORPHOLOGY UNREMARKABLE        Smear Review  Normal platelet morphology        Comment: PLATELETS APPEAR ADEQUATE  Immature Granulocytes % 0 1  0 1 0 0  Abs Immature  Granulocytes 0.00 - 0.07 K/uL 0.04 0.08 High  CM  0.02 CM 0.08 High  CM 0.03 CM 0.04 CM  Smudge Cells  PRESENT        Comment: Performed at [facility], [address] [place], [place] [address]  Resulting Agency  [facility] CLIN LAB [place] CLIN LAB [place] CL[facility] [place] CL[facility] LAB [place] [facility] [place] [facility] CLIN LAB         Specimen Collected: 07/09/22 13:21 Last Resulted: 07/09/22 14:11      Lab Flowsheet      Order Details      View Encounter      Lab and Collection Details  Routing      Result History    View All Conversations on this Encounter      CM=Additional comments      Result Care Coordination   Patient Communication   Add Comments   Seen Back to Top      Other Results from 07/09/2022   Contains abnormal data Immunoglobulins, QN, A/E/G/M Order: [id number] Status: Final result      Visible to patient: Yes (seen)      Next appt: 08/05/2022 at 08:10 AM in Cardiology ([facility] Device Remotes)      Dx: CLL (chronic lymphocytic leukemia) ([facility])    0 Result Notes           Component Ref Range & Units 2 wk ago 2 mo ago 10 mo ago 1 yr ago 2 yr ago  IgG (Immunoglobin G), Serum 603 - 1,613 mg/dL 1,543 1,605 487 Low  557 Low  310 Low   IgA 61 - 437 mg/dL 47 Low  56 Low  27 Low  CM 25 Low  CM 24 Low  CM  Comment: Result confirmed on concentration.  IgM (Immunoglobulin M), Srm 15 - 143 mg/dL 31 60 20 CM 10 Low  CM <5 Low  CM  IgE (Immunoglobulin E), Serum 6 - 495 IU/mL 317 567 High  CM 43 CM 57 CM 5 Low  CM  Comment: (NOTE) Performed At: [facility] [address] [place], [place] [id number] Rush Farmer MD HQ:[id number]  Resulting Agency  [place] CLIN LAB [place] CL[facility] [place] CL[facility] [place] [facility] CLIN LAB         Specimen Collected: 07/09/22 13:21 Last Resulted: 07/13/22 20:35      Lab Flowsheet       Order Details       View Encounter       Lab and Collection Details       Routing       Result History     View All Conversations on this  Encounter      CM=Additional comments      Result Care Coordination   Patient Communication   Add Comments   Seen Back to Top        APTT Order: [id number] Status: Final result      Visible to patient: Yes (seen)      Next appt: 08/05/2022 at 08:10 AM in Cardiology ([facility] Device Remotes)      Dx: CLL (chronic lymphocytic leukemia) ([place])    0 Result Notes             Component Ref Range & Units 2 wk ago (07/09/22) 9 mo ago (10/18/21) 4 yr ago (03/23/18) 4 yr ago (10/27/17) 5 yr ago (10/15/16) 6 yr ago (03/04/16) 6 yr ago (03/03/16)  aPTT 24 - 36 seconds 31 40 High  CM 33 CM 32 CM 72 53 66 High  CM  Comment: Performed at [facility], [address] [place], [place] [address]  Resulting Agency  [facility] CLIN LAB [place] CLIN LAB [place] CLIN LAB [place] CL[facility] [place] [facility] [place] CL[facility] LAB [place] [facility]IN LAB         Specimen Collected: 07/09/22 13:21 Last Resulted: 07/09/22 14:06      Lab Flowsheet       Order Details       View Encounter       Lab and Collection Details       Routing       Result History  View All Conversations on this Encounter      CM=Additional comments      Result Care Coordination   Patient Communication   Add Comments   Seen Back to Top         Contains abnormal data Protime-INR Order: [id number] Status: Final result      Visible to patient: Yes (seen)      Next appt: 08/05/2022 at 08:10 AM in Cardiology ([facility] Device Remotes)      Dx: CLL (chronic lymphocytic leukemia) ([place])    0 Result Notes             Component Ref Range & Units 2 wk ago (07/09/22) 9 mo ago (10/18/21) 4 yr ago (03/23/18) 4 yr ago (11/04/17) 4 yr ago (10/27/17) 5 yr ago (10/15/16) 6 yr ago (03/01/16)  Prothrombin Time 11.4 - 15.2 seconds 16.7 High  18.6 High  16.4 High  14.2 16.5 High  14.9 16.1 High   INR 0.8 - 1.2 1.4 High  1.6 High  CM 1.33 R, CM 1.11 R, CM 1.34 R, CM 1.16 R 1.28 R  Comment: (NOTE) INR goal varies based on device and disease  states. Performed at [facility], [address] [place], [place] [address]  Resulting Agency  [facility] CLIN [place] CLIN LAB [place] CLIN LAB [place] CLIN LAB [place] CLIN LAB [place] CLIN LAB [place] [facility]IN LAB         Specimen Collected: 07/09/22 13:21 Last Resulted: 07/09/22 14:06      Lab Flowsheet       Order Details       View Encounter       Lab and Collection Details       Routing       Result History     View All Conversations on this Encounter      CM=Additional comments  R=Reference range differs from displayed range      Result Care Coordination   Patient Communication   Add Comments   Seen Back to Top         Contains abnormal data Comprehensive metabolic panel Order: [id number] Status: Final result      Visible to patient: Yes (seen)      Next appt: 08/05/2022 at 08:10 AM in Cardiology ([facility] Device Remotes)      Dx: CLL (chronic lymphocytic leukemia) ([place])    0 Result Notes             Component Ref Range & Units 2 wk ago (07/09/22) 1 mo ago (06/02/22) 2 mo ago (05/19/22) 2 mo ago (05/02/22) 4 mo ago (03/17/22) 4 mo ago (03/10/22) 4 mo ago (03/04/22)  Sodium 135 - 145 mmol/L 143 136 137 140 140 140 141  Potassium 3.5 - 5.1 mmol/L 3.7 4.3 3.6 CM 3.9 3.7 3.2 Low  3.8  Chloride 98 - 111 mmol/L 111 102 107 107 108 108 106  CO2 22 - 32 mmol/L '25 27 24 28 26 24 26  '$ Glucose, Bld 70 - 99 mg/dL 98 101 High  CM 106 High  CM 115 High  CM 117 High  CM 119 High  CM 107 High  CM  Comment: Glucose reference range applies only to samples taken after fasting for at least 8 hours.  BUN 8 - 23 mg/dL '16 17 23 25 '$ High  '20 22 16  '$ Creatinine, Ser 0.61 - 1.24 mg/dL 0.93 0.85 1.10 1.11 0.85 1.18 0.98  Calcium 8.9 - 10.3 mg/dL 8.4 Low  9.2 8.6  Low  8.7 Low  8.5 Low  8.2 Low  8.7 Low   Total Protein 6.5 - 8.1 g/dL 7.0 7.7  7.6  7.1 7.5  Albumin 3.5 - 5.0 g/dL 3.7 3.9  3.7  3.4 Low  3.8  AST 15 - 41 U/L '23 15  17  18 26  '$ ALT 0 - 44 U/L '26 13  14  17 21  '$ Alkaline  Phosphatase 38 - 126 U/L 82 85  93  86 99  Total Bilirubin 0.3 - 1.2 mg/dL 0.6 0.6  0.9  0.9 0.7  GFR, Estimated >60 mL/min >60 >60 CM >60 CM >60 CM >60 CM >60 CM >60 CM  Comment: (NOTE) Calculated using the CKD-EPI Creatinine Equation (2021)  Anion gap 5 - '15 7 7 '$ CM 6 CM 5 CM 6 CM 8 CM 9 CM  Comment: Performed at [facility], [address] [place], [place] [address]  Resulting Agency  [facility] CLIN LAB [place] CLIN LAB [place] CLIN LAB [place] CLIN [facility] [place] CLIN LAB [place] CL[facility] LAB [place] [facility]IN LAB         Specimen Collected: 07/09/22 13:21 Last Resulted: 07/09/22 13:48

## 2022-08-05 ENCOUNTER — Ambulatory Visit: Payer: No Typology Code available for payment source | Attending: Internal Medicine

## 2022-08-05 DIAGNOSIS — I495 Sick sinus syndrome: Secondary | ICD-10-CM | POA: Diagnosis not present

## 2022-08-06 ENCOUNTER — Telehealth: Payer: Self-pay | Admitting: Family Medicine

## 2022-08-06 LAB — CUP PACEART REMOTE DEVICE CHECK
Battery Remaining Longevity: 44 mo
Battery Voltage: 2.97 V
Brady Statistic AP VP Percent: 1.33 %
Brady Statistic AP VS Percent: 79.01 %
Brady Statistic AS VP Percent: 0.03 %
Brady Statistic AS VS Percent: 19.63 %
Brady Statistic RA Percent Paced: 80.37 %
Brady Statistic RV Percent Paced: 1.47 %
Date Time Interrogation Session: 20240116215718
Implantable Lead Connection Status: 753985
Implantable Lead Connection Status: 753985
Implantable Lead Implant Date: 20170814
Implantable Lead Implant Date: 20170814
Implantable Lead Location: 753859
Implantable Lead Location: 753860
Implantable Lead Model: 5076
Implantable Lead Model: 5076
Implantable Pulse Generator Implant Date: 20170814
Lead Channel Impedance Value: 323 Ohm
Lead Channel Impedance Value: 437 Ohm
Lead Channel Impedance Value: 475 Ohm
Lead Channel Impedance Value: 551 Ohm
Lead Channel Pacing Threshold Amplitude: 0.75 V
Lead Channel Pacing Threshold Amplitude: 0.75 V
Lead Channel Pacing Threshold Pulse Width: 0.4 ms
Lead Channel Pacing Threshold Pulse Width: 0.4 ms
Lead Channel Sensing Intrinsic Amplitude: 18.125 mV
Lead Channel Sensing Intrinsic Amplitude: 18.125 mV
Lead Channel Sensing Intrinsic Amplitude: 2.75 mV
Lead Channel Sensing Intrinsic Amplitude: 2.75 mV
Lead Channel Setting Pacing Amplitude: 2 V
Lead Channel Setting Pacing Amplitude: 2.5 V
Lead Channel Setting Pacing Pulse Width: 0.4 ms
Lead Channel Setting Sensing Sensitivity: 0.9 mV
Zone Setting Status: 755011
Zone Setting Status: 755011

## 2022-08-06 NOTE — Telephone Encounter (Signed)
Left message for patient to call back and schedule Medicare Annual Wellness Visit (AWV) in office.   If not able to come in office, please offer to do virtually or by telephone.  Left office number and my jabber [phone].  Last AWV:08/16/2021   Please schedule at anytime with Nurse Health Advisor.

## 2022-08-21 ENCOUNTER — Telehealth: Payer: Self-pay

## 2022-08-21 ENCOUNTER — Other Ambulatory Visit (HOSPITAL_COMMUNITY): Payer: Self-pay

## 2022-08-21 NOTE — Telephone Encounter (Signed)
Pharmacy Patient Advocate Encounter  Prior Authorization for REPATHA 140 MG/ML INJ has been approved.    Effective dates: 07/21/22 through 07/21/23   Received notification from [place] that prior authorization for REPATHA 140 MG/ML INJ is needed.    PA submitted on 08/21/22 Key [id number] Status is pending  Karie Soda, [place] Patient Advocate Specialist Direct Number: [phone] Fax: [phone]

## 2022-08-22 ENCOUNTER — Telehealth: Payer: Self-pay

## 2022-08-22 NOTE — Telephone Encounter (Signed)
Pharmacy Patient Advocate Encounter  Prior Authorization for REPATHA 140 MG/ML INJ has been approved.    Effective dates: 07/21/22 through 07/21/23   Received notification from [place] that prior authorization for REPATHA 140 MG/ML INJ is needed.    PA submitted on 08/22/22 Key [id number] Status is pending  Karie Soda, [place] Patient Advocate Specialist Direct Number: [phone] Fax: [phone]

## 2022-08-28 ENCOUNTER — Other Ambulatory Visit (HOSPITAL_COMMUNITY): Payer: Self-pay

## 2022-08-28 ENCOUNTER — Other Ambulatory Visit: Payer: Self-pay | Admitting: Pharmacist

## 2022-08-28 ENCOUNTER — Other Ambulatory Visit: Payer: Self-pay

## 2022-08-28 ENCOUNTER — Inpatient Hospital Stay: Payer: Medicare HMO | Attending: Internal Medicine

## 2022-08-28 ENCOUNTER — Inpatient Hospital Stay (HOSPITAL_BASED_OUTPATIENT_CLINIC_OR_DEPARTMENT_OTHER): Payer: Medicare HMO | Admitting: Internal Medicine

## 2022-08-28 ENCOUNTER — Encounter: Payer: Self-pay | Admitting: Internal Medicine

## 2022-08-28 DIAGNOSIS — Z7901 Long term (current) use of anticoagulants: Secondary | ICD-10-CM | POA: Insufficient documentation

## 2022-08-28 DIAGNOSIS — M2550 Pain in unspecified joint: Secondary | ICD-10-CM | POA: Insufficient documentation

## 2022-08-28 DIAGNOSIS — C9110 Chronic lymphocytic leukemia of B-cell type not having achieved remission: Secondary | ICD-10-CM | POA: Insufficient documentation

## 2022-08-28 DIAGNOSIS — D696 Thrombocytopenia, unspecified: Secondary | ICD-10-CM | POA: Diagnosis not present

## 2022-08-28 DIAGNOSIS — D649 Anemia, unspecified: Secondary | ICD-10-CM | POA: Insufficient documentation

## 2022-08-28 DIAGNOSIS — Z79899 Other long term (current) drug therapy: Secondary | ICD-10-CM | POA: Diagnosis not present

## 2022-08-28 DIAGNOSIS — Z87891 Personal history of nicotine dependence: Secondary | ICD-10-CM | POA: Diagnosis not present

## 2022-08-28 DIAGNOSIS — I4891 Unspecified atrial fibrillation: Secondary | ICD-10-CM | POA: Insufficient documentation

## 2022-08-28 DIAGNOSIS — I509 Heart failure, unspecified: Secondary | ICD-10-CM | POA: Diagnosis not present

## 2022-08-28 DIAGNOSIS — Z8673 Personal history of transient ischemic attack (TIA), and cerebral infarction without residual deficits: Secondary | ICD-10-CM | POA: Diagnosis not present

## 2022-08-28 LAB — COMPREHENSIVE METABOLIC PANEL
ALT: 14 U/L (ref 0–44)
AST: 21 U/L (ref 15–41)
Albumin: 3.8 g/dL (ref 3.5–5.0)
Alkaline Phosphatase: 91 U/L (ref 38–126)
Anion gap: 7 (ref 5–15)
BUN: 23 mg/dL (ref 8–23)
CO2: 25 mmol/L (ref 22–32)
Calcium: 8.4 mg/dL — ABNORMAL LOW (ref 8.9–10.3)
Chloride: 107 mmol/L (ref 98–111)
Creatinine, Ser: 1.45 mg/dL — ABNORMAL HIGH (ref 0.61–1.24)
GFR, Estimated: 50 mL/min — ABNORMAL LOW (ref >60)
Glucose, Bld: 116 mg/dL — ABNORMAL HIGH (ref 70–99)
Potassium: 3.9 mmol/L (ref 3.5–5.1)
Sodium: 139 mmol/L (ref 135–145)
Total Bilirubin: 1 mg/dL (ref 0.3–1.2)
Total Protein: 7.3 g/dL (ref 6.5–8.1)

## 2022-08-28 LAB — CBC WITH DIFFERENTIAL/PLATELET
Abs Immature Granulocytes: 0.05 10*3/uL (ref 0.00–0.07)
Basophils Absolute: 0 10*3/uL (ref 0.0–0.1)
Basophils Relative: 1 %
Eosinophils Absolute: 0.3 10*3/uL (ref 0.0–0.5)
Eosinophils Relative: 5 %
HCT: 35.2 % — ABNORMAL LOW (ref 39.0–52.0)
Hemoglobin: 11.5 g/dL — ABNORMAL LOW (ref 13.0–17.0)
Immature Granulocytes: 1 %
Lymphocytes Relative: 34 %
Lymphs Abs: 2.5 10*3/uL (ref 0.7–4.0)
MCH: 33.2 pg (ref 26.0–34.0)
MCHC: 32.7 g/dL (ref 30.0–36.0)
MCV: 101.7 fL — ABNORMAL HIGH (ref 80.0–100.0)
Monocytes Absolute: 1.3 10*3/uL — ABNORMAL HIGH (ref 0.1–1.0)
Monocytes Relative: 18 %
Neutro Abs: 3.1 10*3/uL (ref 1.7–7.7)
Neutrophils Relative %: 41 %
Platelets: 127 10*3/uL — ABNORMAL LOW (ref 150–400)
RBC: 3.46 MIL/uL — ABNORMAL LOW (ref 4.22–5.81)
RDW: 14.6 % (ref 11.5–15.5)
WBC: 7.3 10*3/uL (ref 4.0–10.5)
nRBC: 0 % (ref 0.0–0.2)

## 2022-08-28 MED ORDER — ACALABRUTINIB MALEATE 100 MG PO TABS
100.0000 mg | ORAL_TABLET | Freq: Two times a day (BID) | ORAL | 2 refills | Status: DC
  Filled 2022-08-28 (×2): qty 60, 30d supply, fill #0
  Filled 2022-09-18: qty 60, 30d supply, fill #1
  Filled 2022-10-16: qty 60, 30d supply, fill #2

## 2022-08-28 NOTE — Assessment & Plan Note (Addendum)
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  Patient currently on Acalabrutnib [AUG 16th, 2023]-  Patient admits to compliance; CT scan-  3rd, JAN 2024-  Interval improvement in enlarged lymph nodes in the neck bilaterally; improvement of the lymphadenopathy above and below the diaphragm.  Continue Acalbrutinib 100 mg BID.  Mild  Thrombocytopenia-107 monitor for now.   # will re-stat acalabrutinib 100 mg BID. Informed pharmacy.   # Creatinine- 1.45;  [recent contrast]; GFR- 50;  recommend increased fluids. Will monitor for now.   # Low immunoglobulins IgG 330/recent severe respiratory infection/CLL-s/pI IVIG #4/4 [last April 2022].NOV 2022- IgG > 500. Pending results from today-   # A.fib-on amiodarone [ Dr.Gollan] sinus rhythm-on Eliquis/CHF- lasix 40/d- s/p Cath [AUG, 2022- Echo-55%] -stable.   # MIld Anemia Hb ~11- SEP 2023- iron sat- 17%; ferritin-66- recommend gentle iron/slow iron.   #Hx C.Diff- colitis- s/p Vancomycin PO -monitor closely.hold off antibiotics unless absolutely necessary.   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   # Joint pains/ Muscle pain- continue tramadol/CBD prn; refilled-stable.   # Vaccination: s/p Flu shot.   #Incidental findings on Imaging  CT , 2024: Diverticulosis ; bronchiolitis changes in the lungs ; constipation stool aortic atherosclerosis I reviewed/discussed/counseled the patient.   DISPOSITION: #  follow up in 1 month-  MD; labs- cbc/cmp; LDH; immunoglobulin-- Dr.B

## 2022-08-28 NOTE — Patient Instructions (Signed)
#  Recommend gentle iron 1 pill a day; should not upset your stomach or cause constipation.  Talk to the pharmacist if you can find it/it is over-the-counter.  

## 2022-08-28 NOTE — Progress Notes (Signed)
[facility] OFFICE PROGRESS NOTE  Patient Care Team: Leone Haven, MD as PCP - General (Family Medicine) Rockey Situ Kathlene November, MD as PCP - Cardiology (Cardiology) [name], MD as Consulting Physician (Cardiology) Cammie Sickle, MD as Medical Oncologist (Hematology and Oncology) Gloris Ham, RN as Registered Nurse (Oncology)   Cancer Staging  No matching staging information was found for the patient.   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  MARCH 2017- CT N/C/A/P- NED. STOP Ritux; surveillance   # AUG 2019- CT/PET- progression/NO transformation;  # NOV 2019- Progression; started Ibrutinib 420 mg/d. STOPPED in end of feb sec to extreme fatigue/joint pains/cramps  #November 01, 2018-start ibrutinib 280 mg a day; December 20, 2018-discontinue ibrutinib secondary multiple side effects.   #January 03, 2019 start Gazyva + Ven [July]; finished Dec 2020-; started VLawson Fiscalmaintenance [200 mg a day; DI-amiodarone]; SEP 2022- congestive heart failure; s/p cardiac cath-noted to have CAD however no stents placed.  2D echo- EF-55%  #Mid March 2021-venetoclax dose reduced to 100 mg [second diarrhea]. H[place] x3 months-multiple admissions to the hospital/pneumonia infection stroke x 3 m.   # MAY 2023-progression of disease in the neck ches; Nov 18, 2021-RE-started venetoclax;   # JUNE 29th, 2023-progression of disease in the neck- STOP VENAOCLAX;   # AUG 16th, 2023 START ACALABRUTINIB 100 mg BID [previous intolerance to ibrutinib; delayed sec to C.diff]    #? 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    Ambulating with a cane.   TJEVAUN [id number].[id number].  male CLL-high risk of recurrent/relapsed currently on  alcabrutinib is here for a follow up/and reviewed results of the CT scan.  Pt off his cancer medicine for 1 month as he ran out of medications.   He has new nodules on right and left side of his neck. Nodules are not tender. They have been there about a  month.   No night sweats. No fevers. Pain all over for arthritis.  Appetite is normal. Energy is up and down. Fair to Low.   Denies any further episodes of C. difficile. Complains of chronic joint pain.  Otherwise no fever no chills.  No nausea no vomiting.  No headache. No night sweats.  Review of Systems  Constitutional:  Positive for malaise/fatigue. Negative for chills, diaphoresis and fever.  HENT:  Negative for nosebleeds and sore throat.   Eyes:  Negative for double vision.  Respiratory:  Negative for hemoptysis, shortness of breath and wheezing.   Cardiovascular:  Negative for chest pain, palpitations, orthopnea and leg swelling.  Gastrointestinal:  Positive for diarrhea. Negative for abdominal pain, blood in stool, constipation, heartburn, melena, nausea and vomiting.  Genitourinary:  Negative for dysuria, frequency and urgency.  Musculoskeletal:  Positive for back pain, joint pain and myalgias.  Skin: Negative.  Negative for itching and rash.  Neurological:  Negative for dizziness, tingling, focal weakness and headaches.  Psychiatric/Behavioral:  Negative for depression. The  patient is not nervous/anxious and does not have insomnia.    PAST MEDICAL HISTORY :  Past Medical History:  Diagnosis Date   Anxiety    Arthritis    Atrial fibrillation ([place])    a. Dx 2013, recurred 02/2014, CHA2DS2VASc = 3 -->placed on Eliquis;  b. 02/2014 Echo: EF 50-55%, mid and apical anterior septum and mid and apical inf septum are abnl, mild to mod Ao sclerosis w/o AS.   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 01/2021 LV Gram: EF 35-45%; b. 02/2021 Echo: EF 55-60%, no rwma, GrI DD, mildly reduced RV fxn, triv MR, mild-mod AoV sclerosis w/o stenosis.   Chronic lymphocytic leukemia ([place])    a. Dx 02/2014.   Complication of anesthesia    History of  PTSD--do not touch patient when waking up from surgery.   COPD (chronic obstructive pulmonary disease) (H[facility])    Coronary artery disease    a. 04/2009 CABG x 3 (LIMA->LAD, VG->OM1, VG->PDA);  b. 09/2009 Cath: occluded VG x 2 w/ patent LIMA and L->R collats. EF 55%, mild antlat HK;  c. 01/2021 Cath: LM mild diff dzs, LAD 100p, LCX nl, RCA 50p, 95d, 99 side branch, RPAV fills via LCX. VG->RPDA 100, VG->OM1 100, LIMA->LAD nl. EF 35-45%.   GERD (gastroesophageal reflux disease)    occasional   History of chemotherapy 2015-2016   HOH (hard of hearing)    Bilateral Hearing Aids   Hyperlipidemia LDL goal <70    Hypertension    Ischemic cardiomyopathy    a. 01/2021 LV Gram: EF 35-45%; b. 02/2021 Echo: EF 55-60%.   Myocardial infarction ([place]) 2010   OSA on CPAP    USE C-PAP   PTSD (post-traumatic stress disorder)    Rheumatic fever 1959   SSS (sick sinus syndrome) ([place])    a. 02/2016 s/p MDT MRI compatible DC PPM (ser# [id number]).   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   Stroke [facility])    TIA (transient ischemic attack) 11/02/2015    PAST SURGICAL HISTORY :   Past Surgical History:  Procedure Laterality Date   ABDOMINAL HERNIA REPAIR     APPENDECTOMY   06/21/1985   CARDIAC CATHETERIZATION  2010; 2011   ; Dr Fletcher Anon   CORONARY ARTERY BYPASS GRAFT  04/2009   "CABG X3"   EP IMPLANTABLE DEVICE N/A 03/03/2016   Procedure: Pacemaker Implant;  Surgeon: Deboraha Sprang, MD;  Location: [facility];  Service: Cardiovascular;  Laterality: N/A;   FOREIGN BODY REMOVAL  1968   "shrapnel in my tailbone"   INGUINAL HERNIA REPAIR Right    INSERT / REPLACE / [place] Right 2018   LAPAROSCOPIC CHOLECYSTECTOMY     RIGHT/LEFT HEART CATH AND CORONARY/GRAFT ANGIOGRAPHY N/A 02/04/2021   Procedure: RIGHT/LEFT HEART CATH AND CORONARY/GRAFT ANGIOGRAPHY;  Surgeon: Nelva Bush, MD;  Location: [facility];  Service: Cardiovascular;  Laterality: N/A;   TONSILLECTOMY AND ADENOIDECTOMY  1956   TOTAL HIP ARTHROPLASTY Right 10/22/2016   Procedure: TOTAL HIP ARTHROPLASTY;  Surgeon: Dereck Leep, MD;  Location: [facility];  Service: Orthopedics;  Laterality: Right;   TOTAL HIP ARTHROPLASTY Left 11/04/2017   Procedure: TOTAL HIP ARTHROPLASTY;  Surgeon: Dereck Leep, MD;  Location: [facility];  Service: Orthopedics;  Laterality: Left;    FAMILY HISTORY :   Family History  Problem Relation Age of Onset   Heart disease Mother    Heart attack Mother    Coronary artery disease Other        family history    SOCIAL HISTORY:   Social History   Tobacco Use   Smoking status: Former    Packs/day: 1.00    Years: 40.00    Total pack years: 40.00    Types: Cigarettes    Quit date: 07/21/2006    Years since quitting: 16.1   Smokeless tobacco: Never  Vaping Use   Vaping Use: Former  Substance Use Topics   Alcohol use: Not Currently   Drug use: No    ALLERGIES:  is allergic to augmentin [amoxicillin-pot clavulanate].  MEDICATIONS:  Current Outpatient Medications  Medication Sig Dispense Refill   acetaminophen (TYLENOL) 325 MG tablet Take 1-2 tablets (325-650 mg total) by mouth every 4 (four) hours as needed for mild pain.      acyclovir (ZOVIRAX) 400 MG tablet TAKE 1 TABLET(400 MG) BY MOUTH TWICE DAILY 60 tablet 6   albuterol (VENTOLIN HFA) 108 (90 Base) MCG/ACT inhaler Inhale 2 puffs into the lungs every 6 (six) hours as needed for wheezing or shortness of breath. 1 each 0   Alirocumab (PRALUENT) 150 MG/ML SOAJ INJECT 150 MG UNDER THE SKIN EVERY 14 DAYS 2 mL 2   amiodarone (PACERONE) 200 MG tablet Take 1/2 tablet (100 mg) by mouth twice daily 5 days a week 60 tablet 6   apixaban (ELIQUIS) 5 MG TABS tablet Take 5 mg by mouth 2 (two) times daily.     azelastine (ASTELIN) 0.1 % nasal spray Place 2 sprays into both nostrils 2 (two) times daily. Use in each nostril as directed 30 mL 1   budesonide-formoterol (SYMBICORT) 160-4.5 MCG/ACT inhaler Inhale 2 puffs into the lungs 2 (two) times daily. 1 Inhaler 11   cetirizine (ZYRTEC) 10 MG tablet Take 10 mg by mouth daily as needed for allergies.      Coenzyme Q10 (COQ10) 200 MG CAPS Take 200 mg by mouth daily.     ezetimibe (ZETIA) 10 MG tablet Take 1 tablet (10 mg total) by mouth daily. 90 tablet 3   furosemide (LASIX) 20 MG tablet Take 1 tablet (20 mg total) by mouth daily. (Patient taking differently: Take 10 mg by mouth daily.) 90 tablet 1   gabapentin (NEURONTIN) 300 MG capsule Take by mouth.     Krill Oil 350 MG CAPS Take 350 mg by mouth daily as needed (Heart).     mirtazapine (REMERON) 15 MG tablet Take 15 mg by mouth at bedtime as needed (for panic associated with PTSD).      Multiple Vitamin (MULTIVITAMIN WITH MINERALS) TABS tablet Take 1 tablet by mouth daily.     nitroGLYCERIN (NITROSTAT) 0.4 MG SL tablet Place 1 tablet (0.4 mg total) under the tongue every 5 (five) minutes as needed for chest pain. 25 tablet 6   Tiotropium Bromide Monohydrate 2.5 MCG/ACT AERS Inhale 2 puffs into the lungs at bedtime. Spiriva     traMADol (ULTRAM) 50 MG tablet Take 1 tablet (50 mg total) by mouth every 12 (twelve) hours as needed. 60 tablet 0   acalabrutinib maleate (CALQUENCE)  100 MG tablet Take 1 tablet (100  mg total) by mouth 2 (two) times daily. 60 tablet 2   No current facility-administered medications for this visit.    PHYSICAL EXAMINATION: ECOG PERFORMANCE STATUS: 1 - Symptomatic but completely ambulatory  BP 135/77 (BP Location: Left Arm, Patient Position: Sitting)   Pulse 63   Temp 97.6 F (36.4 C)   Resp 18   Wt 196 lb (88.9 kg)   SpO2 98%   BMI 28.94 kg/m   Filed Weights   08/28/22 1010  Weight: 196 lb (88.9 kg)   Vague swelling noted in the right submandibular area- left neck ~2.5 cm.  Nontender. BIL axillary LN  Physical Exam HENT:     Head: Normocephalic and atraumatic.     Mouth/Throat:     Pharynx: No oropharyngeal exudate.  Eyes:     Pupils: Pupils are equal, round, and reactive to light.  Cardiovascular:     Rate and Rhythm: Normal rate and regular rhythm.  Pulmonary:     Effort: No respiratory distress.     Breath sounds: No wheezing.  Abdominal:     General: Bowel sounds are normal. There is no distension.     Palpations: Abdomen is soft. There is no mass.     Tenderness: There is no abdominal tenderness. There is no guarding or rebound.  Musculoskeletal:        General: No tenderness. Normal range of motion.     Cervical back: Normal range of motion and neck supple.  Skin:    General: Skin is warm.  Neurological:     Mental Status: He is alert and oriented to person, place, and time.  Psychiatric:        Mood and Affect: Affect normal.     LABORATORY DATA:  I have reviewed the data as listed    Component Value Date/Time   NA 139 08/28/2022 0930   NA 143 03/11/2021 1136   NA 139 10/11/2014 1800   K 3.9 08/28/2022 0930   K 3.3 (L) 10/11/2014 1800   CL 107 08/28/2022 0930   CL 106 10/11/2014 1800   CO2 25 08/28/2022 0930   CO2 27 10/11/2014 1800   GLUCOSE 116 (H) 08/28/2022 0930   GLUCOSE 107 (H) 10/11/2014 1800   BUN 23 08/28/2022 0930   BUN 13 03/11/2021 1136   BUN 15 10/11/2014 1800   CREATININE  1.45 (H) 08/28/2022 0930   CREATININE 0.89 10/11/2014 1800   CALCIUM 8.4 (L) 08/28/2022 0930   CALCIUM 8.8 (L) 10/11/2014 1800   PROT 7.3 08/28/2022 0930   PROT 6.7 05/18/2017 1048   PROT 6.4 (L) 10/11/2014 1800   ALBUMIN 3.8 08/28/2022 0930   ALBUMIN 4.3 05/18/2017 1048   ALBUMIN 4.1 10/11/2014 1800   AST 21 08/28/2022 0930   AST 23 10/11/2014 1800   ALT 14 08/28/2022 0930   ALT 22 10/11/2014 1800   ALKPHOS 91 08/28/2022 0930   ALKPHOS 61 10/11/2014 1800   BILITOT 1.0 08/28/2022 0930   BILITOT 0.7 05/18/2017 1048   BILITOT 0.9 10/11/2014 1800   GFRNONAA 50 (L) 08/28/2022 0930   GFRNONAA >60 10/11/2014 1800   GFRAA >60 04/12/2020 0959   GFRAA >60 10/11/2014 1800    No results found for: "SPEP", "UPEP"  Lab Results  Component Value Date   WBC 7.3 08/28/2022   NEUTROABS 3.1 08/28/2022   HGB 11.5 (L) 08/28/2022   HCT 35.2 (L) 08/28/2022   MCV 101.7 (H) 08/28/2022   PLT 127 (L) 08/28/2022      Chemistry  Component Value Date/Time   NA 139 08/28/2022 0930   NA 143 03/11/2021 1136   NA 139 10/11/2014 1800   K 3.9 08/28/2022 0930   K 3.3 (L) 10/11/2014 1800   CL 107 08/28/2022 0930   CL 106 10/11/2014 1800   CO2 25 08/28/2022 0930   CO2 27 10/11/2014 1800   BUN 23 08/28/2022 0930   BUN 13 03/11/2021 1136   BUN 15 10/11/2014 1800   CREATININE 1.45 (H) 08/28/2022 0930   CREATININE 0.89 10/11/2014 1800      Component Value Date/Time   CALCIUM 8.4 (L) 08/28/2022 0930   CALCIUM 8.8 (L) 10/11/2014 1800   ALKPHOS 91 08/28/2022 0930   ALKPHOS 61 10/11/2014 1800   AST 21 08/28/2022 0930   AST 23 10/11/2014 1800   ALT 14 08/28/2022 0930   ALT 22 10/11/2014 1800   BILITOT 1.0 08/28/2022 0930   BILITOT 0.7 05/18/2017 1048   BILITOT 0.9 10/11/2014 1800       RADIOGRAPHIC STUDIES: I have personally reviewed the radiological images as listed and agreed with the findings in the report. No results found.   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  Patient currently on Acalabrutnib [AUG 16th, 2023]-  Patient admits to compliance; CT scan-  3rd, JAN 2024-  Interval improvement in enlarged lymph nodes in the neck bilaterally; improvement of the lymphadenopathy above and below the diaphragm.  Continue Acalbrutinib 100 mg BID.  Mild  Thrombocytopenia-107 monitor for now.   # will re-stat acalabrutinib 100 mg BID.   # Creatinine- 1.45;  [recent contrast]; GFR- 50;  recommend increased fluids. Will monitor for now.   # Low immunoglobulins IgG 330/recent severe respiratory infection/CLL-s/pI IVIG #4/4 [last April 2022].NOV 2022- IgG > 500. Pending results from today-   # A.fib-on amiodarone [ Dr.Gollan] sinus rhythm-on Eliquis/CHF- lasix 40/d- s/p Cath [AUG, 2022- Echo-55%] -stable.   # MIld Anemia Hb ~11- SEP 2023- iron sat- 17%; ferritin-66- recommend gentle iron/slow iron.   #Hx C.Diff- colitis- s/p Vancomycin PO -monitor closely.hold off antibiotics unless absolutely necessary.   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   # Joint pains/ Muscle pain- continue tramadol/CBD prn; refilled-stable.   # Vaccination: s/p Flu shot.   #Incidental findings on Imaging  CT , 2024: Diverticulosis ; bronchiolitis changes in the lungs ; constipation stool aortic atherosclerosis I reviewed/discussed/counseled the patient.    DISPOSITION: #  follow up in 1 month-  MD; labs- cbc/cmp; LDH; immunoglobulin-- Dr.B   Orders Placed This Encounter  Procedures   Immunoglobulins, QN, A/E/G/M    Standing Status:   Future    Standing Expiration Date:   08/29/2023   CBC with Differential/Platelet    Standing Status:   Future    Standing Expiration Date:   08/29/2023   Comprehensive metabolic panel    Standing Status:   Future    Standing Expiration Date:   08/29/2023   Lactate dehydrogenase    Standing Status:   Future    Standing Expiration Date:   08/29/2023   All questions were answered.  The patient knows to call the clinic with any problems, questions or concerns.     Cammie Sickle, MD 08/28/2022 11:35 AM

## 2022-08-28 NOTE — Progress Notes (Signed)
Pt off his cancer medicine. He has new nodules on right and left side of his neck. Nodules are not tender. They have been there about a  month.  No night sweats. No fevers. Pain all over for arthritis.  Appetite is normal. Energy is up and down. Fair to Low.

## 2022-08-29 NOTE — Progress Notes (Signed)
Remote pacemaker transmission.   

## 2022-09-06 ENCOUNTER — Other Ambulatory Visit: Payer: Self-pay | Admitting: Cardiovascular Disease

## 2022-09-12 ENCOUNTER — Ambulatory Visit (INDEPENDENT_AMBULATORY_CARE_PROVIDER_SITE_OTHER): Payer: Medicare HMO

## 2022-09-12 VITALS — Ht 69.0 in | Wt 188.0 lb

## 2022-09-12 DIAGNOSIS — Z0000 Encounter for general adult medical examination without abnormal findings: Secondary | ICD-10-CM

## 2022-09-12 NOTE — Patient Instructions (Addendum)
[name] , Thank you for taking time to come for your Medicare Wellness Visit. I appreciate your ongoing commitment to your health goals. Please review the following plan we discussed and let me know if I can assist you in the future.   These are the goals we discussed:  Goals      Maintain Healthy Lifestyle     Stay hydrated Healthy diet Stay active walking [place] the farm and around the house        This is a list of the screening recommended for you and due dates:  Health Maintenance  Topic Date Due   COVID-19 Vaccine (7 - 2023-24 season) 10/20/2022   Screening for Lung Cancer  07/24/2023   Medicare Annual Wellness Visit  09/13/2023   DTaP/Tdap/Td vaccine (4 - Td or Tdap) 05/08/2031   Pneumonia Vaccine  Completed   Flu Shot  Completed   Hepatitis C Screening: USPSTF Recommendation to screen - Ages 18-79 yo.  Completed   Zoster (Shingles) Vaccine  Completed   HPV Vaccine  Aged Out   Colon Cancer Screening  Discontinued    Advanced directives: End of life planning; Advance aging; Advanced directives discussed.  Copy of current HCPOA/Living Will requested.    Conditions/risks identified: none new  Next appointment: Follow up in one year for your annual wellness visit.   Preventive Care 17 Years and Older, Male  Preventive care refers to lifestyle choices and visits with your health care provider that can promote health and wellness. What does preventive care include? A yearly physical exam. This is also called an annual well check. Dental exams once or twice a year. Routine eye exams. Ask your health care provider how often you should have your eyes checked. Personal lifestyle choices, including: Daily care of your teeth and gums. Regular physical activity. Eating a healthy diet. Avoiding tobacco and drug use. Limiting alcohol use. Practicing safe sex. Taking low doses of aspirin every day. Taking vitamin and mineral supplements as recommended by your health care  provider. What happens during an annual well check? The services and screenings done by your health care provider during your annual well check will depend on your age, overall health, lifestyle risk factors, and family history of disease. Counseling  Your health care provider may ask you questions about your: Alcohol use. Tobacco use. Drug use. Emotional well-being. Home and relationship well-being. Sexual activity. Eating habits. History of falls. Memory and ability to understand (cognition). Work and work Statistician. Screening  You may have the following tests or measurements: Height, weight, and BMI. Blood pressure. Lipid and cholesterol levels. These may be checked every 5 years, or more frequently if you are over 39 years old. Skin check. Lung cancer screening. You may have this screening every year starting at age 12 if you have a 30-pack-year history of smoking and currently smoke or have quit within the past 15 years. Fecal occult blood test (FOBT) of the stool. You may have this test every year starting at age 58. Flexible sigmoidoscopy or colonoscopy. You may have a sigmoidoscopy every 5 years or a colonoscopy every 10 years starting at age 52. Prostate cancer screening. Recommendations will vary depending on your family history and other risks. Hepatitis C blood test. Hepatitis B blood test. Sexually transmitted disease (STD) testing. Diabetes screening. This is done by checking your blood sugar (glucose) after you have not eaten for a while (fasting). You may have this done every 1-3 years. Abdominal aortic aneurysm (AAA) screening. You  may need this if you are a current or former smoker. Osteoporosis. You may be screened starting at age 59 if you are at high risk. Talk with your health care provider about your test results, treatment options, and if necessary, the need for more tests. Vaccines  Your health care provider may recommend certain vaccines, such  as: Influenza vaccine. This is recommended every year. Tetanus, diphtheria, and acellular pertussis (Tdap, Td) vaccine. You may need a Td booster every 10 years. Zoster vaccine. You may need this after age 60. Pneumococcal 13-valent conjugate (PCV13) vaccine. One dose is recommended after age 96. Pneumococcal polysaccharide (PPSV23) vaccine. One dose is recommended after age 53. Talk to your health care provider about which screenings and vaccines you need and how often you need them. This information is not intended to replace advice given to you by your health care provider. Make sure you discuss any questions you have with your health care provider. Document Released: 08/03/2015 Document Revised: 03/26/2016 Document Reviewed: 05/08/2015 Elsevier Interactive Patient Education  2017 [place] Prevention in the Home Falls can cause injuries. They can happen to people of all ages. There are many things you can do to make your home safe and to help prevent falls. What can I do on the outside of my home? Regularly fix the edges of walkways and driveways and fix any cracks. Remove anything that might make you trip as you walk through a door, such as a raised step or threshold. Trim any bushes or trees on the path to your home. Use bright outdoor lighting. Clear any walking paths of anything that might make someone trip, such as rocks or tools. Regularly check to see if handrails are loose or broken. Make sure that both sides of any steps have handrails. Any raised decks and porches should have guardrails on the edges. Have any leaves, snow, or ice cleared regularly. Use sand or salt on walking paths during winter. Clean up any spills in your garage right away. This includes oil or grease spills. What can I do in the bathroom? Use night lights. Install grab bars by the toilet and in the tub and shower. Do not use towel bars as grab bars. Use non-skid mats or decals in the tub or  shower. If you need to sit down in the shower, use a plastic, non-slip stool. Keep the floor dry. Clean up any water that spills on the floor as soon as it happens. Remove soap buildup in the tub or shower regularly. Attach bath mats securely with double-sided non-slip rug tape. Do not have throw rugs and other things on the floor that can make you trip. What can I do in the bedroom? Use night lights. Make sure that you have a light by your bed that is easy to reach. Do not use any sheets or blankets that are too big for your bed. They should not hang down onto the floor. Have a firm chair that has side arms. You can use this for support while you get dressed. Do not have throw rugs and other things on the floor that can make you trip. What can I do in the kitchen? Clean up any spills right away. Avoid walking on wet floors. Keep items that you use a lot in easy-to-reach places. If you need to reach something above you, use a strong step stool that has a grab bar. Keep electrical cords out of the way. Do not use floor polish or wax that  makes floors slippery. If you must use wax, use non-skid floor wax. Do not have throw rugs and other things on the floor that can make you trip. What can I do with my stairs? Do not leave any items on the stairs. Make sure that there are handrails on both sides of the stairs and use them. Fix handrails that are broken or loose. Make sure that handrails are as long as the stairways. Check any carpeting to make sure that it is firmly attached to the stairs. Fix any carpet that is loose or worn. Avoid having throw rugs at the top or bottom of the stairs. If you do have throw rugs, attach them to the floor with carpet tape. Make sure that you have a light switch at the top of the stairs and the bottom of the stairs. If you do not have them, ask someone to add them for you. What else can I do to help prevent falls? Wear shoes that: Do not have high heels. Have  rubber bottoms. Are comfortable and fit you well. Are closed at the toe. Do not wear sandals. If you use a stepladder: Make sure that it is fully opened. Do not climb a closed stepladder. Make sure that both sides of the stepladder are locked into place. Ask someone to hold it for you, if possible. Clearly mark and make sure that you can see: Any grab bars or handrails. First and last steps. Where the edge of each step is. Use tools that help you move around (mobility aids) if they are needed. These include: Canes. Walkers. Scooters. Crutches. Turn on the lights when you go into a dark area. Replace any light bulbs as soon as they burn out. Set up your furniture so you have a clear path. Avoid moving your furniture around. If any of your floors are uneven, fix them. If there are any pets around you, be aware of where they are. Review your medicines with your doctor. Some medicines can make you feel dizzy. This can increase your chance of falling. Ask your doctor what other things that you can do to help prevent falls. This information is not intended to replace advice given to you by your health care provider. Make sure you discuss any questions you have with your health care provider. Document Released: 05/03/2009 Document Revised: 12/13/2015 Document Reviewed: 08/11/2014 Elsevier Interactive Patient Education  2017 [employer].

## 2022-09-12 NOTE — Progress Notes (Signed)
Subjective:   [name] is a 77 y.o. male who presents for Medicare Annual (Subsequent) preventive examination.  Review of Systems    No ROS.  Medicare Wellness Virtual Visit.  Visual/audio telehealth visit, UTA vital signs.   See social history for additional risk factors.   Cardiac Risk Factors include: advanced age (>30mn, >>18women);male gender;hypertension     Objective:    Today's Vitals   09/12/22 0941  Weight: 188 lb (85.3 kg)  Height: '5\' 9"'$  (1.753 m)   Body mass index is 27.76 kg/m.     09/12/2022    9:48 AM 08/28/2022    9:47 AM 07/09/2022    1:33 PM 06/02/2022    1:11 PM 05/02/2022    8:31 AM 03/04/2022    2:46 PM 01/27/2022   11:47 PM  Advanced Directives  Does Patient Have a Medical Advance Directive? Yes No Yes Yes Yes Yes Yes  Type of [place] will   [place] will  [place] Does patient want to make changes to medical advance directive? No - Patient declined No - Patient declined  No - Patient declined   No - Patient declined  Copy of [place] Chart? No - copy requested   No - copy requested   No - copy requested    Current Medications (verified) Outpatient Encounter Medications as of 09/12/2022  Medication Sig   acalabrutinib maleate (CALQUENCE) 100 MG tablet Take 1 tablet (100 mg total) by mouth 2 (two) times daily.   acetaminophen (TYLENOL) 325 MG tablet Take 1-2 tablets (325-650 mg total) by mouth every 4 (four) hours as needed for mild pain.   acyclovir (ZOVIRAX) 400 MG tablet TAKE 1 TABLET(400 MG) BY MOUTH TWICE DAILY   albuterol (VENTOLIN HFA) 108 (90 Base) MCG/ACT inhaler Inhale 2 puffs into the lungs every 6 (six) hours as needed for wheezing or shortness of breath.   Alirocumab (PRALUENT) 150 MG/ML SOAJ ADMINISTER 150 MG UNDER THE SKIN EVERY 14 DAYS   amiodarone (PACERONE) 200 MG tablet  Take 1/2 tablet (100 mg) by mouth twice daily 5 days a week   apixaban (ELIQUIS) 5 MG TABS tablet Take 5 mg by mouth 2 (two) times daily.   azelastine (ASTELIN) 0.1 % nasal spray Place 2 sprays into both nostrils 2 (two) times daily. Use in each nostril as directed   budesonide-formoterol (SYMBICORT) 160-4.5 MCG/ACT inhaler Inhale 2 puffs into the lungs 2 (two) times daily.   cetirizine (ZYRTEC) 10 MG tablet Take 10 mg by mouth daily as needed for allergies.    Coenzyme Q10 (COQ10) 200 MG CAPS Take 200 mg by mouth daily.   ezetimibe (ZETIA) 10 MG tablet Take 1 tablet (10 mg total) by mouth daily.   furosemide (LASIX) 20 MG tablet Take 1 tablet (20 mg total) by mouth daily. (Patient taking differently: Take 10 mg by mouth daily.)   gabapentin (NEURONTIN) 300 MG capsule Take by mouth.   Krill Oil 350 MG CAPS Take 350 mg by mouth daily as needed (Heart).   mirtazapine (REMERON) 15 MG tablet Take 15 mg by mouth at bedtime as needed (for panic associated with PTSD).    Multiple Vitamin (MULTIVITAMIN WITH MINERALS) TABS tablet Take 1 tablet by mouth daily.   nitroGLYCERIN (NITROSTAT) 0.4 MG SL tablet Place 1 tablet (0.4 mg total) under the tongue every 5 (five) minutes as needed for chest pain.  Tiotropium Bromide Monohydrate 2.5 MCG/ACT AERS Inhale 2 puffs into the lungs at bedtime. Spiriva   traMADol (ULTRAM) 50 MG tablet Take 1 tablet (50 mg total) by mouth every 12 (twelve) hours as needed.   No facility-administered encounter medications on file as of 09/12/2022.    Allergies (verified) Augmentin [amoxicillin-pot clavulanate]   History: Past Medical History:  Diagnosis Date   Anxiety    Arthritis    Atrial fibrillation ([place])    a. Dx 2013, recurred 02/2014, CHA2DS2VASc = 3 -->placed on Eliquis;  b. 02/2014 Echo: EF 50-55%, mid and apical anterior septum and mid and apical inf septum are abnl, mild to mod Ao sclerosis w/o AS.   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 01/2021 LV Gram: EF 35-45%; b. 02/2021 Echo: EF 55-60%, no rwma, GrI DD, mildly reduced RV fxn, triv MR, mild-mod AoV sclerosis w/o stenosis.   Chronic lymphocytic leukemia ([place])    a. Dx 02/2014.   Complication of anesthesia    History of  PTSD--do not touch patient when waking up from surgery.   COPD (chronic obstructive pulmonary disease) (HCC)    Coronary artery disease    a. 04/2009 CABG x 3 (LIMA->LAD, VG->OM1, VG->PDA);  b. 09/2009 Cath: occluded VG x 2 w/ patent LIMA and L->R collats. EF 55%, mild antlat HK;  c. 01/2021 Cath: LM mild diff dzs, LAD 100p, LCX nl, RCA 50p, 95d, 99 side branch, RPAV fills via LCX. VG->RPDA 100, VG->OM1 100, LIMA->LAD nl. EF 35-45%.   GERD (gastroesophageal reflux disease)    occasional   History of chemotherapy 2015-2016   HOH (hard of hearing)    Bilateral Hearing Aids   Hyperlipidemia LDL goal <70    Hypertension    Ischemic cardiomyopathy    a. 01/2021 LV Gram: EF 35-45%; b. 02/2021 Echo: EF 55-60%.   Myocardial infarction ([place]) 2010   OSA on CPAP    USE C-PAP   PTSD (post-traumatic stress disorder)    Rheumatic fever 1959   SSS (sick sinus syndrome) ([place])    a. 02/2016 s/p MDT MRI compatible DC PPM (ser# [id number]).   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   Stroke [facility])    TIA (transient ischemic attack) 11/02/2015   Past Surgical History:  Procedure Laterality Date   ABDOMINAL HERNIA REPAIR     APPENDECTOMY  06/21/1985   CARDIAC CATHETERIZATION  2010; 2011   ; Dr Fletcher Anon   CORONARY ARTERY BYPASS GRAFT  04/2009   "CABG X3"   EP IMPLANTABLE DEVICE N/A 03/03/2016   Procedure: Pacemaker Implant;  Surgeon: Deboraha Sprang, MD;  Location: [place] [facility];  Service: Cardiovascular;  Laterality: N/A;   FOREIGN BODY REMOVAL  1968   "shrapnel in my tailbone"   INGUINAL HERNIA REPAIR Right    INSERT / REPLACE / [place] Right 2018   LAPAROSCOPIC  CHOLECYSTECTOMY     RIGHT/LEFT HEART CATH AND CORONARY/GRAFT ANGIOGRAPHY N/A 02/04/2021   Procedure: RIGHT/LEFT HEART CATH AND CORONARY/GRAFT ANGIOGRAPHY;  Surgeon: Nelva Bush, MD;  Location: [place] [facility];  Service: Cardiovascular;  Laterality: N/A;   TONSILLECTOMY AND ADENOIDECTOMY  1956   TOTAL HIP ARTHROPLASTY Right 10/22/2016   Procedure: TOTAL HIP ARTHROPLASTY;  Surgeon: Dereck Leep, MD;  Location: [facility];  Service: Orthopedics;  Laterality: Right;   TOTAL HIP ARTHROPLASTY Left 11/04/2017  Procedure: TOTAL HIP ARTHROPLASTY;  Surgeon: Dereck Leep, MD;  Location: [facility];  Service: Orthopedics;  Laterality: Left;   Family History  Problem Relation Age of Onset   Heart disease Mother    Heart attack Mother    Coronary artery disease Other        family history   Social History   Socioeconomic History   Marital status: Married    Spouse name: Not on file   Number of children: Not on file   Years of education: Not on file   Highest education level: Not on file  Occupational History   Occupation: retired    Fish farm manager: [employer]    Comment: Education officer, [employer]  Tobacco Use   Smoking status: Former    Packs/day: 1.00    Years: 40.00    Total pack years: 40.00    Types: Cigarettes    Quit date: 07/21/2006    Years since quitting: 16.1   Smokeless tobacco: Never  Vaping Use   Vaping Use: Former  Substance and Sexual Activity   Alcohol use: Not Currently   Drug use: No   Sexual activity: Not Currently  Other Topics Concern   Not on file  Social History Narrative   Lives in [place] with wife. Dogs. Goats. Children - 4. Grandchildren - 7.      Worked - Sports coach, part time.   Diet - healthy   Exercise - very active      Service - [employer] - [place]   Social Determinants of Health   Financial Resource Strain: Low Risk  (09/12/2022)   Overall Financial Resource Strain (CARDIA)    Difficulty of Paying Living Expenses: Not hard at all   Food Insecurity: No Food Insecurity (09/12/2022)   Hunger Vital Sign    Worried About Running Out of Food in the Last Year: Never true    Ran Out of Food in the Last Year: Never true  Transportation Needs: No Transportation Needs (09/12/2022)   PRAPARE - Hydrologist (Medical): No    Lack of Transportation (Non-Medical): No  Physical Activity: Unknown (09/12/2022)   Exercise Vital Sign    Days of Exercise per Week: 5 days    Minutes of Exercise per Session: Not on file  Stress: No Stress Concern Present (09/12/2022)   [place]    Feeling of Stress : Not at all  Social Connections: Unknown (09/12/2022)   Social Connection and Isolation Panel [NHANES]    Frequency of Communication with Friends and Family: More than three times a week    Frequency of Social Gatherings with Friends and Family: More than three times a week    Attends Religious Services: Not on Advertising copywriter or Organizations: Not on file    Attends Archivist Meetings: Not on file    Marital Status: Married    Tobacco Counseling Counseling given: Not Answered   Clinical Intake:  Pre-visit preparation completed: Yes        Diabetes: No  How often do you need to have someone help you when you read instructions, pamphlets, or other written materials from your doctor or pharmacy?: 1 - Never    Interpreter Needed?: No     Activities of Daily Living    09/12/2022    9:50 AM 01/27/2022   11:47 PM  In your present state of health, do you have any  difficulty performing the following activities:  Hearing? 1 1  Comment Hearing aids   Vision? 0 0  Difficulty concentrating or making decisions? 0 0  Walking or climbing stairs? 1 0  Comment Cane/walker/scooter in use   Dressing or bathing? 0 0  Doing errands, shopping? 0 0  Preparing Food and eating ? N   Using the Toilet? N   In the past six  months, have you accidently leaked urine? N   Do you have problems with loss of bowel control? N   Managing your Medications? N   Managing your Finances? N   Housekeeping or managing your Housekeeping? N     Patient Care Team: Leone Haven, MD as PCP - General (Family Medicine) Rockey Situ Kathlene November, MD as PCP - Cardiology (Cardiology) [name], MD as Consulting Physician (Cardiology) Cammie Sickle, MD as Medical Oncologist (Hematology and Oncology) Gloris Ham, RN as Registered Nurse (Oncology)  Indicate any recent Medical Services you may have received from other than [facility] providers in the past year (date may be approximate).     Assessment:   This is a routine wellness examination for [facility].  I connected with  [name] on 09/12/22 by a audio enabled telemedicine application and verified that I am speaking with the correct person using two identifiers.  Patient Location: Home  Provider Location: Office/Clinic  I discussed the limitations of evaluation and management by telemedicine. The patient expressed understanding and agreed to proceed.   Hearing/Vision screen Hearing Screening - Comments:: Hearing aids Vision Screening - Comments:: Followed by [facility]  Wears corrective lenses They have regular follow up with the ophthalmologist   Dietary issues and exercise activities discussed: Current Exercise Habits: Home exercise routine, Type of exercise: walking, Time (Minutes): 30, Frequency (Times/Week): 5, Weekly Exercise (Minutes/Week): 150, Intensity: Mild   Goals Addressed             This Visit's Progress    Maintain Healthy Lifestyle   On track    Stay hydrated Healthy diet Stay active walking on the farm and around the house       Depression Screen    09/12/2022    9:46 AM 07/08/2022    9:07 AM 04/17/2022    1:46 PM 04/07/2022    9:26 AM 01/30/2022   10:17 AM 01/03/2022    1:41 PM 12/26/2021   11:12 AM  PHQ 2/9 Scores   PHQ - 2 Score 0 0 0 0 0 1 0  PHQ- 9 Score  0         Fall Risk    09/12/2022    9:49 AM 04/17/2022    1:46 PM 04/15/2022   11:23 AM 04/07/2022    9:26 AM 01/03/2022    1:41 PM  Fall Risk   Falls in the past year? 1 1 0 0 1  Number falls in past yr: 0 1  0 1  Injury with Fall?  0  0 0  Risk for fall due to : Impaired balance/gait History of fall(s) No Fall Risks No Fall Risks History of fall(s)  Risk for fall due to: Comment Cane,walker,scooter      Follow up Falls evaluation completed;Falls prevention discussed Falls evaluation completed Falls evaluation completed Falls evaluation completed Falls evaluation completed    FALL RISK PREVENTION PERTAINING TO THE HOME: Home free of loose throw rugs in walkways, pet beds, electrical cords, etc? Yes  Adequate lighting in your home to reduce risk of  falls? Yes   ASSISTIVE DEVICES UTILIZED TO PREVENT FALLS: Life alert? No  Phone on person? Yes Use of a cane, walker or w/c? Yes  Grab bars in the bathroom? No  Shower chair or bench in shower? No  Elevated toilet seat or a handicapped toilet? No   TIMED UP AND GO: Was the test performed? No .   Cognitive Function:        09/12/2022    9:51 AM 05/12/2019   10:50 AM 03/19/2018    8:50 AM  6CIT Screen  What Year? 0 points 0 points 0 points  What month? 0 points 0 points 0 points  What time? 0 points 0 points 0 points  Count back from 20 0 points 0 points 0 points  Months in reverse 0 points 0 points 0 points  Repeat phrase 0 points 0 points 0 points  Total Score 0 points 0 points 0 points    Immunizations Immunization History  Administered Date(s) Administered   COVID-19, mRNA, vaccine(Comirnaty)12 years and older 08/25/2022   Fluad Quad(high Dose 65+) 04/04/2020, 05/02/2021, 04/07/2022   Hep B, Unspecified 01/09/2003, 05/16/2003   Hepatitis B, ADULT 03/14/2004   Influenza Split 05/21/2012   Influenza, High Dose Seasonal PF 04/21/2018   Influenza, Seasonal, Injecte,  Preservative Fre 04/23/2011, 03/31/2012, 04/06/2013   Influenza,inj,Quad PF,6+ Mos 03/16/2014, 06/20/2015, 04/27/2016, 04/17/2017   Influenza-Unspecified 08/06/2015, 05/12/2016, 04/15/2018   PFIZER(Purple Top)SARS-COV-2 Vaccination 08/22/2019, 09/15/2019, 10/11/2019, 04/17/2020   Pfizer Covid-19 Vaccine Bivalent Booster 25yr & up 05/17/2021   Pneumococcal Conjugate-13 04/07/2014, 07/05/2015   Pneumococcal Polysaccharide-23 07/21/2008, 01/10/2009, 01/07/2011, 04/21/2015, 08/06/2015   Tdap 03/28/2013, 01/14/2018, 05/07/2021   Zoster Recombinat (Shingrix) 02/24/2022, 08/25/2022   Screening Tests Health Maintenance  Topic Date Due   COVID-19 Vaccine (7 - 2023-24 season) 10/20/2022   Lung Cancer Screening  07/24/2023   Medicare Annual Wellness (AWV)  09/13/2023   DTaP/Tdap/Td (4 - Td or Tdap) 05/08/2031   Pneumonia Vaccine 77 Years old  Completed   INFLUENZA VACCINE  Completed   Hepatitis C Screening  Completed   Zoster Vaccines- Shingrix  Completed   HPV VACCINES  Aged Out   COLONOSCOPY (Pts [phone] coverage will need to be confirmed)  Discontinued   Health Maintenance There are no preventive care reminders to display for this patient.  DG Chest 2 View: 05/19/22   Hepatitis C Screening: Completed 04/2018.  Vision Screening: Recommended annual ophthalmology exams for early detection of glaucoma and other disorders of the eye.  Dental Screening: Recommended annual dental exams for proper oral hygiene  Community Resource Referral / Chronic Care Management: CRR required this visit?  No   CCM required this visit?  No      Plan:     I have personally reviewed and noted the following in the patient's chart:   Medical and social history Use of alcohol, tobacco or illicit drugs  Current medications and supplements including opioid prescriptions. Patient is currently taking opioid prescriptions. Information provided to patient regarding non-opioid alternatives. Patient  advised to discuss non-opioid treatment plan with their provider. Functional ability and status Nutritional status Physical activity Advanced directives List of other physicians Hospitalizations, surgeries, and ER visits in previous 12 months Vitals Screenings to include cognitive, depression, and falls Referrals and appointments  In addition, I have reviewed and discussed with patient certain preventive protocols, quality metrics, and best practice recommendations. A written personalized care plan for preventive services as well as general preventive health recommendations were provided to patient.  Leta Jungling, LPN   [id number]

## 2022-09-18 ENCOUNTER — Other Ambulatory Visit (HOSPITAL_COMMUNITY): Payer: Self-pay

## 2022-09-22 ENCOUNTER — Other Ambulatory Visit: Payer: Self-pay

## 2022-09-26 ENCOUNTER — Ambulatory Visit
Admission: RE | Admit: 2022-09-26 | Discharge: 2022-09-26 | Disposition: A | Discharge status: Home / Self Care | Payer: No Typology Code available for payment source | Attending: Internal Medicine | Admitting: Internal Medicine

## 2022-09-26 ENCOUNTER — Encounter: Payer: Self-pay | Admitting: Internal Medicine

## 2022-09-26 ENCOUNTER — Inpatient Hospital Stay (HOSPITAL_BASED_OUTPATIENT_CLINIC_OR_DEPARTMENT_OTHER): Payer: Medicare HMO | Admitting: Internal Medicine

## 2022-09-26 ENCOUNTER — Ambulatory Visit
Admission: RE | Admit: 2022-09-26 | Discharge: 2022-09-26 | Disposition: A | Discharge status: Home / Self Care | Payer: Medicare HMO | Source: Ambulatory Visit | Attending: Internal Medicine | Admitting: Internal Medicine

## 2022-09-26 ENCOUNTER — Inpatient Hospital Stay: Payer: Medicare HMO | Attending: Internal Medicine

## 2022-09-26 VITALS — BP 123/75 | HR 68 | Temp 97.8°F | Resp 18 | Wt 181.2 lb

## 2022-09-26 DIAGNOSIS — C9110 Chronic lymphocytic leukemia of B-cell type not having achieved remission: Secondary | ICD-10-CM

## 2022-09-26 DIAGNOSIS — M2550 Pain in unspecified joint: Secondary | ICD-10-CM | POA: Diagnosis not present

## 2022-09-26 DIAGNOSIS — R059 Cough, unspecified: Secondary | ICD-10-CM | POA: Diagnosis present

## 2022-09-26 DIAGNOSIS — D649 Anemia, unspecified: Secondary | ICD-10-CM | POA: Insufficient documentation

## 2022-09-26 DIAGNOSIS — Z87891 Personal history of nicotine dependence: Secondary | ICD-10-CM | POA: Diagnosis not present

## 2022-09-26 DIAGNOSIS — Z8673 Personal history of transient ischemic attack (TIA), and cerebral infarction without residual deficits: Secondary | ICD-10-CM | POA: Insufficient documentation

## 2022-09-26 DIAGNOSIS — Z79899 Other long term (current) drug therapy: Secondary | ICD-10-CM | POA: Insufficient documentation

## 2022-09-26 DIAGNOSIS — I4891 Unspecified atrial fibrillation: Secondary | ICD-10-CM | POA: Diagnosis not present

## 2022-09-26 DIAGNOSIS — C9112 Chronic lymphocytic leukemia of B-cell type in relapse: Secondary | ICD-10-CM | POA: Insufficient documentation

## 2022-09-26 LAB — CBC WITH DIFFERENTIAL/PLATELET
Abs Immature Granulocytes: 0.03 10*3/uL (ref 0.00–0.07)
Basophils Absolute: 0 10*3/uL (ref 0.0–0.1)
Basophils Relative: 0 %
Eosinophils Absolute: 0.1 10*3/uL (ref 0.0–0.5)
Eosinophils Relative: 2 %
HCT: 33.6 % — ABNORMAL LOW (ref 39.0–52.0)
Hemoglobin: 10.8 g/dL — ABNORMAL LOW (ref 13.0–17.0)
Immature Granulocytes: 0 %
Lymphocytes Relative: 53 %
Lymphs Abs: 4.1 10*3/uL — ABNORMAL HIGH (ref 0.7–4.0)
MCH: 33.6 pg (ref 26.0–34.0)
MCHC: 32.1 g/dL (ref 30.0–36.0)
MCV: 104.7 fL — ABNORMAL HIGH (ref 80.0–100.0)
Monocytes Absolute: 0.4 10*3/uL (ref 0.1–1.0)
Monocytes Relative: 5 %
Neutro Abs: 3.2 10*3/uL (ref 1.7–7.7)
Neutrophils Relative %: 40 %
Platelets: 161 10*3/uL (ref 150–400)
RBC: 3.21 MIL/uL — ABNORMAL LOW (ref 4.22–5.81)
RDW: 15.1 % (ref 11.5–15.5)
WBC: 7.9 10*3/uL (ref 4.0–10.5)
nRBC: 0 % (ref 0.0–0.2)

## 2022-09-26 LAB — COMPREHENSIVE METABOLIC PANEL
ALT: 17 U/L (ref 0–44)
AST: 16 U/L (ref 15–41)
Albumin: 4.1 g/dL (ref 3.5–5.0)
Alkaline Phosphatase: 88 U/L (ref 38–126)
Anion gap: 6 (ref 5–15)
BUN: 19 mg/dL (ref 8–23)
CO2: 25 mmol/L (ref 22–32)
Calcium: 8.5 mg/dL — ABNORMAL LOW (ref 8.9–10.3)
Chloride: 107 mmol/L (ref 98–111)
Creatinine, Ser: 0.97 mg/dL (ref 0.61–1.24)
GFR, Estimated: >60 mL/min (ref >60)
Glucose, Bld: 106 mg/dL — ABNORMAL HIGH (ref 70–99)
Potassium: 3.8 mmol/L (ref 3.5–5.1)
Sodium: 138 mmol/L (ref 135–145)
Total Bilirubin: 0.9 mg/dL (ref 0.3–1.2)
Total Protein: 7.3 g/dL (ref 6.5–8.1)

## 2022-09-26 LAB — LACTATE DEHYDROGENASE: LDH: 113 U/L (ref 98–192)

## 2022-09-26 MED ORDER — CEPHALEXIN 500 MG PO CAPS: 500.0000 mg | ORAL_CAPSULE | Freq: Three times a day (TID) | ORAL | 0 refills | Status: DC

## 2022-09-26 MED ORDER — BENZONATATE 100 MG PO CAPS: 100.0000 mg | ORAL_CAPSULE | Freq: Three times a day (TID) | ORAL | 0 refills | Status: DC | PRN

## 2022-09-26 NOTE — Progress Notes (Signed)
New Rx for nitro, concerned about a persistent cough for over 1 week, productive with yellow and blood.

## 2022-09-26 NOTE — Progress Notes (Signed)
[facility] OFFICE PROGRESS NOTE  Patient Care Team: Leone Haven, MD as PCP - General (Family Medicine) Rockey Situ Kathlene November, MD as PCP - Cardiology (Cardiology) [name], MD as Consulting Physician (Cardiology) Cammie Sickle, MD as Medical Oncologist (Hematology and Oncology) Gloris Ham, RN as Registered Nurse (Oncology)   Cancer Staging  No matching staging information was found for the patient.   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  MARCH 2017- CT N/C/A/P- NED. STOP Ritux; surveillance   # AUG 2019- CT/PET- progression/NO transformation;  # NOV 2019- Progression; started Ibrutinib 420 mg/d. STOPPED in end of feb sec to extreme fatigue/joint pains/cramps  #November 01, 2018-start ibrutinib 280 mg a day; December 20, 2018-discontinue ibrutinib secondary multiple side effects.   #January 03, 2019 start Gazyva + Ven [July]; finished Dec 2020-; started VLawson Fiscalmaintenance [200 mg a day; DI-amiodarone]; SEP 2022- congestive heart failure; s/p cardiac cath-noted to have CAD however no stents placed.  2D echo- EF-55%  #Mid March 2021-venetoclax dose reduced to 100 mg [second diarrhea]. [place]multiple admissions to the hospital/pneumonia infection stroke x 3 m.   # MAY 2023-progression of disease in the neck ches; Nov 18, 2021-RE-started venetoclax;   # JUNE 29th, 2023-progression of disease in the neck- STOP VENAOCLAX;   # AUG 16th, 2023 START ACALABRUTINIB 100 mg BID [previous intolerance to ibrutinib; delayed sec to C.diff]    #? 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   Ambulating with a cane.   Michael CREAGER89y.o.  male CLL-high risk of recurrent/relapsed currently on  alcabrutinib is here for a follow up.   Patient admits compliance to his alcabrutinib. Notes to have slight improvement of right and left side of his neck. Nodules are not tender.  Concerned about a persistent cough for over 1 week, productive with yellow and blood. No fevers. On mucinex; not on antibiotics. Chest pain- pleuritic.  No night sweats.  Appetite is normal. Energy is up and down. Fair to Low.   Denies any further episodes of C. difficile. Complains of chronic joint pain.  Otherwise no fever no chills.  No nausea no vomiting.  No headache.  Review of Systems  Constitutional:  Positive for malaise/fatigue. Negative for chills, diaphoresis and fever.  HENT:  Negative for nosebleeds and sore throat.   Eyes:  Negative for double vision.  Respiratory:  Negative for hemoptysis, shortness of breath and wheezing.   Cardiovascular:  Negative for chest pain, palpitations, orthopnea and leg swelling.  Gastrointestinal:  Positive for diarrhea. Negative for abdominal pain, blood in stool, constipation, heartburn, melena, nausea and vomiting.  Genitourinary:  Negative for dysuria, frequency and urgency.  Musculoskeletal:  Positive  for back pain, joint pain and myalgias.  Skin: Negative.  Negative for itching and rash.  Neurological:  Negative for dizziness, tingling, focal weakness and headaches.  Psychiatric/Behavioral:  Negative for  depression. The patient is not nervous/anxious and does not have insomnia.    PAST MEDICAL HISTORY :  Past Medical History:  Diagnosis Date   Anxiety    Arthritis    Atrial fibrillation ([place])    a. Dx 2013, recurred 02/2014, CHA2DS2VASc = 3 -->placed on Eliquis;  b. 02/2014 Echo: EF 50-55%, mid and apical anterior septum and mid and apical inf septum are abnl, mild to mod Ao sclerosis w/o AS.   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 01/2021 LV Gram: EF 35-45%; b. 02/2021 Echo: EF 55-60%, no rwma, GrI DD, mildly reduced RV fxn, triv MR, mild-mod AoV sclerosis w/o stenosis.   Chronic lymphocytic leukemia ([place])    a. Dx 02/2014.   Complication of anesthesia    History of  PTSD--do not touch patient when waking up from surgery.   COPD (chronic obstructive pulmonary disease) (HCC)    Coronary artery disease    a. 04/2009 CABG x 3 (LIMA->LAD, VG->OM1, VG->PDA);  b. 09/2009 Cath: occluded VG x 2 w/ patent LIMA and L->R collats. EF 55%, mild antlat HK;  c. 01/2021 Cath: LM mild diff dzs, LAD 100p, LCX nl, RCA 50p, 95d, 99 side branch, RPAV fills via LCX. VG->RPDA 100, VG->OM1 100, LIMA->LAD nl. EF 35-45%.   GERD (gastroesophageal reflux disease)    occasional   History of chemotherapy 2015-2016   HOH (hard of hearing)    Bilateral Hearing Aids   Hyperlipidemia LDL goal <70    Hypertension    Ischemic cardiomyopathy    a. 01/2021 LV Gram: EF 35-45%; b. 02/2021 Echo: EF 55-60%.   Myocardial infarction ([place]) 2010   OSA on CPAP    USE C-PAP   PTSD (post-traumatic stress disorder)    Rheumatic fever 1959   SSS (sick sinus syndrome) ([place])    a. 02/2016 s/p MDT MRI compatible DC PPM (ser# [id number]).   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   Stroke [facility])    TIA (transient ischemic attack) 11/02/2015    PAST SURGICAL HISTORY :   Past Surgical History:  Procedure Laterality Date   ABDOMINAL HERNIA REPAIR      APPENDECTOMY  06/21/1985   CARDIAC CATHETERIZATION  2010; 2011   ; Dr Fletcher Anon   CORONARY ARTERY BYPASS GRAFT  04/2009   "CABG X3"   EP IMPLANTABLE DEVICE N/A 03/03/2016   Procedure: Pacemaker Implant;  Surgeon: Deboraha Sprang, MD;  Location: [facility];  Service: Cardiovascular;  Laterality: N/A;   FOREIGN BODY REMOVAL  1968   "shrapnel in my tailbone"   INGUINAL HERNIA REPAIR Right    INSERT / REPLACE / [place] Right 2018   LAPAROSCOPIC CHOLECYSTECTOMY     RIGHT/LEFT HEART CATH AND CORONARY/GRAFT ANGIOGRAPHY N/A 02/04/2021   Procedure: RIGHT/LEFT HEART CATH AND CORONARY/GRAFT ANGIOGRAPHY;  Surgeon: Nelva Bush, MD;  Location: [facility];  Service: Cardiovascular;  Laterality: N/A;   TONSILLECTOMY AND ADENOIDECTOMY  1956   TOTAL HIP ARTHROPLASTY Right 10/22/2016   Procedure: TOTAL HIP ARTHROPLASTY;  Surgeon: Dereck Leep, MD;  Location: [facility];  Service: Orthopedics;  Laterality: Right;   TOTAL HIP ARTHROPLASTY Left 11/04/2017  Procedure: TOTAL HIP ARTHROPLASTY;  Surgeon: Dereck Leep, MD;  Location: [facility];  Service: Orthopedics;  Laterality: Left;    FAMILY HISTORY :   Family History  Problem Relation Age of Onset   Heart disease Mother    Heart attack Mother    Coronary artery disease Other        family history    SOCIAL HISTORY:   Social History   Tobacco Use   Smoking status: Former    Packs/day: 1.00    Years: 40.00    Total pack years: 40.00    Types: Cigarettes    Quit date: 07/21/2006    Years since quitting: 16.1   Smokeless tobacco: Never  Vaping Use   Vaping Use: Former  Substance Use Topics   Alcohol use: Not Currently   Drug use: No    ALLERGIES:  is allergic to augmentin [amoxicillin-pot clavulanate].  MEDICATIONS:  Current Outpatient Medications  Medication Sig Dispense Refill   acalabrutinib maleate (CALQUENCE) 100 MG tablet Take 1 tablet (100 mg total) by mouth 2 (two) times daily. 60  tablet 2   acetaminophen (TYLENOL) 325 MG tablet Take 1-2 tablets (325-650 mg total) by mouth every 4 (four) hours as needed for mild pain.     acyclovir (ZOVIRAX) 400 MG tablet TAKE 1 TABLET(400 MG) BY MOUTH TWICE DAILY 60 tablet 6   albuterol (VENTOLIN HFA) 108 (90 Base) MCG/ACT inhaler Inhale 2 puffs into the lungs every 6 (six) hours as needed for wheezing or shortness of breath. 1 each 0   Alirocumab (PRALUENT) 150 MG/ML SOAJ ADMINISTER 150 MG UNDER THE SKIN EVERY 14 DAYS 2 mL 2   amiodarone (PACERONE) 200 MG tablet Take 1/2 tablet (100 mg) by mouth twice daily 5 days a week 60 tablet 6   apixaban (ELIQUIS) 5 MG TABS tablet Take 5 mg by mouth 2 (two) times daily.     azelastine (ASTELIN) 0.1 % nasal spray Place 2 sprays into both nostrils 2 (two) times daily. Use in each nostril as directed 30 mL 1   benzonatate (TESSALON) 100 MG capsule Take 1 capsule (100 mg total) by mouth 3 (three) times daily as needed for cough. 60 capsule 0   budesonide-formoterol (SYMBICORT) 160-4.5 MCG/ACT inhaler Inhale 2 puffs into the lungs 2 (two) times daily. 1 Inhaler 11   cephALEXin (KEFLEX) 500 MG capsule Take 1 capsule (500 mg total) by mouth 3 (three) times daily. 15 capsule 0   cetirizine (ZYRTEC) 10 MG tablet Take 10 mg by mouth daily as needed for allergies.      Coenzyme Q10 (COQ10) 200 MG CAPS Take 200 mg by mouth daily.     ezetimibe (ZETIA) 10 MG tablet Take 1 tablet (10 mg total) by mouth daily. 90 tablet 3   furosemide (LASIX) 20 MG tablet Take 1 tablet (20 mg total) by mouth daily. (Patient taking differently: Take 10 mg by mouth daily.) 90 tablet 1   Krill Oil 350 MG CAPS Take 350 mg by mouth daily as needed (Heart).     mirtazapine (REMERON) 15 MG tablet Take 15 mg by mouth at bedtime as needed (for panic associated with PTSD).      Multiple Vitamin (MULTIVITAMIN WITH MINERALS) TABS tablet Take 1 tablet by mouth daily.     nitroGLYCERIN (NITROSTAT) 0.4 MG SL tablet Place 1 tablet (0.4 mg total)  under the tongue every 5 (five) minutes as needed for chest pain. 25 tablet 6   Tiotropium Bromide Monohydrate 2.5 MCG/ACT  AERS Inhale 2 puffs into the lungs at bedtime. Spiriva     traMADol (ULTRAM) 50 MG tablet Take 1 tablet (50 mg total) by mouth every 12 (twelve) hours as needed. 60 tablet 0   gabapentin (NEURONTIN) 300 MG capsule Take by mouth. (Patient not taking: Reported on 09/26/2022)     No current facility-administered medications for this visit.    PHYSICAL EXAMINATION: ECOG PERFORMANCE STATUS: 1 - Symptomatic but completely ambulatory  BP 123/75 (BP Location: Left Arm, Patient Position: Sitting)   Pulse 68   Temp 97.8 F (36.6 C) (Tympanic)   Resp 18   Wt 181 lb 3.2 oz (82.2 kg)   SpO2 100%   BMI 26.76 kg/m   Filed Weights   09/26/22 1056  Weight: 181 lb 3.2 oz (82.2 kg)   Vague swelling noted in the right submandibular area- left neck ~2.5 cm.  Nontender. BIL axillary LN  Physical Exam HENT:     Head: Normocephalic and atraumatic.     Mouth/Throat:     Pharynx: No oropharyngeal exudate.  Eyes:     Pupils: Pupils are equal, round, and reactive to light.  Cardiovascular:     Rate and Rhythm: Normal rate and regular rhythm.  Pulmonary:     Effort: No respiratory distress.     Breath sounds: No wheezing.  Abdominal:     General: Bowel sounds are normal. There is no distension.     Palpations: Abdomen is soft. There is no mass.     Tenderness: There is no abdominal tenderness. There is no guarding or rebound.  Musculoskeletal:        General: No tenderness. Normal range of motion.     Cervical back: Normal range of motion and neck supple.  Skin:    General: Skin is warm.  Neurological:     Mental Status: He is alert and oriented to person, place, and time.  Psychiatric:        Mood and Affect: Affect normal.     LABORATORY DATA:  I have reviewed the data as listed    Component Value Date/Time   NA 138 09/26/2022 1015   NA 143 03/11/2021 1136   NA  139 10/11/2014 1800   K 3.8 09/26/2022 1015   K 3.3 (L) 10/11/2014 1800   CL 107 09/26/2022 1015   CL 106 10/11/2014 1800   CO2 25 09/26/2022 1015   CO2 27 10/11/2014 1800   GLUCOSE 106 (H) 09/26/2022 1015   GLUCOSE 107 (H) 10/11/2014 1800   BUN 19 09/26/2022 1015   BUN 13 03/11/2021 1136   BUN 15 10/11/2014 1800   CREATININE 0.97 09/26/2022 1015   CREATININE 0.89 10/11/2014 1800   CALCIUM 8.5 (L) 09/26/2022 1015   CALCIUM 8.8 (L) 10/11/2014 1800   PROT 7.3 09/26/2022 1015   PROT 6.7 05/18/2017 1048   PROT 6.4 (L) 10/11/2014 1800   ALBUMIN 4.1 09/26/2022 1015   ALBUMIN 4.3 05/18/2017 1048   ALBUMIN 4.1 10/11/2014 1800   AST 16 09/26/2022 1015   AST 23 10/11/2014 1800   ALT 17 09/26/2022 1015   ALT 22 10/11/2014 1800   ALKPHOS 88 09/26/2022 1015   ALKPHOS 61 10/11/2014 1800   BILITOT 0.9 09/26/2022 1015   BILITOT 0.7 05/18/2017 1048   BILITOT 0.9 10/11/2014 1800   GFRNONAA >60 09/26/2022 1015   GFRNONAA >60 10/11/2014 1800   GFRAA >60 04/12/2020 0959   GFRAA >60 10/11/2014 1800    No results found for: "SPEP", "UPEP"  Lab Results  Component Value Date   WBC 7.9 09/26/2022   NEUTROABS 3.2 09/26/2022   HGB 10.8 (L) 09/26/2022   HCT 33.6 (L) 09/26/2022   MCV 104.7 (H) 09/26/2022   PLT 161 09/26/2022      Chemistry      Component Value Date/Time   NA 138 09/26/2022 1015   NA 143 03/11/2021 1136   NA 139 10/11/2014 1800   K 3.8 09/26/2022 1015   K 3.3 (L) 10/11/2014 1800   CL 107 09/26/2022 1015   CL 106 10/11/2014 1800   CO2 25 09/26/2022 1015   CO2 27 10/11/2014 1800   BUN 19 09/26/2022 1015   BUN 13 03/11/2021 1136   BUN 15 10/11/2014 1800   CREATININE 0.97 09/26/2022 1015   CREATININE 0.89 10/11/2014 1800      Component Value Date/Time   CALCIUM 8.5 (L) 09/26/2022 1015   CALCIUM 8.8 (L) 10/11/2014 1800   ALKPHOS 88 09/26/2022 1015   ALKPHOS 61 10/11/2014 1800   AST 16 09/26/2022 1015   AST 23 10/11/2014 1800   ALT 17 09/26/2022 1015   ALT 22  10/11/2014 1800   BILITOT 0.9 09/26/2022 1015   BILITOT 0.7 05/18/2017 1048   BILITOT 0.9 10/11/2014 1800       RADIOGRAPHIC STUDIES: I have personally reviewed the radiological images as listed and agreed with the findings in the report. No results found.   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  Patient currently on Acalabrutnib [AUG 16th, 2023]-  Patient admits to compliance; CT scan-  3rd, JAN 2024-  Interval improvement in enlarged lymph nodes in the neck bilaterally; improvement of the lymphadenopathy above and below the diaphragm.  Continue Acalbrutinib 100 mg BID.   # continue to Acalabrutinib 100 mg BID. Informed pharmacy. Will order scan next visit.   # Mild Anemia Hb ~10-11- NOV 2023- iron sat- 17%; ferritin-71 continue gentle iron/slow iron. Monitor for now.   # Low immunoglobulins IgG 330/recent severe respiratory infection/CLL-s/pI IVIG #4/4 [last April 2022].DEC 2023-- IgG > 1500.  immunoglobulin-Pending results from today-   # Acute bronchitis- get CXR; recommend kelfex tessalon pearls.   # A.fib-on amiodarone [ Dr.Gollan] sinus rhythm-on Eliquis/CHF- lasix 40/d- s/p Cath [AUG, 2022- Echo-55%] -stable.   #Hx C.Diff- colitis- s/p Vancomycin PO -monitor closely. Monitor closely  # Left sided CVA- [s/p rehab; April 2023]- stable.   # Joint pains/ Muscle pain- continue tramadol/CBD prn; refilled-stable.   # Vaccination: s/p Flu shot.   RN to call re: CXR DISPOSITION: #  follow up in 1 month-  MD; labs- cbc/cmp; LDH; vit D 25-OH levels - Dr.B    Orders Placed This Encounter  Procedures   DG Chest 2 View    Standing Status:   Future    Standing Expiration Date:   09/26/2023    Order Specific Question:   Reason for Exam (SYMPTOM  OR DIAGNOSIS REQUIRED)    Answer:   cough/shortness of breath    Order Specific Question:   Preferred imaging location?    Answer:   [facility]   CMP [facility] only)    Standing Status:   Future    Standing Expiration Date:   09/26/2023   CBC with Differential (Cancer Center Only)    Standing Status:   Future    Standing Expiration Date:   09/15/2023   Lactate dehydrogenase    Standing Status:   Future    Standing  Expiration Date:   09/26/2023   All questions were answered. The patient knows to call the clinic with any problems, questions or concerns.     Cammie Sickle, MD 09/26/2022 11:38 AM

## 2022-09-26 NOTE — Assessment & Plan Note (Addendum)
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  Patient currently on Acalabrutnib [AUG 16th, 2023]-  Patient admits to compliance; CT scan-  3rd, JAN 2024-  Interval improvement in enlarged lymph nodes in the neck bilaterally; improvement of the lymphadenopathy above and below the diaphragm.  Continue Acalbrutinib 100 mg BID.   # continue to Acalabrutinib 100 mg BID. Informed pharmacy. Will order scan next visit.   # Mild Anemia Hb ~10-11- NOV 2023- iron sat- 17%; ferritin-71 continue gentle iron/slow iron. Monitor for now.   # Low immunoglobulins IgG 330/recent severe respiratory infection/CLL-s/pI IVIG #4/4 [last April 2022].DEC 2023-- IgG > 1500.  immunoglobulin-Pending results from today-   # Acute bronchitis- get CXR; recommend kelfex tessalon pearls.   # A.fib-on amiodarone [ Dr.Gollan] sinus rhythm-on Eliquis/CHF- lasix 40/d- s/p Cath [AUG, 2022- Echo-55%] -stable.   #Hx C.Diff- colitis- s/p Vancomycin PO -monitor closely. Monitor closely  # Left sided CVA- [s/p rehab; April 2023]- stable.   # Joint pains/ Muscle pain- continue tramadol/CBD prn; refilled-stable.   # Vaccination: s/p Flu shot.   RN to call re: CXR DISPOSITION: #  follow up in 1 month-  MD; labs- cbc/cmp; LDH; vit D 25-OH levels - Dr.B

## 2022-10-02 LAB — IMMUNOGLOBULINS A/E/G/M, SERUM
IgA: 24 mg/dL — ABNORMAL LOW (ref 61–437)
IgE (Immunoglobulin E), Serum: 127 IU/mL (ref 6–495)
IgG (Immunoglobin G), Serum: 1249 mg/dL (ref 603–1613)
IgM (Immunoglobulin M), Srm: 79 mg/dL (ref 15–143)

## 2022-10-06 NOTE — Progress Notes (Unsigned)
. Cardiology Office Note  Date:  10/07/2022   ID:  [name], DOB 07-07-1946, MRN [id number]  PCP:  Michael Haven, MD   Chief Complaint  Patient presents with   4 month follow up     "Doing well." Medications reviewed by the patient verbally.     HPI:  [name] is a 77 year old gentleman with a history of  smoking, coronary artery disease, cabg,  catheterization March 2011 with occluded vein graft 2, patent LIMA, collaterals from left to right,  atrial fibrillation March 2013, 10/11/2014. Converted to normal with metoprolol,  Possible conversion pause. 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  Last seen in clinic by myself  11/2021 Recovered from covid early 2023, hospitalized  In general reports he feels well, weight is down Plan to stay active on his property,  Driving his tractor Uses a scooter/ramp Walker inside  On tramadol as needed for pain Lost weight, 201 to 187 pounds  Got over flu past 3 weeks  Walks with cane, no recent falls  Remains on cancer treatment,On Acalbrutinib 100 mg BID.  Lab work reviewed HGb10.8  Off metoprolol, remains on amiodarone 100 twice daily  On zetia and repatha  Other past medical history reviewed Presenting to the hospital 10/18/2021 for left-sided weakness.   outside the window for thrombolytics.  EKG was unremarkable.   CTA negative for stroke.  Unable to perform MRI due to patient's pacemaker.  CTA of head and neck performed without evidence of LVO.  Neurology consulted and patient likely had suffered ischemic stroke.  Eliquis held and aspirin continued.  Eliquis was started 5 days after initial presentation. Spent time in rehab after admission  C-spine MRI shows multilevel spinal stenosis. Had follow-up by Dr. Arnoldo Morale on 4/13.    On  last clinic visit positive orthostatics, was having diarrhea spells which he attributed to his cancer medication Isosorbide was held at that time   on venetoclax [pre Dr. B: on hold for the last 3 months because of multiple hospitalizations Restarted mediaction recently  Recent pacer downloads reviewed, no significant arrhythmia  Other past medical history reviewed Prior history of falls On a prior office visit reported having a mechanical fall at home in the barn landing on cement Suffered ear trauma on the right, almost tore his ear off, "barely hanging on".   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  Ectatic abdominal aorta at risk for aneurysm development.    Echocardiogram 02/04/20 EF 55 to 60%, no RWMA, mild LVH, grade 1 diastolic dysfunction, RV normal size and function, mildly elevated PASP with RV systolic pressure 0000000 mmHg, mild MR, mild to moderate aortic valve sclerosis without stenosis.    Chronic leg pain, now on tramadol BID "from cancer medication"  Reports long history of bad muscle pain in thighs, No better with dexamethasone constant day and night,   fell off a ladder beginning of October 2017 CT at [facility]: 04/25/2016 showing large hematoma Right gluteal intramuscular hematoma  measuring 9.6 x 6.8 x 4.9 cm [facility] stopped eliquis, statin (unclear why they stopped his statin)  Working with the [facility] for PTSD, reports feeling well, stable Reports that he is been compliant with his Lasix, taking 80 mg daily which is higher dose than before for the past 5 days, no improvement in his leg swelling   atrial fibrillation/flutter starting September 12 2015, then persistent flutter In the clinic his heart rate was 126 bpm Amiodarone  dosing was increased, amlodipine was changed to diltiazem 120 mg daily, metoprolol increased up to 25 mill grams twice a day Converted to normal sinus rhythm at home    tachycardia concerning for atrial fibrillation 05/10/2015. Lasted only several minutes Rate possibly up to 140 bpm.   Previously has been very active, building a barn, tool shed, taking care of animals  some benefits from the [place] for agent orange exposure and his cancer   Previous catheterization showing occluded LAD in the mid vessel, 60% left main, 99% distal RCA who was sent to [facility] 2010 for bypass surgery. He received 3 vessel bypass by Dr.Gerhardt with a LIMA to the LAD, vein graft to the OM1 and vein graft to the PDA, with subsequent cardiac catheter March 2011 for chest discomfort showing occluded vein grafts x2 with patent LIMA. He has collateral vessels from left to right. Ejection fraction 55% mild anterolateral hypokinesis.  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) ([place]), Chronic lymphocytic leukemia ([place]), Complication of anesthesia, COPD (chronic obstructive pulmonary disease) ([place]), Coronary artery disease, GERD (gastroesophageal reflux disease), History of chemotherapy (2015-2016), HOH (hard of hearing), Hyperlipidemia LDL goal <70, Hypertension, Ischemic cardiomyopathy, Myocardial infarction ([place]) (2010), OSA on CPAP, PTSD (post-traumatic stress disorder), Rheumatic fever (1959), SSS (sick sinus syndrome) ([place]), 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  PSH:    Past Surgical History:  Procedure Laterality Date   ABDOMINAL HERNIA REPAIR     APPENDECTOMY  06/21/1985   CARDIAC CATHETERIZATION  2010; 2011   ; Dr Fletcher Anon   CORONARY ARTERY BYPASS GRAFT  04/2009   "CABG X3"   EP  IMPLANTABLE DEVICE N/A 03/03/2016   Procedure: Pacemaker Implant;  Surgeon: Deboraha Sprang, MD;  Location: [facility];  Service: Cardiovascular;  Laterality: N/A;   FOREIGN BODY REMOVAL  1968   "shrapnel in my tailbone"   INGUINAL HERNIA REPAIR Right    INSERT / REPLACE / [place] Right 2018   LAPAROSCOPIC CHOLECYSTECTOMY     RIGHT/LEFT HEART CATH AND CORONARY/GRAFT ANGIOGRAPHY N/A 02/04/2021   Procedure: RIGHT/LEFT HEART CATH AND CORONARY/GRAFT ANGIOGRAPHY;  Surgeon: Nelva Bush, MD;  Location: [facility];  Service: Cardiovascular;  Laterality: N/A;   TONSILLECTOMY AND ADENOIDECTOMY  1956   TOTAL HIP ARTHROPLASTY Right 10/22/2016   Procedure: TOTAL HIP ARTHROPLASTY;  Surgeon: Dereck Leep, MD;  Location: [facility];  Service: Orthopedics;  Laterality: Right;   TOTAL HIP ARTHROPLASTY Left 11/04/2017   Procedure: TOTAL HIP ARTHROPLASTY;  Surgeon: Dereck Leep, MD;  Location: [facility];  Service: Orthopedics;  Laterality: Left;    Current Outpatient Medications  Medication Sig Dispense Refill   acalabrutinib maleate (CALQUENCE) 100 MG tablet Take 1 tablet (100 mg total) by mouth 2 (two) times daily. 60 tablet 2   acetaminophen (TYLENOL) 325 MG tablet Take 1-2 tablets (325-650 mg total) by mouth every 4 (four) hours as needed for  mild pain.     albuterol (VENTOLIN HFA) 108 (90 Base) MCG/ACT inhaler Inhale 2 puffs into the lungs every 6 (six) hours as needed for wheezing or shortness of breath. 1 each 0   Alirocumab (PRALUENT) 150 MG/ML SOAJ ADMINISTER 150 MG UNDER THE SKIN EVERY 14 DAYS 2 mL 2   amiodarone (PACERONE) 200 MG tablet Take 1/2 tablet (100 mg) by mouth twice daily 5 days a week 60 tablet 6   apixaban (ELIQUIS) 5 MG TABS tablet Take 5 mg by mouth 2 (two) times daily.     azelastine (ASTELIN) 0.1 % nasal spray Place 2 sprays into both nostrils 2 (two) times daily. Use in each nostril as directed 30 mL 1   cetirizine (ZYRTEC) 10 MG  tablet Take 10 mg by mouth daily as needed for allergies.      Coenzyme Q10 (COQ10) 200 MG CAPS Take 200 mg by mouth daily.     diphenoxylate-atropine (LOMOTIL) 2.5-0.025 MG tablet Take by mouth 4 (four) times daily as needed for diarrhea or loose stools.     Evolocumab (REPATHA SURECLICK) [id number] MG/ML SOAJ Inject into the skin.     ezetimibe (ZETIA) 10 MG tablet Take 1 tablet (10 mg total) by mouth daily. 90 tablet 3   ferrous sulfate 325 (65 FE) MG EC tablet Take 325 mg by mouth 3 (three) times daily with meals.     furosemide (LASIX) 20 MG tablet Take 10 mg by mouth daily.     gabapentin (NEURONTIN) 300 MG capsule Take by mouth.     Krill Oil 350 MG CAPS Take 350 mg by mouth daily as needed (Heart).     mirtazapine (REMERON) 15 MG tablet Take 15 mg by mouth at bedtime as needed (for panic associated with PTSD).      Multiple Vitamin (MULTIVITAMIN WITH MINERALS) TABS tablet Take 1 tablet by mouth daily.     nitroGLYCERIN (NITROSTAT) 0.4 MG SL tablet Place 1 tablet (0.4 mg total) under the tongue every 5 (five) minutes as needed for chest pain. 25 tablet 6   Tiotropium Bromide Monohydrate 2.5 MCG/ACT AERS Inhale 2 puffs into the lungs at bedtime. Spiriva     traMADol (ULTRAM) 50 MG tablet Take 1 tablet (50 mg total) by mouth every 12 (twelve) hours as needed. 60 tablet 0   acyclovir (ZOVIRAX) 400 MG tablet TAKE 1 TABLET(400 MG) BY MOUTH TWICE DAILY (Patient not taking: Reported on 10/07/2022) 60 tablet 6   benzonatate (TESSALON) 100 MG capsule Take 1 capsule (100 mg total) by mouth 3 (three) times daily as needed for cough. (Patient not taking: Reported on 10/07/2022) 60 capsule 0   budesonide-formoterol (SYMBICORT) 160-4.5 MCG/ACT inhaler Inhale 2 puffs into the lungs 2 (two) times daily. (Patient not taking: Reported on 10/07/2022) 1 Inhaler 11   cephALEXin (KEFLEX) 500 MG capsule Take 1 capsule (500 mg total) by mouth 3 (three) times daily. (Patient not taking: Reported on 10/07/2022) 15 capsule 0    No current facility-administered medications for this visit.     Allergies:   Augmentin [amoxicillin-pot clavulanate]   Social History:  The patient  reports that he quit smoking about 16 years ago. His smoking use included cigarettes. He has a 40.00 pack-year smoking history. He has never used smokeless tobacco. He reports that he does not currently use alcohol. He reports that he does not use drugs.   Family History:   family history includes Coronary artery disease in an other family member; Heart attack in his  mother; Heart disease in his mother.    Review of Systems: Review of Systems  Constitutional: Negative.   HENT: Negative.    Respiratory: Negative.    Cardiovascular: Negative.   Gastrointestinal: Negative.   Musculoskeletal: Negative.   Neurological: Negative.   Psychiatric/Behavioral: Negative.    All other systems reviewed and are negative.   PHYSICAL EXAM: VS:  BP 120/64 (BP Location: Left Arm, Patient Position: Sitting, Cuff Size: Normal)   Ht 5' 8.5" (1.74 m)   Wt 187 lb (84.8 kg)   SpO2 98%   BMI 28.02 kg/m  , BMI Body mass index is 28.02 kg/m. Constitutional:  oriented to person, place, and time. No distress.  HENT:  Head: Grossly normal Eyes:  no discharge. No scleral icterus.  Neck: No JVD, no carotid bruits  Cardiovascular: Regular rate and rhythm, no murmurs appreciated Pulmonary/Chest: Clear to auscultation bilaterally, no wheezes or rails Abdominal: Soft.  no distension.  no tenderness.  Musculoskeletal: Normal range of motion Neurological:  normal muscle tone. Coordination normal. No atrophy Skin: Skin warm and dry Psychiatric: normal affect, pleasant  Recent Labs: 07/09/2022: Magnesium 2.2 09/26/2022: ALT 17; BUN 19; Creatinine, Ser 0.97; Hemoglobin 10.8; Platelets 161; Potassium 3.8; Sodium 138    Lipid Panel Lab Results  Component Value Date   CHOL 79 10/19/2021   HDL 37 (L) 10/19/2021   LDLCALC 27 10/19/2021   TRIG 77 10/19/2021       Wt Readings from Last 3 Encounters:  10/07/22 187 lb (84.8 kg)  10/07/22 187 lb (84.8 kg)  09/26/22 181 lb 3.2 oz (82.2 kg)     ASSESSMENT AND PLAN: Atrial fibrillation, unspecified type (HCC)  Maintaining normal sinus rhythm on clinic exam Continue amiodarone 100 twice daily, metoprolol on hold though could be reinitiated in the setting of tachyarrhythmia On eliquis, no bleeding No falls  HYPERCHOLESTEROLEMIA Currently on zetia , praluent Statin myalgias Lipids at goal  Essential hypertension Blood pressure is well controlled on today's visit. No changes made to the medications.  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 with angina pectoris with documented spasm (HCC) Currently with no symptoms of angina. No further workup at this time. Continue current medication regimen.  Chronic diastolic CHF (congestive heart failure) (HCC) On lasix 40 daily, appears euvolemic  Centrilobular emphysema (HCC) Stable, SOB  Sinus pause  pacemaker placement, reports having occasional dropout or palpitation Pacer downloads reviewed, no significant arrhythmia  Leg pain No extremity Doppler fine, ABIs normal Stable leg strength   Total encounter time more than 30 minutes  Greater than 50% was spent in counseling and coordination of care with the patient   No orders of the defined types were placed in this encounter.    Signed, Esmond Plants, M.D., Ph.D. 10/07/2022  [place], [place]

## 2022-10-07 ENCOUNTER — Ambulatory Visit (INDEPENDENT_AMBULATORY_CARE_PROVIDER_SITE_OTHER): Payer: Medicare HMO | Admitting: Family Medicine

## 2022-10-07 ENCOUNTER — Encounter: Payer: Self-pay | Admitting: Cardiovascular Disease

## 2022-10-07 ENCOUNTER — Encounter: Payer: Self-pay | Admitting: Family Medicine

## 2022-10-07 ENCOUNTER — Ambulatory Visit: Payer: Medicare HMO | Attending: Cardiovascular Disease | Admitting: Cardiovascular Disease

## 2022-10-07 ENCOUNTER — Telehealth: Payer: Self-pay | Admitting: Cardiovascular Disease

## 2022-10-07 VITALS — BP 120/64 | Ht 68.5 in | Wt 187.0 lb

## 2022-10-07 VITALS — BP 118/76 | HR 76 | Temp 97.8°F | Ht 69.0 in | Wt 187.0 lb

## 2022-10-07 DIAGNOSIS — G4733 Obstructive sleep apnea (adult) (pediatric): Secondary | ICD-10-CM

## 2022-10-07 DIAGNOSIS — I495 Sick sinus syndrome: Secondary | ICD-10-CM

## 2022-10-07 DIAGNOSIS — J449 Chronic obstructive pulmonary disease, unspecified: Secondary | ICD-10-CM

## 2022-10-07 DIAGNOSIS — I5032 Chronic diastolic (congestive) heart failure: Secondary | ICD-10-CM

## 2022-10-07 DIAGNOSIS — I10 Essential (primary) hypertension: Secondary | ICD-10-CM

## 2022-10-07 DIAGNOSIS — I2510 Atherosclerotic heart disease of native coronary artery without angina pectoris: Secondary | ICD-10-CM | POA: Diagnosis not present

## 2022-10-07 DIAGNOSIS — Z950 Presence of cardiac pacemaker: Secondary | ICD-10-CM

## 2022-10-07 DIAGNOSIS — I480 Paroxysmal atrial fibrillation: Secondary | ICD-10-CM

## 2022-10-07 DIAGNOSIS — J988 Other specified respiratory disorders: Secondary | ICD-10-CM | POA: Insufficient documentation

## 2022-10-07 DIAGNOSIS — E7800 Pure hypercholesterolemia, unspecified: Secondary | ICD-10-CM | POA: Diagnosis not present

## 2022-10-07 NOTE — Patient Instructions (Signed)
Medication Instructions:  No changes  If you need a refill on your cardiac medications before your next appointment, please call your pharmacy.   Lab work: No new labs needed  Testing/Procedures: No new testing needed  Follow-Up: At [facility], you and your health needs are our priority.  As part of our continuing mission to provide you with exceptional heart care, we have created designated Provider Care Teams.  These Care Teams include your primary Cardiologist (physician) and Advanced Practice Providers (APPs -  Physician Assistants and Nurse Practitioners) who all work together to provide you with the care you need, when you need it.  You will need a follow up appointment in 12 months  Providers on your designated Care Team:   Christopher Berge, NP Ryan Dunn, PA-C Cadence Furth, PA-C  COVID-19 Vaccine Information can be found at: [email or web address] For questions related to vaccine distribution or appointments, please email [email or web address] or call [phone].   

## 2022-10-07 NOTE — Progress Notes (Signed)
[name], MD Phone: [phone]  [name] is a 77 y.o. male who presents today for follow-up.  Hypertension/A-fib/CHF: Patient notes blood pressures have typically been "normal."  Occasionally will drop into the 80s over 40s though this only occurs once a month or so.  He notes no lightheadedness with this.  He denies chest pain, edema, PND, palpitations, and bleeding issues.  He does note some orthopnea that is stable.  He notes no change to his chronic shortness of breath.  Respiratory infection: Patient notes he came down with a respiratory infection a few ago.  He notes his oncologist placed him on medication for this and he has been progressively improving.  He still has some cough though it is improving.  He had some chest discomfort with the cough though he attributes this to muscular discomfort and this has resolved.  He notes no fevers.  Social History   Tobacco Use  Smoking Status Former   Packs/day: 1.00   Years: 40.00   Additional pack years: 0.00   Total pack years: 40.00   Types: Cigarettes   Quit date: 07/21/2006   Years since quitting: 16.2  Smokeless Tobacco Never    Current Outpatient Medications on File Prior to Visit  Medication Sig Dispense Refill   acalabrutinib maleate (CALQUENCE) 100 MG tablet Take 1 tablet (100 mg total) by mouth 2 (two) times daily. 60 tablet 2   acetaminophen (TYLENOL) 325 MG tablet Take 1-2 tablets (325-650 mg total) by mouth every 4 (four) hours as needed for mild pain.     acyclovir (ZOVIRAX) 400 MG tablet TAKE 1 TABLET(400 MG) BY MOUTH TWICE DAILY 60 tablet 6   albuterol (VENTOLIN HFA) 108 (90 Base) MCG/ACT inhaler Inhale 2 puffs into the lungs every 6 (six) hours as needed for wheezing or shortness of breath. 1 each 0   Alirocumab (PRALUENT) 150 MG/ML SOAJ ADMINISTER 150 MG UNDER THE SKIN EVERY 14 DAYS 2 mL 2   amiodarone (PACERONE) 200 MG tablet Take 1/2 tablet (100 mg) by mouth twice daily 5 days a week 60 tablet 6    apixaban (ELIQUIS) 5 MG TABS tablet Take 5 mg by mouth 2 (two) times daily.     azelastine (ASTELIN) 0.1 % nasal spray Place 2 sprays into both nostrils 2 (two) times daily. Use in each nostril as directed 30 mL 1   benzonatate (TESSALON) 100 MG capsule Take 1 capsule (100 mg total) by mouth 3 (three) times daily as needed for cough. 60 capsule 0   budesonide-formoterol (SYMBICORT) 160-4.5 MCG/ACT inhaler Inhale 2 puffs into the lungs 2 (two) times daily. 1 Inhaler 11   cephALEXin (KEFLEX) 500 MG capsule Take 1 capsule (500 mg total) by mouth 3 (three) times daily. 15 capsule 0   cetirizine (ZYRTEC) 10 MG tablet Take 10 mg by mouth daily as needed for allergies.      Coenzyme Q10 (COQ10) 200 MG CAPS Take 200 mg by mouth daily.     diphenoxylate-atropine (LOMOTIL) 2.5-0.025 MG tablet Take by mouth 4 (four) times daily as needed for diarrhea or loose stools.     ezetimibe (ZETIA) 10 MG tablet Take 1 tablet (10 mg total) by mouth daily. 90 tablet 3   ferrous sulfate 325 (65 FE) MG EC tablet Take 325 mg by mouth 3 (three) times daily with meals.     furosemide (LASIX) 20 MG tablet Take 1 tablet (20 mg total) by mouth daily. (Patient taking differently: Take 10 mg by mouth daily.) 90  tablet 1   gabapentin (NEURONTIN) 300 MG capsule Take by mouth.     Krill Oil 350 MG CAPS Take 350 mg by mouth daily as needed (Heart).     mirtazapine (REMERON) 15 MG tablet Take 15 mg by mouth at bedtime as needed (for panic associated with PTSD).      Multiple Vitamin (MULTIVITAMIN WITH MINERALS) TABS tablet Take 1 tablet by mouth daily.     nitroGLYCERIN (NITROSTAT) 0.4 MG SL tablet Place 1 tablet (0.4 mg total) under the tongue every 5 (five) minutes as needed for chest pain. 25 tablet 6   Tiotropium Bromide Monohydrate 2.5 MCG/ACT AERS Inhale 2 puffs into the lungs at bedtime. Spiriva     traMADol (ULTRAM) 50 MG tablet Take 1 tablet (50 mg total) by mouth every 12 (twelve) hours as needed. 60 tablet 0   No current  facility-administered medications on file prior to visit.     ROS see history of present illness  Objective  Physical Exam Vitals:   10/07/22 0921  BP: 118/76  Pulse: 76  Temp: 97.8 F (36.6 C)  SpO2: 99%    BP Readings from Last 3 Encounters:  10/07/22 118/76  09/26/22 123/75  08/28/22 135/77   Wt Readings from Last 3 Encounters:  10/07/22 187 lb (84.8 kg)  09/26/22 181 lb 3.2 oz (82.2 kg)  09/12/22 188 lb (85.3 kg)    Physical Exam Constitutional:      General: He is not in acute distress.    Appearance: He is not diaphoretic.  Cardiovascular:     Rate and Rhythm: Normal rate and regular rhythm.     Heart sounds: Normal heart sounds.  Pulmonary:     Effort: Pulmonary effort is normal.     Breath sounds: Normal breath sounds.  Musculoskeletal:     Right lower leg: No edema.     Left lower leg: No edema.  Skin:    General: Skin is warm and dry.  Neurological:     Mental Status: He is alert.      Assessment/Plan: Please see individual problem list.  Essential hypertension Assessment & Plan: Chronic issue.  Adequately controlled.  Patient will continue Lasix 20 mg daily.  He will monitor for low blood pressures and if they are persistently low he will let us know.   Paroxysmal atrial fibrillation (HCC) Assessment & Plan: Chronic issue.  Sinus rhythm today.  He will continue Eliquis 5 mg twice daily.  Amiodarone managed by cardiology.   Chronic diastolic CHF (congestive heart failure) ([place]) Assessment & Plan: Chronic issue.  Appears to be euvolemic.  He can continue torsemide 20 mg daily.   Respiratory infection Assessment & Plan: Progressively improving.  He will monitor and if he has any worsening symptoms he will let us know.     Return in about 3 months (around 01/07/2023).   [name], MD [place]

## 2022-10-07 NOTE — Assessment & Plan Note (Signed)
Chronic issue.  Appears to be euvolemic.  He can continue torsemide 20 mg daily.

## 2022-10-07 NOTE — Assessment & Plan Note (Signed)
Progressively improving.  He will monitor and if he has any worsening symptoms he will let us know.

## 2022-10-07 NOTE — Assessment & Plan Note (Signed)
Chronic issue.  Sinus rhythm today.  He will continue Eliquis 5 mg twice daily.  Amiodarone managed by cardiology.

## 2022-10-07 NOTE — Assessment & Plan Note (Signed)
Chronic issue.  Adequately controlled.  Patient will continue Lasix 20 mg daily.  He will monitor for low blood pressures and if they are persistently low he will let us know.

## 2022-10-07 NOTE — Telephone Encounter (Signed)
Pt c/o medication issue:  1. Name of Medication: Alirocumab (PRALUENT) 150 MG/ML SOAJ   2. How are you currently taking this medication (dosage and times per day)?    ADMINISTER 150 MG UNDER THE SKIN EVERY 14 DAYS    3. Are you having a reaction (difficulty breathing--STAT)? no  4. What is your medication issue? Prior Josem Kaufmann is needed. Please advise

## 2022-10-09 ENCOUNTER — Other Ambulatory Visit (HOSPITAL_COMMUNITY): Payer: Self-pay

## 2022-10-09 ENCOUNTER — Telehealth: Payer: Self-pay | Admitting: Cardiovascular Disease

## 2022-10-09 MED ORDER — REPATHA SURECLICK 140 MG/ML ~~LOC~~ SOAJ: 1.0000 mL | SUBCUTANEOUS | 11 refills | Status: DC

## 2022-10-09 NOTE — Telephone Encounter (Signed)
Patient states that he is returning call and is requesting return call.

## 2022-10-09 NOTE — Telephone Encounter (Signed)
Pt insurance prefers Repatha. PA was approved. Called pt to make him aware of change. LVM for patient to call back

## 2022-10-09 NOTE — Telephone Encounter (Signed)
Patient made aware of the change 

## 2022-10-09 NOTE — Telephone Encounter (Signed)
Pt c/o medication issue:  1. Name of Medication: Alirocumab (PRALUENT) 150 MG/ML SOAJ   2. How are you currently taking this medication (dosage and times per day)?ADMINISTER 150 MG UNDER THE SKIN EVERY 14 DAYS   3. Are you having a reaction (difficulty breathing--STAT)? No  4. What is your medication issue? Estill Bamberg is calling about prior authorization for the [employer]

## 2022-10-10 MED ORDER — REPATHA SURECLICK 140 MG/ML ~~LOC~~ SOAJ: 1.0000 mL | SUBCUTANEOUS | 11 refills | Status: DC

## 2022-10-10 NOTE — Telephone Encounter (Signed)
Caller is following-up on patient's prior authorization for  Alirocumab (PRALUENT) 150 MG/ML SOAJ.  Ref# [id number]

## 2022-10-10 NOTE — Telephone Encounter (Signed)
Pt was changed to [name] a few months ago due to formulary change. Rx was sent in yesterday for Repatha, unclear why pharmacy is trying to fill Praluent again. Will notify them to fill Repatha rx.

## 2022-10-13 ENCOUNTER — Telehealth: Payer: Self-pay | Admitting: Cardiovascular Disease

## 2022-10-13 NOTE — Telephone Encounter (Signed)
Pt c/o medication issue:  1. Name of Medication: Praluent 150 mg  2. How are you currently taking this medication (dosage and times per day)? N/A  3. Are you having a reaction (difficulty breathing--STAT)? N/A   4. What is your medication issue? Calling regarding not receiving PA for this medication. Please give ref # listed upon callback.

## 2022-10-13 NOTE — Telephone Encounter (Signed)
[name] has been made aware that the patient is not on Praluent anymore.

## 2022-10-13 NOTE — Telephone Encounter (Signed)
Patient is on [place]. PA is active.

## 2022-10-16 ENCOUNTER — Other Ambulatory Visit (HOSPITAL_COMMUNITY): Payer: Self-pay

## 2022-10-21 ENCOUNTER — Other Ambulatory Visit: Payer: Self-pay

## 2022-10-27 ENCOUNTER — Encounter: Payer: Self-pay | Admitting: Internal Medicine

## 2022-10-27 ENCOUNTER — Telehealth: Payer: Self-pay | Admitting: Internal Medicine

## 2022-10-27 ENCOUNTER — Inpatient Hospital Stay (HOSPITAL_BASED_OUTPATIENT_CLINIC_OR_DEPARTMENT_OTHER): Payer: Medicare HMO | Admitting: Internal Medicine

## 2022-10-27 ENCOUNTER — Inpatient Hospital Stay: Payer: Medicare HMO | Attending: Internal Medicine

## 2022-10-27 VITALS — BP 124/62 | HR 63 | Temp 95.8°F | Resp 16 | Ht 68.5 in | Wt 193.1 lb

## 2022-10-27 DIAGNOSIS — C9110 Chronic lymphocytic leukemia of B-cell type not having achieved remission: Secondary | ICD-10-CM | POA: Insufficient documentation

## 2022-10-27 DIAGNOSIS — I4891 Unspecified atrial fibrillation: Secondary | ICD-10-CM | POA: Insufficient documentation

## 2022-10-27 DIAGNOSIS — Z79899 Other long term (current) drug therapy: Secondary | ICD-10-CM | POA: Insufficient documentation

## 2022-10-27 DIAGNOSIS — D649 Anemia, unspecified: Secondary | ICD-10-CM | POA: Diagnosis not present

## 2022-10-27 DIAGNOSIS — M2550 Pain in unspecified joint: Secondary | ICD-10-CM | POA: Diagnosis not present

## 2022-10-27 DIAGNOSIS — Z8673 Personal history of transient ischemic attack (TIA), and cerebral infarction without residual deficits: Secondary | ICD-10-CM | POA: Diagnosis not present

## 2022-10-27 LAB — CMP (CANCER CENTER ONLY)
ALT: 17 U/L (ref 0–44)
AST: 18 U/L (ref 15–41)
Albumin: 4.2 g/dL (ref 3.5–5.0)
Alkaline Phosphatase: 83 U/L (ref 38–126)
Anion gap: 5 (ref 5–15)
BUN: 29 mg/dL — ABNORMAL HIGH (ref 8–23)
CO2: 28 mmol/L (ref 22–32)
Calcium: 8.9 mg/dL (ref 8.9–10.3)
Chloride: 107 mmol/L (ref 98–111)
Creatinine: 1.01 mg/dL (ref 0.61–1.24)
GFR, Estimated: >60 mL/min (ref >60)
Glucose, Bld: 113 mg/dL — ABNORMAL HIGH (ref 70–99)
Potassium: 4.1 mmol/L (ref 3.5–5.1)
Sodium: 140 mmol/L (ref 135–145)
Total Bilirubin: 0.6 mg/dL (ref 0.3–1.2)
Total Protein: 7.4 g/dL (ref 6.5–8.1)

## 2022-10-27 LAB — CBC WITH DIFFERENTIAL (CANCER CENTER ONLY)
Abs Immature Granulocytes: 0.03 10*3/uL (ref 0.00–0.07)
Basophils Absolute: 0 10*3/uL (ref 0.0–0.1)
Basophils Relative: 0 %
Eosinophils Absolute: 0.1 10*3/uL (ref 0.0–0.5)
Eosinophils Relative: 1 %
HCT: 35.4 % — ABNORMAL LOW (ref 39.0–52.0)
Hemoglobin: 11.5 g/dL — ABNORMAL LOW (ref 13.0–17.0)
Immature Granulocytes: 0 %
Lymphocytes Relative: 59 %
Lymphs Abs: 5.5 10*3/uL — ABNORMAL HIGH (ref 0.7–4.0)
MCH: 33.9 pg (ref 26.0–34.0)
MCHC: 32.5 g/dL (ref 30.0–36.0)
MCV: 104.4 fL — ABNORMAL HIGH (ref 80.0–100.0)
Monocytes Absolute: 0.5 10*3/uL (ref 0.1–1.0)
Monocytes Relative: 5 %
Neutro Abs: 3.4 10*3/uL (ref 1.7–7.7)
Neutrophils Relative %: 35 %
Platelet Count: 167 10*3/uL (ref 150–400)
RBC: 3.39 MIL/uL — ABNORMAL LOW (ref 4.22–5.81)
RDW: 14.7 % (ref 11.5–15.5)
Smear Review: NORMAL
WBC Count: 9.6 10*3/uL (ref 4.0–10.5)
nRBC: 0 % (ref 0.0–0.2)

## 2022-10-27 LAB — LACTATE DEHYDROGENASE: LDH: 120 U/L (ref 98–192)

## 2022-10-27 NOTE — Assessment & Plan Note (Addendum)
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  Patient currently on Acalabrutnib [AUG 16th, 2023]-  Patient admits to compliance; CT scan-  3rd, JAN 2024-  Interval improvement in enlarged lymph nodes in the neck bilaterally; improvement of the lymphadenopathy above and below the diaphragm.  Continue Acalbrutinib 100 mg BID.   # continue to Acalabrutinib 100 mg BID. Informed pharmacy. Will order scan today; in 2 months  # Mild Anemia Hb ~10-11- NOV 2023- iron sat- 17%; ferritin-71 continue gentle iron/slow iron. Monitor for now.   # Low immunoglobulins IgG 330/recent severe respiratory infection/CLL-s/pI IVIG #4/4 [last April 2022].MARCH 2024-- IgG > 1500.    # A.fib-on amiodarone [ Dr.Gollan] sinus rhythm-on Eliquis/CHF- lasix 40/d- s/p Cath [AUG, 2022- Echo-55%] -stable   #Hx C.Diff- colitis- s/p Vancomycin PO -monitor closely. Monitor closely. stable  # Left sided CVA- [s/p rehab; April 2023]- stable  # Joint pains/ Muscle pain- continue tramadol/CBD prn; refilled-stable  # Vaccination: s/p Flu shot.   DISPOSITION: #  follow up in 2 month-  MD; labs- cbc/cmp; LDH; prior- CT scan CAP; neck-- Dr.B

## 2022-10-27 NOTE — Telephone Encounter (Signed)
Per Chat-   follow up in 2 month- MD; labs- cbc/cmp; LDH; prior- CT scan CAP; neck   Appointment scheduled

## 2022-10-27 NOTE — Progress Notes (Signed)
[facility] OFFICE PROGRESS NOTE  Patient Care Team: Glori Luis, MD as PCP - General (Family Medicine) Mariah Milling Tollie Pizza, MD as PCP - Cardiology (Cardiology) Antonieta Iba, MD as Consulting Physician (Cardiology) Earna Coder, MD as Medical Oncologist (Hematology and Oncology) Keitha Butte, RN as Registered Nurse (Oncology)   Cancer Staging  No matching staging information was found for the patient.   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  MARCH 2017- CT N/C/A/P- NED. STOP Ritux; surveillance   # AUG 2019- CT/PET- progression/NO transformation;  # NOV 2019- Progression; started Ibrutinib 420 mg/d. STOPPED in end of feb sec to extreme fatigue/joint pains/cramps  #November 01, 2018-start ibrutinib 280 mg a day; December 20, 2018-discontinue ibrutinib secondary multiple side effects.   #January 03, 2019 start Gazyva + Ven [July]; finished Dec 2020-; started Peter Minium maintenance [200 mg a day; DI-amiodarone]; SEP 2022- congestive heart failure; s/p cardiac cath-noted to have CAD however no stents placed.  2D echo- EF-55%  #Mid March 2021-venetoclax dose reduced to 100 mg [second diarrhea]. HELD in DEC 2022/JAN 2023 x3 months-multiple admissions to the hospital/pneumonia infection stroke x 3 m.   # MAY 2023-progression of disease in the neck ches; Nov 18, 2021-RE-started venetoclax;   # JUNE 29th, 2023-progression of disease in the neck- STOP VENAOCLAX;   # AUG 16th, 2023 START ACALABRUTINIB 100 mg BID [previous intolerance to ibrutinib; delayed sec to C.diff]    #? 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 Ambulating with a cane.   BRAIAN TIJERINA 77 y.o.  male CLL-high risk of recurrent/relapsed currently on  alcabrutinib is here for a follow up.   Patient admits compliance to his alcabrutinib. Notes to have slight improvement of right and left side of his neck.   No more hospital visits. No night sweats.  Appetite is normal. Energy is up and down. Fair to Low.   Denies any further episodes of C. difficile. Complains of chronic joint pain.  Otherwise no fever no chills.  No nausea no vomiting.  No headache.  Review of Systems  Constitutional:  Positive for malaise/fatigue. Negative for chills, diaphoresis and fever.  HENT:  Negative for nosebleeds and sore throat.   Eyes:  Negative for double vision.  Respiratory:  Negative for hemoptysis, shortness of breath and wheezing.   Cardiovascular:  Negative for chest pain, palpitations, orthopnea and leg swelling.  Gastrointestinal:  Negative for abdominal pain, blood in stool, constipation, heartburn, melena, nausea and vomiting.  Genitourinary:  Negative for dysuria, frequency and urgency.  Musculoskeletal:  Positive for back pain, joint pain and myalgias.  Skin: Negative.  Negative for itching and rash.  Neurological:  Negative for dizziness, tingling, focal weakness and headaches.  Psychiatric/Behavioral:  Negative for depression.  The patient is not nervous/anxious and does not have insomnia.    PAST MEDICAL HISTORY :  Past Medical History:  Diagnosis Date   Anxiety    Arthritis    Atrial fibrillation    a. Dx 2013,  recurred 02/2014, CHA2DS2VASc = 3 -->placed on Eliquis;  b. 02/2014 Echo: EF 50-55%, mid and apical anterior septum and mid and apical inf septum are abnl, mild to mod Ao sclerosis w/o AS.   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 01/2021 LV Gram: EF 35-45%; b. 02/2021 Echo: EF 55-60%, no rwma, GrI DD, mildly reduced RV fxn, triv MR, mild-mod AoV sclerosis w/o stenosis.   Chronic lymphocytic leukemia    a. Dx 02/2014.   Complication of anesthesia    History of  PTSD--do not touch patient when waking up from surgery.   COPD (chronic obstructive pulmonary disease)    Coronary artery disease    a. 04/2009 CABG x 3 (LIMA->LAD, VG->OM1, VG->PDA);  b. 09/2009 Cath: occluded VG x 2 w/ patent LIMA and L->R collats. EF 55%, mild antlat HK;  c. 01/2021 Cath: LM mild diff dzs, LAD 100p, LCX nl, RCA 50p, 95d, 99 side branch, RPAV fills via LCX. VG->RPDA 100, VG->OM1 100, LIMA->LAD nl. EF 35-45%.   GERD (gastroesophageal reflux disease)    occasional   History of chemotherapy 2015-2016   HOH (hard of hearing)    Bilateral Hearing Aids   Hyperlipidemia LDL goal <70    Hypertension    Ischemic cardiomyopathy    a. 01/2021 LV Gram: EF 35-45%; b. 02/2021 Echo: EF 55-60%.   Myocardial infarction 2010   OSA on CPAP    USE C-PAP   PTSD (post-traumatic stress disorder)    Rheumatic fever 1959   SSS (sick sinus syndrome)    a. 02/2016 s/p MDT MRI compatible DC PPM (ser# [id number]).   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   Stroke    TIA (transient ischemic attack) 11/02/2015    PAST SURGICAL HISTORY :   Past Surgical History:  Procedure Laterality Date   ABDOMINAL HERNIA REPAIR     APPENDECTOMY  06/21/1985   CARDIAC CATHETERIZATION  2010; 2011   ; Dr Kirke Corin   CORONARY ARTERY BYPASS GRAFT  04/2009   "CABG X3"   EP IMPLANTABLE DEVICE N/A 03/03/2016   Procedure: Pacemaker Implant;  Surgeon: Duke Salvia, MD;   [facility]: [facility] INVASIVE CV LAB;  Service: Cardiovascular;  Laterality: N/A;   FOREIGN BODY REMOVAL  1968   "shrapnel in my tailbone"   INGUINAL HERNIA REPAIR Right    INSERT / REPLACE / REMOVE PACEMAKER     JOINT REPLACEMENT Right 2018   LAPAROSCOPIC CHOLECYSTECTOMY     RIGHT/LEFT HEART CATH AND CORONARY/GRAFT ANGIOGRAPHY N/A 02/04/2021   Procedure: RIGHT/LEFT HEART CATH AND CORONARY/GRAFT ANGIOGRAPHY;  Surgeon: Yvonne Kendall, MD;  Location: [facility] INVASIVE CV LAB;  Service: Cardiovascular;  Laterality: N/A;   TONSILLECTOMY AND ADENOIDECTOMY  1956   TOTAL HIP ARTHROPLASTY Right 10/22/2016   Procedure: TOTAL HIP ARTHROPLASTY;  Surgeon: Donato Heinz, MD;  Location: [facility] ORS;  Service: Orthopedics;  Laterality: Right;   TOTAL HIP ARTHROPLASTY Left 11/04/2017   Procedure: TOTAL HIP ARTHROPLASTY;  Surgeon: Donato Heinz, MD;  Location: [facility] ORS;  Service: Orthopedics;  Laterality: Left;    FAMILY HISTORY :   Family History  Problem Relation Age of Onset  Heart disease Mother    Heart attack Mother    Coronary artery disease Other        family history    SOCIAL HISTORY:   Social History   Tobacco Use   Smoking status: Former    Packs/day: 1.00    Years: 40.00    Additional pack years: 0.00    Total pack years: 40.00    Types: Cigarettes    Quit date: 07/21/2006    Years since quitting: 16.2   Smokeless tobacco: Never  Vaping Use   Vaping Use: Former  Substance Use Topics   Alcohol use: Not Currently   Drug use: No    ALLERGIES:  is allergic to augmentin [amoxicillin-pot clavulanate].  MEDICATIONS:  Current Outpatient Medications  Medication Sig Dispense Refill   acalabrutinib maleate (CALQUENCE) 100 MG tablet Take 1 tablet (100 mg total) by mouth 2 (two) times daily. 60 tablet 2   acetaminophen (TYLENOL) 325 MG tablet Take 1-2 tablets (325-650 mg total) by mouth every 4 (four) hours as needed for mild pain.     albuterol (VENTOLIN HFA) 108 (90 Base) MCG/ACT inhaler Inhale 2  puffs into the lungs every 6 (six) hours as needed for wheezing or shortness of breath. 1 each 0   amiodarone (PACERONE) 200 MG tablet Take 1/2 tablet (100 mg) by mouth twice daily 5 days a week 60 tablet 6   apixaban (ELIQUIS) 5 MG TABS tablet Take 5 mg by mouth 2 (two) times daily.     azelastine (ASTELIN) 0.1 % nasal spray Place 2 sprays into both nostrils 2 (two) times daily. Use in each nostril as directed 30 mL 1   cetirizine (ZYRTEC) 10 MG tablet Take 10 mg by mouth daily as needed for allergies.      Coenzyme Q10 (COQ10) 200 MG CAPS Take 200 mg by mouth daily.     diphenoxylate-atropine (LOMOTIL) 2.5-0.025 MG tablet Take by mouth 4 (four) times daily as needed for diarrhea or loose stools.     Evolocumab (REPATHA SURECLICK) 140 MG/ML SOAJ Inject 140 mg into the skin every 14 (fourteen) days. 2 mL 11   ezetimibe (ZETIA) 10 MG tablet Take 1 tablet (10 mg total) by mouth daily. 90 tablet 3   ferrous sulfate 325 (65 FE) MG EC tablet Take 325 mg by mouth 3 (three) times daily with meals.     furosemide (LASIX) 20 MG tablet Take 10 mg by mouth daily.     gabapentin (NEURONTIN) 300 MG capsule Take by mouth.     Krill Oil 350 MG CAPS Take 350 mg by mouth daily as needed (Heart).     mirtazapine (REMERON) 15 MG tablet Take 15 mg by mouth at bedtime as needed (for panic associated with PTSD).      Multiple Vitamin (MULTIVITAMIN WITH MINERALS) TABS tablet Take 1 tablet by mouth daily.     nitroGLYCERIN (NITROSTAT) 0.4 MG SL tablet Place 1 tablet (0.4 mg total) under the tongue every 5 (five) minutes as needed for chest pain. 25 tablet 6   Tiotropium Bromide Monohydrate 2.5 MCG/ACT AERS Inhale 2 puffs into the lungs at bedtime. Spiriva     traMADol (ULTRAM) 50 MG tablet Take 1 tablet (50 mg total) by mouth every 12 (twelve) hours as needed. 60 tablet 0   acyclovir (ZOVIRAX) 400 MG tablet TAKE 1 TABLET(400 MG) BY MOUTH TWICE DAILY (Patient not taking: Reported on 10/07/2022) 60 tablet 6   benzonatate  (TESSALON) 100 MG capsule Take 1 capsule (  100 mg total) by mouth 3 (three) times daily as needed for cough. (Patient not taking: Reported on 10/07/2022) 60 capsule 0   budesonide-formoterol (SYMBICORT) 160-4.5 MCG/ACT inhaler Inhale 2 puffs into the lungs 2 (two) times daily. (Patient not taking: Reported on 10/07/2022) 1 Inhaler 11   cephALEXin (KEFLEX) 500 MG capsule Take 1 capsule (500 mg total) by mouth 3 (three) times daily. (Patient not taking: Reported on 10/07/2022) 15 capsule 0   No current facility-administered medications for this visit.    PHYSICAL EXAMINATION: ECOG PERFORMANCE STATUS: 1 - Symptomatic but completely ambulatory  BP 124/62   Pulse 63   Temp (!) 95.8 F (35.4 C) (Tympanic)   Resp 16   Ht 5' 8.5" (1.74 m)   Wt 193 lb 1.6 oz (87.6 kg)   SpO2 100%   BMI 28.93 kg/m   Filed Weights   10/27/22 0943  Weight: 193 lb 1.6 oz (87.6 kg)    Physical Exam HENT:     Head: Normocephalic and atraumatic.     Mouth/Throat:     Pharynx: No oropharyngeal exudate.  Eyes:     Pupils: Pupils are equal, round, and reactive to light.  Cardiovascular:     Rate and Rhythm: Normal rate and regular rhythm.  Pulmonary:     Effort: No respiratory distress.     Breath sounds: No wheezing.  Abdominal:     General: Bowel sounds are normal. There is no distension.     Palpations: Abdomen is soft. There is no mass.     Tenderness: There is no abdominal tenderness. There is no guarding or rebound.  Musculoskeletal:        General: No tenderness. Normal range of motion.     Cervical back: Normal range of motion and neck supple.  Skin:    General: Skin is warm.  Neurological:     Mental Status: He is alert and oriented to person, place, and time.  Psychiatric:        Mood and Affect: Affect normal.     LABORATORY DATA:  I have reviewed the data as listed    Component Value Date/Time   NA 140 10/27/2022 0929   NA 143 03/11/2021 1136   NA 139 10/11/2014 1800   K 4.1  10/27/2022 0929   K 3.3 (L) 10/11/2014 1800   CL 107 10/27/2022 0929   CL 106 10/11/2014 1800   CO2 28 10/27/2022 0929   CO2 27 10/11/2014 1800   GLUCOSE 113 (H) 10/27/2022 0929   GLUCOSE 107 (H) 10/11/2014 1800   BUN 29 (H) 10/27/2022 0929   BUN 13 03/11/2021 1136   BUN 15 10/11/2014 1800   CREATININE 1.01 10/27/2022 0929   CREATININE 0.89 10/11/2014 1800   CALCIUM 8.9 10/27/2022 0929   CALCIUM 8.8 (L) 10/11/2014 1800   PROT 7.4 10/27/2022 0929   PROT 6.7 05/18/2017 1048   PROT 6.4 (L) 10/11/2014 1800   ALBUMIN 4.2 10/27/2022 0929   ALBUMIN 4.3 05/18/2017 1048   ALBUMIN 4.1 10/11/2014 1800   AST 18 10/27/2022 0929   ALT 17 10/27/2022 0929   ALT 22 10/11/2014 1800   ALKPHOS 83 10/27/2022 0929   ALKPHOS 61 10/11/2014 1800   BILITOT 0.6 10/27/2022 0929   GFRNONAA >60 10/27/2022 0929   GFRNONAA >60 10/11/2014 1800   GFRAA >60 04/12/2020 0959   GFRAA >60 10/11/2014 1800    No results found for: "SPEP", "UPEP"  Lab Results  Component Value Date   WBC 9.6 10/27/2022  NEUTROABS 3.4 10/27/2022   HGB 11.5 (L) 10/27/2022   HCT 35.4 (L) 10/27/2022   MCV 104.4 (H) 10/27/2022   PLT 167 10/27/2022      Chemistry      Component Value Date/Time   NA 140 10/27/2022 0929   NA 143 03/11/2021 1136   NA 139 10/11/2014 1800   K 4.1 10/27/2022 0929   K 3.3 (L) 10/11/2014 1800   CL 107 10/27/2022 0929   CL 106 10/11/2014 1800   CO2 28 10/27/2022 0929   CO2 27 10/11/2014 1800   BUN 29 (H) 10/27/2022 0929   BUN 13 03/11/2021 1136   BUN 15 10/11/2014 1800   CREATININE 1.01 10/27/2022 0929   CREATININE 0.89 10/11/2014 1800      Component Value Date/Time   CALCIUM 8.9 10/27/2022 0929   CALCIUM 8.8 (L) 10/11/2014 1800   ALKPHOS 83 10/27/2022 0929   ALKPHOS 61 10/11/2014 1800   AST 18 10/27/2022 0929   ALT 17 10/27/2022 0929   ALT 22 10/11/2014 1800   BILITOT 0.6 10/27/2022 0929       RADIOGRAPHIC STUDIES: I have personally reviewed the radiological images as listed and  agreed with the findings in the report. No results found.   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  Patient currently on Acalabrutnib [AUG 16th, 2023]-  Patient admits to compliance; CT scan-  3rd, JAN 2024-  Interval improvement in enlarged lymph nodes in the neck bilaterally; improvement of the lymphadenopathy above and below the diaphragm.  Continue Acalbrutinib 100 mg BID.   # continue to Acalabrutinib 100 mg BID. Informed pharmacy. Will order scan today; in 2 months  # Mild Anemia Hb ~10-11- NOV 2023- iron sat- 17%; ferritin-71 continue gentle iron/slow iron. Monitor for now.   # Low immunoglobulins IgG 330/recent severe respiratory infection/CLL-s/pI IVIG #4/4 [last April 2022].MARCH 2024-- IgG > 1500.    # A.fib-on amiodarone [ Dr.Gollan] sinus rhythm-on Eliquis/CHF- lasix 40/d- s/p Cath [AUG, 2022- Echo-55%] -stable   #Hx C.Diff- colitis- s/p Vancomycin PO -monitor closely. Monitor closely. stable  # Left sided CVA- [s/p rehab; April 2023]- stable  # Joint pains/ Muscle pain- continue tramadol/CBD prn; refilled-stable  # Vaccination: s/p Flu shot.   DISPOSITION: #  follow up in 2 month-  MD; labs- cbc/cmp; LDH; prior- CT scan CAP; neck-- Dr.B    Orders Placed This Encounter  Procedures   CT CHEST ABDOMEN PELVIS W CONTRAST    Standing Status:   Future    Standing Expiration Date:   10/27/2023    Order Specific Question:   Preferred imaging location?    Answer:   [facility]    Order Specific Question:   Radiology Contrast Protocol - do NOT remove file path    Answer:   [email or web address]   CT SOFT TISSUE NECK W CONTRAST    Standing Status:   Future    Standing Expiration Date:   10/27/2023    Order Specific Question:   If indicated for the ordered procedure, I authorize the administration of contrast media per Radiology protocol    Answer:   Yes    Order  Specific Question:   Preferred imaging location?    Answer:   [facility]   All questions were answered. The patient knows to call the clinic with any problems, questions or concerns.     Earna Coder, MD 10/27/2022 11:55 AM

## 2022-10-27 NOTE — Progress Notes (Signed)
Patient is doing well overall.

## 2022-10-27 NOTE — Addendum Note (Signed)
Addended by: Darrold Span A on: 10/27/2022 12:23 PM   Modules accepted: Orders

## 2022-11-04 ENCOUNTER — Ambulatory Visit (INDEPENDENT_AMBULATORY_CARE_PROVIDER_SITE_OTHER): Payer: Medicare HMO

## 2022-11-04 ENCOUNTER — Telehealth: Payer: Self-pay | Admitting: Cardiovascular Disease

## 2022-11-04 DIAGNOSIS — I495 Sick sinus syndrome: Secondary | ICD-10-CM

## 2022-11-04 NOTE — Telephone Encounter (Signed)
Patient is calling because he needs his medical records and doctor notes from the past year faxed over to the [place] at [phone] with attn to [facility]. Patient stated that this needs to be sent over asap because his deadline is in a couple of days. Patient is requesting a call back as soon as possible regarding this matter. Please advise.

## 2022-11-05 LAB — CUP PACEART REMOTE DEVICE CHECK
Battery Remaining Longevity: 40 mo
Battery Voltage: 2.97 V
Brady Statistic AP VP Percent: 2.13 %
Brady Statistic AP VS Percent: 82.86 %
Brady Statistic AS VP Percent: 0.06 %
Brady Statistic AS VS Percent: 14.95 %
Brady Statistic RA Percent Paced: 85.01 %
Brady Statistic RV Percent Paced: 2.3 %
Date Time Interrogation Session: 20240416151759
Implantable Lead Connection Status: 753985
Implantable Lead Connection Status: 753985
Implantable Lead Implant Date: 20170814
Implantable Lead Implant Date: 20170814
Implantable Lead Location: 753859
Implantable Lead Location: 753860
Implantable Lead Model: 5076
Implantable Lead Model: 5076
Implantable Pulse Generator Implant Date: 20170814
Lead Channel Impedance Value: 323 Ohm
Lead Channel Impedance Value: 418 Ohm
Lead Channel Impedance Value: 475 Ohm
Lead Channel Impedance Value: 551 Ohm
Lead Channel Pacing Threshold Amplitude: 0.75 V
Lead Channel Pacing Threshold Amplitude: 0.75 V
Lead Channel Pacing Threshold Pulse Width: 0.4 ms
Lead Channel Pacing Threshold Pulse Width: 0.4 ms
Lead Channel Sensing Intrinsic Amplitude: 16.25 mV
Lead Channel Sensing Intrinsic Amplitude: 16.25 mV
Lead Channel Sensing Intrinsic Amplitude: 2.625 mV
Lead Channel Sensing Intrinsic Amplitude: 2.625 mV
Lead Channel Setting Pacing Amplitude: 2 V
Lead Channel Setting Pacing Amplitude: 2.5 V
Lead Channel Setting Pacing Pulse Width: 0.4 ms
Lead Channel Setting Sensing Sensitivity: 0.9 mV
Zone Setting Status: 755011
Zone Setting Status: 755011

## 2022-11-12 ENCOUNTER — Encounter: Payer: Self-pay | Admitting: Internal Medicine

## 2022-11-18 ENCOUNTER — Other Ambulatory Visit (HOSPITAL_COMMUNITY): Payer: Self-pay

## 2022-11-26 ENCOUNTER — Other Ambulatory Visit: Payer: Self-pay

## 2022-11-26 ENCOUNTER — Other Ambulatory Visit (HOSPITAL_COMMUNITY): Payer: Self-pay

## 2022-11-26 ENCOUNTER — Other Ambulatory Visit: Payer: Self-pay | Admitting: Internal Medicine

## 2022-11-26 DIAGNOSIS — C9110 Chronic lymphocytic leukemia of B-cell type not having achieved remission: Secondary | ICD-10-CM

## 2022-11-26 MED ORDER — CALQUENCE 100 MG PO TABS
100.0000 mg | ORAL_TABLET | Freq: Two times a day (BID) | ORAL | 2 refills | Status: DC
  Filled 2022-11-26: qty 60, 30d supply, fill #0
  Filled 2022-12-16: qty 60, 30d supply, fill #1
  Filled 2023-01-27 – 2023-01-29 (×2): qty 60, 30d supply, fill #2

## 2022-12-06 ENCOUNTER — Ambulatory Visit
Admission: EM | Admit: 2022-12-06 | Discharge: 2022-12-06 | Disposition: A | Discharge status: Home / Self Care | Payer: No Typology Code available for payment source | Attending: Family Medicine | Admitting: Family Medicine

## 2022-12-06 DIAGNOSIS — E860 Dehydration: Secondary | ICD-10-CM

## 2022-12-06 DIAGNOSIS — R531 Weakness: Secondary | ICD-10-CM | POA: Diagnosis not present

## 2022-12-06 DIAGNOSIS — Z1152 Encounter for screening for COVID-19: Secondary | ICD-10-CM | POA: Insufficient documentation

## 2022-12-06 DIAGNOSIS — Z8616 Personal history of COVID-19: Secondary | ICD-10-CM | POA: Insufficient documentation

## 2022-12-06 DIAGNOSIS — R112 Nausea with vomiting, unspecified: Secondary | ICD-10-CM

## 2022-12-06 LAB — COMPREHENSIVE METABOLIC PANEL
ALT: 19 U/L (ref 0–44)
AST: 22 U/L (ref 15–41)
Albumin: 4.2 g/dL (ref 3.5–5.0)
Alkaline Phosphatase: 76 U/L (ref 38–126)
Anion gap: 7 (ref 5–15)
BUN: 22 mg/dL (ref 8–23)
CO2: 22 mmol/L (ref 22–32)
Calcium: 8.5 mg/dL — ABNORMAL LOW (ref 8.9–10.3)
Chloride: 109 mmol/L (ref 98–111)
Creatinine, Ser: 1.01 mg/dL (ref 0.61–1.24)
GFR, Estimated: >60 mL/min (ref >60)
Glucose, Bld: 123 mg/dL — ABNORMAL HIGH (ref 70–99)
Potassium: 3.8 mmol/L (ref 3.5–5.1)
Sodium: 138 mmol/L (ref 135–145)
Total Bilirubin: 1.1 mg/dL (ref 0.3–1.2)
Total Protein: 7.4 g/dL (ref 6.5–8.1)

## 2022-12-06 LAB — CBC WITH DIFFERENTIAL/PLATELET
Abs Immature Granulocytes: 0.04 10*3/uL (ref 0.00–0.07)
Basophils Absolute: 0.1 10*3/uL (ref 0.0–0.1)
Basophils Relative: 1 %
Eosinophils Absolute: 0.1 10*3/uL (ref 0.0–0.5)
Eosinophils Relative: 1 %
HCT: 35.2 % — ABNORMAL LOW (ref 39.0–52.0)
Hemoglobin: 11.7 g/dL — ABNORMAL LOW (ref 13.0–17.0)
Immature Granulocytes: 0 %
Lymphocytes Relative: 60 %
Lymphs Abs: 7.6 10*3/uL — ABNORMAL HIGH (ref 0.7–4.0)
MCH: 34.4 pg — ABNORMAL HIGH (ref 26.0–34.0)
MCHC: 33.2 g/dL (ref 30.0–36.0)
MCV: 103.5 fL — ABNORMAL HIGH (ref 80.0–100.0)
Monocytes Absolute: 0.6 10*3/uL (ref 0.1–1.0)
Monocytes Relative: 5 %
Neutro Abs: 4.2 10*3/uL (ref 1.7–7.7)
Neutrophils Relative %: 33 %
Platelets: 165 10*3/uL (ref 150–400)
RBC: 3.4 MIL/uL — ABNORMAL LOW (ref 4.22–5.81)
RDW: 14.4 % (ref 11.5–15.5)
WBC Morphology: ABNORMAL
WBC: 12.6 10*3/uL — ABNORMAL HIGH (ref 4.0–10.5)
nRBC: 0 % (ref 0.0–0.2)

## 2022-12-06 LAB — SARS CORONAVIRUS 2 BY RT PCR: SARS Coronavirus 2 by RT PCR: NEGATIVE

## 2022-12-06 MED ORDER — SODIUM CHLORIDE 0.9 % IV BOLUS
1000.0000 mL | Freq: Once | INTRAVENOUS | Status: AC
  Administered 2022-12-06: 1000 mL via INTRAVENOUS

## 2022-12-06 MED ORDER — ONDANSETRON HCL 4 MG/2ML IJ SOLN: 8.0000 mg | Freq: Once | INTRAMUSCULAR | Status: DC

## 2022-12-06 MED ORDER — ONDANSETRON 8 MG PO TBDP: 8.0000 mg | ORAL_TABLET | Freq: Three times a day (TID) | ORAL | 0 refills | Status: DC | PRN

## 2022-12-06 MED ORDER — ONDANSETRON HCL 4 MG/2ML IJ SOLN
4.0000 mg | Freq: Once | INTRAMUSCULAR | Status: AC
  Administered 2022-12-06: 4 mg via INTRAVENOUS

## 2022-12-06 NOTE — ED Triage Notes (Signed)
Pt c/o emesis,chills & fatigue x3 days. States he tested + for covid last night. No otc tx. Denies any fevers. Wife states the night before pt had projectile vomiting & last night dry heaves. Currently on cancer tx for leukemia.

## 2022-12-06 NOTE — ED Provider Notes (Signed)
[facility]    CSN: [id number] Arrival date & time: 12/06/22  0835      History   Chief Complaint Chief Complaint  Patient presents with   Covid Positive   Emesis   Chills   Fatigue    HPI [name] is a 77 y.o. male.   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  He tested positive for COVID last night.  He denies any fevers.  Wife is reporting that 2 nights ago patient had projectile vomiting and then dry heaves last night.  Patient's largest complaint is weakness.  Wife reports that he is able to do his ADLs with assistance.  Past Medical History:  Diagnosis Date   Anxiety    Arthritis    Atrial fibrillation (HCC)    a. Dx 2013, recurred 02/2014, CHA2DS2VASc = 3 -->placed on Eliquis;  b. 02/2014 Echo: EF 50-55%, mid and apical anterior septum and mid and apical inf septum are abnl, mild to mod Ao sclerosis w/o AS.   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 01/2021 LV Gram: EF 35-45%; b. 02/2021 Echo: EF 55-60%, no rwma, GrI DD, mildly reduced RV fxn, triv MR, mild-mod AoV sclerosis w/o stenosis.   Chronic lymphocytic leukemia (HCC)    a. Dx 02/2014.   Complication of anesthesia    History of  PTSD--do not touch patient when waking up from surgery.   COPD (chronic obstructive pulmonary disease) (HCC)    Coronary artery disease    a. 04/2009 CABG x 3 (LIMA->LAD, VG->OM1, VG->PDA);  b. 09/2009 Cath: occluded VG x 2 w/ patent LIMA and L->R collats. EF 55%, mild antlat HK;  c. 01/2021 Cath: LM mild diff dzs, LAD 100p, LCX nl, RCA 50p, 95d, 99 side branch, RPAV fills via LCX. VG->RPDA 100, VG->OM1 100, LIMA->LAD nl. EF 35-45%.   GERD (gastroesophageal reflux disease)    occasional   History of chemotherapy 2015-2016   HOH (hard of hearing)    Bilateral Hearing Aids   Hyperlipidemia LDL goal <70     Hypertension    Ischemic cardiomyopathy    a. 01/2021 LV Gram: EF 35-45%; b. 02/2021 Echo: EF 55-60%.   Myocardial infarction (HCC) 2010   OSA on CPAP    USE C-PAP   PTSD (post-traumatic stress disorder)    Rheumatic fever 1959   SSS (sick sinus syndrome) (HCC)    a. 02/2016 s/p MDT MRI compatible DC PPM (ser# [id number]).   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   Sensorineural hearing loss, 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   (HFpEF) heart failure with preserved ejection fraction (HCC) 06/30/2022   Abnormal INR 06/30/2022   Anxiety 06/30/2022   GERD (gastroesophageal reflux disease) 06/30/2022   History of CVA (cerebrovascular accident) 06/30/2022   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/30/2022   Anemia 06/02/2022   Sialadenitis 04/17/2022   Pharyngitis 04/15/2022   Clostridioides difficile diarrhea 01/27/2022   Dysphagia 01/03/2022   Lymphadenopathy 01/03/2022   Chronic neck pain 01/03/2022   Hearing loss 12/23/2021   Other reduced mobility 12/23/2021   Personal history of  tobacco use, presenting hazards to health 12/23/2021   Polyp of colon 12/23/2021   Tooth disorder 12/23/2021   Neck pain 12/23/2021   Neck nodule 11/11/2021   Left-sided weakness 10/18/2021   CVA (cerebral vascular accident) (HCC) 10/18/2021   Elevated troponin 09/13/2021   Fall at home, initial encounter 09/13/2021   AKI (acute kidney injury) (HCC) 09/13/2021   Weakness 09/04/2021   COVID-19 virus infection 09/04/2021   Accelerating angina (HCC) 02/04/2021   Abnormal stress test    Cardiomyopathy [facility])    Chronic rhinitis 07/17/2020   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   CAD (coronary artery disease)    Depression    Arthritis 09/16/2019    Dehydration 03/14/2019   Sick sinus syndrome (HCC) 07/08/2016   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   Chronic fatigue    Thrombocytopenia (HCC) 02/29/2016   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    PAD (peripheral artery disease) (HCC)    OSA on CPAP    Chronic diastolic CHF (congestive heart failure) (HCC) 09/20/2015   CLL (chronic lymphocytic leukemia) (HCC)    PTSD (post-traumatic stress disorder)    Osteoarthritis of both knees 07/05/2015   COPD (chronic obstructive pulmonary disease) (HCC) 01/01/2012   Paroxysmal atrial fibrillation (HCC) 10/09/2011   Hyperlipidemia 11/28/2009   Essential hypertension 11/28/2009    Past Surgical History:  Procedure Laterality Date   ABDOMINAL HERNIA REPAIR     APPENDECTOMY  06/21/1985   CARDIAC CATHETERIZATION  2010; 2011   ; Dr Kirke Corin   CORONARY ARTERY BYPASS GRAFT  04/2009   "CABG X3"   EP IMPLANTABLE DEVICE N/A 03/03/2016   Procedure: Pacemaker Implant;  Surgeon: Duke Salvia, MD;  Location: [facility] INVASIVE CV LAB;  Service: Cardiovascular;  Laterality: N/A;   FOREIGN BODY REMOVAL  1968   "shrapnel in my tailbone"   INGUINAL HERNIA REPAIR Right    INSERT / REPLACE / REMOVE PACEMAKER     JOINT REPLACEMENT Right 2018   LAPAROSCOPIC CHOLECYSTECTOMY     RIGHT/LEFT HEART CATH AND CORONARY/GRAFT ANGIOGRAPHY N/A 02/04/2021   Procedure: RIGHT/LEFT HEART CATH AND CORONARY/GRAFT ANGIOGRAPHY;  Surgeon: Yvonne Kendall, MD;  Location: [facility] INVASIVE CV LAB;  Service: Cardiovascular;  Laterality: N/A;   TONSILLECTOMY AND ADENOIDECTOMY  1956   TOTAL HIP ARTHROPLASTY Right 10/22/2016   Procedure: TOTAL HIP ARTHROPLASTY;  Surgeon: Donato Heinz, MD;  Location: [facility] ORS;  Service: Orthopedics;  Laterality: Right;   TOTAL HIP ARTHROPLASTY Left 11/04/2017   Procedure: TOTAL HIP ARTHROPLASTY;  Surgeon: Donato Heinz, MD;  Location: [facility] ORS;  Service: Orthopedics;  Laterality: Left;       Home Medications    Prior to  Admission medications   Medication Sig Start Date End Date Taking? Authorizing Provider  acalabrutinib maleate (CALQUENCE) 100 MG tablet Take 1 tablet (100 mg total) by mouth 2 (two) times daily. 11/26/22  Yes Earna Coder, MD  acetaminophen (TYLENOL) 325 MG tablet Take 1-2 tablets (325-650 mg total) by mouth every 4 (four) hours as needed for mild pain. 11/01/21  Yes Setzer, Lynnell Jude, PA-C  albuterol (VENTOLIN HFA) 108 (90 Base) MCG/ACT inhaler Inhale 2 puffs into the lungs every 6 (six) hours as needed for wheezing or shortness of breath. 11/01/21  Yes Setzer, Lynnell Jude, PA-C  amiodarone (PACERONE) 200 MG tablet Take 1/2 tablet (100 mg) by mouth twice daily 5 days a week 04/14/22  Yes Duke Salvia, MD  apixaban (ELIQUIS) 5 MG TABS tablet Take  5 mg by mouth 2 (two) times daily.   Yes [provider]  azelastine (ASTELIN) 0.1 % nasal spray Place 2 sprays into both nostrils 2 (two) times daily. Use in each nostril as directed 11/01/21  Yes Setzer, Lynnell Jude, PA-C  cetirizine (ZYRTEC) 10 MG tablet Take 10 mg by mouth daily as needed for allergies.    Yes [provider]  Coenzyme Q10 (COQ10) 200 MG CAPS Take 200 mg by mouth daily.   Yes [provider]  diphenoxylate-atropine (LOMOTIL) 2.5-0.025 MG tablet Take by mouth 4 (four) times daily as needed for diarrhea or loose stools.   Yes [provider]  Evolocumab (REPATHA SURECLICK) 140 MG/ML SOAJ Inject 140 mg into the skin every 14 (fourteen) days. 10/10/22  Yes Antonieta Iba, MD  ezetimibe (ZETIA) 10 MG tablet Take 1 tablet (10 mg total) by mouth daily. 01/06/17 11/01/27 Yes Gollan, Tollie Pizza, MD  ferrous sulfate 325 (65 FE) MG EC tablet Take 325 mg by mouth 3 (three) times daily with meals.   Yes [provider]  furosemide (LASIX) 20 MG tablet Take 10 mg by mouth daily.   Yes [provider]  gabapentin (NEURONTIN) 300 MG capsule Take by mouth.   Yes [provider]  Boris Lown Oil 350  MG CAPS Take 350 mg by mouth daily as needed (Heart).   Yes [provider]  mirtazapine (REMERON) 15 MG tablet Take 15 mg by mouth at bedtime as needed (for panic associated with PTSD).    Yes [provider]  Multiple Vitamin (MULTIVITAMIN WITH MINERALS) TABS tablet Take 1 tablet by mouth daily.   Yes [provider]  nitroGLYCERIN (NITROSTAT) 0.4 MG SL tablet Place 1 tablet (0.4 mg total) under the tongue every 5 (five) minutes as needed for chest pain. 01/06/17  Yes Antonieta Iba, MD  ondansetron (ZOFRAN-ODT) 8 MG disintegrating tablet Take 1 tablet (8 mg total) by mouth every 8 (eight) hours as needed for nausea or vomiting. 12/06/22  Yes Becky Augusta, NP  Tiotropium Bromide Monohydrate 2.5 MCG/ACT AERS Inhale 2 puffs into the lungs at bedtime. Spiriva 08/07/20  Yes [provider]  traMADol (ULTRAM) 50 MG tablet Take 1 tablet (50 mg total) by mouth every 12 (twelve) hours as needed. 01/03/22  Yes McLean-Scocuzza, Pasty Spillers, MD  acyclovir (ZOVIRAX) 400 MG tablet TAKE 1 TABLET(400 MG) BY MOUTH TWICE DAILY Patient not taking: Reported on 10/07/2022 08/26/21   Earna Coder, MD  benzonatate (TESSALON) 100 MG capsule Take 1 capsule (100 mg total) by mouth 3 (three) times daily as needed for cough. Patient not taking: Reported on 10/07/2022 09/26/22   Earna Coder, MD  budesonide-formoterol [facility]) 160-4.5 MCG/ACT inhaler Inhale 2 puffs into the lungs 2 (two) times daily. Patient not taking: Reported on 10/07/2022 12/01/16   Merwyn Katos, MD  cephALEXin (KEFLEX) 500 MG capsule Take 1 capsule (500 mg total) by mouth 3 (three) times daily. Patient not taking: Reported on 10/07/2022 09/26/22   Earna Coder, MD    Family History Family History  Problem Relation Age of Onset   Heart disease Mother    Heart attack Mother    Coronary artery disease Other        family history    Social History Social History   Tobacco Use   Smoking  status: Former    Packs/day: 1.00    Years: 40.00    Additional pack years: 0.00    Total pack years:  40.00    Types: Cigarettes    Quit date: 07/21/2006    Years since quitting: 16.3   Smokeless tobacco: Never  Vaping Use   Vaping Use: Former  Substance Use Topics   Alcohol use: Not Currently   Drug use: No     Allergies   Augmentin [amoxicillin-pot clavulanate]   Review of Systems Review of Systems  Constitutional:  Positive for chills and fatigue. Negative for fever.  HENT:  Positive for congestion and postnasal drip. Negative for ear pain and sore throat.   Respiratory:  Positive for cough. Negative for shortness of breath and wheezing.        Patient's cough is dry and infrequent.  He does have a history of COPD and has shortness of breath at baseline but there has been no increase.  Gastrointestinal:  Positive for nausea and vomiting. Negative for abdominal pain and diarrhea.  Neurological:  Positive for dizziness. Negative for headaches.  Hematological: Negative.   Psychiatric/Behavioral: Negative.       Physical Exam Triage Vital Signs ED Triage Vitals  Enc Vitals Group     BP 12/06/22 0842 116/70     Pulse Rate 12/06/22 0842 65     Resp 12/06/22 0842 16     Temp 12/06/22 0842 98.2 F (36.8 C)     Temp Source 12/06/22 0842 Oral     SpO2 12/06/22 0842 96 %     Weight 12/06/22 0842 192 lb (87.1 kg)     Height 12/06/22 0842 5' 8.5" (1.74 m)     Head Circumference --      Peak Flow --      Pain Score 12/06/22 0846 0     Pain Loc --      Pain Edu? --      Excl. in GC? --    No data found.  Updated Vital Signs BP 116/70 (BP Location: Right Arm)   Pulse 65   Temp 98.2 F (36.8 C) (Oral)   Resp 16   Ht 5' 8.5" (1.74 m)   Wt 192 lb (87.1 kg)   SpO2 96%   BMI 28.77 kg/m   Visual Acuity Right Eye Distance:   Left Eye Distance:   Bilateral Distance:    Right Eye Near:   Left Eye Near:    Bilateral Near:     Physical Exam Vitals and nursing note  reviewed.  Constitutional:      Appearance: Normal appearance. He is ill-appearing.  HENT:     Head: Normocephalic and atraumatic.     Right Ear: Tympanic membrane, ear canal and external ear normal. There is no impacted cerumen.     Left Ear: Tympanic membrane, ear canal and external ear normal. There is no impacted cerumen.     Nose: Congestion and rhinorrhea present.     Comments: Patient mucosa is erythematous and edematous with clear discharge in both nares.    Mouth/Throat:     Mouth: Mucous membranes are moist.     Pharynx: Oropharynx is clear. Posterior oropharyngeal erythema present. No oropharyngeal exudate.     Comments: Patient does have a small area of erythema on his right upper gum underneath his denture plate.  There is no discharge or swelling. Cardiovascular:     Rate and Rhythm: Normal rate and regular rhythm.     Pulses: Normal pulses.     Heart sounds: Normal heart sounds. No murmur heard.    No friction rub. No gallop.  Pulmonary:  Effort: Pulmonary effort is normal.     Breath sounds: Normal breath sounds. No wheezing, rhonchi or rales.  Musculoskeletal:     Cervical back: Normal range of motion and neck supple.  Lymphadenopathy:     Cervical: No cervical adenopathy.  Skin:    General: Skin is warm and dry.     Capillary Refill: Capillary refill takes less than 2 seconds.     Findings: Bruising present.     Comments: Patient has bruising on the inner aspect of his left knee into circular patterns as well as scattered bruising on his extremities.  Patient is on apixaban.  Neurological:     General: No focal deficit present.     Mental Status: He is alert and oriented to person, place, and time.      UC Treatments / Results  Labs (all labs ordered are listed, but only abnormal results are displayed) Labs Reviewed  CBC WITH DIFFERENTIAL/PLATELET - Abnormal; Notable for the following components:      Result Value   WBC 12.6 (*)    RBC 3.40 (*)     Hemoglobin 11.7 (*)    HCT 35.2 (*)    MCV 103.5 (*)    MCH 34.4 (*)    Lymphs Abs 7.6 (*)    All other components within normal limits  COMPREHENSIVE METABOLIC PANEL - Abnormal; Notable for the following components:   Glucose, Bld 123 (*)    Calcium 8.5 (*)    All other components within normal limits  SARS CORONAVIRUS 2 BY RT PCR    EKG   Radiology No results found.  Procedures Procedures (including critical care time)  Medications Ordered in UC Medications  sodium chloride 0.9 % bolus 1,000 mL (1,000 mLs Intravenous New Bag/Given 12/06/22 1005)  ondansetron (ZOFRAN) injection 4 mg (4 mg Intravenous Given 12/06/22 1004)    Initial Impression / Assessment and Plan / UC Course  I have reviewed the triage vital signs and the nursing notes.  Pertinent labs & imaging results that were available during my care of the patient were reviewed by me and considered in my medical decision making (see chart for details).   Patient is a pleasant 77 year old male who does demonstrate weakness in the exam room.  He was unsteady on his feet when he stood up to show me a bruise on his thigh and then had to sit on the step before being assisted back onto the exam table.  Patient's wife reports that this fatigue has been going on all week and then 3 days ago he developed chills and vomiting.  She reports projectile vomiting on the first day and then dry heaves last night.  He remains nauseous but he is able to eat and drink.  He took a home COVID test which was positive.  The patient reports that before he goes to vomit and occasionally when he gets up in the morning he does have some room spinning.  It is not present now.  The patient does have a history of vertigo.  No complaints of pain or ringing in the ears.  Tympanic membrane's are pearly gray bilaterally and no effusions are noted on exam.  Patient is mildly decreased skin turgor and sticky oral mucous membranes.  I do suspect that he has some  mild dehydration secondary to age and also the vomiting he has been experiencing.  A COVID PCR was collected at triage.  I will order an IV to be placed and patient received  a 500 mL of normal saline as a bolus given the patient has a history of heart failure with preserved ejection fraction.  I will check CBC and CMP to look for the presence of electrolyte abnormalities.  I will also order IV Zofran to help the patient with nausea.  CMP from 10/27/2022 shows a BUN of 29 with a creatinine of 1.01 and a GFR of greater than 60.  I advised patient that if he continues to feel as weak as he is now he may need to go to the emergency department for evaluation.  We will reassess following IV fluid bolus.  COVID PCR is negative.  Patient is received 500 mL of normal saline and he reports that he is feeling better.  Auscultation of the lung fields continue to reflect clear lung sounds.  I will have the staff administer the remaining 500 mL.  CBC shows an elevated WBC count of 12.6.  This is an interval change from 10/27/2022 when his WBC count was 9.6.  RBC count, H&H, MCV, and MCH are largely unchanged from historical trends going back through August 2023.  Differential shows a mildly elevated lymphocyte number of 7.6 with no other abnormalities.  CMP shows normal electrolytes and renal function.  Transaminases are also unremarkable.  Patient's calcium is mildly decreased at 8.5 and his glucose is mildly elevated at 123 though he did eat this morning.  I will discharge patient home with a diagnosis of viral GI illness and prescribe Zofran that he can use every 8 hours at home as needed for nausea and vomiting.  If he has increased vomiting and then is unable to keep down fluids or meds medication he should present to the ER for evaluation.  Also if he develops worsening weakness.   Final Clinical Impressions(s) / UC Diagnoses   Final diagnoses:  Nausea and vomiting, unspecified vomiting type  Weakness   Dehydration     Discharge Instructions      Take the Zofran every 8 hours as needed for nausea and vomiting.  They are an oral disintegrating tablet and you can place them on her under your tongue and then will be absorbed.  Follow a clear liquid diet for the next 6 to 12 hours.  Clear liquids consist of broth, ginger ale, water, Pedialyte, and Jell-O.  After 6 to 12 hours, if you are tolerating clear liquids, you can advance to bland foods such as bananas, rice, applesauce, and toast.  If you tolerate bland foods you can continue to advance your diet as you see fit.  If you develop a fever over 100.5, increased abdominal pain, bloody vomit, or bloody stool return for reevaluation or go to the ER.  Also if you develop increased weakness please go to the ER for evaluation.     ED Prescriptions     Medication Sig Dispense Auth. Provider   ondansetron (ZOFRAN-ODT) 8 MG disintegrating tablet Take 1 tablet (8 mg total) by mouth every 8 (eight) hours as needed for nausea or vomiting. 20 tablet Becky Augusta, NP      PDMP not reviewed this encounter.   Becky Augusta, NP 12/06/22 [phone]

## 2022-12-06 NOTE — Discharge Instructions (Addendum)
Take the Zofran every 8 hours as needed for nausea and vomiting.  They are an oral disintegrating tablet and you can place them on her under your tongue and then will be absorbed.  Follow a clear liquid diet for the next 6 to 12 hours.  Clear liquids consist of broth, ginger ale, water, Pedialyte, and Jell-O.  After 6 to 12 hours, if you are tolerating clear liquids, you can advance to bland foods such as bananas, rice, applesauce, and toast.  If you tolerate bland foods you can continue to advance your diet as you see fit.  If you develop a fever over 100.5, increased abdominal pain, bloody vomit, or bloody stool return for reevaluation or go to the ER.  Also if you develop increased weakness please go to the ER for evaluation.

## 2022-12-08 DIAGNOSIS — R5383 Other fatigue: Secondary | ICD-10-CM | POA: Insufficient documentation

## 2022-12-08 NOTE — Progress Notes (Signed)
Remote pacemaker transmission.   

## 2022-12-09 ENCOUNTER — Telehealth: Payer: Self-pay | Admitting: *Deleted

## 2022-12-09 NOTE — Telephone Encounter (Signed)
Wife called reporting that patient is in hospital for past 3 days and has not had his ca medicine for 3 days and wanted to know if that is ok. He was admitted to [facility] for n/v and needing a CT with his pacemaker. He was supposed to be discharged today but started vomiting again so he had to stay. I told her that that if they felt that he didn't need right now that he should be be fine and can restart it after he gets home

## 2022-12-10 ENCOUNTER — Telehealth: Payer: Self-pay | Admitting: *Deleted

## 2022-12-10 ENCOUNTER — Encounter: Payer: Self-pay | Admitting: Internal Medicine

## 2022-12-10 NOTE — Telephone Encounter (Signed)
Marland Kitchen called again today and states patient is still in hospital and unable to take his cancer medicine She is asking if his cancer may be causing his n/v and if he needs his medicine Dr B Please call her to discuss

## 2022-12-11 DIAGNOSIS — I10 Essential (primary) hypertension: Secondary | ICD-10-CM | POA: Insufficient documentation

## 2022-12-12 ENCOUNTER — Telehealth: Payer: Self-pay | Admitting: Internal Medicine

## 2022-12-12 ENCOUNTER — Telehealth: Payer: Self-pay

## 2022-12-12 NOTE — Transitions of Care (Post Inpatient/ED Visit) (Unsigned)
   12/12/2022  Name: [name] MRN: [id number] DOB: May 08, 1946  Today's TOC FU Call Status: Today's TOC FU Call Status:: Unsuccessul Call (1st Attempt) Unsuccessful Call (1st Attempt) Date: 12/12/22  Attempted to reach the patient regarding the most recent Inpatient/ED visit.  Follow Up Plan: Additional outreach attempts will be made to reach the patient to complete the Transitions of Care (Post Inpatient/ED visit) call.   Signature Karena Addison, LPN [facility] Nurse Health Advisor Direct Dial [phone]

## 2022-12-12 NOTE — Telephone Encounter (Signed)
Spoke to patient regarding his recent admission to the hospital.  Patient restarted back on his cancer pain.  Will get scans and follow-up as planned in the next 1 to 2 weeks.

## 2022-12-16 ENCOUNTER — Other Ambulatory Visit: Payer: Self-pay

## 2022-12-16 ENCOUNTER — Encounter: Payer: Self-pay | Admitting: Internal Medicine

## 2022-12-16 NOTE — Transitions of Care (Post Inpatient/ED Visit) (Signed)
12/16/2022  Name: [name] MRN: [id number] DOB: September 06, 1945  Today's TOC FU Call Status: Today's TOC FU Call Status:: Successful TOC FU Call Competed Unsuccessful Call (1st Attempt) Date: 12/12/22 [facility] FU Call Complete Date: 12/16/22  Transition Care Management Follow-up Telephone Call Date of Discharge: 12/11/22 Discharge Facility: Other ([facility]) Name of Other ([facility]) Discharge Facility: [facility] Type of Discharge: Inpatient Admission Primary Inpatient Discharge Diagnosis:: ataxia How have you been since you were released from the hospital?: Better Any questions or concerns?: No  Items Reviewed: Did you receive and understand the discharge instructions provided?: Yes Medications obtained,verified, and reconciled?: Yes (Medications Reviewed) Any new allergies since your discharge?: No Dietary orders reviewed?: Yes Do you have support at home?: Yes People in Home: spouse  Medications Reviewed Today: Medications Reviewed Today     Reviewed by Karena Addison, LPN (Licensed Practical Nurse) on 12/16/22 at 1634  Med List Status: <None>   Medication Order Taking? Sig Documenting Provider Last Dose Status Informant  acalabrutinib maleate (CALQUENCE) 100 MG tablet [id number] Yes Take 1 tablet (100 mg total) by mouth 2 (two) times daily. Earna Coder, MD Taking Active   acetaminophen (TYLENOL) 325 MG tablet [id number] Yes Take 1-2 tablets (325-650 mg total) by mouth every 4 (four) hours as needed for mild pain. Setzer, Lynnell Jude, PA-C Taking Active   acyclovir (ZOVIRAX) 400 MG tablet [id number] No TAKE 1 TABLET(400 MG) BY MOUTH TWICE DAILY  Patient not taking: Reported on 10/07/2022   Earna Coder, MD Not Taking Active Spouse/Significant Other  albuterol (VENTOLIN HFA) 108 (90 Base) MCG/ACT inhaler [id number] Yes Inhale 2 puffs into the lungs every 6 (six) hours as needed for wheezing or shortness of breath. Setzer, Lynnell Jude, PA-C Taking Active    amiodarone (PACERONE) 200 MG tablet [id number] Yes Take 1/2 tablet (100 mg) by mouth twice daily 5 days a week Duke Salvia, MD Taking Active   apixaban (ELIQUIS) 5 MG TABS tablet [id number] Yes Take 5 mg by mouth 2 (two) times daily. [provider] Taking Active Spouse/Significant Other  azelastine (ASTELIN) 0.1 % nasal spray [id number] Yes Place 2 sprays into both nostrils 2 (two) times daily. Use in each nostril as directed Milinda Antis, PA-C Taking Active   benzonatate (TESSALON) 100 MG capsule [id number] Yes Take 1 capsule (100 mg total) by mouth 3 (three) times daily as needed for cough. Earna Coder, MD Taking Active   budesonide-formoterol [facility]) 160-4.5 MCG/ACT inhaler [id number] Yes Inhale 2 puffs into the lungs 2 (two) times daily. Merwyn Katos, MD Taking Active Spouse/Significant Other  cephALEXin (KEFLEX) 500 MG capsule [id number] Yes Take 1 capsule (500 mg total) by mouth 3 (three) times daily. Earna Coder, MD Taking Active   cetirizine (ZYRTEC) 10 MG tablet [id number] Yes Take 10 mg by mouth daily as needed for allergies.  [provider] Taking Active Spouse/Significant Other  Coenzyme Q10 (COQ10) 200 MG CAPS [id number] Yes Take 200 mg by mouth daily. [provider] Taking Active Spouse/Significant Other  diphenoxylate-atropine (LOMOTIL) 2.5-0.025 MG tablet [id number] Yes Take by mouth 4 (four) times daily as needed for diarrhea or loose stools. [provider] Taking Active   Evolocumab [facility] SURECLICK) 140 MG/ML Ivory Broad [id number] Yes Inject 140 mg into the skin every 14 (fourteen) days. Antonieta Iba, MD Taking Active   ezetimibe (ZETIA) 10 MG tablet [id number] Yes Take 1 tablet (10 mg total) by mouth daily. Antonieta Iba, MD Taking Active Spouse/Significant Other  ferrous sulfate 325 (65 FE) MG EC tablet [id number] Yes Take 325 mg by mouth 3 (three) times daily with meals. [provider] Taking Active    furosemide (LASIX) 20 MG tablet [id number] Yes Take 10 mg by mouth daily. [provider] Taking Active   gabapentin (NEURONTIN) 300 MG capsule [id number] Yes Take by mouth. [provider] Taking Active   Krill Oil 350 MG CAPS [id number] Yes Take 350 mg by mouth daily as needed (Heart). [provider] Taking Active Spouse/Significant Other           Med Note Tomi Likens   Wed Jan 06, 2018 10:21 AM)    mirtazapine (REMERON) 15 MG tablet [id number] Yes Take 15 mg by mouth at bedtime as needed (for panic associated with PTSD).  [provider] Taking Active Spouse/Significant Other  Multiple Vitamin (MULTIVITAMIN WITH MINERALS) TABS tablet [id number] Yes Take 1 tablet by mouth daily. [provider] Taking Active Spouse/Significant Other  nitroGLYCERIN (NITROSTAT) 0.4 MG SL tablet [id number] Yes Place 1 tablet (0.4 mg total) under the tongue every 5 (five) minutes as needed for chest pain. Antonieta Iba, MD Taking Active Spouse/Significant Other  ondansetron (ZOFRAN-ODT) 8 MG disintegrating tablet [id number] Yes Take 1 tablet (8 mg total) by mouth every 8 (eight) hours as needed for nausea or vomiting. Becky Augusta, NP Taking Active   pantoprazole (PROTONIX) 40 MG tablet [id number] Yes Take 40 mg by mouth daily. [provider] Taking Active   tamsulosin (FLOMAX) 0.4 MG CAPS capsule [id number] Yes Take 0.4 mg by mouth daily. [provider] Taking Active   Tiotropium Bromide Monohydrate 2.5 MCG/ACT AERS [id number] Yes Inhale 2 puffs into the lungs at bedtime. Spiriva [provider] Taking Active Spouse/Significant Other  traMADol (ULTRAM) 50 MG tablet [id number] Yes Take 1 tablet (50 mg total) by mouth every 12 (twelve) hours as needed. McLean-Scocuzza, Pasty Spillers, MD Taking Active   Med List Note Remi Haggard, RPH-CPP 08/28/22 1057): Pt also uses the [facility] in [place], [place].   Calquence filled at [facility] (Specialty)             Home Care and Equipment/Supplies: Were Home Health Services Ordered?: NA Any new equipment or medical supplies ordered?: NA  Functional Questionnaire: Do you need assistance with bathing/showering or dressing?: No Do you need assistance with meal preparation?: No Do you need assistance with eating?: No Do you have difficulty maintaining continence: No Do you need assistance with getting out of bed/getting out of a chair/moving?: No Do you have difficulty managing or taking your medications?: No  Follow up appointments reviewed: PCP Follow-up appointment confirmed?: Yes Date of PCP follow-up appointment?: 01/09/23 Follow-up Provider: Dr [facility] Follow-up appointment confirmed?: Yes Date of Specialist follow-up appointment?: 12/25/22 Follow-Up Specialty Provider:: on[facility] Do you need transportation to your follow-up appointment?: No Do you understand care options if your condition(s) worsen?: Yes-patient verbalized understanding    SIGNATURE Karena Addison, LPN [facility] Nurse Health Advisor Direct Dial [phone]

## 2022-12-17 NOTE — Telephone Encounter (Signed)
Reviewed

## 2022-12-19 ENCOUNTER — Telehealth: Payer: Self-pay | Admitting: Family Medicine

## 2022-12-19 NOTE — Telephone Encounter (Signed)
Irving Burton from [facility] called stating pt pain is eight out of ten, but pt stated its chronic pain, so Irving Burton wants to know if they should update the parameter because he is always at that pain level

## 2022-12-22 ENCOUNTER — Ambulatory Visit: Admission: RE | Admit: 2022-12-22 | Payer: No Typology Code available for payment source | Source: Ambulatory Visit

## 2022-12-22 ENCOUNTER — Ambulatory Visit
Admission: RE | Admit: 2022-12-22 | Discharge: 2022-12-22 | Disposition: A | Discharge status: Home / Self Care | Payer: Medicare HMO | Source: Ambulatory Visit | Attending: Internal Medicine | Admitting: Internal Medicine

## 2022-12-22 DIAGNOSIS — C9110 Chronic lymphocytic leukemia of B-cell type not having achieved remission: Secondary | ICD-10-CM | POA: Insufficient documentation

## 2022-12-22 MED ORDER — IOHEXOL 300 MG/ML  SOLN
100.0000 mL | Freq: Once | INTRAMUSCULAR | Status: AC | PRN
  Administered 2022-12-22: 100 mL via INTRAVENOUS

## 2022-12-22 NOTE — Telephone Encounter (Signed)
Noted  

## 2022-12-22 NOTE — Telephone Encounter (Signed)
Irving Burton from [facility] states the Patient told her he has chronic pain from Arthritis and it is both legs that is bothering him the most right now. Irving Burton states they will update the chart that if there is new or worsening pain to make Dr. Birdie Sons aware.

## 2022-12-22 NOTE — Telephone Encounter (Signed)
Can you find out why his pain is 8/10? What kind of pain is it? Where is the pain located? I do not have a parameter for pain, though would say if he has new or worsening pain we should be informed about it.

## 2022-12-24 ENCOUNTER — Other Ambulatory Visit (HOSPITAL_COMMUNITY): Payer: Self-pay

## 2022-12-26 ENCOUNTER — Inpatient Hospital Stay (HOSPITAL_BASED_OUTPATIENT_CLINIC_OR_DEPARTMENT_OTHER): Payer: Medicare HMO | Admitting: Internal Medicine

## 2022-12-26 ENCOUNTER — Inpatient Hospital Stay: Payer: Medicare HMO | Attending: Internal Medicine

## 2022-12-26 ENCOUNTER — Encounter: Payer: Self-pay | Admitting: Internal Medicine

## 2022-12-26 VITALS — BP 140/64 | HR 65 | Ht 68.5 in | Wt 198.6 lb

## 2022-12-26 DIAGNOSIS — D649 Anemia, unspecified: Secondary | ICD-10-CM | POA: Insufficient documentation

## 2022-12-26 DIAGNOSIS — C9110 Chronic lymphocytic leukemia of B-cell type not having achieved remission: Secondary | ICD-10-CM | POA: Diagnosis present

## 2022-12-26 DIAGNOSIS — Z79899 Other long term (current) drug therapy: Secondary | ICD-10-CM | POA: Diagnosis not present

## 2022-12-26 DIAGNOSIS — Z8673 Personal history of transient ischemic attack (TIA), and cerebral infarction without residual deficits: Secondary | ICD-10-CM | POA: Insufficient documentation

## 2022-12-26 DIAGNOSIS — D801 Nonfamilial hypogammaglobulinemia: Secondary | ICD-10-CM | POA: Diagnosis not present

## 2022-12-26 DIAGNOSIS — M2550 Pain in unspecified joint: Secondary | ICD-10-CM | POA: Insufficient documentation

## 2022-12-26 DIAGNOSIS — I4891 Unspecified atrial fibrillation: Secondary | ICD-10-CM | POA: Insufficient documentation

## 2022-12-26 DIAGNOSIS — Z7901 Long term (current) use of anticoagulants: Secondary | ICD-10-CM | POA: Diagnosis not present

## 2022-12-26 DIAGNOSIS — Z87891 Personal history of nicotine dependence: Secondary | ICD-10-CM | POA: Diagnosis not present

## 2022-12-26 LAB — CMP (CANCER CENTER ONLY)
ALT: 13 U/L (ref 0–44)
AST: 13 U/L — ABNORMAL LOW (ref 15–41)
Albumin: 4 g/dL (ref 3.5–5.0)
Alkaline Phosphatase: 69 U/L (ref 38–126)
Anion gap: 5 (ref 5–15)
BUN: 21 mg/dL (ref 8–23)
CO2: 25 mmol/L (ref 22–32)
Calcium: 8.9 mg/dL (ref 8.9–10.3)
Chloride: 109 mmol/L (ref 98–111)
Creatinine: 0.95 mg/dL (ref 0.61–1.24)
GFR, Estimated: >60 mL/min (ref >60)
Glucose, Bld: 115 mg/dL — ABNORMAL HIGH (ref 70–99)
Potassium: 4 mmol/L (ref 3.5–5.1)
Sodium: 139 mmol/L (ref 135–145)
Total Bilirubin: 0.8 mg/dL (ref 0.3–1.2)
Total Protein: 7 g/dL (ref 6.5–8.1)

## 2022-12-26 LAB — CBC WITH DIFFERENTIAL (CANCER CENTER ONLY)
Abs Immature Granulocytes: 0.03 10*3/uL (ref 0.00–0.07)
Basophils Absolute: 0.1 10*3/uL (ref 0.0–0.1)
Basophils Relative: 1 %
Eosinophils Absolute: 0.2 10*3/uL (ref 0.0–0.5)
Eosinophils Relative: 3 %
HCT: 33.3 % — ABNORMAL LOW (ref 39.0–52.0)
Hemoglobin: 10.9 g/dL — ABNORMAL LOW (ref 13.0–17.0)
Immature Granulocytes: 0 %
Lymphocytes Relative: 48 %
Lymphs Abs: 4 10*3/uL (ref 0.7–4.0)
MCH: 34.1 pg — ABNORMAL HIGH (ref 26.0–34.0)
MCHC: 32.7 g/dL (ref 30.0–36.0)
MCV: 104.1 fL — ABNORMAL HIGH (ref 80.0–100.0)
Monocytes Absolute: 0.5 10*3/uL (ref 0.1–1.0)
Monocytes Relative: 6 %
Neutro Abs: 3.5 10*3/uL (ref 1.7–7.7)
Neutrophils Relative %: 42 %
Platelet Count: 145 10*3/uL — ABNORMAL LOW (ref 150–400)
RBC: 3.2 MIL/uL — ABNORMAL LOW (ref 4.22–5.81)
RDW: 14.2 % (ref 11.5–15.5)
WBC Count: 8.3 10*3/uL (ref 4.0–10.5)
nRBC: 0 % (ref 0.0–0.2)

## 2022-12-26 LAB — LACTATE DEHYDROGENASE: LDH: 116 U/L (ref 98–192)

## 2022-12-26 NOTE — Progress Notes (Signed)
[facility] OFFICE PROGRESS NOTE  Patient Care Team: Glori Luis, MD as PCP - General (Family Medicine) Mariah Milling Tollie Pizza, MD as PCP - Cardiology (Cardiology) Antonieta Iba, MD as Consulting Physician (Cardiology) Earna Coder, MD as Medical Oncologist (Hematology and Oncology) Keitha Butte, RN as Registered Nurse (Oncology)   Cancer Staging  No matching staging information was found for the patient.   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  MARCH 2017- CT N/C/A/P- NED. STOP Ritux; surveillance   # AUG 2019- CT/PET- progression/NO transformation;  # NOV 2019- Progression; started Ibrutinib 420 mg/d. STOPPED in end of feb sec to extreme fatigue/joint pains/cramps  #November 01, 2018-start ibrutinib 280 mg a day; December 20, 2018-discontinue ibrutinib secondary multiple side effects.   #January 03, 2019 start Gazyva + Ven [July]; finished Dec 2020-; started Peter Minium maintenance [200 mg a day; DI-amiodarone]; SEP 2022- congestive heart failure; s/p cardiac cath-noted to have CAD however no stents placed.  2D echo- EF-55%  #Mid March 2021-venetoclax dose reduced to 100 mg [second diarrhea]. HELD in DEC 2022/JAN 2023 x3 months-multiple admissions to the hospital/pneumonia infection stroke x 3 m.   # MAY 2023-progression of disease in the neck ches; Nov 18, 2021-RE-started venetoclax;   # JUNE 29th, 2023-progression of disease in the neck- STOP VENAOCLAX;   # AUG 16th, 2023 START ACALABRUTINIB 100 mg BID [previous intolerance to ibrutinib; delayed sec to C.diff]    #? 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 Ambulating with a cane.   [name] 77 y.o.  male CLL-high risk of recurrent/relapsed currently on  alcabrutinib is here for a follow up/ and review the results of CT scan.   [facility] visit for syncope, dizziness and weakness.  Patient had extensive workup including MRI of the brain negative for any stroke.  Thought to be from vertigo.  Currently improved.   Patient had a interruption of alcabrutinib while in hospital.  Since starting back on acalabrutinib notes to have slight improvement of right and left side of his neck.  No night sweats.  Appetite is normal. Energy is up and down. Fair to Low.   Denies any further episodes of C. difficile. Complains of chronic joint pain.  Otherwise no fever no chills.  No nausea no vomiting.  No headache.  Review of Systems  Constitutional:  Positive for malaise/fatigue. Negative for chills, diaphoresis and fever.  HENT:  Negative for nosebleeds and sore throat.   Eyes:  Negative for double vision.  Respiratory:  Negative for hemoptysis, shortness of breath and wheezing.   Cardiovascular:  Negative for chest pain, palpitations, orthopnea and leg swelling.  Gastrointestinal:  Negative for abdominal pain, blood in stool, constipation,  heartburn, melena, nausea and vomiting.  Genitourinary:  Negative for dysuria, frequency and urgency.  Musculoskeletal:  Positive for back pain, joint pain and myalgias.  Skin: Negative.  Negative for itching and rash.  Neurological:  Negative for dizziness, tingling,  focal weakness and headaches.  Psychiatric/Behavioral:  Negative for depression. The patient is not nervous/anxious and does not have insomnia.    PAST MEDICAL HISTORY :  Past Medical History:  Diagnosis Date   Anxiety    Arthritis    Atrial fibrillation (HCC)    a. Dx 2013, recurred 02/2014, CHA2DS2VASc = 3 -->placed on Eliquis;  b. 02/2014 Echo: EF 50-55%, mid and apical anterior septum and mid and apical inf septum are abnl, mild to mod Ao sclerosis w/o AS.   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 01/2021 LV Gram: EF 35-45%; b. 02/2021 Echo: EF 55-60%, no rwma, GrI DD, mildly reduced RV fxn, triv MR, mild-mod AoV sclerosis w/o stenosis.   Chronic lymphocytic leukemia (HCC)    a. Dx 02/2014.   Complication of anesthesia    History of  PTSD--do not touch patient when waking up from surgery.   COPD (chronic obstructive pulmonary disease) (HCC)    Coronary artery disease    a. 04/2009 CABG x 3 (LIMA->LAD, VG->OM1, VG->PDA);  b. 09/2009 Cath: occluded VG x 2 w/ patent LIMA and L->R collats. EF 55%, mild antlat HK;  c. 01/2021 Cath: LM mild diff dzs, LAD 100p, LCX nl, RCA 50p, 95d, 99 side branch, RPAV fills via LCX. VG->RPDA 100, VG->OM1 100, LIMA->LAD nl. EF 35-45%.   GERD (gastroesophageal reflux disease)    occasional   History of chemotherapy 2015-2016   HOH (hard of hearing)    Bilateral Hearing Aids   Hyperlipidemia LDL goal <70    Hypertension    Ischemic cardiomyopathy    a. 01/2021 LV Gram: EF 35-45%; b. 02/2021 Echo: EF 55-60%.   Myocardial infarction (HCC) 2010   OSA on CPAP    USE C-PAP   PTSD (post-traumatic stress disorder)    Rheumatic fever 1959   SSS (sick sinus syndrome) (HCC)    a. 02/2016 s/p MDT MRI compatible DC PPM (ser# [id number]).   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   [facility]    TIA (transient ischemic attack) 11/02/2015    PAST SURGICAL HISTORY :   Past  Surgical History:  Procedure Laterality Date   ABDOMINAL HERNIA REPAIR     APPENDECTOMY  06/21/1985   CARDIAC CATHETERIZATION  2010; 2011   ; Dr Kirke Corin   CORONARY ARTERY BYPASS GRAFT  04/2009   "CABG X3"   EP IMPLANTABLE DEVICE N/A 03/03/2016   Procedure: Pacemaker Implant;  Surgeon: Duke Salvia, MD;  Location: [facility] INVASIVE CV LAB;  Service: Cardiovascular;  Laterality: N/A;   FOREIGN BODY REMOVAL  1968   "shrapnel in my tailbone"   INGUINAL HERNIA REPAIR Right    INSERT / REPLACE / REMOVE PACEMAKER     JOINT REPLACEMENT Right 2018   LAPAROSCOPIC CHOLECYSTECTOMY     RIGHT/LEFT HEART CATH AND CORONARY/GRAFT ANGIOGRAPHY N/A 02/04/2021   Procedure: RIGHT/LEFT HEART CATH AND CORONARY/GRAFT ANGIOGRAPHY;  Surgeon: Yvonne Kendall, MD;  Location: [facility] INVASIVE CV LAB;  Service: Cardiovascular;  Laterality: N/A;   TONSILLECTOMY AND ADENOIDECTOMY  1956   TOTAL HIP ARTHROPLASTY Right 10/22/2016   Procedure: TOTAL HIP ARTHROPLASTY;  Surgeon: Donato Heinz, MD;  Location: [facility] ORS;  Service: Orthopedics;  Laterality: Right;   TOTAL HIP ARTHROPLASTY Left 11/04/2017   Procedure: TOTAL HIP ARTHROPLASTY;  Surgeon: Donato Heinz, MD;  Location: [facility] ORS;  Service: Orthopedics;  Laterality: Left;    FAMILY HISTORY :   Family History  Problem Relation Age of Onset   Heart disease Mother    Heart attack Mother    Coronary artery disease Other        family history    SOCIAL HISTORY:   Social History   Tobacco Use   Smoking status: Former    Packs/day: 1.00    Years: 40.00    Additional pack years: 0.00    Total pack years: 40.00    Types: Cigarettes    Quit date: 07/21/2006    Years since quitting: 16.4   Smokeless tobacco: Never  Vaping Use   Vaping Use: Former  Substance Use Topics   Alcohol use: Not Currently   Drug use: No    ALLERGIES:  is allergic to augmentin [amoxicillin-pot clavulanate].  MEDICATIONS:  Current Outpatient Medications  Medication Sig Dispense Refill    acalabrutinib maleate (CALQUENCE) 100 MG tablet Take 1 tablet (100 mg total) by mouth 2 (two) times daily. 60 tablet 2   acetaminophen (TYLENOL) 325 MG tablet Take 1-2 tablets (325-650 mg total) by mouth every 4 (four) hours as needed for mild pain.     albuterol (VENTOLIN HFA) 108 (90 Base) MCG/ACT inhaler Inhale 2 puffs into the lungs every 6 (six) hours as needed for wheezing or shortness of breath. 1 each 0   amiodarone (PACERONE) 200 MG tablet Take 1/2 tablet (100 mg) by mouth twice daily 5 days a week 60 tablet 6   apixaban (ELIQUIS) 5 MG TABS tablet Take 5 mg by mouth 2 (two) times daily.     azelastine (ASTELIN) 0.1 % nasal spray Place 2 sprays into both nostrils 2 (two) times daily. Use in each nostril as directed 30 mL 1   benzonatate (TESSALON) 100 MG capsule Take 1 capsule (100 mg total) by mouth 3 (three) times daily as needed for cough. 60 capsule 0   budesonide-formoterol (SYMBICORT) 160-4.5 MCG/ACT inhaler Inhale 2 puffs into the lungs 2 (two) times daily. 1 Inhaler 11   cephALEXin (KEFLEX) 500 MG capsule Take 1 capsule (500 mg total) by mouth 3 (three) times daily. 15 capsule 0   cetirizine (ZYRTEC) 10 MG tablet Take 10 mg by mouth daily as needed for allergies.      Coenzyme Q10 (COQ10) 200 MG CAPS Take 200 mg by mouth daily.     diphenoxylate-atropine (LOMOTIL) 2.5-0.025 MG tablet Take by mouth 4 (four) times daily as needed for diarrhea or loose stools.     Evolocumab (REPATHA SURECLICK) 140 MG/ML SOAJ Inject 140 mg into the skin every 14 (fourteen) days. 2 mL 11   ezetimibe (ZETIA) 10 MG tablet Take 1 tablet (10 mg total) by mouth daily. 90 tablet 3   ferrous sulfate 325 (65 FE) MG EC tablet Take 325 mg by mouth 3 (three) times daily with meals.     gabapentin (NEURONTIN) 300 MG capsule Take by mouth.     Krill Oil 350 MG CAPS Take 350 mg by mouth daily as needed (Heart).     mirtazapine (REMERON) 15 MG tablet Take 15 mg by mouth at bedtime as needed (for panic associated with  PTSD).      Multiple Vitamin (MULTIVITAMIN WITH MINERALS) TABS tablet Take 1 tablet by mouth  daily.     nitroGLYCERIN (NITROSTAT) 0.4 MG SL tablet Place 1 tablet (0.4 mg total) under the tongue every 5 (five) minutes as needed for chest pain. 25 tablet 6   ondansetron (ZOFRAN-ODT) 8 MG disintegrating tablet Take 1 tablet (8 mg total) by mouth every 8 (eight) hours as needed for nausea or vomiting. 20 tablet 0   pantoprazole (PROTONIX) 40 MG tablet Take 40 mg by mouth daily.     tamsulosin (FLOMAX) 0.4 MG CAPS capsule Take 0.4 mg by mouth daily.     Tiotropium Bromide Monohydrate 2.5 MCG/ACT AERS Inhale 2 puffs into the lungs at bedtime. Spiriva     traMADol (ULTRAM) 50 MG tablet Take 1 tablet (50 mg total) by mouth every 12 (twelve) hours as needed. 60 tablet 0   acyclovir (ZOVIRAX) 400 MG tablet TAKE 1 TABLET(400 MG) BY MOUTH TWICE DAILY (Patient not taking: Reported on 10/07/2022) 60 tablet 6   furosemide (LASIX) 20 MG tablet Take 10 mg by mouth daily. (Patient not taking: Reported on 12/26/2022)     No current facility-administered medications for this visit.    PHYSICAL EXAMINATION: ECOG PERFORMANCE STATUS: 1 - Symptomatic but completely ambulatory  BP (!) 140/64 (BP Location: Right Arm, Patient Position: Sitting, Cuff Size: Large)   Pulse 65   Ht 5' 8.5" (1.74 m)   Wt 198 lb 9.6 oz (90.1 kg)   SpO2 100%   BMI 29.76 kg/m   Filed Weights   12/26/22 0957  Weight: 198 lb 9.6 oz (90.1 kg)    Physical Exam HENT:     Head: Normocephalic and atraumatic.     Mouth/Throat:     Pharynx: No oropharyngeal exudate.  Eyes:     Pupils: Pupils are equal, round, and reactive to light.  Cardiovascular:     Rate and Rhythm: Normal rate and regular rhythm.  Pulmonary:     Effort: No respiratory distress.     Breath sounds: No wheezing.  Abdominal:     General: Bowel sounds are normal. There is no distension.     Palpations: Abdomen is soft. There is no mass.     Tenderness: There is no  abdominal tenderness. There is no guarding or rebound.  Musculoskeletal:        General: No tenderness. Normal range of motion.     Cervical back: Normal range of motion and neck supple.  Skin:    General: Skin is warm.  Neurological:     Mental Status: He is alert and oriented to person, place, and time.  Psychiatric:        Mood and Affect: Affect normal.     LABORATORY DATA:  I have reviewed the data as listed    Component Value Date/Time   NA 139 12/26/2022 0959   NA 143 03/11/2021 1136   NA 139 10/11/2014 1800   K 4.0 12/26/2022 0959   K 3.3 (L) 10/11/2014 1800   CL 109 12/26/2022 0959   CL 106 10/11/2014 1800   CO2 25 12/26/2022 0959   CO2 27 10/11/2014 1800   GLUCOSE 115 (H) 12/26/2022 0959   GLUCOSE 107 (H) 10/11/2014 1800   BUN 21 12/26/2022 0959   BUN 13 03/11/2021 1136   BUN 15 10/11/2014 1800   CREATININE 0.95 12/26/2022 0959   CREATININE 0.89 10/11/2014 1800   CALCIUM PENDING 12/26/2022 0959   CALCIUM 8.8 (L) 10/11/2014 1800   PROT 7.0 12/26/2022 0959   PROT 6.7 05/18/2017 1048   PROT 6.4 (L) 10/11/2014  1800   ALBUMIN 4.0 12/26/2022 0959   ALBUMIN 4.3 05/18/2017 1048   ALBUMIN 4.1 10/11/2014 1800   AST 13 (L) 12/26/2022 0959   ALT 13 12/26/2022 0959   ALT 22 10/11/2014 1800   ALKPHOS 69 12/26/2022 0959   ALKPHOS 61 10/11/2014 1800   BILITOT 0.8 12/26/2022 0959   GFRNONAA >60 12/26/2022 0959   GFRNONAA >60 10/11/2014 1800   GFRAA >60 04/12/2020 0959   GFRAA >60 10/11/2014 1800    No results found for: "SPEP", "UPEP"  Lab Results  Component Value Date   WBC 8.3 12/26/2022   NEUTROABS 3.5 12/26/2022   HGB 10.9 (L) 12/26/2022   HCT 33.3 (L) 12/26/2022   MCV 104.1 (H) 12/26/2022   PLT 145 (L) 12/26/2022      Chemistry      Component Value Date/Time   NA 139 12/26/2022 0959   NA 143 03/11/2021 1136   NA 139 10/11/2014 1800   K 4.0 12/26/2022 0959   K 3.3 (L) 10/11/2014 1800   CL 109 12/26/2022 0959   CL 106 10/11/2014 1800   CO2 25  12/26/2022 0959   CO2 27 10/11/2014 1800   BUN 21 12/26/2022 0959   BUN 13 03/11/2021 1136   BUN 15 10/11/2014 1800   CREATININE 0.95 12/26/2022 0959   CREATININE 0.89 10/11/2014 1800      Component Value Date/Time   CALCIUM PENDING 12/26/2022 0959   CALCIUM 8.8 (L) 10/11/2014 1800   ALKPHOS 69 12/26/2022 0959   ALKPHOS 61 10/11/2014 1800   AST 13 (L) 12/26/2022 0959   ALT 13 12/26/2022 0959   ALT 22 10/11/2014 1800   BILITOT 0.8 12/26/2022 0959       RADIOGRAPHIC STUDIES: I have personally reviewed the radiological images as listed and agreed with the findings in the report. No results found.   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  Patient currently on Acalabrutnib [AUG 16th, 2023]-  Patient admits to compliance; JUNE 13th, 2024- [compared to CT JAN 2024] -Increased adenopathy above and below the diaphragm compatible with progressive lymphomatous disease.  No splenomegaly.  # Above radiographic progression discussed with the patient.  However patient had interruption of his  acal just prior to his CT scan.  Clinically noted to have improvement of his neck lymph nodes.  Continue Acalbrutinib 100 mg BID. For now- will repeat in 3-4 months.   # Mild Anemia Hb ~10-11- NOV 2023- iron sat- 17%; ferritin-71 continue gentle iron/slow iron. Monitor for now. If  get worse- recommend bone marrow.   # Low immunoglobulins IgG 330/recent severe respiratory infection/CLL-s/pI IVIG #4/4 [last April 2022].MARCH 2024-- IgG > 1500- stable.    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   DISPOSITION: # follow up in 2 month-  MD; labs- cbc/cmp; LDH; B12; folia cid; retic count; haptoglobin- Dr.B    Orders Placed This Encounter  Procedures   CBC with Differential (Cancer Center Only)    Standing Status:   Future    Standing Expiration Date:   12/26/2023   CMP (  Cancer Center only)    Standing Status:   Future    Standing Expiration Date:   12/26/2023   Lactate dehydrogenase    Standing Status:   Future    Standing Expiration Date:   12/26/2023   Vitamin B12    Standing Status:   Future    Standing Expiration Date:   12/26/2023   Folate    Standing Status:   Future    Standing Expiration Date:   12/26/2023   Reticulocytes    Standing Status:   Future    Standing Expiration Date:   12/26/2023   Haptoglobin    Standing Status:   Future    Standing Expiration Date:   12/26/2023   All questions were answered. The patient knows to call the clinic with any problems, questions or concerns.     Earna Coder, MD 12/26/2022 12:16 PM

## 2022-12-26 NOTE — Progress Notes (Signed)
Pt was seen at [facility] since last visit for syncope, dizziness and weakness.

## 2022-12-26 NOTE — Assessment & Plan Note (Addendum)
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  Patient currently on Acalabrutnib [AUG 16th, 2023]-  Patient admits to compliance; JUNE 13th, 2024- [compared to CT JAN 2024] -Increased adenopathy above and below the diaphragm compatible with progressive lymphomatous disease.  No splenomegaly.  # Above radiographic progression discussed with the patient.  However patient had interruption of his  acal just prior to his CT scan.  Clinically noted to have improvement of his neck lymph nodes.  Continue Acalbrutinib 100 mg BID. For now- will repeat in 3-4 months.   # Mild Anemia Hb ~10-11- NOV 2023- iron sat- 17%; ferritin-71 continue gentle iron/slow iron. Monitor for now. If  get worse- recommend bone marrow.   # Low immunoglobulins IgG 330/recent severe respiratory infection/CLL-s/pI IVIG #4/4 [last April 2022].MARCH 2024-- IgG > 1500- stable.    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   DISPOSITION: # follow up in 2 month-  MD; labs- cbc/cmp; LDH; B12; folia cid; retic count; haptoglobin- Dr.B

## 2023-01-07 ENCOUNTER — Ambulatory Visit: Payer: No Typology Code available for payment source | Admitting: Family Medicine

## 2023-01-09 ENCOUNTER — Encounter: Payer: Self-pay | Admitting: Family Medicine

## 2023-01-09 ENCOUNTER — Ambulatory Visit (INDEPENDENT_AMBULATORY_CARE_PROVIDER_SITE_OTHER): Payer: Medicare HMO | Admitting: Family Medicine

## 2023-01-09 VITALS — BP 126/82 | HR 68 | Temp 98.3°F | Ht 68.5 in | Wt 196.0 lb

## 2023-01-09 DIAGNOSIS — N401 Enlarged prostate with lower urinary tract symptoms: Secondary | ICD-10-CM

## 2023-01-09 DIAGNOSIS — J432 Centrilobular emphysema: Secondary | ICD-10-CM

## 2023-01-09 DIAGNOSIS — M4800 Spinal stenosis, site unspecified: Secondary | ICD-10-CM | POA: Insufficient documentation

## 2023-01-09 DIAGNOSIS — I10 Essential (primary) hypertension: Secondary | ICD-10-CM | POA: Diagnosis not present

## 2023-01-09 DIAGNOSIS — M48062 Spinal stenosis, lumbar region with neurogenic claudication: Secondary | ICD-10-CM

## 2023-01-09 DIAGNOSIS — K219 Gastro-esophageal reflux disease without esophagitis: Secondary | ICD-10-CM

## 2023-01-09 DIAGNOSIS — M791 Myalgia, unspecified site: Secondary | ICD-10-CM | POA: Diagnosis not present

## 2023-01-09 DIAGNOSIS — R350 Frequency of micturition: Secondary | ICD-10-CM

## 2023-01-09 DIAGNOSIS — R42 Dizziness and giddiness: Secondary | ICD-10-CM | POA: Diagnosis not present

## 2023-01-09 DIAGNOSIS — N400 Enlarged prostate without lower urinary tract symptoms: Secondary | ICD-10-CM | POA: Insufficient documentation

## 2023-01-09 DIAGNOSIS — E785 Hyperlipidemia, unspecified: Secondary | ICD-10-CM

## 2023-01-09 LAB — BASIC METABOLIC PANEL
BUN: 20 mg/dL (ref 6–23)
CO2: 26 mEq/L (ref 19–32)
Calcium: 9 mg/dL (ref 8.4–10.5)
Chloride: 108 mEq/L (ref 96–112)
Creatinine, Ser: 1.02 mg/dL (ref 0.40–1.50)
GFR: 70.87 mL/min (ref >60.00)
Glucose, Bld: 104 mg/dL — ABNORMAL HIGH (ref 70–99)
Potassium: 4.1 mEq/L (ref 3.5–5.1)
Sodium: 141 mEq/L (ref 135–145)

## 2023-01-09 LAB — CK: Total CK: 80 U/L (ref 7–232)

## 2023-01-09 MED ORDER — PANTOPRAZOLE SODIUM 40 MG PO TBEC: 40.0000 mg | DELAYED_RELEASE_TABLET | Freq: Every day | ORAL | 2 refills | Status: DC

## 2023-01-09 MED ORDER — TAMSULOSIN HCL 0.4 MG PO CAPS: 0.4000 mg | ORAL_CAPSULE | Freq: Every day | ORAL | 3 refills | Status: DC

## 2023-01-09 NOTE — Assessment & Plan Note (Signed)
Patient with possible BPH leading to urinary retention while in the hospital.  He notes improved urination on Flomax.  He will continue Flomax 0.4 mg daily.

## 2023-01-09 NOTE — Assessment & Plan Note (Signed)
Possibly related to BPPV.  He was given modified Epley maneuvers to do at home.  I demonstrated these for him in the office.

## 2023-01-09 NOTE — Assessment & Plan Note (Signed)
Chronic issue.  Patient with some increased cough recently.  Discussed the potential for altering his inhaler regimen versus treating with a short course of prednisone.  Patient opted against this at this time and wants to monitor while taking care of his other issues.  For now he will continue Spiriva 2 puffs daily and Symbicort 2 puffs twice daily.  If he has worsening symptoms he will let us know.

## 2023-01-09 NOTE — Progress Notes (Signed)
Michael Alar, MD Phone: [phone]  [name] is a 77 y.o. male who presents today for follow-up.  Dizziness: Patient was hospitalized after having dizziness for several days with associated nausea and vomiting.  He had an extensive workup that did not reveal a specific cause through the workup.  It was felt he may have had orthostasis or BPPV.  He had some weakness and had negative workup for that as well.  He was referred to home health for physical therapy and he notes that has been helpful with his weakness and building his strength back up.  His dizziness did improve with IV fluids and p.o. intake though he does report still having some wobbly sensation.  He notes he was given exercises to do where he turns his head to the right and falls to the bed.  Hyperlipidemia: He is on Repatha and Zetia.  No chest pain or right upper quadrant pain.  He does have lots of pain in his legs.  Leg pain/back and neck pain: Patient had MRI cervical, thoracic, and lumbar spine that revealed spinal stenosis in his cervical spine and lumbar spine.  He notes lots of leg pain particularly when he is standing up or walking.  He notes when he gets down to do something on the ground he has a hard time getting up.  He reports muscle and joint pain with this.  He takes tramadol and Tylenol for pain with some benefit.  COPD: Patient is on Spiriva and Symbicort.  He does cough at times and notes it is a little worse over the last several weeks.  He notes no wheezing.  He notes his shortness of breath is stable.  Patient was started on Flomax for possible BPH given urinary retention while in the hospital.  He notes he is urinating better since going on Flomax.  He was also started on Protonix and thinks this may have helped some with his nausea issues.  Social History   Tobacco Use  Smoking Status Former   Packs/day: 1.00   Years: 40.00   Additional pack years: 0.00   Total pack years: 40.00   Types:  Cigarettes   Quit date: 07/21/2006   Years since quitting: 16.4  Smokeless Tobacco Never    Current Outpatient Medications on File Prior to Visit  Medication Sig Dispense Refill   acalabrutinib maleate (CALQUENCE) 100 MG tablet Take 1 tablet (100 mg total) by mouth 2 (two) times daily. 60 tablet 2   acetaminophen (TYLENOL) 325 MG tablet Take 1-2 tablets (325-650 mg total) by mouth every 4 (four) hours as needed for mild pain.     acyclovir (ZOVIRAX) 400 MG tablet TAKE 1 TABLET(400 MG) BY MOUTH TWICE DAILY 60 tablet 6   albuterol (VENTOLIN HFA) 108 (90 Base) MCG/ACT inhaler Inhale 2 puffs into the lungs every 6 (six) hours as needed for wheezing or shortness of breath. 1 each 0   amiodarone (PACERONE) 200 MG tablet Take 1/2 tablet (100 mg) by mouth twice daily 5 days a week 60 tablet 6   apixaban (ELIQUIS) 5 MG TABS tablet Take 5 mg by mouth 2 (two) times daily.     azelastine (ASTELIN) 0.1 % nasal spray Place 2 sprays into both nostrils 2 (two) times daily. Use in each nostril as directed 30 mL 1   benzonatate (TESSALON) 100 MG capsule Take 1 capsule (100 mg total) by mouth 3 (three) times daily as needed for cough. 60 capsule 0   budesonide-formoterol (SYMBICORT) 160-4.5  MCG/ACT inhaler Inhale 2 puffs into the lungs 2 (two) times daily. 1 Inhaler 11   cetirizine (ZYRTEC) 10 MG tablet Take 10 mg by mouth daily as needed for allergies.      Coenzyme Q10 (COQ10) 200 MG CAPS Take 200 mg by mouth daily.     diphenoxylate-atropine (LOMOTIL) 2.5-0.025 MG tablet Take by mouth 4 (four) times daily as needed for diarrhea or loose stools.     Evolocumab (REPATHA SURECLICK) 140 MG/ML SOAJ Inject 140 mg into the skin every 14 (fourteen) days. 2 mL 11   ezetimibe (ZETIA) 10 MG tablet Take 1 tablet (10 mg total) by mouth daily. 90 tablet 3   ferrous sulfate 325 (65 FE) MG EC tablet Take 325 mg by mouth 3 (three) times daily with meals.     furosemide (LASIX) 20 MG tablet Take 10 mg by mouth daily.      gabapentin (NEURONTIN) 300 MG capsule Take by mouth.     Krill Oil 350 MG CAPS Take 350 mg by mouth daily as needed (Heart).     mirtazapine (REMERON) 15 MG tablet Take 15 mg by mouth at bedtime as needed (for panic associated with PTSD).      Multiple Vitamin (MULTIVITAMIN WITH MINERALS) TABS tablet Take 1 tablet by mouth daily.     nitroGLYCERIN (NITROSTAT) 0.4 MG SL tablet Place 1 tablet (0.4 mg total) under the tongue every 5 (five) minutes as needed for chest pain. 25 tablet 6   ondansetron (ZOFRAN-ODT) 8 MG disintegrating tablet Take 1 tablet (8 mg total) by mouth every 8 (eight) hours as needed for nausea or vomiting. 20 tablet 0   Tiotropium Bromide Monohydrate 2.5 MCG/ACT AERS Inhale 2 puffs into the lungs at bedtime. Spiriva     traMADol (ULTRAM) 50 MG tablet Take 1 tablet (50 mg total) by mouth every 12 (twelve) hours as needed. 60 tablet 0   No current facility-administered medications on file prior to visit.     ROS see history of present illness  Objective  Physical Exam Vitals:   01/09/23 0908 01/09/23 0954  BP: (!) 140/86 126/82  Pulse: 68   Temp: 98.3 F (36.8 C)   SpO2: 98%     BP Readings from Last 3 Encounters:  01/09/23 126/82  12/26/22 (!) 140/64  12/06/22 116/70   Wt Readings from Last 3 Encounters:  01/09/23 196 lb (88.9 kg)  12/26/22 198 lb 9.6 oz (90.1 kg)  12/06/22 192 lb (87.1 kg)    Physical Exam Constitutional:      General: He is not in acute distress.    Appearance: He is not diaphoretic.  Cardiovascular:     Rate and Rhythm: Normal rate and regular rhythm.     Heart sounds: Normal heart sounds.  Pulmonary:     Effort: Pulmonary effort is normal.     Breath sounds: Normal breath sounds.  Skin:    General: Skin is warm and dry.  Neurological:     Mental Status: He is alert.      Assessment/Plan: Please see individual problem list.  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  He was given modified Epley maneuvers to  do at home.  I demonstrated these for him in the office.   Essential hypertension -     Basic metabolic panel  Myalgia -     CK  Centrilobular emphysema (HCC) Assessment & Plan: Chronic issue.  Patient with some increased cough recently.  Discussed the potential for altering his  inhaler regimen versus treating with a short course of prednisone.  Patient opted against this at this time and wants to monitor while taking care of his other issues.  For now he will continue Spiriva 2 puffs daily and Symbicort 2 puffs twice daily.  If he has worsening symptoms he will let us know.   Hyperlipidemia, unspecified hyperlipidemia type Assessment & Plan: Chronic issue.  Patient will continue Repatha 140 mg every 14 days and Zetia 10 mg daily.   Spinal stenosis of lumbar region with neurogenic claudication Assessment & Plan: Patient with spinal stenosis on MRI lumbar spine and cervical spine.  He is having likely neurogenic claudication symptoms in his legs.  We are getting a CK to rule out underlying muscle issues.  Patient also likely has arthritis in the joints in his legs as well.  Discussed referral to neurosurgery to get their input on possible treatment options for this.  Patient can continue tramadol 50 mg twice daily as needed for pain and Tylenol over-the-counter for pain.  Orders: -     Ambulatory referral to Neurosurgery  Gastroesophageal reflux disease, unspecified whether esophagitis present Assessment & Plan: Continue with Protonix 40 mg once daily.   Benign prostatic hyperplasia with urinary frequency Assessment & Plan: Patient with possible BPH leading to urinary retention while in the hospital.  He notes improved urination on Flomax.  He will continue Flomax 0.4 mg daily.   Other orders -     Tamsulosin HCl; Take 1 capsule (0.4 mg total) by mouth daily.  Dispense: 30 capsule; Refill: 3 -     Pantoprazole Sodium; Take 1 tablet (40 mg total) by mouth daily.  Dispense: 30  tablet; Refill: 2    Return in about 3 months (around 04/11/2023).  I have spent 42 minutes in the care of this patient regarding history taking, documentation, completion of exam, review of hospital discharge summary, discussion of plan, placing orders.   Michael Alar, MD [facility]

## 2023-01-09 NOTE — Assessment & Plan Note (Signed)
Chronic issue.  Patient will continue Repatha 140 mg every 14 days and Zetia 10 mg daily.

## 2023-01-09 NOTE — Assessment & Plan Note (Signed)
Patient with spinal stenosis on MRI lumbar spine and cervical spine.  He is having likely neurogenic claudication symptoms in his legs.  We are getting a CK to rule out underlying muscle issues.  Patient also likely has arthritis in the joints in his legs as well.  Discussed referral to neurosurgery to get their input on possible treatment options for this.  Patient can continue tramadol 50 mg twice daily as needed for pain and Tylenol over-the-counter for pain.

## 2023-01-09 NOTE — Patient Instructions (Signed)
Nice to see you. We will check labs today.  I refilled the flomax and protonix for you. Please try the following exercises to help with your wobbly sensation.   How to Perform the Epley Maneuver The Epley maneuver is an exercise that relieves symptoms of vertigo. Vertigo is the feeling that you or your surroundings are moving when they are not. When you feel vertigo, you may feel like the room is spinning and may have trouble walking. The Epley maneuver is used for a type of vertigo caused by a calcium deposit in a part of the inner ear. The maneuver involves changing head positions to help the deposit move out of the area. You can do this maneuver at home whenever you have symptoms of vertigo. You can repeat it in 24 hours if your vertigo has not gone away. Even though the Epley maneuver may relieve your vertigo for a few weeks, it is possible that your symptoms will return. This maneuver relieves vertigo, but it does not relieve dizziness. What are the risks? If it is done correctly, the Epley maneuver is considered safe. Sometimes it can lead to dizziness or nausea that goes away after a short time. If you develop other symptoms--such as changes in vision, weakness, or numbness--stop doing the maneuver and call your health care provider. Supplies needed: A bed or table. A pillow. How to do the Epley maneuver     Sit on the edge of a bed or table with your back straight and your legs extended or hanging over the edge of the bed or table. Turn your head halfway toward the affected ear or side as told by your health care provider. Lie backward quickly with your head turned until you are lying flat on your back. Your head should dangle (head-hanging position). You may want to position a pillow under your shoulders. Hold this position for at least 30 seconds. If you feel dizzy or have symptoms of vertigo, continue to hold the position until the symptoms stop. Turn your head to the opposite  direction until your unaffected ear is facing down. Your head should continue to dangle. Hold this position for at least 30 seconds. If you feel dizzy or have symptoms of vertigo, continue to hold the position until the symptoms stop. Turn your whole body to the same side as your head so that you are positioned on your side. Your head will now be nearly facedown and no longer needs to dangle. Hold for at least 30 seconds. If you feel dizzy or have symptoms of vertigo, continue to hold the position until the symptoms stop. Sit back up. You can repeat the maneuver in 24 hours if your vertigo does not go away. Follow these instructions at home: For 24 hours after doing the Epley maneuver: Keep your head in an upright position. When lying down to sleep or rest, keep your head raised (elevated) with two or more pillows. Avoid excessive neck movements. Activity Do not drive or use machinery if you feel dizzy. After doing the Epley maneuver, return to your normal activities as told by your health care provider. Ask your health care provider what activities are safe for you. General instructions Drink enough fluid to keep your urine pale yellow. Do not drink alcohol. Take over-the-counter and prescription medicines only as told by your health care provider. Keep all follow-up visits. This is important. Preventing vertigo symptoms Ask your health care provider if there is anything you should do at home to prevent  vertigo. He or she may recommend that you: Keep your head elevated with two or more pillows while you sleep. Do not sleep on the side of your affected ear. Get up slowly from bed. Avoid sudden movements during the day. Avoid extreme head positions or movement, such as looking up or bending over. Contact a health care provider if: Your vertigo gets worse. You have other symptoms, including: Nausea. Vomiting. Headache. Get help right away if you: Have vision changes. Have a headache or  neck pain that is severe or getting worse. Cannot stop vomiting. Have new numbness or weakness in any part of your body. These symptoms may represent a serious problem that is an emergency. Do not wait to see if the symptoms will go away. Get medical help right away. Call your local emergency services (911 in the [place] Do not drive yourself to the hospital. Summary Vertigo is the feeling that you or your surroundings are moving when they are not. The Epley maneuver is an exercise that relieves symptoms of vertigo. If the Epley maneuver is done correctly, it is considered safe. This information is not intended to replace advice given to you by your health care provider. Make sure you discuss any questions you have with your health care provider. Document Revised: 06/06/2020 Document Reviewed: 06/06/2020 Elsevier Patient Education  2024 [employer].

## 2023-01-09 NOTE — Assessment & Plan Note (Signed)
Continue with Protonix 40 mg once daily.

## 2023-01-14 ENCOUNTER — Telehealth: Payer: Self-pay

## 2023-01-14 NOTE — Telephone Encounter (Signed)
Noted. Please follow-up with the patient to make sure he did not hit his head or have any other injury from this fall.

## 2023-01-14 NOTE — Telephone Encounter (Signed)
[name] called from [facility] to state she would like to let Dr. [name] know that when she went to visit patient today, patient stated that he fell on Monday (01/12/2023).  Irving Burton states patient was going to sit on a stool and he was on the phone, which distracted him, and he fell.  Irving Burton states patient has small space that has scabbed over on his elbow and no other injury.  Irving Burton states she will have her office fax a request for orders and she would like to have it back as soon as possible so they don't miss visits with patient.

## 2023-01-15 ENCOUNTER — Other Ambulatory Visit (HOSPITAL_COMMUNITY): Payer: Self-pay

## 2023-01-15 NOTE — Telephone Encounter (Signed)
Noted  

## 2023-01-15 NOTE — Telephone Encounter (Signed)
Patient states he did not hit his head or have any injuries with this fall. Patient states he got off balance and fell but he sees neurology 02/10/23.

## 2023-01-19 ENCOUNTER — Telehealth: Payer: Self-pay | Admitting: Family Medicine

## 2023-01-19 NOTE — Telephone Encounter (Signed)
Irving Burton from [facility] called wanting to know if we receive orders last week for pt. Irving Burton wanted to add a visit for this week and two for next week also once a week for two weeks.

## 2023-01-21 NOTE — Telephone Encounter (Signed)
I have signed them today. You can call and give verbal orders to extend PT.

## 2023-01-21 NOTE — Telephone Encounter (Signed)
Spoke with Irving Burton at [facility] and gave verbal orders to extend PT for the Patient the written orders will be faxed over per Dr. Birdie Sons.

## 2023-01-21 NOTE — Telephone Encounter (Signed)
Irving Burton called back today, [phone]. She really needs orders signed or called to her asap. She would like to see patient this Friday, 7/5/204. OK to call her and leave a message with verbal orders, visit for this week and once next week for two weeks.

## 2023-01-27 ENCOUNTER — Other Ambulatory Visit (HOSPITAL_COMMUNITY): Payer: Self-pay

## 2023-01-28 ENCOUNTER — Other Ambulatory Visit (HOSPITAL_COMMUNITY): Payer: Self-pay

## 2023-01-29 ENCOUNTER — Other Ambulatory Visit: Payer: Self-pay

## 2023-02-02 ENCOUNTER — Inpatient Hospital Stay
Admission: RE | Admit: 2023-02-02 | Discharge: 2023-02-02 | Disposition: A | Discharge status: Home / Self Care | Payer: Self-pay | Source: Ambulatory Visit | Attending: Neurosurgery | Admitting: Neurosurgery

## 2023-02-02 ENCOUNTER — Other Ambulatory Visit: Payer: Self-pay

## 2023-02-02 DIAGNOSIS — Z049 Encounter for examination and observation for unspecified reason: Secondary | ICD-10-CM

## 2023-02-03 ENCOUNTER — Ambulatory Visit (INDEPENDENT_AMBULATORY_CARE_PROVIDER_SITE_OTHER): Payer: Medicare HMO

## 2023-02-03 DIAGNOSIS — I495 Sick sinus syndrome: Secondary | ICD-10-CM | POA: Diagnosis not present

## 2023-02-05 LAB — CUP PACEART REMOTE DEVICE CHECK
Battery Remaining Longevity: 37 mo
Battery Voltage: 2.96 V
Brady Statistic AP VP Percent: 1.92 %
Brady Statistic AP VS Percent: 86.78 %
Brady Statistic AS VP Percent: 0.03 %
Brady Statistic AS VS Percent: 11.27 %
Brady Statistic RA Percent Paced: 88.71 %
Brady Statistic RV Percent Paced: 2.05 %
Date Time Interrogation Session: 20240716103043
Implantable Lead Connection Status: 753985
Implantable Lead Connection Status: 753985
Implantable Lead Implant Date: 20170814
Implantable Lead Implant Date: 20170814
Implantable Lead Location: 753859
Implantable Lead Location: 753860
Implantable Lead Model: 5076
Implantable Lead Model: 5076
Implantable Pulse Generator Implant Date: 20170814
Lead Channel Impedance Value: 323 Ohm
Lead Channel Impedance Value: 437 Ohm
Lead Channel Impedance Value: 475 Ohm
Lead Channel Impedance Value: 551 Ohm
Lead Channel Pacing Threshold Amplitude: 0.625 V
Lead Channel Pacing Threshold Amplitude: 0.75 V
Lead Channel Pacing Threshold Pulse Width: 0.4 ms
Lead Channel Pacing Threshold Pulse Width: 0.4 ms
Lead Channel Sensing Intrinsic Amplitude: 17.875 mV
Lead Channel Sensing Intrinsic Amplitude: 17.875 mV
Lead Channel Sensing Intrinsic Amplitude: 2.75 mV
Lead Channel Sensing Intrinsic Amplitude: 2.75 mV
Lead Channel Setting Pacing Amplitude: 2 V
Lead Channel Setting Pacing Amplitude: 2.5 V
Lead Channel Setting Pacing Pulse Width: 0.4 ms
Lead Channel Setting Sensing Sensitivity: 0.9 mV
Zone Setting Status: 755011
Zone Setting Status: 755011

## 2023-02-06 NOTE — Progress Notes (Unsigned)
Referring Physician:  Glori Luis, MD [address] [place],  [place] [address]  Primary Physician:  Glori Luis, MD  History of Present Illness: 02/10/2023 [name] is a 77 y.o with a history of HTN,  COPD, HLD, GERD, BPH, CLL, CVD on Eliquis who is here today for further evaluation of both his neck and low back. In regards to his cervical spine, he has had ongoing neck pain for many years which is worsened over the last several months.  He endorses popping and cracking and limited rotation.  He denies any pain that radiates down his arms, numbness or tingling.  Despite this his symptoms have impacted his quality of life and are bothersome to him.  He has not undergone any recent injections in his neck.   In regards to his lumbar spine, he reports chronic circumferential pain in his lower extremities is worse with walking but is largely constant.  It is worse with standing and walking and improved some with IcyHot.  He denies any specific back pain. Overall he reports some gait instability worse over the last 3 to 4 months.  He is an avid Estate manager/land agent and his pain does affect his ability to do this.  Conservative measures:  Physical therapy: currently participating in [facility] PT for his legs; has not participated for his neck Multimodal medical therapy including regular antiinflammatories:  tramadol, Tylenol Injections: has not received injections  Past Surgery: no previous spine surgery  [name] has symptoms concerning for cervical myelopathy.  The symptoms are causing a significant impact on the patient's life.   I have utilized the care everywhere function in epic to review the outside records available from external health systems.  Review of Systems:  A 10 point review of systems is negative, except for the pertinent positives and negatives detailed in the HPI.  Past Medical History: Past Medical History:  Diagnosis Date   Anxiety     Arthritis    Atrial fibrillation (HCC)    a. Dx 2013, recurred 02/2014, CHA2DS2VASc = 3 -->placed on Eliquis;  b. 02/2014 Echo: EF 50-55%, mid and apical anterior septum and mid and apical inf septum are abnl, mild to mod Ao sclerosis w/o AS.   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 01/2021 LV Gram: EF 35-45%; b. 02/2021 Echo: EF 55-60%, no rwma, GrI DD, mildly reduced RV fxn, triv MR, mild-mod AoV sclerosis w/o stenosis.   Chronic lymphocytic leukemia (HCC)    a. Dx 02/2014.   Complication of anesthesia    History of  PTSD--do not touch patient when waking up from surgery.   COPD (chronic obstructive pulmonary disease) (HCC)    Coronary artery disease    a. 04/2009 CABG x 3 (LIMA->LAD, VG->OM1, VG->PDA);  b. 09/2009 Cath: occluded VG x 2 w/ patent LIMA and L->R collats. EF 55%, mild antlat HK;  c. 01/2021 Cath: LM mild diff dzs, LAD 100p, LCX nl, RCA 50p, 95d, 99 side branch, RPAV fills via LCX. VG->RPDA 100, VG->OM1 100, LIMA->LAD nl. EF 35-45%.   GERD (gastroesophageal reflux disease)    occasional   History of chemotherapy 2015-2016   HOH (hard of hearing)    Bilateral Hearing Aids   Hyperlipidemia LDL goal <70    Hypertension    Ischemic cardiomyopathy    a. 01/2021 LV Gram: EF 35-45%; b. 02/2021 Echo: EF  55-60%.   Myocardial infarction (HCC) 2010   OSA on CPAP    USE C-PAP   PTSD (post-traumatic stress disorder)    Rheumatic fever 1959   SSS (sick sinus syndrome) (HCC)    a. 02/2016 s/p MDT MRI compatible DC PPM (ser# [id number]).   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   Stroke [facility])    TIA (transient ischemic attack) 11/02/2015    Past Surgical History: Past Surgical History:  Procedure Laterality Date   ABDOMINAL HERNIA REPAIR     APPENDECTOMY  06/21/1985   CARDIAC CATHETERIZATION  2010; 2011   ; Dr Kirke Corin   CORONARY ARTERY BYPASS GRAFT  04/2009   "CABG X3"   EP IMPLANTABLE DEVICE  N/A 03/03/2016   Procedure: Pacemaker Implant;  Surgeon: Duke Salvia, MD;  Location[facility] INVASIVE CV LAB;  Service: Cardiovascular;  Laterality: N/A;   FOREIGN BODY REMOVAL  1968   "shrapnel in my tailbone"   INGUINAL HERNIA REPAIR Right    INSERT / REPLACE / REMOVE PACEMAKER     JOINT REPLACEMENT Right 2018   LAPAROSCOPIC CHOLECYSTECTOMY     RIGHT/LEFT HEART CATH AND CORONARY/GRAFT ANGIOGRAPHY N/A 02/04/2021   Procedure: RIGHT/LEFT HEART CATH AND CORONARY/GRAFT ANGIOGRAPHY;  Surgeon: Yvonne Kendall, MD;  Location: [facility] INVASIVE CV LAB;  Service: Cardiovascular;  Laterality: N/A;   TONSILLECTOMY AND ADENOIDECTOMY  1956   TOTAL HIP ARTHROPLASTY Right 10/22/2016   Procedure: TOTAL HIP ARTHROPLASTY;  Surgeon: Donato Heinz, MD;  Location: [facility] ORS;  Service: Orthopedics;  Laterality: Right;   TOTAL HIP ARTHROPLASTY Left 11/04/2017   Procedure: TOTAL HIP ARTHROPLASTY;  Surgeon: Donato Heinz, MD;  Location: [facility] ORS;  Service: Orthopedics;  Laterality: Left;    Allergies: Allergies as of 02/10/2023 - Review Complete 02/10/2023  Allergen Reaction Noted   Augmentin [amoxicillin-pot clavulanate]  04/17/2022    Medications:  Current Outpatient Medications:    acalabrutinib maleate (CALQUENCE) 100 MG tablet, Take 1 tablet (100 mg total) by mouth 2 (two) times daily., Disp: 60 tablet, Rfl: 2   acetaminophen (TYLENOL) 325 MG tablet, Take 1-2 tablets (325-650 mg total) by mouth every 4 (four) hours as needed for mild pain., Disp: , Rfl:    acyclovir (ZOVIRAX) 400 MG tablet, TAKE 1 TABLET(400 MG) BY MOUTH TWICE DAILY, Disp: 60 tablet, Rfl: 6   albuterol (VENTOLIN HFA) 108 (90 Base) MCG/ACT inhaler, Inhale 2 puffs into the lungs every 6 (six) hours as needed for wheezing or shortness of breath., Disp: 1 each, Rfl: 0   amiodarone (PACERONE) 200 MG tablet, Take 1/2 tablet (100 mg) by mouth twice daily 5 days a week, Disp: 60 tablet, Rfl: 6   apixaban (ELIQUIS) 5 MG TABS tablet, Take 5 mg by mouth 2  (two) times daily., Disp: , Rfl:    azelastine (ASTELIN) 0.1 % nasal spray, Place 2 sprays into both nostrils 2 (two) times daily. Use in each nostril as directed, Disp: 30 mL, Rfl: 1   benzonatate (TESSALON) 100 MG capsule, Take 1 capsule (100 mg total) by mouth 3 (three) times daily as needed for cough., Disp: 60 capsule, Rfl: 0   budesonide-formoterol (SYMBICORT) 160-4.5 MCG/ACT inhaler, Inhale 2 puffs into the lungs 2 (two) times daily., Disp: 1 Inhaler, Rfl: 11   cetirizine (ZYRTEC) 10 MG tablet, Take 10 mg by mouth daily as needed for allergies. , Disp: , Rfl:    Coenzyme Q10 (COQ10) 200 MG CAPS, Take 200 mg by mouth daily., Disp: , Rfl:  diphenoxylate-atropine (LOMOTIL) 2.5-0.025 MG tablet, Take by mouth 4 (four) times daily as needed for diarrhea or loose stools., Disp: , Rfl:    Evolocumab (REPATHA SURECLICK) 140 MG/ML SOAJ, Inject 140 mg into the skin every 14 (fourteen) days., Disp: 2 mL, Rfl: 11   ezetimibe (ZETIA) 10 MG tablet, Take 1 tablet (10 mg total) by mouth daily., Disp: 90 tablet, Rfl: 3   ferrous sulfate 325 (65 FE) MG EC tablet, Take 325 mg by mouth 3 (three) times daily with meals., Disp: , Rfl:    furosemide (LASIX) 20 MG tablet, Take 10 mg by mouth daily. (Patient not taking: Reported on 02/10/2023), Disp: , Rfl:    gabapentin (NEURONTIN) 300 MG capsule, Take by mouth., Disp: , Rfl:    Krill Oil 350 MG CAPS, Take 350 mg by mouth daily as needed (Heart)., Disp: , Rfl:    mirtazapine (REMERON) 15 MG tablet, Take 15 mg by mouth at bedtime as needed (for panic associated with PTSD). , Disp: , Rfl:    Multiple Vitamin (MULTIVITAMIN WITH MINERALS) TABS tablet, Take 1 tablet by mouth daily., Disp: , Rfl:    nitroGLYCERIN (NITROSTAT) 0.4 MG SL tablet, Place 1 tablet (0.4 mg total) under the tongue every 5 (five) minutes as needed for chest pain., Disp: 25 tablet, Rfl: 6   ondansetron (ZOFRAN-ODT) 8 MG disintegrating tablet, Take 1 tablet (8 mg total) by mouth every 8 (eight) hours  as needed for nausea or vomiting., Disp: 20 tablet, Rfl: 0   pantoprazole (PROTONIX) 40 MG tablet, Take 1 tablet (40 mg total) by mouth daily., Disp: 30 tablet, Rfl: 2   tamsulosin (FLOMAX) 0.4 MG CAPS capsule, Take 1 capsule (0.4 mg total) by mouth daily., Disp: 30 capsule, Rfl: 3   Tiotropium Bromide Monohydrate 2.5 MCG/ACT AERS, Inhale 2 puffs into the lungs at bedtime. Spiriva, Disp: , Rfl:    traMADol (ULTRAM) 50 MG tablet, Take 1 tablet (50 mg total) by mouth every 12 (twelve) hours as needed., Disp: 60 tablet, Rfl: 0  Social History: Social History   Tobacco Use   Smoking status: Former    Current packs/day: 0.00    Average packs/day: 1 pack/day for 40.0 years (40.0 ttl pk-yrs)    Types: Cigarettes    Start date: 07/21/1966    Quit date: 07/21/2006    Years since quitting: 16.5   Smokeless tobacco: Never  Vaping Use   Vaping status: Former  Substance Use Topics   Alcohol use: Not Currently   Drug use: No    Family Medical History: Family History  Problem Relation Age of Onset   Heart disease Mother    Heart attack Mother    Coronary artery disease Other        family history    Physical Examination: Vitals:   02/10/23 1115  BP: 130/74    General: Patient is in no apparent distress. Attention to examination is appropriate.  Neck:   Supple.  Full range of motion.  Respiratory: Patient is breathing without any difficulty.   NEUROLOGICAL:     Awake, alert, oriented to person, place, and time.  Speech is clear and fluent.   Cranial Nerves: Pupils equal round and reactive to light.  Facial tone is symmetric.  Facial sensation is symmetric. Shoulder shrug is symmetric. Tongue protrusion is midline.  There is no pronator drift.  Strength: Side Biceps Triceps Deltoid Interossei Grip Wrist Ext. Wrist Flex.  R 5 5 5 4 4 5 5   L 5 5  5 4 4 5 5    Side Iliopsoas Quads Hamstring PF DF EHL  R 5 5 5 5 5 5   L 5 5 5 5 5 5    Reflexes are 3+ and symmetric at the biceps,  triceps, brachioradialis, patella and achilles.   Hoffman's present bilaterally 2 beats of clonus bilaterally Ambulates with a wide base gait with the assistance of a cane.   Medical Decision Making  Imaging: 12/08/22 MRI C, T and L spine FINDINGS: Transitional spinal anatomy with sacralization of L5 diminutive bilateral ribs at T12. Heterogeneous bone marrow signal intensity. Enhancing foci within the T12 vertebral body measure up to 0.8 cm (19:6) with corresponding STIR hyperintensity and surrounding fatty marrow change.   Prominent enhancement about the bilateral L2-L3 facets (19:7, 15) with corresponding STIR hyperintensity.  T2/STIR hyperintensity along the right lateral margin of the L1-L2 intervertebral disc with associated enhancement (19:5). Advanced desiccation desiccation and height loss in the cervical spine with partial ankylosis of C4 on C5 and C5 on C6. The cord is unremarkable and the conus medullaris ends at a normal level.  Minimal anterolisthesis of C6 on C7 and C7 on T1.  CERVICAL: C2-C3: Diffuse disc bulge. Uncovertebral spurring. Bilateral facet arthropathy. Mild spinal canal narrowing. Severe left, moderate right neural foraminal narrowing.  C3-C4: Disc bulge with osteophytic ridging, uncovertebral spurring, and bilateral facet arthropathy leading to severe spinal canal narrowing. Severe bilateral neural foraminal narrowing.  C4-C5: Disc osteophyte complex, uncovertebral spurring, bilateral facet arthropathy. Moderate spinal canal narrowing. Severe bilateral neural foraminal narrowing.  C5-C6: Near complete ankylosis of the C5-C6 vertebral bodies. No herniation. Bilateral uncovertebral hypertrophy and facet arthropathy. No spinal canal narrowing. Moderate left, mild right neural foraminal narrowing.  C6-C7: Disc osteophyte complex with superimposed central posterior disc herniation, uncovertebral spurring, and bilateral facet arthropathy. Severe spinal canal  narrowing. Severe right, moderate left neural foraminal narrowing.  C7-T1: Anterolisthesis of C7 on T1 leads to uncovering of the posterior and superior disc with a diffuse disc bulge and osteophytic ridging. Bilateral facet arthropathy and pronounced ligamentum flavum hypertrophy. Severe spinal canal narrowing. Severe bilateral neural foraminal narrowing.  THORACIC: Small posterior disc herniations at T1-T2 and T2-T3, facet arthropathy, and ligamentum flavum hypertrophy in the upper thoracic spine produce mild spinal canal narrowing at T1-T2 with moderate right and mild left neural foraminal narrowing and moderate spinal canal narrowing at T2-T3 with moderate bilateral neural foraminal narrowing. No significant spinal canal or neural foraminal narrowing from T3-T12.  LUMBAR: L1-L2: Small diffuse disc bulge with osteophytic ridging. Signal changes and enhancement along the right aspect of the disc as described above. Bilateral facet arthropathy and ligamentum flavum thickening. Mild spinal canal narrowing. Mild bilateral neural foraminal narrowing.  L2-L3: Diffuse disc bulge with facet arthropathy and ligamentum flavum hypertrophy leading to moderate spinal canal narrowing and narrowing of the lateral recesses. Mild bilateral neural foraminal narrowing.  L3-L4: Diffuse disc bulge. Bilateral facet arthropathy and ligamentum flavum thickening. Mild spinal canal narrowing. Moderate left, severe right neural foraminal narrowing.  L4-L5: Diffuse disc bulge with osteophytic ridging. Bilateral facet arthropathy and ligamentum flavum hypertrophy. Moderate left, severe right neural foraminal narrowing.  L5-S1: No herniation. No spinal canal narrowing. No neural foraminal narrowing.  Right renal cyst. Foley catheter within a distended bladder. Infrarenal abdominal aortic aneurysm measuring up to 3.0 cm (7:12).  IMPRESSION: --Multiple areas of abnormal enhancement in the lower lumbar spine. Enhancing foci  with surrounding fatty marrow change in the T12 vertebral body could represent atypical hemangiomas. Enhancement of  the L1-L2 intervertebral disc and L2-L3 bilateral facet could reflect early spondylodiscitis/septic/arthritis.  --Advanced degenerative changes of the cervical and lumbar spine as characterized above with severe spinal canal and neural foraminal narrowing at multiple levels. No abnormal cord signal abnormality.  --Infrarenal abdominal aortic aneurysm measuring up to 3.0 cm.    I have personally reviewed the images and agree with the above interpretation.  Assessment and Plan: Mr. Perrot is a very pleasant 77 y.o. male with both neck and leg pain with the absence of cervical radiculopathy and absent low back pain.  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  We discussed that this is unlikely to be the cause of his neck pain however he does have significant facet arthropathy which could explain his decreased range of motion and neck pain.  We discussed potentially sending him for facet injections and he is interested in this.  I will place a referral to Dr.Chasnis for this. I do think that he needs further evaluation by neurosurgeon discussed the risk and benefits of potentially intervening on his severe cervical stenosis.  We will get him set up to see Dr. Katrinka Blazing to discuss this. In regards to his lumbar spine, he does not have any back pain but he does have some significant findings on his MRI which could explain his leg pain and symptoms concerning with neurogenic claudication.  Given the severity of his cervical spine however and the fact that his neck is more bothersome to him, we will focus on treating his cervical spine initially.   Thank you for involving me in the care of this patient.   I spent a total of 45  minutes in both face-to-face and non-face-to-face activities for this visit on the date of this encounter including review of records, review of imaging, discussion of symptoms, discussion of differential diagnosis, physical exam, discussion of plan of care, and order placement.   Manning Charity PA-C Neurosurgery

## 2023-02-10 ENCOUNTER — Ambulatory Visit (INDEPENDENT_AMBULATORY_CARE_PROVIDER_SITE_OTHER): Payer: Medicare HMO | Admitting: Neurosurgery

## 2023-02-10 ENCOUNTER — Encounter: Payer: Self-pay | Admitting: Neurosurgery

## 2023-02-10 VITALS — BP 130/74 | Ht 68.5 in | Wt 201.8 lb

## 2023-02-10 DIAGNOSIS — M4712 Other spondylosis with myelopathy, cervical region: Secondary | ICD-10-CM

## 2023-02-10 DIAGNOSIS — M48062 Spinal stenosis, lumbar region with neurogenic claudication: Secondary | ICD-10-CM | POA: Diagnosis not present

## 2023-02-10 DIAGNOSIS — M542 Cervicalgia: Secondary | ICD-10-CM

## 2023-02-10 DIAGNOSIS — M4802 Spinal stenosis, cervical region: Secondary | ICD-10-CM

## 2023-02-13 NOTE — Progress Notes (Unsigned)
Referring Physician:  Susanne Borders, PA [address] [place],  [place] [address]  Primary Physician:  Glori Luis, MD  History of Present Illness: 02/24/2023 [name] is here today with a chief complaint of neck pain that occasionally goes into the bilateral shoulders.  What he has noticed that has been most debilitating has been his difficulty with his balance and ambulation.  He was previously seen in our outpatient clinic and was found to have significant myelopathic signs and symptoms.  His MRI demonstrated significant stenosis.  He states this has been going on progressively worse over the past 3 months but has been dealing with it for many years.  He states that the pain can get up to a 9 out of 10 this is mostly the pain radiating into his shoulders he has noticed a decreased ability to rotate his neck.  He also feels that this gets worse while he is walking.  He does not notice significant improvement.  Not having any bowel or bladder issues.  He did have home PT.  He also notes that he has been dropping stuff at a much higher rate than usual.  Conservative measures:  Physical therapy: he was doing home health PT for his gait/balance but has finished this Multimodal medical therapy including regular antiinflammatories: tramadol, tylenol Injections:  has not participated epidural steroid injections  Past Surgery: none  The symptoms are causing a significant impact on the patient's life.   I have utilized the care everywhere function in epic to review the outside records available from external health systems.  Review of Systems:  A 10 point review of systems is negative, except for the pertinent positives and negatives detailed in the HPI.  Past Medical History: Past Medical History:  Diagnosis Date   Anxiety    Arthritis    Atrial fibrillation (HCC)    a. Dx 2013, recurred 02/2014, CHA2DS2VASc = 3 -->placed on Eliquis;  b. 02/2014  Echo: EF 50-55%, mid and apical anterior septum and mid and apical inf septum are abnl, mild to mod Ao sclerosis w/o AS.   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 01/2021 LV Gram: EF 35-45%; b. 02/2021 Echo: EF 55-60%, no rwma, GrI DD, mildly reduced RV fxn, triv MR, mild-mod AoV sclerosis w/o stenosis.   Chronic lymphocytic leukemia (HCC)    a. Dx 02/2014.   Complication of anesthesia    History of  PTSD--do not touch patient when waking up from surgery.   COPD (chronic obstructive pulmonary disease) (HCC)    Coronary artery disease    a. 04/2009 CABG x 3 (LIMA->LAD, VG->OM1, VG->PDA);  b. 09/2009 Cath: occluded VG x 2 w/ patent LIMA and L->R collats. EF 55%, mild antlat HK;  c. 01/2021 Cath: LM mild diff dzs, LAD 100p, LCX nl, RCA 50p, 95d, 99 side branch, RPAV fills via LCX. VG->RPDA 100, VG->OM1 100, LIMA->LAD nl. EF 35-45%.   GERD (gastroesophageal reflux disease)    occasional   History of chemotherapy 2015-2016   HOH (hard of hearing)    Bilateral Hearing Aids   Hyperlipidemia LDL goal <70    Hypertension    Ischemic cardiomyopathy    a. 01/2021 LV Gram: EF 35-45%; b. 02/2021 Echo: EF 55-60%.   Myocardial infarction (HCC) 2010   OSA on CPAP    USE C-PAP   PTSD (post-traumatic stress  disorder)    Rheumatic fever 1959   SSS (sick sinus syndrome) (HCC)    a. 02/2016 s/p MDT MRI compatible DC PPM (ser# [id number]).   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   Stroke [facility])    TIA (transient ischemic attack) 11/02/2015    Past Surgical History: Past Surgical History:  Procedure Laterality Date   ABDOMINAL HERNIA REPAIR     APPENDECTOMY  06/21/1985   CARDIAC CATHETERIZATION  2010; 2011   ; Dr Kirke Corin   CORONARY ARTERY BYPASS GRAFT  04/2009   "CABG X3"   EP IMPLANTABLE DEVICE N/A 03/03/2016   Procedure: Pacemaker Implant;  Surgeon: Duke Salvia, MD;  Location: [facility] INVASIVE CV LAB;  Service:  Cardiovascular;  Laterality: N/A;   FOREIGN BODY REMOVAL  1968   "shrapnel in my tailbone"   INGUINAL HERNIA REPAIR Right    INSERT / REPLACE / REMOVE PACEMAKER     JOINT REPLACEMENT Right 2018   LAPAROSCOPIC CHOLECYSTECTOMY     RIGHT/LEFT HEART CATH AND CORONARY/GRAFT ANGIOGRAPHY N/A 02/04/2021   Procedure: RIGHT/LEFT HEART CATH AND CORONARY/GRAFT ANGIOGRAPHY;  Surgeon: Yvonne Kendall, MD;  Location: [facility] INVASIVE CV LAB;  Service: Cardiovascular;  Laterality: N/A;   TONSILLECTOMY AND ADENOIDECTOMY  1956   TOTAL HIP ARTHROPLASTY Right 10/22/2016   Procedure: TOTAL HIP ARTHROPLASTY;  Surgeon: Donato Heinz, MD;  Location: [facility] ORS;  Service: Orthopedics;  Laterality: Right;   TOTAL HIP ARTHROPLASTY Left 11/04/2017   Procedure: TOTAL HIP ARTHROPLASTY;  Surgeon: Donato Heinz, MD;  Location: [facility] ORS;  Service: Orthopedics;  Laterality: Left;    Allergies: Allergies as of 02/24/2023 - Review Complete 02/24/2023  Allergen Reaction Noted   Atorvastatin Other (See Comments) 08/25/2022   Augmentin [amoxicillin-pot clavulanate]  04/17/2022    Medications:  Current Outpatient Medications:    acalabrutinib maleate (CALQUENCE) 100 MG tablet, Take 1 tablet (100 mg total) by mouth 2 (two) times daily., Disp: 60 tablet, Rfl: 2   acetaminophen (TYLENOL) 325 MG tablet, Take 1-2 tablets (325-650 mg total) by mouth every 4 (four) hours as needed for mild pain., Disp: , Rfl:    acyclovir (ZOVIRAX) 400 MG tablet, TAKE 1 TABLET(400 MG) BY MOUTH TWICE DAILY, Disp: 60 tablet, Rfl: 6   albuterol (VENTOLIN HFA) 108 (90 Base) MCG/ACT inhaler, Inhale 2 puffs into the lungs every 6 (six) hours as needed for wheezing or shortness of breath., Disp: 1 each, Rfl: 0   amiodarone (PACERONE) 200 MG tablet, Take 1/2 tablet (100 mg) by mouth twice daily 5 days a week, Disp: 60 tablet, Rfl: 6   apixaban (ELIQUIS) 5 MG TABS tablet, Take 5 mg by mouth 2 (two) times daily., Disp: , Rfl:    azelastine (ASTELIN) 0.1 % nasal  spray, Place 2 sprays into both nostrils 2 (two) times daily. Use in each nostril as directed, Disp: 30 mL, Rfl: 1   benzonatate (TESSALON) 100 MG capsule, Take 1 capsule (100 mg total) by mouth 3 (three) times daily as needed for cough., Disp: 60 capsule, Rfl: 0   budesonide-formoterol (SYMBICORT) 160-4.5 MCG/ACT inhaler, Inhale 2 puffs into the lungs 2 (two) times daily., Disp: 1 Inhaler, Rfl: 11   cetirizine (ZYRTEC) 10 MG tablet, Take 10 mg by mouth daily as needed for allergies. , Disp: , Rfl:    Coenzyme Q10 (COQ10) 200 MG CAPS, Take 200 mg by mouth daily., Disp: , Rfl:    diphenoxylate-atropine (LOMOTIL) 2.5-0.025 MG tablet, Take by mouth 4 (four) times daily as needed for  diarrhea or loose stools., Disp: , Rfl:    Evolocumab (REPATHA SURECLICK) 140 MG/ML SOAJ, Inject 140 mg into the skin every 14 (fourteen) days., Disp: 2 mL, Rfl: 11   ezetimibe (ZETIA) 10 MG tablet, Take 1 tablet (10 mg total) by mouth daily., Disp: 90 tablet, Rfl: 3   ferrous sulfate 325 (65 FE) MG EC tablet, Take 325 mg by mouth 3 (three) times daily with meals., Disp: , Rfl:    gabapentin (NEURONTIN) 300 MG capsule, Take by mouth., Disp: , Rfl:    Krill Oil 350 MG CAPS, Take 350 mg by mouth daily as needed (Heart)., Disp: , Rfl:    mirtazapine (REMERON) 15 MG tablet, Take 15 mg by mouth at bedtime as needed (for panic associated with PTSD). , Disp: , Rfl:    Multiple Vitamin (MULTIVITAMIN WITH MINERALS) TABS tablet, Take 1 tablet by mouth daily., Disp: , Rfl:    nitroGLYCERIN (NITROSTAT) 0.4 MG SL tablet, Place 1 tablet (0.4 mg total) under the tongue every 5 (five) minutes as needed for chest pain., Disp: 25 tablet, Rfl: 6   ondansetron (ZOFRAN-ODT) 8 MG disintegrating tablet, Take 1 tablet (8 mg total) by mouth every 8 (eight) hours as needed for nausea or vomiting., Disp: 20 tablet, Rfl: 0   pantoprazole (PROTONIX) 40 MG tablet, Take 1 tablet (40 mg total) by mouth daily., Disp: 30 tablet, Rfl: 2   tamsulosin (FLOMAX)  0.4 MG CAPS capsule, Take 1 capsule (0.4 mg total) by mouth daily., Disp: 30 capsule, Rfl: 3   Tiotropium Bromide Monohydrate 2.5 MCG/ACT AERS, Inhale 2 puffs into the lungs at bedtime. Spiriva, Disp: , Rfl:    traMADol (ULTRAM) 50 MG tablet, Take 1 tablet (50 mg total) by mouth every 12 (twelve) hours as needed., Disp: 60 tablet, Rfl: 0   furosemide (LASIX) 20 MG tablet, Take 10 mg by mouth daily. (Patient not taking: Reported on 02/24/2023), Disp: , Rfl:   Social History: Social History   Tobacco Use   Smoking status: Former    Current packs/day: 0.00    Average packs/day: 1 pack/day for 40.0 years (40.0 ttl pk-yrs)    Types: Cigarettes    Start date: 07/21/1966    Quit date: 07/21/2006    Years since quitting: 16.6   Smokeless tobacco: Never  Vaping Use   Vaping status: Former  Substance Use Topics   Alcohol use: Not Currently   Drug use: No    Family Medical History: Family History  Problem Relation Age of Onset   Heart disease Mother    Heart attack Mother    Coronary artery disease Other        family history    Physical Examination: Vitals:   02/24/23 1114  BP: 134/72    General: Patient is in no apparent distress. Attention to examination is appropriate.  Neck:   Supple.  Full range of motion.  Respiratory: Patient is breathing without any difficulty.   NEUROLOGICAL:     Awake, alert, oriented to person, place, and time.  Speech is clear and fluent.   Cranial Nerves: Pupils equal round and reactive to light.  Facial tone is symmetric.  Facial sensation is symmetric. Shoulder shrug is symmetric. Tongue protrusion is midline.    Strength: His bilateral upper and lower extremities show at least 4+ to 5 strength throughout.  The most notable deficit is in the left hip flexor which appears to be approximately 4 out of 5.  Reflexes are 3+ at the bilateral Achilles  and patellas.  He has positive clonus bilaterally.  In the upper extremities he does have spreading of  the brachial radialis reflex.  His biceps reflexes 2+, triceps reflexes 2+, left-sided Hoffmann's is present.   He does discuss some decrease sensation in the bilateral lower extremities that has been ascending.     No evidence of dysmetria noted.  Gait is abnormal, uses a cane for ambulation given lack of balance.  Imaging:  I have personally reviewed the images, he continues to have severe stenosis at the C3-4 junction.  He also has multilevel spondylosis but appears to have an autofusion C4-6.  But from C6-T1 there continues to be significant stenosis with effacement of the CSF space and compression of the spinal cord at these levels.  Medical Decision Making/Assessment and Plan: Mr. Bloyer is a pleasant 77 y.o. male with chronic neck issues, however worsening myelopathy that he has noticed a sharp decline in the past 3 months.  He has noticed difficulty with dropping things.  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  He is also having decreased ability to ambulate.  He often feels off balance and that his walking is getting worse.  He does have a cane for ambulation.  On physical exam he demonstrates diffuse hyperreflexia with crossed adductor signs, clonus bilaterally, 3+ with spreading reflexes in the bilateral lower extremities, 3+ at the brachioradialis but 2+ at the biceps and triceps.  He does have a positive Hoffmann sign on the left.  His imaging demonstrates significant stenosis at the C3-4 level as well as the C6-T1 level.  He has some mild spondylosis at the levels intervening.  Does appear to have a autofusion at those levels.  He has an exaggerated upper thoracic kyphosis with a slight loss of cervical lordosis.  Will plan on getting x-rays, will review for any mobile spondylolisthesis.  Given his health and autofusion throughout, we would plan to discuss possible cervical laminoplasty versus posterior cervical decompression and fusion.  At this point the patient  would prefer to have his little instrumentation as possible.  Will discuss this with our partners while were waiting for the x-ray results.  Will look for timing, he will require cardiac clearance.  Thank you for involving me in the care of this patient.   Lovenia Kim MD/MSCR Neurosurgery

## 2023-02-20 NOTE — Progress Notes (Signed)
Remote pacemaker transmission.   

## 2023-02-23 ENCOUNTER — Other Ambulatory Visit: Payer: Self-pay | Admitting: Internal Medicine

## 2023-02-23 ENCOUNTER — Other Ambulatory Visit: Payer: Self-pay

## 2023-02-23 DIAGNOSIS — C9110 Chronic lymphocytic leukemia of B-cell type not having achieved remission: Secondary | ICD-10-CM

## 2023-02-23 MED ORDER — CALQUENCE 100 MG PO TABS
100.0000 mg | ORAL_TABLET | Freq: Two times a day (BID) | ORAL | 2 refills | Status: DC
Start: 2023-02-23 — End: 2023-04-25
  Filled 2023-02-23: qty 60, 30d supply, fill #0
  Filled 2023-04-10: qty 60, 30d supply, fill #1

## 2023-02-24 ENCOUNTER — Ambulatory Visit
Admission: RE | Admit: 2023-02-24 | Discharge: 2023-02-24 | Disposition: A | Discharge status: Home / Self Care | Payer: Medicare HMO | Attending: Acute Care | Admitting: Acute Care

## 2023-02-24 ENCOUNTER — Ambulatory Visit
Admission: RE | Admit: 2023-02-24 | Discharge: 2023-02-24 | Disposition: A | Discharge status: Home / Self Care | Payer: Medicare HMO | Source: Ambulatory Visit | Attending: Neurosurgery | Admitting: Neurosurgery

## 2023-02-24 ENCOUNTER — Encounter: Payer: Self-pay | Admitting: Neurosurgery

## 2023-02-24 ENCOUNTER — Ambulatory Visit (INDEPENDENT_AMBULATORY_CARE_PROVIDER_SITE_OTHER): Payer: Medicare HMO | Admitting: Neurosurgery

## 2023-02-24 VITALS — BP 134/72 | Ht 68.5 in | Wt 203.0 lb

## 2023-02-24 DIAGNOSIS — M4712 Other spondylosis with myelopathy, cervical region: Secondary | ICD-10-CM | POA: Insufficient documentation

## 2023-02-24 DIAGNOSIS — M40204 Unspecified kyphosis, thoracic region: Secondary | ICD-10-CM

## 2023-02-25 ENCOUNTER — Other Ambulatory Visit: Payer: Medicare HMO

## 2023-02-25 ENCOUNTER — Ambulatory Visit: Payer: Medicare HMO | Admitting: Internal Medicine

## 2023-02-25 ENCOUNTER — Inpatient Hospital Stay: Payer: Medicare HMO | Attending: Internal Medicine

## 2023-02-25 ENCOUNTER — Other Ambulatory Visit (HOSPITAL_COMMUNITY): Payer: Self-pay

## 2023-02-25 ENCOUNTER — Encounter: Payer: Self-pay | Admitting: Internal Medicine

## 2023-02-25 ENCOUNTER — Inpatient Hospital Stay: Payer: Medicare HMO | Admitting: Internal Medicine

## 2023-02-25 VITALS — BP 121/63 | HR 85 | Temp 97.7°F | Ht 68.5 in | Wt 203.0 lb

## 2023-02-25 DIAGNOSIS — C9112 Chronic lymphocytic leukemia of B-cell type in relapse: Secondary | ICD-10-CM | POA: Insufficient documentation

## 2023-02-25 DIAGNOSIS — M542 Cervicalgia: Secondary | ICD-10-CM | POA: Diagnosis not present

## 2023-02-25 DIAGNOSIS — Z87891 Personal history of nicotine dependence: Secondary | ICD-10-CM | POA: Diagnosis not present

## 2023-02-25 DIAGNOSIS — Z79899 Other long term (current) drug therapy: Secondary | ICD-10-CM | POA: Insufficient documentation

## 2023-02-25 DIAGNOSIS — C9110 Chronic lymphocytic leukemia of B-cell type not having achieved remission: Secondary | ICD-10-CM

## 2023-02-25 DIAGNOSIS — G8929 Other chronic pain: Secondary | ICD-10-CM | POA: Diagnosis not present

## 2023-02-25 DIAGNOSIS — Z7901 Long term (current) use of anticoagulants: Secondary | ICD-10-CM | POA: Diagnosis not present

## 2023-02-25 DIAGNOSIS — M2550 Pain in unspecified joint: Secondary | ICD-10-CM | POA: Diagnosis not present

## 2023-02-25 DIAGNOSIS — I4891 Unspecified atrial fibrillation: Secondary | ICD-10-CM | POA: Diagnosis not present

## 2023-02-25 DIAGNOSIS — D649 Anemia, unspecified: Secondary | ICD-10-CM | POA: Insufficient documentation

## 2023-02-25 LAB — CBC WITH DIFFERENTIAL (CANCER CENTER ONLY)
Abs Immature Granulocytes: 0.03 10*3/uL (ref 0.00–0.07)
Basophils Absolute: 0 10*3/uL (ref 0.0–0.1)
Basophils Relative: 1 %
Eosinophils Absolute: 0.3 10*3/uL (ref 0.0–0.5)
Eosinophils Relative: 4 %
HCT: 33.1 % — ABNORMAL LOW (ref 39.0–52.0)
Hemoglobin: 10.8 g/dL — ABNORMAL LOW (ref 13.0–17.0)
Immature Granulocytes: 0 %
Lymphocytes Relative: 47 %
Lymphs Abs: 3.4 10*3/uL (ref 0.7–4.0)
MCH: 34 pg (ref 26.0–34.0)
MCHC: 32.6 g/dL (ref 30.0–36.0)
MCV: 104.1 fL — ABNORMAL HIGH (ref 80.0–100.0)
Monocytes Absolute: 0.4 10*3/uL (ref 0.1–1.0)
Monocytes Relative: 5 %
Neutro Abs: 3.1 10*3/uL (ref 1.7–7.7)
Neutrophils Relative %: 43 %
Platelet Count: 135 10*3/uL — ABNORMAL LOW (ref 150–400)
RBC: 3.18 MIL/uL — ABNORMAL LOW (ref 4.22–5.81)
RDW: 13.8 % (ref 11.5–15.5)
WBC Count: 7.3 10*3/uL (ref 4.0–10.5)
nRBC: 0 % (ref 0.0–0.2)

## 2023-02-25 LAB — RETICULOCYTES
Immature Retic Fract: 18.4 % — ABNORMAL HIGH (ref 2.3–15.9)
RBC.: 3.2 MIL/uL — ABNORMAL LOW (ref 4.22–5.81)
Retic Count, Absolute: 86.7 10*3/uL (ref 19.0–186.0)
Retic Ct Pct: 2.7 % (ref 0.4–3.1)

## 2023-02-25 LAB — CMP (CANCER CENTER ONLY)
ALT: 16 U/L (ref 0–44)
AST: 18 U/L (ref 15–41)
Albumin: 3.9 g/dL (ref 3.5–5.0)
Alkaline Phosphatase: 71 U/L (ref 38–126)
Anion gap: 5 (ref 5–15)
BUN: 24 mg/dL — ABNORMAL HIGH (ref 8–23)
CO2: 24 mmol/L (ref 22–32)
Calcium: 8.7 mg/dL — ABNORMAL LOW (ref 8.9–10.3)
Chloride: 110 mmol/L (ref 98–111)
Creatinine: 1.13 mg/dL (ref 0.61–1.24)
GFR, Estimated: >60 mL/min (ref >60)
Glucose, Bld: 105 mg/dL — ABNORMAL HIGH (ref 70–99)
Potassium: 3.8 mmol/L (ref 3.5–5.1)
Sodium: 139 mmol/L (ref 135–145)
Total Bilirubin: 0.8 mg/dL (ref 0.3–1.2)
Total Protein: 6.7 g/dL (ref 6.5–8.1)

## 2023-02-25 LAB — LACTATE DEHYDROGENASE: LDH: 131 U/L (ref 98–192)

## 2023-02-25 LAB — FOLATE: Folate: 12 ng/mL (ref >5.9)

## 2023-02-25 LAB — VITAMIN B12: Vitamin B-12: 3641 pg/mL — ABNORMAL HIGH (ref 180–914)

## 2023-02-25 MED ORDER — TRAMADOL HCL 50 MG PO TABS
50.0000 mg | ORAL_TABLET | Freq: Two times a day (BID) | ORAL | 0 refills | Status: DC | PRN
Start: 2023-02-25 — End: 2023-04-25

## 2023-02-25 NOTE — Progress Notes (Signed)
[facility] OFFICE PROGRESS NOTE  Patient Care Team: Glori Luis, MD as PCP - General (Family Medicine) Mariah Milling Tollie Pizza, MD as PCP - Cardiology (Cardiology) Antonieta Iba, MD as Consulting Physician (Cardiology) Earna Coder, MD as Medical Oncologist (Hematology and Oncology) Keitha Butte, RN as Registered Nurse (Oncology)   Cancer Staging  No matching staging information was found for the patient.    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  MARCH 2017- CT N/C/A/P- NED. STOP Ritux; surveillance   # AUG 2019- CT/PET- progression/NO transformation;  # NOV 2019- Progression; started Ibrutinib 420 mg/d. STOPPED in end of feb sec to extreme fatigue/joint pains/cramps  #November 01, 2018-start ibrutinib 280 mg a day; December 20, 2018-discontinue ibrutinib secondary multiple side effects.   #January 03, 2019 start Gazyva + Ven [July]; finished Dec 2020-; started Peter Minium maintenance [200 mg a day; DI-amiodarone]; SEP 2022- congestive heart failure; s/p cardiac cath-noted to have CAD however no stents placed.  2D echo- EF-55%  #Mid March 2021-venetoclax dose reduced to 100 mg [second diarrhea]. HELD in DEC 2022/JAN 2023 x3 months-multiple admissions to the hospital/pneumonia infection stroke x 3 m.   # MAY 2023-progression of disease in the neck ches; Nov 18, 2021-RE-started venetoclax;   # JUNE 29th, 2023-progression of disease in the neck- STOP VENAOCLAX;   # AUG 16th, 2023 START ACALABRUTINIB 100 mg BID [previous intolerance to ibrutinib; delayed sec to C.diff]    #? 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  Ambulating with a cane.   [name] 77 y.o.  male CLL-high risk of recurrent/relapsed currently on  alcabrutinib is here for a follow up.   S/P evaluation with Neurosurgery/ and physiatry- for neck pain/dizzy spells. Awaiting surgery on the neck later this month.   No night sweats.  Appetite is normal. Energy is up and down. Complains of chronic joint pain.  Otherwise no fever no chills.  No nausea no vomiting.  No headache.  Review of Systems  Constitutional:  Positive for malaise/fatigue. Negative for chills, diaphoresis and fever.  HENT:  Negative for nosebleeds and sore throat.   Eyes:  Negative for double vision.  Respiratory:  Negative for hemoptysis, shortness of breath and wheezing.   Cardiovascular:  Negative for chest pain, palpitations, orthopnea and leg swelling.  Gastrointestinal:  Negative for abdominal pain, blood in stool, constipation, heartburn, melena, nausea and vomiting.  Genitourinary:  Negative for dysuria, frequency and urgency.  Musculoskeletal:  Positive for back pain, joint pain and myalgias.  Skin: Negative.  Negative for itching and rash.  Neurological:  Negative for dizziness, tingling, focal weakness and headaches.  Psychiatric/Behavioral:  Negative for depression. The patient is not nervous/anxious and does not have insomnia.  PAST MEDICAL HISTORY :  Past Medical History:  Diagnosis Date   Anxiety    Arthritis    Atrial fibrillation (HCC)    a. Dx 2013, recurred 02/2014, CHA2DS2VASc = 3 -->placed on Eliquis;  b.  02/2014 Echo: EF 50-55%, mid and apical anterior septum and mid and apical inf septum are abnl, mild to mod Ao sclerosis w/o AS.   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 01/2021 LV Gram: EF 35-45%; b. 02/2021 Echo: EF 55-60%, no rwma, GrI DD, mildly reduced RV fxn, triv MR, mild-mod AoV sclerosis w/o stenosis.   Chronic lymphocytic leukemia (HCC)    a. Dx 02/2014.   Complication of anesthesia    History of  PTSD--do not touch patient when waking up from surgery.   COPD (chronic obstructive pulmonary disease) (HCC)    Coronary artery disease    a. 04/2009 CABG x 3 (LIMA->LAD, VG->OM1, VG->PDA);  b. 09/2009 Cath: occluded VG x 2 w/ patent LIMA and L->R collats. EF 55%, mild antlat HK;  c. 01/2021 Cath: LM mild diff dzs, LAD 100p, LCX nl, RCA 50p, 95d, 99 side branch, RPAV fills via LCX. VG->RPDA 100, VG->OM1 100, LIMA->LAD nl. EF 35-45%.   GERD (gastroesophageal reflux disease)    occasional   History of chemotherapy 2015-2016   HOH (hard of hearing)    Bilateral Hearing Aids   Hyperlipidemia LDL goal <70    Hypertension    Ischemic cardiomyopathy    a. 01/2021 LV Gram: EF 35-45%; b. 02/2021 Echo: EF 55-60%.   Myocardial infarction (HCC) 2010   OSA on CPAP    USE C-PAP   PTSD (post-traumatic stress disorder)    Rheumatic fever 1959   SSS (sick sinus syndrome) (HCC)    a. 02/2016 s/p MDT MRI compatible DC PPM (ser# [id number]).   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   Stroke [facility])    TIA (transient ischemic attack) 11/02/2015   PAST SURGICAL HISTORY :   Past Surgical History:  Procedure Laterality Date   ABDOMINAL HERNIA REPAIR     APPENDECTOMY  06/21/1985   CARDIAC CATHETERIZATION  2010; 2011   ; Dr Kirke Corin   CORONARY ARTERY BYPASS GRAFT  04/2009   "CABG X3"   EP IMPLANTABLE DEVICE N/A 03/03/2016   Procedure: Pacemaker Implant;  Surgeon: Duke Salvia, MD;  Location: [facility] INVASIVE CV LAB;   Service: Cardiovascular;  Laterality: N/A;   FOREIGN BODY REMOVAL  1968   "shrapnel in my tailbone"   INGUINAL HERNIA REPAIR Right    INSERT / REPLACE / REMOVE PACEMAKER     JOINT REPLACEMENT Right 2018   LAPAROSCOPIC CHOLECYSTECTOMY     RIGHT/LEFT HEART CATH AND CORONARY/GRAFT ANGIOGRAPHY N/A 02/04/2021   Procedure: RIGHT/LEFT HEART CATH AND CORONARY/GRAFT ANGIOGRAPHY;  Surgeon: Yvonne Kendall, MD;  Location: [facility] INVASIVE CV LAB;  Service: Cardiovascular;  Laterality: N/A;   TONSILLECTOMY AND ADENOIDECTOMY  1956   TOTAL HIP ARTHROPLASTY Right 10/22/2016   Procedure: TOTAL HIP ARTHROPLASTY;  Surgeon: Donato Heinz, MD;  Location: [facility] ORS;  Service: Orthopedics;  Laterality: Right;   TOTAL HIP ARTHROPLASTY Left 11/04/2017   Procedure: TOTAL HIP ARTHROPLASTY;  Surgeon: Donato Heinz, MD;  Location: [facility] ORS;  Service: Orthopedics;  Laterality: Left;    FAMILY HISTORY :   Family History  Problem Relation Age of Onset   Heart disease Mother    Heart attack  Mother    Coronary artery disease Other        family history    SOCIAL HISTORY:   Social History   Tobacco Use   Smoking status: Former    Current packs/day: 0.00    Average packs/day: 1 pack/day for 40.0 years (40.0 ttl pk-yrs)    Types: Cigarettes    Start date: 07/21/1966    Quit date: 07/21/2006    Years since quitting: 16.6   Smokeless tobacco: Never  Vaping Use   Vaping status: Former  Substance Use Topics   Alcohol use: Not Currently   Drug use: No    ALLERGIES:  is allergic to atorvastatin and augmentin [amoxicillin-pot clavulanate].  MEDICATIONS:  Current Outpatient Medications  Medication Sig Dispense Refill   acalabrutinib maleate (CALQUENCE) 100 MG tablet Take 1 tablet (100 mg total) by mouth 2 (two) times daily. 60 tablet 2   acetaminophen (TYLENOL) 325 MG tablet Take 1-2 tablets (325-650 mg total) by mouth every 4 (four) hours as needed for mild pain.     acyclovir (ZOVIRAX) 400 MG tablet TAKE 1 TABLET(400  MG) BY MOUTH TWICE DAILY 60 tablet 6   albuterol (VENTOLIN HFA) 108 (90 Base) MCG/ACT inhaler Inhale 2 puffs into the lungs every 6 (six) hours as needed for wheezing or shortness of breath. 1 each 0   amiodarone (PACERONE) 200 MG tablet Take 1/2 tablet (100 mg) by mouth twice daily 5 days a week 60 tablet 6   apixaban (ELIQUIS) 5 MG TABS tablet Take 5 mg by mouth 2 (two) times daily.     azelastine (ASTELIN) 0.1 % nasal spray Place 2 sprays into both nostrils 2 (two) times daily. Use in each nostril as directed 30 mL 1   benzonatate (TESSALON) 100 MG capsule Take 1 capsule (100 mg total) by mouth 3 (three) times daily as needed for cough. 60 capsule 0   budesonide-formoterol (SYMBICORT) 160-4.5 MCG/ACT inhaler Inhale 2 puffs into the lungs 2 (two) times daily. 1 Inhaler 11   cetirizine (ZYRTEC) 10 MG tablet Take 10 mg by mouth daily as needed for allergies.      Coenzyme Q10 (COQ10) 200 MG CAPS Take 200 mg by mouth daily.     diphenoxylate-atropine (LOMOTIL) 2.5-0.025 MG tablet Take by mouth 4 (four) times daily as needed for diarrhea or loose stools.     Evolocumab (REPATHA SURECLICK) 140 MG/ML SOAJ Inject 140 mg into the skin every 14 (fourteen) days. 2 mL 11   ezetimibe (ZETIA) 10 MG tablet Take 1 tablet (10 mg total) by mouth daily. 90 tablet 3   ferrous sulfate 325 (65 FE) MG EC tablet Take 325 mg by mouth 3 (three) times daily with meals.     gabapentin (NEURONTIN) 300 MG capsule Take by mouth.     Krill Oil 350 MG CAPS Take 350 mg by mouth daily as needed (Heart).     mirtazapine (REMERON) 15 MG tablet Take 15 mg by mouth at bedtime as needed (for panic associated with PTSD).      Multiple Vitamin (MULTIVITAMIN WITH MINERALS) TABS tablet Take 1 tablet by mouth daily.     nitroGLYCERIN (NITROSTAT) 0.4 MG SL tablet Place 1 tablet (0.4 mg total) under the tongue every 5 (five) minutes as needed for chest pain. 25 tablet 6   ondansetron (ZOFRAN-ODT) 8 MG disintegrating tablet Take 1 tablet (8  mg total) by mouth every 8 (eight) hours as needed for nausea or vomiting. 20 tablet 0   pantoprazole (PROTONIX)  40 MG tablet Take 1 tablet (40 mg total) by mouth daily. 30 tablet 2   tamsulosin (FLOMAX) 0.4 MG CAPS capsule Take 1 capsule (0.4 mg total) by mouth daily. 30 capsule 3   Tiotropium Bromide Monohydrate 2.5 MCG/ACT AERS Inhale 2 puffs into the lungs at bedtime. Spiriva     furosemide (LASIX) 20 MG tablet Take 10 mg by mouth daily. (Patient not taking: Reported on 02/24/2023)     traMADol (ULTRAM) 50 MG tablet Take 1 tablet (50 mg total) by mouth every 12 (twelve) hours as needed. 60 tablet 0   No current facility-administered medications for this visit.    PHYSICAL EXAMINATION: ECOG PERFORMANCE STATUS: 1 - Symptomatic but completely ambulatory  BP 121/63 (BP Location: Left Arm, Patient Position: Sitting, Cuff Size: Normal)   Pulse 85   Temp 97.7 F (36.5 C) (Oral)   Ht 5' 8.5" (1.74 m)   Wt 203 lb (92.1 kg)   SpO2 99%   BMI 30.42 kg/m   Filed Weights   02/25/23 1004  Weight: 203 lb (92.1 kg)    Physical Exam HENT:     Head: Normocephalic and atraumatic.     Mouth/Throat:     Pharynx: No oropharyngeal exudate.  Eyes:     Pupils: Pupils are equal, round, and reactive to light.  Cardiovascular:     Rate and Rhythm: Normal rate and regular rhythm.  Pulmonary:     Effort: No respiratory distress.     Breath sounds: No wheezing.  Abdominal:     General: Bowel sounds are normal. There is no distension.     Palpations: Abdomen is soft. There is no mass.     Tenderness: There is no abdominal tenderness. There is no guarding or rebound.  Musculoskeletal:        General: No tenderness. Normal range of motion.     Cervical back: Normal range of motion and neck supple.  Skin:    General: Skin is warm.  Neurological:     Mental Status: He is alert and oriented to person, place, and time.  Psychiatric:        Mood and Affect: Affect normal.     LABORATORY DATA:  I  have reviewed the data as listed    Component Value Date/Time   NA 139 02/25/2023 1004   NA 143 03/11/2021 1136   NA 139 10/11/2014 1800   K 3.8 02/25/2023 1004   K 3.3 (L) 10/11/2014 1800   CL 110 02/25/2023 1004   CL 106 10/11/2014 1800   CO2 24 02/25/2023 1004   CO2 27 10/11/2014 1800   GLUCOSE 105 (H) 02/25/2023 1004   GLUCOSE 107 (H) 10/11/2014 1800   BUN 24 (H) 02/25/2023 1004   BUN 13 03/11/2021 1136   BUN 15 10/11/2014 1800   CREATININE 1.13 02/25/2023 1004   CREATININE 0.89 10/11/2014 1800   CALCIUM 8.7 (L) 02/25/2023 1004   CALCIUM 8.8 (L) 10/11/2014 1800   PROT 6.7 02/25/2023 1004   PROT 6.7 05/18/2017 1048   PROT 6.4 (L) 10/11/2014 1800   ALBUMIN 3.9 02/25/2023 1004   ALBUMIN 4.3 05/18/2017 1048   ALBUMIN 4.1 10/11/2014 1800   AST 18 02/25/2023 1004   ALT 16 02/25/2023 1004   ALT 22 10/11/2014 1800   ALKPHOS 71 02/25/2023 1004   ALKPHOS 61 10/11/2014 1800   BILITOT 0.8 02/25/2023 1004   GFRNONAA >60 02/25/2023 1004   GFRNONAA >60 10/11/2014 1800   GFRAA >60 04/12/2020 0959   GFRAA >  60 10/11/2014 1800    No results found for: "SPEP", "UPEP"  Lab Results  Component Value Date   WBC 7.3 02/25/2023   NEUTROABS 3.1 02/25/2023   HGB 10.8 (L) 02/25/2023   HCT 33.1 (L) 02/25/2023   MCV 104.1 (H) 02/25/2023   PLT 135 (L) 02/25/2023      Chemistry      Component Value Date/Time   NA 139 02/25/2023 1004   NA 143 03/11/2021 1136   NA 139 10/11/2014 1800   K 3.8 02/25/2023 1004   K 3.3 (L) 10/11/2014 1800   CL 110 02/25/2023 1004   CL 106 10/11/2014 1800   CO2 24 02/25/2023 1004   CO2 27 10/11/2014 1800   BUN 24 (H) 02/25/2023 1004   BUN 13 03/11/2021 1136   BUN 15 10/11/2014 1800   CREATININE 1.13 02/25/2023 1004   CREATININE 0.89 10/11/2014 1800      Component Value Date/Time   CALCIUM 8.7 (L) 02/25/2023 1004   CALCIUM 8.8 (L) 10/11/2014 1800   ALKPHOS 71 02/25/2023 1004   ALKPHOS 61 10/11/2014 1800   AST 18 02/25/2023 1004   ALT 16  02/25/2023 1004   ALT 22 10/11/2014 1800   BILITOT 0.8 02/25/2023 1004       RADIOGRAPHIC STUDIES: I have personally reviewed the radiological images as listed and agreed with the findings in the report. No results found.   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  Patient currently on Acalabrutnib [AUG 16th, 2023]-  Patient admits to compliance; JUNE 13th, 2024- [compared to CT JAN 2024] -Increased adenopathy above and below the diaphragm compatible with progressive lymphomatous disease.  No splenomegaly. However, patient had interruption of his  acal just prior to his CT scan.  Clinically noted to have improvement of his neck lymph nodes.  Continue Acalbrutinib 100 mg BID.   #  For now- will repeat imaging in 3-4 months; will order imaging at next visit.   # Neck pain/Joint pains/ Muscle pain- s/p evluation with  Dr.Chasnis/ Dr.Brandon Katrinka Blazing; [facility]. Awaiting surgery end of the month. Continue tramadol/CBD prn; refilled-stable. Will recommending withholding acalabrutinib treatment for 7 days before and after surgery [a total of 2 weeks]. Discussed with Neurosurgery.   # Mild Anemia Hb ~10-11- NOV 2023- iron sat- 17%; ferritin-71 continue gentle iron/slow iron. Monitor for now. If  get worse- recommend bone marrow- stable.    # Low immunoglobulins IgG 330/recent severe respiratory infection/CLL-s/pI IVIG #4/4 [last April 2022].MARCH 2024-- IgG > 1500- stable.    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   DISPOSITION: # follow up in 2 month-  MD; labs- cbc/cmp; LDH; B12;iron studies; ferritin; retic count;  Dr.B    Orders Placed This Encounter  Procedures   CBC with Differential (Cancer Center Only)    Standing Status:   Future    Standing  Expiration Date:   02/25/2024   CMP (Cancer Center only)    Standing Status:   Future    Standing Expiration Date:   02/25/2024   Iron and TIBC    Standing Status:   Future    Standing Expiration Date:   02/25/2024   Ferritin    Standing Status:   Future    Standing Expiration Date:   02/25/2024   Retic Panel  Standing Status:   Future    Standing Expiration Date:   02/25/2024   Lactate dehydrogenase    Standing Status:   Future    Standing Expiration Date:   02/25/2024   All questions were answered. The patient knows to call the clinic with any problems, questions or concerns.     Earna Coder, MD 02/25/2023 11:17 AM

## 2023-02-25 NOTE — Assessment & Plan Note (Addendum)
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  Patient currently on Acalabrutnib [AUG 16th, 2023]-  Patient admits to compliance; JUNE 13th, 2024- [compared to CT JAN 2024] -Increased adenopathy above and below the diaphragm compatible with progressive lymphomatous disease.  No splenomegaly. However, patient had interruption of his  acal just prior to his CT scan.  Clinically noted to have improvement of his neck lymph nodes.  Continue Acalbrutinib 100 mg BID.   #  For now- will repeat imaging in 3-4 months; will order imaging at next visit.   # Neck pain/Joint pains/ Muscle pain- s/p evluation with  Dr.Chasnis/ Dr.Brandon Katrinka Blazing; [facility]. Awaiting surgery end of the month. Continue tramadol/CBD prn; refilled-stable. Will recommending withholding acalabrutinib treatment for 7 days before and after surgery [a total of 2 weeks]. Discussed with Neurosurgery.   # Mild Anemia Hb ~10-11- NOV 2023- iron sat- 17%; ferritin-71 continue gentle iron/slow iron. Monitor for now. If  get worse- recommend bone marrow- stable.    # Low immunoglobulins IgG 330/recent severe respiratory infection/CLL-s/pI IVIG #4/4 [last April 2022].MARCH 2024-- IgG > 1500- stable.    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   DISPOSITION: # follow up in 2 month-  MD; labs- cbc/cmp; LDH; B12;iron studies; ferritin; retic count;  Dr.B

## 2023-02-25 NOTE — Progress Notes (Signed)
No concerns today 

## 2023-03-02 ENCOUNTER — Other Ambulatory Visit (HOSPITAL_COMMUNITY): Payer: Self-pay

## 2023-03-03 ENCOUNTER — Telehealth: Payer: Self-pay

## 2023-03-03 DIAGNOSIS — M4802 Spinal stenosis, cervical region: Secondary | ICD-10-CM

## 2023-03-03 DIAGNOSIS — G959 Disease of spinal cord, unspecified: Secondary | ICD-10-CM

## 2023-03-04 ENCOUNTER — Encounter: Payer: Self-pay | Admitting: Internal Medicine

## 2023-03-04 NOTE — Telephone Encounter (Signed)
I spoke with Mr Seat. We discussed the following:   Planned surgery: C3-T1 laminoplasty   Surgery date: 03/26/23 at [facility]: [address] [place], [place] [address]) - you will find out your arrival time the business day before your surgery.   Pre-op appointment at [facility] Pre-admit Testing: we will call you with a date/time for this. If you are scheduled for an in person appointment, Pre-admit Testing is located on the first floor of the [facility] building, [address], [address]. Please bring all prescriptions in the original prescription bottles to your appointment, even if you have reviewed medications by phone with a pharmacy representative. During this appointment, they will advise you which medications you can take the morning of surgery, and which medications you will need to hold for surgery. Pre-op labs may be done at your pre-op appointment. You are not required to fast for these labs. Should you need to change your pre-op appointment, please call Pre-admit testing at [phone].      Blood thinners:      Apixaban (Eliquis):   stop Eliquis 72 hours prior, resume Eliquis 14 days after   Chronic lymphocytic leukemia:  Acalabrutinib maleate (calquence): hold for 1 week before and 1 week after      Surgical clearance: we will send a clearance form to Dr Mariah Milling (cardiology) and Dr Donneta Romberg (oncology)     Common restrictions after surgery: No bending, lifting, or twisting ("BLT"). Avoid lifting objects heavier than 10 pounds for the first 6 weeks after surgery. Where possible, avoid household activities that involve lifting, bending, reaching, pushing, or pulling such as laundry, vacuuming, grocery shopping, and childcare. Try to arrange for help from friends and family for these activities while you heal. Do not drive while taking prescription pain medication. Weeks 6 through 12 after surgery: avoid lifting  more than 25 pounds.     How to contact us:  If you have any questions/concerns before or after surgery, you can reach [place] at [phone], or you can send a mychart message. We can be reached by phone or mychart 8am-4pm, Monday-Friday.  *Please note: Calls after 4pm are forwarded to a third party answering service. Mychart messages are not routinely monitored during evenings, weekends, and holidays. Please call our office to contact the answering service for urgent concerns during non-business hours.    If you have FMLA/disability paperwork, please drop it off or fax it to [phone], attention Patty.   Appointments/FMLA & disability paperwork: Patty & Cristin  Nurse: Royston Cowper  Medical assistants: Laurann Montana, & Lyla Son Physician Assistants: Manning Charity & Drake Leach Surgeons: Venetia Night, MD & Ernestine Mcmurray, MD

## 2023-03-05 ENCOUNTER — Other Ambulatory Visit: Payer: Self-pay

## 2023-03-05 ENCOUNTER — Encounter (INDEPENDENT_AMBULATORY_CARE_PROVIDER_SITE_OTHER): Payer: Self-pay

## 2023-03-05 ENCOUNTER — Telehealth: Payer: Self-pay | Admitting: Cardiovascular Disease

## 2023-03-05 DIAGNOSIS — Z01818 Encounter for other preprocedural examination: Secondary | ICD-10-CM

## 2023-03-05 NOTE — Telephone Encounter (Signed)
   Pre-operative Risk Assessment    Patient Name: [name]  DOB: 1946-07-21 MRN: [id number]      Request for Surgical Clearance    Procedure:   C3-T1 LAMINOPLASTY  Date of Surgery:  Clearance 03/26/23                               Surgeon:  DR Ernestine Mcmurray Surgeon's Group or Practice Name:  [facility] Phone number:  [phone] Fax number:  [phone]  Type of Clearance Requested:   - Pharmacy:  Hold Apixaban (Eliquis) ELIQUIS 72 HOURS PRIOR, CALQUENCE 1 WEEK PRIOR ELIQUIS 14 DAYS AFTER, CALQUENCE 1 WEEK AFTER  Type of Anesthesia:  General    Additional requests/questions:    Signed, Dalia Heading   03/05/2023, 12:05 PM

## 2023-03-06 NOTE — Telephone Encounter (Signed)
Patient with diagnosis of atrial fibrillation on Eliquis for anticoagulation.    Procedure:   C3-T1 LAMINOPLASTY   Date of Surgery:  Clearance 03/26/23   CHA2DS2-VASc Score = 6   This indicates a 9.7% annual risk of stroke. The patient's score is based upon: CHF History: 0 HTN History: 1 Diabetes History: 0 Stroke History: 2 Vascular Disease History: 1 Age Score: 2 Gender Score: 0   Per chart, stroke was 09/2021  CrCl 71 Platelet count 135  Per office protocol, patient can hold Eliquis for 3 days prior to procedure.   Patient will not need bridging with Lovenox (enoxaparin) around procedure.  WILL NEED TO REVIEW WITH PRIMARY CARDIOLOGIST FOR REQUESTED 14 DAY HOLD OF ELIQUIS AFTER PROCEDURE  **This guidance is not considered finalized until pre-operative APP has relayed final recommendations.**

## 2023-03-09 ENCOUNTER — Telehealth: Payer: Self-pay | Admitting: Family Medicine

## 2023-03-09 NOTE — Telephone Encounter (Signed)
Patient just called and said he tested positive for covid. He wants to know what can he do. He doesn't feel good. His number is [phone].

## 2023-03-10 ENCOUNTER — Telehealth (INDEPENDENT_AMBULATORY_CARE_PROVIDER_SITE_OTHER): Payer: Medicare HMO | Admitting: Nurse Practitioner

## 2023-03-10 ENCOUNTER — Encounter: Payer: Self-pay | Admitting: Nurse Practitioner

## 2023-03-10 VITALS — BP 130/70 | Ht 68.5 in | Wt 203.0 lb

## 2023-03-10 DIAGNOSIS — J988 Other specified respiratory disorders: Secondary | ICD-10-CM | POA: Diagnosis not present

## 2023-03-10 DIAGNOSIS — J310 Chronic rhinitis: Secondary | ICD-10-CM | POA: Diagnosis not present

## 2023-03-10 DIAGNOSIS — U071 COVID-19: Secondary | ICD-10-CM

## 2023-03-10 HISTORY — DX: COVID-19: U07.1

## 2023-03-10 MED ORDER — DOXYCYCLINE HYCLATE 100 MG PO TABS: 100.0000 mg | ORAL_TABLET | Freq: Two times a day (BID) | ORAL | 0 refills | Status: DC

## 2023-03-10 MED ORDER — PREDNISONE 20 MG PO TABS: 40.0000 mg | ORAL_TABLET | Freq: Every day | ORAL | 0 refills | Status: DC

## 2023-03-10 MED ORDER — AZELASTINE HCL 0.1 % NA SOLN
2.0000 | Freq: Two times a day (BID) | NASAL | 1 refills | Status: AC
Start: 2023-03-10 — End: ?

## 2023-03-10 MED ORDER — BENZONATATE 200 MG PO CAPS
200.0000 mg | ORAL_CAPSULE | Freq: Three times a day (TID) | ORAL | 0 refills | Status: DC | PRN
Start: 2023-03-10 — End: 2023-04-03

## 2023-03-10 NOTE — Progress Notes (Signed)
MyChart Video Visit    Virtual Visit via Video Note   This visit type was conducted because this format is felt to be most appropriate for this patient at this time. Physical exam was limited by quality of the video and audio technology used for the visit. CMA was able to get the patient set up on a video visit.  Patient location: Home. Patient and provider in visit Provider location: Office  I discussed the limitations of evaluation and management by telemedicine and the availability of in person appointments. The patient expressed understanding and agreed to proceed.  Visit Date: 03/10/2023  Today's healthcare provider: Bethanie Dicker, NP     Subjective:    Patient ID: [name], male    DOB: 09/14/1945, 77 y.o.   MRN: [id number]  Chief Complaint  Patient presents with   Covid Positive    Tested positive Saturday symptoms started Thursday  stuffy nose, trouble breathing, coughing     HPI Patient on day 6 of symptoms, started on Thursday 03/05/2023. He tested positive for COVID at home on 03/07/2023.  Respiratory illness:  Cough- Yes, productive  Congestion-    Sinus- Yes, nasal   Chest- No  Post nasal drip- Yes  Sore throat- Yes  Shortness of breath- Yes  Fever- No  Fatigue/Myalgia- Yes Headache- No Nausea/Vomiting- No Taste disturbance- Yes  Smell disturbance- Yes  Covid exposure- Yes, wife   Covid vaccination- x 6  Flu vaccination- UTD  Medications- Nasal sprays, inhalers, Tylenol   Past Medical History:  Diagnosis Date   Anxiety    Arthritis    Atrial fibrillation (HCC)    a. Dx 2013, recurred 02/2014, CHA2DS2VASc = 3 -->placed on Eliquis;  b. 02/2014 Echo: EF 50-55%, mid and apical anterior septum and mid and apical inf septum are abnl, mild to mod Ao sclerosis w/o AS.   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 01/2021 LV Gram: EF 35-45%; b. 02/2021 Echo: EF 55-60%, no rwma,  GrI DD, mildly reduced RV fxn, triv MR, mild-mod AoV sclerosis w/o stenosis.   Chronic lymphocytic leukemia (HCC)    a. Dx 02/2014.   Complication of anesthesia    History of  PTSD--do not touch patient when waking up from surgery.   COPD (chronic obstructive pulmonary disease) (HCC)    Coronary artery disease    a. 04/2009 CABG x 3 (LIMA->LAD, VG->OM1, VG->PDA);  b. 09/2009 Cath: occluded VG x 2 w/ patent LIMA and L->R collats. EF 55%, mild antlat HK;  c. 01/2021 Cath: LM mild diff dzs, LAD 100p, LCX nl, RCA 50p, 95d, 99 side branch, RPAV fills via LCX. VG->RPDA 100, VG->OM1 100, LIMA->LAD nl. EF 35-45%.   GERD (gastroesophageal reflux disease)    occasional   History of chemotherapy 2015-2016   HOH (hard of hearing)    Bilateral Hearing Aids   Hyperlipidemia LDL goal <70    Hypertension    Ischemic cardiomyopathy    a. 01/2021 LV Gram: EF 35-45%; b. 02/2021 Echo: EF 55-60%.   Myocardial infarction (HCC) 2010   OSA on CPAP    USE C-PAP   PTSD (post-traumatic stress disorder)    Rheumatic fever 1959   SSS (sick sinus syndrome) (HCC)    a. 02/2016 s/p MDT MRI compatible DC PPM (ser# [id number]).   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   Stroke (HCC)  TIA (transient ischemic attack) 11/02/2015    Past Surgical History:  Procedure Laterality Date   ABDOMINAL HERNIA REPAIR     APPENDECTOMY  06/21/1985   CARDIAC CATHETERIZATION  2010; 2011   ; Dr Kirke Corin   CORONARY ARTERY BYPASS GRAFT  04/2009   "CABG X3"   EP IMPLANTABLE DEVICE N/A 03/03/2016   Procedure: Pacemaker Implant;  Surgeon: Duke Salvia, MD;  Location: [facility] INVASIVE CV LAB;  Service: Cardiovascular;  Laterality: N/A;   FOREIGN BODY REMOVAL  1968   "shrapnel in my tailbone"   INGUINAL HERNIA REPAIR Right    INSERT / REPLACE / REMOVE PACEMAKER     JOINT REPLACEMENT Right 2018   LAPAROSCOPIC CHOLECYSTECTOMY     RIGHT/LEFT HEART CATH AND CORONARY/GRAFT ANGIOGRAPHY N/A 02/04/2021   Procedure: RIGHT/LEFT HEART  CATH AND CORONARY/GRAFT ANGIOGRAPHY;  Surgeon: Yvonne Kendall, MD;  Location: [facility] INVASIVE CV LAB;  Service: Cardiovascular;  Laterality: N/A;   TONSILLECTOMY AND ADENOIDECTOMY  1956   TOTAL HIP ARTHROPLASTY Right 10/22/2016   Procedure: TOTAL HIP ARTHROPLASTY;  Surgeon: Donato Heinz, MD;  Location: [facility] ORS;  Service: Orthopedics;  Laterality: Right;   TOTAL HIP ARTHROPLASTY Left 11/04/2017   Procedure: TOTAL HIP ARTHROPLASTY;  Surgeon: Donato Heinz, MD;  Location: [facility] ORS;  Service: Orthopedics;  Laterality: Left;    Family History  Problem Relation Age of Onset   Heart disease Mother    Heart attack Mother    Coronary artery disease Other        family history    Social History   Socioeconomic History   Marital status: Married    Spouse name: Not on file   Number of children: Not on file   Years of education: Not on file   Highest education level: Not on file  Occupational History   Occupation: retired    Associate Professor: [employer]    Comment: [employer]  Tobacco Use   Smoking status: Former    Current packs/day: 0.00    Average packs/day: 1 pack/day for 40.0 years (40.0 ttl pk-yrs)    Types: Cigarettes    Start date: 07/21/1966    Quit date: 07/21/2006    Years since quitting: 16.6   Smokeless tobacco: Never  Vaping Use   Vaping status: Former  Substance and Sexual Activity   Alcohol use: Not Currently   Drug use: No   Sexual activity: Not Currently  Other Topics Concern   Not on file  Social History Narrative   Lives in [place] with wife. Dogs. Goats. Children - 4. Grandchildren - 7.      Worked - Software engineer, part time.   Diet - healthy   Exercise - very active      Service - [employer] - [place]   Social Determinants of Health   Financial Resource Strain: Low Risk  (09/12/2022)   Overall Financial Resource Strain (CARDIA)    Difficulty of Paying Living Expenses: Not hard at all  Food Insecurity: No Food Insecurity (09/12/2022)    Hunger Vital Sign    Worried About Running Out of Food in the Last Year: Never true    Ran Out of Food in the Last Year: Never true  Transportation Needs: No Transportation Needs (09/12/2022)   PRAPARE - Administrator, Civil Service (Medical): No    Lack of Transportation (Non-Medical): No  Physical Activity: Unknown (09/12/2022)   Exercise Vital Sign    Days of Exercise per Week: 5 days  Minutes of Exercise per Session: Not on file  Stress: No Stress Concern Present (09/12/2022)   [employer] of Occupational Health - Occupational Stress Questionnaire    Feeling of Stress : Not at all  Social Connections: Unknown (09/12/2022)   Social Connection and Isolation Panel [NHANES]    Frequency of Communication with Friends and Family: More than three times a week    Frequency of Social Gatherings with Friends and Family: More than three times a week    Attends Religious Services: Not on file    Active Member of Clubs or Organizations: Not on file    Attends Banker Meetings: Not on file    Marital Status: Married  Intimate Partner Violence: Not At Risk (09/12/2022)   Humiliation, Afraid, Rape, and Kick questionnaire    Fear of Current or Ex-Partner: No    Emotionally Abused: No    Physically Abused: No    Sexually Abused: No    Outpatient Medications Prior to Visit  Medication Sig Dispense Refill   acalabrutinib maleate (CALQUENCE) 100 MG tablet Take 1 tablet (100 mg total) by mouth 2 (two) times daily. 60 tablet 2   acetaminophen (TYLENOL) 325 MG tablet Take 1-2 tablets (325-650 mg total) by mouth every 4 (four) hours as needed for mild pain.     acyclovir (ZOVIRAX) 400 MG tablet TAKE 1 TABLET(400 MG) BY MOUTH TWICE DAILY 60 tablet 6   albuterol (VENTOLIN HFA) 108 (90 Base) MCG/ACT inhaler Inhale 2 puffs into the lungs every 6 (six) hours as needed for wheezing or shortness of breath. 1 each 0   amiodarone (PACERONE) 200 MG tablet Take 1/2 tablet (100 mg) by  mouth twice daily 5 days a week 60 tablet 6   apixaban (ELIQUIS) 5 MG TABS tablet Take 5 mg by mouth 2 (two) times daily.     budesonide-formoterol (SYMBICORT) 160-4.5 MCG/ACT inhaler Inhale 2 puffs into the lungs 2 (two) times daily. 1 Inhaler 11   cetirizine (ZYRTEC) 10 MG tablet Take 10 mg by mouth daily as needed for allergies.      Coenzyme Q10 (COQ10) 200 MG CAPS Take 200 mg by mouth daily.     diphenoxylate-atropine (LOMOTIL) 2.5-0.025 MG tablet Take by mouth 4 (four) times daily as needed for diarrhea or loose stools.     Evolocumab (REPATHA SURECLICK) 140 MG/ML SOAJ Inject 140 mg into the skin every 14 (fourteen) days. 2 mL 11   ezetimibe (ZETIA) 10 MG tablet Take 1 tablet (10 mg total) by mouth daily. 90 tablet 3   ferrous sulfate 325 (65 FE) MG EC tablet Take 325 mg by mouth 3 (three) times daily with meals.     furosemide (LASIX) 20 MG tablet Take 10 mg by mouth daily. (Patient not taking: Reported on 02/24/2023)     gabapentin (NEURONTIN) 300 MG capsule Take by mouth.     Krill Oil 350 MG CAPS Take 350 mg by mouth daily as needed (Heart).     mirtazapine (REMERON) 15 MG tablet Take 15 mg by mouth at bedtime as needed (for panic associated with PTSD).      Multiple Vitamin (MULTIVITAMIN WITH MINERALS) TABS tablet Take 1 tablet by mouth daily.     nitroGLYCERIN (NITROSTAT) 0.4 MG SL tablet Place 1 tablet (0.4 mg total) under the tongue every 5 (five) minutes as needed for chest pain. 25 tablet 6   ondansetron (ZOFRAN-ODT) 8 MG disintegrating tablet Take 1 tablet (8 mg total) by mouth every 8 (eight) hours  as needed for nausea or vomiting. 20 tablet 0   pantoprazole (PROTONIX) 40 MG tablet Take 1 tablet (40 mg total) by mouth daily. 30 tablet 2   tamsulosin (FLOMAX) 0.4 MG CAPS capsule Take 1 capsule (0.4 mg total) by mouth daily. 30 capsule 3   Tiotropium Bromide Monohydrate 2.5 MCG/ACT AERS Inhale 2 puffs into the lungs at bedtime. Spiriva     traMADol (ULTRAM) 50 MG tablet Take 1 tablet  (50 mg total) by mouth every 12 (twelve) hours as needed. 60 tablet 0   azelastine (ASTELIN) 0.1 % nasal spray Place 2 sprays into both nostrils 2 (two) times daily. Use in each nostril as directed 30 mL 1   benzonatate (TESSALON) 100 MG capsule Take 1 capsule (100 mg total) by mouth 3 (three) times daily as needed for cough. 60 capsule 0   No facility-administered medications prior to visit.    Allergies  Allergen Reactions   Atorvastatin Other (See Comments)    Muscle aching   Augmentin [Amoxicillin-Pot Clavulanate]     C diff    ROS See HPI     Objective:    Physical Exam  BP 130/70 Comment: pt reported  Ht 5' 8.5" (1.74 m)   Wt 203 lb (92.1 kg)   BMI 30.42 kg/m  Wt Readings from Last 3 Encounters:  03/10/23 203 lb (92.1 kg)  02/25/23 203 lb (92.1 kg)  02/24/23 203 lb (92.1 kg)   GENERAL: alert, oriented, appears well and in no acute distress   HEENT: atraumatic, conjunttiva clear, no obvious abnormalities on inspection of external nose and ears   NECK: normal movements of the head and neck   LUNGS: on inspection no signs of respiratory distress, breathing rate appears normal, no obvious gross SOB, gasping or wheezing   CV: no obvious cyanosis   MS: moves all visible extremities without noticeable abnormality   PSYCH/NEURO: pleasant and cooperative, no obvious depression or anxiety, speech and thought processing grossly intact     Assessment & Plan:   Problem List Items Addressed This Visit       Respiratory   Respiratory tract infection due to COVID-19 virus - Primary    Symptoms x 6 days. Will treat with Doxy 100 mg BID and Prednisone. Benzonatate sent for cough. Encouraged adequate fluid intake. Advised to continue using inhalers daily. He can continue nasal spray and Tylenol as needed. Strict precautions given to patient, if worsening shortness of breath or trouble breathing he will seek immediate medical attention.       Relevant Medications    predniSONE (DELTASONE) 20 MG tablet   doxycycline (VIBRA-TABS) 100 MG tablet   benzonatate (TESSALON) 200 MG capsule   Other Visit Diagnoses     Chronic rhinitis       Relevant Medications   azelastine (ASTELIN) 0.1 % nasal spray       I have discontinued Rande Brunt. Galster "Tom"'s benzonatate. I am also having him start on predniSONE, doxycycline, and benzonatate. Additionally, I am having him maintain his multivitamin with minerals, cetirizine, Krill Oil, mirtazapine, budesonide-formoterol, ezetimibe, nitroGLYCERIN, CoQ10, apixaban, Tiotropium Bromide Monohydrate, acyclovir, acetaminophen, albuterol, amiodarone, gabapentin, ferrous sulfate, diphenoxylate-atropine, furosemide, Repatha SureClick, ondansetron, tamsulosin, pantoprazole, Calquence, traMADol, and azelastine.  Meds ordered this encounter  Medications   predniSONE (DELTASONE) 20 MG tablet    Sig: Take 2 tablets (40 mg total) by mouth daily.    Dispense:  10 tablet    Refill:  0    Order Specific Question:  Supervising Provider    Answer:   Birdie Sons, ERIC G [[id number]]   doxycycline (VIBRA-TABS) 100 MG tablet    Sig: Take 1 tablet (100 mg total) by mouth 2 (two) times daily.    Dispense:  14 tablet    Refill:  0    Order Specific Question:   Supervising Provider    Answer:   Birdie Sons, ERIC G [[id number]]   benzonatate (TESSALON) 200 MG capsule    Sig: Take 1 capsule (200 mg total) by mouth 3 (three) times daily as needed for cough.    Dispense:  30 capsule    Refill:  0    Order Specific Question:   Supervising Provider    Answer:   Birdie Sons, ERIC G [[id number]]   azelastine (ASTELIN) 0.1 % nasal spray    Sig: Place 2 sprays into both nostrils 2 (two) times daily. Use in each nostril as directed    Dispense:  30 mL    Refill:  1    Order Specific Question:   Supervising Provider    Answer:   Birdie Sons, ERIC G [[id number]]    I discussed the assessment and treatment plan with the patient. The patient was provided an opportunity to  ask questions and all were answered. The patient agreed with the plan and demonstrated an understanding of the instructions.   The patient was advised to call back or seek an in-person evaluation if the symptoms worsen or if the condition fails to improve as anticipated.   Bethanie Dicker, NP [facility] [phone] (phone) [phone] (fax)  [facility]

## 2023-03-10 NOTE — Assessment & Plan Note (Signed)
Symptoms x 6 days. Will treat with Doxy 100 mg BID and Prednisone. Benzonatate sent for cough. Encouraged adequate fluid intake. Advised to continue using inhalers daily. He can continue nasal spray and Tylenol as needed. Strict precautions given to patient, if worsening shortness of breath or trouble breathing he will seek immediate medical attention.

## 2023-03-11 ENCOUNTER — Telehealth: Payer: Self-pay

## 2023-03-11 NOTE — Telephone Encounter (Signed)
I spoke with [name] and relayed to him that we will need to postpone his surgery to 04/21/23 due to his recent covid diagnosis. He verbalized understanding of this. I reminded him about stopping his eliquis and calquence for prior to his new surgery date. His post op appointments have been rescheduled accordingly.

## 2023-03-11 NOTE — Telephone Encounter (Signed)
-----   Message from Lovenia Kim sent at 03/11/2023 11:43 AM EDT ----- Regarding: RE: covid + 03/09/23 Should probably postpone, he's had this for a while ----- Message ----- From: Sharlot Gowda, RN Sent: 03/10/2023   6:54 PM EDT To: Lovenia Kim, MD; Sharlot Gowda, RN Subject: covid + 03/09/23                                I saw in his chart that he tested positive for covid on 03/09/23. Can he stay on OR schedule for 03/26/23? Or will he have to postpone 6 weeks?

## 2023-03-12 ENCOUNTER — Other Ambulatory Visit: Payer: Medicare HMO

## 2023-03-12 NOTE — Telephone Encounter (Signed)
Saw NP on 02/2023.

## 2023-03-13 NOTE — Telephone Encounter (Signed)
   Patient Name: [name]  DOB: 1945-11-25 MRN: [id number]  Primary Cardiologist: Julien Nordmann, MD  Chart reviewed as part of pre-operative protocol coverage.   Per Dr. Mariah Milling Acceptable risk for procedure Acceptable risk to hold  Eliquis 3 days prior to neck surgery, and extended hold following surgery Would recommend close monitoring of heart rate post procedure Continue on amiodarone 100 twice daily Thx TGollan   Napoleon Form, Leodis Rains, NP 03/13/2023, 3:02 PM

## 2023-03-22 DIAGNOSIS — A045 Campylobacter enteritis: Secondary | ICD-10-CM

## 2023-03-22 HISTORY — DX: Campylobacter enteritis: A04.5

## 2023-03-25 ENCOUNTER — Telehealth: Payer: Self-pay | Admitting: Internal Medicine

## 2023-03-25 ENCOUNTER — Other Ambulatory Visit (HOSPITAL_COMMUNITY): Payer: Self-pay

## 2023-03-25 NOTE — Telephone Encounter (Signed)
Patient would like to discuss wrong CPAP mask. Patient is in the office now. Patient phone number is [phone].

## 2023-03-25 NOTE — Telephone Encounter (Signed)
Spoke to patient while in office. I have provided him with [employer]'s contact number.  Nothing further needed.

## 2023-03-27 ENCOUNTER — Other Ambulatory Visit (HOSPITAL_COMMUNITY): Payer: Self-pay

## 2023-03-30 ENCOUNTER — Encounter (HOSPITAL_COMMUNITY): Payer: Self-pay

## 2023-03-30 ENCOUNTER — Other Ambulatory Visit (HOSPITAL_COMMUNITY): Payer: Self-pay

## 2023-03-31 ENCOUNTER — Other Ambulatory Visit: Payer: Self-pay

## 2023-03-31 ENCOUNTER — Other Ambulatory Visit: Payer: Self-pay | Admitting: Lab

## 2023-03-31 ENCOUNTER — Telehealth: Payer: Self-pay | Admitting: *Deleted

## 2023-03-31 ENCOUNTER — Inpatient Hospital Stay: Payer: Medicare HMO | Attending: Internal Medicine

## 2023-03-31 ENCOUNTER — Inpatient Hospital Stay
Admission: AD | Admit: 2023-03-31 | Discharge: 2023-04-03 | DRG: 372 | Disposition: A | Discharge status: Home / Self Care | Payer: Medicare HMO | Source: Ambulatory Visit | Attending: Internal Medicine | Admitting: Internal Medicine

## 2023-03-31 ENCOUNTER — Inpatient Hospital Stay (HOSPITAL_BASED_OUTPATIENT_CLINIC_OR_DEPARTMENT_OTHER): Payer: Medicare HMO | Admitting: Hospice and Palliative Medicine

## 2023-03-31 ENCOUNTER — Inpatient Hospital Stay: Payer: Medicare HMO

## 2023-03-31 ENCOUNTER — Encounter (HOSPITAL_COMMUNITY): Payer: Self-pay

## 2023-03-31 ENCOUNTER — Other Ambulatory Visit: Payer: Self-pay | Admitting: *Deleted

## 2023-03-31 ENCOUNTER — Encounter: Payer: Self-pay | Admitting: Internal Medicine

## 2023-03-31 VITALS — BP 86/44 | HR 62 | Temp 97.6°F | Resp 18 | Ht 68.5 in | Wt 200.0 lb

## 2023-03-31 VITALS — BP 113/56 | HR 63 | Temp 98.6°F | Resp 20

## 2023-03-31 DIAGNOSIS — Z8616 Personal history of COVID-19: Secondary | ICD-10-CM

## 2023-03-31 DIAGNOSIS — E6609 Other obesity due to excess calories: Secondary | ICD-10-CM | POA: Diagnosis present

## 2023-03-31 DIAGNOSIS — Z881 Allergy status to other antibiotic agents status: Secondary | ICD-10-CM

## 2023-03-31 DIAGNOSIS — C9110 Chronic lymphocytic leukemia of B-cell type not having achieved remission: Secondary | ICD-10-CM

## 2023-03-31 DIAGNOSIS — Z888 Allergy status to other drugs, medicaments and biological substances status: Secondary | ICD-10-CM

## 2023-03-31 DIAGNOSIS — Z7952 Long term (current) use of systemic steroids: Secondary | ICD-10-CM

## 2023-03-31 DIAGNOSIS — E876 Hypokalemia: Secondary | ICD-10-CM | POA: Insufficient documentation

## 2023-03-31 DIAGNOSIS — Z7969 Long term (current) use of other immunomodulators and immunosuppressants: Secondary | ICD-10-CM

## 2023-03-31 DIAGNOSIS — J432 Centrilobular emphysema: Secondary | ICD-10-CM

## 2023-03-31 DIAGNOSIS — R197 Diarrhea, unspecified: Secondary | ICD-10-CM

## 2023-03-31 DIAGNOSIS — E785 Hyperlipidemia, unspecified: Secondary | ICD-10-CM | POA: Diagnosis present

## 2023-03-31 DIAGNOSIS — I25118 Atherosclerotic heart disease of native coronary artery with other forms of angina pectoris: Secondary | ICD-10-CM | POA: Diagnosis not present

## 2023-03-31 DIAGNOSIS — E860 Dehydration: Secondary | ICD-10-CM

## 2023-03-31 DIAGNOSIS — C9112 Chronic lymphocytic leukemia of B-cell type in relapse: Secondary | ICD-10-CM | POA: Insufficient documentation

## 2023-03-31 DIAGNOSIS — H9193 Unspecified hearing loss, bilateral: Secondary | ICD-10-CM | POA: Diagnosis present

## 2023-03-31 DIAGNOSIS — Z9221 Personal history of antineoplastic chemotherapy: Secondary | ICD-10-CM

## 2023-03-31 DIAGNOSIS — A045 Campylobacter enteritis: Principal | ICD-10-CM | POA: Diagnosis present

## 2023-03-31 DIAGNOSIS — Z7901 Long term (current) use of anticoagulants: Secondary | ICD-10-CM

## 2023-03-31 DIAGNOSIS — I10 Essential (primary) hypertension: Secondary | ICD-10-CM | POA: Diagnosis not present

## 2023-03-31 DIAGNOSIS — J441 Chronic obstructive pulmonary disease with (acute) exacerbation: Secondary | ICD-10-CM | POA: Diagnosis present

## 2023-03-31 DIAGNOSIS — R112 Nausea with vomiting, unspecified: Secondary | ICD-10-CM | POA: Diagnosis not present

## 2023-03-31 DIAGNOSIS — E861 Hypovolemia: Secondary | ICD-10-CM

## 2023-03-31 DIAGNOSIS — I959 Hypotension, unspecified: Secondary | ICD-10-CM | POA: Diagnosis present

## 2023-03-31 DIAGNOSIS — Z87891 Personal history of nicotine dependence: Secondary | ICD-10-CM

## 2023-03-31 DIAGNOSIS — Z7951 Long term (current) use of inhaled steroids: Secondary | ICD-10-CM

## 2023-03-31 DIAGNOSIS — Z8249 Family history of ischemic heart disease and other diseases of the circulatory system: Secondary | ICD-10-CM

## 2023-03-31 DIAGNOSIS — Z6830 Body mass index (BMI) 30.0-30.9, adult: Secondary | ICD-10-CM

## 2023-03-31 DIAGNOSIS — I255 Ischemic cardiomyopathy: Secondary | ICD-10-CM | POA: Diagnosis present

## 2023-03-31 DIAGNOSIS — Z951 Presence of aortocoronary bypass graft: Secondary | ICD-10-CM

## 2023-03-31 DIAGNOSIS — I2510 Atherosclerotic heart disease of native coronary artery without angina pectoris: Secondary | ICD-10-CM | POA: Diagnosis present

## 2023-03-31 DIAGNOSIS — Z9049 Acquired absence of other specified parts of digestive tract: Secondary | ICD-10-CM

## 2023-03-31 DIAGNOSIS — E66811 Obesity, class 1: Secondary | ICD-10-CM

## 2023-03-31 DIAGNOSIS — I495 Sick sinus syndrome: Secondary | ICD-10-CM | POA: Diagnosis present

## 2023-03-31 DIAGNOSIS — Z974 Presence of external hearing-aid: Secondary | ICD-10-CM

## 2023-03-31 DIAGNOSIS — J449 Chronic obstructive pulmonary disease, unspecified: Secondary | ICD-10-CM | POA: Diagnosis present

## 2023-03-31 DIAGNOSIS — Z79899 Other long term (current) drug therapy: Secondary | ICD-10-CM

## 2023-03-31 DIAGNOSIS — K219 Gastro-esophageal reflux disease without esophagitis: Secondary | ICD-10-CM | POA: Diagnosis present

## 2023-03-31 DIAGNOSIS — Z713 Dietary counseling and surveillance: Secondary | ICD-10-CM

## 2023-03-31 DIAGNOSIS — Z950 Presence of cardiac pacemaker: Secondary | ICD-10-CM

## 2023-03-31 DIAGNOSIS — I5032 Chronic diastolic (congestive) heart failure: Secondary | ICD-10-CM

## 2023-03-31 DIAGNOSIS — G4733 Obstructive sleep apnea (adult) (pediatric): Secondary | ICD-10-CM | POA: Diagnosis present

## 2023-03-31 DIAGNOSIS — I252 Old myocardial infarction: Secondary | ICD-10-CM

## 2023-03-31 DIAGNOSIS — I110 Hypertensive heart disease with heart failure: Secondary | ICD-10-CM | POA: Diagnosis present

## 2023-03-31 DIAGNOSIS — I480 Paroxysmal atrial fibrillation: Secondary | ICD-10-CM

## 2023-03-31 DIAGNOSIS — F4310 Post-traumatic stress disorder, unspecified: Secondary | ICD-10-CM | POA: Diagnosis present

## 2023-03-31 DIAGNOSIS — Z96643 Presence of artificial hip joint, bilateral: Secondary | ICD-10-CM | POA: Diagnosis present

## 2023-03-31 DIAGNOSIS — Z8673 Personal history of transient ischemic attack (TIA), and cerebral infarction without residual deficits: Secondary | ICD-10-CM

## 2023-03-31 LAB — CBC WITH DIFFERENTIAL (CANCER CENTER ONLY)
Abs Immature Granulocytes: 0.02 10*3/uL (ref 0.00–0.07)
Basophils Absolute: 0 10*3/uL (ref 0.0–0.1)
Basophils Relative: 0 %
Eosinophils Absolute: 0 10*3/uL (ref 0.0–0.5)
Eosinophils Relative: 1 %
HCT: 30.3 % — ABNORMAL LOW (ref 39.0–52.0)
Hemoglobin: 9.7 g/dL — ABNORMAL LOW (ref 13.0–17.0)
Immature Granulocytes: 0 %
Lymphocytes Relative: 50 %
Lymphs Abs: 3.4 10*3/uL (ref 0.7–4.0)
MCH: 33.4 pg (ref 26.0–34.0)
MCHC: 32 g/dL (ref 30.0–36.0)
MCV: 104.5 fL — ABNORMAL HIGH (ref 80.0–100.0)
Monocytes Absolute: 0.6 10*3/uL (ref 0.1–1.0)
Monocytes Relative: 9 %
Neutro Abs: 2.7 10*3/uL (ref 1.7–7.7)
Neutrophils Relative %: 40 %
Platelet Count: 156 10*3/uL (ref 150–400)
RBC: 2.9 MIL/uL — ABNORMAL LOW (ref 4.22–5.81)
RDW: 14.4 % (ref 11.5–15.5)
WBC Count: 6.8 10*3/uL (ref 4.0–10.5)
nRBC: 0 % (ref 0.0–0.2)

## 2023-03-31 LAB — CMP (CANCER CENTER ONLY)
ALT: 14 U/L (ref 0–44)
AST: 13 U/L — ABNORMAL LOW (ref 15–41)
Albumin: 3.4 g/dL — ABNORMAL LOW (ref 3.5–5.0)
Alkaline Phosphatase: 76 U/L (ref 38–126)
Anion gap: 8 (ref 5–15)
BUN: 16 mg/dL (ref 8–23)
CO2: 23 mmol/L (ref 22–32)
Calcium: 8.4 mg/dL — ABNORMAL LOW (ref 8.9–10.3)
Chloride: 107 mmol/L (ref 98–111)
Creatinine: 1.21 mg/dL (ref 0.61–1.24)
GFR, Estimated: >60 mL/min (ref >60)
Glucose, Bld: 117 mg/dL — ABNORMAL HIGH (ref 70–99)
Potassium: 2.9 mmol/L — ABNORMAL LOW (ref 3.5–5.1)
Sodium: 138 mmol/L (ref 135–145)
Total Bilirubin: 0.7 mg/dL (ref 0.3–1.2)
Total Protein: 6.5 g/dL (ref 6.5–8.1)

## 2023-03-31 LAB — GASTROINTESTINAL PANEL BY PCR, STOOL (REPLACES STOOL CULTURE)

## 2023-03-31 LAB — MAGNESIUM: Magnesium: 2.1 mg/dL (ref 1.7–2.4)

## 2023-03-31 LAB — C DIFFICILE QUICK SCREEN W PCR REFLEX
C Diff antigen: NEGATIVE
C Diff interpretation: NOT DETECTED
C Diff toxin: NEGATIVE

## 2023-03-31 MED ORDER — POTASSIUM CHLORIDE 10 MEQ/100ML IV SOLN
10.0000 meq | Freq: Once | INTRAVENOUS | Status: AC
  Administered 2023-03-31: 10 meq via INTRAVENOUS
  Filled 2023-03-31: qty 100

## 2023-03-31 MED ORDER — EZETIMIBE 10 MG PO TABS
10.0000 mg | ORAL_TABLET | Freq: Every day | ORAL | Status: DC
  Administered 2023-04-01 – 2023-04-03 (×3): 10 mg via ORAL
  Filled 2023-03-31 (×3): qty 1

## 2023-03-31 MED ORDER — ENOXAPARIN SODIUM 40 MG/0.4ML IJ SOSY: 40.0000 mg | PREFILLED_SYRINGE | INTRAMUSCULAR | Status: DC

## 2023-03-31 MED ORDER — TIOTROPIUM BROMIDE MONOHYDRATE 18 MCG IN CAPS
18.0000 ug | ORAL_CAPSULE | Freq: Every day | RESPIRATORY_TRACT | Status: DC
  Administered 2023-04-01 – 2023-04-02 (×2): 18 ug via RESPIRATORY_TRACT
  Filled 2023-03-31: qty 5

## 2023-03-31 MED ORDER — POLYETHYLENE GLYCOL 3350 17 G PO PACK: 17.0000 g | PACK | Freq: Every day | ORAL | Status: DC | PRN

## 2023-03-31 MED ORDER — MOMETASONE FURO-FORMOTEROL FUM 200-5 MCG/ACT IN AERO
2.0000 | INHALATION_SPRAY | Freq: Two times a day (BID) | RESPIRATORY_TRACT | Status: DC
  Administered 2023-04-01 – 2023-04-03 (×6): 2 via RESPIRATORY_TRACT
  Filled 2023-03-31: qty 8.8

## 2023-03-31 MED ORDER — SODIUM CHLORIDE 0.9% FLUSH
3.0000 mL | Freq: Two times a day (BID) | INTRAVENOUS | Status: DC
  Administered 2023-03-31 – 2023-04-03 (×5): 3 mL via INTRAVENOUS

## 2023-03-31 MED ORDER — ONDANSETRON HCL 4 MG/2ML IJ SOLN
4.0000 mg | Freq: Four times a day (QID) | INTRAMUSCULAR | Status: DC | PRN
  Administered 2023-04-01 – 2023-04-02 (×3): 4 mg via INTRAVENOUS
  Filled 2023-03-31 (×3): qty 2

## 2023-03-31 MED ORDER — POTASSIUM CHLORIDE 10 MEQ/100ML IV SOLN
10.0000 meq | Freq: Once | INTRAVENOUS | Status: AC
  Filled 2023-03-31: qty 100

## 2023-03-31 MED ORDER — ACETAMINOPHEN 650 MG RE SUPP: 650.0000 mg | Freq: Four times a day (QID) | RECTAL | Status: DC | PRN

## 2023-03-31 MED ORDER — POTASSIUM CHLORIDE CRYS ER 20 MEQ PO TBCR
40.0000 meq | EXTENDED_RELEASE_TABLET | Freq: Once | ORAL | Status: AC
  Administered 2023-03-31: 40 meq via ORAL
  Filled 2023-03-31: qty 2

## 2023-03-31 MED ORDER — SODIUM CHLORIDE 0.9 % IV SOLN: 20.0000 meq | Freq: Once | INTRAVENOUS | Status: DC

## 2023-03-31 MED ORDER — AMIODARONE HCL 200 MG PO TABS
100.0000 mg | ORAL_TABLET | Freq: Two times a day (BID) | ORAL | Status: DC
  Administered 2023-03-31 – 2023-04-03 (×6): 100 mg via ORAL
  Filled 2023-03-31 (×6): qty 1

## 2023-03-31 MED ORDER — IPRATROPIUM-ALBUTEROL 0.5-2.5 (3) MG/3ML IN SOLN: 3.0000 mL | Freq: Four times a day (QID) | RESPIRATORY_TRACT | Status: DC | PRN

## 2023-03-31 MED ORDER — APIXABAN 5 MG PO TABS
5.0000 mg | ORAL_TABLET | Freq: Two times a day (BID) | ORAL | Status: DC
  Administered 2023-04-01 – 2023-04-03 (×6): 5 mg via ORAL
  Filled 2023-03-31 (×6): qty 1

## 2023-03-31 MED ORDER — TAMSULOSIN HCL 0.4 MG PO CAPS
0.4000 mg | ORAL_CAPSULE | Freq: Every day | ORAL | Status: DC
  Administered 2023-04-01 – 2023-04-03 (×3): 0.4 mg via ORAL
  Filled 2023-03-31 (×3): qty 1

## 2023-03-31 MED ORDER — FERROUS SULFATE 325 (65 FE) MG PO TABS
325.0000 mg | ORAL_TABLET | Freq: Every day | ORAL | Status: DC
  Administered 2023-04-01 – 2023-04-03 (×3): 325 mg via ORAL
  Filled 2023-03-31 (×3): qty 1

## 2023-03-31 MED ORDER — ACETAMINOPHEN 325 MG PO TABS: 650.0000 mg | ORAL_TABLET | Freq: Four times a day (QID) | ORAL | Status: DC | PRN

## 2023-03-31 MED ORDER — AZITHROMYCIN 500 MG PO TABS
500.0000 mg | ORAL_TABLET | Freq: Every day | ORAL | Status: DC
  Administered 2023-03-31 – 2023-04-03 (×4): 500 mg via ORAL
  Filled 2023-03-31 (×4): qty 1

## 2023-03-31 MED ORDER — LACTATED RINGERS IV SOLN: INTRAVENOUS | Status: DC

## 2023-03-31 MED ORDER — LACTATED RINGERS IV BOLUS
1000.0000 mL | Freq: Once | INTRAVENOUS | Status: AC
  Administered 2023-03-31: 1000 mL via INTRAVENOUS

## 2023-03-31 MED ORDER — SODIUM CHLORIDE 0.9 % IV SOLN
INTRAVENOUS | Status: DC
  Filled 2023-03-31 (×2): qty 250

## 2023-03-31 MED ORDER — ONDANSETRON HCL 4 MG/2ML IJ SOLN
8.0000 mg | Freq: Once | INTRAMUSCULAR | Status: AC
  Administered 2023-03-31: 8 mg via INTRAVENOUS
  Filled 2023-03-31: qty 4

## 2023-03-31 MED ORDER — POTASSIUM CHLORIDE 10 MEQ/100ML IV SOLN
10.0000 meq | INTRAVENOUS | Status: AC
  Administered 2023-03-31 – 2023-04-01 (×2): 10 meq via INTRAVENOUS
  Filled 2023-03-31 (×2): qty 100

## 2023-03-31 MED ORDER — ONDANSETRON HCL 4 MG PO TABS
4.0000 mg | ORAL_TABLET | Freq: Four times a day (QID) | ORAL | Status: DC | PRN
  Administered 2023-04-02: 4 mg via ORAL
  Filled 2023-03-31: qty 1

## 2023-03-31 NOTE — Assessment & Plan Note (Addendum)
Rate controlled with amiodarone Continue Eliquis

## 2023-03-31 NOTE — Progress Notes (Signed)
RN Hand off called to Jo at [phone] at 1328am. Room not -cleaned/ready.

## 2023-03-31 NOTE — Telephone Encounter (Signed)
Phoned result to [facility] who reports that patient has been admitted to hospital room 126, I called [facility] and spoke to [place] and gave her these results as well

## 2023-03-31 NOTE — Telephone Encounter (Signed)
Lab called GI Panel positive for campilobacter

## 2023-03-31 NOTE — H&P (Signed)
History and Physical    Patient: [name] AOZ:[id number] DOB: [birth year] DOA: 03/31/2023 DOS: the patient was seen and examined on 03/31/2023 PCP: Glori Luis, MD  Patient coming from: Home  Chief Complaint: Diarrhea  HPI: [name] is a 77 y.o. 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  [name] states that for the last 1 week, he has been experiencing daily diarrhea with approximately 5-6 episodes per day at minimum.  He states that over the last couple days, he has also developed nausea with vomiting and has been unable to tolerate any p.o. intake including water.  He states that when he drinks any liquid, he either has immediate diarrhea or throws it back up.  He endorses dyspnea on exertion, generalized malaise and fatigue since diarrhea onset.  He denies any chest pain or palpitations.  Per chart review, patient had a video visit with his PCPs office on 8/20 at which time he reported experiencing congestion and coughing for 6 days prior with a positive COVID test.  At the time, he was prescribed doxycycline and prednisone.  Hospital course: On arrival to oncology's office patient was noted to be hypotensive at 86/44 with heart rate of 62.  He was saturating at 98% on room air.  He was afebrile at 97.6. Workup obtained and oncology's office notable for hemoglobin of 9.7, WBC of 6.8, potassium of 2.9, glucose 117, creatinine 1.21, with GFR above 60.  GI panel and C. difficile PCR were ordered and pending.  Patient started on IV fluids with improvement in blood pressure.  TR[facility] contacted for observation.  Review of Systems: As mentioned in the history of present illness. All other systems reviewed and are negative.  Past Medical  History:  Diagnosis Date   Anxiety    Arthritis    Atrial fibrillation (HCC)    a. Dx 2013, recurred 02/2014, CHA2DS2VASc = 3 -->placed on Eliquis;  b. 02/2014 Echo: EF 50-55%, mid and apical anterior septum and mid and apical inf septum are abnl, mild to mod Ao sclerosis w/o AS.   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 01/2021 LV Gram: EF 35-45%; b. 02/2021 Echo: EF 55-60%, no rwma, GrI DD, mildly reduced RV fxn, triv MR, mild-mod AoV sclerosis w/o stenosis.   Chronic lymphocytic leukemia (HCC)    a. Dx 02/2014.   Complication of anesthesia    History of  PTSD--do not touch patient when waking up from surgery.   COPD (chronic obstructive pulmonary disease) (HCC)    Coronary artery disease    a. 04/2009 CABG x 3 (LIMA->LAD, VG->OM1, VG->PDA);  b. 09/2009 Cath: occluded VG x 2 w/ patent LIMA and L->R collats. EF 55%, mild antlat HK;  c. 01/2021 Cath: LM mild diff dzs, LAD 100p, LCX nl, RCA 50p, 95d, 99 side branch, RPAV fills via LCX. VG->RPDA 100, VG->OM1 100, LIMA->LAD nl. EF 35-45%.   GERD (gastroesophageal reflux disease)    occasional   History of chemotherapy 2015-2016   HOH (hard of hearing)    Bilateral Hearing Aids   Hyperlipidemia LDL goal <70    Hypertension    Ischemic cardiomyopathy    a. 01/2021 LV Gram: EF 35-45%; b. 02/2021  Echo: EF 55-60%.   Myocardial infarction (HCC) 2010   OSA on CPAP    USE C-PAP   PTSD (post-traumatic stress disorder)    Rheumatic fever 1959   SSS (sick sinus syndrome) (HCC)    a. 02/2016 s/p MDT MRI compatible DC PPM (ser# [id number]).   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   Stroke [facility])    TIA (transient ischemic attack) 11/02/2015   Past Surgical History:  Procedure Laterality Date   ABDOMINAL HERNIA REPAIR     APPENDECTOMY  06/21/1985   CARDIAC CATHETERIZATION  2010; 2011   ; Dr Kirke Corin   CORONARY ARTERY BYPASS GRAFT  04/2009   "CABG X3"   EP  IMPLANTABLE DEVICE N/A 03/03/2016   Procedure: Pacemaker Implant;  Surgeon: Duke Salvia, MD;  Location: [facility] INVASIVE CV LAB;  Service: Cardiovascular;  Laterality: N/A;   FOREIGN BODY REMOVAL  1968   "shrapnel in my tailbone"   INGUINAL HERNIA REPAIR Right    INSERT / REPLACE / REMOVE PACEMAKER     JOINT REPLACEMENT Right 2018   LAPAROSCOPIC CHOLECYSTECTOMY     RIGHT/LEFT HEART CATH AND CORONARY/GRAFT ANGIOGRAPHY N/A 02/04/2021   Procedure: RIGHT/LEFT HEART CATH AND CORONARY/GRAFT ANGIOGRAPHY;  Surgeon: Yvonne Kendall, MD;  Location: [facility] INVASIVE CV LAB;  Service: Cardiovascular;  Laterality: N/A;   TONSILLECTOMY AND ADENOIDECTOMY  1956   TOTAL HIP ARTHROPLASTY Right 10/22/2016   Procedure: TOTAL HIP ARTHROPLASTY;  Surgeon: Donato Heinz, MD;  Location: [facility] ORS;  Service: Orthopedics;  Laterality: Right;   TOTAL HIP ARTHROPLASTY Left 11/04/2017   Procedure: TOTAL HIP ARTHROPLASTY;  Surgeon: Donato Heinz, MD;  Location: [facility] ORS;  Service: Orthopedics;  Laterality: Left;   Social History:  reports that he quit smoking about 16 years ago. His smoking use included cigarettes. He started smoking about 56 years ago. He has a 40 pack-year smoking history. He has never used smokeless tobacco. He reports that he does not currently use alcohol. He reports that he does not use drugs.  Allergies  Allergen Reactions   Atorvastatin Other (See Comments)    Muscle aching   Augmentin [Amoxicillin-Pot Clavulanate]     C diff    Family History  Problem Relation Age of Onset   Heart disease Mother    Heart attack Mother    Coronary artery disease Other        family history    Prior to Admission medications   Medication Sig Start Date End Date Taking? Authorizing Provider  acalabrutinib maleate (CALQUENCE) 100 MG tablet Take 1 tablet (100 mg total) by mouth 2 (two) times daily. 02/23/23   Earna Coder, MD  acetaminophen (TYLENOL) 325 MG tablet Take 1-2 tablets (325-650 mg total) by mouth  every 4 (four) hours as needed for mild pain. 11/01/21   Setzer, Lynnell Jude, PA-C  acyclovir (ZOVIRAX) 400 MG tablet TAKE 1 TABLET(400 MG) BY MOUTH TWICE DAILY 08/26/21   Earna Coder, MD  albuterol (VENTOLIN HFA) 108 (90 Base) MCG/ACT inhaler Inhale 2 puffs into the lungs every 6 (six) hours as needed for wheezing or shortness of breath. 11/01/21   Setzer, Lynnell Jude, PA-C  amiodarone (PACERONE) 200 MG tablet Take 1/2 tablet (100 mg) by mouth twice daily 5 days a week 04/14/22   Duke Salvia, MD  apixaban (ELIQUIS) 5 MG TABS tablet Take 5 mg by mouth 2 (two) times daily.    [provider]  azelastine (ASTELIN) 0.1 % nasal spray Place 2  sprays into both nostrils 2 (two) times daily. Use in each nostril as directed 03/10/23   Bethanie Dicker, NP  benzonatate (TESSALON) 200 MG capsule Take 1 capsule (200 mg total) by mouth 3 (three) times daily as needed for cough. Patient not taking: Reported on 03/31/2023 03/10/23   Bethanie Dicker, NP  budesonide-formoterol [facility]) 160-4.5 MCG/ACT inhaler Inhale 2 puffs into the lungs 2 (two) times daily. 12/01/16   Merwyn Katos, MD  cetirizine (ZYRTEC) 10 MG tablet Take 10 mg by mouth daily as needed for allergies.     [provider]  Coenzyme Q10 (COQ10) 200 MG CAPS Take 200 mg by mouth daily.    [provider]  diphenoxylate-atropine (LOMOTIL) 2.5-0.025 MG tablet Take by mouth 4 (four) times daily as needed for diarrhea or loose stools. Patient not taking: Reported on 03/31/2023    [provider]  Evolocumab (REPATHA SURECLICK) 140 MG/ML SOAJ Inject 140 mg into the skin every 14 (fourteen) days. 10/10/22   Antonieta Iba, MD  ezetimibe (ZETIA) 10 MG tablet Take 1 tablet (10 mg total) by mouth daily. 01/06/17 11/01/27  Antonieta Iba, MD  ferrous sulfate 325 (65 FE) MG EC tablet Take 325 mg by mouth 3 (three) times daily with meals.    [provider]  gabapentin (NEURONTIN) 300 MG capsule Take by mouth.     [provider]  Boris Lown Oil 350 MG CAPS Take 350 mg by mouth daily as needed (Heart).    [provider]  mirtazapine (REMERON) 15 MG tablet Take 15 mg by mouth at bedtime as needed (for panic associated with PTSD).  Patient not taking: Reported on 03/31/2023    [provider]  Multiple Vitamin (MULTIVITAMIN WITH MINERALS) TABS tablet Take 1 tablet by mouth daily.    [provider]  nitroGLYCERIN (NITROSTAT) 0.4 MG SL tablet Place 1 tablet (0.4 mg total) under the tongue every 5 (five) minutes as needed for chest pain. Patient not taking: Reported on 03/31/2023 01/06/17   Antonieta Iba, MD  ondansetron (ZOFRAN-ODT) 8 MG disintegrating tablet Take 1 tablet (8 mg total) by mouth every 8 (eight) hours as needed for nausea or vomiting. Patient not taking: Reported on 03/31/2023 12/06/22   Becky Augusta, NP  pantoprazole (PROTONIX) 40 MG tablet Take 1 tablet (40 mg total) by mouth daily. Patient not taking: Reported on 03/31/2023 01/09/23   Glori Luis, MD  predniSONE (DELTASONE) 20 MG tablet Take 2 tablets (40 mg total) by mouth daily. 03/10/23   Bethanie Dicker, NP  tamsulosin (FLOMAX) 0.4 MG CAPS capsule Take 1 capsule (0.4 mg total) by mouth daily. 01/09/23   Glori Luis, MD  Tiotropium Bromide Monohydrate 2.5 MCG/ACT AERS Inhale 2 puffs into the lungs at bedtime. Spiriva 08/07/20   [provider]  traMADol (ULTRAM) 50 MG tablet Take 1 tablet (50 mg total) by mouth every 12 (twelve) hours as needed. Patient not taking: Reported on 03/31/2023 02/25/23   Earna Coder, MD   Physical Exam: Vitals:   03/31/23 1653  BP: 112/75  Pulse: 64  Resp: 17  Temp: 98.3 F (36.8 C)  SpO2: 100%  Weight: 90.7 kg  Height: 5\' 8"  (1.727 m)   Physical Exam Vitals and nursing note reviewed.  Constitutional:      General: He is not in acute distress.    Appearance: He is normal weight.  HENT:     Mouth/Throat:     Mouth: Mucous membranes are dry.  Pharynx: Oropharynx is clear.  Eyes:     Conjunctiva/sclera: Conjunctivae normal.     Pupils: Pupils are equal, round, and reactive to light.  Cardiovascular:     Rate and Rhythm: Normal rate and regular rhythm.     Heart sounds: No murmur heard. Pulmonary:     Effort: Pulmonary effort is normal. No respiratory distress.     Breath sounds: Normal breath sounds. No wheezing, rhonchi or rales.  Abdominal:     General: There is no distension.     Palpations: Abdomen is soft.     Tenderness: There is abdominal tenderness (Bilateral lower quadrants).  Skin:    General: Skin is warm and dry.  Neurological:     General: No focal deficit present.     Mental Status: He is alert and oriented to person, place, and time. Mental status is at baseline.  Psychiatric:        Mood and Affect: Mood normal.        Behavior: Behavior normal.    Data Reviewed: CBC with WBC 6.8, hemoglobin of 9.7, MCV of 104 and platelets 156 CMP with sodium of 138, potassium 2.9, bicarb 23, glucose 117, creatinine 1.21, albumin 3.4, AST 13 and GFR above 60 Magnesium within normal limits at 2.1 GI panel pending C. difficile quick screen negative  There are no new results to review at this time.  Assessment and Plan:  Hypotension [name] presented to his oncologist office today with notable hypotension in the setting of hypovolemia due to gastroenteritis.  He has improved with IV fluid resuscitation  - Continue IV fluids overnight  Campylobacter gastroenteritis Patient has been experiencing watery diarrhea for 1 week in addition to nausea/vomiting, with GI panel positive for Campylobacter.  Given immunocompromise state in the setting of CLL on monoclonal antibody, will start azithromycin.  - IV fluids as noted above - Start azithromycin 500 mg oral daily - Zofran as needed for nausea  CAD (coronary artery disease) Patient denies any chest pain at this time.  - Continue home regimen  Essential  hypertension Given hypovolemic hypotension earlier, will hold home antihypertensives at this time  - Hold home antihypertensives.  Consider restarting prior to discharge if blood pressure has improved  Chronic diastolic CHF (congestive heart failure) (HCC) Patient appears hypovolemic on exam secondary to gastroenteritis.  Patient is not on any diuretics at home  - Daily weights  Paroxysmal atrial fibrillation (HCC) Rates well-controlled at this time  - Continue home regimen  CLL (chronic lymphocytic leukemia) (HCC) Patient has a history of recurrent stage IV CLL currently on Calquence.  - Resume home Calquence on discharge  COPD (chronic obstructive pulmonary disease) (HCC) Mild shortness of breath noted at this time in the setting of dehydration but no wheezing on examination.  - DuoNebs as needed  Advance Care Planning:   Code Status: Full Code   Consults: None  Family Communication: No family at bedside  Severity of Illness: The appropriate patient status for this patient is OBSERVATION. Observation status is judged to be reasonable and necessary in order to provide the required intensity of service to ensure the patient's safety. The patient's presenting symptoms, physical exam findings, and initial radiographic and laboratory data in the context of their medical condition is felt to place them at decreased risk for further clinical deterioration. Furthermore, it is anticipated that the patient will be medically stable for discharge from the hospital within 2 midnights of admission.   Author: [name], MD 03/31/2023 5:49  PM  For on call review [email or web address].

## 2023-03-31 NOTE — Assessment & Plan Note (Signed)
Patient was hypovolemic on admission secondary to gastroenteritis.   Patient is not on any diuretics at home

## 2023-03-31 NOTE — Telephone Encounter (Signed)
Wife called reportig that patient has n/v to scheduler. I called abnd spoke with her and she reports that patient has had diarrhea and n/v for past 5 days. She states that he has a chronic problem with diarrhea but for 5 days it has become worse and is pure liquid and he is going 10-12 tiems a day or moore. She also reports that he has uncomntrolled nausea and vomits 4 -5 times a day and is not eating and not wanting to eat, but she is making him drink. She has concern that he is getting dehydrated. She has been diving him Ondansetron for nausea and thought she picked up imodium at drug store but found when she got home that it was Nazeme for upstet stomach and nausea. She states that he has tested negative for COVID in the past few days and that they both had COVID last month. He is not running fever, but has episodes of feeling like he is freezing. He  has pain in his lower abdomen, but there is not bloating or distention that she notices. Please advise

## 2023-03-31 NOTE — Progress Notes (Unsigned)
[facility] at [facility] [phone] [phone]  Patient Care Team: Glori Luis, MD as PCP - General (Family Medicine) Mariah Milling Tollie Pizza, MD as PCP - Cardiology (Cardiology) Antonieta Iba, MD as Consulting Physician (Cardiology) Earna Coder, MD as Medical Oncologist (Hematology and Oncology) Keitha Butte, RN as Registered Nurse (Oncology)   NAME OF PATIENT: [name]  [id number]  July 19, 1946   DATE OF VISIT: 03/31/23  REASON FOR CONSULT: [name] is a 77 y.o. 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   INTERVAL HISTORY: Patient last saw Dr. Donneta Romberg on 02/25/2023.  At that time plan was to continue alcabrutinib with follow-up imaging ordered at time of next visit.  Patient was pending neurosurgery on spine.  Patient tested positive for COVID had virtual visit on 8/20 with primary care.  Was started on Doxy and prednisone.  Has subsequently developed persistent nausea, vomiting, and diarrhea on antibiotics.  Patient reports over the last week, he has had significant nausea, vomiting, and diarrhea.  Up to 5-10 watery diarrheal stools each day.  Denies blood.  Unable to keep down food or fluid and has had several episodes of vomiting yesterday.  Still taking his Calquence.  Denies fever but has had chills.  Denies urinary symptoms.  Has had lower abdominal discomfort.  Of note, patient reports that he had a similar presentation July of last year in which he was diagnosed with C. difficile colitis and ultimately had to be hospitalized for same.  PAST MEDICAL HISTORY: Past Medical History:  Diagnosis Date   Anxiety    Arthritis    Atrial fibrillation (HCC)    a. Dx 2013, recurred 02/2014, CHA2DS2VASc = 3 -->placed on Eliquis;  b. 02/2014 Echo: EF 50-55%, mid and apical anterior septum and mid and apical inf septum are  abnl, mild to mod Ao sclerosis w/o AS.   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 01/2021 LV Gram: EF 35-45%; b. 02/2021 Echo: EF 55-60%, no rwma, GrI DD, mildly reduced RV fxn, triv MR, mild-mod AoV sclerosis w/o stenosis.   Chronic lymphocytic leukemia (HCC)    a. Dx 02/2014.   Complication of anesthesia    History of  PTSD--do not touch patient when waking up from surgery.   COPD (chronic obstructive pulmonary disease) (HCC)    Coronary artery disease    a. 04/2009 CABG x 3 (LIMA->LAD, VG->OM1, VG->PDA);  b. 09/2009 Cath: occluded VG x 2 w/ patent LIMA and L->R collats. EF 55%, mild antlat HK;  c. 01/2021 Cath: LM mild diff dzs, LAD 100p, LCX nl, RCA 50p, 95d, 99 side branch, RPAV fills via LCX. VG->RPDA 100, VG->OM1 100, LIMA->LAD nl. EF 35-45%.   GERD (gastroesophageal reflux disease)    occasional   History of chemotherapy 2015-2016   HOH (hard of hearing)    Bilateral Hearing Aids   Hyperlipidemia LDL goal <70    Hypertension    Ischemic cardiomyopathy    a. 01/2021 LV Gram: EF 35-45%; b. 02/2021 Echo: EF 55-60%.   Myocardial infarction (HCC) 2010   OSA on CPAP    USE C-PAP   PTSD (post-traumatic stress disorder)    Rheumatic fever 1959   SSS (sick sinus syndrome) (HCC)    a. 02/2016 s/p MDT MRI compatible DC PPM (ser# [id number]).   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   Stroke [facility])    TIA (transient ischemic attack) 11/02/2015    PAST SURGICAL HISTORY:  Past Surgical History:  Procedure Laterality Date   ABDOMINAL HERNIA REPAIR     APPENDECTOMY  06/21/1985   CARDIAC CATHETERIZATION  2010; 2011   ; Dr Kirke Corin   CORONARY ARTERY BYPASS GRAFT  04/2009   "CABG X3"   EP IMPLANTABLE DEVICE N/A 03/03/2016   Procedure: Pacemaker Implant;  Surgeon: Duke Salvia, MD;  Location: [facility] INVASIVE CV LAB;  Service: Cardiovascular;  Laterality: N/A;   FOREIGN BODY REMOVAL  1968   "shrapnel in my  tailbone"   INGUINAL HERNIA REPAIR Right    INSERT / REPLACE / REMOVE PACEMAKER     JOINT REPLACEMENT Right 2018   LAPAROSCOPIC CHOLECYSTECTOMY     RIGHT/LEFT HEART CATH AND CORONARY/GRAFT ANGIOGRAPHY N/A 02/04/2021   Procedure: RIGHT/LEFT HEART CATH AND CORONARY/GRAFT ANGIOGRAPHY;  Surgeon: Yvonne Kendall, MD;  Location: [facility] INVASIVE CV LAB;  Service: Cardiovascular;  Laterality: N/A;   TONSILLECTOMY AND ADENOIDECTOMY  1956   TOTAL HIP ARTHROPLASTY Right 10/22/2016   Procedure: TOTAL HIP ARTHROPLASTY;  Surgeon: Donato Heinz, MD;  Location: [facility] ORS;  Service: Orthopedics;  Laterality: Right;   TOTAL HIP ARTHROPLASTY Left 11/04/2017   Procedure: TOTAL HIP ARTHROPLASTY;  Surgeon: Donato Heinz, MD;  Location: [facility] ORS;  Service: Orthopedics;  Laterality: Left;    HEMATOLOGY/ONCOLOGY HISTORY:  Oncology History Overview Note  # AUG 2015- SLL/CLL [Right Ax Ln Bx] s/p Benda-Rituxan x6 [finished March 2016]; Maintenance Rituxan q 41m [started April 2016; Dr.Pandit];Last Ritux Jan 2017.  MARCH 2017- CT N/C/A/P- NED. STOP Ritux; surveillance   # AUG 2019- CT/PET- progression/NO transformation;  # NOV 2019- Progression; started Ibrutinib 420 mg/d. STOPPED in end of feb sec to extreme fatigue/joint pains/cramps  #November 01, 2018-start ibrutinib 280 mg a day; December 20, 2018-discontinue ibrutinib secondary multiple side effects.   #January 03, 2019 start Gazyva + Ven [July]; finished Dec 2020-; started Peter Minium maintenance [200 mg a day; DI-amiodarone]; SEP 2022- congestive heart failure; s/p cardiac cath-noted to have CAD however no stents placed.  2D echo- EF-55%  #Mid March 2021-venetoclax dose reduced to 100 mg [second diarrhea]. HELD in DEC 2022/JAN 2023 x3 months-multiple admissions to the hospital/pneumonia infection stroke x 3 m.   # MAY 2023-progression of disease in the neck ches; Nov 18, 2021-RE-started venetoclax;   # JUNE 29th, 2023-progression of disease in the neck- STOP VENAOCLAX;   # AUG  16th, 2023 START ACALABRUTINIB 100 mg BID [previous intolerance to ibrutinib; delayed sec to C.diff]    #? 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; IGVH- UN-MUTATED [poor prognosis]  SURVIVORSHIP: p  DIAGNOSIS: CLL   STAGE: IV  ;GOALS: control  CURRENT/MOST RECENT THERAPY : venetoclax     CLL (chronic lymphocytic leukemia) (HCC)  01/03/2019 - 06/02/2019 Chemotherapy   Patient is on Treatment Plan : LEUKEMIA CLL Obinutuzumab q28d       ALLERGIES:  is allergic to atorvastatin and augmentin [amoxicillin-pot clavulanate].  MEDICATIONS:  Current Outpatient Medications  Medication Sig Dispense Refill   acalabrutinib maleate (CALQUENCE) 100 MG tablet Take 1 tablet (100 mg total) by mouth 2 (two) times daily.  60 tablet 2   acetaminophen (TYLENOL) 325 MG tablet Take 1-2 tablets (325-650 mg total) by mouth every 4 (four) hours as needed for mild pain.     acyclovir (ZOVIRAX) 400 MG tablet TAKE 1 TABLET(400 MG) BY MOUTH TWICE DAILY 60 tablet 6   albuterol (VENTOLIN HFA) 108 (90 Base) MCG/ACT inhaler Inhale 2 puffs into the lungs every 6 (six) hours as needed for wheezing or shortness of breath. 1 each 0   amiodarone (PACERONE) 200 MG tablet Take 1/2 tablet (100 mg) by mouth twice daily 5 days a week 60 tablet 6   apixaban (ELIQUIS) 5 MG TABS tablet Take 5 mg by mouth 2 (two) times daily.     azelastine (ASTELIN) 0.1 % nasal spray Place 2 sprays into both nostrils 2 (two) times daily. Use in each nostril as directed 30 mL 1   budesonide-formoterol (SYMBICORT) 160-4.5 MCG/ACT inhaler Inhale 2 puffs into the lungs 2 (two) times daily. 1 Inhaler 11   cetirizine (ZYRTEC) 10 MG tablet Take 10  mg by mouth daily as needed for allergies.      Coenzyme Q10 (COQ10) 200 MG CAPS Take 200 mg by mouth daily.     Evolocumab (REPATHA SURECLICK) 140 MG/ML SOAJ Inject 140 mg into the skin every 14 (fourteen) days. 2 mL 11   ezetimibe (ZETIA) 10 MG tablet Take 1 tablet (10 mg total) by mouth daily. 90 tablet 3   ferrous sulfate 325 (65 FE) MG EC tablet Take 325 mg by mouth 3 (three) times daily with meals.     gabapentin (NEURONTIN) 300 MG capsule Take by mouth.     Krill Oil 350 MG CAPS Take 350 mg by mouth daily as needed (Heart).     predniSONE (DELTASONE) 20 MG tablet Take 2 tablets (40 mg total) by mouth daily. 10 tablet 0   tamsulosin (FLOMAX) 0.4 MG CAPS capsule Take 1 capsule (0.4 mg total) by mouth daily. 30 capsule 3   Tiotropium Bromide Monohydrate 2.5 MCG/ACT AERS Inhale 2 puffs into the lungs at bedtime. Spiriva     benzonatate (TESSALON) 200 MG capsule Take 1 capsule (200 mg total) by mouth 3 (three) times daily as needed for cough. (Patient not taking: Reported on 03/31/2023) 30 capsule 0   diphenoxylate-atropine (LOMOTIL) 2.5-0.025 MG tablet Take by mouth 4 (four) times daily as needed for diarrhea or loose stools. (Patient not taking: Reported on 03/31/2023)     mirtazapine (REMERON) 15 MG tablet Take 15 mg by mouth at bedtime as needed (for panic associated with PTSD).  (Patient not taking: Reported on 03/31/2023)     Multiple Vitamin (MULTIVITAMIN WITH MINERALS) TABS tablet Take 1 tablet by mouth daily.     nitroGLYCERIN (NITROSTAT) 0.4 MG SL tablet Place 1 tablet (0.4 mg total) under the tongue every 5 (five) minutes as needed for chest pain. (Patient not taking: Reported on 03/31/2023) 25 tablet 6   ondansetron (ZOFRAN-ODT) 8 MG disintegrating tablet Take 1 tablet (8 mg total) by mouth every 8 (eight) hours as needed for nausea or vomiting. (Patient not taking: Reported on 03/31/2023) 20 tablet 0   pantoprazole (PROTONIX) 40 MG tablet Take 1 tablet (40 mg total) by mouth daily. (Patient  not taking: Reported on 03/31/2023) 30 tablet 2   traMADol (ULTRAM) 50 MG tablet Take 1 tablet (50 mg total) by mouth every 12 (twelve) hours as needed. (Patient not taking: Reported on 03/31/2023) 60 tablet 0   No current facility-administered medications for this visit.  VITAL SIGNS: BP (!) 86/44   Pulse 62   Temp 97.6 F (36.4 C) (Tympanic)   Resp 18   Ht 5' 8.5" (1.74 m)   Wt 200 lb (90.7 kg)   SpO2 98%   BMI 29.97 kg/m  Filed Weights   03/31/23 1300  Weight: 200 lb (90.7 kg)    Estimated body mass index is 29.97 kg/m as calculated from the following:   Height as of this encounter: 5' 8.5" (1.74 m).   Weight as of this encounter: 200 lb (90.7 kg).  LABS: CBC:    Component Value Date/Time   WBC 6.8 03/31/2023 1240   WBC 12.6 (H) 12/06/2022 0953   HGB 9.7 (L) 03/31/2023 1240   HGB 12.5 (L) 03/11/2021 1136   HCT 30.3 (L) 03/31/2023 1240   HCT 36.9 (L) 03/11/2021 1136   PLT 156 03/31/2023 1240   PLT 159 03/11/2021 1136   MCV 104.5 (H) 03/31/2023 1240   MCV 100 (H) 03/11/2021 1136   MCV 95 10/24/2014 0837   NEUTROABS 2.7 03/31/2023 1240   NEUTROABS 3.1 10/24/2014 0837   LYMPHSABS 3.4 03/31/2023 1240   LYMPHSABS 0.8 (L) 10/24/2014 0837   MONOABS 0.6 03/31/2023 1240   MONOABS 0.6 10/24/2014 0837   EOSABS 0.0 03/31/2023 1240   EOSABS 0.2 10/24/2014 0837   BASOSABS 0.0 03/31/2023 1240   BASOSABS 0.0 10/24/2014 0837   Comprehensive Metabolic Panel:    Component Value Date/Time   NA 138 03/31/2023 1241   NA 143 03/11/2021 1136   NA 139 10/11/2014 1800   K 2.9 (L) 03/31/2023 1241   K 3.3 (L) 10/11/2014 1800   CL 107 03/31/2023 1241   CL 106 10/11/2014 1800   CO2 23 03/31/2023 1241   CO2 27 10/11/2014 1800   BUN 16 03/31/2023 1241   BUN 13 03/11/2021 1136   BUN 15 10/11/2014 1800   CREATININE 1.21 03/31/2023 1241   CREATININE 0.89 10/11/2014 1800   GLUCOSE 117 (H) 03/31/2023 1241   GLUCOSE 107 (H) 10/11/2014 1800   CALCIUM 8.4 (L) 03/31/2023 1241    CALCIUM 8.8 (L) 10/11/2014 1800   AST 13 (L) 03/31/2023 1241   ALT 14 03/31/2023 1241   ALT 22 10/11/2014 1800   ALKPHOS 76 03/31/2023 1241   ALKPHOS 61 10/11/2014 1800   BILITOT 0.7 03/31/2023 1241   PROT 6.5 03/31/2023 1241   PROT 6.7 05/18/2017 1048   PROT 6.4 (L) 10/11/2014 1800   ALBUMIN 3.4 (L) 03/31/2023 1241   ALBUMIN 4.3 05/18/2017 1048   ALBUMIN 4.1 10/11/2014 1800    RADIOGRAPHIC STUDIES: No results found.  PERFORMANCE STATUS (ECOG) : 3 - Symptomatic, >50% confined to bed  Review of Systems Unless otherwise noted, a complete review of systems is negative.  Physical Exam General: pale, frail appearing Cardiovascular: regular rate and rhythm Pulmonary: clear ant fields Abdomen: soft, tender lower quads, + bowel sounds GU: no suprapubic tenderness Extremities: no edema, no joint deformities Skin: no rashes Neurological: Weakness but otherwise nonfocal  IMPRESSION/PLAN: Stage IV CLL -recommend holding alcabrutinib given acute GI symptoms.  N/V/D -  Nursing had significant difficulty placing peripheral IV due to profound dehydration.  Patient hypotensive and profoundly weak and wife had significant difficulty getting him to the clinic.  Discussed with Dr. Donneta Romberg and recommendation would be for hospitalization.  Patient initially reluctant but ultimately agreed. Discussed with Dr. Huel Cote for direct admission. GI panel + for Campylobacter.  Negative for C. difficile.  Hypokalemia -likely from GI loss/poor oral intake.  Magnesium normal.  Replete with IV K  Case and plan discussed with Dr. Donneta Romberg  Patient expressed understanding and was in agreement with this plan. He also understands that He can call clinic at any time with any questions, concerns, or complaints.   Thank you for allowing me to participate in the care of this very pleasant patient.   Time Total: 25 minutes  Visit consisted of counseling and education dealing with the complex and  emotionally intense issues of symptom management in the setting of serious illness.Greater than 50%  of this time was spent counseling and coordinating care related to the above assessment and plan.  Signed by: Laurette Schimke, PhD, NP-C

## 2023-03-31 NOTE — Progress Notes (Signed)
PHARMACY CONSULT NOTE - FOLLOW UP  Pharmacy Consult for Electrolyte Monitoring and Replacement   Recent Labs: Potassium (mmol/L)  Date Value  03/31/2023 2.9 (L)  10/11/2014 3.3 (L)   Magnesium (mg/dL)  Date Value  16/04/9603 2.1  10/11/2014 1.9   Calcium (mg/dL)  Date Value  54/03/8118 8.4 (L)   Calcium, Total (mg/dL)  Date Value  14/78/2956 8.8 (L)   Albumin (g/dL)  Date Value  21/30/8657 3.4 (L)  05/18/2017 4.3  10/11/2014 4.1   Phosphorus (mg/dL)  Date Value  84/69/6295 2.6   Sodium (mmol/L)  Date Value  03/31/2023 138  03/11/2021 143  10/11/2014 139     Assessment: 9/10 @ 1241 :   K = 2.9 -  KCl 40 mEq PO X 1 given @ 1906                            Ca = 8.4,  Alb = 3.4,  Corrected Ca = 8.88                                Goal of Therapy:  Electrolytes WNL   Plan:  Will order KCl 10 mEq IV X 2 and recheck electrolytes on 9/11 with AM labs.   Scherrie Gerlach ,PharmD Clinical Pharmacist 03/31/2023 10:22 PM

## 2023-03-31 NOTE — Assessment & Plan Note (Signed)
[name] presented to his oncologist office today with notable hypotension in the setting of hypovolemia due to gastroenteritis.  He has improved with IV fluid resuscitation  - Continue IV fluids overnight

## 2023-03-31 NOTE — Assessment & Plan Note (Signed)
Patient has been experiencing watery diarrhea for 1 week in addition to nausea/vomiting, with GI panel positive for Campylobacter.  Given immunocompromise state in the setting of CLL on monoclonal antibody, will start azithromycin.  - IV fluids as noted above - Start azithromycin 500 mg oral daily - Zofran as needed for nausea

## 2023-03-31 NOTE — Assessment & Plan Note (Signed)
Patient has a history of recurrent stage IV CLL currently on Calquence.  - Resume home Calquence on discharge

## 2023-03-31 NOTE — Telephone Encounter (Signed)
Appointment offered for 1245 today for lab and then see Symptom Management Clinic, appointment accepted by [facility]

## 2023-03-31 NOTE — Assessment & Plan Note (Signed)
Mild shortness of breath noted at this time in the setting of dehydration but no wheezing on examination.  - DuoNebs as needed

## 2023-03-31 NOTE — Assessment & Plan Note (Signed)
He does not appear to be taking medications for this issue at this time  He was hypotensive on presentation and so meds would be held regardless

## 2023-03-31 NOTE — Assessment & Plan Note (Signed)
Compensated at this time

## 2023-04-01 ENCOUNTER — Encounter: Payer: Self-pay | Admitting: Internal Medicine

## 2023-04-01 DIAGNOSIS — E6609 Other obesity due to excess calories: Secondary | ICD-10-CM

## 2023-04-01 DIAGNOSIS — A045 Campylobacter enteritis: Principal | ICD-10-CM

## 2023-04-01 LAB — CBC
HCT: 26.5 % — ABNORMAL LOW (ref 39.0–52.0)
Hemoglobin: 8.5 g/dL — ABNORMAL LOW (ref 13.0–17.0)
MCH: 32.8 pg (ref 26.0–34.0)
MCHC: 32.1 g/dL (ref 30.0–36.0)
MCV: 102.3 fL — ABNORMAL HIGH (ref 80.0–100.0)
Platelets: 154 10*3/uL (ref 150–400)
RBC: 2.59 MIL/uL — ABNORMAL LOW (ref 4.22–5.81)
RDW: 14.1 % (ref 11.5–15.5)
WBC: 7.9 10*3/uL (ref 4.0–10.5)
nRBC: 0 % (ref 0.0–0.2)

## 2023-04-01 LAB — BASIC METABOLIC PANEL
Anion gap: 8 (ref 5–15)
BUN: 12 mg/dL (ref 8–23)
CO2: 23 mmol/L (ref 22–32)
Calcium: 7.9 mg/dL — ABNORMAL LOW (ref 8.9–10.3)
Chloride: 108 mmol/L (ref 98–111)
Creatinine, Ser: 0.92 mg/dL (ref 0.61–1.24)
GFR, Estimated: >60 mL/min (ref >60)
Glucose, Bld: 101 mg/dL — ABNORMAL HIGH (ref 70–99)
Potassium: 3.2 mmol/L — ABNORMAL LOW (ref 3.5–5.1)
Sodium: 139 mmol/L (ref 135–145)

## 2023-04-01 MED ORDER — POTASSIUM CHLORIDE CRYS ER 20 MEQ PO TBCR: 40.0000 meq | EXTENDED_RELEASE_TABLET | ORAL | Status: DC

## 2023-04-01 MED ORDER — LACTATED RINGERS IV SOLN: INTRAVENOUS | Status: DC

## 2023-04-01 MED ORDER — POTASSIUM CHLORIDE CRYS ER 10 MEQ PO TBCR
40.0000 meq | EXTENDED_RELEASE_TABLET | ORAL | Status: AC
  Administered 2023-04-01 (×2): 40 meq via ORAL
  Filled 2023-04-01 (×2): qty 4

## 2023-04-01 NOTE — Assessment & Plan Note (Signed)
Continue Zetia Also on Repatha as an outpatient

## 2023-04-01 NOTE — Hospital Course (Signed)
76yo with h/o recurrent stage 4 CLL on Alcabrutinib, chronic HFrEF, afib on Eliquis, CAD s/p CABG, pacemaker placement, CVA, HTN, and HLD who presented on 9/10 with diarrhea.  He was previously diagnosed with COVID on 8/20 and treated with doxycycline and prednisone.  GI pathogen panel is position for Campylobacter and he was started on Azithromycin.

## 2023-04-01 NOTE — Consult Note (Signed)
PHARMACY CONSULT NOTE - ELECTROLYTES  Pharmacy Consult for Electrolyte Monitoring and Replacement   Recent Labs: Height: 5\' 8"  (172.7 cm) Weight: 91 kg (200 lb 9.9 oz) IBW/kg (Calculated) : 68.4 Estimated Creatinine Clearance: 73.6 mL/min (by C-G formula based on SCr of 0.92 mg/dL). Potassium (mmol/L)  Date Value  04/01/2023 3.2 (L)  10/11/2014 3.3 (L)   Magnesium (mg/dL)  Date Value  69/62/9528 2.1  10/11/2014 1.9   Calcium (mg/dL)  Date Value  41/32/4401 7.9 (L)   Calcium, Total (mg/dL)  Date Value  02/72/5366 8.8 (L)   Albumin (g/dL)  Date Value  44/09/4740 3.4 (L)  05/18/2017 4.3  10/11/2014 4.1   Phosphorus (mg/dL)  Date Value  59/56/3875 2.6   Sodium (mmol/L)  Date Value  04/01/2023 139  03/11/2021 143  10/11/2014 139   Corrected Ca: 8.38 mg/dL  Assessment  [name] is a 77 y.o. male presenting with diarrhea. 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 Pharmacy has been consulted to monitor and replace electrolytes.  Diet: regular  Goal of Therapy: Electrolytes WNL  Plan:  Kcl x 2 doses ordered Check BMP, Mg, Phos with AM labs  Thank you for allowing pharmacy to be a part of this patient's care.  Bettey Costa, PharmD Clinical Pharmacist 04/01/2023 7:46 AM

## 2023-04-01 NOTE — Progress Notes (Signed)
Progress Note   Patient: [name] MWU:[id number] DOB: [birth year] DOA: 03/31/2023     1 DOS: the patient was seen and examined on 04/01/2023   Brief hospital course: 77yo with h/o recurrent stage 4 CLL on Alcabrutinib, chronic HFrEF, afib on Eliquis, CAD s/p CABG, pacemaker placement, CVA, HTN, and HLD who presented on 9/10 with diarrhea.  He was previously diagnosed with COVID on 8/20 and treated with doxycycline and prednisone.  GI pathogen panel is position for Campylobacter and he was started on Azithromycin.  Assessment and Plan: * Campylobacter gastroenteritis Patient has been experiencing watery diarrhea for 1 week in addition to nausea/vomiting, with GI panel positive for Campylobacter Given immunocompromised state in the setting of CLL on monoclonal antibody, will treat with antibiotics Treat with Azithromycin 500 mg oral daily Zofran as needed for nausea  Hypotension [name] presented to his oncologist office on 9/10 with notable hypotension in the setting of hypovolemia due to gastroenteritis.   He has improved with IV fluid resuscitation Continue LR at 75 cc/hr now to balance ongoing fluid losses with persistent diarrhea  CAD (coronary artery disease) Compensated at this time  Essential hypertension He does not appear to be taking medications for this issue at this time  He was hypotensive on presentation and so meds would be held regardless  Chronic diastolic CHF (congestive heart failure) (HCC) Patient was hypovolemic on admission secondary to gastroenteritis.   Patient is not on any diuretics at home  Paroxysmal atrial fibrillation (HCC) Rate controlled with amiodarone Continue Eliquis  CLL (chronic lymphocytic leukemia) (HCC) Patient has a history of recurrent stage IV CLL currently on Calquence Resume home Calquence on discharge  COPD (chronic obstructive pulmonary disease) (HCC) DuoNebs as needed Continue Spiriva, Symbicort (Dulera per  formulary)  Class 1 obesity due to excess calories with body mass index (BMI) of 30.0 to 30.9 in adult Body mass index is 30.5 kg/m.Marland Kitchen  Weight loss should be encouraged Outpatient PCP/bariatric medicine f/u encouraged   OSA on CPAP Continue CPAP  Hyperlipidemia Continue Zetia Also on Repatha as an outpatient     Consultants: None  Procedures: None  Antibiotics: Azithromycin 9/10      Subjective: Still having diarrhea - 4 episodes so far today.  Felt good upon awakening but now fatigued, didn't sleep well.   Objective: Vitals:   04/01/23 0833 04/01/23 1533  BP: (!) 112/58 (!) 109/57  Pulse: 61 60  Resp: 19   Temp: 98.7 F (37.1 C) 97.8 F (36.6 C)  SpO2: 98% 97%   No intake or output data in the 24 hours ending 04/01/23 1533 Filed Weights   03/31/23 1653 03/31/23 1800 04/01/23 0500  Weight: 90.7 kg 90.7 kg 91 kg    Exam:  General:  Appears calm and comfortable and is in NAD Eyes:  EOMI, normal lids, iris ENT:  grossly normal hearing, lips & tongue, mmm Neck:  no LAD, masses or thyromegaly Cardiovascular:  RRR, no m/r/g. No LE edema.  Respiratory:   CTA bilaterally with no wheezes/rales/rhonchi.  Normal respiratory effort. Abdomen:  soft, mild diffuse TTP, ND Skin:  no rash or induration seen on limited exam Musculoskeletal:  grossly normal tone BUE/BLE, good ROM, no bony abnormality Psychiatric:  grossly normal mood and affect, speech fluent and appropriate, AOx3 Neurologic:  CN 2-12 grossly intact, moves all extremities in coordinated fashion, sensation intact  Data Reviewed: I have reviewed the patient's lab results since admission.  Pertinent labs for today include:   K+ 3.2  WBC 7.9 Hgb 8.5, down from 9.7 on 9/10 and 10.8 on 8/7     Family Communication: None present; we spoke with his wife by telephone at the time of the encounter  Disposition: Status is: Observation The patient remains OBS appropriate and will d/c before 2  midnights.     Time spent: 50 minutes  Unresulted Labs (From admission, onward)     Start     Ordered   04/02/23 0500  Magnesium  Tomorrow morning,   R       Question:  Specimen collection method  Answer:  Lab=Lab collect   04/01/23 0749   04/02/23 0500  Phosphorus  Tomorrow morning,   R       Question:  Specimen collection method  Answer:  Lab=Lab collect   04/01/23 0749   04/02/23 0500  Basic metabolic panel  Tomorrow morning,   R       Question:  Specimen collection method  Answer:  Lab=Lab collect   04/01/23 0749   04/02/23 0500  CBC with Differential/Platelet  Tomorrow morning,   R       Question:  Specimen collection method  Answer:  Lab=Lab collect   04/01/23 1533             Author: Jonah Blue, MD 04/01/2023 3:33 PM  For on call review [email or web address].

## 2023-04-01 NOTE — Assessment & Plan Note (Signed)
Continue CPAP.  

## 2023-04-01 NOTE — Assessment & Plan Note (Signed)
Body mass index is 30.5 kg/m.[name]  Weight loss should be encouraged Outpatient PCP/bariatric medicine f/u encouraged

## 2023-04-02 DIAGNOSIS — Z8249 Family history of ischemic heart disease and other diseases of the circulatory system: Secondary | ICD-10-CM | POA: Diagnosis not present

## 2023-04-02 DIAGNOSIS — E860 Dehydration: Secondary | ICD-10-CM | POA: Diagnosis present

## 2023-04-02 DIAGNOSIS — E861 Hypovolemia: Secondary | ICD-10-CM | POA: Diagnosis present

## 2023-04-02 DIAGNOSIS — I110 Hypertensive heart disease with heart failure: Secondary | ICD-10-CM | POA: Diagnosis present

## 2023-04-02 DIAGNOSIS — I5032 Chronic diastolic (congestive) heart failure: Secondary | ICD-10-CM | POA: Diagnosis present

## 2023-04-02 DIAGNOSIS — I255 Ischemic cardiomyopathy: Secondary | ICD-10-CM | POA: Diagnosis present

## 2023-04-02 DIAGNOSIS — E785 Hyperlipidemia, unspecified: Secondary | ICD-10-CM | POA: Diagnosis present

## 2023-04-02 DIAGNOSIS — I495 Sick sinus syndrome: Secondary | ICD-10-CM | POA: Diagnosis present

## 2023-04-02 DIAGNOSIS — A045 Campylobacter enteritis: Secondary | ICD-10-CM | POA: Diagnosis present

## 2023-04-02 DIAGNOSIS — I480 Paroxysmal atrial fibrillation: Secondary | ICD-10-CM | POA: Diagnosis present

## 2023-04-02 DIAGNOSIS — F4310 Post-traumatic stress disorder, unspecified: Secondary | ICD-10-CM | POA: Diagnosis present

## 2023-04-02 DIAGNOSIS — Z87891 Personal history of nicotine dependence: Secondary | ICD-10-CM | POA: Diagnosis not present

## 2023-04-02 DIAGNOSIS — G4733 Obstructive sleep apnea (adult) (pediatric): Secondary | ICD-10-CM | POA: Diagnosis present

## 2023-04-02 DIAGNOSIS — Z8616 Personal history of COVID-19: Secondary | ICD-10-CM | POA: Diagnosis not present

## 2023-04-02 DIAGNOSIS — J449 Chronic obstructive pulmonary disease, unspecified: Secondary | ICD-10-CM | POA: Diagnosis present

## 2023-04-02 DIAGNOSIS — C9110 Chronic lymphocytic leukemia of B-cell type not having achieved remission: Secondary | ICD-10-CM | POA: Diagnosis present

## 2023-04-02 DIAGNOSIS — K219 Gastro-esophageal reflux disease without esophagitis: Secondary | ICD-10-CM | POA: Diagnosis present

## 2023-04-02 DIAGNOSIS — E6609 Other obesity due to excess calories: Secondary | ICD-10-CM | POA: Diagnosis present

## 2023-04-02 DIAGNOSIS — H9193 Unspecified hearing loss, bilateral: Secondary | ICD-10-CM | POA: Diagnosis present

## 2023-04-02 DIAGNOSIS — I959 Hypotension, unspecified: Secondary | ICD-10-CM | POA: Diagnosis present

## 2023-04-02 DIAGNOSIS — Z7901 Long term (current) use of anticoagulants: Secondary | ICD-10-CM | POA: Diagnosis not present

## 2023-04-02 DIAGNOSIS — I252 Old myocardial infarction: Secondary | ICD-10-CM | POA: Diagnosis not present

## 2023-04-02 DIAGNOSIS — I2510 Atherosclerotic heart disease of native coronary artery without angina pectoris: Secondary | ICD-10-CM | POA: Diagnosis present

## 2023-04-02 DIAGNOSIS — Z6830 Body mass index (BMI) 30.0-30.9, adult: Secondary | ICD-10-CM | POA: Diagnosis not present

## 2023-04-02 LAB — CBC WITH DIFFERENTIAL/PLATELET
Abs Immature Granulocytes: 0.04 10*3/uL (ref 0.00–0.07)
Basophils Absolute: 0 10*3/uL (ref 0.0–0.1)
Basophils Relative: 0 %
Eosinophils Absolute: 0.2 10*3/uL (ref 0.0–0.5)
Eosinophils Relative: 2 %
HCT: 27.6 % — ABNORMAL LOW (ref 39.0–52.0)
Hemoglobin: 8.9 g/dL — ABNORMAL LOW (ref 13.0–17.0)
Immature Granulocytes: 1 %
Lymphocytes Relative: 52 %
Lymphs Abs: 3.3 10*3/uL (ref 0.7–4.0)
MCH: 33.1 pg (ref 26.0–34.0)
MCHC: 32.2 g/dL (ref 30.0–36.0)
MCV: 102.6 fL — ABNORMAL HIGH (ref 80.0–100.0)
Monocytes Absolute: 0.4 10*3/uL (ref 0.1–1.0)
Monocytes Relative: 6 %
Neutro Abs: 2.5 10*3/uL (ref 1.7–7.7)
Neutrophils Relative %: 39 %
Platelets: 140 10*3/uL — ABNORMAL LOW (ref 150–400)
RBC: 2.69 MIL/uL — ABNORMAL LOW (ref 4.22–5.81)
RDW: 13.7 % (ref 11.5–15.5)
WBC: 6.5 10*3/uL (ref 4.0–10.5)
nRBC: 0 % (ref 0.0–0.2)

## 2023-04-02 LAB — BASIC METABOLIC PANEL
Anion gap: 6 (ref 5–15)
BUN: 8 mg/dL (ref 8–23)
CO2: 23 mmol/L (ref 22–32)
Calcium: 8.1 mg/dL — ABNORMAL LOW (ref 8.9–10.3)
Chloride: 109 mmol/L (ref 98–111)
Creatinine, Ser: 0.86 mg/dL (ref 0.61–1.24)
GFR, Estimated: >60 mL/min (ref >60)
Glucose, Bld: 102 mg/dL — ABNORMAL HIGH (ref 70–99)
Potassium: 3.2 mmol/L — ABNORMAL LOW (ref 3.5–5.1)
Sodium: 138 mmol/L (ref 135–145)

## 2023-04-02 LAB — MAGNESIUM: Magnesium: 2.1 mg/dL (ref 1.7–2.4)

## 2023-04-02 LAB — PHOSPHORUS: Phosphorus: 3.3 mg/dL (ref 2.5–4.6)

## 2023-04-02 MED ORDER — POTASSIUM CHLORIDE CRYS ER 20 MEQ PO TBCR: 40.0000 meq | EXTENDED_RELEASE_TABLET | ORAL | Status: DC

## 2023-04-02 MED ORDER — POTASSIUM CHLORIDE CRYS ER 10 MEQ PO TBCR
40.0000 meq | EXTENDED_RELEASE_TABLET | ORAL | Status: AC
  Administered 2023-04-02 (×3): 40 meq via ORAL
  Filled 2023-04-02 (×3): qty 4

## 2023-04-02 NOTE — Consult Note (Signed)
PHARMACY CONSULT NOTE - ELECTROLYTES  Pharmacy Consult for Electrolyte Monitoring and Replacement   Recent Labs: Height: 5\' 8"  (172.7 cm) Weight: 91 kg (200 lb 9.9 oz) IBW/kg (Calculated) : 68.4 Estimated Creatinine Clearance: 78.8 mL/min (by C-G formula based on SCr of 0.86 mg/dL). Potassium (mmol/L)  Date Value  04/02/2023 3.2 (L)  10/11/2014 3.3 (L)   Magnesium (mg/dL)  Date Value  21/30/8657 2.1  10/11/2014 1.9   Calcium (mg/dL)  Date Value  84/69/6295 8.1 (L)   Calcium, Total (mg/dL)  Date Value  28/41/3244 8.8 (L)   Albumin (g/dL)  Date Value  07/23/7251 3.4 (L)  05/18/2017 4.3  10/11/2014 4.1   Phosphorus (mg/dL)  Date Value  66/44/0347 3.3   Sodium (mmol/L)  Date Value  04/02/2023 138  03/11/2021 143  10/11/2014 139   Corrected Ca: 8.58 mg/dL  Assessment  [name] is a 77 y.o. male presenting with diarrhea. 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 Pharmacy has been consulted to monitor and replace electrolytes.  Diet: regular  Goal of Therapy: Electrolytes WNL  Plan:  Kcl x 3 doses ordered Check BMP, Mg, Phos with AM labs  Thank you for allowing pharmacy to be a part of this patient's care.  Bettey Costa, PharmD Clinical Pharmacist 04/02/2023 7:51 AM

## 2023-04-02 NOTE — Plan of Care (Signed)

## 2023-04-02 NOTE — Progress Notes (Signed)
Progress Note   Patient: [name] ZOX:[id number] DOB: Dec 28, 1945 DOA: 03/31/2023     1 DOS: the patient was seen and examined on 04/02/2023   Brief hospital course: 77yo with h/o recurrent stage 4 CLL on Alcabrutinib, chronic HFrEF, afib on Eliquis, CAD s/p CABG, pacemaker placement, CVA, HTN, and HLD who presented on 9/10 with diarrhea.  He was previously diagnosed with COVID on 8/20 and treated with doxycycline and prednisone.  GI pathogen panel is position for Campylobacter and he was started on Azithromycin.  Assessment and Plan: * Campylobacter gastroenteritis Patient has been experiencing watery diarrhea for 1 week in addition to nausea/vomiting, with GI panel positive for Campylobacter Given immunocompromised state in the setting of CLL on monoclonal antibody, will treat with antibiotics Treat with Azithromycin 500 mg oral daily Zofran as needed for nausea  Hypotension Mr. Andreen presented to his oncologist office on 9/10 with notable hypotension in the setting of hypovolemia due to gastroenteritis.   He has improved with IV fluid resuscitation Continue LR at 75 cc/hr now to balance ongoing fluid losses with persistent diarrhea  CAD (coronary artery disease) Compensated at this time  Essential hypertension He does not appear to be taking medications for this issue at this time  He was hypotensive on presentation and so meds would be held regardless  Chronic diastolic CHF (congestive heart failure) (HCC) Patient was hypovolemic on admission secondary to gastroenteritis.   Patient is not on any diuretics at home  Paroxysmal atrial fibrillation (HCC) Rate controlled with amiodarone Continue Eliquis  CLL (chronic lymphocytic leukemia) (HCC) Patient has a history of recurrent stage IV CLL currently on Calquence Resume home Calquence on discharge  COPD (chronic obstructive pulmonary disease) (HCC) DuoNebs as needed Continue Spiriva, Symbicort (Dulera per  formulary)  Class 1 obesity due to excess calories with body mass index (BMI) of 30.0 to 30.9 in adult Body mass index is 30.5 kg/m.Marland Kitchen  Weight loss should be encouraged Outpatient PCP/bariatric medicine f/u encouraged   OSA on CPAP Continue CPAP  Hyperlipidemia Continue Zetia Also on Repatha as an outpatient     Consultants: None   Procedures: None   Antibiotics: Azithromycin 9/10        Subjective: Still having diarrhea, no incontinence (so precautions are not needed).  Frustrated with lack of improvement so far.   Objective: Vitals:   04/02/23 0513 04/02/23 0747  BP: 138/65 138/70  Pulse: 68 62  Resp: 20 18  Temp: 98.5 F (36.9 C) 98.5 F (36.9 C)  SpO2: 99% 98%   No intake or output data in the 24 hours ending 04/02/23 1541 Filed Weights   03/31/23 1653 03/31/23 1800 04/01/23 0500  Weight: 90.7 kg 90.7 kg 91 kg    Exam:  General:  Appears calm and comfortable and is in NAD Eyes:  EOMI, normal lids, iris ENT:  grossly normal hearing, lips & tongue, mmm Neck:  no LAD, masses or thyromegaly Cardiovascular:  RRR, no m/r/g. No LE edema.  Respiratory:   CTA bilaterally with no wheezes/rales/rhonchi.  Normal respiratory effort. Abdomen:  soft, mild diffuse TTP, ND Skin:  no rash or induration seen on limited exam Musculoskeletal:  grossly normal tone BUE/BLE, good ROM, no bony abnormality Psychiatric:  grossly normal mood and affect, speech fluent and appropriate, AOx3 Neurologic:  CN 2-12 grossly intact, moves all extremities in coordinated fashion, sensation intact  Data Reviewed: I have reviewed the patient's lab results since admission.  Pertinent labs for today include:   K+ 3.2  WBC 6.5 Hgb 8.9  Platelets 140   Family Communication: None present; we spoke with his wife by telephone during the encounter  Disposition: Status is: Inpatient Admit - It is my clinical opinion that admission to INPATIENT is reasonable and necessary because of the  expectation that this patient will require hospital care that crosses at least 2 midnights to treat this condition based on the medical complexity of the problems presented.  Given the aforementioned information, the predictability of an adverse outcome is felt to be significant.      Time spent: 35 minutes  Unresulted Labs (From admission, onward)     Start     Ordered   04/03/23 0500  Magnesium  Tomorrow morning,   R       Question:  Specimen collection method  Answer:  Lab=Lab collect   04/02/23 0755   04/03/23 0500  Phosphorus  Tomorrow morning,   R       Question:  Specimen collection method  Answer:  Lab=Lab collect   04/02/23 0755   04/03/23 0500  Basic metabolic panel  Tomorrow morning,   R       Question:  Specimen collection method  Answer:  Lab=Lab collect   04/02/23 0755   04/03/23 0500  CBC with Differential/Platelet  Tomorrow morning,   R       Question:  Specimen collection method  Answer:  Lab=Lab collect   04/02/23 1541             Author: Jonah Blue, MD 04/02/2023 3:41 PM  For on call review [email or web address].

## 2023-04-03 DIAGNOSIS — A045 Campylobacter enteritis: Secondary | ICD-10-CM | POA: Diagnosis not present

## 2023-04-03 LAB — CBC WITH DIFFERENTIAL/PLATELET
Abs Immature Granulocytes: 0.07 10*3/uL (ref 0.00–0.07)
Basophils Absolute: 0 10*3/uL (ref 0.0–0.1)
Basophils Relative: 0 %
Eosinophils Absolute: 0.2 10*3/uL (ref 0.0–0.5)
Eosinophils Relative: 2 %
HCT: 26.8 % — ABNORMAL LOW (ref 39.0–52.0)
Hemoglobin: 8.8 g/dL — ABNORMAL LOW (ref 13.0–17.0)
Immature Granulocytes: 1 %
Lymphocytes Relative: 31 %
Lymphs Abs: 2.4 10*3/uL (ref 0.7–4.0)
MCH: 33.3 pg (ref 26.0–34.0)
MCHC: 32.8 g/dL (ref 30.0–36.0)
MCV: 101.5 fL — ABNORMAL HIGH (ref 80.0–100.0)
Monocytes Absolute: 0.5 10*3/uL (ref 0.1–1.0)
Monocytes Relative: 6 %
Neutro Abs: 4.6 10*3/uL (ref 1.7–7.7)
Neutrophils Relative %: 60 %
Platelets: 161 10*3/uL (ref 150–400)
RBC: 2.64 MIL/uL — ABNORMAL LOW (ref 4.22–5.81)
RDW: 13.6 % (ref 11.5–15.5)
WBC: 7.7 10*3/uL (ref 4.0–10.5)
nRBC: 0 % (ref 0.0–0.2)

## 2023-04-03 LAB — BASIC METABOLIC PANEL
Anion gap: 5 (ref 5–15)
BUN: 5 mg/dL — ABNORMAL LOW (ref 8–23)
CO2: 24 mmol/L (ref 22–32)
Calcium: 8.2 mg/dL — ABNORMAL LOW (ref 8.9–10.3)
Chloride: 110 mmol/L (ref 98–111)
Creatinine, Ser: 0.81 mg/dL (ref 0.61–1.24)
GFR, Estimated: >60 mL/min (ref >60)
Glucose, Bld: 97 mg/dL (ref 70–99)
Potassium: 3.7 mmol/L (ref 3.5–5.1)
Sodium: 139 mmol/L (ref 135–145)

## 2023-04-03 LAB — PHOSPHORUS: Phosphorus: 3.2 mg/dL (ref 2.5–4.6)

## 2023-04-03 LAB — MAGNESIUM: Magnesium: 1.8 mg/dL (ref 1.7–2.4)

## 2023-04-03 MED ORDER — AZITHROMYCIN 500 MG PO TABS: 500.0000 mg | ORAL_TABLET | Freq: Every day | ORAL | 0 refills | Status: AC

## 2023-04-03 MED ORDER — ADULT MULTIVITAMIN W/MINERALS CH
1.0000 | ORAL_TABLET | Freq: Every day | ORAL | Status: DC
  Administered 2023-04-03: 1 via ORAL
  Filled 2023-04-03: qty 1

## 2023-04-03 MED ORDER — MIRTAZAPINE 15 MG PO TABS: 15.0000 mg | ORAL_TABLET | Freq: Every evening | ORAL | Status: DC | PRN

## 2023-04-03 NOTE — Plan of Care (Signed)

## 2023-04-03 NOTE — Consult Note (Signed)
PHARMACY CONSULT NOTE - ELECTROLYTES  Pharmacy Consult for Electrolyte Monitoring and Replacement   Recent Labs: Height: 5\' 8"  (172.7 cm) Weight: 91 kg (200 lb 9.9 oz) IBW/kg (Calculated) : 68.4 Estimated Creatinine Clearance: 83.6 mL/min (by C-G formula based on SCr of 0.81 mg/dL). Potassium (mmol/L)  Date Value  04/03/2023 3.7  10/11/2014 3.3 (L)   Magnesium (mg/dL)  Date Value  19/14/7829 1.8  10/11/2014 1.9   Calcium (mg/dL)  Date Value  56/21/3086 8.2 (L)   Calcium, Total (mg/dL)  Date Value  57/84/6962 8.8 (L)   Albumin (g/dL)  Date Value  95/28/4132 3.4 (L)  05/18/2017 4.3  10/11/2014 4.1   Phosphorus (mg/dL)  Date Value  44/07/270 3.2   Sodium (mmol/L)  Date Value  04/03/2023 139  03/11/2021 143  10/11/2014 139   Corrected Ca: 8.68 mg/dL  Assessment  [name] is a 77 y.o. male presenting with diarrhea. 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 Pharmacy has been consulted to monitor and replace electrolytes.  Diet: regular  Goal of Therapy: Electrolytes WNL  Plan:  No replacement currently indicated Check BMP, Mg, Phos with AM labs  Thank you for allowing pharmacy to be a part of this patient's care.  Bettey Costa, PharmD Clinical Pharmacist 04/03/2023 9:29 AM

## 2023-04-03 NOTE — TOC Transition Note (Signed)
Transition of Care [facility]) - CM/SW Discharge Note   Patient Details  Name: KAELYN YOUNGDAHL MRN: [id number] Date of Birth: 10-Oct-1945  Transition of Care [facility]) CM/SW Contact:  Hetty Ely, RN Phone Number: 04/03/2023, 4:29 PM   Clinical Narrative: Patient to discharge home/self care, patient spoke with wife who will transport home, a 45 min. Drive. No TOC needs identified.      Final next level of care: Home/Self Care Barriers to Discharge: Barriers Resolved   Patient Goals and CMS Choice      Discharge Placement                    Name of family member notified: Spouse per patient Patient and family notified of of transfer: 04/03/23  Discharge Plan and Services Additional resources added to the After Visit Summary for                  DME Arranged: N/A DME Agency: NA         HH Agency: NA        Social Determinants of Health (SDOH) Interventions SDOH Screenings   Food Insecurity: No Food Insecurity (03/31/2023)  Housing: Low Risk  (03/31/2023)  Transportation Needs: No Transportation Needs (03/31/2023)  Utilities: Not At Risk (03/31/2023)  Depression (PHQ2-9): Low Risk  (01/09/2023)  Financial Resource Strain: Low Risk  (09/12/2022)  Physical Activity: Unknown (09/12/2022)  Social Connections: Unknown (09/12/2022)  Stress: No Stress Concern Present (09/12/2022)  Tobacco Use: Medium Risk (03/31/2023)     Readmission Risk Interventions    10/19/2021   10:18 AM  Readmission Risk Prevention Plan  Transportation Screening Complete  PCP or Specialist Appt within 3-5 Days Complete  HRI or Home Care Consult Complete  Social Work Consult for Recovery Care Planning/Counseling Complete  Palliative Care Screening Not Applicable  Medication Review Oceanographer) Complete

## 2023-04-03 NOTE — Discharge Summary (Signed)
Physician Discharge Summary   Patient: [name] MRN: [id number] DOB: [birth year]  Admit date:     03/31/2023  Discharge date: 04/03/23  Discharge Physician: Enedina Finner   PCP: Glori Luis, MD   Recommendations at discharge:   follow-up PCP in 1 to 2 weeks  Discharge Diagnoses: Principal Problem:   Campylobacter gastroenteritis Active Problems:   Hypotension   CAD (coronary artery disease)   Essential hypertension   Paroxysmal atrial fibrillation (HCC)   Chronic diastolic CHF (congestive heart failure) (HCC)   CLL (chronic lymphocytic leukemia) (HCC)   COPD (chronic obstructive pulmonary disease) (HCC)   Hyperlipidemia   OSA on CPAP   Class 1 obesity due to excess calories with body mass index (BMI) of 30.0 to 30.9 in adult   Campylobacter enteritis   76yo with h/o recurrent stage 4 CLL on Alcabrutinib, chronic HFrEF, afib on Eliquis, CAD s/p CABG, pacemaker placement, CVA, HTN, and HLD who presented on 9/10 with diarrhea.  He was previously diagnosed with COVID on 8/20 and treated with doxycycline and prednisone.  GI pathogen panel is position for Campylobacter and he was started on Azithromycin.   Assessment and Plan Campylobacter gastroenteritis Patient has been experiencing watery diarrhea for 1 week in addition to nausea/vomiting, with GI panel positive for Campylobacter Given immunocompromised state in the setting of CLL on monoclonal antibody, will treat with antibiotics Treat with Azithromycin 500 mg oral daily x 7 days Zofran as needed for nausea Diarrhea slowed down. Tolerating po soft diet  Hypotension pt presented to his oncologist office on 9/10 with notable hypotension in the setting of hypovolemia due to gastroenteritis.   He has improved with IV fluid resuscitation BP stable   CAD (coronary artery disease) Compensated at this time   Essential hypertension He does not appear to be taking medications for this issue at this time  He was  hypotensive on presentation and so meds would be held regardless   Chronic diastolic CHF (congestive heart failure) (HCC) Patient was hypovolemic on admission secondary to gastroenteritis.   Patient is not on any diuretics at home   Paroxysmal atrial fibrillation (HCC) Rate controlled with amiodarone Continue Eliquis   CLL (chronic lymphocytic leukemia) (HCC) Patient has a history of recurrent stage IV CLL currently on Calquence Resume home Calquence on discharge   COPD (chronic obstructive pulmonary disease) (HCC) DuoNebs as needed Continue Spiriva, Symbicort (Dulera per formulary)   Class 1 obesity due to excess calories with body mass index (BMI) of 30.0 to 30.9 in adult Body mass index is 30.5 kg/m.Marland Kitchen  Weight loss should be encouraged Outpatient PCP/bariatric medicine f/u encouraged    OSA on CPAP Continue CPAP   Hyperlipidemia Continue Zetia Also on Repatha as an outpatient  Overall doing well. Patient agreeable for discharge to home with outpatient follow-up PCP and oncology. No family at bedside today.     Pain control - [employer] Controlled Substance Reporting System database was reviewed. and patient was instructed, not to drive, operate heavy machinery, perform activities at heights, swimming or participation in water activities or provide baby-sitting services while on Pain, Sleep and Anxiety Medications; until their outpatient Physician has advised to do so again. Also recommended to not to take more than prescribed Pain, Sleep and Anxiety Medications.  Diet recommendation:  Discharge Diet Orders (From admission, onward)     Start     Ordered   04/03/23 0000  Diet - low sodium heart healthy  04/03/23 1443            DISCHARGE MEDICATION: Allergies as of 04/03/2023       Reactions   Atorvastatin Other (See Comments)   Muscle aching   Augmentin [amoxicillin-pot Clavulanate]    C diff        Medication List     STOP taking these  medications    ondansetron 8 MG disintegrating tablet Commonly known as: ZOFRAN-ODT   pantoprazole 40 MG tablet Commonly known as: PROTONIX   predniSONE 20 MG tablet Commonly known as: DELTASONE       TAKE these medications    acetaminophen 325 MG tablet Commonly known as: TYLENOL Take 1-2 tablets (325-650 mg total) by mouth every 4 (four) hours as needed for mild pain.   acyclovir 400 MG tablet Commonly known as: ZOVIRAX TAKE 1 TABLET(400 MG) BY MOUTH TWICE DAILY What changed:  how much to take when to take this   albuterol 108 (90 Base) MCG/ACT inhaler Commonly known as: VENTOLIN HFA Inhale 2 puffs into the lungs every 6 (six) hours as needed for wheezing or shortness of breath.   amiodarone 200 MG tablet Commonly known as: PACERONE Take 1/2 tablet (100 mg) by mouth twice daily 5 days a week What changed:  how much to take how to take this when to take this additional instructions   apixaban 5 MG Tabs tablet Commonly known as: ELIQUIS Take 5 mg by mouth 2 (two) times daily.   azelastine 0.1 % nasal spray Commonly known as: ASTELIN Place 2 sprays into both nostrils 2 (two) times daily. Use in each nostril as directed   azithromycin 500 MG tablet Commonly known as: ZITHROMAX Take 1 tablet (500 mg total) by mouth daily for 3 doses. Start taking on: April 04, 2023   budesonide-formoterol 160-4.5 MCG/ACT inhaler Commonly known as: SYMBICORT Inhale 2 puffs into the lungs 2 (two) times daily.   Calquence 100 MG tablet Generic drug: acalabrutinib maleate Take 1 tablet (100 mg total) by mouth 2 (two) times daily.   cetirizine 10 MG tablet Commonly known as: ZYRTEC Take 10 mg by mouth daily as needed for allergies.   CoQ10 200 MG Caps Take 200 mg by mouth daily.   ezetimibe 10 MG tablet Commonly known as: ZETIA Take 1 tablet (10 mg total) by mouth daily.   ferrous sulfate 325 (65 FE) MG EC tablet Take 325 mg by mouth 3 (three) times daily with  meals.   Krill Oil 350 MG Caps Take 350 mg by mouth daily as needed (Heart).   mirtazapine 15 MG tablet Commonly known as: REMERON Take 15 mg by mouth at bedtime as needed (for panic associated with PTSD).   multivitamin with minerals Tabs tablet Take 1 tablet by mouth daily.   Repatha SureClick 140 MG/ML Soaj Generic drug: Evolocumab Inject 140 mg into the skin every 14 (fourteen) days.   tamsulosin 0.4 MG Caps capsule Commonly known as: FLOMAX Take 1 capsule (0.4 mg total) by mouth daily. What changed: when to take this   Tiotropium Bromide Monohydrate 2.5 MCG/ACT Aers Inhale 2 puffs into the lungs at bedtime. Spiriva   traMADol 50 MG tablet Commonly known as: ULTRAM Take 1 tablet (50 mg total) by mouth every 12 (twelve) hours as needed.        Follow-up Information     Glori Luis, MD. Schedule an appointment as soon as possible for a visit in 1 week(s).   Specialty: Family Medicine Why: Hospital  follow-up Contact information: [address] [place] [address] [phone]                 Condition at discharge: fair  The results of significant diagnostics from this hospitalization (including imaging, microbiology, ancillary and laboratory) are listed below for reference.   Imaging Studies: No results found.  Microbiology: Results for orders placed or performed in visit on 03/31/23  Gastrointestinal Panel by PCR , Stool     Status: Abnormal   Collection Time: 03/31/23  1:52 PM   Specimen: Stool  Result Value Ref Range Status   Campylobacter species DETECTED (A) NOT DETECTED Final    Comment: RESULT CALLED TO, READ BACK BY AND VERIFIED WITH: BRENDA ELLINGTON AT 1638 ON 03/31/23 BY SS    Plesimonas shigelloides NOT DETECTED NOT DETECTED Final   Salmonella species NOT DETECTED NOT DETECTED Final   Yersinia enterocolitica NOT DETECTED NOT DETECTED Final   Vibrio species NOT DETECTED NOT DETECTED Final   Vibrio cholerae NOT  DETECTED NOT DETECTED Final   Enteroaggregative E coli (EAEC) NOT DETECTED NOT DETECTED Final   Enteropathogenic E coli (EPEC) NOT DETECTED NOT DETECTED Final   Enterotoxigenic E coli (ETEC) NOT DETECTED NOT DETECTED Final   Shiga like toxin producing E coli (STEC) NOT DETECTED NOT DETECTED Final   Shigella/Enteroinvasive E coli (EIEC) NOT DETECTED NOT DETECTED Final   Cryptosporidium NOT DETECTED NOT DETECTED Final   Cyclospora cayetanensis NOT DETECTED NOT DETECTED Final   Entamoeba histolytica NOT DETECTED NOT DETECTED Final   Giardia lamblia NOT DETECTED NOT DETECTED Final   Adenovirus F40/41 NOT DETECTED NOT DETECTED Final   Astrovirus NOT DETECTED NOT DETECTED Final   Norovirus GI/GII NOT DETECTED NOT DETECTED Final   Rotavirus A NOT DETECTED NOT DETECTED Final   Sapovirus (I, II, IV, and V) NOT DETECTED NOT DETECTED Final    Comment: Performed at [facility], [address] [place], [place] [address]  C difficile quick screen w PCR reflex     Status: None   Collection Time: 03/31/23  1:52 PM   Specimen: STOOL  Result Value Ref Range Status   C Diff antigen NEGATIVE NEGATIVE Final   C Diff toxin NEGATIVE NEGATIVE Final   C Diff interpretation No C. difficile detected.  Final    Comment: Performed at [facility], [address] [place], [place] [address]   *Note: Due to a large number of results and/or encounters for the requested time period, some results have not been displayed. A complete set of results can be found in Results Review.    Labs: CBC: Recent Labs  Lab 03/31/23 1240 04/01/23 0433 04/02/23 0346 04/03/23 0343  WBC 6.8 7.9 6.5 7.7  NEUTROABS 2.7  --  2.5 4.6  HGB 9.7* 8.5* 8.9* 8.8*  HCT 30.3* 26.5* 27.6* 26.8*  MCV 104.5* 102.3* 102.6* 101.5*  PLT 156 154 140* 161   Basic Metabolic Panel: Recent Labs  Lab 03/31/23 1240 03/31/23 1241 04/01/23 0433 04/02/23 0346 04/03/23 0343  NA  --  138 139 138 139  K  --  2.9* 3.2* 3.2*  3.7  CL  --  107 108 109 110  CO2  --  23 23 23 24   GLUCOSE  --  117* 101* 102* 97  BUN  --  16 12 8  5*  CREATININE  --  1.21 0.92 0.86 0.81  CALCIUM  --  8.4* 7.9* 8.1* 8.2*  MG 2.1  --   --  2.1 1.8  PHOS  --   --   --  3.3 3.2   Liver Function Tests: Recent Labs  Lab 03/31/23 1241  AST 13*  ALT 14  ALKPHOS 76  BILITOT 0.7  PROT 6.5  ALBUMIN 3.4*   CBG: No results for input(s): "GLUCAP" in the last 168 hours.  Discharge time spent: greater than 30 minutes.  Signed: Enedina Finner, MD [facility] 04/03/2023

## 2023-04-03 NOTE — Care Management Important Message (Signed)
Important Message  Patient Details  Name: [name] MRN: [id number] Date of Birth: February 06, 1946   Medicare Important Message Given:  N/A - LOS <3 / Initial given by admissions     Olegario Messier [name] 04/03/2023, 9:41 AM

## 2023-04-06 ENCOUNTER — Telehealth: Payer: Self-pay

## 2023-04-06 NOTE — Transitions of Care (Post Inpatient/ED Visit) (Unsigned)
04/06/2023  Name: [name] MRN: [id number] DOB: [birth year]  Today's TOC FU Call Status: Today's TOC FU Call Status:: Unsuccessful Call (1st Attempt) Unsuccessful Call (1st Attempt) Date: 04/06/23  Attempted to reach the patient regarding the most recent Inpatient/ED visit.  Follow Up Plan: Additional outreach attempts will be made to reach the patient to complete the Transitions of Care (Post Inpatient/ED visit) call.   Signature Karena Addison, LPN [facility] Nurse Health Advisor Direct Dial [phone]

## 2023-04-07 ENCOUNTER — Encounter: Payer: Medicare HMO | Admitting: Neurosurgery

## 2023-04-07 NOTE — Transitions of Care (Post Inpatient/ED Visit) (Signed)
04/07/2023  Name: [name] MRN: [id number] DOB: 09-Sep-1945  Today's TOC FU Call Status: Today's TOC FU Call Status:: Successful TOC FU Call Completed Unsuccessful Call (1st Attempt) Date: 04/06/23 [facility] FU Call Complete Date: 04/07/23 Patient's Name and Date of Birth confirmed.  Transition Care Management Follow-up Telephone Call Date of Discharge: 04/03/23 Discharge Facility: [facility]) Type of Discharge: Inpatient Admission Primary Inpatient Discharge Diagnosis:: hypotension How have you been since you were released from the hospital?: Better Any questions or concerns?: No  Items Reviewed: Did you receive and understand the discharge instructions provided?: Yes Medications obtained,verified, and reconciled?: Yes (Medications Reviewed) Any new allergies since your discharge?: No Dietary orders reviewed?: Yes Do you have support at home?: Yes People in Home: spouse  Medications Reviewed Today: Medications Reviewed Today     Reviewed by Karena Addison, LPN (Licensed Practical Nurse) on 04/07/23 at 1644  Med List Status: <None>   Medication Order Taking? Sig Documenting Provider Last Dose Status Informant  acalabrutinib maleate (CALQUENCE) 100 MG tablet [id number] No Take 1 tablet (100 mg total) by mouth 2 (two) times daily. Earna Coder, MD 03/31/2023 Active            Med Note Ronney Asters, HEATHER J   Tue Mar 31, 2023  1:07 PM) Last dose taking this morning  acetaminophen (TYLENOL) 325 MG tablet [id number] No Take 1-2 tablets (325-650 mg total) by mouth every 4 (four) hours as needed for mild pain. Setzer, Lynnell Jude, PA-C prn Active   acyclovir (ZOVIRAX) 400 MG tablet [id number]  TAKE 1 TABLET(400 MG) BY MOUTH TWICE DAILY  Patient taking differently: 400 mg 2 (two) times daily. TAKE 1 TABLET(400 MG) BY MOUTH TWICE DAILY   Earna Coder, MD  Active Spouse/Significant Other  albuterol (VENTOLIN HFA) 108 (90 Base) MCG/ACT inhaler  [id number] No Inhale 2 puffs into the lungs every 6 (six) hours as needed for wheezing or shortness of breath. Setzer, Lynnell Jude, PA-C prn Active   amiodarone (PACERONE) 200 MG tablet [id number] No Take 1/2 tablet (100 mg) by mouth twice daily 5 days a week  Patient taking differently: Take 100 mg by mouth 2 (two) times daily. Take 1/2 tablet (100 mg) by mouth twice daily   Duke Salvia, MD 03/31/2023 Active   apixaban (ELIQUIS) 5 MG TABS tablet [id number] No Take 5 mg by mouth 2 (two) times daily. [provider] 03/31/2023 Active Spouse/Significant Other  azelastine (ASTELIN) 0.1 % nasal spray [id number] No Place 2 sprays into both nostrils 2 (two) times daily. Use in each nostril as directed Bethanie Dicker, NP 03/31/2023 Active   azithromycin (ZITHROMAX) 500 MG tablet [id number]  Take 1 tablet (500 mg total) by mouth daily for 3 doses. Enedina Finner, MD  Active   budesonide-formoterol [facility]) 160-4.5 MCG/ACT inhaler [id number] No Inhale 2 puffs into the lungs 2 (two) times daily. Merwyn Katos, MD 03/31/2023 Active Spouse/Significant Other  cetirizine (ZYRTEC) 10 MG tablet [id number] No Take 10 mg by mouth daily as needed for allergies.  [provider] prn Active Spouse/Significant Other  Coenzyme Q10 (COQ10) 200 MG CAPS [id number] No Take 200 mg by mouth daily. [provider] 03/31/2023 Active Spouse/Significant Other  Evolocumab (REPATHA SURECLICK) 140 MG/ML SOAJ [id number] No Inject 140 mg into the skin every 14 (fourteen) days. Antonieta Iba, MD 03/23/2023 Active   ezetimibe (ZETIA) 10 MG tablet [id number] No Take 1 tablet (10 mg total) by mouth daily. Antonieta Iba, MD 03/31/2023 Active  Spouse/Significant Other  ferrous sulfate 325 (65 FE) MG EC tablet [id number] No Take 325 mg by mouth 3 (three) times daily with meals. [provider] 03/31/2023 Active   Krill Oil 350 MG CAPS [id number] No Take 350 mg by mouth daily as needed (Heart). [provider]  03/31/2023 Active Spouse/Significant Other           Med Note Lelon Perla, DONI H   Wed Jan 06, 2018 10:21 AM)    mirtazapine (REMERON) 15 MG tablet [id number] No Take 15 mg by mouth at bedtime as needed (for panic associated with PTSD). [provider] prn Active Spouse/Significant Other  Multiple Vitamin (MULTIVITAMIN WITH MINERALS) TABS tablet [id number] No Take 1 tablet by mouth daily. [provider] 03/31/2023 Active Spouse/Significant Other  tamsulosin (FLOMAX) 0.4 MG CAPS capsule [id number] No Take 1 capsule (0.4 mg total) by mouth daily.  Patient taking differently: Take 0.4 mg by mouth at bedtime.   Glori Luis, MD 03/31/2023 Active   Tiotropium Bromide Monohydrate 2.5 MCG/ACT AERS [id number] No Inhale 2 puffs into the lungs at bedtime. Spiriva [provider] 03/31/2023 Active Spouse/Significant Other  traMADol (ULTRAM) 50 MG tablet [id number] No Take 1 tablet (50 mg total) by mouth every 12 (twelve) hours as needed. Earna Coder, MD prn Active   Med List Note Remi Haggard, RPH-CPP 08/28/22 1057): Pt also uses the [place] in [place], [place].   Calquence filled at [facility] (Specialty)            Home Care and Equipment/Supplies: Were Home Health Services Ordered?: [place]  Functional Questionnaire: Do you need assistance with bathing/showering or dressing?: No Do you need assistance with meal preparation?: No Do you need assistance with eating?: No Do you have difficulty maintaining continence: No Do you need assistance with getting out of bed/getting out of a chair/moving?: No Do you have difficulty managing or taking your medications?: No  Follow up appointments reviewed: PCP Follow-up appointment confirmed?: Yes Date of PCP follow-up appointment?: 04/03/23 Follow-up Provider: [facility] Follow-up appointment confirmed?: NA Do you need transportation to your follow-up appointment?: No Do you understand care  options if your condition(s) worsen?: Yes-patient verbalized understanding    SIGNATURE Karena Addison, LPN [facility] Nurse Health Advisor Direct Dial [phone]

## 2023-04-08 ENCOUNTER — Encounter
Admission: RE | Admit: 2023-04-08 | Discharge: 2023-04-08 | Disposition: A | Discharge status: Home / Self Care | Payer: Medicare HMO | Source: Ambulatory Visit | Attending: Acute Care | Admitting: Acute Care

## 2023-04-08 VITALS — BP 148/71 | HR 67 | Ht 68.0 in | Wt 200.4 lb

## 2023-04-08 DIAGNOSIS — Z01812 Encounter for preprocedural laboratory examination: Secondary | ICD-10-CM | POA: Insufficient documentation

## 2023-04-08 DIAGNOSIS — Z01818 Encounter for other preprocedural examination: Secondary | ICD-10-CM

## 2023-04-08 HISTORY — DX: Personal history of nicotine dependence: Z87.891

## 2023-04-08 HISTORY — DX: Hypokalemia: E87.6

## 2023-04-08 HISTORY — DX: Contact with and (suspected) exposure to other war theater: Z77.39

## 2023-04-08 HISTORY — DX: Contact with and (suspected) exposure to other hazardous, chiefly nonmedicinal, chemicals: Z77.098

## 2023-04-08 LAB — SURGICAL PCR SCREEN
MRSA, PCR: NEGATIVE
Staphylococcus aureus: NEGATIVE

## 2023-04-08 LAB — TYPE AND SCREEN
ABO/RH(D): O NEG
Antibody Screen: NEGATIVE

## 2023-04-08 NOTE — Telephone Encounter (Signed)
Reviewed

## 2023-04-08 NOTE — Patient Instructions (Addendum)
Your procedure is scheduled on:04-21-23 Tuesday Report to the Registration Desk on the 1st floor of the Medical Mall.Then proceed to the 2nd floor Surgery Desk To find out your arrival time, please call [phone] between 1PM - 3PM on:04-20-23 Monday If your arrival time is 6:00 am, do not arrive before that time as the Medical Mall entrance doors do not open until 6:00 am.  REMEMBER: Instructions that are not followed completely may result in serious medical risk, up to and including death; or upon the discretion of your surgeon and anesthesiologist your surgery may need to be rescheduled.  Do not eat food after midnight the night before surgery.  No gum chewing or hard candies.  You may however, drink CLEAR liquids up to 2 hours before you are scheduled to arrive for your surgery. Do not drink anything within 2 hours of your scheduled arrival time.  Clear liquids include: - water  - apple juice without pulp - gatorade (not RED colors) - black coffee or tea (Do NOT add milk or creamers to the coffee or tea) Do NOT drink anything that is not on this list.  One week prior to surgery: Stop Anti-inflammatories (NSAIDS) such as Advil, Aleve, Ibuprofen, Motrin, Naproxen, Naprosyn and Aspirin based products such as Excedrin, Goody's Powder, BC Powder.You may however, take Tylenol/Tramadol if needed for pain up until the day of surgery. Stop ANY OVER THE COUNTER supplements/vitamins 7 days prior to surgery (Multivitamin, B12, D3, Apple Cider Vinegar, Co Q10) Continue your Iron Pill up until the day prior to surgery   Continue taking all prescribed medications with the exception of the following: -apixaban (ELIQUIS)-Stop 3 days prior to surgery-Last dose will be on 04-17-23 Friday -acalabrutinib maleate (CALQUENCE)-Stop 7 days prior to surgery-Last dose will be on 04-13-23 Monday  TAKE ONLY THESE MEDICATIONS THE MORNING OF SURGERY WITH A SIP OF WATER: -pantoprazole (PROTONIX)-Take one the night  before surgery and one the morning of surgery -amiodarone (PACERONE)  -ezetimibe (ZETIA)    Use your [place] and Albuterol Inhalers the day of surgery and bring your Albuterol Inhaler to the hospital   No Alcohol for 24 hours before or after surgery.  No Smoking including e-cigarettes for 24 hours before surgery.  No chewable tobacco products for at least 6 hours before surgery.  No nicotine patches on the day of surgery.  Do not use any "recreational" drugs for at least a week (preferably 2 weeks) before your surgery.  Please be advised that the combination of cocaine and anesthesia may have negative outcomes, up to and including death. If you test positive for cocaine, your surgery will be cancelled.  On the morning of surgery brush your teeth with toothpaste and water, you may rinse your mouth with mouthwash if you wish. Do not swallow any toothpaste or mouthwash.  Use CHG Soap as directed on instruction sheet.  Do not wear jewelry, make-up, hairpins, clips or nail polish.  For welded (permanent) jewelry: bracelets, anklets, waist bands, etc.  Please have this removed prior to surgery.  If it is not removed, there is a chance that hospital personnel will need to cut it off on the day of surgery.  Do not wear lotions, powders, or perfumes.   Do not shave body hair from the neck down 48 hours before surgery.  Contact lenses, hearing aids and dentures may not be worn into surgery.  Do not bring valuables to the hospital. [facility] is not responsible for any missing/lost belongings or valuables.  Bring your C-PAP to the hospital   Notify your doctor if there is any change in your medical condition (cold, fever, infection).  Wear comfortable clothing (specific to your surgery type) to the hospital.  After surgery, you can help prevent lung complications by doing breathing exercises.  Take deep breaths and cough every 1-2 hours. Your doctor may order a device called  an Incentive Spirometer to help you take deep breaths. When coughing or sneezing, hold a pillow firmly against your incision with both hands. This is called "splinting." Doing this helps protect your incision. It also decreases belly discomfort.  If you are being admitted to the hospital overnight, leave your suitcase in the car. After surgery it may be brought to your room.  In case of increased patient census, it may be necessary for you, the patient, to continue your postoperative care in the Same Day Surgery department.  If you are being discharged the day of surgery, you will not be allowed to drive home. You will need a responsible individual to drive you home and stay with you for 24 hours after surgery.   If you are taking public transportation, you will need to have a responsible individual with you.  Please call the Pre-admissions Testing Dept. at [phone] if you have any questions about these instructions.  Surgery Visitation Policy:  Patients having surgery or a procedure may have two visitors.  Children under the age of 5 must have an adult with them who is not the patient.  Inpatient Visitation:    Visiting hours are 7 a.m. to 8 p.m. Up to four visitors are allowed at one time in a patient room. The visitors may rotate out with other people during the day.  One visitor age 41 or older may stay with the patient overnight and must be in the room by 8 p.m.    Pre-operative 5 CHG Bath Instructions   You can play a key role in reducing the risk of infection after surgery. Your skin needs to be as free of germs as possible. You can reduce the number of germs on your skin by washing with CHG (chlorhexidine gluconate) soap before surgery. CHG is an antiseptic soap that kills germs and continues to kill germs even after washing.   DO NOT use if you have an allergy to chlorhexidine/CHG or antibacterial soaps. If your skin becomes reddened or irritated, stop using the CHG and  notify one of our RNs at [phone].   Please shower with the CHG soap starting 4 days before surgery using the following schedule:     Please keep in mind the following:  DO NOT shave, including legs and underarms, starting the day of your first shower.   You may shave your face at any point before/day of surgery.  Place clean sheets on your bed the day you start using CHG soap. Use a clean washcloth (not used since being washed) for each shower. DO NOT sleep with pets once you start using the CHG.   CHG Shower Instructions:  If you choose to wash your hair and private area, wash first with your normal shampoo/soap.  After you use shampoo/soap, rinse your hair and body thoroughly to remove shampoo/soap residue.  Turn the water OFF and apply about 3 tablespoons (45 ml) of CHG soap to a CLEAN washcloth.  Apply CHG soap ONLY FROM YOUR NECK DOWN TO YOUR TOES (washing for 3-5 minutes)  DO NOT use CHG soap on face, private areas, open  wounds, or sores.  Pay special attention to the area where your surgery is being performed.  If you are having back surgery, having someone wash your back for you may be helpful. Wait 2 minutes after CHG soap is applied, then you may rinse off the CHG soap.  Pat dry with a clean towel  Put on clean clothes/pajamas   If you choose to wear lotion, please use ONLY the CHG-compatible lotions on the back of this paper.     Additional instructions for the day of surgery: DO NOT APPLY any lotions, deodorants, cologne, or perfumes.   Put on clean/comfortable clothes.  Brush your teeth.  Ask your nurse before applying any prescription medications to the skin.      CHG Compatible Lotions   Aveeno Moisturizing lotion  Cetaphil Moisturizing Cream  Cetaphil Moisturizing Lotion  Clairol Herbal Essence Moisturizing Lotion, Dry Skin  Clairol Herbal Essence Moisturizing Lotion, Extra Dry Skin  Clairol Herbal Essence Moisturizing Lotion, Normal Skin  Curel Age  Defying Therapeutic Moisturizing Lotion with Alpha Hydroxy  Curel Extreme Care Body Lotion  Curel Soothing Hands Moisturizing Hand Lotion  Curel Therapeutic Moisturizing Cream, Fragrance-Free  Curel Therapeutic Moisturizing Lotion, Fragrance-Free  Curel Therapeutic Moisturizing Lotion, Original Formula  Eucerin Daily Replenishing Lotion  Eucerin Dry Skin Therapy Plus Alpha Hydroxy Crme  Eucerin Dry Skin Therapy Plus Alpha Hydroxy Lotion  Eucerin Original Crme  Eucerin Original Lotion  Eucerin Plus Crme Eucerin Plus Lotion  Eucerin TriLipid Replenishing Lotion  Keri Anti-Bacterial Hand Lotion  Keri Deep Conditioning Original Lotion Dry Skin Formula Softly Scented  Keri Deep Conditioning Original Lotion, Fragrance Free Sensitive Skin Formula  Keri Lotion Fast Absorbing Fragrance Free Sensitive Skin Formula  Keri Lotion Fast Absorbing Softly Scented Dry Skin Formula  Keri Original Lotion  Keri Skin Renewal Lotion Keri Silky Smooth Lotion  Keri Silky Smooth Sensitive Skin Lotion  Nivea Body Creamy Conditioning Oil  Nivea Body Extra Enriched Teacher, adult education Moisturizing Lotion Nivea Crme  Nivea Skin Firming Lotion  NutraDerm 30 Skin Lotion  NutraDerm Skin Lotion  NutraDerm Therapeutic Skin Cream  NutraDerm Therapeutic Skin Lotion  ProShield Protective Hand Cream  Provon moisturizing lotion

## 2023-04-10 ENCOUNTER — Ambulatory Visit (INDEPENDENT_AMBULATORY_CARE_PROVIDER_SITE_OTHER): Payer: Medicare HMO | Admitting: Family Medicine

## 2023-04-10 ENCOUNTER — Encounter: Payer: Self-pay | Admitting: Family Medicine

## 2023-04-10 ENCOUNTER — Other Ambulatory Visit (HOSPITAL_COMMUNITY): Payer: Self-pay

## 2023-04-10 VITALS — BP 122/74 | HR 80 | Temp 98.0°F | Ht 68.0 in | Wt 241.4 lb

## 2023-04-10 DIAGNOSIS — U071 COVID-19: Secondary | ICD-10-CM

## 2023-04-10 DIAGNOSIS — A045 Campylobacter enteritis: Secondary | ICD-10-CM

## 2023-04-10 DIAGNOSIS — Z23 Encounter for immunization: Secondary | ICD-10-CM

## 2023-04-10 LAB — CBC WITH DIFFERENTIAL/PLATELET
Basophils Absolute: 0.1 10*3/uL (ref 0.0–0.1)
Basophils Relative: 0.8 % (ref 0.0–3.0)
Eosinophils Absolute: 0.2 10*3/uL (ref 0.0–0.7)
Eosinophils Relative: 1.3 % (ref 0.0–5.0)
HCT: 31.7 % — ABNORMAL LOW (ref 39.0–52.0)
Hemoglobin: 10.3 g/dL — ABNORMAL LOW (ref 13.0–17.0)
Lymphocytes Relative: 39.3 % (ref 12.0–46.0)
Lymphs Abs: 5.3 10*3/uL — ABNORMAL HIGH (ref 0.7–4.0)
MCHC: 32.4 g/dL (ref 30.0–36.0)
MCV: 100.5 fl — ABNORMAL HIGH (ref 78.0–100.0)
Monocytes Absolute: 1.2 10*3/uL — ABNORMAL HIGH (ref 0.1–1.0)
Monocytes Relative: 9 % (ref 3.0–12.0)
Neutro Abs: 6.7 10*3/uL (ref 1.4–7.7)
Neutrophils Relative %: 49.6 % (ref 43.0–77.0)
Platelets: 111 10*3/uL — ABNORMAL LOW (ref 150.0–400.0)
RBC: 3.15 Mil/uL — ABNORMAL LOW (ref 4.22–5.81)
RDW: 14.7 % (ref 11.5–15.5)
WBC: 13.5 10*3/uL — ABNORMAL HIGH (ref 4.0–10.5)

## 2023-04-10 LAB — BASIC METABOLIC PANEL
BUN: 9 mg/dL (ref 6–23)
CO2: 27 mEq/L (ref 19–32)
Calcium: 8.6 mg/dL (ref 8.4–10.5)
Chloride: 105 mEq/L (ref 96–112)
Creatinine, Ser: 0.95 mg/dL (ref 0.40–1.50)
GFR: 77.05 mL/min (ref >60.00)
Glucose, Bld: 102 mg/dL — ABNORMAL HIGH (ref 70–99)
Potassium: 3.6 mEq/L (ref 3.5–5.1)
Sodium: 142 mEq/L (ref 135–145)

## 2023-04-10 NOTE — Assessment & Plan Note (Signed)
Patient has recovered from this.  Discussed it may take quite some time to recover from this and his gastroenteritis.  He will monitor.

## 2023-04-10 NOTE — Assessment & Plan Note (Signed)
Patient has recovered.  He is no longer symptomatic.  He will monitor for recurrence.  Will recheck CBC and BMP.

## 2023-04-10 NOTE — Progress Notes (Signed)
[name], MD Phone: [phone]  [name] is a 77 y.o. male who presents today for hospital follow-up.  Campylobacter gastroenteritis: Patient presented to his oncology office and was found to be hypotensive.  He had been having diarrhea for 5 days.  He was admitted to the hospital and found to have Campylobacter gastroenteritis.  He was treated with azithromycin 500 mg daily for 7 days.  Patient notes at this time he is feeling quite a bit better.  He notes no diarrhea, vomiting, nausea, abdominal pain, or blood in his stool.  His wife does note they spoke with the health department.  They do note his dog had diarrhea as well.  He does have chickens at home.  COVID-19: Patient feels as though he is progressively recovering from having COVID in August.  Still weak though is improving.  Social History   Tobacco Use  Smoking Status Former   Current packs/day: 0.00   Average packs/day: 1 pack/day for 40.0 years (40.0 ttl pk-yrs)   Types: Cigarettes   Start date: 07/21/1966   Quit date: 07/21/2006   Years since quitting: 16.7  Smokeless Tobacco Never    Current Outpatient Medications on File Prior to Visit  Medication Sig Dispense Refill   acalabrutinib maleate (CALQUENCE) 100 MG tablet Take 1 tablet (100 mg total) by mouth 2 (two) times daily. 60 tablet 2   acetaminophen (TYLENOL) 325 MG tablet Take 1-2 tablets (325-650 mg total) by mouth every 4 (four) hours as needed for mild pain.     albuterol (VENTOLIN HFA) 108 (90 Base) MCG/ACT inhaler Inhale 2 puffs into the lungs every 6 (six) hours as needed for wheezing or shortness of breath. 1 each 0   amiodarone (PACERONE) 200 MG tablet Take 1/2 tablet (100 mg) by mouth twice daily 5 days a week (Patient taking differently: Take 100 mg by mouth See admin instructions. Take 1/2 tablet (100 mg) by mouth twice daily-takes 5 days a week. Mon, Tues, Thurs, Fri, Sat) 60 tablet 6   apixaban (ELIQUIS) 5 MG TABS tablet Take 5 mg by mouth 2  (two) times daily.     Apple Cider Vinegar 500 MG TABS Take 1 tablet by mouth daily.     azelastine (ASTELIN) 0.1 % nasal spray Place 2 sprays into both nostrils 2 (two) times daily. Use in each nostril as directed 30 mL 1   cetirizine (ZYRTEC) 10 MG tablet Take 10 mg by mouth daily as needed for allergies.      Cholecalciferol (VITAMIN D3) 250 MCG (10000 UT) capsule Take 10,000 Units by mouth daily.     Coenzyme Q10 (COQ10) 200 MG CAPS Take 200 mg by mouth daily.     cyanocobalamin (VITAMIN B12) 1000 MCG tablet Take 1,000 mcg by mouth daily.     Evolocumab (REPATHA SURECLICK) 140 MG/ML SOAJ Inject 140 mg into the skin every 14 (fourteen) days. 2 mL 11   ezetimibe (ZETIA) 10 MG tablet Take 1 tablet (10 mg total) by mouth daily. (Patient taking differently: Take 10 mg by mouth every morning.) 90 tablet 3   ferrous sulfate 325 (65 FE) MG EC tablet Take 325 mg by mouth daily.     fluticasone-salmeterol (WIXELA INHUB) 100-50 MCG/ACT AEPB Inhale 2 puffs into the lungs 2 (two) times daily.     mirtazapine (REMERON) 15 MG tablet Take 15 mg by mouth at bedtime as needed (for panic associated with PTSD).     mometasone-formoterol (DULERA) 200-5 MCG/ACT AERO Inhale 2 puffs into  the lungs 2 (two) times daily.     Multiple Vitamin (MULTIVITAMIN WITH MINERALS) TABS tablet Take 1 tablet by mouth daily.     pantoprazole (PROTONIX) 40 MG tablet Take 40 mg by mouth every morning.     tamsulosin (FLOMAX) 0.4 MG CAPS capsule Take 1 capsule (0.4 mg total) by mouth daily. (Patient taking differently: Take 0.4 mg by mouth every evening.) 30 capsule 3   Tiotropium Bromide Monohydrate 2.5 MCG/ACT AERS Inhale 2 puffs into the lungs at bedtime. Spiriva     traMADol (ULTRAM) 50 MG tablet Take 1 tablet (50 mg total) by mouth every 12 (twelve) hours as needed. 60 tablet 0   Current Facility-Administered Medications on File Prior to Visit  Medication Dose Route Frequency Provider Last Rate Last Admin   0.9 %  sodium chloride  infusion   Intravenous Continuous Borders, Daryl Eastern, NP   Stopped at 03/31/23 1524   potassium chloride 10 mEq in 100 mL IVPB  10 mEq Intravenous Once Borders, Daryl Eastern, NP         ROS see history of present illness  Objective  Physical Exam Vitals:   04/10/23 1142  BP: 122/74  Pulse: 80  Temp: 98 F (36.7 C)  SpO2: 97%    BP Readings from Last 3 Encounters:  04/10/23 122/74  04/08/23 (!) 148/71  04/03/23 128/66   Wt Readings from Last 3 Encounters:  04/10/23 241 lb 6.4 oz (109.5 kg)  04/08/23 200 lb 6.4 oz (90.9 kg)  04/01/23 200 lb 9.9 oz (91 kg)    Physical Exam Constitutional:      General: He is not in acute distress.    Appearance: He is not diaphoretic.  Cardiovascular:     Rate and Rhythm: Normal rate and regular rhythm.     Heart sounds: Normal heart sounds.  Pulmonary:     Effort: Pulmonary effort is normal.     Breath sounds: Normal breath sounds.  Abdominal:     General: Bowel sounds are normal. There is no distension.     Palpations: Abdomen is soft.     Tenderness: There is no abdominal tenderness.  Skin:    General: Skin is warm and dry.  Neurological:     Mental Status: He is alert.      Assessment/Plan: Please see individual problem list.  Campylobacter gastroenteritis Assessment & Plan: Patient has recovered.  He is no longer symptomatic.  He will monitor for recurrence.  Will recheck CBC and BMP.  Orders: -     Basic metabolic panel -     CBC with Differential/Platelet  COVID-19 virus infection Assessment & Plan: Patient has recovered from this.  Discussed it may take quite some time to recover from this and his gastroenteritis.  He will monitor.   Encounter for administration of vaccine -     FLU VACCINE MDCK QUAD W/Preservative     Return in about 3 months (around 07/10/2023).   [name], MD [facility]

## 2023-04-13 ENCOUNTER — Other Ambulatory Visit: Payer: Self-pay | Admitting: Family Medicine

## 2023-04-13 ENCOUNTER — Ambulatory Visit: Payer: Medicare HMO | Admitting: Family Medicine

## 2023-04-15 ENCOUNTER — Encounter: Payer: Self-pay | Admitting: Neurosurgery

## 2023-04-16 ENCOUNTER — Encounter: Payer: Self-pay | Admitting: Neurosurgery

## 2023-04-16 NOTE — Progress Notes (Signed)
Perioperative / Anesthesia Services  Pre-Admission Testing Clinical Review / Pre-Operative Anesthesia Consult  Date: 04/20/23  Patient Demographics:  Name: [name] DOB:   April 17, 1946 MRN:   [id number]  Planned Surgical Procedure(s):    Case: [id number] Date/Time: 04/21/23 1015   Procedure: C3-T1 LAMINOPLASTY   Anesthesia type: General   Pre-op diagnosis: Spondylosis, cervical, with myelopathy  - M47.12   Location: [facility] OR ROOM 03 / [facility]   Surgeons: Lovenia Kim, MD     NOTE: Available PAT nursing documentation and vital signs have been reviewed. Clinical nursing staff has updated patient's PMH/PSHx, current medication list, and drug allergies/intolerances to ensure comprehensive history available to assist in medical decision making as it pertains to the aforementioned surgical procedure and anticipated anesthetic course. Extensive review of available clinical information personally performed. [place] [facility] and P[facility] updated with any diagnoses/procedures that  may have been inadvertently omitted during his intake with the pre-admission testing department's nursing staff.  Clinical Discussion:  [name] is a 77 y.o. male who is submitted for pre-surgical anesthesia review and clearance prior to him undergoing the above procedure. Patient is a Former Smoker (40 pack years; quit 07/2006). Pertinent PMH includes: CAD (s/p CABG), MI, atrial fibrillation, ischemic cardiomyopathy, HFimpEF, sinus pause/SSS/symptomatic bradycardia (s/p PPM placement), ischemic CVA, TIA, HTN, HLD, COPD, agent orange exposure, OSAH (requires nocturnal PAP therapy), GERD (on daily PPI), CLL, chronic malignancy related pain, OA, cervical spondylosis with myelopathy, BPH, anxiety, PTSD.  Patient is followed by cardiology Mariah Milling, MD). He was last seen in the cardiology clinic on 10/07/2022; notes reviewed. At the time of his clinic visit, patient doing well overall from a  cardiovascular perspective. Patient denied any chest pain, shortness of breath, PND, orthopnea, palpitations, significant peripheral edema, weakness, fatigue, vertiginous symptoms, or presyncope/syncope. Patient with a past medical history significant for cardiovascular diagnoses. Documented physical exam was grossly benign, providing no evidence of acute exacerbation and/or decompensation of the patient's known cardiovascular conditions.  Of note, complete records regarding patient's cardiovascular history unavailable for review at time of consult.  Information gathered from patient report and from notes provided by his local cardiologist.  Patient suffered an MI patient underwent diagnostic LEFT heart catheterization on 05/08/2009 revealing multivessel CAD; 60% LM, 100% mid LAD, 40% LCx, 20% proximal RCA, 30% mid RCA, and 99% distal RCA.  Given the degree and complexity of his coronary artery disease, the decision was made to refer patient to CVTS for consideration of CABG procedure.  Patient underwent three-vessel CABG on 05/09/2009.  LIMA-LAD, SVG-PDA, and SVG-OM1 bypass graft placed.  Patient experiencing recurrent angina necessitating repeat LEFT heart catheterization on 10/17/2009.  Procedure revealed again multivessel CAD; 30% LM, 100% LAD, 20% ostial LCx, 20% proximal RCA, and 100% distal RCA.  SVG-OM1 and SVG-RPDA bypass grafts were occluded.  LIMA-LAD graft widely patent.  Patient with good LEFT to RIGHT collaterals, thus interventional cardiology made the decision to defer further intervention at that time opting for medical management.  Long-term cardiac event monitor study performed between 08/27/2015 and 09/25/2015 revealing paroxysmal atrial fibrillation/flutter with rates ranging from the high 30s to 140s.  Patient with 3-second cardiac pauses while in NSR and atrial fibrillation.  Patient experiencing symptoms consistent with symptomatic bradycardia/SSS.  Electrophysiology was consulted for  implanted cardiac device placement.  Patient underwent placement of a [employer] Advisa MRI SureScan implanted device on 03/03/2016.  Device is regularly interrogated by his primary electrophysiology team and was last interrogated on 04/17/2023,  at which time device was noted to be functioning properly.  Myocardial perfusion imaging study was performed on 12/25/2020 revealing a mildly decreased left ventricular systolic function with an EF of 45-54%.  Downsloping ST segment depression during stress noted in the precordial leads (V1-V4).  Cardiologist noted that ST depression on baseline EKG worsened with Lexiscan.  There was a medium defect of moderate severity present in the mid inferior, mid anteroseptal, apical anterior, and apex locations consistent with anterior/anteroseptal ischemia.  CT attenuation correction images showed significant aortic and coronary calcifications as well as a right ventricular pacemaker lead.  Overall, study determined to be indeterminant.  Patient underwent diagnostic right/LEFT heart catheterization on 02/04/2021 revealing multivessel CAD; 100% proximal LAD, 50% proximal RCA, 95% distal RCA, and 99% RPAV.  Again, SVG-RPDA and SVG-OM 1 bypass grafts noted to be chronically occluded.  LIMA-LAD graft widely patent.  Hemodynamics: LVEDP = 23 mmHg, mean RA = 13 mmHg, mean PA = 27 mmHg, mean PCWP = 22 mmHg, AO saturation 99%, PA saturation 70%, CO = 6.5 L, CI 3.0 L/min/m.  Most recent TTE was performed on 02/19/2021 revealing a normal left ventricular systolic function with an EF of 55 to 60%.  There were no regional wall motion abnormalities. Left ventricular diastolic Doppler parameters consistent with abnormal relaxation (G1DD). Right ventricular size was normal with mildly reduced systolic function.  There was trivial mitral valve regurgitation.  There was mild to moderate aortic valve sclerosis. All transvalvular gradients were noted to be normal providing no evidence  suggestive of valvular stenosis. Aorta normal in size with no evidence of aneurysmal dilatation.  Patient developed LEFT-sided weakness and slurred speech on 10/18/2021.  He was brought into the ED for evaluation.  Imaging was performed and neurology was consulted.  Patient diagnosed with a RIGHT-sided ischemic CVA.  Of note, patient reports a history of TIA back in 2017.  Patient with some residual weakness, however dysarthria fully resolved.  Patient with an atrial fibrillation diagnosis; CHA2DS2-VASc Score = 7 (age x2, HFimpEF, HTN, CVA x 2, previous MI). His rate and rhythm are currently being maintained on oral amiodarone. He is chronically anticoagulated using standard dose apixaban; reported to be compliant with therapy with no evidence or reports of GI/GU bleeding.  Blood pressure well controlled at 120/64 mmHg without the use of pharmacological interventions. He is on ezetimibe + PCSK9i (evolocumab) for his HLD diagnosis and further ASCVD prevention.  Patient is not diabetic.  He does have an OSAH diagnosis and is reported to be compliant with prescribed nocturnal PAP therapy.  Patient active per baseline.  He is able to complete all of his ADLs/IADLs independently without any cardiovascular limitation.  Patient requires the use of a scooter on ramps and a cane/walker when ambulating.  That said, patient still felt to be able to achieve at least 4 METS of physical activity without experiencing any significant degrees of angina/anginal equivalent symptoms.  Patient remains on acalabrutinib for his CLL diagnosis.  Medication managed through the [facility] Donneta Romberg, MD).  No changes were made to his medication regimen.  Patient follow-up with outpatient cardiology in 12 months or sooner if needed.  [name] is scheduled for C3-T1 LAMINOPLASTY on 04/21/2023 with Dr. Ernestine Mcmurray, MD. given patient's past medical history significant for cardiovascular diagnoses,  and current diagnosis of CLL, clearances were sought from patient's specialty care providers as follows.  Per oncology Donneta Romberg, MD), "okay to hold Calquence 1 week prior to and after  surgery.  Defer anticoagulation clearance to cardiology.  From an oncology perspective, patient may proceed with planned surgical intervention".  Per cardiology Mariah Milling, MD/Dick, FNP-C), "based ACC/AHA guidelines, the patient's past medical history, and the amount of time since his last clinic visit, this patient would be at an overall ACCEPTABLE risk for the planned procedure without further cardiovascular testing or intervention at this time.  We would recommend close monitoring of the heart rate postprocedure.  Continue amiodarone 100 mg twice daily".   Again, this patient is on daily oral anticoagulation therapy using a DOAC medication. He has been instructed on recommendations for holding his apixaban for 3 days prior to his procedure with plans to restart as soon as postoperative bleeding risk felt to be minimized by his attending surgeon. The patient has been instructed that his last dose of his apixaban should be on 04/17/2023.  Patient reports previous perioperative complications with anesthesia in the past.  Patient with (+) postoperative delirium, which exacerbates his known PTSD diagnosis.  Patient asking that he does not be touched during emergence from anesthesia.  In review of the available records, it is noted that patient underwent a general anesthetic course here at [facility] ([facility] in 10/2017 without documented complications.      04/17/2023    1:32 PM 04/10/2023   11:42 AM 04/08/2023   10:22 AM  Vitals with BMI  Height 5\' 8"  5\' 8"  5\' 8"   Weight 204 lbs 241 lbs 6 oz 200 lbs 6 oz  BMI 31.03 36.71 30.48  Systolic 140 122 161  Diastolic 76 74 71  Pulse 65 80 67    Providers/Specialists:   NOTE: Primary physician provider listed below. Patient may have been  seen by APP or partner within same practice.   PROVIDER ROLE / SPECIALTY LAST OV  Lovenia Kim, MD Neurosurgery (Surgeon) 02/24/2023  Glori Luis, MD Primary Care Provider 04/10/2023  Julien Nordmann, MD Cardiology 10/07/2022; update preop APP call on 03/13/2023  Sherryl Manges, MD Electrophysiology 04/17/2023  Louretta Shorten, MD Medical Oncology 02/25/2023  Borders, Ivin Booty, NP-C Palliative Medicine 03/31/2023   Allergies:  Atorvastatin and Augmentin [amoxicillin-pot clavulanate]  Current Home Medications:   No current facility-administered medications for this encounter.    acalabrutinib maleate (CALQUENCE) 100 MG tablet   acetaminophen (TYLENOL) 325 MG tablet   albuterol (VENTOLIN HFA) 108 (90 Base) MCG/ACT inhaler   amiodarone (PACERONE) 200 MG tablet   apixaban (ELIQUIS) 5 MG TABS tablet   Apple Cider Vinegar 500 MG TABS   azelastine (ASTELIN) 0.1 % nasal spray   cetirizine (ZYRTEC) 10 MG tablet   Cholecalciferol (VITAMIN D3) 250 MCG (10000 UT) capsule   Coenzyme Q10 (COQ10) 200 MG CAPS   cyanocobalamin (VITAMIN B12) 1000 MCG tablet   Evolocumab (REPATHA SURECLICK) 140 MG/ML SOAJ   ezetimibe (ZETIA) 10 MG tablet   ferrous sulfate 325 (65 FE) MG EC tablet   fluticasone-salmeterol (WIXELA INHUB) 100-50 MCG/ACT AEPB   mirtazapine (REMERON) 15 MG tablet   mometasone-formoterol (DULERA) 200-5 MCG/ACT AERO   Multiple Vitamin (MULTIVITAMIN WITH MINERALS) TABS tablet   pantoprazole (PROTONIX) 40 MG tablet   tamsulosin (FLOMAX) 0.4 MG CAPS capsule   Tiotropium Bromide Monohydrate 2.5 MCG/ACT AERS   traMADol (ULTRAM) 50 MG tablet    0.9 %  sodium chloride infusion   potassium chloride 10 mEq in 100 mL IVPB   History:   Past Medical History:  Diagnosis Date   Agent orange exposure  Anxiety    Arthritis    Atrial fibrillation (HCC) 2013   a.) Dx'd in 2013; recurrence 02/2014; b.) CHA2DS2-VASc = 7 (age x2, HFimpEF, HTN, CVA x 2, previous MI); c.) cardiac  rate/rhythm maintained on oral amiodarone; chronically anticoagulated using apixaban   BPH (benign prostatic hyperplasia)    C. 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)    a.) MV 08/23/2015: EF 50%; b.) TTE 08/23/2015: EF 55-60%, G1DD; c.) TTE 11/02/2015: EF 55-60%, mild LA dil; d.) TTE 02/04/2020: EF 55-60%, mild LVH, G1DD, PASP 44.3, mild-mod AoV sclerosis; e.) MV 12/25/2020: EF 45-54%; f.)  R/LHC 02/04/2021: mRA 13, mPA 27, mPCWP 22, LVEDP 23, Ao sat 99, PA sat 70, CO 6.5, CI 3.0; g.) TTE 02/19/2021: EF 55-60%, G1DD, mildly red RVSF, triv MR, mild-mod AoV scler   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 EF 55%, mild antlat HK;  c.) s/p R/LHC 02/04/2021: LM mild diff dzs, LAD 100p, LCX nl, RCA 50p, 95d, 99 side branch, RPAV fills via LCx. SVG->RPDA 100, SVG->OM1 100, LIMA->LAD patent. EF 35-45%.   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    Hyperlipidemia LDL goal <70    Hypertension    Hypokalemia    Ischemic cardiomyopathy    a.) MV 08/23/2015: EF 50%; b.) TTE 08/23/2015: EF 55-60%; c.) TTE 11/02/2015: EF 55-60%; d.) TTE 02/04/2020: EF 55-60%; e.) MV 12/25/2020:  EF 45-54%; f.) R/LHC 02/04/2021: mRA 13, mPA 27, mPCWP 22, LVEDP 23, Ao sat 99, PA sat 70, CO 6.5, CI 3.0; g.) TTE 02/19/2021: EF 55-60%   Ischemic cerebrovascular accident (CVA) (HCC) 10/18/2021   Myocardial infarction (HCC) 2010   On amiodarone therapy    On apixaban therapy    OSA on CPAP    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   TIA (transient ischemic attack) 11/02/2015   Past Surgical History:  Procedure Laterality Date   ABDOMINAL HERNIA REPAIR     APPENDECTOMY  06/21/1985   CORONARY ARTERY BYPASS GRAFT N/A 05/09/2009  EP IMPLANTABLE DEVICE N/A 03/03/2016   Procedure: Pacemaker Implant;  Surgeon: Duke Salvia, MD;  Location: [facility] INVASIVE CV LAB;  Service: Cardiovascular;  Laterality: N/A;   FOREIGN BODY REMOVAL  1968   "shrapnel in my tailbone"   INGUINAL HERNIA REPAIR Right    JOINT REPLACEMENT Right 2018   LAPAROSCOPIC CHOLECYSTECTOMY     LEFT HEART CATH AND CORONARY ANGIOGRAPHY Left 05/08/2009   Procedure: LEFT HEART CATH AND CORONARY ANGIOGRAPHY; Location: [facility]: Surgeon: Lorine Bears, MD   LEFT HEART CATH AND CORS/GRAFTS ANGIOGRAPHY Left 10/17/2009   Procedure: LEFT HEART CATH AND CORS/GRAFTS ANGIOGRAPHY; Location: [facility]; Surgeon: Lorine Bears, MD   RIGHT/LEFT HEART CATH AND CORONARY/GRAFT ANGIOGRAPHY N/A 02/04/2021   Procedure: RIGHT/LEFT HEART CATH AND CORONARY/GRAFT ANGIOGRAPHY;  Surgeon: Yvonne Kendall, MD;  Location: [facility] INVASIVE CV LAB;  Service: Cardiovascular;  Laterality: N/A;   TONSILLECTOMY AND ADENOIDECTOMY  1956   TOTAL HIP ARTHROPLASTY Right 10/22/2016   Procedure: TOTAL HIP ARTHROPLASTY;  Surgeon: Donato Heinz, MD;  Location: [facility] ORS;  Service: Orthopedics;  Laterality: Right;   TOTAL HIP ARTHROPLASTY Left 11/04/2017   Procedure: TOTAL HIP ARTHROPLASTY;  Surgeon: Donato Heinz, MD;  Location: [facility] ORS;  Service: Orthopedics;  Laterality: Left;   Family History  Problem Relation Age of Onset   Heart disease Mother    Heart attack Mother    Coronary artery disease Other        family history   Social History   Tobacco Use   Smoking status: Former    Current packs/day: 0.00    Average packs/day: 1 pack/day for 40.0 years (40.0 ttl pk-yrs)    Types: Cigarettes    Start date: 07/21/1966    Quit date: 07/21/2006    Years since quitting: 16.7   Smokeless tobacco: Never  Vaping Use   Vaping status: Former  Substance Use Topics   Alcohol use: Not Currently   Drug use: No    Pertinent Clinical Results:  LABS:   No visits with results within 3 Day(s) from this visit.  Latest known visit with results is:  Office Visit on 04/17/2023  Component Date Value Ref Range Status   Pulse Generator Manufacturer 04/17/2023 MERM   Final   Date Time Interrogation Session 04/17/2023 [id number]   Final   Pulse Gen Model 04/17/2023 [id number] Advisa DR MRI   Final   Pulse Gen Serial Number 04/17/2023 [id number]   Final   Clinic Name 04/17/2023 [facility]   Final   Implantable Pulse Generator Type 04/17/2023 Implantable Pulse Generator   Final   Implantable Pulse Generator Implan* 04/17/2023 [id number]   Final   Implantable Lead Manufacturer 04/17/2023 MERM   Final   Implantable Lead Model 04/17/2023 5076 CapSureFix Novus MRI SureScan   Final   Implantable Lead Serial Number 04/17/2023 [id number]   Final   Implantable Lead Implant Date 04/17/2023 [id number]   Final    Implantable Lead Location Detail 1 04/17/2023 UNKNOWN   Final   Implantable Lead Location 04/17/2023 [id number]   Final   Implantable Lead Connection Status 04/17/2023 [id number]   Final   Implantable Lead Manufacturer 04/17/2023 MERM   Final   Implantable Lead Model 04/17/2023 5076 CapSureFix Novus MRI SureScan   Final   Implantable Lead Serial Number 04/17/2023 [id number]   Final   Implantable Lead Implant Date 04/17/2023 [id number]   Final   Implantable Lead Location Detail 1 04/17/2023 UNKNOWN   Final   Implantable Lead Location 04/17/2023 [id number]   Final  Implantable Lead Connection Status 04/17/2023 [id number]   Final    ECG: Date: 12/07/2022 Time ECG obtained: 1257  PM Rate: 64 bpm Rhythm:  Atrial paced rhythm with prolonged AV conduction Axis (leads I and aVF): Normal Intervals: PR 302 ms. QRS 96 ms. QTc 464 ms. ST segment and T wave changes: Nonspecific ST abnormality Comparison: Similar to previous tracing obtained on 06/30/2022 NOTE: Tracing obtained at [facility]; unable for review. Above based on cardiologist's interpretation.    IMAGING / PROCEDURES: TRANSTHORACIC ECHOCARDIOGRAM performed on 02/19/2021 Left ventricular ejection fraction, by estimation, is 55 to 60%. The left ventricle has normal function. The left ventricle has no regional wall motion abnormalities. Left ventricular diastolic parameters are consistent with Grade I diastolic dysfunction (impaired relaxation).  Right ventricular systolic function is mildly reduced. The right ventricular size is normal. Mildly increased right ventricular wall thickness. Tricuspid regurgitation signal is inadequate for assessing PA pressure.  The mitral valve was not well visualized. Trivial mitral valve regurgitation. No evidence of mitral stenosis.  The aortic valve has an indeterminant number of cusps. There is mild calcification of the aortic valve. There is moderate thickening of the aortic valve. Aortic valve regurgitation is not visualized.  Mild to moderate aortic valve sclerosis/calcification is present, without any evidence of aortic stenosis.  The inferior vena cava is dilated in size with >50% respiratory variability, suggesting right atrial pressure of 8 mmHg.   RIGHT/LEFT HEART CATHETERIZATION AND CORONARY ANGIOGRAPHY performed on 02/04/2021 Severe two-vessel coronary artery disease, including chronic total occlusion of the proximal LAD and 90-99% distal RCA/rPLA disease. RPL branches and rPDA fill via left-to-right collaterals. Widely patent LIMA-LAD. Chronically occluded SVG-OM1 and SVG-rPDA. Mildly to moderately reduced left ventricular systolic function (LVEDP 40-45%). Mildly elevated left heart and pulmonary artery pressures; moderately elevated right heart filling pressures; normal Fick cardiac output/index. RA (mean): 13 mmHg RV (S/EDP): 40/13 mmHg PA (S/D, mean): 40/20 (27) mmHg PCWP (mean): 22 mmHg Ao sat: 99% PA sat: 70% Fick CO: 6.5 L/min Fick CI: 3.0 L/min/m^2  Recommendations: Optimize medical therapy.  I will add standing furosemide 40 mg PO daily and isosorbide mononitrate 30 mg daily. Continue secondary prevention of coronary artery disease. Consider repeat CBC in 1-2 weeks, given iSTAT hemoglobin 9-10 and recent fall with significant hip/thigh bruising on chronic anticoagulation. If no evidence of bleeding/vascular injury, restart apixaban tomorrow morning.     MYOCARDIAL PERFUSION IMAGING STUDY (LEXISCAN) performed on 12/25/2020 Downsloping ST segment depression ST segment depression was noted during stress in the V2, V3, V1 and V4 leads. ST depression on baseline EKG worsened with Lexiscan. There is a medium defect of moderate severity present in the mid anterior, mid anteroseptal, apical anterior and apex location. Findings consistent with anterior/anteroseptal ischemia. This is an intermediate risk study. The left ventricular ejection fraction is mildly decreased (45-54%). CT attenuation images  shows significant aortic and coronary calcifications as well as RV pacemaker lead.  Impression and Plan:  [name] has been referred for pre-anesthesia review and clearance prior to him undergoing the planned anesthetic and procedural courses. Available labs, pertinent testing, and imaging results were personally reviewed by me in preparation for upcoming operative/procedural course. [facility] medical record has been updated following extensive record review and patient interview with PAT staff.   This patient has been appropriately cleared by cardiology (ACCEPTABLE) and by oncology (ACCEPTABLE) with the individually indicated risks of significant perioperative complications. Completed perioperative prescription for cardiac device management documentation completed by primary cardiology team and placed on  patient's chart for review by the surgical/anesthetic team on the day of his procedure. Electrophysiology indicating that procedure should will interfere with planned surgical procedure. Due to required positioning (prone) for the procedure, EP recommending reprogramming to asynchronous pacing mode during the procedure, with plans to return to normal programming afterwards. Reached out to [employer] industry representative Asencion Islam) who advised that reprogramming is not necessary due to prone positioning. Industry representative indicating that as long as magnet is secured over the device, the device will be in asynchronous mode. Removal of the magnet will result in device reverting to previously programmed settings. Beyond normal hemodynamic monitoring, and the aforementioned use of the magnet, there are no further recommendations from industry representative.   Based on clinical review performed today (04/20/23), barring any significant acute changes in the patient's overall condition, it is anticipated that he will be able to proceed with the planned surgical intervention. Any acute changes in  clinical condition may necessitate his procedure being postponed and/or cancelled. Patient will meet with anesthesia team (MD and/or CRNA) on the day of his procedure for preoperative evaluation/assessment. Questions regarding anesthetic course will be fielded at that time.   Pre-surgical instructions were reviewed with the patient during his PAT appointment, and questions were fielded to satisfaction by PAT clinical staff. He has been instructed on which medications that he will need to hold prior to surgery, as well as the ones that have been deemed safe/appropriate to take on the day of his procedure. As part of the general education provided by PAT, patient made aware both verbally and in writing, that he would need to abstain from the use of any illegal substances during his perioperative course.  He was advised that failure to follow the provided instructions could necessitate case cancellation or result in serious perioperative complications up to and including death. Patient encouraged to contact PAT and/or his surgeon's office to discuss any questions or concerns that may arise prior to surgery; verbalized understanding.   Quentin Mulling, MSN, APRN, FNP-C, [facility]  Perioperative Services Nurse Practitioner Phone: [phone] Fax: [phone] 04/20/23 11:35 AM  NOTE: This note has been prepared using Dragon dictation software. Despite my best ability to proofread, there is always the potential that unintentional transcriptional errors may still occur from this process.

## 2023-04-17 ENCOUNTER — Encounter: Payer: Self-pay | Admitting: Cardiology

## 2023-04-17 ENCOUNTER — Encounter: Payer: Self-pay | Admitting: Internal Medicine

## 2023-04-17 ENCOUNTER — Ambulatory Visit: Payer: Medicare HMO | Attending: Cardiology | Admitting: Cardiology

## 2023-04-17 VITALS — BP 140/76 | HR 65 | Ht 68.0 in | Wt 204.0 lb

## 2023-04-17 DIAGNOSIS — I495 Sick sinus syndrome: Secondary | ICD-10-CM

## 2023-04-17 DIAGNOSIS — Z950 Presence of cardiac pacemaker: Secondary | ICD-10-CM

## 2023-04-17 DIAGNOSIS — D6869 Other thrombophilia: Secondary | ICD-10-CM | POA: Diagnosis not present

## 2023-04-17 DIAGNOSIS — I480 Paroxysmal atrial fibrillation: Secondary | ICD-10-CM | POA: Diagnosis not present

## 2023-04-17 LAB — CUP PACEART INCLINIC DEVICE CHECK
Date Time Interrogation Session: 20240927153921
Implantable Lead Connection Status: 753985
Implantable Lead Connection Status: 753985
Implantable Lead Implant Date: 20170814
Implantable Lead Implant Date: 20170814
Implantable Lead Location: 753859
Implantable Lead Location: 753860
Implantable Lead Model: 5076
Implantable Lead Model: 5076
Implantable Pulse Generator Implant Date: 20170814

## 2023-04-17 NOTE — Progress Notes (Signed)
Cardiology Office Note Date:  04/17/2023  Patient ID:  [name], [name] 06/18/1946, MRN [id number] PCP:  Glori Luis, MD  Cardiologist:  Julien Nordmann, MD Electrophysiologist: Sherryl Manges, MD     Chief Complaint: device follow-up  History of Present Illness: [name] is a 77 y.o. male with PMH notable for SND s/p PPM, CHF, CAD s/p CABG, parox AFib, HTN, COPD, OSA, CLL; seen today for Sherryl Manges, MD for cardiac clearance for C3-T1 laminoplasty.  Last saw Dr. Graciela Husbands 2022, more recently by Dr. Mariah Milling 09/2022.  On follow-up today, he is doing well. He is eager to proceed with spinal surgery in hopes that it will improve his ability to ambulate and not fall. He has some DOE, but thinks it is due to his COPD, specifically [place].  He denies chest pain, chest pressure, palpitations. No syncope.  Diligently takes eliquis BID, no bleeding concerns. Does not think he has had any afib episodes.    Device Information: MDT dual chamber PPM, imp 02/2016; dx SND  AAD History: Amiodarone   Past Medical History:  Diagnosis Date   Agent orange exposure    Anxiety    Arthritis    Atrial fibrillation (HCC) 2013   a.) Dx'd in 2013; recurrence 02/2014; b.) CHA2DS2-VASc = 7 (age x2, HFimpEF, HTN, CVA x 2, previous MI); c.) cardiac rate/rhythm maintained on oral amiodarone; chronically anticoagulated using apixaban   BPH (benign prostatic hyperplasia)    C. 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)    a.) MV 08/23/2015: EF 50%; b.) TTE 08/23/2015: EF 55-60%, G1DD; c.) TTE 11/02/2015: EF 55-60%, mild LA dil; d.) TTE 02/04/2020: EF 55-60%, mild LVH, G1DD, PASP 44.3, mild-mod AoV sclerosis; e.) MV 12/25/2020: EF  45-54%; f.)  R/LHC 02/04/2021: mRA 13, mPA 27, mPCWP 22, LVEDP 23, Ao sat 99, PA sat 70, CO 6.5, CI 3.0; g.) TTE 02/19/2021: EF 55-60%, G1DD, mildly red RVSF, triv MR, mild-mod AoV scler   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 EF 55%, mild antlat HK;  c.) s/p R/LHC 02/04/2021: LM mild diff dzs, LAD 100p, LCX nl, RCA 50p, 95d, 99 side branch, RPAV fills via LCx. SVG->RPDA 100, SVG->OM1 100, LIMA->LAD patent. EF 35-45%.   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    Hyperlipidemia LDL goal <70    Hypertension    Hypokalemia    Ischemic cardiomyopathy    a.) MV 08/23/2015: EF 50%; b.) TTE 08/23/2015: EF 55-60%; c.) TTE 11/02/2015: EF 55-60%; d.) TTE 02/04/2020: EF 55-60%; e.) MV 12/25/2020: EF 45-54%; f.) R/LHC 02/04/2021: mRA 13, mPA  27, mPCWP 22, LVEDP 23, Ao sat 99, PA sat 70, CO 6.5, CI 3.0; g.) TTE 02/19/2021: EF 55-60%   Ischemic cerebrovascular accident (CVA) (HCC) 10/18/2021   Myocardial infarction (HCC) 2010   On amiodarone therapy    On apixaban therapy    OSA on CPAP    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   TIA (transient ischemic attack) 11/02/2015    Past Surgical History:  Procedure Laterality Date   ABDOMINAL HERNIA REPAIR     APPENDECTOMY  06/21/1985   CORONARY ARTERY BYPASS GRAFT N/A 05/09/2009   EP IMPLANTABLE DEVICE N/A 03/03/2016   Procedure: Pacemaker Implant;  Surgeon: Duke Salvia, MD;  [facility] INVASIVE CV LAB;  Service: Cardiovascular;  Laterality: N/A;   FOREIGN BODY REMOVAL  1968   "shrapnel in my tailbone"   INGUINAL HERNIA REPAIR Right    JOINT REPLACEMENT Right 2018   LAPAROSCOPIC CHOLECYSTECTOMY     LEFT HEART CATH AND CORONARY ANGIOGRAPHY Left 05/08/2009   Procedure: LEFT HEART CATH AND CORONARY ANGIOGRAPHY; Location: [facility]: Surgeon: Lorine Bears, MD   LEFT HEART CATH AND CORS/GRAFTS ANGIOGRAPHY Left 10/17/2009   Procedure: LEFT HEART CATH AND CORS/GRAFTS ANGIOGRAPHY; Location: [facility]; Surgeon: Lorine Bears, MD   RIGHT/LEFT HEART CATH AND CORONARY/GRAFT ANGIOGRAPHY N/A 02/04/2021   Procedure: RIGHT/LEFT HEART CATH AND CORONARY/GRAFT ANGIOGRAPHY;  Surgeon: Yvonne Kendall, MD;  Location: [facility] INVASIVE CV LAB;  Service: Cardiovascular;  Laterality: N/A;   TONSILLECTOMY AND ADENOIDECTOMY  1956   TOTAL HIP ARTHROPLASTY Right 10/22/2016   Procedure: TOTAL HIP ARTHROPLASTY;  Surgeon: Donato Heinz, MD;  Location: [facility] ORS;  Service: Orthopedics;  Laterality: Right;   TOTAL HIP ARTHROPLASTY Left 11/04/2017   Procedure: TOTAL HIP ARTHROPLASTY;  Surgeon: Donato Heinz, MD;  Location: [facility] ORS;  Service: Orthopedics;  Laterality: Left;    Current Outpatient Medications  Medication Instructions   acetaminophen (TYLENOL) 325-650 mg, Oral, Every 4 hours PRN   albuterol  (VENTOLIN HFA) 108 (90 Base) MCG/ACT inhaler 2 puffs, Inhalation, Every 6 hours PRN   amiodarone (PACERONE) 200 MG tablet Take 1/2 tablet (100 mg) by mouth twice daily 5 days a week   apixaban (ELIQUIS) 5 mg, Oral, 2 times daily   Apple Cider Vinegar 500 MG TABS 1 tablet, Oral, Daily   azelastine (ASTELIN) 0.1 % nasal spray 2 sprays, Each Nare, 2 times daily, Use in each nostril as directed   Calquence 100 mg, Oral, 2 times daily   cetirizine (ZYRTEC) 10 mg, Oral, Daily PRN   CoQ10 200 mg, Oral, Daily   cyanocobalamin (VITAMIN B12) 1,000 mcg, Oral, Daily   ezetimibe (ZETIA) 10 mg, Oral, Daily   ferrous sulfate 325 mg, Oral, Daily   fluticasone-salmeterol (WIXELA INHUB) 100-50 MCG/ACT AEPB 2 puffs, Inhalation, 2 times daily   mirtazapine (REMERON) 15 mg, Oral, At bedtime PRN   mometasone-formoterol (DULERA)  200-5 MCG/ACT AERO 2 puffs, Inhalation, 2 times daily   Multiple Vitamin (MULTIVITAMIN WITH MINERALS) TABS tablet 1 tablet, Oral, Daily   pantoprazole (PROTONIX) 40 MG tablet TAKE 1 TABLET(40 MG) BY MOUTH DAILY   Repatha SureClick 140 mg, Subcutaneous, Every 14 days   tamsulosin (FLOMAX) 0.4 mg, Oral, Daily   Tiotropium Bromide Monohydrate 2.5 MCG/ACT AERS 2 puffs, Inhalation, Daily at bedtime, Spiriva   traMADol (ULTRAM) 50 mg, Oral, Every 12 hours PRN   Vitamin D3 10,000 Units, Oral, Daily    Social History:  The patient  reports that he quit smoking about 16 years ago. His smoking use included cigarettes. He started smoking about 56 years ago. He has a 40 pack-year smoking history. He has never used smokeless tobacco. He reports that he does not currently use alcohol. He reports that he does not use drugs.   Family History:  The patient's family history includes Coronary artery disease in an other family member; Heart attack in his mother; Heart disease in his mother.  ROS:  Please see the history of present illness. All other systems are reviewed and otherwise negative.   PHYSICAL  EXAM:  VS:  BP (!) 140/76 (BP Location: Left Arm, Patient Position: Sitting, Cuff Size: Normal)   Pulse 65   Ht 5\' 8"  (1.727 m)   Wt 204 lb (92.5 kg)   SpO2 97%   BMI 31.02 kg/m  BMI: Body mass index is 31.02 kg/m.  GEN- The patient is well appearing, alert and oriented x 3 today.   Lungs- Clear to ausculation bilaterally, normal work of breathing.  Heart- Regular rate and rhythm, no murmurs, rubs or gallops Extremities- 1+ peripheral edema, warm, dry Skin-  device pocket well-healed, no tethering   Device interrogation done today and reviewed by myself:  Battery 3 years Lead thresholds, impedence, sensing stable  No episodes Histograms blunted on LRL Adjusted RR  EKG is ordered. Personal review of EKG from today shows:    EKG Interpretation Date/Time:  Friday April 17 2023 13:40:12 EDT Ventricular Rate:  65 PR Interval:  276 QRS Duration:  96 QT Interval:  434 QTC Calculation: 452 R Axis:   29  Text Interpretation: Atrial-paced rhythm with prolonged AV conduction Nonspecific ST and T wave abnormality Confirmed by Sherie Don [phone]) on 04/17/2023 1:45:07 PM    Recent Labs: 03/31/2023: ALT 14 04/03/2023: Magnesium 1.8 04/10/2023: BUN 9; Creatinine, Ser 0.95; Hemoglobin 10.3; Platelets 111.0; Potassium 3.6; Sodium 142  No results found for requested labs within last 365 days.   Estimated Creatinine Clearance: 71.8 mL/min (by C-G formula based on SCr of 0.95 mg/dL).   Wt Readings from Last 3 Encounters:  04/17/23 204 lb (92.5 kg)  04/10/23 241 lb 6.4 oz (109.5 kg)  04/08/23 200 lb 6.4 oz (90.9 kg)     Additional studies reviewed include: Previous EP, cardiology notes.   TTE, 02/19/2021  1. Left ventricular ejection fraction, by estimation, is 55 to 60%. The left ventricle has normal function. The left ventricle has no regional wall motion abnormalities. Left ventricular diastolic parameters are consistent with Grade I diastolic dysfunction (impaired relaxation).    2. Right ventricular systolic function is mildly reduced. The right ventricular size is normal. Mildly increased right ventricular wall thickness. Tricuspid regurgitation signal is inadequate for assessing PA pressure.   3. The mitral valve was not well visualized. Trivial mitral valve regurgitation. No evidence of mitral stenosis.   4. The aortic valve has an indeterminant number of cusps. There  is mild calcification of the aortic valve. There is moderate thickening of the aortic valve. Aortic valve regurgitation is not visualized. Mild to moderate aortic valve sclerosis/calcification is present, without any evidence of aortic stenosis.   5. The inferior vena cava is dilated in size with >50% respiratory variability, suggesting right atrial pressure of 8 mmHg.   R/LHC, 02/04/2021 Severe two-vessel coronary artery disease, including chronic total occlusion of the proximal LAD and 90-99% distal RCA/rPLA disease. RPL branches and rPDA fill via left-to-right collaterals. Widely patent LIMA-LAD. Chronically occluded SVG-OM1 and SVG-rPDA. Mildly to moderately reduced left ventricular systolic function (LVEDP 40-45%). Mildly elevated left heart and pulmonary artery pressures. Moderately elevated right heart filling pressures. Normal Fick cardiac output/index.  ASSESSMENT AND PLAN:  #) SND s/p PPM Device functioning well, see paceart for details Histograms blunted, so RR slightly adjusted  #) Parox AFib No recent afib episodes by device Cont amiodarone 500mg  / week Recent LFTs stable Recent thyroid labs (via care everywhere) stable  #) Hypercoag d/t parox afib CHA2DS2-VASc Score = 6 [CHF History: 0, HTN History: 1, Diabetes History: 0, Stroke History: 2, Vascular Disease History: 1, Age Score: 2, Gender Score: 0].  Therefore, the patient's annual risk of stroke is 9.7 %.    Stroke ppx - 5mg  eliquis, appropriately dosed No bleeding concerns       Current medicines are reviewed at length  with the patient today.   The patient does not have concerns regarding his medicines.  The following changes were made today:  none  Labs/ tests ordered today include:  Orders Placed This Encounter  Procedures   EKG 12-Lead     Disposition: Follow up with Dr. Graciela Husbands or EP APP in 6 months   Signed, Sherie Don, NP  04/17/23  1:45 PM  Electrophysiology [facility]

## 2023-04-17 NOTE — Patient Instructions (Addendum)
Medication Instructions:  The current medical regimen is effective;  continue present plan and medications.  *If you need a refill on your cardiac medications before your next appointment, please call your pharmacy*   Follow-Up: At [facility], you and your health needs are our priority.  As part of our continuing mission to provide you with exceptional heart care, we have created designated Provider Care Teams.  These Care Teams include your primary Cardiologist (physician) and Advanced Practice Providers (APPs -  Physician Assistants and Nurse Practitioners) who all work together to provide you with the care you need, when you need it.  We recommend signing up for the patient portal called "MyChart".  Sign up information is provided on this After Visit Summary.  MyChart is used to connect with patients for Virtual Visits (Telemedicine).  Patients are able to view lab/test results, encounter notes, upcoming appointments, etc.  Non-urgent messages can be sent to your provider as well.   To learn more about what you can do with MyChart, go to [email or web address].    Your next appointment:   6 month(s)  Provider:   Sherie Don, NP

## 2023-04-17 NOTE — Progress Notes (Signed)
PERIOPERATIVE PRESCRIPTION FOR IMPLANTED CARDIAC DEVICE PROGRAMMING  Patient Information: Name:  [name]  DOB:  July 31, 1945  MRN:  [id number]  Planned Procedure: C3-T1 LAMINOPLASTY   Surgeon:  Dr. Ernestine Mcmurray, MD  Requesting device clearance: Quentin Mulling, FNP-C  Date of Procedure:  04/21/2023  Cautery will be used.   Device Information:  Clinic EP Physician:  Sherryl Manges, MD   Device Type:  Pacemaker Manufacturer and Phone #:  Medtronic: [phone] Pacemaker Dependent?:  No. Date of Last Device Check:  04/17/2023 Normal Device Function?:  Yes.    Electrophysiologist's Recommendations:  Have magnet available. Provide continuous ECG monitoring when magnet is used or reprogramming is to be performed.  Procedure will likely interfere with device function.  Device should be programmed:  Asynchronous pacing during procedure and returned to normal programming after procedure Recommendation based on position during surgery.  If Pt is prone, would recommend reprogramming.  If Pt is supine, ok to use a magnet during procedure.  Per Device Clinic Standing Orders, Wiliam Ke, RN  3:52 PM 04/17/2023

## 2023-04-20 NOTE — Addendum Note (Signed)
Addended by: Warden Fillers on: 04/20/2023 09:04 AM   Modules accepted: Orders

## 2023-04-21 ENCOUNTER — Encounter
Admission: RE | Disposition: A | Discharge status: Home / Self Care | Payer: Self-pay | Source: Ambulatory Visit | Attending: Neurosurgery

## 2023-04-21 ENCOUNTER — Inpatient Hospital Stay: Payer: Medicare HMO

## 2023-04-21 ENCOUNTER — Inpatient Hospital Stay: Payer: Medicare HMO | Admitting: Urgent Care

## 2023-04-21 ENCOUNTER — Encounter: Payer: Self-pay | Admitting: Neurosurgery

## 2023-04-21 ENCOUNTER — Other Ambulatory Visit: Payer: Self-pay

## 2023-04-21 ENCOUNTER — Inpatient Hospital Stay
Admission: RE | Admit: 2023-04-21 | Discharge: 2023-04-25 | DRG: 519 | Disposition: A | Discharge status: Home / Self Care | Payer: Medicare HMO | Source: Ambulatory Visit | Attending: Neurosurgery | Admitting: Neurosurgery

## 2023-04-21 DIAGNOSIS — Z79899 Other long term (current) drug therapy: Secondary | ICD-10-CM | POA: Diagnosis not present

## 2023-04-21 DIAGNOSIS — Z01818 Encounter for other preprocedural examination: Secondary | ICD-10-CM

## 2023-04-21 DIAGNOSIS — Z856 Personal history of leukemia: Secondary | ICD-10-CM

## 2023-04-21 DIAGNOSIS — D62 Acute posthemorrhagic anemia: Secondary | ICD-10-CM | POA: Diagnosis not present

## 2023-04-21 DIAGNOSIS — F419 Anxiety disorder, unspecified: Secondary | ICD-10-CM | POA: Diagnosis present

## 2023-04-21 DIAGNOSIS — Z96643 Presence of artificial hip joint, bilateral: Secondary | ICD-10-CM | POA: Diagnosis present

## 2023-04-21 DIAGNOSIS — I4891 Unspecified atrial fibrillation: Secondary | ICD-10-CM | POA: Diagnosis present

## 2023-04-21 DIAGNOSIS — M4802 Spinal stenosis, cervical region: Secondary | ICD-10-CM | POA: Diagnosis present

## 2023-04-21 DIAGNOSIS — I2510 Atherosclerotic heart disease of native coronary artery without angina pectoris: Secondary | ICD-10-CM | POA: Diagnosis present

## 2023-04-21 DIAGNOSIS — F05 Delirium due to known physiological condition: Secondary | ICD-10-CM | POA: Diagnosis not present

## 2023-04-21 DIAGNOSIS — I252 Old myocardial infarction: Secondary | ICD-10-CM | POA: Diagnosis not present

## 2023-04-21 DIAGNOSIS — H9193 Unspecified hearing loss, bilateral: Secondary | ICD-10-CM | POA: Diagnosis present

## 2023-04-21 DIAGNOSIS — N400 Enlarged prostate without lower urinary tract symptoms: Secondary | ICD-10-CM | POA: Diagnosis present

## 2023-04-21 DIAGNOSIS — K5900 Constipation, unspecified: Secondary | ICD-10-CM

## 2023-04-21 DIAGNOSIS — Z8616 Personal history of COVID-19: Secondary | ICD-10-CM

## 2023-04-21 DIAGNOSIS — F4310 Post-traumatic stress disorder, unspecified: Secondary | ICD-10-CM | POA: Diagnosis present

## 2023-04-21 DIAGNOSIS — K219 Gastro-esophageal reflux disease without esophagitis: Secondary | ICD-10-CM | POA: Diagnosis present

## 2023-04-21 DIAGNOSIS — M542 Cervicalgia: Secondary | ICD-10-CM

## 2023-04-21 DIAGNOSIS — I110 Hypertensive heart disease with heart failure: Secondary | ICD-10-CM | POA: Diagnosis present

## 2023-04-21 DIAGNOSIS — E785 Hyperlipidemia, unspecified: Secondary | ICD-10-CM | POA: Diagnosis present

## 2023-04-21 DIAGNOSIS — Z7901 Long term (current) use of anticoagulants: Secondary | ICD-10-CM | POA: Diagnosis not present

## 2023-04-21 DIAGNOSIS — N179 Acute kidney failure, unspecified: Secondary | ICD-10-CM | POA: Diagnosis present

## 2023-04-21 DIAGNOSIS — I495 Sick sinus syndrome: Secondary | ICD-10-CM | POA: Diagnosis present

## 2023-04-21 DIAGNOSIS — J449 Chronic obstructive pulmonary disease, unspecified: Secondary | ICD-10-CM | POA: Diagnosis present

## 2023-04-21 DIAGNOSIS — Z8673 Personal history of transient ischemic attack (TIA), and cerebral infarction without residual deficits: Secondary | ICD-10-CM

## 2023-04-21 DIAGNOSIS — Z8249 Family history of ischemic heart disease and other diseases of the circulatory system: Secondary | ICD-10-CM

## 2023-04-21 DIAGNOSIS — I255 Ischemic cardiomyopathy: Secondary | ICD-10-CM | POA: Diagnosis present

## 2023-04-21 DIAGNOSIS — M4712 Other spondylosis with myelopathy, cervical region: Principal | ICD-10-CM | POA: Diagnosis present

## 2023-04-21 DIAGNOSIS — Z7409 Other reduced mobility: Secondary | ICD-10-CM | POA: Diagnosis present

## 2023-04-21 DIAGNOSIS — I5032 Chronic diastolic (congestive) heart failure: Secondary | ICD-10-CM | POA: Diagnosis present

## 2023-04-21 DIAGNOSIS — Z87891 Personal history of nicotine dependence: Secondary | ICD-10-CM

## 2023-04-21 DIAGNOSIS — Z974 Presence of external hearing-aid: Secondary | ICD-10-CM

## 2023-04-21 DIAGNOSIS — G959 Disease of spinal cord, unspecified: Secondary | ICD-10-CM | POA: Diagnosis present

## 2023-04-21 DIAGNOSIS — Z950 Presence of cardiac pacemaker: Secondary | ICD-10-CM

## 2023-04-21 DIAGNOSIS — Z888 Allergy status to other drugs, medicaments and biological substances status: Secondary | ICD-10-CM

## 2023-04-21 DIAGNOSIS — Z951 Presence of aortocoronary bypass graft: Secondary | ICD-10-CM

## 2023-04-21 DIAGNOSIS — G4733 Obstructive sleep apnea (adult) (pediatric): Secondary | ICD-10-CM | POA: Diagnosis present

## 2023-04-21 HISTORY — DX: Other long term (current) drug therapy: Z79.899

## 2023-04-21 HISTORY — DX: Long term (current) use of anticoagulants: Z79.01

## 2023-04-21 HISTORY — DX: Other specified heart block: I45.5

## 2023-04-21 HISTORY — DX: Other spondylosis with myelopathy, cervical region: M47.12

## 2023-04-21 HISTORY — DX: Benign prostatic hyperplasia without lower urinary tract symptoms: N40.0

## 2023-04-21 SURGERY — CERVICAL LAMINOPLASTY
Anesthesia: General

## 2023-04-21 MED ORDER — OXYCODONE HCL 5 MG PO TABS
5.0000 mg | ORAL_TABLET | Freq: Once | ORAL | Status: AC | PRN
  Administered 2023-04-21: 5 mg via ORAL

## 2023-04-21 MED ORDER — ACETAMINOPHEN 500 MG PO TABS: 1000.0000 mg | ORAL_TABLET | Freq: Four times a day (QID) | ORAL | Status: DC

## 2023-04-21 MED ORDER — MORPHINE SULFATE (PF) 4 MG/ML IV SOLN: 1.0000 mg | INTRAVENOUS | Status: AC | PRN

## 2023-04-21 MED ORDER — FENTANYL CITRATE (PF) 100 MCG/2ML IJ SOLN
INTRAMUSCULAR | Status: DC | PRN
  Administered 2023-04-21: 50 ug via INTRAVENOUS

## 2023-04-21 MED ORDER — CEFAZOLIN SODIUM-DEXTROSE 2-4 GM/100ML-% IV SOLN
2.0000 g | Freq: Once | INTRAVENOUS | Status: AC
  Administered 2023-04-21: 2 g via INTRAVENOUS

## 2023-04-21 MED ORDER — LORATADINE 10 MG PO TABS
10.0000 mg | ORAL_TABLET | Freq: Every day | ORAL | Status: DC
  Administered 2023-04-24 – 2023-04-25 (×2): 10 mg via ORAL
  Filled 2023-04-21: qty 1

## 2023-04-21 MED ORDER — REMIFENTANIL HCL 1 MG IV SOLR
INTRAVENOUS | Status: DC | PRN
Start: 2023-04-21 — End: 2023-04-21
  Administered 2023-04-21: .1 ug/kg/min via INTRAVENOUS

## 2023-04-21 MED ORDER — ALBUMIN HUMAN 5 % IV SOLN
INTRAVENOUS | Status: DC | PRN
Start: 2023-04-21 — End: 2023-04-21

## 2023-04-21 MED ORDER — METHYLPREDNISOLONE ACETATE 40 MG/ML IJ SUSP
INTRAMUSCULAR | Status: AC
  Filled 2023-04-21: qty 1

## 2023-04-21 MED ORDER — PHENOL 1.4 % MT LIQD: 1.0000 | OROMUCOSAL | Status: DC | PRN

## 2023-04-21 MED ORDER — POLYETHYLENE GLYCOL 3350 17 G PO PACK: 17.0000 g | PACK | Freq: Every day | ORAL | Status: DC | PRN

## 2023-04-21 MED ORDER — SUCCINYLCHOLINE CHLORIDE 200 MG/10ML IV SOSY
PREFILLED_SYRINGE | INTRAVENOUS | Status: AC
  Filled 2023-04-21: qty 10

## 2023-04-21 MED ORDER — MAGNESIUM CITRATE PO SOLN: 1.0000 | Freq: Once | ORAL | Status: DC | PRN

## 2023-04-21 MED ORDER — ONDANSETRON HCL 4 MG/2ML IJ SOLN
INTRAMUSCULAR | Status: DC | PRN
  Administered 2023-04-21 (×2): 4 mg via INTRAVENOUS

## 2023-04-21 MED ORDER — SODIUM CHLORIDE FLUSH 0.9 % IV SOLN
INTRAVENOUS | Status: AC
  Filled 2023-04-21: qty 20

## 2023-04-21 MED ORDER — OXYCODONE HCL 5 MG PO TABS
ORAL_TABLET | ORAL | Status: AC
  Filled 2023-04-21: qty 1

## 2023-04-21 MED ORDER — MOMETASONE FURO-FORMOTEROL FUM 100-5 MCG/ACT IN AERO: 2.0000 | INHALATION_SPRAY | Freq: Two times a day (BID) | RESPIRATORY_TRACT | Status: DC

## 2023-04-21 MED ORDER — UMECLIDINIUM BROMIDE 62.5 MCG/ACT IN AEPB
1.0000 | INHALATION_SPRAY | Freq: Every day | RESPIRATORY_TRACT | Status: DC
  Administered 2023-04-24 – 2023-04-25 (×2): 1 via RESPIRATORY_TRACT
  Filled 2023-04-21 (×2): qty 7

## 2023-04-21 MED ORDER — EPHEDRINE SULFATE (PRESSORS) 50 MG/ML IJ SOLN
INTRAMUSCULAR | Status: DC | PRN
  Administered 2023-04-21 (×2): 10 mg via INTRAVENOUS
  Administered 2023-04-21: 5 mg via INTRAVENOUS

## 2023-04-21 MED ORDER — DEXAMETHASONE SODIUM PHOSPHATE 10 MG/ML IJ SOLN
INTRAMUSCULAR | Status: AC
  Filled 2023-04-21: qty 1

## 2023-04-21 MED ORDER — SODIUM CHLORIDE 0.9% FLUSH: 3.0000 mL | INTRAVENOUS | Status: DC | PRN

## 2023-04-21 MED ORDER — SODIUM CHLORIDE 0.9 % IV SOLN: 250.0000 mL | INTRAVENOUS | Status: DC

## 2023-04-21 MED ORDER — METHOCARBAMOL 500 MG PO TABS
500.0000 mg | ORAL_TABLET | Freq: Four times a day (QID) | ORAL | Status: DC | PRN
  Administered 2023-04-22 – 2023-04-24 (×6): 500 mg via ORAL
  Filled 2023-04-21: qty 1

## 2023-04-21 MED ORDER — OXYCODONE HCL 5 MG/5ML PO SOLN: 5.0000 mg | Freq: Once | ORAL | Status: AC | PRN

## 2023-04-21 MED ORDER — ALBUTEROL SULFATE (2.5 MG/3ML) 0.083% IN NEBU: 2.5000 mg | INHALATION_SOLUTION | Freq: Four times a day (QID) | RESPIRATORY_TRACT | Status: DC | PRN

## 2023-04-21 MED ORDER — HYDROMORPHONE HCL 1 MG/ML IJ SOLN
INTRAMUSCULAR | Status: DC | PRN
Start: 2023-04-21 — End: 2023-04-21
  Administered 2023-04-21: 1 mg via INTRAVENOUS

## 2023-04-21 MED ORDER — ACETAMINOPHEN 10 MG/ML IV SOLN
INTRAVENOUS | Status: AC
  Filled 2023-04-21: qty 100

## 2023-04-21 MED ORDER — ACETAMINOPHEN 500 MG PO TABS
1000.0000 mg | ORAL_TABLET | Freq: Four times a day (QID) | ORAL | Status: DC
  Administered 2023-04-21 – 2023-04-25 (×13): 1000 mg via ORAL
  Filled 2023-04-21 (×4): qty 2

## 2023-04-21 MED ORDER — EZETIMIBE 10 MG PO TABS
10.0000 mg | ORAL_TABLET | ORAL | Status: DC
  Administered 2023-04-22 – 2023-04-25 (×3): 10 mg via ORAL
  Filled 2023-04-21: qty 1

## 2023-04-21 MED ORDER — ORAL CARE MOUTH RINSE: 15.0000 mL | Freq: Once | OROMUCOSAL | Status: AC

## 2023-04-21 MED ORDER — METHOCARBAMOL 1000 MG/10ML IJ SOLN: 500.0000 mg | Freq: Four times a day (QID) | INTRAVENOUS | Status: DC | PRN

## 2023-04-21 MED ORDER — SODIUM CHLORIDE (PF) 0.9 % IJ SOLN
INTRAMUSCULAR | Status: DC | PRN
  Administered 2023-04-21: 60 mL via INTRAMUSCULAR

## 2023-04-21 MED ORDER — ACETAMINOPHEN 500 MG PO TABS
ORAL_TABLET | ORAL | Status: AC
  Filled 2023-04-21: qty 2

## 2023-04-21 MED ORDER — SENNA 8.6 MG PO TABS
ORAL_TABLET | ORAL | Status: AC
  Filled 2023-04-21: qty 1

## 2023-04-21 MED ORDER — CHLORHEXIDINE GLUCONATE 0.12 % MT SOLN
15.0000 mL | Freq: Once | OROMUCOSAL | Status: AC
  Administered 2023-04-21: 15 mL via OROMUCOSAL

## 2023-04-21 MED ORDER — MENTHOL 3 MG MT LOZG: 1.0000 | LOZENGE | OROMUCOSAL | Status: DC | PRN

## 2023-04-21 MED ORDER — DOCUSATE SODIUM 100 MG PO CAPS
100.0000 mg | ORAL_CAPSULE | Freq: Two times a day (BID) | ORAL | Status: DC
  Administered 2023-04-21 – 2023-04-25 (×8): 100 mg via ORAL
  Filled 2023-04-21 (×2): qty 1

## 2023-04-21 MED ORDER — KETOROLAC TROMETHAMINE 15 MG/ML IJ SOLN
7.5000 mg | Freq: Four times a day (QID) | INTRAMUSCULAR | Status: AC
  Administered 2023-04-21 – 2023-04-22 (×4): 7.5 mg via INTRAVENOUS

## 2023-04-21 MED ORDER — TIOTROPIUM BROMIDE MONOHYDRATE 2.5 MCG/ACT IN AERS: 2.0000 | INHALATION_SPRAY | Freq: Every day | RESPIRATORY_TRACT | Status: DC

## 2023-04-21 MED ORDER — AZELASTINE HCL 0.1 % NA SOLN
2.0000 | Freq: Two times a day (BID) | NASAL | Status: DC
  Administered 2023-04-21 – 2023-04-25 (×8): 2 via NASAL
  Filled 2023-04-21: qty 30

## 2023-04-21 MED ORDER — VASOPRESSIN 20 UNIT/ML IV SOLN
INTRAVENOUS | Status: DC | PRN
Start: 2023-04-21 — End: 2023-04-21
  Administered 2023-04-21 (×2): 2 [IU] via INTRAVENOUS

## 2023-04-21 MED ORDER — CEFAZOLIN SODIUM-DEXTROSE 2-4 GM/100ML-% IV SOLN
INTRAVENOUS | Status: AC
  Filled 2023-04-21: qty 100

## 2023-04-21 MED ORDER — CHLORHEXIDINE GLUCONATE 0.12 % MT SOLN
OROMUCOSAL | Status: AC
  Filled 2023-04-21: qty 15

## 2023-04-21 MED ORDER — GLYCOPYRROLATE 0.2 MG/ML IJ SOLN
INTRAMUSCULAR | Status: DC | PRN
  Administered 2023-04-21: .2 mg via INTRAVENOUS

## 2023-04-21 MED ORDER — IRRISEPT - 450ML BOTTLE WITH 0.05% CHG IN STERILE WATER, USP 99.95% OPTIME
TOPICAL | Status: DC | PRN
Start: 2023-04-21 — End: 2023-04-21
  Administered 2023-04-21: 450 mL

## 2023-04-21 MED ORDER — KETOROLAC TROMETHAMINE 15 MG/ML IJ SOLN
INTRAMUSCULAR | Status: AC
  Filled 2023-04-21: qty 1

## 2023-04-21 MED ORDER — HYDROMORPHONE HCL 1 MG/ML IJ SOLN
INTRAMUSCULAR | Status: AC
  Filled 2023-04-21: qty 1

## 2023-04-21 MED ORDER — ACALABRUTINIB MALEATE 100 MG PO TABS: 100.0000 mg | ORAL_TABLET | Freq: Two times a day (BID) | ORAL | Status: DC

## 2023-04-21 MED ORDER — PHENYLEPHRINE HCL-NACL 20-0.9 MG/250ML-% IV SOLN
INTRAVENOUS | Status: DC | PRN
Start: 2023-04-21 — End: 2023-04-21
  Administered 2023-04-21: 160 ug via INTRAVENOUS
  Administered 2023-04-21: 30 ug/min via INTRAVENOUS

## 2023-04-21 MED ORDER — PHENYLEPHRINE 80 MCG/ML (10ML) SYRINGE FOR IV PUSH (FOR BLOOD PRESSURE SUPPORT)
PREFILLED_SYRINGE | INTRAVENOUS | Status: DC | PRN
  Administered 2023-04-21: 160 ug via INTRAVENOUS
  Administered 2023-04-21: 80 ug via INTRAVENOUS

## 2023-04-21 MED ORDER — MOMETASONE FURO-FORMOTEROL FUM 200-5 MCG/ACT IN AERO
2.0000 | INHALATION_SPRAY | Freq: Two times a day (BID) | RESPIRATORY_TRACT | Status: DC
  Administered 2023-04-21 – 2023-04-25 (×8): 2 via RESPIRATORY_TRACT
  Filled 2023-04-21: qty 8.8

## 2023-04-21 MED ORDER — PANTOPRAZOLE SODIUM 40 MG PO TBEC
40.0000 mg | DELAYED_RELEASE_TABLET | Freq: Every day | ORAL | Status: DC
  Administered 2023-04-22 – 2023-04-25 (×4): 40 mg via ORAL
  Filled 2023-04-21: qty 1

## 2023-04-21 MED ORDER — ALBUMIN HUMAN 5 % IV SOLN
INTRAVENOUS | Status: AC
  Filled 2023-04-21: qty 250

## 2023-04-21 MED ORDER — BISACODYL 10 MG RE SUPP: 10.0000 mg | Freq: Every day | RECTAL | Status: DC | PRN

## 2023-04-21 MED ORDER — OXYCODONE HCL 5 MG PO TABS
5.0000 mg | ORAL_TABLET | ORAL | Status: DC | PRN
  Administered 2023-04-21 – 2023-04-24 (×9): 5 mg via ORAL

## 2023-04-21 MED ORDER — DEXMEDETOMIDINE HCL IN NACL 80 MCG/20ML IV SOLN
INTRAVENOUS | Status: AC
  Filled 2023-04-21: qty 20

## 2023-04-21 MED ORDER — AMIODARONE HCL 200 MG PO TABS
100.0000 mg | ORAL_TABLET | ORAL | Status: DC
  Administered 2023-04-21 – 2023-04-25 (×6): 100 mg via ORAL
  Filled 2023-04-21 (×2): qty 1

## 2023-04-21 MED ORDER — OXYCODONE HCL 5 MG PO TABS
10.0000 mg | ORAL_TABLET | ORAL | Status: DC | PRN
  Administered 2023-04-24 – 2023-04-25 (×2): 10 mg via ORAL
  Filled 2023-04-21 (×2): qty 2

## 2023-04-21 MED ORDER — SURGIFLO WITH THROMBIN (HEMOSTATIC MATRIX KIT) OPTIME
TOPICAL | Status: DC | PRN
  Administered 2023-04-21 (×4): 1 via TOPICAL

## 2023-04-21 MED ORDER — FENTANYL CITRATE (PF) 100 MCG/2ML IJ SOLN
INTRAMUSCULAR | Status: AC
  Filled 2023-04-21: qty 2

## 2023-04-21 MED ORDER — LACTATED RINGERS IV SOLN: INTRAVENOUS | Status: DC

## 2023-04-21 MED ORDER — REMIFENTANIL HCL 1 MG IV SOLR
INTRAVENOUS | Status: AC
  Filled 2023-04-21: qty 1000

## 2023-04-21 MED ORDER — FERROUS SULFATE 325 (65 FE) MG PO TABS
325.0000 mg | ORAL_TABLET | Freq: Every day | ORAL | Status: DC
  Administered 2023-04-22 – 2023-04-25 (×4): 325 mg via ORAL
  Filled 2023-04-21: qty 1

## 2023-04-21 MED ORDER — BUPIVACAINE HCL (PF) 0.5 % IJ SOLN
INTRAMUSCULAR | Status: AC
  Filled 2023-04-21: qty 30

## 2023-04-21 MED ORDER — ONDANSETRON HCL 4 MG PO TABS: 4.0000 mg | ORAL_TABLET | Freq: Four times a day (QID) | ORAL | Status: DC | PRN

## 2023-04-21 MED ORDER — ONDANSETRON HCL 4 MG/2ML IJ SOLN: 4.0000 mg | Freq: Once | INTRAMUSCULAR | Status: DC | PRN

## 2023-04-21 MED ORDER — FENTANYL CITRATE (PF) 100 MCG/2ML IJ SOLN: 25.0000 ug | INTRAMUSCULAR | Status: DC | PRN

## 2023-04-21 MED ORDER — ACETAMINOPHEN 10 MG/ML IV SOLN
INTRAVENOUS | Status: DC | PRN
  Administered 2023-04-21: 1000 mg via INTRAVENOUS

## 2023-04-21 MED ORDER — BUPIVACAINE-EPINEPHRINE (PF) 0.5% -1:200000 IJ SOLN
INTRAMUSCULAR | Status: DC | PRN
  Administered 2023-04-21: 10 mL via PERINEURAL

## 2023-04-21 MED ORDER — AMIODARONE HCL 200 MG PO TABS
ORAL_TABLET | ORAL | Status: AC
  Filled 2023-04-21: qty 1

## 2023-04-21 MED ORDER — BUPIVACAINE-EPINEPHRINE (PF) 0.5% -1:200000 IJ SOLN
INTRAMUSCULAR | Status: AC
  Filled 2023-04-21: qty 10

## 2023-04-21 MED ORDER — DEXAMETHASONE SODIUM PHOSPHATE 10 MG/ML IJ SOLN
INTRAMUSCULAR | Status: DC | PRN
  Administered 2023-04-21: 10 mg via INTRAVENOUS

## 2023-04-21 MED ORDER — PROPOFOL 10 MG/ML IV BOLUS
INTRAVENOUS | Status: DC | PRN
  Administered 2023-04-21: 115 ug/kg/min via INTRAVENOUS
  Administered 2023-04-21: 150 ug/kg/min via INTRAVENOUS

## 2023-04-21 MED ORDER — 0.9 % SODIUM CHLORIDE (POUR BTL) OPTIME
TOPICAL | Status: DC | PRN
  Administered 2023-04-21: 1000 mL

## 2023-04-21 MED ORDER — ACETAMINOPHEN 650 MG RE SUPP: 650.0000 mg | RECTAL | Status: DC | PRN

## 2023-04-21 MED ORDER — DEXMEDETOMIDINE HCL IN NACL 80 MCG/20ML IV SOLN
INTRAVENOUS | Status: DC | PRN
Start: 2023-04-21 — End: 2023-04-21
  Administered 2023-04-21: 8 ug via INTRAVENOUS
  Administered 2023-04-21: 4 ug via INTRAVENOUS

## 2023-04-21 MED ORDER — GLYCOPYRROLATE 0.2 MG/ML IJ SOLN
INTRAMUSCULAR | Status: AC
  Filled 2023-04-21: qty 1

## 2023-04-21 MED ORDER — MIRTAZAPINE 15 MG PO TABS: 15.0000 mg | ORAL_TABLET | Freq: Every evening | ORAL | Status: DC | PRN

## 2023-04-21 MED ORDER — BUPIVACAINE LIPOSOME 1.3 % IJ SUSP
INTRAMUSCULAR | Status: AC
  Filled 2023-04-21: qty 20

## 2023-04-21 MED ORDER — DOCUSATE SODIUM 100 MG PO CAPS
ORAL_CAPSULE | ORAL | Status: AC
  Filled 2023-04-21: qty 1

## 2023-04-21 MED ORDER — SENNA 8.6 MG PO TABS
1.0000 | ORAL_TABLET | Freq: Two times a day (BID) | ORAL | Status: DC
  Administered 2023-04-21 – 2023-04-25 (×8): 8.6 mg via ORAL
  Filled 2023-04-21 (×2): qty 1

## 2023-04-21 MED ORDER — ONDANSETRON HCL 4 MG/2ML IJ SOLN: 4.0000 mg | Freq: Four times a day (QID) | INTRAMUSCULAR | Status: DC | PRN

## 2023-04-21 MED ORDER — SODIUM CHLORIDE 0.9 % IV SOLN: INTRAVENOUS | Status: DC

## 2023-04-21 MED ORDER — TAMSULOSIN HCL 0.4 MG PO CAPS
ORAL_CAPSULE | ORAL | Status: AC
  Filled 2023-04-21: qty 1

## 2023-04-21 MED ORDER — ACETAMINOPHEN 325 MG PO TABS: 650.0000 mg | ORAL_TABLET | ORAL | Status: DC | PRN

## 2023-04-21 MED ORDER — ENOXAPARIN SODIUM 40 MG/0.4ML IJ SOSY
40.0000 mg | PREFILLED_SYRINGE | INTRAMUSCULAR | Status: DC
  Administered 2023-04-22 – 2023-04-25 (×4): 40 mg via SUBCUTANEOUS
  Filled 2023-04-21: qty 0.4

## 2023-04-21 MED ORDER — ONDANSETRON HCL 4 MG/2ML IJ SOLN
INTRAMUSCULAR | Status: AC
  Filled 2023-04-21: qty 2

## 2023-04-21 MED ORDER — LIDOCAINE HCL (CARDIAC) PF 100 MG/5ML IV SOSY
PREFILLED_SYRINGE | INTRAVENOUS | Status: DC | PRN
  Administered 2023-04-21: 100 mg via INTRAVENOUS

## 2023-04-21 MED ORDER — SODIUM CHLORIDE 0.9% FLUSH
3.0000 mL | Freq: Two times a day (BID) | INTRAVENOUS | Status: DC
  Administered 2023-04-21 – 2023-04-25 (×6): 3 mL via INTRAVENOUS

## 2023-04-21 MED ORDER — SUCCINYLCHOLINE CHLORIDE 200 MG/10ML IV SOSY
PREFILLED_SYRINGE | INTRAVENOUS | Status: DC | PRN
  Administered 2023-04-21: 100 mg via INTRAVENOUS

## 2023-04-21 MED ORDER — TAMSULOSIN HCL 0.4 MG PO CAPS
0.4000 mg | ORAL_CAPSULE | Freq: Every evening | ORAL | Status: DC
  Administered 2023-04-21 – 2023-04-24 (×4): 0.4 mg via ORAL
  Filled 2023-04-21: qty 1

## 2023-04-21 MED ORDER — ACETAMINOPHEN 10 MG/ML IV SOLN: 1000.0000 mg | Freq: Once | INTRAVENOUS | Status: DC | PRN

## 2023-04-21 SURGICAL SUPPLY — 59 items
ADH SKN CLS APL DERMABOND .7 (GAUZE/BANDAGES/DRESSINGS) ×1
AGENT HMST KT MTR STRL THRMB (HEMOSTASIS) ×4
APL PRP STRL LF DISP 70% ISPRP (MISCELLANEOUS) ×1
BASIN KIT SINGLE STR (MISCELLANEOUS) ×1 IMPLANT
BIT DRILL CANOPY FLUTED 1.4 (BIT) IMPLANT
BUR NEURO DRILL SOFT 3.0X3.8M (BURR) ×1 IMPLANT
CHLORAPREP W/TINT 26 (MISCELLANEOUS) ×1 IMPLANT
DEPTH SLEEVE 6 (SLEEVE) ×1
DERMABOND ADVANCED .7 DNX12 (GAUZE/BANDAGES/DRESSINGS) ×1 IMPLANT
DRAPE C ARM PK CFD 31 SPINE (DRAPES) ×1 IMPLANT
DRAPE LAPAROTOMY 100X77 ABD (DRAPES) ×1 IMPLANT
DRAPE MICROSCOPE SPINE 48X150 (DRAPES) ×1 IMPLANT
DRSG OPSITE POSTOP 3X4 (GAUZE/BANDAGES/DRESSINGS) ×1 IMPLANT
DRSG OPSITE POSTOP 4X8 (GAUZE/BANDAGES/DRESSINGS) IMPLANT
DRSG TEGADERM 4X4.75 (GAUZE/BANDAGES/DRESSINGS) IMPLANT
ELECT CAUTERY BLADE TIP 2.5 (TIP)
ELECTRODE CAUTERY BLDE TIP 2.5 (TIP) IMPLANT
EVACUATOR 1/8 PVC DRAIN (DRAIN) IMPLANT
FEE INTRAOP CADWELL SUPPLY NCS (MISCELLANEOUS) IMPLANT
FEE INTRAOP MONITOR IMPULS NCS (MISCELLANEOUS) IMPLANT
GAUZE SPONGE 2X2 STRL 8-PLY (GAUZE/BANDAGES/DRESSINGS) IMPLANT
GLOVE BIOGEL PI IND STRL 6.5 (GLOVE) ×1 IMPLANT
GLOVE BIOGEL PI IND STRL 8 (GLOVE) ×1 IMPLANT
GLOVE PROTEXIS LATEX SZ 7.5 (GLOVE) ×1
GLOVE SURG LATEX 7.5 PF (GLOVE) ×1 IMPLANT
GLOVE SURG SYN 6.5 ES PF (GLOVE) ×1
GLOVE SURG SYN 6.5 PF PI (GLOVE) ×1 IMPLANT
GOWN SRG LRG LVL 4 IMPRV REINF (GOWNS) ×1 IMPLANT
GOWN SRG XL LVL 3 NONREINFORCE (GOWNS) ×1 IMPLANT
GOWN STRL NON-REIN TWL XL LVL3 (GOWNS) ×1
GOWN STRL REIN LRG LVL4 (GOWNS) ×1
INTRAOP CADWELL SUPPLY FEE NCS (MISCELLANEOUS) ×1
INTRAOP DISP SUPPLY FEE NCS (MISCELLANEOUS) ×1
INTRAOP MONITOR FEE IMPULS NCS (MISCELLANEOUS) ×1
JET LAVAGE IRRISEPT WOUND (IRRIGATION / IRRIGATOR) ×1
KIT PREVENA INCISION MGT 13 (CANNISTER) ×1 IMPLANT
KIT SPINAL PRONEVIEW (KITS) ×1 IMPLANT
LAVAGE JET IRRISEPT WOUND (IRRIGATION / IRRIGATOR) ×1 IMPLANT
MANIFOLD NEPTUNE II (INSTRUMENTS) ×1 IMPLANT
MARKER SKIN DUAL TIP RULER LAB (MISCELLANEOUS) ×1 IMPLANT
NDL SAFETY ECLIP 18X1.5 (MISCELLANEOUS) ×1 IMPLANT
NS IRRIG 1000ML POUR BTL (IV SOLUTION) ×1 IMPLANT
PACK LAMINECTOMY ARMC (PACKS) ×1 IMPLANT
PAD ARMBOARD 7.5X6 YLW CONV (MISCELLANEOUS) ×1 IMPLANT
PLATE CANOPY SHELF INLINE 5 (Plate) IMPLANT
SCREW SELF DRILL RELIEVE 6 (Screw) IMPLANT
SLEEVE DEPTH 6 (SLEEVE) IMPLANT
STAPLER SKIN PROX 35W (STAPLE) ×2 IMPLANT
SURGIFLO W/THROMBIN 8M KIT (HEMOSTASIS) ×1 IMPLANT
SUT DVC VLOC 3-0 CL 6 P-12 (SUTURE) IMPLANT
SUT ETHILON 3-0 (SUTURE) IMPLANT
SUT VIC AB 0 CT1 27 (SUTURE) ×2
SUT VIC AB 0 CT1 27XCR 8 STRN (SUTURE) IMPLANT
SUT VIC AB 2-0 CT1 18 (SUTURE) ×1 IMPLANT
SYR 30ML LL (SYRINGE) IMPLANT
TAPE CLOTH 3X10 WHT NS LF (GAUZE/BANDAGES/DRESSINGS) ×2 IMPLANT
TOWEL OR 17X26 4PK STRL BLUE (TOWEL DISPOSABLE) ×2 IMPLANT
TRAP FLUID SMOKE EVACUATOR (MISCELLANEOUS) ×1 IMPLANT
WATER STERILE IRR 1000ML POUR (IV SOLUTION) ×2 IMPLANT

## 2023-04-21 NOTE — Anesthesia Postprocedure Evaluation (Signed)
Anesthesia Post Note  Patient: [name]  Procedure(s) Performed: C3-T1 LAMINOPLASTY  Patient location during evaluation: PACU Anesthesia Type: General Level of consciousness: awake and alert, oriented and patient cooperative Pain management: pain level controlled Vital Signs Assessment: post-procedure vital signs reviewed and stable Respiratory status: spontaneous breathing, nonlabored ventilation and respiratory function stable Cardiovascular status: blood pressure returned to baseline and stable Postop Assessment: adequate PO intake Anesthetic complications: no   No notable events documented.   Last Vitals:  Vitals:   04/21/23 1445 04/21/23 1500  BP: (!) 94/57 (!) 100/59  Pulse: 81 81  Resp: 20 (!) 21  Temp:  37.2 C  SpO2: 99% 99%    Last Pain:  Vitals:   04/21/23 1500  TempSrc:   PainSc: Asleep                 Reed Breech

## 2023-04-21 NOTE — Transfer of Care (Signed)
Immediate Anesthesia Transfer of Care Note  Patient: [name]  Procedure(s) Performed: C3-T1 LAMINOPLASTY  Patient Location: PACU  Anesthesia Type:General  Level of Consciousness: drowsy and patient cooperative  Airway & Oxygen Therapy: Patient Spontanous Breathing and Patient connected to face mask oxygen  Post-op Assessment: Report given to RN and Post -op Vital signs reviewed and stable  Post vital signs: Reviewed and stable  Last Vitals:  Vitals Value Taken Time  BP 93/49 04/21/23 1421  Temp 36.5 C 04/21/23 1421  Pulse 64 04/21/23 1430  Resp 12 04/21/23 1430  SpO2 100 % 04/21/23 1430  Vitals shown include unfiled device data.  Last Pain:  Vitals:   04/21/23 1430  TempSrc:   PainSc: Asleep         Complications: No notable events documented.

## 2023-04-21 NOTE — Op Note (Signed)
Indications: [name] is a 77 yo male who presented with cervical myelopathy with progressive symptoms prompting surgical intervention.  Findings: cervical stenosis  Preoperative Diagnosis:  Hospital Problem List as of 04/21/2023          Priority Resolved POA     Unprioritized     * (Principal) Spondylosis, cervical, with myelopathy   Yes     Cervical myelopathy (HCC)   Yes   Postoperative Diagnosis: same   EBL: 1200 ml IVF: see anesthesia record Drains: 1 placed Disposition: Extubated and Stable to PACU Complications: none  No foley catheter was placed.   Preoperative Note: Patient was seen in the preoperative area.  He continued to have cervical myelopathy that was progressive.  He has since recovered from his COVID infection.  Given his progressive symptoms we planned for cervical laminoplasty  Risks of surgery discussed include: infection, bleeding, stroke, coma, death, paralysis, CSF leak, nerve/spinal cord injury, numbness, tingling, weakness, complex regional pain syndrome, recurrent stenosis and/or disc herniation, vascular injury, development of instability, neck/back pain, need for further surgery, persistent symptoms, development of deformity, and the risks of anesthesia. The patient understood these risks and agreed to proceed.  Operative Note:   OPERATIVE PROCEDURE:  1. Posterior Cervical Laminoplasty C3-T1 2. Use of flouroscopy   OPERATIVE PROCEDURE:  After induction of general anesthesia the patient was placed prone on the [place] frame. A midline incision was then planned using fluoroscopy.  A timeout was performed, and antibiotics given.  Next, the posterior cervical region was prepped and draped in the usual sterile fashion. The incision was injected with local anesthetic, the opened sharply. A subperiosteal dissection was then carried out to expose the remaining posterior elements from C3 and T1, with careful attention paid to maintaining the facet  capsules.  After satisfactory exposure had been obtained, the high speed drill was used to drill a partial thickness cut in the lamina from C3 to T1 on the right, and a full thickness cut from C3- T1 on the left.  We then mobilized the lamina to expand the spinal canal.  We utilized a Kerrison rongeur to remove the lateral ligamentum to help with further mobilization of the posterior elements.  Utilized expandable coagulant to help with blood loss. we sized the space on the left, then placed laminoplasty plates at each level from C3- T1.  Screws were placed into the lateral mass and lamina at each level to hold the laminoplasty plates in position  The wound was copiously irrigated with bacitracin-containing solution and hemostasis was achieved.    A Hemovac drain was then placed in the wound deep to the fascia.   The wound was closed in a multilayer fashion using interrupted 0 and 2-0 Vicryl sutures.  The final skin edges were reapproximated using a 3-0 monocryl.  After closure, the patient was flipped supine and the [place] removed.  Patient was then handed back over to anesthesia.  All counts were correct at the conclusion of the procedure.  Neurological monitoring was used throughout, and there were no changes.  Manning Charity PA acted as an Designer, television/film set throughout the case.   Lovenia Kim, MD

## 2023-04-21 NOTE — Anesthesia Procedure Notes (Signed)
Procedure Name: Intubation Date/Time: 04/21/2023 10:49 AM  Performed by: Mohammed Kindle, CRNAPre-anesthesia Checklist: Patient identified, Emergency Drugs available, Suction available and Patient being monitored Patient Re-evaluated:Patient Re-evaluated prior to induction Oxygen Delivery Method: Circle system utilized Preoxygenation: Pre-oxygenation with 100% oxygen Induction Type: IV induction Ventilation: Mask ventilation without difficulty Laryngoscope Size: McGraph and 4 Grade View: Grade I Tube type: Oral Tube size: 7.0 mm Number of attempts: 1 Airway Equipment and Method: Stylet and Oral airway Placement Confirmation: ETT inserted through vocal cords under direct vision, positive ETCO2 and breath sounds checked- equal and bilateral Secured at: 21 cm Tube secured with: Tape Dental Injury: Teeth and Oropharynx as per pre-operative assessment

## 2023-04-21 NOTE — Anesthesia Preprocedure Evaluation (Addendum)
Anesthesia Evaluation  Patient identified by MRN, date of birth, ID band Patient awake    Reviewed: Allergy & Precautions, NPO status , Patient's Chart, lab work & pertinent test results  History of Anesthesia Complications Negative for: history of anesthetic complications  Airway Mallampati: II   Neck ROM: Full    Dental  (+) Edentulous Upper, Edentulous Lower   Pulmonary sleep apnea and Continuous Positive Airway Pressure Ventilation , COPD, former smoker (quit 2008)   Pulmonary exam normal breath sounds clear to auscultation       Cardiovascular hypertension, + CAD (s/p MI and CABG), + Peripheral Vascular Disease and +CHF  Normal cardiovascular exam+ pacemaker (SSS)  Rhythm:Regular Rate:Normal  ECG 04/17/23: Atrial-paced rhythm with prolonged AV conduction Nonspecific ST and T wave abnormality   TTE, 02/19/2021  1. Left ventricular ejection fraction, by estimation, is 55 to 60%. The left ventricle has normal function. The left ventricle has no regional wall motion abnormalities. Left ventricular diastolic parameters are consistent with Grade I diastolic dysfunction (impaired relaxation).   2. Right ventricular systolic function is mildly reduced. The right ventricular size is normal. Mildly increased right ventricular wall thickness. Tricuspid regurgitation signal is inadequate for assessing PA pressure.   3. The mitral valve was not well visualized. Trivial mitral valve regurgitation. No evidence of mitral stenosis.   4. The aortic valve has an indeterminant number of cusps. There is mild calcification of the aortic valve. There is moderate thickening of the aortic valve. Aortic valve regurgitation is not visualized. Mild to moderate aortic valve sclerosis/calcification is present, without any evidence of aortic stenosis.   5. The inferior vena cava is dilated in size with >50% respiratory variability, suggesting right atrial pressure of  8 mmHg.    R/LHC, 02/04/2021 1. Severe two-vessel coronary artery disease, including chronic total occlusion of the proximal LAD and 90-99% distal RCA/rPLA disease. RPL branches and rPDA fill via left-to-right collaterals. 2. Widely patent LIMA-LAD. 3. Chronically occluded SVG-OM1 and SVG-rPDA. 4. Mildly to moderately reduced left ventricular systolic function (LVEDP 40-45%). 5. Mildly elevated left heart and pulmonary artery pressures. 6. Moderately elevated right heart filling pressures. 7. Normal Fick cardiac output/index.   Neuro/Psych  PSYCHIATRIC DISORDERS (PTSD)      HOH TIACVA (2023), No Residual Symptoms    GI/Hepatic negative GI ROS,,,  Endo/Other  Obesity   Renal/GU negative Renal ROS   BPH    Musculoskeletal  (+) Arthritis ,    Abdominal   Peds  Hematology CLL   Anesthesia Other Findings Reviewed and agree with [name] pre-anesthesia clinical review note.  "Electrophysiology indicating that procedure should will interfere with planned surgical procedure. Due to required positioning (prone) for the procedure, EP recommending reprogramming to asynchronous pacing mode during the procedure, with plans to return to normal programming afterwards. Reached out to [employer] industry representative [name]) who advised that reprogramming is not necessary due to prone positioning. Industry representative indicating that as long as magnet is secured over the device, the device will be in asynchronous mode. Removal of the magnet will result in device reverting to previously programmed settings. Beyond normal hemodynamic monitoring, and the aforementioned use of the magnet, there are no further recommendations from industry representative."   Cardiology note 04/17/23:  ASSESSMENT AND PLAN:   #) SND s/p PPM Device functioning well, see paceart for details Histograms blunted, so RR slightly adjusted   #) Parox AFib No recent afib episodes by device Cont amiodarone 500mg   / week Recent LFTs stable  Recent thyroid labs (via care everywhere) stable   #) Hypercoag d/t parox afib CHA2DS2-VASc Score = 6 [CHF History: 0, HTN History: 1, Diabetes History: 0, Stroke History: 2, Vascular Disease History: 1, Age Score: 2, Gender Score: 0].  Therefore, the patient's annual risk of stroke is 9.7 %.    Stroke ppx - 5mg  eliquis, appropriately dosed No bleeding concerns\  Reproductive/Obstetrics                             Anesthesia Physical Anesthesia Plan  ASA: 3  Anesthesia Plan: General   Post-op Pain Management:    Induction: Intravenous  PONV Risk Score and Plan: 2 and Ondansetron, Dexamethasone and Treatment may vary due to age or medical condition  Airway Management Planned: Oral ETT  Additional Equipment:   Intra-op Plan:   Post-operative Plan: Extubation in OR  Informed Consent: I have reviewed the patients History and Physical, chart, labs and discussed the procedure including the risks, benefits and alternatives for the proposed anesthesia with the patient or authorized representative who has indicated his/her understanding and acceptance.     Dental advisory given  Plan Discussed with: CRNA  Anesthesia Plan Comments: (Patient consented for risks of anesthesia including but not limited to:  - adverse reactions to medications - damage to eyes, teeth, lips or other oral mucosa - nerve damage due to positioning  - sore throat or hoarseness - damage to heart, brain, nerves, lungs, other parts of body or loss of life  Informed patient about role of CRNA in peri- and intra-operative care.  Patient voiced understanding.  Note: called EP representative at [facility] on 04/21/23 and asked her to reprogram the device; she said she would not reprogram due to having to return after procedure to program the device. I mentioned possible pressure injury due to prone positioning, and the EP representative still said she would not perform  the service.  I will place a magnet and have notified the patient of possible pressure injury during procedure; he is aware and wishes to proceed.)        Anesthesia Quick Evaluation

## 2023-04-21 NOTE — Plan of Care (Signed)
  Problem: Pain Management: Goal: Pain level will decrease Outcome: Progressing   Problem: Skin Integrity: Goal: Will show signs of wound healing Outcome: Progressing   

## 2023-04-21 NOTE — H&P (Signed)
Referring Physician:  No referring provider defined for this encounter.  Primary Physician:  Glori Luis, MD  History of Present Illness: 04/21/2023 [name] is here today with a chief complaint of neck pain that occasionally goes into the bilateral shoulders.  What he has noticed that has been most debilitating has been his difficulty with his balance and ambulation.  He was previously seen in our outpatient clinic and was found to have significant myelopathic signs and symptoms.  His MRI demonstrated significant stenosis.  He states this has been going on progressively worse over the past 3 months but has been dealing with it for many years.  He states that the pain can get up to a 9 out of 10 this is mostly the pain radiating into his shoulders he has noticed a decreased ability to rotate his neck.  He also feels that this gets worse while he is walking.  He does not notice significant improvement.  Not having any bowel or bladder issues.  He did have home PT.  He also notes that he has been dropping stuff at a much higher rate than usual.  Conservative measures:  Physical therapy: he was doing home health PT for his gait/balance but has finished this Multimodal medical therapy including regular antiinflammatories: tramadol, tylenol Injections:  has not participated epidural steroid injections  Past Surgery: none  The symptoms are causing a significant impact on the patient's life.   I have utilized the care everywhere function in epic to review the outside records available from external health systems.  Review of Systems:  A 10 point review of systems is negative, except for the pertinent positives and negatives detailed in the HPI.  Past Medical History: Past Medical History:  Diagnosis Date   Agent orange exposure    Anxiety    Arthritis    Atrial fibrillation (HCC) 2013   a.) Dx'd in 2013; recurrence 02/2014; b.) CHA2DS2-VASc = 7 (age x2, HFimpEF, HTN, CVA x  2, previous MI); c.) cardiac rate/rhythm maintained on oral amiodarone; chronically anticoagulated using apixaban   BPH (benign prostatic hyperplasia)    C. 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)    a.) MV 08/23/2015: EF 50%; b.) TTE 08/23/2015: EF 55-60%, G1DD; c.) TTE 11/02/2015: EF 55-60%, mild LA dil; d.) TTE 02/04/2020: EF 55-60%, mild LVH, G1DD, PASP 44.3, mild-mod AoV sclerosis; e.) MV 12/25/2020: EF 45-54%; f.)  R/LHC 02/04/2021: mRA 13, mPA 27, mPCWP 22, LVEDP 23, Ao sat 99, PA sat 70, CO 6.5, CI 3.0; g.) TTE 02/19/2021: EF 55-60%, G1DD, mildly red RVSF, triv MR, mild-mod AoV scler   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 EF 55%, mild antlat HK;  c.) s/p R/LHC 02/04/2021: LM mild diff dzs, LAD 100p, LCX nl, RCA 50p, 95d, 99 side branch, RPAV fills via LCx. SVG->RPDA 100, SVG->OM1 100, LIMA->LAD patent. EF 35-45%.  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    Hyperlipidemia LDL goal <70    Hypertension    Hypokalemia    Ischemic cardiomyopathy    a.) MV 08/23/2015: EF 50%; b.) TTE 08/23/2015: EF 55-60%; c.) TTE 11/02/2015: EF 55-60%; d.) TTE 02/04/2020:  EF 55-60%; e.) MV 12/25/2020: EF 45-54%; f.) R/LHC 02/04/2021: mRA 13, mPA 27, mPCWP 22, LVEDP 23, Ao sat 99, PA sat 70, CO 6.5, CI 3.0; g.) TTE 02/19/2021: EF 55-60%   Ischemic cerebrovascular accident (CVA) (HCC) 10/18/2021   Myocardial infarction (HCC) 2010   On amiodarone therapy    On apixaban therapy    OSA on CPAP    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   TIA (transient ischemic attack) 11/02/2015    Past Surgical History: Past Surgical History:  Procedure Laterality Date   ABDOMINAL HERNIA REPAIR     APPENDECTOMY  06/21/1985   CORONARY ARTERY BYPASS GRAFT N/A 05/09/2009   EP IMPLANTABLE DEVICE N/A 03/03/2016   Procedure: Pacemaker Implant;  Surgeon: Duke Salvia, MD;  Location: [facility] INVASIVE CV LAB;  Service: Cardiovascular;  Laterality: N/A;   FOREIGN BODY REMOVAL  1968   "shrapnel in my tailbone"   INGUINAL HERNIA REPAIR Right    JOINT REPLACEMENT Right 2018   LAPAROSCOPIC CHOLECYSTECTOMY     LEFT HEART CATH AND CORONARY ANGIOGRAPHY Left 05/08/2009   Procedure: LEFT HEART CATH AND CORONARY ANGIOGRAPHY; Location: [facility]: Surgeon: Lorine Bears, MD   LEFT HEART CATH AND CORS/GRAFTS ANGIOGRAPHY Left 10/17/2009   Procedure: LEFT HEART CATH AND CORS/GRAFTS ANGIOGRAPHY; Location: [facility]; Surgeon: Lorine Bears, MD   RIGHT/LEFT HEART CATH AND CORONARY/GRAFT ANGIOGRAPHY N/A 02/04/2021   Procedure: RIGHT/LEFT  HEART CATH AND CORONARY/GRAFT ANGIOGRAPHY;  Surgeon: Yvonne Kendall, MD;  Location: [facility] INVASIVE CV LAB;  Service: Cardiovascular;  Laterality: N/A;   TONSILLECTOMY AND ADENOIDECTOMY  1956   TOTAL HIP ARTHROPLASTY Right 10/22/2016   Procedure: TOTAL HIP ARTHROPLASTY;  Surgeon: Donato Heinz, MD;  Location: [facility] ORS;  Service: Orthopedics;  Laterality: Right;   TOTAL HIP ARTHROPLASTY Left 11/04/2017   Procedure: TOTAL HIP ARTHROPLASTY;  Surgeon: Donato Heinz, MD;  Location: [facility] ORS;  Service: Orthopedics;  Laterality: Left;    Allergies: Allergies as of 03/05/2023 - Review Complete 02/25/2023  Allergen Reaction Noted   Atorvastatin Other (See Comments) 08/25/2022   Augmentin [amoxicillin-pot clavulanate]  04/17/2022    Medications:  Current Facility-Administered Medications:    ceFAZolin (ANCEF) IVPB 2g/100 mL premix, 2 g, Intravenous, Once, Lovenia Kim, MD   lactated ringers infusion, , Intravenous, Continuous, Corinda Gubler, MD, Last Rate: 10 mL/hr at 04/21/23  2841, New Bag at 04/21/23 0953  Facility-Administered Medications Ordered in Other Encounters:    0.9 %  sodium chloride infusion, , Intravenous, Continuous, Borders, Daryl Eastern, NP, Stopped at 03/31/23 1524   potassium chloride 10 mEq in 100 mL IVPB, 10 mEq, Intravenous, Once, Borders, Daryl Eastern, NP  Social History: Social History   Tobacco Use   Smoking status: Former    Current packs/day: 0.00    Average packs/day: 1 pack/day for 40.0 years (40.0 ttl pk-yrs)    Types: Cigarettes    Start date: 07/21/1966    Quit date: 07/21/2006    Years since quitting: 16.7   Smokeless tobacco: Never  Vaping Use   Vaping status: Former  Substance Use Topics   Alcohol use: Not Currently   Drug use: No    Family Medical History: Family History  Problem Relation Age of Onset   Heart disease Mother    Heart attack Mother    Coronary artery disease Other        family history    Physical Examination: Vitals:   04/21/23  0905  BP: (!) 158/73  Pulse: 63  Resp: 20  Temp: (!) 97.5 F (36.4 C)  SpO2: 99%    General: Patient is in no apparent distress. Attention to examination is appropriate.  Neck:   Supple.  Full range of motion.  Respiratory: Patient is breathing without any difficulty.   NEUROLOGICAL:     Awake, alert, oriented to person, place, and time.  Speech is clear and fluent.   Cranial Nerves: Pupils equal round and reactive to light.  Facial tone is symmetric.  Facial sensation is symmetric. Shoulder shrug is symmetric. Tongue protrusion is midline.    Strength: His bilateral upper and lower extremities show at least 4+ to 5 strength throughout.  The most notable deficit is in the left hip flexor which appears to be approximately 4 out of 5.  Reflexes are 3+ at the bilateral Achilles and patellas.  He has positive clonus bilaterally.  In the upper extremities he does have spreading of the brachial radialis reflex.  His biceps reflexes 2+, triceps reflexes 2+, left-sided Hoffmann's is present.   He does discuss some decrease sensation in the bilateral lower extremities that has been ascending.     No evidence of dysmetria noted.  Gait is abnormal, uses a cane for ambulation given lack of balance.  Imaging:  I have personally reviewed the images, he continues to have severe stenosis at the C3-4 junction.  He also has multilevel spondylosis but appears to have an autofusion C4-6.  But from C6-T1 there continues to be significant stenosis with effacement of the CSF space and compression of the spinal cord at these levels.  Medical Decision Making/Assessment and Plan: Mr. Bingaman is a pleasant 77 y.o. male with chronic neck issues, however worsening myelopathy that he has noticed a sharp decline in the past 4 months.  He has noticed difficulty with dropping things.  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  He is also having decreased ability to ambulate.  He often feels  off balance and that his walking is getting worse.  He does have a cane for ambulation.  On physical exam he demonstrates diffuse hyperreflexia with crossed adductor signs, clonus bilaterally, 3+ with spreading reflexes in the bilateral lower extremities, 3+ at the brachioradialis but 2+ at the biceps and triceps.  He does have a positive Hoffmann sign on the left.  His imaging  demonstrates significant stenosis at the C3-4 level as well as the C6-T1 level.  He has some mild spondylosis at the levels intervening.  Does appear to have a autofusion at those levels.  He has an exaggerated upper thoracic kyphosis with a slight loss of cervical lordosis.  Will plan on getting x-rays, will review for any mobile spondylolisthesis.  Given his health and autofusion throughout, we will go forward with Cervical laminoplasty, C3-T1.   Thank you for involving me in the care of this patient.   Lovenia Kim MD/MSCR Neurosurgery

## 2023-04-21 NOTE — Discharge Instructions (Signed)
Your surgeon has performed an operation on your cervical spine (neck) to relieve pressure on the spinal cord and/or nerves.  The following are instructions to help in your recovery once you have been discharged from the hospital. Even if you feel well, it is important that you follow these activity guidelines. If you do not let your neck heal properly from the surgery, you can increase the chance of return of your symptoms and other complications.   Activity    No bending, lifting, or twisting ("BLT"). Avoid lifting objects heavier than 10 pounds (gallon milk jug).  Where possible, avoid household activities that involve lifting, bending, reaching, pushing, or pulling such as laundry, vacuuming, grocery shopping, and childcare. Try to arrange for help from friends and family for these activities while your back heals.  Increase physical activity slowly as tolerated.  Taking short walks is encouraged, but avoid strenuous exercise. Do not jog, run, bicycle, lift weights, or participate in any other exercises unless specifically allowed by your doctor.  Talk to your doctor before resuming sexual activity.  You should not drive until cleared by your doctor.  Until released by your doctor, you should not return to work or school.  You should rest at home and let your body heal.   You may shower three days after your surgery.  After showering, lightly dab your incision dry. Do not take a tub bath or go swimming until approved by your doctor at your follow-up appointment.  If your doctor ordered a cervical collar (neck brace) for you, you should wear it whenever you are out of bed. You may remove it when lying down or sleeping, but you should wear it at all other times. Not all neck surgeries require a cervical collar.  If you smoke, we strongly recommend that you quit.  Smoking has been proven to interfere with normal bone healing and will dramatically reduce the success rate of your surgery. Please  contact [facility] (800-QUIT-NOW) and use the resources at www.[facility].com for assistance in stopping smoking.  Surgical Incision   If you have a dressing on your incision, you may remove it two days after your surgery. Keep your incision area clean and dry.  If you have staples or stitches on your incision, you should have a follow up scheduled for removal. If you do not have staples or stitches, you will have steri-strips (small pieces of surgical tape) or Dermabond glue. The steri-strips/glue should begin to peel away within about a week (it is fine if the steri-strips fall off before then). If the strips are still in place one week after your surgery, you may gently remove them.  Diet           You may return to your usual diet. However, you may experience discomfort when swallowing in the first month after your surgery. This is normal. You may find that softer foods are more comfortable for you to swallow. Be sure to stay hydrated.  When to Contact us  You may experience pain in your neck and/or pain between your shoulder blades. This is normal and should improve in the next few weeks with the help of pain medication, muscle relaxers, and rest. Some patients report that a warm compress on the back of the neck or between the shoulder blades helps.  However, should you experience any of the following, contact us immediately: New numbness or weakness Pain that is progressively getting worse, and is not relieved by your pain medication, muscle relaxers, rest,  and warm compresses Bleeding, redness, swelling, pain, or drainage from surgical incision Chills or flu-like symptoms Fever greater than 101.0 F (38.3 C) Inability to eat, drink fluids, or take medications Problems with bowel or bladder functions Difficulty breathing or shortness of breath Warmth, tenderness, or swelling in your calf Contact Information How to contact us:  If you have any questions/concerns before or after  surgery, you can reach [place] at [phone], or you can send a mychart message. We can be reached by phone or mychart 8am-4pm, Monday-Friday.  *Please note: Calls after 4pm are forwarded to a third party answering service. Mychart messages are not routinely monitored during evenings, weekends, and holidays. Please call our office to contact the answering service for urgent concerns during non-business hours.

## 2023-04-22 LAB — BASIC METABOLIC PANEL
Anion gap: 5 (ref 5–15)
BUN: 29 mg/dL — ABNORMAL HIGH (ref 8–23)
CO2: 22 mmol/L (ref 22–32)
Calcium: 7.6 mg/dL — ABNORMAL LOW (ref 8.9–10.3)
Chloride: 111 mmol/L (ref 98–111)
Creatinine, Ser: 1.36 mg/dL — ABNORMAL HIGH (ref 0.61–1.24)
GFR, Estimated: 54 mL/min — ABNORMAL LOW (ref >60)
Glucose, Bld: 154 mg/dL — ABNORMAL HIGH (ref 70–99)
Potassium: 4.6 mmol/L (ref 3.5–5.1)
Sodium: 138 mmol/L (ref 135–145)

## 2023-04-22 LAB — CBC
HCT: 24.7 % — ABNORMAL LOW (ref 39.0–52.0)
Hemoglobin: 7.8 g/dL — ABNORMAL LOW (ref 13.0–17.0)
MCH: 32.6 pg (ref 26.0–34.0)
MCHC: 31.6 g/dL (ref 30.0–36.0)
MCV: 103.3 fL — ABNORMAL HIGH (ref 80.0–100.0)
Platelets: 126 10*3/uL — ABNORMAL LOW (ref 150–400)
RBC: 2.39 MIL/uL — ABNORMAL LOW (ref 4.22–5.81)
RDW: 15.7 % — ABNORMAL HIGH (ref 11.5–15.5)
WBC: 9.9 10*3/uL (ref 4.0–10.5)
nRBC: 0 % (ref 0.0–0.2)

## 2023-04-22 MED ORDER — METHOCARBAMOL 500 MG PO TABS
ORAL_TABLET | ORAL | Status: AC
  Filled 2023-04-22: qty 1

## 2023-04-22 MED ORDER — KETOROLAC TROMETHAMINE 15 MG/ML IJ SOLN
INTRAMUSCULAR | Status: AC
  Filled 2023-04-22: qty 1

## 2023-04-22 MED ORDER — ACETAMINOPHEN 500 MG PO TABS
ORAL_TABLET | ORAL | Status: AC
  Filled 2023-04-22: qty 2

## 2023-04-22 MED ORDER — OXYCODONE HCL 5 MG PO TABS
ORAL_TABLET | ORAL | Status: AC
  Filled 2023-04-22: qty 1

## 2023-04-22 MED ORDER — ORAL CARE MOUTH RINSE: 15.0000 mL | OROMUCOSAL | Status: DC | PRN

## 2023-04-22 MED ORDER — ENOXAPARIN SODIUM 40 MG/0.4ML IJ SOSY
PREFILLED_SYRINGE | INTRAMUSCULAR | Status: AC
  Filled 2023-04-22: qty 0.4

## 2023-04-22 MED ORDER — SENNA 8.6 MG PO TABS
ORAL_TABLET | ORAL | Status: AC
  Filled 2023-04-22: qty 1

## 2023-04-22 MED ORDER — DOCUSATE SODIUM 100 MG PO CAPS
ORAL_CAPSULE | ORAL | Status: AC
  Filled 2023-04-22: qty 1

## 2023-04-22 MED ORDER — LORATADINE 10 MG PO TABS
ORAL_TABLET | ORAL | Status: AC
  Filled 2023-04-22: qty 1

## 2023-04-22 MED ORDER — TAMSULOSIN HCL 0.4 MG PO CAPS
ORAL_CAPSULE | ORAL | Status: AC
  Filled 2023-04-22: qty 1

## 2023-04-22 MED ORDER — FERROUS SULFATE 325 (65 FE) MG PO TABS
ORAL_TABLET | ORAL | Status: AC
  Filled 2023-04-22: qty 1

## 2023-04-22 MED ORDER — PANTOPRAZOLE SODIUM 40 MG PO TBEC
DELAYED_RELEASE_TABLET | ORAL | Status: AC
  Filled 2023-04-22: qty 1

## 2023-04-22 MED ORDER — SODIUM CHLORIDE 0.9 % IV BOLUS
500.0000 mL | Freq: Once | INTRAVENOUS | Status: AC
  Administered 2023-04-22: 500 mL via INTRAVENOUS

## 2023-04-22 MED ORDER — EZETIMIBE 10 MG PO TABS
ORAL_TABLET | ORAL | Status: AC
  Filled 2023-04-22: qty 1

## 2023-04-22 NOTE — TOC Initial Note (Signed)
Transition of Care [facility]) - Initial/Assessment Note    Patient Details  Name: [name] MRN: [id number] Date of Birth: 1946/04/20  Transition of Care [facility]) CM/SW Contact:    Marlowe Sax, RN Phone Number: 04/22/2023, 3:03 PM  Clinical Narrative:                  Met with the patient and he stated that he does not need DME< he has done [facility] in the past and does not have a preference of which agency he uses [facility] accepted the patient for [facility] services His spouse to provide transportation  Expected Discharge Plan: Home w Home Health Services Barriers to Discharge: Continued Medical Work up   Patient Goals and CMS Choice Patient states their goals for this hospitalization and ongoing recovery are:: return home          Expected Discharge Plan and Services In-house Referral: NA Discharge Planning Services: CM Consult   Living arrangements for the past 2 months: Single Family Home                   DME Agency: NA       HH Arranged: PT, OT HH Agency: [facility] Date [facility] Agency Contacted: 04/22/23 Time HH Agency Contacted: 1502 Representative spoke with at [facility] Agency: Kandee Keen  Prior Living Arrangements/Services Living arrangements for the past 2 months: Single Family Home Lives with:: Spouse Patient language and need for interpreter reviewed:: Yes Do you feel safe going back to the place where you live?: Yes          Current home services: DME (Electric scooter;Cane - single point;BSC/3in1;Shower seat;Rolling Walker (2 wheels);Rollator (4 wheels)) Criminal Activity/Legal Involvement Pertinent to Current Situation/Hospitalization: No - Comment as needed  Activities of Daily Living Home Assistive Devices/Equipment: Cane (specify quad or straight), Dentures (specify type), Hearing aid ADL Screening (condition at time of admission) Independently performs ADLs?: Yes (appropriate for developmental age) Is the patient deaf or have difficulty hearing?: Yes  (wears bilateral hearing aids) Does the patient have difficulty seeing, even when wearing glasses/contacts?: No Does the patient have difficulty concentrating, remembering, or making decisions?: No  Permission Sought/Granted   Permission granted to share information with : Yes, Verbal Permission Granted              Emotional Assessment Appearance:: Appears stated age Attitude/Demeanor/Rapport: Gracious Affect (typically observed): Accepting Orientation: : Oriented to Self, Oriented to Place, Oriented to  Time, Oriented to Situation Alcohol / Substance Use: Not Applicable Psych Involvement: No (comment)  Admission diagnosis:  Cervical myelopathy (HCC) [G95.9] Patient Active Problem List   Diagnosis Date Noted   Spondylosis, cervical, with myelopathy 04/21/2023   Cervical myelopathy (HCC) 04/21/2023   Campylobacter enteritis 04/02/2023   Class 1 obesity due to excess calories with body mass index (BMI) of 30.0 to 30.9 in adult 04/01/2023   Hypotension 03/31/2023   Campylobacter gastroenteritis 03/31/2023   Respiratory tract infection due to COVID-19 virus 03/10/2023   Spinal stenosis 01/09/2023   Dizziness 01/09/2023   BPH (benign prostatic hyperplasia) 01/09/2023   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   Sensorineural hearing loss, 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   (HFpEF) heart failure with preserved ejection fraction (HCC) 06/30/2022   Abnormal INR 06/30/2022   Anxiety 06/30/2022   GERD (gastroesophageal reflux disease) 06/30/2022   History of CVA (cerebrovascular accident) 06/30/2022   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/30/2022   Anemia 06/02/2022   Dysphagia 01/03/2022   Lymphadenopathy 01/03/2022  Chronic neck pain 01/03/2022   Hearing loss 12/23/2021   Personal history of tobacco use, presenting hazards to health 12/23/2021   Polyp of colon 12/23/2021   Neck pain 12/23/2021   Neck nodule 11/11/2021   CVA (cerebral vascular accident)  (HCC) 10/18/2021   AKI (acute kidney injury) (HCC) 09/13/2021   Weakness 09/04/2021   COVID-19 virus infection 09/04/2021   Accelerating angina (HCC) 02/04/2021   Abnormal stress test    Cardiomyopathy [facility])    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   CAD (coronary artery disease)    Depression    Arthritis 09/16/2019   Dehydration 03/14/2019   Sick sinus syndrome (HCC) 07/08/2016   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   Thrombocytopenia (HCC) 02/29/2016   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    PAD (peripheral artery disease) (HCC)    OSA on CPAP    Chronic diastolic CHF (congestive heart failure) (HCC) 09/20/2015   CLL (chronic lymphocytic leukemia) (HCC)    PTSD (post-traumatic stress disorder)    Osteoarthritis of both knees 07/05/2015   COPD (chronic obstructive pulmonary disease) (HCC) 01/01/2012   Paroxysmal atrial fibrillation (HCC) 10/09/2011   Hyperlipidemia 11/28/2009   Essential hypertension 11/28/2009   PCP:  Glori Luis, MD Pharmacy:   [facility] [phone] [place], [place] - [address] AT [facility] [address] [facility] [address] [place] [address] Phone: [phone] Fax: [phone]  [facility] [address] [place] [address] Phone: [phone] Fax: [phone]  [facility] [place], [place] - [address] [place] [address] Phone: [phone] Fax: [phone]     Social Determinants of Health (SDOH) Social History: SDOH Screenings   Food Insecurity: No Food Insecurity (04/21/2023)  Housing: Low Risk  (04/21/2023)  Transportation Needs: No Transportation Needs (04/21/2023)  Utilities: Not At Risk (04/21/2023)  Depression (PHQ2-9): Medium Risk (04/10/2023)  Financial Resource Strain: Low Risk  (09/12/2022)  Physical Activity: Unknown (09/12/2022)  Social Connections: Unknown (09/12/2022)  Stress: No Stress Concern  Present (09/12/2022)  Tobacco Use: Medium Risk (04/21/2023)   SDOH Interventions:     Readmission Risk Interventions    10/19/2021   10:18 AM  Readmission Risk Prevention Plan  Transportation Screening Complete  PCP or Specialist Appt within 3-5 Days Complete  HRI or Home Care Consult Complete  Social Work Consult for Recovery Care Planning/Counseling Complete  Palliative Care Screening Not Applicable  Medication Review [employer]) Complete

## 2023-04-22 NOTE — Progress Notes (Signed)
Neurosurgery Progress Note  History: [name] is s/p C3-T1 Laminoplasty w/ Dr. Katrinka Blazing on 04/21/23.   POD1: Pt reports expected posterior neck pain that is mostly controlled with current medications.   Physical Exam: Vitals:   04/21/23 2200 04/22/23 0353  BP: 114/65 124/67  Pulse: 65 62  Resp: 16 16  Temp: 97.7 F (36.5 C) 98.2 F (36.8 C)  SpO2: 96% 99%    AA Ox3  CNI HV output 100 mL since surgery  Strength: 4+/5 throughout Incision covered with post-op dressing Assessment/Plan:  [name] is s/p C3-T1 Laminoplasty w/ Dr. Katrinka Blazing on 04/21/23. Will be assessed by PT/OT today for ambulation.   - mobilize - pain control - DVT prophylaxis - will continue to monitor drain output - Pt experiencing acute blood loss anemia. Will continue to monitor H&H - mild AKI, continue IV fluids and encourage PO fluids.  Manning Charity PA-C and Rosezena Sensor PA-S Department of Neurosurgery

## 2023-04-22 NOTE — Plan of Care (Signed)
  Problem: Activity: Goal: Will remain free from falls Outcome: Progressing

## 2023-04-22 NOTE — Evaluation (Signed)
Physical Therapy Evaluation Patient Details Name: [name] MRN: [id number] DOB: [birth year] Today's Date: 04/22/2023  History of Present Illness  Pt is a 77 y.o. male POD #1 posterior cervical laminoplasty C3-T1  Clinical Impression  Pt pleasant and eager to work with PT.  He reports significant expected pain t/o neck, but participated well and generally was able to do bed mobility w/o physical assist but was heavily reliant on the walker/UEs during standing activity.  Pt reports that he is not normally able to do any prolonged ambulation, but with RW felt good today with ambulation near his baseline though he did display some low grade buckling/lack of TKE/LE weakness posturing during ambulation.  Pt will benefit from PT to address functional limitations and insure safe discharge planning.     If plan is discharge home, recommend the following: A little help with walking and/or transfers;A little help with bathing/dressing/bathroom   Can travel by private vehicle        Equipment Recommendations None recommended by PT  Recommendations for Other Services       Functional Status Assessment Patient has had a recent decline in their functional status and demonstrates the ability to make significant improvements in function in a reasonable and predictable amount of time.     Precautions / Restrictions Precautions Precautions: Back Precaution Comments: Handout provided Required Braces or Orthoses:  (no brace needed) Restrictions Weight Bearing Restrictions: No      Mobility  Bed Mobility Overal bed mobility: Modified Independent             General bed mobility comments: Pt was able to get himself up to sitting w/o phyiscal assist, extra cuing, time and need for UEs on bed rails but overall with expected guarding did well    Transfers Overall transfer level: Needs assistance Equipment used: Rolling walker (2 wheels) Transfers: Sit to/from Stand Sit to Stand: Contact  guard assist                Ambulation/Gait Ambulation/Gait assistance: Modified independent (Device/Increase time) Gait Distance (Feet): 75 Feet Assistive device: Rolling walker (2 wheels)         General Gait Details: Pt was able to ambulate with slow but safe gait, reliant on walker w/o LOBs.  B/L LE show low grade buckling and coordination issues.  Stairs            Wheelchair Mobility     Tilt Bed    Modified Rankin (Stroke Patients Only)       Balance Overall balance assessment: Needs assistance   Sitting balance-Leahy Scale: Good Sitting balance - Comments: no issues     Standing balance-Leahy Scale: Fair Standing balance comment: Pt with no LOBs but reliant on UEs/AD during ambulation and showed decreased (reports as near baseline) quality of motion/foot placement issues                             Pertinent Vitals/Pain Pain Assessment Pain Assessment: 0-10 Pain Score: 5  Pain Descriptors / Indicators: Aching, Operative site guarding, Grimacing    Home Living Family/patient expects to be discharged to:: Private residence Living Arrangements: Spouse/significant other Available Help at Discharge: Family (wife and granddaughter in home, daughther lives next door) Type of Home: House Home Access: Ramped entrance       Home Layout: One level Home Equipment: Electric scooter;Cane - single point;BSC/3in1;Shower seat;Rolling Environmental consultant (2 wheels);Rollator (4 wheels)      Prior  Function Prior Level of Function : Independent/Modified Independent;History of Falls (last six months)             Mobility Comments: Uses electric scooter for mobility inside and outside of home ADLs Comments: Per Pt report MOD I with ADLs, not driving     Extremity/Trunk Assessment   Upper Extremity Assessment Upper Extremity Assessment: Overall WFL for tasks assessed    Lower Extremity Assessment Lower Extremity Assessment: Generalized weakness  (strength functional and equal b/l, but displays mild LE weakness/low grade buckling/foot placement issues that he manage well with walker while standing)       Communication   Communication Communication: No apparent difficulties Cueing Techniques: Verbal cues;Tactile cues;Visual cues  Cognition Arousal: Alert Behavior During Therapy: WFL for tasks assessed/performed Overall Cognitive Status: No family/caregiver present to determine baseline cognitive functioning                                          General Comments General comments (skin integrity, edema, etc.): pleasant and motivated    Exercises     Assessment/Plan    PT Assessment Patient needs continued PT services  PT Problem List Decreased activity tolerance;Decreased strength;Decreased balance;Decreased mobility;Decreased safety awareness;Pain;Cardiopulmonary status limiting activity       PT Treatment Interventions DME instruction;Gait training    PT Goals (Current goals can be found in the Care Plan section)  Acute Rehab PT Goals Patient Stated Goal: Pt eager to get back home when ready PT Goal Formulation: With patient Time For Goal Achievement: 05/05/23 Potential to Achieve Goals: Good    Frequency 7X/week     Co-evaluation               AM-PAC PT "6 Clicks" Mobility  Outcome Measure Help needed turning from your back to your side while in a flat bed without using bedrails?: None Help needed moving from lying on your back to sitting on the side of a flat bed without using bedrails?: None Help needed moving to and from a bed to a chair (including a wheelchair)?: None Help needed standing up from a chair using your arms (e.g., wheelchair or bedside chair)?: A Little Help needed to walk in hospital room?: A Little Help needed climbing 3-5 steps with a railing? : A Little 6 Click Score: 21    End of Session Equipment Utilized During Treatment: Gait belt Activity Tolerance:  Patient tolerated treatment well;Patient limited by pain Patient left: in chair;with call bell/phone within reach Nurse Communication: Mobility status PT Visit Diagnosis: Muscle weakness (generalized) (M62.81);Difficulty in walking, not elsewhere classified (R26.2);Pain Pain - part of body:  (cervicalgia)    Time: 6578-4696 PT Time Calculation (min) (ACUTE ONLY): 31 min   Charges:   PT Evaluation $PT Eval Low Complexity: 1 Low PT Treatments $Gait Training: 8-22 mins PT General Charges $$ ACUTE PT VISIT: 1 Visit         Malachi Pro, DPT 04/22/2023, 1:01 PM

## 2023-04-22 NOTE — Progress Notes (Signed)
Pts BP 93/51, asymptomatic. PA,Danielle Alycia Rossetti notified. Orders received for NS 500 ml bolus. Order placed.

## 2023-04-22 NOTE — Plan of Care (Signed)
  Problem: Activity: Goal: Ability to avoid complications of mobility impairment will improve Outcome: Progressing   Problem: Pain Management: Goal: Pain level will decrease Outcome: Progressing   

## 2023-04-22 NOTE — Evaluation (Signed)
Occupational Therapy Evaluation Patient Details Name: [name] MRN: [id number] DOB: 08/24/1945 Today's Date: 04/22/2023   History of Present Illness Pt is a 77 year old male s/p  Posterior Cervical Laminoplasty C3-T1 non 04/21/23; PMH significant for  CAD (s/p CABG), MI, atrial fibrillation, ischemic cardiomyopathy, HFimpEF, sinus pause/SSS/symptomatic bradycardia (s/p PPM placement), ischemic CVA, TIA, HTN, HLD, COPD, agent orange exposure, OSAH (requires nocturnal PAP therapy), GERD (on daily PPI), CLL, chronic malignancy related pain, OA, cervical spondylosis with myelopathy, BPH, anxiety, PTSD   Clinical Impression   Pt seen for OT evaluation this date, POD#1 from posterior cervical laminoplasty C3-T1. Prior to hospital admission, pt was MOD I with ADL, assist from family for IADLas needed. Pt reports use of electric scooter as needed in and out of house. During eval Pt alert and orientated and egar to participate. Pt lives with spouse and granddaughter in family home with a ramped entrance to accommodate his electric scooter. Pt states his wife and granddaughter are able to provide assistance as needed as well as his daughter living next door for additional help. Currently pt is CGA with all aspects of mobility and transfers. At times needs additional time for initial lift off and sitting due to post surgery pain. Pt completed STS t/f > hallway bathroom ~ amb 30 ft + CGA. Pt educated in cervical precautions, home/routines modifications to maximize safety and functional independence while minimizing falls risk and maintaining precautions. Pt verbalized understanding of all education/training provided. Able to return demonstration safe techniques while maintaining cervical precautions throughout. Handout provided to support recall and carry over of learned precautions/techniques for bed mobility, functional transfers, and self care skills.  Pt would benefit from continued skilled OT services to for  safe ADL completion. OT will follow acutely.       If plan is discharge home, recommend the following: Assist for transportation;A little help with bathing/dressing/bathroom;A little help with walking and/or transfers    Functional Status Assessment  Patient has had a recent decline in their functional status and demonstrates the ability to make significant improvements in function in a reasonable and predictable amount of time.  Equipment Recommendations  None recommended by OT    Recommendations for Other Services       Precautions / Restrictions Precautions Precautions: Back Precaution Comments: Handout provided Required Braces or Orthoses:  (no brace needed) Restrictions Weight Bearing Restrictions: No      Mobility Bed Mobility               General bed mobility comments: Not assessed on this date, Pt in recliner pre/post session    Transfers Overall transfer level: Needs assistance Equipment used: Rolling walker (2 wheels) Transfers: Sit to/from Stand Sit to Stand: Contact guard assist (verbal cues to adhere to back precautions)                  Balance Overall balance assessment: Mild deficits observed, not formally tested                                         ADL either performed or assessed with clinical judgement   ADL Overall ADL's : Needs assistance/impaired Eating/Feeding: Supervision/ safety;Sitting Eating/Feeding Details (indicate cue type and reason): Anticipate no concerns Grooming: Wash/dry face;Sitting;Supervision/safety Grooming Details (indicate cue type and reason): Anticipate no concerns, Pt demonstrated good fine motor abilites during eval  Toilet Transfer: Contact guard assist;BSC/3in1;Ambulation;Rolling walker (2 wheels);Grab bars           Functional mobility during ADLs: Contact guard assist;Rolling walker (2 wheels);Cueing for safety General ADL Comments: Verbal cues to remind Pt  to adhere to back precautions during amb and t/f     Vision Baseline Vision/History: 0 No visual deficits       Perception         Praxis         Pertinent Vitals/Pain Pain Assessment Pain Assessment: Faces Faces Pain Scale: Hurts a little bit Pain Location: Postier neck Pain Descriptors / Indicators: Aching, Operative site guarding, Grimacing Pain Intervention(s): Monitored during session, Premedicated before session, Repositioned (RN in room pre session to provide meds)     Extremity/Trunk Assessment Upper Extremity Assessment Upper Extremity Assessment: Overall WFL for tasks assessed   Lower Extremity Assessment Lower Extremity Assessment: Generalized weakness (strength functional and equal b/l, but displays mild LE weakness/low grade buckling/foot placement issues that he manage well with walker while standing)       Communication Communication Communication: No apparent difficulties Cueing Techniques: Verbal cues;Tactile cues;Visual cues   Cognition Arousal: Alert Behavior During Therapy: WFL for tasks assessed/performed Overall Cognitive Status: No family/caregiver present to determine baseline cognitive functioning                                 General Comments: Pt AO during session     General Comments  pleasant and motivated    Exercises Other Exercises Other Exercises: Edu: Home safety, precautions adherance during ADLs   Shoulder Instructions      Home Living Family/patient expects to be discharged to:: Private residence Living Arrangements: Spouse/significant other Available Help at Discharge: Family (wife and granddaughter in home, daughther lives next door) Type of Home: House Home Access: Ramped entrance     Home Layout: One level     Bathroom Shower/Tub: Producer, television/film/video:  (places BSC over toilet to use as needed)     Home Equipment: Electric scooter;Cane - single point;BSC/3in1;Shower seat;Rolling  Environmental consultant (2 wheels);Rollator (4 wheels)          Prior Functioning/Environment Prior Level of Function : Independent/Modified Independent;History of Falls (last six months)             Mobility Comments: Uses electric scooter for mobility inside and outside of home ADLs Comments: Per Pt report MOD I with ADLs, not driving        OT Problem List: Decreased strength;Decreased activity tolerance;Decreased safety awareness;Impaired balance (sitting and/or standing);Decreased knowledge of precautions;Decreased knowledge of use of DME or AE      OT Treatment/Interventions: Self-care/ADL training;Therapeutic exercise;DME and/or AE instruction;Therapeutic activities;Patient/family education    OT Goals(Current goals can be found in the care plan section) Acute Rehab OT Goals Patient Stated Goal: To return home with less pain OT Goal Formulation: With patient Time For Goal Achievement: 05/06/23 Potential to Achieve Goals: Good ADL Goals Pt Will Perform Grooming: with modified independence;standing Pt Will Perform Lower Body Dressing: with modified independence;sit to/from stand Pt Will Transfer to Toilet: with modified independence;ambulating;bedside commode Pt Will Perform Toileting - Clothing Manipulation and hygiene: with modified independence;sit to/from stand  OT Frequency: Min 1X/week    Co-evaluation              AM-PAC OT "6 Clicks" Daily Activity     Outcome Measure Help from another person eating  meals?: None Help from another person taking care of personal grooming?: A Little Help from another person toileting, which includes using toliet, bedpan, or urinal?: A Little Help from another person bathing (including washing, rinsing, drying)?: A Little Help from another person to put on and taking off regular upper body clothing?: A Little Help from another person to put on and taking off regular lower body clothing?: A Lot 6 Click Score: 18   End of Session Equipment  Utilized During Treatment: Gait belt;Rolling walker (2 wheels) Nurse Communication: Mobility status  Activity Tolerance: Patient tolerated treatment well Patient left: in chair;with call bell/phone within reach  OT Visit Diagnosis: Unsteadiness on feet (R26.81);Other abnormalities of gait and mobility (R26.89);Muscle weakness (generalized) (M62.81)                Time: 4332-9518 OT Time Calculation (min): 29 min Charges:  OT General Charges $OT Visit: 1 Visit OT Evaluation $OT Eval Moderate Complexity: 1 Mod  [employer], OTS

## 2023-04-23 ENCOUNTER — Encounter: Payer: Self-pay | Admitting: Neurosurgery

## 2023-04-23 LAB — BASIC METABOLIC PANEL
Anion gap: 7 (ref 5–15)
BUN: 28 mg/dL — ABNORMAL HIGH (ref 8–23)
CO2: 21 mmol/L — ABNORMAL LOW (ref 22–32)
Calcium: 7.4 mg/dL — ABNORMAL LOW (ref 8.9–10.3)
Chloride: 108 mmol/L (ref 98–111)
Creatinine, Ser: 1.1 mg/dL (ref 0.61–1.24)
GFR, Estimated: >60 mL/min (ref >60)
Glucose, Bld: 108 mg/dL — ABNORMAL HIGH (ref 70–99)
Potassium: 4 mmol/L (ref 3.5–5.1)
Sodium: 136 mmol/L (ref 135–145)

## 2023-04-23 LAB — CBC
HCT: 20.7 % — ABNORMAL LOW (ref 39.0–52.0)
Hemoglobin: 6.7 g/dL — ABNORMAL LOW (ref 13.0–17.0)
MCH: 32.7 pg (ref 26.0–34.0)
MCHC: 32.4 g/dL (ref 30.0–36.0)
MCV: 101 fL — ABNORMAL HIGH (ref 80.0–100.0)
Platelets: 98 10*3/uL — ABNORMAL LOW (ref 150–400)
RBC: 2.05 MIL/uL — ABNORMAL LOW (ref 4.22–5.81)
RDW: 16 % — ABNORMAL HIGH (ref 11.5–15.5)
WBC: 5.5 10*3/uL (ref 4.0–10.5)
nRBC: 0 % (ref 0.0–0.2)

## 2023-04-23 LAB — HEMOGLOBIN AND HEMATOCRIT, BLOOD
HCT: 23.6 % — ABNORMAL LOW (ref 39.0–52.0)
Hemoglobin: 7.8 g/dL — ABNORMAL LOW (ref 13.0–17.0)

## 2023-04-23 LAB — PREPARE RBC (CROSSMATCH)

## 2023-04-23 MED ORDER — AMIODARONE HCL 200 MG PO TABS
ORAL_TABLET | ORAL | Status: AC
  Filled 2023-04-23: qty 1

## 2023-04-23 MED ORDER — KETOROLAC TROMETHAMINE 15 MG/ML IJ SOLN
INTRAMUSCULAR | Status: AC
  Filled 2023-04-23: qty 1

## 2023-04-23 MED ORDER — ENOXAPARIN SODIUM 40 MG/0.4ML IJ SOSY
PREFILLED_SYRINGE | INTRAMUSCULAR | Status: AC
  Filled 2023-04-23: qty 0.4

## 2023-04-23 MED ORDER — EZETIMIBE 10 MG PO TABS
ORAL_TABLET | ORAL | Status: AC
  Filled 2023-04-23: qty 1

## 2023-04-23 MED ORDER — METHOCARBAMOL 500 MG PO TABS
ORAL_TABLET | ORAL | Status: AC
  Filled 2023-04-23: qty 1

## 2023-04-23 MED ORDER — OXYCODONE HCL 5 MG PO TABS
ORAL_TABLET | ORAL | Status: AC
  Filled 2023-04-23: qty 1

## 2023-04-23 MED ORDER — SODIUM CHLORIDE 0.9% IV SOLUTION: Freq: Once | INTRAVENOUS | Status: DC

## 2023-04-23 MED ORDER — KETOROLAC TROMETHAMINE 15 MG/ML IJ SOLN
15.0000 mg | Freq: Three times a day (TID) | INTRAMUSCULAR | Status: AC
  Administered 2023-04-23 (×2): 15 mg via INTRAVENOUS

## 2023-04-23 MED ORDER — TAMSULOSIN HCL 0.4 MG PO CAPS
ORAL_CAPSULE | ORAL | Status: AC
  Filled 2023-04-23: qty 1

## 2023-04-23 MED ORDER — ACETAMINOPHEN 500 MG PO TABS
ORAL_TABLET | ORAL | Status: AC
  Filled 2023-04-23: qty 2

## 2023-04-23 MED ORDER — DOCUSATE SODIUM 100 MG PO CAPS
ORAL_CAPSULE | ORAL | Status: AC
  Filled 2023-04-23: qty 1

## 2023-04-23 MED ORDER — SENNA 8.6 MG PO TABS
ORAL_TABLET | ORAL | Status: AC
  Filled 2023-04-23: qty 1

## 2023-04-23 MED ORDER — FERROUS SULFATE 325 (65 FE) MG PO TABS
ORAL_TABLET | ORAL | Status: AC
  Filled 2023-04-23: qty 1

## 2023-04-23 MED ORDER — PANTOPRAZOLE SODIUM 40 MG PO TBEC
DELAYED_RELEASE_TABLET | ORAL | Status: AC
  Filled 2023-04-23: qty 1

## 2023-04-23 NOTE — Plan of Care (Signed)
  Problem: Activity: Goal: Ability to avoid complications of mobility impairment will improve Outcome: Progressing   Problem: Pain Management: Goal: Pain level will decrease Outcome: Progressing   

## 2023-04-23 NOTE — Progress Notes (Addendum)
Physical Therapy Treatment Patient Details Name: [name] MRN: [id number] DOB: January 10, 1946 Today's Date: 04/23/2023   History of Present Illness Pt is a 77 year old male s/p  Posterior Cervical Laminoplasty C3-T1 non 04/21/23; PMH significant for  CAD (s/p CABG), MI, atrial fibrillation, ischemic cardiomyopathy, HFimpEF, sinus pause/SSS/symptomatic bradycardia (s/p PPM placement), ischemic CVA, TIA, HTN, HLD, COPD, agent orange exposure, OSAH (requires nocturnal PAP therapy), GERD (on daily PPI), CLL, chronic malignancy related pain, OA, cervical spondylosis with myelopathy, BPH, anxiety, PTSD    PT Comments  Pt was pleasant and motivated to participate during the session and put forth good effort throughout. Pt is min A for bed mobility today for LE management during log roll technique. CGA for transfers today, needing a few trials before being able to stand but never needing physical assist. Pt is Mod I for amb, able to perform ~120 feet with RW. Pt performing gait with bilateral LE's slightly externally rotated, consistently walking in crouched position, not getting TKE. Otherwise he is taking steady steps, no imbalance noted. Pt performed a few resisted/non-resisted LE exercises seated in chair at end of session. Pt will benefit from continued PT services upon discharge to safely address deficits listed in patient problem list for decreased caregiver assistance and eventual return to PLOF.    If plan is discharge home, recommend the following: A little help with walking and/or transfers;A little help with bathing/dressing/bathroom   Can travel by private vehicle        Equipment Recommendations  None recommended by PT    Recommendations for Other Services       Precautions / Restrictions Precautions Precautions: Back Restrictions Weight Bearing Restrictions: No     Mobility  Bed Mobility Overal bed mobility: Needs Assistance Bed Mobility: Supine to Sit     Supine to sit:  HOB elevated, Min assist     General bed mobility comments: Needing Min A for LE management and cues for log roll sequencing    Transfers Overall transfer level: Needs assistance Equipment used: Rolling walker (2 wheels) Transfers: Sit to/from Stand Sit to Stand: Contact guard assist           General transfer comment: Pt had to take a few attempts to complete but able to stand with cues for forwards weight shift.    Ambulation/Gait Ambulation/Gait assistance: Modified independent (Device/Increase time) Gait Distance (Feet): 120 Feet Assistive device: Rolling walker (2 wheels) Gait Pattern/deviations: Step-through pattern, Ataxic, Narrow base of support, Knee flexed in stance - left, Knee flexed in stance - right, Decreased step length - right, Decreased step length - left, Decreased stride length Gait velocity: decreased     General Gait Details: Pt amb with slow but steady steps. Overall reliant on RW. Pt experienced no LOB, no imbalance noted. Pt walking with Bilateral LE's alightly externally rotated, never achieving full KTE in slightly crouched position.   Stairs             Wheelchair Mobility     Tilt Bed    Modified Rankin (Stroke Patients Only)       Balance Overall balance assessment: Needs assistance   Sitting balance-Leahy Scale: Good       Standing balance-Leahy Scale: Fair Standing balance comment: Static standing with RW, now LOB during amb                            Cognition Arousal: Alert Behavior During Therapy: [facility] for  tasks assessed/performed Overall Cognitive Status: No family/caregiver present to determine baseline cognitive functioning                                          Exercises Total Joint Exercises Straight Leg Raises: 5 reps, AROM, Strengthening, Both Long Arc Quad: AROM, 10 reps, Both, Strengthening (resisted) Other Exercises Other Exercises: education on precautions during  session    General Comments        Pertinent Vitals/Pain Pain Assessment Pain Assessment: 0-10 Pain Score: 10-Worst pain ever (Pt in greater pain compared to prior session) Pain Location: Postier neck Pain Descriptors / Indicators: Aching, Grimacing Pain Intervention(s): Monitored during session, Limited activity within patient's tolerance    Home Living                          Prior Function            PT Goals (current goals can now be found in the care plan section) Progress towards PT goals: Progressing toward goals    Frequency    7X/week      PT Plan      Co-evaluation              AM-PAC PT "6 Clicks" Mobility   Outcome Measure  Help needed turning from your back to your side while in a flat bed without using bedrails?: A Little Help needed moving from lying on your back to sitting on the side of a flat bed without using bedrails?: A Little Help needed moving to and from a bed to a chair (including a wheelchair)?: None Help needed standing up from a chair using your arms (e.g., wheelchair or bedside chair)?: None Help needed to walk in hospital room?: None Help needed climbing 3-5 steps with a railing? : A Little 6 Click Score: 21    End of Session Equipment Utilized During Treatment: Gait belt Activity Tolerance: Patient tolerated treatment well;Patient limited by pain Patient left: in chair;with call bell/phone within reach Nurse Communication: Mobility status PT Visit Diagnosis: Muscle weakness (generalized) (M62.81);Difficulty in walking, not elsewhere classified (R26.2);Pain Pain - part of body:  (cervicalgia)     Time: 5638-7564 PT Time Calculation (min) (ACUTE ONLY): 35 min  Charges:    $Gait Training: 8-22 mins $Therapeutic Exercise: 8-22 mins PT General Charges $$ ACUTE PT VISIT: 1 Visit                     [name], SPT 04/23/23, 10:41 AM This entire session was performed under direct supervision and direction  of a licensed therapist/therapist assistant. I have personally read, edited and approve of the note as written.  Loran Senters, DPT

## 2023-04-23 NOTE — Progress Notes (Addendum)
Occupational Therapy Treatment Patient Details Name: [name] MRN: [id number] DOB: [birth year] Today's Date: 04/23/2023   History of present illness Pt is a 77 year old male s/p  Posterior Cervical Laminoplasty C3-T1 non 04/21/23; PMH significant for  CAD (s/p CABG), MI, atrial fibrillation, ischemic cardiomyopathy, HFimpEF, sinus pause/SSS/symptomatic bradycardia (s/p PPM placement), ischemic CVA, TIA, HTN, HLD, COPD, agent orange exposure, OSAH (requires nocturnal PAP therapy), GERD (on daily PPI), CLL, chronic malignancy related pain, OA, cervical spondylosis with myelopathy, BPH, anxiety, PTSD   OT comments  Chart reviewed, nurse cleared pt for participation in OT tx session targeting improving functional tolerance for ADL tasks. Pt is sleeping, drowsy upon arrival however awakens easily and participates appropriately throughout session. Pt continues to require vcs for adherence to precautions, performing functional transfers with CGA with RW. Pt is left as received, all needs met. OT will follow acutely.       If plan is discharge home, recommend the following:  Assist for transportation;A little help with bathing/dressing/bathroom;A little help with walking and/or transfers   Equipment Recommendations  None recommended by OT    Recommendations for Other Services      Precautions / Restrictions Precautions Precautions: Back Restrictions Weight Bearing Restrictions: No       Mobility Bed Mobility Overal bed mobility: Needs Assistance Bed Mobility: Supine to Sit     Supine to sit: HOB elevated, Min assist     General bed mobility comments: requires Min A for LE management and cues for log roll sequencing    Transfers Overall transfer level: Needs assistance Equipment used: Rolling walker (2 wheels) Transfers: Sit to/from Stand Sit to Stand: Contact guard assist                 Balance Overall balance assessment: Needs assistance Sitting-balance support:  Feet supported Sitting balance-Leahy Scale: Good     Standing balance support: Bilateral upper extremity supported, During functional activity, Reliant on assistive device for balance Standing balance-Leahy Scale: Fair                             ADL either performed or assessed with clinical judgement   ADL Overall ADL's : Needs assistance/impaired                         Toilet Transfer: Contact guard assist;Ambulation;Rolling walker (2 wheels) Toilet Transfer Details (indicate cue type and reason): simulated toilet t/f to recliner         Functional mobility during ADLs: Contact guard assist;Rolling walker (2 wheels);Cueing for safety General ADL Comments: Verbal cues to remind Pt to adhere to back percautions during amb and t/f    Extremity/Trunk Assessment Upper Extremity Assessment Upper Extremity Assessment: Overall WFL for tasks assessed   Lower Extremity Assessment Lower Extremity Assessment: Generalized weakness        Vision Baseline Vision/History: 0 No visual deficits     Perception     Praxis      Cognition Arousal: Alert Behavior During Therapy: WFL for tasks assessed/performed Overall Cognitive Status: No family/caregiver present to determine baseline cognitive functioning                                 General Comments: alseep at start, drowsy throughout however agreeable to OT tx        Exercises Other Exercises Other Exercises:  edu re: precautions, safe ADL completion with precautions    Shoulder Instructions       General Comments Pt with blood transfusion running, ok'ed by nurse, vss throughout; IV site intact pre/post session    Pertinent Vitals/ Pain       Pain Assessment Pain Assessment: 0-10 Pain Score: 9  Pain Location: neck Pain Descriptors / Indicators: Operative site guarding, Grimacing, Guarding Pain Intervention(s): Monitored during session, RN gave pain meds during session, Utilized  relaxation techniques, Repositioned  Home Living                                          Prior Functioning/Environment              Frequency  Min 1X/week        Progress Toward Goals  OT Goals(current goals can now be found in the care plan section)  Progress towards OT goals: Progressing toward goals     Plan      Co-evaluation                 AM-PAC OT "6 Clicks" Daily Activity     Outcome Measure   Help from another person eating meals?: None Help from another person taking care of personal grooming?: A Little Help from another person toileting, which includes using toliet, bedpan, or urinal?: A Little Help from another person bathing (including washing, rinsing, drying)?: A Little Help from another person to put on and taking off regular upper body clothing?: A Little Help from another person to put on and taking off regular lower body clothing?: A Lot 6 Click Score: 18    End of Session Equipment Utilized During Treatment: Rolling walker (2 wheels)  OT Visit Diagnosis: Unsteadiness on feet (R26.81);Other abnormalities of gait and mobility (R26.89);Muscle weakness (generalized) (M62.81)   Activity Tolerance Patient tolerated treatment well   Patient Left in bed;with call bell/phone within reach;with nursing/sitter in room   Nurse Communication Mobility status        Time: 0347-4259 OT Time Calculation (min): 18 min  Charges: OT General Charges $OT Visit: 1 Visit OT Treatments $Therapeutic Activity: 8-22 mins  [employer], OTS

## 2023-04-23 NOTE — Progress Notes (Signed)
Neurosurgery Progress Note   History: [name] is s/p C3-T1 Laminoplasty w/ Dr. Katrinka Blazing on 04/21/23.   POD2: Pt endorses continued 8-9/10 posterior neck and BUE pain exacerbated by cervical motion. Has ambulated to bathroom today with use of walker and assistance of nursing staff. Reports some feelings of some instability with ambulation.  POD1: Pt reports expected posterior neck pain that is mostly controlled with current medications.    Physical Exam:     Vitals:    04/21/23 2200 04/22/23 0353  BP: 114/65 124/67  Pulse: 65 62  Resp: 16 16  Temp: 97.7 F (36.5 C) 98.2 F (36.8 C)  SpO2: 96% 99%      AA Ox3  CNI HV output 95 over the last 24 hours Strength: Remains 4+/5 throughout Incision covered with post-op dressing; No signs of excess drainage  Data:     Latest Ref Rng & Units 04/23/2023    7:25 AM 04/22/2023    6:24 AM 04/10/2023   11:16 AM  CBC  WBC 4.0 - 10.5 K/uL 5.5  9.9  13.5   Hemoglobin 13.0 - 17.0 g/dL 6.7  7.8  40.9   Hematocrit 39.0 - 52.0 % 20.7  24.7  31.7   Platelets 150 - 400 K/uL 98  126  111.0     Assessment/Plan:   [name] is s/p C3-T1 Laminoplasty w/ Dr. Katrinka Blazing on 04/21/23. Will be reassessed by PT/OT today for ambulation.    - mobilize - pain control - DVT prophylaxis - will continue to monitor drain output - Monitor acute blood loss anemia. RBC, H and H continued downtrend.   Consider packed red blood cells.  - mild AKI, Cr improved this morning. Will continue to monitor   Manning Charity PA-C and Rosezena Sensor PA Neurosurgery

## 2023-04-23 NOTE — Plan of Care (Signed)
  Problem: Activity: Goal: Ability to tolerate increased activity will improve Outcome: Progressing   Problem: Bladder/Genitourinary: Goal: Urinary functional status for postoperative course will improve Outcome: Progressing

## 2023-04-24 ENCOUNTER — Telehealth: Payer: Self-pay

## 2023-04-24 ENCOUNTER — Other Ambulatory Visit: Payer: Self-pay | Admitting: *Deleted

## 2023-04-24 DIAGNOSIS — D649 Anemia, unspecified: Secondary | ICD-10-CM

## 2023-04-24 LAB — TYPE AND SCREEN
ABO/RH(D): O NEG
Antibody Screen: NEGATIVE
Unit division: 0

## 2023-04-24 LAB — CBC
HCT: 27.4 % — ABNORMAL LOW (ref 39.0–52.0)
Hemoglobin: 8.8 g/dL — ABNORMAL LOW (ref 13.0–17.0)
MCH: 31.9 pg (ref 26.0–34.0)
MCHC: 32.1 g/dL (ref 30.0–36.0)
MCV: 99.3 fL (ref 80.0–100.0)
Platelets: 113 10*3/uL — ABNORMAL LOW (ref 150–400)
RBC: 2.76 MIL/uL — ABNORMAL LOW (ref 4.22–5.81)
RDW: 18.3 % — ABNORMAL HIGH (ref 11.5–15.5)
WBC: 8 10*3/uL (ref 4.0–10.5)
nRBC: 0 % (ref 0.0–0.2)

## 2023-04-24 LAB — BPAM RBC
Blood Product Expiration Date: 202410282359
ISSUE DATE / TIME: 202410031223
Unit Type and Rh: 5100

## 2023-04-24 MED ORDER — DOCUSATE SODIUM 100 MG PO CAPS
ORAL_CAPSULE | ORAL | Status: AC
  Filled 2023-04-24: qty 1

## 2023-04-24 MED ORDER — METHOCARBAMOL 500 MG PO TABS
ORAL_TABLET | ORAL | Status: AC
  Filled 2023-04-24: qty 1

## 2023-04-24 MED ORDER — TIZANIDINE HCL 4 MG PO CAPS: 4.0000 mg | ORAL_CAPSULE | Freq: Three times a day (TID) | ORAL | 0 refills | Status: AC

## 2023-04-24 MED ORDER — POLYETHYLENE GLYCOL 3350 17 G PO PACK
17.0000 g | PACK | Freq: Every day | ORAL | Status: DC
  Administered 2023-04-24 – 2023-04-25 (×2): 17 g via ORAL
  Filled 2023-04-24 (×2): qty 1

## 2023-04-24 MED ORDER — OXYCODONE HCL 5 MG PO TABS
ORAL_TABLET | ORAL | Status: AC
  Filled 2023-04-24: qty 1

## 2023-04-24 MED ORDER — ENOXAPARIN SODIUM 40 MG/0.4ML IJ SOSY
PREFILLED_SYRINGE | INTRAMUSCULAR | Status: AC
  Filled 2023-04-24: qty 0.4

## 2023-04-24 MED ORDER — FERROUS SULFATE 325 (65 FE) MG PO TABS
ORAL_TABLET | ORAL | Status: AC
  Filled 2023-04-24: qty 1

## 2023-04-24 MED ORDER — LORATADINE 10 MG PO TABS
ORAL_TABLET | ORAL | Status: AC
  Filled 2023-04-24: qty 1

## 2023-04-24 MED ORDER — AMIODARONE HCL 200 MG PO TABS
ORAL_TABLET | ORAL | Status: AC
  Filled 2023-04-24: qty 1

## 2023-04-24 MED ORDER — PANTOPRAZOLE SODIUM 40 MG PO TBEC
DELAYED_RELEASE_TABLET | ORAL | Status: AC
  Filled 2023-04-24: qty 1

## 2023-04-24 MED ORDER — METHOCARBAMOL 500 MG PO TABS: 500.0000 mg | ORAL_TABLET | Freq: Three times a day (TID) | ORAL | 0 refills | Status: DC | PRN

## 2023-04-24 MED ORDER — SENNA 8.6 MG PO TABS: 1.0000 | ORAL_TABLET | Freq: Every day | ORAL | 0 refills | Status: DC | PRN

## 2023-04-24 MED ORDER — ACETAMINOPHEN 500 MG PO TABS
ORAL_TABLET | ORAL | Status: AC
  Filled 2023-04-24: qty 1

## 2023-04-24 MED ORDER — SENNA 8.6 MG PO TABS
ORAL_TABLET | ORAL | Status: AC
  Filled 2023-04-24: qty 1

## 2023-04-24 MED ORDER — OXYCODONE HCL 5 MG PO TABS: 5.0000 mg | ORAL_TABLET | ORAL | 0 refills | Status: AC | PRN

## 2023-04-24 NOTE — Progress Notes (Signed)
Physical Therapy Treatment Patient Details Name: [name] MRN: [id number] DOB: 08-09-1945 Today's Date: 04/24/2023   History of Present Illness Pt is a 77 year old male s/p  Posterior Cervical Laminoplasty C3-T1 non 04/21/23; PMH significant for  CAD (s/p CABG), MI, atrial fibrillation, ischemic cardiomyopathy, HFimpEF, sinus pause/SSS/symptomatic bradycardia (s/p PPM placement), ischemic CVA, TIA, HTN, HLD, COPD, agent orange exposure, OSAH (requires nocturnal PAP therapy), GERD (on daily PPI), CLL, chronic malignancy related pain, OA, cervical spondylosis with myelopathy, BPH, anxiety, PTSD    PT Comments  Pt was long sitting in bed upon arrival. He is HOH and did not want to put on hearing aides at first but agrees by the end of session. He is A and O x 3. Agreeable to OOB activity and endorses being eager to go home. Pt was able to exit bed, stand to RW, and ambulate household/community distances. He successfully ambulated to BR and had BM. No difficulty standing from standard toilet surface. I'ly performed hygiene care after BM. Safely demonstrated abilities to ascend/descend 4 stairs with supervision only. Slight SOB with stairs and with further gait distances. Overall progressing well. DC recs remain appropriate.    If plan is discharge home, recommend the following: A little help with walking and/or transfers;A little help with bathing/dressing/bathroom     Equipment Recommendations  None recommended by PT       Precautions / Restrictions Precautions Precautions: Back Required Braces or Orthoses:  (none) Restrictions Weight Bearing Restrictions: No     Mobility  Bed Mobility Overal bed mobility: Needs Assistance Bed Mobility: Supine to Sit  Supine to sit: Supervision, HOB elevated, Used rails  General bed mobility comments: Pt quickly progresses to EOB short sit however did not perform log roll technique due to pt not having hearing aides on and 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  Discussed proper transition once hearing aides applied    Transfers Overall transfer level: Needs assistance Equipment used: Rolling walker (2 wheels) Transfers: Sit to/from Stand Sit to Stand: Supervision  General transfer comment: pt performed STS EOB and from standard toilet surface with supervision. Vcs for technique improvements but no physical assistance required.    Ambulation/Gait Ambulation/Gait assistance: Supervision Gait Distance (Feet): 200 Feet Assistive device: Rolling walker (2 wheels) Gait Pattern/deviations: Step-through pattern, Ataxic, Narrow base of support, Knee flexed in stance - left, Knee flexed in stance - right, Decreased step length - right, Decreased step length - left, Decreased stride length Gait velocity: decreased  General Gait Details: Pt tolerated gait ~ 200 ft with externally rotated LEs. NO LOB. Pt has cautious gait throughout.   Stairs Stairs: Yes Stairs assistance: Supervision Stair Management: Two rails, Step to pattern, Forwards Number of Stairs: 4 General stair comments: no physical asisstance required.    Balance Overall balance assessment: Needs assistance Sitting-balance support: Feet supported Sitting balance-Leahy Scale: Good     Standing balance support: Bilateral upper extremity supported, During functional activity, Reliant on assistive device for balance Standing balance-Leahy Scale: Good      Cognition Arousal: Alert Behavior During Therapy: WFL for tasks assessed/performed Overall Cognitive Status: Within Functional Limits for tasks assessed    General Comments: Pt is A and O x 3. Severely HOH but once hearing aide applied improved               Pertinent Vitals/Pain Pain Assessment Pain Assessment: 0-10 Pain Score: 8  Pain Location: neck Pain Descriptors / Indicators: Operative site guarding, Grimacing, Guarding Pain Intervention(s): Limited activity within  patient's tolerance, Monitored during session,  Repositioned, Ice applied     PT Goals (current goals can now be found in the care plan section) Acute Rehab PT Goals Patient Stated Goal: go home Progress towards PT goals: Progressing toward goals    Frequency    7X/week       AM-PAC PT "6 Clicks" Mobility   Outcome Measure  Help needed turning from your back to your side while in a flat bed without using bedrails?: A Little Help needed moving from lying on your back to sitting on the side of a flat bed without using bedrails?: A Little Help needed moving to and from a bed to a chair (including a wheelchair)?: None Help needed standing up from a chair using your arms (e.g., wheelchair or bedside chair)?: None Help needed to walk in hospital room?: None Help needed climbing 3-5 steps with a railing? : A Little 6 Click Score: 21    End of Session   Activity Tolerance: Patient tolerated treatment well (Gets slightly SOB with activity but O2 stable) Patient left: in chair;with call bell/phone within reach Nurse Communication: Mobility status PT Visit Diagnosis: Muscle weakness (generalized) (M62.81);Difficulty in walking, not elsewhere classified (R26.2);Pain     Time: 0737-0800 PT Time Calculation (min) (ACUTE ONLY): 23 min  Charges:    $Gait Training: 8-22 mins $Therapeutic Activity: 8-22 mins PT General Charges $$ ACUTE PT VISIT: 1 Visit                     Jetta Lout PTA 04/24/23, 8:18 AM

## 2023-04-24 NOTE — Telephone Encounter (Signed)
Authorization has been submitted through Cover My Meds and is pending.  PA Case ID #: [id number]

## 2023-04-24 NOTE — Telephone Encounter (Signed)
Call pharmacy and cancel the robaxin.  I sent zanaflex to his pharmacy for muscle spasms. Let him know this can make him sleepy.

## 2023-04-24 NOTE — Telephone Encounter (Signed)
Patty, I received a denial from [employer] for the Robaxin. Can you ask him if he has paid out of pocket for this?   Monia Pouch is requesting him to try a different muscle relaxer like Tizanidine or Chlorzoxazone for them to cover it.

## 2023-04-24 NOTE — Progress Notes (Signed)
Neurosurgery Progress Note   History: SAMIL MECHAM is s/p C3-T1 Laminoplasty w/ Dr. Katrinka Blazing on 04/21/23.   POD3: Patient reports feeling better this morning but still having some difficulty with mobility and pain control. POD2: Pt endorses continued 8-9/10 posterior neck and BUE pain exacerbated by cervical motion. Has ambulated to bathroom today with use of walker and assistance of nursing staff. Reports some feelings of some instability with ambulation.  POD1: Pt reports expected posterior neck pain that is mostly controlled with current medications.    Physical Exam:     Vitals:    04/21/23 2200 04/22/23 0353  BP: 114/65 124/67  Pulse: 65 62  Resp: 16 16  Temp: 97.7 F (36.5 C) 98.2 F (36.8 C)  SpO2: 96% 99%      AA Ox3  CNI Strength: 4+/5 throughout Incision covered with dressing. Staples intact  Data:     Latest Ref Rng & Units 04/24/2023    8:15 AM 04/23/2023    4:54 PM 04/23/2023    7:25 AM  CBC  WBC 4.0 - 10.5 K/uL 8.0   5.5   Hemoglobin 13.0 - 17.0 g/dL 8.8  7.8  6.7   Hematocrit 39.0 - 52.0 % 27.4  23.6  20.7   Platelets 150 - 400 K/uL 113   98     Assessment/Plan:   [name] is s/p C3-T1 Laminoplasty w/ Dr. Katrinka Blazing on 04/21/23. Will be reassessed by PT/OT today for ambulation.    - mobilize - pain control - DVT prophylaxis - acute blood loss anemia: Pt received one unit of RBCs yesterday. H&H trending upward and BP remains WNL - PT/OT; plan for d/c home with [facility]   Manning Charity PA-C  Neurosurgery

## 2023-04-24 NOTE — Discharge Summary (Signed)
Physician Discharge Summary  Patient ID: [name] MRN: [id number] DOB/AGE: 78/30/1947 78 y.o.  Admit date: 04/21/2023 Discharge date: 04/24/2023  Admission Diagnoses:  Discharge Diagnoses:  Principal Problem:   Spondylosis, cervical, with myelopathy Active Problems:   Cervical myelopathy (HCC)   Discharged Condition: {condition:18240}  Hospital Course: ***  Consults: {consultation:18241}  Significant Diagnostic Studies: {diagnostics:18242}  Treatments: {Tx:18249}  Discharge Exam: Blood pressure (!) 156/67, pulse 65, temperature 98.4 F (36.9 C), resp. rate 18, height 5\' 8"  (1.727 m), weight 92.5 kg, SpO2 99%. {physical FAOZ:[id number]}  Disposition:  There are no questions and answers to display.        Discharge Instructions     Incentive spirometry RT   Complete by: As directed       Allergies as of 04/24/2023       Reactions   Atorvastatin Other (See Comments)   Muscle aching   Augmentin [amoxicillin-pot Clavulanate]    C diff     Med Rec must be completed prior to using this [facility]***       Follow-up Information     Susanne Borders, PA Follow up on 05/05/2023.   Specialty: Neurosurgery Contact information: [address] [place] [address] [phone]                 Signed: Susanne Borders 04/24/2023, 10:32 AM

## 2023-04-24 NOTE — Telephone Encounter (Signed)
Patient notified and voiced understanding. Pharmacy aware to discontinue Robaxin and frill Zanaflex.

## 2023-04-24 NOTE — Telephone Encounter (Signed)
Patient said that he will try whatever medication his insurance will cover.

## 2023-04-24 NOTE — Plan of Care (Signed)
Documented

## 2023-04-25 NOTE — Plan of Care (Signed)
  Problem: Education: Goal: Ability to verbalize activity precautions or restrictions will improve Outcome: Adequate for Discharge Goal: Knowledge of the prescribed therapeutic regimen will improve Outcome: Adequate for Discharge Goal: Understanding of discharge needs will improve Outcome: Adequate for Discharge   Problem: Activity: Goal: Ability to avoid complications of mobility impairment will improve Outcome: Adequate for Discharge Goal: Ability to tolerate increased activity will improve Outcome: Adequate for Discharge Goal: Will remain free from falls Outcome: Adequate for Discharge   Problem: Bowel/Gastric: Goal: Gastrointestinal status for postoperative course will improve Outcome: Adequate for Discharge   Problem: Clinical Measurements: Goal: Ability to maintain clinical measurements within normal limits will improve Outcome: Adequate for Discharge Goal: Postoperative complications will be avoided or minimized Outcome: Adequate for Discharge Goal: Diagnostic test results will improve Outcome: Adequate for Discharge   Problem: Pain Management: Goal: Pain level will decrease Outcome: Adequate for Discharge   Problem: Skin Integrity: Goal: Will show signs of wound healing Outcome: Adequate for Discharge   Problem: Health Behavior/Discharge Planning: Goal: Identification of resources available to assist in meeting health care needs will improve Outcome: Adequate for Discharge   Problem: Bladder/Genitourinary: Goal: Urinary functional status for postoperative course will improve Outcome: Adequate for Discharge   Problem: Acute Rehab OT Goals (only OT should resolve) Goal: Pt. Will Perform Grooming Outcome: Adequate for Discharge Goal: Pt. Will Perform Lower Body Dressing Outcome: Adequate for Discharge Goal: Pt. Will Transfer To Toilet Outcome: Adequate for Discharge Goal: Pt. Will Perform Toileting-Clothing Manipulation Outcome: Adequate for Discharge    Problem: Acute Rehab PT Goals(only PT should resolve) Goal: Pt Will Go Supine/Side To Sit Outcome: Adequate for Discharge Goal: Patient Will Transfer Sit To/From Stand Outcome: Adequate for Discharge Goal: Pt Will Perform Standing Balance Or Pre-Gait Outcome: Adequate for Discharge Goal: Pt Will Ambulate Outcome: Adequate for Discharge   Problem: Education: Goal: Knowledge of General Education information will improve Description: Including pain rating scale, medication(s)/side effects and non-pharmacologic comfort measures Outcome: Adequate for Discharge   Problem: Health Behavior/Discharge Planning: Goal: Ability to manage health-related needs will improve Outcome: Adequate for Discharge   Problem: Clinical Measurements: Goal: Ability to maintain clinical measurements within normal limits will improve Outcome: Adequate for Discharge Goal: Will remain free from infection Outcome: Adequate for Discharge Goal: Diagnostic test results will improve Outcome: Adequate for Discharge Goal: Respiratory complications will improve Outcome: Adequate for Discharge Goal: Cardiovascular complication will be avoided Outcome: Adequate for Discharge   Problem: Activity: Goal: Risk for activity intolerance will decrease Outcome: Adequate for Discharge   Problem: Nutrition: Goal: Adequate nutrition will be maintained Outcome: Adequate for Discharge   Problem: Coping: Goal: Level of anxiety will decrease Outcome: Adequate for Discharge   Problem: Elimination: Goal: Will not experience complications related to bowel motility Outcome: Adequate for Discharge Goal: Will not experience complications related to urinary retention Outcome: Adequate for Discharge   Problem: Pain Managment: Goal: General experience of comfort will improve Outcome: Adequate for Discharge   Problem: Safety: Goal: Ability to remain free from injury will improve Outcome: Adequate for Discharge   Problem:  Skin Integrity: Goal: Risk for impaired skin integrity will decrease Outcome: Adequate for Discharge

## 2023-04-25 NOTE — Progress Notes (Addendum)
Neurosurgery Progress Note   History: [name] is s/p C3-T1 Laminoplasty w/ Dr. Katrinka Blazing on 04/21/23.   Doing well today.  Reports some mild neck discomfort worse with cervical rotation.  Pain controlled with prn pain meds.  Reports subjective improved strength in the extremities.  Tolerating diet   Physical Exam:     Vitals:    04/21/23 2200 04/22/23 0353  BP: 114/65 124/67  Pulse: 65 62  Resp: 16 16  Temp: 97.7 F (36.5 C) 98.2 F (36.8 C)  SpO2: 96% 99%      AA Ox3  CNI Strength: 4+/5 throughout Incision: CDI  Data:     Latest Ref Rng & Units 04/24/2023    8:15 AM 04/23/2023    4:54 PM 04/23/2023    7:25 AM  CBC  WBC 4.0 - 10.5 K/uL 8.0   5.5   Hemoglobin 13.0 - 17.0 g/dL 8.8  7.8  6.7   Hematocrit 39.0 - 52.0 % 27.4  23.6  20.7   Platelets 150 - 400 K/uL 113   98     Assessment/Plan:   [name] is s/p C3-T1 Laminoplasty w/ Dr. Katrinka Blazing on 04/21/23   - mobilize - prn pain control - PT/OT; plan for d/c home with [facility] - possible dc home today versus tomorrow pending status of spouse.  Please contact neurosurgery via epic chat for discharge order if patient requests to go home today   Erenest Rasher, MD Neurosurgery

## 2023-04-25 NOTE — Progress Notes (Signed)
Physical Therapy Treatment Patient Details Name: [name] MRN: [id number] DOB: 09-21-1945 Today's Date: 04/25/2023   History of Present Illness Pt is a 77 year old male s/p  Posterior Cervical Laminoplasty C3-T1 non 04/21/23; PMH significant for  CAD (s/p CABG), MI, atrial fibrillation, ischemic cardiomyopathy, HFimpEF, sinus pause/SSS/symptomatic bradycardia (s/p PPM placement), ischemic CVA, TIA, HTN, HLD, COPD, agent orange exposure, OSAH (requires nocturnal PAP therapy), GERD (on daily PPI), CLL, chronic malignancy related pain, OA, cervical spondylosis with myelopathy, BPH, anxiety, PTSD    PT Comments  Pt is progressing performing sit<>stands with CGA to Supervision level, bed mobility at supervision level with rail, and functional gait ( with RW and SPC ) and step negotiation at supervision level.  Pt had some imbalance when initially getting up to stand requiring cues for forward weight shift and CGA.  Continued PT with assist pt towards greater safety and independence with mobility.    If plan is discharge home, recommend the following: A little help with walking and/or transfers;A little help with bathing/dressing/bathroom   Can travel by private vehicle        Equipment Recommendations  None recommended by PT    Recommendations for Other Services       Precautions / Restrictions Precautions Precautions: Back Precaution Comments: Handout provided Restrictions Weight Bearing Restrictions: No     Mobility  Bed Mobility Overal bed mobility: Needs Assistance Bed Mobility: Supine to Sit     Supine to sit: Supervision, HOB elevated, Used rails     General bed mobility comments: Pt quickly progresses to EOB short sit did perform log roll technique due to pt not having hearing aides on.    Transfers Overall transfer level: Needs assistance Equipment used: Rolling walker (2 wheels), Straight cane Transfers: Sit to/from Stand Sit to Stand: Supervision, Contact guard  assist           General transfer comment: Initial sit<> stand, pt had LOB posteriorly requiring CGA.  Pt performed again and throughout session performed sit<>stands at surpervision level  with cues for appropriate forward weight shift to maintain balance.    Ambulation/Gait Ambulation/Gait assistance: Supervision Gait Distance (Feet): 200 Feet (+ 60 ft with SPC) Assistive device: Rolling walker (2 wheels), Straight cane Gait Pattern/deviations: Step-through pattern, Narrow base of support, Knee flexed in stance - left, Knee flexed in stance - right, Decreased step length - right, Decreased step length - left, Decreased stride length Gait velocity: decreased     General Gait Details: Pt tolerated gait ~ 200 ft  + 60 ft with SPC with externally rotated LEs. NO LOB. Pt has cautious gait throughout.   Stairs Stairs: Yes Stairs assistance: Supervision Stair Management: Two rails, Forwards, Alternating pattern Number of Stairs: 4 General stair comments: no physical asisstance required.   Wheelchair Mobility     Tilt Bed    Modified Rankin (Stroke Patients Only)       Balance Overall balance assessment: Needs assistance Sitting-balance support: Feet supported Sitting balance-Leahy Scale: Good Sitting balance - Comments: no issues   Standing balance support: During functional activity, Reliant on assistive device for balance, Single extremity supported Standing balance-Leahy Scale: Good Standing balance comment: Static standing with RW, now LOB during amb                            Cognition Arousal: Alert Behavior During Therapy: WFL for tasks assessed/performed Overall Cognitive Status: Within Functional Limits for tasks assessed  Exercises      General Comments        Pertinent Vitals/Pain Pain Assessment Pain Score: 9  Pain Location: neck Pain Descriptors / Indicators: Operative site  guarding, Grimacing, Guarding Pain Intervention(s): Monitored during session    Home Living                          Prior Function            PT Goals (current goals can now be found in the care plan section) Acute Rehab PT Goals Patient Stated Goal: go home PT Goal Formulation: With patient Time For Goal Achievement: 05/05/23 Progress towards PT goals: Progressing toward goals    Frequency    7X/week      PT Plan      Co-evaluation              AM-PAC PT "6 Clicks" Mobility   Outcome Measure  Help needed turning from your back to your side while in a flat bed without using bedrails?: A Little Help needed moving from lying on your back to sitting on the side of a flat bed without using bedrails?: A Little Help needed moving to and from a bed to a chair (including a wheelchair)?: None Help needed standing up from a chair using your arms (e.g., wheelchair or bedside chair)?: None Help needed to walk in hospital room?: None Help needed climbing 3-5 steps with a railing? : A Little 6 Click Score: 21    End of Session Equipment Utilized During Treatment: Gait belt Activity Tolerance: Patient tolerated treatment well Patient left: in chair;with call bell/phone within reach;with chair alarm set Nurse Communication: Mobility status PT Visit Diagnosis: Muscle weakness (generalized) (M62.81);Difficulty in walking, not elsewhere classified (R26.2);Pain     Time: 1478-2956 PT Time Calculation (min) (ACUTE ONLY): 27 min  Charges:    $Gait Training: 8-22 mins $Therapeutic Activity: 8-22 mins PT General Charges $$ ACUTE PT VISIT: 1 Visit                     Hortencia Conradi, PTA  04/25/23, 11:12 AM

## 2023-04-27 ENCOUNTER — Encounter: Payer: Self-pay | Admitting: Internal Medicine

## 2023-04-27 ENCOUNTER — Inpatient Hospital Stay: Payer: Medicare HMO | Attending: Internal Medicine

## 2023-04-27 ENCOUNTER — Emergency Department: Payer: No Typology Code available for payment source

## 2023-04-27 ENCOUNTER — Other Ambulatory Visit: Payer: Self-pay

## 2023-04-27 ENCOUNTER — Inpatient Hospital Stay: Payer: No Typology Code available for payment source | Admitting: Internal Medicine

## 2023-04-27 ENCOUNTER — Emergency Department
Admission: EM | Admit: 2023-04-27 | Discharge: 2023-04-27 | Disposition: A | Discharge status: Home / Self Care | Payer: No Typology Code available for payment source | Attending: Emergency Medicine | Admitting: Emergency Medicine

## 2023-04-27 VITALS — BP 70/45 | HR 59

## 2023-04-27 DIAGNOSIS — Z20822 Contact with and (suspected) exposure to covid-19: Secondary | ICD-10-CM | POA: Diagnosis not present

## 2023-04-27 DIAGNOSIS — Z8673 Personal history of transient ischemic attack (TIA), and cerebral infarction without residual deficits: Secondary | ICD-10-CM | POA: Diagnosis not present

## 2023-04-27 DIAGNOSIS — T40425A Adverse effect of tramadol, initial encounter: Secondary | ICD-10-CM | POA: Diagnosis not present

## 2023-04-27 DIAGNOSIS — T50905A Adverse effect of unspecified drugs, medicaments and biological substances, initial encounter: Secondary | ICD-10-CM

## 2023-04-27 DIAGNOSIS — Z8616 Personal history of COVID-19: Secondary | ICD-10-CM | POA: Diagnosis not present

## 2023-04-27 DIAGNOSIS — E860 Dehydration: Secondary | ICD-10-CM | POA: Diagnosis not present

## 2023-04-27 DIAGNOSIS — D649 Anemia, unspecified: Secondary | ICD-10-CM

## 2023-04-27 DIAGNOSIS — I4891 Unspecified atrial fibrillation: Secondary | ICD-10-CM | POA: Insufficient documentation

## 2023-04-27 DIAGNOSIS — C9110 Chronic lymphocytic leukemia of B-cell type not having achieved remission: Secondary | ICD-10-CM

## 2023-04-27 DIAGNOSIS — C9112 Chronic lymphocytic leukemia of B-cell type in relapse: Secondary | ICD-10-CM | POA: Insufficient documentation

## 2023-04-27 DIAGNOSIS — I959 Hypotension, unspecified: Secondary | ICD-10-CM | POA: Diagnosis not present

## 2023-04-27 DIAGNOSIS — Z79899 Other long term (current) drug therapy: Secondary | ICD-10-CM | POA: Insufficient documentation

## 2023-04-27 DIAGNOSIS — Z87891 Personal history of nicotine dependence: Secondary | ICD-10-CM | POA: Diagnosis not present

## 2023-04-27 DIAGNOSIS — R4182 Altered mental status, unspecified: Secondary | ICD-10-CM | POA: Diagnosis present

## 2023-04-27 DIAGNOSIS — Z7901 Long term (current) use of anticoagulants: Secondary | ICD-10-CM | POA: Insufficient documentation

## 2023-04-27 LAB — URINALYSIS, W/ REFLEX TO CULTURE (INFECTION SUSPECTED)
Bilirubin Urine: NEGATIVE
Glucose, UA: NEGATIVE mg/dL
Hgb urine dipstick: NEGATIVE
Ketones, ur: NEGATIVE mg/dL
Leukocytes,Ua: NEGATIVE
Nitrite: NEGATIVE
Protein, ur: NEGATIVE mg/dL
RBC / HPF: 0 RBC/hpf (ref 0–5)
Specific Gravity, Urine: 1.012 (ref 1.005–1.030)
Squamous Epithelial / HPF: 0 /[HPF] (ref 0–5)
pH: 6 (ref 5.0–8.0)

## 2023-04-27 LAB — CBC WITH DIFFERENTIAL/PLATELET
Abs Immature Granulocytes: 0.11 10*3/uL — ABNORMAL HIGH (ref 0.00–0.07)
Basophils Absolute: 0.1 10*3/uL (ref 0.0–0.1)
Basophils Relative: 1 %
Eosinophils Absolute: 0.2 10*3/uL (ref 0.0–0.5)
Eosinophils Relative: 3 %
HCT: 24.2 % — ABNORMAL LOW (ref 39.0–52.0)
Hemoglobin: 7.9 g/dL — ABNORMAL LOW (ref 13.0–17.0)
Immature Granulocytes: 2 %
Lymphocytes Relative: 32 %
Lymphs Abs: 2.4 10*3/uL (ref 0.7–4.0)
MCH: 32 pg (ref 26.0–34.0)
MCHC: 32.6 g/dL (ref 30.0–36.0)
MCV: 98 fL (ref 80.0–100.0)
Monocytes Absolute: 0.7 10*3/uL (ref 0.1–1.0)
Monocytes Relative: 9 %
Neutro Abs: 4 10*3/uL (ref 1.7–7.7)
Neutrophils Relative %: 53 %
Platelets: 127 10*3/uL — ABNORMAL LOW (ref 150–400)
RBC: 2.47 MIL/uL — ABNORMAL LOW (ref 4.22–5.81)
RDW: 17.1 % — ABNORMAL HIGH (ref 11.5–15.5)
WBC: 7.5 10*3/uL (ref 4.0–10.5)
nRBC: 0 % (ref 0.0–0.2)

## 2023-04-27 LAB — COMPREHENSIVE METABOLIC PANEL
ALT: 21 U/L (ref 0–44)
AST: 19 U/L (ref 15–41)
Albumin: 3.2 g/dL — ABNORMAL LOW (ref 3.5–5.0)
Alkaline Phosphatase: 81 U/L (ref 38–126)
Anion gap: 10 (ref 5–15)
BUN: 30 mg/dL — ABNORMAL HIGH (ref 8–23)
CO2: 25 mmol/L (ref 22–32)
Calcium: 8.2 mg/dL — ABNORMAL LOW (ref 8.9–10.3)
Chloride: 102 mmol/L (ref 98–111)
Creatinine, Ser: 1.17 mg/dL (ref 0.61–1.24)
GFR, Estimated: >60 mL/min (ref >60)
Glucose, Bld: 134 mg/dL — ABNORMAL HIGH (ref 70–99)
Potassium: 4.2 mmol/L (ref 3.5–5.1)
Sodium: 137 mmol/L (ref 135–145)
Total Bilirubin: 1 mg/dL (ref 0.3–1.2)
Total Protein: 6.1 g/dL — ABNORMAL LOW (ref 6.5–8.1)

## 2023-04-27 LAB — CBC WITH DIFFERENTIAL (CANCER CENTER ONLY)
Abs Immature Granulocytes: 0.11 10*3/uL — ABNORMAL HIGH (ref 0.00–0.07)
Basophils Absolute: 0 10*3/uL (ref 0.0–0.1)
Basophils Relative: 1 %
Eosinophils Absolute: 0.2 10*3/uL (ref 0.0–0.5)
Eosinophils Relative: 3 %
HCT: 25.8 % — ABNORMAL LOW (ref 39.0–52.0)
Hemoglobin: 8.1 g/dL — ABNORMAL LOW (ref 13.0–17.0)
Immature Granulocytes: 2 %
Lymphocytes Relative: 29 %
Lymphs Abs: 1.9 10*3/uL (ref 0.7–4.0)
MCH: 31.9 pg (ref 26.0–34.0)
MCHC: 31.4 g/dL (ref 30.0–36.0)
MCV: 101.6 fL — ABNORMAL HIGH (ref 80.0–100.0)
Monocytes Absolute: 0.6 10*3/uL (ref 0.1–1.0)
Monocytes Relative: 8 %
Neutro Abs: 3.8 10*3/uL (ref 1.7–7.7)
Neutrophils Relative %: 57 %
Platelet Count: 123 10*3/uL — ABNORMAL LOW (ref 150–400)
RBC: 2.54 MIL/uL — ABNORMAL LOW (ref 4.22–5.81)
RDW: 17.1 % — ABNORMAL HIGH (ref 11.5–15.5)
WBC Count: 6.6 10*3/uL (ref 4.0–10.5)
nRBC: 0 % (ref 0.0–0.2)

## 2023-04-27 LAB — TYPE AND SCREEN
ABO/RH(D): O NEG
Antibody Screen: NEGATIVE

## 2023-04-27 LAB — RESP PANEL BY RT-PCR (RSV, FLU A&B, COVID)  RVPGX2
Influenza A by PCR: NEGATIVE
Influenza B by PCR: NEGATIVE
Resp Syncytial Virus by PCR: NEGATIVE
SARS Coronavirus 2 by RT PCR: NEGATIVE

## 2023-04-27 LAB — IRON AND TIBC
Iron: 58 ug/dL (ref 45–182)
Saturation Ratios: 19 % (ref 17.9–39.5)
TIBC: 305 ug/dL (ref 250–450)
UIBC: 247 ug/dL

## 2023-04-27 LAB — RETIC PANEL
Immature Retic Fract: 23.8 % — ABNORMAL HIGH (ref 2.3–15.9)
RBC.: 2.53 MIL/uL — ABNORMAL LOW (ref 4.22–5.81)
Retic Count, Absolute: 112.1 10*3/uL (ref 19.0–186.0)
Retic Ct Pct: 4.4 % — ABNORMAL HIGH (ref 0.4–3.1)
Reticulocyte Hemoglobin: 29.1 pg (ref >27.9)

## 2023-04-27 LAB — CMP (CANCER CENTER ONLY)
ALT: 22 U/L (ref 0–44)
AST: 20 U/L (ref 15–41)
Albumin: 3.1 g/dL — ABNORMAL LOW (ref 3.5–5.0)
Alkaline Phosphatase: 90 U/L (ref 38–126)
Anion gap: 6 (ref 5–15)
BUN: 28 mg/dL — ABNORMAL HIGH (ref 8–23)
CO2: 24 mmol/L (ref 22–32)
Calcium: 7.9 mg/dL — ABNORMAL LOW (ref 8.9–10.3)
Chloride: 104 mmol/L (ref 98–111)
Creatinine: 1.09 mg/dL (ref 0.61–1.24)
GFR, Estimated: >60 mL/min (ref >60)
Glucose, Bld: 142 mg/dL — ABNORMAL HIGH (ref 70–99)
Potassium: 4.1 mmol/L (ref 3.5–5.1)
Sodium: 134 mmol/L — ABNORMAL LOW (ref 135–145)
Total Bilirubin: 0.8 mg/dL (ref 0.3–1.2)
Total Protein: 6 g/dL — ABNORMAL LOW (ref 6.5–8.1)

## 2023-04-27 LAB — LACTIC ACID, PLASMA: Lactic Acid, Venous: 1.5 mmol/L (ref 0.5–1.9)

## 2023-04-27 LAB — FERRITIN: Ferritin: 95 ng/mL (ref 24–336)

## 2023-04-27 LAB — PROTIME-INR
INR: 1.1 (ref 0.8–1.2)
Prothrombin Time: 14.2 s (ref 11.4–15.2)

## 2023-04-27 LAB — VITAMIN B12: Vitamin B-12: 1131 pg/mL — ABNORMAL HIGH (ref 180–914)

## 2023-04-27 LAB — LACTATE DEHYDROGENASE: LDH: 165 U/L (ref 98–192)

## 2023-04-27 MED ORDER — SODIUM CHLORIDE 0.9 % IV SOLN
2.0000 g | Freq: Once | INTRAVENOUS | Status: AC
  Administered 2023-04-27: 2 g via INTRAVENOUS
  Filled 2023-04-27: qty 12.5

## 2023-04-27 MED ORDER — ACETAMINOPHEN 500 MG PO TABS
1000.0000 mg | ORAL_TABLET | Freq: Once | ORAL | Status: AC
  Administered 2023-04-27: 1000 mg via ORAL
  Filled 2023-04-27 (×2): qty 2

## 2023-04-27 MED ORDER — METRONIDAZOLE 500 MG/100ML IV SOLN
500.0000 mg | Freq: Once | INTRAVENOUS | Status: AC
  Administered 2023-04-27: 500 mg via INTRAVENOUS
  Filled 2023-04-27: qty 100

## 2023-04-27 MED ORDER — LACTATED RINGERS IV BOLUS (SEPSIS)
1000.0000 mL | Freq: Once | INTRAVENOUS | Status: AC
  Administered 2023-04-27: 1000 mL via INTRAVENOUS

## 2023-04-27 MED ORDER — VANCOMYCIN HCL 2000 MG/400ML IV SOLN
2000.0000 mg | Freq: Once | INTRAVENOUS | Status: AC
  Administered 2023-04-27: 2000 mg via INTRAVENOUS
  Filled 2023-04-27: qty 400

## 2023-04-27 MED ORDER — VANCOMYCIN HCL IN DEXTROSE 1-5 GM/200ML-% IV SOLN
1000.0000 mg | Freq: Once | INTRAVENOUS | Status: DC
  Filled 2023-04-27: qty 200

## 2023-04-27 NOTE — ED Notes (Incomplete)
Pt family stated that this episode of unresponsiveness yesterday but came out of it pretty quickly. Family states that they believe it is because of the pain meds that he has been taking from the surgery.

## 2023-04-27 NOTE — Progress Notes (Signed)
C3-T1 Laminoplasty w/ Dr. Katrinka Blazing on 04/21/23, Cervical stenosis with myelopathy.  Pt very lethargic. Dr. B sent to ED.

## 2023-04-27 NOTE — Discharge Instructions (Signed)
STOP taking the Tramadol. DO NOT take this medication again.  I'd recommend taking TYLENOL every 4-6 hours ([phone] mg) for pain.  Drink at least 6-8 glasses of water daily.  If you develop recurrent lightheadedness, weakness, low blood pressure, return to the ER immediately

## 2023-04-27 NOTE — ED Triage Notes (Signed)
Pt to ED from cancer center for AMS, recent surgery. Pt diaphoretic. Pt not responsive to verbal stimuli. Responsive to painful stimuli. Not following commands. Not answering questions. Unknown LKW

## 2023-04-27 NOTE — Progress Notes (Signed)
Pt is very pale, clammy. Altered mental status. Lethargic. Hypotensive BP 70/45. HR 59. Staples intact to posterior neck. Pt had recent laminoplasty. Discharged for [facility] hospital yesterday. He woke up totally lethargic with altered mental status. Pt taken to ED per Dr B orders.

## 2023-04-27 NOTE — Sepsis Progress Note (Signed)
[facility]link will follow per sepsis protocol  

## 2023-04-27 NOTE — Progress Notes (Signed)
Patient discharged in the hospital yesterday post neck surgery.  Preliminary evaluation in the clinic-blood pressure 70 systolic.  Patient altered mental status-sent over the emergency room.  No charges.

## 2023-04-27 NOTE — Consult Note (Signed)
CODE SEPSIS - PHARMACY COMMUNICATION  **Broad Spectrum Antibiotics should be administered within 1 hour of Sepsis diagnosis**  Time Code Sepsis Called/Page Received: 1113  Antibiotics Ordered: Cefepime, metronidazole and Vancomycin  Time of 1st antibiotic administration: 1154  Additional action taken by pharmacy: none  If necessary, Name of Provider/Nurse Contacted: n/a    [name] PharmD, BCPS 04/27/2023 12:05 PM

## 2023-04-27 NOTE — ED Provider Notes (Signed)
[facility] Provider Note    Event Date/Time   First MD Initiated Contact with Patient 04/27/23 1105     (approximate)   History   Altered Mental Status   HPI  [name] is a 77 y.o. male here with altered mental status.  The patient has a history of CLL, recent C3-T1 surgery, and was going to his oncologist for follow-up today when he became less responsive.  He was sexually doing as well as he had been this morning per family.  He took a tramadol and they started going to his doctor.  En route, he began to feel lightheaded and weak.  He then became less responsive.  On arrival, he was hypotensive and lethargic.  Subsequent presents for evaluation.  Blood pressure was in the 70s systolic.  He has taken tramadol without any kind of symptoms similar this in the past.  He has had a mild cough.  Denies any urinary symptoms.  Has been moving his bowels.  Denies any significant increase in pain or redness from his neck.  No other complaints.  No focal weakness or numbness.     Physical Exam   Triage Vital Signs: ED Triage Vitals  Encounter Vitals Group     BP 04/27/23 1058 (!) 76/49     Systolic BP Percentile --      Diastolic BP Percentile --      Pulse Rate 04/27/23 1058 60     Resp 04/27/23 1058 12     Temp 04/27/23 1104 98.7 F (37.1 C)     Temp Source 04/27/23 1104 Oral     SpO2 04/27/23 1058 97 %     Weight 04/27/23 1058 202 lb 13.2 oz (92 kg)     Height 04/27/23 1104 5\' 8"  (1.727 m)     Head Circumference --      Peak Flow --      Pain Score 04/27/23 1104 0     Pain Loc --      Pain Education --      Exclude from Growth Chart --     Most recent vital signs: Vitals:   04/27/23 1630 04/27/23 1700  BP: (!) 166/79 (!) 139/59  Pulse: 69 60  Resp: 15 18  Temp:  97.7 F (36.5 C)  SpO2: 100% 98%     General: Awake, no distress.  CV:  Good peripheral perfusion.  Pale.  Regular.  No murmurs. Resp:  Normal work of breathing.  Lungs clear  to auscultation bilaterally. Abd:  No distention.  No tenderness.  No guarding or rebound. Other:  Overall, appears pale and weak.  No focal neurological deficits.  Cranials 2 through 12 intact.  Strength 5 and 5 upper and lower extremities were normal station light touch.  Posterior surgical site is clean, dry, intact, with no erythema or drainage.   ED Results / Procedures / Treatments   Labs (all labs ordered are listed, but only abnormal results are displayed) Labs Reviewed  COMPREHENSIVE METABOLIC PANEL - Abnormal; Notable for the following components:      Result Value   Glucose, Bld 134 (*)    BUN 30 (*)    Calcium 8.2 (*)    Total Protein 6.1 (*)    Albumin 3.2 (*)    All other components within normal limits  CBC WITH DIFFERENTIAL/PLATELET - Abnormal; Notable for the following components:   RBC 2.47 (*)    Hemoglobin 7.9 (*)    HCT  24.2 (*)    RDW 17.1 (*)    Platelets 127 (*)    Abs Immature Granulocytes 0.11 (*)    All other components within normal limits  URINALYSIS, W/ REFLEX TO CULTURE (INFECTION SUSPECTED) - Abnormal; Notable for the following components:   Color, Urine YELLOW (*)    APPearance CLEAR (*)    Bacteria, UA RARE (*)    All other components within normal limits  CULTURE, BLOOD (ROUTINE X 2)  CULTURE, BLOOD (ROUTINE X 2)  RESP PANEL BY RT-PCR (RSV, FLU A&B, COVID)  RVPGX2  LACTIC ACID, PLASMA  PROTIME-INR  LACTIC ACID, PLASMA  TYPE AND SCREEN     EKG Normal sinus rhythm, VR 60. PR 200, QRS 122, QTc 497. No acute ST elevations or depressions. No ischemia or infarct.   RADIOLOGY CXR: Clear   I also independently reviewed and agree with radiologist interpretations.   PROCEDURES:  Critical Care performed: No  .1-3 Lead EKG Interpretation  Performed by: Shaune Pollack, MD Authorized by: Shaune Pollack, MD     Interpretation: normal     ECG rate:  60-80   ECG rate assessment: normal     Rhythm: sinus rhythm     Ectopy: none      Conduction: normal   Comments:     Indication: weakness    MEDICATIONS ORDERED IN ED: Medications  lactated ringers bolus 1,000 mL (0 mLs Intravenous Stopped 04/27/23 1334)    And  lactated ringers bolus 1,000 mL (0 mLs Intravenous Stopped 04/27/23 1609)    And  lactated ringers bolus 1,000 mL (0 mLs Intravenous Stopped 04/27/23 1609)  ceFEPIme (MAXIPIME) 2 g in sodium chloride 0.9 % 100 mL IVPB (0 g Intravenous Stopped 04/27/23 1258)  metroNIDAZOLE (FLAGYL) IVPB 500 mg (0 mg Intravenous Stopped 04/27/23 1438)  vancomycin (VANCOREADY) IVPB 2000 mg/400 mL (0 mg Intravenous Stopped 04/27/23 1650)  acetaminophen (TYLENOL) tablet 1,000 mg (1,000 mg Oral Given 04/27/23 1613)     IMPRESSION / MDM / ASSESSMENT AND PLAN / ED COURSE  I reviewed the triage vital signs and the nursing notes.                              Differential diagnosis includes, but is not limited to, medication adverse effect, dehydration, metabolic encephalopathy, encephalopathy 2/2 occult sepsis (UTI, atelectasis/PNA, post-op)  Patient's presentation is most consistent with acute presentation with potential threat to life or bodily function.  The patient is on the cardiac monitor to evaluate for evidence of arrhythmia and/or significant heart rate changes  77 yo M with PMHx CLL, recent C3-T1 surgery here with hypotension and confusion. This occurred shortly after taking Tramadol for pain for his neck, and per family he was "doing okay" this morning. On arrival, pt started on IVF with marked improvement in BP and mentation. He quickly returned to baseline with no complaints. Neck surgical site appears c/d/I with no signs of infection. UA is normal. CXR is clear. He is satting well on RA. COVID, influenza negative. CMP with possible mild dehydration but is o/w unremarkable. Hgb 7.9 - this is near his baseline and he had been given a unit of blood in the hospital. No signs of active bleeding.  Pt monitored x several hours and  remains asymptomatic, well appearing, with normal BP and no symptoms. Suspect possible adverse reaction to tramadol given his o/w reassuring labs and resolution of sx. I discussed his labs with his oncologist  Dr. Racheal Patches and he will f/u Hgb as outpt. Encouraged him to avoid tramadol and f/u as outpt.    FINAL CLINICAL IMPRESSION(S) / ED DIAGNOSES   Final diagnoses:  Hypotension, unspecified hypotension type  Adverse effect of drug, initial encounter  Dehydration     Rx / DC Orders   ED Discharge Orders     None        Note:  This document was prepared using Dragon voice recognition software and may include unintentional dictation errors.   Shaune Pollack, MD 04/27/23 2001

## 2023-04-27 NOTE — Consult Note (Signed)
PHARMACY -  BRIEF ANTIBIOTIC NOTE   Pharmacy has received consult(s) for Vancomycin and Cefepime from an ED provider.  The patient's profile has been reviewed for ht/wt/allergies/indication/available labs.    One time order(s) placed for : Cefepime 2gm x 1 Vancomycin 2000mg  IV x 1   Further antibiotics/pharmacy consults should be ordered by admitting physician if indicated.                       Thank you, Tria Noguera Rodriguez-Guzman PharmD, BCPS 04/27/2023 11:30 AM

## 2023-04-28 ENCOUNTER — Telehealth: Payer: Self-pay | Admitting: *Deleted

## 2023-04-28 ENCOUNTER — Encounter: Payer: Self-pay | Admitting: Internal Medicine

## 2023-04-28 NOTE — Transitions of Care (Post Inpatient/ED Visit) (Signed)
04/28/2023  Name: [name] MRN: [id number] DOB: 1945-12-30  Today's TOC FU Call Status: Today's TOC FU Call Status:: Successful TOC FU Call Completed TOC FU Call Complete Date: 04/28/23 Patient's Name and Date of Birth confirmed.  Transition Care Management Follow-up Telephone Call Date of Discharge: 04/25/23 Discharge Facility: [facility]) Type of Discharge: Inpatient Admission Primary Inpatient Discharge Diagnosis:: Spondylosis, cervical, with myelopathy How have you been since you were released from the hospital?: Better Any questions or concerns?: No  Items Reviewed: Did you receive and understand the discharge instructions provided?: Yes Medications obtained,verified, and reconciled?: Yes (Medications Reviewed) Any new allergies since your discharge?: No Dietary orders reviewed?: No Do you have support at home?: Yes People in Home: spouse Name of Support/Comfort Primary Source: joellen  Medications Reviewed Today: Medications Reviewed Today     Reviewed by Luella Cook, RN (Case Manager) on 04/28/23 at 1543  Med List Status: <None>   Medication Order Taking? Sig Documenting Provider Last Dose Status Informant  acetaminophen (TYLENOL) 325 MG tablet [id number] Yes Take 1-2 tablets (325-650 mg total) by mouth every 4 (four) hours as needed for mild pain. Setzer, Lynnell Jude, PA-C Taking Active Spouse/Significant Other  albuterol (VENTOLIN HFA) 108 (90 Base) MCG/ACT inhaler [id number] Yes Inhale 2 puffs into the lungs every 6 (six) hours as needed for wheezing or shortness of breath. Setzer, Lynnell Jude, PA-C Taking Active Spouse/Significant Other  amiodarone (PACERONE) 200 MG tablet [id number] Yes Take 1/2 tablet (100 mg) by mouth twice daily 5 days a week  Patient taking differently: Take 100 mg by mouth See admin instructions. Take 1/2 tablet (100 mg) by mouth twice daily-takes 5 days a week. Iona Hansen, [place], Fri, Sat   Duke Salvia, MD  Taking Active Spouse/Significant Other  Apple Cider Vinegar 500 MG TABS [id number] Yes Take 1 tablet by mouth daily. [provider] Taking Active Spouse/Significant Other  azelastine (ASTELIN) 0.1 % nasal spray [id number] Yes Place 2 sprays into both nostrils 2 (two) times daily. Use in each nostril as directed Bethanie Dicker, NP Taking Active Spouse/Significant Other  cetirizine (ZYRTEC) 10 MG tablet [id number] Yes Take 10 mg by mouth daily as needed for allergies.  [provider] Taking Active Spouse/Significant Other  Cholecalciferol (VITAMIN D3) 250 MCG (10000 UT) capsule [id number] Yes Take 10,000 Units by mouth daily. [provider] Taking Active Spouse/Significant Other  Coenzyme Q10 (COQ10) 200 MG CAPS [id number] Yes Take 200 mg by mouth daily. [provider] Taking Active Spouse/Significant Other  cyanocobalamin (VITAMIN B12) 1000 MCG tablet [id number] Yes Take 1,000 mcg by mouth daily. [provider] Taking Active Spouse/Significant Other  Evolocumab (REPATHA SURECLICK) 140 MG/ML SOAJ [id number] Yes Inject 140 mg into the skin every 14 (fourteen) days. Antonieta Iba, MD Taking Active Spouse/Significant Other  ezetimibe (ZETIA) 10 MG tablet [id number] Yes Take 1 tablet (10 mg total) by mouth daily.  Patient taking differently: Take 10 mg by mouth every morning.   Antonieta Iba, MD Taking Active Spouse/Significant Other  ferrous sulfate 325 (65 FE) MG EC tablet [id number] Yes Take 325 mg by mouth daily. [provider] Taking Active Spouse/Significant Other  fluticasone-salmeterol [facility] INHUB) 100-50 MCG/ACT AEPB [id number] Yes Inhale 2 puffs into the lungs 2 (two) times daily. [provider] Taking Active Spouse/Significant Other  mirtazapine (REMERON) 15 MG tablet [id number] Yes Take 15 mg by mouth at bedtime as needed (for panic associated with PTSD). [provider] Taking Active Spouse/Significant Other  mometasone-formoterol (DULERA) 200-5 MCG/ACT AERO [id number] Yes Inhale 2 puffs into the lungs 2 (two) times daily. [provider] Taking Active Spouse/Significant Other  Multiple Vitamin (MULTIVITAMIN WITH MINERALS) TABS tablet [id number] Yes Take 1 tablet by mouth daily. [provider] Taking Active Spouse/Significant Other  oxyCODONE (OXY IR/ROXICODONE) 5 MG immediate release tablet [id number] No Take 1 tablet (5 mg total) by mouth every 4 (four) hours as needed for up to 5 days for moderate pain ((score 4 to 6)).  Patient not taking: Reported on 04/28/2023   Susanne Borders, [place] Not Taking Active   pantoprazole (PROTONIX) 40 MG tablet [id number] Yes TAKE 1 TABLET(40 MG) BY MOUTH DAILY Glori Luis, MD Taking Active Spouse/Significant Other  senna (SENOKOT) 8.6 MG TABS tablet [id number] Yes Take 1 tablet (8.6 mg total) by mouth daily as needed for mild constipation. Susanne Borders, PA Taking Active   tamsulosin [facility]) 0.4 MG CAPS capsule [id number] Yes Take 1 capsule (0.4 mg total) by mouth daily.  Patient taking differently: Take 0.4 mg by mouth every evening.   Glori Luis, MD Taking Active Spouse/Significant Other  Tiotropium Bromide Monohydrate 2.5 MCG/ACT AERS [id number] Yes Inhale 2 puffs into the lungs at bedtime. Spiriva [provider] Taking Active Spouse/Significant Other  tiZANidine (ZANAFLEX) 4 MG capsule [id number] Yes Take 1 capsule (4 mg total) by mouth 3 (three) times daily. This can make you sleepy. Drake Leach, PA-C Taking Active   Med List Note Remi Haggard, RPH-CPP 08/28/22 1057): Pt also uses the [place] in [place], [place].   [place] filled at [facility] (Specialty)            Home Care and Equipment/Supplies: Were Home Health Services Ordered?: NA Any new equipment or medical supplies ordered?: NA  Functional Questionnaire: Do you need assistance with bathing/showering or dressing?: Yes Do you need assistance  with meal preparation?: Yes Do you need assistance with eating?: No Do you have difficulty maintaining continence: No Do you need assistance with getting out of bed/getting out of a chair/moving?: No Do you have difficulty managing or taking your medications?: No  Follow up appointments reviewed: PCP Follow-up appointment confirmed?: NA Specialist Hospital Follow-up appointment confirmed?: Yes Date of Specialist follow-up appointment?: 05/05/23 Follow-Up Specialty Provider:: Manning Charity Do you need transportation to your follow-up appointment?: No Do you understand care options if your condition(s) worsen?: Yes-patient verbalized understanding  SDOH Interventions Today    Flowsheet Row Most Recent Value  SDOH Interventions   Food Insecurity Interventions Intervention Not Indicated  Housing Interventions Intervention Not Indicated  Transportation Interventions Intervention Not Indicated, Patient Resources (Friends/Family)  Utilities Interventions Intervention Not Indicated      Interventions Today    Flowsheet Row Most Recent Value  General Interventions   General Interventions Discussed/Reviewed General Interventions Discussed, General Interventions Reviewed, Doctor Visits  Doctor Visits Discussed/Reviewed Doctor Visits Discussed, Doctor Visits Reviewed, Specialist  PCP/Specialist Visits Compliance with follow-up visit  Exercise Interventions   Exercise Discussed/Reviewed Exercise Discussed, Physical Activity  [ambulating/ using incentive spirometry]  Physical Activity Discussed/Reviewed Physical Activity Discussed  [no lifting greater than 10 lbs]  Pharmacy Interventions   Pharmacy Dicussed/Reviewed Pharmacy Topics Discussed, Pharmacy Topics Reviewed      TOC Interventions Today    Flowsheet Row Most Recent Value  TOC Interventions   TOC Interventions Discussed/Reviewed TOC Interventions Discussed, TOC Interventions Reviewed  [RN discussed recent ED visit and patient  discontinuing pain medications and taking tylenol]      Patient has  agreed to follow up outreach [phone] after neurosurgeon visit.  Gean Maidens BSN RN [facility] [phone]

## 2023-04-30 ENCOUNTER — Other Ambulatory Visit (HOSPITAL_COMMUNITY): Payer: Self-pay

## 2023-05-02 LAB — CULTURE, BLOOD (ROUTINE X 2)
Culture: NO GROWTH
Culture: NO GROWTH

## 2023-05-05 ENCOUNTER — Ambulatory Visit (INDEPENDENT_AMBULATORY_CARE_PROVIDER_SITE_OTHER): Payer: Medicare HMO | Admitting: Neurosurgery

## 2023-05-05 ENCOUNTER — Ambulatory Visit (INDEPENDENT_AMBULATORY_CARE_PROVIDER_SITE_OTHER): Payer: Medicare HMO

## 2023-05-05 ENCOUNTER — Encounter: Payer: Self-pay | Admitting: Neurosurgery

## 2023-05-05 ENCOUNTER — Other Ambulatory Visit: Payer: Self-pay

## 2023-05-05 VITALS — BP 108/62 | Temp 97.9°F | Ht 68.0 in | Wt 196.0 lb

## 2023-05-05 DIAGNOSIS — G959 Disease of spinal cord, unspecified: Secondary | ICD-10-CM

## 2023-05-05 DIAGNOSIS — M4712 Other spondylosis with myelopathy, cervical region: Secondary | ICD-10-CM | POA: Diagnosis not present

## 2023-05-05 DIAGNOSIS — Z09 Encounter for follow-up examination after completed treatment for conditions other than malignant neoplasm: Secondary | ICD-10-CM

## 2023-05-05 DIAGNOSIS — I495 Sick sinus syndrome: Secondary | ICD-10-CM | POA: Diagnosis not present

## 2023-05-05 NOTE — Progress Notes (Signed)
   REFERRING PHYSICIAN:  Nicholaus Corolla [address] [place],  [place] [address]  DOS: 04/21/23 C3-T1 laminoplasty   HISTORY OF PRESENT ILLNESS: [name] is about 2 weeks status post laminoplasty for cervical stenosis and myelopathy. Overall, he is doing well post-operatively despite having to return to the hospital for altered mental status and hypotension on 04/27/2023.  He was given IV fluids and ultimately discharged home. Today he reports significant improvement to his balance.   PHYSICAL EXAMINATION:  NEUROLOGICAL:  General: In no acute distress.   Awake, alert, oriented to person, place, and time.  Pupils equal round and reactive to light.  Facial tone is symmetric.  Tongue protrusion is midline.  There is no pronator drift.  Strength: Side Biceps Triceps Deltoid Interossei Grip Wrist Ext. Wrist Flex.  R 5 5 5 5 5 5 5   L 5 5 5 5 5 5 5    Incision c/d/I and healing well   Imaging:  No interval imaging to review today  Assessment / Plan: [name] is doing well after his recent laminoplasty.  He has stopped taking oxycodone due to a reaction.  We discussed activity escalation and I have advised the patient to lift up to 10 pounds until 6 weeks after surgery, then increase up to 25 pounds until 12 weeks after surgery.  After 12 weeks post-op, the patient advised to increase activity as tolerated. he will return to clinic in approximately 4 weeks with Dr. Katrinka Blazing or sooner should he have any questions or concerns.    Advised to contact the office if any questions or concerns arise.   Manning Charity PA-C Dept of Neurosurgery

## 2023-05-06 ENCOUNTER — Other Ambulatory Visit: Payer: Self-pay | Admitting: *Deleted

## 2023-05-06 ENCOUNTER — Ambulatory Visit: Payer: Self-pay | Admitting: *Deleted

## 2023-05-06 ENCOUNTER — Encounter: Payer: Medicare HMO | Admitting: Neurosurgery

## 2023-05-06 DIAGNOSIS — C9110 Chronic lymphocytic leukemia of B-cell type not having achieved remission: Secondary | ICD-10-CM

## 2023-05-06 DIAGNOSIS — D649 Anemia, unspecified: Secondary | ICD-10-CM

## 2023-05-06 LAB — CUP PACEART REMOTE DEVICE CHECK
Battery Remaining Longevity: 32 mo
Battery Voltage: 2.95 V
Brady Statistic AP VP Percent: 0.09 %
Brady Statistic AP VS Percent: 65.43 %
Brady Statistic AS VP Percent: 0.04 %
Brady Statistic AS VS Percent: 34.44 %
Brady Statistic RA Percent Paced: 65.49 %
Brady Statistic RV Percent Paced: 0.13 %
Date Time Interrogation Session: 20241015090242
Implantable Lead Connection Status: 753985
Implantable Lead Connection Status: 753985
Implantable Lead Implant Date: 20170814
Implantable Lead Implant Date: 20170814
Implantable Lead Location: 753859
Implantable Lead Location: 753860
Implantable Lead Model: 5076
Implantable Lead Model: 5076
Implantable Pulse Generator Implant Date: 20170814
Lead Channel Impedance Value: 323 Ohm
Lead Channel Impedance Value: 418 Ohm
Lead Channel Impedance Value: 437 Ohm
Lead Channel Impedance Value: 513 Ohm
Lead Channel Pacing Threshold Amplitude: 0.625 V
Lead Channel Pacing Threshold Amplitude: 0.75 V
Lead Channel Pacing Threshold Pulse Width: 0.4 ms
Lead Channel Pacing Threshold Pulse Width: 0.4 ms
Lead Channel Sensing Intrinsic Amplitude: 1.5 mV
Lead Channel Sensing Intrinsic Amplitude: 1.5 mV
Lead Channel Sensing Intrinsic Amplitude: 15.5 mV
Lead Channel Sensing Intrinsic Amplitude: 15.5 mV
Lead Channel Setting Pacing Amplitude: 2 V
Lead Channel Setting Pacing Amplitude: 2.5 V
Lead Channel Setting Pacing Pulse Width: 0.4 ms
Lead Channel Setting Sensing Sensitivity: 0.9 mV
Zone Setting Status: 755011
Zone Setting Status: 755011

## 2023-05-06 NOTE — Patient Outreach (Signed)
Care Management  Transitions of Care Program Transitions of Care Post-discharge week 2   05/06/2023 Name: [name] MRN: [id number] DOB: [birth year]  Subjective: [name] is a 77 y.o. year old male who is a primary care patient of Birdie Sons, Yehuda Mao, MD. The Care Management team Engaged with patient Engaged with patient by telephone to assess and address transitions of care needs.   Consent to Services:  Patient was given information about care management services, agreed to services, and gave verbal consent to participate.   Assessment:   RN discussed with patient about his blood pressure and monitoring. Patient blood pressure is running 108/62. He stated he feels much better. He attended his neurosurgeon visit yesterday and is progressing. Patient is adhering to medication guidelines. He is increasing his ambulation.        SDOH Interventions    Flowsheet Row Telephone from 04/28/2023 in [facility] Clinical Support from 09/12/2022 in [facility] from 01/30/2022 in [facility]  SDOH Interventions     Food Insecurity Interventions Intervention Not Indicated Intervention Not Indicated Intervention Not Indicated  Housing Interventions Intervention Not Indicated Intervention Not Indicated Intervention Not Indicated  Transportation Interventions Intervention Not Indicated, Patient Resources (Friends/Family) Intervention Not Indicated Intervention Not Indicated, Patient Resources (Friends/Family)  Utilities Interventions Intervention Not Indicated Intervention Not Indicated --  Financial Strain Interventions -- Intervention Not Indicated Intervention Not Indicated  Physical Activity Interventions -- Intervention Not Indicated --  Stress Interventions -- Intervention Not Indicated --  Social Connections Interventions -- Intervention Not Indicated  --        Goals Addressed   None     Plan:  Patient feels he has progressed well and declined further outreach calls.  Gean Maidens BSN RN [facility] [phone]

## 2023-05-07 ENCOUNTER — Telehealth: Payer: Self-pay

## 2023-05-07 ENCOUNTER — Inpatient Hospital Stay: Payer: Medicare HMO

## 2023-05-07 ENCOUNTER — Inpatient Hospital Stay (HOSPITAL_BASED_OUTPATIENT_CLINIC_OR_DEPARTMENT_OTHER): Payer: Medicare HMO | Admitting: Nurse Practitioner

## 2023-05-07 ENCOUNTER — Other Ambulatory Visit: Payer: Self-pay

## 2023-05-07 ENCOUNTER — Encounter: Payer: Self-pay | Admitting: Nurse Practitioner

## 2023-05-07 ENCOUNTER — Other Ambulatory Visit (HOSPITAL_COMMUNITY): Payer: Self-pay

## 2023-05-07 ENCOUNTER — Encounter: Payer: Self-pay | Admitting: Internal Medicine

## 2023-05-07 VITALS — BP 125/67 | HR 61 | Temp 98.7°F | Resp 18 | Ht 68.0 in | Wt 197.8 lb

## 2023-05-07 DIAGNOSIS — C9112 Chronic lymphocytic leukemia of B-cell type in relapse: Secondary | ICD-10-CM | POA: Diagnosis not present

## 2023-05-07 DIAGNOSIS — D649 Anemia, unspecified: Secondary | ICD-10-CM

## 2023-05-07 DIAGNOSIS — C9110 Chronic lymphocytic leukemia of B-cell type not having achieved remission: Secondary | ICD-10-CM

## 2023-05-07 DIAGNOSIS — Z5181 Encounter for therapeutic drug level monitoring: Secondary | ICD-10-CM

## 2023-05-07 LAB — CMP (CANCER CENTER ONLY)
ALT: 13 U/L (ref 0–44)
AST: 19 U/L (ref 15–41)
Albumin: 3.4 g/dL — ABNORMAL LOW (ref 3.5–5.0)
Alkaline Phosphatase: 78 U/L (ref 38–126)
Anion gap: 5 (ref 5–15)
BUN: 13 mg/dL (ref 8–23)
CO2: 22 mmol/L (ref 22–32)
Calcium: 8.1 mg/dL — ABNORMAL LOW (ref 8.9–10.3)
Chloride: 109 mmol/L (ref 98–111)
Creatinine: 1.11 mg/dL (ref 0.61–1.24)
GFR, Estimated: >60 mL/min (ref >60)
Glucose, Bld: 109 mg/dL — ABNORMAL HIGH (ref 70–99)
Potassium: 3.9 mmol/L (ref 3.5–5.1)
Sodium: 136 mmol/L (ref 135–145)
Total Bilirubin: 1 mg/dL (ref 0.3–1.2)
Total Protein: 6.3 g/dL — ABNORMAL LOW (ref 6.5–8.1)

## 2023-05-07 LAB — CBC WITH DIFFERENTIAL (CANCER CENTER ONLY)
Abs Immature Granulocytes: 0.06 10*3/uL (ref 0.00–0.07)
Basophils Absolute: 0 10*3/uL (ref 0.0–0.1)
Basophils Relative: 1 %
Eosinophils Absolute: 0.4 10*3/uL (ref 0.0–0.5)
Eosinophils Relative: 4 %
HCT: 28.2 % — ABNORMAL LOW (ref 39.0–52.0)
Hemoglobin: 9 g/dL — ABNORMAL LOW (ref 13.0–17.0)
Immature Granulocytes: 1 %
Lymphocytes Relative: 36 %
Lymphs Abs: 2.8 10*3/uL (ref 0.7–4.0)
MCH: 32.4 pg (ref 26.0–34.0)
MCHC: 31.9 g/dL (ref 30.0–36.0)
MCV: 101.4 fL — ABNORMAL HIGH (ref 80.0–100.0)
Monocytes Absolute: 1.6 10*3/uL — ABNORMAL HIGH (ref 0.1–1.0)
Monocytes Relative: 21 %
Neutro Abs: 3 10*3/uL (ref 1.7–7.7)
Neutrophils Relative %: 37 %
Platelet Count: 147 10*3/uL — ABNORMAL LOW (ref 150–400)
RBC: 2.78 MIL/uL — ABNORMAL LOW (ref 4.22–5.81)
RDW: 17.2 % — ABNORMAL HIGH (ref 11.5–15.5)
WBC Count: 8 10*3/uL (ref 4.0–10.5)
nRBC: 0 % (ref 0.0–0.2)

## 2023-05-07 LAB — LACTATE DEHYDROGENASE: LDH: 205 U/L — ABNORMAL HIGH (ref 98–192)

## 2023-05-07 LAB — RETIC PANEL
Immature Retic Fract: 26.5 % — ABNORMAL HIGH (ref 2.3–15.9)
RBC.: 2.8 MIL/uL — ABNORMAL LOW (ref 4.22–5.81)
Retic Count, Absolute: 111.7 10*3/uL (ref 19.0–186.0)
Retic Ct Pct: 4 % — ABNORMAL HIGH (ref 0.4–3.1)
Reticulocyte Hemoglobin: 33.4 pg (ref >27.9)

## 2023-05-07 LAB — IRON AND TIBC
Iron: 87 ug/dL (ref 45–182)
Saturation Ratios: 24 % (ref 17.9–39.5)
TIBC: 358 ug/dL (ref 250–450)
UIBC: 271 ug/dL

## 2023-05-07 LAB — FERRITIN: Ferritin: 72 ng/mL (ref 24–336)

## 2023-05-07 NOTE — Progress Notes (Signed)
[facility] OFFICE PROGRESS NOTE  Patient Care Team: Glori Luis, MD as PCP - General (Family Medicine) Antonieta Iba, MD as PCP - Cardiology (Cardiology) Duke Salvia, MD as PCP - Electrophysiology (Cardiology) Antonieta Iba, MD as Consulting Physician (Cardiology) Earna Coder, MD as Medical Oncologist (Hematology and Oncology) Keitha Butte, RN as Registered Nurse (Oncology)   Cancer Staging  No matching staging information was found for the patient.  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  MARCH 2017- CT N/C/A/P- NED. STOP Ritux; surveillance   # AUG 2019- CT/PET- progression/NO transformation;  # NOV 2019- Progression; started Ibrutinib 420 mg/d. STOPPED in end of feb sec to extreme fatigue/joint pains/cramps  #November 01, 2018-start ibrutinib 280 mg a day; December 20, 2018-discontinue ibrutinib secondary multiple side effects.   #January 03, 2019 start Gazyva + Ven [July]; finished Dec 2020-; started Peter Minium maintenance [200 mg a day; DI-amiodarone]; SEP 2022- congestive heart failure; s/p cardiac cath-noted to have CAD however no stents placed.  2D echo- EF-55%  #Mid March 2021-venetoclax dose reduced to 100 mg [second diarrhea]. HELD in DEC 2022/JAN 2023 x3 months-multiple admissions to the hospital/pneumonia infection stroke x 3 m.   # MAY 2023-progression of disease in the neck ches; Nov 18, 2021-RE-started venetoclax;   # JUNE 29th, 2023-progression of disease in the neck- STOP VENAOCLAX;   # AUG 16th, 2023 START ACALABRUTINIB 100 mg BID [previous intolerance to ibrutinib; delayed sec to C.diff]    #? 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  Ambulating with a cane.   [name] 77 y.o.  male CLL-high risk of recurrent/relapsed currently on  alcabrutinib is here for a follow up.   In interim, he had covid followed by hospitalization for campylobacter enteritis. He then had spine surgery with admission. Acal was held for one week prior and one week following. He was transferred from clinic to ER at previous visit due to altered mental status and hypotension in 70s. He received IVF and antibiotics and was discharged home same day. Acalabrutinib has been held since mostly over past couple of months. Neck nodules have enlarged. He has some night sweats. Appetite is normal. Fatigue is stable. Denies rash. He has chronic aches and pains which are unchanged. Denies black or bloody stools.   Review of Systems  Constitutional:  Positive for malaise/fatigue. Negative for chills, diaphoresis and fever.  HENT:  Negative for nosebleeds and sore throat.   Eyes:  Negative for double vision.  Respiratory:  Negative for hemoptysis, shortness of breath and wheezing.   Cardiovascular:  Negative for chest pain, palpitations, orthopnea and leg swelling.  Gastrointestinal:  Negative for abdominal pain, blood in stool, constipation, heartburn, melena, nausea and vomiting.  Genitourinary:  Negative for dysuria, frequency and urgency.  Musculoskeletal:  Positive for back pain, joint pain and myalgias.  Skin: Negative.  Negative for itching and rash.  Neurological:   Negative for dizziness, tingling, focal weakness and headaches.  Psychiatric/Behavioral:  Negative for depression. The patient is not nervous/anxious and does not have insomnia.    PAST MEDICAL HISTORY :  Past Medical History:  Diagnosis Date   Agent orange exposure    Anxiety    Arthritis    Atrial fibrillation (HCC) 2013   a.) Dx'd in 2013; recurrence 02/2014; b.) CHA2DS2-VASc = 7 (age x2, HFimpEF, HTN, CVA x 2, previous MI); c.) cardiac rate/rhythm maintained on oral amiodarone; chronically anticoagulated using apixaban   BPH (benign prostatic hyperplasia)    C. 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)    a.) MV 08/23/2015: EF 50%; b.) TTE 08/23/2015: EF 55-60%, G1DD; c.) TTE 11/02/2015: EF 55-60%, mild LA dil; d.) TTE 02/04/2020: EF 55-60%, mild LVH, G1DD, PASP 44.3, mild-mod AoV sclerosis; e.) MV 12/25/2020: EF 45-54%; f.)  R/LHC 02/04/2021: mRA 13, mPA 27, mPCWP 22, LVEDP 23, Ao sat 99, PA sat 70, CO 6.5, CI 3.0; g.) TTE 02/19/2021: EF 55-60%, G1DD, mildly red RVSF, triv MR, mild-mod AoV scler   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 EF 55%, mild antlat HK;  c.) s/p R/LHC 02/04/2021: LM mild diff dzs, LAD 100p, LCX nl, RCA 50p, 95d, 99 side branch, RPAV fills via LCx. SVG->RPDA 100, SVG->OM1 100, LIMA->LAD patent. EF 35-45%.   GERD (gastroesophageal reflux disease)    H/O tobacco use, presenting  hazards to health    History of 2019 novel coronavirus disease (COVID-19) 09/04/2021   a.) PCR (+) 09/04/2021, 09/13/2021; b.) home rapid Ag (+) 03/10/2023   HOH (hard of hearing) - requires BILATERAL hearing aids    Hyperlipidemia LDL goal <70    Hypertension    Hypokalemia    Ischemic cardiomyopathy    a.) MV 08/23/2015: EF 50%; b.) TTE 08/23/2015: EF 55-60%; c.) TTE 11/02/2015: EF 55-60%; d.) TTE 02/04/2020: EF 55-60%; e.) MV 12/25/2020: EF 45-54%; f.) R/LHC 02/04/2021: mRA 13, mPA 27, mPCWP 22, LVEDP 23, Ao sat 99, PA sat 70, CO 6.5, CI 3.0; g.) TTE 02/19/2021: EF 55-60%   Ischemic cerebrovascular accident (CVA) (HCC) 10/18/2021   Myocardial infarction (HCC) 2010   On amiodarone therapy    On apixaban therapy    OSA on CPAP    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   TIA (transient ischemic attack) 11/02/2015   PAST SURGICAL HISTORY :   Past Surgical History:  Procedure Laterality Date   ABDOMINAL HERNIA REPAIR     APPENDECTOMY  06/21/1985   CORONARY ARTERY BYPASS GRAFT N/A 05/09/2009   EP IMPLANTABLE DEVICE N/A 03/03/2016   Procedure: Pacemaker Implant;  Surgeon: Duke Salvia, MD;  Location: [facility] INVASIVE CV LAB;  Service: Cardiovascular;  Laterality: N/A;   FOREIGN BODY REMOVAL  1968   "shrapnel in my tailbone"   INGUINAL HERNIA REPAIR Right    JOINT REPLACEMENT Right 2018   LAPAROSCOPIC CHOLECYSTECTOMY      LEFT HEART CATH AND CORONARY ANGIOGRAPHY Left 05/08/2009   Procedure: LEFT HEART CATH AND CORONARY ANGIOGRAPHY; Location: [facility]: Surgeon: Lorine Bears, MD   LEFT HEART CATH AND CORS/GRAFTS ANGIOGRAPHY Left 10/17/2009   Procedure: LEFT HEART CATH AND CORS/GRAFTS ANGIOGRAPHY; Location: [facility]; Surgeon: Lorine Bears, MD   RIGHT/LEFT HEART CATH AND CORONARY/GRAFT ANGIOGRAPHY N/A 02/04/2021   Procedure: RIGHT/LEFT HEART CATH AND CORONARY/GRAFT ANGIOGRAPHY;  Surgeon: Yvonne Kendall, MD;  Location: [facility] INVASIVE CV LAB;  Service: Cardiovascular;  Laterality: N/A;   TONSILLECTOMY AND ADENOIDECTOMY  1956   TOTAL HIP ARTHROPLASTY Right 10/22/2016   Procedure: TOTAL HIP ARTHROPLASTY;  Surgeon: Donato Heinz, MD;  Location: [facility] ORS;  Service: Orthopedics;  Laterality: Right;   TOTAL HIP ARTHROPLASTY Left 11/04/2017   Procedure: TOTAL HIP ARTHROPLASTY;  Surgeon: Donato Heinz, MD;  Location: [facility] ORS;  Service: Orthopedics;  Laterality: Left;    FAMILY HISTORY :   Family History  Problem Relation Age of Onset   Heart disease Mother    Heart attack Mother    Coronary artery disease Other        family history    SOCIAL HISTORY:   Social History   Tobacco Use   Smoking status: Former    Current packs/day: 0.00    Average packs/day: 1 pack/day for 40.0 years (40.0 ttl pk-yrs)    Types: Cigarettes    Start date: 07/21/1966    Quit date: 07/21/2006    Years since quitting: 16.8   Smokeless tobacco: Never  Vaping Use   Vaping status: Former  Substance Use Topics   Alcohol use: Not Currently   Drug use: No    ALLERGIES:  is allergic to atorvastatin and augmentin [amoxicillin-pot clavulanate].  MEDICATIONS:  Current Outpatient Medications  Medication Sig Dispense Refill   acetaminophen (TYLENOL) 325 MG tablet Take 1-2 tablets (325-650 mg total) by mouth every 4 (four) hours as needed for mild pain.     albuterol (VENTOLIN HFA) 108 (90 Base) MCG/ACT inhaler Inhale 2 puffs into the lungs  every 6 (six) hours as needed for wheezing or shortness of breath. 1 each 0   amiodarone (PACERONE) 200 MG tablet Take 1/2 tablet (100 mg) by mouth twice daily 5 days a week (Patient taking differently: Take 100 mg by mouth See admin instructions. Take 1/2 tablet (100 mg) by mouth twice daily-takes 5 days a week. Mon, Tues, Thurs, Fri, Sat) 60 tablet 6   Apple Cider Vinegar 500 MG TABS Take 1 tablet by mouth daily.     azelastine (ASTELIN) 0.1 % nasal spray Place 2 sprays into both nostrils 2 (two) times daily. Use in each  nostril as directed 30 mL 1   cetirizine (ZYRTEC) 10 MG tablet Take 10 mg by mouth daily as needed for allergies.      Cholecalciferol (VITAMIN D3) 250 MCG (10000 UT) capsule Take 10,000 Units by mouth daily.     Coenzyme Q10 (COQ10) 200 MG CAPS Take 200 mg by mouth daily.     cyanocobalamin (VITAMIN B12) 1000 MCG tablet Take 1,000 mcg by mouth daily.     Evolocumab (REPATHA SURECLICK) 140 MG/ML SOAJ Inject 140 mg into the skin every 14 (fourteen) days. 2 mL 11   ezetimibe (ZETIA) 10 MG tablet Take 1 tablet (10 mg total) by mouth daily. (Patient taking differently: Take 10 mg by mouth every morning.) 90 tablet 3   ferrous sulfate 325 (65 FE) MG EC tablet Take 325 mg by mouth daily.     fluticasone-salmeterol (WIXELA INHUB) 100-50 MCG/ACT AEPB Inhale 2 puffs into the lungs 2 (two) times daily.     mirtazapine (REMERON) 15 MG tablet Take 15 mg by mouth at bedtime as needed (for panic associated with PTSD).     mometasone-formoterol (DULERA) 200-5 MCG/ACT AERO Inhale 2 puffs into the lungs 2 (two) times daily.     Multiple Vitamin (MULTIVITAMIN WITH MINERALS) TABS tablet Take 1 tablet by mouth daily.     pantoprazole (PROTONIX) 40 MG tablet TAKE 1 TABLET(40 MG) BY MOUTH DAILY 90 tablet 1   senna (SENOKOT) 8.6 MG TABS tablet Take 1 tablet (8.6 mg total) by mouth daily as needed for mild constipation. 30 tablet 0   tamsulosin (FLOMAX) 0.4 MG CAPS capsule Take 1 capsule (0.4 mg total)  by mouth daily. (Patient taking differently: Take 0.4 mg by mouth every evening.) 30 capsule 3   Tiotropium Bromide Monohydrate 2.5 MCG/ACT AERS Inhale 2 puffs into the lungs at bedtime. Spiriva     tiZANidine (ZANAFLEX) 4 MG capsule Take 1 capsule (4 mg total) by mouth 3 (three) times daily. This can make you sleepy. 60 capsule 0   No current facility-administered medications for this visit.   Facility-Administered Medications Ordered in Other Visits  Medication Dose Route Frequency Provider Last Rate Last Admin   potassium chloride 10 mEq in 100 mL IVPB  10 mEq Intravenous Once Borders, Daryl Eastern, NP        PHYSICAL EXAMINATION: ECOG PERFORMANCE STATUS: 1 - Symptomatic but completely ambulatory  BP 125/67   Pulse 61   Temp 98.7 F (37.1 C) (Tympanic)   Resp 18   Ht 5\' 8"  (1.727 m)   Wt 197 lb 12.8 oz (89.7 kg)   SpO2 97%   BMI 30.08 kg/m   Filed Weights   05/07/23 0945  Weight: 197 lb 12.8 oz (89.7 kg)   Physical Exam Constitutional:      Appearance: He is not ill-appearing.  HENT:     Head: Normocephalic and atraumatic.     Mouth/Throat:     Pharynx: No oropharyngeal exudate.  Cardiovascular:     Rate and Rhythm: Normal rate and regular rhythm.  Pulmonary:     Effort: No respiratory distress.     Breath sounds: No wheezing.  Abdominal:     General: There is no distension.     Palpations: Abdomen is soft.     Tenderness: There is no abdominal tenderness. There is no guarding.  Musculoskeletal:        General: No tenderness.     Cervical back: Neck supple.  Lymphadenopathy:     Cervical: Cervical adenopathy present.  Skin:    General: Skin is warm.  Neurological:     Mental Status: He is alert and oriented to person, place, and time.  Psychiatric:        Mood and Affect: Mood and affect normal.        Behavior: Behavior normal.     LABORATORY DATA:  I have reviewed the data as listed Lab Results  Component Value Date   WBC 7.5 04/27/2023   NEUTROABS  4.0 04/27/2023   HGB 7.9 (L) 04/27/2023   HCT 24.2 (L) 04/27/2023   MCV 98.0 04/27/2023   PLT 127 (L) 04/27/2023     Chemistry      Component Value Date/Time   NA 137 04/27/2023 1127   NA 143 03/11/2021 1136   NA 139 10/11/2014 1800   K 4.2 04/27/2023 1127   K 3.3 (L) 10/11/2014 1800   CL 102 04/27/2023 1127   CL 106 10/11/2014 1800   CO2 25 04/27/2023 1127   CO2 27 10/11/2014 1800   BUN 30 (H) 04/27/2023 1127   BUN 13 03/11/2021 1136   BUN 15 10/11/2014 1800   CREATININE 1.17 04/27/2023 1127   CREATININE 1.09 04/27/2023 1009   CREATININE 0.89 10/11/2014 1800      Component Value Date/Time   CALCIUM 8.2 (L) 04/27/2023 1127   CALCIUM 8.8 (L) 10/11/2014 1800   ALKPHOS 81 04/27/2023 1127   ALKPHOS 61 10/11/2014 1800   AST 19 04/27/2023 1127   AST 20 04/27/2023 1009   ALT 21 04/27/2023 1127   ALT 22 04/27/2023 1009   ALT 22 10/11/2014 1800   BILITOT 1.0 04/27/2023 1127   BILITOT 0.8 04/27/2023 1009      RADIOGRAPHIC STUDIES: I have personally reviewed the radiological images as listed and agreed with the findings in the report. No results found.   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  Patient currently on Acalabrutnib [AUG 16th, 2023]-  Patient admits to compliance; JUNE 13th, 2024- [compared to CT JAN 2024] -Increased adenopathy above and below the diaphragm compatible with progressive lymphomatous disease.  No splenomegaly. However, patient had interruption of his acalabrutinib just prior to his CT scan.  Clinically noted to have improvement of his neck lymph nodes. Continue Acalbrutinib 100 mg BID.   # He's had some interruptions in treatment due to interval illness and hospitalizations.   8/20- COVID positive. Treated with doxycycline and prednisone.   03/31/23-04/03/23- hospitalization for campylobacter gastroenteritis, hypotension. Was treated in hospital with azithromycin and supportive care.   04/21/23- Admitted for C3-T1  Laminoplasty with Dr. Katrinka Blazing, discharged on 10/5. He experienced acute blood loss anemia and required 1 unit pRBCs post operatively. Acalabrutinib was held 7 days prior to and after surgery.    04/27/23- AMS and hypotension. Transferred from [facility] to ER where he received IVF with improvement in mentation and BP. He received empiric antibiotics but was discharged home same day.   # Clinically enlarged lymph nodes of neck. Given medication interruptions, recommend holding of on imaging for now. Restart acal 100 mg BID. Reevaluate in 2 months. Consider imaging at that time.   # Mild Anemia Hb ~10-11- NOV 2023- iron sat- 17%; ferritin-71 continue gentle iron/slow iron. Hmg 9.0. Ferritin 72, iron sat 24%. Monitor for now. If worsening, recommend bone marrow.    # Low immunoglobulins IgG 330/recent severe respiratory infection/CLL-s/pI IVIG #4/4 [last April 2022].MARCH 2024-- IgG > 1500. Monitor.    # A.fib-on amiodarone [ Dr.Gollan] sinus rhythm-on Eliquis/CHF- lasix  40/d- s/p Cath [AUG, 2022- Echo-55%] -stable    # Left sided CVA- [s/p rehab; April 2023]- stable    # Vaccination: s/p Flu shot.    DISPOSITION: 2 mo- lab, Dr Donneta Romberg- [place]  No problem-specific Assessment & Plan notes found for this encounter.   No orders of the defined types were placed in this encounter.  All questions were answered. The patient knows to call the clinic with any problems, questions or concerns.    Alinda Dooms, NP 05/07/2023

## 2023-05-07 NOTE — Telephone Encounter (Signed)
Oral Oncology Patient Advocate Encounter  Was successful in securing patient a $8,000.00 grant from [facility] to provide copayment coverage for Calquence.  This will keep the out of pocket expense at $0.     [employer] ID: [id number]  I have spoken with the patient.   The billing information is as follows and has been shared with [facility].    RxBin: [id number] PCN: [id number] Member ID: [id number] Group ID: [id number] Dates of Eligibility: 04/07/23 through 04/05/24  Fund:  Chronic Lymphocytic Leukemia   Ardeen Fillers, CPhT Oncology Pharmacy Patient Advocate  [facility]  [phone] (phone) [phone] (fax) 05/07/2023 10:02 AM

## 2023-05-07 NOTE — Patient Instructions (Signed)
Let's restart your calquence and we'll plan to see you back in a couple of months to see how things are going. If you have infections or illnesses in the meantime, please notify the clinic. Glad you're doing better and great to see you today. Leotis Shames, NP

## 2023-05-08 ENCOUNTER — Other Ambulatory Visit: Payer: Self-pay

## 2023-05-08 ENCOUNTER — Encounter: Payer: Self-pay | Admitting: Internal Medicine

## 2023-05-08 LAB — SAMPLE TO BLOOD BANK

## 2023-05-08 LAB — VITAMIN B12: Vitamin B-12: 1056 pg/mL — ABNORMAL HIGH (ref 180–914)

## 2023-05-12 ENCOUNTER — Other Ambulatory Visit: Payer: Self-pay | Admitting: Family Medicine

## 2023-05-13 ENCOUNTER — Ambulatory Visit: Payer: Medicare HMO | Admitting: Internal Medicine

## 2023-05-13 ENCOUNTER — Encounter: Payer: Self-pay | Admitting: Internal Medicine

## 2023-05-13 VITALS — BP 104/60 | HR 61 | Temp 97.6°F | Ht 68.0 in | Wt 193.4 lb

## 2023-05-13 DIAGNOSIS — J449 Chronic obstructive pulmonary disease, unspecified: Secondary | ICD-10-CM

## 2023-05-13 DIAGNOSIS — G4733 Obstructive sleep apnea (adult) (pediatric): Secondary | ICD-10-CM

## 2023-05-13 NOTE — Progress Notes (Signed)
SYNOPSIS 77 year old male, former smoker. PMH significant for COPD, OSA. Former patient of Dr. Sung Amabile, last seen 01/18/2018. Maintained on CPAP for sleep apnea. Continue Symbicort 160 and Spiriva.     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 Probable mild obstruction (FEV1 70% pred) with gas trapping (RV 134% pred) and mild-mod decrease in DLCO PSG (07/25/15): Overall AHI < 10/hr but > 100/hr in supine position. ROV (07/30/15): Dyspnea on exertion is moderately improved with scheduled use of Symbicort. He is also on Spiriva, PRN albuterol with rare use of albuterol MDI. PSG results not available. Continue same COPD regimen CPAP titration (08/29/15): recommend full mask with CPAP 8 cm H2O CPAP compliance 11/26-12/25/18: Usage 30/30. Greater than 4 hrs: 27/30.     CC Follow up assessment of COPD Follow up assessment of OSA    HPI Regarding COPD No exacerbation at this time No evidence of heart failure at this time No evidence or signs of infection at this time No respiratory distress No fevers, chills, nausea, vomiting, diarrhea No evidence of lower extremity edema No evidence hemoptysis  Current Regimen He is Symbicort and Spiriva daily Intermittent albuterol use   Regarding OSA 5-6 out of sleep at night Fullface mask Current DL excellent compliance report greater than 90% for days and greater than 4 hours AHI significantly reduced down to 4.3 initial AHI was 44 CPAP 8 cm    Previous download showed excellent compliance sleep apnea well-controlled Benefits from CPAP therapy Excellent compliance with CPAP machine and therapy 100% compliance for days and greater than 4 hours CPAP 8 cm of water pressure AHI 4.8     Patient with  atrial fibrillation status post pacemaker Continue amiodarone therapy Continue oral anticoagulation therapy pulmonary function testing to assess for restrictive lung disease as needed   Counseling given: Not Answered   Outpatient Medications Prior to Visit  Medication Sig Dispense Refill   acetaminophen (TYLENOL) 325 MG tablet Take 1-2 tablets (325-650 mg total) by mouth every 4 (four) hours as needed for mild pain.     albuterol (VENTOLIN HFA) 108 (90 Base) MCG/ACT inhaler Inhale 2 puffs into the lungs every 6 (six) hours as needed for wheezing or shortness of breath. 1 each 0   amiodarone (PACERONE) 200 MG tablet Take 1/2 tablet (100 mg) by mouth twice daily 5 days a week (Patient taking differently: Take 100 mg by mouth See admin instructions. Take 1/2 tablet (100 mg) by mouth twice daily-takes 5 days a week. Mon, Tues, Thurs, Fri, Sat) 60 tablet 6   Apple Cider Vinegar 500 MG TABS Take 1 tablet by mouth daily.     azelastine (ASTELIN) 0.1 % nasal spray Place 2 sprays into both nostrils 2 (two) times daily. Use in each nostril as directed 30 mL 1   cetirizine (ZYRTEC) 10 MG tablet Take 10 mg by mouth daily as needed for allergies.      Cholecalciferol (VITAMIN D3) 250 MCG (10000 UT) capsule Take 10,000 Units by mouth daily.     Coenzyme Q10 (COQ10) 200 MG CAPS Take 200 mg by mouth daily.     cyanocobalamin (VITAMIN B12) 1000 MCG tablet Take 1,000 mcg by mouth daily.     Evolocumab (REPATHA SURECLICK) 140 MG/ML SOAJ Inject 140  mg into the skin every 14 (fourteen) days. 2 mL 11   ezetimibe (ZETIA) 10 MG tablet Take 1 tablet (10 mg total) by mouth daily. (Patient taking differently: Take 10 mg by mouth every morning.) 90 tablet 3   ferrous sulfate 325 (65 FE) MG EC tablet Take 325 mg by mouth daily.     fluticasone-salmeterol (WIXELA INHUB) 100-50 MCG/ACT AEPB Inhale 2 puffs into the lungs 2 (two) times daily.     mirtazapine (REMERON) 15 MG tablet Take 15 mg by mouth at bedtime as needed  (for panic associated with PTSD).     mometasone-formoterol (DULERA) 200-5 MCG/ACT AERO Inhale 2 puffs into the lungs 2 (two) times daily.     Multiple Vitamin (MULTIVITAMIN WITH MINERALS) TABS tablet Take 1 tablet by mouth daily.     pantoprazole (PROTONIX) 40 MG tablet TAKE 1 TABLET(40 MG) BY MOUTH DAILY 90 tablet 1   senna (SENOKOT) 8.6 MG TABS tablet Take 1 tablet (8.6 mg total) by mouth daily as needed for mild constipation. 30 tablet 0   tamsulosin (FLOMAX) 0.4 MG CAPS capsule TAKE 1 CAPSULE(0.4 MG) BY MOUTH DAILY 30 capsule 3   Tiotropium Bromide Monohydrate 2.5 MCG/ACT AERS Inhale 2 puffs into the lungs at bedtime. Spiriva     tiZANidine (ZANAFLEX) 4 MG capsule Take 1 capsule (4 mg total) by mouth 3 (three) times daily. This can make you sleepy. 60 capsule 0   Facility-Administered Medications Prior to Visit  Medication Dose Route Frequency Provider Last Rate Last Admin   potassium chloride 10 mEq in 100 mL IVPB  10 mEq Intravenous Once Borders, Ivin Booty R, NP         BP 104/60 (BP Location: Left Arm, Patient Position: Sitting, Cuff Size: Normal)   Pulse 61   Temp 97.6 F (36.4 C)   Ht 5\' 8"  (1.727 m)   Wt 193 lb 6.4 oz (87.7 kg)   SpO2 98%   BMI 29.41 kg/m     Review of Systems: Gen:  Denies  fever, sweats, chills weight loss  HEENT: Denies blurred vision, double vision, ear pain, eye pain, hearing loss, nose bleeds, sore throat Cardiac:  No dizziness, chest pain or heaviness, chest tightness,edema, No JVD Resp:   No cough, -sputum production, -shortness of breath,-wheezing, -hemoptysis,  Other:  All other systems negative   Physical Examination:   General Appearance: No distress  EYES PERRLA, EOM intact.   NECK Supple, No JVD Pulmonary: normal breath sounds, No wheezing.  CardiovascularNormal S1,S2.  No m/r/g.   Abdomen: Benign, Soft, non-tender. Neurology UE/LE 5/5 strength, no focal deficits Ext pulses intact, cap refill intact ALL OTHER ROS ARE NEGATIVE       Lab Results:  CBC    Component Value Date/Time   WBC 8.0 05/07/2023 0908   WBC 7.5 04/27/2023 1127   RBC 2.80 (L) 05/07/2023 0908   RBC 2.78 (L) 05/07/2023 0908   HGB 9.0 (L) 05/07/2023 0908   HGB 12.5 (L) 03/11/2021 1136   HCT 28.2 (L) 05/07/2023 0908   HCT 36.9 (L) 03/11/2021 1136   PLT 147 (L) 05/07/2023 0908   PLT 159 03/11/2021 1136   MCV 101.4 (H) 05/07/2023 0908   MCV 100 (H) 03/11/2021 1136   MCV 95 10/24/2014 0837   MCH 32.4 05/07/2023 0908   MCHC 31.9 05/07/2023 0908   RDW 17.2 (H) 05/07/2023 0908   RDW 13.5 03/11/2021 1136   RDW 14.6 (H) 10/24/2014 0837   LYMPHSABS 2.8 05/07/2023 0908   LYMPHSABS  0.8 (L) 10/24/2014 0837   MONOABS 1.6 (H) 05/07/2023 0908   MONOABS 0.6 10/24/2014 0837   EOSABS 0.4 05/07/2023 0908   EOSABS 0.2 10/24/2014 0837   BASOSABS 0.0 05/07/2023 0908   BASOSABS 0.0 10/24/2014 0837    BMET    Component Value Date/Time   NA 136 05/07/2023 0908   NA 143 03/11/2021 1136   NA 139 10/11/2014 1800   K 3.9 05/07/2023 0908   K 3.3 (L) 10/11/2014 1800   CL 109 05/07/2023 0908   CL 106 10/11/2014 1800   CO2 22 05/07/2023 0908   CO2 27 10/11/2014 1800   GLUCOSE 109 (H) 05/07/2023 0908   GLUCOSE 107 (H) 10/11/2014 1800   BUN 13 05/07/2023 0908   BUN 13 03/11/2021 1136   BUN 15 10/11/2014 1800   CREATININE 1.11 05/07/2023 0908   CREATININE 0.89 10/11/2014 1800   CALCIUM 8.1 (L) 05/07/2023 0908   CALCIUM 8.8 (L) 10/11/2014 1800   GFRNONAA >60 05/07/2023 0908   GFRNONAA >60 10/11/2014 1800   GFRAA >60 04/12/2020 0959   GFRAA >60 10/11/2014 1800      ASSESSMENT AND PLAN 77 year old male, former smoker.  Follow-up COPD and follow-up OSA with AFIB on AMIO therapy  COPD Stable No exacerbations Continue inhalers as prescribed  Patient Instructions  Continue to use CPAP every night, minimum of 4-6 hours a night.  Change equipment every 30 days or as directed by DME.  Wash your tubing with warm soap and water daily, hang to dry.  Wash humidifier portion weekly. Use bottled, distilled water and change daily   Be aware of reduced alertness and do not drive or operate heavy machinery if experiencing this or drowsiness.  Exercise encouraged, as tolerated. Encouraged proper weight management.  Important to get eight or more hours of sleep  Limiting the use of the computer and television before bedtime.  Decrease naps during the day, so night time sleep will become enhanced.  Limit caffeine, and sleep deprivation.  HTN, stroke, uncontrolled diabetes and heart failure are potential risk factors.  Risk of untreated sleep apnea including cardiac arrhthymias, stroke, DM, pulm HTN.  Patient has excellent compliance report with CPAP of 8 cm of water pressure Patient uses and benefits from CPAP therapy OSA is well controlled and reduced Patient uses nasal pillows   A. fib RVR with amiodarone therapy Recommend obtaining pulmonary function testing to assess for restrictive lung disease Follow-up cardiology as scheduled    MEDICATION ADJUSTMENTS/LABS AND TESTS ORDERED: Continue Inhalers as prescribed Continue CPAP as prescribed Follow-up cardiology as scheduled PFT's as needed  CURRENT MEDICATIONS REVIEWED AT LENGTH WITH PATIENT TODAY   Patient satisfied with Plan of action and management. All questions answered  Follow-up in 6 months  Total time spent 33 minutes  Lucie Leather, M.D.  [facility]  Medical Director [facility] Medical Director [facility] Cardio-Pulmonary Department

## 2023-05-13 NOTE — Patient Instructions (Addendum)
Excellent Job A+  Continue CPAP as prescribed  Avoid secondhand smoke Avoid SICK contacts Recommend  Masking  when appropriate Recommend Keep up-to-date with vaccinations

## 2023-05-25 NOTE — Progress Notes (Signed)
Remote pacemaker transmission.   

## 2023-05-27 ENCOUNTER — Encounter: Payer: Self-pay | Admitting: Internal Medicine

## 2023-05-27 ENCOUNTER — Other Ambulatory Visit: Payer: Self-pay

## 2023-06-02 ENCOUNTER — Other Ambulatory Visit: Payer: Self-pay

## 2023-06-03 ENCOUNTER — Ambulatory Visit (INDEPENDENT_AMBULATORY_CARE_PROVIDER_SITE_OTHER): Payer: Medicare HMO | Admitting: Neurosurgery

## 2023-06-03 ENCOUNTER — Encounter: Payer: Self-pay | Admitting: Neurosurgery

## 2023-06-03 VITALS — BP 170/96 | Temp 98.1°F | Ht 68.0 in | Wt 193.0 lb

## 2023-06-03 DIAGNOSIS — Z09 Encounter for follow-up examination after completed treatment for conditions other than malignant neoplasm: Secondary | ICD-10-CM

## 2023-06-03 DIAGNOSIS — M4712 Other spondylosis with myelopathy, cervical region: Secondary | ICD-10-CM

## 2023-06-03 DIAGNOSIS — G959 Disease of spinal cord, unspecified: Secondary | ICD-10-CM

## 2023-06-03 DIAGNOSIS — M542 Cervicalgia: Secondary | ICD-10-CM

## 2023-06-05 ENCOUNTER — Other Ambulatory Visit: Payer: Self-pay

## 2023-06-08 ENCOUNTER — Other Ambulatory Visit: Payer: Self-pay | Admitting: Internal Medicine

## 2023-06-08 ENCOUNTER — Other Ambulatory Visit (HOSPITAL_COMMUNITY): Payer: Self-pay

## 2023-06-08 ENCOUNTER — Other Ambulatory Visit: Payer: Self-pay

## 2023-06-08 DIAGNOSIS — C9110 Chronic lymphocytic leukemia of B-cell type not having achieved remission: Secondary | ICD-10-CM

## 2023-06-08 MED ORDER — CALQUENCE 100 MG PO TABS
100.0000 mg | ORAL_TABLET | Freq: Two times a day (BID) | ORAL | 2 refills | Status: DC
  Filled 2023-06-08: qty 60, 30d supply, fill #0
  Filled 2023-07-02: qty 60, 30d supply, fill #1
  Filled 2023-08-05: qty 60, 30d supply, fill #2

## 2023-06-08 NOTE — Progress Notes (Signed)
Specialty Pharmacy Refill Coordination Note  [name] is a 77 y.o. male contacted today regarding refills of specialty medication(s) Acalabrutinib Maleate   Patient requested Delivery   Delivery date: 06/15/23   Verified address: [address]  [place] [address]   Medication will be filled on 06/12/23.   Using [facility] this fill.

## 2023-06-08 NOTE — Telephone Encounter (Signed)
Component Ref Range & Units 1 mo ago (05/07/23) 1 mo ago (04/27/23) 1 mo ago (04/27/23) 1 mo ago (04/24/23) 1 mo ago (04/23/23) 1 mo ago (04/23/23) 1 mo ago (04/22/23)  WBC Count 4.0 - 10.5 K/uL 8.0 7.5 6.6 8.0  5.5 9.9  RBC 4.22 - 5.81 MIL/uL 2.78 Low  2.47 Low  2.54 Low  2.76 Low   2.05 Low  2.39 Low   Hemoglobin 13.0 - 17.0 g/dL 9.0 Low  7.9 Low  8.1 Low  8.8 Low  7.8 Low  6.7 Low  7.8 Low   HCT 39.0 - 52.0 % 28.2 Low  24.2 Low  25.8 Low  27.4 Low  23.6 Low  CM 20.7 Low  24.7 Low   MCV 80.0 - 100.0 fL 101.4 High  98.0 101.6 High  99.3  101.0 High  103.3 High   MCH 26.0 - 34.0 pg 32.4 32.0 31.9 31.9  32.7 32.6  MCHC 30.0 - 36.0 g/dL 84.1 32.4 40.1 02.7  25.3 31.6  RDW 11.5 - 15.5 % 17.2 High  17.1 High  17.1 High  18.3 High   16.0 High  15.7 High   Platelet Count 150 - 400 K/uL 147 Low  127 Low  123 Low  CM 113 Low   98 Low  CM 126 Low   nRBC 0.0 - 0.2 % 0.0 0.0 0.0 0.0 CM  0.0 CM 0.0 CM  Neutrophils Relative % % 37 53 57      Neutro Abs 1.7 - 7.7 K/uL 3.0 4.0 3.8      Lymphocytes Relative % 36 32 29      Lymphs Abs 0.7 - 4.0 K/uL 2.8 2.4 1.9      Monocytes Relative % 21 9 8       Monocytes Absolute 0.1 - 1.0 K/uL 1.6 High  0.7 0.6      Eosinophils Relative % 4 3 3       Eosinophils Absolute 0.0 - 0.5 K/uL 0.4 0.2 0.2      Basophils Relative % 1 1 1       Basophils Absolute 0.0 - 0.1 K/uL 0.0 0.1 0.0      Immature Granulocytes % 1 2 2       Abs Immature Granulocytes 0.00 - 0.07 K/uL 0.06 0.11 High  CM 0.11 High  CM       Component Ref Range & Units 1 mo ago (05/07/23) 1 mo ago (04/27/23) 1 mo ago (04/27/23) 1 mo ago (04/23/23) 1 mo ago (04/22/23) 1 mo ago (04/10/23) 2 mo ago (04/03/23)  Sodium 135 - 145 mmol/L 136 137 134 Low  136 138 142 R 139  Potassium 3.5 - 5.1 mmol/L 3.9 4.2 4.1 4.0 4.6 3.6 R 3.7  Chloride 98 - 111 mmol/L 109 102 104 108 111 105 R 110  CO2 22 - 32 mmol/L 22 25 24 21  Low  22 27 R 24  Glucose, Bld 70 - 99 mg/dL 664 High  403 High   CM 142 High  CM 108 High  CM 154 High  CM 102 High  97 CM  Comment: Glucose reference range applies only to samples taken after fasting for at least 8 hours.  BUN 8 - 23 mg/dL 13 30 High  28 High  28 High  29 High  9 R 5 Low   Creatinine 0.61 - 1.24 mg/dL 4.74 2.59 5.63 8.75 6.43 High  0.95 R 0.81  Calcium 8.9 - 10.3 mg/dL 8.1 Low  8.2 Low  7.9  Low  7.4 Low  7.6 Low  8.6 R 8.2 Low   Total Protein 6.5 - 8.1 g/dL 6.3 Low  6.1 Low  6.0 Low       Albumin 3.5 - 5.0 g/dL 3.4 Low  3.2 Low  3.1 Low       AST 15 - 41 U/L 19 19 20       ALT 0 - 44 U/L 13 21 22       Alkaline Phosphatase 38 - 126 U/L 78 81 90      Total Bilirubin 0.3 - 1.2 mg/dL 1.0 1.0 0.8      GFR, Estimated >60 mL/min >60  >60 CM      Comment: (NOTE) Calculated using the CKD-EPI Creatinine Equation (2021)  Anion gap 5 - 15 5 10  CM 6 CM 7 CM 5 CM  5 CM

## 2023-06-08 NOTE — Progress Notes (Signed)
   REFERRING PHYSICIAN:  Nicholaus Corolla [address] [place],  [place] [address]  DOS: 04/21/23 C3-T1 laminoplasty   HISTORY OF PRESENT ILLNESS: [name] is about 6 weeks status post laminoplasty for cervical stenosis and myelopathy. Overall, he is doing well post-operatively despite having to return to the hospital for altered mental status and hypotension on 04/27/2023.  He was given IV fluids and ultimately discharged home.Today he reports significant improvement to his balance and pain.  Overall he feels like his functional status has been continuing to improve.  PHYSICAL EXAMINATION:  NEUROLOGICAL:  General: In no acute distress.   Awake, alert, oriented to person, place, and time.  Pupils equal round and reactive to light.  Facial tone is symmetric.  Tongue protrusion is midline.  There is no pronator drift.  Strength: Side Biceps Triceps Deltoid Interossei Grip Wrist Ext. Wrist Flex.  R 5 5 5 5 5 5 5   L 5 5 5 5 5 5 5    Incision c/d/I and healing well   Imaging:  No interval imaging to review today  Assessment / Plan: INOCENCIO MAVES is doing well after his recent laminoplasty.  He has had considerable improvement in his ability to walk as well as his upper extremity dexterity.  He can improve his weight restriction to 25 pounds and as tolerated after 12 weeks.  Overall he is doing very well.  We look forward to seeing him at his 38-month follow-up.   Advised to contact the office if any questions or concerns arise.   Lovenia Kim, MD Dept of Neurosurgery

## 2023-06-09 ENCOUNTER — Other Ambulatory Visit: Payer: Self-pay

## 2023-06-11 ENCOUNTER — Encounter: Payer: Medicare HMO | Admitting: Neurosurgery

## 2023-06-12 ENCOUNTER — Other Ambulatory Visit: Payer: Self-pay

## 2023-06-16 ENCOUNTER — Encounter: Payer: Self-pay | Admitting: Internal Medicine

## 2023-07-02 ENCOUNTER — Other Ambulatory Visit (HOSPITAL_COMMUNITY): Payer: Self-pay

## 2023-07-02 ENCOUNTER — Other Ambulatory Visit (HOSPITAL_COMMUNITY): Payer: Self-pay | Admitting: Pharmacy Technician

## 2023-07-02 NOTE — Progress Notes (Signed)
Specialty Pharmacy Refill Coordination Note  [name] is a 77 y.o. male contacted today regarding refills of specialty medication(s) Acalabrutinib Maleate ([place]quence)   Patient requested Delivery   Delivery date: 07/17/23   Verified address: [address]  [place]   Medication will be filled on 07/16/23.

## 2023-07-03 ENCOUNTER — Other Ambulatory Visit: Payer: Self-pay

## 2023-07-06 ENCOUNTER — Other Ambulatory Visit: Payer: Self-pay

## 2023-07-06 DIAGNOSIS — C9110 Chronic lymphocytic leukemia of B-cell type not having achieved remission: Secondary | ICD-10-CM

## 2023-07-07 ENCOUNTER — Inpatient Hospital Stay: Payer: Medicare HMO | Attending: Internal Medicine

## 2023-07-07 ENCOUNTER — Encounter: Payer: Self-pay | Admitting: Internal Medicine

## 2023-07-07 ENCOUNTER — Inpatient Hospital Stay: Payer: Medicare HMO | Admitting: Internal Medicine

## 2023-07-07 VITALS — BP 131/71 | HR 71 | Temp 96.7°F | Ht 68.0 in | Wt 196.4 lb

## 2023-07-07 DIAGNOSIS — Z7901 Long term (current) use of anticoagulants: Secondary | ICD-10-CM | POA: Diagnosis not present

## 2023-07-07 DIAGNOSIS — Z87891 Personal history of nicotine dependence: Secondary | ICD-10-CM | POA: Insufficient documentation

## 2023-07-07 DIAGNOSIS — D649 Anemia, unspecified: Secondary | ICD-10-CM | POA: Diagnosis not present

## 2023-07-07 DIAGNOSIS — C9112 Chronic lymphocytic leukemia of B-cell type in relapse: Secondary | ICD-10-CM | POA: Diagnosis present

## 2023-07-07 DIAGNOSIS — G8929 Other chronic pain: Secondary | ICD-10-CM | POA: Diagnosis not present

## 2023-07-07 DIAGNOSIS — C9110 Chronic lymphocytic leukemia of B-cell type not having achieved remission: Secondary | ICD-10-CM | POA: Diagnosis not present

## 2023-07-07 DIAGNOSIS — J449 Chronic obstructive pulmonary disease, unspecified: Secondary | ICD-10-CM | POA: Insufficient documentation

## 2023-07-07 DIAGNOSIS — Z79899 Other long term (current) drug therapy: Secondary | ICD-10-CM | POA: Insufficient documentation

## 2023-07-07 DIAGNOSIS — Z8673 Personal history of transient ischemic attack (TIA), and cerebral infarction without residual deficits: Secondary | ICD-10-CM | POA: Diagnosis not present

## 2023-07-07 DIAGNOSIS — M2550 Pain in unspecified joint: Secondary | ICD-10-CM | POA: Diagnosis not present

## 2023-07-07 DIAGNOSIS — I4891 Unspecified atrial fibrillation: Secondary | ICD-10-CM | POA: Diagnosis not present

## 2023-07-07 LAB — CMP (CANCER CENTER ONLY)
ALT: 16 U/L (ref 0–44)
AST: 17 U/L (ref 15–41)
Albumin: 4 g/dL (ref 3.5–5.0)
Alkaline Phosphatase: 63 U/L (ref 38–126)
Anion gap: 8 (ref 5–15)
BUN: 20 mg/dL (ref 8–23)
CO2: 24 mmol/L (ref 22–32)
Calcium: 8.7 mg/dL — ABNORMAL LOW (ref 8.9–10.3)
Chloride: 109 mmol/L (ref 98–111)
Creatinine: 1 mg/dL (ref 0.61–1.24)
GFR, Estimated: >60 mL/min (ref >60)
Glucose, Bld: 126 mg/dL — ABNORMAL HIGH (ref 70–99)
Potassium: 3.7 mmol/L (ref 3.5–5.1)
Sodium: 141 mmol/L (ref 135–145)
Total Bilirubin: 0.5 mg/dL (ref <1.2)
Total Protein: 6.6 g/dL (ref 6.5–8.1)

## 2023-07-07 LAB — CBC WITH DIFFERENTIAL (CANCER CENTER ONLY)
Abs Immature Granulocytes: 0.04 10*3/uL (ref 0.00–0.07)
Basophils Absolute: 0 10*3/uL (ref 0.0–0.1)
Basophils Relative: 0 %
Eosinophils Absolute: 0.1 10*3/uL (ref 0.0–0.5)
Eosinophils Relative: 1 %
HCT: 32.7 % — ABNORMAL LOW (ref 39.0–52.0)
Hemoglobin: 10.6 g/dL — ABNORMAL LOW (ref 13.0–17.0)
Immature Granulocytes: 0 %
Lymphocytes Relative: 67 %
Lymphs Abs: 7.4 10*3/uL — ABNORMAL HIGH (ref 0.7–4.0)
MCH: 33.3 pg (ref 26.0–34.0)
MCHC: 32.4 g/dL (ref 30.0–36.0)
MCV: 102.8 fL — ABNORMAL HIGH (ref 80.0–100.0)
Monocytes Absolute: 0.5 10*3/uL (ref 0.1–1.0)
Monocytes Relative: 4 %
Neutro Abs: 3.2 10*3/uL (ref 1.7–7.7)
Neutrophils Relative %: 28 %
Platelet Count: 149 10*3/uL — ABNORMAL LOW (ref 150–400)
RBC: 3.18 MIL/uL — ABNORMAL LOW (ref 4.22–5.81)
RDW: 14.9 % (ref 11.5–15.5)
Smear Review: NORMAL
WBC Count: 11.2 10*3/uL — ABNORMAL HIGH (ref 4.0–10.5)
nRBC: 0 % (ref 0.0–0.2)

## 2023-07-07 LAB — LACTATE DEHYDROGENASE: LDH: 122 U/L (ref 98–192)

## 2023-07-07 LAB — IRON AND TIBC
Iron: 94 ug/dL (ref 45–182)
Saturation Ratios: 26 % (ref 17.9–39.5)
TIBC: 357 ug/dL (ref 250–450)
UIBC: 263 ug/dL

## 2023-07-07 LAB — RETICULOCYTES
Immature Retic Fract: 17.8 % — ABNORMAL HIGH (ref 2.3–15.9)
RBC.: 3.16 MIL/uL — ABNORMAL LOW (ref 4.22–5.81)
Retic Count, Absolute: 79 10*3/uL (ref 19.0–186.0)
Retic Ct Pct: 2.5 % (ref 0.4–3.1)

## 2023-07-07 LAB — VITAMIN B12: Vitamin B-12: 1482 pg/mL — ABNORMAL HIGH (ref 180–914)

## 2023-07-07 LAB — FERRITIN: Ferritin: 42 ng/mL (ref 24–336)

## 2023-07-07 NOTE — Progress Notes (Signed)
[name] OFFICE PROGRESS NOTE  Patient Care Team: Michael Luis, MD as PCP - General (Family Medicine) Michael Iba, MD as PCP - Cardiology (Cardiology) Michael Salvia, MD as PCP - Electrophysiology (Cardiology) Michael Iba, MD as Consulting Physician (Cardiology) Michael Coder, MD as Medical Oncologist (Hematology and Oncology) Michael Butte, RN as Registered Nurse (Oncology)   Cancer Staging  No matching staging information was found for the patient.    Oncology History Overview Note  # AUG 2015- SLL/CLL [Right Ax Ln Bx] s/p Benda-Rituxan x6 [finished March 2016]; Maintenance Rituxan q 47m [started April 2016; Dr.Pandit];Last Ritux Jan 2017.  MARCH 2017- CT N/C/A/P- NED. STOP Ritux; surveillance   # AUG 2019- CT/PET- progression/NO transformation;  # NOV 2019- Progression; started Ibrutinib 420 mg/d. STOPPED in end of feb sec to extreme fatigue/joint pains/cramps  #November 01, 2018-start ibrutinib 280 mg a day; December 20, 2018-discontinue ibrutinib secondary multiple side effects.   #January 03, 2019 start Gazyva + Ven [July]; finished Dec 2020-; started Peter Minium maintenance [200 mg a day; DI-amiodarone]; SEP 2022- congestive heart failure; s/p cardiac cath-noted to have CAD however no stents placed.  2D echo- EF-55%  #Mid March 2021-venetoclax dose reduced to 100 mg [second diarrhea]. HELD in DEC 2022/JAN 2023 x3 months-multiple admissions to the hospital/pneumonia infection stroke x 3 m.   # MAY 2023-progression of disease in the neck ches; Nov 18, 2021-RE-started venetoclax;   # JUNE 29th, 2023-progression of disease in the neck- STOP VENAOCLAX;   # AUG 16th, 2023 START ACALABRUTINIB 100 mg BID [previous intolerance to ibrutinib; delayed sec to C.diff]    #? 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 Ambulating with cane.    [name] 77 y.o.  male CLL-high risk of recurrent/relapsed currently on  alcabrutinib; afib on eliquis, COPD-  is here for a follow up.   In the interim patient underwent neck surgery.  He is able to walk better.  Patient had a recent fall- mechanical.  Hit head; no loss of consciousness.   No night sweats.  Appetite is normal. Energy is up and down. Complains of chronic joint pain.  Otherwise no fever no chills.  No nausea no vomiting.  No headache.  Review of Systems  Constitutional:  Positive for malaise/fatigue. Negative for chills, diaphoresis and fever.  HENT:  Negative for nosebleeds and sore throat.   Eyes:  Negative for double vision.  Respiratory:  Negative for hemoptysis, shortness of breath and wheezing.   Cardiovascular:  Negative for chest pain, palpitations, orthopnea and leg swelling.  Gastrointestinal:  Negative for abdominal pain, blood in stool, constipation, heartburn, melena, nausea and vomiting.  Genitourinary:  Negative for dysuria, frequency and urgency.  Musculoskeletal:  Positive for back pain, joint pain and myalgias.  Skin: Negative.  Negative for itching and rash.  Neurological:  Negative for dizziness, tingling, focal  weakness and headaches.  Psychiatric/Behavioral:  Negative for depression. The patient is not nervous/anxious and does not have insomnia.    PAST MEDICAL HISTORY :  Past Medical History:  Diagnosis Date   Agent orange exposure     Anxiety    Arthritis    Atrial fibrillation (HCC) 2013   a.) Dx'd in 2013; recurrence 02/2014; b.) CHA2DS2-VASc = 7 (age x2, HFimpEF, HTN, CVA x 2, previous MI); c.) cardiac rate/rhythm maintained on oral amiodarone; chronically anticoagulated using apixaban   BPH (benign prostatic hyperplasia)    C. 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)    a.) MV 08/23/2015: EF 50%; b.) TTE 08/23/2015: EF 55-60%, G1DD; c.) TTE 11/02/2015: EF 55-60%, mild LA dil; d.) TTE 02/04/2020: EF 55-60%, mild LVH, G1DD, PASP 44.3, mild-mod AoV sclerosis; e.) MV 12/25/2020: EF 45-54%; f.)  R/LHC 02/04/2021: mRA 13, mPA 27, mPCWP 22, LVEDP 23, Ao sat 99, PA sat 70, CO 6.5, CI 3.0; g.) TTE 02/19/2021: EF 55-60%, G1DD, mildly red RVSF, triv MR, mild-mod AoV scler   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 EF 55%, mild antlat HK;  c.) s/p R/LHC 02/04/2021: LM mild diff dzs, LAD 100p, LCX nl, RCA 50p, 95d, 99 side branch, RPAV fills via LCx. SVG->RPDA 100, SVG->OM1 100, LIMA->LAD patent. EF 35-45%.   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    Hyperlipidemia LDL goal <70    Hypertension     Hypokalemia    Ischemic cardiomyopathy    a.) MV 08/23/2015: EF 50%; b.) TTE 08/23/2015: EF 55-60%; c.) TTE 11/02/2015: EF 55-60%; d.) TTE 02/04/2020: EF 55-60%; e.) MV 12/25/2020: EF 45-54%; f.) R/LHC 02/04/2021: mRA 13, mPA 27, mPCWP 22, LVEDP 23, Ao sat 99, PA sat 70, CO 6.5, CI 3.0; g.) TTE 02/19/2021: EF 55-60%   Ischemic cerebrovascular accident (CVA) (HCC) 10/18/2021   Myocardial infarction (HCC) 2010   On amiodarone therapy    On apixaban therapy    OSA on CPAP    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   TIA (transient ischemic attack) 11/02/2015   PAST SURGICAL HISTORY :   Past Surgical History:  Procedure Laterality Date   ABDOMINAL HERNIA REPAIR     APPENDECTOMY  06/21/1985   CORONARY ARTERY BYPASS GRAFT N/A 05/09/2009   EP IMPLANTABLE DEVICE N/A 03/03/2016   Procedure: Pacemaker Implant;  Surgeon: Michael Salvia, MD;  Location: [facility] INVASIVE CV LAB;  Service: Cardiovascular;  Laterality: N/A;   FOREIGN BODY REMOVAL  1968   "shrapnel in my tailbone"   INGUINAL HERNIA REPAIR Right    JOINT REPLACEMENT Right 2018   LAPAROSCOPIC CHOLECYSTECTOMY     LEFT HEART CATH AND CORONARY ANGIOGRAPHY Left 05/08/2009   Procedure: LEFT HEART CATH AND CORONARY ANGIOGRAPHY; Location: [facility]: Surgeon: Lorine Bears, MD   LEFT HEART CATH AND CORS/GRAFTS ANGIOGRAPHY Left 10/17/2009   Procedure: LEFT HEART CATH AND CORS/GRAFTS  ANGIOGRAPHY; Location: [facility]; Surgeon: Lorine Bears, MD   RIGHT/LEFT HEART CATH AND CORONARY/GRAFT ANGIOGRAPHY N/A 02/04/2021   Procedure: RIGHT/LEFT HEART CATH AND CORONARY/GRAFT ANGIOGRAPHY;  Surgeon: Yvonne Kendall, MD;  Location: [facility] INVASIVE CV LAB;  Service: Cardiovascular;  Laterality: N/A;   TONSILLECTOMY AND ADENOIDECTOMY  1956   TOTAL HIP ARTHROPLASTY Right 10/22/2016   Procedure: TOTAL HIP ARTHROPLASTY;  Surgeon: Donato Heinz, MD;  Location: [facility] ORS;  Service: Orthopedics;  Laterality: Right;   TOTAL HIP ARTHROPLASTY Left 11/04/2017   Procedure: TOTAL HIP ARTHROPLASTY;  Surgeon: Donato Heinz, MD;  Location: [facility] ORS;  Service: Orthopedics;  Laterality: Left;    FAMILY HISTORY :   Family History  Problem Relation Age of Onset   Heart disease Mother    Heart attack Mother    Coronary artery disease Other        family history    SOCIAL HISTORY:   Social History   Tobacco Use   Smoking status: Former    Current packs/day: 0.00    Average packs/day: 1 pack/day for 40.0 years (40.0 ttl pk-yrs)    Types: Cigarettes    Start date: 07/21/1966    Quit date: 07/21/2006    Years since quitting: 16.9   Smokeless tobacco: Never  Vaping Use   Vaping status: Former  Substance Use Topics   Alcohol use: Not Currently   Drug use: No    ALLERGIES:  is allergic to atorvastatin and augmentin [amoxicillin-pot clavulanate].  MEDICATIONS:  Current Outpatient Medications  Medication Sig Dispense Refill   acalabrutinib maleate (CALQUENCE) 100 MG tablet Take 1 tablet (100 mg total) by mouth 2 (two) times daily. 60 tablet 2   acetaminophen (TYLENOL) 325 MG tablet Take 1-2 tablets (325-650 mg total) by mouth every 4 (four) hours as needed for mild pain.     albuterol (VENTOLIN HFA) 108 (90 Base) MCG/ACT inhaler Inhale 2 puffs into the lungs every 6 (six) hours as needed for wheezing or shortness of breath. 1 each 0   amiodarone (PACERONE) 200 MG tablet Take 1/2 tablet (100 mg) by  mouth twice daily 5 days a week (Patient taking differently: Take 100 mg by mouth See admin instructions. Take 1/2 tablet (100 mg) by mouth twice daily-takes 5 days a week. Mon, Tues, Thurs, Fri, Sat) 60 tablet 6   Apple Cider Vinegar 500 MG TABS Take 1 tablet by mouth daily.     azelastine (ASTELIN) 0.1 % nasal spray Place 2 sprays into both nostrils 2 (two) times daily. Use in each nostril as directed 30 mL 1   cetirizine (ZYRTEC) 10 MG tablet  Take 10 mg by mouth daily as needed for allergies.      Cholecalciferol (VITAMIN D3) 250 MCG (10000 UT) capsule Take 10,000 Units by mouth daily.     Coenzyme Q10 (COQ10) 200 MG CAPS Take 200 mg by mouth daily.     cyanocobalamin (VITAMIN B12) 1000 MCG tablet Take 1,000 mcg by mouth daily.     Evolocumab (REPATHA SURECLICK) 140 MG/ML SOAJ Inject 140 mg into the skin every 14 (fourteen) days. 2 mL 11   ezetimibe (ZETIA) 10 MG tablet Take 1 tablet (10 mg total) by mouth daily. (Patient taking differently: Take 10 mg by mouth every morning.) 90 tablet 3   ferrous sulfate 325 (65 FE) MG EC tablet Take 325 mg by mouth daily.     fluticasone-salmeterol (WIXELA INHUB) 100-50 MCG/ACT AEPB Inhale 2 puffs into the lungs 2 (two) times daily.     mirtazapine (REMERON) 15 MG tablet Take 15 mg by mouth at bedtime as needed (for panic associated with PTSD).     mometasone-formoterol (DULERA) 200-5 MCG/ACT AERO Inhale 2 puffs into the lungs 2 (two) times daily.     Multiple Vitamin (MULTIVITAMIN WITH MINERALS) TABS tablet Take 1 tablet by mouth daily.     pantoprazole (PROTONIX) 40 MG tablet TAKE 1 TABLET(40 MG) BY MOUTH DAILY 90 tablet 1   senna (SENOKOT) 8.6 MG TABS tablet Take 1 tablet (8.6 mg total) by mouth daily as needed for mild constipation. 30 tablet 0   tamsulosin (FLOMAX) 0.4 MG CAPS capsule TAKE 1 CAPSULE(0.4 MG) BY MOUTH DAILY 30 capsule 3   Tiotropium Bromide Monohydrate 2.5 MCG/ACT AERS Inhale 2 puffs into the lungs at bedtime. Spiriva     tiZANidine  (ZANAFLEX) 4 MG capsule Take 1 capsule (4 mg total) by mouth 3 (three) times daily. This can make you sleepy. 60 capsule 0   traMADol (ULTRAM) 50 MG tablet Take 50 mg by mouth 2 (two) times daily.     No current facility-administered medications for this visit.   Facility-Administered Medications Ordered in Other Visits  Medication Dose Route Frequency Provider Last Rate Last Admin   potassium chloride 10 mEq in 100 mL IVPB  10 mEq Intravenous Once Borders, Daryl Eastern, NP        PHYSICAL EXAMINATION: ECOG PERFORMANCE STATUS: 1 - Symptomatic but completely ambulatory  BP 131/71 (BP Location: Left Arm, Patient Position: Sitting, Cuff Size: Normal)   Pulse 71   Temp (!) 96.7 F (35.9 C) (Tympanic)   Ht 5\' 8"  (1.727 m)   Wt 196 lb 6.4 oz (89.1 kg)   SpO2 100%   BMI 29.86 kg/m   Filed Weights   07/07/23 1003  Weight: 196 lb 6.4 oz (89.1 kg)    Physical Exam HENT:     Head: Normocephalic and atraumatic.     Mouth/Throat:     Pharynx: No oropharyngeal exudate.  Eyes:     Pupils: Pupils are equal, round, and reactive to light.  Cardiovascular:     Rate and Rhythm: Normal rate and regular rhythm.  Pulmonary:     Effort: No respiratory distress.     Breath sounds: No wheezing.  Abdominal:     General: Bowel sounds are normal. There is no distension.     Palpations: Abdomen is soft. There is no mass.     Tenderness: There is no abdominal tenderness. There is no guarding or rebound.  Musculoskeletal:        General: No tenderness. Normal range of motion.  Cervical back: Normal range of motion and neck supple.  Skin:    General: Skin is warm.  Neurological:     Mental Status: He is alert and oriented to person, place, and time.  Psychiatric:        Mood and Affect: Affect normal.     LABORATORY DATA:  I have reviewed the data as listed    Component Value Date/Time   NA 141 07/07/2023 0957   NA 143 03/11/2021 1136   NA 139 10/11/2014 1800   K 3.7 07/07/2023 0957   K  3.3 (L) 10/11/2014 1800   CL 109 07/07/2023 0957   CL 106 10/11/2014 1800   CO2 24 07/07/2023 0957   CO2 27 10/11/2014 1800   GLUCOSE 126 (H) 07/07/2023 0957   GLUCOSE 107 (H) 10/11/2014 1800   BUN 20 07/07/2023 0957   BUN 13 03/11/2021 1136   BUN 15 10/11/2014 1800   CREATININE 1.00 07/07/2023 0957   CREATININE 0.89 10/11/2014 1800   CALCIUM 8.7 (L) 07/07/2023 0957   CALCIUM 8.8 (L) 10/11/2014 1800   PROT 6.6 07/07/2023 0957   PROT 6.7 05/18/2017 1048   PROT 6.4 (L) 10/11/2014 1800   ALBUMIN 4.0 07/07/2023 0957   ALBUMIN 4.3 05/18/2017 1048   ALBUMIN 4.1 10/11/2014 1800   AST 17 07/07/2023 0957   ALT 16 07/07/2023 0957   ALT 22 10/11/2014 1800   ALKPHOS 63 07/07/2023 0957   ALKPHOS 61 10/11/2014 1800   BILITOT 0.5 07/07/2023 0957   GFRNONAA >60 07/07/2023 0957   GFRNONAA >60 10/11/2014 1800   GFRAA >60 04/12/2020 0959   GFRAA >60 10/11/2014 1800    No results found for: "SPEP", "UPEP"  Lab Results  Component Value Date   WBC 11.2 (H) 07/07/2023   NEUTROABS 3.2 07/07/2023   HGB 10.6 (L) 07/07/2023   HCT 32.7 (L) 07/07/2023   MCV 102.8 (H) 07/07/2023   PLT 149 (L) 07/07/2023      Chemistry      Component Value Date/Time   NA 141 07/07/2023 0957   NA 143 03/11/2021 1136   NA 139 10/11/2014 1800   K 3.7 07/07/2023 0957   K 3.3 (L) 10/11/2014 1800   CL 109 07/07/2023 0957   CL 106 10/11/2014 1800   CO2 24 07/07/2023 0957   CO2 27 10/11/2014 1800   BUN 20 07/07/2023 0957   BUN 13 03/11/2021 1136   BUN 15 10/11/2014 1800   CREATININE 1.00 07/07/2023 0957   CREATININE 0.89 10/11/2014 1800      Component Value Date/Time   CALCIUM 8.7 (L) 07/07/2023 0957   CALCIUM 8.8 (L) 10/11/2014 1800   ALKPHOS 63 07/07/2023 0957   ALKPHOS 61 10/11/2014 1800   AST 17 07/07/2023 0957   ALT 16 07/07/2023 0957   ALT 22 10/11/2014 1800   BILITOT 0.5 07/07/2023 0957       RADIOGRAPHIC STUDIES: I have personally reviewed the radiological images as listed and agreed with  the findings in the report. No results found.   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  Patient currently on Acalabrutnib [AUG 16th, 2023]-  Patient admits to compliance; JUNE 13th, 2024- [compared to CT JAN 2024] -Increased adenopathy above and below the diaphragm compatible with progressive lymphomatous disease.  No splenomegaly. However, patient had interruption of his  acal just prior to his CT scan.  Clinically noted to have improvement of his neck lymph nodes.  Continue Acalbrutinib 100 mg  BID.   # Clinically stable; no evidence of any progression - will order imaging today/2 months.   # Neck pain/Joint pains/ Muscle pain- s/p evluation with  Dr.Chasnis/ Dr.Brandon Katrinka Blazing; [facility]. S/p  surgery [AUG 2024]- stable.   # Mild to moderate anemia:[>>> Colo] Continue gentle iron/slow iron. Monitor for now. If  get worse- recommend bone marrow- stable.    # Low immunoglobulins IgG 330/recent severe respiratory infection/CLL-s/pI IVIG #4/4 [last April 2022].MARCH 2024-- IgG > 1500- stable.    # A.fib-on amiodarone [ Dr.Gollan] sinus rhythm-on Eliquis/CHF- lasix 40/d- s/p Cath [AUG, 2022- Echo-55%] stable   # Left sided CVA- [s/p rehab; April 2023]- stable   # Vaccination: s/p Flu shot [2024]; recommend COVID/RSV  DISPOSITION: # follow up in 2 month-  MD; labs- cbc/cmp; LDH; B12;iron studies; ferritin; retic count- CT scan- CAP/neck;  Dr.B    Orders Placed This Encounter  Procedures   CT CHEST ABDOMEN PELVIS W CONTRAST    Standing Status:   Future    Expected Date:   09/07/2023    Expiration Date:   07/06/2024    If indicated for the ordered procedure, I authorize the administration of contrast media per Radiology protocol:   Yes    Does the patient have a contrast media/X-ray dye allergy?:   No    Preferred imaging location?:   [facility]    If indicated for the ordered procedure, I authorize the  administration of oral contrast media per Radiology protocol:   Yes   CT SOFT TISSUE NECK W CONTRAST    Standing Status:   Future    Expected Date:   09/07/2023    Expiration Date:   07/06/2024    If indicated for the ordered procedure, I authorize the administration of contrast media per Radiology protocol:   Yes    Preferred imaging location?:   [facility]   CBC with Differential ([facility] Only)    Standing Status:   Future    Expected Date:   09/08/2023    Expiration Date:   07/06/2024   CMP ([facility] only)    Standing Status:   Future    Expected Date:   09/08/2023    Expiration Date:   07/06/2024   Retic Panel    Standing Status:   Future    Expected Date:   09/08/2023    Expiration Date:   07/06/2024   Lactate dehydrogenase    Standing Status:   Future    Expected Date:   09/08/2023    Expiration Date:   07/06/2024   Ferritin    Standing Status:   Future    Expected Date:   09/08/2023    Expiration Date:   07/06/2024   Iron and TIBC    Standing Status:   Future    Expected Date:   09/08/2023    Expiration Date:   07/06/2024   All questions were answered. The patient knows to call the clinic with any problems, questions or concerns.     Michael Coder, MD 07/07/2023 11:19 AM

## 2023-07-07 NOTE — Progress Notes (Signed)
Forgot hearing aids today.  Had a fall last week, lesion back of head.

## 2023-07-07 NOTE — Assessment & Plan Note (Addendum)
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  Patient currently on Acalabrutnib [AUG 16th, 2023]-  Patient admits to compliance; JUNE 13th, 2024- [compared to CT JAN 2024] -Increased adenopathy above and below the diaphragm compatible with progressive lymphomatous disease.  No splenomegaly. However, patient had interruption of his  acal just prior to his CT scan.  Clinically noted to have improvement of his neck lymph nodes.  Continue Acalbrutinib 100 mg BID.   # Clinically stable; no evidence of any progression - will order imaging today/2 months.   # Neck pain/Joint pains/ Muscle pain- s/p evluation with  Dr.Chasnis/ Dr.Brandon Katrinka Blazing; [facility]. S/p  surgery [AUG 2024]- stable.   # Mild to moderate anemia:[>>> Colo] Continue gentle iron/slow iron. Monitor for now. If  get worse- recommend bone marrow- stable.    # Low immunoglobulins IgG 330/recent severe respiratory infection/CLL-s/pI IVIG #4/4 [last April 2022].MARCH 2024-- IgG > 1500- stable.    # A.fib-on amiodarone [ Dr.Gollan] sinus rhythm-on Eliquis/CHF- lasix 40/d- s/p Cath [AUG, 2022- Echo-55%] stable   # Left sided CVA- [s/p rehab; April 2023]- stable   # Vaccination: s/p Flu shot [2024]; recommend COVID/RSV  DISPOSITION: # follow up in 2 month-  MD; labs- cbc/cmp; LDH; B12;iron studies; ferritin; retic count- CT scan- CAP/neck;  Dr.B

## 2023-07-09 ENCOUNTER — Encounter: Payer: Self-pay | Admitting: Neurosurgery

## 2023-07-09 ENCOUNTER — Ambulatory Visit: Payer: Medicare HMO | Admitting: Neurosurgery

## 2023-07-09 VITALS — BP 146/78 | Ht 68.0 in | Wt 196.0 lb

## 2023-07-09 DIAGNOSIS — Z09 Encounter for follow-up examination after completed treatment for conditions other than malignant neoplasm: Secondary | ICD-10-CM

## 2023-07-09 DIAGNOSIS — G959 Disease of spinal cord, unspecified: Secondary | ICD-10-CM

## 2023-07-09 NOTE — Progress Notes (Signed)
   REFERRING PHYSICIAN:  Nicholaus Corolla [address] [place],  [place] [address]  DOS: 04/21/23 C3-T1 laminoplasty   HISTORY OF PRESENT ILLNESS: [name] is about 12 weeks status post laminoplasty for cervical stenosis and myelopathy.  He is doing very well and has resumed most of his activities.  He denies any new or worsening symptoms   PHYSICAL EXAMINATION:  NEUROLOGICAL:  General: In no acute distress.   Awake, alert, oriented to person, place, and time.  Pupils equal round and reactive to light.  Strength: Side Biceps Triceps Deltoid Interossei Grip Wrist Ext. Wrist Flex.  R 5 5 5 5 5 5 5   L 5 5 5 5 5 5 5    Incision c/d/I and well-healed  Imaging:  No interval imaging to review today  Assessment / Plan: [name] is doing well after his recent laminoplasty.  He has been cleared to resume activities as tolerated.  He will return to clinic in 6 months to see Dr. Katrinka Blazing or sooner should he have any questions or concerns.  He expressed understanding and was in agreement with this plan.  Manning Charity PA-C Dept of Neurosurgery

## 2023-07-10 ENCOUNTER — Encounter: Payer: Self-pay | Admitting: Family Medicine

## 2023-07-10 ENCOUNTER — Ambulatory Visit (INDEPENDENT_AMBULATORY_CARE_PROVIDER_SITE_OTHER): Payer: Medicare HMO | Admitting: Family Medicine

## 2023-07-10 VITALS — BP 124/80 | HR 64 | Temp 97.7°F | Ht 68.0 in | Wt 195.0 lb

## 2023-07-10 DIAGNOSIS — S0990XA Unspecified injury of head, initial encounter: Secondary | ICD-10-CM

## 2023-07-10 DIAGNOSIS — M48062 Spinal stenosis, lumbar region with neurogenic claudication: Secondary | ICD-10-CM

## 2023-07-10 DIAGNOSIS — I480 Paroxysmal atrial fibrillation: Secondary | ICD-10-CM | POA: Diagnosis not present

## 2023-07-10 DIAGNOSIS — I5032 Chronic diastolic (congestive) heart failure: Secondary | ICD-10-CM

## 2023-07-10 DIAGNOSIS — I10 Essential (primary) hypertension: Secondary | ICD-10-CM

## 2023-07-10 NOTE — Assessment & Plan Note (Signed)
Chronic issue.  Adequately controlled.  Plan to continue to monitor blood pressures.

## 2023-07-10 NOTE — Assessment & Plan Note (Signed)
Chronic issue.  Sinus rhythm today.  Patient will continue Eliquis.  Amiodarone managed by cardiology.

## 2023-07-10 NOTE — Assessment & Plan Note (Signed)
Chronic issue.  Asymptomatic.  Monitor for fluid retention.

## 2023-07-10 NOTE — Progress Notes (Signed)
[name], MD Phone: [phone]  [name] is a 77 y.o. male who presents today for follow-up.  Hypertension/CHF/atrial fibrillation: Patient has no chest pain, edema, orthopnea, PND, or palpitations.  He notes no excessive bleeding issues.  He notes stable dyspnea.  He is on Eliquis though this is not on his medication list.  Status post laminoplasty: Patient reports some soreness though no pain related to this surgery.  Notes the pain in his legs has improved after surgery.  No numbness or weakness.  Head injury: Patient notes he was in his shed and lost his balance and hit his head.  This occurred a week ago.  He is not had any headaches.  He has an area that is a scab now.  Social History   Tobacco Use  Smoking Status Former   Current packs/day: 0.00   Average packs/day: 1 pack/day for 40.0 years (40.0 ttl pk-yrs)   Types: Cigarettes   Start date: 07/21/1966   Quit date: 07/21/2006   Years since quitting: 16.9  Smokeless Tobacco Never    Current Outpatient Medications on File Prior to Visit  Medication Sig Dispense Refill   acalabrutinib maleate (CALQUENCE) 100 MG tablet Take 1 tablet (100 mg total) by mouth 2 (two) times daily. 60 tablet 2   acetaminophen (TYLENOL) 325 MG tablet Take 1-2 tablets (325-650 mg total) by mouth every 4 (four) hours as needed for mild pain.     albuterol (VENTOLIN HFA) 108 (90 Base) MCG/ACT inhaler Inhale 2 puffs into the lungs every 6 (six) hours as needed for wheezing or shortness of breath. 1 each 0   amiodarone (PACERONE) 200 MG tablet Take 1/2 tablet (100 mg) by mouth twice daily 5 days a week (Patient taking differently: Take 100 mg by mouth See admin instructions. Take 1/2 tablet (100 mg) by mouth twice daily-takes 5 days a week. Mon, Tues, Thurs, Fri, Sat) 60 tablet 6   Apple Cider Vinegar 500 MG TABS Take 1 tablet by mouth daily.     azelastine (ASTELIN) 0.1 % nasal spray Place 2 sprays into both nostrils 2 (two) times daily. Use in  each nostril as directed 30 mL 1   cetirizine (ZYRTEC) 10 MG tablet Take 10 mg by mouth daily as needed for allergies.      Cholecalciferol (VITAMIN D3) 250 MCG (10000 UT) capsule Take 10,000 Units by mouth daily.     Coenzyme Q10 (COQ10) 200 MG CAPS Take 200 mg by mouth daily.     cyanocobalamin (VITAMIN B12) 1000 MCG tablet Take 1,000 mcg by mouth daily.     Evolocumab (REPATHA SURECLICK) 140 MG/ML SOAJ Inject 140 mg into the skin every 14 (fourteen) days. 2 mL 11   ezetimibe (ZETIA) 10 MG tablet Take 1 tablet (10 mg total) by mouth daily. (Patient taking differently: Take 10 mg by mouth every morning.) 90 tablet 3   ferrous sulfate 325 (65 FE) MG EC tablet Take 325 mg by mouth daily.     fluticasone-salmeterol (WIXELA INHUB) 100-50 MCG/ACT AEPB Inhale 2 puffs into the lungs 2 (two) times daily.     mirtazapine (REMERON) 15 MG tablet Take 15 mg by mouth at bedtime as needed (for panic associated with PTSD).     mometasone-formoterol (DULERA) 200-5 MCG/ACT AERO Inhale 2 puffs into the lungs 2 (two) times daily.     Multiple Vitamin (MULTIVITAMIN WITH MINERALS) TABS tablet Take 1 tablet by mouth daily.     pantoprazole (PROTONIX) 40 MG tablet TAKE 1 TABLET(40  MG) BY MOUTH DAILY 90 tablet 1   senna (SENOKOT) 8.6 MG TABS tablet Take 1 tablet (8.6 mg total) by mouth daily as needed for mild constipation. 30 tablet 0   tamsulosin (FLOMAX) 0.4 MG CAPS capsule TAKE 1 CAPSULE(0.4 MG) BY MOUTH DAILY 30 capsule 3   Tiotropium Bromide Monohydrate 2.5 MCG/ACT AERS Inhale 2 puffs into the lungs at bedtime. Spiriva     tiZANidine (ZANAFLEX) 4 MG capsule Take 1 capsule (4 mg total) by mouth 3 (three) times daily. This can make you sleepy. 60 capsule 0   traMADol (ULTRAM) 50 MG tablet Take 50 mg by mouth 2 (two) times daily.     Current Facility-Administered Medications on File Prior to Visit  Medication Dose Route Frequency Provider Last Rate Last Admin   potassium chloride 10 mEq in 100 mL IVPB  10 mEq  Intravenous Once Borders, Daryl Eastern, NP         ROS see history of present illness  Objective  Physical Exam Vitals:   07/10/23 1120  BP: 124/80  Pulse: 64  Temp: 97.7 F (36.5 C)  SpO2: 99%    BP Readings from Last 3 Encounters:  07/10/23 124/80  07/09/23 (!) 146/78  07/07/23 131/71   Wt Readings from Last 3 Encounters:  07/10/23 195 lb (88.5 kg)  07/09/23 196 lb (88.9 kg)  07/07/23 196 lb 6.4 oz (89.1 kg)    Physical Exam Constitutional:      General: He is not in acute distress.    Appearance: He is not diaphoretic.  HENT:     Head:   Cardiovascular:     Rate and Rhythm: Normal rate and regular rhythm.     Heart sounds: Normal heart sounds.  Pulmonary:     Effort: Pulmonary effort is normal.     Breath sounds: Normal breath sounds.  Skin:    General: Skin is warm and dry.  Neurological:     Mental Status: He is alert.     Comments: CN 3-12 intact, 5/5 strength in bilateral biceps, triceps, grip, quads, hamstrings, plantar and dorsiflexion, sensation to light touch intact in bilateral UE and LE      Assessment/Plan: Please see individual problem list.  Essential hypertension Assessment & Plan: Chronic issue.  Adequately controlled.  Plan to continue to monitor blood pressures.   Chronic diastolic CHF (congestive heart failure) (HCC) Assessment & Plan: Chronic issue.  Asymptomatic.  Monitor for fluid retention.   Paroxysmal atrial fibrillation (HCC) Assessment & Plan: Chronic issue.  Sinus rhythm today.  Patient will continue Eliquis.  Amiodarone managed by cardiology.   Injury of head, initial encounter Assessment & Plan: Discussed getting CT imaging of his head given that he is on a blood thinner though he defers this.  Advised if he developed any headaches or neurological symptoms he would need to be evaluated right away.  He will monitor the area of scabbing if it develops signs of infection he will be evaluated.   Spinal stenosis of lumbar  region with neurogenic claudication Assessment & Plan: Chronic issue.  Status post laminoplasty for this.  Leg symptoms have improved with surgery.  He will continue to follow with neurosurgery.     Return in about 6 months (around 01/08/2024) for Transfer of care.   [name], MD [facility]

## 2023-07-10 NOTE — Patient Instructions (Signed)
If you develop headache, numbness, weakness, vision changes, or any new neurological symptoms please seek medical attention immediately.  If you develop signs of infection around the site of the scab please seek medical attention immediately.

## 2023-07-10 NOTE — Assessment & Plan Note (Signed)
Chronic issue.  Status post laminoplasty for this.  Leg symptoms have improved with surgery.  He will continue to follow with neurosurgery.

## 2023-07-10 NOTE — Assessment & Plan Note (Signed)
Discussed getting CT imaging of his head given that he is on a blood thinner though he defers this.  Advised if he developed any headaches or neurological symptoms he would need to be evaluated right away.  He will monitor the area of scabbing if it develops signs of infection he will be evaluated.

## 2023-08-03 ENCOUNTER — Other Ambulatory Visit: Payer: Self-pay

## 2023-08-04 ENCOUNTER — Ambulatory Visit (INDEPENDENT_AMBULATORY_CARE_PROVIDER_SITE_OTHER): Payer: Medicare HMO

## 2023-08-04 DIAGNOSIS — I495 Sick sinus syndrome: Secondary | ICD-10-CM

## 2023-08-05 ENCOUNTER — Other Ambulatory Visit: Payer: Self-pay

## 2023-08-05 LAB — CUP PACEART REMOTE DEVICE CHECK
Battery Remaining Longevity: 30 mo
Battery Voltage: 2.95 V
Brady Statistic AP VP Percent: 0.68 %
Brady Statistic AP VS Percent: 77.89 %
Brady Statistic AS VP Percent: 0.06 %
Brady Statistic AS VS Percent: 21.38 %
Brady Statistic RA Percent Paced: 78.56 %
Brady Statistic RV Percent Paced: 0.78 %
Date Time Interrogation Session: 20250114154936
Implantable Lead Connection Status: 753985
Implantable Lead Connection Status: 753985
Implantable Lead Implant Date: 20170814
Implantable Lead Implant Date: 20170814
Implantable Lead Location: 753859
Implantable Lead Location: 753860
Implantable Lead Model: 5076
Implantable Lead Model: 5076
Implantable Pulse Generator Implant Date: 20170814
Lead Channel Impedance Value: 342 Ohm
Lead Channel Impedance Value: 456 Ohm
Lead Channel Impedance Value: 532 Ohm
Lead Channel Impedance Value: 627 Ohm
Lead Channel Pacing Threshold Amplitude: 0.75 V
Lead Channel Pacing Threshold Amplitude: 0.75 V
Lead Channel Pacing Threshold Pulse Width: 0.4 ms
Lead Channel Pacing Threshold Pulse Width: 0.4 ms
Lead Channel Sensing Intrinsic Amplitude: 2.5 mV
Lead Channel Sensing Intrinsic Amplitude: 2.5 mV
Lead Channel Sensing Intrinsic Amplitude: 22.875 mV
Lead Channel Sensing Intrinsic Amplitude: 22.875 mV
Lead Channel Setting Pacing Amplitude: 2 V
Lead Channel Setting Pacing Amplitude: 2.5 V
Lead Channel Setting Pacing Pulse Width: 0.4 ms
Lead Channel Setting Sensing Sensitivity: 0.9 mV
Zone Setting Status: 755011
Zone Setting Status: 755011

## 2023-08-05 NOTE — Progress Notes (Signed)
 Specialty Pharmacy Ongoing Clinical Assessment Note  [name] is a 78 y.o. male who is being followed by the specialty pharmacy service for RxSp Oncology   Patient's specialty medication(s) reviewed today: Acalabrutinib  Maleate (Calquence )   Missed doses in the last 4 weeks: 0   Patient/Caregiver did not have any additional questions or concerns.   Therapeutic benefit summary: Patient is achieving benefit   Adverse events/side effects summary: No adverse events/side effects   Patient's therapy is appropriate to: Continue    Goals Addressed             This Visit's Progress    Slow Disease Progression       Patient is on track. Patient will maintain adherence. Per provider notes from 07/07/23 no clinical sign of progression.          Follow up:  6 months  Jaleeah Slight M Shaquila Sigman Specialty Pharmacist

## 2023-08-05 NOTE — Progress Notes (Signed)
 Specialty Pharmacy Refill Coordination Note  [name] is a 78 y.o. male contacted today regarding refills of specialty medication(s) Acalabrutinib  Maleate (Calquence )   Patient requested Delivery   Delivery date: 08/18/23   Verified address: [address]  [place]   Medication will be filled on 08/17/23.

## 2023-08-17 ENCOUNTER — Other Ambulatory Visit: Payer: Self-pay

## 2023-09-03 ENCOUNTER — Other Ambulatory Visit: Payer: Self-pay | Admitting: Internal Medicine

## 2023-09-03 ENCOUNTER — Other Ambulatory Visit: Payer: Self-pay

## 2023-09-03 DIAGNOSIS — C9110 Chronic lymphocytic leukemia of B-cell type not having achieved remission: Secondary | ICD-10-CM

## 2023-09-03 MED ORDER — CALQUENCE 100 MG PO TABS
100.0000 mg | ORAL_TABLET | Freq: Two times a day (BID) | ORAL | 2 refills | Status: DC
  Filled 2023-09-03: qty 60, 30d supply, fill #0
  Filled 2023-10-09: qty 60, 30d supply, fill #1
  Filled 2023-11-04: qty 60, 30d supply, fill #2

## 2023-09-03 NOTE — Progress Notes (Signed)
Specialty Pharmacy Refill Coordination Note  [name] is a 78 y.o. male contacted today regarding refills of specialty medication(s) Acalabrutinib Maleate (Calquence)   Patient requested Delivery   Delivery date: 09/14/23   Verified address: [address]  [place]   Medication will be filled on 02.21.25.   This fill date is pending response to refill request from provider. Patient is aware and if they have not received fill by intended date they must follow up with pharmacy.

## 2023-09-04 ENCOUNTER — Ambulatory Visit
Admission: RE | Admit: 2023-09-04 | Discharge: 2023-09-04 | Disposition: A | Discharge status: Home / Self Care | Payer: Medicare HMO | Source: Ambulatory Visit | Attending: Internal Medicine | Admitting: Internal Medicine

## 2023-09-04 ENCOUNTER — Other Ambulatory Visit: Payer: Self-pay

## 2023-09-04 DIAGNOSIS — C9110 Chronic lymphocytic leukemia of B-cell type not having achieved remission: Secondary | ICD-10-CM | POA: Insufficient documentation

## 2023-09-04 MED ORDER — SODIUM CHLORIDE 0.9 % IV SOLN: INTRAVENOUS | Status: DC

## 2023-09-04 MED ORDER — IOHEXOL 300 MG/ML  SOLN
100.0000 mL | Freq: Once | INTRAMUSCULAR | Status: AC | PRN
  Administered 2023-09-04: 100 mL via INTRAVENOUS

## 2023-09-11 ENCOUNTER — Other Ambulatory Visit: Payer: Self-pay

## 2023-09-14 ENCOUNTER — Other Ambulatory Visit: Payer: Self-pay

## 2023-09-14 DIAGNOSIS — C9110 Chronic lymphocytic leukemia of B-cell type not having achieved remission: Secondary | ICD-10-CM

## 2023-09-15 ENCOUNTER — Inpatient Hospital Stay: Payer: Medicare HMO | Admitting: Internal Medicine

## 2023-09-15 ENCOUNTER — Inpatient Hospital Stay: Payer: Medicare HMO | Attending: Internal Medicine

## 2023-09-15 ENCOUNTER — Encounter: Payer: Self-pay | Admitting: Internal Medicine

## 2023-09-15 VITALS — BP 145/64 | HR 72 | Temp 97.9°F | Ht 68.0 in | Wt 197.6 lb

## 2023-09-15 DIAGNOSIS — I4891 Unspecified atrial fibrillation: Secondary | ICD-10-CM | POA: Insufficient documentation

## 2023-09-15 DIAGNOSIS — C9110 Chronic lymphocytic leukemia of B-cell type not having achieved remission: Secondary | ICD-10-CM | POA: Diagnosis present

## 2023-09-15 DIAGNOSIS — G8929 Other chronic pain: Secondary | ICD-10-CM | POA: Diagnosis not present

## 2023-09-15 DIAGNOSIS — M542 Cervicalgia: Secondary | ICD-10-CM | POA: Diagnosis not present

## 2023-09-15 DIAGNOSIS — M2550 Pain in unspecified joint: Secondary | ICD-10-CM | POA: Insufficient documentation

## 2023-09-15 DIAGNOSIS — Z8673 Personal history of transient ischemic attack (TIA), and cerebral infarction without residual deficits: Secondary | ICD-10-CM | POA: Diagnosis not present

## 2023-09-15 DIAGNOSIS — Z87891 Personal history of nicotine dependence: Secondary | ICD-10-CM | POA: Insufficient documentation

## 2023-09-15 DIAGNOSIS — Z7901 Long term (current) use of anticoagulants: Secondary | ICD-10-CM | POA: Insufficient documentation

## 2023-09-15 DIAGNOSIS — D649 Anemia, unspecified: Secondary | ICD-10-CM | POA: Insufficient documentation

## 2023-09-15 DIAGNOSIS — Z79899 Other long term (current) drug therapy: Secondary | ICD-10-CM | POA: Diagnosis not present

## 2023-09-15 DIAGNOSIS — J449 Chronic obstructive pulmonary disease, unspecified: Secondary | ICD-10-CM | POA: Insufficient documentation

## 2023-09-15 LAB — CBC WITH DIFFERENTIAL (CANCER CENTER ONLY)
Abs Immature Granulocytes: 0.04 10*3/uL (ref 0.00–0.07)
Basophils Absolute: 0 10*3/uL (ref 0.0–0.1)
Basophils Relative: 0 %
Eosinophils Absolute: 0.1 10*3/uL (ref 0.0–0.5)
Eosinophils Relative: 1 %
HCT: 37.1 % — ABNORMAL LOW (ref 39.0–52.0)
Hemoglobin: 11.9 g/dL — ABNORMAL LOW (ref 13.0–17.0)
Immature Granulocytes: 0 %
Lymphocytes Relative: 65 %
Lymphs Abs: 7 10*3/uL — ABNORMAL HIGH (ref 0.7–4.0)
MCH: 33.1 pg (ref 26.0–34.0)
MCHC: 32.1 g/dL (ref 30.0–36.0)
MCV: 103.1 fL — ABNORMAL HIGH (ref 80.0–100.0)
Monocytes Absolute: 0.5 10*3/uL (ref 0.1–1.0)
Monocytes Relative: 5 %
Neutro Abs: 3.2 10*3/uL (ref 1.7–7.7)
Neutrophils Relative %: 29 %
Platelet Count: 144 10*3/uL — ABNORMAL LOW (ref 150–400)
RBC: 3.6 MIL/uL — ABNORMAL LOW (ref 4.22–5.81)
RDW: 14 % (ref 11.5–15.5)
Smear Review: NORMAL
WBC Count: 10.9 10*3/uL — ABNORMAL HIGH (ref 4.0–10.5)
nRBC: 0 % (ref 0.0–0.2)

## 2023-09-15 LAB — CMP (CANCER CENTER ONLY)
ALT: 16 U/L (ref 0–44)
AST: 16 U/L (ref 15–41)
Albumin: 4.1 g/dL (ref 3.5–5.0)
Alkaline Phosphatase: 72 U/L (ref 38–126)
Anion gap: 5 (ref 5–15)
BUN: 26 mg/dL — ABNORMAL HIGH (ref 8–23)
CO2: 27 mmol/L (ref 22–32)
Calcium: 8.8 mg/dL — ABNORMAL LOW (ref 8.9–10.3)
Chloride: 108 mmol/L (ref 98–111)
Creatinine: 0.97 mg/dL (ref 0.61–1.24)
GFR, Estimated: >60 mL/min (ref >60)
Glucose, Bld: 120 mg/dL — ABNORMAL HIGH (ref 70–99)
Potassium: 4.1 mmol/L (ref 3.5–5.1)
Sodium: 140 mmol/L (ref 135–145)
Total Bilirubin: 0.7 mg/dL (ref 0.0–1.2)
Total Protein: 6.9 g/dL (ref 6.5–8.1)

## 2023-09-15 LAB — FERRITIN: Ferritin: 52 ng/mL (ref 24–336)

## 2023-09-15 LAB — VITAMIN B12: Vitamin B-12: 1136 pg/mL — ABNORMAL HIGH (ref 180–914)

## 2023-09-15 LAB — RETIC PANEL
Immature Retic Fract: 12.9 % (ref 2.3–15.9)
RBC.: 3.57 MIL/uL — ABNORMAL LOW (ref 4.22–5.81)
Retic Count, Absolute: 90.7 10*3/uL (ref 19.0–186.0)
Retic Ct Pct: 2.5 % (ref 0.4–3.1)
Reticulocyte Hemoglobin: 35.3 pg (ref >27.9)

## 2023-09-15 LAB — IRON AND TIBC
Iron: 86 ug/dL (ref 45–182)
Saturation Ratios: 24 % (ref 17.9–39.5)
TIBC: 364 ug/dL (ref 250–450)
UIBC: 278 ug/dL

## 2023-09-15 LAB — LACTATE DEHYDROGENASE: LDH: 121 U/L (ref 98–192)

## 2023-09-15 NOTE — Progress Notes (Signed)
 CT neck 09/04/23. Called to have read.  Fell 3 weeks ago. No injury.  C/o coughing all the time x1-2 weeks.

## 2023-09-15 NOTE — Progress Notes (Signed)
 [facility] OFFICE PROGRESS NOTE  Patient Care Team: Glori Luis, MD as PCP - General (Family Medicine) Antonieta Iba, MD as PCP - Cardiology (Cardiology) Duke Salvia, MD as PCP - Electrophysiology (Cardiology) Antonieta Iba, MD as Consulting Physician (Cardiology) Earna Coder, MD as Medical Oncologist (Hematology and Oncology) Keitha Butte, RN as Registered Nurse (Oncology)   Cancer Staging  No matching staging information was found for the patient.    Oncology History Overview Note  # AUG 2015- SLL/CLL [Right Ax Ln Bx] s/p Benda-Rituxan x6 [finished March 2016]; Maintenance Rituxan q 61m [started April 2016; Dr.Pandit];Last Ritux Jan 2017.  MARCH 2017- CT N/C/A/P- NED. STOP Ritux; surveillance   # AUG 2019- CT/PET- progression/NO transformation;  # NOV 2019- Progression; started Ibrutinib 420 mg/d. STOPPED in end of feb sec to extreme fatigue/joint pains/cramps  #November 01, 2018-start ibrutinib 280 mg a day; December 20, 2018-discontinue ibrutinib secondary multiple side effects.   #January 03, 2019 start Gazyva + Ven [July]; finished Dec 2020-; started Peter Minium maintenance [200 mg a day; DI-amiodarone]; SEP 2022- congestive heart failure; s/p cardiac cath-noted to have CAD however no stents placed.  2D echo- EF-55%  #Mid March 2021-venetoclax dose reduced to 100 mg [second diarrhea]. HELD in DEC 2022/JAN 2023 x3 months-multiple admissions to the hospital/pneumonia infection stroke x 3 m.   # MAY 2023-progression of disease in the neck ches; Nov 18, 2021-RE-started venetoclax;   # JUNE 29th, 2023-progression of disease in the neck- STOP VENAOCLAX;   # AUG 16th, 2023 START ACALABRUTINIB 100 mg BID [previous intolerance to ibrutinib; delayed sec to C.diff]    #? 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 Ambulating with cane.    [name] 78 y.o.  male CLL-high risk of recurrent/relapsed currently on  alcabrutinib; afib on eliquis, COPD-  is here for a follow up- and review the results of the CT scan.   Patient s/p  neck surgery- improved- pain.   He is able to walk better.   Patient had a recent fall- mechanical.  No night sweats.  Appetite is normal. Energy is up and down. Complains of chronic joint pain.  Otherwise no fever no chills.  No nausea no vomiting.  No headache.  Review of Systems  Constitutional:  Positive for malaise/fatigue. Negative for chills, diaphoresis and fever.  HENT:  Negative for nosebleeds and sore throat.   Eyes:  Negative for double vision.  Respiratory:  Negative for hemoptysis, shortness of breath and wheezing.   Cardiovascular:  Negative for chest pain, palpitations, orthopnea and leg swelling.  Gastrointestinal:  Negative for abdominal pain, blood in stool, constipation, heartburn, melena, nausea and vomiting.  Genitourinary:  Negative for dysuria, frequency and urgency.  Musculoskeletal:  Positive for back pain, joint pain and myalgias.  Skin: Negative.  Negative for itching and rash.  Neurological:  Negative for dizziness,  tingling, focal weakness and headaches.  Psychiatric/Behavioral:  Negative for depression. The patient is not nervous/anxious and does not have insomnia.    PAST MEDICAL HISTORY :  Past Medical History:  Diagnosis Date   Agent orange exposure     Anxiety    Arthritis    Atrial fibrillation (HCC) 2013   a.) Dx'd in 2013; recurrence 02/2014; b.) CHA2DS2-VASc = 7 (age x2, HFimpEF, HTN, CVA x 2, previous MI); c.) cardiac rate/rhythm maintained on oral amiodarone; chronically anticoagulated using apixaban   BPH (benign prostatic hyperplasia)    C. 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)    a.) MV 08/23/2015: EF 50%; b.) TTE 08/23/2015: EF 55-60%, G1DD; c.) TTE 11/02/2015: EF 55-60%, mild LA dil; d.) TTE 02/04/2020: EF 55-60%, mild LVH, G1DD, PASP 44.3, mild-mod AoV sclerosis; e.) MV 12/25/2020: EF 45-54%; f.)  R/LHC 02/04/2021: mRA 13, mPA 27, mPCWP 22, LVEDP 23, Ao sat 99, PA sat 70, CO 6.5, CI 3.0; g.) TTE 02/19/2021: EF 55-60%, G1DD, mildly red RVSF, triv MR, mild-mod AoV scler   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 EF 55%, mild antlat HK;  c.) s/p R/LHC 02/04/2021: LM mild diff dzs, LAD 100p, LCX nl, RCA 50p, 95d, 99 side branch, RPAV fills via LCx. SVG->RPDA 100, SVG->OM1 100, LIMA->LAD patent. EF 35-45%.   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    Hyperlipidemia LDL goal <70    Hypertension     Hypokalemia    Ischemic cardiomyopathy    a.) MV 08/23/2015: EF 50%; b.) TTE 08/23/2015: EF 55-60%; c.) TTE 11/02/2015: EF 55-60%; d.) TTE 02/04/2020: EF 55-60%; e.) MV 12/25/2020: EF 45-54%; f.) R/LHC 02/04/2021: mRA 13, mPA 27, mPCWP 22, LVEDP 23, Ao sat 99, PA sat 70, CO 6.5, CI 3.0; g.) TTE 02/19/2021: EF 55-60%   Ischemic cerebrovascular accident (CVA) (HCC) 10/18/2021   Myocardial infarction (HCC) 2010   On amiodarone therapy    On apixaban therapy    OSA on CPAP    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   TIA (transient ischemic attack) 11/02/2015   PAST SURGICAL HISTORY :   Past Surgical History:  Procedure Laterality Date   ABDOMINAL HERNIA REPAIR     APPENDECTOMY  06/21/1985   CORONARY ARTERY BYPASS GRAFT N/A 05/09/2009   EP IMPLANTABLE DEVICE N/A 03/03/2016   Procedure: Pacemaker Implant;  Surgeon: Duke Salvia, MD;  Location[facility] INVASIVE CV LAB;  Service: Cardiovascular;  Laterality: N/A;   FOREIGN BODY REMOVAL  1968   "shrapnel in my tailbone"   INGUINAL HERNIA REPAIR Right    JOINT REPLACEMENT Right 2018   LAPAROSCOPIC CHOLECYSTECTOMY     LEFT HEART CATH AND CORONARY ANGIOGRAPHY Left 05/08/2009   Procedure: LEFT HEART CATH AND CORONARY ANGIOGRAPHY; Location: [facility]: Surgeon: Lorine Bears, MD   LEFT HEART CATH AND CORS/GRAFTS ANGIOGRAPHY Left 10/17/2009   Procedure: LEFT HEART CATH AND CORS/GRAFTS  ANGIOGRAPHY; Location: [facility]; Surgeon: Lorine Bears, MD   RIGHT/LEFT HEART CATH AND CORONARY/GRAFT ANGIOGRAPHY N/A 02/04/2021   Procedure: RIGHT/LEFT HEART CATH AND CORONARY/GRAFT ANGIOGRAPHY;  Surgeon: Yvonne Kendall, MD;  Location: [facility] INVASIVE CV LAB;  Service: Cardiovascular;  Laterality: N/A;   TONSILLECTOMY AND ADENOIDECTOMY  1956   TOTAL HIP ARTHROPLASTY Right 10/22/2016   Procedure: TOTAL HIP ARTHROPLASTY;  Surgeon: Donato Heinz, MD;  Location: [facility] ORS;  Service: Orthopedics;  Laterality: Right;   TOTAL HIP ARTHROPLASTY Left 11/04/2017   Procedure: TOTAL HIP ARTHROPLASTY;  Surgeon: Donato Heinz, MD;  Location: [facility] ORS;  Service: Orthopedics;  Laterality: Left;    FAMILY HISTORY :   Family History  Problem Relation Age of Onset   Heart disease Mother    Heart attack Mother    Coronary artery disease Other        family history    SOCIAL HISTORY:   Social History   Tobacco Use   Smoking status: Former    Current packs/day: 0.00    Average packs/day: 1 pack/day for 40.0 years (40.0 ttl pk-yrs)    Types: Cigarettes    Start date: 07/21/1966    Quit date: 07/21/2006    Years since quitting: 17.1   Smokeless tobacco: Never  Vaping Use   Vaping status: Former  Substance Use Topics   Alcohol use: Not Currently   Drug use: No    ALLERGIES:  is allergic to atorvastatin and augmentin [amoxicillin-pot clavulanate].  MEDICATIONS:  Current Outpatient Medications  Medication Sig Dispense Refill   acalabrutinib maleate (CALQUENCE) 100 MG tablet Take 1 tablet (100 mg total) by mouth 2 (two) times daily. 60 tablet 2   acetaminophen (TYLENOL) 325 MG tablet Take 1-2 tablets (325-650 mg total) by mouth every 4 (four) hours as needed for mild pain.     albuterol (VENTOLIN HFA) 108 (90 Base) MCG/ACT inhaler Inhale 2 puffs into the lungs every 6 (six) hours as needed for wheezing or shortness of breath. 1 each 0   amiodarone (PACERONE) 200 MG tablet Take 1/2 tablet (100 mg) by  mouth twice daily 5 days a week (Patient taking differently: Take 100 mg by mouth See admin instructions. Take 1/2 tablet (100 mg) by mouth twice daily-takes 5 days a week. Mon, Tues, Thurs, Fri, Sat) 60 tablet 6   Apple Cider Vinegar 500 MG TABS Take 1 tablet by mouth daily.     azelastine (ASTELIN) 0.1 % nasal spray Place 2 sprays into both nostrils 2 (two) times daily. Use in each nostril as directed 30 mL 1   cetirizine (ZYRTEC) 10  MG tablet Take 10 mg by mouth daily as needed for allergies.      Cholecalciferol (VITAMIN D3) 250 MCG (10000 UT) capsule Take 10,000 Units by mouth daily.     Coenzyme Q10 (COQ10) 200 MG CAPS Take 200 mg by mouth daily.     cyanocobalamin (VITAMIN B12) 1000 MCG tablet Take 1,000 mcg by mouth daily.     Evolocumab (REPATHA SURECLICK) 140 MG/ML SOAJ Inject 140 mg into the skin every 14 (fourteen) days. 2 mL 11   ezetimibe (ZETIA) 10 MG tablet Take 1 tablet (10 mg total) by mouth daily. (Patient taking differently: Take 10 mg by mouth every morning.) 90 tablet 3   ferrous sulfate 325 (65 FE) MG EC tablet Take 325 mg by mouth daily.     fluticasone-salmeterol (WIXELA INHUB) 100-50 MCG/ACT AEPB Inhale 2 puffs into the lungs 2 (two) times daily.     mirtazapine (REMERON) 15 MG tablet Take 15 mg by mouth at bedtime as needed (for panic associated with PTSD).     mometasone-formoterol (DULERA) 200-5 MCG/ACT AERO Inhale 2 puffs into the lungs 2 (two) times daily.     Multiple Vitamin (MULTIVITAMIN WITH MINERALS) TABS tablet Take 1 tablet by mouth daily.     pantoprazole (PROTONIX) 40 MG tablet TAKE 1 TABLET(40 MG) BY MOUTH DAILY 90 tablet 1   senna (SENOKOT) 8.6 MG TABS tablet Take 1 tablet (8.6 mg total) by mouth daily as needed for mild constipation. 30 tablet 0   tamsulosin (FLOMAX) 0.4 MG CAPS capsule TAKE 1 CAPSULE(0.4 MG) BY MOUTH DAILY 30 capsule 3   Tiotropium Bromide Monohydrate 2.5 MCG/ACT AERS Inhale 2 puffs into the lungs at bedtime. Spiriva     tiZANidine  (ZANAFLEX) 4 MG capsule Take 1 capsule (4 mg total) by mouth 3 (three) times daily. This can make you sleepy. 60 capsule 0   traMADol (ULTRAM) 50 MG tablet Take 50 mg by mouth 2 (two) times daily.     No current facility-administered medications for this visit.   Facility-Administered Medications Ordered in Other Visits  Medication Dose Route Frequency Provider Last Rate Last Admin   potassium chloride 10 mEq in 100 mL IVPB  10 mEq Intravenous Once Borders, Daryl Eastern, NP        PHYSICAL EXAMINATION: ECOG PERFORMANCE STATUS: 1 - Symptomatic but completely ambulatory  BP (!) 145/64 (BP Location: Left Arm, Patient Position: Sitting, Cuff Size: Large)   Pulse 72   Temp 97.9 F (36.6 C) (Oral)   Ht 5\' 8"  (1.727 m)   Wt 197 lb 9.6 oz (89.6 kg)   SpO2 99%   BMI 30.04 kg/m   Filed Weights   09/15/23 1050  Weight: 197 lb 9.6 oz (89.6 kg)    Physical Exam HENT:     Head: Normocephalic and atraumatic.     Mouth/Throat:     Pharynx: No oropharyngeal exudate.  Eyes:     Pupils: Pupils are equal, round, and reactive to light.  Cardiovascular:     Rate and Rhythm: Normal rate and regular rhythm.  Pulmonary:     Effort: No respiratory distress.     Breath sounds: No wheezing.  Abdominal:     General: Bowel sounds are normal. There is no distension.     Palpations: Abdomen is soft. There is no mass.     Tenderness: There is no abdominal tenderness. There is no guarding or rebound.  Musculoskeletal:        General: No tenderness. Normal range of  motion.     Cervical back: Normal range of motion and neck supple.  Skin:    General: Skin is warm.  Neurological:     Mental Status: He is alert and oriented to person, place, and time.  Psychiatric:        Mood and Affect: Affect normal.     LABORATORY DATA:  I have reviewed the data as listed    Component Value Date/Time   NA 140 09/15/2023 1040   NA 143 03/11/2021 1136   NA 139 10/11/2014 1800   K 4.1 09/15/2023 1040   K 3.3  (L) 10/11/2014 1800   CL 108 09/15/2023 1040   CL 106 10/11/2014 1800   CO2 27 09/15/2023 1040   CO2 27 10/11/2014 1800   GLUCOSE 120 (H) 09/15/2023 1040   GLUCOSE 107 (H) 10/11/2014 1800   BUN 26 (H) 09/15/2023 1040   BUN 13 03/11/2021 1136   BUN 15 10/11/2014 1800   CREATININE 0.97 09/15/2023 1040   CREATININE 0.89 10/11/2014 1800   CALCIUM 8.8 (L) 09/15/2023 1040   CALCIUM 8.8 (L) 10/11/2014 1800   PROT 6.9 09/15/2023 1040   PROT 6.7 05/18/2017 1048   PROT 6.4 (L) 10/11/2014 1800   ALBUMIN 4.1 09/15/2023 1040   ALBUMIN 4.3 05/18/2017 1048   ALBUMIN 4.1 10/11/2014 1800   AST 16 09/15/2023 1040   ALT 16 09/15/2023 1040   ALT 22 10/11/2014 1800   ALKPHOS 72 09/15/2023 1040   ALKPHOS 61 10/11/2014 1800   BILITOT 0.7 09/15/2023 1040   GFRNONAA >60 09/15/2023 1040   GFRNONAA >60 10/11/2014 1800   GFRAA >60 04/12/2020 0959   GFRAA >60 10/11/2014 1800    No results found for: "SPEP", "UPEP"  Lab Results  Component Value Date   WBC 10.9 (H) 09/15/2023   NEUTROABS 3.2 09/15/2023   HGB 11.9 (L) 09/15/2023   HCT 37.1 (L) 09/15/2023   MCV 103.1 (H) 09/15/2023   PLT 144 (L) 09/15/2023      Chemistry      Component Value Date/Time   NA 140 09/15/2023 1040   NA 143 03/11/2021 1136   NA 139 10/11/2014 1800   K 4.1 09/15/2023 1040   K 3.3 (L) 10/11/2014 1800   CL 108 09/15/2023 1040   CL 106 10/11/2014 1800   CO2 27 09/15/2023 1040   CO2 27 10/11/2014 1800   BUN 26 (H) 09/15/2023 1040   BUN 13 03/11/2021 1136   BUN 15 10/11/2014 1800   CREATININE 0.97 09/15/2023 1040   CREATININE 0.89 10/11/2014 1800      Component Value Date/Time   CALCIUM 8.8 (L) 09/15/2023 1040   CALCIUM 8.8 (L) 10/11/2014 1800   ALKPHOS 72 09/15/2023 1040   ALKPHOS 61 10/11/2014 1800   AST 16 09/15/2023 1040   ALT 16 09/15/2023 1040   ALT 22 10/11/2014 1800   BILITOT 0.7 09/15/2023 1040       RADIOGRAPHIC STUDIES: I have personally reviewed the radiological images as listed and agreed  with the findings in the report. No results found.   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  Patient currently on Acalabrutnib [AUG 16th, 2023]-  Patient admits to compliance; 09/04/2023- CT CAP:  Decreased size of lymph nodes above and below the diaphragm; No splenomegaly. Awaiting Neck CT today-Continue Acalbrutinib 100 mg BID.   # Clinically stable; no evidence of any progression -continue monitoring every 2 month.   # Neck pain/Joint pains/ Muscle  pain- s/p evluation with  Dr.Chasnis/ Dr.Brandon Katrinka Blazing; [facility]. S/p  surgery [AUG 2024]- stable.   # Mild to moderate anemia:[>>> Colo] Continue gentle iron/slow iron.Hb- 12.3- improving-     # Low immunoglobulins IgG 330/Hx  severe respiratory infection/CLL-s/pI IVIG #4/4 [last April 2022].MARCH 2024-- IgG > 1500- stable; will repeat Quant IgGs- ordered. .  # COPD-chronic cough sputum.  No clinical evidence of any acute exacerbation at this time.  Awaiting evaluation with pulmonary/Lebuar next week.   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 Aortic Atherosclerosis   reviewed/discussed/counseled the patient.   DISPOSITION: # follow up in 2 month-  MD; labs- cbc/cmp; LDH; B12;iron studies; ferritin; retic count;  Dr.B  # I reviewed the blood work- with the patient in detail; also reviewed the imaging independently [as summarized above]; and with the patient in detail.      Orders Placed This Encounter  Procedures   CBC with Differential (Cancer Center Only)    Standing Status:   Future    Expected Date:   11/13/2023    Expiration Date:   09/14/2024   CMP  (Cancer Center only)    Standing Status:   Future    Expected Date:   11/13/2023    Expiration Date:   09/14/2024   Lactate dehydrogenase    Standing Status:   Future    Expected Date:   11/13/2023    Expiration Date:   09/14/2024   Vitamin B12    Standing Status:   Future    Expected Date:   11/13/2023    Expiration Date:   09/14/2024   Iron and TIBC    Standing Status:   Future    Expected Date:   11/13/2023    Expiration Date:   09/14/2024   Ferritin    Standing Status:   Future    Expected Date:   11/13/2023    Expiration Date:   09/14/2024   Reticulocytes    Standing Status:   Future    Expected Date:   11/13/2023    Expiration Date:   09/14/2024   Immunoglobulins, QN, A/E/G/M    Standing Status:   Future    Expected Date:   11/13/2023    Expiration Date:   09/14/2024   All questions were answered. The patient knows to call the clinic with any problems, questions or concerns.     Earna Coder, MD 09/15/2023 12:19 PM

## 2023-09-15 NOTE — Assessment & Plan Note (Addendum)
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  Patient currently on Acalabrutnib [AUG 16th, 2023]-  Patient admits to compliance; 09/04/2023- CT CAP:  Decreased size of lymph nodes above and below the diaphragm; No splenomegaly. Awaiting Neck CT today-Continue Acalbrutinib 100 mg BID.   # Clinically stable; no evidence of any progression -continue monitoring every 2 month.   # Neck pain/Joint pains/ Muscle pain- s/p evluation with  Dr.Chasnis/ Dr.Brandon Katrinka Blazing; [facility]. S/p  surgery [AUG 2024]- stable.   # Mild to moderate anemia:[>>> Colo] Continue gentle iron/slow iron.Hb- 12.3- improving-     # Low immunoglobulins IgG 330/Hx  severe respiratory infection/CLL-s/pI IVIG #4/4 [last April 2022].MARCH 2024-- IgG > 1500- stable; will repeat Quant IgGs- ordered. .  # COPD-chronic cough sputum.  No clinical evidence of any acute exacerbation at this time.  Awaiting evaluation with pulmonary/Lebuar next week.   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 Aortic Atherosclerosis   reviewed/discussed/counseled the patient.   DISPOSITION: # follow up in 2 month-  MD; labs- cbc/cmp; LDH; B12;iron studies; ferritin; retic count;  Dr.B  # I reviewed the blood work- with the patient in detail; also reviewed the imaging independently [as summarized above]; and with the patient in detail.

## 2023-09-16 ENCOUNTER — Encounter: Payer: Self-pay | Admitting: Internal Medicine

## 2023-09-17 ENCOUNTER — Encounter: Payer: Self-pay | Admitting: Internal Medicine

## 2023-09-18 ENCOUNTER — Ambulatory Visit (INDEPENDENT_AMBULATORY_CARE_PROVIDER_SITE_OTHER): Payer: No Typology Code available for payment source | Admitting: *Deleted

## 2023-09-18 VITALS — BP 131/64 | HR 64 | Ht 68.0 in | Wt 195.0 lb

## 2023-09-18 DIAGNOSIS — Z0000 Encounter for general adult medical examination without abnormal findings: Secondary | ICD-10-CM | POA: Diagnosis not present

## 2023-09-18 NOTE — Progress Notes (Signed)
 Subjective:   [name] is a 78 y.o. who presents for a Medicare Wellness preventive visit.  Visit Complete: Virtual I connected with  [name] on 09/18/23 by a audio enabled telemedicine application and verified that I am speaking with the correct person using two identifiers.  Patient Location: Home  Provider Location: Home Office  I discussed the limitations of evaluation and management by telemedicine. The patient expressed understanding and agreed to proceed.  Vital Signs: Because this visit was a virtual/telehealth visit, some criteria may be missing or patient reported. Any vitals not documented were not able to be obtained and vitals that have been documented are patient reported.  VideoDeclined- This patient declined Librarian, academic. Therefore the visit was completed with audio only.  AWV Questionnaire: No: Patient Medicare AWV questionnaire was not completed prior to this visit.  Cardiac Risk Factors include: advanced age (>103men, >39 women);male gender;dyslipidemia;hypertension;Other (see comment), Risk factor comments: CAD     Objective:    Today's Vitals   09/18/23 1053 09/18/23 1056  BP:  131/64  Pulse: 64   SpO2: 98%   Weight: 195 lb (88.5 kg)   Height: 5\' 8"  (1.727 m)   PainSc:  5    Body mass index is 29.65 kg/m.     09/15/2023   10:31 AM 07/07/2023   10:01 AM 05/07/2023    9:57 AM 04/27/2023   10:59 AM 04/27/2023   10:07 AM 04/21/2023    9:08 AM 04/08/2023   10:47 AM  Advanced Directives  Does Patient Have a Medical Advance Directive? Yes Yes Yes Unable to assess, patient is non-responsive or altered mental status Yes Yes No  Type of Advance Directive Healthcare Power of [place];Living will Healthcare Power of [place];Living will Healthcare Power of Secretary;Living will  Healthcare Power of [place];Living will Living will;Healthcare Power of Attorney   Does patient want to make changes to medical advance  directive?   No - Patient declined   No - Patient declined   Copy of Healthcare Power of Attorney in Chart?   Yes - validated most recent copy scanned in chart (See row information)      Would patient like information on creating a medical advance directive?   No - Patient declined        Current Medications (verified) Outpatient Encounter Medications as of 09/18/2023  Medication Sig   acalabrutinib maleate (CALQUENCE) 100 MG tablet Take 1 tablet (100 mg total) by mouth 2 (two) times daily.   acetaminophen (TYLENOL) 325 MG tablet Take 1-2 tablets (325-650 mg total) by mouth every 4 (four) hours as needed for mild pain.   albuterol (VENTOLIN HFA) 108 (90 Base) MCG/ACT inhaler Inhale 2 puffs into the lungs every 6 (six) hours as needed for wheezing or shortness of breath.   amiodarone (PACERONE) 200 MG tablet Take 1/2 tablet (100 mg) by mouth twice daily 5 days a week (Patient taking differently: Take 100 mg by mouth See admin instructions. Take 1/2 tablet (100 mg) by mouth twice daily-takes 5 days a week. Mon, Tues, Thurs, Fri, Sat)   Apple Cider Vinegar 500 MG TABS Take 1 tablet by mouth daily.   azelastine (ASTELIN) 0.1 % nasal spray Place 2 sprays into both nostrils 2 (two) times daily. Use in each nostril as directed   cetirizine (ZYRTEC) 10 MG tablet Take 10 mg by mouth daily as needed for allergies.    Cholecalciferol (VITAMIN D3) 250 MCG (10000 UT) capsule Take 10,000 Units by  mouth daily.   Coenzyme Q10 (COQ10) 200 MG CAPS Take 200 mg by mouth daily.   cyanocobalamin (VITAMIN B12) 1000 MCG tablet Take 1,000 mcg by mouth daily.   Evolocumab (REPATHA SURECLICK) 140 MG/ML SOAJ Inject 140 mg into the skin every 14 (fourteen) days.   ezetimibe (ZETIA) 10 MG tablet Take 1 tablet (10 mg total) by mouth daily. (Patient taking differently: Take 10 mg by mouth every morning.)   ferrous sulfate 325 (65 FE) MG EC tablet Take 325 mg by mouth daily.   fluticasone-salmeterol (WIXELA INHUB) 100-50 MCG/ACT  AEPB Inhale 2 puffs into the lungs 2 (two) times daily.   mirtazapine (REMERON) 15 MG tablet Take 15 mg by mouth at bedtime as needed (for panic associated with PTSD).   mometasone-formoterol (DULERA) 200-5 MCG/ACT AERO Inhale 2 puffs into the lungs 2 (two) times daily.   Multiple Vitamin (MULTIVITAMIN WITH MINERALS) TABS tablet Take 1 tablet by mouth daily.   pantoprazole (PROTONIX) 40 MG tablet TAKE 1 TABLET(40 MG) BY MOUTH DAILY   senna (SENOKOT) 8.6 MG TABS tablet Take 1 tablet (8.6 mg total) by mouth daily as needed for mild constipation.   tamsulosin (FLOMAX) 0.4 MG CAPS capsule TAKE 1 CAPSULE(0.4 MG) BY MOUTH DAILY   Tiotropium Bromide Monohydrate 2.5 MCG/ACT AERS Inhale 2 puffs into the lungs at bedtime. Spiriva   tiZANidine (ZANAFLEX) 4 MG capsule Take 1 capsule (4 mg total) by mouth 3 (three) times daily. This can make you sleepy.   traMADol (ULTRAM) 50 MG tablet Take 50 mg by mouth 2 (two) times daily.   Facility-Administered Encounter Medications as of 09/18/2023  Medication   potassium chloride 10 mEq in 100 mL IVPB    Allergies (verified) Atorvastatin and Augmentin [amoxicillin-pot clavulanate]   History: Past Medical History:  Diagnosis Date   Agent orange exposure    Anxiety    Arthritis    Atrial fibrillation (HCC) 2013   a.) Dx'd in 2013; recurrence 02/2014; b.) CHA2DS2-VASc = 7 (age x2, HFimpEF, HTN, CVA x 2, previous MI); c.) cardiac rate/rhythm maintained on oral amiodarone; chronically anticoagulated using apixaban   BPH (benign prostatic hyperplasia)    C. 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)    a.) MV 08/23/2015: EF 50%; b.) TTE 08/23/2015: EF 55-60%, G1DD; c.) TTE 11/02/2015: EF 55-60%, mild LA dil; d.) TTE 02/04/2020: EF 55-60%,  mild LVH, G1DD, PASP 44.3, mild-mod AoV sclerosis; e.) MV 12/25/2020: EF 45-54%; f.)  R/LHC 02/04/2021: mRA 13, mPA 27, mPCWP 22, LVEDP 23, Ao sat 99, PA sat 70, CO 6.5, CI 3.0; g.) TTE 02/19/2021: EF 55-60%, G1DD, mildly red RVSF, triv MR, mild-mod AoV scler   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 EF 55%, mild antlat HK;  c.) s/p R/LHC 02/04/2021: LM mild diff dzs, LAD 100p, LCX nl, RCA 50p, 95d, 99 side branch, RPAV fills via LCx. SVG->RPDA 100, SVG->OM1  100, LIMA->LAD patent. EF 35-45%.   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    Hyperlipidemia LDL goal <70    Hypertension    Hypokalemia    Ischemic cardiomyopathy    a.) MV 08/23/2015: EF 50%; b.) TTE 08/23/2015: EF 55-60%; c.) TTE 11/02/2015: EF 55-60%; d.) TTE 02/04/2020: EF 55-60%; e.) MV 12/25/2020: EF 45-54%; f.) R/LHC 02/04/2021: mRA 13, mPA 27, mPCWP 22, LVEDP 23, Ao sat 99, PA sat 70, CO 6.5, CI 3.0; g.) TTE 02/19/2021: EF 55-60%   Ischemic cerebrovascular accident (CVA) (HCC) 10/18/2021   Myocardial infarction (HCC) 2010   On amiodarone therapy    On apixaban therapy    OSA on CPAP    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   TIA (transient ischemic attack) 11/02/2015   Past Surgical History:  Procedure Laterality Date   ABDOMINAL HERNIA REPAIR     APPENDECTOMY  06/21/1985   CORONARY ARTERY BYPASS GRAFT N/A 05/09/2009   EP IMPLANTABLE DEVICE N/A 03/03/2016   Procedure: Pacemaker Implant;  Surgeon: Duke Salvia, MD;  Location[facility] INVASIVE CV LAB;  Service: Cardiovascular;  Laterality: N/A;   FOREIGN BODY REMOVAL  1968   "shrapnel in my tailbone"   INGUINAL HERNIA REPAIR Right    JOINT REPLACEMENT Right 2018   LAPAROSCOPIC CHOLECYSTECTOMY     LEFT HEART CATH AND CORONARY ANGIOGRAPHY Left 05/08/2009   Procedure: LEFT HEART CATH AND CORONARY ANGIOGRAPHY; Location: [facility]: Surgeon: Lorine Bears, MD   LEFT HEART CATH AND CORS/GRAFTS ANGIOGRAPHY Left 10/17/2009   Procedure: LEFT HEART CATH AND CORS/GRAFTS ANGIOGRAPHY; Location: [facility]; Surgeon: Lorine Bears, MD   RIGHT/LEFT HEART CATH AND CORONARY/GRAFT ANGIOGRAPHY N/A 02/04/2021   Procedure: RIGHT/LEFT HEART CATH AND CORONARY/GRAFT ANGIOGRAPHY;  Surgeon: Yvonne Kendall, MD;  Location: [facility] INVASIVE CV LAB;  Service: Cardiovascular;  Laterality: N/A;   TONSILLECTOMY AND ADENOIDECTOMY  1956   TOTAL HIP ARTHROPLASTY Right 10/22/2016   Procedure: TOTAL HIP ARTHROPLASTY;  Surgeon: Donato Heinz, MD;  Location: [facility] ORS;  Service: Orthopedics;  Laterality: Right;   TOTAL HIP ARTHROPLASTY Left 11/04/2017   Procedure: TOTAL HIP ARTHROPLASTY;  Surgeon: Donato Heinz, MD;  Location: [facility] ORS;  Service: Orthopedics;  Laterality: Left;   Family History  Problem Relation Age of Onset   Heart disease  Mother    Heart attack Mother    Coronary artery disease Other        family history   Social History   Socioeconomic History   Marital status: Married    Spouse name: Not on file   Number of children: Not on file   Years  of education: Not on file   Highest education level: Not on file  Occupational History   Occupation: retired    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 Dogs. Goats. Children - 4. Grandchildren - 7.      Worked - Software engineer, part time.   Diet - healthy   Exercise - very active      Service - [employer] - [place]   Social Drivers of Health   Financial Resource Strain: Low Risk  (09/18/2023)   Overall Financial Resource Strain (CARDIA)    Difficulty of Paying Living Expenses: Not hard at all  Food Insecurity: No Food Insecurity (09/18/2023)   Hunger Vital Sign    Worried About Running Out of Food in the Last Year: Never true    Ran Out of Food in the Last Year: Never true  Transportation Needs: No Transportation Needs (09/18/2023)   PRAPARE - Administrator, Civil Service (Medical): No    Lack of Transportation (Non-Medical): No  Physical Activity: Sufficiently Active (09/18/2023)   Exercise Vital Sign    Days of Exercise per Week: 5 days    Minutes of Exercise per Session: 40 min  Stress: No Stress Concern Present (09/18/2023)   [employer] of Occupational Health - Occupational Stress Questionnaire    Feeling of  Stress : Not at all  Social Connections: Moderately Isolated (09/18/2023)   Social Connection and Isolation Panel [NHANES]    Frequency of Communication with Friends and Family: More than three times a week    Frequency of Social Gatherings with Friends and Family: More than three times a week    Attends Religious Services: Never    Database administrator or Organizations: No    Attends Engineer, structural: Never    Marital Status: Married    Tobacco Counseling Counseling given: Not Answered    Clinical Intake:  Pre-visit preparation completed: Yes  Pain : 0-10 Pain Score: 5  Pain Type: Chronic pain (arthrits pain/joints) Pain Location: Other (Comment) Pain Descriptors / Indicators: Aching, Dull Pain Onset: More than a month ago Pain Frequency: Constant     BMI - recorded: 29.65 Nutritional Status: BMI 25 -29 Overweight Nutritional Risks: None Diabetes: No  How often do you need to have someone help you when you read instructions, pamphlets, or other written materials from your doctor or pharmacy?: 1 - Never  Interpreter Needed?: No  Information entered by :: R. Lorriann Hansmann LPN   Activities of Daily Living     09/18/2023   10:58 AM 04/21/2023    4:30 PM  In your present state of health, do you have any difficulty performing the following activities:  Hearing? 1 1  Comment wears auds wears bilateral hearing aids  Vision? 0 0  Difficulty concentrating or making decisions? 0 0  Walking or climbing stairs? 1 1  Dressing or bathing? 0 0  Doing errands, shopping? 0 0  Preparing Food and eating ? N   Using the Toilet? N   In the past six months, have you accidently leaked urine? N   Do you have problems with loss of bowel control? N   Managing your Medications? N   Managing your Finances? N   Housekeeping or managing your Housekeeping? N     Patient Care Team: Glori Luis, MD as PCP - General (Family Medicine) Antonieta Iba, MD as PCP - Cardiology  (Cardiology) Duke Salvia, MD as PCP - Electrophysiology (Cardiology) Antonieta Iba, MD as Consulting Physician (Cardiology) Earna Coder, MD as Medical Oncologist (Hematology and Oncology) Keitha Butte, RN as Registered Nurse (Oncology)  Indicate any recent Medical Services you may have received from other than [facility] providers in the past year (date may be approximate).     Assessment:   This is a routine wellness examination for [facility].  Hearing/Vision screen Hearing Screening - Comments:: Wears aids Vision Screening - Comments:: No glasses   Goals Addressed             This Visit's Progress    Patient Stated       Wants to continue to care for his animals and stay active       Depression Screen     09/18/2023   11:13 AM 07/10/2023   11:30 AM 04/10/2023   11:44 AM 01/09/2023    9:09 AM 10/07/2022    9:31 AM 09/12/2022    9:46 AM 07/08/2022    9:07 AM  PHQ 2/9 Scores  PHQ - 2 Score 0 0 1 0 0 0 0  PHQ- 9 Score 0 1 6 0 1  0    Fall Risk     09/18/2023   11:01 AM 07/10/2023   11:30 AM 04/10/2023   11:43 AM 01/09/2023    9:09 AM 10/07/2022    9:31 AM  Fall Risk   Falls in the past year? 1 1 0 0 1  Number falls in past yr: 1 1 0 0 1  Injury with Fall? 0 0 0 0 0  Risk for fall due to : History of fall(s);Impaired balance/gait History of fall(s) No Fall Risks No Fall Risks History of fall(s)  Follow up Falls evaluation completed;Falls prevention discussed Falls evaluation completed Falls evaluation completed Falls evaluation completed Falls evaluation completed    MEDICARE RISK AT HOME:  Medicare Risk at Home Any stairs in or around the home?: Yes If so, are there any without handrails?: No Home free of loose throw rugs in walkways, pet beds, electrical cords, etc?: Yes Adequate lighting in your home to reduce risk of falls?: Yes Life alert?: No Use of a cane, walker or w/c?: Yes Grab bars in the bathroom?: No Shower chair or bench in shower?:  Yes Elevated toilet seat or a handicapped toilet?: Yes  TIMED UP AND GO:  Was the test performed?  No  Cognitive Function: 6CIT completed        09/18/2023   11:19 AM 09/12/2022    9:51 AM 05/12/2019   10:50 AM 03/19/2018    8:50 AM  6CIT Screen  What Year? 0 points 0 points 0 points 0 points  What month? 0 points 0 points 0 points 0 points  What time? 0 points 0 points 0 points 0 points  Count back from 20 0 points 0 points 0 points 0 points  Months  in reverse 4 points 0 points 0 points 0 points  Repeat phrase 2 points 0 points 0 points 0 points  Total Score 6 points 0 points 0 points 0 points    Immunizations Immunization History  Administered Date(s) Administered   Fluad Quad(high Dose 65+) 04/04/2020, 05/02/2021, 04/07/2022   Fluad Trivalent(High Dose 65+) 04/15/2018, 04/10/2023   Hep B, Unspecified 01/09/2003, 05/16/2003   Hepatitis B, ADULT 03/14/2004   Influenza Split 05/21/2012   Influenza, High Dose Seasonal PF 04/21/2018   Influenza, Seasonal, Injecte, Preservative Fre 04/23/2011, 03/31/2012, 04/06/2013   Influenza,inj,Quad PF,6+ Mos 03/16/2014, 06/20/2015, 04/27/2016, 04/17/2017   Influenza-Unspecified 08/06/2015, 05/12/2016, 04/15/2018   PFIZER(Purple Top)SARS-COV-2 Vaccination 08/22/2019, 09/15/2019, 10/11/2019, 04/17/2020   Pfizer Covid-19 Vaccine Bivalent Booster 27yrs & up 05/17/2021   Pfizer(Comirnaty)Fall Seasonal Vaccine 12 years and older 08/25/2022   Pneumococcal Conjugate-13 04/07/2014, 07/05/2015   Pneumococcal Polysaccharide-23 07/21/2008, 01/10/2009, 01/07/2011, 04/21/2015, 08/06/2015   Rsv, Bivalent, Protein Subunit Rsvpref,pf Verdis Frederickson) 02/23/2023   Tdap 03/28/2013, 01/14/2018, 05/07/2021   Zoster Recombinant(Shingrix) 02/24/2022, 08/25/2022    Screening Tests Health Maintenance  Topic Date Due   COVID-19 Vaccine (7 - 2024-25 season) 03/22/2023   Medicare Annual Wellness (AWV)  09/17/2024   DTaP/Tdap/Td (4 - Td or Tdap) 05/08/2031    Pneumonia Vaccine 1+ Years old  Completed   INFLUENZA VACCINE  Completed   Hepatitis C Screening  Completed   Zoster Vaccines- Shingrix  Completed   HPV VACCINES  Aged Out   Lung Cancer Screening  Discontinued   Colonoscopy  Discontinued    Health Maintenance  Health Maintenance Due  Topic Date Due   COVID-19 Vaccine (7 - 2024-25 season) 03/22/2023   Health Maintenance Items Addressed: See Nurse Notes  Additional Screening:  Vision Screening: Recommended annual ophthalmology exams for early detection of glaucoma and other disorders of the eye. Overdue. Patient will call and schedule an eye appointment at the [facility]  Dental Screening: Recommended annual dental exams for proper oral hygiene  Community Resource Referral / Chronic Care Management: CRR required this visit?  No   CCM required this visit?  No     Plan:     I have personally reviewed and noted the following in the patient's chart:   Medical and social history Use of alcohol, tobacco or illicit drugs  Current medications and supplements including opioid prescriptions. Patient is currently taking opioid prescriptions. Information provided to patient regarding non-opioid alternatives. Patient advised to discuss non-opioid treatment plan with their provider. Functional ability and status Nutritional status Physical activity Advanced directives List of other physicians Hospitalizations, surgeries, and ER visits in previous 12 months Vitals Screenings to include cognitive, depression, and falls Referrals and appointments  In addition, I have reviewed and discussed with patient certain preventive protocols, quality metrics, and best practice recommendations. A written personalized care plan for preventive services as well as general preventive health recommendations were provided to patient.     Sydell Axon, LPN   01/08/3085   After Visit Summary: (MyChart) Due to this being a telephonic visit, the after visit  summary with patients personalized plan was offered to patient via MyChart   Notes: Nothing significant to report at this time.

## 2023-09-18 NOTE — Patient Instructions (Signed)
 [name] , Thank you for taking time to come for your Medicare Wellness Visit. I appreciate your ongoing commitment to your health goals. Please review the following plan we discussed and let me know if I can assist you in the future.   Referrals/Orders/Follow-Ups/Clinician Recommendation: None  This is a list of the screening recommended for you and due dates:  Health Maintenance  Topic Date Due   COVID-19 Vaccine (7 - 2024-25 season) 03/22/2023   Medicare Annual Wellness Visit  09/17/2024   DTaP/Tdap/Td vaccine (4 - Td or Tdap) 05/08/2031   Pneumonia Vaccine  Completed   Flu Shot  Completed   Hepatitis C Screening  Completed   Zoster (Shingles) Vaccine  Completed   HPV Vaccine  Aged Out   Screening for Lung Cancer  Discontinued   Colon Cancer Screening  Discontinued    Advanced directives: (In Chart) A copy of your advanced directives are scanned into your chart should your provider ever need it.  Next Medicare Annual Wellness Visit scheduled for next year: Yes 09/19/24 #@ 10:50  Managing Pain Without Opioids Opioids are strong medicines used to treat moderate to severe pain. For some people, especially those who have long-term (chronic) pain, opioids may not be the best choice for pain management due to: Side effects like nausea, constipation, and sleepiness. The risk of addiction (opioid use disorder). The longer you take opioids, the greater your risk of addiction. Pain that lasts for more than 3 months is called chronic pain. Managing chronic pain usually requires more than one approach and is often provided by a team of health care providers working together (multidisciplinary approach). Pain management may be done at a pain management center or pain clinic. How to manage pain without the use of opioids Use non-opioid medicines Non-opioid medicines for pain may include: Over-the-counter or prescription non-steroidal anti-inflammatory drugs (NSAIDs). These may be the first  medicines used for pain. They work well for muscle and bone pain, and they reduce swelling. Acetaminophen. This over-the-counter medicine may work well for milder pain but not swelling. Antidepressants. These may be used to treat chronic pain. A certain type of antidepressant (tricyclics) is often used. These medicines are given in lower doses for pain than when used for depression. Anticonvulsants. These are usually used to treat seizures but may also reduce nerve (neuropathic) pain. Muscle relaxants. These relieve pain caused by sudden muscle tightening (spasms). You may also use a pain medicine that is applied to the skin as a patch, cream, or gel (topical analgesic), such as a numbing medicine. These may cause fewer side effects than medicines taken by mouth. Do certain therapies as directed Some therapies can help with pain management. They include: Physical therapy. You will do exercises to gain strength and flexibility. A physical therapist may teach you exercises to move and stretch parts of your body that are weak, stiff, or painful. You can learn these exercises at physical therapy visits and practice them at home. Physical therapy may also involve: Massage. Heat wraps or applying heat or cold to affected areas. Electrical signals that interrupt pain signals (transcutaneous electrical nerve stimulation, TENS). Weak lasers that reduce pain and swelling (low-level laser therapy). Signals from your body that help you learn to regulate pain (biofeedback). Occupational therapy. This helps you to learn ways to function at home and work with less pain. Recreational therapy. This involves trying new activities or hobbies, such as a physical activity or drawing. Mental health therapy, including: Cognitive behavioral therapy (CBT). This  helps you learn coping skills for dealing with pain. Acceptance and commitment therapy (ACT) to change the way you think and react to pain. Relaxation therapies,  including muscle relaxation exercises and mindfulness-based stress reduction. Pain management counseling. This may be individual, family, or group counseling.  Receive medical treatments Medical treatments for pain management include: Nerve block injections. These may include a pain blocker and anti-inflammatory medicines. You may have injections: Near the spine to relieve chronic back or neck pain. Into joints to relieve back or joint pain. Into nerve areas that supply a painful area to relieve body pain. Into muscles (trigger point injections) to relieve some painful muscle conditions. A medical device placed near your spine to help block pain signals and relieve nerve pain or chronic back pain (spinal cord stimulation device). Acupuncture. Follow these instructions at home Medicines Take over-the-counter and prescription medicines only as told by your health care provider. If you are taking pain medicine, ask your health care providers about possible side effects to watch out for. Do not drive or use heavy machinery while taking prescription opioid pain medicine. Lifestyle  Do not use drugs or alcohol to reduce pain. If you drink alcohol, limit how much you have to: 0-1 drink a day for women who are not pregnant. 0-2 drinks a day for men. Know how much alcohol is in a drink. In the [place] one drink equals one 12 oz bottle of beer (355 mL), one 5 oz glass of wine (148 mL), or one 1 oz glass of hard liquor (44 mL). Do not use any products that contain nicotine or tobacco. These products include cigarettes, chewing tobacco, and vaping devices, such as e-cigarettes. If you need help quitting, ask your health care provider. Eat a healthy diet and maintain a healthy weight. Poor diet and excess weight may make pain worse. Eat foods that are high in fiber. These include fresh fruits and vegetables, whole grains, and beans. Limit foods that are high in fat and processed sugars, such as fried and  sweet foods. Exercise regularly. Exercise lowers stress and may help relieve pain. Ask your health care provider what activities and exercises are safe for you. If your health care provider approves, join an exercise class that combines movement and stress reduction. Examples include yoga and tai chi. Get enough sleep. Lack of sleep may make pain worse. Lower stress as much as possible. Practice stress reduction techniques as told by your therapist. General instructions Work with all your pain management providers to find the treatments that work best for you. You are an important member of your pain management team. There are many things you can do to reduce pain on your own. Consider joining an online or in-person support group for people who have chronic pain. Keep all follow-up visits. This is important. Where to find more information You can find more information about managing pain without opioids from: American Academy of Pain Medicine: painmed.org Institute for Chronic Pain: instituteforchronicpain.org American Chronic Pain Association: theacpa.org Contact a health care provider if: You have side effects from pain medicine. Your pain gets worse or does not get better with treatments or home therapy. You are struggling with anxiety or depression. Summary Many types of pain can be managed without opioids. Chronic pain may respond better to pain management without opioids. Pain is best managed when you and a team of health care providers work together. Pain management without opioids may include non-opioid medicines, medical treatments, physical therapy, mental health therapy, and lifestyle  changes. Tell your health care providers if your pain gets worse or is not being managed well enough. This information is not intended to replace advice given to you by your health care provider. Make sure you discuss any questions you have with your health care provider. Document Revised: 10/17/2020  Document Reviewed: 10/17/2020  Elsevier Patient Education  2024 [employer].

## 2023-09-22 ENCOUNTER — Other Ambulatory Visit: Payer: Self-pay | Admitting: Internal Medicine

## 2023-09-24 ENCOUNTER — Ambulatory Visit

## 2023-09-24 ENCOUNTER — Encounter: Payer: Self-pay | Admitting: Internal Medicine

## 2023-09-24 ENCOUNTER — Ambulatory Visit: Payer: Self-pay | Admitting: Family Medicine

## 2023-09-24 ENCOUNTER — Ambulatory Visit
Admission: EM | Admit: 2023-09-24 | Discharge: 2023-09-24 | Disposition: A | Discharge status: Home / Self Care | Attending: Physician Assistant | Admitting: Physician Assistant

## 2023-09-24 DIAGNOSIS — R0602 Shortness of breath: Secondary | ICD-10-CM | POA: Diagnosis not present

## 2023-09-24 DIAGNOSIS — J441 Chronic obstructive pulmonary disease with (acute) exacerbation: Secondary | ICD-10-CM

## 2023-09-24 DIAGNOSIS — R509 Fever, unspecified: Secondary | ICD-10-CM | POA: Diagnosis not present

## 2023-09-24 DIAGNOSIS — J189 Pneumonia, unspecified organism: Secondary | ICD-10-CM | POA: Diagnosis not present

## 2023-09-24 DIAGNOSIS — I10 Essential (primary) hypertension: Secondary | ICD-10-CM

## 2023-09-24 MED ORDER — IPRATROPIUM-ALBUTEROL 0.5-2.5 (3) MG/3ML IN SOLN
3.0000 mL | Freq: Once | RESPIRATORY_TRACT | Status: AC
  Administered 2023-09-24: 3 mL via RESPIRATORY_TRACT

## 2023-09-24 MED ORDER — PROMETHAZINE-DM 6.25-15 MG/5ML PO SYRP: 5.0000 mL | ORAL_SOLUTION | Freq: Four times a day (QID) | ORAL | 0 refills | Status: DC | PRN

## 2023-09-24 MED ORDER — DOXYCYCLINE HYCLATE 100 MG PO CAPS: 100.0000 mg | ORAL_CAPSULE | Freq: Two times a day (BID) | ORAL | 0 refills | Status: AC

## 2023-09-24 MED ORDER — CEFDINIR 300 MG PO CAPS: 300.0000 mg | ORAL_CAPSULE | Freq: Two times a day (BID) | ORAL | 0 refills | Status: AC

## 2023-09-24 MED ORDER — DEXAMETHASONE SODIUM PHOSPHATE 10 MG/ML IJ SOLN
10.0000 mg | Freq: Once | INTRAMUSCULAR | Status: AC
  Administered 2023-09-24: 10 mg via INTRAMUSCULAR

## 2023-09-24 MED ORDER — PREDNISONE 20 MG PO TABS: 40.0000 mg | ORAL_TABLET | Freq: Every day | ORAL | 0 refills | Status: AC

## 2023-09-24 MED ORDER — IPRATROPIUM-ALBUTEROL 0.5-2.5 (3) MG/3ML IN SOLN: 3.0000 mL | Freq: Four times a day (QID) | RESPIRATORY_TRACT | 0 refills | Status: AC | PRN

## 2023-09-24 MED ORDER — CEFTRIAXONE SODIUM 1 G IJ SOLR
1.0000 g | INTRAMUSCULAR | Status: DC
  Administered 2023-09-24: 1 g via INTRAMUSCULAR

## 2023-09-24 NOTE — ED Provider Notes (Signed)
 [facility]    CSN: [id number] Arrival date & time: 09/24/23  1410      History   Chief Complaint Chief Complaint  Patient presents with   Cough   Shortness of Breath   Wheezing    HPI [name] is a 78 y.o. male with history of COPD, chronic lymphocytic leukemia, atrial fibrillation, hypertension, hyperlipidemia, PTSD, chronic diastolic CHF, sick sinus syndrome, peripheral artery disease, obstructive sleep apnea, CAD, depression, cardiomyopathy, CVA, anemia, anxiety, GERD, and BPH.  Patient presents with his wife today for 5-day history of fatigue, productive cough, congestion, shortness of breath.  Reports low-grade fever.  Current temperature 100.3 degrees.  Has had increased shortness of breath from baseline.  Has been using inhalers.  Currently using Wixela, Spiriva and Dulera.  States he does not have a nebulizer at home.  Denies any sick contacts or known exposure to COVID or flu.  His wife is concerned for possible pneumonia.  No other complaints.  HPI  Past Medical History:  Diagnosis Date   Agent orange exposure    Anxiety    Arthritis    Atrial fibrillation (HCC) 2013   a.) Dx'd in 2013; recurrence 02/2014; b.) CHA2DS2-VASc = 7 (age x2, HFimpEF, HTN, CVA x 2, previous MI); c.) cardiac rate/rhythm maintained on oral amiodarone; chronically anticoagulated using apixaban   BPH (benign prostatic hyperplasia)    C. 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) (HCC)    a.) MV 08/23/2015: EF 50%; b.) TTE 08/23/2015: EF 55-60%, G1DD; c.) TTE 11/02/2015: EF 55-60%, mild LA dil; d.) TTE 02/04/2020: EF 55-60%, mild LVH, G1DD, PASP 44.3, mild-mod AoV sclerosis; e.) MV 12/25/2020: EF 45-54%; f.)  R/LHC 02/04/2021: mRA 13, mPA 27, mPCWP 22, LVEDP  23, Ao sat 99, PA sat 70, CO 6.5, CI 3.0; g.) TTE 02/19/2021: EF 55-60%, G1DD, mildly red RVSF, triv MR, mild-mod AoV scler   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 EF 55%, mild antlat HK;  c.) s/p R/LHC 02/04/2021: LM mild diff dzs, LAD 100p, LCX nl, RCA 50p, 95d, 99 side branch, RPAV fills via LCx. SVG->RPDA 100, SVG->OM1 100, LIMA->LAD patent. EF 35-45%.   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    Hyperlipidemia LDL goal <70    Hypertension    Hypokalemia    Ischemic cardiomyopathy    a.) MV 08/23/2015: EF 50%; b.) TTE 08/23/2015: EF 55-60%; c.) TTE 11/02/2015: EF 55-60%; d.) TTE 02/04/2020: EF 55-60%; e.) MV 12/25/2020: EF 45-54%; f.) R/LHC 02/04/2021: mRA 13, mPA 27, mPCWP 22, LVEDP 23, Ao sat 99, PA sat 70, CO 6.5, CI 3.0; g.) TTE 02/19/2021: EF 55-60%  Ischemic cerebrovascular accident (CVA) (HCC) 10/18/2021   Myocardial infarction (HCC) 2010   On amiodarone therapy    On apixaban therapy    OSA on CPAP    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   a.) LIMA-LAD, SVG-PDA, SVG-OM1   Sinus pause    a.) Zio patch study 08/27/2015 - 09/25/2015  --> episodes of 3 second pauses noted while in both NSR and A.fib   SSS (sick sinus syndrome) (HCC)    a.) s/p MDT Advisa MRI SureScan PPM (SN#: [id number]) placement  on 03/03/2016   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   TIA (transient ischemic attack) 11/02/2015    Patient Active Problem List   Diagnosis Date Noted   Head injury 07/10/2023   Spondylosis, cervical, with myelopathy 04/21/2023   Cervical myelopathy (HCC) 04/21/2023   Campylobacter enteritis 04/02/2023   Class 1 obesity due to excess calories with body mass index (BMI) of 30.0 to 30.9 in adult 04/01/2023   Hypotension 03/31/2023   Campylobacter gastroenteritis 03/31/2023   Respiratory tract infection due to COVID-19 virus 03/10/2023   Spinal stenosis 01/09/2023   Dizziness 01/09/2023   BPH (benign prostatic hyperplasia) 01/09/2023   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   Sensorineural hearing loss, 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   (HFpEF) heart failure with preserved ejection fraction (HCC) 06/30/2022   Abnormal INR 06/30/2022   Anxiety 06/30/2022   GERD (gastroesophageal reflux disease) 06/30/2022   History of CVA (cerebrovascular accident) 06/30/2022   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/30/2022   Anemia 06/02/2022   Dysphagia 01/03/2022   Lymphadenopathy 01/03/2022   Chronic neck pain 01/03/2022   Hearing loss 12/23/2021   Personal history of tobacco use, presenting hazards to health 12/23/2021   Polyp of colon 12/23/2021   Neck pain 12/23/2021   Neck nodule 11/11/2021   CVA (cerebral vascular accident) (HCC) 10/18/2021   AKI (acute kidney injury) (HCC) 09/13/2021   Weakness 09/04/2021   COVID-19 virus infection 09/04/2021   Accelerating angina (HCC) 02/04/2021   Abnormal stress test    Cardiomyopathy (HCC)    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   CAD (coronary artery disease)    Depression    Arthritis 09/16/2019   Dehydration 03/14/2019    Sick sinus syndrome (HCC) 07/08/2016   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   Thrombocytopenia (HCC) 02/29/2016   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    PAD (peripheral artery disease) (HCC)    OSA on CPAP    Chronic diastolic CHF (congestive heart failure) (HCC) 09/20/2015   CLL (chronic lymphocytic leukemia) (HCC)    PTSD (post-traumatic stress disorder)    Osteoarthritis of both knees 07/05/2015   COPD (chronic obstructive pulmonary disease) (HCC) 01/01/2012   Paroxysmal atrial fibrillation (HCC) 10/09/2011   Hyperlipidemia 11/28/2009   Essential hypertension 11/28/2009    Past Surgical History:  Procedure Laterality Date   ABDOMINAL HERNIA REPAIR     APPENDECTOMY  06/21/1985   CORONARY ARTERY BYPASS GRAFT N/A 05/09/2009   EP IMPLANTABLE DEVICE N/A 03/03/2016   Procedure: Pacemaker Implant;  Surgeon: Duke Salvia, MD;  Location: [facility] INVASIVE CV LAB;  Service: Cardiovascular;  Laterality: N/A;   FOREIGN BODY REMOVAL  1968   "  shrapnel in my tailbone"   INGUINAL HERNIA REPAIR Right    JOINT REPLACEMENT Right 2018   LAPAROSCOPIC CHOLECYSTECTOMY     LEFT HEART CATH AND CORONARY ANGIOGRAPHY Left 05/08/2009   Procedure: LEFT HEART CATH AND CORONARY ANGIOGRAPHY; Location: [facility]: Surgeon: Lorine Bears, MD   LEFT HEART CATH AND CORS/GRAFTS ANGIOGRAPHY Left 10/17/2009   Procedure: LEFT HEART CATH AND CORS/GRAFTS ANGIOGRAPHY; Location: [facility]; Surgeon: Lorine Bears, MD   RIGHT/LEFT HEART CATH AND CORONARY/GRAFT ANGIOGRAPHY N/A 02/04/2021   Procedure: RIGHT/LEFT HEART CATH AND CORONARY/GRAFT ANGIOGRAPHY;  Surgeon: Yvonne Kendall, MD;  Location: [facility] INVASIVE CV LAB;  Service: Cardiovascular;  Laterality: N/A;   TONSILLECTOMY AND ADENOIDECTOMY  1956   TOTAL HIP ARTHROPLASTY Right 10/22/2016   Procedure: TOTAL HIP ARTHROPLASTY;  Surgeon: Donato Heinz, MD;  Location: [facility] ORS;  Service: Orthopedics;  Laterality: Right;   TOTAL HIP ARTHROPLASTY  Left 11/04/2017   Procedure: TOTAL HIP ARTHROPLASTY;  Surgeon: Donato Heinz, MD;  Location: [facility] ORS;  Service: Orthopedics;  Laterality: Left;       Home Medications    Prior to Admission medications   Medication Sig Start Date End Date Taking? Authorizing Provider  acalabrutinib maleate (CALQUENCE) 100 MG tablet Take 1 tablet (100 mg total) by mouth 2 (two) times daily. 09/03/23  Yes Earna Coder, MD  acetaminophen (TYLENOL) 325 MG tablet Take 1-2 tablets (325-650 mg total) by mouth every 4 (four) hours as needed for mild pain. 11/01/21  Yes Setzer, Lynnell Jude, PA-C  albuterol (VENTOLIN HFA) 108 (90 Base) MCG/ACT inhaler Inhale 2 puffs into the lungs every 6 (six) hours as needed for wheezing or shortness of breath. 11/01/21  Yes Setzer, Lynnell Jude, PA-C  Apple Cider Vinegar 500 MG TABS Take 1 tablet by mouth daily.   Yes [provider]  azelastine (ASTELIN) 0.1 % nasal spray Place 2 sprays into both nostrils 2 (two) times daily. Use in each nostril as directed 03/10/23  Yes Bethanie Dicker, NP  cefdinir (OMNICEF) 300 MG capsule Take 1 capsule (300 mg total) by mouth 2 (two) times daily for 7 days. 09/24/23 10/01/23 Yes Shirlee Latch, PA-C  cetirizine (ZYRTEC) 10 MG tablet Take 10 mg by mouth daily as needed for allergies.    Yes [provider]  Cholecalciferol (VITAMIN D3) 250 MCG (10000 UT) capsule Take 10,000 Units by mouth daily.   Yes [provider]  Coenzyme Q10 (COQ10) 200 MG CAPS Take 200 mg by mouth daily.   Yes [provider]  cyanocobalamin (VITAMIN B12) 1000 MCG tablet Take 1,000 mcg by mouth daily.   Yes [provider]  doxycycline (VIBRAMYCIN) 100 MG capsule Take 1 capsule (100 mg total) by mouth 2 (two) times daily for 7 days. 09/24/23 10/01/23 Yes Shirlee Latch, PA-C  Evolocumab (REPATHA SURECLICK) 140 MG/ML SOAJ Inject 140 mg into the skin every 14 (fourteen) days. 10/10/22  Yes Antonieta Iba, MD  ezetimibe (ZETIA) 10 MG  tablet Take 1 tablet (10 mg total) by mouth daily. Patient taking differently: Take 10 mg by mouth every morning. 01/06/17 11/01/27 Yes Gollan, Tollie Pizza, MD  ferrous sulfate 325 (65 FE) MG EC tablet Take 325 mg by mouth daily.   Yes [provider]  fluticasone-salmeterol (WIXELA INHUB) 100-50 MCG/ACT AEPB Inhale 2 puffs into the lungs 2 (two) times daily.   Yes [provider]  ipratropium-albuterol (DUONEB) 0.5-2.5 (3) MG/3ML SOLN Take 3 mLs by nebulization every 6 (six) hours as needed (shorntess of breath). 09/24/23  Yes Eusebio Friendly B, PA-C  mirtazapine (REMERON) 15 MG tablet Take 15 mg by mouth at bedtime as needed (for panic associated with PTSD).   Yes [provider]  mometasone-formoterol (DULERA) 200-5 MCG/ACT AERO Inhale 2 puffs into the lungs 2 (two) times daily.   Yes [provider]  Multiple Vitamin (MULTIVITAMIN WITH MINERALS) TABS tablet Take 1 tablet by mouth daily.   Yes [provider]  pantoprazole (PROTONIX) 40 MG tablet TAKE 1 TABLET(40 MG) BY MOUTH DAILY 04/15/23  Yes Glori Luis, MD  predniSONE (DELTASONE) 20 MG tablet Take 2 tablets (40 mg total) by mouth daily for 5 days. 09/24/23 09/29/23 Yes Shirlee Latch, PA-C  promethazine-dextromethorphan (PROMETHAZINE-DM) 6.25-15 MG/5ML syrup Take 5 mLs by mouth 4 (four) times daily as needed. 09/24/23  Yes Shirlee Latch, PA-C  senna (SENOKOT) 8.6 MG TABS tablet Take 1 tablet (8.6 mg total) by mouth daily as needed for mild constipation. 04/24/23  Yes Susanne Borders, PA  tamsulosin (FLOMAX) 0.4 MG CAPS capsule TAKE 1 CAPSULE(0.4 MG) BY MOUTH DAILY 05/12/23  Yes Glori Luis, MD  Tiotropium Bromide Monohydrate 2.5 MCG/ACT AERS Inhale 2 puffs into the lungs at bedtime. Spiriva 08/07/20  Yes [provider]  tiZANidine (ZANAFLEX) 4 MG capsule Take 1 capsule (4 mg total) by mouth 3 (three) times daily. This can make you sleepy. 04/24/23  Yes Drake Leach, PA-C  traMADol  (ULTRAM) 50 MG tablet Take 50 mg by mouth 2 (two) times daily.   Yes [provider]  amiodarone (PACERONE) 200 MG tablet TAKE 1/2 TABLET BY MOUTH TWICE DAILY FOR 5 DAYS A WEEK 09/24/23   Duke Salvia, MD    Family History Family History  Problem Relation Age of Onset   Heart disease Mother    Heart attack Mother    Coronary artery disease Other        family history    Social History Social History   Tobacco Use   Smoking status: Former    Current packs/day: 0.00    Average packs/day: 1 pack/day for 40.0 years (40.0 ttl pk-yrs)    Types: Cigarettes    Start date: 07/21/1966    Quit date: 07/21/2006    Years since quitting: 17.1   Smokeless tobacco: Never  Vaping Use   Vaping status: Former  Substance Use Topics   Alcohol use: Not Currently   Drug use: No     Allergies   Atorvastatin and Augmentin [amoxicillin-pot clavulanate]   Review of Systems Review of Systems  Constitutional:  Positive for fatigue and fever.  HENT:  Positive for congestion. Negative for rhinorrhea, sinus pressure, sinus pain and sore throat.   Respiratory:  Positive for cough, shortness of breath and wheezing.   Cardiovascular:  Negative for chest pain.  Gastrointestinal:  Negative for abdominal pain, diarrhea, nausea and vomiting.  Musculoskeletal:  Negative for myalgias.  Neurological:  Negative for weakness, light-headedness and headaches.  Hematological:  Negative for adenopathy.     Physical Exam Triage Vital Signs ED Triage Vitals  Encounter Vitals Group     BP 09/24/23 1434 (!) 183/71     Systolic BP Percentile --      Diastolic BP Percentile --      Pulse Rate 09/24/23 1434 78     Resp 09/24/23 1434 20     Temp 09/24/23 1434 100.3 F (37.9 C)     Temp Source 09/24/23 1434 Oral     SpO2 09/24/23 1434 93 %  Weight 09/24/23 1433 195 lb (88.5 kg)     Height 09/24/23 1433 5\' 8"  (1.727 m)     Head Circumference --      Peak Flow --      Pain Score 09/24/23 1436 9      Pain Loc --      Pain Education --      Exclude from Growth Chart --    No data found.  Updated Vital Signs BP (!) 183/71 (BP Location: Right Arm)   Pulse 78   Temp 100.3 F (37.9 C) (Oral)   Resp 20   Ht 5\' 8"  (1.727 m)   Wt 195 lb (88.5 kg)   SpO2 93%   BMI 29.65 kg/m       Physical Exam Vitals and nursing note reviewed.  Constitutional:      General: He is not in acute distress.    Appearance: Normal appearance. He is well-developed. He is ill-appearing.  HENT:     Head: Normocephalic and atraumatic.     Nose: Congestion present.     Mouth/Throat:     Mouth: Mucous membranes are moist.     Pharynx: Oropharynx is clear.  Eyes:     General: No scleral icterus.    Conjunctiva/sclera: Conjunctivae normal.  Cardiovascular:     Rate and Rhythm: Normal rate and regular rhythm.     Heart sounds: Normal heart sounds.  Pulmonary:     Effort: Respiratory distress present.     Breath sounds: Wheezing present.  Musculoskeletal:     Cervical back: Neck supple.  Skin:    General: Skin is warm and dry.     Capillary Refill: Capillary refill takes less than 2 seconds.  Neurological:     General: No focal deficit present.     Mental Status: He is alert. Mental status is at baseline.     Motor: No weakness.     Gait: Gait normal.  Psychiatric:        Mood and Affect: Mood normal.        Behavior: Behavior normal.      UC Treatments / Results  Labs (all labs ordered are listed, but only abnormal results are displayed) Labs Reviewed - No data to display  EKG   Radiology No results found.  Procedures Procedures (including critical care time)  Medications Ordered in UC Medications  dexamethasone (DECADRON) injection 10 mg (has no administration in time range)  cefTRIAXone (ROCEPHIN) injection 1 g (has no administration in time range)  ipratropium-albuterol (DUONEB) 0.5-2.5 (3) MG/3ML nebulizer solution 3 mL (3 mLs Nebulization Given 09/24/23 1525)    Initial  Impression / Assessment and Plan / UC Course  I have reviewed the triage vital signs and the nursing notes.  Pertinent labs & imaging results that were available during my care of the patient were reviewed by me and considered in my medical decision making (see chart for details).   78 year old male with significant medical history for heart disease and lung disease presents for 5-day history of fatigue, cough, congestion and shortness of breath.  Current temperature 100.3 degrees.  Blood pressure elevated at 183/71.  Oxygen saturation 93%.  Chest x-ray obtained to evaluate for possible pneumonia.  Concern for right middle lobe pneumonia and left lower lobe pneumonia.  Reviewed this with patient.  Patient given 1 g IM Rocephin in clinic.  Will treat at this time with cefdinir and doxycycline.  Initially thought to treating with Levaquin but this can cause QT  prolongation and interact with his other medications.  Patient also given 10 mg IM dexamethasone.  Sent prednisone to pharmacy.  Patient given DuoNeb breathing treatment.  Oxygen saturation increased to 96% following DuoNeb.  Patient was given a nebulizer machine and I sent DuoNeb solution to pharmacy.  Advised to continue with treatments at home.  Continue other inhalers as well.  Sent Promethazine DM for cough.  Encouraged increasing rest and fluids.  Reviewed very close monitoring and go to ER if not breaking fever a couple days, increased weakness or fatigue, increased breathing problem or oxygen below 90% or no improvement at all in a couple of days.  Patient and wife are agreeable to plan.  They would like to avoid the ED if possible.  Chest x-ray over read does not no pneumonia.  Patient will be treated anyway.  No change to treatment plan.   Final Clinical Impressions(s) / UC Diagnoses   Final diagnoses:  Shortness of breath  Pneumonia of right lung due to infectious organism, unspecified part of lung  COPD exacerbation (HCC)   Fever, unspecified  Essential hypertension     Discharge Instructions      -X-ray shows evidence of pneumonia.  You were given an injection of ceftriaxone antibiotic in the clinic.  Start the cefdinir later tonight.  Start the doxycycline when you pick it up.  These medications will cover you for pneumonia. - You were given an injection of corticosteroid today.  Start the prednisone tomorrow. - Sent cough medicine. - You were given a breathing treatment in clinic.  May use nebulizer at home every 4-6 hours as needed.  Continue using other inhalers.  Increase rest and fluids. - Keep a check on your oxygen.  If lower than 90%, fever is not breaking the next couple of days or breathing worsens, call 911 or go to ER. - Blood pressure is elevated at 183/71.  Continue taking blood pressure medication and keep your blood pressure.  If blood pressures consistently greater than 140/90 you should talk with your primary care provider about medication changes.     ED Prescriptions     Medication Sig Dispense Auth. Provider   predniSONE (DELTASONE) 20 MG tablet Take 2 tablets (40 mg total) by mouth daily for 5 days. 10 tablet Eusebio Friendly B, PA-C   ipratropium-albuterol (DUONEB) 0.5-2.5 (3) MG/3ML SOLN Take 3 mLs by nebulization every 6 (six) hours as needed (shorntess of breath). 360 mL Eusebio Friendly B, PA-C   promethazine-dextromethorphan (PROMETHAZINE-DM) 6.25-15 MG/5ML syrup Take 5 mLs by mouth 4 (four) times daily as needed. 118 mL Eusebio Friendly B, PA-C   doxycycline (VIBRAMYCIN) 100 MG capsule Take 1 capsule (100 mg total) by mouth 2 (two) times daily for 7 days. 14 capsule Eusebio Friendly B, PA-C   cefdinir (OMNICEF) 300 MG capsule Take 1 capsule (300 mg total) by mouth 2 (two) times daily for 7 days. 14 capsule Shirlee Latch, PA-C      PDMP not reviewed this encounter.   Shirlee Latch, PA-C 09/24/23 1735

## 2023-09-24 NOTE — Discharge Instructions (Addendum)
-  X-ray shows evidence of pneumonia.  You were given an injection of ceftriaxone antibiotic in the clinic.  Start the cefdinir later tonight.  Start the doxycycline when you pick it up.  These medications will cover you for pneumonia. - You were given an injection of corticosteroid today.  Start the prednisone tomorrow. - Sent cough medicine. - You were given a breathing treatment in clinic.  May use nebulizer at home every 4-6 hours as needed.  Continue using other inhalers.  Increase rest and fluids. - Keep a check on your oxygen.  If lower than 90%, fever is not breaking the next couple of days or breathing worsens, call 911 or go to ER. - Blood pressure is elevated at 183/71.  Continue taking blood pressure medication and keep your blood pressure.  If blood pressures consistently greater than 140/90 you should talk with your primary care provider about medication changes.

## 2023-09-24 NOTE — Telephone Encounter (Signed)
  Chief Complaint: Shortness of breath Symptoms: SOB, productive cough, chest soreness Frequency: patient noticed shortness of breath when he woke up Pertinent Negatives: Patient denies fever Disposition: [x] ED /[] Urgent Care (no appt availability in office) / [] Appointment(In office/virtual)/ []  [place] [facility]/ [] Home Care/ [] Refused Recommended Disposition /[] [place] [facility]/ []  Follow-up with PCP Additional Notes: patient called with concerns for shortness of breath. Patient is very short of breath speaking to this RN. States he woke up this morning short of breath which has increased throughout the day. Patient endorses productive cough which this RN did hear while one call. Per protocol, the recommendation is for ED based on symptoms and hx of COPD. Patient states he may go to the ED or talk to his "Doc in a box" patient verbalized understanding of plan and all questions answered.    Copied from CRM [id number]. Topic: Clinical - Red Word Triage >> Sep 24, 2023 12:59 PM Almira Coaster wrote: Red Word that prompted transfer to Nurse Triage: Patient is experiencing difficulty breathing since this morning, He suffers from COPD. Reason for Disposition  [1] MODERATE difficulty breathing (e.g., speaks in phrases, SOB even at rest, pulse 100-120) AND [2] NEW-onset or WORSE than normal  Answer Assessment - Initial Assessment Questions 1. RESPIRATORY STATUS: "Describe your breathing?" (e.g., wheezing, shortness of breath, unable to speak, severe coughing)      Shortness of breath 2. ONSET: "When did this breathing problem begin?"      Started this morning 3. PATTERN "Does the difficult breathing come and go, or has it been constant since it started?"      Constant since starting 4. SEVERITY: "How bad is your breathing?" (e.g., mild, moderate, severe)    - MILD: No SOB at rest, mild SOB with walking, speaks normally in sentences, can lie down, no retractions, pulse < 100.    - MODERATE: SOB at  rest, SOB with minimal exertion and prefers to sit, cannot lie down flat, speaks in phrases, mild retractions, audible wheezing, pulse 100-120.    - SEVERE: Very SOB at rest, speaks in single words, struggling to breathe, sitting hunched forward, retractions, pulse > 120      Severe 5. RECURRENT SYMPTOM: "Have you had difficulty breathing before?" If Yes, ask: "When was the last time?" and "What happened that time?"      Yes but was years ago 6. CARDIAC HISTORY: "Do you have any history of heart disease?" (e.g., heart attack, angina, bypass surgery, angioplasty)      Heart disease 7. LUNG HISTORY: "Do you have any history of lung disease?"  (e.g., pulmonary embolus, asthma, emphysema)     COPD 8. CAUSE: "What do you think is causing the breathing problem?"      unsure 9. OTHER SYMPTOMS: "Do you have any other symptoms? (e.g., dizziness, runny nose, cough, chest pain, fever)     Chest pain, cough 10. O2 SATURATION MONITOR:  "Do you use an oxygen saturation monitor (pulse oximeter) at home?" If Yes, ask: "What is your reading (oxygen level) today?" "What is your usual oxygen saturation reading?" (e.g., 95%)       95% RA  Protocols used: Breathing Difficulty-A-AH

## 2023-09-24 NOTE — Telephone Encounter (Signed)
 This is a Educational psychologist pt

## 2023-09-24 NOTE — Telephone Encounter (Signed)
 Please contact pt for future appointment. Pt due for 6 month f/u.

## 2023-09-24 NOTE — ED Triage Notes (Signed)
 Pt c/o cough,sob & wheezing x5 days. Hx of COPD & CLL.

## 2023-09-25 NOTE — Addendum Note (Signed)
 Addended by: Elease Etienne A on: 09/25/2023 11:27 AM   Modules accepted: Orders

## 2023-09-25 NOTE — Progress Notes (Signed)
 Remote pacemaker transmission.

## 2023-10-02 ENCOUNTER — Other Ambulatory Visit: Payer: Self-pay

## 2023-10-02 ENCOUNTER — Ambulatory Visit: Admission: EM | Admit: 2023-10-02 | Discharge: 2023-10-02 | Disposition: A | Discharge status: Home / Self Care

## 2023-10-02 ENCOUNTER — Encounter: Payer: Self-pay | Admitting: Emergency Medicine

## 2023-10-02 DIAGNOSIS — R0602 Shortness of breath: Secondary | ICD-10-CM | POA: Diagnosis present

## 2023-10-02 NOTE — Discharge Instructions (Signed)
 GO to ER for further evaluation of SOB

## 2023-10-02 NOTE — ED Provider Notes (Signed)
 [facility]    CSN: [id number] Arrival date & time: 10/02/23  1041      History   Chief Complaint Chief Complaint  Patient presents with   Cough   Shortness of Breath    HPI [name] is a 78 y.o. male.   78 year old male patient, [name], presents to urgent care for evaluation of shortness of breath that started yesterday and is worse today.  Patient was recently seen in this urgent care on 09/24/2023 for similar complaints and was treated with prednisone, Rocephin, doxycycline, and cefdinir for COPD exacerbation, shortness of breath, presumed pneumonia at that time.  Patient states he took his last pill was this morning and does not feel like he has improved. Pt states he called his PCP and was advised to go to ER for further evaluation.   Pmh: COPD, heart failure , chronic lymphocytic leukemia , A-fib, hypertension , hypokalemia , CVA , MI , PTSD ,anxiety  The history is provided by the patient. No language interpreter was used.    Past Medical History:  Diagnosis Date   Agent orange exposure    Anxiety    Arthritis    Atrial fibrillation (HCC) 2013   a.) Dx'd in 2013; recurrence 02/2014; b.) CHA2DS2-VASc = 7 (age x2, HFimpEF, HTN, CVA x 2, previous MI); c.) cardiac rate/rhythm maintained on oral amiodarone; chronically anticoagulated using apixaban   BPH (benign prostatic hyperplasia)    C. 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)    a.) MV 08/23/2015: EF 50%; b.) TTE 08/23/2015: EF 55-60%, G1DD; c.) TTE 11/02/2015: EF 55-60%, mild LA dil; d.) TTE 02/04/2020: EF 55-60%, mild LVH, G1DD, PASP 44.3, mild-mod AoV sclerosis; e.) MV 12/25/2020: EF 45-54%; f.)  R/LHC 02/04/2021: mRA 13, mPA 27, mPCWP 22, LVEDP 23, Ao sat 99, PA sat  70, CO 6.5, CI 3.0; g.) TTE 02/19/2021: EF 55-60%, G1DD, mildly red RVSF, triv MR, mild-mod AoV scler   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 EF 55%, mild antlat HK;  c.) s/p R/LHC 02/04/2021: LM mild diff dzs, LAD 100p, LCX nl, RCA 50p, 95d, 99 side branch, RPAV fills via LCx. SVG->RPDA 100, SVG->OM1 100, LIMA->LAD patent. EF 35-45%.   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    Hyperlipidemia LDL goal <70    Hypertension    Hypokalemia    Ischemic cardiomyopathy    a.) MV 08/23/2015: EF 50%; b.) TTE 08/23/2015: EF 55-60%; c.) TTE 11/02/2015: EF 55-60%; d.) TTE 02/04/2020: EF 55-60%; e.) MV 12/25/2020: EF 45-54%; f.) R/LHC 02/04/2021: mRA 13, mPA 27, mPCWP 22, LVEDP 23, Ao sat 99, PA sat 70,  CO 6.5, CI 3.0; g.) TTE 02/19/2021: EF 55-60%   Ischemic cerebrovascular accident (CVA) (HCC) 10/18/2021   Myocardial infarction (HCC) 2010   On amiodarone therapy    On apixaban therapy    OSA on CPAP    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   a.) LIMA-LAD, SVG-PDA, SVG-OM1   Sinus pause    a.) Zio patch study 08/27/2015 - 09/25/2015 --> episodes of 3 second  pauses noted while in both NSR and A.fib   SSS (sick sinus syndrome) (HCC)    a.) s/p MDT Advisa MRI SureScan PPM (SN#: [id number]) placement  on 03/03/2016   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   TIA (transient ischemic attack) 11/02/2015    Patient Active Problem List   Diagnosis Date Noted   Head injury 07/10/2023   Spondylosis, cervical, with myelopathy 04/21/2023   Cervical myelopathy (HCC) 04/21/2023   Campylobacter enteritis 04/02/2023   Class 1 obesity due to excess calories with body mass index (BMI) of 30.0 to 30.9 in adult 04/01/2023   Hypotension 03/31/2023   Campylobacter gastroenteritis 03/31/2023   Respiratory tract infection due to COVID-19 virus 03/10/2023   Spinal stenosis 01/09/2023   Dizziness 01/09/2023   BPH (benign prostatic hyperplasia) 01/09/2023   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   Sensorineural hearing loss, 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   (HFpEF) heart failure with preserved ejection fraction (HCC) 06/30/2022   Abnormal INR 06/30/2022   Anxiety 06/30/2022   GERD (gastroesophageal reflux disease) 06/30/2022   History of CVA (cerebrovascular accident) 06/30/2022   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/30/2022   Anemia 06/02/2022   Dysphagia 01/03/2022   Lymphadenopathy 01/03/2022   Chronic neck pain 01/03/2022   Hearing loss 12/23/2021   Personal history of tobacco use, presenting hazards to health 12/23/2021   Polyp of colon 12/23/2021   Neck pain 12/23/2021   Neck nodule 11/11/2021   CVA (cerebral vascular accident) (HCC) 10/18/2021   AKI (acute kidney injury) (HCC) 09/13/2021   Weakness 09/04/2021   COVID-19 virus infection 09/04/2021   Accelerating angina (HCC) 02/04/2021   Abnormal stress test    Cardiomyopathy (HCC)    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   CAD (coronary artery disease)    Depression    Arthritis 09/16/2019   Dehydration 03/14/2019   Sick sinus syndrome  (HCC) 07/08/2016   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   Thrombocytopenia (HCC) 02/29/2016   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    PAD (peripheral artery disease) (HCC)    OSA on CPAP    Chronic diastolic CHF (congestive heart failure) (HCC) 09/20/2015   CLL (chronic lymphocytic leukemia) (HCC)    PTSD (post-traumatic stress disorder)    Osteoarthritis of both knees 07/05/2015   COPD (chronic obstructive pulmonary disease) (HCC) 01/01/2012   Shortness of breath 10/09/2011   Paroxysmal atrial fibrillation (HCC) 10/09/2011   Hyperlipidemia 11/28/2009   Essential hypertension 11/28/2009    Past Surgical History:  Procedure Laterality Date   ABDOMINAL HERNIA REPAIR     APPENDECTOMY  06/21/1985   CORONARY ARTERY BYPASS GRAFT N/A 05/09/2009   EP IMPLANTABLE DEVICE N/A 03/03/2016   Procedure: Pacemaker Implant;  Surgeon: Duke Salvia, MD;  Location: [facility] INVASIVE  CV LAB;  Service: Cardiovascular;  Laterality: N/A;   FOREIGN BODY REMOVAL  1968   "shrapnel in my tailbone"   INGUINAL HERNIA REPAIR Right    JOINT REPLACEMENT Right 2018   LAPAROSCOPIC CHOLECYSTECTOMY     LEFT HEART CATH AND CORONARY ANGIOGRAPHY Left 05/08/2009   Procedure: LEFT HEART CATH AND CORONARY ANGIOGRAPHY; Location: [facility]: Surgeon: Lorine Bears, MD   LEFT HEART CATH AND CORS/GRAFTS ANGIOGRAPHY Left 10/17/2009   Procedure: LEFT HEART CATH AND CORS/GRAFTS ANGIOGRAPHY; Location: [facility]; Surgeon: Lorine Bears, MD   RIGHT/LEFT HEART CATH AND CORONARY/GRAFT ANGIOGRAPHY N/A 02/04/2021   Procedure: RIGHT/LEFT HEART CATH AND CORONARY/GRAFT ANGIOGRAPHY;  Surgeon: Yvonne Kendall, MD;  Location: [facility] INVASIVE CV LAB;  Service: Cardiovascular;  Laterality: N/A;   TONSILLECTOMY AND ADENOIDECTOMY  1956   TOTAL HIP ARTHROPLASTY Right 10/22/2016   Procedure: TOTAL HIP ARTHROPLASTY;  Surgeon: Donato Heinz, MD;  Location: [facility] ORS;  Service: Orthopedics;  Laterality: Right;   TOTAL HIP  ARTHROPLASTY Left 11/04/2017   Procedure: TOTAL HIP ARTHROPLASTY;  Surgeon: Donato Heinz, MD;  Location: [facility] ORS;  Service: Orthopedics;  Laterality: Left;       Home Medications    Prior to Admission medications   Medication Sig Start Date End Date Taking? Authorizing Provider  acalabrutinib maleate (CALQUENCE) 100 MG tablet Take 1 tablet (100 mg total) by mouth 2 (two) times daily. 09/03/23   Earna Coder, MD  acetaminophen (TYLENOL) 325 MG tablet Take 1-2 tablets (325-650 mg total) by mouth every 4 (four) hours as needed for mild pain. 11/01/21   Setzer, Lynnell Jude, PA-C  albuterol (VENTOLIN HFA) 108 (90 Base) MCG/ACT inhaler Inhale 2 puffs into the lungs every 6 (six) hours as needed for wheezing or shortness of breath. 11/01/21   Setzer, Lynnell Jude, PA-C  amiodarone (PACERONE) 200 MG tablet TAKE 1/2 TABLET BY MOUTH TWICE DAILY FOR 5 DAYS A WEEK 09/24/23   Duke Salvia, MD  Apple Cider Vinegar 500 MG TABS Take 1 tablet by mouth daily.    [provider]  azelastine (ASTELIN) 0.1 % nasal spray Place 2 sprays into both nostrils 2 (two) times daily. Use in each nostril as directed 03/10/23   Bethanie Dicker, NP  cetirizine (ZYRTEC) 10 MG tablet Take 10 mg by mouth daily as needed for allergies.     [provider]  Cholecalciferol (VITAMIN D3) 250 MCG (10000 UT) capsule Take 10,000 Units by mouth daily.    [provider]  Coenzyme Q10 (COQ10) 200 MG CAPS Take 200 mg by mouth daily.    [provider]  cyanocobalamin (VITAMIN B12) 1000 MCG tablet Take 1,000 mcg by mouth daily.    [provider]  Evolocumab (REPATHA SURECLICK) 140 MG/ML SOAJ Inject 140 mg into the skin every 14 (fourteen) days. 10/10/22   Antonieta Iba, MD  ezetimibe (ZETIA) 10 MG tablet Take 1 tablet (10 mg total) by mouth daily. Patient taking differently: Take 10 mg by mouth every morning. 01/06/17 11/01/27  Antonieta Iba, MD  ferrous sulfate 325 (65 FE) MG EC tablet  Take 325 mg by mouth daily.    [provider]  fluticasone-salmeterol (WIXELA INHUB) 100-50 MCG/ACT AEPB Inhale 2 puffs into the lungs 2 (two) times daily.    [provider]  ipratropium-albuterol (DUONEB) 0.5-2.5 (3) MG/3ML SOLN Take 3 mLs by nebulization every 6 (six) hours as needed (shorntess of breath). 09/24/23   Eusebio Friendly B, PA-C  mirtazapine (REMERON) 15 MG tablet Take 15  mg by mouth at bedtime as needed (for panic associated with PTSD).    [provider]  mometasone-formoterol (DULERA) 200-5 MCG/ACT AERO Inhale 2 puffs into the lungs 2 (two) times daily.    [provider]  Multiple Vitamin (MULTIVITAMIN WITH MINERALS) TABS tablet Take 1 tablet by mouth daily.    [provider]  pantoprazole (PROTONIX) 40 MG tablet TAKE 1 TABLET(40 MG) BY MOUTH DAILY 04/15/23   Glori Luis, MD  promethazine-dextromethorphan (PROMETHAZINE-DM) 6.25-15 MG/5ML syrup Take 5 mLs by mouth 4 (four) times daily as needed. 09/24/23   Shirlee Latch, PA-C  senna (SENOKOT) 8.6 MG TABS tablet Take 1 tablet (8.6 mg total) by mouth daily as needed for mild constipation. 04/24/23   Susanne Borders, PA  tamsulosin (FLOMAX) 0.4 MG CAPS capsule TAKE 1 CAPSULE(0.4 MG) BY MOUTH DAILY 05/12/23   Glori Luis, MD  Tiotropium Bromide Monohydrate 2.5 MCG/ACT AERS Inhale 2 puffs into the lungs at bedtime. Spiriva 08/07/20   [provider]  tiZANidine (ZANAFLEX) 4 MG capsule Take 1 capsule (4 mg total) by mouth 3 (three) times daily. This can make you sleepy. 04/24/23   Drake Leach, PA-C  traMADol (ULTRAM) 50 MG tablet Take 50 mg by mouth 2 (two) times daily.    [provider]    Family History Family History  Problem Relation Age of Onset   Heart disease Mother    Heart attack Mother    Coronary artery disease Other        family history    Social History Social History   Tobacco Use   Smoking status: Former    Current packs/day: 0.00     Average packs/day: 1 pack/day for 40.0 years (40.0 ttl pk-yrs)    Types: Cigarettes    Start date: 07/21/1966    Quit date: 07/21/2006    Years since quitting: 17.2   Smokeless tobacco: Never  Vaping Use   Vaping status: Former  Substance Use Topics   Alcohol use: Not Currently   Drug use: No     Allergies   Atorvastatin and Augmentin [amoxicillin-pot clavulanate]   Review of Systems Review of Systems  Constitutional:  Negative for fever.  Respiratory:  Positive for shortness of breath.   All other systems reviewed and are negative.    Physical Exam Triage Vital Signs ED Triage Vitals  Encounter Vitals Group     BP 10/02/23 1048 (!) 156/72     Systolic BP Percentile --      Diastolic BP Percentile --      Pulse Rate 10/02/23 1048 66     Resp 10/02/23 1048 16     Temp 10/02/23 1048 97.8 F (36.6 C)     Temp Source 10/02/23 1048 Oral     SpO2 10/02/23 1048 99 %     Weight 10/02/23 1047 195 lb 1.7 oz (88.5 kg)     Height 10/02/23 1047 5\' 8"  (1.727 m)     Head Circumference --      Peak Flow --      Pain Score 10/02/23 1047 0     Pain Loc --      Pain Education --      Exclude from Growth Chart --    No data found.  Updated Vital Signs BP (!) 156/72 (BP Location: Left Arm)   Pulse 66   Temp 97.8 F (36.6 C) (Oral)   Resp 16   Ht 5\' 8"  (1.727 m)  Wt 195 lb 1.7 oz (88.5 kg)   SpO2 99%   BMI 29.67 kg/m   Visual Acuity Right Eye Distance:   Left Eye Distance:   Bilateral Distance:    Right Eye Near:   Left Eye Near:    Bilateral Near:     Physical Exam Vitals and nursing note reviewed.  Constitutional:      General: He is not in acute distress.    Appearance: He is well-developed. He is not ill-appearing or toxic-appearing.  HENT:     Head: Normocephalic.     Right Ear: Tympanic membrane is retracted.     Left Ear: Tympanic membrane is retracted.     Nose: Mucosal edema and congestion present.     Mouth/Throat:     Lips: Pink.     Mouth: Mucous  membranes are moist.     Pharynx: Uvula midline. Posterior oropharyngeal erythema present.  Eyes:     General: Lids are normal.     Conjunctiva/sclera: Conjunctivae normal.     Pupils: Pupils are equal, round, and reactive to light.  Cardiovascular:     Rate and Rhythm: Normal rate and regular rhythm.     Heart sounds: Normal heart sounds.  Pulmonary:     Effort: Pulmonary effort is normal. No respiratory distress.     Breath sounds: Normal breath sounds and air entry. No decreased breath sounds or wheezing.  Abdominal:     General: There is no distension.     Palpations: Abdomen is soft.  Musculoskeletal:        General: Normal range of motion.     Cervical back: Normal range of motion.  Skin:    General: Skin is warm and dry.     Findings: No rash.  Neurological:     General: No focal deficit present.     Mental Status: He is alert and oriented to person, place, and time.     GCS: GCS eye subscore is 4. GCS verbal subscore is 5. GCS motor subscore is 6.  Psychiatric:        Attention and Perception: Attention normal.        Mood and Affect: Mood is anxious.        Speech: Speech normal.        Behavior: Behavior is cooperative.      UC Treatments / Results  Labs (all labs ordered are listed, but only abnormal results are displayed) Labs Reviewed - No data to display  EKG   Radiology No results found.  Procedures Procedures (including critical care time)  Medications Ordered in UC Medications - No data to display  Initial Impression / Assessment and Plan / UC Course  I have reviewed the triage vital signs and the nursing notes.  Pertinent labs & imaging results that were available during my care of the patient were reviewed by me and considered in my medical decision making (see chart for details).  Clinical Course as of 10/02/23 1120  Fri Oct 02, 2023  1105 Discussed exam findings and plan of care with patient , given patient's extensive past medical  history recommend further evaluation in the emergency room , patient declines local EMS transport to [facility] , patient states his wife will take him by POV to [facility] ER for further evaluation.  O2 sat is 99% on room air, patient is afebrile, heart rate 66, respirations 16, BP 156/72.  Patient verbalized understanding of plan of care to this provider.  [JD]  Clinical Course User Index [JD] Cyleigh Massaro, Para March, NP    Ddx: SOB, COPD exacerbation, anxiety, CLL, CHF Final Clinical Impressions(s) / UC Diagnoses   Final diagnoses:  Shortness of breath     Discharge Instructions      GO to ER for further evaluation of SOB    ED Prescriptions   None    PDMP not reviewed this encounter.   Clancy Gourd, NP 10/02/23 1120

## 2023-10-02 NOTE — ED Notes (Signed)
 Patient is being discharged from the Urgent Care and sent to the Emergency Department via private vehicle with wife . Per Clancy Gourd, NP, patient is in need of higher level of care due to worsening SOB and chest congestion. Patient is aware and verbalizes understanding of plan of care.  Vitals:   10/02/23 1048  BP: (!) 156/72  Pulse: 66  Resp: 16  Temp: 97.8 F (36.6 C)  SpO2: 99%

## 2023-10-02 NOTE — ED Triage Notes (Signed)
 Patient states that he is taking medicine for Pneumonia.  Patient reports SOB that started yesterday and was worse this morning.   Patient denies fevers.

## 2023-10-02 NOTE — Telephone Encounter (Signed)
 Patient not ready to schedule at this time - will call back

## 2023-10-06 ENCOUNTER — Other Ambulatory Visit: Payer: Self-pay

## 2023-10-06 ENCOUNTER — Emergency Department

## 2023-10-06 ENCOUNTER — Telehealth: Payer: Self-pay | Admitting: *Deleted

## 2023-10-06 ENCOUNTER — Emergency Department
Admission: EM | Admit: 2023-10-06 | Discharge: 2023-10-06 | Disposition: A | Discharge status: Home / Self Care | Attending: Student in an Organized Health Care Education/Training Program | Admitting: Student in an Organized Health Care Education/Training Program

## 2023-10-06 ENCOUNTER — Encounter: Payer: Self-pay | Admitting: Emergency Medicine

## 2023-10-06 DIAGNOSIS — J189 Pneumonia, unspecified organism: Secondary | ICD-10-CM

## 2023-10-06 DIAGNOSIS — J441 Chronic obstructive pulmonary disease with (acute) exacerbation: Secondary | ICD-10-CM | POA: Insufficient documentation

## 2023-10-06 DIAGNOSIS — Z7901 Long term (current) use of anticoagulants: Secondary | ICD-10-CM | POA: Diagnosis not present

## 2023-10-06 DIAGNOSIS — R0602 Shortness of breath: Secondary | ICD-10-CM | POA: Diagnosis present

## 2023-10-06 DIAGNOSIS — I509 Heart failure, unspecified: Secondary | ICD-10-CM | POA: Diagnosis not present

## 2023-10-06 LAB — TROPONIN I (HIGH SENSITIVITY)
Troponin I (High Sensitivity): 8 ng/L (ref <18)
Troponin I (High Sensitivity): 9 ng/L (ref <18)

## 2023-10-06 LAB — CBC
HCT: 36.7 % — ABNORMAL LOW (ref 39.0–52.0)
Hemoglobin: 12 g/dL — ABNORMAL LOW (ref 13.0–17.0)
MCH: 33.1 pg (ref 26.0–34.0)
MCHC: 32.7 g/dL (ref 30.0–36.0)
MCV: 101.4 fL — ABNORMAL HIGH (ref 80.0–100.0)
Platelets: 173 10*3/uL (ref 150–400)
RBC: 3.62 MIL/uL — ABNORMAL LOW (ref 4.22–5.81)
RDW: 14.4 % (ref 11.5–15.5)
WBC: 17.5 10*3/uL — ABNORMAL HIGH (ref 4.0–10.5)
nRBC: 0 % (ref 0.0–0.2)

## 2023-10-06 LAB — BASIC METABOLIC PANEL
Anion gap: 9 (ref 5–15)
BUN: 27 mg/dL — ABNORMAL HIGH (ref 8–23)
CO2: 23 mmol/L (ref 22–32)
Calcium: 8.5 mg/dL — ABNORMAL LOW (ref 8.9–10.3)
Chloride: 108 mmol/L (ref 98–111)
Creatinine, Ser: 0.8 mg/dL (ref 0.61–1.24)
GFR, Estimated: >60 mL/min (ref >60)
Glucose, Bld: 116 mg/dL — ABNORMAL HIGH (ref 70–99)
Potassium: 3.9 mmol/L (ref 3.5–5.1)
Sodium: 140 mmol/L (ref 135–145)

## 2023-10-06 MED ORDER — MOXIFLOXACIN HCL 400 MG PO TABS: 400.0000 mg | ORAL_TABLET | Freq: Every day | ORAL | 0 refills | Status: AC

## 2023-10-06 MED ORDER — PREDNISONE 20 MG PO TABS: 40.0000 mg | ORAL_TABLET | Freq: Every day | ORAL | 0 refills | Status: AC

## 2023-10-06 MED ORDER — IPRATROPIUM-ALBUTEROL 0.5-2.5 (3) MG/3ML IN SOLN
3.0000 mL | Freq: Once | RESPIRATORY_TRACT | Status: AC
  Administered 2023-10-06: 3 mL via RESPIRATORY_TRACT
  Filled 2023-10-06: qty 3

## 2023-10-06 MED ORDER — PREDNISONE 20 MG PO TABS
40.0000 mg | ORAL_TABLET | Freq: Once | ORAL | Status: AC
  Administered 2023-10-06: 40 mg via ORAL
  Filled 2023-10-06: qty 2

## 2023-10-06 MED ORDER — IOHEXOL 350 MG/ML SOLN
75.0000 mL | Freq: Once | INTRAVENOUS | Status: AC | PRN
  Administered 2023-10-06: 75 mL via INTRAVENOUS

## 2023-10-06 NOTE — ED Triage Notes (Signed)
 Patient to ED via [facility] from home for right sided CP. Started this AM. Hx of afib and CLL.

## 2023-10-06 NOTE — ED Notes (Signed)
 First Nurse Note: Pt to ED via [facility] from home for chest pain and shortness of breath that started 1 hour ago. Pt is having pain on the right side of his chest. Pt has had a cough x 3 weeks. Pt has cardiac hx and hx of Leukemia. VSS with EMS. Pt took 4 81 mg Aspirin prior to EMS arrival.

## 2023-10-06 NOTE — ED Provider Notes (Signed)
-----------------------------------------   3:21 PM on 10/06/2023 -----------------------------------------  Blood pressure 138/86, pulse 66, temperature 98.4 F (36.9 C), temperature source Oral, resp. rate 20, height 5\' 8"  (1.727 m), weight 88 kg, SpO2 99%.  Assuming care from Dr. Roxan Hockey.  In short, [name] is a 78 y.o. male with a chief complaint of Chest Pain .  Refer to the original H&P for additional details.  The current plan of care is to follow-up CTA chest for chest pain and SOB.  ----------------------------------------- 4:20 PM on 10/06/2023 ----------------------------------------- CTA chest is negative for PE, does show nodule with surrounding inflammatory changes concerning for pneumonia.  Patient recently treated with cephalosporin and doxycycline, will treat with moxifloxacin given he previously had C. difficile with Augmentin.  Patient was offered admission to the hospital, but declines and prefers to follow-up as an outpatient.  He was also prescribed course of steroids by previous provider.  He was counseled to return to the ED for new or worsening symptoms, patient and spouse agree with plan.     [name], MD 10/06/23 [phone]

## 2023-10-06 NOTE — ED Notes (Signed)
 Pt asking to use urinal, pt given urinal and stood at bedside. Urinal emptied.

## 2023-10-06 NOTE — ED Provider Notes (Signed)
 [facility] Provider Note    Event Date/Time   First MD Initiated Contact with Patient 10/06/23 1200     (approximate)   History   Chest Pain   HPI  [name] is a 78 y.o. male history of COPD CHF on apixaban presents to the ER for evaluation of persistent shortness of breath worsened this morning.  Does not wear home oxygen.  Denies any lower extremity swelling.  Having some right-sided chest pain.  Recently told that he had pneumonia at urgent care.  Completed course of antibiotics the beginning of this month.     Physical Exam   Triage Vital Signs: ED Triage Vitals  Encounter Vitals Group     BP 10/06/23 1036 135/79     Systolic BP Percentile --      Diastolic BP Percentile --      Pulse Rate 10/06/23 1036 63     Resp 10/06/23 1036 18     Temp 10/06/23 1036 98.4 F (36.9 C)     Temp Source 10/06/23 1036 Oral     SpO2 10/06/23 1036 97 %     Weight 10/06/23 1037 194 lb 0.1 oz (88 kg)     Height 10/06/23 1037 5\' 8"  (1.727 m)     Head Circumference --      Peak Flow --      Pain Score 10/06/23 1037 8     Pain Loc --      Pain Education --      Exclude from Growth Chart --     Most recent vital signs: Vitals:   10/06/23 1036 10/06/23 1405  BP: 135/79 138/86  Pulse: 63 66  Resp: 18 20  Temp: 98.4 F (36.9 C)   SpO2: 97% 99%     Constitutional: Alert  Eyes: Conjunctivae are normal.  Head: Atraumatic. Nose: No congestion/rhinnorhea. Mouth/Throat: Mucous membranes are moist.   Neck: Painless ROM.  Cardiovascular:   Good peripheral circulation. Respiratory: Normal respiratory effort.  No retractions.  Gastrointestinal: Soft and nontender.  Musculoskeletal:  no deformity Neurologic:  MAE spontaneously. No gross focal neurologic deficits are appreciated.  Skin:  Skin is warm, dry and intact. No rash noted. Psychiatric: Mood and affect are normal. Speech and behavior are normal.    ED Results / Procedures / Treatments    Labs (all labs ordered are listed, but only abnormal results are displayed) Labs Reviewed  BASIC METABOLIC PANEL - Abnormal; Notable for the following components:      Result Value   Glucose, Bld 116 (*)    BUN 27 (*)    Calcium 8.5 (*)    All other components within normal limits  CBC - Abnormal; Notable for the following components:   WBC 17.5 (*)    RBC 3.62 (*)    Hemoglobin 12.0 (*)    HCT 36.7 (*)    MCV 101.4 (*)    All other components within normal limits  TROPONIN I (HIGH SENSITIVITY)  TROPONIN I (HIGH SENSITIVITY)     EKG  ED ECG REPORT I, Willy Eddy, the attending physician, personally viewed and interpreted this ECG.   Date: 10/06/2023  EKG Time: 10:39  Rate: 60  Rhythm: sinus  Axis: normal  Intervals: normal  ST&T Change: no stemi, no depressions    RADIOLOGY Please see ED Course for my review and interpretation.  I personally reviewed all radiographic images ordered to evaluate for the above acute complaints and reviewed radiology reports and  findings.  These findings were personally discussed with the patient.  Please see medical record for radiology report.    PROCEDURES:  Critical Care performed:   Procedures   MEDICATIONS ORDERED IN ED: Medications  predniSONE (DELTASONE) tablet 40 mg (has no administration in time range)  ipratropium-albuterol (DUONEB) 0.5-2.5 (3) MG/3ML nebulizer solution 3 mL (3 mLs Nebulization Given 10/06/23 1218)  iohexol (OMNIPAQUE) 350 MG/ML injection 75 mL (75 mLs Intravenous Contrast Given 10/06/23 1331)     IMPRESSION / MDM / ASSESSMENT AND PLAN / ED COURSE  I reviewed the triage vital signs and the nursing notes.                              Differential diagnosis includes, but is not limited to, Asthma, copd, CHF, pna, ptx, malignancy, Pe, anemia  Patient presenting to the ER for evaluation of symptoms as described above.  Based on symptoms, risk factors and considered above differential, this  presenting complaint could reflect a potentially life-threatening illness therefore the patient will be placed on continuous pulse oximetry and telemetry for monitoring.  Laboratory evaluation will be sent to evaluate for the above complaints.      Clinical Course as of 10/06/23 1533  Tue Oct 06, 2023  1343 CTA on my review and interpretation without evidence of PE.  Possible consolidation versus mass will await formal radiology report. [PR]  1529 Patient reassessed.  He is feeling improved.  Has been observed for several hours here in the ER no worsening of shortness of breath no hypoxia.  He did feel some improvement with nebulizer will give some prednisone.  Awaiting formal radiology report on CTA patient will be signed out to oncoming physician.  Do anticipate discharge home will be appropriate as long as no concerning findings on CT. [PR]    Clinical Course User Index [PR] Willy Eddy, MD     FINAL CLINICAL IMPRESSION(S) / ED DIAGNOSES   Final diagnoses:  Chronic obstructive pulmonary disease with acute exacerbation (HCC)     Rx / DC Orders   ED Discharge Orders          Ordered    predniSONE (DELTASONE) 20 MG tablet  Daily        10/06/23 1530             Note:  This document was prepared using Dragon voice recognition software and may include unintentional dictation errors.    Willy Eddy, MD 10/06/23 [phone]

## 2023-10-06 NOTE — Telephone Encounter (Signed)
 Wife called to say that the pt. Is in EMS to go to the ER for chest pain.

## 2023-10-07 ENCOUNTER — Ambulatory Visit: Payer: Non-veteran care | Admitting: Neurosurgery

## 2023-10-09 ENCOUNTER — Other Ambulatory Visit: Payer: Self-pay

## 2023-10-09 NOTE — Progress Notes (Signed)
 Specialty Pharmacy Refill Coordination Note  [name] is a 78 y.o. male contacted today regarding refills of specialty medication(s) Acalabrutinib Maleate (Calquence)   Patient requested Delivery   Delivery date: 10/14/23   Verified address: [address]      Medication will be filled on 10/13/23.

## 2023-10-13 ENCOUNTER — Ambulatory Visit (INDEPENDENT_AMBULATORY_CARE_PROVIDER_SITE_OTHER): Admitting: Nurse Practitioner

## 2023-10-13 ENCOUNTER — Encounter: Payer: Self-pay | Admitting: Nurse Practitioner

## 2023-10-13 ENCOUNTER — Other Ambulatory Visit: Payer: Self-pay

## 2023-10-13 VITALS — BP 124/76 | HR 62 | Temp 97.5°F | Ht 68.0 in | Wt 201.0 lb

## 2023-10-13 DIAGNOSIS — J189 Pneumonia, unspecified organism: Secondary | ICD-10-CM | POA: Diagnosis not present

## 2023-10-13 LAB — CBC WITH DIFFERENTIAL/PLATELET
Basophils Absolute: 0 10*3/uL (ref 0.0–0.1)
Basophils Relative: 0.1 % (ref 0.0–3.0)
Eosinophils Absolute: 0.1 10*3/uL (ref 0.0–0.7)
Eosinophils Relative: 0.6 % (ref 0.0–5.0)
HCT: 35.3 % — ABNORMAL LOW (ref 39.0–52.0)
Hemoglobin: 11.7 g/dL — ABNORMAL LOW (ref 13.0–17.0)
Lymphocytes Relative: 68.1 % — ABNORMAL HIGH (ref 12.0–46.0)
Lymphs Abs: 11.3 10*3/uL — ABNORMAL HIGH (ref 0.7–4.0)
MCHC: 33.2 g/dL (ref 30.0–36.0)
MCV: 100.9 fl — ABNORMAL HIGH (ref 78.0–100.0)
Monocytes Absolute: 0.6 10*3/uL (ref 0.1–1.0)
Monocytes Relative: 3.7 % (ref 3.0–12.0)
Neutro Abs: 4.6 10*3/uL (ref 1.4–7.7)
Neutrophils Relative %: 27.5 % — ABNORMAL LOW (ref 43.0–77.0)
Platelets: 137 10*3/uL — ABNORMAL LOW (ref 150.0–400.0)
RBC: 3.5 Mil/uL — ABNORMAL LOW (ref 4.22–5.81)
RDW: 15.3 % (ref 11.5–15.5)
WBC: 16.7 10*3/uL — ABNORMAL HIGH (ref 4.0–10.5)

## 2023-10-13 NOTE — Assessment & Plan Note (Addendum)
 Pneumonia was confirmed by chest CT in ED. He was treated with antibiotics, steroids, and a nebulizer. Symptoms have improved with no fever and stable oxygen levels. Order a follow-up chest CT in mid-April to assess resolution and repeat lab work to check white blood cell count. Continue nebulizer and inhaler as needed. Advise him to report any worsening symptoms.

## 2023-10-13 NOTE — Progress Notes (Signed)
 Bethanie Dicker, NP-C Phone: [phone]  [name] is a 78 y.o. male who presents today for hospital follow up.   Discussed the use of AI scribe software for clinical note transcription with the patient, who gave verbal consent to proceed.  History of Present Illness   [name] "[name]" is a 78 year old male who presents for a hospital follow-up after being treated for pneumonia.  He was initially diagnosed with pneumonia at an urgent care facility and treated with antibiotics. Despite this, his symptoms persisted, leading to an emergency room visit where further imaging, including chest x-rays and a CT scan, confirmed pneumonia. A stronger antibiotic regimen, steroids, and nebulizer treatments every six hours were administered. He completed the course of antibiotics and has experienced improvement in his shortness of breath. No current fever is noted, and his oxygen levels are stable.  He recalls a challenging experience in the ER, including long wait times and multiple blood draws. A high white blood cell count was noted during his ER visit, likely due to the infection. He also has a pacemaker.  He uses an inhaler and has a nebulizer at home, as well as a CPAP machine which he uses at night. These have been part of his routine management for respiratory support.      Social History   Tobacco Use  Smoking Status Former   Current packs/day: 0.00   Average packs/day: 1 pack/day for 40.0 years (40.0 ttl pk-yrs)   Types: Cigarettes   Start date: 07/21/1966   Quit date: 07/21/2006   Years since quitting: 17.2  Smokeless Tobacco Never    Current Outpatient Medications on File Prior to Visit  Medication Sig Dispense Refill   acalabrutinib maleate (CALQUENCE) 100 MG tablet Take 1 tablet (100 mg total) by mouth 2 (two) times daily. 60 tablet 2   acetaminophen (TYLENOL) 325 MG tablet Take 1-2 tablets (325-650 mg total) by mouth every 4 (four) hours as needed for mild pain.     albuterol  (VENTOLIN HFA) 108 (90 Base) MCG/ACT inhaler Inhale 2 puffs into the lungs every 6 (six) hours as needed for wheezing or shortness of breath. 1 each 0   amiodarone (PACERONE) 200 MG tablet TAKE 1/2 TABLET BY MOUTH TWICE DAILY FOR 5 DAYS A WEEK 60 tablet 0   Apple Cider Vinegar 500 MG TABS Take 1 tablet by mouth daily.     azelastine (ASTELIN) 0.1 % nasal spray Place 2 sprays into both nostrils 2 (two) times daily. Use in each nostril as directed 30 mL 1   cetirizine (ZYRTEC) 10 MG tablet Take 10 mg by mouth daily as needed for allergies.      Cholecalciferol (VITAMIN D3) 250 MCG (10000 UT) capsule Take 10,000 Units by mouth daily.     Coenzyme Q10 (COQ10) 200 MG CAPS Take 200 mg by mouth daily.     cyanocobalamin (VITAMIN B12) 1000 MCG tablet Take 1,000 mcg by mouth daily.     Evolocumab (REPATHA SURECLICK) 140 MG/ML SOAJ Inject 140 mg into the skin every 14 (fourteen) days. 2 mL 11   ezetimibe (ZETIA) 10 MG tablet Take 1 tablet (10 mg total) by mouth daily. (Patient taking differently: Take 10 mg by mouth every morning.) 90 tablet 3   ferrous sulfate 325 (65 FE) MG EC tablet Take 325 mg by mouth daily.     fluticasone-salmeterol (WIXELA INHUB) 100-50 MCG/ACT AEPB Inhale 2 puffs into the lungs 2 (two) times daily.     ipratropium-albuterol (  DUONEB) 0.5-2.5 (3) MG/3ML SOLN Take 3 mLs by nebulization every 6 (six) hours as needed (shorntess of breath). 360 mL 0   mirtazapine (REMERON) 15 MG tablet Take 15 mg by mouth at bedtime as needed (for panic associated with PTSD).     mometasone-formoterol (DULERA) 200-5 MCG/ACT AERO Inhale 2 puffs into the lungs 2 (two) times daily.     moxifloxacin (AVELOX) 400 MG tablet Take 1 tablet (400 mg total) by mouth daily at 8 pm for 10 days. 10 tablet 0   Multiple Vitamin (MULTIVITAMIN WITH MINERALS) TABS tablet Take 1 tablet by mouth daily.     pantoprazole (PROTONIX) 40 MG tablet TAKE 1 TABLET(40 MG) BY MOUTH DAILY 90 tablet 1   tamsulosin (FLOMAX) 0.4 MG CAPS  capsule TAKE 1 CAPSULE(0.4 MG) BY MOUTH DAILY 30 capsule 3   Tiotropium Bromide Monohydrate 2.5 MCG/ACT AERS Inhale 2 puffs into the lungs at bedtime. Spiriva     traMADol (ULTRAM) 50 MG tablet Take 50 mg by mouth 2 (two) times daily.     tiZANidine (ZANAFLEX) 4 MG capsule Take 1 capsule (4 mg total) by mouth 3 (three) times daily. This can make you sleepy. (Patient not taking: Reported on 10/13/2023) 60 capsule 0   Current Facility-Administered Medications on File Prior to Visit  Medication Dose Route Frequency Provider Last Rate Last Admin   potassium chloride 10 mEq in 100 mL IVPB  10 mEq Intravenous Once Borders, Daryl Eastern, NP         ROS see history of present illness  Objective  Physical Exam Vitals:   10/13/23 1102  BP: 124/76  Pulse: 62  Temp: (!) 97.5 F (36.4 C)  SpO2: 99%    BP Readings from Last 3 Encounters:  10/13/23 124/76  10/06/23 122/67  10/02/23 (!) 156/72   Wt Readings from Last 3 Encounters:  10/13/23 201 lb (91.2 kg)  10/06/23 194 lb 0.1 oz (88 kg)  10/02/23 195 lb 1.7 oz (88.5 kg)    Physical Exam Constitutional:      General: He is not in acute distress.    Appearance: Normal appearance.  HENT:     Head: Normocephalic.  Cardiovascular:     Rate and Rhythm: Normal rate and regular rhythm.     Heart sounds: Normal heart sounds.  Pulmonary:     Effort: Pulmonary effort is normal.     Breath sounds: Normal breath sounds.  Skin:    General: Skin is warm and dry.  Neurological:     General: No focal deficit present.     Mental Status: He is alert.  Psychiatric:        Mood and Affect: Mood normal.        Behavior: Behavior normal.     Assessment/Plan: Please see individual problem list.  Pneumonia of right upper lobe due to infectious organism Assessment & Plan: Pneumonia was confirmed by chest CT in ED. He was treated with antibiotics, steroids, and a nebulizer. Symptoms have improved with no fever and stable oxygen levels. Order a  follow-up chest CT in mid-April to assess resolution and repeat lab work to check white blood cell count. Continue nebulizer and inhaler as needed. Advise him to report any worsening symptoms.   Orders: -     CBC with Differential/Platelet -     CT CHEST W CONTRAST; Future    Return in 3 months (on 01/07/2024) for TOC as scheduled.   Bethanie Dicker, NP-C [facility]

## 2023-10-14 NOTE — Progress Notes (Unsigned)
 Electrophysiology Clinic Note    Date:  10/15/2023  Patient ID:  Doyle, Michael 01-Aug-1945, MRN [id number] PCP:  Glori Luis, MD (Inactive)  Cardiologist:  Julien Nordmann, MD Electrophysiologist: Sherryl Manges, MD   Discussed the use of AI scribe software for clinical note transcription with the patient, who gave verbal consent to proceed.   Patient Profile    Chief Complaint: AFib follow-up  History of Present Illness: [name] is a 78 y.o. male with PMH notable for SND s/p PPM, CHF, CAD s/p CABG, parox AFib, HTN, COPD, OSA, CLL ; seen today for Sherryl Manges, MD for routine electrophysiology followup.   I last saw him 03/2023 at which time he was doing overall well. Noted some DOE with walking uphill, thought to be d/t COPD. Low AF burden on amiodarone 500mg  / week. He has been seen several times this past month for pneumonia.  On follow-up today, he has not had any AFib episodes that he is aware of. Continues to take amiodarone 500mg  / week. Continues to take eliquis BID, no bleeding concerns. He does have significant bruising on bilat forearms and hands from multiple PIVs during hospitalizations.  He otherwise has no significant bleeding concerns through GI, GU tracts.   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 All other systems are reviewed and otherwise negative.    Physical Exam    VS:  BP 120/66 (BP Location: Left Arm, Patient Position: Sitting, Cuff Size: Normal)   Pulse 63   Ht 5\' 9"  (1.753 m)   Wt 202 lb (91.6 kg)   SpO2 99%   BMI 29.83 kg/m  BMI: Body mass index is 29.83 kg/m.  Wt Readings from Last 3 Encounters:  10/15/23 202 lb (91.6 kg)  10/13/23 201 lb (91.2 kg)  10/06/23 194 lb 0.1 oz (88 kg)     GEN- The patient is well appearing, alert and oriented x 3 today.    Lungs- Clear to ausculation bilaterally, normal work of breathing.  Heart- Regular rate and rhythm, no murmurs, rubs or gallops Extremities- No peripheral edema, warm, dry. Multiple areas of ecchymosis throughout upper extremities in various states of healing Skin-   device pocket well-healed, no tethering   Device interrogation done today and reviewed by myself:  Battery 2.5 years Lead thresholds, impedence, sensing stable  No AF Blunted histograms at LRL Reduced activity acceleration No further changes made today   Studies Reviewed   Previous EP, cardiology notes.    EKG is not ordered. Personal review of EKG from  10/06/2023  shows:  SR w 1st deg HB, rate 62bpm PR -        TTE, 02/19/2021  1. Left ventricular ejection fraction, by estimation, is 55 to 60%. The left ventricle has normal function. The left ventricle has no regional wall motion abnormalities. Left ventricular diastolic parameters are consistent with Grade I diastolic dysfunction (impaired relaxation).   2. Right ventricular systolic function is mildly reduced. The right ventricular size is normal. Mildly increased right ventricular wall thickness. Tricuspid regurgitation signal is inadequate for assessing PA pressure.   3. The mitral valve was not well visualized. Trivial mitral valve regurgitation. No evidence of mitral stenosis.   4. The aortic valve has an indeterminant number of cusps. There is mild calcification of  the aortic valve. There is moderate thickening of the aortic valve. Aortic valve regurgitation is not visualized. Mild to moderate aortic valve sclerosis/calcification is present, without any evidence of aortic stenosis.   5. The inferior vena cava is dilated in size with >50% respiratory variability, suggesting right atrial pressure of 8 mmHg.    R/LHC, 02/04/2021 Severe two-vessel coronary artery disease, including chronic total occlusion of the proximal LAD and 90-99% distal RCA/rPLA disease.  RPL branches and rPDA fill via left-to-right collaterals. Widely patent LIMA-LAD. Chronically occluded SVG-OM1 and SVG-rPDA. Mildly to moderately reduced left ventricular systolic function (LVEDP 40-45%). Mildly elevated left heart and pulmonary artery pressures. Moderately elevated right heart filling pressures. Normal Fick cardiac output/index.     Assessment and Plan     #) SND s/p PPM Device functioning well, see paceart for details Blunted histograms at LRL, adjusted activity acceleration  #) parox AFib #) amiodarone use No recent afib via device Continue 100mg  amiodarone 5 days per week Update thyroid labs today Recent LFTs stable  #) Hypercoag d/t parox afib CHA2DS2-VASc Score = at least 6 [CHF History: 0, HTN History: 1, Diabetes History: 0, Stroke History: 2, Vascular Disease History: 1, Age Score: 2, Gender Score: 0].  Therefore, the patient's annual risk of stroke is 9.7 %.    Stroke ppx - 5mg  eliquis, appropriately dosed No bleeding concerns        Current medicines are reviewed at length with the patient today.   The patient does not have concerns regarding his medicines.  The following changes were made today:  none  Labs/ tests ordered today include:  Orders Placed This Encounter  Procedures   TSH   T4, free     Disposition: Follow up with Dr. Graciela Husbands or EP APP in 6 months   Signed, Sherie Don, NP  10/15/23  12:22 PM  Electrophysiology [facility]

## 2023-10-15 ENCOUNTER — Other Ambulatory Visit: Payer: Self-pay

## 2023-10-15 ENCOUNTER — Encounter: Payer: Self-pay | Admitting: Cardiology

## 2023-10-15 ENCOUNTER — Ambulatory Visit: Attending: Cardiology | Admitting: Cardiology

## 2023-10-15 VITALS — BP 120/66 | HR 63 | Ht 69.0 in | Wt 202.0 lb

## 2023-10-15 DIAGNOSIS — I480 Paroxysmal atrial fibrillation: Secondary | ICD-10-CM | POA: Diagnosis not present

## 2023-10-15 DIAGNOSIS — I495 Sick sinus syndrome: Secondary | ICD-10-CM | POA: Diagnosis not present

## 2023-10-15 DIAGNOSIS — Z950 Presence of cardiac pacemaker: Secondary | ICD-10-CM | POA: Diagnosis not present

## 2023-10-15 DIAGNOSIS — Z79899 Other long term (current) drug therapy: Secondary | ICD-10-CM

## 2023-10-15 DIAGNOSIS — D6869 Other thrombophilia: Secondary | ICD-10-CM

## 2023-10-15 DIAGNOSIS — R899 Unspecified abnormal finding in specimens from other organs, systems and tissues: Secondary | ICD-10-CM

## 2023-10-15 LAB — CUP PACEART INCLINIC DEVICE CHECK
Date Time Interrogation Session: 20250327122457
Implantable Lead Connection Status: 753985
Implantable Lead Connection Status: 753985
Implantable Lead Implant Date: 20170814
Implantable Lead Implant Date: 20170814
Implantable Lead Location: 753859
Implantable Lead Location: 753860
Implantable Lead Model: 5076
Implantable Lead Model: 5076
Implantable Pulse Generator Implant Date: 20170814

## 2023-10-15 NOTE — Patient Instructions (Signed)
 Medication Instructions:  Your Physician recommend you continue on your current medication as directed.    *If you need a refill on your cardiac medications before your next appointment, please call your pharmacy*   Lab Work: Your provider would like for you to have following labs drawn today TSH and T4.   If you have labs (blood work) drawn today and your tests are completely normal, you will receive your results only by: MyChart Message (if you have MyChart) OR A paper copy in the mail If you have any lab test that is abnormal or we need to change your treatment, we will call you to review the results.   Testing/Procedures: None ordered    Follow-Up: At [facility], you and your health needs are our priority.  As part of our continuing mission to provide you with exceptional heart care, we have created designated Provider Care Teams.  These Care Teams include your primary Cardiologist (physician) and Advanced Practice Providers (APPs -  Physician Assistants and Nurse Practitioners) who all work together to provide you with the care you need, when you need it.  We recommend signing up for the patient portal called "MyChart".  Sign up information is provided on this After Visit Summary.  MyChart is used to connect with patients for Virtual Visits (Telemedicine).  Patients are able to view lab/test results, encounter notes, upcoming appointments, etc.  Non-urgent messages can be sent to your provider as well.   To learn more about what you can do with MyChart, go to [email or web address].    Your next appointment:   6 month(s)  Provider:   Sherryl Manges, MD

## 2023-10-16 LAB — TSH: TSH: 1.55 u[IU]/mL (ref 0.450–4.500)

## 2023-10-16 LAB — T4, FREE: Free T4: 1.57 ng/dL (ref 0.82–1.77)

## 2023-10-19 ENCOUNTER — Other Ambulatory Visit: Payer: Self-pay | Admitting: Cardiovascular Disease

## 2023-10-20 ENCOUNTER — Encounter: Payer: Self-pay | Admitting: Internal Medicine

## 2023-10-21 ENCOUNTER — Telehealth: Payer: Self-pay

## 2023-10-21 ENCOUNTER — Other Ambulatory Visit (INDEPENDENT_AMBULATORY_CARE_PROVIDER_SITE_OTHER)

## 2023-10-21 DIAGNOSIS — R899 Unspecified abnormal finding in specimens from other organs, systems and tissues: Secondary | ICD-10-CM | POA: Diagnosis not present

## 2023-10-21 LAB — CBC WITH DIFFERENTIAL/PLATELET
Basophils Absolute: 0 10*3/uL (ref 0.0–0.1)
Basophils Relative: 0.4 % (ref 0.0–3.0)
Eosinophils Absolute: 0.1 10*3/uL (ref 0.0–0.7)
Eosinophils Relative: 1.1 % (ref 0.0–5.0)
HCT: 34.9 % — ABNORMAL LOW (ref 39.0–52.0)
Hemoglobin: 11.8 g/dL — ABNORMAL LOW (ref 13.0–17.0)
Lymphocytes Relative: 57.3 % — ABNORMAL HIGH (ref 12.0–46.0)
Lymphs Abs: 5.7 10*3/uL — ABNORMAL HIGH (ref 0.7–4.0)
MCHC: 33.8 g/dL (ref 30.0–36.0)
MCV: 100.7 fl — ABNORMAL HIGH (ref 78.0–100.0)
Monocytes Absolute: 0.6 10*3/uL (ref 0.1–1.0)
Monocytes Relative: 6.3 % (ref 3.0–12.0)
Neutro Abs: 3.4 10*3/uL (ref 1.4–7.7)
Neutrophils Relative %: 34.9 % — ABNORMAL LOW (ref 43.0–77.0)
Platelets: 163 10*3/uL (ref 150.0–400.0)
RBC: 3.46 Mil/uL — ABNORMAL LOW (ref 4.22–5.81)
RDW: 15.6 % — ABNORMAL HIGH (ref 11.5–15.5)
WBC: 9.9 10*3/uL (ref 4.0–10.5)

## 2023-10-21 MED ORDER — PANTOPRAZOLE SODIUM 40 MG PO TBEC: 40.0000 mg | DELAYED_RELEASE_TABLET | Freq: Every day | ORAL | 0 refills | Status: DC

## 2023-10-21 NOTE — Telephone Encounter (Signed)
 Medication refilled

## 2023-10-21 NOTE — Telephone Encounter (Signed)
 This message is being sent to the [facility] of the day. Patient is out of the following medication.  Prescription Request  10/21/2023  LOV: Visit date not found  What is the name of the medication or equipment? pantoprazole (PROTONIX) 40 MG tablet  Have you contacted your pharmacy to request a refill? Yes   Which pharmacy would you like this sent to?  [id number] [place], [place] - [address] AT [facility] OF [address] [place] [address] Phone: [phone] Fax: [phone]     Patient notified that their request is being sent to the clinical staff for review and that they should receive a response within 2 business days.   Please advise at Mobile [phone] (mobile)

## 2023-10-27 ENCOUNTER — Telehealth: Payer: Self-pay | Admitting: Cardiovascular Disease

## 2023-10-27 NOTE — Telephone Encounter (Signed)
 Called and spoke with patient. Notified patient of the following from Dr. Mariah Milling.  Not sure what is going on for the past week, typically has well-controlled blood pressure  Would suggest he bring blood pressure cuff in for his office visit so we can verify it with our manual cuff  Typically blood pressure is 120-140  I did notice the Lasix is not on his medication list from last year  Thx  TGollan   Patient verbalizes understanding.

## 2023-10-27 NOTE — Progress Notes (Unsigned)
 Cardiology Office Note:  .   Date:  10/28/2023  ID:  [name], DOB 1946/03/03, MRN [id number] PCP: Aviva Kluver  [facility] Providers Cardiologist:  Julien Nordmann, MD Electrophysiologist:  Sherryl Manges, MD    History of Present Illness: .   [name] is a 78 y.o. male with a past medical history of sinus node dysfunction status post permanent pacemaker placement, coronary artery disease status post CABG, paroxysmal atrial fibrillation, hypertension, COPD, OSA, CLL, chronic HFpEF, CVA, who is here today being seen as a work in for progressive fatigue and elevated BP.   Previously admitted 01/2020 with chest pain.  Troponins negative, suspect musculoskeletal versus GI.  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  Cannot take abdominal aorta and risk of aneurysm development.  Recommended ultrasound repeated in 5 years.  Echocardiogram 02/04/2020 showed an LVEF of 55 to 60%, no RWMA, mild LVH, G1 DD, normal RV size, mildly elevated PASP with RV systolic pressure 44.3 mmHg, mild MR, mild to moderate aortic valve sclerosis without stenosis.  Recommend increase metoprolol to 50 mg daily.  Previous Myoview stress testing showed a medium defect in the mid anterior, mid anteroseptal and apical anterior and apex location consistent with ischemia.  EF mildly decreased 45-54%, overall indeterminate risk.  Also downsloping ST depression in V2 V3 V4 and he was set up for appointment to discuss cardiac catheterization.  He underwent a right and left heart catheterization that showed severe two-vessel CAD, including chronic total occlusion of the pLAD and 90-99% distal RCA/R PLA, which fills via left-right collaterals, widely patent LIMA to LAD, chronically occluded SVG-OM1, and SVG-RPDA, mild to moderate LVEF at 40-45%, mildly elevated pulmonary artery pressures, moderately elevated right heart pressures.  Medical therapy was  recommended if started on furosemide 40 mg daily and Imdur 30 mg daily.   He was last seen in clinic 10/15/2023 by Percell Belt, NP of EP to follow-up on his paroxysmal atrial fibrillation and device check.  Device interrogation completed showed battery life of 2.5 years, no atrial fibrillation, blunted histograms and L RL, reduced activity acceleration and no further changes that were made.  Thyroid labs were updated and he was continued on it 100 mg of amiodarone 5 days/week.  He returns to clinic today after calling in stating that he had elevated blood pressures and worsening fatigue.  He had also recently been treated for community-acquired pneumonia.  His pneumonia was approximately 3 weeks ago.  He continues to suffer from fatigue and he states that his shortness of breath has worsened.  He states he always has shortness of breath because of his COPD but of late he has noted increasing with minimal exertion.  He has been compliant with his current medication and has not missed any doses of his apixaban and has had no bleeding.  He has been compliant with his current medication regimen.  His concern was as well when he was taking his blood pressure his blood pressure was elevated from 1 arm to the next.  He brought his home cuff in with him today.  Denies any recent hospitalizations or visits to the emergency department since last being seen in clinic.  ROS: 10 point review of system has been reviewed and considered negative except ones been listed in the HPI  Studies Reviewed: Marland Kitchen   EKG Interpretation Date/Time:  Wednesday October 28 2023 08:52:40 EDT Ventricular Rate:  60 PR Interval:  298 QRS Duration:  96 QT Interval:  468 QTC Calculation: 468 R Axis:   43  Text Interpretation: Atrial-paced rhythm with prolonged AV conduction Nonspecific ST and T wave abnormality When compared with ECG of 28-Oct-2023 08:41, ST no longer depressed in Inferior leads ST less depressed in Anterolateral leads  Nonspecific T wave abnormality no longer evident in Inferior leads T wave inversion less evident in Anterior leads Confirmed by Charlsie Quest ([id number]) on 10/28/2023 8:54:26 AM    TTE, 02/19/2021  1. Left ventricular ejection fraction, by estimation, is 55 to 60%. The left ventricle has normal function. The left ventricle has no regional wall motion abnormalities. Left ventricular diastolic parameters are consistent with Grade I diastolic dysfunction (impaired relaxation).   2. Right ventricular systolic function is mildly reduced. The right ventricular size is normal. Mildly increased right ventricular wall thickness. Tricuspid regurgitation signal is inadequate for assessing PA pressure.   3. The mitral valve was not well visualized. Trivial mitral valve regurgitation. No evidence of mitral stenosis.   4. The aortic valve has an indeterminant number of cusps. There is mild calcification of the aortic valve. There is moderate thickening of the aortic valve. Aortic valve regurgitation is not visualized. Mild to moderate aortic valve sclerosis/calcification is present, without any evidence of aortic stenosis.   5. The inferior vena cava is dilated in size with >50% respiratory variability, suggesting right atrial pressure of 8 mmHg.    R/LHC, 02/04/2021 Severe two-vessel coronary artery disease, including chronic total occlusion of the proximal LAD and 90-99% distal RCA/rPLA disease. RPL branches and rPDA fill via left-to-right collaterals. Widely patent LIMA-LAD. Chronically occluded SVG-OM1 and SVG-rPDA. Mildly to moderately reduced left ventricular systolic function (LVEDP 40-45%). Mildly elevated left heart and pulmonary artery pressures. Moderately elevated right heart filling pressures. Normal Fick cardiac output/index.     Risk Assessment/Calculations:    CHA2DS2-VASc Score = 6   This indicates a 9.7% annual risk of stroke. The patient's score is based upon: CHF History: 0 HTN History:  1 Diabetes History: 0 Stroke History: 2 Vascular Disease History: 1 Age Score: 2 Gender Score: 0            Physical Exam:   VS:  BP 138/70   Pulse 60   Ht 5\' 9"  (1.753 m)   Wt 205 lb (93 kg)   SpO2 98%   BMI 30.27 kg/m    Wt Readings from Last 3 Encounters:  10/28/23 205 lb (93 kg)  10/15/23 202 lb (91.6 kg)  10/13/23 201 lb (91.2 kg)    GEN: Well nourished, well developed in no acute distress NECK: No JVD; No carotid bruits CARDIAC: RRR, no murmurs, rubs, gallops RESPIRATORY:  Clear with diminished bases to auscultation without rales, wheezing or rhonchi, respirations are unlabored at rest on room air ABDOMEN: Soft, non-tender, non-distended EXTREMITIES: Pretibial edema; No deformity   ASSESSMENT AND PLAN: .   Shortness of breath and dyspnea on exertion that he stated started to worsen prior to his pneumonia diagnoses but is gradually continued even with treatment of his pneumonia.  Last echocardiogram was completed in 2022 he has been scheduled for an updated study to rule out any decreased function or valvular changes that could be causing his worsened shortness of breath.  Persistent atrial fibrillation and atrial flutter who is a paced on EKG today with a rate of 60 with continued T wave inversions but no acute changes noted today.  He is continued on amiodarone 100 mg twice daily for 5 days out  of the week.  He is on apixaban 5 mg twice daily for CHA2DS2-VASc score of 6 for stroke prophylaxis without any current concerns for bleeding.  Last hemoglobin was stable at 11.8.  Coronary artery disease status post CABG with catheterization in March 2011 revealed occluded vein graft x 2 and patent LIMA with collaterals from the left to the right.  He denies any chest pain.  Is continued on apixaban and lieu of aspirin 5 mg twice daily and Repatha 140 mg every 2 weeks as well as ezetimibe and 10 mg daily.  EKG today reveals no new ischemic changes.  Primary hypertension with blood  pressure today 138/70.  Blood pressure remained stable.  We did check blood pressures with his home cuff as relation to blood pressures in the office he was advised on proper use of the cuff encouraged to continue to monitor his pressure 1 to 2 hours post medications.  Hyperlipidemia where he is continued on Repatha 140 mg every 2-week injection and ezetimibe 10 mg daily.  Last LDL was 19  Sinus node dysfunction status post permanent pacemaker placement will recent follow-up with EP and device interrogation revealed that his device was functioning well.  Continue with follow-up from EP.  Chronic HFpEF where he has progressive shortness of breath dyspnea on exertion and worsening fatigue.  Appears to be euvolemic on exam.  Scheduled for an updated echocardiogram due to shortness of breath.  Community-acquired pneumonia where he has recently undergone treatment.  Has followed up with pulmonary.  And has an upcoming CT of the chest scheduled.  History of CLL where he continues to be followed by oncology  Obstructive sleep apnea where he states he is compliant with CPAP  COPD that is not currently in exacerbation continues to be followed by pulmonary.       Dispo: Patient return to clinic to see MD/APP in 3 months or sooner if needed for reevaluation of symptoms.  Signed, Makiya Jeune, NP

## 2023-10-27 NOTE — Telephone Encounter (Signed)
 Patient reports that his blood pressure at home has been fluctuating for the past week. He states that he is not sure if his home cuff is working properly. He reports that this morning his blood pressure was around 180/90. He states that he has taken all of his medications has prescribed. He reports feeling fatigued. I checked patient's blood pressure while in the office and it was 158/70. Advised patient to continue to monitor his blood pressures at home. He has been scheduled for an appointment with Frutoso Schatz, NP for tomorrow morning to discuss his concerns.

## 2023-10-27 NOTE — Telephone Encounter (Signed)
 Pt c/o BP issue: STAT if pt c/o blurred vision, one-sided weakness or slurred speech.  STAT if BP is GREATER than 180/120 TODAY.  STAT if BP is LESS than 90/60 and SYMPTOMATIC TODAY  1. What is your BP concern? High BP   2. Have you taken any BP medication today?yes  3. What are your last 5 BP readings?190/80, 170/66 not sure because reading are "all over the place".  4. Are you having any other symptoms (ex. Dizziness, headache, blurred vision, passed out)? Fatigue, weakness  Patient is waiting in the lobby and is requesting to have his blood pressure checked please.

## 2023-10-28 ENCOUNTER — Encounter: Payer: Self-pay | Admitting: Cardiology

## 2023-10-28 ENCOUNTER — Ambulatory Visit: Attending: Cardiology | Admitting: Cardiology

## 2023-10-28 VITALS — BP 138/70 | HR 60 | Ht 69.0 in | Wt 205.0 lb

## 2023-10-28 DIAGNOSIS — Z950 Presence of cardiac pacemaker: Secondary | ICD-10-CM | POA: Diagnosis not present

## 2023-10-28 DIAGNOSIS — I5032 Chronic diastolic (congestive) heart failure: Secondary | ICD-10-CM

## 2023-10-28 DIAGNOSIS — E7800 Pure hypercholesterolemia, unspecified: Secondary | ICD-10-CM

## 2023-10-28 DIAGNOSIS — R0609 Other forms of dyspnea: Secondary | ICD-10-CM

## 2023-10-28 DIAGNOSIS — I2510 Atherosclerotic heart disease of native coronary artery without angina pectoris: Secondary | ICD-10-CM

## 2023-10-28 DIAGNOSIS — I480 Paroxysmal atrial fibrillation: Secondary | ICD-10-CM | POA: Diagnosis not present

## 2023-10-28 DIAGNOSIS — J432 Centrilobular emphysema: Secondary | ICD-10-CM

## 2023-10-28 DIAGNOSIS — J189 Pneumonia, unspecified organism: Secondary | ICD-10-CM

## 2023-10-28 DIAGNOSIS — C9110 Chronic lymphocytic leukemia of B-cell type not having achieved remission: Secondary | ICD-10-CM

## 2023-10-28 DIAGNOSIS — G4733 Obstructive sleep apnea (adult) (pediatric): Secondary | ICD-10-CM

## 2023-10-28 DIAGNOSIS — I10 Essential (primary) hypertension: Secondary | ICD-10-CM

## 2023-10-28 DIAGNOSIS — I495 Sick sinus syndrome: Secondary | ICD-10-CM

## 2023-10-28 MED ORDER — APIXABAN 5 MG PO TABS: 5.0000 mg | ORAL_TABLET | Freq: Two times a day (BID) | ORAL | 3 refills | Status: AC

## 2023-10-28 NOTE — Patient Instructions (Signed)
 Medication Instructions:  Your physician recommends the following medication changes.   START TAKING: Eliquis 5 Mg Twice a day  *If you need a refill on your cardiac medications before your next appointment, please call your pharmacy*  Lab Work: No labs ordered today  If you have labs (blood work) drawn today and your tests are completely normal, you will receive your results only by: MyChart Message (if you have MyChart) OR A paper copy in the mail If you have any lab test that is abnormal or we need to change your treatment, we will call you to review the results.  Testing/Procedures: Your physician has requested that you have an echocardiogram. Echocardiography is a painless test that uses sound waves to create images of your heart. It provides your doctor with information about the size and shape of your heart and how well your heart's chambers and valves are working.   You may receive an ultrasound enhancing agent through an IV if needed to better visualize your heart during the echo. This procedure takes approximately one hour.  There are no restrictions for this procedure.  This will take place at [address] [facility], [place] [address]  Please note: We ask at that you not bring children with you during ultrasound (echo/ vascular) testing. Due to room size and safety concerns, children are not allowed in the ultrasound rooms during exams. Our front office staff cannot provide observation of children in our lobby area while testing is being conducted. An adult accompanying a patient to their appointment will only be allowed in the ultrasound room at the discretion of the ultrasound technician under special circumstances. We apologize for any inconvenience.   Follow-Up: At [facility], you and your health needs are our priority.  As part of our continuing mission to provide you with exceptional heart care, our providers are all part of one team.   This team includes your primary Cardiologist (physician) and Advanced Practice Providers or APPs (Physician Assistants and Nurse Practitioners) who all work together to provide you with the care you need, when you need it.  Your next appointment:   3 month(s)  Provider:   You may see Julien Nordmann, MD or one of the following Advanced Practice Providers on your designated Care Team:   Nicolasa Ducking, NP Ames Dura, PA-C Eula Listen, PA-C Cadence Ames, PA-C Charlsie Quest, NP Carlos Levering, NP

## 2023-11-03 ENCOUNTER — Ambulatory Visit: Payer: Medicare HMO

## 2023-11-03 ENCOUNTER — Other Ambulatory Visit: Payer: Self-pay

## 2023-11-03 DIAGNOSIS — I495 Sick sinus syndrome: Secondary | ICD-10-CM | POA: Diagnosis not present

## 2023-11-04 ENCOUNTER — Other Ambulatory Visit: Payer: Self-pay

## 2023-11-04 ENCOUNTER — Other Ambulatory Visit: Payer: Self-pay | Admitting: Pharmacy Technician

## 2023-11-04 NOTE — Progress Notes (Signed)
 Specialty Pharmacy Refill Coordination Note  [name] is a 78 y.o. male contacted today regarding refills of specialty medication(s) Acalabrutinib Maleate (Calquence)   Patient requested (Patient-Rptd) Delivery   Delivery date: 11/24/23   Verified address: (Patient-Rptd) [id number] [place] [id number]   Medication will be filled on 11/23/22.

## 2023-11-05 LAB — CUP PACEART REMOTE DEVICE CHECK
Battery Remaining Longevity: 31 mo
Battery Voltage: 2.94 V
Brady Statistic AP VP Percent: 5.12 %
Brady Statistic AP VS Percent: 80.25 %
Brady Statistic AS VP Percent: 0.04 %
Brady Statistic AS VS Percent: 14.59 %
Brady Statistic RA Percent Paced: 85.39 %
Brady Statistic RV Percent Paced: 5.32 %
Date Time Interrogation Session: 20250415134544
Implantable Lead Connection Status: 753985
Implantable Lead Connection Status: 753985
Implantable Lead Implant Date: 20170814
Implantable Lead Implant Date: 20170814
Implantable Lead Location: 753859
Implantable Lead Location: 753860
Implantable Lead Model: 5076
Implantable Lead Model: 5076
Implantable Pulse Generator Implant Date: 20170814
Lead Channel Impedance Value: 323 Ohm
Lead Channel Impedance Value: 418 Ohm
Lead Channel Impedance Value: 437 Ohm
Lead Channel Impedance Value: 494 Ohm
Lead Channel Pacing Threshold Amplitude: 0.625 V
Lead Channel Pacing Threshold Amplitude: 0.625 V
Lead Channel Pacing Threshold Pulse Width: 0.4 ms
Lead Channel Pacing Threshold Pulse Width: 0.4 ms
Lead Channel Sensing Intrinsic Amplitude: 2.625 mV
Lead Channel Sensing Intrinsic Amplitude: 2.625 mV
Lead Channel Sensing Intrinsic Amplitude: 20.125 mV
Lead Channel Sensing Intrinsic Amplitude: 20.125 mV
Lead Channel Setting Pacing Amplitude: 2 V
Lead Channel Setting Pacing Amplitude: 2.5 V
Lead Channel Setting Pacing Pulse Width: 0.4 ms
Lead Channel Setting Sensing Sensitivity: 0.9 mV
Zone Setting Status: 755011
Zone Setting Status: 755011

## 2023-11-09 ENCOUNTER — Encounter: Payer: Self-pay | Admitting: Internal Medicine

## 2023-11-09 ENCOUNTER — Ambulatory Visit (INDEPENDENT_AMBULATORY_CARE_PROVIDER_SITE_OTHER): Payer: Non-veteran care | Admitting: Internal Medicine

## 2023-11-09 VITALS — BP 136/60 | HR 58 | Temp 98.0°F | Ht 69.0 in | Wt 203.0 lb

## 2023-11-09 DIAGNOSIS — G4733 Obstructive sleep apnea (adult) (pediatric): Secondary | ICD-10-CM

## 2023-11-09 DIAGNOSIS — Z87891 Personal history of nicotine dependence: Secondary | ICD-10-CM | POA: Diagnosis not present

## 2023-11-09 DIAGNOSIS — I4891 Unspecified atrial fibrillation: Secondary | ICD-10-CM

## 2023-11-09 DIAGNOSIS — J449 Chronic obstructive pulmonary disease, unspecified: Secondary | ICD-10-CM

## 2023-11-09 NOTE — Patient Instructions (Addendum)
 Continue Advair as prescribed Please rinse mouth after every use Use albuterol  and nebulizer treatments as needed  Excellent Job A+ GOLD STAR!!  Continue CPAP as prescribed  Avoid Allergens and Irritants Avoid secondhand smoke Avoid SICK contacts Recommend  Masking  when appropriate Recommend Keep up-to-date with vaccinations   Be aware of reduced alertness and do not drive or operate heavy machinery if experiencing this or drowsiness.  Exercise encouraged, as tolerated. Encouraged proper weight management.  Important to get eight or more hours of sleep  Limiting the use of the computer and television before bedtime.  Decrease naps during the day, so night time sleep will become enhanced.  Limit caffeine, and sleep deprivation.   Obtain pulmonary function testing to assess breathing Follow-up with Dr. Gollan cardiology

## 2023-11-09 NOTE — Progress Notes (Signed)
 SYNOPSIS 78 year old male, former smoker. PMH significant for COPD, OSA. Former patient of Dr. Woodfin Hays, last seen 01/18/2018. Maintained on CPAP for sleep apnea.     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 Probable mild obstruction (FEV1 70% pred) with gas trapping (RV 134% pred) and mild-mod decrease in DLCO PSG (07/25/15): Overall AHI < 10/hr but > 100/hr in supine position. ROV (07/30/15): Dyspnea on exertion is moderately improved with scheduled use of Symbicort . He is also on Spiriva , PRN albuterol  with rare use of albuterol  MDI. PSG results not available. Continue same COPD regimen CPAP titration (08/29/15): recommend full mask with CPAP 8 cm H2O CPAP compliance 11/26-12/25/18: Usage 30/30. Greater than 4 hrs: 27/30.  CPAP 8 cm PREVIOUS AHI 44   CC Follow-up assessment COPD Follow-up assessment for OSA     HPI Regarding COPD No exacerbation at this time No evidence of heart failure at this time No evidence or signs of infection at this time No respiratory distress No fevers, chills, nausea, vomiting, diarrhea No evidence of lower extremity edema No evidence hemoptysis  Current Regimen Current inhaler usage is Advair Uses albuterol  nebulizer as needed Intermittent albuterol  use   Regarding OSA 5-6 out of sleep at night Fullface mask Current download reviewed in detail with patient Patient uses and benefits from therapy Using CPAP nightly and with naps Pressure setting is comfortable and is sleeping well. CPAP 8 cm Current AHI 2.2 Previous AHI 44   Patient with atrial fibrillation status post pacemaker Continue amiodarone  therapy Continue oral anticoagulation therapy pulmonary function testing to assess for restrictive lung  disease /COPD   Counseling given: Not Answered   Outpatient Medications Prior to Visit  Medication Sig Dispense Refill   acalabrutinib  maleate (CALQUENCE ) 100 MG tablet Take 1 tablet (100 mg total) by mouth 2 (two) times daily. 60 tablet 2   acetaminophen  (TYLENOL ) 325 MG tablet Take 1-2 tablets (325-650 mg total) by mouth every 4 (four) hours as needed for mild pain.     albuterol  (VENTOLIN  HFA) 108 (90 Base) MCG/ACT inhaler Inhale 2 puffs into the lungs every 6 (six) hours as needed for wheezing or shortness of breath. 1 each 0   amiodarone  (PACERONE ) 200 MG tablet TAKE 1/2 TABLET BY MOUTH TWICE DAILY FOR 5 DAYS A WEEK 60 tablet 0   apixaban  (ELIQUIS ) 5 MG TABS tablet Take 1 tablet (5 mg total) by mouth 2 (two) times daily. 90 tablet 3   Apple Cider Vinegar 500 MG TABS Take 1 tablet by mouth daily.     azelastine  (ASTELIN ) 0.1 % nasal spray Place 2 sprays into both nostrils 2 (two) times daily. Use in each nostril as directed 30 mL 1   cetirizine (ZYRTEC) 10 MG tablet Take 10 mg by mouth daily as needed for allergies.      Cholecalciferol (VITAMIN D3) 250 MCG (10000 UT) capsule Take 10,000 Units by mouth daily.     Coenzyme Q10 (COQ10) 200 MG CAPS Take 200 mg by mouth daily.     cyanocobalamin  (VITAMIN B12) 1000 MCG tablet Take 1,000 mcg by mouth daily.     ezetimibe  (ZETIA ) 10 MG tablet Take 1 tablet (10 mg total) by mouth daily. (Patient taking differently: Take 10 mg by mouth every morning.)  90 tablet 3   ferrous sulfate  325 (65 FE) MG EC tablet Take 325 mg by mouth daily.     fluticasone -salmeterol (WIXELA INHUB) 100-50 MCG/ACT AEPB Inhale 2 puffs into the lungs 2 (two) times daily.     ipratropium-albuterol  (DUONEB) 0.5-2.5 (3) MG/3ML SOLN Take 3 mLs by nebulization every 6 (six) hours as needed (shorntess of breath). 360 mL 0   mirtazapine  (REMERON ) 15 MG tablet Take 15 mg by mouth at bedtime as needed (for panic associated with PTSD).     mometasone -formoterol  (DULERA ) 200-5 MCG/ACT  AERO Inhale 2 puffs into the lungs 2 (two) times daily.     Multiple Vitamin (MULTIVITAMIN WITH MINERALS) TABS tablet Take 1 tablet by mouth daily.     pantoprazole  (PROTONIX ) 40 MG tablet Take 1 tablet (40 mg total) by mouth daily. 90 tablet 0   REPATHA  SURECLICK 140 MG/ML SOAJ ADMINISTER 1 ML UNDER THE SKIN EVERY 14 DAYS 2 mL 11   tamsulosin  (FLOMAX ) 0.4 MG CAPS capsule TAKE 1 CAPSULE(0.4 MG) BY MOUTH DAILY 30 capsule 3   Tiotropium Bromide  Monohydrate 2.5 MCG/ACT AERS Inhale 2 puffs into the lungs at bedtime. Spiriva      tiZANidine  (ZANAFLEX ) 4 MG capsule Take 1 capsule (4 mg total) by mouth 3 (three) times daily. This can make you sleepy. (Patient not taking: Reported on 10/28/2023) 60 capsule 0   traMADol  (ULTRAM ) 50 MG tablet Take 50 mg by mouth 2 (two) times daily.     Facility-Administered Medications Prior to Visit  Medication Dose Route Frequency Provider Last Rate Last Admin   potassium chloride  10 mEq in 100 mL IVPB  10 mEq Intravenous Once Borders, Melodie Spry R, NP        BP 136/60 (BP Location: Left Arm, Patient Position: Sitting, Cuff Size: Normal)   Pulse (!) 58   Temp 98 F (36.7 C) (Temporal)   Ht 5\' 9"  (1.753 m)   Wt 203 lb (92.1 kg)   SpO2 98%   BMI 29.98 kg/m    Review of Systems: Gen:  Denies  fever, sweats, chills weight loss  HEENT: Denies blurred vision, double vision, ear pain, eye pain, hearing loss, nose bleeds, sore throat Cardiac:  No dizziness, chest pain or heaviness, chest tightness,edema, No JVD Resp:   No cough, -sputum production, +shortness of breath,-wheezing, -hemoptysis,  Other:  All other systems negative   Physical Examination:   General Appearance: No distress  EYES PERRLA, EOM intact.   NECK Supple, No JVD Pulmonary: normal breath sounds, No wheezing.  CardiovascularNormal S1,S2.  No m/r/g.   Abdomen: Benign, Soft, non-tender. Neurology UE/LE 5/5 strength, no focal deficits Ext pulses intact, cap refill intact ALL OTHER ROS ARE  NEGATIVE     Lab Results:  CBC    Component Value Date/Time   WBC 9.9 10/21/2023 0932   RBC 3.46 (L) 10/21/2023 0932   HGB 11.8 (L) 10/21/2023 0932   HGB 11.9 (L) 09/15/2023 1040   HGB 12.5 (L) 03/11/2021 1136   HCT 34.9 (L) 10/21/2023 0932   HCT 36.9 (L) 03/11/2021 1136   PLT 163.0 10/21/2023 0932   PLT 144 (L) 09/15/2023 1040   PLT 159 03/11/2021 1136   MCV 100.7 (H) 10/21/2023 0932   MCV 100 (H) 03/11/2021 1136   MCV 95 10/24/2014 0837   MCH 33.1 10/06/2023 1040   MCHC 33.8 10/21/2023 0932   RDW 15.6 (H) 10/21/2023 0932   RDW 13.5 03/11/2021 1136   RDW 14.6 (H) 10/24/2014 0837   LYMPHSABS 5.7 (  H) 10/21/2023 0932   LYMPHSABS 0.8 (L) 10/24/2014 0837   MONOABS 0.6 10/21/2023 0932   MONOABS 0.6 10/24/2014 0837   EOSABS 0.1 10/21/2023 0932   EOSABS 0.2 10/24/2014 0837   BASOSABS 0.0 10/21/2023 0932   BASOSABS 0.0 10/24/2014 0837    BMET    Component Value Date/Time   NA 140 10/06/2023 1040   NA 143 03/11/2021 1136   NA 139 10/11/2014 1800   K 3.9 10/06/2023 1040   K 3.3 (L) 10/11/2014 1800   CL 108 10/06/2023 1040   CL 106 10/11/2014 1800   CO2 23 10/06/2023 1040   CO2 27 10/11/2014 1800   GLUCOSE 116 (H) 10/06/2023 1040   GLUCOSE 107 (H) 10/11/2014 1800   BUN 27 (H) 10/06/2023 1040   BUN 13 03/11/2021 1136   BUN 15 10/11/2014 1800   CREATININE 0.80 10/06/2023 1040   CREATININE 0.97 09/15/2023 1040   CREATININE 0.89 10/11/2014 1800   CALCIUM  8.5 (L) 10/06/2023 1040   CALCIUM  8.8 (L) 10/11/2014 1800   GFRNONAA >60 10/06/2023 1040   GFRNONAA >60 09/15/2023 1040   GFRNONAA >60 10/11/2014 1800   GFRAA >60 04/12/2020 0959   GFRAA >60 10/11/2014 1800      ASSESSMENT AND PLAN 78 year old former smoker follow-up assessment for COPD OSA Patient with atrial fibrillation on amiodarone  therapy  Assessment of COPD Stable No exacerbations Inhaler regimen reviewed in detail-continue Advair as prescribed Rinse mouth after every use Albuterol  nebulizers as  needed Patient with moderate COPD with FEV1 60% predicted Repeat PFT's needed for interval changes   Assessment of Sleep apnea Patient has excellent compliance report Discussed in detail with patient OSA is well-controlled with CPAP Continue current prescription Patient uses and benefits from therapy Using CPAP nightly.  pressure setting is comfortable and she is sleeping well. CPAP 8 AHI reduced to 2.2  Patient Instructions Continue to use CPAP every night, minimum of 4-6 hours a night.  Change equipment every 30 days or as directed by DME.  Wash your tubing with warm soap and water  daily, hang to dry. Wash humidifier portion weekly. Use bottled, distilled water  and change daily   Be aware of reduced alertness and do not drive or operate heavy machinery if experiencing this or drowsiness.  Exercise encouraged, as tolerated. Encouraged proper weight management.  Important to get eight or more hours of sleep  Limiting the use of the computer and television before bedtime.  Decrease naps during the day, so night time sleep will become enhanced.  Limit caffeine, and sleep deprivation.  HTN, stroke, uncontrolled diabetes and heart failure are potential risk factors.  Risk of untreated sleep apnea including cardiac arrhthymias, stroke, DM, pulm HTN.  OSA well-controlled Patient uses nasal pillows   A. fib RVR with amiodarone  therapy Recommend obtaining pulmonary function testing to assess for restrictive lung disease Follow-up cardiology as scheduled Obtain PFTs to assess for restrictive lung disease   MEDICATION ADJUSTMENTS/LABS AND TESTS ORDERED: Continue Inhalers as prescribed Continue CPAP as prescribed Follow-up cardiology as scheduled Obtain PFT's  Avoid Allergens and Irritants Avoid secondhand smoke Avoid SICK contacts Recommend  Masking  when appropriate Recommend Keep up-to-date with vaccinations   CURRENT MEDICATIONS REVIEWED AT LENGTH WITH PATIENT  TODAY   Patient satisfied with Plan of action and management. All questions answered  Follow-up in 6 months  Total time spent 41 mins   Alesia Oshields Nestora Baptise, M.D.  [facility]  Medical Director [facility] Medical Director [facility] Cardio-Pulmonary Department

## 2023-11-11 ENCOUNTER — Ambulatory Visit
Admission: RE | Admit: 2023-11-11 | Discharge: 2023-11-11 | Disposition: A | Discharge status: Home / Self Care | Source: Ambulatory Visit | Attending: Nurse Practitioner | Admitting: Nurse Practitioner

## 2023-11-11 DIAGNOSIS — J189 Pneumonia, unspecified organism: Secondary | ICD-10-CM | POA: Diagnosis present

## 2023-11-11 MED ORDER — IOHEXOL 300 MG/ML  SOLN
75.0000 mL | Freq: Once | INTRAMUSCULAR | Status: AC | PRN
  Administered 2023-11-11: 75 mL via INTRAVENOUS

## 2023-11-16 ENCOUNTER — Encounter: Payer: Self-pay | Admitting: Internal Medicine

## 2023-11-16 ENCOUNTER — Inpatient Hospital Stay: Payer: Non-veteran care | Attending: Internal Medicine

## 2023-11-16 ENCOUNTER — Inpatient Hospital Stay (HOSPITAL_BASED_OUTPATIENT_CLINIC_OR_DEPARTMENT_OTHER): Payer: Non-veteran care | Admitting: Internal Medicine

## 2023-11-16 ENCOUNTER — Telehealth: Payer: Self-pay | Admitting: Internal Medicine

## 2023-11-16 VITALS — BP 159/75 | HR 65 | Temp 96.9°F | Resp 18 | Wt 205.0 lb

## 2023-11-16 DIAGNOSIS — C9112 Chronic lymphocytic leukemia of B-cell type in relapse: Secondary | ICD-10-CM | POA: Diagnosis present

## 2023-11-16 DIAGNOSIS — J449 Chronic obstructive pulmonary disease, unspecified: Secondary | ICD-10-CM | POA: Diagnosis not present

## 2023-11-16 DIAGNOSIS — Z87891 Personal history of nicotine dependence: Secondary | ICD-10-CM | POA: Diagnosis not present

## 2023-11-16 DIAGNOSIS — D649 Anemia, unspecified: Secondary | ICD-10-CM | POA: Insufficient documentation

## 2023-11-16 DIAGNOSIS — Z7901 Long term (current) use of anticoagulants: Secondary | ICD-10-CM | POA: Diagnosis not present

## 2023-11-16 DIAGNOSIS — C9110 Chronic lymphocytic leukemia of B-cell type not having achieved remission: Secondary | ICD-10-CM

## 2023-11-16 DIAGNOSIS — I4891 Unspecified atrial fibrillation: Secondary | ICD-10-CM | POA: Insufficient documentation

## 2023-11-16 DIAGNOSIS — Z79899 Other long term (current) drug therapy: Secondary | ICD-10-CM | POA: Diagnosis not present

## 2023-11-16 LAB — CMP (CANCER CENTER ONLY)
ALT: 15 U/L (ref 0–44)
AST: 17 U/L (ref 15–41)
Albumin: 4.1 g/dL (ref 3.5–5.0)
Alkaline Phosphatase: 72 U/L (ref 38–126)
Anion gap: 8 (ref 5–15)
BUN: 22 mg/dL (ref 8–23)
CO2: 24 mmol/L (ref 22–32)
Calcium: 8.5 mg/dL — ABNORMAL LOW (ref 8.9–10.3)
Chloride: 105 mmol/L (ref 98–111)
Creatinine: 0.92 mg/dL (ref 0.61–1.24)
GFR, Estimated: >60 mL/min (ref >60)
Glucose, Bld: 113 mg/dL — ABNORMAL HIGH (ref 70–99)
Potassium: 3.8 mmol/L (ref 3.5–5.1)
Sodium: 137 mmol/L (ref 135–145)
Total Bilirubin: 0.9 mg/dL (ref 0.0–1.2)
Total Protein: 6.9 g/dL (ref 6.5–8.1)

## 2023-11-16 LAB — LACTATE DEHYDROGENASE: LDH: 142 U/L (ref 98–192)

## 2023-11-16 LAB — CBC WITH DIFFERENTIAL (CANCER CENTER ONLY)
Abs Immature Granulocytes: 0.09 10*3/uL — ABNORMAL HIGH (ref 0.00–0.07)
Basophils Absolute: 0 10*3/uL (ref 0.0–0.1)
Basophils Relative: 0 %
Eosinophils Absolute: 0.1 10*3/uL (ref 0.0–0.5)
Eosinophils Relative: 1 %
HCT: 35.6 % — ABNORMAL LOW (ref 39.0–52.0)
Hemoglobin: 11.6 g/dL — ABNORMAL LOW (ref 13.0–17.0)
Immature Granulocytes: 1 %
Lymphocytes Relative: 46 %
Lymphs Abs: 4.5 10*3/uL — ABNORMAL HIGH (ref 0.7–4.0)
MCH: 33.6 pg (ref 26.0–34.0)
MCHC: 32.6 g/dL (ref 30.0–36.0)
MCV: 103.2 fL — ABNORMAL HIGH (ref 80.0–100.0)
Monocytes Absolute: 0.6 10*3/uL (ref 0.1–1.0)
Monocytes Relative: 6 %
Neutro Abs: 4.5 10*3/uL (ref 1.7–7.7)
Neutrophils Relative %: 46 %
Platelet Count: 135 10*3/uL — ABNORMAL LOW (ref 150–400)
RBC: 3.45 MIL/uL — ABNORMAL LOW (ref 4.22–5.81)
RDW: 14.6 % (ref 11.5–15.5)
WBC Count: 9.8 10*3/uL (ref 4.0–10.5)
nRBC: 0 % (ref 0.0–0.2)

## 2023-11-16 LAB — FERRITIN: Ferritin: 78 ng/mL (ref 24–336)

## 2023-11-16 LAB — RETICULOCYTES
Immature Retic Fract: 13.3 % (ref 2.3–15.9)
RBC.: 3.43 MIL/uL — ABNORMAL LOW (ref 4.22–5.81)
Retic Count, Absolute: 75.1 10*3/uL (ref 19.0–186.0)
Retic Ct Pct: 2.2 % (ref 0.4–3.1)

## 2023-11-16 LAB — IRON AND TIBC
Iron: 107 ug/dL (ref 45–182)
Saturation Ratios: 30 % (ref 17.9–39.5)
TIBC: 363 ug/dL (ref 250–450)
UIBC: 256 ug/dL

## 2023-11-16 LAB — VITAMIN B12: Vitamin B-12: 1039 pg/mL — ABNORMAL HIGH (ref 180–914)

## 2023-11-16 NOTE — Telephone Encounter (Signed)
 Per secure chat:   2 month- MD; labs- cbc/cmp; LDH; B12   Appointment scheduled as above

## 2023-11-16 NOTE — Progress Notes (Signed)
 Patient here for follow up. Patient has concerns with bruises on back of lower extremities.

## 2023-11-16 NOTE — Progress Notes (Signed)
  [facility] OFFICE PROGRESS NOTE  Patient Care Team: Pcp, No as PCP - General Jerelene Monday Deadra Everts, MD as PCP - Cardiology (Cardiology) Verona Goodwill, MD as PCP - Electrophysiology (Cardiology) Devorah Fonder, MD as Consulting Physician (Cardiology) Gwyn Leos, MD as Medical Oncologist (Hematology and Oncology) Queenie Brunet, RN as Registered Nurse (Oncology)   Cancer Staging  No matching staging information was found for the patient.    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  MARCH 2017- CT N/C/A/P- NED. STOP Ritux; surveillance   # AUG 2019- CT/PET- progression/NO transformation;  # NOV 2019- Progression; started Ibrutinib  420 mg/d. STOPPED in end of feb sec to extreme fatigue/joint pains/cramps  #November 01, 2018-start ibrutinib  280 mg a day; December 20, 2018-discontinue ibrutinib  secondary multiple side effects.   #January 03, 2019 start Gazyva  + Ven [July]; finished Dec 2020-; started Ven maintenance [200 mg a day; DI-amiodarone ]; SEP 2022- congestive heart failure; s/p cardiac cath-noted to have CAD however no stents placed.  2D echo- EF-55%  #Mid March 2021-venetoclax  dose reduced to 100 mg [second diarrhea]. HELD in DEC 2022/JAN 2023 x3 months-multiple admissions to the hospital/pneumonia infection stroke x 3 m.   # MAY 2023-progression of disease in the neck ches; Nov 18, 2021-RE-started venetoclax ;   # JUNE 29th, 2023-progression of disease in the neck- STOP VENAOCLAX;   # AUG 16th, 2023 START ACALABRUTINIB  100 mg BID [previous intolerance to ibrutinib ; delayed sec to C.diff]    #? 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 Ambulating with cane.    JIBRIL ERHARDT 78 y.o.  male CLL-high risk of recurrent/relapsed currently on  alcabrutinib; afib on eliquis , COPD-  is here for a follow up.  In March 2025- patient had a CTA chest is negative for PE, does show nodule with surrounding inflammatory changes concerning for pneumonia- s/p treatment with Moxifloxacin .   Patient has concerns with bruises on back of lower extremities    No night sweats.  Appetite is normal. Energy is up and down. Complains of chronic joint pain.  Otherwise no fever no chills.  No nausea no vomiting.  No headache.  Review of Systems  Constitutional:  Positive for malaise/fatigue. Negative for chills, diaphoresis and fever.  HENT:  Negative for nosebleeds and sore throat.   Eyes:  Negative for double vision.  Respiratory:  Negative for hemoptysis, shortness of breath and wheezing.   Cardiovascular:  Negative for chest pain, palpitations, orthopnea and leg swelling.  Gastrointestinal:  Negative for abdominal pain, blood in stool, constipation, heartburn, melena, nausea and vomiting.  Genitourinary:  Negative for dysuria, frequency and urgency.  Musculoskeletal:  Positive for back pain, joint pain and myalgias.  Skin: Negative.  Negative for itching and rash.  Neurological:  Negative for dizziness, tingling, focal weakness and headaches.  Psychiatric/Behavioral:  Negative for depression. The patient is not nervous/anxious and does not have insomnia.    PAST MEDICAL HISTORY :  Past Medical  History:  Diagnosis Date   Agent orange exposure    Anxiety    Arthritis    Atrial fibrillation (HCC) 2013   a.) Dx'd in 2013; recurrence 02/2014; b.) CHA2DS2-VASc = 7 (age x2, HFimpEF, HTN, CVA x 2, previous MI); c.) cardiac rate/rhythm maintained on oral amiodarone ; chronically anticoagulated using apixaban    BPH (benign prostatic hyperplasia)    C. 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)    a.) MV 08/23/2015: EF 50%; b.) TTE 08/23/2015: EF 55-60%, G1DD; c.) TTE 11/02/2015: EF 55-60%, mild LA dil; d.) TTE 02/04/2020: EF 55-60%, mild LVH, G1DD, PASP 44.3, mild-mod AoV sclerosis; e.) MV 12/25/2020: EF 45-54%; f.)  R/LHC 02/04/2021: mRA 13, mPA 27, mPCWP 22, LVEDP 23, Ao sat 99, PA sat 70, CO 6.5, CI 3.0; g.) TTE 02/19/2021: EF 55-60%, G1DD, mildly red RVSF, triv MR, mild-mod AoV scler   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 EF 55%, mild antlat HK;  c.) s/p R/LHC 02/04/2021: LM mild diff dzs, LAD 100p, LCX nl, RCA 50p, 95d, 99 side branch, RPAV fills via LCx. SVG->RPDA 100, SVG->OM1 100, LIMA->LAD patent. EF 35-45%.   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     Hyperlipidemia LDL goal <70    Hypertension    Hypokalemia    Ischemic cardiomyopathy    a.) MV 08/23/2015: EF 50%; b.) TTE 08/23/2015: EF 55-60%; c.) TTE 11/02/2015: EF 55-60%; d.) TTE 02/04/2020: EF 55-60%; e.) MV 12/25/2020: EF 45-54%; f.) R/LHC 02/04/2021: mRA 13, mPA 27, mPCWP 22, LVEDP 23, Ao sat 99, PA sat 70, CO 6.5, CI 3.0; g.) TTE 02/19/2021: EF 55-60%   Ischemic cerebrovascular accident (CVA) (HCC) 10/18/2021   Myocardial infarction (HCC) 2010   On amiodarone  therapy    On apixaban  therapy    OSA on CPAP    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   TIA (transient ischemic attack) 11/02/2015   PAST SURGICAL HISTORY :   Past Surgical History:  Procedure Laterality Date   ABDOMINAL HERNIA REPAIR     APPENDECTOMY  06/21/1985   CORONARY ARTERY BYPASS GRAFT N/A 05/09/2009   EP IMPLANTABLE DEVICE N/A 03/03/2016   Procedure: Pacemaker Implant;  Surgeon: Verona Goodwill, MD;  Location: [facility] INVASIVE CV LAB;  Service: Cardiovascular;  Laterality: N/A;   FOREIGN BODY REMOVAL  1968   "shrapnel in my tailbone"   INGUINAL HERNIA REPAIR Right    JOINT REPLACEMENT Right 2018   LAPAROSCOPIC CHOLECYSTECTOMY     LEFT HEART CATH AND CORONARY ANGIOGRAPHY Left 05/08/2009   Procedure: LEFT HEART CATH AND CORONARY ANGIOGRAPHY; Location: [facility]: Surgeon: Antionette Kirks, MD   LEFT HEART CATH AND CORS/GRAFTS ANGIOGRAPHY Left 10/17/2009    Procedure: LEFT HEART CATH AND CORS/GRAFTS ANGIOGRAPHY; Location: [facility]; Surgeon: Antionette Kirks, MD   RIGHT/LEFT HEART CATH AND CORONARY/GRAFT ANGIOGRAPHY N/A 02/04/2021   Procedure: RIGHT/LEFT HEART CATH AND CORONARY/GRAFT ANGIOGRAPHY;  Surgeon: Sammy Crisp, MD;  Location: [facility] INVASIVE CV LAB;  Service: Cardiovascular;  Laterality: N/A;   TONSILLECTOMY AND ADENOIDECTOMY  1956   TOTAL HIP ARTHROPLASTY Right 10/22/2016   Procedure: TOTAL HIP ARTHROPLASTY;  Surgeon: Arlyne Lame, MD;  Location: [facility] ORS;  Service: Orthopedics;  Laterality: Right;   TOTAL HIP ARTHROPLASTY Left 11/04/2017   Procedure: TOTAL HIP ARTHROPLASTY;  Surgeon: Arlyne Lame, MD;  Location: [facility] ORS;  Service: Orthopedics;  Laterality: Left;    FAMILY HISTORY :   Family History  Problem Relation Age of Onset   Heart disease Mother    Heart attack Mother    Coronary artery disease Other        family history    SOCIAL HISTORY:   Social History   Tobacco Use   Smoking status: Former    Current packs/day: 0.00    Average packs/day: 1 pack/day for 40.0 years (40.0 ttl pk-yrs)    Types: Cigarettes    Start date: 07/21/1966    Quit date: 07/21/2006    Years since quitting: 17.3   Smokeless tobacco: Never  Vaping Use   Vaping status: Former  Substance Use Topics   Alcohol use: Not Currently   Drug use: No    ALLERGIES:  is allergic to atorvastatin  and augmentin  [amoxicillin -pot clavulanate].  MEDICATIONS:  Current Outpatient Medications  Medication Sig Dispense Refill   acalabrutinib  maleate (CALQUENCE ) 100 MG tablet Take 1 tablet (100 mg total) by mouth 2 (two) times daily. 60 tablet 2   acetaminophen  (TYLENOL ) 325 MG tablet Take 1-2 tablets (325-650 mg total) by mouth every 4 (four) hours as needed for mild pain.     albuterol  (VENTOLIN  HFA) 108 (90 Base) MCG/ACT inhaler Inhale 2 puffs into the lungs every 6 (six) hours as needed for wheezing or shortness of breath. 1 each 0   amiodarone  (PACERONE )  200 MG tablet TAKE 1/2 TABLET BY MOUTH TWICE DAILY FOR 5 DAYS A WEEK 60 tablet 0   apixaban  (ELIQUIS ) 5 MG TABS tablet Take 1 tablet (5 mg total) by mouth 2 (two) times daily. 90 tablet 3   Apple Cider Vinegar 500 MG TABS Take 1 tablet by mouth daily.     azelastine  (ASTELIN ) 0.1 % nasal spray Place 2 sprays into both nostrils 2 (two) times daily. Use in each nostril as directed 30 mL 1   cetirizine (ZYRTEC) 10 MG tablet Take 10 mg by  mouth daily as needed for allergies.      Cholecalciferol (VITAMIN D3) 250 MCG (10000 UT) capsule Take 10,000 Units by mouth daily.     Coenzyme Q10 (COQ10) 200 MG CAPS Take 200 mg by mouth daily.     cyanocobalamin  (VITAMIN B12) 1000 MCG tablet Take 1,000 mcg by mouth daily.     ezetimibe  (ZETIA ) 10 MG tablet Take 1 tablet (10 mg total) by mouth daily. (Patient taking differently: Take 10 mg by mouth every morning.) 90 tablet 3   ferrous sulfate  325 (65 FE) MG EC tablet Take 325 mg by mouth daily.     fluticasone -salmeterol (WIXELA INHUB) 100-50 MCG/ACT AEPB Inhale 2 puffs into the lungs 2 (two) times daily.     ipratropium-albuterol  (DUONEB) 0.5-2.5 (3) MG/3ML SOLN Take 3 mLs by nebulization every 6 (six) hours as needed (shorntess of breath). 360 mL 0   mirtazapine  (REMERON ) 15 MG tablet Take 15 mg by mouth at bedtime as needed (for panic associated with PTSD).     mometasone -formoterol  (DULERA ) 200-5 MCG/ACT AERO Inhale 2 puffs into the lungs 2 (two) times daily.     Multiple Vitamin (MULTIVITAMIN WITH MINERALS) TABS tablet Take 1 tablet by mouth daily.     pantoprazole  (PROTONIX ) 40 MG tablet Take 1 tablet (40 mg total) by mouth daily. 90 tablet 0   REPATHA  SURECLICK 140 MG/ML SOAJ ADMINISTER 1 ML UNDER THE SKIN EVERY 14 DAYS 2 mL 11   tamsulosin  (FLOMAX ) 0.4 MG CAPS capsule TAKE 1 CAPSULE(0.4 MG) BY MOUTH DAILY 30 capsule 3   Tiotropium Bromide  Monohydrate 2.5 MCG/ACT AERS Inhale 2 puffs into the lungs at bedtime. Spiriva      tiZANidine  (ZANAFLEX ) 4 MG capsule  Take 1 capsule (4 mg total) by mouth 3 (three) times daily. This can make you sleepy. 60 capsule 0   traMADol  (ULTRAM ) 50 MG tablet Take 50 mg by mouth 2 (two) times daily.     No current facility-administered medications for this visit.   Facility-Administered Medications Ordered in Other Visits  Medication Dose Route Frequency Provider Last Rate Last Admin   potassium chloride  10 mEq in 100 mL IVPB  10 mEq Intravenous Once Borders, Carlene Che, NP        PHYSICAL EXAMINATION: ECOG PERFORMANCE STATUS: 1 - Symptomatic but completely ambulatory  BP (!) 159/75 (BP Location: Left Arm, Patient Position: Sitting, Cuff Size: Large)   Pulse 65   Temp (!) 96.9 F (36.1 C) (Tympanic)   Resp 18   Wt 205 lb (93 kg)   SpO2 100%   BMI 30.27 kg/m   Filed Weights   11/16/23 1019  Weight: 205 lb (93 kg)    Physical Exam HENT:     Head: Normocephalic and atraumatic.     Mouth/Throat:     Pharynx: No oropharyngeal exudate.  Eyes:     Pupils: Pupils are equal, round, and reactive to light.  Cardiovascular:     Rate and Rhythm: Normal rate and regular rhythm.  Pulmonary:     Effort: No respiratory distress.     Breath sounds: No wheezing.  Abdominal:     General: Bowel sounds are normal. There is no distension.     Palpations: Abdomen is soft. There is no mass.     Tenderness: There is no abdominal tenderness. There is no guarding or rebound.  Musculoskeletal:        General: No tenderness. Normal range of motion.     Cervical back: Normal range of motion and  neck supple.  Skin:    General: Skin is warm.  Neurological:     Mental Status: He is alert and oriented to person, place, and time.  Psychiatric:        Mood and Affect: Affect normal.     LABORATORY DATA:  I have reviewed the data as listed    Component Value Date/Time   NA 137 11/16/2023 1001   NA 143 03/11/2021 1136   NA 139 10/11/2014 1800   K 3.8 11/16/2023 1001   K 3.3 (L) 10/11/2014 1800   CL 105 11/16/2023 1001    CL 106 10/11/2014 1800   CO2 24 11/16/2023 1001   CO2 27 10/11/2014 1800   GLUCOSE 113 (H) 11/16/2023 1001   GLUCOSE 107 (H) 10/11/2014 1800   BUN 22 11/16/2023 1001   BUN 13 03/11/2021 1136   BUN 15 10/11/2014 1800   CREATININE 0.92 11/16/2023 1001   CREATININE 0.89 10/11/2014 1800   CALCIUM  8.5 (L) 11/16/2023 1001   CALCIUM  8.8 (L) 10/11/2014 1800   PROT 6.9 11/16/2023 1001   PROT 6.7 05/18/2017 1048   PROT 6.4 (L) 10/11/2014 1800   ALBUMIN  4.1 11/16/2023 1001   ALBUMIN  4.3 05/18/2017 1048   ALBUMIN  4.1 10/11/2014 1800   AST 17 11/16/2023 1001   ALT 15 11/16/2023 1001   ALT 22 10/11/2014 1800   ALKPHOS 72 11/16/2023 1001   ALKPHOS 61 10/11/2014 1800   BILITOT 0.9 11/16/2023 1001   GFRNONAA >60 11/16/2023 1001   GFRNONAA >60 10/11/2014 1800   GFRAA >60 04/12/2020 0959   GFRAA >60 10/11/2014 1800    No results found for: "SPEP", "UPEP"  Lab Results  Component Value Date   WBC 9.8 11/16/2023   NEUTROABS 4.5 11/16/2023   HGB 11.6 (L) 11/16/2023   HCT 35.6 (L) 11/16/2023   MCV 103.2 (H) 11/16/2023   PLT 135 (L) 11/16/2023      Chemistry      Component Value Date/Time   NA 137 11/16/2023 1001   NA 143 03/11/2021 1136   NA 139 10/11/2014 1800   K 3.8 11/16/2023 1001   K 3.3 (L) 10/11/2014 1800   CL 105 11/16/2023 1001   CL 106 10/11/2014 1800   CO2 24 11/16/2023 1001   CO2 27 10/11/2014 1800   BUN 22 11/16/2023 1001   BUN 13 03/11/2021 1136   BUN 15 10/11/2014 1800   CREATININE 0.92 11/16/2023 1001   CREATININE 0.89 10/11/2014 1800      Component Value Date/Time   CALCIUM  8.5 (L) 11/16/2023 1001   CALCIUM  8.8 (L) 10/11/2014 1800   ALKPHOS 72 11/16/2023 1001   ALKPHOS 61 10/11/2014 1800   AST 17 11/16/2023 1001   ALT 15 11/16/2023 1001   ALT 22 10/11/2014 1800   BILITOT 0.9 11/16/2023 1001       RADIOGRAPHIC STUDIES: I have personally reviewed the radiological images as listed and agreed with the findings in the report. No results found.    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  Patient currently on Acalabrutnib [AUG 16th, 2023]-  Patient admits to compliance; 09/04/2023- CT CAP:  Decreased size of lymph nodes above and below the diaphragm; No splenomegaly. Awaiting Neck CT today-Continue Acalbrutinib 100 mg BID.   # Clinically stable; no evidence of any progression -continue monitoring every 2 month. Will repeat scan in 4 months- will order at next visit.   # Neck pain/Joint pains/ Muscle pain- s/p evluation with  Dr.Chasnis/ Dr.Brandon Felipe Horton; [facility]. S/p  surgery [AUG 2024]- stable .   # Easy bruising [posterior Legs]sec Eliquis  + Acalabrutinib + se- stable   # Mild to moderate anemia:[>>> Colo] Continue gentle iron/slow iron.Hb- 12.3- improving-     # Low immunoglobulins IgG 330/Hx  severe respiratory infection/CLL-s/pI IVIG #4/4 [last April 2022].MARCH 2024-- IgG > 1500- stable; will repeat Quant IgGs- ordered. .  # COPD [Dr.Kasa]-chronic cough sputum.  No clinical evidence of any acute exacerbation at this time- stable   # A.fib-on amiodarone  [ Dr.Gollan] sinus rhythm-on Eliquis /CHF- lasix  40/d- s/p Cath [AUG, 2022- Echo-55%] stable   # Left sided CVA- [s/p rehab; April 2023]- stable  # Vaccination: s/p Flu shot [2024]; recommend COVID/RSV  DISPOSITION: # follow up in 2 month-  MD; labs- cbc/cmp; LDH; B12- Dr.B      Orders Placed This Encounter  Procedures   CBC with Differential (Cancer Center Only)    Standing Status:   Future    Expected Date:   01/16/2024    Expiration Date:   11/15/2024   CMP (Cancer Center only)    Standing Status:   Future    Expected Date:   01/16/2024    Expiration Date:   11/15/2024   Lactate dehydrogenase    Standing Status:   Future    Expected Date:   01/16/2024    Expiration Date:   11/15/2024   Vitamin B12    Standing Status:   Future    Expected Date:   01/16/2024    Expiration Date:    11/15/2024   All questions were answered. The patient knows to call the clinic with any problems, questions or concerns.     Gwyn Leos, MD 11/16/2023 11:24 AM

## 2023-11-16 NOTE — Assessment & Plan Note (Signed)
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  Patient currently on Acalabrutnib [AUG 16th, 2023]-  Patient admits to compliance; 09/04/2023- CT CAP:  Decreased size of lymph nodes above and below the diaphragm; No splenomegaly. Awaiting Neck CT today-Continue Acalbrutinib 100 mg BID.   # Clinically stable; no evidence of any progression -continue monitoring every 2 month. Will repeat scan in 4 months- will order at next visit.   # Neck pain/Joint pains/ Muscle pain- s/p evluation with  Dr.Chasnis/ Dr.Brandon Felipe Horton; [facility]. S/p  surgery [AUG 2024]- stable .   # Easy bruising [posterior Legs]sec Eliquis  + Acalabrutinib + se- stable   # Mild to moderate anemia:[>>> Colo] Continue gentle iron/slow iron.Hb- 12.3- improving-     # Low immunoglobulins IgG 330/Hx  severe respiratory infection/CLL-s/pI IVIG #4/4 [last April 2022].MARCH 2024-- IgG > 1500- stable; will repeat Quant IgGs- ordered. .  # COPD [Dr.Kasa]-chronic cough sputum.  No clinical evidence of any acute exacerbation at this time- stable   # A.fib-on amiodarone  [ Dr.Gollan] sinus rhythm-on Eliquis /CHF- lasix  40/d- s/p Cath [AUG, 2022- Echo-55%] stable   # Left sided CVA- [s/p rehab; April 2023]- stable  # Vaccination: s/p Flu shot [2024]; recommend COVID/RSV  DISPOSITION: # follow up in 2 month-  MD; labs- cbc/cmp; LDH; B12- Dr.B

## 2023-11-19 ENCOUNTER — Ambulatory Visit: Attending: Cardiology

## 2023-11-19 DIAGNOSIS — R0609 Other forms of dyspnea: Secondary | ICD-10-CM | POA: Diagnosis not present

## 2023-11-19 DIAGNOSIS — I480 Paroxysmal atrial fibrillation: Secondary | ICD-10-CM | POA: Diagnosis not present

## 2023-11-19 LAB — ECHOCARDIOGRAM COMPLETE
AR max vel: 3.09 cm2
AV Area VTI: 3.07 cm2
AV Area mean vel: 3.05 cm2
AV Mean grad: 4 mmHg
AV Peak grad: 7.5 mmHg
Ao pk vel: 1.37 m/s
Area-P 1/2: 2.76 cm2
Calc EF: 51.4 %
S' Lateral: 2.9 cm
Single Plane A2C EF: 54 %
Single Plane A4C EF: 50.3 %

## 2023-11-19 LAB — IMMUNOGLOBULINS A/E/G/M, SERUM
IgA: 19 mg/dL — ABNORMAL LOW (ref 61–437)
IgE (Immunoglobulin E), Serum: 270 [IU]/mL (ref 6–495)
IgG (Immunoglobin G), Serum: 1056 mg/dL (ref 603–1613)
IgM (Immunoglobulin M), Srm: 29 mg/dL (ref 15–143)

## 2023-11-20 NOTE — Progress Notes (Signed)
 Heart squeeze is noted to be at 50-55% which is low normal, there are some wall motion abnormalities noted likely from prior CABG, there is some mild thickening in the muscle and stiffness in the muscle likely related to age and high blood pressure, there are no valvular abnormalities that were noted.

## 2023-12-01 ENCOUNTER — Ambulatory Visit: Admitting: Cardiovascular Disease

## 2023-12-14 ENCOUNTER — Other Ambulatory Visit: Payer: Self-pay | Admitting: Internal Medicine

## 2023-12-15 ENCOUNTER — Other Ambulatory Visit: Payer: Self-pay | Admitting: Internal Medicine

## 2023-12-15 ENCOUNTER — Other Ambulatory Visit: Payer: Self-pay

## 2023-12-15 DIAGNOSIS — C9110 Chronic lymphocytic leukemia of B-cell type not having achieved remission: Secondary | ICD-10-CM

## 2023-12-15 MED ORDER — CALQUENCE 100 MG PO TABS
100.0000 mg | ORAL_TABLET | Freq: Two times a day (BID) | ORAL | 2 refills | Status: DC
  Filled 2023-12-15: qty 60, 30d supply, fill #0
  Filled 2024-01-18: qty 60, 30d supply, fill #1

## 2023-12-15 NOTE — Progress Notes (Signed)
 Specialty Pharmacy Refill Coordination Note  [name] is a 78 y.o. male contacted today regarding refills of specialty medication(s) No data recorded  Patient requested (Patient-Rptd) Delivery   Delivery date: (Patient-Rptd) 11/24/23   Verified address: (Patient-Rptd) [id number] [place] [id number]   Medication will be filled on 06.02.25.   This fill date is pending response to refill request from provider. Patient is aware and if they have not received fill by intended date they must follow up with pharmacy.

## 2023-12-17 ENCOUNTER — Other Ambulatory Visit: Payer: Self-pay | Admitting: Internal Medicine

## 2023-12-18 ENCOUNTER — Telehealth: Payer: Self-pay | Admitting: Cardiovascular Disease

## 2023-12-18 MED ORDER — AMIODARONE HCL 200 MG PO TABS: ORAL_TABLET | ORAL | 3 refills | Status: AC

## 2023-12-18 NOTE — Telephone Encounter (Signed)
 Pt's medication was sent to pt's pharmacy as requested. Confirmation received.

## 2023-12-18 NOTE — Progress Notes (Signed)
 Remote pacemaker transmission.

## 2023-12-18 NOTE — Addendum Note (Signed)
 Addended by: Lott Rouleau A on: 12/18/2023 03:11 PM   Modules accepted: Orders

## 2023-12-18 NOTE — Telephone Encounter (Signed)
*  STAT* If patient is at the pharmacy, call can be transferred to refill team.   1. Which medications need to be refilled? (please list name of each medication and dose if known)   amiodarone  (PACERONE ) 200 MG tablet   2. Would you like to learn more about the convenience, safety, & potential cost savings by using the [facility]?   3. Are you open to using the [facility] (Type [facility]. ).   4. Which pharmacy/location (including street and city if local pharmacy) is medication to be sent to?  [id number] - [place], [place] - [address] AT [facility] OF [address]   5. Do they need a 30 day or 90 day supply?   90 day  Patient stated he only has enough medication until Sunday (6/1).  Patient has appointment scheduled on 7/21 with Dr. Gollan.

## 2024-01-06 ENCOUNTER — Encounter: Payer: Self-pay | Admitting: Neurosurgery

## 2024-01-06 ENCOUNTER — Ambulatory Visit
Admission: EM | Admit: 2024-01-06 | Discharge: 2024-01-06 | Disposition: A | Discharge status: Home / Self Care | Attending: Family Medicine | Admitting: Family Medicine

## 2024-01-06 ENCOUNTER — Encounter: Payer: Self-pay | Admitting: Internal Medicine

## 2024-01-06 ENCOUNTER — Ambulatory Visit: Payer: Self-pay

## 2024-01-06 ENCOUNTER — Encounter: Payer: Self-pay | Admitting: Emergency Medicine

## 2024-01-06 ENCOUNTER — Ambulatory Visit: Payer: Non-veteran care | Admitting: Orthopedic Surgery

## 2024-01-06 VITALS — BP 142/72 | Temp 98.2°F | Ht 69.0 in | Wt 205.0 lb

## 2024-01-06 DIAGNOSIS — L02419 Cutaneous abscess of limb, unspecified: Secondary | ICD-10-CM | POA: Diagnosis not present

## 2024-01-06 DIAGNOSIS — Z9889 Other specified postprocedural states: Secondary | ICD-10-CM

## 2024-01-06 DIAGNOSIS — L03119 Cellulitis of unspecified part of limb: Secondary | ICD-10-CM | POA: Diagnosis not present

## 2024-01-06 DIAGNOSIS — S81801A Unspecified open wound, right lower leg, initial encounter: Secondary | ICD-10-CM

## 2024-01-06 DIAGNOSIS — L0291 Cutaneous abscess, unspecified: Secondary | ICD-10-CM

## 2024-01-06 MED ORDER — DOXYCYCLINE HYCLATE 100 MG PO CAPS: 100.0000 mg | ORAL_CAPSULE | Freq: Two times a day (BID) | ORAL | 0 refills | Status: DC

## 2024-01-06 NOTE — ED Provider Notes (Incomplete)
 [facility]    CSN: [id number] Arrival date & time: 01/06/24  1320      History   Chief Complaint No chief complaint on file.   HPI [name] is a 78 y.o. male.   HPI  History provided by wife and patient  [name] presents for 3 days of worsening right lower leg wound.    There is been no new products including soaps and detergents.  No eye irritation, sore throat, difficulty breathing, nausea, vomiting or diarrhea.  Denies belly pain, joint pain and fever.  There has been no medication changes or new supplements.  Denies any new foods or drinks.    ***Redness, pain, swelling ***Treatments tried ***Previous symptoms ***Insect or bug bites  Fever : no Vision changes: No Sore throat: no   Shortness of breath: no Rhinorrhea: no Appetite: normal  Hydration: normal  Abdominal pain: no Nausea: no Vomiting: no Diarrhea: no Dysuria: no  Sleep disturbance: no Arthralgias: no Headache: no   Past Medical History:  Diagnosis Date   Agent orange exposure    Anxiety    Arthritis    Atrial fibrillation (HCC) 2013   a.) Dx'd in 2013; recurrence 02/2014; b.) CHA2DS2-VASc = 7 (age x2, HFimpEF, HTN, CVA x 2, previous MI); c.) cardiac rate/rhythm maintained on oral amiodarone ; chronically anticoagulated using apixaban    BPH (benign prostatic hyperplasia)    C. 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)    a.) MV 08/23/2015: EF 50%; b.) TTE 08/23/2015: EF 55-60%, G1DD; c.) TTE 11/02/2015: EF 55-60%, mild LA dil; d.) TTE 02/04/2020: EF 55-60%, mild LVH, G1DD, PASP 44.3, mild-mod AoV sclerosis; e.) MV 12/25/2020: EF 45-54%; f.)  R/LHC 02/04/2021: mRA 13, mPA 27, mPCWP 22, LVEDP 23, Ao sat 99, PA sat 70, CO 6.5, CI 3.0; g.) TTE 02/19/2021:  EF 55-60%, G1DD, mildly red RVSF, triv MR, mild-mod AoV scler   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 EF 55%, mild antlat HK;  c.) s/p R/LHC 02/04/2021: LM mild diff dzs, LAD 100p, LCX nl, RCA 50p, 95d, 99 side branch, RPAV fills via LCx. SVG->RPDA 100, SVG->OM1 100, LIMA->LAD patent. EF 35-45%.   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    Hyperlipidemia LDL goal <70    Hypertension    Hypokalemia    Ischemic cardiomyopathy    a.) MV 08/23/2015: EF 50%; b.) TTE 08/23/2015: EF 55-60%; c.) TTE 11/02/2015: EF 55-60%; d.) TTE 02/04/2020: EF 55-60%; e.) MV 12/25/2020: EF 45-54%; f.) R/LHC 02/04/2021: mRA 13, mPA 27, mPCWP 22, LVEDP 23, Ao sat 99, PA sat 70, CO 6.5, CI 3.0; g.) TTE 02/19/2021: EF 55-60%  Ischemic cerebrovascular accident (CVA) (HCC) 10/18/2021   Myocardial infarction (HCC) 2010   On amiodarone  therapy    On apixaban  therapy    OSA on CPAP    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   TIA (transient ischemic attack) 11/02/2015    Patient Active Problem List   Diagnosis Date Noted   Pneumonia due to infectious organism 10/13/2023   Head injury 07/10/2023   Spondylosis, cervical, with myelopathy 04/21/2023   Cervical myelopathy (HCC) 04/21/2023   Campylobacter enteritis 04/02/2023   Class 1 obesity due to excess calories with body mass index (BMI) of 30.0 to 30.9 in adult 04/01/2023   Hypotension 03/31/2023   Campylobacter gastroenteritis 03/31/2023   Respiratory tract infection due to COVID-19 virus 03/10/2023   Spinal stenosis 01/09/2023   Dizziness 01/09/2023   BPH (benign prostatic hyperplasia) 01/09/2023   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   Sensorineural hearing loss, 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   (HFpEF) heart failure with preserved ejection fraction (HCC) 06/30/2022   Abnormal INR 06/30/2022   Anxiety 06/30/2022   GERD (gastroesophageal reflux disease) 06/30/2022   History of CVA (cerebrovascular accident) 06/30/2022   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/30/2022   Anemia 06/02/2022   Dysphagia 01/03/2022   Lymphadenopathy 01/03/2022   Chronic neck pain 01/03/2022   Hearing loss 12/23/2021   Personal history of tobacco use, presenting hazards to health 12/23/2021   Polyp of colon 12/23/2021   Neck pain 12/23/2021   Neck nodule 11/11/2021   CVA (cerebral vascular accident) (HCC) 10/18/2021   AKI (acute kidney injury) (HCC) 09/13/2021   Weakness 09/04/2021   COVID-19 virus infection 09/04/2021   Accelerating angina (HCC) 02/04/2021   Abnormal stress test    Cardiomyopathy (HCC)    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   CAD (coronary artery disease)    Depression    Arthritis 09/16/2019   Dehydration 03/14/2019   Sick  sinus syndrome (HCC) 07/08/2016   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   Thrombocytopenia (HCC) 02/29/2016   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    PAD (peripheral artery disease) (HCC)    OSA on CPAP    Chronic diastolic CHF (congestive heart failure) (HCC) 09/20/2015   CLL (chronic lymphocytic leukemia) (HCC)    PTSD (post-traumatic stress disorder)    Osteoarthritis of both knees 07/05/2015   COPD (chronic obstructive pulmonary disease) (HCC) 01/01/2012   Shortness of breath 10/09/2011   Paroxysmal atrial fibrillation (HCC) 10/09/2011   Hyperlipidemia 11/28/2009   Essential hypertension 11/28/2009    Past Surgical History:  Procedure Laterality Date   ABDOMINAL HERNIA REPAIR     APPENDECTOMY  06/21/1985   CORONARY ARTERY BYPASS GRAFT N/A 05/09/2009   EP IMPLANTABLE DEVICE N/A 03/03/2016   Procedure: Pacemaker Implant;  Surgeon: Verona Goodwill, MD;  Location: [facility] INVASIVE CV LAB;  Service: Cardiovascular;  Laterality: N/A;   FOREIGN BODY REMOVAL  1968   shrapnel in my tailbone   INGUINAL HERNIA REPAIR Right    JOINT REPLACEMENT Right 2018   LAPAROSCOPIC CHOLECYSTECTOMY     LEFT HEART CATH AND CORONARY ANGIOGRAPHY Left 05/08/2009   Procedure: LEFT HEART CATH AND CORONARY ANGIOGRAPHY; Location: [facility]: Surgeon: Antionette Kirks, MD   LEFT HEART CATH AND CORS/GRAFTS ANGIOGRAPHY Left 10/17/2009   Procedure: LEFT HEART CATH AND CORS/GRAFTS ANGIOGRAPHY; Location: [facility]; Surgeon: Antionette Kirks, MD   RIGHT/LEFT HEART CATH AND CORONARY/GRAFT ANGIOGRAPHY N/A 02/04/2021   Procedure: RIGHT/LEFT HEART CATH AND CORONARY/GRAFT ANGIOGRAPHY;  Surgeon: Sammy Crisp, MD;  Location: [facility] INVASIVE CV LAB;  Service: Cardiovascular;  Laterality: N/A;   TONSILLECTOMY AND ADENOIDECTOMY  1956   TOTAL HIP ARTHROPLASTY Right 10/22/2016   Procedure: TOTAL HIP ARTHROPLASTY;  Surgeon: Arlyne Lame, MD;  Location: [facility] ORS;  Service: Orthopedics;  Laterality: Right;    TOTAL HIP ARTHROPLASTY Left 11/04/2017   Procedure: TOTAL HIP ARTHROPLASTY;  Surgeon: Arlyne Lame, MD;  Location: [facility] ORS;  Service: Orthopedics;  Laterality: Left;       Home Medications    Prior to Admission medications   Medication Sig Start Date End Date Taking? Authorizing Provider  acalabrutinib  maleate (CALQUENCE ) 100 MG tablet Take 1 tablet (100 mg total) by mouth 2 (two) times daily. 12/15/23   Brahmanday, Govinda R, MD  acetaminophen  (TYLENOL ) 325 MG tablet Take 1-2 tablets (325-650 mg total) by mouth every 4 (four) hours as needed for mild pain. 11/01/21   Setzer, Sandra J, PA-C  albuterol  (VENTOLIN  HFA) 108 (90 Base) MCG/ACT inhaler Inhale 2 puffs into the lungs every 6 (six) hours as needed for wheezing or shortness of breath. 11/01/21   Setzer, Sandra J, PA-C  amiodarone  (PACERONE ) 200 MG tablet TAKE 1/2 TABLET BY MOUTH TWICE DAILY FOR 5 DAYS A WEEK 12/18/23   Verona Goodwill, MD  apixaban  (ELIQUIS ) 5 MG TABS tablet Take 1 tablet (5 mg total) by mouth 2 (two) times daily. 10/28/23   Ronald Cockayne, NP  Apple Cider Vinegar 500 MG TABS Take 1 tablet by mouth daily.    [provider]  azelastine  (ASTELIN ) 0.1 % nasal spray Place 2 sprays into both nostrils 2 (two) times daily. Use in each nostril as directed 03/10/23   Bluford Burkitt, NP  cetirizine (ZYRTEC) 10 MG tablet Take 10 mg by mouth daily as needed for allergies.     [provider]  Cholecalciferol (VITAMIN D3) 250 MCG (10000 UT) capsule Take 10,000 Units by mouth daily.    [provider]  Coenzyme Q10 (COQ10) 200 MG CAPS Take 200 mg by mouth daily.    [provider]  cyanocobalamin  (VITAMIN B12) 1000 MCG tablet Take 1,000 mcg by mouth daily.    [provider]  ezetimibe  (ZETIA ) 10 MG tablet Take 1 tablet (10 mg total) by mouth daily. Patient taking differently: Take 10 mg by mouth every morning. 01/06/17 11/01/27  Gollan, Timothy J, MD  ferrous sulfate  325 (65 FE) MG EC tablet  Take 325 mg by mouth daily.    [provider]  fluticasone -salmeterol (WIXELA INHUB) 100-50 MCG/ACT AEPB Inhale 2 puffs into the lungs 2 (two) times daily.    [provider]  ipratropium-albuterol  (DUONEB) 0.5-2.5 (3) MG/3ML SOLN Take 3 mLs by nebulization every 6 (six) hours as needed (shorntess of breath). 09/24/23   Floydene Hy, PA-C  mirtazapine  (REMERON ) 15 MG tablet Take 15 mg by  mouth at bedtime as needed (for panic associated with PTSD).    [provider]  mometasone -formoterol  (DULERA ) 200-5 MCG/ACT AERO Inhale 2 puffs into the lungs 2 (two) times daily.    [provider]  Multiple Vitamin (MULTIVITAMIN WITH MINERALS) TABS tablet Take 1 tablet by mouth daily.    [provider]  pantoprazole  (PROTONIX ) 40 MG tablet Take 1 tablet (40 mg total) by mouth daily. 10/21/23   Thersia Flax, MD  REPATHA  SURECLICK 140 MG/ML SOAJ ADMINISTER 1 ML UNDER THE SKIN EVERY 14 DAYS 10/19/23   Gollan, Timothy J, MD  tamsulosin  (FLOMAX ) 0.4 MG CAPS capsule TAKE 1 CAPSULE(0.4 MG) BY MOUTH DAILY 05/12/23   Kent Pear, MD  Tiotropium Bromide  Monohydrate 2.5 MCG/ACT AERS Inhale 2 puffs into the lungs at bedtime. Spiriva  08/07/20   [provider]  tiZANidine  (ZANAFLEX ) 4 MG capsule Take 1 capsule (4 mg total) by mouth 3 (three) times daily. This can make you sleepy. 04/24/23   Lucetta Russel, PA-C  traMADol  (ULTRAM ) 50 MG tablet Take 50 mg by mouth 2 (two) times daily.    [provider]    Family History Family History  Problem Relation Age of Onset   Heart disease Mother    Heart attack Mother    Coronary artery disease Other        family history    Social History Social History   Tobacco Use   Smoking status: Former    Current packs/day: 0.00    Average packs/day: 1 pack/day for 40.0 years (40.0 ttl pk-yrs)    Types: Cigarettes    Start date: 07/21/1966    Quit date: 07/21/2006    Years since quitting: 17.4   Smokeless tobacco:  Never  Vaping Use   Vaping status: Former  Substance Use Topics   Alcohol use: Not Currently   Drug use: No     Allergies   Atorvastatin  and Augmentin  [amoxicillin -pot clavulanate]   Review of Systems Review of Systems :negative unless otherwise stated in HPI.      Physical Exam Triage Vital Signs ED Triage Vitals  Encounter Vitals Group     BP      Girls Systolic BP Percentile      Girls Diastolic BP Percentile      Boys Systolic BP Percentile      Boys Diastolic BP Percentile      Pulse      Resp      Temp      Temp src      SpO2      Weight      Height      Head Circumference      Peak Flow      Pain Score      Pain Loc      Pain Education      Exclude from Growth Chart    No data found.  Updated Vital Signs There were no vitals taken for this visit.  Visual Acuity Right Eye Distance:   Left Eye Distance:   Bilateral Distance:    Right Eye Near:   Left Eye Near:    Bilateral Near:     Physical Exam  GEN: alert, well appearing male, in no acute distress *** EYES: no scleral injection or discharge*** CV: regular rate and rhythm *** RESP: no increased work of breathing, clear to ascultation bilaterally*** MSK: no extremity edema *** NEURO: alert, moves all extremities appropriately SKIN: warm and dry; ***   ***  pic  UC Treatments / Results  Labs (all labs ordered are listed, but only abnormal results are displayed) Labs Reviewed - No data to display  EKG   Radiology No results found.  Procedures Procedures (including critical care time)  Medications Ordered in UC Medications - No data to display  Initial Impression / Assessment and Plan / UC Course  I have reviewed the triage vital signs and the nursing notes.  Pertinent labs & imaging results that were available during my care of the patient were reviewed by me and considered in my medical decision making (see chart for details).     Patient is a 78 y.o. malewho presents  for***.  Overall, patient is well-appearing and well-hydrated.  Vital signs stable.  [name] is afebrile.  Exam concerning for ***.  Treat with***steroid ointment. ***No sign of infection to suggest antibiotics or antifungals at this time.  ***Not likely viral exanthem.    Reviewed expectations regarding course of current medical issues.  All questions asked were answered.  Outlined signs and symptoms indicating need for more acute intervention. Patient verbalized understanding. After Visit Summary given.   Final Clinical Impressions(s) / UC Diagnoses   Final diagnoses:  None   Discharge Instructions   None    ED Prescriptions   None    PDMP not reviewed this encounter.

## 2024-01-06 NOTE — Progress Notes (Signed)
   REFERRING PHYSICIAN:  Kent Pear, Md [address] [place],  [place] [address]  DOS: 04/21/23 C3-T1 laminoplasty   HISTORY OF PRESENT ILLNESS:  He did well with above surgery. He has no increased pain, but noticed his incision felt lumpy. No neck or arm pain. No numbness, tingling, or weakness.   He notes a possible spider bite to his right lower leg that started about a week ago. He has mild pain in this area. He notes worsening redness and swelling.   No fevers or chills. No SOB or itching.   He sees his PCP tomorrow.   PHYSICAL EXAMINATION:  NEUROLOGICAL:  General: In no acute distress.   Awake, alert, oriented to person, place, and time.  Pupils equal round and reactive to light.   Strength: Side Biceps Triceps Deltoid Interossei Grip Wrist Ext. Wrist Flex.  R 5 5 5 5 5 5 5   L 5 5 5 5 5 5 5    Incision shows prominence of spinous processes, it is well healed.    Right lower leg as above. He has mild swelling in right calf into his foot. He has tenderness over bite/wound. No drainage noted.   Imaging:  No interval imaging to review today  Assessment / Plan: [name] is doing well after his cervical surgery. He has some prominence of his spinous processes. No neck or arm pain. No numbness, tingling, or weakness.   He has apparent spider bite to right lower leg that does not look good.   Dr. Felipe Horton was in to see him today as well. Recommend he follow up with PCP or UC regarding spider bite.  - He has NP appt with Bluford Burkitt NP tomorrow. We called to see if he could be seen today for his leg and they did not have any availability.  - Recommend he see urgent care today. He was going to go to UC in [place]. Line was drawn around redness on lower leg.  - He can follow up with with us  prn for his cervical spine.   I spent a total of 25 minutes in face-to-face and non-face-to-face activities related to this patient's care today including review of  outside records, review of imaging, review of symptoms, physical exam, discussion of differential diagnosis, discussion of treatment options, and documentation.   Lucetta Russel PA-C Dept of Neurosurgery

## 2024-01-06 NOTE — ED Triage Notes (Signed)
 Pt presents with a ?insect bite to his right calf x 3 days. Pt states the are is getting worse.

## 2024-01-06 NOTE — Discharge Instructions (Addendum)
 Apply some warm compresses to the skin 3-4 times a day to see if the abscess becomes soft enough to be cut open.   Stop by the pharmacy to pick up your prescriptions.  Follow up with your primary care provider or return to the urgent care, if wound softens and is able to be drained.  If spreading occurs quickly, go to the hospital emergency department as you may need IV antibiotics.

## 2024-01-06 NOTE — Telephone Encounter (Signed)
 FYI Only or Action Required?: FYI only for provider  Patient was last seen in primary care on 10/13/2023 by Bluford Burkitt, NP. Called Nurse Triage reporting Insect Bite. Symptoms began several days ago. Interventions attempted: Nothing. Symptoms are: right calf redness, swelling, pain with possible spider bite rapidly worsening.  Triage Disposition: See HCP Within 4 Hours (Or PCP Triage) (overriding See Physician Within 24 Hours)  Patient/caregiver understands and will follow disposition?: Yes                Copied from CRM [id number]. Topic: Clinical - Red Word Triage >> Jan 06, 2024 11:07 AM Freya Jesus wrote: Red Word that prompted transfer to Nurse Triage: Kendalyn from [facility] called said patient has a spider bite and she is wondering if there is a way we can get patient seen today. Reason for Disposition  [1] Red or very tender (to touch) area AND [2] getting larger over 48 hours after the bite  Answer Assessment - Initial Assessment Questions 1. TYPE of INSECT: What type of insect was it?      Assuming it is a spider bite based on the way it looks.  2. ONSET: When did you get bitten?      A few days.  3. LOCATION: Where is the insect bite located?      Right lower leg.  4. REDNESS: Is the area red or pink? If Yes, ask: What size is area of redness? (inches or cm). When did the redness start?     Yes. Started out as a dime size a few days ago and has gradually worsened. Spreading redness from site.  5. PAIN: Is there any pain? If Yes, ask: How bad is it?  (Scale 1-10; or mild, moderate, severe)     Yes.  6. ITCHING: Does it itch? If Yes, ask: How bad is the itch?    - MILD: doesn't interfere with normal activities   - MODERATE-SEVERE: interferes with work, school, sleep, or other activities      No.  7. SWELLING: How big is the swelling? (inches, cm, or compare to coins)     Yes, mild swelling to right calf per neurosurgery  note.  8. OTHER SYMPTOMS: Do you have any other symptoms?  (e.g., difficulty breathing, hives)     No fever, itching or difficulty breathing.  9. PREGNANCY: Is there any chance you are pregnant? When was your last menstrual period?     N/A.  [facility] clinic calling on behalf of patient requesting if patient can be seen sooner than his appointment tomorrow due to right calf possible spider bite, unknown injury or bite. Patient reports to staff he has had it for a few days and it has worsened from the size of a dime to now a few inches diameter.  Protocols used: Insect Bite-A-AH, Spider Bite - [employer]

## 2024-01-07 ENCOUNTER — Encounter: Payer: Self-pay | Admitting: Nurse Practitioner

## 2024-01-07 ENCOUNTER — Ambulatory Visit (INDEPENDENT_AMBULATORY_CARE_PROVIDER_SITE_OTHER): Payer: Non-veteran care | Admitting: Nurse Practitioner

## 2024-01-07 VITALS — BP 130/68 | HR 69 | Temp 97.9°F | Resp 20 | Ht 69.0 in | Wt 205.2 lb

## 2024-01-07 DIAGNOSIS — L03115 Cellulitis of right lower limb: Secondary | ICD-10-CM

## 2024-01-07 DIAGNOSIS — N401 Enlarged prostate with lower urinary tract symptoms: Secondary | ICD-10-CM

## 2024-01-07 DIAGNOSIS — J432 Centrilobular emphysema: Secondary | ICD-10-CM

## 2024-01-07 DIAGNOSIS — R2689 Other abnormalities of gait and mobility: Secondary | ICD-10-CM | POA: Insufficient documentation

## 2024-01-07 DIAGNOSIS — E785 Hyperlipidemia, unspecified: Secondary | ICD-10-CM

## 2024-01-07 DIAGNOSIS — I480 Paroxysmal atrial fibrillation: Secondary | ICD-10-CM

## 2024-01-07 DIAGNOSIS — I10 Essential (primary) hypertension: Secondary | ICD-10-CM

## 2024-01-07 DIAGNOSIS — C9110 Chronic lymphocytic leukemia of B-cell type not having achieved remission: Secondary | ICD-10-CM

## 2024-01-07 DIAGNOSIS — R350 Frequency of micturition: Secondary | ICD-10-CM | POA: Diagnosis not present

## 2024-01-07 DIAGNOSIS — Z659 Problem related to unspecified psychosocial circumstances: Secondary | ICD-10-CM | POA: Insufficient documentation

## 2024-01-07 LAB — COMPREHENSIVE METABOLIC PANEL WITH GFR
ALT: 12 U/L (ref 0–53)
AST: 14 U/L (ref 0–37)
Albumin: 3.9 g/dL (ref 3.5–5.2)
Alkaline Phosphatase: 90 U/L (ref 39–117)
BUN: 16 mg/dL (ref 6–23)
CO2: 30 meq/L (ref 19–32)
Calcium: 8.9 mg/dL (ref 8.4–10.5)
Chloride: 107 meq/L (ref 96–112)
Creatinine, Ser: 0.86 mg/dL (ref 0.40–1.50)
GFR: 82.91 mL/min (ref >60.00)
Glucose, Bld: 109 mg/dL — ABNORMAL HIGH (ref 70–99)
Potassium: 4.4 meq/L (ref 3.5–5.1)
Sodium: 141 meq/L (ref 135–145)
Total Bilirubin: 0.6 mg/dL (ref 0.2–1.2)
Total Protein: 6 g/dL (ref 6.0–8.3)

## 2024-01-07 LAB — LIPID PANEL
Cholesterol: 94 mg/dL (ref 0–200)
HDL: 47.1 mg/dL (ref >39.00)
LDL Cholesterol: 25 mg/dL (ref 0–99)
NonHDL: 46.8
Total CHOL/HDL Ratio: 2
Triglycerides: 107 mg/dL (ref 0.0–149.0)
VLDL: 21.4 mg/dL (ref 0.0–40.0)

## 2024-01-07 LAB — PSA: PSA: 2.24 ng/mL (ref 0.10–4.00)

## 2024-01-07 NOTE — Progress Notes (Unsigned)
 Bluford Burkitt, NP-C Phone: [phone]  [name] is a 78 y.o. male who presents today for transfer of care.   ***  Social History   Tobacco Use  Smoking Status Former   Current packs/day: 0.00   Average packs/day: 1 pack/day for 40.0 years (40.0 ttl pk-yrs)   Types: Cigarettes   Start date: 07/21/1966   Quit date: 07/21/2006   Years since quitting: 17.4  Smokeless Tobacco Never    Current Outpatient Medications on File Prior to Visit  Medication Sig Dispense Refill   acalabrutinib  maleate (CALQUENCE ) 100 MG tablet Take 1 tablet (100 mg total) by mouth 2 (two) times daily. 60 tablet 2   acetaminophen  (TYLENOL ) 325 MG tablet Take 1-2 tablets (325-650 mg total) by mouth every 4 (four) hours as needed for mild pain.     albuterol  (VENTOLIN  HFA) 108 (90 Base) MCG/ACT inhaler Inhale 2 puffs into the lungs every 6 (six) hours as needed for wheezing or shortness of breath. 1 each 0   amiodarone  (PACERONE ) 200 MG tablet TAKE 1/2 TABLET BY MOUTH TWICE DAILY FOR 5 DAYS A WEEK 75 tablet 3   apixaban  (ELIQUIS ) 5 MG TABS tablet Take 1 tablet (5 mg total) by mouth 2 (two) times daily. 90 tablet 3   Apple Cider Vinegar 500 MG TABS Take 1 tablet by mouth daily.     azelastine  (ASTELIN ) 0.1 % nasal spray Place 2 sprays into both nostrils 2 (two) times daily. Use in each nostril as directed 30 mL 1   cetirizine (ZYRTEC) 10 MG tablet Take 10 mg by mouth daily as needed for allergies.      Cholecalciferol (VITAMIN D3) 250 MCG (10000 UT) capsule Take 10,000 Units by mouth daily.     Coenzyme Q10 (COQ10) 200 MG CAPS Take 200 mg by mouth daily.     cyanocobalamin  (VITAMIN B12) 1000 MCG tablet Take 1,000 mcg by mouth daily.     doxycycline  (VIBRAMYCIN ) 100 MG capsule Take 1 capsule (100 mg total) by mouth 2 (two) times daily. 20 capsule 0   ezetimibe  (ZETIA ) 10 MG tablet Take 1 tablet (10 mg total) by mouth daily. (Patient taking differently: Take 10 mg by mouth every morning.) 90 tablet 3   ferrous  sulfate 325 (65 FE) MG EC tablet Take 325 mg by mouth daily.     fluticasone -salmeterol (WIXELA INHUB) 100-50 MCG/ACT AEPB Inhale 2 puffs into the lungs 2 (two) times daily.     ipratropium-albuterol  (DUONEB) 0.5-2.5 (3) MG/3ML SOLN Take 3 mLs by nebulization every 6 (six) hours as needed (shorntess of breath). 360 mL 0   mirtazapine  (REMERON ) 15 MG tablet Take 15 mg by mouth at bedtime as needed (for panic associated with PTSD).     mometasone -formoterol  (DULERA ) 200-5 MCG/ACT AERO Inhale 2 puffs into the lungs 2 (two) times daily.     Multiple Vitamin (MULTIVITAMIN WITH MINERALS) TABS tablet Take 1 tablet by mouth daily.     pantoprazole  (PROTONIX ) 40 MG tablet Take 1 tablet (40 mg total) by mouth daily. 90 tablet 0   REPATHA  SURECLICK 140 MG/ML SOAJ ADMINISTER 1 ML UNDER THE SKIN EVERY 14 DAYS 2 mL 11   tamsulosin  (FLOMAX ) 0.4 MG CAPS capsule TAKE 1 CAPSULE(0.4 MG) BY MOUTH DAILY 30 capsule 3   Tiotropium Bromide  Monohydrate 2.5 MCG/ACT AERS Inhale 2 puffs into the lungs at bedtime. Spiriva      tiZANidine  (ZANAFLEX ) 4 MG capsule Take 1 capsule (4 mg total) by mouth 3 (three) times daily. This can  make you sleepy. 60 capsule 0   traMADol  (ULTRAM ) 50 MG tablet Take 50 mg by mouth 2 (two) times daily.     Current Facility-Administered Medications on File Prior to Visit  Medication Dose Route Frequency Provider Last Rate Last Admin   potassium chloride  10 mEq in 100 mL IVPB  10 mEq Intravenous Once Borders, Carlene Che, NP         ROS see history of present illness  Objective  Physical Exam Vitals:   01/07/24 1008  BP: 130/68  Pulse: 69  Resp: 20  Temp: 97.9 F (36.6 C)  SpO2: 99%    BP Readings from Last 3 Encounters:  01/07/24 130/68  01/06/24 121/62  01/06/24 (!) 142/72   Wt Readings from Last 3 Encounters:  01/07/24 205 lb 4 oz (93.1 kg)  01/06/24 205 lb (93 kg)  11/16/23 205 lb (93 kg)    Physical Exam   Assessment/Plan: Please see individual problem  list.  Hyperlipidemia, unspecified hyperlipidemia type  Benign prostatic hyperplasia with urinary frequency  Centrilobular emphysema (HCC)  Paroxysmal atrial fibrillation (HCC)  Essential hypertension     Health Maintenance: ***  No follow-ups on file.   Bluford Burkitt, NP-C [facility]

## 2024-01-08 ENCOUNTER — Ambulatory Visit: Payer: Self-pay | Admitting: Nurse Practitioner

## 2024-01-09 ENCOUNTER — Encounter: Payer: Self-pay | Admitting: Emergency Medicine

## 2024-01-09 ENCOUNTER — Ambulatory Visit
Admission: EM | Admit: 2024-01-09 | Discharge: 2024-01-09 | Disposition: A | Discharge status: Home / Self Care | Attending: Physician Assistant | Admitting: Physician Assistant

## 2024-01-09 DIAGNOSIS — I10 Essential (primary) hypertension: Secondary | ICD-10-CM

## 2024-01-09 DIAGNOSIS — L03115 Cellulitis of right lower limb: Secondary | ICD-10-CM

## 2024-01-09 MED ORDER — CEPHALEXIN 500 MG PO CAPS: 1000.0000 mg | ORAL_CAPSULE | Freq: Two times a day (BID) | ORAL | 0 refills | Status: AC

## 2024-01-09 NOTE — ED Triage Notes (Signed)
 Patient here for wound check to his right lower leg.  Patient reports that the redness has spread over the marked line on his leg.  Patient denies fevers.

## 2024-01-09 NOTE — ED Provider Notes (Signed)
 [facility]    CSN: [id number] Arrival date & time: 01/09/24  1206      History   Chief Complaint Chief Complaint  Patient presents with   Wound Check    Right lower leg    HPI [name] is a 78 y.o. male presenting for reevaluation of an area of redness, swelling and pain of the right lower leg.  He was seen here 3 days ago by another provider and diagnosed with cellulitis.  He was placed on doxycycline .  A line was drawn around the area of redness.  The redness has extended but not beyond the line.  Reports no improvement in condition.  Denies any significant worsening.  No fever.  No drainage from the wound/cellulitic site.  Medical history significant for hypertension, hyperlipidemia, atrial fibrillation, COPD, CLL, obesity, sleep apnea, sick sinus syndrome, CAD, secondary immunodeficiency disorder, CVA, chronic neck pain, heart failure, and arthritis.  Patient is on long-term anticoagulant with Eliquis .  HPI  Past Medical History:  Diagnosis Date   Agent orange exposure    Anxiety    Arthritis    Atrial fibrillation (HCC) 2013   a.) Dx'd in 2013; recurrence 02/2014; b.) CHA2DS2-VASc = 7 (age x2, HFimpEF, HTN, CVA x 2, previous MI); c.) cardiac rate/rhythm maintained on oral amiodarone ; chronically anticoagulated using apixaban    BPH (benign prostatic hyperplasia)    C. 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)    a.) MV 08/23/2015: EF 50%; b.) TTE 08/23/2015: EF 55-60%, G1DD; c.) TTE 11/02/2015: EF 55-60%, mild LA dil; d.) TTE 02/04/2020: EF 55-60%, mild LVH, G1DD, PASP 44.3, mild-mod AoV sclerosis; e.) MV 12/25/2020: EF 45-54%; f.)  R/LHC 02/04/2021: mRA 13, mPA 27, mPCWP 22, LVEDP 23, Ao sat 99, PA sat 70, CO 6.5, CI 3.0; g.) TTE  02/19/2021: EF 55-60%, G1DD, mildly red RVSF, triv MR, mild-mod AoV scler   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 EF 55%, mild antlat HK;  c.) s/p R/LHC 02/04/2021: LM mild diff dzs, LAD 100p, LCX nl, RCA 50p, 95d, 99 side branch, RPAV fills via LCx. SVG->RPDA 100, SVG->OM1 100, LIMA->LAD patent. EF 35-45%.   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    Hyperlipidemia LDL goal <70    Hypertension    Hypokalemia    Ischemic cardiomyopathy    a.) MV 08/23/2015: EF 50%; b.) TTE 08/23/2015: EF 55-60%; c.) TTE 11/02/2015: EF 55-60%; d.) TTE 02/04/2020: EF 55-60%; e.) MV 12/25/2020: EF 45-54%; f.) R/LHC 02/04/2021: mRA 13, mPA 27, mPCWP 22, LVEDP 23, Ao sat 99, PA sat 70, CO 6.5, CI 3.0; g.) TTE 02/19/2021: EF 55-60%  Ischemic cerebrovascular accident (CVA) (HCC) 10/18/2021   Myocardial infarction (HCC) 2010   On amiodarone  therapy    On apixaban  therapy    OSA on CPAP    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   TIA (transient ischemic attack) 11/02/2015    Patient Active Problem List   Diagnosis Date Noted   Other abnormalities of gait and mobility 01/07/2024   Problem related to unspecified psychosocial circumstances 01/07/2024   Pneumonia due to infectious organism 10/13/2023   Head injury 07/10/2023   Spondylosis, cervical, with myelopathy 04/21/2023   Cervical myelopathy (HCC) 04/21/2023   Class 1 obesity due to excess calories with body mass index (BMI) of 30.0 to 30.9 in adult 04/01/2023   Hypotension 03/31/2023   Campylobacter gastroenteritis 03/31/2023   Respiratory tract infection due to COVID-19 virus 03/10/2023   Spinal stenosis 01/09/2023   Dizziness 01/09/2023   BPH (benign prostatic hyperplasia) 01/09/2023   Orthostatic hypertension 12/11/2022   Fatigue 12/08/2022   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   Sensorineural hearing loss, 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   (HFpEF) heart failure with preserved ejection fraction (HCC) 06/30/2022   Abnormal INR 06/30/2022   Anxiety 06/30/2022   GERD (gastroesophageal reflux disease) 06/30/2022   History of CVA (cerebrovascular accident) 06/30/2022   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/30/2022   Anemia 06/02/2022   Dysphagia 01/03/2022   Lymphadenopathy 01/03/2022   Chronic neck pain 01/03/2022   Hearing loss 12/23/2021   Personal history of tobacco use, presenting hazards to health 12/23/2021   Polyp of colon 12/23/2021   Neck pain 12/23/2021   Neck nodule 11/11/2021   CVA (cerebral vascular accident) (HCC) 10/18/2021   AKI (acute kidney injury) (HCC) 09/13/2021   Weakness 09/04/2021   COVID-19 virus infection 09/04/2021   Accelerating angina (HCC) 02/04/2021   Abnormal stress test    Cardiomyopathy (HCC)     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   CAD (coronary artery disease)    Depression    Arthritis 09/16/2019   Dehydration 03/14/2019   Sick sinus syndrome (HCC) 07/08/2016   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   Thrombocytopenia (HCC) 02/29/2016   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    PAD (peripheral artery disease) (HCC)    OSA on CPAP    Chronic diastolic CHF (congestive heart failure) (HCC) 09/20/2015   CLL (chronic lymphocytic leukemia) (HCC)    PTSD (post-traumatic stress disorder)    Osteoarthritis of both knees 07/05/2015   COPD (chronic obstructive pulmonary disease) (HCC) 01/01/2012   Shortness of breath 10/09/2011   Paroxysmal atrial fibrillation (HCC) 10/09/2011   Hyperlipidemia 11/28/2009   Essential hypertension 11/28/2009    Past Surgical History:  Procedure Laterality Date   ABDOMINAL HERNIA REPAIR     APPENDECTOMY  06/21/1985   CORONARY ARTERY BYPASS GRAFT N/A 05/09/2009   EP  IMPLANTABLE DEVICE N/A 03/03/2016   Procedure: Pacemaker Implant;  Surgeon: Elspeth JAYSON Sage, MD;  Location: [facility] INVASIVE CV LAB;  Service: Cardiovascular;  Laterality: N/A;   FOREIGN BODY REMOVAL  1968   shrapnel in my tailbone   INGUINAL HERNIA REPAIR Right    JOINT REPLACEMENT Right 2018   LAPAROSCOPIC CHOLECYSTECTOMY     LEFT HEART CATH AND CORONARY ANGIOGRAPHY Left 05/08/2009   Procedure: LEFT HEART CATH AND CORONARY ANGIOGRAPHY; Location: [facility]: Surgeon: Darron Grass, MD   LEFT HEART CATH AND CORS/GRAFTS ANGIOGRAPHY Left 10/17/2009   Procedure: LEFT HEART CATH AND CORS/GRAFTS ANGIOGRAPHY; Location: [facility]; Surgeon: Darron Grass, MD   RIGHT/LEFT HEART CATH AND CORONARY/GRAFT ANGIOGRAPHY N/A 02/04/2021   Procedure: RIGHT/LEFT HEART CATH AND CORONARY/GRAFT ANGIOGRAPHY;  Surgeon: Mady Bruckner, MD;  Location: [facility] INVASIVE CV LAB;  Service: Cardiovascular;  Laterality: N/A;   TONSILLECTOMY AND ADENOIDECTOMY  1956   TOTAL HIP  ARTHROPLASTY Right 10/22/2016   Procedure: TOTAL HIP ARTHROPLASTY;  Surgeon: Lynwood SHAUNNA Hue, MD;  Location: [facility] ORS;  Service: Orthopedics;  Laterality: Right;   TOTAL HIP ARTHROPLASTY Left 11/04/2017   Procedure: TOTAL HIP ARTHROPLASTY;  Surgeon: Hue Lynwood SHAUNNA, MD;  Location: [facility] ORS;  Service: Orthopedics;  Laterality: Left;       Home Medications    Prior to Admission medications   Medication Sig Start Date End Date Taking? Authorizing Provider  cephALEXin  (KEFLEX ) 500 MG capsule Take 2 capsules (1,000 mg total) by mouth 2 (two) times daily for 7 days. 01/09/24 01/16/24 Yes Arvis Jolan NOVAK, PA-C  acalabrutinib  maleate (CALQUENCE ) 100 MG tablet Take 1 tablet (100 mg total) by mouth 2 (two) times daily. 12/15/23   Brahmanday, Govinda R, MD  acetaminophen  (TYLENOL ) 325 MG tablet Take 1-2 tablets (325-650 mg total) by mouth every 4 (four) hours as needed for mild pain. 11/01/21   Setzer, Sandra J, PA-C  albuterol  (VENTOLIN  HFA) 108 (90 Base) MCG/ACT inhaler Inhale 2 puffs into the lungs every 6 (six) hours as needed for wheezing or shortness of breath. 11/01/21   Setzer, Sandra J, PA-C  amiodarone  (PACERONE ) 200 MG tablet TAKE 1/2 TABLET BY MOUTH TWICE DAILY FOR 5 DAYS A WEEK 12/18/23   Sage Elspeth JAYSON, MD  apixaban  (ELIQUIS ) 5 MG TABS tablet Take 1 tablet (5 mg total) by mouth 2 (two) times daily. 10/28/23   Gerard Frederick, NP  Apple Cider Vinegar 500 MG TABS Take 1 tablet by mouth daily.    [provider]  azelastine  (ASTELIN ) 0.1 % nasal spray Place 2 sprays into both nostrils 2 (two) times daily. Use in each nostril as directed 03/10/23   Gretel App, NP  cetirizine (ZYRTEC) 10 MG tablet Take 10 mg by mouth daily as needed for allergies.     [provider]  Cholecalciferol (VITAMIN D3) 250 MCG (10000 UT) capsule Take 10,000 Units by mouth daily.    [provider]  Coenzyme Q10 (COQ10) 200 MG CAPS Take 200 mg by mouth daily.    [provider]   cyanocobalamin  (VITAMIN B12) 1000 MCG tablet Take 1,000 mcg by mouth daily.    [provider]  doxycycline  (VIBRAMYCIN ) 100 MG capsule Take 1 capsule (100 mg total) by mouth 2 (two) times daily. 01/06/24   Brimage, Vondra, DO  ezetimibe  (ZETIA ) 10 MG tablet Take 1 tablet (10 mg total) by mouth daily. Patient taking differently: Take 10 mg by mouth every morning. 01/06/17 11/01/27  Gollan, Timothy J, MD  ferrous sulfate  325 (65 FE) MG EC  tablet Take 325 mg by mouth daily.    [provider]  fluticasone -salmeterol (WIXELA INHUB) 100-50 MCG/ACT AEPB Inhale 2 puffs into the lungs 2 (two) times daily.    [provider]  ipratropium-albuterol  (DUONEB) 0.5-2.5 (3) MG/3ML SOLN Take 3 mLs by nebulization every 6 (six) hours as needed (shorntess of breath). 09/24/23   Arvis Jolan NOVAK, PA-C  mirtazapine  (REMERON ) 15 MG tablet Take 15 mg by mouth at bedtime as needed (for panic associated with PTSD).    [provider]  mometasone -formoterol  (DULERA ) 200-5 MCG/ACT AERO Inhale 2 puffs into the lungs 2 (two) times daily.    [provider]  Multiple Vitamin (MULTIVITAMIN WITH MINERALS) TABS tablet Take 1 tablet by mouth daily.    [provider]  pantoprazole  (PROTONIX ) 40 MG tablet Take 1 tablet (40 mg total) by mouth daily. 10/21/23   Marylynn Verneita CROME, MD  REPATHA  SURECLICK 140 MG/ML SOAJ ADMINISTER 1 ML UNDER THE SKIN EVERY 14 DAYS 10/19/23   Gollan, Timothy J, MD  tamsulosin  (FLOMAX ) 0.4 MG CAPS capsule TAKE 1 CAPSULE(0.4 MG) BY MOUTH DAILY 05/12/23   Maribeth Camellia MATSU, MD  Tiotropium Bromide  Monohydrate 2.5 MCG/ACT AERS Inhale 2 puffs into the lungs at bedtime. Spiriva  08/07/20   [provider]  tiZANidine  (ZANAFLEX ) 4 MG capsule Take 1 capsule (4 mg total) by mouth 3 (three) times daily. This can make you sleepy. 04/24/23   Hilma Hastings, PA-C  traMADol  (ULTRAM ) 50 MG tablet Take 50 mg by mouth 2 (two) times daily.    [provider]     Family History Family History  Problem Relation Age of Onset   Heart disease Mother    Heart attack Mother    Coronary artery disease Other        family history    Social History Social History   Tobacco Use   Smoking status: Former    Current packs/day: 0.00    Average packs/day: 1 pack/day for 40.0 years (40.0 ttl pk-yrs)    Types: Cigarettes    Start date: 07/21/1966    Quit date: 07/21/2006    Years since quitting: 17.4   Smokeless tobacco: Never  Vaping Use   Vaping status: Former  Substance Use Topics   Alcohol use: Not Currently   Drug use: No     Allergies   Atorvastatin  and Augmentin  [amoxicillin -pot clavulanate]   Review of Systems Review of Systems  Constitutional:  Negative for fatigue and fever.  Respiratory:  Negative for shortness of breath.   Cardiovascular:  Negative for chest pain and leg swelling.  Musculoskeletal:  Positive for arthralgias. Negative for joint swelling.  Skin:  Positive for color change, rash and wound.  Neurological:  Negative for weakness and numbness.     Physical Exam Triage Vital Signs ED Triage Vitals  Encounter Vitals Group     BP 01/09/24 1220 (!) 170/89     Girls Systolic BP Percentile --      Girls Diastolic BP Percentile --      Boys Systolic BP Percentile --      Boys Diastolic BP Percentile --      Pulse Rate 01/09/24 1220 64     Resp 01/09/24 1220 14     Temp 01/09/24 1220 97.8 F (36.6 C)     Temp Source 01/09/24 1220 Oral     SpO2 01/09/24 1220 98 %     Weight 01/09/24 1218 205 lb 4 oz (93.1 kg)  Height 01/09/24 1218 5' 9 (1.753 m)     Head Circumference --      Peak Flow --      Pain Score 01/09/24 1218 6     Pain Loc --      Pain Education --      Exclude from Growth Chart --    No data found.  Updated Vital Signs BP (!) 170/89 (BP Location: Right Arm)   Pulse 64   Temp 97.8 F (36.6 C) (Oral)   Resp 14   Ht 5' 9 (1.753 m)   Wt 205 lb 4 oz (93.1 kg)   SpO2 98%   BMI 30.31 kg/m     Physical Exam Vitals and nursing note reviewed.  Constitutional:      General: He is not in acute distress.    Appearance: Normal appearance. He is well-developed. He is not ill-appearing.  HENT:     Head: Normocephalic and atraumatic.   Eyes:     Conjunctiva/sclera: Conjunctivae normal.    Cardiovascular:     Rate and Rhythm: Normal rate and regular rhythm.     Pulses: Normal pulses.  Pulmonary:     Effort: Pulmonary effort is normal. No respiratory distress.     Breath sounds: Normal breath sounds.   Musculoskeletal:     Cervical back: Neck supple.   Skin:    General: Skin is warm and dry.     Capillary Refill: Capillary refill takes less than 2 seconds.     Findings: Erythema present.     Comments: RIGHT LOWER LEG: See images included in chart. There is a wound with dried drainage and surrounding erythema, swelling and tenderness. It has extended to touch the line drawn. Petechial rash of medial ankle now.   Neurological:     General: No focal deficit present.     Mental Status: He is alert. Mental status is at baseline.     Motor: No weakness.     Gait: Gait normal.   Psychiatric:        Mood and Affect: Mood normal.        Behavior: Behavior normal.         UC Treatments / Results  Labs (all labs ordered are listed, but only abnormal results are displayed) Labs Reviewed - No data to display  EKG   Radiology No results found.  Procedures Procedures (including critical care time)  Medications Ordered in UC Medications - No data to display  Initial Impression / Assessment and Plan / UC Course  I have reviewed the triage vital signs and the nursing notes.  Pertinent labs & imaging results that were available during my care of the patient were reviewed by me and considered in my medical decision making (see chart for details).   78 year old male presents for redness, swelling and pain of the right lower extremity.  Patient seen here 3 days ago  by another provider and given doxycycline .  No real improvement in symptoms.  Symptoms have worsened a little.  No fever or drainage from the area.  Blood pressure elevated 170/89.  Patient advised to continue hypertensive medications and keep an eye on blood pressure.  If consistently greater than 140/90 should follow-up with PCP/cardiology.  Compared images from 3 days ago and today.  The area of redness and swelling has mildly worsened.  There is induration around a wound but there is no fluctuance.  I&D not indicated based on exam.  I did however advise patient that  he looks to have failed outpatient oral medications and it would be advised that he go to the emergency department for consideration of IV antibiotics.  May also need ultrasound to look for deeper abscess.  Patient declines go to the ER.  He would like to try adding another antibiotic first to see if that will help.  I sent Keflex  to pharmacy.  Advised continuing warm compresses on the area.  Advised if no improvement in 24 hours, fever or worsening symptoms he should go immediately to the emergency department as he will need IV antibiotics.  Advised not to return to our department if things are worsening as we do not have IV antibiotics in the urgent care setting.  Patient is understanding and agreeable.   Final Clinical Impressions(s) / UC Diagnoses   Final diagnoses:  Cellulitis of leg, right  Essential hypertension     Discharge Instructions      - Continue taking the doxycycline .  I added Keflex  to your medication regimen.  If you are not having any improvement in the next 24 hours you must go to the emergency department because you will need IV medication.  You did not want to go at this time so we can give it another 24 hours but if symptoms worsen or are not improving in 24 hours you will need to go. - Continue to elevate extremity and apply warm compresses.    ED Prescriptions     Medication Sig Dispense Auth.  Provider   cephALEXin  (KEFLEX ) 500 MG capsule Take 2 capsules (1,000 mg total) by mouth 2 (two) times daily for 7 days. 28 capsule Arvis Jolan NOVAK, PA-C      PDMP not reviewed this encounter.   Arvis Jolan NOVAK, PA-C 01/09/24 1302

## 2024-01-09 NOTE — Discharge Instructions (Addendum)
-   Continue taking the doxycycline .  I added Keflex  to your medication regimen.  If you are not having any improvement in the next 24 hours you must go to the emergency department because you will need IV medication.  You did not want to go at this time so we can give it another 24 hours but if symptoms worsen or are not improving in 24 hours you will need to go. - Continue to elevate extremity and apply warm compresses.

## 2024-01-13 ENCOUNTER — Encounter: Payer: Self-pay | Admitting: Nurse Practitioner

## 2024-01-13 DIAGNOSIS — L03115 Cellulitis of right lower limb: Secondary | ICD-10-CM | POA: Insufficient documentation

## 2024-01-13 NOTE — Assessment & Plan Note (Signed)
 Adequately controlled today in office. Not on any medications. We will continue to monitor.

## 2024-01-13 NOTE — Assessment & Plan Note (Signed)
 He is under oncology care with a follow-up scheduled on the 27th. Continue current management and follow up with oncology on the 27th.

## 2024-01-13 NOTE — Assessment & Plan Note (Addendum)
 The spider bite has developed into cellulitis, presenting with erythema, swelling and a hole. Redness is improving, but swelling is slowly increasing. Doxycycline  was started less than 24 hours ago. He has no fever or chills. Continue doxycycline , apply ice, and elevate the area. Follow up with urgent care in 4 days. Strict precautions given to patient.

## 2024-01-13 NOTE — Assessment & Plan Note (Signed)
 He experiences shortness of breath and a morning cough with phlegm. He uses inhalers and a nebulizer and is under pulmonology care. There have been no recent exacerbations. Continue inhalers and nebulizer. Follow up with pulmonology as scheduled.

## 2024-01-13 NOTE — Assessment & Plan Note (Signed)
 He is taking Flomax  with no new symptoms. Continue Flomax .

## 2024-01-13 NOTE — Assessment & Plan Note (Addendum)
 He is on Eliquis  and amiodarone  and is under cardiology care. Continue Eliquis  and amiodarone . Follow up with cardiology as scheduled.

## 2024-01-13 NOTE — Assessment & Plan Note (Signed)
 He is on Repatha  and Zetia , with Repatha  administered every 14 days. Continue Repatha  and Zetia . Check lipid panel and CMP today.

## 2024-01-15 ENCOUNTER — Inpatient Hospital Stay (HOSPITAL_BASED_OUTPATIENT_CLINIC_OR_DEPARTMENT_OTHER): Admitting: Internal Medicine

## 2024-01-15 ENCOUNTER — Encounter: Payer: Self-pay | Admitting: Internal Medicine

## 2024-01-15 ENCOUNTER — Other Ambulatory Visit: Payer: Self-pay | Admitting: Internal Medicine

## 2024-01-15 ENCOUNTER — Inpatient Hospital Stay: Attending: Internal Medicine

## 2024-01-15 VITALS — BP 132/62 | HR 62 | Temp 98.1°F | Resp 20 | Ht 69.0 in | Wt 203.4 lb

## 2024-01-15 DIAGNOSIS — C9110 Chronic lymphocytic leukemia of B-cell type not having achieved remission: Secondary | ICD-10-CM | POA: Diagnosis not present

## 2024-01-15 DIAGNOSIS — Z79899 Other long term (current) drug therapy: Secondary | ICD-10-CM | POA: Diagnosis not present

## 2024-01-15 DIAGNOSIS — C9112 Chronic lymphocytic leukemia of B-cell type in relapse: Secondary | ICD-10-CM | POA: Diagnosis present

## 2024-01-15 DIAGNOSIS — D649 Anemia, unspecified: Secondary | ICD-10-CM | POA: Insufficient documentation

## 2024-01-15 DIAGNOSIS — K219 Gastro-esophageal reflux disease without esophagitis: Secondary | ICD-10-CM

## 2024-01-15 LAB — CBC WITH DIFFERENTIAL (CANCER CENTER ONLY)
Abs Immature Granulocytes: 0.02 K/uL (ref 0.00–0.07)
Basophils Absolute: 0 K/uL (ref 0.0–0.1)
Basophils Relative: 0 %
Eosinophils Absolute: 0.1 K/uL (ref 0.0–0.5)
Eosinophils Relative: 1 %
HCT: 35 % — ABNORMAL LOW (ref 39.0–52.0)
Hemoglobin: 11.3 g/dL — ABNORMAL LOW (ref 13.0–17.0)
Immature Granulocytes: 0 %
Lymphocytes Relative: 60 %
Lymphs Abs: 4.3 K/uL — ABNORMAL HIGH (ref 0.7–4.0)
MCH: 33.4 pg (ref 26.0–34.0)
MCHC: 32.3 g/dL (ref 30.0–36.0)
MCV: 103.6 fL — ABNORMAL HIGH (ref 80.0–100.0)
Monocytes Absolute: 0.6 K/uL (ref 0.1–1.0)
Monocytes Relative: 8 %
Neutro Abs: 2.3 K/uL (ref 1.7–7.7)
Neutrophils Relative %: 31 %
Platelet Count: 147 K/uL — ABNORMAL LOW (ref 150–400)
RBC: 3.38 MIL/uL — ABNORMAL LOW (ref 4.22–5.81)
RDW: 13.3 % (ref 11.5–15.5)
WBC Count: 7.4 K/uL (ref 4.0–10.5)
nRBC: 0 % (ref 0.0–0.2)

## 2024-01-15 LAB — CMP (CANCER CENTER ONLY)
ALT: 15 U/L (ref 0–44)
AST: 18 U/L (ref 15–41)
Albumin: 3.9 g/dL (ref 3.5–5.0)
Alkaline Phosphatase: 82 U/L (ref 38–126)
Anion gap: 8 (ref 5–15)
BUN: 20 mg/dL (ref 8–23)
CO2: 26 mmol/L (ref 22–32)
Calcium: 8.7 mg/dL — ABNORMAL LOW (ref 8.9–10.3)
Chloride: 106 mmol/L (ref 98–111)
Creatinine: 1.06 mg/dL (ref 0.61–1.24)
GFR, Estimated: >60 mL/min (ref >60)
Glucose, Bld: 116 mg/dL — ABNORMAL HIGH (ref 70–99)
Potassium: 3.9 mmol/L (ref 3.5–5.1)
Sodium: 140 mmol/L (ref 135–145)
Total Bilirubin: 0.8 mg/dL (ref 0.0–1.2)
Total Protein: 6.8 g/dL (ref 6.5–8.1)

## 2024-01-15 LAB — LACTATE DEHYDROGENASE: LDH: 127 U/L (ref 98–192)

## 2024-01-15 LAB — VITAMIN B12: Vitamin B-12: 1617 pg/mL — ABNORMAL HIGH (ref 180–914)

## 2024-01-15 NOTE — Progress Notes (Signed)
 C/o of a bite/sting on rt lower leg. Treating with keflex .  C/o stool being black since on iron.

## 2024-01-15 NOTE — Progress Notes (Signed)
 [facility] OFFICE PROGRESS NOTE  Patient Care Team: Gretel App, NP as PCP - General (Nurse Practitioner) Perla Evalene PARAS, MD as PCP - Cardiology (Cardiology) Fernande Elspeth BROCKS, MD as PCP - Electrophysiology (Cardiology) Perla Evalene PARAS, MD as Consulting Physician (Cardiology) Rennie Cindy SAUNDERS, MD as Medical Oncologist (Hematology and Oncology) Bula Powell PARAS, RN as Registered Nurse (Oncology)   Cancer Staging  No matching staging information was found for the patient.    Oncology History Overview Note  # AUG 2015- SLL/CLL [Right Ax Ln Bx] s/p Benda-Rituxan  x6 [finished March 2016]; Maintenance Rituxan  q 20m [started April 2016; Dr.Pandit];Last Ritux Jan 2017.  MARCH 2017- CT N/C/A/P- NED. STOP Ritux; surveillance   # AUG 2019- CT/PET- progression/NO transformation;  # NOV 2019- Progression; started Ibrutinib  420 mg/d. STOPPED in end of feb sec to extreme fatigue/joint pains/cramps  #November 01, 2018-start ibrutinib  280 mg a day; December 20, 2018-discontinue ibrutinib  secondary multiple side effects.   #January 03, 2019 start Gazyva  + Ven [July]; finished Dec 2020-; started Ven maintenance [200 mg a day; DI-amiodarone ]; SEP 2022- congestive heart failure; s/p cardiac cath-noted to have CAD however no stents placed.  2D echo- EF-55%  #Mid March 2021-venetoclax  dose reduced to 100 mg [second diarrhea]. HELD in DEC 2022/JAN 2023 x3 months-multiple admissions to the hospital/pneumonia infection stroke x 3 m.   # MAY 2023-progression of disease in the neck ches; Nov 18, 2021-RE-started venetoclax ;   # JUNE 29th, 2023-progression of disease in the neck- STOP VENAOCLAX;   # AUG 16th, 2023 START ACALABRUTINIB  100 mg BID [previous intolerance to ibrutinib ; delayed sec to C.diff]    #? 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 Ambulating with cane.    [name] 78 y.o.  male CLL-high risk of recurrent/relapsed currently on  alcabrutinib; afib on eliquis , COPD-  is here for a follow up.  C/o of a bite/sting on rt lower leg. Treating with keflex . C/o stool being black since on iron. No nausea or vomiting.   Patient has concerns with bruises on back of lower extremities. No night sweats.  Appetite is normal. Energy is up and down. Complains of chronic joint pain.  Otherwise no fever no chills.   No headache.  Review of Systems  Constitutional:  Positive for malaise/fatigue. Negative for chills, diaphoresis and fever.  HENT:  Negative for nosebleeds and sore throat.   Eyes:  Negative for double vision.  Respiratory:  Negative for hemoptysis, shortness of breath and wheezing.   Cardiovascular:  Negative for chest pain, palpitations, orthopnea and leg swelling.  Gastrointestinal:  Negative for abdominal pain, blood in stool, constipation, heartburn, melena, nausea and vomiting.  Genitourinary:  Negative for dysuria, frequency and urgency.  Musculoskeletal:  Positive for back pain, joint pain and myalgias.  Skin: Negative.  Negative for itching and rash.  Neurological:  Negative for dizziness, tingling, focal weakness and  headaches.  Psychiatric/Behavioral:  Negative for depression. The patient is not nervous/anxious and does not have insomnia.    PAST MEDICAL HISTORY :  Past Medical History:  Diagnosis Date   Agent orange  exposure    Anxiety    Arthritis    Atrial fibrillation (HCC) 2013   a.) Dx'd in 2013; recurrence 02/2014; b.) CHA2DS2-VASc = 7 (age x2, HFimpEF, HTN, CVA x 2, previous MI); c.) cardiac rate/rhythm maintained on oral amiodarone ; chronically anticoagulated using apixaban    BPH (benign prostatic hyperplasia)    C. 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)    a.) MV 08/23/2015: EF 50%; b.) TTE 08/23/2015: EF 55-60%, G1DD; c.) TTE 11/02/2015: EF 55-60%, mild LA dil; d.) TTE 02/04/2020: EF 55-60%, mild LVH, G1DD, PASP 44.3, mild-mod AoV sclerosis; e.) MV 12/25/2020: EF 45-54%; f.)  R/LHC 02/04/2021: mRA 13, mPA 27, mPCWP 22, LVEDP 23, Ao sat 99, PA sat 70, CO 6.5, CI 3.0; g.) TTE 02/19/2021: EF 55-60%, G1DD, mildly red RVSF, triv MR, mild-mod AoV scler   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 EF 55%, mild antlat HK;  c.) s/p R/LHC 02/04/2021: LM mild diff dzs, LAD 100p, LCX nl, RCA 50p, 95d, 99 side branch, RPAV fills via LCx. SVG->RPDA 100, SVG->OM1 100, LIMA->LAD patent. EF 35-45%.   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    Hyperlipidemia LDL goal <70     Hypertension    Hypokalemia    Ischemic cardiomyopathy    a.) MV 08/23/2015: EF 50%; b.) TTE 08/23/2015: EF 55-60%; c.) TTE 11/02/2015: EF 55-60%; d.) TTE 02/04/2020: EF 55-60%; e.) MV 12/25/2020: EF 45-54%; f.) R/LHC 02/04/2021: mRA 13, mPA 27, mPCWP 22, LVEDP 23, Ao sat 99, PA sat 70, CO 6.5, CI 3.0; g.) TTE 02/19/2021: EF 55-60%   Ischemic cerebrovascular accident (CVA) (HCC) 10/18/2021   Myocardial infarction (HCC) 2010   On amiodarone  therapy    On apixaban  therapy    OSA on CPAP    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   TIA (transient ischemic attack) 11/02/2015   PAST SURGICAL HISTORY :   Past Surgical History:  Procedure Laterality Date   ABDOMINAL HERNIA REPAIR     APPENDECTOMY  06/21/1985   CORONARY ARTERY BYPASS GRAFT N/A 05/09/2009   EP IMPLANTABLE DEVICE N/A 03/03/2016   Procedure: Pacemaker Implant;  Surgeon: Elspeth JAYSON Sage, MD;  Location: [facility] INVASIVE CV LAB;  Service: Cardiovascular;  Laterality: N/A;   FOREIGN BODY REMOVAL  1968   shrapnel in my tailbone   INGUINAL HERNIA REPAIR Right    JOINT REPLACEMENT Right 2018   LAPAROSCOPIC CHOLECYSTECTOMY     LEFT HEART CATH AND CORONARY ANGIOGRAPHY Left 05/08/2009   Procedure: LEFT HEART CATH AND CORONARY ANGIOGRAPHY; Location: [facility]: Surgeon: Darron Grass, MD   LEFT HEART CATH AND CORS/GRAFTS ANGIOGRAPHY Left 10/17/2009   Procedure: LEFT HEART CATH AND  CORS/GRAFTS ANGIOGRAPHY; Location: [facility]; Surgeon: Darron Grass, MD   RIGHT/LEFT HEART CATH AND CORONARY/GRAFT ANGIOGRAPHY N/A 02/04/2021   Procedure: RIGHT/LEFT HEART CATH AND CORONARY/GRAFT ANGIOGRAPHY;  Surgeon: Mady Bruckner, MD;  Location: [facility] INVASIVE CV LAB;  Service: Cardiovascular;  Laterality: N/A;   TONSILLECTOMY AND ADENOIDECTOMY  1956   TOTAL HIP ARTHROPLASTY Right 10/22/2016   Procedure: TOTAL HIP ARTHROPLASTY;  Surgeon: Lynwood SHAUNNA Hue, MD;  Location: [facility] ORS;  Service: Orthopedics;  Laterality: Right;   TOTAL HIP ARTHROPLASTY Left 11/04/2017   Procedure: TOTAL HIP ARTHROPLASTY;  Surgeon: Hue Lynwood SHAUNNA, MD;  Location: [facility] ORS;  Service: Orthopedics;  Laterality: Left;    FAMILY HISTORY :   Family History  Problem Relation Age of Onset   Heart disease Mother    Heart attack Mother    Coronary artery disease Other        family history    SOCIAL HISTORY:   Social History   Tobacco Use   Smoking status: Former    Current packs/day: 0.00    Average packs/day: 1 pack/day for 40.0 years (40.0 ttl pk-yrs)    Types: Cigarettes    Start date: 07/21/1966    Quit date: 07/21/2006    Years since quitting: 17.4   Smokeless tobacco: Never  Vaping Use   Vaping status: Former  Substance Use Topics   Alcohol use: Not Currently   Drug use: No    ALLERGIES:  is allergic to atorvastatin  and augmentin  [amoxicillin -pot clavulanate].  MEDICATIONS:  Current Outpatient Medications  Medication Sig Dispense Refill   acalabrutinib  maleate (CALQUENCE ) 100 MG tablet Take 1 tablet (100 mg total) by mouth 2 (two) times daily. 60 tablet 2   acetaminophen  (TYLENOL ) 325 MG tablet Take 1-2 tablets (325-650 mg total) by mouth every 4 (four) hours as needed for mild pain.     albuterol  (VENTOLIN  HFA) 108 (90 Base) MCG/ACT inhaler Inhale 2 puffs into the lungs every 6 (six) hours as needed for wheezing or shortness of breath. 1 each 0   amiodarone  (PACERONE ) 200 MG tablet TAKE 1/2 TABLET BY  MOUTH TWICE DAILY FOR 5 DAYS A WEEK 75 tablet 3   apixaban  (ELIQUIS ) 5 MG TABS tablet Take 1 tablet (5 mg total) by mouth 2 (two) times daily. 90 tablet 3   Apple Cider Vinegar 500 MG TABS Take 1 tablet by mouth daily.     azelastine  (ASTELIN ) 0.1 % nasal spray Place 2 sprays into both nostrils 2 (two) times daily. Use in each nostril as directed 30 mL 1   cephALEXin  (KEFLEX ) 500 MG capsule Take 2 capsules (1,000 mg total) by mouth 2 (  two) times daily for 7 days. 28 capsule 0   cetirizine (ZYRTEC) 10 MG tablet Take 10 mg by mouth daily as needed for allergies.      Cholecalciferol (VITAMIN D3) 250 MCG (10000 UT) capsule Take 10,000 Units by mouth daily.     Coenzyme Q10 (COQ10) 200 MG CAPS Take 200 mg by mouth daily.     cyanocobalamin  (VITAMIN B12) 1000 MCG tablet Take 1,000 mcg by mouth daily.     doxycycline  (VIBRAMYCIN ) 100 MG capsule Take 1 capsule (100 mg total) by mouth 2 (two) times daily. 20 capsule 0   ezetimibe  (ZETIA ) 10 MG tablet Take 1 tablet (10 mg total) by mouth daily. (Patient taking differently: Take 10 mg by mouth every morning.) 90 tablet 3   ferrous sulfate  325 (65 FE) MG EC tablet Take 325 mg by mouth daily.     fluticasone -salmeterol (WIXELA INHUB) 100-50 MCG/ACT AEPB Inhale 2 puffs into the lungs 2 (two) times daily.     ipratropium-albuterol  (DUONEB) 0.5-2.5 (3) MG/3ML SOLN Take 3 mLs by nebulization every 6 (six) hours as needed (shorntess of breath). 360 mL 0   mirtazapine  (REMERON ) 15 MG tablet Take 15 mg by mouth at bedtime as needed (for panic associated with PTSD).     mometasone -formoterol  (DULERA ) 200-5 MCG/ACT AERO Inhale 2 puffs into the lungs 2 (two) times daily.     Multiple Vitamin (MULTIVITAMIN WITH MINERALS) TABS tablet Take 1 tablet by mouth daily.     pantoprazole  (PROTONIX ) 40 MG tablet Take 1 tablet (40 mg total) by mouth daily. 90 tablet 0   REPATHA  SURECLICK 140 MG/ML SOAJ ADMINISTER 1 ML UNDER THE SKIN EVERY 14 DAYS 2 mL 11   tamsulosin  (FLOMAX ) 0.4  MG CAPS capsule TAKE 1 CAPSULE(0.4 MG) BY MOUTH DAILY 30 capsule 3   Tiotropium Bromide  Monohydrate 2.5 MCG/ACT AERS Inhale 2 puffs into the lungs at bedtime. Spiriva      tiZANidine  (ZANAFLEX ) 4 MG capsule Take 1 capsule (4 mg total) by mouth 3 (three) times daily. This can make you sleepy. 60 capsule 0   traMADol  (ULTRAM ) 50 MG tablet Take 50 mg by mouth 2 (two) times daily.     No current facility-administered medications for this visit.   Facility-Administered Medications Ordered in Other Visits  Medication Dose Route Frequency Provider Last Rate Last Admin   potassium chloride  10 mEq in 100 mL IVPB  10 mEq Intravenous Once Borders, Fonda SAUNDERS, NP        PHYSICAL EXAMINATION: ECOG PERFORMANCE STATUS: 1 - Symptomatic but completely ambulatory  BP 132/62 (BP Location: Left Arm, Patient Position: Sitting, Cuff Size: Large)   Pulse 62   Temp 98.1 F (36.7 C) (Tympanic)   Resp 20   Ht 5' 9 (1.753 m)   Wt 203 lb 6.4 oz (92.3 kg)   SpO2 100%   BMI 30.04 kg/m   Filed Weights   01/15/24 0953  Weight: 203 lb 6.4 oz (92.3 kg)    Physical Exam HENT:     Head: Normocephalic and atraumatic.     Mouth/Throat:     Pharynx: No oropharyngeal exudate.   Eyes:     Pupils: Pupils are equal, round, and reactive to light.    Cardiovascular:     Rate and Rhythm: Normal rate and regular rhythm.  Pulmonary:     Effort: No respiratory distress.     Breath sounds: No wheezing.  Abdominal:     General: Bowel sounds are normal. There is no distension.  Palpations: Abdomen is soft. There is no mass.     Tenderness: There is no abdominal tenderness. There is no guarding or rebound.   Musculoskeletal:        General: No tenderness. Normal range of motion.     Cervical back: Normal range of motion and neck supple.   Skin:    General: Skin is warm.   Neurological:     Mental Status: He is alert and oriented to person, place, and time.   Psychiatric:        Mood and Affect: Affect  normal.     LABORATORY DATA:  I have reviewed the data as listed    Component Value Date/Time   NA 140 01/15/2024 0955   NA 143 03/11/2021 1136   NA 139 10/11/2014 1800   K 3.9 01/15/2024 0955   K 3.3 (L) 10/11/2014 1800   CL 106 01/15/2024 0955   CL 106 10/11/2014 1800   CO2 26 01/15/2024 0955   CO2 27 10/11/2014 1800   GLUCOSE 116 (H) 01/15/2024 0955   GLUCOSE 107 (H) 10/11/2014 1800   BUN 20 01/15/2024 0955   BUN 13 03/11/2021 1136   BUN 15 10/11/2014 1800   CREATININE 1.06 01/15/2024 0955   CREATININE 0.89 10/11/2014 1800   CALCIUM  8.7 (L) 01/15/2024 0955   CALCIUM  8.8 (L) 10/11/2014 1800   PROT 6.8 01/15/2024 0955   PROT 6.7 05/18/2017 1048   PROT 6.4 (L) 10/11/2014 1800   ALBUMIN  3.9 01/15/2024 0955   ALBUMIN  4.3 05/18/2017 1048   ALBUMIN  4.1 10/11/2014 1800   AST 18 01/15/2024 0955   ALT 15 01/15/2024 0955   ALT 22 10/11/2014 1800   ALKPHOS 82 01/15/2024 0955   ALKPHOS 61 10/11/2014 1800   BILITOT 0.8 01/15/2024 0955   GFRNONAA >60 01/15/2024 0955   GFRNONAA >60 10/11/2014 1800   GFRAA >60 04/12/2020 0959   GFRAA >60 10/11/2014 1800    No results found for: SPEP, UPEP  Lab Results  Component Value Date   WBC 7.4 01/15/2024   NEUTROABS 2.3 01/15/2024   HGB 11.3 (L) 01/15/2024   HCT 35.0 (L) 01/15/2024   MCV 103.6 (H) 01/15/2024   PLT 147 (L) 01/15/2024      Chemistry      Component Value Date/Time   NA 140 01/15/2024 0955   NA 143 03/11/2021 1136   NA 139 10/11/2014 1800   K 3.9 01/15/2024 0955   K 3.3 (L) 10/11/2014 1800   CL 106 01/15/2024 0955   CL 106 10/11/2014 1800   CO2 26 01/15/2024 0955   CO2 27 10/11/2014 1800   BUN 20 01/15/2024 0955   BUN 13 03/11/2021 1136   BUN 15 10/11/2014 1800   CREATININE 1.06 01/15/2024 0955   CREATININE 0.89 10/11/2014 1800      Component Value Date/Time   CALCIUM  8.7 (L) 01/15/2024 0955   CALCIUM  8.8 (L) 10/11/2014 1800   ALKPHOS 82 01/15/2024 0955   ALKPHOS 61 10/11/2014 1800   AST 18  01/15/2024 0955   ALT 15 01/15/2024 0955   ALT 22 10/11/2014 1800   BILITOT 0.8 01/15/2024 0955       RADIOGRAPHIC STUDIES: I have personally reviewed the radiological images as listed and agreed with the findings in the report. No results found.   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  Patient currently on Acalabrutnib [AUG 16th, 2023]-  Patient admits to compliance; March- April CT CAP:  Decreased  size of lymph nodes above and below the diaphragm; No splenomegaly. -Continue Acalbrutinib 100 mg BID.   # Clinically stable; no evidence of any progression -continue monitoring every 2 month. Will repeat scan in next 2-4 months- will order  at next visit  # Neck pain/Joint pains/ Muscle pain- s/p evluation with  Dr.Chasnis/ Dr.Brandon Claudene; [facility]. S/p  surgery [AUG 2024]- stable .   # Easy bruising [posterior Legs]sec Eliquis  + Acalabrutinib + se- stable   # Mild to moderate anemia:[>>> Colo] Continue gentle iron/slow iron.Hb- 12.3- improving-     # Low immunoglobulins IgG 330/Hx  severe respiratory infection/CLL-s/pI IVIG #4/4 [last April 2022].MARCH 2024-- IgG > 1500- stable   # COPD [Dr.Kasa]-chronic cough sputum.  No clinical evidence of any acute exacerbation at this time- stable   # A.fib-on amiodarone  [ Dr.Gollan] sinus rhythm-on Eliquis /CHF- lasix  40/d- s/p Cath [AUG, 2022- Echo-55%] stable   # Left sided CVA- [s/p rehab; April 2023]- stable  # Vaccination: s/p Flu shot [2024]; recommend COVID/RSV  DISPOSITION: # follow up in 2 month-  MD; labs- cbc/cmp; LDH; B12- Dr.B      Orders Placed This Encounter  Procedures   CBC with Differential (Cancer Center Only)    Standing Status:   Future    Expected Date:   03/18/2024    Expiration Date:   06/16/2024   CMP (Cancer Center only)    Standing Status:   Future    Expected Date:   03/18/2024    Expiration Date:   06/16/2024   Lactate  dehydrogenase    Standing Status:   Future    Expected Date:   03/18/2024    Expiration Date:   06/16/2024   Vitamin B12    Standing Status:   Future    Expected Date:   03/18/2024    Expiration Date:   06/16/2024   All questions were answered. The patient knows to call the clinic with any problems, questions or concerns.     Cindy JONELLE Joe, MD 01/15/2024 10:45 AM

## 2024-01-15 NOTE — Assessment & Plan Note (Signed)
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  Patient currently on Acalabrutnib [AUG 16th, 2023]-  Patient admits to compliance; March- April CT CAP:  Decreased size of lymph nodes above and below the diaphragm; No splenomegaly. -Continue Acalbrutinib 100 mg BID.   # Clinically stable; no evidence of any progression -continue monitoring every 2 month. Will repeat scan in next 2-4 months- will order  at next visit  # Neck pain/Joint pains/ Muscle pain- s/p evluation with  Dr.Chasnis/ Dr.Brandon Claudene; [facility]. S/p  surgery [AUG 2024]- stable .   # Easy bruising [posterior Legs]sec Eliquis  + Acalabrutinib + se- stable   # Mild to moderate anemia:[>>> Colo] Continue gentle iron/slow iron.Hb- 12.3- improving-     # Low immunoglobulins IgG 330/Hx  severe respiratory infection/CLL-s/pI IVIG #4/4 [last April 2022].MARCH 2024-- IgG > 1500- stable   # COPD [Dr.Kasa]-chronic cough sputum.  No clinical evidence of any acute exacerbation at this time- stable   # A.fib-on amiodarone  [ Dr.Gollan] sinus rhythm-on Eliquis /CHF- lasix  40/d- s/p Cath [AUG, 2022- Echo-55%] stable   # Left sided CVA- [s/p rehab; April 2023]- stable  # Vaccination: s/p Flu shot [2024]; recommend COVID/RSV  DISPOSITION: # follow up in 2 month-  MD; labs- cbc/cmp; LDH; B12- Dr.B

## 2024-01-18 ENCOUNTER — Other Ambulatory Visit (HOSPITAL_COMMUNITY): Payer: Self-pay

## 2024-01-19 ENCOUNTER — Other Ambulatory Visit: Payer: Self-pay

## 2024-01-20 ENCOUNTER — Other Ambulatory Visit: Payer: Self-pay

## 2024-01-22 ENCOUNTER — Encounter: Payer: Self-pay | Admitting: Internal Medicine

## 2024-01-25 ENCOUNTER — Other Ambulatory Visit: Payer: Self-pay | Admitting: *Deleted

## 2024-01-25 ENCOUNTER — Other Ambulatory Visit: Payer: Self-pay

## 2024-01-25 ENCOUNTER — Other Ambulatory Visit (HOSPITAL_COMMUNITY): Payer: Self-pay

## 2024-01-25 ENCOUNTER — Encounter: Payer: Self-pay | Admitting: Internal Medicine

## 2024-01-25 DIAGNOSIS — C9110 Chronic lymphocytic leukemia of B-cell type not having achieved remission: Secondary | ICD-10-CM

## 2024-01-25 MED ORDER — CALQUENCE 100 MG PO TABS
100.0000 mg | ORAL_TABLET | Freq: Two times a day (BID) | ORAL | 2 refills | Status: DC
  Filled 2024-01-25: qty 60, 30d supply, fill #0
  Filled 2024-02-17: qty 60, 30d supply, fill #1
  Filled 2024-03-11: qty 60, 30d supply, fill #2

## 2024-01-25 NOTE — Progress Notes (Signed)
 Specialty Pharmacy Refill Coordination Note  Spoke with [name] is a 78 y.o. male contacted today regarding refills of specialty medication(s) Acalabrutinib  Maleate (Calquence )  Patient requested: Delivery   Delivery date: 01/26/24   Verified address: [address]  [place] [address]  Medication will be filled on 01/25/24.

## 2024-01-26 ENCOUNTER — Other Ambulatory Visit (HOSPITAL_COMMUNITY): Payer: Self-pay

## 2024-01-26 NOTE — Progress Notes (Signed)
 Specialty Pharmacy Ongoing Clinical Assessment Note  [name] is a 78 y.o. male who is being followed by the specialty pharmacy service for RxSp Oncology   Patient's specialty medication(s) reviewed today: Acalabrutinib  Maleate (Calquence )   Missed doses in the last 4 weeks: 0   Patient/Caregiver did not have any additional questions or concerns.   Therapeutic benefit summary: Patient is achieving benefit   Adverse events/side effects summary: No adverse events/side effects   Patient's therapy is appropriate to: Continue    Goals Addressed             This Visit's Progress    Slow Disease Progression   On track    Patient is on track. Patient will maintain adherence. Per provider notes from 01/15/24 patient is clinically stable; no evidence of any progression.          Follow up: 6 months  Silvano LOISE Dolly Specialty Pharmacist

## 2024-01-27 ENCOUNTER — Ambulatory Visit: Admitting: Cardiology

## 2024-02-02 ENCOUNTER — Ambulatory Visit: Payer: Medicare HMO

## 2024-02-02 DIAGNOSIS — I495 Sick sinus syndrome: Secondary | ICD-10-CM

## 2024-02-03 LAB — CUP PACEART REMOTE DEVICE CHECK
Battery Remaining Longevity: 28 mo
Battery Voltage: 2.94 V
Brady Statistic AP VP Percent: 3.49 %
Brady Statistic AP VS Percent: 84.7 %
Brady Statistic AS VP Percent: 0.03 %
Brady Statistic AS VS Percent: 11.77 %
Brady Statistic RA Percent Paced: 88.21 %
Brady Statistic RV Percent Paced: 3.7 %
Date Time Interrogation Session: 20250715085409
Implantable Lead Connection Status: 753985
Implantable Lead Connection Status: 753985
Implantable Lead Implant Date: 20170814
Implantable Lead Implant Date: 20170814
Implantable Lead Location: 753859
Implantable Lead Location: 753860
Implantable Lead Model: 5076
Implantable Lead Model: 5076
Implantable Pulse Generator Implant Date: 20170814
Lead Channel Impedance Value: 323 Ohm
Lead Channel Impedance Value: 437 Ohm
Lead Channel Impedance Value: 437 Ohm
Lead Channel Impedance Value: 513 Ohm
Lead Channel Pacing Threshold Amplitude: 0.75 V
Lead Channel Pacing Threshold Amplitude: 0.75 V
Lead Channel Pacing Threshold Pulse Width: 0.4 ms
Lead Channel Pacing Threshold Pulse Width: 0.4 ms
Lead Channel Sensing Intrinsic Amplitude: 18.625 mV
Lead Channel Sensing Intrinsic Amplitude: 18.625 mV
Lead Channel Sensing Intrinsic Amplitude: 2.25 mV
Lead Channel Sensing Intrinsic Amplitude: 2.25 mV
Lead Channel Setting Pacing Amplitude: 2 V
Lead Channel Setting Pacing Amplitude: 2.5 V
Lead Channel Setting Pacing Pulse Width: 0.4 ms
Lead Channel Setting Sensing Sensitivity: 0.9 mV
Zone Setting Status: 755011
Zone Setting Status: 755011

## 2024-02-06 NOTE — Progress Notes (Unsigned)
 . Cardiology Office Note  Date:  02/08/2024   ID:  [name], DOB 1946/04/30, MRN [id number]  PCP:  Michael App, NP   Chief Complaint  Patient presents with   Follow-up    12 Month f/u c/o sob. Meds reviewed verbally with pt.    HPI:  [name] is a 78 year old gentleman with a history of  smoking, coronary artery disease, cabg,  catheterization March 2011 with occluded vein graft 2, patent LIMA, collaterals from left to right,  atrial fibrillation March 2013, 10/11/2014. Converted to normal with metoprolol ,  Possible conversion pause. 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  Last seen in clinic by myself  3/24 Seen by one of our providers April 2025  In general reports doing well Weight trending downward over the past year  Getting over cellulitis on the right leg calf area Was in the hospital for antibiotics, Better now  BP low today, denies orthostasis symptoms  Echo May 2025 EF 50 to 55%  Affected by the heat, unable to get very far or spend much time outdoors Used to do yard work, play on his tracto, now no energy  Uses a scooter/ramp Walker inside no recent falls  on cancer treatment,On Acalbrutinib 100 mg BID.  Lab work reviewed, WBC trending downward  Off metoprolol , remains on amiodarone  100 twice daily  On zetia  and repatha   EKG personally reviewed by myself on todays visit EKG Interpretation Date/Time:  Monday February 08 2024 11:02:53 EDT Ventricular Rate:  67 PR Interval:  220 QRS Duration:  96 QT Interval:  426 QTC Calculation: 450 R Axis:   54  Text Interpretation: Sinus rhythm with 1st degree A-V block Nonspecific ST abnormality When compared with ECG of 28-Oct-2023 08:52, Sinus rhythm has replaced Electronic atrial pacemaker T wave inversion no longer  evident in Anterior leads Confirmed by [name] [phone]) on 02/08/2024 11:16:27 AM    Other past medical history reviewed Presenting to the hospital 10/18/2021 for left-sided weakness.   outside the window for thrombolytics.  EKG was unremarkable.   CTA negative for stroke.  Unable to perform MRI due to patient's pacemaker.  CTA of head and neck performed without evidence of LVO.  Neurology consulted and patient likely had suffered ischemic stroke.  Eliquis  held and aspirin  continued.  Eliquis  was started 5 days after initial presentation. Spent time in rehab after admission  C-spine MRI shows multilevel spinal stenosis. Had follow-up by [name] on 4/13.    On last clinic visit positive orthostatics, was having diarrhea spells which he attributed to his cancer medication Isosorbide  was held at that time   on venetoclax  pre Dr. B: on hold for the last 3 months because of multiple hospitalizations Restarted mediaction recently  Recent pacer downloads reviewed, no significant arrhythmia  Other past medical history reviewed Prior history of falls On a prior office visit reported having a mechanical fall at home in the barn landing on cement Suffered ear trauma on the right, almost tore his ear off, barely hanging on.   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  Ectatic abdominal  aorta at risk for aneurysm development.    Echocardiogram 02/04/20 EF 55 to 60%, no RWMA, mild LVH, grade 1 diastolic dysfunction, RV normal size and function, mildly elevated PASP with RV systolic pressure 44.3 mmHg, mild MR, mild to moderate aortic valve sclerosis without stenosis.    Chronic leg pain, now on tramadol  BID from cancer medication  Reports long  history of bad muscle pain in thighs, No better with dexamethasone  constant day and night,   fell off a ladder beginning of October 2017 CT [facility]: 04/25/2016 showing large hematoma Right gluteal intramuscular hematoma  measuring 9.6 x 6.8 x 4.9 cm [facility] stopped eliquis , statin (unclear why they stopped his statin)  Working with the [facility] for PTSD, reports feeling well, stable Reports that he is been compliant with his Lasix , taking 80 mg daily which is higher dose than before for the past 5 days, no improvement in his leg swelling   atrial fibrillation/flutter starting September 12 2015, then persistent flutter In the clinic his heart rate was 126 bpm Amiodarone  dosing was increased, amlodipine  was changed to diltiazem  120 mg daily, metoprolol  increased up to 25 mill grams twice a day Converted to normal sinus rhythm at home    tachycardia concerning for atrial fibrillation 05/10/2015. Lasted only several minutes Rate possibly up to 140 bpm.   Previously has been very active, building a barn, tool shed, taking care of animals  some benefits from the [facility] for agent orange exposure and his cancer   Previous catheterization showing occluded LAD in the mid vessel, 60% left main, 99% distal RCA who was sent to [name] in October 2010 for bypass surgery. He received 3 vessel bypass by [name] with a LIMA to the LAD, vein graft to the OM1 and vein graft to the PDA, with subsequent cardiac catheter March 2011 for chest discomfort showing occluded vein grafts x2 with patent LIMA. He has collateral vessels from left to right. Ejection fraction 55% mild anterolateral hypokinesis.  PMH:   has a past medical history of Agent orange exposure, Anxiety, Arthritis, Atrial fibrillation (HCC) (2013), BPH (benign prostatic hyperplasia), C. 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), Complication of anesthesia, COPD (chronic obstructive pulmonary disease) (HCC), Coronary artery disease, GERD (gastroesophageal reflux disease), H/O tobacco use, presenting hazards to health, History of 2019 novel coronavirus disease (COVID-19) (09/04/2021), HOH (hard of hearing) - requires BILATERAL hearing aids, Hyperlipidemia LDL goal <70, Hypertension, Hypokalemia, Ischemic cardiomyopathy, Ischemic cerebrovascular accident (CVA) (HCC) (10/18/2021), Myocardial infarction (HCC) (2010), On amiodarone  therapy, On apixaban  therapy, OSA on CPAP, 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  PSH:    Past Surgical History:  Procedure Laterality Date   ABDOMINAL HERNIA REPAIR     APPENDECTOMY  06/21/1985   CORONARY ARTERY BYPASS GRAFT N/A 05/09/2009   EP IMPLANTABLE DEVICE N/A 03/03/2016   Procedure: Pacemaker Implant;  Surgeon: Elspeth JAYSON Sage, MD;  Location: [facility] INVASIVE CV LAB;  Service: Cardiovascular;  Laterality: N/A;   FOREIGN BODY REMOVAL  1968   shrapnel in my tailbone   INGUINAL HERNIA REPAIR Right    JOINT REPLACEMENT Right 2018   LAPAROSCOPIC CHOLECYSTECTOMY  LEFT HEART CATH AND CORONARY ANGIOGRAPHY Left 05/08/2009   Procedure: LEFT HEART CATH AND CORONARY ANGIOGRAPHY; Location: [facility]: Surgeon: Darron Grass, MD   LEFT HEART CATH AND CORS/GRAFTS ANGIOGRAPHY Left 10/17/2009   Procedure: LEFT HEART CATH AND CORS/GRAFTS ANGIOGRAPHY; Location: [facility]; Surgeon: Darron Grass, MD   RIGHT/LEFT HEART CATH AND CORONARY/GRAFT ANGIOGRAPHY N/A 02/04/2021   Procedure: RIGHT/LEFT HEART CATH AND CORONARY/GRAFT  ANGIOGRAPHY;  Surgeon: Mady Bruckner, MD;  Location: [facility] INVASIVE CV LAB;  Service: Cardiovascular;  Laterality: N/A;   TONSILLECTOMY AND ADENOIDECTOMY  1956   TOTAL HIP ARTHROPLASTY Right 10/22/2016   Procedure: TOTAL HIP ARTHROPLASTY;  Surgeon: Lynwood SHAUNNA Hue, MD;  Location: [facility] ORS;  Service: Orthopedics;  Laterality: Right;   TOTAL HIP ARTHROPLASTY Left 11/04/2017   Procedure: TOTAL HIP ARTHROPLASTY;  Surgeon: Hue Lynwood SHAUNNA, MD;  Location: [facility] ORS;  Service: Orthopedics;  Laterality: Left;    Current Outpatient Medications  Medication Sig Dispense Refill   acalabrutinib  maleate (CALQUENCE ) 100 MG tablet Take 1 tablet (100 mg total) by mouth 2 (two) times daily. 60 tablet 2   acetaminophen  (TYLENOL ) 325 MG tablet Take 1-2 tablets (325-650 mg total) by mouth every 4 (four) hours as needed for mild pain.     albuterol  (VENTOLIN  HFA) 108 (90 Base) MCG/ACT inhaler Inhale 2 puffs into the lungs every 6 (six) hours as needed for wheezing or shortness of breath. 1 each 0   amiodarone  (PACERONE ) 200 MG tablet TAKE 1/2 TABLET BY MOUTH TWICE DAILY FOR 5 DAYS A WEEK 75 tablet 3   apixaban  (ELIQUIS ) 5 MG TABS tablet Take 1 tablet (5 mg total) by mouth 2 (two) times daily. 90 tablet 3   Apple Cider Vinegar 500 MG TABS Take 1 tablet by mouth daily.     azelastine  (ASTELIN ) 0.1 % nasal spray Place 2 sprays into both nostrils 2 (two) times daily. Use in each nostril as directed 30 mL 1   cetirizine (ZYRTEC) 10 MG tablet Take 10 mg by mouth daily as needed for allergies.      Cholecalciferol (VITAMIN D3) 250 MCG (10000 UT) capsule Take 10,000 Units by mouth daily.     Coenzyme Q10 (COQ10) 200 MG CAPS Take 200 mg by mouth daily.     cyanocobalamin  (VITAMIN B12) 1000 MCG tablet Take 1,000 mcg by mouth daily.     ezetimibe  (ZETIA ) 10 MG tablet Take 1 tablet (10 mg total) by mouth daily. (Patient taking differently: Take 10 mg by mouth every morning.) 90 tablet 3   ferrous sulfate  325 (65 FE) MG EC tablet  Take 325 mg by mouth daily.     fluticasone -salmeterol (WIXELA INHUB) 100-50 MCG/ACT AEPB Inhale 2 puffs into the lungs 2 (two) times daily.     ipratropium-albuterol  (DUONEB) 0.5-2.5 (3) MG/3ML SOLN Take 3 mLs by nebulization every 6 (six) hours as needed (shorntess of breath). 360 mL 0   mirtazapine  (REMERON ) 15 MG tablet Take 15 mg by mouth at bedtime as needed (for panic associated with PTSD).     mometasone -formoterol  (DULERA ) 200-5 MCG/ACT AERO Inhale 2 puffs into the lungs 2 (two) times daily.     Multiple Vitamin (MULTIVITAMIN WITH MINERALS) TABS tablet Take 1 tablet by mouth daily.     pantoprazole  (PROTONIX ) 40 MG tablet TAKE 1 TABLET(40 MG) BY MOUTH DAILY 90 tablet 3   REPATHA  SURECLICK 140 MG/ML SOAJ ADMINISTER 1 ML UNDER THE SKIN EVERY 14 DAYS 2 mL 11   tamsulosin  (FLOMAX ) 0.4 MG CAPS capsule TAKE 1 CAPSULE(0.4 MG) BY  MOUTH DAILY 30 capsule 3   Tiotropium Bromide  Monohydrate 2.5 MCG/ACT AERS Inhale 2 puffs into the lungs at bedtime. Spiriva      tiZANidine  (ZANAFLEX ) 4 MG capsule Take 1 capsule (4 mg total) by mouth 3 (three) times daily. This can make you sleepy. 60 capsule 0   traMADol  (ULTRAM ) 50 MG tablet Take 50 mg by mouth 2 (two) times daily.     doxycycline  (VIBRAMYCIN ) 100 MG capsule Take 1 capsule (100 mg total) by mouth 2 (two) times daily. (Patient not taking: Reported on 02/08/2024) 20 capsule 0   No current facility-administered medications for this visit.   Facility-Administered Medications Ordered in Other Visits  Medication Dose Route Frequency Provider Last Rate Last Admin   potassium chloride  10 mEq in 100 mL IVPB  10 mEq Intravenous Once Borders, Joshua R, NP         Allergies:   Atorvastatin  and Augmentin  [amoxicillin -pot clavulanate]   Social History:  The patient  reports that he quit smoking about 17 years ago. His smoking use included cigarettes. He started smoking about 57 years ago. He has a 40 Doyle-year smoking history. He has never used smokeless  tobacco. He reports that he does not currently use alcohol. He reports that he does not use drugs.   Family History:   family history includes Coronary artery disease in an other family member; Heart attack in his mother; Heart disease in his mother.    Review of Systems: Review of Systems  Constitutional: Negative.   HENT: Negative.    Respiratory: Negative.    Cardiovascular: Negative.   Gastrointestinal: Negative.   Musculoskeletal: Negative.   Neurological: Negative.   Psychiatric/Behavioral: Negative.    All other systems reviewed and are negative.  PHYSICAL EXAM: VS:  BP (!) 100/50 (BP Location: Left Arm, Patient Position: Sitting, Cuff Size: Normal)   Pulse 67   Ht 5' 9 (1.753 m)   Wt 196 lb 6 oz (89.1 kg)   SpO2 97%   BMI 29.00 kg/m  , BMI Body mass index is 29 kg/m. Constitutional:  oriented to person, place, and time. No distress.  HENT:  Head: Grossly normal Eyes:  no discharge. No scleral icterus.  Neck: No JVD, no carotid bruits  Cardiovascular: Regular rate and rhythm, no murmurs appreciated Pulmonary/Chest: Clear to auscultation bilaterally, no wheezes or rales Abdominal: Soft.  no distension.  no tenderness.  Musculoskeletal: Normal range of motion Neurological:  normal muscle tone. Coordination normal. No atrophy Skin: Skin warm and dry Psychiatric: normal affect, pleasant  Recent Labs: 04/03/2023: Magnesium  1.8 10/15/2023: TSH 1.550 01/15/2024: ALT 15; BUN 20; Creatinine 1.06; Hemoglobin 11.3; Platelet Count 147; Potassium 3.9; Sodium 140    Lipid Panel Lab Results  Component Value Date   CHOL 94 01/07/2024   HDL 47.10 01/07/2024   LDLCALC 25 01/07/2024   TRIG 107.0 01/07/2024    Wt Readings from Last 3 Encounters:  02/08/24 196 lb 6 oz (89.1 kg)  01/15/24 203 lb 6.4 oz (92.3 kg)  01/09/24 205 lb 4 oz (93.1 kg)    ASSESSMENT AND PLAN: Atrial fibrillation, unspecified type (HCC)  Maintaining normal sinus rhythm on clinic exam Continue  amiodarone  100 twice daily, metoprolol  on hold though could be reinitiated in the setting of tachyarrhythmia On eliquis , no bleeding Overall rhythm stable  HYPERCHOLESTEROLEMIA Currently on zetia  , praluent  Weight trending downward History of statin myalgias Cholesterol at goal  Essential hypertension Blood pressure low, asymptomatic Recommend he stay hydrated Currently not on any medications  that would drop his blood pressure apart from Flomax   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 with angina pectoris with documented spasm (HCC) Currently with no symptoms of angina. No further workup at this time. Continue current medication regimen.  Chronic diastolic CHF (congestive heart failure) (HCC) Off lasix  40 daily, appears euvolemic  Centrilobular emphysema (HCC) Stable, SOB  Sinus pause  pacemaker in place Pacer downloads reviewed, no significant arrhythmia  Leg pain ABIs normal Stable leg strength   Orders Placed This Encounter  Procedures   EKG 12-Lead    Signed, Velinda Lunger, M.D., Ph.D. 02/08/2024  [facility] [place], [place] [phone]

## 2024-02-07 ENCOUNTER — Ambulatory Visit: Payer: Self-pay | Admitting: Cardiology

## 2024-02-08 ENCOUNTER — Encounter: Payer: Self-pay | Admitting: Cardiovascular Disease

## 2024-02-08 ENCOUNTER — Ambulatory Visit
Admission: EM | Admit: 2024-02-08 | Discharge: 2024-02-08 | Disposition: A | Discharge status: Home / Self Care | Attending: Family Medicine | Admitting: Family Medicine

## 2024-02-08 ENCOUNTER — Ambulatory Visit: Attending: Cardiovascular Disease | Admitting: Cardiovascular Disease

## 2024-02-08 VITALS — BP 100/50 | HR 67 | Ht 69.0 in | Wt 196.4 lb

## 2024-02-08 DIAGNOSIS — I495 Sick sinus syndrome: Secondary | ICD-10-CM | POA: Diagnosis not present

## 2024-02-08 DIAGNOSIS — J441 Chronic obstructive pulmonary disease with (acute) exacerbation: Secondary | ICD-10-CM | POA: Diagnosis present

## 2024-02-08 DIAGNOSIS — R197 Diarrhea, unspecified: Secondary | ICD-10-CM | POA: Insufficient documentation

## 2024-02-08 DIAGNOSIS — Z951 Presence of aortocoronary bypass graft: Secondary | ICD-10-CM

## 2024-02-08 DIAGNOSIS — I5032 Chronic diastolic (congestive) heart failure: Secondary | ICD-10-CM

## 2024-02-08 DIAGNOSIS — Z950 Presence of cardiac pacemaker: Secondary | ICD-10-CM

## 2024-02-08 DIAGNOSIS — C9110 Chronic lymphocytic leukemia of B-cell type not having achieved remission: Secondary | ICD-10-CM | POA: Diagnosis not present

## 2024-02-08 DIAGNOSIS — R0609 Other forms of dyspnea: Secondary | ICD-10-CM

## 2024-02-08 DIAGNOSIS — I480 Paroxysmal atrial fibrillation: Secondary | ICD-10-CM | POA: Diagnosis not present

## 2024-02-08 DIAGNOSIS — I2510 Atherosclerotic heart disease of native coronary artery without angina pectoris: Secondary | ICD-10-CM | POA: Diagnosis not present

## 2024-02-08 DIAGNOSIS — G4733 Obstructive sleep apnea (adult) (pediatric): Secondary | ICD-10-CM

## 2024-02-08 DIAGNOSIS — E7800 Pure hypercholesterolemia, unspecified: Secondary | ICD-10-CM

## 2024-02-08 LAB — COMPREHENSIVE METABOLIC PANEL WITH GFR
ALT: 24 U/L (ref 0–44)
AST: 21 U/L (ref 15–41)
Albumin: 3.6 g/dL (ref 3.5–5.0)
Alkaline Phosphatase: 129 U/L — ABNORMAL HIGH (ref 38–126)
Anion gap: 9 (ref 5–15)
BUN: 25 mg/dL — ABNORMAL HIGH (ref 8–23)
CO2: 23 mmol/L (ref 22–32)
Calcium: 8.6 mg/dL — ABNORMAL LOW (ref 8.9–10.3)
Chloride: 105 mmol/L (ref 98–111)
Creatinine, Ser: 1.01 mg/dL (ref 0.61–1.24)
GFR, Estimated: >60 mL/min (ref >60)
Glucose, Bld: 112 mg/dL — ABNORMAL HIGH (ref 70–99)
Potassium: 4 mmol/L (ref 3.5–5.1)
Sodium: 137 mmol/L (ref 135–145)
Total Bilirubin: 0.9 mg/dL (ref 0.0–1.2)
Total Protein: 7.5 g/dL (ref 6.5–8.1)

## 2024-02-08 LAB — CBC WITH DIFFERENTIAL/PLATELET
Abs Immature Granulocytes: 0.05 K/uL (ref 0.00–0.07)
Basophils Absolute: 0 K/uL (ref 0.0–0.1)
Basophils Relative: 0 %
Eosinophils Absolute: 0.1 K/uL (ref 0.0–0.5)
Eosinophils Relative: 2 %
HCT: 32.5 % — ABNORMAL LOW (ref 39.0–52.0)
Hemoglobin: 10.5 g/dL — ABNORMAL LOW (ref 13.0–17.0)
Immature Granulocytes: 1 %
Lymphocytes Relative: 39 %
Lymphs Abs: 2.9 K/uL (ref 0.7–4.0)
MCH: 32.1 pg (ref 26.0–34.0)
MCHC: 32.3 g/dL (ref 30.0–36.0)
MCV: 99.4 fL (ref 80.0–100.0)
Monocytes Absolute: 0.7 K/uL (ref 0.1–1.0)
Monocytes Relative: 10 %
Neutro Abs: 3.7 K/uL (ref 1.7–7.7)
Neutrophils Relative %: 48 %
Platelets: 207 K/uL (ref 150–400)
RBC: 3.27 MIL/uL — ABNORMAL LOW (ref 4.22–5.81)
RDW: 13.5 % (ref 11.5–15.5)
WBC: 7.6 K/uL (ref 4.0–10.5)
nRBC: 0 % (ref 0.0–0.2)

## 2024-02-08 MED ORDER — EZETIMIBE 10 MG PO TABS: 10.0000 mg | ORAL_TABLET | Freq: Every day | ORAL | 3 refills | Status: AC

## 2024-02-08 MED ORDER — PREDNISONE 50 MG PO TABS: 50.0000 mg | ORAL_TABLET | Freq: Every day | ORAL | 0 refills | Status: AC

## 2024-02-08 NOTE — ED Provider Notes (Signed)
 [facility]    CSN: [id number] Arrival date & time: 02/08/24  1506      History   Chief Complaint Chief Complaint  Patient presents with   Diarrhea    HPI JOHNY PITSTICK is a 78 y.o. male.   HPI  History obtained from the patient and his wife  Beckham presents for diarrhea, nausea and decreased appetite for 3-4 days.  Has explosive diarrhea.  He sits down on the toilet and feels like he has to fart and then it explosive. He messed up his shorts this morning after leaving his doctors office. He went to his daughters house to change clothes. Wife notes whenever he goes out in the sun for 5 minutes, he gets sick. The is mostly due to being on his feet.     Has productive cough for several days. Has COPD. Last night, he got to coughing and couldn't breathe.  He was wearing his CPAP.  He moved to the recliner.    He doesn't feel like eating. 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       Past Medical History:  Diagnosis Date   Agent orange exposure    Anxiety    Arthritis    Atrial fibrillation (HCC) 2013   a.) Dx'd in 2013; recurrence 02/2014; b.) CHA2DS2-VASc = 7 (age x2, HFimpEF, HTN, CVA x 2, previous MI); c.) cardiac rate/rhythm maintained on oral amiodarone ; chronically anticoagulated using apixaban    BPH (benign prostatic hyperplasia)    C. 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)    a.) MV 08/23/2015: EF 50%; b.) TTE 08/23/2015: EF 55-60%, G1DD; c.) TTE 11/02/2015: EF 55-60%, mild LA dil; d.) TTE 02/04/2020: EF 55-60%, mild LVH, G1DD, PASP 44.3, mild-mod AoV sclerosis; e.) MV 12/25/2020: EF 45-54%; f.)  R/LHC 02/04/2021: mRA 13, mPA 27, mPCWP 22, LVEDP 23, Ao sat 99, PA sat 70, CO 6.5, CI 3.0; g.) TTE  02/19/2021: EF 55-60%, G1DD, mildly red RVSF, triv MR, mild-mod AoV scler   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 EF 55%, mild antlat HK;  c.) s/p R/LHC 02/04/2021: LM mild diff dzs, LAD 100p, LCX nl, RCA 50p, 95d, 99 side branch, RPAV fills via LCx. SVG->RPDA 100, SVG->OM1 100, LIMA->LAD patent. EF 35-45%.   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    Hyperlipidemia LDL goal <70    Hypertension    Hypokalemia    Ischemic cardiomyopathy    a.) MV 08/23/2015: EF 50%; b.) TTE 08/23/2015: EF 55-60%; c.) TTE 11/02/2015: EF 55-60%; d.) TTE 02/04/2020: EF 55-60%; e.) MV 12/25/2020: EF 45-54%; f.) R/LHC  02/04/2021: mRA 13, mPA 27, mPCWP 22, LVEDP 23, Ao sat 99, PA sat 70, CO 6.5, CI 3.0; g.) TTE 02/19/2021: EF 55-60%   Ischemic cerebrovascular accident (CVA) (HCC) 10/18/2021   Myocardial infarction (HCC) 2010   On amiodarone  therapy    On apixaban  therapy    OSA on CPAP    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   TIA (transient ischemic attack) 11/02/2015    Patient Active Problem List   Diagnosis Date Noted   Cellulitis of leg, right 01/13/2024   Other abnormalities of gait and mobility 01/07/2024   Problem related to unspecified psychosocial circumstances 01/07/2024   Pneumonia due to infectious organism 10/13/2023   Head injury 07/10/2023   Spondylosis, cervical, with myelopathy 04/21/2023   Cervical myelopathy (HCC) 04/21/2023   Class 1 obesity due to excess calories with body mass index (BMI) of 30.0 to 30.9 in adult 04/01/2023   Hypotension 03/31/2023   Campylobacter gastroenteritis 03/31/2023   Respiratory tract infection due to COVID-19 virus 03/10/2023   Spinal stenosis 01/09/2023   Dizziness 01/09/2023   BPH (benign prostatic hyperplasia) 01/09/2023   Orthostatic hypertension 12/11/2022   Fatigue 12/08/2022   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   Sensorineural hearing loss, 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   (HFpEF) heart failure with preserved ejection fraction (HCC) 06/30/2022   Abnormal INR 06/30/2022   Anxiety 06/30/2022   GERD (gastroesophageal reflux disease) 06/30/2022   History of CVA (cerebrovascular accident) 06/30/2022   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/30/2022   Anemia 06/02/2022   Dysphagia 01/03/2022   Lymphadenopathy 01/03/2022   Chronic neck pain 01/03/2022   Hearing loss 12/23/2021   Personal history of tobacco use, presenting hazards to health 12/23/2021   Polyp of colon 12/23/2021   Neck pain 12/23/2021   Neck nodule 11/11/2021   CVA (cerebral vascular accident) (HCC) 10/18/2021   AKI (acute kidney injury) (HCC) 09/13/2021   Weakness 09/04/2021   COVID-19 virus infection 09/04/2021   Accelerating angina (HCC) 02/04/2021   Abnormal stress  test    Cardiomyopathy (HCC)    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   CAD (coronary artery disease)    Depression    Arthritis 09/16/2019   Dehydration 03/14/2019   Sick sinus syndrome (HCC) 07/08/2016   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   Thrombocytopenia (HCC) 02/29/2016   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    PAD (peripheral artery disease) (HCC)    OSA on CPAP    Chronic diastolic CHF (congestive heart failure) (HCC) 09/20/2015   CLL (chronic lymphocytic leukemia) (HCC)    PTSD (post-traumatic stress disorder)    Osteoarthritis of both knees 07/05/2015   COPD (chronic obstructive pulmonary disease) (HCC) 01/01/2012   Shortness of breath 10/09/2011   Paroxysmal atrial fibrillation (HCC) 10/09/2011   Hyperlipidemia 11/28/2009   Essential hypertension 11/28/2009  Past Surgical History:  Procedure Laterality Date   ABDOMINAL HERNIA REPAIR     APPENDECTOMY  06/21/1985   CORONARY ARTERY BYPASS GRAFT N/A 05/09/2009   EP IMPLANTABLE DEVICE N/A 03/03/2016   Procedure: Pacemaker Implant;  Surgeon: Elspeth JAYSON Sage, MD;  Location[facility] INVASIVE CV LAB;  Service: Cardiovascular;  Laterality: N/A;   FOREIGN BODY REMOVAL  1968   shrapnel in my tailbone   INGUINAL HERNIA REPAIR Right    JOINT REPLACEMENT Right 2018   LAPAROSCOPIC CHOLECYSTECTOMY     LEFT HEART CATH AND CORONARY ANGIOGRAPHY Left 05/08/2009   Procedure: LEFT HEART CATH AND CORONARY ANGIOGRAPHY; Location: [facility]: Surgeon: Darron Grass, MD   LEFT HEART CATH AND CORS/GRAFTS ANGIOGRAPHY Left 10/17/2009   Procedure: LEFT HEART CATH AND CORS/GRAFTS ANGIOGRAPHY; Location: [facility]; Surgeon: Darron Grass, MD   RIGHT/LEFT HEART CATH AND CORONARY/GRAFT ANGIOGRAPHY N/A 02/04/2021   Procedure: RIGHT/LEFT HEART CATH AND CORONARY/GRAFT ANGIOGRAPHY;  Surgeon: Mady Bruckner, MD;  Location: [facility] INVASIVE CV LAB;  Service: Cardiovascular;  Laterality: N/A;   TONSILLECTOMY AND  ADENOIDECTOMY  1956   TOTAL HIP ARTHROPLASTY Right 10/22/2016   Procedure: TOTAL HIP ARTHROPLASTY;  Surgeon: Lynwood SHAUNNA Hue, MD;  Location: [facility] ORS;  Service: Orthopedics;  Laterality: Right;   TOTAL HIP ARTHROPLASTY Left 11/04/2017   Procedure: TOTAL HIP ARTHROPLASTY;  Surgeon: Hue Lynwood SHAUNNA, MD;  Location: [facility] ORS;  Service: Orthopedics;  Laterality: Left;       Home Medications    Prior to Admission medications   Medication Sig Start Date End Date Taking? Authorizing Provider  acalabrutinib  maleate (CALQUENCE ) 100 MG tablet Take 1 tablet (100 mg total) by mouth 2 (two) times daily. 01/25/24  Yes Brahmanday, Govinda R, MD  acetaminophen  (TYLENOL ) 325 MG tablet Take 1-2 tablets (325-650 mg total) by mouth every 4 (four) hours as needed for mild pain. 11/01/21  Yes Setzer, Sandra J, PA-C  albuterol  (VENTOLIN  HFA) 108 (90 Base) MCG/ACT inhaler Inhale 2 puffs into the lungs every 6 (six) hours as needed for wheezing or shortness of breath. 11/01/21  Yes Setzer, Sandra J, PA-C  amiodarone  (PACERONE ) 200 MG tablet TAKE 1/2 TABLET BY MOUTH TWICE DAILY FOR 5 DAYS A WEEK 12/18/23  Yes Sage Elspeth JAYSON, MD  apixaban  (ELIQUIS ) 5 MG TABS tablet Take 1 tablet (5 mg total) by mouth 2 (two) times daily. 10/28/23  Yes Hammock, Sheri, NP  Apple Cider Vinegar 500 MG TABS Take 1 tablet by mouth daily.   Yes [provider]  azelastine  (ASTELIN ) 0.1 % nasal spray Place 2 sprays into both nostrils 2 (two) times daily. Use in each nostril as directed 03/10/23  Yes Gretel App, NP  cetirizine (ZYRTEC) 10 MG tablet Take 10 mg by mouth daily as needed for allergies.    Yes [provider]  Cholecalciferol (VITAMIN D3) 250 MCG (10000 UT) capsule Take 10,000 Units by mouth daily.   Yes [provider]  Coenzyme Q10 (COQ10) 200 MG CAPS Take 200 mg by mouth daily.   Yes [provider]  cyanocobalamin  (VITAMIN B12) 1000 MCG tablet Take 1,000 mcg by mouth daily.   Yes [provider]  doxycycline  (VIBRAMYCIN ) 100 MG capsule Take 1 capsule (100 mg total) by mouth 2 (two) times daily. 01/06/24  Yes Ashanti Littles, DO  ezetimibe  (ZETIA ) 10 MG tablet Take 1 tablet (10 mg total) by mouth daily. 02/08/24 05/08/24 Yes Gollan, Timothy J, MD  ferrous sulfate  325 (65 FE) MG EC tablet Take 325 mg by mouth daily.  Yes [provider]  fluticasone -salmeterol (WIXELA INHUB) 100-50 MCG/ACT AEPB Inhale 2 puffs into the lungs 2 (two) times daily.   Yes [provider]  ipratropium-albuterol  (DUONEB) 0.5-2.5 (3) MG/3ML SOLN Take 3 mLs by nebulization every 6 (six) hours as needed (shorntess of breath). 09/24/23  Yes Arvis Jolan NOVAK, PA-C  mirtazapine  (REMERON ) 15 MG tablet Take 15 mg by mouth at bedtime as needed (for panic associated with PTSD).   Yes [provider]  mometasone -formoterol  (DULERA ) 200-5 MCG/ACT AERO Inhale 2 puffs into the lungs 2 (two) times daily.   Yes [provider]  Multiple Vitamin (MULTIVITAMIN WITH MINERALS) TABS tablet Take 1 tablet by mouth daily.   Yes [provider]  pantoprazole  (PROTONIX ) 40 MG tablet TAKE 1 TABLET(40 MG) BY MOUTH DAILY 01/15/24  Yes Gretel App, NP  predniSONE  (DELTASONE ) 50 MG tablet Take 1 tablet (50 mg total) by mouth daily for 5 days. 02/08/24 02/13/24 Yes Arda Daggs, DO  REPATHA  SURECLICK 140 MG/ML SOAJ ADMINISTER 1 ML UNDER THE SKIN EVERY 14 DAYS 10/19/23  Yes Gollan, Timothy J, MD  tamsulosin  (FLOMAX ) 0.4 MG CAPS capsule TAKE 1 CAPSULE(0.4 MG) BY MOUTH DAILY 05/12/23  Yes Maribeth Camellia MATSU, MD  Tiotropium Bromide  Monohydrate 2.5 MCG/ACT AERS Inhale 2 puffs into the lungs at bedtime. Spiriva  08/07/20  Yes [provider]  tiZANidine  (ZANAFLEX ) 4 MG capsule Take 1 capsule (4 mg total) by mouth 3 (three) times daily. This can make you sleepy. 04/24/23  Yes Hilma Hastings, PA-C  traMADol  (ULTRAM ) 50 MG tablet Take 50 mg by mouth 2 (two) times daily.   Yes [provider]     Family History Family History  Problem Relation Age of Onset   Heart disease Mother    Heart attack Mother    Coronary artery disease Other        family history    Social History Social History   Tobacco Use   Smoking status: Former    Current packs/day: 0.00    Average packs/day: 1 pack/day for 40.0 years (40.0 ttl pk-yrs)    Types: Cigarettes    Start date: 07/21/1966    Quit date: 07/21/2006    Years since quitting: 17.5   Smokeless tobacco: Never  Vaping Use   Vaping status: Former  Substance Use Topics   Alcohol use: Not Currently   Drug use: No     Allergies   Atorvastatin  and Augmentin  [amoxicillin -pot clavulanate]   Review of Systems Review of Systems: negative unless otherwise stated in HPI.      Physical Exam Triage Vital Signs ED Triage Vitals [02/08/24 1609]  Encounter Vitals Group     BP 110/68     Girls Systolic BP Percentile      Girls Diastolic BP Percentile      Boys Systolic BP Percentile      Boys Diastolic BP Percentile      Pulse Rate 62     Resp 19     Temp 97.9 F (36.6 C)     Temp Source Oral     SpO2 95 %     Weight      Height      Head Circumference      Peak Flow      Pain Score 8     Pain Loc      Pain Education      Exclude from Growth Chart    No data found.  Updated Vital Signs BP 110/68 (BP  Location: Right Arm)   Pulse 62   Temp 97.9 F (36.6 C) (Oral)   Resp 19   SpO2 95%   Visual Acuity Right Eye Distance:   Left Eye Distance:   Bilateral Distance:    Right Eye Near:   Left Eye Near:    Bilateral Near:     Physical Exam GEN:     alert, non-toxic appearing male in no distress    HENT:  mucus membranes moist, oropharyngeal without lesions or erythema EYES:  no scleral injection, icterus or discharge RESP:  no increased work of breathing, rhonchi bilaterally,  CVS:   regular rate and rhythm ABD:   Soft, nontender, mildly distended, no guarding, no rebound, active bowel sounds throughout, negative  McBurney's, negative Murphy's NEURO:  alert, oriented, speech normal, CN 2-12 grossly intact, no facial droop,  sensation grossly intact, strength 5/5 bilateral UE and LE, normal coordination Skin:   warm and dry    UC Treatments / Results  Labs (all labs ordered are listed, but only abnormal results are displayed) Labs Reviewed  CBC WITH DIFFERENTIAL/PLATELET - Abnormal; Notable for the following components:      Result Value   RBC 3.27 (*)    Hemoglobin 10.5 (*)    HCT 32.5 (*)    All other components within normal limits  COMPREHENSIVE METABOLIC PANEL WITH GFR - Abnormal; Notable for the following components:   Glucose, Bld 112 (*)    BUN 25 (*)    Calcium  8.6 (*)    Alkaline Phosphatase 129 (*)    All other components within normal limits  GASTROINTESTINAL PANEL BY PCR, STOOL (REPLACES STOOL CULTURE)    EKG   Radiology No results found.  Procedures Procedures (including critical care time)  Medications Ordered in UC Medications - No data to display  Initial Impression / Assessment and Plan / UC Course  I have reviewed the triage vital signs and the nursing notes.  Pertinent labs & imaging results that were available during my care of the patient were reviewed by me and considered in my medical decision making (see chart for details).       Pt is a 78 y.o. male who presents for 1 week of diarrhea and days of respiratory symptoms. Nichole is afebrile here without recent antipyretics. Satting well on room air. Overall pt is non-toxic appearing, well hydrated, without respiratory distress. Pulmonary exam is remarkable for cough and rhonchi.  COVID and influenza panel deferred due to duration of symptoms. History consistent with COPD exacerbation secondary to viral respiratory illness. Discussed symptomatic treatment. Treat with prednisone  and hold off on antibiotics in the event he has C. diff.  Typical duration of symptoms discussed.   Pt with days of explosive  non-bloody malodorous diarrhea.  Obtain C. Diff screen as he has multiple rounds of antibiotics recently.  Obtain GI pathogen panel as he has history of Campylobacter infection.  CBC without leukocytosis but he worsening of his normocytic anemia. CMP grossly unremarkable.  Handout for food choices to help with diarrhea provided.  Stressed the importance of hydration.   Return and ED precautions given and voiced understanding. Discussed MDM, treatment plan and plan for follow-up with patient and his wife who agrees with plan.     Final Clinical Impressions(s) / UC Diagnoses   Final diagnoses:  Diarrhea of presumed infectious origin  COPD exacerbation (HCC)     Discharge Instructions      Your blood work didn't show any cause of your  symptoms. Your are anemic. Follow up with your primary care doctor to have this level rechecked.    Take your steroids to help the inflammation in your lungs. If your stool samples are normal, I will also send in some antibiotics.    Return your stool samples to the urgent care to further evaluation of the cause of your diarrhea.  Be sure to stay hydrated.  See handout on food choices to help relieve diarrhea.      ED Prescriptions     Medication Sig Dispense Auth. Provider   predniSONE  (DELTASONE ) 50 MG tablet Take 1 tablet (50 mg total) by mouth daily for 5 days. 5 tablet Kriste Berth, DO      PDMP not reviewed this encounter.   Dermot Gremillion, DO 02/10/24 1011

## 2024-02-08 NOTE — Discharge Instructions (Addendum)
 Your blood work didn't show any cause of your symptoms. Your are anemic. Follow up with your primary care doctor to have this level rechecked.    Take your steroids to help the inflammation in your lungs. If your stool samples are normal, I will also send in some antibiotics.    Return your stool samples to the urgent care to further evaluation of the cause of your diarrhea.  Be sure to stay hydrated.  See handout on food choices to help relieve diarrhea.

## 2024-02-08 NOTE — Patient Instructions (Signed)
 Medication Instructions:  No changes  If you need a refill on your cardiac medications before your next appointment, please call your pharmacy.   Lab work: No new labs needed  Testing/Procedures: No new testing needed  Follow-Up: At [facility], you and your health needs are our priority.  As part of our continuing mission to provide you with exceptional heart care, we have created designated Provider Care Teams.  These Care Teams include your primary Cardiologist (physician) and Advanced Practice Providers (APPs -  Physician Assistants and Nurse Practitioners) who all work together to provide you with the care you need, when you need it.  You will need a follow up appointment in 12 months  Providers on your designated Care Team:   Nicolasa Ducking, NP Eula Listen, PA-C Cadence Fransico Michael, [place]  COVID-19 Vaccine Information can be found at: [email or web address] For questions related to vaccine distribution or appointments, please email vaccine@ .com or call [phone].

## 2024-02-08 NOTE — ED Triage Notes (Addendum)
 Sx x 1 week  Patient states that he can't go outside without getting sick. Nausea, shaking,fatigue. Diarrhea started today.   These sx dont happen if he's inside.   Productive cough several days

## 2024-02-09 ENCOUNTER — Other Ambulatory Visit
Admission: RE | Admit: 2024-02-09 | Discharge: 2024-02-09 | Disposition: A | Discharge status: Home / Self Care | Source: Ambulatory Visit | Attending: Family Medicine | Admitting: Family Medicine

## 2024-02-09 DIAGNOSIS — A09 Infectious gastroenteritis and colitis, unspecified: Secondary | ICD-10-CM | POA: Insufficient documentation

## 2024-02-09 DIAGNOSIS — R197 Diarrhea, unspecified: Secondary | ICD-10-CM | POA: Diagnosis present

## 2024-02-09 DIAGNOSIS — J449 Chronic obstructive pulmonary disease, unspecified: Secondary | ICD-10-CM | POA: Diagnosis present

## 2024-02-10 ENCOUNTER — Ambulatory Visit: Payer: Self-pay | Admitting: Family Medicine

## 2024-02-10 DIAGNOSIS — J441 Chronic obstructive pulmonary disease with (acute) exacerbation: Secondary | ICD-10-CM

## 2024-02-10 LAB — GASTROINTESTINAL PANEL BY PCR, STOOL (REPLACES STOOL CULTURE)

## 2024-02-10 LAB — C DIFFICILE QUICK SCREEN W PCR REFLEX
C Diff antigen: NEGATIVE
C Diff interpretation: NOT DETECTED
C Diff toxin: NEGATIVE

## 2024-02-10 MED ORDER — DOXYCYCLINE HYCLATE 100 MG PO CAPS: 100.0000 mg | ORAL_CAPSULE | Freq: Two times a day (BID) | ORAL | 0 refills | Status: DC

## 2024-02-17 ENCOUNTER — Other Ambulatory Visit: Payer: Self-pay

## 2024-02-17 NOTE — Progress Notes (Signed)
 Specialty Pharmacy Refill Coordination Note  [name] is a 78 y.o. male contacted today regarding refills of specialty medication(s) Acalabrutinib  Maleate (Calquence )   Patient requested Delivery   Delivery date: 02/19/24   Verified address: [address]  [place] [address]   Medication will be filled on 02/18/24.

## 2024-02-18 ENCOUNTER — Other Ambulatory Visit (HOSPITAL_COMMUNITY): Payer: Self-pay

## 2024-02-18 ENCOUNTER — Other Ambulatory Visit: Payer: Self-pay

## 2024-02-18 NOTE — Progress Notes (Signed)
 Clinical Intervention Note  Clinical Intervention Notes: Patient reported recently starting doxycycline ; no drug-drug interactions were identified. He also reported feeling exhausted when outdoors. I contacted the patient and provided counseling on the importance of staying well-hydrated, avoiding excessive sun and heat exposure, and limiting physical activity in hot conditions. The patient mentioned having COPD and acknowledged that humidity may be contributing to his symptoms. He was encouraged to discuss these concerns with his provider at the next appointment and expressed understanding of the advice given.   Clinical Intervention Outcomes: Improved therapy effectiveness; Prevention of an adverse drug event   [name] Blair Karel Boggan

## 2024-03-11 ENCOUNTER — Other Ambulatory Visit: Payer: Self-pay

## 2024-03-11 NOTE — Progress Notes (Signed)
 Specialty Pharmacy Refill Coordination Note  [name] is a 78 y.o. male contacted today regarding refills of specialty medication(s) Acalabrutinib  Maleate (Calquence )   Patient requested Delivery   Delivery date: 03/29/24   Verified address: [address]  [place] [address]   Medication will be filled on 03/28/24.

## 2024-03-16 ENCOUNTER — Inpatient Hospital Stay: Attending: Internal Medicine

## 2024-03-16 ENCOUNTER — Inpatient Hospital Stay: Admitting: Internal Medicine

## 2024-03-16 ENCOUNTER — Encounter: Payer: Self-pay | Admitting: Internal Medicine

## 2024-03-16 VITALS — BP 165/75 | HR 61 | Temp 96.9°F | Resp 18 | Ht 69.0 in | Wt 203.0 lb

## 2024-03-16 DIAGNOSIS — D649 Anemia, unspecified: Secondary | ICD-10-CM | POA: Diagnosis not present

## 2024-03-16 DIAGNOSIS — J449 Chronic obstructive pulmonary disease, unspecified: Secondary | ICD-10-CM | POA: Insufficient documentation

## 2024-03-16 DIAGNOSIS — Z79899 Other long term (current) drug therapy: Secondary | ICD-10-CM | POA: Insufficient documentation

## 2024-03-16 DIAGNOSIS — C9112 Chronic lymphocytic leukemia of B-cell type in relapse: Secondary | ICD-10-CM | POA: Diagnosis present

## 2024-03-16 DIAGNOSIS — I4891 Unspecified atrial fibrillation: Secondary | ICD-10-CM | POA: Diagnosis not present

## 2024-03-16 DIAGNOSIS — Z87891 Personal history of nicotine dependence: Secondary | ICD-10-CM | POA: Insufficient documentation

## 2024-03-16 DIAGNOSIS — Z8673 Personal history of transient ischemic attack (TIA), and cerebral infarction without residual deficits: Secondary | ICD-10-CM | POA: Insufficient documentation

## 2024-03-16 DIAGNOSIS — R5382 Chronic fatigue, unspecified: Secondary | ICD-10-CM | POA: Insufficient documentation

## 2024-03-16 DIAGNOSIS — R233 Spontaneous ecchymoses: Secondary | ICD-10-CM | POA: Insufficient documentation

## 2024-03-16 DIAGNOSIS — Z7901 Long term (current) use of anticoagulants: Secondary | ICD-10-CM | POA: Diagnosis not present

## 2024-03-16 DIAGNOSIS — C9110 Chronic lymphocytic leukemia of B-cell type not having achieved remission: Secondary | ICD-10-CM

## 2024-03-16 LAB — CBC WITH DIFFERENTIAL (CANCER CENTER ONLY)
Abs Immature Granulocytes: 0.03 K/uL (ref 0.00–0.07)
Basophils Absolute: 0 K/uL (ref 0.0–0.1)
Basophils Relative: 1 %
Eosinophils Absolute: 0.1 K/uL (ref 0.0–0.5)
Eosinophils Relative: 2 %
HCT: 36.7 % — ABNORMAL LOW (ref 39.0–52.0)
Hemoglobin: 11.7 g/dL — ABNORMAL LOW (ref 13.0–17.0)
Immature Granulocytes: 1 %
Lymphocytes Relative: 47 %
Lymphs Abs: 3.1 K/uL (ref 0.7–4.0)
MCH: 32.5 pg (ref 26.0–34.0)
MCHC: 31.9 g/dL (ref 30.0–36.0)
MCV: 101.9 fL — ABNORMAL HIGH (ref 80.0–100.0)
Monocytes Absolute: 0.5 K/uL (ref 0.1–1.0)
Monocytes Relative: 8 %
Neutro Abs: 2.7 K/uL (ref 1.7–7.7)
Neutrophils Relative %: 41 %
Platelet Count: 144 K/uL — ABNORMAL LOW (ref 150–400)
RBC: 3.6 MIL/uL — ABNORMAL LOW (ref 4.22–5.81)
RDW: 15.2 % (ref 11.5–15.5)
WBC Count: 6.5 K/uL (ref 4.0–10.5)
nRBC: 0 % (ref 0.0–0.2)

## 2024-03-16 LAB — CMP (CANCER CENTER ONLY)
ALT: 15 U/L (ref 0–44)
AST: 18 U/L (ref 15–41)
Albumin: 3.8 g/dL (ref 3.5–5.0)
Alkaline Phosphatase: 74 U/L (ref 38–126)
Anion gap: 7 (ref 5–15)
BUN: 27 mg/dL — ABNORMAL HIGH (ref 8–23)
CO2: 25 mmol/L (ref 22–32)
Calcium: 9.2 mg/dL (ref 8.9–10.3)
Chloride: 107 mmol/L (ref 98–111)
Creatinine: 1.21 mg/dL (ref 0.61–1.24)
GFR, Estimated: >60 mL/min (ref >60)
Glucose, Bld: 104 mg/dL — ABNORMAL HIGH (ref 70–99)
Potassium: 4.4 mmol/L (ref 3.5–5.1)
Sodium: 139 mmol/L (ref 135–145)
Total Bilirubin: 1.1 mg/dL (ref 0.0–1.2)
Total Protein: 6.6 g/dL (ref 6.5–8.1)

## 2024-03-16 LAB — VITAMIN B12: Vitamin B-12: 1135 pg/mL — ABNORMAL HIGH (ref 180–914)

## 2024-03-16 LAB — LACTATE DEHYDROGENASE: LDH: 143 U/L (ref 98–192)

## 2024-03-16 MED ORDER — TRAMADOL HCL 50 MG PO TABS: 50.0000 mg | ORAL_TABLET | Freq: Two times a day (BID) | ORAL | 0 refills | Status: AC

## 2024-03-16 NOTE — Assessment & Plan Note (Addendum)
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  Patient currently on Acalabrutnib [AUG 16th, 2023]-  Patient admits to compliance; March- April CT CAP:  Decreased size of lymph nodes above and below the diaphragm; No splenomegaly. -Continue Acalbrutinib 100 mg BID.   # Clinically stable; no evidence of any progression - stable- Will repeat scan in next 6 weeks-  will order  at next visit  # Neck pain/Joint pains/ Muscle pain- s/p evaluation with  Dr.Chasnis/ Dr.Brandon Claudene; [facility]. S/p  surgery [AUG 2024]- stable .   # Easy bruising [posterior Legs]sec Eliquis  + Acalabrutinib - stable.  # Mild to moderate anemia:[>>> Colo] Continue gentle iron/slow iron.Hb- 12.3- improving-    stable   # Low immunoglobulins IgG 330/Hx  severe respiratory infection/CLL-s/pI IVIG #4/4 [last April 2022].MARCH 2024-- IgG > 1500- stable   # COPD [Dr.Kasa]-chronic cough sputum.  No clinical evidence of any acute exacerbation at this time- stable   # A.fib-on amiodarone  [ Dr.Gollan] sinus rhythm-on Eliquis /CHF- lasix  40/d- s/p Cath [AUG, 2022- Echo-55%] stable   # Left sided CVA- [s/p rehab; April 2023]- stable  # Vaccination: s/p Flu shot [2024]; recommend COVID/RSV  DISPOSITION: # follow up in 4 weeks-  MD; labs- cbc/cmp; LDH; B12- DRI- CT prior-Dr.B

## 2024-03-16 NOTE — Progress Notes (Signed)
 Concerns today of recent pneumonia, requests refill for tramadol  for leg pain

## 2024-03-16 NOTE — Progress Notes (Signed)
 [facility] OFFICE PROGRESS NOTE  Patient Care Team: Gretel App, NP as PCP - General (Nurse Practitioner) Perla Evalene PARAS, MD as PCP - Cardiology (Cardiology) Fernande Elspeth BROCKS, MD as PCP - Electrophysiology (Cardiology) Perla Evalene PARAS, MD as Consulting Physician (Cardiology) Rennie Cindy SAUNDERS, MD as Medical Oncologist (Hematology and Oncology) Bula Powell PARAS, RN as Registered Nurse (Oncology)   Cancer Staging  No matching staging information was found for the patient.    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  MARCH 2017- CT N/C/A/P- NED. STOP Ritux; surveillance   # AUG 2019- CT/PET- progression/NO transformation;  # NOV 2019- Progression; started Ibrutinib  420 mg/d. STOPPED in end of feb sec to extreme fatigue/joint pains/cramps  #November 01, 2018-start ibrutinib  280 mg a day; December 20, 2018-discontinue ibrutinib  secondary multiple side effects.   #January 03, 2019 start Gazyva  + Ven [July]; finished Dec 2020-; started Ven maintenance [200 mg a day; DI-amiodarone ]; SEP 2022- congestive heart failure; s/p cardiac cath-noted to have CAD however no stents placed.  2D echo- EF-55%  #Mid March 2021-venetoclax  dose reduced to 100 mg [second diarrhea]. HELD in DEC 2022/JAN 2023 x3 months-multiple admissions to the hospital/pneumonia infection stroke x 3 m.   # MAY 2023-progression of disease in the neck ches; Nov 18, 2021-RE-started venetoclax ;   # JUNE 29th, 2023-progression of disease in the neck- STOP VENAOCLAX;   # AUG 16th, 2023 START ACALABRUTINIB  100 mg BID [previous intolerance to ibrutinib ; delayed sec to C.diff]    #? 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 Ambulating with cane.    [name] 78 y.o.  male CLL-high risk of recurrent/relapsed currently on  alcabrutinib; afib on eliquis , COPD-  is here for a follow up.  Complains of ongoing joint pains overall stable tramadol .  Otherwise no fever no chills.  Patient stated he was treated with antibiotics for pneumonia at the [facility].  No hospitalizations.  Chronic easy bruising.  Chronic mild fatigue.  Not any worse.  No falls.  No nausea no vomiting.  Denies any new lumps or bumps.  Denies any headaches.  Review of Systems  Constitutional:  Positive for malaise/fatigue. Negative for chills, diaphoresis and fever.  HENT:  Negative for nosebleeds and sore throat.   Eyes:  Negative for double vision.  Respiratory:  Negative for hemoptysis, shortness of breath and wheezing.   Cardiovascular:  Negative for chest pain, palpitations, orthopnea and leg swelling.  Gastrointestinal:  Negative for abdominal pain, blood in stool, constipation, heartburn, melena, nausea and vomiting.  Genitourinary:  Negative for dysuria, frequency and urgency.  Musculoskeletal:  Positive for back pain, joint pain and myalgias.  Skin: Negative.  Negative for itching and rash.  Neurological:  Negative for dizziness, tingling, focal weakness and  headaches.  Psychiatric/Behavioral:  Negative for depression. The patient is not nervous/anxious and does not have insomnia.    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)    a.) MV 08/23/2015: EF 50%; b.) TTE 08/23/2015: EF 55-60%, G1DD; c.) TTE 11/02/2015: EF 55-60%, mild LA dil; d.) TTE 02/04/2020: EF 55-60%, mild LVH, G1DD, PASP 44.3, mild-mod AoV sclerosis; e.) MV 12/25/2020: EF 45-54%; f.)  R/LHC 02/04/2021: mRA 13, mPA 27, mPCWP 22, LVEDP 23, Ao sat 99, PA sat 70, CO 6.5, CI 3.0; g.) TTE 02/19/2021: EF 55-60%, G1DD, mildly red RVSF, triv MR, mild-mod AoV scler   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 EF 55%, mild antlat HK;  c.) s/p R/LHC 02/04/2021: LM mild diff dzs, LAD 100p, LCX nl, RCA 50p, 95d, 99 side branch, RPAV fills via LCx. SVG->RPDA 100, SVG->OM1 100, LIMA->LAD patent. EF 35-45%.   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    Hyperlipidemia LDL goal <70     Hypertension    Hypokalemia    Ischemic cardiomyopathy    a.) MV 08/23/2015: EF 50%; b.) TTE 08/23/2015: EF 55-60%; c.) TTE 11/02/2015: EF 55-60%; d.) TTE 02/04/2020: EF 55-60%; e.) MV 12/25/2020: EF 45-54%; f.) R/LHC 02/04/2021: mRA 13, mPA 27, mPCWP 22, LVEDP 23, Ao sat 99, PA sat 70, CO 6.5, CI 3.0; g.) TTE 02/19/2021: EF 55-60%   Ischemic cerebrovascular accident (CVA) (HCC) 10/18/2021   Myocardial infarction (HCC) 2010   On amiodarone  therapy    On apixaban  therapy    OSA on CPAP    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   TIA (transient ischemic attack) 11/02/2015   PAST SURGICAL HISTORY :   Past Surgical History:  Procedure Laterality Date   ABDOMINAL HERNIA REPAIR     APPENDECTOMY  06/21/1985   CORONARY ARTERY BYPASS GRAFT N/A 05/09/2009   EP IMPLANTABLE DEVICE N/A 03/03/2016   Procedure: Pacemaker Implant;  Surgeon: Elspeth JAYSON Sage, MD;  Location: [facility] INVASIVE CV LAB;  Service: Cardiovascular;  Laterality: N/A;   FOREIGN BODY REMOVAL  1968   shrapnel in my tailbone   INGUINAL HERNIA REPAIR Right    JOINT REPLACEMENT Right 2018   LAPAROSCOPIC CHOLECYSTECTOMY     LEFT HEART CATH AND CORONARY ANGIOGRAPHY Left 05/08/2009   Procedure: LEFT HEART CATH AND CORONARY ANGIOGRAPHY; Location: [facility]: Surgeon: Darron Grass, MD   LEFT HEART CATH AND CORS/GRAFTS ANGIOGRAPHY Left 10/17/2009   Procedure: LEFT HEART CATH AND  CORS/GRAFTS ANGIOGRAPHY; Location: [facility]; Surgeon: Darron Grass, MD   RIGHT/LEFT HEART CATH AND CORONARY/GRAFT ANGIOGRAPHY N/A 02/04/2021   Procedure: RIGHT/LEFT HEART CATH AND CORONARY/GRAFT ANGIOGRAPHY;  Surgeon: Mady Bruckner, MD;  Location: [facility] INVASIVE CV LAB;  Service: Cardiovascular;  Laterality: N/A;   TONSILLECTOMY AND ADENOIDECTOMY  1956   TOTAL HIP ARTHROPLASTY Right 10/22/2016   Procedure: TOTAL HIP ARTHROPLASTY;  Surgeon: Lynwood SHAUNNA Hue, MD;  Location: [facility] ORS;  Service: Orthopedics;  Laterality: Right;   TOTAL HIP ARTHROPLASTY Left 11/04/2017   Procedure: TOTAL HIP ARTHROPLASTY;  Surgeon: Hue Lynwood SHAUNNA, MD;  Location: [facility] ORS;  Service: Orthopedics;  Laterality: Left;    FAMILY HISTORY :   Family History  Problem Relation Age of Onset   Heart disease Mother    Heart attack Mother    Coronary artery disease Other        family history    SOCIAL HISTORY:   Social History   Tobacco Use   Smoking status: Former    Current packs/day: 0.00    Average packs/day: 1 pack/day for 40.0 years (40.0 ttl pk-yrs)    Types: Cigarettes    Start date: 07/21/1966    Quit date: 07/21/2006    Years since quitting: 17.6   Smokeless tobacco: Never  Vaping Use   Vaping status: Former  Substance Use Topics   Alcohol use: Not Currently   Drug use: No    ALLERGIES:  is allergic to atorvastatin  and augmentin  [amoxicillin -pot clavulanate].  MEDICATIONS:  Current Outpatient Medications  Medication Sig Dispense Refill   acalabrutinib  maleate (CALQUENCE ) 100 MG tablet Take 1 tablet (100 mg total) by mouth 2 (two) times daily. 60 tablet 2   acetaminophen  (TYLENOL ) 325 MG tablet Take 1-2 tablets (325-650 mg total) by mouth every 4 (four) hours as needed for mild pain.     albuterol  (VENTOLIN  HFA) 108 (90 Base) MCG/ACT inhaler Inhale 2 puffs into the lungs every 6 (six) hours as needed for wheezing or shortness of breath. 1 each 0   amiodarone  (PACERONE ) 200 MG tablet TAKE 1/2 TABLET BY  MOUTH TWICE DAILY FOR 5 DAYS A WEEK 75 tablet 3   apixaban  (ELIQUIS ) 5 MG TABS tablet Take 1 tablet (5 mg total) by mouth 2 (two) times daily. 90 tablet 3   Apple Cider Vinegar 500 MG TABS Take 1 tablet by mouth daily.     azelastine  (ASTELIN ) 0.1 % nasal spray Place 2 sprays into both nostrils 2 (two) times daily. Use in each nostril as directed 30 mL 1   cetirizine (ZYRTEC) 10 MG tablet Take 10 mg by mouth daily as needed for  allergies.      Cholecalciferol (VITAMIN D3) 250 MCG (10000 UT) capsule Take 10,000 Units by mouth daily.     Coenzyme Q10 (COQ10) 200 MG CAPS Take 200 mg by mouth daily.     cyanocobalamin  (VITAMIN B12) 1000 MCG tablet Take 1,000 mcg by mouth daily.     ezetimibe  (ZETIA ) 10 MG tablet Take 1 tablet (10 mg total) by mouth daily. 90 tablet 3   ferrous sulfate  325 (65 FE) MG EC tablet Take 325 mg by mouth daily.     fluticasone -salmeterol (WIXELA INHUB) 100-50 MCG/ACT AEPB Inhale 2 puffs into the lungs 2 (two) times daily.     ipratropium-albuterol  (DUONEB) 0.5-2.5 (3) MG/3ML SOLN Take 3 mLs by nebulization every 6 (six) hours as needed (shorntess of breath). 360 mL 0   mirtazapine  (REMERON ) 15 MG tablet Take 15 mg by mouth at bedtime as needed (for panic associated with PTSD).     mometasone -formoterol  (DULERA ) 200-5 MCG/ACT AERO Inhale 2 puffs into the lungs 2 (two) times daily.     Multiple Vitamin (MULTIVITAMIN WITH MINERALS) TABS tablet Take 1 tablet by mouth daily.     pantoprazole  (PROTONIX ) 40 MG tablet TAKE 1 TABLET(40 MG) BY MOUTH DAILY 90 tablet 3   REPATHA  SURECLICK 140 MG/ML SOAJ ADMINISTER 1 ML UNDER THE SKIN EVERY 14 DAYS 2 mL 11   tamsulosin  (FLOMAX ) 0.4 MG CAPS capsule TAKE 1 CAPSULE(0.4 MG) BY MOUTH DAILY 30 capsule 3   Tiotropium Bromide  Monohydrate 2.5 MCG/ACT AERS Inhale 2 puffs into the lungs at bedtime. Spiriva      tiZANidine  (ZANAFLEX ) 4 MG capsule Take 1 capsule (4 mg total) by mouth 3 (three) times daily. This can make you sleepy. 60 capsule 0    doxycycline  (VIBRAMYCIN ) 100 MG capsule Take 1 capsule (100 mg total) by mouth 2 (two) times daily. (Patient not taking: Reported on 03/16/2024) 14 capsule 0   traMADol  (ULTRAM ) 50 MG tablet Take 1 tablet (50 mg total) by mouth 2 (two) times daily. 60 tablet 0   No current facility-administered medications for this visit.   Facility-Administered Medications Ordered in Other Visits  Medication Dose Route Frequency Provider Last Rate Last Admin   potassium chloride  10 mEq in 100 mL IVPB  10 mEq Intravenous Once Borders, Fonda SAUNDERS, NP        PHYSICAL EXAMINATION: ECOG PERFORMANCE STATUS: 1 - Symptomatic but completely ambulatory  BP (!) 165/75 (BP Location: Right Arm) Comment: advised to f/u PCP  Pulse 61   Temp (!) 96.9 F (36.1 C) (Tympanic)   Resp 18   Ht 5' 9 (1.753 m)   Wt 203 lb (92.1 kg)   SpO2 97%   BMI 29.98 kg/m   Filed Weights   03/16/24 1020  Weight: 203 lb (92.1 kg)    Physical Exam HENT:     Head: Normocephalic and atraumatic.     Mouth/Throat:     Pharynx: No oropharyngeal exudate.  Eyes:     Pupils: Pupils are equal, round, and reactive to light.  Cardiovascular:     Rate and Rhythm: Normal rate and regular rhythm.  Pulmonary:     Effort: No respiratory distress.     Breath sounds: No wheezing.  Abdominal:     General: Bowel sounds are normal. There is no distension.     Palpations: Abdomen is soft. There is no mass.     Tenderness: There is no abdominal tenderness. There is no guarding or rebound.  Musculoskeletal:  General: No tenderness. Normal range of motion.     Cervical back: Normal range of motion and neck supple.  Skin:    General: Skin is warm.  Neurological:     Mental Status: He is alert and oriented to person, place, and time.  Psychiatric:        Mood and Affect: Affect normal.     LABORATORY DATA:  I have reviewed the data as listed    Component Value Date/Time   NA 139 03/16/2024 0958   NA 143 03/11/2021 1136   NA 139  10/11/2014 1800   K 4.4 03/16/2024 0958   K 3.3 (L) 10/11/2014 1800   CL 107 03/16/2024 0958   CL 106 10/11/2014 1800   CO2 25 03/16/2024 0958   CO2 27 10/11/2014 1800   GLUCOSE 104 (H) 03/16/2024 0958   GLUCOSE 107 (H) 10/11/2014 1800   BUN 27 (H) 03/16/2024 0958   BUN 13 03/11/2021 1136   BUN 15 10/11/2014 1800   CREATININE 1.21 03/16/2024 0958   CREATININE 0.89 10/11/2014 1800   CALCIUM  9.2 03/16/2024 0958   CALCIUM  8.8 (L) 10/11/2014 1800   PROT 6.6 03/16/2024 0958   PROT 6.7 05/18/2017 1048   PROT 6.4 (L) 10/11/2014 1800   ALBUMIN  3.8 03/16/2024 0958   ALBUMIN  4.3 05/18/2017 1048   ALBUMIN  4.1 10/11/2014 1800   AST 18 03/16/2024 0958   ALT 15 03/16/2024 0958   ALT 22 10/11/2014 1800   ALKPHOS 74 03/16/2024 0958   ALKPHOS 61 10/11/2014 1800   BILITOT 1.1 03/16/2024 0958   GFRNONAA >60 03/16/2024 0958   GFRNONAA >60 10/11/2014 1800   GFRAA >60 04/12/2020 0959   GFRAA >60 10/11/2014 1800    No results found for: SPEP, UPEP  Lab Results  Component Value Date   WBC 6.5 03/16/2024   NEUTROABS 2.7 03/16/2024   HGB 11.7 (L) 03/16/2024   HCT 36.7 (L) 03/16/2024   MCV 101.9 (H) 03/16/2024   PLT 144 (L) 03/16/2024      Chemistry      Component Value Date/Time   NA 139 03/16/2024 0958   NA 143 03/11/2021 1136   NA 139 10/11/2014 1800   K 4.4 03/16/2024 0958   K 3.3 (L) 10/11/2014 1800   CL 107 03/16/2024 0958   CL 106 10/11/2014 1800   CO2 25 03/16/2024 0958   CO2 27 10/11/2014 1800   BUN 27 (H) 03/16/2024 0958   BUN 13 03/11/2021 1136   BUN 15 10/11/2014 1800   CREATININE 1.21 03/16/2024 0958   CREATININE 0.89 10/11/2014 1800      Component Value Date/Time   CALCIUM  9.2 03/16/2024 0958   CALCIUM  8.8 (L) 10/11/2014 1800   ALKPHOS 74 03/16/2024 0958   ALKPHOS 61 10/11/2014 1800   AST 18 03/16/2024 0958   ALT 15 03/16/2024 0958   ALT 22 10/11/2014 1800   BILITOT 1.1 03/16/2024 0958       RADIOGRAPHIC STUDIES: I have personally reviewed the  radiological images as listed and agreed with the findings in the report. No results found.   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  Patient currently on Acalabrutnib [AUG 16th, 2023]-  Patient admits to compliance; March- April CT CAP:  Decreased size of lymph nodes above and below the diaphragm; No splenomegaly. -Continue Acalbrutinib 100 mg BID.   # Clinically stable; no evidence of any progression - stable- Will repeat scan in next 6 weeks-  will  order  at next visit  # Neck pain/Joint pains/ Muscle pain- s/p evaluation with  Dr.Chasnis/ Dr.Brandon Claudene; [facility]. S/p  surgery [AUG 2024]- stable .   # Easy bruising [posterior Legs]sec Eliquis  + Acalabrutinib - stable.  # Mild to moderate anemia:[>>> Colo] Continue gentle iron/slow iron.Hb- 12.3- improving-    stable   # Low immunoglobulins IgG 330/Hx  severe respiratory infection/CLL-s/pI IVIG #4/4 [last April 2022].MARCH 2024-- IgG > 1500- stable   # COPD [Dr.Kasa]-chronic cough sputum.  No clinical evidence of any acute exacerbation at this time- stable   # A.fib-on amiodarone  [ Dr.Gollan] sinus rhythm-on Eliquis /CHF- lasix  40/d- s/p Cath [AUG, 2022- Echo-55%] stable   # Left sided CVA- [s/p rehab; April 2023]- stable  # Vaccination: s/p Flu shot [2024]; recommend COVID/RSV  DISPOSITION: # follow up in 4 weeks-  MD; labs- cbc/cmp; LDH; B12- [facility]- CT prior-Dr.B      Orders Placed This Encounter  Procedures   CT SOFT TISSUE NECK W CONTRAST    Standing Status:   Future    Expected Date:   04/13/2024    Expiration Date:   03/16/2025    If indicated for the ordered procedure, I authorize the administration of contrast media per Radiology protocol:   Yes    Preferred imaging location?:   [facility]   CT CHEST ABDOMEN PELVIS W CONTRAST    Standing Status:   Future    Expected Date:   04/13/2024    Expiration Date:   03/16/2025    If indicated for  the ordered procedure, I authorize the administration of contrast media per Radiology protocol:   Yes    Does the patient have a contrast media/X-ray dye allergy?:   No    Preferred imaging location?:   [facility]    If indicated for the ordered procedure, I authorize the administration of oral contrast media per Radiology protocol:   Yes   CBC with Differential (Cancer Center Only)    Standing Status:   Future    Expected Date:   04/13/2024    Expiration Date:   07/12/2024   CMP (Cancer Center only)    Standing Status:   Future    Expected Date:   04/13/2024    Expiration Date:   07/12/2024   Lactate dehydrogenase    Standing Status:   Future    Expected Date:   04/13/2024    Expiration Date:   07/12/2024   Vitamin B12    Standing Status:   Future    Expected Date:   04/13/2024    Expiration Date:   07/12/2024   All questions were answered. The patient knows to call the clinic with any problems, questions or concerns.     Cindy JONELLE Joe, MD 03/16/2024 10:48 AM

## 2024-03-28 ENCOUNTER — Other Ambulatory Visit: Payer: Self-pay

## 2024-04-05 ENCOUNTER — Ambulatory Visit
Admission: RE | Admit: 2024-04-05 | Discharge: 2024-04-05 | Disposition: A | Discharge status: Home / Self Care | Source: Ambulatory Visit | Attending: Internal Medicine

## 2024-04-05 ENCOUNTER — Encounter: Payer: Self-pay | Admitting: Internal Medicine

## 2024-04-05 ENCOUNTER — Other Ambulatory Visit

## 2024-04-05 ENCOUNTER — Ambulatory Visit
Admission: RE | Admit: 2024-04-05 | Discharge: 2024-04-05 | Disposition: A | Discharge status: Home / Self Care | Source: Ambulatory Visit | Attending: Internal Medicine | Admitting: Internal Medicine

## 2024-04-05 ENCOUNTER — Other Ambulatory Visit: Payer: Self-pay | Admitting: *Deleted

## 2024-04-05 DIAGNOSIS — C9110 Chronic lymphocytic leukemia of B-cell type not having achieved remission: Secondary | ICD-10-CM

## 2024-04-05 MED ORDER — IOHEXOL 300 MG/ML  SOLN
100.0000 mL | Freq: Once | INTRAMUSCULAR | Status: AC | PRN
  Administered 2024-04-05: 100 mL via INTRAVENOUS

## 2024-04-11 NOTE — Progress Notes (Unsigned)
 Electrophysiology Clinic Note    Date:  04/12/2024  Patient ID:  [name], [name] February 24, 1946, MRN [id number] PCP:  Gretel App, NP  Cardiologist:  Evalene Lunger, MD   Electrophysiologist:  Fonda Kitty, MD  Electrophysiology APP:  Cloa Bushong, NP     Discussed the use of AI scribe software for clinical note transcription with the patient, who gave verbal consent to proceed.   Patient Profile    Chief Complaint: AF, amiodarone  follow-up  History of Present Illness: [name] is a 78 y.o. male with PMH notable for SND s/p PPM, CHF, CAD s/p CABG, parox AFib, HTN, COPD, OSA, CLL ; seen today for Fonda Kitty, MD (previously Dr. Fernande) for routine electrophysiology followup.   I last saw him 09/2023 at which time he was recovering from PNA. No AFib on 500mg  amio / week.   He saw Dr. Gollan 01/2024 where BP was borderline, no med changes made.   On follow-up today, he is doing very well from a cardiac standpoint. He denies chest pain, chest pressure, palpitations. He is not aware of any recent AF episodes, continues to take 100mg  amiodarone  5 days / week. Diligently takes eliquis  BID, has bruising to forearms, denies bleeding via GI/GU tracts.   He [facility] app[facility] tomorrow with labs.   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      Arrhythmia/Device History MDT dual chamber PPM, imp 02/2016; dx SND  AAD - amiodarone     ROS:  Please see the history of present illness. All other systems are reviewed and otherwise negative.    Physical Exam    VS:  BP (!) 140/64 (BP Location: Left Arm, Patient Position: Sitting, Cuff Size: Normal)   Pulse 69   Ht 5' 8 (1.727 m)   Wt 203 lb 12.8 oz (92.4 kg)   SpO2 96%   BMI 30.99 kg/m  BMI: Body mass index is 30.99 kg/m.      Wt Readings from Last 3 Encounters:  04/12/24 203 lb 12.8 oz (92.4 kg)  03/16/24 203 lb (92.1 kg)  02/08/24 196 lb 6 oz (89.1 kg)     GEN- The patient  is well appearing, alert and oriented x 3 today.   Lungs- Clear to ausculation bilaterally, normal work of breathing.  Heart- Regular rate and rhythm, no murmurs, rubs or gallops Extremities- No peripheral edema, warm, dry Skin-  device pocket well-healed, no tethering   Device interrogation done today and reviewed by myself:  Battery 2 years Lead thresholds, impedence, sensing stable  Atrially dependent No episodes No changes made today   Studies Reviewed   Previous EP, cardiology notes.    EKG is not ordered. Personal review of EKG from 02/08/2024 shows:  SR with 1st deg HB, rate 67; PR 220        TTE, 11/19/2023 1. Left ventricular ejection fraction, by estimation, is 50 to 55%. The left ventricle has low normal function. The left ventricle demonstrates regional wall motion abnormalities (anterior and anteroseptal wall hypokinesis, s/p CABG). There is mild left  ventricular hypertrophy. Left ventricular diastolic parameters are consistent with Grade I diastolic dysfunction (impaired relaxation).   2. Right ventricular systolic function is normal. The right ventricular size is normal. There is normal pulmonary artery systolic pressure. The estimated right ventricular systolic pressure is 33.5 mmHg.   3. The mitral valve is normal in structure. No evidence of mitral valve regurgitation. No evidence of mitral  stenosis.   4. The aortic valve is normal in structure. Aortic valve regurgitation is not visualized. No aortic stenosis is present.   5. The inferior vena cava is normal in size with greater than 50% respiratory variability, suggesting right atrial pressure of 3 mmHg.    TTE, 02/19/2021  1. Left ventricular ejection fraction, by estimation, is 55 to 60%. The left ventricle has normal function. The left ventricle has no regional wall motion abnormalities. Left ventricular diastolic parameters are consistent with Grade I diastolic dysfunction (impaired relaxation).   2. Right  ventricular systolic function is mildly reduced. The right ventricular size is normal. Mildly increased right ventricular wall thickness. Tricuspid regurgitation signal is inadequate for assessing PA pressure.   3. The mitral valve was not well visualized. Trivial mitral valve regurgitation. No evidence of mitral stenosis.   4. The aortic valve has an indeterminant number of cusps. There is mild calcification of the aortic valve. There is moderate thickening of the aortic valve. Aortic valve regurgitation is not visualized. Mild to moderate aortic valve sclerosis/calcification is present, without any evidence of aortic stenosis.   5. The inferior vena cava is dilated in size with >50% respiratory variability, suggesting right atrial pressure of 8 mmHg.    R/LHC, 02/04/2021 Severe two-vessel coronary artery disease, including chronic total occlusion of the proximal LAD and 90-99% distal RCA/rPLA disease. RPL branches and rPDA fill via left-to-right collaterals. Widely patent LIMA-LAD. Chronically occluded SVG-OM1 and SVG-rPDA. Mildly to moderately reduced left ventricular systolic function (LVEDP 40-45%). Mildly elevated left heart and pulmonary artery pressures. Moderately elevated right heart filling pressures. Normal Fick cardiac output/index.    Assessment and Plan     #) parox AFib #) amiodarone  monitoring No recent AF episodes Continue 100mg  amiodarone  5 days/week Recent LFTs stable, update thyroid  labs tomorrow with [facility] work  #) Hypercoag d/t afib CHA2DS2-VASc Score = at least 6 [CHF History: 0, HTN History: 1, Diabetes History: 0, Stroke History: 2, Vascular Disease History: 1, Age Score: 2, Gender Score: 0].  Therefore, the patient's annual risk of stroke is 9.7 %.    Stroke ppx - 5mg  eliquis  BID, appropriately dosed No bleeding concerns  #) SND s/p PPM Device functioning well, see paceart for details Battery 2 years Atrially dependent today HR histograms improved  from prior        Current medicines are reviewed at length with the patient today.   The patient does not have concerns regarding his medicines.  The following changes were made today:  none  Labs/ tests ordered today include:  Orders Placed This Encounter  Procedures   TSH   T4, free     Disposition: Follow up with Dr. Kennyth or EP APP in 6 months   Signed, Chantal Needle, NP  04/12/24  11:10 AM  Electrophysiology [facility]

## 2024-04-12 ENCOUNTER — Ambulatory Visit: Attending: Cardiology | Admitting: Cardiology

## 2024-04-12 ENCOUNTER — Encounter: Payer: Self-pay | Admitting: Cardiology

## 2024-04-12 VITALS — BP 140/64 | HR 69 | Ht 68.0 in | Wt 203.8 lb

## 2024-04-12 DIAGNOSIS — D6869 Other thrombophilia: Secondary | ICD-10-CM

## 2024-04-12 DIAGNOSIS — I495 Sick sinus syndrome: Secondary | ICD-10-CM | POA: Diagnosis not present

## 2024-04-12 DIAGNOSIS — Z950 Presence of cardiac pacemaker: Secondary | ICD-10-CM | POA: Diagnosis not present

## 2024-04-12 DIAGNOSIS — Z5181 Encounter for therapeutic drug level monitoring: Secondary | ICD-10-CM

## 2024-04-12 DIAGNOSIS — Z79899 Other long term (current) drug therapy: Secondary | ICD-10-CM

## 2024-04-12 DIAGNOSIS — I480 Paroxysmal atrial fibrillation: Secondary | ICD-10-CM

## 2024-04-12 LAB — CUP PACEART INCLINIC DEVICE CHECK
Date Time Interrogation Session: 20250923112700
Implantable Lead Connection Status: 753985
Implantable Lead Connection Status: 753985
Implantable Lead Implant Date: 20170814
Implantable Lead Implant Date: 20170814
Implantable Lead Location: 753859
Implantable Lead Location: 753860
Implantable Lead Model: 5076
Implantable Lead Model: 5076
Implantable Pulse Generator Implant Date: 20170814

## 2024-04-12 NOTE — Patient Instructions (Signed)
 Medication Instructions:  Your physician recommends that you continue on your current medications as directed. Please refer to the Current Medication list given to you today.    *If you need a refill on your cardiac medications before your next appointment, please call your pharmacy*  Lab Work: Your provider would like for you to have following labs drawn  TSH, T4.     Testing/Procedures: No test ordered today   Follow-Up: At [facility], you and your health needs are our priority.  As part of our continuing mission to provide you with exceptional heart care, our providers are all part of one team.  This team includes your primary Cardiologist (physician) and Advanced Practice Providers or APPs (Physician Assistants and Nurse Practitioners) who all work together to provide you with the care you need, when you need it.  Your next appointment:   6 month(s)  Provider:   Ole Holts, MD or Suzann Riddle, NP

## 2024-04-13 ENCOUNTER — Encounter: Payer: Self-pay | Admitting: Internal Medicine

## 2024-04-13 ENCOUNTER — Ambulatory Visit: Payer: Self-pay | Admitting: Internal Medicine

## 2024-04-13 ENCOUNTER — Inpatient Hospital Stay (HOSPITAL_BASED_OUTPATIENT_CLINIC_OR_DEPARTMENT_OTHER): Admitting: Internal Medicine

## 2024-04-13 ENCOUNTER — Other Ambulatory Visit: Payer: Self-pay

## 2024-04-13 ENCOUNTER — Inpatient Hospital Stay: Attending: Internal Medicine

## 2024-04-13 VITALS — BP 106/47 | HR 67 | Temp 98.4°F | Resp 18 | Ht 68.0 in | Wt 203.0 lb

## 2024-04-13 DIAGNOSIS — I4891 Unspecified atrial fibrillation: Secondary | ICD-10-CM | POA: Insufficient documentation

## 2024-04-13 DIAGNOSIS — Z7901 Long term (current) use of anticoagulants: Secondary | ICD-10-CM | POA: Diagnosis not present

## 2024-04-13 DIAGNOSIS — J449 Chronic obstructive pulmonary disease, unspecified: Secondary | ICD-10-CM | POA: Diagnosis not present

## 2024-04-13 DIAGNOSIS — R5382 Chronic fatigue, unspecified: Secondary | ICD-10-CM | POA: Insufficient documentation

## 2024-04-13 DIAGNOSIS — Z87891 Personal history of nicotine dependence: Secondary | ICD-10-CM | POA: Insufficient documentation

## 2024-04-13 DIAGNOSIS — C9110 Chronic lymphocytic leukemia of B-cell type not having achieved remission: Secondary | ICD-10-CM

## 2024-04-13 DIAGNOSIS — Z79899 Other long term (current) drug therapy: Secondary | ICD-10-CM | POA: Insufficient documentation

## 2024-04-13 DIAGNOSIS — C9112 Chronic lymphocytic leukemia of B-cell type in relapse: Secondary | ICD-10-CM | POA: Insufficient documentation

## 2024-04-13 DIAGNOSIS — D649 Anemia, unspecified: Secondary | ICD-10-CM | POA: Insufficient documentation

## 2024-04-13 LAB — CMP (CANCER CENTER ONLY)
ALT: 16 U/L (ref 0–44)
AST: 20 U/L (ref 15–41)
Albumin: 4 g/dL (ref 3.5–5.0)
Alkaline Phosphatase: 70 U/L (ref 38–126)
Anion gap: 8 (ref 5–15)
BUN: 24 mg/dL — ABNORMAL HIGH (ref 8–23)
CO2: 23 mmol/L (ref 22–32)
Calcium: 9.1 mg/dL (ref 8.9–10.3)
Chloride: 107 mmol/L (ref 98–111)
Creatinine: 0.95 mg/dL (ref 0.61–1.24)
GFR, Estimated: >60 mL/min (ref >60)
Glucose, Bld: 112 mg/dL — ABNORMAL HIGH (ref 70–99)
Potassium: 4.3 mmol/L (ref 3.5–5.1)
Sodium: 138 mmol/L (ref 135–145)
Total Bilirubin: 1.1 mg/dL (ref 0.0–1.2)
Total Protein: 6.7 g/dL (ref 6.5–8.1)

## 2024-04-13 LAB — CBC WITH DIFFERENTIAL (CANCER CENTER ONLY)
Abs Immature Granulocytes: 0.03 K/uL (ref 0.00–0.07)
Basophils Absolute: 0 K/uL (ref 0.0–0.1)
Basophils Relative: 1 %
Eosinophils Absolute: 0.1 K/uL (ref 0.0–0.5)
Eosinophils Relative: 1 %
HCT: 36.5 % — ABNORMAL LOW (ref 39.0–52.0)
Hemoglobin: 12.3 g/dL — ABNORMAL LOW (ref 13.0–17.0)
Immature Granulocytes: 0 %
Lymphocytes Relative: 39 %
Lymphs Abs: 3.4 K/uL (ref 0.7–4.0)
MCH: 33.4 pg (ref 26.0–34.0)
MCHC: 33.7 g/dL (ref 30.0–36.0)
MCV: 99.2 fL (ref 80.0–100.0)
Monocytes Absolute: 0.6 K/uL (ref 0.1–1.0)
Monocytes Relative: 7 %
Neutro Abs: 4.5 K/uL (ref 1.7–7.7)
Neutrophils Relative %: 52 %
Platelet Count: 143 K/uL — ABNORMAL LOW (ref 150–400)
RBC: 3.68 MIL/uL — ABNORMAL LOW (ref 4.22–5.81)
RDW: 14.5 % (ref 11.5–15.5)
WBC Count: 8.6 K/uL (ref 4.0–10.5)
nRBC: 0 % (ref 0.0–0.2)

## 2024-04-13 LAB — VITAMIN B12: Vitamin B-12: 776 pg/mL (ref 180–914)

## 2024-04-13 LAB — T4, FREE: Free T4: 0.95 ng/dL (ref 0.61–1.12)

## 2024-04-13 LAB — TSH: TSH: 1.079 u[IU]/mL (ref 0.350–4.500)

## 2024-04-13 LAB — LACTATE DEHYDROGENASE: LDH: 140 U/L (ref 98–192)

## 2024-04-13 NOTE — Progress Notes (Signed)
 Pt has order from heart care for TSH, our lab has drawn it, is it ok to add?  CT neck and CAP 04/05/24.

## 2024-04-13 NOTE — Progress Notes (Signed)
 [facility] OFFICE PROGRESS NOTE  Patient Care Team: Gretel App, NP as PCP - General (Nurse Practitioner) Perla Evalene PARAS, MD as PCP - Cardiology (Cardiology) Kennyth Chew, MD as PCP - Electrophysiology (Clinical Cardiac Electrophysiology) Perla Evalene PARAS, MD as Consulting Physician (Cardiology) Rennie Cindy SAUNDERS, MD as Medical Oncologist (Hematology and Oncology) Bula Powell PARAS, RN as Registered Nurse (Oncology) Riddle, Suzann, NP as Nurse Practitioner (Clinical Cardiac Electrophysiology)   Cancer Staging  No matching staging information was found for the patient.    Oncology History Overview Note  # AUG 2015- SLL/CLL [Right Ax Ln Bx] s/p Benda-Rituxan  x6 [finished March 2016]; Maintenance Rituxan  q 34m [started April 2016; Dr.Pandit];Last Ritux Jan 2017.  MARCH 2017- CT N/C/A/P- NED. STOP Ritux; surveillance   # AUG 2019- CT/PET- progression/NO transformation;  # NOV 2019- Progression; started Ibrutinib  420 mg/d. STOPPED in end of feb sec to extreme fatigue/joint pains/cramps  #November 01, 2018-start ibrutinib  280 mg a day; December 20, 2018-discontinue ibrutinib  secondary multiple side effects.   #January 03, 2019 start Gazyva  + Ven [July]; finished Dec 2020-; started Ven maintenance [200 mg a day; DI-amiodarone ]; SEP 2022- congestive heart failure; s/p cardiac cath-noted to have CAD however no stents placed.  2D echo- EF-55%  #Mid March 2021-venetoclax  dose reduced to 100 mg [second diarrhea]. HELD in DEC 2022/JAN 2023 x3 months-multiple admissions to the hospital/pneumonia infection stroke x 3 m.   # MAY 2023-progression of disease in the neck ches; Nov 18, 2021-RE-started venetoclax ;   # JUNE 29th, 2023-progression of disease in the neck- STOP VENAOCLAX;   # AUG 16th, 2023 START ACALABRUTINIB  100 mg BID [previous intolerance to ibrutinib ; delayed sec to C.diff]    #? 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 Ambulating with cane.    [name] 78 y.o.  male CLL-high risk of recurrent/relapsed currently on  alcabrutinib; afib on eliquis , COPD-  is here for a follow up and review results of the CT scan.  Patient admits to ongoing bruising not any worse.  No bleeding.  Chronic mild fatigue not any worse.  Chronic joint pains back pain not any worse.  However no further hospitalizations.  No further pneumonias.  Denies any new lumps or bumps.  Denies any headaches.  Admits to compliance with his acalabrutinib .  Review of Systems  Constitutional:  Positive for malaise/fatigue. Negative for chills, diaphoresis and fever.  HENT:  Negative for nosebleeds and sore throat.   Eyes:  Negative for double vision.  Respiratory:  Negative for hemoptysis, shortness of breath and wheezing.   Cardiovascular:  Negative for chest pain, palpitations, orthopnea and leg swelling.  Gastrointestinal:  Negative for abdominal pain, blood in stool, constipation, heartburn, melena, nausea and vomiting.  Genitourinary:  Negative for dysuria, frequency and urgency.  Musculoskeletal:  Positive for back pain, joint pain and myalgias.  Skin: Negative.  Negative for itching and rash.  Neurological:  Negative for dizziness, tingling, focal weakness and headaches.  Psychiatric/Behavioral:  Negative for depression. The patient is not  nervous/anxious and does not have insomnia.    PAST MEDICAL HISTORY :  Past Medical History:  Diagnosis Date   Agent orange exposure    Anxiety    Arthritis    Atrial fibrillation (HCC) 2013   a.) Dx'd in 2013; recurrence 02/2014; b.) CHA2DS2-VASc = 7 (age x2, HFimpEF, HTN, CVA x 2, previous MI); c.) cardiac rate/rhythm maintained on oral amiodarone ; chronically anticoagulated using apixaban    BPH (benign prostatic hyperplasia)    C. 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)    a.) MV 08/23/2015: EF 50%; b.) TTE 08/23/2015: EF 55-60%, G1DD; c.) TTE 11/02/2015: EF 55-60%, mild LA dil; d.) TTE 02/04/2020: EF 55-60%, mild LVH, G1DD, PASP 44.3, mild-mod AoV sclerosis; e.) MV 12/25/2020: EF 45-54%; f.)  R/LHC 02/04/2021: mRA 13, mPA 27, mPCWP 22, LVEDP 23, Ao sat 99, PA sat 70, CO 6.5, CI 3.0; g.) TTE 02/19/2021: EF 55-60%, G1DD, mildly red RVSF, triv MR, mild-mod AoV scler   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 EF 55%, mild antlat HK;  c.) s/p R/LHC 02/04/2021: LM mild diff dzs, LAD 100p, LCX nl, RCA 50p, 95d, 99 side branch, RPAV fills via LCx. SVG->RPDA 100, SVG->OM1 100, LIMA->LAD patent. EF 35-45%.   GERD (gastroesophageal reflux disease)    H/O tobacco use, presenting hazards to health    History of 2019 novel coronavirus disease (COVID-19) 09/04/2021   a.) PCR (+) 09/04/2021, 09/13/2021; b.) home  rapid Ag (+) 03/10/2023   HOH (hard of hearing) - requires BILATERAL hearing aids    Hyperlipidemia LDL goal <70    Hypertension    Hypokalemia    Ischemic cardiomyopathy    a.) MV 08/23/2015: EF 50%; b.) TTE 08/23/2015: EF 55-60%; c.) TTE 11/02/2015: EF 55-60%; d.) TTE 02/04/2020: EF 55-60%; e.) MV 12/25/2020: EF 45-54%; f.) R/LHC 02/04/2021: mRA 13, mPA 27, mPCWP 22, LVEDP 23, Ao sat 99, PA sat 70, CO 6.5, CI 3.0; g.) TTE 02/19/2021: EF 55-60%   Ischemic cerebrovascular accident (CVA) (HCC) 10/18/2021   Myocardial infarction (HCC) 2010   On amiodarone  therapy    On apixaban  therapy    OSA on CPAP    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   TIA (transient ischemic attack) 11/02/2015   PAST SURGICAL HISTORY :   Past Surgical History:  Procedure Laterality Date   ABDOMINAL HERNIA REPAIR     APPENDECTOMY  06/21/1985   CORONARY ARTERY BYPASS GRAFT N/A 05/09/2009   EP IMPLANTABLE DEVICE N/A 03/03/2016   Procedure: Pacemaker Implant;  Surgeon: Elspeth JAYSON Sage, MD;  Location: [facility] INVASIVE CV LAB;  Service: Cardiovascular;  Laterality: N/A;   FOREIGN BODY REMOVAL  1968   shrapnel in my tailbone   INGUINAL HERNIA REPAIR Right    JOINT REPLACEMENT Right 2018   LAPAROSCOPIC CHOLECYSTECTOMY     LEFT HEART CATH AND CORONARY ANGIOGRAPHY Left 05/08/2009   Procedure: LEFT HEART CATH AND CORONARY ANGIOGRAPHY; Location: [facility]:  Surgeon: Darron Grass, MD   LEFT HEART CATH AND CORS/GRAFTS ANGIOGRAPHY Left 10/17/2009   Procedure: LEFT HEART CATH AND CORS/GRAFTS ANGIOGRAPHY; Location: [facility]; Surgeon: Darron Grass, MD   RIGHT/LEFT HEART CATH AND CORONARY/GRAFT ANGIOGRAPHY N/A 02/04/2021   Procedure: RIGHT/LEFT HEART CATH AND CORONARY/GRAFT ANGIOGRAPHY;  Surgeon: Mady Bruckner, MD;  Location: [facility] INVASIVE CV LAB;  Service: Cardiovascular;  Laterality: N/A;   TONSILLECTOMY AND ADENOIDECTOMY  1956   TOTAL HIP ARTHROPLASTY Right 10/22/2016   Procedure: TOTAL HIP ARTHROPLASTY;  Surgeon: Lynwood SHAUNNA Hue, MD;  Location: [facility] ORS;  Service: Orthopedics;  Laterality: Right;   TOTAL HIP ARTHROPLASTY Left 11/04/2017   Procedure: TOTAL HIP ARTHROPLASTY;  Surgeon: Hue Lynwood SHAUNNA, MD;  Location: [facility] ORS;  Service: Orthopedics;  Laterality: Left;    FAMILY HISTORY :   Family History  Problem Relation Age of Onset   Heart disease Mother    Heart attack Mother    Coronary artery disease Other        family history    SOCIAL HISTORY:   Social History   Tobacco Use   Smoking status: Former    Current packs/day: 0.00    Average packs/day: 1 pack/day for 40.0 years (40.0 ttl pk-yrs)    Types: Cigarettes    Start date: 07/21/1966    Quit date: 07/21/2006    Years since quitting: 17.7   Smokeless tobacco: Never  Vaping Use   Vaping status: Former  Substance Use Topics   Alcohol use: Not Currently   Drug use: No    ALLERGIES:  is allergic to atorvastatin  and augmentin  [amoxicillin -pot clavulanate].  MEDICATIONS:  Current Outpatient Medications  Medication Sig Dispense Refill   acalabrutinib  maleate (CALQUENCE ) 100 MG tablet Take 1 tablet (100 mg total) by mouth 2 (two) times daily. 60 tablet 2   acetaminophen  (TYLENOL ) 325 MG tablet Take 1-2 tablets (325-650 mg total) by mouth every 4 (four) hours as needed for mild pain.     albuterol  (VENTOLIN  HFA) 108 (90 Base) MCG/ACT inhaler Inhale 2 puffs into the lungs every 6  (six) hours as needed for wheezing or shortness of breath. 1 each 0   amiodarone  (PACERONE ) 200 MG tablet TAKE 1/2 TABLET BY MOUTH TWICE DAILY FOR 5 DAYS A WEEK 75 tablet 3   apixaban  (ELIQUIS ) 5 MG TABS tablet Take 1 tablet (5 mg total) by mouth 2 (two) times daily. 90 tablet 3   Apple Cider Vinegar 500 MG TABS Take 1 tablet by mouth daily.     azelastine  (ASTELIN ) 0.1 % nasal spray Place 2 sprays into both nostrils 2 (two) times daily. Use in each nostril as directed 30 mL 1   cetirizine (ZYRTEC) 10 MG tablet  Take 10 mg by mouth daily as needed for allergies.      Cholecalciferol (VITAMIN D3) 250 MCG (10000 UT) capsule Take 10,000 Units by mouth daily.     Coenzyme Q10 (COQ10) 200 MG CAPS Take 200 mg by mouth daily.     cyanocobalamin  (VITAMIN B12) 1000 MCG tablet Take 1,000 mcg by mouth daily.     ezetimibe  (ZETIA ) 10 MG tablet Take 1 tablet (10 mg total) by mouth daily. 90 tablet 3   ferrous sulfate  325 (65 FE) MG EC tablet Take 325 mg by mouth daily.     fluticasone -salmeterol (WIXELA INHUB) 100-50 MCG/ACT AEPB Inhale 2 puffs into the lungs 2 (two) times daily.     ipratropium-albuterol  (DUONEB) 0.5-2.5 (3) MG/3ML SOLN Take 3 mLs by nebulization every 6 (six) hours as needed (shorntess of breath). 360 mL 0   mirtazapine  (REMERON ) 15 MG tablet Take 15 mg by mouth at bedtime as needed (for panic associated with PTSD).     mometasone -formoterol  (DULERA ) 200-5 MCG/ACT AERO Inhale 2 puffs into the lungs 2 (two) times daily.     Multiple Vitamin (MULTIVITAMIN WITH MINERALS) TABS tablet Take 1 tablet by mouth daily.     pantoprazole  (PROTONIX ) 40 MG tablet TAKE 1 TABLET(40 MG) BY MOUTH DAILY 90 tablet 3   REPATHA  SURECLICK 140 MG/ML SOAJ ADMINISTER 1 ML UNDER THE SKIN EVERY 14 DAYS 2 mL 11   tamsulosin  (FLOMAX ) 0.4 MG CAPS capsule TAKE 1 CAPSULE(0.4 MG) BY MOUTH DAILY 30 capsule 3   Tiotropium Bromide  Monohydrate 2.5 MCG/ACT AERS Inhale 2 puffs into the lungs at bedtime. Spiriva      tiZANidine   (ZANAFLEX ) 4 MG capsule Take 1 capsule (4 mg total) by mouth 3 (three) times daily. This can make you sleepy. 60 capsule 0   traMADol  (ULTRAM ) 50 MG tablet Take 1 tablet (50 mg total) by mouth 2 (two) times daily. 60 tablet 0   No current facility-administered medications for this visit.   Facility-Administered Medications Ordered in Other Visits  Medication Dose Route Frequency Provider Last Rate Last Admin   potassium chloride  10 mEq in 100 mL IVPB  10 mEq Intravenous Once Borders, Fonda SAUNDERS, NP        PHYSICAL EXAMINATION: ECOG PERFORMANCE STATUS: 1 - Symptomatic but completely ambulatory  BP (!) 106/47 (BP Location: Left Arm, Patient Position: Sitting, Cuff Size: Large)   Pulse 67   Temp 98.4 F (36.9 C) (Tympanic)   Resp 18   Ht 5' 8 (1.727 m)   Wt 203 lb (92.1 kg)   SpO2 99%   BMI 30.87 kg/m   Filed Weights   04/13/24 1320  Weight: 203 lb (92.1 kg)    Physical Exam HENT:     Head: Normocephalic and atraumatic.     Mouth/Throat:     Pharynx: No oropharyngeal exudate.  Eyes:     Pupils: Pupils are equal, round, and reactive to light.  Cardiovascular:     Rate and Rhythm: Normal rate and regular rhythm.  Pulmonary:     Effort: No respiratory distress.     Breath sounds: No wheezing.  Abdominal:     General: Bowel sounds are normal. There is no distension.     Palpations: Abdomen is soft. There is no mass.     Tenderness: There is no abdominal tenderness. There is no guarding or rebound.  Musculoskeletal:        General: No tenderness. Normal range of motion.     Cervical back: Normal range of  motion and neck supple.  Skin:    General: Skin is warm.  Neurological:     Mental Status: He is alert and oriented to person, place, and time.  Psychiatric:        Mood and Affect: Affect normal.     LABORATORY DATA:  I have reviewed the data as listed    Component Value Date/Time   NA 138 04/13/2024 1314   NA 143 03/11/2021 1136   NA 139 10/11/2014 1800   K  4.3 04/13/2024 1314   K 3.3 (L) 10/11/2014 1800   CL 107 04/13/2024 1314   CL 106 10/11/2014 1800   CO2 23 04/13/2024 1314   CO2 27 10/11/2014 1800   GLUCOSE 112 (H) 04/13/2024 1314   GLUCOSE 107 (H) 10/11/2014 1800   BUN 24 (H) 04/13/2024 1314   BUN 13 03/11/2021 1136   BUN 15 10/11/2014 1800   CREATININE 0.95 04/13/2024 1314   CREATININE 0.89 10/11/2014 1800   CALCIUM  9.1 04/13/2024 1314   CALCIUM  8.8 (L) 10/11/2014 1800   PROT 6.7 04/13/2024 1314   PROT 6.7 05/18/2017 1048   PROT 6.4 (L) 10/11/2014 1800   ALBUMIN  4.0 04/13/2024 1314   ALBUMIN  4.3 05/18/2017 1048   ALBUMIN  4.1 10/11/2014 1800   AST 20 04/13/2024 1314   ALT 16 04/13/2024 1314   ALT 22 10/11/2014 1800   ALKPHOS 70 04/13/2024 1314   ALKPHOS 61 10/11/2014 1800   BILITOT 1.1 04/13/2024 1314   GFRNONAA >60 04/13/2024 1314   GFRNONAA >60 10/11/2014 1800   GFRAA >60 04/12/2020 0959   GFRAA >60 10/11/2014 1800    No results found for: SPEP, UPEP  Lab Results  Component Value Date   WBC 8.6 04/13/2024   NEUTROABS 4.5 04/13/2024   HGB 12.3 (L) 04/13/2024   HCT 36.5 (L) 04/13/2024   MCV 99.2 04/13/2024   PLT 143 (L) 04/13/2024      Chemistry      Component Value Date/Time   NA 138 04/13/2024 1314   NA 143 03/11/2021 1136   NA 139 10/11/2014 1800   K 4.3 04/13/2024 1314   K 3.3 (L) 10/11/2014 1800   CL 107 04/13/2024 1314   CL 106 10/11/2014 1800   CO2 23 04/13/2024 1314   CO2 27 10/11/2014 1800   BUN 24 (H) 04/13/2024 1314   BUN 13 03/11/2021 1136   BUN 15 10/11/2014 1800   CREATININE 0.95 04/13/2024 1314   CREATININE 0.89 10/11/2014 1800      Component Value Date/Time   CALCIUM  9.1 04/13/2024 1314   CALCIUM  8.8 (L) 10/11/2014 1800   ALKPHOS 70 04/13/2024 1314   ALKPHOS 61 10/11/2014 1800   AST 20 04/13/2024 1314   ALT 16 04/13/2024 1314   ALT 22 10/11/2014 1800   BILITOT 1.1 04/13/2024 1314       RADIOGRAPHIC STUDIES: I have personally reviewed the radiological images as listed  and agreed with the findings in the report. CUP PACEART INCLINIC DEVICE CHECK Result Date: 04/12/2024 Normal in-clinic _dual__ chamber pacemaker check. Presenting Rhythm: _AP-VS__ . Routine testing of thresholds, sensing, and impedance demonstrate stable parameters and no programming changes needed at this time. No episodes. Estimated longevity _2 years___ . Pt enrolled in remote follow-up. 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  Patient currently on Acalabrutnib [AUG 16th, 2023]-  Patient admits to compliance- SEP 16th, 2025- NECK- CT AP Significant interval improvement of retroperitoneal  lymphadenopathy, including decreased size of left retroperitoneal and right retrocaval nodes, and resolution of right external iliac lymphadenopathy.  # Clinically stable; no evidence of any progression - admit compliance. Continue acalabrutinib .   # Neck pain/Joint pains/ Muscle pain- s/p evaluation with  Dr.Chasnis/ Dr.Brandon Claudene; [facility]. S/p  surgery [AUG 2024]- stable .   # Easy bruising [posterior Legs]sec Eliquis  + Acalabrutinib - stable.  # Mild to moderate anemia:[>>> Colo] Continue gentle iron/slow iron.Hb- 12.3- improving-    stable   # Low immunoglobulins IgG 330/Hx  severe respiratory infection/CLL-s/pI IVIG #4/4 [last April 2022 -Will repeat immunoglobulins.  # COPD [Dr.Kasa]-chronic cough sputum.  No clinical evidence of any acute exacerbation at this time- stable   # A.fib-on amiodarone  [ Dr.Gollan] sinus rhythm-on Eliquis /CHF- lasix  40/d- s/p Cath [AUG, 2022- Echo-55%] stable   # Left sided CVA- [s/p rehab; April 2023]- stable  # Vaccination: s/p Flu shot [2024]; recommend COVID/RSV  26m # DISPOSITION: # Labs- add T4 and TSH today # follow up in 1st week of DEC 2025- MD; labs- cbc/cmp; LDH; immunoglobulins--Dr.B  # I reviewed the blood work- with the patient in detail; also reviewed  the imaging independently [as summarized above]; and with the patient in detail.        Orders Placed This Encounter  Procedures   T4, free    Standing Status:   Future    Number of Occurrences:   1    Expected Date:   04/13/2024    Expiration Date:   04/13/2025   TSH    Standing Status:   Future    Number of Occurrences:   1    Expected Date:   04/13/2024    Expiration Date:   04/13/2025   CBC with Differential (Cancer Center Only)    Standing Status:   Future    Expected Date:   06/22/2024    Expiration Date:   04/13/2025   CMP (Cancer Center only)    Standing Status:   Future    Expected Date:   06/22/2024    Expiration Date:   04/13/2025   Lactate dehydrogenase    Standing Status:   Future    Expected Date:   06/22/2024    Expiration Date:   04/13/2025   Immunoglobulins, QN, A/E/G/M    Standing Status:   Future    Expected Date:   06/22/2024    Expiration Date:   04/13/2025   All questions were answered. The patient knows to call the clinic with any problems, questions or concerns.     Cindy JONELLE Joe, MD 04/13/2024 2:23 PM

## 2024-04-13 NOTE — Assessment & Plan Note (Addendum)
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  Patient currently on Acalabrutnib [AUG 16th, 2023]-  Patient admits to compliance- SEP 16th, 2025- NECK- CT AP Significant interval improvement of retroperitoneal lymphadenopathy, including decreased size of left retroperitoneal and right retrocaval nodes, and resolution of right external iliac lymphadenopathy.  # Clinically stable; no evidence of any progression - admit compliance. Continue acalabrutinib .   # Neck pain/Joint pains/ Muscle pain- s/p evaluation with  Dr.Chasnis/ Dr.Brandon Claudene; [facility]. S/p  surgery [AUG 2024]- stable .   # Easy bruising [posterior Legs]sec Eliquis  + Acalabrutinib - stable.  # Mild to moderate anemia:[>>> Colo] Continue gentle iron/slow iron.Hb- 12.3- improving-    stable   # Low immunoglobulins IgG 330/Hx  severe respiratory infection/CLL-s/pI IVIG #4/4 [last April 2022 -Will repeat immunoglobulins.  # COPD [Dr.Kasa]-chronic cough sputum.  No clinical evidence of any acute exacerbation at this time- stable   # A.fib-on amiodarone  [ Dr.Gollan] sinus rhythm-on Eliquis /CHF- lasix  40/d- s/p Cath [AUG, 2022- Echo-55%] stable   # Left sided CVA- [s/p rehab; April 2023]- stable  # Vaccination: s/p Flu shot [2024]; recommend COVID/RSV  12m # DISPOSITION: # Labs- add T4 and TSH today # follow up in 1st week of DEC 2025- MD; labs- cbc/cmp; LDH; immunoglobulins--Dr.B  # I reviewed the blood work- with the patient in detail; also reviewed the imaging independently [as summarized above]; and with the patient in detail.

## 2024-04-16 ENCOUNTER — Ambulatory Visit: Payer: Self-pay | Admitting: Cardiology

## 2024-04-20 ENCOUNTER — Other Ambulatory Visit (HOSPITAL_COMMUNITY): Payer: Self-pay

## 2024-04-20 ENCOUNTER — Other Ambulatory Visit: Payer: Self-pay

## 2024-04-20 ENCOUNTER — Other Ambulatory Visit: Payer: Self-pay | Admitting: Internal Medicine

## 2024-04-20 DIAGNOSIS — C9110 Chronic lymphocytic leukemia of B-cell type not having achieved remission: Secondary | ICD-10-CM

## 2024-04-20 NOTE — Progress Notes (Signed)
 Specialty Pharmacy Refill Coordination Note  [name] is a 78 y.o. male contacted today regarding refills of specialty medication(s) Acalabrutinib  Maleate (Calquence )   Patient requested Delivery   Delivery date: 04/25/24   Verified address: [address]  [place] [address]   Medication will be filled on 10.03.25.   This fill date is pending response to refill request from provider. Patient is aware and if they have not received fill by intended date they must follow up with pharmacy.

## 2024-04-21 ENCOUNTER — Other Ambulatory Visit: Payer: Self-pay

## 2024-04-21 ENCOUNTER — Other Ambulatory Visit: Payer: Self-pay | Admitting: Internal Medicine

## 2024-04-21 ENCOUNTER — Other Ambulatory Visit (HOSPITAL_COMMUNITY): Payer: Self-pay

## 2024-04-21 DIAGNOSIS — C9110 Chronic lymphocytic leukemia of B-cell type not having achieved remission: Secondary | ICD-10-CM

## 2024-04-21 MED ORDER — CALQUENCE 100 MG PO TABS
100.0000 mg | ORAL_TABLET | Freq: Two times a day (BID) | ORAL | 2 refills | Status: DC
  Filled 2024-04-21: qty 60, 30d supply, fill #0
  Filled 2024-05-13 – 2024-05-16 (×2): qty 60, 30d supply, fill #1
  Filled 2024-06-20 – 2024-06-21 (×5): qty 60, 30d supply, fill #2

## 2024-04-22 ENCOUNTER — Other Ambulatory Visit: Payer: Self-pay | Admitting: Internal Medicine

## 2024-04-22 DIAGNOSIS — R1310 Dysphagia, unspecified: Secondary | ICD-10-CM

## 2024-04-27 NOTE — Progress Notes (Signed)
 Remote PPM Transmission

## 2024-04-28 ENCOUNTER — Ambulatory Visit
Admission: RE | Admit: 2024-04-28 | Discharge: 2024-04-28 | Disposition: A | Discharge status: Home / Self Care | Source: Ambulatory Visit | Attending: Internal Medicine | Admitting: Internal Medicine

## 2024-04-28 DIAGNOSIS — R1310 Dysphagia, unspecified: Secondary | ICD-10-CM | POA: Diagnosis present

## 2024-05-03 ENCOUNTER — Ambulatory Visit: Payer: Medicare HMO

## 2024-05-03 DIAGNOSIS — I495 Sick sinus syndrome: Secondary | ICD-10-CM

## 2024-05-04 LAB — CUP PACEART REMOTE DEVICE CHECK
Battery Remaining Longevity: 25 mo
Battery Voltage: 2.93 V
Brady Statistic AP VP Percent: 3.41 %
Brady Statistic AP VS Percent: 84.74 %
Brady Statistic AS VP Percent: 0.03 %
Brady Statistic AS VS Percent: 11.83 %
Brady Statistic RA Percent Paced: 88.16 %
Brady Statistic RV Percent Paced: 3.59 %
Date Time Interrogation Session: 20251014105257
Implantable Lead Connection Status: 753985
Implantable Lead Connection Status: 753985
Implantable Lead Implant Date: 20170814
Implantable Lead Implant Date: 20170814
Implantable Lead Location: 753859
Implantable Lead Location: 753860
Implantable Lead Model: 5076
Implantable Lead Model: 5076
Implantable Pulse Generator Implant Date: 20170814
Lead Channel Impedance Value: 342 Ohm
Lead Channel Impedance Value: 456 Ohm
Lead Channel Impedance Value: 475 Ohm
Lead Channel Impedance Value: 551 Ohm
Lead Channel Pacing Threshold Amplitude: 0.75 V
Lead Channel Pacing Threshold Amplitude: 0.875 V
Lead Channel Pacing Threshold Pulse Width: 0.4 ms
Lead Channel Pacing Threshold Pulse Width: 0.4 ms
Lead Channel Sensing Intrinsic Amplitude: 21.5 mV
Lead Channel Sensing Intrinsic Amplitude: 21.5 mV
Lead Channel Sensing Intrinsic Amplitude: 3.125 mV
Lead Channel Sensing Intrinsic Amplitude: 3.125 mV
Lead Channel Setting Pacing Amplitude: 2 V
Lead Channel Setting Pacing Amplitude: 2.5 V
Lead Channel Setting Pacing Pulse Width: 0.4 ms
Lead Channel Setting Sensing Sensitivity: 0.9 mV
Zone Setting Status: 755011
Zone Setting Status: 755011

## 2024-05-06 NOTE — Progress Notes (Signed)
 Remote PPM Transmission

## 2024-05-10 ENCOUNTER — Telehealth: Payer: Self-pay

## 2024-05-10 ENCOUNTER — Ambulatory Visit: Admitting: Internal Medicine

## 2024-05-10 ENCOUNTER — Ambulatory Visit (INDEPENDENT_AMBULATORY_CARE_PROVIDER_SITE_OTHER): Admitting: Nurse Practitioner

## 2024-05-10 ENCOUNTER — Ambulatory Visit (INDEPENDENT_AMBULATORY_CARE_PROVIDER_SITE_OTHER)

## 2024-05-10 ENCOUNTER — Encounter: Payer: Self-pay | Admitting: Nurse Practitioner

## 2024-05-10 VITALS — BP 132/70 | HR 71 | Temp 97.1°F | Ht 69.0 in | Wt 202.0 lb

## 2024-05-10 DIAGNOSIS — Z23 Encounter for immunization: Secondary | ICD-10-CM

## 2024-05-10 DIAGNOSIS — G4733 Obstructive sleep apnea (adult) (pediatric): Secondary | ICD-10-CM | POA: Diagnosis not present

## 2024-05-10 DIAGNOSIS — J432 Centrilobular emphysema: Secondary | ICD-10-CM

## 2024-05-10 DIAGNOSIS — J449 Chronic obstructive pulmonary disease, unspecified: Secondary | ICD-10-CM | POA: Diagnosis not present

## 2024-05-10 DIAGNOSIS — J4489 Other specified chronic obstructive pulmonary disease: Secondary | ICD-10-CM

## 2024-05-10 LAB — PULMONARY FUNCTION TEST
DL/VA % pred: 67 %
DL/VA: 2.6 ml/min/mmHg/L
DLCO unc % pred: 51 %
DLCO unc: 12.11 ml/min/mmHg
FEF 25-75 Pre: 0.59 L/s
FEF2575-%Pred-Pre: 29 %
FEV1-%Pred-Pre: 50 %
FEV1-Pre: 1.42 L
FEV1FVC-%Pred-Pre: 80 %
FEV6-%Pred-Pre: 65 %
FEV6-Pre: 2.4 L
FEV6FVC-%Pred-Pre: 104 %
FVC-%Pred-Pre: 62 %
FVC-Pre: 2.45 L
Pre FEV1/FVC ratio: 58 %
Pre FEV6/FVC Ratio: 98 %
RV % pred: 172 %
RV: 4.4 L
TLC % pred: 98 %
TLC: 6.75 L

## 2024-05-10 MED ORDER — OHTUVAYRE 3 MG/2.5ML IN SUSP
2.5000 mL | Freq: Two times a day (BID) | RESPIRATORY_TRACT | Status: AC
Start: 2024-05-10 — End: ?

## 2024-05-10 NOTE — Patient Instructions (Addendum)
 Continue Albuterol  inhaler 2 puffs or 3 mL neb every 6 hours as needed for shortness of breath or wheezing. Notify if symptoms persist despite rescue inhaler/neb use.  Increase Wixela (gray dry powder inhaler) to 1 puff Twice daily. Brush tongue and rinse mouth afterwards Use Spiriva  2 puffs daily in AM  -Start Ohtuvayre  2.5 mL nebulizer treatments Twice daily. This is going to be an additional maintenance inhaler that you will use twice daily, regardless of how you feel. Do not use this for emergencies; that is what your albuterol  and duonebs are for. Notify and stop if you develop any significant mood changes -Guaifenesin  (mucinex ) 600 mg Twice daily for congestion/cough  Continue to use CPAP every night, minimum of 4-6 hours a night.  Change equipment as directed. Wash your tubing with warm soap and water  daily, hang to dry. Wash humidifier portion weekly. Use bottled, distilled water  and change daily Be aware of reduced alertness and do not drive or operate heavy machinery if experiencing this or drowsiness.   Your lung function testing showed moderately severe COPD  Follow up in 6-8 weeks to see how Ohtuvayre  is working with Dr. Isaiah or Izetta Malachy PIETY. If symptoms do not improve or worsen, please contact office for sooner follow up or seek emergency care.

## 2024-05-10 NOTE — Assessment & Plan Note (Signed)
 OSA on CPAP. Excellent compliance and control. Receives benefit from use. Understands risks of untreated OSA. Safe driving practices reviewed.

## 2024-05-10 NOTE — Assessment & Plan Note (Addendum)
 Moderately severe COPD. Not utilizing Wixela properly. Advised on proper dosing/administration. Continue triple therapy regimen. No recent exacerbations or hospitalizations. Chronic bronchitis symptoms with ongoing DOE. Moderate to high symptom burden. Will add on Ohtuvayre  nebulized regimen. Side effect profile reviewed. Enrollment paperwork completed. Encouraged to work on graded exercises. Trigger prevention reviewed. Action plan in place. Flu vaccine today. Side effect profile reviewed.   Patient Instructions  Continue Albuterol  inhaler 2 puffs or 3 mL neb every 6 hours as needed for shortness of breath or wheezing. Notify if symptoms persist despite rescue inhaler/neb use.  Increase Wixela (gray dry powder inhaler) to 1 puff Twice daily. Brush tongue and rinse mouth afterwards Use Spiriva  2 puffs daily in AM  -Start Ohtuvayre  2.5 mL nebulizer treatments Twice daily. This is going to be an additional maintenance inhaler that you will use twice daily, regardless of how you feel. Do not use this for emergencies; that is what your albuterol  and duonebs are for. Notify and stop if you develop any significant mood changes -Guaifenesin  (mucinex ) 600 mg Twice daily for congestion/cough  Continue to use CPAP every night, minimum of 4-6 hours a night.  Change equipment as directed. Wash your tubing with warm soap and water  daily, hang to dry. Wash humidifier portion weekly. Use bottled, distilled water  and change daily Be aware of reduced alertness and do not drive or operate heavy machinery if experiencing this or drowsiness.   Your lung function testing showed moderately severe COPD  Follow up in 6-8 weeks to see how Ohtuvayre  is working with Dr. Isaiah or Izetta Malachy PIETY. If symptoms do not improve or worsen, please contact office for sooner follow up or seek emergency care.

## 2024-05-10 NOTE — Telephone Encounter (Signed)
 Patient was seen in the office today. Izetta Rouleau, NP has ordered Ohtuvayre  for the patient.  He has signed the form and it has been faxed to the pharmacy team for completion.

## 2024-05-10 NOTE — Progress Notes (Signed)
 @Patient  ID: [name], male    DOB: [birth year], 78 y.o.   MRN: [id number]  Chief Complaint  Patient presents with   COPD    PFT results. DOE. Cough with white sputum. Occasional wheezing.  Using Dulera  BID helps with his breathing. DuoNeb 2-3 times a day. Albuterol  3-4 times a day.   Sleep Apnea    CPAP is good. No problems    Referring provider: Gretel App, NP  HPI: 78 year old male, former smoker followed for COPD and OSA on CPAP. He is a patient of Dr. Jacqulyn and last seen in office 11/09/2023. Past medical history significant for SSS s/p ICD, PAF on Eliquis , HTN, CVA, CHF, cardiomyopathy, HFpEF, GERD, hearing loss, PTSD, HLD, CLL.   TEST/EVENTS:  07/25/2015 PSG: overall AHI 10/h but 100/hr in supine 08/2015 CPAP titration >> 8 cmH2O 04/05/2024 CT chest: mild streaky atelectasis/scarring in RLL and lingula. No suspicious nodules/masses  05/10/2024 PFT: FVC 62, FEV1 50, ratio 58, TLC 98, DLCO 51 corrects to 67 for alveolar volume. Post bronchodilator not completed as pt had used albuterol  and duoneb 40 minutes prior to test. Moderately severe OSA with moderate diffusion defect   11/09/2023: OV with Dr. Isaiah. OSA on CPAP. Using nightly. Pressure is comfortable and sleeping well. Residual AHI 2.2. PFT ordered to evaluate for restrictive lung disease given amiodarone  use. Uses albuterol  intermittently. No recent exacerbation of COPD. Using Advair.   05/10/2024: Today - follow up Discussed the use of AI scribe software for clinical note transcription with the patient, who gave verbal consent to proceed.  History of Present Illness [name] is a 78 year old male with severe COPD who presents for follow up after PFT, which revealed moderately severe COPD without any restrictive defect.   He has a history of COPD and experiences chronic symptoms of shortness of breath. He currently uses multiple inhalers provided by the [facility]. He uses a dry powder inhaler, identified as Wixela,  once every evening. Spiriva  is used once every evening. Additionally, he uses albuterol  three times a day when he feels short of breath.   He uses a breathing treatment machine twice daily, which helps to alleviate chest congestion and expel phlegm, which is typically white in color and chronic for him. No fevers, chills, or hemoptysis are reported. He can't hear any wheezing but is also hard of hearing. No leg swelling, orthopnea, PND, CP. He does not currently take Mucinex . No exacerbations requiring steroids or abx. Uses an electric scooter to aid in transportation when going longer distances. Uses a cane for shorter distances. No hospitalizations.   In terms of social history, he is active with hobbies such as woodworking and caring for animals, including chickens and goats. He wears a respirator when woodworking.   He wears his CPAP every night. Couldn't sleep without it. Feels sleep is refreshing. Does have some baseline daytime fatigue. No drowsy driving.   0/79/7974-89/70/7974: CPAP 8 cmH2O 30/30 days; 97% >4 hr; average use 7 hr 51 min Leaks 95th 45 AHI 3    Allergies  Allergen Reactions   Atorvastatin  Other (See Comments)    Muscle aching   Augmentin  [Amoxicillin -Pot Clavulanate]     C diff    Immunization History  Administered Date(s) Administered    sv, Bivalent, Protein Subunit Rsvpref,pf Marlow) 02/23/2023   Fluad Quad(high Dose 65+) 04/04/2020, 05/02/2021, 04/07/2022   Fluad Trivalent(High Dose 65+) 04/15/2018, 04/10/2023   Hep B, Unspecified 01/09/2003, 05/16/2003   Hepatitis B,  ADULT 03/14/2004   INFLUENZA, HIGH DOSE SEASONAL PF 04/21/2018, 05/10/2024   Influenza Split 05/21/2012   Influenza, Seasonal, Injecte, Preservative Fre 04/23/2011, 03/31/2012, 04/06/2013   Influenza,inj,Quad PF,6+ Mos 03/16/2014, 06/20/2015, 04/27/2016, 04/17/2017   Influenza-Unspecified 08/06/2015, 05/12/2016, 04/15/2018   PFIZER(Purple Top)SARS-COV-2 Vaccination 08/22/2019, 09/15/2019,  10/11/2019, 04/17/2020   Pfizer Covid-19 Vaccine Bivalent Booster 73yrs & up 05/17/2021   Pneumococcal Conjugate-13 04/07/2014, 07/05/2015   Pneumococcal Polysaccharide-23 07/21/2008, 01/10/2009, 01/07/2011, 04/21/2015, 08/06/2015   Tdap 03/28/2013, 01/14/2018, 05/07/2021   Zoster Recombinant(Shingrix) 02/24/2022, 08/25/2022    Past Medical History:  Diagnosis Date   Agent orange exposure    Anxiety    Arthritis    Atrial fibrillation (HCC) 2013   a.) Dx'd in 2013; recurrence 02/2014; b.) CHA2DS2-VASc = 7 (age x2, HFimpEF, HTN, CVA x 2, previous MI); c.) cardiac rate/rhythm maintained on oral amiodarone ; chronically anticoagulated using apixaban    BPH (benign prostatic hyperplasia)    C. 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)    a.) MV 08/23/2015: EF 50%; b.) TTE 08/23/2015: EF 55-60%, G1DD; c.) TTE 11/02/2015: EF 55-60%, mild LA dil; d.) TTE 02/04/2020: EF 55-60%, mild LVH, G1DD, PASP 44.3, mild-mod AoV sclerosis; e.) MV 12/25/2020: EF 45-54%; f.)  R/LHC 02/04/2021: mRA 13, mPA 27, mPCWP 22, LVEDP 23, Ao sat 99, PA sat 70, CO 6.5, CI 3.0; g.) TTE 02/19/2021: EF 55-60%, G1DD, mildly red RVSF, triv MR, mild-mod AoV scler   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 EF 55%, mild antlat HK;  c.) s/p R/LHC 02/04/2021: LM mild diff dzs, LAD 100p, LCX nl, RCA 50p, 95d, 99 side branch, RPAV fills via LCx.  SVG->RPDA 100, SVG->OM1 100, LIMA->LAD patent. EF 35-45%.   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    Hyperlipidemia LDL goal <70    Hypertension    Hypokalemia    Ischemic cardiomyopathy    a.) MV 08/23/2015: EF 50%; b.) TTE 08/23/2015: EF 55-60%; c.) TTE 11/02/2015: EF 55-60%; d.) TTE 02/04/2020: EF 55-60%; e.) MV 12/25/2020: EF 45-54%; f.) R/LHC 02/04/2021: mRA 13, mPA 27, mPCWP 22, LVEDP 23, Ao sat 99, PA sat 70, CO 6.5, CI 3.0; g.) TTE 02/19/2021: EF 55-60%   Ischemic cerebrovascular accident (CVA) (HCC) 10/18/2021   Myocardial infarction (HCC) 2010   On amiodarone  therapy    On apixaban  therapy    OSA on CPAP    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 60 tablet 2   acetaminophen  (TYLENOL ) 325 MG tablet Take 1-2 tablets (325-650 mg total) by mouth every 4 (four) hours as needed for mild pain.     albuterol  (VENTOLIN  HFA) 108 (90 Base) MCG/ACT inhaler Inhale 2 puffs into the lungs every 6 (six) hours as needed for wheezing or shortness of breath. 1 each 0   amiodarone  (PACERONE ) 200 MG tablet TAKE 1/2 TABLET BY MOUTH TWICE DAILY FOR 5 DAYS A WEEK 75 tablet 3   apixaban  (ELIQUIS ) 5 MG TABS tablet Take 1 tablet (5 mg total) by mouth 2 (two) times daily. 90 tablet 3   Apple Cider Vinegar 500 MG TABS Take 1 tablet by mouth daily.     azelastine  (ASTELIN ) 0.1 % nasal spray Place 2 sprays into both nostrils 2 (two) times daily. Use in each nostril as directed 30 mL 1   cetirizine (ZYRTEC) 10 MG tablet Take 10 mg by mouth daily as needed for allergies.      Cholecalciferol (VITAMIN D3) 250 MCG (10000 UT) capsule Take 10,000 Units by mouth daily.     Coenzyme Q10 (COQ10) 200 MG CAPS Take 200 mg by mouth daily.     cyanocobalamin  (VITAMIN B12) 1000 MCG tablet Take 1,000 mcg by mouth daily.     ezetimibe  (ZETIA ) 10 MG tablet Take 1 tablet (10 mg total) by mouth daily. 90 tablet 3   ferrous sulfate  325 (65 FE) MG EC tablet Take 325 mg by mouth daily.     ipratropium-albuterol  (DUONEB) 0.5-2.5 (3) MG/3ML SOLN Take 3 mLs by nebulization every 6 (six) hours as needed (shorntess of breath). 360 mL 0   mirtazapine  (REMERON ) 15 MG tablet Take 15 mg by mouth at bedtime as needed (for panic associated with PTSD).     Multiple Vitamin (MULTIVITAMIN WITH MINERALS) TABS tablet Take 1 tablet by mouth daily.     pantoprazole  (PROTONIX ) 40 MG tablet TAKE 1 TABLET(40 MG) BY MOUTH DAILY 90 tablet 3   REPATHA  SURECLICK 140 MG/ML SOAJ ADMINISTER 1 ML UNDER THE SKIN EVERY 14 DAYS 2 mL 11   tamsulosin  (FLOMAX ) 0.4 MG CAPS capsule TAKE 1 CAPSULE(0.4 MG)  BY MOUTH DAILY 30 capsule 3   Tiotropium Bromide  Monohydrate 2.5 MCG/ACT AERS Inhale 2 puffs into the lungs at bedtime. Spiriva      tiZANidine  (ZANAFLEX ) 4 MG capsule Take 1 capsule (4 mg total) by mouth 3 (three) times daily. This can make you sleepy. 60 capsule 0   traMADol  (ULTRAM ) 50 MG tablet Take  1 tablet (50 mg total) by mouth 2 (two) times daily. 60 tablet 0   mometasone -formoterol  (DULERA ) 200-5 MCG/ACT AERO Inhale 2 puffs into the lungs 2 (two) times daily.     fluticasone -salmeterol (WIXELA INHUB) 100-50 MCG/ACT AEPB Inhale 2 puffs into the lungs 2 (two) times daily. (Patient not taking: Reported on 05/10/2024)     Facility-Administered Medications Prior to Visit  Medication Dose Route Frequency Provider Last Rate Last Admin   potassium chloride  10 mEq in 100 mL IVPB  10 mEq Intravenous Once Borders, Fonda SAUNDERS, NP         Review of Systems: as above    Physical Exam:  BP 132/70 Comment: left arm  Pulse 71   Temp (!) 97.1 F (36.2 C)   Ht 5' 9 (1.753 m)   Wt 202 lb (91.6 kg)   SpO2 98%   BMI 29.83 kg/m   GEN: Pleasant, interactive, well-kempt; elderly; in no acute distress HEENT:  Normocephalic and atraumatic. PERRLA. Sclera white. Nasal turbinates pink, moist and patent bilaterally. No rhinorrhea present. Oropharynx pink and moist, without exudate or edema. No lesions, ulcerations, or postnasal drip.  NECK:  Supple w/ fair ROM. No JVD present. No lymphadenopathy.   CV: RRR, no m/r/g, no peripheral edema. Pulses intact, +2 bilaterally. No cyanosis, pallor or clubbing. PULMONARY:  Unlabored, regular breathing. Diminished bibasilar airflow otherwise clear bilaterally A&P w/o wheezes/rales/rhonchi. No accessory muscle use.  GI: BS present and normoactive. Soft, non-tender to palpation.  MSK: No erythema, warmth or tenderness.  Neuro: A/Ox3. No focal deficits noted.   Skin: Warm, no lesions or rashe Psych: Normal affect and behavior. Judgement and thought content  appropriate.     Lab Results:  CBC    Component Value Date/Time   WBC 8.6 04/13/2024 1314   WBC 7.6 02/08/2024 1703   RBC 3.68 (L) 04/13/2024 1314   HGB 12.3 (L) 04/13/2024 1314   HGB 12.5 (L) 03/11/2021 1136   HCT 36.5 (L) 04/13/2024 1314   HCT 36.9 (L) 03/11/2021 1136   PLT 143 (L) 04/13/2024 1314   PLT 159 03/11/2021 1136   MCV 99.2 04/13/2024 1314   MCV 100 (H) 03/11/2021 1136   MCV 95 10/24/2014 0837   MCH 33.4 04/13/2024 1314   MCHC 33.7 04/13/2024 1314   RDW 14.5 04/13/2024 1314   RDW 13.5 03/11/2021 1136   RDW 14.6 (H) 10/24/2014 0837   LYMPHSABS 3.4 04/13/2024 1314   LYMPHSABS 0.8 (L) 10/24/2014 0837   MONOABS 0.6 04/13/2024 1314   MONOABS 0.6 10/24/2014 0837   EOSABS 0.1 04/13/2024 1314   EOSABS 0.2 10/24/2014 0837   BASOSABS 0.0 04/13/2024 1314   BASOSABS 0.0 10/24/2014 0837    BMET    Component Value Date/Time   NA 138 04/13/2024 1314   NA 143 03/11/2021 1136   NA 139 10/11/2014 1800   K 4.3 04/13/2024 1314   K 3.3 (L) 10/11/2014 1800   CL 107 04/13/2024 1314   CL 106 10/11/2014 1800   CO2 23 04/13/2024 1314   CO2 27 10/11/2014 1800   GLUCOSE 112 (H) 04/13/2024 1314   GLUCOSE 107 (H) 10/11/2014 1800   BUN 24 (H) 04/13/2024 1314   BUN 13 03/11/2021 1136   BUN 15 10/11/2014 1800   CREATININE 0.95 04/13/2024 1314   CREATININE 0.89 10/11/2014 1800   CALCIUM  9.1 04/13/2024 1314   CALCIUM  8.8 (L) 10/11/2014 1800   GFRNONAA >60 04/13/2024 1314   GFRNONAA >60 10/11/2014 1800   GFRAA >60 04/12/2020  9040   GFRAA >60 10/11/2014 1800    BNP    Component Value Date/Time   BNP 47.0 09/13/2021 1546   BNP 47.3 02/18/2011 1318     Imaging:  CUP PACEART REMOTE DEVICE CHECK Result Date: 05/04/2024 Pacemaker: Scheduled remote reviewed. Normal device function.  Presenting rhythm: AP/VS Next remote transmission per protocol. ML, CVRS  DG ESOPHAGUS W SINGLE CM (SOL OR THIN BA) Result Date: 04/28/2024 CLINICAL DATA:  Provided history: Dysphagia,  unspecified type. EXAM: ESOPHOGRAM/BARIUM SWALLOW TECHNIQUE: A single contrast examination was performed using thin barium. Additionally, the patient swallowed a 13 mm barium tablet under fluoroscopy. FLUOROSCOPY: Radiation Exposure Index (as provided by the fluoroscopic device): 31.30 mGy Kerma COMPARISON:  CT chest/abdomen/pelvis 04/05/2024. Neck CT 04/05/2024. FINDINGS: Postsurgical changes to the cervical spine. Prior median sternotomy. Left chest multi-lead implantable cardiac device. Fluoroscopic evaluation demonstrates normal caliber and smooth contour of the esophagus. No evidence of a fixed stricture, mass or mucosal abnormality on this single contrast examination. Normal esophageal motility observed. No appreciable hiatal hernia. No gastroesophageal reflux observed. The patient swallowed a 13 mm barium tablet, which traversed the esophagus and passed into the stomach without significant delay. IMPRESSION: Unremarkable single contrast esophagram as described. Electronically Signed   By: Rockey Childs D.O.   On: 04/28/2024 10:30   CUP PACEART INCLINIC DEVICE CHECK Result Date: 04/12/2024 Normal in-clinic _dual__ chamber pacemaker check. Presenting Rhythm: _AP-VS__ . Routine testing of thresholds, sensing, and impedance demonstrate stable parameters and no programming changes needed at this time. No episodes. Estimated longevity _2 years___ . Pt enrolled in remote follow-up. CANDIE Needle, NP   Administration History     None          Latest Ref Rng & Units 05/10/2024    9:07 AM 10/03/2020    1:50 PM 07/27/2015    2:46 PM  PFT Results  FVC-Pre L 2.45  P  3.18  P  FVC-Predicted Pre % 62  P 68  74  P  FVC-Post L  2.86  2.90  P  FVC-Predicted Post %  70  68  P  Pre FEV1/FVC % % 58  P 60  70  P  Post FEV1/FCV % %  66  65  P  FEV1-Pre L 1.42  P 1.67  2.21  P  FEV1-Predicted Pre % 50  P 56  70  P  FEV1-Post L  1.90  1.89  P  DLCO uncorrected ml/min/mmHg 12.11  P 11.76  14.25  P  DLCO UNC% % 51   P 48  45  P  DLVA Predicted % 67  P 54  68  P  TLC L 6.75  P 5.81    TLC % Predicted % 98  P 84    RV % Predicted % 172  P 109      P Preliminary result    No results found for: NITRICOXIDE      Assessment & Plan:   COPD (chronic obstructive pulmonary disease) (HCC) Moderately severe COPD. Not utilizing Wixela properly. Advised on proper dosing/administration. Continue triple therapy regimen. No recent exacerbations or hospitalizations. Chronic bronchitis symptoms with ongoing DOE. Moderate to high symptom burden. Will add on Ohtuvayre  nebulized regimen. Side effect profile reviewed. Enrollment paperwork completed. Encouraged to work on graded exercises. Trigger prevention reviewed. Action plan in place. Flu vaccine today. Side effect profile reviewed.   Patient Instructions  Continue Albuterol  inhaler 2 puffs or 3 mL neb every 6 hours as needed for shortness  of breath or wheezing. Notify if symptoms persist despite rescue inhaler/neb use.  Increase Wixela (gray dry powder inhaler) to 1 puff Twice daily. Brush tongue and rinse mouth afterwards Use Spiriva  2 puffs daily in AM  -Start Ohtuvayre  2.5 mL nebulizer treatments Twice daily. This is going to be an additional maintenance inhaler that you will use twice daily, regardless of how you feel. Do not use this for emergencies; that is what your albuterol  and duonebs are for. Notify and stop if you develop any significant mood changes -Guaifenesin  (mucinex ) 600 mg Twice daily for congestion/cough  Continue to use CPAP every night, minimum of 4-6 hours a night.  Change equipment as directed. Wash your tubing with warm soap and water  daily, hang to dry. Wash humidifier portion weekly. Use bottled, distilled water  and change daily Be aware of reduced alertness and do not drive or operate heavy machinery if experiencing this or drowsiness.   Your lung function testing showed moderately severe COPD  Follow up in 6-8 weeks to see how  Ohtuvayre  is working with Dr. Isaiah or Izetta Malachy PIETY. If symptoms do not improve or worsen, please contact office for sooner follow up or seek emergency care.     OSA on CPAP OSA on CPAP. Excellent compliance and control. Receives benefit from use. Understands risks of untreated OSA. Safe driving practices reviewed.    Advised if symptoms do not improve or worsen, to please contact office for sooner follow up or seek emergency care.   I spent 35 minutes of dedicated to the care of this patient on the date of this encounter to include pre-visit review of records, face-to-face time with the patient discussing conditions above, post visit ordering of testing, clinical documentation with the electronic health record, making appropriate referrals as documented, and communicating necessary findings to members of the patients care team.  Comer LULLA Malachy, NP 05/10/2024  Pt aware and understands NP's role.

## 2024-05-10 NOTE — Progress Notes (Signed)
 Full PFT completed without post.

## 2024-05-10 NOTE — Patient Instructions (Signed)
 Full PFT completed without post.

## 2024-05-12 ENCOUNTER — Telehealth: Payer: Self-pay

## 2024-05-12 NOTE — Telephone Encounter (Signed)
 Received Ohtuvayre  [place]. Completed form and faxed with med list and clinicals to [facility]. Will update when we receive a response.  Phone #: [phone] Fax #: [phone]

## 2024-05-13 ENCOUNTER — Other Ambulatory Visit (HOSPITAL_COMMUNITY): Payer: Self-pay

## 2024-05-13 NOTE — Telephone Encounter (Signed)
 The pharmacy team has received the form and opened a separate encounter.  Nothing further needed.

## 2024-05-14 ENCOUNTER — Ambulatory Visit: Payer: Self-pay | Admitting: Cardiology

## 2024-05-16 ENCOUNTER — Other Ambulatory Visit (HOSPITAL_COMMUNITY): Payer: Self-pay

## 2024-05-16 ENCOUNTER — Other Ambulatory Visit: Payer: Self-pay

## 2024-05-16 NOTE — Progress Notes (Signed)
 Specialty Pharmacy Refill Coordination Note  ONIX JUMPER is a 78 y.o. male contacted today regarding refills of specialty medication(s) Acalabrutinib  Maleate (Calquence )   Patient requested Delivery   Delivery date: 05/20/24   Verified address: [address]  [place] [address]   Medication will be filled on: 05/19/24

## 2024-05-18 ENCOUNTER — Other Ambulatory Visit (HOSPITAL_COMMUNITY): Payer: Self-pay

## 2024-05-18 ENCOUNTER — Other Ambulatory Visit: Payer: Self-pay

## 2024-05-18 NOTE — Telephone Encounter (Signed)
 Called the [facility] to check on the status of rx. Spoke to a pensions consultant who stated the pt does not have an active referral for the pulmonology clinic. She stated the patient will have to call [phone]. [phone] to request a consult to be referred to the clinic. Spoke to the pt to inform him of above, advised we could also attempt to go through his Medicare but this may be the easiest way for him to get the medication for no charge. He stated he will call the number and request the consult. Gave him my direct line if he has any questions or updates.

## 2024-05-19 ENCOUNTER — Other Ambulatory Visit (HOSPITAL_COMMUNITY): Payer: Self-pay

## 2024-05-19 NOTE — Telephone Encounter (Signed)
 Received a call from the pt who states the paperwork to get a [facility] referral for our clinic was sent on 10/24. I see no record of the paperwork. Decided to pursue coverage under his [facility], per test claim a 30 day supply has a copay of $10. ReceiveOhtuvayre  new start paperwork. Completed form and faxed with clinicals and insurance card copy to [facility]   Phone#: [phone] Fax#: [phone]

## 2024-05-20 ENCOUNTER — Encounter: Payer: Self-pay | Admitting: Internal Medicine

## 2024-05-27 ENCOUNTER — Telehealth: Payer: Self-pay | Admitting: Pharmacy Technician

## 2024-05-27 NOTE — Telephone Encounter (Signed)
 Oral Oncology Patient Advocate Encounter  Was successful in securing patient a $8000 grant from [facility] to provide copayment coverage for Calquence .  This will keep the out of pocket expense at $0.     [employer] ID: [id number]   The billing information is as follows and has been shared with [facility].    RxBin: [id number] PCN: [id number] Member ID: [id number] Group ID: [id number] Dates of Eligibility: 04/27/2024 through 04/26/2025  Fund:  Chronic Lymphocytic Leukemia  [name] (Patty) Chet Burnet, CPhT  [facility], Zelda [place]mon, [facility] - Oral Chemotherapy Patient Advocate Specialist III Phone: [phone]  Fax: [phone]

## 2024-05-27 NOTE — Telephone Encounter (Signed)
 Received fax from [facility] that enrollment form was received.   Patient ID: [id number]

## 2024-05-30 ENCOUNTER — Ambulatory Visit: Payer: Self-pay | Admitting: Internal Medicine

## 2024-05-30 NOTE — Telephone Encounter (Addendum)
 Received call from the pt stating he received his medication today. Believe it was dispensed from [facility].  Pharmacy phone #: [phone]

## 2024-05-30 NOTE — Telephone Encounter (Signed)
 Received fax from [employer] with summary of benefits. Referral form for Ohtuv[facility]re  received. Psychiatrist DME Specialty Pharmacy: N/A. Unclear what pharmacy Ohtuv[facility]re  will be sent to.  If medication is unaffordable, patient will need to express financial hardship to be referred back to [facility] for patient assistance program pre-screening.   Patient ID: [id number] [facility] Phone#: [phone]  Will likely need to call [facility] to clarify details on where the Rx will be filled.

## 2024-06-07 ENCOUNTER — Other Ambulatory Visit: Payer: Self-pay

## 2024-06-13 ENCOUNTER — Other Ambulatory Visit (HOSPITAL_COMMUNITY): Payer: Self-pay

## 2024-06-20 ENCOUNTER — Inpatient Hospital Stay: Attending: Internal Medicine

## 2024-06-20 ENCOUNTER — Other Ambulatory Visit: Payer: Self-pay

## 2024-06-20 ENCOUNTER — Encounter: Payer: Self-pay | Admitting: Internal Medicine

## 2024-06-20 ENCOUNTER — Inpatient Hospital Stay: Admitting: Internal Medicine

## 2024-06-20 DIAGNOSIS — Z79899 Other long term (current) drug therapy: Secondary | ICD-10-CM | POA: Diagnosis not present

## 2024-06-20 DIAGNOSIS — Z87891 Personal history of nicotine dependence: Secondary | ICD-10-CM | POA: Diagnosis not present

## 2024-06-20 DIAGNOSIS — I4891 Unspecified atrial fibrillation: Secondary | ICD-10-CM | POA: Diagnosis not present

## 2024-06-20 DIAGNOSIS — C9112 Chronic lymphocytic leukemia of B-cell type in relapse: Secondary | ICD-10-CM | POA: Insufficient documentation

## 2024-06-20 DIAGNOSIS — D649 Anemia, unspecified: Secondary | ICD-10-CM | POA: Insufficient documentation

## 2024-06-20 DIAGNOSIS — Z7901 Long term (current) use of anticoagulants: Secondary | ICD-10-CM | POA: Insufficient documentation

## 2024-06-20 DIAGNOSIS — C9110 Chronic lymphocytic leukemia of B-cell type not having achieved remission: Secondary | ICD-10-CM

## 2024-06-20 DIAGNOSIS — Z8673 Personal history of transient ischemic attack (TIA), and cerebral infarction without residual deficits: Secondary | ICD-10-CM | POA: Diagnosis not present

## 2024-06-20 DIAGNOSIS — D803 Selective deficiency of immunoglobulin G [IgG] subclasses: Secondary | ICD-10-CM | POA: Diagnosis not present

## 2024-06-20 DIAGNOSIS — J449 Chronic obstructive pulmonary disease, unspecified: Secondary | ICD-10-CM | POA: Diagnosis not present

## 2024-06-20 LAB — LACTATE DEHYDROGENASE: LDH: 144 U/L (ref 105–235)

## 2024-06-20 LAB — CBC WITH DIFFERENTIAL (CANCER CENTER ONLY)
Abs Immature Granulocytes: 0.02 K/uL (ref 0.00–0.07)
Basophils Absolute: 0 K/uL (ref 0.0–0.1)
Basophils Relative: 1 %
Eosinophils Absolute: 0.1 K/uL (ref 0.0–0.5)
Eosinophils Relative: 2 %
HCT: 37.1 % — ABNORMAL LOW (ref 39.0–52.0)
Hemoglobin: 12.1 g/dL — ABNORMAL LOW (ref 13.0–17.0)
Immature Granulocytes: 0 %
Lymphocytes Relative: 37 %
Lymphs Abs: 2.4 K/uL (ref 0.7–4.0)
MCH: 33.2 pg (ref 26.0–34.0)
MCHC: 32.6 g/dL (ref 30.0–36.0)
MCV: 101.6 fL — ABNORMAL HIGH (ref 80.0–100.0)
Monocytes Absolute: 0.5 K/uL (ref 0.1–1.0)
Monocytes Relative: 7 %
Neutro Abs: 3.4 K/uL (ref 1.7–7.7)
Neutrophils Relative %: 53 %
Platelet Count: 146 K/uL — ABNORMAL LOW (ref 150–400)
RBC: 3.65 MIL/uL — ABNORMAL LOW (ref 4.22–5.81)
RDW: 14.6 % (ref 11.5–15.5)
WBC Count: 6.4 K/uL (ref 4.0–10.5)
nRBC: 0 % (ref 0.0–0.2)

## 2024-06-20 LAB — CMP (CANCER CENTER ONLY)
ALT: 16 U/L (ref 0–44)
AST: 19 U/L (ref 15–41)
Albumin: 4 g/dL (ref 3.5–5.0)
Alkaline Phosphatase: 74 U/L (ref 38–126)
Anion gap: 9 (ref 5–15)
BUN: 17 mg/dL (ref 8–23)
CO2: 26 mmol/L (ref 22–32)
Calcium: 8.9 mg/dL (ref 8.9–10.3)
Chloride: 108 mmol/L (ref 98–111)
Creatinine: 0.99 mg/dL (ref 0.61–1.24)
GFR, Estimated: >60 mL/min (ref >60)
Glucose, Bld: 101 mg/dL — ABNORMAL HIGH (ref 70–99)
Potassium: 4.2 mmol/L (ref 3.5–5.1)
Sodium: 143 mmol/L (ref 135–145)
Total Bilirubin: 0.8 mg/dL (ref 0.0–1.2)
Total Protein: 6.7 g/dL (ref 6.5–8.1)

## 2024-06-20 NOTE — Progress Notes (Signed)
 [facility] OFFICE PROGRESS NOTE  Patient Care Team: Gretel App, NP as PCP - General (Nurse Practitioner) Perla Evalene PARAS, MD as PCP - Cardiology (Cardiology) Kennyth Chew, MD as PCP - Electrophysiology (Clinical Cardiac Electrophysiology) Perla Evalene PARAS, MD as Consulting Physician (Cardiology) Rennie Cindy SAUNDERS, MD as Medical Oncologist (Hematology and Oncology) Bula Powell PARAS, RN as Registered Nurse (Oncology) Riddle, Suzann, NP as Nurse Practitioner (Clinical Cardiac Electrophysiology)   Cancer Staging  No matching staging information was found for the patient.    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  MARCH 2017- CT N/C/A/P- NED. STOP Ritux; surveillance   # AUG 2019- CT/PET- progression/NO transformation;  # NOV 2019- Progression; started Ibrutinib  420 mg/d. STOPPED in end of feb sec to extreme fatigue/joint pains/cramps  #November 01, 2018-start ibrutinib  280 mg a day; December 20, 2018-discontinue ibrutinib  secondary multiple side effects.   #January 03, 2019 start Gazyva  + Ven [July]; finished Dec 2020-; started Ven maintenance [200 mg a day; DI-amiodarone ]; SEP 2022- congestive heart failure; s/p cardiac cath-noted to have CAD however no stents placed.  2D echo- EF-55%  #Mid March 2021-venetoclax  dose reduced to 100 mg [second diarrhea]. HELD in DEC 2022/JAN 2023 x3 months-multiple admissions to the hospital/pneumonia infection stroke x 3 m.   # MAY 2023-progression of disease in the neck ches; Nov 18, 2021-RE-started venetoclax ;   # JUNE 29th, 2023-progression of disease in the neck- STOP VENAOCLAX;   # AUG 16th, 2023 START ACALABRUTINIB  100 mg BID [previous intolerance to ibrutinib ; delayed sec to C.diff]    #? 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 Ambulating with cane.    [name] 78 y.o.  male CLL-high risk of recurrent/relapsed currently on  alcabrutinib; afib on eliquis , COPD-  is here for a follow up.  Discussed the use of AI scribe software for clinical note transcription with the patient, who gave verbal consent to proceed.  History of Present Illness   [name] is a 78 year old male who presents for a follow-up visit.  He uses a nebulizer obtained from the lung doctor and finds it helpful. He continues to take his medications, including a chemotherapy pill and a blood thinner, twice daily without missing doses. He receives a call when he has about twenty pills left to ensure he has them when needed.  He has not experienced any recent episodes of pneumonia, which he describes as a terrible experience previously. No new lumps or bumps have been noticed.  He experiences bruising, which he attributes to the chemotherapy pill and blood thinner. A significant bruise extended from one area to another, now shrinking, resulted from a fall about a month ago during which he hit his head. He mentions a previous  head injury from almost a year ago that has not yet healed.  He is concerned about falling, stating he stumbles over his own feet. He mentions a past incident where he almost had an ear cut off.      Review of Systems   Constitutional:  Positive for malaise/fatigue. Negative for chills, diaphoresis and fever.  HENT:  Negative for nosebleeds and sore throat.   Eyes:  Negative for double vision.  Respiratory:  Negative for hemoptysis, shortness of breath and wheezing.   Cardiovascular:  Negative for chest pain, palpitations, orthopnea and leg swelling.  Gastrointestinal:  Negative for abdominal pain, blood in stool, constipation, heartburn, melena, nausea and vomiting.  Genitourinary:  Negative for dysuria, frequency and urgency.  Musculoskeletal:  Positive for back pain, joint pain and myalgias.  Skin: Negative.  Negative for itching and rash.  Neurological:  Negative for dizziness, tingling, focal weakness and headaches.  Psychiatric/Behavioral:  Negative for depression. The patient is not nervous/anxious and does not have insomnia.    PAST MEDICAL HISTORY :  Past Medical History:  Diagnosis Date   Agent orange exposure    Anxiety    Arthritis    Atrial fibrillation (HCC) 2013   a.) Dx'd in 2013; recurrence 02/2014; b.) CHA2DS2-VASc = 7 (age x2, HFimpEF, HTN, CVA x 2, previous MI); c.) cardiac rate/rhythm maintained on oral amiodarone ; chronically anticoagulated using apixaban    BPH (benign prostatic hyperplasia)    C. 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)    a.) MV 08/23/2015: EF 50%; b.) TTE 08/23/2015: EF 55-60%, G1DD; c.) TTE 11/02/2015: EF 55-60%, mild LA dil; d.) TTE 02/04/2020: EF 55-60%, mild LVH, G1DD, PASP 44.3, mild-mod AoV sclerosis; e.) MV 12/25/2020: EF 45-54%; f.)  R/LHC 02/04/2021: mRA 13, mPA 27, mPCWP 22, LVEDP 23, Ao sat 99, PA sat 70, CO 6.5, CI 3.0; g.) TTE 02/19/2021: EF 55-60%, G1DD, mildly red RVSF, triv MR, mild-mod AoV scler   Chronic lymphocytic  leukemia (HCC) 02/2014   Complication of anesthesia    a.) postoperative delirium exacerbating known PTSD --> requests that he not be touched during emergence from anesthesia   COPD (chronic obstructive pulmonary disease) (HCC)    Coronary artery disease    a.) s/p 3v CABG on 05/09/2009 --> LIMA->LAD, SVG->OM1, SVG->PDA;  b.) LHC 09/2009 : occluded VG x 2 w/ patent LIMA and L->R collats. EF 55%, mild antlat HK;  c.) s/p R/LHC 02/04/2021: LM mild diff dzs, LAD 100p, LCX nl, RCA 50p, 95d, 99 side branch, RPAV fills via LCx. SVG->RPDA 100, SVG->OM1 100, LIMA->LAD patent. EF 35-45%.   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    Hyperlipidemia LDL goal <70    Hypertension    Hypokalemia    Ischemic cardiomyopathy    a.) MV 08/23/2015: EF 50%; b.) TTE 08/23/2015: EF 55-60%; c.) TTE 11/02/2015: EF 55-60%; d.) TTE 02/04/2020: EF 55-60%; e.) MV 12/25/2020: EF 45-54%; f.) R/LHC 02/04/2021: mRA 13, mPA 27, mPCWP 22, LVEDP 23, Ao sat 99, PA sat 70, CO 6.5,  CI 3.0; g.) TTE 02/19/2021: EF 55-60%   Ischemic cerebrovascular accident (CVA) (HCC) 10/18/2021   Myocardial infarction (HCC) 2010   On amiodarone  therapy    On apixaban  therapy    OSA on CPAP    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   TIA (transient ischemic attack) 11/02/2015   PAST SURGICAL HISTORY :   Past Surgical History:  Procedure Laterality Date   ABDOMINAL HERNIA REPAIR     APPENDECTOMY  06/21/1985   CORONARY ARTERY BYPASS GRAFT N/A 05/09/2009   EP IMPLANTABLE DEVICE N/A 03/03/2016   Procedure: Pacemaker Implant;  Surgeon: Elspeth JAYSON Sage, MD;  Location: [facility] INVASIVE CV LAB;  Service: Cardiovascular;  Laterality: N/A;   FOREIGN BODY REMOVAL  1968   shrapnel in my tailbone   INGUINAL HERNIA REPAIR Right    JOINT REPLACEMENT Right 2018   LAPAROSCOPIC CHOLECYSTECTOMY     LEFT HEART CATH AND CORONARY ANGIOGRAPHY Left 05/08/2009   Procedure: LEFT HEART CATH AND CORONARY ANGIOGRAPHY; Location: [facility]: Surgeon: Darron Grass, MD   LEFT HEART CATH AND CORS/GRAFTS ANGIOGRAPHY Left 10/17/2009   Procedure: LEFT HEART CATH AND CORS/GRAFTS ANGIOGRAPHY; Location: [facility]; Surgeon: Darron Grass, MD   RIGHT/LEFT HEART CATH AND CORONARY/GRAFT ANGIOGRAPHY N/A 02/04/2021   Procedure: RIGHT/LEFT HEART CATH AND CORONARY/GRAFT ANGIOGRAPHY;  Surgeon: Mady Bruckner, MD;  Location: [facility] INVASIVE CV LAB;  Service: Cardiovascular;  Laterality: N/A;   TONSILLECTOMY AND ADENOIDECTOMY  1956   TOTAL HIP ARTHROPLASTY Right 10/22/2016   Procedure: TOTAL HIP ARTHROPLASTY;  Surgeon: Lynwood SHAUNNA Hue, MD;  Location: [facility] ORS;  Service: Orthopedics;  Laterality: Right;   TOTAL HIP ARTHROPLASTY Left 11/04/2017   Procedure: TOTAL HIP ARTHROPLASTY;  Surgeon: Hue Lynwood SHAUNNA, MD;  Location: [facility] ORS;  Service: Orthopedics;  Laterality: Left;    FAMILY HISTORY :   Family History  Problem Relation Age of Onset   Heart disease Mother    Heart attack Mother    Coronary artery disease Other        family history    SOCIAL HISTORY:   Social History   Tobacco Use   Smoking status: Former    Current packs/day: 0.00    Average packs/day: 1 pack/day  for 40.0 years (40.0 ttl pk-yrs)    Types: Cigarettes    Start date: 07/21/1966    Quit date: 07/21/2006    Years since quitting: 17.9   Smokeless tobacco: Never  Vaping Use   Vaping status: Former  Substance Use Topics   Alcohol use: Not Currently   Drug use: No    ALLERGIES:  is allergic to atorvastatin  and augmentin  [amoxicillin -pot clavulanate].  MEDICATIONS:  Current Outpatient Medications  Medication Sig Dispense Refill   acalabrutinib  maleate (CALQUENCE ) 100 MG tablet Take 1 tablet (100 mg total) by mouth 2 (two) times daily. 60 tablet 2   acetaminophen  (TYLENOL ) 325 MG tablet  Take 1-2 tablets (325-650 mg total) by mouth every 4 (four) hours as needed for mild pain.     albuterol  (VENTOLIN  HFA) 108 (90 Base) MCG/ACT inhaler Inhale 2 puffs into the lungs every 6 (six) hours as needed for wheezing or shortness of breath. 1 each 0   amiodarone  (PACERONE ) 200 MG tablet TAKE 1/2 TABLET BY MOUTH TWICE DAILY FOR 5 DAYS A WEEK 75 tablet 3   apixaban  (ELIQUIS ) 5 MG TABS tablet Take 1 tablet (5 mg total) by mouth 2 (two) times daily. 90 tablet 3   Apple Cider Vinegar 500 MG TABS Take 1 tablet by mouth daily.     azelastine  (ASTELIN ) 0.1 % nasal spray Place 2 sprays into both nostrils 2 (two) times daily. Use in each nostril as directed 30 mL 1   cetirizine (ZYRTEC) 10 MG tablet Take 10 mg by mouth daily as needed for allergies.      Cholecalciferol (VITAMIN D3) 250 MCG (10000 UT) capsule Take 10,000 Units by mouth daily.     Coenzyme Q10 (COQ10) 200 MG CAPS Take 200 mg by mouth daily.     cyanocobalamin  (VITAMIN B12) 1000 MCG tablet Take 1,000 mcg by mouth daily.     Ensifentrine  (OHTUVAYRE ) 3 MG/2.5ML SUSP Inhale 2.5 mLs into the lungs 2 (two) times daily.     ezetimibe  (ZETIA ) 10 MG tablet Take 1 tablet (10 mg total) by mouth daily. 90 tablet 3   ferrous sulfate  325 (65 FE) MG EC tablet Take 325 mg by mouth daily.     ipratropium-albuterol  (DUONEB) 0.5-2.5 (3) MG/3ML SOLN Take 3 mLs by  nebulization every 6 (six) hours as needed (shorntess of breath). 360 mL 0   mirtazapine  (REMERON ) 15 MG tablet Take 15 mg by mouth at bedtime as needed (for panic associated with PTSD).     Multiple Vitamin (MULTIVITAMIN WITH MINERALS) TABS tablet Take 1 tablet by mouth daily.     pantoprazole  (PROTONIX ) 40 MG tablet TAKE 1 TABLET(40 MG) BY MOUTH DAILY 90 tablet 3   REPATHA  SURECLICK 140 MG/ML SOAJ ADMINISTER 1 ML UNDER THE SKIN EVERY 14 DAYS 2 mL 11   tamsulosin  (FLOMAX ) 0.4 MG CAPS capsule TAKE 1 CAPSULE(0.4 MG) BY MOUTH DAILY 30 capsule 3   Tiotropium Bromide  Monohydrate 2.5 MCG/ACT AERS Inhale 2 puffs into the lungs at bedtime. Spiriva      tiZANidine  (ZANAFLEX ) 4 MG capsule Take 1 capsule (4 mg total) by mouth 3 (three) times daily. This can make you sleepy. 60 capsule 0   traMADol  (ULTRAM ) 50 MG tablet Take 1 tablet (50 mg total) by mouth 2 (two) times daily. 60 tablet 0   No current facility-administered medications for this visit.   Facility-Administered Medications Ordered in Other Visits  Medication Dose Route Frequency Provider Last Rate Last Admin   potassium chloride  10 mEq in 100 mL IVPB  10 mEq Intravenous Once Borders, Fonda SAUNDERS, NP        PHYSICAL EXAMINATION: ECOG PERFORMANCE STATUS: 1 - Symptomatic but completely ambulatory  BP 135/70 (BP Location: Left Arm, Patient Position: Sitting, Cuff Size: Large)   Pulse 77   Temp 97.8 F (36.6 C) (Oral)   Resp 18   Ht 5' 9 (1.753 m)   Wt 202 lb (91.6 kg)   SpO2 100%   BMI 29.83 kg/m   Filed Weights   06/20/24 1310  Weight: 202 lb (91.6 kg)    Physical Exam HENT:     Head: Normocephalic and atraumatic.  Mouth/Throat:     Pharynx: No oropharyngeal exudate.  Eyes:     Pupils: Pupils are equal, round, and reactive to light.  Cardiovascular:     Rate and Rhythm: Normal rate and regular rhythm.  Pulmonary:     Effort: No respiratory distress.     Breath sounds: No wheezing.  Abdominal:     General: Bowel sounds  are normal. There is no distension.     Palpations: Abdomen is soft. There is no mass.     Tenderness: There is no abdominal tenderness. There is no guarding or rebound.  Musculoskeletal:        General: No tenderness. Normal range of motion.     Cervical back: Normal range of motion and neck supple.  Skin:    General: Skin is warm.  Neurological:     Mental Status: He is alert and oriented to person, place, and time.  Psychiatric:        Mood and Affect: Affect normal.     LABORATORY DATA:  I have reviewed the data as listed    Component Value Date/Time   NA 143 06/20/2024 1312   NA 143 03/11/2021 1136   NA 139 10/11/2014 1800   K 4.2 06/20/2024 1312   K 3.3 (L) 10/11/2014 1800   CL 108 06/20/2024 1312   CL 106 10/11/2014 1800   CO2 26 06/20/2024 1312   CO2 27 10/11/2014 1800   GLUCOSE 101 (H) 06/20/2024 1312   GLUCOSE 107 (H) 10/11/2014 1800   BUN 17 06/20/2024 1312   BUN 13 03/11/2021 1136   BUN 15 10/11/2014 1800   CREATININE 0.99 06/20/2024 1312   CREATININE 0.89 10/11/2014 1800   CALCIUM  8.9 06/20/2024 1312   CALCIUM  8.8 (L) 10/11/2014 1800   PROT 6.7 06/20/2024 1312   PROT 6.7 05/18/2017 1048   PROT 6.4 (L) 10/11/2014 1800   ALBUMIN  4.0 06/20/2024 1312   ALBUMIN  4.3 05/18/2017 1048   ALBUMIN  4.1 10/11/2014 1800   AST 19 06/20/2024 1312   ALT 16 06/20/2024 1312   ALT 22 10/11/2014 1800   ALKPHOS 74 06/20/2024 1312   ALKPHOS 61 10/11/2014 1800   BILITOT 0.8 06/20/2024 1312   GFRNONAA >60 06/20/2024 1312   GFRNONAA >60 10/11/2014 1800   GFRAA >60 04/12/2020 0959   GFRAA >60 10/11/2014 1800    No results found for: SPEP, UPEP  Lab Results  Component Value Date   WBC 6.4 06/20/2024   NEUTROABS 3.4 06/20/2024   HGB 12.1 (L) 06/20/2024   HCT 37.1 (L) 06/20/2024   MCV 101.6 (H) 06/20/2024   PLT 146 (L) 06/20/2024      Chemistry      Component Value Date/Time   NA 143 06/20/2024 1312   NA 143 03/11/2021 1136   NA 139 10/11/2014 1800   K 4.2  06/20/2024 1312   K 3.3 (L) 10/11/2014 1800   CL 108 06/20/2024 1312   CL 106 10/11/2014 1800   CO2 26 06/20/2024 1312   CO2 27 10/11/2014 1800   BUN 17 06/20/2024 1312   BUN 13 03/11/2021 1136   BUN 15 10/11/2014 1800   CREATININE 0.99 06/20/2024 1312   CREATININE 0.89 10/11/2014 1800      Component Value Date/Time   CALCIUM  8.9 06/20/2024 1312   CALCIUM  8.8 (L) 10/11/2014 1800   ALKPHOS 74 06/20/2024 1312   ALKPHOS 61 10/11/2014 1800   AST 19 06/20/2024 1312   ALT 16 06/20/2024 1312   ALT 22 10/11/2014 1800  BILITOT 0.8 06/20/2024 1312       RADIOGRAPHIC STUDIES: I have personally reviewed the radiological images as listed and agreed with the findings in the report. No results found.    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  Patient currently on Acalabrutnib [AUG 16th, 2023]-  Patient admits to compliance- SEP 16th, 2025- NECK- CT AP Significant interval improvement of retroperitoneal lymphadenopathy, including decreased size of left retroperitoneal and right retrocaval nodes, and resolution of right external iliac lymphadenopathy.  # Clinically stable; no evidence of any progression - admit compliance. Continue acalabrutinib - stable. Will order imaging CT sacn at next visit. .   # Neck pain/Joint pains/ Muscle pain- s/p evaluation with  Dr.Chasnis/ Dr.Brandon Claudene; [facility]. S/p  surgery [AUG 2024]- stable .   # Easy bruising [posterior Legs]sec Eliquis  + Acalabrutinib - stable.  # Mild to moderate anemia:[>>> Colo] Continue gentle iron/slow iron.Hb- 12.3- improving-    stable   # Low immunoglobulins IgG 330/Hx  severe respiratory infection/CLL-s/pI IVIG #4/4 [last April 2022 -awaiting immunoglobulins- stable   # COPD [Dr.Kasa]-chronic cough sputum.  No clinical evidence of any acute exacerbation at this time- stable   # A.fib-on amiodarone  [ Dr.Gollan] sinus rhythm-on Eliquis /CHF- lasix   40/d- s/p Cath [AUG, 2022- Echo-55%] stable   # Left sided CVA- [s/p rehab; April 2023]- stable  # Vaccination: s/p Flu shot [2024]; recommend COVID/RSV  81m # DISPOSITION: # follow up in 2 months- 2025- MD; labs- cbc/cmp; LDH; immunoglobulins--Dr.B    Orders Placed This Encounter  Procedures   CBC with Differential (Cancer Center Only)    Standing Status:   Future    Expected Date:   08/22/2024    Expiration Date:   11/20/2024   CMP (Cancer Center only)    Standing Status:   Future    Expected Date:   08/22/2024    Expiration Date:   11/20/2024   Lactate dehydrogenase    Standing Status:   Future    Expected Date:   08/22/2024    Expiration Date:   11/20/2024   Immunoglobulins, QN, A/E/G/M    Standing Status:   Future    Expected Date:   08/22/2024    Expiration Date:   11/20/2024   All questions were answered. The patient knows to call the clinic with any problems, questions or concerns.     Cindy JONELLE Joe, MD 06/20/2024 2:27 PM

## 2024-06-20 NOTE — Progress Notes (Signed)
 Pt now on a nebulizer, he states it seems to help with his breathing, x1-2 weeks.

## 2024-06-20 NOTE — Assessment & Plan Note (Addendum)
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  Patient currently on Acalabrutnib [AUG 16th, 2023]-  Patient admits to compliance- SEP 16th, 2025- NECK- CT AP Significant interval improvement of retroperitoneal lymphadenopathy, including decreased size of left retroperitoneal and right retrocaval nodes, and resolution of right external iliac lymphadenopathy.  # Clinically stable; no evidence of any progression - admit compliance. Continue acalabrutinib - stable. Will order imaging CT sacn at next visit. .   # Neck pain/Joint pains/ Muscle pain- s/p evaluation with  Dr.Chasnis/ Dr.Brandon Claudene; [facility]. S/p  surgery [AUG 2024]- stable .   # Easy bruising [posterior Legs]sec Eliquis  + Acalabrutinib - stable.  # Mild to moderate anemia:[>>> Colo] Continue gentle iron/slow iron.Hb- 12.3- improving-    stable   # Low immunoglobulins IgG 330/Hx  severe respiratory infection/CLL-s/pI IVIG #4/4 [last April 2022 -awaiting immunoglobulins- stable   # COPD [Dr.Kasa]-chronic cough sputum.  No clinical evidence of any acute exacerbation at this time- stable   # A.fib-on amiodarone  [ Dr.Gollan] sinus rhythm-on Eliquis /CHF- lasix  40/d- s/p Cath [AUG, 2022- Echo-55%] stable   # Left sided CVA- [s/p rehab; April 2023]- stable  # Vaccination: s/p Flu shot [2024]; recommend COVID/RSV  43m # DISPOSITION: # follow up in 2 months- 2025- MD; labs- cbc/cmp; LDH; immunoglobulins--Dr.B

## 2024-06-21 ENCOUNTER — Other Ambulatory Visit: Payer: Self-pay

## 2024-06-21 NOTE — Progress Notes (Signed)
 Specialty Pharmacy Refill Coordination Note  KAYVION ARNESON is a 78 y.o. male contacted today regarding refills of specialty medication(s) Acalabrutinib  Maleate (Calquence )   Patient requested Delivery   Delivery date: 06/23/24   Verified address: [address]  [place] [address]   Medication will be filled on: 06/22/24

## 2024-06-22 LAB — IMMUNOGLOBULINS A/E/G/M, SERUM
IgA: 17 mg/dL — ABNORMAL LOW (ref 61–437)
IgE (Immunoglobulin E), Serum: 414 [IU]/mL (ref 6–495)
IgG (Immunoglobin G), Serum: 858 mg/dL (ref 603–1613)
IgM (Immunoglobulin M), Srm: 44 mg/dL (ref 15–143)

## 2024-06-23 ENCOUNTER — Ambulatory Visit: Admitting: Internal Medicine

## 2024-06-23 ENCOUNTER — Encounter: Payer: Self-pay | Admitting: Internal Medicine

## 2024-06-23 VITALS — BP 130/80 | HR 63 | Temp 98.6°F | Ht 69.0 in | Wt 198.0 lb

## 2024-06-23 DIAGNOSIS — G4733 Obstructive sleep apnea (adult) (pediatric): Secondary | ICD-10-CM | POA: Diagnosis not present

## 2024-06-23 DIAGNOSIS — J449 Chronic obstructive pulmonary disease, unspecified: Secondary | ICD-10-CM

## 2024-06-23 DIAGNOSIS — I4891 Unspecified atrial fibrillation: Secondary | ICD-10-CM

## 2024-06-23 DIAGNOSIS — Z950 Presence of cardiac pacemaker: Secondary | ICD-10-CM | POA: Diagnosis not present

## 2024-06-23 NOTE — Progress Notes (Signed)
 SYNOPSIS 78 year old male, former smoker. PMH significant for COPD, OSA. Former patient of Dr. Linard, last seen 01/18/2018. Maintained on CPAP for sleep apnea.     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 Probable mild obstruction (FEV1 70% pred) with gas trapping (RV 134% pred) and mild-mod decrease in DLCO PSG (07/25/15): Overall AHI < 10/hr but > 100/hr in supine position. ROV (07/30/15): Dyspnea on exertion is moderately improved with scheduled use of Symbicort . He is also on Spiriva , PRN albuterol  with rare use of albuterol  MDI. PSG results not available. Continue same COPD regimen CPAP titration (08/29/15): recommend full mask with CPAP 8 cm H2O CPAP compliance 11/26-12/25/18: Usage 30/30. Greater than 4 hrs: 27/30.  CPAP 8 cm PREVIOUS AHI 44   CC Follow-up assessment COPD Follow-up assessment for OSA     HPI Regarding COPD Stable no exacerbation No indication for antibiotics or prednisone  at this time Continue Spiriva  as prescribed Use albuterol  as needed   Discussed sleep data and reviewed with patient.  Encouraged proper weight management.  Discussed sleep hygiene Patient uses and benefits from therapy Using CPAP nightly and with naps Settings are comfortable and is sleeping well. CPAP 8 AHI reduced to 3.1  Excellent compliance report Previous AHI 44   Patient with atrial fibrillation status post pacemaker Continue amiodarone  therapy Continue oral anticoagulation therapy pulmonary function testing to assess for restrictive lung disease /COPD   Counseling given: Not Answered   Outpatient Medications Prior to Visit  Medication Sig Dispense Refill   acalabrutinib  maleate (CALQUENCE ) 100 MG tablet Take 1 tablet (100 mg total)  by mouth 2 (two) times daily. 60 tablet 2   acetaminophen  (TYLENOL ) 325 MG tablet Take 1-2 tablets (325-650 mg total) by mouth every 4 (four) hours as needed for mild pain.     albuterol  (VENTOLIN  HFA) 108 (90 Base) MCG/ACT inhaler Inhale 2 puffs into the lungs every 6 (six) hours as needed for wheezing or shortness of breath. 1 each 0   amiodarone  (PACERONE ) 200 MG tablet TAKE 1/2 TABLET BY MOUTH TWICE DAILY FOR 5 DAYS A WEEK 75 tablet 3   apixaban  (ELIQUIS ) 5 MG TABS tablet Take 1 tablet (5 mg total) by mouth 2 (two) times daily. 90 tablet 3   Apple Cider Vinegar 500 MG TABS Take 1 tablet by mouth daily.     azelastine  (ASTELIN ) 0.1 % nasal spray Place 2 sprays into both nostrils 2 (two) times daily. Use in each nostril as directed 30 mL 1   cetirizine (ZYRTEC) 10 MG tablet Take 10 mg by mouth daily as needed for allergies.      Cholecalciferol (VITAMIN D3) 250 MCG (10000 UT) capsule Take 10,000 Units by mouth daily.     Coenzyme Q10 (COQ10) 200 MG CAPS Take 200 mg by mouth daily.     cyanocobalamin  (VITAMIN B12) 1000 MCG tablet Take 1,000 mcg by mouth daily.     Ensifentrine  (OHTUVAYRE ) 3 MG/2.5ML SUSP Inhale 2.5 mLs into the lungs 2 (two) times daily.     ezetimibe  (ZETIA ) 10 MG tablet Take 1 tablet (10 mg total) by mouth daily. 90 tablet 3   ferrous sulfate  325 (65 FE) MG EC tablet Take 325 mg by mouth daily.     ipratropium-albuterol  (DUONEB)  0.5-2.5 (3) MG/3ML SOLN Take 3 mLs by nebulization every 6 (six) hours as needed (shorntess of breath). 360 mL 0   mirtazapine  (REMERON ) 15 MG tablet Take 15 mg by mouth at bedtime as needed (for panic associated with PTSD).     Multiple Vitamin (MULTIVITAMIN WITH MINERALS) TABS tablet Take 1 tablet by mouth daily.     pantoprazole  (PROTONIX ) 40 MG tablet TAKE 1 TABLET(40 MG) BY MOUTH DAILY 90 tablet 3   REPATHA  SURECLICK 140 MG/ML SOAJ ADMINISTER 1 ML UNDER THE SKIN EVERY 14 DAYS 2 mL 11   tamsulosin  (FLOMAX ) 0.4 MG CAPS capsule TAKE 1 CAPSULE(0.4 MG)  BY MOUTH DAILY 30 capsule 3   Tiotropium Bromide  Monohydrate 2.5 MCG/ACT AERS Inhale 2 puffs into the lungs at bedtime. Spiriva      tiZANidine  (ZANAFLEX ) 4 MG capsule Take 1 capsule (4 mg total) by mouth 3 (three) times daily. This can make you sleepy. 60 capsule 0   traMADol  (ULTRAM ) 50 MG tablet Take 1 tablet (50 mg total) by mouth 2 (two) times daily. 60 tablet 0   Facility-Administered Medications Prior to Visit  Medication Dose Route Frequency Provider Last Rate Last Admin   potassium chloride  10 mEq in 100 mL IVPB  10 mEq Intravenous Once Borders, Fonda R, NP        BP 130/80   Pulse 63   Temp 98.6 F (37 C)   Ht 5' 9 (1.753 m)   Wt 198 lb (89.8 kg)   SpO2 98%   BMI 29.24 kg/m      Physical Examination:  General Appearance: No distress  EYES EOM intact.   NECK Supple, No JVD Pulmonary: normal breath sounds, No wheezing.  CardiovascularNormal S1,S2.  No m/r/g.   Ext pulses intact, cap refill intact  ALL OTHER ROS ARE NEGATIVE   Lab Results:  CBC    Component Value Date/Time   WBC 6.4 06/20/2024 1312   WBC 7.6 02/08/2024 1703   RBC 3.65 (L) 06/20/2024 1312   HGB 12.1 (L) 06/20/2024 1312   HGB 12.5 (L) 03/11/2021 1136   HCT 37.1 (L) 06/20/2024 1312   HCT 36.9 (L) 03/11/2021 1136   PLT 146 (L) 06/20/2024 1312   PLT 159 03/11/2021 1136   MCV 101.6 (H) 06/20/2024 1312   MCV 100 (H) 03/11/2021 1136   MCV 95 10/24/2014 0837   MCH 33.2 06/20/2024 1312   MCHC 32.6 06/20/2024 1312   RDW 14.6 06/20/2024 1312   RDW 13.5 03/11/2021 1136   RDW 14.6 (H) 10/24/2014 0837   LYMPHSABS 2.4 06/20/2024 1312   LYMPHSABS 0.8 (L) 10/24/2014 0837   MONOABS 0.5 06/20/2024 1312   MONOABS 0.6 10/24/2014 0837   EOSABS 0.1 06/20/2024 1312   EOSABS 0.2 10/24/2014 0837   BASOSABS 0.0 06/20/2024 1312   BASOSABS 0.0 10/24/2014 0837    BMET    Component Value Date/Time   NA 143 06/20/2024 1312   NA 143 03/11/2021 1136   NA 139 10/11/2014 1800   K 4.2 06/20/2024 1312   K  3.3 (L) 10/11/2014 1800   CL 108 06/20/2024 1312   CL 106 10/11/2014 1800   CO2 26 06/20/2024 1312   CO2 27 10/11/2014 1800   GLUCOSE 101 (H) 06/20/2024 1312   GLUCOSE 107 (H) 10/11/2014 1800   BUN 17 06/20/2024 1312   BUN 13 03/11/2021 1136   BUN 15 10/11/2014 1800   CREATININE 0.99 06/20/2024 1312   CREATININE 0.89 10/11/2014 1800   CALCIUM  8.9 06/20/2024 1312  CALCIUM  8.8 (L) 10/11/2014 1800   GFRNONAA >60 06/20/2024 1312   GFRNONAA >60 10/11/2014 1800   GFRAA >60 04/12/2020 0959   GFRAA >60 10/11/2014 1800      ASSESSMENT AND PLAN 78 year old former smoker follow-up assessment for COPD OSA Patient with atrial fibrillation on amiodarone  therapy  Assessment of COPD Stable no exacerbations Continue nebulized phosphodiesterase inhibitor-OHTUVAYRE  Rinse mouth after use COPD FEV1 60% predicted moderate No significant symptoms I recommend patient using his nebulizer with normal breathing technique Continue Spiriva  as prescribed   Assessment of OSA Previous AHI 44 Continue CPAP as prescribed  Excellent compliance report Reviewed compliance report in detail with patient Patient definitely benefits the use of CPAP therapy as prescribed Using CPAP nightly and with naps Pressure setting is comfortable and is sleeping well. CPAP prescription 8 AHI reduced to 3.1  No evidence of acute heart failure at this time No respiratory distress No fevers, chills, nausea, vomiting, diarrhea No evidence hemoptysis  Patient Instructions Continue to use CPAP every night, minimum of 4-6 hours a night.  Change equipment every 30 days or as directed by DME.  Wash your tubing with warm soap and water  daily, hang to dry.  Wash humidifier portion weekly. Use bottled, distilled water  and change daily  Risk of untreated sleep apnea including cardiac arrhthymias, stroke, DM, pulm HTN.  OSA well-controlled Patient uses nasal pillows   A. fib RVR with amiodarone  therapy Follow-up  cardiology as scheduled PFTs as needed  MEDICATION ADJUSTMENTS/LABS AND TESTS ORDERED: Continue amiodarone  Continue CPAP as prescribed Continue Spiriva  as prescribed Patient is to continue on nebulized phosphodiesterase inhibitor-OHTUVAYRE  Avoid Allergens and Irritants Avoid secondhand smoke Avoid SICK contacts Recommend  Masking  when appropriate Recommend Keep up-to-date with vaccinations    CURRENT MEDICATIONS REVIEWED AT LENGTH WITH PATIENT TODAY   Patient  satisfied with Plan of action and management. All questions answered   Follow up 6 months   I spent a total of 48 minutes dedicated to the care of this patient on the date of this encounter to include pre-visit review of records, face-to-face time with the patient discussing conditions above, post visit ordering of testing, clinical documentation with the electronic health record, making appropriate referrals as documented, and communicating necessary information to the patient's healthcare team.    The Patient requires high complexity decision making for assessment and support, frequent evaluation and titration of therapies, application of advanced monitoring technologies and extensive interpretation of multiple databases.  Patient satisfied with Plan of action and management. All questions answered    Nickolas Alm Cellar, M.D.  [facility] Pulmonary & Critical Care Medicine  Medical Director [facility] [place]

## 2024-06-23 NOTE — Patient Instructions (Addendum)
 Excellent Job A+ GOLD STAR!!  Continue CPAP as prescribed  Patient Instructions Continue to use CPAP every night, minimum of 4-6 hours a night.  Change equipment every 30 days or as directed by DME.  Wash your tubing with warm soap and water  daily, hang to dry. Wash humidifier portion weekly. Use bottled, distilled water  and change daily   Be aware of reduced alertness and do not drive or operate heavy machinery if experiencing this or drowsiness.  Exercise encouraged, as tolerated. Encouraged proper weight management.  Important to get eight or more hours of sleep  Limiting the use of the computer and television before bedtime.  Decrease naps during the day, so night time sleep will become enhanced.  Limit caffeine, and sleep deprivation.    Avoid Allergens and Irritants Avoid secondhand smoke Avoid SICK contacts Recommend  Masking  when appropriate Recommend Keep up-to-date with vaccinations  Continue amiodarone  as prescribed Continue Spiriva  as prescribed Continue OHTUVAYRE 

## 2024-07-08 ENCOUNTER — Ambulatory Visit: Admitting: Nurse Practitioner

## 2024-07-11 ENCOUNTER — Other Ambulatory Visit: Payer: Self-pay

## 2024-07-13 ENCOUNTER — Other Ambulatory Visit: Payer: Self-pay

## 2024-07-13 ENCOUNTER — Other Ambulatory Visit: Payer: Self-pay | Admitting: Internal Medicine

## 2024-07-13 DIAGNOSIS — C9110 Chronic lymphocytic leukemia of B-cell type not having achieved remission: Secondary | ICD-10-CM

## 2024-07-13 LAB — MISCELLANEOUS TEST

## 2024-07-13 MED ORDER — CALQUENCE 100 MG PO TABS
100.0000 mg | ORAL_TABLET | Freq: Two times a day (BID) | ORAL | 2 refills | Status: AC
  Filled 2024-07-13 – 2024-07-15 (×2): qty 60, 30d supply, fill #0
  Filled 2024-08-11 – 2024-08-12 (×2): qty 60, 30d supply, fill #1

## 2024-07-15 ENCOUNTER — Other Ambulatory Visit: Payer: Self-pay

## 2024-07-15 ENCOUNTER — Other Ambulatory Visit: Payer: Self-pay | Admitting: Pharmacy Technician

## 2024-07-15 NOTE — Progress Notes (Signed)
 Specialty Pharmacy Refill Coordination Note  [name] is a 78 y.o. male contacted today regarding refills of specialty medication(s) Acalabrutinib  Maleate (Calquence )   Patient requested Delivery   Delivery date: 07/22/24   Verified address: [address]  [place]   Medication will be filled on: 07/20/24

## 2024-07-19 ENCOUNTER — Other Ambulatory Visit: Payer: Self-pay

## 2024-07-19 NOTE — Progress Notes (Signed)
 Specialty Pharmacy Ongoing Clinical Assessment Note  [name] is a 78 y.o. male who is being followed by the specialty pharmacy service for RxSp Oncology   Patient's specialty medication(s) reviewed today: Acalabrutinib  Maleate (Calquence )   Missed doses in the last 4 weeks: 0   Patient/Caregiver did not have any additional questions or concerns.   Therapeutic benefit summary: Patient is achieving benefit   Adverse events/side effects summary: No adverse events/side effects   Patient's therapy is appropriate to: Continue    Goals Addressed             This Visit's Progress    Slow Disease Progression   On track    Patient is on track. Patient will maintain adherence. Per provider notes from 06/20/24 patient is clinically stable; no evidence of any progression.          Follow up: 6 months  Airi Copado M Mindi Akerson Specialty Pharmacist

## 2024-07-20 ENCOUNTER — Other Ambulatory Visit: Payer: Self-pay

## 2024-07-28 ENCOUNTER — Telehealth: Payer: Self-pay

## 2024-07-28 NOTE — Telephone Encounter (Signed)
 Copied from CRM #[id number]. Topic: Clinical - Prescription Issue >> Jul 26, 2024 10:16 AM Corean SAUNDERS wrote: Reason for CRM: Patient is very upset because he was charged $510 for his Ensifentrine  (OHTUVAYRE ) 3 MG/2.5ML SUSP this month when he usually only pays $10 for the prescription.  Patient is insisting on a call back from [facility] or nurse to confirm that the clinic didn't mess up the prescription.  Patient advised to contact insurance company right away. Pharmacy advised patient that this price is correct.    I do not see anything wrong with this prescription. I will route to our pharmacy team to see if they know anything.

## 2024-07-29 NOTE — Telephone Encounter (Signed)
 I called and spoke to pt. I informed pt that due to the new year, he will have a new deductible to meet. Pt verbalized understanding. Pt also complained of worse ing sob that he wanted to discuss with Dr Isaiah in person. I have scheduled pt an appt but informed him that f he has any worsening symptoms up until his appt, he needs to go to the er. Pt verbalized understanding. NFN

## 2024-08-02 ENCOUNTER — Ambulatory Visit: Attending: Cardiology

## 2024-08-02 DIAGNOSIS — I495 Sick sinus syndrome: Secondary | ICD-10-CM

## 2024-08-03 LAB — CUP PACEART REMOTE DEVICE CHECK
Battery Remaining Longevity: 21 mo
Battery Voltage: 2.92 V
Brady Statistic AP VP Percent: 2.47 %
Brady Statistic AP VS Percent: 84.22 %
Brady Statistic AS VP Percent: 0.04 %
Brady Statistic AS VS Percent: 13.27 %
Brady Statistic RA Percent Paced: 86.71 %
Brady Statistic RV Percent Paced: 2.66 %
Date Time Interrogation Session: 20260113140237
Implantable Lead Connection Status: 753985
Implantable Lead Connection Status: 753985
Implantable Lead Implant Date: 20170814
Implantable Lead Implant Date: 20170814
Implantable Lead Location: 753859
Implantable Lead Location: 753860
Implantable Lead Model: 5076
Implantable Lead Model: 5076
Implantable Pulse Generator Implant Date: 20170814
Lead Channel Impedance Value: 323 Ohm
Lead Channel Impedance Value: 437 Ohm
Lead Channel Impedance Value: 456 Ohm
Lead Channel Impedance Value: 532 Ohm
Lead Channel Pacing Threshold Amplitude: 0.75 V
Lead Channel Pacing Threshold Amplitude: 0.75 V
Lead Channel Pacing Threshold Pulse Width: 0.4 ms
Lead Channel Pacing Threshold Pulse Width: 0.4 ms
Lead Channel Sensing Intrinsic Amplitude: 19.375 mV
Lead Channel Sensing Intrinsic Amplitude: 19.375 mV
Lead Channel Sensing Intrinsic Amplitude: 2.75 mV
Lead Channel Sensing Intrinsic Amplitude: 2.75 mV
Lead Channel Setting Pacing Amplitude: 2 V
Lead Channel Setting Pacing Amplitude: 2.5 V
Lead Channel Setting Pacing Pulse Width: 0.4 ms
Lead Channel Setting Sensing Sensitivity: 0.9 mV
Zone Setting Status: 755011
Zone Setting Status: 755011

## 2024-08-07 ENCOUNTER — Ambulatory Visit: Payer: Self-pay | Admitting: Cardiology

## 2024-08-08 DIAGNOSIS — Z951 Presence of aortocoronary bypass graft: Secondary | ICD-10-CM | POA: Insufficient documentation

## 2024-08-08 NOTE — Progress Notes (Signed)
 Remote PPM Transmission

## 2024-08-11 ENCOUNTER — Encounter: Payer: Self-pay | Admitting: Nurse Practitioner

## 2024-08-11 ENCOUNTER — Other Ambulatory Visit: Payer: Self-pay | Admitting: Pharmacy Technician

## 2024-08-11 ENCOUNTER — Other Ambulatory Visit (HOSPITAL_COMMUNITY): Payer: Self-pay

## 2024-08-11 ENCOUNTER — Other Ambulatory Visit: Payer: Self-pay

## 2024-08-11 ENCOUNTER — Ambulatory Visit (INDEPENDENT_AMBULATORY_CARE_PROVIDER_SITE_OTHER): Admitting: Nurse Practitioner

## 2024-08-11 ENCOUNTER — Encounter: Payer: Self-pay | Admitting: Internal Medicine

## 2024-08-11 VITALS — BP 158/73 | HR 62 | Temp 98.2°F | Ht 69.0 in | Wt 198.0 lb

## 2024-08-11 DIAGNOSIS — J432 Centrilobular emphysema: Secondary | ICD-10-CM | POA: Diagnosis not present

## 2024-08-11 DIAGNOSIS — I480 Paroxysmal atrial fibrillation: Secondary | ICD-10-CM | POA: Diagnosis not present

## 2024-08-11 DIAGNOSIS — I10 Essential (primary) hypertension: Secondary | ICD-10-CM | POA: Diagnosis not present

## 2024-08-11 DIAGNOSIS — I495 Sick sinus syndrome: Secondary | ICD-10-CM

## 2024-08-11 NOTE — Progress Notes (Signed)
 " Leron Glance, NP-C Phone: [phone]  [name] is a 79 y.o. male who presents today for follow up.  Discussed the use of AI scribe software for clinical note transcription with the patient, who gave verbal consent to proceed.  History of Present Illness   [name] is a 79 year old male with COPD who presents with shortness of breath.  He was recently hospitalized from January 19th to January 21st for shortness of breath and recalls being told there was fluid around his lungs. During his hospital stay, he was treated with prednisone  and discharged with a prescription for a five-day course, of which he has three days remaining. He regularly uses inhalers and has access to oxygen  provided by his son-in-law, which he uses as needed. His insurance does not cover oxygen  unless it is needed permanently.  He has a history of COPD and experiences exacerbations, particularly in cold weather, which worsens his breathing. He feels better when resting. He denies recent antibiotic use and was not prescribed any during his hospital stay. He was given Lasix  for fluid management and iron pills for low iron levels.  He has an upcoming pulmonology appointment on January 27th and a follow-up with oncology on February 4th. He has been seen by cardiology and oncology since his last visit. He feels generally unwell, with some body aches and congestion. No fever, flu, or pneumonia, as these were ruled out during his recent hospitalization.  His wife monitors his condition closely, ensuring he stays indoors. He uses a cane for mobility and declines the use of a wheelchair.      Tobacco Use History[1]  Medications Ordered Prior to Encounter[2]   ROS see history of present illness  Objective  Physical Exam Vitals:   08/11/24 1026 08/11/24 1037  BP: (!) 156/78 (!) 158/73  Pulse: 62   Temp: 98.2 F (36.8 C)   SpO2: 96%     BP Readings from Last 3 Encounters:  08/16/24 130/70  08/11/24  (!) 158/73  06/23/24 130/80   Wt Readings from Last 3 Encounters:  08/16/24 198 lb (89.8 kg)  08/11/24 198 lb (89.8 kg)  06/23/24 198 lb (89.8 kg)    Physical Exam Constitutional:      General: He is not in acute distress.    Appearance: Normal appearance.  HENT:     Head: Normocephalic.  Cardiovascular:     Rate and Rhythm: Normal rate and regular rhythm.     Heart sounds: Normal heart sounds.  Pulmonary:     Effort: Pulmonary effort is normal.     Breath sounds: Normal breath sounds.  Skin:    General: Skin is warm and dry.  Neurological:     General: No focal deficit present.     Mental Status: He is alert.  Psychiatric:        Mood and Affect: Mood normal.        Behavior: Behavior normal.      Assessment/Plan: Please see individual problem list.  Centrilobular emphysema (HCC) Assessment & Plan: He was recently hospitalized for an exacerbation with pleural effusion. Symptoms are likely due to COPD exacerbation or a viral infection, worsened by cold weather. Continue prednisone  for 3 more days and use inhalers as prescribed. Monitor oxygen  saturation at home. Follow up with pulmonology on January 27th. Return to the hospital if symptoms worsen or do not improve.   Essential hypertension Assessment & Plan: Blood pressure is elevated, likely due to the current illness  and shortness of breath. Previously adequately controlled. Not on any medications. We will continue to monitor.    Paroxysmal atrial fibrillation [facility]) Assessment & Plan: He is on Eliquis  and amiodarone  and is under cardiology care. Continue Eliquis  and amiodarone . Follow up with cardiology as scheduled.   Sick sinus syndrome [facility]) Assessment & Plan: Follow up with Cardiology as scheduled.        Return in about 6 months (around 02/08/2025) for Follow up.   Leron Glance, NP-C [facility] [place]     [1]  Social History Tobacco Use  Smoking Status Former    Current packs/day: 0.00   Average packs/day: 1 pack/day for 40.0 years (40.0 ttl pk-yrs)   Types: Cigarettes   Start date: 07/21/1966   Quit date: 07/21/2006   Years since quitting: 18.0  Smokeless Tobacco Never  [2]  Current Outpatient Medications on File Prior to Visit  Medication Sig Dispense Refill   acalabrutinib  maleate (CALQUENCE ) 100 MG tablet Take 1 tablet (100 mg total) by mouth 2 (two) times daily. 60 tablet 2   acetaminophen  (TYLENOL ) 325 MG tablet Take 1-2 tablets (325-650 mg total) by mouth every 4 (four) hours as needed for mild pain.     albuterol  (VENTOLIN  HFA) 108 (90 Base) MCG/ACT inhaler Inhale 2 puffs into the lungs every 6 (six) hours as needed for wheezing or shortness of breath. 1 each 0   amiodarone  (PACERONE ) 200 MG tablet TAKE 1/2 TABLET BY MOUTH TWICE DAILY FOR 5 DAYS A WEEK 75 tablet 3   apixaban  (ELIQUIS ) 5 MG TABS tablet Take 1 tablet (5 mg total) by mouth 2 (two) times daily. 90 tablet 3   Apple Cider Vinegar 500 MG TABS Take 1 tablet by mouth daily.     azelastine  (ASTELIN ) 0.1 % nasal spray Place 2 sprays into both nostrils 2 (two) times daily. Use in each nostril as directed 30 mL 1   cetirizine (ZYRTEC) 10 MG tablet Take 10 mg by mouth daily as needed for allergies.      Cholecalciferol (VITAMIN D3) 250 MCG (10000 UT) capsule Take 10,000 Units by mouth daily.     Coenzyme Q10 (COQ10) 200 MG CAPS Take 200 mg by mouth daily.     cyanocobalamin  (VITAMIN B12) 1000 MCG tablet Take 1,000 mcg by mouth daily.     Ensifentrine  (OHTUVAYRE ) 3 MG/2.5ML SUSP Inhale 2.5 mLs into the lungs 2 (two) times daily.     ezetimibe  (ZETIA ) 10 MG tablet Take 1 tablet (10 mg total) by mouth daily. 90 tablet 3   ferrous sulfate  325 (65 FE) MG EC tablet Take 325 mg by mouth daily.     ipratropium-albuterol  (DUONEB) 0.5-2.5 (3) MG/3ML SOLN Take 3 mLs by nebulization every 6 (six) hours as needed (shorntess of breath). 360 mL 0   mirtazapine  (REMERON ) 15 MG tablet Take 15 mg by mouth at  bedtime as needed (for panic associated with PTSD).     Multiple Vitamin (MULTIVITAMIN WITH MINERALS) TABS tablet Take 1 tablet by mouth daily.     pantoprazole  (PROTONIX ) 40 MG tablet TAKE 1 TABLET(40 MG) BY MOUTH DAILY 90 tablet 3   REPATHA  SURECLICK 140 MG/ML SOAJ ADMINISTER 1 ML UNDER THE SKIN EVERY 14 DAYS 2 mL 11   tamsulosin  (FLOMAX ) 0.4 MG CAPS capsule TAKE 1 CAPSULE(0.4 MG) BY MOUTH DAILY 30 capsule 3   Tiotropium Bromide  Monohydrate 2.5 MCG/ACT AERS Inhale 2 puffs into the lungs at bedtime. Spiriva      tiZANidine  (ZANAFLEX ) 4  MG capsule Take 1 capsule (4 mg total) by mouth 3 (three) times daily. This can make you sleepy. 60 capsule 0   traMADol  (ULTRAM ) 50 MG tablet Take 1 tablet (50 mg total) by mouth 2 (two) times daily. 60 tablet 0   Current Facility-Administered Medications on File Prior to Visit  Medication Dose Route Frequency Provider Last Rate Last Admin   potassium chloride  10 mEq in 100 mL IVPB  10 mEq Intravenous Once Borders, Joshua R, NP       "

## 2024-08-12 ENCOUNTER — Other Ambulatory Visit: Payer: Self-pay

## 2024-08-15 ENCOUNTER — Other Ambulatory Visit: Payer: Self-pay | Admitting: Pharmacy Technician

## 2024-08-16 ENCOUNTER — Ambulatory Visit: Admitting: Internal Medicine

## 2024-08-16 ENCOUNTER — Other Ambulatory Visit: Payer: Self-pay

## 2024-08-16 ENCOUNTER — Encounter: Payer: Self-pay | Admitting: Internal Medicine

## 2024-08-16 VITALS — BP 130/70 | HR 64 | Temp 99.3°F | Ht 69.0 in | Wt 198.0 lb

## 2024-08-16 DIAGNOSIS — Z87891 Personal history of nicotine dependence: Secondary | ICD-10-CM | POA: Diagnosis not present

## 2024-08-16 DIAGNOSIS — R0689 Other abnormalities of breathing: Secondary | ICD-10-CM

## 2024-08-16 DIAGNOSIS — G4733 Obstructive sleep apnea (adult) (pediatric): Secondary | ICD-10-CM | POA: Diagnosis not present

## 2024-08-16 DIAGNOSIS — J449 Chronic obstructive pulmonary disease, unspecified: Secondary | ICD-10-CM

## 2024-08-16 DIAGNOSIS — I4891 Unspecified atrial fibrillation: Secondary | ICD-10-CM

## 2024-08-16 MED ORDER — ARFORMOTEROL TARTRATE 15 MCG/2ML IN NEBU: 15.0000 ug | INHALATION_SOLUTION | Freq: Two times a day (BID) | RESPIRATORY_TRACT | 10 refills | Status: AC

## 2024-08-16 MED ORDER — BUDESONIDE 0.5 MG/2ML IN SUSP: 0.5000 mg | Freq: Two times a day (BID) | RESPIRATORY_TRACT | 10 refills | Status: AC

## 2024-08-16 NOTE — Progress Notes (Signed)
 "     SYNOPSIS 79 year old male, former smoker. PMH significant for COPD, OSA. Former patient of Dr. Linard, last seen 01/18/2018. Maintained on CPAP for sleep apnea.     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 Probable mild obstruction (FEV1 70% pred) with gas trapping (RV 134% pred) and mild-mod decrease in DLCO PSG (07/25/15): Overall AHI < 10/hr but > 100/hr in supine position. ROV (07/30/15): Dyspnea on exertion is moderately improved with scheduled use of Symbicort . He is also on Spiriva , PRN albuterol  with rare use of albuterol  MDI. PSG results not available. Continue same COPD regimen CPAP titration (08/29/15): recommend full mask with CPAP 8 cm H2O CPAP compliance 11/26-12/25/18: Usage 30/30. Greater than 4 hrs: 27/30.  CPAP 8 cm PREVIOUS AHI 44     RECENT ADMISSION TO [facility]/SYNOPSIS Dyspnea  COPD exacerbation JAN 2026 39 PYHx, quit in 2009. Last PFTs 04/2024 show FEV/FVC ratio 58; DLCO mildly reduced.  Started Ohtuvayre  nebs in October 2025 and notes his wheezing has been controlled.  CXR (-) consolidation and CTA chest (-) PE, (+) mild centrilobular emphysema.   HFpEF  CAD s/p CABG 2010  I Pacemaker 2017  ICM s/p CABG 2010. TTE 11/2023 EF 55% w/ anterior and anteroseptal WMA, G1DD, normal RV systolic function, normal PASP. On eliquis  in lieu of aspirin .     CC Follow up assessment of COPD Follow up assessment of OSA     HPI Regarding COPD Recent exacerbation at [facility] Patient with progressive shortness of breath and dyspnea on exertion of the last several months Patient unable to use traditional inhaler therapy Will need to start patient on nebulized therapy as maintenance therapy Findings are concerning for recurrent hospital  admissions I have asked patient to use a CPAP intermittently throughout the day Ambulating pulse ox in the office did not show hypoxia  Discussed sleep data and reviewed with patient.  Encouraged proper weight management.  Discussed sleep hygiene Patient uses and benefits from therapy Using CPAP nightly and with naps Settings are comfortable and is sleeping well. CPAP 8 Excellent compliance report Previous AHI 44   Patient with atrial fibrillation status post pacemaker Continue amiodarone  therapy Continue oral anticoagulation therapy HFpEF  CAD s/p CABG 2010  I Pacemaker 2017  Recommend follow-up cardiology as soon as possible Continue diuretics as prescribed   Counseling given: Not Answered   Outpatient Medications Prior to Visit  Medication Sig Dispense Refill   acalabrutinib  maleate (CALQUENCE ) 100 MG tablet Take 1 tablet (100 mg total) by mouth 2 (two) times daily. 60 tablet 2   acetaminophen  (TYLENOL ) 325 MG tablet Take 1-2 tablets (325-650 mg total) by mouth every 4 (four) hours as needed for mild pain.     albuterol  (VENTOLIN  HFA) 108 (90 Base) MCG/ACT inhaler Inhale 2 puffs into the lungs every 6 (six) hours as needed for wheezing or shortness of breath. 1 each 0   amiodarone  (PACERONE ) 200 MG tablet TAKE 1/2 TABLET BY MOUTH TWICE DAILY FOR 5 DAYS A WEEK 75 tablet 3   apixaban  (ELIQUIS ) 5 MG TABS tablet Take 1 tablet (5 mg total) by mouth 2 (two) times daily. 90 tablet 3   Apple Cider Vinegar 500 MG TABS Take 1 tablet by mouth daily.     azelastine  (  ASTELIN ) 0.1 % nasal spray Place 2 sprays into both nostrils 2 (two) times daily. Use in each nostril as directed 30 mL 1   cetirizine (ZYRTEC) 10 MG tablet Take 10 mg by mouth daily as needed for allergies.      Cholecalciferol (VITAMIN D3) 250 MCG (10000 UT) capsule Take 10,000 Units by mouth daily.     Coenzyme Q10 (COQ10) 200 MG CAPS Take 200 mg by mouth daily.     cyanocobalamin  (VITAMIN B12) 1000 MCG tablet Take 1,000  mcg by mouth daily.     Ensifentrine  (OHTUVAYRE ) 3 MG/2.5ML SUSP Inhale 2.5 mLs into the lungs 2 (two) times daily.     ezetimibe  (ZETIA ) 10 MG tablet Take 1 tablet (10 mg total) by mouth daily. 90 tablet 3   ferrous sulfate  325 (65 FE) MG EC tablet Take 325 mg by mouth daily.     ipratropium-albuterol  (DUONEB) 0.5-2.5 (3) MG/3ML SOLN Take 3 mLs by nebulization every 6 (six) hours as needed (shorntess of breath). 360 mL 0   mirtazapine  (REMERON ) 15 MG tablet Take 15 mg by mouth at bedtime as needed (for panic associated with PTSD).     Multiple Vitamin (MULTIVITAMIN WITH MINERALS) TABS tablet Take 1 tablet by mouth daily.     pantoprazole  (PROTONIX ) 40 MG tablet TAKE 1 TABLET(40 MG) BY MOUTH DAILY 90 tablet 3   REPATHA  SURECLICK 140 MG/ML SOAJ ADMINISTER 1 ML UNDER THE SKIN EVERY 14 DAYS 2 mL 11   tamsulosin  (FLOMAX ) 0.4 MG CAPS capsule TAKE 1 CAPSULE(0.4 MG) BY MOUTH DAILY 30 capsule 3   Tiotropium Bromide  Monohydrate 2.5 MCG/ACT AERS Inhale 2 puffs into the lungs at bedtime. Spiriva      tiZANidine  (ZANAFLEX ) 4 MG capsule Take 1 capsule (4 mg total) by mouth 3 (three) times daily. This can make you sleepy. 60 capsule 0   traMADol  (ULTRAM ) 50 MG tablet Take 1 tablet (50 mg total) by mouth 2 (two) times daily. 60 tablet 0   Facility-Administered Medications Prior to Visit  Medication Dose Route Frequency Provider Last Rate Last Admin   potassium chloride  10 mEq in 100 mL IVPB  10 mEq Intravenous Once Borders, Fonda R, NP        BP 130/70   Pulse 64   Temp 99.3 F (37.4 C)   Ht 5' 9 (1.753 m)   Wt 198 lb (89.8 kg)   SpO2 95%   BMI 29.24 kg/m      Physical Examination:  General Appearance: +distress EYES EOM intact.   NECK Supple, No JVD Pulmonary: normal breath sounds, No wheezing.  CardiovascularNormal S1,S2.  No m/r/g.   Ext pulses intact, cap refill intact  ALL OTHER ROS ARE NEGATIVE   Lab Results:  CBC    Component Value Date/Time   WBC 6.4 06/20/2024 1312   WBC  7.6 02/08/2024 1703   RBC 3.65 (L) 06/20/2024 1312   HGB 12.1 (L) 06/20/2024 1312   HGB 12.5 (L) 03/11/2021 1136   HCT 37.1 (L) 06/20/2024 1312   HCT 36.9 (L) 03/11/2021 1136   PLT 146 (L) 06/20/2024 1312   PLT 159 03/11/2021 1136   MCV 101.6 (H) 06/20/2024 1312   MCV 100 (H) 03/11/2021 1136   MCV 95 10/24/2014 0837   MCH 33.2 06/20/2024 1312   MCHC 32.6 06/20/2024 1312   RDW 14.6 06/20/2024 1312   RDW 13.5 03/11/2021 1136   RDW 14.6 (H) 10/24/2014 0837   LYMPHSABS 2.4 06/20/2024 1312   LYMPHSABS 0.8 (L) 10/24/2014 9162  MONOABS 0.5 06/20/2024 1312   MONOABS 0.6 10/24/2014 0837   EOSABS 0.1 06/20/2024 1312   EOSABS 0.2 10/24/2014 0837   BASOSABS 0.0 06/20/2024 1312   BASOSABS 0.0 10/24/2014 0837    BMET    Component Value Date/Time   NA 143 06/20/2024 1312   NA 143 03/11/2021 1136   NA 139 10/11/2014 1800   K 4.2 06/20/2024 1312   K 3.3 (L) 10/11/2014 1800   CL 108 06/20/2024 1312   CL 106 10/11/2014 1800   CO2 26 06/20/2024 1312   CO2 27 10/11/2014 1800   GLUCOSE 101 (H) 06/20/2024 1312   GLUCOSE 107 (H) 10/11/2014 1800   BUN 17 06/20/2024 1312   BUN 13 03/11/2021 1136   BUN 15 10/11/2014 1800   CREATININE 0.99 06/20/2024 1312   CREATININE 0.89 10/11/2014 1800   CALCIUM  8.9 06/20/2024 1312   CALCIUM  8.8 (L) 10/11/2014 1800   GFRNONAA >60 06/20/2024 1312   GFRNONAA >60 10/11/2014 1800   GFRAA >60 04/12/2020 0959   GFRAA >60 10/11/2014 1800      ASSESSMENT AND PLAN 79 year old former smoker follow-up assessment for COPD OSA Patient with atrial fibrillation on amiodarone  therapy  Assessment of COPD COPD FEV1 60% predicted moderate Recent COPD exacerbation at [facility] Patient with respiratory insufficiency unable to take traditional inhalers Patient will need Pulmicort  and Brovana  nebulizers for maintenance therapy Continue nebulized phosphodiesterase inhibitor-OHTUVAYRE  Rinse mouth after use Patient with progressive shortness of breath and increased work of  breathing Consider using CPAP intermittently throughout the day Ambulating pulse oximetry in the office today did not show hypoxia  Assessment of OSA Previous AHI 44 Continue CPAP as prescribed  Excellent compliance report Reviewed compliance report in detail with patient Patient definitely benefits the use of CPAP therapy as prescribed Using CPAP nightly and with naps Pressure setting is comfortable and is sleeping well. CPAP prescription 8 AHI reduced to  No evidence of acute heart failure at this time No respiratory distress No fevers, chills, nausea, vomiting, diarrhea No evidence hemoptysis  Patient Instructions Continue to use CPAP every night, minimum of 4-6 hours a night.  Change equipment every 30 days or as directed by DME.  Patient uses nasal pillows   A. fib RVR with amiodarone  therapy HFpEF  CAD s/p CABG 2010  I Pacemaker 2017  Recommend follow-up cardiology as soon as possible Continue diuretics as prescribed     MEDICATION ADJUSTMENTS/LABS AND TESTS ORDERED: Due to respiratory insufficiency patient can no longer take traditional inhaler therapy Start Pulmicort  Start Brovana  Continue DuoNebs as needed Continue amiodarone  Continue CPAP as prescribed Stop Advair Stop Spiriva  Patient is to continue on nebulized phosphodiesterase inhibitor-OHTUVAYRE  Avoid Allergens and Irritants Avoid secondhand smoke Avoid SICK contacts Recommend  Masking  when appropriate Recommend Keep up-to-date with vaccinations   CURRENT MEDICATIONS REVIEWED AT LENGTH WITH PATIENT TODAY   Patient  satisfied with Plan of action and management. All questions answered   Follow up 4 weeks   I spent a total of 55 minutes dedicated to the care of this patient on the date of this encounter to include pre-visit review of records, face-to-face time with the patient discussing conditions above, post visit ordering of testing, clinical documentation with the electronic health record,  making appropriate referrals as documented, and communicating necessary information to the patient's healthcare team.    The Patient requires high complexity decision making for assessment and support, frequent evaluation and titration of therapies, application of advanced monitoring technologies and extensive interpretation of multiple  databases.  Patient satisfied with Plan of action and management. All questions answered    Nickolas Alm Cellar, M.D.  [facility]  Medical Director [facility]      "

## 2024-08-16 NOTE — Patient Instructions (Addendum)
 STOP ADVAIR STOP SPIRIVA   START PULMICORT  NEBS TWICE DAILY START BROVANA  NEBS TWICE DAILY  CAN USE DOUNEBS EVERY 6 HRS AS NEEDED  CAN USE CPAP DURING THE DAYTIME AS NEEDED  RECOMMEND FOLLOW UP WITH CARDIOLOGY  Avoid Allergens and Irritants Avoid secondhand smoke Avoid SICK contacts Recommend  Masking  when appropriate Recommend Keep up-to-date with vaccinations

## 2024-08-18 ENCOUNTER — Encounter: Payer: Self-pay | Admitting: Nurse Practitioner

## 2024-08-18 NOTE — Assessment & Plan Note (Signed)
Follow up with Cardiology as scheduled

## 2024-08-18 NOTE — Assessment & Plan Note (Signed)
 Blood pressure is elevated, likely due to the current illness and shortness of breath. Previously adequately controlled. Not on any medications. We will continue to monitor.

## 2024-08-18 NOTE — Assessment & Plan Note (Signed)
 He was recently hospitalized for an exacerbation with pleural effusion. Symptoms are likely due to COPD exacerbation or a viral infection, worsened by cold weather. Continue prednisone  for 3 more days and use inhalers as prescribed. Monitor oxygen  saturation at home. Follow up with pulmonology on January 27th. Return to the hospital if symptoms worsen or do not improve.

## 2024-08-18 NOTE — Assessment & Plan Note (Signed)
 He is on Eliquis  and amiodarone  and is under cardiology care. Continue Eliquis  and amiodarone . Follow up with cardiology as scheduled.

## 2024-08-19 ENCOUNTER — Telehealth: Payer: Self-pay

## 2024-08-19 NOTE — Telephone Encounter (Signed)
 Call was transferred to me and I spoke with the pt to clarify the medication use and instructed him to take his brovanna neb first, wait about 15 mins, then take his pulmicort  neb afterwards. But do no mix as this can cause foaming with is dangerous. Pt taught it back to me and expressed gratitude for the clarification.  NFN.

## 2024-08-19 NOTE — Telephone Encounter (Signed)
 Copied from CRM #[id number]. Topic: Clinical - Medical Advice >> Aug 19, 2024 10:41 AM Leila BROCKS wrote: Reason for CRM: Patient [phone] states received medications for nebulizer, but there's two different medications: arformoterol  (BROVANA ) 15 MCG/2ML NEBU and budesonide  (PULMICORT ) 0.5 MG/2ML nebulizer solution, to take two times a day, and there's no further instructions on the box. Patient asked if should he mix them together? Patient needs to know how to take it the medications, it toke him 3 days to get it. Per CAL, warm transferred to CAL.

## 2024-08-22 ENCOUNTER — Inpatient Hospital Stay: Admitting: Nurse Practitioner

## 2024-08-22 ENCOUNTER — Inpatient Hospital Stay

## 2024-08-23 NOTE — Telephone Encounter (Signed)
 Copied from CRM [id number]. Topic: Clinical - Order For Equipment >> Aug 22, 2024  2:03 PM [name] wrote: Reason for CRM: Patient needs nurse to call him to advise if he should refill his nebulizer solution, as he still has some from the previous presciption. Patient is requesting to be advised, states he has some confusion about what the specialty pharmacy wants.

## 2024-08-23 NOTE — Telephone Encounter (Signed)
 Copied from CRM [id number]. Topic: Clinical - Order For Equipment >> Aug 23, 2024 11:06 AM [name] wrote: Per patient: He was receiving a drug for his nebulizer from a specialty in [place] . The doctor put him on two different things for this nebulizer. In the meantime, [place]  called him and wants to know when they should send his next batch of nebulizer medication. Patient states he is on something else and stated he would need to check with us  before he could answer them.  This is all of the information that patient could provide. Please reach out to patient for further information, as he does not have names of medications or any further detail he can provide to PAS.

## 2024-08-23 NOTE — Telephone Encounter (Signed)
 I spoke with the patient. The medication he is talking about is the Ohtuvayre .   Per Dr. Jacqulyn last office note- Patient is to continue on nebulized phosphodiesterase inhibitor-OHTUVAYRE   I have notified the patient. Nothing further needed.

## 2024-08-25 ENCOUNTER — Telehealth: Payer: Self-pay

## 2024-08-25 NOTE — Telephone Encounter (Signed)
 Copied from CRM [id number]. Topic: Clinical - Home Health Verbal Orders >> Aug 25, 2024  3:46 PM Emylou G wrote: Caller/Agency: Joey w/[facility] Callback Number: [phone] secured line Service Requested: Physical Therapy Frequency: 1w3 1 every 2 weeks 6 Any new concerns about the patient? No

## 2024-08-25 NOTE — Telephone Encounter (Signed)
 Detailed vm left providing verbal orders for PT

## 2024-08-26 ENCOUNTER — Inpatient Hospital Stay: Attending: Internal Medicine

## 2024-08-26 ENCOUNTER — Encounter: Payer: Self-pay | Admitting: Nurse Practitioner

## 2024-08-26 ENCOUNTER — Inpatient Hospital Stay: Admitting: Nurse Practitioner

## 2024-08-26 VITALS — BP 101/73 | HR 61 | Temp 98.6°F | Resp 20 | Wt 202.9 lb

## 2024-08-26 DIAGNOSIS — R269 Unspecified abnormalities of gait and mobility: Secondary | ICD-10-CM | POA: Insufficient documentation

## 2024-08-26 DIAGNOSIS — C9110 Chronic lymphocytic leukemia of B-cell type not having achieved remission: Secondary | ICD-10-CM

## 2024-08-26 DIAGNOSIS — Z7729 Contact with and (suspected ) exposure to other hazardous substances: Secondary | ICD-10-CM | POA: Insufficient documentation

## 2024-08-26 LAB — CBC WITH DIFFERENTIAL (CANCER CENTER ONLY)
Abs Immature Granulocytes: 0.03 10*3/uL (ref 0.00–0.07)
Basophils Absolute: 0 10*3/uL (ref 0.0–0.1)
Basophils Relative: 0 %
Eosinophils Absolute: 0.1 10*3/uL (ref 0.0–0.5)
Eosinophils Relative: 1 %
HCT: 36.2 % — ABNORMAL LOW (ref 39.0–52.0)
Hemoglobin: 11.9 g/dL — ABNORMAL LOW (ref 13.0–17.0)
Immature Granulocytes: 0 %
Lymphocytes Relative: 41 %
Lymphs Abs: 3.5 10*3/uL (ref 0.7–4.0)
MCH: 32.9 pg (ref 26.0–34.0)
MCHC: 32.9 g/dL (ref 30.0–36.0)
MCV: 100 fL (ref 80.0–100.0)
Monocytes Absolute: 1 10*3/uL (ref 0.1–1.0)
Monocytes Relative: 11 %
Neutro Abs: 4 10*3/uL (ref 1.7–7.7)
Neutrophils Relative %: 47 %
Platelet Count: 146 10*3/uL — ABNORMAL LOW (ref 150–400)
RBC: 3.62 MIL/uL — ABNORMAL LOW (ref 4.22–5.81)
RDW: 14.1 % (ref 11.5–15.5)
WBC Count: 8.7 10*3/uL (ref 4.0–10.5)
nRBC: 0 % (ref 0.0–0.2)

## 2024-08-26 LAB — LACTATE DEHYDROGENASE: LDH: 202 U/L (ref 105–235)

## 2024-08-26 LAB — CMP (CANCER CENTER ONLY)
ALT: 31 U/L (ref 0–44)
AST: 25 U/L (ref 15–41)
Albumin: 4.1 g/dL (ref 3.5–5.0)
Alkaline Phosphatase: 89 U/L (ref 38–126)
Anion gap: 13 (ref 5–15)
BUN: 24 mg/dL — ABNORMAL HIGH (ref 8–23)
CO2: 21 mmol/L — ABNORMAL LOW (ref 22–32)
Calcium: 8.8 mg/dL — ABNORMAL LOW (ref 8.9–10.3)
Chloride: 108 mmol/L (ref 98–111)
Creatinine: 0.98 mg/dL (ref 0.61–1.24)
GFR, Estimated: >60 mL/min
Glucose, Bld: 125 mg/dL — ABNORMAL HIGH (ref 70–99)
Potassium: 4.1 mmol/L (ref 3.5–5.1)
Sodium: 142 mmol/L (ref 135–145)
Total Bilirubin: 0.9 mg/dL (ref 0.0–1.2)
Total Protein: 6.2 g/dL — ABNORMAL LOW (ref 6.5–8.1)

## 2024-08-26 NOTE — Progress Notes (Signed)
 [facility] OFFICE PROGRESS NOTE  Patient Care Team: Gretel App, NP as PCP - General (Nurse Practitioner) Perla Evalene PARAS, MD as PCP - Cardiology (Cardiology) Kennyth Chew, MD as PCP - Electrophysiology (Clinical Cardiac Electrophysiology) Perla Evalene PARAS, MD as Consulting Physician (Cardiology) Rennie Cindy SAUNDERS, MD as Consulting Physician (Oncology) Bula Powell PARAS, RN as Registered Nurse (Oncology) Riddle, Suzann, NP as Nurse Practitioner (Clinical Cardiac Electrophysiology)   Cancer Staging  No matching staging information was found for the patient.  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  MARCH 2017- CT N/C/A/P- NED. STOP Ritux; surveillance   # AUG 2019- CT/PET- progression/NO transformation;  # NOV 2019- Progression; started Ibrutinib  420 mg/d. STOPPED in end of feb sec to extreme fatigue/joint pains/cramps  #November 01, 2018-start ibrutinib  280 mg a day; December 20, 2018-discontinue ibrutinib  secondary multiple side effects.   #January 03, 2019 start Gazyva  + Ven [July]; finished Dec 2020-; started Ven maintenance [200 mg a day; DI-amiodarone ]; SEP 2022- congestive heart failure; s/p cardiac cath-noted to have CAD however no stents placed.  2D echo- EF-55%  #Mid March 2021-venetoclax  dose reduced to 100 mg [second diarrhea]. HELD in DEC 2022/JAN 2023 x3 months-multiple admissions to the hospital/pneumonia infection stroke x 3 m.   # MAY 2023-progression of disease in the neck ches; Nov 18, 2021-RE-started venetoclax ;   # JUNE 29th, 2023-progression of disease in the neck- STOP VENAOCLAX;   # AUG 16th, 2023 START ACALABRUTINIB  100 mg BID [previous intolerance to ibrutinib ; delayed sec to C.diff]    #? 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 Ambulating with cane.    Debby LOISE Musa 79 y.o. 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 He was in the hospital for breathing difficulties and is now taking several nebulizers which help. Denies new lumps or bumps. He has not had any recent falls. Tolerating treatment well and denies complaints today.     Review of Systems  Constitutional:  Positive for malaise/fatigue. Negative for chills, diaphoresis and fever.  HENT:  Negative for nosebleeds and sore throat.   Eyes:  Negative for double vision.  Respiratory:  Negative for hemoptysis, shortness of breath and wheezing.   Cardiovascular:  Negative for chest pain, palpitations, orthopnea and leg swelling.  Gastrointestinal:  Negative for abdominal pain, blood in stool, constipation, heartburn, melena, nausea and vomiting.  Genitourinary:  Negative for dysuria, frequency and urgency.  Musculoskeletal:  Positive for back pain, joint pain and myalgias.  Skin: Negative.  Negative for itching and rash.  Neurological:  Negative for dizziness, tingling, focal weakness and headaches.  Psychiatric/Behavioral:  Negative for depression. The patient is not nervous/anxious and does not have insomnia.  PAST MEDICAL HISTORY :  Past Medical History:  Diagnosis Date   Agent orange exposure    Anxiety     Arthritis    Atrial fibrillation (HCC) 2013   a.) Dx'd in 2013; recurrence 02/2014; b.) CHA2DS2-VASc = 7 (age x2, HFimpEF, HTN, CVA x 2, previous MI); c.) cardiac rate/rhythm maintained on oral amiodarone ; chronically anticoagulated using apixaban    BPH (benign prostatic hyperplasia)    C. 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)    a.) MV 08/23/2015: EF 50%; b.) TTE 08/23/2015: EF 55-60%, G1DD; c.) TTE 11/02/2015: EF 55-60%, mild LA dil; d.) TTE 02/04/2020: EF 55-60%, mild LVH, G1DD, PASP 44.3, mild-mod AoV sclerosis; e.) MV 12/25/2020: EF 45-54%; f.)  R/LHC 02/04/2021: mRA 13, mPA 27, mPCWP 22, LVEDP 23, Ao sat 99, PA sat 70, CO 6.5, CI 3.0; g.) TTE 02/19/2021: EF 55-60%, G1DD, mildly red RVSF, triv MR, mild-mod AoV scler   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 EF 55%, mild antlat HK;  c.) s/p R/LHC 02/04/2021: LM mild diff dzs, LAD 100p, LCX nl, RCA 50p, 95d, 99 side branch, RPAV fills via LCx. SVG->RPDA 100, SVG->OM1 100, LIMA->LAD patent. EF 35-45%.   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    Hyperlipidemia LDL goal <70    Hypertension    Hypokalemia     Ischemic cardiomyopathy    a.) MV 08/23/2015: EF 50%; b.) TTE 08/23/2015: EF 55-60%; c.) TTE 11/02/2015: EF 55-60%; d.) TTE 02/04/2020: EF 55-60%; e.) MV 12/25/2020: EF 45-54%; f.) R/LHC 02/04/2021: mRA 13, mPA 27, mPCWP 22, LVEDP 23, Ao sat 99, PA sat 70, CO 6.5, CI 3.0; g.) TTE 02/19/2021: EF 55-60%   Ischemic cerebrovascular accident (CVA) (HCC) 10/18/2021   Myocardial infarction (HCC) 2010   On amiodarone  therapy    On apixaban  therapy    OSA on CPAP    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   TIA (transient ischemic attack) 11/02/2015   PAST SURGICAL HISTORY :   Past Surgical  History:  Procedure Laterality Date   ABDOMINAL HERNIA REPAIR     APPENDECTOMY  06/21/1985   CORONARY ARTERY BYPASS GRAFT N/A 05/09/2009   EP IMPLANTABLE DEVICE N/A 03/03/2016   Procedure: Pacemaker Implant;  Surgeon: Elspeth JAYSON Sage, MD;  Location[facility] INVASIVE CV LAB;  Service: Cardiovascular;  Laterality: N/A;   FOREIGN BODY REMOVAL  1968   shrapnel in my tailbone   INGUINAL HERNIA REPAIR Right    JOINT REPLACEMENT Right 2018   LAPAROSCOPIC CHOLECYSTECTOMY     LEFT HEART CATH AND CORONARY ANGIOGRAPHY Left 05/08/2009   Procedure: LEFT HEART CATH AND CORONARY ANGIOGRAPHY; Location: [facility]: Surgeon: Darron Grass, MD   LEFT HEART CATH AND CORS/GRAFTS ANGIOGRAPHY Left 10/17/2009   Procedure: LEFT HEART CATH AND CORS/GRAFTS ANGIOGRAPHY;  Location: [facility]; Surgeon: Darron Grass, MD   RIGHT/LEFT HEART CATH AND CORONARY/GRAFT ANGIOGRAPHY N/A 02/04/2021   Procedure: RIGHT/LEFT HEART CATH AND CORONARY/GRAFT ANGIOGRAPHY;  Surgeon: Mady Bruckner, MD;  Location: [facility] INVASIVE CV LAB;  Service: Cardiovascular;  Laterality: N/A;   TONSILLECTOMY AND ADENOIDECTOMY  1956   TOTAL HIP ARTHROPLASTY Right 10/22/2016   Procedure: TOTAL HIP ARTHROPLASTY;  Surgeon: Lynwood SHAUNNA Hue, MD;  Location: [facility] ORS;  Service: Orthopedics;  Laterality: Right;   TOTAL HIP ARTHROPLASTY Left 11/04/2017   Procedure: TOTAL HIP ARTHROPLASTY;  Surgeon: Hue Lynwood SHAUNNA, MD;  Location: [facility] ORS;  Service: Orthopedics;  Laterality: Left;    FAMILY HISTORY :   Family History  Problem Relation Age of Onset   Heart disease Mother    Heart attack Mother    Coronary artery disease Other        family history    SOCIAL HISTORY:   Social History   Tobacco Use   Smoking status: Former    Current packs/day: 0.00    Average packs/day: 1 pack/day for 40.0 years (40.0 ttl pk-yrs)    Types: Cigarettes    Start date: 07/21/1966    Quit date: 07/21/2006    Years since quitting: 18.1   Smokeless tobacco: Never  Vaping Use   Vaping status: Former  Substance Use Topics   Alcohol use: Not Currently   Drug use: No    ALLERGIES:  is allergic to atorvastatin  and augmentin  [amoxicillin -pot clavulanate].  MEDICATIONS:  Current Outpatient Medications  Medication Sig Dispense Refill   acalabrutinib  maleate (CALQUENCE ) 100 MG tablet Take 1 tablet (100 mg total) by mouth 2 (two) times daily. 60 tablet 2   acetaminophen  (TYLENOL ) 325 MG tablet Take 1-2 tablets (325-650 mg total) by mouth every 4 (four) hours as needed for mild pain.     albuterol  (VENTOLIN  HFA) 108 (90 Base) MCG/ACT inhaler Inhale 2 puffs into the lungs every 6 (six) hours as needed for wheezing or shortness of breath. 1 each 0   amiodarone  (PACERONE ) 200 MG tablet TAKE 1/2 TABLET BY MOUTH TWICE DAILY FOR 5  DAYS A WEEK 75 tablet 3   apixaban  (ELIQUIS ) 5 MG TABS tablet Take 1 tablet (5 mg total) by mouth 2 (two) times daily. 90 tablet 3   Apple Cider Vinegar 500 MG TABS Take 1 tablet by mouth daily.     arformoterol  (BROVANA ) 15 MCG/2ML NEBU Take 2 mLs (15 mcg total) by nebulization 2 (two) times daily. 120 mL 10   azelastine  (ASTELIN ) 0.1 % nasal spray Place 2 sprays into both nostrils 2 (two) times daily. Use in each nostril as directed 30 mL 1   budesonide  (PULMICORT ) 0.5 MG/2ML nebulizer solution Take 2 mLs (0.5 mg total)  by nebulization 2 (two) times daily. 1440 mL 10   cetirizine (ZYRTEC) 10 MG tablet Take 10 mg by mouth daily as needed for allergies.      Cholecalciferol (VITAMIN D3) 250 MCG (10000 UT) capsule Take 10,000 Units by mouth daily.     Coenzyme Q10 (COQ10) 200 MG CAPS Take 200 mg by mouth daily.     cyanocobalamin  (VITAMIN B12) 1000 MCG tablet Take 1,000 mcg by mouth daily.     Ensifentrine  (OHTUVAYRE ) 3 MG/2.5ML SUSP Inhale 2.5 mLs into the lungs 2 (two) times daily.     ezetimibe  (ZETIA ) 10 MG tablet Take 1 tablet (10 mg total) by mouth daily. 90 tablet 3   ferrous sulfate  325 (65 FE) MG EC tablet Take 325 mg by mouth daily.     ipratropium-albuterol  (DUONEB) 0.5-2.5 (3) MG/3ML SOLN Take 3 mLs by nebulization every 6 (six) hours as needed (shorntess of breath). 360 mL 0   mirtazapine  (REMERON ) 15 MG tablet Take 15 mg by mouth at bedtime as needed (for panic associated with PTSD).     Multiple Vitamin (MULTIVITAMIN WITH MINERALS) TABS tablet Take 1 tablet by mouth daily.     pantoprazole  (PROTONIX ) 40 MG tablet TAKE 1 TABLET(40 MG) BY MOUTH DAILY 90 tablet 3   predniSONE  (DELTASONE ) 20 MG tablet Take 20 mg by mouth 2 (two) times daily.     REPATHA  SURECLICK 140 MG/ML SOAJ ADMINISTER 1 ML UNDER THE SKIN EVERY 14 DAYS 2 mL 11   tamsulosin  (FLOMAX ) 0.4 MG CAPS capsule TAKE 1 CAPSULE(0.4 MG) BY MOUTH DAILY 30 capsule 3   tiZANidine  (ZANAFLEX ) 4 MG capsule Take 1 capsule (4 mg total)  by mouth 3 (three) times daily. This can make you sleepy. 60 capsule 0   traMADol  (ULTRAM ) 50 MG tablet Take 1 tablet (50 mg total) by mouth 2 (two) times daily. 60 tablet 0   fluticasone -salmeterol (ADVAIR) 500-50 MCG/ACT AEPB Inhale 1 puff into the lungs in the morning and at bedtime. (Patient not taking: Reported on 08/26/2024)     Tiotropium Bromide  Monohydrate 2.5 MCG/ACT AERS Inhale 2 puffs into the lungs at bedtime. Spiriva  (Patient not taking: Reported on 08/26/2024)     No current facility-administered medications for this visit.   Facility-Administered Medications Ordered in Other Visits  Medication Dose Route Frequency Provider Last Rate Last Admin   potassium chloride  10 mEq in 100 mL IVPB  10 mEq Intravenous Once Borders, Fonda SAUNDERS, NP        PHYSICAL EXAMINATION: ECOG PERFORMANCE STATUS: 1 - Symptomatic but completely ambulatory  BP 101/73   Pulse 61   Temp 98.6 F (37 C)   Resp 20   Wt 202 lb 14.4 oz (92 kg)   SpO2 100%   BMI 29.96 kg/m   Filed Weights   08/26/24 1438  Weight: 202 lb 14.4 oz (92 kg)    Physical Exam Constitutional:      Appearance: He is not ill-appearing.  HENT:     Head: Normocephalic and atraumatic.     Mouth/Throat:     Pharynx: No oropharyngeal exudate.  Pulmonary:     Effort: No respiratory distress.     Breath sounds: No wheezing.  Abdominal:     General: There is no distension.     Palpations: Abdomen is soft.     Tenderness: There is no abdominal tenderness. There is no guarding.  Musculoskeletal:        General: No tenderness.     Comments: Ambulating with walking  stick  Skin:    General: Skin is warm.     Coloration: Skin is not pale.  Neurological:     Mental Status: He is alert and oriented to person, place, and time.  Psychiatric:        Mood and Affect: Mood and affect normal.        Behavior: Behavior normal.     LABORATORY DATA:  I have reviewed the data as listed    Component Value Date/Time   NA 142 08/26/2024  1415   NA 143 03/11/2021 1136   NA 139 10/11/2014 1800   K 4.1 08/26/2024 1415   K 3.3 (L) 10/11/2014 1800   CL 108 08/26/2024 1415   CL 106 10/11/2014 1800   CO2 21 (L) 08/26/2024 1415   CO2 27 10/11/2014 1800   GLUCOSE 125 (H) 08/26/2024 1415   GLUCOSE 107 (H) 10/11/2014 1800   BUN 24 (H) 08/26/2024 1415   BUN 13 03/11/2021 1136   BUN 15 10/11/2014 1800   CREATININE 0.98 08/26/2024 1415   CREATININE 0.89 10/11/2014 1800   CALCIUM  8.8 (L) 08/26/2024 1415   CALCIUM  8.8 (L) 10/11/2014 1800   PROT 6.2 (L) 08/26/2024 1415   PROT 6.7 05/18/2017 1048   PROT 6.4 (L) 10/11/2014 1800   ALBUMIN  4.1 08/26/2024 1415   ALBUMIN  4.3 05/18/2017 1048   ALBUMIN  4.1 10/11/2014 1800   AST 25 08/26/2024 1415   ALT 31 08/26/2024 1415   ALT 22 10/11/2014 1800   ALKPHOS 89 08/26/2024 1415   ALKPHOS 61 10/11/2014 1800   BILITOT 0.9 08/26/2024 1415   GFRNONAA >60 08/26/2024 1415   GFRNONAA >60 10/11/2014 1800   GFRAA >60 04/12/2020 0959   GFRAA >60 10/11/2014 1800   Lab Results  Component Value Date   WBC 8.7 08/26/2024   NEUTROABS 4.0 08/26/2024   HGB 11.9 (L) 08/26/2024   HCT 36.2 (L) 08/26/2024   MCV 100.0 08/26/2024   PLT 146 (L) 08/26/2024     Chemistry      Component Value Date/Time   NA 142 08/26/2024 1415   NA 143 03/11/2021 1136   NA 139 10/11/2014 1800   K 4.1 08/26/2024 1415   K 3.3 (L) 10/11/2014 1800   CL 108 08/26/2024 1415   CL 106 10/11/2014 1800   CO2 21 (L) 08/26/2024 1415   CO2 27 10/11/2014 1800   BUN 24 (H) 08/26/2024 1415   BUN 13 03/11/2021 1136   BUN 15 10/11/2014 1800   CREATININE 0.98 08/26/2024 1415   CREATININE 0.89 10/11/2014 1800      Component Value Date/Time   CALCIUM  8.8 (L) 08/26/2024 1415   CALCIUM  8.8 (L) 10/11/2014 1800   ALKPHOS 89 08/26/2024 1415   ALKPHOS 61 10/11/2014 1800   AST 25 08/26/2024 1415   ALT 31 08/26/2024 1415   ALT 22 10/11/2014 1800   BILITOT 0.9 08/26/2024 1415      RADIOGRAPHIC STUDIES: I have personally  reviewed the radiological images as listed and agreed with the findings in the report. No results found.    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  Patient currently on Acalabrutnib [AUG 16th, 2023]-  Patient admits to compliance- SEP 16th, 2025- NECK- CT AP Significant interval improvement of retroperitoneal lymphadenopathy, including decreased size of left retroperitoneal and right retrocaval nodes, and resolution of right external iliac lymphadenopathy.   # Clinically stable; no evidence of any progression - admit compliance. Continue acalabrutinib - stable.  CT scans ordered.    # Neck pain/Joint pains/Muscle pain- s/p evaluation with Dr.Chasnis/Dr.Brandon Claudene; [facility]. S/p surgery [AUG 2024]- stable.    # Easy bruising [posterior Legs] sec Eliquis  + Acalabrutinib - stable.   # Mild to moderate anemia:[>>> Colo] Continue gentle iron/slow iron.Hb- 12.3- improving- stable    # Low immunoglobulins IgG 330/Hx  severe respiratory infection/CLL-s/pI IVIG #4/4 [last April 2022 -awaiting immunoglobulins- stable    # COPD [Dr. Kasa]- chronic cough sputum.  No clinical evidence of any acute exacerbation at this time- stable    # A.fib-on amiodarone  [ Dr.Gollan] sinus rhythm-on Eliquis /CHF- lasix  40/d- s/p Cath [AUG, 2022- Echo-55%] stable    # Left sided CVA- [s/p rehab; April 2023]- stable   # Vaccination: s/p Flu shot [2024]; recommend COVID/RSV   45m  # DISPOSITION: 2 months- labs (cbc, cmp, ldh, immunoglobulins), Dr Rennie with CT scans week prior- [place]    No problem-specific Assessment & Plan notes found for this encounter.  Orders Placed This Encounter  Procedures   CT CHEST ABDOMEN PELVIS W CONTRAST    Standing Status:   Future    Expected Date:   10/17/2024    Expiration Date:   08/26/2025    If indicated for the ordered procedure, I authorize the administration of contrast media per Radiology  protocol:   Yes    Does the patient have a contrast media/X-ray dye allergy?:   No    Preferred imaging location?:   [facility]    If indicated for the ordered procedure, I authorize the administration of oral contrast media per Radiology protocol:   Yes   CT SOFT TISSUE NECK W CONTRAST    Standing Status:   Future    Expected Date:   10/17/2024    Expiration Date:   08/26/2025    If indicated for the ordered procedure, I authorize the administration of contrast media per Radiology protocol:   Yes    Preferred imaging location?:   [facility]   CBC with Differential (Cancer Center Only)    Standing Status:   Future    Expected Date:   10/24/2024    Expiration Date:   01/22/2025   CMP (Cancer Center only)    Standing Status:   Future    Expected Date:   10/24/2024    Expiration Date:   01/22/2025   Lactate dehydrogenase    Standing Status:   Future    Expected Date:   10/24/2024    Expiration Date:   01/22/2025   Immunoglobulins, QN, A/E/G/M    Standing Status:   Future    Expected Date:   10/24/2024    Expiration Date:   01/22/2025   All questions were answered. The patient knows to call the clinic with any problems, questions or concerns.     Tinnie KANDICE Dawn, NP 08/26/2024

## 2024-09-15 ENCOUNTER — Ambulatory Visit: Admitting: Internal Medicine

## 2024-09-19 ENCOUNTER — Ambulatory Visit: Payer: No Typology Code available for payment source

## 2024-10-17 ENCOUNTER — Encounter: Admitting: Cardiology

## 2024-10-31 ENCOUNTER — Inpatient Hospital Stay: Admitting: Internal Medicine

## 2024-10-31 ENCOUNTER — Inpatient Hospital Stay

## 2024-11-01 ENCOUNTER — Ambulatory Visit

## 2025-01-31 ENCOUNTER — Ambulatory Visit

## 2025-02-08 ENCOUNTER — Ambulatory Visit: Admitting: Nurse Practitioner

## 2025-05-02 ENCOUNTER — Ambulatory Visit

## 2025-08-01 ENCOUNTER — Ambulatory Visit
